# Patient Record
Sex: Male | Born: 1935 | Race: White | Hispanic: No | Marital: Married | Smoking: Former smoker
Health system: Southern US, Community
[De-identification: ages and names within clinical notes are randomized; demographics above are authoritative.]

## PROBLEM LIST (undated history)

## (undated) DIAGNOSIS — R296 Repeated falls: Secondary | ICD-10-CM

## (undated) DIAGNOSIS — Z972 Presence of dental prosthetic device (complete) (partial): Secondary | ICD-10-CM

## (undated) DIAGNOSIS — I10 Essential (primary) hypertension: Secondary | ICD-10-CM

## (undated) DIAGNOSIS — W19XXXA Unspecified fall, initial encounter: Secondary | ICD-10-CM

## (undated) DIAGNOSIS — N200 Calculus of kidney: Secondary | ICD-10-CM

## (undated) DIAGNOSIS — I5032 Chronic diastolic (congestive) heart failure: Secondary | ICD-10-CM

## (undated) DIAGNOSIS — J449 Chronic obstructive pulmonary disease, unspecified: Secondary | ICD-10-CM

## (undated) DIAGNOSIS — G5793 Unspecified mononeuropathy of bilateral lower limbs: Secondary | ICD-10-CM

## (undated) DIAGNOSIS — Z87442 Personal history of urinary calculi: Secondary | ICD-10-CM

## (undated) DIAGNOSIS — E785 Hyperlipidemia, unspecified: Secondary | ICD-10-CM

## (undated) DIAGNOSIS — E119 Type 2 diabetes mellitus without complications: Secondary | ICD-10-CM

## (undated) DIAGNOSIS — C801 Malignant (primary) neoplasm, unspecified: Secondary | ICD-10-CM

## (undated) DIAGNOSIS — M1990 Unspecified osteoarthritis, unspecified site: Secondary | ICD-10-CM

## (undated) DIAGNOSIS — I482 Chronic atrial fibrillation, unspecified: Secondary | ICD-10-CM

## (undated) DIAGNOSIS — I6350 Cerebral infarction due to unspecified occlusion or stenosis of unspecified cerebral artery: Secondary | ICD-10-CM

## (undated) DIAGNOSIS — I2510 Atherosclerotic heart disease of native coronary artery without angina pectoris: Secondary | ICD-10-CM

## (undated) DIAGNOSIS — R2689 Other abnormalities of gait and mobility: Secondary | ICD-10-CM

## (undated) DIAGNOSIS — R29898 Other symptoms and signs involving the musculoskeletal system: Secondary | ICD-10-CM

## (undated) DIAGNOSIS — K219 Gastro-esophageal reflux disease without esophagitis: Secondary | ICD-10-CM

## (undated) HISTORY — DX: Unspecified mononeuropathy of bilateral lower limbs: G57.93

## (undated) HISTORY — DX: Atherosclerotic heart disease of native coronary artery without angina pectoris: I25.10

## (undated) HISTORY — DX: Chronic obstructive pulmonary disease, unspecified: J44.9

## (undated) HISTORY — DX: Chronic atrial fibrillation, unspecified: I48.20

## (undated) HISTORY — DX: Malignant (primary) neoplasm, unspecified: C80.1

## (undated) HISTORY — DX: Calculus of kidney: N20.0

## (undated) HISTORY — DX: Hyperlipidemia, unspecified: E78.5

## (undated) HISTORY — PX: OTHER SURGICAL HISTORY: SHX169

## (undated) HISTORY — PX: BLADDER SURGERY: SHX569

## (undated) HISTORY — DX: Cerebral infarction due to unspecified occlusion or stenosis of unspecified cerebral artery: I63.50

## (undated) HISTORY — DX: Type 2 diabetes mellitus without complications: E11.9

## (undated) HISTORY — DX: Chronic diastolic (congestive) heart failure: I50.32

## (undated) HISTORY — DX: Personal history of urinary calculi: Z87.442

## (undated) HISTORY — DX: Unspecified osteoarthritis, unspecified site: M19.90

## (undated) HISTORY — DX: Essential (primary) hypertension: I10

## (undated) HISTORY — DX: Gastro-esophageal reflux disease without esophagitis: K21.9

---

## 1957-04-12 HISTORY — PX: TONSILLECTOMY AND ADENOIDECTOMY: SHX28

## 1995-04-13 HISTORY — PX: CORONARY ANGIOPLASTY: SHX604

## 1995-04-13 HISTORY — PX: CARDIAC CATHETERIZATION: SHX172

## 1995-04-13 HISTORY — PX: CAROTID STENT INSERTION: SHX5766

## 1999-10-09 ENCOUNTER — Ambulatory Visit (HOSPITAL_COMMUNITY): Admission: RE | Admit: 1999-10-09 | Discharge: 1999-10-09 | Payer: Self-pay

## 2004-06-15 ENCOUNTER — Ambulatory Visit: Payer: Self-pay | Admitting: Cardiology

## 2004-07-09 ENCOUNTER — Ambulatory Visit: Payer: Self-pay | Admitting: Internal Medicine

## 2004-10-09 ENCOUNTER — Ambulatory Visit: Payer: Self-pay | Admitting: Cardiology

## 2005-01-13 ENCOUNTER — Ambulatory Visit: Payer: Self-pay | Admitting: Internal Medicine

## 2005-02-17 ENCOUNTER — Ambulatory Visit: Payer: Self-pay | Admitting: Internal Medicine

## 2005-03-09 ENCOUNTER — Ambulatory Visit: Payer: Self-pay | Admitting: Internal Medicine

## 2005-03-12 ENCOUNTER — Ambulatory Visit: Payer: Self-pay | Admitting: Internal Medicine

## 2005-04-12 ENCOUNTER — Ambulatory Visit: Payer: Self-pay | Admitting: Internal Medicine

## 2005-05-20 ENCOUNTER — Ambulatory Visit: Payer: Self-pay | Admitting: Internal Medicine

## 2005-05-25 ENCOUNTER — Ambulatory Visit: Payer: Self-pay | Admitting: Internal Medicine

## 2005-06-10 ENCOUNTER — Ambulatory Visit: Payer: Self-pay | Admitting: Internal Medicine

## 2005-06-10 ENCOUNTER — Ambulatory Visit: Payer: Self-pay | Admitting: Cardiology

## 2005-06-18 ENCOUNTER — Ambulatory Visit: Payer: Self-pay

## 2005-07-11 ENCOUNTER — Ambulatory Visit: Payer: Self-pay | Admitting: Internal Medicine

## 2005-08-10 ENCOUNTER — Ambulatory Visit: Payer: Self-pay | Admitting: Internal Medicine

## 2005-09-23 ENCOUNTER — Ambulatory Visit: Payer: Self-pay | Admitting: Internal Medicine

## 2005-10-08 ENCOUNTER — Emergency Department: Payer: Self-pay | Admitting: Emergency Medicine

## 2005-10-10 ENCOUNTER — Ambulatory Visit: Payer: Self-pay | Admitting: Internal Medicine

## 2005-11-10 ENCOUNTER — Ambulatory Visit: Payer: Self-pay | Admitting: Internal Medicine

## 2006-01-21 ENCOUNTER — Ambulatory Visit: Payer: Self-pay | Admitting: Internal Medicine

## 2006-02-01 ENCOUNTER — Ambulatory Visit: Payer: Self-pay | Admitting: Specialist

## 2006-02-10 ENCOUNTER — Ambulatory Visit: Payer: Self-pay | Admitting: Internal Medicine

## 2006-03-12 ENCOUNTER — Ambulatory Visit: Payer: Self-pay | Admitting: Internal Medicine

## 2006-04-01 ENCOUNTER — Ambulatory Visit: Payer: Self-pay | Admitting: Cardiology

## 2006-04-12 ENCOUNTER — Ambulatory Visit: Payer: Self-pay | Admitting: Internal Medicine

## 2006-04-13 ENCOUNTER — Encounter: Payer: Self-pay | Admitting: Cardiology

## 2006-04-13 ENCOUNTER — Ambulatory Visit: Payer: Self-pay

## 2006-05-13 ENCOUNTER — Ambulatory Visit: Payer: Self-pay | Admitting: Internal Medicine

## 2006-05-31 ENCOUNTER — Ambulatory Visit: Payer: Self-pay | Admitting: Internal Medicine

## 2006-06-17 ENCOUNTER — Ambulatory Visit: Payer: Self-pay | Admitting: Internal Medicine

## 2006-07-12 ENCOUNTER — Ambulatory Visit: Payer: Self-pay | Admitting: Internal Medicine

## 2006-08-11 ENCOUNTER — Ambulatory Visit: Payer: Self-pay | Admitting: Internal Medicine

## 2006-09-11 ENCOUNTER — Ambulatory Visit: Payer: Self-pay | Admitting: Internal Medicine

## 2006-09-29 ENCOUNTER — Ambulatory Visit: Payer: Self-pay | Admitting: Cardiology

## 2006-10-11 ENCOUNTER — Ambulatory Visit: Payer: Self-pay | Admitting: Internal Medicine

## 2006-11-11 ENCOUNTER — Ambulatory Visit: Payer: Self-pay | Admitting: Internal Medicine

## 2006-12-12 ENCOUNTER — Ambulatory Visit: Payer: Self-pay | Admitting: Internal Medicine

## 2007-01-24 ENCOUNTER — Ambulatory Visit: Payer: Self-pay | Admitting: Family

## 2007-01-24 ENCOUNTER — Encounter: Payer: Self-pay | Admitting: Internal Medicine

## 2007-02-02 ENCOUNTER — Ambulatory Visit: Payer: Self-pay | Admitting: Internal Medicine

## 2007-02-11 ENCOUNTER — Encounter: Payer: Self-pay | Admitting: Internal Medicine

## 2007-02-11 ENCOUNTER — Ambulatory Visit: Payer: Self-pay | Admitting: Internal Medicine

## 2007-03-13 ENCOUNTER — Ambulatory Visit: Payer: Self-pay | Admitting: Internal Medicine

## 2007-03-13 ENCOUNTER — Encounter: Payer: Self-pay | Admitting: Internal Medicine

## 2007-03-16 ENCOUNTER — Ambulatory Visit: Payer: Self-pay | Admitting: Internal Medicine

## 2007-04-13 ENCOUNTER — Ambulatory Visit: Payer: Self-pay | Admitting: Internal Medicine

## 2007-04-21 ENCOUNTER — Ambulatory Visit: Payer: Self-pay | Admitting: Cardiology

## 2007-05-08 ENCOUNTER — Ambulatory Visit: Payer: Self-pay | Admitting: Family

## 2007-08-08 ENCOUNTER — Ambulatory Visit: Payer: Self-pay | Admitting: Family

## 2007-08-23 ENCOUNTER — Ambulatory Visit: Payer: Self-pay | Admitting: Internal Medicine

## 2007-11-29 ENCOUNTER — Ambulatory Visit: Payer: Self-pay | Admitting: Pain Medicine

## 2007-12-01 ENCOUNTER — Ambulatory Visit: Payer: Self-pay | Admitting: Family

## 2007-12-01 ENCOUNTER — Ambulatory Visit: Payer: Self-pay | Admitting: Pain Medicine

## 2007-12-20 ENCOUNTER — Ambulatory Visit: Payer: Self-pay | Admitting: Pain Medicine

## 2008-01-08 ENCOUNTER — Ambulatory Visit: Payer: Self-pay | Admitting: Pain Medicine

## 2008-01-24 ENCOUNTER — Ambulatory Visit: Payer: Self-pay | Admitting: Pain Medicine

## 2008-02-14 ENCOUNTER — Encounter: Payer: Self-pay | Admitting: Neurosurgery

## 2008-02-28 ENCOUNTER — Ambulatory Visit: Payer: Self-pay | Admitting: Pain Medicine

## 2008-02-29 ENCOUNTER — Ambulatory Visit: Payer: Self-pay | Admitting: Cardiology

## 2008-03-20 ENCOUNTER — Encounter: Payer: Self-pay | Admitting: Neurosurgery

## 2008-04-10 ENCOUNTER — Ambulatory Visit: Payer: Self-pay | Admitting: Family

## 2008-04-12 ENCOUNTER — Encounter: Payer: Self-pay | Admitting: Neurosurgery

## 2008-04-17 ENCOUNTER — Ambulatory Visit: Payer: Self-pay | Admitting: Pain Medicine

## 2008-06-20 ENCOUNTER — Ambulatory Visit: Payer: Self-pay | Admitting: Family

## 2008-07-16 ENCOUNTER — Ambulatory Visit: Payer: Self-pay | Admitting: Physician Assistant

## 2008-09-25 ENCOUNTER — Ambulatory Visit: Payer: Self-pay | Admitting: Family

## 2008-10-15 ENCOUNTER — Ambulatory Visit: Payer: Self-pay | Admitting: Physician Assistant

## 2008-12-03 ENCOUNTER — Ambulatory Visit: Payer: Self-pay | Admitting: Specialist

## 2008-12-06 ENCOUNTER — Encounter (INDEPENDENT_AMBULATORY_CARE_PROVIDER_SITE_OTHER): Payer: Self-pay | Admitting: *Deleted

## 2009-01-02 ENCOUNTER — Ambulatory Visit: Payer: Self-pay | Admitting: Family

## 2009-01-07 ENCOUNTER — Ambulatory Visit: Payer: Self-pay | Admitting: Physician Assistant

## 2009-02-26 DIAGNOSIS — E119 Type 2 diabetes mellitus without complications: Secondary | ICD-10-CM

## 2009-02-26 DIAGNOSIS — I639 Cerebral infarction, unspecified: Secondary | ICD-10-CM

## 2009-02-26 DIAGNOSIS — I080 Rheumatic disorders of both mitral and aortic valves: Secondary | ICD-10-CM

## 2009-02-26 DIAGNOSIS — I25118 Atherosclerotic heart disease of native coronary artery with other forms of angina pectoris: Secondary | ICD-10-CM

## 2009-02-26 DIAGNOSIS — I219 Acute myocardial infarction, unspecified: Secondary | ICD-10-CM | POA: Insufficient documentation

## 2009-02-26 DIAGNOSIS — R609 Edema, unspecified: Secondary | ICD-10-CM | POA: Insufficient documentation

## 2009-02-26 DIAGNOSIS — I482 Chronic atrial fibrillation, unspecified: Secondary | ICD-10-CM

## 2009-02-26 DIAGNOSIS — E785 Hyperlipidemia, unspecified: Secondary | ICD-10-CM | POA: Insufficient documentation

## 2009-02-26 DIAGNOSIS — I10 Essential (primary) hypertension: Secondary | ICD-10-CM

## 2009-02-27 ENCOUNTER — Ambulatory Visit: Payer: Self-pay | Admitting: Cardiology

## 2009-04-09 ENCOUNTER — Ambulatory Visit: Payer: Self-pay | Admitting: Family

## 2009-04-10 ENCOUNTER — Ambulatory Visit: Payer: Self-pay | Admitting: Physician Assistant

## 2009-07-30 ENCOUNTER — Ambulatory Visit: Payer: Self-pay | Admitting: Family

## 2009-10-15 ENCOUNTER — Ambulatory Visit: Payer: Self-pay | Admitting: Family

## 2010-01-23 ENCOUNTER — Ambulatory Visit: Payer: Self-pay | Admitting: Family

## 2010-02-19 ENCOUNTER — Ambulatory Visit: Payer: Self-pay | Admitting: Family

## 2010-02-24 ENCOUNTER — Encounter: Payer: Self-pay | Admitting: Cardiology

## 2010-02-24 ENCOUNTER — Ambulatory Visit: Payer: Self-pay | Admitting: Cardiology

## 2010-05-12 NOTE — Assessment & Plan Note (Signed)
Summary: F1Y/DM   Visit Type:  2 week follow up  CC:  sob.  History of Present Illness: [name] is a pleasant gentleman, who has a history of coronary artery disease, status post myocardial infarction treated with TPA at [facility] in January 29, 1996.  His most recent Myoview in March 2007 showed an ejection fraction of 50% with an old scar at the apex and mild peri-infarct ischemia.  Most recent echocardiogram in January 2008 showed an ejection fraction at the lower limits of normal with moderate LVH, mild aortic root dilatation, and mild mitral regurgitation.  There was biatrial enlargement. Previous carotid Dopplers in January of 2008 showed 0-39% stenosis bilaterally.  I last saw him in November of 2010. Since then he has dyspnea with more extreme activities but not with routine activities. It is relieved with rest. It is not associated with chest pain. There is no orthopnea or PND but does occasional mild pedal edema. He has not had palpitations, syncope or bleeding.  Current Medications (verified): 1)  Aspirin 81 Mg Tbec (Aspirin) .... Take One Tablet By Mouth Daily 2)  Lumigan 0.03 % Soln (Bimatoprost) .... As Directed 3)  Isosorbide Dinitrate Cr 40 Mg Cr-Tabs (Isosorbide Dinitrate) .... Take One Capsule By Mouth Three Times A Day 4)  Spiriva Handihaler 18 Mcg Caps (Tiotropium Bromide Monohydrate) .... As Directed 5)  Klor-Con 10 10 Meq Cr-Tabs (Potassium Chloride) .... Alternating 2 One Day Then 3 The Next Day 6)  Advair Diskus 100-50 Mcg/dose Aepb (Fluticasone-Salmeterol) .... As Directed 7)  Proair Hfa 108 (90 Base) Mcg/act Aers (Albuterol Sulfate) .... As Directed 8)  Glimepiride 2 Mg Tabs (Glimepiride) .Marland Kitchen.. 1 Tab By Mouth Two Times A Day 9)  Lipitor 40 Mg Tabs (Atorvastatin Calcium) .... Take One Tablet By Mouth Daily. 10)  Lisinopril 40 Mg Tabs (Lisinopril) .... Take One Tablet By Mouth Daily 11)  Metformin Hcl 1000 Mg Tabs (Metformin Hcl) .Marland Kitchen.. 1 Tab By Mouth Two Times A  Day 12)  Celexa 10 Mg Tabs (Citalopram Hydrobromide) .Marland Kitchen.. 1 Tab By Mouth Once Daily 13)  Gabapentin 100 Mg Caps (Gabapentin) .Marland Kitchen.. 1 Tab By Mouth Three Times A Day 14)  Metoprolol Succinate 50 Mg Xr24h-Tab (Metoprolol Succinate) .... 2 Tabs Morning and 1 Tab By Mouth At Bedtime 15)  Furosemide 40 Mg Tabs (Furosemide) .... Take One Tablet By Mouth Daily. 16)  Januvia 100 Mg Tabs (Sitagliptin Phosphate) .Marland Kitchen.. 1 Tab By Mouth Once Daily 17)  Warfarin Sodium 2 Mg Tabs (Warfarin Sodium) .... As Directed 18)  Nasonex 50 Mcg/act Susp (Mometasone Furoate) .... As Directed 19)  Hydrocodone-Acetaminophen 5-500 Mg Tabs (Hydrocodone-Acetaminophen) .... As Needed  Past History:  Past Medical History: CVA (ICD-434.91) HYPERLIPIDEMIA (ICD-272.4) HYPERTENSION (ICD-401.9) ATRIAL FIBRILLATION (ICD-427.31) MI (ICD-410.90) CAD (ICD-414.00) MITRAL REGURGITATION (ICD-396.3) COPD DM (ICD-250.00) COUMADIN THERAPY (ICD-V58.61) obstructive sleep apnea  Social History: Reviewed history from 02/27/2009 and no changes required. Married  Tobacco Use - Former.  Alcohol Use - no  Review of Systems       no fevers or chills, productive cough, hemoptysis, dysphasia, odynophagia, melena, hematochezia, dysuria, hematuria, rash, seizure activity, orthopnea, PND, pedal edema, claudication. Remaining systems are negative.   Vital Signs:  Patient profile:   77 year old male Height:      66 inches Weight:      186 pounds BMI:     30.13 Pulse rate:   71 / minute Resp:     14 per minute BP sitting:   130 / 85  (  left arm)  Vitals Entered By: Burnett Kanaris (February 24, 2010 1:32 PM)  Physical Exam  General:  Well-developed well-nourished in no acute distress.  Skin is warm and dry.  HEENT is normal.  Neck is supple. No thyromegaly.  Chest is clear to auscultation with normal expansion.  Cardiovascular exam is regular rate and rhythm.  Abdominal exam nontender or distended. No masses  palpated. Extremities show no edema. neuro grossly intact    EKG  Procedure date:  02/24/2010  Findings:      Atrial fibrillation, prior anterior infarct, nonspecific ST changes.  Impression & Recommendations:  Problem # 1:  CVA (ICD-434.91) Patient will need lifelong Coumadin given history of atrial fibrillation and CVA. His updated medication list for this problem includes:    Aspirin 81 Mg Tbec (Aspirin) .Marland Kitchen... Take one tablet by mouth daily    Warfarin Sodium 2 Mg Tabs (Warfarin sodium) .Marland Kitchen... As directed  Problem # 2:  HYPERLIPIDEMIA (P102836.4) Continue statin. Lipid and liver monitored by primary care. His updated medication list for this problem includes:    Lipitor 40 Mg Tabs (Atorvastatin calcium) .Marland Kitchen... Take one tablet by mouth daily.  Problem # 3:  HYPERTENSION (ICD-401.9) Blood pressure controlled on present medications. Will continue. Potassium and renal function monitored by primary care. His updated medication list for this problem includes:    Aspirin 81 Mg Tbec (Aspirin) .Marland Kitchen... Take one tablet by mouth daily    Lisinopril 40 Mg Tabs (Lisinopril) .Marland Kitchen... Take one tablet by mouth daily    Metoprolol Succinate 50 Mg Xr24h-tab (Metoprolol succinate) .Marland Kitchen... 2 tabs morning and 1 tab by mouth at bedtime    Furosemide 40 Mg Tabs (Furosemide) .Marland Kitchen... Take one tablet by mouth daily.  Problem # 4:  ATRIAL FIBRILLATION (ICD-427.31) Continue beta blocker and Coumadin. His updated medication list for this problem includes:    Aspirin 81 Mg Tbec (Aspirin) .Marland Kitchen... Take one tablet by mouth daily    Metoprolol Succinate 50 Mg Xr24h-tab (Metoprolol succinate) .Marland Kitchen... 2 tabs morning and 1 tab by mouth at bedtime    Warfarin Sodium 2 Mg Tabs (Warfarin sodium) .Marland Kitchen... As directed  Problem # 5:  CAD (ICD-414.00) Continue aspirin, beta blocker and statin. He would only agree to cardiac catheterization under life-threatening circumstances. Therefore we'll not pursue functional studies. His  updated medication list for this problem includes:    Aspirin 81 Mg Tbec (Aspirin) .Marland Kitchen... Take one tablet by mouth daily    Isosorbide Dinitrate Cr 40 Mg Cr-tabs (Isosorbide dinitrate) .Marland Kitchen... Take one capsule by mouth three times a day    Lisinopril 40 Mg Tabs (Lisinopril) .Marland Kitchen... Take one tablet by mouth daily    Metoprolol Succinate 50 Mg Xr24h-tab (Metoprolol succinate) .Marland Kitchen... 2 tabs morning and 1 tab by mouth at bedtime    Warfarin Sodium 2 Mg Tabs (Warfarin sodium) .Marland Kitchen... As directed  Problem # 6:  DM (ICD-250.00)  His updated medication list for this problem includes:    Aspirin 81 Mg Tbec (Aspirin) .Marland Kitchen... Take one tablet by mouth daily    Glimepiride 2 Mg Tabs (Glimepiride) .Marland Kitchen... 1 tab by mouth two times a day    Lisinopril 40 Mg Tabs (Lisinopril) .Marland Kitchen... Take one tablet by mouth daily    Metformin Hcl 1000 Mg Tabs (Metformin hcl) .Marland Kitchen... 1 tab by mouth two times a day    Januvia 100 Mg Tabs (Sitagliptin phosphate) .Marland Kitchen... 1 tab by mouth once daily  Problem # 7:  COUMADIN THERAPY (ICD-V58.61) Monitored by primary care.  Patient  Instructions: 1)  Your physician wants you to follow-up in:ONE YEAR   You will receive a reminder letter in the mail two months in advance. If you don't receive a letter, please call our office to schedule the follow-up appointment.

## 2010-05-21 ENCOUNTER — Ambulatory Visit: Payer: Self-pay | Admitting: Family

## 2010-08-25 NOTE — Assessment & Plan Note (Signed)
[facility]                            CARDIOLOGY OFFICE NOTE   NAME:[name], [name]                     MRN:          [id number]  DATE:02/29/2008                            DOB:          23-Nov-1931    Mr. [name] is a pleasant gentleman, who has a history of coronary  artery disease, status post myocardial infarction treated with TPA at  [facility] in January 29, 1996.  His most recent Myoview in March  2007 showed an ejection fraction of 50% with an old scar at the apex and  mild peri-infarct ischemia.  Most recent echocardiogram in January 2008  showed an ejection fraction at the lower limits of normal with moderate  LVH, mild aortic root dilatation, and mild mitral regurgitation.  There  was biatrial enlargement.  Since I last saw him, he has dyspnea when he  does not take his COPD medications.  He has dyspnea with more extreme  activities, but not with routine activities.  There is no orthopnea,  PND, but there is chronic pedal edema which is unchanged.  There is no  chest pain, palpitations, or syncope.   CURRENT MEDICATIONS:  1. Lasix 60 mg p.o. daily.  2. Aspirin 81 mg p.o. daily.  3. Lisinopril 40 mg p.o. daily.  4. Coumadin as directed.  5. Metformin 1000 mg p.o. b.i.d.  6. Isordil 40 mg p.o. t.i.d.  7. Advair Diskus.  8. Albuterol.  9. Toprol 50 mg p.o. b.i.d.  10.Lipitor 40 mg p.o. daily.  11.Spiriva.  12.Fexofenadine.  13.Fish oil.  14.He also takes eye drops.  15.Amaryl 2 mg p.o. b.i.d.  16.Potassium 3 tablets alternate with 2 tablets every other day.  17.Glimepiride 2 mg p.o. b.i.d.  18.Hydrocodone.  19.Neurontin.   PHYSICAL EXAMINATION:  VITAL SIGNS:  Blood pressure of 147/74.  His  pulse is 79.  HEENT:  Normal.  NECK:  Supple.  CHEST:  Clear.  CARDIOVASCULAR:  Irregular.  ABDOMEN:  No tenderness.  EXTREMITIES:  Trace edema.   Electrocardiogram shows atrial fibrillation at a rate 66.  There is a  prior  anterior infarct.  There is lateral T-wave inversion.   DIAGNOSES:  1. Coronary artery disease, status post coronary artery bypass graft -      [name] has had no chest pain.  His last Myoview was at low      risk.  We will continue with medical therapy including his aspirin,      ACE inhibitor, beta-blockers, and statin.  Note, he has never been      interested in pursuing no other cardiac catheterization unless it      is an emergency such as an acute infarct.  We will plan to be      conservative if possible.  2. Permanent atrial fibrillation - continue on his Toprol for rate      control as well as Coumadin with a goal INR of 2-3.  This is being      monitored by Dr. Nicki Reaper.  3. Hypertension - his blood pressures are adequately controlled.  4. Hyperlipidemia - he will continue  on statin and Dr. Nicki Reaper is      following his laboratories.  5. Diabetes mellitus - management per his primary care physician.  6. History of cerebrovascular accident.  7. Coumadin therapy.   We will see him back in 12 months.     Denice Bors Stanford Breed, MD, [facility]  Electronically Signed    BSC/MedQ  DD: 02/29/2008  DT: 03/01/2008  Job #: [id number]   cc:   Einar Pheasant

## 2010-08-25 NOTE — Assessment & Plan Note (Signed)
[facility] OFFICE NOTE   NAME:[name], Shawn WILMOTT                     MRN:          [id number]  DATE:09/29/2006                            DOB:          1931-05-17    [name] is a pleasant gentleman with a history of coronary disease.  His most recent nuclear study was performed in March 2007 that showed an  ejection fraction of 50%.  He had an apical scar with mild peri-infarct  ischemia.  We have treated medically, as he does not want to have  cardiac catheterization unless he is having a heart attack or having  chest pain.  His most recent echocardiogram was performed in January.  At that time, he was found to have an ejection fraction of 50-55%.  There was moderate LVH.  There was mild aortic root dilatation.  There  was mild mitral regurgitation and tricuspid regurgitation.  There was  marked left atrial enlargement and mild right atrial enlargement.  Since  I last saw him, he has mild dyspnea with more extreme activities, but  not with routine activities around the house.  There is no orthopnea or  PND, but he has noticed worsening pedal edema over the past 8 to 9  months.  Note, this appears to correlate with the initiation of Actos.  There is no chest pain, palpitations, or syncope.   MEDICATIONS AT PRESENT:  1. Lasix 60 mg p.o. daily.  2. Amaryl 2 mg p.o. daily.  3. Aspirin 81 mg p.o. daily.  4. Lisinopril 40 mg p.o. daily.  5. Zyrtec 10 mg p.o. daily.  6. Coumadin as directed.  7. Metformin 1000 mg p.o. b.i.d.  8. Isosorbide 40 mg p.o. t.i.d.  9. Advair Diskus.  10.Albuterol.  11.Toprol 50 mg p.o. b.i.d.  12.Lipitor 40 mg p.o. daily.  13.Spiriva 18 mcg p.o. daily.  14.Fexofenadine 60 mg p.o. daily.  15.Actos 15 mg p.o. daily.  16.Fish oil.  17.Potassium 10 mEq tablet 1 p.o. b.i.d.  18.Eye drops.   PHYSICAL EXAM:  Blood pressure 162/88, pulse 61.  HEENT:  Normal.  NECK:  Supple and there  are no bruits noted.  CHEST:  Clear.  CARDIOVASCULAR:  Irregular rhythm.  I cannot appreciate murmurs, rubs,  or gallops.  ABDOMEN:  Benign.  EXTREMITIES:  1+ edema to the mid tibia bilaterally.   ELECTROCARDIOGRAM:  Atrial fibrillation at a rate of 61.  Prior anterior  infarct cannot be excluded.  There are nonspecific lateral ST changes.   DIAGNOSES:  1. Permanent atrial fibrillation - his heart rate is controlled, and      we will continue with his Toprol at his present dose.  He will also      continue on Coumadin with a goal INR of 2 to 3, which is being      monitored in [place].  2. Coronary artery disease - the patient's last nuclear study was a      little over a year ago.  It was felt to be low risk at that time.      It should also  be noted the patient states that he will not have a      cardiac catheterization unless he is having a myocardial infarction      or unless he is developing chest pain.  He also states that      regardless of how high-risk a nuclear study would be.  We will,      therefore, continue his medical therapy.  He will continue with his      aspirin, Statin, beta blocker, and ACE inhibitor.  3. Pedal edema - he does demonstrate pedal edema at present.  I will      check a BMET today to follow his potassium and renal function, as      well as a BNP.  This appears to correlate with the initiation of      Actos, which can cause volume overload.  He discontinued this      yesterday on his own, and I have asked him to follow up with Dr.      Nicki Reaper concerning possible other medications for his diabetes that      would not cause volume excess.  4. Hypertension - his blood pressure is elevated today, but he states      it is typically well-controlled in the 120 to 130 over 60 to 70      range.  He will continue to tract this and his medications can be      adjusted as indicated.  5. Hyperlipidemia - he will continue on his Lipitor and Dr. Nicki Reaper is       following his lipids.  Our goal LDL should be less than 70 given      his history of coronary artery disease.  6. Diabetes mellitus - per Dr. Nicki Reaper.  7. Moderate to severe mitral regurgitation by outside echocardiogram      in the past - his most recent echocardiogram showed mild mitral      regurgitation.  8. Coumadin therapy - goal INR 2 to 3.  this is being followed in      [place].  9. History of cerebrovascular accident.   We will see him back in 6 months.  Note, his carotid Dopplers in January  showed 0 to 39% stenosis.     Denice Bors Stanford Breed, MD, [facility]  Electronically Signed    BSC/MedQ  DD: 09/29/2006  DT: 09/29/2006  Job #: [id number]   cc:   Einar Pheasant

## 2010-08-25 NOTE — Assessment & Plan Note (Signed)
[facility] OFFICE NOTE   NAME:Si, ZIYANG ORIO                     MRN:          [id number]  DATE:04/21/2007                            DOB:          12-26-1931    Mr. Stansbery is a very pleasant gentleman who has a history of coronary  disease.  He is status post myocardial infarction treated with tPA at  [facility] in October of 1997.  His most recent Myoview is on June 18, 2005.  At that time, his ejection fraction was 50%.  There was an old  scar at the apex with mild peri infarct ischemia.  His most recent  echocardiogram was on April 13, 2006.  His left ventricular function  was at the lower limits of normal with moderate left ventricular  hypertrophy.  There was mild aortic root dilatation and mild mitral  regurgitation.  There was biatrial enlargement.  Since he was last seen  he is doing well.  He denies any increased dyspnea on exertion,  orthopnea, or PND.  His pedal edema has improved.  There is no chest  pain, palpitations, or syncope, and there is no bleeding.   MEDICATIONS:  1. Lasix 60 mg p.o. daily.  2. Aspirin 81 mg p.o. daily.  3. Lisinopril 40 mg p.o. daily.  4. Zyrtec 10 mg p.o. daily.  5. Coumadin as directed.  6. Metformin 1000 mg p.o. b.i.d.  7. Isosorbide 40 mg p.o. t.i.d.  8. Advair Diskus.  9. Albuterol.  10.Toprol 50 mg p.o. b.i.d.  11.Lipitor 40 mg p.o. daily.  12.Spiriva 18 mcg p.o. daily.  13.Fexofenadine.  14.Actos has been discontinued.  15.Fish oil.  16.Potassium 10 mEq tablets, 1 in the morning and 2 in the evening.  17.He is also using eye drops.  18.Amaryl 2 mg p.o. b.i.d.   PHYSICAL EXAMINATION:  Shows a blood pressure 140/88 and his pulse is  83.  He weighs 206 pounds.  His HEENT is normal.  His neck is supple.  His chest is clear.  His cardiovascular exam reveals an irregular rhythm.  His abdominal exam shows some distension, but there is no  tenderness.  EXTREMITIES:  Show trace to 1+ edema.   Electrocardiogram shows atrial fibrillation at a rate of 83.  There is a  prior anterior infarct, and there are nonspecific ST changes.  It is  unchanged from previous.   DIAGNOSES:  1. Permanent atrial fibrillation:  He will continue on his Toprol for      rate control as well as Coumadin with a goal INR of 2 to 3.  His      Coumadin is being monitored in [place].  2. Coronary artery disease:  He will continue on his aspirin, statin,      angiotensin-converting enzyme inhibitor, and beta blocker.  His      most recent Myoview is low risk, and he is not interested in      pursuing cardiac catheterization unless he is having an acute      infarct or ongoing chest pain.  He has been very clear  about that      in the past.  3. Hypertension:  His blood pressure is mildly elevated today.  We      discussed the possibility of adding calcium blocker, but he is not      interested in pursuing this.  He states he ate significant amounts      of salt over the holidays, and he would prefer to adjust his diet.      He will track this at home and we will add medications as      indicated.  4. Hyperlipidemia:  He will continue on the statin, and Dr. Nicki Reaper is      following his lipids and liver.  5. Diabetes mellitus:  Per his primary care physician.  6. Coumadin therapy:  Followed in [place] with a goal INR of 2 to      3.  7. History of CVA.   We will see him back in 9 months.     Denice Bors Stanford Breed, MD, [facility]  Electronically Signed    BSC/MedQ  DD: 04/21/2007  DT: 04/21/2007  Job #: [id number]   cc:   Einar Pheasant

## 2010-08-28 NOTE — Assessment & Plan Note (Signed)
[facility] HEALTHCARE                            CARDIOLOGY OFFICE NOTE   NAME:Clawson, ARISON BEECROFT                     MRN:          [id number]  DATE:04/01/2006                            DOB:          1931-09-30    Mr. Flaig is a pleasant gentleman with a history of coronary disease.  Please refer to my previous notes for details.  His most recent nuclear  study in March 2007, showed an ejection fraction of 50%.  There was a  previous scar at the apex with mild peri-infarct ischemia.  We have  treated medically as he has not wanted to have a heart catheterization.  Note, that he also has had previous echocardiograms with his most recent  being performed in February.  He was found to have low normal LV  function.  There was moderate biatrial enlargement.  There was moderate  to severe mitral regurgitation and moderate to severe tricuspid  regurgitation per interpretation in [place].  Since I last saw him,  he does have mild dyspnea with more extreme exertion but not with  routine activities.  There is no orthopnea, PND, pedal edema,  palpitations, presyncope, syncope, or chest pain.   MEDICATIONS:  1. Spiriva.  2. Fexofenadine.  3. Actos.  4. Fish oil.  5. Lasix 40 mg p.o. every day.  6. Amaryl 2 mg p.o. every day.  7. Aspirin 81 mg p.o. every day.  8. Lisinopril 40 mg p.o. every day.  9. Zyrtec.  10.Coumadin as directed.  11.Metformin 1,000 mg p.o. b.i.d.  12.Isosorbide 40 mg p.o. t.i.d.  13.Potassium 10 mEq p.o. b.i.d.  14.Eye drops.  15.Advair Diskus.  16.Albuterol.  17.Toprol 50 mg p.o. b.i.d.  18.Lipitor 40 mg p.o. every day.   PHYSICAL EXAMINATION:  VITAL SIGNS:  Show a blood pressure of 120/80 and  his pulse is 67.  He weighs 204 pounds.  NECK:  Supple.  CHEST:  Clear.  CARDIOVASCULAR:  Reveals an irregular rhythm.  I cannot appreciate  murmurs.  EXTREMITIES:  Show trace edema.   Electrocardiogram shows atrial fibrillation with  controlled ventricular  response.  The rate is 67.  There is a prior septal infarct and a prior  inferior infarct cannot be excluded.  There are nonspecific ST changes.   DIAGNOSES:  1. Permanent atrial fibrillation.  2. Coronary disease.  3. Hypertension.  4. Hyperlipidemia.  5. Diabetes mellitus.  6. Moderate to severe mitral regurgitation by outside echocardiogram.  7. Coumadin therapy.  8. History of cerebrovascular accident.   PLAN:  Mr. Ramires appears to be doing reasonably well from a  symptomatic standpoint.  His recent nuclear study in February was low  risk and he is not interested in having a heart catheterization as  described in my previous notes.  We will continue with medical therapy.  I will plan to repeat his echocardiogram to reassess his mitral  regurgitation as this may need repair in the future.  We will repeat his  carotid Dopplers to follow up on his cerebrovascular disease.  He will  continue with his present medications and his lipids, liver, and  renal  function as well as Coumadin are being followed in [place].  I will  see him back in [place] in approximately nine months.     Denice Bors Stanford Breed, MD, [facility]  Electronically Signed    BSC/MedQ  DD: 04/01/2006  DT: 04/01/2006  Job #: [id number]   cc:   Einar Pheasant

## 2011-02-24 ENCOUNTER — Ambulatory Visit: Payer: Self-pay | Admitting: Family

## 2011-03-01 ENCOUNTER — Encounter: Payer: Self-pay | Admitting: *Deleted

## 2011-03-01 ENCOUNTER — Encounter: Payer: Self-pay | Admitting: Cardiology

## 2011-03-02 ENCOUNTER — Ambulatory Visit (INDEPENDENT_AMBULATORY_CARE_PROVIDER_SITE_OTHER): Payer: Medicare Other | Admitting: Cardiology

## 2011-03-02 ENCOUNTER — Encounter: Payer: Self-pay | Admitting: Cardiology

## 2011-03-02 DIAGNOSIS — I6350 Cerebral infarction due to unspecified occlusion or stenosis of unspecified cerebral artery: Secondary | ICD-10-CM

## 2011-03-02 DIAGNOSIS — E785 Hyperlipidemia, unspecified: Secondary | ICD-10-CM

## 2011-03-02 DIAGNOSIS — I4891 Unspecified atrial fibrillation: Secondary | ICD-10-CM

## 2011-03-02 DIAGNOSIS — I2510 Atherosclerotic heart disease of native coronary artery without angina pectoris: Secondary | ICD-10-CM

## 2011-03-02 DIAGNOSIS — I10 Essential (primary) hypertension: Secondary | ICD-10-CM

## 2011-03-02 NOTE — Assessment & Plan Note (Signed)
Continue statin. Lipids and liver monitored by primary care. 

## 2011-03-02 NOTE — Patient Instructions (Signed)
Your physician wants you to follow-up in: 1 year with Dr. Crenshaw. You will receive a reminder letter in the mail two months in advance. If you don't receive a letter, please call our office to schedule the follow-up appointment.  Your physician recommends that you continue on your current medications as directed. Please refer to the Current Medication list given to you today.  

## 2011-03-02 NOTE — Assessment & Plan Note (Signed)
Given history of CVA he will need lifelong Coumadin.

## 2011-03-02 NOTE — Assessment & Plan Note (Signed)
Continue present medications. Patient is clear that he would never consider cardiac catheterization unless there was a life-threatening event. Will therefore did not pursue further functional studies.

## 2011-03-02 NOTE — Assessment & Plan Note (Signed)
Blood pressure mildly elevated but he states normal at home. Continue present medications.

## 2011-03-02 NOTE — Assessment & Plan Note (Signed)
Continue medications for rate control. Continue Coumadin.

## 2011-03-02 NOTE — Progress Notes (Signed)
HPI:Mr. [name] is a pleasant gentleman, who has a history of coronary artery disease, status post myocardial infarction treated with TPA at [facility] in January 29, 1996.  His most recent Myoview in March 2007 showed an ejection fraction of 50% with an old scar at the apex and mild peri-infarct ischemia.  Most recent echocardiogram in January 2008 showed an ejection fraction at the lower limits of normal with moderate LVH, mild aortic root dilatation, and mild mitral regurgitation.  There was biatrial enlargement. Previous carotid Dopplers in January of 2008 showed 0-39% stenosis bilaterally.  I last saw him in November of 2011. Since then he has dyspnea with more extreme activities but not with routine activities. It is relieved with rest. It is not associated with chest pain. There is no orthopnea or PND but does occasional mild pedal edema. He has not had palpitations, syncope or bleeding.  Current Outpatient Prescriptions  Medication Sig Dispense Refill  . acetaminophen (TYLENOL) 650 MG CR tablet Take 650 mg by mouth every 8 (eight) hours as needed.        Marland Kitchen albuterol (PROVENTIL HFA;VENTOLIN HFA) 108 (90 BASE) MCG/ACT inhaler Inhale 2 puffs into the lungs every 6 (six) hours as needed.        Marland Kitchen aspirin 81 MG tablet Take 81 mg by mouth daily.        Marland Kitchen atorvastatin (LIPITOR) 40 MG tablet Take 40 mg by mouth daily.        . bimatoprost (LUMIGAN) 0.03 % ophthalmic solution 1 drop at bedtime.        . citalopram (CELEXA) 10 MG tablet Take 10 mg by mouth daily.        . fluticasone-salmeterol (ADVAIR HFA) 115-21 MCG/ACT inhaler Inhale 2 puffs into the lungs 2 (two) times daily.        . furosemide (LASIX) 20 MG tablet Take 20 mg by mouth daily.        Marland Kitchen glimepiride (AMARYL) 2 MG tablet Take 2 mg by mouth daily before breakfast.        . isosorbide dinitrate (ISORDIL) 40 MG tablet Take 40 mg by mouth 3 (three) times daily.        Marland Kitchen lisinopril (PRINIVIL,ZESTRIL) 40 MG tablet Take 40 mg by mouth  daily.        . metFORMIN (GLUCOPHAGE) 1000 MG tablet Take 1,000 mg by mouth 2 (two) times daily with a meal.        . metoprolol (TOPROL-XL) 50 MG 24 hr tablet Take 50 mg by mouth. 2 am and 1 pm       . mometasone (NASONEX) 50 MCG/ACT nasal spray Place 2 sprays into the nose daily.        . nitroGLYCERIN (NITROSTAT) 0.4 MG SL tablet Place 0.4 mg under the tongue every 5 (five) minutes as needed.        . potassium chloride (KLOR-CON) 10 MEQ CR tablet Take 10 mEq by mouth daily. 2 tablets one day 3 tablets the next day      . sitaGLIPtin (JANUVIA) 100 MG tablet Take 100 mg by mouth daily.        Marland Kitchen tiotropium (SPIRIVA) 18 MCG inhalation capsule Place 18 mcg into inhaler and inhale daily.        Marland Kitchen warfarin (COUMADIN) 2 MG tablet Take 2 mg by mouth as directed.           Past Medical History  Diagnosis Date  . MI   . MITRAL REGURGITATION   .  HYPERTENSION   . HYPERLIPIDEMIA   . CVA   . CAD   . Atrial fibrillation   . Edema   . DM     No past surgical history on file.  History   Social History  . Marital Status: Married    Spouse Name: N/A    Number of Children: N/A  . Years of Education: N/A   Occupational History  . Not on file.   Social History Main Topics  . Smoking status: Former Research scientist (life sciences)  . Smokeless tobacco: Not on file  . Alcohol Use: No  . Drug Use: No  . Sexually Active: Not on file   Other Topics Concern  . Not on file   Social History Narrative  . No narrative on file    ROS: no fevers or chills, productive cough, hemoptysis, dysphasia, odynophagia, melena, hematochezia, dysuria, hematuria, rash, seizure activity, orthopnea, PND, pedal edema, claudication. Remaining systems are negative.  Physical Exam: Well-developed well-nourished in no acute distress.  Skin is warm and dry.  HEENT is normal.  Neck is supple. No thyromegaly.  Chest is clear to auscultation with normal expansion.  Cardiovascular exam is irregular Abdominal exam nontender or  distended. No masses palpated. Extremities show trace edema. neuro grossly intact  ECG atrial fibrillation at a rate of 84. Lateral infarct. Septal infarct. Nonspecific ST changes.

## 2011-09-01 ENCOUNTER — Encounter: Payer: Self-pay | Admitting: Internal Medicine

## 2011-09-11 ENCOUNTER — Encounter: Payer: Self-pay | Admitting: Internal Medicine

## 2012-01-24 ENCOUNTER — Inpatient Hospital Stay: Payer: Self-pay | Admitting: Internal Medicine

## 2012-01-24 LAB — COMPREHENSIVE METABOLIC PANEL
Albumin: 3.2 g/dL — ABNORMAL LOW (ref 3.4–5.0)
Alkaline Phosphatase: 79 U/L (ref 50–136)
BUN: 14 mg/dL (ref 7–18)
Calcium, Total: 8.9 mg/dL (ref 8.5–10.1)
Chloride: 105 mmol/L (ref 98–107)
EGFR (Non-African Amer.): >60
Glucose: 211 mg/dL — ABNORMAL HIGH (ref 65–99)
SGOT(AST): 6 U/L — ABNORMAL LOW (ref 15–37)
SGPT (ALT): 19 U/L (ref 12–78)
Total Protein: 7.8 g/dL (ref 6.4–8.2)

## 2012-01-24 LAB — URINALYSIS, COMPLETE
Bilirubin,UR: NEGATIVE
Glucose,UR: 150 mg/dL (ref 0–75)
Ketone: NEGATIVE
Nitrite: NEGATIVE
RBC,UR: 11 /HPF (ref 0–5)
Squamous Epithelial: 2
WBC UR: 177 /HPF (ref 0–5)

## 2012-01-24 LAB — CBC
HCT: 43.3 % (ref 40.0–52.0)
HGB: 14.7 g/dL (ref 13.0–18.0)
MCH: 28.7 pg (ref 26.0–34.0)
MCHC: 33.9 g/dL (ref 32.0–36.0)
MCV: 85 fL (ref 80–100)
RDW: 16.1 % — ABNORMAL HIGH (ref 11.5–14.5)

## 2012-01-24 LAB — CK TOTAL AND CKMB (NOT AT ARMC): CK-MB: 0.8 ng/mL (ref 0.5–3.6)

## 2012-01-25 LAB — CBC WITH DIFFERENTIAL/PLATELET
Basophil #: 0.1 10*3/uL (ref 0.0–0.1)
Eosinophil %: 0.4 %
HCT: 43 % (ref 40.0–52.0)
Lymphocyte #: 1.4 10*3/uL (ref 1.0–3.6)
MCHC: 34.2 g/dL (ref 32.0–36.0)
MCV: 84 fL (ref 80–100)
Monocyte %: 9.4 %
Neutrophil #: 15.3 10*3/uL — ABNORMAL HIGH (ref 1.4–6.5)
Platelet: 189 10*3/uL (ref 150–440)
RBC: 5.1 10*6/uL (ref 4.40–5.90)
WBC: 18.6 10*3/uL — ABNORMAL HIGH (ref 3.8–10.6)

## 2012-01-26 LAB — URINE CULTURE

## 2012-01-26 LAB — PROTIME-INR
INR: 1.9
Prothrombin Time: 22.3 secs — ABNORMAL HIGH (ref 11.5–14.7)

## 2012-01-26 LAB — WBC: WBC: 10 10*3/uL (ref 3.8–10.6)

## 2012-01-30 LAB — CULTURE, BLOOD (SINGLE)

## 2012-01-31 ENCOUNTER — Ambulatory Visit: Payer: Self-pay | Admitting: Internal Medicine

## 2012-11-07 ENCOUNTER — Ambulatory Visit: Payer: Self-pay | Admitting: Family

## 2013-04-24 ENCOUNTER — Inpatient Hospital Stay: Payer: Self-pay | Admitting: Internal Medicine

## 2013-04-24 DIAGNOSIS — I059 Rheumatic mitral valve disease, unspecified: Secondary | ICD-10-CM

## 2013-04-24 LAB — PROTIME-INR
INR: 2
Prothrombin Time: 21.9 secs — ABNORMAL HIGH (ref 11.5–14.7)

## 2013-04-24 LAB — COMPREHENSIVE METABOLIC PANEL
Albumin: 3.2 g/dL — ABNORMAL LOW (ref 3.4–5.0)
Alkaline Phosphatase: 81 U/L
Anion Gap: 7 (ref 7–16)
BUN: 20 mg/dL — ABNORMAL HIGH (ref 7–18)
Bilirubin,Total: 0.8 mg/dL (ref 0.2–1.0)
CHLORIDE: 99 mmol/L (ref 98–107)
CREATININE: 1.04 mg/dL (ref 0.60–1.30)
Calcium, Total: 9 mg/dL (ref 8.5–10.1)
Co2: 26 mmol/L (ref 21–32)
EGFR (African American): >60
GLUCOSE: 189 mg/dL — AB (ref 65–99)
OSMOLALITY: 272 (ref 275–301)
POTASSIUM: 3.8 mmol/L (ref 3.5–5.1)
SGOT(AST): 17 U/L (ref 15–37)
SGPT (ALT): 21 U/L (ref 12–78)
Sodium: 132 mmol/L — ABNORMAL LOW (ref 136–145)
Total Protein: 7.8 g/dL (ref 6.4–8.2)

## 2013-04-24 LAB — CBC
HCT: 47.8 % (ref 40.0–52.0)
HGB: 15.9 g/dL (ref 13.0–18.0)
MCH: 27.8 pg (ref 26.0–34.0)
MCHC: 33.2 g/dL (ref 32.0–36.0)
MCV: 84 fL (ref 80–100)
Platelet: 257 10*3/uL (ref 150–440)
RBC: 5.71 10*6/uL (ref 4.40–5.90)
RDW: 16.4 % — AB (ref 11.5–14.5)
WBC: 17.1 10*3/uL — ABNORMAL HIGH (ref 3.8–10.6)

## 2013-04-24 LAB — TROPONIN I: Troponin-I: <0.02 ng/mL

## 2013-04-24 LAB — CK TOTAL AND CKMB (NOT AT ARMC)
CK, Total: 49 U/L (ref 35–232)
CK, Total: 41 U/L (ref 35–232)
CK-MB: 1.8 ng/mL (ref 0.5–3.6)
CK-MB: 1.4 ng/mL (ref 0.5–3.6)

## 2013-04-24 LAB — PRO B NATRIURETIC PEPTIDE: B-Type Natriuretic Peptide: 4129 pg/mL — ABNORMAL HIGH (ref 0–450)

## 2013-04-24 LAB — RAPID INFLUENZA A&B ANTIGENS (ARMC ONLY)

## 2013-04-25 LAB — PROTIME-INR
INR: 2
Prothrombin Time: 22.6 secs — ABNORMAL HIGH (ref 11.5–14.7)

## 2013-04-25 LAB — COMPREHENSIVE METABOLIC PANEL
ALK PHOS: 79 U/L
Albumin: 3.1 g/dL — ABNORMAL LOW (ref 3.4–5.0)
Anion Gap: 11 (ref 7–16)
BUN: 31 mg/dL — ABNORMAL HIGH (ref 7–18)
Bilirubin,Total: 0.6 mg/dL (ref 0.2–1.0)
CREATININE: 1.19 mg/dL (ref 0.60–1.30)
Calcium, Total: 9.2 mg/dL (ref 8.5–10.1)
Chloride: 100 mmol/L (ref 98–107)
Co2: 24 mmol/L (ref 21–32)
EGFR (Non-African Amer.): 58 — ABNORMAL LOW
Glucose: 237 mg/dL — ABNORMAL HIGH (ref 65–99)
Osmolality: 284 (ref 275–301)
Potassium: 4 mmol/L (ref 3.5–5.1)
SGOT(AST): 10 U/L — ABNORMAL LOW (ref 15–37)
SGPT (ALT): 18 U/L (ref 12–78)
Sodium: 135 mmol/L — ABNORMAL LOW (ref 136–145)
Total Protein: 7.6 g/dL (ref 6.4–8.2)

## 2013-04-25 LAB — CBC WITH DIFFERENTIAL/PLATELET
BASOS ABS: 0.1 10*3/uL (ref 0.0–0.1)
Basophil %: 0.5 %
Eosinophil #: 0 10*3/uL (ref 0.0–0.7)
Eosinophil %: 0 %
HCT: 46 % (ref 40.0–52.0)
HGB: 15.5 g/dL (ref 13.0–18.0)
LYMPHS ABS: 1.2 10*3/uL (ref 1.0–3.6)
LYMPHS PCT: 9.9 %
MCH: 28.2 pg (ref 26.0–34.0)
MCHC: 33.7 g/dL (ref 32.0–36.0)
MCV: 84 fL (ref 80–100)
MONO ABS: 1.2 x10 3/mm — AB (ref 0.2–1.0)
Monocyte %: 9.5 %
Neutrophil #: 9.8 10*3/uL — ABNORMAL HIGH (ref 1.4–6.5)
Neutrophil %: 80.1 %
PLATELETS: 252 10*3/uL (ref 150–440)
RBC: 5.51 10*6/uL (ref 4.40–5.90)
RDW: 16.6 % — ABNORMAL HIGH (ref 11.5–14.5)
WBC: 12.2 10*3/uL — ABNORMAL HIGH (ref 3.8–10.6)

## 2013-04-25 LAB — CK TOTAL AND CKMB (NOT AT ARMC)
CK, Total: 42 U/L (ref 35–232)
CK, Total: 53 U/L (ref 35–232)
CK-MB: 1.3 ng/mL (ref 0.5–3.6)
CK-MB: 1.5 ng/mL (ref 0.5–3.6)

## 2013-04-25 LAB — TROPONIN I: Troponin-I: <0.02 ng/mL

## 2013-04-25 LAB — DIGOXIN LEVEL: Digoxin: 0.46 ng/mL

## 2013-04-25 LAB — PSA: PSA: 4.1 ng/mL — ABNORMAL HIGH (ref 0.0–4.0)

## 2013-04-26 LAB — PROTIME-INR
INR: 2.3
Prothrombin Time: 24.8 secs — ABNORMAL HIGH (ref 11.5–14.7)

## 2013-04-29 LAB — CULTURE, BLOOD (SINGLE)

## 2013-05-03 ENCOUNTER — Emergency Department: Payer: Self-pay | Admitting: Emergency Medicine

## 2013-05-03 LAB — COMPREHENSIVE METABOLIC PANEL
ALK PHOS: 74 U/L
ALT: 23 U/L (ref 12–78)
ANION GAP: 5 — AB (ref 7–16)
Albumin: 3 g/dL — ABNORMAL LOW (ref 3.4–5.0)
BILIRUBIN TOTAL: 0.6 mg/dL (ref 0.2–1.0)
BUN: 18 mg/dL (ref 7–18)
CHLORIDE: 101 mmol/L (ref 98–107)
Calcium, Total: 9.9 mg/dL (ref 8.5–10.1)
Co2: 27 mmol/L (ref 21–32)
Creatinine: 1.16 mg/dL (ref 0.60–1.30)
EGFR (African American): >60
GFR CALC NON AF AMER: 60 — AB
GLUCOSE: 230 mg/dL — AB (ref 65–99)
OSMOLALITY: 276 (ref 275–301)
Potassium: 4.1 mmol/L (ref 3.5–5.1)
SGOT(AST): 22 U/L (ref 15–37)
SODIUM: 133 mmol/L — AB (ref 136–145)
Total Protein: 7.1 g/dL (ref 6.4–8.2)

## 2013-05-03 LAB — URINALYSIS, COMPLETE
BILIRUBIN, UR: NEGATIVE
Glucose,UR: >=500 mg/dL (ref 0–75)
Ketone: NEGATIVE
Nitrite: NEGATIVE
PH: 5 (ref 4.5–8.0)
PROTEIN: NEGATIVE
Specific Gravity: 1.013 (ref 1.003–1.030)
Squamous Epithelial: 2

## 2013-05-03 LAB — CBC
HCT: 47 % (ref 40.0–52.0)
HGB: 15.6 g/dL (ref 13.0–18.0)
MCH: 28.5 pg (ref 26.0–34.0)
MCHC: 33.2 g/dL (ref 32.0–36.0)
MCV: 86 fL (ref 80–100)
Platelet: 278 10*3/uL (ref 150–440)
RBC: 5.48 10*6/uL (ref 4.40–5.90)
RDW: 16.2 % — AB (ref 11.5–14.5)
WBC: 22.2 10*3/uL — AB (ref 3.8–10.6)

## 2013-05-03 LAB — LIPASE, BLOOD: Lipase: 323 U/L (ref 73–393)

## 2013-05-07 ENCOUNTER — Inpatient Hospital Stay: Payer: Self-pay | Admitting: Internal Medicine

## 2013-05-07 LAB — PROTIME-INR
INR: 2.9
Prothrombin Time: 29.6 secs — ABNORMAL HIGH (ref 11.5–14.7)

## 2013-05-07 LAB — CK TOTAL AND CKMB (NOT AT ARMC)
CK, TOTAL: 27 U/L — AB (ref 35–232)
CK-MB: <0.5 ng/mL — ABNORMAL LOW (ref 0.5–3.6)

## 2013-05-07 LAB — CBC WITH DIFFERENTIAL/PLATELET
BASOS ABS: 0.1 10*3/uL (ref 0.0–0.1)
Basophil %: 0.5 %
Eosinophil #: 0.1 10*3/uL (ref 0.0–0.7)
Eosinophil %: 0.4 %
HCT: 40.7 % (ref 40.0–52.0)
HGB: 13.4 g/dL (ref 13.0–18.0)
LYMPHS ABS: 2.1 10*3/uL (ref 1.0–3.6)
LYMPHS PCT: 9.7 %
MCH: 28.2 pg (ref 26.0–34.0)
MCHC: 33 g/dL (ref 32.0–36.0)
MCV: 86 fL (ref 80–100)
MONOS PCT: 13.8 %
Monocyte #: 2.9 x10 3/mm — ABNORMAL HIGH (ref 0.2–1.0)
Neutrophil #: 16.1 10*3/uL — ABNORMAL HIGH (ref 1.4–6.5)
Neutrophil %: 75.6 %
Platelet: 222 10*3/uL (ref 150–440)
RBC: 4.76 10*6/uL (ref 4.40–5.90)
RDW: 16.4 % — ABNORMAL HIGH (ref 11.5–14.5)
WBC: 21.5 10*3/uL — AB (ref 3.8–10.6)

## 2013-05-07 LAB — COMPREHENSIVE METABOLIC PANEL
ALBUMIN: 2.3 g/dL — AB (ref 3.4–5.0)
ALK PHOS: 62 U/L
ANION GAP: 8 (ref 7–16)
AST: 10 U/L — AB (ref 15–37)
BUN: 23 mg/dL — AB (ref 7–18)
Bilirubin,Total: 0.7 mg/dL (ref 0.2–1.0)
CHLORIDE: 100 mmol/L (ref 98–107)
Calcium, Total: 9.1 mg/dL (ref 8.5–10.1)
Co2: 26 mmol/L (ref 21–32)
Creatinine: 1.59 mg/dL — ABNORMAL HIGH (ref 0.60–1.30)
EGFR (African American): 47 — ABNORMAL LOW
EGFR (Non-African Amer.): 41 — ABNORMAL LOW
Glucose: 146 mg/dL — ABNORMAL HIGH (ref 65–99)
Osmolality: 275 (ref 275–301)
POTASSIUM: 4.1 mmol/L (ref 3.5–5.1)
SGPT (ALT): 13 U/L (ref 12–78)
SODIUM: 134 mmol/L — AB (ref 136–145)
TOTAL PROTEIN: 6.7 g/dL (ref 6.4–8.2)

## 2013-05-07 LAB — TROPONIN I: Troponin-I: <0.02 ng/mL

## 2013-05-07 LAB — URINALYSIS, COMPLETE
BILIRUBIN, UR: NEGATIVE
Glucose,UR: NEGATIVE mg/dL (ref 0–75)
Ketone: NEGATIVE
NITRITE: NEGATIVE
Ph: 5 (ref 4.5–8.0)
RBC,UR: 75 /HPF (ref 0–5)
Specific Gravity: 1.02 (ref 1.003–1.030)
Squamous Epithelial: 3
WBC UR: 475 /HPF (ref 0–5)

## 2013-05-08 LAB — BASIC METABOLIC PANEL
ANION GAP: 7 (ref 7–16)
BUN: 20 mg/dL — ABNORMAL HIGH (ref 7–18)
Calcium, Total: 8.4 mg/dL — ABNORMAL LOW (ref 8.5–10.1)
Chloride: 103 mmol/L (ref 98–107)
Co2: 24 mmol/L (ref 21–32)
Creatinine: 1.38 mg/dL — ABNORMAL HIGH (ref 0.60–1.30)
GFR CALC AF AMER: 56 — AB
GFR CALC NON AF AMER: 48 — AB
Glucose: 148 mg/dL — ABNORMAL HIGH (ref 65–99)
Osmolality: 274 (ref 275–301)
Potassium: 3.9 mmol/L (ref 3.5–5.1)
Sodium: 134 mmol/L — ABNORMAL LOW (ref 136–145)

## 2013-05-08 LAB — URINE CULTURE

## 2013-05-08 LAB — CBC WITH DIFFERENTIAL/PLATELET
BASOS PCT: 0.5 %
Basophil #: 0.1 10*3/uL (ref 0.0–0.1)
Eosinophil #: 0.1 10*3/uL (ref 0.0–0.7)
Eosinophil %: 0.7 %
HCT: 37.1 % — AB (ref 40.0–52.0)
HGB: 12.4 g/dL — ABNORMAL LOW (ref 13.0–18.0)
LYMPHS ABS: 1.2 10*3/uL (ref 1.0–3.6)
LYMPHS PCT: 6.4 %
MCH: 28.5 pg (ref 26.0–34.0)
MCHC: 33.3 g/dL (ref 32.0–36.0)
MCV: 86 fL (ref 80–100)
MONOS PCT: 11.3 %
Monocyte #: 2.1 x10 3/mm — ABNORMAL HIGH (ref 0.2–1.0)
Neutrophil #: 14.7 10*3/uL — ABNORMAL HIGH (ref 1.4–6.5)
Neutrophil %: 81.1 %
Platelet: 182 10*3/uL (ref 150–440)
RBC: 4.34 10*6/uL — AB (ref 4.40–5.90)
RDW: 16 % — ABNORMAL HIGH (ref 11.5–14.5)
WBC: 18.1 10*3/uL — ABNORMAL HIGH (ref 3.8–10.6)

## 2013-05-08 LAB — PROTIME-INR
INR: 2
PROTHROMBIN TIME: 22.1 s — AB (ref 11.5–14.7)

## 2013-05-10 LAB — URINE CULTURE

## 2013-05-12 LAB — CULTURE, BLOOD (SINGLE)

## 2013-05-13 HISTORY — PX: KIDNEY SURGERY: SHX687

## 2013-05-14 ENCOUNTER — Emergency Department: Payer: Self-pay | Admitting: Emergency Medicine

## 2013-05-14 LAB — URINALYSIS, COMPLETE
Bilirubin,UR: NEGATIVE
Glucose,UR: NEGATIVE mg/dL (ref 0–75)
KETONE: NEGATIVE
Nitrite: POSITIVE
Ph: 5 (ref 4.5–8.0)
Protein: NEGATIVE
RBC,UR: 17 /HPF (ref 0–5)
SPECIFIC GRAVITY: 1.01 (ref 1.003–1.030)

## 2013-05-14 LAB — CBC WITH DIFFERENTIAL/PLATELET
BASOS PCT: 0.6 %
Basophil #: 0.1 10*3/uL (ref 0.0–0.1)
EOS PCT: 1.4 %
Eosinophil #: 0.2 10*3/uL (ref 0.0–0.7)
HCT: 39.3 % — AB (ref 40.0–52.0)
HGB: 12.9 g/dL — ABNORMAL LOW (ref 13.0–18.0)
Lymphocyte #: 1 10*3/uL (ref 1.0–3.6)
Lymphocyte %: 7.9 %
MCH: 27.7 pg (ref 26.0–34.0)
MCHC: 32.7 g/dL (ref 32.0–36.0)
MCV: 85 fL (ref 80–100)
MONOS PCT: 10.5 %
Monocyte #: 1.3 x10 3/mm — ABNORMAL HIGH (ref 0.2–1.0)
Neutrophil #: 9.8 10*3/uL — ABNORMAL HIGH (ref 1.4–6.5)
Neutrophil %: 79.6 %
PLATELETS: 295 10*3/uL (ref 150–440)
RBC: 4.65 10*6/uL (ref 4.40–5.90)
RDW: 16.4 % — ABNORMAL HIGH (ref 11.5–14.5)
WBC: 12.3 10*3/uL — ABNORMAL HIGH (ref 3.8–10.6)

## 2013-05-14 LAB — COMPREHENSIVE METABOLIC PANEL
ALT: 19 U/L (ref 12–78)
ANION GAP: 4 — AB (ref 7–16)
AST: 16 U/L (ref 15–37)
Albumin: 1.8 g/dL — ABNORMAL LOW (ref 3.4–5.0)
Alkaline Phosphatase: 103 U/L
BUN: 20 mg/dL — ABNORMAL HIGH (ref 7–18)
Bilirubin,Total: 0.5 mg/dL (ref 0.2–1.0)
CHLORIDE: 103 mmol/L (ref 98–107)
CO2: 27 mmol/L (ref 21–32)
Calcium, Total: 9 mg/dL (ref 8.5–10.1)
Creatinine: 1.34 mg/dL — ABNORMAL HIGH (ref 0.60–1.30)
EGFR (African American): 58 — ABNORMAL LOW
EGFR (Non-African Amer.): 50 — ABNORMAL LOW
GLUCOSE: 196 mg/dL — AB (ref 65–99)
OSMOLALITY: 276 (ref 275–301)
POTASSIUM: 3.5 mmol/L (ref 3.5–5.1)
Sodium: 134 mmol/L — ABNORMAL LOW (ref 136–145)
TOTAL PROTEIN: 6.9 g/dL (ref 6.4–8.2)

## 2013-05-14 LAB — PROTIME-INR
INR: 1.4
PROTHROMBIN TIME: 17.3 s — AB (ref 11.5–14.7)

## 2013-05-17 LAB — URINE CULTURE

## 2013-06-07 ENCOUNTER — Ambulatory Visit: Payer: Medicare Other | Admitting: Internal Medicine

## 2013-06-08 ENCOUNTER — Other Ambulatory Visit (INDEPENDENT_AMBULATORY_CARE_PROVIDER_SITE_OTHER): Payer: Medicare Other

## 2013-06-08 DIAGNOSIS — Z79899 Other long term (current) drug therapy: Secondary | ICD-10-CM

## 2013-06-08 DIAGNOSIS — I4891 Unspecified atrial fibrillation: Secondary | ICD-10-CM

## 2013-06-08 LAB — PROTIME-INR
INR: 4.4 ratio — ABNORMAL HIGH (ref 0.8–1.0)
Prothrombin Time: 44.8 s — ABNORMAL HIGH (ref 10.2–12.4)

## 2013-06-11 ENCOUNTER — Ambulatory Visit (INDEPENDENT_AMBULATORY_CARE_PROVIDER_SITE_OTHER): Payer: Medicare Other | Admitting: Family Medicine

## 2013-06-11 ENCOUNTER — Encounter: Payer: Self-pay | Admitting: Internal Medicine

## 2013-06-11 ENCOUNTER — Ambulatory Visit (INDEPENDENT_AMBULATORY_CARE_PROVIDER_SITE_OTHER): Payer: Medicare Other | Admitting: Internal Medicine

## 2013-06-11 VITALS — BP 138/82 | HR 86 | Temp 98.0°F | Ht 66.0 in | Wt 180.0 lb

## 2013-06-11 DIAGNOSIS — I6350 Cerebral infarction due to unspecified occlusion or stenosis of unspecified cerebral artery: Secondary | ICD-10-CM

## 2013-06-11 DIAGNOSIS — R300 Dysuria: Secondary | ICD-10-CM

## 2013-06-11 DIAGNOSIS — Z5181 Encounter for therapeutic drug level monitoring: Secondary | ICD-10-CM

## 2013-06-11 DIAGNOSIS — N390 Urinary tract infection, site not specified: Secondary | ICD-10-CM

## 2013-06-11 DIAGNOSIS — I4891 Unspecified atrial fibrillation: Secondary | ICD-10-CM

## 2013-06-11 LAB — POCT URINALYSIS DIPSTICK
BILIRUBIN UA: NEGATIVE
GLUCOSE UA: NEGATIVE
Ketones, UA: NEGATIVE
Nitrite, UA: NEGATIVE
SPEC GRAV UA: 1.015
Urobilinogen, UA: 0.2
pH, UA: 6

## 2013-06-11 LAB — POCT INR: INR: 3.1

## 2013-06-11 MED ORDER — CIPROFLOXACIN HCL 500 MG PO TABS: 500.0000 mg | ORAL_TABLET | Freq: Two times a day (BID) | ORAL | Status: DC

## 2013-06-11 NOTE — Assessment & Plan Note (Signed)
Will have him establish with the coumadin clinic today.

## 2013-06-11 NOTE — Progress Notes (Signed)
HPI  Pt presents to the clinic today to establish for anti-coag visits. He is coumadin for a stroke and afib. He did have his INR checked this past Friday. It was 4.4. He can not remember his exact goal. He denies any confusion, headache or abnormal bleeding. He has been on about 4 different antibiotics secondary to recurrent UTI's. He still c/o of dysuria for the past week. He denies back pain, nausea, or fever. He would like his urine checked to day to see if the infection is cleared up.  He  Past Medical History  Diagnosis Date  . MI   . MITRAL REGURGITATION   . HYPERTENSION   . HYPERLIPIDEMIA   . CVA   . CAD   . Atrial fibrillation   . Edema   . DM   . Stroke 1999  . History of kidney stones   . Neuropathy of both feet   . CHF (congestive heart failure)     Current Outpatient Prescriptions  Medication Sig Dispense Refill  . acetaminophen (TYLENOL) 650 MG CR tablet Take 650 mg by mouth every 8 (eight) hours as needed.        Marland Kitchen albuterol (PROVENTIL HFA;VENTOLIN HFA) 108 (90 BASE) MCG/ACT inhaler Inhale 2 puffs into the lungs every 6 (six) hours as needed.        Marland Kitchen aspirin 81 MG tablet Take 81 mg by mouth daily.        Marland Kitchen atorvastatin (LIPITOR) 40 MG tablet Take 40 mg by mouth daily.        . bimatoprost (LUMIGAN) 0.03 % ophthalmic solution 1 drop at bedtime.        . citalopram (CELEXA) 10 MG tablet Take 10 mg by mouth daily.        . furosemide (LASIX) 20 MG tablet Take 20 mg by mouth daily.        Marland Kitchen glimepiride (AMARYL) 4 MG tablet Take 4 mg by mouth daily with breakfast.      . isosorbide dinitrate (ISORDIL) 40 MG tablet Take 40 mg by mouth 3 (three) times daily.        Marland Kitchen lisinopril (PRINIVIL,ZESTRIL) 40 MG tablet Take 40 mg by mouth daily.        . metFORMIN (GLUCOPHAGE) 1000 MG tablet Take 1,000 mg by mouth 2 (two) times daily with a meal.        . metoprolol (TOPROL-XL) 50 MG 24 hr tablet Take 50 mg by mouth. 2 am and 1 pm       . mometasone (NASONEX) 50 MCG/ACT nasal spray  Place 2 sprays into the nose daily.        . nitroGLYCERIN (NITROSTAT) 0.4 MG SL tablet Place 0.4 mg under the tongue every 5 (five) minutes as needed.        . potassium chloride (KLOR-CON) 10 MEQ CR tablet Take 10 mEq by mouth daily. 2 tablets one day 3 tablets the next day      . tiotropium (SPIRIVA) 18 MCG inhalation capsule Place 18 mcg into inhaler and inhale daily.        Marland Kitchen warfarin (COUMADIN) 2 MG tablet Take 4 mg by mouth as directed. 2 tablets       No current facility-administered medications for this visit.    Allergies  Allergen Reactions  . Morphine And Related Other (See Comments)    Hallucinations   . Niacin And Related Dermatitis    Family History  Problem Relation Age of Onset  . Heart disease  Maternal Grandmother   . Diabetes Maternal Grandmother     History   Social History  . Marital Status: Married    Spouse Name: N/A    Number of Children: N/A  . Years of Education: N/A   Occupational History  . Not on file.   Social History Main Topics  . Smoking status: Former Research scientist (life sciences)  . Smokeless tobacco: Never Used  . Alcohol Use: No  . Drug Use: No  . Sexual Activity: Not on file   Other Topics Concern  . Not on file   Social History Narrative  . No narrative on file    ROS:  Constitutional: Denies fever, malaise, fatigue, headache or abrupt weight changes.  Respiratory: Denies difficulty breathing, shortness of breath, cough or sputum production.   Cardiovascular: Denies chest pain, chest tightness, palpitations or swelling in the hands or feet.  GU: Pt reports dysuria. Denies frequency, urgency, blood in urine, odor or discharge. Neurological: Denies dizziness, difficulty with memory, difficulty with speech or problems with balance and coordination.   No other specific complaints in a complete review of systems (except as listed in HPI above).  PE:  BP 138/82  Pulse 86  Temp(Src) 98 F (36.7 C) (Oral)  Ht 5\' 6"  (1.676 m)  Wt 180 lb (81.647  kg)  BMI 29.07 kg/m2  SpO2 97% Wt Readings from Last 3 Encounters:  06/11/13 180 lb (81.647 kg)  03/02/11 194 lb (87.998 kg)  02/24/10 186 lb (84.369 kg)    General: Appears his stated age, chronically ill appearing in NAD. Cardiovascjscript:void(0)ular: Normal rate and rhythm. S1,S2 noted.  No murmur, rubs or gallops noted. No JVD or BLE edema. No carotid bruits noted. Pulmonary/Chest: Normal effort and positive vesicular breath sounds. No respiratory distress. No wheezes, rales or ronchi noted.  Abdomen: Soft and nontender. Normal bowel sounds, no bruits noted. No distention or masses noted. Liver, spleen and kidneys non palpable. No CVA tenderness   Assessment and Plan:  Dysuria, secondary to UTI:  Will obtain urinalysis and urine culture. Mod leuks and mod blood eRx for Cipro BID x 10 days May need to go back to see urology   RTC in 2 weeks to "establish care" and recheck coumadin

## 2013-06-11 NOTE — Patient Instructions (Addendum)

## 2013-06-11 NOTE — Assessment & Plan Note (Signed)
Will have him establish with the coumadin clinic today

## 2013-06-11 NOTE — Addendum Note (Signed)
Addended by: Lurlean Nanny on: 06/11/2013 04:53 PM   Modules accepted: Orders

## 2013-06-14 ENCOUNTER — Other Ambulatory Visit: Payer: Self-pay | Admitting: Internal Medicine

## 2013-06-14 LAB — URINE CULTURE

## 2013-06-14 MED ORDER — LEVOFLOXACIN 500 MG PO TABS: 500.0000 mg | ORAL_TABLET | Freq: Every day | ORAL | Status: DC

## 2013-06-21 ENCOUNTER — Ambulatory Visit: Payer: Medicare Other | Admitting: Internal Medicine

## 2013-06-22 ENCOUNTER — Ambulatory Visit (INDEPENDENT_AMBULATORY_CARE_PROVIDER_SITE_OTHER): Payer: Medicare Other | Admitting: Family Medicine

## 2013-06-22 ENCOUNTER — Encounter: Payer: Self-pay | Admitting: Internal Medicine

## 2013-06-22 ENCOUNTER — Ambulatory Visit (INDEPENDENT_AMBULATORY_CARE_PROVIDER_SITE_OTHER): Payer: Medicare Other | Admitting: Internal Medicine

## 2013-06-22 VITALS — BP 118/76 | HR 66 | Temp 97.8°F | Wt 184.5 lb

## 2013-06-22 DIAGNOSIS — I219 Acute myocardial infarction, unspecified: Secondary | ICD-10-CM

## 2013-06-22 DIAGNOSIS — I509 Heart failure, unspecified: Secondary | ICD-10-CM

## 2013-06-22 DIAGNOSIS — I6350 Cerebral infarction due to unspecified occlusion or stenosis of unspecified cerebral artery: Secondary | ICD-10-CM

## 2013-06-22 DIAGNOSIS — R350 Frequency of micturition: Secondary | ICD-10-CM

## 2013-06-22 DIAGNOSIS — I4891 Unspecified atrial fibrillation: Secondary | ICD-10-CM

## 2013-06-22 DIAGNOSIS — Z23 Encounter for immunization: Secondary | ICD-10-CM

## 2013-06-22 DIAGNOSIS — E119 Type 2 diabetes mellitus without complications: Secondary | ICD-10-CM

## 2013-06-22 DIAGNOSIS — R609 Edema, unspecified: Secondary | ICD-10-CM

## 2013-06-22 DIAGNOSIS — Z5181 Encounter for therapeutic drug level monitoring: Secondary | ICD-10-CM

## 2013-06-22 DIAGNOSIS — I10 Essential (primary) hypertension: Secondary | ICD-10-CM

## 2013-06-22 DIAGNOSIS — I2510 Atherosclerotic heart disease of native coronary artery without angina pectoris: Secondary | ICD-10-CM

## 2013-06-22 DIAGNOSIS — E785 Hyperlipidemia, unspecified: Secondary | ICD-10-CM

## 2013-06-22 LAB — CBC
HCT: 38.1 % — ABNORMAL LOW (ref 39.0–52.0)
HEMOGLOBIN: 12.5 g/dL — AB (ref 13.0–17.0)
MCHC: 32.9 g/dL (ref 30.0–36.0)
MCV: 84.6 fl (ref 78.0–100.0)
PLATELETS: 334 10*3/uL (ref 150.0–400.0)
RBC: 4.5 Mil/uL (ref 4.22–5.81)
RDW: 17.6 % — ABNORMAL HIGH (ref 11.5–14.6)
WBC: 12.2 10*3/uL — ABNORMAL HIGH (ref 4.5–10.5)

## 2013-06-22 LAB — POCT URINALYSIS DIPSTICK
BILIRUBIN UA: NEGATIVE
GLUCOSE UA: NEGATIVE
Ketones, UA: NEGATIVE
NITRITE UA: NEGATIVE
Spec Grav, UA: 1.02
Urobilinogen, UA: 0.2
pH, UA: 6

## 2013-06-22 LAB — COMPREHENSIVE METABOLIC PANEL
ALT: 13 U/L (ref 0–53)
AST: 12 U/L (ref 0–37)
Albumin: 3.1 g/dL — ABNORMAL LOW (ref 3.5–5.2)
Alkaline Phosphatase: 63 U/L (ref 39–117)
BUN: 15 mg/dL (ref 6–23)
CALCIUM: 9.2 mg/dL (ref 8.4–10.5)
CHLORIDE: 104 meq/L (ref 96–112)
CO2: 27 mEq/L (ref 19–32)
Creatinine, Ser: 1.1 mg/dL (ref 0.4–1.5)
GFR: 65.77 mL/min (ref >60.00)
GLUCOSE: 150 mg/dL — AB (ref 70–99)
Potassium: 4.3 mEq/L (ref 3.5–5.1)
Sodium: 137 mEq/L (ref 135–145)
Total Bilirubin: 0.7 mg/dL (ref 0.3–1.2)
Total Protein: 7.2 g/dL (ref 6.0–8.3)

## 2013-06-22 LAB — HEMOGLOBIN A1C: Hgb A1c MFr Bld: 7.9 % — ABNORMAL HIGH (ref 4.6–6.5)

## 2013-06-22 LAB — LIPID PANEL
CHOLESTEROL: 109 mg/dL (ref 0–200)
HDL: 29.8 mg/dL — ABNORMAL LOW (ref >39.00)
LDL Cholesterol: 41 mg/dL (ref 0–99)
TRIGLYCERIDES: 192 mg/dL — AB (ref 0.0–149.0)
Total CHOL/HDL Ratio: 4
VLDL: 38.4 mg/dL (ref 0.0–40.0)

## 2013-06-22 LAB — POCT INR: INR: 3.4

## 2013-06-22 NOTE — Assessment & Plan Note (Signed)
On lipitor Will recheck lipid panel today

## 2013-06-22 NOTE — Assessment & Plan Note (Signed)
On metoprolol  Will refer to cardiology

## 2013-06-22 NOTE — Addendum Note (Signed)
Addended by: Lurlean Nanny on: 06/22/2013 02:47 PM   Modules accepted: Orders

## 2013-06-22 NOTE — Patient Instructions (Addendum)
Type 2 Diabetes Mellitus, Adult Type 2 diabetes mellitus, often simply referred to as type 2 diabetes, is a long-lasting (chronic) disease. In type 2 diabetes, the pancreas does not make enough insulin (a hormone), the cells are less responsive to the insulin that is made (insulin resistance), or both. Normally, insulin moves sugars from food into the tissue cells. The tissue cells use the sugars for energy. The lack of insulin or the lack of normal response to insulin causes excess sugars to build up in the blood instead of going into the tissue cells. As a result, high blood sugar (hyperglycemia) develops. The effect of high sugar (glucose) levels can cause many complications. Type 2 diabetes was also previously called adult-onset diabetes but it can occur at any age.  RISK FACTORS  A person is predisposed to developing type 2 diabetes if someone in the family has the disease and also has one or more of the following primary risk factors:  Overweight.  An inactive lifestyle.  A history of consistently eating high-calorie foods. Maintaining a normal weight and regular physical activity can reduce the chance of developing type 2 diabetes. SYMPTOMS  A person with type 2 diabetes may not show symptoms initially. The symptoms of type 2 diabetes appear slowly. The symptoms include:  Increased thirst (polydipsia).  Increased urination (polyuria).  Increased urination during the night (nocturia).  Weight loss. This weight loss may be rapid.  Frequent, recurring infections.  Tiredness (fatigue).  Weakness.  Vision changes, such as blurred vision.  Fruity smell to your breath.  Abdominal pain.  Nausea or vomiting.  Cuts or bruises which are slow to heal.  Tingling or numbness in the hands or feet. DIAGNOSIS Type 2 diabetes is frequently not diagnosed until complications of diabetes are present. Type 2 diabetes is diagnosed when symptoms or complications are present and when blood  glucose levels are increased. Your blood glucose level may be checked by one or more of the following blood tests:  A fasting blood glucose test. You will not be allowed to eat for at least 8 hours before a blood sample is taken.  A random blood glucose test. Your blood glucose is checked at any time of the day regardless of when you ate.  A hemoglobin A1c blood glucose test. A hemoglobin A1c test provides information about blood glucose control over the previous 3 months.  An oral glucose tolerance test (OGTT). Your blood glucose is measured after you have not eaten (fasted) for 2 hours and then after you drink a glucose-containing beverage. TREATMENT   You may need to take insulin or diabetes medicine daily to keep blood glucose levels in the desired range.  You will need to match insulin dosing with exercise and healthy food choices. The treatment goal is to maintain the before meal blood sugar (preprandial glucose) level at 70 130 mg/dL. HOME CARE INSTRUCTIONS   Have your hemoglobin A1c level checked twice a year.  Perform daily blood glucose monitoring as directed by your caregiver.  Monitor urine ketones when you are ill and as directed by your caregiver.  Take your diabetes medicine or insulin as directed by your caregiver to maintain your blood glucose levels in the desired range.  Never run out of diabetes medicine or insulin. It is needed every day.  Adjust insulin based on your intake of carbohydrates. Carbohydrates can raise blood glucose levels but need to be included in your diet. Carbohydrates provide vitamins, minerals, and fiber which are an essential part of   a healthy diet. Carbohydrates are found in fruits, vegetables, whole grains, dairy products, legumes, and foods containing added sugars.    Eat healthy foods. Alternate 3 meals with 3 snacks.  Lose weight if overweight.  Carry a medical alert card or wear your medical alert jewelry.  Carry a 15 gram  carbohydrate snack with you at all times to treat low blood glucose (hypoglycemia). Some examples of 15 gram carbohydrate snacks include:  Glucose tablets, 3 or 4   Glucose gel, 15 gram tube  Raisins, 2 tablespoons (24 grams)  Jelly beans, 6  Animal crackers, 8  Regular pop, 4 ounces (120 mL)  Gummy treats, 9  Recognize hypoglycemia. Hypoglycemia occurs with blood glucose levels of 70 mg/dL and below. The risk for hypoglycemia increases when fasting or skipping meals, during or after intense exercise, and during sleep. Hypoglycemia symptoms can include:  Tremors or shakes.  Decreased ability to concentrate.  Sweating.  Increased heart rate.  Headache.  Dry mouth.  Hunger.  Irritability.  Anxiety.  Restless sleep.  Altered speech or coordination.  Confusion.  Treat hypoglycemia promptly. If you are alert and able to safely swallow, follow the 15:15 rule:  Take 15 20 grams of rapid-acting glucose or carbohydrate. Rapid-acting options include glucose gel, glucose tablets, or 4 ounces (120 mL) of fruit juice, regular soda, or low fat milk.  Check your blood glucose level 15 minutes after taking the glucose.  Take 15 20 grams more of glucose if the repeat blood glucose level is still 70 mg/dL or below.  Eat a meal or snack within 1 hour once blood glucose levels return to normal.    Be alert to polyuria and polydipsia which are early signs of hyperglycemia. An early awareness of hyperglycemia allows for prompt treatment. Treat hyperglycemia as directed by your caregiver.  Engage in at least 150 minutes of moderate-intensity physical activity a week, spread over at least 3 days of the week or as directed by your caregiver. In addition, you should engage in resistance exercise at least 2 times a week or as directed by your caregiver.  Adjust your medicine and food intake as needed if you start a new exercise or sport.  Follow your sick day plan at any time you  are unable to eat or drink as usual.  Avoid tobacco use.  Limit alcohol intake to no more than 1 drink per day for nonpregnant women and 2 drinks per day for men. You should drink alcohol only when you are also eating food. Talk with your caregiver whether alcohol is safe for you. Tell your caregiver if you drink alcohol several times a week.  Follow up with your caregiver regularly.  Schedule an eye exam soon after the diagnosis of type 2 diabetes and then annually.  Perform daily skin and foot care. Examine your skin and feet daily for cuts, bruises, redness, nail problems, bleeding, blisters, or sores. A foot exam by a caregiver should be done annually.  Brush your teeth and gums at least twice a day and floss at least once a day. Follow up with your dentist regularly.  Share your diabetes management plan with your workplace or school.  Stay up-to-date with immunizations.  Learn to manage stress.  Obtain ongoing diabetes education and support as needed.  Participate in, or seek rehabilitation as needed to maintain or improve independence and quality of life. Request a physical or occupational therapy referral if you are having foot or hand numbness or difficulties with grooming,   dressing, eating, or physical activity. SEEK MEDICAL CARE IF:   You are unable to eat food or drink fluids for more than 6 hours.  You have nausea and vomiting for more than 6 hours.  Your blood glucose level is over 240 mg/dL.  There is a change in mental status.  You develop an additional serious illness.  You have diarrhea for more than 6 hours.  You have been sick or have had a fever for a couple of days and are not getting better.  You have pain during any physical activity.  SEEK IMMEDIATE MEDICAL CARE IF:  You have difficulty breathing.  You have moderate to large ketone levels. MAKE SURE YOU:  Understand these instructions.  Will watch your condition.  Will get help right away if  you are not doing well or get worse. Document Released: 03/29/2005 Document Revised: 12/22/2011 Document Reviewed: 10/26/2011 [employer] Patient Information 2014 [employer].  

## 2013-06-22 NOTE — Assessment & Plan Note (Signed)
Sugars have been ranging from 60-300 He stopped his Januvia secondary to hypoglycemic events  Will check A1C today

## 2013-06-22 NOTE — Assessment & Plan Note (Signed)
On ASA/coumadin Will check INR at coumadin clinic  Will refer to cardiology

## 2013-06-22 NOTE — Progress Notes (Signed)
Pre visit review using our clinic review tool, if applicable. No additional management support is needed unless otherwise documented below in the visit note. 

## 2013-06-22 NOTE — Assessment & Plan Note (Signed)
Well controlled on current therapy Will check CBC and CMET today

## 2013-06-22 NOTE — Assessment & Plan Note (Signed)
Secondary to CHF No echo to review in the system He is on lasix Will check kidney function today  Will refer to cardiology

## 2013-06-22 NOTE — Assessment & Plan Note (Signed)
With left sided hemiparesis On asa, statin, coumadin INR being followed Will recheck lipid profile today

## 2013-06-22 NOTE — Assessment & Plan Note (Signed)
On ASA and Statin Will check lipid profile today  Will refer to cardiology

## 2013-06-22 NOTE — Progress Notes (Signed)
HPI Pt presents to the clinic today to establish care. He is transferring care from Dr. Amedeo Gory medical office. He does have a complicated medical history. He does report that he does not follow with a cardiologist. He would like to establish with Dr. Philmore Pali but will need a referral. He has no concerns today.   Flu: 12/11/2013 Pneumovax: 2010 Tetanus: 05/14/2013 Zostovax: unsure if has had chicken pox Colon Screening: never Eye Doctor: yearly Dentist: no- no teeth  Past Medical History  Diagnosis Date  . MI   . MITRAL REGURGITATION   . HYPERTENSION   . HYPERLIPIDEMIA   . CVA   . CAD   . Atrial fibrillation   . Edema   . DM   . Stroke 1999  . History of kidney stones   . Neuropathy of both feet   . CHF (congestive heart failure)     Current Outpatient Prescriptions  Medication Sig Dispense Refill  . acetaminophen (TYLENOL) 650 MG CR tablet Take 650 mg by mouth every 8 (eight) hours as needed.        Marland Kitchen albuterol (PROVENTIL HFA;VENTOLIN HFA) 108 (90 BASE) MCG/ACT inhaler Inhale 2 puffs into the lungs every 6 (six) hours as needed.        Marland Kitchen aspirin 81 MG tablet Take 81 mg by mouth daily.        Marland Kitchen atorvastatin (LIPITOR) 40 MG tablet Take 40 mg by mouth daily.        . bimatoprost (LUMIGAN) 0.03 % ophthalmic solution 1 drop at bedtime.        . budesonide-formoterol (SYMBICORT) 160-4.5 MCG/ACT inhaler Inhale 2 puffs into the lungs 2 (two) times daily.      . citalopram (CELEXA) 10 MG tablet Take 10 mg by mouth daily.        . furosemide (LASIX) 20 MG tablet Take 20 mg by mouth daily.        Marland Kitchen glimepiride (AMARYL) 4 MG tablet Take 4 mg by mouth daily with breakfast.      . isosorbide dinitrate (ISORDIL) 40 MG tablet Take 40 mg by mouth 3 (three) times daily.        Marland Kitchen lisinopril (PRINIVIL,ZESTRIL) 40 MG tablet Take 40 mg by mouth daily.        . metFORMIN (GLUCOPHAGE) 1000 MG tablet Take 1,000 mg by mouth 2 (two) times daily with a meal.        . metoprolol (TOPROL-XL) 50 MG 24 hr tablet  Take 50 mg by mouth. 2 am and 1 pm       . mometasone (NASONEX) 50 MCG/ACT nasal spray Place 2 sprays into the nose daily.        . nitroGLYCERIN (NITROSTAT) 0.4 MG SL tablet Place 0.4 mg under the tongue every 5 (five) minutes as needed.        . potassium chloride (KLOR-CON) 10 MEQ CR tablet Take 10 mEq by mouth daily. 2 tablets one day 3 tablets the next day      . tiotropium (SPIRIVA) 18 MCG inhalation capsule Place 18 mcg into inhaler and inhale daily.        Marland Kitchen warfarin (COUMADIN) 2 MG tablet Take 4 mg by mouth as directed. 2 tablets       No current facility-administered medications for this visit.    Allergies  Allergen Reactions  . Morphine And Related Other (See Comments)    Hallucinations   . Niacin And Related Dermatitis    Family History  Problem Relation  Age of Onset  . Heart disease Maternal Grandmother   . Diabetes Maternal Grandmother   . Cancer Neg Hx   . Stroke Neg Hx     History   Social History  . Marital Status: Married    Spouse Name: N/A    Number of Children: N/A  . Years of Education: N/A   Occupational History  . Not on file.   Social History Main Topics  . Smoking status: Former Research scientist (life sciences)  . Smokeless tobacco: Never Used  . Alcohol Use: No  . Drug Use: No  . Sexual Activity: Not Currently   Other Topics Concern  . Not on file   Social History Narrative  . No narrative on file    ROS:  Constitutional: Pt reports fatigue. Denies fever, malaise,  headache or abrupt weight changes.  Respiratory: Denies difficulty breathing, shortness of breath, cough or sputum production.   Cardiovascular: Denies chest pain, chest tightness, palpitations or swelling in the hands or feet.  Musculoskeletal: Pt reports left sided weakness and difficulty with gait.  Neurological: Denies dizziness, difficulty with memory, difficulty with speech or problems with balance and coordination.   No other specific complaints in a complete review of systems (except as  listed in HPI above).  PE:  BP 118/76  Pulse 66  Temp(Src) 97.8 F (36.6 C) (Oral)  Wt 184 lb 8 oz (83.689 kg)  SpO2 98% Wt Readings from Last 3 Encounters:  06/22/13 184 lb 8 oz (83.689 kg)  06/11/13 180 lb (81.647 kg)  03/02/11 194 lb (87.998 kg)    General: Appears his stated age, obese, chronically ill appearing in NAD. Cardiovascular: Normal rate and rhythm. S1,S2 noted.  No murmur, rubs or gallops noted. No JVD.  1 + pitting edemea BLE. No carotid bruits noted. Pulmonary/Chest: Normal effort and positive vesicular breath sounds. No respiratory distress. No wheezes, rales or ronchi noted.  Musculoskeletal: Ataxic gait, using cane for assistance   Assessment and Plan:  Health Maintenance:  Prevnar given today Will hold off on zostovax, as he is not sure if he has had chicken pox  Urine frequency:  Just finished course of Levaquin- he is asymptomatic now Urinalysis: mod leuks, large blood Will send for urine culture Hold abx for now Will discuss with his urologist

## 2013-06-24 LAB — URINE CULTURE: Colony Count: 30000

## 2013-06-25 ENCOUNTER — Telehealth: Payer: Self-pay | Admitting: Internal Medicine

## 2013-06-25 NOTE — Telephone Encounter (Signed)
Relevant patient education mailed to patient.  

## 2013-06-28 ENCOUNTER — Telehealth: Payer: Self-pay

## 2013-06-28 NOTE — Telephone Encounter (Signed)
Pt called in stating that he is having problems sleeping and wanted to know if you could call something in for him--please advise

## 2013-06-29 NOTE — Telephone Encounter (Signed)
Pt left v/m checking on status of med for insomnia.pt request cb.

## 2013-06-29 NOTE — Telephone Encounter (Signed)
Spoke to pt's wife [name] per HIPAA and she is aware--she states pt is taking a nap and will let him know when he awakes

## 2013-06-29 NOTE — Telephone Encounter (Signed)
If this a new problem, he will need an OV to discuss

## 2013-07-02 ENCOUNTER — Ambulatory Visit: Payer: Self-pay | Admitting: Urology

## 2013-07-20 ENCOUNTER — Ambulatory Visit: Payer: Self-pay | Admitting: Urology

## 2013-07-23 ENCOUNTER — Ambulatory Visit: Payer: Medicare Other

## 2013-07-23 ENCOUNTER — Encounter: Payer: Self-pay | Admitting: Cardiovascular Disease

## 2013-07-23 ENCOUNTER — Ambulatory Visit (INDEPENDENT_AMBULATORY_CARE_PROVIDER_SITE_OTHER): Payer: Medicare Other | Admitting: Family Medicine

## 2013-07-23 ENCOUNTER — Ambulatory Visit (INDEPENDENT_AMBULATORY_CARE_PROVIDER_SITE_OTHER): Payer: Medicare Other | Admitting: Cardiovascular Disease

## 2013-07-23 VITALS — BP 132/82 | HR 85 | Ht 66.0 in | Wt 184.5 lb

## 2013-07-23 DIAGNOSIS — I10 Essential (primary) hypertension: Secondary | ICD-10-CM

## 2013-07-23 DIAGNOSIS — R609 Edema, unspecified: Secondary | ICD-10-CM

## 2013-07-23 DIAGNOSIS — I2510 Atherosclerotic heart disease of native coronary artery without angina pectoris: Secondary | ICD-10-CM

## 2013-07-23 DIAGNOSIS — Z5181 Encounter for therapeutic drug level monitoring: Secondary | ICD-10-CM

## 2013-07-23 DIAGNOSIS — E119 Type 2 diabetes mellitus without complications: Secondary | ICD-10-CM

## 2013-07-23 DIAGNOSIS — E785 Hyperlipidemia, unspecified: Secondary | ICD-10-CM

## 2013-07-23 DIAGNOSIS — I4891 Unspecified atrial fibrillation: Secondary | ICD-10-CM

## 2013-07-23 LAB — POCT INR: INR: 2.9

## 2013-07-23 NOTE — Assessment & Plan Note (Signed)
Cholesterol is at goal on the current lipid regimen. No changes to the medications were made.  

## 2013-07-23 NOTE — Assessment & Plan Note (Signed)
Heart rate well controlled. Continue metoprolol and warfarin

## 2013-07-23 NOTE — Assessment & Plan Note (Signed)
He does not want additional Lasix despite 1+ pitting edema. Recommended he take extra Lasix for any worsening edema or weight gain. Some of his leg edema could be secondary to venous insufficiency.

## 2013-07-23 NOTE — Progress Notes (Signed)
Patient ID: [name], male    DOB: February 21, 1932, 80 y.o.   MRN: [id number]  HPI Comments: [name] is a pleasant gentleman, who has a history of diabetes type 2, coronary artery disease, status post myocardial infarction treated with TPA at [facility] in January 29, 1996.   He has chronic atrial fibrillation since 2006, on warfarin. Walks with a cane   Myoview in March 2007 showed an ejection fraction of 50% with an old scar at the apex and mild peri-infarct ischemia.   echocardiogram in January 2008 showed an ejection fraction at the lower limits of normal with moderate LVH, mild aortic root dilatation, and mild mitral regurgitation.  There was biatrial enlargement.  Recent echocardiogram January 2015 showing ejection fraction 60%, moderately dilated left atrium, mild MR and TR, moderate pulmonary hypertension with right ventricular systolic pressure 53   Previous carotid Dopplers in January of 2008 showed 0-39% stenosis bilaterally. Carotid ultrasound may 2009 showing mild bilateral plaque, no significant stenoses  dyspnea with more extreme activities but not with routine activities. It is relieved with rest. It is not associated with chest pain.  He has not had palpitations, syncope or bleeding. He reports having chronic lower extremity edema. He takes Lasix 20 mg daily. Several recent admissions to the hospital dating back to December 2014 with urine output function, urinary tract infections, status post stent placement by Dr. Elenor Quinones. He reports that he is scheduled to have the stent removed at some point. He states that he urinates all day long Since January he reports having lost significant weight down for more than 200 to 185. Suspect this is from diuresis after ureter stent  Total cholesterol 109 EKG shows atrial fibrillation with rate 85 beats per minute, nonspecific ST and T wave abnormality in the anterolateral leads, inferior leads      Outpatient Encounter  Prescriptions as of 07/23/2013  Medication Sig  . acetaminophen (TYLENOL) 650 MG CR tablet Take 650 mg by mouth every 8 (eight) hours as needed.    Marland Kitchen albuterol (PROVENTIL HFA;VENTOLIN HFA) 108 (90 BASE) MCG/ACT inhaler Inhale 2 puffs into the lungs every 6 (six) hours as needed.    Marland Kitchen aspirin 81 MG tablet Take 81 mg by mouth daily.    Marland Kitchen atorvastatin (LIPITOR) 40 MG tablet Take 40 mg by mouth daily.    . bimatoprost (LUMIGAN) 0.03 % ophthalmic solution 1 drop at bedtime.    . budesonide-formoterol (SYMBICORT) 160-4.5 MCG/ACT inhaler Inhale 2 puffs into the lungs 2 (two) times daily.  . citalopram (CELEXA) 10 MG tablet Take 10 mg by mouth daily.    . furosemide (LASIX) 20 MG tablet Take 20 mg by mouth daily.    Marland Kitchen glimepiride (AMARYL) 4 MG tablet Take 4 mg by mouth daily with breakfast.  . isosorbide dinitrate (ISORDIL) 40 MG tablet Take 40 mg by mouth 3 (three) times daily.    Marland Kitchen lisinopril (PRINIVIL,ZESTRIL) 40 MG tablet Take 40 mg by mouth daily.    . metFORMIN (GLUCOPHAGE) 1000 MG tablet Take 1,000 mg by mouth 2 (two) times daily with a meal.    . metoprolol (TOPROL-XL) 50 MG 24 hr tablet Take 50 mg by mouth 2 (two) times daily.   . mometasone (NASONEX) 50 MCG/ACT nasal spray Place 2 sprays into the nose daily.    . nitroGLYCERIN (NITROSTAT) 0.4 MG SL tablet Place 0.4 mg under the tongue every 5 (five) minutes as needed.    . potassium chloride (KLOR-CON) 10 MEQ CR  tablet 2 tablets one day 3 tablets the next day  . tiotropium (SPIRIVA) 18 MCG inhalation capsule Place 18 mcg into inhaler and inhale daily.    Marland Kitchen warfarin (COUMADIN) 2 MG tablet Take 4 mg by mouth as directed. 2 tablets    Review of Systems  Constitutional: Negative.   HENT: Negative.   Eyes: Negative.   Respiratory: Positive for shortness of breath.   Cardiovascular: Positive for leg swelling.  Gastrointestinal: Negative.   Endocrine: Negative.   Musculoskeletal: Positive for gait problem.  Skin: Negative.    Allergic/Immunologic: Negative.   Neurological: Negative.   Hematological: Negative.   Psychiatric/Behavioral: Negative.   All other systems reviewed and are negative.   BP 132/82  Pulse 85  Ht 5\' 6"  (1.676 m)  Wt 184 lb 8 oz (83.689 kg)  BMI 29.79 kg/m2  Physical Exam  Nursing note and vitals reviewed. Constitutional: He is oriented to person, place, and time. He appears well-developed and well-nourished.  HENT:  Head: Normocephalic.  Nose: Nose normal.  Mouth/Throat: Oropharynx is clear and moist.  Eyes: Conjunctivae are normal. Pupils are equal, round, and reactive to light.  Neck: Normal range of motion. Neck supple. No JVD present.  Cardiovascular: Normal rate, S1 normal, S2 normal, normal heart sounds and intact distal pulses.  An irregularly irregular rhythm present. Exam reveals no gallop and no friction rub.   No murmur heard. 1+ pitting edema to the mid shins bilaterally  Pulmonary/Chest: Effort normal and breath sounds normal. No respiratory distress. He has no wheezes. He has no rales. He exhibits no tenderness.  Abdominal: Soft. Bowel sounds are normal. He exhibits no distension. There is no tenderness.  Musculoskeletal: Normal range of motion. He exhibits no edema and no tenderness.  Lymphadenopathy:    He has no cervical adenopathy.  Neurological: He is alert and oriented to person, place, and time. Coordination normal.  Skin: Skin is warm and dry. No rash noted. No erythema.  Psychiatric: He has a normal mood and affect. His behavior is normal. Judgment and thought content normal.      Assessment and Plan

## 2013-07-23 NOTE — Assessment & Plan Note (Signed)
Prior stent to the proximal RCA in 1997 at [facility]. Previous Myoview showing fixed defect. Currently with no symptoms of angina. No further testing at this time.

## 2013-07-23 NOTE — Assessment & Plan Note (Signed)
Suggested he closely followup with primary care for diabetes management. Hemoglobin A1c 7.9 Discussed the risk of complications from diabetes and worsening CAD

## 2013-07-23 NOTE — Assessment & Plan Note (Signed)
Blood pressure is well controlled on today's visit. No changes made to the medications. 

## 2013-07-23 NOTE — Patient Instructions (Signed)
You are doing well. No medication changes were made.  Please call us if you have new issues that need to be addressed before your next appt.  Your physician wants you to follow-up in: 6 months.  You will receive a reminder letter in the mail two months in advance. If you don't receive a letter, please call our office to schedule the follow-up appointment.   

## 2013-07-25 ENCOUNTER — Telehealth: Payer: Self-pay

## 2013-07-25 NOTE — Telephone Encounter (Signed)
Pt called and states his urologist states he has a kidney stone and wants to know if Dr. Rockey Situ thinks he should have it removed. States it is not bothering him. Please call.

## 2013-07-26 NOTE — Telephone Encounter (Signed)
Difficult to determine what to do Would defer decision to urology He would need to come off warfarin if surgery or lithotripsy needed If no symptoms and if urology does not think he is in immenant danger, Perhaps it could be monitored for now?

## 2013-07-27 NOTE — Telephone Encounter (Signed)
Left message for pt to call back  °

## 2013-07-30 NOTE — Telephone Encounter (Signed)
Spoke w/ pt.  He reports that Drs. Crenshaw and Masoud have both instructed him not to be put under anesthesia for any reason.  Advised him of Dr. Donivan Scull recommendation. He will let urology know that he has decided not to have lithotripsy at this time, as he does not have any symptoms. Pt to call w/ further questions or concerns.

## 2013-07-30 NOTE — Telephone Encounter (Signed)
Pt returned your call.  

## 2013-08-07 ENCOUNTER — Ambulatory Visit (INDEPENDENT_AMBULATORY_CARE_PROVIDER_SITE_OTHER): Payer: Medicare Other | Admitting: Internal Medicine

## 2013-08-07 ENCOUNTER — Encounter: Payer: Self-pay | Admitting: Internal Medicine

## 2013-08-07 VITALS — BP 124/78 | HR 74 | Temp 97.9°F | Wt 185.5 lb

## 2013-08-07 DIAGNOSIS — R350 Frequency of micturition: Secondary | ICD-10-CM

## 2013-08-07 DIAGNOSIS — R600 Localized edema: Secondary | ICD-10-CM

## 2013-08-07 DIAGNOSIS — R609 Edema, unspecified: Secondary | ICD-10-CM

## 2013-08-07 DIAGNOSIS — G4700 Insomnia, unspecified: Secondary | ICD-10-CM

## 2013-08-07 MED ORDER — ALPRAZOLAM 0.25 MG PO TABS: 0.2500 mg | ORAL_TABLET | Freq: Every evening | ORAL | Status: DC | PRN

## 2013-08-07 NOTE — Assessment & Plan Note (Signed)
He feels like he still needs the xanax today even though it is a low dose Medication refilled today

## 2013-08-07 NOTE — Progress Notes (Signed)
Subjective:    Patient ID: [name], male    DOB: September 25, 1931, 80 y.o.   MRN: [id number]  HPI  Pt presents to the clinic today with c/o insomnia and medication refill. He reports he has had insomnia for many years. He was prescribed xanax 0.25 mg for sleep and feels it is very effective. He only take a half a tab at a time. He would like a refill today.  Additionally, he is concerned about urinary frequency. He reports he voided 20 times yesterday. He did have a stent placed 05/2012. He is on lasix 20 mg daily for CHF and chronic BLE edema. He has followed up with his cardiologist who recommends he not stop the lasix. He also has a followup appt with urology 08/20/13.  Review of Systems      Past Medical History  Diagnosis Date  . MI   . MITRAL REGURGITATION   . HYPERTENSION   . HYPERLIPIDEMIA   . CVA   . CAD   . Atrial fibrillation   . Edema   . DM   . Stroke 1999  . History of kidney stones   . Neuropathy of both feet   . CHF (congestive heart failure)   . Kidney stone     Current Outpatient Prescriptions  Medication Sig Dispense Refill  . acetaminophen (TYLENOL) 650 MG CR tablet Take 650 mg by mouth every 8 (eight) hours as needed.        Marland Kitchen albuterol (2.5 MG/3ML) 0.083% NEBU 3 mL, albuterol (5 MG/ML) 0.5% NEBU 0.5 mL Inhale 1 mg into the lungs.      Marland Kitchen albuterol (PROVENTIL HFA;VENTOLIN HFA) 108 (90 BASE) MCG/ACT inhaler Inhale 2 puffs into the lungs every 6 (six) hours as needed.        . ALPRAZolam (XANAX) 0.25 MG tablet Take 0.25 mg by mouth at bedtime as needed for anxiety.      Marland Kitchen aspirin 81 MG tablet Take 81 mg by mouth daily.        Marland Kitchen atorvastatin (LIPITOR) 40 MG tablet Take 40 mg by mouth daily.        . bimatoprost (LUMIGAN) 0.03 % ophthalmic solution 1 drop at bedtime.        . budesonide-formoterol (SYMBICORT) 160-4.5 MCG/ACT inhaler Inhale 2 puffs into the lungs 2 (two) times daily.      . citalopram (CELEXA) 10 MG tablet Take 10 mg by mouth daily.          . furosemide (LASIX) 20 MG tablet Take 20 mg by mouth daily.        Marland Kitchen glimepiride (AMARYL) 4 MG tablet Take 4 mg by mouth daily with breakfast.      . isosorbide dinitrate (ISORDIL) 40 MG tablet Take 40 mg by mouth 3 (three) times daily.        Marland Kitchen lisinopril (PRINIVIL,ZESTRIL) 40 MG tablet Take 40 mg by mouth daily.        . metFORMIN (GLUCOPHAGE) 1000 MG tablet Take 1,000 mg by mouth 2 (two) times daily with a meal.        . metoprolol (TOPROL-XL) 50 MG 24 hr tablet Take 50 mg by mouth 2 (two) times daily.       . mometasone (NASONEX) 50 MCG/ACT nasal spray Place 2 sprays into the nose daily.        . nitroGLYCERIN (NITROSTAT) 0.4 MG SL tablet Place 0.4 mg under the tongue every 5 (five) minutes as needed.        Marland Kitchen  potassium chloride (KLOR-CON) 10 MEQ CR tablet 2 tablets one day 3 tablets the next day      . tiotropium (SPIRIVA) 18 MCG inhalation capsule Place 18 mcg into inhaler and inhale daily.        Marland Kitchen warfarin (COUMADIN) 2 MG tablet Take 4 mg by mouth as directed. 2 tablets       No current facility-administered medications for this visit.    Allergies  Allergen Reactions  . Morphine And Related Other (See Comments)    Hallucinations   . Niacin And Related Dermatitis    Family History  Problem Relation Age of Onset  . Heart disease Maternal Grandmother   . Diabetes Maternal Grandmother   . Cancer Neg Hx   . Stroke Neg Hx   . Heart disease Mother     History   Social History  . Marital Status: Married    Spouse Name: N/A    Number of Children: N/A  . Years of Education: N/A   Occupational History  . Not on file.   Social History Main Topics  . Smoking status: Former Smoker -- 2.00 packs/day for 40 years    Types: Cigarettes  . Smokeless tobacco: Former Systems developer    Quit date: 05/24/1990  . Alcohol Use: No  . Drug Use: No  . Sexual Activity: Not Currently   Other Topics Concern  . Not on file   Social History Narrative  . No narrative on file      Constitutional: Pt reports fatigue. Denies fever, malaise, headache or abrupt weight changes.  Respiratory: Denies difficulty breathing, shortness of breath, cough or sputum production.   Cardiovascular: Denies chest pain, chest tightness, palpitations or swelling in the hands or feet.    No other specific complaints in a complete review of systems (except as listed in HPI above).  Objective:   Physical Exam   BP 124/78  Pulse 74  Temp(Src) 97.9 F (36.6 C) (Oral)  Wt 185 lb 8 oz (84.142 kg)  SpO2 98% Wt Readings from Last 3 Encounters:  08/07/13 185 lb 8 oz (84.142 kg)  07/23/13 184 lb 8 oz (83.689 kg)  06/22/13 184 lb 8 oz (83.689 kg)    General: Appears his stated age, well developed, well nourished in NAD. Cardiovascular: Normal rate and rhythm. S1,S2 noted. Murmur noted.  No rubs or gallops noted. No JVD. 2+ BLE pitting edema noted.  No carotid bruits noted. Pulmonary/Chest: Normal effort and fine crackles noted in the bases. No respiratory distress. No wheezes, or ronchi noted.    BMET    Component Value Date/Time   NA 137 06/22/2013 1416   K 4.3 06/22/2013 1416   CL 104 06/22/2013 1416   CO2 27 06/22/2013 1416   GLUCOSE 150* 06/22/2013 1416   BUN 15 06/22/2013 1416   CREATININE 1.1 06/22/2013 1416   CALCIUM 9.2 06/22/2013 1416    Lipid Panel     Component Value Date/Time   CHOL 109 06/22/2013 1416   TRIG 192.0* 06/22/2013 1416   HDL 29.80* 06/22/2013 1416   CHOLHDL 4 06/22/2013 1416   VLDL 38.4 06/22/2013 1416   LDLCALC 41 06/22/2013 1416    CBC    Component Value Date/Time   WBC 12.2* 06/22/2013 1416   RBC 4.50 06/22/2013 1416   HGB 12.5* 06/22/2013 1416   HCT 38.1* 06/22/2013 1416   PLT 334.0 06/22/2013 1416   MCV 84.6 06/22/2013 1416   MCHC 32.9 06/22/2013 1416   RDW 17.6* 06/22/2013 1416  Hgb A1C Lab Results  Component Value Date   HGBA1C 7.9* 06/22/2013        Assessment & Plan:   Urinary frequency and peripheral edema:  Advised him not to  stop his lasix He does have a follow up appt 08/20/13 with urology to discuss possibly having stent removed  RTC as needed

## 2013-08-07 NOTE — Progress Notes (Signed)
Pre visit review using our clinic review tool, if applicable. No additional management support is needed unless otherwise documented below in the visit note. 

## 2013-08-07 NOTE — Patient Instructions (Addendum)

## 2013-08-14 ENCOUNTER — Ambulatory Visit: Payer: Self-pay | Admitting: Urology

## 2013-08-14 ENCOUNTER — Ambulatory Visit: Payer: Self-pay | Admitting: Family

## 2013-08-20 ENCOUNTER — Ambulatory Visit (INDEPENDENT_AMBULATORY_CARE_PROVIDER_SITE_OTHER): Payer: Medicare Other | Admitting: Family Medicine

## 2013-08-20 DIAGNOSIS — Z5181 Encounter for therapeutic drug level monitoring: Secondary | ICD-10-CM

## 2013-08-20 LAB — POCT INR: INR: 2.4

## 2013-08-25 ENCOUNTER — Emergency Department: Payer: Self-pay | Admitting: Emergency Medicine

## 2013-08-25 LAB — URINALYSIS, COMPLETE
Bilirubin,UR: NEGATIVE
Glucose,UR: 150 mg/dL (ref 0–75)
KETONE: NEGATIVE
Nitrite: POSITIVE
Ph: 5 (ref 4.5–8.0)
Protein: NEGATIVE
Specific Gravity: 1.01 (ref 1.003–1.030)
Squamous Epithelial: <1
WBC UR: 428 /HPF (ref 0–5)

## 2013-08-25 LAB — BASIC METABOLIC PANEL
Anion Gap: 7 (ref 7–16)
BUN: 20 mg/dL — ABNORMAL HIGH (ref 7–18)
CO2: 24 mmol/L (ref 21–32)
CREATININE: 1.51 mg/dL — AB (ref 0.60–1.30)
Calcium, Total: 8.7 mg/dL (ref 8.5–10.1)
Chloride: 103 mmol/L (ref 98–107)
EGFR (Non-African Amer.): 43 — ABNORMAL LOW
GFR CALC AF AMER: 50 — AB
Glucose: 183 mg/dL — ABNORMAL HIGH (ref 65–99)
Osmolality: 276 (ref 275–301)
Potassium: 3.9 mmol/L (ref 3.5–5.1)
SODIUM: 134 mmol/L — AB (ref 136–145)

## 2013-08-25 LAB — CBC WITH DIFFERENTIAL/PLATELET
COMMENT - H1-COM2: NORMAL
HCT: 37.9 % — ABNORMAL LOW (ref 40.0–52.0)
HGB: 12.7 g/dL — ABNORMAL LOW (ref 13.0–18.0)
Lymphocytes: 10 %
MCH: 28 pg (ref 26.0–34.0)
MCHC: 33.6 g/dL (ref 32.0–36.0)
MCV: 83 fL (ref 80–100)
Monocytes: 10 %
PLATELETS: 293 10*3/uL (ref 150–440)
RBC: 4.55 10*6/uL (ref 4.40–5.90)
RDW: 16.2 % — ABNORMAL HIGH (ref 11.5–14.5)
Segmented Neutrophils: 80 %
WBC: 16.9 10*3/uL — ABNORMAL HIGH (ref 3.8–10.6)

## 2013-08-25 LAB — PROTIME-INR
INR: 2.6
PROTHROMBIN TIME: 27.2 s — AB (ref 11.5–14.7)

## 2013-08-25 LAB — TROPONIN I: Troponin-I: <0.02 ng/mL

## 2013-08-26 LAB — URINE CULTURE

## 2013-09-04 ENCOUNTER — Other Ambulatory Visit: Payer: Self-pay | Admitting: Family

## 2013-09-04 ENCOUNTER — Ambulatory Visit: Payer: Self-pay | Admitting: Family

## 2013-09-04 LAB — HEMOGLOBIN: HGB: 10.9 g/dL — AB (ref 13.0–18.0)

## 2013-09-04 LAB — HEMATOCRIT: HCT: 33 % — AB (ref 40.0–52.0)

## 2013-09-04 LAB — PROTIME-INR
INR: 2.6
INR: 2.6 — AB (ref 0.9–1.1)
PROTHROMBIN TIME: 27 s — AB (ref 11.5–14.7)

## 2013-09-04 LAB — PRO B NATRIURETIC PEPTIDE: B-Type Natriuretic Peptide: 3132 pg/mL — ABNORMAL HIGH (ref 0–450)

## 2013-09-05 ENCOUNTER — Encounter: Payer: Self-pay | Admitting: Cardiovascular Disease

## 2013-09-06 ENCOUNTER — Ambulatory Visit: Payer: Self-pay | Admitting: Urology

## 2013-09-10 ENCOUNTER — Ambulatory Visit (INDEPENDENT_AMBULATORY_CARE_PROVIDER_SITE_OTHER): Payer: Medicare Other | Admitting: Internal Medicine

## 2013-09-10 ENCOUNTER — Encounter: Payer: Self-pay | Admitting: Internal Medicine

## 2013-09-10 VITALS — BP 114/64 | HR 80 | Temp 97.9°F | Wt 193.0 lb

## 2013-09-10 DIAGNOSIS — S301XXA Contusion of abdominal wall, initial encounter: Secondary | ICD-10-CM

## 2013-09-10 NOTE — Progress Notes (Signed)
Subjective:    Patient ID: [name], male    DOB: January 12, 1932, 80 y.o.   MRN: [id number]  HPI  Pt presents to the clinic today to f/u hematoma on left flanks s/p fall that occurred 08/25/13. The fall occurred at home in his bathroom. He did hit his head and lose conscousness for a few minutes. He was evaluated at [facility]. Imagine revealed no acute fracture. He has his INR checked which was 2.6. He wants to have it checked today because he reports that he is in a lot of pain. He has not noticed any blood in his urine. The hematome has appeared to go down in size per his wife.  Additionally, he reports he his supposed to undergo a stent removal as well as kidney stone removal next week in the urology office. He wants to make sure that this left flank hematoma will not interfere with his ability to have surgery.  Review of Systems      Past Medical History  Diagnosis Date  . MI   . MITRAL REGURGITATION   . HYPERTENSION   . HYPERLIPIDEMIA   . CVA   . CAD   . Atrial fibrillation   . Edema   . DM   . Stroke 1999  . History of kidney stones   . Neuropathy of both feet   . CHF (congestive heart failure)   . Kidney stone     Current Outpatient Prescriptions  Medication Sig Dispense Refill  . acetaminophen (TYLENOL) 650 MG CR tablet Take 650 mg by mouth every 8 (eight) hours as needed.        Marland Kitchen albuterol (2.5 MG/3ML) 0.083% NEBU 3 mL, albuterol (5 MG/ML) 0.5% NEBU 0.5 mL Inhale 1 mg into the lungs.      Marland Kitchen albuterol (PROVENTIL HFA;VENTOLIN HFA) 108 (90 BASE) MCG/ACT inhaler Inhale 2 puffs into the lungs every 6 (six) hours as needed.        . ALPRAZolam (XANAX) 0.25 MG tablet Take 1 tablet (0.25 mg total) by mouth at bedtime as needed for anxiety.  30 tablet  0  . aspirin 81 MG tablet Take 81 mg by mouth daily.        Marland Kitchen atorvastatin (LIPITOR) 40 MG tablet Take 40 mg by mouth daily.        . bimatoprost (LUMIGAN) 0.03 % ophthalmic solution 1 drop at bedtime.        .  budesonide-formoterol (SYMBICORT) 160-4.5 MCG/ACT inhaler Inhale 2 puffs into the lungs 2 (two) times daily.      . citalopram (CELEXA) 10 MG tablet Take 10 mg by mouth daily.        . furosemide (LASIX) 20 MG tablet Take 20 mg by mouth daily.        Marland Kitchen glimepiride (AMARYL) 4 MG tablet Take 4 mg by mouth daily with breakfast.      . isosorbide dinitrate (ISORDIL) 40 MG tablet Take 40 mg by mouth 3 (three) times daily.        Marland Kitchen lisinopril (PRINIVIL,ZESTRIL) 40 MG tablet Take 40 mg by mouth daily.        . metFORMIN (GLUCOPHAGE) 1000 MG tablet Take 1,000 mg by mouth 2 (two) times daily with a meal.        . metoprolol (TOPROL-XL) 50 MG 24 hr tablet Take 50 mg by mouth 2 (two) times daily.       . mometasone (NASONEX) 50 MCG/ACT nasal spray Place 2 sprays into the nose  daily.        . nitroGLYCERIN (NITROSTAT) 0.4 MG SL tablet Place 0.4 mg under the tongue every 5 (five) minutes as needed.        . potassium chloride (KLOR-CON) 10 MEQ CR tablet 2 tablets one day 3 tablets the next day      . tiotropium (SPIRIVA) 18 MCG inhalation capsule Place 18 mcg into inhaler and inhale daily.        Marland Kitchen warfarin (COUMADIN) 2 MG tablet Take 4 mg by mouth as directed. 2 tablets       No current facility-administered medications for this visit.    Allergies  Allergen Reactions  . Morphine And Related Other (See Comments)    Hallucinations   . Niacin And Related Dermatitis    Family History  Problem Relation Age of Onset  . Heart disease Maternal Grandmother   . Diabetes Maternal Grandmother   . Cancer Neg Hx   . Stroke Neg Hx   . Heart disease Mother     History   Social History  . Marital Status: Married    Spouse Name: N/A    Number of Children: N/A  . Years of Education: N/A   Occupational History  . Not on file.   Social History Main Topics  . Smoking status: Former Smoker -- 2.00 packs/day for 40 years    Types: Cigarettes  . Smokeless tobacco: Former Systems developer    Quit date: 05/24/1990    . Alcohol Use: No  . Drug Use: No  . Sexual Activity: Not Currently   Other Topics Concern  . Not on file   Social History Narrative  . No narrative on file     Constitutional: Denies fever, malaise, fatigue, headache or abrupt weight changes.  Gastrointestinal: Denies abdominal pain, bloating, constipation, diarrhea or blood in the stool.  GU: Denies urgency, frequency, pain with urination, burning sensation, blood in urine, odor or discharge. Skin: Pt reports left flank hematoma. Denies redness, rashes, lesions or ulcercations.    No other specific complaints in a complete review of systems (except as listed in HPI above).  Objective:   Physical Exam  BP 114/64  Pulse 80  Temp(Src) 97.9 F (36.6 C) (Oral)  Wt 193 lb (87.544 kg)  SpO2 98% Wt Readings from Last 3 Encounters:  09/10/13 193 lb (87.544 kg)  08/07/13 185 lb 8 oz (84.142 kg)  07/23/13 184 lb 8 oz (83.689 kg)    General: Appears his stated age, obese but well developed, well nourished in NAD. Skin: Warm, dry and intact. Very large, hard hematoma noted of left flank, tender to touch but not infected. Cardiovascular: Normal rate with irregular rhythm.  No murmur, rubs or gallops noted. No JVD or BLE edema. No carotid bruits noted. Pulmonary/Chest: Normal effort and positive vesicular breath sounds. No respiratory distress. No wheezes, rales or ronchi noted.   BMET    Component Value Date/Time   NA 137 06/22/2013 1416   K 4.3 06/22/2013 1416   CL 104 06/22/2013 1416   CO2 27 06/22/2013 1416   GLUCOSE 150* 06/22/2013 1416   BUN 15 06/22/2013 1416   CREATININE 1.1 06/22/2013 1416   CALCIUM 9.2 06/22/2013 1416    Lipid Panel     Component Value Date/Time   CHOL 109 06/22/2013 1416   TRIG 192.0* 06/22/2013 1416   HDL 29.80* 06/22/2013 1416   CHOLHDL 4 06/22/2013 1416   VLDL 38.4 06/22/2013 1416   LDLCALC 41 06/22/2013 1416  CBC    Component Value Date/Time   WBC 12.2* 06/22/2013 1416   RBC 4.50 06/22/2013  1416   HGB 12.5* 06/22/2013 1416   HCT 38.1* 06/22/2013 1416   PLT 334.0 06/22/2013 1416   MCV 84.6 06/22/2013 1416   MCHC 32.9 06/22/2013 1416   RDW 17.6* 06/22/2013 1416    Hgb A1C Lab Results  Component Value Date   HGBA1C 7.9* 06/22/2013         Assessment & Plan:   Hematoma of left flank:  Hospital notes, imaging, labs reviewed. Time to review a pprox 15 minutes Advised him that this may resolve on its own, may take 2-3 months due to size If no improvement in 3-4 weeks, will refer to gen surg/plastics to discuss surgical removal Continue tylenol for pain  RTC as needed or if symptoms persist or worsen

## 2013-09-10 NOTE — Progress Notes (Signed)
Pre visit review using our clinic review tool, if applicable. No additional management support is needed unless otherwise documented below in the visit note. 

## 2013-09-10 NOTE — Patient Instructions (Addendum)
Hematoma A hematoma is a collection of blood under the skin, in an organ, in a body space, in a joint space, or in other tissue. The blood can clot to form a lump that you can see and feel. The lump is often firm and may sometimes become sore and tender. Most hematomas get better in a few days to weeks. However, some hematomas may be serious and require medical care. Hematomas can range in size from very small to very large. CAUSES  A hematoma can be caused by a blunt or penetrating injury. It can also be caused by spontaneous leakage from a blood vessel under the skin. Spontaneous leakage from a blood vessel is more likely to occur in older people, especially those taking blood thinners. Sometimes, a hematoma can develop after certain medical procedures. SIGNS AND SYMPTOMS   A firm lump on the body.  Possible pain and tenderness in the area.  Bruising.Blue, dark blue, purple-red, or yellowish skin may appear at the site of the hematoma if the hematoma is close to the surface of the skin. For hematomas in deeper tissues or body spaces, the signs and symptoms may be subtle. For example, an intra-abdominal hematoma may cause abdominal pain, weakness, fainting, and shortness of breath. An intracranial hematoma may cause a headache or symptoms such as weakness, trouble speaking, or a change in consciousness. DIAGNOSIS  A hematoma can usually be diagnosed based on your medical history and a physical exam. Imaging tests may be needed if your health care provider suspects a hematoma in deeper tissues or body spaces, such as the abdomen, head, or chest. These tests may include ultrasonography or a CT scan.  TREATMENT  Hematomas usually go away on their own over time. Rarely does the blood need to be drained out of the body. Large hematomas or those that may affect vital organs will sometimes need surgical drainage or monitoring. HOME CARE INSTRUCTIONS   Apply ice to the injured area:   Put ice in a  plastic bag.   Place a towel between your skin and the bag.   Leave the ice on for 20 minutes, 2 3 times a day for the first 1 to 2 days.   After the first 2 days, switch to using warm compresses on the hematoma.   Elevate the injured area to help decrease pain and swelling. Wrapping the area with an elastic bandage may also be helpful. Compression helps to reduce swelling and promotes shrinking of the hematoma. Make sure the bandage is not wrapped too tight.   If your hematoma is on a lower extremity and is painful, crutches may be helpful for a couple days.   Only take over-the-counter or prescription medicines as directed by your health care provider. SEEK IMMEDIATE MEDICAL CARE IF:   You have increasing pain, or your pain is not controlled with medicine.   You have a fever.   You have worsening swelling or discoloration.   Your skin over the hematoma breaks or starts bleeding.   Your hematoma is in your chest or abdomen and you have weakness, shortness of breath, or a change in consciousness.  Your hematoma is on your scalp (caused by a fall or injury) and you have a worsening headache or a change in alertness or consciousness. MAKE SURE YOU:   Understand these instructions.  Will watch your condition.  Will get help right away if you are not doing well or get worse. Document Released: 11/11/2003 Document Revised: 11/29/2012 Document Reviewed:   09/06/2012 Exit[employer] Patient Information 2014 [employer].  

## 2013-09-14 ENCOUNTER — Ambulatory Visit: Payer: Self-pay | Admitting: Family

## 2013-09-14 DIAGNOSIS — G4733 Obstructive sleep apnea (adult) (pediatric): Secondary | ICD-10-CM | POA: Insufficient documentation

## 2013-09-14 DIAGNOSIS — J449 Chronic obstructive pulmonary disease, unspecified: Secondary | ICD-10-CM | POA: Insufficient documentation

## 2013-09-17 ENCOUNTER — Ambulatory Visit (INDEPENDENT_AMBULATORY_CARE_PROVIDER_SITE_OTHER): Payer: Medicare Other | Admitting: Family Medicine

## 2013-09-17 DIAGNOSIS — Z5181 Encounter for therapeutic drug level monitoring: Secondary | ICD-10-CM

## 2013-09-17 LAB — POCT INR: INR: 3.1

## 2013-09-18 ENCOUNTER — Telehealth: Payer: Self-pay | Admitting: *Deleted

## 2013-09-18 NOTE — Telephone Encounter (Signed)
Faxed signed order for BNP to [facility]

## 2013-09-24 ENCOUNTER — Telehealth: Payer: Self-pay

## 2013-09-24 ENCOUNTER — Encounter: Payer: Self-pay | Admitting: Cardiovascular Disease

## 2013-09-24 ENCOUNTER — Ambulatory Visit (INDEPENDENT_AMBULATORY_CARE_PROVIDER_SITE_OTHER): Payer: Medicare Other | Admitting: Cardiovascular Disease

## 2013-09-24 VITALS — BP 120/80 | HR 71 | Ht 65.0 in | Wt 186.0 lb

## 2013-09-24 DIAGNOSIS — E785 Hyperlipidemia, unspecified: Secondary | ICD-10-CM

## 2013-09-24 DIAGNOSIS — T148XXA Other injury of unspecified body region, initial encounter: Secondary | ICD-10-CM

## 2013-09-24 DIAGNOSIS — Z9181 History of falling: Secondary | ICD-10-CM

## 2013-09-24 DIAGNOSIS — R296 Repeated falls: Secondary | ICD-10-CM | POA: Insufficient documentation

## 2013-09-24 DIAGNOSIS — I10 Essential (primary) hypertension: Secondary | ICD-10-CM

## 2013-09-24 DIAGNOSIS — I4891 Unspecified atrial fibrillation: Secondary | ICD-10-CM

## 2013-09-24 DIAGNOSIS — I2510 Atherosclerotic heart disease of native coronary artery without angina pectoris: Secondary | ICD-10-CM

## 2013-09-24 NOTE — Assessment & Plan Note (Signed)
Blood pressure is well controlled on today's visit. No changes made to the medications. 

## 2013-09-24 NOTE — Assessment & Plan Note (Signed)
Currently with no symptoms of angina. No further workup at this time. Continue current medication regimen. 

## 2013-09-24 NOTE — Assessment & Plan Note (Signed)
Cholesterol is at goal on the current lipid regimen. No changes to the medications were made.  

## 2013-09-24 NOTE — Patient Instructions (Signed)
You are doing well. No medication changes were made.  Please call if you have more falls  Please call us if you have new issues that need to be addressed before your next appt.  Your physician wants you to follow-up in: 3 months.

## 2013-09-24 NOTE — Telephone Encounter (Signed)
l mom for pt to schedule 3 month f/u

## 2013-09-24 NOTE — Assessment & Plan Note (Signed)
Recent fall with subsequent hematoma. Resolving left flank hematoma on today's visit, still quite large. Still with mild tenderness.

## 2013-09-24 NOTE — Assessment & Plan Note (Signed)
Heart rate is relatively well-controlled. We spent most of today talking about his recent falls over the past several months and his anticoagulation. After long discussion of risk and benefit, he will stay on warfarin for now. He will call our office if he has additional falls.

## 2013-09-24 NOTE — Progress Notes (Signed)
Patient ID: [name], male    DOB: Jun 19, 1931, 80 y.o.   MRN: [id number]  HPI Comments: [name] is a pleasant gentleman, who has a history of diabetes type 2, coronary artery disease, status post myocardial infarction treated with TPA at [facility] in January 29, 1996.   He has chronic atrial fibrillation since 2006, on warfarin. Walks with a cane. (1999 had a cva)  In followup today, he reports having several recent falls. He fell in January after taking a nap, he fell into his dresser drawer. Significant trauma to his left arm He fell possibly in April the details are unavailable. Did not hurt himself [place] again recently while in the bathroom. Lost his footing, fell backwards, broke the toilet lid, large left flank hematoma  Reports that he was seen in heart failure clinic, since then has been doing better. He's been taking Lasix 20 mg daily, continues to drink significant fluids. Abdomen is less swollen, leg edema is better though still has mild edema. Reports he was very short of breath recently, last week was started on prednisone with improvement of his symptoms. Seen by Dr. Christy Gentles in March 2007 showed an ejection fraction of 50% with an old scar at the apex and mild peri-infarct ischemia.   echocardiogram in January 2008 showed an ejection fraction at the lower limits of normal with moderate LVH, mild aortic root dilatation, and mild mitral regurgitation.  There was biatrial enlargement.   echocardiogram January 2015 showing ejection fraction 60%, moderately dilated left atrium, mild MR and TR, moderate pulmonary hypertension with right ventricular systolic pressure 53   Previous carotid Dopplers in January of 2008 showed 0-39% stenosis bilaterally. Carotid ultrasound may 2009 showing mild bilateral plaque, no significant stenoses  Several admissions to the hospital dating back to December 2014 with urine output function, urinary tract infections, status post  stent placement by Dr. Elenor Quinones.  Total cholesterol 109 EKG shows atrial fibrillation with rate 71 beats per minute, nonspecific ST and T wave abnormality in the anterolateral leads, inferior leads      Outpatient Encounter Prescriptions as of 09/24/2013  Medication Sig  . acetaminophen (TYLENOL) 650 MG CR tablet Take 650 mg by mouth every 8 (eight) hours as needed.    Marland Kitchen albuterol (2.5 MG/3ML) 0.083% NEBU 3 mL, albuterol (5 MG/ML) 0.5% NEBU 0.5 mL Inhale 1 mg into the lungs.  Marland Kitchen albuterol (PROVENTIL HFA;VENTOLIN HFA) 108 (90 BASE) MCG/ACT inhaler Inhale 2 puffs into the lungs every 6 (six) hours as needed.    . ALPRAZolam (XANAX) 0.25 MG tablet Take 1 tablet (0.25 mg total) by mouth at bedtime as needed for anxiety.  Marland Kitchen aspirin 81 MG tablet Take 81 mg by mouth daily.    Marland Kitchen atorvastatin (LIPITOR) 40 MG tablet Take 40 mg by mouth daily.    . bimatoprost (LUMIGAN) 0.03 % ophthalmic solution Place 1 drop into both eyes at bedtime.   . budesonide-formoterol (SYMBICORT) 160-4.5 MCG/ACT inhaler Inhale 2 puffs into the lungs 2 (two) times daily.  . citalopram (CELEXA) 10 MG tablet Take 10 mg by mouth daily.    . furosemide (LASIX) 20 MG tablet Take 20 mg by mouth daily.    Marland Kitchen glimepiride (AMARYL) 4 MG tablet Take 4 mg in the am with 8 mg in the pm.  . isosorbide dinitrate (ISORDIL) 40 MG tablet Take 40 mg by mouth 3 (three) times daily.    Marland Kitchen lisinopril (PRINIVIL,ZESTRIL) 40 MG tablet Take 40 mg by mouth  daily.    . metFORMIN (GLUCOPHAGE) 1000 MG tablet Take 1,000 mg by mouth 2 (two) times daily with a meal.    . metoprolol (TOPROL-XL) 50 MG 24 hr tablet Take 50 mg by mouth 2 (two) times daily.   . mometasone (NASONEX) 50 MCG/ACT nasal spray Place 2 sprays into the nose daily.    . nitroGLYCERIN (NITROSTAT) 0.4 MG SL tablet Place 0.4 mg under the tongue every 5 (five) minutes as needed.    . potassium chloride (KLOR-CON) 10 MEQ CR tablet 2 tablets one day 3 tablets the next day  . tiotropium (SPIRIVA) 18  MCG inhalation capsule Place 18 mcg into inhaler and inhale daily.    Marland Kitchen warfarin (COUMADIN) 2 MG tablet Take 4 mg by mouth as directed. 2 tablets    Review of Systems  Constitutional: Negative.   HENT: Negative.   Eyes: Negative.   Cardiovascular: Positive for leg swelling.  Gastrointestinal: Negative.   Endocrine: Negative.   Musculoskeletal: Positive for gait problem.  Skin: Negative.   Allergic/Immunologic: Negative.   Neurological: Negative.   Hematological: Negative.   Psychiatric/Behavioral: Negative.   All other systems reviewed and are negative.   BP 120/80  Pulse 71  Ht 5\' 5"  (1.651 m)  Wt 186 lb (84.369 kg)  BMI 30.95 kg/m2  Physical Exam  Nursing note and vitals reviewed. Constitutional: He is oriented to person, place, and time. He appears well-developed and well-nourished.  HENT:  Head: Normocephalic.  Nose: Nose normal.  Mouth/Throat: Oropharynx is clear and moist.  Eyes: Conjunctivae are normal. Pupils are equal, round, and reactive to light.  Neck: Normal range of motion. Neck supple. No JVD present.  Cardiovascular: Normal rate, S1 normal, S2 normal, normal heart sounds and intact distal pulses.  An irregularly irregular rhythm present. Exam reveals no gallop and no friction rub.   No murmur heard. 1+ pitting edema to the mid shins bilaterally  Pulmonary/Chest: Effort normal and breath sounds normal. No respiratory distress. He has no wheezes. He has no rales. He exhibits no tenderness.  Abdominal: Soft. Bowel sounds are normal. He exhibits no distension. There is no tenderness.  Musculoskeletal: Normal range of motion. He exhibits no edema and no tenderness.  Large left back/flank resolving hematoma, no discoloration noted, only swelling  Lymphadenopathy:    He has no cervical adenopathy.  Neurological: He is alert and oriented to person, place, and time. Coordination normal.  Skin: Skin is warm and dry. No rash noted. No erythema.  Psychiatric: He has  a normal mood and affect. His behavior is normal. Judgment and thought content normal.      Assessment and Plan

## 2013-09-24 NOTE — Assessment & Plan Note (Signed)
3 falls this year, 2 with major trauma. Suggested if he has additional falls, we consider decreasing his warfarin dosing to avoid risk.

## 2013-10-15 ENCOUNTER — Ambulatory Visit: Payer: Medicare Other

## 2013-10-16 ENCOUNTER — Ambulatory Visit: Payer: Self-pay | Admitting: Family

## 2013-10-19 ENCOUNTER — Ambulatory Visit: Payer: Self-pay | Admitting: Urology

## 2013-10-29 ENCOUNTER — Ambulatory Visit (INDEPENDENT_AMBULATORY_CARE_PROVIDER_SITE_OTHER): Payer: Medicare Other | Admitting: Family Medicine

## 2013-10-29 DIAGNOSIS — Z5181 Encounter for therapeutic drug level monitoring: Secondary | ICD-10-CM

## 2013-10-29 LAB — POCT INR: INR: 2.2

## 2013-10-30 ENCOUNTER — Other Ambulatory Visit: Payer: Self-pay

## 2013-10-30 DIAGNOSIS — G4700 Insomnia, unspecified: Secondary | ICD-10-CM

## 2013-10-30 MED ORDER — ALPRAZOLAM 0.25 MG PO TABS: 0.2500 mg | ORAL_TABLET | Freq: Every evening | ORAL | Status: DC | PRN

## 2013-10-30 NOTE — Telephone Encounter (Signed)
Rx called in to pharmacy. 

## 2013-10-30 NOTE — Telephone Encounter (Signed)
Office policy: we do not give refills on controlled substances. Ok to phone in if he needs refill now

## 2013-10-30 NOTE — Telephone Encounter (Signed)
Pt left v/m requesting 90 day supply with refills for alprazolam [facility]. Pt request cb when refilled.Please advise.

## 2013-11-16 ENCOUNTER — Emergency Department: Payer: Self-pay | Admitting: Emergency Medicine

## 2013-11-16 LAB — BASIC METABOLIC PANEL
Anion Gap: 10 (ref 7–16)
BUN: 26 mg/dL — ABNORMAL HIGH (ref 7–18)
CO2: 26 mmol/L (ref 21–32)
Calcium, Total: 9 mg/dL (ref 8.5–10.1)
Chloride: 102 mmol/L (ref 98–107)
Creatinine: 1.33 mg/dL — ABNORMAL HIGH (ref 0.60–1.30)
EGFR (African American): 59 — ABNORMAL LOW
EGFR (Non-African Amer.): 50 — ABNORMAL LOW
Glucose: 244 mg/dL — ABNORMAL HIGH (ref 65–99)
Osmolality: 289 (ref 275–301)
Potassium: 3.8 mmol/L (ref 3.5–5.1)
Sodium: 138 mmol/L (ref 136–145)

## 2013-11-16 LAB — CBC
HCT: 41.9 % (ref 40.0–52.0)
HGB: 13.3 g/dL (ref 13.0–18.0)
MCH: 27 pg (ref 26.0–34.0)
MCHC: 31.8 g/dL — ABNORMAL LOW (ref 32.0–36.0)
MCV: 85 fL (ref 80–100)
Platelet: 236 10*3/uL (ref 150–440)
RBC: 4.93 10*6/uL (ref 4.40–5.90)
RDW: 17 % — ABNORMAL HIGH (ref 11.5–14.5)
WBC: 14.1 10*3/uL — ABNORMAL HIGH (ref 3.8–10.6)

## 2013-11-16 LAB — PROTIME-INR
INR: 2
Prothrombin Time: 22 secs — ABNORMAL HIGH (ref 11.5–14.7)

## 2013-11-22 ENCOUNTER — Other Ambulatory Visit: Payer: Self-pay

## 2013-11-22 ENCOUNTER — Encounter: Payer: Self-pay | Admitting: Internal Medicine

## 2013-11-22 ENCOUNTER — Ambulatory Visit (INDEPENDENT_AMBULATORY_CARE_PROVIDER_SITE_OTHER): Payer: Medicare Other | Admitting: Internal Medicine

## 2013-11-22 VITALS — BP 136/72 | HR 73 | Temp 97.7°F | Wt 193.0 lb

## 2013-11-22 DIAGNOSIS — L03116 Cellulitis of left lower limb: Secondary | ICD-10-CM

## 2013-11-22 DIAGNOSIS — L02619 Cutaneous abscess of unspecified foot: Secondary | ICD-10-CM

## 2013-11-22 DIAGNOSIS — S51009A Unspecified open wound of unspecified elbow, initial encounter: Secondary | ICD-10-CM

## 2013-11-22 DIAGNOSIS — Y92009 Unspecified place in unspecified non-institutional (private) residence as the place of occurrence of the external cause: Secondary | ICD-10-CM

## 2013-11-22 DIAGNOSIS — W19XXXD Unspecified fall, subsequent encounter: Secondary | ICD-10-CM

## 2013-11-22 DIAGNOSIS — W19XXXA Unspecified fall, initial encounter: Secondary | ICD-10-CM

## 2013-11-22 DIAGNOSIS — L03119 Cellulitis of unspecified part of limb: Secondary | ICD-10-CM

## 2013-11-22 DIAGNOSIS — S51012D Laceration without foreign body of left elbow, subsequent encounter: Secondary | ICD-10-CM

## 2013-11-22 DIAGNOSIS — Z5189 Encounter for other specified aftercare: Secondary | ICD-10-CM

## 2013-11-22 MED ORDER — CEFTRIAXONE SODIUM 1 G IJ SOLR
1.0000 g | Freq: Once | INTRAMUSCULAR | Status: AC
  Administered 2013-11-22: 1 g via INTRAMUSCULAR

## 2013-11-22 MED ORDER — AMOXICILLIN-POT CLAVULANATE 875-125 MG PO TABS: 1.0000 | ORAL_TABLET | Freq: Two times a day (BID) | ORAL | Status: DC

## 2013-11-22 NOTE — Progress Notes (Signed)
Pre visit review using our clinic review tool, if applicable. No additional management support is needed unless otherwise documented below in the visit note. 

## 2013-11-22 NOTE — Patient Instructions (Addendum)

## 2013-11-22 NOTE — Progress Notes (Signed)
Subjective:    Patient ID: [name]., male    DOB: Jul 09, 1931, 80 y.o.   MRN: [id number]  HPI  Pt presents to the clinic today for ER followup. He went to [facility] 11/16/13  s/p fall. He did sustain a skin tear to the left elbow and a sprain of his left ankle. He did finish the prophylactic antibiotics given to him at the ER. Since that time, he reports the toes on his left foot are darker and more swollen. He has also noted a area on his foot that is very red, warm and tender to touch. He denies fever.   Review of Systems      Past Medical History  Diagnosis Date  . MI   . MITRAL REGURGITATION   . HYPERTENSION   . HYPERLIPIDEMIA   . CVA   . CAD   . Atrial fibrillation   . Edema   . DM   . Stroke 1999  . History of kidney stones   . Neuropathy of both feet   . CHF (congestive heart failure)   . Kidney stone     Current Outpatient Prescriptions  Medication Sig Dispense Refill  . acetaminophen (TYLENOL) 650 MG CR tablet Take 650 mg by mouth every 8 (eight) hours as needed.        Marland Kitchen albuterol (2.5 MG/3ML) 0.083% NEBU 3 mL, albuterol (5 MG/ML) 0.5% NEBU 0.5 mL Inhale 1 mg into the lungs.      Marland Kitchen albuterol (PROVENTIL HFA;VENTOLIN HFA) 108 (90 BASE) MCG/ACT inhaler Inhale 2 puffs into the lungs every 6 (six) hours as needed.        . ALPRAZolam (XANAX) 0.25 MG tablet Take 1 tablet (0.25 mg total) by mouth at bedtime as needed for anxiety.  30 tablet  0  . aspirin 81 MG tablet Take 81 mg by mouth daily.        Marland Kitchen atorvastatin (LIPITOR) 40 MG tablet Take 40 mg by mouth daily.        . bimatoprost (LUMIGAN) 0.03 % ophthalmic solution Place 1 drop into both eyes at bedtime.       . budesonide-formoterol (SYMBICORT) 160-4.5 MCG/ACT inhaler Inhale 2 puffs into the lungs 2 (two) times daily.      . citalopram (CELEXA) 10 MG tablet Take 10 mg by mouth daily.        . furosemide (LASIX) 20 MG tablet Take 20 mg by mouth daily.        Marland Kitchen glimepiride (AMARYL) 4 MG tablet Take 4 mg in the  am with 8 mg in the pm.      . isosorbide dinitrate (ISORDIL) 40 MG tablet Take 40 mg by mouth 3 (three) times daily.        Marland Kitchen lisinopril (PRINIVIL,ZESTRIL) 40 MG tablet Take 40 mg by mouth daily.        . metFORMIN (GLUCOPHAGE) 1000 MG tablet Take 1,000 mg by mouth 2 (two) times daily with a meal.        . metoprolol (TOPROL-XL) 50 MG 24 hr tablet Take 50 mg by mouth 2 (two) times daily.       . mometasone (NASONEX) 50 MCG/ACT nasal spray Place 2 sprays into the nose daily.        . nitroGLYCERIN (NITROSTAT) 0.4 MG SL tablet Place 0.4 mg under the tongue every 5 (five) minutes as needed.        . potassium chloride (KLOR-CON) 10 MEQ CR tablet 2 tablets one  day 3 tablets the next day      . predniSONE (DELTASONE) 10 MG tablet Take 10 mg by mouth daily with breakfast.      . tiotropium (SPIRIVA) 18 MCG inhalation capsule Place 18 mcg into inhaler and inhale daily.        Marland Kitchen warfarin (COUMADIN) 2 MG tablet Take 4 mg by mouth as directed. 2 tablets       No current facility-administered medications for this visit.    Allergies  Allergen Reactions  . Morphine And Related Other (See Comments)    Hallucinations   . Niacin And Related Dermatitis    Family History  Problem Relation Age of Onset  . Heart disease Maternal Grandmother   . Diabetes Maternal Grandmother   . Cancer Neg Hx   . Stroke Neg Hx   . Heart disease Mother     History   Social History  . Marital Status: Married    Spouse Name: N/A    Number of Children: N/A  . Years of Education: N/A   Occupational History  . Not on file.   Social History Main Topics  . Smoking status: Former Smoker -- 2.00 packs/day for 40 years    Types: Cigarettes  . Smokeless tobacco: Former Systems developer    Quit date: 05/24/1990  . Alcohol Use: No  . Drug Use: No  . Sexual Activity: Not Currently   Other Topics Concern  . Not on file   Social History Narrative  . No narrative on file     Constitutional: Denies fever, malaise, fatigue,  headache or abrupt weight changes.  Musculoskeletal: Pt reports swelling of left foot. Denies decrease in range of motion, difficulty with gait, muscle pain .  Skin: Pt reports swelling and bruising of toes of left foot. Denies redness, rashes, lesions or ulcercations.    No other specific complaints in a complete review of systems (except as listed in HPI above).  Objective:   Physical Exam  BP 136/72  Pulse 73  Temp(Src) 97.7 F (36.5 C) (Oral)  Wt 193 lb (87.544 kg)  SpO2 96% Wt Readings from Last 3 Encounters:  11/22/13 193 lb (87.544 kg)  09/24/13 186 lb (84.369 kg)  09/10/13 193 lb (87.544 kg)    General: Appears his stated age, obese but well developed, well nourished in NAD. Skin: Bruising noted of toes on left foot. Large area of cellulitis covering dorsal part of left foot. Warm and tender to touch.    BMET    Component Value Date/Time   NA 137 06/22/2013 1416   K 4.3 06/22/2013 1416   CL 104 06/22/2013 1416   CO2 27 06/22/2013 1416   GLUCOSE 150* 06/22/2013 1416   BUN 15 06/22/2013 1416   CREATININE 1.1 06/22/2013 1416   CALCIUM 9.2 06/22/2013 1416    Lipid Panel     Component Value Date/Time   CHOL 109 06/22/2013 1416   TRIG 192.0* 06/22/2013 1416   HDL 29.80* 06/22/2013 1416   CHOLHDL 4 06/22/2013 1416   VLDL 38.4 06/22/2013 1416   LDLCALC 41 06/22/2013 1416    CBC    Component Value Date/Time   WBC 12.2* 06/22/2013 1416   RBC 4.50 06/22/2013 1416   HGB 12.5* 06/22/2013 1416   HCT 38.1* 06/22/2013 1416   PLT 334.0 06/22/2013 1416   MCV 84.6 06/22/2013 1416   MCHC 32.9 06/22/2013 1416   RDW 17.6* 06/22/2013 1416    Hgb A1C Lab Results  Component Value Date  HGBA1C 7.9* 06/22/2013         Assessment & Plan:  Fall at home:  Skin tear to left elbow-dressing CDI Finished keflex  Bruising to left foot-likely d/t coumadin Continue ice and elevation Should resolve with time  Cellulitis of left foot:  1 gm rocephin IM today Augmentin BID x 10  days Watch for fever, increased redness, swelling or pain  RTC as needed

## 2013-11-22 NOTE — Addendum Note (Signed)
Addended by: Lurlean Nanny on: 11/22/2013 03:06 PM   Modules accepted: Orders

## 2013-11-23 ENCOUNTER — Encounter: Payer: Self-pay | Admitting: Internal Medicine

## 2013-11-23 ENCOUNTER — Ambulatory Visit (INDEPENDENT_AMBULATORY_CARE_PROVIDER_SITE_OTHER): Payer: Medicare Other | Admitting: Internal Medicine

## 2013-11-23 VITALS — BP 128/80 | HR 69 | Temp 98.4°F | Wt 190.0 lb

## 2013-11-23 DIAGNOSIS — L03119 Cellulitis of unspecified part of limb: Secondary | ICD-10-CM

## 2013-11-23 DIAGNOSIS — L02619 Cutaneous abscess of unspecified foot: Secondary | ICD-10-CM

## 2013-11-23 DIAGNOSIS — L03116 Cellulitis of left lower limb: Secondary | ICD-10-CM

## 2013-11-23 MED ORDER — CEFTRIAXONE SODIUM 1 G IJ SOLR
1.0000 g | Freq: Once | INTRAMUSCULAR | Status: AC
  Administered 2013-11-23: 1 g via INTRAMUSCULAR

## 2013-11-23 MED ORDER — LEVOFLOXACIN 500 MG PO TABS: 500.0000 mg | ORAL_TABLET | Freq: Every day | ORAL | Status: DC

## 2013-11-23 NOTE — Assessment & Plan Note (Signed)
In diabetic Exam complicated by obvious venous congestion (despite keeping his feet up) Does seem to be improved Will continue the augmentin Rocephin again Add levaquin for broader coverage

## 2013-11-23 NOTE — Patient Instructions (Signed)
Please set up a protime on Monday or Tuesday with Blair--due to treatment with the levofloxacin. Please decrease your coumadin to 4mg  alternating with 2mg  every other day while you are on the levofloxacin.

## 2013-11-23 NOTE — Progress Notes (Signed)
Pre visit review using our clinic review tool, if applicable. No additional management support is needed unless otherwise documented below in the visit note. 

## 2013-11-23 NOTE — Addendum Note (Signed)
Addended by: Despina Hidden on: 11/23/2013 11:54 AM   Modules accepted: Orders

## 2013-11-23 NOTE — Progress Notes (Signed)
Subjective:    Patient ID: [name]., male    DOB: 07/23/1931, 80 y.o.   MRN: [id number]  HPI Here with wife  Seen first in ER 1 week ago-- x-ray was okay of foot  Fell and skinned left arm but doesn't remember an injury to the foot He may have bent up his forefoot Arm is improving but foot worse  Has had ongoing swelling despite compression hose Redness is worsening on the foot in general--but the circled area (from yesterday) is better Now having more pain though when walking  No fever  Current Outpatient Prescriptions on File Prior to Visit  Medication Sig Dispense Refill  . acetaminophen (TYLENOL) 650 MG CR tablet Take 650 mg by mouth every 8 (eight) hours as needed.        Marland Kitchen albuterol (2.5 MG/3ML) 0.083% NEBU 3 mL, albuterol (5 MG/ML) 0.5% NEBU 0.5 mL Inhale 1 mg into the lungs.      Marland Kitchen albuterol (PROVENTIL HFA;VENTOLIN HFA) 108 (90 BASE) MCG/ACT inhaler Inhale 2 puffs into the lungs every 6 (six) hours as needed.        . ALPRAZolam (XANAX) 0.25 MG tablet Take 1 tablet (0.25 mg total) by mouth at bedtime as needed for anxiety.  30 tablet  0  . amoxicillin-clavulanate (AUGMENTIN) 875-125 MG per tablet Take 1 tablet by mouth 2 (two) times daily.  20 tablet  0  . aspirin 81 MG tablet Take 81 mg by mouth daily.        Marland Kitchen atorvastatin (LIPITOR) 40 MG tablet Take 40 mg by mouth daily.        . bimatoprost (LUMIGAN) 0.03 % ophthalmic solution Place 1 drop into both eyes at bedtime.       . budesonide-formoterol (SYMBICORT) 160-4.5 MCG/ACT inhaler Inhale 2 puffs into the lungs 2 (two) times daily.      . citalopram (CELEXA) 10 MG tablet Take 10 mg by mouth daily.        . furosemide (LASIX) 20 MG tablet Take 20 mg by mouth daily.        Marland Kitchen glimepiride (AMARYL) 4 MG tablet Take 4 mg in the am with 8 mg in the pm.      . isosorbide dinitrate (ISORDIL) 40 MG tablet Take 40 mg by mouth 3 (three) times daily.        Marland Kitchen lisinopril (PRINIVIL,ZESTRIL) 40 MG tablet Take 40 mg by mouth  daily.        . metFORMIN (GLUCOPHAGE) 1000 MG tablet Take 1,000 mg by mouth 2 (two) times daily with a meal.        . metoprolol (TOPROL-XL) 50 MG 24 hr tablet Take 50 mg by mouth 2 (two) times daily.       . mometasone (NASONEX) 50 MCG/ACT nasal spray Place 2 sprays into the nose daily.        . nitroGLYCERIN (NITROSTAT) 0.4 MG SL tablet Place 0.4 mg under the tongue every 5 (five) minutes as needed.        . potassium chloride (KLOR-CON) 10 MEQ CR tablet 2 tablets one day 3 tablets the next day      . predniSONE (DELTASONE) 10 MG tablet Take 10 mg by mouth daily with breakfast.      . tiotropium (SPIRIVA) 18 MCG inhalation capsule Place 18 mcg into inhaler and inhale daily.        Marland Kitchen warfarin (COUMADIN) 2 MG tablet Take 4 mg by mouth as directed. 2 tablets  No current facility-administered medications on file prior to visit.    Allergies  Allergen Reactions  . Morphine And Related Other (See Comments)    Hallucinations   . Niacin And Related Dermatitis    Past Medical History  Diagnosis Date  . MI   . MITRAL REGURGITATION   . HYPERTENSION   . HYPERLIPIDEMIA   . CVA   . CAD   . Atrial fibrillation   . Edema   . DM   . Stroke 1999  . History of kidney stones   . Neuropathy of both feet   . CHF (congestive heart failure)   . Kidney stone     Past Surgical History  Procedure Laterality Date  . Tonsillectomy and adenoidectomy  1959  . Carotid stent insertion  1997  . Cardiac catheterization  1997    [facility]  . Coronary angioplasty  1997    s/p stent placement x 2   . Kidney surgery  05/2013    s/p stent placement   . Bladder surgery      stent placement     Family History  Problem Relation Age of Onset  . Heart disease Maternal Grandmother   . Diabetes Maternal Grandmother   . Cancer Neg Hx   . Stroke Neg Hx   . Heart disease Mother     History   Social History  . Marital Status: Married    Spouse Name: N/A    Number of Children: N/A  . Years of  Education: N/A   Occupational History  . Not on file.   Social History Main Topics  . Smoking status: Former Smoker -- 2.00 packs/day for 40 years    Types: Cigarettes  . Smokeless tobacco: Former Systems developer    Quit date: 05/24/1990  . Alcohol Use: No  . Drug Use: No  . Sexual Activity: Not Currently   Other Topics Concern  . Not on file   Social History Narrative  . No narrative on file   Review of Systems No fever No sweats or chills Appetite is okay     Objective:   Physical Exam  Constitutional: He appears well-developed and well-nourished. No distress.  Musculoskeletal:  2+ pitting edema in left foot Purplish discoloration in toes looks like venous congestion. Mild diffuse redness of entire foot and slightly up calf also looks venous There is improvement in the red area of the circle from yesterday--with lightening of the redness           Assessment & Plan:

## 2013-11-26 ENCOUNTER — Encounter: Payer: Self-pay | Admitting: Internal Medicine

## 2013-11-26 ENCOUNTER — Ambulatory Visit (INDEPENDENT_AMBULATORY_CARE_PROVIDER_SITE_OTHER): Payer: Medicare Other | Admitting: Family Medicine

## 2013-11-26 ENCOUNTER — Ambulatory Visit: Payer: Medicare Other

## 2013-11-26 ENCOUNTER — Ambulatory Visit (INDEPENDENT_AMBULATORY_CARE_PROVIDER_SITE_OTHER): Payer: Medicare Other | Admitting: Internal Medicine

## 2013-11-26 VITALS — BP 132/68 | HR 87 | Temp 98.0°F | Wt 195.0 lb

## 2013-11-26 DIAGNOSIS — Z5181 Encounter for therapeutic drug level monitoring: Secondary | ICD-10-CM

## 2013-11-26 DIAGNOSIS — M79609 Pain in unspecified limb: Secondary | ICD-10-CM

## 2013-11-26 DIAGNOSIS — L03119 Cellulitis of unspecified part of limb: Secondary | ICD-10-CM

## 2013-11-26 DIAGNOSIS — L02619 Cutaneous abscess of unspecified foot: Secondary | ICD-10-CM

## 2013-11-26 DIAGNOSIS — L03116 Cellulitis of left lower limb: Secondary | ICD-10-CM

## 2013-11-26 DIAGNOSIS — M79672 Pain in left foot: Secondary | ICD-10-CM

## 2013-11-26 LAB — POCT INR: INR: 2.1

## 2013-11-26 MED ORDER — HYDROCODONE-ACETAMINOPHEN 5-325 MG PO TABS: 1.0000 | ORAL_TABLET | Freq: Four times a day (QID) | ORAL | Status: DC | PRN

## 2013-11-26 MED ORDER — CEFTRIAXONE SODIUM 1 G IJ SOLR
1.0000 g | Freq: Once | INTRAMUSCULAR | Status: AC
  Administered 2013-11-26: 1 g via INTRAMUSCULAR

## 2013-11-26 NOTE — Progress Notes (Signed)
Pre visit review using our clinic review tool, if applicable. No additional management support is needed unless otherwise documented below in the visit note. 

## 2013-11-26 NOTE — Patient Instructions (Addendum)

## 2013-11-26 NOTE — Addendum Note (Signed)
Addended by: Lurlean Nanny on: 11/26/2013 11:33 AM   Modules accepted: Orders

## 2013-11-26 NOTE — Progress Notes (Signed)
Subjective:    Patient ID: Shawn Grace., male    DOB: 24-Aug-1931, 80 y.o.   MRN: [id number]  HPI  Pt presents to the clinic today for recheck of left foot. He was diagnosed with cellultis of the left foot 11/22/13. He was given 1 gm Rocephin IM and started on Augmentin. (prior to this he had been on keflex for 5 days). He returned to the clinic 11/23/13 and felt like the foot was worse. He was given another 1 gm Rocephin IM and started on Levaquin in addition to the Augmentin. Since that time, he reports that he has been experiencing pain in his foot. Sometimes the pain is sharp and shooting. Other times it is a feeling of tightness. He has noticed some improvement in the redness. He denies fever, chills or body aches.  Review of Systems      Past Medical History  Diagnosis Date  . MI   . MITRAL REGURGITATION   . HYPERTENSION   . HYPERLIPIDEMIA   . CVA   . CAD   . Atrial fibrillation   . Edema   . DM   . Stroke 1999  . History of kidney stones   . Neuropathy of both feet   . CHF (congestive heart failure)   . Kidney stone     Current Outpatient Prescriptions  Medication Sig Dispense Refill  . acetaminophen (TYLENOL) 650 MG CR tablet Take 650 mg by mouth every 8 (eight) hours as needed.        Marland Kitchen albuterol (2.5 MG/3ML) 0.083% NEBU 3 mL, albuterol (5 MG/ML) 0.5% NEBU 0.5 mL Inhale 1 mg into the lungs.      Marland Kitchen albuterol (PROVENTIL HFA;VENTOLIN HFA) 108 (90 BASE) MCG/ACT inhaler Inhale 2 puffs into the lungs every 6 (six) hours as needed.        . ALPRAZolam (XANAX) 0.25 MG tablet Take 1 tablet (0.25 mg total) by mouth at bedtime as needed for anxiety.  30 tablet  0  . amoxicillin-clavulanate (AUGMENTIN) 875-125 MG per tablet Take 1 tablet by mouth 2 (two) times daily.  20 tablet  0  . aspirin 81 MG tablet Take 81 mg by mouth daily.        Marland Kitchen atorvastatin (LIPITOR) 40 MG tablet Take 40 mg by mouth daily.        . bimatoprost (LUMIGAN) 0.03 % ophthalmic solution Place 1 drop  into both eyes at bedtime.       . budesonide-formoterol (SYMBICORT) 160-4.5 MCG/ACT inhaler Inhale 2 puffs into the lungs 2 (two) times daily.      . citalopram (CELEXA) 10 MG tablet Take 10 mg by mouth daily.        . furosemide (LASIX) 20 MG tablet Take 20 mg by mouth daily.        Marland Kitchen glimepiride (AMARYL) 4 MG tablet Take 4 mg in the am with 8 mg in the pm.      . isosorbide dinitrate (ISORDIL) 40 MG tablet Take 40 mg by mouth 3 (three) times daily.        Marland Kitchen levofloxacin (LEVAQUIN) 500 MG tablet Take 1 tablet (500 mg total) by mouth daily.  10 tablet  0  . lisinopril (PRINIVIL,ZESTRIL) 40 MG tablet Take 40 mg by mouth daily.        . metFORMIN (GLUCOPHAGE) 1000 MG tablet Take 1,000 mg by mouth 2 (two) times daily with a meal.        . metoprolol (TOPROL-XL) 50 MG  24 hr tablet Take 50 mg by mouth 2 (two) times daily.       . mometasone (NASONEX) 50 MCG/ACT nasal spray Place 2 sprays into the nose daily.        . nitroGLYCERIN (NITROSTAT) 0.4 MG SL tablet Place 0.4 mg under the tongue every 5 (five) minutes as needed.        . potassium chloride (KLOR-CON) 10 MEQ CR tablet 2 tablets one day 3 tablets the next day      . predniSONE (DELTASONE) 10 MG tablet Take 10 mg by mouth daily with breakfast.      . tiotropium (SPIRIVA) 18 MCG inhalation capsule Place 18 mcg into inhaler and inhale daily.        Marland Kitchen warfarin (COUMADIN) 2 MG tablet Take 4 mg by mouth as directed. 2 tablets       No current facility-administered medications for this visit.    Allergies  Allergen Reactions  . Morphine And Related Other (See Comments)    Hallucinations   . Niacin And Related Dermatitis    Family History  Problem Relation Age of Onset  . Heart disease Maternal Grandmother   . Diabetes Maternal Grandmother   . Cancer Neg Hx   . Stroke Neg Hx   . Heart disease Mother     History   Social History  . Marital Status: Married    Spouse Name: N/A    Number of Children: N/A  . Years of Education: N/A     Occupational History  . Not on file.   Social History Main Topics  . Smoking status: Former Smoker -- 2.00 packs/day for 40 years    Types: Cigarettes  . Smokeless tobacco: Former Systems developer    Quit date: 05/24/1990  . Alcohol Use: No  . Drug Use: No  . Sexual Activity: Not Currently   Other Topics Concern  . Not on file   Social History Narrative  . No narrative on file     Constitutional: Denies fever, malaise, fatigue, headache or abrupt weight changes.  Musculoskeletal: Pt reports pain in left foot. Denies muscle pain or joint swelling.  Skin: Pt reports redness of left foot. Denies rashes, lesions or ulcercations.    No other specific complaints in a complete review of systems (except as listed in HPI above).  Objective:   Physical Exam   BP 132/68  Pulse 87  Temp(Src) 98 F (36.7 C) (Oral)  Wt 195 lb (88.451 kg)  SpO2 96% Wt Readings from Last 3 Encounters:  11/26/13 195 lb (88.451 kg)  11/23/13 190 lb (86.183 kg)  11/22/13 193 lb (87.544 kg)    General: Appears his stated age, well developed, well nourished in NAD. Skin: Area of deep redness with cellulitis on dorsal surface of foot has decreased. Still erythematous and tender to touch. Musculoskeletal:  2+ pitting edema noted of LLE. Pain with palpation of the dorsal surface of the foot. Decreased dorsiflexion. Normal plantar flexion. Using rollator for assistance with gait.   BMET    Component Value Date/Time   NA 137 06/22/2013 1416   K 4.3 06/22/2013 1416   CL 104 06/22/2013 1416   CO2 27 06/22/2013 1416   GLUCOSE 150* 06/22/2013 1416   BUN 15 06/22/2013 1416   CREATININE 1.1 06/22/2013 1416   CALCIUM 9.2 06/22/2013 1416    Lipid Panel     Component Value Date/Time   CHOL 109 06/22/2013 1416   TRIG 192.0* 06/22/2013 1416   HDL 29.80*  06/22/2013 1416   CHOLHDL 4 06/22/2013 1416   VLDL 38.4 06/22/2013 1416   LDLCALC 41 06/22/2013 1416    CBC    Component Value Date/Time   WBC 12.2* 06/22/2013 1416    RBC 4.50 06/22/2013 1416   HGB 12.5* 06/22/2013 1416   HCT 38.1* 06/22/2013 1416   PLT 334.0 06/22/2013 1416   MCV 84.6 06/22/2013 1416   MCHC 32.9 06/22/2013 1416   RDW 17.6* 06/22/2013 1416    Hgb A1C Lab Results  Component Value Date   HGBA1C 7.9* 06/22/2013        Assessment & Plan:   Cellulitis of left foot with pain:  Slight improvement noted Continue Augmentin and Levaquin Rocephin 1 gm IM today Keep leg elevated RX for Norco given for pain relief  RTC on Thursday to recheck foot before the weekend

## 2013-11-29 ENCOUNTER — Encounter: Payer: Self-pay | Admitting: Internal Medicine

## 2013-11-29 ENCOUNTER — Ambulatory Visit (INDEPENDENT_AMBULATORY_CARE_PROVIDER_SITE_OTHER): Payer: Medicare Other | Admitting: Internal Medicine

## 2013-11-29 VITALS — BP 128/76 | HR 68 | Temp 97.7°F | Wt 195.0 lb

## 2013-11-29 DIAGNOSIS — L02619 Cutaneous abscess of unspecified foot: Secondary | ICD-10-CM

## 2013-11-29 DIAGNOSIS — L03119 Cellulitis of unspecified part of limb: Secondary | ICD-10-CM

## 2013-11-29 DIAGNOSIS — L03116 Cellulitis of left lower limb: Secondary | ICD-10-CM

## 2013-11-29 MED ORDER — AMOXICILLIN-POT CLAVULANATE 875-125 MG PO TABS: 1.0000 | ORAL_TABLET | Freq: Two times a day (BID) | ORAL | Status: DC

## 2013-11-29 MED ORDER — LEVOFLOXACIN 500 MG PO TABS: 500.0000 mg | ORAL_TABLET | Freq: Every day | ORAL | Status: DC

## 2013-11-29 NOTE — Progress Notes (Signed)
Pre visit review using our clinic review tool, if applicable. No additional management support is needed unless otherwise documented below in the visit note. 

## 2013-11-29 NOTE — Patient Instructions (Addendum)

## 2013-11-29 NOTE — Progress Notes (Signed)
Subjective:    Patient ID: [name]., male    DOB: 1931-10-03, 80 y.o.   MRN: [id number]  HPI  Pt presents to the clinic today to recheck cellulitis of left foot.  11/22/13- Given 1 gm Rocephin and started on Augmentin (had been on Keflex for 5 days prior to that) 11/23/13: Swelling and pain worse. Given another 1 gm of Rocephin and started on Levaquin in addition to Augmentin. 11/26/13: Some improvement in swelling but worsening pain. Repeated 1 gm Rocephin, advised to continue Augmentin and Levaquin. Norco for pain relief.  Today, he reports the pain, swelling and redness have improved.  Review of Systems      Past Medical History  Diagnosis Date  . MI   . MITRAL REGURGITATION   . HYPERTENSION   . HYPERLIPIDEMIA   . CVA   . CAD   . Atrial fibrillation   . Edema   . DM   . Stroke 1999  . History of kidney stones   . Neuropathy of both feet   . CHF (congestive heart failure)   . Kidney stone     Current Outpatient Prescriptions  Medication Sig Dispense Refill  . acetaminophen (TYLENOL) 650 MG CR tablet Take 650 mg by mouth every 8 (eight) hours as needed.        Marland Kitchen albuterol (2.5 MG/3ML) 0.083% NEBU 3 mL, albuterol (5 MG/ML) 0.5% NEBU 0.5 mL Inhale 1 mg into the lungs.      Marland Kitchen albuterol (PROVENTIL HFA;VENTOLIN HFA) 108 (90 BASE) MCG/ACT inhaler Inhale 2 puffs into the lungs every 6 (six) hours as needed.        . ALPRAZolam (XANAX) 0.25 MG tablet Take 1 tablet (0.25 mg total) by mouth at bedtime as needed for anxiety.  30 tablet  0  . amoxicillin-clavulanate (AUGMENTIN) 875-125 MG per tablet Take 1 tablet by mouth 2 (two) times daily.  20 tablet  0  . aspirin 81 MG tablet Take 81 mg by mouth daily.        Marland Kitchen atorvastatin (LIPITOR) 40 MG tablet Take 40 mg by mouth daily.        . bimatoprost (LUMIGAN) 0.03 % ophthalmic solution Place 1 drop into both eyes at bedtime.       . budesonide-formoterol (SYMBICORT) 160-4.5 MCG/ACT inhaler Inhale 2 puffs into the lungs 2  (two) times daily.      . citalopram (CELEXA) 10 MG tablet Take 10 mg by mouth daily.        . furosemide (LASIX) 20 MG tablet Take 20 mg by mouth daily.        Marland Kitchen glimepiride (AMARYL) 4 MG tablet Take 4 mg in the am with 8 mg in the pm.      . HYDROcodone-acetaminophen (NORCO/VICODIN) 5-325 MG per tablet Take 1 tablet by mouth every 6 (six) hours as needed for moderate pain.  60 tablet  0  . isosorbide dinitrate (ISORDIL) 40 MG tablet Take 40 mg by mouth 3 (three) times daily.        Marland Kitchen levofloxacin (LEVAQUIN) 500 MG tablet Take 1 tablet (500 mg total) by mouth daily.  10 tablet  0  . lisinopril (PRINIVIL,ZESTRIL) 40 MG tablet Take 40 mg by mouth daily.        . metFORMIN (GLUCOPHAGE) 1000 MG tablet Take 1,000 mg by mouth 2 (two) times daily with a meal.        . metoprolol (TOPROL-XL) 50 MG 24 hr tablet Take 50 mg  by mouth 2 (two) times daily.       . mometasone (NASONEX) 50 MCG/ACT nasal spray Place 2 sprays into the nose daily.        . nitroGLYCERIN (NITROSTAT) 0.4 MG SL tablet Place 0.4 mg under the tongue every 5 (five) minutes as needed.        . potassium chloride (KLOR-CON) 10 MEQ CR tablet 2 tablets one day 3 tablets the next day      . predniSONE (DELTASONE) 10 MG tablet Take 10 mg by mouth daily with breakfast.      . tiotropium (SPIRIVA) 18 MCG inhalation capsule Place 18 mcg into inhaler and inhale daily.        Marland Kitchen warfarin (COUMADIN) 2 MG tablet Take 4 mg by mouth as directed. 2 tablets       No current facility-administered medications for this visit.    Allergies  Allergen Reactions  . Morphine And Related Other (See Comments)    Hallucinations   . Niacin And Related Dermatitis    Family History  Problem Relation Age of Onset  . Heart disease Maternal Grandmother   . Diabetes Maternal Grandmother   . Cancer Neg Hx   . Stroke Neg Hx   . Heart disease Mother     History   Social History  . Marital Status: Married    Spouse Name: N/A    Number of Children: N/A    . Years of Education: N/A   Occupational History  . Not on file.   Social History Main Topics  . Smoking status: Former Smoker -- 2.00 packs/day for 40 years    Types: Cigarettes  . Smokeless tobacco: Former Systems developer    Quit date: 05/24/1990  . Alcohol Use: No  . Drug Use: No  . Sexual Activity: Not Currently   Other Topics Concern  . Not on file   Social History Narrative  . No narrative on file     Constitutional: Denies fever, malaise, fatigue, headache or abrupt weight changes.  Musculoskeletal: Denies decrease in range of motion muscle pain.  Skin: Pt reports redness of left foot. Denies rashes, lesions or ulcercations.    No other specific complaints in a complete review of systems (except as listed in HPI above).  Objective:   Physical Exam  BP 128/76  Pulse 68  Temp(Src) 97.7 F (36.5 C) (Oral)  Wt 195 lb (88.451 kg)  SpO2 98% Wt Readings from Last 3 Encounters:  11/29/13 195 lb (88.451 kg)  11/26/13 195 lb (88.451 kg)  11/23/13 190 lb (86.183 kg)    General: Appears his stated age, obese well developed, well nourished in NAD. Skin: Improving cellulitis noted on dorsal surface of left foot. Musculoskeletal: Normal flexion and extension of the left ankle. 2+ pitting edema noted.  Using walker forassistance with gait.    BMET    Component Value Date/Time   NA 137 06/22/2013 1416   K 4.3 06/22/2013 1416   CL 104 06/22/2013 1416   CO2 27 06/22/2013 1416   GLUCOSE 150* 06/22/2013 1416   BUN 15 06/22/2013 1416   CREATININE 1.1 06/22/2013 1416   CALCIUM 9.2 06/22/2013 1416    Lipid Panel     Component Value Date/Time   CHOL 109 06/22/2013 1416   TRIG 192.0* 06/22/2013 1416   HDL 29.80* 06/22/2013 1416   CHOLHDL 4 06/22/2013 1416   VLDL 38.4 06/22/2013 1416   LDLCALC 41 06/22/2013 1416    CBC    Component Value  Date/Time   WBC 12.2* 06/22/2013 1416   RBC 4.50 06/22/2013 1416   HGB 12.5* 06/22/2013 1416   HCT 38.1* 06/22/2013 1416   PLT 334.0 06/22/2013 1416    MCV 84.6 06/22/2013 1416   MCHC 32.9 06/22/2013 1416   RDW 17.6* 06/22/2013 1416    Hgb A1C Lab Results  Component Value Date   HGBA1C 7.9* 06/22/2013         Assessment & Plan:   Cellulitis of left foot, improved:  He reports that some how he ran out of his abx early (he should of had enough until Monday) Because there is still a moderate amount of cellulitis, will repeat Augmentin and Levaquin x 10 more days Continue to watch for increased redness, pain or warmth  RTC as needed

## 2013-12-11 ENCOUNTER — Encounter: Payer: Self-pay | Admitting: Internal Medicine

## 2013-12-11 ENCOUNTER — Ambulatory Visit (INDEPENDENT_AMBULATORY_CARE_PROVIDER_SITE_OTHER)
Admission: RE | Admit: 2013-12-11 | Discharge: 2013-12-11 | Disposition: A | Discharge status: Home / Self Care | Payer: Medicare Other | Source: Ambulatory Visit | Attending: Internal Medicine | Admitting: Internal Medicine

## 2013-12-11 ENCOUNTER — Ambulatory Visit (INDEPENDENT_AMBULATORY_CARE_PROVIDER_SITE_OTHER): Payer: Medicare Other | Admitting: Internal Medicine

## 2013-12-11 VITALS — BP 136/64 | HR 83 | Temp 97.9°F | Wt 199.8 lb

## 2013-12-11 DIAGNOSIS — L03116 Cellulitis of left lower limb: Secondary | ICD-10-CM

## 2013-12-11 DIAGNOSIS — R609 Edema, unspecified: Secondary | ICD-10-CM

## 2013-12-11 DIAGNOSIS — L03119 Cellulitis of unspecified part of limb: Secondary | ICD-10-CM

## 2013-12-11 DIAGNOSIS — L02619 Cutaneous abscess of unspecified foot: Secondary | ICD-10-CM

## 2013-12-11 DIAGNOSIS — N400 Enlarged prostate without lower urinary tract symptoms: Secondary | ICD-10-CM

## 2013-12-11 DIAGNOSIS — I509 Heart failure, unspecified: Secondary | ICD-10-CM

## 2013-12-11 MED ORDER — TAMSULOSIN HCL 0.4 MG PO CAPS: 0.4000 mg | ORAL_CAPSULE | Freq: Every day | ORAL | Status: DC

## 2013-12-11 MED ORDER — FUROSEMIDE 20 MG PO TABS: 20.0000 mg | ORAL_TABLET | Freq: Every day | ORAL | Status: DC

## 2013-12-11 NOTE — Progress Notes (Signed)
Pre visit review using our clinic review tool, if applicable. No additional management support is needed unless otherwise documented below in the visit note. 

## 2013-12-11 NOTE — Progress Notes (Signed)
Subjective:    Patient ID: [name]., male    DOB: January 12, 1932, 80 y.o.   MRN: [id number]  HPI  Pt presents to the clinic today with c/o redness and swelling of his right foot. This started 3 days ago. He noticed this after he bumped his foot on something. He has noted fluid drainage from the wound. He has also noticed discoloration of the right foot and ankle. He did speak with his cardiologist yesterday who advised him to take 1.5 of his fluid pills. He has not noticed a difference in the swelling in his right foot. He denies pain in that foot.  Additionally, he continues to be concerned about the left foot. He has 2 days left of his antibiotics. There has been no improvement in the redness of that foot.  He also has concerns today about voiding issues. He has difficulty getting his stream started. He also gets up multiple times per night to urinate.  Review of Systems      Past Medical History  Diagnosis Date  . MI   . MITRAL REGURGITATION   . HYPERTENSION   . HYPERLIPIDEMIA   . CVA   . CAD   . Atrial fibrillation   . Edema   . DM   . Stroke 1999  . History of kidney stones   . Neuropathy of both feet   . CHF (congestive heart failure)   . Kidney stone     Current Outpatient Prescriptions  Medication Sig Dispense Refill  . acetaminophen (TYLENOL) 650 MG CR tablet Take 650 mg by mouth every 8 (eight) hours as needed.        Marland Kitchen albuterol (2.5 MG/3ML) 0.083% NEBU 3 mL, albuterol (5 MG/ML) 0.5% NEBU 0.5 mL Inhale 1 mg into the lungs.      Marland Kitchen albuterol (PROVENTIL HFA;VENTOLIN HFA) 108 (90 BASE) MCG/ACT inhaler Inhale 2 puffs into the lungs every 6 (six) hours as needed.        . ALPRAZolam (XANAX) 0.25 MG tablet Take 1 tablet (0.25 mg total) by mouth at bedtime as needed for anxiety.  30 tablet  0  . amoxicillin-clavulanate (AUGMENTIN) 875-125 MG per tablet Take 1 tablet by mouth 2 (two) times daily.  20 tablet  0  . aspirin 81 MG tablet Take 81 mg by mouth daily.         Marland Kitchen atorvastatin (LIPITOR) 40 MG tablet Take 40 mg by mouth daily.        . bimatoprost (LUMIGAN) 0.03 % ophthalmic solution Place 1 drop into both eyes at bedtime.       . budesonide-formoterol (SYMBICORT) 160-4.5 MCG/ACT inhaler Inhale 2 puffs into the lungs 2 (two) times daily.      . citalopram (CELEXA) 10 MG tablet Take 10 mg by mouth daily.        . furosemide (LASIX) 20 MG tablet Take 20 mg by mouth daily.        Marland Kitchen glimepiride (AMARYL) 4 MG tablet Take 4 mg in the am with 8 mg in the pm.      . HYDROcodone-acetaminophen (NORCO/VICODIN) 5-325 MG per tablet Take 1 tablet by mouth every 6 (six) hours as needed for moderate pain.  60 tablet  0  . isosorbide dinitrate (ISORDIL) 40 MG tablet Take 40 mg by mouth 3 (three) times daily.        Marland Kitchen levofloxacin (LEVAQUIN) 500 MG tablet Take 1 tablet (500 mg total) by mouth daily.  10 tablet  0  . lisinopril (PRINIVIL,ZESTRIL) 40 MG tablet Take 40 mg by mouth daily.        . metFORMIN (GLUCOPHAGE) 1000 MG tablet Take 1,000 mg by mouth 2 (two) times daily with a meal.        . metoprolol (TOPROL-XL) 50 MG 24 hr tablet Take 50 mg by mouth 2 (two) times daily.       . mometasone (NASONEX) 50 MCG/ACT nasal spray Place 2 sprays into the nose daily.        . nitroGLYCERIN (NITROSTAT) 0.4 MG SL tablet Place 0.4 mg under the tongue every 5 (five) minutes as needed.        . potassium chloride (KLOR-CON) 10 MEQ CR tablet 2 tablets one day 3 tablets the next day      . predniSONE (DELTASONE) 10 MG tablet Take 10 mg by mouth daily with breakfast.      . tiotropium (SPIRIVA) 18 MCG inhalation capsule Place 18 mcg into inhaler and inhale daily.        Marland Kitchen warfarin (COUMADIN) 2 MG tablet Take 4 mg by mouth as directed. 2 tablets       No current facility-administered medications for this visit.    Allergies  Allergen Reactions  . Morphine And Related Other (See Comments)    Hallucinations   . Niacin And Related Dermatitis    Family History  Problem  Relation Age of Onset  . Heart disease Maternal Grandmother   . Diabetes Maternal Grandmother   . Cancer Neg Hx   . Stroke Neg Hx   . Heart disease Mother     History   Social History  . Marital Status: Married    Spouse Name: N/A    Number of Children: N/A  . Years of Education: N/A   Occupational History  . Not on file.   Social History Main Topics  . Smoking status: Former Smoker -- 2.00 packs/day for 40 years    Types: Cigarettes  . Smokeless tobacco: Former Systems developer    Quit date: 05/24/1990  . Alcohol Use: No  . Drug Use: No  . Sexual Activity: Not Currently   Other Topics Concern  . Not on file   Social History Narrative  . No narrative on file     Constitutional: Pt reports fatigue. Denies fever, malaise, headache or abrupt weight changes.  Respiratory: Pt reports shortness of breath. Denies difficulty breathing, cough or sputum production.   Cardiovascular: Denies chest pain, chest tightness, palpitations or swelling in the hands or feet.  Musculoskeletal: Pt reports swelling of right foot. Denies decrease in range of motion, muscle pain or joint pain.  Skin: Pt reports redness of left foot. Denies rashes, lesions or ulcercations.    No other specific complaints in a complete review of systems (except as listed in HPI above).  Objective:   Physical Exam  BP 136/64  Pulse 83  Temp(Src) 97.9 F (36.6 C) (Oral)  Wt 199 lb 12 oz (90.606 kg)  SpO2 97% Wt Readings from Last 3 Encounters:  12/11/13 199 lb 12 oz (90.606 kg)  11/29/13 195 lb (88.451 kg)  11/26/13 195 lb (88.451 kg)    General: Appears his stated age, chronically ill appearing in NAD. Skin: Left foot remains red and warm to touch. Swelling has improved. Cardiovascular: Normal rate and rhythm. S1,S2 noted.  No murmur, rubs or gallops noted. 3+ pitting edema BLE Pulmonary/Chest: Normal effort and fine crackles noted in the bases. No respiratory distress. No wheezes,  or ronchi noted.    Musculoskeletal: Using rolling walker for assistance with gait.    BMET    Component Value Date/Time   NA 137 06/22/2013 1416   K 4.3 06/22/2013 1416   CL 104 06/22/2013 1416   CO2 27 06/22/2013 1416   GLUCOSE 150* 06/22/2013 1416   BUN 15 06/22/2013 1416   CREATININE 1.1 06/22/2013 1416   CALCIUM 9.2 06/22/2013 1416    Lipid Panel     Component Value Date/Time   CHOL 109 06/22/2013 1416   TRIG 192.0* 06/22/2013 1416   HDL 29.80* 06/22/2013 1416   CHOLHDL 4 06/22/2013 1416   VLDL 38.4 06/22/2013 1416   LDLCALC 41 06/22/2013 1416    CBC    Component Value Date/Time   WBC 12.2* 06/22/2013 1416   RBC 4.50 06/22/2013 1416   HGB 12.5* 06/22/2013 1416   HCT 38.1* 06/22/2013 1416   PLT 334.0 06/22/2013 1416   MCV 84.6 06/22/2013 1416   MCHC 32.9 06/22/2013 1416   RDW 17.6* 06/22/2013 1416    Hgb A1C Lab Results  Component Value Date   HGBA1C 7.9* 06/22/2013         Assessment & Plan:   Increased edema with crackles r/t CHF:  Increase lasix to 20 mg BID Will recheck CMET at his visit on 12/20/13  BPH:   Start Flomax 0.4 mg daily  Left foot cellulitis:  Check plain films of left foot to r/o osteomyelitis

## 2013-12-11 NOTE — Patient Instructions (Signed)

## 2013-12-12 ENCOUNTER — Other Ambulatory Visit: Payer: Self-pay | Admitting: Internal Medicine

## 2013-12-12 DIAGNOSIS — L03116 Cellulitis of left lower limb: Secondary | ICD-10-CM

## 2013-12-12 NOTE — Progress Notes (Signed)
Patient is aware as instructed

## 2013-12-13 ENCOUNTER — Ambulatory Visit: Payer: Medicare Other

## 2013-12-13 ENCOUNTER — Telehealth: Payer: Self-pay

## 2013-12-13 NOTE — Telephone Encounter (Signed)
Pt has appt with ID on 12/18/13 and made f/u appt with Regina B for 12/18/13 after ID appt

## 2013-12-13 NOTE — Telephone Encounter (Signed)
[name] left v/m; pt has appt with [facility] in [place] 12/18/13. Pt is out of antibiotic;nurse at infectious [facility] said for [name] to contact Shawn Silversmith NP for an antibiotic to be sent to [facility]. Pt fell on 12/12/13 and hand is swollen; Shawn Morera wants to know if pt has to be seen or what to do about hand. Shawn Davidow request cb.

## 2013-12-13 NOTE — Telephone Encounter (Signed)
Mrs Geisen left v/m requesting status of antibiotic sent to pharmacy. Mrs Viernes request cb.

## 2013-12-13 NOTE — Telephone Encounter (Signed)
He needs to be seen for hand injury and swelling

## 2013-12-14 ENCOUNTER — Encounter: Payer: Self-pay | Admitting: Family Medicine

## 2013-12-14 ENCOUNTER — Ambulatory Visit (INDEPENDENT_AMBULATORY_CARE_PROVIDER_SITE_OTHER): Payer: Medicare Other | Admitting: Family Medicine

## 2013-12-14 ENCOUNTER — Encounter: Payer: Self-pay | Admitting: *Deleted

## 2013-12-14 VITALS — BP 120/70 | HR 78 | Temp 97.6°F | Ht 66.0 in | Wt 199.2 lb

## 2013-12-14 DIAGNOSIS — I4891 Unspecified atrial fibrillation: Secondary | ICD-10-CM

## 2013-12-14 DIAGNOSIS — S41109A Unspecified open wound of unspecified upper arm, initial encounter: Secondary | ICD-10-CM

## 2013-12-14 DIAGNOSIS — S41119A Laceration without foreign body of unspecified upper arm, initial encounter: Secondary | ICD-10-CM

## 2013-12-14 DIAGNOSIS — L03119 Cellulitis of unspecified part of limb: Secondary | ICD-10-CM

## 2013-12-14 DIAGNOSIS — L02619 Cutaneous abscess of unspecified foot: Secondary | ICD-10-CM

## 2013-12-14 DIAGNOSIS — R609 Edema, unspecified: Secondary | ICD-10-CM | POA: Insufficient documentation

## 2013-12-14 DIAGNOSIS — L03116 Cellulitis of left lower limb: Secondary | ICD-10-CM

## 2013-12-14 LAB — BASIC METABOLIC PANEL
BUN: 21 mg/dL (ref 6–23)
CALCIUM: 9.1 mg/dL (ref 8.4–10.5)
CO2: 28 meq/L (ref 19–32)
Chloride: 104 mEq/L (ref 96–112)
Creatinine, Ser: 1.2 mg/dL (ref 0.4–1.5)
GFR: 61.92 mL/min (ref >60.00)
Glucose, Bld: 201 mg/dL — ABNORMAL HIGH (ref 70–99)
Potassium: 4.2 mEq/L (ref 3.5–5.1)
SODIUM: 138 meq/L (ref 135–145)

## 2013-12-14 LAB — PROTIME-INR
INR: 1.7 ratio — ABNORMAL HIGH (ref 0.8–1.0)
PROTHROMBIN TIME: 18.6 s — AB (ref 9.6–13.1)

## 2013-12-14 MED ORDER — LEVOFLOXACIN 500 MG PO TABS: 500.0000 mg | ORAL_TABLET | Freq: Every day | ORAL | Status: DC

## 2013-12-14 NOTE — Patient Instructions (Addendum)
Cancel appt for 9/8 and keep appt for 9/10. We will extend the antibitoics course until the appt with ID.  Stop at lab on way out for INR and BMET.  Increase legs above heart as able. Continue lasix 40 mg daily for now. Call us if swelling is not continuing to improve in legs as well as you right hand. Also elevate right hand above heart on pillows.

## 2013-12-14 NOTE — Progress Notes (Signed)
Pre visit review using our clinic review tool, if applicable. No additional management support is needed unless otherwise documented below in the visit note. 

## 2013-12-14 NOTE — Progress Notes (Signed)
   Subjective:    Patient ID: Shawn Grace., male    DOB: 11-Jun-1931, 80 y.o.   MRN: [id number]  HPI 80 year old male pt of [place] with history of CVA and CAD, afib presents following a fall 3 days ago. He was leaning over, lost balanace. He does have a history of frequent falls. He has inner ear issues and this caused his fall. Fell on right hand, elbow and knees. No head injury. No LOC. Feels well otherwise.Marland Kitchen No proceeding chest pain and SOB.  He has no pain in wrist or hand. Wife banaged right arms ( from previous fall)   He noted swelling in right hand when he awoke yesterday   Recent new medication flomax .  He is currently under treatment for Has appt on 9/8 with ID.  X-ray was negative.  S/P augmentin and 2 course levaquin has completed now.  Last INR 2.8  Has peripheral edema. Using lasix 40 mg daily ( was increased on 9/1) He has been urinating more. Wt Readings from Last 3 Encounters:  12/14/13 199 lb 4 oz (90.379 kg)  12/11/13 199 lb 12 oz (90.606 kg)  11/29/13 195 lb (88.451 kg)      Review of Systems  Constitutional: Negative for fever and fatigue.  HENT: Negative for ear pain.   Eyes: Negative for pain.  Respiratory: Negative for shortness of breath.   Cardiovascular: Negative for chest pain.       Objective:   Physical Exam  Constitutional: Vital signs are normal. He appears well-developed and well-nourished.  HENT:  Head: Normocephalic.  Right Ear: Hearing normal.  Left Ear: Hearing normal.  Nose: Nose normal.  Mouth/Throat: Oropharynx is clear and moist and mucous membranes are normal.  Neck: Trachea normal. Carotid bruit is not present. No mass and no thyromegaly present.  Cardiovascular: Normal rate, regular rhythm and normal pulses.  Exam reveals no gallop, no distant heart sounds and no friction rub.   No murmur heard. No peripheral edema  Pulmonary/Chest: Effort normal and breath sounds normal. No respiratory distress.  Skin: Skin  is warm and dry. Abrasion and ecchymosis noted. No rash noted.  Psychiatric: He has a normal mood and affect. His speech is normal and behavior is normal. Thought content normal.          Assessment & Plan:

## 2013-12-18 ENCOUNTER — Encounter: Payer: Self-pay | Admitting: Internal Medicine

## 2013-12-18 ENCOUNTER — Ambulatory Visit: Payer: Medicare Other | Admitting: Internal Medicine

## 2013-12-18 ENCOUNTER — Ambulatory Visit (INDEPENDENT_AMBULATORY_CARE_PROVIDER_SITE_OTHER): Payer: Medicare Other | Admitting: Internal Medicine

## 2013-12-18 VITALS — BP 151/77 | HR 84 | Temp 97.6°F | Ht 66.0 in | Wt 201.0 lb

## 2013-12-18 DIAGNOSIS — L03116 Cellulitis of left lower limb: Secondary | ICD-10-CM

## 2013-12-18 DIAGNOSIS — L03119 Cellulitis of unspecified part of limb: Secondary | ICD-10-CM

## 2013-12-18 DIAGNOSIS — L02619 Cutaneous abscess of unspecified foot: Secondary | ICD-10-CM

## 2013-12-18 DIAGNOSIS — Z23 Encounter for immunization: Secondary | ICD-10-CM

## 2013-12-18 MED ORDER — CEPHALEXIN 500 MG PO CAPS: 500.0000 mg | ORAL_CAPSULE | Freq: Four times a day (QID) | ORAL | Status: DC

## 2013-12-18 NOTE — Assessment & Plan Note (Signed)
At this point, his foot looks fine with no signs of any infection.  With previous concern 4 days ago, I will have him continue with keflex for 2 more days and stop.  I have told him not to take the levaquin as it is not needed.   For any future episodes of cellulitis, should use keflex for 5 days.    RTC prn

## 2013-12-18 NOTE — Progress Notes (Signed)
   Subjective:    Patient ID: [name]., male    DOB: Jun 09, 1931, 80 y.o.   MRN: [id number]  HPI Here for evaluation of cellulitis.  Initially had a fall and cellulitis developed on left foot, was warm, indurated.  Given Keflex for apparent prophylaxis and then seen by PCP who started augmentin for 10 days after a shot of Rocephin.  Added levaquin (?broader coverage).  Received another shot of Rocephin 3 days later.  On 8/20 started again on Augmentin and levaquin for 10 days with return of cellulitis.  Still with apparent cellulitis 9/1 and xray done negative for osteomyelitis.  Seen again 9/4 and concern for cellulitis and started back on levaquin monotherapy.  No fever no chills.  Has bilateral foot swelling.  Hurt his right wrist.     Review of Systems  Constitutional: Negative for fever and fatigue.  Cardiovascular: Positive for leg swelling.  Gastrointestinal: Negative for nausea and diarrhea.  Skin: Negative for rash.  Neurological: Negative for dizziness and light-headedness.       Objective:   Physical Exam  Constitutional: He appears well-developed and well-nourished. No distress.  HENT:  Mouth/Throat: No oropharyngeal exudate.  Eyes: No scleral icterus.  Cardiovascular: Normal rate and normal heart sounds.   Musculoskeletal: He exhibits edema.  Skin: No rash noted.  Left foot with some redness over entire foot, no warmth, some darkening of toes, cool toes, no tenderness          Assessment & Plan:

## 2013-12-20 ENCOUNTER — Ambulatory Visit: Payer: Medicare Other | Admitting: Internal Medicine

## 2013-12-24 ENCOUNTER — Ambulatory Visit (INDEPENDENT_AMBULATORY_CARE_PROVIDER_SITE_OTHER): Payer: Medicare Other | Admitting: Family Medicine

## 2013-12-24 DIAGNOSIS — Z5181 Encounter for therapeutic drug level monitoring: Secondary | ICD-10-CM

## 2013-12-24 LAB — POCT INR: INR: 2.5

## 2013-12-31 ENCOUNTER — Ambulatory Visit: Payer: Medicare Other | Admitting: Internal Medicine

## 2014-01-02 ENCOUNTER — Encounter: Payer: Self-pay | Admitting: Internal Medicine

## 2014-01-04 ENCOUNTER — Other Ambulatory Visit: Payer: Self-pay

## 2014-01-04 ENCOUNTER — Telehealth: Payer: Self-pay | Admitting: Internal Medicine

## 2014-01-04 DIAGNOSIS — N400 Enlarged prostate without lower urinary tract symptoms: Secondary | ICD-10-CM

## 2014-01-04 MED ORDER — TAMSULOSIN HCL 0.4 MG PO CAPS: 0.4000 mg | ORAL_CAPSULE | Freq: Every day | ORAL | Status: DC

## 2014-01-04 NOTE — Telephone Encounter (Signed)
Last filled 12/11/13--please advise--pt is requesting #90 day supply sent to mail order--please advise

## 2014-01-04 NOTE — Telephone Encounter (Signed)
Pts wife came in requesting a 90 days supply of "Tamsulosin 0.4 mg capsules. Take 1 a day by mouth. 3 refills."   Pt now is using [facility] instead of [facility] bc of insurance requirements.

## 2014-01-04 NOTE — Telephone Encounter (Signed)
Refill request sent to [facility] in a refill note--please refer

## 2014-01-09 ENCOUNTER — Ambulatory Visit (INDEPENDENT_AMBULATORY_CARE_PROVIDER_SITE_OTHER): Payer: Medicare Other | Admitting: Family Medicine

## 2014-01-09 ENCOUNTER — Telehealth: Payer: Self-pay

## 2014-01-09 ENCOUNTER — Ambulatory Visit: Payer: Medicare Other | Admitting: Family Medicine

## 2014-01-09 ENCOUNTER — Encounter: Payer: Self-pay | Admitting: Family Medicine

## 2014-01-09 VITALS — BP 130/88 | HR 79 | Temp 97.4°F | Ht 66.0 in | Wt 203.2 lb

## 2014-01-09 DIAGNOSIS — S41109A Unspecified open wound of unspecified upper arm, initial encounter: Secondary | ICD-10-CM

## 2014-01-09 DIAGNOSIS — N390 Urinary tract infection, site not specified: Secondary | ICD-10-CM | POA: Insufficient documentation

## 2014-01-09 DIAGNOSIS — S41111A Laceration without foreign body of right upper arm, initial encounter: Secondary | ICD-10-CM

## 2014-01-09 DIAGNOSIS — R300 Dysuria: Secondary | ICD-10-CM

## 2014-01-09 DIAGNOSIS — R609 Edema, unspecified: Secondary | ICD-10-CM

## 2014-01-09 DIAGNOSIS — S41119A Laceration without foreign body of unspecified upper arm, initial encounter: Secondary | ICD-10-CM | POA: Insufficient documentation

## 2014-01-09 DIAGNOSIS — R600 Localized edema: Secondary | ICD-10-CM

## 2014-01-09 LAB — POCT URINALYSIS DIPSTICK
Bilirubin, UA: NEGATIVE
Glucose, UA: NEGATIVE
KETONES UA: NEGATIVE
Nitrite, UA: NEGATIVE
PH UA: 5.5
SPEC GRAV UA: 1.02
UROBILINOGEN UA: 0.2

## 2014-01-09 MED ORDER — CEPHALEXIN 250 MG PO CAPS: 250.0000 mg | ORAL_CAPSULE | Freq: Two times a day (BID) | ORAL | Status: DC

## 2014-01-09 NOTE — Progress Notes (Signed)
Subjective:    Patient ID: [name]., male    DOB: 29-May-1931, 80 y.o.   MRN: [id number]  HPI Here for ? uti and also a skin tear   He has hx of CHF - sees Dr. Rockey Situ  More swelling lately  Wt is up 2 lb   Has cellulitis in L leg - treated by Dr Linus Salmons - cellulitis is calm but swelling remains   Urinary symptoms: Burns to urinate  Also odor to urine  Frequency and urgency  Results for orders placed in visit on 01/09/14  POCT URINALYSIS DIPSTICK      Result Value Ref Range   Color, UA Yellow     Clarity, UA cloudy     Glucose, UA neg.     Bilirubin, UA neg.     Ketones, UA neg.     Spec Grav, UA 1.020     Blood, UA Moderate     pH, UA 5.5     Protein, UA Trace     Urobilinogen, UA 0.2     Nitrite, UA neg.     Leukocytes, UA large (3+)     he does get utis fairly frequently  Hx of kidney stones and stents   Has a skin tear- when wife grabbed his arm to keep him from falling  Lost balance going up stairs So they put 2 more safety rales up   Has taken levaquin and cephalexin in the past    Patient Active Problem List   Diagnosis Date Noted  . Skin tear of upper arm without complication 24/12/7351  . UTI (lower urinary tract infection) 01/09/2014  . Pedal edema 01/09/2014  . Peripheral edema 12/14/2013  . Cellulitis of left foot 11/23/2013  . Falls frequently 09/24/2013  . Insomnia 08/07/2013  . DM 02/26/2009  . HYPERLIPIDEMIA 02/26/2009  . MITRAL REGURGITATION 02/26/2009  . HYPERTENSION 02/26/2009  . MI 02/26/2009  . CAD 02/26/2009  . ATRIAL FIBRILLATION 02/26/2009  . CVA 02/26/2009   Past Medical History  Diagnosis Date  . MI   . MITRAL REGURGITATION   . HYPERTENSION   . HYPERLIPIDEMIA   . CVA   . CAD   . Atrial fibrillation   . Edema   . DM   . Stroke 1999  . History of kidney stones   . Neuropathy of both feet   . CHF (congestive heart failure)   . Kidney stone    Past Surgical History  Procedure Laterality Date  . Tonsillectomy  and adenoidectomy  1959  . Carotid stent insertion  1997  . Cardiac catheterization  1997    [facility]  . Coronary angioplasty  1997    s/p stent placement x 2   . Kidney surgery  05/2013    s/p stent placement   . Bladder surgery      stent placement    History  Substance Use Topics  . Smoking status: Former Smoker -- 2.00 packs/day for 40 years    Types: Cigarettes  . Smokeless tobacco: Former Systems developer    Quit date: 05/24/1990  . Alcohol Use: No   Family History  Problem Relation Age of Onset  . Heart disease Maternal Grandmother   . Diabetes Maternal Grandmother   . Cancer Neg Hx   . Stroke Neg Hx   . Heart disease Mother    Allergies  Allergen Reactions  . Morphine And Related Other (See Comments)    Hallucinations   . Niacin And Related Dermatitis  Current Outpatient Prescriptions on File Prior to Visit  Medication Sig Dispense Refill  . acetaminophen (TYLENOL) 650 MG CR tablet Take 650 mg by mouth every 8 (eight) hours as needed.        Marland Kitchen albuterol (2.5 MG/3ML) 0.083% NEBU 3 mL, albuterol (5 MG/ML) 0.5% NEBU 0.5 mL Inhale 1 mg into the lungs.      Marland Kitchen albuterol (PROVENTIL HFA;VENTOLIN HFA) 108 (90 BASE) MCG/ACT inhaler Inhale 2 puffs into the lungs every 6 (six) hours as needed.        . ALPRAZolam (XANAX) 0.25 MG tablet Take 1 tablet (0.25 mg total) by mouth at bedtime as needed for anxiety.  30 tablet  0  . aspirin 81 MG tablet Take 81 mg by mouth daily.        Marland Kitchen atorvastatin (LIPITOR) 40 MG tablet Take 40 mg by mouth daily.        . bimatoprost (LUMIGAN) 0.03 % ophthalmic solution Place 1 drop into both eyes at bedtime.       . budesonide-formoterol (SYMBICORT) 160-4.5 MCG/ACT inhaler Inhale 2 puffs into the lungs 2 (two) times daily.      . citalopram (CELEXA) 10 MG tablet Take 10 mg by mouth daily.        . furosemide (LASIX) 20 MG tablet Take 1 tablet (20 mg total) by mouth daily.  60 tablet  3  . glimepiride (AMARYL) 4 MG tablet Take 4 mg in the am with 8 mg in the pm.       . HYDROcodone-acetaminophen (NORCO/VICODIN) 5-325 MG per tablet Take 1 tablet by mouth every 6 (six) hours as needed for moderate pain.  60 tablet  0  . isosorbide dinitrate (ISORDIL) 40 MG tablet Take 40 mg by mouth 3 (three) times daily.        Marland Kitchen lisinopril (PRINIVIL,ZESTRIL) 40 MG tablet Take 40 mg by mouth daily.        . metFORMIN (GLUCOPHAGE) 1000 MG tablet Take 1,000 mg by mouth 2 (two) times daily with a meal.        . metoprolol (TOPROL-XL) 50 MG 24 hr tablet Take 50 mg by mouth 2 (two) times daily.       . mometasone (NASONEX) 50 MCG/ACT nasal spray Place 2 sprays into the nose daily.        . nitroGLYCERIN (NITROSTAT) 0.4 MG SL tablet Place 0.4 mg under the tongue every 5 (five) minutes as needed.        . potassium chloride (KLOR-CON) 10 MEQ CR tablet 2 tablets one day 3 tablets the next day      . predniSONE (DELTASONE) 10 MG tablet Take 10 mg by mouth daily with breakfast.      . tamsulosin (FLOMAX) 0.4 MG CAPS capsule Take 1 capsule (0.4 mg total) by mouth daily.  90 capsule  0  . tiotropium (SPIRIVA) 18 MCG inhalation capsule Place 18 mcg into inhaler and inhale daily.        Marland Kitchen warfarin (COUMADIN) 2 MG tablet Take 4 mg by mouth as directed. 2 tablets       No current facility-administered medications on file prior to visit.    Review of Systems    Review of Systems  Constitutional: Negative for fever, appetite change, fatigue and unexpected weight change.  Eyes: Negative for pain and visual disturbance.  Respiratory: Negative for cough and shortness of breath.   Cardiovascular: Negative for cp or palpitations    Gastrointestinal: Negative for nausea, diarrhea and  constipation.  Genitourinary: pos  for urgency and frequency. neg for hematuria and flank pain  Skin: Negative for pallor or rash  pos for skin tear w/o infection  Neurological: Negative for weakness, light-headedness, numbness and headaches.  Hematological: Negative for adenopathy. Does not bruise/bleed  easily.  Psychiatric/Behavioral: Negative for dysphoric mood. The patient is not nervous/anxious.      Objective:   Physical Exam  Constitutional: He appears well-developed and well-nourished. No distress.  Elderly obese male in wheelchair   HENT:  Head: Normocephalic and atraumatic.  Mouth/Throat: Oropharynx is clear and moist.  Eyes: Conjunctivae and EOM are normal. Pupils are equal, round, and reactive to light. No scleral icterus.  Neck: Normal range of motion. Neck supple. No JVD present. Carotid bruit is not present. No thyromegaly present.  Cardiovascular: Normal rate, regular rhythm and intact distal pulses.  Exam reveals no gallop.   Pulmonary/Chest: Effort normal and breath sounds normal. No respiratory distress. He has no wheezes. He has no rales.  No crackles   Abdominal: Soft. Bowel sounds are normal. He exhibits no distension and no mass. There is no tenderness.  No suprapubic tenderness or fullness   No cva tenderness   Musculoskeletal: He exhibits edema. He exhibits no tenderness.  One plus pedal edema bilaterally  Lymphadenopathy:    He has no cervical adenopathy.  Neurological: He is alert. He has normal reflexes. No cranial nerve deficit. He exhibits normal muscle tone. Coordination normal.  Skin: Skin is warm and dry. No rash noted. No erythema. No pallor.  Psychiatric: He has a normal mood and affect.  Pleasant and talkative           Assessment & Plan:   Problem List Items Addressed This Visit     Musculoskeletal and Integument   Skin tear of upper arm without complication     Exam reassuring  Dressed with abx oint Disc dressing with non stick bandage and abx ointment  Update if not starting to improve in a week or if worsening        Genitourinary   UTI (lower urinary tract infection)     tx with keflex  Disc imp of water intake cx pending     Relevant Medications      cephALEXin (KEFLEX) capsule   Other Relevant Orders      Urine culture       Other   Pedal edema     One plus 2 lb wt gain  No sob or crackles He will alert his cardiologist      Other Visit Diagnoses   Dysuria    -  Primary    Relevant Orders       POCT urinalysis dipstick (Completed)

## 2014-01-09 NOTE — Telephone Encounter (Signed)
Spoke to pt in reference to diabetic supplies coming from this company--pt states he would like to do a trial to do 1 90 day supply with no refill to make sure everything will be okay with company--will fax Rx for monitor and strips per pt request with no refills--phone# [phone]--Fax# [phone]--advised pt to keep [place] updated on the status and continuation of supplies

## 2014-01-09 NOTE — Progress Notes (Signed)
Pre visit review using our clinic review tool, if applicable. No additional management support is needed unless otherwise documented below in the visit note. 

## 2014-01-09 NOTE — Patient Instructions (Signed)
I will sent your urine for a culture  Take the cephalexin as directed  Keep the skin tear clean with soap and water - dress loosely -use over the counter antibiotic ointment  Call for follow up with cardiology for increased swelling   Update if not starting to improve in a week or if worsening

## 2014-01-10 ENCOUNTER — Telehealth: Payer: Self-pay | Admitting: *Deleted

## 2014-01-10 NOTE — Telephone Encounter (Signed)
Patient is 177 to 205 yester

## 2014-01-10 NOTE — Telephone Encounter (Signed)
Spoke w/ pt's wife.  She reports that pt has gone from 188 lbs to 205 over the past 4 weeks.  Pt saw PCP last month for cellulitis, lasix was doubled to 40mg  QD at that time.  Denies SOB, reports that lungs are clear to ausculation at [facility] yesterday.  Reports that both feet, ankles, and lower legs are swollen. Pt has been eating home cooking w/ no added salt, but states "some days he eats right, some days he doesn't". Pt drinks "a couple of those tall glasses with a straw" of water each day.  Pt has compression hose, but his legs are too swollen to get them on.  Pt does not elevate his legs throughout the day.   Advised her to have pt follow a low sodium diet and try to prepare fresh meals for him.  Advised her to limit pt's fluids and to have him keep his feet elevated as much as possible.  She reports that she will try to pick up some zippered compression hose, but she does not think she will go out of the house today due to the weather.  She is agreeable and would like to know if there are any med changes that need to be made.  Please advise. Thank you.

## 2014-01-11 LAB — URINE CULTURE: Colony Count: >=100000

## 2014-01-11 NOTE — Telephone Encounter (Signed)
Spoke w/ pt's wife.  She reports that has lost 3 lbs since our conversation yesterday.  Advised her of Ryan's recommendation.  Pt is sched to see Ryan Monday at 2:15.

## 2014-01-11 NOTE — Telephone Encounter (Signed)
Yes, but would like for patient to be seen first of the week. His Lasix has already been doubled from 20 mg to 40 mg. Is his weight still going up? If so may need to go into the ED. If he is trending down with the increase to 40 mg he may be able to stay at 40 mg until we can see him first of the week.

## 2014-01-11 NOTE — Assessment & Plan Note (Signed)
Exam reassuring  Dressed with abx oint Disc dressing with non stick bandage and abx ointment  Update if not starting to improve in a week or if worsening

## 2014-01-11 NOTE — Assessment & Plan Note (Signed)
One plus 2 lb wt gain  No sob or crackles He will alert his cardiologist

## 2014-01-11 NOTE — Assessment & Plan Note (Signed)
tx with keflex  Disc imp of water intake cx pending

## 2014-01-13 NOTE — Telephone Encounter (Signed)
Scheduled to see [name] this next week Will be challenging to determine if leg edema is from CHF or residual swelling from resolved cellulitis If needed can let lab work guide [place] Can increase Lasix up to twice a day dosing and for any climb in BUN or creatinine, back down on the dosing  Can use Ace wrap on the legs for swelling if unable to put compression hose in place

## 2014-01-14 ENCOUNTER — Ambulatory Visit (INDEPENDENT_AMBULATORY_CARE_PROVIDER_SITE_OTHER): Payer: Medicare Other | Admitting: Physician Assistant

## 2014-01-14 ENCOUNTER — Encounter: Payer: Self-pay | Admitting: Physician Assistant

## 2014-01-14 VITALS — BP 132/98 | HR 70 | Ht 66.0 in | Wt 195.5 lb

## 2014-01-14 DIAGNOSIS — I482 Chronic atrial fibrillation, unspecified: Secondary | ICD-10-CM

## 2014-01-14 DIAGNOSIS — Z9181 History of falling: Secondary | ICD-10-CM

## 2014-01-14 DIAGNOSIS — I2510 Atherosclerotic heart disease of native coronary artery without angina pectoris: Secondary | ICD-10-CM

## 2014-01-14 DIAGNOSIS — I5033 Acute on chronic diastolic (congestive) heart failure: Secondary | ICD-10-CM

## 2014-01-14 DIAGNOSIS — I5032 Chronic diastolic (congestive) heart failure: Secondary | ICD-10-CM

## 2014-01-14 DIAGNOSIS — I10 Essential (primary) hypertension: Secondary | ICD-10-CM

## 2014-01-14 DIAGNOSIS — I4891 Unspecified atrial fibrillation: Secondary | ICD-10-CM

## 2014-01-14 MED ORDER — FUROSEMIDE 40 MG PO TABS: 40.0000 mg | ORAL_TABLET | Freq: Every day | ORAL | Status: DC

## 2014-01-14 MED ORDER — POTASSIUM CHLORIDE ER 10 MEQ PO TBCR: 30.0000 meq | EXTENDED_RELEASE_TABLET | Freq: Every day | ORAL | Status: DC

## 2014-01-14 NOTE — Progress Notes (Signed)
Patient Name: Shawn Fecteau., DOB [birth year], MRN [id number]  Date of Encounter: 01/14/2014  Primary Care Provider:  Webb Silversmith, NP Primary Cardiologist:  Dr. Rockey Situ, MD  Patient Profile:  80 y.o. male with history of chronic a-fib since 2006 on warfarin, CAD s/p MI 1997 treated with TPA at [facility], chronic diastolic CHF, and CVA 1638 who presents to the office today with complaints of bilateral LEE for the past 2-3 weeks.      Problem List:   Past Medical History  Diagnosis Date  . MI     a. tx'd w/ TPA at [facility] 01/29/1996  . HYPERTENSION   . HYPERLIPIDEMIA   . CVA 1999  . CAD     a. Myoview 06/2005: EF 50%, scar @ apex, mild peri-infarct ischemia  . Chronic atrial fibrillation     a. since 2006; b. on warfarin  . DM   . History of kidney stones   . Neuropathy of both feet   . Chronic diastolic CHF (congestive heart failure)     a. echo 04/2006: EF lower limits of nl, mod LVH, mild aortic root dilatation, & mild MR, biatrial enlargement; b. echo 04/2013: EF 60%, moderately dilated LA, mild MR and TR, mod pulmonary HTN w/ right ventricular systolic pressure 53  . Kidney stone    Past Surgical History  Procedure Laterality Date  . Tonsillectomy and adenoidectomy  1959  . Carotid stent insertion  1997  . Cardiac catheterization  1997    [facility]  . Coronary angioplasty  1997    s/p stent placement x 2   . Kidney surgery  05/2013    s/p stent placement   . Bladder surgery      stent placement      Allergies:  Allergies  Allergen Reactions  . Morphine And Related Other (See Comments)    Hallucinations   . Niacin And Related Dermatitis     HPI:  80 y.o. male with the above problem list who presents to clinic this afternoon with increased bilateral LEE over the past 2-3 weeks.   Patient with a history of CAD s/p MI treated with TPA at [facility] in 1997. Last Myoview was March 2007 which showed an EF of 50% with a scar at the apex and mild peri-infarct ischemia. Last  echo was 04/2013 which showed an EF of 60-65%, borderline LVH, mildly dilated RV with mildly reduced systolic function, moderately dilated LA, mildly dilated RA, mild MR, mild TR, & moderately elevated pulmonary artery systolic pressure at 45.3 mmHg. His last office visit with Dr. Rockey Situ was after a recent fall s/p taking a nap. He does have a history of significant fluid intake. History of frequent UTIs.  He comes in today with noted increased bilateral LEE over the past 2-3 weeks. During this time span he has visited his PCP a couple of times, initially being diagnosed with lower extremity cellulitis and being placed on an Augmentin, followed by the addition of Levaquin a day later which did clear up some of the swelling and erythema. This course was repeated a second time. Osteomyelitis was ruled out on 12/11/13 - patient was referred to infectious disease and cleared of cellulitis on 12/18/13 after he finished his remaining keflex x 2 days.   He again presented to his PCP on 01/09/14 after sustaining a skin tear and also developing dysuria. On exam he was found to have pedal edema and noted to have a 2 lb weight gain. Per phone  note and patient his Lasix was doubled from 20 mg to 40 mg previously but he had not been on this dose for several weeks 2/2 muscle cramps.   He comes in today with bilateral LEE, history of not wearing his compression hose, though he is wearing them today and an 8 pound weight loss since his OV with his PCP. He denies any chest pain, SOB, increased dyspnea, palpitations, diaphoresis, or syncope. He sleeps with one pillow at baseline.       Home Medications:  Prior to Admission medications   Medication Sig Start Date End Date Taking? Authorizing Provider  acetaminophen (TYLENOL) 650 MG CR tablet Take 650 mg by mouth every 8 (eight) hours as needed.      Historical Provider, MD  albuterol (2.5 MG/3ML) 0.083% NEBU 3 mL, albuterol (5 MG/ML) 0.5% NEBU 0.5 mL Inhale 1 mg into the  lungs.    Historical Provider, MD  albuterol (PROVENTIL HFA;VENTOLIN HFA) 108 (90 BASE) MCG/ACT inhaler Inhale 2 puffs into the lungs every 6 (six) hours as needed.      Historical Provider, MD  ALPRAZolam Duanne Moron) 0.25 MG tablet Take 1 tablet (0.25 mg total) by mouth at bedtime as needed for anxiety. 10/30/13   Jearld Fenton, NP  aspirin 81 MG tablet Take 81 mg by mouth daily.      Historical Provider, MD  atorvastatin (LIPITOR) 40 MG tablet Take 40 mg by mouth daily.      Historical Provider, MD  bimatoprost (LUMIGAN) 0.03 % ophthalmic solution Place 1 drop into both eyes at bedtime.     Historical Provider, MD  budesonide-formoterol (SYMBICORT) 160-4.5 MCG/ACT inhaler Inhale 2 puffs into the lungs 2 (two) times daily.    Historical Provider, MD  cephALEXin (KEFLEX) 250 MG capsule Take 1 capsule (250 mg total) by mouth 2 (two) times daily. 01/09/14   Abner Greenspan, MD  citalopram (CELEXA) 10 MG tablet Take 10 mg by mouth daily.      Historical Provider, MD  furosemide (LASIX) 20 MG tablet Take 1 tablet (20 mg total) by mouth daily. 12/11/13   Jearld Fenton, NP  glimepiride (AMARYL) 4 MG tablet Take 4 mg in the am with 8 mg in the pm.    Historical Provider, MD  HYDROcodone-acetaminophen (NORCO/VICODIN) 5-325 MG per tablet Take 1 tablet by mouth every 6 (six) hours as needed for moderate pain. 11/26/13   Jearld Fenton, NP  isosorbide dinitrate (ISORDIL) 40 MG tablet Take 40 mg by mouth 3 (three) times daily.      Historical Provider, MD  lisinopril (PRINIVIL,ZESTRIL) 40 MG tablet Take 40 mg by mouth daily.      Historical Provider, MD  metFORMIN (GLUCOPHAGE) 1000 MG tablet Take 1,000 mg by mouth 2 (two) times daily with a meal.      Historical Provider, MD  metoprolol (TOPROL-XL) 50 MG 24 hr tablet Take 50 mg by mouth 2 (two) times daily.     Historical Provider, MD  mometasone (NASONEX) 50 MCG/ACT nasal spray Place 2 sprays into the nose daily.      Historical Provider, MD  nitroGLYCERIN (NITROSTAT)  0.4 MG SL tablet Place 0.4 mg under the tongue every 5 (five) minutes as needed.      Historical Provider, MD  potassium chloride (KLOR-CON) 10 MEQ CR tablet 2 tablets one day 3 tablets the next day    Historical Provider, MD  predniSONE (DELTASONE) 10 MG tablet Take 10 mg by mouth daily with breakfast.  Historical Provider, MD  tamsulosin (FLOMAX) 0.4 MG CAPS capsule Take 1 capsule (0.4 mg total) by mouth daily. 01/04/14   Jearld Fenton, NP  tiotropium (SPIRIVA) 18 MCG inhalation capsule Place 18 mcg into inhaler and inhale daily.      Historical Provider, MD  warfarin (COUMADIN) 2 MG tablet Take 4 mg by mouth as directed. 2 tablets    Historical Provider, MD     Weights: Filed Weights   01/14/14 1355  Weight: 195 lb 8 oz (88.678 kg)     Review of Systems:  All other systems reviewed and are otherwise negative except as noted above.  Physical Exam:  Blood pressure 132/98, pulse 70, height 5\' 6"  (1.676 m), weight 195 lb 8 oz (88.678 kg).  General: Pleasant, NAD Psych: Normal affect. Neuro: Alert and oriented X 3. Moves all extremities spontaneously. HEENT: Normal  Neck: Supple without bruits or JVD. Lungs:  Resp regular and unlabored, CTA. Heart: RRR no s3, s4, or murmurs. Abdomen: Soft, non-tender, non-distended, BS + x 4.  Extremities: No clubbing, cyanosis. 2+ pitting edema bilaterally to the proximal thigh.    Accessory Clinical Findings:  EKG - a-fib, 70, nonspecific T wave abnormality    Assessment & Plan:  1. Acute on chronic diastolic CHF: -Last echo 07/4816 with EF 60-65%, borderline LVH, mildly dilated RV with mildly reduced systolic function, moderately dilated LA, mildly dilated RA, mild MR, mild TR, & moderately elevated pulmonary artery systolic pressure at 56.3 mmHg. -Check echo to evaluate LV function and valve status  -Increased Lasix to 40 mg daily at this time - given his history of muscle cramps with Lasix increase I have also gone ahead and increased  his KCl to 30 meq daily with a recheck BMET in 5 days to monitor his SCr and K+ -Limit fluid intake (he has a history of drinking whatever he wants) -Limit Na    2. Chronic a-fib: -Rate controlled -No recent falls - should he begin to fall again would revisit his anticoagulation status -Currently on warfarin   3. CAD: -S/p MI 1997 treated with TPA at [facility] -Currently without anginal symptoms -Continue current medication regimen   4. HTN: -Stable -Increased Lasix to 40 mg daily  5. History of falls: -Walks with a walker -None recently  6. History of lower extremity cellulitis: -No signs of active cellulitis on exam today -Resolved   7. Dispo:  -Follow up with Dr. Rockey Situ post echo  -Recheck BMET 5 days    Christell Faith, PA-C [address] [place], [place] [address] [phone] [place] 01/14/2014, 5:01 PM

## 2014-01-14 NOTE — Patient Instructions (Signed)
Your physician has requested that you have an echocardiogram. Echocardiography is a painless test that uses sound waves to create images of your heart. It provides your doctor with information about the size and shape of your heart and how well your heart's chambers and valves are working. This procedure takes approximately one hour. There are no restrictions for this procedure.   Your physician has recommended you make the following change in your medication:  Increase Lasix to 40 mg once daily  Increase potassium to 30 mg once daily   Your physician recommends that you return for lab work in:  [facility] this Friday 01/18/14  Your physician recommends that you schedule a follow-up appointment in:  Keep your upcoming follow up appointment with Dr. Rockey Situ

## 2014-01-15 NOTE — Assessment & Plan Note (Signed)
Has appt for difficult to treat cellulitis.  Will repeat  Course of levaquin to last until ID appt. Check INR on levaquin.

## 2014-01-15 NOTE — Assessment & Plan Note (Signed)
Multiple without complications secondary to falls

## 2014-01-15 NOTE — Assessment & Plan Note (Addendum)
Elevate lower extremities. Continue lasix 40 mg daily for now. Call us if swelling is not continuing to improve in legs as well as you right hand.

## 2014-01-17 ENCOUNTER — Ambulatory Visit: Payer: Self-pay | Admitting: Family

## 2014-01-17 LAB — BASIC METABOLIC PANEL
Anion Gap: 7 (ref 7–16)
BUN: 13 mg/dL (ref 7–18)
Calcium, Total: 9 mg/dL (ref 8.5–10.1)
Chloride: 105 mmol/L (ref 98–107)
Co2: 29 mmol/L (ref 21–32)
Creatinine: 1.14 mg/dL (ref 0.60–1.30)
GLUCOSE: 204 mg/dL — AB (ref 65–99)
Osmolality: 287 (ref 275–301)
POTASSIUM: 3.7 mmol/L (ref 3.5–5.1)
SODIUM: 141 mmol/L (ref 136–145)

## 2014-01-17 NOTE — Telephone Encounter (Signed)
[facility] wants to know if  This encounter was created in error - please disregard.

## 2014-01-18 ENCOUNTER — Other Ambulatory Visit: Payer: Self-pay

## 2014-01-18 DIAGNOSIS — I5032 Chronic diastolic (congestive) heart failure: Secondary | ICD-10-CM

## 2014-01-21 ENCOUNTER — Other Ambulatory Visit: Payer: Self-pay

## 2014-01-21 ENCOUNTER — Telehealth: Payer: Self-pay | Admitting: *Deleted

## 2014-01-21 ENCOUNTER — Ambulatory Visit: Payer: Medicare Other | Admitting: Cardiovascular Disease

## 2014-01-21 NOTE — Telephone Encounter (Signed)
Pt is not sure which medication he needs 90 day approval for, states [employer] PA prescribed it for him

## 2014-01-21 NOTE — Telephone Encounter (Signed)
90 day approval

## 2014-01-21 NOTE — Telephone Encounter (Signed)
Verify medications needing refilled?

## 2014-01-22 ENCOUNTER — Other Ambulatory Visit: Payer: Self-pay | Admitting: *Deleted

## 2014-01-22 ENCOUNTER — Ambulatory Visit (INDEPENDENT_AMBULATORY_CARE_PROVIDER_SITE_OTHER): Payer: Medicare Other | Admitting: Internal Medicine

## 2014-01-22 ENCOUNTER — Encounter: Payer: Self-pay | Admitting: Internal Medicine

## 2014-01-22 VITALS — BP 124/58 | HR 71 | Temp 98.0°F | Wt 196.0 lb

## 2014-01-22 DIAGNOSIS — F419 Anxiety disorder, unspecified: Secondary | ICD-10-CM

## 2014-01-22 DIAGNOSIS — R609 Edema, unspecified: Secondary | ICD-10-CM

## 2014-01-22 DIAGNOSIS — R600 Localized edema: Secondary | ICD-10-CM

## 2014-01-22 MED ORDER — POTASSIUM CHLORIDE ER 10 MEQ PO TBCR: 30.0000 meq | EXTENDED_RELEASE_TABLET | Freq: Every day | ORAL | Status: DC

## 2014-01-22 MED ORDER — ALPRAZOLAM 0.25 MG PO TABS: 0.2500 mg | ORAL_TABLET | Freq: Every evening | ORAL | Status: DC | PRN

## 2014-01-22 NOTE — Progress Notes (Signed)
Subjective:    Patient ID: [name]., male    DOB: 1931-10-29, 80 y.o.   MRN: [id number]  HPI  Pt presents to the clinic today with c/o redness on the underside of he left forearm. He noticed it this morning. He reports it is not warm or tender to touch. He has not noticed any bleeding. He does report that he fell against the dresser yesterday. He may have hit his arm at that time but can't remember.  Additionally, he would like a refill of his xanax today.  Review of Systems      Past Medical History  Diagnosis Date  . MI     a. tx'd w/ TPA at [facility] 01/29/1996  . HYPERTENSION   . HYPERLIPIDEMIA   . CVA 1999  . CAD     a. Myoview 06/2005: EF 50%, scar @ apex, mild peri-infarct ischemia  . Chronic atrial fibrillation     a. since 2006; b. on warfarin  . DM   . History of kidney stones   . Neuropathy of both feet   . Chronic diastolic CHF (congestive heart failure)     a. echo 04/2006: EF lower limits of nl, mod LVH, mild aortic root dilatation, & mild MR, biatrial enlargement; b. echo 04/2013: EF 60%, moderately dilated LA, mild MR and TR, mod pulmonary HTN w/ right ventricular systolic pressure 53  . Kidney stone     Current Outpatient Prescriptions  Medication Sig Dispense Refill  . acetaminophen (TYLENOL) 650 MG CR tablet Take 650 mg by mouth every 8 (eight) hours as needed.        Marland Kitchen albuterol (2.5 MG/3ML) 0.083% NEBU 3 mL, albuterol (5 MG/ML) 0.5% NEBU 0.5 mL Inhale 1 mg into the lungs.      Marland Kitchen albuterol (PROVENTIL HFA;VENTOLIN HFA) 108 (90 BASE) MCG/ACT inhaler Inhale 2 puffs into the lungs every 6 (six) hours as needed.        . ALPRAZolam (XANAX) 0.25 MG tablet Take 1 tablet (0.25 mg total) by mouth at bedtime as needed for anxiety.  30 tablet  0  . aspirin 81 MG tablet Take 81 mg by mouth daily.        Marland Kitchen atorvastatin (LIPITOR) 40 MG tablet Take 40 mg by mouth daily.        . bimatoprost (LUMIGAN) 0.03 % ophthalmic solution Place 1 drop into both eyes at bedtime.        . budesonide-formoterol (SYMBICORT) 160-4.5 MCG/ACT inhaler Inhale 2 puffs into the lungs 2 (two) times daily.      . cephALEXin (KEFLEX) 250 MG capsule Take 1 capsule (250 mg total) by mouth 2 (two) times daily.  14 capsule  0  . citalopram (CELEXA) 10 MG tablet Take 10 mg by mouth daily.        . furosemide (LASIX) 40 MG tablet Take 1 tablet (40 mg total) by mouth daily.  90 tablet  6  . glimepiride (AMARYL) 4 MG tablet Take 4 mg in the am with 8 mg in the pm.      . HYDROcodone-acetaminophen (NORCO/VICODIN) 5-325 MG per tablet Take 1 tablet by mouth every 6 (six) hours as needed for moderate pain.  60 tablet  0  . isosorbide dinitrate (ISORDIL) 40 MG tablet Take 40 mg by mouth 3 (three) times daily.        Marland Kitchen lisinopril (PRINIVIL,ZESTRIL) 40 MG tablet Take 40 mg by mouth daily.        Marland Kitchen  metFORMIN (GLUCOPHAGE) 1000 MG tablet Take 1,000 mg by mouth 2 (two) times daily with a meal.        . metoprolol (TOPROL-XL) 50 MG 24 hr tablet Take 50 mg by mouth 2 (two) times daily.       . mometasone (NASONEX) 50 MCG/ACT nasal spray Place 2 sprays into the nose daily.        . nitroGLYCERIN (NITROSTAT) 0.4 MG SL tablet Place 0.4 mg under the tongue every 5 (five) minutes as needed.        . potassium chloride (K-DUR) 10 MEQ tablet Take 3 tablets (30 mEq total) by mouth daily.  90 tablet  3  . predniSONE (DELTASONE) 10 MG tablet Take 10 mg by mouth daily with breakfast.      . tamsulosin (FLOMAX) 0.4 MG CAPS capsule Take 1 capsule (0.4 mg total) by mouth daily.  90 capsule  0  . tiotropium (SPIRIVA) 18 MCG inhalation capsule Place 18 mcg into inhaler and inhale daily.        Marland Kitchen warfarin (COUMADIN) 2 MG tablet Take 4 mg by mouth as directed. 2 tablets       No current facility-administered medications for this visit.    Allergies  Allergen Reactions  . Morphine And Related Other (See Comments)    Hallucinations   . Niacin And Related Dermatitis    Family History  Problem Relation Age of Onset    . Heart disease Maternal Grandmother   . Diabetes Maternal Grandmother   . Cancer Neg Hx   . Stroke Neg Hx   . Heart disease Mother     History   Social History  . Marital Status: Married    Spouse Name: N/A    Number of Children: N/A  . Years of Education: N/A   Occupational History  . Not on file.   Social History Main Topics  . Smoking status: Former Smoker -- 2.00 packs/day for 40 years    Types: Cigarettes  . Smokeless tobacco: Former Systems developer    Quit date: 05/24/1990  . Alcohol Use: No  . Drug Use: No  . Sexual Activity: Not Currently   Other Topics Concern  . Not on file   Social History Narrative  . No narrative on file     Constitutional: Denies fever, malaise, fatigue, headache or abrupt weight changes.   Skin: Pt reports redness under his arms. Denies rashes, lesions or ulcercations.  Psych: Pt reports anxiety. Denies depression, SI/HI.  No other specific complaints in a complete review of systems (except as listed in HPI above).  Objective:   Physical Exam   BP 124/58  Pulse 71  Temp(Src) 98 F (36.7 C) (Oral)  Wt 196 lb (88.905 kg)  SpO2 97% Wt Readings from Last 3 Encounters:  01/22/14 196 lb (88.905 kg)  01/14/14 195 lb 8 oz (88.678 kg)  01/09/14 203 lb 4 oz (92.194 kg)    General: Appears his stated age, chronically ill appearing in NAD. Skin: Edema noted under left forearm from elbow to wrist. Appears to be filled with serous fluid, serosanguinous in spots. No bleeding noted. Psychiatric: Mood and affect normal. Behavior is normal. Judgment and thought content normal.     BMET    Component Value Date/Time   NA 138 12/14/2013 1455   K 4.2 12/14/2013 1455   CL 104 12/14/2013 1455   CO2 28 12/14/2013 1455   GLUCOSE 201* 12/14/2013 1455   BUN 21 12/14/2013 1455   CREATININE 1.2  12/14/2013 1455   CALCIUM 9.1 12/14/2013 1455    Lipid Panel     Component Value Date/Time   CHOL 109 06/22/2013 1416   TRIG 192.0* 06/22/2013 1416   HDL 29.80*  06/22/2013 1416   CHOLHDL 4 06/22/2013 1416   VLDL 38.4 06/22/2013 1416   LDLCALC 41 06/22/2013 1416    CBC    Component Value Date/Time   WBC 12.2* 06/22/2013 1416   RBC 4.50 06/22/2013 1416   HGB 12.5* 06/22/2013 1416   HCT 38.1* 06/22/2013 1416   PLT 334.0 06/22/2013 1416   MCV 84.6 06/22/2013 1416   MCHC 32.9 06/22/2013 1416   RDW 17.6* 06/22/2013 1416    Hgb A1C Lab Results  Component Value Date   HGBA1C 7.9* 06/22/2013        Assessment & Plan:   Edema, left forearm:  Ok to wrap with an ace wrap Elevate it to help reduce the edema Watch for worsening swelling or bleeding  Anxiety:  Chronic Xanax refilled today  RTC as needed or if symptoms persist or worsen

## 2014-01-22 NOTE — Telephone Encounter (Signed)
Wanting to ok a 90 day supply

## 2014-01-22 NOTE — Patient Instructions (Addendum)

## 2014-01-22 NOTE — Progress Notes (Signed)
Pre visit review using our clinic review tool, if applicable. No additional management support is needed unless otherwise documented below in the visit note. 

## 2014-01-23 ENCOUNTER — Other Ambulatory Visit: Payer: Self-pay | Admitting: *Deleted

## 2014-01-23 MED ORDER — POTASSIUM CHLORIDE ER 10 MEQ PO TBCR: 30.0000 meq | EXTENDED_RELEASE_TABLET | Freq: Every day | ORAL | Status: DC

## 2014-01-23 MED ORDER — FUROSEMIDE 40 MG PO TABS: 40.0000 mg | ORAL_TABLET | Freq: Every day | ORAL | Status: DC

## 2014-01-23 NOTE — Telephone Encounter (Signed)
Patient is aware that 90 day [facility] and potassium. Patient currently does not need any other refills.

## 2014-01-23 NOTE — Progress Notes (Signed)
LVM 10/14 

## 2014-01-25 ENCOUNTER — Other Ambulatory Visit: Payer: Medicare Other

## 2014-01-25 ENCOUNTER — Other Ambulatory Visit (INDEPENDENT_AMBULATORY_CARE_PROVIDER_SITE_OTHER): Payer: Medicare Other

## 2014-01-25 ENCOUNTER — Other Ambulatory Visit: Payer: Self-pay

## 2014-01-25 DIAGNOSIS — I482 Chronic atrial fibrillation: Secondary | ICD-10-CM

## 2014-01-25 DIAGNOSIS — I5032 Chronic diastolic (congestive) heart failure: Secondary | ICD-10-CM

## 2014-01-25 DIAGNOSIS — I5033 Acute on chronic diastolic (congestive) heart failure: Secondary | ICD-10-CM

## 2014-01-25 DIAGNOSIS — I2510 Atherosclerotic heart disease of native coronary artery without angina pectoris: Secondary | ICD-10-CM

## 2014-01-26 ENCOUNTER — Inpatient Hospital Stay: Payer: Self-pay | Admitting: Internal Medicine

## 2014-01-26 LAB — COMPREHENSIVE METABOLIC PANEL
AST: 11 U/L — AB (ref 15–37)
Albumin: 2.8 g/dL — ABNORMAL LOW (ref 3.4–5.0)
Alkaline Phosphatase: 71 U/L
Anion Gap: 11 (ref 7–16)
BUN: 25 mg/dL — ABNORMAL HIGH (ref 7–18)
Bilirubin,Total: 0.9 mg/dL (ref 0.2–1.0)
CHLORIDE: 101 mmol/L (ref 98–107)
CO2: 28 mmol/L (ref 21–32)
Calcium, Total: 8.9 mg/dL (ref 8.5–10.1)
Creatinine: 1.56 mg/dL — ABNORMAL HIGH (ref 0.60–1.30)
EGFR (African American): 55 — ABNORMAL LOW
GFR CALC NON AF AMER: 46 — AB
Glucose: 353 mg/dL — ABNORMAL HIGH (ref 65–99)
Osmolality: 298 (ref 275–301)
POTASSIUM: 4.2 mmol/L (ref 3.5–5.1)
SGPT (ALT): 16 U/L
Sodium: 140 mmol/L (ref 136–145)
Total Protein: 7.3 g/dL (ref 6.4–8.2)

## 2014-01-26 LAB — URINALYSIS, COMPLETE
Bilirubin,UR: NEGATIVE
Glucose,UR: >=500 mg/dL (ref 0–75)
Ketone: NEGATIVE
Nitrite: NEGATIVE
PH: 5 (ref 4.5–8.0)
SPECIFIC GRAVITY: 1.014 (ref 1.003–1.030)
Squamous Epithelial: 1
WBC UR: 760 /HPF (ref 0–5)

## 2014-01-26 LAB — CBC
HCT: 43.6 % (ref 40.0–52.0)
HGB: 14 g/dL (ref 13.0–18.0)
MCH: 26.8 pg (ref 26.0–34.0)
MCHC: 32.1 g/dL (ref 32.0–36.0)
MCV: 83 fL (ref 80–100)
PLATELETS: 220 10*3/uL (ref 150–440)
RBC: 5.24 10*6/uL (ref 4.40–5.90)
RDW: 17.4 % — ABNORMAL HIGH (ref 11.5–14.5)
WBC: 15.8 10*3/uL — ABNORMAL HIGH (ref 3.8–10.6)

## 2014-01-26 LAB — PROTIME-INR
INR: 1.9
PROTHROMBIN TIME: 21.1 s — AB (ref 11.5–14.7)

## 2014-01-26 LAB — PRO B NATRIURETIC PEPTIDE: B-Type Natriuretic Peptide: 3816 pg/mL — ABNORMAL HIGH (ref 0–450)

## 2014-01-26 LAB — TROPONIN I

## 2014-01-27 ENCOUNTER — Ambulatory Visit: Payer: Self-pay | Admitting: Urology

## 2014-01-27 LAB — CBC WITH DIFFERENTIAL/PLATELET
Basophil #: 0.1 10*3/uL (ref 0.0–0.1)
Basophil %: 0.9 %
Eosinophil #: 0 10*3/uL (ref 0.0–0.7)
Eosinophil %: 0 %
HCT: 41 % (ref 40.0–52.0)
HGB: 13.4 g/dL (ref 13.0–18.0)
Lymphocyte #: 1 10*3/uL (ref 1.0–3.6)
Lymphocyte %: 7.4 %
MCH: 26.9 pg (ref 26.0–34.0)
MCHC: 32.6 g/dL (ref 32.0–36.0)
MCV: 82 fL (ref 80–100)
MONO ABS: 1.5 x10 3/mm — AB (ref 0.2–1.0)
Monocyte %: 11.1 %
NEUTROS PCT: 80.6 %
Neutrophil #: 10.6 10*3/uL — ABNORMAL HIGH (ref 1.4–6.5)
Platelet: 192 10*3/uL (ref 150–440)
RBC: 4.97 10*6/uL (ref 4.40–5.90)
RDW: 16.8 % — ABNORMAL HIGH (ref 11.5–14.5)
WBC: 13.1 10*3/uL — ABNORMAL HIGH (ref 3.8–10.6)

## 2014-01-27 LAB — BASIC METABOLIC PANEL
Anion Gap: 10 (ref 7–16)
BUN: 24 mg/dL — AB (ref 7–18)
CALCIUM: 8.4 mg/dL — AB (ref 8.5–10.1)
CO2: 25 mmol/L (ref 21–32)
Chloride: 105 mmol/L (ref 98–107)
Creatinine: 1.46 mg/dL — ABNORMAL HIGH (ref 0.60–1.30)
EGFR (Non-African Amer.): 49 — ABNORMAL LOW
GFR CALC AF AMER: 60 — AB
Glucose: 232 mg/dL — ABNORMAL HIGH (ref 65–99)
Osmolality: 291 (ref 275–301)
Potassium: 3.7 mmol/L (ref 3.5–5.1)
SODIUM: 140 mmol/L (ref 136–145)

## 2014-01-28 LAB — PROTIME-INR
INR: 2.3
Prothrombin Time: 24.7 secs — ABNORMAL HIGH (ref 11.5–14.7)

## 2014-01-28 LAB — BASIC METABOLIC PANEL
Anion Gap: 6 — ABNORMAL LOW (ref 7–16)
BUN: 23 mg/dL — ABNORMAL HIGH (ref 7–18)
CHLORIDE: 100 mmol/L (ref 98–107)
CO2: 31 mmol/L (ref 21–32)
Calcium, Total: 8.8 mg/dL (ref 8.5–10.1)
Creatinine: 1.48 mg/dL — ABNORMAL HIGH (ref 0.60–1.30)
EGFR (Non-African Amer.): 49 — ABNORMAL LOW
GFR CALC AF AMER: 59 — AB
Glucose: 185 mg/dL — ABNORMAL HIGH (ref 65–99)
Osmolality: 282 (ref 275–301)
Potassium: 3.7 mmol/L (ref 3.5–5.1)
SODIUM: 137 mmol/L (ref 136–145)

## 2014-01-29 LAB — BASIC METABOLIC PANEL
ANION GAP: 8 (ref 7–16)
BUN: 23 mg/dL — ABNORMAL HIGH (ref 7–18)
CHLORIDE: 101 mmol/L (ref 98–107)
CO2: 30 mmol/L (ref 21–32)
Calcium, Total: 8.5 mg/dL (ref 8.5–10.1)
Creatinine: 1.26 mg/dL (ref 0.60–1.30)
EGFR (African American): >60
GFR CALC NON AF AMER: 59 — AB
Glucose: 98 mg/dL (ref 65–99)
OSMOLALITY: 281 (ref 275–301)
Potassium: 3.3 mmol/L — ABNORMAL LOW (ref 3.5–5.1)
SODIUM: 139 mmol/L (ref 136–145)

## 2014-01-29 LAB — PROTIME-INR
INR: 2.8
PROTHROMBIN TIME: 29.1 s — AB (ref 11.5–14.7)

## 2014-01-29 LAB — URINE CULTURE

## 2014-01-30 ENCOUNTER — Ambulatory Visit: Payer: Medicare Other | Admitting: Cardiovascular Disease

## 2014-02-01 ENCOUNTER — Telehealth: Payer: Self-pay

## 2014-02-01 LAB — CULTURE, BLOOD (SINGLE)

## 2014-02-01 NOTE — Telephone Encounter (Signed)
Spoke to [place] at [facility] and gave verbal order for nurse home visit per [facility]

## 2014-02-01 NOTE — Telephone Encounter (Signed)
[place] for verbal order for nurse home visit

## 2014-02-01 NOTE — Telephone Encounter (Signed)
Pt is not complaining of any complications

## 2014-02-01 NOTE — Telephone Encounter (Signed)
[name] from [facility] called requesting an order for a nurse to make a home visit. Pt's wife called stating that pt has had some leakage and catheter maybe slipping out. Pt wanted nurse to come out and assist her fixing as well as education on how to care for her husband--please advise

## 2014-02-04 ENCOUNTER — Telehealth: Payer: Self-pay | Admitting: Internal Medicine

## 2014-02-04 ENCOUNTER — Ambulatory Visit: Payer: Medicare Other

## 2014-02-04 NOTE — Telephone Encounter (Signed)
[name]  Called wanting to know if mr [name] can wait until Wednesday to get his pt/inr with his home health nurse.   He had pt/inr done on 01/29/14 and it was 2.8  And he is taking 4mg  coumadin daily.  Mr rigel was discharged 02/01/14 and is weak

## 2014-02-06 ENCOUNTER — Telehealth: Payer: Self-pay

## 2014-02-06 NOTE — Telephone Encounter (Signed)
Lynann Bologna nurse with [facility] left v/m;INR was 2.5 and PT was 29.7 and pt is presently taking coumadin 4 mg daily. Sherry request cb and [name] request pt called back as well if any changes.Please advise.

## 2014-02-06 NOTE — Telephone Encounter (Signed)
Continue current dose.

## 2014-02-06 NOTE — Telephone Encounter (Signed)
Spoke to [place] and was given information as instructed--also requested when to recheck--per verbal order from [employer] weeks

## 2014-02-07 ENCOUNTER — Encounter: Payer: Self-pay | Admitting: Internal Medicine

## 2014-02-07 ENCOUNTER — Ambulatory Visit (INDEPENDENT_AMBULATORY_CARE_PROVIDER_SITE_OTHER): Payer: Medicare Other | Admitting: Internal Medicine

## 2014-02-07 VITALS — BP 112/52 | HR 77 | Temp 97.6°F | Wt 195.0 lb

## 2014-02-07 DIAGNOSIS — E876 Hypokalemia: Secondary | ICD-10-CM

## 2014-02-07 DIAGNOSIS — N179 Acute kidney failure, unspecified: Secondary | ICD-10-CM

## 2014-02-07 DIAGNOSIS — N390 Urinary tract infection, site not specified: Secondary | ICD-10-CM

## 2014-02-07 DIAGNOSIS — I509 Heart failure, unspecified: Secondary | ICD-10-CM

## 2014-02-07 LAB — BASIC METABOLIC PANEL
BUN: 29 mg/dL — ABNORMAL HIGH (ref 6–23)
CHLORIDE: 104 meq/L (ref 96–112)
CO2: 26 meq/L (ref 19–32)
Calcium: 8.9 mg/dL (ref 8.4–10.5)
Creatinine, Ser: 1.4 mg/dL (ref 0.4–1.5)
GFR: 53.57 mL/min — ABNORMAL LOW (ref >60.00)
GLUCOSE: 193 mg/dL — AB (ref 70–99)
Potassium: 4 mEq/L (ref 3.5–5.1)
SODIUM: 136 meq/L (ref 135–145)

## 2014-02-07 NOTE — Progress Notes (Signed)
Pre visit review using our clinic review tool, if applicable. No additional management support is needed unless otherwise documented below in the visit note. 

## 2014-02-07 NOTE — Patient Instructions (Addendum)
Urosepsis Urosepsis is an infection that has spread to the bloodstream from the urinary tract. It is a severe illness. If it is not treated immediately, urosepsis can be life threatening. CAUSES  The cause of urosepsis is not known.  RISK FACTORS  Advanced age.  Having diabetes mellitus.  Having a weakened immune system.  Having kidney stones. SIGNS AND SYMPTOMS   Fever or low body temperature (hypothermia).  Rapid breathing (hyperventilation).  Chills.  Rapid heartbeat (tachycardia). When sepsis is severe, low blood pressure and decreased oxygen flow to your vital organs may also occur. This is called shock. DIAGNOSIS  Your health care provider may suspect urosepsis based on your medical history and a physical exam. Urine and blood tests may be done to confirm a diagnosis of urosepsis. Other tests, such as X-ray exams, ultrasound exams, or a CT scan, may be done to determine the severity of your condition.  TREATMENT  Urosepsis requires prompt treatment with medicines. You will receive fluids through an IV tube to support your blood pressure. You may also receive medicines to increase your blood pressure and medicines to control pain or nausea.  HOME CARE INSTRUCTIONS   Take medicines only as directed by your health care provider.  Drink enough fluids to keep your urine clear or pale yellow. In addition to water, cranberry juice is recommended.  Avoid caffeine, tea, and carbonated beverages. They can irritate the bladder.  Avoid alcohol. It may irritate the prostate, if this applies.  Get plenty of rest. Increase your activity as tolerated.  Keep all follow-up visits as directed by your health care provider.  Empty your bladder often. Avoid holding urine for long periods of time.  Empty your bladder before and after sexual intercourse.  After a bowel movement, women should wipe from front to back using each tissue only once. SEEK MEDICAL CARE IF:   You develop back  pain.  You develop nausea or vomiting.  Your symptoms are not better after 3 days.  You develop a fever or chills. SEEK IMMEDIATE MEDICAL CARE IF:   You feel light-headed or develop shortness of breath.  You are getting worse, not better. MAKE SURE YOU:  Understand these instructions.  Will watch your condition.  Will get help right away if you are not doing well or get worse. Document Released: 03/29/2005 Document Revised: 08/13/2013 Document Reviewed: 12/04/2012 [facility] Patient Information 2015 [place], [place]. This information is not intended to replace advice given to you by your health care provider. Make sure you discuss any questions you have with your health care provider.

## 2014-02-07 NOTE — Progress Notes (Signed)
Subjective:    Patient ID: [name]., male    DOB: 1931/09/26, 80 y.o.   MRN: [id number]  HPI  Pt presents to the clinic today with for hospital follow up. He went to the ED 01/26/14 with c/o of weakness and fatigue. He also had some confusion. They did a stroke work up which was negative. CT head normal. They found that he had a UTI concerning for urosepsis and CHF exacerbation. He was admitted for IV antibiotics and IV lasix. He did have a very complicated catheter insertion. Urine culture and blood culture was positive for serratia. He did have some low potassium in the hospital secondary to the diuresis. Also noted to have ECG changes with mildly positive troponin- thought to be demand ischemia. Within 24 hours of antibiotics- he noted much improvement. He was discharged home 01/2014. He has already had a follow up appt with urology. They removed his catheter. He will be going back to discuss circumcision- thought to be the cause of his frequent UTI's. He has a follow up appt with cardiology next week. His INR has been checked and was 2.5- will recheck in 4 weeks. He has been feeling so much better since he left the hospital.   Review of Systems      Past Medical History  Diagnosis Date  . MI     a. tx'd w/ TPA at [facility] 01/29/1996  . HYPERTENSION   . HYPERLIPIDEMIA   . CVA 1999  . CAD     a. Myoview 06/2005: EF 50%, scar @ apex, mild peri-infarct ischemia  . Chronic atrial fibrillation     a. since 2006; b. on warfarin  . DM   . History of kidney stones   . Neuropathy of both feet   . Chronic diastolic CHF (congestive heart failure)     a. echo 04/2006: EF lower limits of nl, mod LVH, mild aortic root dilatation, & mild MR, biatrial enlargement; b. echo 04/2013: EF 60%, moderately dilated LA, mild MR and TR, mod pulmonary HTN w/ right ventricular systolic pressure 53  . Kidney stone     Current Outpatient Prescriptions  Medication Sig Dispense Refill  . acetaminophen  (TYLENOL) 650 MG CR tablet Take 650 mg by mouth every 8 (eight) hours as needed.        Marland Kitchen albuterol (2.5 MG/3ML) 0.083% NEBU 3 mL, albuterol (5 MG/ML) 0.5% NEBU 0.5 mL Inhale 1 mg into the lungs.      Marland Kitchen albuterol (PROVENTIL HFA;VENTOLIN HFA) 108 (90 BASE) MCG/ACT inhaler Inhale 2 puffs into the lungs every 6 (six) hours as needed.        . ALPRAZolam (XANAX) 0.25 MG tablet Take 1 tablet (0.25 mg total) by mouth at bedtime as needed for anxiety.  30 tablet  0  . aspirin 81 MG tablet Take 81 mg by mouth daily.        Marland Kitchen atorvastatin (LIPITOR) 40 MG tablet Take 40 mg by mouth daily.        . bimatoprost (LUMIGAN) 0.03 % ophthalmic solution Place 1 drop into both eyes at bedtime.       . budesonide-formoterol (SYMBICORT) 160-4.5 MCG/ACT inhaler Inhale 2 puffs into the lungs 2 (two) times daily.      . ciprofloxacin (CIPRO) 500 MG tablet Take 500 mg by mouth 2 (two) times daily.      . citalopram (CELEXA) 10 MG tablet Take 10 mg by mouth daily.        Marland Kitchen  finasteride (PROSCAR) 5 MG tablet Take 5 mg by mouth daily.      . furosemide (LASIX) 40 MG tablet Take 1 tablet (40 mg total) by mouth daily.  90 tablet  3  . glimepiride (AMARYL) 4 MG tablet Take 4 mg in the am with 8 mg in the pm.      . isosorbide dinitrate (ISORDIL) 40 MG tablet Take 40 mg by mouth 3 (three) times daily.        Marland Kitchen lisinopril (PRINIVIL,ZESTRIL) 40 MG tablet Take 40 mg by mouth daily.        . metFORMIN (GLUCOPHAGE) 1000 MG tablet Take 1,000 mg by mouth 2 (two) times daily with a meal.        . metoprolol (TOPROL-XL) 50 MG 24 hr tablet Take 50 mg by mouth 2 (two) times daily.       . nitroGLYCERIN (NITROSTAT) 0.4 MG SL tablet Place 0.4 mg under the tongue every 5 (five) minutes as needed.        . potassium chloride (K-DUR) 10 MEQ tablet Take 3 tablets (30 mEq total) by mouth daily.  270 tablet  3  . predniSONE (DELTASONE) 10 MG tablet Take 10 mg by mouth daily with breakfast.      . tamsulosin (FLOMAX) 0.4 MG CAPS capsule Take 1  capsule (0.4 mg total) by mouth daily.  90 capsule  0  . tiotropium (SPIRIVA) 18 MCG inhalation capsule Place 18 mcg into inhaler and inhale daily.        Marland Kitchen warfarin (COUMADIN) 2 MG tablet Take 4 mg by mouth as directed. 2 tablets       No current facility-administered medications for this visit.    Allergies  Allergen Reactions  . Morphine And Related Other (See Comments)    Hallucinations   . Niacin And Related Dermatitis    Family History  Problem Relation Age of Onset  . Heart disease Maternal Grandmother   . Diabetes Maternal Grandmother   . Cancer Neg Hx   . Stroke Neg Hx   . Heart disease Mother     History   Social History  . Marital Status: Married    Spouse Name: N/A    Number of Children: N/A  . Years of Education: N/A   Occupational History  . Not on file.   Social History Main Topics  . Smoking status: Former Smoker -- 2.00 packs/day for 40 years    Types: Cigarettes  . Smokeless tobacco: Former Systems developer    Quit date: 05/24/1990  . Alcohol Use: No  . Drug Use: No  . Sexual Activity: Not Currently   Other Topics Concern  . Not on file   Social History Narrative  . No narrative on file     Constitutional: Denies fever, malaise, fatigue, headache or abrupt weight changes.  Respiratory: Denies difficulty breathing, shortness of breath, cough or sputum production.   Cardiovascular: Pt reports swelling in the feet, Denies chest pain, chest tightness, palpitations or swelling in the hands.  GU: Pt reports urethral irritation. Denies urgency, frequency, pain with urination, burning sensation, blood in urine, odor or discharge. Neurological: Denies dizziness, difficulty with memory, difficulty with speech or problems with balance and coordination.   No other specific complaints in a complete review of systems (except as listed in HPI above).  Objective:   Physical Exam   BP 112/52  Pulse 77  Temp(Src) 97.6 F (36.4 C) (Oral)  Wt 195 lb (88.451 kg)   SpO2 98%  Wt Readings from Last 3 Encounters:  02/07/14 195 lb (88.451 kg)  01/22/14 196 lb (88.905 kg)  01/14/14 195 lb 8 oz (88.678 kg)    General: Appears his stated age, obese but well developed, well nourished in NAD. Cardiovascular: Normal rate with irregular rhythm. S1,S2 noted.  No murmur, rubs or gallops noted. No JVD. 1+ edema noted. No carotid bruits noted. Pulmonary/Chest: Normal effort and positive vesicular breath sounds. No respiratory distress. No wheezes, rales or ronchi noted.  Neurological: Alert and oriented.   BMET    Component Value Date/Time   NA 138 12/14/2013 1455   K 4.2 12/14/2013 1455   CL 104 12/14/2013 1455   CO2 28 12/14/2013 1455   GLUCOSE 201* 12/14/2013 1455   BUN 21 12/14/2013 1455   CREATININE 1.2 12/14/2013 1455   CALCIUM 9.1 12/14/2013 1455    Lipid Panel     Component Value Date/Time   CHOL 109 06/22/2013 1416   TRIG 192.0* 06/22/2013 1416   HDL 29.80* 06/22/2013 1416   CHOLHDL 4 06/22/2013 1416   VLDL 38.4 06/22/2013 1416   LDLCALC 41 06/22/2013 1416    CBC    Component Value Date/Time   WBC 12.2* 06/22/2013 1416   RBC 4.50 06/22/2013 1416   HGB 12.5* 06/22/2013 1416   HCT 38.1* 06/22/2013 1416   PLT 334.0 06/22/2013 1416   MCV 84.6 06/22/2013 1416   MCHC 32.9 06/22/2013 1416   RDW 17.6* 06/22/2013 1416    Hgb A1C Lab Results  Component Value Date   HGBA1C 7.9* 06/22/2013        Assessment & Plan:   Hospital follow up for urosepsis and CHF exacerbation:  Hospital notes, labs, images reviewed with patient.Tiem to review approx 15 mintues He will finish his course of Cipro tomorrow. We will plan to have him come to the lab in 1 week to repeat urinalysis.  He understands to come sooner if urinary symptoms reemerge. INR has been checked while on Cipro and normal. Will repeat in 4 weeks Will recheck BMET today due to hypokalemia and AKI secondary to urosepsis- will adjust lasix and potassium if needed He follows up with cardiology next week- keep  this appt He follows up with urology re: circumcision- no appt yet but he is waiting on a call from them.

## 2014-02-08 ENCOUNTER — Encounter: Payer: Self-pay | Admitting: Internal Medicine

## 2014-02-11 ENCOUNTER — Ambulatory Visit (INDEPENDENT_AMBULATORY_CARE_PROVIDER_SITE_OTHER): Payer: Medicare Other | Admitting: Cardiovascular Disease

## 2014-02-11 ENCOUNTER — Encounter: Payer: Self-pay | Admitting: Cardiovascular Disease

## 2014-02-11 VITALS — BP 100/58 | HR 74 | Ht 66.0 in | Wt 196.8 lb

## 2014-02-11 DIAGNOSIS — R600 Localized edema: Secondary | ICD-10-CM

## 2014-02-11 DIAGNOSIS — I482 Chronic atrial fibrillation, unspecified: Secondary | ICD-10-CM

## 2014-02-11 DIAGNOSIS — I639 Cerebral infarction, unspecified: Secondary | ICD-10-CM

## 2014-02-11 DIAGNOSIS — R296 Repeated falls: Secondary | ICD-10-CM

## 2014-02-11 DIAGNOSIS — Z01810 Encounter for preprocedural cardiovascular examination: Secondary | ICD-10-CM

## 2014-02-11 DIAGNOSIS — R0602 Shortness of breath: Secondary | ICD-10-CM

## 2014-02-11 DIAGNOSIS — E785 Hyperlipidemia, unspecified: Secondary | ICD-10-CM

## 2014-02-11 DIAGNOSIS — E119 Type 2 diabetes mellitus without complications: Secondary | ICD-10-CM

## 2014-02-11 DIAGNOSIS — R609 Edema, unspecified: Secondary | ICD-10-CM

## 2014-02-11 DIAGNOSIS — I10 Essential (primary) hypertension: Secondary | ICD-10-CM

## 2014-02-11 NOTE — Assessment & Plan Note (Signed)
He reports that he is to have circumcision surgery with urology. He will likely require Lovenox bridging off his Coumadin. Suggested he call us when the surgery has been scheduled. He would be acceptable risk for the surgery

## 2014-02-11 NOTE — Assessment & Plan Note (Signed)
We have encouraged continued exercise, careful diet management in an effort to lose weight. 

## 2014-02-11 NOTE — Assessment & Plan Note (Signed)
Currently with no symptoms of angina. No further workup at this time. Continue current medication regimen. 

## 2014-02-11 NOTE — Patient Instructions (Signed)
You are doing well. No medication changes were made.  Please let us know when you are going to do the circumcision You will need a lovenox bridge  Please add some lasix in the afternoon every other day, Goal weight low 190s, high 180s  Please call us if you have new issues that need to be addressed before your next appt.  Your physician wants you to follow-up in: 1 month.   Your next appointment will be scheduled in our new office located at :  [facility]  [address], [place]  [place], [place] [address]

## 2014-02-11 NOTE — Assessment & Plan Note (Signed)
Encouraged him to stay on his Lipitor daily. Goal LDL less than 70

## 2014-02-11 NOTE — Assessment & Plan Note (Signed)
We have recommended Lovenox bridge when he comes off warfarin for his procedure given his prior history of stroke

## 2014-02-11 NOTE — Assessment & Plan Note (Signed)
Blood pressure is well controlled on today's visit. No changes made to the medications. 

## 2014-02-11 NOTE — Assessment & Plan Note (Signed)
He denies any recent falls on today's visit.

## 2014-02-11 NOTE — Progress Notes (Signed)
Patient ID: Shawn Grace., male    DOB: November 22, 1931, 80 y.o.   MRN: [id number]  HPI Comments: [name] is a pleasant 80 year old gentleman, who has a history of diabetes type 2, coronary artery disease, status post myocardial infarction treated with TPA at [facility] in January 29, 1996.   chronic atrial fibrillation since 2006, on warfarin. Walks with a cane. 4098 had a cva, uncertain if he was on anticoagulation at the time History off falls.  Seen by Dr. Raul Del, pulmonary. Also seen by CHF clinic. Several admissions to the hospital dating back to December 2014 with urine output function, urinary tract infections, status post stent placement by Dr. Elenor Quinones.  In follow-up today, he reports having a recent hospital admission October 17 with discharge October 20 with urosepsis, bladder outflow obstruction. cultures grew [facility] was called to place a Foley catheter. Initial creatinine 1.56, BUN 25.  Echocardiogram 01/25/2014 showing moderate pulmonary hypertension, normal ejection fraction Notes indicate he had antibiotics, diuresis through his hospital course with improvement of his renal function down to creatinine 1.26. Creatinine was checked 2 days ago and was up to 1.4 with BUN 29 He had a Foley catheter for 10 days which was removed He was seen by Dr. Hollice Espy of urology, scheduled for circumcision  In follow-up today, weight is up 189 pounds now up to 195 pounds since he left the hospital. He has worsening leg edema  He fell in January 2015after taking a nap, he fell into his dresser drawer. Significant trauma to his left arm He fell possibly in April 2015the details are unavailable. Did not hurt himself [place] again recently while in the bathroom. Lost his footing, fell backwards, broke the toilet lid, large left flank hematoma  Myoview in March 2007 showed an ejection fraction of 50% with an old scar at the apex and mild peri-infarct ischemia.    echocardiogram in January 2008 showed an ejection fraction at the lower limits of normal with moderate LVH, mild aortic root dilatation, and mild mitral regurgitation.  There was biatrial enlargement.   echocardiogram January 2015 showing ejection fraction 60%, moderately dilated left atrium, mild MR and TR, moderate pulmonary hypertension with right ventricular systolic pressure 53   Previous carotid Dopplers in January of 2008 showed 0-39% stenosis bilaterally. Carotid ultrasound may 2009 showing mild bilateral plaque, no significant stenoses  Total cholesterol 109 EKG shows atrial fibrillation with rate 74 beats per minute, nonspecific ST and T wave abnormality in the anterolateral leads, inferior leads      Outpatient Encounter Prescriptions as of 02/11/2014  Medication Sig  . acetaminophen (TYLENOL) 650 MG CR tablet Take 650 mg by mouth every 8 (eight) hours as needed.    Marland Kitchen albuterol (2.5 MG/3ML) 0.083% NEBU 3 mL, albuterol (5 MG/ML) 0.5% NEBU 0.5 mL Inhale 1 mg into the lungs.  Marland Kitchen albuterol (PROVENTIL HFA;VENTOLIN HFA) 108 (90 BASE) MCG/ACT inhaler Inhale 2 puffs into the lungs every 6 (six) hours as needed.    . ALPRAZolam (XANAX) 0.25 MG tablet Take 1 tablet (0.25 mg total) by mouth at bedtime as needed for anxiety.  Marland Kitchen aspirin 81 MG tablet Take 81 mg by mouth daily.    Marland Kitchen atorvastatin (LIPITOR) 40 MG tablet Take 40 mg by mouth daily.    . bimatoprost (LUMIGAN) 0.03 % ophthalmic solution Place 1 drop into both eyes at bedtime.   . budesonide-formoterol (SYMBICORT) 160-4.5 MCG/ACT inhaler Inhale 2 puffs into the lungs 2 (two) times daily.  Marland Kitchen  ciprofloxacin (CIPRO) 500 MG tablet Take 500 mg by mouth 2 (two) times daily.  . finasteride (PROSCAR) 5 MG tablet Take 5 mg by mouth daily.  . furosemide (LASIX) 40 MG tablet Take 1 tablet (40 mg total) by mouth daily.  Marland Kitchen glimepiride (AMARYL) 4 MG tablet Take 4 mg in the am with 8 mg in the pm.  . isosorbide dinitrate (ISORDIL) 40 MG tablet Take  40 mg by mouth 3 (three) times daily.    Marland Kitchen lisinopril (PRINIVIL,ZESTRIL) 40 MG tablet Take 40 mg by mouth daily.    . metFORMIN (GLUCOPHAGE) 1000 MG tablet Take 1,000 mg by mouth 2 (two) times daily with a meal.    . metoprolol (TOPROL-XL) 50 MG 24 hr tablet Take 50 mg by mouth 2 (two) times daily.   . nitroGLYCERIN (NITROSTAT) 0.4 MG SL tablet Place 0.4 mg under the tongue every 5 (five) minutes as needed.    . potassium chloride (K-DUR) 10 MEQ tablet Take 3 tablets (30 mEq total) by mouth daily.  . predniSONE (DELTASONE) 10 MG tablet Take 10 mg by mouth daily with breakfast.  . tamsulosin (FLOMAX) 0.4 MG CAPS capsule Take 1 capsule (0.4 mg total) by mouth daily.  Marland Kitchen tiotropium (SPIRIVA) 18 MCG inhalation capsule Place 18 mcg into inhaler and inhale daily.    Marland Kitchen warfarin (COUMADIN) 2 MG tablet Take 4 mg by mouth as directed. 2 tablets   Review of Systems  Constitutional: Positive for unexpected weight change.  HENT: Negative.   Eyes: Negative.   Cardiovascular: Positive for leg swelling.  Gastrointestinal: Negative.   Endocrine: Negative.   Musculoskeletal: Positive for gait problem.  Skin: Negative.   Allergic/Immunologic: Negative.   Neurological: Negative.   Hematological: Negative.   Psychiatric/Behavioral: Negative.   All other systems reviewed and are negative.   BP 100/58 mmHg  Pulse 74  Ht 5\' 6"  (1.676 m)  Wt 196 lb 12 oz (89.245 kg)  BMI 31.77 kg/m2  Physical Exam  Constitutional: He is oriented to person, place, and time. He appears well-developed and well-nourished.  HENT:  Head: Normocephalic.  Nose: Nose normal.  Mouth/Throat: Oropharynx is clear and moist.  Eyes: Conjunctivae are normal. Pupils are equal, round, and reactive to light.  Neck: Normal range of motion. Neck supple. No JVD present.  Cardiovascular: Normal rate, regular rhythm, S1 normal, S2 normal, normal heart sounds and intact distal pulses.  Exam reveals no gallop and no friction rub.   No murmur  heard. Pulmonary/Chest: Effort normal and breath sounds normal. No respiratory distress. He has no wheezes. He has no rales. He exhibits no tenderness.  Abdominal: Soft. Bowel sounds are normal. He exhibits no distension. There is no tenderness.  Musculoskeletal: Normal range of motion. He exhibits no edema or tenderness.  Lymphadenopathy:    He has no cervical adenopathy.  Neurological: He is alert and oriented to person, place, and time. Coordination normal.  Skin: Skin is warm and dry. No rash noted. No erythema.  Psychiatric: He has a normal mood and affect. His behavior is normal. Judgment and thought content normal.      Assessment and Plan   Nursing note and vitals reviewed.

## 2014-02-11 NOTE — Assessment & Plan Note (Signed)
Significant lower extremity edema likely a combination of diastolic heart failure with venous insufficiency, unable to exclude some component of lymphedema. Notes indicate low albumin of 2.8 in the hospital which may be contributing. We have recommended that he take Lasix 40 mg in the afternoon every other day in addition to his 40 mg in the morning. Most recent echocardiogram suggests continued fluid overload with moderately elevated right heart pressures.we will need to monitor his renal function closely.

## 2014-02-11 NOTE — Assessment & Plan Note (Signed)
Heart rate relatively well controlled. Tolerating anticoagulation.

## 2014-02-11 NOTE — Addendum Note (Signed)
Addended by: Minna Merritts on: 02/11/2014 01:35 PM   Modules accepted: Level of Service

## 2014-02-14 ENCOUNTER — Other Ambulatory Visit (INDEPENDENT_AMBULATORY_CARE_PROVIDER_SITE_OTHER): Payer: Medicare Other

## 2014-02-14 ENCOUNTER — Other Ambulatory Visit: Payer: Self-pay | Admitting: Internal Medicine

## 2014-02-14 DIAGNOSIS — N390 Urinary tract infection, site not specified: Secondary | ICD-10-CM

## 2014-02-14 LAB — URINALYSIS, ROUTINE W REFLEX MICROSCOPIC
Bilirubin Urine: NEGATIVE
KETONES UR: NEGATIVE
Nitrite: NEGATIVE
SPECIFIC GRAVITY, URINE: 1.025 (ref 1.000–1.030)
URINE GLUCOSE: NEGATIVE
Urobilinogen, UA: 0.2 (ref 0.0–1.0)
pH: 5.5 (ref 5.0–8.0)

## 2014-02-15 ENCOUNTER — Encounter: Payer: Self-pay | Admitting: Family Medicine

## 2014-02-15 ENCOUNTER — Ambulatory Visit (INDEPENDENT_AMBULATORY_CARE_PROVIDER_SITE_OTHER): Payer: Medicare Other | Admitting: Family Medicine

## 2014-02-15 ENCOUNTER — Telehealth: Payer: Self-pay

## 2014-02-15 VITALS — BP 118/76 | HR 78 | Temp 97.6°F | Wt 200.2 lb

## 2014-02-15 DIAGNOSIS — R300 Dysuria: Secondary | ICD-10-CM

## 2014-02-15 DIAGNOSIS — Z87440 Personal history of urinary (tract) infections: Secondary | ICD-10-CM

## 2014-02-15 DIAGNOSIS — E119 Type 2 diabetes mellitus without complications: Secondary | ICD-10-CM

## 2014-02-15 LAB — HEMOGLOBIN A1C: Hgb A1c MFr Bld: 9.6 % — ABNORMAL HIGH (ref 4.6–6.5)

## 2014-02-15 MED ORDER — CIPROFLOXACIN HCL 500 MG PO TABS: 500.0000 mg | ORAL_TABLET | Freq: Two times a day (BID) | ORAL | Status: DC

## 2014-02-15 MED ORDER — FUROSEMIDE 40 MG PO TABS: ORAL_TABLET | ORAL | Status: DC

## 2014-02-15 NOTE — Patient Instructions (Addendum)
Go to the lab on the way out.  We'll contact you with your lab report. Stop glimepiride.  Continue metformin.  Make sure to eat a snack before bed.  Continue to alternate 40mg  and 20mg  of lasix, unless your weight is up.  If so, then temporarily take 40mg  each day.  If you have more burning or fevers, then start the cipro.  If you start the cipro, then cut you coumadin dose in half and notify [facility].

## 2014-02-15 NOTE — Telephone Encounter (Signed)
[name] with [facility] left v/m; pt had missed visit today with [facility].

## 2014-02-15 NOTE — Progress Notes (Signed)
Pre visit review using our clinic review tool, if applicable. No additional management support is needed unless otherwise documented below in the visit note.  Wife had trouble waking him this AM.  He wasn't speaking normally.  Sugar was 35.  After D50 per EMS his sugar was >200 and his mentation returned to normal.  He has a snack last night before bed.  Due for A1c. Compliant with meds.   He has been checking his sugar in the interval 100-200.  He feels "good" now.    He is done with his cipro for uti, no other med changes recently.  Minimal burning with urination, much improved from prev.  No fevers, no chills.    Alternating 40 and 20mg  of lasix and weight had been stable at home ~196 until today.   Meds, vitals, and allergies reviewed.   ROS: See HPI.  Otherwise, noncontributory.  nad ncat Mmm Neck supple no LA ctab IRR, not tachy abd soft 1+ BLE edema Mentation and speech wnl

## 2014-02-17 DIAGNOSIS — Z87440 Personal history of urinary (tract) infections: Secondary | ICD-10-CM | POA: Insufficient documentation

## 2014-02-17 LAB — URINE CULTURE: Colony Count: >=100000

## 2014-02-17 NOTE — Assessment & Plan Note (Signed)
See notes on labs. Will defer to PCP but will need change of meds to lower risk of hypoglycemia.  Stop glimepiride in the meantime. Continue metformin.  Januvia dosed for GFR vs low dose of basal insulin may be useful.  Will ask for PCP input.   Continue snacks qhs, he'll go eat lunch after the OV.   All d/w pt and wife.  He and she agree with plan.  >25 minutes spent in face to face time with patient, >50% spent in counselling or coordination of care.

## 2014-02-17 NOTE — Assessment & Plan Note (Signed)
His ucx likely contaminated but he is clinically improved.  I didn't start cipro now, but if he has more sx then he'll start it and cut his coumadin.  He agrees.

## 2014-02-18 ENCOUNTER — Other Ambulatory Visit: Payer: Self-pay | Admitting: Internal Medicine

## 2014-02-18 MED ORDER — SITAGLIPTIN PHOSPHATE 50 MG PO TABS: 50.0000 mg | ORAL_TABLET | Freq: Every day | ORAL | Status: DC

## 2014-02-19 ENCOUNTER — Telehealth: Payer: Self-pay | Admitting: Internal Medicine

## 2014-02-19 ENCOUNTER — Telehealth: Payer: Self-pay

## 2014-02-19 NOTE — Telephone Encounter (Signed)
Mearl Latin I has spoke to pt and is resolved

## 2014-02-19 NOTE — Telephone Encounter (Signed)
[name] PT with [facility] left v/m requesting cb to pt at [phone] to clarify if Cipro rx should be filled or not. Pt is not sure if should get Cipro rx filled. Spoke with pt and he has AVS telling pt if has more UTI symptoms to start Cipro and cut Coumadin in half and call Webb Silversmith NP. Pt said still has slight burning sensation when urinates and odor is stronger smelling than last week but pt is not sure if symptoms are bad enough to start antibiotic. Pt does not think he has fever. Pt request cb today to advise if should take antibiotic.

## 2014-02-19 NOTE — Telephone Encounter (Signed)
Pt called and requested call back [phone].

## 2014-02-19 NOTE — Telephone Encounter (Signed)
I would hold off antibiotic and continue coumadin at current dose

## 2014-02-19 NOTE — Telephone Encounter (Signed)
Pt notified as instructed and pt voiced understanding. Pt said if he had further questions he would call [facility].

## 2014-02-22 DIAGNOSIS — I5032 Chronic diastolic (congestive) heart failure: Secondary | ICD-10-CM

## 2014-02-25 NOTE — Telephone Encounter (Signed)
Mariel Kansky nurse with [facility] said pt continuing with burning upon urination and same odor; Lattie Haw wants to know should start any med and does pt need f/u appt before 05/2014.Lisa request cb.

## 2014-02-25 NOTE — Telephone Encounter (Signed)
He needs to follow up with urology

## 2014-02-25 NOTE — Telephone Encounter (Signed)
Spoke to [place] and she is aware as instructed--she states she will advise pt

## 2014-02-27 LAB — PROTIME-INR: INR: 3.4 — AB (ref 0.9–1.1)

## 2014-03-01 ENCOUNTER — Telehealth: Payer: Self-pay | Admitting: *Deleted

## 2014-03-01 NOTE — Telephone Encounter (Signed)
I called back.  INR 3.4, taking coumadin 4mg  a day.  Change to 2mg  on Saturdays, 4mg  other days of the weeks.  Recheck INR in 4 weeks.  If PCP prefers recheck sooner, then please notify [facility].  Thanks.

## 2014-03-01 NOTE — Telephone Encounter (Signed)
Ren was unable to take triage call. Lisa from [facility] called and states that pts INR was 3.4. Pt is currently taking 4mg  of coumadin. Lattie Haw is requesting a call back regarding dosing instruction. pls advise

## 2014-03-06 ENCOUNTER — Telehealth: Payer: Self-pay

## 2014-03-06 NOTE — Telephone Encounter (Signed)
Called to confirm dose of coumadin but Dr Damita Dunnings took care of it in [facility] abscence--pt also stated urologist advised pt to use the Cipro Rx he had leftover from last OV in relation to UTI--pt's wife just wanted to let us know he is currently taking per Dr's request

## 2014-03-13 ENCOUNTER — Ambulatory Visit (INDEPENDENT_AMBULATORY_CARE_PROVIDER_SITE_OTHER): Payer: Medicare Other | Admitting: Cardiovascular Disease

## 2014-03-13 ENCOUNTER — Encounter: Payer: Self-pay | Admitting: Cardiovascular Disease

## 2014-03-13 VITALS — BP 120/72 | HR 76 | Ht 66.0 in | Wt 197.0 lb

## 2014-03-13 DIAGNOSIS — Z7189 Other specified counseling: Secondary | ICD-10-CM

## 2014-03-13 DIAGNOSIS — R0602 Shortness of breath: Secondary | ICD-10-CM

## 2014-03-13 DIAGNOSIS — I482 Chronic atrial fibrillation, unspecified: Secondary | ICD-10-CM

## 2014-03-13 DIAGNOSIS — E785 Hyperlipidemia, unspecified: Secondary | ICD-10-CM

## 2014-03-13 DIAGNOSIS — E119 Type 2 diabetes mellitus without complications: Secondary | ICD-10-CM

## 2014-03-13 DIAGNOSIS — I639 Cerebral infarction, unspecified: Secondary | ICD-10-CM

## 2014-03-13 DIAGNOSIS — I10 Essential (primary) hypertension: Secondary | ICD-10-CM

## 2014-03-13 MED ORDER — ENOXAPARIN SODIUM 100 MG/ML ~~LOC~~ SOLN: 100.0000 mg | Freq: Two times a day (BID) | SUBCUTANEOUS | Status: DC

## 2014-03-13 NOTE — Assessment & Plan Note (Signed)
Encouraged him to stay on his Lipitor. Goal LDL less than 70

## 2014-03-13 NOTE — Progress Notes (Signed)
Patient ID: Shawn Grace., male    DOB: November 28, 1931, 80 y.o.   MRN: [id number]  HPI Comments: [name] is a pleasant 80 year old gentleman, who has a history of diabetes type 2, coronary artery disease, status post myocardial infarction treated with TPA at [facility] in January 29, 1996.   chronic atrial fibrillation since 2006, on warfarin. Walks with a cane. 5102 had a cva, uncertain if he was on anticoagulation at the time History off falls.  Seen by Dr. Raul Del, pulmonary. Also seen by CHF clinic. Several admissions to the hospital dating back to December 2014 with urine output function, urinary tract infections, status post stent placement by Dr. Elenor Quinones.  He presents today for follow-up of his atrial fibrillation and for Lovenox bridge discussion  He reports that on 04/16/2013 he is scheduled to have a circumcision done by Dr. Hollice Espy of [facility] He is requesting guidance on a Lovenox bridge He reports that he feels well, denies having any shortness of breath or chest pain with exertion. He continues to have pitting edema Wife has been managing his diuretics, weight is stable between 194 and 196 pounds at home Hemoglobin A1c elevated at 9.6  EKG today shows atrial fibrillation with ventricular rate 76 bpm, poor R-wave progression through the anterior precordial leads  Other past medical history  recent hospital admission October 17 with discharge October 20 with urosepsis, bladder outflow obstruction.cultures grew [facility] was called to place a Foley catheter. Initial creatinine 1.56, BUN 25.  Echocardiogram 01/25/2014 showing moderate pulmonary hypertension, normal ejection fraction Notes indicate he had antibiotics, diuresis through his hospital course with improvement of his renal function down to creatinine 1.26. Creatinine was checked 2 days ago and was up to 1.4 with BUN 29 He had a Foley catheter for 10 days which was  removed  He fell in January 2015after taking a nap, he fell into his dresser drawer. Significant trauma to his left arm He fell possibly in April 2015the details are unavailable. Did not hurt himself [place] again recently while in the bathroom. Lost his footing, fell backwards, broke the toilet lid, large left flank hematoma  Myoview in March 2007 showed an ejection fraction of 50% with an old scar at the apex and mild peri-infarct ischemia.   echocardiogram in January 2008 showed an ejection fraction at the lower limits of normal with moderate LVH, mild aortic root dilatation, and mild mitral regurgitation.  There was biatrial enlargement.   echocardiogram January 2015 showing ejection fraction 60%, moderately dilated left atrium, mild MR and TR, moderate pulmonary hypertension with right ventricular systolic pressure 53   Previous carotid Dopplers in January of 2008 showed 0-39% stenosis bilaterally. Carotid ultrasound may 2009 showing mild bilateral plaque, no significant stenoses  Total cholesterol 109  Outpatient Encounter Prescriptions as of 03/13/2014  Medication Sig  . acetaminophen (TYLENOL) 650 MG CR tablet Take 650 mg by mouth every 8 (eight) hours as needed.    Marland Kitchen albuterol (2.5 MG/3ML) 0.083% NEBU 3 mL, albuterol (5 MG/ML) 0.5% NEBU 0.5 mL Inhale 1 mg into the lungs.  Marland Kitchen albuterol (PROVENTIL HFA;VENTOLIN HFA) 108 (90 BASE) MCG/ACT inhaler Inhale 2 puffs into the lungs every 6 (six) hours as needed.    . ALPRAZolam (XANAX) 0.25 MG tablet Take 1 tablet (0.25 mg total) by mouth at bedtime as needed for anxiety.  Marland Kitchen aspirin 81 MG tablet Take 81 mg by mouth daily.    Marland Kitchen atorvastatin (LIPITOR) 40 MG tablet Take  40 mg by mouth daily.    . bimatoprost (LUMIGAN) 0.03 % ophthalmic solution Place 1 drop into both eyes at bedtime.   . budesonide-formoterol (SYMBICORT) 160-4.5 MCG/ACT inhaler Inhale 2 puffs into the lungs 2 (two) times daily.  . ciprofloxacin (CIPRO) 500 MG tablet Take 1 tablet  (500 mg total) by mouth 2 (two) times daily.  . finasteride (PROSCAR) 5 MG tablet Take 5 mg by mouth daily.  . furosemide (LASIX) 40 MG tablet Alternate 40mg  and 20mg  per day.  If your weight is up, then temporarily resume 40mg  each day.  . isosorbide dinitrate (ISORDIL) 40 MG tablet Take 40 mg by mouth 3 (three) times daily.    Marland Kitchen lisinopril (PRINIVIL,ZESTRIL) 40 MG tablet Take 40 mg by mouth daily.    . metFORMIN (GLUCOPHAGE) 1000 MG tablet Take 1,000 mg by mouth 2 (two) times daily with a meal.    . metoprolol (TOPROL-XL) 50 MG 24 hr tablet Take 50 mg by mouth 2 (two) times daily.   . nitroGLYCERIN (NITROSTAT) 0.4 MG SL tablet Place 0.4 mg under the tongue every 5 (five) minutes as needed.    . nystatin-triamcinolone (MYCOLOG II) cream Apply 1 application topically 2 (two) times daily.   . potassium chloride (K-DUR) 10 MEQ tablet Take 3 tablets (30 mEq total) by mouth daily.  . predniSONE (DELTASONE) 10 MG tablet Take 10 mg by mouth daily with breakfast.  . sitaGLIPtin (JANUVIA) 50 MG tablet Take 1 tablet (50 mg total) by mouth daily.  Marland Kitchen sulfamethoxazole-trimethoprim (BACTRIM DS,SEPTRA DS) 800-160 MG per tablet Take 1 tablet by mouth every 12 (twelve) hours.   . tamsulosin (FLOMAX) 0.4 MG CAPS capsule Take 1 capsule (0.4 mg total) by mouth daily.  Marland Kitchen tiotropium (SPIRIVA) 18 MCG inhalation capsule Place 18 mcg into inhaler and inhale daily.    Marland Kitchen warfarin (COUMADIN) 2 MG tablet Take 4 mg by mouth as directed. 2 tablets   Social history  reports that he has quit smoking. His smoking use included Cigarettes. He has a 80 pack-year smoking history. He quit smokeless tobacco use about 23 years ago. He reports that he does not drink alcohol or use illicit drugs.   Review of Systems  Eyes: Negative.   Respiratory: Negative.   Cardiovascular: Positive for leg swelling.  Gastrointestinal: Negative.   Musculoskeletal: Positive for gait problem.  Neurological: Negative.   Hematological: Negative.    All other systems reviewed and are negative.   BP 120/72 mmHg  Pulse 76  Ht 5\' 6"  (1.676 m)  Wt 197 lb (89.359 kg)  BMI 31.81 kg/m2  Physical Exam  Constitutional: He is oriented to person, place, and time. He appears well-developed and well-nourished.  HENT:  Head: Normocephalic.  Nose: Nose normal.  Mouth/Throat: Oropharynx is clear and moist.  Eyes: Conjunctivae are normal. Pupils are equal, round, and reactive to light.  Neck: Normal range of motion. Neck supple. No JVD present.  Cardiovascular: Normal rate, regular rhythm, S1 normal, S2 normal, normal heart sounds and intact distal pulses.  Exam reveals no gallop and no friction rub.   No murmur heard. Pulmonary/Chest: Effort normal and breath sounds normal. No respiratory distress. He has no wheezes. He has no rales. He exhibits no tenderness.  Abdominal: Soft. Bowel sounds are normal. He exhibits no distension. There is no tenderness.  Musculoskeletal: Normal range of motion. He exhibits no edema or tenderness.  Lymphadenopathy:    He has no cervical adenopathy.  Neurological: He is alert and oriented to person,  place, and time. Coordination normal.  Skin: Skin is warm and dry. No rash noted. No erythema.  Psychiatric: He has a normal mood and affect. His behavior is normal. Judgment and thought content normal.      Assessment and Plan   Nursing note and vitals reviewed.

## 2014-03-13 NOTE — Assessment & Plan Note (Signed)
Blood pressure is well controlled on today's visit. No changes made to the medications. 

## 2014-03-13 NOTE — Assessment & Plan Note (Signed)
Prior stroke. No recent CVA. Given his prior stroke history, we will bridge him with Lovenox for circumcision 04/16/2014

## 2014-03-13 NOTE — Assessment & Plan Note (Addendum)
In preparation for his circumcision on 04/16/2014 , Please stop your coumadin on 04/11/14 Take Lovenox 90 mg twice daily on 1/316 & 04/15/14 Restart coumadin on 04/18/14 Take Lovenox 90 mg twice daily on 04/18/14, 1/8 & 1/9  Please have your coumadin checked w/ Dr. Damita Dunnings the week of 04/22/14

## 2014-03-13 NOTE — Patient Instructions (Addendum)
You are doing well.  Please stop your coumadin on 12/31 Take Lovenox 90 mg twice daily on 1/3 & 1/4 Restart coumadin on 1/7 Take Lovenox 90 mg twice daily on 1/7, 1/8 & 1/9  Please have your coumadin checked w/ Dr. Damita Dunnings the week of 04/22/13  Please call us if you have new issues that need to be addressed before your next appt.  Your physician wants you to follow-up in: 6 months.  You will receive a reminder letter in the mail two months in advance. If you don't receive a letter, please call our office to schedule the follow-up appointment.

## 2014-03-13 NOTE — Assessment & Plan Note (Signed)
Rate well controlled. Tolerating warfarin. No medication changes.

## 2014-03-13 NOTE — Assessment & Plan Note (Signed)
We have encouraged continued exercise, careful diet management in an effort to lose weight. Significant dietary indiscretion recently

## 2014-03-15 ENCOUNTER — Telehealth: Payer: Self-pay

## 2014-03-15 NOTE — Telephone Encounter (Signed)
[name] from Dr Cherrie Gauze office said pt is scheduled 04/16/2014 to have circumcision in office and pt cannot be on any blood thinning med for at least 5 days prior to procedure due to risk of bleeding. [name] request cb. Pt was seen on 03/13/14 and advised when to stop coumadin but was advised to take Lovenox. Please advise.

## 2014-03-15 NOTE — Telephone Encounter (Signed)
[name] from Dr Cherrie Gauze office called back and advised [name] to disregard message earlier; does not need to stop Lovenox.

## 2014-03-19 ENCOUNTER — Other Ambulatory Visit: Payer: Self-pay | Admitting: Internal Medicine

## 2014-03-26 NOTE — Telephone Encounter (Signed)
Pt states the [facility] is no longer taking INR--pt was Tx to front office to schedule appt for 04/01/14 [facility] Coumadin clinic

## 2014-03-26 NOTE — Telephone Encounter (Signed)
He can come to the lab if home health is no longer doing INR. Will [facility] be doing coumadin?

## 2014-03-26 NOTE — Telephone Encounter (Signed)
Mrs Knoth left v/m wanting to know if pt could come to office on 03/27/14 for PT/INR; Mrs Paino request cb. Is [facility] still doing PT/INR?

## 2014-04-01 ENCOUNTER — Ambulatory Visit (INDEPENDENT_AMBULATORY_CARE_PROVIDER_SITE_OTHER): Payer: Medicare Other | Admitting: Family Medicine

## 2014-04-01 DIAGNOSIS — Z5181 Encounter for therapeutic drug level monitoring: Secondary | ICD-10-CM

## 2014-04-01 LAB — POCT INR: INR: 2.5

## 2014-04-02 ENCOUNTER — Telehealth: Payer: Self-pay | Admitting: Cardiovascular Disease

## 2014-04-02 NOTE — Telephone Encounter (Signed)
Left message for [name] to call back.

## 2014-04-02 NOTE — Telephone Encounter (Signed)
[facility] calling have question about a clearance we sent to them.

## 2014-04-11 ENCOUNTER — Ambulatory Visit (INDEPENDENT_AMBULATORY_CARE_PROVIDER_SITE_OTHER): Payer: Medicare Other

## 2014-04-11 DIAGNOSIS — I482 Chronic atrial fibrillation, unspecified: Secondary | ICD-10-CM

## 2014-04-11 NOTE — Progress Notes (Signed)
1.) Reason for visit: Lovenox injection training  2.) Name of MD requesting visit: Dr. Rockey Situ  3.) H&P: Pt is scheduled for circumcision on1/5/16 w/ Dr. Hollice Espy @ [facility]   4.) ROS related to problem: Pt was advised at last ov to    "Please stop your coumadin on 12/31  Take Lovenox 90 mg twice daily on 1/3 & 1/4  Restart coumadin on 1/7  Take Lovenox 90 mg twice daily on 1/7, 1/8 & 1/9"   Pt provided w/ another copy of instructions and calendar was made for ease of reading.   Lovenox injection was explained in detail and pt and his wife practiced w/ empty needle, as pt's 1st dose is due on Sunday.   Pt and wife demonstrate understanding of correct technique and both verbalize understanding.   Advised them that if they feel uncomfortable giving Sunday's injections, they can come in on Monday for me to either give injection or observe their technique.   They are appreciative and will call back w/ any questions or concerns.

## 2014-04-12 HISTORY — PX: CIRCUMCISION: SUR203

## 2014-04-15 ENCOUNTER — Telehealth: Payer: Self-pay | Admitting: *Deleted

## 2014-04-15 ENCOUNTER — Telehealth: Payer: Self-pay

## 2014-04-15 DIAGNOSIS — F419 Anxiety disorder, unspecified: Secondary | ICD-10-CM

## 2014-04-15 MED ORDER — ALPRAZOLAM 0.25 MG PO TABS: 0.2500 mg | ORAL_TABLET | Freq: Every evening | ORAL | Status: DC | PRN

## 2014-04-15 NOTE — Telephone Encounter (Signed)
Per wife lovenox needle is stuck in stomach patient is lying down.  Please call wife with instructions.

## 2014-04-15 NOTE — Telephone Encounter (Signed)
Spoke w/ pt's wife.  She reports that pt laid on couch so that she could give him lovenox shot, but when she administered it, the spring on the syringe shot off and parts of the syringe went all over the couch.  She states that she is fairly certain that the needle is lodged in pt's stomach.  She reports that pt is in the bathroom, as he is constipated.  Advised her to take pt to urgent care or ED to ensure that needle is indeed imbedded in pt and have it removed.  She states that she would prefer to call 911, as EMS can take a look at him.  Advised her to collect the parts of the syringe so that she can take back to the pharmacy.  She verbalizes understanding and will call back if we can be of further assistance.

## 2014-04-15 NOTE — Telephone Encounter (Signed)
Spoke w/ pt's wife.  She reports that pt has been lying on the couch for most of the day.  She initially thought he didn't feel good due to constipation, but on checking, pt has a fever.  She reports that pt is scheduled for circumcision tomorrow and the surgeon will not proceed if pt has an infection.  She is awaiting a call back from PCP. She is concerned, as pt has received 3 injections of Lovenox (2 yesterday and 1 this am) and she is worried about transitioning him back to coumadin if needed.  Advised her to let us know what PCP and surgeon decide and I will make Dr. Rockey Situ aware in the event that surgery is postponed.  She is appreciative and will call back w/ their decision.

## 2014-04-15 NOTE — Telephone Encounter (Signed)
Spoke w/ pt's wife.  She reports that pt inspected syringe and that needle is inside, NOT in his stomach.  She reports that she spring around the syringe flew off, but the needle did not.  Advised pt that some of the needles have a safety mechanism that withdraws the needle. Advised her to contact her pharmacy, let them know this syringe was defective and see about switching to another brand.  She verbalizes understanding and will call back w/ any questions or concerns.

## 2014-04-15 NOTE — Telephone Encounter (Signed)
Mrs.Mineo left v/m after 4:30 pm that pt has surgery scheduled 04/16/14 and today pt has started with fever; pt stopped coumadin on 04/11/14 by instruction from heart doctor. Pt s wife wants to know if urine specimen should be checked. Threasa Beards will contact Webb Silversmith NP.

## 2014-04-15 NOTE — Telephone Encounter (Signed)
Pt left v/m requesting  # [facility]; [facility] that alprazolam request was submitted last week. pt request cb.

## 2014-04-15 NOTE — Telephone Encounter (Signed)
Ok to phone in [facility]anax to [facility]

## 2014-04-16 ENCOUNTER — Telehealth: Payer: Self-pay

## 2014-04-16 NOTE — Telephone Encounter (Signed)
Pt wife called states pt had his surgery and he is doing great.

## 2014-04-17 NOTE — Telephone Encounter (Signed)
Per verbal Leandro Reasoner stated that they should call the Dr in which they had surgery scheduled as they would be able to help better with what to do being that surgery is schedule with them--spoke with wife per HIPAA and she expressed understanding with instructions--I did tell her to call me if she had anymore questions

## 2014-04-17 NOTE — Telephone Encounter (Signed)
Rx faxed to pharmacy  

## 2014-04-19 ENCOUNTER — Ambulatory Visit (INDEPENDENT_AMBULATORY_CARE_PROVIDER_SITE_OTHER): Payer: Medicare Other | Admitting: Family Medicine

## 2014-04-19 DIAGNOSIS — Z5181 Encounter for therapeutic drug level monitoring: Secondary | ICD-10-CM

## 2014-04-19 LAB — POCT INR: INR: 1.3

## 2014-04-19 NOTE — Telephone Encounter (Signed)
Pt wife calling stating that from the surgery that he last night he had some stomach bleeding and they took him off some meds. She just wanted to talk to someone and see what we can do.  Re started coumadin and that happened.    Spoke to nurse and she suggested that patient call [facility] for they do pt coumadin.  Pt understood and would call them.

## 2014-04-20 ENCOUNTER — Inpatient Hospital Stay: Payer: Self-pay | Admitting: Internal Medicine

## 2014-04-20 LAB — URINALYSIS, COMPLETE
BACTERIA: NONE SEEN
Bilirubin,UR: NEGATIVE
Nitrite: NEGATIVE
Ph: 5 (ref 4.5–8.0)
Protein: 30
RBC,UR: 221 /HPF (ref 0–5)
Specific Gravity: 1.021 (ref 1.003–1.030)
Squamous Epithelial: NONE SEEN

## 2014-04-20 LAB — TROPONIN I
TROPONIN-I: 0.02 ng/mL
Troponin-I: <0.02 ng/mL
Troponin-I: <0.02 ng/mL

## 2014-04-20 LAB — CBC WITH DIFFERENTIAL/PLATELET
Basophil #: 0.1 10*3/uL (ref 0.0–0.1)
Basophil %: 0.4 %
EOS ABS: 0 10*3/uL (ref 0.0–0.7)
Eosinophil %: 0.1 %
HCT: 46.2 % (ref 40.0–52.0)
HGB: 14.8 g/dL (ref 13.0–18.0)
LYMPHS ABS: 1 10*3/uL (ref 1.0–3.6)
LYMPHS PCT: 5 %
MCH: 27.1 pg (ref 26.0–34.0)
MCHC: 32 g/dL (ref 32.0–36.0)
MCV: 85 fL (ref 80–100)
MONOS PCT: 6.7 %
Monocyte #: 1.3 x10 3/mm — ABNORMAL HIGH (ref 0.2–1.0)
NEUTROS ABS: 16.7 10*3/uL — AB (ref 1.4–6.5)
Neutrophil %: 87.8 %
PLATELETS: 261 10*3/uL (ref 150–440)
RBC: 5.45 10*6/uL (ref 4.40–5.90)
RDW: 17.1 % — AB (ref 11.5–14.5)
WBC: 19 10*3/uL — AB (ref 3.8–10.6)

## 2014-04-20 LAB — CK TOTAL AND CKMB (NOT AT ARMC)
CK, Total: 23 U/L — ABNORMAL LOW (ref 39–308)
CK, Total: 16 U/L — ABNORMAL LOW (ref 39–308)
CK, Total: 14 U/L — ABNORMAL LOW (ref 39–308)
CK-MB: 0.6 ng/mL (ref 0.5–3.6)
CK-MB: <0.5 ng/mL — ABNORMAL LOW (ref 0.5–3.6)

## 2014-04-20 LAB — MAGNESIUM: MAGNESIUM: 2.1 mg/dL

## 2014-04-20 LAB — BASIC METABOLIC PANEL
ANION GAP: 8 (ref 7–16)
BUN: 29 mg/dL — AB (ref 7–18)
CREATININE: 1.65 mg/dL — AB (ref 0.60–1.30)
Calcium, Total: 9.1 mg/dL (ref 8.5–10.1)
Chloride: 97 mmol/L — ABNORMAL LOW (ref 98–107)
Co2: 28 mmol/L (ref 21–32)
EGFR (African American): 52 — ABNORMAL LOW
GFR CALC NON AF AMER: 43 — AB
GLUCOSE: 419 mg/dL — AB (ref 65–99)
OSMOLALITY: 290 (ref 275–301)
Potassium: 4.8 mmol/L (ref 3.5–5.1)
Sodium: 133 mmol/L — ABNORMAL LOW (ref 136–145)

## 2014-04-20 LAB — PHOSPHORUS: Phosphorus: 3 mg/dL (ref 2.5–4.9)

## 2014-04-20 LAB — LIPASE, BLOOD: LIPASE: 421 U/L — AB (ref 73–393)

## 2014-04-20 LAB — PROTIME-INR
INR: 1.3
Prothrombin Time: 16.4 secs — ABNORMAL HIGH (ref 11.5–14.7)

## 2014-04-21 DIAGNOSIS — I480 Paroxysmal atrial fibrillation: Secondary | ICD-10-CM

## 2014-04-21 DIAGNOSIS — I10 Essential (primary) hypertension: Secondary | ICD-10-CM

## 2014-04-21 DIAGNOSIS — I5033 Acute on chronic diastolic (congestive) heart failure: Secondary | ICD-10-CM

## 2014-04-21 LAB — BASIC METABOLIC PANEL
Anion Gap: 9 (ref 7–16)
BUN: 25 mg/dL — ABNORMAL HIGH (ref 7–18)
CO2: 23 mmol/L (ref 21–32)
CREATININE: 1.46 mg/dL — AB (ref 0.60–1.30)
Calcium, Total: 8 mg/dL — ABNORMAL LOW (ref 8.5–10.1)
Chloride: 105 mmol/L (ref 98–107)
EGFR (Non-African Amer.): 49 — ABNORMAL LOW
GFR CALC AF AMER: 60 — AB
Glucose: 250 mg/dL — ABNORMAL HIGH (ref 65–99)
OSMOLALITY: 287 (ref 275–301)
Potassium: 3.7 mmol/L (ref 3.5–5.1)
Sodium: 137 mmol/L (ref 136–145)

## 2014-04-21 LAB — CBC WITH DIFFERENTIAL/PLATELET
BASOS ABS: 0.1 10*3/uL (ref 0.0–0.1)
Basophil %: 0.5 %
EOS ABS: 0 10*3/uL (ref 0.0–0.7)
Eosinophil %: 0 %
HCT: 43.3 % (ref 40.0–52.0)
HGB: 13.9 g/dL (ref 13.0–18.0)
LYMPHS ABS: 0.6 10*3/uL — AB (ref 1.0–3.6)
LYMPHS PCT: 5.4 %
MCH: 27.1 pg (ref 26.0–34.0)
MCHC: 32.1 g/dL (ref 32.0–36.0)
MCV: 85 fL (ref 80–100)
Monocyte #: 1 x10 3/mm (ref 0.2–1.0)
Monocyte %: 9.5 %
NEUTROS ABS: 9.2 10*3/uL — AB (ref 1.4–6.5)
NEUTROS PCT: 84.6 %
Platelet: 213 10*3/uL (ref 150–440)
RBC: 5.12 10*6/uL (ref 4.40–5.90)
RDW: 17.3 % — ABNORMAL HIGH (ref 11.5–14.5)
WBC: 10.9 10*3/uL — ABNORMAL HIGH (ref 3.8–10.6)

## 2014-04-21 LAB — HEPATIC FUNCTION PANEL A (ARMC)
Albumin: 1.8 g/dL — ABNORMAL LOW (ref 3.4–5.0)
Alkaline Phosphatase: 69 U/L
BILIRUBIN DIRECT: 0.1 mg/dL (ref 0.0–0.2)
Bilirubin,Total: 0.4 mg/dL (ref 0.2–1.0)
SGOT(AST): 18 U/L (ref 15–37)
SGPT (ALT): 10 U/L — ABNORMAL LOW
TOTAL PROTEIN: 6.2 g/dL — AB (ref 6.4–8.2)

## 2014-04-21 LAB — PROTIME-INR
INR: 1.6
Prothrombin Time: 18.4 secs — ABNORMAL HIGH (ref 11.5–14.7)

## 2014-04-22 ENCOUNTER — Ambulatory Visit: Payer: Self-pay

## 2014-04-22 LAB — PROTEIN, URINE, RANDOM: Protein, Random Urine: 69 mg/dL — ABNORMAL HIGH (ref 0–12)

## 2014-04-22 LAB — CREATININE, SERUM
Creatinine: 1.71 mg/dL — ABNORMAL HIGH (ref 0.60–1.30)
GFR CALC AF AMER: 50 — AB
GFR CALC NON AF AMER: 41 — AB

## 2014-04-22 LAB — CREATININE, URINE, RANDOM: CREATININE, URINE RANDOM: 62.4 mg/dL (ref 30.0–125.0)

## 2014-04-23 LAB — BASIC METABOLIC PANEL
Anion Gap: 6 — ABNORMAL LOW (ref 7–16)
BUN: 23 mg/dL — ABNORMAL HIGH (ref 7–18)
CALCIUM: 7.9 mg/dL — AB (ref 8.5–10.1)
CREATININE: 1.87 mg/dL — AB (ref 0.60–1.30)
Chloride: 99 mmol/L (ref 98–107)
Co2: 27 mmol/L (ref 21–32)
EGFR (African American): 45 — ABNORMAL LOW
EGFR (Non-African Amer.): 37 — ABNORMAL LOW
GLUCOSE: 236 mg/dL — AB (ref 65–99)
Osmolality: 276 (ref 275–301)
Potassium: 3.6 mmol/L (ref 3.5–5.1)
Sodium: 132 mmol/L — ABNORMAL LOW (ref 136–145)

## 2014-04-23 LAB — HEMOGLOBIN A1C: Hemoglobin A1C: 12.1 % — ABNORMAL HIGH (ref 4.2–6.3)

## 2014-04-23 LAB — PROTIME-INR
INR: 1.4
Prothrombin Time: 17.3 secs — ABNORMAL HIGH (ref 11.5–14.7)

## 2014-04-23 NOTE — Telephone Encounter (Signed)
Synetta Shadow pharmacist with [facility] said [place] stipulation that NP can only order 30 day refill for controlled substance. Synetta Shadow request cb [phone] with ref # [id number] to receive change in quantity from # 90 to # 30. Or MD can send xanax rx refill for # 90.Please advise.

## 2014-04-23 NOTE — Telephone Encounter (Signed)
Change back to monthly

## 2014-04-24 LAB — BASIC METABOLIC PANEL
Anion Gap: 7 (ref 7–16)
BUN: 27 mg/dL — ABNORMAL HIGH (ref 7–18)
CALCIUM: 7.6 mg/dL — AB (ref 8.5–10.1)
CHLORIDE: 100 mmol/L (ref 98–107)
Co2: 24 mmol/L (ref 21–32)
Creatinine: 1.94 mg/dL — ABNORMAL HIGH (ref 0.60–1.30)
EGFR (Non-African Amer.): 36 — ABNORMAL LOW
GFR CALC AF AMER: 43 — AB
Glucose: 236 mg/dL — ABNORMAL HIGH (ref 65–99)
OSMOLALITY: 275 (ref 275–301)
Potassium: 3.7 mmol/L (ref 3.5–5.1)
Sodium: 131 mmol/L — ABNORMAL LOW (ref 136–145)

## 2014-04-24 LAB — CBC WITH DIFFERENTIAL/PLATELET
Basophil #: 0 10*3/uL (ref 0.0–0.1)
Basophil %: 0.3 %
Eosinophil #: 0.1 10*3/uL (ref 0.0–0.7)
Eosinophil %: 0.9 %
HCT: 35.6 % — ABNORMAL LOW (ref 40.0–52.0)
HGB: 11.5 g/dL — ABNORMAL LOW (ref 13.0–18.0)
LYMPHS PCT: 9.8 %
Lymphocyte #: 1.1 10*3/uL (ref 1.0–3.6)
MCH: 27.4 pg (ref 26.0–34.0)
MCHC: 32.3 g/dL (ref 32.0–36.0)
MCV: 85 fL (ref 80–100)
MONOS PCT: 8.6 %
Monocyte #: 1 x10 3/mm (ref 0.2–1.0)
NEUTROS ABS: 9 10*3/uL — AB (ref 1.4–6.5)
Neutrophil %: 80.4 %
Platelet: 178 10*3/uL (ref 150–440)
RBC: 4.19 10*6/uL — ABNORMAL LOW (ref 4.40–5.90)
RDW: 17.3 % — ABNORMAL HIGH (ref 11.5–14.5)
WBC: 11.1 10*3/uL — ABNORMAL HIGH (ref 3.8–10.6)

## 2014-04-24 LAB — PROTIME-INR
INR: 1.6
Prothrombin Time: 18.6 s — ABNORMAL HIGH

## 2014-04-24 LAB — URINE CULTURE

## 2014-04-24 NOTE — Telephone Encounter (Signed)
[facility]ed [facility] and changed to #30 with 2 refills

## 2014-04-25 ENCOUNTER — Encounter: Payer: Self-pay | Admitting: Internal Medicine

## 2014-04-25 ENCOUNTER — Encounter: Payer: Self-pay | Admitting: Physician Assistant

## 2014-04-25 LAB — CBC WITH DIFFERENTIAL/PLATELET
Basophil #: 0 10*3/uL (ref 0.0–0.1)
Basophil %: 0.1 %
EOS PCT: 0.8 %
Eosinophil #: 0.1 10*3/uL (ref 0.0–0.7)
HCT: 33.2 % — ABNORMAL LOW (ref 40.0–52.0)
HGB: 10.9 g/dL — ABNORMAL LOW (ref 13.0–18.0)
LYMPHS ABS: 1 10*3/uL (ref 1.0–3.6)
Lymphocyte %: 11.2 %
MCH: 27.7 pg (ref 26.0–34.0)
MCHC: 32.8 g/dL (ref 32.0–36.0)
MCV: 84 fL (ref 80–100)
MONO ABS: 0.8 x10 3/mm (ref 0.2–1.0)
Monocyte %: 8.7 %
Neutrophil #: 7.2 10*3/uL — ABNORMAL HIGH (ref 1.4–6.5)
Neutrophil %: 79.2 %
PLATELETS: 205 10*3/uL (ref 150–440)
RBC: 3.93 10*6/uL — ABNORMAL LOW (ref 4.40–5.90)
RDW: 17 % — ABNORMAL HIGH (ref 11.5–14.5)
WBC: 9 10*3/uL (ref 3.8–10.6)

## 2014-04-25 LAB — BASIC METABOLIC PANEL
Anion Gap: 6 — ABNORMAL LOW (ref 7–16)
BUN: 22 mg/dL — ABNORMAL HIGH (ref 7–18)
CALCIUM: 7.9 mg/dL — AB (ref 8.5–10.1)
CO2: 26 mmol/L (ref 21–32)
Chloride: 101 mmol/L (ref 98–107)
Creatinine: 1.69 mg/dL — ABNORMAL HIGH (ref 0.60–1.30)
GFR CALC AF AMER: 51 — AB
GFR CALC NON AF AMER: 42 — AB
Glucose: 371 mg/dL — ABNORMAL HIGH (ref 65–99)
OSMOLALITY: 285 (ref 275–301)
Potassium: 3.7 mmol/L (ref 3.5–5.1)
SODIUM: 133 mmol/L — AB (ref 136–145)

## 2014-04-25 LAB — PROTIME-INR
INR: 1.8
Prothrombin Time: 20.7 secs — ABNORMAL HIGH (ref 11.5–14.7)

## 2014-04-25 LAB — CULTURE, BLOOD (SINGLE)

## 2014-04-25 LAB — WOUND CULTURE

## 2014-04-26 LAB — BASIC METABOLIC PANEL
Anion Gap: 7 (ref 7–16)
BUN: 23 mg/dL — ABNORMAL HIGH (ref 7–18)
CHLORIDE: 101 mmol/L (ref 98–107)
CO2: 28 mmol/L (ref 21–32)
Calcium, Total: 8.1 mg/dL — ABNORMAL LOW (ref 8.5–10.1)
Creatinine: 1.55 mg/dL — ABNORMAL HIGH (ref 0.60–1.30)
EGFR (African American): 56 — ABNORMAL LOW
EGFR (Non-African Amer.): 46 — ABNORMAL LOW
Glucose: 251 mg/dL — ABNORMAL HIGH (ref 65–99)
Osmolality: 284 (ref 275–301)
Potassium: 3.5 mmol/L (ref 3.5–5.1)
SODIUM: 136 mmol/L (ref 136–145)

## 2014-04-26 LAB — PROTIME-INR
INR: 2.2
PROTHROMBIN TIME: 23.8 s — AB (ref 11.5–14.7)

## 2014-04-28 LAB — CULTURE, BLOOD (SINGLE)

## 2014-04-29 ENCOUNTER — Telehealth: Payer: Self-pay | Admitting: Internal Medicine

## 2014-04-29 ENCOUNTER — Other Ambulatory Visit: Payer: Self-pay | Admitting: Internal Medicine

## 2014-04-29 DIAGNOSIS — B49 Unspecified mycosis: Secondary | ICD-10-CM

## 2014-04-29 DIAGNOSIS — A419 Sepsis, unspecified organism: Secondary | ICD-10-CM

## 2014-04-29 LAB — PROTIME-INR
INR: 2.2
Prothrombin Time: 23.8 secs — ABNORMAL HIGH (ref 11.5–14.7)

## 2014-04-29 NOTE — Telephone Encounter (Signed)
Estill Bamberg from [facility] called and requested a referral to Infectious Disease,  Dr. Ola Spurr at [facility] per discharge instructions from [facility] 04/26/14.  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  She states pt needs to be scheduled quickly in 1 week with Dr. Ola Spurr, so maybe mark as urgent? So Rosaria Ferries / Ebony Hail will address more quickly.  Thank you.  Best number to call Estill Bamberg to schedule is [phone] / lt

## 2014-04-29 NOTE — Telephone Encounter (Signed)
Referral placed.

## 2014-04-30 ENCOUNTER — Telehealth: Payer: Self-pay

## 2014-04-30 NOTE — Telephone Encounter (Signed)
Done

## 2014-04-30 NOTE — Telephone Encounter (Signed)
Pt's wife called and stated that pt has been at [facility] and has not had Metformin or Januvia x 5 days because medication was not on current list. Per Nurse taking care of him they need a call from PCP with current medications as directed--Shirley is worried as his sugars have been as high as 500+--phone number to  [facility] [phone] and ask to speak to nurse taking care of pt--

## 2014-04-30 NOTE — Telephone Encounter (Signed)
Attempted to contact pt regarding discharge from [facility] on 04/26/14. Left detailed message asking pt to call back w/ any questions or concerns about meds or discharge instructions. Advised him of appt w/ Christell Faith, PA on 05/07/14 @ 1:45. Asked him to call back if unable to keep this appt.

## 2014-05-02 ENCOUNTER — Inpatient Hospital Stay: Payer: Self-pay | Admitting: Internal Medicine

## 2014-05-02 LAB — COMPREHENSIVE METABOLIC PANEL
ALBUMIN: 2.4 g/dL — AB (ref 3.4–5.0)
Alkaline Phosphatase: 111 U/L
Anion Gap: 9 (ref 7–16)
BUN: 26 mg/dL — ABNORMAL HIGH (ref 7–18)
Bilirubin,Total: 0.3 mg/dL (ref 0.2–1.0)
CREATININE: 1.76 mg/dL — AB (ref 0.60–1.30)
Calcium, Total: 8.5 mg/dL (ref 8.5–10.1)
Chloride: 97 mmol/L — ABNORMAL LOW (ref 98–107)
Co2: 27 mmol/L (ref 21–32)
EGFR (African American): 48 — ABNORMAL LOW
EGFR (Non-African Amer.): 40 — ABNORMAL LOW
Glucose: 447 mg/dL — ABNORMAL HIGH (ref 65–99)
OSMOLALITY: 290 (ref 275–301)
POTASSIUM: 4.3 mmol/L (ref 3.5–5.1)
SGOT(AST): 6 U/L — ABNORMAL LOW (ref 15–37)
SGPT (ALT): 22 U/L
SODIUM: 133 mmol/L — AB (ref 136–145)
TOTAL PROTEIN: 7 g/dL (ref 6.4–8.2)

## 2014-05-02 LAB — URINALYSIS, COMPLETE
BILIRUBIN, UR: NEGATIVE
Bacteria: NONE SEEN
Glucose,UR: >=500 mg/dL (ref 0–75)
KETONE: NEGATIVE
Nitrite: NEGATIVE
PH: 7 (ref 4.5–8.0)
Protein: NEGATIVE
SQUAMOUS EPITHELIAL: NONE SEEN
Specific Gravity: 1.01 (ref 1.003–1.030)
WBC UR: 60 /HPF (ref 0–5)

## 2014-05-02 LAB — CBC
HCT: 36.9 % — ABNORMAL LOW (ref 40.0–52.0)
HGB: 12.1 g/dL — AB (ref 13.0–18.0)
MCH: 27.5 pg (ref 26.0–34.0)
MCHC: 32.7 g/dL (ref 32.0–36.0)
MCV: 84 fL (ref 80–100)
Platelet: 453 10*3/uL — ABNORMAL HIGH (ref 150–440)
RBC: 4.4 10*6/uL (ref 4.40–5.90)
RDW: 17.4 % — ABNORMAL HIGH (ref 11.5–14.5)
WBC: 12.4 10*3/uL — ABNORMAL HIGH (ref 3.8–10.6)

## 2014-05-02 LAB — CK TOTAL AND CKMB (NOT AT ARMC)
CK, Total: 17 U/L — ABNORMAL LOW (ref 39–308)
CK-MB: 0.6 ng/mL (ref 0.5–3.6)

## 2014-05-02 LAB — TROPONIN I: Troponin-I: <0.02 ng/mL

## 2014-05-02 LAB — APTT: Activated PTT: 31.9 secs (ref 23.6–35.9)

## 2014-05-02 LAB — PROTIME-INR
INR: 2.7
Prothrombin Time: 28.3 secs — ABNORMAL HIGH (ref 11.5–14.7)

## 2014-05-03 DIAGNOSIS — I517 Cardiomegaly: Secondary | ICD-10-CM

## 2014-05-03 LAB — CBC WITH DIFFERENTIAL/PLATELET
BASOS ABS: 0 10*3/uL (ref 0.0–0.1)
Basophil %: 0.4 %
EOS PCT: 1.3 %
Eosinophil #: 0.2 10*3/uL (ref 0.0–0.7)
HCT: 34.9 % — ABNORMAL LOW (ref 40.0–52.0)
HGB: 11.3 g/dL — ABNORMAL LOW (ref 13.0–18.0)
LYMPHS PCT: 17.6 %
Lymphocyte #: 2.2 10*3/uL (ref 1.0–3.6)
MCH: 27.2 pg (ref 26.0–34.0)
MCHC: 32.3 g/dL (ref 32.0–36.0)
MCV: 84 fL (ref 80–100)
Monocyte #: 1.2 x10 3/mm — ABNORMAL HIGH (ref 0.2–1.0)
Monocyte %: 9.6 %
Neutrophil #: 8.7 10*3/uL — ABNORMAL HIGH (ref 1.4–6.5)
Neutrophil %: 71.1 %
PLATELETS: 401 10*3/uL (ref 150–440)
RBC: 4.14 10*6/uL — AB (ref 4.40–5.90)
RDW: 17.4 % — AB (ref 11.5–14.5)
WBC: 12.3 10*3/uL — AB (ref 3.8–10.6)

## 2014-05-03 LAB — BASIC METABOLIC PANEL
Anion Gap: 6 — ABNORMAL LOW (ref 7–16)
BUN: 23 mg/dL — AB (ref 7–18)
Calcium, Total: 8.6 mg/dL (ref 8.5–10.1)
Chloride: 102 mmol/L (ref 98–107)
Co2: 29 mmol/L (ref 21–32)
Creatinine: 1.31 mg/dL — ABNORMAL HIGH (ref 0.60–1.30)
EGFR (African American): >60
EGFR (Non-African Amer.): 56 — ABNORMAL LOW
Glucose: 211 mg/dL — ABNORMAL HIGH (ref 65–99)
Osmolality: 284 (ref 275–301)
POTASSIUM: 3.8 mmol/L (ref 3.5–5.1)
Sodium: 137 mmol/L (ref 136–145)

## 2014-05-04 LAB — CBC WITH DIFFERENTIAL/PLATELET
BASOS ABS: 0.1 10*3/uL (ref 0.0–0.1)
BASOS PCT: 0.4 %
EOS ABS: 0.1 10*3/uL (ref 0.0–0.7)
Eosinophil %: 0.7 %
HCT: 36.3 % — ABNORMAL LOW (ref 40.0–52.0)
HGB: 11.6 g/dL — ABNORMAL LOW (ref 13.0–18.0)
LYMPHS PCT: 16.4 %
Lymphocyte #: 2.3 10*3/uL (ref 1.0–3.6)
MCH: 26.9 pg (ref 26.0–34.0)
MCHC: 32 g/dL (ref 32.0–36.0)
MCV: 84 fL (ref 80–100)
Monocyte #: 1.1 x10 3/mm — ABNORMAL HIGH (ref 0.2–1.0)
Monocyte %: 7.7 %
NEUTROS PCT: 74.8 %
Neutrophil #: 10.5 10*3/uL — ABNORMAL HIGH (ref 1.4–6.5)
PLATELETS: 375 10*3/uL (ref 150–440)
RBC: 4.33 10*6/uL — ABNORMAL LOW (ref 4.40–5.90)
RDW: 17.5 % — ABNORMAL HIGH (ref 11.5–14.5)
WBC: 14 10*3/uL — ABNORMAL HIGH (ref 3.8–10.6)

## 2014-05-04 LAB — PROTIME-INR
INR: 2.7
PROTHROMBIN TIME: 28.2 s — AB (ref 11.5–14.7)

## 2014-05-05 LAB — CBC WITH DIFFERENTIAL/PLATELET
BASOS ABS: 0.1 10*3/uL (ref 0.0–0.1)
Basophil %: 0.5 %
Eosinophil #: 0.1 10*3/uL (ref 0.0–0.7)
Eosinophil %: 1 %
HCT: 35.8 % — ABNORMAL LOW (ref 40.0–52.0)
HGB: 11.5 g/dL — ABNORMAL LOW (ref 13.0–18.0)
LYMPHS ABS: 2.5 10*3/uL (ref 1.0–3.6)
Lymphocyte %: 16.9 %
MCH: 27.3 pg (ref 26.0–34.0)
MCHC: 32.3 g/dL (ref 32.0–36.0)
MCV: 85 fL (ref 80–100)
MONO ABS: 1.1 x10 3/mm — AB (ref 0.2–1.0)
Monocyte %: 7.6 %
Neutrophil #: 10.9 10*3/uL — ABNORMAL HIGH (ref 1.4–6.5)
Neutrophil %: 74 %
PLATELETS: 353 10*3/uL (ref 150–440)
RBC: 4.23 10*6/uL — ABNORMAL LOW (ref 4.40–5.90)
RDW: 17.3 % — AB (ref 11.5–14.5)
WBC: 14.7 10*3/uL — ABNORMAL HIGH (ref 3.8–10.6)

## 2014-05-05 LAB — URINE CULTURE

## 2014-05-05 LAB — PROTIME-INR
INR: 2.9
Prothrombin Time: 29.5 secs — ABNORMAL HIGH (ref 11.5–14.7)

## 2014-05-06 ENCOUNTER — Ambulatory Visit: Admit: 2014-05-06 | Disposition: A | Discharge status: Home / Self Care | Payer: Self-pay | Admitting: Family

## 2014-05-06 LAB — PROTIME-INR
INR: 3
Prothrombin Time: 30.5 secs — ABNORMAL HIGH (ref 11.5–14.7)

## 2014-05-06 LAB — CBC WITH DIFFERENTIAL/PLATELET
Basophil #: 0 10*3/uL (ref 0.0–0.1)
Basophil %: 0.3 %
Eosinophil #: 0.2 10*3/uL (ref 0.0–0.7)
Eosinophil %: 1.1 %
HCT: 36.1 % — ABNORMAL LOW (ref 40.0–52.0)
HGB: 11.6 g/dL — AB (ref 13.0–18.0)
LYMPHS PCT: 16.9 %
Lymphocyte #: 2.3 10*3/uL (ref 1.0–3.6)
MCH: 27.4 pg (ref 26.0–34.0)
MCHC: 32.3 g/dL (ref 32.0–36.0)
MCV: 85 fL (ref 80–100)
Monocyte #: 1.1 x10 3/mm — ABNORMAL HIGH (ref 0.2–1.0)
Monocyte %: 7.9 %
NEUTROS ABS: 9.8 10*3/uL — AB (ref 1.4–6.5)
Neutrophil %: 73.8 %
Platelet: 315 10*3/uL (ref 150–440)
RBC: 4.25 10*6/uL — ABNORMAL LOW (ref 4.40–5.90)
RDW: 17.7 % — ABNORMAL HIGH (ref 11.5–14.5)
WBC: 13.3 10*3/uL — ABNORMAL HIGH (ref 3.8–10.6)

## 2014-05-06 LAB — BASIC METABOLIC PANEL
ANION GAP: 6 — AB (ref 7–16)
BUN: 25 mg/dL — AB (ref 7–18)
CHLORIDE: 101 mmol/L (ref 98–107)
CREATININE: 1.23 mg/dL (ref 0.60–1.30)
Calcium, Total: 8.5 mg/dL (ref 8.5–10.1)
Co2: 28 mmol/L (ref 21–32)
EGFR (African American): >60
EGFR (Non-African Amer.): >60
GLUCOSE: 179 mg/dL — AB (ref 65–99)
Osmolality: 279 (ref 275–301)
Potassium: 4.2 mmol/L (ref 3.5–5.1)
Sodium: 135 mmol/L — ABNORMAL LOW (ref 136–145)

## 2014-05-07 ENCOUNTER — Encounter: Payer: Medicare Other | Admitting: Physician Assistant

## 2014-05-08 ENCOUNTER — Telehealth: Payer: Self-pay | Admitting: Internal Medicine

## 2014-05-08 NOTE — Telephone Encounter (Signed)
Pt has f/u appt for 05/14/14--pt's wife shirley says he has been doing good today for the exception of his glucose readings--will request records from [facility]

## 2014-05-08 NOTE — Telephone Encounter (Signed)
PLEASE NOTE: All timestamps contained within this report are represented as [place] Standard Time. CONFIDENTIALTY NOTICE: This fax transmission is intended only for the addressee. It contains information that is legally privileged, confidential or otherwise protected from use or disclosure. If you are not the intended recipient, you are strictly prohibited from reviewing, disclosing, copying using or disseminating any of this information or taking any action in reliance on or regarding this information. If you have received this fax in error, please notify us immediately by telephone so that we can arrange for its return to [place]. Phone: [phone], Toll-Free: [phone], Fax: [phone] Page: 1 of 2 Call Id: [id number] [place] Patient Name: [name] Gender: Male DOB: 1932/02/17 Age: 81 Y 43 M 23 D Return Phone Number: [phone] (Primary), [phone] (Secondary) Address: [place] City/State/Zip: [place] [address] Client [place] - Client Client Site [place] [place] Type Call Call Type Triage / [place] Name [name] Relationship To Patient Spouse Appointment Disposition EMR Appointment Not Necessary Return Phone Number [phone] (Primary) Chief Complaint Blood Sugar High Initial Comment caller states husband's BS is 282 fasting PreDisposition Call Doctor Info pasted into Epic Yes Nurse Assessment Nurse: Mallie Mussel, RN, Alveta Heimlich Date/Time Eilene Ghazi Time): 05/08/2014 8:52:37 AM Confirm and document reason for call. If symptomatic, describe symptoms. ---Caller states that her husband is a type II diabetic. He was just discharged from the hospital on Monday. He has been borderline diabetic for years. They took him off of his Metformin and he takes Januvia of the mornings, 15 units of Levemir  insulin. His fasting blood sugar this morning was 282 about 2 hours ago. Current blood sugar reading is 237. Denies vomiting this morning. He has visual hallucinations, sees ants crawling on the floor. Home health has not been out to see him since he was discharged. Has the patient traveled out of the country within the last 30 days? ---No Does the patient require triage? ---Yes Related visit to physician within the last 2 weeks? ---Yes Does the PT have any chronic conditions? (i.e. diabetes, asthma, etc.) ---Yes List chronic conditions. ---Diabetes, CVA in 99, HTN, CHF Guidelines Guideline Title Affirmed Question Affirmed Notes Nurse Date/Time (Eastern Time) Diabetes - High Blood Sugar Blood glucose 60-240 mg/dl (3.5 -13 mmol/ l) (all triage questions negative) Mallie Mussel, RN, Alveta Heimlich 05/08/2014 9:00:00 AM Disp. Time Eilene Ghazi Time) Disposition Final User 05/08/2014 8:37:13 AM Send To Clinical Follow Up Laroy Apple, [place] PLEASE NOTE: All timestamps contained within this report are represented as [place] Standard Time. CONFIDENTIALTY NOTICE: This fax transmission is intended only for the addressee. It contains information that is legally privileged, confidential or otherwise protected from use or disclosure. If you are not the intended recipient, you are strictly prohibited from reviewing, disclosing, copying using or disseminating any of this information or taking any action in reliance on or regarding this information. If you have received this fax in error, please notify us immediately by telephone so that we can arrange for its return to [place]. Phone: [phone], Toll-Free: [phone], Fax: [phone] Page: 2 of 2 Call Id: [id number] 05/08/2014 9:06:25 AM Home Care Yes Mallie Mussel, RN, Ola Spurr Understands: Yes Disagree/Comply: Comply Care Advice Given Per Guideline HOME CARE: You should be able to treat this at home. * Your blood sugar continues to get above 240 mg/dl (13 mmol/l). *  Your blood sugar continues  to be higher than the glucose goals your doctor set for you.It has been longer than 6 months since you had an Hemoglobin A1C test. * Glucose over 300 mg/dL (16.5 mmol/l) two or more times in a row * You become worse. After Care Instructions Given Call Event Type User Date / Time Description

## 2014-05-08 NOTE — Telephone Encounter (Signed)
He needs to make a hospital follow up with me- 30 minute appt

## 2014-05-08 NOTE — Telephone Encounter (Signed)
Patient Name: [name]  DOB: September 30, 1931    Initial Comment caller states husband's BS is 282 fasting   Nurse Assessment  Nurse: Mallie Mussel, RN, Alveta Heimlich Date/Time Eilene Ghazi Time): 05/08/2014 8:52:37 AM  Confirm and document reason for call. If symptomatic, describe symptoms. ---Caller states that her husband is a type II diabetic. He was just discharged from the hospital on Monday. He has been borderline diabetic for years. They took him off of his Metformin and he takes Januvia of the mornings, 15 units of Levemir insulin. His fasting blood sugar this morning was 282 about 2 hours ago. Current blood sugar reading is 237. Denies vomiting this morning. He has visual hallucinations, sees ants crawling on the floor. Home health has not been out to see him since he was discharged.  Has the patient traveled out of the country within the last 30 days? ---No  Does the patient require triage? ---Yes  Related visit to physician within the last 2 weeks? ---Yes  Does the PT have any chronic conditions? (i.e. diabetes, asthma, etc.) ---Yes  List chronic conditions. ---Diabetes, CVA in 80, HTN, CHF     Guidelines    Guideline Title Affirmed Question Affirmed Notes  Diabetes - High Blood Sugar Blood glucose 60-240 mg/dl (3.5 -13 mmol/l) (all triage questions negative)    Final Disposition User   [place], RN, Alveta Heimlich

## 2014-05-10 ENCOUNTER — Telehealth: Payer: Self-pay | Admitting: *Deleted

## 2014-05-10 NOTE — Telephone Encounter (Signed)
I need to review his hospital records first in order to make see what he should be taking.

## 2014-05-10 NOTE — Telephone Encounter (Signed)
[name] said [facility] nurse said pt should not be taking Januvia; Home health nurse stopped Januvia. [facility] nurse said pt should be taking less of isosorbide and diltiazem. [name] said would discuss with Webb Silversmith NP at 05/14/14 appt.

## 2014-05-10 NOTE — Telephone Encounter (Signed)
[facility] nurse Freda Munro) says [name] was discharged from [facility] and 1) was not told whether or not to continue Januvia which he has in the home. 2) Discharge Summary shows:  Diltiazem 120 mg daily and Doriconazole 200 mg every 12 hours x 10 days, both of which he does not have in the home. 3) There is a question as to what dose of the medications he should be taking:  Furosemide 20 or 40 mg  Lisinopril 10 or 40 mg  Metoprolol 12.5 or 50 mg

## 2014-05-13 ENCOUNTER — Telehealth: Payer: Self-pay | Admitting: *Deleted

## 2014-05-13 ENCOUNTER — Encounter: Payer: Self-pay | Admitting: Internal Medicine

## 2014-05-13 ENCOUNTER — Other Ambulatory Visit: Payer: Medicare Other

## 2014-05-13 NOTE — Telephone Encounter (Signed)
PLEASE NOTE: All timestamps contained within this report are represented as [place] Standard Time. CONFIDENTIALTY NOTICE: This fax transmission is intended only for the addressee. It contains information that is legally privileged, confidential or otherwise protected from use or disclosure. If you are not the intended recipient, you are strictly prohibited from reviewing, disclosing, copying using or disseminating any of this information or taking any action in reliance on or regarding this information. If you have received this fax in error, please notify us immediately by telephone so that we can arrange for its return to [place]. Phone: [phone], Toll-Free: [phone], Fax: [phone] Page: 1 of 1 Call Id: [id number] [place] Patient Name: [name] Gender: Male DOB: February 28, 1932 Age: 26 Y 44 M 26 D Return Phone Number: Address: City/State/Zip: [place] Client [place] Client Site [place] Physician Webb Silversmith Contact Type Call Call Type Page Only Caller Name Fanetta Relationship To Patient Provider Is this call to report lab results? No Return Phone Number Unavailable Initial Comment Caller states she is calling from [place], pt has elevated blood sugar. Ernest Haber [phone] Nurse Assessment Guidelines Guideline Title Affirmed Question Affirmed Notes Nurse Date/Time Eilene Ghazi Time) Disp. Time Eilene Ghazi Time) Disposition Final User 05/11/2014 6:08:06 AM Paged On Call back to [facility] Gae Gallop 05/11/2014 6:26:33 AM Page Completed Yes Zannie Kehr After Care Instructions Given Call Event Type User Date / Time Description Paging DoctorName DoctorPhone DateTime Result/Outcome Notes Ria Bush [phone] 05/11/2014 6:08:06 AM Paged On Call Back to Call Center Please call [place] @  [phone] Ria Bush 05/11/2014 6:26:10 AM Spoke with On Call - General

## 2014-05-14 ENCOUNTER — Ambulatory Visit (INDEPENDENT_AMBULATORY_CARE_PROVIDER_SITE_OTHER): Payer: Medicare Other | Admitting: Internal Medicine

## 2014-05-14 ENCOUNTER — Encounter: Payer: Self-pay | Admitting: Internal Medicine

## 2014-05-14 VITALS — BP 96/58 | HR 80 | Temp 98.0°F | Wt 181.0 lb

## 2014-05-14 DIAGNOSIS — E785 Hyperlipidemia, unspecified: Secondary | ICD-10-CM

## 2014-05-14 DIAGNOSIS — N400 Enlarged prostate without lower urinary tract symptoms: Secondary | ICD-10-CM

## 2014-05-14 DIAGNOSIS — F419 Anxiety disorder, unspecified: Secondary | ICD-10-CM

## 2014-05-14 DIAGNOSIS — N183 Chronic kidney disease, stage 3 unspecified: Secondary | ICD-10-CM | POA: Insufficient documentation

## 2014-05-14 DIAGNOSIS — B49 Unspecified mycosis: Secondary | ICD-10-CM

## 2014-05-14 DIAGNOSIS — I639 Cerebral infarction, unspecified: Secondary | ICD-10-CM

## 2014-05-14 DIAGNOSIS — I10 Essential (primary) hypertension: Secondary | ICD-10-CM

## 2014-05-14 DIAGNOSIS — I482 Chronic atrial fibrillation, unspecified: Secondary | ICD-10-CM

## 2014-05-14 DIAGNOSIS — E1165 Type 2 diabetes mellitus with hyperglycemia: Secondary | ICD-10-CM

## 2014-05-14 DIAGNOSIS — Z5181 Encounter for therapeutic drug level monitoring: Secondary | ICD-10-CM

## 2014-05-14 DIAGNOSIS — J449 Chronic obstructive pulmonary disease, unspecified: Secondary | ICD-10-CM

## 2014-05-14 DIAGNOSIS — E1122 Type 2 diabetes mellitus with diabetic chronic kidney disease: Secondary | ICD-10-CM | POA: Insufficient documentation

## 2014-05-14 DIAGNOSIS — I2510 Atherosclerotic heart disease of native coronary artery without angina pectoris: Secondary | ICD-10-CM

## 2014-05-14 DIAGNOSIS — I5032 Chronic diastolic (congestive) heart failure: Secondary | ICD-10-CM

## 2014-05-14 LAB — POCT INR: INR: 1.5

## 2014-05-14 LAB — CULTURE, BLOOD (SINGLE)

## 2014-05-14 MED ORDER — VORICONAZOLE 200 MG PO TABS: 200.0000 mg | ORAL_TABLET | Freq: Two times a day (BID) | ORAL | Status: DC

## 2014-05-14 MED ORDER — LISINOPRIL 20 MG PO TABS: 20.0000 mg | ORAL_TABLET | Freq: Every day | ORAL | Status: DC

## 2014-05-14 MED ORDER — DILTIAZEM HCL 120 MG PO TABS: 120.0000 mg | ORAL_TABLET | Freq: Four times a day (QID) | ORAL | Status: DC

## 2014-05-14 MED ORDER — SITAGLIPTIN PHOSPHATE 100 MG PO TABS: 100.0000 mg | ORAL_TABLET | Freq: Every day | ORAL | Status: DC

## 2014-05-14 NOTE — Assessment & Plan Note (Signed)
LDL at goal on Lipitor Will check CMET and Lipid profile at next visit

## 2014-05-14 NOTE — Progress Notes (Signed)
Pre visit review using our clinic review tool, if applicable. No additional management support is needed unless otherwise documented below in the visit note. 

## 2014-05-14 NOTE — Assessment & Plan Note (Signed)
Controlled on current regimen of Celexa and Xanax Will continue for now

## 2014-05-14 NOTE — Progress Notes (Signed)
Subjective:    Patient ID: [name]., male    DOB: 1931-12-05, 81 y.o.   MRN: [id number]  HPI  Pt presents to the clinic today for [facility] followup. He went to the ER 05/02/14 for acute confusion, vision changes and right sided weakness. MRI showed multiple embolic strokes, mostly in the left parietal and left occipital area. He was consulted by Neuro. It was felt that his stroke was caused by his chronic afib despite being therapeutic on Coumadin. Echo showed an EF of 60 % with no vegetation on the valves. Carotid ultrasound did snot show any significant stenosis. The neurologist recommend that the pt switch to Eliquis, but would need to discuss this with his cardiologist, Dr. Rockey Situ. Since he has been home, he was been working with PT/OT. He reports the strength and agility is coming back slowly to his right side.  HTN: BP was stable in the hospital. He was discharged home on Lisinopril 40 mg lasix 40 mg with potassium supplement. His BP today is 96/58.  CHF: Compensated on Lasix and Metoprolol. Echo showed EF of 60 %.  DM2: Prior to admission, he was taken off Metformin due to renal insufficiency. He continued to take Januvia daily. His last A1C was 12.1 on 04/23/14. He was started on Levemir 15 units daily in the hospital. He has continued to be hyperglycemic, sugars ranging 230-420.Marland Kitchen His wife reports that there was some confusion as to whether he was to continue the Januvia at home or not. Eye exam yearly. Flu 12/2013. Pneumonia Vaccine 06/2013. Foot exam- he is unsure.  Afib: Rate controlled on Metoprolol 50 mg daily. Cardizem 120 mg  was started during admission, although he did not have a prescription for this when he went home. He continued on Coumadin and was to discuss alternative therapy with cardiology at followup in 1 week. His cardiology follow up is scheduled for Thursday.Marland Kitchen  HLD with CAD: Therapeutic on Coumadin and ASA. LDL 41 on Lipitor. No angina on Isosorbide.  COPD: On low  dose prednisone and Symbicort daily. Has albuterol prn. I wonder if his DM 2 would improve by cutting the Prednisone to 5 mg daily. He will discuss this with his pulmonologist, Dr. Raul Del.  BPH: Voiding ok on Flomax and Proscar. Had recurrent UTI's and Phimosis. Recently underwent circumcision. He was having dysuria in the hospital although urine culture was negative. He was sent home on a 10 day course of Cipro. He will take his last dose tomorrow.  Anxiety: Stable on Celexa and Xanax. He reports he only uses the Xanax a few times per week.  Of note, he has lost 19 pounds in the last 3 months.  Review of Systems      Past Medical History  Diagnosis Date  . MI     a. tx'd w/ TPA at [facility] 01/29/1996  . HYPERTENSION   . HYPERLIPIDEMIA   . CVA 1999  . CAD     a. Myoview 06/2005: EF 50%, scar @ apex, mild peri-infarct ischemia  . Chronic atrial fibrillation     a. since 2006; b. on warfarin  . DM   . History of kidney stones   . Neuropathy of both feet   . Chronic diastolic CHF (congestive heart failure)     a. echo 04/2006: EF lower limits of nl, mod LVH, mild aortic root dilatation, & mild MR, biatrial enlargement; b. echo 04/2013: EF 60%, mod dilated LA, mild MR & TR, mod pulm HTN w/  RV systolic pressure 53, c. echo 04/21/14: EF 55-60%, unable to exclude WMA, severely dilated LA 6.6 cm, nl RVSP, mildly dilated aortic root  . Kidney stone     a. s/p left ureteral stenting 04/24/14    Current Outpatient Prescriptions  Medication Sig Dispense Refill  . acetaminophen (TYLENOL) 650 MG CR tablet Take 650 mg by mouth every 8 (eight) hours as needed.      Marland Kitchen albuterol (2.5 MG/3ML) 0.083% NEBU 3 mL, albuterol (5 MG/ML) 0.5% NEBU 0.5 mL Inhale 1 mg into the lungs.    Marland Kitchen albuterol (PROVENTIL HFA;VENTOLIN HFA) 108 (90 BASE) MCG/ACT inhaler Inhale 2 puffs into the lungs every 6 (six) hours as needed.      . ALPRAZolam (XANAX) 0.25 MG tablet Take 1 tablet (0.25 mg total) by mouth at bedtime as needed  for anxiety. 90 tablet 0  . aspirin 81 MG tablet Take 81 mg by mouth daily.      Marland Kitchen atorvastatin (LIPITOR) 40 MG tablet Take 40 mg by mouth daily.      . bimatoprost (LUMIGAN) 0.03 % ophthalmic solution Place 1 drop into both eyes at bedtime.     . budesonide-formoterol (SYMBICORT) 160-4.5 MCG/ACT inhaler Inhale 2 puffs into the lungs 2 (two) times daily.    . ciprofloxacin (CIPRO) 500 MG tablet Take 1 tablet (500 mg total) by mouth 2 (two) times daily. 14 tablet 0  . enoxaparin (LOVENOX) 100 MG/ML injection Inject 1 mL (100 mg total) into the skin every 12 (twelve) hours. 10 Syringe 0  . finasteride (PROSCAR) 5 MG tablet Take 5 mg by mouth daily.    . furosemide (LASIX) 40 MG tablet Alternate 40mg  and 20mg  per day.  If your weight is up, then temporarily resume 40mg  each day. 90 tablet 3  . isosorbide dinitrate (ISORDIL) 40 MG tablet Take 40 mg by mouth 3 (three) times daily.      Marland Kitchen lisinopril (PRINIVIL,ZESTRIL) 40 MG tablet Take 40 mg by mouth daily.      . metFORMIN (GLUCOPHAGE) 1000 MG tablet Take 1,000 mg by mouth 2 (two) times daily with a meal.      . metoprolol (TOPROL-XL) 50 MG 24 hr tablet Take 50 mg by mouth 2 (two) times daily.     . nitroGLYCERIN (NITROSTAT) 0.4 MG SL tablet Place 0.4 mg under the tongue every 5 (five) minutes as needed.      . nystatin-triamcinolone (MYCOLOG II) cream Apply 1 application topically 2 (two) times daily.     . potassium chloride (K-DUR) 10 MEQ tablet Take 3 tablets (30 mEq total) by mouth daily. 270 tablet 3  . predniSONE (DELTASONE) 10 MG tablet Take 10 mg by mouth daily with breakfast.    . sitaGLIPtin (JANUVIA) 50 MG tablet Take 1 tablet (50 mg total) by mouth daily. 30 tablet 2  . sulfamethoxazole-trimethoprim (BACTRIM DS,SEPTRA DS) 800-160 MG per tablet Take 1 tablet by mouth every 12 (twelve) hours.     . tamsulosin (FLOMAX) 0.4 MG CAPS capsule TAKE 1 CAPSULE DAILY 90 capsule 0  . tiotropium (SPIRIVA) 18 MCG inhalation capsule Place 18 mcg into  inhaler and inhale daily.      Marland Kitchen warfarin (COUMADIN) 2 MG tablet Take 4 mg by mouth as directed. 2 tablets     No current facility-administered medications for this visit.    Allergies  Allergen Reactions  . Morphine And Related Other (See Comments)    Hallucinations   . Niacin And Related Dermatitis  Family History  Problem Relation Age of Onset  . Heart disease Maternal Grandmother   . Diabetes Maternal Grandmother   . Cancer Neg Hx   . Stroke Neg Hx   . Heart disease Mother     History   Social History  . Marital Status: Married    Spouse Name: N/A    Number of Children: N/A  . Years of Education: N/A   Occupational History  . Not on file.   Social History Main Topics  . Smoking status: Former Smoker -- 2.00 packs/day for 40 years    Types: Cigarettes  . Smokeless tobacco: Former Systems developer    Quit date: 05/24/1990  . Alcohol Use: No  . Drug Use: No  . Sexual Activity: Not Currently   Other Topics Concern  . Not on file   Social History Narrative     Constitutional: Pt reports fatigue. Denies fever, malaise, headache or abrupt weight changes.  HEENT: Denies eye pain, eye redness, ear pain, ringing in the ears, wax buildup, runny nose, nasal congestion, bloody nose, or sore throat. Respiratory: Denies difficulty breathing, shortness of breath, cough or sputum production.   Cardiovascular: Denies chest pain, chest tightness, palpitations or swelling in the hands or feet.  Gastrointestinal: Denies abdominal pain, bloating, constipation, diarrhea or blood in the stool.  GU: Pt reports frequency. Denies urgency,  pain with urination, burning sensation, blood in urine, odor or discharge. Musculoskeletal: Pt reports mild right side weakness. Denies decrease in range of motion, muscle pain or joint pain and swelling.  Skin: Denies redness, rashes, lesions or ulcercations.  Neurological: Pt reports mild difficulty with balance and coordination. Denies dizziness,  difficulty with memory, difficulty with speech.  Psych: Pt reports anxiety. Denies depression, SI/HI.   No other specific complaints in a complete review of systems (except as listed in HPI above).  Objective:   Physical Exam   BP 96/58 mmHg  Pulse 80  Temp(Src) 98 F (36.7 C) (Oral)  Wt 181 lb (82.101 kg)  SpO2 97% Wt Readings from Last 3 Encounters:  05/14/14 181 lb (82.101 kg)  03/13/14 197 lb (89.359 kg)  02/15/14 200 lb 4 oz (90.833 kg)    General: Appears his stated age, chronically ill appearing, in NAD. Skin: Warm, dry and intact. Bruising noted to left hand (he reports a curtain rod fell on his hand). HEENT: Head: normal shape and size; Eyes: sclera white, no icterus, conjunctiva pink, PERRLA and EOMs intact; Neck:  Neck supple, trachea midline. No masses, lumps or thyromegaly present.  Cardiovascular: Normal rate with irregular rhythm.  No murmur, rubs or gallops noted. No JVD. 1 + edema noted bilaterally. No carotid bruits noted. Pulmonary/Chest: Normal effort and positive vesicular breath sounds. No respiratory distress. No wheezes, rales or ronchi noted.  Abdomen: Distended but soft and nontender. Normal bowel sounds, no bruits noted. No masses noted. Liver, spleen and kidneys non palpable. Musculoskeletal: Difficult to assess as he is in the wheelchair today. Hand grip 4/5 right, 5/5 left. Neurological: Alert and oriented. Cranial nerves II-XII grossly intact. Psychiatric: Mood and affect normal. Behavior is normal. Judgment and thought content normal.     BMET    Component Value Date/Time   NA 136 02/07/2014 1510   K 4.0 02/07/2014 1510   CL 104 02/07/2014 1510   CO2 26 02/07/2014 1510   GLUCOSE 193* 02/07/2014 1510   BUN 29* 02/07/2014 1510   CREATININE 1.4 02/07/2014 1510   CALCIUM 8.9 02/07/2014 1510  Lipid Panel     Component Value Date/Time   CHOL 109 06/22/2013 1416   TRIG 192.0* 06/22/2013 1416   HDL 29.80* 06/22/2013 1416   CHOLHDL 4  06/22/2013 1416   VLDL 38.4 06/22/2013 1416   LDLCALC 41 06/22/2013 1416    CBC    Component Value Date/Time   WBC 12.2* 06/22/2013 1416   RBC 4.50 06/22/2013 1416   HGB 12.5* 06/22/2013 1416   HCT 38.1* 06/22/2013 1416   PLT 334.0 06/22/2013 1416   MCV 84.6 06/22/2013 1416   MCHC 32.9 06/22/2013 1416   RDW 17.6* 06/22/2013 1416    Hgb A1C Lab Results  Component Value Date   HGBA1C 9.6* 02/15/2014        Assessment & Plan:   Hospital follow up for CVA, left side:  Hospital notes, labs, imaging reviewed with pt Medications thoroughly reviewed and MAR updated Will continue ASA, statin, and coumadin at this time Continue to work with PT/OT He will follow up with cardiology later this week  RTC in 3 months or sooner if needed

## 2014-05-14 NOTE — Assessment & Plan Note (Signed)
LDL controlled on Lipitor No angina on Isosorbide- will continue for now Has Nitro if needed but has not used

## 2014-05-14 NOTE — Assessment & Plan Note (Signed)
Chronic, but rate controlled Per hospital note, he is to discuss changing from Coumadin to Eliquis- will leave this up to cardiology INR today Continue Metoprolol RX provided for Diltiazem 120 mg daily, per hospital d/c records He will follow up with cardiology this thursday

## 2014-05-14 NOTE — Patient Instructions (Signed)
Rehabilitation After a Stroke A stroke can cause many types of problems. The treatment of stroke involves three stages: prevention, treatment immediately following a stroke, and rehabilitation after a stroke. HOW IS MY REHABILITATION PLAN DEVELOPED? A detailed exam by your health care provider helps outline what problems were caused by the stroke. Your health care provider may consult specialists. The specialists may include doctors, occupational and physical therapists, and speech therapists. It is then possible to make a plan that best fits your needs.  Your evaluation might include the following:  Evaluation of your ability to do daily activities that require using muscles, coordination, vision, reasoning, memory, and problem solving. Interviews with you and your health care provider will help determine what you could do and could not do before the stroke.  Evaluation of your ability to do personal self-care tasks, such as dressing, grooming, and eating.  Tests to see if there are sensory and motor changes due to the stroke, especially in the hands and legs. WHAT ARE THE TYPES OF REHABILITATION? Your health care provider may have you start rehabilitation right away depending on the type and severity of your stroke. Rehabilitation after stroke is focused on getting function back and preventing another stroke. Rehabilitation might include:   Physical therapy. This can include help with walking, sitting, lying down, and balance. It may also be designed to help prevent shortening of the muscles (contractures) and swelling (edema).  Occupational therapy. This therapy helps you to relearn skills needed for leading a normal life. These could include eating, using the restroom, dressing, and taking care of yourself. It helps to make you more independent.  Vision therapy. This can help you to retrain, strengthen, and improve your vision after a stroke.  Speech therapy. This can help to improve your  speech and communication skills. It also teaches you and your family members to cope with problems of being unable to communicate.  Cognitive therapy. This therapy can help with problems caused by lack of memory, attention, or concentration.  Psychological or psychiatric therapy. This can help you cope with problems of frustration and emotional problems that may develop after a stroke.  Document Released: 04/18/2007 Document Revised: 08/13/2013 Document Reviewed: 08/31/2012 [facility] Patient Information 2015 [place], [place]. This information is not intended to replace advice given to you by your health care provider. Make sure you discuss any questions you have with your health care provider.

## 2014-05-14 NOTE — Assessment & Plan Note (Signed)
Voids okay on Flomax and Proscar, will continue current therapy for now

## 2014-05-14 NOTE — Assessment & Plan Note (Signed)
Echo reviewed Compensated Doing well on Metoprolol and Lasix, as well as potassium supplement Creatinine and Potassium levels being followed His 19 lb weight loss seems largely related to loss of excess fluid

## 2014-05-14 NOTE — Assessment & Plan Note (Signed)
BP low today Will cut Lisinopril in half to 20 mg daily-New RX provided His wife will monitor BP and call me if less than 90/50 or greater than 140/90

## 2014-05-14 NOTE — Telephone Encounter (Signed)
Pt is here for hospital f/u and to go over meds

## 2014-05-14 NOTE — Assessment & Plan Note (Signed)
Advised him to discuss with Dr. Raul Del about cutting prednisone down to 5 mg daily for improved blood sugar control Continue Symbicort Albuterol prn

## 2014-05-14 NOTE — Assessment & Plan Note (Signed)
Uncontrolled Last A1C 12.2, will repeat in 3 months Continue Januvia Increase Levemir to 18 units daily, continue to check fasting blood sugars. If remain  >200 after 1 week, please call me Foot exam today

## 2014-05-15 ENCOUNTER — Telehealth: Payer: Self-pay

## 2014-05-15 NOTE — Telephone Encounter (Signed)
Pt said [facility] today that [facility] do not fill medication for quantity for # 1. Pt does not know name of med. I looked on med list and do not see quantity of # 1 for any med. Pt's wife requested me to send to [facility] and see if she can figure out what [facility] is talking [facility]. Pt request cb.

## 2014-05-16 ENCOUNTER — Ambulatory Visit (INDEPENDENT_AMBULATORY_CARE_PROVIDER_SITE_OTHER): Payer: Medicare Other | Admitting: Cardiovascular Disease

## 2014-05-16 ENCOUNTER — Encounter: Payer: Self-pay | Admitting: Cardiovascular Disease

## 2014-05-16 VITALS — BP 90/60 | HR 78 | Ht 66.0 in | Wt 185.0 lb

## 2014-05-16 DIAGNOSIS — I639 Cerebral infarction, unspecified: Secondary | ICD-10-CM

## 2014-05-16 DIAGNOSIS — E785 Hyperlipidemia, unspecified: Secondary | ICD-10-CM

## 2014-05-16 DIAGNOSIS — I4891 Unspecified atrial fibrillation: Secondary | ICD-10-CM

## 2014-05-16 DIAGNOSIS — J449 Chronic obstructive pulmonary disease, unspecified: Secondary | ICD-10-CM

## 2014-05-16 DIAGNOSIS — I5032 Chronic diastolic (congestive) heart failure: Secondary | ICD-10-CM

## 2014-05-16 DIAGNOSIS — I2510 Atherosclerotic heart disease of native coronary artery without angina pectoris: Secondary | ICD-10-CM

## 2014-05-16 DIAGNOSIS — I10 Essential (primary) hypertension: Secondary | ICD-10-CM

## 2014-05-16 NOTE — Assessment & Plan Note (Signed)
Blood pressure is low, 80 systolic on my check. He is asymptomatic. We will hold his isosorbide. We have suggested he not take diltiazem if this comes in the mail. He should not be on calcium channel blockers given his lower extremity edema. He was not on this previously. Suggested he monitor his blood pressure at home and call the office if he continues to run low.

## 2014-05-16 NOTE — Assessment & Plan Note (Signed)
Stable COPD symptoms. Encouraged him to stay on his inhalers

## 2014-05-16 NOTE — Patient Instructions (Addendum)
You are doing well.  Please hold the warfarin and aspirin Start eliquis 5 mg twice a day  Please do not take diltiazem if it comes in the mail  Please stop the isosorbide Monitor your blood pressure  Please call us if you have new issues that need to be addressed before your next appt.  Your physician wants you to follow-up in: 1 month.

## 2014-05-16 NOTE — Telephone Encounter (Signed)
[facility] and they state they did not have anything in their system as a flag--they did say that a Rx for diltiazem may be a problem as it is prescribed taking 4 times daily--is he supposed to be taking medication as I saw it was d/c by you--please advise

## 2014-05-16 NOTE — Telephone Encounter (Signed)
Diltiazem was d/c'd by cards

## 2014-05-16 NOTE — Assessment & Plan Note (Signed)
Weight is down likely from recent anorexia, in the setting of stroke. Appears relatively euvolemic, infect blood pressure is low concerning for mild dehydration. Will cut back on his blood pressure pills

## 2014-05-16 NOTE — Assessment & Plan Note (Addendum)
Recent embolic stroke. Likely from subtherapeutic INR, also with recent surgery in early January, off his warfarin for circumcision

## 2014-05-16 NOTE — Assessment & Plan Note (Signed)
Currently with no symptoms of angina. No further workup at this time. Continue current medication regimen. 

## 2014-05-16 NOTE — Addendum Note (Signed)
Addended by: Lurlean Nanny on: 05/16/2014 02:03 PM   Modules accepted: Orders, Medications

## 2014-05-16 NOTE — Progress Notes (Signed)
Patient ID: [name]., male    DOB: 1931/10/19, 81 y.o.   MRN: [id number]  HPI Comments: [name] is a pleasant 81 year old gentleman, who has a history of diabetes type 2, coronary artery disease, status post myocardial infarction treated with TPA at [facility] in January 29, 1996.   chronic atrial fibrillation since 2006, on warfarin. Walks with a cane. 6770 had a cva, uncertain if he was on anticoagulation at the time History off falls.  Seen by Dr. Raul Del, pulmonary. Also seen by CHF clinic. Several admissions to the hospital dating back to December 2014 with urine output function, urinary tract infections, status post stent placement by Dr. Elenor Quinones.  He presents today for follow-up after recent hospital admission 05/02/2014 with acute stroke. He was off warfarin for circumcision in early January 2016. He did have Lovenox bridge MRI showed multiple embolic strokes, left parietal, occipital areas. He was seen by neurology. He reports having continued vision problems. INR was 2 and he was continued on his warfarin. He is doing home physical therapy. At the time of discharge he was started on diltiazem. He does have chronic lower extremity edema. He has not received the diltiazem yet and is waiting for mail-order.  Recent carotid ultrasound showing bilateral mild plaque dated 05/03/2014 Echocardiogram 05/03/2014 showing normal LV systolic function, normal right ventricular systolic pressure MRI brain showing multiple punctate foci acute infarction left parietal, left occipital, left frontal lobes likely embolic, chronic right occipital infarct  EKG on today's visit shows age fibrillation with ventricular rate 75 bpm, old anterior MI, inferior MI INR 2 days ago was 1.6. INR today 1.8  Hemoglobin A1c elevated at 9.6  Other past medical history hospital admission January 26 2014 with discharge October 20 with urosepsis, bladder outflow obstruction.cultures grew  [facility] was called to place a Foley catheter. Initial creatinine 1.56, BUN 25.  Echocardiogram 01/25/2014 showing moderate pulmonary hypertension, normal ejection fraction Notes indicate he had antibiotics, diuresis through his hospital course with improvement of his renal function down to creatinine 1.26. Creatinine was checked 2 days ago and was up to 1.4 with BUN 29 He had a Foley catheter for 10 days which was removed  He fell in January 2015 after taking a nap, he fell into his dresser drawer. Significant trauma to his left arm He fell possibly in April 2015the details are unavailable. Did not hurt himself [place] again recently while in the bathroom. Lost his footing, fell backwards, broke the toilet lid, large left flank hematoma  Myoview in March 2007 showed an ejection fraction of 50% with an old scar at the apex and mild peri-infarct ischemia.   echocardiogram in January 2008 showed an ejection fraction at the lower limits of normal with moderate LVH, mild aortic root dilatation, and mild mitral regurgitation.  There was biatrial enlargement.   echocardiogram January 2015 showing ejection fraction 60%, moderately dilated left atrium, mild MR and TR, moderate pulmonary hypertension with right ventricular systolic pressure 53   Previous carotid Dopplers in January of 2008 showed 0-39% stenosis bilaterally. Carotid ultrasound may 2009 showing mild bilateral plaque, no significant stenoses  Total cholesterol 109  Allergies  Allergen Reactions  . Morphine And Related Other (See Comments)    Hallucinations   . Niacin And Related Dermatitis    Outpatient Encounter Prescriptions as of 05/16/2014  Medication Sig  . acetaminophen (TYLENOL) 650 MG CR tablet Take 650 mg by mouth every 8 (eight) hours as needed.    Marland Kitchen  albuterol (2.5 MG/3ML) 0.083% NEBU 3 mL, albuterol (5 MG/ML) 0.5% NEBU 0.5 mL Inhale 1 mg into the lungs.  Marland Kitchen albuterol (PROVENTIL HFA;VENTOLIN HFA) 108 (90 BASE)  MCG/ACT inhaler Inhale 2 puffs into the lungs every 6 (six) hours as needed.    . ALPRAZolam (XANAX) 0.25 MG tablet Take 1 tablet (0.25 mg total) by mouth at bedtime as needed for anxiety.  Marland Kitchen atorvastatin (LIPITOR) 40 MG tablet Take 40 mg by mouth daily.    . bimatoprost (LUMIGAN) 0.03 % ophthalmic solution Place 1 drop into both eyes at bedtime.   . budesonide-formoterol (SYMBICORT) 160-4.5 MCG/ACT inhaler Inhale 2 puffs into the lungs 2 (two) times daily.  . citalopram (CELEXA) 10 MG tablet Take 10 mg by mouth daily.   . finasteride (PROSCAR) 5 MG tablet Take 5 mg by mouth daily.  . furosemide (LASIX) 40 MG tablet Alternate 40mg  and 20mg  per day.  If your weight is up, then temporarily resume 40mg  each day.  Marland Kitchen LEVEMIR 100 UNIT/ML injection Inject 18 Units into the skin at bedtime.   Marland Kitchen lisinopril (PRINIVIL,ZESTRIL) 10 MG tablet Take 10 mg by mouth daily.  . metoprolol (TOPROL-XL) 50 MG 24 hr tablet Take 50 mg by mouth 2 (two) times daily.   . nitroGLYCERIN (NITROSTAT) 0.4 MG SL tablet Place 0.4 mg under the tongue every 5 (five) minutes as needed.    . nystatin-triamcinolone (MYCOLOG II) cream Apply 1 application topically 2 (two) times daily.   . potassium chloride (K-DUR) 10 MEQ tablet Take 3 tablets (30 mEq total) by mouth daily.  . predniSONE (DELTASONE) 10 MG tablet Take 5 mg by mouth daily with breakfast.   . sitaGLIPtin (JANUVIA) 100 MG tablet Take 1 tablet (100 mg total) by mouth daily.  . tamsulosin (FLOMAX) 0.4 MG CAPS capsule TAKE 1 CAPSULE DAILY  . tiotropium (SPIRIVA) 18 MCG inhalation capsule Place 18 mcg into inhaler and inhale daily.    Marland Kitchen voriconazole (VFEND) 200 MG tablet Take 1 tablet (200 mg total) by mouth 2 (two) times daily.  . [DISCONTINUED] aspirin 81 MG tablet Take 81 mg by mouth daily.    . [DISCONTINUED] diltiazem (CARDIZEM) 120 MG tablet Take 1 tablet (120 mg total) by mouth 4 (four) times daily.  . [DISCONTINUED] isosorbide dinitrate (ISORDIL) 40 MG tablet Take 40  mg by mouth 3 (three) times daily.    . [DISCONTINUED] warfarin (COUMADIN) 2 MG tablet Take 4 mg by mouth as directed. 2 tablets  . apixaban (ELIQUIS) 5 MG TABS tablet Take 1 tablet (5 mg total) by mouth 2 (two) times daily.  . [DISCONTINUED] lisinopril (PRINIVIL,ZESTRIL) 20 MG tablet Take 1 tablet (20 mg total) by mouth daily. (Patient not taking: Reported on 05/16/2014)    Past Medical History  Diagnosis Date  . MI     a. tx'd w/ TPA at [facility] 01/29/1996  . HYPERTENSION   . HYPERLIPIDEMIA   . CAD     a. Myoview 06/2005: EF 50%, scar @ apex, mild peri-infarct ischemia  . Chronic atrial fibrillation     a. since 2006; b. on warfarin  . DM   . History of kidney stones   . Neuropathy of both feet   . Chronic diastolic CHF (congestive heart failure)     a. echo 04/2006: EF lower limits of nl, mod LVH, mild aortic root dilatation, & mild MR, biatrial enlargement; b. echo 04/2013: EF 60%, mod dilated LA, mild MR & TR, mod pulm HTN w/ RV systolic pressure 53, c.  echo 04/21/14: EF 55-60%, unable to exclude WMA, severely dilated LA 6.6 cm, nl RVSP, mildly dilated aortic root  . Kidney stone     a. s/p left ureteral stenting 04/24/14  . CVA 1610,9604    Past Surgical History  Procedure Laterality Date  . Tonsillectomy and adenoidectomy  1959  . Carotid stent insertion  1997  . Cardiac catheterization  1997    [facility]  . Coronary angioplasty  1997    s/p stent placement x 2   . Kidney surgery  05/2013    s/p stent placement   . Bladder surgery      stent placement   . Circumcision  2016    Social History  reports that he has quit smoking. His smoking use included Cigarettes. He has a 80 pack-year smoking history. He quit smokeless tobacco use about 23 years ago. He reports that he does not drink alcohol or use illicit drugs.  Family History family history includes Diabetes in his maternal grandmother; Heart disease in his maternal grandmother and mother. There is no history of Cancer or  Stroke.  Review of Systems  Constitutional: Negative.   Eyes: Negative.   Respiratory: Positive for shortness of breath.   Cardiovascular: Positive for leg swelling.  Gastrointestinal: Negative.   Musculoskeletal: Positive for gait problem.  Neurological: Negative.   Hematological: Negative.   All other systems reviewed and are negative.   BP 90/60 mmHg  Pulse 78  Ht 5\' 6"  (1.676 m)  Wt 185 lb (83.915 kg)  BMI 29.87 kg/m2  Physical Exam  Constitutional: He is oriented to person, place, and time. He appears well-developed and well-nourished.  HENT:  Head: Normocephalic.  Nose: Nose normal.  Mouth/Throat: Oropharynx is clear and moist.  Eyes: Conjunctivae are normal. Pupils are equal, round, and reactive to light.  Neck: Normal range of motion. Neck supple. No JVD present.  Cardiovascular: Normal rate, regular rhythm, S1 normal, S2 normal, normal heart sounds and intact distal pulses.  Exam reveals no gallop and no friction rub.   No murmur heard. Pulmonary/Chest: Effort normal and breath sounds normal. No respiratory distress. He has no wheezes. He has no rales. He exhibits no tenderness.  Abdominal: Soft. Bowel sounds are normal. He exhibits no distension. There is no tenderness.  Musculoskeletal: Normal range of motion. He exhibits no edema or tenderness.  Lymphadenopathy:    He has no cervical adenopathy.  Neurological: He is alert and oriented to person, place, and time. Coordination normal.  Skin: Skin is warm and dry. No rash noted. No erythema.  Psychiatric: He has a normal mood and affect. His behavior is normal. Judgment and thought content normal.      Assessment and Plan   Nursing note and vitals reviewed.

## 2014-05-16 NOTE — Assessment & Plan Note (Signed)
INR subtherapeutic for the past several days. We will stop warfarin, stop aspirin, start Eliquis 5 mg twice a day. Recent stroke while on warfarin, likely from being subtherapeutic

## 2014-05-16 NOTE — Assessment & Plan Note (Signed)
Cholesterol is at goal on the current lipid regimen. No changes to the medications were made.  

## 2014-05-17 ENCOUNTER — Telehealth: Payer: Self-pay

## 2014-05-17 ENCOUNTER — Telehealth: Payer: Self-pay | Admitting: *Deleted

## 2014-05-17 NOTE — Telephone Encounter (Signed)
Spoke w/ pt's wife.   Advised her that Dr. Rockey Situ is here giving me her directions as we speak.  He advises pt to hold isosorbide is SBP < 120, if > 120, give 1/2 no more than TID.  She verbalizes understanding and will call back w/ any further questions or concerns.

## 2014-05-17 NOTE — Telephone Encounter (Signed)
Dr. Rockey Situ d/c'd diltiazem

## 2014-05-17 NOTE — Telephone Encounter (Signed)
Spoke to pt's wife and she is aware--she states she called Dr Donivan Scull office this morning for some advice

## 2014-05-17 NOTE — Telephone Encounter (Signed)
Pt  wife calling stating that pt stated that if his BP goes over 120 he is to take of half Isosorbide and that she wants to know what "high" mean.  She was not in the room when doctor told patient that   Please call wife back.   This morning is was 124/77 and now pt is saying that he says he could take just half, which is 20 mg.

## 2014-05-17 NOTE — Telephone Encounter (Signed)
[facility] requesting verification of diltiazem 120 mg instructions which were 1 tab qid; (max allowable is 1 tab tid). Also request verification of quantity of med.  [facility] cb at [phone] with ref # [id number]. It appears that Dr Rockey Situ d/c diltiazem at 05/16/14 visit. Please advise.

## 2014-05-20 ENCOUNTER — Ambulatory Visit (INDEPENDENT_AMBULATORY_CARE_PROVIDER_SITE_OTHER)
Admission: RE | Admit: 2014-05-20 | Discharge: 2014-05-20 | Disposition: A | Discharge status: Home / Self Care | Payer: Medicare Other | Source: Ambulatory Visit | Attending: Family Medicine | Admitting: Family Medicine

## 2014-05-20 ENCOUNTER — Encounter: Payer: Self-pay | Admitting: Family Medicine

## 2014-05-20 ENCOUNTER — Telehealth: Payer: Self-pay | Admitting: Internal Medicine

## 2014-05-20 ENCOUNTER — Ambulatory Visit (INDEPENDENT_AMBULATORY_CARE_PROVIDER_SITE_OTHER): Payer: Medicare Other | Admitting: Family Medicine

## 2014-05-20 VITALS — BP 104/60 | HR 84 | Temp 97.6°F | Wt 184.5 lb

## 2014-05-20 DIAGNOSIS — R059 Cough, unspecified: Secondary | ICD-10-CM

## 2014-05-20 DIAGNOSIS — R05 Cough: Secondary | ICD-10-CM

## 2014-05-20 LAB — CBC WITH DIFFERENTIAL/PLATELET
Basophils Absolute: 0 10*3/uL (ref 0.0–0.1)
Basophils Relative: 0.3 % (ref 0.0–3.0)
EOS ABS: 0.1 10*3/uL (ref 0.0–0.7)
EOS PCT: 1 % (ref 0.0–5.0)
HEMATOCRIT: 38.9 % — AB (ref 39.0–52.0)
Hemoglobin: 12.9 g/dL — ABNORMAL LOW (ref 13.0–17.0)
LYMPHS PCT: 19.1 % (ref 12.0–46.0)
Lymphs Abs: 2.2 10*3/uL (ref 0.7–4.0)
MCHC: 33.2 g/dL (ref 30.0–36.0)
MCV: 83.5 fl (ref 78.0–100.0)
MONOS PCT: 11.1 % (ref 3.0–12.0)
Monocytes Absolute: 1.3 10*3/uL — ABNORMAL HIGH (ref 0.1–1.0)
NEUTROS ABS: 8 10*3/uL — AB (ref 1.4–7.7)
Neutrophils Relative %: 68.5 % (ref 43.0–77.0)
PLATELETS: 274 10*3/uL (ref 150.0–400.0)
RBC: 4.65 Mil/uL (ref 4.22–5.81)
RDW: 18 % — ABNORMAL HIGH (ref 11.5–15.5)
WBC: 11.7 10*3/uL — AB (ref 4.0–10.5)

## 2014-05-20 MED ORDER — PREDNISONE 10 MG PO TABS: ORAL_TABLET | ORAL | Status: DC

## 2014-05-20 MED ORDER — DOXYCYCLINE HYCLATE 100 MG PO TABS: 100.0000 mg | ORAL_TABLET | Freq: Two times a day (BID) | ORAL | Status: DC

## 2014-05-20 NOTE — Telephone Encounter (Signed)
[facility]--pt is no longer taking this medication

## 2014-05-20 NOTE — Progress Notes (Signed)
Pre visit review using our clinic review tool, if applicable. No additional management support is needed unless otherwise documented below in the visit note.  To recap, recently with CVA and currently on antifungal treatment.  H/o DM2 known, with elevated A1c and d/w pt.  Recent cards eval done, changed from couamdin to eliquis.  Seen by cards and PCP last week.    Sx started about 3 days ago.  Cough, some sputum, stuffy, sx worse at night.  Some wheeze.  Prev with yellow sputum, now white or clear.  Still on vfend, not on abx o/w currently.  Off coumadin, on eliquis.  Still using his inhalers at baseline, some help with the cough.  Was cut down to 5mg  of prednisone recently.  Sugar has been usually ~150 recently, much improved recently.  Fever up to 101 yesterday.  Had sweats today.    Prev with dysuria, has urine culture per urology, that uro will f/u on. No dysuria now.    Meds, vitals, and allergies reviewed.   ROS: See HPI.  Otherwise, noncontributory.  Nad, speaking in complete sentences. In wheelchair OP wnl, no oral lesions, MMM, TM wnl, nasal exam slightly irritated and stuffy Neck supple, no LA IRR, not tachy Scant exp wheeze but no inc in wob.  No focal dec in BS abd soft, not ttp Ext with 1+ edema in compression stockings.

## 2014-05-20 NOTE — Telephone Encounter (Signed)
Pt has appt with Dr Damita Dunnings today at 2:15 pm.

## 2014-05-20 NOTE — Telephone Encounter (Signed)
Patient Name: [name] DOB: December 01, 1931 Initial Comment Caller states, husband used [facility], dx with a cold and a UTI yesterday, he coughed all night long, Abd is very sore due to coughing. She wants an Rx to help with the cough. Nurse Assessment Nurse: Vallery Sa, RN, Cathy Date/Time (Eastern Time): 05/20/2014 10:58:38 AM Confirm and document reason for call. If symptomatic, describe symptoms. ---[name] states that her husband was diagnosed with a UTI and a cold yesterday. He is to start antibiotics today. She is calling today because he coughed all last night. No severe breathing difficulty or blueness around his lips or face. No fever today. Has the patient traveled out of the country within the last 30 days? ---No Does the patient require triage? ---Yes Related visit to physician within the last 2 weeks? ---Yes Does the PT have any chronic conditions? (i.e. diabetes, asthma, etc.) ---Yes List chronic conditions. ---To start antibiotics for UTI today, CHF, Stroke, COPD Guidelines Guideline Title Affirmed Question Affirmed Notes Cough - Acute Productive Wheezing is present Final Disposition User See Physician within 4 Hours (or PCP triage) Vallery Sa, RN, [place] Comments Appointment scheduled for 2:15pm today with Dr. Damita Dunnings for new wheezing/severe cough.

## 2014-05-20 NOTE — Patient Instructions (Signed)
Go to the lab on the way out.  We'll contact you with your lab report. Increase the prednisone for the next week to 10mg  a day and then back town to 5mg  a day thereafter.  Start doxycycline today.  We may have to add extra antibiotics on that.  Take care.  Update [place] tomorrow if needed.

## 2014-05-20 NOTE — Telephone Encounter (Signed)
[facility] requesting cb about pt taking diltiazem using ref # [id number].

## 2014-05-21 ENCOUNTER — Encounter: Payer: Self-pay | Admitting: Family Medicine

## 2014-05-21 ENCOUNTER — Ambulatory Visit: Payer: Medicare Other | Admitting: Internal Medicine

## 2014-05-21 ENCOUNTER — Telehealth: Payer: Self-pay

## 2014-05-21 DIAGNOSIS — R059 Cough, unspecified: Secondary | ICD-10-CM | POA: Insufficient documentation

## 2014-05-21 DIAGNOSIS — R05 Cough: Secondary | ICD-10-CM | POA: Insufficient documentation

## 2014-05-21 NOTE — Telephone Encounter (Signed)
[name] pts wife left v/m requesting cb when lab results and CXR report from 05/20/14 is available.

## 2014-05-21 NOTE — Assessment & Plan Note (Signed)
Would inc his pred back to 10mg  a day, with understanding that his glucose will likely increase again.  We didn't change his insulin order yet, we'll have to see how long he needs the higher dose of pred and what his sugars are running.   Given his pulmonary baseline, with the fever recently and the sputum, would start doxy.  D/w pt.  See notes on cxr and labs.   Routed to PCP as FYI. Will get update on patient tomorrow. >25 minutes spent in face to face time with patient, >50% spent in counselling or coordination of care.

## 2014-05-21 NOTE — Addendum Note (Signed)
Addended by: Tonia Ghent on: 05/21/2014 12:12 PM   Modules accepted: Level of Service

## 2014-05-22 ENCOUNTER — Telehealth: Payer: Self-pay

## 2014-05-22 NOTE — Telephone Encounter (Signed)
Pt wife called, states Dr. Damita Dunnings saw pt yesterday, and pt has fluid in his chest, states he is coughing, but not coughing up anything. States he is making "gurgling sounds"

## 2014-05-22 NOTE — Telephone Encounter (Signed)
Spoke w/ pt's wife.  She reports that pt saw PCP yesterday for cough, dx w/ "fluid in his chest", given doxy & prednisone.  Reports pt did not sleep well last night, has to sit propped up in order to breathe. Pt's wt yesterday 178 lbs, today 180.4. Pt currently takes lasix 20mg  alternating days w/ 40 mg. Pt took 40 mg yesterday, did not want to pee all day, so he is hesitant to take it all today. She reports that pt is following a low Na+ diet, but he has been drinking lots of water this week.  Advised her to have pt limit his fluids and have him take 40 mg lasix today.  Advised her that if pt's mouth becomes dry, he can chew on ice chips.  She states he has some sugar free cough drops he can use. Asked her to call back if sx do not improve. She verbalizes understanding and will call back w/ any questions or concerns.

## 2014-05-23 ENCOUNTER — Telehealth: Payer: Self-pay | Admitting: Internal Medicine

## 2014-05-23 MED ORDER — LEVEMIR 100 UNIT/ML ~~LOC~~ SOLN: 20.0000 [IU] | Freq: Every day | SUBCUTANEOUS | Status: DC

## 2014-05-23 NOTE — Telephone Encounter (Signed)
Spoke to pt's wife and she is aware as instructed and expressed understanding

## 2014-05-23 NOTE — Telephone Encounter (Signed)
Please refer to msg below and send back to me

## 2014-05-23 NOTE — Telephone Encounter (Signed)
Patient's wife called.  She said Threasa Beards told her to call if patient's sugar was over 200.  Patient's sugar this morning was 213.  It had been running in the low 100s.  Patient's wife wants to know if they need to adjust his insulin.

## 2014-05-23 NOTE — Telephone Encounter (Signed)
Increase levemir to 20units a day.  Add 2 units per day until AM sugar is <140, ie 20-->22-->24-->26 etc if needed.  Once sugar is <140, then continue the most recent dose.  Thanks.

## 2014-05-23 NOTE — Addendum Note (Signed)
Addended by: Tonia Ghent on: 05/23/2014 02:06 PM   Modules accepted: Orders

## 2014-05-29 ENCOUNTER — Ambulatory Visit: Payer: Medicare Other

## 2014-05-29 ENCOUNTER — Encounter: Payer: Self-pay | Admitting: Internal Medicine

## 2014-05-29 ENCOUNTER — Ambulatory Visit (INDEPENDENT_AMBULATORY_CARE_PROVIDER_SITE_OTHER): Payer: Medicare Other | Admitting: Internal Medicine

## 2014-05-29 VITALS — BP 120/60 | HR 85 | Temp 97.7°F | Wt 179.0 lb

## 2014-05-29 DIAGNOSIS — R8299 Other abnormal findings in urine: Secondary | ICD-10-CM

## 2014-05-29 DIAGNOSIS — E119 Type 2 diabetes mellitus without complications: Secondary | ICD-10-CM

## 2014-05-29 DIAGNOSIS — R05 Cough: Secondary | ICD-10-CM

## 2014-05-29 DIAGNOSIS — R059 Cough, unspecified: Secondary | ICD-10-CM

## 2014-05-29 DIAGNOSIS — R82998 Other abnormal findings in urine: Secondary | ICD-10-CM

## 2014-05-29 LAB — POCT URINALYSIS DIPSTICK
Bilirubin, UA: NEGATIVE
GLUCOSE UA: 3000
Ketones, UA: NEGATIVE
Nitrite, UA: NEGATIVE
PROTEIN UA: POSITIVE
Spec Grav, UA: 1.02
UROBILINOGEN UA: NEGATIVE
pH, UA: 6

## 2014-05-29 MED ORDER — LEVOFLOXACIN 500 MG PO TABS: 500.0000 mg | ORAL_TABLET | Freq: Every day | ORAL | Status: DC

## 2014-05-29 MED ORDER — HYDROCODONE-HOMATROPINE 5-1.5 MG/5ML PO SYRP: 5.0000 mL | ORAL_SOLUTION | Freq: Three times a day (TID) | ORAL | Status: DC | PRN

## 2014-05-29 MED ORDER — LEVEMIR 100 UNIT/ML ~~LOC~~ SOLN: 20.0000 [IU] | Freq: Every day | SUBCUTANEOUS | Status: DC

## 2014-05-29 NOTE — Patient Instructions (Signed)
Cough, Adult  A cough is a reflex that helps clear your throat and airways. It can help heal the body or may be a reaction to an irritated airway. A cough may only last 2 or 3 weeks (acute) or may last more than 8 weeks (chronic).  CAUSES Acute cough:  Viral or bacterial infections. Chronic cough:  Infections.  Allergies.  Asthma.  Post-nasal drip.  Smoking.  Heartburn or acid reflux.  Some medicines.  Chronic lung problems (COPD).  Cancer. SYMPTOMS   Cough.  Fever.  Chest pain.  Increased breathing rate.  High-pitched whistling sound when breathing (wheezing).  Colored mucus that you cough up (sputum). TREATMENT   A bacterial cough may be treated with antibiotic medicine.  A viral cough must run its course and will not respond to antibiotics.  Your caregiver may recommend other treatments if you have a chronic cough. HOME CARE INSTRUCTIONS   Only take over-the-counter or prescription medicines for pain, discomfort, or fever as directed by your caregiver. Use cough suppressants only as directed by your caregiver.  Use a cold steam vaporizer or humidifier in your bedroom or home to help loosen secretions.  Sleep in a semi-upright position if your cough is worse at night.  Rest as needed.  Stop smoking if you smoke. SEEK IMMEDIATE MEDICAL CARE IF:   You have pus in your sputum.  Your cough starts to worsen.  You cannot control your cough with suppressants and are losing sleep.  You begin coughing up blood.  You have difficulty breathing.  You develop pain which is getting worse or is uncontrolled with medicine.  You have a fever. MAKE SURE YOU:   Understand these instructions.  Will watch your condition.  Will get help right away if you are not doing well or get worse. Document Released: 09/25/2010 Document Revised: 06/21/2011 Document Reviewed: 09/25/2010 [facility] Patient Information 2015 [place], [place]. This information is not intended  to replace advice given to you by your health care provider. Make sure you discuss any questions you have with your health care provider. Cough, Adult  A cough is a reflex that helps clear your throat and airways. It can help heal the body or may be a reaction to an irritated airway. A cough may only last 2 or 3 weeks (acute) or may last more than 8 weeks (chronic).  CAUSES Acute cough:  Viral or bacterial infections. Chronic cough:  Infections.  Allergies.  Asthma.  Post-nasal drip.  Smoking.  Heartburn or acid reflux.  Some medicines.  Chronic lung problems (COPD).  Cancer. SYMPTOMS   Cough.  Fever.  Chest pain.  Increased breathing rate.  High-pitched whistling sound when breathing (wheezing).  Colored mucus that you cough up (sputum). TREATMENT   A bacterial cough may be treated with antibiotic medicine.  A viral cough must run its course and will not respond to antibiotics.  Your caregiver may recommend other treatments if you have a chronic cough. HOME CARE INSTRUCTIONS   Only take over-the-counter or prescription medicines for pain, discomfort, or fever as directed by your caregiver. Use cough suppressants only as directed by your caregiver.  Use a cold steam vaporizer or humidifier in your bedroom or home to help loosen secretions.  Sleep in a semi-upright position if your cough is worse at night.  Rest as needed.  Stop smoking if you smoke. SEEK IMMEDIATE MEDICAL CARE IF:   You have pus in your sputum.  Your cough starts to worsen.  You cannot control  your cough with suppressants and are losing sleep.  You begin coughing up blood.  You have difficulty breathing.  You develop pain which is getting worse or is uncontrolled with medicine.  You have a fever. MAKE SURE YOU:   Understand these instructions.  Will watch your condition.  Will get help right away if you are not doing well or get worse. Document Released: 09/25/2010  Document Revised: 06/21/2011 Document Reviewed: 09/25/2010 [facility] Patient Information 2015 [place], [place]. This information is not intended to replace advice given to you by your health care provider. Make sure you discuss any questions you have with your health care provider.

## 2014-05-29 NOTE — Progress Notes (Signed)
Pre visit review using our clinic review tool, if applicable. No additional management support is needed unless otherwise documented below in the visit note. 

## 2014-05-29 NOTE — Progress Notes (Signed)
Subjective:    Patient ID: [name]., male    DOB: [birth year], 81 y.o.   MRN: [id number]  HPI  Pt presents to the clinic today with c/o dark urine. His wife noticed this twice in one night one day last week. She has not noticed it that dark in the last week. He denies burning when he urinates. He denies fever, chills or body aches. He has been drinking plenty of fluids.  Pt also c/o productive cough x 2 weeks. He is coughing up yellow mucous. He saw Dr. Damita Dunnings 05/20/14 for the same. CXR showed a possible pneumonitis of RUL. His prednisone was increased to 10 mg daily. He was also treated with a 10 day course of Doxycycline. He reports that he has not noticed any improvement. He has been using Robiutssin DM without any relief.   Review of Systems      Past Medical History  Diagnosis Date  . MI     a. tx'd w/ TPA at [facility] 01/29/1996  . HYPERTENSION   . HYPERLIPIDEMIA   . CAD     a. Myoview 06/2005: EF 50%, scar @ apex, mild peri-infarct ischemia  . Chronic atrial fibrillation     a. since 2006; b. on warfarin  . DM   . History of kidney stones   . Neuropathy of both feet   . Chronic diastolic CHF (congestive heart failure)     a. echo 04/2006: EF lower limits of nl, mod LVH, mild aortic root dilatation, & mild MR, biatrial enlargement; b. echo 04/2013: EF 60%, mod dilated LA, mild MR & TR, mod pulm HTN w/ RV systolic pressure 53, c. echo 04/21/14: EF 55-60%, unable to exclude WMA, severely dilated LA 6.6 cm, nl RVSP, mildly dilated aortic root  . Kidney stone     a. s/p left ureteral stenting 04/24/14  . CVA [id number]    Current Outpatient Prescriptions  Medication Sig Dispense Refill  . acetaminophen (TYLENOL) 650 MG CR tablet Take 650 mg by mouth every 8 (eight) hours as needed.      Marland Kitchen albuterol (2.5 MG/3ML) 0.083% NEBU 3 mL, albuterol (5 MG/ML) 0.5% NEBU 0.5 mL Inhale 1 mg into the lungs.    Marland Kitchen albuterol (PROVENTIL HFA;VENTOLIN HFA) 108 (90 BASE) MCG/ACT inhaler Inhale 2  puffs into the lungs every 6 (six) hours as needed.      . ALPRAZolam (XANAX) 0.25 MG tablet Take 1 tablet (0.25 mg total) by mouth at bedtime as needed for anxiety. 90 tablet 0  . apixaban (ELIQUIS) 5 MG TABS tablet Take 1 tablet (5 mg total) by mouth 2 (two) times daily. 60 tablet   . atorvastatin (LIPITOR) 40 MG tablet Take 40 mg by mouth daily.      . bimatoprost (LUMIGAN) 0.03 % ophthalmic solution Place 1 drop into both eyes at bedtime.     . budesonide-formoterol (SYMBICORT) 160-4.5 MCG/ACT inhaler Inhale 2 puffs into the lungs 2 (two) times daily.    . citalopram (CELEXA) 10 MG tablet Take 10 mg by mouth daily.     Marland Kitchen doxycycline (VIBRA-TABS) 100 MG tablet Take 1 tablet (100 mg total) by mouth 2 (two) times daily. 20 tablet 0  . finasteride (PROSCAR) 5 MG tablet Take 5 mg by mouth daily.    . furosemide (LASIX) 40 MG tablet Alternate 40mg  and 20mg  per day.  If your weight is up, then temporarily resume 40mg  each day. 90 tablet 3  . isosorbide dinitrate (ISORDIL) 40 MG  tablet Take 40 mg by mouth 3 (three) times daily. Take 20 mg if his BP is over 120/. For anything less than 120/, take nothing.    Marland Kitchen LEVEMIR 100 UNIT/ML injection Inject 0.2 mLs (20 Units total) into the skin at bedtime. Add 2 units per day until AM sugar is <140    . lisinopril (PRINIVIL,ZESTRIL) 10 MG tablet Take 10 mg by mouth daily.    . metoprolol (TOPROL-XL) 50 MG 24 hr tablet Take 50 mg by mouth 2 (two) times daily.     . nitroGLYCERIN (NITROSTAT) 0.4 MG SL tablet Place 0.4 mg under the tongue every 5 (five) minutes as needed.      . nystatin-triamcinolone (MYCOLOG II) cream Apply 1 application topically 2 (two) times daily.     . potassium chloride (K-DUR) 10 MEQ tablet Take 3 tablets (30 mEq total) by mouth daily. 270 tablet 3  . predniSONE (DELTASONE) 10 MG tablet 10mg  a day for 1 week, then back down to 5mg  a day.    . sitaGLIPtin (JANUVIA) 100 MG tablet Take 1 tablet (100 mg total) by mouth daily. 90 tablet 1  .  tamsulosin (FLOMAX) 0.4 MG CAPS capsule TAKE 1 CAPSULE DAILY 90 capsule 0  . tiotropium (SPIRIVA) 18 MCG inhalation capsule Place 18 mcg into inhaler and inhale daily.      Marland Kitchen voriconazole (VFEND) 200 MG tablet Take 1 tablet (200 mg total) by mouth 2 (two) times daily. 20 tablet 0   No current facility-administered medications for this visit.    Allergies  Allergen Reactions  . Morphine And Related Other (See Comments)    Hallucinations   . Niacin And Related Dermatitis    Family History  Problem Relation Age of Onset  . Heart disease Maternal Grandmother   . Diabetes Maternal Grandmother   . Cancer Neg Hx   . Stroke Neg Hx   . Heart disease Mother     History   Social History  . Marital Status: Married    Spouse Name: N/A  . Number of Children: N/A  . Years of Education: N/A   Occupational History  . Not on file.   Social History Main Topics  . Smoking status: Former Smoker -- 2.00 packs/day for 40 years    Types: Cigarettes  . Smokeless tobacco: Former Systems developer    Quit date: 05/24/1990  . Alcohol Use: No  . Drug Use: No  . Sexual Activity: Not Currently   Other Topics Concern  . Not on file   Social History Narrative     Constitutional: Pt reports fatigue. Denies fever, malaise, headache or abrupt weight changes.  HEENT: Denies eye pain, eye redness, ear pain, ringing in the ears, wax buildup, runny nose, nasal congestion, bloody nose, or sore throat. Respiratory: Pt reports cough and shortness of breath. Denies difficulty breathing.   Cardiovascular: Denies chest pain, chest tightness, palpitations or swelling in the hands or feet.  Gastrointestinal: Denies abdominal pain, bloating, constipation, diarrhea or blood in the stool.  GU: Pt reports dark urine. Denies urgency, frequency, pain with urination, burning sensation, blood in urine, odor or discharge. Neurological: Denies dizziness, difficulty with memory, difficulty with speech.   No other specific  complaints in a complete review of systems (except as listed in HPI above).  Objective:   Physical Exam   BP 120/60 mmHg  Pulse 85  Temp(Src) 97.7 F (36.5 C) (Oral)  Wt 179 lb (81.194 kg) Wt Readings from Last 3 Encounters:  05/29/14 179  lb (81.194 kg)  05/20/14 184 lb 8 oz (83.689 kg)  05/16/14 185 lb (83.915 kg)    General: Appears his stated age, chronically ill appearing in NAD. Skin: Warm, dry and intact. No rashes, lesions or ulcerations noted. HEENT: Head: normal shape and size; Eyes: sclera white, no icterus, conjunctiva pink, PERRLA and EOMs intact; Ears: Tm's gray and intact, normal light reflex; Nose: mucosa pink and moist, septum midline; Throat/Mouth: Teeth present, mucosa pink and moist, no exudate, lesions or ulcerations noted.  Neck: No adenopathy noted.  Cardiovascular: Irregular with normal rhythm. S1,S2 noted.  No murmur, rubs or gallops noted.  Pulmonary/Chest: Normal effort and positive vesicular breath sounds. Crackles noted in the RLL. No respiratory distress. No wheezes or ronchi noted.  Abdomen: Soft and nontender. Normal bowel sounds, no bruits noted. No distention or masses noted. Liver, spleen and kidneys non palpable. No CVA tenderness. Neurological: Alert and oriented.    BMET    Component Value Date/Time   NA 136 02/07/2014 1510   K 4.0 02/07/2014 1510   CL 104 02/07/2014 1510   CO2 26 02/07/2014 1510   GLUCOSE 193* 02/07/2014 1510   BUN 29* 02/07/2014 1510   CREATININE 1.4 02/07/2014 1510   CALCIUM 8.9 02/07/2014 1510    Lipid Panel     Component Value Date/Time   CHOL 109 06/22/2013 1416   TRIG 192.0* 06/22/2013 1416   HDL 29.80* 06/22/2013 1416   CHOLHDL 4 06/22/2013 1416   VLDL 38.4 06/22/2013 1416   LDLCALC 41 06/22/2013 1416    CBC    Component Value Date/Time   WBC 11.7* 05/20/2014 1501   RBC 4.65 05/20/2014 1501   HGB 12.9* 05/20/2014 1501   HCT 38.9* 05/20/2014 1501   PLT 274.0 05/20/2014 1501   MCV 83.5 05/20/2014  1501   MCHC 33.2 05/20/2014 1501   RDW 18.0* 05/20/2014 1501   LYMPHSABS 2.2 05/20/2014 1501   MONOABS 1.3* 05/20/2014 1501   EOSABS 0.1 05/20/2014 1501   BASOSABS 0.0 05/20/2014 1501    Hgb A1C Lab Results  Component Value Date   HGBA1C 9.6* 02/15/2014        Assessment & Plan:   Cough:  Chest xray reviewed No improvement with increased prednisone and doxycyline ? CHF exacerbation Increase Lasix to 40 mg daily x 1 week eRx for Levaquin 500 mg daily x 10 days in case this is an unresolved pneumonia RX for Hycodan cough syrup  If symptoms persist, consider repeat chest xray  Dark Urine:  Urinalysis: trace leuks, large blood He has kidney stones, likely the source of dark urine/blood in urine If infectious, Levaquin should cover Continue to push fluids  Additionally, he needs refill of his Levemir. His fasting sugar was 233 this morning. He is up to 24 units of Levemir at night.  DM2:  Continue increasing Lantus by 2 units a week until fasting sugar below 140. Levemir refilled today  RTC as needed or if symptoms persist or worsen

## 2014-05-29 NOTE — Addendum Note (Signed)
Addended by: Lurlean Nanny on: 05/29/2014 04:56 PM   Modules accepted: Orders

## 2014-05-30 DIAGNOSIS — E119 Type 2 diabetes mellitus without complications: Secondary | ICD-10-CM

## 2014-05-30 DIAGNOSIS — I10 Essential (primary) hypertension: Secondary | ICD-10-CM

## 2014-05-30 DIAGNOSIS — I69351 Hemiplegia and hemiparesis following cerebral infarction affecting right dominant side: Secondary | ICD-10-CM

## 2014-05-30 DIAGNOSIS — I4891 Unspecified atrial fibrillation: Secondary | ICD-10-CM

## 2014-05-30 DIAGNOSIS — N400 Enlarged prostate without lower urinary tract symptoms: Secondary | ICD-10-CM

## 2014-05-30 LAB — URINE CULTURE
Colony Count: NO GROWTH
Organism ID, Bacteria: NO GROWTH

## 2014-06-03 ENCOUNTER — Telehealth: Payer: Self-pay | Admitting: Internal Medicine

## 2014-06-03 ENCOUNTER — Inpatient Hospital Stay: Payer: Self-pay | Admitting: Internal Medicine

## 2014-06-03 ENCOUNTER — Telehealth: Payer: Self-pay

## 2014-06-03 NOTE — Telephone Encounter (Signed)
Have pt call urologist

## 2014-06-03 NOTE — Telephone Encounter (Signed)
Pt's wife called, pt's urine bloody and pt doesn't feel well.  Spoke to PCP, pt advised to call Urologist Dr. Hollice Espy at [facility].  [name] will call back if needs assistance getting an appointment.

## 2014-06-03 NOTE — Telephone Encounter (Signed)
Spoke w/ pt's wife.  She reports that pt's urine is dark red, he is having chills and does not feel well.  Advised her to call pt's PCP, as he was recently in the hospital for pneumonia, UTI & sepsis, and his sx need to be addressed immediately.  She verbalizes understanding and asks that Dr. Rockey Situ still recommend if he can stop his Eliquis for a short while.

## 2014-06-03 NOTE — Telephone Encounter (Signed)
Would probably not stop eliquis until there is confirmation there is blood in the urine. We can sent a lab if PMD unable to do this. Some abx change urine different colors. Also could check CBC  He has had numerous strokes in the past seen on head scan  Would be concerned if he comes off anticoagulation, high risk for another stroke

## 2014-06-03 NOTE — Telephone Encounter (Signed)
Pt wife called, states pt went to PCP, and was dx with pneumonia and UTI, states pt urine has gotten "really red", she thinks it is from his blood thinner, and she would like to discuss this. Please call.

## 2014-06-04 NOTE — Telephone Encounter (Signed)
Was documented on lab and imaging results.

## 2014-06-04 NOTE — Telephone Encounter (Signed)
Left detailed message on pt's vm w/ Dr. Donivan Scull recommendation.  Asked pt to call back w/ any questions or concerns.

## 2014-06-11 ENCOUNTER — Encounter
Admit: 2014-06-11 | Disposition: A | Discharge status: Home / Self Care | Payer: Self-pay | Attending: Internal Medicine | Admitting: Internal Medicine

## 2014-06-11 ENCOUNTER — Other Ambulatory Visit: Payer: Self-pay

## 2014-06-11 MED ORDER — CITALOPRAM HYDROBROMIDE 10 MG PO TABS: 10.0000 mg | ORAL_TABLET | Freq: Every day | ORAL | Status: DC

## 2014-06-11 NOTE — Telephone Encounter (Signed)
Pt [facility]. Shawn Silversmith NP has never filled citalpram before; pt said [name] used to prescribe citalopram but he does not see her anymore.Please advise.

## 2014-06-12 ENCOUNTER — Telehealth: Payer: Self-pay

## 2014-06-12 DIAGNOSIS — E119 Type 2 diabetes mellitus without complications: Secondary | ICD-10-CM

## 2014-06-12 NOTE — Telephone Encounter (Signed)
[name] left v/m; pt will soon need refill of insulin [facility] will be faxing a request for refill and will need clarification of quantity of insulin to be given. [name] request cb.

## 2014-06-13 ENCOUNTER — Telehealth: Payer: Self-pay | Admitting: *Deleted

## 2014-06-13 MED ORDER — LEVEMIR 100 UNIT/ML ~~LOC~~ SOLN: 28.0000 [IU] | Freq: Every day | SUBCUTANEOUS | Status: DC

## 2014-06-13 NOTE — Telephone Encounter (Signed)
Request for surgical clearance:  1. What type of surgery is being performed? left uregula stent exchange   2. When is this surgery scheduled? April 4th  3. Are there any medications that need to be held prior to surgery and how long? No   4. Name of physician performing surgery? Dr Hollice Espy  5. What is your office phone and fax number? [facility] [phone]  Pt is coming tomorrow for a apt with [place], wanted to know if we could do this then.

## 2014-06-13 NOTE — Telephone Encounter (Signed)
Spoke to pt and he states his BG readings have been under 140 recently while taking 28 units and he needs refill--Rx refilled with new units--

## 2014-06-14 ENCOUNTER — Ambulatory Visit (INDEPENDENT_AMBULATORY_CARE_PROVIDER_SITE_OTHER): Payer: Medicare Other | Admitting: Cardiovascular Disease

## 2014-06-14 ENCOUNTER — Encounter: Payer: Self-pay | Admitting: Cardiovascular Disease

## 2014-06-14 VITALS — BP 100/62 | HR 78 | Ht 66.0 in | Wt 184.5 lb

## 2014-06-14 DIAGNOSIS — I2510 Atherosclerotic heart disease of native coronary artery without angina pectoris: Secondary | ICD-10-CM

## 2014-06-14 DIAGNOSIS — I10 Essential (primary) hypertension: Secondary | ICD-10-CM

## 2014-06-14 DIAGNOSIS — E1165 Type 2 diabetes mellitus with hyperglycemia: Secondary | ICD-10-CM

## 2014-06-14 DIAGNOSIS — R296 Repeated falls: Secondary | ICD-10-CM

## 2014-06-14 DIAGNOSIS — I639 Cerebral infarction, unspecified: Secondary | ICD-10-CM

## 2014-06-14 DIAGNOSIS — I5032 Chronic diastolic (congestive) heart failure: Secondary | ICD-10-CM

## 2014-06-14 DIAGNOSIS — I4891 Unspecified atrial fibrillation: Secondary | ICD-10-CM

## 2014-06-14 MED ORDER — APIXABAN 5 MG PO TABS: 5.0000 mg | ORAL_TABLET | Freq: Two times a day (BID) | ORAL | Status: DC

## 2014-06-14 NOTE — Patient Instructions (Signed)
You are doing well. No medication changes were made.  Please confirm the eliquis is twice a day  Please call us if you have new issues that need to be addressed before your next appt.  Your physician wants you to follow-up in: 6 months.  You will receive a reminder letter in the mail two months in advance. If you don't receive a letter, please call our office to schedule the follow-up appointment.

## 2014-06-14 NOTE — Progress Notes (Signed)
Patient ID: [name]., male    DOB: 05-26-1931, 81 y.o.   MRN: [id number]  HPI Comments: [name] is a pleasant 81 year old gentleman, who has a history of diabetes type 2, coronary artery disease, status post myocardial infarction treated with TPA at [facility] in January 29, 1996.   chronic atrial fibrillation since 2006, on warfarin. Walks with a cane. [id number] had a cva, uncertain if he was on anticoagulation at the time History off falls.  Seen by Dr. Raul Del, pulmonary. Also seen by CHF clinic. Several admissions to the hospital dating back to December 2014 with urine output function, urinary tract infections, status post stent placement by Dr. Elenor Quinones. He presents today for follow-up of his hypotension, atrial fibrillation.  On his last clinic visit, systolic pressure was 80. We have suggested he put hold parameters on his isosorbide, take only for systolic pressure more than 120. In follow-up today he reports blood pressure typically ranges 110 up to 120s, he takes isosorbide for blood pressures in the 130-140 range, 20 mg dose. In general he feels well. No bleeding on the eliquis. He has stopped warfarin as this was frequent a subtherapeutic and he has a history of strokes documented on MRI. Legs continued to feel weak. He reports having a recent fall, fell backwards when walking with his walker Also was in the hospital 06/03/2014 with discharge on the 26th with urinary tract infection requiring antibiotics. He is scheduled to have ureter stent removed with urology.  EKG on today's visit shows atrial fibrillation with ventricular rate 78 bpm, old inferior MI, nonspecific ST abnormality  Other past medical history  hospital admission 05/02/2014 with acute stroke. He was off warfarin for circumcision in early January 2016. He did have Lovenox bridge MRI showed multiple embolic strokes, left parietal, occipital areas. He was seen by neurology. He reports having continued  vision problems. INR was 2 and he was continued on his warfarin. He is doing home physical therapy. At the time of discharge he was started on diltiazem. He does have chronic lower extremity edema. He has not received the diltiazem yet and is waiting for mail-order.  Recent carotid ultrasound showing bilateral mild plaque dated 05/03/2014 Echocardiogram 05/03/2014 showing normal LV systolic function, normal right ventricular systolic pressure MRI brain showing multiple punctate foci acute infarction left parietal, left occipital, left frontal lobes likely embolic, chronic right occipital infarct  EKG on today's visit shows age fibrillation with ventricular rate 75 bpm, old anterior MI, inferior MI INR 2 days ago was 1.6. INR today 1.8  Hemoglobin A1c elevated at 9.6  Other past medical history hospital admission January 26 2014 with discharge October 20 with urosepsis, bladder outflow obstruction.cultures grew [facility] was called to place a Foley catheter. Initial creatinine 1.56, BUN 25.  Echocardiogram 01/25/2014 showing moderate pulmonary hypertension, normal ejection fraction Notes indicate he had antibiotics, diuresis through his hospital course with improvement of his renal function down to creatinine 1.26. Creatinine was checked 2 days ago and was up to 1.4 with BUN 29 He had a Foley catheter for 10 days which was removed  He fell in January 2015 after taking a nap, he fell into his dresser drawer. Significant trauma to his left arm He fell possibly in April 2015the details are unavailable. Did not hurt himself [place] again recently while in the bathroom. Lost his footing, fell backwards, broke the toilet lid, large left flank hematoma  Myoview in March 2007 showed an ejection fraction of 50%  with an old scar at the apex and mild peri-infarct ischemia.   echocardiogram in January 2008 showed an ejection fraction at the lower limits of normal with moderate LVH, mild aortic root  dilatation, and mild mitral regurgitation.  There was biatrial enlargement.   echocardiogram January 2015 showing ejection fraction 60%, moderately dilated left atrium, mild MR and TR, moderate pulmonary hypertension with right ventricular systolic pressure 53   Previous carotid Dopplers in January of 2008 showed 0-39% stenosis bilaterally. Carotid ultrasound may 2009 showing mild bilateral plaque, no significant stenoses  Total cholesterol 109  Allergies  Allergen Reactions  . Morphine And Related Other (See Comments)    Hallucinations   . Niacin And Related Dermatitis    Outpatient Encounter Prescriptions as of 06/14/2014  Medication Sig  . acetaminophen (TYLENOL) 650 MG CR tablet Take 650 mg by mouth every 8 (eight) hours as needed.    Marland Kitchen albuterol (2.5 MG/3ML) 0.083% NEBU 3 mL, albuterol (5 MG/ML) 0.5% NEBU 0.5 mL Inhale 1 mg into the lungs.  Marland Kitchen albuterol (PROVENTIL HFA;VENTOLIN HFA) 108 (90 BASE) MCG/ACT inhaler Inhale 2 puffs into the lungs every 6 (six) hours as needed.    . ALPRAZolam (XANAX) 0.25 MG tablet Take 1 tablet (0.25 mg total) by mouth at bedtime as needed for anxiety.  Marland Kitchen apixaban (ELIQUIS) 5 MG TABS tablet Take 1 tablet (5 mg total) by mouth 2 (two) times daily.  Marland Kitchen atorvastatin (LIPITOR) 40 MG tablet Take 40 mg by mouth daily.    . bimatoprost (LUMIGAN) 0.03 % ophthalmic solution Place 1 drop into both eyes at bedtime.   . budesonide-formoterol (SYMBICORT) 160-4.5 MCG/ACT inhaler Inhale 2 puffs into the lungs 2 (two) times daily.  . citalopram (CELEXA) 10 MG tablet Take 1 tablet (10 mg total) by mouth daily.  . finasteride (PROSCAR) 5 MG tablet Take 5 mg by mouth daily.  . furosemide (LASIX) 40 MG tablet Alternate 40mg  and 20mg  per day.  If your weight is up, then temporarily resume 40mg  each day.  . isosorbide dinitrate (ISORDIL) 40 MG tablet Take 40 mg by mouth 3 (three) times daily. Take 20 mg if his BP is over 120/. For anything less than 120/, take nothing.  Marland Kitchen  LEVEMIR 100 UNIT/ML injection Inject 0.28 mLs (28 Units total) into the skin at bedtime. Add 2 units per day until AM sugar is <140 Dx E11.65  . lisinopril (PRINIVIL,ZESTRIL) 10 MG tablet Take 10 mg by mouth daily.  . metoprolol (TOPROL-XL) 50 MG 24 hr tablet Take 50 mg by mouth 2 (two) times daily.   . nitroGLYCERIN (NITROSTAT) 0.4 MG SL tablet Place 0.4 mg under the tongue every 5 (five) minutes as needed.    . nystatin-triamcinolone (MYCOLOG II) cream Apply 1 application topically 2 (two) times daily.   . potassium chloride (K-DUR) 10 MEQ tablet Take 3 tablets (30 mEq total) by mouth daily.  . predniSONE (DELTASONE) 10 MG tablet 10mg  a day for 1 week, then back down to 5mg  a day.  . sitaGLIPtin (JANUVIA) 100 MG tablet Take 1 tablet (100 mg total) by mouth daily.  . tamsulosin (FLOMAX) 0.4 MG CAPS capsule TAKE 1 CAPSULE DAILY  . tiotropium (SPIRIVA) 18 MCG inhalation capsule Place 18 mcg into inhaler and inhale daily.    Marland Kitchen voriconazole (VFEND) 200 MG tablet Take 1 tablet (200 mg total) by mouth 2 (two) times daily.  . [DISCONTINUED] apixaban (ELIQUIS) 5 MG TABS tablet Take 1 tablet (5 mg total) by mouth 2 (two) times  daily.  . [DISCONTINUED] HYDROcodone-homatropine (HYCODAN) 5-1.5 MG/5ML syrup Take 5 mLs by mouth every 8 (eight) hours as needed for cough.  . [DISCONTINUED] doxycycline (VIBRA-TABS) 100 MG tablet Take 1 tablet (100 mg total) by mouth 2 (two) times daily. (Patient not taking: Reported on 06/14/2014)  . [DISCONTINUED] levofloxacin (LEVAQUIN) 500 MG tablet Take 1 tablet (500 mg total) by mouth daily. (Patient not taking: Reported on 06/14/2014)    Past Medical History  Diagnosis Date  . MI     a. tx'd w/ TPA at [facility] 01/29/1996  . HYPERTENSION   . HYPERLIPIDEMIA   . CAD     a. Myoview 06/2005: EF 50%, scar @ apex, mild peri-infarct ischemia  . Chronic atrial fibrillation     a. since 2006; b. on warfarin  . DM   . History of kidney stones   . Neuropathy of both feet   . Chronic  diastolic CHF (congestive heart failure)     a. echo 04/2006: EF lower limits of nl, mod LVH, mild aortic root dilatation, & mild MR, biatrial enlargement; b. echo 04/2013: EF 60%, mod dilated LA, mild MR & TR, mod pulm HTN w/ RV systolic pressure 53, c. echo 04/21/14: EF 55-60%, unable to exclude WMA, severely dilated LA 6.6 cm, nl RVSP, mildly dilated aortic root  . Kidney stone     a. s/p left ureteral stenting 04/24/14  . CVA 4098,1191    Past Surgical History  Procedure Laterality Date  . Tonsillectomy and adenoidectomy  1959  . Carotid stent insertion  1997  . Cardiac catheterization  1997    [facility]  . Coronary angioplasty  1997    s/p stent placement x 2   . Kidney surgery  05/2013    s/p stent placement   . Bladder surgery      stent placement   . Circumcision  2016    Social History  reports that he has quit smoking. His smoking use included Cigarettes. He has a 80 pack-year smoking history. He quit smokeless tobacco use about 24 years ago. He reports that he does not drink alcohol or use illicit drugs.  Family History family history includes Diabetes in his maternal grandmother; Heart disease in his maternal grandmother and mother. There is no history of Cancer or Stroke.  Review of Systems  Constitutional: Negative.   Respiratory: Negative.   Cardiovascular: Positive for leg swelling.  Gastrointestinal: Negative.   Musculoskeletal: Positive for gait problem.  Neurological: Negative.   Hematological: Negative.   All other systems reviewed and are negative.   BP 100/62 mmHg  Pulse 78  Ht 5\' 6"  (1.676 m)  Wt 184 lb 8 oz (83.689 kg)  BMI 29.79 kg/m2  Physical Exam  Constitutional: He is oriented to person, place, and time. He appears well-developed and well-nourished.  HENT:  Head: Normocephalic.  Nose: Nose normal.  Mouth/Throat: Oropharynx is clear and moist.  Eyes: Conjunctivae are normal. Pupils are equal, round, and reactive to light.  Neck: Normal range of  motion. Neck supple. No JVD present.  Cardiovascular: Normal rate, regular rhythm, S1 normal, S2 normal, normal heart sounds and intact distal pulses.  Exam reveals no gallop and no friction rub.   No murmur heard. Pulmonary/Chest: Effort normal and breath sounds normal. No respiratory distress. He has no wheezes. He has no rales. He exhibits no tenderness.  Abdominal: Soft. Bowel sounds are normal. He exhibits no distension. There is no tenderness.  Musculoskeletal: Normal range of motion. He exhibits no  edema or tenderness.  Lymphadenopathy:    He has no cervical adenopathy.  Neurological: He is alert and oriented to person, place, and time. Coordination normal.  Skin: Skin is warm and dry. No rash noted. No erythema.  Psychiatric: He has a normal mood and affect. His behavior is normal. Judgment and thought content normal.      Assessment and Plan   Nursing note and vitals reviewed.

## 2014-06-14 NOTE — Assessment & Plan Note (Signed)
Currently with no symptoms of angina. No further workup at this time. Continue current medication regimen. 

## 2014-06-14 NOTE — Assessment & Plan Note (Addendum)
Chronic atrial fibrillation. He is tolerating Eliquis 5 mg twice a day. Heart rate relatively well-controlled. No changes to his medications

## 2014-06-14 NOTE — Assessment & Plan Note (Signed)
Encouraged him to minimize his use of isosorbide, only for systolic pressures more than 140. Suggested he call for low blood pressures. If needed, lisinopril could be cut in half

## 2014-06-14 NOTE — Assessment & Plan Note (Signed)
Recent fall while using his walker. Recommended he be very careful as he is on anticoagulation

## 2014-06-14 NOTE — Assessment & Plan Note (Signed)
We have encouraged continued exercise, careful diet management in an effort to lose weight. Most recent hemoglobin A1c 9.6.. Needs close follow-up with primary care

## 2014-06-14 NOTE — Telephone Encounter (Signed)
Faxed clearance.

## 2014-06-14 NOTE — Assessment & Plan Note (Signed)
Recent embolic stroke. Likely from subtherapeutic INR, also with recent surgery in early January, off his warfarin for circumcision Now on eliquis for stroke prevention, atrial fibrillation

## 2014-06-14 NOTE — Assessment & Plan Note (Signed)
Appears relatively euvolemic, blood pressure improved on less isosorbide. Leg edema likely from venous insufficiency. Weight stable. No changes to his medications

## 2014-06-17 NOTE — Telephone Encounter (Signed)
We discussed this on his visit, he is acceptable risk

## 2014-06-19 ENCOUNTER — Encounter: Payer: Self-pay | Admitting: Internal Medicine

## 2014-06-19 ENCOUNTER — Telehealth: Payer: Self-pay | Admitting: *Deleted

## 2014-06-19 ENCOUNTER — Ambulatory Visit (INDEPENDENT_AMBULATORY_CARE_PROVIDER_SITE_OTHER): Payer: Medicare Other | Admitting: Internal Medicine

## 2014-06-19 VITALS — BP 114/58 | HR 80 | Temp 97.9°F | Wt 183.0 lb

## 2014-06-19 DIAGNOSIS — I10 Essential (primary) hypertension: Secondary | ICD-10-CM

## 2014-06-19 DIAGNOSIS — R296 Repeated falls: Secondary | ICD-10-CM

## 2014-06-19 DIAGNOSIS — R531 Weakness: Secondary | ICD-10-CM

## 2014-06-19 DIAGNOSIS — B3749 Other urogenital candidiasis: Secondary | ICD-10-CM

## 2014-06-19 DIAGNOSIS — E1165 Type 2 diabetes mellitus with hyperglycemia: Secondary | ICD-10-CM

## 2014-06-19 MED ORDER — GABAPENTIN 100 MG PO CAPS: 100.0000 mg | ORAL_CAPSULE | Freq: Three times a day (TID) | ORAL | Status: DC

## 2014-06-19 MED ORDER — "INSULIN SYRINGE-NEEDLE U-100 28G X 1/2"" 0.3 ML MISC": 1.0000 | Freq: Every day | Status: DC

## 2014-06-19 NOTE — Assessment & Plan Note (Signed)
On the low end Imdur has been stopped Will send message to Dr. Rockey Situ about reducing Lisinopril Labs reviewed

## 2014-06-19 NOTE — Assessment & Plan Note (Signed)
A1C very elevated Advised his wife to continue increasing Levemir every 3 days until fasting sugars remain around 140 Continue Januvia for now Advised him to be more strict with diabetic diet Some evidence of neuropathy, start Neurontin 100 mg in the am- sedation caution given

## 2014-06-19 NOTE — Progress Notes (Signed)
Pre visit review using our clinic review tool, if applicable. No additional management support is needed unless otherwise documented below in the visit note. 

## 2014-06-19 NOTE — Patient Instructions (Signed)
Urosepsis Urosepsis is an infection that has spread to the bloodstream from the urinary tract. It is a severe illness. If it is not treated immediately, urosepsis can be life threatening. CAUSES  The cause of urosepsis is not known.  RISK FACTORS  Advanced age.  Having diabetes mellitus.  Having a weakened immune system.  Having kidney stones. SIGNS AND SYMPTOMS   Fever or low body temperature (hypothermia).  Rapid breathing (hyperventilation).  Chills.  Rapid heartbeat (tachycardia). When sepsis is severe, low blood pressure and decreased oxygen flow to your vital organs may also occur. This is called shock. DIAGNOSIS  Your health care provider may suspect urosepsis based on your medical history and a physical exam. Urine and blood tests may be done to confirm a diagnosis of urosepsis. Other tests, such as X-ray exams, ultrasound exams, or a CT scan, may be done to determine the severity of your condition.  TREATMENT  Urosepsis requires prompt treatment with medicines. You will receive fluids through an IV tube to support your blood pressure. You may also receive medicines to increase your blood pressure and medicines to control pain or nausea.  HOME CARE INSTRUCTIONS   Take medicines only as directed by your health care provider.  Drink enough fluids to keep your urine clear or pale yellow. In addition to water, cranberry juice is recommended.  Avoid caffeine, tea, and carbonated beverages. They can irritate the bladder.  Avoid alcohol. It may irritate the prostate, if this applies.  Get plenty of rest. Increase your activity as tolerated.  Keep all follow-up visits as directed by your health care provider.  Empty your bladder often. Avoid holding urine for long periods of time.  Empty your bladder before and after sexual intercourse.  After a bowel movement, women should wipe from front to back using each tissue only once. SEEK MEDICAL CARE IF:   You develop back  pain.  You develop nausea or vomiting.  Your symptoms are not better after 3 days.  You develop a fever or chills. SEEK IMMEDIATE MEDICAL CARE IF:   You feel light-headed or develop shortness of breath.  You are getting worse, not better. MAKE SURE YOU:  Understand these instructions.  Will watch your condition.  Will get help right away if you are not doing well or get worse. Document Released: 03/29/2005 Document Revised: 08/13/2013 Document Reviewed: 12/04/2012 [facility] Patient Information 2015 [place], [place]. This information is not intended to replace advice given to you by your health care provider. Make sure you discuss any questions you have with your health care provider.

## 2014-06-19 NOTE — Telephone Encounter (Signed)
[facility], calling stating that pt has had some falls and this past Sunday he had one  She did an orthopedic's on patient.  Took BP  108/62 sitting  90/50 standing up This was done yesterday.  No changes in medication have been made right now.  Needs to know if she needs to do anything else on patient.  Okay to leave message on voice mail.

## 2014-06-19 NOTE — Progress Notes (Signed)
Subjective:    Patient ID: [name]., male    DOB: [birth year], 81 y.o.   MRN: [id number]  HPI  Pt presents to the clinic today for hospital follow up. He was admitted to [facility] for urosepsis 06/03/2014. He had been being treated as an outpatient but symptoms had not improved on Levaquin. He was started on broad spectrum antibiotics and antifungals. Urine culture grew out Candida Glabrata. He has a CT abd/pelvis showed patent stent, with large nonobstructive stone in the left kidney. He was discharged on antifungal therapy and advised to follow up with Urology for management of his UTI as well as surgical evaluation to remove the kidney stones to prevent further UTI's. Since he has been home, he has fallen twice. Both times, he was trying to reach for his walker. He has bruising to his right elbow, and left arm. They have already followed up with urology. He is to continue the antifungal medication x 2 weeks. He is scheduled 07/15/14 to replace stent and will make a decision about removing stones at a later date. Contributing factors the predispose him to UTI are BPH, DM2, and being uncircumcised. He did undergo circumcision at the end of last year, hoping this would help. He voids fine on Flomax and Proscar. His last A1C is was 12.1 but sugars have been lower since starting insulin. He is on Januvia as well. He does reports fasting sugars as high as 200. He is on Levemir 28 units at bedtime. He is non compliant with diabetic diet. He does reports a sever burning sensation in his feet. The pain seems worse in the morning. He is not sure if this has contributed to his falls or not. He has not taken anything for the pain.  HTN: His BP today is 114/58. His cardiologist stopped his Imdur due to low blood pressure. He has been feeling weak and very dizzy when he stands up. His home health nurse came yesterday and took orthostatic blood pressure readings. He did have > 10 point drop. Per [name], he was  advised that if he kept falling, he should call his cardiologist and let him know but he has not done this.  Review of Systems      Past Medical History  Diagnosis Date  . MI     a. tx'd w/ TPA at [facility] 01/29/1996  . HYPERTENSION   . HYPERLIPIDEMIA   . CAD     a. Myoview 06/2005: EF 50%, scar @ apex, mild peri-infarct ischemia  . Chronic atrial fibrillation     a. since 2006; b. on warfarin  . DM   . History of kidney stones   . Neuropathy of both feet   . Chronic diastolic CHF (congestive heart failure)     a. echo 04/2006: EF lower limits of nl, mod LVH, mild aortic root dilatation, & mild MR, biatrial enlargement; b. echo 04/2013: EF 60%, mod dilated LA, mild MR & TR, mod pulm HTN w/ RV systolic pressure 53, c. echo 04/21/14: EF 55-60%, unable to exclude WMA, severely dilated LA 6.6 cm, nl RVSP, mildly dilated aortic root  . Kidney stone     a. s/p left ureteral stenting 04/24/14  . CVA 0349,1791    Current Outpatient Prescriptions  Medication Sig Dispense Refill  . acetaminophen (TYLENOL) 650 MG CR tablet Take 650 mg by mouth every 8 (eight) hours as needed.      Marland Kitchen albuterol (2.5 MG/3ML) 0.083% NEBU 3 mL, albuterol (5  MG/ML) 0.5% NEBU 0.5 mL Inhale 1 mg into the lungs.    Marland Kitchen albuterol (PROVENTIL HFA;VENTOLIN HFA) 108 (90 BASE) MCG/ACT inhaler Inhale 2 puffs into the lungs every 6 (six) hours as needed.      . ALPRAZolam (XANAX) 0.25 MG tablet Take 1 tablet (0.25 mg total) by mouth at bedtime as needed for anxiety. 90 tablet 0  . apixaban (ELIQUIS) 5 MG TABS tablet Take 1 tablet (5 mg total) by mouth 2 (two) times daily. 180 tablet 3  . atorvastatin (LIPITOR) 40 MG tablet Take 40 mg by mouth daily.      . bimatoprost (LUMIGAN) 0.03 % ophthalmic solution Place 1 drop into both eyes at bedtime.     . budesonide-formoterol (SYMBICORT) 160-4.5 MCG/ACT inhaler Inhale 2 puffs into the lungs 2 (two) times daily.    . citalopram (CELEXA) 10 MG tablet Take 1 tablet (10 mg total) by mouth  daily. 90 tablet 1  . finasteride (PROSCAR) 5 MG tablet Take 5 mg by mouth daily.    . furosemide (LASIX) 40 MG tablet Alternate 40mg  and 20mg  per day.  If your weight is up, then temporarily resume 40mg  each day. 90 tablet 3  . isosorbide dinitrate (ISORDIL) 40 MG tablet Take 40 mg by mouth 3 (three) times daily. Take 20 mg if his BP is over 120/. For anything less than 120/, take nothing.    Marland Kitchen LEVEMIR 100 UNIT/ML injection Inject 0.28 mLs (28 Units total) into the skin at bedtime. Add 2 units per day until AM sugar is <140 Dx E11.65 30 mL 1  . lisinopril (PRINIVIL,ZESTRIL) 10 MG tablet Take 10 mg by mouth daily.    . metoprolol (TOPROL-XL) 50 MG 24 hr tablet Take 50 mg by mouth 2 (two) times daily.     . nitroGLYCERIN (NITROSTAT) 0.4 MG SL tablet Place 0.4 mg under the tongue every 5 (five) minutes as needed.      . nystatin-triamcinolone (MYCOLOG II) cream Apply 1 application topically 2 (two) times daily.     . potassium chloride (K-DUR) 10 MEQ tablet Take 3 tablets (30 mEq total) by mouth daily. 270 tablet 3  . predniSONE (DELTASONE) 10 MG tablet 10mg  a day for 1 week, then back down to 5mg  a day.    . sitaGLIPtin (JANUVIA) 100 MG tablet Take 1 tablet (100 mg total) by mouth daily. 90 tablet 1  . tamsulosin (FLOMAX) 0.4 MG CAPS capsule TAKE 1 CAPSULE DAILY 90 capsule 0  . tiotropium (SPIRIVA) 18 MCG inhalation capsule Place 18 mcg into inhaler and inhale daily.      Marland Kitchen voriconazole (VFEND) 200 MG tablet Take 1 tablet (200 mg total) by mouth 2 (two) times daily. 20 tablet 0   No current facility-administered medications for this visit.    Allergies  Allergen Reactions  . Morphine And Related Other (See Comments)    Hallucinations   . Niacin And Related Dermatitis    Family History  Problem Relation Age of Onset  . Heart disease Maternal Grandmother   . Diabetes Maternal Grandmother   . Cancer Neg Hx   . Stroke Neg Hx   . Heart disease Mother     History   Social History  .  Marital Status: Married    Spouse Name: N/A  . Number of Children: N/A  . Years of Education: N/A   Occupational History  . Not on file.   Social History Main Topics  . Smoking status: Former Smoker -- 2.00 packs/day  for 40 years    Types: Cigarettes  . Smokeless tobacco: Former Systems developer    Quit date: 05/24/1990  . Alcohol Use: No  . Drug Use: No  . Sexual Activity: Not Currently   Other Topics Concern  . Not on file   Social History Narrative     Constitutional: Pt reports fatigue. Denies fever, malaise, headache or abrupt weight changes.  Respiratory: Denies difficulty breathing, shortness of breath, cough or sputum production.   Cardiovascular: Denies chest pain, chest tightness, palpitations or swelling in the hands or feet.  Gastrointestinal: Denies abdominal pain, bloating, constipation, diarrhea or blood in the stool.  GU: Denies urgency, frequency, pain with urination, burning sensation, blood in urine, odor or discharge. Musculoskeletal: Pt reports generalized weakness. Denies decrease in range of motion, muscle pain or joint pain and swelling.  Skin: Pt reports bruising and skin tears to bilateral arms. Denies redness, rashes, lesions or ulcercations.  Neurological: Pt reports lightheadedness and problems with balance and coordination. Denies difficulty with memory, difficulty with speech.   No other specific complaints in a complete review of systems (except as listed in HPI above).  Objective:   Physical Exam   BP 114/58 mmHg  Pulse 80  Temp(Src) 97.9 F (36.6 C) (Oral)  Wt 183 lb (83.008 kg)  SpO2 98% Wt Readings from Last 3 Encounters:  06/19/14 183 lb (83.008 kg)  06/14/14 184 lb 8 oz (83.689 kg)  05/29/14 179 lb (81.194 kg)    General: Appears his stated age, chronically ill appearing in NAD. Skin: Warm, dry and intact. Bruising to bilateral arms. Skin tears covered with tefla. Cardiovascular: Irregular with normal rhythm. S1,S2 noted.  No murmur, rubs  or gallops noted. Trace BLE edema.  Pulmonary/Chest: Normal effort and positive vesicular breath sounds. No respiratory distress. No wheezes, rales or ronchi noted.  Abdomen: Soft, nontender, active bowel sounds. Musculoskeletal: Difficult to assess in the wheelchair. He has been discharged from PT. Neurological: Alert and oriented. Sensation decreased by 10 gm monofilament   BMET    Component Value Date/Time   NA 136 02/07/2014 1510   K 4.0 02/07/2014 1510   CL 104 02/07/2014 1510   CO2 26 02/07/2014 1510   GLUCOSE 193* 02/07/2014 1510   BUN 29* 02/07/2014 1510   CREATININE 1.4 02/07/2014 1510   CALCIUM 8.9 02/07/2014 1510    Lipid Panel     Component Value Date/Time   CHOL 109 06/22/2013 1416   TRIG 192.0* 06/22/2013 1416   HDL 29.80* 06/22/2013 1416   CHOLHDL 4 06/22/2013 1416   VLDL 38.4 06/22/2013 1416   LDLCALC 41 06/22/2013 1416    CBC    Component Value Date/Time   WBC 11.7* 05/20/2014 1501   RBC 4.65 05/20/2014 1501   HGB 12.9* 05/20/2014 1501   HCT 38.9* 05/20/2014 1501   PLT 274.0 05/20/2014 1501   MCV 83.5 05/20/2014 1501   MCHC 33.2 05/20/2014 1501   RDW 18.0* 05/20/2014 1501   LYMPHSABS 2.2 05/20/2014 1501   MONOABS 1.3* 05/20/2014 1501   EOSABS 0.1 05/20/2014 1501   BASOSABS 0.0 05/20/2014 1501    Hgb A1C Lab Results  Component Value Date   HGBA1C 9.6* 02/15/2014        Assessment & Plan:   Hospital follow up for urosepsis:  Hospital notes, labs, and consultation reports reviewed He will drink plenty of fluids He will continue antifungal as directed He will have stent replacement 07/15/14 He was instructed by urology at any signs of UTI,  dark or cloudy urine, urine odor or dysuria, to call their office immediately

## 2014-06-19 NOTE — Assessment & Plan Note (Signed)
I am surprised that he has been released from PT Wife is not interested in PT evaluation at this time

## 2014-06-19 NOTE — Telephone Encounter (Signed)
Would decrease the lisinopril down to 5 mg daily Continue to monitor blood pressure

## 2014-06-20 ENCOUNTER — Other Ambulatory Visit: Payer: Self-pay

## 2014-06-20 MED ORDER — "INSULIN SYRINGE-NEEDLE U-100 28G X 1/2"" 0.3 ML MISC": 1.0000 | Freq: Every day | Status: DC

## 2014-06-20 NOTE — Telephone Encounter (Signed)
[facility] and [facility] did not get insulin needle rx. Advised pt would resend now.

## 2014-06-20 NOTE — Telephone Encounter (Signed)
Left message on Amy's vm w/ Dr. Donivan Scull recommendation.  Asked her to call back w/ any questions or concerns.

## 2014-06-24 ENCOUNTER — Telehealth: Payer: Self-pay

## 2014-06-24 NOTE — Telephone Encounter (Signed)
Spoke w/ pt.  Advised him of Dr. Donivan Scull recommendation.  He verbalizes understanding and is agreeable, though he reports that he took his 1/2 lisinopril already this am.  He reports that he is having his ureter stent removed and was advised to "stop aspirin & blood thinners on 3/28", which will be 7 days before his procedure.  Advised him that I will make Dr. Rockey Situ aware and to have performing office send over clearance request for his review.

## 2014-06-24 NOTE — Telephone Encounter (Signed)
-----   Message from Minna Merritts, MD sent at 06/22/2014  9:24 PM EST ----- Would hold the lisinopril Would also decrease the lasix to 20 mg daily We can call him on Monday thx  Esmond Plants  ----- Message -----    From: Jearld Fenton, NP    Sent: 06/19/2014   8:27 PM      To: Minna Merritts, MD  Dr. Rockey Situ, Mr Seder has had 2 more falls since his last hospitalization. Home health verified that he is orthostatic. BP today was 114/58. Do you think it would be reasonable to scale back the Lisinopril or would your prefer not to? Thank you for your time!

## 2014-06-24 NOTE — Progress Notes (Signed)
                   Call pt with Dr. Rockey Situ response:   Would hold the Lisinopril Decrease the Lasix to 20 mg daily  They should also be giving pt a call

## 2014-06-26 ENCOUNTER — Telehealth: Payer: Self-pay | Admitting: *Deleted

## 2014-06-26 ENCOUNTER — Telehealth: Payer: Self-pay

## 2014-06-26 NOTE — Telephone Encounter (Signed)
Pt needing prior auth. For Eliquis 5 mg . Forms have been filled out and faxed to [facility] for approval.

## 2014-06-26 NOTE — Telephone Encounter (Signed)
Mrs Glosser said pt is up to 36 Units of insulin; the last 4 days pts FBS has been over 200; 216,220 and today 250. Pt's wife said he is watching his diet more closely but last night he did eat a big bag of popcorn. Pt is feeling better even though sugars are not coming down. Webb Silversmith NP said to continue doing what she is doing and pt needs to be more careful with his diabetic diet. Mrs Conlee voiced understanding and will cb next wk with update, sooner if needed.

## 2014-06-26 NOTE — Telephone Encounter (Signed)
Pt needs a prior Auth for his refill on eliquis.  [facility]

## 2014-06-26 NOTE — Telephone Encounter (Signed)
Pt is aware that Prior authorization is already in process.

## 2014-06-26 NOTE — Telephone Encounter (Signed)
See previous phone note.  

## 2014-07-02 ENCOUNTER — Ambulatory Visit: Payer: Self-pay | Admitting: Urology

## 2014-07-04 ENCOUNTER — Ambulatory Visit (INDEPENDENT_AMBULATORY_CARE_PROVIDER_SITE_OTHER): Payer: Medicare Other | Admitting: Endocrinology

## 2014-07-04 ENCOUNTER — Ambulatory Visit (INDEPENDENT_AMBULATORY_CARE_PROVIDER_SITE_OTHER): Payer: Medicare Other | Admitting: Internal Medicine

## 2014-07-04 ENCOUNTER — Encounter: Payer: Self-pay | Admitting: Endocrinology

## 2014-07-04 ENCOUNTER — Encounter: Payer: Self-pay | Admitting: Internal Medicine

## 2014-07-04 VITALS — BP 110/68 | HR 87 | Temp 97.8°F | Wt 181.0 lb

## 2014-07-04 VITALS — BP 106/68 | HR 78 | Resp 14 | Ht 66.0 in | Wt 181.0 lb

## 2014-07-04 DIAGNOSIS — E785 Hyperlipidemia, unspecified: Secondary | ICD-10-CM

## 2014-07-04 DIAGNOSIS — I10 Essential (primary) hypertension: Secondary | ICD-10-CM

## 2014-07-04 DIAGNOSIS — E1165 Type 2 diabetes mellitus with hyperglycemia: Secondary | ICD-10-CM

## 2014-07-04 LAB — HEMOGLOBIN A1C: Hgb A1c MFr Bld: 10.8 % — ABNORMAL HIGH (ref 4.6–6.5)

## 2014-07-04 LAB — POCT CBG (FASTING - GLUCOSE)-MANUAL ENTRY: GLUCOSE FASTING, POC: 405 mg/dL — AB (ref 70–99)

## 2014-07-04 MED ORDER — LEVEMIR 100 UNIT/ML ~~LOC~~ SOLN: 25.0000 [IU] | Freq: Every day | SUBCUTANEOUS | Status: DC

## 2014-07-04 MED ORDER — INSULIN LISPRO 100 UNIT/ML (KWIKPEN): 8.0000 [IU] | PEN_INJECTOR | Freq: Three times a day (TID) | SUBCUTANEOUS | Status: DC

## 2014-07-04 MED ORDER — INSULIN PEN NEEDLE 32G X 4 MM MISC: Status: DC

## 2014-07-04 MED ORDER — GABAPENTIN 100 MG PO CAPS: 100.0000 mg | ORAL_CAPSULE | Freq: Three times a day (TID) | ORAL | Status: DC

## 2014-07-04 NOTE — Progress Notes (Signed)
Reason for visit-  [name]. is a 81 y.o.-year-old male, referred by his PCP,  Jearld Fenton, NP for management of Type 2 diabetes, uncontrolled, with complications ( stage 3 CKD, neuropathy). Associated hx of macrovascular disease (CAD,CHF and stroke). Here with wife, who administers insulin.  HPI- Patient has been diagnosed with diabetes in 23-25 years~age 71s. Recalls being initially on lifestyle modifications.  Tried  Metformin, Glimeperide, Januvia. he has not been insulin since 2016.    *Off metformin since jan 2016>> got started on levemir after recent hospitalization *Now back on januvia after being off of it >> Back on 100 mg daily for several months ( 100 dose last month)  * On prednsione since 8-9 months , now on 5 mg daily for 8 weeks and plan to continue till next appt>> fu in 2 months ( Dr Raul Del, pulm)   * multiple yeast and UTI recently, recd temporary ureteral stent Feb 2016 after episode of urinary blockage, now going to get a permanent one April 4th. Was at pre-op and sugar was very high last week. Set up with follow up appt with PCP today and referred here on an urgent basis for control of sugars prior to surgery   Pt is currently on a regimen of: - januvia 100 mg daily - Levemir 46  units qhs ( increasing by 2 units based on morn sugars)    Last hemoglobin A1c was: Lab Results  Component Value Date   HGBA1C 10.8* 07/04/2014   HGBA1C 9.6* 02/15/2014   HGBA1C 7.9* 06/22/2013     Pt checks his sugars 1 a day . Uses ? glucometer. By log they are:  PREMEAL Breakfast Lunch Dinner Bedtime Overall  Glucose range: 147-324      Mean/median:        POST-MEAL PC Breakfast PC Lunch PC Dinner  Glucose range:     Mean/median:       Hypoglycemia-  No lows. Lowest sugar was n/a; he has hypoglycemia awareness at 70. About 3 months ago got down to 34 and needed EMS assistance  Dietary habits- eats three times daily.  Normally with BF - eggs Lunch -  soup/sandwich,/tuna/breast chicken/salad. Variable eating times. Sweet tea once weekly Supper- hold off on bread, sweet potatotes Exercise- recent stroke this Winter 2015, now with walker>>vison still blurred, home nurse weekly visits, OT, PT done Weight - lost 15 lbs in past 2-3 months Wt Readings from Last 3 Encounters:  07/04/14 181 lb (82.101 kg)  07/04/14 181 lb (82.101 kg)  06/19/14 183 lb (83.008 kg)    Diabetes Complications-  Nephropathy- Yes Stage 3  CKD, last BUN/creatinine-   Lab Results  Component Value Date   BUN 29* 02/07/2014   CREATININE 1.4 02/07/2014   Lab Results  Component Value Date   GFR 53.57* 02/07/2014   No components found for: MICRLABCREAT    Retinopathy- No, Last DEE was in , due for repeat exam, but other medical issues Neuropathy- has numbness and tingling in )his feet. Known neuropathy. On neurontin  Associated history - CAD s/p stents, CHF, afib . Prior stroke and recent stroke. No hypothyroidism. his last TSH was No results found for: TSH  Hyperlipidemia-  his last set of lipids were- Currently on Lipitor 40. Tolerating well.   Lab Results  Component Value Date   CHOL 109 06/22/2013   HDL 29.80* 06/22/2013   LDLCALC 41 06/22/2013   TRIG 192.0* 06/22/2013   CHOLHDL 4 06/22/2013  Blood Pressure/HTN- Patient's blood pressure is well controlled today on current regimen.  Pt has FH of DM in MGM.  I have reviewed the patient's past medical history, family and social history, surgical history, medications and allergies.  Past Medical History  Diagnosis Date  . MI     a. tx'd w/ TPA at [facility] 01/29/1996  . HYPERTENSION   . HYPERLIPIDEMIA   . CAD     a. Myoview 06/2005: EF 50%, scar @ apex, mild peri-infarct ischemia  . Chronic atrial fibrillation     a. since 2006; b. on warfarin  . DM   . History of kidney stones   . Neuropathy of both feet   . Chronic diastolic CHF (congestive heart failure)     a. echo 04/2006: EF lower limits of nl,  mod LVH, mild aortic root dilatation, & mild MR, biatrial enlargement; b. echo 04/2013: EF 60%, mod dilated LA, mild MR & TR, mod pulm HTN w/ RV systolic pressure 53, c. echo 04/21/14: EF 55-60%, unable to exclude WMA, severely dilated LA 6.6 cm, nl RVSP, mildly dilated aortic root  . Kidney stone     a. s/p left ureteral stenting 04/24/14  . CVA [id number]   Past Surgical History  Procedure Laterality Date  . Tonsillectomy and adenoidectomy  1959  . Carotid stent insertion  1997  . Cardiac catheterization  1997    [facility]  . Coronary angioplasty  1997    s/p stent placement x 2   . Kidney surgery  05/2013    s/p stent placement   . Bladder surgery      stent placement   . Circumcision  2016   Family History  Problem Relation Age of Onset  . Heart disease Maternal Grandmother   . Diabetes Maternal Grandmother   . Cancer Neg Hx   . Stroke Neg Hx   . Heart disease Mother    History   Social History  . Marital Status: Married    Spouse Name: N/A  . Number of Children: N/A  . Years of Education: N/A   Occupational History  . Not on file.   Social History Main Topics  . Smoking status: Former Smoker -- 2.00 packs/day for 40 years    Types: Cigarettes  . Smokeless tobacco: Former Systems developer    Quit date: 05/24/1990  . Alcohol Use: No  . Drug Use: No  . Sexual Activity: Not Currently   Other Topics Concern  . Not on file   Social History Narrative   Current Outpatient Prescriptions on File Prior to Visit  Medication Sig Dispense Refill  . acetaminophen (TYLENOL) 650 MG CR tablet Take 650 mg by mouth every 8 (eight) hours as needed.      Marland Kitchen albuterol (2.5 MG/3ML) 0.083% NEBU 3 mL, albuterol (5 MG/ML) 0.5% NEBU 0.5 mL Inhale 1 mg into the lungs.    Marland Kitchen albuterol (PROVENTIL HFA;VENTOLIN HFA) 108 (90 BASE) MCG/ACT inhaler Inhale 2 puffs into the lungs every 6 (six) hours as needed.      . ALPRAZolam (XANAX) 0.25 MG tablet Take 1 tablet (0.25 mg total) by mouth at bedtime as needed for  anxiety. 90 tablet 0  . apixaban (ELIQUIS) 5 MG TABS tablet Take 1 tablet (5 mg total) by mouth 2 (two) times daily. 180 tablet 3  . atorvastatin (LIPITOR) 40 MG tablet Take 40 mg by mouth daily.      . bimatoprost (LUMIGAN) 0.03 % ophthalmic solution Place 1 drop into both eyes  at bedtime.     . budesonide-formoterol (SYMBICORT) 160-4.5 MCG/ACT inhaler Inhale 2 puffs into the lungs 2 (two) times daily.    . citalopram (CELEXA) 10 MG tablet Take 1 tablet (10 mg total) by mouth daily. 90 tablet 1  . finasteride (PROSCAR) 5 MG tablet Take 5 mg by mouth daily.    . furosemide (LASIX) 40 MG tablet Alternate 40mg  and 20mg  per day.  If your weight is up, then temporarily resume 40mg  each day. 90 tablet 3  . gabapentin (NEURONTIN) 100 MG capsule Take 1 capsule (100 mg total) by mouth 3 (three) times daily. 270 capsule 1  . metoprolol (TOPROL-XL) 50 MG 24 hr tablet Take 50 mg by mouth 2 (two) times daily.     . nitroGLYCERIN (NITROSTAT) 0.4 MG SL tablet Place 0.4 mg under the tongue every 5 (five) minutes as needed.      . nystatin-triamcinolone (MYCOLOG II) cream Apply 1 application topically 2 (two) times daily.     . potassium chloride (K-DUR) 10 MEQ tablet Take 3 tablets (30 mEq total) by mouth daily. 270 tablet 3  . predniSONE (DELTASONE) 10 MG tablet 10mg  a day for 1 week, then back down to 5mg  a day. (Patient taking differently: Take 5 mg by mouth daily with breakfast. 10mg  a day for 1 week, then back down to 5mg  a day.)    . sitaGLIPtin (JANUVIA) 100 MG tablet Take 1 tablet (100 mg total) by mouth daily. 90 tablet 1  . tamsulosin (FLOMAX) 0.4 MG CAPS capsule TAKE 1 CAPSULE DAILY 90 capsule 0  . tiotropium (SPIRIVA) 18 MCG inhalation capsule Place 18 mcg into inhaler and inhale daily.       No current facility-administered medications on file prior to visit.   Allergies  Allergen Reactions  . Morphine And Related Other (See Comments)    Hallucinations   . Niacin And Related Dermatitis      Review of Systems: [x]  complains of  [  ] denies General:   [  ] Recent weight change [  ] Fatigue  [  ] Loss of appetite Eyes: [ x ]  Vision Difficulty [  ]  Eye pain ENT: [ x ]  Hearing difficulty [  ]  Difficulty Swallowing CVS: [  ] Chest pain [  ]  Palpitations/Irregular Heart beat [  ]  Shortness of breath lying flat [ x ] Swelling of legs Resp: [  ] Frequent Cough [ x ] Shortness of Breath  [  ]  Wheezing GI: [  ] Heartburn  [  ] Nausea or Vomiting  [  ] Diarrhea [  ] Constipation  [  ] Abdominal Pain GU: [  ]  Polyuria  [  x]  nocturia Bones/joints:  [  x]  Muscle aches  [x  ] Joint Pain  [  ] Bone pain Skin/Hair/Nails: [  ]  Rash  [  ] New stretch marks [  ]  Itching [  ] Hair loss [  ]  Excessive hair growth Reproduction: [  ] Low sexual desire , [  ]  Women: Menstrual cycle problems [  ]  Women: Breast Discharge [  ] Men: Difficulty with erections [  ]  Men: Enlarged Breasts CNS: [  ] Frequent Headaches [  x] Blurry vision [  ] Tremors [  ] Seizures [  ] Loss of consciousness [  ] Localized weakness Endocrine: [  ]  Excess thirst [  ]  Feeling excessively hot [  ]  Feeling excessively cold Heme: [  x]  Easy bruising [  ]  Enlarged glands or lumps in neck Allergy: [  ]  Food allergies [  ] Environmental allergies  PE: BP 106/68 mmHg  Pulse 78  Resp 14  Ht 5\' 6"  (1.676 m)  Wt 181 lb (82.101 kg)  BMI 29.23 kg/m2  SpO2 96% Wt Readings from Last 3 Encounters:  07/04/14 181 lb (82.101 kg)  07/04/14 181 lb (82.101 kg)  06/19/14 183 lb (83.008 kg)   GENERAL: No acute distress, well developed HEENT:  Eye exam shows normal external appearance. Oral exam shows normal mucosa .  NECK:   Neck exam shows no lymphadenopathy. No Carotids bruits. Thyroid is not enlarged and no nodules felt. no acanthosis nigricans LUNGS:         Chest is symmetrical. Lungs are clear to auscultation.Marland Kitchen   HEART:         Heart sounds:  S1 and S2 are normal. No murmurs or clicks heard. ABDOMEN:  No  Distention present. Liver and spleen are not palpable. No other mass or tenderness present.  EXTREMITIES:     There is some edema. 2+ DP pulses  NEUROLOGICAL:     Grossly intact.            SKIN:       No rash  ASSESSMENT AND PLAN: Problem List Items Addressed This Visit      Cardiovascular and Mediastinum   Essential hypertension    Bp at target on current regimen        Endocrine   Type 2 diabetes mellitus with hyperglycemia - Primary    Discussed with the patient about hyperglycemia and short term risks of uncontrolled DM. Goal A1c would be closer to 8% given his age and comorbidities.   Discussed that given his age and comorbidities, and upcoming surgery will aim to decrease his sugars with insulin therapy, however current therapy with basal insulin is not enough. Would recommend either basal/bolus versus premixed insulin. Discussed pros and cons of both.   Discussed that it would be advisable to regulate his meal times and aim for consistency and low carb foods.  Discussed hypoglycemia recognition and treatment. Discussed insulin pen versus vial use.  He has elected to try basal/bolus regimen.  Decrease levemir to 25 units daily at qhs Start Humalog 8 units qac.  Continue Januvia for now, but will probably consider weaning it off in the next few visits Notify me the sugars in 4 days for further adjustments. Also notify if any lows.       Relevant Medications   insulin lispro (HUMALOG KWIKPEN) 100 UNIT/ML KiwkPen   LEVEMIR 100 UNIT/ML injection     Other   Hyperlipidemia    Managed by cardiology. Last LDL in 2015 was at goal on current therapy         - Return to clinic in 2 weeks with sugar log/meter.  Lucah Petta [facility] 07/05/2014 4:31 PM

## 2014-07-04 NOTE — Progress Notes (Signed)
Subjective:    Patient ID: Shawn Grace., male    DOB: 01-25-1932, 81 y.o.   MRN: [id number]  HPI  Pt presents to the clinic today for surgical clearance. He is planning on having his stent removed April 4th. He went to his urology preop appt and his CBG was 399. His last A1C in 04/2014 was 12.2 %. He has been struggling with his sugars for the last 6 months and has had recurrent infections. He was taken off Metformin secondary to renal insufficiency. He is also on Levemir, 46 units QHS. His sugars range from 130-405. He reports he is not eating a lot of carbs or processed foods. He drinks water and diet soda. He will have a tea occasionally. He does eat popcorn every night before bed. He does not eat candies or sweets. He does have neuropathy which is well controlled with Neurontin. He is on Prednisone for COPD. He is getting yearly eye exams. His Flu, Pneumovax and Prevnar are all UTD.  Review of Systems      Past Medical History  Diagnosis Date  . MI     a. tx'd w/ TPA at [facility] 01/29/1996  . HYPERTENSION   . HYPERLIPIDEMIA   . CAD     a. Myoview 06/2005: EF 50%, scar @ apex, mild peri-infarct ischemia  . Chronic atrial fibrillation     a. since 2006; b. on warfarin  . DM   . History of kidney stones   . Neuropathy of both feet   . Chronic diastolic CHF (congestive heart failure)     a. echo 04/2006: EF lower limits of nl, mod LVH, mild aortic root dilatation, & mild MR, biatrial enlargement; b. echo 04/2013: EF 60%, mod dilated LA, mild MR & TR, mod pulm HTN w/ RV systolic pressure 53, c. echo 04/21/14: EF 55-60%, unable to exclude WMA, severely dilated LA 6.6 cm, nl RVSP, mildly dilated aortic root  . Kidney stone     a. s/p left ureteral stenting 04/24/14  . CVA [id number]    Current Outpatient Prescriptions  Medication Sig Dispense Refill  . acetaminophen (TYLENOL) 650 MG CR tablet Take 650 mg by mouth every 8 (eight) hours as needed.      Marland Kitchen albuterol (2.5 MG/3ML) 0.083% NEBU  3 mL, albuterol (5 MG/ML) 0.5% NEBU 0.5 mL Inhale 1 mg into the lungs.    Marland Kitchen albuterol (PROVENTIL HFA;VENTOLIN HFA) 108 (90 BASE) MCG/ACT inhaler Inhale 2 puffs into the lungs every 6 (six) hours as needed.      . ALPRAZolam (XANAX) 0.25 MG tablet Take 1 tablet (0.25 mg total) by mouth at bedtime as needed for anxiety. 90 tablet 0  . apixaban (ELIQUIS) 5 MG TABS tablet Take 1 tablet (5 mg total) by mouth 2 (two) times daily. 180 tablet 3  . atorvastatin (LIPITOR) 40 MG tablet Take 40 mg by mouth daily.      . bimatoprost (LUMIGAN) 0.03 % ophthalmic solution Place 1 drop into both eyes at bedtime.     . budesonide-formoterol (SYMBICORT) 160-4.5 MCG/ACT inhaler Inhale 2 puffs into the lungs 2 (two) times daily.    . citalopram (CELEXA) 10 MG tablet Take 1 tablet (10 mg total) by mouth daily. 90 tablet 1  . finasteride (PROSCAR) 5 MG tablet Take 5 mg by mouth daily.    . furosemide (LASIX) 40 MG tablet Alternate 40mg  and 20mg  per day.  If your weight is up, then temporarily resume 40mg  each day. 90 tablet  3  . gabapentin (NEURONTIN) 100 MG capsule Take 1 capsule (100 mg total) by mouth 3 (three) times daily. 90 capsule 3  . Insulin Syringe-Needle U-100 (B-D INS SYR MICROFINE .3CC/28G) 28G X 1/2" 0.3 ML MISC 1 Syringe by Does not apply route daily. 100 each 6  . isosorbide dinitrate (ISORDIL) 40 MG tablet Take 40 mg by mouth 3 (three) times daily. Take 20 mg if his BP is over 140/. For anything less than 140/, take nothing.    Marland Kitchen LEVEMIR 100 UNIT/ML injection Inject 0.28 mLs (28 Units total) into the skin at bedtime. Add 2 units per day until AM sugar is <140 Dx E11.65 30 mL 1  . lisinopril (PRINIVIL,ZESTRIL) 10 MG tablet Take 5 mg by mouth daily.    . metoprolol (TOPROL-XL) 50 MG 24 hr tablet Take 50 mg by mouth 2 (two) times daily.     . nitroGLYCERIN (NITROSTAT) 0.4 MG SL tablet Place 0.4 mg under the tongue every 5 (five) minutes as needed.      . nystatin-triamcinolone (MYCOLOG II) cream Apply 1  application topically 2 (two) times daily.     . potassium chloride (K-DUR) 10 MEQ tablet Take 3 tablets (30 mEq total) by mouth daily. 270 tablet 3  . predniSONE (DELTASONE) 10 MG tablet 10mg  a day for 1 week, then back down to 5mg  a day. (Patient taking differently: Take 5 mg by mouth daily with breakfast. 10mg  a day for 1 week, then back down to 5mg  a day.)    . sitaGLIPtin (JANUVIA) 100 MG tablet Take 1 tablet (100 mg total) by mouth daily. 90 tablet 1  . tamsulosin (FLOMAX) 0.4 MG CAPS capsule TAKE 1 CAPSULE DAILY 90 capsule 0  . tiotropium (SPIRIVA) 18 MCG inhalation capsule Place 18 mcg into inhaler and inhale daily.      Marland Kitchen voriconazole (VFEND) 200 MG tablet Take 1 tablet (200 mg total) by mouth 2 (two) times daily. 20 tablet 0   No current facility-administered medications for this visit.    Allergies  Allergen Reactions  . Morphine And Related Other (See Comments)    Hallucinations   . Niacin And Related Dermatitis    Family History  Problem Relation Age of Onset  . Heart disease Maternal Grandmother   . Diabetes Maternal Grandmother   . Cancer Neg Hx   . Stroke Neg Hx   . Heart disease Mother     History   Social History  . Marital Status: Married    Spouse Name: N/A  . Number of Children: N/A  . Years of Education: N/A   Occupational History  . Not on file.   Social History Main Topics  . Smoking status: Former Smoker -- 2.00 packs/day for 40 years    Types: Cigarettes  . Smokeless tobacco: Former Systems developer    Quit date: 05/24/1990  . Alcohol Use: No  . Drug Use: No  . Sexual Activity: Not Currently   Other Topics Concern  . Not on file   Social History Narrative     Constitutional: Denies fever, malaise, fatigue, headache or abrupt weight changes.  HEENT: Pt reports blurred vision. Denies eye pain, eye redness, ear pain, ringing in the ears, wax buildup, runny nose, nasal congestion, bloody nose, or sore throat. Respiratory: Denies difficulty breathing,  shortness of breath, cough or sputum production.   Cardiovascular: Denies chest pain, chest tightness, palpitations or swelling in the hands or feet.  GU: Pt reports frequency. Denies urgency, pain with urination,  burning sensation, blood in urine, odor or discharge. Skin: Denies redness, rashes, lesions or ulcercations.  Neurological: Pt reports numbness in feet. Denies dizziness, difficulty with memory, difficulty with speech or problems with balance and coordination.   No other specific complaints in a complete review of systems (except as listed in HPI above).  Objective:   Physical Exam  BP 110/68 mmHg  Pulse 87  Temp(Src) 97.8 F (36.6 C) (Oral)  Wt 181 lb (82.101 kg)  SpO2 97% Wt Readings from Last 3 Encounters:  07/04/14 181 lb (82.101 kg)  06/19/14 183 lb (83.008 kg)  06/14/14 184 lb 8 oz (83.689 kg)    General: Appears his stated age, chronically ill appearing, in NAD. Skin: Warm, dry and intact. Bruising and multiple skin tears noted. HEENT: Head: normal shape and size; Eyes: sclera white, no icterus, conjunctiva pink, PERRLA and EOMs intact;  Cardiovascular: Normal rate and rhythm. S1,S2 noted.  No murmur, rubs or gallops noted. Trace BLE edema. No carotid bruits noted. Pulmonary/Chest: Normal effort and positive vesicular breath sounds. No respiratory distress. No wheezes, rales or ronchi noted.  Neurological: Alert and oriented. Sensation intact to BLE.  BMET    Component Value Date/Time   NA 136 02/07/2014 1510   K 4.0 02/07/2014 1510   CL 104 02/07/2014 1510   CO2 26 02/07/2014 1510   GLUCOSE 193* 02/07/2014 1510   BUN 29* 02/07/2014 1510   CREATININE 1.4 02/07/2014 1510   CALCIUM 8.9 02/07/2014 1510    Lipid Panel     Component Value Date/Time   CHOL 109 06/22/2013 1416   TRIG 192.0* 06/22/2013 1416   HDL 29.80* 06/22/2013 1416   CHOLHDL 4 06/22/2013 1416   VLDL 38.4 06/22/2013 1416   LDLCALC 41 06/22/2013 1416    CBC    Component Value  Date/Time   WBC 11.7* 05/20/2014 1501   RBC 4.65 05/20/2014 1501   HGB 12.9* 05/20/2014 1501   HCT 38.9* 05/20/2014 1501   PLT 274.0 05/20/2014 1501   MCV 83.5 05/20/2014 1501   MCHC 33.2 05/20/2014 1501   RDW 18.0* 05/20/2014 1501   LYMPHSABS 2.2 05/20/2014 1501   MONOABS 1.3* 05/20/2014 1501   EOSABS 0.1 05/20/2014 1501   BASOSABS 0.0 05/20/2014 1501    Hgb A1C Lab Results  Component Value Date   HGBA1C 9.6* 02/15/2014         Assessment & Plan:  Surgical Clearance:  Unfortunately given his elevated A1C, I can not clear him for surgery. Will repeat his A1C today I am going to have him seen endocrinology ASAP Continue Januvia and Levemir for now Advised him to try cutting out the popcorn Will request your preop notes from you urologist for review  RTC as needed

## 2014-07-04 NOTE — Progress Notes (Signed)
Pre visit review using our clinic review tool, if applicable. No additional management support is needed unless otherwise documented below in the visit note. 

## 2014-07-04 NOTE — Patient Instructions (Signed)
Check sugars 4 x daily ( before each meal and at bedtime).  Record them in a log book and bring that/meter to next appointment.   Check sugars if low as well. Continue januvia for now. Will try to take it off at future visits.   Eat at consistent times. Try to reduce carbs and add veggies.   Decrease levemir to 25 units daily at bedtime.  Start Humalog kwikpen at 8 units three times daily with your meals ( BF, lunch and supper).  Take insulin right before a meal. Make sure that meals are apart by 4 hours. If you skip a meal, then dont take that particular shot of Humalog.   Call in this Monday with your sugar readings for further adjustments.   Please come back for a follow-up appointment in 2 weeks

## 2014-07-04 NOTE — Patient Instructions (Signed)

## 2014-07-05 NOTE — Assessment & Plan Note (Signed)
Managed by cardiology. Last LDL in 2015 was at goal on current therapy

## 2014-07-05 NOTE — Assessment & Plan Note (Signed)
Bp at target on current regimen

## 2014-07-05 NOTE — Assessment & Plan Note (Addendum)
Discussed with the patient about hyperglycemia and short term risks of uncontrolled DM. Goal A1c would be closer to 8% given his age and comorbidities.   Discussed that given his age and comorbidities, and upcoming surgery will aim to decrease his sugars with insulin therapy, however current therapy with basal insulin is not enough. Would recommend either basal/bolus versus premixed insulin. Discussed pros and cons of both.   Discussed that it would be advisable to regulate his meal times and aim for consistency and low carb foods.  Discussed hypoglycemia recognition and treatment. Discussed insulin pen versus vial use.  He has elected to try basal/bolus regimen.  Decrease levemir to 25 units daily at qhs Start Humalog 8 units qac.  Continue Januvia for now, but will probably consider weaning it off in the next few visits Notify me the sugars in 4 days for further adjustments. Also notify if any lows.

## 2014-07-08 ENCOUNTER — Telehealth: Payer: Self-pay | Admitting: Endocrinology

## 2014-07-08 NOTE — Telephone Encounter (Signed)
Patient called with CBG readings all taken fast by 4 hours and 15 Minutes prior to meal or bedtime. 07/04/14 CBG = 265 before breakfast, CBG= 256 15 min prior to lunch, 230 prior to dinner. 359 at bedtime.  07/05/14  CBG= 233, Fasting ,  256 15 min before lunch, 230 15 min prior to dinner , bedtime 359. 07/06/14 CBG = 221 fasting, 13 min prior to  Lunch 382, 15 min prior to dinner 345. Bedtime 330. Fasting today 300 prior to meal. Patient stated he was advised to call in results.

## 2014-07-09 NOTE — Telephone Encounter (Signed)
Pt calling stating that he received a letter from ins company saying that they have tried to contact us but they are not able to get a hold of [place]. Please advise.

## 2014-07-09 NOTE — Telephone Encounter (Signed)
Called patient to notify him of changes to medication per Dr. Boyd Kerbs request. Patient to increase levemir to 28units. Increase humalog to 9 units with breakfast and lunch. Increase humalog to 10 units with dinner. Call on Friday at noon to report sugar readings. Patient verbalized understanding.

## 2014-07-10 ENCOUNTER — Telehealth: Payer: Self-pay | Admitting: *Deleted

## 2014-07-10 NOTE — Telephone Encounter (Signed)
Pt has been approved until 07/10/2015 for Eliquis 5 mg tablet.

## 2014-07-10 NOTE — Telephone Encounter (Signed)
error 

## 2014-07-10 NOTE — Telephone Encounter (Signed)
Spoke with [facility] and pt has been denied twice for medication. I will contact Benefits Coverage help desk to see about appeal. [phone]

## 2014-07-10 NOTE — Telephone Encounter (Signed)
Pt is aware that he has been denied coverage for Eliquis twice. He is aware that we will try to get it appealed and see if we can get him approved for Eliquis.

## 2014-07-10 NOTE — Telephone Encounter (Signed)
Pt wife calling asking how come no one has contacted them about the medication.  She states he only has but two pills left. He may need more samples.

## 2014-07-10 NOTE — Telephone Encounter (Signed)
Noted, thanks!

## 2014-07-12 ENCOUNTER — Telehealth: Payer: Self-pay | Admitting: *Deleted

## 2014-07-12 ENCOUNTER — Encounter
Admit: 2014-07-12 | Disposition: A | Discharge status: Home / Self Care | Payer: Self-pay | Attending: Internal Medicine | Admitting: Internal Medicine

## 2014-07-12 ENCOUNTER — Telehealth: Payer: Self-pay | Admitting: Internal Medicine

## 2014-07-12 NOTE — Telephone Encounter (Signed)
[facility] called and needs clearance for surgery that is scheduled on mon April 4. Please call ashton at [phone].

## 2014-07-12 NOTE — Telephone Encounter (Signed)
Most of his sugars are still high. Recommend cutting back on starchy foods. The prednisone is also probably keeping his sugars up.   Change meal time insulin from 12/19/08 to 12 units with Bf/Lunch/supper ( all three meal times same dose of 12) Continue levemir at 28 units for now >>in another 2 days if sugars are not below 200, then increase levemir to 30 units daily.  Follow up next week as planned.

## 2014-07-12 NOTE — Telephone Encounter (Signed)
He has uncontrolled diabetes by A1c and I can't clear him.

## 2014-07-12 NOTE — Telephone Encounter (Signed)
Pt notified and verbalized understanding.

## 2014-07-12 NOTE — Telephone Encounter (Signed)
Phone call came from Hardy @ [facility] regarding medical clearance for surgery on 07/15/14. Returned call, left VM, notifying Dr. Howell Rucks would not complete medical clearance on patient, would need to come from PCP.

## 2014-07-12 NOTE — Telephone Encounter (Signed)
Glucose readings  3.28.16  Am 300  Lunch 342  Dinner 439  Bedtime 390  3.29.16 Am 191  Lunch 231  Dinner 356  Bedtime 217  3.30.16 Am 161             Lunch  200             Dinner 319         Bedtime 226  3.31.16 Am 152             Lunch 279  Dinner 355  Bedtime 237 4.1.16 Am 198

## 2014-07-12 NOTE — Telephone Encounter (Signed)
[name] with [facility] advised.

## 2014-07-15 ENCOUNTER — Ambulatory Visit
Admit: 2014-07-15 | Disposition: A | Discharge status: Home / Self Care | Payer: Self-pay | Attending: Urology | Admitting: Urology

## 2014-07-16 ENCOUNTER — Ambulatory Visit: Payer: Medicare Other | Admitting: Endocrinology

## 2014-07-17 DIAGNOSIS — N179 Acute kidney failure, unspecified: Secondary | ICD-10-CM | POA: Diagnosis not present

## 2014-07-17 DIAGNOSIS — I69351 Hemiplegia and hemiparesis following cerebral infarction affecting right dominant side: Secondary | ICD-10-CM | POA: Diagnosis not present

## 2014-07-17 DIAGNOSIS — R911 Solitary pulmonary nodule: Secondary | ICD-10-CM | POA: Diagnosis not present

## 2014-07-17 DIAGNOSIS — J441 Chronic obstructive pulmonary disease with (acute) exacerbation: Secondary | ICD-10-CM | POA: Diagnosis not present

## 2014-07-18 ENCOUNTER — Ambulatory Visit (INDEPENDENT_AMBULATORY_CARE_PROVIDER_SITE_OTHER): Payer: Medicare Other | Admitting: Endocrinology

## 2014-07-18 ENCOUNTER — Other Ambulatory Visit: Payer: Self-pay | Admitting: Internal Medicine

## 2014-07-18 ENCOUNTER — Other Ambulatory Visit: Payer: Self-pay | Admitting: *Deleted

## 2014-07-18 ENCOUNTER — Encounter: Payer: Self-pay | Admitting: Endocrinology

## 2014-07-18 VITALS — BP 104/62 | HR 67 | Resp 14 | Ht 66.0 in | Wt 187.0 lb

## 2014-07-18 DIAGNOSIS — E1165 Type 2 diabetes mellitus with hyperglycemia: Secondary | ICD-10-CM

## 2014-07-18 DIAGNOSIS — I10 Essential (primary) hypertension: Secondary | ICD-10-CM | POA: Diagnosis not present

## 2014-07-18 MED ORDER — INSULIN LISPRO 100 UNIT/ML (KWIKPEN): PEN_INJECTOR | SUBCUTANEOUS | Status: DC

## 2014-07-18 NOTE — Patient Instructions (Signed)
Check sugars 4 x daily ( before each meal and at bedtime).  Record them in a log book and bring that/meter to next appointment.   Change levemir to 26 units daily at bedtime. Change Humalog to 11 units BF and 11 units with lunch and continue 9 units with supper.  Donot give Humalog at bedtime.  Donot give Humalog after you have eaten.   Notify me about the sugars stay high or start to develop lows.   Fax me sugars in 1 week for further adjustments.   Please come back for a follow-up appointment in 1 month.

## 2014-07-18 NOTE — Assessment & Plan Note (Signed)
Bp at target on current regimen

## 2014-07-18 NOTE — Progress Notes (Signed)
Pre visit review using our clinic review tool, if applicable. No additional management support is needed unless otherwise documented below in the visit note. 

## 2014-07-18 NOTE — Progress Notes (Signed)
Reason for visit-  [name]. is a 81 y.o.-year-old male, for management of Type 2 diabetes, uncontrolled, with complications ( stage 3 CKD, neuropathy). Associated hx of macrovascular disease (CAD,CHF and stroke). Here with wife, who administers insulin. Last visit 2 weeks ago.  HPI- Patient has been diagnosed with diabetes in 23-25 years~age 69s. Recalls being initially on lifestyle modifications.  Tried  Metformin, Glimeperide, Januvia. he has not been insulin since 2016.    *Off metformin since jan 2016>> got started on levemir after recent hospitalization *Now back on januvia after being off of it >> Back on 100 mg daily for several months ( 100 dose last month)  * On prednsione since 8-9 months , now on 5 mg daily for 8 weeks and plan to continue till next appt>> fu in 2 months ( Dr Raul Del, pulm)   * multiple yeast and UTI recently, recd temporary ureteral stent Feb 2016 after episode of urinary blockage, reports that that stent was infected and had a permanent one put in April 4th. Healing well.    Pt is currently on a regimen of: - januvia 100 mg daily - Levemir 28  units qhs  -Humalog 9 units with each meal( didn't go up as instructed last week)    Last hemoglobin A1c was: Lab Results  Component Value Date   HGBA1C 10.8* 07/04/2014   HGBA1C 9.6* 02/15/2014   HGBA1C 7.9* 06/22/2013     Pt checks his sugars 1 a day . Uses ? glucometer. By log they are:  PREMEAL Breakfast Lunch Dinner Bedtime Overall  Glucose range: 156-265 200-300s 200-300s 144-300s   Mean/median:        POST-MEAL PC Breakfast PC Lunch PC Dinner  Glucose range:     Mean/median:       Hypoglycemia-  No lows. Lowest sugar was n/a; he has hypoglycemia awareness at 70. About 3 months ago got down to 34 and needed EMS assistance  Dietary habits- eats three times daily.  Normally with BF - eggs Lunch - soup/sandwich,/tuna/breast chicken/salad. Variable eating times. Sweet tea once  weekly Supper- hold off on bread, sweet potatotes Exercise- recent stroke this Winter 2015, now with walker>>vison still blurred, home nurse weekly visits, OT, PT done Weight - lost 15 lbs in past 2-3 months>>now gaining it back Wt Readings from Last 3 Encounters:  07/18/14 187 lb (84.823 kg)  07/04/14 181 lb (82.101 kg)  07/04/14 181 lb (82.101 kg)    Diabetes Complications-  Nephropathy- Yes Stage 3  CKD, last BUN/creatinine-   Lab Results  Component Value Date   BUN 29* 02/07/2014   CREATININE 1.4 02/07/2014   Lab Results  Component Value Date   GFR 53.57* 02/07/2014   No components found for: MICRLABCREAT    Retinopathy- No, Last DEE was in , due for repeat exam, but other medical issues recently Neuropathy- has numbness and tingling in )his feet. Known neuropathy. On neurontin  Associated history - CAD s/p stents, CHF, afib . Prior stroke and recent stroke. No hypothyroidism. his last TSH was No results found for: TSH  Hyperlipidemia-  his last set of lipids were- Currently on Lipitor 40. Tolerating well.   Lab Results  Component Value Date   CHOL 109 06/22/2013   HDL 29.80* 06/22/2013   LDLCALC 41 06/22/2013   TRIG 192.0* 06/22/2013   CHOLHDL 4 06/22/2013    Blood Pressure/HTN- Patient's blood pressure is well controlled today on current regimen.    I have reviewed  the patient's past medical history, medications and allergies.   Current Outpatient Prescriptions on File Prior to Visit  Medication Sig Dispense Refill  . acetaminophen (TYLENOL) 650 MG CR tablet Take 650 mg by mouth every 8 (eight) hours as needed.      Marland Kitchen albuterol (2.5 MG/3ML) 0.083% NEBU 3 mL, albuterol (5 MG/ML) 0.5% NEBU 0.5 mL Inhale 1 mg into the lungs.    Marland Kitchen albuterol (PROVENTIL HFA;VENTOLIN HFA) 108 (90 BASE) MCG/ACT inhaler Inhale 2 puffs into the lungs every 6 (six) hours as needed.      . ALPRAZolam (XANAX) 0.25 MG tablet Take 1 tablet (0.25 mg total) by mouth at bedtime as needed for  anxiety. 90 tablet 0  . apixaban (ELIQUIS) 5 MG TABS tablet Take 1 tablet (5 mg total) by mouth 2 (two) times daily. 180 tablet 3  . atorvastatin (LIPITOR) 40 MG tablet Take 40 mg by mouth daily.      . bimatoprost (LUMIGAN) 0.03 % ophthalmic solution Place 1 drop into both eyes at bedtime.     . budesonide-formoterol (SYMBICORT) 160-4.5 MCG/ACT inhaler Inhale 2 puffs into the lungs 2 (two) times daily.    . citalopram (CELEXA) 10 MG tablet Take 1 tablet (10 mg total) by mouth daily. 90 tablet 1  . finasteride (PROSCAR) 5 MG tablet Take 5 mg by mouth daily.    . furosemide (LASIX) 40 MG tablet Alternate 40mg  and 20mg  per day.  If your weight is up, then temporarily resume 40mg  each day. 90 tablet 3  . gabapentin (NEURONTIN) 100 MG capsule Take 1 capsule (100 mg total) by mouth 3 (three) times daily. 270 capsule 1  . insulin lispro (HUMALOG KWIKPEN) 100 UNIT/ML KiwkPen Inject 0.08 mLs (8 Units total) into the skin 3 (three) times daily. With meals (Patient taking differently: Inject 9 Units into the skin 3 (three) times daily. With meals) 15 mL 3  . Insulin Pen Needle 32G X 4 MM MISC Use three times daily for insulin admin 100 each 3  . LEVEMIR 100 UNIT/ML injection Inject 0.25 mLs (25 Units total) into the skin at bedtime. Dx E11.65 (Patient taking differently: Inject 28 Units into the skin at bedtime. Dx E11.65) 30 mL 1  . metoprolol (TOPROL-XL) 50 MG 24 hr tablet Take 50 mg by mouth 2 (two) times daily.     . nitroGLYCERIN (NITROSTAT) 0.4 MG SL tablet Place 0.4 mg under the tongue every 5 (five) minutes as needed.      . nystatin-triamcinolone (MYCOLOG II) cream Apply 1 application topically 2 (two) times daily.     . potassium chloride (K-DUR) 10 MEQ tablet Take 3 tablets (30 mEq total) by mouth daily. 270 tablet 3  . predniSONE (DELTASONE) 10 MG tablet 10mg  a day for 1 week, then back down to 5mg  a day. (Patient taking differently: Take 5 mg by mouth daily with breakfast. )    . sitaGLIPtin  (JANUVIA) 100 MG tablet Take 1 tablet (100 mg total) by mouth daily. 90 tablet 1  . tamsulosin (FLOMAX) 0.4 MG CAPS capsule TAKE 1 CAPSULE DAILY 90 capsule 0  . tiotropium (SPIRIVA) 18 MCG inhalation capsule Place 18 mcg into inhaler and inhale daily.       No current facility-administered medications on file prior to visit.   Allergies  Allergen Reactions  . Morphine And Related Other (See Comments)    Hallucinations   . Niacin And Related Dermatitis     Review of Systems- [ x ]  Complains  of    [  ]  denies [ x ] Recent weight change [  ]  Fatigue [  ] polydipsia [  ] polyuria  [  ]  vision difficulty [  ] chest pain [  ] shortness of breath  [  ] cough [  ] nausea/vomiting [  ] diarrhea [  ] constipation [  ] abdominal pain [  ]  tingling/numbness in extremities [  ]  concern with feet ( wounds/sores)   PE: BP 104/62 mmHg  Pulse 67  Resp 14  Ht 5\' 6"  (1.676 m)  Wt 187 lb (84.823 kg)  BMI 30.20 kg/m2  SpO2 97% Wt Readings from Last 3 Encounters:  07/18/14 187 lb (84.823 kg)  07/04/14 181 lb (82.101 kg)  07/04/14 181 lb (82.101 kg)   Exam: deferred  ASSESSMENT AND PLAN: Problem List Items Addressed This Visit      Cardiovascular and Mediastinum   Essential hypertension    Bp at target on current regimen          Endocrine   Type 2 diabetes mellitus with hyperglycemia - Primary    Goal A1c would be closer to 8% given his age and comorbidities.   His sugars are improving after recent insulin adjustments ( made in between clinic appts) and after infected stent removal. Day time numbers are still elevated, likely from chronic prednisone use.  Change levemir to 26 units daily at bedtime. Change humalog to 11 BF/11 Lunch/9 supper Send me sugars in 1 week for further adjustments.   Continue Januvia for now, but will probably consider weaning it off in the next few visits            - Return to clinic in 4 weeks with sugar log/meter.  Nickisha Hum  [facility] 07/18/2014 1:34 PM

## 2014-07-18 NOTE — Assessment & Plan Note (Signed)
Goal A1c would be closer to 8% given his age and comorbidities.   His sugars are improving after recent insulin adjustments ( made in between clinic appts) and after infected stent removal. Day time numbers are still elevated, likely from chronic prednisone use.  Change levemir to 26 units daily at bedtime. Change humalog to 11 BF/11 Lunch/9 supper Send me sugars in 1 week for further adjustments.   Continue Januvia for now, but will probably consider weaning it off in the next few visits

## 2014-07-19 ENCOUNTER — Other Ambulatory Visit: Payer: Self-pay

## 2014-07-19 MED ORDER — "INSULIN SYRINGE-NEEDLE U-100 30G X 1/2"" 1 ML MISC": 1.0000 | Freq: Three times a day (TID) | Status: DC

## 2014-07-24 ENCOUNTER — Telehealth: Payer: Self-pay

## 2014-07-24 NOTE — Telephone Encounter (Signed)
We have received from multiple diabetic supply companies--want to confirm which one pt is using---[employer]

## 2014-07-24 NOTE — Telephone Encounter (Signed)
Pt states he is not using any of these companies--he is using [place] [employer]

## 2014-07-29 ENCOUNTER — Telehealth: Payer: Self-pay | Admitting: Internal Medicine

## 2014-07-29 ENCOUNTER — Telehealth: Payer: Self-pay

## 2014-07-29 NOTE — Telephone Encounter (Signed)
Pt returned your call. Please return call. Thanks.

## 2014-07-29 NOTE — Telephone Encounter (Signed)
PLEASE NOTE: All timestamps contained within this report are represented as [place] Standard Time. CONFIDENTIALTY NOTICE: This fax transmission is intended only for the addressee. It contains information that is legally privileged, confidential or otherwise protected from use or disclosure. If you are not the intended recipient, you are strictly prohibited from reviewing, disclosing, copying using or disseminating any of this information or taking any action in reliance on or regarding this information. If you have received this fax in error, please notify us immediately by telephone so that we can arrange for its return to [place]. Phone: [phone], Toll-Free: [phone], Fax: [phone] Page: 1 of 2 Call Id: [id number] [place] Patient Name: [name] Gender: Male DOB: 09/06/1931 Age: 81 Y 44 M 12 D Return Phone Number: [phone] (Primary) Address: [address] City/State/Zip: [place] [address] Client [facility] - Client Client Site [place] - Night Physician Webb Silversmith Contact Type Call Call Type Triage / Clinical Relationship To Patient Self Return Phone Number [phone] (Primary) Chief Complaint Prescription Refill or Medication Request (non symptomatic) Initial Comment Caller States needs a refill on his medication Nurse Assessment Nurse: Markus Daft, RN, Sherre Poot Date/Time (Eastern Time): 07/27/2014 11:11:47 AM Please select the assessment type ---Refill Additional Documentation ---Caller states needs a refill on his medication Januvia 100 mg one daily. He took his last dose today. He takes insulin along with this. Does the patient have enough medication to last until the office opens? ---Unable to obtain loaner dose from Pharmacy Does the client directives allow for assistance with medications after hours? ---Yes Was  the medication filled within the last 6 months? ---Yes What is the name of the medication, dose and instructions as listed on the bottle? ---[place] Name of the physician as listed on the bottle. ---Dr. Garnette Gunner Additional Documentation ---RN advised that caller contact the pharmacy and ask for emergency supply til the office reopens. Caller verb. understanding. Guidelines Guideline Title Affirmed Question Affirmed Notes Nurse Date/Time (Eastern Time) Disp. Time Eilene Ghazi Time) Disposition Final User 07/27/2014 11:15:24 AM Send To RN Personal Markus Daft, RN, Windy 07/27/2014 11:42:19 AM Clinical Call Yes Markus Daft, RN, Sherre Poot After Care Instructions Given Call Event Type User Date / Time Description PLEASE NOTE: All timestamps contained within this report are represented as [place] Standard Time. CONFIDENTIALTY NOTICE: This fax transmission is intended only for the addressee. It contains information that is legally privileged, confidential or otherwise protected from use or disclosure. If you are not the intended recipient, you are strictly prohibited from reviewing, disclosing, copying using or disseminating any of this information or taking any action in reliance on or regarding this information. If you have received this fax in error, please notify us immediately by telephone so that we can arrange for its return to [place]. Phone: [phone], Toll-Free: [phone], Fax: [phone] Page: 2 of 2 Call Id: [id number]

## 2014-07-29 NOTE — Telephone Encounter (Signed)
PLEASE NOTE: All timestamps contained within this report are represented as [place] Standard Time. CONFIDENTIALTY NOTICE: This fax transmission is intended only for the addressee. It contains information that is legally privileged, confidential or otherwise protected from use or disclosure. If you are not the intended recipient, you are strictly prohibited from reviewing, disclosing, copying using or disseminating any of this information or taking any action in reliance on or regarding this information. If you have received this fax in error, please notify us immediately by telephone so that we can arrange for its return to [place]. Phone: [phone], Toll-Free: [phone], Fax: [phone] Page: 1 of 1 Call Id: [id number] [place] Patient Name: [name] Gender: Male DOB: Nov 29, 1931 Age: 81 Y 47 M 11 D Return Phone Number: [phone] (Primary) Address: [address] City/State/Zip: [place] [address] Client [facility] - Client Client Site [place] - Night Physician Webb Silversmith Contact Type Call Call Type Triage / Clinical Relationship To Patient Self Return Phone Number [phone] (Primary) Chief Complaint Prescription Refill or Medication Request (non symptomatic) Initial Comment Caller states he is out of his Rx and needs a refill.[name] Gamble 13ml Nurse Assessment Nurse: Einar Gip, RN, Neoma Laming Date/Time Eilene Ghazi Time): 07/26/2014 7:16:29 PM Confirm and document reason for call. If symptomatic, describe symptoms. ---Caller states he needs prescription for Juniva. States he had gotten some samples but has never had a prescription filled at a pharmacy. Advised to contact the office on Monday for a prescription. Advised if he needs the medication prior to Monday he will have to go somewhere to be seen . Caller wants to know if he  can substitute Meformin- states he took them before but they took him off of the medication because of his kidneys. Advised I can't recommend that he take the Metformin and advised him to contact the office on Monday. Caller verbalized understanding. Has the patient traveled out of the country within the last 30 days? ---Not Applicable Does the patient require triage? ---No Guidelines Guideline Title Affirmed Question Affirmed Notes Nurse Date/Time (Eastern Time) Disp. Time Eilene Ghazi Time) Disposition Final User 07/26/2014 7:21:00 PM Clinical Call Yes Einar Gip, RN, Neoma Laming After Care Instructions Given Call Event Type User Date / Time Description

## 2014-07-29 NOTE — Telephone Encounter (Signed)
Pt left v/m returning call and request cb. 

## 2014-07-29 NOTE — Telephone Encounter (Signed)
Left message on voicemail.

## 2014-07-29 NOTE — Telephone Encounter (Addendum)
Pt left v/m requesting cb about Januvia. Pt is out of med and request 30 day supply to [facility] and [facility].

## 2014-07-29 NOTE — Telephone Encounter (Signed)
Mel- can you take care of this?

## 2014-07-29 NOTE — Telephone Encounter (Signed)
[facility] for [facility] with 1 refill--pt should have medication or he needs to [facility]

## 2014-07-30 MED ORDER — SITAGLIPTIN PHOSPHATE 100 MG PO TABS: 100.0000 mg | ORAL_TABLET | Freq: Every day | ORAL | Status: DC

## 2014-07-30 NOTE — Discharge Summary (Signed)
PATIENT NAME:  [name], [name] MR#:  [id number] DATE OF BIRTH:  [birth year]  DATE OF ADMISSION:  01/24/2012 DATE OF DISCHARGE:  01/26/2012  ADMITTING PHYSICIAN: Fritzi Mandes, MD  DISCHARGING PHYSICIAN: Gladstone Lighter, MD  PRIMARY CARE PHYSICIAN: Cletis Athens, MD  CONSULTANTS: None.  DISCHARGE DIAGNOSES:  1. Systemic inflammatory response syndrome.  2. Acute cystitis.  3. Morganella Morgagni urinary tract infection.  4. Obstructive sleep apnea.  5. Chronic obstructive pulmonary disease. 6. Hypertension.  7. Diabetes mellitus.  8. Chronic atrial fibrillation, on Coumadin.  DISCHARGE MEDICATIONS:  1. Isosorbide dinitrate 40 mg p.o. three times daily.  2. Lisinopril 40 mg p.o. daily.  3. Celexa 10 mg p.o. daily.  4. Januvia 100 mg p.o. daily.  5. Albuterol inhaler 2 puffs every six hours p.r.n.  6. Tylenol Arthritis 650 mg every eight hours p.r.n. for pain.  7. Aspirin 81 mg p.o. daily.  8. Atorvastatin 40 mg p.o. at bedtime.  9. Bimatoprost 0.03% ophthalmic solution one drop to both eyes in the evening.  10. Lasix 20 mg once a day.  11. Fish oil capsule 1 capsule three times daily. 12. Coumadin 4 mg p.o. daily.  13. Advair 115/21 mcg inhaler 2 puffs twice a day.  14. Glimepiride 4 mg twice a day.  15. KCl 10 mEq 2 tablets once and 3 tablets the other day.  16. Metformin 1000 mg p.o. twice a day. 17. Nasonex nasal spray two sprays once a day.  18. Nitroglycerin sublingual tablet as needed for chest pain.  19. Toprol-XL 50 mg p.o. twice a day.  20. Spiriva capsule daily. 21. Levaquin 500 mg p.o. daily for seven days.   DISCHARGE DIET: Low sodium, carbohydrate -controlled diet.Marland Kitchen   DISCHARGE ACTIVITY: As tolerated.   FOLLOWUP INSTRUCTIONS: PCP followup in 1 to 2 weeks. INR check in the next 4 to 5 days as the Levaquin might interfere with the levels.  LABS AND IMAGING STUDIES: WBC at the time of discharge is 10.0, hemoglobin is 14.7, hematocrit 43.0, and platelet count  189. WBC on admission was 21.9.   Sodium 138, potassium 3.6, chloride 105, bicarbonate 23, BUN 14, creatinine 1.04, glucose 211, and calcium 8.9.   ALT 19, AST 6, albumin 3.2, alkaline phosphatase 79, and total bilirubin 1.1. Troponins were negative. INR at the time of discharge is 1.9.   Blood cultures remained negative.   Urinalysis with 3+ leukocyte esterase, 177 WBCs and 3+ bacteria.   Urine culture is growing greater than 100,000 colonies of Morganella morganii, which is sensitive to Rocephin, Levaquin and also Bactrim.   CT of the head done on admission is showing no acute intracranial process.  Chest x-ray is showing bilateral diffuse interstitial thickening, interstitial pneumonitis versus edema.   BRIEF HOSPITAL COURSE: Mr. Sallade is a 81 year old Caucasian male with past medical history significant for hypertension, diabetes, chronic obstructive pulmonary disease, and obstructive sleep apnea on CPAP at nighttime who was admitted to the hospital secondary to fever, chills, and also dysuria. He was found to be febrile, tachycardic and had elevated white count.  1. Systemic inflammatory response syndrome secondary to acute cystitis for Morganella urinary tract infection based on cultures. He was started on Rocephin. He has been fever free while in the hospital and his white count improved to 10,000, within normal limits. His cultures were growing Morganella morganii urinary tract infection, which is sensitive to Bactrim and also Levaquin, so he is being discharged on Levaquin for outpatient treatment.  2. Chronic obstructive pulmonary disease  and obstructive sleep apnea, appeared stable without any wheezing. He uses CPAP at home. His home medications of Advair and albuterol were continued without any changes and he is also on Spiriva.  3. Diabetes mellitus. He is on Januvia, glimepiride and metformin which are being continued at this time.  4. Hypertension. He is on Toprol, lisinopril,  Isordil, and also low dose Lasix.  5. Chronic atrial fibrillation, on Coumadin. INR is 1.9. He can follow up as an outpatient. He is advised to get INR check in the next 4 to 5 days while on Levaquin as it might interfere with INR. His course has been otherwise uneventful in the hospital.   DISCHARGE CONDITION: Stable.   DISCHARGE DISPOSITION: Home.   TIME SPENT ON DISCHARGE: 45 minutes. ____________________________ Gladstone Lighter, MD rk:slb D: 01/26/2012 13:45:00 ET T: 01/27/2012 11:24:25 ET JOB#: [id number]  cc: Gladstone Lighter, MD, <Dictator> Cletis Athens, MD Gladstone Lighter MD ELECTRONICALLY SIGNED 02/01/2012 14:05

## 2014-07-30 NOTE — Telephone Encounter (Signed)
90 day sent mail order and 30 day sent local--spoke to and he is aware---there was Rx sent to mail order 05/2014 #90 with 1 refill-pt states he never received mail order--advised pt to let me know if there are anymore problems as i have refilled to both mail order and local pharmacies

## 2014-07-30 NOTE — Addendum Note (Signed)
Addended by: Lurlean Nanny on: 07/30/2014 09:06 AM   Modules accepted: Orders

## 2014-07-30 NOTE — H&P (Signed)
PATIENT NAME:  [name], [name] MR#:  [id number] DATE OF BIRTH:  [birth year]  DATE OF ADMISSION:  01/24/2012  PRIMARY CARE PHYSICIAN: Dr. Lavera Guise   CHIEF COMPLAINT: Fever, chills and foul-smelling urine.   HISTORY OF PRESENT ILLNESS: Mr. Victory is a very pleasant 81 year old Caucasian gentleman with past medical history of hypertension, type 2 diabetes, chronic obstructive pulmonary disease and obstructive sleep apnea, comes to the Emergency Room accompanied by his family members with the above-mentioned chief complaint. Patient says past two to three days he has been having fever and chills at home associated with foul-smelling urine. He took some Tylenol, broke with the fever yesterday. His wife recorded a temperature of 104, got them worried, came to the Emergency Room, had fever of 100.2 and earlier was 100.6 by EMS. He was tachycardic, heart rate in the 100s, blood pressure was 180/95 on arrival. He is currently in atrial fibrillation with RVR with heart rate in the 80s. Patient was found to have urinary tract infection/acute cystitis on evaluation. His chest x-ray is unremarkable. CT head is negative. He has a white count of 21,000. He is being admitted for systemic inflammatory response syndrome secondary to acute cystitis/urinary tract infection.   PAST MEDICAL HISTORY:  1. History of chronic obstructive pulmonary disease, chronic, stable.  2. History of CVA with left-sided weakness in 1999.  3. Chronic atrial fibrillation, on Coumadin.  4. Hyperlipidemia.  5. Hypertension.  6. Type 2 diabetes.  7. Coronary artery disease with two stents in the past.   MEDICATIONS:  1. Tylenol arthritis 1 caplet every eight hours as needed.  2. Toprol-XL 50 mg b.i.d.  3. Spiriva 1 capsule inhalation daily.  4. Nitroglycerin 0.4 mg sublingual as needed.  5. Nasonex 50 mcg/inhalations 2 sprays once a day.  6. Metformin 1000 mg 1 tablet b.i.d.  7. Lisinopril 40 mg daily.  8. Lipitor 40 mg p.o. daily at  bedtime.  9. K-Dur 10 mEq 2 tablets once a day and 3 tablets next day.  10. Januvia 100 mg daily.  11. Isosorbide dinitrate 40 mg 1 tablet 3 times a day.  12. Glimepiride 2 mg 2 tablets twice a day.  13. Furosemide 20 mg daily.  14. Fish oil 1000 mg 1 capsule 3 times a day.  15. Coumadin 2 mg 2 tablets daily.  16. Citalopram 10 mg daily.  17. Bimatoprost 0.03% one drop to affected eyes in the evening.  18. Atorvastatin 40 mg daily at bedtime.  19. Aspirin 81 mg daily.  20. Albuterol 2 puffs every six hours.  21. Advair HFA 115/21, 2 puffs b.i.d.   ALLERGIES: Contrast dye, morphine and niacin.   FAMILY HISTORY: Positive for hypertension.   REVIEW OF SYSTEMS: CONSTITUTIONAL: Positive for fever, fatigue, and weakness. EYES: No blurred or double vision. ENT: No tinnitus, ear pain, hearing loss. RESPIRATORY: No cough, wheeze, hemoptysis. Chronic obstructive pulmonary disease, stable. CARDIOVASCULAR: No chest pain, orthopnea, edema or palpitations. Positive for atrial fibrillation. GASTROINTESTINAL: No nausea, vomiting, diarrhea, abdominal pain or gastroesophageal reflux disease. GENITOURINARY: Positive for dysuria, frequency and urinary discoloration. ENDOCRINE: No polyuria, nocturia, thyroid problems. HEMATOLOGY: No anemia or easy bruising. SKIN: No acne, rash. MUSCULOSKELETAL: Positive for arthritis. NEUROLOGIC: No cerebrovascular accident, transient ischemic attack. Patient has some chronic weakness left lower extremity from old stroke. PSYCH: No anxiety or depression. All other systems reviewed and negative.   SOCIAL HISTORY: Patient lives at home with his wife, nonsmoker, nonalcoholic.   LABORATORY, DIAGNOSTIC AND RADIOLOGICAL DATA: EKG shows chronic atrial  fibrillation. Heart rate in the 100s. Urinalysis positive for urinary tract infection. CT of the head is negative. White count 21,000. Comprehensive metabolic panel within normal limits except glucose of 211, bilirubin of 1.1, albumin of  3.2. Cardiac enzymes, first set negative. PT-INR pending. Chest x-ray per Emergency Room physician unremarkable.   ASSESSMENT: 81 year old Mr. Plog with history of hypertension, type 2 diabetes, chronic atrial fibrillation on Coumadin comes in with:  1. Systemic inflammatory response syndrome due to acute cystitis. He presented with fever of 100.6 for last couple of days, tachycardic with atrial fibrillation, rate 100-110, leukocytosis of 21,000. Patient received a dose of IV Rocephin in the Emergency Room. Will admit on regular floor with off unit telemetry. Follow blood cultures, urine cultures. Continue IV Rocephin. Change to oral antibiotics once sensitivities obtained. Follow up CBC in the morning.  2. Acute cystitis. Patient presented with follow foul-smelling urine, dysuria, fever, leukocytosis. Treatment as above.  3. Acute on chronic atrial fibrillation, in the setting of infection, heart rate in the 100s. Continue beta blockers. Patient on Coumadin, will give his home dose tonight. His INR was 2.3 about a week ago. Will check another PT-INR given patient being on antibiotics.  4. Type 2 diabetes. Continue glimepiride, metformin, Januvia and add sliding scale insulin.  5. Hypertension. Continue home medications, which is Imdur, beta blockers and lisinopril.  6. Chronic obstructive pulmonary disease, stable.  7. Obstructive sleep apnea on CPAP.  8. Further work-up according to patient's clinical course. Hospital admission plan was discussed with patient and his family members.   TIME SPENT: 50 minutes.   CODE STATUS: Patient is a FULL CODE.   ____________________________ Hart Rochester. Posey Pronto, MD sap:cms D: 01/24/2012 23:43:41 ET T: 01/25/2012 06:32:47 ET JOB#: [id number]  cc: Frenchie Pribyl A. Posey Pronto, MD, <Dictator> Cletis Athens, MD Ilda Basset MD ELECTRONICALLY SIGNED 02/02/2012 11:25

## 2014-07-30 NOTE — Addendum Note (Signed)
Addended by: Lurlean Nanny on: 07/30/2014 11:22 AM   Modules accepted: Orders, Medications

## 2014-07-31 ENCOUNTER — Telehealth: Payer: Self-pay | Admitting: *Deleted

## 2014-07-31 NOTE — Telephone Encounter (Signed)
Spoke with pt, advised him of MDs message and dosage changes.  Pt verbalized understanding by reading back instructions.

## 2014-07-31 NOTE — Telephone Encounter (Signed)
Please let him know that I have reviewed his recent sugars log-   BF sugars mostly at target- 156-196  All through the day they are higher-  Lunch ( 178-305)  Supper( 218-317)  Qhs( 184-366)   Please ask him to continue levemir at 26 units qhs.  Change Humalog from 11BF/11lunch/9 supper to 14 BF/14 lunch and 10 at supper.   Send me sugars in 1-2 weeks for further adjustments. Sooner if lows.  Record info in telephone note please.  thanks

## 2014-07-31 NOTE — Telephone Encounter (Signed)
Error duplicate encounter

## 2014-07-31 NOTE — Telephone Encounter (Signed)
-----   Message from Haydee Monica, MD sent at 07/31/2014 11:03 AM EDT ----- Regarding: call patient with insulin adjustments Please let him know that I have reviewed his recent sugars log-  BF sugars mostly at target- 156-196 All through the day they are higher- Lunch ( 178-305) Supper( 218-317) Qhs( 184-366)  Please ask him to continue levemir at 26 units qhs. Change Humalog from 11BF/11lunch/9 supper to 14 BF/14 lunch and 10 at supper.  Send me sugars in 1-2 weeks for further adjustments. Sooner if lows.  Record info in telephone note please. thanks

## 2014-07-31 NOTE — Telephone Encounter (Signed)
The insulin changes and my prior message to you is not showing up in this telephone note. Please could you insert it here, so that we can keep track of changes. thanks

## 2014-08-03 NOTE — Discharge Summary (Signed)
PATIENT NAME:  [name], [name] MR#:  [id number] DATE OF BIRTH:  [birth year]  DATE OF ADMISSION:  04/24/2013 DATE OF DISCHARGE:  04/26/2013  ADMITTING DIAGNOSIS: Congestive heart failure.   DISCHARGE DIAGNOSES: 1.  Acute diastolic congestive heart failure.  2.  Acute pulmonary edema.  3.  Lower extremity swelling due to congestive heart failure.  4.  Atrial fibrillation, rapid ventricular response.  5.  Chronic obstructive pulmonary disease exacerbation.  6.  Acute bronchitis and malignant hypertension.  7.  History of stroke with left-sided weakness in the past.  8.  Atrial fibrillation on Coumadin therapy with INR of 2.3 on 04/26/2013  8.  Hyperlipidemia.  9.  Hypertension.  10.  Coronary artery disease.  11.  Diabetes mellitus type 2.   DISCHARGE CONDITION: Stable.   DISCHARGE MEDICATIONS: The patient is to continue citalopram 10 mg p.o. daily, Januvia 100 mg p.o. daily, aspirin 81 mg p.o. daily, atorvastatin 40 mg p.o. at bedtime, Nasonex two sprays once daily, Tylenol 750 mg every eight hours as needed, albuterol 2 puffs every six hours as needed,  Lumigan 0.03% ophthalmic solution one drop each eye at bedtime, Symbicort 160/4.5, 2 puffs twice daily, glimepiride 4 mg p.o. once daily, isosorbide dinitrate 40 mg 3 times daily, metformin 1 gram twice daily with meals, metoprolol succinate 100 mg p.o. in the morning and 50 mg p.o. at bedtime, nitroglycerin 0.4 mg sublingually every five minutes as needed, potassium chloride 20 mEq daily alternating with 30 mEq every second day, Spiriva 18 mcg 1 inhalation once daily, warfarin 4 mg p.o. daily, fluocinonide topical cream 0.05% to 3 times daily, prednisone 30 mg p.o. once on the 04/27/2013 then taper x 10 mg until stopped, lisinopril 40 mg p.o. twice daily, this is a new dose, albuterol ipratropium nebulizer 2.5/0.5 mg and  3 mL inhalation solution, 1 inhalation 6 times daily as needed, furosemide 20 mg p.o. twice daily, azithromycin 250 mg p.o.  once daily for three more days. Prescriptions were sent to [facility].   HOME OXYGEN: None.   DIET: 2 grams salt, low-fat, low-cholesterol, carbohydrate -controlled diet, regular consistency.   ACTIVITY LIMITATIONS: As tolerated.    FOLLOWUP APPOINTMENT: With Dr. Lavera Guise in two days after discharge. The patient was also advised to have his pro-time, potassium level as well as kidney function checked in the next 3 to 4 days to insure stability.    CONSULTANTS: Care management, social work.   RADIOLOGIC STUDIES: Chest x-ray, portable single view, 04/24/2013 revealed cardiac enlargement, increased interstitial lung markings which may be due to chronic lung disease. Superimposed mild fluid overload was not excluded.   Doppler of lower extremities 04/24/2013, showed no evidence of lower extremity deep vein thrombosis, bilateral small Baker's cysts were noted.   Echocardiogram, 04/24/2013, revealed left ventricular ejection fraction by visual estimation of 60% to 65%, normal global left ventricular systolic function. borderline left ventricular hypertrophy, mildly dilated right ventricle and mildly reduced systolic function, moderately dilated left atrial mildly dilated right atrial, mild mitral valve regurgitation, mild tricuspid regurgitation, moderately elevated pulmonary arterial systolic pressure.   HOSPITAL COURSE:  The patient is 81 year old Caucasian male with history of chronic obstructive pulmonary, who presented to the hospital with complaints of shortness of breath. Please refer to Dr. Keenan Bachelor  note on 04/24/2013. He was also complaining of phlegm which was whitish-grayish in color. He felt feverish and his temperature was in low 100s. He had a few courses of antibiotic therapy with no significant improvement. On arrival to the  Emergency Room, he was noted to have significant lower extremity swelling and crackles in his lungs. He was admitted to the hospital for further evaluation and was  started on Lasix IV. His blood pressure was noted to be elevated and he is medications were advanced. Nitroglycerin topically was added. The patient's admission labs revealed sodium level of 132, BUN of 20, glucose 189 . Beta-type natriuretic peptide was 4129. The patient's liver enzymes revealed albumin level of 3.2; otherwise, liver enzymes were normal. Cardiac enzymes were unremarkable x 4. The patient's white blood cell count was elevated to 17.1, hemoglobin was 15.9, platelet count was 257. Coagulation panel revealed pro-time of 21.9 and INR was 2.0. Blood cultures taken on the day of admission, 04/24/2013 did not show any growth. Influenza testing was negative. Prostate specific antigen was 4.1, procalcitonin level was 0.07. The patient's venous pH 7.41, pCO2 was 37. The patient was admitted to the hospital for further evaluation. As mentioned above, his blood pressure medications were advanced to improve his blood pressure and diuretics were initiated IV. With this, his condition improved. He was also initiated on Rocephin and Zithromax for acute bronchitis. By 04/26/2013, he diuresed some and he him felt much more comfortable. His lower extremity swelling subsided. He was advised to continue diuretics twice daily. As well as potassium supplements; however, since his ACE inhibitor was advanced to a twice daily dose, it was felt that the patient's potassium level as well as creatinine level should be checked very closely. The patient was also to continue antibiotics as well as steroid taper and inhalation therapy at home; however, it was felt that acute bronchitis very likely played less a role in his shortness of breath. The patient was advised to continue follow-up with his primary care physician for further recommendations. Echocardiogram was performed and it revealed normal ejection fraction and elevated right-sided pressures. In regards to atrial fibrillation with RVR, the patient was continued on  metoprolol. Digoxin was added for heart rate control while he was in the hospital; however, digoxin was discontinued upon discharge. It is recommended to initiate the patient on digoxin if his heart rate is not improving on current therapy. In regards to malignant hypertension, as mentioned above, the patient is to continue metoprolol as well as advanced doses of lisinopril. Blood pressure is well controlled. By the day of discharge, the patient's blood pressure is 118/67. Oxygen saturation was 97% on room air at rest and 90% to 92% on room air on exertion. His heart rate remained stable in the 60s to 80s and he was afebrile. In regards to of his atrial fibrillation, Coumadin therapy, the patient's INR was checked while he was in the hospital, INR was 2.3 on 04/26/2013. It is recommended to follow the patient's pro-time as outpatient to ensure its stability on current therapy. For history of hyperlipidemia, hypertension, diabetes mellitus, coronary artery disease, the patient is to continue his outpatient management. No changes were made here. The patient is being discharged in stable condition with the above-mentioned indications and follow-up.   TIME SPENT:  40  minutes.    ____________________________ Theodoro Grist, MD rv:cc D: 04/26/2013 16:17:03 ET T: 04/26/2013 21:32:44 ET JOB#: [id number]  cc: Theodoro Grist, MD, <Dictator> Cletis Athens, MD Jamarquis Crull MD ELECTRONICALLY SIGNED 05/04/2013 14:04

## 2014-08-03 NOTE — Consult Note (Signed)
PATIENT NAME:  [name], [name] MR#:  [id number] DATE OF BIRTH:  [birth year]  DATE OF CONSULTATION:  05/07/2013  REFERRING PHYSICIAN: Dr. Benjie Karvonen.  CONSULTING PHYSICIAN:  Scott C. Stoioff, MD  REASON FOR CONSULTATION: Left ureteral calculus and fever.   CHIEF COMPLAINT: Abdominal pain.   HISTORY OF PRESENT ILLNESS: This 81 year old male presented to the Emergency Department on 05/03/2013 complaining of lower abdominal pain, nausea and vomiting. A noncontrast CT of the abdomen and pelvis was performed, which showed a 4 mm left mid ureteral calculus with moderate hydronephrosis and hydroureter. He also was noted to have a nonobstructing left renal calculus. He was discharged with oral analgesics and tamsulosin. Over the last several days, he has had intermittent pain and yesterday he had a temperature to 101 with chills and went back to the Emergency Department. He states he was diagnosed with a kidney stone in 1972 and has had no problems until this month. He has mild to moderate lower urinary tract symptoms. He had no fever on arrival to the Emergency Department and has been afebrile since his admission. Admission urinalysis was remarkable for pyuria.   PAST MEDICAL HISTORY:  1.  COPD.  2.  History of CVA.  3.  Chronic atrial fibrillation.  4.  Hyperlipidemia.  5.  Hypertension.  6.  Diabetes.  7.  Coronary artery disease.  8.  Diastolic heart failure with preserved ejection fraction.   MEDICATIONS ON ADMISSION: As per the medication reconciliation.   ALLERGIES: INTRAVENOUS CONTRAST, MORPHINE AND NIACIN.   SOCIAL HISTORY: Prior tobacco use, none since 38.   PAST SURGICAL HISTORY: Coronary artery stent placement.   PHYSICAL EXAMINATION:  VITAL SIGNS: Temperature 98, BP 135/82, pulse 72.  GENERAL: The patient is in no acute distress.  ABDOMEN: Protuberant, soft and nontender.  BACK: No CVA tenderness.   DATA: Urinalysis: Yellow, cloudy, 3+ blood, 3+ leukocyte. Micro 475 WBCs and 75  RBCs. CT scan performed 05/03/2013 remarkable for an approximately 4 mm stone in the lower portion of the proximal ureter. Large nonobstructing left renal calculus is present.   IMPRESSION:  1.  A left ureteral calculus with moderate hydronephrosis/hydroureter.  2.  Fever, pyuria.   RECOMMENDATION: It was explained to the patient and his family that attempts to remove the stone are contraindicated due to ongoing infection and that a drainage procedure is needed. In septic patient's a percutaneous nephrostomy is the ideal way; however, he looks good clinically and is presently afebrile. He is also on Coumadin and could not have nephrostomy tube until his INR normalizes. We will proceed with placement of a left ureteral stent. The procedure was explained to the patient and his wife. Potential risks were discussed including bleeding, infection and sepsis. They were informed he will need a procedure for treatment of the stone in the near future if he does not pass the stone. The potential for inability to place a stent secondary to an impacted stone was discussed, which would then require percutaneous nephrostomy. They indicated all questions were answered to their satisfaction and desired to proceed.   ____________________________ Ronda Fairly Bernardo Heater, MD scs:aw D: 05/07/2013 13:31:03 ET T: 05/07/2013 14:06:40 ET JOB#: [id number]  cc: Nicki Reaper C. Bernardo Heater, MD, <Dictator> Abbie Sons MD ELECTRONICALLY SIGNED 05/09/2013 14:35

## 2014-08-03 NOTE — H&P (Signed)
PATIENT NAME:  [name], [name] MR#:  [id number] DATE OF BIRTH:  02-23-1932  DATE OF ADMISSION:  05/07/2013  PRIMARY CARE PHYSICIAN: Dr. Lavera Guise.   CHIEF COMPLAINT: Left groin pain and fever.   HISTORY OF PRESENT ILLNESS: A 81 year old male who presents with blood in the urine, fever and left groin pain. The patient was seen here on the 22nd of January and diagnosed with a small 4 mm left kidney stone; discharged with pain medications and Flomax. He presents today with the above complaint. The patient, at home, had a temperature of 101 plus positive chills. He has not passed the stone to his knowledge. He had a CT scan done on the 22nd, which did show a  4 mm urethral stone with mild hydronephrosis. Urology was called by Dr. Dahlia Client from the ER. Apparently, Dr. Bernardo Heater had recommended following interventional radiology for nephrostomy tube.   INR is 2.9. The patient is on Coumadin for atrial fibrillation.   REVIEW OF SYSTEMS:   CONSTITUTIONAL:  Positive fever, fatigue and weakness.  EYES: No blurred or double vision.  ENT: No ear pain, hearing loss, snoring, epistaxis or dentures.  RESPIRATORY:  No cough, wheezing, hemoptysis. Positive COPD, not on oxygen.  CARDIOVASCULAR:  No chest pain, palpitations, dyspnea, orthopnea or edema.  Positive atrial fibrillation. No dyspnea on exertion.    GASTROINTESTINAL: Positive nausea. No vomiting, diarrhea. Positive left groin pain. No melena or ulcers.  GENITOURINARY:  Positive hematuria. Positive renal kidney stone.  ENDOCRINE:  No polyuria or polydipsia.  HEMATOLOGIC AND LYMPHATICS:  Positive anemia, easy bruising.  SKIN:  No rash or lesions.  MUSCULOSKELETAL: No limited activity. No pain in the neck or shoulders.  NEUROLOGIC:  No history of CVA or TIA.  PSYCHIATRIC:  No history of anxiety or depression.   PAST MEDICAL HISTORY:  1.  Chronic obstructive pulmonary disease, not oxygen dependent.  2.  History of CVA with left-sided weakness.  3.   Chronic atrial fibrillation on Coumadin therapy.  4.  Hyperlipidemia.  5.  Hypertension.  6.  Diabetes.  7.  CAD with 2 stents.  8.  Diastolic heart failure with a preserved EF.   MEDICATIONS:  1.  Citalopram 10 mg daily.  2.  Januvia 100 mg daily.  3.  Aspirin 81 mg daily.  4.  Atorvastatin 40 mg at bedtime.  5.  Nasonex 2 sprays daily.  6.  Tylenol 650 q.8 hours p.r.n. pain.  7.  Albuterol 2 puffs q.6 hours.  8.  Lumigan eye drops, 1 drop at bedtime.  9.  Symbicort 2 puffs b.i.d.  10.  Glimepiride 2 mg 2 tablets daily.  11.  Imdur 40 mg t.i.d.  12.  Metformin 5 mg b.i.d.  13.  Metoprolol 50 mg 2 tablets in the morning and 1 tablet in the evening.  14.  Nitroglycerin sublingual p.r.n. chest pain.  15.  KCl 20 mEq every other day, alternating with 30 mEq. 16.  Spiriva 18 mcg daily.  17.  Coumadin 4 mg daily.  18.  Lisinopril 40 mg b.i.d.  19.  Albuterol/ipratropium q.6 hours p.r.n.  20.  Lasix 20 mg b.i.d.  21.  The patient was discharged with Flomax 0.4 mg daily.   ALLERGIES: CONTRAST, MORPHINE AND NIACIN.   FAMILY HISTORY:  Positive for hypertension.   SOCIAL HISTORY:  The patient quit smoking in 1992. He was a former smoker. No alcohol or IV drug use.   PAST SURGICAL HISTORY: Cardiac stents.   PHYSICAL EXAMINATION:  VITAL SIGNS:  Temperature 98.2. Pulse is 70. Respirations 20, blood pressure 136/63, 94% on room air.  GENERAL: The patient is alert, oriented, not in acute distress.  HEENT: Head is atraumatic. Pupils are round and reactive. Sclerae anicteric. Mucous membranes are dry. Oropharynx is clear.  NECK: Supple without JVD, carotid bruit or enlarged thyroid.  CARDIOVASCULAR: Irregularly irregular without murmur, gallops or rubs. PMI is hard to palpate.  LUNGS: Clear to auscultation without crackles, rales, rhonchi or wheezing. Normal percussion. Normal chest expansion.  ABDOMEN: Obese. Bowel sounds are positive. Nontender. Hard to appreciate organomegaly due to  body habitus.  EXTREMITIES: No clubbing, cyanosis or edema.  NEUROLOGIC:  Cranial nerves II through XII are intact. There are no focal deficits. Heel-to-shin is intact. Cerebellar exam is normal.  SKIN:  Without rash or lesions.  BACK: There is no CVA or vertebral tenderness.   LABORATORY DATA: Urinalysis shows 3+ LCE, 475 white blood cells, 75 red blood cells. CK is 27. CPK-MB less than 0.5. White blood cells 21.5, hemoglobin 13.4, hematocrit 41. Platelets are 222. Sodium 134, potassium 4.1, chloride 100, bicarb 26, BUN 23, creatinine 1.59,  glucose 146. ALT 13, AST 10, total protein 6.7, albumin 2.3, alk phos 62, bilirubin 0.7, calcium 9.1. Troponin less than 0.02. INR is 2.9. CT scan performed on 01/22/, showed a mild to moderate left hydronephrosis with 0.4 cm mid left ureteral stone and a 2 cm nonobstructing stone in the lower pole.   Chest x-ray shows no acute cardiopulmonary disease. EKG: Atrial fibrillation.   ASSESSMENT AND PLAN: A 81 year old male who was seen here on the 22nd with a 4 mm urethral stone who presents again with fevers and likely infected kidney stone.  1.  Kidney stone which looks to be infected. Dr. Dahlia Client from the ER has spoken with Dr. Bernardo Heater, who recommended interventional radiology to place nephrostomy tube. I have spoken  with the interventional radiologist. They feel that the patient should probably have a cystoscopy first and they will speak with Dr. Bernardo Heater and contact me with further recommendations for the plan. For now, I have started and the patient on IV Rocephin. Urine cultures have been ordered.  We will continue IV fluids. His EF was preserved on the last echo just about a month ago. Pain medications, p.r.n.  2.  Atrial fibrillation. INR is 2.9. We will hold the Coumadin for now, provide some vitamin K. The patient may need FFP. I am waiting for further instructions by interventional radiology for management.   His rate is controlled. We will continue his  outpatient medications.   3.  Diabetes. We will hold p.o. medications, provide sliding scale insulin for now.  4. Chronic obstructive pulmonary disease. This seems to be stable. We will continue his inhalers. 5. Diastolic heart failure. The patient is euvolemic. We will continue his outpatient medications.   The patient is FULL CODE STATUS.  TIME SPENT:  Approximately 60 minutes.   ____________________________ Donell Beers. Benjie Karvonen, MD spm:dmm D: 05/07/2013 09:23:00 ET T: 05/07/2013 11:32:25 ET JOB#: [id number]  cc: Natalya Domzalski P. Benjie Karvonen, MD, <Dictator> Cletis Athens, MD Donell Beers Kyan Giannone MD ELECTRONICALLY SIGNED 05/07/2013 14:06

## 2014-08-03 NOTE — Consult Note (Signed)
Brief Consult Note: Diagnosis: left ureteral calculus/UTI.   Patient was seen by consultant.   Consult note dictated.   Recommend to proceed with surgery or procedure.   Orders entered.   Discussed with Attending MD.   Comments: For left ureteral stent placement.  Electronic Signatures: Abbie Sons (MD)  (Signed 26-Jan-15 14:10)  Authored: Brief Consult Note   Last Updated: 26-Jan-15 14:10 by Abbie Sons (MD)

## 2014-08-03 NOTE — H&P (Signed)
PATIENT NAME:  [name], NARDOZZI MR#:  [id number] DATE OF BIRTH:  May 01, 1931  DATE OF ADMISSION:  01/26/2014  PRIMARY CARE PHYSICIAN:     REFERRING PHYSICIAN:   Jimmye Norman, MD  CHIEF COMPLAINT: Dysuria.   HISTORY OF PRESENT ILLNESS: Mr. Astorga is an 81 year old male history of hypertension and diabetes mellitus with recurrent urinary tract infections has been experiencing severe generalized weakness for the last few months. About 2 weeks back the patient was experiencing dysuria concerning this, went to the primary care physician and was given Keflex for a urinary tract infection. The patient completed the course of the antibiotics on 01/16/2014; however, the patient continues to have dysuria. Last night he started to experience fever or chills concerning this, came to the Emergency Department. Work-up in the Emergency Department, the patient is found to have urinary tract infection with a WBC in the urine of 530, 3+ leukocyte esterase. The patient is also found to have elevated white cell count of 15,000. The patient also states that he has been experiencing severe shortness of breath with exertion with increased swelling in the lower extremities. Denied having any chest pain.  The patient received one dose of ciprofloxacin in the Emergency Department. The patient was admitted in January 2015 with kidney stone. At that time urine cultures were mixed flora.   PAST MEDICAL HISTORY:  1. COPD. Has been on chronic steroids since May 2015.  Initially started by the pulmonologist.  2. History of CVA with left-sided weakness. 3. Chronic atrial fibrillation on Coumadin therapy.  4. Hyperlipidemia.  5. Hypertension.  6. Diabetes mellitus.  7. Coronary artery disease, status post 2 stent placement.  8. Diastolic congestive heart failure with a preserved ejection fraction.   ALLERGIES: CONTRAST, MORPHINE AND NIACIN.    HOME MEDICATIONS:  1. Tylenol 325 mg 2 tablets every 8 hours as needed.  2. Tamsulosin  0.4 mg daily.  3. Symbicort 2 puffs 2 times a day.  4. Spiriva once a day.  5. Prednisone 10 mg daily.  6. Nitroglycerin as needed sublingually.  7. Mometasone nasal.  8. Metoprolol succinate 81 tablet 2 times a day.  9. Metformin 1000 mg 2 times a day.  10. He was on eye drops.  11. Lisinopril 40 mg once a day.  12. Lasix 40 mg once a day.  13. Klor-Con 30 mEq daily.  14. Isordil 40 mg 3 times a day.  15. Glimepiride 4 mg once a day.  16. Coumadin 4 mg daily.  17. Celexa 10 mg daily.  18. Aspirin 81 mg daily.  19. Norco 5/325 milligrams q. 6 hours as needed.   SOCIAL HISTORY: Quit smoking in 1992. Denies drinking. alcohol or using illicit drugs.  Married, lives with his wife.     FAMILY HISTORY: Positive for hypertension.   REVIEW OF SYSTEMS:  CONSTITUTIONAL: Experiencing generalized weakness.  EYES: No change in vision.  EARS, NOSE AND THROAT: No change in hearing.  RESPIRATORY: Has been experiencing severe shortness of breath.   CARDIOVASCULAR: No chest pain, palpations. Has lower extremity swelling.   GASTROINTESTINAL: No nausea, vomiting, abdominal pain.  GENITOURINARY: Dysuria.  HEMATOLOGIC: No easy bruising or bleeding.  SKIN: No rashes or lesions.  MUSCULOSKELETAL: No joint pains and aches.  NEUROLOGIC: No weakness or numbness in any part of the body.   PHYSICAL EXAMINATION:  GENERAL: This is a well-built, well-nourished, obese male lying down in the bed, not in distress.  VITAL SIGNS: Temperature 99.7, pulse 95, blood pressure 169/87, respiratory rate  of 28, oxygen saturations are 93% on room air.  HEENT: Head normocephalic and atraumatic.  Eyes are round. There is no scleral icterus  Conjunctivae normal. Pupils equal and react to light. Mucous membranes moist. No pharyngeal erythema.  NECK: Supple. No lymphadenopathy. No JVD. No carotid bruit.  CHEST: No focal tenderness.  LUNGS: Decreased breath sounds in the lower lobes. Bibasilar crackles are heard.  HEART:  S1, S2, regular, tachycardia.  ABDOMEN: Bowel sounds present. Soft, nontender, nondistended. No hepatosplenomegaly.  EXTREMITIES: There is 2 to 3+ pitting edema extending up to the thighs.  SKIN: No rash or lesions.  MUSCULOSKELETAL: Good range of motion in all the extremities. NEUROLOGIC: The patient is alert, oriented to place, person, and time. Cranial nerves II through XII intact. Motor 5/5 in upper and lower extremities.   LABORATORY DATA: 3+ leukocyte esterase, WBC of 760. CT head without contrast: No acute intracranial abnormality. CMP: BUN 25, creatinine of 1.56. CBC: WBC of 15.8, hemoglobin 14, platelet count of 220,000. BNP 3800.   ASSESSMENT AND PLAN:  1. Sepsis secondary to urinary tract infection. Obtain blood and urine cultures. Start the patient on aztreonam, follow up with the cultures, de-escalate the antibiotics once we have the culture data is available.  2. Recurrent urinary tract infections. Will check postvoid residuals if the patient is having any  urinary distention, which is contributing to recurrent urinary tract infections.  3. Acute renal insufficiency. This is a combination of a urinary tract infection as well as congestive heart failure.  4. Congestive heart failure. Patient had weight gaining, lower extremity swelling, shortness of breath with exertion, bibasilar air crackles, elevated BNP. Keep the patient on Lasix IV and follow up.  5. Severe debility. Keep the patient on physical therapy.  6. Diabetes mellitus. Blood sugars are 353. Will continue to follow up. Hold metformin as the patient's creatinine is 1.54.  7. Hypertension, currently moderately controlled, could be from the stress. We will continue to follow up. Continue with home medications.  8. Chronic obstructive pulmonary disease. Has been chronically on steroids since June 2015. Will continue with that. Consider tapering down.  9. Atrial fibrillation rate is controlled on chronic anticoagulation.  10.  The patient is already on therapeutic Coumadin, which should provide deep vein thrombosis prophylaxis.   TIME SPENT: 55 minutes.    ____________________________ Monica Becton, MD pv:JT D: 01/27/2014 05:04:47 ET T: 01/27/2014 07:30:23 ET JOB#: [id number]  cc: Monica Becton, MD, <Dictator> Cecille Po, MD  Monica Becton MD ELECTRONICALLY SIGNED 02/09/2014 23:19

## 2014-08-03 NOTE — Discharge Summary (Signed)
PATIENT NAME:  Shawn, [name] MR#:  [id number] DATE OF BIRTH:  Aug 15, 1931  DATE OF ADMISSION:  01/26/2014 DATE OF DISCHARGE:  01/29/2014  PRIMARY CARE PHYSICIAN:  Webb Silversmith, NP.    FINAL DIAGNOSES:  1. Sepsis with Serratia, urinary source.  2. Acute respiratory failure which resolved.  3. Acute diastolic congestive heart failure.  4. Atrial fibrillation.  5. Hypertension.  6. Chronic obstructive pulmonary disease.  7. Benign prostatic hypertrophy.  8. Hypokalemia.  9. Diabetes.  10. Urinary retention and phimosis.   MEDICATIONS ON DISCHARGE: Include Tylenol 325 mg 2 tablets every 8 hours as needed for pain, aspirin 81 mg daily, lisinopril 40 mg daily, metoprolol extended release 50 mg twice a day, Spiriva 1 inhalation daily, Isordil 40 mg 3 times a day, Symbicort 160/4.5 two puffs twice a day, Celexa 10 mg daily, albuterol CFC 90 mcg per inhalation 2 puffs every 6 hours as needed for shortness of breath, Klor-Con 10 mEq 3 tablets daily, Lasix 40 mg daily, prednisone 10 mg daily with breakfast, Lumigan 0.3% ophthalmic solution 1 drop both eyes once a day at bedtime, glimepiride 4 mg 1 tablet once a day in the morning and 2 tablets orally at bedtime, acetaminophen/hydrocodone 325/5 one tablet every 6 hours as needed for moderate pain, metformin 1000 mg twice a day with meals, mometasone nasal spray 50 mcg per inhalation 2 sprays each nostril daily, Flomax 0.4 mg daily, warfarin 1 mg 4 tabs daily, finasteride 5 mg 1 tablet daily, Cipro 500 mg 1 tablet every 12 hours for 11 more days.   DISCHARGE INSTRUCTIONS:  Home health, physical therapy, help with strength. Foley to leg bag.   HOME OXYGEN: No.   DIET: Low-sodium, carbohydrate -controlled diet, regular consistency.   ACTIVITY: As tolerated.   FOLLOWUP: Next week Dr. Hollice Espy, 1-2 weeks with medical doctor.   HOSPITAL COURSE: The patient was admitted October 17 in the late evening and discharged 01/29/2014, was admitted with  clinical sepsis, acute renal insufficiency, acute congestive heart failure.   LABORATORY AND RADIOLOGICAL DATA DURING THE HOSPITAL COURSE: Included EKG that showed atrial fibrillation, premature ventricular complexes, septal infarct, lateral ischemia. Blood cultures gram-negative rods. Urine culture, Serratia marcescens.  INR upon admission was 1.9. Urinalysis 3 + leukocyte esterase. Troponin negative. Glucose 353, BUN 25, creatinine 1.56, sodium 140, potassium 4.2, chloride 101, CO2 of 28, calcium 8.9. Liver function tests normal range. White blood cell count 15.8, H and H 14.0 and 43.3, platelet count of 220,000. CT scan of the head negative. INR upon discharge 2.8. Creatinine 1.26 with a GFR of 59.   HOSPITAL COURSE PER PROBLEM LIST:  1.  For the patient's sepsis with Serratia, likely this is a urinary source, no other source found. Blood culture was positive, still waiting for the results on that, likely this is going to be Serratia also. The patient felt so well after initial treatment with IV Zosyn that he wanted to go home. This is sensitive to Cipro orally. I will switch over for a total of 14 day treatment, another 11 days of Cipro. I told him that his INR may go up with the Cipro treatment, so he will have to follow up with his primary care physician for an INR check every week initially. The patient will go home with a Foley catheter secondary to urinary retention.  2.  Acute respiratory failure, initially did not require oxygen, then he was hypoxic and required oxygen because his pulse oximetry was 88%. This had resolved.  He is on room air now.  3.  Acute diastolic congestive heart failure. He was started on IV Lasix. The patient diuresed well, lungs were clear upon discharge. He was kept on his usual medications and switched back to oral Lasix upon discharge. I think this sepsis probably threw him into the heart failure and goes back on normal medications as an outpatient.  4.  Atrial  fibrillation, rate controlled. INR therapeutic on Coumadin.  5.  Hypertension, blood pressure stable.  6.  Chronic obstructive pulmonary disease. Respiratory status had improved during the hospital course. I do not think this was a COPD exacerbation, I think this was all heart failure.  7.  BPH.  I added finasteride the patient's Flomax.  8.  Hypokalemia secondary to diuresis. Go back on normal potassium supplementation as outpatient.  9.  Diabetes. Sugars have been very variable here during the hospital course, go back on his usual medications.  10.  Urinary retention requiring Foley catheter placement by Dr. Ulyses Amor.  The patient  will have the Foley in for 1 week, finasteride was added. The patient is already on Flomax. The patient's phimosis and uncircumcised anatomy may make the patient more predisposed to urinary tract infections. Can speak with Dr. Hollice Espy about potentials.    TIME SPENT ON DISCHARGE: 35 minutes.     ____________________________ Tana Conch. Leslye Peer, MD rjw:bu D: 01/29/2014 13:55:16 ET T: 01/29/2014 15:21:04 ET JOB#: [id number]  cc: Tana Conch. Leslye Peer, MD, <Dictator> Webb Silversmith, NP Sherlynn Stalls, MD   Marisue Brooklyn MD ELECTRONICALLY SIGNED 01/31/2014 13:25

## 2014-08-03 NOTE — Consult Note (Signed)
Chief Complaint:  Subjective/Chief Complaint Pain resolved status post stent placement.  Afebrile.   VITAL SIGNS/ANCILLARY NOTES: **Vital Signs.:   27-Jan-15 05:03  Vital Signs Type Routine  Temperature Temperature (F) 98  Celsius 36.6  Temperature Source oral  Pulse Pulse 83  Respirations Respirations 16  Systolic BP Systolic BP 941  Diastolic BP (mmHg) Diastolic BP (mmHg) 70  Mean BP 85  Pulse Ox % Pulse Ox % 96  Pulse Ox Activity Level  At rest  Oxygen Delivery 2L   Brief Assessment:  GEN well developed, no acute distress   Gastrointestinal details normal Soft  Nontender   Assessment/Plan:  Assessment/Plan:  Assessment Pain resolved status post stent placement.  Urine culture pending.   Plan KUB ordered.  Follow-up 1-2 weeks after discharge.   Electronic Signatures: Abbie Sons (MD)  (Signed 27-Jan-15 09:33)  Authored: Chief Complaint, VITAL SIGNS/ANCILLARY NOTES, Brief Assessment, Assessment/Plan   Last Updated: 27-Jan-15 09:33 by Abbie Sons (MD)

## 2014-08-03 NOTE — Op Note (Signed)
PATIENT NAME:  [name], [name] MR#:  [id number] DATE OF BIRTH:  [birth year]  DATE OF PROCEDURE:  05/07/2013  PREOPERATIVE DIAGNOSES:  1. Left proximal ureteral calculus.  2. Febrile urinary tract infection.   POSTOPERATIVE DIAGNOSES:  1. Left proximal ureteral calculus.  2. Febrile urinary tract infection.  3. Phimosis.   PROCEDURE: Cystoscopy with placement of left ureteral stent.   SURGEON: John Giovanni, M.D.   ASSISTANT: None.   ANESTHETIC: MAC.   INDICATIONS: This 81 year old male presented to the Emergency Department on 05/03/2013 with lower abdominal pain. Stone protocol CT was remarkable for a 4 mm left proximal ureteral calculus with moderate hydronephrosis and hydroureter. He also has a nonobstructing lower pole stone. Over the past 2 to 3 days, he has had continued intermittent pain and last night developed fever and chills to 101 degrees. He was admitted to the hospitalist service. He has been afebrile since his hospital admission. He is on chronic anticoagulation. Placement of a left ureteral stent has been elected after discussing options.   DESCRIPTION OF PROCEDURE: He was taken to the operating room and placed supine on the table. Sedation was obtained by anesthesia. He was placed in the low lithotomy position, and his external genitalia were prepped and draped in the usual fashion. There was significant phimosis with inability to retract the foreskin. A timeout per hospital protocol was performed with all in agreement. The tight phimosis would not allow introduction of a 21-French cystoscope. It was elected not to perform a dorsal slit based on his anticoagulation. The phimotic opening was dilated with Walther sounds to 24-French. The 21-French cystoscope was then inserted. The urethral meatus was visualized directly, and the cystoscope was passed. The prostate with mild lateral lobe enlargement and a prominent median lobe. The urethra was normal in caliber without stricture.  There was mild erythema and sediment in the bladder. The left ureteral orifice was visualized, and no efflux was seen. There was clear efflux from the right ureteral orifice. A 0.035 guidewire was placed through the cystoscope and into the left ureteral orifice and passed up easily in the left renal pelvis. A 5-French open-ended ureteral catheter was then placed over the wire into the vicinity of the UPJ. Retrograde pyelogram was performed. There was no significant left hydronephrosis. No extravasation of contrast was seen. The guidewire was replaced, and the open-ended ureteral catheter was removed. A 6-French/24 cm ureteral stent was placed. Due to the small and elongated renal pelvis, it was difficult to get a curl within the renal pelvis. The stent was a crooked within an upper pole calyx. The bladder was emptied, and the cystoscope was removed. The patient was taken to the PACU in stable condition. There were no complications. EBL minimal.    ____________________________ Ronda Fairly. Bernardo Heater, MD scs:gb D: 05/07/2013 15:25:47 ET T: 05/08/2013 01:34:58 ET JOB#: [id number]  cc: Nicki Reaper C. Bernardo Heater, MD, <Dictator> Abbie Sons MD ELECTRONICALLY SIGNED 05/09/2013 14:35

## 2014-08-03 NOTE — H&P (Signed)
PATIENT NAME:  [name], [name] MR#:  [id number] DATE OF BIRTH:  April 05, 1932  DATE OF ADMISSION:  04/24/2013  PRIMARY CARE PHYSICIAN: Dr. Lavera Guise.   HISTORY OF PRESENT ILLNESS:  The patient is a 81 year old Caucasian male with  past medical history significant for history of COPD who is not on oxygen at home who presents to the hospital with complaints of a 2 or 3 week history of just not feeling well.  According to the patient, he was doing well up until approximately 2 or 3 weeks ago when he started having sickness with shortness of breath, as well as increasing cough and phlegm production. The patient's phlegm was described as whitish-grayish color. It seems to be more in quantity as well as thicker.  He has difficulty to cough  it up. He also feels feverish with temperature of the low 100s. He had at least two courses of antibiotic therapy with no significant improvement. He also received Levaquin as well as prednisone taper but he was getting worse so he decided to come to Emergency Room for further evaluation. In the Emergency Room his chest x-ray was concerning for possible fluid overload. He was also noted to have lower extremity swelling. He was in AFib, RVR and hospitalist services were contacted for admission because of concerns of congestive heart failure apart from COPD exacerbation.   PAST MEDICAL HISTORY: Significant for history of COPD, not oxygen dependent, history of stroke with left-sided weakness, seemed to be improved since 1999, history of chronic AFib on Coumadin therapy, hyperlipidemia, hypertension, diabetes mellitus type 2, coronary artery disease with two stents in the past, also questionable cardiomyopathy. The patient describes a part of his heart and not working well.   MEDICATIONS: According to the medical records, the patient is on acetaminophen extended release 650 mg p.o. every 8 hours as needed, albuterol 2 puffs every 6 hours as needed, aspirin 81 mg p.o. daily,  atorvastatin 40 mg p.o. at bedtime, citalopram 10 mg p.o. daily, fluocinonide topical cream 0.05% apply to affected area 3 times daily, furosemide 10 mg p.o. daily, glimepiride 2 mg 2 tablets which will be 4 mg once daily, isosorbide dinitrate 40 mg p.o. 3 times daily, Januvia 100 mg p.o. daily, levofloxacin 500 mg p.o. daily, lisinopril 40 mg p.o. daily, Lumigan 0.03% ophthalmic solution once at bedtime, metoprolol succinate 100 mg in the morning and 50 mg in the evening, Nasonex 2 sprays once daily, nitroglycerin 0.4 mg sublingually every 5 minutes as needed. Potassium chloride 10 mg 2 tablets daily, which would be 20 mg p.o. alternating with 30 mg p.o. every second day. Prednisone tapering dose started on 04/20/2013, Spiriva 1 capsule inhalation once daily, Symbicort 160/4.5 two puffs twice daily and warfarin 4 mg p.o. in the evening. The patient is not on oxygen.   ALLERGIES: CONTRAST DYE, MORPHINE AS WELL AS NIACIN.   FAMILY HISTORY: Positive for hypertension.   SOCIAL HISTORY: The patient used to smoke for at least 40 years, quit in 1992. No alcohol abuse. Lives with his wife.   REVIEW OF SYSTEMS: Positive for feeling feverish and chilly over the past few weeks, fatigue and weak. Having some blurring of vision, which he attributes to glaucoma also postnasal drip with some cough, as well as wheezes as well as shortness of breath, which seemed to be worsening over a period of time, arrhythmias in his chest, intermittent constipation. Also intermittent dysuria, which he describes for the past one year. He was seen by Dr. Bernardo Heater because of  elevated PSA; however, his PSA is somewhat improved, and he had PSA checked approximately a year ago. Otherwise, denies any high fevers, pains, weight loss or gain. Denies any double vision, cataracts.  EARS, NOSE, THROAT: Denies any tinnitus, allergies, epistaxis, sinus pain, dentures, difficulty swallowing.  RESPIRATORY:  Denies any hemoptysis, asthma or  COPD. CARDIOVASCULAR: Denies chest pains, orthopnea, edema, arrhythmias, palpitations or syncopal.  GASTROINTESTINAL:  Denies nausea, vomiting, diarrhea. Admits to having one episode of vomiting a week ago. No hematemesis, rectal bleeding, change in bowel habits.  GENITOURINARY: Denies any hematuria, frequency, incontinence.  ENDOCRINE: Denies any polydipsia or nocturia, thyroid problems, or cold intolerance or thirst HEMATOLOGIC:  Denies any anemia, easy bruising, bleeding swollen glands. SKIN: Denies any acne, rashes, change in moles.  MUSCULOSKELETAL: Denies arthritis, cramps, swelling or gout. NEUROLOGIC:  No numbness, epilepsy or tremor.  PSYCHIATRIC: Denies anxiety, insomnia or depression.   PHYSICAL EXAMINATION: VITAL SIGNS: On arrival to the hospital, the patient's temperature was 97.8, pulse was 99. Respiratory rate was 26 and blood pressure 199/92. Saturation was 93% on oxygen therapy.  GENERAL: This is a well-developed, well-nourished Caucasian male in moderate to severe respiratory distress, sitting on the stretcher, is leaning forward and breathing heavily. HEENT:  His pupils are equal and reactive to light. Extraocular movements intact. No icterus or conjunctivitis. Has normal hearing. No pharyngeal erythema. Mucosa is moist.  NECK: No masses. Supple, nontender. Thyroid is not enlarged. No adenopathy. No JVD or carotid bruits bilaterally. Full range of motion.  LUNGS: Crackles inferiorly.  A few rales as well as rhonchi were heard. Markedly diminished breath sounds bilaterally posteriorly and a few wheezes were heard. The patient does have labored inspirations as well as increased effort to breathe. He is in moderate respiratory distress.  CARDIOVASCULAR: S1, S2 appreciated. Rythm is   irregularly irregular. PMI not lateralized. Chest is nontender to palpation.  EXTREMITIES: 1+ pedal pulses. 1 to 2+ lower extremity edema, bilaterally more on the left side. No calf tenderness or  cyanosis was noted.  ABDOMEN: Soft, nontender. Bowel sounds are present.  No hepatosplenomegaly or masses were noted.  RECTAL: Deferred.  MUSCLE STRENGTH: Able to move all extremities. No cyanosis, degenerative joint disease or kyphosis. Gait not tested. SKIN: Did not reveal any rashes, lesions, erythema, nodularity or induration. It was warm and dry to palpation.  LYMPHATIC: No adenopathy in the cervical region.  NEUROLOGICAL: Cranial nerves grossly intact. Sensory is intact. No dysarthria or aphasia. The patient has some mild weakness in his left upper extremity.  The patient is alert and oriented to time, person and place, cooperative. Memory is good. No significant confusion, agitation or depression was noted.   LABORATORY DATA: BMP showed glucose of 189, BUN 20. Sodium 132, otherwise BMP was unremarkable. Albumin level was 3.2, otherwise liver enzymes were normal. Cardiac enzymes, first set negative. White blood cell count is elevated to 17.1, hemoglobin was 15.9, platelet count was 257. Coagulation panel: Pro time 21.9, INR was 2.0. PH of venous blood was 7.41, pCO2 was 37. The patient's EKG showed AFib at a rate of 99 beats per minute. Normal axis, septal infarct, age indeterminate, with QS in the septal anterior leads, possible lateral infarct according to EKG criteria and nonspecific ST-T changes were noted. The patient is chest x-ray, portable single view, 04/24/2013, revealed cardiac enlargement, increased interstitial lung markings which may be due to chronic lung disease, superimposed mild fluid overload was not excluded. Doppler ultrasound of lower extremities is still pending at the  time of dictation.   ASSESSMENT AND PLAN: 1.  Congestive heart failure, acute, questionable systolic.  Admit the patient to medical floor. Start him on Lasix IV as well as nitroglycerin topically. We will continue on ACE inhibitor. We may need to advanced doses.  2.  Atrial fibrillation with rapid ventricular  response. Will continue metoprolol. We will add digoxin 1 dose now. We will continue Coumadin therapy and follow anticoagulation therapy daily. 3.  Chronic obstructive pulmonary disease exacerbation. We will continue Rocephin and Zithromax, steroid taper, inhalers as well as nebulizer. Get sputum cultures if possible.  4.  Lower extremity swelling. We will get Doppler ultrasound, as well as echocardiogram to rule out systolic congestive heart failure.  5.  History of tobacco abuse. The patient has not been smoking since '92 now and this was discussed with him.  6.  Hypertension as above. We will add nitroglycerin topically. We will continue ACE inhibitor as well as metoprolol and we will follow the patient's blood pressure readings whenever he is more stabilized.    TIME SPENT: 50 minutes.    ____________________________ Theodoro Grist, MD rv:dp D: 04/24/2013 13:58:21 ET T: 04/24/2013 14:39:39 ET JOB#: [id number]  cc: Theodoro Grist, MD, <Dictator> Cletis Athens, MD [place] MD ELECTRONICALLY SIGNED 04/24/2013 18:53

## 2014-08-03 NOTE — Consult Note (Signed)
CONSULTING SERVICE: MEDICINE FOR CONSULTATION: NEED FOR FOLEY OF PRESENT ILLNESS: [name] is a pleasant 81 YO gentleman with a hx of recurrent UTIs. He has had 5 in this past year. While not all of these lead to hospital admission, he does have worsening of his baseline LUTS as well as malodorous urine. He has a hx of BPH that is well managed with flomax. He has minimal nocturia but has significant urgency and frequency. Additionally, he has not been able to retract his foreskin in many years. He has a number of other medical issues. Currently, he presents with elevated SCr, WBC, fevers, and UA suggesstive of UTI and an overall clinical picture concerning for progression to pyelonephritis and/or sepsis.   PAST MEDICAL HISTORY: COPD. Has been on chronic steroids since May 2015.  Initially started by the pulmonologist. History of CVA with left-sided weakness.Chronic atrial fibrillation on Coumadin therapy. Hyperlipidemia. Hypertension. Diabetes mellitus. Coronary artery disease, status post 2 stent placement. Diastolic congestive heart failure with a preserved ejection fraction.  CONTRAST, MORPHINE AND NIACIN.   MEDICATIONS: Tylenol 325 mg 2 tablets every 8 hours as needed. Tamsulosin 0.4 mg daily. Symbicort 2 puffs 2 times a day. Spiriva once a day. Prednisone 10 mg daily. Nitroglycerin as needed sublingually. Mometasone nasal. Metoprolol succinate 81 tablet 2 times a day. Metformin 1000 mg 2 times a day. He was on eye drops. Lisinopril 40 mg once a day. Lasix 40 mg once a day. Klor-Con 30 mEq daily. Isordil 40 mg 3 times a day. Glimepiride 4 mg once a day. Coumadin 4 mg daily. Celexa 10 mg daily. Aspirin 81 mg daily. Norco 5/325 milligrams q. 6 hours as needed.  HISTORY: Quit smoking in 1992. Denies drinking. alcohol or using illicit drugs. lives with his wife.    HISTORY: Positive for hypertension. No known GU malignancies. OF SYSTEMS: HPI. Baseline LUTS well managed with flomax.  EXAM: AFVSS, on IV  ABxEOMI, PERRLmood & affect, accompanied by son-in-lawrespirationsND, NT, no guarding/reboundsevere phimosis/excoriations on foresekin demonstrates retention to 344m. Given overall picture of UTI with concern for pyelo and sepsis, foley catehter placed.  prepped. Dorsal penile nerve block adminstered. A hemastat was used to stretch the forskin open to accomodate placement of a foley catheter. A 20Fr coude catheter was placed meeting no resistance after prepping of the glans. Approximately 3578mof clear urine was returned.  and Plan:  1. Phimosis - patient will need outpatient follow up with urology - Dr. AsHollice Espyf [facility] to discuss dorsal slit vs circumcision. This may imrpove issue with recurrent UTIs as well. Foley catheter should be in place until acute issues resolved and may be removed at discretion of primary team prior to discharge when a formal trial of void should be undertaken. BPH/LUTS - seems overall stable with flomax. Will need further outpatient followup and optimization. Patient may address this with Dr. BrErlene Quans well. UTI - please follow up urine culture and treat accordingly.  you for allowing me to participate in Mr. WeZarrellaare.  KuSanda LingerMDSurgery  Electronic Signatures: KuLum BabeMD)  (Signed on 18-Oct-15 21:19)  Authored  Last Updated: 18-Oct-15 21:19 by KuLum BabeMD)

## 2014-08-03 NOTE — Discharge Summary (Signed)
PATIENT NAME:  [name], [name] MR#:  [id number] DATE OF BIRTH:  1931-09-03  DATE OF ADMISSION:  05/07/2013 DATE OF DISCHARGE:  05/08/2013  ADMISSION DIAGNOSIS: Left ureteral obstructive stone.   DISCHARGE DIAGNOSES: 1.  Left ureter obstructive stone, status post stent placement.  2.  Urinary tract infection.  3.  History of atrial fibrillation, on Coumadin.   PERTINENT LABORATORIES: INR is 2.0. Sodium 134, potassium 3.9, chloride 103, bicarb 24, BUN 20, creatinine 1.38, glucose is 148. White blood cells 18, hemoglobin 12.5, hematocrit 37.1, platelets are 182. Blood cultures negative to date. Urine culture mixed bacterial organisms.    HOSPITAL COURSE: This is a very pleasant 81 year old male who presented to the ER with left groin pain and was treated for a kidney stone about a week prior to his admission, found to have an obstructive stone. For further details, please her refer to the H and P.  1.  Left ureteral stone. Dr. Bernardo Heater was consulted. The patient underwent a stent placement in the left ureter on 05/07/2013. The patient is doing well, is not complaining of any pain. The patient will follow up with Dr. Bernardo Heater in 1 week.  2.  Urinary tract infection. His urine culture is mixed contaminated UTI secondary to his kidney stone. He was on Rocephin here. He was afebrile and his white blood cell count has improved so we will go ahead and discharge him with a p.o. equivalent.  3.  Atrial fibrillation. The patient may resume his Coumadin. His INR is 2.0 at discharge. Rate was controlled.  4.  Diabetes. The patient will continue outpatient medications.  5.  History of COPD. He seems to be stable at this point.  5.  History of diastolic heart failure which is stable.   MEDICATIONS:  1.  Tylenol 325 two tablets q.8 hours p.r.n.  2.  Albuterol 2 puffs q.6 hours p.r.n.  3.  Aspirin 81 mg daily.  4.  Lipitor 40 mg at bedtime.  5.  Lumigan 1 drop at bedtime.  6.  Celexa 10 mg daily.  7.  Lasix  20 mg daily.  8.  Amaryl 4 mg daily.  9.  Isordil 40 mg 3 tablets daily.  10.  Lisinopril 40 mg daily.  11.  Metformin 1000 mg b.i.d.  12.  Metoprolol 50 mg b.i.d.  13.  Nasonex 2 sprays daily.  14.  Nitrostat sublingual p.r.n. chest pain. He. 15.  K-Chlor 20 mEq every other day alternating with 30 mEq every other day.  16.  Januvia 100 mg daily.  17.  Spiriva 18 mcg daily.  18.  Coumadin 4 mg daily.  19.  Slow-Mag 0.4 mg daily.  20.  Percocet 5/325 q.6 hours p.r.n. pain.  21.  Phenazopyridine 200 mg t.i.d. p.r.n. burning with urination.  22.  Keflex 500 mg t.i.d. x 10 days.   DISCHARGE DIET: Low sodium, ADA diet.   DISCHARGE ACTIVITY: As tolerated.   DISCHARGE FOLLOWUP:  In 1 week with Dr. Bernardo Heater, in 2 weeks with Dr. Lavera Guise.   TIME SPENT: 35 minutes. The patient was medically stable for discharge.   ____________________________ Merari Pion P. Benjie Karvonen, MD spm:cs D: 05/08/2013 14:44:54 ET T: 05/08/2013 15:23:45 ET JOB#: [id number]  cc: Eloina Ergle P. Benjie Karvonen, MD, <Dictator> Scott C. Bernardo Heater, MD Cletis Athens, MD Nakari Bracknell P Samay Delcarlo MD ELECTRONICALLY SIGNED 05/08/2013 21:26

## 2014-08-05 ENCOUNTER — Emergency Department
Admit: 2014-08-05 | Disposition: A | Discharge status: Home / Self Care | Payer: Self-pay | Admitting: Emergency Medicine

## 2014-08-05 LAB — BASIC METABOLIC PANEL
ANION GAP: 8 (ref 7–16)
BUN: 19 mg/dL
CREATININE: 1.2 mg/dL
Calcium, Total: 8.6 mg/dL — ABNORMAL LOW
Chloride: 106 mmol/L
Co2: 23 mmol/L
EGFR (Non-African Amer.): 57 — ABNORMAL LOW
GLUCOSE: 235 mg/dL — AB
Potassium: 3.6 mmol/L
Sodium: 137 mmol/L

## 2014-08-05 LAB — URINALYSIS, COMPLETE
Bilirubin,UR: NEGATIVE
Glucose,UR: 50 mg/dL (ref 0–75)
KETONE: NEGATIVE
Nitrite: NEGATIVE
PH: 5 (ref 4.5–8.0)
Protein: 100
SPECIFIC GRAVITY: 1.01 (ref 1.003–1.030)
Squamous Epithelial: NONE SEEN

## 2014-08-05 LAB — CBC WITH DIFFERENTIAL/PLATELET
BASOS ABS: 0.1 10*3/uL (ref 0.0–0.1)
Basophil %: 0.6 %
Eosinophil #: 0.1 10*3/uL (ref 0.0–0.7)
Eosinophil %: 0.7 %
HCT: 40.1 % (ref 40.0–52.0)
HGB: 13.3 g/dL (ref 13.0–18.0)
Lymphocyte #: 2.3 10*3/uL (ref 1.0–3.6)
Lymphocyte %: 13.1 %
MCH: 27.3 pg (ref 26.0–34.0)
MCHC: 33.1 g/dL (ref 32.0–36.0)
MCV: 83 fL (ref 80–100)
MONOS PCT: 11.4 %
Monocyte #: 2 x10 3/mm — ABNORMAL HIGH (ref 0.2–1.0)
Neutrophil #: 12.8 10*3/uL — ABNORMAL HIGH (ref 1.4–6.5)
Neutrophil %: 74.2 %
PLATELETS: 246 10*3/uL (ref 150–440)
RBC: 4.86 10*6/uL (ref 4.40–5.90)
RDW: 16.3 % — ABNORMAL HIGH (ref 11.5–14.5)
WBC: 17.2 10*3/uL — AB (ref 3.8–10.6)

## 2014-08-05 LAB — TROPONIN I: Troponin-I: <0.03 ng/mL

## 2014-08-07 LAB — URINE CULTURE

## 2014-08-09 ENCOUNTER — Telehealth: Payer: Self-pay | Admitting: *Deleted

## 2014-08-09 NOTE — Telephone Encounter (Signed)
There are no forms in my box, Mel, do you have this?

## 2014-08-09 NOTE — Telephone Encounter (Signed)
Pt does not use that company---i have called pt in past and he states he uses [place] [employer]

## 2014-08-09 NOTE — Telephone Encounter (Signed)
Form for diabetic testing in your IN box for completion.

## 2014-08-11 NOTE — Consult Note (Signed)
Urology Consultation Report: for Consultation: Sepsis - presumed GU tract source MD: Fulton Reek, MDMD: Darcella Cheshire, MD Local Urologist: Hollice Espy, MD 81 y.o. MWM who presented to the [facility] ER yesterday with F/C with CP/SOB. In the ER, the pt had a marked leukocystosis (19k) with a left shift. Cr was elevated at 1.65 (baseline 1.26 on 01/29/2014).  UA with marked pyuria (825 wbc) with neg microhematuria (221 rbc); nitrites neg; budding yeast, no bacteria.  Blood Cultures were drwan x2 (neg to date).  KUB: 1.7cm radio-opaque density in the region of the left kidney, 3.0 cm density in the region of the gallbladder, new 1.3cm RUQ opacity, large amount of stool throughout the colon.  VQ Scan neg, troponins neg. endorses a several day h/o dysuria with urge urinary incontinence.  Pt has a long h/o BPH/LUTS/Urinary Retention with Recurrent UTI's on Flomax/Proscar, requiring a foley x 10 days last November.  Pt also with a h/o recurrent urolithiasis - denies flank/abd pain/colic or gross hematuria.  Pt is 5 days out from a circumcision performed by Dr. Hollice Espy for ?phimosis.  Pt denies increasing incisional pain or purulence. as per the detailed H&P dated 04/20/2014 by Dr. Fulton Reek. Tmax 38.8C (axillary, yesterday at 18:38), now 37.8C, P 116 (irreg, irreg), RR 32 (unlabored), BP 108/67, O2 Sat 97% (3L [place]) WD, Obese, WM in NADWarm/Dry, without lesions about the Head/Neck; no Cervical, Supraclavicular, Inguinal AdenopathyNC/AT, EOMI, Anictericno masses, no bruitsCTA; nL Resp EffortIrreg/irreg rate/rhythm without M/G/RSoft/Obese, NT/ND, hypoactive BS, no palpable masses/organomegaly; positive urinary urgency with deep suprapubic palpationCircumcision incision C/D/I - NT; nL testes/epididymides - NT, no massesmild anal stenosis; 1+ Prostate - NT, no nodules1-2+ pitting edema to the knees, b/L (L>R)non-focalA&O x4, appropriate, fair insight  WBC 19k, PT INR 1.3, Cr 1.65 (baseline: 1.26 on 01/29/2014);  UA (per HPI) WBC 10.9k, PT INR 1.6, Cr 1.46 Scan: 352m  Sepsis - likely due to cystitison IV Vanc and Zosyn BPH/Voiding Inefficiencylikely cause of the UTI, however, is responding nicely to IV Abx's with resolution of fever and leukocytosis; pt would like to avoid a foley catheter, if possible 5 days s/p Circumcisionwell without sx's/signs of infection Urolithiasisclinical colic ARI - likely due to Sepsis +/- BPH/elevated PVRon IV Abx's without bladder decompression  Continue IV Vanc/ZosynContinue Flomax/ProscarMonitor Bladder Scan PVR's (please allow pt to void prior to bladder scan)require foley if with persistent elevated PVR's and improvement does not continue or condition worsensAgree with Abd UKoreato evaluate for hydronephrosis and/or cholecystitis AHollice Espywill assume Urologic management as of 5am tomorrow morning.   Electronic Signatures: KDarcella Cheshire(MD) (Signed on 10-Jan-16 10:16)  Authored   Last Updated: 10-Jan-16 10:22 by KDarcella Cheshire(MD)

## 2014-08-11 NOTE — H&P (Signed)
PATIENT NAME:  [name], [name] MR#:  [id number] DATE OF BIRTH:  Nov 07, 1931  DATE OF ADMISSION:  06/03/2014  REFERRING EMERGENCY ROOM PHYSICIAN: Dr. Lenise Arena.   PRIMARY CARE PHYSICIAN: Webb Silversmith, FNP.    CHIEF COMPLAINT: Chills, weakness, and dark-colored urine.   HISTORY OF PRESENT ILLNESS: This very pleasant 81 year old man who was recently discharged from [facility] on January 25 after hospitalization for acute stroke presents today with about 5 days of fatigue, chills, and darkening urine. He reports that he actually went to his primary care physician on February 17 and was diagnosed with pneumonia, a UTI, and some hematuria, and started on Levaquin. His symptoms have not improved since that time. He has continued to feel very tired and unable to move around as normally. He has been eating well. He has not had any nausea or vomiting or abdominal pain. He has had cold chills, and just felt very tired. On presentation to the Emergency Room he was found to have a urinary tract infection with 6000 white blood cells per high-powered field. He does have a history of a chronic left kidney stone, recurrent UTIs, and history of candidemia which was attributed to this chronic stone. He has a ureteral stent in place since 04/24/2014. Urology has already been consulted to see the patient.   PAST MEDICAL HISTORY:  1. Recent CVA January 2006.  2. COPD.   3. Atrial fibrillation on Coumadin.  4. Hyperlipidemia.  5. Hypertension.  6. Diabetes mellitus type 2.  7. Coronary artery disease status post stenting.  8. Benign prosthetic hypertrophy.  9. Sleep apnea.  10. History of urosepsis in January 2016.   PAST SURGICAL HISTORY:  1.  Status post ureteral stent placement.  2.  Status post cardiac stent placement.   SOCIAL HISTORY: The patient lives with his wife. He uses a walker after the recent stroke due to loss of balance. He has home health physical and occupational therapy. He is a former  smoker, quit in 1992, has about a 40 pack-year history. Denies any recent alcohol use.   FAMILY MEDICAL HISTORY: Positive for diabetes, coronary artery disease. and lung cancer.    ALLERGIES: IV CONTRAST, MORPHINE, AND NIACIN.   HOME MEDICATIONS:  1. Tylenol 500 mg 1 tablet every 6 hours as needed for pain.  2. Tamsulosin 0.4 mg 1 capsule once a day.  3. Symbicort 160 mcg-4.5 mcg per inhalation 2 puffs inhaled twice a day.  4. Spiriva 18 mcg 1 capsule inhaled daily.  5. Prednisone 10 mg once a day at bedtime.  6. Potassium chloride 10 mEq 1 tablet 3 times a day.  7. Nystatin triamcinolone apply topically to affected area twice a day.  8. Nitroglycerin 0.4 mg 1 tablet sublingual every 5 minutes as needed for chest pain. 9. Metoprolol succinate 500 mg extended release 1 tablet twice a day.  10. Lumigan 0.3% ophthalmic solution 1 drop to each affected eye once a day.  11. Lisinopril 10 mg 1 tablet once a day in the morning.  12. Levofloxacin 500 mg 1 tablet once a day.  13. Levemir FlexTouch 100 units per mL, 24 units once a day at bedtime.  14. Januvia 100 mg 1 tablet once a day in the morning.  15. Isosorbide dinitrate 40 mg 0.5 tablets 3 times a day.  16. Homatropine-hydrocodone 1.5 mg-5 mg in 5 mL, 5 mL orally every 8 hours as needed for cough.     17. Furosemide 40 mg 0.5-1 tablet orally once a  day depending on fluid.  18. Finasteride 5 mg 1 tablet once a day.  19. Eliquis 5 mg 1 tablet 2 times a day.  20. Citalopram 10 mg 1 tablet once a day.  21. Atorvastatin 40 mg 1 tablet once a day.   22. Alprazolam 0.25 mg 1 tablet orally once a day at bedtime.  23. Albuterol CFC free 90 mcg per inhalation, 2 puffs inhaled every 6 hours as needed for shortness of breath.   24. Albuterol 2.5 mg in 3 mL, 3 mL inhaled every 6 hours as needed for shortness of breath.      REVIEW OF SYSTEMS:     CONSTITUTIONAL: Positive for chills. No measured fevers. Positive for fatigue and weakness. No pain. No  change in weight.  HEENT: No change in vision or hearing. No pain in the eyes or ears. No sinus congestion, sore throat, or difficulty swallowing.  RESPIRATORY: No coughing, wheezing, hemoptysis, or painful respirations.  CARDIOVASCULAR: No chest pain. Positive for edema. No arrhythmia or orthopnea. No syncope.  GASTROINTESTINAL: Positive for nausea and decreased appetite. No vomiting or diarrhea. No abdominal pain.  GENITOURINARY: Positive for darkening urine, possible hematuria. Positive for frequency. No dysuria.  ENDOCRINE: No polyuria, polydipsia, or hot or cold intolerance.  HEMATOLOGIC: No easy bruising or bleeding.  SKIN: No new rashes or lesions.  MUSCULOSKELETAL: No new pain in the neck, back, knees, shoulders, knees, or hips. No gout.  NEUROLOGIC: No new focal numbness or weakness. He does have a history of CVA. No seizure. No dementia.  PSYCHIATRIC: No schizophrenia or bipolar disorder.     PHYSICAL EXAMINATION:  VITAL SIGNS: Temperature 99.4, pulse 82, respirations 22, blood pressure 94/61, oxygenation 96% on room air.  GENERAL: The patient appears weak and pale.  HEENT: Pupils equal, round, and reactive to light. Conjunctivae are clear. Oral mucous membranes are dry. Posterior oropharynx is clear of exudate, edema, or erythema.  NECK: Supple, trachea midline.   RESPIRATORY: Lungs clear to auscultation bilaterally with good air movement. No wheezes, rhonchi, or rales.  CARDIOVASCULAR: Regular rate and rhythm. No murmurs, rubs, or gallops. He does have 1 + pitting edema bilaterally. Peripheral pulses are 2 +.  ABDOMEN: Soft, nontender, nondistended. Bowel sounds are normal. No guarding, rebound, hepatosplenomegaly, or mass.  MUSCULOSKELETAL: No joint effusions. Range of motion is normal.  SKIN: No new rashes or lesions. No open wounds.  NEUROLOGIC: Cranial nerves II through XII grossly intact. Strength and sensation intact. Nonfocal.  PSYCHIATRIC: The patient alert and oriented x  4 with good insight into his clinical condition. BACK: No CVA tenderness.   RESULTS: Sodium 135, potassium 4.4, chloride 104, bicarbonate 24, BUN 26, creatinine 1.5, glucose 313. LFTs normal with the exception of decreased albumin at 2.4. White blood cells 17.1, hemoglobin 13.7, platelets 276,00, MCV is 83. INR is 1.4. Urinalysis frankly is positive for UTI with white blood cells of 6037 with 1602 red blood cells.   IMAGING: CT scan of the abdomen and pelvis for stone shows left-sided ureteral stent in good position. Continued presence of large nonobstructive calculus in lower pole collecting system of the left kidney. There is a nonobstructive 5 mm calculus seen in the left renal pelvis. No hydronephrosis or renal obstruction on the right. Mild wall thickening and surrounding inflammation involving the urinary bladder concerning for cystitis, also of note a 5 mm nodule in the left lower lobe of the lungs.   Chest x-ray shows no acute cardiopulmonary abnormalities.   ASSESSMENT AND PLAN:  1.  Urinary tract infection with yeast:  This patient has a history of recurrent urinary tract infections and chronic large left stone which has been nidus for infection in the past. It seems that he had been unstable and unable to have definitive treatment with removal of the stone. Appreciate that urology has already seen this patient.  At this point no acute intervention is planned. The patient has a followup appointment in the urology office in 1 week to discuss definitive management. We will continue with Rocephin as has been started in the Emergency Room. Urine with yeast, he has history of fungemia. Will start fluconazole 400 mg daily and consult ID for further recomendations. Blood and urine cultures are pending.  2.  Pulmonary nodule seen on CT scan: The patient will need a followup in 3 months with CT scan to evaluate this pulmonary nodule. He does have a history of smoking in the past and is at risk for  bronchogenic carcinoma.  3.  Acute renal failure: Creatinine has gone from 1.23 to 1.5 over the past month. This is likely due to p.o. intake. There are no signs of hydronephrosis on CT of the abdomen. We will replace fluids.  4.  Diabetes mellitus with elevated blood sugars: Will continue standing Levemir with sliding scale. Check a hemoglobin A1c.   5.  Chronic obstructive pulmonary disease: No acute COPD exacerbation at this time. Continue with home nebulizers.  6.  Atrial fibrillation: Heart rate is well controlled at this time, no changes to home regimen. 7.  Hypertension: The patient is slightly hypotensive today. Will hold his antihypertensive medications and replace fluids.  8.  Congestive heart failure: The patient not having a florid heart failure exacerbation at this time. Note that his last 2-D echocardiogram was January 22 and showed preserved ejection fraction with mild left ventricular hypertrophy, possible mild diastolic dysfunction. He does not have systolic dysfunction.  9.  Obstructive sleep apnea: Continue CPAP while in hospital.  10.  Coronary artery disease. Continue with statin, nitrates, and beta blockers as blood pressure will tolerate.  11.  Benign prostatic hypertrophy: Agree with urology recommendation to continue his BPH medications and check a post void residual.  12.  Prophylaxis: The patient is on Eliquis, will continue this for DVT prophylaxis. Protonix for gastrointestinal prophylaxis.   TIME SPENT ON ADMISSION: 45 minutes.    ____________________________ Earleen Newport. Volanda Napoleon, MD cpw:bu D: 06/03/2014 20:50:37 ET T: 06/03/2014 21:19:16 ET JOB#: [id number]  cc: Barnetta Chapel P. Volanda Napoleon, MD, <Dictator> Aldean Jewett MD ELECTRONICALLY SIGNED 06/04/2014 8:35

## 2014-08-11 NOTE — Consult Note (Signed)
Chief Complaint:  Subjective/Chief Complaint Cr continues to rise to 1.9, CT abd/ pelvis ordered showing left moderate hydronephrosis with 12 mm distal ureteral stone.  Previous renal ultrasound was negative for hydronpephrosis.  No flank pain.  Unclear if stone recently dropped into ureter or has been present since admission but no hydronephrosis due to septics picture/ hypovolemic state.  Stone also not visible on previous KUB.  Fungemia/ funguria with candidia, no antifungal therapy.   VITAL SIGNS/ANCILLARY NOTES: **Vital Signs.:   13-Jan-16 05:27  Vital Signs Type Routine  Temperature Temperature (F) 97.4  Celsius 36.3  Temperature Source oral  Pulse Pulse 63  Respirations Respirations 20  Systolic BP Systolic BP 920  Diastolic BP (mmHg) Diastolic BP (mmHg) 70  Mean BP 83  Pulse Ox % Pulse Ox % 99  Pulse Ox Activity Level  At rest  Oxygen Delivery 1L  *Intake and Output.:   Daily 13-Jan-16 07:00  Grand Totals Intake:  460 Output:  1050    Net:  -34 24 Hr.:  -590  Oral Intake      In:  360  IV (Secondary)      In:  100  Urine ml     Out:  1050  Length of Stay Totals Intake:  5255 Output:  1875    Net:  1007   Brief Assessment:  GEN critically ill appearing   Respiratory normal resp effort  no use of accessory muscles   Gastrointestinal Normal   Gastrointestinal details normal Soft  Nontender  Nondistended  No masses palpable  No rebound tenderness   Additional Physical Exam Circimcision wound c/d/i, sutures in place, healing well, no concern for infection   Lab Results: Routine Chem:  13-Jan-16 04:16   Glucose, Serum  236  BUN  27  Creatinine (comp)  1.94  Sodium, Serum  131  Potassium, Serum 3.7  Chloride, Serum 100  CO2, Serum 24  Calcium (Total), Serum  7.6  Anion Gap 7  Osmolality (calc) 275  eGFR (African American)  43  eGFR (Non-African American)  36 (eGFR values <48m/min/1.73 m2 may be an indication of chronic kidney disease (CKD). Calculated  eGFR, using the MRDR Study equation, is useful in  patients with stable renal function. The eGFR calculation will not be reliable in acutely ill patients when serum creatinine is changing rapidly. It is not useful in patients on dialysis. The eGFR calculation may not be applicable to patients at the low and high extremes of body sizes, pregnant women, and vegetarians.)  Routine Coag:  13-Jan-16 04:16   Prothrombin  18.6  INR 1.6 (INR reference interval applies to patients on anticoagulant therapy. A single INR therapeutic range for coumarins is not optimal for all indications; however, the suggested range for most indications is 2.0 - 3.0. Exceptions to the INR Reference Range may include: Prosthetic heart valves, acute myocardial infarction, prevention of myocardial infarction, and combinations of aspirin and anticoagulant. The need for a higher or lower target INR must be assessed individually. Reference: The Pharmacology and Management of the Vitamin K  antagonists: the seventh ACCP Conference on Antithrombotic and Thrombolytic Therapy. CHQRFX.5883Sept:126 (3suppl): [id number] A HCT value >55% may artifactually increase the PT.  In one study,  the increase was an average of 25%. Reference:  "Effect on Routine and Special Coagulation Testing Values of Citrate Anticoagulant Adjustment in Patients with High HCT Values." American Journal of Clinical Pathology 2006;126:400-405.)  Routine Hem:  13-Jan-16 04:16   WBC (CBC)  11.1  RBC (  CBC)  4.19  Hemoglobin (CBC)  11.5  Hematocrit (CBC)  35.6  Platelet Count (CBC) 178  MCV 85  MCH 27.4  MCHC 32.3  RDW  17.3  Neutrophil % 80.4  Lymphocyte % 9.8  Monocyte % 8.6  Eosinophil % 0.9  Basophil % 0.3  Neutrophil #  9.0  Lymphocyte # 1.1  Monocyte # 1.0  Eosinophil # 0.1  Basophil # 0.0 (Result(s) reported on 24 Apr 2014 at 04:57AM.)   Radiology Results: CT:    12-Jan-16 20:25, CT Abdomen and Pelvis Without Contrast  CT Abdomen  and Pelvis Without Contrast   REASON FOR EXAM:    (1) lower abdominal pain, distension,  fungemia; (2)   same;    NOTE: Nursing to G  COMMENTS:       PROCEDURE: CT  - CT ABDOMEN AND PELVIS W0  - Apr 23 2014  8:25PM     CLINICAL DATA:  Lower abdominal pain and distention.    EXAM:  CT ABDOMEN AND PELVIS WITHOUT CONTRAST    TECHNIQUE:  Multidetector CT imaging of the abdomen and pelvis was performed  following the standard protocol without IV contrast.  COMPARISON:  CT scan of Aug 25, 2013.    FINDINGS:  Degenerative disc disease is noted at L4-5. Mild subsegmental  atelectasis is noted posteriorly in the left lower lobe. 6 mm nodule  is noted posteriorly in the left lower lobe, with adjacent 8 mm  nodule.    Large gallstones are noted. No focal abnormality is noted in the  liver or pancreas on these unenhanced images. Wedge-shaped low  density is noted superiorly in the spleen concerning for infarction.  Adrenal glands appear normal. Bilateral nephrolithiasis is again  noted. Moderate left hydroureteronephrosis is noted with perinephric  stranding secondary to 12 x 7 mm calculus in distal left ureter. The  appendix appears normal. There is no evidence of bowel obstruction.  No abnormal fluid collection is noted. Urinary bladder appears  normal. Mild prostatic hypertrophy is noted. No significant  adenopathy is noted. Atherosclerotic calcifications of abdominal  aorta are noted without aneurysm formation. Mild anasarca is noted.     IMPRESSION:  Cholelithiasis.    Wedge-shaped low density is noted superiorly in the spleen  concerning for infarction of indeterminate age.    Bilateral nephrolithiasis.    Moderate left hydroureteronephrosis is noted secondary to 12 x 7 mm  calculus in distal left ureter.  Two pulmonary nodules are noted posteriorly in the left lower lobe,  the largest measuring approximately 8 mm. These are not  significantly changed compared to prior exam.  If the patient is at  high risk for bronchogenic carcinoma, follow-up chest CT at  3-95month is recommended. If the patient is at low risk for  bronchogenic carcinoma, follow-up chest CT at 6-12 months is  recommended. This recommendation follows the consensus statement:  Guidelines for Management of Small Pulmonary Nodules Detected on CT  Scans: A Statement from the FWarren Parkas published in  Radiology 2005; 237:395-400.      Electronically Signed    By: JSabino DickM.D.    On: 04/23/2014 20:49     Verified By: JMarveen Reeks M.D.,   Assessment/Plan:  Assessment/Plan:  Assessment 81yo M admitted with fungemia/ funguria sepsis s/p office circumcision, borderline PVRs (200s) and rising Cr to 1.9 from 1.65 on admission s/p CT scan demostrating 12 mm left distal ureteral stone, hydronephrosis.  This stone was not apparent on previous KUB  and no hydronephrosis on ultrasound.  Recommend ureteral stent placement for decompression of the kidney in the setting of infection/ obstruction.  Patient has previously had a stent and understands the risks of the procedure.  Circ wound continues to look well.   Plan -npo -add on for left ureteral stent placement -continue IV antifungal therapy  -may leave Foley post op to maximize urinary drainage, optimize renal function in the setting of borderine PVRs   Electronic Signatures: Sherlynn Stalls (MD)  (Signed 13-Jan-16 10:58)  Authored: Chief Complaint, VITAL SIGNS/ANCILLARY NOTES, Brief Assessment, Lab Results, Radiology Results, Assessment/Plan   Last Updated: 13-Jan-16 10:58 by Sherlynn Stalls (MD)

## 2014-08-11 NOTE — Discharge Summary (Signed)
PATIENT NAME:  [name], [name] MR#:  [id number] DATE OF BIRTH:  01-16-1932  DATE OF ADMISSION:  05/02/2014 DATE OF DISCHARGE:  05/06/2014  DISCHARGE DIAGNOSES: 1. Acute cerebral vascular accident, embolic cerebral vascular accident.  2. Atrial fibrillation.  3. Hypertension.  4. Type 2 diabetes mellitus. 5. Hyperlipidemia.  6. Benign prostatic hypertrophy.   DISCHARGE MEDICATIONS:  1. Albuterol 90 mcg 2 puffs every 6 hours for trouble breathing.  2. Lumigan 0.03% one drop both eyes once a day. 3. Tamsulosin 0.4 mg once a day.  4. Tylenol 650 mg q. 8 hours,  5. Symbicort 160/4.5 two puffs b.i.d.  6. Potassium chloride 10 mEq 3 tablets once a day.  7. Fish oil 1 capsule t.i.d.  8. Percocet 5/325 one capsule every 4 hours as needed for moderate pain, 4 to 6/10. 9. Lisinopril 10 mg p.o. daily.  10. Levemir 50 units at bedtime.  11. Voriconazole 250 mg q. 12 hours for 10 days. The patient was taking before he came.  12. Metoprolol 12.5 mg b.i.d. 13. Cardizem CD 120 mg daily.  14. Furosemide 40 mg p.o. daily.  15. Celexa 10 mg p.o. daily.  16. Xanax 0.25 mg at bedtime.  17. Prednisone 10 mg p.o. daily.  18. Aspirin 81 mg daily.  19. Cipro 500 mg q. 12 hours for 10 days.  20. Coumadin 4 mg p.o. daily.  21. Atorvastatin 40 mg p.o. daily.   The patient was discharged home with physical therapy and outpatient therapy and a nurse.   DIET: Low-sodium, low-fat, ADA diet.   FOLLOWUP INSTRUCTIONS: The patient will follow up with Dr. Rockey Situ in about a week regarding Eliquis for atrial fibrillation and CVA. The patient will possibly need to be started on Eliquis for a history of atrial fibrillation and CVA.   CONSULTATIONS: Neurology consult with Dr. Irish Elders.   DISCHARGE VITAL SIGNS: Temperature 97.5, heart rate 59, blood pressure 132/60. With saturations 96% on room air.   PHYSICAL EXAMINATION AT THE TIME OF DISCHARGE:  GENERAL: The patient was awake, alert, and oriented.   CARDIOVASCULAR SYSTEM: S1, S2 regular.  LUNGS: Clear to auscultation.  NEUROLOGICAL: The patient is oriented to time, place, person. Cranial nerves II through XII intact. The patient's power  is 5/5 in upper and lower extremities.   HOSPITAL COURSE: 1. Acute cerebral vascular accident. An 81 year old male patient admitted because of  transient confusion and visual disturbance and right-sided weakness. The patient did not have any other changes. The patient did not have any other urological changes, except for confusion and vision changes. The patient's MRI showed multiple embolic strokes, more on the left parietal and occipital areas, seen by neurology, Dr. Irish Elders, and the patient was found to have embolic stroke in the setting of atrial fibrillation. No hemodynamically significant stenosis on carotid ultrasound. Dr. Irish Elders recommended possibly starting Eliquis as an outpatient. The patient is continued on Coumadin. He was taking Coumadin for atrial fibrillation. At this time, we are continuing Coumadin. The INR is about 2 here. The patient's neurological exam stayed within normal limits. He did not have any further loss of vision. He did not have further confusion. The patient was seen by physical therapy, and they recommended home physical therapy. Family wanted him to go to home. The patient discharged home with home physical therapy, occupational therapy and also nursing.  2. Diabetes mellitus type 2. The patient was getting metformin and Januvia, but metformin has been stopped recently by his primary doctor due to renal insufficiency.  He was started on Levemir here, and the patient never had Levemir before, so we arranged for nurse gave a Levemir prescription. He was getting 18 units of Levemir. Because he is a new to Levemir, we restarted on 15 and that can be adjusted according to blood glucose response to get fasting glucose around 100.  3. The patient's other diagnoses include chronic atrial  fibrillation, rate is controlled. The patient is on Cardizem and metoprolol. The patient is following up with Dr. Esmond [name] in a week.  4. Hyperlipidemia and history of strokes. He is on aspirin, statins, and Coumadin.  5. Cystitis. Urine culture did not show any growth, but he was symptomatic, so we gave Cipro for 10 days.  6. Laboratory data: Carotid ultrasound showed some mild amount of plaque, and no significant ICA stenosis. The patient's MRI of the brain showed multiple foci of acute infarct in the left parietal greater than the left occipital and the left frontal lobe, likely embolic strokes.  7. Echocardiogram showed EF of 60% to 65%. No source of cerebral vascular accident, and no vegetations are identified.  6. The patient has a history of chronic obstructive pulmonary disease. He is getting a small dose of prednisone on a daily basis, and he is on Symbicort. The patient's oxygen saturation 96% on room air.  7. The patient required hospital semi-electric bed and a shower chair and also a wheelchair, so we gave prescriptions for that.  8. The patient has benign prostatic hypertrophy. He is continued on Flomax.   CONDITION AT DISCHARGE: The patient's condition was stable, and went home in stable condition. Discussed the plan with the patient's wife.   TIME SPENT: More than 30 minutes. [name] and also Dr. Rollene Fare DT urinary tract infection and affect   ____________________________ Epifanio Lesches, MD sk:mw D: 05/07/2014 07:57:52 ET T: 05/07/2014 11:38:38 ET JOB#: [id number]  cc: Epifanio Lesches, MD, <Dictator> Minna Merritts, MD Webb Silversmith, FNP  Epifanio Lesches MD ELECTRONICALLY SIGNED 05/14/2014 13:46

## 2014-08-11 NOTE — Consult Note (Signed)
Chief Complaint:  Subjective/Chief Complaint Improving on IV abx, remains in ICU.  Voiding well, PVRs minimal overnight.  No evidence of hydronephrosis on renal ultrasound 04/21/14.   VITAL SIGNS/ANCILLARY NOTES: **Vital Signs.:   11-Jan-16 08:44  Temperature Temperature (F) 98.3  Celsius 36.8  Temperature Source oral    09:13  Pulse Pulse 99  Respirations Respirations 28  Systolic BP Systolic BP 034  Diastolic BP (mmHg) Diastolic BP (mmHg) 73  Mean BP 89  Pulse Ox % Pulse Ox % 97  Pulse Ox Activity Level  At rest  Oxygen Delivery 3L; Nasal Cannula   Brief Assessment:  GEN critically ill appearing   Respiratory normal resp effort  no use of accessory muscles   Gastrointestinal Normal   Gastrointestinal details normal Soft  Nontender  Nondistended  No masses palpable  No rebound tenderness   Additional Physical Exam Circimcision wound c/d/i, sutures in place, healing well, no concern for infection   Lab Results:  Routine Micro:  09-Jan-16 14:46   Micro Text Report WOUND AER/ANAEROBIC CULT   COMMENT                   HOLDING FOR POSSIBLE PATHOGEN   GRAM STAIN                FEW WHITE BLOOD CELLS   GRAM STAIN                MANY GRAM VARIABLE RODS   GRAM STAIN                MODERATE YEAST   GRAM STAIN          FEW GRAM POSITIVE COCCI IN PAIRS   ANTIBIOTIC                       Micro Text Report URINE CULTURE   COMMENT                   COLONIES TOO SMALL TO READ   ANTIBIOTIC                       Specimen Source CIRCUMSISION SITE  Specimen Source IN/OUT CATH  Culture Comment HOLDING FOR POSSIBLE PATHOGEN  Culture Comment COLONIES TOO SMALL TO READ  Result(s) reported on 21 Apr 2014 at 10:45AM.  Gram Stain 1 FEW WHITE BLOOD CELLS  Gram Stain 2 MANY GRAM VARIABLE RODS  Gram Stain 3 MODERATE YEAST  Gram Stain 4 FEW GRAM POSITIVE COCCI IN PAIRS  Result(s) reported on 21 Apr 2014 at 12:34PM.  Routine Chem:  11-Jan-16 04:36   Creatinine (comp)  1.71  eGFR (African  American)  50  eGFR (Non-African American)  41 (eGFR values <11m/min/1.73 m2 may be an indication of chronic kidney disease (CKD). Calculated eGFR, using the MRDR Study equation, is useful in  patients with stable renal function. The eGFR calculation will not be reliable in acutely ill patients when serum creatinine is changing rapidly. It is not useful in patients on dialysis. The eGFR calculation may not be applicable to patients at the low and high extremes of body sizes, pregnant women, and vegetarians.)  Routine UA:  09-Jan-16 14:46   Color (UA) Yellow  Clarity (UA) Turbid  Glucose (UA) >=500  Bilirubin (UA) Negative  Ketones (UA) Trace  Specific Gravity (UA) 1.021  Blood (UA) 2+  pH (UA) 5.0  Protein (UA) 30 mg/dL  Nitrite (UA) Negative  Leukocyte Esterase (UA)  3+ (Result(s) reported on 20 Apr 2014 at 03:19PM.)  RBC (UA) 221 /HPF  WBC (UA) 825 /HPF  Bacteria (UA) NONE SEEN  Epithelial Cells (UA) NONE SEEN  WBC Clump (UA) PRESENT  Budding Yeast (UA) PRESENT (Result(s) reported on 20 Apr 2014 at 03:19PM.)   Assessment/Plan:  Assessment/Plan:  Assessment Assessment:  1. Sepsis - likely due to cystitis -improving on IV Vanc and Zosyn -blood culture negative, urine culture punctate colonies, pending  2. BPH/Voiding Inefficiency -the likely cause of the UTI -PVR less than 200 overnight, ok to defer Foley if PVRs low -may need catheter if Cr continues to trend upwards  3. 6 days s/p Circumcision -healing well without sx's/signs of infection, unlikely etiology of infection  4. Urolithiasis -no clinical colic -no hydronephrosis/ obstructing stones on RUS 04/21/14  5. ARI - likely due to Sepsis +/- BPH/elevated PVR -improving on IV Abx's without bladder decompression   Plan Recommendations:  1. Continue IV Vanc/Zosyn 2. Continue Flomax/Proscar 3. Monitor Bladder Scan PVR's (please allow pt to void prior to bladder scan) -may require foley if with persistent  elevated PVR's and improvement does not continue or condition worsens or cr continues to rise   Electronic Signatures: Sherlynn Stalls (MD)  (Signed 11-Jan-16 09:53)  Authored: Chief Complaint, VITAL SIGNS/ANCILLARY NOTES, Brief Assessment, Lab Results, Assessment/Plan   Last Updated: 11-Jan-16 09:53 by Sherlynn Stalls (MD)

## 2014-08-11 NOTE — Discharge Summary (Signed)
Dates of Admission and Diagnosis:  Date of Admission 20-Apr-2014   Date of Discharge 26-Apr-2014   Admitting Diagnosis sepsis   Final Diagnosis Sepsis due to pyelonephritis, wound on penus from circumcision and Fungemia- negative blood culture on 04/23/13. Left ureteral stone- stent placed 04/24/13- follow with urology in office Acute renal failure- post obstruction and ATN- improving- follow. A fib with rapid ventricular response - on rate control+ Coumadin.    Chief Complaint/History of Present Illness an 81 year old male with a significant history of chronic atrial fibrillation on Coumadin, COPD, sleep apnea, diabetes and obesity. Underwent circumcision approximately 5 days ago by [facility]. Apparently, the patient was having fevers prior to his circumcision, but proceeded with procedure as scheduled. Presents now with fever and chills. Also complaining of chest pain and shortness of breath. It is noted that the patient was off Coumadin for his procedure and his INR is currently subtherapeutic. He is complaining of chest pain and shortness of breath. Peripheral edema noted. In the emergency room, the patient was noted to be tachycardic with low-grade fever. Also had a marked leukocytosis, consistent with possible sepsis. He is now admitted for further evaluation.   Allergies:  Contrast - Iodinated Radiocontrast Dye: Resp. Distress  Morphine: Alt Ment Status  Niacin: Itching  Routine Micro:  09-Jan-16 13:47   Micro Text Report BLOOD CULTURE   ORGANISM 1                CANDIDA GLABRATA   COMMENT                   IN AEROBIC AND ANAEROBIC BOTTLES   COMMENT                   SENDING TO [facility] FOR FLUCONAZOLE AND   COMMENT                   VORICONIZOLE SENSITIVITIES PER DR. FITZGERALD   GRAM STAIN                YEAST   ANTIBIOTIC                       Micro Text Report BLOOD CULTURE   ORGANISM 1                CANDIDA GLABRATA   COMMENT                   IN AEROBIC BOTTLE  ONLY   COMMENT                   REFER TO OTHER SET FOR SENSITIVITIES   GRAM STAIN                YEAST   ANTIBIOTIC                        Culture Comment IN AEROBIC AND ANAEROBIC BOTTLES  Culture Comment IN AEROBIC BOTTLE ONLY  Specimen Source right arm  Specimen Source left arm  Organism Name CANDIDA GLABRATA  Organism Name CANDIDA GLABRATA  Organism 1 CANDIDA GLABRATA  Organism 1 CANDIDA GLABRATA  Culture Comment . SENDING TO [facility] FOR FLUCONAZOLE AND  Culture Comment . REFER TO OTHER SET FOR SENSITIVITIES  Gram Stain 1 YEAST  Gram Stain 1 YEAST  Culture Comment    . VORICONIZOLE SENSITIVITIES PER DR. FITZGERALD    14:46   Micro Text Report WOUND AER/ANAEROBIC CULT  ORGANISM 1                MODERATE GROWTH ENTEROCOCCUS FAECALIS   ORGANISM 2                MODERATE GROWTH CANDIDA ALBICANS   ORGANISM 3                HEAVY GROWTH CANDIDA GLABRATA   COMMENT                   REFER TOBLOOD CULTURE FOR ORG#3 SENSITIVITIES   COMMENT                   NO ANAEROBES ISOLATED IN 4 DAYS   GRAM STAIN                FEW WHITE BLOOD CELLS   GRAM STAIN                MANY GRAM VARIABLE RODS   GRAM STAIN                MODERATE YEAST   GRAM STAIN                FEW GRAM POSITIVE COCCI IN PAIRS   ANTIBIOTIC                    ORG#1     AMPICILLIN                    S         LINEZOLID                     S  Micro Text Report URINE CULTURE   ORGANISM 1                >100,000 CFU/ML Candida glabrata   COMMENT                   -   COMMENT                   -   ANTIBIOTIC                    ORG#1       Culture Comment REFER TO BLOOD CULTURE FOR ORG#3 SENSITIVITIES  Culture Comment -  Specimen Source [place]  Specimen Source IN/OUT CATH  Organism Name CANDIDA GLABRATA  Organism Name [place] Name ENTEROCOCCUS FAECALIS  Organism Name Candida glabrata  Organism Quantity HEAVY GROWTH  Organism Quantity MODERATE GROWTH  Organism Quantity MODERATE GROWTH   Organism Quantity >100,000 CFU/ML  Ampicillin Sensitivity S  Linezolid Sensitivity S  Organism 1 MODERATE GROWTH ENTEROCOCCUS FAECALIS  Organism 1 >100,000 CFU/ML Candida glabrata  Organism 2 MODERATE GROWTH CANDIDA ALBICANS  Organism 3 HEAVY GROWTH CANDIDA GLABRATA  Culture Comment . NO ANAEROBES ISOLATED IN 4 DAYS  Culture Comment . - (Result(s) reported on 24 Apr 2014 at 10:04AM.)  Gram Stain 1 FEW WHITE BLOOD CELLS  Gram Stain 2 MANY GRAM VARIABLE RODS  Gram Stain 3 MODERATE YEAST  Gram Stain 4 FEW GRAM POSITIVE COCCI IN PAIRS  Result(s) reported on 25 Apr 2014 at 10:16AM.  12-Jan-16 10:44   Micro Text Report BLOOD CULTURE   COMMENT                   NO GROWTH IN 48 HOURS   ANTIBIOTIC  Culture Comment NO GROWTH IN 48 HOURS  Result(s) reported on 25 Apr 2014 at 10:00AM.  Specimen Source right hand    12:09   Micro Text Report BLOOD CULTURE   COMMENT                   NO GROWTH IN 48 HOURS   ANTIBIOTIC                       Culture Comment NO GROWTH IN 48 HOURS  Result(s) reported on 25 Apr 2014 at 12:00PM.  Routine Chem:  09-Jan-16 12:57   Creatinine (comp)  1.65  10-Jan-16 04:32   Creatinine (comp)  1.46  11-Jan-16 04:36   Creatinine (comp)  1.71  12-Jan-16 04:16   Creatinine (comp)  1.87  13-Jan-16 04:16   Creatinine (comp)  1.94  14-Jan-16 04:33   Creatinine (comp)  1.69  15-Jan-16 04:51   Creatinine (comp)  1.55   Pertinent Past History:  Pertinent Past History 1. Status post recent circumcision.  2. Previous stroke.  3. Chronic atrial fibrillation.  4. Hyperlipidemia.  5. Obesity.  6. Benign hypertension.  7. Type 2 diabetes.  8. ASCVD status post PTCA with stent placement x 2.  9. BPH.   Hospital Course:  Hospital Course 1. Sepsis, due to yeast in blood and urine, now on micafungin IV, awaiting for yeast  ID , sensitivities,    As per Dr. Ola Spurr- wil d/c on 2 weeks from neg Bl cx ( 04/23/13) - with voriconazol 200 mg  BID. 2.acute on chronci renal failure, likley due to ATN, also likely post obstruction, noted to have L ureteral 12 mm stone, DR Erlene Quan did stent placement to L ureter 04/24/13 , continue flomax, finasteride, holding ACEI, some improvement in renal func. 3. acute pyelonephritis, urine cx yeast, due  to fungemia? on voriconazol on d/c. 4. chest pain, resolved, no cardiac injury, was tachycardic , but likley due to fevers, continue metoprolol. fevers subsided. HR better controled today.  5. dyspnea, no PE on VQ, now on 1  liter of O2  per [place], wean to RA as tolerated, continue lasix as needed for acute on chronic diastolic CHF, d/w cardiology.  6 a.fib RVR, due to fever, infection, continue metoprolol, cardizem, watching BP closely, on coumadin- pharmacy to dose. 7 ureteral sone with L hydoureteronephrosis, DR Erlene Quan ( urology) is involved, stent placed 1.13.16. much improved.   follow as out pt.   Condition on Discharge Stable   DISCHARGE INSTRUCTIONS HOME MEDS:  Medication Reconciliation: Patient's Home Medications at Discharge:     Medication Instructions  celexa 10 mg oral tablet  1 tab(s) orally once a day   albuterol cfc free 90 mcg/inh inhalation aerosol  2 puff(s) inhaled every 6 hours, As Needed - for Shortness of Breath   prednisone 10 mg oral tablet  1 tab(s) orally once a day with breakfast.   lumigan 0.03% ophthalmic solution  1 drop(s) into both eyes once a day (at bedtime)   tamsulosin 0.4 mg oral capsule  1 cap(s) orally once a day   acetaminophen 650 mg oral tablet, extended release  1 tab(s) orally every 8 hours, As Needed - for Pain   alprazolam 0.25 mg oral tablet  1 tab(s) orally once a day (at bedtime) as needed for anxiety.   atorvastatin 40 mg oral tablet  1 tab(s) orally once a day   budesonide-formoterol 160 mcg-4.5 mcg/inh inhalation aerosol  2 puff(s) inhaled 2  times a day   finasteride 5 mg oral tablet  1 tab(s) orally once a day   isosorbide dinitrate 40 mg oral  tablet  1 tab(s) orally 3 times a day   nitroglycerin 0.4 mg sublingual tablet  1 tab(s) sublingual every 5 minutes as needed for chest pain.   potassium chloride 10 meq oral tablet, extended release  3 tabs (68mq) orally once a day.   fish oil - oral capsule  1 cap(s) orally 3 times a day   acetaminophen-hydrocodone 325 mg-5 mg oral tablet  1 tab(s) orally every 4 hours, As Needed, moderate pain (4-6/10) , As needed, moderate pain (4-6/10)   lisinopril 10 mg oral tablet  1 tab(s) orally once a day   warfarin 1 mg oral tablet  4 tab(s) orally    insulin detemir 100 units/ml subcutaneous solution  15 unit(s) subcutaneous once a day (at bedtime)   voriconazole 200 mg oral tablet  1 tab(s) orally every 12 hours x 10 days   metoprolol  12.5 milligram(s) orally every 12 hours   diltiazem  120 milligram(s) orally once a day   furosemide 40 mg oral tablet  1 tab(s) orally once a day   simethicone 80 mg oral tablet, chewable  1 tab(s) orally 4 times a day (before meals and at bedtime)    STOP TAKING THE FOLLOWING MEDICATION(S):    aspirin 81 mg oral tablet: 1 tab(s) orally once a day glimepiride 4 mg oral tablet: 1 tab(s) orally once a day (in the morning) and 2 tabs ('8mg'$ ) orally once a day (at bedtime) metformin 1000 mg oral tablet: 1 tab(s) orally 2 times a day (with meals) nystatin-triamcinolone topical 100000 units/g-0.1% topical cream: Apply topically to affected area 2 times a day sitagliptin 50 mg oral tablet: 1 tab(s) orally once a day tiotropium 18 mcg inhalation capsule: 1 cap(s) via handihaler once a day lovenox 100 mg/ml injectable solution: 1 milliliter(s) injectable every 12 hours x 3 days on 04/18/14, 04/19/14, 04/20/14.  Physician's Instructions:  Diet Low Sodium  Carbohydrate Controlled (ADA) Diet   Activity Limitations As tolerated   Return to Work Not Applicable   Time frame for Follow Up Appointment 1-2 weeks  urology   Time frame for Follow Up Appointment 1-2 weeks   Infectious  disease.   Time frame for Follow Up Appointment 1-2 weeks  cardiology   TIME SPENT:  Total Time: Greater than 30 minutes   Electronic Signatures: VVaughan Basta(MD)  (Signed 15-Jan-16 14:46)  Authored: ADMISSION DATE AND DIAGNOSIS, CHIEF COMPLAINT/HPI, Allergies, PERTINENT LABS, PERTINENT PAST HISTORY, HOSPITAL COURSE, DISCHARGE INSTRUCTIONS HOME MEDS, PATIENT INSTRUCTIONS, TIME SPENT   Last Updated: 15-Jan-16 14:46 by VVaughan Basta(MD)

## 2014-08-11 NOTE — Op Note (Signed)
PATIENT NAME:  [name], [name] MR#:  [id number] DATE OF BIRTH:  1932/02/01  DATE OF PROCEDURE:  04/24/2014  PREOPERATIVE DIAGNOSIS: Left obstructing distal ureteral stone.   POSTOPERATIVE DIAGNOSIS: Left obstructing distal ureteral stone.  PROCEDURES PERFORMED: Cystoscopy, left ureteral stent placement, Foley catheter placement.   ATTENDING SURGEON: Sherlynn Stalls, MD.   ANESTHESIA: General anesthesia.   ESTIMATED BLOOD LOSS: Minimal.   DRAINS: A 6 x 26 French double-J ureteral stent on the left, a  16 French coude Foley catheter.   SPECIMENS: None.   COMPLICATIONS: None.   INDICATIONS FOR PROCEDURE: This is an 81 year old male with a history of phimosis and nephrolithiasis, recurrent urinary tract infections, who was admitted with sepsis, found to have fungemia and funguria. His creatinine continued to worsen up to 1.9 during the admission and although renal ultrasound and KUB showed no evidence of obstructing stone, on admission, a CT scan did show a 12 mm left distal ureteral stone with moderate left hydronephrosis. Given his active infection and worsening renal function, he was counseled to undergo urgent ureteral stent placement. Risks and benefits of the procedure explained in detail. The patient agreed to proceed as planned.   DESCRIPTION OF PROCEDURE: The patient was correctly identified in the preoperative holding area and informed consent was confirmed. He was brought to the operating suite and placed on the table in supine position. At this time, universal timeout protocol was performed. All team members were identified. Venodyne boots were placed, and he was administered no additional antibiotics, as he is currently actively on antibiotics on the floor. At this point in time, he was placed under general anesthesia with mask LMA airway, repositioned on the bed in the dorsal lithotomy position and prepped and draped in standard surgical fashion. A rigid 63 French cystoscope was  advanced per urethra into the bladder. Of note, he did have a significant amount of debris, white and somewhat purulent, consistent with candiduria, which was evacuated. Attention was then turned to the left ureteral orifice, which was then cannulated using a 5 [place] and ureteral catheters just within the UO, and I was able to pass a sensor wire up to the level of the kidney without much difficulty. Then, I advanced a 6 x 26 French double-J ureteral stent over the wire up to the level of the renal pelvis. The wire was partially withdrawn and a coil was noted within the renal pelvis. The wire was fully withdrawn and a coil was noted within the bladder. Upon ureteral stent placement, a copious amount of purulent urine did efflux out of the left UO. The bladder was drained. A 16 French coude Foley catheter was placed. His urine was slightly hematuric and very purulent at this point, I did place 2 additional interrupted 3-0 chromic sutures at the site of the circumcision site, where the past sutures had fallen out. The patient was then repositioned in the supine position, reversed from anesthesia, and taken to the PACU in stable condition. There were no complications.    ____________________________ Sherlynn Stalls, MD [id number] D: 04/25/2014 09:08:33 ET T: 04/25/2014 12:27:14 ET JOB#: [id number]  cc: Sherlynn Stalls, MD, <Dictator> Sherlynn Stalls MD ELECTRONICALLY SIGNED 05/15/2014 14:23

## 2014-08-11 NOTE — Consult Note (Signed)
Admit Diagnosis:   UTI: Onset Date: 03-Jun-2014, Status: Active, Description: UTI   General Aspect 81 yo M with multiple medical problems including recent CVA, COPD, Afib, DM2, s/p recent left ureteral stent placement 04/24/14 for obstructing 12 mm ureteral stone, history of candidemia, recurrent UTIs, nephrolithiasis, phimosis s/p recent circumcion who presents to the ED to day with malaise x 2 days, dysuria, and cloudy urine.  In the ED, his WBC are elevated to 17, UA floridly positve, Cr 1.50 (Cr baseline 1.26 in 01/2014).   He did call the office last week complaining of dysuria and urine culture was negative for bacteria.    He does note that his urine is darker during the day and clears in the AM.  Over the past 2 days, it has become cloudy and "mud" colored.  No clots.  He does feel that he is able to empty his bladder.    CT scan shows thickened bladder with surrouding inflammation, stent in good position, large left lower pole stone burden and large ureteral stone on left along side of the stent.     Admission/Visit Status,  Inpatient; Admitting Hospitalist: CATHERINE WALSHPreferred Unit: Any Med/Surg Unit   Diagnosis: uti, N39.0 Urinary tract infection-Condition:Fair  I certify that: I expect the patient to need inpatient services for at least 2 midnights.  Date of Admission: 03-Jun-2014, 03-Jun-2014, Active, Standard   CBC Profile, STAT  Draw Date:03-Jun-2014, 03-Jun-2014, 1 or more Final Results Received, Standard   Comprehensive Metabolic Panel, STAT  Draw Date:03-Jun-2014, 03-Jun-2014, 1 or more Final Results Received, Standard   Prothrombin Time, STAT  Draw Date:03-Jun-2014, 03-Jun-2014, 1 or more Final Results Received, Standard   Basic Metabolic Panel (w/Total Calcium), Routine  Draw Date:04-Jun-2014, 04-Jun-2014, Pending, Standard   CBC Profile, Routine  Draw Date:04-Jun-2014, 04-Jun-2014, Pending, Standard   Urinalysis, STAT-Clean Catch  Date to  Collect:03-Jun-2014, 03-Jun-2014, 1 or more Final Results Received, Standard   Urine Culture, STAT-Clean Catch, 03-Jun-2014, Specimen Received by Performing Department, Standard   Sodium Chloride 0.9%, 1000 ml at 125 ml/hr, 03-Jun-2014, Active, Standard   cefTRIAXone injection,  ( Rocephin injection )  2 gram in Dextrose 5% 100 ml, IV Piggyback, STAT, Infuse over 30 minute(s)  Indication: Infection, 03-Jun-2014, Completed, Standard   Acetaminophen * tablet, ( Tylenol (325 mg) tablet)  650 mg Oral q4h PRN for mild pain (1-3/10) or temp. greater than 100.4  - Indication: Pain/Fever, 03-Jun-2014, Active, Standard   Ondansetron injection, ( Zofran injection )  4 mg, IV push, q4h PRN for Nausea/Vomiting  Indication: Nausea/ Vomiting, 03-Jun-2014, Active, Standard   Finasteride tablet, ( Proscar)  5 mg Oral daily  - Indication: Benign Prostatic Hyperplasia, 03-Jun-2014, Active, Standard   Insulin Detemir injection, ( Levemir )  24 unit(s), Subcutaneous, q24h  Special Instructions: Dustin Folks Code: Black], 03-Jun-2014, Active, Standard   Tamsulosin capsule, ( Flomax)  0.4 mg Oral at bedtime  - Indication: Symptoms of Benign Prostatic Hyperplasma, 03-Jun-2014, Active, Standard   Budesonide-Formoterol 160/4.5 mcg HFAA Inhaler, ( Symbicort 160/4.5 inhaler )  2 puff(s) Inhalation bid  -Indication:asthma  Instructions:  Dustin Folks Code: SendToRx]  Inhaler must be reprimed if it has been dropped or not used in 7 days or more.  Shake well for 5 seconds before each use., 03-Jun-2014, Active, Standard   Tiotropium Handihaler 5'S, ( SPiriVA Handihaler )  1 capsule(s) Inhalation daily  Capsules are for use in inhaler - not for oral use., 03-Jun-2014, Active, Standard   atorvaSTATin tablet, 40 mg Oral daily  - Indication: Hypercholesterolemia,  03-Jun-2014, Active, Standard   ALPRAZolam tablet, 0.25 mg Oral at bedtime PRN for sleep  - Indication: Anxiety/ Depression/ Panic, 03-Jun-2014, Active,  Standard   Isosorbide Dinitrate tablet, ( Isordil)  20 mg Oral tid  - Indication: Angina, 03-Jun-2014, Active, Standard   Insulin SS -Novolog injection, Subcutaneous, FSBS before meals and at bedtime  give no insulin if FSBS 0 - 150     2 unit(s) if FSBS 151 - 200     4 unit(s) if FSBS 201 - 250     6 unit(s) if FSBS 251 - 300     8 unit(s) if FSBS 301 - 350     10 unit(s) if FSBS 351 - 400  Call MD if Blood Glucose greater than 400  [Waste Code: Black], 03-Jun-2014, Active, Standard   Chest PA and Lateral, Stat-STAT-Cough x several days, fever, 03-Jun-2014, 1 or more Final Results Received, Standard   CT Abdomen Pelvis WO for Stone, STAT-dysuria, hematuria, history of kidney stones, 03-Jun-2014, 1 or more Final Results Received, Standard   Lactic Acid, Venous, Cardiopulmonary, STAT   Reason: chills, uti, 03-Jun-2014, Corrected Results, Standard   Albuterol SVN, Initiate Respiratory Therapy Protocol  2.5 mg SVN q6h PRN for shortness of breath  CP will dilute in standard solution (NS 18m), 03-Jun-2014, Active, Standard   Pastoral Care Referral/Consult, ROUTINE - requires response within 24 hours  Reason for-Patient requests [facility] visits while hospitalized., 03-Jun-2014, Active, Standard   Respiratory Therapy Consult, ROUTINE - requires response within 24 hours  Reason for consult. Aerosol Therapy, 03-Jun-2014, In Progress, Standard   NPO, 03-Jun-2014, Active, Standard   I&O, Measure and Record Q 8hr, 03-Jun-2014, Active, Standard   Pneumatic/Compression Hose (SCD), 03-Jun-2014, Active, Standard   TED Hose Knee High, 03-Jun-2014, Active, Standard   Vital Signs, Q 8hr, 03-Jun-2014, Active, Standard   Activity, Activity as tolerated, encourage ea, 03-Jun-2014, Active, Standard   Initiate Hypoglycemia Protocol for Non-Pregnant Adults with Diabetes, for CBG less than 71mdl, 03-Jun-2014, Active, Standard  Home Medications: Medication Instructions Status  levofloxacin 500  mg oral tablet 1 tab(s) orally once a day Active  albuterol CFC free 90 mcg/inh inhalation aerosol 2 puff(s) inhaled every 6 hours, As Needed - for Shortness of Breath Active  Lumigan 0.03% ophthalmic solution 1 drop(s) to each affected eye once a day (at bedtime) Active  nitroglycerin 0.4 mg sublingual tablet 1 tab(s) sublingual every 5 minutes as needed for chest pain. Active  atorvastatin 40 mg oral tablet 1 tab(s) orally once a day (at bedtime) Active  Symbicort 160 mcg-4.5 mcg/inh inhalation aerosol 2 puff(s) inhaled 2 times a day Active  citalopram 10 mg oral tablet 1 tab(s) orally once a day (in the morning) Active  finasteride 5 mg oral tablet 1 tab(s) orally once a day (in the morning) Active  furosemide 40 mg oral tablet 0.5-1 tab(s) orally once a day (in the morning) depending on fluid.  Active  Levemir FlexTouch 100 units/mL subcutaneous solution 24 unit(s) subcutaneous once a day (at bedtime) Active  isosorbide dinitrate 40 mg oral tablet 0.5 tab(s) orally 3 times a day Active  lisinopril 10 mg oral tablet 1 tab(s) orally once a day (in the morning) Active  metoprolol succinate 50 mg oral tablet, extended release 1 tab(s) orally 2 times a day Active  Eliquis 5 mg oral tablet 1 tab(s) orally 2 times a day Active  potassium chloride 10 mEq oral tablet, extended release 1 tab(s) orally 3 times a day Active  predniSONE 10 mg oral  tablet 0.5 tab(s) orally once a day (at bedtime) Active  tamsulosin 0.4 mg oral capsule 1 cap(s) orally once a day (in the morning) Active  nystatin-triamcinolone topical 100000 units/g-0.1% topical cream Apply topically to affected area 2 times a day Active  Januvia 100 mg oral tablet 1 tab(s) orally once a day (in the morning) Active  Spiriva 18 mcg inhalation capsule 1 cap(s) inhaled once a day (in the morning) Active  albuterol 2.5 mg/3 mL (0.083%) inhalation solution 3 milliliter(s) inhaled every 6 hours, As Needed - for Shortness of Breath Active   ALPRAZolam 0.25 mg oral tablet 1 tab(s) orally once a day (at bedtime), As Needed - for Anxiety, Nervousness Active  homatropine-HYDROcodone 1.5 mg-5 mg/5 mL oral syrup 5 milliliter(s) orally every 8 hours, As Needed for cough.  Active  Tylenol 500 mg oral tablet 1 tab(s) orally every 6 hours, As Needed - for Pain Active    Contrast - Iodinated Radiocontrast Dye: Resp. Distress  Morphine: Alt Ment Status  Niacin: Itching  Case History and Physical Exam:  Chief Complaint malaise, fatigue, low grade temps   Past Medical Health Hypertension, Diabetes Mellitus, Stroke, COPD, Other   HEENT PERLA   Neck/Nodes Supple   Chest/Lungs Clear   Breasts WNL   Cardiovascular No Murmurs or Gallops   Abdomen Benign  Rebound tenderness  No CVA tenderness bilaterally   Genitalia WNL  Circumcision well healed   Rectal Not examined   Musculoskeletal Full range of motion   Neurological Grossly WNL   Skin Warm  WNL   Hepatic:  22-Feb-16 12:17   Bilirubin, Total 0.5  Alkaline Phosphatase 92  SGPT (ALT) 18  SGOT (AST)  14  Total Protein, Serum 6.9  Albumin, Serum  2.4  Routine Chem:  22-Feb-16 12:17   Glucose, Serum  313  BUN  26  Creatinine (comp)  1.50  Sodium, Serum  135  Potassium, Serum 4.4  Chloride, Serum 104  CO2, Serum 24  Calcium (Total), Serum 8.8  Osmolality (calc) 287  eGFR (African American)  58  eGFR (Non-African American)  48 (eGFR values <5m/min/1.73 m2 may be an indication of chronic kidney disease (CKD). Calculated eGFR, using the MRDR Study equation, is useful in  patients with stable renal function. The eGFR calculation will not be reliable in acutely ill patients when serum creatinine is changing rapidly. It is not useful in patients on dialysis. The eGFR calculation may not be applicable to patients at the low and high extremes of body sizes, pregnant women, and vegetarians.)  Anion Gap 7    16:45   Result Comment - READ-BACK PROCESS PERFORMED.  -  Dr. WJimmye Norman02/22/16 1657  Result(s) reported on 03 Jun 2014 at 05:02PM.  Routine UA:  22-Feb-16 12:17   Color (UA) Yellow  Clarity (UA) Turbid  Glucose (UA) >=500  Bilirubin (UA) Negative  Ketones (UA) Negative  Specific Gravity (UA) 1.012  Blood (UA) 3+  pH (UA) 5.0  Protein (UA) 100 mg/dL  Nitrite (UA) Negative  Leukocyte Esterase (UA) 3+ (Result(s) reported on 03 Jun 2014 at 01:25PM.)  RBC (UA) 1602 /HPF  WBC (UA) 6037 /HPF  Bacteria (UA) NONE SEEN  Epithelial Cells (UA) NONE SEEN  WBC Clump (UA) PRESENT  Budding Yeast (UA) PRESENT (Result(s) reported on 03 Jun 2014 at 01:25PM.)  Routine Coag:  22-Feb-16 12:17   Prothrombin  17.6 (11.4-15.0 NOTE: New Reference Range  05/10/14)  INR 1.4 (INR reference interval applies to patients on anticoagulant therapy. A single  INR therapeutic range for coumarins is not optimal for all indications; however, the suggested range for most indications is 2.0 - 3.0. Exceptions to the INR Reference Range may include: Prosthetic heart valves, acute myocardial infarction, prevention of myocardial infarction, and combinations of aspirin and anticoagulant. The need for a higher or lower target INR must be assessed individually. Reference: The Pharmacology and Management of the Vitamin K  antagonists: the seventh ACCP Conference on Antithrombotic and Thrombolytic Therapy. JJOAC.1660 Sept:126 (3suppl): [id number]. A HCT value >55% may artifactually increase the PT.  In one study,  the increase was an average of 25%. Reference:  "Effect on Routine and Special Coagulation Testing Values of Citrate Anticoagulant Adjustment in Patients with High HCT Values." American Journal of Clinical Pathology 2006;126:400-405.)  Routine Hem:  22-Feb-16 12:17   WBC (CBC)  17.1  RBC (CBC) 4.96  Hemoglobin (CBC) 13.7  Hematocrit (CBC) 41.3  Platelet Count (CBC) 276 (Result(s) reported on 03 Jun 2014 at 12:32PM.)  MCV 83  MCH 27.6  MCHC 33.1  RDW  17.4   Segmented Neutrophils 75  Lymphocytes 14  Monocytes 11  Diff Comment 1 ANISOCYTOSIS  Diff Comment 2 PLTS VARIED IN SIZE  Diff Comment 3 TOXIC VACUOLIZATION  Result(s) reported on 03 Jun 2014 at 12:32PM.   XRay:    22-Feb-16 13:59, Chest PA and Lateral  Chest PA and Lateral   REASON FOR EXAM:    Cough x several days, fever  COMMENTS:       PROCEDURE: DXR - DXR CHEST PA (OR AP) AND LATERAL  - Jun 03 2014  1:59PM     CLINICAL DATA:  Cough for several days.    EXAM:  CHEST  2 VIEW    COMPARISON:  May 02, 2014.    FINDINGS:  The heart size and mediastinal contours are within normal limits.  Both lungs are clear. No pneumothorax or pleural effusion is noted.  The visualized skeletal structures are unremarkable.     IMPRESSION:  No acute cardiopulmonary abnormality seen.      Electronically Signed    By: Marijo Conception, M.D.    On: 06/03/2014 14:32         Verified By: Marveen Reeks, M.D.,  CT:    22-Feb-16 15:58, CT Abdomen Pelvis WO for Stone  CT Abdomen Pelvis WO for Stone   REASON FOR EXAM:    dysuria, hematuria, history of kidney stones  COMMENTS:       PROCEDURE: CT  - CT ABDOMEN /PELVIS WO (STONE)  - Jun 03 2014  3:58PM     CLINICAL DATA:  Dysuria, hematuria.    EXAM:  CT ABDOMEN AND PELVIS WITHOUT CONTRAST    TECHNIQUE:  Multidetector CT imaging of the abdomen and pelvis was performed  following the standard protocol without IV contrast.    COMPARISON:  CT scan of April 23, 2014.  FINDINGS:  Mild degenerative disc disease is noted at L4-5. 8.5 mm nodule is  noted posteriorly in the left lower lobe.    Multiple large gallstones are noted. No focal abnormality is noted  in the liver, spleen or pancreas on these unenhanced images. Adrenal  glands appear normal. Nonobstructive 5 mm calculus is noted in the  right renal pelvis. Small nonobstructive calculi are noted in lower  pole collecting system of right kidney.  Atherosclerotic  calcifications of abdominal aorta are noted without aneurysm  formation.    There has been interval placement of left-sided ureteral stent in  grossly good position. Continued presence of large calculus is noted  in lower pole collecting system of left kidney. Stable inflammatory  changes in the left para renal space are noted.    The appendix appears normal. There is no evidence of bowel  obstruction. Mild prostatic enlargement is noted. Prostate gland  appears normal. No abnormal fluid collection is noted. Mild wall  thickening inflammatory changes are seen involving the urinary  bladder concerning for possible cystitis. No significant adenopathy  is noted.     IMPRESSION:  Interval placement of left-sided ureteral stent in grossly good  position. Continued presence of large nonobstructive calculus in  lower pole collecting system of left kidney.    Nonobstructive 49m calculus seen in left renal pelvis. No  hydronephrosis or renal obstruction is noted on the right.    Cholelithiasis is noted.    Mild wall thickening and surrounding inflammation is seen involving  the urinary bladder concerning for possible cystitis.    8.5 mm nodule is noted posteriorly in the left lower lobe. If the  patient is at high risk for bronchogenic carcinoma, follow-up chest  CT at 3-629monthis recommended. If the patient is at low risk for  bronchogenic carcinoma, follow-up chestCT at 6-12 months is  recommended. This recommendation follows the consensus statement:  Guidelines for Management of Small Pulmonary Nodules Detected on CT  Scans: A Statement from the [employer] published in  Radiology 2005; 237:395-400.  Electronically Signed    By: JaMarijo ConceptionM.D.    On: 06/03/2014 16:22         Verified By: JAMarveen ReeksM.D.,    Impression [age]o M with many comorbidies s/p left ureteral stent placement for obstructing stone admitted with dysuria, +UA, and  leukocystosis.  Stent in good position per CT scan.  He has had fungemia/ funguria in the past and yeast present on UA.    1. Recommend addition of antifungal mediation  2. No acute intervention for stones in setting of active infection.  Patient has follow up with me next week in the office to discuss definatively management of his stone.  Unfortunately, he is a very poor surgical candidate and high risk of sepsis.    3. History of BPH and urinary retention- please monitor UOP and check post void residual.  He has required a Foley catheter in the past and his Cr is slightly elevated, continue to monitor  Please call with any questions or concerns, I look forward to seeing [name] in the office next week   Electronic Signatures: BrSherlynn StallsMD)  (Signed [id number])  Authored: Health Issues, General Aspect/Present Illness, Orders, Home Medications, Allergies, History and Physical Exam, Labs, Radiology, Impression/Plan   Last Updated: 22-Feb-16 19:35 by BrSherlynn StallsMD)

## 2014-08-11 NOTE — Consult Note (Signed)
Brief Consult Note: Diagnosis: urinary sepsis, nephrolithiasis.   Patient was seen by consultant.   Consult note dictated.   Recommend further assessment or treatment.   Orders entered.   Comments: Cont ceftriaxone Changed fluconazole to micafugin given prior candida glabrata fungemia.  Electronic Signatures: Angelena Form (MD)  (Signed 23-Feb-16 21:29)  Authored: Brief Consult Note   Last Updated: 23-Feb-16 21:29 by Angelena Form (MD)

## 2014-08-11 NOTE — Consult Note (Signed)
PATIENT NAME:  [name], [name] MR#:  [id number] DATE OF BIRTH:  19-Jun-1931  DATE OF CONSULTATION:  04/24/2014  REFERRING PHYSICIAN:   CONSULTING PHYSICIAN:  Cheral Marker. Ola Spurr, MD  REFERRING PHYSICIAN: Theodoro Grist, MD   REASON FOR CONSULT: Fungemia.   HISTORY OF PRESENT ILLNESS: This is a very pleasant 81 year old gentleman with history of atrial fibrillation, COPD, sleep apnea, diabetes, and obesity. He has had issues with chronic urinary tract infections. He underwent circumcision approximately 5 days prior to admission. This was apparently uncomplicated, however, he has developed fevers. Apparently, he had been also having fevers prior to this surgery. The patient was admitted January 9th, when he presented with fevers, chills, chest pain and shortness of breath. He was also found to be tachycardic and had a leukocytosis. Since admission, the patient has been started on broad-spectrum antibiotics. Blood cultures have turned positive for yeast. He has been started on micafungin.   Currently, he feels improved. He has a Foley catheter in. He has not had fevers or chills.   PAST MEDICAL HISTORY:  1. COPD. 2. Atrial fibrillation.  3. Sleep apnea. 4. Diabetes.  5. Obesity. 6. Recurrent urinary tract infections. 7. Prior CVA.  8. Hyperlipidemia. 9. Coronary artery disease. 10. BPH.   PAST SURGICAL HISTORY: Recent circumcision, coronary artery disease with stent placement x2.   SOCIAL HISTORY: The patient does not smoke or drink.   FAMILY HISTORY: Noncontributory.   ALLERGIES: HE IS ALLERGIC TO CONTRAST DYE, NIACIN AND MORPHINE.   REVIEW OF SYSTEMS: 11 systems were reviewed and negative except as per HPI.   ANTIBIOTICS SINCE ADMISSION: Include initially vancomycin and Zosyn. He is now on micafungin. He received a dose of fluconazole.   PHYSICAL EXAMINATION:  VITAL SIGNS: Temperature is 97.4, pulse 77, blood pressure 111/69, respirations 20, saturation 97% on 1 liter.  GENERAL:  He is morbidly obese, sitting in a chair, in no acute distress. He is on oxygen.  HEENT: Pupils are reactive.  OROPHARYNX: Clear with no thrush.  NECK: Supple.  HEART: Regular.  LUNGS: Clear.  ABDOMEN: Obese, soft, nontender. He has a Foley catheter in place.  NEUROLOGIC: He is alert and oriented x3.  SKIN: He has multiple bruising, but no obvious rash or skin lesions.   LABORATORY DATA: White blood count on admission was 19.0, it came down to 10.9 on the 10th, hemoglobin 13.9, platelets 213,000. Renal function, currently has a creatinine of 1.87. LFTs are normal except albumin low at 1.8.   Blood cultures from January 9th, 2/2 are growing yeast yet to be identified. Urine culture January 9th is growing yeast greater than 100,000. Wound culture January 9th is also growing enterococcus  and yeast. The enterococcus is sensitive to ampicillin.   Imaging: CT of the abdomen and pelvis with contrast done on the 12th shows cholelithiasis, wedge-shaped low density in the spleen concerning for infarction, bilateral nephrolithiasis. There is moderate left hydroureteronephrosis with a calculus in the distal left ureter. There are 2 pulmonary nodules measuring approximately 8 mm, no change from prior exam. Chest x-ray done on the 12th shows cardiomegaly with mild to moderate interstitial pulmonary edema. Ultrasound of the lower extremity was negative for DVTs bilaterally.   IMPRESSION: An 81 year old gentleman with recurrent urinary tract infection status post recent circumcision, admitted with sepsis, leukocytosis, fevers. He was found to have fungemia, as well as funguria. Clinically, he is improving. He is on micafungin.   RECOMMENDATIONS:  1. Would continue micafungin until further identification of the organism. Hopefully,  this will be a sensitive species and it can be treated with oral fluconazole. He does not have any central lines or prosthetic devices in place. He has had an echocardiogram which,  while a challenging study, did not show any evidence of large vegetations.  2. The duration of treatment should be approximately 2 weeks. Hopefully, this will be with oral fluconazole at 400 mg orally daily.  3. Thank you for the consult. I will be glad to follow with you.    ____________________________ Cheral Marker. Ola Spurr, MD dpf:kl D: 04/23/2014 22:09:46 ET T: 04/23/2014 23:15:18 ET JOB#: [id number]  cc: Cheral Marker. Ola Spurr, MD, <Dictator> Marcela Alatorre Ola Spurr MD ELECTRONICALLY SIGNED 04/24/2014 15:39

## 2014-08-11 NOTE — Consult Note (Signed)
PATIENT NAME:  [name], [name] MR#:  [id number] DATE OF BIRTH:  1931/05/30  DATE OF CONSULTATION:  06/04/2014  REQUESTING PHYSICIAN:  Epifanio Lesches, MD  CONSULTING PHYSICIAN:  Cheral Marker. Ola Spurr, MD  INFECTIOUS DISEASE CONSULTATION    REASON FOR CONSULTATION: Sepsis and urinary tract infection.   HISTORY OF PRESENT ILLNESS: This is a pleasant 81 year old gentleman who was admitted the beginning of January with a urinary tract infection and candidemia. He was discharged on voriconazole and clinically improved. In the meantime, he also suffered a stroke on January 22 and was admitted. He was discharged home again and was doing relatively well until he developed some symptoms of dark urine and dysuria. He was seen in an outpatient clinic and he reports having a urine sample done and starting on levofloxacin; this was about 5 days prior to admission. However, he continued to have dark urine and some pelvic pain. In the ER, he had an elevated white count of 17,000 and a positive urinalysis. The patient was started on broad spectrum antibiotics. We are consulted for further evaluation. Of note, the patient had stenting done January 13 for obstructing ureteral stone on the left.   Since admission, the patient reports feeling somewhat better.   PAST MEDICAL HISTORY:  1.  Recent CVA.  2.  COPD.  3.  Atrial fibrillation on Coumadin.  4.  Hyperlipidemia.  5.  Hypertension.  6.  Diabetes.  7.  Coronary artery disease.  8.  BPH.   9.  Sleep apnea.  10.  Urethral obstruction from nephrolithiasis status post stenting.   PAST SURGICAL HISTORY:  1.  Left urethral stent January 2016.  2.  Cardiac stent placement.   SOCIAL HISTORY: Lives with his wife, uses a walker. Former smoker. No alcohol use.   FAMILY HISTORY: Noncontributory.   ALLERGIES: IV CONTRAST, MORPHINE, AND NIACIN.   ANTIBIOTICS SINCE ADMISSION: Include ceftriaxone, fluconazole.    REVIEW OF SYSTEMS: Eleven systems reviewed  and negative except as per HPI.   PHYSICAL EXAMINATION:  VITAL SIGNS: Temperature 97.8, pulse 85, blood pressure 108/65, respirations 20, saturation 96% on 2 L.  GENERAL: He is chronically ill-appearing, somewhat disheveled.  EYES: Pupils reactive. Sclerae anicteric.  OROPHARYNX: Clear.  NECK: Supple.  HEART: Regular.  LUNGS: Clear.  ABDOMEN: Soft, nontender, nondistended. He has some mild pain in his left lower quadrant.  EXTREMITIES: Edema 1+ bilaterally.  NEUROLOGIC: He is alert and oriented x 3. Grossly nonfocal neurologic exam.   DATA: CT of the abdomen and pelvis, February 22, shows placement of a left urethral stent but continued presence of large nonobstructive calculus in the lower pole collecting system of the left kidney. There is a nonobstructive 5 mm calculus in the left renal pelvis. There is cholelithiasis. There is mild wall thickening and surrounding inflammation in the urinary bladder. There is an 8.5 mm nodule in the left lower lobe of the lung. White blood count February 22 of 17,000, February 23 of 11.3; hemoglobin 11.7; platelets 218,000. Blood cultures 2/3 no growth to date. Urine culture colonies too small to read. Urinalysis had 6000 white cells, white blood cell clumps and budding yeast were seen. Renal function shows a creatinine of 1.46, BUN of 24. LFTs are normal except albumin low at 2.4. Prior culture results from January 2016 revealed blood cultures positive for Candida glabrata. Urine culture from January 09 is also positive for Candida glabrata. Followup urine culture January 21 grew Klebsiella.   IMPRESSION: An 81 year old gentleman with a recent history  of placement of a left ureteral stent for an obstructing renal stone who also had in January of fungemia from a urinary source with Candida glabrata. He finished treatment with antibiotics and antifungals; however, still has a stent and stone in place. Clinically, he is readmitted with urosepsis and an elevated  white count.   RECOMMENDATIONS:  1.  Continue ceftriaxone.  2.  Change his fluconazole for micafungin, which would provide coverage for Candida glabrata.  3.  Await blood and urine cultures and can tailor antibiotics to that organism isolated.  4.  Will likely need a prolonged course of antibiotics to ensure he remains infection-free in order to have stent removal and management of his stones.   Thank you for the consult. I will be glad to follow with you.    ____________________________ Cheral Marker. Ola Spurr, MD dpf:bm D: 06/04/2014 21:27:27 ET T: 06/05/2014 01:04:30 ET JOB#: [id number]  cc: Cheral Marker. Ola Spurr, MD, <Dictator> Kelcee Bjorn Ola Spurr MD ELECTRONICALLY SIGNED 06/13/2014 19:53

## 2014-08-11 NOTE — Discharge Summary (Signed)
PATIENT NAME:  [name], [name] MR#:  [id number] DATE OF BIRTH:  January 18, 1932  DATE OF ADMISSION:  06/03/2014 DATE OF DISCHARGE:  06/07/2014  DISCHARGE DIAGNOSES:  1. Urinary tract infection with Candida glabrata. 2. History of cerebrovascular accident.  3. History of chronic obstructive pulmonary disease.  4. Diabetes mellitus type 2. 5. Sleep apnea.  6. Depression. 7. Left ureteral stone with stent placement, needs ureteral stone extractions soon to prevent recurrent urinary tract infections..  8. Benign prostatic hypertrophy.  9. Acute renal failure secondary to dehydration and p.o. intake. This acute renal failure improved. 10. Pulmonary nodule. Needs repeat CAT scan of the chest with Dr. Raul Del in 3 months.   DISCHARGE MEDICATIONS:  1. Albuterol 90 mcg 2 puffs every 6 hours. 2. Lumigan 0.03% 1 drop in affected eye once a day at bedtime.  3. Nitroglycerin 0.4 mg sublingual every 5 minutes as needed for chest pain.  4. Xanax 0.25 mg 1 tablet once a day at bedtime as needed for anxiety.  5. Atorvastatin 40 mg  p.o. daily.  6. Symbicort 160/4.5 two puffs b.i.d.  7. Celexa 10 mg p.o. daily.  8. Finasteride 5 mg p.o. daily.  9. Furosemide 40 mg 1/2-1 tablet p.o. daily.  10. Levemir 24 units at bedtime.  11. Isosorbide dinitrate 1/2 tablet p.o. b.i.d.  12. Lisinopril 10 mg p.o. daily.  13. Metoprolol succinate 50 mg p.o. b.i.d.  14. Eliquis 5 mg p.o. b.i.d. 15. Potassium chloride 10 mEq p.o. t.i.d.  16. Prednisone 10 mg 1/2 tablet daily.  17. Tamsulosin 0.4 mg p.o. daily.  18. Januvia 100 mg p.o. daily.  19. Spiriva 18 mcg inhalation daily.  20. Albuterol nebulizer 3 mL every 6 hours as needed.  21. Homatropine with hydrocodone 1.5/5 mg oral syrup every 8 hours as needed for cough.  22. Tylenol 500 mg p.o. q. 8 hours p.r.n. for pain.  23. Voriconazole 200 mg q. 12 hours for 14 days   Voriconazole is a new medication. The patient advised to stop Levaquin.   Discharged home with  home physical therapy.   DIET: Low-sodium, low-fat, ADA diet.   CONSULTATIONS:  ID consult with Dr. Ola Spurr; also, consultation with Dr. Hollice Espy.   HOSPITAL COURSE:  1. The patient is an 81 year old male patient with history of chronic obstructive pulmonary disease, cerebral vascular accident, diabetes, atrial fibrillation, status post ureteral stent placement on January 13 because of a 12 mm ureteral stone and candidemia recurrent urinary tract infections, comes in because of dysuria, cloudy urine, chills, and malaise. The patient's WBC elevated at 70 on admission and urinalysis was positive for urinary tract infection. The patient admitted for urinary tract infections due to yeast and started on, initially, Diflucan. The patient also was given intravenous fluids because of dehydration. The patient was seen by Dr. Ola Spurr, and the patient had yeast and previously he had infection with Candida glabrata, so he changed the antibiotics from fluconazole to micafungin. He was started on micafungin IV by Dr. Ola Spurr. The patient admitted to hospitalist service for urinary tract infection and also generalized weakness. The patient was seen by Dr. Ola Spurr and Dr. Erlene Quan, as well. The patient had a left ureteral stent  placement on January 13 because of the ureteral stone, but the stone was never removed because of multiple comorbidities. The patient saw Dr. Erlene Quan in the office for the symptoms, and the patient was given Levaquin for urinary tract infection.  Because of continued symptoms, he came to the Emergency Room, found  to have ( urinary tract infection. with yeast/bacteria. The patient's urine culture showed yeast, but no other bacteria and his blood cultures were negative, so Dr. Ola Spurr recommended that he had Adline Potter before, so we should treat for that, as well, so he was started on voriconazole. He suggested that this time, he needs extended period of course, so we gave him  for 2 weeks' worth of prescriptions about the prescriptions. The patient was advised to see Dr. Erlene Quan in about a week regarding further treatment for his stone and removing the stone as a definitive treatment to prevent recurrent urinary tract infections. The patient also was given ceftriaxone to cover bacteria in this hospitalization, but that was stopped and not given any oral antibacterial because of his urine cultures showing only yeast, but no bacteria.  2. Acute renal failure on chronic renal failure. The patient's BUN was 26, creatinine 1.50 on admission. The patient was given IV fluids and his creatinine is around baseline was fluctuating between 1.5-1.4. Anyway, because of his symptoms of tiredness, fatigue, decreased p.o. intake, we gave him fluids. The patient felt much better with IV medication, micafungin, Rocephin, and fluids. At the time of discharge, he was completely asymptomatic. I spoke with the wife in detail that he needs to have a stone removed with Dr. Erlene Quan if possible. The patient was advised to continue voriconazole.  3. Diabetes mellitus type 2. Sugars have been well controlled, so he is continued on his Levemir and sliding scale insulin, as well. He is on Levemir 24 units at bedtime.  4. History of chronic obstructive pulmonary disease, sleep apnea, and lung nodule on the CAT scan of the chest, so the patient is already following with Dr. Raul Del. He has a CPAP at night. He uses inhalers and a small dose of daily prednisone at 5 mg daily, and he also has Spiriva. We advised the patient to continue on that and see Dr. Raul Del in 3 months regarding the CT chest follow-up.  5. History of cerebral vascular accident and chronic atrial fibrillation. The patient does not have any neurological weakness at this time and he is on Eliquis 5 mg p.o. b.i.d. and also metoprolol 50 mg p.o. b.i.d. His rate is very well controlled and he is  anticoagulated with Eliquis 5 mg b.i.d., so we advised  him to continue them.  6. Benign prostatic hypertrophy. He is on finasteride 5 mg daily. 7. Deconditioning. Seen by physical therapy. They recommended home physical therapy.   LABORATORY DATA: Electrolytes on admission: Sodium 137, potassium 4.2, chloride 104, bicarbonate 24, BUN 20, creatinine 1.5, glucose 313. Urine culture showed Candida glabrata from February 22. The colonies are more than 100,000 for Candida glabrata. White count 17.1, hemoglobin 13.7, hematocrit 41.3, platelets 276 on admission. The patient's urinalysis showed yellow turbid urine with 3+ bacteria and a 1600 RBCs, and WBCs 6000. Blood cultures have been negative. The patient's white count decreased to 11.7 on February 23. The patient has been afebrile. The patient also had a CAT scan of the abdomen and pelvis, which showed no hydronephrosis. There is a nonobstructing 5 mm calculus in the left renal pelvis. The patient's left ureteral stent is in good position. No other problems. The patient was found to have about an 8.5 mm nodule in the left lower lobe of the lung, so advised to follow up with a chest CAT scan to evaluate for follow-up of the lung nodule. Chest x-ray showed no active cardiopulmonary abnormality.   DISCHARGE VITAL  SIGNS: Temperature 96.3, heart rate 68, blood pressure 125/80, saturations 94% on room air.     PHYSICAL EXAMINATION AT THE TIME OF DISCHARGE:  CARDIOVASCULAR: S1, S2 irregularly irregular.  LUNGS: Clear to auscultation.  ABDOMEN: Soft, nontender, nondistended. Bowel sounds present.   TIME SPENT ON DISCHARGING THE PATIENT: More than 30 minutes.   Discussed the whole plan with the patient's wife.   ____________________________ Epifanio Lesches, MD sk:mw D: 06/08/2014 08:35:00 ET T: 06/08/2014 15:07:32 ET JOB#: [id number]  cc: Sherlynn Stalls, MD Cheral Marker. Ola Spurr, MD Epifanio Lesches, MD, <Dictator>     Epifanio Lesches MD ELECTRONICALLY SIGNED 06/17/2014 15:22

## 2014-08-11 NOTE — Op Note (Signed)
PATIENT NAME:  [name], [name] MR#:  [id number] DATE OF BIRTH:  [birth year]  DATE OF PROCEDURE:  07/15/2014  PREOPERATIVE DIAGNOSIS: Left ureteral stone, left kidney stones.   POSTOPERATIVE DIAGNOSIS: Left ureteral stone, left kidney stones.   PROCEDURE PERFORMED: Cystoscopy, left ureteral stent exchange.   ATTENDING SURGEON: Sherlynn Stalls, MD   ANESTHESIA: General anesthesia.   ESTIMATED BLOOD LOSS: Minimal.   DRAINS: A 6 x 26-French double-J ureteral stent on the left (Bard Optima stent).   SPECIMENS: None.   COMPLICATIONS: None.   INDICATION: This is an 81 year old male with many medical comorbidities and recent episodes of sepsis and stroke. He underwent emergent left ureteral stent placement for an obstructing stone back in January 2015 but has been too unwell to tolerate any procedures up until this time. He continues to grow yeast intermittently in his urine. Given his current medical status and overall health, I have counseled him to undergo a stent exchange today in lieu of treatment of his stones given my concern for sepsis and further decompensation. He is agreeable with this plan. Risks and benefits of the procedure were explained in detail. The patient agreed to proceed as planned.   PROCEDURE: The patient was correctly identified in the preoperative holding area and informed consent was confirmed. He was brought to the operating suite and placed on the table in the supine position. At this time, universal timeout protocol was performed. All team members were identified, Venodyne boots were placed, and he was administered IV ampicillin, gentamicin, and fluconazole in the perioperative period. He was then placed under general anesthesia, repositioned lower on the bed in the dorsal lithotomy position, and prepped and draped in the standard surgical fashion. A rigid 22-French cystoscope was advanced per urethra into the bladder. Inspection of the bladder revealed very  cloudy-appearing urine. The cloudy urine was drained. This had the consistency of milk with small debris. The left ureteral distal coil of the stent was identified, which was covered in a white fibrinous-type material consistent with, presumably, yeast biofilm. The distal coil of the stent was grasped using stent graspers and brought out to the level of the urethral meatus. The stent was then cannulated using a 0.038 Sensor wire up to the level of the kidney. The old stent was removed, leaving only the wire in place. The wire was then backloaded over the rigid cystoscope and a 6 x 26-French double-J ureteral stent, Bard Optima, was advanced over the wire up to the level of the renal pelvis. The wire was then partially withdrawn. A coil was noted within the renal pelvis. The wire was then fully withdrawn and a coil was noted within the bladder. Of note, the stent was mildly redundant and would consider a shorter stent in future cases. The bladder was then drained again. The patient was then repositioned in the supine position, reversed from anesthesia, and taken to the PACU in stable condition. There were no complications in this case.   ____________________________ Sherlynn Stalls, MD ajb:ST D: 07/15/2014 17:10:51 ET T: 07/15/2014 21:47:49 ET JOB#: [id number]  cc: Sherlynn Stalls, MD, <Dictator> Sherlynn Stalls MD ELECTRONICALLY SIGNED 07/23/2014 18:14

## 2014-08-11 NOTE — Consult Note (Signed)
PATIENT NAME:  [name], [name] MR#:  [id number] DATE OF BIRTH:  Oct 10, 1931  DATE OF CONSULTATION:  04/21/2014  REFERRING PHYSICIAN:   CONSULTING PHYSICIAN:  Denice Bors. Jaques Mineer, MD  HISTORY OF PRESENT ILLNESS: The patient is an 81 year old male with past medical history of permanent atrial fibrillation, coronary artery disease, COPD, diabetes mellitus admitted with sepsis for evaluation of atrial fibrillation. The patient's last echocardiogram was performed in October 2015. At that time he had normal LV function. There was moderate left atrial enlargement, mild right ventricular enlargement, and mild right atrial enlargement. There was mild to moderate tricuspid regurgitation with moderate pulmonary hypertension. The patient does have a history of coronary artery disease. He is status post myocardial infarction treated with tPA at [facility] in 1997. He had a nuclear study in 2007 that showed an ejection fraction of 50% without scar and mild peri-infarct ischemia. He has been adamant in the past that he would never be agreeable to repeat catheterization.   The patient has also had a previous stroke. The patient has had difficulties with recurrent urinary tract infections. Apparently he developed fever, approximately 5 to 7 days ago, which preceded a circumcision. He was admitted yesterday with generalized maladies, fevers, and dyspnea. He denies any chest pain. He has been noted to have atrial fibrillation with an elevated gradient. Cardiology was asked to evaluate.   PAST MEDICAL HISTORY: Significant for hypertension and hyperlipidemia. He also has diabetes mellitus. He has a history of coronary artery disease as outlined in the HPI. He has had a prior stroke. He also has a history of nephrolithiasis and neuropathy.   PAST SURGICAL HISTORY: He has a history of tonsillectomy, carotid stent insertion, kidney surgery with prior stent placement, and bladder surgery.   FAMILY HISTORY: Significant for  heart disease in his mother.   SOCIAL HISTORY: He is a former smoker. He does not consume alcohol.   REVIEW OF SYSTEMS: He denies any headaches. He has had fevers. There is no productive cough or hemoptysis. There is no dysphagia, odynophagia, melena, or hematochezia. He does occasionally have problems urinating. He does not have orthopnea or PND, but he has chronic pedal edema. He also has chronic dyspnea on exertion. The remaining systems are negative.   PHYSICAL EXAM:  VITAL SIGNS: Temperature 98.9 with a T-max 101. His pulse is 103. His blood pressure at present is 149/103. He is 97% on 3 liters.  GENERAL: He is well developed and chronically ill appearing. He is in no acute distress.  SKIN: Warm and dry.  PSYCHIATRIC: He does not appear to be depressed. There is no peripheral clubbing.  BACK: Normal.  HEENT: Normal with normal eyelids.  NECK: Supple with a normal upstroke bilaterally and I cannot appreciate bruits. There is no thyromegaly noted.  CHEST: Clear to auscultation.  CARDIOVASCULAR: Reveals an irregular rhythm and a tachycardic rate. There were no murmurs noted.  ABDOMEN: Mildly distended but no hepatosplenomegaly. No masses appreciated. There is no abdominal bruit. He has 2+ femoral pulses bilaterally.  EXTREMITIES: Show 2+ edema but no cords are palpated. His distal pulses are diminished.  NEUROLOGIC: Grossly intact.   His laboratory studies show a sodium of 137 with potassium 3.7. His BUN is 25 with creatinine of 1.46. His troponins have been normal. White blood cell count initially was 19, but has improved to 10.9 today. Hemoglobin is 13.9 with hematocrit of 43.3. His platelet count is 213,000. His INR is 1.6.   Echocardiogram shows normal LV function. The  left atrium is severely dilated and the aortic root is mildly dilated. There is mild mitral regurgitation and tricuspid regurgitation.   DIAGNOSES:  1.  Permanent atrial fibrillation: The patient has elevated heart rate  at present. This is most likely being driven by his urosepsis. We would recommend continuing metoprolol at 25 mg p.o. q.6h. Would convert back to Toprol after his infection improves. His rate being elevated at present, we will add low-dose Cardizem 30 mg p.o. every 6 hours. We will follow his heart rate and an adjust his regimen as indicated. He is on Coumadin at home. His INR is subtherapeutic and he has a history of stroke. I agree with treating with Lovenox until his INR is therapeutic. Echocardiogram shows preserved LV function.  2.  Urosepsis: I agree with continuing antibiotics and this is being managed by primary care.  3.  Acute-on-chronic diastolic congestive heart failure: The patient is volume overloaded on examination. I would recommend discontinuing his IV fluids and following his blood pressure closely. As he improves from his urosepsis, I would recommend increasing his diuretic as he is significantly volume overloaded.  4.  Coronary artery disease: Given that he requires Coumadin long term, I would discontinue his aspirin. Continue statin at discharge.  5.  Renal insufficiency: Follow renal function closely, particularly with diuresis.   We will follow while the patient is in the hospital.     ____________________________ Denice Bors. Stanford Breed, MD bsc:ah D: 04/21/2014 14:51:28 ET T: 04/21/2014 15:01:55 ET JOB#: [id number]  cc: Denice Bors. Stanford Breed, MD, <Dictator> Lelon Perla MD ELECTRONICALLY SIGNED 04/21/2014 16:35

## 2014-08-11 NOTE — H&P (Signed)
PATIENT NAME:  [name], DENNIN MR#:  [id number] DATE OF BIRTH:  1931-07-15  DATE OF ADMISSION:  05/03/2014  REFERRING DOCTOR: Wells Guiles L. Lord, MD     PRIMARY CARE PRACTITIONER:  [facility].   ADMITTING DOCTOR: Juluis Mire, MD   CHIEF COMPLAINT:  1.  Transient confusion with visual disturbances.   2.  Right-sided weakness.    HISTORY OF PRESENT ILLNESS: Mr. Holzman is an 81 year old Caucasian male recently discharged from [facility] following treatment for urosepsis and discharged to a rehab facility to undergo rehab presents with the complaints of transient confusion associated with visual disturbances that happened around suppertime today. The patient states that he was doing perfectly well and undergoing rehab at skilled facility following his recent discharge from the hospital, and while having his supper he noticed confusion with associated visual disturbances. Hence was brought to the Emergency Room for further evaluation. The patient also noticed some weakness of the right upper and lower extremity at the same time and which gradually improved as the time went by.   In the Emergency Room, the patient was evaluated by the ED physician and was found to have elevated blood sugars of 447 but no ketoacidosis and the patient's mental confusion resolved completely. He still has some visual disturbances but denies any focal weakness or numbness at this time. Denies any chest pain, shortness of breath. No nausea, no vomiting. No diarrhea. No fever. No cough. The patient was recently admitted and treated for urosepsis and is still using antibiotics. Workup in the Emergency Room also revealed urosepsis and for which he was started on IV ceftriaxone after the urine cultures were sent. The patient is comfortably lying in the bed at this time and the patient's wife and his son not at his bedside at this time.    PAST MEDICAL HISTORY:  1.  Chronic obstructive pulmonary disease.  2.   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  3.  Atrial fibrillation, on Coumadin.  4.  Hyperlipidemia.  5.  Hypertension.  6.  Diabetes mellitus type 2.  7.  Coronary artery disease, status post stent.  8.  Benign prostatic hypertrophy.  9.  Sleep apnea.  10.  Recent urosepsis with admission to [facility], status post discharge on 04/30/2014.   PAST SURGICAL HISTORY:  1.  Status post ureteral stent done during recent admission.  2.  Status post cardiac stent placement.    ALLERGIES:  1.  IV DRUG CONTRAST.   2.  IRON.  3.  NIACIN.  4.  MORPHINE.   SOCIAL HISTORY: He is married, lives with his wife and currently in rehab following his recent [facility] discharge. Denies any alcohol, substance abuse. No history is smoking.   FAMILY HISTORY: Positive for diabetes, coronary artery, and lung cancer.    HOME MEDICATIONS:  1.  Acetaminophen/hydrocodone 325/5 mg tablet, 1 tablet orally every 4 hours as needed for pain.  2.  Albuterol 90 mcg inhalation every 6 hours as needed.  3.  Alprazolam 0.25 mg 1 tablet at bedtime as needed for anxiety.  4.  Atorvastatin 40 mg tablet, 1 tablet orally once a day.  5.  Budesonide/formoterol 160/4.5 mcg, 2 puffs 2 times a day.  6.  Celexa 10 mg tablet, 1 tablet orally once a day.  7.  Diltiazem 120 mg tablet orally once a day.  8.  Finasteride 5 mg tablet, 1 tablet orally once a day.  9.  Fish oil  capsules, 1 capsule orally 3 times a day.  10.  Furosemide 40 mg tablet, 1 tablet orally once a day.  11.  Insulin Detemir 15 units subcutaneously once at bedtime.  12.  Isosorbide dinitrate 40 mg tablet, 1 tablet orally 3 times a day.  13.  Lisinopril 10 mg tablet, 1 tablet orally once a day.  14.  Lumigan eyedrops 0.03%, one drop in both eyes once a day at bedtime.  15.  Metoprolol 12.5 mg tablet, 1 tablet orally every 12 hours.  16.  Nitroglycerin sublingual tablet, 1 tablet as needed for chest pain.  17.  Potassium chloride  10 mEq oral tablet, 3 tablets orally once a day.  18.  Prednisone 10 mg tablet orally, 1 tablet orally once a day.  19.  Simethicone 80 mg tablet orally chewable, 1 tablet 4 times a day before meals and at bedtime.  20.  Tamsulosin 0.4 mg oral capsule, 1 capsule orally once a day.  21 Warfarin 1 mg oral tablet, 4 tablets orally at bedtime.   REVIEW OF SYSTEMS:  CONSTITUTIONAL: Negative for fever, chills. He does have some generalized weakness but undergoing rehab at skilled nursing facility following his recent discharge from the [facility].  EYES: Positive for blurred vision in both eyes which started around suppertime. No pain. No redness. No discharge.  EARS, NOSE, AND THROAT: Negative for tinnitus, ear pain, hearing loss. No epistaxis. No nasal discharge. No difficulty swallowing.  RESPIRATORY: Negative for cough, wheezing, dyspnea, hemoptysis, painful respiration.  CARDIOVASCULAR: Negative for chest pain, palpitations, dizziness, syncopal episodes, orthopnea, dyspnea on exertion, or pedal edema.  GASTROINTESTINAL: Negative for nausea, vomiting, diarrhea, abdominal pain, hematemesis, melena, rectal bleeding, or GERD  symptoms.  GENITOURINARY: Positive for some frequency of urination. No hematuria. No dysuria.  ENDOCRINE: Negative for polyuria, nocturia, heat or cold intolerance.  HEMATOLOGIC AND LYMPHATIC: Negative for anemia, easy bruising, bleeding.  INTEGUMENTARY: Negative for acne, skin rash, or lesions.  MUSCULOSKELETAL: Negative for arthritis, gout. No neck or shoulder pain.  NEUROLOGICAL: Positive for visual disturbances as noted in the history of present illness. Positive for transient confusion as noted in the history of present illness and positive for weakness of the right upper and lower extremity, which is transient and resolved currently. History of CVA with left hemiparesis in the past with near complete recovery.  PSYCHIATRIC: Negative for anxiety, insomnia. Positive for depression  and takes medication and under control.   PHYSICAL EXAMINATION:  VITAL SIGNS: Temperature 98.2 degrees Fahrenheit, pulse rate 78 per minute,  respirations 20 per minute,  blood pressure 122/75, O2 saturations 96% on room air.  GENERAL: Well developed, well nourished. Alert and oriented. In no  acute distress. Comfortably resting in the bed.   HEAD: Atraumatic, normocephalic.  EYES: Pupils equal, react to light and accommodation. No conjunctival pallor. No icterus. Extraocular movements intact.  NOSE: No drainage. No lesions.  EARS: No drainage. No external lesions.  ORAL CAVITY: No mucosal lesions. No exudates.  NECK: Supple. No JVD. No thyromegaly. No carotid bruit. Range of motion of neck within normal limits.  RESPIRATORY: Good respiratory effort. Not using accessory muscles of respiration. Bilateral vesicular breath sounds present. No rales or rhonchi.  CARDIOVASCULAR: S1, S2 irregular. Peripheral pulses equal at carotid, femoral, and pedal pulses, no peripheral edema.  GASTROINTESTINAL: Abdomen soft, obese, nontender. No hepatosplenomegaly. No masses. No rigidity. No guarding. Bowel sounds present and equal in all 4 quadrants.  GENITOURINARY: Deferred.  MUSCULOSKELETAL: No joint tenderness or effusion. Range of motion  adequate. Strength and tone equal bilaterally.  SKIN: Inspection within normal limits. No obvious wounds.  LYMPHATICS: No cervical lymphadenopathy.  VASCULAR: Good dorsalis pedis and posterior tibial pulses.  NEUROLOGICAL: Alert, awake, and oriented x 3. Cranial nerves II through XII grossly intact. No facial asymmetry. No sensory deficit. Motor strength 5/5 in both upper and lower extremities. DTRs 2+ bilateral and symmetrical. Plantars downgoing.  PSYCHIATRIC: Alert, awake, and oriented x 3. Judgment, insight adequate. Memory and mood within normal limits.   ANCILLARY DATA:  LABS:  Serum glucose 447, BUN 26, creatinine 1.76, sodium 133, potassium 4.3, chloride 97,  bicarbonate 27, osmolality 290, total calcium 8.5, total protein 7.0, albumin 2.4, bilirubin 0.3, alkaline phosphatase 111, AST 6, ALT 22, total CK 17, troponin less than 0.02, WBC 12.4, hemoglobin 12.1, hematocrit 36.9, platelet 453, prothrombin time 28.3, INR 2.7. Urinalysis: Clear, leukocyte esterase 2+, WBCs 60 per high-powered field, nitrite negative.  IMAGING STUDIES:  X-ray chest: No acute cardiopulmonary disease.   CT of the head, noncontrast study: No acute finding, atrophy and chronic microvascular ischemic changes.   EKG: Atrial fibrillation with ventricular rate of 69 beats per minute, no acute ST, T changes.    ASSESSMENT AND PLAN: An 81 year old Caucasian male with a past medical history of chronic atrial fibrillation, on chronic anticoagulation, history of cerebrovascular accident with left hemiparesis, hypertension, coronary artery disease, status post stent, diabetes mellitus type 2, on insulin, chronic obstructive pulmonary disease, sleep apnea, chronic kidney disease, stage II, hyperlipidemia, benign prostatic hypertrophy, recent [facility] admission with urosepsis, status post discharge to rehab on 04/30/2014 presents with transient confusion with associated visual disturbances and right-sided weakness concerning for transient ischemic attack versus cerebrovascular accident.   1.  Transient episode of confusion with associated visual disturbances and right-sided weakness concerning for transient ischemic attack versus cerebrovascular accident. Right-sided weakness and confusion resolved, visual disturbances still present. CT of the head negative for any acute intracranial pathology. Plan: Admit to telemetry. Neuro watch. Continue aspirin, statin, and Coumadin. We will obtain neuro consult. Order MRI, carotid Doppler and echocardiogram.  2.  Diabetes mellitus, on insulin, elevated blood sugar. No acidosis. Plan: The patient received short-acting insulin. Blood sugar  improving. Continue insulin. Monitor blood sugars closely.  3.  Urinary tract infection. Recent [facility] admission, status post discharge on 04/30/2014. Urinalysis now with 60+ WBC. Plan: Urine culture, sensitivity, start Rocephin.  4.  History of cerebrovascular accident with left hemiparesis in the past, now with right-sided weakness and visual disturbances. Plan: As mentioned above.    5.  Chronic atrial fibrillation with controlled ventricular rate. The patient on chronic anticoagulation, INR therapeutic, continue same.  6.  History of coronary artery disease, status post stents, stable on medical management. No acute problems, continue home medications.  7.  Hypertension, controlled on home medication. Continue same.  8.  Chronic obstructive pulmonary disease, stable on home medications. Continue same.  9.  History of benign prostatic hypertrophy, stable on home medications. Continue same.  10.  Sleep apnea, on continuous positive airway pressure. Continue same.  11.  Deep vein thrombosis prophylaxis. The patient on Coumadin. Continue same.  12.  Gastrointestinal prophylaxis, proton pump inhibitor.   CODE STATUS: Full code.   TIME SPENT: 55 minutes.    ____________________________ Juluis Mire, MD enr:AT D: 05/03/2014 01:30:57 ET T: 05/03/2014 02:21:56 ET JOB#: [id number]  cc:     Ulice Brilliant Manville Rico MD ELECTRONICALLY SIGNED 05/04/2014 20:54

## 2014-08-11 NOTE — H&P (Signed)
PATIENT NAME:  [name], [name] MR#:  [id number] DATE OF BIRTH:  03/15/1932  DATE OF ADMISSION:  04/20/2014  REFERRING PHYSICIAN: [email or web address].   FAMILY PHYSICIAN: [facility].   REASON FOR ADMISSION: Presumed urosepsis.   HISTORY OF PRESENT ILLNESS: The patient is an 81 year old male with a significant history of chronic atrial fibrillation on Coumadin, COPD, sleep apnea, diabetes and obesity. Underwent circumcision approximately 5 days ago by [facility]. Apparently, the patient was having fevers prior to his circumcision, but proceeded with procedure as scheduled. Presents now with fever and chills. Also complaining of chest pain and shortness of breath. It is noted that the patient was off Coumadin for his procedure and his INR is currently subtherapeutic. He is complaining of chest pain and shortness of breath. Peripheral edema noted. In the emergency room, the patient was noted to be tachycardic with low-grade fever. Also had a marked leukocytosis, consistent with possible sepsis. He is now admitted for further evaluation.   PAST MEDICAL HISTORY: 1. Status post recent circumcision.  2. Previous stroke.  3. Chronic atrial fibrillation.  4. Hyperlipidemia.  5. Obesity.  6. Benign hypertension.  7. Type 2 diabetes.  8. ASCVD status post PTCA with stent placement x 2.  9. BPH.   MEDICATIONS: 1. Coumadin 4 mg p.o. daily.  2. Flomax 0.4 mg p.o. daily.  3. Symbicort 160/4.5 two puffs b.i.d.  4. Spiriva 1 capsule inhaled daily.  5. Prednisone 10 mg p.o. daily.  6. Nasonex 2 puffs in each nostril daily.  7. Toprol-XL 50 mg p.o. b.i.d.  8. Metformin 1000 mg p.o. b.i.d.  9. Lumigan eyedrops as directed.  10. Lisinopril 40 mg p.o. daily.  11. Lasix 40 mg p.o. daily.  12. Klor-Con 30 mEq p.o. daily.  13. Isordil 40 mg p.o. 3 times a day.  14. Amaryl 4 mg p.o. every a.m. and 8 mg p.o. every p.m.  15. Proscar 5 mg p.o. daily.  16. Celexa 10 mg p.o. daily.  17. Aspirin  81 mg p.o. daily.   ALLERGIES: IV CONTRAST DYE, NIACIN, MORPHINE.   SOCIAL HISTORY: Negative for alcohol or tobacco abuse.   FAMILY HISTORY: Positive for diabetes, coronary artery disease and lung cancer.   REVIEW OF SYSTEMS:  CONSTITUTIONAL: The patient has had fever, but no change in weight.   EYES: No blurred or double vision. No glaucoma.   ENT: No tinnitus or hearing loss. No nasal discharge or bleeding. No difficulty swallowing.   RESPIRATORY: No cough or wheezing. Denies hemoptysis. No painful respiration.   CARDIOVASCULAR: The patient has had chest pain and orthopnea. No syncope. Has chronic palpitations.   GASTROINTESTINAL: Has had nausea but no vomiting or diarrhea. No abdominal pain or change in bowel habits.   GENITOURINARY: No dysuria or hematuria. No incontinence.   ENDOCRINE: No polyuria or polydipsia. No heat or cold intolerance.   HEMATOLOGIC: The patient denies anemia, easy bruising or bleeding.   LYMPHATIC: No swollen glands.   MUSCULOSKELETAL: The patient denies pain in his neck, back, shoulders, knees or hips. No gout.   NEUROLOGIC: No numbness or migraines. Denies seizures.   PSYCHIATRIC: The patient denies anxiety, insomnia or depression.   PHYSICAL EXAMINATION: GENERAL: The patient is acutely ill-appearing, in moderate distress.   VITAL SIGNS: Currently remarkable for a blood pressure of 150/84, heart rate 115, respiratory rate of 30, temperature 98.9, saturation 97% on 2 liters.   HEENT: Normocephalic, atraumatic. Pupils equally round and reactive to light and accommodation. Extraocular movements are  intact. Sclerae are anicteric. Conjunctivae are clear. Oropharynx is clear.   NECK: Supple without JVD. No adenopathy or thyromegaly is noted.   LUNGS: Reveal basilar rales bilaterally with no wheezes or rhonchi. No dullness. Respiratory effort is increased.   CARDIAC: Rapid rate with an irregularly irregular rhythm. No significant rubs or gallops.  PMI is nondisplaced. Chest wall is nontender.   ABDOMEN: Distended, but nontender. Normoactive bowel sounds. No organomegaly or masses were appreciated.   EXTREMITIES: Revealed 2+ edema with stasis changes. Pulses were 2+ bilaterally.   SKIN: Warm and dry without rash or lesions.   NEUROLOGIC: Cranial nerves 2 through 12 grossly intact. Deep tendon reflexes were symmetric. Motor and sensory examination is nonfocal.   PSYCHIATRIC: Revealed a patient who was alert and oriented to person, place and time. He was cooperative and used good judgment.   LABORATORY DATA: EKG revealed rapid atrial fibrillation with LVH. Chest x-ray revealed cardiomegaly with chronic interstitial lung disease. Abdominal films revealed nephrolithiasis and cholelithiasis with constipation. His protime was 16.4 with an INR of 1.3. His white count was 19.0 with a hemoglobin of 14.8. Glucose was 419 with a BUN of 29, creatinine 1.65 with a GFR 43. Sodium 133. Troponin was less than 0.02. Lipase was 421. Urinalysis revealed 825 WBCs per high-power field with 3+ leukocyte esterase.   ASSESSMENT: 1. Presumed sepsis.  2. Urinary tract infection.  3. Rapid atrial fibrillation.  4. Chest pain and shortness of breath, worrisome for pulmonary embolism.  5. Atherosclerotic cardiovascular disease status post percutaneous transluminal coronary angioplasty with stent placement.  6. Type 2 diabetes.  7. Renal insufficiency.  8. Hyponatremia.   PLAN: The patient will be admitted as a full code to the intensive care unit. He will be started on intravenous normal saline. He will be sent for a V/Q scan to rule out pulmonary embolus. He will be started on Lovenox as well at therapeutic levels. Blood and urine cultures have been sent. We will begin IV antibiotics. Will consult urology and cardiology. We will follow serial cardiac enzymes and obtain an echocardiogram. Continue maximum pulmonary toilet for now, including DuoNeb small volume  nebulizers. Follow up routine labs in the morning. NPO except ice chips and medications for now. Further treatment and evaluation will depend upon the patient's progress.   Total Time Spent ON this patient was 50 minutes.     ____________________________ Leonie Douglas Doy Hutching, MD jds:TT D: 04/20/2014 16:43:31 ET T: 04/20/2014 17:08:56 ET JOB#: [id number]  cc: Leonie Douglas. Doy Hutching, MD, <Dictator> Olmito and Olmito MD ELECTRONICALLY SIGNED 04/20/2014 20:46

## 2014-08-21 ENCOUNTER — Encounter: Payer: Self-pay | Admitting: Endocrinology

## 2014-08-21 ENCOUNTER — Ambulatory Visit (INDEPENDENT_AMBULATORY_CARE_PROVIDER_SITE_OTHER): Payer: Medicare Other | Admitting: Endocrinology

## 2014-08-21 VITALS — BP 110/62 | HR 70 | Resp 12 | Ht 66.0 in | Wt 193.8 lb

## 2014-08-21 DIAGNOSIS — I10 Essential (primary) hypertension: Secondary | ICD-10-CM | POA: Diagnosis not present

## 2014-08-21 DIAGNOSIS — E1165 Type 2 diabetes mellitus with hyperglycemia: Secondary | ICD-10-CM | POA: Diagnosis not present

## 2014-08-21 NOTE — Assessment & Plan Note (Signed)
Bp at target on current regimen   

## 2014-08-21 NOTE — Progress Notes (Signed)
Pre visit review using our clinic review tool, if applicable. No additional management support is needed unless otherwise documented below in the visit note. 

## 2014-08-21 NOTE — Patient Instructions (Addendum)
Change Humalog to 18BF/ 16 Lunch and 14 units with supper.  Continue current levemir at 26 units daily.   If start to sees to see low sugars below 100, then please let me know. We are trying to get sugars around 180 for you.  Please come back for a follow-up appointment in 1 month. Send me sugars in 2 weeks or sooner if starts to get lows

## 2014-08-21 NOTE — Assessment & Plan Note (Signed)
Goal A1c would be closer to 8% given his age and comorbidities.   His sugars are improving after recent insulin adjustments ( made in between clinic appts) and after infected stent removal. Day time numbers are still elevated, likely from chronic prednisone use, which was recently increased Continue levemir to 26 units daily at bedtime. Change humalog to 18 BF/16 Lunch/14 supper Send me sugars in 2 week for further adjustments or sooner if start to get lows.   Continue Januvia for now, but will probably consider weaning it off in the next few visits. Reviewed insulin pen administration with him and dietary modifications

## 2014-08-21 NOTE — Progress Notes (Signed)
Reason for visit-  [name]. is a 81 y.o.-year-old male, for management of Type 2 diabetes, uncontrolled, with complications ( stage 3 CKD, neuropathy). Associated hx of macrovascular disease (CAD,CHF and stroke). Here with wife, who administers insulin. Last visit April 2016.  HPI- Patient has been diagnosed with diabetes in 23-25 years~age 60s. Recalls being initially on lifestyle modifications.  Tried  Metformin, Glimeperide, Januvia. he has not been insulin since 2016.    *Off metformin since jan 2016>> got started on levemir after recent hospitalization *Now back on januvia after being off of it >> Back on 100 mg daily for several months   * On prednsione since 8-9 months , now on 10 mg daily ( increased from 5 mg ) and plan to continue till next appt>> fu in few weeks ( Dr Raul Del, pulm)  * multiple yeast and UTI recently, recd temporary ureteral stent Feb 2016 after episode of urinary blockage, reports that that stent was infected and had a permanent one put in April 4th. Healing well.  Recent ER visit for UTI, now finished another course of abx Plan is for him to be operated on in end June for non obstructing kidney stone   Pt is currently on a regimen of: - januvia 100 mg daily - Levemir 26  units qhs -Humalog 14/14/10     Last hemoglobin A1c was: Lab Results  Component Value Date   HGBA1C 10.8* 07/04/2014   HGBA1C 9.6* 02/15/2014   HGBA1C 7.9* 06/22/2013     Pt checks his sugars 4 a day . Uses ? glucometer. By log they are:  PREMEAL Breakfast Lunch Dinner Bedtime Overall  Glucose range: 160-210 198-245 215-278 166-319   Mean/median:        POST-MEAL PC Breakfast PC Lunch PC Dinner  Glucose range:     Mean/median:       Hypoglycemia-  No lows. Lowest sugar was n/a; he has hypoglycemia awareness at 70. In 2016, got down to 34 and needed EMS assistance  Dietary habits- eats three times daily.  Normally with BF - eggs Lunch -  soup/sandwich,/tuna/breast chicken/salad. Variable eating times. Sweet tea once weekly Supper- hold off on bread, sweet potatotes Exercise- recent stroke this Winter 2015, now with walker>>vison still blurred, home nurse weekly visits, OT, PT done Weight - lost 15 lbs in past 2-3 months>>now gaining it back Wt Readings from Last 3 Encounters:  08/21/14 193 lb 12.8 oz (87.907 kg)  07/18/14 187 lb (84.823 kg)  07/04/14 181 lb (82.101 kg)    Diabetes Complications-  Nephropathy- Yes Stage 3  CKD, last BUN/creatinine-   Lab Results  Component Value Date   BUN 19 08/05/2014   CREATININE 1.20 08/05/2014   Lab Results  Component Value Date   GFR 53.57* 02/07/2014   No results found for: MICRALBCREAT      Retinopathy- No, Last DEE was in 2016 , no DR, got new glasses Neuropathy- has numbness and tingling in )his feet. Known neuropathy. On neurontin  Associated history - CAD s/p stents, CHF, afib . Prior stroke and recent stroke. No hypothyroidism. his last TSH was No results found for: TSH  Hyperlipidemia-  his last set of lipids were- Currently on Lipitor 40. Tolerating well.   Lab Results  Component Value Date   CHOL 109 06/22/2013   HDL 29.80* 06/22/2013   LDLCALC 41 06/22/2013   TRIG 192.0* 06/22/2013   CHOLHDL 4 06/22/2013    Blood Pressure/HTN- Patient's blood pressure is well  controlled today on current regimen.    I have reviewed the patient's past medical history, medications and allergies.   Current Outpatient Prescriptions on File Prior to Visit  Medication Sig Dispense Refill  . acetaminophen (TYLENOL) 650 MG CR tablet Take 650 mg by mouth every 8 (eight) hours as needed.      Marland Kitchen albuterol (2.5 MG/3ML) 0.083% NEBU 3 mL, albuterol (5 MG/ML) 0.5% NEBU 0.5 mL Inhale 1 mg into the lungs.    Marland Kitchen albuterol (PROVENTIL HFA;VENTOLIN HFA) 108 (90 BASE) MCG/ACT inhaler Inhale 2 puffs into the lungs every 6 (six) hours as needed.      . ALPRAZolam (XANAX) 0.25 MG tablet Take 1  tablet (0.25 mg total) by mouth at bedtime as needed for anxiety. 90 tablet 0  . apixaban (ELIQUIS) 5 MG TABS tablet Take 1 tablet (5 mg total) by mouth 2 (two) times daily. 180 tablet 3  . atorvastatin (LIPITOR) 40 MG tablet Take 40 mg by mouth daily.      . bimatoprost (LUMIGAN) 0.03 % ophthalmic solution Place 1 drop into both eyes at bedtime.     . budesonide-formoterol (SYMBICORT) 160-4.5 MCG/ACT inhaler Inhale 2 puffs into the lungs 2 (two) times daily.    . citalopram (CELEXA) 10 MG tablet Take 1 tablet (10 mg total) by mouth daily. 90 tablet 1  . finasteride (PROSCAR) 5 MG tablet Take 5 mg by mouth daily.    . fluconazole (DIFLUCAN) 200 MG tablet Take 1 tablet by mouth daily.    . furosemide (LASIX) 40 MG tablet Alternate '40mg'$  and '20mg'$  per day.  If your weight is up, then temporarily resume '40mg'$  each day. 90 tablet 3  . gabapentin (NEURONTIN) 100 MG capsule Take 1 capsule (100 mg total) by mouth 3 (three) times daily. 270 capsule 1  . insulin lispro (HUMALOG KWIKPEN) 100 UNIT/ML KiwkPen 11 units with breakfast, 11 units with with lunch and 9 units with supper 11 pen 1  . Insulin Pen Needle 32G X 4 MM MISC Use three times daily for insulin admin 100 each 3  . Insulin Syringe-Needle U-100 30G X 1/2" 1 ML MISC 1 each by Does not apply route 3 (three) times daily. 200 each 5  . LEVEMIR 100 UNIT/ML injection Inject 0.25 mLs (25 Units total) into the skin at bedtime. Dx E11.65 (Patient taking differently: Inject 28 Units into the skin at bedtime. Dx E11.65) 30 mL 1  . metoprolol (TOPROL-XL) 50 MG 24 hr tablet Take 50 mg by mouth 2 (two) times daily.     . nitroGLYCERIN (NITROSTAT) 0.4 MG SL tablet Place 0.4 mg under the tongue every 5 (five) minutes as needed.      . nystatin-triamcinolone (MYCOLOG II) cream Apply 1 application topically 2 (two) times daily.     . potassium chloride (K-DUR) 10 MEQ tablet Take 3 tablets (30 mEq total) by mouth daily. 270 tablet 3  . predniSONE (DELTASONE) 10 MG  tablet '10mg'$  a day for 1 week, then back down to '5mg'$  a day. (Patient taking differently: Take 5 mg by mouth daily with breakfast. )    . sitaGLIPtin (JANUVIA) 100 MG tablet Take 1 tablet (100 mg total) by mouth daily. 90 tablet 0  . tamsulosin (FLOMAX) 0.4 MG CAPS capsule TAKE 1 CAPSULE DAILY 90 capsule 1  . tiotropium (SPIRIVA) 18 MCG inhalation capsule Place 18 mcg into inhaler and inhale daily.       No current facility-administered medications on file prior to visit.  Allergies  Allergen Reactions  . Morphine And Related Other (See Comments)    Hallucinations   . Niacin And Related Dermatitis     Review of Systems- [ x ]  Complains of    [  ]  denies [  ] Recent weight change [  ]  Fatigue [  ] polydipsia [  ] polyuria  [  ]  vision difficulty [  ] chest pain [  ] shortness of breath  [  ] cough [  ] nausea/vomiting [  ] diarrhea [  ] constipation [  ] abdominal pain [  ]  tingling/numbness in extremities [  ]  concern with feet ( wounds/sores)   PE: BP 110/62 mmHg  Pulse 70  Resp 12  Ht '5\' 6"'$  (1.676 m)  Wt 193 lb 12.8 oz (87.907 kg)  BMI 31.30 kg/m2  SpO2 96% Wt Readings from Last 3 Encounters:  08/21/14 193 lb 12.8 oz (87.907 kg)  07/18/14 187 lb (84.823 kg)  07/04/14 181 lb (82.101 kg)   Exam: deferred  ASSESSMENT AND PLAN: Problem List Items Addressed This Visit      Cardiovascular and Mediastinum   Essential hypertension    Bp at target on current regimen            Endocrine   Type 2 diabetes mellitus with hyperglycemia - Primary    Goal A1c would be closer to 8% given his age and comorbidities.   His sugars are improving after recent insulin adjustments ( made in between clinic appts) and after infected stent removal. Day time numbers are still elevated, likely from chronic prednisone use, which was recently increased Continue levemir to 26 units daily at bedtime. Change humalog to 18 BF/16 Lunch/14 supper Send me sugars in 2 week for further  adjustments or sooner if start to get lows.   Continue Januvia for now, but will probably consider weaning it off in the next few visits. Reviewed insulin pen administration with him and dietary modifications              - Return to clinic in 4 weeks with sugar log/meter.  [name] 08/21/2014 10:32 AM

## 2014-09-03 ENCOUNTER — Encounter: Payer: Self-pay | Admitting: Internal Medicine

## 2014-09-03 ENCOUNTER — Telehealth: Payer: Self-pay

## 2014-09-03 ENCOUNTER — Ambulatory Visit (INDEPENDENT_AMBULATORY_CARE_PROVIDER_SITE_OTHER): Payer: Medicare Other | Admitting: Internal Medicine

## 2014-09-03 VITALS — BP 118/80 | HR 65 | Temp 97.5°F | Wt 196.0 lb

## 2014-09-03 DIAGNOSIS — R0789 Other chest pain: Secondary | ICD-10-CM

## 2014-09-03 NOTE — Telephone Encounter (Signed)
Abelino Derrick nurse case mgr with [facility] for heart failure case mgt pilot program left v/m requesting most recent BP and heart rate, A1C lab. Left v/m 09/03/14  BP 118/80 P 65 and need to call [facility] Dr Hillary [place]  [phone] for A1C results.

## 2014-09-03 NOTE — Patient Instructions (Signed)

## 2014-09-03 NOTE — Progress Notes (Signed)
Pre visit review using our clinic review tool, if applicable. No additional management support is needed unless otherwise documented below in the visit note. 

## 2014-09-03 NOTE — Progress Notes (Signed)
Subjective:    Patient ID: Shawn Grace., male    DOB: [birth year], 81 y.o.   MRN: [id number]  HPI  Pt presents to the clinic today with c/o left chest wall pain. This started 3 days ago after he leaned over to pick something off the floor. He was sitting in the chair and he heard something "pop". He immediately felt pain in his right left chest. He describes the pain as sharp and tender. It hurts worse when he takes a deep breath. It is worse with certain movements. He does not feel like it is his heart. He denies cough or worsening shortness of breath. He has taken Tylenol with some relief.  Review of Systems      Past Medical History  Diagnosis Date  . MI     a. tx'd w/ TPA at [facility] 01/29/1996  . HYPERTENSION   . HYPERLIPIDEMIA   . CAD     a. Myoview 06/2005: EF 50%, scar @ apex, mild peri-infarct ischemia  . Chronic atrial fibrillation     a. since 2006; b. on warfarin  . DM   . History of kidney stones   . Neuropathy of both feet   . Chronic diastolic CHF (congestive heart failure)     a. echo 04/2006: EF lower limits of nl, mod LVH, mild aortic root dilatation, & mild MR, biatrial enlargement; b. echo 04/2013: EF 60%, mod dilated LA, mild MR & TR, mod pulm HTN w/ RV systolic pressure 53, c. echo 04/21/14: EF 55-60%, unable to exclude WMA, severely dilated LA 6.6 cm, nl RVSP, mildly dilated aortic root  . Kidney stone     a. s/p left ureteral stenting 04/24/14  . Arrhythmia   . COPD (chronic obstructive pulmonary disease)   . CVA [id number]  . GERD (gastroesophageal reflux disease)   . Cancer     skin  . Arthritis     Current Outpatient Prescriptions  Medication Sig Dispense Refill  . acetaminophen (TYLENOL) 650 MG CR tablet Take 650 mg by mouth every 8 (eight) hours as needed.      Marland Kitchen albuterol (2.5 MG/3ML) 0.083% NEBU 3 mL, albuterol (5 MG/ML) 0.5% NEBU 0.5 mL Inhale 1 mg into the lungs.    Marland Kitchen albuterol (PROVENTIL HFA;VENTOLIN HFA) 108 (90 BASE) MCG/ACT inhaler Inhale 2  puffs into the lungs every 6 (six) hours as needed.      . ALPRAZolam (XANAX) 0.25 MG tablet Take 1 tablet (0.25 mg total) by mouth at bedtime as needed for anxiety. 90 tablet 0  . apixaban (ELIQUIS) 5 MG TABS tablet Take 1 tablet (5 mg total) by mouth 2 (two) times daily. 180 tablet 3  . atorvastatin (LIPITOR) 40 MG tablet Take 40 mg by mouth daily.      . bimatoprost (LUMIGAN) 0.03 % ophthalmic solution Place 1 drop into both eyes at bedtime.     . budesonide-formoterol (SYMBICORT) 160-4.5 MCG/ACT inhaler Inhale 2 puffs into the lungs 2 (two) times daily.    . citalopram (CELEXA) 10 MG tablet Take 1 tablet (10 mg total) by mouth daily. 90 tablet 1  . finasteride (PROSCAR) 5 MG tablet Take 5 mg by mouth daily.    . fluconazole (DIFLUCAN) 200 MG tablet Take 1 tablet by mouth daily.    . furosemide (LASIX) 40 MG tablet Alternate '40mg'$  and '20mg'$  per day.  If your weight is up, then temporarily resume '40mg'$  each day. 90 tablet 3  . gabapentin (NEURONTIN) 100 MG capsule  Take 1 capsule (100 mg total) by mouth 3 (three) times daily. 270 capsule 1  . insulin lispro (HUMALOG KWIKPEN) 100 UNIT/ML KiwkPen 11 units with breakfast, 11 units with with lunch and 9 units with supper (Patient taking differently: 18 units with breakfast, 16 units with with lunch and 14 units with supper) 11 pen 1  . Insulin Pen Needle 32G X 4 MM MISC Use three times daily for insulin admin 100 each 3  . Insulin Syringe-Needle U-100 30G X 1/2" 1 ML MISC 1 each by Does not apply route 3 (three) times daily. 200 each 5  . LEVEMIR 100 UNIT/ML injection Inject 0.25 mLs (25 Units total) into the skin at bedtime. Dx E11.65 (Patient taking differently: Inject 26 Units into the skin at bedtime. Dx E11.65) 30 mL 1  . metoprolol (TOPROL-XL) 50 MG 24 hr tablet Take 50 mg by mouth 2 (two) times daily.     . nitroGLYCERIN (NITROSTAT) 0.4 MG SL tablet Place 0.4 mg under the tongue every 5 (five) minutes as needed.      . nystatin-triamcinolone  (MYCOLOG II) cream Apply 1 application topically 2 (two) times daily.     . potassium chloride (K-DUR) 10 MEQ tablet Take 3 tablets (30 mEq total) by mouth daily. 270 tablet 3  . predniSONE (DELTASONE) 10 MG tablet '10mg'$  a day for 1 week, then back down to '5mg'$  a day. (Patient taking differently: Take 5 mg by mouth daily with breakfast. )    . sitaGLIPtin (JANUVIA) 100 MG tablet Take 1 tablet (100 mg total) by mouth daily. 90 tablet 0  . tamsulosin (FLOMAX) 0.4 MG CAPS capsule TAKE 1 CAPSULE DAILY 90 capsule 1  . tiotropium (SPIRIVA) 18 MCG inhalation capsule Place 18 mcg into inhaler and inhale daily.       No current facility-administered medications for this visit.    Allergies  Allergen Reactions  . Contrast Media [Iodinated Diagnostic Agents] Shortness Of Breath  . Morphine And Related Other (See Comments)    Hallucinations   . Niacin And Related Dermatitis    Family History  Problem Relation Age of Onset  . Heart disease Maternal Grandmother   . Diabetes Maternal Grandmother   . Cancer Neg Hx   . Stroke Neg Hx   . Heart disease Mother     History   Social History  . Marital Status: Married    Spouse Name: N/A  . Number of Children: N/A  . Years of Education: N/A   Occupational History  . Not on file.   Social History Main Topics  . Smoking status: Former Smoker -- 2.00 packs/day for 40 years    Types: Cigarettes    Quit date: 05/24/1990  . Smokeless tobacco: Former Systems developer    Quit date: 05/24/1990  . Alcohol Use: No  . Drug Use: No  . Sexual Activity: Not Currently   Other Topics Concern  . Not on file   Social History Narrative     Constitutional: Denies fever, malaise, fatigue, headache or abrupt weight changes.  Respiratory: Denies difficulty breathing, shortness of breath, cough or sputum production.   Cardiovascular: Denies chest pain, chest tightness, palpitations or swelling in the hands or feet.  Musculoskeletal: Pt reports left chest wall pain.  Denies decrease in range of motion, difficulty with gait, or joint pain and swelling.    No other specific complaints in a complete review of systems (except as listed in HPI above).  Objective:   Physical Exam  BP  118/80 mmHg  Pulse 65  Temp(Src) 97.5 F (36.4 C) (Oral)  Wt 196 lb (88.905 kg)  SpO2 98% Wt Readings from Last 3 Encounters:  09/03/14 196 lb (88.905 kg)  08/21/14 193 lb 12.8 oz (87.907 kg)  07/18/14 187 lb (84.823 kg)    General: Appears his stated age, obese in NAD. Skin: Warm, dry and intact. No bruising or swelling noted of the left chest wall. Cardiovascular: Normal rate with irregular rhythm. S1,S2 noted.  No murmur, rubs or gallops noted.  Pulmonary/Chest: Normal effort and diminished breath sounds. No respiratory distress. No wheezes, rales or ronchi noted.  Abdomen: Distended but soft and nontender. Normal bowel sounds, no bruits noted.  Musculoskeletal: Pain reproduced with palpation of the chest wall just inferior to the armpit. Ribs appear to be intact. Neurological: Alert and oriented.   BMET    Component Value Date/Time   NA 137 08/05/2014 1839   NA 136 02/07/2014 1510   K 3.6 08/05/2014 1839   K 4.0 02/07/2014 1510   CL 106 08/05/2014 1839   CL 104 02/07/2014 1510   CO2 23 08/05/2014 1839   CO2 26 02/07/2014 1510   GLUCOSE 235* 08/05/2014 1839   GLUCOSE 193* 02/07/2014 1510   BUN 19 08/05/2014 1839   BUN 29* 02/07/2014 1510   CREATININE 1.20 08/05/2014 1839   CREATININE 1.4 02/07/2014 1510   CALCIUM 8.6* 08/05/2014 1839   CALCIUM 8.9 02/07/2014 1510   GFRNONAA 57* 08/05/2014 1839   GFRAA >60 08/05/2014 1839    Lipid Panel     Component Value Date/Time   CHOL 109 06/22/2013 1416   TRIG 192.0* 06/22/2013 1416   HDL 29.80* 06/22/2013 1416   CHOLHDL 4 06/22/2013 1416   VLDL 38.4 06/22/2013 1416   LDLCALC 41 06/22/2013 1416    CBC    Component Value Date/Time   WBC 17.2* 08/05/2014 1839   WBC 11.7* 05/20/2014 1501   RBC 4.86  08/05/2014 1839   RBC 4.65 05/20/2014 1501   HGB 13.3 08/05/2014 1839   HGB 12.9* 05/20/2014 1501   HCT 40.1 08/05/2014 1839   HCT 38.9* 05/20/2014 1501   PLT 246 08/05/2014 1839   PLT 274.0 05/20/2014 1501   MCV 83 08/05/2014 1839   MCV 83.5 05/20/2014 1501   MCH 27.3 08/05/2014 1839   MCHC 33.1 08/05/2014 1839   MCHC 33.2 05/20/2014 1501   RDW 16.3* 08/05/2014 1839   RDW 18.0* 05/20/2014 1501   LYMPHSABS 2.3 08/05/2014 1839   LYMPHSABS 2.2 05/20/2014 1501   MONOABS 2.0* 08/05/2014 1839   MONOABS 1.3* 05/20/2014 1501   EOSABS 0.1 08/05/2014 1839   EOSABS 0.1 05/20/2014 1501   BASOSABS 0.1 08/05/2014 1839   BASOSABS 0.0 05/20/2014 1501    Hgb A1C Lab Results  Component Value Date   HGBA1C 10.8* 07/04/2014         Assessment & Plan:   Chest wall pain:  Seems to be muscular in origin Advised him to avoid any lifting over the next week Tylenol as needed for pain He will start alternating heat and ice Reassurance provided that this should resolve within a few weeks  He understands to follow up with me if symptoms persist or worsen

## 2014-09-04 ENCOUNTER — Other Ambulatory Visit: Payer: Self-pay | Admitting: Internal Medicine

## 2014-09-13 ENCOUNTER — Emergency Department: Payer: Medicare Other

## 2014-09-13 ENCOUNTER — Emergency Department
Admission: EM | Admit: 2014-09-13 | Discharge: 2014-09-13 | Disposition: A | Discharge status: Home / Self Care | Payer: Medicare Other | Attending: Emergency Medicine | Admitting: Emergency Medicine

## 2014-09-13 ENCOUNTER — Encounter: Payer: Self-pay | Admitting: *Deleted

## 2014-09-13 DIAGNOSIS — I10 Essential (primary) hypertension: Secondary | ICD-10-CM | POA: Diagnosis not present

## 2014-09-13 DIAGNOSIS — Z7951 Long term (current) use of inhaled steroids: Secondary | ICD-10-CM | POA: Diagnosis not present

## 2014-09-13 DIAGNOSIS — Z79899 Other long term (current) drug therapy: Secondary | ICD-10-CM | POA: Insufficient documentation

## 2014-09-13 DIAGNOSIS — Y9289 Other specified places as the place of occurrence of the external cause: Secondary | ICD-10-CM | POA: Insufficient documentation

## 2014-09-13 DIAGNOSIS — Z7902 Long term (current) use of antithrombotics/antiplatelets: Secondary | ICD-10-CM | POA: Insufficient documentation

## 2014-09-13 DIAGNOSIS — S4992XA Unspecified injury of left shoulder and upper arm, initial encounter: Secondary | ICD-10-CM | POA: Diagnosis present

## 2014-09-13 DIAGNOSIS — W1830XA Fall on same level, unspecified, initial encounter: Secondary | ICD-10-CM | POA: Diagnosis not present

## 2014-09-13 DIAGNOSIS — Z794 Long term (current) use of insulin: Secondary | ICD-10-CM | POA: Insufficient documentation

## 2014-09-13 DIAGNOSIS — S40022A Contusion of left upper arm, initial encounter: Secondary | ICD-10-CM | POA: Diagnosis not present

## 2014-09-13 DIAGNOSIS — E119 Type 2 diabetes mellitus without complications: Secondary | ICD-10-CM | POA: Insufficient documentation

## 2014-09-13 DIAGNOSIS — Y9389 Activity, other specified: Secondary | ICD-10-CM | POA: Insufficient documentation

## 2014-09-13 DIAGNOSIS — Z87891 Personal history of nicotine dependence: Secondary | ICD-10-CM | POA: Insufficient documentation

## 2014-09-13 DIAGNOSIS — Y998 Other external cause status: Secondary | ICD-10-CM | POA: Insufficient documentation

## 2014-09-13 MED ORDER — HYDROCODONE-ACETAMINOPHEN 5-325 MG PO TABS: 1.0000 | ORAL_TABLET | ORAL | Status: DC | PRN

## 2014-09-13 MED ORDER — HYDROCODONE-ACETAMINOPHEN 5-325 MG PO TABS
1.0000 | ORAL_TABLET | Freq: Once | ORAL | Status: AC
  Administered 2014-09-13: 1 via ORAL

## 2014-09-13 MED ORDER — HYDROCODONE-ACETAMINOPHEN 5-325 MG PO TABS
ORAL_TABLET | ORAL | Status: AC
  Filled 2014-09-13: qty 1

## 2014-09-13 NOTE — Discharge Instructions (Signed)
° °  FOLLOW UP WITH YOUR REGULAR DOCTOR OR DR. HOOTEN IF ANY CONTINUED PROBLEMS  ICE TO ARM AS NEEDED  USE [facility] FOR SUPPORT [facility] FOR PAIN AS NEEDED   THIS MAY CAUSE DROWSINESS SO BE AWARE OF FALL RISK

## 2014-09-13 NOTE — ED Provider Notes (Signed)
[facility] Emergency Department Provider Note  ____________________________________________  Time seen: Approximately 12:30 PM  I have reviewed the triage vital signs and the nursing notes.   HISTORY  Chief Complaint Fall   HPI [name]. is a 81 y.o. male patient complains of left upper arm pain. He states he was getting off the couch this morning when he lost his balance and fell. He denies any head injury or loss of consciousness. He has had pain in his left shoulder and arm since that time. He has not taken any medication for this and currently rates his pain 8 out of 10. Range of motion increases his pain while letting his arm stay still decreases it.   Past Medical History  Diagnosis Date  . MI     a. tx'd w/ TPA at [facility] 01/29/1996  . HYPERTENSION   . HYPERLIPIDEMIA   . CAD     a. Myoview 06/2005: EF 50%, scar @ apex, mild peri-infarct ischemia  . Chronic atrial fibrillation     a. since 2006; b. on warfarin  . DM   . History of kidney stones   . Neuropathy of both feet   . Chronic diastolic CHF (congestive heart failure)     a. echo 04/2006: EF lower limits of nl, mod LVH, mild aortic root dilatation, & mild MR, biatrial enlargement; b. echo 04/2013: EF 60%, mod dilated LA, mild MR & TR, mod pulm HTN w/ RV systolic pressure 53, c. echo 04/21/14: EF 55-60%, unable to exclude WMA, severely dilated LA 6.6 cm, nl RVSP, mildly dilated aortic root  . Kidney stone     a. s/p left ureteral stenting 04/24/14  . Arrhythmia   . COPD (chronic obstructive pulmonary disease)   . CVA [id number]  . GERD (gastroesophageal reflux disease)   . Cancer     skin  . Arthritis     Patient Active Problem List   Diagnosis Date Noted  . Cough 05/21/2014  . Type 2 diabetes mellitus with hyperglycemia 05/14/2014  . COLD (chronic obstructive lung disease) 05/14/2014  . BPH (benign prostatic hyperplasia) 05/14/2014  . Anxiety 05/14/2014  . Chronic diastolic CHF  (congestive heart failure)   . Falls frequently 09/24/2013  . Insomnia 08/07/2013  . Hyperlipidemia 02/26/2009  . MITRAL REGURGITATION 02/26/2009  . Essential hypertension 02/26/2009  . MI 02/26/2009  . CAD (coronary artery disease) 02/26/2009  . ATRIAL FIBRILLATION 02/26/2009  . CVA (cerebral vascular accident) 02/26/2009    Past Surgical History  Procedure Laterality Date  . Tonsillectomy and adenoidectomy  1959  . Carotid stent insertion  1997  . Cardiac catheterization  1997    [facility]  . Coronary angioplasty  1997    s/p stent placement x 2   . Kidney surgery  05/2013    s/p stent placement   . Bladder surgery      stent placement   . Circumcision  2016    Current Outpatient Rx  Name  Route  Sig  Dispense  Refill  . acetaminophen (TYLENOL) 650 MG CR tablet   Oral   Take 650 mg by mouth every 8 (eight) hours as needed.           Marland Kitchen albuterol (2.5 MG/3ML) 0.083% NEBU 3 mL, albuterol (5 MG/ML) 0.5% NEBU 0.5 mL   Inhalation   Inhale 1 mg into the lungs.         Marland Kitchen albuterol (PROVENTIL HFA;VENTOLIN HFA) 108 (90 BASE) MCG/ACT inhaler  Inhalation   Inhale 2 puffs into the lungs every 6 (six) hours as needed.           . ALPRAZolam (XANAX) 0.25 MG tablet   Oral   Take 1 tablet (0.25 mg total) by mouth at bedtime as needed for anxiety.   90 tablet   0   . apixaban (ELIQUIS) 5 MG TABS tablet   Oral   Take 1 tablet (5 mg total) by mouth 2 (two) times daily.   180 tablet   3   . atorvastatin (LIPITOR) 40 MG tablet   Oral   Take 40 mg by mouth daily.           . bimatoprost (LUMIGAN) 0.03 % ophthalmic solution   Both Eyes   Place 1 drop into both eyes at bedtime.          . budesonide-formoterol (SYMBICORT) 160-4.5 MCG/ACT inhaler   Inhalation   Inhale 2 puffs into the lungs 2 (two) times daily.         . citalopram (CELEXA) 10 MG tablet   Oral   Take 1 tablet (10 mg total) by mouth daily.   90 tablet   1   . finasteride (PROSCAR) 5 MG tablet    Oral   Take 5 mg by mouth daily.         . fluconazole (DIFLUCAN) 200 MG tablet   Oral   Take 1 tablet by mouth daily.         . furosemide (LASIX) 40 MG tablet      Alternate '40mg'$  and '20mg'$  per day.  If your weight is up, then temporarily resume '40mg'$  each day.   90 tablet   3     *Please dispense 90 day supply*   . gabapentin (NEURONTIN) 100 MG capsule   Oral   Take 1 capsule (100 mg total) by mouth 3 (three) times daily.   270 capsule   1     Discontinue #90 30 day supply   . HYDROcodone-acetaminophen (NORCO/VICODIN) 5-325 MG per tablet   Oral   Take 1 tablet by mouth every 4 (four) hours as needed for moderate pain.   20 tablet   0   . insulin lispro (HUMALOG KWIKPEN) 100 UNIT/ML KiwkPen      11 units with breakfast, 11 units with with lunch and 9 units with supper Patient taking differently: 18 units with breakfast, 16 units with with lunch and 14 units with supper   11 pen   1   . Insulin Pen Needle 32G X 4 MM MISC      Use three times daily for insulin admin   100 each   3   . Insulin Syringe-Needle U-100 30G X 1/2" 1 ML MISC   Does not apply   1 each by Does not apply route 3 (three) times daily.   200 each   5   . LEVEMIR 100 UNIT/ML injection      INJECT 28 UNITS TOTAL UNDER THE SKIN AT BEDTIME. ADD 2 UNITS PER DAY UNTIL MORNING SUGAR IS LESS THAN 140 Patient taking differently: INJECT 26 UNITS TOTAL UNDER THE SKIN AT BEDTIME. ADD 2 UNITS PER DAY UNTIL MORNING SUGAR IS LESS THAN 140   3 vial   5     Dispense as written.   . metoprolol (TOPROL-XL) 50 MG 24 hr tablet   Oral   Take 50 mg by mouth 2 (two) times daily.          Marland Kitchen  nitroGLYCERIN (NITROSTAT) 0.4 MG SL tablet   Sublingual   Place 0.4 mg under the tongue every 5 (five) minutes as needed.           . nystatin-triamcinolone (MYCOLOG II) cream   Topical   Apply 1 application topically 2 (two) times daily.          . potassium chloride (K-DUR) 10 MEQ tablet   Oral   Take 3  tablets (30 mEq total) by mouth daily.   270 tablet   3     *Please dispense 90 day supply*   . predniSONE (DELTASONE) 10 MG tablet      '10mg'$  a day for 1 week, then back down to '5mg'$  a day. Patient taking differently: Take 5 mg by mouth daily with breakfast.          . sitaGLIPtin (JANUVIA) 100 MG tablet   Oral   Take 1 tablet (100 mg total) by mouth daily.   90 tablet   0   . tamsulosin (FLOMAX) 0.4 MG CAPS capsule      TAKE 1 CAPSULE DAILY   90 capsule   1   . tiotropium (SPIRIVA) 18 MCG inhalation capsule   Inhalation   Place 18 mcg into inhaler and inhale daily.             Allergies Contrast media; Morphine and related; and Niacin and related  Family History  Problem Relation Age of Onset  . Heart disease Maternal Grandmother   . Diabetes Maternal Grandmother   . Cancer Neg Hx   . Stroke Neg Hx   . Heart disease Mother     Social History History  Substance Use Topics  . Smoking status: Former Smoker -- 2.00 packs/day for 40 years    Types: Cigarettes    Quit date: 05/24/1990  . Smokeless tobacco: Former Systems developer    Quit date: 05/24/1990  . Alcohol Use: No    Review of Systems Constitutional: No fever/chills Eyes: No visual changes. Cardiovascular: Denies chest pain. Respiratory: Denies shortness of breath. Gastrointestinal: No abdominal pain.  No nausea, no vomiting.  Genitourinary: Negative for dysuria. Musculoskeletal: Negative for back pain. Skin: Negative for rash. Neurological: Negative for headaches, focal weakness or numbness.  10-point ROS otherwise negative.  ____________________________________________   PHYSICAL EXAM:  VITAL SIGNS: ED Triage Vitals  Enc Vitals Group     BP 09/13/14 1152 152/68 mmHg     Pulse Rate 09/13/14 1152 69     Resp 09/13/14 1152 20     Temp 09/13/14 1152 97.9 F (36.6 C)     Temp Source 09/13/14 1152 Oral     SpO2 09/13/14 1152 95 %     Weight 09/13/14 1152 192 lb (87.091 kg)     Height 09/13/14  1152 '5\' 6"'$  (1.676 m)     Head Cir --      Peak Flow --      Pain Score 09/13/14 1153 8     Pain Loc --      Pain Edu? --      Excl. in [place]? --     Constitutional: Alert and oriented. Well appearing and in no acute distress. Eyes: Conjunctivae are normal. PERRL. EOMI. Head: Atraumatic. Nose: No congestion/rhinnorhea. Neck: No stridor. Cardiovascular: Normal rate, regular rhythm. Grossly normal heart sounds.  Good peripheral circulation. Respiratory: Normal respiratory effort.  No retractions. Lungs CTAB. Gastrointestinal: Soft and nontender. No distention. No abdominal bruits. No CVA tenderness. Musculoskeletal: No lower extremity tenderness nor  edema.  No joint effusions. Left midhumerus moderate tenderness on palpation. There is no gross deformity. Minimal edema and no ecchymosis was noted. Range of motion was restricted secondary to pain. Neurologic:  Normal speech and language. No gross focal neurologic deficits are appreciated. Speech is normal. No gait instability. Skin:  Skin is warm, dry and intact. No rash noted. Psychiatric: Mood and affect are normal. Speech and behavior are normal.  ____________________________________________   LABS (all labs ordered are listed, but only abnormal results are displayed)  Labs Reviewed - No data to display ____________________________________________  EKG  Deferred ____________________________________________  RADIOLOGY  No fracture or dislocation per radiologist ____________________________________________   PROCEDURES  Procedure(s) performed: None  Critical Care performed: No  ____________________________________________   INITIAL IMPRESSION / ASSESSMENT AND PLAN / ED COURSE  Pertinent labs & imaging results that were available during my care of the patient were reviewed by me and considered in my medical decision making (see chart for details).  Patient was placed in a sling and given pain medication. He is to  follow-up with his doctor or Dr. Marry Guan if needed. ____________________________________________   FINAL CLINICAL IMPRESSION(S) / ED DIAGNOSES  Final diagnoses:  Contusion, arm, upper, left, initial encounter      Shawn Hai, PA-C 09/13/14 [place], MD 09/14/14 1544

## 2014-09-18 ENCOUNTER — Ambulatory Visit: Payer: Medicare Other | Attending: Family | Admitting: Family

## 2014-09-18 ENCOUNTER — Encounter: Payer: Self-pay | Admitting: Family

## 2014-09-18 VITALS — BP 131/68 | HR 65 | Resp 20 | Ht 66.0 in | Wt 200.0 lb

## 2014-09-18 DIAGNOSIS — Z79899 Other long term (current) drug therapy: Secondary | ICD-10-CM | POA: Diagnosis not present

## 2014-09-18 DIAGNOSIS — E119 Type 2 diabetes mellitus without complications: Secondary | ICD-10-CM | POA: Diagnosis not present

## 2014-09-18 DIAGNOSIS — I509 Heart failure, unspecified: Secondary | ICD-10-CM | POA: Insufficient documentation

## 2014-09-18 DIAGNOSIS — I4891 Unspecified atrial fibrillation: Secondary | ICD-10-CM | POA: Insufficient documentation

## 2014-09-18 DIAGNOSIS — I10 Essential (primary) hypertension: Secondary | ICD-10-CM

## 2014-09-18 DIAGNOSIS — R296 Repeated falls: Secondary | ICD-10-CM

## 2014-09-18 DIAGNOSIS — Z794 Long term (current) use of insulin: Secondary | ICD-10-CM | POA: Diagnosis not present

## 2014-09-18 DIAGNOSIS — M25522 Pain in left elbow: Secondary | ICD-10-CM | POA: Diagnosis not present

## 2014-09-18 DIAGNOSIS — I5032 Chronic diastolic (congestive) heart failure: Secondary | ICD-10-CM

## 2014-09-18 NOTE — Patient Instructions (Signed)
Continue weighing daily and take an additional dose of fluid pill for an overnight weight gain of >2 pounds or a weekly weight gain of >5 pounds.

## 2014-09-18 NOTE — Progress Notes (Addendum)
Subjective:    Patient ID: [name]., male    DOB: 11-Feb-1932, 81 y.o.   MRN: [id number]  Congestive Heart Failure Presents for follow-up visit. The disease course has been stable. Associated symptoms include edema, fatigue and shortness of breath. Pertinent negatives include no abdominal pain, chest pain or palpitations. The symptoms have been stable. Past treatments include beta blockers and salt and fluid restriction. The treatment provided mild relief. His past medical history is significant for arrhythmia, DM and HTN.  Fall The accident occurred 5 to 7 days ago. The fall occurred while standing. He fell from a height of 1 to 2 ft. He landed on carpet. The volume of blood lost was minimal. The point of impact was the left shoulder and left elbow. The pain is present in the left upper arm and left elbow. The pain is at a severity of 4/10. The pain is mild. The symptoms are aggravated by movement. Pertinent negatives include no abdominal pain, headaches or hematuria. He has tried immobilization for the symptoms. The treatment provided mild relief.   Allergies  Allergen Reactions  . Contrast Media [Iodinated Diagnostic Agents] Shortness Of Breath  . Morphine And Related Other (See Comments)    Hallucinations   . Niacin And Related Dermatitis     Prior to Admission medications   Medication Sig Start Date End Date Taking? Authorizing Provider  acetaminophen (TYLENOL) 650 MG CR tablet Take 650 mg by mouth every 8 (eight) hours as needed.     Yes Historical Provider, MD  albuterol (2.5 MG/3ML) 0.083% NEBU 3 mL, albuterol (5 MG/ML) 0.5% NEBU 0.5 mL Inhale 1 mg into the lungs.   Yes Historical Provider, MD  albuterol (PROVENTIL HFA;VENTOLIN HFA) 108 (90 BASE) MCG/ACT inhaler Inhale 2 puffs into the lungs every 6 (six) hours as needed.     Yes Historical Provider, MD  ALPRAZolam (XANAX) 0.25 MG tablet Take 1 tablet (0.25 mg total) by mouth at bedtime as needed for anxiety. 04/15/14  Yes  Jearld Fenton, NP  apixaban (ELIQUIS) 5 MG TABS tablet Take 1 tablet (5 mg total) by mouth 2 (two) times daily. 06/14/14  Yes Minna Merritts, MD  atorvastatin (LIPITOR) 40 MG tablet Take 40 mg by mouth daily.     Yes Historical Provider, MD  bimatoprost (LUMIGAN) 0.03 % ophthalmic solution Place 1 drop into both eyes at bedtime.    Yes Historical Provider, MD  budesonide-formoterol (SYMBICORT) 160-4.5 MCG/ACT inhaler Inhale 2 puffs into the lungs 2 (two) times daily.   Yes Historical Provider, MD  citalopram (CELEXA) 10 MG tablet Take 1 tablet (10 mg total) by mouth daily. 06/11/14  Yes Jearld Fenton, NP  finasteride (PROSCAR) 5 MG tablet Take 5 mg by mouth daily.   Yes Historical Provider, MD  fluconazole (DIFLUCAN) 200 MG tablet Take 1 tablet by mouth daily. 07/08/14  Yes Historical Provider, MD  furosemide (LASIX) 40 MG tablet Alternate '40mg'$  and '20mg'$  per day.  If your weight is up, then temporarily resume '40mg'$  each day. 02/15/14  Yes Tonia Ghent, MD  gabapentin (NEURONTIN) 100 MG capsule Take 1 capsule (100 mg total) by mouth 3 (three) times daily. 07/04/14  Yes Jearld Fenton, NP  HYDROcodone-acetaminophen (NORCO/VICODIN) 5-325 MG per tablet Take 1 tablet by mouth every 4 (four) hours as needed for moderate pain. 09/13/14  Yes Johnn Hai, PA-C  insulin lispro (HUMALOG KWIKPEN) 100 UNIT/ML KiwkPen 11 units with breakfast, 11 units with with lunch and  9 units with supper Patient taking differently: 18 units with breakfast, 16 units with with lunch and 14 units with supper 07/18/14  Yes Radhika P Phadke, MD  Insulin Pen Needle 32G X 4 MM MISC Use three times daily for insulin admin 07/04/14  Yes Radhika P Phadke, MD  Insulin Syringe-Needle U-100 30G X 1/2" 1 ML MISC 1 each by Does not apply route 3 (three) times daily. 07/19/14  Yes Jearld Fenton, NP  LEVEMIR 100 UNIT/ML injection INJECT 28 UNITS TOTAL UNDER THE SKIN AT BEDTIME. ADD 2 UNITS PER DAY UNTIL MORNING SUGAR IS LESS THAN 140 Patient  taking differently: INJECT 26 UNITS TOTAL UNDER THE SKIN AT BEDTIME. ADD 2 UNITS PER DAY UNTIL MORNING SUGAR IS LESS THAN 140 09/05/14  Yes Jearld Fenton, NP  metoprolol (TOPROL-XL) 50 MG 24 hr tablet Take 50 mg by mouth 2 (two) times daily.    Yes Historical Provider, MD  nitroGLYCERIN (NITROSTAT) 0.4 MG SL tablet Place 0.4 mg under the tongue every 5 (five) minutes as needed.     Yes Historical Provider, MD  nystatin-triamcinolone (MYCOLOG II) cream Apply 1 application topically 2 (two) times daily.  03/12/14  Yes Historical Provider, MD  potassium chloride (K-DUR) 10 MEQ tablet Take 3 tablets (30 mEq total) by mouth daily. 01/23/14  Yes Ryan M Dunn, PA-C  predniSONE (DELTASONE) 10 MG tablet '10mg'$  a day for 1 week, then back down to '5mg'$  a day. Patient taking differently: Take 5 mg by mouth daily with breakfast.  05/20/14  Yes Tonia Ghent, MD  sitaGLIPtin (JANUVIA) 100 MG tablet Take 1 tablet (100 mg total) by mouth daily. 07/30/14  Yes Jearld Fenton, NP  tamsulosin (FLOMAX) 0.4 MG CAPS capsule TAKE 1 CAPSULE DAILY 07/19/14  Yes Jearld Fenton, NP  tiotropium (SPIRIVA) 18 MCG inhalation capsule Place 18 mcg into inhaler and inhale daily.     Yes Historical Provider, MD       Review of Systems  Constitutional: Positive for fatigue. Negative for appetite change.  HENT: Positive for rhinorrhea. Negative for sore throat and trouble swallowing.   Eyes: Negative.   Respiratory: Positive for cough ("sometimes"), shortness of breath and wheezing.   Cardiovascular: Positive for leg swelling. Negative for chest pain and palpitations.  Gastrointestinal: Positive for abdominal distention. Negative for abdominal pain.  Endocrine: Negative.   Genitourinary: Positive for dysuria ("at times"). Negative for frequency and hematuria.  Musculoskeletal: Positive for myalgias (left upper arm). Negative for back pain.  Skin: Negative.   Allergic/Immunologic: Negative.   Neurological: Positive for  light-headedness. Negative for dizziness, weakness and headaches.  Hematological: Negative for adenopathy. Bruises/bleeds easily.  Psychiatric/Behavioral: Negative for sleep disturbance (wearing CPAP at night). The patient is not nervous/anxious.        Objective:   Physical Exam  Constitutional: He is oriented to person, place, and time. He appears well-developed and well-nourished.  HENT:  Head: Normocephalic and atraumatic.  Eyes: Conjunctivae are normal. Pupils are equal, round, and reactive to light.  Neck: Normal range of motion. Neck supple.  Cardiovascular: An irregularly irregular rhythm present.  No murmur heard. Pulmonary/Chest: Effort normal and breath sounds normal. He has no wheezes. He has no rales.  Abdominal: He exhibits distension. There is no tenderness.  Musculoskeletal: He exhibits edema (3+ pitting edema bilateral lower legs). He exhibits no tenderness.  Neurological: He is alert and oriented to person, place, and time.  Skin: Skin is warm and dry. Bruising (left upper arm) noted.  Psychiatric: He has a normal mood and affect. His behavior is normal.  Nursing note and vitals reviewed.         Assessment & Plan:  1: Chronic heart failure with preserved ejection fraction- Patient presents with shortness of breath and fatigue upon exertion. He also says that he tends to get short of breath if he bends over to put his shoes on. He also has swelling in his lower legs but he and his wife both say that the swelling is better than it has been. He normally wears TED hose daily but didn't put them on today because he was coming to the office. He does admit that he doesn't elevate his legs much at home and he was encouraged to do so. Does not add salt to his food and tries to follow a low sodium diet. Weighs daily and he was reminded that he could take an additional fluid pill if his weight goes up more than 2 pounds overnight or more than 5 pounds in a week.  2: Falls:  Patient says that he fell not quite a week ago. Left upper arm is sore and range of motion is limited due to pain. Has seen his PCP since the fall and he was instructed to follow back up with his PCP or with his orthopedist if pain and range of motion doesn't improve. 3: HTN- Blood pressure looks good today. 4: Atrial fibrillation- Currently rate controlled at this time. Taking eliquis instead of warfarin.   Return in 3 months or sooner for any questions/problems before then.

## 2014-09-19 ENCOUNTER — Telehealth: Payer: Self-pay | Admitting: *Deleted

## 2014-09-19 NOTE — Telephone Encounter (Signed)
Pt notified and verbalized understanding.

## 2014-09-19 NOTE — Telephone Encounter (Signed)
-----   Message from Haydee Monica, MD sent at 09/19/2014  1:54 PM EDT ----- Regarding: sugar log reviewed Reviewed his sugars- overall morning sugars are too good - would like to see them around 140- lets decrease levemir to 25 units daily  Sugars through the day are best at lunch time(127-180), but slightly higher at supper ( 156-286). Bedtime readings are variable, but generally around the target.   Change Humalog from 18 BF/16Lunch/14 supper to 18BF/18 lunch and 14 supper.   thanks

## 2014-09-20 ENCOUNTER — Ambulatory Visit (INDEPENDENT_AMBULATORY_CARE_PROVIDER_SITE_OTHER): Payer: Medicare Other | Admitting: Urology

## 2014-09-20 ENCOUNTER — Encounter: Payer: Self-pay | Admitting: Urology

## 2014-09-20 VITALS — BP 104/60 | HR 53 | Ht 66.0 in | Wt 198.5 lb

## 2014-09-20 DIAGNOSIS — N390 Urinary tract infection, site not specified: Secondary | ICD-10-CM | POA: Diagnosis not present

## 2014-09-20 DIAGNOSIS — N200 Calculus of kidney: Secondary | ICD-10-CM

## 2014-09-20 DIAGNOSIS — N400 Enlarged prostate without lower urinary tract symptoms: Secondary | ICD-10-CM

## 2014-09-20 LAB — URINALYSIS, COMPLETE
Bilirubin, UA: NEGATIVE
Glucose, UA: NEGATIVE
Ketones, UA: NEGATIVE
Nitrite, UA: NEGATIVE
Specific Gravity, UA: 1.02 (ref 1.005–1.030)
Urobilinogen, Ur: 0.2 mg/dL (ref 0.2–1.0)
pH, UA: 5.5 (ref 5.0–7.5)

## 2014-09-20 LAB — MICROSCOPIC EXAMINATION: WBC, UA: >30 /hpf — AB (ref 0–?)

## 2014-09-23 ENCOUNTER — Telehealth: Payer: Self-pay | Admitting: Urology

## 2014-09-23 ENCOUNTER — Telehealth: Payer: Self-pay

## 2014-09-23 NOTE — Telephone Encounter (Signed)
Please call lab and have sensitives added for this urine culture from 6/10 for both the yeast (may have already be requested) as well as bacteria.    Hollice Espy, MD

## 2014-09-23 NOTE — Telephone Encounter (Signed)
Needs surgical clearance for 6/29. Advise stopping Eliquis 5 days prior to surgery. Please call. Will fax over order also

## 2014-09-23 NOTE — Telephone Encounter (Signed)
Spoke with Lelan Pons in the lab and these sensitives have been added on and the yeast is still growing

## 2014-09-24 NOTE — Progress Notes (Signed)
09/20/2014 8:45 AM   [name]. [birth year] [id number]  Referring provider: Jearld Fenton, NP [address] [place], [place] [address]  Chief Complaint  Patient presents with  . Nephrolithiasis    HPI: 81 yo M with severe medical comorbidities including CHF, h/o CVA, COPD, DM who with recurrent bacterial and candidial sepsis since 01/2014, urinary retention, phimosis s/p office circ 04/2014, and most recently emergent left ureteral stent placement on 04/24/14 in the setting of sepsis for obstucting ureteral stone. Most recently, his ureteral stent was exchanged on 07/15/14 but the stone was not treated based on his poor functional status at the time and concern for recurrent sepsis which he would not tolerate well.   He has been admitted and discharged several times since that admission for various issues including recent stroke earlier this year. He initially did have some neurological deficits but these have resolved for the most part.  Most recent cross sectional imaging reviewed shows CT abd/ pelvis with large left non obstructing stone burden, left ureteral stone with stent on good position, enlarged prostate.   He tolerated anesthesia during his most recent stent exchange and has done well postop. He's had no further infections. He reports that overall, he is in the best health that he has been in the past year.  No recent fevers or chills. He does occasionally have burning when he voids.   His blood sugar has also dramatically improved over the past month.   He returns to the office today to discuss definitive management of his ureteral stone and hopefully ultimately remove his stent. He does still have a significant left upper tract stone burden.   PMH: Past Medical History  Diagnosis Date  . MI     a. tx'd w/ TPA at [facility] 01/29/1996  . HYPERTENSION   . HYPERLIPIDEMIA   . CAD     a. Myoview 06/2005: EF 50%, scar @ apex, mild peri-infarct ischemia  . Chronic  atrial fibrillation     a. since 2006; b. on warfarin  . DM   . History of kidney stones   . Neuropathy of both feet   . Chronic diastolic CHF (congestive heart failure)     a. echo 04/2006: EF lower limits of nl, mod LVH, mild aortic root dilatation, & mild MR, biatrial enlargement; b. echo 04/2013: EF 60%, mod dilated LA, mild MR & TR, mod pulm HTN w/ RV systolic pressure 53, c. echo 04/21/14: EF 55-60%, unable to exclude WMA, severely dilated LA 6.6 cm, nl RVSP, mildly dilated aortic root  . Kidney stone     a. s/p left ureteral stenting 04/24/14  . Arrhythmia   . COPD (chronic obstructive pulmonary disease)   . CVA [id number]  . GERD (gastroesophageal reflux disease)   . Cancer     skin  . Arthritis     Surgical History: Past Surgical History  Procedure Laterality Date  . Tonsillectomy and adenoidectomy  1959  . Carotid stent insertion  1997  . Cardiac catheterization  1997    [facility]  . Coronary angioplasty  1997    s/p stent placement x 2   . Kidney surgery  05/2013    s/p stent placement   . Bladder surgery      stent placement   . Circumcision  2016    Home Medications:    Medication List       This list is accurate as of: 09/20/14 11:59 PM.  Always use  your most recent med list.               acetaminophen 650 MG CR tablet  Commonly known as:  TYLENOL  Take 650 mg by mouth every 8 (eight) hours as needed.     albuterol (2.5 MG/3ML) 0.083% NEBU 3 mL, albuterol (5 MG/ML) 0.5% NEBU 0.5 mL  Inhale 1 mg into the lungs.     albuterol 108 (90 BASE) MCG/ACT inhaler  Commonly known as:  PROVENTIL HFA;VENTOLIN HFA  Inhale 2 puffs into the lungs every 6 (six) hours as needed.     ALPRAZolam 0.25 MG tablet  Commonly known as:  XANAX  Take 1 tablet (0.25 mg total) by mouth at bedtime as needed for anxiety.     apixaban 5 MG Tabs tablet  Commonly known as:  ELIQUIS  Take 1 tablet (5 mg total) by mouth 2 (two) times daily.     atorvastatin 40 MG tablet  Commonly known  as:  LIPITOR  Take 40 mg by mouth daily.     bimatoprost 0.03 % ophthalmic solution  Commonly known as:  LUMIGAN  Place 1 drop into both eyes at bedtime.     budesonide-formoterol 160-4.5 MCG/ACT inhaler  Commonly known as:  SYMBICORT  Inhale 2 puffs into the lungs 2 (two) times daily.     citalopram 10 MG tablet  Commonly known as:  CELEXA  Take 1 tablet (10 mg total) by mouth daily.     finasteride 5 MG tablet  Commonly known as:  PROSCAR  Take 5 mg by mouth daily.     fluconazole 200 MG tablet  Commonly known as:  DIFLUCAN  Take 1 tablet by mouth daily.     furosemide 40 MG tablet  Commonly known as:  LASIX  Alternate '40mg'$  and '20mg'$  per day.  If your weight is up, then temporarily resume '40mg'$  each day.     gabapentin 100 MG capsule  Commonly known as:  NEURONTIN  Take 1 capsule (100 mg total) by mouth 3 (three) times daily.     HYDROcodone-acetaminophen 5-325 MG per tablet  Commonly known as:  NORCO/VICODIN  Take 1 tablet by mouth every 4 (four) hours as needed for moderate pain.     insulin lispro 100 UNIT/ML KiwkPen  Commonly known as:  HUMALOG KWIKPEN  11 units with breakfast, 11 units with with lunch and 9 units with supper     Insulin Pen Needle 32G X 4 MM Misc  Use three times daily for insulin admin     Insulin Syringe-Needle U-100 30G X 1/2" 1 ML Misc  1 each by Does not apply route 3 (three) times daily.     LEVEMIR 100 UNIT/ML injection  Generic drug:  insulin detemir  INJECT 28 UNITS TOTAL UNDER THE SKIN AT BEDTIME. ADD 2 UNITS PER DAY UNTIL MORNING SUGAR IS LESS THAN 140     metoprolol succinate 50 MG 24 hr tablet  Commonly known as:  TOPROL-XL  Take 50 mg by mouth 2 (two) times daily.     nitroGLYCERIN 0.4 MG SL tablet  Commonly known as:  NITROSTAT  Place 0.4 mg under the tongue every 5 (five) minutes as needed.     nystatin-triamcinolone cream  Commonly known as:  MYCOLOG II  Apply 1 application topically 2 (two) times daily.     potassium  chloride 10 MEQ tablet  Commonly known as:  K-DUR  Take 3 tablets (30 mEq total) by mouth daily.  predniSONE 10 MG tablet  Commonly known as:  DELTASONE  '10mg'$  a day for 1 week, then back down to '5mg'$  a day.     sitaGLIPtin 100 MG tablet  Commonly known as:  JANUVIA  Take 1 tablet (100 mg total) by mouth daily.     tamsulosin 0.4 MG Caps capsule  Commonly known as:  FLOMAX  TAKE 1 CAPSULE DAILY     tiotropium 18 MCG inhalation capsule  Commonly known as:  SPIRIVA  Place 18 mcg into inhaler and inhale daily.        Allergies:  Allergies  Allergen Reactions  . Contrast Media [Iodinated Diagnostic Agents] Shortness Of Breath  . Morphine And Related Other (See Comments)    Hallucinations   . Niacin And Related Dermatitis    Family History: Family History  Problem Relation Age of Onset  . Heart disease Maternal Grandmother   . Diabetes Maternal Grandmother   . Cancer Neg Hx   . Stroke Neg Hx   . Heart disease Mother     Social History:  reports that he quit smoking about 24 years ago. His smoking use included Cigarettes. He has a 80 pack-year smoking history. He quit smokeless tobacco use about 24 years ago. He reports that he does not drink alcohol or use illicit drugs.  Physical Exam: BP 104/60 mmHg  Pulse 53  Ht '5\' 6"'$  (1.676 m)  Wt 198 lb 8 oz (90.039 kg)  BMI 32.05 kg/m2  Constitutional:  Alert and oriented, No acute distress.  Obese, presents to office today with his wife. HEENT: [place] AT, moist mucus membranes.  Trachea midline, no masses. Cardiovascular: No clubbing, cyanosis, or edema.  RRR. Respiratory: Normal respiratory effort, no increased work of breathing.  CTAB. GI: Abdomen is soft, nontender, nondistended, no abdominal masses GU: No CVA tenderness.  Skin: No rashes, bruises or suspicious lesions. Neurologic: Grossly intact, no focal deficits, moving all 4 extremities. Psychiatric: Normal mood and affect.  Laboratory Data: Lab Results  Component  Value Date   WBC 17.2* 08/05/2014   HGB 13.3 08/05/2014   HCT 40.1 08/05/2014   MCV 83 08/05/2014   PLT 246 08/05/2014    Lab Results  Component Value Date   CREATININE 1.20 08/05/2014    Lab Results  Component Value Date   PSA 4.1* 04/24/2013    No results found for: TESTOSTERONE  Lab Results  Component Value Date   HGBA1C 10.8* 07/04/2014    Urinalysis Results for orders placed or performed in visit on 09/20/14  CULTURE, URINE COMPREHENSIVE  Result Value Ref Range   Urine Culture, Comprehensive Final report (A)    Result 1 Yeast isolated. (A)    Result 2 Comment (A)    Result 3 Lactobacillus species   Microscopic Examination  Result Value Ref Range   WBC, UA >30 (A) 0 -  5 /hpf   RBC, UA 3-10 (A) 0 -  2 /hpf   Epithelial Cells (non renal) 0-10 0 - 10 /hpf   Bacteria, UA Few None seen/Few   Yeast, UA Present (A) None seen  Organism Identification, Yeast  Result Value Ref Range   Organism Identification, Yeast Final report    RESULT 1 Candida glabrata   Susceptibility, Aer + Anaerob  Result Value Ref Range   Suscept Result 1 Comment   Urinalysis, Complete  Result Value Ref Range   Specific Gravity, UA 1.020 1.005 - 1.030   pH, UA 5.5 5.0 - 7.5   Color, UA  Yellow Yellow   Appearance Ur Cloudy (A) Clear   Leukocytes, UA 2+ (A) Negative   Protein, UA 2+ (A) Negative/Trace   Glucose, UA Negative Negative   Ketones, UA Negative Negative   RBC, UA 2+ (A) Negative   Bilirubin, UA Negative Negative   Urobilinogen, Ur 0.2 0.2 - 1.0 mg/dL   Nitrite, UA Negative Negative   Microscopic Examination See below:   Specimen status report  Result Value Ref Range   specimen status report Comment     Pertinent Imaging: PROCEDURE: CT - CT ABDOMEN /PELVIS WO (STONE) - Jun 03 2014 3:58PM IMPRESSION: Interval placement of left-sided ureteral stent in grossly good position. Continued presence of large nonobstructive calculus in lower pole collecting system of  left kidney. Nonobstructive 5 mm calculus seen in left renal pelvis. No hydronephrosis or renal obstruction is noted on the right. Cholelithiasis is noted. Mild wall thickening and surrounding inflammation is seen involving the urinary bladder concerning for possible cystitis. 8.5 mm nodule is noted posteriorly in the left lower lobe.  Assessment & Plan:  81 year-old male with multiple comorbidities, poor health status post recent stroke and multiple episodes of urinary sepsis status post LEFT ureteral stent placement on 04/24/2014 for an obstructing ureteral stone and stent exchange on 07/15/14. Definitive management for this stone has been delayed due to recent stroke and other severe medical comorbidities.  1. Nephrolithiasis We discussed today proceeding with ureteroscopy to treat his left ureteral stone. Given his medical status, I do not feel that he would tolerate treatment of his upper tract stones with prolonged stone manipulation and risk of sepsis. Ideally, I'd like to clears ureter of stone and rid him of his stent in order to reduce the risk of recurrent urinary tract infections.  Risks and benefits of ureteroscopy were reviewed including but not limited to infection, bleeding, pain, ureteral injury which could require open surgery versus prolonged indwelling if ureteralperforation occurs, persistent stone disease, requirement for staged procedure, possible stent, and global anesthesia risks. Patient expressed understanding and desires to proceed with ureteroscopy.  He is previously cleared for anesthesia although somewhat high risk.  We again today discuss the increased risk of sepsis given his overall medical health and colonization with bacteria. - Urinalysis, Complete - CULTURE, URINE COMPREHENSIVE  2. BPH (benign prostatic hyperplasia) Continue Flomax/ finasteride.  3. Recurrent UTI Chrronically colonized, routinely grows yeast. We will go ahead and send off a UA/urine culture today  for identity of the yeast strain as well as sensitivities. He is clinically well today therefore would not treat this infection that may reserve it if he spikes another fever, or develops any other urinary or generalized symptoms. Patient and his wife were made aware of this plan and will call the office if he starts to feel unwell. - We'll send UA/urine culture but not treat unless become symptomatic -Plan for double coverage IV abx + antifungal in OR to reduce risk of sepsis with stone manipulation   Hollice Espy, MD  [facility] [address], [place], [place] [address] [phone]

## 2014-09-24 NOTE — Telephone Encounter (Signed)
Received cardiac clearance request for pt to proceed w/ left ureteroscopy w/ laser lithotripsy left stent exchange, requesting to hold Eliquis 2 days prior to surgery on 09/23/14 w/ Dr. Hollice Espy.  Per Christell Faith, PA, pt is cleared, "Please note stopping anticoagulation increases stroke risk.  This should be shared w/ the pt.  Restart ASAP, w/in 24 hrs postprocedure if possible per treating MD.  Pt's CHADSVASCis a least 7, giving him an annual estimated stroke risk of 9.6%. (HTN, age x 2, DM, multiple strokes counts as 2, MI)".  Spoke w/ pt and made him aware of Ryan's recommendation.  Clearance faxed to [facility] @ [phone].

## 2014-09-25 ENCOUNTER — Ambulatory Visit: Payer: Medicare Other | Admitting: Endocrinology

## 2014-09-25 ENCOUNTER — Ambulatory Visit (INDEPENDENT_AMBULATORY_CARE_PROVIDER_SITE_OTHER): Payer: Medicare Other | Admitting: Endocrinology

## 2014-09-25 ENCOUNTER — Encounter: Payer: Self-pay | Admitting: Endocrinology

## 2014-09-25 VITALS — BP 116/70 | HR 75 | Resp 14 | Ht 66.0 in | Wt 197.5 lb

## 2014-09-25 DIAGNOSIS — E785 Hyperlipidemia, unspecified: Secondary | ICD-10-CM | POA: Diagnosis not present

## 2014-09-25 DIAGNOSIS — I10 Essential (primary) hypertension: Secondary | ICD-10-CM

## 2014-09-25 DIAGNOSIS — E1165 Type 2 diabetes mellitus with hyperglycemia: Secondary | ICD-10-CM | POA: Diagnosis not present

## 2014-09-25 NOTE — Progress Notes (Signed)
Pre visit review using our clinic review tool, if applicable. No additional management support is needed unless otherwise documented below in the visit note. 

## 2014-09-25 NOTE — Assessment & Plan Note (Signed)
Managed by cardiology. Last LDL in 2015 was at goal on current therapy  Update with next set of labs this month

## 2014-09-25 NOTE — Assessment & Plan Note (Signed)
Bp at target on current regimen Update urine MA with next set of labs

## 2014-09-25 NOTE — Patient Instructions (Addendum)
Check sugars 4 x daily ( before each meal and at bedtime).  Record them in a log book and bring that/meter to next appointment.   Continue levemir at 25 units daily.  Change Humalog 18 Breakfast/20 units lunch/ 14 units supper  Labs fasting next week after 10/04/14  Please come back for a follow-up appointment in 3 weeks- Dr Dwyane Dee. Take half dose of levemir night prior to Urology procedure.

## 2014-09-25 NOTE — Progress Notes (Signed)
Reason for visit-  Shawn [name]. is a 81 y.o.-year-old male, for management of Type 2 diabetes, uncontrolled, with complications ( stage 3 CKD, neuropathy). Associated hx of macrovascular disease (CAD,CHF and stroke). Here with wife, who administers insulin. Last visit May 2016.  HPI- Patient has been diagnosed with diabetes in 23-25 years~age 89s. Recalls being initially on lifestyle modifications.  Tried  Metformin, Glimeperide, Januvia. he has not been insulin since 2016.    *Off metformin since jan 2016>> got started on levemir after recent hospitalization *Now back on januvia after being off of it >> Back on 100 mg daily for several months   * On prednsione since 8-9 months , now on  5 mg alterating with 10 mg daily >> fu in few weeks after Urology procedure. He is supposed to be on 5 mg daily but that caused increased symptoms and he self increased dose ( sees Shawn [name], pulm)  * multiple yeast and UTI recently, recd temporary ureteral stent Feb 2016 after episode of urinary blockage, reports that that stent was infected and had a permanent one put in April 4th. Healing well.  Recent ER visit for UTI, now finished another course of abx Plan is for him to be operated on in end June for non obstructing kidney stone>>scheduled for June 29th   Pt is currently on a regimen of: - januvia 100 mg daily - Levemir 25  units qhs -Humalog 18/18/14     Last hemoglobin A1c was: Lab Results  Component Value Date   HGBA1C 10.8* 07/04/2014   HGBA1C 9.6* 02/15/2014   HGBA1C 7.9* 06/22/2013     Pt checks his sugars 4 a day . Uses ? glucometer. By log they are:  PREMEAL Breakfast Lunch Dinner Bedtime Overall  Glucose range: 88-194 117-204 175-240 119-260   Mean/median:        POST-MEAL PC Breakfast PC Lunch PC Dinner  Glucose range:     Mean/median:       Hypoglycemia-  No lows. Lowest sugar was n/a; he has hypoglycemia awareness at 70. In 2016, got down to 34 and needed EMS  assistance  Dietary habits- eats three times daily.  Normally with BF - eggs Lunch - soup/sandwich,/tuna/breast chicken/salad. Variable eating times. Sweet tea once weekly Supper- hold off on bread, sweet potatotes Exercise- recent stroke this Winter 2015, now with walker>>vison still blurred, home nurse weekly visits, OT, PT done Weight - lost 15 lbs in past 2-3 months>>now generally stable  Wt Readings from Last 3 Encounters:  09/25/14 197 lb 8 oz (89.585 kg)  09/20/14 198 lb 8 oz (90.039 kg)  09/18/14 200 lb (90.719 kg)    Diabetes Complications-  Nephropathy- Yes Stage 3  CKD, last BUN/creatinine-   Lab Results  Component Value Date   BUN 19 08/05/2014   CREATININE 1.20 08/05/2014   Lab Results  Component Value Date   GFR 53.57* 02/07/2014   No results found for: MICRALBCREAT      Retinopathy- No, Last [name] was in 2016 , no Shawn, got new glasses Neuropathy- has numbness and tingling in )his feet. Known neuropathy. On neurontin  Associated history - CAD s/p stents, CHF, afib . Prior stroke and recent stroke. No hypothyroidism. his last TSH was No results found for: TSH  Hyperlipidemia-  his last set of lipids were- Currently on Lipitor 40. Tolerating well.   Lab Results  Component Value Date   CHOL 109 06/22/2013   HDL 29.80* 06/22/2013   LDLCALC 41  06/22/2013   TRIG 192.0* 06/22/2013   CHOLHDL 4 06/22/2013    Blood Pressure/HTN- Patient's blood pressure is well controlled today on current regimen.    I have reviewed the patient's past medical history, medications and allergies.   Current Outpatient Prescriptions on File Prior to Visit  Medication Sig Dispense Refill  . acetaminophen (TYLENOL) 650 MG CR tablet Take 650 mg by mouth every 8 (eight) hours as needed.      Marland Kitchen albuterol (2.5 MG/3ML) 0.083% NEBU 3 mL, albuterol (5 MG/ML) 0.5% NEBU 0.5 mL Inhale 1 mg into the lungs.    Marland Kitchen albuterol (PROVENTIL HFA;VENTOLIN HFA) 108 (90 BASE) MCG/ACT inhaler Inhale 2 puffs  into the lungs every 6 (six) hours as needed.      . ALPRAZolam (XANAX) 0.25 MG tablet Take 1 tablet (0.25 mg total) by mouth at bedtime as needed for anxiety. 90 tablet 0  . apixaban (ELIQUIS) 5 MG TABS tablet Take 1 tablet (5 mg total) by mouth 2 (two) times daily. 180 tablet 3  . atorvastatin (LIPITOR) 40 MG tablet Take 40 mg by mouth daily.      . bimatoprost (LUMIGAN) 0.03 % ophthalmic solution Place 1 drop into both eyes at bedtime.     . budesonide-formoterol (SYMBICORT) 160-4.5 MCG/ACT inhaler Inhale 2 puffs into the lungs 2 (two) times daily.    . citalopram (CELEXA) 10 MG tablet Take 1 tablet (10 mg total) by mouth daily. 90 tablet 1  . finasteride (PROSCAR) 5 MG tablet Take 5 mg by mouth daily.    . furosemide (LASIX) 40 MG tablet Alternate '40mg'$  and '20mg'$  per day.  If your weight is up, then temporarily resume '40mg'$  each day. 90 tablet 3  . gabapentin (NEURONTIN) 100 MG capsule Take 1 capsule (100 mg total) by mouth 3 (three) times daily. 270 capsule 1  . HYDROcodone-acetaminophen (NORCO/VICODIN) 5-325 MG per tablet Take 1 tablet by mouth every 4 (four) hours as needed for moderate pain. 20 tablet 0  . insulin lispro (HUMALOG KWIKPEN) 100 UNIT/ML KiwkPen 11 units with breakfast, 11 units with with lunch and 9 units with supper (Patient taking differently: 18 units with breakfast, 18 units with with lunch and 14 units with supper) 11 pen 1  . Insulin Pen Needle 32G X 4 MM MISC Use three times daily for insulin admin 100 each 3  . Insulin Syringe-Needle U-100 30G X 1/2" 1 ML MISC 1 each by Does not apply route 3 (three) times daily. 200 each 5  . LEVEMIR 100 UNIT/ML injection INJECT 28 UNITS TOTAL UNDER THE SKIN AT BEDTIME. ADD 2 UNITS PER DAY UNTIL MORNING SUGAR IS LESS THAN 140 (Patient taking differently: INJECT 25 UNITS TOTAL UNDER THE SKIN AT BEDTIME. ADD 2 UNITS PER DAY UNTIL MORNING SUGAR IS LESS THAN 140) 3 vial 5  . metoprolol (TOPROL-XL) 50 MG 24 hr tablet Take 50 mg by mouth 2 (two)  times daily.     . nitroGLYCERIN (NITROSTAT) 0.4 MG SL tablet Place 0.4 mg under the tongue every 5 (five) minutes as needed.      . nystatin-triamcinolone (MYCOLOG II) cream Apply 1 application topically 2 (two) times daily.     . potassium chloride (K-DUR) 10 MEQ tablet Take 3 tablets (30 mEq total) by mouth daily. 270 tablet 3  . predniSONE (DELTASONE) 10 MG tablet '10mg'$  a day for 1 week, then back down to '5mg'$  a day. (Patient taking differently: 5 mg. 5 mg alt with 10 mg daily)    .  sitaGLIPtin (JANUVIA) 100 MG tablet Take 1 tablet (100 mg total) by mouth daily. 90 tablet 0  . tamsulosin (FLOMAX) 0.4 MG CAPS capsule TAKE 1 CAPSULE DAILY 90 capsule 1  . tiotropium (SPIRIVA) 18 MCG inhalation capsule Place 18 mcg into inhaler and inhale daily.       No current facility-administered medications on file prior to visit.   Allergies  Allergen Reactions  . Contrast Media [Iodinated Diagnostic Agents] Shortness Of Breath  . Morphine And Related Other (See Comments)    Hallucinations   . Niacin And Related Dermatitis     Review of Systems- [ x ]  Complains of    [  ]  denies [  ] Recent weight change [  ]  Fatigue [  ] polydipsia [  ] polyuria  [  ]  vision difficulty [  ] chest pain [  ] shortness of breath  [  ] cough [  ] nausea/vomiting [  ] diarrhea [  ] constipation [  ] abdominal pain [  ]  tingling/numbness in extremities [  ]  concern with feet ( wounds/sores)   PE: BP 116/70 mmHg  Pulse 75  Resp 14  Ht '5\' 6"'$  (1.676 m)  Wt 197 lb 8 oz (89.585 kg)  BMI 31.89 kg/m2  SpO2 95% Wt Readings from Last 3 Encounters:  09/25/14 197 lb 8 oz (89.585 kg)  09/20/14 198 lb 8 oz (90.039 kg)  09/18/14 200 lb (90.719 kg)   Exam: deferred  ASSESSMENT AND PLAN: Problem List Items Addressed This Visit      Cardiovascular and Mediastinum   Essential hypertension    Bp at target on current regimen Update urine MA with next set of labs            Relevant Orders   Hemoglobin A1c    Comprehensive metabolic panel   Lipid panel   Microalbumin / creatinine urine ratio     Endocrine   Type 2 diabetes mellitus with hyperglycemia - Primary    Goal A1c would be closer to 8% given his age and comorbidities.   His sugars are improving after recent insulin adjustments ( made in between clinic appts) and after infected stent removal. Insulin requirements will be likely be different after Urology procedure and with decreasing steroid doses.  Continue levemir at 25 units daily at bedtime. Change humalog to 18 BF/20 Lunch/14 supper Call us if start to notice uncontrolled sugars.   Continue Januvia for now, but will probably consider weaning it off in the next few visits. Labs after 10/04/14 for A1c             Relevant Orders   Hemoglobin A1c   Comprehensive metabolic panel   Lipid panel   Microalbumin / creatinine urine ratio     Other   Hyperlipidemia    Managed by cardiology. Last LDL in 2015 was at goal on current therapy  Update with next set of labs this month      Relevant Orders   Hemoglobin A1c   Comprehensive metabolic panel   Lipid panel   Microalbumin / creatinine urine ratio      - Return to clinic in 3 weeks with sugar log/meter. Explained that [place] am [place] out of [place], and discussed follow up care. Elected to follow up with Shawn [name]. Needs a sooner follow up due to anticipated Urological procedure.  Ina Scrivens [facility] 09/25/2014 2:13 PM

## 2014-09-25 NOTE — Assessment & Plan Note (Signed)
Goal A1c would be closer to 8% given his age and comorbidities.   His sugars are improving after recent insulin adjustments ( made in between clinic appts) and after infected stent removal. Insulin requirements will be likely be different after Urology procedure and with decreasing steroid doses.  Continue levemir at 25 units daily at bedtime. Change humalog to 18 BF/20 Lunch/14 supper Call us if start to notice uncontrolled sugars.   Continue Januvia for now, but will probably consider weaning it off in the next few visits. Labs after 10/04/14 for A1c

## 2014-09-27 LAB — CULTURE, URINE COMPREHENSIVE

## 2014-09-27 LAB — ANTIFUNGAL SUSCEP 4 DRUGS
Amphotericin B MIC: 1
Ketoconazole MIC: 0.25
Voriconazole MIC: 0.5

## 2014-09-27 LAB — ANTIFUNGAL SUSCEP, FLUCONAZOLE

## 2014-09-27 LAB — ORGANISM IDENTIFICATION, YEAST

## 2014-09-30 ENCOUNTER — Other Ambulatory Visit: Payer: Self-pay | Admitting: Internal Medicine

## 2014-09-30 ENCOUNTER — Telehealth: Payer: Self-pay | Admitting: Internal Medicine

## 2014-09-30 DIAGNOSIS — E1165 Type 2 diabetes mellitus with hyperglycemia: Secondary | ICD-10-CM

## 2014-09-30 NOTE — Telephone Encounter (Signed)
Referral placed.

## 2014-09-30 NOTE — Telephone Encounter (Signed)
Pt wife called stating [name] needs new referral to see an endocrinologist since Dr. Howell Rucks is leaving. He is currently schedule to see Dr. Dwyane Dee in July but pt does not want to go to [facility].   They request to see Dr. Lavone Orn.   [phone]

## 2014-10-01 ENCOUNTER — Encounter
Admission: RE | Admit: 2014-10-01 | Discharge: 2014-10-01 | Disposition: A | Discharge status: Home / Self Care | Payer: Medicare Other | Source: Ambulatory Visit | Attending: Urology | Admitting: Urology

## 2014-10-01 DIAGNOSIS — I10 Essential (primary) hypertension: Secondary | ICD-10-CM | POA: Diagnosis not present

## 2014-10-01 DIAGNOSIS — I2510 Atherosclerotic heart disease of native coronary artery without angina pectoris: Secondary | ICD-10-CM | POA: Insufficient documentation

## 2014-10-01 DIAGNOSIS — J449 Chronic obstructive pulmonary disease, unspecified: Secondary | ICD-10-CM | POA: Diagnosis not present

## 2014-10-01 DIAGNOSIS — E785 Hyperlipidemia, unspecified: Secondary | ICD-10-CM | POA: Diagnosis not present

## 2014-10-01 DIAGNOSIS — I509 Heart failure, unspecified: Secondary | ICD-10-CM | POA: Insufficient documentation

## 2014-10-01 DIAGNOSIS — I499 Cardiac arrhythmia, unspecified: Secondary | ICD-10-CM | POA: Diagnosis not present

## 2014-10-01 DIAGNOSIS — E119 Type 2 diabetes mellitus without complications: Secondary | ICD-10-CM | POA: Insufficient documentation

## 2014-10-01 DIAGNOSIS — Z01812 Encounter for preprocedural laboratory examination: Secondary | ICD-10-CM | POA: Insufficient documentation

## 2014-10-01 DIAGNOSIS — R296 Repeated falls: Secondary | ICD-10-CM | POA: Insufficient documentation

## 2014-10-01 DIAGNOSIS — N200 Calculus of kidney: Secondary | ICD-10-CM | POA: Diagnosis present

## 2014-10-01 DIAGNOSIS — I4891 Unspecified atrial fibrillation: Secondary | ICD-10-CM | POA: Diagnosis not present

## 2014-10-01 DIAGNOSIS — I6930 Unspecified sequelae of cerebral infarction: Secondary | ICD-10-CM | POA: Diagnosis not present

## 2014-10-01 HISTORY — DX: Repeated falls: R29.6

## 2014-10-01 HISTORY — DX: Other abnormalities of gait and mobility: R26.89

## 2014-10-01 HISTORY — DX: Unspecified fall, initial encounter: W19.XXXA

## 2014-10-01 LAB — BASIC METABOLIC PANEL
ANION GAP: 8 (ref 5–15)
BUN: 28 mg/dL — AB (ref 6–20)
CALCIUM: 9 mg/dL (ref 8.9–10.3)
CO2: 29 mmol/L (ref 22–32)
CREATININE: 1.39 mg/dL — AB (ref 0.61–1.24)
Chloride: 106 mmol/L (ref 101–111)
GFR calc Af Amer: 54 mL/min — ABNORMAL LOW (ref >60)
GFR calc non Af Amer: 46 mL/min — ABNORMAL LOW (ref >60)
Glucose, Bld: 82 mg/dL (ref 65–99)
Potassium: 4 mmol/L (ref 3.5–5.1)
Sodium: 143 mmol/L (ref 135–145)

## 2014-10-01 LAB — CBC
HCT: 42.3 % (ref 40.0–52.0)
Hemoglobin: 13.5 g/dL (ref 13.0–18.0)
MCH: 26.6 pg (ref 26.0–34.0)
MCHC: 31.9 g/dL — ABNORMAL LOW (ref 32.0–36.0)
MCV: 83.3 fL (ref 80.0–100.0)
Platelets: 260 10*3/uL (ref 150–440)
RBC: 5.07 MIL/uL (ref 4.40–5.90)
RDW: 16.4 % — ABNORMAL HIGH (ref 11.5–14.5)
WBC: 19 10*3/uL — ABNORMAL HIGH (ref 3.8–10.6)

## 2014-10-01 NOTE — OR Nursing (Signed)
Need orders and H&P office notified.

## 2014-10-01 NOTE — Patient Instructions (Addendum)
  Your procedure is scheduled on: 10/09/14 Wed Report to Day Surgery. To find out your arrival time please call [phone] between 1PM - 3PM on 10/08/14 Tues.  Remember: Instructions that are not followed completely may result in serious medical risk, up to and including death, or upon the discretion of your surgeon and anesthesiologist your surgery may need to be rescheduled.    _x___ 1. Do not eat food or drink liquids after midnight. No gum chewing or hard candies.     ____ 2. No Alcohol for 24 hours before or after surgery.   ____ 3. Bring all medications with you on the day of surgery if instructed.    __x__ 4. Notify your doctor if there is any change in your medical condition     (cold, fever, infections).     Do not wear jewelry, make-up, hairpins, clips or nail polish.  Do not wear lotions, powders, or perfumes. You may wear deodorant.  Do not shave 48 hours prior to surgery. Men may shave face and neck.  Do not bring valuables to the hospital.    [facility] is not responsible for any belongings or valuables.               Contacts, dentures or bridgework may not be worn into surgery.  Leave your suitcase in the car. After surgery it may be brought to your room.  For patients admitted to the hospital, discharge time is determined by your                treatment team.   Patients discharged the day of surgery will not be allowed to drive home.   Please read over the following fact sheets that you were given:      ____ Take these medicines the morning of surgery with A SIP OF WATER:    1. albuterol (2.5 MG/3ML) 0.083% NEBU 3 mL, albuterol (5 MG/ML) 0.5% NEBU 0.5 mL  2. budesonide-formoterol (SYMBICORT) 160-4.5 MCG/ACT inhaler  3. citalopram (CELEXA) 10 MG tablet  4.metoprolol (TOPROL-XL) 50 MG 24 hr tablet  5.predniSONE (DELTASONE) 10 MG tablet  6.  ____ Fleet Enema (as directed)   __x__ Use CHG Soap as directed  __x__ Use inhalers on the day of surgery tiotropium  (SPIRIVA) 18 MCG inhalation capsule  ____ Stop metformin 2 days prior to surgery    __x__ Take 1/2 of usual insulin dose the night before surgery and none on the morning of surgery.   __x__ Stop Coumadin/Plavix/aspirin on 2 days before surgery (apixaban (ELIQUIS) 5 MG TABS tablet)  ____ Stop Anti-inflammatories on    __x__ Stop supplements until after surgery.  Stop fish oil until after surgery  _x___ Bring C-Pap to the hospital.

## 2014-10-01 NOTE — Telephone Encounter (Signed)
10/01/14- spoke to [facility] and Dr. Joycie Peek new patient appointment are not until the end of Aug 2016. Called pt and spoke to him about the situation and pt cannot wait that long. He is going to keep the appt with Dr. Dwyane Dee in [place]. Referral cancelled

## 2014-10-02 ENCOUNTER — Telehealth: Payer: Self-pay | Admitting: Internal Medicine

## 2014-10-02 ENCOUNTER — Telehealth: Payer: Self-pay | Admitting: Urology

## 2014-10-02 NOTE — Telephone Encounter (Signed)
Per Dr. Erlene Quan patient's wife was notified that we do have culture results and we will be treating with abx the day of surgery and post operatively. We are using the culture results on a basis for this treatment.

## 2014-10-03 ENCOUNTER — Telehealth: Payer: Self-pay

## 2014-10-03 NOTE — Telephone Encounter (Signed)
Spoke with pts wife.  Advised her the labs ordered by the Anesthesiologist is not the labs ordered by Dr Howell Rucks.  She verbalized understanding

## 2014-10-03 NOTE — Telephone Encounter (Signed)
The pt's wife called hoping to find out if the patient still needed to do this lab work due to Eek leaving.

## 2014-10-04 ENCOUNTER — Other Ambulatory Visit (INDEPENDENT_AMBULATORY_CARE_PROVIDER_SITE_OTHER): Payer: Medicare Other

## 2014-10-04 DIAGNOSIS — E785 Hyperlipidemia, unspecified: Secondary | ICD-10-CM

## 2014-10-04 DIAGNOSIS — E1165 Type 2 diabetes mellitus with hyperglycemia: Secondary | ICD-10-CM

## 2014-10-04 DIAGNOSIS — I10 Essential (primary) hypertension: Secondary | ICD-10-CM

## 2014-10-04 LAB — MICROALBUMIN / CREATININE URINE RATIO
Creatinine,U: 115.9 mg/dL
MICROALB/CREAT RATIO: 35.4 mg/g — AB (ref 0.0–30.0)
Microalb, Ur: 41 mg/dL — ABNORMAL HIGH (ref 0.0–1.9)

## 2014-10-04 LAB — COMPREHENSIVE METABOLIC PANEL
ALBUMIN: 3.6 g/dL (ref 3.5–5.2)
ALK PHOS: 76 U/L (ref 39–117)
ALT: 12 U/L (ref 0–53)
AST: 12 U/L (ref 0–37)
BILIRUBIN TOTAL: 0.6 mg/dL (ref 0.2–1.2)
BUN: 20 mg/dL (ref 6–23)
CO2: 32 mEq/L (ref 19–32)
Calcium: 9.2 mg/dL (ref 8.4–10.5)
Chloride: 105 mEq/L (ref 96–112)
Creatinine, Ser: 1.26 mg/dL (ref 0.40–1.50)
GFR: 58.41 mL/min — ABNORMAL LOW (ref >60.00)
GLUCOSE: 89 mg/dL (ref 70–99)
POTASSIUM: 3.9 meq/L (ref 3.5–5.1)
SODIUM: 140 meq/L (ref 135–145)
TOTAL PROTEIN: 6.8 g/dL (ref 6.0–8.3)

## 2014-10-04 LAB — LIPID PANEL
CHOLESTEROL: 107 mg/dL (ref 0–200)
HDL: 43.1 mg/dL (ref >39.00)
LDL CALC: 47 mg/dL (ref 0–99)
NonHDL: 63.9
Total CHOL/HDL Ratio: 2
Triglycerides: 83 mg/dL (ref 0.0–149.0)
VLDL: 16.6 mg/dL (ref 0.0–40.0)

## 2014-10-04 LAB — HEMOGLOBIN A1C: HEMOGLOBIN A1C: 8.3 % — AB (ref 4.6–6.5)

## 2014-10-05 NOTE — Telephone Encounter (Signed)
Please refer to my note from 09/25/14, I had entered labs to be done after 6/24. If some of them are duplicate with the ones ordered by anesthesia, then ok to do  Them once only. Main lab for me is the A1c. thanks

## 2014-10-05 NOTE — Telephone Encounter (Signed)
Labs reviewed- see result annotation

## 2014-10-08 LAB — AEROBIC ID BY MALDI

## 2014-10-08 LAB — AEROBIC BACTERIA, ID BY SEQ.

## 2014-10-08 LAB — ORGANISM ID BY MALDI

## 2014-10-08 LAB — ORGANISM ID BY SEQUENCING

## 2014-10-08 NOTE — H&P (Signed)
09/20/2014 8:45 AM   [name]. 1931-12-23 [id number]  Referring provider: Jearld Fenton, NP [address] [place], [place] [address]  Chief Complaint  Patient presents with  . Nephrolithiasis    HPI: 81 yo M with severe medical comorbidities including CHF, h/o CVA, COPD, DM who with recurrent bacterial and candidial sepsis since 01/2014, urinary retention, phimosis s/p office circ 04/2014, and most recently emergent left ureteral stent placement on 04/24/14 in the setting of sepsis for obstucting ureteral stone. Most recently, his ureteral stent was exchanged on 07/15/14 but the stone was not treated based on his poor functional status at the time and concern for recurrent sepsis which he would not tolerate well.   He has been admitted and discharged several times since that admission for various issues including recent stroke earlier this year. He initially did have some neurological deficits but these have resolved for the most part.  Most recent cross sectional imaging reviewed shows CT abd/ pelvis with large left non obstructing stone burden, left ureteral stone with stent on good position, enlarged prostate.   He tolerated anesthesia during his most recent stent exchange and has done well postop. He's had no further infections. He reports that overall, he is in the best health that he has been in the past year. No recent fevers or chills. He does occasionally have burning when he voids. His blood sugar has also dramatically improved over the past month.   He returns to the office today to discuss definitive management of his ureteral stone and hopefully ultimately remove his stent. He does still have a significant left upper tract stone burden.   PMH: Past Medical History  Diagnosis Date  . MI     a. tx'd w/ TPA at [facility] 01/29/1996  . HYPERTENSION   . HYPERLIPIDEMIA   . CAD     a. Myoview 06/2005: EF 50%, scar @ apex, mild  peri-infarct ischemia  . Chronic atrial fibrillation     a. since 2006; b. on warfarin  . DM   . History of kidney stones   . Neuropathy of both feet   . Chronic diastolic CHF (congestive heart failure)     a. echo 04/2006: EF lower limits of nl, mod LVH, mild aortic root dilatation, & mild MR, biatrial enlargement; b. echo 04/2013: EF 60%, mod dilated LA, mild MR & TR, mod pulm HTN w/ RV systolic pressure 53, c. echo 04/21/14: EF 55-60%, unable to exclude WMA, severely dilated LA 6.6 cm, nl RVSP, mildly dilated aortic root  . Kidney stone     a. s/p left ureteral stenting 04/24/14  . Arrhythmia   . COPD (chronic obstructive pulmonary disease)   . CVA [id number]  . GERD (gastroesophageal reflux disease)   . Cancer     skin  . Arthritis     Surgical History: Past Surgical History  Procedure Laterality Date  . Tonsillectomy and adenoidectomy  1959  . Carotid stent insertion  1997  . Cardiac catheterization  1997    [facility]  . Coronary angioplasty  1997    s/p stent placement x 2   . Kidney surgery  05/2013    s/p stent placement   . Bladder surgery      stent placement   . Circumcision  2016    Home Medications:    Medication List       This list is accurate as of: 09/20/14 11:59 PM. Always use your most recent med  list.              acetaminophen 650 MG CR tablet  Commonly known as: TYLENOL  Take 650 mg by mouth every 8 (eight) hours as needed.     albuterol (2.5 MG/3ML) 0.083% NEBU 3 mL, albuterol (5 MG/ML) 0.5% NEBU 0.5 mL  Inhale 1 mg into the lungs.     albuterol 108 (90 BASE) MCG/ACT inhaler  Commonly known as: PROVENTIL HFA;VENTOLIN HFA  Inhale 2 puffs into the lungs every 6 (six) hours as needed.     ALPRAZolam 0.25 MG tablet  Commonly known as: XANAX  Take 1 tablet (0.25 mg total) by mouth at bedtime as needed for anxiety.      apixaban 5 MG Tabs tablet  Commonly known as: ELIQUIS  Take 1 tablet (5 mg total) by mouth 2 (two) times daily.     atorvastatin 40 MG tablet  Commonly known as: LIPITOR  Take 40 mg by mouth daily.     bimatoprost 0.03 % ophthalmic solution  Commonly known as: LUMIGAN  Place 1 drop into both eyes at bedtime.     budesonide-formoterol 160-4.5 MCG/ACT inhaler  Commonly known as: SYMBICORT  Inhale 2 puffs into the lungs 2 (two) times daily.     citalopram 10 MG tablet  Commonly known as: CELEXA  Take 1 tablet (10 mg total) by mouth daily.     finasteride 5 MG tablet  Commonly known as: PROSCAR  Take 5 mg by mouth daily.     fluconazole 200 MG tablet  Commonly known as: DIFLUCAN  Take 1 tablet by mouth daily.     furosemide 40 MG tablet  Commonly known as: LASIX  Alternate '40mg'$  and '20mg'$  per day. If your weight is up, then temporarily resume '40mg'$  each day.     gabapentin 100 MG capsule  Commonly known as: NEURONTIN  Take 1 capsule (100 mg total) by mouth 3 (three) times daily.     HYDROcodone-acetaminophen 5-325 MG per tablet  Commonly known as: NORCO/VICODIN  Take 1 tablet by mouth every 4 (four) hours as needed for moderate pain.     insulin lispro 100 UNIT/ML KiwkPen  Commonly known as: HUMALOG KWIKPEN  11 units with breakfast, 11 units with with lunch and 9 units with supper     Insulin Pen Needle 32G X 4 MM Misc  Use three times daily for insulin admin     Insulin Syringe-Needle U-100 30G X 1/2" 1 ML Misc  1 each by Does not apply route 3 (three) times daily.     LEVEMIR 100 UNIT/ML injection  Generic drug: insulin detemir  INJECT 28 UNITS TOTAL UNDER THE SKIN AT BEDTIME. ADD 2 UNITS PER DAY UNTIL MORNING SUGAR IS LESS THAN 140     metoprolol succinate 50 MG 24 hr tablet  Commonly known as: TOPROL-XL  Take 50 mg by mouth 2 (two) times daily.     nitroGLYCERIN 0.4 MG SL  tablet  Commonly known as: NITROSTAT  Place 0.4 mg under the tongue every 5 (five) minutes as needed.     nystatin-triamcinolone cream  Commonly known as: MYCOLOG II  Apply 1 application topically 2 (two) times daily.     potassium chloride 10 MEQ tablet  Commonly known as: K-DUR  Take 3 tablets (30 mEq total) by mouth daily.     predniSONE 10 MG tablet  Commonly known as: DELTASONE  '10mg'$  a day for 1 week, then back down to '5mg'$  a day.  sitaGLIPtin 100 MG tablet  Commonly known as: JANUVIA  Take 1 tablet (100 mg total) by mouth daily.     tamsulosin 0.4 MG Caps capsule  Commonly known as: FLOMAX  TAKE 1 CAPSULE DAILY     tiotropium 18 MCG inhalation capsule  Commonly known as: SPIRIVA  Place 18 mcg into inhaler and inhale daily.        Allergies:  Allergies  Allergen Reactions  . Contrast Media [Iodinated Diagnostic Agents] Shortness Of Breath  . Morphine And Related Other (See Comments)    Hallucinations   . Niacin And Related Dermatitis    Family History: Family History  Problem Relation Age of Onset  . Heart disease Maternal Grandmother   . Diabetes Maternal Grandmother   . Cancer Neg Hx   . Stroke Neg Hx   . Heart disease Mother     Social History:  reports that he quit smoking about 24 years ago. His smoking use included Cigarettes. He has a 80 pack-year smoking history. He quit smokeless tobacco use about 24 years ago. He reports that he does not drink alcohol or use illicit drugs.  Physical Exam: BP 104/60 mmHg  Pulse 53  Ht '5\' 6"'$  (1.676 m)  Wt 198 lb 8 oz (90.039 kg)  BMI 32.05 kg/m2  Constitutional: Alert and oriented, No acute distress. Obese, presents to office today with his wife. HEENT: [place] AT, moist mucus membranes. Trachea midline, no masses. Cardiovascular: No clubbing, cyanosis, or edema. RRR. Respiratory: Normal respiratory effort, no increased work of  breathing. CTAB. GI: Abdomen is soft, nontender, nondistended, no abdominal masses GU: No CVA tenderness.  Skin: No rashes, bruises or suspicious lesions. Neurologic: Grossly intact, no focal deficits, moving all 4 extremities. Psychiatric: Normal mood and affect.  Laboratory Data:  Recent Labs    Lab Results  Component Value Date   WBC 17.2* 08/05/2014   HGB 13.3 08/05/2014   HCT 40.1 08/05/2014   MCV 83 08/05/2014   PLT 246 08/05/2014       Recent Labs    Lab Results  Component Value Date   CREATININE 1.20 08/05/2014       Recent Labs    Lab Results  Component Value Date   PSA 4.1* 04/24/2013       Recent Labs    No results found for: TESTOSTERONE     Recent Labs    Lab Results  Component Value Date   HGBA1C 10.8* 07/04/2014      Urinalysis Results for orders placed or performed in visit on 09/20/14  CULTURE, URINE COMPREHENSIVE  Result Value Ref Range   Urine Culture, Comprehensive Final report (A)    Result 1 Yeast isolated. (A)    Result 2 Comment (A)    Result 3 Lactobacillus species   Microscopic Examination  Result Value Ref Range   WBC, UA >30 (A) 0 - 5 /hpf   RBC, UA 3-10 (A) 0 - 2 /hpf   Epithelial Cells (non renal) 0-10 0 - 10 /hpf   Bacteria, UA Few None seen/Few   Yeast, UA Present (A) None seen  Organism Identification, Yeast  Result Value Ref Range   Organism Identification, Yeast Final report    RESULT 1 Candida glabrata   Susceptibility, Aer + Anaerob  Result Value Ref Range   Suscept Result 1 Comment   Urinalysis, Complete  Result Value Ref Range   Specific Gravity, UA 1.020 1.005 - 1.030   pH, UA 5.5 5.0 - 7.5  Color, UA Yellow Yellow   Appearance Ur Cloudy (A) Clear   Leukocytes, UA 2+ (A) Negative   Protein, UA 2+ (A) Negative/Trace   Glucose, UA Negative Negative    Ketones, UA Negative Negative   RBC, UA 2+ (A) Negative   Bilirubin, UA Negative Negative   Urobilinogen, Ur 0.2 0.2 - 1.0 mg/dL   Nitrite, UA Negative Negative   Microscopic Examination See below:   Specimen status report  Result Value Ref Range   specimen status report Comment     Pertinent Imaging: PROCEDURE: CT - CT ABDOMEN /PELVIS WO (STONE) - Jun 03 2014 3:58PM IMPRESSION: Interval placement of left-sided ureteral stent in grossly good position. Continued presence of large nonobstructive calculus in lower pole collecting system of left kidney. Nonobstructive 5 mm calculus seen in left renal pelvis. No hydronephrosis or renal obstruction is noted on the right. Cholelithiasis is noted. Mild wall thickening and surrounding inflammation is seen involving the urinary bladder concerning for possible cystitis. 8.5 mm nodule is noted posteriorly in the left lower lobe.  Assessment & Plan: 81 year-old male with multiple comorbidities, poor health status post recent stroke and multiple episodes of urinary sepsis status post LEFT ureteral stent placement on 04/24/2014 for an obstructing ureteral stone and stent exchange on 07/15/14. Definitive management for this stone has been delayed due to recent stroke and other severe medical comorbidities.  1. Nephrolithiasis We discussed today proceeding with ureteroscopy to treat his left ureteral stone. Given his medical status, I do not feel that he would tolerate treatment of his upper tract stones with prolonged stone manipulation and risk of sepsis. Ideally, I'd like to clears ureter of stone and rid him of his stent in order to reduce the risk of recurrent urinary tract infections. Risks and benefits of ureteroscopy were reviewed including but not limited to infection, bleeding, pain, ureteral injury which could require open surgery versus prolonged indwelling if ureteralperforation occurs, persistent stone  disease, requirement for staged procedure, possible stent, and global anesthesia risks. Patient expressed understanding and desires to proceed with ureteroscopy. He is previously cleared for anesthesia although somewhat high risk. We again today discuss the increased risk of sepsis given his overall medical health and colonization with bacteria. - Urinalysis, Complete - CULTURE, URINE COMPREHENSIVE  2. BPH (benign prostatic hyperplasia) Continue Flomax/ finasteride.  3. Recurrent UTI Chrronically colonized, routinely grows yeast. We will go ahead and send off a UA/urine culture today for identity of the yeast strain as well as sensitivities. He is clinically well today therefore would not treat this infection that may reserve it if he spikes another fever, or develops any other urinary or generalized symptoms. Patient and his wife were made aware of this plan and will call the office if he starts to feel unwell. - We'll send UA/urine culture but not treat unless become symptomatic -Plan for double coverage IV abx + antifungal in OR to reduce risk of sepsis with stone manipulation   Hollice Espy, MD  [place] [address] [place],  [address] [phone]

## 2014-10-09 ENCOUNTER — Inpatient Hospital Stay
Admission: EM | Admit: 2014-10-09 | Discharge: 2014-10-11 | DRG: 668 | Disposition: A | Discharge status: Home / Self Care | Payer: Medicare Other | Attending: Internal Medicine | Admitting: Internal Medicine

## 2014-10-09 ENCOUNTER — Ambulatory Visit: Payer: Medicare Other | Admitting: *Deleted

## 2014-10-09 ENCOUNTER — Emergency Department: Payer: Medicare Other

## 2014-10-09 ENCOUNTER — Encounter
Admission: RE | Disposition: A | Discharge status: Home / Self Care | Payer: Self-pay | Source: Ambulatory Visit | Attending: Urology

## 2014-10-09 ENCOUNTER — Encounter: Payer: Self-pay | Admitting: *Deleted

## 2014-10-09 ENCOUNTER — Ambulatory Visit
Admission: RE | Admit: 2014-10-09 | Discharge: 2014-10-09 | Disposition: A | Discharge status: Home / Self Care | Payer: Medicare Other | Source: Ambulatory Visit | Attending: Urology | Admitting: Urology

## 2014-10-09 DIAGNOSIS — E785 Hyperlipidemia, unspecified: Secondary | ICD-10-CM | POA: Diagnosis present

## 2014-10-09 DIAGNOSIS — K219 Gastro-esophageal reflux disease without esophagitis: Secondary | ICD-10-CM | POA: Diagnosis present

## 2014-10-09 DIAGNOSIS — R0600 Dyspnea, unspecified: Secondary | ICD-10-CM

## 2014-10-09 DIAGNOSIS — A419 Sepsis, unspecified organism: Secondary | ICD-10-CM | POA: Diagnosis present

## 2014-10-09 DIAGNOSIS — J8410 Pulmonary fibrosis, unspecified: Secondary | ICD-10-CM | POA: Diagnosis present

## 2014-10-09 DIAGNOSIS — I482 Chronic atrial fibrillation: Secondary | ICD-10-CM | POA: Diagnosis present

## 2014-10-09 DIAGNOSIS — R0902 Hypoxemia: Secondary | ICD-10-CM | POA: Diagnosis present

## 2014-10-09 DIAGNOSIS — J849 Interstitial pulmonary disease, unspecified: Secondary | ICD-10-CM | POA: Diagnosis present

## 2014-10-09 DIAGNOSIS — I4891 Unspecified atrial fibrillation: Secondary | ICD-10-CM

## 2014-10-09 DIAGNOSIS — Z9981 Dependence on supplemental oxygen: Secondary | ICD-10-CM

## 2014-10-09 DIAGNOSIS — Z87440 Personal history of urinary (tract) infections: Secondary | ICD-10-CM

## 2014-10-09 DIAGNOSIS — Z8673 Personal history of transient ischemic attack (TIA), and cerebral infarction without residual deficits: Secondary | ICD-10-CM

## 2014-10-09 DIAGNOSIS — I509 Heart failure, unspecified: Secondary | ICD-10-CM

## 2014-10-09 DIAGNOSIS — Z885 Allergy status to narcotic agent status: Secondary | ICD-10-CM

## 2014-10-09 DIAGNOSIS — N201 Calculus of ureter: Secondary | ICD-10-CM | POA: Diagnosis not present

## 2014-10-09 DIAGNOSIS — I2510 Atherosclerotic heart disease of native coronary artery without angina pectoris: Secondary | ICD-10-CM | POA: Diagnosis present

## 2014-10-09 DIAGNOSIS — I10 Essential (primary) hypertension: Secondary | ICD-10-CM | POA: Diagnosis present

## 2014-10-09 DIAGNOSIS — I5042 Chronic combined systolic (congestive) and diastolic (congestive) heart failure: Secondary | ICD-10-CM | POA: Diagnosis present

## 2014-10-09 DIAGNOSIS — G629 Polyneuropathy, unspecified: Secondary | ICD-10-CM | POA: Diagnosis present

## 2014-10-09 DIAGNOSIS — T8359XA Infection and inflammatory reaction due to prosthetic device, implant and graft in urinary system, initial encounter: Principal | ICD-10-CM | POA: Diagnosis present

## 2014-10-09 DIAGNOSIS — I272 Other secondary pulmonary hypertension: Secondary | ICD-10-CM | POA: Diagnosis present

## 2014-10-09 DIAGNOSIS — I5032 Chronic diastolic (congestive) heart failure: Secondary | ICD-10-CM

## 2014-10-09 DIAGNOSIS — N202 Calculus of kidney with calculus of ureter: Secondary | ICD-10-CM | POA: Diagnosis present

## 2014-10-09 DIAGNOSIS — J449 Chronic obstructive pulmonary disease, unspecified: Secondary | ICD-10-CM | POA: Diagnosis present

## 2014-10-09 DIAGNOSIS — Z87891 Personal history of nicotine dependence: Secondary | ICD-10-CM

## 2014-10-09 HISTORY — PX: CYSTOSCOPY WITH STENT PLACEMENT: SHX5790

## 2014-10-09 HISTORY — PX: CYSTOSCOPY W/ URETERAL STENT REMOVAL: SHX1430

## 2014-10-09 HISTORY — PX: URETEROSCOPY WITH HOLMIUM LASER LITHOTRIPSY: SHX6645

## 2014-10-09 LAB — SUSCEPTIBILITY, GRAM POS RODS

## 2014-10-09 LAB — CBC WITH DIFFERENTIAL/PLATELET
BASOS PCT: 0 %
Basophils Absolute: 0.1 10*3/uL (ref 0–0.1)
EOS PCT: 0 %
Eosinophils Absolute: 0 10*3/uL (ref 0–0.7)
HEMATOCRIT: 45.2 % (ref 40.0–52.0)
Hemoglobin: 14.1 g/dL (ref 13.0–18.0)
LYMPHS ABS: 1.7 10*3/uL (ref 1.0–3.6)
Lymphocytes Relative: 8 %
MCH: 26.1 pg (ref 26.0–34.0)
MCHC: 31.2 g/dL — AB (ref 32.0–36.0)
MCV: 83.5 fL (ref 80.0–100.0)
MONO ABS: 2 10*3/uL — AB (ref 0.2–1.0)
MONOS PCT: 10 %
Neutro Abs: 17.1 10*3/uL — ABNORMAL HIGH (ref 1.4–6.5)
Neutrophils Relative %: 82 %
Platelets: 244 10*3/uL (ref 150–440)
RBC: 5.41 MIL/uL (ref 4.40–5.90)
RDW: 16.4 % — ABNORMAL HIGH (ref 11.5–14.5)
WBC: 21 10*3/uL — ABNORMAL HIGH (ref 3.8–10.6)

## 2014-10-09 LAB — PROTIME-INR
INR: 1.31
Prothrombin Time: 16.5 seconds — ABNORMAL HIGH (ref 11.4–15.0)

## 2014-10-09 LAB — CBC
HEMATOCRIT: 43.4 % (ref 40.0–52.0)
HEMOGLOBIN: 13.6 g/dL (ref 13.0–18.0)
MCH: 25.9 pg — ABNORMAL LOW (ref 26.0–34.0)
MCHC: 31.3 g/dL — ABNORMAL LOW (ref 32.0–36.0)
MCV: 82.7 fL (ref 80.0–100.0)
Platelets: 237 10*3/uL (ref 150–440)
RBC: 5.24 MIL/uL (ref 4.40–5.90)
RDW: 16.5 % — ABNORMAL HIGH (ref 11.5–14.5)
WBC: 14.1 10*3/uL — AB (ref 3.8–10.6)

## 2014-10-09 LAB — COMPREHENSIVE METABOLIC PANEL
ALT: 13 U/L — ABNORMAL LOW (ref 17–63)
AST: 15 U/L (ref 15–41)
Albumin: 3.4 g/dL — ABNORMAL LOW (ref 3.5–5.0)
Alkaline Phosphatase: 83 U/L (ref 38–126)
Anion gap: 8 (ref 5–15)
BUN: 22 mg/dL — ABNORMAL HIGH (ref 6–20)
CALCIUM: 8.9 mg/dL (ref 8.9–10.3)
CO2: 30 mmol/L (ref 22–32)
CREATININE: 1.33 mg/dL — AB (ref 0.61–1.24)
Chloride: 106 mmol/L (ref 101–111)
GFR, EST AFRICAN AMERICAN: 57 mL/min — AB (ref >60)
GFR, EST NON AFRICAN AMERICAN: 49 mL/min — AB (ref >60)
GLUCOSE: 122 mg/dL — AB (ref 65–99)
Potassium: 4.3 mmol/L (ref 3.5–5.1)
SODIUM: 144 mmol/L (ref 135–145)
Total Bilirubin: 0.7 mg/dL (ref 0.3–1.2)
Total Protein: 7 g/dL (ref 6.5–8.1)

## 2014-10-09 LAB — TROPONIN I: TROPONIN I: 0.03 ng/mL (ref <0.031)

## 2014-10-09 LAB — SUSCEPTIBILITY, AER + ANAEROB

## 2014-10-09 LAB — SPECIMEN STATUS REPORT

## 2014-10-09 LAB — GLUCOSE, CAPILLARY
Glucose-Capillary: 124 mg/dL — ABNORMAL HIGH (ref 65–99)
Glucose-Capillary: 201 mg/dL — ABNORMAL HIGH (ref 65–99)

## 2014-10-09 SURGERY — URETEROSCOPY, WITH LITHOTRIPSY USING HOLMIUM LASER
Anesthesia: General | Laterality: Left

## 2014-10-09 MED ORDER — FENTANYL CITRATE (PF) 100 MCG/2ML IJ SOLN
INTRAMUSCULAR | Status: DC | PRN
  Administered 2014-10-09 (×2): 50 ug via INTRAVENOUS

## 2014-10-09 MED ORDER — ALBUTEROL SULFATE (2.5 MG/3ML) 0.083% IN NEBU
2.5000 mg | INHALATION_SOLUTION | Freq: Four times a day (QID) | RESPIRATORY_TRACT | Status: DC | PRN
Start: 2014-10-09 — End: 2014-10-09
  Administered 2014-10-09: 2.5 mg via RESPIRATORY_TRACT

## 2014-10-09 MED ORDER — LIDOCAINE HCL (CARDIAC) 20 MG/ML IV SOLN
INTRAVENOUS | Status: DC | PRN
  Administered 2014-10-09: 30 mg via INTRAVENOUS

## 2014-10-09 MED ORDER — VORICONAZOLE 200 MG IV SOLR
4.0000 mg/kg | Freq: Once | INTRAVENOUS | Status: DC
  Filled 2014-10-09: qty 360

## 2014-10-09 MED ORDER — FENTANYL CITRATE (PF) 100 MCG/2ML IJ SOLN: 25.0000 ug | INTRAMUSCULAR | Status: DC | PRN

## 2014-10-09 MED ORDER — FAMOTIDINE 20 MG PO TABS
ORAL_TABLET | ORAL | Status: AC
  Administered 2014-10-09: 20 mg via ORAL
  Filled 2014-10-09: qty 1

## 2014-10-09 MED ORDER — PROPOFOL 10 MG/ML IV BOLUS
INTRAVENOUS | Status: DC | PRN
  Administered 2014-10-09: 150 mg via INTRAVENOUS

## 2014-10-09 MED ORDER — ALBUTEROL SULFATE (2.5 MG/3ML) 0.083% IN NEBU
INHALATION_SOLUTION | RESPIRATORY_TRACT | Status: AC
  Administered 2014-10-09: 2.5 mg via RESPIRATORY_TRACT
  Filled 2014-10-09: qty 3

## 2014-10-09 MED ORDER — FLUCONAZOLE 100 MG PO TABS: 100.0000 mg | ORAL_TABLET | Freq: Every day | ORAL | Status: DC

## 2014-10-09 MED ORDER — FAMOTIDINE 20 MG PO TABS
20.0000 mg | ORAL_TABLET | Freq: Once | ORAL | Status: AC
  Administered 2014-10-09: 20 mg via ORAL

## 2014-10-09 MED ORDER — SODIUM CHLORIDE 0.9 % IV SOLN
1.0000 g | Freq: Once | INTRAVENOUS | Status: DC
  Filled 2014-10-09: qty 1000

## 2014-10-09 MED ORDER — HYDROCODONE-ACETAMINOPHEN 5-325 MG PO TABS: 1.0000 | ORAL_TABLET | Freq: Four times a day (QID) | ORAL | Status: DC | PRN

## 2014-10-09 MED ORDER — DILTIAZEM HCL 25 MG/5ML IV SOLN
15.0000 mg | Freq: Once | INTRAVENOUS | Status: AC
  Administered 2014-10-10: 15 mg via INTRAVENOUS

## 2014-10-09 MED ORDER — ONDANSETRON HCL 4 MG/2ML IJ SOLN: 4.0000 mg | Freq: Once | INTRAMUSCULAR | Status: DC | PRN

## 2014-10-09 MED ORDER — GENTAMICIN IN SALINE 1.6-0.9 MG/ML-% IV SOLN
80.0000 mg | Freq: Once | INTRAVENOUS | Status: AC
  Administered 2014-10-09: 80 mg via INTRAVENOUS
  Filled 2014-10-09: qty 50

## 2014-10-09 MED ORDER — EPHEDRINE SULFATE 50 MG/ML IJ SOLN
INTRAMUSCULAR | Status: DC | PRN
  Administered 2014-10-09: 10 mg via INTRAVENOUS

## 2014-10-09 MED ORDER — SODIUM CHLORIDE 0.9 % IV SOLN
INTRAVENOUS | Status: DC
  Administered 2014-10-09 (×2): via INTRAVENOUS

## 2014-10-09 MED ORDER — ALBUTEROL SULFATE HFA 108 (90 BASE) MCG/ACT IN AERS
INHALATION_SPRAY | RESPIRATORY_TRACT | Status: DC | PRN
  Administered 2014-10-09 (×2): 2 via RESPIRATORY_TRACT

## 2014-10-09 MED ORDER — MIDAZOLAM HCL 2 MG/2ML IJ SOLN
INTRAMUSCULAR | Status: DC | PRN
  Administered 2014-10-09 (×2): 1 mg via INTRAVENOUS

## 2014-10-09 SURGICAL SUPPLY — 36 items
ADAPTER SCOPE UROLOK II (MISCELLANEOUS) ×2 IMPLANT
ADPR INSRT BALL FIT URLK2 (MISCELLANEOUS) ×1
BAG DRAIN CYSTO-URO LG1000N (MISCELLANEOUS) ×2 IMPLANT
BASKET ZERO TIP 1.9FR (BASKET) ×3 IMPLANT
BSKT STON RTRVL ZERO TP 1.9FR (BASKET) ×2
CATH URETL 5X70 OPEN END (CATHETERS) ×2 IMPLANT
CNTNR SPEC 2.5X3XGRAD LEK (MISCELLANEOUS) ×1
CONRAY 43 FOR UROLOGY 50M (MISCELLANEOUS) ×2 IMPLANT
CONT SPEC 4OZ STER OR WHT (MISCELLANEOUS) ×1
CONT SPEC 4OZ STRL OR WHT (MISCELLANEOUS) ×1
CONTAINER SPEC 2.5X3XGRAD LEK (MISCELLANEOUS) ×1 IMPLANT
GLOVE BIO SURGEON STRL SZ 6.5 (GLOVE) ×2 IMPLANT
GLOVE BIO SURGEON STRL SZ7 (GLOVE) ×4 IMPLANT
GOWN STRL REUS W/ TWL LRG LVL3 (GOWN DISPOSABLE) ×2 IMPLANT
GOWN STRL REUS W/TWL LRG LVL3 (GOWN DISPOSABLE) ×4
INTRODUCER DILATOR DOUBLE (INTRODUCER) ×2 IMPLANT
JELLY LUB 2OZ STRL (MISCELLANEOUS) ×1
JELLY LUBE 2OZ STRL (MISCELLANEOUS) ×1 IMPLANT
KIT RM TURNOVER CYSTO AR (KITS) ×2 IMPLANT
LASER HOLMIUM SU 200UM (MISCELLANEOUS) ×1 IMPLANT
LASER HOLMIUM SU 940UM (MISCELLANEOUS) ×1 IMPLANT
PACK CYSTO AR (MISCELLANEOUS) ×2 IMPLANT
PREP PVP WINGED SPONGE (MISCELLANEOUS) ×2 IMPLANT
PUMP SINGLE ACTION SAP (PUMP) ×2 IMPLANT
SENSORWIRE 0.038 NOT ANGLED (WIRE) ×4
SET AMPLATZ RENAL DILATOR (MISCELLANEOUS) ×1 IMPLANT
SET CYSTO W/LG BORE CLAMP LF (SET/KITS/TRAYS/PACK) ×2 IMPLANT
SHEATH URETERAL 12FRX35CM (MISCELLANEOUS) ×2 IMPLANT
SOL .9 NS 3000ML IRR  AL (IV SOLUTION) ×1
SOL .9 NS 3000ML IRR AL (IV SOLUTION) ×1
SOL .9 NS 3000ML IRR UROMATIC (IV SOLUTION) ×1 IMPLANT
STENT URET 6FRX24 CONTOUR (STENTS) ×1 IMPLANT
STENT URET 6FRX26 CONTOUR (STENTS) ×2 IMPLANT
SYRINGE IRR TOOMEY STRL 70CC (SYRINGE) ×2 IMPLANT
WATER STERILE IRR 1000ML POUR (IV SOLUTION) ×2 IMPLANT
WIRE SENSOR 0.038 NOT ANGLED (WIRE) ×2 IMPLANT

## 2014-10-09 NOTE — Brief Op Note (Signed)
10/09/2014  8:45 AM  PATIENT:  [name].  81 y.o. male  PRE-OPERATIVE DIAGNOSIS:  Left ureteral stone  POST-OPERATIVE DIAGNOSIS:  Left ureteral stone  PROCEDURE:  Procedure(s): URETEROSCOPY WITH HOLMIUM LASER LITHOTRIPSY (Left) CYSTOSCOPY WITH STENT REMOVAL (Left) CYSTOSCOPY WITH STENT PLACEMENT (Left)  SURGEON:  Surgeon(s) and Role:    * Hollice Espy, MD - Primary  ASSISTANTS: none   ANESTHESIA:   general  EBL:  Total I/O In: 400 [I.V.:400] Out: -   Drains: 6x26 Fr JJ ureteral stent on left (string in place)  Specimen: stone fragment  COUNTS CORRECT: YES  PLAN OF CARE: Discharge to home after PACU  PATIENT DISPOSITION:  PACU - hemodynamically stable.

## 2014-10-09 NOTE — ED Notes (Signed)
Pt in with co shob since today, had urethral stents place today for kidney stones.

## 2014-10-09 NOTE — Op Note (Signed)
Date of procedure: 10/09/2014  Preoperative diagnosis:  1. Left ureteral stone 2. Recurrent urinary tract infections  Postoperative diagnosis:  1. Same as above   Procedure: 1. Cystoscopy 2. Left ureteroscopy, laser lithotripsy 3. Basket extraction of stone fragments 4. Left ureteral stent exchange  Surgeon: Hollice Espy, MD  Anesthesia: General  Complications: None  Intraoperative findings: Ureteral stone identified at the level of the iliacs, obliterated and completely removed. Significant yeast biofilm on indwelling ureteral stent.  EBL: Minimal  Specimens: Stone fragments  Drains: 6 x 26 French double-J ureteral stent on left (string left in place)  Indication: [name]. is a 81 y.o. patient with nephrolithiasis, left obstructing ureteral stone status post stent, and recurrent urinary tract infections and Candida colonization of the bladder.  After reviewing the management options for treatment, he elected to proceed with the above surgical procedure(s). We have discussed the potential benefits and risks of the procedure, side effects of the proposed treatment, the likelihood of the patient achieving the goals of the procedure, and any potential problems that might occur during the procedure or recuperation. Informed consent has been obtained.  Description of procedure:  The patient was taken to the operating room and general anesthesia was induced.  The patient was placed in the dorsal lithotomy position, prepped and draped in the usual sterile fashion, and preoperative antibiotics were administered (ampicillin, gentamicin, and voriconazole based on yeast sensitivity data). A preoperative time-out was performed.   A 22 French rigid cystoscope was advanced per urethra into the bladder. The previously indwelling ureteral stent was identified and there was a significant amount of white debris. The bladder was drained. The distal end of the stent was grasped and the  stent was removed. Of note, there was a significant amount of biofilm on the surface of the stent. A sensor wire was then placed up to level of the kidney without difficulty and snapped in place as a safety wire. The stone shadow could be seen within the left mid ureter on the level of the iliacs. There is also shadowing in the lower pole of the left kidney consistent with his fairly significant left nonobstructing stone burden. A long rigid ureteroscope was then advanced alongside the wire through the UO without difficulty up to level of the stone. A 365  laser fiber was then brought in and using the settings of 0.8 J and 10 Hz, the stone was fragmented into approximately 6 or 7 smaller pieces. These pieces were then extracted individually using a 1.9 [place] nitinol basket.  Once the ureter was cleared, a second Super Stiff wire was placed up to level of the kidney under direct visualization.  A flexible ureteroscope was then placed up to level of the kidney under fluoroscopic guidance without difficulty. The ureteroscope was then slowly withdrawn down the length the ureter which was carefully inspected on the way to ensure that there was no residual stone fragments. There were no ureteral injuries or significant ureteral edema noted.  The bladder was then drained and a few of the small stone fragments were passed off as stone specimen. The safety wire was then backloaded over a rigid cystoscope at which time a 6 x 26 French double-J ureteral stent was placed up to level of the kidney. The wire was partially withdrawn until coil was noted within the renal pelvis. The wire was then fully withdrawn and a coil was noted within the bladder. The string was left on the stent and affixed to the  patient's glans using Mastisol and a Tegaderm. The patient was then cleaned and dried. He was repositioned in the supine position, reversed from anesthesia, and taken to the PACU in stable condition.  Plan: Patient remained  hemodynamically stable throughout the case. He'll be discharged from the PACU as long as he remains stable. He was instructed to remove his own stent on Monday. He will follow-up in our office in 4 weeks with a renal ultrasound prior.  Hollice Espy, M.D.

## 2014-10-09 NOTE — Progress Notes (Signed)
Gentamycin started on call to OR Ampicillin and Votivonsxole sent to OR with patient

## 2014-10-09 NOTE — ED Provider Notes (Signed)
[facility] Emergency Department Provider Note  ____________________________________________  Time seen: Approximately 11:05 PM  I have reviewed the triage vital signs and the nursing notes.   HISTORY  Chief Complaint Shortness of Breath    HPI [name]. is a 81 y.o. male who comes in with shortness of breath. The patient reports that he had surgery this morning to remove a stent that went from his kidney to his bladder, last the stone and then have another stent placed. The patient reports that after surgery he felt normal and went home at approximately lunchtime. He reports though that a few hours later he became very short of breath and felt as though he couldn't breathe. The patient didn't nebulizer treatment and reports that it didn't help. He also took some of his normal Symbicort and another dose of albuterol which again did not help. The patient's wife reports that she noticed he E and as though he was getting very agitated so they called the ambulance to have him come into the hospital for further evaluation. The patient denies any chest pain but reports that he still feels very short of breath. The patient does have a history of COPD and CHF as well as A. fib and MIs in the past. The breathing treatments has not helped his shortness of breath the patient has not had any coughs or fevers. Thinks seems to make the symptoms better and the patient does not wear oxygen at home.   Past Medical History  Diagnosis Date  . MI     a. tx'd w/ TPA at [facility] 01/29/1996  . HYPERTENSION   . HYPERLIPIDEMIA   . CAD     a. Myoview 06/2005: EF 50%, scar @ apex, mild peri-infarct ischemia  . Chronic atrial fibrillation     a. since 2006; b. on warfarin  . DM   . History of kidney stones   . Neuropathy of both feet   . Chronic diastolic CHF (congestive heart failure)     a. echo 04/2006: EF lower limits of nl, mod LVH, mild aortic root dilatation, & mild MR,  biatrial enlargement; b. echo 04/2013: EF 60%, mod dilated LA, mild MR & TR, mod pulm HTN w/ RV systolic pressure 53, c. echo 04/21/14: EF 55-60%, unable to exclude WMA, severely dilated LA 6.6 cm, nl RVSP, mildly dilated aortic root  . Kidney stone     a. s/p left ureteral stenting 04/24/14  . Arrhythmia   . COPD (chronic obstructive pulmonary disease)   . GERD (gastroesophageal reflux disease)   . Cancer     skin  . Arthritis   . CHF (congestive heart failure)   . CVA 3664,4034    x2  . Falls   . Poor balance   . Pulmonary fibrosis     Patient Active Problem List   Diagnosis Date Noted  . Cough 05/21/2014  . Type 2 diabetes mellitus with hyperglycemia 05/14/2014  . COLD (chronic obstructive lung disease) 05/14/2014  . BPH (benign prostatic hyperplasia) 05/14/2014  . Anxiety 05/14/2014  . Chronic diastolic CHF (congestive heart failure)   . Falls frequently 09/24/2013  . Insomnia 08/07/2013  . Hyperlipidemia 02/26/2009  . MITRAL REGURGITATION 02/26/2009  . Essential hypertension 02/26/2009  . MI 02/26/2009  . CAD (coronary artery disease) 02/26/2009  . ATRIAL FIBRILLATION 02/26/2009  . CVA (cerebral vascular accident) 02/26/2009    Past Surgical History  Procedure Laterality Date  . Tonsillectomy and adenoidectomy  1959  .  Carotid stent insertion  1997  . Cardiac catheterization  1997    [facility]  . Coronary angioplasty  1997    s/p stent placement x 2   . Kidney surgery  05/2013    s/p stent placement   . Bladder surgery      stent placement   . Circumcision  2016  . Stents ureters Bilateral   . Ureteroscopy with holmium laser lithotripsy Left 10/09/2014    Procedure: URETEROSCOPY WITH HOLMIUM LASER LITHOTRIPSY;  Surgeon: Hollice Espy, MD;  Location: [facility] ORS;  Service: Urology;  Laterality: Left;  . Cystoscopy w/ ureteral stent removal Left 10/09/2014    Procedure: CYSTOSCOPY WITH STENT REMOVAL;  Surgeon: Hollice Espy, MD;  Location: [facility] ORS;  Service: Urology;   Laterality: Left;  . Cystoscopy with stent placement Left 10/09/2014    Procedure: CYSTOSCOPY WITH STENT PLACEMENT;  Surgeon: Hollice Espy, MD;  Location: [facility] ORS;  Service: Urology;  Laterality: Left;    Current Outpatient Rx  Name  Route  Sig  Dispense  Refill  . acetaminophen (TYLENOL) 650 MG CR tablet   Oral   Take 650 mg by mouth every 8 (eight) hours as needed.           Marland Kitchen albuterol (2.5 MG/3ML) 0.083% NEBU 3 mL, albuterol (5 MG/ML) 0.5% NEBU 0.5 mL   Inhalation   Inhale 1 mg into the lungs.         Marland Kitchen albuterol (PROVENTIL HFA;VENTOLIN HFA) 108 (90 BASE) MCG/ACT inhaler   Inhalation   Inhale 2 puffs into the lungs every 6 (six) hours as needed.           . ALPRAZolam (XANAX) 0.25 MG tablet   Oral   Take 1 tablet (0.25 mg total) by mouth at bedtime as needed for anxiety.   90 tablet   0   . apixaban (ELIQUIS) 5 MG TABS tablet   Oral   Take 1 tablet (5 mg total) by mouth 2 (two) times daily.   180 tablet   3   . atorvastatin (LIPITOR) 40 MG tablet   Oral   Take 40 mg by mouth daily.           . bimatoprost (LUMIGAN) 0.03 % ophthalmic solution   Both Eyes   Place 1 drop into both eyes at bedtime.          . budesonide-formoterol (SYMBICORT) 160-4.5 MCG/ACT inhaler   Inhalation   Inhale 2 puffs into the lungs 2 (two) times daily.         . citalopram (CELEXA) 10 MG tablet   Oral   Take 1 tablet (10 mg total) by mouth daily.   90 tablet   1   . finasteride (PROSCAR) 5 MG tablet   Oral   Take 5 mg by mouth daily.         . fluconazole (DIFLUCAN) 100 MG tablet   Oral   Take 1 tablet (100 mg total) by mouth daily.   5 tablet   0     Start medication Thursday 6/30   . furosemide (LASIX) 40 MG tablet      Alternate '40mg'$  and '20mg'$  per day.  If your weight is up, then temporarily resume '40mg'$  each day.   90 tablet   3     *Please dispense 90 day supply*   . gabapentin (NEURONTIN) 100 MG capsule   Oral   Take 1 capsule (100 mg total) by  mouth 3 (three) times daily.  270 capsule   1     Discontinue #90 30 day supply   . HYDROcodone-acetaminophen (NORCO/VICODIN) 5-325 MG per tablet   Oral   Take 1 tablet by mouth every 4 (four) hours as needed for moderate pain.   20 tablet   0   . HYDROcodone-acetaminophen (NORCO/VICODIN) 5-325 MG per tablet   Oral   Take 1-2 tablets by mouth every 6 (six) hours as needed for moderate pain.   10 tablet   0   . insulin lispro (HUMALOG KWIKPEN) 100 UNIT/ML KiwkPen      11 units with breakfast, 11 units with with lunch and 9 units with supper Patient taking differently: 18 units with breakfast, 20 units with with lunch and 14 units with supper   11 pen   1   . Insulin Pen Needle 32G X 4 MM MISC      Use three times daily for insulin admin   100 each   3   . Insulin Syringe-Needle U-100 30G X 1/2" 1 ML MISC   Does not apply   1 each by Does not apply route 3 (three) times daily.   200 each   5   . LEVEMIR 100 UNIT/ML injection      INJECT 28 UNITS TOTAL UNDER THE SKIN AT BEDTIME. ADD 2 UNITS PER DAY UNTIL MORNING SUGAR IS LESS THAN 140 Patient taking differently: INJECT 25 UNITS TOTAL UNDER THE SKIN AT BEDTIME. ADD 2 UNITS PER DAY UNTIL MORNING SUGAR IS LESS THAN 140   3 vial   5     Dispense as written.   . metoprolol (TOPROL-XL) 50 MG 24 hr tablet   Oral   Take 50 mg by mouth 2 (two) times daily.          . nitroGLYCERIN (NITROSTAT) 0.4 MG SL tablet   Sublingual   Place 0.4 mg under the tongue every 5 (five) minutes as needed.           . nystatin-triamcinolone (MYCOLOG II) cream   Topical   Apply 1 application topically 2 (two) times daily.          . Omega-3 Fatty Acids (FISH OIL) 1000 MG CAPS   Oral   Take 1 capsule by mouth.         . potassium chloride (K-DUR) 10 MEQ tablet   Oral   Take 3 tablets (30 mEq total) by mouth daily.   270 tablet   3     *Please dispense 90 day supply*   . predniSONE (DELTASONE) 10 MG tablet      '10mg'$  a  day for 1 week, then back down to '5mg'$  a day. Patient taking differently: 5 mg. 5 mg alt with 10 mg daily         . sitaGLIPtin (JANUVIA) 100 MG tablet   Oral   Take 1 tablet (100 mg total) by mouth daily.   90 tablet   0   . tamsulosin (FLOMAX) 0.4 MG CAPS capsule      TAKE 1 CAPSULE DAILY   90 capsule   1   . tiotropium (SPIRIVA) 18 MCG inhalation capsule   Inhalation   Place 18 mcg into inhaler and inhale daily.             Allergies Contrast media; Morphine and related; and Niacin and related  Family History  Problem Relation Age of Onset  . Heart disease Maternal Grandmother   . Diabetes Maternal Grandmother   .  Cancer Neg Hx   . Stroke Neg Hx   . Heart disease Mother     Social History History  Substance Use Topics  . Smoking status: Former Smoker -- 2.00 packs/day for 40 years    Types: Cigarettes    Quit date: 05/24/1990  . Smokeless tobacco: Former Systems developer    Quit date: 05/24/1990  . Alcohol Use: No    Review of Systems Constitutional: No fever/chills Eyes: Blurred vision ENT: No sore throat. Cardiovascular: Denies chest pain. Respiratory: shortness of breath. Gastrointestinal: No abdominal pain.  No nausea, no vomiting.   Genitourinary: Negative for dysuria. Musculoskeletal: Negative for back pain. Skin: Negative for rash. Neurological: Negative for headaches, focal weakness or numbness. Hematological/Lymphatic:Bilateral lower extremity swelling  10-point ROS otherwise negative.  ____________________________________________   PHYSICAL EXAM:  VITAL SIGNS: ED Triage Vitals  Enc Vitals Group     BP 10/09/14 2219 149/94 mmHg     Pulse Rate 10/09/14 2219 116     Resp 10/09/14 2321 30     Temp 10/09/14 2219 98.2 F (36.8 C)     Temp Source 10/09/14 2219 Oral     SpO2 10/09/14 2217 97 %     Weight --      Height --      Head Cir --      Peak Flow --      Pain Score 10/09/14 2321 0     Pain Loc --      Pain Edu? --      Excl. in [place]?  --     Constitutional: Alert and oriented. Well appearing and in moderate distress. Eyes: Conjunctivae are normal. PERRL. EOMI. Head: Atraumatic. Nose: No congestion/rhinnorhea. Mouth/Throat: Mucous membranes are moist.  Oropharynx non-erythematous. Cardiovascular: Normal rate, regular rhythm. Grossly normal heart sounds.  Good peripheral circulation. Respiratory: Increased respiratory effort and tachypnea, patient has some prolonged expirations and diminished breath sounds throughout with no significant wheezes or crackles. Gastrointestinal: Soft and nontender. No distention. Positive bowel sounds Genitourinary: Deferred Musculoskeletal: Bilateral lower extremity edema Neurologic:  Normal speech and language. No gross focal neurologic deficits are appreciated.  Skin:  Skin is warm, dry and intact. No rash noted. Psychiatric: Mood and affect are normal.  ____________________________________________   LABS (all labs ordered are listed, but only abnormal results are displayed)  Labs Reviewed  COMPREHENSIVE METABOLIC PANEL - Abnormal; Notable for the following:    Glucose, Bld 122 (*)    BUN 22 (*)    Creatinine, Ser 1.33 (*)    Albumin 3.4 (*)    ALT 13 (*)    GFR calc non Af Amer 49 (*)    GFR calc Af Amer 57 (*)    All other components within normal limits  CBC WITH DIFFERENTIAL/PLATELET - Abnormal; Notable for the following:    WBC 21.0 (*)    MCHC 31.2 (*)    RDW 16.4 (*)    Neutro Abs 17.1 (*)    Monocytes Absolute 2.0 (*)    All other components within normal limits  PROTIME-INR - Abnormal; Notable for the following:    Prothrombin Time 16.5 (*)    All other components within normal limits  BRAIN NATRIURETIC PEPTIDE - Abnormal; Notable for the following:    B Natriuretic Peptide 586.0 (*)    All other components within normal limits  BLOOD GAS, VENOUS - Abnormal; Notable for the following:    pCO2, Ven 42 (*)    All other components within normal limits  CULTURE,  BLOOD (ROUTINE X 2)  CULTURE, BLOOD (ROUTINE X 2)  TROPONIN I  URINALYSIS COMPLETEWITH MICROSCOPIC ([facility] ONLY)   ____________________________________________  EKG  ED ECG REPORT I, Loney Hering, the attending physician, personally viewed and interpreted this ECG.   Date: 10/09/2014  EKG Time: 2210  Rate: 122  Rhythm: atrial fibrillation with rapid ventricular response  Axis: normal  Intervals:none  ST&T Change: St segment elevation 72m leads v1,2,3  ____________________________________________  RADIOLOGY  CXR: Mild CHF  ____________________________________________   PROCEDURES  Procedure(s) performed: None  Critical Care performed: Yes, see critical care note(s)  CRITICAL CARE Performed by: WCharlesetta IvoryP   Total critical care time: 411m  Critical care time was exclusive of separately billable procedures and treating other patients.  Critical care was necessary to treat or prevent imminent or life-threatening deterioration.  Critical care was time spent personally by me on the following activities: development of treatment plan with patient and/or surrogate as well as nursing, discussions with consultants, evaluation of patient's response to treatment, examination of patient, obtaining history from patient or surrogate, ordering and performing treatments and interventions, ordering and review of laboratory studies, ordering and review of radiographic studies, pulse oximetry and re-evaluation of patient's condition.  ____________________________________________   INITIAL IMPRESSION / ASSESSMENT AND PLAN / ED COURSE  Pertinent labs & imaging results that were available during my care of the patient were reviewed by me and considered in my medical decision making (see chart for details).  This is an [age]ear old male who comes in with shortness of breath tonight after having surgery. The patient does have some significant increased work of breathing. I  will start the patient on BiPAP as I await the results of his blood work and reassess the patient once he's been placed on BiPAP. The patient is in A. fib with RVR. I will see if his symptoms improve with the BiPAP but give him medication should he needed it better rate control.  ----------------------------------------- 1:10 AM on 10/10/2014 -----------------------------------------  The patient's work of breathing improved significantly on BiPAP. The patient does have some mild CHF on x-ray but his troponins are unremarkable. He also does have a significantly elevated white blood cell count. The patient's heart rate did not improve on the BiPAP so I did give him 15 mg of diltiazem which did improve his heart rate as well. He will also receive Lasix 40 mg IV 1 to help reduce some of the fluid. I discussed this with the hospitalist service and they will accept the patient to be admitted to the hospital. I will also give the patient a dose of ceftriaxone with a concern for sepsis and infection after his procedure today. ____________________________________________   FINAL CLINICAL IMPRESSION(S) / ED DIAGNOSES  Final diagnoses:  Atrial fibrillation with RVR  Dyspnea  Acute on chronic congestive heart failure, unspecified congestive heart failure type      AlLoney HeringMD 10/10/14 [phone]

## 2014-10-09 NOTE — OR Nursing (Signed)
FSBS post op is 57

## 2014-10-09 NOTE — ED Notes (Signed)
Pt here with SOB. Pt states that it started at 3pm.  Pt is not on home O@. Pt has a hx of COPD

## 2014-10-09 NOTE — Anesthesia Postprocedure Evaluation (Signed)
  Anesthesia Post-op Note  Patient: [name].  Procedure(s) Performed: Procedure(s): URETEROSCOPY WITH HOLMIUM LASER LITHOTRIPSY (Left) CYSTOSCOPY WITH STENT REMOVAL (Left) CYSTOSCOPY WITH STENT PLACEMENT (Left)  Anesthesia type:General  Patient location: PACU  Post pain: Pain level controlled  Post assessment: Post-op Vital signs reviewed, Patient's Cardiovascular Status Stable, Respiratory Function Stable, Patent Airway and No signs of Nausea or vomiting  Post vital signs: Reviewed and stable  Last Vitals:  Filed Vitals:   10/09/14 0900  BP: 149/76  Pulse: 64  Temp: 36.2 C  Resp: 17    Level of consciousness: awake, alert  and patient cooperative  Complications: No apparent anesthesia complications

## 2014-10-09 NOTE — Transfer of Care (Signed)
Immediate Anesthesia Transfer of Care Note  Patient: [name].  Procedure(s) Performed: Procedure(s): URETEROSCOPY WITH HOLMIUM LASER LITHOTRIPSY (Left) CYSTOSCOPY WITH STENT REMOVAL (Left) CYSTOSCOPY WITH STENT PLACEMENT (Left)  Patient Location: PACU  Anesthesia Type:General  Level of Consciousness: awake, oriented and patient cooperative  Airway & Oxygen Therapy: Patient Spontanous Breathing and Patient connected to face mask oxygen  Post-op Assessment: Report given to RN and Post -op Vital signs reviewed and stable  Post vital signs: Reviewed and stable  Last Vitals:  Filed Vitals:   10/09/14 0900  BP: 149/76  Pulse: 64  Temp: 36.2 C  Resp: 17    Complications: No apparent anesthesia complications

## 2014-10-09 NOTE — Discharge Instructions (Signed)
You have a ureteral stent in place.  This is a tube that extends from your kidney to your bladder.  This may cause urinary bleeding, burning with urination, and urinary frequency.  Please call our office or present to the ED if you develop fevers >101 or pain which is not able to be controlled with oral pain medications.  You may be given either Flomax and/ or ditropan to help with bladder spasms and stent pain in addition to pain medications.    Your stent in on a string.  I would like you to try to keep it in until Monday.  To remove, undo tape and pull string until entire stent is out.    You will follow up in the office in 4 weeks with an ultrasound of your kidneys just before.    [facility] [address], [place], [place] [address] [phone]                                                                                         AMBULATORY SURGERY  DISCHARGE INSTRUCTIONS   1) The drugs that you were given will stay in your system until tomorrow so for the next 24 hours you should not:  A) Drive an automobile B) Make any legal decisions C) Drink any alcoholic beverage   2) You may resume regular meals tomorrow.  Today it is better to start with liquids and gradually work up to solid foods.  You may eat anything you prefer, but it is better to start with liquids, then soup and crackers, and gradually work up to solid foods.   3) Please notify your doctor immediately if you have any unusual bleeding, trouble breathing, redness and pain at the surgery site, drainage, fever, or pain not relieved by medication. 4)   5) Your post-operative visit with Dr.    George Ina                                 is: Date:                        Time:    Please call to schedule your post-operative visit.  6) Additional Instructions:

## 2014-10-09 NOTE — Anesthesia Procedure Notes (Signed)
Procedure Name: LMA Insertion Date/Time: 10/09/2014 7:42 AM Performed by: Courtney Paris Pre-anesthesia Checklist: Patient identified, Suction available, Patient being monitored and Emergency Drugs available Patient Re-evaluated:Patient Re-evaluated prior to inductionOxygen Delivery Method: Circle system utilized Preoxygenation: Pre-oxygenation with 100% oxygen Intubation Type: Combination inhalational/ intravenous induction Ventilation: Mask ventilation without difficulty LMA: LMA inserted LMA Size: 5.0 Grade View: Grade II Number of attempts: 1 Tube secured with: Tape

## 2014-10-09 NOTE — Interval H&P Note (Signed)
History and Physical Interval Note:  10/09/2014 7:27 AM  [name].  has presented today for surgery, with the diagnosis of KIDNEY STONE  The various methods of treatment have been discussed with the patient and family. After consideration of risks, benefits and other options for treatment, the patient has consented to  Procedure(s): URETEROSCOPY WITH HOLMIUM LASER LITHOTRIPSY (Left) CYSTOSCOPY WITH STENT REMOVAL (Left) CYSTOSCOPY WITH STENT PLACEMENT (Left) as a surgical intervention .  The patient's history has been reviewed, patient examined, no change in status, stable for surgery.  I have reviewed the patient's chart and labs.  Questions were answered to the patient's satisfaction.    CBC repeated, currently on prednisone.     Hollice Espy

## 2014-10-09 NOTE — Anesthesia Preprocedure Evaluation (Signed)
Anesthesia Evaluation  Patient identified by MRN, date of birth, ID band Patient awake    Reviewed: Allergy & Precautions, NPO status , Patient's Chart, lab work & pertinent test results  Airway Mallampati: II  TM Distance: >3 FB Neck ROM: Limited    Dental  (+) Upper Dentures, Lower Dentures   Pulmonary sleep apnea and Continuous Positive Airway Pressure Ventilation , COPD COPD inhaler, former smoker,  breath sounds clear to auscultation        Cardiovascular Exercise Tolerance: Poor hypertension, + CAD, + Past MI, + Peripheral Vascular Disease and +CHF + dysrhythmias Atrial Fibrillation Rhythm:Irregular Rate:Normal  Stents, on elequis   Neuro/Psych Peripheral neuropathy. CVA    GI/Hepatic GERD-  Medicated and Controlled,  Endo/Other  diabetes, Type 2  Renal/GU      Musculoskeletal   Abdominal (+) + obese,  Abdomen: soft.    Peds  Hematology   Anesthesia Other Findings   Reproductive/Obstetrics                             Anesthesia Physical Anesthesia Plan  ASA: IV  Anesthesia Plan: General   Post-op Pain Management:    Induction: Intravenous  Airway Management Planned: LMA  Additional Equipment:   Intra-op Plan:   Post-operative Plan: Extubation in OR  Informed Consent: I have reviewed the patients History and Physical, chart, labs and discussed the procedure including the risks, benefits and alternatives for the proposed anesthesia with the patient or authorized representative who has indicated his/her understanding and acceptance.     Plan Discussed with: CRNA  Anesthesia Plan Comments:         Anesthesia Quick Evaluation

## 2014-10-10 ENCOUNTER — Encounter: Payer: Self-pay | Admitting: *Deleted

## 2014-10-10 DIAGNOSIS — T8359XA Infection and inflammatory reaction due to prosthetic device, implant and graft in urinary system, initial encounter: Secondary | ICD-10-CM | POA: Diagnosis present

## 2014-10-10 DIAGNOSIS — E785 Hyperlipidemia, unspecified: Secondary | ICD-10-CM | POA: Diagnosis present

## 2014-10-10 DIAGNOSIS — I272 Other secondary pulmonary hypertension: Secondary | ICD-10-CM | POA: Diagnosis present

## 2014-10-10 DIAGNOSIS — I10 Essential (primary) hypertension: Secondary | ICD-10-CM | POA: Diagnosis present

## 2014-10-10 DIAGNOSIS — J8410 Pulmonary fibrosis, unspecified: Secondary | ICD-10-CM | POA: Diagnosis present

## 2014-10-10 DIAGNOSIS — J449 Chronic obstructive pulmonary disease, unspecified: Secondary | ICD-10-CM | POA: Diagnosis present

## 2014-10-10 DIAGNOSIS — Z87891 Personal history of nicotine dependence: Secondary | ICD-10-CM | POA: Diagnosis not present

## 2014-10-10 DIAGNOSIS — I482 Chronic atrial fibrillation: Secondary | ICD-10-CM | POA: Diagnosis present

## 2014-10-10 DIAGNOSIS — A419 Sepsis, unspecified organism: Secondary | ICD-10-CM | POA: Diagnosis present

## 2014-10-10 DIAGNOSIS — N202 Calculus of kidney with calculus of ureter: Secondary | ICD-10-CM | POA: Diagnosis present

## 2014-10-10 DIAGNOSIS — Z87440 Personal history of urinary (tract) infections: Secondary | ICD-10-CM | POA: Diagnosis not present

## 2014-10-10 DIAGNOSIS — R0600 Dyspnea, unspecified: Secondary | ICD-10-CM | POA: Diagnosis present

## 2014-10-10 DIAGNOSIS — R0902 Hypoxemia: Secondary | ICD-10-CM | POA: Diagnosis present

## 2014-10-10 DIAGNOSIS — Z885 Allergy status to narcotic agent status: Secondary | ICD-10-CM | POA: Diagnosis not present

## 2014-10-10 DIAGNOSIS — Z8673 Personal history of transient ischemic attack (TIA), and cerebral infarction without residual deficits: Secondary | ICD-10-CM | POA: Diagnosis not present

## 2014-10-10 DIAGNOSIS — I4891 Unspecified atrial fibrillation: Secondary | ICD-10-CM

## 2014-10-10 DIAGNOSIS — I2510 Atherosclerotic heart disease of native coronary artery without angina pectoris: Secondary | ICD-10-CM | POA: Diagnosis present

## 2014-10-10 DIAGNOSIS — K219 Gastro-esophageal reflux disease without esophagitis: Secondary | ICD-10-CM | POA: Diagnosis present

## 2014-10-10 DIAGNOSIS — Z9981 Dependence on supplemental oxygen: Secondary | ICD-10-CM | POA: Diagnosis not present

## 2014-10-10 DIAGNOSIS — J849 Interstitial pulmonary disease, unspecified: Secondary | ICD-10-CM | POA: Diagnosis present

## 2014-10-10 DIAGNOSIS — I5042 Chronic combined systolic (congestive) and diastolic (congestive) heart failure: Secondary | ICD-10-CM | POA: Diagnosis present

## 2014-10-10 DIAGNOSIS — G629 Polyneuropathy, unspecified: Secondary | ICD-10-CM | POA: Diagnosis present

## 2014-10-10 LAB — BLOOD GAS, VENOUS
Acid-Base Excess: 2.4 mmol/L (ref 0.0–3.0)
Bicarbonate: 27.2 meq/L (ref 21.0–28.0)
FIO2: 0.3 %
Patient temperature: 37
RATE: 8 {breaths}/min
pCO2, Ven: 42 mmHg — ABNORMAL LOW (ref 44.0–60.0)
pH, Ven: 7.42 (ref 7.320–7.430)

## 2014-10-10 LAB — URINALYSIS COMPLETE WITH MICROSCOPIC (ARMC ONLY)
Bilirubin Urine: NEGATIVE
Glucose, UA: NEGATIVE mg/dL
Ketones, ur: NEGATIVE mg/dL
Nitrite: NEGATIVE
Protein, ur: 30 mg/dL — AB
Specific Gravity, Urine: 1.005 (ref 1.005–1.030)
Squamous Epithelial / LPF: NONE SEEN
pH: 5 (ref 5.0–8.0)

## 2014-10-10 LAB — GLUCOSE, CAPILLARY
Glucose-Capillary: 164 mg/dL — ABNORMAL HIGH (ref 65–99)
Glucose-Capillary: 206 mg/dL — ABNORMAL HIGH (ref 65–99)
Glucose-Capillary: 270 mg/dL — ABNORMAL HIGH (ref 65–99)
Glucose-Capillary: 262 mg/dL — ABNORMAL HIGH (ref 65–99)

## 2014-10-10 LAB — TSH: TSH: 6.01 u[IU]/mL — ABNORMAL HIGH (ref 0.350–4.500)

## 2014-10-10 LAB — BRAIN NATRIURETIC PEPTIDE: B Natriuretic Peptide: 586 pg/mL — ABNORMAL HIGH (ref 0.0–100.0)

## 2014-10-10 LAB — HEMOGLOBIN A1C: Hgb A1c MFr Bld: 8.3 % — ABNORMAL HIGH (ref 4.0–6.0)

## 2014-10-10 MED ORDER — VORICONAZOLE 200 MG PO TABS
200.0000 mg | ORAL_TABLET | Freq: Two times a day (BID) | ORAL | Status: DC
  Administered 2014-10-10 – 2014-10-11 (×3): 200 mg via ORAL
  Filled 2014-10-10 (×5): qty 1

## 2014-10-10 MED ORDER — FUROSEMIDE 10 MG/ML IJ SOLN
40.0000 mg | Freq: Once | INTRAMUSCULAR | Status: AC
  Administered 2014-10-10: 40 mg via INTRAVENOUS

## 2014-10-10 MED ORDER — DOCUSATE SODIUM 100 MG PO CAPS
100.0000 mg | ORAL_CAPSULE | Freq: Two times a day (BID) | ORAL | Status: DC
  Administered 2014-10-10 – 2014-10-11 (×2): 100 mg via ORAL
  Filled 2014-10-10 (×3): qty 1

## 2014-10-10 MED ORDER — POTASSIUM CHLORIDE ER 10 MEQ PO TBCR
30.0000 meq | EXTENDED_RELEASE_TABLET | Freq: Every day | ORAL | Status: DC
  Administered 2014-10-10 – 2014-10-11 (×2): 30 meq via ORAL
  Filled 2014-10-10 (×4): qty 3

## 2014-10-10 MED ORDER — APIXABAN 2.5 MG PO TABS
2.5000 mg | ORAL_TABLET | Freq: Two times a day (BID) | ORAL | Status: DC
  Administered 2014-10-10 – 2014-10-11 (×2): 2.5 mg via ORAL
  Filled 2014-10-10 (×2): qty 1

## 2014-10-10 MED ORDER — CEFTRIAXONE SODIUM IN DEXTROSE 20 MG/ML IV SOLN: 1.0000 g | INTRAVENOUS | Status: DC

## 2014-10-10 MED ORDER — CEFTRIAXONE SODIUM IN DEXTROSE 20 MG/ML IV SOLN
1.0000 g | Freq: Once | INTRAVENOUS | Status: AC
  Administered 2014-10-10: 1 g via INTRAVENOUS

## 2014-10-10 MED ORDER — ALPRAZOLAM 0.25 MG PO TABS: 0.2500 mg | ORAL_TABLET | Freq: Every evening | ORAL | Status: DC | PRN

## 2014-10-10 MED ORDER — FLUCONAZOLE 100 MG PO TABS
100.0000 mg | ORAL_TABLET | Freq: Every day | ORAL | Status: DC
  Administered 2014-10-10: 100 mg via ORAL
  Filled 2014-10-10: qty 1

## 2014-10-10 MED ORDER — ALBUTEROL SULFATE (2.5 MG/3ML) 0.083% IN NEBU: 3.0000 mL | INHALATION_SOLUTION | RESPIRATORY_TRACT | Status: DC | PRN

## 2014-10-10 MED ORDER — NYSTATIN-TRIAMCINOLONE 100000-0.1 UNIT/GM-% EX CREA
1.0000 "application " | TOPICAL_CREAM | Freq: Two times a day (BID) | CUTANEOUS | Status: DC
  Administered 2014-10-11: 1 via TOPICAL
  Filled 2014-10-10: qty 15

## 2014-10-10 MED ORDER — APIXABAN 5 MG PO TABS
5.0000 mg | ORAL_TABLET | Freq: Two times a day (BID) | ORAL | Status: DC
  Administered 2014-10-10: 5 mg via ORAL
  Filled 2014-10-10: qty 1

## 2014-10-10 MED ORDER — PREDNISONE 10 MG PO TABS
5.0000 mg | ORAL_TABLET | Freq: Every day | ORAL | Status: DC
  Administered 2014-10-10 – 2014-10-11 (×2): 5 mg via ORAL
  Filled 2014-10-10 (×2): qty 1

## 2014-10-10 MED ORDER — ACETAMINOPHEN 325 MG PO TABS: 650.0000 mg | ORAL_TABLET | Freq: Three times a day (TID) | ORAL | Status: DC | PRN

## 2014-10-10 MED ORDER — SODIUM CHLORIDE 0.9 % IV SOLN
INTRAVENOUS | Status: DC
  Administered 2014-10-10: 03:00:00 via INTRAVENOUS

## 2014-10-10 MED ORDER — FUROSEMIDE 10 MG/ML IJ SOLN
INTRAMUSCULAR | Status: AC
  Filled 2014-10-10: qty 4

## 2014-10-10 MED ORDER — FINASTERIDE 5 MG PO TABS
5.0000 mg | ORAL_TABLET | Freq: Every day | ORAL | Status: DC
  Administered 2014-10-10 – 2014-10-11 (×2): 5 mg via ORAL
  Filled 2014-10-10 (×2): qty 1

## 2014-10-10 MED ORDER — CITALOPRAM HYDROBROMIDE 20 MG PO TABS
10.0000 mg | ORAL_TABLET | Freq: Every day | ORAL | Status: DC
  Administered 2014-10-10 – 2014-10-11 (×2): 10 mg via ORAL
  Filled 2014-10-10 (×2): qty 1

## 2014-10-10 MED ORDER — NITROGLYCERIN 0.4 MG SL SUBL: 0.4000 mg | SUBLINGUAL_TABLET | SUBLINGUAL | Status: DC | PRN

## 2014-10-10 MED ORDER — FUROSEMIDE 40 MG PO TABS
40.0000 mg | ORAL_TABLET | Freq: Every day | ORAL | Status: DC
  Administered 2014-10-10 – 2014-10-11 (×2): 40 mg via ORAL
  Filled 2014-10-10 (×2): qty 1

## 2014-10-10 MED ORDER — ONDANSETRON HCL 4 MG PO TABS: 4.0000 mg | ORAL_TABLET | Freq: Four times a day (QID) | ORAL | Status: DC | PRN

## 2014-10-10 MED ORDER — DILTIAZEM HCL 25 MG/5ML IV SOLN
INTRAVENOUS | Status: AC
  Filled 2014-10-10: qty 5

## 2014-10-10 MED ORDER — HYDROCODONE-ACETAMINOPHEN 5-325 MG PO TABS: 1.0000 | ORAL_TABLET | ORAL | Status: DC | PRN

## 2014-10-10 MED ORDER — SODIUM CHLORIDE 0.9 % IJ SOLN
3.0000 mL | Freq: Two times a day (BID) | INTRAMUSCULAR | Status: DC
  Administered 2014-10-10 (×3): 3 mL via INTRAVENOUS

## 2014-10-10 MED ORDER — TIOTROPIUM BROMIDE MONOHYDRATE 18 MCG IN CAPS
18.0000 ug | ORAL_CAPSULE | Freq: Every day | RESPIRATORY_TRACT | Status: DC
  Administered 2014-10-10 – 2014-10-11 (×2): 18 ug via RESPIRATORY_TRACT
  Filled 2014-10-10: qty 5

## 2014-10-10 MED ORDER — ATORVASTATIN CALCIUM 20 MG PO TABS
40.0000 mg | ORAL_TABLET | Freq: Every day | ORAL | Status: DC
  Administered 2014-10-10 – 2014-10-11 (×2): 40 mg via ORAL
  Filled 2014-10-10 (×2): qty 2

## 2014-10-10 MED ORDER — INSULIN ASPART 100 UNIT/ML ~~LOC~~ SOLN
0.0000 [IU] | Freq: Three times a day (TID) | SUBCUTANEOUS | Status: DC
  Administered 2014-10-10: 3 [IU] via SUBCUTANEOUS
  Administered 2014-10-10: 5 [IU] via SUBCUTANEOUS
  Administered 2014-10-10: 2 [IU] via SUBCUTANEOUS
  Administered 2014-10-11: 3 [IU] via SUBCUTANEOUS
  Filled 2014-10-10 (×2): qty 3
  Filled 2014-10-10: qty 2
  Filled 2014-10-10: qty 5

## 2014-10-10 MED ORDER — TAMSULOSIN HCL 0.4 MG PO CAPS
0.4000 mg | ORAL_CAPSULE | Freq: Every day | ORAL | Status: DC
  Administered 2014-10-10 – 2014-10-11 (×2): 0.4 mg via ORAL
  Filled 2014-10-10 (×2): qty 1

## 2014-10-10 MED ORDER — CEFTRIAXONE SODIUM IN DEXTROSE 20 MG/ML IV SOLN
INTRAVENOUS | Status: AC
  Administered 2014-10-10: 1 g via INTRAVENOUS
  Filled 2014-10-10: qty 50

## 2014-10-10 MED ORDER — LATANOPROST 0.005 % OP SOLN
1.0000 [drp] | Freq: Every day | OPHTHALMIC | Status: DC
  Administered 2014-10-10: 1 [drp] via OPHTHALMIC
  Filled 2014-10-10: qty 2.5

## 2014-10-10 MED ORDER — METOPROLOL SUCCINATE ER 50 MG PO TB24
50.0000 mg | ORAL_TABLET | Freq: Two times a day (BID) | ORAL | Status: DC
  Administered 2014-10-10 – 2014-10-11 (×3): 50 mg via ORAL
  Filled 2014-10-10 (×3): qty 1

## 2014-10-10 MED ORDER — ACETAMINOPHEN ER 650 MG PO TBCR: 650.0000 mg | EXTENDED_RELEASE_TABLET | Freq: Three times a day (TID) | ORAL | Status: DC | PRN

## 2014-10-10 MED ORDER — ONDANSETRON HCL 4 MG/2ML IJ SOLN: 4.0000 mg | Freq: Four times a day (QID) | INTRAMUSCULAR | Status: DC | PRN

## 2014-10-10 MED ORDER — INSULIN DETEMIR 100 UNIT/ML ~~LOC~~ SOLN
10.0000 [IU] | Freq: Every day | SUBCUTANEOUS | Status: DC
  Administered 2014-10-10: 10 [IU] via SUBCUTANEOUS
  Filled 2014-10-10 (×2): qty 0.1

## 2014-10-10 MED ORDER — BUDESONIDE-FORMOTEROL FUMARATE 160-4.5 MCG/ACT IN AERO
2.0000 | INHALATION_SPRAY | Freq: Two times a day (BID) | RESPIRATORY_TRACT | Status: DC
  Administered 2014-10-10 – 2014-10-11 (×3): 2 via RESPIRATORY_TRACT
  Filled 2014-10-10: qty 6

## 2014-10-10 MED ORDER — OMEGA-3-ACID ETHYL ESTERS 1 G PO CAPS
1.0000 g | ORAL_CAPSULE | Freq: Every day | ORAL | Status: DC
  Administered 2014-10-10 – 2014-10-11 (×2): 1 g via ORAL
  Filled 2014-10-10 (×2): qty 1

## 2014-10-10 MED ORDER — LINAGLIPTIN 5 MG PO TABS
5.0000 mg | ORAL_TABLET | Freq: Every day | ORAL | Status: DC
  Administered 2014-10-10 – 2014-10-11 (×2): 5 mg via ORAL
  Filled 2014-10-10 (×2): qty 1

## 2014-10-10 MED ORDER — GABAPENTIN 100 MG PO CAPS
100.0000 mg | ORAL_CAPSULE | Freq: Three times a day (TID) | ORAL | Status: DC
  Administered 2014-10-10 – 2014-10-11 (×4): 100 mg via ORAL
  Filled 2014-10-10 (×4): qty 1

## 2014-10-10 NOTE — ED Notes (Signed)
Blood cultures x 2 drawn.  #1 site, left forearm.  #2 site, right forearm

## 2014-10-10 NOTE — Consult Note (Signed)
Urology Consult  I have been asked to see the patient by Dr. Fritzi Mandes , for evaluation and management of kidney.  Chief Complaint: shortness of breath  History of Present Illness: [name]. is a 81 y.o. year old admitted overnight for increasing shortness of breath yesterday evening following left ureteroscopy, laser lithotripsy, left ureteral stent placement (on string).  Surgery was uncomplicated and left ureteral stone was removed.  He did received IV ampicillin, gentamycin, and voriconazole given his history of candidemia.  Preop urine cultures did grow yeast with sensitivity data performed.  He reports that he was discharged from the PACU feeling quite well, voiding without difficulty but by late evening, developed increased shortness of breath.    He was also found to he tachycardia (chronic A-fib with RVR), leukocytosis ( preop WBC 19 -->14 likely due to steroid use), and tachypnea.    He was on bipap overnight but appearing to be breathing much better this AM on [place] O2.    He denies any fevers, chills, dysuria or voiding issues.  His stent is still in place.    Past Medical History  Diagnosis Date  . MI     a. tx'd w/ TPA at [facility] 01/29/1996  . HYPERTENSION   . HYPERLIPIDEMIA   . CAD     a. Myoview 06/2005: EF 50%, scar @ apex, mild peri-infarct ischemia  . Chronic atrial fibrillation     a. since 2006; b. on warfarin  . DM   . History of kidney stones   . Neuropathy of both feet   . Chronic diastolic CHF (congestive heart failure)     a. echo 04/2006: EF lower limits of nl, mod LVH, mild aortic root dilatation, & mild MR, biatrial enlargement; b. echo 04/2013: EF 60%, mod dilated LA, mild MR & TR, mod pulm HTN w/ RV systolic pressure 53, c. echo 04/21/14: EF 55-60%, unable to exclude WMA, severely dilated LA 6.6 cm, nl RVSP, mildly dilated aortic root  . Kidney stone     a. s/p left ureteral stenting 04/24/14  . Arrhythmia   . COPD (chronic obstructive pulmonary  disease)   . GERD (gastroesophageal reflux disease)   . Cancer     skin  . Arthritis   . CHF (congestive heart failure)   . 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 Falls   . Poor balance   . Pulmonary fibrosis     Past Surgical History  Procedure Laterality Date  . Tonsillectomy and adenoidectomy  1959  . Carotid stent insertion  1997  . Cardiac catheterization  1997    [facility]  . Coronary angioplasty  1997    s/p stent placement x 2   . Kidney surgery  05/2013    s/p stent placement   . Bladder surgery      stent placement   . Circumcision  2016  . Stents ureters Bilateral   . Ureteroscopy with holmium laser lithotripsy Left 10/09/2014    Procedure: URETEROSCOPY WITH HOLMIUM LASER LITHOTRIPSY;  Surgeon: Hollice Espy, MD;  Location: [facility] ORS;  Service: Urology;  Laterality: Left;  . Cystoscopy w/ ureteral stent removal Left 10/09/2014    Procedure: CYSTOSCOPY WITH STENT REMOVAL;  Surgeon: Hollice Espy, MD;  Location: [facility] ORS;  Service: Urology;  Laterality: Left;  . Cystoscopy with stent placement Left 10/09/2014    Procedure: CYSTOSCOPY WITH STENT PLACEMENT;  Surgeon: Hollice Espy, MD;  Location: [facility] ORS;  Service: Urology;  Laterality:  Left;    Home Medications:    Medication List    ASK your doctor about these medications        acetaminophen 650 MG CR tablet  Commonly known as:  TYLENOL  Take 650 mg by mouth every 8 (eight) hours as needed.     albuterol (2.5 MG/3ML) 0.083% NEBU 3 mL, albuterol (5 MG/ML) 0.5% NEBU 0.5 mL  Inhale 1 mg into the lungs.     albuterol 108 (90 BASE) MCG/ACT inhaler  Commonly known as:  PROVENTIL HFA;VENTOLIN HFA  Inhale 2 puffs into the lungs every 6 (six) hours as needed.     ALPRAZolam 0.25 MG tablet  Commonly known as:  XANAX  Take 1 tablet (0.25 mg total) by mouth at bedtime as needed for anxiety.     apixaban 5 MG Tabs tablet  Commonly known as:  ELIQUIS  Take 1 tablet (5 mg total) by mouth 2 (two) times daily.     atorvastatin 40 MG  tablet  Commonly known as:  LIPITOR  Take 40 mg by mouth daily.     bimatoprost 0.03 % ophthalmic solution  Commonly known as:  LUMIGAN  Place 1 drop into both eyes at bedtime.     budesonide-formoterol 160-4.5 MCG/ACT inhaler  Commonly known as:  SYMBICORT  Inhale 2 puffs into the lungs 2 (two) times daily.     citalopram 10 MG tablet  Commonly known as:  CELEXA  Take 1 tablet (10 mg total) by mouth daily.     finasteride 5 MG tablet  Commonly known as:  PROSCAR  Take 5 mg by mouth daily.     Fish Oil 1000 MG Caps  Take 1 capsule by mouth.     fluconazole 100 MG tablet  Commonly known as:  DIFLUCAN  Take 1 tablet (100 mg total) by mouth daily.     furosemide 40 MG tablet  Commonly known as:  LASIX  Alternate '40mg'$  and '20mg'$  per day.  If your weight is up, then temporarily resume '40mg'$  each day.     gabapentin 100 MG capsule  Commonly known as:  NEURONTIN  Take 1 capsule (100 mg total) by mouth 3 (three) times daily.     HYDROcodone-acetaminophen 5-325 MG per tablet  Commonly known as:  NORCO/VICODIN  Take 1 tablet by mouth every 4 (four) hours as needed for moderate pain.     HYDROcodone-acetaminophen 5-325 MG per tablet  Commonly known as:  NORCO/VICODIN  Take 1-2 tablets by mouth every 6 (six) hours as needed for moderate pain.     insulin lispro 100 UNIT/ML KiwkPen  Commonly known as:  HUMALOG KWIKPEN  11 units with breakfast, 11 units with with lunch and 9 units with supper     Insulin Pen Needle 32G X 4 MM Misc  Use three times daily for insulin admin     Insulin Syringe-Needle U-100 30G X 1/2" 1 ML Misc  1 each by Does not apply route 3 (three) times daily.     LEVEMIR 100 UNIT/ML injection  Generic drug:  insulin detemir  INJECT 28 UNITS TOTAL UNDER THE SKIN AT BEDTIME. ADD 2 UNITS PER DAY UNTIL MORNING SUGAR IS LESS THAN 140     metoprolol succinate 50 MG 24 hr tablet  Commonly known as:  TOPROL-XL  Take 50 mg by mouth 2 (two) times daily.      nitroGLYCERIN 0.4 MG SL tablet  Commonly known as:  NITROSTAT  Place 0.4 mg under the tongue  every 5 (five) minutes as needed.     nystatin-triamcinolone cream  Commonly known as:  MYCOLOG II  Apply 1 application topically 2 (two) times daily.     potassium chloride 10 MEQ tablet  Commonly known as:  K-DUR  Take 3 tablets (30 mEq total) by mouth daily.     predniSONE 10 MG tablet  Commonly known as:  DELTASONE  '10mg'$  a day for 1 week, then back down to '5mg'$  a day.     sitaGLIPtin 100 MG tablet  Commonly known as:  JANUVIA  Take 1 tablet (100 mg total) by mouth daily.     tamsulosin 0.4 MG Caps capsule  Commonly known as:  FLOMAX  TAKE 1 CAPSULE DAILY     tiotropium 18 MCG inhalation capsule  Commonly known as:  SPIRIVA  Place 18 mcg into inhaler and inhale daily.        Allergies:  Allergies  Allergen Reactions  . Contrast Media [Iodinated Diagnostic Agents] Shortness Of Breath  . Morphine And Related Other (See Comments)    Hallucinations   . Niacin And Related Dermatitis    Family History  Problem Relation Age of Onset  . Heart disease Maternal Grandmother   . Diabetes Maternal Grandmother   . Cancer Neg Hx   . Stroke Neg Hx   . Heart disease Mother     Social History:  reports that he quit smoking about 24 years ago. His smoking use included Cigarettes. He has a 80 pack-year smoking history. He quit smokeless tobacco use about 24 years ago. He reports that he does not drink alcohol or use illicit drugs.  ROS: A complete review of systems was performed.  All systems are negative except for pertinent findings as noted.  Physical Exam:  Vital signs in last 24 hours: Temp:  [97.9 F (36.6 C)-98.2 F (36.8 C)] 97.9 F (36.6 C) (06/30 1102) Pulse Rate:  [70-116] 86 (06/30 1102) Resp:  [19-30] 20 (06/30 1102) BP: (128-163)/(68-94) 150/71 mmHg (06/30 1102) SpO2:  [4 %-100 %] 98 % (06/30 1102) FiO2 (%):  [30 %] 30 % (06/30 0031) Weight:  [197 lb 3.2 oz  (89.449 kg)] 197 lb 3.2 oz (89.449 kg) (06/30 0308) Constitutional:  Alert and oriented, No acute distress.  Wearing 02, chronically ill appearing.  Wife at bedside. HEENT: [place] AT, moist mucus membranes.  Trachea midline, no masses Cardiovascular: 2+ bilateral pitting LE edema Respiratory: Normal respiratory effort.  No increased work of breathing.  GI: Abdomen is soft, nontender, nondistended, no abdominal masses. GU: No CVA tenderness.  Buried phallus with stent string tapped to glans.  Mild scrotal edema noted.   Skin: No rashes, bruises or suspicious lesions Lymph: No cervical or inguinal adenopathy Neurologic: Grossly intact, no focal deficits, moving all 4 extremities Psychiatric: Normal mood and affect   Laboratory Data:   Recent Labs  10/09/14 0609 10/09/14 2249  WBC 14.1* 21.0*  HGB 13.6 14.1  HCT 43.4 45.2    Recent Labs  10/09/14 2249  NA 144  K 4.3  CL 106  CO2 30  GLUCOSE 122*  BUN 22*  CREATININE 1.33*  CALCIUM 8.9    Recent Labs  10/09/14 2249  INR 1.31   No results for input(s): LABURIN in the last 72 hours. Results for orders placed or performed in visit on 09/20/14  Microscopic Examination     Status: Abnormal   Collection Time: 09/20/14  2:40 PM  Result Value Ref Range Status   WBC, UA >  30 (A) 0 -  5 /hpf Final   RBC, UA 3-10 (A) 0 -  2 /hpf Final   Epithelial Cells (non renal) 0-10 0 - 10 /hpf Final   Bacteria, UA Few None seen/Few Final   Yeast, UA Present (A) None seen Final  CULTURE, URINE COMPREHENSIVE     Status: Abnormal   Collection Time: 09/20/14  2:41 PM  Result Value Ref Range Status   Urine Culture, Comprehensive Final report (A)  Final   Result 1 Yeast isolated. (A)  Final    Comment: 50,000-100,000 colony forming units per mL Request for further identification must be made within 1 week.    Result 2 Comment (A)  Final    Comment: Corynebacterium species 25,000-50,000 colony forming units per mL Susceptibility not normally  performed on this organism.    Result 3 Lactobacillus species  Final    Comment: 50,000-100,000 colony forming units per mL Susceptibility not normally performed on this organism.   Organism Identification, Yeast     Status: None   Collection Time: 09/20/14  2:41 PM  Result Value Ref Range Status   Organism Identification, Yeast Final report  Final   RESULT 1 Candida glabrata  Final  Susceptibility, Aer + Anaerob     Status: None   Collection Time: 09/20/14  2:41 PM  Result Value Ref Range Status   Suscept, Aer + Anaerob CANCELED      Comment: We have received your request for additional testing, however we are unable to add the test you requested.  The following test(s) were not performed:  Result canceled by the ancillary    Suscept Result 1 Comment  Final    Comment: Specimen has been received and testing has been initiated.  Antifungal Suscep 4 Drugs     Status: None   Collection Time: 09/20/14  2:41 PM  Result Value Ref Range Status   Fungus Islt Candida glabrata  Final   Amphotericin B MIC 1.0 ug/mL  Final   Caspofungin MIC Comment  Final    Comment: 0.06 ug/mL Susceptible   Ketoconazole MIC 0.25 ug/mL  Final   Voriconazole MIC 0.5 ug/mL Susceptible  Final    Comment: CLSI does not have established guidelines for interpretation of these organism-drug combinations. For research use only.   Antifungal Suscep, Fluconazole     Status: None   Collection Time: 09/20/14  2:41 PM  Result Value Ref Range Status   Fluconazole Islt MIC 16.0 ug/mL  Final    Comment: Susceptible Dose Dependent  Organism ID by MALDI     Status: None   Collection Time: 09/20/14  2:41 PM  Result Value Ref Range Status   Organism ID by [place] Comment  Final    Comment: Specimen has been received. Testing has been initiated.  Aerobic ID by MALDI     Status: None   Collection Time: 09/20/14  2:41 PM  Result Value Ref Range Status   Aerobic ID by MALDI Comment  Final    Comment: Unable to identify  by [place]. See Organism ID by Sequencing.  Organism ID by Sequencing     Status: None   Collection Time: 09/20/14  2:41 PM  Result Value Ref Range Status   Organism ID by Sequencing Comment  Final    Comment: Specimen has been received. Sequencing has been initiated.  Aerobic Bacteria, ID by Seq.     Status: None   Collection Time: 09/20/14  2:41 PM  Result Value Ref Range  Status   Aerobic Bacteria, ID by Seq. Final Identification  Final    Comment: Corynebacterium aurimucosum     Radiologic Imaging: Dg Chest Port 1 View  10/09/2014   CLINICAL DATA:  Dyspnea, onset today.  EXAM: PORTABLE CHEST - 1 VIEW  COMPARISON:  06/03/2014  FINDINGS: There is moderate cardiomegaly, unchanged. There is mild interstitial fluid and vascular fullness, likely mild congestive heart failure. There is no alveolar edema. There is no large effusion.  IMPRESSION: Mild CHF   Electronically Signed   By: Andreas Newport M.D.   On: 10/09/2014 22:40    Impression/Assessment:  81 yo M POD 1 s/p left URS, LL, stent placement readmitted for SOB, CHF exacerbation.  He does have features consistent with sepsis including leukocytosis, tachypnea, and leukocytosis without fevers, however, I beleive that this underlying medical issues are the most likely cause of his current symptoms (CHF exacerbation, pulmonary fibrosis, Afib, and steroid use).  He reports that he is feeling much better this AM.    Plan:  -recommend urine culture -please ensure that left ureteral stent remains in place, patient advised to be careful not to accidentally pull stent -if sepsis truly suspected, recommend addition of voriconazole to current regimen given his history of fungemia and preoperative urine culture data -agree with supportive care for his severe underlying medical issues  10/10/2014, 12:49 PM  Hollice Espy,  MD  Thank you for involving me in this patient's care.  Please page with any further questions or concerns.

## 2014-10-10 NOTE — Progress Notes (Signed)
While in room giving insulin, patient and son Dominica Severin) addressed that the alarm on the chair was bothersome. Dominica Severin was remaining in the room and could help him with ambulation. Spoke with [place] RN, to confirm if it was ok to dc the chair alarm while gary was in the room. Chair alarm dc'ed and gary instructed to let nurse know if he leaves the room.

## 2014-10-10 NOTE — Progress Notes (Signed)
[place] at [place] NAME: [name]    MR#:  [id number]  DATE OF BIRTH:  [birth year]  SUBJECTIVE:    REVIEW OF SYSTEMS:   ROS Tolerating Diet: Tolerating PT:   DRUG ALLERGIES:   Allergies  Allergen Reactions  . Contrast Media [Iodinated Diagnostic Agents] Shortness Of Breath  . Morphine And Related Other (See Comments)    Hallucinations   . Niacin And Related Dermatitis    VITALS:  Blood pressure 150/71, pulse 86, temperature 97.9 F (36.6 C), temperature source Oral, resp. rate 20, height '5\' 6"'$  (1.676 m), weight 89.449 kg (197 lb 3.2 oz), SpO2 98 %.  PHYSICAL EXAMINATION:   Physical Exam  GENERAL:  81 y.o.-year-old patient lying in the bed with no acute distress.  EYES: Pupils equal, round, reactive to light and accommodation. No scleral icterus. Extraocular muscles intact.  HEENT: Head atraumatic, normocephalic. Oropharynx and nasopharynx clear.  NECK:  Supple, no jugular venous distention. No thyroid enlargement, no tenderness.  LUNGS: Normal breath sounds bilaterally, no wheezing, rales, rhonchi. No use of accessory muscles of respiration.  CARDIOVASCULAR: S1, S2 normal. No murmurs, rubs, or gallops.  ABDOMEN: Soft, nontender, nondistended. Bowel sounds present. No organomegaly or mass.  EXTREMITIES: No cyanosis, clubbing or edema b/l.    NEUROLOGIC: Cranial nerves II through XII are intact. No focal Motor or sensory deficits b/l.   PSYCHIATRIC: The patient is alert and oriented x 3.  SKIN: No obvious rash, lesion, or ulcer.    LABORATORY PANEL:   CBC  Recent Labs Lab 10/09/14 2249  WBC 21.0*  HGB 14.1  HCT 45.2  PLT 244    Chemistries   Recent Labs Lab 10/09/14 2249  NA 144  K 4.3  CL 106  CO2 30  GLUCOSE 122*  BUN 22*  CREATININE 1.33*  CALCIUM 8.9  AST 15  ALT 13*  ALKPHOS 83  BILITOT 0.7    Cardiac Enzymes  Recent Labs Lab 10/09/14 2249  TROPONINI 0.03    RADIOLOGY:   Dg Chest Port 1 View  10/09/2014   CLINICAL DATA:  Dyspnea, onset today.  EXAM: PORTABLE CHEST - 1 VIEW  COMPARISON:  06/03/2014  FINDINGS: There is moderate cardiomegaly, unchanged. There is mild interstitial fluid and vascular fullness, likely mild congestive heart failure. There is no alveolar edema. There is no large effusion.  IMPRESSION: Mild CHF   Electronically Signed   By: Andreas Newport M.D.   On: 10/09/2014 22:40     ASSESSMENT AND PLAN:    81 year old Caucasian male admitted for sepsis presents to the hospital via EMS following lithotripsy and ureteral stent placement due to chills and dyspnea  1. Sepsis:  -patient meets criteria via tachycardia, tachypnea, and leukocytosis. He is hemodynamically stable. Likely source is urine vs recent procedure - empirically treat with ceftriaxone. Blood cultures have been obtained. We will follow for growth and sensitivities and adjust antibiotics as needed. -pt seen by Dr Erlene Quan  2. Atrial fibrillation with RVR: - We will continue to monitor the patient on telemetry. Continue Eliquis.  3. Pulmonary fibrosis: Superimposed COPD as well. The patient feels much better now that he has been on BiPAP. We will continue noninvasive positive pressure ventilation overnight as the patient usually sleeps with CPAP at home. Lungs are clear. Continue prednisone. Continue inhalers per home regimen.  4. CHF: Systolic; last EF 24-23%. The patient has lower extremity edema but no rales on physical exam. Continue Lasix and metoprolol  per home regimen  5. Kidney stones status post lithotripsy and ureteral stent placement: -seen by Dr Erlene Quan  6. Diabetes mellitus type 2:  - reduced the patient's basal dose of insulin and place him on a sliding scale while hospitalized. He may continue his DPP 4 inhibitor.  7. DVT prophylaxis: As above  The patient is a full code.   Management plans discussed with the patient, family and they are in agreement.  CODE  STATUS: Full  DVT Prophylaxis: eliquis  TOTAL TIME TAKING CARE OF THIS PATIENT: 35 minutes.  >50% time spent on counselling and coordination of care  POSSIBLE D/C IN 1-2DAYS, DEPENDING ON CLINICAL CONDITION.   Joana Nolton M.D on 10/10/2014 at 12:34 PM  Between 7am to 6pm - Pager - [phone]  After 6pm go to [email or web address] - password [email or web address]  [facility]  Office  [phone]  CC: Primary care physician; Webb Silversmith, NP

## 2014-10-10 NOTE — Care Management (Signed)
Presents from home with shortness of breath.  Patient had urethral stents placed as an outpatient 6/29 morning.  Uses CPAP at home.  No chronic 02.  Admitted with sepsis and a fib with RVR.

## 2014-10-10 NOTE — ED Notes (Signed)
Incontinent large amount of urine.  Linen changed, skin care given.

## 2014-10-10 NOTE — Progress Notes (Deleted)
Discharge instructions explained to pt /verablized an understanding/iv and tele removed/ rx given to pt/ pt refusing home health or SNF/ pt called cousin to come pick up/ will transport off unit via wheelchair when his rides arrives.

## 2014-10-10 NOTE — H&P (Addendum)
[name]. is an 81 y.o. male.   Chief Complaint: Shortness of breath HPI: The patient presents to the hospital via EMS following lithotripsy and ureteral stent placement due to chills and dyspnea. He denies fever, chest pain, nausea, vomiting, or diarrhea. Past medical history is significant for COPD and interstitial lung disease as well as Atrial fibrilation. Upon arrival in the emergency department the patient's heart rate was greater then 110. He was also uncomfortably dyspneic. Laboratory evaluation revealed a white blood cell count of 21,000 which prompted the emergency department staff to call for admission.  Past Medical History  Diagnosis Date  . MI     a. tx'd w/ TPA at [facility] 01/29/1996  . HYPERTENSION   . HYPERLIPIDEMIA   . CAD     a. Myoview 06/2005: EF 50%, scar @ apex, mild peri-infarct ischemia  . Chronic atrial fibrillation     a. since 2006; b. on warfarin  . DM   . History of kidney stones   . Neuropathy of both feet   . Chronic diastolic CHF (congestive heart failure)     a. echo 04/2006: EF lower limits of nl, mod LVH, mild aortic root dilatation, & mild MR, biatrial enlargement; b. echo 04/2013: EF 60%, mod dilated LA, mild MR & TR, mod pulm HTN w/ RV systolic pressure 53, c. echo 04/21/14: EF 55-60%, unable to exclude WMA, severely dilated LA 6.6 cm, nl RVSP, mildly dilated aortic root  . Kidney stone     a. s/p left ureteral stenting 04/24/14  . Arrhythmia   . COPD (chronic obstructive pulmonary disease)   . GERD (gastroesophageal reflux disease)   . Cancer     skin  . Arthritis   . CHF (congestive heart failure)   . CVA [id number]    x2  . Falls   . Poor balance   . Pulmonary fibrosis     Past Surgical History  Procedure Laterality Date  . Tonsillectomy and adenoidectomy  1959  . Carotid stent insertion  1997  . Cardiac catheterization  1997    [facility]  . Coronary angioplasty  1997    s/p stent placement x 2   . Kidney surgery  05/2013    s/p stent  placement   . Bladder surgery      stent placement   . Circumcision  2016  . Stents ureters Bilateral   . Ureteroscopy with holmium laser lithotripsy Left 10/09/2014    Procedure: URETEROSCOPY WITH HOLMIUM LASER LITHOTRIPSY;  Surgeon: Hollice Espy, MD;  Location: [facility] ORS;  Service: Urology;  Laterality: Left;  . Cystoscopy w/ ureteral stent removal Left 10/09/2014    Procedure: CYSTOSCOPY WITH STENT REMOVAL;  Surgeon: Hollice Espy, MD;  Location: [facility] ORS;  Service: Urology;  Laterality: Left;  . Cystoscopy with stent placement Left 10/09/2014    Procedure: CYSTOSCOPY WITH STENT PLACEMENT;  Surgeon: Hollice Espy, MD;  Location: [facility] ORS;  Service: Urology;  Laterality: Left;    Family History  Problem Relation Age of Onset  . Heart disease Maternal Grandmother   . Diabetes Maternal Grandmother   . Cancer Neg Hx   . Stroke Neg Hx   . Heart disease Mother    Social History:  reports that he quit smoking about 24 years ago. His smoking use included Cigarettes. He has a 80 pack-year smoking history. He quit smokeless tobacco use about 24 years ago. He reports that he does not drink alcohol or use illicit drugs.  Allergies:  Allergies  Allergen Reactions  . Contrast Media [Iodinated Diagnostic Agents] Shortness Of Breath  . Morphine And Related Other (See Comments)    Hallucinations   . Niacin And Related Dermatitis    Prior to Admission medications   Medication Sig Start Date End Date Taking? Authorizing Provider  acetaminophen (TYLENOL) 650 MG CR tablet Take 650 mg by mouth every 8 (eight) hours as needed.      Historical Provider, MD  albuterol (2.5 MG/3ML) 0.083% NEBU 3 mL, albuterol (5 MG/ML) 0.5% NEBU 0.5 mL Inhale 1 mg into the lungs.    Historical Provider, MD  albuterol (PROVENTIL HFA;VENTOLIN HFA) 108 (90 BASE) MCG/ACT inhaler Inhale 2 puffs into the lungs every 6 (six) hours as needed.      Historical Provider, MD  ALPRAZolam Duanne Moron) 0.25 MG tablet Take 1 tablet (0.25  mg total) by mouth at bedtime as needed for anxiety. 04/15/14   Jearld Fenton, NP  apixaban (ELIQUIS) 5 MG TABS tablet Take 1 tablet (5 mg total) by mouth 2 (two) times daily. 06/14/14   Minna Merritts, MD  atorvastatin (LIPITOR) 40 MG tablet Take 40 mg by mouth daily.      Historical Provider, MD  bimatoprost (LUMIGAN) 0.03 % ophthalmic solution Place 1 drop into both eyes at bedtime.     Historical Provider, MD  budesonide-formoterol (SYMBICORT) 160-4.5 MCG/ACT inhaler Inhale 2 puffs into the lungs 2 (two) times daily.    Historical Provider, MD  citalopram (CELEXA) 10 MG tablet Take 1 tablet (10 mg total) by mouth daily. 06/11/14   Jearld Fenton, NP  finasteride (PROSCAR) 5 MG tablet Take 5 mg by mouth daily.    Historical Provider, MD  fluconazole (DIFLUCAN) 100 MG tablet Take 1 tablet (100 mg total) by mouth daily. 10/09/14   Hollice Espy, MD  furosemide (LASIX) 40 MG tablet Alternate 42m and 251mper day.  If your weight is up, then temporarily resume 4043mach day. 02/15/14   GraTonia GhentD  gabapentin (NEURONTIN) 100 MG capsule Take 1 capsule (100 mg total) by mouth 3 (three) times daily. 07/04/14   RegJearld FentonP  HYDROcodone-acetaminophen (NORCO/VICODIN) 5-325 MG per tablet Take 1 tablet by mouth every 4 (four) hours as needed for moderate pain. 09/13/14   RhoJohnn HaiA-C  HYDROcodone-acetaminophen (NORCO/VICODIN) 5-325 MG per tablet Take 1-2 tablets by mouth every 6 (six) hours as needed for moderate pain. 10/09/14   AshHollice EspyD  insulin lispro (HUMALOG KWIKPEN) 100 UNIT/ML KiwkPen 11 units with breakfast, 11 units with with lunch and 9 units with supper Patient taking differently: 18 units with breakfast, 20 units with with lunch and 14 units with supper 07/18/14   RadHaydee MonicaD  Insulin Pen Needle 32G X 4 MM MISC Use three times daily for insulin admin 07/04/14   Radhika P Phadke, MD  Insulin Syringe-Needle U-100 30G X 1/2" 1 ML MISC 1 each by Does not apply route 3  (three) times daily. 07/19/14   RegJearld FentonP  LEVEMIR 100 UNIT/ML injection INJECT 28 UNITS TOTAL UNDER THE SKIN AT BEDTIME. ADD 2 UNITS PER DAY UNTIL MORNING SUGAR IS LESS THAN 140 Patient taking differently: INJECT 25 UNITS TOTAL UNDER THE SKIN AT BEDTIME. ADD 2 UNITS PER DAY UNTIL MORNING SUGAR IS LESS THAN 140 09/05/14   RegJearld FentonP  metoprolol (TOPROL-XL) 50 MG 24 hr tablet Take 50 mg by mouth 2 (two) times daily.     Historical Provider,  MD  nitroGLYCERIN (NITROSTAT) 0.4 MG SL tablet Place 0.4 mg under the tongue every 5 (five) minutes as needed.      Historical Provider, MD  nystatin-triamcinolone (MYCOLOG II) cream Apply 1 application topically 2 (two) times daily.  03/12/14   Historical Provider, MD  Omega-3 Fatty Acids (FISH OIL) 1000 MG CAPS Take 1 capsule by mouth.    Historical Provider, MD  potassium chloride (K-DUR) 10 MEQ tablet Take 3 tablets (30 mEq total) by mouth daily. 01/23/14   Areta Haber Dunn, PA-C  predniSONE (DELTASONE) 10 MG tablet 47m a day for 1 week, then back down to 52ma day. Patient taking differently: 5 mg. 5 mg alt with 10 mg daily 05/20/14   GrTonia GhentMD  sitaGLIPtin (JANUVIA) 100 MG tablet Take 1 tablet (100 mg total) by mouth daily. 07/30/14   ReJearld FentonNP  tamsulosin (FLOMAX) 0.4 MG CAPS capsule TAKE 1 CAPSULE DAILY 07/19/14   ReJearld FentonNP  tiotropium (SPIRIVA) 18 MCG inhalation capsule Place 18 mcg into inhaler and inhale daily.      Historical Provider, MD     Results for orders placed or performed during the hospital encounter of 10/09/14 (from the past 48 hour(s))  Brain natriuretic peptide     Status: Abnormal   Collection Time: 10/09/14  8:49 PM  Result Value Ref Range   B Natriuretic Peptide 586.0 (H) 0.0 - 100.0 pg/mL  Troponin I     Status: None   Collection Time: 10/09/14 10:49 PM  Result Value Ref Range   Troponin I 0.03 <0.031 ng/mL    Comment:        NO INDICATION OF MYOCARDIAL INJURY.   Comprehensive metabolic  panel     Status: Abnormal   Collection Time: 10/09/14 10:49 PM  Result Value Ref Range   Sodium 144 135 - 145 mmol/L   Potassium 4.3 3.5 - 5.1 mmol/L   Chloride 106 101 - 111 mmol/L   CO2 30 22 - 32 mmol/L   Glucose, Bld 122 (H) 65 - 99 mg/dL   BUN 22 (H) 6 - 20 mg/dL   Creatinine, Ser 1.33 (H) 0.61 - 1.24 mg/dL   Calcium 8.9 8.9 - 10.3 mg/dL   Total Protein 7.0 6.5 - 8.1 g/dL   Albumin 3.4 (L) 3.5 - 5.0 g/dL   AST 15 15 - 41 U/L   ALT 13 (L) 17 - 63 U/L   Alkaline Phosphatase 83 38 - 126 U/L   Total Bilirubin 0.7 0.3 - 1.2 mg/dL   GFR calc non Af Amer 49 (L) >60 mL/min   GFR calc Af Amer 57 (L) >60 mL/min    Comment: (NOTE) The eGFR has been calculated using the CKD EPI equation. This calculation has not been validated in all clinical situations. eGFR's persistently <60 mL/min signify possible Chronic Kidney Disease.    Anion gap 8 5 - 15  CBC with Differential     Status: Abnormal   Collection Time: 10/09/14 10:49 PM  Result Value Ref Range   WBC 21.0 (H) 3.8 - 10.6 K/uL   RBC 5.41 4.40 - 5.90 MIL/uL   Hemoglobin 14.1 13.0 - 18.0 g/dL   HCT 45.2 40.0 - 52.0 %   MCV 83.5 80.0 - 100.0 fL   MCH 26.1 26.0 - 34.0 pg   MCHC 31.2 (L) 32.0 - 36.0 g/dL   RDW 16.4 (H) 11.5 - 14.5 %   Platelets 244 150 - 440 K/uL  Neutrophils Relative % 82 %   Neutro Abs 17.1 (H) 1.4 - 6.5 K/uL   Lymphocytes Relative 8 %   Lymphs Abs 1.7 1.0 - 3.6 K/uL   Monocytes Relative 10 %   Monocytes Absolute 2.0 (H) 0.2 - 1.0 K/uL   Eosinophils Relative 0 %   Eosinophils Absolute 0.0 0 - 0.7 K/uL   Basophils Relative 0 %   Basophils Absolute 0.1 0 - 0.1 K/uL  Protime-INR     Status: Abnormal   Collection Time: 10/09/14 10:49 PM  Result Value Ref Range   Prothrombin Time 16.5 (H) 11.4 - 15.0 seconds   INR 1.31   Blood gas, venous     Status: Abnormal   Collection Time: 10/10/14 12:38 AM  Result Value Ref Range   FIO2 0.30 %   Delivery systems BIPAP  15/5    Rate 8 resp/min   pH, Ven 7.42  7.320 - 7.430   pCO2, Ven 42 (L) 44.0 - 60.0 mmHg   Bicarbonate 27.2 21.0 - 28.0 mEq/L   Acid-Base Excess 2.4 0.0 - 3.0 mmol/L   Patient temperature 37.0    Collection site VENOUS    Sample type VENOUS    Dg Chest Port 1 View  10/09/2014   CLINICAL DATA:  Dyspnea, onset today.  EXAM: PORTABLE CHEST - 1 VIEW  COMPARISON:  06/03/2014  FINDINGS: There is moderate cardiomegaly, unchanged. There is mild interstitial fluid and vascular fullness, likely mild congestive heart failure. There is no alveolar edema. There is no large effusion.  IMPRESSION: Mild CHF   Electronically Signed   By: Andreas Newport M.D.   On: 10/09/2014 22:40    Review of Systems  Constitutional: Positive for chills. Negative for fever.  HENT: Negative for sore throat and tinnitus.   Eyes: Negative for blurred vision and redness.  Respiratory: Positive for shortness of breath. Negative for cough.   Cardiovascular: Negative for chest pain, palpitations, orthopnea and PND.  Gastrointestinal: Negative for nausea, vomiting, abdominal pain and diarrhea.  Genitourinary: Negative for dysuria, urgency and frequency.  Musculoskeletal: Negative for myalgias and joint pain.  Skin: Negative for rash.       No lesions  Neurological: Negative for speech change, focal weakness and weakness.  Endo/Heme/Allergies: Does not bruise/bleed easily.       No temperature intolerance  Psychiatric/Behavioral: Negative for depression and suicidal ideas.    Blood pressure 128/75, pulse 83, temperature 98.2 F (36.8 C), temperature source Oral, resp. rate 27, SpO2 99 %. Physical Exam  Nursing note and vitals reviewed. Constitutional: He is oriented to person, place, and time. He appears well-developed and well-nourished. No distress.  HENT:  Head: Normocephalic and atraumatic.  Mouth/Throat: Oropharynx is clear and moist.  Eyes: Conjunctivae and EOM are normal. Pupils are equal, round, and reactive to light. No scleral icterus.  Neck:  Normal range of motion. Neck supple. No JVD present. No tracheal deviation present. No thyromegaly present.  Cardiovascular: Normal rate, normal heart sounds and intact distal pulses.  An irregularly irregular rhythm present. Exam reveals no gallop and no friction rub.   No murmur heard. Respiratory: Breath sounds normal. Tachypnea noted.  On BiPAP  GI: Soft. Bowel sounds are normal. He exhibits no distension. There is no tenderness.  Genitourinary:  Deferred  Musculoskeletal: Normal range of motion. He exhibits edema.  Lymphadenopathy:    He has no cervical adenopathy.  Neurological: He is alert and oriented to person, place, and time. No cranial nerve deficit.  Skin: Skin  is warm and dry. No rash noted. He is not diaphoretic. No erythema.  Psychiatric: He has a normal mood and affect. His behavior is normal. Judgment and thought content normal.     Assessment/Plan This is an 81 year old Caucasian male admitted for sepsis likely secondary to urinary tract infection. 1. Sepsis: The patient meets criteria via tachycardia, tachypnea, and leukocytosis. He is hemodynamically stable. Likely source is urine although we have been unable to obtain a sample at this time. We will empirically treat with ceftriaxone. Blood cultures have been obtained. We will follow for growth and sensitivities and adjust antibiotics as needed. 2. Atrial fibrillation with RVR: Now rate controlled following IV fluid boluses. We will continue to monitor the patient on telemetry. Continue Eliquis. 3. Pulmonary fibrosis: Superimposed COPD as well. The patient feels much better now that he has been on BiPAP. We will continue noninvasive positive pressure ventilation overnight as the patient usually sleeps with CPAP at home. Lungs are clear. Continue prednisone. Continue inhalers per home regimen. 4. CHF: Systolic; last EF 15-97%. The patient has lower extremity edema but no rales on physical exam. Continue Lasix and metoprolol  per home regimen 5. Kidney stones status post lithotripsy and ureteral stent placement: Consult urology for further recommendations 6. Diabetes mellitus type 2: I reduced the patient's basal dose of insulin and place him on a sliding scale while hospitalized. He may continue his DPP 4 inhibitor. 7. DVT prophylaxis: As above 8. GI prophylaxis: None The patient is a full code. Time spent on admission orders and patient care approximately 35 minutes.  Harrie Foreman 10/10/2014, 2:32 AM

## 2014-10-11 LAB — GLUCOSE, CAPILLARY
Glucose-Capillary: 210 mg/dL — ABNORMAL HIGH (ref 65–99)
Glucose-Capillary: 307 mg/dL — ABNORMAL HIGH (ref 65–99)

## 2014-10-11 MED ORDER — APIXABAN 5 MG PO TABS: 5.0000 mg | ORAL_TABLET | Freq: Two times a day (BID) | ORAL | Status: DC

## 2014-10-11 MED ORDER — VORICONAZOLE 200 MG PO TABS: 200.0000 mg | ORAL_TABLET | Freq: Two times a day (BID) | ORAL | Status: DC

## 2014-10-11 MED ORDER — APIXABAN 2.5 MG PO TABS: 2.5000 mg | ORAL_TABLET | Freq: Two times a day (BID) | ORAL | Status: DC

## 2014-10-11 NOTE — Progress Notes (Addendum)
Patient on room air sats atrest  96%, ambulated in the hallway without oxygen  air sats at 80-82%, MD present, 2 L oxygen put back on exertion and pt recovered within 2 minutes to 94%, will continue to assess until discharge, MD plans to set up with home 02

## 2014-10-11 NOTE — Care Management (Signed)
Important Message  Patient Details  Name: [name]. MRN: [id number] Date of Birth: 1931/10/21   Medicare Important Message Given:  Yes-second notification given    [name] 10/11/2014, 10:59 AM

## 2014-10-11 NOTE — Progress Notes (Signed)
Pt discharged, iv and telemetry removed, reviewed instructions and home meds, rx given and printed updated med list, 02 tank delivered for patient to room, pt and family verbalized understanding, no questions verbalized, escorted by self to visitors entrance

## 2014-10-11 NOTE — Progress Notes (Signed)
Inpatient Diabetes Program Recommendations  AACE/ADA: New Consensus Statement on Inpatient Glycemic Control (2013)  Target Ranges:  Prepandial:   less than 140 mg/dL      Peak postprandial:   less than 180 mg/dL (1-2 hours)      Critically ill patients:  140 - 180 mg/dL   Results for LADARREN, STEINER (MRN [id number]) as of 10/11/2014 09:04  Ref. Range 10/10/2014 07:21 10/10/2014 11:00 10/10/2014 16:21 10/10/2014 19:46 10/11/2014 07:44  Glucose-Capillary Latest Ref Range: 65-99 mg/dL 164 (H) 206 (H) 270 (H) 262 (H) 210 (H)   Reason for assessment; elevated CBG  Diabetes history: Type 2 Outpatient Diabetes medications: Levemir 28 units q day, Januvia '100mg'$ /day, Humalog 18units qam, 20 units q lunch, 14 units q supper Current orders for Inpatient glycemic control: Levemir 10 units qday, Tradjenta '5mg'$ , Novolog correction 0-9 units tid with meals  Please consider  1.  increasing Levemir to 20 units qhs(fasting blood sugar '201mg'$ /dl)                                2.  increase Novolog correction scale to 0-15 units tid (post prandial blood sugars elevated despite 0-9 unit correction)                                3. add Novolog correction scale at hs (0-5 units)  Gentry Fitz, RN, BA, MHA, CDE Diabetes Coordinator Inpatient Diabetes Program  [phone] (Team Pager) [phone] ([place]) 10/11/2014 9:09 AM

## 2014-10-11 NOTE — Discharge Summary (Addendum)
[facility] [place] NAME: [name]    MR#:  [id number]  DATE OF BIRTH:  Jan 20, 1932  DATE OF ADMISSION:  10/09/2014 ADMITTING PHYSICIAN: Harrie Foreman, MD  DATE OF DISCHARGE: 10/11/2014  PRIMARY CARE PHYSICIAN: Webb Silversmith, NP    ADMISSION DIAGNOSIS:  Dyspnea [R06.00] Atrial fibrillation with RVR [I48.91] Acute on chronic congestive heart failure, unspecified congestive heart failure type [I50.9]  DISCHARGE DIAGNOSIS:  Sepsis syndrome suspected due to recent left urethral stent removal with lithotrypsy-resolved Leucocytosis Hypoxic due to chronic systolic CHF and underlying Pulmonary Fibrosis-now on home oxygen SECONDARY DIAGNOSIS:   Past Medical History  Diagnosis Date  . MI     a. tx'd w/ TPA at [facility] 01/29/1996  . HYPERTENSION   . HYPERLIPIDEMIA   . CAD     a. Myoview 06/2005: EF 50%, scar @ apex, mild peri-infarct ischemia  . Chronic atrial fibrillation     a. since 2006; b. on warfarin  . DM   . History of kidney stones   . Neuropathy of both feet   . Chronic diastolic CHF (congestive heart failure)     a. echo 04/2006: EF lower limits of nl, mod LVH, mild aortic root dilatation, & mild MR, biatrial enlargement; b. echo 04/2013: EF 60%, mod dilated LA, mild MR & TR, mod pulm HTN w/ RV systolic pressure 53, c. echo 04/21/14: EF 55-60%, unable to exclude WMA, severely dilated LA 6.6 cm, nl RVSP, mildly dilated aortic root  . Kidney stone     a. s/p left ureteral stenting 04/24/14  . Arrhythmia   . COPD (chronic obstructive pulmonary disease)   . GERD (gastroesophageal reflux disease)   . Cancer     skin  . Arthritis   . CHF (congestive heart failure)   . CVA [id number],[id number]    x2  . Falls   . Poor balance   . Pulmonary fibrosis     HOSPITAL COURSE:   81 year old Caucasian male admitted for sepsis presents to the hospital via EMS following lithotripsy and ureteral stent placement due to chills and dyspnea  1.  Sepsis: -patient meets criteria via tachycardia, tachypnea, and leukocytosis. He is hemodynamically stable. Likely source is urine vs recent procedure --pt seen by Dr Erlene Quan. Follow rec for po voriconazole for 7 days -no indications for any po abxs -afebrile  2. Atrial fibrillation with RVR: - stable Continue Eliquis 2.5 mg bid for 7 days and then 5 mg bid(dose reduced since pt on azoles)  3. Pulmonary fibrosis: Superimposed COPD as well. The patient feels much better now that he has been on BiPAP. We will continue noninvasive positive pressure ventilation overnight as the patient usually sleeps with CPAP at home. Lungs are clear. Continue prednisone. Continue inhalers per home regimen. -sats 96% on RA however dropped to the 80's on exertion and recovered to 92% on 2liters Will set up home oxygen  4. CHF: Systolic; last EF 41-93%. The patient has lower extremity edema but no rales on physical exam. Continue Lasix and metoprolol per home regimen  5. Kidney stones status post lithotripsy and ureteral stent placement: -seen by Dr Erlene Quan  6. Diabetes mellitus type 2:  - reduced the patient's basal dose of insulin and place him on a sliding scale while hospitalized. He may continue his DPP 4 inhibitor.  7. DVT prophylaxis: As above  The patient is a full code.   Overall appears stable at baseline. Spoke with wife and ok to  go home DISCHARGE CONDITIONS:   fair  CONSULTS OBTAINED:  Treatment Team:  Hollice Espy, MD  DRUG ALLERGIES:   Allergies  Allergen Reactions  . Contrast Media [Iodinated Diagnostic Agents] Shortness Of Breath  . Morphine And Related Other (See Comments)    Hallucinations   . Niacin And Related Dermatitis    DISCHARGE MEDICATIONS:   Current Discharge Medication List    CONTINUE these medications which have NOT CHANGED   Details  acetaminophen (TYLENOL) 650 MG CR tablet Take 650 mg by mouth every 8 (eight) hours as needed.      albuterol (2.5  MG/3ML) 0.083% NEBU 3 mL, albuterol (5 MG/ML) 0.5% NEBU 0.5 mL Inhale 1 mg into the lungs.    albuterol (PROVENTIL HFA;VENTOLIN HFA) 108 (90 BASE) MCG/ACT inhaler Inhale 2 puffs into the lungs every 6 (six) hours as needed.      ALPRAZolam (XANAX) 0.25 MG tablet Take 1 tablet (0.25 mg total) by mouth at bedtime as needed for anxiety. Qty: 90 tablet, Refills: 0   Associated Diagnoses: Anxiety    apixaban (ELIQUIS) 5 MG TABS tablet Take 1 tablet (5 mg total) by mouth 2 (two) times daily. Qty: 180 tablet, Refills: 3    atorvastatin (LIPITOR) 40 MG tablet Take 40 mg by mouth daily.      bimatoprost (LUMIGAN) 0.03 % ophthalmic solution Place 1 drop into both eyes at bedtime.     budesonide-formoterol (SYMBICORT) 160-4.5 MCG/ACT inhaler Inhale 2 puffs into the lungs 2 (two) times daily.    citalopram (CELEXA) 10 MG tablet Take 1 tablet (10 mg total) by mouth daily. Qty: 90 tablet, Refills: 1    finasteride (PROSCAR) 5 MG tablet Take 5 mg by mouth daily.    fluconazole (DIFLUCAN) 100 MG tablet Take 1 tablet (100 mg total) by mouth daily. Qty: 5 tablet, Refills: 0    furosemide (LASIX) 40 MG tablet Alternate '40mg'$  and '20mg'$  per day.  If your weight is up, then temporarily resume '40mg'$  each day. Qty: 90 tablet, Refills: 3    gabapentin (NEURONTIN) 100 MG capsule Take 1 capsule (100 mg total) by mouth 3 (three) times daily. Qty: 270 capsule, Refills: 1   Associated Diagnoses: Type 2 diabetes mellitus with hyperglycemia    !! HYDROcodone-acetaminophen (NORCO/VICODIN) 5-325 MG per tablet Take 1 tablet by mouth every 4 (four) hours as needed for moderate pain. Qty: 20 tablet, Refills: 0    !! HYDROcodone-acetaminophen (NORCO/VICODIN) 5-325 MG per tablet Take 1-2 tablets by mouth every 6 (six) hours as needed for moderate pain. Qty: 10 tablet, Refills: 0    insulin lispro (HUMALOG KWIKPEN) 100 UNIT/ML KiwkPen 11 units with breakfast, 11 units with with lunch and 9 units with supper Qty: 11 pen,  Refills: 1    Insulin Pen Needle 32G X 4 MM MISC Use three times daily for insulin admin Qty: 100 each, Refills: 3    Insulin Syringe-Needle U-100 30G X 1/2" 1 ML MISC 1 each by Does not apply route 3 (three) times daily. Qty: 200 each, Refills: 5    LEVEMIR 100 UNIT/ML injection INJECT 28 UNITS TOTAL UNDER THE SKIN AT BEDTIME. ADD 2 UNITS PER DAY UNTIL MORNING SUGAR IS LESS THAN 140 Qty: 3 vial, Refills: 5    metoprolol (TOPROL-XL) 50 MG 24 hr tablet Take 50 mg by mouth 2 (two) times daily.     nitroGLYCERIN (NITROSTAT) 0.4 MG SL tablet Place 0.4 mg under the tongue every 5 (five) minutes as needed.  nystatin-triamcinolone (MYCOLOG II) cream Apply 1 application topically 2 (two) times daily.     Omega-3 Fatty Acids (FISH OIL) 1000 MG CAPS Take 1 capsule by mouth.    potassium chloride (K-DUR) 10 MEQ tablet Take 3 tablets (30 mEq total) by mouth daily. Qty: 270 tablet, Refills: 3    predniSONE (DELTASONE) 10 MG tablet '10mg'$  a day for 1 week, then back down to '5mg'$  a day.    sitaGLIPtin (JANUVIA) 100 MG tablet Take 1 tablet (100 mg total) by mouth daily. Qty: 90 tablet, Refills: 0    tamsulosin (FLOMAX) 0.4 MG CAPS capsule TAKE 1 CAPSULE DAILY Qty: 90 capsule, Refills: 1    tiotropium (SPIRIVA) 18 MCG inhalation capsule Place 18 mcg into inhaler and inhale daily.       !! - Potential duplicate medications found. Please discuss with provider.     If you experience worsening of your admission symptoms, develop shortness of breath, life threatening emergency, suicidal or homicidal thoughts you must seek medical attention immediately by calling 911 or calling your MD immediately  if symptoms less severe.  You Must read complete instructions/literature along with all the possible adverse reactions/side effects for all the Medicines you take and that have been prescribed to you. Take any new Medicines after you have completely understood and accept all the possible adverse  reactions/side effects.   Please note  You were cared for by a hospitalist during your hospital stay. If you have any questions about your discharge medications or the care you received while you were in the hospital after you are discharged, you can call the unit and asked to speak with the hospitalist on call if the hospitalist that took care of you is not available. Once you are discharged, your primary care physician will handle any further medical issues. Please note that NO REFILLS for any discharge medications will be authorized once you are discharged, as it is imperative that you return to your primary care physician (or establish a relationship with a primary care physician if you do not have one) for your aftercare needs so that they can reassess your need for medications and monitor your lab values. Today   SUBJECTIVE   Feels better today. Some mild sob earlier  VITAL SIGNS:  Blood pressure 141/67, pulse 71, temperature 98.4 F (36.9 C), temperature source Oral, resp. rate 20, height '5\' 6"'$  (1.676 m), weight 90.266 kg (199 lb), SpO2 96 %.  I/O:   Intake/Output Summary (Last 24 hours) at 10/11/14 1102 Last data filed at 10/11/14 0907  Gross per 24 hour  Intake    480 ml  Output    900 ml  Net   -420 ml    PHYSICAL EXAMINATION:  GENERAL:  81 y.o.-year-old patient lying in the bed with no acute distress.  EYES: Pupils equal, round, reactive to light and accommodation. No scleral icterus. Extraocular muscles intact.  HEENT: Head atraumatic, normocephalic. Oropharynx and nasopharynx clear.  NECK:  Supple, no jugular venous distention. No thyroid enlargement, no tenderness.  LUNGS: Normal breath sounds bilaterally, no wheezing, rales,rhonchi or crepitation. No use of accessory muscles of respiration.  CARDIOVASCULAR: S1, S2 normal. No murmurs, rubs, or gallops.  ABDOMEN: Soft, non-tender, non-distended. Bowel sounds present. No organomegaly or mass.  EXTREMITIES: No pedal edema,  cyanosis, or clubbing.  NEUROLOGIC: Cranial nerves II through XII are intact. Muscle strength 5/5 in all extremities. Sensation intact. Gait not checked.  PSYCHIATRIC: The patient is alert and oriented x 3.  SKIN:  No obvious rash, lesion, or ulcer.   DATA REVIEW:   CBC   Recent Labs Lab 10/09/14 2249  WBC 21.0*  HGB 14.1  HCT 45.2  PLT 244    Chemistries   Recent Labs Lab 10/09/14 2249  NA 144  K 4.3  CL 106  CO2 30  GLUCOSE 122*  BUN 22*  CREATININE 1.33*  CALCIUM 8.9  AST 15  ALT 13*  ALKPHOS 83  BILITOT 0.7    Microbiology Results   No results found for this or any previous visit (from the past 240 hour(s)).  RADIOLOGY:  Dg Chest Port 1 View  10/09/2014   CLINICAL DATA:  Dyspnea, onset today.  EXAM: PORTABLE CHEST - 1 VIEW  COMPARISON:  06/03/2014  FINDINGS: There is moderate cardiomegaly, unchanged. There is mild interstitial fluid and vascular fullness, likely mild congestive heart failure. There is no alveolar edema. There is no large effusion.  IMPRESSION: Mild CHF   Electronically Signed   By: Andreas Newport M.D.   On: 10/09/2014 22:40     Management plans discussed with the patient, family and they are in agreement.  CODE STATUS:     Code Status Orders        Start     Ordered   10/10/14 0259  Full code   Continuous     10/10/14 0258      TOTAL TIME TAKING CARE OF THIS PATIENT: 40 minutes.    Kalli Greenfield M.D on 10/11/2014 at 11:02 AM  Between 7am to 6pm - Pager - [phone] After 6pm go to [email or web address] - password [email or web address] [facility]  [facility]  Office  [phone]  CC: Primary care physician; Webb Silversmith, NP

## 2014-10-11 NOTE — Care Management (Signed)
Patient for discharge home today and has qualified for home 02.  Patient has used [facility] in the past and this is the agency preference.  Was recently closed to home health nursing.  Requests same nurse- Amy Pope.  Referral for nursing and home 02 called to [facility]

## 2014-10-15 ENCOUNTER — Telehealth: Payer: Self-pay

## 2014-10-15 LAB — STONE ANALYSIS
CA OXALATE, MONOHYDR.: 95 %
Ca phos cry stone ql IR: 5 %
Stone Weight KSTONE: 60 mg

## 2014-10-15 LAB — CULTURE, BLOOD (ROUTINE X 2)
CULTURE: NO GROWTH
Culture: NO GROWTH

## 2014-10-15 NOTE — Telephone Encounter (Signed)
PLEASE NOTE: All timestamps contained within this report are represented as [place] Standard Time. CONFIDENTIALTY NOTICE: This fax transmission is intended only for the addressee. It contains information that is legally privileged, confidential or otherwise protected from use or disclosure. If you are not the intended recipient, you are strictly prohibited from reviewing, disclosing, copying using or disseminating any of this information or taking any action in reliance on or regarding this information. If you have received this fax in error, please notify us immediately by telephone so that we can arrange for its return to [place]. Phone: [phone], Toll-Free: [phone], Fax: [phone] Page: 1 of 1 Call Id: [id number] [place] Patient Name: [name] Gender: Male DOB: 1931/04/19 Age: 81 Y 10 M Return Phone Number: [phone] (Primary) Address: [address] City/State/Zip: [place] [address] Client [facility] Night - Client Client Site [place] Physician Aron, [place] Type Call Caller Name [place] Phone Number [phone] Relationship To Patient Care Giver Is this call to report lab results? No Call Type General Information Initial Comment Caller Mandy with [facility] states that she a Contra indication on his chart. She has to let a doctor know within in 24 hours. The PT is going to be done with meds in 3 days, has already been on five days. CB# [phone]. Names of the medications - Voriconazole (Antibiotic) and Tamsulosin (Flomax) General Information Type Message Only Nurse Assessment Guidelines Guideline Title Affirmed Question Affirmed Notes Nurse Date/Time (Eastern Time) Disp. Time Eilene Ghazi Time) Disposition Final User 10/14/2014 6:13:46 PM General Information Provided Yes  Dorene Sorrow After Care Instructions Given Call Event Type User Date / Time Description

## 2014-10-15 NOTE — Telephone Encounter (Signed)
Aware-he was discharged on both of these medications - please watch for any side effects (the abx will not be long term)    It looks like the discharging physician was aware  Will also copy to PCP and Dr Deborra Medina

## 2014-10-17 ENCOUNTER — Encounter: Payer: Self-pay | Admitting: Internal Medicine

## 2014-10-17 ENCOUNTER — Ambulatory Visit (INDEPENDENT_AMBULATORY_CARE_PROVIDER_SITE_OTHER): Payer: Medicare Other | Admitting: Internal Medicine

## 2014-10-17 ENCOUNTER — Ambulatory Visit: Payer: Medicare Other | Admitting: Endocrinology

## 2014-10-17 VITALS — BP 128/78 | HR 63 | Temp 98.1°F | Wt 196.0 lb

## 2014-10-17 DIAGNOSIS — E1165 Type 2 diabetes mellitus with hyperglycemia: Secondary | ICD-10-CM

## 2014-10-17 DIAGNOSIS — A419 Sepsis, unspecified organism: Secondary | ICD-10-CM

## 2014-10-17 DIAGNOSIS — I509 Heart failure, unspecified: Secondary | ICD-10-CM

## 2014-10-17 NOTE — Progress Notes (Signed)
Subjective:    Patient ID: Shawn Grace., male    DOB: 1932/03/02, 81 y.o.   MRN: [id number]  HPI  Pt presents to the clinic today for hospital follow up. He was taken to [facility] with chills and dyspnea following his lithotripsy procedure on 6/29. He was found to have urosepsis and CHF exacerbation. He was started on Voriconazole. They also put him on Prednisone and sent him how with 24 hour oxygen due to decreased saturations. He was discharged on 7/1. He does have home health nurse coming in a few days per week. He will be starting outpatient PT soon. He reports he has 1 pill left of the Voriconzaole. He has been wearing the oxygen during the day and his CPAP at night. Overall he is urinating much better than before. He is taking his Lasix as prescribed. His wife is keeping a record of his urinary output, he puts out at least 6-12 ounces of urine with each void. His weights have ranged from 193-198 lbs. He does not feel like he needs the oxygen 24/7. He has a followup appt with Dr. Erlene Quan next week.  Additionally, he wants to discuss his DM2. His endocrinologist is leaving the practice next month. He really does not want to have to go to [place]. He would like to know if I can start managing his diabetes again. His last A1C 1 week ago was 8.3%. He takes Januvia, Humulog 18, 20, 14 units at breakfast, lunch, dinner. He takes 25 units of Levemir at night. His blood sugars are ranging 109-330. He tires to eat what he is supposed to but he loves sweets. His last eye exam was within the last 6 months. Flu 12/2013. Prevnar 06/2013. Pneumovax 04/2008.  Review of Systems      Past Medical History  Diagnosis Date  . MI     a. tx'd w/ TPA at [facility] 01/29/1996  . HYPERTENSION   . HYPERLIPIDEMIA   . CAD     a. Myoview 06/2005: EF 50%, scar @ apex, mild peri-infarct ischemia  . Chronic atrial fibrillation     a. since 2006; b. on warfarin  . DM   . History of kidney stones   . Neuropathy of both  feet   . Chronic diastolic CHF (congestive heart failure)     a. echo 04/2006: EF lower limits of nl, mod LVH, mild aortic root dilatation, & mild MR, biatrial enlargement; b. echo 04/2013: EF 60%, mod dilated LA, mild MR & TR, mod pulm HTN w/ RV systolic pressure 53, c. echo 04/21/14: EF 55-60%, unable to exclude WMA, severely dilated LA 6.6 cm, nl RVSP, mildly dilated aortic root  . Kidney stone     a. s/p left ureteral stenting 04/24/14  . Arrhythmia   . COPD (chronic obstructive pulmonary disease)   . GERD (gastroesophageal reflux disease)   . Cancer     skin  . Arthritis   . CHF (congestive heart failure)   . CVA 3664,4034    x2  . Falls   . Poor balance   . Pulmonary fibrosis     Current Outpatient Prescriptions  Medication Sig Dispense Refill  . acetaminophen (TYLENOL) 650 MG CR tablet Take 650 mg by mouth every 8 (eight) hours as needed.      Marland Kitchen albuterol (2.5 MG/3ML) 0.083% NEBU 3 mL, albuterol (5 MG/ML) 0.5% NEBU 0.5 mL Inhale 1 mg into the lungs.    Marland Kitchen albuterol (PROVENTIL HFA;VENTOLIN HFA) 108 (90 BASE)  MCG/ACT inhaler Inhale 2 puffs into the lungs every 6 (six) hours as needed.      . ALPRAZolam (XANAX) 0.25 MG tablet Take 1 tablet (0.25 mg total) by mouth at bedtime as needed for anxiety. 90 tablet 0  . apixaban (ELIQUIS) 2.5 MG TABS tablet Take 1 tablet (2.5 mg total) by mouth 2 (two) times daily. Till 10/17/14 and then resume 5 mg bid from 10/18/14 60 tablet 0  . [START ON 10/18/2014] apixaban (ELIQUIS) 5 MG TABS tablet Take 1 tablet (5 mg total) by mouth 2 (two) times daily. Take 2.5 mg twice a day till 10/17/14 and from 10/18/14 start 5 mg bid (your original dose) 180 tablet 3  . atorvastatin (LIPITOR) 40 MG tablet Take 40 mg by mouth daily.      . bimatoprost (LUMIGAN) 0.03 % ophthalmic solution Place 1 drop into both eyes at bedtime.     . budesonide-formoterol (SYMBICORT) 160-4.5 MCG/ACT inhaler Inhale 2 puffs into the lungs 2 (two) times daily.    . citalopram (CELEXA) 10 MG  tablet Take 1 tablet (10 mg total) by mouth daily. 90 tablet 1  . finasteride (PROSCAR) 5 MG tablet Take 5 mg by mouth daily.    . furosemide (LASIX) 40 MG tablet Alternate '40mg'$  and '20mg'$  per day.  If your weight is up, then temporarily resume '40mg'$  each day. 90 tablet 3  . gabapentin (NEURONTIN) 100 MG capsule Take 1 capsule (100 mg total) by mouth 3 (three) times daily. 270 capsule 1  . HYDROcodone-acetaminophen (NORCO/VICODIN) 5-325 MG per tablet Take 1 tablet by mouth every 4 (four) hours as needed for moderate pain. 20 tablet 0  . insulin lispro (HUMALOG KWIKPEN) 100 UNIT/ML KiwkPen 11 units with breakfast, 11 units with with lunch and 9 units with supper (Patient taking differently: 18 units with breakfast, 20 units with with lunch and 14 units with supper) 11 pen 1  . Insulin Pen Needle 32G X 4 MM MISC Use three times daily for insulin admin 100 each 3  . Insulin Syringe-Needle U-100 30G X 1/2" 1 ML MISC 1 each by Does not apply route 3 (three) times daily. 200 each 5  . LEVEMIR 100 UNIT/ML injection INJECT 28 UNITS TOTAL UNDER THE SKIN AT BEDTIME. ADD 2 UNITS PER DAY UNTIL MORNING SUGAR IS LESS THAN 140 (Patient taking differently: INJECT 25 UNITS TOTAL UNDER THE SKIN AT BEDTIME. ADD 2 UNITS PER DAY UNTIL MORNING SUGAR IS LESS THAN 140) 3 vial 5  . metoprolol (TOPROL-XL) 50 MG 24 hr tablet Take 50 mg by mouth 2 (two) times daily.     . nitroGLYCERIN (NITROSTAT) 0.4 MG SL tablet Place 0.4 mg under the tongue every 5 (five) minutes as needed.      . nystatin-triamcinolone (MYCOLOG II) cream Apply 1 application topically 2 (two) times daily.     . Omega-3 Fatty Acids (FISH OIL) 1000 MG CAPS Take 1 capsule by mouth.    . potassium chloride (K-DUR) 10 MEQ tablet Take 3 tablets (30 mEq total) by mouth daily. 270 tablet 3  . predniSONE (DELTASONE) 10 MG tablet '10mg'$  a day for 1 week, then back down to '5mg'$  a day. (Patient taking differently: 5 mg. 5 mg alt with 10 mg daily)    . sitaGLIPtin (JANUVIA) 100  MG tablet Take 1 tablet (100 mg total) by mouth daily. 90 tablet 0  . tamsulosin (FLOMAX) 0.4 MG CAPS capsule TAKE 1 CAPSULE DAILY 90 capsule 1  . tiotropium (SPIRIVA) 18 MCG  inhalation capsule Place 18 mcg into inhaler and inhale daily.      Marland Kitchen voriconazole (VFEND) 200 MG tablet Take 1 tablet (200 mg total) by mouth every 12 (twelve) hours. 14 tablet 0   No current facility-administered medications for this visit.    Allergies  Allergen Reactions  . Contrast Media [Iodinated Diagnostic Agents] Shortness Of Breath  . Morphine And Related Other (See Comments)    Hallucinations   . Niacin And Related Dermatitis    Family History  Problem Relation Age of Onset  . Heart disease Maternal Grandmother   . Diabetes Maternal Grandmother   . Cancer Neg Hx   . Stroke Neg Hx   . Heart disease Mother     History   Social History  . Marital Status: Married    Spouse Name: N/A  . Number of Children: N/A  . Years of Education: N/A   Occupational History  . Not on file.   Social History Main Topics  . Smoking status: Former Smoker -- 2.00 packs/day for 40 years    Types: Cigarettes    Quit date: 05/24/1990  . Smokeless tobacco: Former Systems developer    Quit date: 05/24/1990  . Alcohol Use: No  . Drug Use: No  . Sexual Activity: Not Currently   Other Topics Concern  . Not on file   Social History Narrative     Constitutional: Pt reports fatigue. Denies fever, malaise, headache or abrupt weight changes.  Respiratory: Pt reports shortness of breath. Denies difficulty breathing, cough or sputum production.   Cardiovascular: Denies chest pain, chest tightness, palpitations or swelling in the hands or feet.  GU: Denies urgency, frequency, pain with urination, burning sensation, blood in urine, odor or discharge. Musculoskeletal: Pt reports generalized weakness. Denies decrease in range of motion, difficulty with gait, muscle pain or joint pain and swelling.  Skin: Denies redness, rashes,  lesions or ulcercations.  Neurological: Pt has problems with balance. Denies dizziness, difficulty with memory, difficulty with speech.   No other specific complaints in a complete review of systems (except as listed in HPI above).  Objective:   Physical Exam  BP 128/78 mmHg  Pulse 63  Temp(Src) 98.1 F (36.7 C) (Oral)  Wt 196 lb (88.905 kg)  SpO2 98% Wt Readings from Last 3 Encounters:  10/17/14 196 lb (88.905 kg)  10/11/14 199 lb (90.266 kg)  10/09/14 197 lb (89.359 kg)    General: Appears his stated age, chronically ill appearing in NAD. Cardiovascular: Normal rate and rhythm. S1,S2 noted.  No murmur, rubs or gallops noted. No JVD. Trace BLE pitting edema.  Pulmonary/Chest: Slightly increased effort and positive vesicular breath sounds. No respiratory distress. No wheezes, rales or ronchi noted.  Abdomen: distended but soft and nontender. Normal bowel sounds, no bruits noted. No distention or masses noted. Liver, spleen and kidneys non palpable. Musculoskeletal: Using walker for assistance with gait. Neurological: Alert and oriented.   BMET    Component Value Date/Time   NA 144 10/09/2014 2249   NA 137 08/05/2014 1839   K 4.3 10/09/2014 2249   K 3.6 08/05/2014 1839   CL 106 10/09/2014 2249   CL 106 08/05/2014 1839   CO2 30 10/09/2014 2249   CO2 23 08/05/2014 1839   GLUCOSE 122* 10/09/2014 2249   GLUCOSE 235* 08/05/2014 1839   BUN 22* 10/09/2014 2249   BUN 19 08/05/2014 1839   CREATININE 1.33* 10/09/2014 2249   CREATININE 1.20 08/05/2014 1839   CALCIUM 8.9 10/09/2014 2249  CALCIUM 8.6* 08/05/2014 1839   GFRNONAA 49* 10/09/2014 2249   GFRNONAA 57* 08/05/2014 1839   GFRAA 57* 10/09/2014 2249   GFRAA >60 08/05/2014 1839    Lipid Panel     Component Value Date/Time   CHOL 107 10/04/2014 0940   TRIG 83.0 10/04/2014 0940   HDL 43.10 10/04/2014 0940   CHOLHDL 2 10/04/2014 0940   VLDL 16.6 10/04/2014 0940   LDLCALC 47 10/04/2014 0940    CBC    Component  Value Date/Time   WBC 21.0* 10/09/2014 2249   WBC 17.2* 08/05/2014 1839   RBC 5.41 10/09/2014 2249   RBC 4.86 08/05/2014 1839   HGB 14.1 10/09/2014 2249   HGB 13.3 08/05/2014 1839   HCT 45.2 10/09/2014 2249   HCT 40.1 08/05/2014 1839   PLT 244 10/09/2014 2249   PLT 246 08/05/2014 1839   MCV 83.5 10/09/2014 2249   MCV 83 08/05/2014 1839   MCH 26.1 10/09/2014 2249   MCH 27.3 08/05/2014 1839   MCHC 31.2* 10/09/2014 2249   MCHC 33.1 08/05/2014 1839   RDW 16.4* 10/09/2014 2249   RDW 16.3* 08/05/2014 1839   LYMPHSABS 1.7 10/09/2014 2249   LYMPHSABS 2.3 08/05/2014 1839   MONOABS 2.0* 10/09/2014 2249   MONOABS 2.0* 08/05/2014 1839   EOSABS 0.0 10/09/2014 2249   EOSABS 0.1 08/05/2014 1839   BASOSABS 0.1 10/09/2014 2249   BASOSABS 0.1 08/05/2014 1839    Hgb A1C Lab Results  Component Value Date   HGBA1C 8.3* 10/09/2014         Assessment & Plan:   Hospital follow up for urosepsis and CHF exacerbation:  He will finish his course of Voriconazole He will keep his follow up appt with Dr. Erlene Quan He will continue his Lasix, Prednisone and inhalers Advised him to only wear the O2 if he is going away from home or if sats < 90% He will continue wearing his CPAP at night He will start working with PT on strengthening his muscles  DM 2:  I will start managing this He will continue his current regimen at this time I will see him back in 3 months

## 2014-10-17 NOTE — Progress Notes (Signed)
Pre visit review using our clinic review tool, if applicable. No additional management support is needed unless otherwise documented below in the visit note. 

## 2014-10-18 NOTE — Patient Instructions (Signed)

## 2014-10-19 ENCOUNTER — Other Ambulatory Visit: Payer: Self-pay | Admitting: Family

## 2014-10-22 NOTE — Discharge Instructions (Signed)
Take Eliquis 2.5 mg twice a day until 10/17/2014. Increase Eliquis dose to 5 mg twice a day starting the morning of 10/18/2014. Pt to use oxygen with CPAP at night

## 2014-10-25 ENCOUNTER — Other Ambulatory Visit: Payer: Self-pay | Admitting: Family

## 2014-10-28 ENCOUNTER — Telehealth: Payer: Self-pay | Admitting: *Deleted

## 2014-10-28 ENCOUNTER — Telehealth: Payer: Self-pay

## 2014-10-28 NOTE — Telephone Encounter (Signed)
Spoke to [name]--verbal order given as instructed

## 2014-10-28 NOTE — Telephone Encounter (Signed)
Pt c/o swelling: STAT is pt has developed SOB within 24 hours  1. How long have you been experiencing swelling? Over night gained about 4 lbs (last night)  2. Where is the swelling located? Feet   3.  Are you currently taking a "fluid pill"? Yes doubled it, wife gave him 40 mg of Lasix  (he just took it)   4.  Are you currently SOB? No   5.  Have you traveled recently? No   **Home health nurse is coming to see the patient but was advise to call us as well.

## 2014-10-28 NOTE — Telephone Encounter (Signed)
Are verbal orders sufficient? If so, ok for home health for PT to eval and treat. If not, I will put in order.

## 2014-10-28 NOTE — Telephone Encounter (Signed)
S/w pt who states he is feeling great, no SOB. States he took extra lasix pill this morning, weighed again and has lost 2 or the 4lbs he reported he had gained. Informed patient to continue with low sodium diet, monitor fluid intake, weigh every day and call if he gains 3+ lbs overnight or 5 in a week. Pt verbalized understanding with no further questions.

## 2014-10-28 NOTE — Telephone Encounter (Addendum)
Amy nurse with [facility] left v/m; pt fell several weeks ago; pt was xrayed but is having limited motion of shoulder. Amy request order for home health PT to eval and treat. Amy request cb.

## 2014-11-04 ENCOUNTER — Other Ambulatory Visit: Payer: Self-pay

## 2014-11-04 MED ORDER — SITAGLIPTIN PHOSPHATE 100 MG PO TABS: 100.0000 mg | ORAL_TABLET | Freq: Every day | ORAL | Status: DC

## 2014-11-04 NOTE — Telephone Encounter (Signed)
Pt is out of [place] and request # 14 to [facility] [address]. And #90 x [facility]. Advised pt refills done as requested and per protocol. Pt will cb to schedule 01/2015 f/u appt.

## 2014-11-05 ENCOUNTER — Other Ambulatory Visit: Payer: Self-pay | Admitting: Internal Medicine

## 2014-11-05 ENCOUNTER — Other Ambulatory Visit: Payer: Self-pay

## 2014-11-05 DIAGNOSIS — F419 Anxiety disorder, unspecified: Secondary | ICD-10-CM

## 2014-11-05 MED ORDER — ALPRAZOLAM 0.25 MG PO TABS: 0.2500 mg | ORAL_TABLET | Freq: Every evening | ORAL | Status: DC | PRN

## 2014-11-06 ENCOUNTER — Ambulatory Visit
Admission: RE | Admit: 2014-11-06 | Discharge: 2014-11-06 | Disposition: A | Discharge status: Home / Self Care | Payer: Medicare Other | Source: Ambulatory Visit | Attending: Urology | Admitting: Urology

## 2014-11-06 ENCOUNTER — Other Ambulatory Visit: Payer: Self-pay

## 2014-11-06 DIAGNOSIS — N201 Calculus of ureter: Secondary | ICD-10-CM

## 2014-11-06 DIAGNOSIS — N200 Calculus of kidney: Secondary | ICD-10-CM | POA: Insufficient documentation

## 2014-11-06 MED ORDER — SITAGLIPTIN PHOSPHATE 100 MG PO TABS: 100.0000 mg | ORAL_TABLET | Freq: Every day | ORAL | Status: DC

## 2014-11-06 NOTE — Telephone Encounter (Signed)
[facility] got [facility] from [facility] needs supervisory physician; resent rx [facility] by Dr Deborra Medina pt advised done.

## 2014-11-08 ENCOUNTER — Ambulatory Visit (INDEPENDENT_AMBULATORY_CARE_PROVIDER_SITE_OTHER): Payer: Medicare Other | Admitting: Urology

## 2014-11-08 ENCOUNTER — Encounter: Payer: Self-pay | Admitting: Urology

## 2014-11-08 VITALS — BP 143/85 | HR 64 | Temp 97.6°F | Resp 16 | Ht 65.0 in | Wt 197.8 lb

## 2014-11-08 DIAGNOSIS — N200 Calculus of kidney: Secondary | ICD-10-CM | POA: Diagnosis not present

## 2014-11-08 DIAGNOSIS — N390 Urinary tract infection, site not specified: Secondary | ICD-10-CM

## 2014-11-08 DIAGNOSIS — N400 Enlarged prostate without lower urinary tract symptoms: Secondary | ICD-10-CM

## 2014-11-10 ENCOUNTER — Encounter: Payer: Self-pay | Admitting: Urology

## 2014-11-10 NOTE — Progress Notes (Signed)
11/08/14  [name]. 1932/01/02 [id number]  Referring provider: Jearld Fenton, NP [address] [place], [place] [address]  Chief Complaint  Patient presents with  . Follow-up    Cystoscopy,left ureterscopy with laser lithotripsy.  Basket extraction of stone fragments and left ureteral sten exchaange    HPI: 81 yo M with severe medical comorbidities including CHF, h/o CVA, COPD, DM who with recurrent bacterial and candidial sepsis since 01/2014, urinary retention, phimosis s/p office circ 04/2014, and emergent left ureteral stent placement on 04/24/14 in the setting of sepsis for obstucting ureteral stone. He was ultimately was taken to the OR on 10/09/14 for L URS, LL, stent placement for treatment of his obstructing ureteral stone only. He tolerated the procedure well but was readmitted later that evening with fluid overload and concern for possible sepsis which was ultimately ruled out. He was discharged home a few days later on oxygen.  He did remove his own stent without complication.  Today, he looks and feels quite well. He denies any flank pain or urinary symptoms. Since having the stent removed, he notes that his urine has now cleared up and is no longer cloudy. No recent fevers or chills. He is only using oxygen as needed.  Follow-up renal ultrasound shows no hydronephrosis bilaterally. He continues to have a large left lower pole stone burden.     PMH: Past Medical History  Diagnosis Date  . MI     a. tx'd w/ TPA at [facility] 01/29/1996  . HYPERTENSION   . HYPERLIPIDEMIA   . CAD     a. Myoview 06/2005: EF 50%, scar @ apex, mild peri-infarct ischemia  . Chronic atrial fibrillation     a. since 2006; b. on warfarin  . DM   . History of kidney stones   . Neuropathy of both feet   . Chronic diastolic CHF (congestive heart failure)     a. echo 04/2006: EF lower limits of nl, mod LVH, mild aortic root dilatation, & mild MR, biatrial enlargement; b. echo  04/2013: EF 60%, mod dilated LA, mild MR & TR, mod pulm HTN w/ RV systolic pressure 53, c. echo 04/21/14: EF 55-60%, unable to exclude WMA, severely dilated LA 6.6 cm, nl RVSP, mildly dilated aortic root  . Kidney stone     a. s/p left ureteral stenting 04/24/14  . Arrhythmia   . COPD (chronic obstructive pulmonary disease)   . GERD (gastroesophageal reflux disease)   . Cancer     skin  . Arthritis   . CHF (congestive heart failure)   . CVA 5784,6962    x2  . Falls   . Poor balance   . Pulmonary fibrosis     Surgical History: Past Surgical History  Procedure Laterality Date  . Tonsillectomy and adenoidectomy  1959  . Carotid stent insertion  1997  . Cardiac catheterization  1997    [facility]  . Coronary angioplasty  1997    s/p stent placement x 2   . Kidney surgery  05/2013    s/p stent placement   . Bladder surgery      stent placement   . Circumcision  2016  . Stents ureters Bilateral   . Ureteroscopy with holmium laser lithotripsy Left 10/09/2014    Procedure: URETEROSCOPY WITH HOLMIUM LASER LITHOTRIPSY;  Surgeon: Hollice Espy, MD;  Location: [facility] ORS;  Service: Urology;  Laterality: Left;  . Cystoscopy w/ ureteral stent removal Left 10/09/2014  Procedure: CYSTOSCOPY WITH STENT REMOVAL;  Surgeon: Hollice Espy, MD;  Location: [facility] ORS;  Service: Urology;  Laterality: Left;  . Cystoscopy with stent placement Left 10/09/2014    Procedure: CYSTOSCOPY WITH STENT PLACEMENT;  Surgeon: Hollice Espy, MD;  Location: [facility] ORS;  Service: Urology;  Laterality: Left;    Home Medications:    Medication List       This list is accurate as of: 11/08/14 11:59 PM.  Always use your most recent med list.               acetaminophen 650 MG CR tablet  Commonly known as:  TYLENOL  Take 650 mg by mouth every 8 (eight) hours as needed.     albuterol (2.5 MG/3ML) 0.083% NEBU 3 mL, albuterol (5 MG/ML) 0.5% NEBU 0.5 mL  Inhale 1 mg into the lungs.     PROAIR HFA 108 (90 BASE) MCG/ACT  inhaler  Generic drug:  albuterol  INHALE 1 TO 2 PUFFS EVERY 4 HOURS AS NEEDED     ALPRAZolam 0.25 MG tablet  Commonly known as:  XANAX  Take 1 tablet (0.25 mg total) by mouth at bedtime as needed for anxiety.     apixaban 2.5 MG Tabs tablet  Commonly known as:  ELIQUIS  Take 1 tablet (2.5 mg total) by mouth 2 (two) times daily. Till 10/17/14 and then resume 5 mg bid from 10/18/14     apixaban 5 MG Tabs tablet  Commonly known as:  ELIQUIS  Take 1 tablet (5 mg total) by mouth 2 (two) times daily. Take 2.5 mg twice a day till 10/17/14 and from 10/18/14 start 5 mg bid (your original dose)     atorvastatin 40 MG tablet  Commonly known as:  LIPITOR  TAKE 1 TABLET DAILY     bimatoprost 0.03 % ophthalmic solution  Commonly known as:  LUMIGAN  Place 1 drop into both eyes at bedtime.     budesonide-formoterol 160-4.5 MCG/ACT inhaler  Commonly known as:  SYMBICORT  Inhale 2 puffs into the lungs 2 (two) times daily.     citalopram 10 MG tablet  Commonly known as:  CELEXA  Take 1 tablet (10 mg total) by mouth daily.     finasteride 5 MG tablet  Commonly known as:  PROSCAR  Take 5 mg by mouth daily.     Fish Oil 1000 MG Caps  Take 1 capsule by mouth.     furosemide 40 MG tablet  Commonly known as:  LASIX  Alternate '40mg'$  and '20mg'$  per day.  If your weight is up, then temporarily resume '40mg'$  each day.     gabapentin 100 MG capsule  Commonly known as:  NEURONTIN  Take 1 capsule (100 mg total) by mouth 3 (three) times daily.     HYDROcodone-acetaminophen 5-325 MG per tablet  Commonly known as:  NORCO/VICODIN  Take 1 tablet by mouth every 4 (four) hours as needed for moderate pain.     insulin lispro 100 UNIT/ML KiwkPen  Commonly known as:  HUMALOG KWIKPEN  11 units with breakfast, 11 units with with lunch and 9 units with supper     Insulin Pen Needle 32G X 4 MM Misc  Use three times daily for insulin admin     Insulin Syringe-Needle U-100 30G X 1/2" 1 ML Misc  1 each by Does not  apply route 3 (three) times daily.     LEVEMIR 100 UNIT/ML injection  Generic drug:  insulin detemir  INJECT 28 UNITS  TOTAL UNDER THE SKIN AT BEDTIME. ADD 2 UNITS PER DAY UNTIL MORNING SUGAR IS LESS THAN 140     metoprolol succinate 50 MG 24 hr tablet  Commonly known as:  TOPROL-XL  Take 50 mg by mouth 2 (two) times daily.     nitroGLYCERIN 0.4 MG SL tablet  Commonly known as:  NITROSTAT  Place 0.4 mg under the tongue every 5 (five) minutes as needed.     nystatin-triamcinolone cream  Commonly known as:  MYCOLOG II  Apply 1 application topically 2 (two) times daily.     potassium chloride 10 MEQ tablet  Commonly known as:  K-DUR  Take 3 tablets (30 mEq total) by mouth daily.     predniSONE 10 MG tablet  Commonly known as:  DELTASONE  '10mg'$  a day for 1 week, then back down to '5mg'$  a day.     sitaGLIPtin 100 MG tablet  Commonly known as:  JANUVIA  Take 1 tablet (100 mg total) by mouth daily.     tamsulosin 0.4 MG Caps capsule  Commonly known as:  FLOMAX  TAKE 1 CAPSULE DAILY     tiotropium 18 MCG inhalation capsule  Commonly known as:  SPIRIVA  Place 18 mcg into inhaler and inhale daily.        Allergies:  Allergies  Allergen Reactions  . Contrast Media [Iodinated Diagnostic Agents] Shortness Of Breath  . Morphine And Related Other (See Comments)    Hallucinations   . Niacin And Related Dermatitis    Family History: Family History  Problem Relation Age of Onset  . Heart disease Maternal Grandmother   . Diabetes Maternal Grandmother   . Cancer Neg Hx   . Stroke Neg Hx   . Heart disease Mother     Social History:  reports that he quit smoking about 24 years ago. His smoking use included Cigarettes. He has a 80 pack-year smoking history. He quit smokeless tobacco use about 24 years ago. He reports that he does not drink alcohol or use illicit drugs.  Physical Exam: BP 143/85 mmHg  Pulse 64  Temp(Src) 97.6 F (36.4 C) (Oral)  Resp 16  Ht '5\' 5"'$  (1.651 m)   Wt 197 lb 12.8 oz (89.721 kg)  BMI 32.92 kg/m2  Constitutional:  Alert and oriented, No acute distress.  Obese, presents to office today with his wife. HEENT: [place] AT, moist mucus membranes.  Trachea midline, no masses. Cardiovascular: No clubbing, cyanosis.  + bilateral edema, improved. Respiratory: Normal respiratory effort, no increased work of breathing.  No o2 today. GI: Abdomen is soft, nontender, nondistended, no abdominal masses GU: No CVA tenderness.  Skin: No rashes, bruises or suspicious lesions. Neurologic: Grossly intact, no focal deficits, moving all 4 extremities. Psychiatric: Normal mood and affect.  Laboratory Data: Lab Results  Component Value Date   WBC 21.0* 10/09/2014   HGB 14.1 10/09/2014   HCT 45.2 10/09/2014   MCV 83.5 10/09/2014   PLT 244 10/09/2014    Lab Results  Component Value Date   CREATININE 1.33* 10/09/2014    Lab Results  Component Value Date   PSA 4.1* 04/24/2013    No results found for: TESTOSTERONE  Lab Results  Component Value Date   HGBA1C 8.3* 10/09/2014     Pertinent Imaging:    Study Result     CLINICAL DATA: Assess for residual hydronephrosis. History of left ureteroscopy.  EXAM: RENAL / URINARY TRACT ULTRASOUND COMPLETE  COMPARISON: CT of the abdomen and pelvis on 06/03/2014  FINDINGS: Right Kidney:  Length: 10.8 cm. Echogenicity is normal. Mild cortical thickening noted. No hydronephrosis or focal mass.  Left Kidney:  Length: 11.3 cm. Lower pole intrarenal calculus is 0.6 cm. No hydronephrosis. No focal mass. Mild renal parenchymal thinning.  Bladder:  Appears normal for degree of bladder distention. Bilateral ureteral jets are noted.  IMPRESSION: 1. No hydronephrosis. 2. Mild renal parenchymal thinning bilaterally. 3. Intrarenal calculus in the lower pole left kidney.   Electronically Signed  By: Nolon Nations M.D.  On: 11/06/2014 13:57     Assessment & Plan:  81 year-old  male with multiple comorbidities, poor health status post recent stroke and multiple episodes of urinary sepsis status post LEFT ureteral stent placement on 04/24/2014 for an obstructing ureteral stone, stent exchange on 07/15/14, and most recently L URS, LL on 10/09/14  for definitive management of his ureteral stone.  He continues to have a large left lower pole stone burden, however, given his multiple comorbidities, I do not feel that he is a good candidate for treatment at this time given concern for sepsis and prolonged anesthesia. As such, we'll continue to monitor.     1. Nephrolithiasis Persistent left lower pole stone burden. Patient in poor health therefore we discussed deferring further treatment of this unless continues to have recurrent infection/sepsis. He is otherwise asymptomatic. Patient and his wife continue to be agreeable with this plan.  2. BPH (benign prostatic hyperplasia) Continue Flomax/ finasteride.  3. Recurrent UTI As above  Return in about 6 months (around 05/11/2015) for f/u RUS results.  Hollice Espy, MD  [facility] [address], [place], [place] [address] [phone]

## 2014-11-13 ENCOUNTER — Ambulatory Visit: Payer: Medicare Other | Admitting: Endocrinology

## 2014-11-18 MED ORDER — SITAGLIPTIN PHOSPHATE 100 MG PO TABS: 100.0000 mg | ORAL_TABLET | Freq: Every day | ORAL | Status: DC

## 2014-11-18 NOTE — Addendum Note (Signed)
Addended by: Helene Shoe on: 11/18/2014 02:18 PM   Modules accepted: Orders

## 2014-11-18 NOTE — Telephone Encounter (Signed)
Pt has not received [facility] yet; pt is out of Januvia today; advised pt rx sent electronically on 11/06/14 [facility] and pt will contact pharmacy and if the pharmacy does not have rx will have [facility] contact [place]. If pt wants small quantity sent to local pharmacy pt will cb after speaking with [facility].

## 2014-11-18 NOTE — Telephone Encounter (Addendum)
Pt left v/m that [facility] is going to send [facility]ia to pt but since pt is out of med he request # [phone] sent to [place] with Mrs Cuellar to let her know med was sent electronically to [facility] [address] pts wife voiced understanding.

## 2014-11-26 ENCOUNTER — Ambulatory Visit: Payer: Medicare Other | Admitting: Family

## 2014-12-04 ENCOUNTER — Telehealth: Payer: Self-pay

## 2014-12-04 NOTE — Telephone Encounter (Signed)
Amy nurse with [facility] left v/m requesting verbal order for home health OT to assist pt with range of motion in shoulder; pt has already had physical therapy but Amy thinks OT might help more.Please advise.

## 2014-12-04 NOTE — Telephone Encounter (Signed)
Spoke with Amy, verbal OK order given as instructed

## 2014-12-04 NOTE — Telephone Encounter (Signed)
[place] for verbal order for OT

## 2014-12-12 DIAGNOSIS — I5032 Chronic diastolic (congestive) heart failure: Secondary | ICD-10-CM | POA: Insufficient documentation

## 2014-12-18 ENCOUNTER — Ambulatory Visit (INDEPENDENT_AMBULATORY_CARE_PROVIDER_SITE_OTHER): Payer: Medicare Other | Admitting: Cardiovascular Disease

## 2014-12-18 ENCOUNTER — Ambulatory Visit: Payer: Medicare Other | Admitting: Cardiovascular Disease

## 2014-12-18 ENCOUNTER — Encounter: Payer: Self-pay | Admitting: Cardiovascular Disease

## 2014-12-18 VITALS — BP 110/82 | HR 62 | Ht 66.0 in | Wt 203.2 lb

## 2014-12-18 DIAGNOSIS — I4891 Unspecified atrial fibrillation: Secondary | ICD-10-CM

## 2014-12-18 DIAGNOSIS — E1165 Type 2 diabetes mellitus with hyperglycemia: Secondary | ICD-10-CM

## 2014-12-18 DIAGNOSIS — I2510 Atherosclerotic heart disease of native coronary artery without angina pectoris: Secondary | ICD-10-CM | POA: Diagnosis not present

## 2014-12-18 DIAGNOSIS — I080 Rheumatic disorders of both mitral and aortic valves: Secondary | ICD-10-CM | POA: Diagnosis not present

## 2014-12-18 DIAGNOSIS — I10 Essential (primary) hypertension: Secondary | ICD-10-CM

## 2014-12-18 DIAGNOSIS — E785 Hyperlipidemia, unspecified: Secondary | ICD-10-CM

## 2014-12-18 DIAGNOSIS — I5033 Acute on chronic diastolic (congestive) heart failure: Secondary | ICD-10-CM

## 2014-12-18 MED ORDER — TORSEMIDE 20 MG PO TABS: 40.0000 mg | ORAL_TABLET | Freq: Two times a day (BID) | ORAL | Status: DC

## 2014-12-18 NOTE — Patient Instructions (Addendum)
Weight is elevated 20 pounds  Hold the lasix/furosemide Start the torsemide 20 mg twice a day (morning and after lunch)  Increase up to torsemide 40 mg twice a day if weight does not start to decrease   Call the office  Next week with a weight  Please call us if you have new issues that need to be addressed before your next appt.  Your physician wants you to follow-up in: 3 weeks

## 2014-12-18 NOTE — Progress Notes (Signed)
Patient ID: [name]., male    DOB: [birth year], 81 y.o.   MRN: [id number]  HPI Comments: [name] is a pleasant 81 year old gentleman, who has a history of diabetes type 2, coronary artery disease, status post myocardial infarction treated with TPA at [facility] in January 29, 1996.   chronic atrial fibrillation since 2006, on warfarin. Walks with a cane. 2119 had a cva, uncertain if he was on anticoagulation at the time History off falls.  Seen by Dr. Raul [name], pulmonary. Also seen by CHF clinic. Several admissions to the hospital dating back to December 2014 with urine output function, urinary tract infections, status post stent placement by Dr. Elenor [name]. He presents today for evaluation of recent 20 pound weight gain. Poorly controlled diabetes  He reports that he is been recently taking Lasix 40 mg daily. Despite this his weight is now up 20 pounds from his prior clinic visit March 2016. He does drink significant fluids, mouth is always dry. Legs are tight, abdomen swollen, some shortness of breath particularly with exertion. He feels the Lasix is not working well for him.  EKG on today's visit shows atrial fibrillation with ventricular rate 62 bpm, unable to exclude old anterior MI, anterolateral ST abnormality  Other past medical history history of strokes documented on MRI. History of fall, fell backwards when walking with his walker  hospital 06/03/2014 with discharge on the 26th with urinary tract infection requiring antibiotics.   hospital admission 05/02/2014 with acute stroke. He was off warfarin for circumcision in early January 2016. He did have Lovenox bridge MRI showed multiple embolic strokes, left parietal, occipital areas. He was seen by neurology. He reports having continued vision problems. INR was 2 and he was continued on his warfarin. He is doing home physical therapy. At the time of discharge he was started on diltiazem. He does have chronic lower  extremity edema. He has not received the diltiazem yet and is waiting for mail-order.  Recent carotid ultrasound showing bilateral mild plaque dated 05/03/2014 Echocardiogram 05/03/2014 showing normal LV systolic function, normal right ventricular systolic pressure MRI brain showing multiple punctate foci acute infarction left parietal, left occipital, left frontal lobes likely embolic, chronic right occipital infarct  EKG on today's visit shows age fibrillation with ventricular rate 75 bpm, old anterior MI, inferior MI INR 2 days ago was 1.6. INR today 1.8  Hemoglobin A1c elevated at 9.6  hospital admission January 26 2014 with discharge October 20 with urosepsis, bladder outflow obstruction.cultures grew [facility] was called to place a Foley catheter. Initial creatinine 1.56, BUN 25.  Echocardiogram 01/25/2014 showing moderate pulmonary hypertension, normal ejection fraction Notes indicate he had antibiotics, diuresis through his hospital course with improvement of his renal function down to creatinine 1.26. Creatinine was checked 2 days ago and was up to 1.4 with BUN 29 He had a Foley catheter for 10 days which was removed  He fell in January 2015 after taking a nap, he fell into his dresser drawer. Significant trauma to his left arm He fell possibly in April 2015the details are unavailable. Did not hurt himself [place] again recently while in the bathroom. Lost his footing, fell backwards, broke the toilet lid, large left flank hematoma  Myoview in March 2007 showed an ejection fraction of 50% with an old scar at the apex and mild peri-infarct ischemia.   echocardiogram in January 2008 showed an ejection fraction at the lower limits of normal with moderate LVH, mild aortic root dilatation, and  mild mitral regurgitation.  There was biatrial enlargement.   echocardiogram January 2015 showing ejection fraction 60%, moderately dilated left atrium, mild MR and TR, moderate pulmonary  hypertension with right ventricular systolic pressure 53   Previous carotid Dopplers in January of 2008 showed 0-39% stenosis bilaterally. Carotid ultrasound may 2009 showing mild bilateral plaque, no significant stenoses  Allergies  Allergen Reactions  . Contrast Media [Iodinated Diagnostic Agents] Shortness Of Breath  . Morphine And Related Other (See Comments)    Hallucinations   . Niacin And Related Dermatitis    Outpatient Encounter Prescriptions as of 12/18/2014  Medication Sig  . acetaminophen (TYLENOL) 650 MG CR tablet Take 650 mg by mouth every 8 (eight) hours as needed.    Marland Kitchen albuterol (2.5 MG/3ML) 0.083% NEBU 3 mL, albuterol (5 MG/ML) 0.5% NEBU 0.5 mL Inhale 1 mg into the lungs.  . ALPRAZolam (XANAX) 0.25 MG tablet Take 1 tablet (0.25 mg total) by mouth at bedtime as needed for anxiety.  Marland Kitchen apixaban (ELIQUIS) 5 MG TABS tablet Take 5 mg by mouth 2 (two) times daily.  Marland Kitchen atorvastatin (LIPITOR) 40 MG tablet TAKE 1 TABLET DAILY  . bimatoprost (LUMIGAN) 0.03 % ophthalmic solution Place 1 drop into both eyes at bedtime.   . budesonide-formoterol (SYMBICORT) 160-4.5 MCG/ACT inhaler Inhale 2 puffs into the lungs 2 (two) times daily.  . citalopram (CELEXA) 10 MG tablet Take 1 tablet (10 mg total) by mouth daily.  . finasteride (PROSCAR) 5 MG tablet Take 5 mg by mouth daily.  Marland Kitchen gabapentin (NEURONTIN) 100 MG capsule Take 1 capsule (100 mg total) by mouth 3 (three) times daily.  . insulin lispro (HUMALOG KWIKPEN) 100 UNIT/ML KiwkPen 11 units with breakfast, 11 units with with lunch and 9 units with supper  . Insulin Pen Needle 32G X 4 MM MISC Use three times daily for insulin admin  . Insulin Syringe-Needle U-100 30G X 1/2" 1 ML MISC 1 each by Does not apply route 3 (three) times daily.  Marland Kitchen LEVEMIR 100 UNIT/ML injection INJECT 28 UNITS TOTAL UNDER THE SKIN AT BEDTIME. ADD 2 UNITS PER DAY UNTIL MORNING SUGAR IS LESS THAN 140  . metoprolol (TOPROL-XL) 50 MG 24 hr tablet Take 50 mg by mouth 2  (two) times daily.   . nitroGLYCERIN (NITROSTAT) 0.4 MG SL tablet Place 0.4 mg under the tongue every 5 (five) minutes as needed.    . nystatin-triamcinolone (MYCOLOG II) cream Apply 1 application topically 2 (two) times daily.   . Omega-3 Fatty Acids (FISH OIL) 1000 MG CAPS Take 1 capsule by mouth 3 (three) times daily.   . potassium chloride (K-DUR) 10 MEQ tablet Take 3 tablets (30 mEq total) by mouth daily.  . predniSONE (DELTASONE) 10 MG tablet '10mg'$  a day for 1 week, then back down to '5mg'$  a day. (Patient taking differently: 5 mg. 5 mg alt with 10 mg daily)  . PROAIR HFA 108 (90 BASE) MCG/ACT inhaler INHALE 1 TO 2 PUFFS EVERY 4 HOURS AS NEEDED  . sitaGLIPtin (JANUVIA) 100 MG tablet Take 1 tablet (100 mg total) by mouth daily.  . tamsulosin (FLOMAX) 0.4 MG CAPS capsule TAKE 1 CAPSULE DAILY  . tiotropium (SPIRIVA) 18 MCG inhalation capsule Place 18 mcg into inhaler and inhale daily.    . [DISCONTINUED] furosemide (LASIX) 40 MG tablet Alternate '40mg'$  and '20mg'$  per day.  If your weight is up, then temporarily resume '40mg'$  each day.  . torsemide (DEMADEX) 20 MG tablet Take 2 tablets (40 mg total) by mouth  2 (two) times daily.  . [DISCONTINUED] apixaban (ELIQUIS) 2.5 MG TABS tablet Take 1 tablet (2.5 mg total) by mouth 2 (two) times daily. Till 10/17/14 and then resume 5 mg bid from 10/18/14 (Patient not taking: Reported on 12/18/2014)  . [DISCONTINUED] apixaban (ELIQUIS) 5 MG TABS tablet Take 1 tablet (5 mg total) by mouth 2 (two) times daily. Take 2.5 mg twice a day till 10/17/14 and from 10/18/14 start 5 mg bid (your original dose) (Patient not taking: Reported on 12/18/2014)  . [DISCONTINUED] HYDROcodone-acetaminophen (NORCO/VICODIN) 5-325 MG per tablet Take 1 tablet by mouth every 4 (four) hours as needed for moderate pain. (Patient not taking: Reported on 12/18/2014)   No facility-administered encounter medications on file as of 12/18/2014.    Past Medical History  Diagnosis Date  . MI     a. tx'd w/ TPA at  [facility] 01/29/1996  . HYPERTENSION   . HYPERLIPIDEMIA   . CAD     a. Myoview 06/2005: EF 50%, scar @ apex, mild peri-infarct ischemia  . Chronic atrial fibrillation     a. since 2006; b. on warfarin  . DM   . History of kidney stones   . Neuropathy of both feet   . Chronic diastolic CHF (congestive heart failure)     a. echo 04/2006: EF lower limits of nl, mod LVH, mild aortic root dilatation, & mild MR, biatrial enlargement; b. echo 04/2013: EF 60%, mod dilated LA, mild MR & TR, mod pulm HTN w/ RV systolic pressure 53, c. echo 04/21/14: EF 55-60%, unable to exclude WMA, severely dilated LA 6.6 cm, nl RVSP, mildly dilated aortic root  . Kidney stone     a. s/p left ureteral stenting 04/24/14  . Arrhythmia   . COPD (chronic obstructive pulmonary disease)   . GERD (gastroesophageal reflux disease)   . Cancer     skin  . Arthritis   . CHF (congestive heart failure)   . CVA 4287,6811    x2  . Falls   . Poor balance   . Pulmonary fibrosis     Past Surgical History  Procedure Laterality Date  . Tonsillectomy and adenoidectomy  1959  . Carotid stent insertion  1997  . Cardiac catheterization  1997    [facility]  . Coronary angioplasty  1997    s/p stent placement x 2   . Kidney surgery  05/2013    s/p stent placement   . Bladder surgery      stent placement   . Circumcision  2016  . Stents ureters Bilateral   . Ureteroscopy with holmium laser lithotripsy Left 10/09/2014    Procedure: URETEROSCOPY WITH HOLMIUM LASER LITHOTRIPSY;  Surgeon: Hollice Espy, MD;  Location: [facility] ORS;  Service: Urology;  Laterality: Left;  . Cystoscopy w/ ureteral stent removal Left 10/09/2014    Procedure: CYSTOSCOPY WITH STENT REMOVAL;  Surgeon: Hollice Espy, MD;  Location: [facility] ORS;  Service: Urology;  Laterality: Left;  . Cystoscopy with stent placement Left 10/09/2014    Procedure: CYSTOSCOPY WITH STENT PLACEMENT;  Surgeon: Hollice Espy, MD;  Location: [facility] ORS;  Service: Urology;  Laterality: Left;     Social History  reports that he quit smoking about 24 years ago. His smoking use included Cigarettes. He has a 80 pack-year smoking history. He quit smokeless tobacco use about 24 years ago. He reports that he does not drink alcohol or use illicit drugs.  Family History family history includes Diabetes in his maternal grandmother; Heart disease in his maternal  grandmother and mother. There is no history of Cancer or Stroke.  Review of Systems  Constitutional: Positive for unexpected weight change.  Respiratory: Negative.   Cardiovascular: Positive for leg swelling.  Gastrointestinal: Negative.   Musculoskeletal: Positive for gait problem.  Neurological: Negative.   Hematological: Negative.   All other systems reviewed and are negative.   BP 110/82 mmHg  Pulse 62  Ht '5\' 6"'$  (1.676 m)  Wt 203 lb 4 oz (92.194 kg)  BMI 32.82 kg/m2  Physical Exam  Constitutional: He is oriented to person, place, and time. He appears well-developed and well-nourished.  HENT:  Head: Normocephalic.  Nose: Nose normal.  Mouth/Throat: Oropharynx is clear and moist.  Eyes: Conjunctivae are normal. Pupils are equal, round, and reactive to light.  Neck: Normal range of motion. Neck supple. No JVD present.  Cardiovascular: Normal rate, regular rhythm, S1 normal, S2 normal, normal heart sounds and intact distal pulses.  Exam reveals no gallop and no friction rub.   No murmur heard. 2+ pitting edema to below the knees, trace pitting in the thighs, abdominal swelling  Pulmonary/Chest: Effort normal and breath sounds normal. No respiratory distress. He has no wheezes. He has no rales. He exhibits no tenderness.  Abdominal: Soft. Bowel sounds are normal. He exhibits no distension. There is no tenderness.  Musculoskeletal: Normal range of motion. He exhibits no edema or tenderness.  Lymphadenopathy:    He has no cervical adenopathy.  Neurological: He is alert and oriented to person, place, and time.  Coordination normal.  Skin: Skin is warm and dry. No rash noted. No erythema.  Psychiatric: He has a normal mood and affect. His behavior is normal. Judgment and thought content normal.      Assessment and Plan   Nursing note and vitals reviewed.

## 2014-12-18 NOTE — Assessment & Plan Note (Signed)
Blood pressure is well controlled on today's visit. No changes made to the medications. 

## 2014-12-18 NOTE — Assessment & Plan Note (Signed)
On anticoagulation, heart rate relatively well-controlled. Likely one of the causes of his diastolic CHF

## 2014-12-18 NOTE — Assessment & Plan Note (Signed)
Recommended that he continue his Lipitor. Goal LDL less than 70

## 2014-12-18 NOTE — Assessment & Plan Note (Signed)
We have encouraged continued exercise, careful diet management in an effort to lose weight. 

## 2014-12-18 NOTE — Assessment & Plan Note (Signed)
Tremendous weight gain over the past several months, roughly 20 pounds Significant pitting edema, abdominal bloating. Recommended he stop the Lasix, start torsemide 20 mg twice a day If he does not have them proven of his weight over the next week, recommended he increase torsemide up to 40 mg twice a day He will call with his weight next week Continue on current dose of potassium

## 2014-12-19 ENCOUNTER — Ambulatory Visit: Payer: Medicare Other | Admitting: Family

## 2014-12-23 ENCOUNTER — Telehealth: Payer: Self-pay

## 2014-12-23 ENCOUNTER — Other Ambulatory Visit: Payer: Self-pay | Admitting: Internal Medicine

## 2014-12-23 NOTE — Telephone Encounter (Signed)
Received alert report via fax from [facility] that pt has had wt loss of 4.4lbs in 1 day.   "Change in meds working well.  Pt denies any s/s of HF, denies any s/s of dehydration."

## 2014-12-24 ENCOUNTER — Telehealth: Payer: Self-pay | Admitting: *Deleted

## 2014-12-24 NOTE — Telephone Encounter (Signed)
Would consider trial of 2 up to 3 days of torsemide 40 mg twice a day Take 2 pills in the morning, 2 pills after lunch Then see how weight goes If there is a significant weight drop, less than 190, would come in for BMP at the end of the week to check potassium

## 2014-12-24 NOTE — Telephone Encounter (Signed)
Pt c/o swelling: STAT is pt has developed SOB within 24 hours  1. How long have you been experiencing swelling?   2. Where is the swelling located?   3.  Are you currently taking a "fluid pill"?   4.  Are you currently SOB?   5.  Have you traveled recently?  Wanted patient to call daily weight:  12/17/14 201.2 lbs, 12/18/14 200.4 lbs, 12/19/14 197.4 lbs, 12/20/14 200. Lbs, 12/21/14 192. Lbs, 12/22/14 192.6 lbs, 12/23/14 193.6 and 12/24/14 194.4  Taking new medication but not feeling well. Urination is slowing down.  Please call.

## 2014-12-24 NOTE — Telephone Encounter (Signed)
Spoke w/ pt's wife.  She reports that pt's wt was dropping, but has started to regain.  Pt has been eating a low sodium diet, but has been drinking more fluids, as his mouth has been dry. Advised pt to suck on ice chips to keep his mouth moist, but not add fluid. He verbalizes understanding and would like to try this for a few days. Asked him to call back if his swelling worsens or continues.

## 2014-12-24 NOTE — Telephone Encounter (Signed)
Pt request status of gabapentin refill; advised pt refilled electronically on 07/23/14. Pt voiced understanding.

## 2014-12-25 NOTE — Telephone Encounter (Signed)
Spoke w/ pt.  Advised him of Dr. Donivan Scull recommendation. Reports wt today of 196.4, which is up 2 lbs from yesterday.  He is agreeable to torsemide increase and will call if he wt drops so he can come in for BMET.

## 2014-12-27 ENCOUNTER — Other Ambulatory Visit: Payer: Self-pay | Admitting: Internal Medicine

## 2014-12-27 ENCOUNTER — Other Ambulatory Visit: Payer: Self-pay | Admitting: Physician Assistant

## 2014-12-30 ENCOUNTER — Other Ambulatory Visit: Payer: Self-pay | Admitting: Endocrinology

## 2014-12-30 ENCOUNTER — Other Ambulatory Visit: Payer: Self-pay | Admitting: Internal Medicine

## 2014-12-30 MED ORDER — INSULIN LISPRO 100 UNIT/ML (KWIKPEN): PEN_INJECTOR | SUBCUTANEOUS | Status: DC

## 2014-12-30 NOTE — Telephone Encounter (Signed)
[facility] taking 18 U at breakfast, 20 units at lunch and 14 units at supper. Webb Silversmith NP agreed to manage diabetes at 10/17/14 visit. Pt will cb and schedule f/u appt as advised at 10/17/14 appt.

## 2014-12-31 ENCOUNTER — Other Ambulatory Visit: Payer: Self-pay

## 2014-12-31 ENCOUNTER — Other Ambulatory Visit: Payer: Self-pay | Admitting: *Deleted

## 2014-12-31 MED ORDER — INSULIN LISPRO 100 UNIT/ML (KWIKPEN): PEN_INJECTOR | SUBCUTANEOUS | Status: DC

## 2014-12-31 MED ORDER — TORSEMIDE 20 MG PO TABS: 40.0000 mg | ORAL_TABLET | Freq: Two times a day (BID) | ORAL | Status: DC

## 2014-12-31 NOTE — Telephone Encounter (Signed)
Pt said mail order pharmacy advised pt to get 30 day supply of humalog since pt is out of med today. Pt request sent to [facility] [address]; advised pt done.

## 2015-01-02 ENCOUNTER — Other Ambulatory Visit: Payer: Self-pay

## 2015-01-02 NOTE — Telephone Encounter (Signed)
You have not filled metoprolol--- please advise

## 2015-01-03 MED ORDER — METOPROLOL SUCCINATE ER 50 MG PO TB24: 50.0000 mg | ORAL_TABLET | Freq: Two times a day (BID) | ORAL | Status: DC

## 2015-01-03 MED ORDER — INSULIN PEN NEEDLE 32G X 4 MM MISC: Status: DC

## 2015-01-10 ENCOUNTER — Other Ambulatory Visit: Payer: Self-pay

## 2015-01-10 MED ORDER — INSULIN PEN NEEDLE 32G X 4 MM MISC: Status: DC

## 2015-01-10 MED ORDER — METOPROLOL SUCCINATE ER 50 MG PO TB24: 50.0000 mg | ORAL_TABLET | Freq: Two times a day (BID) | ORAL | Status: DC

## 2015-01-10 NOTE — Telephone Encounter (Signed)
[facility] left v/m requesting refill insulin pen needles, metoprolol and gabapentin; advised pt gabapentin was [facility] 12/23/14 and pt will ck with [facility];  Refills for insulin pen needles and metoprolol done per request and protocol. Spoke with Apolonio Schneiders at [facility] cancelled remaining refills for metoprolol and pen needles.

## 2015-01-16 ENCOUNTER — Ambulatory Visit (INDEPENDENT_AMBULATORY_CARE_PROVIDER_SITE_OTHER): Payer: Medicare Other | Admitting: Physician Assistant

## 2015-01-16 ENCOUNTER — Encounter: Payer: Self-pay | Admitting: Physician Assistant

## 2015-01-16 VITALS — BP 110/76 | HR 88 | Ht 66.0 in | Wt 191.5 lb

## 2015-01-16 DIAGNOSIS — I482 Chronic atrial fibrillation, unspecified: Secondary | ICD-10-CM

## 2015-01-16 DIAGNOSIS — I5032 Chronic diastolic (congestive) heart failure: Secondary | ICD-10-CM

## 2015-01-16 DIAGNOSIS — E785 Hyperlipidemia, unspecified: Secondary | ICD-10-CM | POA: Diagnosis not present

## 2015-01-16 DIAGNOSIS — N182 Chronic kidney disease, stage 2 (mild): Secondary | ICD-10-CM

## 2015-01-16 DIAGNOSIS — I10 Essential (primary) hypertension: Secondary | ICD-10-CM | POA: Diagnosis not present

## 2015-01-16 DIAGNOSIS — I63419 Cerebral infarction due to embolism of unspecified middle cerebral artery: Secondary | ICD-10-CM

## 2015-01-16 DIAGNOSIS — I2510 Atherosclerotic heart disease of native coronary artery without angina pectoris: Secondary | ICD-10-CM

## 2015-01-16 DIAGNOSIS — R296 Repeated falls: Secondary | ICD-10-CM

## 2015-01-16 DIAGNOSIS — Z794 Long term (current) use of insulin: Secondary | ICD-10-CM

## 2015-01-16 DIAGNOSIS — E1165 Type 2 diabetes mellitus with hyperglycemia: Secondary | ICD-10-CM

## 2015-01-16 NOTE — Patient Instructions (Addendum)
Medication Instructions:  Please continue your current medications   Labwork: BMET  Testing/Procedures: None  Follow-Up: 1 month

## 2015-01-16 NOTE — Progress Notes (Signed)
Cardiology Office Note:  Date of Encounter: 01/17/2015  ID: Shawn Grace., DOB 07/31/1931, MRN [id number]  PCP:  Webb Silversmith, NP Primary Cardiologist:  Dr. Rockey Situ, MD  Chief Complaint  Patient presents with  . other    3 week follow up. Meds reviewed by the patient verbally. Pt. c/o LE edeam.     HPI:  81 y.o. male with history of CAD s/p MI in 1997 treated with TPA at [facility], chronic Afib since 2006 on Eliquis, chronic diastolic CHF, previous stroke in 1999, HTN, HLD, multiple kidney stones s/p previous ureteral stenting 04/2014, and DM2 with diabetic neuropathy who presents to clinic today for routine follow up of his chronic diastolic CHF.   He has known CAD as above. He last underwent stress testing in March 2007 by [facility] which showed an EF of 50% with a scar at the apex and mild peri-infarct ischemia. He had a hospital admission in October 2015 for sepsis 2/2 UTI and bladder outflow obstruction. Echo showed normal LV EF and moderate pulmonary HTN. He diuresed well with SCr running in the 1.2 to 1.5 range. In January he suffered a fall after taking a nap suffering significant trauma to his left arm. Echo in January 2015 showed EF 60%, moderately dilated LA, mild MR, and TR, PASP 53. There were concerns for other falls as well as he as subsequently fallen in his bathroom. Hospitalized 04/2014 for acute stroke. He was off his warfarin for circumcision with a Lovenox bridge. MRI showed multiple embolic strokes in the left parietal and occipital areas. Carotid ultrasound showed mild bilateral plaque. Echo on 05/03/2014 showed normal LVEF, normal PASP. He was hospitalized again in February 2016 for recurrent UTI, treated with antibiotics.    At his follow up with Dr. Rockey Situ on 9/7 he reported taking his Lasix 40 mg daily, though his weight was up 20 pounds from his prior office visit in March 2016. He continued to drink significant fluids 2/2 xerostomia. His legs were tight and he noted  some SOB with exertion. His Lasix was changed to torsemide 20 mg bid. In telephone follow up this was subsequently increased to 40 mg bid given weight increase.   Today, he reports feeling better. He is still taking torsemide 40 mg bid, and has been on this dose now since 9/13. His weight hovers around 191 to 193. He continues to drink copious amounts of water along with 3 cans of diet soda every day. He denies any chest pain, SOB, orthopnea, PND, or early satiety. He reports the LE edema is actually improved from previous.         Past Medical History  Diagnosis Date  . HYPERTENSION   . HYPERLIPIDEMIA   . CAD     a. MI 01/29/1996 tx'd w/ TPA @ [facility]; b. Myoview 06/2005: EF 50%, scar @ apex, mild peri-infarct ischemia  . Chronic atrial fibrillation ([place])     a. since 2006; b. on warfarin  . DM   . History of kidney stones   . Neuropathy of both feet ([place])   . Chronic diastolic CHF (congestive heart failure) ([place])     a. echo 04/2006: EF lower limits of nl, mod LVH, mild aortic root dilatation, & mild MR, biatrial enlargement; b. echo 04/2013: EF 60%, mod dilated LA, mild MR & TR, mod pulm HTN w/ RV systolic pressure 53, c. echo 04/21/14: EF 55-60%, unable to exclude WMA, severely dilated LA 6.6 cm, nl RVSP, mildly dilated aortic root  .  Kidney stone     a. s/p left ureteral stenting 04/24/14  . COPD (chronic obstructive pulmonary disease) ([place])   . GERD (gastroesophageal reflux disease)   . Cancer ([place])     skin  . Arthritis   . CVA 5277,8242    x2  . Falls   . Poor balance   :  Past Surgical History  Procedure Laterality Date  . Tonsillectomy and adenoidectomy  1959  . Carotid stent insertion  1997  . Cardiac catheterization  1997    [facility]  . Coronary angioplasty  1997    s/p stent placement x 2   . Kidney surgery  05/2013    s/p stent placement   . Bladder surgery      stent placement   . Circumcision  2016  . Stents ureters Bilateral   . Ureteroscopy with holmium laser  lithotripsy Left 10/09/2014    Procedure: URETEROSCOPY WITH HOLMIUM LASER LITHOTRIPSY;  Surgeon: Hollice Espy, MD;  Location: [facility] ORS;  Service: Urology;  Laterality: Left;  . Cystoscopy w/ ureteral stent removal Left 10/09/2014    Procedure: CYSTOSCOPY WITH STENT REMOVAL;  Surgeon: Hollice Espy, MD;  Location: [facility] ORS;  Service: Urology;  Laterality: Left;  . Cystoscopy with stent placement Left 10/09/2014    Procedure: CYSTOSCOPY WITH STENT PLACEMENT;  Surgeon: Hollice Espy, MD;  Location: [facility] ORS;  Service: Urology;  Laterality: Left;  :  Social History:  The patient  reports that he quit smoking about 24 years ago. His smoking use included Cigarettes. He has a 80 pack-year smoking history. He quit smokeless tobacco use about 24 years ago. He reports that he does not drink alcohol or use illicit drugs.   Family History  Problem Relation Age of Onset  . Heart disease Maternal Grandmother   . Diabetes Maternal Grandmother   . Cancer Neg Hx   . Stroke Neg Hx   . Heart disease Mother      Allergies:  Allergies  Allergen Reactions  . Contrast Media [Iodinated Diagnostic Agents] Shortness Of Breath  . Morphine And Related Other (See Comments)    Hallucinations   . Niacin And Related Dermatitis     Home Medications:  Current Outpatient Prescriptions  Medication Sig Dispense Refill  . acetaminophen (TYLENOL) 650 MG CR tablet Take 650 mg by mouth every 8 (eight) hours as needed.      Marland Kitchen albuterol (2.5 MG/3ML) 0.083% NEBU 3 mL, albuterol (5 MG/ML) 0.5% NEBU 0.5 mL Inhale 1 mg into the lungs.    . ALPRAZolam (XANAX) 0.25 MG tablet Take 1 tablet (0.25 mg total) by mouth at bedtime as needed for anxiety. 90 tablet 0  . apixaban (ELIQUIS) 5 MG TABS tablet Take 5 mg by mouth 2 (two) times daily.    Marland Kitchen atorvastatin (LIPITOR) 40 MG tablet TAKE 1 TABLET DAILY 90 tablet 3  . bimatoprost (LUMIGAN) 0.03 % ophthalmic solution Place 1 drop into both eyes at bedtime.     .  budesonide-formoterol (SYMBICORT) 160-4.5 MCG/ACT inhaler Inhale 2 puffs into the lungs 2 (two) times daily.    . citalopram (CELEXA) 10 MG tablet TAKE 1 TABLET DAILY 90 tablet 1  . finasteride (PROSCAR) 5 MG tablet Take 5 mg by mouth daily.    Marland Kitchen gabapentin (NEURONTIN) 100 MG capsule TAKE 1 CAPSULE THREE TIMES A DAY 270 capsule 0  . insulin lispro (HUMALOG KWIKPEN) 100 UNIT/ML KiwkPen 18 units with breakfast, 20 units with with lunch and 14 units with supper 5 pen  0  . Insulin Pen Needle 32G X 4 MM MISC Use three times daily for insulin admin Dx E11.65 270 each 1  . Insulin Syringe-Needle U-100 30G X 1/2" 1 ML MISC 1 each by Does not apply route 3 (three) times daily. 200 each 5  . KLOR-CON M10 10 MEQ tablet TAKE 3 TABLETS DAILY 270 tablet 3  . LEVEMIR 100 UNIT/ML injection INJECT 28 UNITS TOTAL UNDER THE SKIN AT BEDTIME. ADD 2 UNITS PER DAY UNTIL MORNING SUGAR IS LESS THAN 140 3 vial 5  . metoprolol succinate (TOPROL-XL) 50 MG 24 hr tablet Take 1 tablet (50 mg total) by mouth 2 (two) times daily. 180 tablet 1  . nitroGLYCERIN (NITROSTAT) 0.4 MG SL tablet Place 0.4 mg under the tongue every 5 (five) minutes as needed.      . nystatin-triamcinolone (MYCOLOG II) cream Apply 1 application topically 2 (two) times daily.     . Omega-3 Fatty Acids (FISH OIL) 1000 MG CAPS Take 1 capsule by mouth 3 (three) times daily.     . predniSONE (DELTASONE) 10 MG tablet '10mg'$  a day for 1 week, then back down to '5mg'$  a day. (Patient taking differently: 5 mg. 5 mg alt with 10 mg daily)    . PROAIR HFA 108 (90 BASE) MCG/ACT inhaler INHALE 1 TO 2 PUFFS EVERY 4 HOURS AS NEEDED 25.5 g 4  . sitaGLIPtin (JANUVIA) 100 MG tablet Take 1 tablet (100 mg total) by mouth daily. 14 tablet 0  . tamsulosin (FLOMAX) 0.4 MG CAPS capsule TAKE 1 CAPSULE DAILY 90 capsule 0  . tiotropium (SPIRIVA) 18 MCG inhalation capsule Place 18 mcg into inhaler and inhale daily.      Marland Kitchen torsemide (DEMADEX) 20 MG tablet Take 2 tablets (40 mg total) by  mouth 2 (two) times daily. 360 tablet 3   No current facility-administered medications for this visit.     Review of Systems:  Review of Systems  Constitutional: Positive for weight loss. Negative for fever, chills, malaise/fatigue and diaphoresis.  HENT: Negative for congestion.   Eyes: Negative for discharge and redness.  Respiratory: Negative for cough, hemoptysis, sputum production, shortness of breath and wheezing.   Cardiovascular: Positive for leg swelling. Negative for chest pain, palpitations, orthopnea, claudication and PND.  Gastrointestinal: Negative for nausea and vomiting.  Musculoskeletal: Negative for falls.  Skin: Negative for rash.  Neurological: Negative for dizziness, tingling, tremors, sensory change, speech change, focal weakness, seizures, loss of consciousness and weakness.  Endo/Heme/Allergies: Does not bruise/bleed easily.  Psychiatric/Behavioral: The patient is not nervous/anxious.      Physical Exam:  Blood pressure 110/76, pulse 88, height '5\' 6"'$  (1.676 m), weight 191 lb 8 oz (86.864 kg). BMI: Body mass index is 30.92 kg/(m^2). General: Pleasant, NAD. Psych: Normal affect. Responds to questions with normal affect.  Neuro: Alert and oriented X 3. Moves all extremities spontaneously. HEENT: Normocephalic, atraumatic. EOM intact. Sclera anicteric.  Neck: Trachea midline. Supple without bruits or JVD. Lungs:  Respirations regular and unlabored. CTA bilaterally without wheezing, crackles, or rhonchi.  Heart: RRR, normal s3, s4. No murmurs, rubs, or gallops.  Abdomen: Obese, soft, non-tender, non-distended, BS + x 4.  Extremities: No clubbing or cyanosis. 1+ pitting edema bilaterally to the knees.    Accessory Clinical Findings:  EKG: not performed  Recent Labs: 10/09/2014: ALT 13*; B Natriuretic Peptide 586.0*; Hemoglobin 14.1; Platelets 244; TSH 6.010* 01/16/2015: BUN 35*; Creatinine, Ser 1.59*; Potassium 3.6; Sodium 141  10/04/2014: Cholesterol 107;  HDL 43.10; LDL Cholesterol  47; Total CHOL/HDL Ratio 2; Triglycerides 83.0; VLDL 16.6  Estimated Creatinine Clearance: 37.6 mL/min (by C-G formula based on Cr of 1.59).  Weights: Wt Readings from Last 3 Encounters:  01/16/15 191 lb 8 oz (86.864 kg)  12/18/14 203 lb 4 oz (92.194 kg)  11/08/14 197 lb 12.8 oz (89.721 kg)    Other studies Reviewed: Additional studies/ records that were reviewed today include: prior notes.  Assessment & Plan:  1. Chronic diastolic CHF: -Lower extremity edema continues to improve -He continues to drink copious amounts of water along with 3 cans of diet soda daily. He does agree today to cut his soda intake back to 1 can daily. He also agrees to cut back his water intake and chew gum and ice chips -Continue torsemide 40 mg bid until bmet is back, (further direction at that time once renal function is known) -He must make lifestyle adjustments  -Continue Toprol XL 50 mg bid -Continue KCl current dose as his torsemide dosing will be decreasing (see lab note)  2. Chronic Afib: -Rate controlled -Toprol XL 50 mg bid -On Eliquis 5 mg bid -CHADSVASc at least 8 (CHF, HTN, age x 2, DM, stroke, vascular disease) giving him an estimated annual stroke risk of 6.7%  3. CAD: -No symptoms of angina -On warfarin in place of aspirin  -No ischemic evaluation planned at this time  3. HTN: -Controlled -Continue Toprol and torsemide as above  4. HLD: -Lipitor 40 mg daily   5. Previous stroke: -In the setting of holding warfarin for circumcision -Now on Eliquis for Afib   6. IDDM: -Per PCP    Dispo: -Follow up 1 month  Current medicines are reviewed at length with the patient today.  The patient did not have any concerns regarding medicines.   Christell Faith, PA-C [facility] [place] [place], [place] [id number] [phone] [facility] 01/17/2015, 12:53 PM

## 2015-01-17 ENCOUNTER — Encounter: Payer: Self-pay | Admitting: Physician Assistant

## 2015-01-17 LAB — BASIC METABOLIC PANEL
BUN/Creatinine Ratio: 22 (ref 10–22)
BUN: 35 mg/dL — ABNORMAL HIGH (ref 8–27)
CO2: 24 mmol/L (ref 18–29)
CREATININE: 1.59 mg/dL — AB (ref 0.76–1.27)
Calcium: 9.2 mg/dL (ref 8.6–10.2)
Chloride: 95 mmol/L — ABNORMAL LOW (ref 97–108)
GFR calc Af Amer: 46 mL/min/{1.73_m2} — ABNORMAL LOW (ref >59)
GFR, EST NON AFRICAN AMERICAN: 40 mL/min/{1.73_m2} — AB (ref >59)
Glucose: 389 mg/dL — ABNORMAL HIGH (ref 65–99)
Potassium: 3.6 mmol/L (ref 3.5–5.2)
SODIUM: 141 mmol/L (ref 134–144)

## 2015-01-20 ENCOUNTER — Encounter: Payer: Self-pay | Admitting: Internal Medicine

## 2015-01-20 ENCOUNTER — Ambulatory Visit (INDEPENDENT_AMBULATORY_CARE_PROVIDER_SITE_OTHER): Payer: Medicare Other | Admitting: Internal Medicine

## 2015-01-20 VITALS — BP 124/76 | HR 54 | Temp 98.2°F | Wt 194.0 lb

## 2015-01-20 DIAGNOSIS — I10 Essential (primary) hypertension: Secondary | ICD-10-CM

## 2015-01-20 DIAGNOSIS — N400 Enlarged prostate without lower urinary tract symptoms: Secondary | ICD-10-CM

## 2015-01-20 DIAGNOSIS — F419 Anxiety disorder, unspecified: Secondary | ICD-10-CM

## 2015-01-20 DIAGNOSIS — E785 Hyperlipidemia, unspecified: Secondary | ICD-10-CM

## 2015-01-20 DIAGNOSIS — E1165 Type 2 diabetes mellitus with hyperglycemia: Secondary | ICD-10-CM

## 2015-01-20 DIAGNOSIS — G4700 Insomnia, unspecified: Secondary | ICD-10-CM

## 2015-01-20 DIAGNOSIS — I482 Chronic atrial fibrillation, unspecified: Secondary | ICD-10-CM

## 2015-01-20 DIAGNOSIS — Z794 Long term (current) use of insulin: Secondary | ICD-10-CM | POA: Diagnosis not present

## 2015-01-20 DIAGNOSIS — Z23 Encounter for immunization: Secondary | ICD-10-CM | POA: Diagnosis not present

## 2015-01-20 DIAGNOSIS — I2510 Atherosclerotic heart disease of native coronary artery without angina pectoris: Secondary | ICD-10-CM

## 2015-01-20 DIAGNOSIS — I5032 Chronic diastolic (congestive) heart failure: Secondary | ICD-10-CM

## 2015-01-20 DIAGNOSIS — J449 Chronic obstructive pulmonary disease, unspecified: Secondary | ICD-10-CM

## 2015-01-20 LAB — HEMOGLOBIN A1C: Hgb A1c MFr Bld: 9.6 % — ABNORMAL HIGH (ref 4.6–6.5)

## 2015-01-20 NOTE — Assessment & Plan Note (Signed)
LDL at goal on Lipitor No angina Continue Toprol

## 2015-01-20 NOTE — Assessment & Plan Note (Signed)
Regular today Continue Toprol and Eliquis

## 2015-01-20 NOTE — Assessment & Plan Note (Signed)
Controlled on Celexa and Xanax

## 2015-01-20 NOTE — Assessment & Plan Note (Signed)
Continue inhalers Will discuss with pt about stopping Prednisone

## 2015-01-20 NOTE — Assessment & Plan Note (Signed)
Well controlled off meds Will continue to monitor

## 2015-01-20 NOTE — Assessment & Plan Note (Signed)
Will repeat A1C today Microalbumin not done today Encouraged him to consume a low carb diet Foot exam today Encouraged him to see an eye doctor annually Continue current medications unless directed otherwise.

## 2015-01-20 NOTE — Assessment & Plan Note (Signed)
LDL at goal on Lipitor Encouraged him to consume a low fat diet 

## 2015-01-20 NOTE — Progress Notes (Signed)
Pre visit review using our clinic review tool, if applicable. No additional management support is needed unless otherwise documented below in the visit note. 

## 2015-01-20 NOTE — Progress Notes (Signed)
Subjective:    Patient ID: Shawn Grace., male    DOB: [birth year], 81 y.o.   MRN: [id number]  HPI  Pt presents to the clinic today to follow up chronic conditions.  CHF: He has been breathing okay. He did see cardiology last week- note reviewed. He is currently taking Torsemide, Potassium, and Toprol. His kidney function has gotten slightly worse so they are not able to go up on his Torsemide at this time. He has noted improvement in his lower extremity edema. Echo from 04/2014 reviewed, EF 54%. He follows with Dr. Rockey Situ.  Anxiety: Well controlled on Celexa. He takes Xanax only when needed.  Afib: He is not symptomatic. He is rate controlled on Toprol. Eliquis for anticoagulation. ECG from 12/2014. He follows with Dr. Rockey Situ.  BPH: Voiding fine on Flomax and Proscar. No recent UTI's. He does follow with Dr. Erlene Quan.  CAD s/p MI: He denies angina. LDL 47 on Lipitor and Fish Oil. He has Nitorstat to take if needed but reports he has not used it. He does follow with Dr. Rockey Situ.  COPD: He denies shortness of breath. He is on Prednisone daily. He takes Symbicort and Spiriva daily. He does have an Albuterol inhaler if he needs it. He does not see a pulmonologist.  HTN: BP well controlled. He in not on any specific medications for this.  HLD: LDL 47 on Lipitor and Fish Oil. He tries to consume a low fat diet.  Insomnia: He reports he has been sleeping well lately.  DM 2: His last A1C was 8.3%. His sugars range 140-294. He takes Humalog before meals. He takes Levemir at nights. He also takes Neurontin and Januvia as prescribed. His last eye exam was 06/2014. Flu shot he will get today. Pneumovax 2010. Prevnar 2015.  Review of Systems      Past Medical History  Diagnosis Date  . HYPERTENSION   . HYPERLIPIDEMIA   . CAD     a. MI 01/29/1996 tx'd w/ TPA @ [place]; b. Myoview 06/2005: EF 50%, scar @ apex, mild peri-infarct ischemia  . Chronic atrial fibrillation ([place])     a. since 2006;  b. on warfarin  . DM   . History of kidney stones   . Neuropathy of both feet ([place])   . Chronic diastolic CHF (congestive heart failure) ([place])     a. echo 04/2006: EF lower limits of nl, mod LVH, mild aortic root dilatation, & mild MR, biatrial enlargement; b. echo 04/2013: EF 60%, mod dilated LA, mild MR & TR, mod pulm HTN w/ RV systolic pressure 53, c. echo 04/21/14: EF 55-60%, unable to exclude WMA, severely dilated LA 6.6 cm, nl RVSP, mildly dilated aortic root  . Kidney stone     a. s/p left ureteral stenting 04/24/14  . COPD (chronic obstructive pulmonary disease) ([place])   . GERD (gastroesophageal reflux disease)   . Cancer ([place])     skin  . Arthritis   . CVA 3149,7026    x2  . Falls   . Poor balance     Current Outpatient Prescriptions  Medication Sig Dispense Refill  . acetaminophen (TYLENOL) 650 MG CR tablet Take 650 mg by mouth every 8 (eight) hours as needed.      Marland Kitchen albuterol (2.5 MG/3ML) 0.083% NEBU 3 mL, albuterol (5 MG/ML) 0.5% NEBU 0.5 mL Inhale 1 mg into the lungs.    . ALPRAZolam (XANAX) 0.25 MG tablet Take 1 tablet (0.25 mg total) by mouth at bedtime  as needed for anxiety. 90 tablet 0  . apixaban (ELIQUIS) 5 MG TABS tablet Take 5 mg by mouth 2 (two) times daily.    Marland Kitchen atorvastatin (LIPITOR) 40 MG tablet TAKE 1 TABLET DAILY 90 tablet 3  . bimatoprost (LUMIGAN) 0.03 % ophthalmic solution Place 1 drop into both eyes at bedtime.     . budesonide-formoterol (SYMBICORT) 160-4.5 MCG/ACT inhaler Inhale 2 puffs into the lungs 2 (two) times daily.    . citalopram (CELEXA) 10 MG tablet TAKE 1 TABLET DAILY 90 tablet 1  . finasteride (PROSCAR) 5 MG tablet Take 5 mg by mouth daily.    Marland Kitchen gabapentin (NEURONTIN) 100 MG capsule TAKE 1 CAPSULE THREE TIMES A DAY 270 capsule 0  . insulin lispro (HUMALOG KWIKPEN) 100 UNIT/ML KiwkPen 18 units with breakfast, 20 units with with lunch and 14 units with supper 5 pen 0  . Insulin Pen Needle 32G X 4 MM MISC Use three times daily for insulin admin Dx  E11.65 270 each 1  . Insulin Syringe-Needle U-100 30G X 1/2" 1 ML MISC 1 each by Does not apply route 3 (three) times daily. 200 each 5  . KLOR-CON M10 10 MEQ tablet TAKE 3 TABLETS DAILY 270 tablet 3  . LEVEMIR 100 UNIT/ML injection INJECT 28 UNITS TOTAL UNDER THE SKIN AT BEDTIME. ADD 2 UNITS PER DAY UNTIL MORNING SUGAR IS LESS THAN 140 3 vial 5  . metoprolol succinate (TOPROL-XL) 50 MG 24 hr tablet Take 1 tablet (50 mg total) by mouth 2 (two) times daily. 180 tablet 1  . nitroGLYCERIN (NITROSTAT) 0.4 MG SL tablet Place 0.4 mg under the tongue every 5 (five) minutes as needed.      . nystatin-triamcinolone (MYCOLOG II) cream Apply 1 application topically 2 (two) times daily.     . Omega-3 Fatty Acids (FISH OIL) 1000 MG CAPS Take 1 capsule by mouth 3 (three) times daily.     . predniSONE (DELTASONE) 10 MG tablet '10mg'$  a day for 1 week, then back down to '5mg'$  a day. (Patient taking differently: 5 mg. 5 mg alt with 10 mg daily)    . PROAIR HFA 108 (90 BASE) MCG/ACT inhaler INHALE 1 TO 2 PUFFS EVERY 4 HOURS AS NEEDED 25.5 g 4  . sitaGLIPtin (JANUVIA) 100 MG tablet Take 1 tablet (100 mg total) by mouth daily. 14 tablet 0  . tamsulosin (FLOMAX) 0.4 MG CAPS capsule TAKE 1 CAPSULE DAILY 90 capsule 0  . tiotropium (SPIRIVA) 18 MCG inhalation capsule Place 18 mcg into inhaler and inhale daily.      Marland Kitchen torsemide (DEMADEX) 20 MG tablet Take 2 tablets (40 mg total) by mouth 2 (two) times daily. 360 tablet 3   No current facility-administered medications for this visit.    Allergies  Allergen Reactions  . Contrast Media [Iodinated Diagnostic Agents] Shortness Of Breath  . Morphine And Related Other (See Comments)    Hallucinations   . Niacin And Related Dermatitis    Family History  Problem Relation Age of Onset  . Heart disease Maternal Grandmother   . Diabetes Maternal Grandmother   . Cancer Neg Hx   . Stroke Neg Hx   . Heart disease Mother     Social History   Social History  . Marital  Status: Married    Spouse Name: N/A  . Number of Children: N/A  . Years of Education: N/A   Occupational History  . Not on file.   Social History Main Topics  . Smoking  status: Former Smoker -- 2.00 packs/day for 40 years    Types: Cigarettes    Quit date: 05/24/1990  . Smokeless tobacco: Former Systems developer    Quit date: 05/24/1990  . Alcohol Use: No  . Drug Use: No  . Sexual Activity: Not Currently   Other Topics Concern  . Not on file   Social History Narrative     Constitutional: Denies fever, malaise, fatigue, headache or abrupt weight changes.  Respiratory: Denies difficulty breathing, shortness of breath, cough or sputum production.   Cardiovascular: Pt reports swelling in his legs. Denies chest pain, chest tightness, palpitations or swelling in the hands Gastrointestinal: Denies abdominal pain, bloating, constipation, diarrhea or blood in the stool.  GU: Denies urgency, frequency, pain with urination, burning sensation, blood in urine, odor or discharge. Skin: Denies redness, rashes, lesions or ulcercations.  Neurological: Denies dizziness, difficulty with memory, difficulty with speech or problems with balance and coordination.  Psych: Denies anxiety, depression, SI/HI.  No other specific complaints in a complete review of systems (except as listed in HPI above).  Objective:   Physical Exam   BP 124/76 mmHg  Pulse 54  Temp(Src) 98.2 F (36.8 C) (Oral)  Wt 194 lb (87.998 kg)  SpO2 98% Wt Readings from Last 3 Encounters:  01/20/15 194 lb (87.998 kg)  01/16/15 191 lb 8 oz (86.864 kg)  12/18/14 203 lb 4 oz (92.194 kg)    General: Appears his stated age, obese, chronically ill appearing, in NAD. Cardiovascular: Normal rate and rhythm. S1,S2 noted.  No murmur, rubs or gallops noted. 1+ pitting BLE edema.  Pulmonary/Chest: Normal effort and positive vesicular breath sounds. No respiratory distress. No wheezes, rales or ronchi noted.  Abdomen: Distended but soft and  nontender. Normal bowel sounds. No distention or masses noted. Liver, spleen and kidneys non palpable. Neurological: Alert and oriented.  Psychiatric: Mood and affect normal. Behavior is normal. Judgment and thought content normal.    BMET    Component Value Date/Time   NA 141 01/16/2015 1406   NA 144 10/09/2014 2249   NA 137 08/05/2014 1839   K 3.6 01/16/2015 1406   K 3.6 08/05/2014 1839   CL 95* 01/16/2015 1406   CL 106 08/05/2014 1839   CO2 24 01/16/2015 1406   CO2 23 08/05/2014 1839   GLUCOSE 389* 01/16/2015 1406   GLUCOSE 122* 10/09/2014 2249   GLUCOSE 235* 08/05/2014 1839   BUN 35* 01/16/2015 1406   BUN 22* 10/09/2014 2249   BUN 19 08/05/2014 1839   CREATININE 1.59* 01/16/2015 1406   CREATININE 1.20 08/05/2014 1839   CALCIUM 9.2 01/16/2015 1406   CALCIUM 8.6* 08/05/2014 1839   GFRNONAA 40* 01/16/2015 1406   GFRNONAA 57* 08/05/2014 1839   GFRNONAA >60 05/06/2014 0406   GFRAA 46* 01/16/2015 1406   GFRAA >60 08/05/2014 1839   GFRAA >60 05/06/2014 0406    Lipid Panel     Component Value Date/Time   CHOL 107 10/04/2014 0940   TRIG 83.0 10/04/2014 0940   HDL 43.10 10/04/2014 0940   CHOLHDL 2 10/04/2014 0940   VLDL 16.6 10/04/2014 0940   LDLCALC 47 10/04/2014 0940    CBC    Component Value Date/Time   WBC 21.0* 10/09/2014 2249   WBC 17.2* 08/05/2014 1839   RBC 5.41 10/09/2014 2249   RBC 4.86 08/05/2014 1839   HGB 14.1 10/09/2014 2249   HGB 13.3 08/05/2014 1839   HCT 45.2 10/09/2014 2249   HCT 40.1 08/05/2014 1839   PLT  244 10/09/2014 2249   PLT 246 08/05/2014 1839   MCV 83.5 10/09/2014 2249   MCV 83 08/05/2014 1839   MCH 26.1 10/09/2014 2249   MCH 27.3 08/05/2014 1839   MCHC 31.2* 10/09/2014 2249   MCHC 33.1 08/05/2014 1839   RDW 16.4* 10/09/2014 2249   RDW 16.3* 08/05/2014 1839   LYMPHSABS 1.7 10/09/2014 2249   LYMPHSABS 2.3 08/05/2014 1839   MONOABS 2.0* 10/09/2014 2249   MONOABS 2.0* 08/05/2014 1839   EOSABS 0.0 10/09/2014 2249   EOSABS 0.1  08/05/2014 1839   BASOSABS 0.1 10/09/2014 2249   BASOSABS 0.1 08/05/2014 1839    Hgb A1C Lab Results  Component Value Date   HGBA1C 8.3* 10/09/2014        Assessment & Plan:

## 2015-01-20 NOTE — Assessment & Plan Note (Signed)
Doing well on dual therapy Continue Flomax and Proscar

## 2015-01-20 NOTE — Assessment & Plan Note (Signed)
Mild lower extremity edema but no SOB Continue Torsemide, Toprol

## 2015-01-20 NOTE — Assessment & Plan Note (Signed)
Currently not an issue Will continue to monitor

## 2015-01-20 NOTE — Patient Instructions (Signed)
Diabetes and Exercise Exercising regularly is important. It is not just about losing weight. It has many health benefits, such as:  Improving your overall fitness, flexibility, and endurance.  Increasing your bone density.  Helping with weight control.  Decreasing your body fat.  Increasing your muscle strength.  Reducing stress and tension.  Improving your overall health. People with diabetes who exercise gain additional benefits because exercise:  Reduces appetite.  Improves the body's use of blood sugar (glucose).  Helps lower or control blood glucose.  Decreases blood pressure.  Helps control blood lipids (such as cholesterol and triglycerides).  Improves the body's use of the hormone insulin by:  Increasing the body's insulin sensitivity.  Reducing the body's insulin needs.  Decreases the risk for heart disease because exercising:  Lowers cholesterol and triglycerides levels.  Increases the levels of good cholesterol (such as high-density lipoproteins [HDL]) in the body.  Lowers blood glucose levels. YOUR ACTIVITY PLAN  Choose an activity that you enjoy, and set realistic goals. To exercise safely, you should begin practicing any new physical activity slowly, and gradually increase the intensity of the exercise over time. Your health care provider or diabetes educator can help create an activity plan that works for you. General recommendations include:  Encouraging children to engage in at least 60 minutes of physical activity each day.  Stretching and performing strength training exercises, such as yoga or weight lifting, at least 2 times per week.  Performing a total of at least 150 minutes of moderate-intensity exercise each week, such as brisk walking or water aerobics.  Exercising at least 3 days per week, making sure you allow no more than 2 consecutive days to pass without exercising.  Avoiding long periods of inactivity (90 minutes or more). When you  have to spend an extended period of time sitting down, take frequent breaks to walk or stretch. RECOMMENDATIONS FOR EXERCISING WITH TYPE 1 OR TYPE 2 DIABETES   Check your blood glucose before exercising. If blood glucose levels are greater than 240 mg/dL, check for urine ketones. Do not exercise if ketones are present.  Avoid injecting insulin into areas of the body that are going to be exercised. For example, avoid injecting insulin into:  The arms when playing tennis.  The legs when jogging.  Keep a record of:  Food intake before and after you exercise.  Expected peak times of insulin action.  Blood glucose levels before and after you exercise.  The type and amount of exercise you have done.  Review your records with your health care provider. Your health care provider will help you to develop guidelines for adjusting food intake and insulin amounts before and after exercising.  If you take insulin or oral hypoglycemic agents, watch for signs and symptoms of hypoglycemia. They include:  Dizziness.  Shaking.  Sweating.  Chills.  Confusion.  Drink plenty of water while you exercise to prevent dehydration or heat stroke. Body water is lost during exercise and must be replaced.  Talk to your health care provider before starting an exercise program to make sure it is safe for you. Remember, almost any type of activity is better than none.   This information is not intended to replace advice given to you by your health care provider. Make sure you discuss any questions you have with your health care provider.   Document Released: 06/19/2003 Document Revised: 08/13/2014 Document Reviewed: 09/05/2012 Elsevier Interactive Patient Education 2016 Elsevier Inc.  

## 2015-01-21 NOTE — Addendum Note (Signed)
Addended by: Lindalou Hose Y on: 01/21/2015 02:00 PM   Modules accepted: Medications

## 2015-01-22 NOTE — Addendum Note (Signed)
Addended by: Lurlean Nanny on: 01/22/2015 08:42 AM   Modules accepted: Orders

## 2015-01-30 ENCOUNTER — Telehealth: Payer: Self-pay | Admitting: *Deleted

## 2015-01-30 NOTE — Telephone Encounter (Signed)
Pt wife calling stating we just changed pt fluid pills on 01/16/15  And now pt is on 40 mg of fluid pills Pt weight has gone from from 01/16/15 192 to today 197  His weight keeps going up, she noticed it when we changed it from 80 mg to 40 mg Please advise, they would like to know what to do

## 2015-01-30 NOTE — Telephone Encounter (Signed)
S/w pt wife who reports the following weights: 10/17  194 10/18   193 10/19   196 10/20    197 Wife realizes these are not huge weight increases but is concerned because they are trending up.  Started torsemide 9/7, lasix discontinued. States he has been taking torsemide '20mg'$  as directed.  Denies dietary changes. Restricting fluids. Denies SOB. Reports increase swelling bilateral legs and feet. Wife asks if pt can increase torsemide for a few days Reviewed low sodium foods, elevate feet Forward to [employer]     Notes Recorded by Rise Mu, PA-C on 01/17/2015 at 7:27 AM Please call the patient. Renal function is trending up, slightly. Would continue the torsemide 40 mg bid over the weekend, then go back to usual dose of 20 mg bid Work on fluid restriction like we talked about and chewing gum

## 2015-01-31 ENCOUNTER — Other Ambulatory Visit: Payer: Self-pay

## 2015-01-31 DIAGNOSIS — I509 Heart failure, unspecified: Secondary | ICD-10-CM

## 2015-01-31 NOTE — Telephone Encounter (Signed)
S/w pt wife of recommendations who repeated instructions back to me. Wife is agreeable w/plan.  Lab orders placed and scheduled. Wife states he did not gain or lose any weight since yesterday.

## 2015-01-31 NOTE — Telephone Encounter (Signed)
Please have patient increase torsemide to 40 mg bid for the next 5 days. Recheck bmet mid next week. He ultimately may need 40 mg bid intermittently.

## 2015-02-07 ENCOUNTER — Other Ambulatory Visit (INDEPENDENT_AMBULATORY_CARE_PROVIDER_SITE_OTHER): Payer: Medicare Other | Admitting: *Deleted

## 2015-02-07 DIAGNOSIS — I509 Heart failure, unspecified: Secondary | ICD-10-CM | POA: Diagnosis not present

## 2015-02-08 LAB — BASIC METABOLIC PANEL
BUN / CREAT RATIO: 18 (ref 10–22)
BUN: 25 mg/dL (ref 8–27)
CO2: 25 mmol/L (ref 18–29)
CREATININE: 1.39 mg/dL — AB (ref 0.76–1.27)
Calcium: 8.7 mg/dL (ref 8.6–10.2)
Chloride: 98 mmol/L (ref 97–106)
GFR calc Af Amer: 55 mL/min/{1.73_m2} — ABNORMAL LOW (ref >59)
GFR, EST NON AFRICAN AMERICAN: 47 mL/min/{1.73_m2} — AB (ref >59)
Glucose: 187 mg/dL — ABNORMAL HIGH (ref 65–99)
Potassium: 3.6 mmol/L (ref 3.5–5.2)
SODIUM: 141 mmol/L (ref 136–144)

## 2015-02-10 ENCOUNTER — Other Ambulatory Visit: Payer: Self-pay

## 2015-02-10 ENCOUNTER — Other Ambulatory Visit: Payer: Self-pay | Admitting: Family

## 2015-02-10 DIAGNOSIS — I4891 Unspecified atrial fibrillation: Secondary | ICD-10-CM

## 2015-02-13 ENCOUNTER — Ambulatory Visit: Payer: Medicare Other | Admitting: Family

## 2015-02-14 ENCOUNTER — Other Ambulatory Visit: Payer: Medicare Other

## 2015-02-20 LAB — HM DIABETES EYE EXAM

## 2015-02-21 ENCOUNTER — Other Ambulatory Visit (INDEPENDENT_AMBULATORY_CARE_PROVIDER_SITE_OTHER): Payer: Medicare Other

## 2015-02-21 DIAGNOSIS — I4891 Unspecified atrial fibrillation: Secondary | ICD-10-CM | POA: Diagnosis not present

## 2015-02-22 LAB — BASIC METABOLIC PANEL
BUN/Creatinine Ratio: 17 (ref 10–22)
BUN: 26 mg/dL (ref 8–27)
CALCIUM: 9.3 mg/dL (ref 8.6–10.2)
CO2: 25 mmol/L (ref 18–29)
CREATININE: 1.55 mg/dL — AB (ref 0.76–1.27)
Chloride: 99 mmol/L (ref 97–106)
GFR calc non Af Amer: 41 mL/min/{1.73_m2} — ABNORMAL LOW (ref >59)
GFR, EST AFRICAN AMERICAN: 48 mL/min/{1.73_m2} — AB (ref >59)
Glucose: 269 mg/dL — ABNORMAL HIGH (ref 65–99)
Potassium: 4.1 mmol/L (ref 3.5–5.2)
Sodium: 143 mmol/L (ref 136–144)

## 2015-02-28 ENCOUNTER — Encounter: Payer: Self-pay | Admitting: Internal Medicine

## 2015-03-10 ENCOUNTER — Ambulatory Visit (INDEPENDENT_AMBULATORY_CARE_PROVIDER_SITE_OTHER): Payer: Medicare Other | Admitting: Cardiovascular Disease

## 2015-03-10 ENCOUNTER — Ambulatory Visit: Payer: Medicare Other | Admitting: Pulmonary Disease

## 2015-03-10 ENCOUNTER — Encounter: Payer: Self-pay | Admitting: Pulmonary Disease

## 2015-03-10 ENCOUNTER — Encounter: Payer: Self-pay | Admitting: Cardiovascular Disease

## 2015-03-10 ENCOUNTER — Ambulatory Visit (INDEPENDENT_AMBULATORY_CARE_PROVIDER_SITE_OTHER): Payer: Medicare Other | Admitting: Pulmonary Disease

## 2015-03-10 VITALS — BP 124/82 | HR 58 | Ht 66.0 in | Wt 196.8 lb

## 2015-03-10 VITALS — BP 120/64 | HR 57 | Ht 66.0 in | Wt 196.6 lb

## 2015-03-10 DIAGNOSIS — I10 Essential (primary) hypertension: Secondary | ICD-10-CM | POA: Diagnosis not present

## 2015-03-10 DIAGNOSIS — G4733 Obstructive sleep apnea (adult) (pediatric): Secondary | ICD-10-CM

## 2015-03-10 DIAGNOSIS — Z794 Long term (current) use of insulin: Secondary | ICD-10-CM

## 2015-03-10 DIAGNOSIS — I5032 Chronic diastolic (congestive) heart failure: Secondary | ICD-10-CM | POA: Diagnosis not present

## 2015-03-10 DIAGNOSIS — I63419 Cerebral infarction due to embolism of unspecified middle cerebral artery: Secondary | ICD-10-CM

## 2015-03-10 DIAGNOSIS — I4891 Unspecified atrial fibrillation: Secondary | ICD-10-CM

## 2015-03-10 DIAGNOSIS — I2510 Atherosclerotic heart disease of native coronary artery without angina pectoris: Secondary | ICD-10-CM | POA: Diagnosis not present

## 2015-03-10 DIAGNOSIS — J449 Chronic obstructive pulmonary disease, unspecified: Secondary | ICD-10-CM | POA: Diagnosis not present

## 2015-03-10 DIAGNOSIS — E1165 Type 2 diabetes mellitus with hyperglycemia: Secondary | ICD-10-CM

## 2015-03-10 DIAGNOSIS — R0600 Dyspnea, unspecified: Secondary | ICD-10-CM | POA: Diagnosis not present

## 2015-03-10 DIAGNOSIS — R911 Solitary pulmonary nodule: Secondary | ICD-10-CM | POA: Diagnosis not present

## 2015-03-10 DIAGNOSIS — E785 Hyperlipidemia, unspecified: Secondary | ICD-10-CM

## 2015-03-10 MED ORDER — PREDNISONE 5 MG PO TABS: 5.0000 mg | ORAL_TABLET | Freq: Every day | ORAL | Status: DC

## 2015-03-10 NOTE — Progress Notes (Signed)
Patient ID: Shawn Grace., male    DOB: May 05, 1931, 81 y.o.   MRN: [id number]  HPI Comments: [name] is a pleasant 81 year old gentleman, who has a history of diabetes type 2, coronary artery disease, status post myocardial infarction treated with TPA at [facility] in January 29, 1996.   chronic atrial fibrillation since 2006, on warfarin. Walks with a cane. 0017 had a cva, uncertain if he was on anticoagulation at the time History off falls.  Seen by Dr. Raul Del, pulmonary. Also seen by CHF clinic. Several admissions to the hospital dating back to December 2014 with urine output function, urinary tract infections, status post stent placement by Dr. Elenor Quinones. He presents today for evaluation of his acute on chronic diastolic CHF Poorly controlled diabetes  On his last clinic visit we started torsemide 40 mg twice a day in September 2016 Creatinine and BUN elevated in October, decreased down to 20 mg twice a day Wife and patient reported improved breathing, less abdominal swelling, improved leg edema on today's visit still with some leg edema though Overall trending in the right direction. Weight at home 191 up to 193 pounds Overall they feel like he is on the right track. Seen by pulmonary, they would like to decrease prednisone dosing Weight is down 79 pounds today from his prior clinic visit September 2016  Prior EKGs with atrial fibrillation, old anterior MI, ST abnormality  Other past medical history history of strokes documented on MRI. History of fall, fell backwards when walking with his walker  hospital 06/03/2014 with discharge on the 26th with urinary tract infection requiring antibiotics.   hospital admission 05/02/2014 with acute stroke. He was off warfarin for circumcision in early January 2016. He did have Lovenox bridge MRI showed multiple embolic strokes, left parietal, occipital areas. He was seen by neurology. He reports having continued vision problems.  INR was 2 and he was continued on his warfarin. He is doing home physical therapy. At the time of discharge he was started on diltiazem. He does have chronic lower extremity edema. He has not received the diltiazem yet and is waiting for mail-order.  Recent carotid ultrasound showing bilateral mild plaque dated 05/03/2014 Echocardiogram 05/03/2014 showing normal LV systolic function, normal right ventricular systolic pressure MRI brain showing multiple punctate foci acute infarction left parietal, left occipital, left frontal lobes likely embolic, chronic right occipital infarct  EKG on today's visit shows age fibrillation with ventricular rate 75 bpm, old anterior MI, inferior MI INR 2 days ago was 1.6. INR today 1.8  Hemoglobin A1c elevated at 9.6  hospital admission January 26 2014 with discharge October 20 with urosepsis, bladder outflow obstruction.cultures grew [facility] was called to place a Foley catheter. Initial creatinine 1.56, BUN 25.  Echocardiogram 01/25/2014 showing moderate pulmonary hypertension, normal ejection fraction Notes indicate he had antibiotics, diuresis through his hospital course with improvement of his renal function down to creatinine 1.26. Creatinine was checked 2 days ago and was up to 1.4 with BUN 29 He had a Foley catheter for 10 days which was removed  He fell in January 2015 after taking a nap, he fell into his dresser drawer. Significant trauma to his left arm He fell possibly in April 2015the details are unavailable. Did not hurt himself [place] again recently while in the bathroom. Lost his footing, fell backwards, broke the toilet lid, large left flank hematoma  Myoview in March 2007 showed an ejection fraction of 50% with an old scar at the  apex and mild peri-infarct ischemia.   echocardiogram in January 2008 showed an ejection fraction at the lower limits of normal with moderate LVH, mild aortic root dilatation, and mild mitral regurgitation.   There was biatrial enlargement.   echocardiogram January 2015 showing ejection fraction 60%, moderately dilated left atrium, mild MR and TR, moderate pulmonary hypertension with right ventricular systolic pressure 53   Previous carotid Dopplers in January of 2008 showed 0-39% stenosis bilaterally. Carotid ultrasound may 2009 showing mild bilateral plaque, no significant stenoses  Allergies  Allergen Reactions  . Contrast Media [Iodinated Diagnostic Agents] Shortness Of Breath  . Morphine And Related Other (See Comments)    Hallucinations   . Niacin And Related Dermatitis    Outpatient Encounter Prescriptions as of 03/10/2015  Medication Sig  . acetaminophen (TYLENOL) 650 MG CR tablet Take 650 mg by mouth every 8 (eight) hours as needed.    Marland Kitchen albuterol (2.5 MG/3ML) 0.083% NEBU 3 mL, albuterol (5 MG/ML) 0.5% NEBU 0.5 mL Inhale 1 mg into the lungs.  . ALPRAZolam (XANAX) 0.25 MG tablet Take 1 tablet (0.25 mg total) by mouth at bedtime as needed for anxiety.  Marland Kitchen apixaban (ELIQUIS) 5 MG TABS tablet Take 5 mg by mouth 2 (two) times daily.  Marland Kitchen atorvastatin (LIPITOR) 40 MG tablet TAKE 1 TABLET DAILY  . bimatoprost (LUMIGAN) 0.03 % ophthalmic solution Place 1 drop into both eyes at bedtime.   . budesonide-formoterol (SYMBICORT) 160-4.5 MCG/ACT inhaler Inhale 2 puffs into the lungs 2 (two) times daily.  . citalopram (CELEXA) 10 MG tablet TAKE 1 TABLET DAILY  . finasteride (PROSCAR) 5 MG tablet Take 5 mg by mouth daily.  Marland Kitchen gabapentin (NEURONTIN) 100 MG capsule TAKE 1 CAPSULE THREE TIMES A DAY  . insulin lispro (HUMALOG KWIKPEN) 100 UNIT/ML KiwkPen 18 units with breakfast, 20 units with with lunch and 14 units with supper (Patient taking differently: 18 units with breakfast, 20 units with with lunch and 16 units with supper)  . Insulin Pen Needle 32G X 4 MM MISC Use three times daily for insulin admin Dx E11.65  . Insulin Syringe-Needle U-100 30G X 1/2" 1 ML MISC 1 each by Does not apply route 3  (three) times daily.  Marland Kitchen KLOR-CON M10 10 MEQ tablet TAKE 3 TABLETS DAILY  . LEVEMIR 100 UNIT/ML injection INJECT 28 UNITS TOTAL UNDER THE SKIN AT BEDTIME. ADD 2 UNITS PER DAY UNTIL MORNING SUGAR IS LESS THAN 140  . metoprolol succinate (TOPROL-XL) 50 MG 24 hr tablet Take 1 tablet (50 mg total) by mouth 2 (two) times daily.  . nitroGLYCERIN (NITROSTAT) 0.4 MG SL tablet Place 0.4 mg under the tongue every 5 (five) minutes as needed.    . nystatin-triamcinolone (MYCOLOG II) cream Apply 1 application topically 2 (two) times daily.   . Omega-3 Fatty Acids (FISH OIL) 1000 MG CAPS Take 1 capsule by mouth 3 (three) times daily.   . predniSONE (DELTASONE) 5 MG tablet Take 1 tablet (5 mg total) by mouth daily with breakfast.  . PROAIR HFA 108 (90 BASE) MCG/ACT inhaler INHALE 1 TO 2 PUFFS EVERY 4 HOURS AS NEEDED  . sitaGLIPtin (JANUVIA) 100 MG tablet Take 1 tablet (100 mg total) by mouth daily.  Marland Kitchen SPIRIVA HANDIHALER 18 MCG inhalation capsule INHALE THE CONTENTS OF 1 CAPSULE DAILY  . tamsulosin (FLOMAX) 0.4 MG CAPS capsule TAKE 1 CAPSULE DAILY  . torsemide (DEMADEX) 20 MG tablet Take 20 mg by mouth daily.  . [DISCONTINUED] torsemide (DEMADEX) 20 MG tablet Take 2  tablets (40 mg total) by mouth 2 (two) times daily. (Patient taking differently: Take 20 mg by mouth 2 (two) times daily. )   No facility-administered encounter medications on file as of 03/10/2015.    Past Medical History  Diagnosis Date  . HYPERTENSION   . HYPERLIPIDEMIA   . CAD     a. MI 01/29/1996 tx'd w/ TPA @ [facility]; b. Myoview 06/2005: EF 50%, scar @ apex, mild peri-infarct ischemia  . Chronic atrial fibrillation ([place])     a. since 2006; b. on warfarin  . DM   . History of kidney stones   . Neuropathy of both feet ([place])   . Chronic diastolic CHF (congestive heart failure) ([place])     a. echo 04/2006: EF lower limits of nl, mod LVH, mild aortic root dilatation, & mild MR, biatrial enlargement; b. echo 04/2013: EF 60%, mod dilated LA, mild MR  & TR, mod pulm HTN w/ RV systolic pressure 53, c. echo 04/21/14: EF 55-60%, unable to exclude WMA, severely dilated LA 6.6 cm, nl RVSP, mildly dilated aortic root  . Kidney stone     a. s/p left ureteral stenting 04/24/14  . COPD (chronic obstructive pulmonary disease) ([place])   . GERD (gastroesophageal reflux disease)   . Cancer ([place])     skin  . Arthritis   . CVA 2426,8341    x2  . Falls   . Poor balance     Past Surgical History  Procedure Laterality Date  . Tonsillectomy and adenoidectomy  1959  . Carotid stent insertion  1997  . Cardiac catheterization  1997    [facility]  . Coronary angioplasty  1997    s/p stent placement x 2   . Kidney surgery  05/2013    s/p stent placement   . Bladder surgery      stent placement   . Circumcision  2016  . Stents ureters Bilateral   . Ureteroscopy with holmium laser lithotripsy Left 10/09/2014    Procedure: URETEROSCOPY WITH HOLMIUM LASER LITHOTRIPSY;  Surgeon: Hollice Espy, MD;  Location: [facility] ORS;  Service: Urology;  Laterality: Left;  . Cystoscopy w/ ureteral stent removal Left 10/09/2014    Procedure: CYSTOSCOPY WITH STENT REMOVAL;  Surgeon: Hollice Espy, MD;  Location: [facility] ORS;  Service: Urology;  Laterality: Left;  . Cystoscopy with stent placement Left 10/09/2014    Procedure: CYSTOSCOPY WITH STENT PLACEMENT;  Surgeon: Hollice Espy, MD;  Location: [facility] ORS;  Service: Urology;  Laterality: Left;    Social History  reports that he quit smoking about 24 years ago. His smoking use included Cigarettes. He has a 80 pack-year smoking history. He quit smokeless tobacco use about 24 years ago. He reports that he does not drink alcohol or use illicit drugs.  Family History family history includes Diabetes in his maternal grandmother; Heart disease in his maternal grandmother and mother. There is no history of Cancer or Stroke.  Review of Systems  Constitutional: Negative.   Respiratory: Negative.   Cardiovascular: Positive for leg  swelling.  Gastrointestinal: Negative.   Musculoskeletal: Positive for gait problem.  Neurological: Negative.   Hematological: Negative.   All other systems reviewed and are negative.   BP 124/82 mmHg  Pulse 58  Ht '5\' 6"'$  (1.676 m)  Wt 196 lb 12.8 oz (89.268 kg)  BMI 31.78 kg/m2  Physical Exam  Constitutional: He is oriented to person, place, and time. He appears well-developed and well-nourished.  HENT:  Head: Normocephalic.  Nose: Nose normal.  Mouth/Throat: Oropharynx is clear and moist.  Eyes: Conjunctivae are normal. Pupils are equal, round, and reactive to light.  Neck: Normal range of motion. Neck supple. No JVD present.  Cardiovascular: Normal rate, regular rhythm, S1 normal, S2 normal, normal heart sounds and intact distal pulses.  Exam reveals no gallop and no friction rub.   No murmur heard. trace pitting edema to below the knees  Pulmonary/Chest: Effort normal and breath sounds normal. No respiratory distress. He has no wheezes. He has no rales. He exhibits no tenderness.  Abdominal: Soft. Bowel sounds are normal. He exhibits no distension. There is no tenderness.  Musculoskeletal: Normal range of motion. He exhibits no edema or tenderness.  Lymphadenopathy:    He has no cervical adenopathy.  Neurological: He is alert and oriented to person, place, and time. Coordination normal.  Skin: Skin is warm and dry. No rash noted. No erythema.  Psychiatric: He has a normal mood and affect. His behavior is normal. Judgment and thought content normal.      Assessment and Plan   Nursing note and vitals reviewed.

## 2015-03-10 NOTE — Assessment & Plan Note (Signed)
Currently with no symptoms of angina. No further workup at this time. Continue current medication regimen. 

## 2015-03-10 NOTE — Assessment & Plan Note (Addendum)
Improved fluid status compared to prior visit 2 months ago Weight down 7 pounds. Instructions given below; Please increase the torsemide up to 40 mg in the AM, 20 mg in the PM For weight less than 190, Go to torsemide 20 mg twice a day For weight less than 187, go to torsemide 20 mg once a day Goal weight is 190 to 192

## 2015-03-10 NOTE — Assessment & Plan Note (Signed)
Currently tolerating anticoagulation with no symptoms

## 2015-03-10 NOTE — Patient Instructions (Signed)
You are doing well.  Please increase the torsemide up to 40 mg in the AM, 20 mg in the PM For weight less than 190, Go to torsemide 20 mg twice a day For weight less than 187, go to torsemide 20 mg once a day  Goal weight is 190 to 192  Take miralex one scoop with tablespoon of citracel with water and ice cubes  For constipation  Please call us if you have new issues that need to be addressed before your next appt.  Your physician wants you to follow-up in: 4 months.  You will receive a reminder letter in the mail two months in advance. If you don't receive a letter, please call our office to schedule the follow-up appointment.

## 2015-03-10 NOTE — Assessment & Plan Note (Signed)
Encouraged him to stay on his Lipitor. Goal LDL less than 70 

## 2015-03-10 NOTE — Assessment & Plan Note (Signed)
Blood pressure is well controlled on today's visit. No changes made to the medications. 

## 2015-03-10 NOTE — Assessment & Plan Note (Signed)
Continues to have poorly controlled diabetes. Recommended strict diet control, low carbohydrates

## 2015-03-10 NOTE — Patient Instructions (Signed)
CT scan of chest to re-evaluate lung nodule Decrease prednisone as follows:  5 mg every M,W,F  2.5 mg (1/2 tab) every T,T,S,S Follow up in 6 wks - at that time we will consider decreasing prednisone further

## 2015-03-10 NOTE — Assessment & Plan Note (Signed)
Heart rate relatively well-controlled on current medication regimen. Persistent atrial fibrillation

## 2015-03-11 NOTE — Progress Notes (Signed)
PULMONARY CONSULT NOTE  Requesting MD/Service: self referred.   Date of initial consultation: 03/10/15 Reason for consultation: COPD  HPI:  89 M diagnosed with COPD approx 2004. Previously followed by Dr Raul Del. Wishes to establish care here. At his baseline, he is dyspneic with minimal exertion limited to ambulating no more than 50 meters and less than one flight of stairs. He has little day to day variation. He produces modest amount of white mucus on most days. He has never had hemoptysis. He has been maintained on Symbicort and Spiriva with PRN albuterol which he uses 1-2 times per day on average. He is also on prednisone 5 mg daily which he believes has been very beneficial. His records from Dr Gust Brooms office also indicate a diagnosis of pulmonary fibrosis but his CXR does not seem to demonstrate this and a recent CT abd/pelvis (06/03/14) reveals no fibrosis in the visualized portion of the lung bases. That CT does demonstrate a nodule in the LLL which has not been re-imaged. He also has OSA diagnosed several years ago for which he is on CPAP. He tolerates this well. Lastly, he has chronic AF and is followed by cardiology for this.  Past Medical History  Diagnosis Date  . HYPERTENSION   . HYPERLIPIDEMIA   . CAD     a. MI 01/29/1996 tx'd w/ TPA @ [place]; b. Myoview 06/2005: EF 50%, scar @ apex, mild peri-infarct ischemia  . Chronic atrial fibrillation ([place])     a. since 2006; b. on warfarin  . DM   . History of kidney stones   . Neuropathy of both feet ([place])   . Chronic diastolic CHF (congestive heart failure) ([place])     a. echo 04/2006: EF lower limits of nl, mod LVH, mild aortic root dilatation, & mild MR, biatrial enlargement; b. echo 04/2013: EF 60%, mod dilated LA, mild MR & TR, mod pulm HTN w/ RV systolic pressure 53, c. echo 04/21/14: EF 55-60%, unable to exclude WMA, severely dilated LA 6.6 cm, nl RVSP, mildly dilated aortic root  . Kidney stone     a. s/p left ureteral stenting 04/24/14   . COPD (chronic obstructive pulmonary disease) ([place])   . GERD (gastroesophageal reflux disease)   . Cancer ([place])     skin  . Arthritis   . CVA 0258,5277    x2  . Falls   . Poor balance     MEDICATIONS: reviewed  Social History   Social History  . Marital Status: Married    Spouse Name: N/A  . Number of Children: N/A  . Years of Education: N/A   Occupational History  . Not on file.   Social History Main Topics  . Smoking status: Former Smoker -- 2.00 packs/day for 40 years    Types: Cigarettes    Quit date: 05/24/1990  . Smokeless tobacco: Former Systems developer    Quit date: 05/24/1990  . Alcohol Use: No  . Drug Use: No  . Sexual Activity: Not Currently   Other Topics Concern  . Not on file   Social History Narrative    Family History  Problem Relation Age of Onset  . Heart disease Maternal Grandmother   . Diabetes Maternal Grandmother   . Cancer Neg Hx   . Stroke Neg Hx   . Heart disease Mother     ROS - As above. Otherwise unremarkable  Filed Vitals:   03/10/15 1052  BP: 120/64  Pulse: 57  Height: '5\' 6"'$  (1.676 m)  Weight:  196 lb 9.6 oz (89.177 kg)  SpO2: 96%    EXAM:  Gen: WDWN in NAD HEENT: All WNL Neck: NO LAN, no JVD noted Lungs: Diminished BS, no wheezes or other adventitious sounds Cardiovascular: IRIR, rate controlled, no M noted Abdomen: Obese, soft, NT +BS Ext: symmetric pitting pretibial and ankle edema Neuro: CNs intact, motor/sens grossly intact Skin: No lesions noted   DATA:  BMP Latest Ref Rng 02/21/2015 02/07/2015 01/16/2015  Glucose 65 - 99 mg/dL 269(H) 187(H) 389(H)  BUN 8 - 27 mg/dL 26 25 35(H)  Creatinine 0.76 - 1.27 mg/dL 1.55(H) 1.39(H) 1.59(H)  BUN/Creat Ratio 10 - '22 17 18 22  '$ Sodium 136 - 144 mmol/L 143 141 141  Potassium 3.5 - 5.2 mmol/L 4.1 3.6 3.6  Chloride 97 - 106 mmol/L 99 98 95(L)  CO2 18 - 29 mmol/L '25 25 24  '$ Calcium 8.6 - 10.2 mg/dL 9.3 8.7 9.2    CBC Latest Ref Rng 10/09/2014 10/09/2014 10/01/2014  WBC 3.8 -  10.6 K/uL 21.0(H) 14.1(H) 19.0(H)  Hemoglobin 13.0 - 18.0 g/dL 14.1 13.6 13.5  Hematocrit 40.0 - 52.0 % 45.2 43.4 42.3  Platelets 150 - 440 K/uL 244 237 260   CT abd/pelvis 06/03/14: 8.5 mm LLL nodule   From Dr Gust Brooms notes:: LUNG VOLUMES: TLC was 56% of predicted RV was 50% of predicted DLCO was 78% of predicted DLCO/VA was 151% of predicted  It does not appear that spirometry was performed  IMPRESSION:   Chronic obstructive pulmonary disease, unspecified COPD type ([place]) Obesity Restrictive physiology - doubt PF. Likely due to obesity Severe dyspnea - likely multifactorial including COPD, CAF, obesity, deconditioning Lung nodule on CTAP 06/03/14 OSA - well controlled on CPAP DM2 - hyperglycemia on most recent BMET  I am concerned about chronic steroids which are likely worsening DM control and obesity. However, the pt is convinced that it has been beneficial and we will continue it for now  PLAN:  Cont Symbicort and Spiriva as controller regimen Cont prednisone @ 5 mg daily for now Cont PRN albuterol CT chest to re-eval pulmonary nodule in LLL ROV in 6 weeks   Merton Border, MD PCCM service Mobile [phone] Pager [phone]

## 2015-04-01 ENCOUNTER — Other Ambulatory Visit: Payer: Self-pay | Admitting: Internal Medicine

## 2015-04-18 ENCOUNTER — Ambulatory Visit
Admission: RE | Admit: 2015-04-18 | Discharge: 2015-04-18 | Disposition: A | Discharge status: Home / Self Care | Payer: Medicare Other | Source: Ambulatory Visit | Attending: Pulmonary Disease | Admitting: Pulmonary Disease

## 2015-04-18 DIAGNOSIS — I2510 Atherosclerotic heart disease of native coronary artery without angina pectoris: Secondary | ICD-10-CM | POA: Insufficient documentation

## 2015-04-18 DIAGNOSIS — R911 Solitary pulmonary nodule: Secondary | ICD-10-CM | POA: Diagnosis present

## 2015-04-18 DIAGNOSIS — R918 Other nonspecific abnormal finding of lung field: Secondary | ICD-10-CM | POA: Insufficient documentation

## 2015-04-21 ENCOUNTER — Encounter: Payer: Self-pay | Admitting: *Deleted

## 2015-04-21 ENCOUNTER — Ambulatory Visit: Payer: Medicare Other | Admitting: Pulmonary Disease

## 2015-04-23 ENCOUNTER — Encounter: Payer: Self-pay | Admitting: Pulmonary Disease

## 2015-04-23 ENCOUNTER — Ambulatory Visit (INDEPENDENT_AMBULATORY_CARE_PROVIDER_SITE_OTHER): Payer: Medicare Other | Admitting: Pulmonary Disease

## 2015-04-23 VITALS — BP 132/76 | HR 66 | Ht 66.0 in | Wt 203.0 lb

## 2015-04-23 DIAGNOSIS — E669 Obesity, unspecified: Secondary | ICD-10-CM

## 2015-04-23 DIAGNOSIS — R911 Solitary pulmonary nodule: Secondary | ICD-10-CM | POA: Diagnosis not present

## 2015-04-23 DIAGNOSIS — R0600 Dyspnea, unspecified: Secondary | ICD-10-CM

## 2015-04-23 DIAGNOSIS — J8410 Pulmonary fibrosis, unspecified: Secondary | ICD-10-CM

## 2015-04-23 NOTE — Patient Instructions (Addendum)
You do have mild fibrosis or scarring in your lungs. This does not warrant any further evaluation You have a small lung nodule in your left lung that has been stable over one year's time and does not require further evaluation We talked about weight loss strategies You may stop Symbicort. Continue Spiriva for now Follow up in 4 months   Goal weight 195 lbs  We will discuss possibly stopping Spiriva when you follow up

## 2015-04-24 NOTE — Progress Notes (Addendum)
PULMONARY OFFICE FOLLOW UP NOTE  Date of initial consultation: 03/10/15 Reason for consultation: COPD Initial HPI:  78 M diagnosed with COPD approx 2004. Previously followed by Dr Raul Del. Wishes to establish care here. At his baseline, he is dyspneic with minimal exertion limited to ambulating no more than 50 meters and less than one flight of stairs. He has little day to day variation. He produces modest amount of white mucus on most days. He has never had hemoptysis. He has been maintained on Symbicort and Spiriva with PRN albuterol which he uses 1-2 times per day on average. He is also on prednisone 5 mg daily which he believes has been very beneficial. His records from Dr Gust Brooms office also indicate a diagnosis of pulmonary fibrosis but his CXR does not seem to demonstrate this and a recent CT abd/pelvis (06/03/14) reveals no fibrosis in the visualized portion of the lung bases. That CT does demonstrate a nodule in the LLL which has not been re-imaged. He also has OSA diagnosed several years ago for which he is on CPAP. He tolerates this well. Lastly, he has chronic AF and is followed by cardiology for this.  Initial IMP/PLAN: COPD Obesity Restrictive physiology - doubt PF. Likely due to obesity Severe dyspnea - likely multifactorial including COPD, CAF, obesity, deconditioning Lung nodule on CTAP 06/03/14 OSA - well controlled on CPAP DM2 - hyperglycemia on most recent BMET PLAN:  Cont Symbicort and Spiriva as controller regimen Cont prednisone @ 5 mg daily for now Cont PRN albuterol CT chest to re-eval pulmonary nodule in LLL ROV in 6 weeks  SUBJ: Continues to have dyspnea on modest exertion, NSC. No new complaints. Her to review follow up CT chest  OBJ:   Filed Vitals:   04/23/15 1151  BP: 132/76  Pulse: 66  Height: '5\' 6"'$  (1.676 m)  Weight: 203 lb (92.08 kg)  SpO2: 96%    EXAM:  Gen: WDWN in NAD HEENT: All WNL Lungs: Diminished BS, no wheezes Cardiovascular: IRIR, no  M Abdomen: Obese, soft, NT +BS Ext: mild pitting pretibial edema  DATA: CT CHEST 04/18/15: Interstitial prominence with upper lobe predominance. 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 Although this isn't long enough to conclude benignancy, there is no benefit to following it further as he would not be a candidate for resection OSA - well controlled on CPAP DM2   PLAN:  Cont Symbicort and Spiriva as controller regimen Cont prednisone @ 5 mg daily  Cont PRN albuterol We discussed wt loss strategies ROV 3 months  Merton Border, MD PCCM service Mobile [phone] Pager [phone]

## 2015-04-30 ENCOUNTER — Ambulatory Visit (INDEPENDENT_AMBULATORY_CARE_PROVIDER_SITE_OTHER): Payer: Medicare Other | Admitting: Internal Medicine

## 2015-04-30 ENCOUNTER — Encounter: Payer: Self-pay | Admitting: Internal Medicine

## 2015-04-30 VITALS — BP 116/60 | HR 76 | Temp 98.2°F | Wt 197.0 lb

## 2015-04-30 DIAGNOSIS — E1141 Type 2 diabetes mellitus with diabetic mononeuropathy: Secondary | ICD-10-CM | POA: Diagnosis not present

## 2015-04-30 DIAGNOSIS — Z794 Long term (current) use of insulin: Secondary | ICD-10-CM

## 2015-04-30 LAB — HEMOGLOBIN A1C: Hgb A1c MFr Bld: 9.5 % — ABNORMAL HIGH (ref 4.6–6.5)

## 2015-04-30 NOTE — Progress Notes (Signed)
Subjective:    Patient ID: [name]., male    DOB: 08-Jan-1932, 82 y.o.   MRN: [id number]  HPI  Pt presents to the clinic today for follow up DM 2. He reports his fasting sugars range 180-220. He is eating rice cakes and popcorn before bed. He has stopped eating ice cream before bed. He is taking Levemir 42 units daily. He is taking Humalog 18, 20, 16 units respectively. He is on Januvia as well. His last A1C was 9.6% (01/2015). He denies having any lows. He denies headaches. He does have polydipsia, polyuria, numbness and tingling in his feet. His last eye exam 03/2015. His flu and pneumonia vaccines are UTD.  He does request refills of his Xanax and Humalog today as well.  Review of Systems      Past Medical History  Diagnosis Date  . HYPERTENSION   . HYPERLIPIDEMIA   . CAD     a. MI 01/29/1996 tx'd w/ TPA @ [place]; b. Myoview 06/2005: EF 50%, scar @ apex, mild peri-infarct ischemia  . Chronic atrial fibrillation ([place])     a. since 2006; b. on warfarin  . DM   . History of kidney stones   . Neuropathy of both feet ([place])   . Chronic diastolic CHF (congestive heart failure) ([place])     a. echo 04/2006: EF lower limits of nl, mod LVH, mild aortic root dilatation, & mild MR, biatrial enlargement; b. echo 04/2013: EF 60%, mod dilated LA, mild MR & TR, mod pulm HTN w/ RV systolic pressure 53, c. echo 04/21/14: EF 55-60%, unable to exclude WMA, severely dilated LA 6.6 cm, nl RVSP, mildly dilated aortic root  . Kidney stone     a. s/p left ureteral stenting 04/24/14  . COPD (chronic obstructive pulmonary disease) ([place])   . GERD (gastroesophageal reflux disease)   . Cancer ([place])     skin  . Arthritis   . CVA [id number]    x2  . Falls   . Poor balance     Current Outpatient Prescriptions  Medication Sig Dispense Refill  . acetaminophen (TYLENOL) 650 MG CR tablet Take 650 mg by mouth every 8 (eight) hours as needed.      Marland Kitchen albuterol (2.5 MG/3ML) 0.083% NEBU 3 mL, albuterol (5  MG/ML) 0.5% NEBU 0.5 mL Inhale 1 mg into the lungs.    . ALPRAZolam (XANAX) 0.25 MG tablet Take 1 tablet (0.25 mg total) by mouth at bedtime as needed for anxiety. 90 tablet 0  . apixaban (ELIQUIS) 5 MG TABS tablet Take 5 mg by mouth 2 (two) times daily.    Marland Kitchen atorvastatin (LIPITOR) 40 MG tablet TAKE 1 TABLET DAILY 90 tablet 3  . bimatoprost (LUMIGAN) 0.03 % ophthalmic solution Place 1 drop into both eyes at bedtime.     . citalopram (CELEXA) 10 MG tablet TAKE 1 TABLET DAILY 90 tablet 1  . finasteride (PROSCAR) 5 MG tablet Take 5 mg by mouth daily.    Marland Kitchen gabapentin (NEURONTIN) 100 MG capsule TAKE 1 CAPSULE THREE TIMES A DAY 270 capsule 1  . insulin lispro (HUMALOG KWIKPEN) 100 UNIT/ML KiwkPen Inject into the skin. 18 units with breakfast, 20 units with with lunch and 16 units with supper    . Insulin Pen Needle 32G X 4 MM MISC Use three times daily for insulin admin Dx E11.65 270 each 1  . Insulin Syringe-Needle U-100 30G X 1/2" 1 ML MISC 1 each by Does not apply route 3 (  three) times daily. 200 each 5  . KLOR-CON M10 10 MEQ tablet TAKE 3 TABLETS DAILY 270 tablet 3  . LEVEMIR 100 UNIT/ML injection INJECT 28 UNITS TOTAL UNDER THE SKIN AT BEDTIME. ADD 2 UNITS PER DAY UNTIL MORNING SUGAR IS LESS THAN 140 3 vial 5  . metoprolol succinate (TOPROL-XL) 50 MG 24 hr tablet Take 1 tablet (50 mg total) by mouth 2 (two) times daily. 180 tablet 1  . nitroGLYCERIN (NITROSTAT) 0.4 MG SL tablet Place 0.4 mg under the tongue every 5 (five) minutes as needed.      . nystatin-triamcinolone (MYCOLOG II) cream Apply 1 application topically 2 (two) times daily.     . Omega-3 Fatty Acids (FISH OIL) 1000 MG CAPS Take 1 capsule by mouth 3 (three) times daily.     . predniSONE (DELTASONE) 5 MG tablet Take 1 tablet (5 mg total) by mouth daily with breakfast. 90 tablet 5  . PROAIR HFA 108 (90 BASE) MCG/ACT inhaler INHALE 1 TO 2 PUFFS EVERY 4 HOURS AS NEEDED 25.5 g 4  . sitaGLIPtin (JANUVIA) 100 MG tablet Take 1 tablet (100 mg  total) by mouth daily. 14 tablet 0  . SPIRIVA HANDIHALER 18 MCG inhalation capsule INHALE THE CONTENTS OF 1 CAPSULE DAILY 90 capsule 3  . tamsulosin (FLOMAX) 0.4 MG CAPS capsule TAKE 1 CAPSULE DAILY 90 capsule 1  . torsemide (DEMADEX) 20 MG tablet Take 40-80 mg by mouth daily.      No current facility-administered medications for this visit.    Allergies  Allergen Reactions  . Contrast Media [Iodinated Diagnostic Agents] Shortness Of Breath  . Morphine And Related Other (See Comments)    Hallucinations   . Niacin And Related Dermatitis    Family History  Problem Relation Age of Onset  . Heart disease Maternal Grandmother   . Diabetes Maternal Grandmother   . Cancer Neg Hx   . Stroke Neg Hx   . Heart disease Mother     Social History   Social History  . Marital Status: Married    Spouse Name: N/A  . Number of Children: N/A  . Years of Education: N/A   Occupational History  . Not on file.   Social History Main Topics  . Smoking status: Former Smoker -- 2.00 packs/day for 40 years    Types: Cigarettes    Quit date: 05/24/1990  . Smokeless tobacco: Former Systems developer    Quit date: 05/24/1990  . Alcohol Use: No  . Drug Use: No  . Sexual Activity: Not Currently   Other Topics Concern  . Not on file   Social History Narrative     Constitutional: Denies fever, malaise, fatigue, headache or abrupt weight changes.  Respiratory: Denies difficulty breathing, shortness of breath, cough or sputum production.   Cardiovascular: Denies chest pain, chest tightness, palpitations or swelling in the hands or feet.  Gastrointestinal: Pt reports increased thirst. Denies abdominal pain, bloating, constipation, diarrhea or blood in the stool.  GU: Pt reports urinary frequency. Denies urgency, pain with urination, burning sensation, blood in urine, odor or discharge. Skin: Pt reports skin tears- healing well. Denies redness, rashes, lesions or ulcercations.  Neurological: Pt reports  numbness and tingling in his hands and feet. Denies dizziness, difficulty with memory, difficulty with speech.  Psych: Pt has history of anxiety. Denies depression, SI/HI.  No other specific complaints in a complete review of systems (except as listed in HPI above).  Objective:   Physical Exam   BP 116/60 mmHg  Pulse 76  Temp(Src) 98.2 F (36.8 C) (Oral)  Wt 197 lb (89.359 kg)  SpO2 95% Wt Readings from Last 3 Encounters:  04/30/15 197 lb (89.359 kg)  04/23/15 203 lb (92.08 kg)  03/10/15 196 lb 12.8 oz (89.268 kg)   General: Appears his stated age, obese, chronically ill appearing, in NAD. Cardiovascular: Normal rate and rhythm. S1,S2 noted.  No murmur, rubs or gallops noted. 1+ pitting BLE edema.   Pulmonary/Chest: Normal effort and positive vesicular breath sounds. No respiratory distress. No wheezes, rales or ronchi noted.  Abdomen: Distended but soft and nontender. Normal bowel sounds. No distention or masses noted. Liver, spleen and kidneys non palpable. Neurological: Alert and oriented.  Psychiatric: Mood and affect normal. Behavior is normal. Judgment and thought content normal.    BMET    Component Value Date/Time   NA 143 02/21/2015 1108   NA 144 10/09/2014 2249   NA 137 08/05/2014 1839   K 4.1 02/21/2015 1108   K 3.6 08/05/2014 1839   CL 99 02/21/2015 1108   CL 106 08/05/2014 1839   CO2 25 02/21/2015 1108   CO2 23 08/05/2014 1839   GLUCOSE 269* 02/21/2015 1108   GLUCOSE 122* 10/09/2014 2249   GLUCOSE 235* 08/05/2014 1839   BUN 26 02/21/2015 1108   BUN 22* 10/09/2014 2249   BUN 19 08/05/2014 1839   CREATININE 1.55* 02/21/2015 1108   CREATININE 1.20 08/05/2014 1839   CALCIUM 9.3 02/21/2015 1108   CALCIUM 8.6* 08/05/2014 1839   GFRNONAA 41* 02/21/2015 1108   GFRNONAA 57* 08/05/2014 1839   GFRNONAA >60 05/06/2014 0406   GFRAA 48* 02/21/2015 1108   GFRAA >60 08/05/2014 1839   GFRAA >60 05/06/2014 0406    Lipid Panel     Component Value Date/Time    CHOL 107 10/04/2014 0940   TRIG 83.0 10/04/2014 0940   HDL 43.10 10/04/2014 0940   CHOLHDL 2 10/04/2014 0940   VLDL 16.6 10/04/2014 0940   LDLCALC 47 10/04/2014 0940    CBC    Component Value Date/Time   WBC 21.0* 10/09/2014 2249   WBC 17.2* 08/05/2014 1839   RBC 5.41 10/09/2014 2249   RBC 4.86 08/05/2014 1839   HGB 14.1 10/09/2014 2249   HGB 13.3 08/05/2014 1839   HCT 45.2 10/09/2014 2249   HCT 40.1 08/05/2014 1839   PLT 244 10/09/2014 2249   PLT 246 08/05/2014 1839   MCV 83.5 10/09/2014 2249   MCV 83 08/05/2014 1839   MCH 26.1 10/09/2014 2249   MCH 27.3 08/05/2014 1839   MCHC 31.2* 10/09/2014 2249   MCHC 33.1 08/05/2014 1839   RDW 16.4* 10/09/2014 2249   RDW 16.3* 08/05/2014 1839   LYMPHSABS 1.7 10/09/2014 2249   LYMPHSABS 2.3 08/05/2014 1839   MONOABS 2.0* 10/09/2014 2249   MONOABS 2.0* 08/05/2014 1839   EOSABS 0.0 10/09/2014 2249   EOSABS 0.1 08/05/2014 1839   BASOSABS 0.1 10/09/2014 2249   BASOSABS 0.1 08/05/2014 1839    Hgb A1C Lab Results  Component Value Date   HGBA1C 9.6* 01/20/2015        Assessment & Plan:   DM 2 with peripheral neuropathy:  Discussed eating high protein low carb snacks Handout given on carb counting Will check A1C today Continue Lantus, Humalog and Januvia If A1C > 8, consider adding Glipizide Flu and pneumonia vaccines UTD Eye exam UTD  RTC in 3 months, sooner if needed

## 2015-04-30 NOTE — Progress Notes (Signed)
Pre visit review using our clinic review tool, if applicable. No additional management support is needed unless otherwise documented below in the visit note. 

## 2015-04-30 NOTE — Patient Instructions (Signed)

## 2015-05-01 ENCOUNTER — Other Ambulatory Visit: Payer: Self-pay | Admitting: *Deleted

## 2015-05-01 MED ORDER — GLIPIZIDE 10 MG PO TABS: 10.0000 mg | ORAL_TABLET | Freq: Two times a day (BID) | ORAL | Status: DC

## 2015-05-02 ENCOUNTER — Other Ambulatory Visit: Payer: Self-pay

## 2015-05-02 MED ORDER — INSULIN LISPRO 100 UNIT/ML (KWIKPEN): PEN_INJECTOR | SUBCUTANEOUS | Status: DC

## 2015-05-05 ENCOUNTER — Telehealth: Payer: Self-pay | Admitting: *Deleted

## 2015-05-05 MED ORDER — PREDNISONE 5 MG PO TABS: 5.0000 mg | ORAL_TABLET | Freq: Every day | ORAL | Status: DC

## 2015-05-05 NOTE — Telephone Encounter (Signed)
LM to inform pt medication has been sent. Nothing further needed.

## 2015-05-05 NOTE — Telephone Encounter (Signed)
°*  STAT* If patient is at the pharmacy, call can be transferred to refill team.   1. Which medications need to be refilled? (please list name of each medication and dose if known) Prednisone 5 mg   2. Which pharmacy/location (including street and city if local pharmacy) is medication to be sent to[facility]  3. Do they need a 30 day or 90 day supply? Would like 90 day please.

## 2015-05-15 ENCOUNTER — Other Ambulatory Visit: Payer: Self-pay | Admitting: Internal Medicine

## 2015-05-20 ENCOUNTER — Ambulatory Visit: Payer: Medicare Other | Admitting: Family

## 2015-06-12 ENCOUNTER — Encounter: Payer: Self-pay | Admitting: Family

## 2015-06-12 ENCOUNTER — Ambulatory Visit: Payer: Medicare Other | Attending: Family | Admitting: Family

## 2015-06-12 VITALS — BP 126/52 | HR 61 | Resp 18 | Ht 66.0 in | Wt 199.0 lb

## 2015-06-12 DIAGNOSIS — Z8673 Personal history of transient ischemic attack (TIA), and cerebral infarction without residual deficits: Secondary | ICD-10-CM | POA: Insufficient documentation

## 2015-06-12 DIAGNOSIS — I10 Essential (primary) hypertension: Secondary | ICD-10-CM | POA: Insufficient documentation

## 2015-06-12 DIAGNOSIS — M1990 Unspecified osteoarthritis, unspecified site: Secondary | ICD-10-CM | POA: Insufficient documentation

## 2015-06-12 DIAGNOSIS — E119 Type 2 diabetes mellitus without complications: Secondary | ICD-10-CM | POA: Diagnosis not present

## 2015-06-12 DIAGNOSIS — E785 Hyperlipidemia, unspecified: Secondary | ICD-10-CM | POA: Insufficient documentation

## 2015-06-12 DIAGNOSIS — Z7901 Long term (current) use of anticoagulants: Secondary | ICD-10-CM | POA: Insufficient documentation

## 2015-06-12 DIAGNOSIS — K219 Gastro-esophageal reflux disease without esophagitis: Secondary | ICD-10-CM | POA: Insufficient documentation

## 2015-06-12 DIAGNOSIS — Z87891 Personal history of nicotine dependence: Secondary | ICD-10-CM | POA: Insufficient documentation

## 2015-06-12 DIAGNOSIS — J449 Chronic obstructive pulmonary disease, unspecified: Secondary | ICD-10-CM | POA: Insufficient documentation

## 2015-06-12 DIAGNOSIS — Z885 Allergy status to narcotic agent status: Secondary | ICD-10-CM | POA: Insufficient documentation

## 2015-06-12 DIAGNOSIS — I4891 Unspecified atrial fibrillation: Secondary | ICD-10-CM | POA: Insufficient documentation

## 2015-06-12 DIAGNOSIS — Z794 Long term (current) use of insulin: Secondary | ICD-10-CM | POA: Insufficient documentation

## 2015-06-12 DIAGNOSIS — Z87442 Personal history of urinary calculi: Secondary | ICD-10-CM | POA: Insufficient documentation

## 2015-06-12 DIAGNOSIS — Z888 Allergy status to other drugs, medicaments and biological substances status: Secondary | ICD-10-CM | POA: Insufficient documentation

## 2015-06-12 DIAGNOSIS — Z9889 Other specified postprocedural states: Secondary | ICD-10-CM | POA: Diagnosis not present

## 2015-06-12 DIAGNOSIS — I482 Chronic atrial fibrillation, unspecified: Secondary | ICD-10-CM

## 2015-06-12 DIAGNOSIS — I5032 Chronic diastolic (congestive) heart failure: Secondary | ICD-10-CM | POA: Diagnosis not present

## 2015-06-12 DIAGNOSIS — Z79899 Other long term (current) drug therapy: Secondary | ICD-10-CM | POA: Diagnosis not present

## 2015-06-12 DIAGNOSIS — I2510 Atherosclerotic heart disease of native coronary artery without angina pectoris: Secondary | ICD-10-CM | POA: Insufficient documentation

## 2015-06-12 NOTE — Progress Notes (Signed)
Subjective:    Patient ID: [name]., male    DOB: March 24, 1932, 82 y.o.   MRN: [id number]  Congestive Heart Failure Presents for follow-up visit. The disease course has been stable. Associated symptoms include edema, fatigue and shortness of breath. Pertinent negatives include no abdominal pain, chest pain, orthopnea or palpitations. The symptoms have been stable. Past treatments include beta blockers and salt and fluid restriction. The treatment provided moderate relief. Compliance with prior treatments has been good. His past medical history is significant for arrhythmia, CAD, chronic lung disease, DM and HTN. He has one 2nd degree relative and one 1st degree relative with heart disease. Compliance with total regimen is 76-100%.  Atrial Fibrillation Presents for follow-up visit. Symptoms include bradycardia, dizziness and shortness of breath. Symptoms are negative for chest pain and palpitations. The symptoms have been stable. Past treatments include anticoagulant and beta blockers. Compliance with prior treatments has been good. Past medical history includes atrial fibrillation, CAD, CHF and HTN. There are no medication compliance problems.    Past Medical History  Diagnosis Date  . HYPERTENSION   . HYPERLIPIDEMIA   . CAD     a. MI 01/29/1996 tx'd w/ TPA @ [place]; b. Myoview 06/2005: EF 50%, scar @ apex, mild peri-infarct ischemia  . Chronic atrial fibrillation ([place])     a. since 2006; b. on warfarin  . DM   . History of kidney stones   . Neuropathy of both feet ([place])   . Chronic diastolic CHF (congestive heart failure) ([place])     a. echo 04/2006: EF lower limits of nl, mod LVH, mild aortic root dilatation, & mild MR, biatrial enlargement; b. echo 04/2013: EF 60%, mod dilated LA, mild MR & TR, mod pulm HTN w/ RV systolic pressure 53, c. echo 04/21/14: EF 55-60%, unable to exclude WMA, severely dilated LA 6.6 cm, nl RVSP, mildly dilated aortic root  . Kidney stone     a. s/p left  ureteral stenting 04/24/14  . COPD (chronic obstructive pulmonary disease) ([place])   . GERD (gastroesophageal reflux disease)   . Cancer ([place])     skin  . Arthritis   . CVA [id number]    x2  . Falls   . Poor balance     Past Surgical History  Procedure Laterality Date  . Tonsillectomy and adenoidectomy  1959  . Carotid stent insertion  1997  . Cardiac catheterization  1997    [facility]  . Coronary angioplasty  1997    s/p stent placement x 2   . Kidney surgery  05/2013    s/p stent placement   . Bladder surgery      stent placement   . Circumcision  2016  . Stents ureters Bilateral   . Ureteroscopy with holmium laser lithotripsy Left 10/09/2014    Procedure: URETEROSCOPY WITH HOLMIUM LASER LITHOTRIPSY;  Surgeon: Hollice Espy, MD;  Location: [facility];  Service: Urology;  Laterality: Left;  . Cystoscopy w/ ureteral stent removal Left 10/09/2014    Procedure: CYSTOSCOPY WITH STENT REMOVAL;  Surgeon: Hollice Espy, MD;  Location: [facility];  Service: Urology;  Laterality: Left;  . Cystoscopy with stent placement Left 10/09/2014    Procedure: CYSTOSCOPY WITH STENT PLACEMENT;  Surgeon: Hollice Espy, MD;  Location: [facility];  Service: Urology;  Laterality: Left;    Family History  Problem Relation Age of Onset  . Heart disease Maternal Grandmother   . Diabetes Maternal Grandmother   . Cancer Neg Hx   .  Stroke Neg Hx   . Heart disease Mother     Social History  Substance Use Topics  . Smoking status: Former Smoker -- 2.00 packs/day for 40 years    Types: Cigarettes    Quit date: 05/24/1990  . Smokeless tobacco: Former Systems developer    Quit date: 05/24/1990  . Alcohol Use: No    Allergies  Allergen Reactions  . Contrast Media [Iodinated Diagnostic Agents] Shortness Of Breath  . Morphine And Related Other (See Comments)    Hallucinations   . Niacin And Related Dermatitis    Prior to Admission medications   Medication Sig Start Date End Date Taking? Authorizing Provider   acetaminophen (TYLENOL) 650 MG CR tablet Take 650 mg by mouth every 8 (eight) hours as needed.     Yes Historical Provider, MD  albuterol (2.5 MG/3ML) 0.083% NEBU 3 mL, albuterol (5 MG/ML) 0.5% NEBU 0.5 mL Inhale 1 mg into the lungs.   Yes Historical Provider, MD  ALPRAZolam (XANAX) 0.25 MG tablet Take 1 tablet (0.25 mg total) by mouth at bedtime as needed for anxiety. 11/05/14  Yes Jearld Fenton, NP  apixaban (ELIQUIS) 5 MG TABS tablet Take 5 mg by mouth 2 (two) times daily.   Yes Historical Provider, MD  atorvastatin (LIPITOR) 40 MG tablet TAKE 1 TABLET DAILY 10/25/14  Yes Alisa Graff, FNP  bimatoprost (LUMIGAN) 0.03 % ophthalmic solution Place 1 drop into both eyes at bedtime.    Yes Historical Provider, MD  citalopram (CELEXA) 10 MG tablet TAKE 1 TABLET DAILY 12/27/14  Yes Jearld Fenton, NP  finasteride (PROSCAR) 5 MG tablet Take 5 mg by mouth daily.   Yes Historical Provider, MD  gabapentin (NEURONTIN) 100 MG capsule TAKE 1 CAPSULE THREE TIMES A DAY 04/01/15  Yes Jearld Fenton, NP  glimepiride (AMARYL) 4 MG tablet Take 8 mg by mouth 2 (two) times daily.   Yes Historical Provider, MD  insulin lispro (HUMALOG KWIKPEN) 100 UNIT/ML KiwkPen 18 units with breakfast, 20 units with with lunch and 16 units with dinner 05/02/15  Yes Jearld Fenton, NP  Insulin Pen Needle 32G X 4 MM MISC Use three times daily for insulin admin Dx E11.65 01/10/15  Yes Jearld Fenton, NP  Insulin Syringe-Needle U-100 30G X 1/2" 1 ML MISC 1 each by Does not apply route 3 (three) times daily. 07/19/14  Yes Jearld Fenton, NP  JANUVIA 100 MG tablet TAKE 1 TABLET DAILY 05/15/15  Yes Jearld Fenton, NP  KLOR-CON M10 10 MEQ tablet TAKE 3 TABLETS DAILY 12/27/14  Yes Minna Merritts, MD  LEVEMIR 100 UNIT/ML injection INJECT 28 UNITS TOTAL UNDER THE SKIN AT BEDTIME. ADD 2 UNITS PER DAY UNTIL MORNING SUGAR IS LESS THAN 140 09/05/14  Yes Jearld Fenton, NP  metoprolol succinate (TOPROL-XL) 50 MG 24 hr tablet Take 1 tablet (50 mg total)  by mouth 2 (two) times daily. 01/10/15  Yes Jearld Fenton, NP  nitroGLYCERIN (NITROSTAT) 0.4 MG SL tablet Place 0.4 mg under the tongue every 5 (five) minutes as needed.     Yes Historical Provider, MD  nystatin-triamcinolone (MYCOLOG II) cream Apply 1 application topically 2 (two) times daily.  03/12/14  Yes Historical Provider, MD  Omega-3 Fatty Acids (FISH OIL) 1000 MG CAPS Take 1 capsule by mouth 3 (three) times daily.    Yes Historical Provider, MD  predniSONE (DELTASONE) 5 MG tablet Take 1 tablet (5 mg total) by mouth daily with breakfast. 05/05/15  Yes  Wilhelmina Mcardle, MD  PROAIR HFA 108 [phone] BASE) MCG/ACT inhaler INHALE 1 TO 2 PUFFS EVERY 4 HOURS AS NEEDED 10/21/14  Yes Alisa Graff, FNP  SPIRIVA HANDIHALER 18 MCG inhalation capsule INHALE THE CONTENTS OF 1 CAPSULE DAILY 02/11/15  Yes Alisa Graff, FNP  tamsulosin (FLOMAX) 0.4 MG CAPS capsule TAKE 1 CAPSULE DAILY 04/01/15  Yes Jearld Fenton, NP  torsemide (DEMADEX) 20 MG tablet Take 40-80 mg by mouth daily.    Yes Historical Provider, MD     Review of Systems  Constitutional: Positive for fatigue. Negative for appetite change.  HENT: Positive for hearing loss and rhinorrhea. Negative for congestion and sore throat.   Eyes: Negative.   Respiratory: Positive for shortness of breath. Negative for cough, chest tightness and wheezing.   Cardiovascular: Positive for leg swelling (goes down overnight). Negative for chest pain and palpitations.  Gastrointestinal: Positive for abdominal distention. Negative for abdominal pain.  Endocrine: Negative.   Genitourinary: Negative.   Musculoskeletal: Positive for neck pain. Negative for back pain.  Skin: Negative.   Allergic/Immunologic: Negative.   Neurological: Positive for dizziness and light-headedness (with changing position too quickly). Negative for headaches.  Hematological: Negative for adenopathy. Bruises/bleeds easily.  Psychiatric/Behavioral: Negative for sleep disturbance (wearing CPAP  nightly) and dysphoric mood. The patient is not nervous/anxious.        Objective:   Physical Exam  Constitutional: He is oriented to person, place, and time. He appears well-developed and well-nourished.  HENT:  Head: Normocephalic and atraumatic.  Eyes: Conjunctivae are normal. Pupils are equal, round, and reactive to light.  Neck: Normal range of motion. Neck supple.  Cardiovascular: An irregular rhythm present. Bradycardia present.   Pulmonary/Chest: Effort normal. He has no wheezes. He has no rales.  Abdominal: Soft. He exhibits distension. There is no tenderness.  Musculoskeletal: He exhibits edema (1+ bilateral pedal edema). He exhibits no tenderness.  Neurological: He is alert and oriented to person, place, and time.  Skin: Skin is warm and dry.  Psychiatric: He has a normal mood and affect. His behavior is normal. Thought content normal.  Nursing note and vitals reviewed.   BP 126/52 mmHg  Pulse 61  Resp 18  Ht '5\' 6"'$  (1.676 m)  Wt 199 lb (90.266 kg)  BMI 32.13 kg/m2  SpO2 98%       Assessment & Plan:  1: Chronic heart failure with preserved ejection fraction- Patient presents with fatigue and shortness of breath upon exertion. When he does experience symptoms, he will stop what he's doing to rest until his symptoms improve. He continues to have edema in his lower legs but says that they do go down overnight. He takes 60-'80mg'$  of torsemide daily depending on symptoms and weight. He continues to weigh himself daily and says that his weight has been stable. By our scale, his weight is unchanged. Reminded him to call for an overnight weight gain of >2 pounds or a weekly weight gain of >5 pounds. He is not adding any salt to his food and continues to follow a low sodium diet. He has received his flu vaccine for his season. [facility] went in and reviewed medications with the patient.  2: COPD- Has recently seen his pulmonologist and continues to use his inhalers. Also wears CPAP  nightly. 3: Atrial fibrillation- Currently rate controlled at this time. Takes eliquis and metoprolol.  4: Diabetes- He says that his fasting glucose ranges from 90-120's but that he also has experienced some low's.  He says that he stopped the glipizide on his own and resumed amaryl. He started off taking '12mg'$  BID until last week when he reduced it to '8mg'$  BID. Discussed that he shouldn't be changing his medications without a discussion with his PCP and that '8mg'$  daily is usually the max dose for this medication. PharmD reviewed this extensively with him and his wife as well. He says that he'll speak with his PCP regarding his medication related to his diabetes.   Return here in 3 months or sooner for any questions/problems before then.

## 2015-06-12 NOTE — Patient Instructions (Signed)
Continue weighing daily and call for an overnight weight gain of > 2 pounds or a weekly weight gain of >5 pounds. 

## 2015-06-13 DIAGNOSIS — E119 Type 2 diabetes mellitus without complications: Secondary | ICD-10-CM

## 2015-06-16 ENCOUNTER — Encounter: Payer: Self-pay | Admitting: Internal Medicine

## 2015-06-16 ENCOUNTER — Ambulatory Visit (INDEPENDENT_AMBULATORY_CARE_PROVIDER_SITE_OTHER): Payer: Medicare Other | Admitting: Internal Medicine

## 2015-06-16 VITALS — BP 120/68 | HR 66 | Temp 98.0°F | Wt 198.0 lb

## 2015-06-16 DIAGNOSIS — B9789 Other viral agents as the cause of diseases classified elsewhere: Principal | ICD-10-CM

## 2015-06-16 DIAGNOSIS — Z794 Long term (current) use of insulin: Secondary | ICD-10-CM

## 2015-06-16 DIAGNOSIS — J449 Chronic obstructive pulmonary disease, unspecified: Secondary | ICD-10-CM | POA: Diagnosis not present

## 2015-06-16 DIAGNOSIS — R062 Wheezing: Secondary | ICD-10-CM | POA: Diagnosis not present

## 2015-06-16 DIAGNOSIS — J069 Acute upper respiratory infection, unspecified: Secondary | ICD-10-CM | POA: Diagnosis not present

## 2015-06-16 DIAGNOSIS — E1142 Type 2 diabetes mellitus with diabetic polyneuropathy: Secondary | ICD-10-CM | POA: Diagnosis not present

## 2015-06-16 NOTE — Patient Instructions (Signed)
Viral Conjunctivitis Viral conjunctivitis is an inflammation of the clear membrane that covers the white part of your eye and the inner surface of your eyelid (conjunctiva). The inflammation is caused by a viral infection. The blood vessels in the conjunctiva become inflamed, causing the eye to become red or pink, and often itchy. Viral conjunctivitis can easily be passed from one person to another (contagious). CAUSES  Viral conjunctivitis is caused by a virus. A virus is a type of contagious germ. It can be spread by touching objects that have been contaminated with the virus, such as doorknobs or towels.  SYMPTOMS  Symptoms of viral conjunctivitis may include:   Eye redness.  Tearing or watery eyes.  Itchy eyes.  Burning feeling in the eyes.  Clear drainage from the eye.  Swollen eyelids.  A gritty feeling in the eye.  Light sensitivity. DIAGNOSIS  Viral conjunctivitis may be diagnosed with a medical history and physical exam. If you have discharge from your eye, the discharge may be tested to rule out other causes of conjunctivitis.  TREATMENT  Viral conjunctivitis does not respond to medicines that kill bacteria (antibiotics). Treatment for viral conjunctivitis is directed at stopping a bacterial infection from developing in addition to the viral infection. Treatment also aims to relieve your symptoms, such as itching. This may be done with antihistamine drops or other eye medicines. HOME CARE INSTRUCTIONS  Take medicines only as directed by your health care provider.  Avoid touching or rubbing your eyes.  Apply a warm, clean washcloth to your eye for 10-20 minutes, 3-4 times per day.  If you wear contact lenses, do not wear them until the inflammation is gone and your health care provider says it is safe to wear them again. Ask your health care provider how to sterilize or replace your contact lenses before using them again. Wear glasses until you can resume wearing  contacts.  Avoid wearing eye makeup until the inflammation is gone. Throw away any old eye cosmetics that may be contaminated.  Change or wash your pillowcase every day.  Do not share towels or washcloths. This may spread the infection.  Wash your hands often with soap and water. Use paper towels to dry your hands.  Gently wipe away any drainage from your eye with a warm, wet washcloth or a cotton ball.  Be very careful to avoid touching the edge of the eyelid with the eye drop bottle or ointment tube when applying medicines to the affected eye. This will stop you from spreading the infection to the other eye or to other people. SEEK MEDICAL CARE IF:   Your symptoms do not improve with treatment.  You have increased pain.  Your vision becomes blurry.  You have a fever.  You have facial pain, redness, or swelling.  You have new symptoms.  Your symptoms get worse.   This information is not intended to replace advice given to you by your health care provider. Make sure you discuss any questions you have with your health care provider.   Document Released: 06/19/2002 Document Revised: 09/20/2005 Document Reviewed: 01/08/2014 Elsevier Interactive Patient Education 2016 [employer]vier Inc.  

## 2015-06-16 NOTE — Progress Notes (Signed)
Pre visit review using our clinic review tool, if applicable. No additional management support is needed unless otherwise documented below in the visit note. 

## 2015-06-16 NOTE — Progress Notes (Signed)
HPI  Pt presents to the clinic today with c/o runny nose, sore throat and cough. This started 2 days ago. He report his throat is itchy/scratchy. He denies difficulty swallowing. He is blowing clear mucous out of his nose. The cough is productive of white/yellow mucous. He denies shortness of breath. He denies ear pain, fever, chills or body aches. He has taken Tylenol and Vit C with some relief. He has a history of COPD and DM2, but takes his DM medications, Spiriva and Prednisone as prescribed. He has had sick contacts.  He also wants to discuss his sugar levels. His A1C was 9.5% 04/2015. He has been taking Levemir, Humalog and Januvia. Glipizide was added to his regimen but he reports when he started taking this, his sugars went up, fasting 128-190. He stopped taking the Glipizide and went back to Amaryl 4 mg BID. Since that time, his fasting sugars have ranged 90-159. He denies hypoglycemia. He would like to know if he can stay on the Amaryl and off the Glipizide.  Review of Systems      Past Medical History  Diagnosis Date  . HYPERTENSION   . HYPERLIPIDEMIA   . CAD     a. MI 01/29/1996 tx'd w/ TPA @ [place]; b. Myoview 06/2005: EF 50%, scar @ apex, mild peri-infarct ischemia  . Chronic atrial fibrillation ([place])     a. since 2006; b. on warfarin  . DM   . History of kidney stones   . Neuropathy of both feet ([place])   . Chronic diastolic CHF (congestive heart failure) ([place])     a. echo 04/2006: EF lower limits of nl, mod LVH, mild aortic root dilatation, & mild MR, biatrial enlargement; b. echo 04/2013: EF 60%, mod dilated LA, mild MR & TR, mod pulm HTN w/ RV systolic pressure 53, c. echo 04/21/14: EF 55-60%, unable to exclude WMA, severely dilated LA 6.6 cm, nl RVSP, mildly dilated aortic root  . Kidney stone     a. s/p left ureteral stenting 04/24/14  . COPD (chronic obstructive pulmonary disease) ([place])   . GERD (gastroesophageal reflux disease)   . Cancer ([place])     skin  . Arthritis   . CVA  [id number]    x2  . Falls   . Poor balance     Family History  Problem Relation Age of Onset  . Heart disease Maternal Grandmother   . Diabetes Maternal Grandmother   . Cancer Neg Hx   . Stroke Neg Hx   . Heart disease Mother     Social History   Social History  . Marital Status: Married    Spouse Name: N/A  . Number of Children: N/A  . Years of Education: N/A   Occupational History  . Not on file.   Social History Main Topics  . Smoking status: Former Smoker -- 2.00 packs/day for 40 years    Types: Cigarettes    Quit date: 05/24/1990  . Smokeless tobacco: Former Systems developer    Quit date: 05/24/1990  . Alcohol Use: No  . Drug Use: No  . Sexual Activity: Not Currently   Other Topics Concern  . Not on file   Social History Narrative    Allergies  Allergen Reactions  . Contrast Media [Iodinated Diagnostic Agents] Shortness Of Breath  . Morphine And Related Other (See Comments)    Hallucinations   . Niacin And Related Dermatitis     Constitutional: Denies headache, fatigue, fever or  abrupt weight changes.  HEENT:  Positive runny nose, sore throat. Denies eye redness, eye pain, pressure behind the eyes, facial pain, nasal congestion, ear pain, ringing in the ears, wax buildup, or bloody nose. Respiratory: Positive cough. Denies difficulty breathing or shortness of breath.  Cardiovascular: Denies chest pain, chest tightness, palpitations or swelling in the hands or feet.   No other specific complaints in a complete review of systems (except as listed in HPI above).  Objective:   BP 120/68 mmHg  Pulse 66  Temp(Src) 98 F (36.7 C) (Oral)  Wt 198 lb (89.812 kg)  SpO2 99%  Wt Readings from Last 3 Encounters:  06/16/15 198 lb (89.812 kg)  06/12/15 199 lb (90.266 kg)  04/30/15 197 lb (89.359 kg)     General: Appears his stated age, obese in NAD. HEENT: Head: normal shape and size; Eyes: sclera injected, no icterus, conjunctiva erythematous; Ears: bilateral  cerumen impaction;  Throat/Mouth: Teeth present, mucosa pink and moist, no exudate noted, no lesions or ulcerations noted.  Neck: No cervical lymphadenopathy.  Cardiovascular: Normal rate and rhythm. S1,S2 noted.  No murmur, rubs or gallops noted.  Pulmonary/Chest: Normal effort and positive vesicular breath sounds. No respiratory distress. No wheezes, rales or ronchi noted.      Assessment & Plan:   Viral Upper Respiratory Infection with cough:  Get some rest and drink plenty of water Do salt water gargles for the sore throat Flonase and Mucinex  Wheezing secondary to COPD:  Continue Prednisone and Spiriva Use your Nebulizer every 8 hours for the next 24-48 hours Let me know if symptom are worsening, we can send in abx  DM 2, uncontrolled:  Continue Levemir, Humalog, Januvia and Amaryl Continue to work on diet Will check A1C in 6 weeks  RTC as needed or if symptoms persist.

## 2015-06-17 ENCOUNTER — Other Ambulatory Visit: Payer: Self-pay | Admitting: Internal Medicine

## 2015-06-17 ENCOUNTER — Telehealth: Payer: Self-pay

## 2015-06-17 MED ORDER — AMOXICILLIN 500 MG PO CAPS: 500.0000 mg | ORAL_CAPSULE | Freq: Three times a day (TID) | ORAL | Status: DC

## 2015-06-17 NOTE — Telephone Encounter (Signed)
Amoxil sent to [facility]

## 2015-06-17 NOTE — Telephone Encounter (Signed)
[name] is aware as instructed

## 2015-06-17 NOTE — Telephone Encounter (Signed)
Pt left v/m; pt was seen 06/16/15 and today pt is worse, having chills and fever. Pt request abx to [facility] [address] Mrs Vantine request cb.

## 2015-06-23 ENCOUNTER — Telehealth: Payer: Self-pay

## 2015-06-23 ENCOUNTER — Other Ambulatory Visit: Payer: Self-pay | Admitting: Internal Medicine

## 2015-06-23 NOTE — Telephone Encounter (Signed)
Pt has been taking mucinex DM to get mucus up;when pt takes mucinex nebulizer and abx together,when pt goes to sleep, pt is constantly moving; he is using his hands like he is sewing or working on a machine and not resting well. Ms Blando wants to know if can take mucinex,abx and nebulizer together. Pt's wife wants to know if can take citalopram with mucinex DM together also. Using warm compresses on eyes and eye sight is worse since having virus in eyes and should pt be using glaucoma med while has this virus. pts eyes are not as red as when seen but still red; No fever now.Mrs. Godeaux request cb.

## 2015-06-23 NOTE — Telephone Encounter (Signed)
Pt's wife is aware as instructed---expressed understanding if Sx worsen or notice of eye selling, pain or discharge and vision changes to call

## 2015-06-23 NOTE — Telephone Encounter (Signed)
Yes he can take them together. It is likely that the albuterol is making him shaky. Make sure he is taking plain mucinex and not Mucinex with any type of phenylephrine in it. Continue warm compresses for eyes, let me know if worse

## 2015-06-25 ENCOUNTER — Other Ambulatory Visit: Payer: Self-pay | Admitting: Cardiovascular Disease

## 2015-06-25 NOTE — Telephone Encounter (Signed)
Shirley left v/m as FYI that pts eyes look good and pt is feeling a lot better.

## 2015-06-25 NOTE — Telephone Encounter (Signed)
Good, I'm glad. Thanks for the update

## 2015-07-08 ENCOUNTER — Encounter: Payer: Self-pay | Admitting: Cardiovascular Disease

## 2015-07-08 ENCOUNTER — Ambulatory Visit (INDEPENDENT_AMBULATORY_CARE_PROVIDER_SITE_OTHER): Payer: Medicare Other | Admitting: Cardiovascular Disease

## 2015-07-08 VITALS — BP 140/76 | HR 65 | Ht 66.0 in | Wt 199.0 lb

## 2015-07-08 DIAGNOSIS — I482 Chronic atrial fibrillation, unspecified: Secondary | ICD-10-CM

## 2015-07-08 DIAGNOSIS — E785 Hyperlipidemia, unspecified: Secondary | ICD-10-CM

## 2015-07-08 DIAGNOSIS — E119 Type 2 diabetes mellitus without complications: Secondary | ICD-10-CM

## 2015-07-08 DIAGNOSIS — I10 Essential (primary) hypertension: Secondary | ICD-10-CM

## 2015-07-08 DIAGNOSIS — I4891 Unspecified atrial fibrillation: Secondary | ICD-10-CM | POA: Diagnosis not present

## 2015-07-08 DIAGNOSIS — I5032 Chronic diastolic (congestive) heart failure: Secondary | ICD-10-CM

## 2015-07-08 DIAGNOSIS — R296 Repeated falls: Secondary | ICD-10-CM

## 2015-07-08 DIAGNOSIS — Z794 Long term (current) use of insulin: Secondary | ICD-10-CM

## 2015-07-08 MED ORDER — METOLAZONE 5 MG PO TABS: 5.0000 mg | ORAL_TABLET | Freq: Every day | ORAL | Status: DC | PRN

## 2015-07-08 NOTE — Assessment & Plan Note (Addendum)
Weight is slowly trending upwards High fluid intake, including soda Suggested he try to decrease his fluid intake, Also gave him metolazone to take either one half or whole pill 30 minutes prior to a.m. metolazone for weight of 200 pounds or more Recommended torsemide 40 mg twice a day for leg edema He will need basic metabolic panel next months with primary care, we did offer lab draw today but he deferred

## 2015-07-08 NOTE — Assessment & Plan Note (Signed)
Unsteady gait, recommended a regular exercise program such as water aerobics or recumbent bike

## 2015-07-08 NOTE — Progress Notes (Signed)
Patient ID: Shawn Grace., male    DOB: September 12, 1931, 82 y.o.   MRN: [id number]  HPI Comments: [name] is a pleasant 82 year old gentleman, who has a history of diabetes type 2, coronary artery disease, status post myocardial infarction treated with TPA at [facility] in January 29, 1996.   chronic atrial fibrillation since 2006, on warfarin. Walks with a cane. 5027 had a cva, uncertain if [name] was on anticoagulation at the time History off falls.  Seen by Dr. Raul Del, pulmonary. Also seen by CHF clinic. Several admissions to the hospital dating back to December 2014 with urine output function, urinary tract infections, status post stent placement by Dr. Elenor Quinones. [name] presents today for evaluation of his acute on chronic diastolic CHF Poorly controlled diabetes  In follow-up today, weight has trended between low 190 up to high 190 pounds Weight today is 3 pounds higher than his last clinic visit [name] does have compression hose on, does have leg edema bilaterally [name] took torsemide 40 mg twice a day yesterday  Hemoglobin A1c reviewed with him, 9.5.  [name] is trying to eat better, reports sugars at home 120 up to 140   EKG on today's visit showing atrial fibrillation with rate 65 bpm, old anterior MI, consider inferior MI  Other past medical history history of strokes documented on MRI. History of fall, fell backwards when walking with his walker  hospital 06/03/2014 with discharge on the 26th with urinary tract infection requiring antibiotics.   hospital admission 05/02/2014 with acute stroke. [name] was off warfarin for circumcision in early January 2016. [name] did have Lovenox bridge MRI showed multiple embolic strokes, left parietal, occipital areas. [name] was seen by neurology. [name] reports having continued vision problems. INR was 2 and [name] was continued on his warfarin. [name] is doing home physical therapy. At the time of discharge [name] was started on diltiazem. [name] does have chronic lower extremity  edema. [name] has not received the diltiazem yet and is waiting for mail-order.  Recent carotid ultrasound showing bilateral mild plaque dated 05/03/2014 Echocardiogram 05/03/2014 showing normal LV systolic function, normal right ventricular systolic pressure MRI brain showing multiple punctate foci acute infarction left parietal, left occipital, left frontal lobes likely embolic, chronic right occipital infarct  EKG on today's visit shows age fibrillation with ventricular rate 75 bpm, old anterior MI, inferior MI INR 2 days ago was 1.6. INR today 1.8  Hemoglobin A1c elevated at 9.6  hospital admission January 26 2014 with discharge October 20 with urosepsis, bladder outflow obstruction.cultures grew [facility] was called to place a Foley catheter. Initial creatinine 1.56, BUN 25.  Echocardiogram 01/25/2014 showing moderate pulmonary hypertension, normal ejection fraction Notes indicate [name] had antibiotics, diuresis through his hospital course with improvement of his renal function down to creatinine 1.26. Creatinine was checked 2 days ago and was up to 1.4 with BUN 29 [name] had a Foley catheter for 10 days which was removed  [name] fell in January 2015 after taking a nap, [name] fell into his dresser drawer. Significant trauma to his left arm [name] fell possibly in April 2015the details are unavailable. Did not hurt himself [place] again recently while in the bathroom. Lost his footing, fell backwards, broke the toilet lid, large left flank hematoma  Myoview in March 2007 showed an ejection fraction of 50% with an old scar at the apex and mild peri-infarct ischemia.   echocardiogram in January 2008 showed an ejection fraction at the lower limits of normal with moderate  LVH, mild aortic root dilatation, and mild mitral regurgitation.  There was biatrial enlargement.   echocardiogram January 2015 showing ejection fraction 60%, moderately dilated left atrium, mild MR and TR, moderate pulmonary hypertension  with right ventricular systolic pressure 53   Previous carotid Dopplers in January of 2008 showed 0-39% stenosis bilaterally. Carotid ultrasound may 2009 showing mild bilateral plaque, no significant stenoses  Allergies  Allergen Reactions  . Contrast Media [Iodinated Diagnostic Agents] Shortness Of Breath  . Morphine And Related Other (See Comments)    Hallucinations   . Niacin And Related Dermatitis    Outpatient Encounter Prescriptions as of 07/08/2015  Medication Sig  . acetaminophen (TYLENOL) 650 MG CR tablet Take 650 mg by mouth every 8 (eight) hours as needed.    Marland Kitchen albuterol (2.5 MG/3ML) 0.083% NEBU 3 mL, albuterol (5 MG/ML) 0.5% NEBU 0.5 mL Inhale 1 mg into the lungs.  . ALPHAGAN P 0.15 % ophthalmic solution 2 drops 2 (two) times daily.   Marland Kitchen ALPRAZolam (XANAX) 0.25 MG tablet Take 1 tablet (0.25 mg total) by mouth at bedtime as needed for anxiety.  Marland Kitchen amoxicillin (AMOXIL) 500 MG capsule Take 1 capsule (500 mg total) by mouth 3 (three) times daily.  Marland Kitchen atorvastatin (LIPITOR) 40 MG tablet TAKE 1 TABLET DAILY  . BD PEN NEEDLE NANO U/F 32G X 4 MM MISC USE THREE TIMES A DAY FOR INSULIN ADMINISTRATION  . citalopram (CELEXA) 10 MG tablet TAKE 1 TABLET DAILY  . ELIQUIS 5 MG TABS tablet TAKE 1 TABLET TWICE A DAY  . finasteride (PROSCAR) 5 MG tablet Take 5 mg by mouth daily.  Marland Kitchen gabapentin (NEURONTIN) 100 MG capsule TAKE 1 CAPSULE THREE TIMES A DAY  . glimepiride (AMARYL) 4 MG tablet Take 4 mg by mouth 2 (two) times daily.   . insulin lispro (HUMALOG KWIKPEN) 100 UNIT/ML KiwkPen 18 units with breakfast, 20 units with with lunch and 16 units with dinner  . Insulin Syringe-Needle U-100 30G X 1/2" 1 ML MISC 1 each by Does not apply route 3 (three) times daily.  Marland Kitchen JANUVIA 100 MG tablet TAKE 1 TABLET DAILY  . KLOR-CON M10 10 MEQ tablet TAKE 3 TABLETS DAILY  . latanoprost (XALATAN) 0.005 % ophthalmic solution 1 drop at bedtime.   Marland Kitchen LEVEMIR 100 UNIT/ML injection INJECT 28 UNITS TOTAL UNDER THE SKIN  AT BEDTIME. ADD 2 UNITS PER DAY UNTIL MORNING SUGAR IS LESS THAN 140  . metoprolol succinate (TOPROL-XL) 50 MG 24 hr tablet Take 1 tablet (50 mg total) by mouth 2 (two) times daily.  . nitroGLYCERIN (NITROSTAT) 0.4 MG SL tablet Place 0.4 mg under the tongue every 5 (five) minutes as needed.    . nystatin-triamcinolone (MYCOLOG II) cream Apply 1 application topically 2 (two) times daily.   . Omega-3 Fatty Acids (FISH OIL) 1000 MG CAPS Take 1 capsule by mouth 3 (three) times daily.   . predniSONE (DELTASONE) 5 MG tablet Take 1 tablet (5 mg total) by mouth daily with breakfast.  . PROAIR HFA 108 (90 BASE) MCG/ACT inhaler INHALE 1 TO 2 PUFFS EVERY 4 HOURS AS NEEDED  . SPIRIVA HANDIHALER 18 MCG inhalation capsule INHALE THE CONTENTS OF 1 CAPSULE DAILY  . tamsulosin (FLOMAX) 0.4 MG CAPS capsule TAKE 1 CAPSULE DAILY  . torsemide (DEMADEX) 20 MG tablet Take 40-80 mg by mouth daily.   . metolazone (ZAROXOLYN) 5 MG tablet Take 1 tablet (5 mg total) by mouth daily as needed.  . [DISCONTINUED] bimatoprost (LUMIGAN) 0.03 % ophthalmic solution Place  1 drop into both eyes at bedtime. Reported on 07/08/2015   No facility-administered encounter medications on file as of 07/08/2015.    Past Medical History  Diagnosis Date  . HYPERTENSION   . HYPERLIPIDEMIA   . CAD     a. MI 01/29/1996 tx'd w/ TPA @ [place]; b. Myoview 06/2005: EF 50%, scar @ apex, mild peri-infarct ischemia  . Chronic atrial fibrillation ([place])     a. since 2006; b. on warfarin  . DM   . History of kidney stones   . Neuropathy of both feet ([place])   . Chronic diastolic CHF (congestive heart failure) ([place])     a. echo 04/2006: EF lower limits of nl, mod LVH, mild aortic root dilatation, & mild MR, biatrial enlargement; b. echo 04/2013: EF 60%, mod dilated LA, mild MR & TR, mod pulm HTN w/ RV systolic pressure 53, c. echo 04/21/14: EF 55-60%, unable to exclude WMA, severely dilated LA 6.6 cm, nl RVSP, mildly dilated aortic root  . Kidney stone     a.  s/p left ureteral stenting 04/24/14  . COPD (chronic obstructive pulmonary disease) ([place])   . GERD (gastroesophageal reflux disease)   . Cancer ([place])     skin  . Arthritis   . CVA 3329,5188    x2  . Falls   . Poor balance     Past Surgical History  Procedure Laterality Date  . Tonsillectomy and adenoidectomy  1959  . Carotid stent insertion  1997  . Cardiac catheterization  1997    [facility]  . Coronary angioplasty  1997    s/p stent placement x 2   . Kidney surgery  05/2013    s/p stent placement   . Bladder surgery      stent placement   . Circumcision  2016  . Stents ureters Bilateral   . Ureteroscopy with holmium laser lithotripsy Left 10/09/2014    Procedure: URETEROSCOPY WITH HOLMIUM LASER LITHOTRIPSY;  Surgeon: Hollice Espy, MD;  Location: [facility] ORS;  Service: Urology;  Laterality: Left;  . Cystoscopy w/ ureteral stent removal Left 10/09/2014    Procedure: CYSTOSCOPY WITH STENT REMOVAL;  Surgeon: Hollice Espy, MD;  Location: [facility] ORS;  Service: Urology;  Laterality: Left;  . Cystoscopy with stent placement Left 10/09/2014    Procedure: CYSTOSCOPY WITH STENT PLACEMENT;  Surgeon: Hollice Espy, MD;  Location: [facility] OR[facility];  Service: Urology;  Laterality: Left;    Social History  reports that [name] quit smoking about 25 years ago. His smoking use included Cigarettes. [name] has a 80 pack-year smoking history. [name] quit smokeless tobacco use about 25 years ago. [name] reports that [name] does not drink alcohol or use illicit drugs.  Family History family history includes Diabetes in his maternal grandmother; Heart disease in his maternal grandmother and mother. There is no history of Cancer or Stroke.  Review of Systems  Constitutional: Negative.   Respiratory: Negative.   Cardiovascular: Positive for leg swelling.  Gastrointestinal: Negative.   Musculoskeletal: Positive for gait problem.  Neurological: Negative.   Hematological: Negative.   All other systems reviewed and are  negative.   BP 140/76 mmHg  Pulse 65  Ht '5\' 6"'$  (1.676 m)  Wt 199 lb (90.266 kg)  BMI 32.13 kg/m2  Physical Exam  Constitutional: [name] is oriented to person, place, and time. [name] appears well-developed and well-nourished.  HENT:  Head: Normocephalic.  Nose: Nose normal.  Mouth/Throat: Oropharynx is clear and moist.  Eyes: Conjunctivae are normal. Pupils are equal, round,  and reactive to light.  Neck: Normal range of motion. Neck supple. No JVD present.  Cardiovascular: Normal rate, regular rhythm, S1 normal, S2 normal, normal heart sounds and intact distal pulses.  Exam reveals no gallop and no friction rub.   No murmur heard. 1+ pitting edema to below the knees  Pulmonary/Chest: Effort normal and breath sounds normal. No respiratory distress. [name] has no wheezes. [name] has no rales. [name] exhibits no tenderness.  Abdominal: Soft. Bowel sounds are normal. [name] exhibits no distension. There is no tenderness.  Musculoskeletal: Normal range of motion. [name] exhibits no edema or tenderness.  Lymphadenopathy:    [name] has no cervical adenopathy.  Neurological: [name] is alert and oriented to person, place, and time. Coordination normal.  Skin: Skin is warm and dry. No rash noted. No erythema.  Psychiatric: [name] has a normal mood and affect. His behavior is normal. Judgment and thought content normal.      Assessment and Plan   Nursing note and vitals reviewed.

## 2015-07-08 NOTE — Assessment & Plan Note (Signed)
Encouraged him to continue on his Lipitor Cholesterol is at goal on the current lipid regimen.

## 2015-07-08 NOTE — Assessment & Plan Note (Signed)
Long discussion concerning his diet Suggested he decrease his soda intake, watch his carbohydrates Try for slow weight loss with increased activity   Total encounter time more than 25 minutes  Greater than 50% was spent in counseling and coordination of care with the patient

## 2015-07-08 NOTE — Patient Instructions (Addendum)
You are doing well.  For weight of 200 pounds or more, Take 1/2 up to a whole metolazone pill (2.5 up to 5 mg), followed by the normal torsemide pills 30 min later This is for emergency only   Please call us if you have new issues that need to be addressed before your next appt.  Your physician wants you to follow-up in: 6 months.  You will receive a reminder letter in the mail two months in advance. If you don't receive a letter, please call our office to schedule the follow-up appointment.

## 2015-07-08 NOTE — Assessment & Plan Note (Signed)
Tolerating anticoagulation Heart rate well controlled on metoprolol No medication changes made

## 2015-07-08 NOTE — Assessment & Plan Note (Signed)
Blood pressure is well controlled on today's visit. No changes made to the medications. 

## 2015-07-10 ENCOUNTER — Other Ambulatory Visit: Payer: Self-pay | Admitting: Internal Medicine

## 2015-07-15 ENCOUNTER — Encounter: Payer: Self-pay | Admitting: Internal Medicine

## 2015-07-15 ENCOUNTER — Ambulatory Visit (INDEPENDENT_AMBULATORY_CARE_PROVIDER_SITE_OTHER): Payer: Medicare Other | Admitting: Internal Medicine

## 2015-07-15 VITALS — BP 118/74 | HR 52 | Temp 98.1°F | Wt 195.5 lb

## 2015-07-15 DIAGNOSIS — J411 Mucopurulent chronic bronchitis: Secondary | ICD-10-CM | POA: Diagnosis not present

## 2015-07-15 DIAGNOSIS — E119 Type 2 diabetes mellitus without complications: Secondary | ICD-10-CM | POA: Diagnosis not present

## 2015-07-15 DIAGNOSIS — I5032 Chronic diastolic (congestive) heart failure: Secondary | ICD-10-CM

## 2015-07-15 DIAGNOSIS — R05 Cough: Secondary | ICD-10-CM | POA: Diagnosis not present

## 2015-07-15 DIAGNOSIS — Z794 Long term (current) use of insulin: Secondary | ICD-10-CM

## 2015-07-15 DIAGNOSIS — R059 Cough, unspecified: Secondary | ICD-10-CM

## 2015-07-15 LAB — BASIC METABOLIC PANEL
BUN: 29 mg/dL — ABNORMAL HIGH (ref 6–23)
CHLORIDE: 99 meq/L (ref 96–112)
CO2: 31 meq/L (ref 19–32)
CREATININE: 1.57 mg/dL — AB (ref 0.40–1.50)
Calcium: 9.5 mg/dL (ref 8.4–10.5)
GFR: 45.23 mL/min — ABNORMAL LOW (ref >60.00)
Glucose, Bld: 162 mg/dL — ABNORMAL HIGH (ref 70–99)
POTASSIUM: 3.4 meq/L — AB (ref 3.5–5.1)
SODIUM: 139 meq/L (ref 135–145)

## 2015-07-15 LAB — HEMOGLOBIN A1C: HEMOGLOBIN A1C: 7.4 % — AB (ref 4.6–6.5)

## 2015-07-15 NOTE — Patient Instructions (Signed)

## 2015-07-15 NOTE — Progress Notes (Signed)
Subjective:    Patient ID: [name]., male    DOB: [birth year], 82 y.o.   MRN: [id number]  HPI  Pt presents to the clinic today with c/o cough. This started about 7 weeks. He was prescribed Amoxil 03/07/17and cough improved. Since finishing the abx course, he reports cough has worsened. The cough is non productive. He is short of breath with exertion. He denies fever, sore throat, rhinorrhea or congestion. He has tried Mucinex, Flonase, and Spiriva with some relief. He has a h/o COPD.  In addition, he is concerned about his recent fasting glucose levels in the low 100's. He feels like this is too low. He has felt a little dizzy. He is taking Amaryl, Glucotrol, Januvia, Levemir and Lispro. His last A1C was 9.5%.  He also reports that he was recently started on a new medication by his cardiologist and he needs his potassium level.  Review of Systems  Past Medical History  Diagnosis Date  . HYPERTENSION   . HYPERLIPIDEMIA   . CAD     a. MI 01/29/1996 tx'd w/ TPA @ [facility]; b. Myoview 06/2005: EF 50%, scar @ apex, mild peri-infarct ischemia  . Chronic atrial fibrillation ([place])     a. since 2006; b. on warfarin  . DM   . History of kidney stones   . Neuropathy of both feet ([place])   . Chronic diastolic CHF (congestive heart failure) ([place])     a. echo 04/2006: EF lower limits of nl, mod LVH, mild aortic root dilatation, & mild MR, biatrial enlargement; b. echo 04/2013: EF 60%, mod dilated LA, mild MR & TR, mod pulm HTN w/ RV systolic pressure 53, c. echo 04/21/14: EF 55-60%, unable to exclude WMA, severely dilated LA 6.6 cm, nl RVSP, mildly dilated aortic root  . Kidney stone     a. s/p left ureteral stenting 04/24/14  . COPD (chronic obstructive pulmonary disease) ([place])   . GERD (gastroesophageal reflux disease)   . Cancer ([place])     skin  . Arthritis   . CVA [id number],[id number]    x2  . Falls   . Poor balance     Current Outpatient Prescriptions  Medication Sig Dispense Refill  .  acetaminophen (TYLENOL) 650 MG CR tablet Take 650 mg by mouth every 8 (eight) hours as needed.      Marland Kitchen albuterol (2.5 MG/3ML) 0.083% NEBU 3 mL, albuterol (5 MG/ML) 0.5% NEBU 0.5 mL Inhale 1 mg into the lungs.    . ALPHAGAN P 0.15 % ophthalmic solution 2 drops 2 (two) times daily.     Marland Kitchen ALPRAZolam (XANAX) 0.25 MG tablet Take 1 tablet (0.25 mg total) by mouth at bedtime as needed for anxiety. 90 tablet 0  . amoxicillin (AMOXIL) 500 MG capsule Take 1 capsule (500 mg total) by mouth 3 (three) times daily. 30 capsule 0  . atorvastatin (LIPITOR) 40 MG tablet TAKE 1 TABLET DAILY 90 tablet 3  . BD PEN NEEDLE NANO U/F 32G X 4 MM MISC USE THREE TIMES A DAY FOR INSULIN ADMINISTRATION 270 each 1  . citalopram (CELEXA) 10 MG tablet TAKE 1 TABLET DAILY 90 tablet 0  . ELIQUIS 5 MG TABS tablet TAKE 1 TABLET TWICE A DAY 180 tablet 3  . finasteride (PROSCAR) 5 MG tablet Take 5 mg by mouth daily.    Marland Kitchen gabapentin (NEURONTIN) 100 MG capsule TAKE 1 CAPSULE THREE TIMES A DAY 270 capsule 1  . glimepiride (AMARYL) 4 MG tablet Take 4 mg  by mouth 2 (two) times daily.     Marland Kitchen glipiZIDE (GLUCOTROL) 10 MG tablet TAKE 1 TABLET TWICE A DAY BEFORE MEALS 180 tablet 1  . insulin lispro (HUMALOG KWIKPEN) 100 UNIT/ML KiwkPen 18 units with breakfast, 20 units with with lunch and 16 units with dinner 60 mL 1  . Insulin Syringe-Needle U-100 30G X 1/2" 1 ML MISC 1 each by Does not apply route 3 (three) times daily. 200 each 5  . JANUVIA 100 MG tablet TAKE 1 TABLET DAILY 90 tablet 0  . KLOR-CON M10 10 MEQ tablet TAKE 3 TABLETS DAILY 270 tablet 3  . latanoprost (XALATAN) 0.005 % ophthalmic solution 1 drop at bedtime.     Marland Kitchen LEVEMIR 100 UNIT/ML injection INJECT 28 UNITS TOTAL UNDER THE SKIN AT BEDTIME. ADD 2 UNITS PER DAY UNTIL MORNING SUGAR IS LESS THAN 140 3 vial 5  . metolazone (ZAROXOLYN) 5 MG tablet Take 1 tablet (5 mg total) by mouth daily as needed. 90 tablet 1  . metoprolol succinate (TOPROL-XL) 50 MG 24 hr tablet Take 1 tablet (50 mg  total) by mouth 2 (two) times daily. 180 tablet 1  . nitroGLYCERIN (NITROSTAT) 0.4 MG SL tablet Place 0.4 mg under the tongue every 5 (five) minutes as needed.      . nystatin-triamcinolone (MYCOLOG II) cream Apply 1 application topically 2 (two) times daily.     . Omega-3 Fatty Acids (FISH OIL) 1000 MG CAPS Take 1 capsule by mouth 3 (three) times daily.     . predniSONE (DELTASONE) 5 MG tablet Take 1 tablet (5 mg total) by mouth daily with breakfast. 90 tablet 3  . PROAIR HFA 108 (90 BASE) MCG/ACT inhaler INHALE 1 TO 2 PUFFS EVERY 4 HOURS AS NEEDED 25.5 g 4  . SPIRIVA HANDIHALER 18 MCG inhalation capsule INHALE THE CONTENTS OF 1 CAPSULE DAILY 90 capsule 3  . tamsulosin (FLOMAX) 0.4 MG CAPS capsule TAKE 1 CAPSULE DAILY 90 capsule 1  . torsemide (DEMADEX) 20 MG tablet Take 40-80 mg by mouth daily.      No current facility-administered medications for this visit.    Allergies  Allergen Reactions  . Contrast Media [Iodinated Diagnostic Agents] Shortness Of Breath  . Morphine And Related Other (See Comments)    Hallucinations   . Niacin And Related Dermatitis    Family History  Problem Relation Age of Onset  . Heart disease Maternal Grandmother   . Diabetes Maternal Grandmother   . Cancer Neg Hx   . Stroke Neg Hx   . Heart disease Mother     Social History   Social History  . Marital Status: Married    Spouse Name: N/A  . Number of Children: N/A  . Years of Education: N/A   Occupational History  . Not on file.   Social History Main Topics  . Smoking status: Former Smoker -- 2.00 packs/day for 40 years    Types: Cigarettes    Quit date: 05/24/1990  . Smokeless tobacco: Former Systems developer    Quit date: 05/24/1990  . Alcohol Use: No  . Drug Use: No  . Sexual Activity: Not Currently   Other Topics Concern  . Not on file   Social History Narrative     Constitutional: Denies fever, malaise, fatigue, headache or abrupt weight changes.  HEENT: Denies eye pain, eye redness,  ear pain, ringing in the ears, wax buildup, runny nose, nasal congestion, bloody nose, or sore throat. Respiratory: Productive cough with green/white sputum. Denies difficulty breathing,  shortness of breath.    Cardiovascular: Denies chest pain, chest tightness, palpitations or swelling in the hands or feet.   No other specific complaints in a complete review of systems (except as listed in HPI above).     Objective:   Physical Exam BP 118/74 mmHg  Pulse 52  Temp(Src) 98.1 F (36.7 C) (Oral)  Wt 195 lb 8 oz (88.678 kg)  SpO2 97% Wt Readings from Last 3 Encounters:  07/15/15 195 lb 8 oz (88.678 kg)  07/08/15 199 lb (90.266 kg)  06/16/15 198 lb (89.812 kg)    General: Appears his stated age, chronically ill appearing, in NAD. HEENT: Head: normal shape and size; Nose: mucosa pink and moist, septum midline; Throat/Mouth: Teeth present, mucosa pink and moist, no exudate, lesions or ulcerations noted.  Neck:  Neck supple, trachea midline. No masses or lumps  Cardiovascular: Normal rate and rhythm. S1,S2 noted.  No murmur, rubs or gallops noted. Pulmonary/Chest: Breath sounds decreased throughout. No respiratory distress. No wheezes, rales or ronchi noted.   BMET    Component Value Date/Time   NA 143 02/21/2015 1108   NA 144 10/09/2014 2249   NA 137 08/05/2014 1839   K 4.1 02/21/2015 1108   K 3.6 08/05/2014 1839   CL 99 02/21/2015 1108   CL 106 08/05/2014 1839   CO2 25 02/21/2015 1108   CO2 23 08/05/2014 1839   GLUCOSE 269* 02/21/2015 1108   GLUCOSE 122* 10/09/2014 2249   GLUCOSE 235* 08/05/2014 1839   BUN 26 02/21/2015 1108   BUN 22* 10/09/2014 2249   BUN 19 08/05/2014 1839   CREATININE 1.55* 02/21/2015 1108   CREATININE 1.20 08/05/2014 1839   CALCIUM 9.3 02/21/2015 1108   CALCIUM 8.6* 08/05/2014 1839   GFRNONAA 41* 02/21/2015 1108   GFRNONAA 57* 08/05/2014 1839   GFRNONAA >60 05/06/2014 0406   GFRAA 48* 02/21/2015 1108   GFRAA >60 08/05/2014 1839   GFRAA >60  05/06/2014 0406    Lipid Panel     Component Value Date/Time   CHOL 107 10/04/2014 0940   TRIG 83.0 10/04/2014 0940   HDL 43.10 10/04/2014 0940   CHOLHDL 2 10/04/2014 0940   VLDL 16.6 10/04/2014 0940   LDLCALC 47 10/04/2014 0940    CBC    Component Value Date/Time   WBC 21.0* 10/09/2014 2249   WBC 17.2* 08/05/2014 1839   RBC 5.41 10/09/2014 2249   RBC 4.86 08/05/2014 1839   HGB 14.1 10/09/2014 2249   HGB 13.3 08/05/2014 1839   HCT 45.2 10/09/2014 2249   HCT 40.1 08/05/2014 1839   PLT 244 10/09/2014 2249   PLT 246 08/05/2014 1839   MCV 83.5 10/09/2014 2249   MCV 83 08/05/2014 1839   MCH 26.1 10/09/2014 2249   MCH 27.3 08/05/2014 1839   MCHC 31.2* 10/09/2014 2249   MCHC 33.1 08/05/2014 1839   RDW 16.4* 10/09/2014 2249   RDW 16.3* 08/05/2014 1839   LYMPHSABS 1.7 10/09/2014 2249   LYMPHSABS 2.3 08/05/2014 1839   MONOABS 2.0* 10/09/2014 2249   MONOABS 2.0* 08/05/2014 1839   EOSABS 0.0 10/09/2014 2249   EOSABS 0.1 08/05/2014 1839   BASOSABS 0.1 10/09/2014 2249   BASOSABS 0.1 08/05/2014 1839    Hgb A1C Lab Results  Component Value Date   HGBA1C 9.5* 04/30/2015        Assessment & Plan:   Cough:  I think this is just his chronic COPD Continue Spiriva and Prednisone Continue Mucinex as needed  DM 2, with  neuropathy:  Discussed goal for A1C Check A1C today If A1C > 8, consider cutting back the Amaryl  BMP as requested by cardiology for monitoring of Metolazone (CHF)

## 2015-07-15 NOTE — Progress Notes (Signed)
Pre visit review using our clinic review tool, if applicable. No additional management support is needed unless otherwise documented below in the visit note. 

## 2015-07-16 NOTE — Addendum Note (Signed)
Addended by: Lurlean Nanny on: 07/16/2015 01:26 PM   Modules accepted: Medications

## 2015-07-17 NOTE — Addendum Note (Signed)
Addended by: Lurlean Nanny on: 07/17/2015 03:18 PM   Modules accepted: Orders, Medications

## 2015-07-19 ENCOUNTER — Other Ambulatory Visit: Payer: Self-pay | Admitting: Internal Medicine

## 2015-08-05 ENCOUNTER — Encounter: Payer: Self-pay | Admitting: Internal Medicine

## 2015-08-05 ENCOUNTER — Ambulatory Visit (INDEPENDENT_AMBULATORY_CARE_PROVIDER_SITE_OTHER): Payer: Medicare Other | Admitting: Internal Medicine

## 2015-08-05 VITALS — BP 106/68 | HR 60 | Temp 98.1°F | Wt 195.0 lb

## 2015-08-05 DIAGNOSIS — E785 Hyperlipidemia, unspecified: Secondary | ICD-10-CM | POA: Diagnosis not present

## 2015-08-05 DIAGNOSIS — I2510 Atherosclerotic heart disease of native coronary artery without angina pectoris: Secondary | ICD-10-CM

## 2015-08-05 DIAGNOSIS — I482 Chronic atrial fibrillation, unspecified: Secondary | ICD-10-CM

## 2015-08-05 DIAGNOSIS — E119 Type 2 diabetes mellitus without complications: Secondary | ICD-10-CM

## 2015-08-05 DIAGNOSIS — N400 Enlarged prostate without lower urinary tract symptoms: Secondary | ICD-10-CM

## 2015-08-05 DIAGNOSIS — R531 Weakness: Secondary | ICD-10-CM | POA: Diagnosis not present

## 2015-08-05 DIAGNOSIS — I5032 Chronic diastolic (congestive) heart failure: Secondary | ICD-10-CM

## 2015-08-05 DIAGNOSIS — J411 Mucopurulent chronic bronchitis: Secondary | ICD-10-CM

## 2015-08-05 DIAGNOSIS — G4700 Insomnia, unspecified: Secondary | ICD-10-CM

## 2015-08-05 DIAGNOSIS — F419 Anxiety disorder, unspecified: Secondary | ICD-10-CM

## 2015-08-05 DIAGNOSIS — Z794 Long term (current) use of insulin: Secondary | ICD-10-CM

## 2015-08-05 DIAGNOSIS — I10 Essential (primary) hypertension: Secondary | ICD-10-CM | POA: Diagnosis not present

## 2015-08-05 LAB — COMPREHENSIVE METABOLIC PANEL
ALBUMIN: 3.7 g/dL (ref 3.5–5.2)
ALK PHOS: 74 U/L (ref 39–117)
ALT: 13 U/L (ref 0–53)
AST: 11 U/L (ref 0–37)
BUN: 25 mg/dL — AB (ref 6–23)
CALCIUM: 10.1 mg/dL (ref 8.4–10.5)
CO2: 33 mEq/L — ABNORMAL HIGH (ref 19–32)
CREATININE: 1.38 mg/dL (ref 0.40–1.50)
Chloride: 100 mEq/L (ref 96–112)
GFR: 52.48 mL/min — ABNORMAL LOW (ref >60.00)
Glucose, Bld: 131 mg/dL — ABNORMAL HIGH (ref 70–99)
POTASSIUM: 3.6 meq/L (ref 3.5–5.1)
SODIUM: 139 meq/L (ref 135–145)
TOTAL PROTEIN: 7.4 g/dL (ref 6.0–8.3)
Total Bilirubin: 0.6 mg/dL (ref 0.2–1.2)

## 2015-08-05 LAB — CBC
HEMATOCRIT: 43.8 % (ref 39.0–52.0)
Hemoglobin: 14.5 g/dL (ref 13.0–17.0)
MCHC: 33 g/dL (ref 30.0–36.0)
MCV: 86.1 fl (ref 78.0–100.0)
PLATELETS: 240 10*3/uL (ref 150.0–400.0)
RBC: 5.08 Mil/uL (ref 4.22–5.81)
RDW: 16.8 % — ABNORMAL HIGH (ref 11.5–15.5)
WBC: 17.3 10*3/uL — AB (ref 4.0–10.5)

## 2015-08-05 LAB — LIPID PANEL
CHOLESTEROL: 120 mg/dL (ref 0–200)
HDL: 32.1 mg/dL — ABNORMAL LOW (ref >39.00)
NonHDL: 88.27
Total CHOL/HDL Ratio: 4
Triglycerides: 208 mg/dL — ABNORMAL HIGH (ref 0.0–149.0)
VLDL: 41.6 mg/dL — ABNORMAL HIGH (ref 0.0–40.0)

## 2015-08-05 LAB — LDL CHOLESTEROL, DIRECT: Direct LDL: 61 mg/dL

## 2015-08-05 NOTE — Patient Instructions (Addendum)

## 2015-08-05 NOTE — Assessment & Plan Note (Signed)
Refill xanax Continue taking 1/2 pill qhs prn

## 2015-08-05 NOTE — Assessment & Plan Note (Addendum)
Last A1c 07/2012 7.4% Continue Humalog, levemir, neurotin, glipizide, amaryl and [place] F/u with opthalmology Check CMP, CBC today Discussed ideal fasting BGL is between 80 and 100, and possible strange feelings when BGL is lower than what his body is used to  F/u in 3 months

## 2015-08-05 NOTE — Assessment & Plan Note (Addendum)
Controlled Not on any medicines specific for HTN Check CMP

## 2015-08-05 NOTE — Assessment & Plan Note (Addendum)
Check lipids, CBC Controlled on lipitor and fish oil.  Takes nitrostat prn, but hasn't used it in a long time Followed by cardiology

## 2015-08-05 NOTE — Assessment & Plan Note (Signed)
Controlled Followed by Cardiology

## 2015-08-05 NOTE — Assessment & Plan Note (Signed)
Voids well on flomax and proscar F/u with urology prn

## 2015-08-05 NOTE — Progress Notes (Signed)
Subjective:    Patient ID: [name]., male    DOB: Oct 30, 1931, 82 y.o.   MRN: [id number]  HPI  Pt presents to the clinic today to follow up chronic conditions.  CHF: He has been breathing okay. He did see cardiology last month- note reviewed. He is currently taking Torsemide, Metolazone, Potassium, and Toprol. His kidney function has gotten slightly worse so they are not able to go up on his Torsemide at this time. He has noted improvement in his lower extremity edema. Echo from 04/2014 reviewed, EF 54%. He follows with Dr. Rockey Situ.  Anxiety: Well controlled on Celexa. He takes Xanax 1/2 pill qhs to help him sleep  Afib: He is not symptomatic. He is rate controlled on Toprol. Eliquis for anticoagulation. ECG from 07/2015. He follows with Dr. Rockey Situ.  BPH: Voiding fine on Flomax and Proscar. No recent UTI's. He last saw Dr. Erlene Quan 10/2014.   CAD s/p MI: He denies angina. LDL 47 (09/2014) on Lipitor and Fish Oil. He has Nitrostat to take if needed but reports he has not used it in a long time. He does follow with Dr. Rockey Situ.  COPD: He denies shortness of breath. He is on Prednisone daily. He takes Symbicort and Spiriva daily. He does have an Albuterol inhaler if he needs it (several weeks since last used it with a URI). He does not see a pulmonologist.  HTN: BP well controlled. He in not on any specific medications for this.  HLD: LDL 47 on Lipitor and Fish Oil. He tries to consume a low fat diet.  Insomnia: He reports he has been sleeping well lately, with the use of Xanax.  DM 2: His last A1C was 7.4% (07/2015). His sugars range 110-160. He takes Humalog before meals. He takes Levemir at nights. He also takes Neurontin and Januvia, Glipizide and Amaryl as prescribed. His next eye exam is scheduled for 08/2015. Pneumovax 2010. Prevnar 2015. Pt reports that when his BGL is below 100, he experiences vision change and feels "funny."  Reports recent bout of weakness. Denies  dizziness, fever, low BGL or low BP.  Pt has a h/o inner ear issues. Pt has received home PT to help with strength, balance, and coordination. Pt said this helped and would like a new referral for PT.   Review of Systems      Past Medical History  Diagnosis Date  . HYPERTENSION   . HYPERLIPIDEMIA   . CAD     a. MI 01/29/1996 tx'd w/ TPA @ [facility]; b. Myoview 06/2005: EF 50%, scar @ apex, mild peri-infarct ischemia  . Chronic atrial fibrillation ([place])     a. since 2006; b. on warfarin  . DM   . History of kidney stones   . Neuropathy of both feet ([place])   . Chronic diastolic CHF (congestive heart failure) ([place])     a. echo 04/2006: EF lower limits of nl, mod LVH, mild aortic root dilatation, & mild MR, biatrial enlargement; b. echo 04/2013: EF 60%, mod dilated LA, mild MR & TR, mod pulm HTN w/ RV systolic pressure 53, c. echo 04/21/14: EF 55-60%, unable to exclude WMA, severely dilated LA 6.6 cm, nl RVSP, mildly dilated aortic root  . Kidney stone     a. s/p left ureteral stenting 04/24/14  . COPD (chronic obstructive pulmonary disease) ([place])   . GERD (gastroesophageal reflux disease)   . Cancer ([place])     skin  . Arthritis   . CVA [phone]  x2  . Falls   . Poor balance     Current Outpatient Prescriptions  Medication Sig Dispense Refill  . acetaminophen (TYLENOL) 650 MG CR tablet Take 650 mg by mouth every 8 (eight) hours as needed.      Marland Kitchen albuterol (2.5 MG/3ML) 0.083% NEBU 3 mL, albuterol (5 MG/ML) 0.5% NEBU 0.5 mL Inhale 1 mg into the lungs.    . ALPHAGAN P 0.15 % ophthalmic solution 2 drops 2 (two) times daily.     Marland Kitchen ALPRAZolam (XANAX) 0.25 MG tablet Take 1 tablet (0.25 mg total) by mouth at bedtime as needed for anxiety. 90 tablet 0  . atorvastatin (LIPITOR) 40 MG tablet TAKE 1 TABLET DAILY 90 tablet 3  . BD PEN NEEDLE NANO U/F 32G X 4 MM MISC USE THREE TIMES A DAY FOR INSULIN ADMINISTRATION 270 each 1  . citalopram (CELEXA) 10 MG tablet TAKE 1 TABLET DAILY 90 tablet 0  .  ELIQUIS 5 MG TABS tablet TAKE 1 TABLET TWICE A DAY 180 tablet 3  . finasteride (PROSCAR) 5 MG tablet Take 5 mg by mouth daily.    Marland Kitchen gabapentin (NEURONTIN) 100 MG capsule TAKE 1 CAPSULE THREE TIMES A DAY 270 capsule 1  . glimepiride (AMARYL) 4 MG tablet Take 4 mg by mouth 2 (two) times daily.     Marland Kitchen glipiZIDE (GLUCOTROL) 10 MG tablet TAKE 1 TABLET TWICE A DAY BEFORE MEALS 180 tablet 1  . insulin lispro (HUMALOG KWIKPEN) 100 UNIT/ML KiwkPen 18 units with breakfast, 20 units with with lunch and 16 units with dinner 60 mL 1  . Insulin Syringe-Needle U-100 30G X 1/2" 1 ML MISC 1 each by Does not apply route 3 (three) times daily. 200 each 5  . JANUVIA 100 MG tablet TAKE 1 TABLET DAILY 90 tablet 0  . LEVEMIR 100 UNIT/ML injection INJECT 28 UNITS TOTAL UNDER THE SKIN AT BEDTIME. ADD 2 UNITS PER DAY UNTIL MORNING SUGAR IS LESS THAN 140 3 vial 5  . metolazone (ZAROXOLYN) 5 MG tablet Take 1 tablet (5 mg total) by mouth daily as needed. 90 tablet 1  . metoprolol succinate (TOPROL-XL) 50 MG 24 hr tablet TAKE 1 TABLET TWICE A DAY 180 tablet 1  . nitroGLYCERIN (NITROSTAT) 0.4 MG SL tablet Place 0.4 mg under the tongue every 5 (five) minutes as needed.      . nystatin-triamcinolone (MYCOLOG II) cream Apply 1 application topically 2 (two) times daily.     . Omega-3 Fatty Acids (FISH OIL) 1000 MG CAPS Take 1 capsule by mouth 3 (three) times daily.     . potassium chloride (KLOR-CON 10) 10 MEQ tablet Take 40 mEq by mouth daily.    . predniSONE (DELTASONE) 5 MG tablet Take 1 tablet (5 mg total) by mouth daily with breakfast. 90 tablet 3  . PROAIR HFA 108 (90 BASE) MCG/ACT inhaler INHALE 1 TO 2 PUFFS EVERY 4 HOURS AS NEEDED 25.5 g 4  . SPIRIVA HANDIHALER 18 MCG inhalation capsule INHALE THE CONTENTS OF 1 CAPSULE DAILY 90 capsule 3  . tamsulosin (FLOMAX) 0.4 MG CAPS capsule TAKE 1 CAPSULE DAILY 90 capsule 1  . torsemide (DEMADEX) 20 MG tablet Take 40-80 mg by mouth daily.      No current facility-administered  medications for this visit.    Allergies  Allergen Reactions  . Contrast Media [Iodinated Diagnostic Agents] Shortness Of Breath  . Morphine And Related Other (See Comments)    Hallucinations   . Niacin And Related Dermatitis  Family History  Problem Relation Age of Onset  . Heart disease Maternal Grandmother   . Diabetes Maternal Grandmother   . Cancer Neg Hx   . Stroke Neg Hx   . Heart disease Mother     Social History   Social History  . Marital Status: Married    Spouse Name: N/A  . Number of Children: N/A  . Years of Education: N/A   Occupational History  . Not on file.   Social History Main Topics  . Smoking status: Former Smoker -- 2.00 packs/day for 40 years    Types: Cigarettes    Quit date: 05/24/1990  . Smokeless tobacco: Former Systems developer    Quit date: 05/24/1990  . Alcohol Use: No  . Drug Use: No  . Sexual Activity: Not Currently   Other Topics Concern  . Not on file   Social History Narrative     Constitutional: Denies fever, malaise, fatigue, headache or abrupt weight changes.  Respiratory: Denies difficulty breathing, shortness of breath, cough or sputum production.   Cardiovascular: Pt reports swelling in his legs no worse than usual. Controlled with TED hose. Denies chest pain, chest tightness, palpitations or swelling in the hands Gastrointestinal: Denies abdominal pain, bloating, constipation, diarrhea or blood in the stool.  GU: Denies urgency, frequency, pain with urination, burning sensation, blood in urine, odor or discharge. Skin: Denies redness, rashes, lesions or ulcercations.  Neurological: Denies dizziness, difficulty with memory, difficulty with speech or problems with balance and coordination. Musculoskeletal: Reports recent weakness not associate with dizziness, BGL or BP.   Psych: Denies anxiety, depression, SI/HI.  No other specific complaints in a complete review of systems (except as listed in HPI above).  Objective:    Physical Exam   BP 106/68 mmHg  Pulse 60  Temp(Src) 98.1 F (36.7 C) (Oral)  Wt 195 lb (88.451 kg)  SpO2 98%  Wt Readings from Last 3 Encounters:  08/05/15 195 lb (88.451 kg)  07/15/15 195 lb 8 oz (88.678 kg)  07/08/15 199 lb (90.266 kg)    General: Appears his stated age, obese, chronically ill appearing, in NAD. Cardiovascular: Irregular rate and rhythm. S1,S2 noted.  No murmur, rubs or gallops noted. 1+ pitting BLE edema. No carotid bruits heard. Pulmonary/Chest: Normal effort and positive vesicular breath sounds. No respiratory distress. No wheezes, rales or ronchi noted.  Abdomen: Distended but soft and nontender. Normal bowel sounds. No distention or masses noted. Liver, spleen and kidneys non palpable. Neurological: Alert and oriented.  Psychiatric: Mood and affect normal. Behavior is normal. Judgment and thought content normal.    BMET    Component Value Date/Time   NA 139 07/15/2015 1041   NA 143 02/21/2015 1108   NA 137 08/05/2014 1839   K 3.4* 07/15/2015 1041   K 3.6 08/05/2014 1839   CL 99 07/15/2015 1041   CL 106 08/05/2014 1839   CO2 31 07/15/2015 1041   CO2 23 08/05/2014 1839   GLUCOSE 162* 07/15/2015 1041   GLUCOSE 269* 02/21/2015 1108   GLUCOSE 235* 08/05/2014 1839   BUN 29* 07/15/2015 1041   BUN 26 02/21/2015 1108   BUN 19 08/05/2014 1839   CREATININE 1.57* 07/15/2015 1041   CREATININE 1.20 08/05/2014 1839   CALCIUM 9.5 07/15/2015 1041   CALCIUM 8.6* 08/05/2014 1839   GFRNONAA 41* 02/21/2015 1108   GFRNONAA 57* 08/05/2014 1839   GFRNONAA >60 05/06/2014 0406   GFRAA 48* 02/21/2015 1108   GFRAA >60 08/05/2014 1839   GFRAA >60  05/06/2014 0406    Lipid Panel     Component Value Date/Time   CHOL 107 10/04/2014 0940   TRIG 83.0 10/04/2014 0940   HDL 43.10 10/04/2014 0940   CHOLHDL 2 10/04/2014 0940   VLDL 16.6 10/04/2014 0940   LDLCALC 47 10/04/2014 0940    CBC    Component Value Date/Time   WBC 21.0* 10/09/2014 2249   WBC 17.2*  08/05/2014 1839   RBC 5.41 10/09/2014 2249   RBC 4.86 08/05/2014 1839   HGB 14.1 10/09/2014 2249   HGB 13.3 08/05/2014 1839   HCT 45.2 10/09/2014 2249   HCT 40.1 08/05/2014 1839   PLT 244 10/09/2014 2249   PLT 246 08/05/2014 1839   MCV 83.5 10/09/2014 2249   MCV 83 08/05/2014 1839   MCH 26.1 10/09/2014 2249   MCH 27.3 08/05/2014 1839   MCHC 31.2* 10/09/2014 2249   MCHC 33.1 08/05/2014 1839   RDW 16.4* 10/09/2014 2249   RDW 16.3* 08/05/2014 1839   LYMPHSABS 1.7 10/09/2014 2249   LYMPHSABS 2.3 08/05/2014 1839   MONOABS 2.0* 10/09/2014 2249   MONOABS 2.0* 08/05/2014 1839   EOSABS 0.0 10/09/2014 2249   EOSABS 0.1 08/05/2014 1839   BASOSABS 0.1 10/09/2014 2249   BASOSABS 0.1 08/05/2014 1839    Hgb A1C Lab Results  Component Value Date   HGBA1C 7.4* 07/15/2015        Assessment & Plan:    Weakness:  Referral to PT for strengthening  RTC in 3 months for follow up chronic conditions

## 2015-08-05 NOTE — Assessment & Plan Note (Addendum)
Controlled on torsemide, metolazonepotassium and toprol. BLE 1+ pitting edema controlled with TED hose Discussed leg elevation at home Followed by cardiology

## 2015-08-05 NOTE — Assessment & Plan Note (Signed)
Continue prednisone, symbicort and spiriva daily Uses nebulizer prn (last use when he had  URI)

## 2015-08-05 NOTE — Assessment & Plan Note (Addendum)
Controlled. Asymptomatic with irregular rhythm Taking eliquis and toprol Followed by cardiology

## 2015-08-05 NOTE — Assessment & Plan Note (Addendum)
Controlled Continue celexa and xanax prn

## 2015-08-05 NOTE — Assessment & Plan Note (Signed)
Continue lipitor and fish oil Check lipids

## 2015-08-05 NOTE — Progress Notes (Signed)
Pre visit review using our clinic review tool, if applicable. No additional management support is needed unless otherwise documented below in the visit note. 

## 2015-08-08 MED ORDER — EZETIMIBE 10 MG PO TABS: 10.0000 mg | ORAL_TABLET | Freq: Every day | ORAL | Status: DC

## 2015-08-08 NOTE — Addendum Note (Signed)
Addended by: Lurlean Nanny on: 08/08/2015 10:31 AM   Modules accepted: Orders

## 2015-08-11 ENCOUNTER — Other Ambulatory Visit: Payer: Self-pay | Admitting: Internal Medicine

## 2015-09-02 ENCOUNTER — Other Ambulatory Visit: Payer: Self-pay | Admitting: Internal Medicine

## 2015-09-05 ENCOUNTER — Other Ambulatory Visit: Payer: Self-pay | Admitting: Internal Medicine

## 2015-09-09 ENCOUNTER — Encounter: Payer: Self-pay | Admitting: Pulmonary Disease

## 2015-09-09 ENCOUNTER — Ambulatory Visit (INDEPENDENT_AMBULATORY_CARE_PROVIDER_SITE_OTHER): Payer: Medicare Other | Admitting: Pulmonary Disease

## 2015-09-09 VITALS — BP 132/76 | HR 70 | Ht 66.0 in | Wt 197.0 lb

## 2015-09-09 DIAGNOSIS — R0600 Dyspnea, unspecified: Secondary | ICD-10-CM

## 2015-09-09 DIAGNOSIS — E669 Obesity, unspecified: Secondary | ICD-10-CM

## 2015-09-09 DIAGNOSIS — J449 Chronic obstructive pulmonary disease, unspecified: Secondary | ICD-10-CM | POA: Diagnosis not present

## 2015-09-09 DIAGNOSIS — Z7952 Long term (current) use of systemic steroids: Secondary | ICD-10-CM | POA: Diagnosis not present

## 2015-09-09 NOTE — Progress Notes (Signed)
PULMONARY OFFICE FOLLOW UP NOTE  Date of initial consultation: 03/10/15 Reason for consultation: COPD Initial HPI: 66 M diagnosed with COPD approx 2004. Previously followed by Dr Raul Del. Wishes to establish care here. At his baseline, he is dyspneic with minimal exertion limited to ambulating no more than 50 meters and less than one flight of stairs. He has little day to day variation. He produces modest amount of white mucus on most days. He has never had hemoptysis. He has been maintained on Symbicort and Spiriva with PRN albuterol which he uses 1-2 times per day on average. He is also on prednisone 5 mg daily which he believes has been very beneficial. His records from Dr Gust Brooms office also indicate a diagnosis of pulmonary fibrosis but his CXR does not seem to demonstrate this and a recent CT abd/pelvis (06/03/14) reveals no fibrosis in the visualized portion of the lung bases. That CT does demonstrate a nodule in the LLL which has not been re-imaged. He also has OSA diagnosed several years ago for which he is on CPAP. He tolerates this well. Lastly, he has chronic AF and is followed by cardiology for this. Initial IMP/PLAN: COPD, Obesity, Restrictive physiology - due to obesity, Severe dyspnea - multifactorial including COPD, CAF, obesity, deconditioning, Lung nodule on CTAP 06/03/14, OSA - well controlled on CPAP, DM2 - hyperglycemia on most recent BMET. PLAN: Cont Symbicort and Spiriva as controller regimen, Cont prednisone @ 5 mg daily for now, Cont PRN albuterol, CT chest to re-eval pulmonary nodule in LLL,   CT chest 04/18/15: Interstitial lung disease characterized by symmetric upper lobe predominant extensive fine nodularity, interlobular septal thickening and ground-glass attenuation. Findings have mildly to moderately progressed since 02/01/2006 chest CT. Stable dominant 8 mm left lower lobe pulmonary nodule, for which 10 month stability has been demonstrated. Stable mild cardiomegaly. Left  main and 3 vessel coronary atherosclerosis  ROV 04/24/15: No change overall. Trial off Symbicort as it has not been clearly beneficial  ROV 09/09/15: Has lost 6 pounds with improvmeent in dyspnea and glycemic control. No worsening off Symbicort. Changed prednisone to 5 mg qod  SUBJ: Has lost 6 pounds and is walking regularly with improvement in dyspnea and in glycemic control. Has stopped Symbicort without any worsening of respiratory symptoms. Rarely using albuterol MDI. Denies CP, fever, purulent sputum, hemoptysis, LE edema and calf tenderness   OBJ:  Filed Vitals:   09/09/15 1115  BP: 132/76  Pulse: 70  Height: '5\' 6"'$  (1.676 m)  Weight: 197 lb (89.359 kg)  SpO2: 92%    EXAM:  Gen: WDWN in NAD HEENT: All WNL Lungs: Diminished BS, no wheezes Cardiovascular: IRIR, no M Abdomen: Obese, soft, NT +BS Ext: mild pitting pretibial edema  DATA: CT CHEST 04/18/15: Interstitial prominence with upper lobe predominance. Stable 8 mm LLL nodule   IMPRESSION:   Chronic dyspnea - improving with lifestyle changes as noted above Borderline hypoxemia  Former smoker Mild interstitial lung disease, NOS Restrictive pulmonary physiology due to ILD and obesity LLL Lung nodule - stable over approx one year. No further F/U planned OSA - well controlled on CPAP  PLAN:  Cont Spiriva as controller regimen Cont PRN albuterol Change prednisoneto 5 mg qod Continue excellent efforts @ weight loss and reconditioning ROV 6 weks to assess how he is doing with reduced dose of prednisone. Consider discontinuation @ that time  Merton Border, MD PCCM service Mobile [phone] Pager [phone]

## 2015-09-18 ENCOUNTER — Telehealth: Payer: Self-pay | Admitting: Urology

## 2015-09-18 ENCOUNTER — Ambulatory Visit: Payer: Medicare Other | Attending: Family | Admitting: Family

## 2015-09-18 ENCOUNTER — Encounter: Payer: Self-pay | Admitting: Family

## 2015-09-18 VITALS — BP 127/55 | HR 59 | Resp 18 | Ht 66.0 in | Wt 198.0 lb

## 2015-09-18 DIAGNOSIS — R002 Palpitations: Secondary | ICD-10-CM | POA: Insufficient documentation

## 2015-09-18 DIAGNOSIS — K5900 Constipation, unspecified: Secondary | ICD-10-CM | POA: Diagnosis not present

## 2015-09-18 DIAGNOSIS — Z8673 Personal history of transient ischemic attack (TIA), and cerebral infarction without residual deficits: Secondary | ICD-10-CM | POA: Insufficient documentation

## 2015-09-18 DIAGNOSIS — I482 Chronic atrial fibrillation, unspecified: Secondary | ICD-10-CM

## 2015-09-18 DIAGNOSIS — Z809 Family history of malignant neoplasm, unspecified: Secondary | ICD-10-CM | POA: Diagnosis not present

## 2015-09-18 DIAGNOSIS — Z8249 Family history of ischemic heart disease and other diseases of the circulatory system: Secondary | ICD-10-CM | POA: Insufficient documentation

## 2015-09-18 DIAGNOSIS — M7989 Other specified soft tissue disorders: Secondary | ICD-10-CM | POA: Insufficient documentation

## 2015-09-18 DIAGNOSIS — Z87442 Personal history of urinary calculi: Secondary | ICD-10-CM | POA: Diagnosis not present

## 2015-09-18 DIAGNOSIS — Z7901 Long term (current) use of anticoagulants: Secondary | ICD-10-CM | POA: Insufficient documentation

## 2015-09-18 DIAGNOSIS — I10 Essential (primary) hypertension: Secondary | ICD-10-CM

## 2015-09-18 DIAGNOSIS — Z885 Allergy status to narcotic agent status: Secondary | ICD-10-CM | POA: Insufficient documentation

## 2015-09-18 DIAGNOSIS — N200 Calculus of kidney: Secondary | ICD-10-CM

## 2015-09-18 DIAGNOSIS — I272 Other secondary pulmonary hypertension: Secondary | ICD-10-CM | POA: Diagnosis not present

## 2015-09-18 DIAGNOSIS — Z833 Family history of diabetes mellitus: Secondary | ICD-10-CM | POA: Insufficient documentation

## 2015-09-18 DIAGNOSIS — Z87891 Personal history of nicotine dependence: Secondary | ICD-10-CM | POA: Insufficient documentation

## 2015-09-18 DIAGNOSIS — Z794 Long term (current) use of insulin: Secondary | ICD-10-CM | POA: Insufficient documentation

## 2015-09-18 DIAGNOSIS — Z888 Allergy status to other drugs, medicaments and biological substances status: Secondary | ICD-10-CM | POA: Insufficient documentation

## 2015-09-18 DIAGNOSIS — J449 Chronic obstructive pulmonary disease, unspecified: Secondary | ICD-10-CM | POA: Insufficient documentation

## 2015-09-18 DIAGNOSIS — Z955 Presence of coronary angioplasty implant and graft: Secondary | ICD-10-CM | POA: Diagnosis not present

## 2015-09-18 DIAGNOSIS — M1990 Unspecified osteoarthritis, unspecified site: Secondary | ICD-10-CM | POA: Diagnosis not present

## 2015-09-18 DIAGNOSIS — E785 Hyperlipidemia, unspecified: Secondary | ICD-10-CM | POA: Insufficient documentation

## 2015-09-18 DIAGNOSIS — E119 Type 2 diabetes mellitus without complications: Secondary | ICD-10-CM

## 2015-09-18 DIAGNOSIS — I252 Old myocardial infarction: Secondary | ICD-10-CM | POA: Diagnosis not present

## 2015-09-18 DIAGNOSIS — I2510 Atherosclerotic heart disease of native coronary artery without angina pectoris: Secondary | ICD-10-CM | POA: Insufficient documentation

## 2015-09-18 DIAGNOSIS — I5032 Chronic diastolic (congestive) heart failure: Secondary | ICD-10-CM | POA: Insufficient documentation

## 2015-09-18 DIAGNOSIS — Z823 Family history of stroke: Secondary | ICD-10-CM | POA: Insufficient documentation

## 2015-09-18 DIAGNOSIS — K219 Gastro-esophageal reflux disease without esophagitis: Secondary | ICD-10-CM | POA: Insufficient documentation

## 2015-09-18 DIAGNOSIS — R0602 Shortness of breath: Secondary | ICD-10-CM | POA: Insufficient documentation

## 2015-09-18 DIAGNOSIS — E1140 Type 2 diabetes mellitus with diabetic neuropathy, unspecified: Secondary | ICD-10-CM | POA: Insufficient documentation

## 2015-09-18 DIAGNOSIS — Z91041 Radiographic dye allergy status: Secondary | ICD-10-CM | POA: Diagnosis not present

## 2015-09-18 DIAGNOSIS — I110 Hypertensive heart disease with heart failure: Secondary | ICD-10-CM | POA: Diagnosis not present

## 2015-09-18 NOTE — Progress Notes (Signed)
Subjective:    Patient ID: [name]., male    DOB: 1932-03-15, 82 y.o.   MRN: [id number]  Congestive Heart Failure Presents for follow-up visit. The disease course has been stable. Associated symptoms include edema, fatigue, palpitations and shortness of breath. Pertinent negatives include no abdominal pain, chest pain, chest pressure or orthopnea. The symptoms have been stable. Past treatments include salt and fluid restriction and beta blockers. The treatment provided moderate relief. Compliance with prior treatments has been good. His past medical history is significant for arrhythmia, CAD, chronic lung disease, CVA, DM and HTN. He has one 1st degree relative and one 2nd degree relative with heart disease.  Hypertension This is a chronic problem. The current episode started more than 1 year ago. The problem is unchanged. The problem is controlled. Associated symptoms include palpitations, peripheral edema and shortness of breath. Pertinent negatives include no chest pain, headaches or neck pain. Agents associated with hypertension include steroids. Risk factors for coronary artery disease include diabetes mellitus, dyslipidemia, family history, male gender, obesity and smoking/tobacco exposure. Past treatments include beta blockers, diuretics and lifestyle changes. The current treatment provides significant improvement. Compliance problems include exercise.  Hypertensive end-organ damage includes CAD/MI, CVA and heart failure.    Past Medical History  Diagnosis Date  . HYPERTENSION   . HYPERLIPIDEMIA   . CAD     a. MI 01/29/1996 tx'd w/ TPA @ [facility]; b. Myoview 06/2005: EF 50%, scar @ apex, mild peri-infarct ischemia  . Chronic atrial fibrillation ([place])     a. since 2006; b. on warfarin  . DM   . History of kidney stones   . Neuropathy of both feet ([place])   . Chronic diastolic CHF (congestive heart failure) ([place])     a. echo 04/2006: EF lower limits of nl, mod LVH, mild aortic root  dilatation, & mild MR, biatrial enlargement; b. echo 04/2013: EF 60%, mod dilated LA, mild MR & TR, mod pulm HTN w/ RV systolic pressure 53, c. echo 04/21/14: EF 55-60%, unable to exclude WMA, severely dilated LA 6.6 cm, nl RVSP, mildly dilated aortic root  . Kidney stone     a. s/p left ureteral stenting 04/24/14  . COPD (chronic obstructive pulmonary disease) ([place])   . GERD (gastroesophageal reflux disease)   . Cancer ([place])     skin  . Arthritis   . CVA [id number]    x2  . Falls   . Poor balance     Past Surgical History  Procedure Laterality Date  . Tonsillectomy and adenoidectomy  1959  . Carotid stent insertion  1997  . Cardiac catheterization  1997    [facility]  . Coronary angioplasty  1997    s/p stent placement x 2   . Kidney surgery  05/2013    s/p stent placement   . Bladder surgery      stent placement   . Circumcision  2016  . Stents ureters Bilateral   . Ureteroscopy with holmium laser lithotripsy Left 10/09/2014    Procedure: URETEROSCOPY WITH HOLMIUM LASER LITHOTRIPSY;  Surgeon: Hollice Espy, MD;  Location: [facility] ORS;  Service: Urology;  Laterality: Left;  . Cystoscopy w/ ureteral stent removal Left 10/09/2014    Procedure: CYSTOSCOPY WITH STENT REMOVAL;  Surgeon: Hollice Espy, MD;  Location: [facility] ORS;  Service: Urology;  Laterality: Left;  . Cystoscopy with stent placement Left 10/09/2014    Procedure: CYSTOSCOPY WITH STENT PLACEMENT;  Surgeon: Hollice Espy, MD;  Location: [facility] OR[facility];  Service: Urology;  Laterality: Left;    Family History  Problem Relation Age of Onset  . Heart disease Maternal Grandmother   . Diabetes Maternal Grandmother   . Cancer Neg Hx   . Stroke Neg Hx   . Heart disease Mother     Social History  Substance Use Topics  . Smoking status: Former Smoker -- 2.00 packs/day for 40 years    Types: Cigarettes    Quit date: 05/24/1990  . Smokeless tobacco: Former Systems developer    Quit date: 05/24/1990  . Alcohol Use: No    Allergies  Allergen  Reactions  . Contrast Media [Iodinated Diagnostic Agents] Shortness Of Breath  . Morphine And Related Other (See Comments)    Hallucinations   . Niacin And Related Dermatitis    Prior to Admission medications   Medication Sig Start Date End Date Taking? Authorizing Provider  acetaminophen (TYLENOL) 650 MG CR tablet Take 650 mg by mouth every 8 (eight) hours as needed.     Yes Historical Provider, MD  albuterol (2.5 MG/3ML) 0.083% NEBU 3 mL, albuterol (5 MG/ML) 0.5% NEBU 0.5 mL Inhale 1 mg into the lungs.   Yes Historical Provider, MD  ALPRAZolam (XANAX) 0.25 MG tablet Take 1 tablet (0.25 mg total) by mouth at bedtime as needed for anxiety. 11/05/14  Yes Jearld Fenton, NP  atorvastatin (LIPITOR) 40 MG tablet TAKE 1 TABLET DAILY 10/25/14  Yes Alisa Graff, FNP  BD PEN NEEDLE NANO U/F 32G X 4 MM MISC USE THREE TIMES A DAY FOR INSULIN ADMINISTRATION 06/23/15  Yes Jearld Fenton, NP  bimatoprost (LUMIGAN) 0.01 % SOLN Place 1 drop into both eyes at bedtime.   Yes Historical Provider, MD  budesonide-formoterol (SYMBICORT) 160-4.5 MCG/ACT inhaler Inhale 2 puffs into the lungs 2 (two) times daily.   Yes Historical Provider, MD  citalopram (CELEXA) 10 MG tablet TAKE 1 TABLET DAILY 06/23/15  Yes Jearld Fenton, NP  ELIQUIS 5 MG TABS tablet TAKE 1 TABLET TWICE A DAY 06/25/15  Yes Minna Merritts, MD  ezetimibe (ZETIA) 10 MG tablet Take 1 tablet (10 mg total) by mouth daily. 08/08/15  Yes Jearld Fenton, NP  finasteride (PROSCAR) 5 MG tablet Take 5 mg by mouth daily.   Yes Historical Provider, MD  fluticasone (FLONASE) 50 MCG/ACT nasal spray Place into both nostrils daily.   Yes Historical Provider, MD  gabapentin (NEURONTIN) 100 MG capsule TAKE 1 CAPSULE THREE TIMES A DAY 09/05/15  Yes Jearld Fenton, NP  glipiZIDE (GLUCOTROL) 10 MG tablet TAKE 1 TABLET TWICE A DAY BEFORE MEALS 07/10/15  Yes Jearld Fenton, NP  insulin lispro (HUMALOG KWIKPEN) 100 UNIT/ML KiwkPen 18 units with breakfast, 20 units with with  lunch and 16 units with dinner 05/02/15  Yes Jearld Fenton, NP  Insulin Syringe-Needle U-100 30G X 1/2" 1 ML MISC 1 each by Does not apply route 3 (three) times daily. 07/19/14  Yes Jearld Fenton, NP  JANUVIA 100 MG tablet TAKE 1 TABLET DAILY 08/12/15  Yes Jearld Fenton, NP  LEVEMIR 100 UNIT/ML injection INJECT 28 UNITS UNDER THE SKIN AT BEDTIME. ADD 2 UNITS PER DAY UNTIL MORNING SUGAR IS LESS THAN 140 09/02/15  Yes Jearld Fenton, NP  metoprolol succinate (TOPROL-XL) 50 MG 24 hr tablet TAKE 1 TABLET TWICE A DAY 07/21/15  Yes Abner Greenspan, MD  nitroGLYCERIN (NITROSTAT) 0.4 MG SL tablet Place 0.4 mg under the tongue every 5 (five) minutes as needed.  Yes Historical Provider, MD  nystatin-triamcinolone (MYCOLOG II) cream Apply 1 application topically 2 (two) times daily.  03/12/14  Yes Historical Provider, MD  Omega-3 Fatty Acids (FISH OIL) 1000 MG CAPS Take 1 capsule by mouth 3 (three) times daily.    Yes Historical Provider, MD  potassium chloride (KLOR-CON 10) 10 MEQ tablet Take 40 mEq by mouth daily.   Yes Historical Provider, MD  predniSONE (DELTASONE) 5 MG tablet Take 1 tablet (5 mg total) by mouth daily with breakfast. Patient taking differently: Take 5 mg by mouth every other day.  05/05/15  Yes Wilhelmina Mcardle, MD  PROAIR HFA 108 [phone] BASE) MCG/ACT inhaler INHALE 1 TO 2 PUFFS EVERY 4 HOURS AS NEEDED 10/21/14  Yes Alisa Graff, FNP  SPIRIVA HANDIHALER 18 MCG inhalation capsule INHALE THE CONTENTS OF 1 CAPSULE DAILY 02/11/15  Yes Alisa Graff, FNP  tamsulosin (FLOMAX) 0.4 MG CAPS capsule TAKE 1 CAPSULE DAILY 04/01/15  Yes Jearld Fenton, NP  torsemide (DEMADEX) 20 MG tablet Take 60 mg by mouth daily.    Yes Historical Provider, MD     Review of Systems  Constitutional: Positive for fatigue. Negative for appetite change.  HENT: Positive for congestion. Negative for rhinorrhea and sore throat.   Eyes: Negative.   Respiratory: Positive for shortness of breath. Negative for cough and chest  tightness.   Cardiovascular: Positive for palpitations and leg swelling. Negative for chest pain.  Gastrointestinal: Positive for constipation and abdominal distention. Negative for abdominal pain.  Endocrine: Negative.   Genitourinary: Negative.   Musculoskeletal: Negative for back pain and neck pain.  Skin: Negative.   Allergic/Immunologic: Negative.   Neurological: Positive for light-headedness. Negative for dizziness and headaches.  Hematological: Negative for adenopathy. Bruises/bleeds easily.  Psychiatric/Behavioral: Negative for sleep disturbance (sleeping on 1 pillow. wearing CPAP nightly) and dysphoric mood. The patient is not nervous/anxious.        Objective:   Physical Exam  Constitutional: He is oriented to person, place, and time. He appears well-developed and well-nourished.  HENT:  Head: Normocephalic and atraumatic.  Eyes: Conjunctivae are normal. Pupils are equal, round, and reactive to light.  Neck: Normal range of motion. Neck supple.  Cardiovascular: An irregular rhythm present. Bradycardia present.   Pulmonary/Chest: Effort normal. He has no wheezes. He has no rales.  Abdominal: Soft. He exhibits no distension. There is no tenderness.  Musculoskeletal: He exhibits edema (1+ pitting edema in bilateral lower legs). He exhibits no tenderness.  Neurological: He is alert and oriented to person, place, and time.  Skin: Skin is warm and dry.  Psychiatric: He has a normal mood and affect. His behavior is normal. Thought content normal.  Nursing note and vitals reviewed.   BP 127/55 mmHg  Pulse 59  Resp 18  Ht '5\' 6"'$  (1.676 m)  Wt 198 lb (89.812 kg)  BMI 31.97 kg/m2  SpO2 96%       Assessment & Plan:  1: Chronic heart failure with preserved ejection fraction- Patient presents with fatigue and shortness of breath upon exertion. He says that he was a little short of breath upon walking into the office today but once he sat down for a few minutes, his breathing  improved quickly. Feels like he's sleeping well on 1 pillow with his CPAP. He continues to have some swelling in both of his lower legs and he wears his compression socks daily as well as trying to elevate them some during the day. He continues to weigh himself  and reports a stable weight with fluctuation between 196-198 pounds. By our scale, his weight is essentially unchanged. Reminded to call for an overnight weight gain of >2 pounds or a weekly weight gain of >5 pounds. He is not adding any salt to his food and tries to make low sodium choices.  2: HTN- Blood pressure looks good today. Continue medications at this time. 3: Chronic atrial fibrillation- He is currently rate controlled with metoprolol along with eliquis. Follows closely with cardiology in this regard. 4: Diabetes- He says that his glucose yesterday was 145 and his most recent A1C was 7.4% and previously it was 11%.  5: Constipation- He says that he's become quite constipated. He's been taking a stool softener but without results. Encouraged him to try miralax to see if that helps. If not, he's to follow up with his PCP regarding this.  Medication list that patient brought was reviewed.  Return here in 3 months or sooner for any questions/problems before then.

## 2015-09-18 NOTE — Telephone Encounter (Signed)
Patient's wife called and said that they never made his 6 month follow up appt in January because they were busy so now she wants to make it. I see were you wanted him to follow up with a RUS but I don't see an order for this. Can you place an order if you still want him to have this and I will schedule his appt with you.  Thanks   [employer]

## 2015-09-18 NOTE — Telephone Encounter (Signed)
Order placed.  Probably still a good idea for RUS.  Hollice Espy, MD

## 2015-09-18 NOTE — Patient Instructions (Signed)
Continue weighing daily and call for an overnight weight gain of > 2 pounds or a weekly weight gain of >5 pounds. 

## 2015-09-21 ENCOUNTER — Other Ambulatory Visit: Payer: Self-pay | Admitting: Internal Medicine

## 2015-09-24 ENCOUNTER — Telehealth: Payer: Self-pay

## 2015-09-24 NOTE — Telephone Encounter (Signed)
Prior Authorization sent for Eliquis with [facility].  Awaiting for approval.

## 2015-09-25 ENCOUNTER — Telehealth: Payer: Self-pay

## 2015-09-25 NOTE — Telephone Encounter (Signed)
Prior authorization for Eliquis has been approved from 08/25/2015 to 09/23/2016.

## 2015-10-16 ENCOUNTER — Ambulatory Visit
Admission: RE | Admit: 2015-10-16 | Discharge: 2015-10-16 | Disposition: A | Discharge status: Home / Self Care | Payer: Medicare Other | Source: Ambulatory Visit | Attending: Urology | Admitting: Urology

## 2015-10-16 DIAGNOSIS — N200 Calculus of kidney: Secondary | ICD-10-CM | POA: Diagnosis not present

## 2015-10-17 ENCOUNTER — Encounter: Payer: Self-pay | Admitting: Cardiovascular Disease

## 2015-10-17 ENCOUNTER — Telehealth: Payer: Self-pay | Admitting: Pulmonary Disease

## 2015-10-17 ENCOUNTER — Ambulatory Visit (INDEPENDENT_AMBULATORY_CARE_PROVIDER_SITE_OTHER): Payer: Medicare Other | Admitting: Pulmonary Disease

## 2015-10-17 ENCOUNTER — Encounter: Payer: Self-pay | Admitting: Pulmonary Disease

## 2015-10-17 VITALS — BP 126/76 | HR 73 | Ht 66.0 in | Wt 196.2 lb

## 2015-10-17 DIAGNOSIS — R0609 Other forms of dyspnea: Secondary | ICD-10-CM

## 2015-10-17 DIAGNOSIS — J449 Chronic obstructive pulmonary disease, unspecified: Secondary | ICD-10-CM

## 2015-10-17 DIAGNOSIS — E669 Obesity, unspecified: Secondary | ICD-10-CM | POA: Diagnosis not present

## 2015-10-17 MED ORDER — BUDESONIDE-FORMOTEROL FUMARATE 160-4.5 MCG/ACT IN AERO: 2.0000 | INHALATION_SPRAY | Freq: Two times a day (BID) | RESPIRATORY_TRACT | Status: DC

## 2015-10-17 NOTE — Telephone Encounter (Signed)
Pt calling asking if we can send in a 90 day for Symbicort  Please advise.  [facility] is what patient is using

## 2015-10-17 NOTE — Patient Instructions (Signed)
Continue prednisone @ 5 mg every other day Resume Symbicort inhaler - 2 puffs twice a day Follow up in 6-8 weeks @ which time we will decide whether we can taper the prednisone to off

## 2015-10-20 ENCOUNTER — Other Ambulatory Visit: Payer: Self-pay | Admitting: Internal Medicine

## 2015-10-20 ENCOUNTER — Other Ambulatory Visit: Payer: Self-pay | Admitting: *Deleted

## 2015-10-20 MED ORDER — BUDESONIDE-FORMOTEROL FUMARATE 160-4.5 MCG/ACT IN AERO: 2.0000 | INHALATION_SPRAY | Freq: Two times a day (BID) | RESPIRATORY_TRACT | Status: DC

## 2015-10-21 ENCOUNTER — Other Ambulatory Visit: Payer: Self-pay | Admitting: Internal Medicine

## 2015-10-21 ENCOUNTER — Other Ambulatory Visit: Payer: Self-pay | Admitting: Family

## 2015-10-21 NOTE — Telephone Encounter (Signed)
Reminder letter mailed for pt to schedule [facility] wellness exam

## 2015-10-22 ENCOUNTER — Encounter: Payer: Self-pay | Admitting: Urology

## 2015-10-22 ENCOUNTER — Ambulatory Visit (INDEPENDENT_AMBULATORY_CARE_PROVIDER_SITE_OTHER): Payer: Medicare Other | Admitting: Urology

## 2015-10-22 VITALS — BP 114/72 | HR 71 | Ht 66.0 in | Wt 195.0 lb

## 2015-10-22 DIAGNOSIS — N183 Chronic kidney disease, stage 3 unspecified: Secondary | ICD-10-CM

## 2015-10-22 DIAGNOSIS — N200 Calculus of kidney: Secondary | ICD-10-CM

## 2015-10-22 DIAGNOSIS — N400 Enlarged prostate without lower urinary tract symptoms: Secondary | ICD-10-CM | POA: Diagnosis not present

## 2015-10-22 NOTE — Progress Notes (Signed)
PULMONARY OFFICE FOLLOW UP NOTE  Date of initial consultation: 03/10/15 Reason for consultation: COPD Initial HPI: 87 M diagnosed with COPD approx 2004. Previously followed by Dr Raul Del. Wishes to establish care here. At his baseline, he is dyspneic with minimal exertion limited to ambulating no more than 50 meters and less than one flight of stairs. He has little day to day variation. He produces modest amount of white mucus on most days. He has never had hemoptysis. He has been maintained on Symbicort and Spiriva with PRN albuterol which he uses 1-2 times per day on average. He is also on prednisone 5 mg daily which he believes has been very beneficial. His records from Dr Gust Brooms office also indicate a diagnosis of pulmonary fibrosis but his CXR does not seem to demonstrate this and a recent CT abd/pelvis (06/03/14) reveals no fibrosis in the visualized portion of the lung bases. That CT does demonstrate a nodule in the LLL which has not been re-imaged. He also has OSA diagnosed several years ago for which he is on CPAP. He tolerates this well. Lastly, he has chronic AF and is followed by cardiology for this. Initial IMP/PLAN: COPD, Obesity, Restrictive physiology - due to obesity, Severe dyspnea - multifactorial including COPD, CAF, obesity, deconditioning, Lung nodule on CTAP 06/03/14, OSA - well controlled on CPAP, DM2 - hyperglycemia on most recent BMET. PLAN: Cont Symbicort and Spiriva as controller regimen, Cont prednisone @ 5 mg daily for now, Cont PRN albuterol, CT chest to re-eval pulmonary nodule in LLL,   CT chest 04/18/15: Interstitial lung disease characterized by symmetric upper lobe predominant extensive fine nodularity, interlobular septal thickening and ground-glass attenuation. Findings have mildly to moderately progressed since 02/01/2006 chest CT. Stable dominant 8 mm left lower lobe pulmonary nodule, for which 10 month stability has been demonstrated. Stable mild cardiomegaly. Left  main and 3 vessel coronary atherosclerosis  ROV 04/24/15: No change overall. Trial off Symbicort as it has not been clearly beneficial  ROV 09/09/15: Has lost 6 pounds with improvement in dyspnea and glycemic control. No worsening off Symbicort. Changed prednisone to 5 mg qod  ROV 10/17/15: Increased dyspnea and intermittent wheezing. Resume Symbicort. Continue prednisone @ 5 mg qod.   SUBJ: Pt voices no new complaints but his wife notes increased DOE and "wheezing" over past couple of weeks. Denies CP, fever, purulent sputum, hemoptysis, LE edema and calf tenderness.    OBJ:  Filed Vitals:   10/17/15 1133  BP: 126/76  Pulse: 73  Height: '5\' 6"'$  (1.676 m)  Weight: 196 lb 3.2 oz (88.996 kg)  SpO2: 95%    EXAM:  Gen: WDWN in NAD HEENT: All WNL Lungs: Diminished BS, no wheezes Cardiovascular: IRIR, no M Abdomen: Obese, soft, NT +BS Ext: mild pitting pretibial edema  DATA:    IMPRESSION:   Chronic dyspnea - improving with lifestyle changes as noted above Borderline hypoxemia  Former smoker Mild interstitial lung disease, NOS Restrictive pulmonary physiology due to ILD and obesity LLL Lung nodule - stable over approx one year. No further F/U planned OSA - well controlled on CPAP  PLAN:  Resume Symbicort 2 actuations BID Cont Spiriva daily Cont PRN albuterol Cont prednisone 5 mg qod Continue efforts @ weight loss and reconditioning ROV 6-8 weeks   Merton Border, MD PCCM service Mobile [phone] Pager [phone]

## 2015-10-22 NOTE — Progress Notes (Signed)
10/22/2015  [name]. July 09, 1931 [id number]  Referring provider: Jearld Fenton, NP [address] [place], [place] [address]  Chief Complaint  Patient presents with  . Follow-up    55monthwith u/s results    HPI: 82yo M with severe medical comorbidities including CHF, h/o CVA, COPD, DM who wit history of recurrent bacterial and candidial sepsis since 2015/16, urinary retention, phimosis s/p office circ 04/2014, and emergent left ureteral stent placement on 04/24/14 in the setting of sepsis for obstucting ureteral stone. He was ultimately was taken to the OR on 10/09/14 for L URS, LL, stent placement for treatment of his obstructing ureteral stone only.  He has persistent large upper tract stone burden which was not addressed due to overall poor heath at the time.    Today, he looks and feels quite well.  He has not had a urinary tract infection or pyelonephritis in over a year. He has not been admitted to the hospital over the pas year after multiple serial admissions in the previous year.  He is only using oxygen as needed.    He denies any flank pain or urinary symptoms.  He feels that he is doing well emptying his bladder.  He remains on flomax.  No recent fevers or chills.   Follow-up renal ultrasound shows no hydronephrosis bilaterally. He continues to have a large left lower pole stone burden which has likely increased in volume.      PMH: Past Medical History  Diagnosis Date  . HYPERTENSION   . HYPERLIPIDEMIA   . CAD     a. MI 01/29/1996 [facility]/ [facility] b. Myoview 06/2005: EF 50%, scar @ apex, mild peri-infarct ischemia  . Chronic atrial fibrillation ([facility]     a. since 2006; b. on warfarin  . DM   . History of kidney stones   . Neuropathy of both feet ([facility]   . Chronic diastolic CHF (congestive heart failure) ([facility]     a. echo 04/2006: EF lower limits of nl, mod LVH, mild aortic root dilatation, & mild MR, biatrial enlargement; b. echo 04/2013: EF  60%, mod dilated LA, mild MR & TR, mod pulm HTN w/ RV systolic pressure 53, c. echo 04/21/14: EF 55-60%, unable to exclude WMA, severely dilated LA 6.6 cm, nl RVSP, mildly dilated aortic root  . Kidney stone     a. s/p left ureteral stenting 04/24/14  . COPD (chronic obstructive pulmonary disease) ([facility]   . GERD (gastroesophageal reflux disease)   . Cancer ([facility]     skin  . Arthritis   . CVA 11245,8099   x2  . Falls   . Poor balance     Surgical History: Past Surgical History  Procedure Laterality Date  . Tonsillectomy and adenoidectomy  1959  . Carotid stent insertion  1997  . Cardiac catheterization  1997    [facility]  . Coronary angioplasty  1997    s/p stent placement x 2   . Kidney surgery  05/2013    s/p stent placement   . Bladder surgery      stent placement   . Circumcision  2016  . Stents ureters Bilateral   . Ureteroscopy with holmium laser lithotripsy Left 10/09/2014    Procedure: URETEROSCOPY WITH HOLMIUM LASER LITHOTRIPSY;  Surgeon: AHollice Espy MD;  Location: [facility] ORS;  Service: Urology;  Laterality: Left;  . Cystoscopy w/ ureteral stent removal Left 10/09/2014    Procedure: CYSTOSCOPY  WITH STENT REMOVAL;  Surgeon: Hollice Espy, MD;  Location: [facility] OR[facility];  Service: Urology;  Laterality: Left;  . Cystoscopy with stent placement Left 10/09/2014    Procedure: CYSTOSCOPY WITH STENT PLACEMENT;  Surgeon: Hollice Espy, MD;  Location: [facility] OR[facility];  Service: Urology;  Laterality: Left;    Home Medications:    Medication List       This list is accurate as of: 10/22/15 11:52 AM.  Always use your most recent med list.               acetaminophen 650 MG CR tablet  Commonly known as:  TYLENOL  Take 650 mg by mouth every 8 (eight) hours as needed.     albuterol (2.5 MG/3ML) 0.083% NEBU 3 mL, albuterol (5 MG/ML) 0.5% NEBU 0.5 mL  Inhale 1 mg into the lungs.     PROAIR HFA 108 (90 Base) MCG/ACT inhaler  Generic drug:  albuterol  INHALE 1 TO 2 PUFFS EVERY 4 HOURS AS  NEEDED     ALPRAZolam 0.25 MG tablet  Commonly known as:  XANAX  Take 1 tablet (0.25 mg total) by mouth at bedtime as needed for anxiety.     atorvastatin 40 MG tablet  Commonly known as:  LIPITOR  TAKE 1 TABLET DAILY     BD PEN NEEDLE NANO U/F 32G X 4 MM Misc  Generic drug:  Insulin Pen Needle  USE THREE TIMES A DAY FOR INSULIN ADMINISTRATION     bimatoprost 0.01 % Soln  Commonly known as:  LUMIGAN  Place 1 drop into both eyes at bedtime.     budesonide-formoterol 160-4.5 MCG/ACT inhaler  Commonly known as:  SYMBICORT  Inhale 2 puffs into the lungs 2 (two) times daily.     citalopram 10 MG tablet  Commonly known as:  CELEXA  TAKE 1 TABLET DAILY     ELIQUIS 5 MG Tabs tablet  Generic drug:  apixaban  TAKE 1 TABLET TWICE A DAY     ezetimibe 10 MG tablet  Commonly known as:  ZETIA  Take 1 tablet by mouth daily     finasteride 5 MG tablet  Commonly known as:  PROSCAR  Take 5 mg by mouth daily.     Fish Oil 1000 MG Caps  Take 1 capsule by mouth 3 (three) times daily.     fluticasone 50 MCG/ACT nasal spray  Commonly known as:  FLONASE  Place into both nostrils daily.     gabapentin 100 MG capsule  Commonly known as:  NEURONTIN  TAKE 1 CAPSULE THREE TIMES A DAY     glipiZIDE 10 MG tablet  Commonly known as:  GLUCOTROL  TAKE 1 TABLET TWICE A DAY BEFORE MEALS     insulin lispro 100 UNIT/ML KiwkPen  Commonly known as:  HUMALOG KWIKPEN  18 units with breakfast, 20 units with with lunch and 16 units with dinner     Insulin Syringe-Needle U-100 30G X 1/2" 1 ML Misc  1 each by Does not apply route 3 (three) times daily.     JANUVIA 100 MG tablet  Generic drug:  sitaGLIPtin  TAKE 1 TABLET DAILY     KLOR-CON 10 10 MEQ tablet  Generic drug:  potassium chloride  Take 40 mEq by mouth daily.     KLOR-CON M10 10 MEQ tablet  Generic drug:  potassium chloride     LEVEMIR 100 UNIT/ML injection  Generic drug:  insulin detemir  INJECT 28 UNITS UNDER THE SKIN AT BEDTIME.  ADD 2 UNITS PER DAY UNTIL MORNING SUGAR IS LESS THAN 140     metoprolol succinate 50 MG 24 hr tablet  Commonly known as:  TOPROL-XL  TAKE 1 TABLET TWICE A DAY     nitroGLYCERIN 0.4 MG SL tablet  Commonly known as:  NITROSTAT  Place 0.4 mg under the tongue every 5 (five) minutes as needed.     nystatin-triamcinolone cream  Commonly known as:  MYCOLOG II  Apply 1 application topically 2 (two) times daily.     predniSONE 5 MG tablet  Commonly known as:  DELTASONE  Take 1 tablet (5 mg total) by mouth daily with breakfast.     SPIRIVA HANDIHALER 18 MCG inhalation capsule  Generic drug:  tiotropium  INHALE THE CONTENTS OF 1 CAPSULE DAILY     tamsulosin 0.4 MG Caps capsule  Commonly known as:  FLOMAX  TAKE 1 CAPSULE DAILY     torsemide 20 MG tablet  Commonly known as:  DEMADEX  Take 60 mg by mouth daily.        Allergies:  Allergies  Allergen Reactions  . Contrast Media [Iodinated Diagnostic Agents] Shortness Of Breath  . Morphine And Related Other (See Comments)    Hallucinations   . Niacin And Related Dermatitis    Family History: Family History  Problem Relation Age of Onset  . Heart disease Maternal Grandmother   . Diabetes Maternal Grandmother   . Cancer Neg Hx   . Stroke Neg Hx   . Heart disease Mother     Social History:  reports that he quit smoking about 25 years ago. His smoking use included Cigarettes. He has a 80 pack-year smoking history. He quit smokeless tobacco use about 25 years ago. He reports that he does not drink alcohol or use illicit drugs.  Physical Exam: BP 114/72 mmHg  Pulse 71  Ht '5\' 6"'$  (1.676 m)  Wt 195 lb (88.451 kg)  BMI 31.49 kg/m2  Constitutional:  Alert and oriented, No acute distress.  Obese, presents to office today with his wife. HEENT: [place] AT, moist mucus membranes.  Trachea midline, no masses. Cardiovascular: No clubbing, cyanosis. Scant LE edema wearing compression socks.   Respiratory: Normal respiratory effort, no  increased work of breathing.  No o2 today. GI: Abdomen is soft, nontender, nondistended, no abdominal masses GU: No CVA tenderness.  Skin: No rashes, bruises or suspicious lesions. Neurologic: Grossly intact, no focal deficits, moving all 4 extremities. Psychiatric: Normal mood and affect.  Laboratory Data: Lab Results  Component Value Date   WBC 17.3* 08/05/2015   HGB 14.5 08/05/2015   HCT 43.8 08/05/2015   MCV 86.1 08/05/2015   PLT 240.0 08/05/2015    Lab Results  Component Value Date   CREATININE 1.38 08/05/2015    Lab Results  Component Value Date   PSA 4.1* 04/24/2013    Lab Results  Component Value Date   HGBA1C 7.4* 07/15/2015     Pertinent Imaging:    Study Result     CLINICAL DATA: Assess for residual hydronephrosis. History of left ureteroscopy.  EXAM: RENAL / URINARY TRACT ULTRASOUND COMPLETE  COMPARISON: CT of the abdomen and pelvis on 06/03/2014  FINDINGS: Right Kidney:  Length: 10.8 cm. Echogenicity is normal. Mild cortical thickening noted. No hydronephrosis or focal mass.  Left Kidney:  Length: 11.3 cm. Lower pole intrarenal calculus is 0.6 cm. No hydronephrosis. No focal mass. Mild renal parenchymal thinning.  Bladder:  Appears normal for degree of bladder distention. Bilateral ureteral  jets are noted.  IMPRESSION: 1. No hydronephrosis. 2. Mild renal parenchymal thinning bilaterally. 3. Intrarenal calculus in the lower pole left kidney.   Electronically Signed  By: Nolon Nations M.D.  On: 11/06/2014 13:57    RUS personally reviewed today  Assessment & Plan:  1. Nephrolithiasis Persistent left lower pole stone burden and smaller right nonobstructing renal calculi.   Asymptomatic with no infections x 1 year.  Patient in poor health therefore we discussed deferring further treatment of this unless develops recurrent infection/sepsis.  Patient and his wife continue to be agreeable with this plan.  RUS today  shows slightly increased stone burden without obstruction.  F/u in 1 year with KUB or sooner as needed  2. BPH (benign prostatic hyperplasia) Continue Flomax/ finasteride.  3. CKD Most recent Cr improved/ stable.  Return in about 1 year (around 10/21/2016) for KUB.  Hollice Espy, MD  [facility] [address], [place], [place] [address] [phone]

## 2015-11-05 ENCOUNTER — Ambulatory Visit (INDEPENDENT_AMBULATORY_CARE_PROVIDER_SITE_OTHER): Payer: Medicare Other | Admitting: Internal Medicine

## 2015-11-05 ENCOUNTER — Encounter: Payer: Self-pay | Admitting: Internal Medicine

## 2015-11-05 VITALS — BP 120/74 | HR 61 | Temp 98.1°F | Ht 66.0 in | Wt 194.0 lb

## 2015-11-05 DIAGNOSIS — Z794 Long term (current) use of insulin: Secondary | ICD-10-CM

## 2015-11-05 DIAGNOSIS — E119 Type 2 diabetes mellitus without complications: Secondary | ICD-10-CM

## 2015-11-05 DIAGNOSIS — Z0000 Encounter for general adult medical examination without abnormal findings: Secondary | ICD-10-CM | POA: Diagnosis not present

## 2015-11-05 LAB — HEMOGLOBIN A1C: HEMOGLOBIN A1C: 6.9 % — AB (ref 4.6–6.5)

## 2015-11-05 NOTE — Progress Notes (Signed)
HPI:  Pt presents to the clinic today for his Medicare Wellness Exam. He is also due for a follow up of DM 2.  DM 2: His last A1C was 7.2%. His fasting blood sugars range 113-192. He is taking Glipizide, Januvia, Humalog and Levemir. His last eye exam was 09/2015. His flu shot was 01/2015. Pneumovax and Prevnar are UTD.  Past Medical History:  Diagnosis Date  . Arthritis   . CAD    a. MI 01/29/1996 tx'd w/ TPA @ [facility]; b. Myoview 06/2005: EF 50%, scar @ apex, mild peri-infarct ischemia  . Cancer ([place])    skin  . Chronic atrial fibrillation ([place])    a. since 2006; b. on warfarin  . Chronic diastolic CHF (congestive heart failure) ([place])    a. echo 04/2006: EF lower limits of nl, mod LVH, mild aortic root dilatation, & mild MR, biatrial enlargement; b. echo 04/2013: EF 60%, mod dilated LA, mild MR & TR, mod pulm HTN w/ RV systolic pressure 53, c. echo 04/21/14: EF 55-60%, unable to exclude WMA, severely dilated LA 6.6 cm, nl RVSP, mildly dilated aortic root  . COPD (chronic obstructive pulmonary disease) ([place])   . CVA 0102,7253   x2  . DM   . Falls   . GERD (gastroesophageal reflux disease)   . History of kidney stones   . HYPERLIPIDEMIA   . HYPERTENSION   . Kidney stone    a. s/p left ureteral stenting 04/24/14  . Neuropathy of both feet ([place])   . Poor balance     Current Outpatient Prescriptions  Medication Sig Dispense Refill  . acetaminophen (TYLENOL) 650 MG CR tablet Take 650 mg by mouth every 8 (eight) hours as needed.      Marland Kitchen albuterol (2.5 MG/3ML) 0.083% NEBU 3 mL, albuterol (5 MG/ML) 0.5% NEBU 0.5 mL Inhale 1 mg into the lungs.    . ALPRAZolam (XANAX) 0.25 MG tablet Take 1 tablet (0.25 mg total) by mouth at bedtime as needed for anxiety. 90 tablet 0  . atorvastatin (LIPITOR) 40 MG tablet TAKE 1 TABLET DAILY 90 tablet 3  . BD PEN NEEDLE NANO U/F 32G X 4 MM MISC USE THREE TIMES A DAY FOR INSULIN ADMINISTRATION 270 each 1  . bimatoprost (LUMIGAN) 0.01 % SOLN Place 1 drop into both  eyes at bedtime.    . budesonide-formoterol (SYMBICORT) 160-4.5 MCG/ACT inhaler Inhale 2 puffs into the lungs 2 (two) times daily. 3 Inhaler 3  . citalopram (CELEXA) 10 MG tablet TAKE 1 TABLET DAILY 90 tablet 1  . ELIQUIS 5 MG TABS tablet TAKE 1 TABLET TWICE A DAY 180 tablet 3  . ezetimibe (ZETIA) 10 MG tablet Take 1 tablet by mouth daily 90 tablet 0  . finasteride (PROSCAR) 5 MG tablet Take 5 mg by mouth daily.    . fluticasone (FLONASE) 50 MCG/ACT nasal spray Place into both nostrils daily.    Marland Kitchen gabapentin (NEURONTIN) 100 MG capsule TAKE 1 CAPSULE THREE TIMES A DAY 270 capsule 0  . glipiZIDE (GLUCOTROL) 10 MG tablet TAKE 1 TABLET TWICE A DAY BEFORE MEALS 180 tablet 1  . insulin lispro (HUMALOG KWIKPEN) 100 UNIT/ML KiwkPen 18 units with breakfast, 20 units with with lunch and 16 units with dinner 60 mL 1  . Insulin Syringe-Needle U-100 30G X 1/2" 1 ML MISC 1 each by Does not apply route 3 (three) times daily. 200 each 5  . JANUVIA 100 MG tablet TAKE 1 TABLET DAILY 90 tablet 1  . KLOR-CON M10  10 MEQ tablet     . LEVEMIR 100 UNIT/ML injection INJECT 28 UNITS UNDER THE SKIN AT BEDTIME. ADD 2 UNITS PER DAY UNTIL MORNING SUGAR IS LESS THAN 140 30 mL 4  . metoprolol succinate (TOPROL-XL) 50 MG 24 hr tablet TAKE 1 TABLET TWICE A DAY 180 tablet 1  . nitroGLYCERIN (NITROSTAT) 0.4 MG SL tablet Place 0.4 mg under the tongue every 5 (five) minutes as needed.      . nystatin-triamcinolone (MYCOLOG II) cream Apply 1 application topically 2 (two) times daily.     . Omega-3 Fatty Acids (FISH OIL) 1000 MG CAPS Take 1 capsule by mouth 3 (three) times daily.     . potassium chloride (KLOR-CON 10) 10 MEQ tablet Take 40 mEq by mouth daily.    . predniSONE (DELTASONE) 5 MG tablet Take 1 tablet (5 mg total) by mouth daily with breakfast. (Patient taking differently: Take 5 mg by mouth every other day. ) 90 tablet 3  . PROAIR HFA 108 (90 BASE) MCG/ACT inhaler INHALE 1 TO 2 PUFFS EVERY 4 HOURS AS NEEDED 25.5 g 4  .  SPIRIVA HANDIHALER 18 MCG inhalation capsule INHALE THE CONTENTS OF 1 CAPSULE DAILY 90 capsule 3  . tamsulosin (FLOMAX) 0.4 MG CAPS capsule TAKE 1 CAPSULE DAILY 90 capsule 1  . torsemide (DEMADEX) 20 MG tablet Take 60 mg by mouth daily.      No current facility-administered medications for this visit.     Allergies  Allergen Reactions  . Contrast Media [Iodinated Diagnostic Agents] Shortness Of Breath  . Morphine And Related Other (See Comments)    Hallucinations   . Niacin And Related Dermatitis    Family History  Problem Relation Age of Onset  . Heart disease Maternal Grandmother   . Diabetes Maternal Grandmother   . Cancer Neg Hx   . Stroke Neg Hx   . Heart disease Mother     Social History   Social History  . Marital status: Married    Spouse name: N/A  . Number of children: N/A  . Years of education: N/A   Occupational History  . Not on file.   Social History Main Topics  . Smoking status: Former Smoker    Packs/day: 2.00    Years: 40.00    Types: Cigarettes    Quit date: 05/24/1990  . Smokeless tobacco: Former Systems developer    Quit date: 05/24/1990  . Alcohol use No  . Drug use: No  . Sexual activity: Not Currently   Other Topics Concern  . Not on file   Social History Narrative  . No narrative on file    Hospitiliaztions: None  Health Maintenance:    Flu: 01/2015  Tetanus: 20/2015  Pneumovax: 2010  Prevnar: 06/2013  Zostavax: he reports he received this during a prior  hospitalization  PSA: checked by Dr. Erlene Quan, 04/2013  Colon Screening: never  Eye Doctor: 09/2015  Dental Exam: no, dentures   Providers:   PCP: Webb Silversmith, NP-C  Dermatologist: Dr. Deliah Boston  Cardiologist: Dr. Rockey Situ  Urology: Dr. Erlene Quan  Podiatry: Dr. Caryl Comes  Pulmonologist: Dr. Alva Garnet  Heart Failure Clinic: Dary[facility] Price  Ophthamology: Dr. Matilde Sprang   I have personally reviewed and have noted:  1. The patient's medical and social history 2. Their use of alcohol, tobacco or illicit  drugs 3. Their current medications and supplements 4. The patient's functional ability including ADL's, fall risks, home  safety risks and hearing or visual impairment. 5. Diet and physical activities 6.  Evidence for depression or mood disorder  Subjective:   Review of Systems:   Constitutional: Pt reports fatigue. Denies fever, malaise, headache or abrupt weight changes.  HEENT: Denies eye pain, eye redness, ear pain, ringing in the ears, wax buildup, runny nose, nasal congestion, bloody nose, or sore throat. Respiratory: Pt reports exertional shortness of breath. Denies difficulty breathing, cough or sputum production.   Cardiovascular: Denies chest pain, chest tightness, palpitations or swelling in the hands or feet.  Gastrointestinal: Pt reports constipation. Denies abdominal pain, bloating, diarrhea or blood in the stool.  GU: Denies urgency, frequency, pain with urination, burning sensation, blood in urine, odor or discharge. Musculoskeletal: Denies decrease in range of motion, difficulty with gait, muscle pain or joint pain and swelling.  Skin: Denies redness, rashes, lesions or ulcercations.  Neurological: Denies dizziness, difficulty with memory, difficulty with speech or problems with balance and coordination.  Psych: Denies anxiety, depression, SI/HI.  No other specific complaints in a complete review of systems (except as listed in HPI above).  Objective:  PE:   BP 120/74 (BP Location: Right Arm, Patient Position: Sitting, Cuff Size: Normal)   Pulse 61   Temp 98.1 F (36.7 C) (Oral)   Ht '5\' 6"'$  (1.676 m)   Wt 194 lb (88 kg)   BMI 31.31 kg/m   Wt Readings from Last 3 Encounters:  10/22/15 195 lb (88.5 kg)  10/17/15 196 lb 3.2 oz (89 kg)  09/18/15 198 lb (89.8 kg)    General: Appears his stated age,obese in NAD. Skin: Warm, dry and intact.  Cardiovascular: Normal rate and rhythm. S1,S2 noted.  No murmur, rubs or gallops noted. No JVD or BLE edema. No carotid  bruits noted. Pulmonary/Chest: Normal effort and positive vesicular breath sounds. No respiratory distress. No wheezes, rales or ronchi noted.  Abdomen: Soft and nontender. Normal bowel sounds. No distention or masses noted.  Neurological: Alert and oriented.  Psychiatric: Mood and affect normal.   BMET    Component Value Date/Time   NA 139 08/05/2015 1135   NA 143 02/21/2015 1108   NA 137 08/05/2014 1839   K 3.6 08/05/2015 1135   K 3.6 08/05/2014 1839   CL 100 08/05/2015 1135   CL 106 08/05/2014 1839   CO2 33 (H) 08/05/2015 1135   CO2 23 08/05/2014 1839   GLUCOSE 131 (H) 08/05/2015 1135   GLUCOSE 235 (H) 08/05/2014 1839   BUN 25 (H) 08/05/2015 1135   BUN 26 02/21/2015 1108   BUN 19 08/05/2014 1839   CREATININE 1.38 08/05/2015 1135   CREATININE 1.20 08/05/2014 1839   CALCIUM 10.1 08/05/2015 1135   CALCIUM 8.6 (L) 08/05/2014 1839   GFRNONAA 41 (L) 02/21/2015 1108   GFRNONAA 57 (L) 08/05/2014 1839   GFRAA 48 (L) 02/21/2015 1108   GFRAA >60 08/05/2014 1839    Lipid Panel     Component Value Date/Time   CHOL 120 08/05/2015 1135   TRIG 208.0 (H) 08/05/2015 1135   HDL 32.10 (L) 08/05/2015 1135   CHOLHDL 4 08/05/2015 1135   VLDL 41.6 (H) 08/05/2015 1135   LDLCALC 47 10/04/2014 0940    CBC    Component Value Date/Time   WBC 17.3 (H) 08/05/2015 1135   RBC 5.08 08/05/2015 1135   HGB 14.5 08/05/2015 1135   HGB 13.3 08/05/2014 1839   HCT 43.8 08/05/2015 1135   HCT 40.1 08/05/2014 1839   PLT 240.0 08/05/2015 1135   PLT 246 08/05/2014 1839   MCV 86.1 08/05/2015 1135  MCV 83 08/05/2014 1839   MCH 26.1 10/09/2014 2249   MCHC 33.0 08/05/2015 1135   RDW 16.8 (H) 08/05/2015 1135   RDW 16.3 (H) 08/05/2014 1839   LYMPHSABS 1.7 10/09/2014 2249   LYMPHSABS 2.3 08/05/2014 1839   MONOABS 2.0 (H) 10/09/2014 2249   MONOABS 2.0 (H) 08/05/2014 1839   EOSABS 0.0 10/09/2014 2249   EOSABS 0.1 08/05/2014 1839   BASOSABS 0.1 10/09/2014 2249   BASOSABS 0.1 08/05/2014 1839    Hgb  A1C Lab Results  Component Value Date   HGBA1C 7.4 (H) 07/15/2015      Assessment and Plan:   Medicare Annual Wellness Visit:  Diet: low fat, low carb Physical activity: some walking Depression/mood screen: Negative Hearing: Intact to whispered voice Visual acuity: Grossly normal, performs annual eye exam  ADLs: Capable Fall risk: A couple falls in past year, bruising only, no injuries Home safety: Good Cognitive evaluation: Intact to orientation, naming, recall and repetition EOL planning: No adv directives, full code/ I agree  Preventative Medicine:  Encouraged him to get a flu shot in the fall. Tetanus, Pneumovax and Prevnar UTD. He says he has had a shingles vaccine but can not remember when and where. He wants his PSA done by urology. He has never had a colonoscopy and does not want colon cancer screening. Advised him to see an eye doctor at least annually. No need to see a dentist at this time.  Next appointment: 6 month follow up chronic conditions   Hazim Treadway, NP

## 2015-11-05 NOTE — Patient Instructions (Signed)

## 2015-11-05 NOTE — Assessment & Plan Note (Signed)
Encouraged him to consume a low fat, low carb diet Continue current medications No microalbumin needed due to ACEI/ARB therapy A1C and foot exam today Immunizations UTD Encouraged yearly eye exams

## 2015-11-05 NOTE — Progress Notes (Signed)
Pre visit review using our clinic review tool, if applicable. No additional management support is needed unless otherwise documented below in the visit note.    Flu--01/2015.... TD-2015... Pneumo--2010.... Prevnar--2015... Zostavax--never... Vision--09/2015 Dr Matilde Sprang.Marland KitchenMarland KitchenMarland Kitchen

## 2015-11-10 ENCOUNTER — Telehealth: Payer: Self-pay

## 2015-11-10 NOTE — Telephone Encounter (Signed)
Ok,noted

## 2015-11-10 NOTE — Telephone Encounter (Signed)
Pt does not want lab or other results sent by my chart. Pt request cb instead. 11/05/15 A1C results given to pt per [employer]. Pt voiced understanding.FYI to Avie Echevaria NP.

## 2015-11-14 ENCOUNTER — Other Ambulatory Visit: Payer: Self-pay | Admitting: Internal Medicine

## 2015-11-21 ENCOUNTER — Other Ambulatory Visit: Payer: Self-pay | Admitting: Internal Medicine

## 2015-12-02 ENCOUNTER — Encounter: Payer: Self-pay | Admitting: Internal Medicine

## 2015-12-02 ENCOUNTER — Ambulatory Visit (INDEPENDENT_AMBULATORY_CARE_PROVIDER_SITE_OTHER): Payer: Medicare Other | Admitting: Internal Medicine

## 2015-12-02 VITALS — BP 124/68 | HR 94 | Temp 97.9°F | Wt 195.8 lb

## 2015-12-02 DIAGNOSIS — M25561 Pain in right knee: Secondary | ICD-10-CM

## 2015-12-02 DIAGNOSIS — W19XXXA Unspecified fall, initial encounter: Secondary | ICD-10-CM

## 2015-12-02 DIAGNOSIS — M25562 Pain in left knee: Secondary | ICD-10-CM

## 2015-12-02 DIAGNOSIS — S300XXA Contusion of lower back and pelvis, initial encounter: Secondary | ICD-10-CM | POA: Diagnosis not present

## 2015-12-02 DIAGNOSIS — Y92099 Unspecified place in other non-institutional residence as the place of occurrence of the external cause: Secondary | ICD-10-CM

## 2015-12-02 DIAGNOSIS — Y92009 Unspecified place in unspecified non-institutional (private) residence as the place of occurrence of the external cause: Principal | ICD-10-CM

## 2015-12-02 NOTE — Patient Instructions (Signed)
Generic Knee Exercises EXERCISES RANGE OF MOTION (ROM) AND STRETCHING EXERCISES These exercises may help you when beginning to rehabilitate your injury. Your symptoms may resolve with or without further involvement from your physician, physical therapist, or athletic trainer. While completing these exercises, remember:   Restoring tissue flexibility helps normal motion to return to the joints. This allows healthier, less painful movement and activity.  An effective stretch should be held for at least 30 seconds.  A stretch should never be painful. You should only feel a gentle lengthening or release in the stretched tissue. STRETCH - Knee Extension, Prone  Lie on your stomach on a firm surface, such as a bed or countertop. Place your right / left knee and leg just beyond the edge of the surface. You may wish to place a towel under the far end of your right / left thigh for comfort.  Relax your leg muscles and allow gravity to straighten your knee. Your clinician may advise you to add an ankle weight if more resistance is helpful for you.  You should feel a stretch in the back of your right / left knee. Hold this position for __________ seconds. Repeat __________ times. Complete this stretch __________ times per day. * Your physician, physical therapist, or athletic trainer may ask you to add ankle weight to enhance your stretch.  RANGE OF MOTION - Knee Flexion, Active  Lie on your back with both knees straight. (If this causes back discomfort, bend your opposite knee, placing your foot flat on the floor.)  Slowly slide your heel back toward your buttocks until you feel a gentle stretch in the front of your knee or thigh.  Hold for __________ seconds. Slowly slide your heel back to the starting position. Repeat __________ times. Complete this exercise __________ times per day.  STRETCH - Quadriceps, Prone   Lie on your stomach on a firm surface, such as a bed or padded floor.  Bend your  right / left knee and grasp your ankle. If you are unable to reach your ankle or pant leg, use a belt around your foot to lengthen your reach.  Gently pull your heel toward your buttocks. Your knee should not slide out to the side. You should feel a stretch in the front of your thigh and/or knee.  Hold this position for __________ seconds. Repeat __________ times. Complete this stretch __________ times per day.  STRETCH - Hamstrings, Supine   Lie on your back. Loop a belt or towel over the ball of your right / left foot.  Straighten your right / left knee and slowly pull on the belt to raise your leg. Do not allow the right / left knee to bend. Keep your opposite leg flat on the floor.  Raise the leg until you feel a gentle stretch behind your right / left knee or thigh. Hold this position for __________ seconds. Repeat __________ times. Complete this stretch __________ times per day.  STRENGTHENING EXERCISES These exercises may help you when beginning to rehabilitate your injury. They may resolve your symptoms with or without further involvement from your physician, physical therapist, or athletic trainer. While completing these exercises, remember:   Muscles can gain both the endurance and the strength needed for everyday activities through controlled exercises.  Complete these exercises as instructed by your physician, physical therapist, or athletic trainer. Progress the resistance and repetitions only as guided.  You may experience muscle soreness or fatigue, but the pain or discomfort you are trying to   eliminate should never worsen during these exercises. If this pain does worsen, stop and make certain you are following the directions exactly. If the pain is still present after adjustments, discontinue the exercise until you can discuss the trouble with your clinician. STRENGTH - Quadriceps, Isometrics  Lie on your back with your right / left leg extended and your opposite knee  bent.  Gradually tense the muscles in the front of your right / left thigh. You should see either your knee cap slide up toward your hip or increased dimpling just above the knee. This motion will push the back of the knee down toward the floor/mat/bed on which you are lying.  Hold the muscle as tight as you can without increasing your pain for __________ seconds.  Relax the muscles slowly and completely in between each repetition. Repeat __________ times. Complete this exercise __________ times per day.  STRENGTH - Quadriceps, Short Arcs   Lie on your back. Place a __________ inch towel roll under your knee so that the knee slightly bends.  Raise only your lower leg by tightening the muscles in the front of your thigh. Do not allow your thigh to rise.  Hold this position for __________ seconds. Repeat __________ times. Complete this exercise __________ times per day.  OPTIONAL ANKLE WEIGHTS: Begin with ____________________, but DO NOT exceed ____________________. Increase in 1 pound/0.5 kilogram increments.  STRENGTH - Quadriceps, Straight Leg Raises  Quality counts! Watch for signs that the quadriceps muscle is working to insure you are strengthening the correct muscles and not "cheating" by substituting with healthier muscles.  Lay on your back with your right / left leg extended and your opposite knee bent.  Tense the muscles in the front of your right / left thigh. You should see either your knee cap slide up or increased dimpling just above the knee. Your thigh may even quiver.  Tighten these muscles even more and raise your leg 4 to 6 inches off the floor. Hold for __________ seconds.  Keeping these muscles tense, lower your leg.  Relax the muscles slowly and completely in between each repetition. Repeat __________ times. Complete this exercise __________ times per day.  STRENGTH - Hamstring, Curls  Lay on your stomach with your legs extended. (If you lay on a bed, your feet  may hang over the edge.)  Tighten the muscles in the back of your thigh to bend your right / left knee up to 90 degrees. Keep your hips flat on the bed/floor.  Hold this position for __________ seconds.  Slowly lower your leg back to the starting position. Repeat __________ times. Complete this exercise __________ times per day.  OPTIONAL ANKLE WEIGHTS: Begin with ____________________, but DO NOT exceed ____________________. Increase in 1 pound/0.5 kilogram increments.  STRENGTH - Quadriceps, Squats  Stand in a door frame so that your feet and knees are in line with the frame.  Use your hands for balance, not support, on the frame.  Slowly lower your weight, bending at the hips and knees. Keep your lower legs upright so that they are parallel with the door frame. Squat only within the range that does not increase your knee pain. Never let your hips drop below your knees.  Slowly return upright, pushing with your legs, not pulling with your hands. Repeat __________ times. Complete this exercise __________ times per day.  STRENGTH - Quadriceps, Wall Slides  Follow guidelines for form closely. Increased knee pain often results from poorly placed feet or knees.    Lean against a smooth wall or door and walk your feet out 18-24 inches. Place your feet hip-width apart.  Slowly slide down the wall or door until your knees bend __________ degrees.* Keep your knees over your heels, not your toes, and in line with your hips, not falling to either side.  Hold for __________ seconds. Stand up to rest for __________ seconds in between each repetition. Repeat __________ times. Complete this exercise __________ times per day. * Your physician, physical therapist, or athletic trainer will alter this angle based on your symptoms and progress.   This information is not intended to replace advice given to you by your health care provider. Make sure you discuss any questions you have with your health care  provider.   Document Released: 02/10/2005 Document Revised: 04/19/2014 Document Reviewed: 07/11/2008 Elsevier Interactive Patient Education 2016 [employer]  

## 2015-12-02 NOTE — Progress Notes (Signed)
Subjective:    Patient ID: [name]., male    DOB: [birth year], 82 y.o.   MRN: [id number]  HPI  Pt presents to the clinic today with c/o a fall that occurred this morning. He reports he was getting up from the toilet, and his left leg just gave out from under him. He fell backwards, but did not hit his head. He has a knot on the left side of his back and c/o pain in his left knee. He reports both his knees bother him, worse over the last few weeks. He knows he has some arthritis in his knees, they pop and crack and he has pain when walking up and down stairs. He reports his left knee gives out on him intermittently. He walks with a cane every day. He has not taken anything OTC for his knee pain.   Review of Systems  Past Medical History:  Diagnosis Date  . Arthritis   . CAD    a. MI 01/29/1996 tx'd w/ TPA @ [facility]; b. Myoview 06/2005: EF 50%, scar @ apex, mild peri-infarct ischemia  . Cancer ([place])    skin  . Chronic atrial fibrillation ([place])    a. since 2006; b. on warfarin  . Chronic diastolic CHF (congestive heart failure) ([place])    a. echo 04/2006: EF lower limits of nl, mod LVH, mild aortic root dilatation, & mild MR, biatrial enlargement; b. echo 04/2013: EF 60%, mod dilated LA, mild MR & TR, mod pulm HTN w/ RV systolic pressure 53, c. echo 04/21/14: EF 55-60%, unable to exclude WMA, severely dilated LA 6.6 cm, nl RVSP, mildly dilated aortic root  . COPD (chronic obstructive pulmonary disease) ([place])   . CVA [id number]   x2  . DM   . Falls   . GERD (gastroesophageal reflux disease)   . History of kidney stones   . HYPERLIPIDEMIA   . HYPERTENSION   . Kidney stone    a. s/p left ureteral stenting 04/24/14  . Neuropathy of both feet ([place])   . Poor balance     Current Outpatient Prescriptions  Medication Sig Dispense Refill  . acetaminophen (TYLENOL) 650 MG CR tablet Take 650 mg by mouth every 8 (eight) hours as needed.      Marland Kitchen albuterol (2.5 MG/3ML) 0.083% NEBU 3 mL,  albuterol (5 MG/ML) 0.5% NEBU 0.5 mL Inhale 1 mg into the lungs.    . ALPRAZolam (XANAX) 0.25 MG tablet Take 1 tablet (0.25 mg total) by mouth at bedtime as needed for anxiety. 90 tablet 0  . atorvastatin (LIPITOR) 40 MG tablet TAKE 1 TABLET DAILY 90 tablet 3  . BD PEN NEEDLE NANO U/F 32G X 4 MM MISC USE THREE TIMES A DAY FOR INSULIN ADMINISTRATION 270 each 1  . bimatoprost (LUMIGAN) 0.01 % SOLN Place 1 drop into both eyes at bedtime.    . budesonide-formoterol (SYMBICORT) 160-4.5 MCG/ACT inhaler Inhale 2 puffs into the lungs 2 (two) times daily. 3 Inhaler 3  . citalopram (CELEXA) 10 MG tablet TAKE 1 TABLET DAILY 90 tablet 1  . ELIQUIS 5 MG TABS tablet TAKE 1 TABLET TWICE A DAY 180 tablet 3  . ezetimibe (ZETIA) 10 MG tablet Take 1 tablet by mouth daily 90 tablet 0  . finasteride (PROSCAR) 5 MG tablet Take 5 mg by mouth daily.    . fluticasone (FLONASE) 50 MCG/ACT nasal spray Place into both nostrils daily.    Marland Kitchen gabapentin (NEURONTIN) 100 MG capsule TAKE 1 CAPSULE THREE  TIMES A DAY 270 capsule 0  . glipiZIDE (GLUCOTROL) 10 MG tablet TAKE 1 TABLET TWICE A DAY BEFORE MEALS 180 tablet 1  . HUMALOG KWIKPEN 100 UNIT/ML KiwkPen INJECT 18 UNITS WITH BREAKFAST, 20 UNITS WITH LUNCH, AND 16 UNITS WITH DINNER 60 mL 0  . Insulin Syringe-Needle U-100 30G X 1/2" 1 ML MISC 1 each by Does not apply route 3 (three) times daily. 200 each 5  . JANUVIA 100 MG tablet TAKE 1 TABLET DAILY 90 tablet 0  . LEVEMIR 100 UNIT/ML injection INJECT 28 UNITS UNDER THE SKIN AT BEDTIME. ADD 2 UNITS PER DAY UNTIL MORNING SUGAR IS LESS THAN 140 30 mL 4  . metoprolol succinate (TOPROL-XL) 50 MG 24 hr tablet TAKE 1 TABLET TWICE A DAY 180 tablet 1  . nitroGLYCERIN (NITROSTAT) 0.4 MG SL tablet Place 0.4 mg under the tongue every 5 (five) minutes as needed.      . nystatin-triamcinolone (MYCOLOG II) cream Apply 1 application topically 2 (two) times daily.     . Omega-3 Fatty Acids (FISH OIL) 1000 MG CAPS Take 1 capsule by mouth 3 (three)  times daily.     . potassium chloride (KLOR-CON 10) 10 MEQ tablet Take 40 mEq by mouth daily.    Marland Kitchen PROAIR HFA 108 (90 BASE) MCG/ACT inhaler INHALE 1 TO 2 PUFFS EVERY 4 HOURS AS NEEDED 25.5 g 4  . SPIRIVA HANDIHALER 18 MCG inhalation capsule INHALE THE CONTENTS OF 1 CAPSULE DAILY 90 capsule 3  . tamsulosin (FLOMAX) 0.4 MG CAPS capsule TAKE 1 CAPSULE DAILY 90 capsule 1  . torsemide (DEMADEX) 20 MG tablet Take 60 mg by mouth daily.      No current facility-administered medications for this visit.     Allergies  Allergen Reactions  . Contrast Media [Iodinated Diagnostic Agents] Shortness Of Breath  . Morphine And Related Other (See Comments)    Hallucinations   . Niacin And Related Dermatitis    Family History  Problem Relation Age of Onset  . Heart disease Mother   . Heart disease Maternal Grandmother   . Diabetes Maternal Grandmother   . Cancer Neg Hx   . Stroke Neg Hx     Social History   Social History  . Marital status: Married    Spouse name: N/A  . Number of children: N/A  . Years of education: N/A   Occupational History  . Not on file.   Social History Main Topics  . Smoking status: Former Smoker    Packs/day: 2.00    Years: 40.00    Types: Cigarettes    Quit date: 05/24/1990  . Smokeless tobacco: Former Systems developer    Quit date: 05/24/1990  . Alcohol use No  . Drug use: No  . Sexual activity: Not Currently   Other Topics Concern  . Not on file   Social History Narrative  . No narrative on file     Constitutional: Denies fever, malaise, fatigue, headache or abrupt weight changes.  Respiratory: Denies difficulty breathing, shortness of breath, cough or sputum production.   Cardiovascular: Denies chest pain, chest tightness, palpitations or swelling in the hands or feet.  Musculoskeletal: Pt reports knee pain. Denies decrease in range of motion, muscle pain or joint swelling.  Skin: Pt reports knot on back. Denies redness, rashes, lesions or ulcercations.    Neurological: Pt reports problems with balance. Denies dizziness, difficulty with memory, difficulty with speech.    No other specific complaints in a complete review of systems (except  as listed in HPI above).     Objective:   Physical Exam   BP 124/68   Pulse 94   Temp 97.9 F (36.6 C) (Oral)   Wt 195 lb 12 oz (88.8 kg)   SpO2 96%   BMI 31.59 kg/m  Wt Readings from Last 3 Encounters:  12/02/15 195 lb 12 oz (88.8 kg)  11/05/15 194 lb (88 kg)  10/22/15 195 lb (88.5 kg)    General: Appears his stated age, chronically ill appearing in NAD. Skin: 5 cm x 4 cm hematoma of left midback.  Pulmonary/Chest: Normal effort and positive vesicular breath sounds. No respiratory distress. No wheezes, rales or ronchi noted.  Musculoskeletal: Normal flexion and extension of bilateral knees. Right knee joint enlarged. No pain with palpation of either knee. Positive Lachman on the left. He has difficulty arising from a sitting to a standing position. Gait slow but steady with the use of the cane. Neurological: Alert and oriented.   BMET    Component Value Date/Time   NA 139 08/05/2015 1135   NA 143 02/21/2015 1108   NA 137 08/05/2014 1839   K 3.6 08/05/2015 1135   K 3.6 08/05/2014 1839   CL 100 08/05/2015 1135   CL 106 08/05/2014 1839   CO2 33 (H) 08/05/2015 1135   CO2 23 08/05/2014 1839   GLUCOSE 131 (H) 08/05/2015 1135   GLUCOSE 235 (H) 08/05/2014 1839   BUN 25 (H) 08/05/2015 1135   BUN 26 02/21/2015 1108   BUN 19 08/05/2014 1839   CREATININE 1.38 08/05/2015 1135   CREATININE 1.20 08/05/2014 1839   CALCIUM 10.1 08/05/2015 1135   CALCIUM 8.6 (L) 08/05/2014 1839   GFRNONAA 41 (L) 02/21/2015 1108   GFRNONAA 57 (L) 08/05/2014 1839   GFRAA 48 (L) 02/21/2015 1108   GFRAA >60 08/05/2014 1839    Lipid Panel     Component Value Date/Time   CHOL 120 08/05/2015 1135   TRIG 208.0 (H) 08/05/2015 1135   HDL 32.10 (L) 08/05/2015 1135   CHOLHDL 4 08/05/2015 1135   VLDL 41.6 (H)  08/05/2015 1135   LDLCALC 47 10/04/2014 0940    CBC    Component Value Date/Time   WBC 17.3 (H) 08/05/2015 1135   RBC 5.08 08/05/2015 1135   HGB 14.5 08/05/2015 1135   HGB 13.3 08/05/2014 1839   HCT 43.8 08/05/2015 1135   HCT 40.1 08/05/2014 1839   PLT 240.0 08/05/2015 1135   PLT 246 08/05/2014 1839   MCV 86.1 08/05/2015 1135   MCV 83 08/05/2014 1839   MCH 26.1 10/09/2014 2249   MCHC 33.0 08/05/2015 1135   RDW 16.8 (H) 08/05/2015 1135   RDW 16.3 (H) 08/05/2014 1839   LYMPHSABS 1.7 10/09/2014 2249   LYMPHSABS 2.3 08/05/2014 1839   MONOABS 2.0 (H) 10/09/2014 2249   MONOABS 2.0 (H) 08/05/2014 1839   EOSABS 0.0 10/09/2014 2249   EOSABS 0.1 08/05/2014 1839   BASOSABS 0.1 10/09/2014 2249   BASOSABS 0.1 08/05/2014 1839    Hgb A1C Lab Results  Component Value Date   HGBA1C 6.9 (H) 11/05/2015           Assessment & Plan:  Hematoma of left mid back secondary to fall at home:  Will monitor the hematoma, should resolve with time Discussed the s/s of infected hematomas  Bilateral knee pain, L>R:  Offered xray of bilateral knees He is not interested in referral to ortho at this time Tylenol as needed for pain Knee exercises given  Offered referral to PT for strengthening, he will think about this and let us know  RTC in 5 months to follow up chronic conditions Raesean Bartoletti, NP

## 2015-12-03 MED ORDER — INSULIN DETEMIR 100 UNIT/ML FLEXPEN: 28.0000 [IU] | PEN_INJECTOR | Freq: Every day | SUBCUTANEOUS | 1 refills | Status: DC

## 2015-12-03 MED ORDER — SITAGLIPTIN PHOSPHATE 100 MG PO TABS: 100.0000 mg | ORAL_TABLET | Freq: Every day | ORAL | 1 refills | Status: DC

## 2015-12-03 NOTE — Addendum Note (Signed)
Addended by: Lurlean Nanny on: 12/03/2015 10:11 AM   Modules accepted: Orders

## 2015-12-04 ENCOUNTER — Other Ambulatory Visit: Payer: Self-pay | Admitting: Internal Medicine

## 2015-12-04 ENCOUNTER — Ambulatory Visit: Payer: Medicare Other | Admitting: Pulmonary Disease

## 2015-12-17 ENCOUNTER — Other Ambulatory Visit: Payer: Self-pay | Admitting: Cardiovascular Disease

## 2015-12-19 ENCOUNTER — Encounter: Payer: Self-pay | Admitting: Family

## 2015-12-19 ENCOUNTER — Ambulatory Visit: Payer: Medicare Other | Attending: Family | Admitting: Family

## 2015-12-19 ENCOUNTER — Ambulatory Visit (INDEPENDENT_AMBULATORY_CARE_PROVIDER_SITE_OTHER): Payer: Medicare Other | Admitting: Pulmonary Disease

## 2015-12-19 ENCOUNTER — Encounter: Payer: Self-pay | Admitting: Pulmonary Disease

## 2015-12-19 VITALS — BP 116/65 | HR 67 | Resp 18 | Ht 66.0 in | Wt 194.0 lb

## 2015-12-19 VITALS — BP 126/74 | HR 75 | Ht 66.0 in | Wt 194.6 lb

## 2015-12-19 DIAGNOSIS — K219 Gastro-esophageal reflux disease without esophagitis: Secondary | ICD-10-CM | POA: Diagnosis not present

## 2015-12-19 DIAGNOSIS — J449 Chronic obstructive pulmonary disease, unspecified: Secondary | ICD-10-CM

## 2015-12-19 DIAGNOSIS — Z8673 Personal history of transient ischemic attack (TIA), and cerebral infarction without residual deficits: Secondary | ICD-10-CM | POA: Insufficient documentation

## 2015-12-19 DIAGNOSIS — I10 Essential (primary) hypertension: Secondary | ICD-10-CM

## 2015-12-19 DIAGNOSIS — E119 Type 2 diabetes mellitus without complications: Secondary | ICD-10-CM | POA: Insufficient documentation

## 2015-12-19 DIAGNOSIS — Z85828 Personal history of other malignant neoplasm of skin: Secondary | ICD-10-CM | POA: Diagnosis not present

## 2015-12-19 DIAGNOSIS — Z833 Family history of diabetes mellitus: Secondary | ICD-10-CM | POA: Insufficient documentation

## 2015-12-19 DIAGNOSIS — Z823 Family history of stroke: Secondary | ICD-10-CM | POA: Diagnosis not present

## 2015-12-19 DIAGNOSIS — I110 Hypertensive heart disease with heart failure: Secondary | ICD-10-CM | POA: Diagnosis not present

## 2015-12-19 DIAGNOSIS — Z7902 Long term (current) use of antithrombotics/antiplatelets: Secondary | ICD-10-CM | POA: Diagnosis not present

## 2015-12-19 DIAGNOSIS — Z8249 Family history of ischemic heart disease and other diseases of the circulatory system: Secondary | ICD-10-CM | POA: Insufficient documentation

## 2015-12-19 DIAGNOSIS — E785 Hyperlipidemia, unspecified: Secondary | ICD-10-CM | POA: Insufficient documentation

## 2015-12-19 DIAGNOSIS — R0600 Dyspnea, unspecified: Secondary | ICD-10-CM

## 2015-12-19 DIAGNOSIS — Z87442 Personal history of urinary calculi: Secondary | ICD-10-CM | POA: Insufficient documentation

## 2015-12-19 DIAGNOSIS — Z91041 Radiographic dye allergy status: Secondary | ICD-10-CM | POA: Diagnosis not present

## 2015-12-19 DIAGNOSIS — I482 Chronic atrial fibrillation, unspecified: Secondary | ICD-10-CM

## 2015-12-19 DIAGNOSIS — Z885 Allergy status to narcotic agent status: Secondary | ICD-10-CM | POA: Diagnosis not present

## 2015-12-19 DIAGNOSIS — Z87891 Personal history of nicotine dependence: Secondary | ICD-10-CM | POA: Insufficient documentation

## 2015-12-19 DIAGNOSIS — R600 Localized edema: Secondary | ICD-10-CM | POA: Diagnosis not present

## 2015-12-19 DIAGNOSIS — I2510 Atherosclerotic heart disease of native coronary artery without angina pectoris: Secondary | ICD-10-CM | POA: Diagnosis not present

## 2015-12-19 DIAGNOSIS — I5032 Chronic diastolic (congestive) heart failure: Secondary | ICD-10-CM | POA: Insufficient documentation

## 2015-12-19 DIAGNOSIS — E669 Obesity, unspecified: Secondary | ICD-10-CM | POA: Diagnosis not present

## 2015-12-19 DIAGNOSIS — Z794 Long term (current) use of insulin: Secondary | ICD-10-CM | POA: Insufficient documentation

## 2015-12-19 DIAGNOSIS — Z955 Presence of coronary angioplasty implant and graft: Secondary | ICD-10-CM | POA: Diagnosis not present

## 2015-12-19 NOTE — Patient Instructions (Signed)
Continue weighing daily and call for an overnight weight gain of > 2 pounds or a weekly weight gain of >5 pounds. 

## 2015-12-19 NOTE — Progress Notes (Signed)
Subjective:    Patient ID: [name]., male    DOB: 04-28-1931, 82 y.o.   MRN: [id number]  Congestive Heart Failure  Presents for follow-up visit. The disease course has been stable. Associated symptoms include edema, fatigue and shortness of breath. Pertinent negatives include no abdominal pain, chest pain, orthopnea or palpitations. The symptoms have been stable. Past treatments include beta blockers and salt and fluid restriction. The treatment provided moderate relief. Compliance with prior treatments has been good. His past medical history is significant for arrhythmia, CAD, chronic lung disease, CVA, DM and HTN. He has one 1st degree relative with heart disease.  Hypertension  This is a chronic problem. The problem is unchanged. The problem is controlled. Associated symptoms include peripheral edema and shortness of breath. Pertinent negatives include no chest pain, neck pain or palpitations. There are no associated agents to hypertension. Risk factors for coronary artery disease include diabetes mellitus, dyslipidemia, family history, male gender and sedentary lifestyle. Past treatments include beta blockers, diuretics and lifestyle changes. The current treatment provides significant improvement. Compliance problems include exercise.  Hypertensive end-organ damage includes CAD/MI, CVA and heart failure.   Past Medical History:  Diagnosis Date  . Arthritis   . CAD    a. MI 01/29/1996 tx'd w/ TPA @ [place]; b. Myoview 06/2005: EF 50%, scar @ apex, mild peri-infarct ischemia  . Cancer ([place])    skin  . Chronic atrial fibrillation ([place])    a. since 2006; b. on warfarin  . Chronic diastolic CHF (congestive heart failure) ([place])    a. echo 04/2006: EF lower limits of nl, mod LVH, mild aortic root dilatation, & mild MR, biatrial enlargement; b. echo 04/2013: EF 60%, mod dilated LA, mild MR & TR, mod pulm HTN w/ RV systolic pressure 53, c. echo 04/21/14: EF 55-60%, unable to exclude WMA, severely  dilated LA 6.6 cm, nl RVSP, mildly dilated aortic root  . COPD (chronic obstructive pulmonary disease) ([place])   . CVA 3086,5784   x2  . DM   . Falls   . GERD (gastroesophageal reflux disease)   . History of kidney stones   . HYPERLIPIDEMIA   . HYPERTENSION   . Kidney stone    a. s/p left ureteral stenting 04/24/14  . Neuropathy of both feet ([place])   . Poor balance     Past Surgical History:  Procedure Laterality Date  . BLADDER SURGERY     stent placement   . CARDIAC CATHETERIZATION  1997   [facility]  . CAROTID STENT INSERTION  1997  . CIRCUMCISION  2016  . CORONARY ANGIOPLASTY  1997   s/p stent placement x 2   . CYSTOSCOPY W/ URETERAL STENT REMOVAL Left 10/09/2014   Procedure: CYSTOSCOPY WITH STENT REMOVAL;  Surgeon: Hollice Espy, MD;  Location: [facility] OR[facility];  Service: Urology;  Laterality: Left;  . CYSTOSCOPY WITH STENT PLACEMENT Left 10/09/2014   Procedure: CYSTOSCOPY WITH STENT PLACEMENT;  Surgeon: Hollice Espy, MD;  Location: [facility];  Service: Urology;  Laterality: Left;  . KIDNEY SURGERY  05/2013   s/p stent placement   . stents ureters Bilateral   . TONSILLECTOMY AND ADENOIDECTOMY  1959  . URETEROSCOPY WITH HOLMIUM LASER LITHOTRIPSY Left 10/09/2014   Procedure: URETEROSCOPY WITH HOLMIUM LASER LITHOTRIPSY;  Surgeon: Hollice Espy, MD;  Location: [facility];  Service: Urology;  Laterality: Left;    Family History  Problem Relation Age of Onset  . Heart disease Mother   . Heart disease Maternal Grandmother   .  Diabetes Maternal Grandmother   . Cancer Neg Hx   . Stroke Neg Hx     Social History  Substance Use Topics  . Smoking status: Former Smoker    Packs/day: 2.00    Years: 40.00    Types: Cigarettes    Quit date: 05/24/1990  . Smokeless tobacco: Former Systems developer    Quit date: 05/24/1990  . Alcohol use No    Allergies  Allergen Reactions  . Contrast Media [Iodinated Diagnostic Agents] Shortness Of Breath  . Morphine And Related Other (See Comments)     Hallucinations   . Niacin And Related Dermatitis    Prior to Admission medications   Medication Sig Start Date End Date Taking? Authorizing Provider  acetaminophen (TYLENOL) 650 MG CR tablet Take 650 mg by mouth every 8 (eight) hours as needed.     Yes Historical Provider, MD  albuterol (2.5 MG/3ML) 0.083% NEBU 3 mL, albuterol (5 MG/ML) 0.5% NEBU 0.5 mL Inhale 1 mg into the lungs.   Yes Historical Provider, MD  ALPRAZolam (XANAX) 0.25 MG tablet Take 1 tablet (0.25 mg total) by mouth at bedtime as needed for anxiety. 11/05/14  Yes Jearld Fenton, NP  atorvastatin (LIPITOR) 40 MG tablet TAKE 1 TABLET DAILY 10/21/15  Yes Alisa Graff, FNP  BD PEN NEEDLE NANO U/F 32G X 4 MM MISC USE THREE TIMES A DAY FOR INSULIN ADMINISTRATION 06/23/15  Yes Jearld Fenton, NP  bimatoprost (LUMIGAN) 0.01 % SOLN Place 1 drop into both eyes at bedtime.   Yes Historical Provider, MD  budesonide-formoterol (SYMBICORT) 160-4.5 MCG/ACT inhaler Inhale 2 puffs into the lungs 2 (two) times daily. 10/20/15  Yes Wilhelmina Mcardle, MD  citalopram (CELEXA) 10 MG tablet TAKE 1 TABLET DAILY 09/24/15  Yes Jearld Fenton, NP  ELIQUIS 5 MG TABS tablet TAKE 1 TABLET TWICE A DAY 06/25/15  Yes Minna Merritts, MD  ezetimibe (ZETIA) 10 MG tablet Take 1 tablet by mouth daily 10/21/15  Yes Jearld Fenton, NP  finasteride (PROSCAR) 5 MG tablet Take 5 mg by mouth daily.   Yes Historical Provider, MD  fluticasone (FLONASE) 50 MCG/ACT nasal spray Place into both nostrils daily.   Yes Historical Provider, MD  gabapentin (NEURONTIN) 100 MG capsule TAKE 1 CAPSULE THREE TIMES A DAY 12/05/15  Yes Jearld Fenton, NP  glipiZIDE (GLUCOTROL) 10 MG tablet TAKE 1 TABLET TWICE A DAY BEFORE MEALS 07/10/15  Yes Jearld Fenton, NP  HUMALOG KWIKPEN 100 UNIT/ML KiwkPen INJECT 18 UNITS WITH BREAKFAST, 20 UNITS WITH LUNCH, AND 16 UNITS WITH DINNER 11/14/15  Yes Jearld Fenton, NP  Insulin Detemir (LEVEMIR FLEXPEN) 100 UNIT/ML Pen Inject 28 Units into the skin daily at 10  pm. Patient taking differently: Inject 42 Units into the skin daily at 10 pm.  12/03/15  Yes Jearld Fenton, NP  Insulin Syringe-Needle U-100 30G X 1/2" 1 ML MISC 1 each by Does not apply route 3 (three) times daily. 07/19/14  Yes Jearld Fenton, NP  metolazone (ZAROXOLYN) 5 MG tablet TAKE 1 TABLET DAILY AS NEEDED 12/17/15  Yes Minna Merritts, MD  metoprolol succinate (TOPROL-XL) 50 MG 24 hr tablet TAKE 1 TABLET TWICE A DAY 07/21/15  Yes Abner Greenspan, MD  nitroGLYCERIN (NITROSTAT) 0.4 MG SL tablet Place 0.4 mg under the tongue every 5 (five) minutes as needed.     Yes Historical Provider, MD  nystatin-triamcinolone (MYCOLOG II) cream Apply 1 application topically 2 (two) times daily.  03/12/14  Yes Historical Provider, MD  Omega-3 Fatty Acids (FISH OIL) 1000 MG CAPS Take 1 capsule by mouth 3 (three) times daily.    Yes Historical Provider, MD  potassium chloride (KLOR-CON 10) 10 MEQ tablet Take 10 mEq by mouth 3 (three) times daily.    Yes Historical Provider, MD  PROAIR HFA 108 (90 BASE) MCG/ACT inhaler INHALE 1 TO 2 PUFFS EVERY 4 HOURS AS NEEDED 10/21/14  Yes Alisa Graff, FNP  sitaGLIPtin (JANUVIA) 100 MG tablet Take 1 tablet (100 mg total) by mouth daily. 12/03/15  Yes Jearld Fenton, NP  SPIRIVA HANDIHALER 18 MCG inhalation capsule INHALE THE CONTENTS OF 1 CAPSULE DAILY 02/11/15  Yes Alisa Graff, FNP  tamsulosin (FLOMAX) 0.4 MG CAPS capsule TAKE 1 CAPSULE DAILY 10/20/15  Yes Jearld Fenton, NP  torsemide (DEMADEX) 20 MG tablet Take 60 mg by mouth daily.    Yes Historical Provider, MD      Review of Systems  Constitutional: Positive for fatigue. Negative for appetite change.  HENT: Positive for congestion. Negative for postnasal drip and sore throat.   Eyes: Negative.   Respiratory: Positive for cough ("little bit") and shortness of breath. Negative for chest tightness and wheezing.   Cardiovascular: Positive for leg swelling. Negative for chest pain and palpitations.  Gastrointestinal:  Positive for abdominal distention. Negative for abdominal pain.  Endocrine: Negative.   Genitourinary: Negative.   Musculoskeletal: Negative for back pain and neck pain.  Skin: Negative.   Allergic/Immunologic: Negative.   Neurological: Positive for light-headedness. Negative for dizziness.  Hematological: Negative for adenopathy. Does not bruise/bleed easily.  Psychiatric/Behavioral: Negative for dysphoric mood. The patient is not nervous/anxious.        Objective:   Physical Exam  Constitutional: He is oriented to person, place, and time. He appears well-developed and well-nourished.  HENT:  Head: Normocephalic and atraumatic.  Eyes: Conjunctivae are normal. Pupils are equal, round, and reactive to light.  Neck: Normal range of motion. Neck supple.  Cardiovascular: Normal rate.  An irregular rhythm present.  Pulmonary/Chest: Effort normal. He has no wheezes. He has no rales.  Abdominal: Soft. He exhibits distension. There is no tenderness.  Musculoskeletal: He exhibits edema (2+ pitting edema in bilateral lower legs). He exhibits no tenderness.  Neurological: He is alert and oriented to person, place, and time.  Skin: Skin is warm and dry.  Resolving hematoma present on left mid back.   Psychiatric: He has a normal mood and affect. His behavior is normal. Thought content normal.  Nursing note and vitals reviewed.   BP 116/65   Pulse 67   Resp 18   Ht '5\' 6"'$  (1.676 m)   Wt 194 lb (88 kg)   SpO2 97%   BMI 31.31 kg/m        Assessment & Plan:  1: Chronic heart failure with preserved ejection fraction- Patient presents with fatigue and shortness of breath with little exertion (Class III) which improves quickly upon rest. He denies symptoms at rest and has been walking more than he was. Has chronic edema in his bilateral lower legs and does wear his TED socks daily. Admits that he's not the best about elevating them and he was encouraged to elevate them during the day as much  as possible. He continues to weigh himself daily and says that his weight has been stable. By our scale, he has lost 3.4 pounds since he was last here on 09/18/15. Reminded him to call for an overnight weight gain  of >2 pounds or a weekly weight gain of >5 pounds. He is not adding any salt to his food and tries to eat low sodium foods.  2: HTN- Blood pressure looks good today. Continue medications at this time. 3: Atrial fibrillation- Currently rate controlled on toprol XL along with eliquis. Sees his cardiologist Rockey Situ) on 01/05/16. 4: Diabetes- He says that his glucose ranges from 150-160's. Is taking glucotrol, levemir, humalog and januvia.   Patient did not bring his medications nor a list. Each medication was verbally reviewed with the patient and he was encouraged to bring the bottles to every visit to confirm accuracy of list.  Recently had a fall in his bathroom and has a resolving hematoma on his left mid back. Has been seen by his PCP regarding this.   Return in 3 months or sooner for any questions/problems before then.

## 2015-12-21 NOTE — Progress Notes (Signed)
PULMONARY OFFICE FOLLOW UP NOTE  Date of initial consultation: 03/10/15 Reason for consultation: COPD Initial HPI: 32 M diagnosed with COPD approx 2004. Previously followed by Dr Raul Del. Wishes to establish care here. At his baseline, he is dyspneic with minimal exertion limited to ambulating no more than 50 meters and less than one flight of stairs. He has little day to day variation. He produces modest amount of white mucus on most days. He has never had hemoptysis. He has been maintained on Symbicort and Spiriva with PRN albuterol which he uses 1-2 times per day on average. He is also on prednisone 5 mg daily which he believes has been very beneficial. His records from Dr Gust Brooms office also indicate a diagnosis of pulmonary fibrosis but his CXR does not seem to demonstrate this and a recent CT abd/pelvis (06/03/14) reveals no fibrosis in the visualized portion of the lung bases. That CT does demonstrate a nodule in the LLL which has not been re-imaged. He also has OSA diagnosed several years ago for which he is on CPAP. He tolerates this well. Lastly, he has chronic AF and is followed by cardiology for this. Initial IMP/PLAN: COPD, Obesity, Restrictive physiology - due to obesity, Severe dyspnea - multifactorial including COPD, CAF, obesity, deconditioning, Lung nodule on CTAP 06/03/14, OSA - well controlled on CPAP, DM2 - hyperglycemia on most recent BMET. PLAN: Cont Symbicort and Spiriva as controller regimen, Cont prednisone @ 5 mg daily for now, Cont PRN albuterol, CT chest to re-eval pulmonary nodule in LLL,   CT chest 04/18/15: Interstitial lung disease characterized by symmetric upper lobe predominant extensive fine nodularity, interlobular septal thickening and ground-glass attenuation. Findings have mildly to moderately progressed since 02/01/2006 chest CT. Stable dominant 8 mm left lower lobe pulmonary nodule, for which 10 month stability has been demonstrated. Stable mild cardiomegaly. Left  main and 3 vessel coronary atherosclerosis  ROV 04/24/15: No change overall. Trial off Symbicort as it has not been clearly beneficial  ROV 09/09/15: Has lost 6 pounds with improvement in dyspnea and glycemic control. No worsening off Symbicort. Changed prednisone to 5 mg qod  ROV 10/17/15: Increased dyspnea and intermittent wheezing. Resume Symbicort. Continue prednisone @ 5 mg qod.   ROV 12/19/15: off prednisone altogether. No change in symptoms. Has lost a couple more pounds.   SUBJ: No new complaints. Remains on Spiriva and Symbicort. Completely off prednisone. Denies CP, fever, purulent sputum, hemoptysis, LE edema and calf tenderness.    OBJ:  Vitals:   12/19/15 1154  BP: 126/74  Pulse: 75  SpO2: 93%  Weight: 194 lb 9.6 oz (88.3 kg)  Height: '5\' 6"'$  (1.676 m)    EXAM:  Gen: WDWN in NAD HEENT: All WNL Lungs: Diminished BS, no wheezes Cardiovascular: IRIR, no M Abdomen: Obese, soft, NT +BS Ext: 1-2+ pitting pretibial edema  DATA:    IMPRESSION:   Chronic dyspnea - improving with lifestyle changes as noted above Former smoker Mild interstitial lung disease, NOS Restrictive pulmonary physiology due to ILD and obesity LLL Lung nodule - stable over approx one year. No further F/U planned OSA - well controlled on CPAP  PLAN:  Cont Symbicort and Spiriva  Cont PRN albuterol Continue efforts @ weight loss and reconditioning ROV 4-6 months   Merton Border, MD PCCM service Mobile [phone] Pager [phone]

## 2015-12-22 ENCOUNTER — Other Ambulatory Visit: Payer: Self-pay | Admitting: Cardiovascular Disease

## 2016-01-02 ENCOUNTER — Other Ambulatory Visit: Payer: Self-pay | Admitting: Family

## 2016-01-05 ENCOUNTER — Encounter: Payer: Self-pay | Admitting: Cardiovascular Disease

## 2016-01-05 ENCOUNTER — Ambulatory Visit (INDEPENDENT_AMBULATORY_CARE_PROVIDER_SITE_OTHER): Payer: Medicare Other | Admitting: Cardiovascular Disease

## 2016-01-05 VITALS — BP 112/78 | HR 70 | Ht 66.0 in | Wt 191.8 lb

## 2016-01-05 DIAGNOSIS — I5032 Chronic diastolic (congestive) heart failure: Secondary | ICD-10-CM

## 2016-01-05 DIAGNOSIS — I482 Chronic atrial fibrillation, unspecified: Secondary | ICD-10-CM

## 2016-01-05 DIAGNOSIS — E1159 Type 2 diabetes mellitus with other circulatory complications: Secondary | ICD-10-CM

## 2016-01-05 DIAGNOSIS — Z794 Long term (current) use of insulin: Secondary | ICD-10-CM

## 2016-01-05 DIAGNOSIS — I10 Essential (primary) hypertension: Secondary | ICD-10-CM

## 2016-01-05 DIAGNOSIS — J449 Chronic obstructive pulmonary disease, unspecified: Secondary | ICD-10-CM

## 2016-01-05 DIAGNOSIS — E785 Hyperlipidemia, unspecified: Secondary | ICD-10-CM | POA: Diagnosis not present

## 2016-01-05 DIAGNOSIS — I2510 Atherosclerotic heart disease of native coronary artery without angina pectoris: Secondary | ICD-10-CM | POA: Diagnosis not present

## 2016-01-05 NOTE — Patient Instructions (Signed)

## 2016-01-05 NOTE — Progress Notes (Signed)
Cardiology Office Note  Date:  01/05/2016   ID:  [name]., DOB 1932/03/13, MRN [id number]  PCP:  Webb Silversmith, NP   Chief Complaint  Patient presents with  . other    6 month f/u. Meds reviewd verbally with pt.    HPI:  [name] is a pleasant 82 year old gentleman, who has a history of diabetes type 2, coronary artery disease, status post myocardial infarction treated with TPA at [facility] in January 29, 1996.   chronic atrial fibrillation since 2006, on warfarin. Walks with a cane. 8119 had a cva, uncertain if he was on anticoagulation at the time History off falls.  Seen by Dr. Raul Del, pulmonary. Also seen by CHF clinic. Several admissions to the hospital dating back to December 2014 with urine output function, urinary tract infections, status post stent placement by Dr. Elenor Quinones. He presents today for evaluation of his acute on chronic diastolic CHF Poorly controlled diabetes  In follow-up today, weight has decreased down to low 190 range Off prednisone 6 weeks Weight down to 191 No change to breathing without prednisone Wearing compression Takes torsemide 40 mg twice a day He reports that he is been following a strict diet  Lab work reviewed  Creatinine 08/05/2015: 1.38  Dramatic improvement in his hemoglobin A1c down to 6.9, previously 9.5 LDL 61, total chol  120  EKG on today's visit showing atrial fibrillation with rate 70 bpm, old anterior MI, consider inferior MI  Other past medical history history of strokes documented on MRI. History of fall, fell backwards when walking with his walker  hospital 06/03/2014 with discharge on the 26th with urinary tract infection requiring antibiotics.   hospital admission 05/02/2014 with acute stroke. He was off warfarin for circumcision in early January 2016. He did have Lovenox bridge MRI showed multiple embolic strokes, left parietal, occipital areas. He was seen by neurology. He reports having continued  vision problems. INR was 2 and he was continued on his warfarin. He is doing home physical therapy. At the time of discharge he was started on diltiazem. He does have chronic lower extremity edema. He has not received the diltiazem yet and is waiting for mail-order.  Recent carotid ultrasound showing bilateral mild plaque dated 05/03/2014 Echocardiogram 05/03/2014 showing normal LV systolic function, normal right ventricular systolic pressure MRI brain showing multiple punctate foci acute infarction left parietal, left occipital, left frontal lobes likely embolic, chronic right occipital infarct  EKG on today's visit shows age fibrillation with ventricular rate 75 bpm, old anterior MI, inferior MI INR 2 days ago was 1.6. INR today 1.8  Hemoglobin A1c elevated at 9.6  hospital admission January 26 2014 with discharge October 20 with urosepsis, bladder outflow obstruction.cultures grew [facility] was called to place a Foley catheter. Initial creatinine 1.56, BUN 25.  Echocardiogram 01/25/2014 showing moderate pulmonary hypertension, normal ejection fraction Notes indicate he had antibiotics, diuresis through his hospital course with improvement of his renal function down to creatinine 1.26. Creatinine was checked 2 days ago and was up to 1.4 with BUN 29 He had a Foley catheter for 10 days which was removed  He fell in January 2015 after taking a nap, he fell into his dresser drawer. Significant trauma to his left arm He fell possibly in April 2015the details are unavailable. Did not hurt himself [place] again recently while in the bathroom. Lost his footing, fell backwards, broke the toilet lid, large left flank hematoma  Myoview in March 2007 showed an ejection  fraction of 50% with an old scar at the apex and mild peri-infarct ischemia.   echocardiogram in January 2008 showed an ejection fraction at the lower limits of normal with moderate LVH, mild aortic root dilatation, and mild  mitral regurgitation.  There was biatrial enlargement.   echocardiogram January 2015 showing ejection fraction 60%, moderately dilated left atrium, mild MR and TR, moderate pulmonary hypertension with right ventricular systolic pressure 53   Previous carotid Dopplers in January of 2008 showed 0-39% stenosis bilaterally. Carotid ultrasound may 2009 showing mild bilateral plaque, no significant stenoses  PMH:   has a past medical history of Arthritis; CAD; Cancer ([place]); Chronic atrial fibrillation ([place]); Chronic diastolic CHF (congestive heart failure) (HCC); COPD (chronic obstructive pulmonary disease) ([place]); CVA (1610,9604); DM; Falls; GERD (gastroesophageal reflux disease); History of kidney stones; HYPERLIPIDEMIA; HYPERTENSION; Kidney stone; Neuropathy of both feet ([place]); and Poor balance.  PSH:    Past Surgical History:  Procedure Laterality Date  . BLADDER SURGERY     stent placement   . CARDIAC CATHETERIZATION  1997   [facility]  . CAROTID STENT INSERTION  1997  . CIRCUMCISION  2016  . CORONARY ANGIOPLASTY  1997   s/p stent placement x 2   . CYSTOSCOPY W/ URETERAL STENT REMOVAL Left 10/09/2014   Procedure: CYSTOSCOPY WITH STENT REMOVAL;  Surgeon: Hollice Espy, MD;  Location: [facility] ORS;  Service: Urology;  Laterality: Left;  . CYSTOSCOPY WITH STENT PLACEMENT Left 10/09/2014   Procedure: CYSTOSCOPY WITH STENT PLACEMENT;  Surgeon: Hollice Espy, MD;  Location: [facility] ORS;  Service: Urology;  Laterality: Left;  . KIDNEY SURGERY  05/2013   s/p stent placement   . stents ureters Bilateral   . TONSILLECTOMY AND ADENOIDECTOMY  1959  . URETEROSCOPY WITH HOLMIUM LASER LITHOTRIPSY Left 10/09/2014   Procedure: URETEROSCOPY WITH HOLMIUM LASER LITHOTRIPSY;  Surgeon: Hollice Espy, MD;  Location: [facility] ORS;  Service: Urology;  Laterality: Left;    Current Outpatient Prescriptions  Medication Sig Dispense Refill  . acetaminophen (TYLENOL) 650 MG CR tablet Take 650 mg by mouth every 8 (eight) hours as  needed.      Marland Kitchen albuterol (2.5 MG/3ML) 0.083% NEBU 3 mL, albuterol (5 MG/ML) 0.5% NEBU 0.5 mL Inhale 1 mg into the lungs.    . ALPRAZolam (XANAX) 0.25 MG tablet Take 1 tablet (0.25 mg total) by mouth at bedtime as needed for anxiety. 90 tablet 0  . atorvastatin (LIPITOR) 40 MG tablet TAKE 1 TABLET DAILY 90 tablet 3  . BD PEN NEEDLE NANO U/F 32G X 4 MM MISC USE THREE TIMES A DAY FOR INSULIN ADMINISTRATION 270 each 1  . bimatoprost (LUMIGAN) 0.01 % SOLN Place 1 drop into both eyes at bedtime.    . budesonide-formoterol (SYMBICORT) 160-4.5 MCG/ACT inhaler Inhale 2 puffs into the lungs 2 (two) times daily. 3 Inhaler 3  . citalopram (CELEXA) 10 MG tablet TAKE 1 TABLET DAILY 90 tablet 1  . ELIQUIS 5 MG TABS tablet TAKE 1 TABLET TWICE A DAY 180 tablet 3  . ezetimibe (ZETIA) 10 MG tablet Take 1 tablet by mouth daily 90 tablet 0  . finasteride (PROSCAR) 5 MG tablet Take 5 mg by mouth daily.    . fluticasone (FLONASE) 50 MCG/ACT nasal spray Place into both nostrils daily.    Marland Kitchen gabapentin (NEURONTIN) 100 MG capsule TAKE 1 CAPSULE THREE TIMES A DAY 270 capsule 1  . glipiZIDE (GLUCOTROL) 10 MG tablet TAKE 1 TABLET TWICE A DAY BEFORE MEALS 180 tablet 1  . HUMALOG [facility]  100 UNIT/ML KiwkPen INJECT 18 UNITS WITH BREAKFAST, 20 UNITS WITH LUNCH, AND 16 UNITS WITH DINNER 60 mL 0  . Insulin Detemir (LEVEMIR FLEXPEN) 100 UNIT/ML Pen Inject 28 Units into the skin daily at 10 pm. (Patient taking differently: Inject 42 Units into the skin daily at 10 pm. ) 10 pen 1  . Insulin Syringe-Needle U-100 30G X 1/2" 1 ML MISC 1 each by Does not apply route 3 (three) times daily. 200 each 5  . metolazone (ZAROXOLYN) 5 MG tablet TAKE 1 TABLET DAILY AS NEEDED 90 tablet 2  . metoprolol succinate (TOPROL-XL) 50 MG 24 hr tablet TAKE 1 TABLET TWICE A DAY 180 tablet 1  . nitroGLYCERIN (NITROSTAT) 0.4 MG SL tablet Place 0.4 mg under the tongue every 5 (five) minutes as needed.      . nystatin-triamcinolone (MYCOLOG II) cream Apply 1  application topically 2 (two) times daily.     . Omega-3 Fatty Acids (FISH OIL) 1000 MG CAPS Take 1 capsule by mouth 3 (three) times daily.     . potassium chloride (K-DUR) 10 MEQ tablet TAKE 3 TABLETS DAILY 270 tablet 3  . PROAIR HFA 108 (90 Base) MCG/ACT inhaler USE 1 TO 2 INHALATIONS EVERY 4 HOURS AS NEEDED 25.5 g 4  . sitaGLIPtin (JANUVIA) 100 MG tablet Take 1 tablet (100 mg total) by mouth daily. 90 tablet 1  . SPIRIVA HANDIHALER 18 MCG inhalation capsule INHALE THE CONTENTS OF 1 CAPSULE DAILY 90 capsule 3  . tamsulosin (FLOMAX) 0.4 MG CAPS capsule TAKE 1 CAPSULE DAILY 90 capsule 1  . torsemide (DEMADEX) 20 MG tablet Take 60 mg by mouth daily.      No current facility-administered medications for this visit.      Allergies:   Contrast media [iodinated diagnostic agents]; Morphine and related; and Niacin and related   Social History:  The patient  reports that he quit smoking about 25 years ago. His smoking use included Cigarettes. He has a 80.00 pack-year smoking history. He quit smokeless tobacco use about 25 years ago. He reports that he does not drink alcohol or use drugs.   Family History:   family history includes Diabetes in his maternal grandmother; Heart disease in his maternal grandmother and mother.    Review of Systems: Review of Systems  Constitutional: Negative.   Respiratory: Positive for shortness of breath.   Cardiovascular: Negative.   Gastrointestinal: Negative.   Musculoskeletal: Negative.        Gait instability  Neurological: Negative.   Psychiatric/Behavioral: Negative.   All other systems reviewed and are negative.    PHYSICAL EXAM: VS:  BP 112/78 (BP Location: Left Arm, Patient Position: Sitting, Cuff Size: Normal)   Pulse 70   Ht '5\' 6"'$  (1.676 m)   Wt 191 lb 12 oz (87 kg)   BMI 30.95 kg/m  , BMI Body mass index is 30.95 kg/m. GEN: Well nourished, well developed, in no acute distress  HEENT: normal  Neck: no JVD, carotid bruits, or  masses CardiaIrregularly irregular, no murmurs, rubs, or gallops,no edema  RespirMildly decreased breath sounds throughout, normal work of breathing  GI: soft, nontender, nondistended, + BS MS: no deformity or atrophy  Skin: warm and dry, no rash Neuro:  Strength and sensation are intact Psych: euthymic mood, full affect    Recent Labs: 08/05/2015: ALT 13; BUN 25; Creatinine, Ser 1.38; Hemoglobin 14.5; Platelets 240.0; Potassium 3.6; Sodium 139    Lipid Panel Lab Results  Component Value Date   CHOL  120 08/05/2015   HDL 32.10 (L) 08/05/2015   LDLCALC 47 10/04/2014   TRIG 208.0 (H) 08/05/2015      Wt Readings from Last 3 Encounters:  01/05/16 191 lb 12 oz (87 kg)  12/19/15 194 lb 9.6 oz (88.3 kg)  12/19/15 194 lb (88 kg)       ASSESSMENT AND PLAN:  Chronic atrial fibrillation (HCC) - Plan: EKG 12-Lead Heart rate relatively well controlled, tolerating anticoagulation No changes to his medications  Hyperlipidemia Cholesterol is at goal on the current lipid regimen. No changes to the medications were made.  Essential hypertension Blood pressure is well controlled on today's visit. No changes made to the medications.  Coronary artery disease involving native coronary artery of native heart without angina pectoris Currently with no symptoms of angina. No further workup at this time. Continue current medication regimen.  Chronic diastolic CHF (congestive heart failure) (HCC) Weight down to his baseline, doing well Continue torsemide twice a day  Controlled type 2 diabetes mellitus with other circulatory complication, with long-term current use of insulin (HCC) Dramatic improvement in A1c, weight loss Complemented him on his dramatic improvement  Chronic obstructive pulmonary disease, unspecified COPD type ([place]) Doing well off prednisone, on Symbicort Wife feels he is at his baseline   Total encounter time more than 25 minutes  Greater than 50% was spent in  counseling and coordination of care with the patient   Disposition:   F/U  6 months   Orders Placed This Encounter  Procedures  . EKG 12-Lead     Signed, Esmond Plants, M.D., Ph.D. 01/05/2016  [place], [place]

## 2016-01-06 ENCOUNTER — Other Ambulatory Visit: Payer: Self-pay | Admitting: Internal Medicine

## 2016-01-12 ENCOUNTER — Other Ambulatory Visit: Payer: Self-pay | Admitting: Internal Medicine

## 2016-01-12 ENCOUNTER — Telehealth: Payer: Self-pay | Admitting: Internal Medicine

## 2016-01-12 DIAGNOSIS — E876 Hypokalemia: Secondary | ICD-10-CM

## 2016-01-12 NOTE — Telephone Encounter (Signed)
Ordered

## 2016-01-12 NOTE — Telephone Encounter (Signed)
Spouse called pt went to see dr Gwenyth Ober cardology.  He wanted pt to get potassium check and wanted him to have pcp order.  Need order please

## 2016-01-16 ENCOUNTER — Other Ambulatory Visit: Payer: Medicare Other

## 2016-01-16 ENCOUNTER — Ambulatory Visit: Payer: Medicare Other

## 2016-01-17 ENCOUNTER — Other Ambulatory Visit: Payer: Self-pay | Admitting: Family Medicine

## 2016-01-19 ENCOUNTER — Other Ambulatory Visit (INDEPENDENT_AMBULATORY_CARE_PROVIDER_SITE_OTHER): Payer: Medicare Other

## 2016-01-19 ENCOUNTER — Ambulatory Visit (INDEPENDENT_AMBULATORY_CARE_PROVIDER_SITE_OTHER): Payer: Medicare Other

## 2016-01-19 ENCOUNTER — Other Ambulatory Visit: Payer: Self-pay | Admitting: Internal Medicine

## 2016-01-19 DIAGNOSIS — Z23 Encounter for immunization: Secondary | ICD-10-CM

## 2016-01-19 DIAGNOSIS — E876 Hypokalemia: Secondary | ICD-10-CM

## 2016-01-19 LAB — POTASSIUM: POTASSIUM: 3.2 meq/L — AB (ref 3.5–5.1)

## 2016-01-20 ENCOUNTER — Other Ambulatory Visit: Payer: Medicare Other

## 2016-01-22 ENCOUNTER — Telehealth: Payer: Self-pay | Admitting: Cardiovascular Disease

## 2016-01-22 MED ORDER — POTASSIUM CHLORIDE ER 20 MEQ PO TBCR: 40.0000 meq | EXTENDED_RELEASE_TABLET | Freq: Every day | ORAL | 6 refills | Status: DC

## 2016-01-22 NOTE — Telephone Encounter (Signed)
Spoke w/ pt.  He reports that he takes his potassium pills as directed: 3-10 meq tabs each day. Pt denies diarrhea recently. Advised him to take 60 meq for the next 2-3 days, then we are increasing his dosage to 40 meq daily. Sent in new rx for 20 meq tabs, so he will take 2 tabs daily. He is appreciative and will call back w/ any further questions or concerns.

## 2016-01-22 NOTE — Telephone Encounter (Signed)
Pt wife called, states pt had potassium checked yesterday at PCP. PCP told pt potassium is low. Please call and advise what pt should do, he is not feeling well. States "doesnt feel like going" this morning.

## 2016-01-22 NOTE — Telephone Encounter (Signed)
Notes Recorded by Jearld Fenton, NP on 01/19/2016 at 6:06 PM EDT Call pt:  Your potassium is low. He needs to call Dr. Rockey Situ and let him know.    Ref Range & Units 3d ago 58moago 620mogo 1140moo   Potassium 3.5 - 5.1 mEq/L 3.2

## 2016-01-26 ENCOUNTER — Other Ambulatory Visit: Payer: Self-pay | Admitting: Cardiovascular Disease

## 2016-01-26 MED ORDER — POTASSIUM CHLORIDE ER 20 MEQ PO TBCR: 40.0000 meq | EXTENDED_RELEASE_TABLET | Freq: Every day | ORAL | 3 refills | Status: DC

## 2016-01-26 NOTE — Addendum Note (Signed)
Addended by: Anselm Pancoast on: 01/26/2016 12:58 PM   Modules accepted: Orders

## 2016-01-26 NOTE — Telephone Encounter (Signed)
*  STAT* If patient is at the pharmacy, call can be transferred to refill team.   1. Which medications need to be refilled? (please list name of each medication and dose if known) potassium chloride 20 MEQ TBCR  2. Which pharmacy/location (including street and city if local pharmacy) is medication to be sent to?[facility]  3. Do they need a 30 day or 90 day supply? 90 day

## 2016-01-30 ENCOUNTER — Telehealth: Payer: Self-pay | Admitting: Cardiovascular Disease

## 2016-01-30 ENCOUNTER — Other Ambulatory Visit: Payer: Self-pay

## 2016-01-30 MED ORDER — POTASSIUM CHLORIDE ER 20 MEQ PO TBCR: 40.0000 meq | EXTENDED_RELEASE_TABLET | Freq: Every day | ORAL | 3 refills | Status: DC

## 2016-01-30 NOTE — Telephone Encounter (Signed)
Potassium Chloride ER 20 MEG take 40 meq increase to 90 day not 45 day   *STAT* If patient is at the pharmacy, call can be transferred to refill team.   1. Which medications need to be refilled? (please list name of each medication and dose if known)   Potassium Chloride ER 20 MEG take 40 meq  2. Which pharmacy/location (including street and city if local pharmacy) is medication to be sent to?   [facility]   3. Do they need a 30 day or 90 day supply? increase to 90 day not 45 day

## 2016-01-30 NOTE — Telephone Encounter (Signed)
Please see previous phone note.  New rx w/ adequate quantity sent in today.

## 2016-01-30 NOTE — Telephone Encounter (Signed)
Please review with patient for refill; it is not clear to me what he is taking.

## 2016-02-08 ENCOUNTER — Other Ambulatory Visit: Payer: Self-pay | Admitting: Family

## 2016-02-11 ENCOUNTER — Other Ambulatory Visit: Payer: Self-pay | Admitting: Family

## 2016-02-11 MED ORDER — TIOTROPIUM BROMIDE MONOHYDRATE 18 MCG IN CAPS: 1.0000 | ORAL_CAPSULE | Freq: Every day | RESPIRATORY_TRACT | 3 refills | Status: DC

## 2016-02-26 ENCOUNTER — Ambulatory Visit (INDEPENDENT_AMBULATORY_CARE_PROVIDER_SITE_OTHER): Payer: Medicare Other | Admitting: Internal Medicine

## 2016-02-26 ENCOUNTER — Encounter: Payer: Self-pay | Admitting: Internal Medicine

## 2016-02-26 VITALS — BP 120/70 | HR 73 | Temp 98.2°F | Wt 194.0 lb

## 2016-02-26 DIAGNOSIS — J441 Chronic obstructive pulmonary disease with (acute) exacerbation: Secondary | ICD-10-CM | POA: Diagnosis not present

## 2016-02-26 MED ORDER — AZITHROMYCIN 250 MG PO TABS: ORAL_TABLET | ORAL | 0 refills | Status: DC

## 2016-02-26 MED ORDER — HYDROCODONE-HOMATROPINE 5-1.5 MG/5ML PO SYRP: 5.0000 mL | ORAL_SOLUTION | Freq: Three times a day (TID) | ORAL | 0 refills | Status: DC | PRN

## 2016-02-26 NOTE — Progress Notes (Signed)
HPI  Pt presents to the clinic today with c/o runny nose, cough and chest congestion. This started 1 week ago. He is blowing clear mucous out of his nose. The cough is productive of yellow mucous. He has been wheezing and more short of breath than usual. He denies fever, chills or body aches. He has a history of COPD. He takes Spiriva and Symbicort as prescribed. He has had sick contacts.  Review of Systems        Past Medical History:  Diagnosis Date  . Arthritis   . CAD    a. MI 01/29/1996 tx'd w/ TPA @ [facility]; b. Myoview 06/2005: EF 50%, scar @ apex, mild peri-infarct ischemia  . Cancer ([place])    skin  . Chronic atrial fibrillation ([place])    a. since 2006; b. on warfarin  . Chronic diastolic CHF (congestive heart failure) ([place])    a. echo 04/2006: EF lower limits of nl, mod LVH, mild aortic root dilatation, & mild MR, biatrial enlargement; b. echo 04/2013: EF 60%, mod dilated LA, mild MR & TR, mod pulm HTN w/ RV systolic pressure 53, c. echo 04/21/14: EF 55-60%, unable to exclude WMA, severely dilated LA 6.6 cm, nl RVSP, mildly dilated aortic root  . COPD (chronic obstructive pulmonary disease) ([place])   . CVA 7616,0737   x2  . DM   . Falls   . GERD (gastroesophageal reflux disease)   . History of kidney stones   . HYPERLIPIDEMIA   . HYPERTENSION   . Kidney stone    a. s/p left ureteral stenting 04/24/14  . Neuropathy of both feet   . Poor balance     Family History  Problem Relation Age of Onset  . Heart disease Mother   . Heart disease Maternal Grandmother   . Diabetes Maternal Grandmother   . Cancer Neg Hx   . Stroke Neg Hx     Social History   Social History  . Marital status: Married    Spouse name: N/A  . Number of children: N/A  . Years of education: N/A   Occupational History  . Not on file.   Social History Main Topics  . Smoking status: Former Smoker    Packs/day: 2.00    Years: 40.00    Types: Cigarettes    Quit date: 05/24/1990  . Smokeless tobacco:  Former Systems developer    Quit date: 05/24/1990  . Alcohol use No  . Drug use: No  . Sexual activity: Not Currently   Other Topics Concern  . Not on file   Social History Narrative  . No narrative on file    Allergies  Allergen Reactions  . Contrast Media [Iodinated Diagnostic Agents] Shortness Of Breath  . Morphine And Related Other (See Comments)    Hallucinations   . Niacin And Related Dermatitis     Constitutional: Positive fatigue. Denies headache, fever or abrupt weight changes.  HEENT:  Positive runny nose. Denies eye redness, eye pain, pressure behind the eyes, facial pain, nasal congestion, ear pain, ringing in the ears, wax buildup, or sore throat. Respiratory: Positive cough and shortness of breath. Denies difficulty breathing.  Cardiovascular: Denies chest pain, chest tightness, palpitations or swelling in the hands or feet.   No other specific complaints in a complete review of systems (except as listed in HPI above).  Objective:   BP 120/70   Pulse 73   Temp 98.2 F (36.8 C) (Oral)   Wt 194 lb (88 kg)  SpO2 96%   BMI 31.31 kg/m   Wt Readings from Last 3 Encounters:  02/26/16 194 lb (88 kg)  01/05/16 191 lb 12 oz (87 kg)  12/19/15 194 lb 9.6 oz (88.3 kg)     General: Appears his stated age, chronically ill appearing, in NAD. HEENT: Head: normal shape and size, no sinus tenderness noted; Eyes: sclera white, no icterus, conjunctiva pink; Ears: Tm's gray and intact, normal light reflex; Nose: mucosa pink and moist, septum midline; Throat/Mouth: + PND. Teeth present, mucosa pink and moist, no exudate noted, no lesions or ulcerations noted.  Neck: No cervical lymphadenopathy.  Pulmonary/Chest: Normal effort. Intermittent inspiratory wheeze with coarse crackles in the RLL. No respiratory distress.       Assessment & Plan:   COPD exacerbation:  Get some rest and drink plenty of water eRx for Azithromax x 5 days eRx for Hycodan cough syrup  RTC as needed or if  symptoms persist.   Webb Silversmith, NP

## 2016-02-26 NOTE — Patient Instructions (Signed)
Chronic Obstructive Pulmonary Disease Exacerbation  Chronic obstructive pulmonary disease (COPD) is a common lung problem. In COPD, the flow of air from the lungs is limited. COPD exacerbations are times that breathing gets worse and you need extra treatment. Without treatment they can be life threatening. If they happen often, your lungs can become more damaged. If your COPD gets worse, your doctor may treat you with:  ? Medicines.  ? Oxygen.  ? Different ways to clear your airway, such as using a mask.    Follow these instructions at home:  ? Do not smoke.  ? Avoid tobacco smoke and other things that bother your lungs.  ? If given, take your antibiotic medicine as told. Finish the medicine even if you start to feel better.  ? Only take medicines as told by your doctor.  ? Drink enough fluids to keep your pee (urine) clear or pale yellow (unless your doctor has told you not to).  ? Use a cool mist machine (vaporizer).  ? If you use oxygen or a machine that turns liquid medicine into a mist (nebulizer), continue to use them as told.  ? Keep up with shots (vaccinations) as told by your doctor.  ? Exercise regularly.  ? Eat healthy foods.  ? Keep all doctor visits as told.  Get help right away if:  ? You are very short of breath and it gets worse.  ? You have trouble talking.  ? You have bad chest pain.  ? You have blood in your spit (sputum).  ? You have a fever.  ? You keep throwing up (vomiting).  ? You feel weak, or you pass out (faint).  ? You feel confused.  ? You keep getting worse.  This information is not intended to replace advice given to you by your health care provider. Make sure you discuss any questions you have with your health care provider.  Document Released: 03/18/2011 Document Revised: 09/04/2015 Document Reviewed: 12/01/2012  Elsevier Interactive Patient Education ? 2017 [employer]

## 2016-03-05 ENCOUNTER — Other Ambulatory Visit: Payer: Self-pay | Admitting: Internal Medicine

## 2016-03-19 ENCOUNTER — Encounter: Payer: Self-pay | Admitting: Family

## 2016-03-19 ENCOUNTER — Ambulatory Visit: Payer: Medicare Other | Attending: Family | Admitting: Family

## 2016-03-19 VITALS — BP 135/57 | HR 57 | Resp 18 | Ht 66.0 in | Wt 189.0 lb

## 2016-03-19 DIAGNOSIS — I5032 Chronic diastolic (congestive) heart failure: Secondary | ICD-10-CM | POA: Insufficient documentation

## 2016-03-19 DIAGNOSIS — I10 Essential (primary) hypertension: Secondary | ICD-10-CM

## 2016-03-19 DIAGNOSIS — K219 Gastro-esophageal reflux disease without esophagitis: Secondary | ICD-10-CM | POA: Diagnosis not present

## 2016-03-19 DIAGNOSIS — R296 Repeated falls: Secondary | ICD-10-CM | POA: Insufficient documentation

## 2016-03-19 DIAGNOSIS — Z87891 Personal history of nicotine dependence: Secondary | ICD-10-CM | POA: Insufficient documentation

## 2016-03-19 DIAGNOSIS — Z8673 Personal history of transient ischemic attack (TIA), and cerebral infarction without residual deficits: Secondary | ICD-10-CM | POA: Insufficient documentation

## 2016-03-19 DIAGNOSIS — I2510 Atherosclerotic heart disease of native coronary artery without angina pectoris: Secondary | ICD-10-CM | POA: Diagnosis not present

## 2016-03-19 DIAGNOSIS — E1159 Type 2 diabetes mellitus with other circulatory complications: Secondary | ICD-10-CM

## 2016-03-19 DIAGNOSIS — Z794 Long term (current) use of insulin: Secondary | ICD-10-CM | POA: Diagnosis not present

## 2016-03-19 DIAGNOSIS — J449 Chronic obstructive pulmonary disease, unspecified: Secondary | ICD-10-CM | POA: Diagnosis not present

## 2016-03-19 DIAGNOSIS — Z79899 Other long term (current) drug therapy: Secondary | ICD-10-CM | POA: Insufficient documentation

## 2016-03-19 DIAGNOSIS — E119 Type 2 diabetes mellitus without complications: Secondary | ICD-10-CM | POA: Diagnosis not present

## 2016-03-19 DIAGNOSIS — E785 Hyperlipidemia, unspecified: Secondary | ICD-10-CM | POA: Insufficient documentation

## 2016-03-19 DIAGNOSIS — I252 Old myocardial infarction: Secondary | ICD-10-CM | POA: Diagnosis not present

## 2016-03-19 DIAGNOSIS — M1990 Unspecified osteoarthritis, unspecified site: Secondary | ICD-10-CM | POA: Diagnosis not present

## 2016-03-19 DIAGNOSIS — I110 Hypertensive heart disease with heart failure: Secondary | ICD-10-CM | POA: Diagnosis present

## 2016-03-19 NOTE — Progress Notes (Signed)
Patient ID: Shawn Grace., male    DOB: [birth year], 82 y.o.   MRN: [id number]  HPI  Mr [name] is a 82 y/o male with a history of frequent falls, kidney stone with ureteral stenting, HTN, hyperlipidemia, GERD, DM, CVA, COPD, atrial fibrillation, CAD/MI, arthritis, remote tobacco use and chronic heart failure.  Last echo was done 05/03/14 and showed an EF of 60-65%. This is a slight improvement from echo done on 01/25/14 which showed an EF of 55-60% along with mild TR. PA pressure elevated of 53 mm Hg consistent with moderate pulmonary HTN.   Was last admitted on 10/09/14 with sepsis, atrial fibrillation and pulmonary fibrosis. Was seen by urology and treated with antibiotics as he has just had lithotripsy and ureteral stent placed that same day. Was discharged after 3 days. Was last in the ED on 09/13/14 due to contusion of his left upper arm after a mechanical fall.   Presents today for a follow-up visit with fatigue and shortness of breath with exertion. He also has a chronic amount of edema in bilateral lower legs although he continues to wear his support socks. Does have some pain in his tailbone as he had a recent fall a couple of weeks ago.   Past Medical History:  Diagnosis Date  . Arthritis   . CAD    a. MI 01/29/1996 tx'd w/ TPA @ [facility]; b. Myoview 06/2005: EF 50%, scar @ apex, mild peri-infarct ischemia  . Cancer ([place])    skin  . Chronic atrial fibrillation ([place])    a. since 2006; b. on warfarin  . Chronic diastolic CHF (congestive heart failure) ([place])    a. echo 04/2006: EF lower limits of nl, mod LVH, mild aortic root dilatation, & mild MR, biatrial enlargement; b. echo 04/2013: EF 60%, mod dilated LA, mild MR & TR, mod pulm HTN w/ RV systolic pressure 53, c. echo 04/21/14: EF 55-60%, unable to exclude WMA, severely dilated LA 6.6 cm, nl RVSP, mildly dilated aortic root  . COPD (chronic obstructive pulmonary disease) ([place])   . CVA [phone]   x2  . DM   . Falls   . GERD  (gastroesophageal reflux disease)   . History of kidney stones   . HYPERLIPIDEMIA   . HYPERTENSION   . Kidney stone    a. s/p left ureteral stenting 04/24/14  . Neuropathy of both feet   . Poor balance     Past Surgical History:  Procedure Laterality Date  . BLADDER SURGERY     stent placement   . CARDIAC CATHETERIZATION  1997   [facility]  . CAROTID STENT INSERTION  1997  . CIRCUMCISION  2016  . CORONARY ANGIOPLASTY  1997   s/p stent placement x 2   . CYSTOSCOPY W/ URETERAL STENT REMOVAL Left 10/09/2014   Procedure: CYSTOSCOPY WITH STENT REMOVAL;  Surgeon: Hollice Espy, MD;  Location: [facility] OR[facility];  Service: Urology;  Laterality: Left;  . CYSTOSCOPY WITH STENT PLACEMENT Left 10/09/2014   Procedure: CYSTOSCOPY WITH STENT PLACEMENT;  Surgeon: Hollice Espy, MD;  Location: [facility] OR[facility];  Service: Urology;  Laterality: Left;  . KIDNEY SURGERY  05/2013   s/p stent placement   . stents ureters Bilateral   . TONSILLECTOMY AND ADENOIDECTOMY  1959  . URETEROSCOPY WITH HOLMIUM LASER LITHOTRIPSY Left 10/09/2014   Procedure: URETEROSCOPY WITH HOLMIUM LASER LITHOTRIPSY;  Surgeon: Hollice Espy, MD;  Location: [facility] OR[facility];  Service: Urology;  Laterality: Left;    Family History  Problem Relation Age  of Onset  . Heart disease Mother   . Heart disease Maternal Grandmother   . Diabetes Maternal Grandmother   . Cancer Neg Hx   . Stroke Neg Hx     Social History  Substance Use Topics  . Smoking status: Former Smoker    Packs/day: 2.00    Years: 40.00    Types: Cigarettes    Quit date: 05/24/1990  . Smokeless tobacco: Former Systems developer    Quit date: 05/24/1990  . Alcohol use No    Allergies  Allergen Reactions  . Contrast Media [Iodinated Diagnostic Agents] Shortness Of Breath  . Morphine And Related Other (See Comments)    Hallucinations   . Niacin And Related Dermatitis    Prior to Admission medications   Medication Sig Start Date End Date Taking? Authorizing Provider  acetaminophen (TYLENOL)  650 MG CR tablet Take 650 mg by mouth every 8 (eight) hours as needed.     Yes Historical Provider, MD  albuterol (2.5 MG/3ML) 0.083% NEBU 3 mL, albuterol (5 MG/ML) 0.5% NEBU 0.5 mL Inhale 1 mg into the lungs.   Yes Historical Provider, MD  ALPRAZolam (XANAX) 0.25 MG tablet Take 1 tablet (0.25 mg total) by mouth at bedtime as needed for anxiety. 11/05/14  Yes Jearld Fenton, NP  atorvastatin (LIPITOR) 40 MG tablet TAKE 1 TABLET DAILY 10/21/15  Yes Alisa Graff, FNP  BD PEN NEEDLE NANO U/F 32G X 4 MM MISC USE THREE TIMES A DAY FOR INSULIN ADMINISTRATION 06/23/15  Yes Jearld Fenton, NP  bimatoprost (LUMIGAN) 0.01 % SOLN Place 1 drop into both eyes at bedtime.   Yes Historical Provider, MD  budesonide-formoterol (SYMBICORT) 160-4.5 MCG/ACT inhaler Inhale 2 puffs into the lungs 2 (two) times daily. 10/20/15  Yes Wilhelmina Mcardle, MD  citalopram (CELEXA) 10 MG tablet TAKE 1 TABLET DAILY 09/24/15  Yes Jearld Fenton, NP  ELIQUIS 5 MG TABS tablet TAKE 1 TABLET TWICE A DAY 06/25/15  Yes Minna Merritts, MD  ezetimibe (ZETIA) 10 MG tablet TAKE 1 TABLET DAILY 01/19/16  Yes Jearld Fenton, NP  finasteride (PROSCAR) 5 MG tablet TAKE 1 TABLET DAILY 03/10/16  Yes Jearld Fenton, NP  fluticasone (FLONASE) 50 MCG/ACT nasal spray Place into both nostrils daily.   Yes Historical Provider, MD  gabapentin (NEURONTIN) 100 MG capsule TAKE 1 CAPSULE THREE TIMES A DAY 12/05/15  Yes Jearld Fenton, NP  glipiZIDE (GLUCOTROL) 10 MG tablet TAKE 1 TABLET TWICE A DAY BEFORE MEALS 01/07/16  Yes Jearld Fenton, NP  HUMALOG KWIKPEN 100 UNIT/ML KiwkPen INJECT 18 UNITS WITH BREAKFAST, 20 UNITS WITH LUNCH, AND 16 UNITS WITH DINNER 03/10/16  Yes Jearld Fenton, NP  HYDROcodone-homatropine [facility]) 5-1.5 MG/5ML syrup Take 5 mLs by mouth every 8 (eight) hours as needed for cough. 02/26/16  Yes Jearld Fenton, NP  Insulin Detemir (LEVEMIR FLEXPEN) 100 UNIT/ML Pen Inject 42 Units into the skin daily at 10 pm.   Yes Historical Provider, MD   Insulin Syringe-Needle U-100 30G X 1/2" 1 ML MISC 1 each by Does not apply route 3 (three) times daily. 07/19/14  Yes Jearld Fenton, NP  metolazone (ZAROXOLYN) 5 MG tablet TAKE 1 TABLET DAILY AS NEEDED 12/17/15  Yes Minna Merritts, MD  metoprolol succinate (TOPROL-XL) 50 MG 24 hr tablet TAKE 1 TABLET TWICE A DAY 01/19/16  Yes Abner Greenspan, MD  nitroGLYCERIN (NITROSTAT) 0.4 MG SL tablet Place 0.4 mg under the tongue every 5 (five) minutes  as needed.     Yes Historical Provider, MD  nystatin-triamcinolone (MYCOLOG II) cream Apply 1 application topically 2 (two) times daily.  03/12/14  Yes Historical Provider, MD  Omega-3 Fatty Acids (FISH OIL) 1000 MG CAPS Take 1 capsule by mouth 3 (three) times daily.    Yes Historical Provider, MD  Potassium Chloride ER 20 MEQ TBCR Take 40 mEq by mouth daily. 01/30/16  Yes Minna Merritts, MD  PROAIR HFA 108 [id number] Base) MCG/ACT inhaler USE 1 TO 2 INHALATIONS EVERY 4 HOURS AS NEEDED 01/02/16  Yes Alisa Graff, FNP  sitaGLIPtin (JANUVIA) 100 MG tablet Take 1 tablet (100 mg total) by mouth daily. 12/03/15  Yes Jearld Fenton, NP  tamsulosin (FLOMAX) 0.4 MG CAPS capsule TAKE 1 CAPSULE DAILY 10/20/15  Yes Jearld Fenton, NP  tiotropium (SPIRIVA HANDIHALER) 18 MCG inhalation capsule Place 1 capsule (18 mcg total) into inhaler and inhale daily. 02/11/16  Yes Alisa Graff, FNP  torsemide (DEMADEX) 20 MG tablet Take 60 mg by mouth daily.    Yes Historical Provider, MD    Review of Systems  Constitutional: Positive for fatigue. Negative for appetite change.  HENT: Negative for congestion, postnasal drip and sore throat.   Eyes: Negative.   Respiratory: Positive for shortness of breath. Negative for chest tightness.   Cardiovascular: Positive for leg swelling. Negative for chest pain and palpitations.  Gastrointestinal: Negative for abdominal distention and abdominal pain.  Endocrine: Negative.   Genitourinary: Negative.   Musculoskeletal: Positive for back pain  (tailbone). Negative for arthralgias.  Skin: Negative.   Allergic/Immunologic: Negative.   Neurological: Positive for light-headedness (off balance). Negative for dizziness.  Hematological: Negative for adenopathy.  Psychiatric/Behavioral: Positive for sleep disturbance (not sleeping well; using CPAP nightly). Negative for dysphoric mood. The patient is not nervous/anxious.    Vitals:   03/19/16 1058  BP: (!) 135/57  Pulse: (!) 57  Resp: 18  SpO2: 97%  Weight: 189 lb (85.7 kg)  Height: '5\' 6"'$  (1.676 m)   Wt Readings from Last 3 Encounters:  03/19/16 189 lb (85.7 kg)  02/26/16 194 lb (88 kg)  01/05/16 191 lb 12 oz (87 kg)   Lab Results  Component Value Date   CREATININE 1.38 08/05/2015   CREATININE 1.57 (H) 07/15/2015   CREATININE 1.55 (H) 02/21/2015   Physical Exam  Constitutional: He is oriented to person, place, and time. He appears well-developed and well-nourished.  HENT:  Head: Normocephalic and atraumatic.  Eyes: Conjunctivae are normal. Pupils are equal, round, and reactive to light.  Neck: Normal range of motion. Neck supple. No JVD present.  Cardiovascular: An irregular rhythm present. Bradycardia present.   Pulmonary/Chest: Effort normal. He has no wheezes. He has no rales.  Abdominal: Soft. He exhibits no distension. There is no tenderness.  Musculoskeletal: He exhibits edema (2+ pitting edema in bilateral lower legs). He exhibits no tenderness.  Neurological: He is alert and oriented to person, place, and time.  Skin: Skin is warm and dry.  Psychiatric: He has a normal mood and affect. His behavior is normal. Thought content normal.  Vitals reviewed.  Assessment and Plan:  1: Chronic heart failure with preserved ejection fraction- - NYHA class III - mildly fluid overloaded today - continues to weigh daily, gradual weight loss. Down 5 pounds from last visit here September 2017. Reminded to call for an overnight weight gain of >2 pounds or a weekly weight gain  of >5 pounds - not adding salt to his food -  has not taken diuretic yet today but will do so after he returns home. Wearing support socks and tries to elevate them at home - did receive the flu vaccine for this season already  2: HTN- - BP looks good today - last saw PCP 02/26/16  3: DM- - glucose this morning was 166 as he says that he ate ice cream last night - reports morning glucose levels usually running around 120  4: Falls- - reports some mild tailbone pain when he walks/sits for long periods - fell backwards 03/01/16 as he had his hands fall and lost his balance - supportive treatment at this time  Return here in 6 months or sooner for any questions/problems before then.

## 2016-03-19 NOTE — Patient Instructions (Signed)
Continue weighing daily and call for an overnight weight gain of > 2 pounds or a weekly weight gain of >5 pounds. 

## 2016-03-22 ENCOUNTER — Other Ambulatory Visit: Payer: Self-pay | Admitting: Internal Medicine

## 2016-03-25 ENCOUNTER — Other Ambulatory Visit: Payer: Self-pay

## 2016-03-25 DIAGNOSIS — F419 Anxiety disorder, unspecified: Secondary | ICD-10-CM

## 2016-03-25 MED ORDER — ALPRAZOLAM 0.25 MG PO TABS: 0.2500 mg | ORAL_TABLET | Freq: Every evening | ORAL | 0 refills | Status: DC | PRN

## 2016-03-25 NOTE — Telephone Encounter (Signed)
Pt left v/m requesting [facility] Last refilled # 90 on 11/05/14. Last seen 08/05/15 for f/u. Please advise.

## 2016-03-25 NOTE — Telephone Encounter (Signed)
RX printed and signed and given to MYD

## 2016-03-25 NOTE — Telephone Encounter (Signed)
Rx faxed to mail order 

## 2016-04-18 ENCOUNTER — Other Ambulatory Visit: Payer: Self-pay | Admitting: Internal Medicine

## 2016-05-10 ENCOUNTER — Other Ambulatory Visit: Payer: Self-pay | Admitting: Internal Medicine

## 2016-05-13 ENCOUNTER — Telehealth: Payer: Self-pay

## 2016-05-13 ENCOUNTER — Ambulatory Visit (INDEPENDENT_AMBULATORY_CARE_PROVIDER_SITE_OTHER): Payer: Medicare Other | Admitting: Family Medicine

## 2016-05-13 ENCOUNTER — Telehealth: Payer: Self-pay | Admitting: Radiology

## 2016-05-13 ENCOUNTER — Encounter: Payer: Self-pay | Admitting: Family Medicine

## 2016-05-13 VITALS — BP 104/70 | HR 70 | Temp 97.7°F | Wt 185.0 lb

## 2016-05-13 DIAGNOSIS — I5032 Chronic diastolic (congestive) heart failure: Secondary | ICD-10-CM

## 2016-05-13 DIAGNOSIS — I482 Chronic atrial fibrillation, unspecified: Secondary | ICD-10-CM

## 2016-05-13 DIAGNOSIS — R42 Dizziness and giddiness: Secondary | ICD-10-CM | POA: Diagnosis not present

## 2016-05-13 DIAGNOSIS — E1159 Type 2 diabetes mellitus with other circulatory complications: Secondary | ICD-10-CM

## 2016-05-13 DIAGNOSIS — D72829 Elevated white blood cell count, unspecified: Secondary | ICD-10-CM | POA: Insufficient documentation

## 2016-05-13 DIAGNOSIS — Z794 Long term (current) use of insulin: Secondary | ICD-10-CM | POA: Diagnosis not present

## 2016-05-13 LAB — HEMOGLOBIN A1C: Hgb A1c MFr Bld: 6.5 % (ref 4.6–6.5)

## 2016-05-13 LAB — CBC WITH DIFFERENTIAL/PLATELET
BASOS PCT: 0.5 % (ref 0.0–3.0)
Basophils Absolute: 0.1 10*3/uL (ref 0.0–0.1)
EOS PCT: 1.3 % (ref 0.0–5.0)
Eosinophils Absolute: 0.2 10*3/uL (ref 0.0–0.7)
HCT: 45.4 % (ref 39.0–52.0)
Hemoglobin: 15.4 g/dL (ref 13.0–17.0)
LYMPHS ABS: 3.8 10*3/uL (ref 0.7–4.0)
Lymphocytes Relative: 21.3 % (ref 12.0–46.0)
MCHC: 33.9 g/dL (ref 30.0–36.0)
MCV: 82.1 fl (ref 78.0–100.0)
MONOS PCT: 13.9 % — AB (ref 3.0–12.0)
Monocytes Absolute: 2.5 10*3/uL — ABNORMAL HIGH (ref 0.1–1.0)
NEUTROS PCT: 63 % (ref 43.0–77.0)
Neutro Abs: 11.4 10*3/uL — ABNORMAL HIGH (ref 1.4–7.7)
PLATELETS: 251 10*3/uL (ref 150.0–400.0)
RBC: 5.54 Mil/uL (ref 4.22–5.81)
RDW: 16.2 % — AB (ref 11.5–15.5)
WBC: 18 10*3/uL (ref 4.0–10.5)

## 2016-05-13 LAB — RENAL FUNCTION PANEL
Albumin: 3.9 g/dL (ref 3.5–5.2)
BUN: 58 mg/dL — ABNORMAL HIGH (ref 6–23)
CALCIUM: 9.9 mg/dL (ref 8.4–10.5)
CO2: 36 mEq/L — ABNORMAL HIGH (ref 19–32)
Chloride: 91 mEq/L — ABNORMAL LOW (ref 96–112)
Creatinine, Ser: 1.85 mg/dL — ABNORMAL HIGH (ref 0.40–1.50)
GFR: 37.35 mL/min — ABNORMAL LOW (ref >60.00)
GLUCOSE: 219 mg/dL — AB (ref 70–99)
Phosphorus: 3.6 mg/dL (ref 2.3–4.6)
Potassium: 3.1 mEq/L — ABNORMAL LOW (ref 3.5–5.1)
Sodium: 137 mEq/L (ref 135–145)

## 2016-05-13 LAB — TSH: TSH: 5.79 u[IU]/mL — ABNORMAL HIGH (ref 0.35–4.50)

## 2016-05-13 NOTE — Telephone Encounter (Signed)
noted 

## 2016-05-13 NOTE — Progress Notes (Signed)
Pre visit review using our clinic review tool, if applicable. No additional management support is needed unless otherwise documented below in the visit note. 

## 2016-05-13 NOTE — Assessment & Plan Note (Signed)
Marked dizziness after hist first metolazone '5mg'$  tablet dose he took on Sunday. Marked effect on urine output and weight drop. rec take 1/2 tablet if needed from here on out. Pt agrees with plan. Has #89 tablets left of '5mg'$  at home.

## 2016-05-13 NOTE — Assessment & Plan Note (Signed)
See below. Continue torsemide '60mg'$  daily. If needed, start with 1/2 tablet metolazone (has 89 pills at home).

## 2016-05-13 NOTE — Telephone Encounter (Signed)
See lab result note.  Looks like WBC has been staying elevated - will rec f/u with PCP for further evaluation and labs - periph smear etc.  Will cc Rollene Fare.

## 2016-05-13 NOTE — Telephone Encounter (Signed)
Mrs Marhefka was seeing Allie Bossier NP today and asked if someone would go out to parking lot and triage pt. On 05/10/16 pt took metolazone (was the first time pt had taken med given by Dr Rockey Situ) pt lost 10 lbs in 2 days. Pt is taking other diuretic as instructed. For 2 days pt has been staggering every time he gets up to walk; has slight h/a on and off and vision (can't focus good) has been worse for last 2 days. No CP or SOB.pt said he usually weighs 188-190 and today weighed 182.8 lbs.last night BP was 117/72 P 95.  Now BP 120/78 LA, temp 97.8 P 68 and pulse ox varied from 87-92 %. Pt said he has O2 at home that he uses occasionally. Avie Echevaria NP said could be dehydration and pt needs to be seen. Pt will see Dr Darnell Level 05/13/16 at 12:45. Mrs Broadus said she needed to go home to take med and would bring pt back; advised her to come in and we would help pt into w/c to be seen. Mrs Baines voiced understanding.

## 2016-05-13 NOTE — Patient Instructions (Addendum)
I'm glad you're feeling better. Symptoms likely came from metolazone '5mg'$ . If you need to take another for weight gain/swelling, only take 1/2 tablet.  Labs today.  Good to see you today, call us with questions.

## 2016-05-13 NOTE — Progress Notes (Signed)
BP 104/70 (BP Location: Left Arm, Patient Position: Sitting, Cuff Size: Normal)   Pulse 70   Temp 97.7 F (36.5 C) (Oral)   Wt 185 lb (83.9 kg)   SpO2 92%   BMI 29.86 kg/m    CC: headache, dizziness Subjective:    Patient ID: [name]., male    DOB: [birth year], 83 y.o.   MRN: [id number]  HPI: Yassine Brunsman. is a 83 y.o. male presenting on 05/13/2016 for Dizziness (Slight headache. Positional issue when getting up from sitting position. Cardiologist placed him on metolazone)   Known chronic diastolic CHF followed by cardiology, recently started on metolazone for chronic pedal edema (last seen 03/19/2016). Walks with walker. Chronically on torsemide '60mg'$  daily. First metolazone '5mg'$  dose was Sunday because weight increased to 192lbs along with swelling of arms and legs. Dropped 8 lbs overnight, next morning he had significant dizziness, staggering, and mild headache. Day after dropped another 3 lbs to 181lb. He did find that metolazone did increase voiding and was effective in fluid management. Never dyspnea or chest pain.   Brings weight log which was reviewed.  Uses CPAP at night.  Relevant past medical, surgical, family and social history reviewed and updated as indicated. Interim medical history since our last visit reviewed. Allergies and medications reviewed and updated. Current Outpatient Prescriptions on File Prior to Visit  Medication Sig  . acetaminophen (TYLENOL) 650 MG CR tablet Take 650 mg by mouth every 8 (eight) hours as needed.    Marland Kitchen albuterol (2.5 MG/3ML) 0.083% NEBU 3 mL, albuterol (5 MG/ML) 0.5% NEBU 0.5 mL Inhale 1 mg into the lungs.  . ALPRAZolam (XANAX) 0.25 MG tablet Take 1 tablet (0.25 mg total) by mouth at bedtime as needed for anxiety.  Marland Kitchen atorvastatin (LIPITOR) 40 MG tablet TAKE 1 TABLET DAILY  . BD PEN NEEDLE NANO U/F 32G X 4 MM MISC USE THREE TIMES A DAY FOR INSULIN ADMINISTRATION  . bimatoprost (LUMIGAN) 0.01 % SOLN Place 1 drop into both eyes  at bedtime.  . budesonide-formoterol (SYMBICORT) 160-4.5 MCG/ACT inhaler Inhale 2 puffs into the lungs 2 (two) times daily.  . citalopram (CELEXA) 10 MG tablet TAKE 1 TABLET DAILY  . ELIQUIS 5 MG TABS tablet TAKE 1 TABLET TWICE A DAY  . ezetimibe (ZETIA) 10 MG tablet TAKE 1 TABLET DAILY  . finasteride (PROSCAR) 5 MG tablet TAKE 1 TABLET DAILY  . fluticasone (FLONASE) 50 MCG/ACT nasal spray Place into both nostrils daily.  Marland Kitchen gabapentin (NEURONTIN) 100 MG capsule TAKE 1 CAPSULE THREE TIMES A DAY  . glipiZIDE (GLUCOTROL) 10 MG tablet TAKE 1 TABLET TWICE A DAY BEFORE MEALS  . HUMALOG KWIKPEN 100 UNIT/ML KiwkPen INJECT 18 UNITS WITH BREAKFAST, 20 UNITS WITH LUNCH, AND 16 UNITS WITH DINNER  . Insulin Detemir (LEVEMIR FLEXPEN) 100 UNIT/ML Pen Inject 42 Units into the skin daily at 10 pm.  . Insulin Syringe-Needle U-100 30G X 1/2" 1 ML MISC 1 each by Does not apply route 3 (three) times daily.  . metolazone (ZAROXOLYN) 5 MG tablet TAKE 1 TABLET DAILY AS NEEDED  . metoprolol succinate (TOPROL-XL) 50 MG 24 hr tablet TAKE 1 TABLET TWICE A DAY  . nitroGLYCERIN (NITROSTAT) 0.4 MG SL tablet Place 0.4 mg under the tongue every 5 (five) minutes as needed.    . nystatin-triamcinolone (MYCOLOG II) cream Apply 1 application topically 2 (two) times daily.   . Omega-3 Fatty Acids (FISH OIL) 1000 MG CAPS Take 1 capsule by mouth 3 (three)  times daily.   . Potassium Chloride ER 20 MEQ TBCR Take 40 mEq by mouth daily.  Marland Kitchen PROAIR HFA 108 (90 Base) MCG/ACT inhaler USE 1 TO 2 INHALATIONS EVERY 4 HOURS AS NEEDED  . sitaGLIPtin (JANUVIA) 100 MG tablet Take 1 tablet (100 mg total) by mouth daily.  . tamsulosin (FLOMAX) 0.4 MG CAPS capsule TAKE 1 CAPSULE DAILY  . tiotropium (SPIRIVA HANDIHALER) 18 MCG inhalation capsule Place 1 capsule (18 mcg total) into inhaler and inhale daily.  Marland Kitchen torsemide (DEMADEX) 20 MG tablet Take 60 mg by mouth daily.    No current facility-administered medications on file prior to visit.      Review of Systems Per HPI unless specifically indicated in ROS section     Objective:    BP 104/70 (BP Location: Left Arm, Patient Position: Sitting, Cuff Size: Normal)   Pulse 70   Temp 97.7 F (36.5 C) (Oral)   Wt 185 lb (83.9 kg)   SpO2 92%   BMI 29.86 kg/m   Wt Readings from Last 3 Encounters:  05/13/16 185 lb (83.9 kg)  03/19/16 189 lb (85.7 kg)  02/26/16 194 lb (88 kg)    Physical Exam  Constitutional: He appears well-developed and well-nourished. No distress.  HENT:  Mouth/Throat: Oropharynx is clear and moist. No oropharyngeal exudate.  Cardiovascular: Normal rate, normal heart sounds and intact distal pulses.  An irregularly irregular rhythm present.  No murmur heard. Pulmonary/Chest: Effort normal and breath sounds normal. No respiratory distress. He has no wheezes. He has no rales.  Musculoskeletal: He exhibits edema (tr).  Knee high compression socks on  Skin: Skin is warm and dry. No rash noted.  Nursing note and vitals reviewed.   Orthostatics negative today.   Results for orders placed or performed in visit on 01/19/16  Potassium  Result Value Ref Range   Potassium 3.2 (L) 3.5 - 5.1 mEq/L   Lab Results  Component Value Date   CREATININE 1.38 08/05/2015       Assessment & Plan:   Problem List Items Addressed This Visit    ATRIAL FIBRILLATION    Irregular. Continue eliquis.       Chronic diastolic CHF (congestive heart failure) ([place])    See below. Continue torsemide '60mg'$  daily. If needed, start with 1/2 tablet metolazone (has 89 pills at home).       Diabetes type 2, controlled ([place]) (Chronic)    Update A1c, will forward to PCP.       Relevant Orders   Hemoglobin A1c   Dizziness - Primary    Marked dizziness after hist first metolazone '5mg'$  tablet dose he took on Sunday. Marked effect on urine output and weight drop. rec take 1/2 tablet if needed from here on out. Pt agrees with plan. Has #89 tablets left of '5mg'$  at home.       Relevant  Orders   CBC with Differential/Platelet   Renal function panel   TSH       Follow up plan: Return if symptoms worsen or fail to improve.  Ria Bush, MD

## 2016-05-13 NOTE — Telephone Encounter (Signed)
[facility] lab call critical results, WBC - 18.0. Results given to Dr Danise Mina

## 2016-05-13 NOTE — Assessment & Plan Note (Signed)
Update A1c, will forward to PCP.

## 2016-05-13 NOTE — Telephone Encounter (Signed)
Seen today. 

## 2016-05-13 NOTE — Assessment & Plan Note (Signed)
Irregular. Continue eliquis.

## 2016-05-14 ENCOUNTER — Telehealth: Payer: Self-pay

## 2016-05-14 NOTE — Telephone Encounter (Signed)
Pt request review of lab results from yesterday;  Reviewed results with pt as instructed by Dr Darnell Level. Pt concerned about elevated WBC; advised could be infection, stress and lots of other factors; . Pt already has appt to f/u with PCP next week and pt will discuss with Avie Echevaria NP then.

## 2016-05-20 ENCOUNTER — Encounter: Payer: Self-pay | Admitting: Internal Medicine

## 2016-05-20 ENCOUNTER — Ambulatory Visit (INDEPENDENT_AMBULATORY_CARE_PROVIDER_SITE_OTHER): Payer: Medicare Other | Admitting: Internal Medicine

## 2016-05-20 VITALS — BP 108/62 | HR 64 | Temp 97.7°F | Wt 192.0 lb

## 2016-05-20 DIAGNOSIS — D72829 Elevated white blood cell count, unspecified: Secondary | ICD-10-CM | POA: Diagnosis not present

## 2016-05-20 LAB — CBC WITH DIFFERENTIAL/PLATELET
BASOS ABS: 0.1 10*3/uL (ref 0.0–0.1)
Basophils Relative: 0.6 % (ref 0.0–3.0)
EOS ABS: 0.2 10*3/uL (ref 0.0–0.7)
Eosinophils Relative: 1.7 % (ref 0.0–5.0)
HEMATOCRIT: 43 % (ref 39.0–52.0)
HEMOGLOBIN: 14.4 g/dL (ref 13.0–17.0)
LYMPHS PCT: 22.4 % (ref 12.0–46.0)
Lymphs Abs: 3.3 10*3/uL (ref 0.7–4.0)
MCHC: 33.5 g/dL (ref 30.0–36.0)
MCV: 83.2 fl (ref 78.0–100.0)
Monocytes Absolute: 1.7 10*3/uL — ABNORMAL HIGH (ref 0.1–1.0)
Monocytes Relative: 11.3 % (ref 3.0–12.0)
Neutro Abs: 9.4 10*3/uL — ABNORMAL HIGH (ref 1.4–7.7)
Neutrophils Relative %: 64 % (ref 43.0–77.0)
Platelets: 218 10*3/uL (ref 150.0–400.0)
RBC: 5.17 Mil/uL (ref 4.22–5.81)
RDW: 16.3 % — ABNORMAL HIGH (ref 11.5–15.5)
WBC: 14.7 10*3/uL — AB (ref 4.0–10.5)

## 2016-05-20 LAB — COMPREHENSIVE METABOLIC PANEL
ALT: 16 U/L (ref 0–53)
AST: 14 U/L (ref 0–37)
Albumin: 3.6 g/dL (ref 3.5–5.2)
Alkaline Phosphatase: 69 U/L (ref 39–117)
BILIRUBIN TOTAL: 0.5 mg/dL (ref 0.2–1.2)
BUN: 34 mg/dL — ABNORMAL HIGH (ref 6–23)
CO2: 33 mEq/L — ABNORMAL HIGH (ref 19–32)
CREATININE: 1.52 mg/dL — AB (ref 0.40–1.50)
Calcium: 9.2 mg/dL (ref 8.4–10.5)
Chloride: 104 mEq/L (ref 96–112)
GFR: 46.85 mL/min — ABNORMAL LOW (ref >60.00)
GLUCOSE: 89 mg/dL (ref 70–99)
Potassium: 3.5 mEq/L (ref 3.5–5.1)
SODIUM: 141 meq/L (ref 135–145)
Total Protein: 6.9 g/dL (ref 6.0–8.3)

## 2016-05-20 LAB — HIGH SENSITIVITY CRP: CRP, High Sensitivity: 4.33 mg/L (ref 0.000–5.000)

## 2016-05-20 LAB — SEDIMENTATION RATE: Sed Rate: 30 mm/hr — ABNORMAL HIGH (ref 0–20)

## 2016-05-20 NOTE — Patient Instructions (Signed)
Blood Smear Test Why am I having this test? This test examines your blood under a microscope to provide information about drugs and diseases that affect red blood cells (RBCs), white blood cells (WBCs), and platelets. It can also be used to diagnose hereditary or infectious diseases. When stains are applied to the blood sample, this test can identify leukemia, infection, parasitic diseases, and several other blood disorders. What kind of sample is taken? A blood sample is required for this test. It is usually collected by sticking a finger with a small needle. For infants, the blood sample is usually collected by sticking the child's heel with a small needle. How do I prepare for this test? There is no preparation required for this test. What are the reference ranges? Reference ranges are considered healthy ranges established after testing a large group of healthy people. Reference ranges may vary among different people, labs, and hospitals. It is your responsibility to obtain your test results. Ask the lab or department performing the test when and how you will get your results. What do the results mean? Normal blood smear test results include:  Normal quantity of RBCs, WBCs, and platelets.  Normal size, shape, and color of RBCs.  Normal WBC differential count. Talk with your health care provider to discuss your results, treatment options, and if necessary, the need for more tests. Talk with your health care provider if you have any questions about your results. Talk with your health care provider to discuss your results, treatment options, and if necessary, the need for more tests. Talk with your health care provider if you have any questions about your results. This information is not intended to replace advice given to you by your health care provider. Make sure you discuss any questions you have with your health care provider. Document Released: 04/20/2004 Document Revised: 12/01/2015  Document Reviewed: 08/14/2013 Elsevier Interactive Patient Education  2017 [employer].

## 2016-05-20 NOTE — Progress Notes (Signed)
 Subjective:    Patient ID: Shawn T Boger Jr., male    DOB: 11/02/1931, 83 y.o.   MRN: [id number]  HPI  Pt presents to the clinic today to discuss his chronically elevated white count. He was seen by Dr. Gutierrez last week for dizziness. Labs were drawn, showed a low potassium, increased creatinine, and elevated white count. His white count has been ranging 14-21, consistently over the last year. Most of the visits with me have been sick visits, so I didn't think anything of his white counts being high. He denies fatigue, loss of weight . He does have abnormal bruising and bleeding due to anticoagulation.  Review of Systems      Past Medical History:  Diagnosis Date  . Arthritis   . CAD    a. MI 01/29/1996 tx'd w/ TPA @ [facility]; b. Myoview 06/2005: EF 50%, scar @ apex, mild peri-infarct ischemia  . Cancer (HCC)    skin  . Chronic atrial fibrillation (HCC)    a. since 2006; b. on warfarin  . Chronic diastolic CHF (congestive heart failure) (HCC)    a. echo 04/2006: EF lower limits of nl, mod LVH, mild aortic root dilatation, & mild MR, biatrial enlargement; b. echo 04/2013: EF 60%, mod dilated LA, mild MR & TR, mod pulm HTN w/ RV systolic pressure 53, c. echo 04/21/14: EF 55-60%, unable to exclude WMA, severely dilated LA 6.6 cm, nl RVSP, mildly dilated aortic root  . COPD (chronic obstructive pulmonary disease) (HCC)   . CVA 1999,2016   x2  . DM   . Falls   . GERD (gastroesophageal reflux disease)   . History of kidney stones   . HYPERLIPIDEMIA   . HYPERTENSION   . Kidney stone    a. s/p left ureteral stenting 04/24/14  . Neuropathy of both feet   . Poor balance     Current Outpatient Prescriptions  Medication Sig Dispense Refill  . acetaminophen (TYLENOL) 650 MG CR tablet Take 650 mg by mouth every 8 (eight) hours as needed.      . albuterol (2.5 MG/3ML) 0.083% NEBU 3 mL, albuterol (5 MG/ML) 0.5% NEBU 0.5 mL Inhale 1 mg into the lungs.    . ALPRAZolam (XANAX) 0.25 MG tablet  Take 1 tablet (0.25 mg total) by mouth at bedtime as needed for anxiety. 90 tablet 0  . atorvastatin (LIPITOR) 40 MG tablet TAKE 1 TABLET DAILY 90 tablet 3  . BD PEN NEEDLE NANO U/F 32G X 4 MM MISC USE THREE TIMES A DAY FOR INSULIN ADMINISTRATION 270 each 1  . bimatoprost (LUMIGAN) 0.01 % SOLN Place 1 drop into both eyes at bedtime.    . budesonide-formoterol (SYMBICORT) 160-4.5 MCG/ACT inhaler Inhale 2 puffs into the lungs 2 (two) times daily. 3 Inhaler 3  . citalopram (CELEXA) 10 MG tablet TAKE 1 TABLET DAILY 90 tablet 1  . ELIQUIS 5 MG TABS tablet TAKE 1 TABLET TWICE A DAY 180 tablet 3  . ezetimibe (ZETIA) 10 MG tablet TAKE 1 TABLET DAILY 90 tablet 0  . finasteride (PROSCAR) 5 MG tablet TAKE 1 TABLET DAILY 90 tablet 4  . fluticasone (FLONASE) 50 MCG/ACT nasal spray Place into both nostrils daily.    . gabapentin (NEURONTIN) 100 MG capsule TAKE 1 CAPSULE THREE TIMES A DAY 270 capsule 1  . glipiZIDE (GLUCOTROL) 10 MG tablet TAKE 1 TABLET TWICE A DAY BEFORE MEALS 180 tablet 1  . HUMALOG KWIKPEN 100 UNIT/ML KiwkPen INJECT 18 UNITS WITH BREAKFAST, 20 UNITS   WITH LUNCH, AND 16 UNITS WITH DINNER 60 mL 0  . Insulin Detemir (LEVEMIR FLEXPEN) 100 UNIT/ML Pen Inject 42 Units into the skin daily at 10 pm.    . Insulin Syringe-Needle U-100 30G X 1/2" 1 ML MISC 1 each by Does not apply route 3 (three) times daily. 200 each 5  . metolazone (ZAROXOLYN) 5 MG tablet TAKE 1 TABLET DAILY AS NEEDED 90 tablet 2  . metoprolol succinate (TOPROL-XL) 50 MG 24 hr tablet TAKE 1 TABLET TWICE A DAY 180 tablet 2  . nitroGLYCERIN (NITROSTAT) 0.4 MG SL tablet Place 0.4 mg under the tongue every 5 (five) minutes as needed.      . nystatin-triamcinolone (MYCOLOG II) cream Apply 1 application topically 2 (two) times daily.     . Omega-3 Fatty Acids (FISH OIL) 1000 MG CAPS Take 1 capsule by mouth 3 (three) times daily.     . Potassium Chloride ER 20 MEQ TBCR Take 40 mEq by mouth daily. 180 tablet 3  . PROAIR HFA 108 (90 Base)  MCG/ACT inhaler USE 1 TO 2 INHALATIONS EVERY 4 HOURS AS NEEDED 25.5 g 4  . sitaGLIPtin (JANUVIA) 100 MG tablet Take 1 tablet (100 mg total) by mouth daily. 90 tablet 1  . tamsulosin (FLOMAX) 0.4 MG CAPS capsule TAKE 1 CAPSULE DAILY 90 capsule 1  . tiotropium (SPIRIVA HANDIHALER) 18 MCG inhalation capsule Place 1 capsule (18 mcg total) into inhaler and inhale daily. 90 capsule 3  . torsemide (DEMADEX) 20 MG tablet Take 60 mg by mouth daily.      No current facility-administered medications for this visit.     Allergies  Allergen Reactions  . Contrast Media [Iodinated Diagnostic Agents] Shortness Of Breath  . Morphine And Related Other (See Comments)    Hallucinations   . Niacin And Related Dermatitis    Family History  Problem Relation Age of Onset  . Heart disease Mother   . Heart disease Maternal Grandmother   . Diabetes Maternal Grandmother   . Cancer Neg Hx   . Stroke Neg Hx     Social History   Social History  . Marital status: Married    Spouse name: N/A  . Number of children: N/A  . Years of education: N/A   Occupational History  . Not on file.   Social History Main Topics  . Smoking status: Former Smoker    Packs/day: 2.00    Years: 40.00    Types: Cigarettes    Quit date: 05/24/1990  . Smokeless tobacco: Former User    Quit date: 05/24/1990  . Alcohol use No  . Drug use: No  . Sexual activity: Not Currently   Other Topics Concern  . Not on file   Social History Narrative  . No narrative on file     Constitutional: Pt reports fatigue (chronic). Denies fever, malaise, headache or abrupt weight changes.  Respiratory: Pt reports dyspnea on exertion (not new). Denies difficulty breathing, shortness of breath, cough or sputum production.   Cardiovascular: Denies chest pain, chest tightness, palpitations or swelling in the hands or feet.  Gastrointestinal: Pt reports intermittent constipation. Denies abdominal pain, bloating,  diarrhea or blood in the  stool.   No other specific complaints in a complete review of systems (except as listed in HPI above).  Objective:   Physical Exam   BP 108/62   Pulse 64   Temp 97.7 F (36.5 C) (Oral)   Wt 192 lb (87.1 kg)   SpO2 96%     BMI 30.99 kg/m  Wt Readings from Last 3 Encounters:  05/20/16 192 lb (87.1 kg)  05/13/16 185 lb (83.9 kg)  03/19/16 189 lb (85.7 kg)    General: Appears his stated age, obese in NAD. Cardiovascular: Normal rate with irregular rhythm.  Pulmonary/Chest: Normal effort and positive vesicular breath sounds. No respiratory distress. No wheezes, rales or ronchi noted.  Neurological: Alert and oriented.    BMET    Component Value Date/Time   NA 137 05/13/2016 1446   NA 143 02/21/2015 1108   NA 137 08/05/2014 1839   K 3.1 (L) 05/13/2016 1446   K 3.6 08/05/2014 1839   CL 91 (L) 05/13/2016 1446   CL 106 08/05/2014 1839   CO2 36 (H) 05/13/2016 1446   CO2 23 08/05/2014 1839   GLUCOSE 219 (H) 05/13/2016 1446   GLUCOSE 235 (H) 08/05/2014 1839   BUN 58 (H) 05/13/2016 1446   BUN 26 02/21/2015 1108   BUN 19 08/05/2014 1839   CREATININE 1.85 (H) 05/13/2016 1446   CREATININE 1.20 08/05/2014 1839   CALCIUM 9.9 05/13/2016 1446   CALCIUM 8.6 (L) 08/05/2014 1839   GFRNONAA 41 (L) 02/21/2015 1108   GFRNONAA 57 (L) 08/05/2014 1839   GFRAA 48 (L) 02/21/2015 1108   GFRAA >60 08/05/2014 1839    Lipid Panel     Component Value Date/Time   CHOL 120 08/05/2015 1135   TRIG 208.0 (H) 08/05/2015 1135   HDL 32.10 (L) 08/05/2015 1135   CHOLHDL 4 08/05/2015 1135   VLDL 41.6 (H) 08/05/2015 1135   LDLCALC 47 10/04/2014 0940    CBC    Component Value Date/Time   WBC 18.0 Repeated and verified X2. (HH) 05/13/2016 1446   RBC 5.54 05/13/2016 1446   HGB 15.4 05/13/2016 1446   HGB 13.3 08/05/2014 1839   HCT 45.4 05/13/2016 1446   HCT 40.1 08/05/2014 1839   PLT 251.0 05/13/2016 1446   PLT 246 08/05/2014 1839   MCV 82.1 05/13/2016 1446   MCV 83 08/05/2014 1839   MCH  26.1 10/09/2014 2249   MCHC 33.9 05/13/2016 1446   RDW 16.2 (H) 05/13/2016 1446   RDW 16.3 (H) 08/05/2014 1839   LYMPHSABS 3.8 05/13/2016 1446   LYMPHSABS 2.3 08/05/2014 1839   MONOABS 2.5 (H) 05/13/2016 1446   MONOABS 2.0 (H) 08/05/2014 1839   EOSABS 0.2 05/13/2016 1446   EOSABS 0.1 08/05/2014 1839   BASOSABS 0.1 05/13/2016 1446   BASOSABS 0.1 08/05/2014 1839    Hgb A1C Lab Results  Component Value Date   HGBA1C 6.5 05/13/2016           Assessment & Plan:   Leukocytosis:  Will repeat CBC, CMET, ESR, CRP and peripheral smear today May need referral to hematology for further evaluation Continue current meds at this time  Will follow up after labs. RTC in 2 months for follow up of chronic conditions.  Webb Silversmith, NP

## 2016-05-21 ENCOUNTER — Other Ambulatory Visit: Payer: Self-pay | Admitting: Internal Medicine

## 2016-05-21 LAB — PATHOLOGIST SMEAR REVIEW

## 2016-05-24 ENCOUNTER — Telehealth: Payer: Self-pay | Admitting: Internal Medicine

## 2016-05-24 NOTE — Telephone Encounter (Signed)
Pt is requesting a cb to discuss lab results from 2/8 visit.

## 2016-05-25 ENCOUNTER — Encounter: Payer: Self-pay | Admitting: Internal Medicine

## 2016-05-27 ENCOUNTER — Ambulatory Visit (INDEPENDENT_AMBULATORY_CARE_PROVIDER_SITE_OTHER): Payer: Medicare Other | Admitting: Internal Medicine

## 2016-05-27 ENCOUNTER — Encounter: Payer: Self-pay | Admitting: Internal Medicine

## 2016-05-27 ENCOUNTER — Ambulatory Visit: Payer: Medicare Other | Admitting: Pulmonary Disease

## 2016-05-27 ENCOUNTER — Telehealth: Payer: Self-pay | Admitting: Cardiovascular Disease

## 2016-05-27 VITALS — BP 134/80 | HR 78 | Wt 192.0 lb

## 2016-05-27 DIAGNOSIS — J449 Chronic obstructive pulmonary disease, unspecified: Secondary | ICD-10-CM

## 2016-05-27 NOTE — Telephone Encounter (Signed)
Pt's wt record placed in Dr. Donivan Scull box for review. Pt did not record BP or HR, only wt. Would he be willing to see Christell Faith, PA?

## 2016-05-27 NOTE — Telephone Encounter (Signed)
This was offered .  They wanted to think it over and let us know when they are done with Dr. Sinda Du today.

## 2016-05-27 NOTE — Patient Instructions (Signed)
Continue inhalers as prescribed Follow up with Dr Alva Garnet in 6 months

## 2016-05-27 NOTE — Telephone Encounter (Signed)
Patient wants a sooner appt as he has had some weight gain.  Log placed in nurse box.  Patient will make a sooner appt at checkout after seeing kasa this morning.

## 2016-05-27 NOTE — Progress Notes (Signed)
PULMONARY OFFICE FOLLOW UP NOTE  Date of initial consultation: 03/10/15 Reason for consultation: COPD Initial HPI: 17 M diagnosed with COPD approx 2004. Previously followed by Dr Raul Del. Wishes to establish care here. At his baseline, he is dyspneic with minimal exertion limited to ambulating no more than 50 meters and less than one flight of stairs. He has little day to day variation. He produces modest amount of white mucus on most days. He has never had hemoptysis. He has been maintained on Symbicort and Spiriva with PRN albuterol which he uses 1-2 times per day on average. He is also on prednisone 5 mg daily which he believes has been very beneficial. His records from Dr Gust Brooms office also indicate a diagnosis of pulmonary fibrosis but his CXR does not seem to demonstrate this and a recent CT abd/pelvis (06/03/14) reveals no fibrosis in the visualized portion of the lung bases. That CT does demonstrate a nodule in the LLL which has not been re-imaged. He also has OSA diagnosed several years ago for which he is on CPAP. He tolerates this well. Lastly, he has chronic AF and is followed by cardiology for this. Initial IMP/PLAN: COPD, Obesity, Restrictive physiology - due to obesity, Severe dyspnea - multifactorial including COPD, CAF, obesity, deconditioning, Lung nodule on CTAP 06/03/14, OSA - well controlled on CPAP, DM2 - hyperglycemia on most recent BMET. PLAN: Cont Symbicort and Spiriva as controller regimen, Cont prednisone @ 5 mg daily for now, Cont PRN albuterol, CT chest to re-eval pulmonary nodule in LLL,   CT chest 04/18/15: Interstitial lung disease characterized by symmetric upper lobe predominant extensive fine nodularity, interlobular septal thickening and ground-glass attenuation. Findings have mildly to moderately progressed since 02/01/2006 chest CT. Stable dominant 8 mm left lower lobe pulmonary nodule, for which 10 month stability has been demonstrated. Stable mild cardiomegaly. Left  main and 3 vessel coronary atherosclerosis  ROV 04/24/15: No change overall. Trial off Symbicort as it has not been clearly beneficial  ROV 09/09/15: Has lost 6 pounds with improvement in dyspnea and glycemic control. No worsening off Symbicort. Changed prednisone to 5 mg qod  ROV 10/17/15: Increased dyspnea and intermittent wheezing. Resume Symbicort. Continue prednisone @ 5 mg qod.   ROV 12/19/15: off prednisone altogether. No change in symptoms. Has lost a couple more pounds.   SUBJ: No new complaints. Remains on Spiriva and Symbicort. Completely off prednisone. Denies CP, fever, purulent sputum, hemoptysis, LE edema and calf tenderness.  No signs of infection at this time   Review of Systems:  Gen:  Denies  fever, sweats, chills weigh loss   HEENT: Denies blurred vision, double vision, ear pain, eye pain, hearing loss, nose bleeds, sore throat  Cardiac:  No dizziness, chest pain or heaviness, chest tightness,edema  Resp:   Denies cough or sputum porduction, shortness of breath,wheezing, hemoptysis,   Other:  All other systems negative  OBJ:  Vitals:   05/27/16 1113  BP: 134/80  Pulse: 78  SpO2: 92%  Weight: 192 lb (87.1 kg)    EXAM:  Gen: WDWN in NAD HEENT: All WNL Lungs: Diminished BS, no wheezes Cardiovascular: IRIR, no M Abdomen: Obese, soft, NT +BS Ext: 1-2+ pitting pretibial edema      IMPRESSION:   Chronic dyspnea - improving with lifestyle changes as noted above Former smoker Mild interstitial lung disease, NOS Restrictive pulmonary physiology due to ILD and obesity LLL Lung nodule - stable over approx one year. No further F/U planned OSA - well controlled on CPAP  PLAN:  Cont Symbicort and Spiriva  Cont PRN albuterol Continue efforts @ weight loss and reconditioning ROV 6 months    I have personally obtained a history, examined the patient, evaluated Pertinent laboratory and RadioGraphic/imaging results, and  formulated the assessment and  plan   The Patient requires high complexity decision making for assessment and support, frequent evaluation and titration of therapies.  Patient satisfied with Plan of action and management. All questions answered  Corrin Parker, M.D.  [facility]  Medical Director [place] Director [facility] Cardio-Pulmonary Department

## 2016-06-02 ENCOUNTER — Ambulatory Visit (INDEPENDENT_AMBULATORY_CARE_PROVIDER_SITE_OTHER): Payer: Medicare Other | Admitting: Internal Medicine

## 2016-06-02 ENCOUNTER — Encounter: Payer: Self-pay | Admitting: Internal Medicine

## 2016-06-02 VITALS — BP 110/70 | HR 63 | Temp 97.8°F | Wt 186.0 lb

## 2016-06-02 DIAGNOSIS — M7652 Patellar tendinitis, left knee: Secondary | ICD-10-CM | POA: Diagnosis not present

## 2016-06-02 NOTE — Patient Instructions (Signed)
Patellar Tendinitis Patellar tendinitis, also called jumper's knee, is inflammation of the patellar tendon. Tendons are cord-like tissues that connect muscles to bones. The patellar tendon connects the bottom of the kneecap (patella) to the top of the shin bone (tibia). Patellar tendinitis causes pain in the front of the knee. The condition happens in the following stages:  Stage 1. In this stage, there is pain only after activity.  Stage 2: In this stage, you have pain during and after activity.  Stage 3: In this stage, you have pain during and after activity that limits your ability to do the activity.  Stage 4: In this stage, the tendon tears and severely limits your activity. What are the causes? This condition is caused by repeated (repetitive) stress on the tendon. This stress may cause the tendon to stretch, swell, thicken, or tear. What increases the risk? The following factors make you more likely to develop this condition:  Participating in sports that involve running, kicking and jumping, especially on hard surfaces. These include:  Basketball.  Volleyball.  Soccer.  Track and field.  Having tight thigh muscles.  Having received steroid injections in the tendon.  Having had knee surgery.  Being 41-29 years old.  Having rheumatoid arthritis or diabetes.  Training too hard. What are the signs or symptoms? The main symptom of this condition is pain in the front of the knee. The pain usually starts slowly then it gradually gets worse. It may become painful to straighten your leg. How is this diagnosed? This condition may be diagnosed based on:  Your symptoms.  A medical history.  A physical exam. During the physical exam, your health care provider may check for tenderness in your patella, tightness in your thigh muscles, and pain when you straighten your knee.  Imaging tests, including:  X-rays. These will show the position and condition of your  patella.  MRI. This will show any tears in your tendon.  Ultrasound. This will show any swelling in your tendon and the thickness of your tendon. How is this treated? Treatment for this condition depends on the stage of the condition. It may involve:  Avoiding activities that cause pain.  Icing your knee.  Taking an NSAID to reduce pain and swelling.  Doing stretching and strengthening exercises (physical therapy) when pain and swelling improve.  Having sound wave stimulation to promote healing.  Wearing a knee brace. This may be needed if your condition does not improve with treatment.  Using crutches or a walker. This may be needed if your condition does not improve with treatment.  Surgery. This may be done if you have stage 4 tendinitis. Follow these instructions at home: If You Have a Knee Brace:  Wear it as told by your health care provider. Remove it only as told by your health care provider.  Loosen the brace if your toes become numb and tingle, or if they turn cold and blue.  Do not let your brace get wet if it is not waterproof.  Keep the brace clean.  Ask your health care provider when it is safe for you to drive. Managing pain, stiffness, and swelling  Take over-the-counter and prescription medicines only as told by your health care provider.  If directed, apply ice to the injured area.  Put ice in a plastic bag.  Place a towel between your skin and the bag.  Leave the ice on for 20 minutes, 2-3 times a day.  Move your toes often to avoid stiffness  and to lessen swelling.  Raise (elevate) your knee above the level of your heart while you are sitting or lying down. Activity  Return to your normal activities as told by your health care provider. Ask your health care provider what activities are safe for you.  Do exercises as told by your health care provider. General instructions  Do not use the injured limb to support your body weight until your  health care provider says that you can. Use your crutches or walker as told by your health care provider.  Keep all follow-up visits as told by your health care provider. This is important. How is this prevented?  Warm up and stretch before being active.  Cool down and stretch after being active.  Give your body time to rest between periods of activity.  Make sure to use equipment that fits you.  Be safe and responsible while being active to avoid falls.  Do at least 150 minutes of moderate-intensity exercise each week, such as brisk walking or water aerobics.  Maintain physical fitness, including:  Strength.  Flexibility.  Cardiovascular fitness.  Endurance. Contact a health care provider if:  Your symptoms have not improve in 6 weeks.  Your symptoms get worse. This information is not intended to replace advice given to you by your health care provider. Make sure you discuss any questions you have with your health care provider. Document Released: 03/29/2005 Document Revised: 12/02/2015 Document Reviewed: 12/31/2014 Elsevier Interactive Patient Education  2017 [employer].

## 2016-06-02 NOTE — Progress Notes (Signed)
Subjective:    Patient ID: [name]., male    DOB: 11/19/1931, 83 y.o.   MRN: [id number]  HPI  Pt presents to the clinic today with c/o left knee pain. This started 3 days ago. He describes the pain as achy and tight. The pain radiates down his leg. The pain is worse when he tries to lift his leg up off the floor. He has no pain with weight bearing. He has not noticed any swelling. He denies any specific injury to the area but has had frequent falls in the past. He has not had xrays of his knees in > 10 years. He has tried heat and epsom salt water with minimal relief.   Review of Systems      Past Medical History:  Diagnosis Date  . Arthritis   . CAD    a. MI 01/29/1996 tx'd w/ TPA @ [facility]; b. Myoview 06/2005: EF 50%, scar @ apex, mild peri-infarct ischemia  . Cancer ([place])    skin  . Chronic atrial fibrillation ([place])    a. since 2006; b. on warfarin  . Chronic diastolic CHF (congestive heart failure) ([place])    a. echo 04/2006: EF lower limits of nl, mod LVH, mild aortic root dilatation, & mild MR, biatrial enlargement; b. echo 04/2013: EF 60%, mod dilated LA, mild MR & TR, mod pulm HTN w/ RV systolic pressure 53, c. echo 04/21/14: EF 55-60%, unable to exclude WMA, severely dilated LA 6.6 cm, nl RVSP, mildly dilated aortic root  . COPD (chronic obstructive pulmonary disease) ([place])   . CVA [id number],3419   x2  . DM   . Falls   . GERD (gastroesophageal reflux disease)   . History of kidney stones   . HYPERLIPIDEMIA   . HYPERTENSION   . Kidney stone    a. s/p left ureteral stenting 04/24/14  . Neuropathy of both feet   . Poor balance     Current Outpatient Prescriptions  Medication Sig Dispense Refill  . acetaminophen (TYLENOL) 650 MG CR tablet Take 650 mg by mouth every 8 (eight) hours as needed.      Marland Kitchen albuterol (2.5 MG/3ML) 0.083% NEBU 3 mL, albuterol (5 MG/ML) 0.5% NEBU 0.5 mL Inhale 1 mg into the lungs.    . ALPRAZolam (XANAX) 0.25 MG tablet Take 1 tablet (0.25 mg total)  by mouth at bedtime as needed for anxiety. 90 tablet 0  . atorvastatin (LIPITOR) 40 MG tablet TAKE 1 TABLET DAILY 90 tablet 3  . BD PEN NEEDLE NANO U/F 32G X 4 MM MISC USE THREE TIMES A DAY FOR INSULIN ADMINISTRATION 270 each 1  . bimatoprost (LUMIGAN) 0.01 % SOLN Place 1 drop into both eyes at bedtime.    . budesonide-formoterol (SYMBICORT) 160-4.5 MCG/ACT inhaler Inhale 2 puffs into the lungs 2 (two) times daily. 3 Inhaler 3  . citalopram (CELEXA) 10 MG tablet TAKE 1 TABLET DAILY 90 tablet 1  . ELIQUIS 5 MG TABS tablet TAKE 1 TABLET TWICE A DAY 180 tablet 3  . ezetimibe (ZETIA) 10 MG tablet TAKE 1 TABLET DAILY 90 tablet 0  . finasteride (PROSCAR) 5 MG tablet TAKE 1 TABLET DAILY 90 tablet 4  . fluticasone (FLONASE) 50 MCG/ACT nasal spray Place into both nostrils daily.    Marland Kitchen gabapentin (NEURONTIN) 100 MG capsule TAKE 1 CAPSULE THREE TIMES A DAY 270 capsule 1  . glipiZIDE (GLUCOTROL) 10 MG tablet TAKE 1 TABLET TWICE A DAY BEFORE MEALS 180 tablet 1  .  HUMALOG KWIKPEN 100 UNIT/ML KiwkPen INJECT 18 UNITS WITH BREAKFAST, 20 UNITS WITH LUNCH, AND 16 UNITS WITH DINNER 60 mL 0  . Insulin Detemir (LEVEMIR FLEXPEN) 100 UNIT/ML Pen Inject 42 Units into the skin daily at 10 pm.    . Insulin Syringe-Needle U-100 30G X 1/2" 1 ML MISC 1 each by Does not apply route 3 (three) times daily. 200 each 5  . metolazone (ZAROXOLYN) 5 MG tablet TAKE 1 TABLET DAILY AS NEEDED 90 tablet 2  . metoprolol succinate (TOPROL-XL) 50 MG 24 hr tablet TAKE 1 TABLET TWICE A DAY 180 tablet 2  . nitroGLYCERIN (NITROSTAT) 0.4 MG SL tablet Place 0.4 mg under the tongue every 5 (five) minutes as needed.      . nystatin-triamcinolone (MYCOLOG II) cream Apply 1 application topically 2 (two) times daily.     . Omega-3 Fatty Acids (FISH OIL) 1000 MG CAPS Take 1 capsule by mouth 3 (three) times daily.     . Potassium Chloride ER 20 MEQ TBCR Take 40 mEq by mouth daily. 180 tablet 3  . PROAIR HFA 108 (90 Base) MCG/ACT inhaler USE 1 TO 2  INHALATIONS EVERY 4 HOURS AS NEEDED 25.5 g 4  . sitaGLIPtin (JANUVIA) 100 MG tablet Take 1 tablet (100 mg total) by mouth daily. 90 tablet 1  . tamsulosin (FLOMAX) 0.4 MG CAPS capsule TAKE 1 CAPSULE DAILY 90 capsule 1  . tiotropium (SPIRIVA HANDIHALER) 18 MCG inhalation capsule Place 1 capsule (18 mcg total) into inhaler and inhale daily. 90 capsule 3  . torsemide (DEMADEX) 20 MG tablet Take 60 mg by mouth daily.      No current facility-administered medications for this visit.     Allergies  Allergen Reactions  . Contrast Media [Iodinated Diagnostic Agents] Shortness Of Breath  . Morphine And Related Other (See Comments)    Hallucinations   . Niacin And Related Dermatitis    Family History  Problem Relation Age of Onset  . Heart disease Mother   . Heart disease Maternal Grandmother   . Diabetes Maternal Grandmother   . Cancer Neg Hx   . Stroke Neg Hx     Social History   Social History  . Marital status: Married    Spouse name: N/A  . Number of children: N/A  . Years of education: N/A   Occupational History  . Not on file.   Social History Main Topics  . Smoking status: Former Smoker    Packs/day: 2.00    Years: 40.00    Types: Cigarettes    Quit date: 05/24/1990  . Smokeless tobacco: Former Systems developer    Quit date: 05/24/1990  . Alcohol use No  . Drug use: No  . Sexual activity: Not Currently   Other Topics Concern  . Not on file   Social History Narrative  . No narrative on file     Constitutional: Denies fever, malaise, fatigue, headache or abrupt weight changes.  Musculoskeletal: Pt reports left knee pain. Denies decrease in range of motion, difficulty with gait, muscle pain or joint swelling.  Skin: Denies redness, rashes, lesions or ulcercations.    No other specific complaints in a complete review of systems (except as listed in HPI above).  Objective:   Physical Exam  BP 110/70   Pulse 63   Temp 97.8 F (36.6 C) (Oral)   Wt 186 lb (84.4 kg)    SpO2 94%   BMI 30.02 kg/m  Wt Readings from Last 3 Encounters:  06/02/16 186  lb (84.4 kg)  05/27/16 192 lb (87.1 kg)  05/20/16 192 lb (87.1 kg)    General: Appears his stated age, chronically ill appearing in NAD. Skin: Warm, dry and intact. No redness noted of the left knee but mild warmth. Musculoskeletal: Decreased extension of the left knee. Normal flexion. No crepitus noted. No joint swelling noted. Pain with palpation over the patellar tibial tendon.  Gait slow but steady.    BMET    Component Value Date/Time   NA 141 05/20/2016 1041   NA 143 02/21/2015 1108   NA 137 08/05/2014 1839   K 3.5 05/20/2016 1041   K 3.6 08/05/2014 1839   CL 104 05/20/2016 1041   CL 106 08/05/2014 1839   CO2 33 (H) 05/20/2016 1041   CO2 23 08/05/2014 1839   GLUCOSE 89 05/20/2016 1041   GLUCOSE 235 (H) 08/05/2014 1839   BUN 34 (H) 05/20/2016 1041   BUN 26 02/21/2015 1108   BUN 19 08/05/2014 1839   CREATININE 1.52 (H) 05/20/2016 1041   CREATININE 1.20 08/05/2014 1839   CALCIUM 9.2 05/20/2016 1041   CALCIUM 8.6 (L) 08/05/2014 1839   GFRNONAA 41 (L) 02/21/2015 1108   GFRNONAA 57 (L) 08/05/2014 1839   GFRAA 48 (L) 02/21/2015 1108   GFRAA >60 08/05/2014 1839    Lipid Panel     Component Value Date/Time   CHOL 120 08/05/2015 1135   TRIG 208.0 (H) 08/05/2015 1135   HDL 32.10 (L) 08/05/2015 1135   CHOLHDL 4 08/05/2015 1135   VLDL 41.6 (H) 08/05/2015 1135   LDLCALC 47 10/04/2014 0940    CBC    Component Value Date/Time   WBC 14.7 (H) 05/20/2016 1041   RBC 5.17 05/20/2016 1041   HGB 14.4 05/20/2016 1041   HGB 13.3 08/05/2014 1839   HCT 43.0 05/20/2016 1041   HCT 40.1 08/05/2014 1839   PLT 218.0 05/20/2016 1041   PLT 246 08/05/2014 1839   MCV 83.2 05/20/2016 1041   MCV 83 08/05/2014 1839   MCH 26.1 10/09/2014 2249   MCHC 33.5 05/20/2016 1041   RDW 16.3 (H) 05/20/2016 1041   RDW 16.3 (H) 08/05/2014 1839   LYMPHSABS 3.3 05/20/2016 1041   LYMPHSABS 2.3 08/05/2014 1839   MONOABS  1.7 (H) 05/20/2016 1041   MONOABS 2.0 (H) 08/05/2014 1839   EOSABS 0.2 05/20/2016 1041   EOSABS 0.1 08/05/2014 1839   BASOSABS 0.1 05/20/2016 1041   BASOSABS 0.1 08/05/2014 1839    Hgb A1C Lab Results  Component Value Date   HGBA1C 6.5 05/13/2016            Assessment & Plan:   Patellar Tendonitis:  Avoid heat, use ice instead eRx for Pred Burst x 5 days (he has the pills at home to do this) Knee exercises given  RTC as needed or if symptoms persist or worsen BAITY, REGINA, NP

## 2016-06-14 ENCOUNTER — Other Ambulatory Visit: Payer: Self-pay

## 2016-06-14 DIAGNOSIS — F419 Anxiety disorder, unspecified: Secondary | ICD-10-CM

## 2016-06-14 MED ORDER — ALPRAZOLAM 0.25 MG PO TABS: 0.2500 mg | ORAL_TABLET | Freq: Every evening | ORAL | 0 refills | Status: DC | PRN

## 2016-06-14 NOTE — Telephone Encounter (Signed)
Last filled 03/25/16 #90---paper Rx placed in your box for signature please return to me

## 2016-06-14 NOTE — Telephone Encounter (Signed)
Rx faxed

## 2016-06-14 NOTE — Telephone Encounter (Signed)
Signed, placed in your inbox

## 2016-06-20 ENCOUNTER — Other Ambulatory Visit: Payer: Self-pay | Admitting: Cardiovascular Disease

## 2016-06-22 ENCOUNTER — Encounter: Payer: Self-pay | Admitting: Internal Medicine

## 2016-06-23 ENCOUNTER — Encounter: Payer: Self-pay | Admitting: Internal Medicine

## 2016-06-23 ENCOUNTER — Other Ambulatory Visit: Payer: Self-pay | Admitting: Internal Medicine

## 2016-06-23 DIAGNOSIS — M25562 Pain in left knee: Secondary | ICD-10-CM

## 2016-06-25 ENCOUNTER — Ambulatory Visit (INDEPENDENT_AMBULATORY_CARE_PROVIDER_SITE_OTHER)
Admission: RE | Admit: 2016-06-25 | Discharge: 2016-06-25 | Disposition: A | Discharge status: Home / Self Care | Payer: Medicare Other | Source: Ambulatory Visit | Attending: Internal Medicine | Admitting: Internal Medicine

## 2016-06-25 DIAGNOSIS — M25562 Pain in left knee: Secondary | ICD-10-CM | POA: Diagnosis not present

## 2016-06-30 ENCOUNTER — Ambulatory Visit: Payer: Medicare Other | Admitting: Family Medicine

## 2016-06-30 ENCOUNTER — Encounter: Payer: Self-pay | Admitting: Internal Medicine

## 2016-07-04 NOTE — Progress Notes (Signed)
Cardiology Office Note  Date:  07/05/2016   ID:  Shawn Grace., DOB 04/17/1931, MRN [id number]  PCP:  Webb Silversmith, NP   Chief Complaint  Patient presents with  . other    6 month follow up. Meds reviewed by the pt. verbally. Pt. c/o twinges in chest at times.     HPI:  [name] is a pleasant 83 year old gentleman, who has a history of diabetes type 2,poorly controlled coronary artery disease, status post myocardial infarction treated with TPA at [facility] in January 29, 1996.  chronic atrial fibrillation since 2006, on warfarin.  2542 had a cva, uncertain if he was on anticoagulation at the time History off falls.  Seen by Dr. Raul Del, pulmonary. Also seen by CHF clinic. Several admissions to the hospital dating back to December 2014 with urine output function, urinary tract infections, status post stent placement by Dr. Elenor Quinones. He presents today for evaluation of his acute on chronic diastolic CHF  In follow-up he reports that his weight has been lower over the past month or 2 He takes metolazone 1/2 pill for weight 190 or higher Otherwise takes Torsemide 40 BID Daily Most recent weight 182 pounds MildLeg swelling left >Right Denies any significant shortness of breath He reports that he is been following a strict diet  Hemoglobin A1c Previously elevated at 9.6, Recently down to 6.5 Potassium was 3.1, creatinine 1.85, BUN 58 ( after metolazone)  EKG on today's visit showing atrial fibrillation with rate 70 bpm, old anterior MI,  PVCs noted  Other past medical history history of strokes documented on MRI. History of fall, fell backwards when walking with his walker hospital 06/03/2014 with discharge on the 26th with urinary tract infection requiring antibiotics.  hospital admission 05/02/2014 with acute stroke. He was off warfarin for circumcision in early January 2016. He did have Lovenox bridge MRI showed multiple embolic strokes, left parietal,  occipital areas. He was seen by neurology. He reports having continued vision problems. INR was 2 and he was continued on his warfarin. He is doing home physical therapy. At the time of discharge he was started on diltiazem. He does have chronic lower extremity edema. He has not received the diltiazem yet and is waiting for mail-order.  carotid ultrasound showing bilateral mild plaque dated 05/03/2014 Echocardiogram 05/03/2014 showing normal LV systolic function, normal right ventricular systolic pressure MRI brain showing multiple punctate foci acute infarction left parietal, left occipital, left frontal lobes likely embolic, chronic right occipital infarct  EKG on today's visit shows age fibrillation with ventricular rate 75 bpm, old anterior MI, inferior MI INR 2 days ago was 1.6. INR today 1.8  hospital admission January 26 2014 with discharge October 20 with urosepsis, bladder outflow obstruction.cultures grew [facility] was called to place a Foley catheter. Initial creatinine 1.56, BUN 25.  Echocardiogram 01/25/2014 showing moderate pulmonary hypertension, normal ejection fraction Notes indicate he had antibiotics, diuresis through his hospital course with improvement of his renal function down to creatinine 1.26. Creatinine was checked 2 days ago and was up to 1.4 with BUN 29 He had a Foley catheter for 10 days which was removed  He fell in January 2015 after taking a nap, he fell into his dresser drawer. Significant trauma to his left arm He fell possibly in April 2015the details are unavailable. Did not hurt himself [place] again recently while in the bathroom. Lost his footing, fell backwards, broke the toilet lid, large left flank hematoma  Myoview in March 2007  showed an ejection fraction of 50% with an old scar at the apex and mild peri-infarct ischemia.  echocardiogram in January 2008 showed an ejection fraction at the lower limits of normal with moderate LVH, mild aortic  root dilatation, and mild mitral regurgitation. There was biatrial enlargement.  echocardiogram January 2015 showing ejection fraction 60%, moderately dilated left atrium, mild MR and TR, moderate pulmonary hypertension with right ventricular systolic pressure 53  Previous carotid Dopplers in January of 2008 showed 0-39% stenosis bilaterally. Carotid ultrasound may 2009 showing mild bilateral plaque, no significant stenoses  PMH:   has a past medical history of Arthritis; CAD; Cancer ([place]); Chronic atrial fibrillation ([place]); Chronic diastolic CHF (congestive heart failure) ([facility]); COPD (chronic obstructive pulmonary disease) ([place]); CVA (1540,0867); DM; Falls; GERD (gastroesophageal reflux disease); History of kidney stones; HYPERLIPIDEMIA; HYPERTENSION; Kidney stone; Neuropathy of both feet; and Poor balance.  PSH:    Past Surgical History:  Procedure Laterality Date  . BLADDER SURGERY     stent placement   . CARDIAC CATHETERIZATION  1997   [facility]  . CAROTID STENT INSERTION  1997  . CIRCUMCISION  2016  . CORONARY ANGIOPLASTY  1997   s/p stent placement x 2   . CYSTOSCOPY W/ URETERAL STENT REMOVAL Left 10/09/2014   Procedure: CYSTOSCOPY WITH STENT REMOVAL;  Surgeon: Hollice Espy, MD;  Location: [facility] ORS;  Service: Urology;  Laterality: Left;  . CYSTOSCOPY WITH STENT PLACEMENT Left 10/09/2014   Procedure: CYSTOSCOPY WITH STENT PLACEMENT;  Surgeon: Hollice Espy, MD;  Location: [facility] ORS;  Service: Urology;  Laterality: Left;  . KIDNEY SURGERY  05/2013   s/p stent placement   . stents ureters Bilateral   . TONSILLECTOMY AND ADENOIDECTOMY  1959  . URETEROSCOPY WITH HOLMIUM LASER LITHOTRIPSY Left 10/09/2014   Procedure: URETEROSCOPY WITH HOLMIUM LASER LITHOTRIPSY;  Surgeon: Hollice Espy, MD;  Location: [facility] ORS;  Service: Urology;  Laterality: Left;    Current Outpatient Prescriptions  Medication Sig Dispense Refill  . acetaminophen (TYLENOL) 650 MG CR tablet Take 650 mg by mouth every  8 (eight) hours as needed.      Marland Kitchen albuterol (2.5 MG/3ML) 0.083% NEBU 3 mL, albuterol (5 MG/ML) 0.5% NEBU 0.5 mL Inhale 1 mg into the lungs.    . ALPRAZolam (XANAX) 0.25 MG tablet Take 1 tablet (0.25 mg total) by mouth at bedtime as needed for anxiety. 90 tablet 0  . atorvastatin (LIPITOR) 40 MG tablet TAKE 1 TABLET DAILY 90 tablet 3  . BD PEN NEEDLE NANO U/F 32G X 4 MM MISC USE THREE TIMES A DAY FOR INSULIN ADMINISTRATION 270 each 1  . budesonide-formoterol (SYMBICORT) 160-4.5 MCG/ACT inhaler Inhale 2 puffs into the lungs 2 (two) times daily. 3 Inhaler 3  . citalopram (CELEXA) 10 MG tablet TAKE 1 TABLET DAILY 90 tablet 1  . dorzolamide (TRUSOPT) 2 % ophthalmic solution Place 1 drop into both eyes 3 (three) times daily.     Marland Kitchen ELIQUIS 5 MG TABS tablet TAKE 1 TABLET TWICE A DAY 180 tablet 0  . ezetimibe (ZETIA) 10 MG tablet TAKE 1 TABLET DAILY 90 tablet 0  . finasteride (PROSCAR) 5 MG tablet TAKE 1 TABLET DAILY 90 tablet 4  . fluticasone (FLONASE) 50 MCG/ACT nasal spray Place into both nostrils daily.    Marland Kitchen gabapentin (NEURONTIN) 100 MG capsule TAKE 1 CAPSULE THREE TIMES A DAY 270 capsule 1  . HUMALOG KWIKPEN 100 UNIT/ML KiwkPen INJECT 18 UNITS WITH BREAKFAST, 20 UNITS WITH LUNCH, AND 16 UNITS WITH DINNER 60 mL  0  . Insulin Detemir (LEVEMIR FLEXPEN) 100 UNIT/ML Pen Inject 42 Units into the skin daily at 10 pm.    . Insulin Syringe-Needle U-100 30G X 1/2" 1 ML MISC 1 each by Does not apply route 3 (three) times daily. 200 each 5  . latanoprost (XALATAN) 0.005 % ophthalmic solution Place 1 drop into both eyes at bedtime.     . metolazone (ZAROXOLYN) 5 MG tablet TAKE 1 TABLET DAILY AS NEEDED 90 tablet 2  . metoprolol succinate (TOPROL-XL) 50 MG 24 hr tablet TAKE 1 TABLET TWICE A DAY 180 tablet 2  . nitroGLYCERIN (NITROSTAT) 0.4 MG SL tablet Place 0.4 mg under the tongue every 5 (five) minutes as needed.      . nystatin-triamcinolone (MYCOLOG II) cream Apply 1 application topically 2 (two) times daily.      . Omega-3 Fatty Acids (FISH OIL) 1000 MG CAPS Take 1 capsule by mouth 3 (three) times daily.     . Potassium Chloride ER 20 MEQ TBCR Take 40 mEq by mouth daily. 180 tablet 3  . PROAIR HFA 108 (90 Base) MCG/ACT inhaler USE 1 TO 2 INHALATIONS EVERY 4 HOURS AS NEEDED 25.5 g 4  . sitaGLIPtin (JANUVIA) 100 MG tablet Take 1 tablet (100 mg total) by mouth daily. 90 tablet 1  . tamsulosin (FLOMAX) 0.4 MG CAPS capsule TAKE 1 CAPSULE DAILY 90 capsule 1  . tiotropium (SPIRIVA HANDIHALER) 18 MCG inhalation capsule Place 1 capsule (18 mcg total) into inhaler and inhale daily. 90 capsule 3  . torsemide (DEMADEX) 20 MG tablet Take 60 mg by mouth daily.     Marland Kitchen glipiZIDE (GLUCOTROL) 10 MG tablet TAKE 1 TABLET TWICE A DAY BEFORE MEALS 180 tablet 0   No current facility-administered medications for this visit.      Allergies:   Contrast media [iodinated diagnostic agents]; Morphine and related; and Niacin and related   Social History:  The patient  reports that he quit smoking about 26 years ago. His smoking use included Cigarettes. He has a 80.00 pack-year smoking history. He quit smokeless tobacco use about 26 years ago. He reports that he does not drink alcohol or use drugs.   Family History:   family history includes Diabetes in his maternal grandmother; Heart disease in his maternal grandmother and mother.    Review of Systems: Review of Systems  Constitutional: Negative.   Respiratory: Negative.   Cardiovascular: Negative.   Gastrointestinal: Negative.   Musculoskeletal: Negative.   Neurological: Negative.   Psychiatric/Behavioral: Negative.   All other systems reviewed and are negative.    PHYSICAL EXAM: VS:  BP (!) 108/58 (BP Location: Left Arm, Patient Position: Sitting, Cuff Size: Normal)   Pulse 69   Ht '5\' 6"'$  (1.676 m)   Wt 187 lb 8 oz (85 kg)   BMI 30.26 kg/m  , BMI Body mass index is 30.26 kg/m. GEN: Well nourished, well developed, in no acute distress  HEENT: normal  Neck: no  JVD, carotid bruits, or masses Cardiac: RRR; no murmurs, rubs, or gallops,Trace lower extremity edema  Respiratory:  clear to auscultation bilaterally, normal work of breathing GI: soft, nontender, nondistended, + BS MS: no deformity or atrophy  Skin: warm and dry, no rash Neuro:  Strength and sensation are intact Psych: euthymic mood, full affect    Recent Labs: 05/13/2016: TSH 5.79 05/20/2016: ALT 16; BUN 34; Creatinine, Ser 1.52; Hemoglobin 14.4; Platelets 218.0; Potassium 3.5; Sodium 141    Lipid Panel Lab Results  Component Value  Date   CHOL 120 08/05/2015   HDL 32.10 (L) 08/05/2015   LDLCALC 47 10/04/2014   TRIG 208.0 (H) 08/05/2015      Wt Readings from Last 3 Encounters:  07/05/16 187 lb 8 oz (85 kg)  06/02/16 186 lb (84.4 kg)  05/27/16 192 lb (87.1 kg)       ASSESSMENT AND PLAN:  Mixed hyperlipidemia - Plan: EKG 12-Lead Cholesterol is at goal on the current lipid regimen. No changes to the medications were made.  Essential hypertension - Plan: EKG 12-Lead Blood pressure running low on today's visit. No changes made to the medications. We did warn him against dehydration as this would drop his blood pressure  Coronary artery disease involving native coronary artery of native heart without angina pectoris - Plan: EKG 12-Lead  Chronic atrial fibrillation ([place]) - Plan: EKG 12-Lead Rate relatively well-controlled on hisCurrent medication regiment Tolerating anticoagulation, eliquis   Cerebrovascular accident (CVA) due to embolism of middle cerebral artery, unspecified blood vessel laterality ([place]) - Plan: EKG 12-Lead On anticoagulation, no recent events  Chronic diastolic CHF (congestive heart failure) ([place]) - Plan: EKG 12-Lead Weight down to 182 pounds on his Home scale We have recommended that he decrease to torsemide dose down to 40 milligrams daily for weight of 182 pounds or less Would only take half dose metolazone for weight 190 pounds or  higher  Controlled type 2 diabetes mellitus with other circulatory complication, with long-term current use of insulin ([place]) - Plan: EKG 12-Lead Hemoglobin A1c dramatically improved  Centrilobular emphysema ([place]) - Plan: EKG 12-Lead With aggressive diuresis, shortness of breath has improved Currently not on oxygen.   Total encounter time more than 25 minutes  Greater than 50% was spent in counseling and coordination of care with the patient   Disposition:   F/U  6 months   Orders Placed This Encounter  Procedures  . EKG 12-Lead     Signed, Esmond Plants, M.D., Ph.D. 07/05/2016  [place], [place]

## 2016-07-05 ENCOUNTER — Encounter: Payer: Self-pay | Admitting: Cardiovascular Disease

## 2016-07-05 ENCOUNTER — Other Ambulatory Visit: Payer: Self-pay | Admitting: Internal Medicine

## 2016-07-05 ENCOUNTER — Ambulatory Visit (INDEPENDENT_AMBULATORY_CARE_PROVIDER_SITE_OTHER): Payer: Medicare Other | Admitting: Cardiovascular Disease

## 2016-07-05 VITALS — BP 108/58 | HR 69 | Ht 66.0 in | Wt 187.5 lb

## 2016-07-05 DIAGNOSIS — I5032 Chronic diastolic (congestive) heart failure: Secondary | ICD-10-CM

## 2016-07-05 DIAGNOSIS — E1159 Type 2 diabetes mellitus with other circulatory complications: Secondary | ICD-10-CM | POA: Diagnosis not present

## 2016-07-05 DIAGNOSIS — Z794 Long term (current) use of insulin: Secondary | ICD-10-CM | POA: Diagnosis not present

## 2016-07-05 DIAGNOSIS — J432 Centrilobular emphysema: Secondary | ICD-10-CM | POA: Diagnosis not present

## 2016-07-05 DIAGNOSIS — I482 Chronic atrial fibrillation, unspecified: Secondary | ICD-10-CM

## 2016-07-05 DIAGNOSIS — I2510 Atherosclerotic heart disease of native coronary artery without angina pectoris: Secondary | ICD-10-CM

## 2016-07-05 DIAGNOSIS — I63419 Cerebral infarction due to embolism of unspecified middle cerebral artery: Secondary | ICD-10-CM | POA: Diagnosis not present

## 2016-07-05 DIAGNOSIS — E782 Mixed hyperlipidemia: Secondary | ICD-10-CM | POA: Diagnosis not present

## 2016-07-05 DIAGNOSIS — I10 Essential (primary) hypertension: Secondary | ICD-10-CM

## 2016-07-05 NOTE — Patient Instructions (Addendum)
Medication Instructions:   No medication changes made  Hold the afternoon torsemide for weight 182 or less  Labwork:  No new labs needed  Testing/Procedures:  No further testing at this time   Follow-Up: It was a pleasure seeing you in the office today. Please call us if you have new issues that need to be addressed before your next appt.  [phone]  Your physician wants you to follow-up in: 6 months.  You will receive a reminder letter in the mail two months in advance. If you don't receive a letter, please call our office to schedule the follow-up appointment.  If you need a refill on your cardiac medications before your next appointment, please call your pharmacy.

## 2016-07-07 ENCOUNTER — Ambulatory Visit (INDEPENDENT_AMBULATORY_CARE_PROVIDER_SITE_OTHER): Payer: Medicare Other | Admitting: Family Medicine

## 2016-07-07 ENCOUNTER — Encounter: Payer: Self-pay | Admitting: Family Medicine

## 2016-07-07 VITALS — BP 100/70 | HR 67 | Temp 97.6°F | Ht 66.0 in | Wt 188.5 lb

## 2016-07-07 DIAGNOSIS — M25562 Pain in left knee: Secondary | ICD-10-CM | POA: Diagnosis not present

## 2016-07-07 DIAGNOSIS — M1712 Unilateral primary osteoarthritis, left knee: Secondary | ICD-10-CM

## 2016-07-07 MED ORDER — METHYLPREDNISOLONE ACETATE 40 MG/ML IJ SUSP
80.0000 mg | Freq: Once | INTRAMUSCULAR | Status: AC
  Administered 2016-07-07: 80 mg via INTRA_ARTICULAR

## 2016-07-07 MED ORDER — DICLOFENAC SODIUM 1 % TD GEL: 4.0000 g | Freq: Four times a day (QID) | TRANSDERMAL | 11 refills | Status: DC

## 2016-07-07 NOTE — Progress Notes (Signed)
Pre visit review using our clinic review tool, if applicable. No additional management support is needed unless otherwise documented below in the visit note. 

## 2016-07-07 NOTE — Progress Notes (Signed)
Dr. Frederico Hamman T. Daneil Beem, MD, [facility] [place], [address] Phone: [phone] Fax: [phone]  07/07/2016  Patient: Shawn Sternberg., MRN: [id number], DOB: [birth year], 83 y.o.  Primary Physician:  Webb Silversmith, NP   Chief Complaint  Patient presents with  . Knee Pain    Bilateral but Left Knee is worse   Subjective:   Shawn Heideman. is a 83 y.o. very pleasant male patient who presents with the following:  4-5 weeks ago, pain mostly in the left knee. Came and saw Rollene Fare for it and pred got a little bit better. Left is new and much worse. He is limping quite a bit with it. His mobility is decreased after he had a stroke. He is having more pain on the medial aspect of his knee, but also is having some anteriorly. There is been no distinct trauma.  On Eliquis.   Peripheral neuropathy.   Past Medical History, Surgical History, Social History, Family History, Problem List, Medications, and Allergies have been reviewed and updated if relevant.  Patient Active Problem List   Diagnosis Date Noted  . Diabetes type 2, controlled ([place]) 06/13/2015  . BPH (benign prostatic hyperplasia) 05/14/2014  . Anxiety 05/14/2014  . Chronic obstructive pulmonary disease ([place]) 05/14/2014  . Chronic diastolic CHF (congestive heart failure) ([place])   . Falls frequently 09/24/2013  . Insomnia 08/07/2013  . Hyperlipidemia 02/26/2009  . MITRAL REGURGITATION 02/26/2009  . Essential hypertension 02/26/2009  . MI 02/26/2009  . CAD (coronary artery disease) 02/26/2009  . ATRIAL FIBRILLATION 02/26/2009  . CVA (cerebral vascular accident) ([place]) 02/26/2009    Past Medical History:  Diagnosis Date  . Arthritis   . CAD    a. MI 01/29/1996 tx'd w/ TPA @ [facility]; b. Myoview 06/2005: EF 50%, scar @ apex, mild peri-infarct ischemia  . Cancer ([place])    skin  . Chronic atrial fibrillation ([place])    a. since 2006; b. on warfarin  . Chronic  diastolic CHF (congestive heart failure) ([place])    a. echo 04/2006: EF lower limits of nl, mod LVH, mild aortic root dilatation, & mild MR, biatrial enlargement; b. echo 04/2013: EF 60%, mod dilated LA, mild MR & TR, mod pulm HTN w/ RV systolic pressure 53, c. echo 04/21/14: EF 55-60%, unable to exclude WMA, severely dilated LA 6.6 cm, nl RVSP, mildly dilated aortic root  . COPD (chronic obstructive pulmonary disease) ([place])   . CVA [id number]   x2  . DM   . Falls   . GERD (gastroesophageal reflux disease)   . History of kidney stones   . HYPERLIPIDEMIA   . HYPERTENSION   . Kidney stone    a. s/p left ureteral stenting 04/24/14  . Neuropathy of both feet   . Poor balance     Past Surgical History:  Procedure Laterality Date  . BLADDER SURGERY     stent placement   . CARDIAC CATHETERIZATION  1997   [facility]  . CAROTID STENT INSERTION  1997  . CIRCUMCISION  2016  . CORONARY ANGIOPLASTY  1997   s/p stent placement x 2   . CYSTOSCOPY W/ URETERAL STENT REMOVAL Left 10/09/2014   Procedure: CYSTOSCOPY WITH STENT REMOVAL;  Surgeon: Hollice Espy, MD;  Location: [facility] ORS;  Service: Urology;  Laterality: Left;  . CYSTOSCOPY WITH STENT PLACEMENT Left 10/09/2014   Procedure: CYSTOSCOPY WITH STENT PLACEMENT;  Surgeon: Hollice Espy, MD;  Location: [facility] ORS;  Service: Urology;  Laterality: Left;  . KIDNEY SURGERY  05/2013   s/p stent placement   . stents ureters Bilateral   . TONSILLECTOMY AND ADENOIDECTOMY  1959  . URETEROSCOPY WITH HOLMIUM LASER LITHOTRIPSY Left 10/09/2014   Procedure: URETEROSCOPY WITH HOLMIUM LASER LITHOTRIPSY;  Surgeon: Hollice Espy, MD;  Location: [facility] ORS;  Service: Urology;  Laterality: Left;    Social History   Social History  . Marital status: Married    Spouse name: N/A  . Number of children: N/A  . Years of education: N/A   Occupational History  . Not on file.   Social History Main Topics  . Smoking status: Former Smoker    Packs/day: 2.00    Years: 40.00     Types: Cigarettes    Quit date: 05/24/1990  . Smokeless tobacco: Former Systems developer    Quit date: 05/24/1990  . Alcohol use No  . Drug use: No  . Sexual activity: Not Currently   Other Topics Concern  . Not on file   Social History Narrative  . No narrative on file    Family History  Problem Relation Age of Onset  . Heart disease Mother   . Heart disease Maternal Grandmother   . Diabetes Maternal Grandmother   . Cancer Neg Hx   . Stroke Neg Hx     Allergies  Allergen Reactions  . Contrast Media [Iodinated Diagnostic Agents] Shortness Of Breath  . Morphine And Related Other (See Comments)    Hallucinations   . Niacin And Related Dermatitis    Medication list reviewed and updated in full in [place].  GEN: No fevers, chills. Nontoxic. Primarily MSK c/o today. MSK: Detailed in the HPI GI: tolerating PO intake without difficulty Neuro: No numbness, parasthesias, or tingling associated. Otherwise the pertinent positives of the ROS are noted above.   Objective:   BP 100/70   Pulse 67   Temp 97.6 F (36.4 C) (Oral)   Ht '5\' 6"'$  (1.676 m)   Wt 188 lb 8 oz (85.5 kg)   BMI 30.42 kg/m    GEN: WDWN, NAD, Non-toxic, Alert & Oriented x 3 HEENT: Atraumatic, Normocephalic.  Ears and Nose: No external deformity. EXTR: No clubbing/cyanosis/tr edema NEURO: Normal gait.  PSYCH: Normally interactive. Conversant. Not depressed or anxious appearing.  Calm demeanor.    Left knee: Full extension. Flexion to 120. Mild effusion. Mild tenderness to loading of the medial and lateral patellar facet. Medial joint line is notably tender. Lateral joint line is less tender. Stable MCL and LCL. Anterior cruciate ligament and PCL are stable. Pain with McMurray's. Pain with flexion pinch.  Radiology: Dg Knee Ap/lat W/sunrise Left  Result Date: 06/25/2016 CLINICAL DATA:  Knee pain.  No injury. EXAM: LEFT KNEE 3 VIEWS COMPARISON:  12/11/2013 . FINDINGS: No acute bony or joint abnormality  identified. No evidence of fracture or dislocation. Tricompartment degenerative change. Peripheral vascular calcification. IMPRESSION: 1. No acute abnormality.  Tricompartment degenerative change. 2. Peripheral vascular disease . Electronically Signed   By: Marcello Moores  Register   On: 06/25/2016 10:52    Assessment and Plan:   Acute pain of left knee - Plan: methylPREDNISolone acetate (DEPO-MEDROL) injection 80 mg  Primary osteoarthritis of left knee  More likely acute degenerative meniscal tear on top of degenerative joint disease.  Knee Injection, L Patient verbally consented to procedure. Risks (including potential rare risk of infection), benefits, and alternatives explained. Sterilely prepped with Chloraprep. Ethyl cholride used for anesthesia. 8 cc Lidocaine 1%  mixed with 2 mL Depo-Medrol 40 mg injected using the anteromedial approach without difficulty. No complications with procedure and tolerated well. Patient had decreased pain post-injection.   Follow-up: if needed  Meds ordered this encounter  Medications  . diclofenac sodium (VOLTAREN) 1 % GEL    Sig: Apply 4 g topically 4 (four) times daily.    Dispense:  5 Tube    Refill:  11  . methylPREDNISolone acetate (DEPO-MEDROL) injection 80 mg   Signed,  Costella Schwarz T. Custer Pimenta, MD   Allergies as of 07/07/2016      Reactions   Contrast Media [iodinated Diagnostic Agents] Shortness Of Breath   Morphine And Related Other (See Comments)   Hallucinations   Niacin And Related Dermatitis      Medication List       Accurate as of 07/07/16  2:53 PM. Always use your most recent med list.          acetaminophen 650 MG CR tablet Commonly known as:  TYLENOL Take 650 mg by mouth every 8 (eight) hours as needed.   albuterol (2.5 MG/3ML) 0.083% NEBU 3 mL, albuterol (5 MG/ML) 0.5% NEBU 0.5 mL Inhale 1 mg into the lungs.   PROAIR HFA 108 (90 Base) MCG/ACT inhaler Generic drug:  albuterol USE 1 TO 2 INHALATIONS EVERY 4 HOURS AS NEEDED    ALPRAZolam 0.25 MG tablet Commonly known as:  XANAX Take 1 tablet (0.25 mg total) by mouth at bedtime as needed for anxiety.   atorvastatin 40 MG tablet Commonly known as:  LIPITOR TAKE 1 TABLET DAILY   BD PEN NEEDLE NANO U/F 32G X 4 MM Misc Generic drug:  Insulin Pen Needle USE THREE TIMES A DAY FOR INSULIN ADMINISTRATION   budesonide-formoterol 160-4.5 MCG/ACT inhaler Commonly known as:  SYMBICORT Inhale 2 puffs into the lungs 2 (two) times daily.   citalopram 10 MG tablet Commonly known as:  CELEXA TAKE 1 TABLET DAILY   diclofenac sodium 1 % Gel Commonly known as:  VOLTAREN Apply 4 g topically 4 (four) times daily.   dorzolamide 2 % ophthalmic solution Commonly known as:  TRUSOPT Place 1 drop into both eyes 3 (three) times daily.   ELIQUIS 5 MG Tabs tablet Generic drug:  apixaban TAKE 1 TABLET TWICE A DAY   ezetimibe 10 MG tablet Commonly known as:  ZETIA TAKE 1 TABLET DAILY   finasteride 5 MG tablet Commonly known as:  PROSCAR TAKE 1 TABLET DAILY   Fish Oil 1000 MG Caps Take 1 capsule by mouth 3 (three) times daily.   fluticasone 50 MCG/ACT nasal spray Commonly known as:  FLONASE Place into both nostrils daily.   gabapentin 100 MG capsule Commonly known as:  NEURONTIN TAKE 1 CAPSULE THREE TIMES A DAY   glipiZIDE 10 MG tablet Commonly known as:  GLUCOTROL TAKE 1 TABLET TWICE A DAY BEFORE MEALS   HUMALOG KWIKPEN 100 UNIT/ML KiwkPen Generic drug:  insulin lispro INJECT 18 UNITS WITH BREAKFAST, 20 UNITS WITH LUNCH, AND 16 UNITS WITH DINNER   Insulin Syringe-Needle U-100 30G X 1/2" 1 ML Misc 1 each by Does not apply route 3 (three) times daily.   latanoprost 0.005 % ophthalmic solution Commonly known as:  XALATAN Place 1 drop into both eyes at bedtime.   LEVEMIR FLEXPEN 100 UNIT/ML Pen Generic drug:  Insulin Detemir Inject 42 Units into the skin daily at 10 pm.   metolazone 5 MG tablet Commonly known as:  ZAROXOLYN TAKE 1 TABLET DAILY AS  NEEDED  metoprolol succinate 50 MG 24 hr tablet Commonly known as:  TOPROL-XL TAKE 1 TABLET TWICE A DAY   nitroGLYCERIN 0.4 MG SL tablet Commonly known as:  NITROSTAT Place 0.4 mg under the tongue every 5 (five) minutes as needed.   nystatin-triamcinolone cream Commonly known as:  MYCOLOG II Apply 1 application topically 2 (two) times daily.   Potassium Chloride ER 20 MEQ Tbcr Take 40 mEq by mouth daily.   sitaGLIPtin 100 MG tablet Commonly known as:  JANUVIA Take 1 tablet (100 mg total) by mouth daily.   tamsulosin 0.4 MG Caps capsule Commonly known as:  FLOMAX TAKE 1 CAPSULE DAILY   tiotropium 18 MCG inhalation capsule Commonly known as:  SPIRIVA HANDIHALER Place 1 capsule (18 mcg total) into inhaler and inhale daily.   torsemide 20 MG tablet Commonly known as:  DEMADEX Take 60 mg by mouth daily.

## 2016-07-09 ENCOUNTER — Telehealth: Payer: Self-pay | Admitting: Cardiovascular Disease

## 2016-07-09 NOTE — Telephone Encounter (Signed)
Spoke w/ pt.  He misunderstood instructions given to him at last ov to hold afternoon torsemide for wt > 182. He has not been taking afternoon dose at all and his wt has increased to 190. He will resume BID dosing of torsemide. Advised him to limit his fluids and only drink when he is thirsty.  He reports that his meds make him thirsty all the time - he will try to use ice chips instead of water to avoid the volume. Asked him to call back if his wt does not start to come back down.

## 2016-07-09 NOTE — Telephone Encounter (Signed)
Nurse with [facility] states pt has gained 8 pounds since Monday when pt was in office, she states Dr. Rockey Situ adjusted some of pt medications. She request we call patient.

## 2016-07-12 ENCOUNTER — Telehealth: Payer: Self-pay | Admitting: *Deleted

## 2016-07-12 ENCOUNTER — Encounter: Payer: Self-pay | Admitting: Family Medicine

## 2016-07-12 NOTE — Telephone Encounter (Signed)
done

## 2016-07-12 NOTE — Telephone Encounter (Signed)
Received fax from [facility] requesting PA for Diclofenac Gel 1%.  Form placed in Dr. Lillie Fragmin in box to complete.

## 2016-07-12 NOTE — Telephone Encounter (Signed)
Completed PA form faxed to [facility] at [phone].  Awaiting decision.

## 2016-07-13 NOTE — Telephone Encounter (Signed)
PA for Diclofenac Gel 1% was denied.

## 2016-07-15 MED ORDER — DICLOFENAC SODIUM 1.5 % TD SOLN: 40.0000 [drp] | Freq: Four times a day (QID) | TRANSDERMAL | 5 refills | Status: DC

## 2016-07-15 NOTE — Telephone Encounter (Signed)
yes

## 2016-07-15 NOTE — Telephone Encounter (Signed)
Looks like insurance will cover Diclofenaxc 1.5% solution.  Ok to send to pharmacy?  Patient is aware of change.

## 2016-07-15 NOTE — Addendum Note (Signed)
Addended by: Carter Kitten on: 07/15/2016 04:43 PM   Modules accepted: Orders

## 2016-07-15 NOTE — Addendum Note (Signed)
Addended by: Carter Kitten on: 07/15/2016 12:41 PM   Modules accepted: Orders

## 2016-07-17 ENCOUNTER — Other Ambulatory Visit: Payer: Self-pay | Admitting: Internal Medicine

## 2016-07-26 ENCOUNTER — Encounter: Payer: Self-pay | Admitting: Internal Medicine

## 2016-07-26 ENCOUNTER — Ambulatory Visit (INDEPENDENT_AMBULATORY_CARE_PROVIDER_SITE_OTHER): Payer: Medicare Other | Admitting: Internal Medicine

## 2016-07-26 DIAGNOSIS — I482 Chronic atrial fibrillation, unspecified: Secondary | ICD-10-CM

## 2016-07-26 DIAGNOSIS — I63419 Cerebral infarction due to embolism of unspecified middle cerebral artery: Secondary | ICD-10-CM | POA: Diagnosis not present

## 2016-07-26 DIAGNOSIS — E1159 Type 2 diabetes mellitus with other circulatory complications: Secondary | ICD-10-CM | POA: Diagnosis not present

## 2016-07-26 DIAGNOSIS — N401 Enlarged prostate with lower urinary tract symptoms: Secondary | ICD-10-CM | POA: Diagnosis not present

## 2016-07-26 DIAGNOSIS — E782 Mixed hyperlipidemia: Secondary | ICD-10-CM

## 2016-07-26 DIAGNOSIS — I5032 Chronic diastolic (congestive) heart failure: Secondary | ICD-10-CM

## 2016-07-26 DIAGNOSIS — R351 Nocturia: Secondary | ICD-10-CM

## 2016-07-26 DIAGNOSIS — F5104 Psychophysiologic insomnia: Secondary | ICD-10-CM | POA: Diagnosis not present

## 2016-07-26 DIAGNOSIS — I10 Essential (primary) hypertension: Secondary | ICD-10-CM

## 2016-07-26 DIAGNOSIS — J432 Centrilobular emphysema: Secondary | ICD-10-CM | POA: Diagnosis not present

## 2016-07-26 DIAGNOSIS — I2510 Atherosclerotic heart disease of native coronary artery without angina pectoris: Secondary | ICD-10-CM

## 2016-07-26 DIAGNOSIS — F419 Anxiety disorder, unspecified: Secondary | ICD-10-CM

## 2016-07-26 DIAGNOSIS — Z794 Long term (current) use of insulin: Secondary | ICD-10-CM | POA: Diagnosis not present

## 2016-07-26 NOTE — Progress Notes (Signed)
Subjective:    Patient ID: [name]., male    DOB: 11/23/1931, 83 y.o.   MRN: [id number]  HPI  Pt presents to the clinic today to follow up chronic conditions.  Afib: He reports he can feel palpitations intermittently but has not noticed his heart racing. He is taking Metoprolol and Eliquis as prescribed. He follows with Dr. Rockey Situ- note from 06/2016 reviewed. ECG from 06/2016 reviewed.  HLD with CAD s/p MI: His last LDL was 61, 07/2015. He is taking Lipitor, Zetia and Fish Oil as prescribed. He denies chest pain but has Nitro to take if needed. He has tried to consume a low fat diet. He follows with Dr. Rockey Situ- note from 06/2016 reviewed.  Chronic Diastolic CHF: He does c/o lower extremity edema and shortness of breath with exertion but denies chest pain. He is taking Metoprolol, Torsemide, Metolazone and Potassium Chloride as prescribed. He monitors his weight daily, and reports his weight is down today. Echo from 04/2014 reviewed. He follows with Dr. Gaspar Cola from 06/2016 reviewed.  History of CVA: No residual effect. He is on Metoprolol and Eliquis.  HTN: His BP is 106/74. He is not taking any medications specifically for HTN, but is on Metoprolol, Torsemide and Metolazone.  COPD: He no longer smokes. He does c/o shortness of breath with exertion. He does feel like his symptoms are controlled on Symbicort and Spiriva. He reports he uses his Albuterol inhaler every morning.  DM 2: His last A1C was 6.5%, 05/2016. He is taking Levemir, Humalog, Glipizide and Januvia as prescribed. He has not been checking his sugar as regularly as he previously had. He checks his feet daily. His neuropathy is controlled with Neurontin. He has had an eye exam in the last year. His flu and pneumonia vaccines are UTD.  BPH: He voids fine on Finasteride and Flomax.  Anxiety and Insomnia: Triggered by his chronic health issues and the fact that he can not do things he wants to do. He takes Celexa daily  and Xanax every night before bed. He reports this helps him to fall asleep easier.  He also reports a skin tear to his left forearm. He reports he tripped, and his left arm hit the door know. His wife cleaned the skin tear, and covered it with Neosporin, gauze and tape. They have not noticed in redness, warmth or pus draining from the area.  Review of Systems      Past Medical History:  Diagnosis Date  . Arthritis   . CAD    a. MI 01/29/1996 tx'd w/ TPA @ [facility]; b. Myoview 06/2005: EF 50%, scar @ apex, mild peri-infarct ischemia  . Cancer ([place])    skin  . Chronic atrial fibrillation ([place])    a. since 2006; b. on warfarin  . Chronic diastolic CHF (congestive heart failure) ([place])    a. echo 04/2006: EF lower limits of nl, mod LVH, mild aortic root dilatation, & mild MR, biatrial enlargement; b. echo 04/2013: EF 60%, mod dilated LA, mild MR & TR, mod pulm HTN w/ RV systolic pressure 53, c. echo 04/21/14: EF 55-60%, unable to exclude WMA, severely dilated LA 6.6 cm, nl RVSP, mildly dilated aortic root  . COPD (chronic obstructive pulmonary disease) ([place])   . CVA 5732,2025   x2  . DM   . Falls   . GERD (gastroesophageal reflux disease)   . History of kidney stones   . HYPERLIPIDEMIA   . HYPERTENSION   . Kidney stone  a. s/p left ureteral stenting 04/24/14  . Neuropathy of both feet   . Poor balance     Current Outpatient Prescriptions  Medication Sig Dispense Refill  . acetaminophen (TYLENOL) 650 MG CR tablet Take 650 mg by mouth every 8 (eight) hours as needed.      Marland Kitchen albuterol (2.5 MG/3ML) 0.083% NEBU 3 mL, albuterol (5 MG/ML) 0.5% NEBU 0.5 mL Inhale 1 mg into the lungs.    . ALPRAZolam (XANAX) 0.25 MG tablet Take 1 tablet (0.25 mg total) by mouth at bedtime as needed for anxiety. 90 tablet 0  . atorvastatin (LIPITOR) 40 MG tablet TAKE 1 TABLET DAILY 90 tablet 3  . BD PEN NEEDLE NANO U/F 32G X 4 MM MISC USE THREE TIMES A DAY FOR INSULIN ADMINISTRATION 270 each 1  .  budesonide-formoterol (SYMBICORT) 160-4.5 MCG/ACT inhaler Inhale 2 puffs into the lungs 2 (two) times daily. 3 Inhaler 3  . citalopram (CELEXA) 10 MG tablet TAKE 1 TABLET DAILY 90 tablet 1  . Diclofenac Sodium 1.5 % SOLN Place 2 mLs onto the skin 4 (four) times daily. 150 mL 5  . dorzolamide (TRUSOPT) 2 % ophthalmic solution Place 1 drop into both eyes 3 (three) times daily.     Marland Kitchen ELIQUIS 5 MG TABS tablet TAKE 1 TABLET TWICE A DAY 180 tablet 0  . ezetimibe (ZETIA) 10 MG tablet TAKE 1 TABLET DAILY 90 tablet 1  . finasteride (PROSCAR) 5 MG tablet TAKE 1 TABLET DAILY 90 tablet 4  . fluticasone (FLONASE) 50 MCG/ACT nasal spray Place into both nostrils daily.    Marland Kitchen gabapentin (NEURONTIN) 100 MG capsule TAKE 1 CAPSULE THREE TIMES A DAY 270 capsule 1  . glipiZIDE (GLUCOTROL) 10 MG tablet TAKE 1 TABLET TWICE A DAY BEFORE MEALS 180 tablet 0  . HUMALOG KWIKPEN 100 UNIT/ML KiwkPen INJECT 18 UNITS WITH BREAKFAST, 20 UNITS WITH LUNCH, AND 16 UNITS WITH DINNER 60 mL 0  . Insulin Detemir (LEVEMIR FLEXPEN) 100 UNIT/ML Pen Inject 42 Units into the skin daily at 10 pm.    . Insulin Syringe-Needle U-100 30G X 1/2" 1 ML MISC 1 each by Does not apply route 3 (three) times daily. 200 each 5  . latanoprost (XALATAN) 0.005 % ophthalmic solution Place 1 drop into both eyes at bedtime.     . metolazone (ZAROXOLYN) 5 MG tablet TAKE 1 TABLET DAILY AS NEEDED 90 tablet 2  . metoprolol succinate (TOPROL-XL) 50 MG 24 hr tablet TAKE 1 TABLET TWICE A DAY 180 tablet 2  . nitroGLYCERIN (NITROSTAT) 0.4 MG SL tablet Place 0.4 mg under the tongue every 5 (five) minutes as needed.      . nystatin-triamcinolone (MYCOLOG II) cream Apply 1 application topically 2 (two) times daily.     . Omega-3 Fatty Acids (FISH OIL) 1000 MG CAPS Take 1 capsule by mouth 3 (three) times daily.     . Potassium Chloride ER 20 MEQ TBCR Take 40 mEq by mouth daily. 180 tablet 3  . PROAIR HFA 108 (90 Base) MCG/ACT inhaler USE 1 TO 2 INHALATIONS EVERY 4 HOURS AS  NEEDED 25.5 g 4  . sitaGLIPtin (JANUVIA) 100 MG tablet Take 1 tablet (100 mg total) by mouth daily. 90 tablet 1  . tamsulosin (FLOMAX) 0.4 MG CAPS capsule TAKE 1 CAPSULE DAILY 90 capsule 1  . tiotropium (SPIRIVA HANDIHALER) 18 MCG inhalation capsule Place 1 capsule (18 mcg total) into inhaler and inhale daily. 90 capsule 3  . torsemide (DEMADEX) 20 MG tablet Take 60  mg by mouth daily.      No current facility-administered medications for this visit.     Allergies  Allergen Reactions  . Contrast Media [Iodinated Diagnostic Agents] Shortness Of Breath  . Morphine And Related Other (See Comments)    Hallucinations   . Niacin And Related Dermatitis    Family History  Problem Relation Age of Onset  . Heart disease Mother   . Heart disease Maternal Grandmother   . Diabetes Maternal Grandmother   . Cancer Neg Hx   . Stroke Neg Hx     Social History   Social History  . Marital status: Married    Spouse name: N/A  . Number of children: N/A  . Years of education: N/A   Occupational History  . Not on file.   Social History Main Topics  . Smoking status: Former Smoker    Packs/day: 2.00    Years: 40.00    Types: Cigarettes    Quit date: 05/24/1990  . Smokeless tobacco: Former Systems developer    Quit date: 05/24/1990  . Alcohol use No  . Drug use: No  . Sexual activity: Not Currently   Other Topics Concern  . Not on file   Social History Narrative  . No narrative on file     Constitutional: Pt reports fatigue. Denies fever, malaise, headache or abrupt weight changes.  HEENT: Denies eye pain, eye redness, ear pain, ringing in the ears, wax buildup, runny nose, nasal congestion, bloody nose, or sore throat. Respiratory: Pt reports shortness of breath with exertion. Denies difficulty breathing, cough or sputum production.   Cardiovascular: Pt reports swelling in his legs. Denies chest pain, chest tightness, palpitations or swelling in the hands.  Gastrointestinal: Pt reports  constipation. Denies abdominal pain, bloating, diarrhea or blood in the stool.  GU: Pt reports nocturia. Denies urgency, frequency, pain with urination, burning sensation, blood in urine, odor or discharge. Musculoskeletal: Pt reports right shoulder pain. Denies decrease in range of motion, difficulty with gait, muscle pain or joint swelling.  Skin: Pt reports skin tear to left forearm Denies redness, rashes, lesions or ulcercations.  Neurological: Pt reports difficulty with balance. Denies dizziness, difficulty with memory, difficulty with speech or problems with coordination.  Psych: Pt has history of anxiety. Denies depression, SI/HI.  No other specific complaints in a complete review of systems (except as listed in HPI above).  Objective:   Physical Exam   BP 106/74   Pulse 69   Temp 97.5 F (36.4 C) (Oral)   Wt 184 lb 8 oz (83.7 kg)   SpO2 98%   BMI 29.78 kg/m  Wt Readings from Last 3 Encounters:  07/26/16 184 lb 8 oz (83.7 kg)  07/07/16 188 lb 8 oz (85.5 kg)  07/05/16 187 lb 8 oz (85 kg)    General: Appears his stated age, chronically ill appearing, in NAD. Skin: Warm, dry and intact. 3 cm crescent shaped skin tear noted of left forearm.  HEENT: Head: normal shape and size; Eyes: sclera injected, no icterus, conjunctiva pink; Ears: Tm's gray and intact, normal light reflex;  Throat/Mouth: Teeth present, mucosa pink and moist, no exudate, lesions or ulcerations noted.  Cardiovascular: Normal rate with irregular rhythm. Trace BLE edema. No carotid bruits noted. Pulmonary/Chest: Normal effort and positive vesicular breath sounds. Decreased inspiratory phase. No respiratory distress. No wheezes, rales or ronchi noted.  Abdomen: Soft and nontender. Normal bowel sounds. No distention or masses noted.  Musculoskeletal: Gait slow but steady. Using cane for  assistance with gait. Neurological: Alert and oriented.  Psychiatric: Mood and affect normal. Behavior is normal. Judgment and  thought content normal.    BMET    Component Value Date/Time   NA 141 05/20/2016 1041   NA 143 02/21/2015 1108   NA 137 08/05/2014 1839   K 3.5 05/20/2016 1041   K 3.6 08/05/2014 1839   CL 104 05/20/2016 1041   CL 106 08/05/2014 1839   CO2 33 (H) 05/20/2016 1041   CO2 23 08/05/2014 1839   GLUCOSE 89 05/20/2016 1041   GLUCOSE 235 (H) 08/05/2014 1839   BUN 34 (H) 05/20/2016 1041   BUN 26 02/21/2015 1108   BUN 19 08/05/2014 1839   CREATININE 1.52 (H) 05/20/2016 1041   CREATININE 1.20 08/05/2014 1839   CALCIUM 9.2 05/20/2016 1041   CALCIUM 8.6 (L) 08/05/2014 1839   GFRNONAA 41 (L) 02/21/2015 1108   GFRNONAA 57 (L) 08/05/2014 1839   GFRAA 48 (L) 02/21/2015 1108   GFRAA >60 08/05/2014 1839    Lipid Panel     Component Value Date/Time   CHOL 120 08/05/2015 1135   TRIG 208.0 (H) 08/05/2015 1135   HDL 32.10 (L) 08/05/2015 1135   CHOLHDL 4 08/05/2015 1135   VLDL 41.6 (H) 08/05/2015 1135   LDLCALC 47 10/04/2014 0940    CBC    Component Value Date/Time   WBC 14.7 (H) 05/20/2016 1041   RBC 5.17 05/20/2016 1041   HGB 14.4 05/20/2016 1041   HGB 13.3 08/05/2014 1839   HCT 43.0 05/20/2016 1041   HCT 40.1 08/05/2014 1839   PLT 218.0 05/20/2016 1041   PLT 246 08/05/2014 1839   MCV 83.2 05/20/2016 1041   MCV 83 08/05/2014 1839   MCH 26.1 10/09/2014 2249   MCHC 33.5 05/20/2016 1041   RDW 16.3 (H) 05/20/2016 1041   RDW 16.3 (H) 08/05/2014 1839   LYMPHSABS 3.3 05/20/2016 1041   LYMPHSABS 2.3 08/05/2014 1839   MONOABS 1.7 (H) 05/20/2016 1041   MONOABS 2.0 (H) 08/05/2014 1839   EOSABS 0.2 05/20/2016 1041   EOSABS 0.1 08/05/2014 1839   BASOSABS 0.1 05/20/2016 1041   BASOSABS 0.1 08/05/2014 1839    Hgb A1C Lab Results  Component Value Date   HGBA1C 6.5 05/13/2016            Assessment & Plan:

## 2016-07-27 NOTE — Assessment & Plan Note (Signed)
Secondary to anxiety Continue Celexa and Xanax

## 2016-07-27 NOTE — Assessment & Plan Note (Signed)
Stable Continue Symbicort, Spiriva and Albuterol

## 2016-07-27 NOTE — Assessment & Plan Note (Signed)
A1C and Lipid profile today Encouraged him to consume a low fat, low cholesterol diet Microalbumin not needed secondary to ACEI therapy Continue Glipizide, Januvia, Levemir and Humalog- will adjust if needed based on labs Encouraged him to continue yearly eye exams Foot exam today

## 2016-07-27 NOTE — Assessment & Plan Note (Signed)
CMET and Lipid Profile today Encouraged him to consume a low fat diet Continue Lipitor, Zetia and Fish Oil for now

## 2016-07-27 NOTE — Assessment & Plan Note (Signed)
Chronic Continue Metoprolol and Eliquis He will continue to follow with Dr. Rockey Situ CBC today

## 2016-07-27 NOTE — Assessment & Plan Note (Signed)
No residual effect Continue Metoprolol, Lipitor and Eliquis CBC and Lipid profile today

## 2016-07-27 NOTE — Assessment & Plan Note (Signed)
Compensated Continue to weight yourself daily Continue Torsemide, Metolazone, Metoprolol, Monopril and Potassium Supplement CMET today He will continue to follow with Dr. Rockey Situ

## 2016-07-27 NOTE — Patient Instructions (Signed)
Fat and Cholesterol Restricted Diet Getting too much fat and cholesterol in your diet may cause health problems. Following this diet helps keep your fat and cholesterol at normal levels. This can keep you from getting sick. What types of fat should I choose?  Choose monosaturated and polyunsaturated fats. These are found in foods such as olive oil, canola oil, flaxseeds, walnuts, almonds, and seeds.  Eat more omega-3 fats. Good choices include salmon, mackerel, sardines, tuna, flaxseed oil, and ground flaxseeds.  Limit saturated fats. These are in animal products such as meats, butter, and cream. They can also be in plant products such as palm oil, palm kernel oil, and coconut oil.  Avoid foods with partially hydrogenated oils in them. These contain trans fats. Examples of foods that have trans fats are stick margarine, some tub margarines, cookies, crackers, and other baked goods. What general guidelines do I need to follow?  Check food labels. Look for the words "trans fat" and "saturated fat."  When preparing a meal:  Fill half of your plate with vegetables and green salads.  Fill one fourth of your plate with whole grains. Look for the word "whole" as the first word in the ingredient list.  Fill one fourth of your plate with lean protein foods.  Eat more foods that have fiber, like apples, carrots, beans, peas, and barley.  Eat more home-cooked foods. Eat less at restaurants and buffets.  Limit or avoid alcohol.  Limit foods high in starch and sugar.  Limit fried foods.  Cook foods without frying them. Baking, boiling, grilling, and broiling are all great options.  Lose weight if you are overweight. Losing even a small amount of weight can help your overall health. It can also help prevent diseases such as diabetes and heart disease. What foods can I eat? Grains  Whole grains, such as whole wheat or whole grain breads, crackers, cereals, and pasta. Unsweetened oatmeal,  bulgur, barley, quinoa, or brown rice. Corn or whole wheat flour tortillas. Vegetables  Fresh or frozen vegetables (raw, steamed, roasted, or grilled). Green salads. Fruits  All fresh, canned (in natural juice), or frozen fruits. Meat and Other Protein Products  Ground beef (85% or leaner), grass-fed beef, or beef trimmed of fat. Skinless chicken or turkey. Ground chicken or turkey. Pork trimmed of fat. All fish and seafood. Eggs. Dried beans, peas, or lentils. Unsalted nuts or seeds. Unsalted canned or dry beans. Dairy  Low-fat dairy products, such as skim or 1% milk, 2% or reduced-fat cheeses, low-fat ricotta or cottage cheese, or plain low-fat yogurt. Fats and Oils  Tub margarines without trans fats. Light or reduced-fat mayonnaise and salad dressings. Avocado. Olive, canola, sesame, or safflower oils. Natural peanut or almond butter (choose ones without added sugar and oil). The items listed above may not be a complete list of recommended foods or beverages. Contact your dietitian for more options.  What foods are not recommended? Grains  White bread. White pasta. White rice. Cornbread. Bagels, pastries, and croissants. Crackers that contain trans fat. Vegetables  White potatoes. Corn. Creamed or fried vegetables. Vegetables in a cheese sauce. Fruits  Dried fruits. Canned fruit in light or heavy syrup. Fruit juice. Meat and Other Protein Products  Fatty cuts of meat. Ribs, chicken wings, bacon, sausage, bologna, salami, chitterlings, fatback, hot dogs, bratwurst, and packaged luncheon meats. Liver and organ meats. Dairy  Whole or 2% milk, cream, half-and-half, and cream cheese. Whole milk cheeses. Whole-fat or sweetened yogurt. Full-fat cheeses. Nondairy creamers and whipped   toppings. Processed cheese, cheese spreads, or cheese curds. Sweets and Desserts  Corn syrup, sugars, honey, and molasses. Candy. Jam and jelly. Syrup. Sweetened cereals. Cookies, pies, cakes, donuts, muffins, and ice  cream. Fats and Oils  Butter, stick margarine, lard, shortening, ghee, or bacon fat. Coconut, palm kernel, or palm oils. Beverages  Alcohol. Sweetened drinks (such as sodas, lemonade, and fruit drinks or punches). The items listed above may not be a complete list of foods and beverages to avoid. Contact your dietitian for more information.  This information is not intended to replace advice given to you by your health care provider. Make sure you discuss any questions you have with your health care provider. Document Released: 09/28/2011 Document Revised: 12/04/2015 Document Reviewed: 06/28/2013 Elsevier Interactive Patient Education  2017 Elsevier Inc.  

## 2016-07-27 NOTE — Assessment & Plan Note (Signed)
Continue Flomax and Finaseteride

## 2016-07-27 NOTE — Assessment & Plan Note (Signed)
No angina CMET and Lipid profile today Continue Lipitor, Metoprolol and Eliquis He will continue to follow with Dr. Rockey Situ He has Nitro if needed

## 2016-07-27 NOTE — Assessment & Plan Note (Signed)
Chronic but stable on Celexa and Xanax

## 2016-07-27 NOTE — Assessment & Plan Note (Signed)
On the low end Will monitor

## 2016-07-28 ENCOUNTER — Other Ambulatory Visit: Payer: Self-pay | Admitting: Internal Medicine

## 2016-08-09 ENCOUNTER — Other Ambulatory Visit: Payer: Self-pay | Admitting: Internal Medicine

## 2016-08-09 DIAGNOSIS — E7800 Pure hypercholesterolemia, unspecified: Secondary | ICD-10-CM

## 2016-08-10 LAB — HM DIABETES EYE EXAM

## 2016-08-11 ENCOUNTER — Telehealth: Payer: Self-pay | Admitting: Cardiovascular Disease

## 2016-08-11 ENCOUNTER — Other Ambulatory Visit: Payer: Self-pay | Admitting: Internal Medicine

## 2016-08-11 NOTE — Telephone Encounter (Signed)
Left voicemail message with readings that she requested with instructions to call back if any questions.

## 2016-08-11 NOTE — Telephone Encounter (Signed)
Nurse with [facility] asking if she can get Pt last BP reading and HR   Please call back

## 2016-08-12 ENCOUNTER — Encounter: Payer: Self-pay | Admitting: Internal Medicine

## 2016-08-17 ENCOUNTER — Other Ambulatory Visit: Payer: Self-pay

## 2016-08-17 ENCOUNTER — Other Ambulatory Visit (INDEPENDENT_AMBULATORY_CARE_PROVIDER_SITE_OTHER): Payer: Medicare Other

## 2016-08-17 DIAGNOSIS — E7800 Pure hypercholesterolemia, unspecified: Secondary | ICD-10-CM | POA: Diagnosis not present

## 2016-08-17 DIAGNOSIS — R7989 Other specified abnormal findings of blood chemistry: Secondary | ICD-10-CM

## 2016-08-17 LAB — COMPREHENSIVE METABOLIC PANEL
ALBUMIN: 3.4 g/dL — AB (ref 3.5–5.2)
ALT: 8 U/L (ref 0–53)
AST: 9 U/L (ref 0–37)
Alkaline Phosphatase: 53 U/L (ref 39–117)
BILIRUBIN TOTAL: 0.6 mg/dL (ref 0.2–1.2)
BUN: 38 mg/dL — AB (ref 6–23)
CALCIUM: 9.3 mg/dL (ref 8.4–10.5)
CO2: 35 mEq/L — ABNORMAL HIGH (ref 19–32)
CREATININE: 1.62 mg/dL — AB (ref 0.40–1.50)
Chloride: 98 mEq/L (ref 96–112)
GFR: 43.5 mL/min — ABNORMAL LOW (ref >60.00)
Glucose, Bld: 188 mg/dL — ABNORMAL HIGH (ref 70–99)
POTASSIUM: 3.1 meq/L — AB (ref 3.5–5.1)
SODIUM: 139 meq/L (ref 135–145)
TOTAL PROTEIN: 6.7 g/dL (ref 6.0–8.3)

## 2016-08-17 LAB — CBC
HEMATOCRIT: 43.2 % (ref 39.0–52.0)
Hemoglobin: 14.7 g/dL (ref 13.0–17.0)
MCHC: 34 g/dL (ref 30.0–36.0)
MCV: 83.2 fl (ref 78.0–100.0)
PLATELETS: 242 10*3/uL (ref 150.0–400.0)
RBC: 5.2 Mil/uL (ref 4.22–5.81)
RDW: 17 % — ABNORMAL HIGH (ref 11.5–15.5)
WBC: 15.5 10*3/uL — AB (ref 4.0–10.5)

## 2016-08-17 LAB — TSH: TSH: 5.87 u[IU]/mL — ABNORMAL HIGH (ref 0.35–4.50)

## 2016-08-17 LAB — HEMOGLOBIN A1C: Hgb A1c MFr Bld: 7.5 % — ABNORMAL HIGH (ref 4.6–6.5)

## 2016-08-17 LAB — T4, FREE: Free T4: 0.74 ng/dL (ref 0.60–1.60)

## 2016-08-17 MED ORDER — INSULIN DETEMIR 100 UNIT/ML FLEXPEN: 42.0000 [IU] | PEN_INJECTOR | Freq: Every day | SUBCUTANEOUS | 1 refills | Status: DC

## 2016-08-17 NOTE — Telephone Encounter (Signed)
Pt received most recent refill for levemir flexpen; instructions 28 units at 10 pm; pt has been taking 42 units at 10 pm; I spoke with Avie Echevaria NP and she wants pt to be taking 42 units at 10 pm. [facility] and he cancelled the refill left on rx for levemir 28 units at 10 pm instruction. Venora Maples said pt was on auto refill and to get the new rx on auto refill would need to give rx to pharmacist. Damaris Schooner with [facility] Updated med list and apologized to pt and will chg instructions and 90 day qty. Also spoke with Leroy Sea to get this rx on auto refill as the previous levemir was on auto refill.Brad said since refill was cancelled he could not add auto refill; I explained again that the new rx was what I want to add the auto refill to. I asked to speak with supervisor; spoke with Tammy and she added the auto refill to new rx.  Pt voiced understanding and if pt has problems getting auto refill on time pt will call [place] back.pt appreciative. [facility] took 34 mins and 43 seconds.

## 2016-08-19 MED ORDER — LEVOTHYROXINE SODIUM 25 MCG PO TABS: 25.0000 ug | ORAL_TABLET | Freq: Every day | ORAL | 0 refills | Status: DC

## 2016-08-19 MED ORDER — POTASSIUM CHLORIDE ER 20 MEQ PO TBCR: 60.0000 meq | EXTENDED_RELEASE_TABLET | Freq: Every day | ORAL | 0 refills | Status: DC

## 2016-08-19 NOTE — Addendum Note (Signed)
Addended by: Lurlean Nanny on: 08/19/2016 05:16 PM   Modules accepted: Orders

## 2016-08-26 ENCOUNTER — Other Ambulatory Visit: Payer: Self-pay | Admitting: Pulmonary Disease

## 2016-08-30 ENCOUNTER — Ambulatory Visit: Payer: Medicare Other | Admitting: Family Medicine

## 2016-08-31 ENCOUNTER — Other Ambulatory Visit: Payer: Self-pay | Admitting: Pulmonary Disease

## 2016-09-01 NOTE — Telephone Encounter (Signed)
Called patient to make him aware office visit needed for re-evalulation if he wants to restart Prednisone 5 mg. Patient says he will wait until next follow up. Patient made aware that if he needs [place] before the please call back to schedule appointment.

## 2016-09-01 NOTE — Telephone Encounter (Signed)
Follow up with Dr Alva Garnet for re-evaluation of steroids

## 2016-09-01 NOTE — Telephone Encounter (Signed)
Please advise on refill. Per 05/27/16 ov patient was completely off Prednisone.

## 2016-09-03 ENCOUNTER — Telehealth: Payer: Self-pay

## 2016-09-03 NOTE — Telephone Encounter (Signed)
Nurse with [facility]; pt is in [facility] CHF program and pt advised nurse that last A1C was 7.3. Wants confirmation of last A1C including date. On 08/17/16 A1C was 7.5. Nurse with [facility] notified via confidential  v/m.

## 2016-09-12 ENCOUNTER — Other Ambulatory Visit: Payer: Self-pay | Admitting: Cardiovascular Disease

## 2016-09-13 ENCOUNTER — Ambulatory Visit (INDEPENDENT_AMBULATORY_CARE_PROVIDER_SITE_OTHER): Payer: Medicare Other | Admitting: Family Medicine

## 2016-09-13 ENCOUNTER — Encounter: Payer: Self-pay | Admitting: Family Medicine

## 2016-09-13 VITALS — BP 126/67 | HR 62 | Temp 97.5°F | Ht 66.0 in | Wt 184.8 lb

## 2016-09-13 DIAGNOSIS — Z794 Long term (current) use of insulin: Secondary | ICD-10-CM | POA: Diagnosis not present

## 2016-09-13 DIAGNOSIS — I5032 Chronic diastolic (congestive) heart failure: Secondary | ICD-10-CM | POA: Diagnosis not present

## 2016-09-13 DIAGNOSIS — M1712 Unilateral primary osteoarthritis, left knee: Secondary | ICD-10-CM | POA: Diagnosis not present

## 2016-09-13 DIAGNOSIS — M25562 Pain in left knee: Secondary | ICD-10-CM | POA: Diagnosis not present

## 2016-09-13 DIAGNOSIS — E1159 Type 2 diabetes mellitus with other circulatory complications: Secondary | ICD-10-CM

## 2016-09-13 DIAGNOSIS — I63419 Cerebral infarction due to embolism of unspecified middle cerebral artery: Secondary | ICD-10-CM | POA: Diagnosis not present

## 2016-09-13 MED ORDER — PREDNISONE 2.5 MG PO TABS: ORAL_TABLET | ORAL | 0 refills | Status: DC

## 2016-09-13 NOTE — Progress Notes (Signed)
Dr. Frederico Hamman T. Rosaleen Mazer, MD, [facility] [place], [address] Phone: [phone] Fax: [phone]  09/13/2016  Patient: Shawn Litt., MRN: [id number], DOB: 1931-06-05, 83 y.o.  Primary Physician:  Jearld Fenton, NP   Chief Complaint  Patient presents with  . Knee Pain    Left   Subjective:   Shawn Cherian. is a 83 y.o. very pleasant male patient who presents with the following:  F/u L knee pain: last seen 07/07/2016. X-rays at that time showed tricompartmental arthritis, and additionally the patient had some relatively acute medial joint line pain that was concern for possible generative meniscal tear.  3-4 weeks doing well, then had pain in the posterior of knee. Took prednisone and now is down to 10 mg a day. He has been doing this over a month, feels quite a bit better now.  Now when getting up, the L knee will kill him.   Past Medical History, Surgical History, Social History, Family History, Problem List, Medications, and Allergies have been reviewed and updated if relevant.  Patient Active Problem List   Diagnosis Date Noted  . Diabetes type 2, controlled ([place]) 06/13/2015  . BPH (benign prostatic hyperplasia) 05/14/2014  . Anxiety 05/14/2014  . Chronic obstructive pulmonary disease ([place]) 05/14/2014  . Chronic diastolic CHF (congestive heart failure) ([place])   . Falls frequently 09/24/2013  . Insomnia 08/07/2013  . Hyperlipidemia 02/26/2009  . MITRAL REGURGITATION 02/26/2009  . Essential hypertension 02/26/2009  . MI 02/26/2009  . CAD (coronary artery disease) 02/26/2009  . ATRIAL FIBRILLATION 02/26/2009  . CVA (cerebral vascular accident) ([place]) 02/26/2009    Past Medical History:  Diagnosis Date  . Arthritis   . CAD    a. MI 01/29/1996 tx'd w/ TPA @ [place]; b. Myoview 06/2005: EF 50%, scar @ apex, mild peri-infarct ischemia  . Cancer ([place])    skin  . Chronic atrial fibrillation ([place])    a.  since 2006; b. on warfarin  . Chronic diastolic CHF (congestive heart failure) ([place])    a. echo 04/2006: EF lower limits of nl, mod LVH, mild aortic root dilatation, & mild MR, biatrial enlargement; b. echo 04/2013: EF 60%, mod dilated LA, mild MR & TR, mod pulm HTN w/ RV systolic pressure 53, c. echo 04/21/14: EF 55-60%, unable to exclude WMA, severely dilated LA 6.6 cm, nl RVSP, mildly dilated aortic root  . COPD (chronic obstructive pulmonary disease) ([place])   . CVA 8101,7510   x2  . DM   . Falls   . GERD (gastroesophageal reflux disease)   . History of kidney stones   . HYPERLIPIDEMIA   . HYPERTENSION   . Kidney stone    a. s/p left ureteral stenting 04/24/14  . Neuropathy of both feet   . Poor balance     Past Surgical History:  Procedure Laterality Date  . BLADDER SURGERY     stent placement   . CARDIAC CATHETERIZATION  1997   [facility]  . CAROTID STENT INSERTION  1997  . CIRCUMCISION  2016  . CORONARY ANGIOPLASTY  1997   s/p stent placement x 2   . CYSTOSCOPY W/ URETERAL STENT REMOVAL Left 10/09/2014   Procedure: CYSTOSCOPY WITH STENT REMOVAL;  Surgeon: Hollice Espy, MD;  Location: [facility] ORS;  Service: Urology;  Laterality: Left;  . CYSTOSCOPY WITH STENT PLACEMENT Left 10/09/2014   Procedure: CYSTOSCOPY WITH STENT PLACEMENT;  Surgeon: Hollice Espy, MD;  Location:  [facility] ORS;  Service: Urology;  Laterality: Left;  . KIDNEY SURGERY  05/2013   s/p stent placement   . stents ureters Bilateral   . TONSILLECTOMY AND ADENOIDECTOMY  1959  . URETEROSCOPY WITH HOLMIUM LASER LITHOTRIPSY Left 10/09/2014   Procedure: URETEROSCOPY WITH HOLMIUM LASER LITHOTRIPSY;  Surgeon: Hollice Espy, MD;  Location: [facility] ORS;  Service: Urology;  Laterality: Left;    Social History   Social History  . Marital status: Married    Spouse name: N/A  . Number of children: N/A  . Years of education: N/A   Occupational History  . Not on file.   Social History Main Topics  . Smoking status: Former Smoker     Packs/day: 2.00    Years: 40.00    Types: Cigarettes    Quit date: 05/24/1990  . Smokeless tobacco: Former Systems developer    Quit date: 05/24/1990  . Alcohol use No  . Drug use: No  . Sexual activity: Not Currently   Other Topics Concern  . Not on file   Social History Narrative  . No narrative on file    Family History  Problem Relation Age of Onset  . Heart disease Mother   . Heart disease Maternal Grandmother   . Diabetes Maternal Grandmother   . Cancer Neg Hx   . Stroke Neg Hx     Allergies  Allergen Reactions  . Contrast Media [Iodinated Diagnostic Agents] Shortness Of Breath  . Morphine And Related Other (See Comments)    Hallucinations   . Niacin And Related Dermatitis    Medication list reviewed and updated in full in [facility].  GEN: No fevers, chills. Nontoxic. Primarily MSK c/o today. MSK: Detailed in the HPI GI: tolerating PO intake without difficulty Neuro: No numbness, parasthesias, or tingling associated. Otherwise the pertinent positives of the ROS are noted above.   Objective:   BP 126/67   Pulse 62   Temp 97.5 F (36.4 C) (Oral)   Ht 5\' 6"  (1.676 m)   Wt 184 lb 12 oz (83.8 kg)   BMI 29.82 kg/m    GEN: WDWN, NAD, Non-toxic, A & O x 3 HEENT: Atraumatic, Normocephalic. Neck supple. No masses, No LAD. Ears and Nose: No external deformity. CV: RRR, No M/G/R. No JVD. No thrill. No extra heart sounds. PULM: CTA B, no wheezes, crackles, rhonchi. No retractions. No resp. distress. No accessory muscle use. EXTR: No c/c/e NEURO Normal gait.  PSYCH: Normally interactive. Conversant. Not depressed or anxious appearing.  Calm demeanor.    Mild limp. Lacks 2 of extension. Flexion to 115. Pain at the medial and lateral patellar facet is mild. Mild medial joint line tenderness. Stable MCL, LCL, anterior cruciate ligament, and PCL. Flexion pinch causes pain. McMurray's is negative.  Radiology: Dg Knee Ap/lat W/sunrise Left  Result Date:  06/25/2016 CLINICAL DATA:  Knee pain.  No injury. EXAM: LEFT KNEE 3 VIEWS COMPARISON:  12/11/2013 . FINDINGS: No acute bony or joint abnormality identified. No evidence of fracture or dislocation. Tricompartment degenerative change. Peripheral vascular calcification. IMPRESSION: 1. No acute abnormality.  Tricompartment degenerative change. 2. Peripheral vascular disease . Electronically Signed   By: Marcello Moores  Register   On: 06/25/2016 10:52   Assessment and Plan:   Primary osteoarthritis of left knee  Acute pain of left knee  Cerebrovascular accident (CVA) due to embolism of middle cerebral artery, unspecified blood vessel laterality (H[facility])  Chronic diastolic CHF (congestive heart failure) ([place])  Controlled type 2 diabetes mellitus with  other circulatory complication, with long-term current use of insulin ([place])  Level of Medical Decision-Making in this case is Moderate.   Known degenerative disease of the left knee. He is doing fairly well now after starting himself on prednisone that he had at home. This was secondary to COPD.  Wean the patient off of steroids over one month. Reviewed potential complications of long-term steroid use.  For now, we will hold off on any other intervention. He would be a good candidate for hyaluronic acid injections.  Follow-up: if needed  Meds ordered this encounter  Medications  . predniSONE (DELTASONE) 2.5 MG tablet    Sig: 2 tabs po for 14 days, then 1 tab po for 14 days    Dispense:  42 tablet    Refill:  0    Signed,  Yanique Mulvihill T. Kennadee Walthour, MD   Allergies as of 09/13/2016      Reactions   Contrast Media [iodinated Diagnostic Agents] Shortness Of Breath   Morphine And Related Other (See Comments)   Hallucinations   Niacin And Related Dermatitis      Medication List       Accurate as of 09/13/16 12:48 PM. Always use your most recent med list.          acetaminophen 650 MG CR tablet Commonly known as:  TYLENOL Take 650 mg by mouth every 8  (eight) hours as needed.   albuterol (2.5 MG/3ML) 0.083% NEBU 3 mL, albuterol (5 MG/ML) 0.5% NEBU 0.5 mL Inhale 1 mg into the lungs.   PROAIR HFA 108 (90 Base) MCG/ACT inhaler Generic drug:  albuterol USE 1 TO 2 INHALATIONS EVERY 4 HOURS AS NEEDED   ALPRAZolam 0.25 MG tablet Commonly known as:  XANAX Take 1 tablet (0.25 mg total) by mouth at bedtime as needed for anxiety.   atorvastatin 40 MG tablet Commonly known as:  LIPITOR TAKE 1 TABLET DAILY   BD PEN NEEDLE NANO U/F 32G X 4 MM Misc Generic drug:  Insulin Pen Needle USE THREE TIMES A DAY FOR INSULIN ADMINISTRATION   budesonide-formoterol 160-4.5 MCG/ACT inhaler Commonly known as:  SYMBICORT Inhale 2 puffs into the lungs 2 (two) times daily.   SYMBICORT 160-4.5 MCG/ACT inhaler Generic drug:  budesonide-formoterol USE 2 INHALATIONS TWICE A DAY   citalopram 10 MG tablet Commonly known as:  CELEXA TAKE 1 TABLET DAILY   Diclofenac Sodium 1.5 % Soln Place 2 mLs onto the skin 4 (four) times daily.   dorzolamide 2 % ophthalmic solution Commonly known as:  TRUSOPT Place 1 drop into both eyes 3 (three) times daily.   ELIQUIS 5 MG Tabs tablet Generic drug:  apixaban TAKE 1 TABLET TWICE A DAY   ezetimibe 10 MG tablet Commonly known as:  ZETIA TAKE 1 TABLET DAILY   finasteride 5 MG tablet Commonly known as:  PROSCAR TAKE 1 TABLET DAILY   Fish Oil 1000 MG Caps Take 1 capsule by mouth 3 (three) times daily.   fluticasone 50 MCG/ACT nasal spray Commonly known as:  FLONASE Place into both nostrils daily.   gabapentin 100 MG capsule Commonly known as:  NEURONTIN TAKE 1 CAPSULE THREE TIMES A DAY   glipiZIDE 10 MG tablet Commonly known as:  GLUCOTROL TAKE 1 TABLET TWICE A DAY BEFORE MEALS   HUMALOG KWIKPEN 100 UNIT/ML KiwkPen Generic drug:  insulin lispro INJECT 18 UNITS WITH BREAKFAST, 20 UNITS WITH LUNCH, AND 16 UNITS WITH DINNER   Insulin Detemir 100 UNIT/ML Pen Commonly known as:  LEVEMIR FLEXPEN Inject  42  Units into the skin daily at 10 pm.   Insulin Syringe-Needle U-100 30G X 1/2" 1 ML Misc 1 each by Does not apply route 3 (three) times daily.   JANUVIA 100 MG tablet Generic drug:  sitaGLIPtin TAKE 1 TABLET DAILY   latanoprost 0.005 % ophthalmic solution Commonly known as:  XALATAN Place 1 drop into both eyes at bedtime.   levothyroxine 25 MCG tablet Commonly known as:  SYNTHROID, LEVOTHROID Take 1 tablet (25 mcg total) by mouth daily before breakfast.   metolazone 5 MG tablet Commonly known as:  ZAROXOLYN TAKE 1 TABLET DAILY AS NEEDED   metoprolol succinate 50 MG 24 hr tablet Commonly known as:  TOPROL-XL TAKE 1 TABLET TWICE A DAY   nitroGLYCERIN 0.4 MG SL tablet Commonly known as:  NITROSTAT Place 0.4 mg under the tongue every 5 (five) minutes as needed.   nystatin-triamcinolone cream Commonly known as:  MYCOLOG II Apply 1 application topically 2 (two) times daily.   Potassium Chloride ER 20 MEQ Tbcr Take 60 mEq by mouth daily.   predniSONE 2.5 MG tablet Commonly known as:  DELTASONE 2 tabs po for 14 days, then 1 tab po for 14 days   tamsulosin 0.4 MG Caps capsule Commonly known as:  FLOMAX TAKE 1 CAPSULE DAILY   tiotropium 18 MCG inhalation capsule Commonly known as:  SPIRIVA HANDIHALER Place 1 capsule (18 mcg total) into inhaler and inhale daily.   torsemide 20 MG tablet Commonly known as:  DEMADEX Take 60 mg by mouth daily.

## 2016-09-16 ENCOUNTER — Ambulatory Visit: Payer: Medicare Other | Admitting: Family

## 2016-09-18 ENCOUNTER — Other Ambulatory Visit: Payer: Self-pay | Admitting: Cardiovascular Disease

## 2016-09-18 ENCOUNTER — Other Ambulatory Visit: Payer: Self-pay | Admitting: Internal Medicine

## 2016-10-03 ENCOUNTER — Other Ambulatory Visit: Payer: Self-pay | Admitting: Internal Medicine

## 2016-10-04 ENCOUNTER — Other Ambulatory Visit (INDEPENDENT_AMBULATORY_CARE_PROVIDER_SITE_OTHER): Payer: Medicare Other

## 2016-10-04 DIAGNOSIS — R946 Abnormal results of thyroid function studies: Secondary | ICD-10-CM | POA: Diagnosis not present

## 2016-10-04 DIAGNOSIS — R7989 Other specified abnormal findings of blood chemistry: Secondary | ICD-10-CM

## 2016-10-04 LAB — TSH: TSH: 6.9 u[IU]/mL — ABNORMAL HIGH (ref 0.35–4.50)

## 2016-10-05 NOTE — Addendum Note (Signed)
Addended by: Lurlean Nanny on: 10/05/2016 01:54 PM   Modules accepted: Orders

## 2016-10-06 ENCOUNTER — Encounter: Payer: Self-pay | Admitting: Internal Medicine

## 2016-10-07 ENCOUNTER — Other Ambulatory Visit: Payer: Self-pay | Admitting: Internal Medicine

## 2016-10-07 MED ORDER — PREDNISONE 10 MG PO TABS: ORAL_TABLET | ORAL | 0 refills | Status: DC

## 2016-10-10 ENCOUNTER — Inpatient Hospital Stay
Admission: EM | Admit: 2016-10-10 | Discharge: 2016-10-11 | DRG: 683 | Disposition: A | Discharge status: Home Health | Payer: Medicare Other | Attending: Internal Medicine | Admitting: Internal Medicine

## 2016-10-10 ENCOUNTER — Emergency Department: Payer: Medicare Other

## 2016-10-10 ENCOUNTER — Encounter: Payer: Self-pay | Admitting: Emergency Medicine

## 2016-10-10 DIAGNOSIS — F419 Anxiety disorder, unspecified: Secondary | ICD-10-CM | POA: Diagnosis present

## 2016-10-10 DIAGNOSIS — I252 Old myocardial infarction: Secondary | ICD-10-CM

## 2016-10-10 DIAGNOSIS — G629 Polyneuropathy, unspecified: Secondary | ICD-10-CM | POA: Diagnosis present

## 2016-10-10 DIAGNOSIS — N179 Acute kidney failure, unspecified: Principal | ICD-10-CM | POA: Diagnosis present

## 2016-10-10 DIAGNOSIS — J449 Chronic obstructive pulmonary disease, unspecified: Secondary | ICD-10-CM | POA: Diagnosis present

## 2016-10-10 DIAGNOSIS — N183 Chronic kidney disease, stage 3 (moderate): Secondary | ICD-10-CM | POA: Diagnosis present

## 2016-10-10 DIAGNOSIS — S51011A Laceration without foreign body of right elbow, initial encounter: Secondary | ICD-10-CM | POA: Diagnosis present

## 2016-10-10 DIAGNOSIS — Z91041 Radiographic dye allergy status: Secondary | ICD-10-CM

## 2016-10-10 DIAGNOSIS — Y92009 Unspecified place in unspecified non-institutional (private) residence as the place of occurrence of the external cause: Secondary | ICD-10-CM

## 2016-10-10 DIAGNOSIS — R531 Weakness: Secondary | ICD-10-CM | POA: Diagnosis present

## 2016-10-10 DIAGNOSIS — Z79899 Other long term (current) drug therapy: Secondary | ICD-10-CM

## 2016-10-10 DIAGNOSIS — I2510 Atherosclerotic heart disease of native coronary artery without angina pectoris: Secondary | ICD-10-CM | POA: Diagnosis present

## 2016-10-10 DIAGNOSIS — Z87442 Personal history of urinary calculi: Secondary | ICD-10-CM

## 2016-10-10 DIAGNOSIS — W01190A Fall on same level from slipping, tripping and stumbling with subsequent striking against furniture, initial encounter: Secondary | ICD-10-CM | POA: Diagnosis present

## 2016-10-10 DIAGNOSIS — I5032 Chronic diastolic (congestive) heart failure: Secondary | ICD-10-CM | POA: Diagnosis present

## 2016-10-10 DIAGNOSIS — I130 Hypertensive heart and chronic kidney disease with heart failure and stage 1 through stage 4 chronic kidney disease, or unspecified chronic kidney disease: Secondary | ICD-10-CM | POA: Diagnosis present

## 2016-10-10 DIAGNOSIS — R9431 Abnormal electrocardiogram [ECG] [EKG]: Secondary | ICD-10-CM

## 2016-10-10 DIAGNOSIS — E785 Hyperlipidemia, unspecified: Secondary | ICD-10-CM | POA: Diagnosis present

## 2016-10-10 DIAGNOSIS — R7989 Other specified abnormal findings of blood chemistry: Secondary | ICD-10-CM

## 2016-10-10 DIAGNOSIS — Z7901 Long term (current) use of anticoagulants: Secondary | ICD-10-CM

## 2016-10-10 DIAGNOSIS — K219 Gastro-esophageal reflux disease without esophagitis: Secondary | ICD-10-CM | POA: Diagnosis present

## 2016-10-10 DIAGNOSIS — R296 Repeated falls: Secondary | ICD-10-CM | POA: Diagnosis present

## 2016-10-10 DIAGNOSIS — Z8249 Family history of ischemic heart disease and other diseases of the circulatory system: Secondary | ICD-10-CM

## 2016-10-10 DIAGNOSIS — Z955 Presence of coronary angioplasty implant and graft: Secondary | ICD-10-CM

## 2016-10-10 DIAGNOSIS — E876 Hypokalemia: Secondary | ICD-10-CM | POA: Diagnosis present

## 2016-10-10 DIAGNOSIS — E1140 Type 2 diabetes mellitus with diabetic neuropathy, unspecified: Secondary | ICD-10-CM | POA: Diagnosis present

## 2016-10-10 DIAGNOSIS — Z8673 Personal history of transient ischemic attack (TIA), and cerebral infarction without residual deficits: Secondary | ICD-10-CM | POA: Diagnosis not present

## 2016-10-10 DIAGNOSIS — Z87891 Personal history of nicotine dependence: Secondary | ICD-10-CM

## 2016-10-10 DIAGNOSIS — S2220XA Unspecified fracture of sternum, initial encounter for closed fracture: Secondary | ICD-10-CM | POA: Diagnosis present

## 2016-10-10 DIAGNOSIS — I482 Chronic atrial fibrillation: Secondary | ICD-10-CM | POA: Diagnosis present

## 2016-10-10 DIAGNOSIS — R778 Other specified abnormalities of plasma proteins: Secondary | ICD-10-CM

## 2016-10-10 DIAGNOSIS — Z85828 Personal history of other malignant neoplasm of skin: Secondary | ICD-10-CM

## 2016-10-10 DIAGNOSIS — Z7951 Long term (current) use of inhaled steroids: Secondary | ICD-10-CM | POA: Diagnosis not present

## 2016-10-10 DIAGNOSIS — E1122 Type 2 diabetes mellitus with diabetic chronic kidney disease: Secondary | ICD-10-CM | POA: Diagnosis present

## 2016-10-10 DIAGNOSIS — R918 Other nonspecific abnormal finding of lung field: Secondary | ICD-10-CM

## 2016-10-10 DIAGNOSIS — Z794 Long term (current) use of insulin: Secondary | ICD-10-CM | POA: Diagnosis not present

## 2016-10-10 DIAGNOSIS — Z885 Allergy status to narcotic agent status: Secondary | ICD-10-CM

## 2016-10-10 LAB — CBC
HCT: 45 % (ref 40.0–52.0)
Hemoglobin: 15.5 g/dL (ref 13.0–18.0)
MCH: 28.6 pg (ref 26.0–34.0)
MCHC: 34.4 g/dL (ref 32.0–36.0)
MCV: 83.1 fL (ref 80.0–100.0)
Platelets: 272 10*3/uL (ref 150–440)
RBC: 5.41 MIL/uL (ref 4.40–5.90)
RDW: 16.7 % — AB (ref 11.5–14.5)
WBC: 18.1 10*3/uL — ABNORMAL HIGH (ref 3.8–10.6)

## 2016-10-10 LAB — BASIC METABOLIC PANEL
Anion gap: 12 (ref 5–15)
BUN: 48 mg/dL — AB (ref 6–20)
CALCIUM: 9.1 mg/dL (ref 8.9–10.3)
CO2: 32 mmol/L (ref 22–32)
CREATININE: 1.87 mg/dL — AB (ref 0.61–1.24)
Chloride: 93 mmol/L — ABNORMAL LOW (ref 101–111)
GFR calc Af Amer: 37 mL/min — ABNORMAL LOW (ref >60)
GFR, EST NON AFRICAN AMERICAN: 32 mL/min — AB (ref >60)
GLUCOSE: 147 mg/dL — AB (ref 65–99)
Potassium: 2.5 mmol/L — CL (ref 3.5–5.1)
Sodium: 137 mmol/L (ref 135–145)

## 2016-10-10 LAB — GLUCOSE, CAPILLARY
GLUCOSE-CAPILLARY: 269 mg/dL — AB (ref 65–99)
GLUCOSE-CAPILLARY: 234 mg/dL — AB (ref 65–99)

## 2016-10-10 LAB — TROPONIN I
TROPONIN I: 0.03 ng/mL — AB (ref <0.03)
Troponin I: 0.03 ng/mL (ref <0.03)

## 2016-10-10 LAB — MAGNESIUM: MAGNESIUM: 2.2 mg/dL (ref 1.7–2.4)

## 2016-10-10 MED ORDER — FINASTERIDE 5 MG PO TABS
5.0000 mg | ORAL_TABLET | Freq: Every day | ORAL | Status: DC
  Administered 2016-10-11: 5 mg via ORAL
  Filled 2016-10-10: qty 1

## 2016-10-10 MED ORDER — LATANOPROST 0.005 % OP SOLN
1.0000 [drp] | Freq: Every day | OPHTHALMIC | Status: DC
  Administered 2016-10-10: 1 [drp] via OPHTHALMIC
  Filled 2016-10-10 (×2): qty 2.5

## 2016-10-10 MED ORDER — POTASSIUM CHLORIDE CRYS ER 20 MEQ PO TBCR
40.0000 meq | EXTENDED_RELEASE_TABLET | Freq: Once | ORAL | Status: AC
  Administered 2016-10-10: 40 meq via ORAL
  Filled 2016-10-10: qty 2

## 2016-10-10 MED ORDER — METOPROLOL SUCCINATE ER 50 MG PO TB24
50.0000 mg | ORAL_TABLET | Freq: Two times a day (BID) | ORAL | Status: DC
  Administered 2016-10-10 – 2016-10-11 (×2): 50 mg via ORAL
  Filled 2016-10-10 (×2): qty 1

## 2016-10-10 MED ORDER — ACETAMINOPHEN 650 MG RE SUPP: 650.0000 mg | Freq: Four times a day (QID) | RECTAL | Status: DC | PRN

## 2016-10-10 MED ORDER — TIOTROPIUM BROMIDE MONOHYDRATE 18 MCG IN CAPS
1.0000 | ORAL_CAPSULE | Freq: Every day | RESPIRATORY_TRACT | Status: DC
  Administered 2016-10-10 – 2016-10-11 (×2): 18 ug via RESPIRATORY_TRACT
  Filled 2016-10-10: qty 5

## 2016-10-10 MED ORDER — INSULIN DETEMIR 100 UNIT/ML ~~LOC~~ SOLN
42.0000 [IU] | Freq: Every day | SUBCUTANEOUS | Status: DC
Start: 2016-10-10 — End: 2016-10-11
  Administered 2016-10-10: 42 [IU] via SUBCUTANEOUS
  Filled 2016-10-10 (×2): qty 0.42

## 2016-10-10 MED ORDER — GABAPENTIN 100 MG PO CAPS
100.0000 mg | ORAL_CAPSULE | Freq: Three times a day (TID) | ORAL | Status: DC
  Administered 2016-10-10 – 2016-10-11 (×3): 100 mg via ORAL
  Filled 2016-10-10 (×3): qty 1

## 2016-10-10 MED ORDER — POTASSIUM CHLORIDE IN NACL 40-0.9 MEQ/L-% IV SOLN
INTRAVENOUS | Status: DC
  Administered 2016-10-10: 75 mL/h via INTRAVENOUS
  Filled 2016-10-10 (×3): qty 1000

## 2016-10-10 MED ORDER — TAMSULOSIN HCL 0.4 MG PO CAPS
0.4000 mg | ORAL_CAPSULE | Freq: Every day | ORAL | Status: DC
  Administered 2016-10-11: 0.4 mg via ORAL
  Filled 2016-10-10: qty 1

## 2016-10-10 MED ORDER — DORZOLAMIDE HCL 2 % OP SOLN
1.0000 [drp] | Freq: Three times a day (TID) | OPHTHALMIC | Status: DC
  Administered 2016-10-11: 1 [drp] via OPHTHALMIC
  Filled 2016-10-10 (×2): qty 10

## 2016-10-10 MED ORDER — INSULIN ASPART 100 UNIT/ML ~~LOC~~ SOLN
0.0000 [IU] | Freq: Three times a day (TID) | SUBCUTANEOUS | Status: DC
  Administered 2016-10-10: 5 [IU] via SUBCUTANEOUS
  Administered 2016-10-11: 2 [IU] via SUBCUTANEOUS
  Filled 2016-10-10 (×2): qty 1

## 2016-10-10 MED ORDER — EZETIMIBE 10 MG PO TABS
10.0000 mg | ORAL_TABLET | Freq: Every day | ORAL | Status: DC
  Administered 2016-10-11: 10 mg via ORAL
  Filled 2016-10-10: qty 1

## 2016-10-10 MED ORDER — ALBUTEROL SULFATE (2.5 MG/3ML) 0.083% IN NEBU
2.5000 mg | INHALATION_SOLUTION | RESPIRATORY_TRACT | Status: DC | PRN
Start: 2016-10-10 — End: 2016-10-11

## 2016-10-10 MED ORDER — APIXABAN 5 MG PO TABS
5.0000 mg | ORAL_TABLET | Freq: Two times a day (BID) | ORAL | Status: DC
  Administered 2016-10-10 – 2016-10-11 (×2): 5 mg via ORAL
  Filled 2016-10-10 (×2): qty 1

## 2016-10-10 MED ORDER — POTASSIUM CHLORIDE 10 MEQ/100ML IV SOLN
10.0000 meq | Freq: Once | INTRAVENOUS | Status: AC
Start: 2016-10-10 — End: 2016-10-10
  Administered 2016-10-10: 10 meq via INTRAVENOUS
  Filled 2016-10-10: qty 100

## 2016-10-10 MED ORDER — ATORVASTATIN CALCIUM 20 MG PO TABS
40.0000 mg | ORAL_TABLET | Freq: Every day | ORAL | Status: DC
  Administered 2016-10-11: 40 mg via ORAL
  Filled 2016-10-10: qty 2

## 2016-10-10 MED ORDER — SODIUM CHLORIDE 0.9 % IV SOLN
Freq: Once | INTRAVENOUS | Status: AC
  Administered 2016-10-10: 13:00:00 via INTRAVENOUS

## 2016-10-10 MED ORDER — SENNOSIDES-DOCUSATE SODIUM 8.6-50 MG PO TABS: 1.0000 | ORAL_TABLET | Freq: Every evening | ORAL | Status: DC | PRN

## 2016-10-10 MED ORDER — ALPRAZOLAM 0.25 MG PO TABS: 0.2500 mg | ORAL_TABLET | Freq: Every evening | ORAL | Status: DC | PRN

## 2016-10-10 MED ORDER — INSULIN ASPART 100 UNIT/ML ~~LOC~~ SOLN
0.0000 [IU] | Freq: Every day | SUBCUTANEOUS | Status: DC
  Administered 2016-10-10: 2 [IU] via SUBCUTANEOUS
  Filled 2016-10-10: qty 1

## 2016-10-10 MED ORDER — DICLOFENAC SODIUM 1.5 % TD SOLN
40.0000 [drp] | Freq: Four times a day (QID) | TRANSDERMAL | Status: DC
Start: 2016-10-10 — End: 2016-10-10

## 2016-10-10 MED ORDER — HYDROCODONE-ACETAMINOPHEN 5-325 MG PO TABS
1.0000 | ORAL_TABLET | Freq: Four times a day (QID) | ORAL | Status: DC | PRN
  Administered 2016-10-10 – 2016-10-11 (×3): 1 via ORAL
  Filled 2016-10-10 (×3): qty 1

## 2016-10-10 MED ORDER — ACETAMINOPHEN 325 MG PO TABS: 650.0000 mg | ORAL_TABLET | Freq: Four times a day (QID) | ORAL | Status: DC | PRN

## 2016-10-10 MED ORDER — HYDROCODONE-ACETAMINOPHEN 5-325 MG PO TABS
1.0000 | ORAL_TABLET | Freq: Once | ORAL | Status: AC
  Administered 2016-10-10: 1 via ORAL
  Filled 2016-10-10: qty 1

## 2016-10-10 MED ORDER — NITROGLYCERIN 0.4 MG SL SUBL: 0.4000 mg | SUBLINGUAL_TABLET | SUBLINGUAL | Status: DC | PRN

## 2016-10-10 MED ORDER — BISACODYL 5 MG PO TBEC: 5.0000 mg | DELAYED_RELEASE_TABLET | Freq: Every day | ORAL | Status: DC | PRN

## 2016-10-10 MED ORDER — MOMETASONE FURO-FORMOTEROL FUM 200-5 MCG/ACT IN AERO
2.0000 | INHALATION_SPRAY | Freq: Two times a day (BID) | RESPIRATORY_TRACT | Status: DC
  Administered 2016-10-10 – 2016-10-11 (×2): 2 via RESPIRATORY_TRACT
  Filled 2016-10-10: qty 8.8

## 2016-10-10 MED ORDER — LEVOTHYROXINE SODIUM 50 MCG PO TABS
50.0000 ug | ORAL_TABLET | Freq: Every day | ORAL | Status: DC
  Administered 2016-10-11: 50 ug via ORAL
  Filled 2016-10-10: qty 1

## 2016-10-10 MED ORDER — POTASSIUM CHLORIDE CRYS ER 20 MEQ PO TBCR: 60.0000 meq | EXTENDED_RELEASE_TABLET | Freq: Every day | ORAL | Status: DC

## 2016-10-10 NOTE — ED Notes (Signed)
This Rn to bedside at this time. Explained and apologized for delay. Pt states understanding. Pt's son remains at bedside at this time. Will continue to monitor for further patient needs at this time.

## 2016-10-10 NOTE — ED Notes (Signed)
Pt's wound to R elbow dressed with vaseline gauze and telfa dressing and wrapped. No bleeding noted at this time.

## 2016-10-10 NOTE — ED Triage Notes (Addendum)
Pt arrived via POV from home, states he got up to go to the bathroom. States he has been having problems with his knees and isn't sure if his knees gave out or not. Pt states he fell into the nightstand chest first and hit his forehead near his eyebrows. Pt states it knocked the breath out of him and was hard to talk shortly after the fall. Pt c/o difficulty taking a deep breath without having pain. Denies any LOC. Pt has bruising on chest and eye brows. Pt is on Elliquis for afib.

## 2016-10-10 NOTE — ED Notes (Signed)
Pt taken to  CT at this time. Explained would dress his wound and medicate upon his return. Pt states understanding at this time.

## 2016-10-10 NOTE — ED Notes (Signed)
Date and time results received: 10/10/16 8:26 AM   Test: Trop, K+ Critical Value: Trop 0.03, K+ 2.5  Name of Provider Notified: Dr. Jacqualine Code  Orders Received? Or Actions Taken?: Critical Result Acknowledged.

## 2016-10-10 NOTE — ED Notes (Signed)
NAD noted at this time. Pt resting in bed with family at bedside. No change in patient condition. Will continue to monitor for further patient needs.

## 2016-10-10 NOTE — H&P (Addendum)
[facility] NAME: [name]    MR#:  [id number]  DATE OF BIRTH:  [birth year]  DATE OF ADMISSION:  10/10/2016  PRIMARY CARE PHYSICIAN: Jearld Fenton, NP   REQUESTING/REFERRING PHYSICIAN: Delman Kitten, MD  CHIEF COMPLAINT:   Chief Complaint  Patient presents with  . Fall  . Chest Pain   Fall, weakness and chest pain today. HISTORY OF PRESENT ILLNESS:  [name]  is a 83 y.o. male with a known history of CAD, arthritis, chronic A. fib, chronic diastolic CHF, COPD, diabetes, CVA, hypertension and hyperlipidemia. The patient is sent to ED due to worsening generalized weakness and fall today. The patient reports that he's got up this morning and felt like his legs give way. He fell and strike his head and the chest on the dresser. He complained of chest pain for follow-up. He denies any syncope or loss of consciousness. He was found potassium 2.5. CT is negative for obvious large acute trauma, though a nondisplaced sternal fracture is noted.   PAST MEDICAL HISTORY:   Past Medical History:  Diagnosis Date  . Arthritis   . CAD    a. MI 01/29/1996 tx'd w/ TPA @ [facility]; b. Myoview 06/2005: EF 50%, scar @ apex, mild peri-infarct ischemia  . Cancer ([place])    skin  . Chronic atrial fibrillation ([place])    a. since 2006; b. on warfarin  . Chronic diastolic CHF (congestive heart failure) ([place])    a. echo 04/2006: EF lower limits of nl, mod LVH, mild aortic root dilatation, & mild MR, biatrial enlargement; b. echo 04/2013: EF 60%, mod dilated LA, mild MR & TR, mod pulm HTN w/ RV systolic pressure 53, c. echo 04/21/14: EF 55-60%, unable to exclude WMA, severely dilated LA 6.6 cm, nl RVSP, mildly dilated aortic root  . COPD (chronic obstructive pulmonary disease) ([place])   . CVA [id number]   x2  . DM   . Falls   . GERD (gastroesophageal reflux disease)   . History of kidney stones   . HYPERLIPIDEMIA   . HYPERTENSION   . Kidney stone    a.  s/p left ureteral stenting 04/24/14  . Neuropathy of both feet   . Poor balance     PAST SURGICAL HISTORY:   Past Surgical History:  Procedure Laterality Date  . BLADDER SURGERY     stent placement   . CARDIAC CATHETERIZATION  1997   [facility]  . CAROTID STENT INSERTION  1997  . CIRCUMCISION  2016  . CORONARY ANGIOPLASTY  1997   s/p stent placement x 2   . CYSTOSCOPY W/ URETERAL STENT REMOVAL Left 10/09/2014   Procedure: CYSTOSCOPY WITH STENT REMOVAL;  Surgeon: Hollice Espy, MD;  Location: [facility] ORS;  Service: Urology;  Laterality: Left;  . CYSTOSCOPY WITH STENT PLACEMENT Left 10/09/2014   Procedure: CYSTOSCOPY WITH STENT PLACEMENT;  Surgeon: Hollice Espy, MD;  Location: [facility] OR[facility];  Service: Urology;  Laterality: Left;  . KIDNEY SURGERY  05/2013   s/p stent placement   . stents ureters Bilateral   . TONSILLECTOMY AND ADENOIDECTOMY  1959  . URETEROSCOPY WITH HOLMIUM LASER LITHOTRIPSY Left 10/09/2014   Procedure: URETEROSCOPY WITH HOLMIUM LASER LITHOTRIPSY;  Surgeon: Hollice Espy, MD;  Location: [facility] ORS;  Service: Urology;  Laterality: Left;    SOCIAL HISTORY:   Social History  Substance Use Topics  . Smoking status: Former Smoker    Packs/day: 2.00    Years: 40.00  Types: Cigarettes    Quit date: 05/24/1990  . Smokeless tobacco: Former Systems developer    Quit date: 05/24/1990  . Alcohol use No    FAMILY HISTORY:   Family History  Problem Relation Age of Onset  . Heart disease Mother   . Heart disease Maternal Grandmother   . Diabetes Maternal Grandmother   . Cancer Neg Hx   . Stroke Neg Hx     DRUG ALLERGIES:   Allergies  Allergen Reactions  . Contrast Media [Iodinated Diagnostic Agents] Shortness Of Breath  . Morphine And Related Other (See Comments)    Hallucinations   . Niacin And Related Dermatitis    REVIEW OF SYSTEMS:   Review of Systems  Constitutional: Positive for malaise/fatigue. Negative for chills and fever.  HENT: Negative for sore throat.   Eyes:  Negative for blurred vision and double vision.  Respiratory: Negative for cough, hemoptysis, shortness of breath, wheezing and stridor.   Cardiovascular: Positive for leg swelling. Negative for chest pain and palpitations.  Gastrointestinal: Positive for constipation. Negative for abdominal pain, blood in stool, diarrhea, melena, nausea and vomiting.  Genitourinary: Negative for dysuria and hematuria.  Musculoskeletal: Negative for back pain.  Skin: Negative for itching and rash.  Neurological: Positive for weakness. Negative for dizziness, focal weakness, loss of consciousness and headaches.  Psychiatric/Behavioral: Negative for depression. The patient is not nervous/anxious.     MEDICATIONS AT HOME:   Prior to Admission medications   Medication Sig Start Date End Date Taking? Authorizing Provider  ALPRAZolam (XANAX) 0.25 MG tablet Take 1 tablet (0.25 mg total) by mouth at bedtime as needed for anxiety. 06/14/16  Yes Jearld Fenton, NP  atorvastatin (LIPITOR) 40 MG tablet TAKE 1 TABLET DAILY 10/21/15  Yes Darylene Price A, FNP  budesonide-formoterol (SYMBICORT) 160-4.5 MCG/ACT inhaler Inhale 2 puffs into the lungs 2 (two) times daily. 10/20/15  Yes Wilhelmina Mcardle, MD  citalopram (CELEXA) 10 MG tablet TAKE 1 TABLET DAILY 09/20/16  Yes Baity, Coralie Keens, NP  dorzolamide (TRUSOPT) 2 % ophthalmic solution Place 1 drop into both eyes 3 (three) times daily.  03/31/16  Yes [provider]  ELIQUIS 5 MG TABS tablet TAKE 1 TABLET TWICE A DAY 09/20/16  Yes Gollan, Kathlene November, MD  ezetimibe (ZETIA) 10 MG tablet TAKE 1 TABLET DAILY 07/19/16  Yes Jearld Fenton, NP  finasteride (PROSCAR) 5 MG tablet TAKE 1 TABLET DAILY 03/10/16  Yes Jearld Fenton, NP  gabapentin (NEURONTIN) 100 MG capsule TAKE 1 CAPSULE THREE TIMES A DAY 05/10/16  Yes Baity, Coralie Keens, NP  glipiZIDE (GLUCOTROL) 10 MG tablet TAKE 1 TABLET TWICE A DAY BEFORE MEALS 10/04/16  Yes Baity, Coralie Keens, NP  HUMALOG KWIKPEN 100 UNIT/ML KiwkPen  INJECT 18 UNITS WITH BREAKFAST, 20 UNITS WITH LUNCH, AND 16 UNITS WITH DINNER 05/21/16  Yes Jearld Fenton, NP  Insulin Detemir (LEVEMIR FLEXPEN) 100 UNIT/ML Pen Inject 42 Units into the skin daily at 10 pm. 08/17/16  Yes Baity, Coralie Keens, NP  JANUVIA 100 MG tablet TAKE 1 TABLET DAILY 07/28/16  Yes Jearld Fenton, NP  latanoprost (XALATAN) 0.005 % ophthalmic solution Place 1 drop into both eyes at bedtime.  04/18/16  Yes [provider]  levothyroxine (SYNTHROID, LEVOTHROID) 25 MCG tablet Take 1 tablet (25 mcg total) by mouth daily before breakfast. Patient taking differently: Take 50 mcg by mouth daily before breakfast.  08/19/16  Yes Baity, Coralie Keens, NP  metoprolol succinate (TOPROL-XL) 50 MG 24 hr tablet TAKE  1 TABLET TWICE A DAY 01/19/16  Yes Tower, Wynelle Fanny, MD  Omega-3 Fatty Acids (FISH OIL) 1000 MG CAPS Take 1 capsule by mouth 3 (three) times daily.    Yes [provider]  Potassium Chloride ER 20 MEQ TBCR Take 60 mEq by mouth daily. 08/19/16  Yes Jearld Fenton, NP  tamsulosin (FLOMAX) 0.4 MG CAPS capsule TAKE 1 CAPSULE DAILY 05/10/16  Yes Baity, Coralie Keens, NP  tiotropium (SPIRIVA HANDIHALER) 18 MCG inhalation capsule Place 1 capsule (18 mcg total) into inhaler and inhale daily. 02/11/16  Yes Hackney, Tina A, FNP  torsemide (DEMADEX) 20 MG tablet Take 80 mg by mouth daily.    Yes [provider]  acetaminophen (TYLENOL) 650 MG CR tablet Take 650 mg by mouth every 8 (eight) hours as needed.      [provider]  albuterol (2.5 MG/3ML) 0.083% NEBU 3 mL, albuterol (5 MG/ML) 0.5% NEBU 0.5 mL Inhale 1 mg into the lungs.    [provider]  BD PEN NEEDLE NANO U/F 32G X 4 MM MISC USE THREE TIMES A DAY FOR INSULIN ADMINISTRATION 06/23/15   Jearld Fenton, NP  Diclofenac Sodium 1.5 % SOLN Place 2 mLs onto the skin 4 (four) times daily. 07/15/16   Copland, Frederico Hamman, MD  Insulin Syringe-Needle U-100 30G X 1/2" 1 ML MISC 1 each by Does not apply route 3 (three) times daily.  07/19/14   Jearld Fenton, NP  metolazone (ZAROXOLYN) 5 MG tablet TAKE 1 TABLET DAILY AS NEEDED 09/13/16   Minna Merritts, MD  nitroGLYCERIN (NITROSTAT) 0.4 MG SL tablet Place 0.4 mg under the tongue every 5 (five) minutes as needed.      [provider]  nystatin-triamcinolone (MYCOLOG II) cream Apply 1 application topically 2 (two) times daily.  03/12/14   [provider]  predniSONE (DELTASONE) 10 MG tablet Take 3 tabs on days 1-3, take 2 tabs on days 4-6, take 1 tab on days 7-9 10/07/16   Jearld Fenton, NP  PROAIR HFA 108 [phone] Base) MCG/ACT inhaler USE 1 TO 2 INHALATIONS EVERY 4 HOURS AS NEEDED 01/02/16   Darylene Price A, FNP      VITAL SIGNS:  Blood pressure 119/66, pulse 61, temperature 97.7 F (36.5 C), temperature source Oral, resp. rate (!) 25, height 5\' 6"  (1.676 m), weight 182 lb (82.6 kg), SpO2 92 %.  PHYSICAL EXAMINATION:  Physical Exam  GENERAL:  83 y.o.-year-old patient lying in the bed with no acute distress.  EYES: Pupils equal, round, reactive to light and accommodation. No scleral icterus. Extraocular muscles intact.  HEENT: Head atraumatic, normocephalic. Oropharynx and nasopharynx clear.  NECK:  Supple, no jugular venous distention. No thyroid enlargement, no tenderness.  LUNGS: Diminished breath sounds bilaterally, no wheezing, rales,rhonchi or crepitation. No use of accessory muscles of respiration.  CARDIOVASCULAR: S1, S2 normal. No murmurs, rubs, or gallops.  ABDOMEN: Soft, nontender, nondistended. Bowel sounds present. No organomegaly or mass.  EXTREMITIES: No pedal edema, cyanosis, or clubbing.  NEUROLOGIC: Cranial nerves II through XII are intact. Muscle strength 5/5 in all extremities. Sensation intact. Gait not checked.  PSYCHIATRIC: The patient is alert and oriented x 3.  SKIN: No obvious rash, lesion, or ulcer.   LABORATORY PANEL:   CBC  Recent Labs Lab 10/10/16 0745  WBC 18.1*  HGB 15.5  HCT 45.0  PLT 272    ------------------------------------------------------------------------------------------------------------------  Chemistries   Recent Labs Lab 10/10/16 0745  NA 137  K 2.5*  CL  93*  CO2 32  GLUCOSE 147*  BUN 48*  CREATININE 1.87*  CALCIUM 9.1  MG 2.2   ------------------------------------------------------------------------------------------------------------------  Cardiac Enzymes  Recent Labs Lab 10/10/16 1140  TROPONINI 0.03*   ------------------------------------------------------------------------------------------------------------------  RADIOLOGY:  Dg Chest 2 View  Result Date: 10/10/2016 CLINICAL DATA:  Fall this morning. Chest pain and shortness of breath. Initial encounter. EXAM: CHEST  2 VIEW COMPARISON:  10/09/2014 FINDINGS: The heart size and mediastinal contours are within normal limits. Both lungs are clear. No evidence of pneumothorax or hemothorax. The visualized skeletal structures are unremarkable. IMPRESSION: No active cardiopulmonary disease. Electronically Signed   By: Earle Gell M.D.   On: 10/10/2016 08:48   Ct Head Wo Contrast  Result Date: 10/10/2016 CLINICAL DATA:  Fall this morning. Head injury. On anticoagulation. EXAM: CT HEAD WITHOUT CONTRAST TECHNIQUE: Contiguous axial images were obtained from the base of the skull through the vertex without intravenous contrast. COMPARISON:  05/02/2014 FINDINGS: Brain: No evidence of acute infarction, hemorrhage, hydrocephalus, extra-axial collection, or mass lesion/mass effect. Mild to moderate diffuse cerebral and cerebellar atrophy and chronic small vessel disease shows no significant change. Bold lacunar infarct again seen involving the left lentiform nucleus. Vascular:  No hyperdense vessel or other acute findings. Skull: No evidence of fracture or other significant bone abnormality. Sinuses/Orbits:  No acute findings. Other: None. IMPRESSION: No acute intracranial abnormality. Stable cerebral and  cerebellar atrophy, chronic small vessel disease, and old left basal ganglia lacunar infarct. Electronically Signed   By: Earle Gell M.D.   On: 10/10/2016 10:13   Ct Chest Wo Contrast  Result Date: 10/10/2016 CLINICAL DATA:  [place] and injured chest this morning. EXAM: CT CHEST WITHOUT CONTRAST TECHNIQUE: Multidetector CT imaging of the chest was performed following the standard protocol without IV contrast. COMPARISON:  Chest CT 04/18/2015 FINDINGS: Cardiovascular: The heart is normal in size for age and stable. No pericardial effusion. Stable dense atherosclerotic calcifications involving the thoracic aorta but no focal aneurysm. Dense three-vessel coronary artery calcifications are again noted. Mediastinum/Nodes: Stable small scattered mediastinal and hilar lymph nodes. No mass or overt lymphadenopathy. The esophagus is grossly normal. There appears to be some debris in the trachea above the carina. Lungs/Pleura: Stable changes of interstitial lung disease along with underlying emphysema. There is a rounded slightly spiculated 2.1 cm lesion in the left lower lobe. This measured 8 mm on the prior study and is worrisome for neoplasm. Other small scattered pulmonary nodules appears stable and may be related to the patient's interstitial lung disease. No infiltrates, effusions or edema. Upper Abdomen: Her cholelithiasis with a stone filled contracted gallbladder. Stable scattered pancreatic calcifications likely postinflammatory. The adrenal glands appear normal. Chest wall/ Musculoskeletal: No chest wall mass, supraclavicular or axillary lymphadenopathy. Thyroid gland is grossly normal. The sagittal reformatted images demonstrate a small fracture involving the anterior cortex of the sternum. The posterior cortex is intact. The thoracic vertebral bodies are normally aligned. No obvious acute fracture. Mild stable wedging of the mid thoracic vertebral bodies with exaggerated kyphosis. IMPRESSION: 1. Small  nondisplaced fracture involving the anterior cortex of the mid sternum. 2. No definite acute rib or thoracic vertebral body fractures. 3. **An incidental finding of potential clinical significance has been found. 2.1 cm left lower lobe lung lesion is worrisome for neoplasm. Recommend PET-CT for further evaluation.** 4. Interstitial lung disease and numerous small pulmonary nodules most of which appear relatively stable. 5. Stable scattered mediastinal and hilar lymph nodes without overt adenopathy or mass. 6. Stable advanced atherosclerotic  calcifications involving the aorta pain coronary arteries. 7. Cholelithiasis Aortic Atherosclerosis (ICD10-I70.0) and Emphysema (ICD10-J43.9). Electronically Signed   By: Marijo Sanes M.D.   On: 10/10/2016 10:21      IMPRESSION AND PLAN:   Severe hypokalemia. The patient will be admitted to medical floor. Telemetry monitor, give both IV and by mouth potassium, follow-up BMP. Magnesium level is normal.  Sternal fracture. Pain control.  Acute renal failure on CKD stage 3. Hold diuretics and give gentle rehydration, follow-up BMP.   Left lower lobe mass.  According to the patient and his sons, the lumbars is not new, he is following up Dr. Shawna Orleans. Follow-up with Dr. Shawna Orleans as outpatient.  Diabetes. Continue Levemir and start sliding scale. COPD. Stable. NEB when necessary. History of diastolic CHF. Stable. Hold diuretics for acute renal failure for now. Chronic A. fib. Heart rate is controlled, continue Eliquis. Generalized weakness. PT evaluation.  All the records are reviewed and case discussed with ED provider. Management plans discussed with the patient, his 2 sons and they are in agreement.  CODE STATUS: Full code  TOTAL TIME TAKING CARE OF THIS PATIENT: 56 minutes.    Demetrios Loll M.D on 10/10/2016 at 1:56 PM  Between 7am to 6pm - Pager - [phone]  After 6pm go to [email or web address] - Proofreader  [facility]   Office  [phone]  CC: Primary care physician; Jearld Fenton, NP   Note: This dictation was prepared with Dragon dictation along with smaller phrase technology. Any transcriptional errors that result from this process are unintentional.

## 2016-10-10 NOTE — ED Notes (Signed)
VORB received for fluids to be given with the K+ due to patient complaints of burning.

## 2016-10-10 NOTE — ED Notes (Addendum)
Potassium started at a rate of 66.75mL/hr to run 50mEq over 1.5 hrs per Dr. Jacqualine Code to reduce burning.

## 2016-10-10 NOTE — ED Provider Notes (Signed)
[facility] Emergency Department Provider Note  ____________________________________________   First MD Initiated Contact with Patient 10/10/16 [phone]     (approximate)  I have reviewed the triage vital signs and the nursing notes.   HISTORY  Chief Complaint Fall and Chest Pain   HPI [name]. is a 83 y.o. male who fell this morning after getting up from bed  Patient reports he got up this morning he felt as a skid legs kind of gave way. He fell 4 striking his head on the dresser and also his chest. Reports he has pain across the front of his face with a small abrasion also pain over the center of his chest when he takes a deep breath.  Denies heavy chest pressure. Reports pain is worse when he moves or tries to deep breath. He has a small abrasion of the right arm. He takes a blood thinner.  No nausea vomiting. No weakness numbness or tingling   Past Medical History:  Diagnosis Date  . Arthritis   . CAD    a. MI 01/29/1996 tx'd w/ TPA @ [place]; b. Myoview 06/2005: EF 50%, scar @ apex, mild peri-infarct ischemia  . Cancer ([place])    skin  . Chronic atrial fibrillation ([place])    a. since 2006; b. on warfarin  . Chronic diastolic CHF (congestive heart failure) ([place])    a. echo 04/2006: EF lower limits of nl, mod LVH, mild aortic root dilatation, & mild MR, biatrial enlargement; b. echo 04/2013: EF 60%, mod dilated LA, mild MR & TR, mod pulm HTN w/ RV systolic pressure 53, c. echo 04/21/14: EF 55-60%, unable to exclude WMA, severely dilated LA 6.6 cm, nl RVSP, mildly dilated aortic root  . COPD (chronic obstructive pulmonary disease) ([place])   . CVA [id number]   x2  . DM   . Falls   . GERD (gastroesophageal reflux disease)   . History of kidney stones   . HYPERLIPIDEMIA   . HYPERTENSION   . Kidney stone    a. s/p left ureteral stenting 04/24/14  . Neuropathy of both feet   . Poor balance     Patient Active Problem List   Diagnosis Date Noted    . Diabetes type 2, controlled ([place]) 06/13/2015  . BPH (benign prostatic hyperplasia) 05/14/2014  . Anxiety 05/14/2014  . Chronic obstructive pulmonary disease ([place]) 05/14/2014  . Chronic diastolic CHF (congestive heart failure) ([place])   . Falls frequently 09/24/2013  . Insomnia 08/07/2013  . Hyperlipidemia 02/26/2009  . MITRAL REGURGITATION 02/26/2009  . Essential hypertension 02/26/2009  . MI 02/26/2009  . CAD (coronary artery disease) 02/26/2009  . ATRIAL FIBRILLATION 02/26/2009  . CVA (cerebral vascular accident) ([place]) 02/26/2009    Past Surgical History:  Procedure Laterality Date  . BLADDER SURGERY     stent placement   . CARDIAC CATHETERIZATION  1997   [facility]  . CAROTID STENT INSERTION  1997  . CIRCUMCISION  2016  . CORONARY ANGIOPLASTY  1997   s/p stent placement x 2   . CYSTOSCOPY W/ URETERAL STENT REMOVAL Left 10/09/2014   Procedure: CYSTOSCOPY WITH STENT REMOVAL;  Surgeon: Hollice Espy, MD;  Location: [facility] ORS;  Service: Urology;  Laterality: Left;  . CYSTOSCOPY WITH STENT PLACEMENT Left 10/09/2014   Procedure: CYSTOSCOPY WITH STENT PLACEMENT;  Surgeon: Hollice Espy, MD;  Location: [facility] ORS;  Service: Urology;  Laterality: Left;  . KIDNEY SURGERY  05/2013   s/p stent placement   . stents  ureters Bilateral   . TONSILLECTOMY AND ADENOIDECTOMY  1959  . URETEROSCOPY WITH HOLMIUM LASER LITHOTRIPSY Left 10/09/2014   Procedure: URETEROSCOPY WITH HOLMIUM LASER LITHOTRIPSY;  Surgeon: Hollice Espy, MD;  Location: [facility] ORS;  Service: Urology;  Laterality: Left;    Prior to Admission medications   Medication Sig Start Date End Date Taking? Authorizing Provider  ALPRAZolam (XANAX) 0.25 MG tablet Take 1 tablet (0.25 mg total) by mouth at bedtime as needed for anxiety. 06/14/16  Yes Jearld Fenton, NP  atorvastatin (LIPITOR) 40 MG tablet TAKE 1 TABLET DAILY 10/21/15  Yes Darylene Price A, FNP  budesonide-formoterol (SYMBICORT) 160-4.5 MCG/ACT inhaler Inhale 2 puffs into the lungs 2  (two) times daily. 10/20/15  Yes Wilhelmina Mcardle, MD  citalopram (CELEXA) 10 MG tablet TAKE 1 TABLET DAILY 09/20/16  Yes Baity, Coralie Keens, NP  dorzolamide (TRUSOPT) 2 % ophthalmic solution Place 1 drop into both eyes 3 (three) times daily.  03/31/16  Yes [provider]  ELIQUIS 5 MG TABS tablet TAKE 1 TABLET TWICE A DAY 09/20/16  Yes Gollan, Kathlene November, MD  ezetimibe (ZETIA) 10 MG tablet TAKE 1 TABLET DAILY 07/19/16  Yes Jearld Fenton, NP  finasteride (PROSCAR) 5 MG tablet TAKE 1 TABLET DAILY 03/10/16  Yes Jearld Fenton, NP  gabapentin (NEURONTIN) 100 MG capsule TAKE 1 CAPSULE THREE TIMES A DAY 05/10/16  Yes Baity, Coralie Keens, NP  glipiZIDE (GLUCOTROL) 10 MG tablet TAKE 1 TABLET TWICE A DAY BEFORE MEALS 10/04/16  Yes Baity, Coralie Keens, NP  HUMALOG KWIKPEN 100 UNIT/ML KiwkPen INJECT 18 UNITS WITH BREAKFAST, 20 UNITS WITH LUNCH, AND 16 UNITS WITH DINNER 05/21/16  Yes Jearld Fenton, NP  Insulin Detemir (LEVEMIR FLEXPEN) 100 UNIT/ML Pen Inject 42 Units into the skin daily at 10 pm. 08/17/16  Yes Baity, Coralie Keens, NP  JANUVIA 100 MG tablet TAKE 1 TABLET DAILY 07/28/16  Yes Jearld Fenton, NP  latanoprost (XALATAN) 0.005 % ophthalmic solution Place 1 drop into both eyes at bedtime.  04/18/16  Yes [provider]  levothyroxine (SYNTHROID, LEVOTHROID) 25 MCG tablet Take 1 tablet (25 mcg total) by mouth daily before breakfast. Patient taking differently: Take 50 mcg by mouth daily before breakfast.  08/19/16  Yes Baity, Coralie Keens, NP  metoprolol succinate (TOPROL-XL) 50 MG 24 hr tablet TAKE 1 TABLET TWICE A DAY 01/19/16  Yes Tower, Wynelle Fanny, MD  Omega-3 Fatty Acids (FISH OIL) 1000 MG CAPS Take 1 capsule by mouth 3 (three) times daily.    Yes [provider]  Potassium Chloride ER 20 MEQ TBCR Take 60 mEq by mouth daily. 08/19/16  Yes Jearld Fenton, NP  tamsulosin (FLOMAX) 0.4 MG CAPS capsule TAKE 1 CAPSULE DAILY 05/10/16  Yes Baity, Coralie Keens, NP  tiotropium (SPIRIVA HANDIHALER) 18 MCG inhalation  capsule Place 1 capsule (18 mcg total) into inhaler and inhale daily. 02/11/16  Yes Hackney, Tina A, FNP  torsemide (DEMADEX) 20 MG tablet Take 80 mg by mouth daily.    Yes [provider]  acetaminophen (TYLENOL) 650 MG CR tablet Take 650 mg by mouth every 8 (eight) hours as needed.      [provider]  albuterol (2.5 MG/3ML) 0.083% NEBU 3 mL, albuterol (5 MG/ML) 0.5% NEBU 0.5 mL Inhale 1 mg into the lungs.    [provider]  BD PEN NEEDLE NANO U/F 32G X 4 MM MISC USE THREE TIMES A DAY FOR INSULIN ADMINISTRATION 06/23/15   Jearld Fenton, NP  Diclofenac Sodium 1.5 % SOLN Place 2 mLs onto the skin 4 (four) times daily. 07/15/16   Copland, Frederico Hamman, MD  Insulin Syringe-Needle U-100 30G X 1/2" 1 ML MISC 1 each by Does not apply route 3 (three) times daily. 07/19/14   Jearld Fenton, NP  metolazone (ZAROXOLYN) 5 MG tablet TAKE 1 TABLET DAILY AS NEEDED 09/13/16   Minna Merritts, MD  nitroGLYCERIN (NITROSTAT) 0.4 MG SL tablet Place 0.4 mg under the tongue every 5 (five) minutes as needed.      [provider]  nystatin-triamcinolone (MYCOLOG II) cream Apply 1 application topically 2 (two) times daily.  03/12/14   [provider]  predniSONE (DELTASONE) 10 MG tablet Take 3 tabs on days 1-3, take 2 tabs on days 4-6, take 1 tab on days 7-9 10/07/16   Jearld Fenton, NP  PROAIR HFA 108 [phone] Base) MCG/ACT inhaler USE 1 TO 2 INHALATIONS EVERY 4 HOURS AS NEEDED 01/02/16   Alisa Graff, FNP    Allergies Contrast media [iodinated diagnostic agents]; Morphine and related; and Niacin and related  Family History  Problem Relation Age of Onset  . Heart disease Mother   . Heart disease Maternal Grandmother   . Diabetes Maternal Grandmother   . Cancer Neg Hx   . Stroke Neg Hx     Social History Social History  Substance Use Topics  . Smoking status: Former Smoker    Packs/day: 2.00    Years: 40.00    Types: Cigarettes    Quit date: 05/24/1990  . Smokeless  tobacco: Former Systems developer    Quit date: 05/24/1990  . Alcohol use No    Review of Systems Constitutional: No fever/chills Eyes: No visual changes. ENT: No sore throat. Cardiovascular: See history of present illness Respiratory: Denies shortness of breath. Gastrointestinal: No abdominal pain.  No nausea, no vomiting.  No diarrhea.  No constipation. Genitourinary: Negative for dysuria. Musculoskeletal: Negative for back pain. Skin: Negative for rash. Neurological: Negative for headaches, focal weakness or numbness. Reports his arms and legs feel somewhat weak overall the last day. Denies any focal weakness    ____________________________________________   PHYSICAL EXAM:  VITAL SIGNS: ED Triage Vitals  Enc Vitals Group     BP 10/10/16 0736 (!) 109/45     Pulse Rate 10/10/16 0736 61     Resp 10/10/16 0736 20     Temp 10/10/16 0736 97.7 F (36.5 C)     Temp Source 10/10/16 0736 Oral     SpO2 10/10/16 0736 96 %     Weight 10/10/16 0736 182 lb (82.6 kg)     Height 10/10/16 0736 5\' 6"  (1.676 m)     Head Circumference --      Peak Flow --      Pain Score 10/10/16 0749 5     Pain Loc --      Pain Edu? --      Excl. in [place]? --     Constitutional: Alert and oriented. Well appearing and in no acute distress. Eyes: Conjunctivae are normal. Head: Atraumatic. Nose: No congestion/rhinnorhea. Mouth/Throat: Mucous membranes are moist.Small abrasion across the forehead Neck: No stridor.   Cardiovascular: Normal rate, regular rhythm. Grossly normal heart sounds.  Good peripheral circulation. Respiratory: Normal respiratory effort.  No retractions. Lungs CTAB. Mild tenderness to palpation across the anterior sternum. Gastrointestinal: Soft and nontender. No distention. Musculoskeletal:   RIGHT Right upper extremity demonstrates normal strength, good use of all muscles. No edema bruising or  contusions of the right shoulder/upper arm, right elbow, right forearm / hand except for a small skin  tear over the right elbow. Full range of motion of the right right upper extremity without pain.Strong radial pulse. Intact median/ulnar/radial neuro-muscular exam.  LEFT Left upper extremity demonstrates normal strength, good use of all muscles. No edema bruising or contusions of the left shoulder/upper arm, left elbow, left forearm / hand. Full range of motion of the left  upper extremity without pain. No evidence of trauma. Strong radial pulse. Intact median/ulnar/radial neuro-muscular exam.   No lower extremity tenderness nor edema. Neurologic:  Normal speech and language. No gross focal neurologic deficits are appreciated.  Skin:  Skin is warm, dry and intact. No rash noted. Psychiatric: Mood and affect are normal. Speech and behavior are normal.  ____________________________________________   LABS (all labs ordered are listed, but only abnormal results are displayed)  Labs Reviewed  BASIC METABOLIC PANEL - Abnormal; Notable for the following:       Result Value   Potassium 2.5 (*)    Chloride 93 (*)    Glucose, Bld 147 (*)    BUN 48 (*)    Creatinine, Ser 1.87 (*)    GFR calc non Af Amer 32 (*)    GFR calc Af Amer 37 (*)    All other components within normal limits  CBC - Abnormal; Notable for the following:    WBC 18.1 (*)    RDW 16.7 (*)    All other components within normal limits  TROPONIN I - Abnormal; Notable for the following:    Troponin I 0.03 (*)    All other components within normal limits  MAGNESIUM  TROPONIN I   ____________________________________________  EKG  Reviewed and interpreted by me at 7:40 AM Ventricular rate 60 QRS 90 QTc 520 520 Atrial fibrillation, LVH, repolarization abnormality likely. No obvious ischemic change, favor repolarization abnormality seen in 1 and aVL.  Prolonged QT is new  ____________________________________________  RADIOLOGY  Dg Chest 2 View  Result Date: 10/10/2016 CLINICAL DATA:  Fall this morning. Chest pain  and shortness of breath. Initial encounter. EXAM: CHEST  2 VIEW COMPARISON:  10/09/2014 FINDINGS: The heart size and mediastinal contours are within normal limits. Both lungs are clear. No evidence of pneumothorax or hemothorax. The visualized skeletal structures are unremarkable. IMPRESSION: No active cardiopulmonary disease. Electronically Signed   By: Earle Gell M.D.   On: 10/10/2016 08:48   Ct Head Wo Contrast  Result Date: 10/10/2016 CLINICAL DATA:  Fall this morning. Head injury. On anticoagulation. EXAM: CT HEAD WITHOUT CONTRAST TECHNIQUE: Contiguous axial images were obtained from the base of the skull through the vertex without intravenous contrast. COMPARISON:  05/02/2014 FINDINGS: Brain: No evidence of acute infarction, hemorrhage, hydrocephalus, extra-axial collection, or mass lesion/mass effect. Mild to moderate diffuse cerebral and cerebellar atrophy and chronic small vessel disease shows no significant change. Bold lacunar infarct again seen involving the left lentiform nucleus. Vascular:  No hyperdense vessel or other acute findings. Skull: No evidence of fracture or other significant bone abnormality. Sinuses/Orbits:  No acute findings. Other: None. IMPRESSION: No acute intracranial abnormality. Stable cerebral and cerebellar atrophy, chronic small vessel disease, and old left basal ganglia lacunar infarct. Electronically Signed   By: Earle Gell M.D.   On: 10/10/2016 10:13   Ct Chest Wo Contrast  Result Date: 10/10/2016 CLINICAL DATA:  [place] and injured chest this morning. EXAM: CT CHEST WITHOUT CONTRAST TECHNIQUE: Multidetector CT imaging of the chest was  performed following the standard protocol without IV contrast. COMPARISON:  Chest CT 04/18/2015 FINDINGS: Cardiovascular: The heart is normal in size for age and stable. No pericardial effusion. Stable dense atherosclerotic calcifications involving the thoracic aorta but no focal aneurysm. Dense three-vessel coronary artery calcifications  are again noted. Mediastinum/Nodes: Stable small scattered mediastinal and hilar lymph nodes. No mass or overt lymphadenopathy. The esophagus is grossly normal. There appears to be some debris in the trachea above the carina. Lungs/Pleura: Stable changes of interstitial lung disease along with underlying emphysema. There is a rounded slightly spiculated 2.1 cm lesion in the left lower lobe. This measured 8 mm on the prior study and is worrisome for neoplasm. Other small scattered pulmonary nodules appears stable and may be related to the patient's interstitial lung disease. No infiltrates, effusions or edema. Upper Abdomen: Her cholelithiasis with a stone filled contracted gallbladder. Stable scattered pancreatic calcifications likely postinflammatory. The adrenal glands appear normal. Chest wall/ Musculoskeletal: No chest wall mass, supraclavicular or axillary lymphadenopathy. Thyroid gland is grossly normal. The sagittal reformatted images demonstrate a small fracture involving the anterior cortex of the sternum. The posterior cortex is intact. The thoracic vertebral bodies are normally aligned. No obvious acute fracture. Mild stable wedging of the mid thoracic vertebral bodies with exaggerated kyphosis. IMPRESSION: 1. Small nondisplaced fracture involving the anterior cortex of the mid sternum. 2. No definite acute rib or thoracic vertebral body fractures. 3. **An incidental finding of potential clinical significance has been found. 2.1 cm left lower lobe lung lesion is worrisome for neoplasm. Recommend PET-CT for further evaluation.** 4. Interstitial lung disease and numerous small pulmonary nodules most of which appear relatively stable. 5. Stable scattered mediastinal and hilar lymph nodes without overt adenopathy or mass. 6. Stable advanced atherosclerotic calcifications involving the aorta pain coronary arteries. 7. Cholelithiasis Aortic Atherosclerosis (ICD10-I70.0) and Emphysema (ICD10-J43.9).  Electronically Signed   By: Marijo Sanes M.D.   On: 10/10/2016 10:21    ____________________________________________   PROCEDURES  Procedure(s) performed: None  Procedures  Critical Care performed: No  ____________________________________________   INITIAL IMPRESSION / ASSESSMENT AND PLAN / ED COURSE  Pertinent labs & imaging results that were available during my care of the patient were reviewed by me and considered in my medical decision making (see chart for details).  Patient presents for evaluation of a fall.  EKG demonstrates prolongation of CT. No obvious acute ischemic change, though T wave abnormalities seem more prominent now. His first troponin is minimally elevated. Of note though his potassium is 2.5 and he reports fatigue and generalized weakness. I will replete this given his associated prolonged QT interval and fall today we will admit him to the hospital for repletion and ongoing monitoring of cardiac symptomatology. CT is negative for obvious large acute trauma, though a nondisplaced sternal fracture is noted. In addition the patient has a lung mass which is concerning, but will be need to be worked up further.  ----------------------------------------- 12:15 PM on 10/10/2016 -----------------------------------------  Patient agreeable with plan for admission. Pain is much better, now it 2-3 out of 10 after pain medication.      ____________________________________________   FINAL CLINICAL IMPRESSION(S) / ED DIAGNOSES  Final diagnoses:  Hypokalemia  Closed fracture of sternum, unspecified portion of sternum, initial encounter  Elevated troponin  Lung mass  Prolonged Q-T interval on ECG  Skin tear of right elbow without complication, initial encounter      NEW MEDICATIONS STARTED DURING THIS VISIT:  New Prescriptions   No medications  on file     Note:  This document was prepared using Dragon voice recognition software and may include  unintentional dictation errors.     Delman Kitten, MD 10/10/16 1215

## 2016-10-11 ENCOUNTER — Encounter: Payer: Self-pay | Admitting: Internal Medicine

## 2016-10-11 LAB — CBC
HCT: 43.6 % (ref 40.0–52.0)
Hemoglobin: 15.1 g/dL (ref 13.0–18.0)
MCH: 28.7 pg (ref 26.0–34.0)
MCHC: 34.6 g/dL (ref 32.0–36.0)
MCV: 82.9 fL (ref 80.0–100.0)
Platelets: 243 10*3/uL (ref 150–440)
RBC: 5.26 MIL/uL (ref 4.40–5.90)
RDW: 17 % — ABNORMAL HIGH (ref 11.5–14.5)
WBC: 16.2 10*3/uL — ABNORMAL HIGH (ref 3.8–10.6)

## 2016-10-11 LAB — BASIC METABOLIC PANEL
Anion gap: 8 (ref 5–15)
BUN: 42 mg/dL — ABNORMAL HIGH (ref 6–20)
CHLORIDE: 101 mmol/L (ref 101–111)
CO2: 31 mmol/L (ref 22–32)
CREATININE: 1.37 mg/dL — AB (ref 0.61–1.24)
Calcium: 8.6 mg/dL — ABNORMAL LOW (ref 8.9–10.3)
GFR calc non Af Amer: 46 mL/min — ABNORMAL LOW (ref >60)
GFR, EST AFRICAN AMERICAN: 54 mL/min — AB (ref >60)
Glucose, Bld: 136 mg/dL — ABNORMAL HIGH (ref 65–99)
Potassium: 3.1 mmol/L — ABNORMAL LOW (ref 3.5–5.1)
Sodium: 140 mmol/L (ref 135–145)

## 2016-10-11 LAB — GLUCOSE, CAPILLARY
GLUCOSE-CAPILLARY: 183 mg/dL — AB (ref 65–99)
Glucose-Capillary: 117 mg/dL — ABNORMAL HIGH (ref 65–99)

## 2016-10-11 MED ORDER — POTASSIUM CHLORIDE CRYS ER 20 MEQ PO TBCR
40.0000 meq | EXTENDED_RELEASE_TABLET | ORAL | Status: AC
  Administered 2016-10-11 (×2): 40 meq via ORAL
  Filled 2016-10-11 (×2): qty 2

## 2016-10-11 MED ORDER — HYDROCODONE-ACETAMINOPHEN 5-325 MG PO TABS: 1.0000 | ORAL_TABLET | Freq: Four times a day (QID) | ORAL | 0 refills | Status: DC | PRN

## 2016-10-11 MED ORDER — POTASSIUM CHLORIDE ER 20 MEQ PO TBCR: 40.0000 meq | EXTENDED_RELEASE_TABLET | Freq: Two times a day (BID) | ORAL | 0 refills | Status: DC

## 2016-10-11 NOTE — Care Management (Addendum)
Admitted to this facility with the diagnosis of hypokalemia, Lives with wife, [name] [phone]). Last seen Baity NP at [facility] 4-5 weeks ago. Prescriptions are filled per [facility] or [facility]. [facility] about a year ago. [place] x 4 days for rehabilitation, then had to return to hospital, [facility] oxygen per [facility] since 2016. 2 liters per nasal cannula as needed. CPAP at night. Grab bars in the home. Takes care of all basic activities of daily living himself, doesn't drive. Family helps with errands. Fell prior to admission. Good appetite. Physical therapy evaluation completed. Recommending home with home health and physical therapy. Wants [facility]. Floydene Flock, [facility] representative updated. Discharge to home today per Dr.n Sudini Shelbie Ammons RN MSN CCM Care Management [phone]

## 2016-10-11 NOTE — Progress Notes (Signed)
Inpatient Diabetes Program Recommendations  AACE/ADA: New Consensus Statement on Inpatient Glycemic Control (2015)  Target Ranges:  Prepandial:   less than 140 mg/dL      Peak postprandial:   less than 180 mg/dL (1-2 hours)      Critically ill patients:  140 - 180 mg/dL   Lab Results  Component Value Date   GLUCAP 117 (H) 10/11/2016   HGBA1C 7.5 (H) 08/17/2016    Review of Glycemic Control  Results for HERMINIO, KNISKERN (MRN [id number]) as of 10/11/2016 11:32  Ref. Range 10/11/2014 07:44 10/11/2014 12:26 10/10/2016 16:32 10/10/2016 20:39 10/11/2016 07:25  Glucose-Capillary Latest Ref Range: 65 - 99 mg/dL 210 (H) 307 (H) 269 (H) 234 (H) 117 (H)    Diabetes history: Type 2 Outpatient Diabetes medications: Januvia 100mg /day, Glucotrol 10mg  pre-meals, Humalog 18 units with breakfast, 20 units with lunch, 16 units with supper, Levemir 42 units qhs  Current orders for Inpatient glycemic control: Levemir 42 units qhs, Novolog 0-9 units tid, Novolog 0-5 units qhs  Inpatient Diabetes Program Recommendations:  Please consider adding Novolog 8 units tid with meals.  Continue Novolog correction as ordered.   Gentry Fitz, RN, BA, MHA, CDE Diabetes Coordinator Inpatient Diabetes Program  [phone] (Team Pager) [phone] ([place]) 10/11/2016 11:37 AM

## 2016-10-11 NOTE — Evaluation (Signed)
Physical Therapy Evaluation Patient Details Name: [name]. MRN: [id number] DOB: 30-Jun-1931 Today's Date: 10/11/2016   History of Present Illness  83 y/o male admitted on 10/10/16 s/p a fall. On 10/10/16, pt was ambulating to the bathroom when he fell and hit his head and chest on his dresser. CT scan negative for any acute head trauma, however x-rays were positive for a non-displaced sternal fracture and L lung mass (possible neoplasm). Pt also found to be very hypokalemic (2.5) at admission. PMH includes CAD, COPD, arthritis (L knee), chronic a-fib, diastolic CHF, DM, neuropathy, CVA (2, impacting L side), HTN, and HLD.    Clinical Impression  Pt is a pleasant 83 year old admitted for a sternal fracture and hypokalemia s/p a fall. Pt performs transfers and ambulation with min guard and RW. Did not assess bed mobility this session - pt at EOB upon arrival to room. Ambulated 88ft with RW. Distance limited secondary to pt's chest pain (secondary to sternal fracture). With +1 hand held assistance, pt was very unsteady on his feet ambulating (106ft attempted). With RW, pt very steady and safe to ambulate with min guard. Pt educated to always ambulate with an AD at home and in hospital (preferrably RW) to minimize fall risk. Unable to formally assess strength with MMT due to chest pain with resistance (due to sternal fracture). However, pt functionally appears strong. This session mainly limited due to pain. Pt demonstrates deficits in pain, functional mobility and activity tolerance. Would benefit from skilled PT to address above deficits and promote optimal return to PLOF. Pt is motivated to participate in therapy. PT recommends dc home with home health PT. Pt would also benefit from an OT consult to assess ADL function. Furthermore, PT would recommend an aid service for pt's home after dc from hospital to assist with ADL's (bathing, dressing, etc.) due to pt's decreased functional use of his BUE's s/p  sternal fracture.     Follow Up Recommendations Home health PT    Equipment Recommendations  None recommended by PT    Recommendations for Other Services OT consult (RN notified)     Precautions / Restrictions Precautions Precautions: Fall Precaution Comments: Pt states he has fallen 8-10x within his home. His falls occur due to staggering and LOB. Pt states he could not walk a straight line.  Restrictions Weight Bearing Restrictions: No Other Position/Activity Restrictions: Avoid push/pull with BUE's or shoulder movements passed shoulder height.       Mobility  Bed Mobility               General bed mobility comments: Did not assess - pt at EOB upon arrival to room  Transfers Overall transfer level: Needs assistance Equipment used: Rolling walker (2 wheeled) Transfers: Sit to/from Stand Sit to Stand: Min guard         General transfer comment: Sit to/from stand with min guard. Verbal cueing for hand placement and to utilize LE for push-off more than using UE's due to chest pain. On 2nd sit to stand, pt able to execute improved technique, which caused less pain. No dizziness noted once standing and pt steady on his feet standing statically.  Ambulation/Gait Ambulation/Gait assistance: Min guard Ambulation Distance (Feet): 12 Feet Assistive device: Rolling walker (2 wheeled) Gait Pattern/deviations: Step-to pattern;Trunk flexed;Wide base of support     General Gait Details: Pt stated he ambulates with hand held assist from nurse aid from EOB to bathroom (3ft). PT attempted this for 49ft, but decided to  ambulate with RW due to pt's unsteadiness and staggering gait. Pt able to use RW with BUE's for balance assist without increasing his chest pain. Pt demonstrated greatly improved balance and steadiness with RW. Pt educated to ambulate with a RW or rollator at all times (both in hospital and in his home).  Stairs            Wheelchair Mobility    Modified  Rankin (Stroke Patients Only)       Balance Overall balance assessment: Needs assistance;History of Falls Sitting-balance support: Feet unsupported;No upper extremity supported   Sitting balance - Comments: Good seated balance. Able to perform seated ther-ex with no LOB and no UE support.   Standing balance support: Bilateral upper extremity supported   Standing balance comment: Fair. Required BUE assist and RW to remain steady and safe on his feet when performing dynamic activities (walking). Pt able to stand statically with BUE support on RW with no sway or LOB noted.                             Pertinent Vitals/Pain Pain Assessment: 0-10 Pain Score: 10-Worst pain ever (10/10 pain when moving) Pain Location: chest Pain Descriptors / Indicators: Discomfort;Grimacing;Guarding;Moaning;Penetrating Pain Intervention(s): Limited activity within patient's tolerance;Monitored during session;Premedicated before session;Repositioned    Home Living Family/patient expects to be discharged to:: Private residence Living Arrangements: Spouse/significant other Available Help at Discharge: Family (wife and son available PRN (only wife lives with him)) Type of Home: House Home Access: Stairs to enter Entrance Stairs-Rails: Can reach both Entrance Stairs-Number of Steps: 2 Home Layout: One level Home Equipment: Environmental consultant - 2 wheels;Cane - quad;Cane - single point;Bedside commode (rollator) Additional Comments: Pt's wife has Parkinson's disease. Pt states they tend to alternate between who takes care of who depending on their current functional status.     Prior Function Level of Independence: Independent with assistive device(s)         Comments: Pt did not ambulate with an AD within his home (despite 8-10 falls). Ambulated with rollator or SPC for longer distances outside his home. Pt states he has not driven in over 73yrs due to his CVA. Pt states his mobility is most limited due to  L knee pain secondary to arthritis, which is being treated with steroids currently.      Hand Dominance        Extremity/Trunk Assessment   Upper Extremity Assessment Upper Extremity Assessment: Generalized weakness (At least 3/5 BUE for elbow flex/ext, 4/5 for B grip)    Lower Extremity Assessment Lower Extremity Assessment: Generalized weakness (At least 4/5 BLE for knee flex/ext and PF/DF)       Communication   Communication: No difficulties  Cognition Arousal/Alertness: Awake/alert Behavior During Therapy: WFL for tasks assessed/performed Overall Cognitive Status: Within Functional Limits for tasks assessed                                        General Comments      Exercises Other Exercises Other Exercises: Seated ther-ex x10 BLE included: LAQ's and ankle pumps. Pt unable to perform seated marches secondary to chest pain (due to sternal fracture). Performed with supervision and verbal cueing to continue breathing (pt tended to hold his breath).    Assessment/Plan    PT Assessment Patient needs continued PT services  PT Problem List  Decreased strength;Decreased activity tolerance;Decreased balance;Decreased mobility;Pain       PT Treatment Interventions Gait training;Stair training;Therapeutic activities;Therapeutic exercise;Balance training;Patient/family education    PT Goals (Current goals can be found in the Care Plan section)  Acute Rehab PT Goals Patient Stated Goal: to go home PT Goal Formulation: With patient Time For Goal Achievement: 10/25/16 Potential to Achieve Goals: Good    Frequency 7X/week   Barriers to discharge        Co-evaluation               AM-PAC PT "6 Clicks" Daily Activity  Outcome Measure Difficulty turning over in bed (including adjusting bedclothes, sheets and blankets)?: A Lot Difficulty moving from lying on back to sitting on the side of the bed? : A Lot Difficulty sitting down on and standing up  from a chair with arms (e.g., wheelchair, bedside commode, etc,.)?: A Little Help needed moving to and from a bed to chair (including a wheelchair)?: A Little Help needed walking in hospital room?: A Little Help needed climbing 3-5 steps with a railing? : A Lot 6 Click Score: 15    End of Session Equipment Utilized During Treatment: Gait belt Activity Tolerance: Patient limited by pain Patient left: in chair;with chair alarm set;with call bell/phone within reach;with family/visitor present Nurse Communication: Mobility status;Other (comment) (Need of OT consult) PT Visit Diagnosis: Unsteadiness on feet (R26.81);Repeated falls (R29.6);Muscle weakness (generalized) (M62.81);Pain Pain - part of body:  (Chest (due to sternal fracture) and L knee)    Time: 6301-6010 PT Time Calculation (min) (ACUTE ONLY): 27 min   Charges:         PT G Codes:        Donaciano Eva, PT, SPT  Ranvir Renovato 10/11/2016, 11:26 AM

## 2016-10-11 NOTE — Discharge Instructions (Addendum)
Resume diet and activity as tolerated.  You will need repeat potassium level checked by your doctor in 1 week   Information on my medicine - ELIQUIS (apixaban)  This medication education was reviewed with me or my healthcare representative as part of my discharge preparation.  The pharmacist that spoke with me during my hospital stay was:  Ramond Dial, [facility]  Why was Eliquis prescribed for you? Eliquis was prescribed for you to reduce the risk of a blood clot forming that can cause a stroke if you have a medical condition called atrial fibrillation (a type of irregular heartbeat).  What do You need to know about Eliquis ? Take your Eliquis TWICE DAILY - one tablet in the morning and one tablet in the evening with or without food. If you have difficulty swallowing the tablet whole please discuss with your pharmacist how to take the medication safely.  Take Eliquis exactly as prescribed by your doctor and DO NOT stop taking Eliquis without talking to the doctor who prescribed the medication.  Stopping may increase your risk of developing a stroke.  Refill your prescription before you run out.  After discharge, you should have regular check-up appointments with your healthcare provider that is prescribing your Eliquis.  In the future your dose may need to be changed if your kidney function or weight changes by a significant amount or as you get older.  What do you do if you miss a dose? If you miss a dose, take it as soon as you remember on the same day and resume taking twice daily.  Do not take more than one dose of ELIQUIS at the same time to make up a missed dose.  Important Safety Information A possible side effect of Eliquis is bleeding. You should call your healthcare provider right away if you experience any of the following: ? Bleeding from an injury or your nose that does not stop. ? Unusual colored urine (red or dark brown) or unusual colored stools (red or  black). ? Unusual bruising for unknown reasons. ? A serious fall or if you hit your head (even if there is no bleeding).  Some medicines may interact with Eliquis and might increase your risk of bleeding or clotting while on Eliquis. To help avoid this, consult your healthcare provider or pharmacist prior to using any new prescription or non-prescription medications, including herbals, vitamins, non-steroidal anti-inflammatory drugs (NSAIDs) and supplements.  This website has more information on Eliquis (apixaban): http://www.eliquis.com/eliquis/home

## 2016-10-12 ENCOUNTER — Telehealth: Payer: Self-pay | Admitting: *Deleted

## 2016-10-12 LAB — HEMOGLOBIN A1C
Hgb A1c MFr Bld: 7.5 % — ABNORMAL HIGH (ref 4.8–5.6)
MEAN PLASMA GLUCOSE: 169 mg/dL

## 2016-10-12 NOTE — Telephone Encounter (Signed)
Attempted to contact pt; unable to leave vm to complete TCM

## 2016-10-12 NOTE — Discharge Summary (Signed)
[facility] NAME: [name]    MR#:  [id number]  DATE OF BIRTH:  1932/01/05  DATE OF ADMISSION:  10/10/2016 ADMITTING PHYSICIAN: Demetrios Loll, MD  DATE OF DISCHARGE: 10/11/2016  3:23 PM  PRIMARY CARE PHYSICIAN: Jearld Fenton, NP   ADMISSION DIAGNOSIS:  Hypokalemia [E87.6] Lung mass [R91.8] Prolonged Q-T interval on ECG [R94.31] Elevated troponin [R74.8] Skin tear of right elbow without complication, initial encounter [S51.011A] Closed fracture of sternum, unspecified portion of sternum, initial encounter [S22.20XA]  DISCHARGE DIAGNOSIS:  Active Problems:   Hypokalemia   SECONDARY DIAGNOSIS:   Past Medical History:  Diagnosis Date  . Arthritis   . CAD    a. MI 01/29/1996 tx'd w/ TPA @ [facility]; b. Myoview 06/2005: EF 50%, scar @ apex, mild peri-infarct ischemia  . Cancer ([place])    skin  . Chronic atrial fibrillation ([place])    a. since 2006; b. on warfarin  . Chronic diastolic CHF (congestive heart failure) ([place])    a. echo 04/2006: EF lower limits of nl, mod LVH, mild aortic root dilatation, & mild MR, biatrial enlargement; b. echo 04/2013: EF 60%, mod dilated LA, mild MR & TR, mod pulm HTN w/ RV systolic pressure 53, c. echo 04/21/14: EF 55-60%, unable to exclude WMA, severely dilated LA 6.6 cm, nl RVSP, mildly dilated aortic root  . COPD (chronic obstructive pulmonary disease) ([place])   . CVA 2409,7353   x2  . DM   . Falls   . GERD (gastroesophageal reflux disease)   . History of kidney stones   . HYPERLIPIDEMIA   . HYPERTENSION   . Kidney stone    a. s/p left ureteral stenting 04/24/14  . Neuropathy of both feet   . Poor balance      ADMITTING HISTORY  HPI [name]. is a 82 y.o. male who fell this morning after getting up from bed  Patient reports he got up this morning he felt as a skid legs kind of gave way. He fell 4 striking his head on the dresser and also his chest. Reports he has pain across the front  of his face with a small abrasion also pain over the center of his chest when he takes a deep breath.  Denies heavy chest pressure. Reports pain is worse when he moves or tries to deep breath. He has a small abrasion of the right arm. He takes a blood thinner.  No nausea vomiting. No weakness numbness or tingling  HOSPITAL COURSE:   * Hypokalemia with weakness. Patient has chronic hypokalemia and is on potassium supplement 60 mEq daily. Due to his gait abnormalities and weakness from hypokalemia patient had a fall hitting his chest. Potassium was replaced. Improved. For the potassium supplements given on day of discharge. Increase potassium from 60 mEq daily to 40 mEq twice a day. Patient will follow-up with primary care physician within a week for repeat potassium levels.  * Sternal fracture, nondisplaced No complications. Monitor overnight on telemetry. Pain control with pain medications.  Patient's other comorbidities remained stable. Continue all home medications.  Discharged home in stable condition with home health physical therapy.  CONSULTS OBTAINED:    DRUG ALLERGIES:   Allergies  Allergen Reactions  . Contrast Media [Iodinated Diagnostic Agents] Shortness Of Breath  . Morphine And Related Other (See Comments)    Hallucinations   . Niacin And Related Dermatitis    DISCHARGE MEDICATIONS:   Discharge Medication List  as of 10/11/2016  2:48 PM    START taking these medications   Details  HYDROcodone-acetaminophen (NORCO/VICODIN) 5-325 MG tablet Take 1 tablet by mouth every 6 (six) hours as needed for severe pain., Starting Mon 10/11/2016, Print      CONTINUE these medications which have CHANGED   Details  Potassium Chloride ER 20 MEQ TBCR Take 40 mEq by mouth 2 (two) times daily., Starting Mon 10/11/2016, Normal      CONTINUE these medications which have NOT CHANGED   Details  ALPRAZolam (XANAX) 0.25 MG tablet Take 1 tablet (0.25 mg total) by mouth at bedtime as  needed for anxiety., Starting Mon 06/14/2016, No Print    atorvastatin (LIPITOR) 40 MG tablet TAKE 1 TABLET DAILY, Normal    budesonide-formoterol (SYMBICORT) 160-4.5 MCG/ACT inhaler Inhale 2 puffs into the lungs 2 (two) times daily., Starting Mon 10/20/2015, Normal    citalopram (CELEXA) 10 MG tablet TAKE 1 TABLET DAILY, Normal    dorzolamide (TRUSOPT) 2 % ophthalmic solution Place 1 drop into both eyes 3 (three) times daily. , Starting Wed 03/31/2016, Historical Med    ELIQUIS 5 MG TABS tablet TAKE 1 TABLET TWICE A DAY, Normal    ezetimibe (ZETIA) 10 MG tablet TAKE 1 TABLET DAILY, Normal    finasteride (PROSCAR) 5 MG tablet TAKE 1 TABLET DAILY, Normal    gabapentin (NEURONTIN) 100 MG capsule TAKE 1 CAPSULE THREE TIMES A DAY, Normal    glipiZIDE (GLUCOTROL) 10 MG tablet TAKE 1 TABLET TWICE A DAY BEFORE MEALS, Normal    HUMALOG KWIKPEN 100 UNIT/ML KiwkPen INJECT 18 UNITS WITH BREAKFAST, 20 UNITS WITH LUNCH, AND 16 UNITS WITH DINNER, Normal    Insulin Detemir (LEVEMIR FLEXPEN) 100 UNIT/ML Pen Inject 42 Units into the skin daily at 10 pm., Starting Tue 08/17/2016, Phone In    JANUVIA 100 MG tablet TAKE 1 TABLET DAILY, Normal    latanoprost (XALATAN) 0.005 % ophthalmic solution Place 1 drop into both eyes at bedtime. , Starting Sun 04/18/2016, Historical Med    levothyroxine (SYNTHROID, LEVOTHROID) 25 MCG tablet Take 1 tablet (25 mcg total) by mouth daily before breakfast., Starting Thu 08/19/2016, Normal    metoprolol succinate (TOPROL-XL) 50 MG 24 hr tablet TAKE 1 TABLET TWICE A DAY, Normal    Omega-3 Fatty Acids (FISH OIL) 1000 MG CAPS Take 1 capsule by mouth 3 (three) times daily. , Historical Med    tamsulosin (FLOMAX) 0.4 MG CAPS capsule TAKE 1 CAPSULE DAILY, Normal    tiotropium (SPIRIVA HANDIHALER) 18 MCG inhalation capsule Place 1 capsule (18 mcg total) into inhaler and inhale daily., Starting Wed 02/11/2016, Normal    torsemide (DEMADEX) 20 MG tablet Take 80 mg by mouth daily. ,  Historical Med    acetaminophen (TYLENOL) 650 MG CR tablet Take 650 mg by mouth every 8 (eight) hours as needed.  , Historical Med    albuterol (2.5 MG/3ML) 0.083% NEBU 3 mL, albuterol (5 MG/ML) 0.5% NEBU 0.5 mL Inhale 1 mg into the lungs., Historical Med    BD PEN NEEDLE NANO U/F 32G X 4 MM MISC USE THREE TIMES A DAY FOR INSULIN ADMINISTRATION, Normal    Diclofenac Sodium 1.5 % SOLN Place 2 mLs onto the skin 4 (four) times daily., Starting Thu 07/15/2016, Normal    Insulin Syringe-Needle U-100 30G X 1/2" 1 ML MISC 1 each by Does not apply route 3 (three) times daily., Starting Fri 07/19/2014, Normal    metolazone (ZAROXOLYN) 5 MG tablet TAKE 1 TABLET DAILY AS  NEEDED, Normal    nitroGLYCERIN (NITROSTAT) 0.4 MG SL tablet Place 0.4 mg under the tongue every 5 (five) minutes as needed.  , Historical Med    nystatin-triamcinolone (MYCOLOG II) cream Apply 1 application topically 2 (two) times daily. , Starting Tue 03/12/2014, Historical Med    predniSONE (DELTASONE) 10 MG tablet Take 3 tabs on days 1-3, take 2 tabs on days 4-6, take 1 tab on days 7-9, Normal    PROAIR HFA 108 (90 Base) MCG/ACT inhaler USE 1 TO 2 INHALATIONS EVERY 4 HOURS AS NEEDED, Normal        Today   VITAL SIGNS:  Blood pressure (!) 114/59, pulse 68, temperature 97.5 F (36.4 C), temperature source Oral, resp. rate 18, height 5\' 6"  (1.676 m), weight 83 kg (183 lb), SpO2 95 %.  I/O:   Intake/Output Summary (Last 24 hours) at 10/12/16 1345 Last data filed at 10/11/16 1400  Gross per 24 hour  Intake              240 ml  Output                0 ml  Net              240 ml    PHYSICAL EXAMINATION:  Physical Exam  GENERAL:  83 y.o.-year-old patient lying in the bed with no acute distress.  LUNGS: Normal breath sounds bilaterally, no wheezing, rales,rhonchi or crepitation. No use of accessory muscles of respiration.  CARDIOVASCULAR: S1, S2 normal. No murmurs, rubs, or gallops.  ABDOMEN: Soft, non-tender,  non-distended. Bowel sounds present. No organomegaly or mass.  NEUROLOGIC: Moves all 4 extremities. PSYCHIATRIC: The patient is alert and oriented x 3.  SKIN: No obvious rash, lesion, or ulcer.   DATA REVIEW:   CBC  Recent Labs Lab 10/11/16 0458  WBC 16.2*  HGB 15.1  HCT 43.6  PLT 243    Chemistries   Recent Labs Lab 10/10/16 0745 10/11/16 0458  NA 137 140  K 2.5* 3.1*  CL 93* 101  CO2 32 31  GLUCOSE 147* 136*  BUN 48* 42*  CREATININE 1.87* 1.37*  CALCIUM 9.1 8.6*  MG 2.2  --     Cardiac Enzymes  Recent Labs Lab 10/10/16 1140  TROPONINI 0.03*    Microbiology Results  Results for orders placed or performed during the hospital encounter of 10/09/14  Blood culture (routine x 2)     Status: None   Collection Time: 10/10/14  1:15 AM  Result Value Ref Range Status   Specimen Description BLOOD LEFT FATTY CASTS  Final   Special Requests BOTTLES DRAWN AEROBIC AND ANAEROBIC 25 CC  Final   Culture NO GROWTH 5 DAYS  Final   Report Status 10/15/2014 FINAL  Final  Blood culture (routine x 2)     Status: None   Collection Time: 10/10/14  1:15 AM  Result Value Ref Range Status   Specimen Description BLOOD RIGHT FATTY CASTS  Final   Special Requests BOTTLES DRAWN AEROBIC AND ANAEROBIC 25 CC  Final   Culture NO GROWTH 5 DAYS  Final   Report Status 10/15/2014 FINAL  Final    RADIOLOGY:  No results found.  Follow up with PCP in 1 week.  Management plans discussed with the patient, family and they are in agreement.  CODE STATUS:  Code Status History    Date Active Date Inactive Code Status Order ID Comments User Context   10/10/2016  3:20 PM 10/11/2016  6:29  PM Full Code [id number]  Demetrios Loll, MD Inpatient   10/10/2014  2:58 AM 10/11/2014  5:04 PM Full Code [id number]  Harrie Foreman, MD Inpatient      TOTAL TIME TAKING CARE OF THIS PATIENT ON DAY OF DISCHARGE: more than 30 minutes.   Hillary Bow R M.D on 10/12/2016 at 1:45 PM  Between 7am to 6pm - Pager -  [phone]  After 6pm go to [email or web address] - password [email or web address] [email or web address]  Office  [phone]  CC: Primary care physician; Jearld Fenton, NP  Note: This dictation was prepared with Dragon dictation along with smaller phrase technology. Any transcriptional errors that result from this process are unintentional.

## 2016-10-13 ENCOUNTER — Telehealth: Payer: Self-pay | Admitting: Cardiology

## 2016-10-13 NOTE — Telephone Encounter (Signed)
The RN at home called due to wt. Gain and SOB.  Pt recently discharged after fall and hitting  Chest on dresser so + chest wall pain and makes it difficult to talk.   He was not on diuretic in the hospital or CPAP now on both and K+ - he has started voiding and pt and wife per wife think he is doing fine.  Instructed to cal back if swelling of LE does not improve.  They agree, will ask office to see earlier.

## 2016-10-14 NOTE — Telephone Encounter (Signed)
Attempted to contact pt; unable to leave vm to complete TCM

## 2016-10-15 ENCOUNTER — Other Ambulatory Visit: Payer: Self-pay | Admitting: Family

## 2016-10-15 ENCOUNTER — Other Ambulatory Visit: Payer: Self-pay | Admitting: Family Medicine

## 2016-10-18 ENCOUNTER — Ambulatory Visit (INDEPENDENT_AMBULATORY_CARE_PROVIDER_SITE_OTHER): Payer: Medicare Other | Admitting: Internal Medicine

## 2016-10-18 ENCOUNTER — Encounter: Payer: Self-pay | Admitting: Internal Medicine

## 2016-10-18 VITALS — BP 114/76 | HR 70 | Temp 97.7°F | Wt 188.5 lb

## 2016-10-18 DIAGNOSIS — T502X5A Adverse effect of carbonic-anhydrase inhibitors, benzothiadiazides and other diuretics, initial encounter: Secondary | ICD-10-CM

## 2016-10-18 DIAGNOSIS — E876 Hypokalemia: Secondary | ICD-10-CM

## 2016-10-18 DIAGNOSIS — R531 Weakness: Secondary | ICD-10-CM | POA: Diagnosis not present

## 2016-10-18 DIAGNOSIS — S2220XD Unspecified fracture of sternum, subsequent encounter for fracture with routine healing: Secondary | ICD-10-CM | POA: Diagnosis not present

## 2016-10-18 DIAGNOSIS — R296 Repeated falls: Secondary | ICD-10-CM | POA: Diagnosis not present

## 2016-10-18 DIAGNOSIS — Z742 Need for assistance at home and no other household member able to render care: Secondary | ICD-10-CM | POA: Diagnosis not present

## 2016-10-18 LAB — BASIC METABOLIC PANEL
BUN: 48 mg/dL — ABNORMAL HIGH (ref 6–23)
CHLORIDE: 95 meq/L — AB (ref 96–112)
CO2: 35 mEq/L — ABNORMAL HIGH (ref 19–32)
Calcium: 10.4 mg/dL (ref 8.4–10.5)
Creatinine, Ser: 1.71 mg/dL — ABNORMAL HIGH (ref 0.40–1.50)
GFR: 40.85 mL/min — ABNORMAL LOW (ref >60.00)
Glucose, Bld: 158 mg/dL — ABNORMAL HIGH (ref 70–99)
POTASSIUM: 3.8 meq/L (ref 3.5–5.1)
SODIUM: 137 meq/L (ref 135–145)

## 2016-10-18 MED ORDER — HYDROCODONE-ACETAMINOPHEN 5-325 MG PO TABS: 1.0000 | ORAL_TABLET | Freq: Four times a day (QID) | ORAL | 0 refills | Status: DC | PRN

## 2016-10-18 NOTE — Progress Notes (Signed)
Subjective:    Patient ID: [name]., male    DOB: December 28, 1931, 83 y.o.   MRN: [id number]  HPI  Pt presents to the clinic today for [facility] Followup. He was admitted 7/1-7/2 after a fall in which he hit his head and his chest. He sustained a skin tear to the left arm and a closed sternal fracture. He was found to be hypokalemic due to massive amounts of diuretics. His potassium was replaced. None of his chronic meds were changed. He was sent up with home PT/OT. He was advised to follow up with PCP. Since discharge, he reports he is feeling weak and is still in a lot of pain. He is needing more help with his personal care and is requesting a referral for home health nursing to help. He is also requesting a refill of Norco.  Review of Systems       Past Medical History:  Diagnosis Date  . Arthritis   . CAD    a. MI 01/29/1996 tx'd w/ TPA @ [facility]; b. Myoview 06/2005: EF 50%, scar @ apex, mild peri-infarct ischemia  . Cancer ([place])    skin  . Chronic atrial fibrillation ([place])    a. since 2006; b. on warfarin  . Chronic diastolic CHF (congestive heart failure) ([place])    a. echo 04/2006: EF lower limits of nl, mod LVH, mild aortic root dilatation, & mild MR, biatrial enlargement; b. echo 04/2013: EF 60%, mod dilated LA, mild MR & TR, mod pulm HTN w/ RV systolic pressure 53, c. echo 04/21/14: EF 55-60%, unable to exclude WMA, severely dilated LA 6.6 cm, nl RVSP, mildly dilated aortic root  . COPD (chronic obstructive pulmonary disease) ([place])   . CVA 6789,3810   x2  . DM   . Falls   . GERD (gastroesophageal reflux disease)   . History of kidney stones   . HYPERLIPIDEMIA   . HYPERTENSION   . Kidney stone    a. s/p left ureteral stenting 04/24/14  . Neuropathy of both feet   . Poor balance     Current Outpatient Prescriptions  Medication Sig Dispense Refill  . acetaminophen (TYLENOL) 650 MG CR tablet Take 650 mg by mouth every 8 (eight) hours as needed.      Marland Kitchen albuterol (2.5  MG/3ML) 0.083% NEBU 3 mL, albuterol (5 MG/ML) 0.5% NEBU 0.5 mL Inhale 1 mg into the lungs.    . ALPRAZolam (XANAX) 0.25 MG tablet Take 1 tablet (0.25 mg total) by mouth at bedtime as needed for anxiety. 90 tablet 0  . atorvastatin (LIPITOR) 40 MG tablet TAKE 1 TABLET DAILY 90 tablet 3  . BD PEN NEEDLE NANO U/F 32G X 4 MM MISC USE THREE TIMES A DAY FOR INSULIN ADMINISTRATION 270 each 1  . budesonide-formoterol (SYMBICORT) 160-4.5 MCG/ACT inhaler Inhale 2 puffs into the lungs 2 (two) times daily. 3 Inhaler 3  . citalopram (CELEXA) 10 MG tablet TAKE 1 TABLET DAILY 90 tablet 0  . Diclofenac Sodium 1.5 % SOLN Place 2 mLs onto the skin 4 (four) times daily. 150 mL 5  . dorzolamide (TRUSOPT) 2 % ophthalmic solution Place 1 drop into both eyes 3 (three) times daily.     Marland Kitchen ELIQUIS 5 MG TABS tablet TAKE 1 TABLET TWICE A DAY 180 tablet 3  . ezetimibe (ZETIA) 10 MG tablet TAKE 1 TABLET DAILY 90 tablet 1  . finasteride (PROSCAR) 5 MG tablet TAKE 1 TABLET DAILY 90 tablet 4  . gabapentin (  NEURONTIN) 100 MG capsule TAKE 1 CAPSULE THREE TIMES A DAY 270 capsule 1  . glipiZIDE (GLUCOTROL) 10 MG tablet TAKE 1 TABLET TWICE A DAY BEFORE MEALS 180 tablet 0  . HUMALOG KWIKPEN 100 UNIT/ML KiwkPen INJECT 18 UNITS WITH BREAKFAST, 20 UNITS WITH LUNCH, AND 16 UNITS WITH DINNER 60 mL 0  . HYDROcodone-acetaminophen (NORCO/VICODIN) 5-325 MG tablet Take 1 tablet by mouth every 6 (six) hours as needed for severe pain. 30 tablet 0  . Insulin Detemir (LEVEMIR FLEXPEN) 100 UNIT/ML Pen Inject 42 Units into the skin daily at 10 pm. 15 pen 1  . Insulin Syringe-Needle U-100 30G X 1/2" 1 ML MISC 1 each by Does not apply route 3 (three) times daily. 200 each 5  . JANUVIA 100 MG tablet TAKE 1 TABLET DAILY 90 tablet 1  . latanoprost (XALATAN) 0.005 % ophthalmic solution Place 1 drop into both eyes at bedtime.     Marland Kitchen levothyroxine (SYNTHROID, LEVOTHROID) 25 MCG tablet Take 1 tablet (25 mcg total) by mouth daily before breakfast. (Patient  taking differently: Take 50 mcg by mouth daily before breakfast. ) 90 tablet 0  . metolazone (ZAROXOLYN) 5 MG tablet TAKE 1 TABLET DAILY AS NEEDED 90 tablet 2  . metoprolol succinate (TOPROL-XL) 50 MG 24 hr tablet TAKE 1 TABLET TWICE A DAY 180 tablet 0  . nitroGLYCERIN (NITROSTAT) 0.4 MG SL tablet Place 0.4 mg under the tongue every 5 (five) minutes as needed.      . nystatin-triamcinolone (MYCOLOG II) cream Apply 1 application topically 2 (two) times daily.     . Omega-3 Fatty Acids (FISH OIL) 1000 MG CAPS Take 1 capsule by mouth 3 (three) times daily.     . Potassium Chloride ER 20 MEQ TBCR Take 40 mEq by mouth 2 (two) times daily. 120 tablet 0  . predniSONE (DELTASONE) 10 MG tablet Take 3 tabs on days 1-3, take 2 tabs on days 4-6, take 1 tab on days 7-9 18 tablet 0  . PROAIR HFA 108 (90 Base) MCG/ACT inhaler USE 1 TO 2 INHALATIONS EVERY 4 HOURS AS NEEDED 25.5 g 4  . tamsulosin (FLOMAX) 0.4 MG CAPS capsule TAKE 1 CAPSULE DAILY 90 capsule 1  . tiotropium (SPIRIVA HANDIHALER) 18 MCG inhalation capsule Place 1 capsule (18 mcg total) into inhaler and inhale daily. 90 capsule 3  . torsemide (DEMADEX) 20 MG tablet Take 80 mg by mouth daily.      No current facility-administered medications for this visit.     Allergies  Allergen Reactions  . Contrast Media [Iodinated Diagnostic Agents] Shortness Of Breath  . Morphine And Related Other (See Comments)    Hallucinations   . Niacin And Related Dermatitis    Family History  Problem Relation Age of Onset  . Heart disease Mother   . Heart disease Maternal Grandmother   . Diabetes Maternal Grandmother   . Cancer Neg Hx   . Stroke Neg Hx     Social History   Social History  . Marital status: Married    Spouse name: N/A  . Number of children: N/A  . Years of education: N/A   Occupational History  . Not on file.   Social History Main Topics  . Smoking status: Former Smoker    Packs/day: 2.00    Years: 40.00    Types: Cigarettes     Quit date: 05/24/1990  . Smokeless tobacco: Former Systems developer    Quit date: 05/24/1990  . Alcohol use No  . Drug use:  No  . Sexual activity: Not Currently   Other Topics Concern  . Not on file   Social History Narrative  . No narrative on file     Constitutional: Denies fever, malaise, fatigue, headache or abrupt weight changes.  Respiratory: Denies difficulty breathing, shortness of breath, cough or sputum production.   Musculoskeletal: Pt reports sternal pain and generalized weakness. Denies decrease in range of motion,  muscle pain or joint swelling.  Skin: Pt reports abrasion to left hand.   Neurological: Pt reports difficulty with balance. Denies dizziness, difficulty with memory, difficulty with speech or problems with coordination.    No other specific complaints in a complete review of systems (except as listed in HPI above).  Objective:   Physical Exam   BP 114/76   Pulse 70   Temp 97.7 F (36.5 C) (Oral)   Wt 188 lb 8 oz (85.5 kg)   SpO2 97%   BMI 30.42 kg/m  Wt Readings from Last 3 Encounters:  10/18/16 188 lb 8 oz (85.5 kg)  10/10/16 183 lb (83 kg)  09/13/16 184 lb 12 oz (83.8 kg)    General: Appears his stated age, chronically ill appearing, in NAD. Skin: Bruising noted over sternum. Cardiovascular: Normal rate but irregular rhythm.  Pulmonary/Chest: Normal effort and positive vesicular breath sounds. No respiratory distress. No wheezes, rales or ronchi noted.  Musculoskeletal: Gait slow, unsteady even with use of rolling walker.  Neurological: Alert and oriented.     BMET    Component Value Date/Time   NA 140 10/11/2016 0458   NA 143 02/21/2015 1108   NA 137 08/05/2014 1839   K 3.1 (L) 10/11/2016 0458   K 3.6 08/05/2014 1839   CL 101 10/11/2016 0458   CL 106 08/05/2014 1839   CO2 31 10/11/2016 0458   CO2 23 08/05/2014 1839   GLUCOSE 136 (H) 10/11/2016 0458   GLUCOSE 235 (H) 08/05/2014 1839   BUN 42 (H) 10/11/2016 0458   BUN 26 02/21/2015 1108    BUN 19 08/05/2014 1839   CREATININE 1.37 (H) 10/11/2016 0458   CREATININE 1.20 08/05/2014 1839   CALCIUM 8.6 (L) 10/11/2016 0458   CALCIUM 8.6 (L) 08/05/2014 1839   GFRNONAA 46 (L) 10/11/2016 0458   GFRNONAA 57 (L) 08/05/2014 1839   GFRAA 54 (L) 10/11/2016 0458   GFRAA >60 08/05/2014 1839    Lipid Panel     Component Value Date/Time   CHOL 120 08/05/2015 1135   TRIG 208.0 (H) 08/05/2015 1135   HDL 32.10 (L) 08/05/2015 1135   CHOLHDL 4 08/05/2015 1135   VLDL 41.6 (H) 08/05/2015 1135   LDLCALC 47 10/04/2014 0940    CBC    Component Value Date/Time   WBC 16.2 (H) 10/11/2016 0458   RBC 5.26 10/11/2016 0458   HGB 15.1 10/11/2016 0458   HGB 13.3 08/05/2014 1839   HCT 43.6 10/11/2016 0458   HCT 40.1 08/05/2014 1839   PLT 243 10/11/2016 0458   PLT 246 08/05/2014 1839   MCV 82.9 10/11/2016 0458   MCV 83 08/05/2014 1839   MCH 28.7 10/11/2016 0458   MCHC 34.6 10/11/2016 0458   RDW 17.0 (H) 10/11/2016 0458   RDW 16.3 (H) 08/05/2014 1839   LYMPHSABS 3.3 05/20/2016 1041   LYMPHSABS 2.3 08/05/2014 1839   MONOABS 1.7 (H) 05/20/2016 1041   MONOABS 2.0 (H) 08/05/2014 1839   EOSABS 0.2 05/20/2016 1041   EOSABS 0.1 08/05/2014 1839   BASOSABS 0.1 05/20/2016 1041   BASOSABS 0.1  08/05/2014 1839    Hgb A1C Lab Results  Component Value Date   HGBA1C 7.5 (H) 10/10/2016           Assessment & Plan:   [facility] Follow Up for Fall at Home, Sternal Fracture:  Hospital notes, labs and imaging reviewed with pt. BMET today Will adjust potassium if needed Norco refilled today Referral placed to home health nursing services  Return precautions discussed. Webb Silversmith, NP

## 2016-10-18 NOTE — Patient Instructions (Signed)

## 2016-10-19 ENCOUNTER — Telehealth: Payer: Self-pay | Admitting: Internal Medicine

## 2016-10-19 ENCOUNTER — Telehealth: Payer: Self-pay

## 2016-10-19 NOTE — Telephone Encounter (Signed)
Pt is going to check with his son, who is his transportation, and call us back to schedule.

## 2016-10-19 NOTE — Telephone Encounter (Signed)
Jeani Hawking from [facility] called requesting order for home health nurse. Can reach her at [phone] or [phone].

## 2016-10-19 NOTE — Telephone Encounter (Signed)
I placed this order yesterday. Does she need a verbal order as well?

## 2016-10-19 NOTE — Telephone Encounter (Signed)
-----   Message from Minna Merritts, MD sent at 10/17/2016  1:57 PM EDT -----   ----- Message ----- From: Isaiah Serge, NP Sent: 10/13/2016  12:32 PM To: Minna Merritts, MD  Mr. Catalfamo was hospitalized for fall and injury to chest wall and just d/c'd on the 2nd of July, now with edema but improving.  He may need follow up before Sept.

## 2016-10-19 NOTE — Telephone Encounter (Signed)
[name] stated she did not see order and verbal order given

## 2016-10-21 ENCOUNTER — Other Ambulatory Visit: Payer: Self-pay

## 2016-10-21 ENCOUNTER — Other Ambulatory Visit: Payer: Self-pay | Admitting: Internal Medicine

## 2016-10-21 DIAGNOSIS — F419 Anxiety disorder, unspecified: Secondary | ICD-10-CM

## 2016-10-21 MED ORDER — ALPRAZOLAM 0.25 MG PO TABS: 0.2500 mg | ORAL_TABLET | Freq: Every evening | ORAL | 0 refills | Status: DC | PRN

## 2016-10-21 NOTE — Telephone Encounter (Signed)
Last filled 06/14/16 #90--- please advise--- faxed form placed in your box for signature to fax back to mail order

## 2016-10-21 NOTE — Telephone Encounter (Signed)
Form signed and placed in MYD box

## 2016-10-22 ENCOUNTER — Ambulatory Visit: Payer: Medicare Other | Admitting: Urology

## 2016-10-22 ENCOUNTER — Other Ambulatory Visit: Payer: Self-pay

## 2016-10-22 NOTE — Telephone Encounter (Signed)
Rx faxed back to mail order

## 2016-10-22 NOTE — Telephone Encounter (Signed)
Xanax Rx faxed to mail order for 90 day supply

## 2016-10-26 ENCOUNTER — Telehealth: Payer: Self-pay

## 2016-10-26 MED ORDER — LEVOTHYROXINE SODIUM 50 MCG PO TABS: 50.0000 ug | ORAL_TABLET | Freq: Every day | ORAL | 0 refills | Status: DC

## 2016-10-26 NOTE — Telephone Encounter (Signed)
Pt is running out of med since taking levothyroxine 25 mcg taking 2 tabs daily; per lab result note 10/04/16 levothyroxine 50 mcg # 90 x [facility]. Pt has appt on 11/02/16 for TSH labs. Pt notified refill done.pt will keep lab appt on 11/02/16.

## 2016-11-02 ENCOUNTER — Other Ambulatory Visit (INDEPENDENT_AMBULATORY_CARE_PROVIDER_SITE_OTHER): Payer: Medicare Other

## 2016-11-02 ENCOUNTER — Telehealth: Payer: Self-pay

## 2016-11-02 DIAGNOSIS — R7989 Other specified abnormal findings of blood chemistry: Secondary | ICD-10-CM

## 2016-11-02 DIAGNOSIS — R946 Abnormal results of thyroid function studies: Secondary | ICD-10-CM

## 2016-11-02 LAB — TSH: TSH: 3.85 u[IU]/mL (ref 0.35–4.50)

## 2016-11-02 NOTE — Telephone Encounter (Signed)
[name] PT with [facility] requesting verbal orders for additional 4 more visits for [facility] PT so pt will reach goal.Please advise.

## 2016-11-02 NOTE — Telephone Encounter (Signed)
Ok to continue home health PT

## 2016-11-03 NOTE — Telephone Encounter (Signed)
VO given.

## 2016-11-08 ENCOUNTER — Encounter: Payer: Self-pay | Admitting: Internal Medicine

## 2016-11-09 ENCOUNTER — Encounter: Payer: Self-pay | Admitting: Internal Medicine

## 2016-11-09 MED ORDER — PREDNISONE 10 MG PO TABS: 10.0000 mg | ORAL_TABLET | Freq: Every day | ORAL | 1 refills | Status: DC

## 2016-11-09 NOTE — Addendum Note (Signed)
Addended by: Lurlean Nanny on: 11/09/2016 12:01 PM   Modules accepted: Orders

## 2016-11-10 ENCOUNTER — Telehealth: Payer: Self-pay | Admitting: Urology

## 2016-11-10 NOTE — Telephone Encounter (Signed)
Sherlynn Stalls from [facility] called office stating Pt missed visit, therapist Sherlynn Stalls) requesting 2 more visits next week to complete plan of care. Please advise. Thanks. [phone].

## 2016-11-10 NOTE — Telephone Encounter (Signed)
Spoke with Sherlynn Stalls and made aware pt has not been seen in over a year. Sherlynn Stalls voiced understanding stating she would call pt PCP.

## 2016-11-10 NOTE — Telephone Encounter (Signed)
I have not seen this patient in over a year.  They have called the wrong office.  Hollice Espy, MD

## 2016-11-11 ENCOUNTER — Telehealth: Payer: Self-pay

## 2016-11-11 NOTE — Telephone Encounter (Signed)
Esther OT with [facility] left v.m requesting verbal order for [facility] OT 2 x a week for 1 week. Pt did not have visit last week due to knee pain.Please advise.

## 2016-11-12 NOTE — Telephone Encounter (Signed)
[name] notified as instructed and Sherlynn Stalls voiced understanding.

## 2016-11-12 NOTE — Telephone Encounter (Signed)
Ok to continue OT

## 2016-11-22 ENCOUNTER — Encounter: Payer: Self-pay | Admitting: Internal Medicine

## 2016-11-22 ENCOUNTER — Ambulatory Visit (INDEPENDENT_AMBULATORY_CARE_PROVIDER_SITE_OTHER): Payer: Medicare Other | Admitting: Internal Medicine

## 2016-11-22 VITALS — BP 112/64 | HR 58 | Temp 97.9°F | Ht 66.0 in | Wt 183.5 lb

## 2016-11-22 DIAGNOSIS — F5104 Psychophysiologic insomnia: Secondary | ICD-10-CM | POA: Diagnosis not present

## 2016-11-22 DIAGNOSIS — N401 Enlarged prostate with lower urinary tract symptoms: Secondary | ICD-10-CM | POA: Diagnosis not present

## 2016-11-22 DIAGNOSIS — I2510 Atherosclerotic heart disease of native coronary artery without angina pectoris: Secondary | ICD-10-CM

## 2016-11-22 DIAGNOSIS — Z0000 Encounter for general adult medical examination without abnormal findings: Secondary | ICD-10-CM | POA: Diagnosis not present

## 2016-11-22 DIAGNOSIS — I482 Chronic atrial fibrillation, unspecified: Secondary | ICD-10-CM

## 2016-11-22 DIAGNOSIS — N3943 Post-void dribbling: Secondary | ICD-10-CM

## 2016-11-22 DIAGNOSIS — I10 Essential (primary) hypertension: Secondary | ICD-10-CM | POA: Diagnosis not present

## 2016-11-22 DIAGNOSIS — I5032 Chronic diastolic (congestive) heart failure: Secondary | ICD-10-CM

## 2016-11-22 DIAGNOSIS — E1159 Type 2 diabetes mellitus with other circulatory complications: Secondary | ICD-10-CM

## 2016-11-22 DIAGNOSIS — Z8673 Personal history of transient ischemic attack (TIA), and cerebral infarction without residual deficits: Secondary | ICD-10-CM | POA: Diagnosis not present

## 2016-11-22 DIAGNOSIS — M179 Osteoarthritis of knee, unspecified: Secondary | ICD-10-CM | POA: Insufficient documentation

## 2016-11-22 DIAGNOSIS — E039 Hypothyroidism, unspecified: Secondary | ICD-10-CM | POA: Diagnosis not present

## 2016-11-22 DIAGNOSIS — J432 Centrilobular emphysema: Secondary | ICD-10-CM | POA: Diagnosis not present

## 2016-11-22 DIAGNOSIS — F419 Anxiety disorder, unspecified: Secondary | ICD-10-CM

## 2016-11-22 DIAGNOSIS — M1710 Unilateral primary osteoarthritis, unspecified knee: Secondary | ICD-10-CM | POA: Insufficient documentation

## 2016-11-22 DIAGNOSIS — E7800 Pure hypercholesterolemia, unspecified: Secondary | ICD-10-CM

## 2016-11-22 DIAGNOSIS — M170 Bilateral primary osteoarthritis of knee: Secondary | ICD-10-CM

## 2016-11-22 DIAGNOSIS — I63419 Cerebral infarction due to embolism of unspecified middle cerebral artery: Secondary | ICD-10-CM

## 2016-11-22 DIAGNOSIS — Z794 Long term (current) use of insulin: Secondary | ICD-10-CM

## 2016-11-22 LAB — LIPID PANEL
CHOLESTEROL: 104 mg/dL (ref 0–200)
HDL: 43.8 mg/dL (ref >39.00)
LDL Cholesterol: 29 mg/dL (ref 0–99)
NonHDL: 59.98
Total CHOL/HDL Ratio: 2
Triglycerides: 154 mg/dL — ABNORMAL HIGH (ref 0.0–149.0)
VLDL: 30.8 mg/dL (ref 0.0–40.0)

## 2016-11-22 LAB — COMPREHENSIVE METABOLIC PANEL
ALBUMIN: 3.7 g/dL (ref 3.5–5.2)
ALK PHOS: 63 U/L (ref 39–117)
ALT: 14 U/L (ref 0–53)
AST: 10 U/L (ref 0–37)
BILIRUBIN TOTAL: 0.7 mg/dL (ref 0.2–1.2)
BUN: 34 mg/dL — ABNORMAL HIGH (ref 6–23)
CO2: 34 mEq/L — ABNORMAL HIGH (ref 19–32)
CREATININE: 1.54 mg/dL — AB (ref 0.40–1.50)
Calcium: 9.3 mg/dL (ref 8.4–10.5)
Chloride: 95 mEq/L — ABNORMAL LOW (ref 96–112)
GFR: 46.09 mL/min — ABNORMAL LOW (ref >60.00)
GLUCOSE: 212 mg/dL — AB (ref 70–99)
POTASSIUM: 3.5 meq/L (ref 3.5–5.1)
SODIUM: 138 meq/L (ref 135–145)
TOTAL PROTEIN: 6.4 g/dL (ref 6.0–8.3)

## 2016-11-22 NOTE — Progress Notes (Signed)
HPI:  Pt presents to the clinic today for his Medicare Wellness Exam. He is also due to follow up chronic conditions.  Arthritis: Mainly in his knees. He falls frequently and has poor balance/difficulty with gait. He just finished working with PT. He is on daily Prednisone at this time. He has declined ortho referral in the past.  HLD with CAD s/p MI: His last LDL was 61,07/2015. He denies chest pain, but has Nitro to take if needed. He is taking Lipitor, Zetia and Fish Oil as prescribed. He consumes a low fat diet. He follows with Dr. Rockey Situ, note from 3/2018reviewed.  Afib: He is taking Metoprolol and Eliquis as prescribed. He has intermittent palpitations, but denies heart racing. He follows with Dr. Rockey Situ, note from 06/2016 reviewed. ECG from 06/2016 reviewed.  CHF, Diastolic: He has SOB with exertion and intermittent leg swelling. He weighs himself daily. He is taking Metoprolol, Torsemide, Metolazone, and KCL as prescribed. Echo from 04/2014 reviewed. He follows with Dr. Rockey Situ, note from 06/2016 reviewed.  COPD: He c/o SOB with exertion. He is taking Symbicort and Spiriva as prescribed. He uses his Albuterol inhaler every morning.  History of Stroke: No residual effect. He is taking Metoprolol and Eliquis as prescribed.  DM 2: His last A1C was 7.5, 10/2016. His sugars are always < 180. He is taking Levemir, Humalog, Glipizide and Januvia. He sees podiatry. His neuropathy is well controlled on Neurontin. His last eye exam was 08/2016. His flu and pneumonia vaccines are UTD.  HTN: His BP today is 112/64. He is on Metoprolol, Torsemide and Metolazone.  BPH: He does have dribbling and nocturia. He is taking Finasteride and Flomax as prescribed.  Insomnia/Anxiety: Chronic but stable on Celexa and Xanax. He denies SI/HI.  Hypothyroidism: He was recently started on Synthroid. He denies any concerns at this time.   Past Medical History:  Diagnosis Date  . Arthritis   . CAD    a. MI 01/29/1996  tx'd w/ TPA @ [facility]; b. Myoview 06/2005: EF 50%, scar @ apex, mild peri-infarct ischemia  . Cancer ([place])    skin  . Chronic atrial fibrillation ([place])    a. since 2006; b. on warfarin  . Chronic diastolic CHF (congestive heart failure) ([place])    a. echo 04/2006: EF lower limits of nl, mod LVH, mild aortic root dilatation, & mild MR, biatrial enlargement; b. echo 04/2013: EF 60%, mod dilated LA, mild MR & TR, mod pulm HTN w/ RV systolic pressure 53, c. echo 04/21/14: EF 55-60%, unable to exclude WMA, severely dilated LA 6.6 cm, nl RVSP, mildly dilated aortic root  . COPD (chronic obstructive pulmonary disease) ([place])   . CVA 9562,1308   x2  . DM   . Falls   . GERD (gastroesophageal reflux disease)   . History of kidney stones   . HYPERLIPIDEMIA   . HYPERTENSION   . Kidney stone    a. s/p left ureteral stenting 04/24/14  . Neuropathy of both feet   . Poor balance     Current Outpatient Prescriptions  Medication Sig Dispense Refill  . acetaminophen (TYLENOL) 650 MG CR tablet Take 650 mg by mouth every 8 (eight) hours as needed.      Marland Kitchen albuterol (2.5 MG/3ML) 0.083% NEBU 3 mL, albuterol (5 MG/ML) 0.5% NEBU 0.5 mL Inhale 1 mg into the lungs.    . ALPRAZolam (XANAX) 0.25 MG tablet Take 1 tablet (0.25 mg total) by mouth at bedtime as needed for anxiety. 90 tablet 0  .  atorvastatin (LIPITOR) 40 MG tablet TAKE 1 TABLET DAILY 90 tablet 3  . BD PEN NEEDLE NANO U/F 32G X 4 MM MISC USE THREE TIMES A DAY FOR INSULIN ADMINISTRATION 270 each 1  . budesonide-formoterol (SYMBICORT) 160-4.5 MCG/ACT inhaler Inhale 2 puffs into the lungs 2 (two) times daily. 3 Inhaler 3  . citalopram (CELEXA) 10 MG tablet TAKE 1 TABLET DAILY 90 tablet 0  . Diclofenac Sodium 1.5 % SOLN Place 2 mLs onto the skin 4 (four) times daily. 150 mL 5  . dorzolamide (TRUSOPT) 2 % ophthalmic solution Place 1 drop into both eyes 3 (three) times daily.     Marland Kitchen ELIQUIS 5 MG TABS tablet TAKE 1 TABLET TWICE A DAY 180 tablet 3  . ezetimibe (ZETIA)  10 MG tablet TAKE 1 TABLET DAILY 90 tablet 1  . finasteride (PROSCAR) 5 MG tablet TAKE 1 TABLET DAILY 90 tablet 4  . gabapentin (NEURONTIN) 100 MG capsule TAKE 1 CAPSULE THREE TIMES A DAY 270 capsule 1  . glipiZIDE (GLUCOTROL) 10 MG tablet TAKE 1 TABLET TWICE A DAY BEFORE MEALS 180 tablet 0  . HUMALOG KWIKPEN 100 UNIT/ML KiwkPen INJECT 18 UNITS WITH BREAKFAST, 20 UNITS WITH LUNCH, AND 16 UNITS WITH DINNER 60 mL 0  . HYDROcodone-acetaminophen (NORCO/VICODIN) 5-325 MG tablet Take 1 tablet by mouth every 6 (six) hours as needed for severe pain. 30 tablet 0  . Insulin Detemir (LEVEMIR FLEXPEN) 100 UNIT/ML Pen Inject 42 Units into the skin daily at 10 pm. 15 pen 1  . Insulin Syringe-Needle U-100 30G X 1/2" 1 ML MISC 1 each by Does not apply route 3 (three) times daily. 200 each 5  . JANUVIA 100 MG tablet TAKE 1 TABLET DAILY 90 tablet 1  . latanoprost (XALATAN) 0.005 % ophthalmic solution Place 1 drop into both eyes at bedtime.     Marland Kitchen levothyroxine (SYNTHROID, LEVOTHROID) 50 MCG tablet Take 1 tablet (50 mcg total) by mouth daily before breakfast. 90 tablet 0  . metolazone (ZAROXOLYN) 5 MG tablet TAKE 1 TABLET DAILY AS NEEDED 90 tablet 2  . metoprolol succinate (TOPROL-XL) 50 MG 24 hr tablet TAKE 1 TABLET TWICE A DAY 180 tablet 0  . nitroGLYCERIN (NITROSTAT) 0.4 MG SL tablet Place 0.4 mg under the tongue every 5 (five) minutes as needed.      . nystatin-triamcinolone (MYCOLOG II) cream Apply 1 application topically 2 (two) times daily.     . Omega-3 Fatty Acids (FISH OIL) 1000 MG CAPS Take 1 capsule by mouth 3 (three) times daily.     . Potassium Chloride ER 20 MEQ TBCR Take 40 mEq by mouth 2 (two) times daily. 120 tablet 0  . predniSONE (DELTASONE) 10 MG tablet Take 1 tablet (10 mg total) by mouth daily with breakfast. 90 tablet 1  . PROAIR HFA 108 (90 Base) MCG/ACT inhaler USE 1 TO 2 INHALATIONS EVERY 4 HOURS AS NEEDED 25.5 g 4  . tamsulosin (FLOMAX) 0.4 MG CAPS capsule TAKE 1 CAPSULE DAILY 90 capsule 1   . tiotropium (SPIRIVA HANDIHALER) 18 MCG inhalation capsule Place 1 capsule (18 mcg total) into inhaler and inhale daily. 90 capsule 3  . torsemide (DEMADEX) 20 MG tablet Take 80 mg by mouth daily.      No current facility-administered medications for this visit.     Allergies  Allergen Reactions  . Contrast Media [Iodinated Diagnostic Agents] Shortness Of Breath  . Morphine And Related Other (See Comments)    Hallucinations   . Niacin And Related  Dermatitis    Family History  Problem Relation Age of Onset  . Heart disease Mother   . Heart disease Maternal Grandmother   . Diabetes Maternal Grandmother   . Cancer Neg Hx   . Stroke Neg Hx     Social History   Social History  . Marital status: Married    Spouse name: N/A  . Number of children: N/A  . Years of education: N/A   Occupational History  . Not on file.   Social History Main Topics  . Smoking status: Former Smoker    Packs/day: 2.00    Years: 40.00    Types: Cigarettes    Quit date: 05/24/1990  . Smokeless tobacco: Former Systems developer    Quit date: 05/24/1990  . Alcohol use No  . Drug use: No  . Sexual activity: Not Currently   Other Topics Concern  . Not on file   Social History Narrative  . No narrative on file    Hospitiliaztions: 10/2016, fall, sternal fracture  Health Maintenance:    Flu: 01/2016  Tetanus: 05/2013  Pneumovax: 04/2008  Prevnar: 06/2013  Zostavax: never  PSA: yearly with urology  Colon Screening: never  Eye Doctor: 08/2016  Dental Exam:   Providers:   PCP: Webb Silversmith, NP-C  Urologist: Dr. Erlene Quan  Cardiologist: Dr. Rockey Situ  Podiatry: Dr. Cleda Mccreedy  Pulmonologist: Dr. Mortimer Fries   I have personally reviewed and have noted:  1. The patient's medical and social history 2. Their use of alcohol, tobacco or illicit drugs 3. Their current medications and supplements 4. The patient's functional ability including ADL's, fall risks, home safety risks and hearing or visual  impairment. 5. Diet and physical activities 6. Evidence for depression or mood disorder  Subjective:   Review of Systems:   Constitutional: Pt reports fatigue. Denies fever, malaise, headache or abrupt weight changes.  HEENT: Pt reports decreased hearing. Denies eye pain, eye redness, ear pain, ringing in the ears, wax buildup, runny nose, nasal congestion, bloody nose, or sore throat. Respiratory: Pt reports shortness of breath. Denies difficulty breathing, cough or sputum production.   Cardiovascular: Pt reports swelling in his feet. Denies chest pain, chest tightness, palpitations or swelling in the hands.  Gastrointestinal: Denies abdominal pain, bloating, constipation, diarrhea or blood in the stool.  GU: Pt reports dribbling and nocturia. Denies urgency, frequency, pain with urination, burning sensation, blood in urine, odor or discharge. Musculoskeletal: Pt reports bilateral knee pain and difficulty with gait. Denies decrease in range of motion,  muscle pain or joint swelling.  Skin: Pt reports easy bruising. Denies redness, rashes, lesions or ulcercations.  Neurological: Pt reports difficulty with balance. Denies dizziness, difficulty with memory, difficulty with speech or problems with coordination.  Psych: Pt reports anxiety. Denies depression, SI/HI.  No other specific complaints in a complete review of systems (except as listed in HPI above).  Objective:  PE:   BP 112/64   Pulse (!) 58   Temp 97.9 F (36.6 C) (Oral)   Ht 5\' 6"  (1.676 m)   Wt 183 lb 8 oz (83.2 kg)   SpO2 98%   BMI 29.62 kg/m   Wt Readings from Last 3 Encounters:  10/18/16 188 lb 8 oz (85.5 kg)  10/10/16 183 lb (83 kg)  09/13/16 184 lb 12 oz (83.8 kg)    General: Appears his stated age, chronically ill appearing, in NAD. Skin: Thin. HEENT:  Ears: Tm's gray and intact, normal light reflex; Throat/Mouth: Teeth present, mucosa pink and moist, no  exudate, lesions or ulcerations noted.  Neck: Neck  supple, trachea midline. No masses, lumps present.  Cardiovascular: Normal rate and rhythm. S1,S2 noted.  No murmur, rubs or gallops noted. 1+ pitting BLE edema. No carotid bruits noted. Pulmonary/Chest: Normal effort and positive vesicular breath sounds. No respiratory distress. No wheezes, rales or ronchi noted.  Abdomen: Soft and nontender. Normal bowel sounds.  Musculoskeletal: Strength 5/5 BUE/BLE. Gait slow but steady with use of the cane. Neurological: Alert and oriented.  Psychiatric: Mood and affect normal. Behavior is normal. Judgment and thought content normal.     BMET    Component Value Date/Time   NA 137 10/18/2016 1417   NA 143 02/21/2015 1108   NA 137 08/05/2014 1839   K 3.8 10/18/2016 1417   K 3.6 08/05/2014 1839   CL 95 (L) 10/18/2016 1417   CL 106 08/05/2014 1839   CO2 35 (H) 10/18/2016 1417   CO2 23 08/05/2014 1839   GLUCOSE 158 (H) 10/18/2016 1417   GLUCOSE 235 (H) 08/05/2014 1839   BUN 48 (H) 10/18/2016 1417   BUN 26 02/21/2015 1108   BUN 19 08/05/2014 1839   CREATININE 1.71 (H) 10/18/2016 1417   CREATININE 1.20 08/05/2014 1839   CALCIUM 10.4 10/18/2016 1417   CALCIUM 8.6 (L) 08/05/2014 1839   GFRNONAA 46 (L) 10/11/2016 0458   GFRNONAA 57 (L) 08/05/2014 1839   GFRAA 54 (L) 10/11/2016 0458   GFRAA >60 08/05/2014 1839    Lipid Panel     Component Value Date/Time   CHOL 120 08/05/2015 1135   TRIG 208.0 (H) 08/05/2015 1135   HDL 32.10 (L) 08/05/2015 1135   CHOLHDL 4 08/05/2015 1135   VLDL 41.6 (H) 08/05/2015 1135   LDLCALC 47 10/04/2014 0940    CBC    Component Value Date/Time   WBC 16.2 (H) 10/11/2016 0458   RBC 5.26 10/11/2016 0458   HGB 15.1 10/11/2016 0458   HGB 13.3 08/05/2014 1839   HCT 43.6 10/11/2016 0458   HCT 40.1 08/05/2014 1839   PLT 243 10/11/2016 0458   PLT 246 08/05/2014 1839   MCV 82.9 10/11/2016 0458   MCV 83 08/05/2014 1839   MCH 28.7 10/11/2016 0458   MCHC 34.6 10/11/2016 0458   RDW 17.0 (H) 10/11/2016 0458   RDW  16.3 (H) 08/05/2014 1839   LYMPHSABS 3.3 05/20/2016 1041   LYMPHSABS 2.3 08/05/2014 1839   MONOABS 1.7 (H) 05/20/2016 1041   MONOABS 2.0 (H) 08/05/2014 1839   EOSABS 0.2 05/20/2016 1041   EOSABS 0.1 08/05/2014 1839   BASOSABS 0.1 05/20/2016 1041   BASOSABS 0.1 08/05/2014 1839    Hgb A1C Lab Results  Component Value Date   HGBA1C 7.5 (H) 10/10/2016      Assessment and Plan:   Medicare Annual Wellness Visit:  Diet: He does eat meat. He consumes fruits and veggies daily. He tries to avoid fried foods. He drinks mostly water and diet soda. Physical activity: None Depression/mood screen: Negative Hearing: Intact to whispered voice but decreased in large crowds Visual acuity: Grossly normal, performs annual eye exam  ADLs: Needs assist with drying after getting out of tub. Fall risk: High Home safety: Good Cognitive evaluation: Intact to orientation, naming, recall and repetition EOL planning: Adv directives, full code/ I agree  Preventative Medicine: Encouraged him to get a flu shot in the fall. Tetanus, pneumovax and prevnar UTD. He declines shingles vaccine. PSA checked by urology. He declines colon cancer screening. Encouraged him to consume a balanced diet,  try to stay active. Advised him to see an eye doctor and dentist annually. Will check CMET and Lipid profile today.   Next appointment: 6 months, follow up chronic conditions   Lawana Hartzell, NP

## 2016-11-22 NOTE — Assessment & Plan Note (Signed)
CMET and Lipid profile today Encouraged him to consume a low fat diet  Continue Lipitor for now. 

## 2016-11-22 NOTE — Assessment & Plan Note (Signed)
Recent TSH normal Continue current dose of Synthroid Will monitor

## 2016-11-22 NOTE — Assessment & Plan Note (Signed)
Controlled on Metoprolol, Torsemide, and Metolazone CMET today

## 2016-11-22 NOTE — Assessment & Plan Note (Signed)
On daily Prednisone He declines ortho referral

## 2016-11-22 NOTE — Assessment & Plan Note (Signed)
Continue Metoprolol and Eliquis

## 2016-11-22 NOTE — Assessment & Plan Note (Signed)
Some LE edema Continue daily weights, Torsemide, Metolazone and Metoprolol CMET todat Potassium sent to mail order today

## 2016-11-22 NOTE — Assessment & Plan Note (Signed)
Continue Symbicort and Spiriva He will continue to follow with Dr. Mortimer Fries

## 2016-11-22 NOTE — Assessment & Plan Note (Signed)
Continue Finasteride and Flomax He will continue to follow with urology

## 2016-11-22 NOTE — Assessment & Plan Note (Signed)
Chronic but stable on Celexa and Xanax Will monitor

## 2016-11-22 NOTE — Patient Instructions (Signed)

## 2016-11-22 NOTE — Assessment & Plan Note (Signed)
Controlled with Xanax before bed

## 2016-11-22 NOTE — Assessment & Plan Note (Signed)
Regular today Controlled on Metoprolol and Eliquis He will continue to follow with cardiology

## 2016-11-22 NOTE — Assessment & Plan Note (Signed)
No angina CMET and Lipid profile today Continue Lipitor, Metoprolol and Eliquis

## 2016-11-22 NOTE — Assessment & Plan Note (Signed)
A1C done 10/2016 Encouraged him to consume a low carb, low fat diet Foot exam today Encouraged yearly eye exams Continue Levemir, Humalog, Glipizide, Januvia and Neurontin Immunizations are UTD

## 2016-12-07 ENCOUNTER — Encounter: Payer: Self-pay | Admitting: Pulmonary Disease

## 2016-12-07 ENCOUNTER — Ambulatory Visit (INDEPENDENT_AMBULATORY_CARE_PROVIDER_SITE_OTHER): Payer: Medicare Other | Admitting: Pulmonary Disease

## 2016-12-07 VITALS — BP 108/80 | HR 64 | Resp 16 | Ht 66.0 in | Wt 188.0 lb

## 2016-12-07 DIAGNOSIS — J9610 Chronic respiratory failure, unspecified whether with hypoxia or hypercapnia: Secondary | ICD-10-CM | POA: Diagnosis not present

## 2016-12-07 NOTE — Patient Instructions (Signed)
Continue Symbicort and Spiriva Continue albuterol as needed No changes made in your medical therapies today Follow-up as needed or as deemed necessary by Dr. Edilia Bo

## 2016-12-09 NOTE — Progress Notes (Signed)
PULMONARY OFFICE FOLLOW UP NOTE  Date of initial consultation: 03/10/15 Reason for consultation: COPD Initial HPI: 11 M diagnosed with COPD approx 2004. Previously followed by Dr Raul Del. Wishes to establish care here. At his baseline, he is dyspneic with minimal exertion limited to ambulating no more than 50 meters and less than one flight of stairs. He has little day to day variation. He produces modest amount of white mucus on most days. He has never had hemoptysis. He has been maintained on Symbicort and Spiriva with PRN albuterol which he uses 1-2 times per day on average. He is also on prednisone 5 mg daily which he believes has been very beneficial. His records from Dr Gust Brooms office also indicate a diagnosis of pulmonary fibrosis but his CXR does not seem to demonstrate this and a recent CT abd/pelvis (06/03/14) reveals no fibrosis in the visualized portion of the lung bases. That CT does demonstrate a nodule in the LLL which has not been re-imaged. He also has OSA diagnosed several years ago for which he is on CPAP. He tolerates this well. Lastly, he has chronic AF and is followed by cardiology for this. Initial IMP/PLAN: COPD, Obesity, Restrictive physiology - due to obesity, Severe dyspnea - multifactorial including COPD, CAF, obesity, deconditioning, Lung nodule on CTAP 06/03/14, OSA - well controlled on CPAP, DM2 - hyperglycemia on most recent BMET. PLAN: Cont Symbicort and Spiriva as controller regimen, Cont prednisone @ 5 mg daily for now, Cont PRN albuterol, CT chest to re-eval pulmonary nodule in LLL,   CT chest 04/18/15: Interstitial lung disease characterized by symmetric upper lobe predominant extensive fine nodularity, interlobular septal thickening and ground-glass attenuation. Findings have mildly to moderately progressed since 02/01/2006 chest CT. Stable dominant 8 mm left lower lobe pulmonary nodule, for which 10 month stability has been demonstrated. Stable mild cardiomegaly. Left  main and 3 vessel coronary atherosclerosis   SUBJ: No new complaints. Remains on Spiriva and Symbicort. Completely off prednisone. Denies CP, fever, purulent sputum, hemoptysis, LE edema and calf tenderness.   OBJ:  Vitals:   12/07/16 1143 12/07/16 1147  BP:  108/80  Pulse:  64  Resp: 16   SpO2:  92%  Weight: 85.3 kg (188 lb)   Height: 5\' 6"  (1.676 m)     EXAM:  Gen: NAD HEENT: WNL Lungs: Diminished BS, no wheezes Cardiovascular: IRIR, no M Abdomen: Obese, soft, NT +BS Ext: 1+ pitting pretibial edema      IMPRESSION:   Chronic dyspnea - Stable to improving Former smoker Mild interstitial lung disease, NOS Restrictive pulmonary physiology due to ILD and obesity LLL Lung nodule - stable over approx one year. No further F/U planned OSA - well controlled on CPAP  PLAN:  Continue Symbicort and Spiriva Continue albuterol as needed No changes made in medical therapies today Follow-up as needed or as deemed necessary by Dr. Warnell Bureau, MD PCCM service Mobile [phone] Pager [phone] 12/13/2016 9:57 PM   ADD: After the encounter, I realized that the patient had undergone a CT scan of the chest in July of this year. The previously noted left lower lobe nodule has increased in size. We will contact patient and discuss further management.  Merton Border, MD PCCM service Mobile [phone] Pager [phone] 12/14/2016 10:51 AM

## 2016-12-15 ENCOUNTER — Other Ambulatory Visit: Payer: Self-pay | Admitting: Internal Medicine

## 2016-12-15 DIAGNOSIS — R7989 Other specified abnormal findings of blood chemistry: Secondary | ICD-10-CM

## 2016-12-16 ENCOUNTER — Ambulatory Visit: Payer: Medicare Other | Admitting: Pulmonary Disease

## 2016-12-17 ENCOUNTER — Other Ambulatory Visit: Payer: Self-pay | Admitting: Cardiovascular Disease

## 2016-12-17 NOTE — Telephone Encounter (Signed)
Please review for refill. Thanks!  

## 2016-12-19 ENCOUNTER — Other Ambulatory Visit: Payer: Self-pay | Admitting: Internal Medicine

## 2016-12-20 ENCOUNTER — Ambulatory Visit: Payer: Medicare Other | Admitting: Cardiovascular Disease

## 2016-12-24 MED ORDER — LEVOTHYROXINE SODIUM 50 MCG PO TABS: 50.0000 ug | ORAL_TABLET | Freq: Every day | ORAL | 1 refills | Status: DC

## 2017-01-01 ENCOUNTER — Other Ambulatory Visit: Payer: Self-pay | Admitting: Internal Medicine

## 2017-01-03 ENCOUNTER — Ambulatory Visit: Payer: Medicare Other | Admitting: Cardiovascular Disease

## 2017-01-07 NOTE — Progress Notes (Signed)
Cardiology Office Note  Date:  01/10/2017   ID:  [name]., DOB 05-02-1931, MRN [id number]  PCP:  Jearld Fenton, NP   Chief Complaint  Patient presents with  . other    6 month follow up. Patient c/o SOB and swelling in ankles. Meds reviewed verbally with patient.     HPI:  [name] is a 83 year old gentleman with history of  diabetes type 2,poorly controlled coronary artery disease, status post myocardial infarction treated with TPA at [facility] in January 29, 1996.  chronic atrial fibrillation since 2006, on eliquis 7035 had a cva, uncertain if he was on anticoagulation at the time History off falls.  Seen by Dr. Raul Del, pulmonary.  seen by CHF clinic. Several admissions to the hospital dating back to December 2014 with urine output function, urinary tract infections, status post stent placement by Dr. Elenor Quinones. He presents today for evaluation of his acute on chronic diastolic CHF  In follow-up today he reports he is having chest pain Recently [place], "cracked sternum" Chest x-rays performed in the emergency room showing Nondisplaced fx of his sternum Hospital records reviewed with the patient in detail Slowly getting better, still painful to cough  Stress at home, wife with severe Parkinson's, scheduled to have deep brain stimulator placed  Weight stable 178 to 188, takes metolazone 40 twice a day takes metolazone half pill  for weight 188 pounds Watches his weight daily  Labs reviewed  Total chol 104, LDL 29, CR 1.54 HBA1C 7.5  Denies any significant shortness of breath  Hemoglobin A1c Recently down to 6.5 previously well above 9  EKG on today's visit showing atrial fibrillation with rate 72 bpm, old anterior MI , ST and T wave abnormality in leads 1 and aVL  Other past medical history history of strokes documented on MRI. History of fall, fell backwards when walking with his walker hospital 06/03/2014 with discharge on the 26th with urinary  tract infection requiring antibiotics.  hospital admission 05/02/2014 with acute stroke. He was off warfarin for circumcision in early January 2016. He did have Lovenox bridge MRI showed multiple embolic strokes, left parietal, occipital areas. He was seen by neurology. He reports having continued vision problems. INR was 2 and he was continued on his warfarin. He is doing home physical therapy. At the time of discharge he was started on diltiazem. He does have chronic lower extremity edema. He has not received the diltiazem yet and is waiting for mail-order.  carotid ultrasound showing bilateral mild plaque dated 05/03/2014 Echocardiogram 05/03/2014 showing normal LV systolic function, normal right ventricular systolic pressure MRI brain showing multiple punctate foci acute infarction left parietal, left occipital, left frontal lobes likely embolic, chronic right occipital infarct  EKG on today's visit shows age fibrillation with ventricular rate 75 bpm, old anterior MI, inferior MI INR 2 days ago was 1.6. INR today 1.8  hospital admission January 26 2014 with discharge October 20 with urosepsis, bladder outflow obstruction.cultures grew [facility] was called to place a Foley catheter. Initial creatinine 1.56, BUN 25.  Echocardiogram 01/25/2014 showing moderate pulmonary hypertension, normal ejection fraction Notes indicate he had antibiotics, diuresis through his hospital course with improvement of his renal function down to creatinine 1.26. Creatinine was checked 2 days ago and was up to 1.4 with BUN 29 He had a Foley catheter for 10 days which was removed  He fell in January 2015 after taking a nap, he fell into his dresser drawer. Significant trauma to his  left arm He fell possibly in April 2015the details are unavailable. Did not hurt himself [place] again recently while in the bathroom. Lost his footing, fell backwards, broke the toilet lid, large left flank  hematoma  Myoview in March 2007 showed an ejection fraction of 50% with an old scar at the apex and mild peri-infarct ischemia.  echocardiogram in January 2008 showed an ejection fraction at the lower limits of normal with moderate LVH, mild aortic root dilatation, and mild mitral regurgitation. There was biatrial enlargement.  echocardiogram January 2015 showing ejection fraction 60%, moderately dilated left atrium, mild MR and TR, moderate pulmonary hypertension with right ventricular systolic pressure 53  Previous carotid Dopplers in January of 2008 showed 0-39% stenosis bilaterally. Carotid ultrasound may 2009 showing mild bilateral plaque, no significant stenoses  PMH:   has a past medical history of Arthritis; CAD; Cancer ([place]); Chronic atrial fibrillation ([place]); Chronic diastolic CHF (congestive heart failure) (H[facility]); COPD (chronic obstructive pulmonary disease) ([place]); CVA (8527,7824); DM; Falls; GERD (gastroesophageal reflux disease); History of kidney stones; HYPERLIPIDEMIA; HYPERTENSION; Kidney stone; Neuropathy of both feet; and Poor balance.  PSH:    Past Surgical History:  Procedure Laterality Date  . BLADDER SURGERY     stent placement   . CARDIAC CATHETERIZATION  1997   [facility]  . CAROTID STENT INSERTION  1997  . CIRCUMCISION  2016  . CORONARY ANGIOPLASTY  1997   s/p stent placement x 2   . CYSTOSCOPY W/ URETERAL STENT REMOVAL Left 10/09/2014   Procedure: CYSTOSCOPY WITH STENT REMOVAL;  Surgeon: Hollice Espy, MD;  Location: [facility] ORS;  Service: Urology;  Laterality: Left;  . CYSTOSCOPY WITH STENT PLACEMENT Left 10/09/2014   Procedure: CYSTOSCOPY WITH STENT PLACEMENT;  Surgeon: Hollice Espy, MD;  Location: [facility] ORS;  Service: Urology;  Laterality: Left;  . KIDNEY SURGERY  05/2013   s/p stent placement   . stents ureters Bilateral   . TONSILLECTOMY AND ADENOIDECTOMY  1959  . URETEROSCOPY WITH HOLMIUM LASER LITHOTRIPSY Left 10/09/2014   Procedure: URETEROSCOPY WITH  HOLMIUM LASER LITHOTRIPSY;  Surgeon: Hollice Espy, MD;  Location: [facility] ORS;  Service: Urology;  Laterality: Left;    Current Outpatient Prescriptions  Medication Sig Dispense Refill  . acetaminophen (TYLENOL) 650 MG CR tablet Take 650 mg by mouth every 8 (eight) hours as needed.      Marland Kitchen albuterol (2.5 MG/3ML) 0.083% NEBU 3 mL, albuterol (5 MG/ML) 0.5% NEBU 0.5 mL Inhale 1 mg into the lungs.    . ALPRAZolam (XANAX) 0.25 MG tablet Take 1 tablet (0.25 mg total) by mouth at bedtime as needed for anxiety. 90 tablet 0  . atorvastatin (LIPITOR) 40 MG tablet TAKE 1 TABLET DAILY 90 tablet 3  . BD PEN NEEDLE NANO U/F 32G X 4 MM MISC USE THREE TIMES A DAY FOR INSULIN ADMINISTRATION 270 each 1  . budesonide-formoterol (SYMBICORT) 160-4.5 MCG/ACT inhaler Inhale 2 puffs into the lungs 2 (two) times daily. 3 Inhaler 3  . citalopram (CELEXA) 10 MG tablet TAKE 1 TABLET DAILY 90 tablet 1  . Diclofenac Sodium 1.5 % SOLN Place 2 mLs onto the skin 4 (four) times daily. 150 mL 5  . dorzolamide (TRUSOPT) 2 % ophthalmic solution Place 1 drop into both eyes 3 (three) times daily.     Marland Kitchen ELIQUIS 5 MG TABS tablet TAKE 1 TABLET TWICE A DAY 180 tablet 3  . ezetimibe (ZETIA) 10 MG tablet TAKE 1 TABLET DAILY 90 tablet 1  . finasteride (PROSCAR) 5 MG tablet TAKE 1  TABLET DAILY 90 tablet 4  . gabapentin (NEURONTIN) 100 MG capsule TAKE 1 CAPSULE THREE TIMES A DAY 270 capsule 1  . glipiZIDE (GLUCOTROL) 10 MG tablet TAKE 1 TABLET TWICE A DAY BEFORE MEALS 180 tablet 1  . HUMALOG KWIKPEN 100 UNIT/ML KiwkPen INJECT 18 UNITS WITH BREAKFAST, 20 UNITS WITH LUNCH, AND 16 UNITS WITH DINNER 60 mL 0  . HYDROcodone-acetaminophen (NORCO/VICODIN) 5-325 MG tablet Take 1 tablet by mouth every 6 (six) hours as needed for severe pain. 30 tablet 0  . Insulin Detemir (LEVEMIR FLEXPEN) 100 UNIT/ML Pen Inject 42 Units into the skin daily at 10 pm. 15 pen 1  . Insulin Syringe-Needle U-100 30G X 1/2" 1 ML MISC 1 each by Does not apply route 3 (three)  times daily. 200 each 5  . JANUVIA 100 MG tablet TAKE 1 TABLET DAILY 90 tablet 1  . latanoprost (XALATAN) 0.005 % ophthalmic solution Place 1 drop into both eyes at bedtime.     Marland Kitchen levothyroxine (SYNTHROID, LEVOTHROID) 50 MCG tablet Take 1 tablet (50 mcg total) by mouth daily before breakfast. 90 tablet 1  . metolazone (ZAROXOLYN) 5 MG tablet TAKE 1 TABLET DAILY AS NEEDED 90 tablet 2  . metoprolol succinate (TOPROL-XL) 50 MG 24 hr tablet TAKE 1 TABLET TWICE A DAY 180 tablet 0  . nitroGLYCERIN (NITROSTAT) 0.4 MG SL tablet Place 0.4 mg under the tongue every 5 (five) minutes as needed.      . nystatin-triamcinolone (MYCOLOG II) cream Apply 1 application topically 2 (two) times daily.     . Omega-3 Fatty Acids (FISH OIL) 1000 MG CAPS Take 1 capsule by mouth 3 (three) times daily.     . Potassium Chloride ER 20 MEQ TBCR Take 40 mEq by mouth 2 (two) times daily. 120 tablet 0  . predniSONE (DELTASONE) 10 MG tablet Take 1 tablet (10 mg total) by mouth daily with breakfast. 90 tablet 1  . PROAIR HFA 108 (90 Base) MCG/ACT inhaler USE 1 TO 2 INHALATIONS EVERY 4 HOURS AS NEEDED 25.5 g 4  . tamsulosin (FLOMAX) 0.4 MG CAPS capsule TAKE 1 CAPSULE DAILY 90 capsule 1  . tiotropium (SPIRIVA HANDIHALER) 18 MCG inhalation capsule Place 1 capsule (18 mcg total) into inhaler and inhale daily. 90 capsule 3  . torsemide (DEMADEX) 20 MG tablet TAKE 2 TABLETS TWICE A DAY 360 tablet 3   No current facility-administered medications for this visit.      Allergies:   Contrast media [iodinated diagnostic agents]; Morphine and related; and Niacin and related   Social History:  The patient  reports that he quit smoking about 26 years ago. His smoking use included Cigarettes. He has a 80.00 pack-year smoking history. He quit smokeless tobacco use about 26 years ago. He reports that he does not drink alcohol or use drugs.   Family History:   family history includes Diabetes in his maternal grandmother; Heart disease in his  maternal grandmother and mother.    Review of Systems: Review of Systems  Constitutional: Negative.   Respiratory: Positive for shortness of breath.   Cardiovascular: Negative.   Gastrointestinal: Negative.   Musculoskeletal: Negative.        Unsteady gait  Neurological: Negative.   Psychiatric/Behavioral: Negative.   All other systems reviewed and are negative.    PHYSICAL EXAM: VS:  BP 103/62 (BP Location: Left Arm, Patient Position: Sitting, Cuff Size: Normal)   Pulse 72   Ht 5\' 6"  (1.676 m)   Wt 187 lb 12 oz (  85.2 kg)   BMI 30.30 kg/m  , BMI Body mass index is 30.3 kg/m. GEN: Well nourished, well developed, in no acute distress  HEENT: normal  Neck: no JVD, carotid bruits, or masses Cardiac: RRR; no murmurs, rubs, or gallops,Trace lower extremity edema  Respiratory:  clear to auscultation bilaterally, normal work of breathing GI: soft, nontender, nondistended, + BS MS: no deformity or atrophy  Skin: warm and dry, no rash Neuro:  Strength and sensation are intact Psych: euthymic mood, full affect    Recent Labs: 10/10/2016: Magnesium 2.2 10/11/2016: Hemoglobin 15.1; Platelets 243 11/02/2016: TSH 3.85 11/22/2016: ALT 14; BUN 34; Creatinine, Ser 1.54; Potassium 3.5; Sodium 138    Lipid Panel Lab Results  Component Value Date   CHOL 104 11/22/2016   HDL 43.80 11/22/2016   LDLCALC 29 11/22/2016   TRIG 154.0 (H) 11/22/2016      Wt Readings from Last 3 Encounters:  01/10/17 187 lb 12 oz (85.2 kg)  12/07/16 188 lb (85.3 kg)  11/22/16 183 lb 8 oz (83.2 kg)       ASSESSMENT AND PLAN:  Mixed hyperlipidemia - Plan: EKG 12-Lead Cholesterol is at goal on the current lipid regimen. No changes to the medications were made.  Essential hypertension - Plan: EKG 12-Lead Blood pressure is well controlled on today's visit. No changes made to the medications.  Coronary artery disease involving native coronary artery of native heart without angina pectoris - Plan: EKG  12-Lead Denies any symptoms concerning for angina, no further testing needed  Chronic atrial fibrillation ([place]) - Plan: EKG 12-Lead Rate relatively well-controlled  Tolerating anticoagulation, eliquis  No medication changes made  Cerebrovascular accident (CVA) due to embolism of middle cerebral artery, unspecified blood vessel laterality ([place]) - Plan: EKG 12-Lead On anticoagulation, no recent events  Chronic diastolic CHF (congestive heart failure) ([place]) - Plan: EKG 12-Lead Long discussion concerning his CHF symptoms Seems to be stable with mildly fluctuating weights  torsemide 40 milligrams twice a day with half dose metolazone for weight 188 pounds. This seems to work for him   Controlled type 2 diabetes mellitus with other circulatory complication, with long-term current use of insulin ([place]) - Plan: EKG 12-Lead Hemoglobin A1c stable, improved over the past several years   Centrilobular emphysema ([place]) - Plan: EKG 12-Lead With aggressive diuresis, shortness of breath has improved Currently not on oxygen.   Total encounter time more than 25 minutes  Greater than 50% was spent in counseling and coordination of care with the patient   Disposition:   F/U  12 months   No orders of the defined types were placed in this encounter.    Signed, Esmond Plants, M.D., Ph.D. 01/10/2017  [place], [place]

## 2017-01-08 ENCOUNTER — Other Ambulatory Visit: Payer: Self-pay | Admitting: Internal Medicine

## 2017-01-10 ENCOUNTER — Encounter: Payer: Self-pay | Admitting: Cardiovascular Disease

## 2017-01-10 ENCOUNTER — Ambulatory Visit (INDEPENDENT_AMBULATORY_CARE_PROVIDER_SITE_OTHER): Payer: Medicare Other | Admitting: Cardiovascular Disease

## 2017-01-10 VITALS — BP 103/62 | HR 72 | Ht 66.0 in | Wt 187.8 lb

## 2017-01-10 DIAGNOSIS — I25118 Atherosclerotic heart disease of native coronary artery with other forms of angina pectoris: Secondary | ICD-10-CM

## 2017-01-10 DIAGNOSIS — J432 Centrilobular emphysema: Secondary | ICD-10-CM | POA: Diagnosis not present

## 2017-01-10 DIAGNOSIS — I5032 Chronic diastolic (congestive) heart failure: Secondary | ICD-10-CM | POA: Diagnosis not present

## 2017-01-10 DIAGNOSIS — I482 Chronic atrial fibrillation, unspecified: Secondary | ICD-10-CM

## 2017-01-10 DIAGNOSIS — I10 Essential (primary) hypertension: Secondary | ICD-10-CM | POA: Diagnosis not present

## 2017-01-10 DIAGNOSIS — E782 Mixed hyperlipidemia: Secondary | ICD-10-CM

## 2017-01-10 NOTE — Patient Instructions (Addendum)
Medication Instructions:   No medication changes made  Stop the fish oil  Labwork:  No new labs needed  Testing/Procedures:  No further testing at this time   Follow-Up: It was a pleasure seeing you in the office today. Please call us if you have new issues that need to be addressed before your next appt.  [phone]  Your physician wants you to follow-up in: 12 months.  You will receive a reminder letter in the mail two months in advance. If you don't receive a letter, please call our office to schedule the follow-up appointment.  If you need a refill on your cardiac medications before your next appointment, please call your pharmacy.

## 2017-01-14 ENCOUNTER — Other Ambulatory Visit: Payer: Self-pay | Admitting: Internal Medicine

## 2017-01-18 ENCOUNTER — Other Ambulatory Visit: Payer: Self-pay | Admitting: *Deleted

## 2017-01-18 MED ORDER — EZETIMIBE 10 MG PO TABS: 10.0000 mg | ORAL_TABLET | Freq: Every day | ORAL | 1 refills | Status: DC

## 2017-01-18 NOTE — Telephone Encounter (Signed)
Received refill request from pharmacy for Ezetimibe. Refill sent to [facility] per the request.

## 2017-01-21 ENCOUNTER — Ambulatory Visit (INDEPENDENT_AMBULATORY_CARE_PROVIDER_SITE_OTHER): Payer: Medicare Other | Admitting: Pulmonary Disease

## 2017-01-21 ENCOUNTER — Encounter: Payer: Self-pay | Admitting: Pulmonary Disease

## 2017-01-21 VITALS — BP 114/64 | HR 81 | Ht 66.0 in | Wt 190.0 lb

## 2017-01-21 DIAGNOSIS — R0609 Other forms of dyspnea: Secondary | ICD-10-CM | POA: Diagnosis not present

## 2017-01-21 DIAGNOSIS — R911 Solitary pulmonary nodule: Secondary | ICD-10-CM

## 2017-01-21 DIAGNOSIS — J849 Interstitial pulmonary disease, unspecified: Secondary | ICD-10-CM

## 2017-01-21 DIAGNOSIS — J449 Chronic obstructive pulmonary disease, unspecified: Secondary | ICD-10-CM

## 2017-01-21 DIAGNOSIS — Z87891 Personal history of nicotine dependence: Secondary | ICD-10-CM

## 2017-01-21 NOTE — Progress Notes (Signed)
PULMONARY OFFICE FOLLOW UP NOTE  Date of initial consultation: 03/10/15  Reason for consultation: COPD Initial HPI: 33 M diagnosed with COPD since approx 2004. Previously followed by Dr Raul Del. At baseline, he is dyspneic with minimal exertion limited to ambulating no more than 50 meters and less than one flight of stairs. He has little day to day variation. He produces modest amount of white mucus on most days. He has been maintained on Symbicort and Spiriva with PRN albuterol which he uses 1-2 times per day on average. He is also on prednisone 5 mg daily which he believes has been very beneficial. His records from Dr Gust Brooms office also indicate a diagnosis of pulmonary fibrosis but his CXR does not seem to demonstrate this and a recent CT abd/pelvis (06/03/14) reveals no fibrosis in the visualized portion of the lung bases. That CT does demonstrate a nodule in the LLL which has not been re-imaged. He also has OSA diagnosed several years ago for which he is on CPAP. He tolerates this well. Lastly, he has chronic AF and is followed by cardiology for this. Initial IMP/PLAN: COPD, Obesity, Restrictive physiology - due to obesity, Severe dyspnea - multifactorial including COPD, CAF, obesity, deconditioning, Lung nodule on CTAP 06/03/14, OSA - well controlled on CPAP, DM2 - hyperglycemia on most recent BMET. PLAN: Cont Symbicort and Spiriva as controller regimen, Cont prednisone @ 5 mg daily for now, Cont PRN albuterol, CT chest to re-eval pulmonary nodule in LLL   DATA: CT chest 04/18/15: Interstitial lung disease characterized by symmetric upper lobe predominant extensive fine nodularity, interlobular septal thickening and ground-glass attenuation. Findings have mildly to moderately progressed since 02/01/2006 chest CT. Stable dominant 8 mm left lower lobe pulmonary nodule, for which 10 month stability has been demonstrated. Stable mild cardiomegaly. Left main and 3 vessel coronary atherosclerosis  CT  chest 10/10/16: Stable changes of interstitial lung disease along with underlying emphysema. There is a rounded slightly spiculated 2.1 cm lesion in the left lower lobe. This measured 8 mm on the prior study and is worrisome for neoplasm. Other small scattered pulmonary nodules appears stable and may be related to the interstitial lung disease  SUBJ: Return today to discuss finding of left lower lobe nodule. No new pulmonary or respiratory complaints. Remains on Spiriva and Symbicort. He is back on prednisone, 10 mg per day, for left knee arthritis. Denies CP, fever, purulent sputum, hemoptysis, LE edema and calf tenderness.   OBJ:  Vitals:   01/21/17 1048 01/21/17 1050  BP:  114/64  Pulse:  81  SpO2:  96%  Weight: 86.2 kg (190 lb)   Height: 5\' 6"  (1.676 m)   Room air  EXAM:  Gen: NAD HEENT: WNL, no cervical lymphadenopathy Lungs: Diminished BS, left basilar crackles, no wheezes Cardiovascular: IRIR, rate controlled, no M Abdomen: Obese, soft, NT +BS Ext: 2+ pitting pretibial edema  BMP Latest Ref Rng & Units 11/22/2016 10/18/2016 10/11/2016  Glucose 70 - 99 mg/dL 212(H) 158(H) 136(H)  BUN 6 - 23 mg/dL 34(H) 48(H) 42(H)  Creatinine 0.40 - 1.50 mg/dL 1.54(H) 1.71(H) 1.37(H)  BUN/Creat Ratio 10 - 22 - - -  Sodium 135 - 145 mEq/L 138 137 140  Potassium 3.5 - 5.1 mEq/L 3.5 3.8 3.1(L)  Chloride 96 - 112 mEq/L 95(L) 95(L) 101  CO2 19 - 32 mEq/L 34(H) 35(H) 31  Calcium 8.4 - 10.5 mg/dL 9.3 10.4 8.6(L)   CBC Latest Ref Rng & Units 10/11/2016 10/10/2016 08/17/2016  WBC 3.8 - 10.6  K/uL 16.2(H) 18.1(H) 15.5(H)  Hemoglobin 13.0 - 18.0 g/dL 15.1 15.5 14.7  Hematocrit 40.0 - 52.0 % 43.6 45.0 43.2  Platelets 150 - 440 K/uL 243 272 242.0    No new CXR  CT chest as noted above   IMPRESSION:   Former smoker COPD - no PFTs available but suspect at least moderate to severe Mild interstitial lung disease, NOS Restrictive pulmonary physiology due to ILD and obesity Severe baseline DOE OSA - well  controlled on CPAP LLL Lung nodule - this has been present for a long time, dating at least back to 04/2013 on CTAP. However, it has demonstrated significant growth on the most recent CT scan performed in July of this year. This is worrisome for malignancy.  DISCUSSION: We discussed in detail the finding of increasing size of left lower lobe lung nodule and my concern that this represents malignancy. We discussed potential diagnostic and therapeutic options. His wife is presently suffering significant medical problems and he wishes to postpone diagnostic evaluation until she recovers from her current issues.   PLAN:  Continue Symbicort and Spiriva Continue albuterol as needed PET scan is planned - He is to contact us regarding timing of this study  If positive (as anticipated), we will arrange for a CT-guided biopsy and referral to oncology.  He is not likely a candidate for resection due to comorbidities Follow-up in 3 months   Merton Border, MD PCCM service Mobile [phone] Pager [phone] 01/21/2017 11:10 AM

## 2017-01-21 NOTE — Patient Instructions (Signed)
Continue Symbicort, Spiriva and albuterol as needed  We discussed the planned for a PET scan to evaluate the left lung nodule - you are to call regarding the timing of this study area and I would like to get it done at least by mid November.  After the PET scan has been performed, I will contact you with results and the next step of evaluation and management  Follow-up office appointment in 3 months

## 2017-01-24 ENCOUNTER — Other Ambulatory Visit: Payer: Self-pay | Admitting: Internal Medicine

## 2017-01-24 ENCOUNTER — Encounter: Payer: Self-pay | Admitting: Internal Medicine

## 2017-01-24 MED ORDER — SITAGLIPTIN PHOSPHATE 100 MG PO TABS: 100.0000 mg | ORAL_TABLET | Freq: Every day | ORAL | 1 refills | Status: DC

## 2017-01-25 ENCOUNTER — Encounter: Payer: Self-pay | Admitting: Internal Medicine

## 2017-02-01 ENCOUNTER — Telehealth: Payer: Self-pay

## 2017-02-01 DIAGNOSIS — F419 Anxiety disorder, unspecified: Secondary | ICD-10-CM

## 2017-02-01 MED ORDER — ALPRAZOLAM 0.25 MG PO TABS: 0.2500 mg | ORAL_TABLET | Freq: Every evening | ORAL | 0 refills | Status: DC | PRN

## 2017-02-01 NOTE — Telephone Encounter (Signed)
Ok to phone in Xanax 

## 2017-02-01 NOTE — Addendum Note (Signed)
Addended by: Lurlean Nanny on: 02/01/2017 04:02 PM   Modules accepted: Orders

## 2017-02-01 NOTE — Telephone Encounter (Signed)
Rx faxed to mail order

## 2017-02-01 NOTE — Telephone Encounter (Signed)
Requesting: Alprazolam ([facility]) Contract: Yes UDS: 9.04.2015 Low-risk next screen 3.04.2016 Last OV: 8.13.2018 Next OV: Not scheduled Last Refill: 7.12.2018 #[id number]    Please advise

## 2017-02-02 ENCOUNTER — Other Ambulatory Visit: Payer: Self-pay | Admitting: Internal Medicine

## 2017-02-07 ENCOUNTER — Other Ambulatory Visit: Payer: Self-pay | Admitting: Family

## 2017-02-07 ENCOUNTER — Ambulatory Visit: Payer: Self-pay | Admitting: *Deleted

## 2017-02-07 NOTE — Telephone Encounter (Signed)
Patient reports several low glucose readings-  10/28-   7:30am no reading- felt bad  10:30pm 56 10/26 3:00 am 60 woke up patient was given orange juice and peanut butter/ jelly 70/100 checking after eating  Patient has not adjusted dosing until last night- tried night levemir from 42 to 21 and that did not help. Today level is 120 - has had a small chocolate. Next appointment is 02/15/2017 .   Patient is eating regular- may not be sticking to diet as well as should be.( Wife has been ill lately) Medications are reviewed and correct. Patient does have transportation tomorrow if needs to be seen at office.  [phone]  Wife Asiel Chrostowski  Patient and wife's main concern is what to do about the low readings. They have not been keeping records of reading- they did question how often they should be recording the glucose levels- it does seem that the low readings are occurring during the night.   Reason for Disposition . Caller has NON-URGENT medication question about med that PCP prescribed and triager unable to answer question  Protocols used: DIABETES - LOW BLOOD SUGAR-A-AH

## 2017-02-14 ENCOUNTER — Encounter: Payer: Self-pay | Admitting: Internal Medicine

## 2017-02-14 ENCOUNTER — Encounter: Payer: Self-pay | Admitting: Pulmonary Disease

## 2017-02-14 ENCOUNTER — Other Ambulatory Visit: Payer: Self-pay | Admitting: Pulmonary Disease

## 2017-02-14 ENCOUNTER — Ambulatory Visit: Payer: Medicare Other | Admitting: Internal Medicine

## 2017-02-14 MED ORDER — TIOTROPIUM BROMIDE MONOHYDRATE 18 MCG IN CAPS: 1.0000 | ORAL_CAPSULE | Freq: Every day | RESPIRATORY_TRACT | 3 refills | Status: DC

## 2017-02-14 MED ORDER — INSULIN PEN NEEDLE 32G X 4 MM MISC: 1 refills | Status: DC

## 2017-02-14 NOTE — Telephone Encounter (Signed)
Per patient request Spiriva has been refilled thru [facility].

## 2017-02-15 ENCOUNTER — Encounter: Payer: Self-pay | Admitting: Internal Medicine

## 2017-02-15 ENCOUNTER — Ambulatory Visit (INDEPENDENT_AMBULATORY_CARE_PROVIDER_SITE_OTHER): Payer: Medicare Other | Admitting: Internal Medicine

## 2017-02-15 VITALS — BP 108/68 | HR 61 | Temp 98.1°F | Wt 187.5 lb

## 2017-02-15 DIAGNOSIS — Z23 Encounter for immunization: Secondary | ICD-10-CM

## 2017-02-15 DIAGNOSIS — Z794 Long term (current) use of insulin: Secondary | ICD-10-CM

## 2017-02-15 DIAGNOSIS — E039 Hypothyroidism, unspecified: Secondary | ICD-10-CM | POA: Diagnosis not present

## 2017-02-15 DIAGNOSIS — E11649 Type 2 diabetes mellitus with hypoglycemia without coma: Secondary | ICD-10-CM | POA: Diagnosis not present

## 2017-02-15 LAB — HEMOGLOBIN A1C: HEMOGLOBIN A1C: 6.7 % — AB (ref 4.6–6.5)

## 2017-02-15 LAB — T4, FREE: FREE T4: 1.01 ng/dL (ref 0.60–1.60)

## 2017-02-15 LAB — TSH: TSH: 3.92 u[IU]/mL (ref 0.35–4.50)

## 2017-02-15 NOTE — Patient Instructions (Signed)
Hypoglycemia Hypoglycemia is when the sugar (glucose) level in the blood is too low. Symptoms of low blood sugar may include:  Feeling: ? Hungry. ? Worried or nervous (anxious). ? Sweaty and clammy. ? Confused. ? Dizzy. ? Sleepy. ? Sick to your stomach (nauseous).  Having: ? A fast heartbeat. ? A headache. ? A change in your vision. ? Jerky movements that you cannot control (seizure). ? Nightmares. ? Tingling or no feeling (numbness) around the mouth, lips, or tongue.  Having trouble with: ? Talking. ? Paying attention (concentrating). ? Moving (coordination). ? Sleeping.  Shaking.  Passing out (fainting).  Getting upset easily (irritability).  Low blood sugar can happen to people who have diabetes and people who do not have diabetes. Low blood sugar can happen quickly, and it can be an emergency. Treating Low Blood Sugar Low blood sugar is often treated by eating or drinking something sugary right away. If you can think clearly and swallow safely, follow the 15:15 rule:  Take 15 grams of a fast-acting carb (carbohydrate). Some fast-acting carbs are: ? 1 tube of glucose gel. ? 3 sugar tablets (glucose pills). ? 6-8 pieces of hard candy. ? 4 oz (120 mL) of fruit juice. ? 4 oz (120 mL) of regular (not diet) soda.  Check your blood sugar 15 minutes after you take the carb.  If your blood sugar is still at or below 70 mg/dL (3.9 mmol/L), take 15 grams of a carb again.  If your blood sugar does not go above 70 mg/dL (3.9 mmol/L) after 3 tries, get help right away.  After your blood sugar goes back to normal, eat a meal or a snack within 1 hour.  Treating Very Low Blood Sugar If your blood sugar is at or below 54 mg/dL (3 mmol/L), you have very low blood sugar (severe hypoglycemia). This is an emergency. Do not wait to see if the symptoms will go away. Get medical help right away. Call your local emergency services (911 in the [place] Do not drive yourself to the  hospital. If you have very low blood sugar and you cannot eat or drink, you may need a glucagon shot (injection). A family member or friend should learn how to check your blood sugar and how to give you a glucagon shot. Ask your doctor if you need to have a glucagon shot kit at home. Follow these instructions at home: General instructions  Avoid any diets that cause you to not eat enough food. Talk with your doctor before you start any new diet.  Take over-the-counter and prescription medicines only as told by your doctor.  Limit alcohol to no more than 1 drink per day for nonpregnant women and 2 drinks per day for men. One drink equals 12 oz of beer, 5 oz of wine, or 1 oz of hard liquor.  Keep all follow-up visits as told by your doctor. This is important. If You Have Diabetes:   Make sure you know the symptoms of low blood sugar.  Always keep a source of sugar with you, such as: ? Sugar. ? Sugar tablets. ? Glucose gel. ? Fruit juice. ? Regular soda (not diet soda). ? Milk. ? Hard candy. ? Honey.  Take your medicines as told.  Follow your exercise and meal plan. ? Eat on time. Do not skip meals. ? Follow your sick day plan when you cannot eat or drink normally. Make this plan ahead of time with your doctor.  Check your blood sugar as often  as told by your doctor. Always check before and after exercise.  Share your diabetes care plan with: ? Your work or school. ? People you live with.  Check your pee (urine) for ketones: ? When you are sick. ? As told by your doctor.  Carry a card or wear jewelry that says you have diabetes. If You Have Low Blood Sugar From Other Causes:   Check your blood sugar as often as told by your doctor.  Follow instructions from your doctor about what you cannot eat or drink. Contact a doctor if:  You have trouble keeping your blood sugar in your target range.  You have low blood sugar often. Get help right away if:  You still have  symptoms after you eat or drink something sugary.  Your blood sugar is at or below 54 mg/dL (3 mmol/L).  You have jerky movements that you cannot control.  You pass out. These symptoms may be an emergency. Do not wait to see if the symptoms will go away. Get medical help right away. Call your local emergency services (911 in the [place] Do not drive yourself to the hospital. This information is not intended to replace advice given to you by your health care provider. Make sure you discuss any questions you have with your health care provider. Document Released: 06/23/2009 Document Revised: 09/04/2015 Document Reviewed: 05/02/2015 Elsevier Interactive Patient Education  [employer].

## 2017-02-16 ENCOUNTER — Encounter: Payer: Self-pay | Admitting: Internal Medicine

## 2017-02-16 MED ORDER — GLIPIZIDE 5 MG PO TABS: 5.0000 mg | ORAL_TABLET | Freq: Two times a day (BID) | ORAL | 0 refills | Status: DC

## 2017-02-16 NOTE — Progress Notes (Signed)
Subjective:    Patient ID: [name]., male    DOB: 1932/03/29, 83 y.o.   MRN: [id number]  HPI  Pt presents to the clinic today with c/o hypoglycemia. His last A1C was 7.5%, 10/2016. He is taking Levemir, Humalog, Januvia and Glipizide. He reports his sugars have gotten down into the 60's at times. He has not passed out, but has felt jittery and a little confused. He has been cutting back on his Lantus, down to 21 units before bed. He denies recent changes in his diet and hasn't noticed much of a weight loss. He knows his medications need adjusting but he is not sure how to do this.  Review of Systems      Past Medical History:  Diagnosis Date  . Arthritis   . CAD    a. MI 01/29/1996 tx'd w/ TPA @ [facility]; b. Myoview 06/2005: EF 50%, scar @ apex, mild peri-infarct ischemia  . Cancer ([place])    skin  . Chronic atrial fibrillation ([place])    a. since 2006; b. on warfarin  . Chronic diastolic CHF (congestive heart failure) ([place])    a. echo 04/2006: EF lower limits of nl, mod LVH, mild aortic root dilatation, & mild MR, biatrial enlargement; b. echo 04/2013: EF 60%, mod dilated LA, mild MR & TR, mod pulm HTN w/ RV systolic pressure 53, c. echo 04/21/14: EF 55-60%, unable to exclude WMA, severely dilated LA 6.6 cm, nl RVSP, mildly dilated aortic root  . COPD (chronic obstructive pulmonary disease) ([place])   . CVA 9562,1308   x2  . DM   . Falls   . GERD (gastroesophageal reflux disease)   . History of kidney stones   . HYPERLIPIDEMIA   . HYPERTENSION   . Kidney stone    a. s/p left ureteral stenting 04/24/14  . Neuropathy of both feet   . Poor balance     Current Outpatient Medications  Medication Sig Dispense Refill  . acetaminophen (TYLENOL) 650 MG CR tablet Take 650 mg by mouth every 8 (eight) hours as needed.      Marland Kitchen albuterol (2.5 MG/3ML) 0.083% NEBU 3 mL, albuterol (5 MG/ML) 0.5% NEBU 0.5 mL Inhale 1 mg into the lungs.    . ALPRAZolam (XANAX) 0.25 MG tablet Take 1 tablet (0.25 mg  total) by mouth at bedtime as needed for anxiety. 90 tablet 0  . atorvastatin (LIPITOR) 40 MG tablet TAKE 1 TABLET DAILY 90 tablet 3  . budesonide-formoterol (SYMBICORT) 160-4.5 MCG/ACT inhaler Inhale 2 puffs into the lungs 2 (two) times daily. 3 Inhaler 3  . citalopram (CELEXA) 10 MG tablet TAKE 1 TABLET DAILY 90 tablet 1  . Diclofenac Sodium 1.5 % SOLN Place 2 mLs onto the skin 4 (four) times daily. 150 mL 5  . dorzolamide (TRUSOPT) 2 % ophthalmic solution Place 1 drop into both eyes 3 (three) times daily.     Marland Kitchen ELIQUIS 5 MG TABS tablet TAKE 1 TABLET TWICE A DAY 180 tablet 3  . ezetimibe (ZETIA) 10 MG tablet Take 1 tablet (10 mg total) by mouth daily. 90 tablet 1  . finasteride (PROSCAR) 5 MG tablet TAKE 1 TABLET DAILY 90 tablet 4  . gabapentin (NEURONTIN) 100 MG capsule TAKE 1 CAPSULE THREE TIMES A DAY 270 capsule 1  . HUMALOG KWIKPEN 100 UNIT/ML KiwkPen INJECT 18 UNITS WITH BREAKFAST, 20 UNITS WITH LUNCH, AND 16 UNITS WITH DINNER 60 mL 0  . HYDROcodone-acetaminophen (NORCO/VICODIN) 5-325 MG tablet Take 1 tablet by  mouth every 6 (six) hours as needed for severe pain. 30 tablet 0  . Insulin Detemir (LEVEMIR FLEXPEN) 100 UNIT/ML Pen Inject 42 Units into the skin daily at 10 pm. 15 pen 1  . Insulin Pen Needle (BD PEN NEEDLE NANO U/F) 32G X 4 MM MISC USE THREE TIMES A DAY FOR INSULIN ADMINISTRATION 270 each 1  . Insulin Syringe-Needle U-100 30G X 1/2" 1 ML MISC 1 each by Does not apply route 3 (three) times daily. 200 each 5  . latanoprost (XALATAN) 0.005 % ophthalmic solution Place 1 drop into both eyes at bedtime.     Marland Kitchen levothyroxine (SYNTHROID, LEVOTHROID) 50 MCG tablet Take 1 tablet (50 mcg total) by mouth daily before breakfast. 90 tablet 1  . metolazone (ZAROXOLYN) 5 MG tablet TAKE 1 TABLET DAILY AS NEEDED 90 tablet 2  . metoprolol succinate (TOPROL-XL) 50 MG 24 hr tablet TAKE 1 TABLET TWICE A DAY 180 tablet 1  . nitroGLYCERIN (NITROSTAT) 0.4 MG SL tablet Place 0.4 mg under the tongue every 5  (five) minutes as needed.      . nystatin-triamcinolone (MYCOLOG II) cream Apply 1 application topically 2 (two) times daily.     . Potassium Chloride ER 20 MEQ TBCR Take 40 mEq by mouth 2 (two) times daily. 120 tablet 0  . potassium chloride SA (K-DUR,KLOR-CON) 20 MEQ tablet TAKE 3 TABLETS DAILY 270 tablet 0  . predniSONE (DELTASONE) 10 MG tablet Take 1 tablet (10 mg total) by mouth daily with breakfast. 90 tablet 1  . PROAIR HFA 108 (90 Base) MCG/ACT inhaler USE 1 TO 2 INHALATIONS EVERY 4 HOURS AS NEEDED 25.5 g 4  . sitaGLIPtin (JANUVIA) 100 MG tablet Take 1 tablet (100 mg total) by mouth daily. 90 tablet 1  . tamsulosin (FLOMAX) 0.4 MG CAPS capsule TAKE 1 CAPSULE DAILY 90 capsule 1  . tiotropium (SPIRIVA HANDIHALER) 18 MCG inhalation capsule Place 1 capsule (18 mcg total) daily into inhaler and inhale. 90 capsule 3  . torsemide (DEMADEX) 20 MG tablet TAKE 2 TABLETS TWICE A DAY 360 tablet 3  . glipiZIDE (GLUCOTROL) 5 MG tablet Take 1 tablet (5 mg total) 2 (two) times daily before a meal by mouth. 180 tablet 0   No current facility-administered medications for this visit.     Allergies  Allergen Reactions  . Contrast Media [Iodinated Diagnostic Agents] Shortness Of Breath  . Morphine And Related Other (See Comments)    Hallucinations   . Niacin And Related Dermatitis    Family History  Problem Relation Age of Onset  . Heart disease Mother   . Heart disease Maternal Grandmother   . Diabetes Maternal Grandmother   . Cancer Neg Hx   . Stroke Neg Hx     Social History   Socioeconomic History  . Marital status: Married    Spouse name: Not on file  . Number of children: Not on file  . Years of education: Not on file  . Highest education level: Not on file  Social Needs  . Financial resource strain: Not on file  . Food insecurity - worry: Not on file  . Food insecurity - inability: Not on file  . Transportation needs - medical: Not on file  . Transportation needs -  non-medical: Not on file  Occupational History  . Not on file  Tobacco Use  . Smoking status: Former Smoker    Packs/day: 2.00    Years: 40.00    Pack years: 80.00    Types:  Cigarettes    Last attempt to quit: 05/24/1990    Years since quitting: 26.7  . Smokeless tobacco: Former Systems developer    Quit date: 05/24/1990  Substance and Sexual Activity  . Alcohol use: No  . Drug use: No  . Sexual activity: Not Currently  Other Topics Concern  . Not on file  Social History Narrative  . Not on file     Constitutional: Denies fever, malaise, fatigue, headache or abrupt weight changes.  Musculoskeletal: Pt reports intermittent weakness. Denies decrease in range of motion, difficulty with gait, muscle pain or joint pain and swelling.  Skin: Denies redness, rashes, lesions or ulcercations.  Neurological: Pt reports intermittent jitteriness. Denies dizziness, difficulty with memory, difficulty with speech or problems with balance and coordination.    No other specific complaints in a complete review of systems (except as listed in HPI above).  Objective:   Physical Exam   BP 108/68   Pulse 61   Temp 98.1 F (36.7 C) (Oral)   Wt 187 lb 8 oz (85 kg)   SpO2 95%   BMI 30.26 kg/m  Wt Readings from Last 3 Encounters:  02/15/17 187 lb 8 oz (85 kg)  01/21/17 190 lb (86.2 kg)  01/10/17 187 lb 12 oz (85.2 kg)    General: Appears his stated age, chronically ill appearing, in NAD. Skin: Warm, dry and intact. No ulcerations noted. Cardiovascular: Normal rate with irregular rhytym. Murmur noted. No BLE edema.  Pulmonary/Chest: Normal effort. No respiratory distress.  Neurological: Alert and oriented.   BMET    Component Value Date/Time   NA 138 11/22/2016 1019   NA 143 02/21/2015 1108   NA 137 08/05/2014 1839   K 3.5 11/22/2016 1019   K 3.6 08/05/2014 1839   CL 95 (L) 11/22/2016 1019   CL 106 08/05/2014 1839   CO2 34 (H) 11/22/2016 1019   CO2 23 08/05/2014 1839   GLUCOSE 212 (H)  11/22/2016 1019   GLUCOSE 235 (H) 08/05/2014 1839   BUN 34 (H) 11/22/2016 1019   BUN 26 02/21/2015 1108   BUN 19 08/05/2014 1839   CREATININE 1.54 (H) 11/22/2016 1019   CREATININE 1.20 08/05/2014 1839   CALCIUM 9.3 11/22/2016 1019   CALCIUM 8.6 (L) 08/05/2014 1839   GFRNONAA 46 (L) 10/11/2016 0458   GFRNONAA 57 (L) 08/05/2014 1839   GFRAA 54 (L) 10/11/2016 0458   GFRAA >60 08/05/2014 1839    Lipid Panel     Component Value Date/Time   CHOL 104 11/22/2016 1019   TRIG 154.0 (H) 11/22/2016 1019   HDL 43.80 11/22/2016 1019   CHOLHDL 2 11/22/2016 1019   VLDL 30.8 11/22/2016 1019   LDLCALC 29 11/22/2016 1019    CBC    Component Value Date/Time   WBC 16.2 (H) 10/11/2016 0458   RBC 5.26 10/11/2016 0458   HGB 15.1 10/11/2016 0458   HGB 13.3 08/05/2014 1839   HCT 43.6 10/11/2016 0458   HCT 40.1 08/05/2014 1839   PLT 243 10/11/2016 0458   PLT 246 08/05/2014 1839   MCV 82.9 10/11/2016 0458   MCV 83 08/05/2014 1839   MCH 28.7 10/11/2016 0458   MCHC 34.6 10/11/2016 0458   RDW 17.0 (H) 10/11/2016 0458   RDW 16.3 (H) 08/05/2014 1839   LYMPHSABS 3.3 05/20/2016 1041   LYMPHSABS 2.3 08/05/2014 1839   MONOABS 1.7 (H) 05/20/2016 1041   MONOABS 2.0 (H) 08/05/2014 1839   EOSABS 0.2 05/20/2016 1041   EOSABS 0.1 08/05/2014 1839  BASOSABS 0.1 05/20/2016 1041   BASOSABS 0.1 08/05/2014 1839    Hgb A1C Lab Results  Component Value Date   HGBA1C 6.7 (H) 02/15/2017           Assessment & Plan:   DM 2 with Hypoglycemia:  Will repeat A1C today If < 7, will likely need to scale back on Glipizide to 5 mg BID Continue Januvia, Humalog and Lantus at this time Discussed what to do in the case of severe hypoglycemia Discussed eating every few hours to prevent hypoglycemia  Hypothyroidism:  Will check TSH and Free T4 today, to make sure this is not a cause of his hypoglycemia  Will follow up after labs, return precautions discussed Webb Silversmith, NP

## 2017-02-20 ENCOUNTER — Other Ambulatory Visit: Payer: Self-pay | Admitting: Family

## 2017-02-22 ENCOUNTER — Telehealth: Payer: Self-pay | Admitting: Pulmonary Disease

## 2017-02-22 DIAGNOSIS — R911 Solitary pulmonary nodule: Secondary | ICD-10-CM

## 2017-02-22 NOTE — Telephone Encounter (Signed)
Please schedule pt's PET. Thanks.

## 2017-02-22 NOTE — Telephone Encounter (Signed)
Called and spoke with pt's son, Dominica Severin. PET Scan scheduled for Wed. Nov 21 at 8:30 am at [facility]. Pt to arrive at 8:00, NPO after midnight. No gum, sugar or candy. Hold Diabetic medication until after scan.   Son voiced understanding of appointment date, time and location and instructions. Rhonda J Cobb

## 2017-02-22 NOTE — Telephone Encounter (Signed)
Pt son calling stating he would like to schedule the PET scan on patient He states provider mention when they were ready to do this to just give [place] a call   Please advise

## 2017-03-01 ENCOUNTER — Encounter: Payer: Self-pay | Admitting: Pulmonary Disease

## 2017-03-01 ENCOUNTER — Encounter: Payer: Self-pay | Admitting: Internal Medicine

## 2017-03-01 ENCOUNTER — Other Ambulatory Visit: Payer: Self-pay | Admitting: Pulmonary Disease

## 2017-03-01 MED ORDER — ALBUTEROL SULFATE HFA 108 (90 BASE) MCG/ACT IN AERS: INHALATION_SPRAY | RESPIRATORY_TRACT | 2 refills | Status: DC

## 2017-03-01 NOTE — Telephone Encounter (Signed)
Patient requested refill via [email or web address] done

## 2017-03-02 ENCOUNTER — Ambulatory Visit
Admission: RE | Admit: 2017-03-02 | Discharge: 2017-03-02 | Disposition: A | Discharge status: Home / Self Care | Payer: Medicare Other | Source: Ambulatory Visit | Attending: Pulmonary Disease | Admitting: Pulmonary Disease

## 2017-03-02 DIAGNOSIS — R911 Solitary pulmonary nodule: Secondary | ICD-10-CM | POA: Insufficient documentation

## 2017-03-02 DIAGNOSIS — N400 Enlarged prostate without lower urinary tract symptoms: Secondary | ICD-10-CM | POA: Insufficient documentation

## 2017-03-02 DIAGNOSIS — I2510 Atherosclerotic heart disease of native coronary artery without angina pectoris: Secondary | ICD-10-CM | POA: Diagnosis not present

## 2017-03-02 DIAGNOSIS — S2232XA Fracture of one rib, left side, initial encounter for closed fracture: Secondary | ICD-10-CM | POA: Insufficient documentation

## 2017-03-02 DIAGNOSIS — I700 Atherosclerosis of aorta: Secondary | ICD-10-CM | POA: Insufficient documentation

## 2017-03-02 DIAGNOSIS — M65851 Other synovitis and tenosynovitis, right thigh: Secondary | ICD-10-CM | POA: Diagnosis not present

## 2017-03-02 DIAGNOSIS — I517 Cardiomegaly: Secondary | ICD-10-CM | POA: Insufficient documentation

## 2017-03-02 DIAGNOSIS — S2241XA Multiple fractures of ribs, right side, initial encounter for closed fracture: Secondary | ICD-10-CM | POA: Diagnosis not present

## 2017-03-02 DIAGNOSIS — N200 Calculus of kidney: Secondary | ICD-10-CM | POA: Insufficient documentation

## 2017-03-02 DIAGNOSIS — K8020 Calculus of gallbladder without cholecystitis without obstruction: Secondary | ICD-10-CM | POA: Insufficient documentation

## 2017-03-02 LAB — GLUCOSE, CAPILLARY
GLUCOSE-CAPILLARY: 56 mg/dL — AB (ref 65–99)
GLUCOSE-CAPILLARY: 58 mg/dL — AB (ref 65–99)

## 2017-03-02 MED ORDER — FLUDEOXYGLUCOSE F - 18 (FDG) INJECTION
13.0190 | Freq: Once | INTRAVENOUS | Status: AC | PRN
  Administered 2017-03-02: 13.019 via INTRAVENOUS

## 2017-03-03 ENCOUNTER — Other Ambulatory Visit: Payer: Self-pay | Admitting: Internal Medicine

## 2017-03-09 ENCOUNTER — Encounter: Payer: Self-pay | Admitting: Internal Medicine

## 2017-03-24 ENCOUNTER — Other Ambulatory Visit: Payer: Self-pay | Admitting: Internal Medicine

## 2017-03-25 ENCOUNTER — Ambulatory Visit (INDEPENDENT_AMBULATORY_CARE_PROVIDER_SITE_OTHER): Payer: Medicare Other | Admitting: Pulmonary Disease

## 2017-03-25 ENCOUNTER — Encounter: Payer: Self-pay | Admitting: Pulmonary Disease

## 2017-03-25 VITALS — BP 118/82 | HR 72 | Ht 66.0 in | Wt 189.0 lb

## 2017-03-25 DIAGNOSIS — R911 Solitary pulmonary nodule: Secondary | ICD-10-CM

## 2017-03-25 DIAGNOSIS — Z87891 Personal history of nicotine dependence: Secondary | ICD-10-CM

## 2017-03-25 DIAGNOSIS — J449 Chronic obstructive pulmonary disease, unspecified: Secondary | ICD-10-CM

## 2017-03-25 NOTE — Patient Instructions (Signed)
Continue your current medications (Symbicort and Spiriva) as previously Continue albuterol as needed We will arrange for a CT-guided biopsy of the lung nodule to be performed in February after your cataract surgery Follow-up in early to mid March

## 2017-03-28 NOTE — Progress Notes (Signed)
PULMONARY OFFICE FOLLOW UP NOTE  Date of initial consultation: 03/10/15  Reason for consultation: COPD Initial HPI: [name] diagnosed with COPD since approx 2004. Previously followed by Dr [name]. At baseline, he is dyspneic with minimal exertion limited to ambulating no more than 50 meters and less than one flight of stairs. He has little day to day variation. He produces modest amount of white mucus on most days. He has been maintained on Symbicort and Spiriva with PRN albuterol which he uses 1-2 times per day on average. He is also on prednisone 5 mg daily which he believes has been very beneficial. His records from Dr [name] office also indicate a diagnosis of pulmonary fibrosis but his CXR does not seem to demonstrate this and a recent CT abd/pelvis (06/03/14) reveals no fibrosis in the visualized portion of the lung bases. That CT does demonstrate a nodule in the LLL which has not been re-imaged. He also has OSA diagnosed several years ago for which he is on CPAP. He tolerates this well. Lastly, he has chronic AF and is followed by cardiology for this. Initial IMP/PLAN: COPD, Obesity, Restrictive physiology - due to obesity, Severe dyspnea - multifactorial including COPD, CAF, obesity, deconditioning, Lung nodule on CTAP 06/03/14, OSA - well controlled on CPAP, DM2 - hyperglycemia on most recent BMET. PLAN: Cont Symbicort and Spiriva as controller regimen, Cont prednisone @ 5 mg daily for now, Cont PRN albuterol, CT chest to re-eval pulmonary nodule in LLL   DATA: CT chest 04/18/15: Interstitial lung disease characterized by symmetric upper lobe predominant extensive fine nodularity, interlobular septal thickening and ground-glass attenuation. Findings have mildly to moderately progressed since 02/01/2006 chest CT. Stable dominant 8 mm left lower lobe pulmonary nodule, for which 10 month stability has been demonstrated. Stable mild cardiomegaly. Left main and 3 vessel coronary atherosclerosis  CT  chest 10/10/16: Stable changes of interstitial lung disease along with underlying emphysema. There is a rounded slightly spiculated 2.1 cm lesion in the left lower lobe. This measured 8 mm on the prior study and is worrisome for neoplasm. Other small scattered pulmonary nodules appears stable and may be related to the interstitial lung disease PET 03/02/17: 2.5 cm left lower lobe pulmonary nodule with maximum SUV 9.0 compatible with malignancy. No adenopathy or distant metastatic spread identified. Assuming non-small cell lung cancer the appearance is compatible with T1b N0 M0 disease (stage IA).  SUBJ: Return today to discuss finding of recently performed PET scan.  He has no new pulmonary or respiratory complaints. Remains on Spiriva and Symbicort.  Remains on prednisone, 10 mg per day, for left knee arthritis. Denies CP, fever, purulent sputum, hemoptysis, LE edema and calf tenderness.   OBJ:  Vitals:   03/25/17 1149  BP: 118/82  Pulse: 72  SpO2: 93%  Weight: 85.7 kg (189 lb)  Height: 5\' 6"  (1.676 [name])  Room air  EXAM:  Gen: NAD HEENT: WNL, sclerae white Neck: No JVD noted, no palpable LAN Lungs: Diminished BS, no wheezes Cardiovascular: IRIR, rate controlled, no [name] Abdomen: Obese, soft, NT +BS Ext: 1+ symmetric pitting pretibial edema  BMP Latest Ref Rng & Units 11/22/2016 10/18/2016 10/11/2016  Glucose 70 - 99 mg/dL 212(H) 158(H) 136(H)  BUN 6 - 23 mg/dL 34(H) 48(H) 42(H)  Creatinine 0.40 - 1.50 mg/dL 1.54(H) 1.71(H) 1.37(H)  BUN/Creat Ratio 10 - 22 - - -  Sodium 135 - 145 mEq/L 138 137 140  Potassium 3.5 - 5.1 mEq/L 3.5 3.8 3.1(L)  Chloride 96 - 112  mEq/L 95(L) 95(L) 101  CO2 19 - 32 mEq/L 34(H) 35(H) 31  Calcium 8.4 - 10.5 mg/dL 9.3 10.4 8.6(L)   CBC Latest Ref Rng & Units 10/11/2016 10/10/2016 08/17/2016  WBC 3.8 - 10.6 K/uL 16.2(H) 18.1(H) 15.5(H)  Hemoglobin 13.0 - 18.0 g/dL 15.1 15.5 14.7  Hematocrit 40.0 - 52.0 % 43.6 45.0 43.2  Platelets 150 - 440 K/uL 243 272 242.0    No  new CXR  PET noted above   IMPRESSION:   Former smoker COPD - no PFTs available but medically severe Mild interstitial lung disease, NOS Restrictive pulmonary physiology due to ILD and obesity Severe baseline DOE OSA - well controlled on CPAP LLL Lung nodule -imaging studies highly suggestive of malignancy  DISCUSSION: We discussed in detail the findings on PET scan and my concern that this represents malignancy.  We discussed potential therapies that might be available if proven malignant.  Do not think he would be a candidate for surgical resection or even chemotherapy given his rather frail state and advanced age.  However, might benefit significantly from radiation therapy.  After the above discussion, he agrees to proceed with transthoracic needle biopsy  PLAN:  Continue Symbicort and Spiriva Continue albuterol as needed Continue prednisone at current dose Transthoracic needle biopsy requested of interventional radiology  He wishes to wait until he undergoes cataract surgeries in early February  If malignancy is proven, I will refer to oncology Follow-up in 3 months   Merton Border, MD PCCM service Mobile [phone] Pager [phone] 03/28/2017 3:09 PM

## 2017-03-31 ENCOUNTER — Ambulatory Visit: Payer: Self-pay | Admitting: General Practice

## 2017-03-31 ENCOUNTER — Telehealth: Payer: Self-pay | Admitting: *Deleted

## 2017-03-31 NOTE — Telephone Encounter (Signed)
Pt informed of CT biopsy appt and informed to be NPO after midnight. Nothing further needed.

## 2017-04-08 ENCOUNTER — Other Ambulatory Visit: Payer: Self-pay | Admitting: Radiology

## 2017-04-11 ENCOUNTER — Ambulatory Visit
Admission: RE | Admit: 2017-04-11 | Discharge: 2017-04-11 | Disposition: A | Discharge status: Home / Self Care | Payer: Medicare Other | Source: Ambulatory Visit | Attending: Pulmonary Disease | Admitting: Pulmonary Disease

## 2017-04-11 ENCOUNTER — Ambulatory Visit
Admission: RE | Admit: 2017-04-11 | Discharge: 2017-04-11 | Disposition: A | Discharge status: Home / Self Care | Payer: Medicare Other | Source: Ambulatory Visit | Attending: Interventional Radiology | Admitting: Interventional Radiology

## 2017-04-11 DIAGNOSIS — J449 Chronic obstructive pulmonary disease, unspecified: Secondary | ICD-10-CM | POA: Insufficient documentation

## 2017-04-11 DIAGNOSIS — C3432 Malignant neoplasm of lower lobe, left bronchus or lung: Secondary | ICD-10-CM | POA: Insufficient documentation

## 2017-04-11 DIAGNOSIS — R911 Solitary pulmonary nodule: Secondary | ICD-10-CM | POA: Diagnosis present

## 2017-04-11 DIAGNOSIS — Z9889 Other specified postprocedural states: Secondary | ICD-10-CM

## 2017-04-11 LAB — CBC
HCT: 44.2 % (ref 40.0–52.0)
Hemoglobin: 15.1 g/dL (ref 13.0–18.0)
MCH: 29.2 pg (ref 26.0–34.0)
MCHC: 34.1 g/dL (ref 32.0–36.0)
MCV: 85.5 fL (ref 80.0–100.0)
PLATELETS: 198 10*3/uL (ref 150–440)
RBC: 5.17 MIL/uL (ref 4.40–5.90)
RDW: 16.1 % — ABNORMAL HIGH (ref 11.5–14.5)
WBC: 16.5 10*3/uL — AB (ref 3.8–10.6)

## 2017-04-11 LAB — GLUCOSE, CAPILLARY: GLUCOSE-CAPILLARY: 161 mg/dL — AB (ref 65–99)

## 2017-04-11 LAB — APTT: aPTT: 29 seconds (ref 24–36)

## 2017-04-11 LAB — PROTIME-INR
INR: 1.11
PROTHROMBIN TIME: 14.2 s (ref 11.4–15.2)

## 2017-04-11 MED ORDER — HYDROCODONE-ACETAMINOPHEN 5-325 MG PO TABS
1.0000 | ORAL_TABLET | ORAL | Status: DC | PRN
  Filled 2017-04-11: qty 2

## 2017-04-11 MED ORDER — FENTANYL CITRATE (PF) 100 MCG/2ML IJ SOLN
INTRAMUSCULAR | Status: AC | PRN
  Administered 2017-04-11: 50 ug via INTRAVENOUS

## 2017-04-11 MED ORDER — MIDAZOLAM HCL 5 MG/5ML IJ SOLN
INTRAMUSCULAR | Status: AC | PRN
  Administered 2017-04-11: 1 mg via INTRAVENOUS
  Administered 2017-04-11: 0.5 mg via INTRAVENOUS

## 2017-04-11 MED ORDER — LIDOCAINE HCL (PF) 1 % IJ SOLN
INTRAMUSCULAR | Status: AC | PRN
  Administered 2017-04-11: 10 mL

## 2017-04-11 MED ORDER — FENTANYL CITRATE (PF) 100 MCG/2ML IJ SOLN
INTRAMUSCULAR | Status: AC
  Filled 2017-04-11: qty 4

## 2017-04-11 MED ORDER — MIDAZOLAM HCL 5 MG/5ML IJ SOLN
INTRAMUSCULAR | Status: AC
  Filled 2017-04-11: qty 5

## 2017-04-11 MED ORDER — SODIUM CHLORIDE 0.9 % IV SOLN
INTRAVENOUS | Status: DC
  Administered 2017-04-11: 11:00:00 via INTRAVENOUS

## 2017-04-11 NOTE — Procedures (Signed)
  Procedure: CT core LLL nodule 18g x2 EBL:   minimal Complications:  none immediate  See full dictation in [facility].  Dillard Cannon MD Main # [phone] Pager  [phone]

## 2017-04-11 NOTE — Progress Notes (Signed)
FSBS 161

## 2017-04-11 NOTE — Sedation Documentation (Signed)
Hypotensive with sedation-soundly asleep.  200 ml fluid bolus given.

## 2017-04-14 ENCOUNTER — Other Ambulatory Visit: Payer: Self-pay | Admitting: Internal Medicine

## 2017-04-14 ENCOUNTER — Other Ambulatory Visit: Payer: Self-pay | Admitting: *Deleted

## 2017-04-14 DIAGNOSIS — C3490 Malignant neoplasm of unspecified part of unspecified bronchus or lung: Secondary | ICD-10-CM

## 2017-04-14 LAB — SURGICAL PATHOLOGY

## 2017-04-18 ENCOUNTER — Encounter: Payer: Self-pay | Admitting: Internal Medicine

## 2017-04-18 ENCOUNTER — Ambulatory Visit (INDEPENDENT_AMBULATORY_CARE_PROVIDER_SITE_OTHER): Payer: Medicare Other | Admitting: Internal Medicine

## 2017-04-18 VITALS — BP 134/66 | HR 63 | Temp 97.6°F | Wt 190.2 lb

## 2017-04-18 DIAGNOSIS — B029 Zoster without complications: Secondary | ICD-10-CM

## 2017-04-18 MED ORDER — VALACYCLOVIR HCL 1 G PO TABS: 1000.0000 mg | ORAL_TABLET | Freq: Three times a day (TID) | ORAL | 0 refills | Status: DC

## 2017-04-18 MED ORDER — HYDROCODONE-ACETAMINOPHEN 5-325 MG PO TABS: 1.0000 | ORAL_TABLET | Freq: Three times a day (TID) | ORAL | 0 refills | Status: DC | PRN

## 2017-04-18 NOTE — Progress Notes (Signed)
Subjective:    Patient ID: [name]., male    DOB: 20-Oct-1931, 84 y.o.   MRN: [id number]  HPI  Pt presents to the clinic today with c/o a rash. He reports this started 3 days ago. He has lesions on his back, right groin and right leg. He describes the lesion as itching and burning. He has never had a rash like this before and was concerned that it was jock's itch. He has not taken anything OTC for his rash. No one in his household has a similar rash. He denies changes in soaps, lotions or detergents.   Review of Systems  Past Medical History:  Diagnosis Date  . Arthritis   . CAD    a. MI 01/29/1996 tx'd w/ TPA @ [place]; b. Myoview 06/2005: EF 50%, scar @ apex, mild peri-infarct ischemia  . Cancer ([place])    skin  . Chronic atrial fibrillation ([place])    a. since 2006; b. on warfarin  . Chronic diastolic CHF (congestive heart failure) ([place])    a. echo 04/2006: EF lower limits of nl, mod LVH, mild aortic root dilatation, & mild MR, biatrial enlargement; b. echo 04/2013: EF 60%, mod dilated LA, mild MR & TR, mod pulm HTN w/ RV systolic pressure 53, c. echo 04/21/14: EF 55-60%, unable to exclude WMA, severely dilated LA 6.6 cm, nl RVSP, mildly dilated aortic root  . COPD (chronic obstructive pulmonary disease) ([facility])    oxygen prn at home  . CVA [id number]   x2  . DM   . Falls   . GERD (gastroesophageal reflux disease)   . History of kidney stones   . HYPERLIPIDEMIA   . HYPERTENSION   . Kidney stone    a. s/p left ureteral stenting 04/24/14  . Neuropathy of both feet   . Poor balance     Current Outpatient Medications  Medication Sig Dispense Refill  . acetaminophen (TYLENOL) 650 MG CR tablet Take 650 mg by mouth every 8 (eight) hours as needed.      Marland Kitchen albuterol (2.5 MG/3ML) 0.083% NEBU 3 mL, albuterol (5 MG/ML) 0.5% NEBU 0.5 mL Inhale 1 mg into the lungs.    Marland Kitchen albuterol (PROAIR HFA) 108 (90 Base) MCG/ACT inhaler USE 1 TO 2 INHALATIONS EVERY 4 HOURS AS NEEDED 3 Inhaler 2  .  ALPRAZolam (XANAX) 0.25 MG tablet Take 1 tablet (0.25 mg total) by mouth at bedtime as needed for anxiety. 90 tablet 0  . atorvastatin (LIPITOR) 40 MG tablet TAKE 1 TABLET DAILY 90 tablet 3  . budesonide-formoterol (SYMBICORT) 160-4.5 MCG/ACT inhaler Inhale 2 puffs into the lungs 2 (two) times daily. 3 Inhaler 3  . citalopram (CELEXA) 10 MG tablet TAKE 1 TABLET DAILY 90 tablet 1  . Diclofenac Sodium 1.5 % SOLN Place 2 mLs onto the skin 4 (four) times daily. 150 mL 5  . dorzolamide (TRUSOPT) 2 % ophthalmic solution Place 1 drop into both eyes 3 (three) times daily.     Marland Kitchen ELIQUIS 5 MG TABS tablet TAKE 1 TABLET TWICE A DAY 180 tablet 3  . ezetimibe (ZETIA) 10 MG tablet Take 1 tablet (10 mg total) by mouth daily. 90 tablet 1  . finasteride (PROSCAR) 5 MG tablet TAKE 1 TABLET DAILY 90 tablet 4  . gabapentin (NEURONTIN) 100 MG capsule TAKE 1 CAPSULE THREE TIMES A DAY 270 capsule 1  . glipiZIDE (GLUCOTROL) 5 MG tablet Take 1 tablet (5 mg total) 2 (two) times daily before a meal by mouth.  180 tablet 0  . HUMALOG KWIKPEN 100 UNIT/ML KiwkPen INJECT 18 UNITS WITH BREAKFAST, 20 UNITS WITH LUNCH, AND 16 UNITS WITH DINNER 60 mL 0  . Insulin Pen Needle (BD PEN NEEDLE NANO U/F) 32G X 4 MM MISC USE THREE TIMES A DAY FOR INSULIN ADMINISTRATION 270 each 1  . Insulin Syringe-Needle U-100 30G X 1/2" 1 ML MISC 1 each by Does not apply route 3 (three) times daily. 200 each 5  . latanoprost (XALATAN) 0.005 % ophthalmic solution Place 1 drop into both eyes at bedtime.     Marland Kitchen LEVEMIR FLEXTOUCH 100 UNIT/ML Pen INJECT 42 UNITS UNDER THE SKIN DAILY AT 10 P.M. 45 mL 1  . levothyroxine (SYNTHROID, LEVOTHROID) 50 MCG tablet Take 1 tablet (50 mcg total) by mouth daily before breakfast. 90 tablet 1  . metolazone (ZAROXOLYN) 5 MG tablet TAKE 1 TABLET DAILY AS NEEDED 90 tablet 2  . metoprolol succinate (TOPROL-XL) 50 MG 24 hr tablet TAKE 1 TABLET TWICE A DAY 180 tablet 1  . nitroGLYCERIN (NITROSTAT) 0.4 MG SL tablet Place 0.4 mg  under the tongue every 5 (five) minutes as needed.      . nystatin-triamcinolone (MYCOLOG II) cream Apply 1 application topically 2 (two) times daily.     . Potassium Chloride ER 20 MEQ TBCR Take 40 mEq by mouth 2 (two) times daily. 120 tablet 0  . predniSONE (DELTASONE) 10 MG tablet Take 1 tablet (10 mg total) by mouth daily with breakfast. 90 tablet 1  . sitaGLIPtin (JANUVIA) 100 MG tablet Take 1 tablet (100 mg total) by mouth daily. 90 tablet 1  . tamsulosin (FLOMAX) 0.4 MG CAPS capsule TAKE 1 CAPSULE DAILY 90 capsule 1  . tiotropium (SPIRIVA HANDIHALER) 18 MCG inhalation capsule Place 1 capsule (18 mcg total) daily into inhaler and inhale. 90 capsule 3  . torsemide (DEMADEX) 20 MG tablet TAKE 2 TABLETS TWICE A DAY 360 tablet 3  . HYDROcodone-acetaminophen (NORCO/VICODIN) 5-325 MG tablet Take 1 tablet by mouth every 8 (eight) hours as needed for moderate pain. 15 tablet 0  . valACYclovir (VALTREX) 1000 MG tablet Take 1 tablet (1,000 mg total) by mouth 3 (three) times daily. 21 tablet 0   No current facility-administered medications for this visit.     Allergies  Allergen Reactions  . Contrast Media [Iodinated Diagnostic Agents] Shortness Of Breath  . Morphine And Related Other (See Comments)    Hallucinations   . Niacin And Related Dermatitis    Family History  Problem Relation Age of Onset  . Heart disease Mother   . Heart disease Maternal Grandmother   . Diabetes Maternal Grandmother   . Cancer Neg Hx   . Stroke Neg Hx     Social History   Socioeconomic History  . Marital status: Married    Spouse name: Not on file  . Number of children: Not on file  . Years of education: Not on file  . Highest education level: Not on file  Social Needs  . Financial resource strain: Not on file  . Food insecurity - worry: Not on file  . Food insecurity - inability: Not on file  . Transportation needs - medical: Not on file  . Transportation needs - non-medical: Not on file    Occupational History  . Not on file  Tobacco Use  . Smoking status: Former Smoker    Packs/day: 2.00    Years: 40.00    Pack years: 80.00    Types: Cigarettes  Last attempt to quit: 05/24/1990    Years since quitting: 26.9  . Smokeless tobacco: Former Systems developer    Quit date: 05/24/1990  Substance and Sexual Activity  . Alcohol use: No  . Drug use: No  . Sexual activity: Not Currently  Other Topics Concern  . Not on file  Social History Narrative  . Not on file     Constitutional: Denies fever, malaise, fatigue, headache or abrupt weight changes.  Skin: Pt reports rash.    No other specific complaints in a complete review of systems (except as listed in HPI above).     Objective:   Physical Exam   BP 134/66   Pulse 63   Temp 97.6 F (36.4 C) (Oral)   Wt 190 lb 4 oz (86.3 kg)   SpO2 97%   BMI 30.71 kg/m  Wt Readings from Last 3 Encounters:  04/18/17 190 lb 4 oz (86.3 kg)  04/11/17 185 lb (83.9 kg)  03/25/17 189 lb (85.7 kg)    General: Appears his stated age, obese in NAD. Skin: Grouped vesicular lesions on erythematous base noted on midline, back, right groin and right leg following a dermatomal pattern.    BMET    Component Value Date/Time   NA 138 11/22/2016 1019   NA 143 02/21/2015 1108   NA 137 08/05/2014 1839   K 3.5 11/22/2016 1019   K 3.6 08/05/2014 1839   CL 95 (L) 11/22/2016 1019   CL 106 08/05/2014 1839   CO2 34 (H) 11/22/2016 1019   CO2 23 08/05/2014 1839   GLUCOSE 212 (H) 11/22/2016 1019   GLUCOSE 235 (H) 08/05/2014 1839   BUN 34 (H) 11/22/2016 1019   BUN 26 02/21/2015 1108   BUN 19 08/05/2014 1839   CREATININE 1.54 (H) 11/22/2016 1019   CREATININE 1.20 08/05/2014 1839   CALCIUM 9.3 11/22/2016 1019   CALCIUM 8.6 (L) 08/05/2014 1839   GFRNONAA 46 (L) 10/11/2016 0458   GFRNONAA 57 (L) 08/05/2014 1839   GFRAA 54 (L) 10/11/2016 0458   GFRAA >60 08/05/2014 1839    Lipid Panel     Component Value Date/Time   CHOL 104 11/22/2016 1019    TRIG 154.0 (H) 11/22/2016 1019   HDL 43.80 11/22/2016 1019   CHOLHDL 2 11/22/2016 1019   VLDL 30.8 11/22/2016 1019   LDLCALC 29 11/22/2016 1019    CBC    Component Value Date/Time   WBC 16.5 (H) 04/11/2017 1023   RBC 5.17 04/11/2017 1023   HGB 15.1 04/11/2017 1023   HGB 13.3 08/05/2014 1839   HCT 44.2 04/11/2017 1023   HCT 40.1 08/05/2014 1839   PLT 198 04/11/2017 1023   PLT 246 08/05/2014 1839   MCV 85.5 04/11/2017 1023   MCV 83 08/05/2014 1839   MCH 29.2 04/11/2017 1023   MCHC 34.1 04/11/2017 1023   RDW 16.1 (H) 04/11/2017 1023   RDW 16.3 (H) 08/05/2014 1839   LYMPHSABS 3.3 05/20/2016 1041   LYMPHSABS 2.3 08/05/2014 1839   MONOABS 1.7 (H) 05/20/2016 1041   MONOABS 2.0 (H) 08/05/2014 1839   EOSABS 0.2 05/20/2016 1041   EOSABS 0.1 08/05/2014 1839   BASOSABS 0.1 05/20/2016 1041   BASOSABS 0.1 08/05/2014 1839    Hgb A1C Lab Results  Component Value Date   HGBA1C 6.7 (H) 02/15/2017           Assessment & Plan:   Shingles:  eRx for Valtrex 1 gm TID x 7 days eRx for Norco 5-325 mg TID  prn #15 provided He is already taking Gabapentin for neuropathic pain Discussed avoiding contact with infants  <34 years old, people undergoing treatment for cancer, or people you know that have had organ transplants  RTC as needed or if symptoms persist or worsen Talor Cheema, NP

## 2017-04-18 NOTE — Patient Instructions (Signed)

## 2017-04-20 ENCOUNTER — Other Ambulatory Visit: Payer: Self-pay | Admitting: Internal Medicine

## 2017-04-20 ENCOUNTER — Telehealth: Payer: Self-pay | Admitting: Pulmonary Disease

## 2017-04-20 NOTE — Telephone Encounter (Signed)
Patient calling wanting to speak with Nonie Hoyer he was suppose to have eye surgery and visit the cancer doctor on 17th Now has shingles and had to cancel all appts He received his recall letter for Dr Alva Garnet but does not know when he will be better and able to schedule  Please call

## 2017-04-20 NOTE — Telephone Encounter (Signed)
FYI.  Pt has rescheduled his cancer appt to 05/05/17 and I informed him to call us back to schedule his f/u with you once his shingles has gone. Informed pt to call back if he has any further questions. Pt just wanted to make [place] aware.

## 2017-04-21 ENCOUNTER — Other Ambulatory Visit: Payer: Self-pay

## 2017-04-21 DIAGNOSIS — F419 Anxiety disorder, unspecified: Secondary | ICD-10-CM

## 2017-04-21 NOTE — Telephone Encounter (Signed)
Last filled 02/01/2017... Please advise

## 2017-04-22 NOTE — Telephone Encounter (Signed)
Not due until 05/04/17

## 2017-04-25 ENCOUNTER — Institutional Professional Consult (permissible substitution): Payer: Medicare Other | Admitting: Radiation Oncology

## 2017-04-25 ENCOUNTER — Encounter: Payer: Self-pay | Admitting: Internal Medicine

## 2017-04-28 ENCOUNTER — Institutional Professional Consult (permissible substitution): Payer: Medicare Other | Admitting: Radiation Oncology

## 2017-04-29 ENCOUNTER — Other Ambulatory Visit: Payer: Self-pay | Admitting: Internal Medicine

## 2017-04-29 NOTE — Telephone Encounter (Signed)
Lease advise if okay to refill Prednisone

## 2017-05-02 MED ORDER — ALPRAZOLAM 0.25 MG PO TABS: 0.2500 mg | ORAL_TABLET | Freq: Every evening | ORAL | 0 refills | Status: DC | PRN

## 2017-05-05 ENCOUNTER — Other Ambulatory Visit: Payer: Self-pay

## 2017-05-05 ENCOUNTER — Ambulatory Visit
Admission: RE | Admit: 2017-05-05 | Discharge: 2017-05-05 | Disposition: A | Discharge status: Home / Self Care | Payer: Medicare Other | Source: Ambulatory Visit | Attending: Radiation Oncology | Admitting: Radiation Oncology

## 2017-05-05 ENCOUNTER — Encounter: Payer: Self-pay | Admitting: Radiation Oncology

## 2017-05-05 VITALS — BP 105/68 | HR 66 | Temp 97.8°F | Resp 20 | Wt 185.7 lb

## 2017-05-05 DIAGNOSIS — I2510 Atherosclerotic heart disease of native coronary artery without angina pectoris: Secondary | ICD-10-CM | POA: Diagnosis not present

## 2017-05-05 DIAGNOSIS — Z9181 History of falling: Secondary | ICD-10-CM | POA: Insufficient documentation

## 2017-05-05 DIAGNOSIS — I5032 Chronic diastolic (congestive) heart failure: Secondary | ICD-10-CM | POA: Insufficient documentation

## 2017-05-05 DIAGNOSIS — Z794 Long term (current) use of insulin: Secondary | ICD-10-CM | POA: Insufficient documentation

## 2017-05-05 DIAGNOSIS — E119 Type 2 diabetes mellitus without complications: Secondary | ICD-10-CM | POA: Diagnosis not present

## 2017-05-05 DIAGNOSIS — I110 Hypertensive heart disease with heart failure: Secondary | ICD-10-CM | POA: Insufficient documentation

## 2017-05-05 DIAGNOSIS — R2681 Unsteadiness on feet: Secondary | ICD-10-CM | POA: Insufficient documentation

## 2017-05-05 DIAGNOSIS — Z87891 Personal history of nicotine dependence: Secondary | ICD-10-CM | POA: Insufficient documentation

## 2017-05-05 DIAGNOSIS — Z510 Encounter for antineoplastic radiation therapy: Secondary | ICD-10-CM | POA: Diagnosis not present

## 2017-05-05 DIAGNOSIS — Z8673 Personal history of transient ischemic attack (TIA), and cerebral infarction without residual deficits: Secondary | ICD-10-CM | POA: Insufficient documentation

## 2017-05-05 DIAGNOSIS — M129 Arthropathy, unspecified: Secondary | ICD-10-CM | POA: Insufficient documentation

## 2017-05-05 DIAGNOSIS — I482 Chronic atrial fibrillation: Secondary | ICD-10-CM | POA: Insufficient documentation

## 2017-05-05 DIAGNOSIS — R55 Syncope and collapse: Secondary | ICD-10-CM | POA: Insufficient documentation

## 2017-05-05 DIAGNOSIS — Z79899 Other long term (current) drug therapy: Secondary | ICD-10-CM | POA: Insufficient documentation

## 2017-05-05 DIAGNOSIS — K219 Gastro-esophageal reflux disease without esophagitis: Secondary | ICD-10-CM | POA: Diagnosis not present

## 2017-05-05 DIAGNOSIS — G629 Polyneuropathy, unspecified: Secondary | ICD-10-CM | POA: Insufficient documentation

## 2017-05-05 DIAGNOSIS — E785 Hyperlipidemia, unspecified: Secondary | ICD-10-CM | POA: Insufficient documentation

## 2017-05-05 DIAGNOSIS — J449 Chronic obstructive pulmonary disease, unspecified: Secondary | ICD-10-CM | POA: Diagnosis not present

## 2017-05-05 DIAGNOSIS — Z7901 Long term (current) use of anticoagulants: Secondary | ICD-10-CM | POA: Diagnosis not present

## 2017-05-05 DIAGNOSIS — C3432 Malignant neoplasm of lower lobe, left bronchus or lung: Secondary | ICD-10-CM | POA: Insufficient documentation

## 2017-05-05 DIAGNOSIS — Z87442 Personal history of urinary calculi: Secondary | ICD-10-CM | POA: Insufficient documentation

## 2017-05-05 NOTE — Consult Note (Signed)
NEW PATIENT EVALUATION  Name: [name].  MRN: [id number]  Date:   05/05/2017     DOB: 1931-05-22   This 84 y.o. 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  REFERRING PHYSICIAN: Jearld Fenton, NP  CHIEF COMPLAINT:  Chief Complaint  Patient presents with  . Lung Cancer    Pt is here for initial consultation of lung cancer.      DIAGNOSIS: The encounter diagnosis was Cancer of lower lobe of left lung ([place]).   PREVIOUS INVESTIGATIONS:  PET CT and CT scans reviewed Clinical notes reviewed Pathology report reviewed  HPI: Patient is a 84 year old male with syncope and COPD diagnosed in 2004. He has a mild productive clear cough is currently on multiple medications to improve his pulmonary function. Recent follow-up with CT scan demonstrated a nodule in the left lower lobe. He had a PET CT scan showing hypermetabolic activity in the left lower lobe measuring 2.5 cm with a maximum SUV of 9.0 compatible with malignancy. No adenopathy or distant metastatic disease was identified. He underwent CT-guided biopsy which was positive for invasive adenocarcinoma. Based on his multiple medical comorbidities and poor pulmonary function he is now referred to radiation oncology for consideration of SB RT. He is doing fairly well. He states he does has not noticed any decline in his pulmonary functions over the past several months. His P when take is good. He's having no hemoptysis at this time.   PLANNED TREATMENT REGIMEN: SB RT  PAST MEDICAL HISTORY:  has a past medical history of Arthritis, CAD, Cancer ([place]), Chronic atrial fibrillation ([place]), Chronic diastolic CHF (congestive heart failure) ([place]), COPD (chronic obstructive pulmonary disease) ([place]), CVA ([id number],9518), DM, Falls, GERD (gastroesophageal reflux disease), History of kidney stones, HYPERLIPIDEMIA, HYPERTENSION, Kidney stone, Neuropathy of both feet, and Poor balance.     PAST SURGICAL HISTORY:  Past Surgical History:  Procedure Laterality Date  . BLADDER SURGERY     stent placement   . CARDIAC CATHETERIZATION  1997   [facility]  . CAROTID STENT INSERTION  1997  . CIRCUMCISION  2016  . CORONARY ANGIOPLASTY  1997   s/p stent placement x 2   . CYSTOSCOPY W/ URETERAL STENT REMOVAL Left 10/09/2014   Procedure: CYSTOSCOPY WITH STENT REMOVAL;  Surgeon: Hollice Espy, MD;  Location: [facility] ORS;  Service: Urology;  Laterality: Left;  . CYSTOSCOPY WITH STENT PLACEMENT Left 10/09/2014   Procedure: CYSTOSCOPY WITH STENT PLACEMENT;  Surgeon: Hollice Espy, MD;  Location: [facility] ORS;  Service: Urology;  Laterality: Left;  . KIDNEY SURGERY  05/2013   s/p stent placement   . stents ureters Bilateral   . TONSILLECTOMY AND ADENOIDECTOMY  1959  . URETEROSCOPY WITH HOLMIUM LASER LITHOTRIPSY Left 10/09/2014   Procedure: URETEROSCOPY WITH HOLMIUM LASER LITHOTRIPSY;  Surgeon: Hollice Espy, MD;  Location: [facility] ORS;  Service: Urology;  Laterality: Left;    FAMILY HISTORY: family history includes Diabetes in his maternal grandmother; Heart disease in his maternal grandmother and mother.  SOCIAL HISTORY:  reports that he quit smoking about 26 years ago. His smoking use included cigarettes. He has a 80.00 pack-year smoking history. He quit smokeless tobacco use about 26 years ago. He reports that he does not drink alcohol or use drugs.  ALLERGIES: Contrast media [iodinated diagnostic agents]; Morphine and related; and Niacin and related  MEDICATIONS:  Current Outpatient Medications  Medication Sig Dispense Refill  . acetaminophen (TYLENOL) 650 MG CR tablet  Take 650 mg by mouth every 8 (eight) hours as needed.      Marland Kitchen albuterol (2.5 MG/3ML) 0.083% NEBU 3 mL, albuterol (5 MG/ML) 0.5% NEBU 0.5 mL Inhale 1 mg into the lungs.    Marland Kitchen albuterol (PROAIR HFA) 108 (90 Base) MCG/ACT inhaler USE 1 TO 2 INHALATIONS EVERY 4 HOURS AS NEEDED 3 Inhaler 2  . ALPRAZolam (XANAX) 0.25 MG tablet Take 1  tablet (0.25 mg total) by mouth at bedtime as needed for anxiety. 90 tablet 0  . atorvastatin (LIPITOR) 40 MG tablet TAKE 1 TABLET DAILY 90 tablet 3  . budesonide-formoterol (SYMBICORT) 160-4.5 MCG/ACT inhaler Inhale 2 puffs into the lungs 2 (two) times daily. 3 Inhaler 3  . citalopram (CELEXA) 10 MG tablet TAKE 1 TABLET DAILY 90 tablet 1  . Diclofenac Sodium 1.5 % SOLN Place 2 mLs onto the skin 4 (four) times daily. 150 mL 5  . dorzolamide (TRUSOPT) 2 % ophthalmic solution Place 1 drop into both eyes 3 (three) times daily.     Marland Kitchen ELIQUIS 5 MG TABS tablet TAKE 1 TABLET TWICE A DAY 180 tablet 3  . ezetimibe (ZETIA) 10 MG tablet Take 1 tablet (10 mg total) by mouth daily. 90 tablet 1  . finasteride (PROSCAR) 5 MG tablet TAKE 1 TABLET DAILY 90 tablet 4  . gabapentin (NEURONTIN) 100 MG capsule TAKE 1 CAPSULE THREE TIMES A DAY 270 capsule 1  . glipiZIDE (GLUCOTROL) 5 MG tablet TAKE 1 TABLET TWICE A DAY BEFORE MEALS 180 tablet 0  . HUMALOG KWIKPEN 100 UNIT/ML KiwkPen INJECT 18 UNITS WITH BREAKFAST, 20 UNITS WITH LUNCH, AND 16 UNITS WITH DINNER 60 mL 0  . HYDROcodone-acetaminophen (NORCO/VICODIN) 5-325 MG tablet Take 1 tablet by mouth every 8 (eight) hours as needed for moderate pain. 15 tablet 0  . Insulin Pen Needle (BD PEN NEEDLE NANO U/F) 32G X 4 MM MISC USE THREE TIMES A DAY FOR INSULIN ADMINISTRATION 270 each 1  . Insulin Syringe-Needle U-100 30G X 1/2" 1 ML MISC 1 each by Does not apply route 3 (three) times daily. 200 each 5  . latanoprost (XALATAN) 0.005 % ophthalmic solution Place 1 drop into both eyes at bedtime.     Marland Kitchen LEVEMIR FLEXTOUCH 100 UNIT/ML Pen INJECT 42 UNITS UNDER THE SKIN DAILY AT 10 P.M. 45 mL 1  . levothyroxine (SYNTHROID, LEVOTHROID) 50 MCG tablet Take 1 tablet (50 mcg total) by mouth daily before breakfast. 90 tablet 1  . metolazone (ZAROXOLYN) 5 MG tablet TAKE 1 TABLET DAILY AS NEEDED 90 tablet 2  . metoprolol succinate (TOPROL-XL) 50 MG 24 hr tablet TAKE 1 TABLET TWICE A DAY  180 tablet 1  . nitroGLYCERIN (NITROSTAT) 0.4 MG SL tablet Place 0.4 mg under the tongue every 5 (five) minutes as needed.      . nystatin-triamcinolone (MYCOLOG II) cream Apply 1 application topically 2 (two) times daily.     . Potassium Chloride ER 20 MEQ TBCR Take 40 mEq by mouth 2 (two) times daily. 120 tablet 0  . predniSONE (DELTASONE) 10 MG tablet TAKE 1 TABLET DAILY WITH BREAKFAST 90 tablet 1  . sitaGLIPtin (JANUVIA) 100 MG tablet Take 1 tablet (100 mg total) by mouth daily. 90 tablet 1  . tamsulosin (FLOMAX) 0.4 MG CAPS capsule TAKE 1 CAPSULE DAILY 90 capsule 1  . tiotropium (SPIRIVA HANDIHALER) 18 MCG inhalation capsule Place 1 capsule (18 mcg total) daily into inhaler and inhale. 90 capsule 3  . torsemide (DEMADEX) 20 MG tablet TAKE 2 TABLETS  TWICE A DAY 360 tablet 3   No current facility-administered medications for this encounter.     ECOG PERFORMANCE STATUS:  0 - Asymptomatic  REVIEW OF SYSTEMS: Except for the significant dyspnea on exertion and poor pulmonary function  Patient denies any weight loss, fatigue, weakness, fever, chills or night sweats. Patient denies any loss of vision, blurred vision. Patient denies any ringing  of the ears or hearing loss. No irregular heartbeat. Patient denies heart murmur or history of fainting. Patient denies any chest pain or pain radiating to her upper extremities. Patient denies any shortness of breath, difficulty breathing at night, cough or hemoptysis. Patient denies any swelling in the lower legs. Patient denies any nausea vomiting, vomiting of blood, or coffee ground material in the vomitus. Patient denies any stomach pain. Patient states has had normal bowel movements no significant constipation or diarrhea. Patient denies any dysuria, hematuria or significant nocturia. Patient denies any problems walking, swelling in the joints or loss of balance. Patient denies any skin changes, loss of hair or loss of weight. Patient denies any excessive  worrying or anxiety or significant depression. Patient denies any problems with insomnia. Patient denies excessive thirst, polyuria, polydipsia. Patient denies any swollen glands, patient denies easy bruising or easy bleeding. Patient denies any recent infections, allergies or URI. Patient "s visual fields have not changed significantly in recent time.   PHYSICAL EXAM: BP 105/68   Pulse 66   Temp 97.8 F (36.6 C)   Resp 20   Wt 185 lb 11.8 oz (84.2 kg)   BMI 29.98 kg/m  Well-developed slightly obese male in NAD. Well-developed well-nourished patient in NAD. HEENT reveals PERLA, EOMI, discs not visualized.  Oral cavity is clear. No oral mucosal lesions are identified. Neck is clear without evidence of cervical or supraclavicular adenopathy. Lungs are clear to A&P. Cardiac examination is essentially unremarkable with regular rate and rhythm without murmur rub or thrill. Abdomen is benign with no organomegaly or masses noted. Motor sensory and DTR levels are equal and symmetric in the upper and lower extremities. Cranial nerves II through XII are grossly intact. Proprioception is intact. No peripheral adenopathy or edema is identified. No motor or sensory levels are noted. Crude visual fields are within normal range.  LABORATORY DATA: Pathology reports reviewed    RADIOLOGY RESULTS: CT scan PET CT scan reviewed   IMPRESSION: Stage I invasive adenocarcinoma the left lower lobe in 84 year old male with significant medical comorbidities as well as significant COPD.  PLAN: At this time I believe patient be an excellent candidate for SB RT. I would plan on delivering 6000 cGy in 5 fractions. I would use PET CT fusion as well as4D study during our treatment planning. Risks and benefits of treatment including possible increased cough possible slight chance of skin reaction dysphasia and chest wall pain all were discussed in detail with the patient and his son. I have personally ordered CT simulation with  respiratory motion degradation for early next week. Patient and son both seem to comprehend my treatment plan well.  I would like to take this opportunity to thank you for allowing me to participate in the care of your patient.Noreene Filbert, MD

## 2017-05-11 ENCOUNTER — Ambulatory Visit
Admission: RE | Admit: 2017-05-11 | Discharge: 2017-05-11 | Disposition: A | Discharge status: Home / Self Care | Payer: Medicare Other | Source: Ambulatory Visit | Attending: Radiation Oncology | Admitting: Radiation Oncology

## 2017-05-11 DIAGNOSIS — C3432 Malignant neoplasm of lower lobe, left bronchus or lung: Secondary | ICD-10-CM | POA: Diagnosis not present

## 2017-05-12 ENCOUNTER — Ambulatory Visit (INDEPENDENT_AMBULATORY_CARE_PROVIDER_SITE_OTHER): Payer: Medicare Other | Admitting: Family Medicine

## 2017-05-12 ENCOUNTER — Encounter: Payer: Self-pay | Admitting: Family Medicine

## 2017-05-12 ENCOUNTER — Telehealth: Payer: Self-pay | Admitting: Family Medicine

## 2017-05-12 VITALS — BP 118/60 | HR 71 | Temp 97.8°F | Wt 185.5 lb

## 2017-05-12 DIAGNOSIS — Z794 Long term (current) use of insulin: Secondary | ICD-10-CM

## 2017-05-12 DIAGNOSIS — I5032 Chronic diastolic (congestive) heart failure: Secondary | ICD-10-CM | POA: Diagnosis not present

## 2017-05-12 DIAGNOSIS — R600 Localized edema: Secondary | ICD-10-CM | POA: Diagnosis not present

## 2017-05-12 DIAGNOSIS — I63419 Cerebral infarction due to embolism of unspecified middle cerebral artery: Secondary | ICD-10-CM

## 2017-05-12 DIAGNOSIS — L97921 Non-pressure chronic ulcer of unspecified part of left lower leg limited to breakdown of skin: Secondary | ICD-10-CM

## 2017-05-12 DIAGNOSIS — R0989 Other specified symptoms and signs involving the circulatory and respiratory systems: Secondary | ICD-10-CM

## 2017-05-12 DIAGNOSIS — E1159 Type 2 diabetes mellitus with other circulatory complications: Secondary | ICD-10-CM | POA: Diagnosis not present

## 2017-05-12 DIAGNOSIS — L03116 Cellulitis of left lower limb: Secondary | ICD-10-CM

## 2017-05-12 MED ORDER — CEPHALEXIN 500 MG PO CAPS: 500.0000 mg | ORAL_CAPSULE | Freq: Two times a day (BID) | ORAL | 0 refills | Status: DC

## 2017-05-12 NOTE — Telephone Encounter (Signed)
Dr. Darnell Level, I called [facility] in [place] and they cannot see pt until 05/25/17. Pt wants done next week before he starts radiation treatment. He does not want to go to [place].   [facility] can do it next week but order needs to be changed. If you want bilateral leg [place] then there needs not to be dx for R leg ulcer.   If you want just R leg [place] use IMG 2220   If you want bilateral leg [place] use IMG 2217

## 2017-05-12 NOTE — Progress Notes (Signed)
BP 118/60 (BP Location: Right Arm, Patient Position: Sitting, Cuff Size: Large)   Pulse 71   Temp 97.8 F (36.6 C) (Oral)   Wt 185 lb 8 oz (84.1 kg)   SpO2 98%   BMI 29.94 kg/m    CC: BLE swelling L>R Subjective:    Patient ID: Shawn Grace., male    DOB: 02-Apr-1932, 84 y.o.   MRN: [id number]  HPI: Shawn Gales. is a 84 y.o. male presenting on 05/12/2017 for Leg Swelling (bilateral LE swelling, worse in lower left. also redness and drainage off and on for last two years. pt concern due to diabetes.)   Here with daughter. Sees podiatrist regularly - known diabetic neuropathy  Several year h/o L>R swelling and weeping,  Wife has been using medicated pad to help treat weeping/swelling with anterior blistering. Increasing redness to anterior shin over last few weeks. No pain (neuropathy), no fevers/chills, nausea.  He does regularly wear compression stockings.  He takes prednisone 10mg  daily for L knee pain.   Known diastolic CHF managed on torsemide 40mg  bid. Regularly sees Dr Rockey Situ. Has metolazone PRN.  5 lb weight loss over the past year.   H/o CVA with residual L hemiparesis. L leg swells more than left Planned starting radiation treatment for L lung cancer. Diabetic - overall well controlled on current regimen  Lab Results  Component Value Date   HGBA1C 6.7 (H) 02/15/2017    Relevant past medical, surgical, family and social history reviewed and updated as indicated. Interim medical history since our last visit reviewed. Allergies and medications reviewed and updated. Outpatient Medications Prior to Visit  Medication Sig Dispense Refill  . acetaminophen (TYLENOL) 650 MG CR tablet Take 650 mg by mouth every 8 (eight) hours as needed.      Marland Kitchen albuterol (2.5 MG/3ML) 0.083% NEBU 3 mL, albuterol (5 MG/ML) 0.5% NEBU 0.5 mL Inhale 1 mg into the lungs.    Marland Kitchen albuterol (PROAIR HFA) 108 (90 Base) MCG/ACT inhaler USE 1 TO 2 INHALATIONS EVERY 4 HOURS AS NEEDED 3 Inhaler 2    . ALPRAZolam (XANAX) 0.25 MG tablet Take 1 tablet (0.25 mg total) by mouth at bedtime as needed for anxiety. 90 tablet 0  . atorvastatin (LIPITOR) 40 MG tablet TAKE 1 TABLET DAILY 90 tablet 3  . budesonide-formoterol (SYMBICORT) 160-4.5 MCG/ACT inhaler Inhale 2 puffs into the lungs 2 (two) times daily. 3 Inhaler 3  . citalopram (CELEXA) 10 MG tablet TAKE 1 TABLET DAILY 90 tablet 1  . Diclofenac Sodium 1.5 % SOLN Place 2 mLs onto the skin 4 (four) times daily. 150 mL 5  . dorzolamide (TRUSOPT) 2 % ophthalmic solution Place 1 drop into both eyes 3 (three) times daily.     Marland Kitchen ELIQUIS 5 MG TABS tablet TAKE 1 TABLET TWICE A DAY 180 tablet 3  . ezetimibe (ZETIA) 10 MG tablet Take 1 tablet (10 mg total) by mouth daily. 90 tablet 1  . finasteride (PROSCAR) 5 MG tablet TAKE 1 TABLET DAILY 90 tablet 4  . gabapentin (NEURONTIN) 100 MG capsule TAKE 1 CAPSULE THREE TIMES A DAY 270 capsule 1  . glipiZIDE (GLUCOTROL) 5 MG tablet TAKE 1 TABLET TWICE A DAY BEFORE MEALS 180 tablet 0  . HUMALOG KWIKPEN 100 UNIT/ML KiwkPen INJECT 18 UNITS WITH BREAKFAST, 20 UNITS WITH LUNCH, AND 16 UNITS WITH DINNER 60 mL 0  . HYDROcodone-acetaminophen (NORCO/VICODIN) 5-325 MG tablet Take 1 tablet by mouth every 8 (eight) hours as needed for moderate  pain. 15 tablet 0  . Insulin Pen Needle (BD PEN NEEDLE NANO U/F) 32G X 4 MM MISC USE THREE TIMES A DAY FOR INSULIN ADMINISTRATION 270 each 1  . Insulin Syringe-Needle U-100 30G X 1/2" 1 ML MISC 1 each by Does not apply route 3 (three) times daily. 200 each 5  . latanoprost (XALATAN) 0.005 % ophthalmic solution Place 1 drop into both eyes at bedtime.     Marland Kitchen LEVEMIR FLEXTOUCH 100 UNIT/ML Pen INJECT 42 UNITS UNDER THE SKIN DAILY AT 10 P.M. 45 mL 1  . levothyroxine (SYNTHROID, LEVOTHROID) 50 MCG tablet Take 1 tablet (50 mcg total) by mouth daily before breakfast. 90 tablet 1  . metolazone (ZAROXOLYN) 5 MG tablet TAKE 1 TABLET DAILY AS NEEDED 90 tablet 2  . metoprolol succinate (TOPROL-XL) 50  MG 24 hr tablet TAKE 1 TABLET TWICE A DAY 180 tablet 1  . nitroGLYCERIN (NITROSTAT) 0.4 MG SL tablet Place 0.4 mg under the tongue every 5 (five) minutes as needed.      . nystatin-triamcinolone (MYCOLOG II) cream Apply 1 application topically 2 (two) times daily.     . Potassium Chloride ER 20 MEQ TBCR Take 40 mEq by mouth 2 (two) times daily. 120 tablet 0  . predniSONE (DELTASONE) 10 MG tablet TAKE 1 TABLET DAILY WITH BREAKFAST 90 tablet 1  . sitaGLIPtin (JANUVIA) 100 MG tablet Take 1 tablet (100 mg total) by mouth daily. 90 tablet 1  . tamsulosin (FLOMAX) 0.4 MG CAPS capsule TAKE 1 CAPSULE DAILY 90 capsule 1  . tiotropium (SPIRIVA HANDIHALER) 18 MCG inhalation capsule Place 1 capsule (18 mcg total) daily into inhaler and inhale. 90 capsule 3  . torsemide (DEMADEX) 20 MG tablet TAKE 2 TABLETS TWICE A DAY 360 tablet 3   No facility-administered medications prior to visit.      Per HPI unless specifically indicated in ROS section below Review of Systems     Objective:    BP 118/60 (BP Location: Right Arm, Patient Position: Sitting, Cuff Size: Large)   Pulse 71   Temp 97.8 F (36.6 C) (Oral)   Wt 185 lb 8 oz (84.1 kg)   SpO2 98%   BMI 29.94 kg/m   Wt Readings from Last 3 Encounters:  05/12/17 185 lb 8 oz (84.1 kg)  05/05/17 185 lb 11.8 oz (84.2 kg)  04/18/17 190 lb 4 oz (86.3 kg)    Physical Exam  Constitutional: He appears well-developed and well-nourished. No distress.  Walks with rolling walker  Musculoskeletal: He exhibits edema (2+ L, 1+ R pitting edema to mid calf).  Pulses not palpable  Skin: Skin is warm and dry. There is erythema (LLE anterior shin around ulcer).  Anterior shin LLE with shallow <1cm ulcer with surrounding erythema  Nursing note and vitals reviewed.    Lab Results  Component Value Date   CREATININE 1.54 (H) 11/22/2016   BUN 34 (H) 11/22/2016   NA 138 11/22/2016   K 3.5 11/22/2016   CL 95 (L) 11/22/2016   CO2 34 (H) 11/22/2016      Assessment  & Plan:   Problem List Items Addressed This Visit    Chronic diastolic CHF (congestive heart failure) (HCC)   CVA (cerebral vascular accident) ([place])   Diabetes type 2, controlled ([place]) (Chronic)   Leg ulcer, left, limited to breakdown of skin ([place]) - Primary    New, anticipate venous insufficiency ulcer, but with worsening LLE pedal edema and surrounding erythema concern for developing cellulitis - will cover  with 7d keflex course, renally dosed BID. If no better, would consider unna boot however would want to ensure good arterial circulation so I have ordered ABIs today.       Relevant Orders   VAS [place] LE ART SEG MULTI (Segm&LE Reynauds)   Pedal edema    Longstanding. Continue torsemide, has metolazone PRN weight gain.        Other Visit Diagnoses    Left leg cellulitis           Follow up plan: No Follow-up on file.  Ria Bush, MD

## 2017-05-12 NOTE — Telephone Encounter (Signed)
Ordered [id number] thanks

## 2017-05-12 NOTE — Telephone Encounter (Signed)
Spoke to [facility], they can do ABI LE with IMG 2194. Please advise

## 2017-05-12 NOTE — Patient Instructions (Addendum)
We will check artery circulation study - see our referral coordinator on your way out.  You have leg ulcer with ongoing leg swelling Treat with antibiotic. If no better with this, we may use unna boot - but first check circulation.  Elevate legs Continue water pill and compression stockings.

## 2017-05-12 NOTE — Assessment & Plan Note (Signed)
New, anticipate venous insufficiency ulcer, but with worsening LLE pedal edema and surrounding erythema concern for developing cellulitis - will cover with 7d keflex course, renally dosed BID. If no better, would consider unna boot however would want to ensure good arterial circulation so I have ordered ABIs today.

## 2017-05-12 NOTE — Assessment & Plan Note (Signed)
Longstanding. Continue torsemide, has metolazone PRN weight gain.

## 2017-05-12 NOTE — Telephone Encounter (Signed)
I want ankle brachial index (ABI) arterial evaluation, not just arterial ultrasound. Do you know if [facility] will do ABI with that order? I wanted bilateral ABIs. Can use code diminished pulses as well.

## 2017-05-18 ENCOUNTER — Ambulatory Visit
Admission: RE | Admit: 2017-05-18 | Discharge: 2017-05-18 | Disposition: A | Discharge status: Home / Self Care | Payer: Medicare Other | Source: Ambulatory Visit | Attending: Family Medicine | Admitting: Family Medicine

## 2017-05-18 DIAGNOSIS — R600 Localized edema: Secondary | ICD-10-CM | POA: Insufficient documentation

## 2017-05-18 DIAGNOSIS — L97921 Non-pressure chronic ulcer of unspecified part of left lower leg limited to breakdown of skin: Secondary | ICD-10-CM

## 2017-05-18 DIAGNOSIS — L97221 Non-pressure chronic ulcer of left calf limited to breakdown of skin: Secondary | ICD-10-CM | POA: Insufficient documentation

## 2017-05-18 DIAGNOSIS — Z794 Long term (current) use of insulin: Secondary | ICD-10-CM | POA: Diagnosis not present

## 2017-05-18 DIAGNOSIS — E1151 Type 2 diabetes mellitus with diabetic peripheral angiopathy without gangrene: Secondary | ICD-10-CM | POA: Diagnosis not present

## 2017-05-18 DIAGNOSIS — R0989 Other specified symptoms and signs involving the circulatory and respiratory systems: Secondary | ICD-10-CM | POA: Diagnosis not present

## 2017-05-18 DIAGNOSIS — E11622 Type 2 diabetes mellitus with other skin ulcer: Secondary | ICD-10-CM | POA: Diagnosis not present

## 2017-05-18 DIAGNOSIS — I5032 Chronic diastolic (congestive) heart failure: Secondary | ICD-10-CM | POA: Diagnosis not present

## 2017-05-18 DIAGNOSIS — E1159 Type 2 diabetes mellitus with other circulatory complications: Secondary | ICD-10-CM

## 2017-05-20 DIAGNOSIS — C3432 Malignant neoplasm of lower lobe, left bronchus or lung: Secondary | ICD-10-CM | POA: Diagnosis not present

## 2017-05-23 ENCOUNTER — Ambulatory Visit: Payer: Medicare Other

## 2017-05-24 ENCOUNTER — Ambulatory Visit
Admission: RE | Admit: 2017-05-24 | Discharge: 2017-05-24 | Disposition: A | Discharge status: Home / Self Care | Payer: Medicare Other | Source: Ambulatory Visit | Attending: Radiation Oncology | Admitting: Radiation Oncology

## 2017-05-24 DIAGNOSIS — C3432 Malignant neoplasm of lower lobe, left bronchus or lung: Secondary | ICD-10-CM | POA: Diagnosis not present

## 2017-05-25 ENCOUNTER — Ambulatory Visit: Payer: Medicare Other

## 2017-05-26 ENCOUNTER — Ambulatory Visit
Admission: RE | Admit: 2017-05-26 | Discharge: 2017-05-26 | Disposition: A | Discharge status: Home / Self Care | Payer: Medicare Other | Source: Ambulatory Visit | Attending: Radiation Oncology | Admitting: Radiation Oncology

## 2017-05-26 DIAGNOSIS — C3432 Malignant neoplasm of lower lobe, left bronchus or lung: Secondary | ICD-10-CM | POA: Diagnosis not present

## 2017-05-30 ENCOUNTER — Ambulatory Visit: Payer: Medicare Other

## 2017-05-31 ENCOUNTER — Ambulatory Visit
Admission: RE | Admit: 2017-05-31 | Discharge: 2017-05-31 | Disposition: A | Discharge status: Home / Self Care | Payer: Medicare Other | Source: Ambulatory Visit | Attending: Radiation Oncology | Admitting: Radiation Oncology

## 2017-05-31 DIAGNOSIS — C3432 Malignant neoplasm of lower lobe, left bronchus or lung: Secondary | ICD-10-CM | POA: Diagnosis not present

## 2017-06-01 ENCOUNTER — Ambulatory Visit: Payer: Medicare Other

## 2017-06-06 ENCOUNTER — Ambulatory Visit: Payer: Medicare Other

## 2017-06-07 ENCOUNTER — Ambulatory Visit
Admission: RE | Admit: 2017-06-07 | Discharge: 2017-06-07 | Disposition: A | Discharge status: Home / Self Care | Payer: Medicare Other | Source: Ambulatory Visit | Attending: Radiation Oncology | Admitting: Radiation Oncology

## 2017-06-07 DIAGNOSIS — C3432 Malignant neoplasm of lower lobe, left bronchus or lung: Secondary | ICD-10-CM | POA: Diagnosis not present

## 2017-06-08 ENCOUNTER — Ambulatory Visit: Payer: Medicare Other

## 2017-06-09 ENCOUNTER — Ambulatory Visit
Admission: RE | Admit: 2017-06-09 | Discharge: 2017-06-09 | Disposition: A | Discharge status: Home / Self Care | Payer: Medicare Other | Source: Ambulatory Visit | Attending: Radiation Oncology | Admitting: Radiation Oncology

## 2017-06-09 DIAGNOSIS — C3432 Malignant neoplasm of lower lobe, left bronchus or lung: Secondary | ICD-10-CM | POA: Diagnosis not present

## 2017-06-14 ENCOUNTER — Other Ambulatory Visit: Payer: Self-pay | Admitting: Cardiovascular Disease

## 2017-06-18 ENCOUNTER — Other Ambulatory Visit: Payer: Self-pay | Admitting: Internal Medicine

## 2017-06-20 ENCOUNTER — Telehealth: Payer: Self-pay | Admitting: Cardiovascular Disease

## 2017-06-20 NOTE — Telephone Encounter (Addendum)
Called patient. States he takes metolazone 5 mg about once a week. About once a week he may gain 5-7 pounds overnight and then take the metolazone and go back down to normal weight. Normal dry weight is around 180-182lb. 3/9 186 lb Took Metolazone. 3/10 179 lb  3/11 178.6 lb BP 99/70, Feels some dizziness. BP usually runs 90-100/46-66. Dizziness has subsided.  Patient takes Torsemide 40 mg in the morning and 40 mg at lunch time. Takes Potassium 40 mEq in the morning and 40 in the evening. Takes Metoprolol 50 mg BID. Shortness of breath and swelling are present but at patient's baseline. From office visit on 01/2017 with Dr Rockey Situ: "Chronic diastolic CHF (congestive heart failure) ([place]) - Plan: EKG 12-Lead Long discussion concerning his CHF symptoms Seems to be stable with mildly fluctuating weights  torsemide 40 milligrams twice a day with half dose metolazone for weight 188 pounds. This seems to work for him"  According to the patient, this is the first time he's had the dizziness. Patient will continue to monitor and call us back if symptom continues.

## 2017-06-20 NOTE — Telephone Encounter (Signed)
[place] case manager calling  Patient has been on Metolazone 5 MG Patient has been dropping over 5 pounds in weight overnight while taking this medication - it has become consistent  This morning patient was down 7 1/2 pounds his BP was 99/70 and having dizziness  Tim suggests the patient could cut back to about a half (2.5 MG) of the medication  Please call to discuss    Any orders or changes can be discussed directly with patient

## 2017-06-22 ENCOUNTER — Other Ambulatory Visit: Payer: Self-pay | Admitting: Internal Medicine

## 2017-06-27 ENCOUNTER — Other Ambulatory Visit: Payer: Self-pay | Admitting: Internal Medicine

## 2017-06-29 ENCOUNTER — Telehealth: Payer: Self-pay | Admitting: Internal Medicine

## 2017-06-29 NOTE — Telephone Encounter (Signed)
Done, given back to Robin 

## 2017-06-29 NOTE — Telephone Encounter (Signed)
fmla in [facility] in box  For review and signature

## 2017-06-30 NOTE — Telephone Encounter (Signed)
Paperwork faxed Copy for pt Copy for scan

## 2017-06-30 NOTE — Telephone Encounter (Signed)
Left message asking pt son to call office Please let him know paperwork has been faxed and a copy here for him

## 2017-07-01 ENCOUNTER — Other Ambulatory Visit: Payer: Self-pay | Admitting: Internal Medicine

## 2017-07-07 ENCOUNTER — Other Ambulatory Visit: Payer: Self-pay | Admitting: *Deleted

## 2017-07-07 ENCOUNTER — Encounter: Payer: Self-pay | Admitting: Pulmonary Disease

## 2017-07-07 ENCOUNTER — Ambulatory Visit
Admission: RE | Admit: 2017-07-07 | Discharge: 2017-07-07 | Disposition: A | Discharge status: Home / Self Care | Payer: Medicare Other | Source: Ambulatory Visit | Attending: Radiation Oncology | Admitting: Radiation Oncology

## 2017-07-07 ENCOUNTER — Other Ambulatory Visit: Payer: Self-pay

## 2017-07-07 ENCOUNTER — Ambulatory Visit (INDEPENDENT_AMBULATORY_CARE_PROVIDER_SITE_OTHER): Payer: Medicare Other | Admitting: Pulmonary Disease

## 2017-07-07 VITALS — BP 111/74 | Temp 97.7°F | Resp 16 | Ht 66.0 in | Wt 181.0 lb

## 2017-07-07 VITALS — BP 110/66 | HR 77 | Ht 66.0 in | Wt 182.0 lb

## 2017-07-07 DIAGNOSIS — Z87891 Personal history of nicotine dependence: Secondary | ICD-10-CM | POA: Diagnosis not present

## 2017-07-07 DIAGNOSIS — J449 Chronic obstructive pulmonary disease, unspecified: Secondary | ICD-10-CM

## 2017-07-07 DIAGNOSIS — G4733 Obstructive sleep apnea (adult) (pediatric): Secondary | ICD-10-CM

## 2017-07-07 DIAGNOSIS — C3432 Malignant neoplasm of lower lobe, left bronchus or lung: Secondary | ICD-10-CM | POA: Insufficient documentation

## 2017-07-07 DIAGNOSIS — Z923 Personal history of irradiation: Secondary | ICD-10-CM | POA: Insufficient documentation

## 2017-07-07 DIAGNOSIS — J849 Interstitial pulmonary disease, unspecified: Secondary | ICD-10-CM

## 2017-07-07 NOTE — Progress Notes (Signed)
Radiation Oncology Follow up Note  Name: [name].   Date:   07/07/2017 MRN:  [id number] DOB: 1932-02-07    This 84 y.o. male presents to the clinic today for ne-month follow-up status post SB RT to his left lower lobe for stage I adenocarcinoma.  REFERRING PROVIDER: Jearld Fenton, NP  HPI: patient is a 84 year old male now one month out having completed SB RT to his left lower lobe for stage I adenocarcinoma seen today in routine follow-up he is doing well still. He specifically denies cough hemoptysis chest tightness. He was site slightly fatigue after treatment although is had no residual side effects. No imaging has been performed at this time.  COMPLICATIONS OF TREATMENT: none  FOLLOW UP COMPLIANCE: keeps appointments   PHYSICAL EXAM:  BP 111/74 (BP Location: Left Wrist, Patient Position: Sitting, Cuff Size: Small)   Temp 97.7 F (36.5 C) (Tympanic)   Resp 16   Ht 5\' 6"  (1.676 m)   Wt 181 lb (82.1 kg)   BMI 29.21 kg/m  Well-developed slightly obese male wheelchair-bound in NAD. Well-developed well-nourished patient in NAD. HEENT reveals PERLA, EOMI, discs not visualized.  Oral cavity is clear. No oral mucosal lesions are identified. Neck is clear without evidence of cervical or supraclavicular adenopathy. Lungs are clear to A&P. Cardiac examination is essentially unremarkable with regular rate and rhythm without murmur rub or thrill. Abdomen is benign with no organomegaly or masses noted. Motor sensory and DTR levels are equal and symmetric in the upper and lower extremities. Cranial nerves II through XII are grossly intact. Proprioception is intact. No peripheral adenopathy or edema is identified. No motor or sensory levels are noted. Crude visual fields are within normal range.  RADIOLOGY RESULTS: cT scan will be ordered in 2 months  PLAN: present time patient is doing well no residual side effects from SB RT. I'm please was overall progress I've asked to see him back  in approximately 3 months and will have a CT scan with contrast of his chest performed prior to that visit. Patient knows to call with any concerns.  I would like to take this opportunity to thank you for allowing me to participate in the care of your patient.Noreene Filbert, MD

## 2017-07-07 NOTE — Patient Instructions (Signed)
Continue Symbicort, Spiriva as currently prescribed Continue CPAP with sleep Follow-up in 6 months or sooner as needed

## 2017-07-09 NOTE — Progress Notes (Signed)
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 Findings have mildly to moderately progressed since 02/01/2006 chest CT. Stable dominant 8 mm left lower lobe pulmonary nodule, for which 10 month stability has been demonstrated. Stable mild cardiomegaly. Left main and 3 vessel coronary atherosclerosis  CT chest 10/10/16: Stable changes of interstitial lung disease along with underlying emphysema. There is a rounded slightly spiculated 2.1 cm lesion in the left lower lobe. This measured 8 mm on the prior study and is worrisome for neoplasm. Other small scattered pulmonary nodules appears stable and may be related to the interstitial lung disease PET 03/02/17: 2.5 cm left lower lobe pulmonary nodule with maximum SUV 9.0 compatible with malignancy. No adenopathy or distant metastatic spread identified. Assuming non-small cell lung cancer the appearance is compatible with T1b N0 M0 disease (stage IA). TTNA LLL 04/11/17: adenocarcinoma  INTERVAL: Lung Ca diagnosed. XRT course completed. Suffered shingles  SUBJ: Routine re-eval. No new complaints. Has completed XRT. Tolerated it well. Remains on Spiriva and Symbicort.  Remains on prednisone, 10 mg per day, for left knee arthritis. Using CPAP with O2 bleed in. Rarely uses O2 during day. Rarely uses albuterol rescue inhaler. Denies CP, fever, purulent sputum, hemoptysis, LE edema and calf tenderness.   OBJ:  Vitals:   07/07/17 1021 07/07/17 1032   BP:  110/66  Pulse:  77  SpO2:  95%  Weight: 182 lb (82.6 kg)   Height: 5\' 6"  (1.676 m)   Room air  EXAM:  Gen: NAD HEENT: no acute findings Lungs: Diminished BS, no wheezes or other adventitious sounds Cardiovascular: IRIR, rate controlled, no M Abdomen: Obese, soft, NT +BS Ext: NSC 1+ symmetric pitting pretibial edema  BMP Latest Ref Rng & Units 11/22/2016 10/18/2016 10/11/2016  Glucose 70 - 99 mg/dL 212(H) 158(H) 136(H)  BUN 6 - 23 mg/dL 34(H) 48(H) 42(H)  Creatinine 0.40 - 1.50 mg/dL 1.54(H) 1.71(H) 1.37(H)  BUN/Creat Ratio 10 - 22 - - -  Sodium 135 - 145 mEq/L 138 137 140  Potassium 3.5 - 5.1 mEq/L 3.5 3.8 3.1(L)  Chloride 96 - 112 mEq/L 95(L) 95(L) 101  CO2 19 - 32 mEq/L 34(H) 35(H) 31  Calcium 8.4 - 10.5 mg/dL 9.3 10.4 8.6(L)   CBC Latest Ref Rng & Units 04/11/2017 10/11/2016 10/10/2016  WBC 3.8 - 10.6 K/uL 16.5(H) 16.2(H) 18.1(H)  Hemoglobin 13.0 - 18.0 g/dL 15.1 15.1 15.5  Hematocrit 40.0 - 52.0 % 44.2 43.6 45.0  Platelets 150 - 440 K/uL 198 243 272    No recent CXR   IMPRESSION:   Former smoker COPD - no PFTs available but clinically severe Mild interstitial lung disease, NOS Restrictive pulmonary physiology due to ILD and obesity Severe baseline DOE OSA with nocturnal hypoxemia - well controlled on CPAP + O2 LLL adenocarcinoma, S/P XRT   PLAN:  Continue Symbicort and Spiriva Continue albuterol as needed Prednisone dosing (for arthritis) per other MDs Cont CPAP + O2 Follow-up in 6 months   Merton Border, MD PCCM service Mobile [phone] Pager [phone] 07/09/2017 11:01 AM

## 2017-07-12 ENCOUNTER — Other Ambulatory Visit: Payer: Self-pay | Admitting: Internal Medicine

## 2017-07-17 ENCOUNTER — Other Ambulatory Visit: Payer: Self-pay | Admitting: Internal Medicine

## 2017-07-21 ENCOUNTER — Other Ambulatory Visit: Payer: Self-pay | Admitting: Internal Medicine

## 2017-07-22 ENCOUNTER — Emergency Department
Admission: EM | Admit: 2017-07-22 | Discharge: 2017-07-22 | Disposition: A | Discharge status: Home / Self Care | Payer: Medicare Other | Attending: Emergency Medicine | Admitting: Emergency Medicine

## 2017-07-22 ENCOUNTER — Other Ambulatory Visit: Payer: Self-pay

## 2017-07-22 ENCOUNTER — Encounter: Payer: Self-pay | Admitting: Emergency Medicine

## 2017-07-22 DIAGNOSIS — I110 Hypertensive heart disease with heart failure: Secondary | ICD-10-CM | POA: Diagnosis not present

## 2017-07-22 DIAGNOSIS — Y999 Unspecified external cause status: Secondary | ICD-10-CM | POA: Insufficient documentation

## 2017-07-22 DIAGNOSIS — S51811A Laceration without foreign body of right forearm, initial encounter: Secondary | ICD-10-CM | POA: Insufficient documentation

## 2017-07-22 DIAGNOSIS — Z85828 Personal history of other malignant neoplasm of skin: Secondary | ICD-10-CM | POA: Insufficient documentation

## 2017-07-22 DIAGNOSIS — Y92000 Kitchen of unspecified non-institutional (private) residence as  the place of occurrence of the external cause: Secondary | ICD-10-CM | POA: Diagnosis not present

## 2017-07-22 DIAGNOSIS — Y939 Activity, unspecified: Secondary | ICD-10-CM | POA: Insufficient documentation

## 2017-07-22 DIAGNOSIS — S41111A Laceration without foreign body of right upper arm, initial encounter: Secondary | ICD-10-CM | POA: Diagnosis not present

## 2017-07-22 DIAGNOSIS — Z955 Presence of coronary angioplasty implant and graft: Secondary | ICD-10-CM | POA: Diagnosis not present

## 2017-07-22 DIAGNOSIS — Z87891 Personal history of nicotine dependence: Secondary | ICD-10-CM | POA: Diagnosis not present

## 2017-07-22 DIAGNOSIS — Z794 Long term (current) use of insulin: Secondary | ICD-10-CM | POA: Insufficient documentation

## 2017-07-22 DIAGNOSIS — W19XXXA Unspecified fall, initial encounter: Secondary | ICD-10-CM

## 2017-07-22 DIAGNOSIS — Z7901 Long term (current) use of anticoagulants: Secondary | ICD-10-CM | POA: Diagnosis not present

## 2017-07-22 DIAGNOSIS — I2510 Atherosclerotic heart disease of native coronary artery without angina pectoris: Secondary | ICD-10-CM | POA: Insufficient documentation

## 2017-07-22 DIAGNOSIS — I5032 Chronic diastolic (congestive) heart failure: Secondary | ICD-10-CM | POA: Diagnosis not present

## 2017-07-22 DIAGNOSIS — Z79899 Other long term (current) drug therapy: Secondary | ICD-10-CM | POA: Diagnosis not present

## 2017-07-22 DIAGNOSIS — S59911A Unspecified injury of right forearm, initial encounter: Secondary | ICD-10-CM | POA: Diagnosis present

## 2017-07-22 DIAGNOSIS — W1789XA Other fall from one level to another, initial encounter: Secondary | ICD-10-CM | POA: Insufficient documentation

## 2017-07-22 DIAGNOSIS — Y92009 Unspecified place in unspecified non-institutional (private) residence as the place of occurrence of the external cause: Secondary | ICD-10-CM

## 2017-07-22 DIAGNOSIS — T148XXA Other injury of unspecified body region, initial encounter: Secondary | ICD-10-CM

## 2017-07-22 DIAGNOSIS — F419 Anxiety disorder, unspecified: Secondary | ICD-10-CM

## 2017-07-22 MED ORDER — BACITRACIN ZINC 500 UNIT/GM EX OINT
TOPICAL_OINTMENT | Freq: Once | CUTANEOUS | Status: AC
  Administered 2017-07-22: 1 via TOPICAL
  Filled 2017-07-22: qty 0.9

## 2017-07-22 MED ORDER — ALPRAZOLAM 0.25 MG PO TABS: 0.2500 mg | ORAL_TABLET | Freq: Every evening | ORAL | 0 refills | Status: DC | PRN

## 2017-07-22 NOTE — ED Triage Notes (Signed)
Pt states his left knee gave out while washing the dishes, fell over wife's walker, tearing multiple places on his skin to left arm, sites wrapped by EMS.  Pt denies pain other than the skin tears and minor pain to left hip. Pt sitting in wheelchair and states no pain at this time to left hip.

## 2017-07-22 NOTE — ED Provider Notes (Signed)
[facility] Emergency Department Provider Note ____________________________________________  Time seen: 1138  I have reviewed the triage vital signs and the nursing notes.  HISTORY  Chief Complaint  Fall and Abrasion (skin tears)  HPI [name]. is a 84 y.o. male presents to the ED accompanied by his his son, for evaluation of injury sustained following a mechanical fall at home.  Patient reports being at home washing dishes with his walker, when he apparently tripped and fell.  He landed causing multiple skin tears to his left upper arm and his right chest.  He denies any head injury, loss of consciousness, or weakness.  He believes his knee gave out which is the reason for the fall.  He denies any knee or hip pain at the time of the evaluation.  No other complaints are reported.  Past Medical History:  Diagnosis Date  . Arthritis   . CAD    a. MI 01/29/1996 tx'd w/ TPA @ [facility]; b. Myoview 06/2005: EF 50%, scar @ apex, mild peri-infarct ischemia  . Cancer ([place])    skin  . Chronic atrial fibrillation ([place])    a. since 2006; b. on warfarin  . Chronic diastolic CHF (congestive heart failure) ([place])    a. echo 04/2006: EF lower limits of nl, mod LVH, mild aortic root dilatation, & mild MR, biatrial enlargement; b. echo 04/2013: EF 60%, mod dilated LA, mild MR & TR, mod pulm HTN w/ RV systolic pressure 53, c. echo 04/21/14: EF 55-60%, unable to exclude WMA, severely dilated LA 6.6 cm, nl RVSP, mildly dilated aortic root  . COPD (chronic obstructive pulmonary disease) (HCC)    oxygen prn at home  . CVA 7026,3785   x2  . DM   . Falls   . GERD (gastroesophageal reflux disease)   . History of kidney stones   . HYPERLIPIDEMIA   . HYPERTENSION   . Kidney stone    a. s/p left ureteral stenting 04/24/14  . Neuropathy of both feet   . Poor balance     Patient Active Problem List   Diagnosis Date Noted  . Leg ulcer, left, limited to breakdown of skin ([place])  05/12/2017  . Acquired hypothyroidism 11/22/2016  . Osteoarthritis, knee 11/22/2016  . Diabetes type 2, controlled ([place]) 06/13/2015  . BPH (benign prostatic hyperplasia) 05/14/2014  . Anxiety 05/14/2014  . Chronic obstructive pulmonary disease ([place]) 05/14/2014  . Chronic diastolic CHF (congestive heart failure) ([place])   . Pedal edema 01/09/2014  . Insomnia 08/07/2013  . Hyperlipidemia 02/26/2009  . MITRAL REGURGITATION 02/26/2009  . Essential hypertension 02/26/2009  . MI 02/26/2009  . Coronary artery disease of native artery of native heart with stable angina pectoris ([place]) 02/26/2009  . Chronic atrial fibrillation ([place]) 02/26/2009  . CVA (cerebral vascular accident) ([place]) 02/26/2009    Past Surgical History:  Procedure Laterality Date  . BLADDER SURGERY     stent placement   . CARDIAC CATHETERIZATION  1997   [facility]  . CAROTID STENT INSERTION  1997  . CIRCUMCISION  2016  . CORONARY ANGIOPLASTY  1997   s/p stent placement x 2   . CYSTOSCOPY W/ URETERAL STENT REMOVAL Left 10/09/2014   Procedure: CYSTOSCOPY WITH STENT REMOVAL;  Surgeon: Hollice Espy, MD;  Location: [facility] ORS;  Service: Urology;  Laterality: Left;  . CYSTOSCOPY WITH STENT PLACEMENT Left 10/09/2014   Procedure: CYSTOSCOPY WITH STENT PLACEMENT;  Surgeon: Hollice Espy, MD;  Location: [facility] ORS;  Service: Urology;  Laterality: Left;  .  KIDNEY SURGERY  05/2013   s/p stent placement   . stents ureters Bilateral   . TONSILLECTOMY AND ADENOIDECTOMY  1959  . URETEROSCOPY WITH HOLMIUM LASER LITHOTRIPSY Left 10/09/2014   Procedure: URETEROSCOPY WITH HOLMIUM LASER LITHOTRIPSY;  Surgeon: Hollice Espy, MD;  Location: [facility] ORS;  Service: Urology;  Laterality: Left;    Prior to Admission medications   Medication Sig Start Date End Date Taking? Authorizing Provider  acetaminophen (TYLENOL) 650 MG CR tablet Take 650 mg by mouth every 8 (eight) hours as needed.      [provider]  albuterol (2.5 MG/3ML) 0.083% NEBU 3  mL, albuterol (5 MG/ML) 0.5% NEBU 0.5 mL Inhale 1 mg into the lungs.    [provider]  albuterol (PROAIR HFA) 108 (90 Base) MCG/ACT inhaler USE 1 TO 2 INHALATIONS EVERY 4 HOURS AS NEEDED 03/01/17   Wilhelmina Mcardle, MD  ALPRAZolam Duanne Moron) 0.25 MG tablet Take 1 tablet (0.25 mg total) by mouth at bedtime as needed for anxiety. 05/02/17   Jearld Fenton, NP  atorvastatin (LIPITOR) 40 MG tablet TAKE 1 TABLET DAILY 10/15/16   Darylene Price A, FNP  budesonide-formoterol [facility]) 160-4.5 MCG/ACT inhaler Inhale 2 puffs into the lungs 2 (two) times daily. 10/20/15   Wilhelmina Mcardle, MD  citalopram (CELEXA) 10 MG tablet TAKE 1 TABLET DAILY 06/20/17   Jearld Fenton, NP  Diclofenac Sodium 1.5 % SOLN Place 2 mLs onto the skin 4 (four) times daily. 07/15/16   Copland, Frederico Hamman, MD  dorzolamide (TRUSOPT) 2 % ophthalmic solution Place 1 drop into both eyes 3 (three) times daily.  03/31/16   [provider]  ELIQUIS 5 MG TABS tablet TAKE 1 TABLET TWICE A DAY 09/20/16   Minna Merritts, MD  ezetimibe (ZETIA) 10 MG tablet TAKE 1 TABLET DAILY 07/18/17   Jearld Fenton, NP  finasteride (PROSCAR) 5 MG tablet TAKE 1 TABLET DAILY 06/29/17   Jearld Fenton, NP  gabapentin (NEURONTIN) 100 MG capsule TAKE 1 CAPSULE THREE TIMES A DAY 04/15/17   Jearld Fenton, NP  glipiZIDE (GLUCOTROL) 5 MG tablet TAKE 1 TABLET TWICE A DAY BEFORE MEALS 05/02/17   Baity, Coralie Keens, NP  HUMALOG KWIKPEN 100 UNIT/ML KiwkPen INJECT 18 UNITS WITH BREAKFAST, 20 UNITS WITH LUNCH, AND 16 UNITS WITH DINNER 07/22/17   Jearld Fenton, NP  HYDROcodone-acetaminophen (NORCO/VICODIN) 5-325 MG tablet Take 1 tablet by mouth every 8 (eight) hours as needed for moderate pain. 04/18/17   Jearld Fenton, NP  Insulin Pen Needle (BD PEN NEEDLE NANO U/F) 32G X 4 MM MISC USE THREE TIMES A DAY FOR INSULIN ADMINISTRATION 02/14/17   Jearld Fenton, NP  Insulin Syringe-Needle U-100 30G X 1/2" 1 ML MISC 1 each by Does not apply route 3 (three) times daily.  07/19/14   Jearld Fenton, NP  latanoprost (XALATAN) 0.005 % ophthalmic solution Place 1 drop into both eyes at bedtime.  04/18/16   [provider]  LEVEMIR FLEXTOUCH 100 UNIT/ML Pen INJECT 42 UNITS UNDER THE SKIN DAILY AT 10 P.M. 03/07/17   Jearld Fenton, NP  levothyroxine (SYNTHROID, LEVOTHROID) 50 MCG tablet Take 1 tablet (50 mcg total) by mouth daily before breakfast. 12/24/16   Lucille Passy, MD  levothyroxine (SYNTHROID, LEVOTHROID) 50 MCG tablet TAKE 1 TABLET DAILY BEFORE BREAKFAST 07/01/17   Jearld Fenton, NP  metolazone (ZAROXOLYN) 5 MG tablet TAKE 1 TABLET DAILY AS NEEDED 06/14/17   Minna Merritts, MD  metoprolol  succinate (TOPROL-XL) 50 MG 24 hr tablet TAKE 1 TABLET TWICE A DAY 07/12/17   Jearld Fenton, NP  nitroGLYCERIN (NITROSTAT) 0.4 MG SL tablet Place 0.4 mg under the tongue every 5 (five) minutes as needed.      [provider]  nystatin-triamcinolone (MYCOLOG II) cream Apply 1 application topically 2 (two) times daily.  03/12/14   [provider]  Potassium Chloride ER 20 MEQ TBCR Take 40 mEq by mouth 2 (two) times daily. 10/11/16   Hillary Bow, MD  potassium chloride SA (K-DUR,KLOR-CON) 20 MEQ tablet TAKE 3 TABLETS DAILY 06/27/17   Jearld Fenton, NP  predniSONE (DELTASONE) 10 MG tablet TAKE 1 TABLET DAILY WITH BREAKFAST 05/02/17   Jearld Fenton, NP  sitaGLIPtin (JANUVIA) 100 MG tablet Take 1 tablet (100 mg total) by mouth daily. 01/24/17   Jearld Fenton, NP  tamsulosin (FLOMAX) 0.4 MG CAPS capsule TAKE 1 CAPSULE DAILY 04/21/17   Jearld Fenton, NP  tiotropium (SPIRIVA HANDIHALER) 18 MCG inhalation capsule Place 1 capsule (18 mcg total) daily into inhaler and inhale. 02/14/17   Wilhelmina Mcardle, MD  torsemide (DEMADEX) 20 MG tablet TAKE 2 TABLETS TWICE A DAY 12/17/16   Minna Merritts, MD    Allergies Contrast media [iodinated diagnostic agents]; Morphine and related; and Niacin and related  Family History  Problem Relation Age of Onset  . Heart  disease Mother   . Heart disease Maternal Grandmother   . Diabetes Maternal Grandmother   . Cancer Neg Hx   . Stroke Neg Hx     Social History Social History   Tobacco Use  . Smoking status: Former Smoker    Packs/day: 2.00    Years: 40.00    Pack years: 80.00    Types: Cigarettes    Last attempt to quit: 05/24/1990    Years since quitting: 27.1  . Smokeless tobacco: Former Systems developer    Quit date: 05/24/1990  Substance Use Topics  . Alcohol use: No  . Drug use: No    Review of Systems  Constitutional: Negative for fever. Eyes: Negative for visual changes. ENT: Negative for sore throat. Cardiovascular: Negative for chest pain. Respiratory: Negative for shortness of breath. Gastrointestinal: Negative for abdominal pain, vomiting and diarrhea. Musculoskeletal: Negative for back pain. Skin: Negative for rash.  Skin tears as above Neurological: Negative for headaches, focal weakness or numbness. ____________________________________________  PHYSICAL EXAM:  VITAL SIGNS: ED Triage Vitals  Enc Vitals Group     BP 07/22/17 1057 96/70     Pulse Rate 07/22/17 1057 80     Resp 07/22/17 1057 18     Temp 07/22/17 1057 98.5 F (36.9 C)     Temp Source 07/22/17 1057 Oral     SpO2 07/22/17 1057 97 %     Weight 07/22/17 1054 177 lb (80.3 kg)     Height 07/22/17 1054 5\' 6"  (1.676 m)     Head Circumference --      Peak Flow --      Pain Score 07/22/17 1054 6     Pain Loc --      Pain Edu? --      Excl. in [place]? --     Constitutional: Alert and oriented. Well appearing and in no distress. Head: Normocephalic and atraumatic. Eyes: Conjunctivae are normal. Normal extraocular movements Neck: Supple. No thyromegaly. Cardiovascular: Normal rate, regular rhythm. Normal distal pulses. Respiratory: Normal respiratory effort. No wheezes/rales/rhonchi. Musculoskeletal: Nontender with normal range of motion in  all extremities.  Neurologic: Normal speech and language. No gross focal  neurologic deficits are appreciated. Skin:  Skin is warm, dry and intact. No rash noted. RUE with multiple skin tears to the lateral arm.  ____________________________________________  PROCEDURES  .Marland KitchenLaceration Repair Date/Time: 07/22/2017 1:51 PM Performed by: Melvenia Needles, PA-C Authorized by: Melvenia Needles, PA-C   Consent:    Consent obtained:  Verbal   Consent given by:  Patient   Risks discussed:  Poor cosmetic result Anesthesia (see MAR for exact dosages):    Anesthesia method:  None Laceration details:    Location:  Shoulder/arm   Shoulder/arm location:  R lower arm   Wound length (cm): 8 cm & 6 cm & 4 cm. Repair type:    Repair type:  Simple Treatment:    Area cleansed with:  Saline Skin repair:    Repair method:  Tissue adhesive Approximation:    Approximation:  Close Post-procedure details:    Dressing:  Non-adherent dressing   Patient tolerance of procedure:  Tolerated well, no immediate complications  ___________________________________________  INITIAL IMPRESSION / ASSESSMENT AND PLAN / ED COURSE  Presents to the ED for evaluation of multiple skin tears to the right upper extremity.  Patient's wounds are cleansed and well approximated using wound adhesive.  He is discharged with wound care instructions and will follow with primary provider or return to the ED as needed. ____________________________________________  FINAL CLINICAL IMPRESSION(S) / ED DIAGNOSES  Final diagnoses:  Fall in home, initial encounter  Multiple skin tears      Carmie End, Dannielle Karvonen, PA-C 07/22/17 1354    Harvest Dark, MD 07/22/17 1458

## 2017-07-22 NOTE — Telephone Encounter (Signed)
Last filled for 05/04/17... Note to pharmacy to be filled on or after 07/31/2017 to allow shipping and delivery to pt... Please advise

## 2017-07-22 NOTE — ED Notes (Addendum)
First Nurse Note: Pt here via EMS with complaints that left knee gave out while standing in kitchen, pt fell up against wifes walker, multiple skin tears noted, wraps intact placed by EMS. Positive hx for MI, CVA, afib.  Pt appears in no distress at this time, family with pt.

## 2017-07-22 NOTE — ED Notes (Signed)
All wounds dressed with nonadhesive dressing.

## 2017-07-22 NOTE — Discharge Instructions (Signed)
Keep the wounds clean, dry, and covered. Follow-up with your provider as needed for wound checks.

## 2017-07-23 ENCOUNTER — Other Ambulatory Visit: Payer: Self-pay | Admitting: Internal Medicine

## 2017-07-31 ENCOUNTER — Other Ambulatory Visit: Payer: Self-pay | Admitting: Internal Medicine

## 2017-08-02 ENCOUNTER — Encounter: Payer: Self-pay | Admitting: Internal Medicine

## 2017-08-02 ENCOUNTER — Ambulatory Visit (INDEPENDENT_AMBULATORY_CARE_PROVIDER_SITE_OTHER): Payer: Medicare Other | Admitting: Internal Medicine

## 2017-08-02 VITALS — BP 90/50 | HR 80 | Temp 97.6°F | Wt 183.0 lb

## 2017-08-02 DIAGNOSIS — R269 Unspecified abnormalities of gait and mobility: Secondary | ICD-10-CM

## 2017-08-02 DIAGNOSIS — Y92009 Unspecified place in unspecified non-institutional (private) residence as the place of occurrence of the external cause: Secondary | ICD-10-CM

## 2017-08-02 DIAGNOSIS — T148XXA Other injury of unspecified body region, initial encounter: Secondary | ICD-10-CM

## 2017-08-02 DIAGNOSIS — W19XXXD Unspecified fall, subsequent encounter: Secondary | ICD-10-CM

## 2017-08-02 DIAGNOSIS — R2689 Other abnormalities of gait and mobility: Secondary | ICD-10-CM | POA: Diagnosis not present

## 2017-08-07 ENCOUNTER — Encounter: Payer: Self-pay | Admitting: Internal Medicine

## 2017-08-07 NOTE — Progress Notes (Signed)
Subjective:    Patient ID: [name]., male    DOB: Feb 27, 1932, 84 y.o.   MRN: [id number]  HPI  Pt presents to the clinic today for ER follow up. He went to the ER 07/22/2017 s/p fall. He had an abrasion to his right chest and multiple skin tears to the left arm. They were able to cleanse his wounds, and dermabond his skin tears. He reports the skin tears seem to be healing well. He denies recurrent pain, bleeding, redness or swelling. He has had issues in the past with multiple falls. He reports he has only had 2 falls in the last year. He reports his balance is not great, and he has trouble with position changes. He has not had PT in a while and would like a referral for home PT.  Review of Systems      Past Medical History:  Diagnosis Date  . Arthritis   . CAD    a. MI 01/29/1996 tx'd w/ TPA @ [facility]; b. Myoview 06/2005: EF 50%, scar @ apex, mild peri-infarct ischemia  . Cancer ([place])    skin  . Chronic atrial fibrillation ([place])    a. since 2006; b. on warfarin  . Chronic diastolic CHF (congestive heart failure) ([place])    a. echo 04/2006: EF lower limits of nl, mod LVH, mild aortic root dilatation, & mild MR, biatrial enlargement; b. echo 04/2013: EF 60%, mod dilated LA, mild MR & TR, mod pulm HTN w/ RV systolic pressure 53, c. echo 04/21/14: EF 55-60%, unable to exclude WMA, severely dilated LA 6.6 cm, nl RVSP, mildly dilated aortic root  . COPD (chronic obstructive pulmonary disease) (HCC)    oxygen prn at home  . CVA 4944,9675   x2  . DM   . Falls   . GERD (gastroesophageal reflux disease)   . History of kidney stones   . HYPERLIPIDEMIA   . HYPERTENSION   . Kidney stone    a. s/p left ureteral stenting 04/24/14  . Neuropathy of both feet   . Poor balance     Current Outpatient Medications  Medication Sig Dispense Refill  . acetaminophen (TYLENOL) 650 MG CR tablet Take 650 mg by mouth every 8 (eight) hours as needed.      Marland Kitchen albuterol (2.5 MG/3ML) 0.083% NEBU 3 mL,  albuterol (5 MG/ML) 0.5% NEBU 0.5 mL Inhale 1 mg into the lungs.    Marland Kitchen albuterol (PROAIR HFA) 108 (90 Base) MCG/ACT inhaler USE 1 TO 2 INHALATIONS EVERY 4 HOURS AS NEEDED 3 Inhaler 2  . ALPRAZolam (XANAX) 0.25 MG tablet Take 1 tablet (0.25 mg total) by mouth at bedtime as needed for anxiety. 90 tablet 0  . atorvastatin (LIPITOR) 40 MG tablet TAKE 1 TABLET DAILY 90 tablet 3  . budesonide-formoterol (SYMBICORT) 160-4.5 MCG/ACT inhaler Inhale 2 puffs into the lungs 2 (two) times daily. 3 Inhaler 3  . citalopram (CELEXA) 10 MG tablet TAKE 1 TABLET DAILY 90 tablet 1  . Diclofenac Sodium 1.5 % SOLN Place 2 mLs onto the skin 4 (four) times daily. 150 mL 5  . dorzolamide (TRUSOPT) 2 % ophthalmic solution Place 1 drop into both eyes 3 (three) times daily.     Marland Kitchen ELIQUIS 5 MG TABS tablet TAKE 1 TABLET TWICE A DAY 180 tablet 3  . ezetimibe (ZETIA) 10 MG tablet TAKE 1 TABLET DAILY 90 tablet 0  . finasteride (PROSCAR) 5 MG tablet TAKE 1 TABLET DAILY 90 tablet 4  . gabapentin (NEURONTIN)  100 MG capsule TAKE 1 CAPSULE THREE TIMES A DAY 270 capsule 1  . glipiZIDE (GLUCOTROL) 5 MG tablet TAKE 1 TABLET TWICE A DAY BEFORE MEALS 180 tablet 0  . HUMALOG KWIKPEN 100 UNIT/ML KiwkPen INJECT 18 UNITS WITH BREAKFAST, 20 UNITS WITH LUNCH, AND 16 UNITS WITH DINNER 60 mL 0  . HYDROcodone-acetaminophen (NORCO/VICODIN) 5-325 MG tablet Take 1 tablet by mouth every 8 (eight) hours as needed for moderate pain. 15 tablet 0  . Insulin Pen Needle (BD PEN NEEDLE NANO U/F) 32G X 4 MM MISC USE THREE TIMES A DAY FOR INSULIN ADMINISTRATION 270 each 1  . Insulin Syringe-Needle U-100 30G X 1/2" 1 ML MISC 1 each by Does not apply route 3 (three) times daily. 200 each 5  . JANUVIA 100 MG tablet TAKE 1 TABLET DAILY 90 tablet 1  . latanoprost (XALATAN) 0.005 % ophthalmic solution Place 1 drop into both eyes at bedtime.     Marland Kitchen LEVEMIR FLEXTOUCH 100 UNIT/ML Pen INJECT 42 UNITS UNDER THE SKIN DAILY AT 10 P.M. 45 mL 1  . levothyroxine (SYNTHROID,  LEVOTHROID) 50 MCG tablet Take 1 tablet (50 mcg total) by mouth daily before breakfast. 90 tablet 1  . levothyroxine (SYNTHROID, LEVOTHROID) 50 MCG tablet TAKE 1 TABLET DAILY BEFORE BREAKFAST 90 tablet 0  . metolazone (ZAROXOLYN) 5 MG tablet TAKE 1 TABLET DAILY AS NEEDED 90 tablet 2  . metoprolol succinate (TOPROL-XL) 50 MG 24 hr tablet TAKE 1 TABLET TWICE A DAY 180 tablet 0  . nitroGLYCERIN (NITROSTAT) 0.4 MG SL tablet Place 0.4 mg under the tongue every 5 (five) minutes as needed.      . nystatin-triamcinolone (MYCOLOG II) cream Apply 1 application topically 2 (two) times daily.     . Potassium Chloride ER 20 MEQ TBCR Take 40 mEq by mouth 2 (two) times daily. 120 tablet 0  . potassium chloride SA (K-DUR,KLOR-CON) 20 MEQ tablet TAKE 3 TABLETS DAILY 270 tablet 0  . predniSONE (DELTASONE) 10 MG tablet TAKE 1 TABLET DAILY WITH BREAKFAST 90 tablet 1  . tamsulosin (FLOMAX) 0.4 MG CAPS capsule TAKE 1 CAPSULE DAILY 90 capsule 1  . tiotropium (SPIRIVA HANDIHALER) 18 MCG inhalation capsule Place 1 capsule (18 mcg total) daily into inhaler and inhale. 90 capsule 3  . torsemide (DEMADEX) 20 MG tablet TAKE 2 TABLETS TWICE A DAY 360 tablet 3   No current facility-administered medications for this visit.     Allergies  Allergen Reactions  . Contrast Media [Iodinated Diagnostic Agents] Shortness Of Breath  . Amlodipine Other (See Comments)  . Morphine And Related Other (See Comments)    Hallucinations   . Niacin And Related Dermatitis  . Other     Other reaction(s): SHORTNESS OF BREATH    Family History  Problem Relation Age of Onset  . Heart disease Mother   . Heart disease Maternal Grandmother   . Diabetes Maternal Grandmother   . Cancer Neg Hx   . Stroke Neg Hx     Social History   Socioeconomic History  . Marital status: Married    Spouse name: Not on file  . Number of children: Not on file  . Years of education: Not on file  . Highest education level: Not on file  Occupational  History  . Not on file  Social Needs  . Financial resource strain: Not on file  . Food insecurity:    Worry: Not on file    Inability: Not on file  . Transportation needs:  Medical: Not on file    Non-medical: Not on file  Tobacco Use  . Smoking status: Former Smoker    Packs/day: 2.00    Years: 40.00    Pack years: 80.00    Types: Cigarettes    Last attempt to quit: 05/24/1990    Years since quitting: 27.2  . Smokeless tobacco: Former Systems developer    Quit date: 05/24/1990  Substance and Sexual Activity  . Alcohol use: No  . Drug use: No  . Sexual activity: Not Currently  Lifestyle  . Physical activity:    Days per week: Not on file    Minutes per session: Not on file  . Stress: Not on file  Relationships  . Social connections:    Talks on phone: Not on file    Gets together: Not on file    Attends religious service: Not on file    Active member of club or organization: Not on file    Attends meetings of clubs or organizations: Not on file    Relationship status: Not on file  . Intimate partner violence:    Fear of current or ex partner: Not on file    Emotionally abused: Not on file    Physically abused: Not on file    Forced sexual activity: Not on file  Other Topics Concern  . Not on file  Social History Narrative  . Not on file     Constitutional: Denies fever, malaise, fatigue, headache or abrupt weight changes.  Musculoskeletal: Pt reports difficulty with gait. Denies decrease in range of motion, muscle pain.  Skin: Pt reports abrasion to right chest and skin tears to left arm.  Neurological: Pt reports difficulty with balance. Denies dizziness, difficulty with memory, difficulty with speech or problems with coordination.    No other specific complaints in a complete review of systems (except as listed in HPI above).  Objective:   Physical Exam   BP (!) 90/50 (BP Location: Right Arm, Patient Position: Sitting, Cuff Size: Normal)   Pulse 80   Temp 97.6 F  (36.4 C) (Oral)   Wt 183 lb (83 kg)   SpO2 97%   BMI 29.54 kg/m  Wt Readings from Last 3 Encounters:  08/02/17 183 lb (83 kg)  07/22/17 177 lb (80.3 kg)  07/07/17 181 lb (82.1 kg)    General: Appears his stated age, chronically ill appearing, in NAD. Skin: Multiple skin teafs to left arm, dermabond intact. Abrasion to right upper chest scabbed, no redness noted. Musculoskeletal: He has obvious trouble going from a sitting to a standing position. Gait slow, slightly unsteady even with use of walker. Neurological: Alert and oriented.    BMET    Component Value Date/Time   NA 138 11/22/2016 1019   NA 143 02/21/2015 1108   NA 137 08/05/2014 1839   K 3.5 11/22/2016 1019   K 3.6 08/05/2014 1839   CL 95 (L) 11/22/2016 1019   CL 106 08/05/2014 1839   CO2 34 (H) 11/22/2016 1019   CO2 23 08/05/2014 1839   GLUCOSE 212 (H) 11/22/2016 1019   GLUCOSE 235 (H) 08/05/2014 1839   BUN 34 (H) 11/22/2016 1019   BUN 26 02/21/2015 1108   BUN 19 08/05/2014 1839   CREATININE 1.54 (H) 11/22/2016 1019   CREATININE 1.20 08/05/2014 1839   CALCIUM 9.3 11/22/2016 1019   CALCIUM 8.6 (L) 08/05/2014 1839   GFRNONAA 46 (L) 10/11/2016 0458   GFRNONAA 57 (L) 08/05/2014 1839   GFRAA 54 (L)  10/11/2016 0458   GFRAA >60 08/05/2014 1839    Lipid Panel     Component Value Date/Time   CHOL 104 11/22/2016 1019   TRIG 154.0 (H) 11/22/2016 1019   HDL 43.80 11/22/2016 1019   CHOLHDL 2 11/22/2016 1019   VLDL 30.8 11/22/2016 1019   LDLCALC 29 11/22/2016 1019    CBC    Component Value Date/Time   WBC 16.5 (H) 04/11/2017 1023   RBC 5.17 04/11/2017 1023   HGB 15.1 04/11/2017 1023   HGB 13.3 08/05/2014 1839   HCT 44.2 04/11/2017 1023   HCT 40.1 08/05/2014 1839   PLT 198 04/11/2017 1023   PLT 246 08/05/2014 1839   MCV 85.5 04/11/2017 1023   MCV 83 08/05/2014 1839   MCH 29.2 04/11/2017 1023   MCHC 34.1 04/11/2017 1023   RDW 16.1 (H) 04/11/2017 1023   RDW 16.3 (H) 08/05/2014 1839   LYMPHSABS 3.3  05/20/2016 1041   LYMPHSABS 2.3 08/05/2014 1839   MONOABS 1.7 (H) 05/20/2016 1041   MONOABS 2.0 (H) 08/05/2014 1839   EOSABS 0.2 05/20/2016 1041   EOSABS 0.1 08/05/2014 1839   BASOSABS 0.1 05/20/2016 1041   BASOSABS 0.1 08/05/2014 1839    Hgb A1C Lab Results  Component Value Date   HGBA1C 6.7 (H) 02/15/2017           Assessment & Plan:   ER Follow Up s/p Fall, Skin Tear:  ER notes, labs reviewed No indication of continued bleeding or infection No medication changes at this time Will request home PT for balance issues  RTC in 1 month to follow up chronic conditions. Webb Silversmith, NP

## 2017-08-07 NOTE — Patient Instructions (Signed)
Skin Tear Care A skin tear is a wound in which the top layer of skin peels off. To repair the skin, your doctor may use:  Tape.  Skin adhesive strips.  A bandage (dressing) may also be placed over the tape or skin adhesive strips. How to care for your skin tear  Clean the wound as told by your doctor. You may be told to keep the wound dry for the first few days. If you are told to clean the wound: ? Wash the wound with mild soap and water or a salt-water (saline) solution. ? Rinse the wound with water to remove all soap. ? Do not rub the wound dry. Let the wound air dry.  Change any dressings as told by your doctor. This includes changing the dressing if it gets wet, gets dirty, or starts to smell bad.  Do not scratch or pick at the wound.  Protect the injured area until it has healed.  Check your wound every day for signs of infection. Check for: ? More redness, swelling, or pain. ? More fluid or blood. ? Warmth. ? Pus or a bad smell. Medicines   Take over-the-counter and prescription medicines only as told by your doctor.  If you were prescribed an antibiotic medicine, take or apply it as told by your doctor. Do not stop using the antibiotic even if your condition gets better. General instructions  Keep the bandage dry as told by your doctor.  Do not take baths, swim, or do anything that puts your wound underwater until your doctor says it is okay.  Keep all follow-up visits as told by your doctor. This is important. Contact a doctor if:  You have more redness, swelling, or pain around your wound.  You have more fluid or blood coming from your wound.  Your wound feels warm to the touch.  You have pus or a bad smell coming from your wound. Get help right away if:  You have a red streak that goes away from the skin tear.  You have a fever and chills and your symptoms suddenly get worse. This information is not intended to replace advice given to you by your health  care provider. Make sure you discuss any questions you have with your health care provider. Document Released: 01/06/2008 Document Revised: 11/23/2015 Document Reviewed: 02/17/2015 Elsevier Interactive Patient Education  [employer].

## 2017-08-09 LAB — HM DIABETES EYE EXAM

## 2017-08-16 ENCOUNTER — Other Ambulatory Visit: Payer: Self-pay | Admitting: Internal Medicine

## 2017-08-31 ENCOUNTER — Other Ambulatory Visit: Payer: Self-pay | Admitting: *Deleted

## 2017-08-31 ENCOUNTER — Other Ambulatory Visit: Payer: Self-pay | Admitting: Internal Medicine

## 2017-08-31 DIAGNOSIS — Z7982 Long term (current) use of aspirin: Secondary | ICD-10-CM | POA: Diagnosis not present

## 2017-08-31 DIAGNOSIS — Z7901 Long term (current) use of anticoagulants: Secondary | ICD-10-CM | POA: Diagnosis not present

## 2017-08-31 DIAGNOSIS — Z7952 Long term (current) use of systemic steroids: Secondary | ICD-10-CM | POA: Diagnosis not present

## 2017-08-31 DIAGNOSIS — W19XXXD Unspecified fall, subsequent encounter: Secondary | ICD-10-CM | POA: Diagnosis not present

## 2017-08-31 DIAGNOSIS — R260 Ataxic gait: Secondary | ICD-10-CM | POA: Diagnosis not present

## 2017-08-31 DIAGNOSIS — E119 Type 2 diabetes mellitus without complications: Secondary | ICD-10-CM | POA: Diagnosis not present

## 2017-08-31 DIAGNOSIS — Z7951 Long term (current) use of inhaled steroids: Secondary | ICD-10-CM | POA: Diagnosis not present

## 2017-08-31 DIAGNOSIS — Z9181 History of falling: Secondary | ICD-10-CM | POA: Diagnosis not present

## 2017-08-31 DIAGNOSIS — M6281 Muscle weakness (generalized): Secondary | ICD-10-CM | POA: Diagnosis not present

## 2017-08-31 DIAGNOSIS — Z9981 Dependence on supplemental oxygen: Secondary | ICD-10-CM | POA: Diagnosis not present

## 2017-08-31 DIAGNOSIS — R0602 Shortness of breath: Secondary | ICD-10-CM | POA: Diagnosis not present

## 2017-08-31 DIAGNOSIS — Z794 Long term (current) use of insulin: Secondary | ICD-10-CM | POA: Diagnosis not present

## 2017-09-01 ENCOUNTER — Encounter: Payer: Self-pay | Admitting: Internal Medicine

## 2017-09-01 ENCOUNTER — Ambulatory Visit (INDEPENDENT_AMBULATORY_CARE_PROVIDER_SITE_OTHER): Payer: Medicare Other | Admitting: Internal Medicine

## 2017-09-01 VITALS — BP 102/66 | HR 74 | Temp 98.0°F | Wt 180.0 lb

## 2017-09-01 DIAGNOSIS — R05 Cough: Secondary | ICD-10-CM

## 2017-09-01 DIAGNOSIS — R0982 Postnasal drip: Secondary | ICD-10-CM | POA: Diagnosis not present

## 2017-09-01 DIAGNOSIS — R059 Cough, unspecified: Secondary | ICD-10-CM

## 2017-09-01 MED ORDER — HYDROCOD POLST-CPM POLST ER 10-8 MG/5ML PO SUER: 5.0000 mL | Freq: Every evening | ORAL | 0 refills | Status: DC | PRN

## 2017-09-01 MED ORDER — MONTELUKAST SODIUM 10 MG PO TABS: 10.0000 mg | ORAL_TABLET | Freq: Every day | ORAL | 3 refills | Status: DC

## 2017-09-01 NOTE — Patient Instructions (Signed)

## 2017-09-01 NOTE — Progress Notes (Signed)
HPI  Pt presents to the clinic today with c/o cough. He reports this started 2-3 days ago. The cough is productive of white mucous. He denies runny nose, nasal congestion, ear pain or sore throat. He is mildly short of breath but this is his baseline. He denies fever, chills or body aches. He has a history of COPD, CHF and Lung CA. He has not tried any OTC medications, only his RX inhalers. He has not had sick contacts.  Review of Systems      Past Medical History:  Diagnosis Date  . Arthritis   . CAD    a. MI 01/29/1996 tx'd w/ TPA @ [facility]; b. Myoview 06/2005: EF 50%, scar @ apex, mild peri-infarct ischemia  . Cancer ([place])    skin  . Chronic atrial fibrillation ([place])    a. since 2006; b. on warfarin  . Chronic diastolic CHF (congestive heart failure) ([place])    a. echo 04/2006: EF lower limits of nl, mod LVH, mild aortic root dilatation, & mild MR, biatrial enlargement; b. echo 04/2013: EF 60%, mod dilated LA, mild MR & TR, mod pulm HTN w/ RV systolic pressure 53, c. echo 04/21/14: EF 55-60%, unable to exclude WMA, severely dilated LA 6.6 cm, nl RVSP, mildly dilated aortic root  . COPD (chronic obstructive pulmonary disease) (HCC)    oxygen prn at home  . CVA 4098,1191   x2  . DM   . Falls   . GERD (gastroesophageal reflux disease)   . History of kidney stones   . HYPERLIPIDEMIA   . HYPERTENSION   . Kidney stone    a. s/p left ureteral stenting 04/24/14  . Neuropathy of both feet   . Poor balance     Family History  Problem Relation Age of Onset  . Heart disease Mother   . Heart disease Maternal Grandmother   . Diabetes Maternal Grandmother   . Cancer Neg Hx   . Stroke Neg Hx     Social History   Socioeconomic History  . Marital status: Married    Spouse name: Not on file  . Number of children: Not on file  . Years of education: Not on file  . Highest education level: Not on file  Occupational History  . Not on file  Social Needs  . Financial resource strain: Not on  file  . Food insecurity:    Worry: Not on file    Inability: Not on file  . Transportation needs:    Medical: Not on file    Non-medical: Not on file  Tobacco Use  . Smoking status: Former Smoker    Packs/day: 2.00    Years: 40.00    Pack years: 80.00    Types: Cigarettes    Last attempt to quit: 05/24/1990    Years since quitting: 27.2  . Smokeless tobacco: Former Systems developer    Quit date: 05/24/1990  Substance and Sexual Activity  . Alcohol use: No  . Drug use: No  . Sexual activity: Not Currently  Lifestyle  . Physical activity:    Days per week: Not on file    Minutes per session: Not on file  . Stress: Not on file  Relationships  . Social connections:    Talks on phone: Not on file    Gets together: Not on file    Attends religious service: Not on file    Active member of club or organization: Not on file    Attends meetings of clubs or  organizations: Not on file    Relationship status: Not on file  . Intimate partner violence:    Fear of current or ex partner: Not on file    Emotionally abused: Not on file    Physically abused: Not on file    Forced sexual activity: Not on file  Other Topics Concern  . Not on file  Social History Narrative  . Not on file    Allergies  Allergen Reactions  . Contrast Media [Iodinated Diagnostic Agents] Shortness Of Breath  . Amlodipine Other (See Comments)  . Morphine And Related Other (See Comments)    Hallucinations   . Niacin And Related Dermatitis  . Other     Other reaction(s): SHORTNESS OF BREATH     Constitutional: Denies headache, fatigue, fever or abrupt weight changes.  HEENT:   Denies eye redness, eye pain, pressure behind the eyes, facial pain, nasal congestion, ear pain, ringing in the ears, wax buildup, runny nose or sore throat. Respiratory: Positive cough and shortness of breath. Denies difficulty breathing.  Cardiovascular: Denies chest pain, chest tightness, palpitations or swelling in the hands or feet.    No other specific complaints in a complete review of systems (except as listed in HPI above).  Objective:   BP 102/66   Pulse 74   Temp 98 F (36.7 C) (Oral)   Wt 180 lb (81.6 kg)   SpO2 96%   BMI 29.05 kg/m  Wt Readings from Last 3 Encounters:  09/01/17 180 lb (81.6 kg)  08/02/17 183 lb (83 kg)  07/22/17 177 lb (80.3 kg)     General: Appears his stated age, chronically ill appearing, in NAD. HEENT: Head: normal shape and size; Throat/Mouth: + PND. Teeth present, mucosa erythematous and moist, no exudate noted, no lesions or ulcerations noted.  Neck: No cervical lymphadenopathy.  Cardiovascular: Normal rate with irregular rhythm. S1,S2 noted.  No murmur, rubs or gallops noted. 1+ non pitting BLE (not wearing TED's today) Pulmonary/Chest: Normal effort and positive vesicular breath sounds. No respiratory distress. No wheezes, rales or ronchi noted.       Assessment & Plan:   Cough secondary to PND:  Get some rest and drink plenty of water eRx for Singulair 10 mg daily eRx for Tussionex cough syrup  RTC as needed or if symptoms persist.   Webb Silversmith, NP

## 2017-09-14 ENCOUNTER — Telehealth: Payer: Self-pay | Admitting: Internal Medicine

## 2017-09-14 NOTE — Telephone Encounter (Signed)
Copied from [place] [id number]. Topic: Quick Communication - See Telephone Encounter >> Sep 14, 2017  1:28 PM Bea Graff, NT wrote: CRM for notification. See Telephone encounter for: 09/14/17. Elta Guadeloupe with [facility] calling and states patient reports he had a fall Friday night but that the is ok. CB#: [phone]

## 2017-09-14 NOTE — Telephone Encounter (Signed)
noted 

## 2017-09-18 ENCOUNTER — Other Ambulatory Visit: Payer: Self-pay | Admitting: Cardiovascular Disease

## 2017-09-19 ENCOUNTER — Ambulatory Visit
Admission: RE | Admit: 2017-09-19 | Discharge: 2017-09-19 | Disposition: A | Discharge status: Home / Self Care | Payer: Medicare Other | Source: Ambulatory Visit | Attending: Radiation Oncology | Admitting: Radiation Oncology

## 2017-09-19 DIAGNOSIS — I700 Atherosclerosis of aorta: Secondary | ICD-10-CM | POA: Diagnosis not present

## 2017-09-19 DIAGNOSIS — J849 Interstitial pulmonary disease, unspecified: Secondary | ICD-10-CM | POA: Insufficient documentation

## 2017-09-19 DIAGNOSIS — C3432 Malignant neoplasm of lower lobe, left bronchus or lung: Secondary | ICD-10-CM | POA: Insufficient documentation

## 2017-09-19 DIAGNOSIS — I2510 Atherosclerotic heart disease of native coronary artery without angina pectoris: Secondary | ICD-10-CM | POA: Insufficient documentation

## 2017-09-19 DIAGNOSIS — J439 Emphysema, unspecified: Secondary | ICD-10-CM | POA: Diagnosis not present

## 2017-09-19 NOTE — Telephone Encounter (Signed)
Please review for refill, Thanks !  

## 2017-09-20 ENCOUNTER — Other Ambulatory Visit: Payer: Self-pay | Admitting: Internal Medicine

## 2017-09-20 DIAGNOSIS — I5032 Chronic diastolic (congestive) heart failure: Secondary | ICD-10-CM

## 2017-09-20 NOTE — Telephone Encounter (Signed)
I do not see where you have ever filled this, also there is a different sig on med in chart... Please advise

## 2017-09-20 NOTE — Telephone Encounter (Signed)
Can you call and clarify how he has been taking? 40 meq BID or 60 meq daily.

## 2017-09-22 ENCOUNTER — Other Ambulatory Visit: Payer: Self-pay | Admitting: Internal Medicine

## 2017-09-23 NOTE — Telephone Encounter (Signed)
Pt states he has been taking 40mg  BID... Per [facility] VO pt will come in next week for lab only appt to check potassium before refilling medication--- appt scheduled and lab future ordered

## 2017-09-26 ENCOUNTER — Telehealth: Payer: Self-pay

## 2017-09-26 NOTE — Telephone Encounter (Signed)
[facility] for skilled nursing.

## 2017-09-26 NOTE — Telephone Encounter (Signed)
Copied from [place] [id number]. Topic: Quick Communication - See Telephone Encounter >> Sep 26, 2017  3:00 PM Hewitt Shorts wrote: Suezanne Jacquet from [facility] is needing verbal orders for skilled nursing evaluation for fluid in lower legs    Best number for [place] [phone]

## 2017-09-27 NOTE — Telephone Encounter (Signed)
Left message on voicemail.

## 2017-09-28 ENCOUNTER — Other Ambulatory Visit: Payer: Self-pay | Admitting: *Deleted

## 2017-09-28 ENCOUNTER — Other Ambulatory Visit: Payer: Self-pay

## 2017-09-28 ENCOUNTER — Ambulatory Visit
Admission: RE | Admit: 2017-09-28 | Discharge: 2017-09-28 | Disposition: A | Discharge status: Home / Self Care | Payer: Medicare Other | Source: Ambulatory Visit | Attending: Radiation Oncology | Admitting: Radiation Oncology

## 2017-09-28 ENCOUNTER — Telehealth: Payer: Self-pay | Admitting: Internal Medicine

## 2017-09-28 ENCOUNTER — Encounter: Payer: Self-pay | Admitting: Radiation Oncology

## 2017-09-28 ENCOUNTER — Telehealth: Payer: Self-pay

## 2017-09-28 ENCOUNTER — Other Ambulatory Visit: Payer: Self-pay | Admitting: Internal Medicine

## 2017-09-28 ENCOUNTER — Other Ambulatory Visit (INDEPENDENT_AMBULATORY_CARE_PROVIDER_SITE_OTHER): Payer: Medicare Other

## 2017-09-28 VITALS — BP 96/63 | HR 64 | Temp 97.2°F | Resp 20 | Wt 180.4 lb

## 2017-09-28 DIAGNOSIS — Z923 Personal history of irradiation: Secondary | ICD-10-CM | POA: Diagnosis not present

## 2017-09-28 DIAGNOSIS — C3432 Malignant neoplasm of lower lobe, left bronchus or lung: Secondary | ICD-10-CM | POA: Insufficient documentation

## 2017-09-28 DIAGNOSIS — I5032 Chronic diastolic (congestive) heart failure: Secondary | ICD-10-CM | POA: Diagnosis not present

## 2017-09-28 LAB — BASIC METABOLIC PANEL
BUN: 66 mg/dL — ABNORMAL HIGH (ref 6–23)
CALCIUM: 9.6 mg/dL (ref 8.4–10.5)
CO2: 39 meq/L — AB (ref 19–32)
CREATININE: 2.1 mg/dL — AB (ref 0.40–1.50)
Chloride: 89 mEq/L — ABNORMAL LOW (ref 96–112)
GFR: 32.16 mL/min — AB (ref >60.00)
GLUCOSE: 105 mg/dL — AB (ref 70–99)
Potassium: 2.7 mEq/L — CL (ref 3.5–5.1)
Sodium: 138 mEq/L (ref 135–145)

## 2017-09-28 NOTE — Telephone Encounter (Addendum)
Ben with [facility] notified as instructed and voiced understanding. Suezanne Jacquet is going to call [facility] office now with approval for skilled nursing eval and try to get nurse out to see pt ASAP. I also called Mrs Fowles (DPR signed) to let her know [facility] should be calling to come out and see pt; Mrs Brideau voiced understanding and will let her husband know. Mrs Bayona will cb if needed. FYI to Avie Echevaria NP.

## 2017-09-28 NOTE — Addendum Note (Signed)
Encounter addended by: Noreene Filbert, MD on: 09/28/2017 11:25 AM  Actions taken: LOS modified

## 2017-09-28 NOTE — Telephone Encounter (Signed)
[facility] lab called with critical results. Potassium 2.6 Results given to Webb Silversmith, NP at 3:45pm -Lendon Collar, RTR

## 2017-09-28 NOTE — Telephone Encounter (Signed)
noted 

## 2017-09-28 NOTE — Telephone Encounter (Signed)
Wife following up on referral for nursing.  She states he really needs to have his leg checked and looked at asap. No appts with [facility] to look at the leg, but wife was told the nurse could do it with home health. Hopes they can come soon.

## 2017-09-28 NOTE — Telephone Encounter (Signed)
See result note.  

## 2017-09-28 NOTE — Telephone Encounter (Signed)
Copied from [place] [id number]. Topic: General - Other >> Sep 28, 2017 12:36 PM Yvette Rack wrote: Reason for CRM: Christina with [facility] states pt injured lower left shin and there are signs of cellulitis. Cb# [phone]

## 2017-09-28 NOTE — Progress Notes (Signed)
Radiation Oncology Follow up Note  Name: [name].   Date:   09/28/2017 MRN:  [id number] DOB: 1931/09/04    This 84 y.o. male presents to the clinic today for four-month follow-up status post SB RT to his left lower lobe for stage I adenocarcinoma.  REFERRING PROVIDER: Jearld Fenton, NP  HPI: patient is a 84 year old male now out 4 months having completed SB RT to his left lower lobe for stage I adenocarcinoma seen today in routine follow-up he is doing well has a slight nonproductive cough. Specifically denies hemoptysis. No dysphagia..he recently had a CT scan showing decreased size of left lower lobe pulmonary nodule no evidence of metastatic disease.  COMPLICATIONS OF TREATMENT: none  FOLLOW UP COMPLIANCE: keeps appointments   PHYSICAL EXAM:  BP 96/63   Pulse 64   Temp (!) 97.2 F (36.2 C)   Resp 20   Wt 180 lb 7.1 oz (81.8 kg)   BMI 29.12 kg/m  Well-developed well-nourished patient in NAD. HEENT reveals PERLA, EOMI, discs not visualized.  Oral cavity is clear. No oral mucosal lesions are identified. Neck is clear without evidence of cervical or supraclavicular adenopathy. Lungs are clear to A&P. Cardiac examination is essentially unremarkable with regular rate and rhythm without murmur rub or thrill. Abdomen is benign with no organomegaly or masses noted. Motor sensory and DTR levels are equal and symmetric in the upper and lower extremities. Cranial nerves II through XII are grossly intact. Proprioception is intact. No peripheral adenopathy or edema is identified. No motor or sensory levels are noted. Crude visual fields are within normal range.  RADIOLOGY RESULTS: CT scan is reviewed and compatible with the above-stated findings.  PLAN: present time patient is done well. I'm please was overall progress. I'm please with his CT scan results. I've asked to see him out 6 months for follow-up with a repeat CT scan prior to that visit. If that is continues to be normal will  go ahead with once your follow-up surveillance. Patient knows to call with any concerns.  I would like to take this opportunity to thank you for allowing me to participate in the care of your patient.Noreene Filbert, MD

## 2017-09-28 NOTE — Telephone Encounter (Signed)
Pt has appt for Fri--6/21 at 2pm

## 2017-09-28 NOTE — Telephone Encounter (Signed)
He needs to schedule an appt

## 2017-09-30 ENCOUNTER — Other Ambulatory Visit: Payer: Self-pay | Admitting: Internal Medicine

## 2017-09-30 ENCOUNTER — Encounter: Payer: Self-pay | Admitting: Internal Medicine

## 2017-09-30 ENCOUNTER — Ambulatory Visit (INDEPENDENT_AMBULATORY_CARE_PROVIDER_SITE_OTHER): Payer: Medicare Other | Admitting: Internal Medicine

## 2017-09-30 VITALS — BP 106/74 | HR 64 | Temp 97.7°F | Wt 181.0 lb

## 2017-09-30 DIAGNOSIS — L03116 Cellulitis of left lower limb: Secondary | ICD-10-CM

## 2017-09-30 DIAGNOSIS — R7989 Other specified abnormal findings of blood chemistry: Secondary | ICD-10-CM

## 2017-09-30 MED ORDER — AMOXICILLIN-POT CLAVULANATE 875-125 MG PO TABS: 1.0000 | ORAL_TABLET | Freq: Two times a day (BID) | ORAL | 0 refills | Status: DC

## 2017-09-30 NOTE — Patient Instructions (Signed)

## 2017-09-30 NOTE — Progress Notes (Signed)
Subjective:    Patient ID: [name]., male    DOB: 11-07-1931, 84 y.o.   MRN: [id number]  HPI  Pt presents to the clinic today with c/o skin tear to left shin. He noticed this 1 week ago. He has noticed surrounding redness and drainage from the area. The area is warm and tender to the area. He denies fever, chills or body aches. He has washed the area with soap and warm water. He has tried Neosporin with minimal relief. He has a history of DM 2.  Review of Systems      Past Medical History:  Diagnosis Date  . Arthritis   . CAD    a. MI 01/29/1996 tx'd w/ TPA @ [facility]; b. Myoview 06/2005: EF 50%, scar @ apex, mild peri-infarct ischemia  . Cancer ([place])    skin  . Chronic atrial fibrillation ([place])    a. since 2006; b. on warfarin  . Chronic diastolic CHF (congestive heart failure) ([place])    a. echo 04/2006: EF lower limits of nl, mod LVH, mild aortic root dilatation, & mild MR, biatrial enlargement; b. echo 04/2013: EF 60%, mod dilated LA, mild MR & TR, mod pulm HTN w/ RV systolic pressure 53, c. echo 04/21/14: EF 55-60%, unable to exclude WMA, severely dilated LA 6.6 cm, nl RVSP, mildly dilated aortic root  . COPD (chronic obstructive pulmonary disease) (HCC)    oxygen prn at home  . CVA [id number]   x2  . DM   . Falls   . GERD (gastroesophageal reflux disease)   . History of kidney stones   . HYPERLIPIDEMIA   . HYPERTENSION   . Kidney stone    a. s/p left ureteral stenting 04/24/14  . Neuropathy of both feet   . Poor balance     Current Outpatient Medications  Medication Sig Dispense Refill  . acetaminophen (TYLENOL) 650 MG CR tablet Take 650 mg by mouth every 8 (eight) hours as needed.      Marland Kitchen albuterol (2.5 MG/3ML) 0.083% NEBU 3 mL, albuterol (5 MG/ML) 0.5% NEBU 0.5 mL Inhale 1 mg into the lungs.    Marland Kitchen albuterol (PROAIR HFA) 108 (90 Base) MCG/ACT inhaler USE 1 TO 2 INHALATIONS EVERY 4 HOURS AS NEEDED 3 Inhaler 2  . ALPRAZolam (XANAX) 0.25 MG tablet Take 1 tablet (0.25 mg  total) by mouth at bedtime as needed for anxiety. 90 tablet 0  . atorvastatin (LIPITOR) 40 MG tablet TAKE 1 TABLET DAILY 90 tablet 3  . budesonide-formoterol (SYMBICORT) 160-4.5 MCG/ACT inhaler Inhale 2 puffs into the lungs 2 (two) times daily. 3 Inhaler 3  . chlorpheniramine-HYDROcodone (TUSSIONEX PENNKINETIC ER) 10-8 MG/5ML SUER Take 5 mLs by mouth at bedtime as needed for cough. 140 mL 0  . citalopram (CELEXA) 10 MG tablet TAKE 1 TABLET DAILY 90 tablet 1  . Diclofenac Sodium 1.5 % SOLN Place 2 mLs onto the skin 4 (four) times daily. 150 mL 5  . dorzolamide (TRUSOPT) 2 % ophthalmic solution Place 1 drop into both eyes 3 (three) times daily.     Marland Kitchen ELIQUIS 5 MG TABS tablet TAKE 1 TABLET TWICE A DAY 180 tablet 3  . ezetimibe (ZETIA) 10 MG tablet TAKE 1 TABLET DAILY 90 tablet 0  . finasteride (PROSCAR) 5 MG tablet TAKE 1 TABLET DAILY 90 tablet 4  . gabapentin (NEURONTIN) 100 MG capsule TAKE 1 CAPSULE THREE TIMES A DAY 270 capsule 1  . glipiZIDE (GLUCOTROL) 5 MG tablet TAKE 1 TABLET TWICE  A DAY BEFORE MEALS 180 tablet 0  . HUMALOG KWIKPEN 100 UNIT/ML KiwkPen INJECT 18 UNITS WITH BREAKFAST, 20 UNITS WITH LUNCH, AND 16 UNITS WITH DINNER 60 mL 0  . HYDROcodone-acetaminophen (NORCO/VICODIN) 5-325 MG tablet Take 1 tablet by mouth every 8 (eight) hours as needed for moderate pain. 15 tablet 0  . Insulin Pen Needle (BD PEN NEEDLE NANO U/F) 32G X 4 MM MISC USE THREE TIMES A DAY FOR INSULIN ADMINISTRATION. 270 each 1  . Insulin Syringe-Needle U-100 30G X 1/2" 1 ML MISC 1 each by Does not apply route 3 (three) times daily. 200 each 5  . JANUVIA 100 MG tablet TAKE 1 TABLET DAILY 90 tablet 1  . latanoprost (XALATAN) 0.005 % ophthalmic solution Place 1 drop into both eyes at bedtime.     Marland Kitchen LEVEMIR FLEXTOUCH 100 UNIT/ML Pen INJECT 42 UNITS UNDER THE SKIN DAILY AT 10 P.M. 45 mL 1  . levothyroxine (SYNTHROID, LEVOTHROID) 50 MCG tablet TAKE 1 TABLET DAILY BEFORE BREAKFAST 90 tablet 0  . metolazone (ZAROXOLYN) 5 MG  tablet TAKE 1 TABLET DAILY AS NEEDED 90 tablet 2  . metoprolol succinate (TOPROL-XL) 50 MG 24 hr tablet TAKE 1 TABLET TWICE A DAY 180 tablet 0  . montelukast (SINGULAIR) 10 MG tablet Take 1 tablet (10 mg total) by mouth at bedtime. 30 tablet 3  . nitroGLYCERIN (NITROSTAT) 0.4 MG SL tablet Place 0.4 mg under the tongue every 5 (five) minutes as needed.      . nystatin-triamcinolone (MYCOLOG II) cream Apply 1 application topically 2 (two) times daily.     . Potassium Chloride ER 20 MEQ TBCR Take 40 mEq by mouth 2 (two) times daily. 120 tablet 0  . predniSONE (DELTASONE) 10 MG tablet TAKE 1 TABLET DAILY WITH BREAKFAST 90 tablet 1  . SYMBICORT 160-4.5 MCG/ACT inhaler USE 2 INHALATIONS TWICE A DAY 30.6 g 3  . tamsulosin (FLOMAX) 0.4 MG CAPS capsule TAKE 1 CAPSULE DAILY 90 capsule 1  . tiotropium (SPIRIVA HANDIHALER) 18 MCG inhalation capsule Place 1 capsule (18 mcg total) daily into inhaler and inhale. 90 capsule 3  . torsemide (DEMADEX) 20 MG tablet TAKE 2 TABLETS TWICE A DAY 360 tablet 3   No current facility-administered medications for this visit.     Allergies  Allergen Reactions  . Contrast Media [Iodinated Diagnostic Agents] Shortness Of Breath  . Amlodipine Other (See Comments)  . Morphine And Related Other (See Comments)    Hallucinations   . Niacin And Related Dermatitis  . Other     Other reaction(s): SHORTNESS OF BREATH    Family History  Problem Relation Age of Onset  . Heart disease Mother   . Heart disease Maternal Grandmother   . Diabetes Maternal Grandmother   . Cancer Neg Hx   . Stroke Neg Hx     Social History   Socioeconomic History  . Marital status: Married    Spouse name: Not on file  . Number of children: Not on file  . Years of education: Not on file  . Highest education level: Not on file  Occupational History  . Not on file  Social Needs  . Financial resource strain: Not on file  . Food insecurity:    Worry: Not on file    Inability: Not on file   . Transportation needs:    Medical: Not on file    Non-medical: Not on file  Tobacco Use  . Smoking status: Former Smoker    Packs/day: 2.00  Years: 40.00    Pack years: 80.00    Types: Cigarettes    Last attempt to quit: 05/24/1990    Years since quitting: 27.3  . Smokeless tobacco: Former Systems developer    Quit date: 05/24/1990  Substance and Sexual Activity  . Alcohol use: No  . Drug use: No  . Sexual activity: Not Currently  Lifestyle  . Physical activity:    Days per week: Not on file    Minutes per session: Not on file  . Stress: Not on file  Relationships  . Social connections:    Talks on phone: Not on file    Gets together: Not on file    Attends religious service: Not on file    Active member of club or organization: Not on file    Attends meetings of clubs or organizations: Not on file    Relationship status: Not on file  . Intimate partner violence:    Fear of current or ex partner: Not on file    Emotionally abused: Not on file    Physically abused: Not on file    Forced sexual activity: Not on file  Other Topics Concern  . Not on file  Social History Narrative  . Not on file     Constitutional: Denies fever, malaise, fatigue, headache or abrupt weight changes.  Skin: Pt reports redness, warmth and drainage of left leg. Denies rashes, lesions or ulcercations.    No other specific complaints in a complete review of systems (except as listed in HPI above).  Objective:   Physical Exam  BP 106/74   Pulse 64   Temp 97.7 F (36.5 C) (Oral)   Wt 181 lb (82.1 kg)   SpO2 97%   BMI 29.21 kg/m  Wt Readings from Last 3 Encounters:  09/30/17 181 lb (82.1 kg)  09/28/17 180 lb 7.1 oz (81.8 kg)  09/01/17 180 lb (81.6 kg)    General: Appears his stated age, obese in NAD. Skin: Open skin tear to left anterior shin. Surrounding redness, swelling and warmth. Area tender to palpation.  BMET    Component Value Date/Time   NA 138 09/28/2017 1039   NA 143  02/21/2015 1108   NA 137 08/05/2014 1839   K 2.7 (LL) 09/28/2017 1039   K 3.6 08/05/2014 1839   CL 89 (L) 09/28/2017 1039   CL 106 08/05/2014 1839   CO2 39 (H) 09/28/2017 1039   CO2 23 08/05/2014 1839   GLUCOSE 105 (H) 09/28/2017 1039   GLUCOSE 235 (H) 08/05/2014 1839   BUN 66 (H) 09/28/2017 1039   BUN 26 02/21/2015 1108   BUN 19 08/05/2014 1839   CREATININE 2.10 (H) 09/28/2017 1039   CREATININE 1.20 08/05/2014 1839   CALCIUM 9.6 09/28/2017 1039   CALCIUM 8.6 (L) 08/05/2014 1839   GFRNONAA 46 (L) 10/11/2016 0458   GFRNONAA 57 (L) 08/05/2014 1839   GFRAA 54 (L) 10/11/2016 0458   GFRAA >60 08/05/2014 1839    Lipid Panel     Component Value Date/Time   CHOL 104 11/22/2016 1019   TRIG 154.0 (H) 11/22/2016 1019   HDL 43.80 11/22/2016 1019   CHOLHDL 2 11/22/2016 1019   VLDL 30.8 11/22/2016 1019   LDLCALC 29 11/22/2016 1019    CBC    Component Value Date/Time   WBC 16.5 (H) 04/11/2017 1023   RBC 5.17 04/11/2017 1023   HGB 15.1 04/11/2017 1023   HGB 13.3 08/05/2014 1839   HCT 44.2 04/11/2017 1023  HCT 40.1 08/05/2014 1839   PLT 198 04/11/2017 1023   PLT 246 08/05/2014 1839   MCV 85.5 04/11/2017 1023   MCV 83 08/05/2014 1839   MCH 29.2 04/11/2017 1023   MCHC 34.1 04/11/2017 1023   RDW 16.1 (H) 04/11/2017 1023   RDW 16.3 (H) 08/05/2014 1839   LYMPHSABS 3.3 05/20/2016 1041   LYMPHSABS 2.3 08/05/2014 1839   MONOABS 1.7 (H) 05/20/2016 1041   MONOABS 2.0 (H) 08/05/2014 1839   EOSABS 0.2 05/20/2016 1041   EOSABS 0.1 08/05/2014 1839   BASOSABS 0.1 05/20/2016 1041   BASOSABS 0.1 08/05/2014 1839    Hgb A1C Lab Results  Component Value Date   HGBA1C 6.7 (H) 02/15/2017            Assessment & Plan:   Cellulitis LLE:  Encouraged elevation eRx for Augmentin BID x 10 days Warm compresses as needed for comfort  Return precautions discussed Webb Silversmith, NP

## 2017-10-03 ENCOUNTER — Telehealth: Payer: Self-pay | Admitting: Internal Medicine

## 2017-10-03 NOTE — Telephone Encounter (Signed)
VO given as instructed

## 2017-10-03 NOTE — Telephone Encounter (Signed)
Neosporin over open area to LLE, cover with bandaid, change daily.

## 2017-10-03 NOTE — Telephone Encounter (Signed)
[place] for nurse visit 2 x week for 2 weeks

## 2017-10-03 NOTE — Telephone Encounter (Signed)
Spoke to [place] and gave instructions but she states the pt should be able to do that... She reports pt just had nurse eval last Wed and wanted to know if you would be agreeable to visit 2x a week for 2 weeks to track the progress of pt while on Ax... Please advise

## 2017-10-03 NOTE — Telephone Encounter (Signed)
Copied from [place] [id number]. Topic: Quick Communication - See Telephone Encounter >> Oct 03, 2017  9:59 AM Bea Graff, NT wrote: CRM for notification. See Telephone encounter for: 10/03/17. Christina with [facility] calling to to see if there were wound care orders for this pt after his visit last week? CB#: [phone]

## 2017-10-04 ENCOUNTER — Encounter: Payer: Self-pay | Admitting: *Deleted

## 2017-10-04 ENCOUNTER — Other Ambulatory Visit: Payer: Self-pay

## 2017-10-07 NOTE — Discharge Instructions (Signed)

## 2017-10-10 ENCOUNTER — Other Ambulatory Visit: Payer: Self-pay | Admitting: Internal Medicine

## 2017-10-10 ENCOUNTER — Other Ambulatory Visit: Payer: Self-pay | Admitting: Family

## 2017-10-11 ENCOUNTER — Ambulatory Visit: Payer: Medicare Other | Admitting: Anesthesiology

## 2017-10-11 ENCOUNTER — Encounter
Admission: RE | Disposition: A | Discharge status: Home / Self Care | Payer: Self-pay | Source: Ambulatory Visit | Attending: Ophthalmology

## 2017-10-11 ENCOUNTER — Ambulatory Visit
Admission: RE | Admit: 2017-10-11 | Discharge: 2017-10-11 | Disposition: A | Discharge status: Home / Self Care | Payer: Medicare Other | Source: Ambulatory Visit | Attending: Ophthalmology | Admitting: Ophthalmology

## 2017-10-11 DIAGNOSIS — Z85828 Personal history of other malignant neoplasm of skin: Secondary | ICD-10-CM | POA: Insufficient documentation

## 2017-10-11 DIAGNOSIS — Z87891 Personal history of nicotine dependence: Secondary | ICD-10-CM | POA: Insufficient documentation

## 2017-10-11 DIAGNOSIS — I252 Old myocardial infarction: Secondary | ICD-10-CM | POA: Diagnosis not present

## 2017-10-11 DIAGNOSIS — Z8673 Personal history of transient ischemic attack (TIA), and cerebral infarction without residual deficits: Secondary | ICD-10-CM | POA: Insufficient documentation

## 2017-10-11 DIAGNOSIS — Z955 Presence of coronary angioplasty implant and graft: Secondary | ICD-10-CM | POA: Insufficient documentation

## 2017-10-11 DIAGNOSIS — E1136 Type 2 diabetes mellitus with diabetic cataract: Secondary | ICD-10-CM | POA: Diagnosis not present

## 2017-10-11 DIAGNOSIS — I2510 Atherosclerotic heart disease of native coronary artery without angina pectoris: Secondary | ICD-10-CM | POA: Diagnosis not present

## 2017-10-11 DIAGNOSIS — Z85118 Personal history of other malignant neoplasm of bronchus and lung: Secondary | ICD-10-CM | POA: Insufficient documentation

## 2017-10-11 DIAGNOSIS — J449 Chronic obstructive pulmonary disease, unspecified: Secondary | ICD-10-CM | POA: Diagnosis not present

## 2017-10-11 DIAGNOSIS — M1990 Unspecified osteoarthritis, unspecified site: Secondary | ICD-10-CM | POA: Insufficient documentation

## 2017-10-11 DIAGNOSIS — H2512 Age-related nuclear cataract, left eye: Secondary | ICD-10-CM | POA: Diagnosis present

## 2017-10-11 DIAGNOSIS — F329 Major depressive disorder, single episode, unspecified: Secondary | ICD-10-CM | POA: Diagnosis not present

## 2017-10-11 DIAGNOSIS — E7800 Pure hypercholesterolemia, unspecified: Secondary | ICD-10-CM | POA: Diagnosis not present

## 2017-10-11 DIAGNOSIS — I110 Hypertensive heart disease with heart failure: Secondary | ICD-10-CM | POA: Insufficient documentation

## 2017-10-11 DIAGNOSIS — I509 Heart failure, unspecified: Secondary | ICD-10-CM | POA: Insufficient documentation

## 2017-10-11 DIAGNOSIS — I4891 Unspecified atrial fibrillation: Secondary | ICD-10-CM | POA: Insufficient documentation

## 2017-10-11 DIAGNOSIS — F419 Anxiety disorder, unspecified: Secondary | ICD-10-CM | POA: Insufficient documentation

## 2017-10-11 DIAGNOSIS — E1140 Type 2 diabetes mellitus with diabetic neuropathy, unspecified: Secondary | ICD-10-CM | POA: Diagnosis not present

## 2017-10-11 DIAGNOSIS — G4730 Sleep apnea, unspecified: Secondary | ICD-10-CM | POA: Diagnosis not present

## 2017-10-11 HISTORY — DX: Presence of dental prosthetic device (complete) (partial): Z97.2

## 2017-10-11 HISTORY — PX: CATARACT EXTRACTION W/PHACO: SHX586

## 2017-10-11 HISTORY — DX: Other symptoms and signs involving the musculoskeletal system: R29.898

## 2017-10-11 LAB — GLUCOSE, CAPILLARY
Glucose-Capillary: 96 mg/dL (ref 70–99)
Glucose-Capillary: 118 mg/dL — ABNORMAL HIGH (ref 70–99)

## 2017-10-11 SURGERY — PHACOEMULSIFICATION, CATARACT, WITH IOL INSERTION
Anesthesia: Monitor Anesthesia Care | Site: Eye | Laterality: Left | Wound class: Clean

## 2017-10-11 MED ORDER — BRIMONIDINE TARTRATE-TIMOLOL 0.2-0.5 % OP SOLN
OPHTHALMIC | Status: DC | PRN
  Administered 2017-10-11: 1 [drp] via OPHTHALMIC

## 2017-10-11 MED ORDER — NA HYALUR & NA CHOND-NA HYALUR 0.4-0.35 ML IO KIT
PACK | INTRAOCULAR | Status: DC | PRN
  Administered 2017-10-11: 1 mL via INTRAOCULAR

## 2017-10-11 MED ORDER — MOXIFLOXACIN HCL 0.5 % OP SOLN
1.0000 [drp] | OPHTHALMIC | Status: DC | PRN
  Administered 2017-10-11 (×3): 1 [drp] via OPHTHALMIC

## 2017-10-11 MED ORDER — MIDAZOLAM HCL 2 MG/2ML IJ SOLN
INTRAMUSCULAR | Status: DC | PRN
  Administered 2017-10-11: 1 mg via INTRAVENOUS

## 2017-10-11 MED ORDER — LIDOCAINE HCL (PF) 2 % IJ SOLN
INTRAOCULAR | Status: DC | PRN
  Administered 2017-10-11: 1 mL

## 2017-10-11 MED ORDER — SODIUM HYALURONATE 23 MG/ML IO SOLN: INTRAOCULAR | Status: DC | PRN

## 2017-10-11 MED ORDER — EPINEPHRINE PF 1 MG/ML IJ SOLN
INTRAOCULAR | Status: DC | PRN
  Administered 2017-10-11: 115 mL via OPHTHALMIC

## 2017-10-11 MED ORDER — ARMC OPHTHALMIC DILATING DROPS
1.0000 "application " | OPHTHALMIC | Status: DC | PRN
  Administered 2017-10-11 (×3): 1 via OPHTHALMIC

## 2017-10-11 MED ORDER — FENTANYL CITRATE (PF) 100 MCG/2ML IJ SOLN
INTRAMUSCULAR | Status: DC | PRN
  Administered 2017-10-11: 50 ug via INTRAVENOUS

## 2017-10-11 MED ORDER — CEFUROXIME OPHTHALMIC INJECTION 1 MG/0.1 ML
INJECTION | OPHTHALMIC | Status: DC | PRN
  Administered 2017-10-11: 0.1 mL via INTRACAMERAL

## 2017-10-11 MED ORDER — LACTATED RINGERS IV SOLN: INTRAVENOUS | Status: DC

## 2017-10-11 SURGICAL SUPPLY — 21 items
CANNULA ANT/CHMB 27G (MISCELLANEOUS) ×1 IMPLANT
CANNULA ANT/CHMB 27GA (MISCELLANEOUS) ×3 IMPLANT
GLOVE SURG LX 7.5 STRW (GLOVE) ×2
GLOVE SURG LX STRL 7.5 STRW (GLOVE) ×1 IMPLANT
GLOVE SURG TRIUMPH 8.0 PF LTX (GLOVE) ×3 IMPLANT
GOWN STRL REUS W/ TWL LRG LVL3 (GOWN DISPOSABLE) ×2 IMPLANT
GOWN STRL REUS W/TWL LRG LVL3 (GOWN DISPOSABLE) ×6
LENS IOL TECNIS ITEC 21.0 (Intraocular Lens) ×2 IMPLANT
MARKER SKIN DUAL TIP RULER LAB (MISCELLANEOUS) ×3 IMPLANT
NDL FILTER BLUNT 18X1 1/2 (NEEDLE) ×1 IMPLANT
NEEDLE FILTER BLUNT 18X 1/2SAF (NEEDLE) ×2
NEEDLE FILTER BLUNT 18X1 1/2 (NEEDLE) ×1 IMPLANT
PACK CATARACT BRASINGTON (MISCELLANEOUS) ×3 IMPLANT
PACK EYE AFTER SURG (MISCELLANEOUS) ×3 IMPLANT
PACK OPTHALMIC (MISCELLANEOUS) ×3 IMPLANT
RING MALYGIN 7.0 (MISCELLANEOUS) IMPLANT
SYR 3ML LL SCALE MARK (SYRINGE) ×3 IMPLANT
SYR 5ML LL (SYRINGE) ×3 IMPLANT
SYR TB 1ML LUER SLIP (SYRINGE) ×3 IMPLANT
WATER STERILE IRR 500ML POUR (IV SOLUTION) ×3 IMPLANT
WIPE NON LINTING 3.25X3.25 (MISCELLANEOUS) ×3 IMPLANT

## 2017-10-11 NOTE — Op Note (Signed)
OPERATIVE NOTE  [name] [id number] 10/11/2017   PREOPERATIVE DIAGNOSIS:  Nuclear sclerotic cataract left eye. H25.12   POSTOPERATIVE DIAGNOSIS:    Nuclear sclerotic cataract left eye.     PROCEDURE:  Phacoemusification with posterior chamber intraocular lens placement of the left eye   LENS:   Implant Name Type Inv. Item Serial No. Manufacturer Lot No. LRB No. Used  LENS IOL DIOP 21.0 - [id number] Intraocular Lens LENS IOL DIOP 21.0 [id number]  Left 1        ULTRASOUND TIME: 27  % of 2 minutes 58 seconds, CDE 47.9  SURGEON:  Wyonia Hough, MD   ANESTHESIA:  Topical with tetracaine drops and 2% Xylocaine jelly, augmented with 1% preservative-free intracameral lidocaine.    COMPLICATIONS:  None.   DESCRIPTION OF PROCEDURE:  The patient was identified in the holding room and transported to the operating room and placed in the supine position under the operating microscope.  The left eye was identified as the operative eye and it was prepped and draped in the usual sterile ophthalmic fashion.   A 1 millimeter clear-corneal paracentesis was made at the 1:30 position.  0.5 ml of preservative-free 1% lidocaine was injected into the anterior chamber.  The anterior chamber was filled with Viscoat viscoelastic.  A 2.4 millimeter keratome was used to make a near-clear corneal incision at the 10:30 position.  .  A curvilinear capsulorrhexis was made with a cystotome and capsulorrhexis forceps.  Balanced salt solution was used to hydrodissect and hydrodelineate the nucleus.   Phacoemulsification was then used in stop and chop fashion to remove the lens nucleus and epinucleus.  The remaining cortex was then removed using the irrigation and aspiration handpiece. Provisc was then placed into the capsular bag to distend it for lens placement.  A lens was then injected into the capsular bag.  The remaining viscoelastic was aspirated.   Wounds were hydrated with balanced salt  solution.  The anterior chamber was inflated to a physiologic pressure with balanced salt solution.  No wound leaks were noted. Cefuroxime 0.1 ml of a 10mg /ml solution was injected into the anterior chamber for a dose of 1 mg of intracameral antibiotic at the completion of the case.   Timolol and Brimonidine drops were applied to the eye.  The patient was taken to the recovery room in stable condition without complications of anesthesia or surgery.  [name] 10/11/2017, 8:06 AM

## 2017-10-11 NOTE — Anesthesia Procedure Notes (Signed)
Procedure Name: MAC Date/Time: 10/11/2017 7:48 AM Performed by: Lind Guest, CRNA Pre-anesthesia Checklist: Patient identified, Emergency Drugs available, Suction available, Patient being monitored and Timeout performed Patient Re-evaluated:Patient Re-evaluated prior to induction Oxygen Delivery Method: Nasal cannula

## 2017-10-11 NOTE — Anesthesia Preprocedure Evaluation (Signed)
Anesthesia Evaluation  Patient identified by MRN, date of birth, ID band Patient awake    Reviewed: Allergy & Precautions, H&P , NPO status , Patient's Chart, lab work & pertinent test results  Airway Mallampati: II  TM Distance: >3 FB Neck ROM: full    Dental   Pulmonary COPD, former smoker,    Pulmonary exam normal        Cardiovascular hypertension, + CAD, + Past MI and +CHF  Normal cardiovascular exam     Neuro/Psych Anxiety CVA    GI/Hepatic Neg liver ROS, Medicated,  Endo/Other  diabetesHypothyroidism   Renal/GU      Musculoskeletal   Abdominal   Peds  Hematology negative hematology ROS (+)   Anesthesia Other Findings   Reproductive/Obstetrics negative OB ROS                             Anesthesia Physical Anesthesia Plan  ASA: III  Anesthesia Plan: MAC   Post-op Pain Management:    Induction:   PONV Risk Score and Plan:   Airway Management Planned:   Additional Equipment:   Intra-op Plan:   Post-operative Plan:   Informed Consent: I have reviewed the patients History and Physical, chart, labs and discussed the procedure including the risks, benefits and alternatives for the proposed anesthesia with the patient or authorized representative who has indicated his/her understanding and acceptance.     Plan Discussed with:   Anesthesia Plan Comments:         Anesthesia Quick Evaluation

## 2017-10-11 NOTE — H&P (Signed)
The History and Physical notes are on paper, have been signed, and are to be scanned. The patient remains stable and unchanged from the H&P.   Previous H&P reviewed, patient examined, and there are no changes.  Ashish Rossetti 10/11/2017 7:34 AM

## 2017-10-11 NOTE — Anesthesia Postprocedure Evaluation (Signed)
Anesthesia Post Note  Patient: [name].  Procedure(s) Performed: CATARACT EXTRACTION PHACO AND INTRAOCULAR LENS PLACEMENT (IOC) COMPLICATED LEFT DIABETIC (Left Eye)  Patient location during evaluation: PACU Anesthesia Type: MAC Level of consciousness: awake and alert Pain management: pain level controlled Vital Signs Assessment: post-procedure vital signs reviewed and stable Respiratory status: spontaneous breathing Cardiovascular status: blood pressure returned to baseline Postop Assessment: no headache Anesthetic complications: no    Jaci Standard, III,  Nissim Fleischer D

## 2017-10-11 NOTE — Transfer of Care (Signed)
Immediate Anesthesia Transfer of Care Note  Patient: [name].  Procedure(s) Performed: CATARACT EXTRACTION PHACO AND INTRAOCULAR LENS PLACEMENT (IOC) COMPLICATED LEFT DIABETIC (Left Eye)  Patient Location: PACU  Anesthesia Type: MAC  Level of Consciousness: awake, alert  and patient cooperative  Airway and Oxygen Therapy: Patient Spontanous Breathing and Patient connected to supplemental oxygen  Post-op Assessment: Post-op Vital signs reviewed, Patient's Cardiovascular Status Stable, Respiratory Function Stable, Patent Airway and No signs of Nausea or vomiting  Post-op Vital Signs: Reviewed and stable  Complications: No apparent anesthesia complications

## 2017-10-12 ENCOUNTER — Encounter: Payer: Self-pay | Admitting: Ophthalmology

## 2017-10-14 ENCOUNTER — Encounter: Payer: Self-pay | Admitting: Internal Medicine

## 2017-10-16 ENCOUNTER — Other Ambulatory Visit: Payer: Self-pay | Admitting: Internal Medicine

## 2017-10-17 MED ORDER — ATORVASTATIN CALCIUM 40 MG PO TABS: 40.0000 mg | ORAL_TABLET | Freq: Every day | ORAL | 0 refills | Status: DC

## 2017-10-17 MED ORDER — EZETIMIBE 10 MG PO TABS: 10.0000 mg | ORAL_TABLET | Freq: Every day | ORAL | 0 refills | Status: DC

## 2017-10-20 ENCOUNTER — Other Ambulatory Visit: Payer: Self-pay | Admitting: Internal Medicine

## 2017-10-21 ENCOUNTER — Encounter: Payer: Self-pay | Admitting: Internal Medicine

## 2017-10-21 ENCOUNTER — Other Ambulatory Visit: Payer: Self-pay | Admitting: Internal Medicine

## 2017-10-21 NOTE — Telephone Encounter (Signed)
Potassium Chloride refill Last OV:05/12/17 Last refill:10/11/16 by another provider EHO:[id number] Pharmacy: [facility], [place] [phone] (Phone) [phone] (Fax)

## 2017-10-21 NOTE — Telephone Encounter (Signed)
Copied from [place] [id number]. Topic: Quick Communication - See Telephone Encounter >> Oct 21, 2017  4:07 PM Mylinda Latina, NT wrote: CRM for notification. See Telephone encounter for: 10/21/17. Patient states he needs a refill of his Potassium Chloride ER [place], [place] - [address] [phone] (Phone) [phone] (Fax)

## 2017-10-24 ENCOUNTER — Other Ambulatory Visit: Payer: Self-pay | Admitting: Internal Medicine

## 2017-10-24 MED ORDER — POTASSIUM CHLORIDE CRYS ER 20 MEQ PO TBCR: 60.0000 meq | EXTENDED_RELEASE_TABLET | Freq: Two times a day (BID) | ORAL | 0 refills | Status: DC

## 2017-10-26 ENCOUNTER — Telehealth: Payer: Self-pay | Admitting: Cardiovascular Disease

## 2017-10-26 NOTE — Telephone Encounter (Signed)
Tim with [employer] calling to review some program changes that have taken place with their heart failure program. He states that they are now required to send [place] fax notifications regarding patients weight and any changes or symptoms he may be having. If patient should be experiencing any symptoms that do not resolve then they will reach out to speak with [place] about any further recommendations. Let him know that we are here if needed and to call with any concerns.

## 2017-10-29 ENCOUNTER — Other Ambulatory Visit: Payer: Self-pay | Admitting: Internal Medicine

## 2017-11-07 ENCOUNTER — Encounter: Payer: Self-pay | Admitting: Internal Medicine

## 2017-11-09 ENCOUNTER — Other Ambulatory Visit: Payer: Self-pay | Admitting: Pulmonary Disease

## 2017-11-09 ENCOUNTER — Other Ambulatory Visit: Payer: Self-pay

## 2017-11-09 DIAGNOSIS — F419 Anxiety disorder, unspecified: Secondary | ICD-10-CM

## 2017-11-09 MED ORDER — ALPRAZOLAM 0.25 MG PO TABS: 0.2500 mg | ORAL_TABLET | Freq: Every evening | ORAL | 0 refills | Status: DC | PRN

## 2017-11-09 NOTE — Telephone Encounter (Signed)
Name of Medication: Xanax 0.25mg  [facility] Last Fill or Written Date and Quantity: last filled 07/31/17 #90 Last Office Visit and Type: 09/2017--acute Next Office Visit and Type:none

## 2017-11-10 ENCOUNTER — Telehealth: Payer: Self-pay | Admitting: Pulmonary Disease

## 2017-11-10 NOTE — Telephone Encounter (Signed)
Returned call to [employer]. They have a paid claim no further issues.

## 2017-11-10 NOTE — Telephone Encounter (Signed)
Pharmacy needs a call back needs clarification on script on Albuterol  Please call back   Please call [phone]  Ref# [id number]

## 2017-11-14 ENCOUNTER — Telehealth: Payer: Self-pay | Admitting: Cardiovascular Disease

## 2017-11-14 NOTE — Telephone Encounter (Signed)
Tim with [facility] nurse case manager calling patient has gained 5.6 pounds in three days  He's states this is routine for patient Patient is taking his Metolazone   Just calling to [facility] the providers

## 2017-11-15 NOTE — Telephone Encounter (Signed)
Call placed to the patient. He stated that  he gained 5 pounds in the past week and weighed 188 pounds. He took a metolazone yesterday and lost 5 pounds, brining him down to his base weight. He stated that this is his normal process.   He has been advised to call the office if he has any further needs.

## 2017-11-16 NOTE — Telephone Encounter (Signed)
Nurse calling to report a lab States he will be faxing information over - just wanted to make office aware

## 2017-11-16 NOTE — Telephone Encounter (Signed)
Received a report from [place] with most recent nurse note: "Registers 6.4lb wt loss over 2 days after taking metolazone by protocol for wt spike. Reports no dizziness or cramping, or other untoward symptoms. Instructed to call in with onset of same. This is his usual response to metolazone. No aciton requested; reportable weight loss FYI only."  Called patient and he states he is at his base weight. Denies shortness or breath, swelling or chest pain at this time.

## 2017-11-17 ENCOUNTER — Ambulatory Visit: Payer: Self-pay

## 2017-11-17 NOTE — Telephone Encounter (Signed)
He needs to be seen tomorrow

## 2017-11-17 NOTE — Telephone Encounter (Signed)
Patient called in with c/o "leg swelling." He says "my left leg is swollen, but not more than usual to me, and it is weeping. My home health nurse came out and said my leg was cold to touch and told me to call to make an appointment, because I may have a blood clot." I asked about the leg being cold, he says "I have neuropathy and I can't tell any difference, but since she said call, I did." I asked about pain, fever, any other symptoms, he says "no pain that I can tell, no fever and no shortness of breath, chest pain or anything." According to protocol, see PCP within 3 days, appointment scheduled for Tuesday, 11/22/17 at 1045 with Webb Silversmith, FNP, care advice given, patient verbalized understanding.  Reason for Disposition . [1] MILD swelling of both ankles (i.e., pedal edema) AND [2] new onset or worsening  Answer Assessment - Initial Assessment Questions 1. ONSET: "When did the swelling start?" (e.g., minutes, hours, days)     It's always swollen 2. LOCATION: "What part of the leg is swollen?"  "Are both legs swollen or just one leg?"     Left lower leg 3. SEVERITY: "How bad is the swelling?" (e.g., localized; mild, moderate, severe)  - Localized - small area of swelling localized to one leg  - MILD pedal edema - swelling limited to foot and ankle, pitting edema < 1/4 inch (6 mm) deep, rest and elevation eliminate most or all swelling  - MODERATE edema - swelling of lower leg to knee, pitting edema > 1/4 inch (6 mm) deep, rest and elevation only partially reduce swelling  - SEVERE edema - swelling extends above knee, facial or hand swelling present      Moderate 4. REDNESS: "Does the swelling look red or infected?"     It's always red 5. PAIN: "Is the swelling painful to touch?" If so, ask: "How painful is it?"   (Scale 1-10; mild, moderate or severe)     No 6. FEVER: "Do you have a fever?" If so, ask: "What is it, how was it measured, and when did it start?"      No 7. CAUSE: "What do you  think is causing the leg swelling?"     I don't know 8. MEDICAL HISTORY: "Do you have a history of heart failure, kidney disease, liver failure, or cancer?"     Cancer, but it was shrunk 9. RECURRENT SYMPTOM: "Have you had leg swelling before?" If so, ask: "When was the last time?" "What happened that time?"     Yes 10. OTHER SYMPTOMS: "Do you have any other symptoms?" (e.g., chest pain, difficulty breathing)      Weeping of the leg 11. PREGNANCY: "Is there any chance you are pregnant?" "When was your last menstrual period?"       N/A  Protocols used: LEG SWELLING AND EDEMA-A-AH

## 2017-11-17 NOTE — Telephone Encounter (Signed)
Noted  

## 2017-11-17 NOTE — Telephone Encounter (Signed)
[facility] scheduled pt 30' appt on 11/22/17 at 10:45, is that OK or should pt be seen sooner? Also [facility] skyped me and advised pt has AWV on 11/23/17 and wanted to know if that appt should be changed. Please advise.

## 2017-11-17 NOTE — Telephone Encounter (Addendum)
I spoke with pt; pt scheduled 30'appt with Dr Diona Browner 11/18/17 at 11:45 per Avie Echevaria NP. Gave ED precautions if pt condition changes or worsens or CP or SOB and pt voiced understanding.

## 2017-11-18 ENCOUNTER — Encounter: Payer: Self-pay | Admitting: Cardiovascular Disease

## 2017-11-18 ENCOUNTER — Encounter: Payer: Self-pay | Admitting: Family Medicine

## 2017-11-18 ENCOUNTER — Ambulatory Visit (INDEPENDENT_AMBULATORY_CARE_PROVIDER_SITE_OTHER): Payer: Medicare Other | Admitting: Family Medicine

## 2017-11-18 ENCOUNTER — Encounter: Payer: Self-pay | Admitting: *Deleted

## 2017-11-18 DIAGNOSIS — I5032 Chronic diastolic (congestive) heart failure: Secondary | ICD-10-CM | POA: Diagnosis not present

## 2017-11-18 DIAGNOSIS — M7989 Other specified soft tissue disorders: Secondary | ICD-10-CM

## 2017-11-18 DIAGNOSIS — R21 Rash and other nonspecific skin eruption: Secondary | ICD-10-CM | POA: Diagnosis not present

## 2017-11-18 MED ORDER — DOXYCYCLINE HYCLATE 100 MG PO TABS: 100.0000 mg | ORAL_TABLET | Freq: Two times a day (BID) | ORAL | 0 refills | Status: DC

## 2017-11-18 MED ORDER — CLOTRIMAZOLE 1 % EX CREA: 1.0000 "application " | TOPICAL_CREAM | Freq: Two times a day (BID) | CUTANEOUS | 0 refills | Status: DC

## 2017-11-18 NOTE — Progress Notes (Signed)
Fax received dated 11/17/17 from [facility].  Nurse notes: "Weight loss of 7.8 lbs over the last 3 days. Denies being more tired than usual or dizziness."  In reviewing the patient's chart, he was seen by Dr. Diona Browner today.

## 2017-11-18 NOTE — Assessment & Plan Note (Signed)
Some increase in weight in last 4 days... Seems to have uniform increase in swelling in lower ext. May need additional diuretic.. IF wt goes above 186.. Take zarolxylyn x 1 dose.

## 2017-11-18 NOTE — Assessment & Plan Note (Signed)
May be cellultitis.. But very well demarcated and not warm.. Did not improve much with augmentin in 09/2017.  given increased risk for infection with DM.Marland Kitchen Will cover with antibiotics... Will change to cover MRSA with doxy x 10 days.  Rash appears  A lot like fungal rash.. Will also treat with topical antifungal.

## 2017-11-18 NOTE — Patient Instructions (Addendum)
Elevate leg above you heart as able.  Apply topical antifungal cream. Complete antibiotics. If weight increases above 186.. Take a dose of metalazone. If leg pain increases or new fever.. Call.

## 2017-11-18 NOTE — Assessment & Plan Note (Signed)
Appears to be uniform increase In swelling, slight more in left likely due to rash.  No significant new left calf pain. Doubt DVT. NO clear indication  For  Doppler to r/out DVT.

## 2017-11-18 NOTE — Progress Notes (Signed)
   Subjective:    Patient ID: [name]., male    DOB: 1931-12-09, 84 y.o.   MRN: [id number]  HPI   84 year old male presents with his son, pt of [name]'s, history of afib, CHF, COPD, CVA, DM for swelling in left lower leg x 2-3 weeks.   Now in last 6 weeks.[name]  PCP 6/21 Dx with cellulitis.  Symptoms improved.. Less redness and swelling but never resolved.  redness and swelling retuned in last 2 weeks.    Has gained  4 lbs in last day.. Taking the torsemdie 2 tab twice daily. Can use zarolxylyn prn if > 186-188. Wt Readings from Last 3 Encounters:  11/18/17 185 lb 8 oz (84.1 kg)  10/11/17 184 lb (83.5 kg)  09/30/17 181 lb (82.1 kg)     No fever. No pain, area is cold all the time. No flu like symptoms.    ABI 05/2017:R ABI 1.6 - likely falsely high due to arteriosclerosis L ABI 0.75 - low   Blood pressure 100/62, pulse 83, temperature 98.2 F (36.8 C), temperature source Oral, height 5\' 6"  (1.676 m), weight 185 lb 8 oz (84.1 kg).  Review of Systems  Constitutional: Negative for fatigue and fever.  HENT: Negative for ear pain.   Eyes: Negative for pain.  Respiratory: Negative for cough and shortness of breath.   Cardiovascular: Negative for chest pain, palpitations and leg swelling.  Gastrointestinal: Negative for abdominal pain.  Genitourinary: Negative for dysuria.  Musculoskeletal: Negative for arthralgias.  Neurological: Negative for syncope, light-headedness and headaches.  Psychiatric/Behavioral: Negative for dysphoric mood.       Objective:   Physical Exam  Constitutional: Vital signs are normal. He appears well-developed and well-nourished.  HENT:  Head: Normocephalic.  Right Ear: Hearing normal.  Left Ear: Hearing normal.  Nose: Nose normal.  Mouth/Throat: Oropharynx is clear and moist and mucous membranes are normal.  Neck: Trachea normal. Carotid bruit is not present. No thyroid mass and no thyromegaly present.  Cardiovascular: Normal rate  and normal pulses. An irregularly irregular rhythm present. Exam reveals no gallop, no distant heart sounds and no friction rub.  No murmur heard. No peripheral edema  Pulmonary/Chest: Effort normal and breath sounds normal. No respiratory distress.  Skin: Skin is warm, dry and intact. No rash noted.     Very well demarcated round rash on left anterior calf.. Blanching,  No streaking, area weeping clear fluid.   NO increased warmth. Approximaltely 14 x 12 cm   Mild calf pain on left, not new per pt   Bilateral edema 2 plus pitting.. Pr to son left leg always more swollen than right since CVA.  1 plus DP pulse bilaterally  Psychiatric: He has a normal mood and affect. His speech is normal and behavior is normal. Thought content normal.          Assessment & Plan:

## 2017-11-21 ENCOUNTER — Ambulatory Visit: Payer: Self-pay | Admitting: *Deleted

## 2017-11-21 NOTE — Telephone Encounter (Signed)
Dr Diona Browner is out of office today. Will FYI to Dr Diona Browner and Avie Echevaria NP.

## 2017-11-21 NOTE — Telephone Encounter (Signed)
Patient calling with questions regarding doxycycline which he started 3 days ago for possible cellulitis of lower extremity. Reporting his urine output is at least 50% down from prior to starting doxy. He also reports mild SOB while in bed. Denies wheezing/fever. Reports the left lower leg continues to weep fluid, it does not feel warm to touch.Stated the reddened area on the back of the leg has increased in size.  Hurts only when he bumps the area. Takes Torsemide 20 MG tab as prescribed. Using Lotrimin 1% cream to the affected area twice daily.  Patient is concerned the doxycycline is interfering with the torsemide. Routing to PCP for review.Appointment scheduled with Dr. Diona Browner in the morning He is seeing Garnette Gunner, NP for a check up on Wednesday.  Please advise.  Reason for Disposition . Caller has URGENT medication question about med that PCP prescribed and triager unable to answer question  Answer Assessment - Initial Assessment Questions 1. SYMPTOMS: "Do you have any symptoms?"     Left lower leg weeping with fluid. 2. SEVERITY: If symptoms are present, ask "Are they mild, moderate or severe?"     moderate  Protocols used: MEDICATION QUESTION CALL-A-AH

## 2017-11-22 ENCOUNTER — Encounter: Payer: Self-pay | Admitting: *Deleted

## 2017-11-22 ENCOUNTER — Encounter: Payer: Self-pay | Admitting: Family Medicine

## 2017-11-22 ENCOUNTER — Ambulatory Visit (INDEPENDENT_AMBULATORY_CARE_PROVIDER_SITE_OTHER): Payer: Medicare Other | Admitting: Family Medicine

## 2017-11-22 ENCOUNTER — Other Ambulatory Visit: Payer: Self-pay

## 2017-11-22 ENCOUNTER — Ambulatory Visit: Payer: Medicare Other | Admitting: Family Medicine

## 2017-11-22 ENCOUNTER — Ambulatory Visit: Payer: Medicare Other | Admitting: Internal Medicine

## 2017-11-22 VITALS — BP 100/70 | HR 79 | Temp 97.5°F | Ht 66.0 in | Wt 184.2 lb

## 2017-11-22 DIAGNOSIS — M7989 Other specified soft tissue disorders: Secondary | ICD-10-CM | POA: Diagnosis not present

## 2017-11-22 DIAGNOSIS — L03116 Cellulitis of left lower limb: Secondary | ICD-10-CM | POA: Diagnosis not present

## 2017-11-22 DIAGNOSIS — I5032 Chronic diastolic (congestive) heart failure: Secondary | ICD-10-CM

## 2017-11-22 MED ORDER — AMOXICILLIN-POT CLAVULANATE 875-125 MG PO TABS: 1.0000 | ORAL_TABLET | Freq: Two times a day (BID) | ORAL | 0 refills | Status: DC

## 2017-11-22 NOTE — Assessment & Plan Note (Signed)
Fluid overload improved with zaroxlyn dose and continue torsemide.

## 2017-11-22 NOTE — Progress Notes (Signed)
   Subjective:    Patient ID: Shawn Grace., male    DOB: [birth year], 84 y.o.   MRN: [id number]  HPI   84 year old male presents for follow up cellulitis/ possible fungal infection in left lower leg. Took 4 days of doxycycline but stopped given he felt it decreased his urine output and contributed to swelling. He noted no improvement with  Leg rash.  Hx of CHF, COPD, afib:  Reports SOB in last few days.  Taking torsemide 2 tabs twice daily  Weight had gone up to 187... At home went down 181 after he took a Physicist, medical.  He is no longer short of breath with the weight loss. No urine outout is back to normal.   Wt Readings from Last 3 Encounters:  11/22/17 184 lb 4 oz (83.6 kg)  11/18/17 185 lb 8 oz (84.1 kg)  10/11/17 184 lb (83.5 kg)    Has follow up with PCp  tommorow but will move back.  Social History /Family History/Past Medical History reviewed in detail and updated in EMR if needed.   Review of Systems  Constitutional: Negative for fatigue and fever.  HENT: Negative for ear pain.   Eyes: Negative for pain.  Respiratory: Negative for cough and shortness of breath.   Cardiovascular: Negative for chest pain, palpitations and leg swelling.  Gastrointestinal: Negative for abdominal pain.  Genitourinary: Negative for dysuria.  Musculoskeletal: Negative for arthralgias.  Neurological: Negative for syncope, light-headedness and headaches.  Psychiatric/Behavioral: Negative for dysphoric mood.       Objective:   Physical Exam  Constitutional: Vital signs are normal. He appears well-developed and well-nourished.  HENT:  Head: Normocephalic.  Right Ear: Hearing normal.  Left Ear: Hearing normal.  Nose: Nose normal.  Mouth/Throat: Oropharynx is clear and moist and mucous membranes are normal.  Neck: Trachea normal. Carotid bruit is not present. No thyroid mass and no thyromegaly present.  Cardiovascular: Normal rate and normal pulses. An irregularly irregular rhythm  present. Exam reveals no gallop, no distant heart sounds and no friction rub.  No murmur heard. No peripheral edema  Pulmonary/Chest: Effort normal and breath sounds normal. No respiratory distress.  Skin: Skin is warm, dry and intact. No rash noted.     Very well demarcated round rash on left anterior calf.. Blanching,  No streaking, area weeping clear fluid.   NO increased warmth. Approximaltely 14 x 12 cm still.. No change   Mild calf pain on left, not new per pt   Bilateral edema 1 plus pitting..   1 plus DP pulse bilaterally, but both feet cold  Psychiatric: He has a normal mood and affect. His speech is normal and behavior is normal. Thought content normal.          Assessment & Plan:

## 2017-11-22 NOTE — Telephone Encounter (Signed)
Pt seen today

## 2017-11-22 NOTE — Assessment & Plan Note (Signed)
No better OR worse with doxy x 4 days and antifungal cream. Per pt augmentin more efective in past and no SE ( he feels doxy worsened his swelling). Will change to augmentin x 10 days.. Follow up with PCP and call sooner if spreading or fever.

## 2017-11-22 NOTE — Patient Instructions (Addendum)
Stop doxycyline and change to Augmentin.  Push back wellness visit to next  Week .  Call if new fever, rash spreading or shortness of breath.

## 2017-11-23 ENCOUNTER — Ambulatory Visit: Payer: Medicare Other | Admitting: Internal Medicine

## 2017-11-24 ENCOUNTER — Telehealth: Payer: Self-pay | Admitting: Cardiovascular Disease

## 2017-11-24 NOTE — Telephone Encounter (Signed)
[employer] calling and states patient has had 5.2 weight loss Initiated fluid plan if he has weight increase  Contact patient with any concerns

## 2017-11-24 NOTE — Telephone Encounter (Signed)
Spoke with patient and he denies any concerns at this time. Advised to please call back if he should have any further questions.

## 2017-11-24 NOTE — Discharge Instructions (Signed)

## 2017-11-30 ENCOUNTER — Ambulatory Visit: Payer: Medicare Other | Admitting: Anesthesiology

## 2017-11-30 ENCOUNTER — Encounter
Admission: RE | Disposition: A | Discharge status: Home / Self Care | Payer: Self-pay | Source: Ambulatory Visit | Attending: Ophthalmology

## 2017-11-30 ENCOUNTER — Telehealth: Payer: Self-pay | Admitting: Cardiovascular Disease

## 2017-11-30 ENCOUNTER — Ambulatory Visit
Admission: RE | Admit: 2017-11-30 | Discharge: 2017-11-30 | Disposition: A | Discharge status: Home / Self Care | Payer: Medicare Other | Source: Ambulatory Visit | Attending: Ophthalmology | Admitting: Ophthalmology

## 2017-11-30 DIAGNOSIS — F329 Major depressive disorder, single episode, unspecified: Secondary | ICD-10-CM | POA: Diagnosis not present

## 2017-11-30 DIAGNOSIS — Z85828 Personal history of other malignant neoplasm of skin: Secondary | ICD-10-CM | POA: Insufficient documentation

## 2017-11-30 DIAGNOSIS — Z923 Personal history of irradiation: Secondary | ICD-10-CM | POA: Diagnosis not present

## 2017-11-30 DIAGNOSIS — Z8673 Personal history of transient ischemic attack (TIA), and cerebral infarction without residual deficits: Secondary | ICD-10-CM | POA: Diagnosis not present

## 2017-11-30 DIAGNOSIS — K219 Gastro-esophageal reflux disease without esophagitis: Secondary | ICD-10-CM | POA: Diagnosis not present

## 2017-11-30 DIAGNOSIS — I252 Old myocardial infarction: Secondary | ICD-10-CM | POA: Insufficient documentation

## 2017-11-30 DIAGNOSIS — Z87891 Personal history of nicotine dependence: Secondary | ICD-10-CM | POA: Insufficient documentation

## 2017-11-30 DIAGNOSIS — I4891 Unspecified atrial fibrillation: Secondary | ICD-10-CM | POA: Insufficient documentation

## 2017-11-30 DIAGNOSIS — E1140 Type 2 diabetes mellitus with diabetic neuropathy, unspecified: Secondary | ICD-10-CM | POA: Diagnosis not present

## 2017-11-30 DIAGNOSIS — Z794 Long term (current) use of insulin: Secondary | ICD-10-CM | POA: Insufficient documentation

## 2017-11-30 DIAGNOSIS — G4730 Sleep apnea, unspecified: Secondary | ICD-10-CM | POA: Insufficient documentation

## 2017-11-30 DIAGNOSIS — Z91041 Radiographic dye allergy status: Secondary | ICD-10-CM | POA: Insufficient documentation

## 2017-11-30 DIAGNOSIS — M1990 Unspecified osteoarthritis, unspecified site: Secondary | ICD-10-CM | POA: Insufficient documentation

## 2017-11-30 DIAGNOSIS — E7800 Pure hypercholesterolemia, unspecified: Secondary | ICD-10-CM | POA: Insufficient documentation

## 2017-11-30 DIAGNOSIS — Z955 Presence of coronary angioplasty implant and graft: Secondary | ICD-10-CM | POA: Insufficient documentation

## 2017-11-30 DIAGNOSIS — I509 Heart failure, unspecified: Secondary | ICD-10-CM | POA: Insufficient documentation

## 2017-11-30 DIAGNOSIS — Z85118 Personal history of other malignant neoplasm of bronchus and lung: Secondary | ICD-10-CM | POA: Diagnosis not present

## 2017-11-30 DIAGNOSIS — H5703 Miosis: Secondary | ICD-10-CM | POA: Insufficient documentation

## 2017-11-30 DIAGNOSIS — I110 Hypertensive heart disease with heart failure: Secondary | ICD-10-CM | POA: Diagnosis not present

## 2017-11-30 DIAGNOSIS — I2510 Atherosclerotic heart disease of native coronary artery without angina pectoris: Secondary | ICD-10-CM | POA: Diagnosis not present

## 2017-11-30 DIAGNOSIS — J449 Chronic obstructive pulmonary disease, unspecified: Secondary | ICD-10-CM | POA: Diagnosis not present

## 2017-11-30 DIAGNOSIS — Z79899 Other long term (current) drug therapy: Secondary | ICD-10-CM | POA: Insufficient documentation

## 2017-11-30 DIAGNOSIS — N400 Enlarged prostate without lower urinary tract symptoms: Secondary | ICD-10-CM | POA: Insufficient documentation

## 2017-11-30 DIAGNOSIS — Z7901 Long term (current) use of anticoagulants: Secondary | ICD-10-CM | POA: Diagnosis not present

## 2017-11-30 DIAGNOSIS — H2511 Age-related nuclear cataract, right eye: Secondary | ICD-10-CM | POA: Diagnosis present

## 2017-11-30 HISTORY — PX: CATARACT EXTRACTION W/PHACO: SHX586

## 2017-11-30 LAB — GLUCOSE, CAPILLARY
Glucose-Capillary: 111 mg/dL — ABNORMAL HIGH (ref 70–99)
Glucose-Capillary: 215 mg/dL — ABNORMAL HIGH (ref 70–99)

## 2017-11-30 SURGERY — PHACOEMULSIFICATION, CATARACT, WITH IOL INSERTION
Anesthesia: Monitor Anesthesia Care | Site: Eye | Laterality: Right | Wound class: Clean

## 2017-11-30 MED ORDER — MOXIFLOXACIN HCL 0.5 % OP SOLN
1.0000 [drp] | OPHTHALMIC | Status: DC | PRN
  Administered 2017-11-30 (×3): 1 [drp] via OPHTHALMIC

## 2017-11-30 MED ORDER — ACETAMINOPHEN 160 MG/5ML PO SOLN: 325.0000 mg | Freq: Once | ORAL | Status: DC

## 2017-11-30 MED ORDER — ACETAMINOPHEN 325 MG PO TABS: 325.0000 mg | ORAL_TABLET | Freq: Once | ORAL | Status: DC

## 2017-11-30 MED ORDER — LIDOCAINE HCL (PF) 2 % IJ SOLN
INTRAOCULAR | Status: DC | PRN
  Administered 2017-11-30: 1 mL

## 2017-11-30 MED ORDER — ARMC OPHTHALMIC DILATING DROPS
1.0000 | OPHTHALMIC | Status: DC | PRN
Start: 2017-11-30 — End: 2017-11-30
  Administered 2017-11-30 (×3): 1 via OPHTHALMIC

## 2017-11-30 MED ORDER — FENTANYL CITRATE (PF) 100 MCG/2ML IJ SOLN
INTRAMUSCULAR | Status: DC | PRN
  Administered 2017-11-30: 50 ug via INTRAVENOUS

## 2017-11-30 MED ORDER — NA HYALUR & NA CHOND-NA HYALUR 0.4-0.35 ML IO KIT
PACK | INTRAOCULAR | Status: DC | PRN
  Administered 2017-11-30: 1 mL via INTRAOCULAR

## 2017-11-30 MED ORDER — BRIMONIDINE TARTRATE-TIMOLOL 0.2-0.5 % OP SOLN
OPHTHALMIC | Status: DC | PRN
  Administered 2017-11-30: 1 [drp] via OPHTHALMIC

## 2017-11-30 MED ORDER — EPINEPHRINE PF 1 MG/ML IJ SOLN
INTRAOCULAR | Status: DC | PRN
  Administered 2017-11-30: 104 mL via OPHTHALMIC

## 2017-11-30 MED ORDER — MIDAZOLAM HCL 2 MG/2ML IJ SOLN
INTRAMUSCULAR | Status: DC | PRN
  Administered 2017-11-30: 1 mg via INTRAVENOUS

## 2017-11-30 MED ORDER — LACTATED RINGERS IV SOLN: INTRAVENOUS | Status: DC

## 2017-11-30 MED ORDER — CEFUROXIME OPHTHALMIC INJECTION 1 MG/0.1 ML
INJECTION | OPHTHALMIC | Status: DC | PRN
  Administered 2017-11-30: 0.1 mL via OPHTHALMIC

## 2017-11-30 SURGICAL SUPPLY — 26 items
CANNULA ANT/CHMB 27GA (MISCELLANEOUS) ×3 IMPLANT
CARTRIDGE ABBOTT (MISCELLANEOUS) IMPLANT
GLOVE SURG LX 7.5 STRW (GLOVE) ×2
GLOVE SURG LX STRL 7.5 STRW (GLOVE) ×1 IMPLANT
GLOVE SURG TRIUMPH 8.0 PF LTX (GLOVE) ×3 IMPLANT
GOWN STRL REUS W/ TWL LRG LVL3 (GOWN DISPOSABLE) ×2 IMPLANT
GOWN STRL REUS W/TWL LRG LVL3 (GOWN DISPOSABLE) ×6
LENS IOL TECNIS ITEC 21.0 (Intraocular Lens) ×2 IMPLANT
MARKER SKIN DUAL TIP RULER LAB (MISCELLANEOUS) ×3 IMPLANT
NDL FILTER BLUNT 18X1 1/2 (NEEDLE) ×1 IMPLANT
NDL RETROBULBAR .5 NSTRL (NEEDLE) IMPLANT
NEEDLE FILTER BLUNT 18X 1/2SAF (NEEDLE) ×2
NEEDLE FILTER BLUNT 18X1 1/2 (NEEDLE) ×1 IMPLANT
PACK CATARACT BRASINGTON (MISCELLANEOUS) ×3 IMPLANT
PACK EYE AFTER SURG (MISCELLANEOUS) ×3 IMPLANT
PACK OPTHALMIC (MISCELLANEOUS) ×3 IMPLANT
RING MALYGIN 7.0 (MISCELLANEOUS) ×2 IMPLANT
SUT ETHILON 10-0 CS-B-6CS-B-6 (SUTURE)
SUT VICRYL  9 0 (SUTURE)
SUT VICRYL 9 0 (SUTURE) IMPLANT
SUTURE EHLN 10-0 CS-B-6CS-B-6 (SUTURE) IMPLANT
SYR 3ML LL SCALE MARK (SYRINGE) ×3 IMPLANT
SYR 5ML LL (SYRINGE) ×3 IMPLANT
SYR TB 1ML LUER SLIP (SYRINGE) ×3 IMPLANT
WATER STERILE IRR 500ML POUR (IV SOLUTION) ×3 IMPLANT
WIPE NON LINTING 3.25X3.25 (MISCELLANEOUS) ×3 IMPLANT

## 2017-11-30 NOTE — Telephone Encounter (Signed)
[facility] Nurse calling back. States they must call whenever patient has a greater than 5 pound weight gain or loss.  This patient had 5 pound loss in 3 days. He says patient has a regular pattern of gaining weight and needing to take metolazone as ordered.  Patient has no complaints at this time and is stable.  Nothing further needed at this time.

## 2017-11-30 NOTE — Anesthesia Procedure Notes (Signed)
Procedure Name: MAC Date/Time: 11/30/2017 7:39 AM Performed by: Lind Guest, CRNA Pre-anesthesia Checklist: Patient identified, Emergency Drugs available, Suction available, Patient being monitored and Timeout performed Patient Re-evaluated:Patient Re-evaluated prior to induction Oxygen Delivery Method: Nasal cannula

## 2017-11-30 NOTE — Transfer of Care (Signed)
Immediate Anesthesia Transfer of Care Note  Patient: [name].  Procedure(s) Performed: CATARACT EXTRACTION PHACO AND INTRAOCULAR LENS PLACEMENT (IOC) COMPLICATED  RIGHT DIABETIC (Right Eye)  Patient Location: PACU  Anesthesia Type: MAC  Level of Consciousness: awake, alert  and patient cooperative  Airway and Oxygen Therapy: Patient Spontanous Breathing and Patient connected to supplemental oxygen  Post-op Assessment: Post-op Vital signs reviewed, Patient's Cardiovascular Status Stable, Respiratory Function Stable, Patent Airway and No signs of Nausea or vomiting  Post-op Vital Signs: Reviewed and stable  Complications: No apparent anesthesia complications

## 2017-11-30 NOTE — Telephone Encounter (Signed)
Nurse states pt has lost 5 pounds in 3 days. States pt is having no symptoms.

## 2017-11-30 NOTE — H&P (Signed)
The History and Physical notes are on paper, have been signed, and are to be scanned. The patient remains stable and unchanged from the H&P.   Previous H&P reviewed, patient examined, and there are no changes.  Abel Ra 11/30/2017 7:30 AM

## 2017-11-30 NOTE — Op Note (Signed)
OPERATIVE NOTE  [name] [id number] 11/30/2017   PREOPERATIVE DIAGNOSIS:    Nuclear Sclerotic Cataract Right eye with miotic pupil.        H25.11  POSTOPERATIVE DIAGNOSIS: Nuclear Sclerotic Cataract Right eye with miotic pupil.          PROCEDURE:  Phacoemusification with posterior chamber intraocular lens placement of the right eye which required pupil stretching with the Malyugin pupil expansion device.  LENS:   Implant Name Type Inv. Item Serial No. Manufacturer Lot No. LRB No. Used  LENS IOL DIOP 21.0 - [id number] Intraocular Lens LENS IOL DIOP 21.0 [id number]  Right 1       ULTRASOUND TIME: 28 % of 2 minutes 46 seconds, CDE 46.6  SURGEON:  Wyonia Hough, MD   ANESTHESIA:  Topical with tetracaine drops and 2% Xylocaine jelly, augmented with 1% preservative-free intracameral lidocaine.   COMPLICATIONS:  Posterior capsule tear without vitreous loss   DESCRIPTION OF PROCEDURE:  The patient was identified in the holding room and transported to the operating room and placed in the supine position under the operating microscope. Theright eye was identified as the operative eye and it was prepped and draped in the usual sterile ophthalmic fashion.   A 1 millimeter clear-corneal paracentesis was made at the 12:00 position.  0.5 ml of preservative-free 1% lidocaine was injected into the anterior chamber. The anterior chamber was filled with Viscoat viscoelastic.  A 2.4 millimeter keratome was used to make a near-clear corneal incision at the 9:00 position. A Malyugin pupil expander was then placed through the main incision and into the anterior chamber of the eye.  The edge of the iris was secured on the lip of the pupil expander and it was released, thereby expanding the pupil to approximately 7 millimeters for completion of the cataract surgery.  Additional Viscoat was placed in the anterior chamber.  A cystotome and capsulorrhexis forceps were used to make a  curvilinear capsulorrhexis.   Balanced salt solution was used to hydrodissect and hydrodelineate the lens nucleus.   Phacoemulsification was used in stop and chop fashion to remove the lens, nucleus and epinucleus.  The remaining cortex was aspirated using the irrigation aspiration handpiece.  Additional Provisc was placed into the eye to distend the capsular bag for lens placement.  A lens was then injected into the capsular bag.  A small central tear was noted in the posterior capsule prior to lens placement.  This enlarged upon placement of the lens.  Reverse optic capture was performed so that the haptics remained in the capsular bag while the optic was placed in the sulcus, just anterior to the capsulorhexis. The pupil expanding ring was removed using a Kuglen hook and insertion device. The remaining viscoelastic was aspirated from the capsular bag and the anterior chamber.  The anterior chamber was filled with balanced salt solution to inflate to a physiologic pressure.  Wounds were hydrated with balanced salt solution.  The anterior chamber was inflated to a physiologic pressure with balanced salt solution.  No wound leaks were noted.Cefuroxime 0.1 ml of a 10mg /ml solution was injected into the anterior chamber for a dose of 1 mg of intracameral antibiotic at the completion of the case. Timolol and Brimonidine drops were applied to the eye.  The patient was taken to the recovery room in stable condition without complications of anesthesia or surgery.  [name] 11/30/2017, 7:57 AM

## 2017-11-30 NOTE — Anesthesia Postprocedure Evaluation (Signed)
Anesthesia Post Note  Patient: [name].  Procedure(s) Performed: CATARACT EXTRACTION PHACO AND INTRAOCULAR LENS PLACEMENT (IOC) COMPLICATED  RIGHT DIABETIC (Right Eye)  Patient location during evaluation: PACU Anesthesia Type: MAC Level of consciousness: awake and alert and oriented Pain management: satisfactory to patient Vital Signs Assessment: post-procedure vital signs reviewed and stable Respiratory status: spontaneous breathing, nonlabored ventilation and respiratory function stable Cardiovascular status: blood pressure returned to baseline and stable Postop Assessment: Adequate PO intake and No signs of nausea or vomiting Anesthetic complications: no    [name]

## 2017-11-30 NOTE — Anesthesia Preprocedure Evaluation (Signed)
Anesthesia Evaluation  Patient identified by MRN, date of birth, ID band Patient awake    Reviewed: Allergy & Precautions, H&P , NPO status , Patient's Chart, lab work & pertinent test results  Airway Mallampati: II  TM Distance: >3 FB Neck ROM: full    Dental  (+) Lower Dentures, Upper Dentures   Pulmonary COPD, former smoker,    Pulmonary exam normal        Cardiovascular hypertension, + CAD, + Past MI and +CHF  Normal cardiovascular exam+ dysrhythmias Atrial Fibrillation  Rhythm:Irregular Rate:Abnormal     Neuro/Psych Anxiety CVA    GI/Hepatic Neg liver ROS,   Endo/Other  diabetesHypothyroidism   Renal/GU      Musculoskeletal   Abdominal   Peds  Hematology negative hematology ROS (+)   Anesthesia Other Findings   Reproductive/Obstetrics negative OB ROS                             Anesthesia Physical  Anesthesia Plan  ASA: III  Anesthesia Plan: MAC   Post-op Pain Management:    Induction:   PONV Risk Score and Plan: 1 and Treatment may vary due to age or medical condition and Midazolam  Airway Management Planned:   Additional Equipment:   Intra-op Plan:   Post-operative Plan:   Informed Consent: I have reviewed the patients History and Physical, chart, labs and discussed the procedure including the risks, benefits and alternatives for the proposed anesthesia with the patient or authorized representative who has indicated his/her understanding and acceptance.     Plan Discussed with:   Anesthesia Plan Comments:         Anesthesia Quick Evaluation

## 2017-11-30 NOTE — Telephone Encounter (Signed)
No answer. Left message to call back.   

## 2017-12-01 ENCOUNTER — Encounter: Payer: Self-pay | Admitting: Ophthalmology

## 2017-12-06 ENCOUNTER — Encounter: Payer: Medicare Other | Attending: Physician Assistant | Admitting: Physician Assistant

## 2017-12-06 DIAGNOSIS — Z923 Personal history of irradiation: Secondary | ICD-10-CM | POA: Insufficient documentation

## 2017-12-06 DIAGNOSIS — I4891 Unspecified atrial fibrillation: Secondary | ICD-10-CM | POA: Diagnosis not present

## 2017-12-06 DIAGNOSIS — J449 Chronic obstructive pulmonary disease, unspecified: Secondary | ICD-10-CM | POA: Insufficient documentation

## 2017-12-06 DIAGNOSIS — Z9221 Personal history of antineoplastic chemotherapy: Secondary | ICD-10-CM | POA: Diagnosis not present

## 2017-12-06 DIAGNOSIS — Z794 Long term (current) use of insulin: Secondary | ICD-10-CM | POA: Diagnosis not present

## 2017-12-06 DIAGNOSIS — I5042 Chronic combined systolic (congestive) and diastolic (congestive) heart failure: Secondary | ICD-10-CM | POA: Diagnosis not present

## 2017-12-06 DIAGNOSIS — I252 Old myocardial infarction: Secondary | ICD-10-CM | POA: Diagnosis not present

## 2017-12-06 DIAGNOSIS — I110 Hypertensive heart disease with heart failure: Secondary | ICD-10-CM | POA: Insufficient documentation

## 2017-12-06 DIAGNOSIS — G4730 Sleep apnea, unspecified: Secondary | ICD-10-CM | POA: Insufficient documentation

## 2017-12-06 DIAGNOSIS — L97822 Non-pressure chronic ulcer of other part of left lower leg with fat layer exposed: Secondary | ICD-10-CM | POA: Insufficient documentation

## 2017-12-06 DIAGNOSIS — E11622 Type 2 diabetes mellitus with other skin ulcer: Secondary | ICD-10-CM | POA: Insufficient documentation

## 2017-12-06 DIAGNOSIS — I890 Lymphedema, not elsewhere classified: Secondary | ICD-10-CM | POA: Diagnosis not present

## 2017-12-06 DIAGNOSIS — E1140 Type 2 diabetes mellitus with diabetic neuropathy, unspecified: Secondary | ICD-10-CM | POA: Insufficient documentation

## 2017-12-06 DIAGNOSIS — Z7901 Long term (current) use of anticoagulants: Secondary | ICD-10-CM | POA: Insufficient documentation

## 2017-12-12 ENCOUNTER — Other Ambulatory Visit: Payer: Self-pay | Admitting: Cardiovascular Disease

## 2017-12-13 ENCOUNTER — Ambulatory Visit: Payer: Medicare Other | Admitting: Physician Assistant

## 2017-12-13 ENCOUNTER — Telehealth: Payer: Self-pay | Admitting: Cardiovascular Disease

## 2017-12-13 NOTE — Telephone Encounter (Signed)
Called and s/w patient. He denies shortness of breath, chest pain or dizziness. Says he is back at his normal weight of 181lb.

## 2017-12-13 NOTE — Telephone Encounter (Signed)
Nurse Shawn calling from [facility] to give [place] an update on patient   Patient has had 5.4 weight loss in one day He took extra metolazone  He's not experiencing any hypovolemia symptoms    If we could please call patient back about losing the weight

## 2017-12-13 NOTE — Telephone Encounter (Signed)
Per Fax   1) Are you calling to confirm a diagnosis or obtain personal health information (Y/N)? No   2) If so, what information is requested?  None requested .  Received Urgent HF report . Placed in nurse box.   Please route to Medical Records or your medical records site representative

## 2017-12-15 ENCOUNTER — Telehealth: Payer: Self-pay | Admitting: Cardiovascular Disease

## 2017-12-15 NOTE — Telephone Encounter (Signed)
Spoke with patient and he states that he has been feeling fine and no symptoms at this time. He reports weight is down and he has been doing good. He states that they monitor his weights daily and he takes medications when needed for weight increases. He verbalized understanding of our conversation, agreement with plan, and had no further questions or concerns at this time.

## 2017-12-15 NOTE — Telephone Encounter (Signed)
1) Are you calling to confirm a diagnosis or obtain personal health information (Y/N)? NO   2) If so, what information is requested? No giving information     [employer] faxing weight chart. Patient was given fluid pill and has since lost 7 lbs.  Will place in nurse box when received.

## 2017-12-16 ENCOUNTER — Encounter: Payer: Medicare Other | Attending: Physician Assistant | Admitting: Physician Assistant

## 2017-12-16 DIAGNOSIS — Z923 Personal history of irradiation: Secondary | ICD-10-CM | POA: Insufficient documentation

## 2017-12-16 DIAGNOSIS — M1990 Unspecified osteoarthritis, unspecified site: Secondary | ICD-10-CM | POA: Diagnosis not present

## 2017-12-16 DIAGNOSIS — J449 Chronic obstructive pulmonary disease, unspecified: Secondary | ICD-10-CM | POA: Insufficient documentation

## 2017-12-16 DIAGNOSIS — Z9989 Dependence on other enabling machines and devices: Secondary | ICD-10-CM | POA: Insufficient documentation

## 2017-12-16 DIAGNOSIS — L97822 Non-pressure chronic ulcer of other part of left lower leg with fat layer exposed: Secondary | ICD-10-CM | POA: Diagnosis not present

## 2017-12-16 DIAGNOSIS — I5042 Chronic combined systolic (congestive) and diastolic (congestive) heart failure: Secondary | ICD-10-CM | POA: Diagnosis not present

## 2017-12-16 DIAGNOSIS — I890 Lymphedema, not elsewhere classified: Secondary | ICD-10-CM | POA: Insufficient documentation

## 2017-12-16 DIAGNOSIS — G4730 Sleep apnea, unspecified: Secondary | ICD-10-CM | POA: Diagnosis not present

## 2017-12-16 DIAGNOSIS — I4891 Unspecified atrial fibrillation: Secondary | ICD-10-CM | POA: Diagnosis not present

## 2017-12-16 DIAGNOSIS — Z87891 Personal history of nicotine dependence: Secondary | ICD-10-CM | POA: Insufficient documentation

## 2017-12-16 DIAGNOSIS — Z8673 Personal history of transient ischemic attack (TIA), and cerebral infarction without residual deficits: Secondary | ICD-10-CM | POA: Diagnosis not present

## 2017-12-16 DIAGNOSIS — Z7901 Long term (current) use of anticoagulants: Secondary | ICD-10-CM | POA: Diagnosis not present

## 2017-12-16 DIAGNOSIS — I110 Hypertensive heart disease with heart failure: Secondary | ICD-10-CM | POA: Diagnosis not present

## 2017-12-16 DIAGNOSIS — E1140 Type 2 diabetes mellitus with diabetic neuropathy, unspecified: Secondary | ICD-10-CM | POA: Diagnosis not present

## 2017-12-16 DIAGNOSIS — I252 Old myocardial infarction: Secondary | ICD-10-CM | POA: Insufficient documentation

## 2017-12-16 DIAGNOSIS — E11622 Type 2 diabetes mellitus with other skin ulcer: Secondary | ICD-10-CM | POA: Insufficient documentation

## 2017-12-16 DIAGNOSIS — Z8249 Family history of ischemic heart disease and other diseases of the circulatory system: Secondary | ICD-10-CM | POA: Diagnosis not present

## 2017-12-17 ENCOUNTER — Other Ambulatory Visit: Payer: Self-pay | Admitting: Internal Medicine

## 2017-12-19 ENCOUNTER — Encounter: Payer: Self-pay | Admitting: Internal Medicine

## 2017-12-19 ENCOUNTER — Ambulatory Visit (INDEPENDENT_AMBULATORY_CARE_PROVIDER_SITE_OTHER): Payer: Medicare Other | Admitting: Internal Medicine

## 2017-12-19 VITALS — BP 106/74 | HR 61 | Temp 97.8°F | Wt 184.0 lb

## 2017-12-19 DIAGNOSIS — L03114 Cellulitis of left upper limb: Secondary | ICD-10-CM

## 2017-12-19 MED ORDER — CEPHALEXIN 250 MG PO CAPS: 250.0000 mg | ORAL_CAPSULE | Freq: Three times a day (TID) | ORAL | 0 refills | Status: DC

## 2017-12-19 NOTE — Progress Notes (Signed)
OLOF, MARCIL ([id number]) Visit Report for 12/16/2017 Arrival Information Details Patient Name: [name], [name]. Date of Service: 12/16/2017 12:45 PM Medical Record Number: [id number] Patient Account Number: [id number] Date of Birth/Sex: 10-29-1931 (84 y.o. M) Treating RN: Montey Hora Primary Care Honey Zakarian: Webb Silversmith Other Clinician: Referring Cherrelle Plante: Webb Silversmith Treating Georgia Delsignore/Extender: Melburn Hake, HOYT Weeks in Treatment: 1 Visit Information History Since Last Visit Added or deleted any medications: No Patient Arrived: Walker Any new allergies or adverse reactions: No Arrival Time: 12:46 Had a fall or experienced change in Yes Accompanied By: daughter activities of daily living that may affect Transfer Assistance: None risk of falls: Patient Identification Verified: Yes Signs or symptoms of abuse/neglect since last visito No Secondary Verification Process Yes Hospitalized since last visit: No Completed: Implantable device outside of the clinic excluding No Patient Has Alerts: Yes cellular tissue based products placed in the center Patient Alerts: Patient on Blood since last visit: Thinner Has Dressing in Place as Prescribed: Yes Eliquis Pain Present Now: Yes DMII ABI [place] BILATERAL Electronic Signature(s) Signed: 12/16/2017 5:00:18 PM By: Lorine Bears RCP, RRT, CHT Entered By: Becky Sax, Amado Nash on 12/16/2017 12:47:17 Varone, Gavin Pound ([id number]) -------------------------------------------------------------------------------- Clinic Level of Care Assessment Details Patient Name: Shawn Bark T. Date of Service: 12/16/2017 12:45 PM Medical Record Number: [id number] Patient Account Number: [id number] Date of Birth/Sex: 09/07/1931 (84 y.o. M) Treating RN: Montey Hora Primary Care Rutger Salton: Webb Silversmith Other Clinician: Referring Jelesa Mangini: Webb Silversmith Treating Meredith Mells/Extender: Melburn Hake, HOYT Weeks in Treatment: 1 Clinic  Level of Care Assessment Items TOOL 4 Quantity Score []  - Use when only an EandM is performed on FOLLOW-UP visit 0 ASSESSMENTS - Nursing Assessment / Reassessment X - Reassessment of Co-morbidities (includes updates in patient status) 1 10 X- 1 5 Reassessment of Adherence to Treatment Plan ASSESSMENTS - Wound and Skin Assessment / Reassessment []  - Simple Wound Assessment / Reassessment - one wound 0 X- 2 5 Complex Wound Assessment / Reassessment - multiple wounds []  - 0 Dermatologic / Skin Assessment (not related to wound area) ASSESSMENTS - Focused Assessment X - Circumferential Edema Measurements - multi extremities 1 5 []  - 0 Nutritional Assessment / Counseling / Intervention X- 1 5 Lower Extremity Assessment (monofilament, tuning fork, pulses) []  - 0 Peripheral Arterial Disease Assessment (using hand held doppler) ASSESSMENTS - Ostomy and/or Continence Assessment and Care []  - Incontinence Assessment and Management 0 []  - 0 Ostomy Care Assessment and Management (repouching, etc.) PROCESS - Coordination of Care X - Simple Patient / Family Education for ongoing care 1 15 []  - 0 Complex (extensive) Patient / Family Education for ongoing care []  - 0 Staff obtains Programmer, systems, Records, Test Results / Process Orders []  - 0 Staff telephones HHA, Nursing Homes / Clarify orders / etc []  - 0 Routine Transfer to another Facility (non-emergent condition) []  - 0 Routine Hospital Admission (non-emergent condition) []  - 0 New Admissions / Biomedical engineer / Ordering NPWT, Apligraf, etc. []  - 0 Emergency Hospital Admission (emergent condition) X- 1 10 Simple Discharge Coordination Shawn, [name] T. ([id number]) []  - 0 Complex (extensive) Discharge Coordination PROCESS - Special Needs []  - Pediatric / Minor Patient Management 0 []  - 0 Isolation Patient Management []  - 0 Hearing / Language / Visual special needs []  - 0 Assessment of Community assistance (transportation,  D/C planning, etc.) []  - 0 Additional assistance / Altered mentation []  - 0 Support Surface(s) Assessment (bed, cushion, seat, etc.) INTERVENTIONS - Wound Cleansing / Measurement []  - Simple Wound  Cleansing - one wound 0 X- 2 5 Complex Wound Cleansing - multiple wounds X- 1 5 Wound Imaging (photographs - any number of wounds) []  - 0 Wound Tracing (instead of photographs) []  - 0 Simple Wound Measurement - one wound X- 2 5 Complex Wound Measurement - multiple wounds INTERVENTIONS - Wound Dressings X - Small Wound Dressing one or multiple wounds 2 10 []  - 0 Medium Wound Dressing one or multiple wounds []  - 0 Large Wound Dressing one or multiple wounds []  - 0 Application of Medications - topical []  - 0 Application of Medications - injection INTERVENTIONS - Miscellaneous []  - External ear exam 0 []  - 0 Specimen Collection (cultures, biopsies, blood, body fluids, etc.) []  - 0 Specimen(s) / Culture(s) sent or taken to Lab for analysis []  - 0 Patient Transfer (multiple staff / Civil Service fast streamer / Similar devices) []  - 0 Simple Staple / Suture removal (25 or less) []  - 0 Complex Staple / Suture removal (26 or more) []  - 0 Hypo / Hyperglycemic Management (close monitor of Blood Glucose) []  - 0 Ankle / Brachial Index (ABI) - do not check if billed separately X- 1 5 Vital Signs Shawn, Alexes T. ([id number]) Has the patient been seen at the hospital within the last three years: Yes Total Score: 110 Level Of Care: New/Established - Level 3 Electronic Signature(s) Signed: 12/16/2017 5:28:07 PM By: Montey Hora Entered By: Montey Hora on 12/16/2017 13:32:28 [name] ([id number]) -------------------------------------------------------------------------------- Encounter Discharge Information Details Patient Name: Shawn Bark T. Date of Service: 12/16/2017 12:45 PM Medical Record Number: [id number] Patient Account Number: [id number] Date of Birth/Sex: April 07, 1932 (84 y.o.  M) Treating RN: Cornell Barman Primary Care Jaidee Stipe: Webb Silversmith Other Clinician: Referring Irina Okelly: Webb Silversmith Treating Baillie Mohammad/Extender: Melburn Hake, HOYT Weeks in Treatment: 1 Encounter Discharge Information Items Discharge Condition: Stable Ambulatory Status: Walker Discharge Destination: Home Transportation: Private Auto Accompanied By: daughter Schedule Follow-up Appointment: Yes Clinical Summary of Care: Electronic Signature(s) Signed: 12/16/2017 3:13:35 PM By: Gretta Cool, BSN, RN, CWS, Kim RN, BSN Entered By: Gretta Cool, BSN, RN, CWS, Kim on 12/16/2017 13:45:13 [name] ([id number]) -------------------------------------------------------------------------------- Lower Extremity Assessment Details Patient Name: OLAF, MESA T. Date of Service: 12/16/2017 12:45 PM Medical Record Number: [id number] Patient Account Number: [id number] Date of Birth/Sex: 10/02/1931 (84 y.o. M) Treating RN: Secundino Ginger Primary Care Keyvin Rison: Webb Silversmith Other Clinician: Referring Adelae Yodice: Webb Silversmith Treating Saia Derossett/Extender: Melburn Hake, HOYT Weeks in Treatment: 1 Edema Assessment Assessed: [Left: No] [Right: No] Edema: [Left: N] [Right: o] Calf Left: Right: Point of Measurement: 36 cm From Medial Instep 32 cm cm Ankle Left: Right: Point of Measurement: 12 cm From Medial Instep 22 cm cm Vascular Assessment Claudication: Claudication Assessment [Left:None] Pulses: Dorsalis Pedis Palpable: [Left:Yes] Posterior Tibial Extremity colors, hair growth, and conditions: Extremity Color: [Left:Red] Hair Growth on Extremity: [Left:No] Temperature of Extremity: [Left:Cool] Capillary Refill: [Left:< 3 seconds] Toe Nail Assessment Left: Right: Thick: Yes Discolored: Yes Deformed: Yes Improper Length and Hygiene: No Electronic Signature(s) Signed: 12/16/2017 4:32:43 PM By: Secundino Ginger Entered By: Secundino Ginger on 12/16/2017 13:05:30 Wile, Gavin Pound  ([id number]) -------------------------------------------------------------------------------- Multi Wound Chart Details Patient Name: Shawn Bark T. Date of Service: 12/16/2017 12:45 PM Medical Record Number: [id number] Patient Account Number: [id number] Date of Birth/Sex: 1932-03-15 (84 y.o. M) Treating RN: Montey Hora Primary Care Christa Fasig: Webb Silversmith Other Clinician: Referring Khair Chasteen: Webb Silversmith Treating Sarabella Caprio/Extender: STONE III, HOYT Weeks in Treatment: 1 Vital Signs Height(in): 66 Pulse(bpm): 57 Weight(lbs): 182 Blood Pressure(mmHg): 105/62 Body Mass Index(BMI): 29 Temperature(F):  97.7 Respiratory Rate 20 (breaths/min): Photos: [N/A:N/A] Wound Location: Left Lower Leg - Lateral, Left Lower Leg - Lateral, Distal N/A Proximal Wounding Event: Gradually Appeared Gradually Appeared N/A Primary Etiology: Diabetic Wound/Ulcer of the Diabetic Wound/Ulcer of the N/A Lower Extremity Lower Extremity Comorbid History: Chronic Obstructive Chronic Obstructive N/A Pulmonary Disease (COPD), Pulmonary Disease (COPD), Sleep Apnea, Arrhythmia, Sleep Apnea, Arrhythmia, Congestive Heart Failure, Congestive Heart Failure, Hypertension, Myocardial Hypertension, Myocardial Infarction, Type II Diabetes, Infarction, Type II Diabetes, Osteoarthritis, Neuropathy, Osteoarthritis, Neuropathy, Received Radiation Received Radiation Date Acquired: 09/05/2017 09/05/2017 N/A Weeks of Treatment: 1 1 N/A Wound Status: Open Open N/A Clustered Wound: Yes No N/A Clustered Quantity: 2 N/A N/A Measurements L x W x D 2.7x2.1x0.1 0.8x0.5x0.1 N/A (cm) Area (cm) : 4.453 0.314 N/A Volume (cm) : 0.445 0.031 N/A % Reduction in Area: 36.40% 77.80% N/A % Reduction in Volume: 36.40% 78.00% N/A Classification: Grade 1 Grade 1 N/A Exudate Amount: Medium Large N/A Exudate Type: Serous Serous N/A Exudate Color: amber amber N/A Wound Margin: Flat and Intact Flat and Intact N/A Lish, Zayvon T.  ([id number]) Granulation Amount: Medium (34-66%) Small (1-33%) N/A Granulation Quality: Pink Red N/A Necrotic Amount: Medium (34-66%) Small (1-33%) N/A Exposed Structures: Fat Layer (Subcutaneous Fascia: No N/A Tissue) Exposed: Yes Fat Layer (Subcutaneous Fascia: No Tissue) Exposed: No Tendon: No Tendon: No Muscle: No Muscle: No Joint: No Joint: No Bone: No Bone: No Epithelialization: None None N/A Periwound Skin Texture: Excoriation: No No Abnormalities Noted N/A Induration: No Callus: No Crepitus: No Rash: No Scarring: No Periwound Skin Moisture: Maceration: No Maceration: No N/A Dry/Scaly: No Dry/Scaly: No Periwound Skin Color: Hemosiderin Staining: Yes Erythema: Yes N/A Atrophie Blanche: No Cyanosis: No Ecchymosis: No Erythema: No Mottled: No Pallor: No Rubor: No Temperature: No Abnormality No Abnormality N/A Tenderness on Palpation: Yes Yes N/A Wound Preparation: Ulcer Cleansing: Ulcer Cleansing: N/A Rinsed/Irrigated with Saline Rinsed/Irrigated with Saline Topical Anesthetic Applied: Topical Anesthetic Applied: Other: lidocaine 4% Other: lidocaine 4% Assessment Notes: some circumferential redness N/A N/A noted around Lt. lower extremity. Treatment Notes Electronic Signature(s) Signed: 12/16/2017 5:28:07 PM By: Montey Hora Entered By: Montey Hora on 12/16/2017 13:27:28 [name] ([id number]) -------------------------------------------------------------------------------- [place] Details Patient Name: Shawn Bark T. Date of Service: 12/16/2017 12:45 PM Medical Record Number: [id number] Patient Account Number: [id number] Date of Birth/Sex: [birth year] (84 y.o. M) Treating RN: Montey Hora Primary Care Shavanna Furnari: Webb Silversmith Other Clinician: Referring Briasia Flinders: Webb Silversmith Treating Yanilen Adamik/Extender: Melburn Hake, HOYT Weeks in Treatment: 1 Active Inactive ` Abuse / Safety / Falls / Self Care Management Nursing  Diagnoses: Potential for falls Goals: Patient will not experience any injury related to falls Date Initiated: 12/06/2017 Target Resolution Date: 03/18/2018 Goal Status: Active Interventions: Assess Activities of Daily Living upon admission and as needed Assess fall risk on admission and as needed Assess: immobility, friction, shearing, incontinence upon admission and as needed Assess impairment of mobility on admission and as needed per policy Assess personal safety and home safety (as indicated) on admission and as needed Assess self care needs on admission and as needed Notes: ` Nutrition Nursing Diagnoses: Imbalanced nutrition Impaired glucose control: actual or potential Potential for alteratiion in Nutrition/Potential for imbalanced nutrition Goals: Patient/caregiver agrees to and verbalizes understanding of need to use nutritional supplements and/or vitamins as prescribed Date Initiated: 12/06/2017 Target Resolution Date: 03/18/2018 Goal Status: Active Patient/caregiver will maintain therapeutic glucose control Date Initiated: 12/06/2017 Target Resolution Date: 03/18/2018 Goal Status: Active Interventions: Assess patient nutrition upon admission and as needed per policy  Provide education on elevated blood sugars and impact on wound healing Provide education on nutrition ROBER, SKEELS ([id number]) Notes: ` Orientation to the Wound Care Program Nursing Diagnoses: Knowledge deficit related to the wound healing center program Goals: Patient/caregiver will verbalize understanding of the [place] Date Initiated: 12/06/2017 Target Resolution Date: 12/17/2017 Goal Status: Active Interventions: Provide education on orientation to the wound center Notes: ` Wound/Skin Impairment Nursing Diagnoses: Impaired tissue integrity Knowledge deficit related to ulceration/compromised skin integrity Goals: Ulcer/skin breakdown will have a volume reduction of 80%  by week 12 Date Initiated: 12/06/2017 Target Resolution Date: 03/11/2018 Goal Status: Active Interventions: Assess patient/caregiver ability to perform ulcer/skin care regimen upon admission and as needed Assess ulceration(s) every visit Notes: Electronic Signature(s) Signed: 12/16/2017 5:28:07 PM By: Montey Hora Entered By: Montey Hora on 12/16/2017 13:27:16 Riolo, Gavin Pound ([id number]) -------------------------------------------------------------------------------- Pain Assessment Details Patient Name: Shawn Bark T. Date of Service: 12/16/2017 12:45 PM Medical Record Number: [id number] Patient Account Number: [id number] Date of Birth/Sex: 08/08/1931 (84 y.o. M) Treating RN: Montey Hora Primary Care Omari Mcmanaway: Webb Silversmith Other Clinician: Referring Louna Rothgeb: Webb Silversmith Treating Keith Felten/Extender: Melburn Hake, HOYT Weeks in Treatment: 1 Active Problems Location of Pain Severity and Description of Pain Patient Has Paino Yes Site Locations Rate the pain. Current Pain Level: 0 Worst Pain Level: 6 Least Pain Level: 1 Pain Management and Medication Current Pain Management: Electronic Signature(s) Signed: 12/16/2017 5:00:18 PM By: Paulla Fore, RRT, CHT Signed: 12/16/2017 5:28:07 PM By: Montey Hora Entered By: Lorine Bears on 12/16/2017 12:47:42 [name] ([id number]) -------------------------------------------------------------------------------- Patient/Caregiver Education Details Patient Name: Shawn Bark T. Date of Service: 12/16/2017 12:45 PM Medical Record Number: [id number] Patient Account Number: [id number] Date of Birth/Gender: [birth year] (84 y.o. M) Treating RN: Cornell Barman Primary Care Physician: Webb Silversmith Other Clinician: Referring Physician: Webb Silversmith Treating Physician/Extender: Sharalyn Ink in Treatment: 1 Education Assessment Education Provided To: Patient Education Topics  Provided Wound/Skin Impairment: Handouts: Caring for Your Ulcer Methods: Demonstration, Explain/Verbal Responses: State content correctly Electronic Signature(s) Signed: 12/16/2017 3:13:35 PM By: Gretta Cool, BSN, RN, CWS, Kim RN, BSN Entered By: Gretta Cool, BSN, RN, CWS, Kim on 12/16/2017 13:45:24 [name] ([id number]) -------------------------------------------------------------------------------- Wound Assessment Details Patient Name: Shawn Bark T. Date of Service: 12/16/2017 12:45 PM Medical Record Number: [id number] Patient Account Number: [id number] Date of Birth/Sex: 1931-12-21 (84 y.o. M) Treating RN: Secundino Ginger Primary Care Kemarion Abbey: Webb Silversmith Other Clinician: Referring Yaritsa Savarino: Webb Silversmith Treating Vetra Shinall/Extender: STONE III, HOYT Weeks in Treatment: 1 Wound Status Wound Number: 1 Primary Diabetic Wound/Ulcer of the Lower Extremity Etiology: Wound Location: Left Lower Leg - Lateral, Proximal Wound Open Wounding Event: Gradually Appeared Status: Date Acquired: 09/05/2017 Comorbid Chronic Obstructive Pulmonary Disease (COPD), Weeks Of Treatment: 1 History: Sleep Apnea, Arrhythmia, Congestive Heart Clustered Wound: Yes Failure, Hypertension, Myocardial Infarction, Type II Diabetes, Osteoarthritis, Neuropathy, Received Radiation Photos Photo Uploaded By: Secundino Ginger on 12/16/2017 13:12:42 Wound Measurements Length: (cm) 2.7 Width: (cm) 2.1 Depth: (cm) 0.1 Clustered Quantity: 2 Area: (cm) 4.453 Volume: (cm) 0.445 % Reduction in Area: 36.4% % Reduction in Volume: 36.4% Epithelialization: None Tunneling: No Undermining: No Wound Description Classification: Grade 1 Foul Od Wound Margin: Flat and Intact Slough/ Exudate Amount: Medium Exudate Type: Serous Exudate Color: amber or After Cleansing: No Fibrino Yes Wound Bed Granulation Amount: Medium (34-66%) Exposed Structure Granulation Quality: Pink Fascia Exposed: No Necrotic Amount: Medium  (34-66%) Fat Layer (Subcutaneous Tissue) Exposed: Yes Necrotic Quality: Adherent Slough Tendon Exposed: No Muscle Exposed: No  Joint Exposed: No Hocevar, Yakir T. ([id number]) Bone Exposed: No Periwound Skin Texture Texture Color No Abnormalities Noted: No No Abnormalities Noted: No Callus: No Atrophie Blanche: No Crepitus: No Cyanosis: No Excoriation: No Ecchymosis: No Induration: No Erythema: No Rash: No Hemosiderin Staining: Yes Scarring: No Mottled: No Pallor: No Moisture Rubor: No No Abnormalities Noted: No Dry / Scaly: No Temperature / Pain Maceration: No Temperature: No Abnormality Tenderness on Palpation: Yes Wound Preparation Ulcer Cleansing: Rinsed/Irrigated with Saline Topical Anesthetic Applied: Other: lidocaine 4%, Assessment Notes some circumferential redness noted around Lt. lower extremity. Treatment Notes Wound #1 (Left, Proximal, Lateral Lower Leg) Notes silvercell, ABD, conform and tubigrip Electronic Signature(s) Signed: 12/16/2017 4:32:43 PM By: Secundino Ginger Entered By: Secundino Ginger on 12/16/2017 13:02:16 Broden, Gavin Pound ([id number]) -------------------------------------------------------------------------------- Wound Assessment Details Patient Name: Shawn Bark T. Date of Service: 12/16/2017 12:45 PM Medical Record Number: [id number] Patient Account Number: [id number] Date of Birth/Sex: [birth year] (84 y.o. M) Treating RN: Secundino Ginger Primary Care Khaliel Morey: Webb Silversmith Other Clinician: Referring Jahn Franchini: Webb Silversmith Treating Britton Perkinson/Extender: STONE III, HOYT Weeks in Treatment: 1 Wound Status Wound Number: 2 Primary Diabetic Wound/Ulcer of the Lower Extremity Etiology: Wound Location: Left Lower Leg - Lateral, Distal Wound Open Wounding Event: Gradually Appeared Status: Date Acquired: 09/05/2017 Comorbid Chronic Obstructive Pulmonary Disease (COPD), Weeks Of Treatment: 1 History: Sleep Apnea, Arrhythmia, Congestive Heart Clustered  Wound: No Failure, Hypertension, Myocardial Infarction, Type II Diabetes, Osteoarthritis, Neuropathy, Received Radiation Photos Photo Uploaded By: Secundino Ginger on 12/16/2017 13:12:43 Wound Measurements Length: (cm) 0.8 % Reducti Width: (cm) 0.5 % Reducti Depth: (cm) 0.1 Epithelia Area: (cm) 0.314 Tunnelin Volume: (cm) 0.031 Undermin on in Area: 77.8% on in Volume: 78% lization: None g: No ing: No Wound Description Classification: Grade 1 Foul Odor Wound Margin: Flat and Intact Slough/Fi Exudate Amount: Large Exudate Type: Serous Exudate Color: amber After Cleansing: No brino Yes Wound Bed Granulation Amount: Small (1-33%) Exposed Structure Granulation Quality: Red Fascia Exposed: No Necrotic Amount: Small (1-33%) Fat Layer (Subcutaneous Tissue) Exposed: No Necrotic Quality: Adherent Slough Tendon Exposed: No Muscle Exposed: No Joint Exposed: No Bone Exposed: No Salvucci, Chales T. ([id number]) Periwound Skin Texture Texture Color No Abnormalities Noted: Yes No Abnormalities Noted: No Erythema: Yes Moisture No Abnormalities Noted: No Temperature / Pain Dry / Scaly: No Temperature: No Abnormality Maceration: No Tenderness on Palpation: Yes Wound Preparation Ulcer Cleansing: Rinsed/Irrigated with Saline Topical Anesthetic Applied: Other: lidocaine 4%, Treatment Notes Wound #2 (Left, Distal, Lateral Lower Leg) Notes silvercell, ABD, conform and tubigrip Electronic Signature(s) Signed: 12/16/2017 4:32:43 PM By: Secundino Ginger Entered By: Secundino Ginger on 12/16/2017 13:03:59 Borchers, Gavin Pound ([id number]) -------------------------------------------------------------------------------- Vitals Details Patient Name: Shawn Bark T. Date of Service: 12/16/2017 12:45 PM Medical Record Number: [id number] Patient Account Number: [id number] Date of Birth/Sex: 1931-09-05 (84 y.o. M) Treating RN: Montey Hora Primary Care Armani Brar: Webb Silversmith Other Clinician: Referring  Anneth Brunell: Webb Silversmith Treating Quanah Majka/Extender: Melburn Hake, HOYT Weeks in Treatment: 1 Vital Signs Time Taken: 12:37 Temperature (F): 97.7 Height (in): 66 Pulse (bpm): 57 Weight (lbs): 182 Respiratory Rate (breaths/min): 20 Body Mass Index (BMI): 29.4 Blood Pressure (mmHg): 105/62 Reference Range: 80 - 120 mg / dl Electronic Signature(s) Signed: 12/16/2017 5:00:18 PM By: Lorine Bears RCP, RRT, CHT Entered By: Lorine Bears on 12/16/2017 12:50:26

## 2017-12-19 NOTE — Progress Notes (Signed)
Shawn, [name] ([id number]) Visit Report for 12/16/2017 Chief Complaint Document Details Patient Name: [name], [name]. Date of Service: 12/16/2017 12:45 PM Medical Record Number: [id number] Patient Account Number: [id number] Date of Birth/Sex: [birth year] (84 y.o. M) Treating RN: Montey Hora Primary Care Provider: Webb Silversmith Other Clinician: Referring Provider: Webb Silversmith Treating Provider/Extender: Melburn Hake, Robson Trickey Weeks in Treatment: 1 Information Obtained from: Patient Chief Complaint Left lateral LE ulcer Electronic Signature(s) Signed: 12/18/2017 9:18:35 AM By: Worthy Keeler PA-C Entered By: Worthy Keeler on 12/16/2017 12:55:15 Stoffel, Gavin Pound ([id number]) -------------------------------------------------------------------------------- HPI Details Patient Name: [name] T. Date of Service: 12/16/2017 12:45 PM Medical Record Number: [id number] Patient Account Number: [id number] Date of Birth/Sex: 1931/06/03 (84 y.o. M) Treating RN: Montey Hora Primary Care Provider: Webb Silversmith Other Clinician: Referring Provider: Webb Silversmith Treating Provider/Extender: Melburn Hake, Isack Lavalley Weeks in Treatment: 1 History of Present Illness Associated Signs and Symptoms: Patient has a history of lymphedema, type II diabetes mellitus, long-term use of Eliquis, congestive heart failure, and COPD. HPI Description: 12/06/17 on evaluation today patient is here for evaluation of an ulcer on the left lateral lower extremity which has been present for roughly 2-3 months. Currently he has been using silver alginate and it was considered putting the patient in an [employer] wrap. However he did have arterial studies which actually showed to be fairly normal on the right however on the left he did have an ABI of 0.75. With that being said this ABI was consistent with at least mild-to-moderate arterial disease. The patient actually has some question on the test as to whether or not this result  was actually even accurate his arterial disease could be more significant. For that reason I think he may need to see a vascular specialist to see about possibly undergoing an angiogram to see if arterial flow could be improved. With that being said it does appear that the patient rather desperately needs compression therapy he has significant edema of the bilateral lower extremities noted during evaluation today. No fevers, chills, nausea, or vomiting noted at this time. 12/16/17 on evaluation today patient actually appears to be doing better in regard to his lower extremity ulcers of the left lower extremity. Both wounds are significantly smaller and improved compared to the initial evaluation last week. Overall I'm very pleased with the progress at this time. The patient likewise is pleased with how things appear. He states that he has thought about it and really does not want to have an angiogram that would prefer not to go see the vascular specialist which we had recommended last week. He states when he had this previous on top of the fact that he had issues with getting the groin area to stop bleeding he states that the die also seemed to cause issues in fact he states through him into having another heart attack. Obviously he has good reasons to want to try to avoid this which I completely understand. Electronic Signature(s) Signed: 12/18/2017 9:18:35 AM By: Worthy Keeler PA-C Entered By: Worthy Keeler on 12/16/2017 13:38:04 [name] ([id number]) -------------------------------------------------------------------------------- Physical Exam Details Patient Name: [name] T. Date of Service: 12/16/2017 12:45 PM Medical Record Number: [id number] Patient Account Number: [id number] Date of Birth/Sex: May 18, 1931 (84 y.o. M) Treating RN: Montey Hora Primary Care Provider: Webb Silversmith Other Clinician: Referring Provider: Webb Silversmith Treating Provider/Extender: STONE III,  Lotoya Casella Weeks in Treatment: 1 Constitutional Well-nourished and well-hydrated in no acute distress. Respiratory normal breathing without difficulty. clear to  auscultation bilaterally. Cardiovascular regular rate and rhythm with normal S1, S2. 2+ pitting edema of the bilateral lower extremities. Psychiatric this patient is able to make decisions and demonstrates good insight into disease process. Alert and Oriented x 3. pleasant and cooperative. Notes Patient's wounds currently did not require any sharp debridement I did mechanically debride away slough utilizing saline and gauze which he tolerated without complication. He still does have swelling noted of the left lower extremity though fortunately it's not quite as bad I can feel a faint pulse despite the fact he still does have swelling he's been trying to keep his legs elevated which is excellent. That's exactly what he needs to do at this point in my pinion. Electronic Signature(s) Signed: 12/18/2017 9:18:35 AM By: Worthy Keeler PA-C Entered By: Worthy Keeler on 12/16/2017 13:38:45 Pudlo, Gavin Pound ([id number]) -------------------------------------------------------------------------------- Physician Orders Details Patient Name: [name] T. Date of Service: 12/16/2017 12:45 PM Medical Record Number: [id number] Patient Account Number: [id number] Date of Birth/Sex: January 23, 1932 (84 y.o. M) Treating RN: Montey Hora Primary Care Provider: Webb Silversmith Other Clinician: Referring Provider: Webb Silversmith Treating Provider/Extender: Melburn Hake, Erby Sanderson Weeks in Treatment: 1 Verbal / Phone Orders: No Diagnosis Coding ICD-10 Coding Code Description I89.0 Lymphedema, not elsewhere classified L97.822 Non-pressure chronic ulcer of other part of left lower leg with fat layer exposed E11.622 Type 2 diabetes mellitus with other skin ulcer Z79.01 Long term (current) use of anticoagulants I50.42 Chronic combined systolic (congestive) and  diastolic (congestive) heart failure J44.9 Chronic obstructive pulmonary disease, unspecified Wound Cleansing Wound #1 Left,Proximal,Lateral Lower Leg o Clean wound with Normal Saline. o Cleanse wound with mild soap and water o May Shower, gently pat wound dry prior to applying new dressing. Wound #2 Left,Distal,Lateral Lower Leg o Clean wound with Normal Saline. o Cleanse wound with mild soap and water o May Shower, gently pat wound dry prior to applying new dressing. Anesthetic (add to Medication List) Wound #1 Left,Proximal,Lateral Lower Leg o Topical Lidocaine 4% cream applied to wound bed prior to debridement (In Clinic Only). Wound #2 Left,Distal,Lateral Lower Leg o Topical Lidocaine 4% cream applied to wound bed prior to debridement (In Clinic Only). Primary Wound Dressing Wound #1 Left,Proximal,Lateral Lower Leg o Silver Alginate Wound #2 Left,Distal,Lateral Lower Leg o Silver Alginate Secondary Dressing Wound #1 Left,Proximal,Lateral Lower Leg o ABD pad o Conform/Kerlix Wound #2 Left,Distal,Lateral Lower Leg o ABD pad o Conform/Kerlix Kohlmeyer, Clearence T. ([id number]) Dressing Change Frequency Wound #1 Left,Proximal,Lateral Lower Leg o Change dressing every other day. o Other: - as needed Wound #2 Left,Distal,Lateral Lower Leg o Change dressing every other day. o Other: - as needed Follow-up Appointments Wound #1 Left,Proximal,Lateral Lower Leg o Return Appointment in 2 weeks. Wound #2 Left,Distal,Lateral Lower Leg o Return Appointment in 2 weeks. Edema Control Wound #1 Left,Proximal,Lateral Lower Leg o Elevate legs to the level of the heart and pump ankles as often as possible o Other: - tubigrip Wound #2 Left,Distal,Lateral Lower Leg o Elevate legs to the level of the heart and pump ankles as often as possible o Other: - tubigrip Additional Orders / Instructions Wound #1 Left,Proximal,Lateral Lower Leg o  Increase protein intake. Wound #2 Left,Distal,Lateral Lower Leg o Increase protein intake. Home Health Wound #1 Left,Proximal,Lateral Lower Leg o [place] Visits o Home Health Nurse may visit PRN to address patientos wound care needs. o FACE TO FACE ENCOUNTER: MEDICARE and MEDICAID PATIENTS: I certify that this patient is under my care and that I had a  face-to-face encounter that meets the physician face-to-face encounter requirements with this patient on this date. The encounter with the patient was in whole or in part for the following MEDICAL CONDITION: (primary reason for [place]) MEDICAL NECESSITY: I certify, that based on my findings, NURSING services are a medically necessary home health service. HOME BOUND STATUS: I certify that my clinical findings support that this patient is homebound (i.e., Due to illness or injury, pt requires aid of supportive devices such as crutches, cane, wheelchairs, walkers, the use of special transportation or the assistance of another person to leave their place of residence. There is a normal inability to leave the home and doing so requires considerable and taxing effort. Other absences are for medical reasons / religious services and are infrequent or of short duration when for other reasons). o If current dressing causes regression in wound condition, may D/C ordered dressing product/s and apply Normal Saline Moist Dressing daily until next [place] / Other MD appointment. [place] of regression in wound condition at [phone]. o Please direct any NON-WOUND related issues/requests for orders to patient's Primary Care Physician Wound #2 Left,Distal,Lateral Lower Leg o [place] Visits o Home Health Nurse may visit PRN to address patientos wound care needs. LODEN, LAURENT ([id number]) o FACE TO FACE ENCOUNTER: MEDICARE and MEDICAID PATIENTS: I certify that this  patient is under my care and that I had a face-to-face encounter that meets the physician face-to-face encounter requirements with this patient on this date. The encounter with the patient was in whole or in part for the following MEDICAL CONDITION: (primary reason for [place]) MEDICAL NECESSITY: I certify, that based on my findings, NURSING services are a medically necessary home health service. HOME BOUND STATUS: I certify that my clinical findings support that this patient is homebound (i.e., Due to illness or injury, pt requires aid of supportive devices such as crutches, cane, wheelchairs, walkers, the use of special transportation or the assistance of another person to leave their place of residence. There is a normal inability to leave the home and doing so requires considerable and taxing effort. Other absences are for medical reasons / religious services and are infrequent or of short duration when for other reasons). o If current dressing causes regression in wound condition, may D/C ordered dressing product/s and apply Normal Saline Moist Dressing daily until next [place] / Other MD appointment. [place] of regression in wound condition at [phone]. o Please direct any NON-WOUND related issues/requests for orders to patient's Primary Care Physician Electronic Signature(s) Signed: 12/16/2017 5:28:07 PM By: Montey Hora Signed: 12/18/2017 9:18:35 AM By: Worthy Keeler PA-C Entered By: Montey Hora on 12/16/2017 13:32:51 Kishi, Gavin Pound ([id number]) -------------------------------------------------------------------------------- Problem List Details Patient Name: LENNARD, CAPEK T. Date of Service: 12/16/2017 12:45 PM Medical Record Number: [id number] Patient Account Number: [id number] Date of Birth/Sex: 06-09-1931 (84 y.o. M) Treating RN: Montey Hora Primary Care Provider: Webb Silversmith Other Clinician: Referring Provider: Webb Silversmith Treating Provider/Extender: Melburn Hake, Bless Lisenby Weeks in Treatment: 1 Active Problems ICD-10 Evaluated Encounter Code Description Active Date Today Diagnosis I89.0 Lymphedema, not elsewhere classified 12/06/2017 No Yes L97.822 Non-pressure chronic ulcer of other part of left lower leg with 12/06/2017 No Yes fat layer exposed E11.622 Type 2 diabetes mellitus with other skin ulcer 12/06/2017 No Yes Z79.01 Long term (current) use of anticoagulants 12/06/2017 No Yes I50.42 Chronic combined systolic (congestive) and diastolic 6/83/4196 No Yes (congestive) heart failure J44.9  Chronic obstructive pulmonary disease, unspecified 12/06/2017 No Yes Inactive Problems Resolved Problems Electronic Signature(s) Signed: 12/18/2017 9:18:35 AM By: Worthy Keeler PA-C Entered By: Worthy Keeler on 12/16/2017 12:55:02 [place], Gavin Pound ([id number]) -------------------------------------------------------------------------------- Progress Note Details Patient Name: [name] T. Date of Service: 12/16/2017 12:45 PM Medical Record Number: [id number] Patient Account Number: [id number] Date of Birth/Sex: 1932-02-24 (84 y.o. M) Treating RN: Montey Hora Primary Care Provider: Webb Silversmith Other Clinician: Referring Provider: Webb Silversmith Treating Provider/Extender: Melburn Hake, Myda Detwiler Weeks in Treatment: 1 Subjective Chief Complaint Information obtained from Patient Left lateral LE ulcer History of Present Illness (HPI) The following HPI elements were documented for the patient's wound: Associated Signs and Symptoms: Patient has a history of lymphedema, type II diabetes mellitus, long-term use of Eliquis, congestive heart failure, and COPD. 12/06/17 on evaluation today patient is here for evaluation of an ulcer on the left lateral lower extremity which has been present for roughly 2-3 months. Currently he has been using silver alginate and it was considered putting the patient in an [employer] wrap.  However he did have arterial studies which actually showed to be fairly normal on the right however on the left he did have an ABI of 0.75. With that being said this ABI was consistent with at least mild-to-moderate arterial disease. The patient actually has some question on the test as to whether or not this result was actually even accurate his arterial disease could be more significant. For that reason I think he may need to see a vascular specialist to see about possibly undergoing an angiogram to see if arterial flow could be improved. With that being said it does appear that the patient rather desperately needs compression therapy he has significant edema of the bilateral lower extremities noted during evaluation today. No fevers, chills, nausea, or vomiting noted at this time. 12/16/17 on evaluation today patient actually appears to be doing better in regard to his lower extremity ulcers of the left lower extremity. Both wounds are significantly smaller and improved compared to the initial evaluation last week. Overall I'm very pleased with the progress at this time. The patient likewise is pleased with how things appear. He states that he has thought about it and really does not want to have an angiogram that would prefer not to go see the vascular specialist which we had recommended last week. He states when he had this previous on top of the fact that he had issues with getting the groin area to stop bleeding he states that the die also seemed to cause issues in fact he states through him into having another heart attack. Obviously he has good reasons to want to try to avoid this which I completely understand. Patient History Information obtained from Patient. Family History Diabetes - Maternal Grandparents, Heart Disease - Paternal Grandparents, Hypertension - Paternal Grandparents, Lung Disease - Mother, No family history of Cancer, Hereditary Spherocytosis, Kidney Disease, Seizures,  Stroke, Thyroid Problems, Tuberculosis. Social History Former smoker - quit in 1993, Marital Status - Married, Alcohol Use - Never, Drug Use - No History, Caffeine Use - Daily. Medical And Surgical History Notes Neurologic CVA in 1999 Review of Systems (ROS) Constitutional Symptoms (General Health) Denies complaints or symptoms of Fever, Chills. KEIYON, PLACK ([id number]) Respiratory The patient has no complaints or symptoms. Cardiovascular Complains or has symptoms of LE edema. Psychiatric The patient has no complaints or symptoms. Objective Constitutional Well-nourished and well-hydrated in no acute distress. Vitals Time Taken: 12:37 PM, Height:  66 in, Weight: 182 lbs, BMI: 29.4, Temperature: 97.7 F, Pulse: 57 bpm, Respiratory Rate: 20 breaths/min, Blood Pressure: 105/62 mmHg. Respiratory normal breathing without difficulty. clear to auscultation bilaterally. Cardiovascular regular rate and rhythm with normal S1, S2. 2+ pitting edema of the bilateral lower extremities. Psychiatric this patient is able to make decisions and demonstrates good insight into disease process. Alert and Oriented x 3. pleasant and cooperative. General Notes: Patient's wounds currently did not require any sharp debridement I did mechanically debride away slough utilizing saline and gauze which he tolerated without complication. He still does have swelling noted of the left lower extremity though fortunately it's not quite as bad I can feel a faint pulse despite the fact he still does have swelling he's been trying to keep his legs elevated which is excellent. That's exactly what he needs to do at this point in my pinion. Integumentary (Hair, Skin) Wound #1 status is Open. Original cause of wound was Gradually Appeared. The wound is located on the Left,Proximal,Lateral Lower Leg. The wound measures 2.7cm length x 2.1cm width x 0.1cm depth; 4.453cm^2 area and 0.445cm^3 volume. There is Fat Layer  (Subcutaneous Tissue) Exposed exposed. There is no tunneling or undermining noted. There is a medium amount of serous drainage noted. The wound margin is flat and intact. There is medium (34-66%) pink granulation within the wound bed. There is a medium (34-66%) amount of necrotic tissue within the wound bed including Adherent Slough. The periwound skin appearance exhibited: Hemosiderin Staining. The periwound skin appearance did not exhibit: Callus, Crepitus, Excoriation, Induration, Rash, Scarring, Dry/Scaly, Maceration, Atrophie Blanche, Cyanosis, Ecchymosis, Mottled, Pallor, Rubor, Erythema. Periwound temperature was noted as No Abnormality. The periwound has tenderness on palpation. General Notes: some circumferential redness noted around Lt. lower extremity. Wound #2 status is Open. Original cause of wound was Gradually Appeared. The wound is located on the Left,Distal,Lateral Lower Leg. The wound measures 0.8cm length x 0.5cm width x 0.1cm depth; 0.314cm^2 area and 0.031cm^3 volume. There is no tunneling or undermining noted. There is a large amount of serous drainage noted. The wound margin is flat and intact. There is small (1-33%) red granulation within the wound bed. There is a small (1-33%) amount of necrotic tissue within the wound bed including Adherent Slough. The periwound skin appearance had no abnormalities noted for texture. The periwound skin appearance exhibited: Erythema. The periwound skin appearance did not exhibit: Dry/Scaly, Maceration. The surrounding wound skin color is noted with erythema. Periwound temperature was noted as No Abnormality. The periwound Muzio, [place] ([id number]) has tenderness on palpation. Assessment Active Problems ICD-10 Lymphedema, not elsewhere classified Non-pressure chronic ulcer of other part of left lower leg with fat layer exposed Type 2 diabetes mellitus with other skin ulcer Long term (current) use of anticoagulants Chronic  combined systolic (congestive) and diastolic (congestive) heart failure Chronic obstructive pulmonary disease, unspecified Plan Wound Cleansing: Wound #1 Left,Proximal,Lateral Lower Leg: Clean wound with Normal Saline. Cleanse wound with mild soap and water May Shower, gently pat wound dry prior to applying new dressing. Wound #2 Left,Distal,Lateral Lower Leg: Clean wound with Normal Saline. Cleanse wound with mild soap and water May Shower, gently pat wound dry prior to applying new dressing. Anesthetic (add to Medication List): Wound #1 Left,Proximal,Lateral Lower Leg: Topical Lidocaine 4% cream applied to wound bed prior to debridement (In Clinic Only). Wound #2 Left,Distal,Lateral Lower Leg: Topical Lidocaine 4% cream applied to wound bed prior to debridement (In Clinic Only). Primary Wound Dressing: Wound #1 Left,Proximal,Lateral Lower  Leg: Silver Alginate Wound #2 Left,Distal,Lateral Lower Leg: Silver Alginate Secondary Dressing: Wound #1 Left,Proximal,Lateral Lower Leg: ABD pad Conform/Kerlix Wound #2 Left,Distal,Lateral Lower Leg: ABD pad Conform/Kerlix Dressing Change Frequency: Wound #1 Left,Proximal,Lateral Lower Leg: Change dressing every other day. Other: - as needed Wound #2 Left,Distal,Lateral Lower Leg: Change dressing every other day. Other: - as needed Follow-up Appointments: LELYND, POER ([id number]) Wound #1 Left,Proximal,Lateral Lower Leg: Return Appointment in 2 weeks. Wound #2 Left,Distal,Lateral Lower Leg: Return Appointment in 2 weeks. Edema Control: Wound #1 Left,Proximal,Lateral Lower Leg: Elevate legs to the level of the heart and pump ankles as often as possible Other: - tubigrip Wound #2 Left,Distal,Lateral Lower Leg: Elevate legs to the level of the heart and pump ankles as often as possible Other: - tubigrip Additional Orders / Instructions: Wound #1 Left,Proximal,Lateral Lower Leg: Increase protein intake. Wound #2  Left,Distal,Lateral Lower Leg: Increase protein intake. Home Health: Wound #1 Left,Proximal,Lateral Lower Leg: [place] Nurse may visit PRN to address patient s wound care needs. FACE TO FACE ENCOUNTER: MEDICARE and MEDICAID PATIENTS: I certify that this patient is under my care and that I had a face-to-face encounter that meets the physician face-to-face encounter requirements with this patient on this date. The encounter with the patient was in whole or in part for the following MEDICAL CONDITION: (primary reason for [place]) MEDICAL NECESSITY: I certify, that based on my findings, NURSING services are a medically necessary home health service. HOME BOUND STATUS: I certify that my clinical findings support that this patient is homebound (i.e., Due to illness or injury, pt requires aid of supportive devices such as crutches, cane, wheelchairs, walkers, the use of special transportation or the assistance of another person to leave their place of residence. There is a normal inability to leave the home and doing so requires considerable and taxing effort. Other absences are for medical reasons / religious services and are infrequent or of short duration when for other reasons). If current dressing causes regression in wound condition, may D/C ordered dressing product/s and apply Normal Saline Moist Dressing daily until next [place] / Other MD appointment. [place] of regression in wound condition at [phone]. Please direct any NON-WOUND related issues/requests for orders to patient's Primary Care Physician Wound #2 Left,Distal,Lateral Lower Leg: [place] Nurse may visit PRN to address patient s wound care needs. FACE TO FACE ENCOUNTER: MEDICARE and MEDICAID PATIENTS: I certify that this patient is under my care and that I had a face-to-face encounter that meets the physician face-to-face  encounter requirements with this patient on this date. The encounter with the patient was in whole or in part for the following MEDICAL CONDITION: (primary reason for [place]) MEDICAL NECESSITY: I certify, that based on my findings, NURSING services are a medically necessary home health service. HOME BOUND STATUS: I certify that my clinical findings support that this patient is homebound (i.e., Due to illness or injury, pt requires aid of supportive devices such as crutches, cane, wheelchairs, walkers, the use of special transportation or the assistance of another person to leave their place of residence. There is a normal inability to leave the home and doing so requires considerable and taxing effort. Other absences are for medical reasons / religious services and are infrequent or of short duration when for other reasons). If current dressing causes regression in wound condition, may D/C ordered dressing product/s and apply Normal Saline Moist Dressing daily  until next [place] / Other MD appointment. [place] of regression in wound condition at [phone]. Please direct any NON-WOUND related issues/requests for orders to patient's Primary Care Physician My suggestion is going to be that we continue with the above wound care orders for the next week. The patient is in agreement with the plan. We will subsequently see were things stand at follow-up in roughly 1 1/2 weeks. He is in agreement with that plan. I hope that he will continue to show good improvement as he has up to this point of the period of time between now and then if he has any concerns or problems he or his daughter will get in touch with [place] for additional recommendations and suggestions at that point. MILON, DETHLOFF T. ([id number]) Please see above for specific wound care orders. We will see patient for re-evaluation in 1 week(s) here in the clinic. If anything worsens or changes patient  will contact our office for additional recommendations. Electronic Signature(s) Signed: 12/18/2017 9:18:35 AM By: Worthy Keeler PA-C Entered By: Worthy Keeler on 12/16/2017 13:39:53 Briguglio, Gavin Pound ([id number]) -------------------------------------------------------------------------------- ROS/PFSH Details Patient Name: [name] T. Date of Service: 12/16/2017 12:45 PM Medical Record Number: [id number] Patient Account Number: [id number] Date of Birth/Sex: 08-28-1931 (84 y.o. M) Treating RN: Montey Hora Primary Care Provider: Webb Silversmith Other Clinician: Referring Provider: Webb Silversmith Treating Provider/Extender: Melburn Hake, Jaydrian Corpening Weeks in Treatment: 1 Information Obtained From Patient Wound History Do you currently have one or more open woundso Yes How many open wounds do you currently haveo 1 Approximately how long have you had your woundso 2 months How have you been treating your wound(s) until nowo silver alginate Has your wound(s) ever healed and then re-openedo No Have you had any lab work done in the past montho Yes Who ordered the lab work doneo PCP Have you tested positive for an antibiotic resistant organism (MRSA, VRE)o No Have you tested positive for osteomyelitis (bone infection)o No Have you had any tests for circulation on your legso No Have you had other problems associated with your woundso Infection, Swelling Constitutional Symptoms (General Health) Complaints and Symptoms: Negative for: Fever; Chills Cardiovascular Complaints and Symptoms: Positive for: LE edema Medical History: Positive for: Arrhythmia - a fib; Congestive Heart Failure; Hypertension; Myocardial Infarction - 1997 Negative for: Angina; Coronary Artery Disease; Deep Vein Thrombosis; Hypotension; Peripheral Arterial Disease; Peripheral Venous Disease; Phlebitis; Vasculitis Eyes Medical History: Negative for: Cataracts; Glaucoma; Optic Neuritis Ear/Nose/Mouth/Throat Medical  History: Negative for: Chronic sinus problems/congestion; Middle ear problems Hematologic/Lymphatic Medical History: Negative for: Anemia; Hemophilia; Human Immunodeficiency Virus; Lymphedema; Sickle Cell Disease Respiratory Complaints and Symptoms: No Complaints or Symptoms FORD, PEDDIE T. ([id number]) Medical History: Positive for: Chronic Obstructive Pulmonary Disease (COPD); Sleep Apnea - CPAP Negative for: Aspiration; Asthma; Pneumothorax; Tuberculosis Gastrointestinal Medical History: Negative for: Cirrhosis ; Colitis; Crohnos; Hepatitis A; Hepatitis B; Hepatitis C Endocrine Medical History: Positive for: Type II Diabetes Negative for: Type I Diabetes Treated with: Insulin, Oral agents Blood sugar tested every day: Yes Tested : QD Genitourinary Medical History: Negative for: End Stage Renal Disease Immunological Medical History: Negative for: Lupus Erythematosus; Raynaudos; Scleroderma Integumentary (Skin) Medical History: Negative for: History of Burn; History of pressure wounds Musculoskeletal Medical History: Positive for: Osteoarthritis Negative for: Gout; Rheumatoid Arthritis; Osteomyelitis Neurologic Medical History: Positive for: Neuropathy Negative for: Dementia; Quadriplegia; Paraplegia; Seizure Disorder Past Medical History Notes: CVA in [place] History: Positive for: Received Radiation - 5 treatments in chest  Negative for: Received Chemotherapy Psychiatric Complaints and Symptoms: No Complaints or Symptoms Medical History: ANDREY, MCCASKILL ([id number]) Negative for: Anorexia/bulimia; Confinement Anxiety Immunizations Pneumococcal Vaccine: Received Pneumococcal Vaccination: Yes Implantable Devices Family and Social History Cancer: No; Diabetes: Yes - Maternal Grandparents; Heart Disease: Yes - Paternal Grandparents; Hereditary Spherocytosis: No; Hypertension: Yes - Paternal Grandparents; Kidney Disease: No; Lung Disease: Yes -  Mother; Seizures: No; Stroke: No; Thyroid Problems: No; Tuberculosis: No; Former smoker - quit in 1993; Marital Status - Married; Alcohol Use: Never; Drug Use: No History; Caffeine Use: Daily; Financial Concerns: No; Food, Clothing or Shelter Needs: No; Support System Lacking: No; Transportation Concerns: No; Advanced Directives: No; Patient does not want information on Advanced Directives Physician Affirmation I have reviewed and agree with the above information. Electronic Signature(s) Signed: 12/16/2017 5:28:07 PM By: Montey Hora Signed: 12/18/2017 9:18:35 AM By: Worthy Keeler PA-C Entered By: Worthy Keeler on 12/16/2017 13:38:24 [name] ([id number]) -------------------------------------------------------------------------------- SuperBill Details Patient Name: [name] T. Date of Service: 12/16/2017 Medical Record Number: [id number] Patient Account Number: [id number] Date of Birth/Sex: 1932-03-19 (84 y.o. M) Treating RN: Montey Hora Primary Care Provider: Webb Silversmith Other Clinician: Referring Provider: Webb Silversmith Treating Provider/Extender: Melburn Hake, Belia Febo Weeks in Treatment: 1 Diagnosis Coding ICD-10 Codes Code Description I89.0 Lymphedema, not elsewhere classified L97.822 Non-pressure chronic ulcer of other part of left lower leg with fat layer exposed E11.622 Type 2 diabetes mellitus with other skin ulcer Z79.01 Long term (current) use of anticoagulants I50.42 Chronic combined systolic (congestive) and diastolic (congestive) heart failure J44.9 Chronic obstructive pulmonary disease, unspecified Facility Procedures CPT4 Code: 08657846 Description: 99213 - WOUND CARE VISIT-LEV 3 EST PT Modifier: Quantity: 1 Physician Procedures CPT4 Code Description: [id number] - WC PHYS LEVEL 3 - EST PT ICD-10 Diagnosis Description I89.0 Lymphedema, not elsewhere classified E11.622 Type 2 diabetes mellitus with other skin ulcer L97.822 Non-pressure chronic ulcer  of other part of left lower  leg wit Z79.01 Long term (current) use of anticoagulants Modifier: h fat layer expos Quantity: 1 ed Electronic Signature(s) Signed: 12/18/2017 9:18:35 AM By: Worthy Keeler PA-C Entered By: Worthy Keeler on 12/16/2017 13:40:10

## 2017-12-19 NOTE — Patient Instructions (Signed)

## 2017-12-19 NOTE — Progress Notes (Signed)
Subjective:    Patient ID: [name]., male    DOB: Aug 09, 1931, 84 y.o.   MRN: [id number]  HPI  Pt presents to the clinic today with c/o a tender spot on his left arm. He noticed this 1-2 days ago. He is not sure what happened to the area. He does not recall any trauma. He reports it is red and tender to touch. He is having difficulty moving his arm. He denies fevers. He has not tried anything OTC for this.  Review of Systems      Past Medical History:  Diagnosis Date  . Arthritis   . CAD    a. MI 01/29/1996 tx'd w/ TPA @ [place]; b. Myoview 06/2005: EF 50%, scar @ apex, mild peri-infarct ischemia  . Cancer ([place])    skin  . Chronic atrial fibrillation ([place])    a. since 2006; b. on warfarin  . Chronic diastolic CHF (congestive heart failure) ([place])    a. echo 04/2006: EF lower limits of nl, mod LVH, mild aortic root dilatation, & mild MR, biatrial enlargement; b. echo 04/2013: EF 60%, mod dilated LA, mild MR & TR, mod pulm HTN w/ RV systolic pressure 53, c. echo 04/21/14: EF 55-60%, unable to exclude WMA, severely dilated LA 6.6 cm, nl RVSP, mildly dilated aortic root  . COPD (chronic obstructive pulmonary disease) ([facility])    oxygen prn at home  . CVA [id number]   x2  . DM   . Falls   . GERD (gastroesophageal reflux disease)   . History of kidney stones   . HYPERLIPIDEMIA   . HYPERTENSION   . Kidney stone    a. s/p left ureteral stenting 04/24/14  . Left arm weakness    limited movement. S/P fall injury  . Neuropathy of both feet   . Poor balance   . Wears dentures    full upper and lower    Current Outpatient Medications  Medication Sig Dispense Refill  . acetaminophen (TYLENOL) 650 MG CR tablet Take 650 mg by mouth every 8 (eight) hours as needed.      Marland Kitchen albuterol (2.5 MG/3ML) 0.083% NEBU 3 mL, albuterol (5 MG/ML) 0.5% NEBU 0.5 mL Inhale 1 mg into the lungs.    Marland Kitchen albuterol (PROAIR HFA) 108 (90 Base) MCG/ACT inhaler USE 1 TO 2 INHALATIONS EVERY 4 HOURS AS NEEDED 25.5 g 3   . ALPRAZolam (XANAX) 0.25 MG tablet Take 1 tablet (0.25 mg total) by mouth at bedtime as needed for anxiety. 90 tablet 0  . amoxicillin-clavulanate (AUGMENTIN) 875-125 MG tablet Take 1 tablet by mouth 2 (two) times daily. 20 tablet 0  . atorvastatin (LIPITOR) 40 MG tablet Take 1 tablet (40 mg total) by mouth daily. 90 tablet 0  . citalopram (CELEXA) 10 MG tablet TAKE 1 TABLET DAILY 90 tablet 0  . clotrimazole (LOTRIMIN) 1 % cream Apply 1 application topically 2 (two) times daily. 30 g 0  . Diclofenac Sodium 1.5 % SOLN Place 2 mLs onto the skin 4 (four) times daily. 150 mL 5  . dorzolamide (TRUSOPT) 2 % ophthalmic solution Place 1 drop into both eyes 3 (three) times daily.     Marland Kitchen ELIQUIS 5 MG TABS tablet TAKE 1 TABLET TWICE A DAY 180 tablet 3  . ezetimibe (ZETIA) 10 MG tablet Take 1 tablet (10 mg total) by mouth daily. 90 tablet 0  . finasteride (PROSCAR) 5 MG tablet TAKE 1 TABLET DAILY 90 tablet 4  . gabapentin (NEURONTIN) 100 MG  capsule TAKE 1 CAPSULE THREE TIMES A DAY 270 capsule 1  . glipiZIDE (GLUCOTROL) 5 MG tablet TAKE 1 TABLET TWICE A DAY BEFORE MEALS 180 tablet 0  . HUMALOG KWIKPEN 100 UNIT/ML KiwkPen INJECT 18 UNITS WITH BREAKFAST, 20 UNITS WITH LUNCH, AND 16 UNITS WITH DINNER 60 mL 0  . Insulin Pen Needle (BD PEN NEEDLE NANO U/F) 32G X 4 MM MISC USE THREE TIMES A DAY FOR INSULIN ADMINISTRATION. 270 each 1  . Insulin Syringe-Needle U-100 30G X 1/2" 1 ML MISC 1 each by Does not apply route 3 (three) times daily. 200 each 5  . JANUVIA 100 MG tablet TAKE 1 TABLET DAILY 90 tablet 1  . latanoprost (XALATAN) 0.005 % ophthalmic solution Place 1 drop into both eyes at bedtime.     Marland Kitchen LEVEMIR FLEXTOUCH 100 UNIT/ML Pen INJECT 42 UNITS UNDER THE SKIN DAILY AT 10 P.M. 45 mL 1  . levothyroxine (SYNTHROID, LEVOTHROID) 50 MCG tablet TAKE 1 TABLET DAILY BEFORE BREAKFAST 90 tablet 0  . metolazone (ZAROXOLYN) 5 MG tablet TAKE 1 TABLET DAILY AS NEEDED 90 tablet 2  . metoprolol succinate (TOPROL-XL) 50 MG  24 hr tablet TAKE 1 TABLET TWICE A DAY 180 tablet 0  . nitroGLYCERIN (NITROSTAT) 0.4 MG SL tablet Place 0.4 mg under the tongue every 5 (five) minutes as needed.      . nystatin-triamcinolone (MYCOLOG II) cream Apply 1 application topically 2 (two) times daily.     . potassium chloride SA (K-DUR,KLOR-CON) 20 MEQ tablet Take 3 tablets (60 mEq total) by mouth 2 (two) times daily. 540 tablet 0  . predniSONE (DELTASONE) 10 MG tablet TAKE 1 TABLET DAILY WITH BREAKFAST 90 tablet 1  . SYMBICORT 160-4.5 MCG/ACT inhaler USE 2 INHALATIONS TWICE A DAY 30.6 g 3  . tamsulosin (FLOMAX) 0.4 MG CAPS capsule TAKE 1 CAPSULE DAILY 90 capsule 1  . tiotropium (SPIRIVA HANDIHALER) 18 MCG inhalation capsule Place 1 capsule (18 mcg total) daily into inhaler and inhale. 90 capsule 3  . torsemide (DEMADEX) 20 MG tablet TAKE 2 TABLETS TWICE A DAY 360 tablet 4   No current facility-administered medications for this visit.     Allergies  Allergen Reactions  . Contrast Media [Iodinated Diagnostic Agents] Shortness Of Breath  . Morphine And Related Other (See Comments)    Hallucinations   . Niacin And Related Dermatitis  . Other     Other reaction(s): SHORTNESS OF BREATH  . Amlodipine Rash    Family History  Problem Relation Age of Onset  . Heart disease Mother   . Heart disease Maternal Grandmother   . Diabetes Maternal Grandmother   . Cancer Neg Hx   . Stroke Neg Hx     Social History   Socioeconomic History  . Marital status: Married    Spouse name: Not on file  . Number of children: Not on file  . Years of education: Not on file  . Highest education level: Not on file  Occupational History  . Not on file  Social Needs  . Financial resource strain: Not on file  . Food insecurity:    Worry: Not on file    Inability: Not on file  . Transportation needs:    Medical: Not on file    Non-medical: Not on file  Tobacco Use  . Smoking status: Former Smoker    Packs/day: 2.00    Years: 40.00     Pack years: 80.00    Types: Cigarettes    Last attempt  to quit: 05/24/1990    Years since quitting: 27.5  . Smokeless tobacco: Former Systems developer    Quit date: 05/24/1990  Substance and Sexual Activity  . Alcohol use: No  . Drug use: No  . Sexual activity: Not Currently  Lifestyle  . Physical activity:    Days per week: Not on file    Minutes per session: Not on file  . Stress: Not on file  Relationships  . Social connections:    Talks on phone: Not on file    Gets together: Not on file    Attends religious service: Not on file    Active member of club or organization: Not on file    Attends meetings of clubs or organizations: Not on file    Relationship status: Not on file  . Intimate partner violence:    Fear of current or ex partner: Not on file    Emotionally abused: Not on file    Physically abused: Not on file    Forced sexual activity: Not on file  Other Topics Concern  . Not on file  Social History Narrative  . Not on file     Constitutional: Denies fever, malaise, fatigue, headache or abrupt weight changes.  Musculoskeletal: Pt reports left arm pain. Denies decrease in range of motion, difficulty with gait, muscle pain.  Skin: Pt reports redness of left arm. Denies rashes, lesions or ulcercations.    No other specific complaints in a complete review of systems (except as listed in HPI above).  Objective:   Physical Exam  BP 106/74   Pulse 61   Temp 97.8 F (36.6 C) (Oral)   Wt 184 lb (83.5 kg)   SpO2 98%   BMI 29.70 kg/m  Wt Readings from Last 3 Encounters:  12/19/17 184 lb (83.5 kg)  11/30/17 181 lb (82.1 kg)  11/22/17 184 lb 4 oz (83.6 kg)    General: Appears his stated age, chronically ill appearing, in NAD. Skin: 3 cm x 3 cm area of cellulitis noted of proximal medial forearm. Musculoskeletal: Normal flexion, extension and rotation of the left elbow. No joint swelling noted.    BMET    Component Value Date/Time   NA 138 09/28/2017 1039   NA 143  02/21/2015 1108   NA 137 08/05/2014 1839   K 2.7 (LL) 09/28/2017 1039   K 3.6 08/05/2014 1839   CL 89 (L) 09/28/2017 1039   CL 106 08/05/2014 1839   CO2 39 (H) 09/28/2017 1039   CO2 23 08/05/2014 1839   GLUCOSE 105 (H) 09/28/2017 1039   GLUCOSE 235 (H) 08/05/2014 1839   BUN 66 (H) 09/28/2017 1039   BUN 26 02/21/2015 1108   BUN 19 08/05/2014 1839   CREATININE 2.10 (H) 09/28/2017 1039   CREATININE 1.20 08/05/2014 1839   CALCIUM 9.6 09/28/2017 1039   CALCIUM 8.6 (L) 08/05/2014 1839   GFRNONAA 46 (L) 10/11/2016 0458   GFRNONAA 57 (L) 08/05/2014 1839   GFRAA 54 (L) 10/11/2016 0458   GFRAA >60 08/05/2014 1839    Lipid Panel     Component Value Date/Time   CHOL 104 11/22/2016 1019   TRIG 154.0 (H) 11/22/2016 1019   HDL 43.80 11/22/2016 1019   CHOLHDL 2 11/22/2016 1019   VLDL 30.8 11/22/2016 1019   LDLCALC 29 11/22/2016 1019    CBC    Component Value Date/Time   WBC 16.5 (H) 04/11/2017 1023   RBC 5.17 04/11/2017 1023   HGB 15.1 04/11/2017 1023  HGB 13.3 08/05/2014 1839   HCT 44.2 04/11/2017 1023   HCT 40.1 08/05/2014 1839   PLT 198 04/11/2017 1023   PLT 246 08/05/2014 1839   MCV 85.5 04/11/2017 1023   MCV 83 08/05/2014 1839   MCH 29.2 04/11/2017 1023   MCHC 34.1 04/11/2017 1023   RDW 16.1 (H) 04/11/2017 1023   RDW 16.3 (H) 08/05/2014 1839   LYMPHSABS 3.3 05/20/2016 1041   LYMPHSABS 2.3 08/05/2014 1839   MONOABS 1.7 (H) 05/20/2016 1041   MONOABS 2.0 (H) 08/05/2014 1839   EOSABS 0.2 05/20/2016 1041   EOSABS 0.1 08/05/2014 1839   BASOSABS 0.1 05/20/2016 1041   BASOSABS 0.1 08/05/2014 1839    Hgb A1C Lab Results  Component Value Date   HGBA1C 6.7 (H) 02/15/2017            Assessment & Plan:   Cellulitis of Left Forearm:  Encouraged elevation eRx for Keflex 250 mg TID x 10 days Warm compresses may be helpful  Return precautions discussed Webb Silversmith, NP

## 2017-12-20 ENCOUNTER — Ambulatory Visit: Payer: Medicare Other | Admitting: Physician Assistant

## 2017-12-21 ENCOUNTER — Encounter: Payer: Self-pay | Admitting: Internal Medicine

## 2017-12-21 ENCOUNTER — Ambulatory Visit (INDEPENDENT_AMBULATORY_CARE_PROVIDER_SITE_OTHER): Payer: Medicare Other | Admitting: Internal Medicine

## 2017-12-21 ENCOUNTER — Other Ambulatory Visit: Payer: Self-pay

## 2017-12-21 ENCOUNTER — Encounter: Payer: Self-pay | Admitting: Emergency Medicine

## 2017-12-21 ENCOUNTER — Emergency Department: Payer: Medicare Other

## 2017-12-21 ENCOUNTER — Inpatient Hospital Stay
Admission: EM | Admit: 2017-12-21 | Discharge: 2017-12-26 | DRG: 603 | Disposition: A | Discharge status: Home / Self Care | Payer: Medicare Other | Attending: Family Medicine | Admitting: Family Medicine

## 2017-12-21 VITALS — BP 106/62 | HR 82 | Temp 97.9°F | Wt 184.0 lb

## 2017-12-21 DIAGNOSIS — I2510 Atherosclerotic heart disease of native coronary artery without angina pectoris: Secondary | ICD-10-CM | POA: Diagnosis present

## 2017-12-21 DIAGNOSIS — E1142 Type 2 diabetes mellitus with diabetic polyneuropathy: Secondary | ICD-10-CM | POA: Diagnosis present

## 2017-12-21 DIAGNOSIS — Z87442 Personal history of urinary calculi: Secondary | ICD-10-CM

## 2017-12-21 DIAGNOSIS — K219 Gastro-esophageal reflux disease without esophagitis: Secondary | ICD-10-CM | POA: Diagnosis present

## 2017-12-21 DIAGNOSIS — Z9842 Cataract extraction status, left eye: Secondary | ICD-10-CM

## 2017-12-21 DIAGNOSIS — Z972 Presence of dental prosthetic device (complete) (partial): Secondary | ICD-10-CM

## 2017-12-21 DIAGNOSIS — C3432 Malignant neoplasm of lower lobe, left bronchus or lung: Secondary | ICD-10-CM | POA: Diagnosis present

## 2017-12-21 DIAGNOSIS — Z8249 Family history of ischemic heart disease and other diseases of the circulatory system: Secondary | ICD-10-CM

## 2017-12-21 DIAGNOSIS — I252 Old myocardial infarction: Secondary | ICD-10-CM

## 2017-12-21 DIAGNOSIS — Z955 Presence of coronary angioplasty implant and graft: Secondary | ICD-10-CM

## 2017-12-21 DIAGNOSIS — Z85828 Personal history of other malignant neoplasm of skin: Secondary | ICD-10-CM

## 2017-12-21 DIAGNOSIS — R52 Pain, unspecified: Secondary | ICD-10-CM

## 2017-12-21 DIAGNOSIS — Z923 Personal history of irradiation: Secondary | ICD-10-CM

## 2017-12-21 DIAGNOSIS — Z885 Allergy status to narcotic agent status: Secondary | ICD-10-CM

## 2017-12-21 DIAGNOSIS — R296 Repeated falls: Secondary | ICD-10-CM | POA: Diagnosis present

## 2017-12-21 DIAGNOSIS — E1165 Type 2 diabetes mellitus with hyperglycemia: Secondary | ICD-10-CM | POA: Diagnosis present

## 2017-12-21 DIAGNOSIS — Z79890 Hormone replacement therapy (postmenopausal): Secondary | ICD-10-CM

## 2017-12-21 DIAGNOSIS — E785 Hyperlipidemia, unspecified: Secondary | ICD-10-CM | POA: Diagnosis present

## 2017-12-21 DIAGNOSIS — I5032 Chronic diastolic (congestive) heart failure: Secondary | ICD-10-CM | POA: Diagnosis present

## 2017-12-21 DIAGNOSIS — I69393 Ataxia following cerebral infarction: Secondary | ICD-10-CM

## 2017-12-21 DIAGNOSIS — E876 Hypokalemia: Secondary | ICD-10-CM | POA: Diagnosis present

## 2017-12-21 DIAGNOSIS — Z7951 Long term (current) use of inhaled steroids: Secondary | ICD-10-CM

## 2017-12-21 DIAGNOSIS — I130 Hypertensive heart and chronic kidney disease with heart failure and stage 1 through stage 4 chronic kidney disease, or unspecified chronic kidney disease: Secondary | ICD-10-CM | POA: Diagnosis present

## 2017-12-21 DIAGNOSIS — L03114 Cellulitis of left upper limb: Secondary | ICD-10-CM | POA: Diagnosis not present

## 2017-12-21 DIAGNOSIS — N183 Chronic kidney disease, stage 3 (moderate): Secondary | ICD-10-CM | POA: Diagnosis present

## 2017-12-21 DIAGNOSIS — I482 Chronic atrial fibrillation, unspecified: Secondary | ICD-10-CM

## 2017-12-21 DIAGNOSIS — Z833 Family history of diabetes mellitus: Secondary | ICD-10-CM

## 2017-12-21 DIAGNOSIS — Z961 Presence of intraocular lens: Secondary | ICD-10-CM | POA: Diagnosis present

## 2017-12-21 DIAGNOSIS — Z91041 Radiographic dye allergy status: Secondary | ICD-10-CM

## 2017-12-21 DIAGNOSIS — Z794 Long term (current) use of insulin: Secondary | ICD-10-CM

## 2017-12-21 DIAGNOSIS — Z888 Allergy status to other drugs, medicaments and biological substances status: Secondary | ICD-10-CM

## 2017-12-21 DIAGNOSIS — E890 Postprocedural hypothyroidism: Secondary | ICD-10-CM | POA: Diagnosis present

## 2017-12-21 DIAGNOSIS — Z87891 Personal history of nicotine dependence: Secondary | ICD-10-CM

## 2017-12-21 DIAGNOSIS — F419 Anxiety disorder, unspecified: Secondary | ICD-10-CM | POA: Diagnosis present

## 2017-12-21 DIAGNOSIS — E11649 Type 2 diabetes mellitus with hypoglycemia without coma: Secondary | ICD-10-CM | POA: Diagnosis not present

## 2017-12-21 DIAGNOSIS — Z9841 Cataract extraction status, right eye: Secondary | ICD-10-CM

## 2017-12-21 DIAGNOSIS — Z7901 Long term (current) use of anticoagulants: Secondary | ICD-10-CM

## 2017-12-21 DIAGNOSIS — Z79899 Other long term (current) drug therapy: Secondary | ICD-10-CM

## 2017-12-21 DIAGNOSIS — N400 Enlarged prostate without lower urinary tract symptoms: Secondary | ICD-10-CM | POA: Diagnosis present

## 2017-12-21 DIAGNOSIS — I69354 Hemiplegia and hemiparesis following cerebral infarction affecting left non-dominant side: Secondary | ICD-10-CM

## 2017-12-21 DIAGNOSIS — Z9981 Dependence on supplemental oxygen: Secondary | ICD-10-CM

## 2017-12-21 DIAGNOSIS — Z7952 Long term (current) use of systemic steroids: Secondary | ICD-10-CM

## 2017-12-21 DIAGNOSIS — J449 Chronic obstructive pulmonary disease, unspecified: Secondary | ICD-10-CM | POA: Diagnosis present

## 2017-12-21 DIAGNOSIS — E119 Type 2 diabetes mellitus without complications: Secondary | ICD-10-CM

## 2017-12-21 LAB — CBC WITH DIFFERENTIAL/PLATELET
BASOS PCT: 0 %
Basophils Absolute: 0.1 10*3/uL (ref 0–0.1)
EOS ABS: 0 10*3/uL (ref 0–0.7)
EOS PCT: 0 %
HCT: 39.6 % — ABNORMAL LOW (ref 40.0–52.0)
HEMOGLOBIN: 13.9 g/dL (ref 13.0–18.0)
Lymphocytes Relative: 9 %
Lymphs Abs: 1.6 10*3/uL (ref 1.0–3.6)
MCH: 30.4 pg (ref 26.0–34.0)
MCHC: 35.1 g/dL (ref 32.0–36.0)
MCV: 86.8 fL (ref 80.0–100.0)
Monocytes Absolute: 1.6 10*3/uL — ABNORMAL HIGH (ref 0.2–1.0)
Monocytes Relative: 8 %
NEUTROS PCT: 83 %
Neutro Abs: 15.4 10*3/uL — ABNORMAL HIGH (ref 1.4–6.5)
PLATELETS: 218 10*3/uL (ref 150–440)
RBC: 4.56 MIL/uL (ref 4.40–5.90)
RDW: 17.3 % — ABNORMAL HIGH (ref 11.5–14.5)
WBC: 18.7 10*3/uL — AB (ref 3.8–10.6)

## 2017-12-21 LAB — COMPREHENSIVE METABOLIC PANEL
ALT: 13 U/L (ref 0–44)
AST: 13 U/L — ABNORMAL LOW (ref 15–41)
Albumin: 3.2 g/dL — ABNORMAL LOW (ref 3.5–5.0)
Alkaline Phosphatase: 58 U/L (ref 38–126)
Anion gap: 6 (ref 5–15)
BILIRUBIN TOTAL: 0.6 mg/dL (ref 0.3–1.2)
BUN: 35 mg/dL — ABNORMAL HIGH (ref 8–23)
CALCIUM: 8.9 mg/dL (ref 8.9–10.3)
CO2: 25 mmol/L (ref 22–32)
CREATININE: 1.58 mg/dL — AB (ref 0.61–1.24)
Chloride: 106 mmol/L (ref 98–111)
GFR, EST AFRICAN AMERICAN: 45 mL/min — AB (ref >60)
GFR, EST NON AFRICAN AMERICAN: 38 mL/min — AB (ref >60)
Glucose, Bld: 258 mg/dL — ABNORMAL HIGH (ref 70–99)
Potassium: 4.5 mmol/L (ref 3.5–5.1)
Sodium: 137 mmol/L (ref 135–145)
TOTAL PROTEIN: 6.7 g/dL (ref 6.5–8.1)

## 2017-12-21 LAB — LACTIC ACID, PLASMA: LACTIC ACID, VENOUS: 1.8 mmol/L (ref 0.5–1.9)

## 2017-12-21 MED ORDER — INSULIN ASPART PROT & ASPART (70-30 MIX) 100 UNIT/ML ~~LOC~~ SUSP
16.0000 [IU] | SUBCUTANEOUS | Status: AC
  Administered 2017-12-21: 16 [IU] via SUBCUTANEOUS
  Filled 2017-12-21: qty 10

## 2017-12-21 MED ORDER — VANCOMYCIN HCL IN DEXTROSE 1-5 GM/200ML-% IV SOLN
1000.0000 mg | Freq: Once | INTRAVENOUS | Status: AC
  Administered 2017-12-21: 1000 mg via INTRAVENOUS
  Filled 2017-12-21: qty 200

## 2017-12-21 NOTE — Patient Instructions (Signed)

## 2017-12-21 NOTE — Telephone Encounter (Signed)
Copied from [place] [id number]. Topic: General - Other >> Dec 21, 2017 11:17 AM Yvette Rack wrote: Reason for CRM: pt calling stating that [facility] had told him to call back if his elbow isn't any bette he states that it swollen from elbow down to his hand and that he cant close it he has gotten worst  he is on an antibiotic please give him a call back at [phone]

## 2017-12-21 NOTE — ED Triage Notes (Signed)
Pt to ED from PCP with LFT arm cellulitis, pt was seen on Monday and started on Keflex and seen today with no improvement. VSS, slight redness and + swelling noted.

## 2017-12-21 NOTE — ED Provider Notes (Signed)
[facility] Emergency Department Provider Note  ____________________________________________  Time seen: Approximately 10:34 PM  I have reviewed the triage vital signs and the nursing notes.   HISTORY  Chief Complaint Cellulitis    HPI [name]. is a 84 y.o. male with a history of CAD, atrial fibrillation, COPD, diabetes who sent to the ED for cellulitis of his left upper extremity by primary care.  Symptoms started 3 days ago, seen by primary care 2 days ago started on Keflex but unfortunately continuing to worsen with swelling pain and redness.  Denies fever but does have chills and generalized weakness.  Blood sugars been running higher than usual.  Worse with movement no alleviating factors, nonradiating.  No open wounds or recent trauma.  Denies chest pain or shortness of breath.  On Eliquis and compliant.      Past Medical History:  Diagnosis Date  . Arthritis   . CAD    a. MI 01/29/1996 tx'd w/ TPA @ [place]; b. Myoview 06/2005: EF 50%, scar @ apex, mild peri-infarct ischemia  . Cancer ([place])    skin  . Chronic atrial fibrillation ([place])    a. since 2006; b. on warfarin  . Chronic diastolic CHF (congestive heart failure) ([place])    a. echo 04/2006: EF lower limits of nl, mod LVH, mild aortic root dilatation, & mild MR, biatrial enlargement; b. echo 04/2013: EF 60%, mod dilated LA, mild MR & TR, mod pulm HTN w/ RV systolic pressure 53, c. echo 04/21/14: EF 55-60%, unable to exclude WMA, severely dilated LA 6.6 cm, nl RVSP, mildly dilated aortic root  . COPD (chronic obstructive pulmonary disease) (H[facility])    oxygen prn at home  . CVA 2841,3244   x2  . DM   . Falls   . GERD (gastroesophageal reflux disease)   . History of kidney stones   . HYPERLIPIDEMIA   . HYPERTENSION   . Kidney stone    a. s/p left ureteral stenting 04/24/14  . Left arm weakness    limited movement. S/P fall injury  . Neuropathy of both feet   . Poor balance   . Wears  dentures    full upper and lower     Patient Active Problem List   Diagnosis Date Noted  . Cellulitis of left lower extremity 11/22/2017  . Left leg swelling 11/18/2017  . Rash and nonspecific skin eruption 11/18/2017  . Acquired hypothyroidism 11/22/2016  . Osteoarthritis, knee 11/22/2016  . Diabetes type 2, controlled ([place]) 06/13/2015  . BPH (benign prostatic hyperplasia) 05/14/2014  . Anxiety 05/14/2014  . Chronic obstructive pulmonary disease ([place]) 05/14/2014  . Chronic diastolic CHF (congestive heart failure) ([place])   . Insomnia 08/07/2013  . Hyperlipidemia 02/26/2009  . MITRAL REGURGITATION 02/26/2009  . Essential hypertension 02/26/2009  . MI 02/26/2009  . Coronary artery disease of native artery of native heart with stable angina pectoris ([place]) 02/26/2009  . Chronic atrial fibrillation ([place]) 02/26/2009  . CVA (cerebral vascular accident) ([place]) 02/26/2009     Past Surgical History:  Procedure Laterality Date  . BLADDER SURGERY     stent placement   . CARDIAC CATHETERIZATION  1997   [facility]  . CAROTID STENT INSERTION  1997  . CATARACT EXTRACTION W/PHACO Left 10/11/2017   Procedure: CATARACT EXTRACTION PHACO AND INTRAOCULAR LENS PLACEMENT ([place]) COMPLICATED LEFT DIABETIC;  Surgeon: Leandrew Koyanagi, MD;  Location: [place];  Service: Ophthalmology;  Laterality: Left;  MALYUGIN Diabetic - insulin  . CATARACT EXTRACTION  W/PHACO Right 11/30/2017   Procedure: CATARACT EXTRACTION PHACO AND INTRAOCULAR LENS PLACEMENT ([place]) COMPLICATED  RIGHT DIABETIC;  Surgeon: Leandrew Koyanagi, MD;  Location: [place];  Service: Ophthalmology;  Laterality: Right;  Diabetic - insulin  . CIRCUMCISION  2016  . CORONARY ANGIOPLASTY  1997   s/p stent placement x 2   . CYSTOSCOPY W/ URETERAL STENT REMOVAL Left 10/09/2014   Procedure: CYSTOSCOPY WITH STENT REMOVAL;  Surgeon: Hollice Espy, MD;  Location: [facility];  Service: Urology;  Laterality: Left;  . CYSTOSCOPY WITH  STENT PLACEMENT Left 10/09/2014   Procedure: CYSTOSCOPY WITH STENT PLACEMENT;  Surgeon: Hollice Espy, MD;  Location: [facility];  Service: Urology;  Laterality: Left;  . KIDNEY SURGERY  05/2013   s/p stent placement   . stents ureters Bilateral   . TONSILLECTOMY AND ADENOIDECTOMY  1959  . URETEROSCOPY WITH HOLMIUM LASER LITHOTRIPSY Left 10/09/2014   Procedure: URETEROSCOPY WITH HOLMIUM LASER LITHOTRIPSY;  Surgeon: Hollice Espy, MD;  Location: [facility];  Service: Urology;  Laterality: Left;     Prior to Admission medications   Medication Sig Start Date End Date Taking? Authorizing Provider  acetaminophen (TYLENOL) 650 MG CR tablet Take 650 mg by mouth every 8 (eight) hours as needed.      [provider]  albuterol (2.5 MG/3ML) 0.083% NEBU 3 mL, albuterol (5 MG/ML) 0.5% NEBU 0.5 mL Inhale 1 mg into the lungs.    [provider]  albuterol (PROAIR HFA) 108 (90 Base) MCG/ACT inhaler USE 1 TO 2 INHALATIONS EVERY 4 HOURS AS NEEDED 11/09/17   Wilhelmina Mcardle, MD  ALPRAZolam Duanne Moron) 0.25 MG tablet Take 1 tablet (0.25 mg total) by mouth at bedtime as needed for anxiety. 11/09/17   Jearld Fenton, NP  atorvastatin (LIPITOR) 40 MG tablet Take 1 tablet (40 mg total) by mouth daily. 10/17/17   Jearld Fenton, NP  cephALEXin (KEFLEX) 250 MG capsule Take 1 capsule (250 mg total) by mouth 3 (three) times daily. 12/19/17   Jearld Fenton, NP  citalopram (CELEXA) 10 MG tablet TAKE 1 TABLET DAILY 12/19/17   Jearld Fenton, NP  clotrimazole (LOTRIMIN) 1 % cream Apply 1 application topically 2 (two) times daily. 11/18/17   Bedsole, Amy E, MD  Diclofenac Sodium 1.5 % SOLN Place 2 mLs onto the skin 4 (four) times daily. 07/15/16   Copland, Frederico Hamman, MD  dorzolamide (TRUSOPT) 2 % ophthalmic solution Place 1 drop into both eyes 3 (three) times daily.  03/31/16   [provider]  ELIQUIS 5 MG TABS tablet TAKE 1 TABLET TWICE A DAY 09/19/17   Minna Merritts, MD  ezetimibe (ZETIA) 10 MG tablet Take 1  tablet (10 mg total) by mouth daily. 10/17/17   Jearld Fenton, NP  finasteride (PROSCAR) 5 MG tablet TAKE 1 TABLET DAILY 06/29/17   Jearld Fenton, NP  gabapentin (NEURONTIN) 100 MG capsule TAKE 1 CAPSULE THREE TIMES A DAY 09/22/17   Jearld Fenton, NP  glipiZIDE (GLUCOTROL) 5 MG tablet TAKE 1 TABLET TWICE A DAY BEFORE MEALS 10/31/17   Baity, Coralie Keens, NP  HUMALOG KWIKPEN 100 UNIT/ML KiwkPen INJECT 18 UNITS WITH BREAKFAST, 20 UNITS WITH LUNCH, AND 16 UNITS WITH DINNER 10/21/17   Baity, Coralie Keens, NP  Insulin Pen Needle (BD PEN NEEDLE NANO U/F) 32G X 4 MM MISC USE THREE TIMES A DAY FOR INSULIN ADMINISTRATION. 08/16/17   Jearld Fenton, NP  Insulin Syringe-Needle U-100 30G X 1/2" 1 ML MISC 1 each  by Does not apply route 3 (three) times daily. 07/19/14   Jearld Fenton, NP  JANUVIA 100 MG tablet TAKE 1 TABLET DAILY 07/25/17   Jearld Fenton, NP  latanoprost (XALATAN) 0.005 % ophthalmic solution Place 1 drop into both eyes at bedtime.  04/18/16   [provider]  LEVEMIR FLEXTOUCH 100 UNIT/ML Pen INJECT 42 UNITS UNDER THE SKIN DAILY AT 10 P.M. 08/16/17   Jearld Fenton, NP  levothyroxine (SYNTHROID, LEVOTHROID) 50 MCG tablet TAKE 1 TABLET DAILY BEFORE BREAKFAST 09/29/17   Jearld Fenton, NP  metolazone (ZAROXOLYN) 5 MG tablet TAKE 1 TABLET DAILY AS NEEDED 06/14/17   Minna Merritts, MD  metoprolol succinate (TOPROL-XL) 50 MG 24 hr tablet TAKE 1 TABLET TWICE A DAY 10/10/17   Baity, Coralie Keens, NP  nitroGLYCERIN (NITROSTAT) 0.4 MG SL tablet Place 0.4 mg under the tongue every 5 (five) minutes as needed.      [provider]  nystatin-triamcinolone (MYCOLOG II) cream Apply 1 application topically 2 (two) times daily.  03/12/14   [provider]  potassium chloride SA (K-DUR,KLOR-CON) 20 MEQ tablet Take 3 tablets (60 mEq total) by mouth 2 (two) times daily. 10/24/17   Jearld Fenton, NP  predniSONE (DELTASONE) 10 MG tablet TAKE 1 TABLET DAILY WITH BREAKFAST 10/26/17   Jearld Fenton, NP   SYMBICORT 160-4.5 MCG/ACT inhaler USE 2 INHALATIONS TWICE A DAY 08/31/17   Wilhelmina Mcardle, MD  tamsulosin (FLOMAX) 0.4 MG CAPS capsule TAKE 1 CAPSULE DAILY 10/21/17   Jearld Fenton, NP  tiotropium (SPIRIVA HANDIHALER) 18 MCG inhalation capsule Place 1 capsule (18 mcg total) daily into inhaler and inhale. 02/14/17   Wilhelmina Mcardle, MD  torsemide (DEMADEX) 20 MG tablet TAKE 2 TABLETS TWICE A DAY 12/13/17   Minna Merritts, MD     Allergies Contrast media [iodinated diagnostic agents]; Morphine and related; Niacin and related; Other; and Amlodipine   Family History  Problem Relation Age of Onset  . Heart disease Mother   . Heart disease Maternal Grandmother   . Diabetes Maternal Grandmother   . Cancer Neg Hx   . Stroke Neg Hx     Social History Social History   Tobacco Use  . Smoking status: Former Smoker    Packs/day: 2.00    Years: 40.00    Pack years: 80.00    Types: Cigarettes    Last attempt to quit: 05/24/1990    Years since quitting: 27.5  . Smokeless tobacco: Former Systems developer    Quit date: 05/24/1990  Substance Use Topics  . Alcohol use: No  . Drug use: No    Review of Systems  Constitutional:   No fever positive chills chills.  ENT:   No sore throat. No rhinorrhea. Cardiovascular:   No chest pain or syncope. Respiratory:   No dyspnea or cough. Gastrointestinal:   Negative for abdominal pain, vomiting and diarrhea.  Musculoskeletal:   Left upper extremity pain and swelling as above All other systems reviewed and are negative except as documented above in ROS and HPI.  ____________________________________________   PHYSICAL EXAM:  VITAL SIGNS: ED Triage Vitals  Enc Vitals Group     BP 12/21/17 1714 134/72     Pulse Rate 12/21/17 1714 81     Resp 12/21/17 1714 16     Temp 12/21/17 1714 97.9 F (36.6 C)     Temp Source 12/21/17 1714 Oral     SpO2 12/21/17 1714 96 %  Weight 12/21/17 1715 184 lb (83.5 kg)     Height 12/21/17 1715 5\' 6"  (1.676 m)      Head Circumference --      Peak Flow --      Pain Score 12/21/17 1715 0     Pain Loc --      Pain Edu? --      Excl. in [place]? --     Vital signs reviewed, nursing assessments reviewed.   Constitutional:   Alert and oriented. Non-toxic appearance. Eyes:   Conjunctivae are normal. EOMI. PERRL. ENT      Head:   Normocephalic and atraumatic.      Nose:   No congestion/rhinnorhea.       Mouth/Throat:   MMM, no pharyngeal erythema. No peritonsillar mass.       Neck:   No meningismus. Full ROM. Hematological/Lymphatic/Immunilogical:   No cervical lymphadenopathy. Cardiovascular:   Irregularly irregular rhythm. Symmetric bilateral radial and DP pulses.  No murmurs. Cap refill less than 2 seconds. Respiratory:   Normal respiratory effort without tachypnea/retractions. Breath sounds are clear and equal bilaterally. No wheezes/rales/rhonchi. Gastrointestinal:   Soft and nontender. Non distended. There is no CVA tenderness.  No rebound, rigidity, or guarding. Musculoskeletal:   Normal range of motion in all extremities. No joint effusions.  Diffuse swelling and erythema of the left upper extremity from wrist to mid upper arm.  Tender.  Intact range of motion in the joints, no bony point tenderness.  No crepitus or fluctuance.  No wounds. Left lower extremity with a chronic wound on the lower leg, managed by wound center.  No inflammatory changes in the leg.  Improving over the past week according the patient.  Other extremity is unremarkable.. Neurologic:   Normal speech and language.  Motor grossly intact. No acute focal neurologic deficits are appreciated.  Skin:    Skin is warm, dry with left upper extremity inflammatory skin changes as noted above. .  No petechiae, purpura, or bullae.  ____________________________________________    LABS (pertinent positives/negatives) (all labs ordered are listed, but only abnormal results are displayed) Labs Reviewed  COMPREHENSIVE METABOLIC PANEL -  Abnormal; Notable for the following components:      Result Value   Glucose, Bld 258 (*)    BUN 35 (*)    Creatinine, Ser 1.58 (*)    Albumin 3.2 (*)    AST 13 (*)    GFR calc non Af Amer 38 (*)    GFR calc Af Amer 45 (*)    All other components within normal limits  CBC WITH DIFFERENTIAL/PLATELET - Abnormal; Notable for the following components:   WBC 18.7 (*)    HCT 39.6 (*)    RDW 17.3 (*)    Neutro Abs 15.4 (*)    Monocytes Absolute 1.6 (*)    All other components within normal limits  LACTIC ACID, PLASMA  URINALYSIS, COMPLETE (UACMP) WITH MICROSCOPIC   ____________________________________________   EKG    ____________________________________________    RADIOLOGY  US Venous Img Upper Uni Left  Result Date: 12/21/2017 CLINICAL DATA:  Left upper extremity swelling and pain for 4 days EXAM: LEFT UPPER EXTREMITY VENOUS DUPLEX ULTRASOUND TECHNIQUE: Doppler venous assessment of the left upper extremity deep venous system was performed, including characterization of spectral flow, compressibility, and phasicity. COMPARISON:  None. FINDINGS: Left jugular vein is compressible. Color Doppler imaging demonstrates patency. Doppler analysis demonstrates a phasic venous waveform. Color Doppler imaging of the left subclavian vein demonstrates patency. Doppler analysis  demonstrates a phasic venous waveform. There is complete compressibility of the left axillary, brachial, radial, and ulnar veins. Doppler analysis demonstrates phasicity. No evidence of superficial vein thrombosis. IMPRESSION: No evidence of left upper extremity DVT. Electronically Signed   By: Marybelle Killings M.D.   On: 12/21/2017 21:28    ____________________________________________   PROCEDURES Procedures  ____________________________________________    CLINICAL IMPRESSION / ASSESSMENT AND PLAN / ED COURSE  Pertinent labs & imaging results that were available during my care of the patient were reviewed by me and  considered in my medical decision making (see chart for details).    Presents with pain swelling of the left arm, consistent with cellulitis.  Additionally ultrasound is negative for DVT but does show cobblestoning appearance of the soft tissues consistent with cellulitis on my viewing of the images.  He has a leukocytosis of 18,000.  Coupled with his advanced age and diabetes, he is at high risk of having a prolonged/complicated course with this illness and warrants hospitalization.  Ordered IV vancomycin.  Not septic.  Normal vital signs.  Patient and family agree with the current plan.      ____________________________________________   FINAL CLINICAL IMPRESSION(S) / ED DIAGNOSES    Final diagnoses:  Cellulitis of left upper extremity  Type 2 diabetes mellitus without complication, with long-term current use of insulin ([place])  Chronic atrial fibrillation [facility])     ED Discharge Orders    None      Portions of this note were generated with dragon dictation software. Dictation errors may occur despite best attempts at proofreading.    Carrie Mew, MD 12/21/17 2238

## 2017-12-21 NOTE — ED Notes (Signed)
Pt's daughter to STAT desk; indicates pt's MZTA 682; st insulin is due now but does not have his own with him; informed daughter that the provider would need to evaluate him and that he would be going to next available exam room; daughter voices understanding

## 2017-12-21 NOTE — Progress Notes (Signed)
Subjective:    Patient ID: [name]., male    DOB: [birth year], 84 y.o.   MRN: [id number]  HPI  Pt presents to the clinic today with c/o worsening left arm pain, swelling and redness. He was seen 2 days ago for the same. He was started on Keflex and after 2 days, the pain, swelling and redness continues to get worse. He has started running a low grade fevers. He has not tried anything OTC.  Review of Systems      Past Medical History:  Diagnosis Date  . Arthritis   . CAD    a. MI 01/29/1996 tx'd w/ TPA @ [facility]; b. Myoview 06/2005: EF 50%, scar @ apex, mild peri-infarct ischemia  . Cancer ([place])    skin  . Chronic atrial fibrillation ([place])    a. since 2006; b. on warfarin  . Chronic diastolic CHF (congestive heart failure) ([place])    a. echo 04/2006: EF lower limits of nl, mod LVH, mild aortic root dilatation, & mild MR, biatrial enlargement; b. echo 04/2013: EF 60%, mod dilated LA, mild MR & TR, mod pulm HTN w/ RV systolic pressure 53, c. echo 04/21/14: EF 55-60%, unable to exclude WMA, severely dilated LA 6.6 cm, nl RVSP, mildly dilated aortic root  . COPD (chronic obstructive pulmonary disease) ([facility])    oxygen prn at home  . CVA [id number]   x2  . DM   . Falls   . GERD (gastroesophageal reflux disease)   . History of kidney stones   . HYPERLIPIDEMIA   . HYPERTENSION   . Kidney stone    a. s/p left ureteral stenting 04/24/14  . Left arm weakness    limited movement. S/P fall injury  . Neuropathy of both feet   . Poor balance   . Wears dentures    full upper and lower    Current Outpatient Medications  Medication Sig Dispense Refill  . acetaminophen (TYLENOL) 650 MG CR tablet Take 650 mg by mouth every 8 (eight) hours as needed.      Marland Kitchen albuterol (2.5 MG/3ML) 0.083% NEBU 3 mL, albuterol (5 MG/ML) 0.5% NEBU 0.5 mL Inhale 1 mg into the lungs.    Marland Kitchen albuterol (PROAIR HFA) 108 (90 Base) MCG/ACT inhaler USE 1 TO 2 INHALATIONS EVERY 4 HOURS AS NEEDED 25.5 g 3  . ALPRAZolam  (XANAX) 0.25 MG tablet Take 1 tablet (0.25 mg total) by mouth at bedtime as needed for anxiety. 90 tablet 0  . atorvastatin (LIPITOR) 40 MG tablet Take 1 tablet (40 mg total) by mouth daily. 90 tablet 0  . cephALEXin (KEFLEX) 250 MG capsule Take 1 capsule (250 mg total) by mouth 3 (three) times daily. 30 capsule 0  . citalopram (CELEXA) 10 MG tablet TAKE 1 TABLET DAILY 90 tablet 0  . clotrimazole (LOTRIMIN) 1 % cream Apply 1 application topically 2 (two) times daily. 30 g 0  . Diclofenac Sodium 1.5 % SOLN Place 2 mLs onto the skin 4 (four) times daily. 150 mL 5  . dorzolamide (TRUSOPT) 2 % ophthalmic solution Place 1 drop into both eyes 3 (three) times daily.     Marland Kitchen ELIQUIS 5 MG TABS tablet TAKE 1 TABLET TWICE A DAY 180 tablet 3  . ezetimibe (ZETIA) 10 MG tablet Take 1 tablet (10 mg total) by mouth daily. 90 tablet 0  . finasteride (PROSCAR) 5 MG tablet TAKE 1 TABLET DAILY 90 tablet 4  . gabapentin (NEURONTIN) 100 MG capsule TAKE 1 CAPSULE  THREE TIMES A DAY 270 capsule 1  . glipiZIDE (GLUCOTROL) 5 MG tablet TAKE 1 TABLET TWICE A DAY BEFORE MEALS 180 tablet 0  . HUMALOG KWIKPEN 100 UNIT/ML KiwkPen INJECT 18 UNITS WITH BREAKFAST, 20 UNITS WITH LUNCH, AND 16 UNITS WITH DINNER 60 mL 0  . Insulin Pen Needle (BD PEN NEEDLE NANO U/F) 32G X 4 MM MISC USE THREE TIMES A DAY FOR INSULIN ADMINISTRATION. 270 each 1  . Insulin Syringe-Needle U-100 30G X 1/2" 1 ML MISC 1 each by Does not apply route 3 (three) times daily. 200 each 5  . JANUVIA 100 MG tablet TAKE 1 TABLET DAILY 90 tablet 1  . latanoprost (XALATAN) 0.005 % ophthalmic solution Place 1 drop into both eyes at bedtime.     Marland Kitchen LEVEMIR FLEXTOUCH 100 UNIT/ML Pen INJECT 42 UNITS UNDER THE SKIN DAILY AT 10 P.M. 45 mL 1  . levothyroxine (SYNTHROID, LEVOTHROID) 50 MCG tablet TAKE 1 TABLET DAILY BEFORE BREAKFAST 90 tablet 0  . metolazone (ZAROXOLYN) 5 MG tablet TAKE 1 TABLET DAILY AS NEEDED 90 tablet 2  . metoprolol succinate (TOPROL-XL) 50 MG 24 hr tablet  TAKE 1 TABLET TWICE A DAY 180 tablet 0  . nitroGLYCERIN (NITROSTAT) 0.4 MG SL tablet Place 0.4 mg under the tongue every 5 (five) minutes as needed.      . nystatin-triamcinolone (MYCOLOG II) cream Apply 1 application topically 2 (two) times daily.     . potassium chloride SA (K-DUR,KLOR-CON) 20 MEQ tablet Take 3 tablets (60 mEq total) by mouth 2 (two) times daily. 540 tablet 0  . predniSONE (DELTASONE) 10 MG tablet TAKE 1 TABLET DAILY WITH BREAKFAST 90 tablet 1  . SYMBICORT 160-4.5 MCG/ACT inhaler USE 2 INHALATIONS TWICE A DAY 30.6 g 3  . tamsulosin (FLOMAX) 0.4 MG CAPS capsule TAKE 1 CAPSULE DAILY 90 capsule 1  . tiotropium (SPIRIVA HANDIHALER) 18 MCG inhalation capsule Place 1 capsule (18 mcg total) daily into inhaler and inhale. 90 capsule 3  . torsemide (DEMADEX) 20 MG tablet TAKE 2 TABLETS TWICE A DAY 360 tablet 4   No current facility-administered medications for this visit.     Allergies  Allergen Reactions  . Contrast Media [Iodinated Diagnostic Agents] Shortness Of Breath  . Morphine And Related Other (See Comments)    Hallucinations   . Niacin And Related Dermatitis  . Other     Other reaction(s): SHORTNESS OF BREATH  . Amlodipine Rash    Family History  Problem Relation Age of Onset  . Heart disease Mother   . Heart disease Maternal Grandmother   . Diabetes Maternal Grandmother   . Cancer Neg Hx   . Stroke Neg Hx     Social History   Socioeconomic History  . Marital status: Married    Spouse name: Not on file  . Number of children: Not on file  . Years of education: Not on file  . Highest education level: Not on file  Occupational History  . Not on file  Social Needs  . Financial resource strain: Not on file  . Food insecurity:    Worry: Not on file    Inability: Not on file  . Transportation needs:    Medical: Not on file    Non-medical: Not on file  Tobacco Use  . Smoking status: Former Smoker    Packs/day: 2.00    Years: 40.00    Pack years:  80.00    Types: Cigarettes    Last attempt to quit: 05/24/1990  Years since quitting: 27.5  . Smokeless tobacco: Former Systems developer    Quit date: 05/24/1990  Substance and Sexual Activity  . Alcohol use: No  . Drug use: No  . Sexual activity: Not Currently  Lifestyle  . Physical activity:    Days per week: Not on file    Minutes per session: Not on file  . Stress: Not on file  Relationships  . Social connections:    Talks on phone: Not on file    Gets together: Not on file    Attends religious service: Not on file    Active member of club or organization: Not on file    Attends meetings of clubs or organizations: Not on file    Relationship status: Not on file  . Intimate partner violence:    Fear of current or ex partner: Not on file    Emotionally abused: Not on file    Physically abused: Not on file    Forced sexual activity: Not on file  Other Topics Concern  . Not on file  Social History Narrative  . Not on file     Constitutional: Denies fever, malaise, fatigue, headache or abrupt weight changes.  Musculoskeletal: Pt reports left arm pain and swelling. Denies decrease in range of motion, difficulty with gait, muscle pain.  Skin: Pt reports redness and warmth of left arm. Denies rashes, lesions or ulcercations.   No other specific complaints in a complete review of systems (except as listed in HPI above).  Objective:   Physical Exam   BP 106/62   Pulse 82   Temp 97.9 F (36.6 C) (Oral)   Wt 184 lb (83.5 kg)   SpO2 95%   BMI 29.70 kg/m  Wt Readings from Last 3 Encounters:  12/21/17 184 lb (83.5 kg)  12/21/17 184 lb (83.5 kg)  12/19/17 184 lb (83.5 kg)    General: Appears his stated age, chronically ill appearing, in NAD. Skin: Warm, dry and intact. Redness and warmth starting a left elbow extending down forearm. Musculoskeletal: Pain with flexion, extension and rotation of the left elbow. Swelling noted from elbow down to fingers. Neurological: Alert and  oriented.   BMET    Component Value Date/Time   NA 137 12/21/2017 1717   NA 143 02/21/2015 1108   NA 137 08/05/2014 1839   K 4.5 12/21/2017 1717   K 3.6 08/05/2014 1839   CL 106 12/21/2017 1717   CL 106 08/05/2014 1839   CO2 25 12/21/2017 1717   CO2 23 08/05/2014 1839   GLUCOSE 258 (H) 12/21/2017 1717   GLUCOSE 235 (H) 08/05/2014 1839   BUN 35 (H) 12/21/2017 1717   BUN 26 02/21/2015 1108   BUN 19 08/05/2014 1839   CREATININE 1.58 (H) 12/21/2017 1717   CREATININE 1.20 08/05/2014 1839   CALCIUM 8.9 12/21/2017 1717   CALCIUM 8.6 (L) 08/05/2014 1839   GFRNONAA 38 (L) 12/21/2017 1717   GFRNONAA 57 (L) 08/05/2014 1839   GFRAA 45 (L) 12/21/2017 1717   GFRAA >60 08/05/2014 1839    Lipid Panel     Component Value Date/Time   CHOL 104 11/22/2016 1019   TRIG 154.0 (H) 11/22/2016 1019   HDL 43.80 11/22/2016 1019   CHOLHDL 2 11/22/2016 1019   VLDL 30.8 11/22/2016 1019   LDLCALC 29 11/22/2016 1019    CBC    Component Value Date/Time   WBC 18.7 (H) 12/21/2017 1717   RBC 4.56 12/21/2017 1717   HGB 13.9 12/21/2017 1717  HGB 13.3 08/05/2014 1839   HCT 39.6 (L) 12/21/2017 1717   HCT 40.1 08/05/2014 1839   PLT 218 12/21/2017 1717   PLT 246 08/05/2014 1839   MCV 86.8 12/21/2017 1717   MCV 83 08/05/2014 1839   MCH 30.4 12/21/2017 1717   MCHC 35.1 12/21/2017 1717   RDW 17.3 (H) 12/21/2017 1717   RDW 16.3 (H) 08/05/2014 1839   LYMPHSABS 1.6 12/21/2017 1717   LYMPHSABS 2.3 08/05/2014 1839   MONOABS 1.6 (H) 12/21/2017 1717   MONOABS 2.0 (H) 08/05/2014 1839   EOSABS 0.0 12/21/2017 1717   EOSABS 0.1 08/05/2014 1839   BASOSABS 0.1 12/21/2017 1717   BASOSABS 0.1 08/05/2014 1839    Hgb A1C Lab Results  Component Value Date   HGBA1C 6.7 (H) 02/15/2017           Assessment & Plan:   Cellulitis of Left Forearm:  Worsening Discussed giving 1 gm Rocephin IM, continuing Keflex and adding Levaquin, however given his comorbidities, I think he may need IV abx [facility] triage  called and notified, pt will be transported to ED by daughter in private vehicle  Return precautions discussed Webb Silversmith, NP

## 2017-12-22 ENCOUNTER — Other Ambulatory Visit: Payer: Self-pay

## 2017-12-22 DIAGNOSIS — I252 Old myocardial infarction: Secondary | ICD-10-CM | POA: Diagnosis not present

## 2017-12-22 DIAGNOSIS — I2510 Atherosclerotic heart disease of native coronary artery without angina pectoris: Secondary | ICD-10-CM | POA: Diagnosis present

## 2017-12-22 DIAGNOSIS — E876 Hypokalemia: Secondary | ICD-10-CM | POA: Diagnosis present

## 2017-12-22 DIAGNOSIS — E1142 Type 2 diabetes mellitus with diabetic polyneuropathy: Secondary | ICD-10-CM | POA: Diagnosis present

## 2017-12-22 DIAGNOSIS — I130 Hypertensive heart and chronic kidney disease with heart failure and stage 1 through stage 4 chronic kidney disease, or unspecified chronic kidney disease: Secondary | ICD-10-CM | POA: Diagnosis present

## 2017-12-22 DIAGNOSIS — N400 Enlarged prostate without lower urinary tract symptoms: Secondary | ICD-10-CM | POA: Diagnosis present

## 2017-12-22 DIAGNOSIS — L03114 Cellulitis of left upper limb: Secondary | ICD-10-CM | POA: Diagnosis present

## 2017-12-22 DIAGNOSIS — I482 Chronic atrial fibrillation: Secondary | ICD-10-CM | POA: Diagnosis present

## 2017-12-22 DIAGNOSIS — I509 Heart failure, unspecified: Secondary | ICD-10-CM | POA: Diagnosis not present

## 2017-12-22 DIAGNOSIS — M25612 Stiffness of left shoulder, not elsewhere classified: Secondary | ICD-10-CM | POA: Diagnosis not present

## 2017-12-22 DIAGNOSIS — E11649 Type 2 diabetes mellitus with hypoglycemia without coma: Secondary | ICD-10-CM | POA: Diagnosis not present

## 2017-12-22 DIAGNOSIS — R296 Repeated falls: Secondary | ICD-10-CM | POA: Diagnosis present

## 2017-12-22 DIAGNOSIS — Z85828 Personal history of other malignant neoplasm of skin: Secondary | ICD-10-CM | POA: Diagnosis not present

## 2017-12-22 DIAGNOSIS — Z9981 Dependence on supplemental oxygen: Secondary | ICD-10-CM | POA: Diagnosis not present

## 2017-12-22 DIAGNOSIS — J449 Chronic obstructive pulmonary disease, unspecified: Secondary | ICD-10-CM | POA: Diagnosis present

## 2017-12-22 DIAGNOSIS — E1165 Type 2 diabetes mellitus with hyperglycemia: Secondary | ICD-10-CM | POA: Diagnosis present

## 2017-12-22 DIAGNOSIS — F419 Anxiety disorder, unspecified: Secondary | ICD-10-CM | POA: Diagnosis present

## 2017-12-22 DIAGNOSIS — K219 Gastro-esophageal reflux disease without esophagitis: Secondary | ICD-10-CM | POA: Diagnosis present

## 2017-12-22 DIAGNOSIS — N183 Chronic kidney disease, stage 3 (moderate): Secondary | ICD-10-CM | POA: Diagnosis present

## 2017-12-22 DIAGNOSIS — I69393 Ataxia following cerebral infarction: Secondary | ICD-10-CM | POA: Diagnosis not present

## 2017-12-22 DIAGNOSIS — C3432 Malignant neoplasm of lower lobe, left bronchus or lung: Secondary | ICD-10-CM | POA: Diagnosis present

## 2017-12-22 DIAGNOSIS — E785 Hyperlipidemia, unspecified: Secondary | ICD-10-CM | POA: Diagnosis present

## 2017-12-22 DIAGNOSIS — I69354 Hemiplegia and hemiparesis following cerebral infarction affecting left non-dominant side: Secondary | ICD-10-CM | POA: Diagnosis not present

## 2017-12-22 DIAGNOSIS — I5032 Chronic diastolic (congestive) heart failure: Secondary | ICD-10-CM | POA: Diagnosis present

## 2017-12-22 DIAGNOSIS — E890 Postprocedural hypothyroidism: Secondary | ICD-10-CM | POA: Diagnosis present

## 2017-12-22 DIAGNOSIS — Z87442 Personal history of urinary calculi: Secondary | ICD-10-CM | POA: Diagnosis not present

## 2017-12-22 LAB — GLUCOSE, CAPILLARY
GLUCOSE-CAPILLARY: 173 mg/dL — AB (ref 70–99)
GLUCOSE-CAPILLARY: 341 mg/dL — AB (ref 70–99)
GLUCOSE-CAPILLARY: 226 mg/dL — AB (ref 70–99)
GLUCOSE-CAPILLARY: 177 mg/dL — AB (ref 70–99)

## 2017-12-22 LAB — BASIC METABOLIC PANEL
Anion gap: 6 (ref 5–15)
BUN: 32 mg/dL — AB (ref 8–23)
CALCIUM: 9 mg/dL (ref 8.9–10.3)
CO2: 24 mmol/L (ref 22–32)
CREATININE: 1.28 mg/dL — AB (ref 0.61–1.24)
Chloride: 109 mmol/L (ref 98–111)
GFR, EST AFRICAN AMERICAN: 58 mL/min — AB (ref >60)
GFR, EST NON AFRICAN AMERICAN: 50 mL/min — AB (ref >60)
Glucose, Bld: 148 mg/dL — ABNORMAL HIGH (ref 70–99)
Potassium: 4.1 mmol/L (ref 3.5–5.1)
SODIUM: 139 mmol/L (ref 135–145)

## 2017-12-22 LAB — CBC
HCT: 40.7 % (ref 40.0–52.0)
Hemoglobin: 13.8 g/dL (ref 13.0–18.0)
MCH: 29.9 pg (ref 26.0–34.0)
MCHC: 34 g/dL (ref 32.0–36.0)
MCV: 87.9 fL (ref 80.0–100.0)
PLATELETS: 216 10*3/uL (ref 150–440)
RBC: 4.63 MIL/uL (ref 4.40–5.90)
RDW: 17.8 % — AB (ref 11.5–14.5)
WBC: 15.3 10*3/uL — ABNORMAL HIGH (ref 3.8–10.6)

## 2017-12-22 MED ORDER — ACETAMINOPHEN 650 MG RE SUPP: 650.0000 mg | Freq: Four times a day (QID) | RECTAL | Status: DC | PRN

## 2017-12-22 MED ORDER — DORZOLAMIDE HCL 2 % OP SOLN
1.0000 [drp] | Freq: Three times a day (TID) | OPHTHALMIC | Status: DC
  Administered 2017-12-22 – 2017-12-26 (×13): 1 [drp] via OPHTHALMIC
  Filled 2017-12-22: qty 10

## 2017-12-22 MED ORDER — FLUTICASONE FUROATE-VILANTEROL 200-25 MCG/INH IN AEPB
1.0000 | INHALATION_SPRAY | Freq: Every day | RESPIRATORY_TRACT | Status: DC
  Administered 2017-12-22 – 2017-12-26 (×5): 1 via RESPIRATORY_TRACT
  Filled 2017-12-22: qty 28

## 2017-12-22 MED ORDER — TIOTROPIUM BROMIDE MONOHYDRATE 18 MCG IN CAPS
1.0000 | ORAL_CAPSULE | Freq: Every day | RESPIRATORY_TRACT | Status: DC
  Administered 2017-12-22 – 2017-12-26 (×5): 18 ug via RESPIRATORY_TRACT
  Filled 2017-12-22 (×2): qty 5

## 2017-12-22 MED ORDER — SODIUM CHLORIDE 0.9 % IV SOLN
3.0000 g | Freq: Three times a day (TID) | INTRAVENOUS | Status: DC
  Administered 2017-12-22: 3 g via INTRAVENOUS
  Filled 2017-12-22 (×3): qty 3

## 2017-12-22 MED ORDER — TRAZODONE HCL 50 MG PO TABS
25.0000 mg | ORAL_TABLET | Freq: Every evening | ORAL | Status: DC | PRN
  Administered 2017-12-25: 25 mg via ORAL
  Filled 2017-12-22: qty 1

## 2017-12-22 MED ORDER — BISACODYL 5 MG PO TBEC
5.0000 mg | DELAYED_RELEASE_TABLET | Freq: Every day | ORAL | Status: DC | PRN
  Administered 2017-12-26: 5 mg via ORAL
  Filled 2017-12-22: qty 1

## 2017-12-22 MED ORDER — TAMSULOSIN HCL 0.4 MG PO CAPS
0.4000 mg | ORAL_CAPSULE | Freq: Every day | ORAL | Status: DC
  Administered 2017-12-22 – 2017-12-26 (×5): 0.4 mg via ORAL
  Filled 2017-12-22 (×5): qty 1

## 2017-12-22 MED ORDER — NITROGLYCERIN 0.4 MG SL SUBL: 0.4000 mg | SUBLINGUAL_TABLET | SUBLINGUAL | Status: DC | PRN

## 2017-12-22 MED ORDER — SODIUM CHLORIDE 0.9 % IV SOLN
Freq: Once | INTRAVENOUS | Status: AC
  Administered 2017-12-22: 06:00:00 via INTRAVENOUS

## 2017-12-22 MED ORDER — ONDANSETRON HCL 4 MG PO TABS: 4.0000 mg | ORAL_TABLET | Freq: Four times a day (QID) | ORAL | Status: DC | PRN

## 2017-12-22 MED ORDER — ONDANSETRON HCL 4 MG/2ML IJ SOLN: 4.0000 mg | Freq: Four times a day (QID) | INTRAMUSCULAR | Status: DC | PRN

## 2017-12-22 MED ORDER — CITALOPRAM HYDROBROMIDE 20 MG PO TABS
10.0000 mg | ORAL_TABLET | Freq: Every day | ORAL | Status: DC
  Administered 2017-12-22 – 2017-12-26 (×5): 10 mg via ORAL
  Filled 2017-12-22 (×5): qty 1

## 2017-12-22 MED ORDER — FINASTERIDE 5 MG PO TABS
5.0000 mg | ORAL_TABLET | Freq: Every day | ORAL | Status: DC
  Administered 2017-12-22 – 2017-12-26 (×5): 5 mg via ORAL
  Filled 2017-12-22 (×5): qty 1

## 2017-12-22 MED ORDER — INSULIN DETEMIR 100 UNIT/ML FLEXPEN: 42.0000 [IU] | PEN_INJECTOR | Freq: Every day | SUBCUTANEOUS | Status: DC

## 2017-12-22 MED ORDER — ALBUTEROL SULFATE (2.5 MG/3ML) 0.083% IN NEBU: 2.5000 mg | INHALATION_SOLUTION | RESPIRATORY_TRACT | Status: DC | PRN

## 2017-12-22 MED ORDER — LATANOPROST 0.005 % OP SOLN
1.0000 [drp] | Freq: Every day | OPHTHALMIC | Status: DC
  Administered 2017-12-22 – 2017-12-25 (×4): 1 [drp] via OPHTHALMIC
  Filled 2017-12-22: qty 2.5

## 2017-12-22 MED ORDER — DOCUSATE SODIUM 100 MG PO CAPS
100.0000 mg | ORAL_CAPSULE | Freq: Two times a day (BID) | ORAL | Status: DC
  Administered 2017-12-22 – 2017-12-26 (×9): 100 mg via ORAL
  Filled 2017-12-22 (×9): qty 1

## 2017-12-22 MED ORDER — METOPROLOL SUCCINATE ER 50 MG PO TB24
50.0000 mg | ORAL_TABLET | Freq: Two times a day (BID) | ORAL | Status: DC
  Administered 2017-12-22 – 2017-12-26 (×9): 50 mg via ORAL
  Filled 2017-12-22 (×9): qty 1

## 2017-12-22 MED ORDER — APIXABAN 5 MG PO TABS
5.0000 mg | ORAL_TABLET | Freq: Two times a day (BID) | ORAL | Status: DC
  Administered 2017-12-22 – 2017-12-26 (×9): 5 mg via ORAL
  Filled 2017-12-22 (×9): qty 1

## 2017-12-22 MED ORDER — LEVOTHYROXINE SODIUM 50 MCG PO TABS
50.0000 ug | ORAL_TABLET | Freq: Every day | ORAL | Status: DC
  Administered 2017-12-22 – 2017-12-26 (×5): 50 ug via ORAL
  Filled 2017-12-22 (×5): qty 1

## 2017-12-22 MED ORDER — ATORVASTATIN CALCIUM 20 MG PO TABS
40.0000 mg | ORAL_TABLET | Freq: Every day | ORAL | Status: DC
  Administered 2017-12-22 – 2017-12-25 (×4): 40 mg via ORAL
  Filled 2017-12-22 (×4): qty 2

## 2017-12-22 MED ORDER — VANCOMYCIN HCL IN DEXTROSE 1-5 GM/200ML-% IV SOLN: 1000.0000 mg | INTRAVENOUS | Status: DC

## 2017-12-22 MED ORDER — VANCOMYCIN HCL 10 G IV SOLR
1250.0000 mg | INTRAVENOUS | Status: DC
  Administered 2017-12-22 – 2017-12-26 (×5): 1250 mg via INTRAVENOUS
  Filled 2017-12-22 (×5): qty 1250

## 2017-12-22 MED ORDER — TORSEMIDE 20 MG PO TABS
40.0000 mg | ORAL_TABLET | Freq: Two times a day (BID) | ORAL | Status: DC
  Administered 2017-12-22 – 2017-12-23 (×2): 40 mg via ORAL
  Filled 2017-12-22 (×3): qty 2

## 2017-12-22 MED ORDER — PREDNISONE 10 MG PO TABS
10.0000 mg | ORAL_TABLET | Freq: Every day | ORAL | Status: DC
  Administered 2017-12-22 – 2017-12-26 (×5): 10 mg via ORAL
  Filled 2017-12-22 (×5): qty 1

## 2017-12-22 MED ORDER — POTASSIUM CHLORIDE CRYS ER 20 MEQ PO TBCR: 60.0000 meq | EXTENDED_RELEASE_TABLET | Freq: Two times a day (BID) | ORAL | Status: DC

## 2017-12-22 MED ORDER — INSULIN DETEMIR 100 UNIT/ML ~~LOC~~ SOLN
42.0000 [IU] | Freq: Every day | SUBCUTANEOUS | Status: DC
  Administered 2017-12-22 – 2017-12-24 (×3): 42 [IU] via SUBCUTANEOUS
  Filled 2017-12-22 (×4): qty 0.42

## 2017-12-22 MED ORDER — ALPRAZOLAM 0.25 MG PO TABS
0.2500 mg | ORAL_TABLET | Freq: Every evening | ORAL | Status: DC | PRN
  Administered 2017-12-25: 0.25 mg via ORAL
  Filled 2017-12-22: qty 1

## 2017-12-22 MED ORDER — ACETAMINOPHEN 325 MG PO TABS
650.0000 mg | ORAL_TABLET | Freq: Four times a day (QID) | ORAL | Status: DC | PRN
  Administered 2017-12-24 – 2017-12-25 (×2): 650 mg via ORAL
  Filled 2017-12-22 (×2): qty 2

## 2017-12-22 MED ORDER — METOLAZONE 5 MG PO TABS
5.0000 mg | ORAL_TABLET | Freq: Every day | ORAL | Status: DC | PRN
  Administered 2017-12-22: 5 mg via ORAL
  Filled 2017-12-22 (×2): qty 1

## 2017-12-22 MED ORDER — INSULIN ASPART 100 UNIT/ML ~~LOC~~ SOLN
0.0000 [IU] | Freq: Three times a day (TID) | SUBCUTANEOUS | Status: DC
  Administered 2017-12-22: 11 [IU] via SUBCUTANEOUS
  Administered 2017-12-22: 5 [IU] via SUBCUTANEOUS
  Administered 2017-12-22: 3 [IU] via SUBCUTANEOUS
  Administered 2017-12-23: 11 [IU] via SUBCUTANEOUS
  Administered 2017-12-23 – 2017-12-24 (×2): 5 [IU] via SUBCUTANEOUS
  Administered 2017-12-24 – 2017-12-25 (×3): 8 [IU] via SUBCUTANEOUS
  Administered 2017-12-26 (×2): 3 [IU] via SUBCUTANEOUS
  Filled 2017-12-22 (×11): qty 1

## 2017-12-22 MED ORDER — GABAPENTIN 100 MG PO CAPS
100.0000 mg | ORAL_CAPSULE | Freq: Three times a day (TID) | ORAL | Status: DC
  Administered 2017-12-22 – 2017-12-25 (×10): 100 mg via ORAL
  Filled 2017-12-22 (×10): qty 1

## 2017-12-22 MED ORDER — INSULIN ASPART 100 UNIT/ML ~~LOC~~ SOLN
0.0000 [IU] | Freq: Every day | SUBCUTANEOUS | Status: DC
  Administered 2017-12-23: 2 [IU] via SUBCUTANEOUS
  Administered 2017-12-24 – 2017-12-25 (×2): 4 [IU] via SUBCUTANEOUS
  Filled 2017-12-22 (×3): qty 1

## 2017-12-22 MED ORDER — SODIUM CHLORIDE 0.9 % IV SOLN
3.0000 g | Freq: Four times a day (QID) | INTRAVENOUS | Status: DC
  Administered 2017-12-22 – 2017-12-26 (×16): 3 g via INTRAVENOUS
  Filled 2017-12-22 (×20): qty 3

## 2017-12-22 MED ORDER — EZETIMIBE 10 MG PO TABS
10.0000 mg | ORAL_TABLET | Freq: Every day | ORAL | Status: DC
  Administered 2017-12-22 – 2017-12-26 (×5): 10 mg via ORAL
  Filled 2017-12-22 (×5): qty 1

## 2017-12-22 MED ORDER — CLOTRIMAZOLE 1 % EX CREA
1.0000 "application " | TOPICAL_CREAM | Freq: Two times a day (BID) | CUTANEOUS | Status: DC
  Administered 2017-12-22 – 2017-12-26 (×5): 1 via TOPICAL
  Filled 2017-12-22: qty 15

## 2017-12-22 MED ORDER — ALBUTEROL SULFATE HFA 108 (90 BASE) MCG/ACT IN AERS: 1.0000 | INHALATION_SPRAY | RESPIRATORY_TRACT | Status: DC | PRN

## 2017-12-22 NOTE — Progress Notes (Signed)
Advanced Care Plan.  Purpose of Encounter: CODE STATUS. Parties in Attendance: The patient, his daughter and daughter family members, me. Patient's Decisional Capacity: Yes. Medical Story: [name]. is a 84 y.o. male with a history of CAD, atrial fibrillation, COPD, diabetes who sent to the ED for cellulitis of his left upper extremity by primary care.    He is admitted for left leg cellulitis and wound infection.  I discussed with the patient about his current condition, prognosis and CODE STATUS.  He stated that he want to be resuscitated and intubated to get him back if necessary.  But he does not want to be put on machine for long time. Plan:  Code Status: Full code. Time spent discussing advance care planning: 17 minutes.

## 2017-12-22 NOTE — Progress Notes (Addendum)
Pharmacy Antibiotic Note  [name]. is a 84 y.o. male admitted on 12/21/2017 with wound infectionPharmacy has been consulted for vanc/unasyn dosing. Patient received vanc 1g IV x 1 2200.  Plan: Vancomycin dose adjusted due to improved creatinine clearance - increase dose to 1.25 gm IV Q24H, predicted trough 15 mcg/ml. Pharmacy will continue to follow and adjust as needed to maintain trough 15 to 20 mcg/ml. Stack dosing was not given overnight - RN charged that patient already received dose in ED but it was not an appropriate stack. Begin dosing this morning.   Unasyn also modified to 3 gm IV Q6H  Height: 5\' 6"  (167.6 cm) Weight: 188 lb 0.8 oz (85.3 kg) IBW/kg (Calculated) : 63.8  Temp (24hrs), Avg:97.9 F (36.6 C), Min:97.9 F (36.6 C), Max:98 F (36.7 C)  Recent Labs  Lab 12/21/17 1717 12/22/17 0623  WBC 18.7* 15.3*  CREATININE 1.58* 1.28*  LATICACIDVEN 1.8  --     Estimated Creatinine Clearance: 44 mL/min (A) (by C-G formula based on SCr of 1.28 mg/dL (H)).    Allergies  Allergen Reactions  . Contrast Media [Iodinated Diagnostic Agents] Shortness Of Breath  . Morphine And Related Other (See Comments)    Hallucinations   . Niacin And Related Dermatitis  . Other     Other reaction(s): SHORTNESS OF BREATH  . Amlodipine Rash    Thank you for allowing pharmacy to be a part of this patient's care.  Ioanna Colquhoun A. Jordan Hawks, PharmD, BCPS Clinical Pharmacist 12/22/2017

## 2017-12-22 NOTE — H&P (Signed)
[name] at [facility] NAME: [name]    MR#:  [id number]  DATE OF BIRTH:  1931-05-02  DATE OF ADMISSION:  12/21/2017  PRIMARY CARE PHYSICIAN: Jearld Fenton, NP   REQUESTING/REFERRING PHYSICIAN:   CHIEF COMPLAINT:   Chief Complaint  Patient presents with  . Cellulitis    HISTORY OF PRESENT ILLNESS: [name]  is a 84 y.o. male with a known history of diabetes type 2, CAD, chronic atrial fibrillation, on anticoagulation, diastolic CHF, COPD, remote stroke with left hemiparesis, peripheral neuropathy and other comorbidities. Patient presents to emergency room for moderate to severe left upper extremity swelling, redness and tenderness going on for the past week.  He was started on Keflex by his primary care physician, 2 days ago, without any improvement.  Patient did not check his temperature but he admits to subjective fever and chills.  No open wounds of recent trauma to the upper extremities, although patient admits to frequent falls. Patient had a similar episode, at his left lower extremity. Blood test done emergency room show creatinine at 1.58, glucose level at 258, lactic acid level at 1.8.  WBCs 18.7. Left upper extremity venous ultrasound is negative for DVT. Patient is admitted for further evaluation and treatment.  PAST MEDICAL HISTORY:   Past Medical History:  Diagnosis Date  . Arthritis   . CAD    a. MI 01/29/1996 tx'd w/ TPA @ [place]; b. Myoview 06/2005: EF 50%, scar @ apex, mild peri-infarct ischemia  . Cancer ([place])    skin  . Chronic atrial fibrillation ([place])    a. since 2006; b. on warfarin  . Chronic diastolic CHF (congestive heart failure) ([place])    a. echo 04/2006: EF lower limits of nl, mod LVH, mild aortic root dilatation, & mild MR, biatrial enlargement; b. echo 04/2013: EF 60%, mod dilated LA, mild MR & TR, mod pulm HTN w/ RV systolic pressure 53, c. echo 04/21/14: EF 55-60%, unable to exclude WMA,  severely dilated LA 6.6 cm, nl RVSP, mildly dilated aortic root  . COPD (chronic obstructive pulmonary disease) ([facility])    oxygen prn at home  . CVA [id number]   x2  . DM   . Falls   . GERD (gastroesophageal reflux disease)   . History of kidney stones   . HYPERLIPIDEMIA   . HYPERTENSION   . Kidney stone    a. s/p left ureteral stenting 04/24/14  . Left arm weakness    limited movement. S/P fall injury  . Neuropathy of both feet   . Poor balance   . Wears dentures    full upper and lower    PAST SURGICAL HISTORY:  Past Surgical History:  Procedure Laterality Date  . BLADDER SURGERY     stent placement   . CARDIAC CATHETERIZATION  1997   [facility]  . CAROTID STENT INSERTION  1997  . CATARACT EXTRACTION W/PHACO Left 10/11/2017   Procedure: CATARACT EXTRACTION PHACO AND INTRAOCULAR LENS PLACEMENT ([place]) COMPLICATED LEFT DIABETIC;  Surgeon: Leandrew Koyanagi, MD;  Location: [facility];  Service: Ophthalmology;  Laterality: Left;  MALYUGIN Diabetic - insulin  . CATARACT EXTRACTION W/PHACO Right 11/30/2017   Procedure: CATARACT EXTRACTION PHACO AND INTRAOCULAR LENS PLACEMENT ([place]) COMPLICATED  RIGHT DIABETIC;  Surgeon: Leandrew Koyanagi, MD;  Location: [facility];  Service: Ophthalmology;  Laterality: Right;  Diabetic - insulin  . CIRCUMCISION  2016  . CORONARY ANGIOPLASTY  1997   s/p stent placement x  2   . CYSTOSCOPY W/ URETERAL STENT REMOVAL Left 10/09/2014   Procedure: CYSTOSCOPY WITH STENT REMOVAL;  Surgeon: Hollice Espy, MD;  Location: [facility];  Service: Urology;  Laterality: Left;  . CYSTOSCOPY WITH STENT PLACEMENT Left 10/09/2014   Procedure: CYSTOSCOPY WITH STENT PLACEMENT;  Surgeon: Hollice Espy, MD;  Location: [facility];  Service: Urology;  Laterality: Left;  . KIDNEY SURGERY  05/2013   s/p stent placement   . stents ureters Bilateral   . TONSILLECTOMY AND ADENOIDECTOMY  1959  . URETEROSCOPY WITH HOLMIUM LASER LITHOTRIPSY Left 10/09/2014   Procedure:  URETEROSCOPY WITH HOLMIUM LASER LITHOTRIPSY;  Surgeon: Hollice Espy, MD;  Location: [facility];  Service: Urology;  Laterality: Left;    SOCIAL HISTORY:  Social History   Tobacco Use  . Smoking status: Former Smoker    Packs/day: 2.00    Years: 40.00    Pack years: 80.00    Types: Cigarettes    Last attempt to quit: 05/24/1990    Years since quitting: 27.6  . Smokeless tobacco: Former Systems developer    Quit date: 05/24/1990  Substance Use Topics  . Alcohol use: No    FAMILY HISTORY:  Family History  Problem Relation Age of Onset  . Heart disease Mother   . Heart disease Maternal Grandmother   . Diabetes Maternal Grandmother   . Cancer Neg Hx   . Stroke Neg Hx     DRUG ALLERGIES:  Allergies  Allergen Reactions  . Contrast Media [Iodinated Diagnostic Agents] Shortness Of Breath  . Morphine And Related Other (See Comments)    Hallucinations   . Niacin And Related Dermatitis  . Other     Other reaction(s): SHORTNESS OF BREATH  . Amlodipine Rash    REVIEW OF SYSTEMS:   CONSTITUTIONAL: Positive for subjective fever and chills, fatigue and generalized weakness.  EYES: No changes in vision.  EARS, NOSE, AND THROAT: No tinnitus or ear pain.  RESPIRATORY: No cough, shortness of breath, wheezing or hemoptysis.  CARDIOVASCULAR: No chest pain, orthopnea, edema.  GASTROINTESTINAL: No nausea, vomiting, diarrhea or abdominal pain.  GENITOURINARY: No dysuria, hematuria.  ENDOCRINE: No polyuria, nocturia. HEMATOLOGY: No bleeding. SKIN: No rash or lesion. MUSCULOSKELETAL: Positive for left upper extremity swelling, redness and tenderness. NEUROLOGIC: Positive for left-sided hemiparesis, status post remote stroke.  PSYCHIATRY: No anxiety or depression.   MEDICATIONS AT HOME:  Prior to Admission medications   Medication Sig Start Date End Date Taking? Authorizing Provider  acetaminophen (TYLENOL) 650 MG CR tablet Take 650 mg by mouth every 8 (eight) hours as needed for pain.    Yes  [provider]  albuterol (PROAIR HFA) 108 (90 Base) MCG/ACT inhaler USE 1 TO 2 INHALATIONS EVERY 4 HOURS AS NEEDED 11/09/17  Yes Wilhelmina Mcardle, MD  ALPRAZolam Duanne Moron) 0.25 MG tablet Take 1 tablet (0.25 mg total) by mouth at bedtime as needed for anxiety. 11/09/17  Yes Jearld Fenton, NP  atorvastatin (LIPITOR) 40 MG tablet Take 1 tablet (40 mg total) by mouth daily. Patient taking differently: Take 40 mg by mouth daily at 6 PM.  10/17/17  Yes Baity, Coralie Keens, NP  cephALEXin (KEFLEX) 250 MG capsule Take 1 capsule (250 mg total) by mouth 3 (three) times daily. 12/19/17  Yes Jearld Fenton, NP  citalopram (CELEXA) 10 MG tablet TAKE 1 TABLET DAILY 12/19/17  Yes Baity, Coralie Keens, NP  clotrimazole (LOTRIMIN) 1 % cream Apply 1 application topically 2 (two) times daily. 11/18/17  Yes Jinny Sanders, MD  Diclofenac Sodium 1.5 % SOLN Place 2 mLs onto the skin 4 (four) times daily. 07/15/16  Yes Copland, Frederico Hamman, MD  dorzolamide (TRUSOPT) 2 % ophthalmic solution Place 1 drop into both eyes 3 (three) times daily.  03/31/16  Yes [provider]  ELIQUIS 5 MG TABS tablet TAKE 1 TABLET TWICE A DAY 09/19/17  Yes Gollan, Kathlene November, MD  ezetimibe (ZETIA) 10 MG tablet Take 1 tablet (10 mg total) by mouth daily. 10/17/17  Yes Jearld Fenton, NP  finasteride (PROSCAR) 5 MG tablet TAKE 1 TABLET DAILY 06/29/17  Yes Jearld Fenton, NP  gabapentin (NEURONTIN) 100 MG capsule TAKE 1 CAPSULE THREE TIMES A DAY 09/22/17  Yes Baity, Coralie Keens, NP  glipiZIDE (GLUCOTROL) 5 MG tablet TAKE 1 TABLET TWICE A DAY BEFORE MEALS Patient taking differently: Take 5 mg by mouth 2 (two) times daily before a meal.  10/31/17  Yes Baity, Coralie Keens, NP  HUMALOG KWIKPEN 100 UNIT/ML KiwkPen INJECT 18 UNITS WITH BREAKFAST, 20 UNITS WITH LUNCH, AND 16 UNITS WITH DINNER Patient taking differently: Inject 16-20 Units into the skin 3 (three) times daily. 18 units at breakfast, 20 units at lunch, and 16 units at dinner 10/21/17  Yes Baity, Coralie Keens,  NP  JANUVIA 100 MG tablet TAKE 1 TABLET DAILY 07/25/17  Yes Jearld Fenton, NP  latanoprost (XALATAN) 0.005 % ophthalmic solution Place 1 drop into both eyes at bedtime.  04/18/16  Yes [provider]  LEVEMIR FLEXTOUCH 100 UNIT/ML Pen INJECT 42 UNITS UNDER THE SKIN DAILY AT 10 P.M. Patient taking differently: Inject 42 Units into the skin at bedtime.  08/16/17  Yes Baity, Coralie Keens, NP  levothyroxine (SYNTHROID, LEVOTHROID) 50 MCG tablet TAKE 1 TABLET DAILY BEFORE BREAKFAST 09/29/17  Yes Baity, Coralie Keens, NP  metolazone (ZAROXOLYN) 5 MG tablet TAKE 1 TABLET DAILY AS NEEDED Patient taking differently: Take 5 mg by mouth daily as needed (swelling).  06/14/17  Yes Minna Merritts, MD  metoprolol succinate (TOPROL-XL) 50 MG 24 hr tablet TAKE 1 TABLET TWICE A DAY 10/10/17  Yes Baity, Coralie Keens, NP  nitroGLYCERIN (NITROSTAT) 0.4 MG SL tablet Place 0.4 mg under the tongue every 5 (five) minutes as needed.     Yes [provider]  nystatin-triamcinolone (MYCOLOG II) cream Apply 1 application topically 2 (two) times daily.  03/12/14  Yes [provider]  potassium chloride SA (K-DUR,KLOR-CON) 20 MEQ tablet Take 3 tablets (60 mEq total) by mouth 2 (two) times daily. 10/24/17  Yes Baity, Coralie Keens, NP  predniSONE (DELTASONE) 10 MG tablet TAKE 1 TABLET DAILY WITH BREAKFAST 10/26/17  Yes Baity, Coralie Keens, NP  SYMBICORT 160-4.5 MCG/ACT inhaler USE 2 INHALATIONS TWICE A DAY Patient taking differently: Inhale 2 puffs into the lungs 2 (two) times daily.  08/31/17  Yes Wilhelmina Mcardle, MD  tamsulosin (FLOMAX) 0.4 MG CAPS capsule TAKE 1 CAPSULE DAILY 10/21/17  Yes Baity, Coralie Keens, NP  tiotropium (SPIRIVA HANDIHALER) 18 MCG inhalation capsule Place 1 capsule (18 mcg total) daily into inhaler and inhale. 02/14/17  Yes Wilhelmina Mcardle, MD  torsemide (DEMADEX) 20 MG tablet TAKE 2 TABLETS TWICE A DAY 12/13/17  Yes Gollan, Kathlene November, MD  Insulin Pen Needle (BD PEN NEEDLE NANO U/F) 32G X 4 MM MISC USE THREE TIMES  A DAY FOR INSULIN ADMINISTRATION. 08/16/17   Jearld Fenton, NP  Insulin Syringe-Needle U-100 30G X 1/2" 1 ML MISC 1 each by Does not apply route 3 (three) times daily.  07/19/14   Jearld Fenton, NP      PHYSICAL EXAMINATION:   VITAL SIGNS: Blood pressure 123/78, pulse 69, temperature 97.9 F (36.6 C), temperature source Oral, resp. rate 16, height 5\' 6"  (1.676 m), weight 83.5 kg, SpO2 98 %.  GENERAL:  84 y.o.-year-old patient lying in the bed with moderate distress, secondary to left upper extremity discomfort.  EYES: Pupils equal, round, reactive to light and accommodation. No scleral icterus. Extraocular muscles intact.  HEENT: Head atraumatic, normocephalic. Oropharynx and nasopharynx clear.  NECK:  Supple, no jugular venous distention. No thyroid enlargement, no tenderness.  LUNGS: Reduced sounds bilaterally, no wheezing, rales,rhonchi or crepitation. No use of accessory muscles of respiration.  CARDIOVASCULAR: S1, S2 normal. No S3/S4.  ABDOMEN: Soft, nontender, nondistended. Bowel sounds present. No organomegaly or mass.  EXTREMITIES: There is diffuse swelling and erythema of the left upper extremity, from the wrist all the way to mid upper arm.  There is severe tenderness and increased warmth with palpation.  Range of motion at left elbow joint is severely reduced due to swelling and tenderness.  No joint effusion appreciated.  No crepitus or fluctuance.  No wounds, no discharge, no bleeding. Left lower extremities noted with a chronic, healing wound, on the shin. NEUROLOGIC: Left-sided weakness is noted, 3 out of 5 muscle strength throughout, and both left upper and lower extremity. PSYCHIATRIC: The patient is alert and oriented x 3.  SKIN: See the description above, under musculoskeletal exam.   LABORATORY PANEL:   CBC Recent Labs  Lab 12/21/17 1717  WBC 18.7*  HGB 13.9  HCT 39.6*  PLT 218  MCV 86.8  MCH 30.4  MCHC 35.1  RDW 17.3*  LYMPHSABS 1.6  MONOABS 1.6*  EOSABS 0.0   BASOSABS 0.1   ------------------------------------------------------------------------------------------------------------------  Chemistries  Recent Labs  Lab 12/21/17 1717  NA 137  K 4.5  CL 106  CO2 25  GLUCOSE 258*  BUN 35*  CREATININE 1.58*  CALCIUM 8.9  AST 13*  ALT 13  ALKPHOS 58  BILITOT 0.6   ------------------------------------------------------------------------------------------------------------------ estimated creatinine clearance is 35.3 mL/min (A) (by C-G formula based on SCr of 1.58 mg/dL (H)). ------------------------------------------------------------------------------------------------------------------ No results for input(s): TSH, T4TOTAL, T3FREE, THYROIDAB in the last 72 hours.  Invalid input(s): FREET3   Coagulation profile No results for input(s): INR, PROTIME in the last 168 hours. ------------------------------------------------------------------------------------------------------------------- No results for input(s): DDIMER in the last 72 hours. -------------------------------------------------------------------------------------------------------------------  Cardiac Enzymes No results for input(s): CKMB, TROPONINI, MYOGLOBIN in the last 168 hours.  Invalid input(s): CK ------------------------------------------------------------------------------------------------------------------ Invalid input(s): POCBNP  ---------------------------------------------------------------------------------------------------------------  Urinalysis    Component Value Date/Time   COLORURINE YELLOW (A) 10/10/2014 0352   APPEARANCEUR HAZY (A) 10/10/2014 0352   APPEARANCEUR Cloudy (A) 09/20/2014 1440   LABSPEC 1.005 10/10/2014 0352   LABSPEC 1.010 08/05/2014 2030   PHURINE 5.0 10/10/2014 0352   GLUCOSEU NEGATIVE 10/10/2014 0352   GLUCOSEU 50 mg/dL 08/05/2014 2030   GLUCOSEU NEGATIVE 02/14/2014 1257   HGBUR 3+ (A) 10/10/2014 0352   BILIRUBINUR  NEGATIVE 10/10/2014 0352   BILIRUBINUR Negative 09/20/2014 1440   BILIRUBINUR Negative 08/05/2014 2030   KETONESUR NEGATIVE 10/10/2014 0352   PROTEINUR 30 (A) 10/10/2014 0352   UROBILINOGEN negative 05/29/2014 1654   UROBILINOGEN 0.2 02/14/2014 1257   NITRITE NEGATIVE 10/10/2014 0352   LEUKOCYTESUR 3+ (A) 10/10/2014 0352   LEUKOCYTESUR 2+ (A) 09/20/2014 1440   LEUKOCYTESUR 3+ 08/05/2014 2030     RADIOLOGY: US Venous Img Upper Uni Left  Result Date: 12/21/2017 CLINICAL  DATA:  Left upper extremity swelling and pain for 4 days EXAM: LEFT UPPER EXTREMITY VENOUS DUPLEX ULTRASOUND TECHNIQUE: Doppler venous assessment of the left upper extremity deep venous system was performed, including characterization of spectral flow, compressibility, and phasicity. COMPARISON:  None. FINDINGS: Left jugular vein is compressible. Color Doppler imaging demonstrates patency. Doppler analysis demonstrates a phasic venous waveform. Color Doppler imaging of the left subclavian vein demonstrates patency. Doppler analysis demonstrates a phasic venous waveform. There is complete compressibility of the left axillary, brachial, radial, and ulnar veins. Doppler analysis demonstrates phasicity. No evidence of superficial vein thrombosis. IMPRESSION: No evidence of left upper extremity DVT. Electronically Signed   By: Marybelle Killings M.D.   On: 12/21/2017 21:28    EKG: Orders placed or performed in visit on 01/10/17  . EKG 12-Lead    IMPRESSION AND PLAN:  1.  Acute left upper extremity cellulitis.  Patient failed treatment with Keflex as outpatient.  We will start IV antibiotics with Unasyn and vancomycin.  We will continue to monitor clinically closely. 2.  Chronic atrial fibrillation, currently rate controlled.  Will continue maintenance therapy with beta-blockers.  Continue anticoagulation with Eliquis, dose adjusted to 2.5 mg twice a day, based on patient's age and kidney function. 3.  CKD 3, stable, creatinine is at  baseline.  Continue to monitor kidney function closely and avoid nephrotoxic medications. 4.  Diabetes type 2 with hyperglycemia.  Will start low-carb diet and monitor blood sugars before meals and at bedtime.  Use insulin treatment during the hospital stay. 5.  Left hemiparesis, status post remote stroke. 6.  Unstable gait and generalized weakness.  Will have PT and OT evaluate and treat the patient.  All the records are reviewed and case discussed with ED provider. Management plans discussed with the patient, family and they are in agreement.  CODE STATUS: Full Code Status History    Date Active Date Inactive Code Status Order ID Comments User Context   10/10/2016 1520 10/11/2016 1829 Full Code [id number]  Demetrios Loll, MD Inpatient   10/10/2014 0258 10/11/2014 1704 Full Code [id number]  Harrie Foreman, MD Inpatient       TOTAL TIME TAKING CARE OF THIS PATIENT: 50 minutes.    [name] M.D on 12/22/2017 at 12:14 AM  Between 7am to 6pm - Pager - [phone]  After 6pm go to [email or web address] - password [email or web address] [facility] [place] [facility]  [phone]  CC: Primary care physician; Jearld Fenton, NP

## 2017-12-22 NOTE — Progress Notes (Signed)
Pharmacy Antibiotic Note  [name]. is a 84 y.o. male admitted on 12/21/2017 with wound infectionPharmacy has been consulted for vanc/unasyn dosing. Patient received vanc 1g IV x 1 2200.  Plan: Will continue vanc 1g IV q24h w/ 6 hour stack Will draw vanc trough 09/15 @ 0300 prior to 4th dose. Will start Unasyn 3g IV q8h  Ke 0.0336 T1/2 24 hours Goal trough 15 - 20 mcg/mL  Height: 5\' 6"  (167.6 cm) Weight: 184 lb (83.5 kg) IBW/kg (Calculated) : 63.8  Temp (24hrs), Avg:97.9 F (36.6 C), Min:97.9 F (36.6 C), Max:97.9 F (36.6 C)  Recent Labs  Lab 12/21/17 1717  WBC 18.7*  CREATININE 1.58*  LATICACIDVEN 1.8    Estimated Creatinine Clearance: 35.3 mL/min (A) (by C-G formula based on SCr of 1.58 mg/dL (H)).    Allergies  Allergen Reactions  . Contrast Media [Iodinated Diagnostic Agents] Shortness Of Breath  . Morphine And Related Other (See Comments)    Hallucinations   . Niacin And Related Dermatitis  . Other     Other reaction(s): SHORTNESS OF BREATH  . Amlodipine Rash    Thank you for allowing pharmacy to be a part of this patient's care.  Tobie Lords, PharmD, BCPS Clinical Pharmacist 12/22/2017

## 2017-12-22 NOTE — Progress Notes (Signed)
Pharmacist Prescriber Communication  Patient takes apixaban 5 mg po BID for atrial fibrillation PTA which was appropriately reduced to 2.5 mg po BID on admission due to age >74 and SCr > 1.5 mg/dl. Today SCr is less than 1.5 mg/dL so we will resume PTA dosing of apixaban 5 mg po BID.   Dose reductions for DOACs for renal function refer to chronic kidney disease - DOACs should be avoided in acute kidney injury.  Kattia Selley A. [place], [place].D., BCPS Clinical Pharmacist 12/22/2017 08:08

## 2017-12-22 NOTE — Progress Notes (Signed)
Late entering , pt was admitted around after 1 am from ED with left leg and left arm cellulitis. Pt was initially treated as out pt for left leg cellulitis  And pt stated noted his left arm swelling showing signs of  Infection per pt son and PCP sent him to ED. Pt is a/ox3, VSS. Stated left arm and leg hurt's with movement. Noted SOB with exertion. Pt son at bedside. Pt and son oriented to room and call light. Bed to lowest position and call light at reach.

## 2017-12-22 NOTE — Progress Notes (Signed)
The patient complains of leg swelling and left arm swelling. Vital signs and lab reviewed. Discontinued IV fluid.  And resumed home torsemide.  Continue current treatment.  Discussed with the patient, her daughter and RN.  Time spent 26 minutes.

## 2017-12-23 LAB — GLUCOSE, CAPILLARY
GLUCOSE-CAPILLARY: 86 mg/dL (ref 70–99)
GLUCOSE-CAPILLARY: 212 mg/dL — AB (ref 70–99)
Glucose-Capillary: 218 mg/dL — ABNORMAL HIGH (ref 70–99)
Glucose-Capillary: 310 mg/dL — ABNORMAL HIGH (ref 70–99)

## 2017-12-23 LAB — BASIC METABOLIC PANEL
ANION GAP: 14 (ref 5–15)
BUN: 38 mg/dL — ABNORMAL HIGH (ref 8–23)
CALCIUM: 9 mg/dL (ref 8.9–10.3)
CO2: 26 mmol/L (ref 22–32)
Chloride: 95 mmol/L — ABNORMAL LOW (ref 98–111)
Creatinine, Ser: 1.67 mg/dL — ABNORMAL HIGH (ref 0.61–1.24)
GFR, EST AFRICAN AMERICAN: 42 mL/min — AB (ref >60)
GFR, EST NON AFRICAN AMERICAN: 36 mL/min — AB (ref >60)
GLUCOSE: 215 mg/dL — AB (ref 70–99)
Potassium: 3 mmol/L — ABNORMAL LOW (ref 3.5–5.1)
SODIUM: 135 mmol/L (ref 135–145)

## 2017-12-23 LAB — MAGNESIUM: MAGNESIUM: 2.2 mg/dL (ref 1.7–2.4)

## 2017-12-23 MED ORDER — POTASSIUM CHLORIDE CRYS ER 20 MEQ PO TBCR
40.0000 meq | EXTENDED_RELEASE_TABLET | ORAL | Status: AC
  Administered 2017-12-23 (×2): 40 meq via ORAL
  Filled 2017-12-23 (×2): qty 2

## 2017-12-23 MED ORDER — SODIUM CHLORIDE 0.9 % IV SOLN
INTRAVENOUS | Status: DC | PRN
  Administered 2017-12-23 (×2): 20 mL via INTRAVENOUS

## 2017-12-23 MED ORDER — TORSEMIDE 20 MG PO TABS
40.0000 mg | ORAL_TABLET | Freq: Every day | ORAL | Status: DC
Start: 2017-12-24 — End: 2017-12-25
  Administered 2017-12-24: 40 mg via ORAL
  Filled 2017-12-23 (×2): qty 2

## 2017-12-23 NOTE — Progress Notes (Signed)
Pharmacy Antibiotic Note  [name]. is a 84 y.o. male admitted on 12/21/2017 with wound infectionPharmacy has been consulted for vanc/unasyn dosing. Patient received vanc 1g IV x 1 2200.  Plan: Vancomycin 1.25 gm IV Q24H and Unasyn 3 gm IV Q6H day 3 - empirically increased doses yesterday due to improved creatinine clearance. Yesterday hospitalist also resumed torsemide. Continue antibiotics as ordered and will ask to check serum creatinine for monitoring.   Height: 5\' 6"  (167.6 cm) Weight: 180 lb 6.4 oz (81.8 kg) IBW/kg (Calculated) : 63.8  Temp (24hrs), Avg:98.1 F (36.7 C), Min:97.7 F (36.5 C), Max:98.4 F (36.9 C)  Recent Labs  Lab 12/21/17 1717 12/22/17 0623  WBC 18.7* 15.3*  CREATININE 1.58* 1.28*  LATICACIDVEN 1.8  --     Estimated Creatinine Clearance: 43.1 mL/min (A) (by C-G formula based on SCr of 1.28 mg/dL (H)).    Allergies  Allergen Reactions  . Contrast Media [Iodinated Diagnostic Agents] Shortness Of Breath  . Morphine And Related Other (See Comments)    Hallucinations   . Niacin And Related Dermatitis  . Other     Other reaction(s): SHORTNESS OF BREATH  . Amlodipine Rash    Thank you for allowing pharmacy to be a part of this patient's care.  [name], PharmD, BCPS Clinical Pharmacist 12/23/2017

## 2017-12-23 NOTE — Evaluation (Signed)
Occupational Therapy Evaluation Patient Details Name: [name]. MRN: [id number] DOB: 1931/12/17 Today's Date: 12/23/2017    History of Present Illness 84yo male pt presented to ER secondary to L UE swelling, redness and edema; admitted with L UE cellulitis.   Clinical Impression   Pt seen for OT evaluation this date. Prior to hospital admission, pt was modified independent with mobility using 4WW and generally independent with ADL, with occasional assist from spouse for LB dressing. Pt lives in a 1 story home with his spouse and a walk in shower with shower chair. Currently pt demonstrates impairments in LUE strength, ROM, and pain impairing his LUE functional use requiring min assist for UB ADL LB ADL.  >8 minutes spent aside from evaluation on Pt/family instruction in modified techniques for dressing and bathing to maximize safety, independence, and joint/skin protection for LUE. Pt instructed in LUE positioning and therex/ROM to maximize recall and carryover of learned techniques. Pt would benefit from skilled OT following discharge to address noted impairments and functional limitations (see below for any additional details) in order to maximize safety and independence while minimizing falls risk and caregiver burden.  Upon hospital discharge, recommend pt discharge home with OP OT.    Follow Up Recommendations  Outpatient OT    Equipment Recommendations  Other (comment)(reacher, sock aide)    Recommendations for Other Services       Precautions / Restrictions Precautions Precautions: Fall Restrictions Weight Bearing Restrictions: No      Mobility                   Balance                           ADL either performed or assessed with clinical judgement   ADL Overall ADL's : Needs assistance/impaired Eating/Feeding: Sitting;Independent   Grooming: Sitting;Independent   Upper Body Bathing: Sitting;Minimal assistance;With caregiver independent  assisting   Lower Body Bathing: Sit to/from stand;Minimal assistance;With caregiver independent assisting Lower Body Bathing Details (indicate cue type and reason): pt/family educated in use of shower seat for seated shower to minimize falls risk Upper Body Dressing : Sitting;Supervision/safety Upper Body Dressing Details (indicate cue type and reason): pt/family instructed in techniques to improve UB dressing and optimizing skin/joint protection of LUE Lower Body Dressing: Sit to/from stand;Minimal assistance;With caregiver independent assisting Lower Body Dressing Details (indicate cue type and reason): pt/family instructed in AE for LB dressing to improve independence                     Vision Patient Visual Report: No change from baseline       Perception     Praxis      Pertinent Vitals/Pain Pain Assessment: Faces Faces Pain Scale: Hurts a little bit Pain Location: LUE with shoulder ROM Pain Descriptors / Indicators: Aching;Guarding;Grimacing Pain Intervention(s): Limited activity within patient's tolerance;Monitored during session;Repositioned     Hand Dominance Right   Extremity/Trunk Assessment Upper Extremity Assessment Upper Extremity Assessment: LUE deficits/detail(RUE WFL) LUE Deficits / Details: chronic ROM limitations to L shoulder with generalized edema throughout L UE, RA in fingers, grip ~4-/5   Lower Extremity Assessment Lower Extremity Assessment: Defer to PT evaluation;Overall WFL for tasks assessed       Communication Communication Communication: No difficulties   Cognition Arousal/Alertness: Awake/alert Behavior During Therapy: WFL for tasks assessed/performed Overall Cognitive Status: Within Functional Limits for tasks assessed  General Comments       Exercises Other Exercises Other Exercises: Pt/family instructed in LUE positioning and therex/ROM to maximize recall and carryover of  learned information   Shoulder Instructions      Home Living Family/patient expects to be discharged to:: Private residence Living Arrangements: Spouse/significant other(Neice lives with patient/wife to provide assist as needed) Available Help at Discharge: Family;Available 24 hours/day Type of Home: House Home Access: Stairs to enter [employer] of Steps: 2 Entrance Stairs-Rails: Right;Left;Can reach both Home Layout: One level     Bathroom Shower/Tub: Occupational psychologist: Standard     Home Equipment: Environmental consultant - 4 wheels;Shower seat   Additional Comments: Niece provides assistance with household chores, meals and medications/medical needs      Prior Functioning/Environment Level of Independence: Independent with assistive device(s)        Comments: Mod indep with 4WRW for transfers and household, limited community distances; does endorse 3-4 falls within previous six months.        OT Problem List: Impaired UE functional use;Pain;Decreased range of motion;Decreased strength;Decreased knowledge of use of DME or AE      OT Treatment/Interventions:      OT Goals(Current goals can be found in the care plan section) Acute Rehab OT Goals Patient Stated Goal: to return home OT Goal Formulation: All assessment and education complete, DC therapy  OT Frequency:     Barriers to D/C:            Co-evaluation              AM-PAC PT "6 Clicks" Daily Activity     Outcome Measure Help from another person eating meals?: None Help from another person taking care of personal grooming?: None Help from another person toileting, which includes using toliet, bedpan, or urinal?: None Help from another person bathing (including washing, rinsing, drying)?: A Little Help from another person to put on and taking off regular upper body clothing?: A Little Help from another person to put on and taking off regular lower body clothing?: A Little 6 Click Score:  21   End of Session    Activity Tolerance: Patient tolerated treatment well Patient left: in bed;with call bell/phone within reach;with bed alarm set;with family/visitor present  OT Visit Diagnosis: Pain Pain - Right/Left: Left Pain - part of body: Arm                Time: 1341-1357 OT Time Calculation (min): 16 min Charges:  OT General Charges $OT Visit: 1 Visit OT Evaluation $OT Eval Low Complexity: 1 Low OT Treatments $Self Care/Home Management : 8-22 mins  Jeni Salles, MPH, MS, OTR/L ascom [phone] 12/23/17, 2:12 PM

## 2017-12-23 NOTE — Progress Notes (Signed)
[place] at [place] NAME: Laverne Klugh    MR#:  [id number]  DATE OF BIRTH:  1931/08/31  SUBJECTIVE:  CHIEF COMPLAINT:   Chief Complaint  Patient presents with  . Cellulitis   The patient feels better. REVIEW OF SYSTEMS:  Review of Systems  Constitutional: Positive for malaise/fatigue. Negative for chills and fever.  HENT: Negative for sore throat.   Eyes: Negative for blurred vision and double vision.  Respiratory: Negative for cough, hemoptysis, shortness of breath, wheezing and stridor.   Cardiovascular: Negative for chest pain, palpitations, orthopnea and leg swelling.  Gastrointestinal: Negative for abdominal pain, blood in stool, diarrhea, melena, nausea and vomiting.  Genitourinary: Negative for dysuria, flank pain and hematuria.  Musculoskeletal: Negative for back pain and joint pain.       Left arm swelling.  Neurological: Negative for dizziness, sensory change, focal weakness, seizures, loss of consciousness, weakness and headaches.  Endo/Heme/Allergies: Negative for polydipsia.  Psychiatric/Behavioral: Negative for depression. The patient is not nervous/anxious.     DRUG ALLERGIES:   Allergies  Allergen Reactions  . Contrast Media [Iodinated Diagnostic Agents] Shortness Of Breath  . Morphine And Related Other (See Comments)    Hallucinations   . Niacin And Related Dermatitis  . Other     Other reaction(s): SHORTNESS OF BREATH  . Amlodipine Rash   VITALS:  Blood pressure (!) 113/52, pulse 67, temperature 98.3 F (36.8 C), temperature source Oral, resp. rate 19, height 5\' 6"  (1.676 m), weight 81.8 kg, SpO2 97 %. PHYSICAL EXAMINATION:  Physical Exam  Constitutional: He is oriented to person, place, and time.  HENT:  Head: Normocephalic.  Mouth/Throat: Oropharynx is clear and moist.  Eyes: Pupils are equal, round, and reactive to light. Conjunctivae and EOM are normal. No scleral icterus.  Neck: Normal range of  motion. Neck supple. No JVD present. No tracheal deviation present.  Cardiovascular: Normal rate, regular rhythm and normal heart sounds. Exam reveals no gallop.  No murmur heard. Pulmonary/Chest: Effort normal and breath sounds normal. No respiratory distress. He has no wheezes. He has no rales.  Abdominal: Soft. Bowel sounds are normal. He exhibits no distension. There is no tenderness. There is no rebound.  Musculoskeletal: Normal range of motion. He exhibits edema. He exhibits no tenderness.  Better left arm swelling Left leg on dressing.  Neurological: He is alert and oriented to person, place, and time. No cranial nerve deficit.  Skin: No rash noted. No erythema.  Psychiatric: He has a normal mood and affect.   LABORATORY PANEL:  Male CBC Recent Labs  Lab 12/22/17 0623  WBC 15.3*  HGB 13.8  HCT 40.7  PLT 216   ------------------------------------------------------------------------------------------------------------------ Chemistries  Recent Labs  Lab 12/21/17 1717  12/23/17 1206  NA 137   < > 135  K 4.5   < > 3.0*  CL 106   < > 95*  CO2 25   < > 26  GLUCOSE 258*   < > 215*  BUN 35*   < > 38*  CREATININE 1.58*   < > 1.67*  CALCIUM 8.9   < > 9.0  MG  --   --  2.2  AST 13*  --   --   ALT 13  --   --   ALKPHOS 58  --   --   BILITOT 0.6  --   --    < > = values in this interval not displayed.   RADIOLOGY:  No results found. ASSESSMENT AND PLAN:   1.  Acute left upper extremity cellulitis with leukocytosis.   Patient failed treatment with Keflex as outpatient.   Continue IV antibiotics with Unasyn and vancomycin.    2.  Chronic atrial fibrillation, currently rate controlled.   Continue maintenance therapy with beta-blockers.  Continue anticoagulation with Eliquis, dose adjusted to 2.5 mg twice a day, based on patient's age and kidney function.  3.   Acute on CKD 3,  Decrease torsemide to once daily, Continue to monitor kidney function closely and avoid  nephrotoxic medications.  4.  Diabetes type 2 with hyperglycemia.  Will start low-carb diet and monitor blood sugars before meals and at bedtime.  Use insulin treatment during the hospital stay.  5.  Left hemiparesis, status post remote stroke.  6.  Unstable gait and generalized weakness.   PT and OT evaluate and treat the patient.  Hypokalemia.  Give potassium supplement and follow-up level.  All the records are reviewed and case discussed with Care Management/Social Worker. Management plans discussed with the patient, his son and they are in agreement.  CODE STATUS: Full Code  TOTAL TIME TAKING CARE OF THIS PATIENT: 35 minutes.   More than 50% of the time was spent in counseling/coordination of care: YES  POSSIBLE D/C IN 2 DAYS, DEPENDING ON CLINICAL CONDITION.   Demetrios Loll M.D on 12/23/2017 at 2:05 PM  Between 7am to 6pm - Pager - [phone]  After 6pm go to [email or web address] - [facility]

## 2017-12-23 NOTE — Evaluation (Signed)
Physical Therapy Evaluation Patient Details Name: [name]. MRN: [id number] DOB: 09-05-1931 Today's Date: 12/23/2017   History of Present Illness  presented to ER secondary to L UE swelling, redness and edema; admitted with L UE cellulitis.  Clinical Impression  Upon evaluation, patient alert and oriented; follows all commands and demonstrates good insight/safety awareness.  Chronic ROM limitations to L shoulder noted with generalized edema throughout L UE; otherwise, strength and ROM grossly WFL for basic transfers and mobility (mild L hemiparesis appreciated, baseline).  Demonstrates ability to complete bed mobility with mod indep; sit/stand, basic transfers and gait (200') with RW, cga/close sup.  Fair cadence/gait speed (10' walk time, 7-8 seconds), appropriate for household and limited community mobilization.  Mild sway with head turns, but able to self-correct without difficulty.   Patient/son reporting gait and mobility appear comparable to baseline mobility status; no acute PT needs identified at this time.  Do encourage continued mobility with nursing staff to maintain functional strength/endurance throughout remaining hospitalization.     Follow Up Recommendations      Equipment Recommendations  (has necessary equipment)    Recommendations for Other Services       Precautions / Restrictions Precautions Precautions: Fall Restrictions Weight Bearing Restrictions: No      Mobility  Bed Mobility Overal bed mobility: Modified Independent                Transfers Overall transfer level: Needs assistance Equipment used: Rolling walker (2 wheeled) Transfers: Sit to/from Stand Sit to Stand: Min guard;Supervision         General transfer comment: requires use of UE support to complete movement transition  Ambulation/Gait Ambulation/Gait assistance: Supervision;Min guard Gait Distance (Feet): 200 Feet Assistive device: Rolling walker (2 wheeled)   Gait  velocity: 10' walk time, 7-8 seconds   General Gait Details: reciprocal stepping with fair step height/length; bilat LEs with mildly excessive ER.  Slow, but steady, with good safety awareness/insight.  Mild sway with head turns, but able to self correct without difficulty.  Patient/son reporting gait appears comparable to baseline mobility status.  Stairs            Wheelchair Mobility    Modified Rankin (Stroke Patients Only)       Balance Overall balance assessment: Needs assistance Sitting-balance support: No upper extremity supported;Feet supported Sitting balance-Leahy Scale: Good     Standing balance support: Bilateral upper extremity supported Standing balance-Leahy Scale: Fair                               Pertinent Vitals/Pain Pain Assessment: Faces Faces Pain Scale: Hurts little more Pain Location: back Pain Descriptors / Indicators: Aching;Guarding;Grimacing Pain Intervention(s): Limited activity within patient's tolerance;Monitored during session;Repositioned    Home Living Family/patient expects to be discharged to:: Private residence Living Arrangements: Spouse/significant other(Neice lives with patient/wife to provide assist as needed) Available Help at Discharge: Family;Available 24 hours/day Type of Home: House Home Access: Stairs to enter Entrance Stairs-Rails: Right;Left;Can reach both Entrance Stairs-Number of Steps: 2 Home Layout: One level Home Equipment: Walker - 4 wheels Additional Comments: Niece provides assistance with household chores, meals and medications/medical needs    Prior Function Level of Independence: Independent with assistive device(s)         Comments: Mod indep with 4WRW for transfers and household, limited community distances; does endorse 3-4 falls within previous six months.     Hand Dominance  Extremity/Trunk Assessment   Upper Extremity Assessment Upper Extremity Assessment: Overall WFL  for tasks assessed(chronic ROM limitations to L shoulder wtih generalized edema throughout L UE)    Lower Extremity Assessment Lower Extremity Assessment: Overall WFL for tasks assessed(R LE at least 4+/5, L LE 4-/5 (baseline for patient))       Communication   Communication: No difficulties  Cognition Arousal/Alertness: Awake/alert Behavior During Therapy: WFL for tasks assessed/performed Overall Cognitive Status: Within Functional Limits for tasks assessed                                        General Comments      Exercises Other Exercises Other Exercises: Sit/stand and static standing balance for urinal use, cga/close sup; does require UE support for functional reach outside immediate BOS. P Atient with good insight/awareness and use of compensatory strategies as needed. Other Exercises: Educated in L UE positioning and therex/ROM (grasp/release, pronation/supination) for edema management; patient/son voiced understanding and awareness.   Assessment/Plan    PT Assessment [facility] does not need any further PT services  PT Problem List         PT Treatment Interventions DME instruction;Gait training;Functional mobility training;Therapeutic exercise;Therapeutic activities;Patient/family education    PT Goals (Current goals can be found in the Care Plan section)  Acute Rehab PT Goals Patient Stated Goal: to return home PT Goal Formulation: With patient/family Time For Goal Achievement: 01/06/18 Potential to Achieve Goals: Good    Frequency     Barriers to discharge        Co-evaluation               AM-PAC PT "6 Clicks" Daily Activity  Outcome Measure Difficulty turning over in bed (including adjusting bedclothes, sheets and blankets)?: None Difficulty moving from lying on back to sitting on the side of the bed? : None Difficulty sitting down on and standing up from a chair with arms (e.g., wheelchair, bedside commode, etc,.)?: A Little Help  needed moving to and from a bed to chair (including a wheelchair)?: A Little Help needed walking in hospital room?: A Little Help needed climbing 3-5 steps with a railing? : A Little 6 Click Score: 20    End of Session Equipment Utilized During Treatment: Gait belt Activity Tolerance: Patient tolerated treatment well Patient left: in chair;with call bell/phone within reach;with chair alarm set;with family/visitor present Nurse Communication: Mobility status PT Visit Diagnosis: Muscle weakness (generalized) (M62.81);Difficulty in walking, not elsewhere classified (R26.2)    Time: 2094-7096 PT Time Calculation (min) (ACUTE ONLY): 25 min   Charges:   PT Evaluation $PT Eval Low Complexity: 1 Low PT Treatments $Therapeutic Activity: 8-22 mins       Miklos Bidinger H. Owens Shark, PT, DPT, NCS 12/23/17, 10:59 AM [phone]

## 2017-12-23 NOTE — Progress Notes (Signed)
Late entering. Admission profile is done on paper due to long time downtime computer updating. See paper on the chart

## 2017-12-23 NOTE — Progress Notes (Signed)
OT Cancellation Note  Patient Details Name: [name]. MRN: [id number] DOB: 24-Sep-1931   Cancelled Treatment:    Reason Eval/Treat Not Completed: Other (comment). Order received, chart reviewed. On 1st attempt, pt with nurse. On 2nd attempt, family outside room stating pt was using the bathroom. Will re-attempt at later date/time as pt is available and medically appropriate.   Jeni Salles, MPH, MS, OTR/L ascom [phone] 12/23/17, 1:19 PM

## 2017-12-24 LAB — CBC
HCT: 40.2 % (ref 40.0–52.0)
Hemoglobin: 14 g/dL (ref 13.0–18.0)
MCH: 29.7 pg (ref 26.0–34.0)
MCHC: 34.9 g/dL (ref 32.0–36.0)
MCV: 84.9 fL (ref 80.0–100.0)
Platelets: 223 10*3/uL (ref 150–440)
RBC: 4.73 MIL/uL (ref 4.40–5.90)
RDW: 16.8 % — AB (ref 11.5–14.5)
WBC: 12.2 10*3/uL — ABNORMAL HIGH (ref 3.8–10.6)

## 2017-12-24 LAB — GLUCOSE, CAPILLARY
GLUCOSE-CAPILLARY: 76 mg/dL (ref 70–99)
GLUCOSE-CAPILLARY: 281 mg/dL — AB (ref 70–99)
GLUCOSE-CAPILLARY: 342 mg/dL — AB (ref 70–99)
Glucose-Capillary: 204 mg/dL — ABNORMAL HIGH (ref 70–99)

## 2017-12-24 LAB — BASIC METABOLIC PANEL
Anion gap: 8 (ref 5–15)
BUN: 44 mg/dL — AB (ref 8–23)
CALCIUM: 8.3 mg/dL — AB (ref 8.9–10.3)
CO2: 29 mmol/L (ref 22–32)
CREATININE: 1.59 mg/dL — AB (ref 0.61–1.24)
Chloride: 98 mmol/L (ref 98–111)
GFR calc Af Amer: 44 mL/min — ABNORMAL LOW (ref >60)
GFR calc non Af Amer: 38 mL/min — ABNORMAL LOW (ref >60)
Glucose, Bld: 148 mg/dL — ABNORMAL HIGH (ref 70–99)
Potassium: 2.2 mmol/L — CL (ref 3.5–5.1)
SODIUM: 135 mmol/L (ref 135–145)

## 2017-12-24 LAB — MAGNESIUM: Magnesium: 2.1 mg/dL (ref 1.7–2.4)

## 2017-12-24 LAB — POTASSIUM: Potassium: 2.6 mmol/L — CL (ref 3.5–5.1)

## 2017-12-24 MED ORDER — POTASSIUM CHLORIDE 20 MEQ PO PACK
40.0000 meq | PACK | Freq: Three times a day (TID) | ORAL | Status: AC
  Administered 2017-12-24 (×3): 40 meq via ORAL
  Filled 2017-12-24 (×3): qty 2

## 2017-12-24 MED ORDER — SENNOSIDES-DOCUSATE SODIUM 8.6-50 MG PO TABS
2.0000 | ORAL_TABLET | Freq: Two times a day (BID) | ORAL | Status: AC
  Administered 2017-12-24 – 2017-12-25 (×3): 2 via ORAL
  Filled 2017-12-24 (×3): qty 2

## 2017-12-24 MED ORDER — POTASSIUM CHLORIDE CRYS ER 20 MEQ PO TBCR
60.0000 meq | EXTENDED_RELEASE_TABLET | Freq: Two times a day (BID) | ORAL | Status: AC
  Administered 2017-12-24: 60 meq via ORAL
  Filled 2017-12-24: qty 3

## 2017-12-24 MED ORDER — POTASSIUM CHLORIDE 10 MEQ/100ML IV SOLN
10.0000 meq | INTRAVENOUS | Status: AC
  Administered 2017-12-24 (×4): 10 meq via INTRAVENOUS
  Filled 2017-12-24 (×4): qty 100

## 2017-12-24 MED ORDER — POTASSIUM CHLORIDE CRYS ER 20 MEQ PO TBCR: 40.0000 meq | EXTENDED_RELEASE_TABLET | Freq: Once | ORAL | Status: DC

## 2017-12-24 MED ORDER — POTASSIUM CHLORIDE CRYS ER 20 MEQ PO TBCR
40.0000 meq | EXTENDED_RELEASE_TABLET | Freq: Two times a day (BID) | ORAL | Status: DC
  Administered 2017-12-24: 40 meq via ORAL
  Filled 2017-12-24: qty 2

## 2017-12-24 NOTE — Progress Notes (Signed)
[place] at [place] NAME: [name]    MR#:  [id number]  DATE OF BIRTH:  17-Nov-1931  SUBJECTIVE:  CHIEF COMPLAINT:   Chief Complaint  Patient presents with  . Cellulitis  Complains of constipation, continued left arm swelling/redness, no events overnight  REVIEW OF SYSTEMS:  CONSTITUTIONAL: No fever, fatigue or weakness.  EYES: No blurred or double vision.  EARS, NOSE, AND THROAT: No tinnitus or ear pain.  RESPIRATORY: No cough, shortness of breath, wheezing or hemoptysis.  CARDIOVASCULAR: No chest pain, orthopnea, edema.  GASTROINTESTINAL: No nausea, vomiting, diarrhea or abdominal pain.  GENITOURINARY: No dysuria, hematuria.  ENDOCRINE: No polyuria, nocturia,  HEMATOLOGY: No anemia, easy bruising or bleeding SKIN: No rash or lesion. MUSCULOSKELETAL: No joint pain or arthritis.   NEUROLOGIC: No tingling, numbness, weakness.  PSYCHIATRY: No anxiety or depression.   ROS  DRUG ALLERGIES:   Allergies  Allergen Reactions  . Contrast Media [Iodinated Diagnostic Agents] Shortness Of Breath  . Morphine And Related Other (See Comments)    Hallucinations   . Niacin And Related Dermatitis  . Other     Other reaction(s): SHORTNESS OF BREATH  . Amlodipine Rash    VITALS:  Blood pressure 126/74, pulse 85, temperature 97.8 F (36.6 C), temperature source Oral, resp. rate 18, height 5\' 6"  (1.676 m), weight 81.8 kg, SpO2 98 %.  PHYSICAL EXAMINATION:  GENERAL:  84 y.o.-year-old patient lying in the bed with no acute distress.  EYES: Pupils equal, round, reactive to light and accommodation. No scleral icterus. Extraocular muscles intact.  HEENT: Head atraumatic, normocephalic. Oropharynx and nasopharynx clear.  NECK:  Supple, no jugular venous distention. No thyroid enlargement, no tenderness.  LUNGS: Normal breath sounds bilaterally, no wheezing, rales,rhonchi or crepitation. No use of accessory muscles of respiration.   CARDIOVASCULAR: S1, S2 normal. No murmurs, rubs, or gallops.  ABDOMEN: Soft, nontender, nondistended. Bowel sounds present. No organomegaly or mass.  EXTREMITIES: No pedal edema, cyanosis, or clubbing.  NEUROLOGIC: Cranial nerves II through XII are intact. Muscle strength 5/5 in all extremities. Sensation intact. Gait not checked.  PSYCHIATRIC: The patient is alert and oriented x 3.  SKIN: No obvious rash, lesion, or ulcer.   Physical Exam LABORATORY PANEL:   CBC Recent Labs  Lab 12/24/17 0330  WBC 12.2*  HGB 14.0  HCT 40.2  PLT 223   ------------------------------------------------------------------------------------------------------------------  Chemistries  Recent Labs  Lab 12/21/17 1717  12/24/17 0330  NA 137   < > 135  K 4.5   < > 2.2*  CL 106   < > 98  CO2 25   < > 29  GLUCOSE 258*   < > 148*  BUN 35*   < > 44*  CREATININE 1.58*   < > 1.59*  CALCIUM 8.9   < > 8.3*  MG  --    < > 2.1  AST 13*  --   --   ALT 13  --   --   ALKPHOS 58  --   --   BILITOT 0.6  --   --    < > = values in this interval not displayed.   ------------------------------------------------------------------------------------------------------------------  Cardiac Enzymes No results for input(s): TROPONINI in the last 168 hours. ------------------------------------------------------------------------------------------------------------------  RADIOLOGY:  No results found.  ASSESSMENT AND PLAN:  *Acute left upper extremity cellulitis with leukocytosis Stable Patient failed outpatient management w/ Keflex  Continue empiric IV Unasyn and vancomycin, follow-up on cultures  *Acute severe hypokalemia Replete  with IV/p.o. potassium, magnesium level was normal, repeat potassium level at 11am, BMP in the morning  *Chronic atrial fibrillation Stable Continue BB, Eliquis -dose adjusted to 2.5 mg twice a daybased on patient's age and kidney function.  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 Management plans discussed with the patient, family and they are in agreement.  CODE STATUS: 40  TOTAL TIME TAKING CARE OF THIS PATIENT: 45 minutes.     POSSIBLE D/C IN 1-2 DAYS, DEPENDING ON CLINICAL CONDITION.   Avel Peace [name] M.D on 12/24/2017   Between 7am to 6pm - Pager - [phone]  After 6pm go to [email or web address] - password [email or web address]  Office  [phone]  CC: Primary care physician; Jearld Fenton, NP  Note: This dictation was prepared with Dragon dictation along with smaller phrase technology. Any transcriptional errors that result from this process are unintentional.

## 2017-12-24 NOTE — Plan of Care (Signed)
Dr. Text to inform of critical potassium (2.6)

## 2017-12-24 NOTE — Progress Notes (Signed)
Family Meeting Note  Advance Directive:yes  Today a meeting took place with the Patient.  Patient is able to participate   The following clinical team members were present during this meeting:MD  The following were discussed:Patient's diagnosis: CVA with hemiparesis/ataxia, morbid obesity, cellulitis, leg wound, A. fib, diabetes, Patient's progosis: Unable to determine and Goals for treatment: Full Code  Additional follow-up to be provided: prn  Time spent during discussion:20 minutes  Gorden Harms, MD

## 2017-12-25 ENCOUNTER — Inpatient Hospital Stay: Payer: Medicare Other

## 2017-12-25 LAB — CBC
HCT: 39.3 % — ABNORMAL LOW (ref 40.0–52.0)
Hemoglobin: 13.6 g/dL (ref 13.0–18.0)
MCH: 29.7 pg (ref 26.0–34.0)
MCHC: 34.6 g/dL (ref 32.0–36.0)
MCV: 86.1 fL (ref 80.0–100.0)
PLATELETS: 217 10*3/uL (ref 150–440)
RBC: 4.57 MIL/uL (ref 4.40–5.90)
RDW: 17.4 % — ABNORMAL HIGH (ref 11.5–14.5)
WBC: 14.7 10*3/uL — AB (ref 3.8–10.6)

## 2017-12-25 LAB — GLUCOSE, CAPILLARY
GLUCOSE-CAPILLARY: 288 mg/dL — AB (ref 70–99)
Glucose-Capillary: 61 mg/dL — ABNORMAL LOW (ref 70–99)
Glucose-Capillary: 89 mg/dL (ref 70–99)
Glucose-Capillary: 258 mg/dL — ABNORMAL HIGH (ref 70–99)
Glucose-Capillary: 342 mg/dL — ABNORMAL HIGH (ref 70–99)

## 2017-12-25 LAB — BASIC METABOLIC PANEL
Anion gap: 8 (ref 5–15)
BUN: 40 mg/dL — ABNORMAL HIGH (ref 8–23)
CO2: 29 mmol/L (ref 22–32)
Calcium: 8.5 mg/dL — ABNORMAL LOW (ref 8.9–10.3)
Chloride: 104 mmol/L (ref 98–111)
Creatinine, Ser: 1.59 mg/dL — ABNORMAL HIGH (ref 0.61–1.24)
GFR calc Af Amer: 44 mL/min — ABNORMAL LOW (ref >60)
GFR, EST NON AFRICAN AMERICAN: 38 mL/min — AB (ref >60)
GLUCOSE: 99 mg/dL (ref 70–99)
POTASSIUM: 2.6 mmol/L — AB (ref 3.5–5.1)
Sodium: 141 mmol/L (ref 135–145)

## 2017-12-25 LAB — VANCOMYCIN, TROUGH: Vancomycin Tr: 17 ug/mL (ref 15–20)

## 2017-12-25 LAB — POTASSIUM: Potassium: 3.3 mmol/L — ABNORMAL LOW (ref 3.5–5.1)

## 2017-12-25 LAB — MAGNESIUM: Magnesium: 2.2 mg/dL (ref 1.7–2.4)

## 2017-12-25 MED ORDER — ACETAMINOPHEN 500 MG PO TABS
500.0000 mg | ORAL_TABLET | Freq: Three times a day (TID) | ORAL | Status: DC
  Administered 2017-12-25 – 2017-12-26 (×4): 500 mg via ORAL
  Filled 2017-12-25 (×4): qty 1

## 2017-12-25 MED ORDER — TRAMADOL HCL 50 MG PO TABS
50.0000 mg | ORAL_TABLET | Freq: Four times a day (QID) | ORAL | Status: DC | PRN
  Administered 2017-12-25: 50 mg via ORAL
  Filled 2017-12-25: qty 1

## 2017-12-25 MED ORDER — INSULIN DETEMIR 100 UNIT/ML ~~LOC~~ SOLN
32.0000 [IU] | Freq: Every day | SUBCUTANEOUS | Status: DC
  Administered 2017-12-25: 32 [IU] via SUBCUTANEOUS
  Filled 2017-12-25 (×3): qty 0.32

## 2017-12-25 MED ORDER — MELOXICAM 7.5 MG PO TABS
15.0000 mg | ORAL_TABLET | Freq: Every day | ORAL | Status: DC
  Administered 2017-12-25 – 2017-12-26 (×2): 15 mg via ORAL
  Filled 2017-12-25 (×2): qty 2

## 2017-12-25 MED ORDER — POTASSIUM CHLORIDE CRYS ER 20 MEQ PO TBCR
40.0000 meq | EXTENDED_RELEASE_TABLET | Freq: Three times a day (TID) | ORAL | Status: DC
  Administered 2017-12-25 (×2): 40 meq via ORAL
  Filled 2017-12-25 (×2): qty 2

## 2017-12-25 MED ORDER — POTASSIUM CHLORIDE CRYS ER 20 MEQ PO TBCR: 40.0000 meq | EXTENDED_RELEASE_TABLET | Freq: Two times a day (BID) | ORAL | Status: DC

## 2017-12-25 MED ORDER — SPIRONOLACTONE 25 MG PO TABS
25.0000 mg | ORAL_TABLET | Freq: Every day | ORAL | Status: DC
  Administered 2017-12-25 – 2017-12-26 (×2): 25 mg via ORAL
  Filled 2017-12-25 (×2): qty 1

## 2017-12-25 MED ORDER — POTASSIUM CHLORIDE CRYS ER 20 MEQ PO TBCR
40.0000 meq | EXTENDED_RELEASE_TABLET | Freq: Three times a day (TID) | ORAL | Status: DC
  Administered 2017-12-25: 40 meq via ORAL
  Filled 2017-12-25: qty 2

## 2017-12-25 MED ORDER — GABAPENTIN 100 MG PO CAPS
200.0000 mg | ORAL_CAPSULE | Freq: Three times a day (TID) | ORAL | Status: DC
  Administered 2017-12-25 – 2017-12-26 (×4): 200 mg via ORAL
  Filled 2017-12-25 (×4): qty 2

## 2017-12-25 NOTE — Progress Notes (Signed)
Pharmacy Antibiotic Note  [name]. is a 84 y.o. male admitted on 12/21/2017 with wound infectionPharmacy has been consulted for vanc/unasyn dosing. Patient received vanc 1g IV x 1 2200.  Plan: Vancomycin level resulted @ 17. Will continue regimen. . Will continue Unasyn 3g IV q8h  Ke 0.0336 T1/2 24 hours Goal trough 15 - 20 mcg/mL  Height: 5\' 6"  (167.6 cm) Weight: 180 lb 4.8 oz (81.8 kg) IBW/kg (Calculated) : 63.8  Temp (24hrs), Avg:98 F (36.7 C), Min:97.4 F (36.3 C), Max:98.6 F (37 C)  Recent Labs  Lab 12/21/17 1717 12/22/17 0623 12/23/17 1206 12/24/17 0330 12/25/17 0418 12/25/17 0917  WBC 18.7* 15.3*  --  12.2* 14.7*  --   CREATININE 1.58* 1.28* 1.67* 1.59* 1.59*  --   LATICACIDVEN 1.8  --   --   --   --   --   VANCOTROUGH  --   --   --   --   --  17    Estimated Creatinine Clearance: 34.7 mL/min (A) (by C-G formula based on SCr of 1.59 mg/dL (H)).    Allergies  Allergen Reactions  . Contrast Media [Iodinated Diagnostic Agents] Shortness Of Breath  . Morphine And Related Other (See Comments)    Hallucinations   . Niacin And Related Dermatitis  . Other     Other reaction(s): SHORTNESS OF BREATH  . Amlodipine Rash    Thank you for allowing pharmacy to be a part of this patient's care.  Larene Beach, PharmD  Clinical Pharmacist 12/25/2017

## 2017-12-25 NOTE — Progress Notes (Signed)
Patient K+ 2.6.macular degeneration notified. Orders pending.

## 2017-12-25 NOTE — Progress Notes (Signed)
[facility] [place] NAME: [name]    MR#:  [id number]  DATE OF BIRTH:  1931/08/06  SUBJECTIVE:  CHIEF COMPLAINT:   Chief Complaint  Patient presents with  . Cellulitis  Blood sugar again low this morning-61 per patient, will reduce long-acting insulin, potassium remains critically low, discontinue torsemide, start spironolactone  REVIEW OF SYSTEMS:  CONSTITUTIONAL: No fever, fatigue or weakness.  EYES: No blurred or double vision.  EARS, NOSE, AND THROAT: No tinnitus or ear pain.  RESPIRATORY: No cough, shortness of breath, wheezing or hemoptysis.  CARDIOVASCULAR: No chest pain, orthopnea, edema.  GASTROINTESTINAL: No nausea, vomiting, diarrhea or abdominal pain.  GENITOURINARY: No dysuria, hematuria.  ENDOCRINE: No polyuria, nocturia,  HEMATOLOGY: No anemia, easy bruising or bleeding SKIN: No rash or lesion. MUSCULOSKELETAL: No joint pain or arthritis.   NEUROLOGIC: No tingling, numbness, weakness.  PSYCHIATRY: No anxiety or depression.   ROS  DRUG ALLERGIES:   Allergies  Allergen Reactions  . Contrast Media [Iodinated Diagnostic Agents] Shortness Of Breath  . Morphine And Related Other (See Comments)    Hallucinations   . Niacin And Related Dermatitis  . Other     Other reaction(s): SHORTNESS OF BREATH  . Amlodipine Rash    VITALS:  Blood pressure 108/65, pulse 84, temperature (!) 97.4 F (36.3 C), temperature source Oral, resp. rate 18, height 5\' 6"  (1.676 m), weight 81.8 kg, SpO2 96 %.  PHYSICAL EXAMINATION:  GENERAL:  84 y.o.-year-old patient lying in the bed with no acute distress.  EYES: Pupils equal, round, reactive to light and accommodation. No scleral icterus. Extraocular muscles intact.  HEENT: Head atraumatic, normocephalic. Oropharynx and nasopharynx clear.  NECK:  Supple, no jugular venous distention. No thyroid enlargement, no tenderness.  LUNGS: Normal breath sounds bilaterally, no wheezing,  rales,rhonchi or crepitation. No use of accessory muscles of respiration.  CARDIOVASCULAR: S1, S2 normal. No murmurs, rubs, or gallops.  ABDOMEN: Soft, nontender, nondistended. Bowel sounds present. No organomegaly or mass.  EXTREMITIES: No pedal edema, cyanosis, or clubbing.  NEUROLOGIC: Cranial nerves II through XII are intact. Muscle strength 5/5 in all extremities. Sensation intact. Gait not checked.  PSYCHIATRIC: The patient is alert and oriented x 3.  SKIN: No obvious rash, lesion, or ulcer.   Physical Exam LABORATORY PANEL:   CBC Recent Labs  Lab 12/25/17 0418  WBC 14.7*  HGB 13.6  HCT 39.3*  PLT 217   ------------------------------------------------------------------------------------------------------------------  Chemistries  Recent Labs  Lab 12/21/17 1717  12/25/17 0418  NA 137   < > 141  K 4.5   < > 2.6*  CL 106   < > 104  CO2 25   < > 29  GLUCOSE 258*   < > 99  BUN 35*   < > 40*  CREATININE 1.58*   < > 1.59*  CALCIUM 8.9   < > 8.5*  MG  --    < > 2.2  AST 13*  --   --   ALT 13  --   --   ALKPHOS 58  --   --   BILITOT 0.6  --   --    < > = values in this interval not displayed.   ------------------------------------------------------------------------------------------------------------------  Cardiac Enzymes No results for input(s): TROPONINI in the last 168 hours. ------------------------------------------------------------------------------------------------------------------  RADIOLOGY:  No results found.  ASSESSMENT AND PLAN:  *Acute left upper extremity cellulitiswith leukocytosis Resolving Patient failed outpatient management w/ Keflex  Continue empiric IV Unasyn/vancomycin,  follow-up on cultures  *Acute severe hypokalemia Replete with p.o. potassium, start spironolactone, magnesium level was normal, repeat potassium level at noon, BMP in the morning  *Chronic atrial fibrillation Stable Continue BB, Eliquis -dose adjusted to 2.5 mg  twice a daybased on patient's age and kidney function.  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 Management plans discussed with the patient, family and they are in agreement.  CODE STATUS: full  TOTAL TIME TAKING CARE OF THIS PATIENT: 40 minutes.     POSSIBLE D/C IN 2-3 DAYS, DEPENDING ON CLINICAL CONDITION.   Avel Peace Salary M.D on 12/25/2017   Between 7am to 6pm - Pager - [phone]  After 6pm go to [email or web address] - password [email or web address] [facility]  Office  [phone]  CC: Primary care physician; Jearld Fenton, NP  Note: This dictation was prepared with Dragon dictation along with smaller phrase technology. Any transcriptional errors that result from this process are unintentional.

## 2017-12-26 ENCOUNTER — Ambulatory Visit: Payer: Medicare Other | Admitting: Physician Assistant

## 2017-12-26 DIAGNOSIS — Z885 Allergy status to narcotic agent status: Secondary | ICD-10-CM

## 2017-12-26 DIAGNOSIS — Z888 Allergy status to other drugs, medicaments and biological substances status: Secondary | ICD-10-CM

## 2017-12-26 DIAGNOSIS — Z91041 Radiographic dye allergy status: Secondary | ICD-10-CM

## 2017-12-26 DIAGNOSIS — Z7952 Long term (current) use of systemic steroids: Secondary | ICD-10-CM

## 2017-12-26 DIAGNOSIS — Z87891 Personal history of nicotine dependence: Secondary | ICD-10-CM

## 2017-12-26 DIAGNOSIS — L84 Corns and callosities: Secondary | ICD-10-CM

## 2017-12-26 DIAGNOSIS — J449 Chronic obstructive pulmonary disease, unspecified: Secondary | ICD-10-CM

## 2017-12-26 DIAGNOSIS — I509 Heart failure, unspecified: Secondary | ICD-10-CM

## 2017-12-26 DIAGNOSIS — M25612 Stiffness of left shoulder, not elsewhere classified: Secondary | ICD-10-CM

## 2017-12-26 DIAGNOSIS — Z9981 Dependence on supplemental oxygen: Secondary | ICD-10-CM

## 2017-12-26 DIAGNOSIS — L03114 Cellulitis of left upper limb: Secondary | ICD-10-CM

## 2017-12-26 DIAGNOSIS — Z923 Personal history of irradiation: Secondary | ICD-10-CM

## 2017-12-26 DIAGNOSIS — C3432 Malignant neoplasm of lower lobe, left bronchus or lung: Secondary | ICD-10-CM

## 2017-12-26 LAB — BASIC METABOLIC PANEL
ANION GAP: 8 (ref 5–15)
BUN: 39 mg/dL — AB (ref 8–23)
CALCIUM: 8.4 mg/dL — AB (ref 8.9–10.3)
CO2: 27 mmol/L (ref 22–32)
Chloride: 103 mmol/L (ref 98–111)
Creatinine, Ser: 1.6 mg/dL — ABNORMAL HIGH (ref 0.61–1.24)
GFR calc Af Amer: 44 mL/min — ABNORMAL LOW (ref >60)
GFR calc non Af Amer: 38 mL/min — ABNORMAL LOW (ref >60)
GLUCOSE: 197 mg/dL — AB (ref 70–99)
Potassium: 3.3 mmol/L — ABNORMAL LOW (ref 3.5–5.1)
Sodium: 138 mmol/L (ref 135–145)

## 2017-12-26 LAB — GLUCOSE, CAPILLARY
GLUCOSE-CAPILLARY: 162 mg/dL — AB (ref 70–99)
Glucose-Capillary: 164 mg/dL — ABNORMAL HIGH (ref 70–99)

## 2017-12-26 MED ORDER — CEFDINIR 300 MG PO CAPS: 300.0000 mg | ORAL_CAPSULE | Freq: Two times a day (BID) | ORAL | Status: DC

## 2017-12-26 MED ORDER — CEFDINIR 300 MG PO CAPS
300.0000 mg | ORAL_CAPSULE | Freq: Two times a day (BID) | ORAL | Status: DC
  Administered 2017-12-26: 300 mg via ORAL
  Filled 2017-12-26 (×2): qty 1

## 2017-12-26 MED ORDER — SPIRONOLACTONE 25 MG PO TABS: 25.0000 mg | ORAL_TABLET | Freq: Every day | ORAL | 0 refills | Status: DC

## 2017-12-26 MED ORDER — CEFDINIR 300 MG PO CAPS: 300.0000 mg | ORAL_CAPSULE | Freq: Two times a day (BID) | ORAL | 0 refills | Status: DC

## 2017-12-26 MED ORDER — APIXABAN 2.5 MG PO TABS: 2.5000 mg | ORAL_TABLET | Freq: Two times a day (BID) | ORAL | Status: DC

## 2017-12-26 MED ORDER — APIXABAN 2.5 MG PO TABS: 2.5000 mg | ORAL_TABLET | Freq: Two times a day (BID) | ORAL | 0 refills | Status: DC

## 2017-12-26 MED ORDER — POTASSIUM CHLORIDE CRYS ER 20 MEQ PO TBCR
40.0000 meq | EXTENDED_RELEASE_TABLET | Freq: Every day | ORAL | Status: DC
  Administered 2017-12-26: 40 meq via ORAL
  Filled 2017-12-26: qty 2

## 2017-12-26 NOTE — Progress Notes (Signed)
Id Full consult not to follow. Pt admitted with swelling of left arm  COPD/Left lower lobe adenoca s/p radiation  Resolving cellulitis with ? Lymphedema  Left axillary nodes palpable  Need compression sleeve for the arm Cefdinir 300mg  PO BID for 3 days

## 2017-12-26 NOTE — Consult Note (Signed)
Date of Admission:  12/21/2017                 Reason for Consult:  Left arm cellulitis   Referring Provider: Salary    HPI: [name]. is a 84 y.o. 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  Patient states he gets cellulitis versus frequently and a week ago he noted swelling of his left arm and with his PCP who prescribed him Keflex he took it for a few days but no response and then he was asked to go to the emergency department and he got admitted to hospital on 12/22/2017.  He did not have any fever but complained of chills. In the ED his temperature was 97.9, blood pressure 130/78 and heart rate of 79.  His left Aveline he had a Doppler of the left arm to rule out DVT.  Had a white count of 18%.  He was started on vancomycin and Unasyn.  I am asked to see him for further antibiotic recommendation. States he is feeling much better.  The swelling is much improved he states he leans on his left elbow while watching television and he has a callus in that area.  His skin is very friable due to chronic steroid .  He states few months ago he fell and tore the skin on the left arm to a great extent and he came to the ED and it was Steri-Stripped . He lives with his wife at home.  Past medical history CAD Chronic A. fib Congestive heart failure CVA Diabetes mellitus COPD Left lung adenocarcinoma Hypertension Hyperlipidemia Poor mobility of the left shoulder  Past surgical history Cardiac catheterization angioplasty Stent placement Cataract removal Cystoscopy with stent placement Tonsillectomy and adenoidectomy Lithotripsy    Social History   Tobacco Use  . Smoking status: Former Smoker    Packs/day: 2.00    Years: 40.00    Pack years: 80.00    Types: Cigarettes    Last attempt to quit: 05/24/1990    Years since  quitting: 27.6  . Smokeless tobacco: Former Systems developer    Quit date: 05/24/1990  Substance Use Topics  . Alcohol use: No  . Drug use: No    Family History  Problem Relation Age of Onset  . Heart disease Mother   . Heart disease Maternal Grandmother   . Diabetes Maternal Grandmother   . Cancer Neg Hx   . Stroke Neg Hx      . acetaminophen  500 mg Oral TID  . apixaban  5 mg Oral BID  . atorvastatin  40 mg Oral q1800  . citalopram  10 mg Oral Daily  . clotrimazole  1 application Topical BID  . docusate sodium  100 mg Oral BID  . dorzolamide  1 drop Both Eyes TID  . ezetimibe  10 mg Oral Daily  . finasteride  5 mg Oral Daily  . fluticasone furoate-vilanterol  1 puff Inhalation Daily  . gabapentin  200 mg Oral TID  . insulin aspart  0-15 Units Subcutaneous TID WC  . insulin aspart  0-5 Units Subcutaneous QHS  . insulin detemir  32 Units Subcutaneous QHS  . latanoprost  1 drop Both Eyes QHS  . levothyroxine  50 mcg Oral QAC breakfast  . meloxicam  15 mg Oral Daily  . metoprolol  succinate  50 mg Oral BID  . potassium chloride  40 mEq Oral Daily  . predniSONE  10 mg Oral Q breakfast  . spironolactone  25 mg Oral Daily  . tamsulosin  0.4 mg Oral Daily  . tiotropium  1 capsule Inhalation Daily      Abtx:  Anti-infectives (From admission, onward)   Start     Dose/Rate Route Frequency Ordered Stop   12/22/17 1400  Ampicillin-Sulbactam (UNASYN) 3 g in sodium chloride 0.9 % 100 mL IVPB     3 g 200 mL/hr over 30 Minutes Intravenous Every 6 hours 12/22/17 0814     12/22/17 0900  vancomycin (VANCOCIN) 1,250 mg in sodium chloride 0.9 % 250 mL IVPB     1,250 mg 166.7 mL/hr over 90 Minutes Intravenous Every 24 hours 12/22/17 0800     12/22/17 0400  vancomycin (VANCOCIN) IVPB 1000 mg/200 mL premix  Status:  Discontinued     1,000 mg 200 mL/hr over 60 Minutes Intravenous Every 24 hours 12/22/17 0020 12/22/17 0800   12/22/17 0030  Ampicillin-Sulbactam (UNASYN) 3 g in sodium chloride 0.9 %  100 mL IVPB  Status:  Discontinued     3 g 200 mL/hr over 30 Minutes Intravenous Every 8 hours 12/22/17 0020 12/22/17 0814   12/21/17 2130  vancomycin (VANCOCIN) IVPB 1000 mg/200 mL premix     1,000 mg 200 mL/hr over 60 Minutes Intravenous  Once 12/21/17 2125 12/22/17 0019       Review of Systems: Constitutional: Subjective fevers, fatigue and weakness No weight loss Eyes: No blurring of vision HEENT: No sore throat or runny congestion Cardiovascular: No chest pain or palpitations or dizziness Respiratory: Shortness of breath or wheezing occasional cough GI: No nausea or vomiting has constipation Genitourinary: No dysuria or hematuria: Skin: Easy bruising and tearing  Musculoskeletal: Chronic difficulty in abducting the left shoulder, swelling of the upper extremity which is new Psychiatry: No anxiety or depression  Allergies  Allergen Reactions  . Contrast Media [Iodinated Diagnostic Agents] Shortness Of Breath  . Morphine And Related Other (See Comments)    Hallucinations   . Niacin And Related Dermatitis  . Other     Other reaction(s): SHORTNESS OF BREATH  . Amlodipine Rash    OBJECTIVE: Blood pressure 128/86, pulse 74, temperature (!) 97.5 F (36.4 C), temperature source Oral, resp. rate 18, height 5\' 6"  (1.676 m), weight 87.6 kg, SpO2 97 %.  Physical Exam Constitutional: Awake and alert and in no distress Eyes: Pupils equal and reacting to light ENT: No oral thrush CVS: S1-S2: No murmur RS: Reduced air actually, crepts  left base GI; soft, no tenderness or mass GU: Not examined MSK: Bilateral extremity have scarring, skin very friable, bony edema of the left from mid upper arm to mid lower, no erythema.     Ecchymosis of the upper arm.  Difficulty abducting the  left shoulder Lower extremities- very thin skin SKIN: Very friable thin with multiple scars Neurologic: Grossly nonfocal Psychiatric: Mood stable Lymphatic: Questional left axillary lymph nodes  felt.  Possible lipoma  Lab Results CBC    Component Value Date/Time   WBC 14.7 (H) 12/25/2017 0418   RBC 4.57 12/25/2017 0418   HGB 13.6 12/25/2017 0418   HGB 13.3 08/05/2014 1839   HCT 39.3 (L) 12/25/2017 0418   HCT 40.1 08/05/2014 1839   PLT 217 12/25/2017 0418   PLT 246 08/05/2014 1839   MCV 86.1 12/25/2017 0418   MCV 83 08/05/2014 1839  MCH 29.7 12/25/2017 0418   MCHC 34.6 12/25/2017 0418   RDW 17.4 (H) 12/25/2017 0418   RDW 16.3 (H) 08/05/2014 1839   LYMPHSABS 1.6 12/21/2017 1717   LYMPHSABS 2.3 08/05/2014 1839   MONOABS 1.6 (H) 12/21/2017 1717   MONOABS 2.0 (H) 08/05/2014 1839   EOSABS 0.0 12/21/2017 1717   EOSABS 0.1 08/05/2014 1839   BASOSABS 0.1 12/21/2017 1717   BASOSABS 0.1 08/05/2014 1839    CMP Latest Ref Rng & Units 12/26/2017 12/25/2017 12/25/2017  Glucose 70 - 99 mg/dL 197(H) - 99  BUN 8 - 23 mg/dL 39(H) - 40(H)  Creatinine 0.61 - 1.24 mg/dL 1.60(H) - 1.59(H)  Sodium 135 - 145 mmol/L 138 - 141  Potassium 3.5 - 5.1 mmol/L 3.3(L) 3.3(L) 2.6(LL)  Chloride 98 - 111 mmol/L 103 - 104  CO2 22 - 32 mmol/L 27 - 29  Calcium 8.9 - 10.3 mg/dL 8.4(L) - 8.5(L)  Total Protein 6.5 - 8.1 g/dL - - -  Total Bilirubin 0.3 - 1.2 mg/dL - - -  Alkaline Phos 38 - 126 U/L - - -  AST 15 - 41 U/L - - -  ALT 0 - 44 U/L - - -      Microbiology: No results found for this or any previous visit (from the past 240 hour(s)).  Radiographs and labs were personally reviewed by me.   Assessment and Plan [name]. is a 84 y.o. 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  Patient states he gets cellulitis versus frequently and a week ago he noted swelling of his left arm and with his PCP who prescribed him Keflex he took it for a few days but no response and then he was asked to go to the emergency department and he got  admitted to hospital on 12/22/2017.  He did not have any fever but complained of chills.  Left upper extremity cellulitis.  He has significant brawny edema which he states is  better than before.  Because of friable skin he is at risk for infection  and also he has a callus on his left elbow which may be a source as well. The concern is the edema is out of proportion to the infection. Need to r/o lymphedema- I palpated some lymph nodes ( VS fatty tissue) in the left axilla - he will need to follow up on this with his PCP. Will need 3 more days of cefdinir PO and will need a compression sleeve  Left lung adenocarcinoma s/p radiation  COPD- chronic steroid and home oxygen''  CKD   Discussed the management with patient and hospitalist  Tsosie Billing, MD  12/26/2017, 1:40 PM  Note:  This document was prepared using Dragon voice recognition software and may include unintentional dictation errors.

## 2017-12-26 NOTE — Progress Notes (Signed)
Pt ready for discharge home today per MD. Reviewed discharge instructions and prescriptions with pt and his son; all questions answered and pt verbalized understanding. PIV removed, VSS. Pt assisted to car via RN with all belongings.  [name]

## 2017-12-26 NOTE — Progress Notes (Signed)
[facility] [place] NAME: [name]    MR#:  [id number]  DATE OF BIRTH:  May 30, 1931  SUBJECTIVE:  CHIEF COMPLAINT:   Chief Complaint  Patient presents with  . Cellulitis  Patient without complaint, knee feeling much better, x-ray report noted for degenerative change only, infectious disease to see  REVIEW OF SYSTEMS:  CONSTITUTIONAL: No fever, fatigue or weakness.  EYES: No blurred or double vision.  EARS, NOSE, AND THROAT: No tinnitus or ear pain.  RESPIRATORY: No cough, shortness of breath, wheezing or hemoptysis.  CARDIOVASCULAR: No chest pain, orthopnea, edema.  GASTROINTESTINAL: No nausea, vomiting, diarrhea or abdominal pain.  GENITOURINARY: No dysuria, hematuria.  ENDOCRINE: No polyuria, nocturia,  HEMATOLOGY: No anemia, easy bruising or bleeding SKIN: No rash or lesion. MUSCULOSKELETAL: No joint pain or arthritis.   NEUROLOGIC: No tingling, numbness, weakness.  PSYCHIATRY: No anxiety or depression.   ROS  DRUG ALLERGIES:   Allergies  Allergen Reactions  . Contrast Media [Iodinated Diagnostic Agents] Shortness Of Breath  . Morphine And Related Other (See Comments)    Hallucinations   . Niacin And Related Dermatitis  . Other     Other reaction(s): SHORTNESS OF BREATH  . Amlodipine Rash    VITALS:  Blood pressure 128/86, pulse 74, temperature (!) 97.5 F (36.4 C), temperature source Oral, resp. rate 18, height 5\' 6"  (1.676 m), weight 87.6 kg, SpO2 97 %.  PHYSICAL EXAMINATION:  GENERAL:  84 y.o.-year-old patient lying in the bed with no acute distress.  EYES: Pupils equal, round, reactive to light and accommodation. No scleral icterus. Extraocular muscles intact.  HEENT: Head atraumatic, normocephalic. Oropharynx and nasopharynx clear.  NECK:  Supple, no jugular venous distention. No thyroid enlargement, no tenderness.  LUNGS: Normal breath sounds bilaterally, no wheezing, rales,rhonchi or crepitation. No use of  accessory muscles of respiration.  CARDIOVASCULAR: S1, S2 normal. No murmurs, rubs, or gallops.  ABDOMEN: Soft, nontender, nondistended. Bowel sounds present. No organomegaly or mass.  EXTREMITIES: No pedal edema, cyanosis, or clubbing.  NEUROLOGIC: Cranial nerves II through XII are intact. Muscle strength 5/5 in all extremities. Sensation intact. Gait not checked.  PSYCHIATRIC: The patient is alert and oriented x 3.  SKIN: No obvious rash, lesion, or ulcer.   Physical Exam LABORATORY PANEL:   CBC Recent Labs  Lab 12/25/17 0418  WBC 14.7*  HGB 13.6  HCT 39.3*  PLT 217   ------------------------------------------------------------------------------------------------------------------  Chemistries  Recent Labs  Lab 12/21/17 1717  12/25/17 0418  12/26/17 0355  NA 137   < > 141  --  138  K 4.5   < > 2.6*   < > 3.3*  CL 106   < > 104  --  103  CO2 25   < > 29  --  27  GLUCOSE 258*   < > 99  --  197*  BUN 35*   < > 40*  --  39*  CREATININE 1.58*   < > 1.59*  --  1.60*  CALCIUM 8.9   < > 8.5*  --  8.4*  MG  --    < > 2.2  --   --   AST 13*  --   --   --   --   ALT 13  --   --   --   --   ALKPHOS 58  --   --   --   --   BILITOT 0.6  --   --   --   --    < > =  values in this interval not displayed.   ------------------------------------------------------------------------------------------------------------------  Cardiac Enzymes No results for input(s): TROPONINI in the last 168 hours. ------------------------------------------------------------------------------------------------------------------  RADIOLOGY:  Dg Knee 1-2 Views Left  Result Date: 12/25/2017 CLINICAL DATA:  Left knee pain, no known injury, initial encounter EXAM: LEFT KNEE - 2 VIEW COMPARISON:  None. FINDINGS: Mild medial joint space narrowing is noted. Small joint effusion is seen. Diffuse vascular calcifications are noted. IMPRESSION: Mild degenerative change with joint effusion. Electronically Signed    By: Inez Catalina M.D.   On: 12/25/2017 15:57    ASSESSMENT AND PLAN:  *Acute left upper extremity cellulitiswith leukocytosis Resolving Patient failedoutpatient managementw/Keflex  Continueempiric IVUnasyn/vancomycin, follow-up on cultures, infectious disease to see  *Acute severe hypokalemia Improved Continue p.o. potassium, Spironolactone, magnesium level was normal, BMP in the morning  *Chronic atrial fibrillation Stable ContinueBB,Eliquis -dose adjusted to 2.5 mg twice a daybased on patient's age and kidney function.  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 Management plans discussed with the patient, family and they are in agreement.  CODE STATUS: full  TOTAL TIME TAKING CARE OF THIS PATIENT: 40 minutes.     POSSIBLE D/C IN 1-2 DAYS, DEPENDING ON CLINICAL CONDITION.   Avel Peace Salary M.D on 12/26/2017   Between 7am to 6pm - Pager - [phone]  After 6pm go to [email or web address] - password [email or web address] [facility]  Office  [phone]  CC: Primary care physician; Jearld Fenton, NP  Note: This dictation was prepared with Dragon dictation along with smaller phrase technology. Any transcriptional errors that result from this process are unintentional.

## 2017-12-26 NOTE — Progress Notes (Addendum)
Inpatient Diabetes Program Recommendations  AACE/ADA: New Consensus Statement on Inpatient Glycemic Control (2019)  Target Ranges:  Prepandial:   less than 140 mg/dL      Peak postprandial:   less than 180 mg/dL (1-2 hours)      Critically ill patients:  140 - 180 mg/dL   Results for [name], [name]" (MRN [id number]) as of 12/26/2017 09:31  Ref. Range 12/25/2017 08:51 12/25/2017 11:37 12/25/2017 16:58 12/25/2017 20:27 12/26/2017 08:14  Glucose-Capillary Latest Ref Range: 70 - 99 mg/dL 89 258 (H) 288 (H) 342 (H) 162 (H)   Review of Glycemic Control  Diabetes history: DM2 Outpatient Diabetes medications: Glipizide 5 BID, Januvia 100 QD, Levemir 42 QHS, Humalog 18 units with breakfast/20 units with lunch/16 units with supper Current orders for Inpatient glycemic control: Levemir 32 units QHS, Novolog 0-15 units TID with meals, Novolog 0-5 units QHS; Prednisone 10 mg QAM  Inpatient Diabetes Program Recommendations:  Insulin - Basal: Noted Lantus decreased from 42 to 32 units on 12/25/17 and fasting glucose 162 mg/dl today. Insulin - Meal Coverage: Post prandial glucose is consistently elevated. Please consider ordering Novolog 5 units TID with meals for meal coverage if patient eats at least 50% of meals.  Thanks, Barnie Alderman, RN, MSN, CDE Diabetes Coordinator Inpatient Diabetes Program [phone] (Team Pager from 8am to 5pm)

## 2017-12-26 NOTE — Care Management Important Message (Signed)
Copy of signed IM left with patient in room.  

## 2017-12-27 ENCOUNTER — Telehealth: Payer: Self-pay | Admitting: *Deleted

## 2017-12-27 NOTE — Discharge Summary (Signed)
[facility] NAME: [name]    MR#:  [id number]  DATE OF BIRTH:  04/07/1932  DATE OF ADMISSION:  12/21/2017 ADMITTING PHYSICIAN: Shawn Jo, MD  DATE OF DISCHARGE: 12/26/2017  5:30 PM  PRIMARY CARE PHYSICIAN: Shawn Fenton, NP    ADMISSION DIAGNOSIS:  Chronic atrial fibrillation ([place]) [I48.2] Cellulitis of left upper extremity [L03.114] Type 2 diabetes mellitus without complication, with long-term current use of insulin (HCC) [E11.9, Z79.4]  DISCHARGE DIAGNOSIS:  Active Problems:   Cellulitis of arm, left   SECONDARY DIAGNOSIS:   Past Medical History:  Diagnosis Date  . Arthritis   . CAD    a. MI 01/29/1996 tx'd w/ TPA @ [facility]; b. Myoview 06/2005: EF 50%, scar @ apex, mild peri-infarct ischemia  . Cancer ([place])    skin  . Chronic atrial fibrillation ([place])    a. since 2006; b. on warfarin  . Chronic diastolic CHF (congestive heart failure) ([place])    a. echo 04/2006: EF lower limits of nl, mod LVH, mild aortic root dilatation, & mild MR, biatrial enlargement; b. echo 04/2013: EF 60%, mod dilated LA, mild MR & TR, mod pulm HTN w/ RV systolic pressure 53, c. echo 04/21/14: EF 55-60%, unable to exclude WMA, severely dilated LA 6.6 cm, nl RVSP, mildly dilated aortic root  . COPD (chronic obstructive pulmonary disease) (HCC)    oxygen prn at home  . CVA [id number]   x2  . DM   . Falls   . GERD (gastroesophageal reflux disease)   . History of kidney stones   . HYPERLIPIDEMIA   . HYPERTENSION   . Kidney stone    a. s/p left ureteral stenting 04/24/14  . Left arm weakness    limited movement. S/P fall injury  . Neuropathy of both feet   . Poor balance   . Wears dentures    full upper and lower    HOSPITAL COURSE:  *Acute left upper extremity cellulitiswith leukocytosis Resolving Patient failedoutpatient managementw/Keflex  Treated with empiric IVUnasyn/vancomycin, infectious disease saw pt -Omnicef bid  for 3 more days - ok for discharge  *Acute severe hypokalemia Releted w/ p.o. potassium, Spironolactone,magnesium level was normal  *Chronic atrial fibrillation Stable ContinuedBB,Eliquis -dose adjusted to 2.5 mg twice a daybased on patient's age and kidney function.  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 Contrast Media [Iodinated Diagnostic Agents] Shortness Of Breath  . Morphine And Related Other (See Comments)    Hallucinations   . Niacin And Related Dermatitis  . Other     Other reaction(s): SHORTNESS OF BREATH  . Amlodipine Rash    DISCHARGE MEDICATIONS:   Allergies as of 12/26/2017      Reactions   Contrast Media [iodinated Diagnostic Agents] Shortness Of Breath   Morphine And Related Other (See Comments)   Hallucinations   Niacin And Related Dermatitis   Other    Other reaction(s): SHORTNESS OF BREATH   Amlodipine Rash      Medication List    STOP taking these medications   torsemide 20 MG tablet Commonly known as:  DEMADEX     TAKE these medications   acetaminophen 650 MG CR tablet Commonly known as:  TYLENOL Take 650 mg by mouth every 8 (eight) hours as needed for pain.   albuterol 108 (90 Base) MCG/ACT inhaler Commonly known as:  PROVENTIL HFA;VENTOLIN HFA USE 1 TO 2 INHALATIONS EVERY 4 HOURS AS NEEDED   ALPRAZolam 0.25 MG tablet Commonly known as:  XANAX Take 1 tablet (0.25 mg total) by mouth at bedtime as needed for anxiety.   apixaban 2.5 MG Tabs tablet Commonly known as:  ELIQUIS Take 1 tablet (2.5 mg total) by mouth 2 (two) times daily. What changed:    medication strength  how much to take   atorvastatin 40 MG  tablet Commonly known as:  LIPITOR Take 1 tablet (40 mg total) by mouth daily. What changed:  when to take this   cefdinir 300 MG capsule Commonly known as:  OMNICEF Take 1 capsule (300 mg total) by mouth 2 (two) times daily.   cephALEXin 250 MG capsule Commonly known as:  KEFLEX Take 1 capsule (250 mg total) by mouth 3 (three) times daily.   citalopram 10 MG tablet Commonly known as:  CELEXA TAKE 1 TABLET DAILY   clotrimazole 1 % cream Commonly known as:  LOTRIMIN Apply 1 application topically 2 (two) times daily.   Diclofenac Sodium 1.5 % Soln Place 2 mLs onto the skin 4 (four) times daily.   dorzolamide 2 % ophthalmic solution Commonly known as:  TRUSOPT Place 1 drop into both eyes 3 (three) times daily.   ezetimibe 10 MG tablet Commonly known as:  ZETIA Take 1 tablet (10 mg total) by mouth daily.   finasteride 5 MG tablet Commonly known as:  PROSCAR TAKE 1 TABLET DAILY   gabapentin 100 MG capsule Commonly known as:  NEURONTIN TAKE 1 CAPSULE THREE TIMES A DAY   glipiZIDE 5 MG tablet Commonly known as:  GLUCOTROL TAKE 1 TABLET TWICE A DAY BEFORE MEALS What changed:  See the new instructions.   HUMALOG KWIKPEN 100 UNIT/ML KiwkPen Generic drug:  insulin lispro INJECT 18 UNITS WITH BREAKFAST, 20 UNITS WITH LUNCH, AND 16 UNITS WITH DINNER What changed:  See the new instructions.   Insulin Pen Needle 32G X 4 MM Misc USE THREE TIMES A DAY FOR INSULIN ADMINISTRATION.   Insulin Syringe-Needle U-100 30G X 1/2" 1 ML Misc 1 each by Does not apply route 3 (three) times daily.   JANUVIA 100 MG tablet Generic drug:  sitaGLIPtin TAKE 1 TABLET DAILY   latanoprost 0.005 % ophthalmic solution Commonly known as:  XALATAN Place 1 drop into both eyes at bedtime.   LEVEMIR FLEXTOUCH 100 UNIT/ML Pen Generic drug:  Insulin Detemir INJECT 42 UNITS UNDER THE SKIN DAILY AT 10 P.M. What changed:  See the new instructions.   levothyroxine 50 MCG tablet Commonly known as:   SYNTHROID, LEVOTHROID TAKE 1 TABLET DAILY BEFORE BREAKFAST   metolazone 5 MG tablet Commonly known as:  ZAROXOLYN TAKE 1 TABLET DAILY AS NEEDED What changed:  reasons to take this   metoprolol succinate 50 MG 24 hr tablet Commonly known as:  TOPROL-XL TAKE 1 TABLET TWICE A DAY   nitroGLYCERIN 0.4 MG SL tablet Commonly known as:  NITROSTAT Place 0.4 mg under the tongue every 5 (five) minutes as needed.   nystatin-triamcinolone cream Commonly known as:  MYCOLOG II Apply 1 application topically 2 (two) times daily.   potassium chloride SA 20 MEQ tablet Commonly known as:  K-DUR,KLOR-CON Take 3 tablets (60 mEq total) by mouth 2 (two) times daily.   predniSONE 10 MG tablet Commonly known  as:  DELTASONE TAKE 1 TABLET DAILY WITH BREAKFAST   spironolactone 25 MG tablet Commonly known as:  ALDACTONE Take 1 tablet (25 mg total) by mouth daily.   SYMBICORT 160-4.5 MCG/ACT inhaler Generic drug:  budesonide-formoterol USE 2 INHALATIONS TWICE A DAY What changed:  See the new instructions.   tamsulosin 0.4 MG Caps capsule Commonly known as:  FLOMAX TAKE 1 CAPSULE DAILY   tiotropium 18 MCG inhalation capsule Commonly known as:  SPIRIVA Place 1 capsule (18 mcg total) daily into inhaler and inhale.        DISCHARGE INSTRUCTIONS:  If you experience worsening of your admission symptoms, develop shortness of breath, life threatening emergency, suicidal or homicidal thoughts you must seek medical attention immediately by calling 911 or calling your MD immediately  if symptoms less severe.  You Must read complete instructions/literature along with all the possible adverse reactions/side effects for all the Medicines you take and that have been prescribed to you. Take any new Medicines after you have completely understood and accept all the possible adverse reactions/side effects.   Please note  You were cared for by a hospitalist during your hospital stay. If you have any questions  about your discharge medications or the care you received while you were in the hospital after you are discharged, you can call the unit and asked to speak with the hospitalist on call if the hospitalist that took care of you is not available. Once you are discharged, your primary care physician will handle any further medical issues. Please note that NO REFILLS for any discharge medications will be authorized once you are discharged, as it is imperative that you return to your primary care physician (or establish a relationship with a primary care physician if you do not have one) for your aftercare needs so that they can reassess your need for medications and monitor your lab values.    Today   CHIEF COMPLAINT:   Chief Complaint  Patient presents with  . Cellulitis    HISTORY OF PRESENT ILLNESS:   84 y.o. male with a known history of diabetes type 2, CAD, chronic atrial fibrillation, on anticoagulation, diastolic CHF, COPD, remote stroke with left hemiparesis, peripheral neuropathy and other comorbidities. Patient presents to emergency room for moderate to severe left upper extremity swelling, redness and tenderness going on for the past week.  He was started on Keflex by his primary care physician, 2 days ago, without any improvement.  Patient did not check his temperature but he admits to subjective fever and chills.  No open wounds of recent trauma to the upper extremities, although patient admits to frequent falls. Patient had a similar episode, at his left lower extremity. Blood test done emergency room show creatinine at 1.58, glucose level at 258, lactic acid level at 1.8.  WBCs 18.7. Left upper extremity venous ultrasound is negative for DVT. Patient is admitted for further evaluation and treatment.  VITAL SIGNS:  Blood pressure 118/66, pulse 70, temperature (!) 97.5 F (36.4 C), temperature source Oral, resp. rate 18, height 5\' 6"  (1.676 m), weight 87.6 kg, SpO2 97 %.  I/O:     Intake/Output Summary (Last 24 hours) at 12/27/2017 0907 Last data filed at 12/26/2017 1300 Gross per 24 hour  Intake 240 ml  Output 200 ml  Net 40 ml    PHYSICAL EXAMINATION:  GENERAL:  84 y.o.-year-old patient lying in the bed with no acute distress.  EYES: Pupils equal, round, reactive to light and accommodation. No scleral  icterus. Extraocular muscles intact.  HEENT: Head atraumatic, normocephalic. Oropharynx and nasopharynx clear.  NECK:  Supple, no jugular venous distention. No thyroid enlargement, no tenderness.  LUNGS: Normal breath sounds bilaterally, no wheezing, rales,rhonchi or crepitation. No use of accessory muscles of respiration.  CARDIOVASCULAR: S1, S2 normal. No murmurs, rubs, or gallops.  ABDOMEN: Soft, non-tender, non-distended. Bowel sounds present. No organomegaly or mass.  EXTREMITIES: No pedal edema, cyanosis, or clubbing.  NEUROLOGIC: Cranial nerves II through XII are intact. Muscle strength 5/5 in all extremities. Sensation intact. Gait not checked.  PSYCHIATRIC: The patient is alert and oriented x 3.  SKIN: No obvious rash, lesion, or ulcer.   DATA REVIEW:   CBC Recent Labs  Lab 12/25/17 0418  WBC 14.7*  HGB 13.6  HCT 39.3*  PLT 217    Chemistries  Recent Labs  Lab 12/21/17 1717  12/25/17 0418  12/26/17 0355  NA 137   < > 141  --  138  K 4.5   < > 2.6*   < > 3.3*  CL 106   < > 104  --  103  CO2 25   < > 29  --  27  GLUCOSE 258*   < > 99  --  197*  BUN 35*   < > 40*  --  39*  CREATININE 1.58*   < > 1.59*  --  1.60*  CALCIUM 8.9   < > 8.5*  --  8.4*  MG  --    < > 2.2  --   --   AST 13*  --   --   --   --   ALT 13  --   --   --   --   ALKPHOS 58  --   --   --   --   BILITOT 0.6  --   --   --   --    < > = values in this interval not displayed.    Cardiac Enzymes No results for input(s): TROPONINI in the last 168 hours.  Microbiology Results  Results for orders placed or performed during the hospital encounter of 10/09/14  Blood  culture (routine x 2)     Status: None   Collection Time: 10/10/14  1:15 AM  Result Value Ref Range Status   Specimen Description BLOOD LEFT FATTY CASTS  Final   Special Requests BOTTLES DRAWN AEROBIC AND ANAEROBIC 25 CC  Final   Culture NO GROWTH 5 DAYS  Final   Report Status 10/15/2014 FINAL  Final  Blood culture (routine x 2)     Status: None   Collection Time: 10/10/14  1:15 AM  Result Value Ref Range Status   Specimen Description BLOOD RIGHT FATTY CASTS  Final   Special Requests BOTTLES DRAWN AEROBIC AND ANAEROBIC 25 CC  Final   Culture NO GROWTH 5 DAYS  Final   Report Status 10/15/2014 FINAL  Final    RADIOLOGY:  Dg Knee 1-2 Views Left  Result Date: 12/25/2017 CLINICAL DATA:  Left knee pain, no known injury, initial encounter EXAM: LEFT KNEE - 2 VIEW COMPARISON:  None. FINDINGS: Mild medial joint space narrowing is noted. Small joint effusion is seen. Diffuse vascular calcifications are noted. IMPRESSION: Mild degenerative change with joint effusion. Electronically Signed   By: Inez Catalina M.D.   On: 12/25/2017 15:57    EKG:   Orders placed or performed in visit on 01/10/17  . EKG 12-Lead      Management plans  discussed with the patient, family and they are in agreement.  CODE STATUS:  Code Status History    Date Active Date Inactive Code Status Order ID Comments User Context   12/22/2017 0613 12/26/2017 2049 Full Code [id number]  Shawn Jo, MD Inpatient   10/10/2016 1520 10/11/2016 1829 Full Code [id number]  Demetrios Loll, MD Inpatient   10/10/2014 0258 10/11/2014 1704 Full Code [id number]  Harrie Foreman, MD Inpatient      TOTAL TIME TAKING CARE OF THIS PATIENT: 40 minutes.    Avel Peace Trejan Buda M.D on 12/27/2017 at 9:07 AM  Between 7am to 6pm - Pager - [phone]  After 6pm go to [email or web address] - password [email or web address] [facility]  Office  [phone]  CC: Primary care physician; Shawn Fenton, NP   Note: This dictation was prepared with  Dragon dictation along with smaller phrase technology. Any transcriptional errors that result from this process are unintentional.

## 2017-12-27 NOTE — Telephone Encounter (Signed)
Lm requesting return call to complete TCM and confirm hosp f/u appt  

## 2017-12-28 ENCOUNTER — Telehealth: Payer: Self-pay | Admitting: Cardiovascular Disease

## 2017-12-28 ENCOUNTER — Ambulatory Visit: Payer: Self-pay

## 2017-12-28 ENCOUNTER — Other Ambulatory Visit: Payer: Self-pay | Admitting: Internal Medicine

## 2017-12-28 NOTE — Telephone Encounter (Signed)
Lm requesting return call to complete TCM and confirm hosp f/u appt  

## 2017-12-28 NOTE — Telephone Encounter (Signed)
No answer. Left message to call back with Tim at [facility]. Left message to call back.  Called patient. He had already been in contact with his PCP and received advice.  Patient last took spirolactone yesterday, 12/17/17. Today, he decided to restart his torsemide 40 mg in the morning and at lunch because he was not urinating well on the spironolactone. Since taking the torsemide, he's been urinating much better. Weight today was 188.6 lb (Normal between 180-182).  Patient is seeing PCP tomorrow. They advised for patient to keep appointment tomorrow and go to ED if symptoms worsens. Patient verbalized understanding to stick with this plan of care.  Will route to Dr Rockey Situ to make him aware.

## 2017-12-28 NOTE — Telephone Encounter (Signed)
[name] pts niece calling, pts wife on phone also (DPR signed) pt was discharged from [facility] on 12/26/17; since 12/26/17 pt has gained 4 lbs, has 3+ edema , SOB sitting worse when talking or breathing; home health nurse noted diminished lung sounds; pt not urinating a lot, pt is weak and light headed. Pt was advised by [facility] nurse to go to ED if could not be seen in office today. Pt restarted furosemide 40 mg 12/28/17 at 9:45 on is own. Pt still refusing to go to ED. Avie Echevaria NP out of office this morning. Dr Silvio Pate said pt could take another Furosemide 40 mg 3 hrs after taking the one this morning and if has improvement keep 15' appt on 12/29/17 at 2 PM but if no improvement pt has to go to ED. [name] said pt does not want to go back to [facility] ED. Advised could go to [facility] ED. Advised if pt condition changes or worsens should go to ED. [name] voiced understanding and will cb this afternoon after taking furosemide to give update on pt condition. FYI to Avie Echevaria NP.

## 2017-12-28 NOTE — Telephone Encounter (Signed)
Incoming call from Alla Feeling RN, visiting nurse from [facility].  Who states patient  Was discharged from the hospital on Monday has gained 4lbs since.  Has 3 plus edema lower extremities, SOB.  Patient states that he was put on a medication in hospital.  Felt like he was not voiding like he should be. Patient restarted himself on  Furosemide.  Per protocol recommended that patient go to ED.  Lung sounds are clear yet diminished states visiting nurse.  No distress noted. Denies fever. Provided care advice,  Patient  Does not want to go to ED.  Informed patient that I will route conversation to office. Patient awaits call from office.    Reason for Disposition . [1] MODERATE leg swelling (e.g., swelling extends up to knees) AND [2] new onset or worsening  Answer Assessment - Initial Assessment Questions 1. RESPIRATORY STATUS: "Describe your breathing?" (e.g., wheezing, shortness of breath, unable to speak, severe coughing)      Light  headed 2. ONSET: "When did this breathing problem begin?"      Last  Night.   3. PATTERN "Does the difficult breathing come and go, or has it been constant since it started?"    constant 4. SEVERITY: "How bad is your breathing?" (e.g., mild, moderate, severe)    - MILD: No SOB at rest, mild SOB with walking, speaks normally in sentences, can lay down, no retractions, pulse < 100.    - MODERATE: SOB at rest, SOB with minimal exertion and prefers to sit, cannot lie down flat, speaks in phrases, mild retractions, audible wheezing, pulse 100-120.    - SEVERE: Very SOB at rest, speaks in single words, struggling to breathe, sitting hunched forward, retractions, pulse > 120      moderate 5. RECURRENT SYMPTOM: "Have you had difficulty breathing before?" If so, ask: "When was the last time?" and "What happened that time?"      First time 6. CARDIAC HISTORY: "Do you have any history of heart disease?" (e.g., heart attack, angina, bypass surgery, angioplasty)     Heart attack   7. LUNG HISTORY: "Do you have any history of lung disease?"  (e.g., pulmonary embolus, asthma, emphysema)    COPD 8. CAUSE: "What do you think is causing the breathing problem?"      edemous lower extremm 9. OTHER SYMPTOMS: "Do you have any other symptoms? (e.g., dizziness, runny nose, cough, chest pain, fever)     Light headed no chest pain 10. PREGNANCY: "Is there any chance you are pregnant?" "When was your last menstrual period?"       na 11. TRAVEL: "Have you traveled out of the country in the last month?" (e.g., travel history, exposures)        Na  Answer Assessment - Initial Assessment Questions 1. ONSET: "When did the swelling start?" (e.g., minutes, hours, days)     Last night 2. LOCATION: "What part of the leg is swollen?"  "Are both legs swollen or just one leg?"      both 3. SEVERITY: "How bad is the swelling?" (e.g., localized; mild, moderate, severe)  - Localized - small area of swelling localized to one leg  - MILD pedal edema - swelling limited to foot and ankle, pitting edema < 1/4 inch (6 mm) deep, rest and elevation eliminate most or all swelling  - MODERATE edema - swelling of lower leg to knee, pitting edema > 1/4 inch (6 mm) deep, rest and elevation only partially reduce swelling  -  SEVERE edema - swelling extends above knee, facial or hand swelling present      moderate 4. REDNESS: "Does the swelling look red or infected?"     no 5. PAIN: "Is the swelling painful to touch?" If so, ask: "How painful is it?"   (Scale 1-10; mild, moderate or severe)     *No Answer* 6. FEVER: "Do you have a fever?" If so, ask: "What is it, how was it measured, and when did it start?"      denies 7. CAUSE: "What do you think is causing the leg swelling?"      New medication 8. MEDICAL HISTORY: "Do you have a history of heart failure, kidney disease, liver failure, or cancer?"     hhx of hear attack and stroke 9. RECURRENT SYMPTOM: "Have you had leg swelling  before?" If so, ask: "When was the last time?" "What happened that time?"     Not since being in hospital 10. OTHER SYMPTOMS: "Do you have any other symptoms?" (e.g., chest pain, difficulty breathing)        denies 11. PREGNANCY: "Is there any chance you are pregnant?" "When was your last menstrual period?"        Na  Protocols used: LEG SWELLING AND EDEMA-A-AH, BREATHING DIFFICULTY-A-AH

## 2017-12-28 NOTE — Telephone Encounter (Signed)
Spoke to pt and he reports he has been urinating more and notice that some swelling has gone down... Denies cardiac Sx or increase in SOB... Pt scheduled for a 30 min appt on Fri per Baity... Pt is aware if Sx worsen or develop cardiac Sx that he must go to ED

## 2017-12-28 NOTE — Telephone Encounter (Signed)
[name] calling from [place] Patient was in the hospital recently and during that visit he was told to stop torsemide and start spironolactone 25 MG [name] is speculating that they may have found a renal problem as they also cut his Eliquis in half His weight has gone up 10 pounds from baseline and patient is also complaining of SOB Patient will need to know some clarification on how to proceed, if we could review the hospital discharge summary  [name] will be faxing over information as well [name] can be reached at [phone] ext [phone] with any further questions

## 2017-12-28 NOTE — Telephone Encounter (Signed)
He has to have a 30 minute appt. Very complicated pt, recent hospitilaztion.

## 2017-12-29 ENCOUNTER — Ambulatory Visit: Payer: Medicare Other | Admitting: Internal Medicine

## 2017-12-29 ENCOUNTER — Telehealth: Payer: Self-pay

## 2017-12-29 NOTE — Telephone Encounter (Signed)
Flagged on EMMI report for having questions about discharge papers, unfilled prescriptions, and not knowing who to call about changes in condition.  First attempt to reach patient made 12/29/17 at 2:40pm, however niece informed me patient was taking a nap and not available. Will attempt at later time.

## 2017-12-29 NOTE — Telephone Encounter (Signed)
Patient returning call Would like to discuss Eliquis medication, hospital had dose cut in half and he would like to discuss with nurse if this is still what he should be doing  Please call to discuss

## 2017-12-29 NOTE — Telephone Encounter (Signed)
Spoke with patients wife per release form and made her aware of how dosages are changed based on several factors. She verbalized understanding to have him take medications as prescribed and to let us know if she should have any further questions. Tim with [facility] also called directly after hanging up. He was calling about the same thing. Reviewed what we discussed and advised that they were aware. Both patients wife and Tim with [facility] and they verbalized understanding with no further questions.

## 2017-12-30 ENCOUNTER — Encounter: Payer: Self-pay | Admitting: Internal Medicine

## 2017-12-30 ENCOUNTER — Ambulatory Visit (INDEPENDENT_AMBULATORY_CARE_PROVIDER_SITE_OTHER): Payer: Medicare Other | Admitting: Internal Medicine

## 2017-12-30 VITALS — BP 122/62 | HR 63 | Temp 97.8°F | Ht 66.0 in | Wt 184.8 lb

## 2017-12-30 DIAGNOSIS — Z23 Encounter for immunization: Secondary | ICD-10-CM | POA: Diagnosis not present

## 2017-12-30 DIAGNOSIS — L03114 Cellulitis of left upper limb: Secondary | ICD-10-CM

## 2017-12-30 DIAGNOSIS — I509 Heart failure, unspecified: Secondary | ICD-10-CM

## 2017-12-30 DIAGNOSIS — M25562 Pain in left knee: Secondary | ICD-10-CM | POA: Diagnosis not present

## 2017-12-30 MED ORDER — TRAMADOL HCL 50 MG PO TABS: 50.0000 mg | ORAL_TABLET | Freq: Three times a day (TID) | ORAL | 0 refills | Status: DC | PRN

## 2017-12-31 ENCOUNTER — Encounter: Payer: Self-pay | Admitting: Internal Medicine

## 2017-12-31 NOTE — Patient Instructions (Signed)

## 2017-12-31 NOTE — Progress Notes (Signed)
Subjective:    Patient ID: [name]., male    DOB: 08/15/1931, 84 y.o.   MRN: [id number]  HPI  Pt presents to the clinic today for hospital follow up. I saw him 9/9 for cellulitis of left upper extremity. He was started on Keflex. He was seen 9/11 for the same, with worsening cellulitis and was sent to the ER for further evaluation and treatment. They ruled him out for a left upper extremity DVT. He was treated with IV Unasyn and Vancomycin, then transitioned to [facility] prior to discharge. He reports they stopped his Torsemide and put him on Spironolactone. He was also c/o left knee pain in the hospital. Xray was consistent with arthritis. Since discharge, he reports he has gained 8 lbs, but has had improvement in the redness, swelling and pain of his left upper extremity. He stopped the Spironolactone and restarted his Torsemide. His weight is back down to baseline. He wants to know if he should continue the Keflex. He would like to know what else can be done for his knee pain.  Review of Systems      Past Medical History:  Diagnosis Date  . Arthritis   . CAD    a. MI 01/29/1996 tx'd w/ TPA @ [place]; b. Myoview 06/2005: EF 50%, scar @ apex, mild peri-infarct ischemia  . Cancer ([place])    skin  . Chronic atrial fibrillation ([place])    a. since 2006; b. on warfarin  . Chronic diastolic CHF (congestive heart failure) ([place])    a. echo 04/2006: EF lower limits of nl, mod LVH, mild aortic root dilatation, & mild MR, biatrial enlargement; b. echo 04/2013: EF 60%, mod dilated LA, mild MR & TR, mod pulm HTN w/ RV systolic pressure 53, c. echo 04/21/14: EF 55-60%, unable to exclude WMA, severely dilated LA 6.6 cm, nl RVSP, mildly dilated aortic root  . COPD (chronic obstructive pulmonary disease) ([facility])    oxygen prn at home  . CVA [id number]   x2  . DM   . Falls   . GERD (gastroesophageal reflux disease)   . History of kidney stones   . HYPERLIPIDEMIA   . HYPERTENSION   . Kidney stone    a.  s/p left ureteral stenting 04/24/14  . Left arm weakness    limited movement. S/P fall injury  . Neuropathy of both feet   . Poor balance   . Wears dentures    full upper and lower    Current Outpatient Medications  Medication Sig Dispense Refill  . acetaminophen (TYLENOL) 650 MG CR tablet Take 650 mg by mouth every 8 (eight) hours as needed for pain.     Marland Kitchen albuterol (PROAIR HFA) 108 (90 Base) MCG/ACT inhaler USE 1 TO 2 INHALATIONS EVERY 4 HOURS AS NEEDED 25.5 g 3  . ALPRAZolam (XANAX) 0.25 MG tablet Take 1 tablet (0.25 mg total) by mouth at bedtime as needed for anxiety. 90 tablet 0  . apixaban (ELIQUIS) 2.5 MG TABS tablet Take 1 tablet (2.5 mg total) by mouth 2 (two) times daily. 60 tablet 0  . atorvastatin (LIPITOR) 40 MG tablet Take 1 tablet (40 mg total) by mouth daily. (Patient taking differently: Take 40 mg by mouth daily at 6 PM. ) 90 tablet 0  . cefdinir (OMNICEF) 300 MG capsule Take 1 capsule (300 mg total) by mouth 2 (two) times daily. 6 capsule 0  . cephALEXin (KEFLEX) 250 MG capsule Take 1 capsule (250 mg total) by mouth  3 (three) times daily. 30 capsule 0  . citalopram (CELEXA) 10 MG tablet TAKE 1 TABLET DAILY 90 tablet 0  . clotrimazole (LOTRIMIN) 1 % cream Apply 1 application topically 2 (two) times daily. 30 g 0  . Diclofenac Sodium 1.5 % SOLN Place 2 mLs onto the skin 4 (four) times daily. 150 mL 5  . dorzolamide (TRUSOPT) 2 % ophthalmic solution Place 1 drop into both eyes 3 (three) times daily.     Marland Kitchen ezetimibe (ZETIA) 10 MG tablet Take 1 tablet (10 mg total) by mouth daily. 90 tablet 0  . finasteride (PROSCAR) 5 MG tablet TAKE 1 TABLET DAILY 90 tablet 4  . gabapentin (NEURONTIN) 100 MG capsule TAKE 1 CAPSULE THREE TIMES A DAY 270 capsule 1  . glipiZIDE (GLUCOTROL) 5 MG tablet TAKE 1 TABLET TWICE A DAY BEFORE MEALS (Patient taking differently: Take 5 mg by mouth 2 (two) times daily before a meal. ) 180 tablet 0  . HUMALOG KWIKPEN 100 UNIT/ML KiwkPen INJECT 18 UNITS WITH  BREAKFAST, 20 UNITS WITH LUNCH, AND 16 UNITS WITH DINNER (Patient taking differently: Inject 16-20 Units into the skin 3 (three) times daily. 18 units at breakfast, 20 units at lunch, and 16 units at dinner) 60 mL 0  . Insulin Pen Needle (BD PEN NEEDLE NANO U/F) 32G X 4 MM MISC USE THREE TIMES A DAY FOR INSULIN ADMINISTRATION. 270 each 1  . Insulin Syringe-Needle U-100 30G X 1/2" 1 ML MISC 1 each by Does not apply route 3 (three) times daily. 200 each 5  . JANUVIA 100 MG tablet TAKE 1 TABLET DAILY 90 tablet 1  . latanoprost (XALATAN) 0.005 % ophthalmic solution Place 1 drop into both eyes at bedtime.     Marland Kitchen LEVEMIR FLEXTOUCH 100 UNIT/ML Pen INJECT 42 UNITS UNDER THE SKIN DAILY AT 10 P.M. (Patient taking differently: Inject 42 Units into the skin at bedtime. ) 45 mL 1  . levothyroxine (SYNTHROID, LEVOTHROID) 50 MCG tablet TAKE 1 TABLET DAILY BEFORE BREAKFAST 90 tablet 0  . metolazone (ZAROXOLYN) 5 MG tablet TAKE 1 TABLET DAILY AS NEEDED (Patient taking differently: Take 5 mg by mouth daily as needed (swelling). ) 90 tablet 2  . metoprolol succinate (TOPROL-XL) 50 MG 24 hr tablet TAKE 1 TABLET TWICE A DAY 180 tablet 0  . nitroGLYCERIN (NITROSTAT) 0.4 MG SL tablet Place 0.4 mg under the tongue every 5 (five) minutes as needed.      . nystatin-triamcinolone (MYCOLOG II) cream Apply 1 application topically 2 (two) times daily.     . potassium chloride SA (K-DUR,KLOR-CON) 20 MEQ tablet Take 3 tablets (60 mEq total) by mouth 2 (two) times daily. 540 tablet 0  . predniSONE (DELTASONE) 10 MG tablet TAKE 1 TABLET DAILY WITH BREAKFAST 90 tablet 1  . spironolactone (ALDACTONE) 25 MG tablet Take 1 tablet (25 mg total) by mouth daily. 30 tablet 0  . SYMBICORT 160-4.5 MCG/ACT inhaler USE 2 INHALATIONS TWICE A DAY (Patient taking differently: Inhale 2 puffs into the lungs 2 (two) times daily. ) 30.6 g 3  . tamsulosin (FLOMAX) 0.4 MG CAPS capsule TAKE 1 CAPSULE DAILY 90 capsule 1  . tiotropium (SPIRIVA HANDIHALER) 18  MCG inhalation capsule Place 1 capsule (18 mcg total) daily into inhaler and inhale. 90 capsule 3  . traMADol (ULTRAM) 50 MG tablet Take 1 tablet (50 mg total) by mouth every 8 (eight) hours as needed. 15 tablet 0   No current facility-administered medications for this visit.  Allergies  Allergen Reactions  . Contrast Media [Iodinated Diagnostic Agents] Shortness Of Breath  . Morphine And Related Other (See Comments)    Hallucinations   . Niacin And Related Dermatitis  . Other     Other reaction(s): SHORTNESS OF BREATH  . Amlodipine Rash    Family History  Problem Relation Age of Onset  . Heart disease Mother   . Heart disease Maternal Grandmother   . Diabetes Maternal Grandmother   . Cancer Neg Hx   . Stroke Neg Hx     Social History   Socioeconomic History  . Marital status: Married    Spouse name: Not on file  . Number of children: Not on file  . Years of education: Not on file  . Highest education level: Not on file  Occupational History  . Not on file  Social Needs  . Financial resource strain: Not on file  . Food insecurity:    Worry: Not on file    Inability: Not on file  . Transportation needs:    Medical: Not on file    Non-medical: Not on file  Tobacco Use  . Smoking status: Former Smoker    Packs/day: 2.00    Years: 40.00    Pack years: 80.00    Types: Cigarettes    Last attempt to quit: 05/24/1990    Years since quitting: 27.6  . Smokeless tobacco: Former Systems developer    Quit date: 05/24/1990  Substance and Sexual Activity  . Alcohol use: No  . Drug use: No  . Sexual activity: Not Currently  Lifestyle  . Physical activity:    Days per week: Not on file    Minutes per session: Not on file  . Stress: Not on file  Relationships  . Social connections:    Talks on phone: Not on file    Gets together: Not on file    Attends religious service: Not on file    Active member of club or organization: Not on file    Attends meetings of clubs or  organizations: Not on file    Relationship status: Not on file  . Intimate partner violence:    Fear of current or ex partner: Not on file    Emotionally abused: Not on file    Physically abused: Not on file    Forced sexual activity: Not on file  Other Topics Concern  . Not on file  Social History Narrative  . Not on file     Constitutional: Denies fever, malaise, fatigue, headache or abrupt weight changes.  Respiratory: Denies difficulty breathing, shortness of breath, cough or sputum production.   Cardiovascular: Denies chest pain, chest tightness, palpitations or swelling in the hands or feet.  Musculoskeletal: Pt reports left knee pain. Denies decrease in range of motion, difficulty with gait, muscle painswelling.  Skin: Denies redness, rashes, lesions or ulcercations.    No other specific complaints in a complete review of systems (except as listed in HPI above).  Objective:   Physical Exam   BP 122/62 (BP Location: Right Arm, Patient Position: Sitting, Cuff Size: Normal)   Pulse 63   Temp 97.8 F (36.6 C) (Oral)   Ht 5\' 6"  (1.676 m)   Wt 184 lb 12 oz (83.8 kg)   SpO2 98%   BMI 29.82 kg/m  Wt Readings from Last 3 Encounters:  12/30/17 184 lb 12 oz (83.8 kg)  12/26/17 193 lb 2 oz (87.6 kg)  12/21/17 184 lb (83.5 kg)  General: Appears his stated age, chronically ill appearing,  in NAD. Skin: No redness, warmth or swelling noted of left upper extremity. Cardiovascular: Normal rate and rhythm. S1,S2 noted.  No murmur, rubs or gallops noted. Trace BLE edema noted. Pulmonary/Chest: Normal effort and positive vesicular breath sounds. No respiratory distress. No wheezes, rales or ronchi noted.  Musculoskeletal: Normal flexion, extension of the left knee. Pain with palpation of the lateral joint line. No swelling noted. Gait slow but steady with use of rolling walker.   BMET    Component Value Date/Time   NA 138 12/26/2017 0355   NA 143 02/21/2015 1108   NA 137  08/05/2014 1839   K 3.3 (L) 12/26/2017 0355   K 3.6 08/05/2014 1839   CL 103 12/26/2017 0355   CL 106 08/05/2014 1839   CO2 27 12/26/2017 0355   CO2 23 08/05/2014 1839   GLUCOSE 197 (H) 12/26/2017 0355   GLUCOSE 235 (H) 08/05/2014 1839   BUN 39 (H) 12/26/2017 0355   BUN 26 02/21/2015 1108   BUN 19 08/05/2014 1839   CREATININE 1.60 (H) 12/26/2017 0355   CREATININE 1.20 08/05/2014 1839   CALCIUM 8.4 (L) 12/26/2017 0355   CALCIUM 8.6 (L) 08/05/2014 1839   GFRNONAA 38 (L) 12/26/2017 0355   GFRNONAA 57 (L) 08/05/2014 1839   GFRAA 44 (L) 12/26/2017 0355   GFRAA >60 08/05/2014 1839    Lipid Panel     Component Value Date/Time   CHOL 104 11/22/2016 1019   TRIG 154.0 (H) 11/22/2016 1019   HDL 43.80 11/22/2016 1019   CHOLHDL 2 11/22/2016 1019   VLDL 30.8 11/22/2016 1019   LDLCALC 29 11/22/2016 1019    CBC    Component Value Date/Time   WBC 14.7 (H) 12/25/2017 0418   RBC 4.57 12/25/2017 0418   HGB 13.6 12/25/2017 0418   HGB 13.3 08/05/2014 1839   HCT 39.3 (L) 12/25/2017 0418   HCT 40.1 08/05/2014 1839   PLT 217 12/25/2017 0418   PLT 246 08/05/2014 1839   MCV 86.1 12/25/2017 0418   MCV 83 08/05/2014 1839   MCH 29.7 12/25/2017 0418   MCHC 34.6 12/25/2017 0418   RDW 17.4 (H) 12/25/2017 0418   RDW 16.3 (H) 08/05/2014 1839   LYMPHSABS 1.6 12/21/2017 1717   LYMPHSABS 2.3 08/05/2014 1839   MONOABS 1.6 (H) 12/21/2017 1717   MONOABS 2.0 (H) 08/05/2014 1839   EOSABS 0.0 12/21/2017 1717   EOSABS 0.1 08/05/2014 1839   BASOSABS 0.1 12/21/2017 1717   BASOSABS 0.1 08/05/2014 1839    Hgb A1C Lab Results  Component Value Date   HGBA1C 6.7 (H) 02/15/2017           Assessment & Plan:   Hospital Follow Up Cellulitis of Left Upper Extremity, Left Knee Pain, CHF:  Hospital notes, labs and imaging reviewed He will continue Torsemide, Metolazone and d/c Spironolactone He has finished Omnicef, no need to continue Keflex eRx for Tramadol for left knee pain Advised him to  make a follow up appt with Dr. Lorelei Pont for further evaluation  Return precautions discussed Webb Silversmith, NP

## 2018-01-02 ENCOUNTER — Encounter: Payer: Medicare Other | Admitting: Physician Assistant

## 2018-01-02 DIAGNOSIS — Z8249 Family history of ischemic heart disease and other diseases of the circulatory system: Secondary | ICD-10-CM | POA: Diagnosis not present

## 2018-01-02 DIAGNOSIS — E11622 Type 2 diabetes mellitus with other skin ulcer: Secondary | ICD-10-CM | POA: Diagnosis not present

## 2018-01-02 DIAGNOSIS — E1140 Type 2 diabetes mellitus with diabetic neuropathy, unspecified: Secondary | ICD-10-CM | POA: Diagnosis not present

## 2018-01-02 DIAGNOSIS — Z7901 Long term (current) use of anticoagulants: Secondary | ICD-10-CM | POA: Diagnosis not present

## 2018-01-02 DIAGNOSIS — Z8673 Personal history of transient ischemic attack (TIA), and cerebral infarction without residual deficits: Secondary | ICD-10-CM | POA: Diagnosis not present

## 2018-01-02 DIAGNOSIS — I252 Old myocardial infarction: Secondary | ICD-10-CM | POA: Diagnosis not present

## 2018-01-02 DIAGNOSIS — M1990 Unspecified osteoarthritis, unspecified site: Secondary | ICD-10-CM | POA: Diagnosis not present

## 2018-01-02 DIAGNOSIS — Z9989 Dependence on other enabling machines and devices: Secondary | ICD-10-CM | POA: Diagnosis not present

## 2018-01-02 DIAGNOSIS — J449 Chronic obstructive pulmonary disease, unspecified: Secondary | ICD-10-CM | POA: Diagnosis not present

## 2018-01-02 DIAGNOSIS — I5042 Chronic combined systolic (congestive) and diastolic (congestive) heart failure: Secondary | ICD-10-CM | POA: Diagnosis not present

## 2018-01-02 DIAGNOSIS — Z923 Personal history of irradiation: Secondary | ICD-10-CM | POA: Diagnosis not present

## 2018-01-02 DIAGNOSIS — I890 Lymphedema, not elsewhere classified: Secondary | ICD-10-CM | POA: Diagnosis not present

## 2018-01-02 DIAGNOSIS — G4730 Sleep apnea, unspecified: Secondary | ICD-10-CM | POA: Diagnosis not present

## 2018-01-02 DIAGNOSIS — L97822 Non-pressure chronic ulcer of other part of left lower leg with fat layer exposed: Secondary | ICD-10-CM | POA: Diagnosis not present

## 2018-01-02 DIAGNOSIS — Z87891 Personal history of nicotine dependence: Secondary | ICD-10-CM | POA: Diagnosis not present

## 2018-01-02 DIAGNOSIS — I110 Hypertensive heart disease with heart failure: Secondary | ICD-10-CM | POA: Diagnosis not present

## 2018-01-02 DIAGNOSIS — I4891 Unspecified atrial fibrillation: Secondary | ICD-10-CM | POA: Diagnosis not present

## 2018-01-03 ENCOUNTER — Telehealth: Payer: Self-pay | Admitting: Internal Medicine

## 2018-01-03 NOTE — Telephone Encounter (Signed)
Ok to continue PT x 1 month

## 2018-01-03 NOTE — Telephone Encounter (Signed)
Copied from [place] [id number]. Topic: General - Other >> Jan 03, 2018 12:58 PM Valla Leaver wrote: Reason for CRM:  Laurey Arrow, PT with [facility] calling find out if [name] will give verbal for continued physical therapy at home for 1 month?

## 2018-01-04 NOTE — Telephone Encounter (Signed)
VO given to [facility] as instructed

## 2018-01-04 NOTE — Progress Notes (Signed)
ASHFORD, CLOUSE ([id number]) Visit Report for 01/02/2018 Chief Complaint Document Details Patient Name: [name], [name]. Date of Service: 01/02/2018 11:15 AM Medical Record Number: [id number] Patient Account Number: [id number] Date of Birth/Sex: 1931/10/16 (84 y.o. M) Treating RN: Roger Shelter Primary Care Provider: Webb Silversmith Other Clinician: Referring Provider: Webb Silversmith Treating Provider/Extender: Melburn Hake, Andreal Vultaggio Weeks in Treatment: 3 Information Obtained from: Patient Chief Complaint Left lateral LE ulcer Electronic Signature(s) Signed: 01/03/2018 9:18:02 AM By: Worthy Keeler PA-C Entered By: Worthy Keeler on 01/02/2018 11:04:16 Poirier, Gavin Pound ([id number]) -------------------------------------------------------------------------------- HPI Details Patient Name: [name] T. Date of Service: 01/02/2018 11:15 AM Medical Record Number: [id number] Patient Account Number: [id number] Date of Birth/Sex: May 27, 1931 (84 y.o. M) Treating RN: Roger Shelter Primary Care Provider: Webb Silversmith Other Clinician: Referring Provider: Webb Silversmith Treating Provider/Extender: Melburn Hake, Tiawanna Luchsinger Weeks in Treatment: 3 History of Present Illness Associated Signs and Symptoms: Patient has a history of lymphedema, type II diabetes mellitus, long-term use of Eliquis, congestive heart failure, and COPD. HPI Description: 12/06/17 on evaluation today patient is here for evaluation of an ulcer on the left lateral lower extremity which has been present for roughly 2-3 months. Currently he has been using silver alginate and it was considered putting the patient in an [employer] wrap. However he did have arterial studies which actually showed to be fairly normal on the right however on the left he did have an ABI of 0.75. With that being said this ABI was consistent with at least mild-to-moderate arterial disease. The patient actually has some question on the test as to whether or not this  result was actually even accurate his arterial disease could be more significant. For that reason I think he may need to see a vascular specialist to see about possibly undergoing an angiogram to see if arterial flow could be improved. With that being said it does appear that the patient rather desperately needs compression therapy he has significant edema of the bilateral lower extremities noted during evaluation today. No fevers, chills, nausea, or vomiting noted at this time. 12/16/17 on evaluation today patient actually appears to be doing better in regard to his lower extremity ulcers of the left lower extremity. Both wounds are significantly smaller and improved compared to the initial evaluation last week. Overall I'm very pleased with the progress at this time. The patient likewise is pleased with how things appear. He states that he has thought about it and really does not want to have an angiogram that would prefer not to go see the vascular specialist which we had recommended last week. He states when he had this previous on top of the fact that he had issues with getting the groin area to stop bleeding he states that the die also seemed to cause issues in fact he states through him into having another heart attack. Obviously he has good reasons to want to try to avoid this which I completely understand. 01/02/18 on evaluation today patient actually appears to be showing signs of good improvement which is excellent news. He has been tolerating the dressing changes without complication. Unfortunately he was in the hospital due to an infection of his left forearm he was on IV antibiotics for six days. The good news is he seems to be doing much better in that regard his left lower extremity also is doing better at this point. There does not appear to be any evidence of infection at this time which is great news. Overall I'm  very pleased with the progress he seems to be making. Electronic  Signature(s) Signed: 01/03/2018 9:18:02 AM By: Worthy Keeler PA-C Entered By: Worthy Keeler on 01/02/2018 16:52:00 [place]beck, Gavin Pound ([id number]) -------------------------------------------------------------------------------- Physical Exam Details Patient Name: [name] T. Date of Service: 01/02/2018 11:15 AM Medical Record Number: [id number] Patient Account Number: [id number] Date of Birth/Sex: October 19, 1931 (84 y.o. M) Treating RN: Roger Shelter Primary Care Provider: Webb Silversmith Other Clinician: Referring Provider: Webb Silversmith Treating Provider/Extender: STONE III, Garrin Kirwan Weeks in Treatment: 3 Constitutional Well-nourished and well-hydrated in no acute distress. Respiratory normal breathing without difficulty. clear to auscultation bilaterally. Cardiovascular regular rate and rhythm with normal S1, S2. Psychiatric this patient is able to make decisions and demonstrates good insight into disease process. Alert and Oriented x 3. pleasant and cooperative. Notes Currently the patient seems to be showing signs of excellent improvement in my opinion as far as the left lower extremity ulcers are concerned. In fact he just seems to have one small area still remaining open at this time that actually is doing significantly better. I think is very close to complete closure. Electronic Signature(s) Signed: 01/03/2018 9:18:02 AM By: Worthy Keeler PA-C Entered By: Worthy Keeler on 01/02/2018 16:52:47 Rubel, Gavin Pound ([id number]) -------------------------------------------------------------------------------- Physician Orders Details Patient Name: [name] T. Date of Service: 01/02/2018 11:15 AM Medical Record Number: [id number] Patient Account Number: [id number] Date of Birth/Sex: 1931/05/19 (84 y.o. M) Treating RN: Roger Shelter Primary Care Provider: Webb Silversmith Other Clinician: Referring Provider: Webb Silversmith Treating Provider/Extender: Melburn Hake, Tychelle Purkey Weeks  in Treatment: 3 Verbal / Phone Orders: No Diagnosis Coding ICD-10 Coding Code Description I89.0 Lymphedema, not elsewhere classified L97.822 Non-pressure chronic ulcer of other part of left lower leg with fat layer exposed E11.622 Type 2 diabetes mellitus with other skin ulcer Z79.01 Long term (current) use of anticoagulants I50.42 Chronic combined systolic (congestive) and diastolic (congestive) heart failure J44.9 Chronic obstructive pulmonary disease, unspecified Wound Cleansing Wound #2 Left,Distal,Lateral Lower Leg o Clean wound with Normal Saline. o Cleanse wound with mild soap and water o May Shower, gently pat wound dry prior to applying new dressing. Anesthetic (add to Medication List) Wound #2 Left,Distal,Lateral Lower Leg o Topical Lidocaine 4% cream applied to wound bed prior to debridement (In Clinic Only). Primary Wound Dressing Wound #2 Left,Distal,Lateral Lower Leg o Silver Alginate Secondary Dressing Wound #2 Left,Distal,Lateral Lower Leg o ABD pad o Conform/Kerlix - cover with tubigrip Dressing Change Frequency Wound #2 Left,Distal,Lateral Lower Leg o Change Dressing Monday, Wednesday, Friday o Other: - as needed Follow-up Appointments Wound #2 Left,Distal,Lateral Lower Leg o Return Appointment in 2 weeks. Edema Control Wound #2 Left,Distal,Lateral Lower Leg o Elevate legs to the level of the heart and pump ankles as often as possible Mcnee, Royston T. ([id number]) o Other: - tubigrip Additional Orders / Instructions Wound #2 Left,Distal,Lateral Lower Leg o Increase protein intake. Home Health Wound #2 Left,Distal,Lateral Lower Leg o [place] Nurse may visit PRN to address patientos wound care needs. o FACE TO FACE ENCOUNTER: MEDICARE and MEDICAID PATIENTS: I certify that this patient is under my care and that I had a face-to-face encounter that meets the physician face-to-face encounter  requirements with this patient on this date. The encounter with the patient was in whole or in part for the following MEDICAL CONDITION: (primary reason for [place]) MEDICAL NECESSITY: I certify, that based on my findings, NURSING services are a medically necessary home health service. HOME BOUND STATUS:  I certify that my clinical findings support that this patient is homebound (i.e., Due to illness or injury, pt requires aid of supportive devices such as crutches, cane, wheelchairs, walkers, the use of special transportation or the assistance of another person to leave their place of residence. There is a normal inability to leave the home and doing so requires considerable and taxing effort. Other absences are for medical reasons / religious services and are infrequent or of short duration when for other reasons). o If current dressing causes regression in wound condition, may D/C ordered dressing product/s and apply Normal Saline Moist Dressing daily until next [place] / Other MD appointment. [place] of regression in wound condition at [phone]. o Please direct any NON-WOUND related issues/requests for orders to patient's Primary Care Physician Electronic Signature(s) Signed: 01/03/2018 9:18:02 AM By: Worthy Keeler PA-C Signed: 01/03/2018 10:33:34 AM By: Roger Shelter Entered By: Roger Shelter on 01/02/2018 11:47:00 Connolly, Gavin Pound ([id number]) -------------------------------------------------------------------------------- Problem List Details Patient Name: ABDIEL, BLACKERBY T. Date of Service: 01/02/2018 11:15 AM Medical Record Number: [id number] Patient Account Number: [id number] Date of Birth/Sex: [birth year] (84 y.o. M) Treating RN: Roger Shelter Primary Care Provider: Webb Silversmith Other Clinician: Referring Provider: Webb Silversmith Treating Provider/Extender: Melburn Hake, Shya Kovatch Weeks in Treatment: 3 Active  Problems ICD-10 Evaluated Encounter Code Description Active Date Today Diagnosis I89.0 Lymphedema, not elsewhere classified 12/06/2017 No Yes L97.822 Non-pressure chronic ulcer of other part of left lower leg with 12/06/2017 No Yes fat layer exposed E11.622 Type 2 diabetes mellitus with other skin ulcer 12/06/2017 No Yes Z79.01 Long term (current) use of anticoagulants 12/06/2017 No Yes I50.42 Chronic combined systolic (congestive) and diastolic 8/65/7846 No Yes (congestive) heart failure J44.9 Chronic obstructive pulmonary disease, unspecified 12/06/2017 No Yes Inactive Problems Resolved Problems Electronic Signature(s) Signed: 01/03/2018 9:18:02 AM By: Worthy Keeler PA-C Entered By: Worthy Keeler on 01/02/2018 11:04:10 Gidley, Gavin Pound ([id number]) -------------------------------------------------------------------------------- Progress Note Details Patient Name: [name] T. Date of Service: 01/02/2018 11:15 AM Medical Record Number: [id number] Patient Account Number: [id number] Date of Birth/Sex: [birth year] (84 y.o. M) Treating RN: Roger Shelter Primary Care Provider: Webb Silversmith Other Clinician: Referring Provider: Webb Silversmith Treating Provider/Extender: Melburn Hake, Yahia Bottger Weeks in Treatment: 3 Subjective Chief Complaint Information obtained from Patient Left lateral LE ulcer History of Present Illness (HPI) The following HPI elements were documented for the patient's wound: Associated Signs and Symptoms: Patient has a history of lymphedema, type II diabetes mellitus, long-term use of Eliquis, congestive heart failure, and COPD. 12/06/17 on evaluation today patient is here for evaluation of an ulcer on the left lateral lower extremity which has been present for roughly 2-3 months. Currently he has been using silver alginate and it was considered putting the patient in an [employer] wrap. However he did have arterial studies which actually showed to be fairly normal on  the right however on the left he did have an ABI of 0.75. With that being said this ABI was consistent with at least mild-to-moderate arterial disease. The patient actually has some question on the test as to whether or not this result was actually even accurate his arterial disease could be more significant. For that reason I think he may need to see a vascular specialist to see about possibly undergoing an angiogram to see if arterial flow could be improved. With that being said it does appear that the patient rather desperately needs compression therapy he has significant edema of the bilateral lower extremities  noted during evaluation today. No fevers, chills, nausea, or vomiting noted at this time. 12/16/17 on evaluation today patient actually appears to be doing better in regard to his lower extremity ulcers of the left lower extremity. Both wounds are significantly smaller and improved compared to the initial evaluation last week. Overall I'm very pleased with the progress at this time. The patient likewise is pleased with how things appear. He states that he has thought about it and really does not want to have an angiogram that would prefer not to go see the vascular specialist which we had recommended last week. He states when he had this previous on top of the fact that he had issues with getting the groin area to stop bleeding he states that the die also seemed to cause issues in fact he states through him into having another heart attack. Obviously he has good reasons to want to try to avoid this which I completely understand. 01/02/18 on evaluation today patient actually appears to be showing signs of good improvement which is excellent news. He has been tolerating the dressing changes without complication. Unfortunately he was in the hospital due to an infection of his left forearm he was on IV antibiotics for six days. The good news is he seems to be doing much better in that regard his  left lower extremity also is doing better at this point. There does not appear to be any evidence of infection at this time which is great news. Overall I'm very pleased with the progress he seems to be making. Patient History Information obtained from Patient. Family History Diabetes - Maternal Grandparents, Heart Disease - Paternal Grandparents, Hypertension - Paternal Grandparents, Lung Disease - Mother, No family history of Cancer, Hereditary Spherocytosis, Kidney Disease, Seizures, Stroke, Thyroid Problems, Tuberculosis. Social History Former smoker - quit in 1993, Marital Status - Married, Alcohol Use - Never, Drug Use - No History, Caffeine Use - Daily. Medical And Surgical History Notes BHAVESH, VAZQUEZ ([id number]) Neurologic CVA in 1999 Review of Systems (ROS) Constitutional Symptoms (General Health) Denies complaints or symptoms of Fever, Chills. Respiratory The patient has no complaints or symptoms. Cardiovascular The patient has no complaints or symptoms. Psychiatric The patient has no complaints or symptoms. Objective Constitutional Well-nourished and well-hydrated in no acute distress. Vitals Time Taken: 11:15 AM, Height: 66 in, Weight: 182 lbs, BMI: 29.4, Temperature: 97.8 F, Pulse: 55 bpm, Respiratory Rate: 18 breaths/min, Blood Pressure: 115/58 mmHg. Respiratory normal breathing without difficulty. clear to auscultation bilaterally. Cardiovascular regular rate and rhythm with normal S1, S2. Psychiatric this patient is able to make decisions and demonstrates good insight into disease process. Alert and Oriented x 3. pleasant and cooperative. General Notes: Currently the patient seems to be showing signs of excellent improvement in my opinion as far as the left lower extremity ulcers are concerned. In fact he just seems to have one small area still remaining open at this time that actually is doing significantly better. I think is very close to complete  closure. Integumentary (Hair, Skin) Wound #1 status is Healed - Epithelialized. Original cause of wound was Gradually Appeared. The wound is located on the Left,Proximal,Lateral Lower Leg. The wound measures 0cm length x 0cm width x 0cm depth; 0cm^2 area and 0cm^3 volume. There is Fat Layer (Subcutaneous Tissue) Exposed exposed. There is no tunneling or undermining noted. There is a none present amount of drainage noted. The wound margin is flat and intact. There is no granulation within the wound bed. There  is no necrotic tissue within the wound bed. The periwound skin appearance exhibited: Hemosiderin Staining. The periwound skin appearance did not exhibit: Callus, Crepitus, Excoriation, Induration, Rash, Scarring, Dry/Scaly, Maceration, Atrophie Blanche, Cyanosis, Ecchymosis, Mottled, Pallor, Rubor, Erythema. Periwound temperature was noted as No Abnormality. Wound #2 status is Open. Original cause of wound was Gradually Appeared. The wound is located on the Left,Distal,Lateral Lower Leg. The wound measures 0.1cm length x 0.1cm width x 0.1cm depth; 0.008cm^2 area and 0.001cm^3 volume. There is no tunneling or undermining noted. There is a none present amount of drainage noted. The wound margin is flat and intact. There is no granulation within the wound bed. There is no necrotic tissue within the wound bed. The periwound skin appearance had no abnormalities noted for texture. The periwound skin appearance exhibited: Erythema. The periwound skin Popper, Muneer T. ([id number]) appearance did not exhibit: Dry/Scaly, Maceration. The surrounding wound skin color is noted with erythema. Periwound temperature was noted as No Abnormality. Assessment Active Problems ICD-10 Lymphedema, not elsewhere classified Non-pressure chronic ulcer of other part of left lower leg with fat layer exposed Type 2 diabetes mellitus with other skin ulcer Long term (current) use of anticoagulants Chronic combined  systolic (congestive) and diastolic (congestive) heart failure Chronic obstructive pulmonary disease, unspecified Plan Wound Cleansing: Wound #2 Left,Distal,Lateral Lower Leg: Clean wound with Normal Saline. Cleanse wound with mild soap and water May Shower, gently pat wound dry prior to applying new dressing. Anesthetic (add to Medication List): Wound #2 Left,Distal,Lateral Lower Leg: Topical Lidocaine 4% cream applied to wound bed prior to debridement (In Clinic Only). Primary Wound Dressing: Wound #2 Left,Distal,Lateral Lower Leg: Silver Alginate Secondary Dressing: Wound #2 Left,Distal,Lateral Lower Leg: ABD pad Conform/Kerlix - cover with tubigrip Dressing Change Frequency: Wound #2 Left,Distal,Lateral Lower Leg: Change Dressing Monday, Wednesday, Friday Other: - as needed Follow-up Appointments: Wound #2 Left,Distal,Lateral Lower Leg: Return Appointment in 2 weeks. Edema Control: Wound #2 Left,Distal,Lateral Lower Leg: Elevate legs to the level of the heart and pump ankles as often as possible Other: - tubigrip Additional Orders / Instructions: Wound #2 Left,Distal,Lateral Lower Leg: Increase protein intake. Home Health: Wound #2 Left,Distal,Lateral Lower Leg: [place] Nurse may visit PRN to address patient s wound care needs. KEIRON, IODICE ([id number]) FACE TO FACE ENCOUNTER: MEDICARE and MEDICAID PATIENTS: I certify that this patient is under my care and that I had a face-to-face encounter that meets the physician face-to-face encounter requirements with this patient on this date. The encounter with the patient was in whole or in part for the following MEDICAL CONDITION: (primary reason for [place]) MEDICAL NECESSITY: I certify, that based on my findings, NURSING services are a medically necessary home health service. HOME BOUND STATUS: I certify that my clinical findings support that this patient is homebound (i.e., Due  to illness or injury, pt requires aid of supportive devices such as crutches, cane, wheelchairs, walkers, the use of special transportation or the assistance of another person to leave their place of residence. There is a normal inability to leave the home and doing so requires considerable and taxing effort. Other absences are for medical reasons / religious services and are infrequent or of short duration when for other reasons). If current dressing causes regression in wound condition, may D/C ordered dressing product/s and apply Normal Saline Moist Dressing daily until next [place] / Other MD appointment. [place] of regression in wound condition at [phone]. Please direct any NON-WOUND related  issues/requests for orders to patient's Primary Care Physician I'm gonna suggest at this point that we continue with the above wound care measures for the next week. The patient is in agreement with the plan. Again no sharp debridement was required today which is excellent news we will see were things stand at follow-up. Please see above for specific wound care orders. We will see patient for re-evaluation in 2 week(s) here in the clinic. If anything worsens or changes patient will contact our office for additional recommendations. Electronic Signature(s) Signed: 01/03/2018 9:18:02 AM By: Worthy Keeler PA-C Entered By: Worthy Keeler on 01/02/2018 16:53:08 Linhart, Gavin Pound ([id number]) -------------------------------------------------------------------------------- ROS/PFSH Details Patient Name: [name] T. Date of Service: 01/02/2018 11:15 AM Medical Record Number: [id number] Patient Account Number: [id number] Date of Birth/Sex: [birth year] (84 y.o. M) Treating RN: Roger Shelter Primary Care Provider: Webb Silversmith Other Clinician: Referring Provider: Webb Silversmith Treating Provider/Extender: Melburn Hake, Garritt Molyneux Weeks in Treatment: 3 Information  Obtained From Patient Wound History Do you currently have one or more open woundso Yes How many open wounds do you currently haveo 1 Approximately how long have you had your woundso 2 months How have you been treating your wound(s) until nowo silver alginate Has your wound(s) ever healed and then re-openedo No Have you had any lab work done in the past montho Yes Who ordered the lab work doneo PCP Have you tested positive for an antibiotic resistant organism (MRSA, VRE)o No Have you tested positive for osteomyelitis (bone infection)o No Have you had any tests for circulation on your legso No Have you had other problems associated with your woundso Infection, Swelling Constitutional Symptoms (General Health) Complaints and Symptoms: Negative for: Fever; Chills Eyes Medical History: Negative for: Cataracts; Glaucoma; Optic Neuritis Ear/Nose/Mouth/Throat Medical History: Negative for: Chronic sinus problems/congestion; Middle ear problems Hematologic/Lymphatic Medical History: Negative for: Anemia; Hemophilia; Human Immunodeficiency Virus; Lymphedema; Sickle Cell Disease Respiratory Complaints and Symptoms: No Complaints or Symptoms Medical History: Positive for: Chronic Obstructive Pulmonary Disease (COPD); Sleep Apnea - CPAP Negative for: Aspiration; Asthma; Pneumothorax; Tuberculosis Cardiovascular Complaints and Symptoms: No Complaints or Symptoms Mednick, Lemont T. ([id number]) Medical History: Positive for: Arrhythmia - a fib; Congestive Heart Failure; Hypertension; Myocardial Infarction - 1997 Negative for: Angina; Coronary Artery Disease; Deep Vein Thrombosis; Hypotension; Peripheral Arterial Disease; Peripheral Venous Disease; Phlebitis; Vasculitis Gastrointestinal Medical History: Negative for: Cirrhosis ; Colitis; Crohnos; Hepatitis A; Hepatitis B; Hepatitis C Endocrine Medical History: Positive for: Type II Diabetes Negative for: Type I Diabetes Treated with:  Insulin, Oral agents Blood sugar tested every day: Yes Tested : QD Genitourinary Medical History: Negative for: End Stage Renal Disease Immunological Medical History: Negative for: Lupus Erythematosus; Raynaudos; Scleroderma Integumentary (Skin) Medical History: Negative for: History of Burn; History of pressure wounds Musculoskeletal Medical History: Positive for: Osteoarthritis Negative for: Gout; Rheumatoid Arthritis; Osteomyelitis Neurologic Medical History: Positive for: Neuropathy Negative for: Dementia; Quadriplegia; Paraplegia; Seizure Disorder Past Medical History Notes: CVA in [place] History: Positive for: Received Radiation - 5 treatments in chest Negative for: Received Chemotherapy Psychiatric Complaints and Symptoms: No Complaints or Symptoms Medical History: KEONTRE, DEFINO ([id number]) Negative for: Anorexia/bulimia; Confinement Anxiety Immunizations Pneumococcal Vaccine: Received Pneumococcal Vaccination: Yes Implantable Devices Family and Social History Cancer: No; Diabetes: Yes - Maternal Grandparents; Heart Disease: Yes - Paternal Grandparents; Hereditary Spherocytosis: No; Hypertension: Yes - Paternal Grandparents; Kidney Disease: No; Lung Disease: Yes - Mother; Seizures: No; Stroke: No; Thyroid Problems: No; Tuberculosis: No; Former smoker - quit in 1993; Marital Status -  Married; Alcohol Use: Never; Drug Use: No History; Caffeine Use: Daily; Financial Concerns: No; Food, Clothing or Shelter Needs: No; Support System Lacking: No; Transportation Concerns: No; Advanced Directives: No; Patient does not want information on Advanced Directives Physician Affirmation I have reviewed and agree with the above information. Electronic Signature(s) Signed: 01/03/2018 9:18:02 AM By: Worthy Keeler PA-C Signed: 01/03/2018 10:33:34 AM By: Roger Shelter Entered By: Worthy Keeler on 01/02/2018 16:52:35 Naeve, Gavin Pound  ([id number]) -------------------------------------------------------------------------------- SuperBill Details Patient Name: [name] T. Date of Service: 01/02/2018 Medical Record Number: [id number] Patient Account Number: [id number] Date of Birth/Sex: Jan 26, 1932 (84 y.o. M) Treating RN: Roger Shelter Primary Care Provider: Webb Silversmith Other Clinician: Referring Provider: Webb Silversmith Treating Provider/Extender: Melburn Hake, Shavonna Corella Weeks in Treatment: 3 Diagnosis Coding ICD-10 Codes Code Description I89.0 Lymphedema, not elsewhere classified L97.822 Non-pressure chronic ulcer of other part of left lower leg with fat layer exposed E11.622 Type 2 diabetes mellitus with other skin ulcer Z79.01 Long term (current) use of anticoagulants I50.42 Chronic combined systolic (congestive) and diastolic (congestive) heart failure J44.9 Chronic obstructive pulmonary disease, unspecified Facility Procedures CPT4 Code: [id number] Description: 40347 - WOUND CARE VISIT-LEV 2 EST PT Modifier: Quantity: 1 Physician Procedures CPT4 Code Description: [id number] - WC PHYS LEVEL 3 - EST PT ICD-10 Diagnosis Description I89.0 Lymphedema, not elsewhere classified L97.822 Non-pressure chronic ulcer of other part of left lower leg wit E11.622 Type 2 diabetes mellitus with other  skin ulcer Z79.01 Long term (current) use of anticoagulants Modifier: h fat layer expose Quantity: 1 d Electronic Signature(s) Signed: 01/03/2018 9:18:02 AM By: Worthy Keeler PA-C Entered By: Worthy Keeler on 01/02/2018 16:53:25

## 2018-01-05 ENCOUNTER — Ambulatory Visit (INDEPENDENT_AMBULATORY_CARE_PROVIDER_SITE_OTHER): Payer: Medicare Other | Admitting: Family Medicine

## 2018-01-05 ENCOUNTER — Encounter: Payer: Self-pay | Admitting: Family Medicine

## 2018-01-05 VITALS — BP 120/78 | HR 65 | Temp 97.6°F | Ht 66.0 in | Wt 188.5 lb

## 2018-01-05 DIAGNOSIS — M7989 Other specified soft tissue disorders: Secondary | ICD-10-CM

## 2018-01-05 DIAGNOSIS — E1121 Type 2 diabetes mellitus with diabetic nephropathy: Secondary | ICD-10-CM | POA: Diagnosis not present

## 2018-01-05 DIAGNOSIS — L03114 Cellulitis of left upper limb: Secondary | ICD-10-CM

## 2018-01-05 DIAGNOSIS — M1712 Unilateral primary osteoarthritis, left knee: Secondary | ICD-10-CM | POA: Diagnosis not present

## 2018-01-05 MED ORDER — DICLOFENAC SODIUM 1 % TD GEL: 4.0000 g | Freq: Four times a day (QID) | TRANSDERMAL | 5 refills | Status: DC

## 2018-01-05 NOTE — Progress Notes (Signed)
Dr. Frederico Hamman T. Draxton Luu, MD, [facility] [place], [address] Phone: [phone] Fax: [phone]  01/05/2018  Patient: Shawn Traywick., MRN: [id number], DOB: Jun 27, 1931, 84 y.o.  Primary Physician:  Jearld Fenton, NP   Chief Complaint  Patient presents with  . Knee Pain    Left   Subjective:   Shawn Stineman. is a 84 y.o. very pleasant male patient who presents with the following:  84 yo on Eliquis with L knee OA:  Complicated medical patient who recently was in the hospital for 6 days of IV antibiotics for significant upper extremity cellulitis.  He also has an open wound on his left lower extremity and is being followed currently by wound care.  He had an Haematologist very recently in this region.  He also has some pain in his left knee, this is better today compared to yesterday.    He does not have an effusion, is not red or warm.  Past Medical History, Surgical History, Social History, Family History, Problem List, Medications, and Allergies have been reviewed and updated if relevant.  Patient Active Problem List   Diagnosis Date Noted  . Cellulitis of arm, left 12/22/2017  . Cellulitis of left lower extremity 11/22/2017  . Left leg swelling 11/18/2017  . Rash and nonspecific skin eruption 11/18/2017  . Acquired hypothyroidism 11/22/2016  . Osteoarthritis, knee 11/22/2016  . Diabetes type 2, controlled ([place]) 06/13/2015  . BPH (benign prostatic hyperplasia) 05/14/2014  . Anxiety 05/14/2014  . Chronic obstructive pulmonary disease ([place]) 05/14/2014  . Chronic diastolic CHF (congestive heart failure) ([place])   . Insomnia 08/07/2013  . Hyperlipidemia 02/26/2009  . MITRAL REGURGITATION 02/26/2009  . Essential hypertension 02/26/2009  . MI 02/26/2009  . Coronary artery disease of native artery of native heart with stable angina pectoris ([place]) 02/26/2009  . Chronic atrial fibrillation ([place]) 02/26/2009  .  CVA (cerebral vascular accident) ([place]) 02/26/2009    Past Medical History:  Diagnosis Date  . Arthritis   . CAD    a. MI 01/29/1996 tx'd w/ TPA @ [facility]; b. Myoview 06/2005: EF 50%, scar @ apex, mild peri-infarct ischemia  . Cancer ([place])    skin  . Chronic atrial fibrillation ([place])    a. since 2006; b. on warfarin  . Chronic diastolic CHF (congestive heart failure) ([place])    a. echo 04/2006: EF lower limits of nl, mod LVH, mild aortic root dilatation, & mild MR, biatrial enlargement; b. echo 04/2013: EF 60%, mod dilated LA, mild MR & TR, mod pulm HTN w/ RV systolic pressure 53, c. echo 04/21/14: EF 55-60%, unable to exclude WMA, severely dilated LA 6.6 cm, nl RVSP, mildly dilated aortic root  . COPD (chronic obstructive pulmonary disease) (HCC)    oxygen prn at home  . CVA [id number],[id number]   x2  . DM   . Falls   . GERD (gastroesophageal reflux disease)   . History of kidney stones   . HYPERLIPIDEMIA   . HYPERTENSION   . Kidney stone    a. s/p left ureteral stenting 04/24/14  . Left arm weakness    limited movement. S/P fall injury  . Neuropathy of both feet   . Poor balance   . Wears dentures    full upper and lower    Past Surgical History:  Procedure Laterality Date  . BLADDER SURGERY     stent placement   . CARDIAC CATHETERIZATION  1997   [facility]  . CAROTID STENT INSERTION  1997  . CATARACT EXTRACTION W/PHACO Left 10/11/2017   Procedure: CATARACT EXTRACTION PHACO AND INTRAOCULAR LENS PLACEMENT ([place]) COMPLICATED LEFT DIABETIC;  Surgeon: Leandrew Koyanagi, MD;  Location: [facility];  Service: Ophthalmology;  Laterality: Left;  MALYUGIN Diabetic - insulin  . CATARACT EXTRACTION W/PHACO Right 11/30/2017   Procedure: CATARACT EXTRACTION PHACO AND INTRAOCULAR LENS PLACEMENT ([place]) COMPLICATED  RIGHT DIABETIC;  Surgeon: Leandrew Koyanagi, MD;  Location: [facility];  Service: Ophthalmology;  Laterality: Right;  Diabetic - insulin  . CIRCUMCISION  2016  . CORONARY  ANGIOPLASTY  1997   s/p stent placement x 2   . CYSTOSCOPY W/ URETERAL STENT REMOVAL Left 10/09/2014   Procedure: CYSTOSCOPY WITH STENT REMOVAL;  Surgeon: Hollice Espy, MD;  Location: [facility];  Service: Urology;  Laterality: Left;  . CYSTOSCOPY WITH STENT PLACEMENT Left 10/09/2014   Procedure: CYSTOSCOPY WITH STENT PLACEMENT;  Surgeon: Hollice Espy, MD;  Location: [facility];  Service: Urology;  Laterality: Left;  . KIDNEY SURGERY  05/2013   s/p stent placement   . stents ureters Bilateral   . TONSILLECTOMY AND ADENOIDECTOMY  1959  . URETEROSCOPY WITH HOLMIUM LASER LITHOTRIPSY Left 10/09/2014   Procedure: URETEROSCOPY WITH HOLMIUM LASER LITHOTRIPSY;  Surgeon: Hollice Espy, MD;  Location: [facility];  Service: Urology;  Laterality: Left;    Social History   Socioeconomic History  . Marital status: Married    Spouse name: Not on file  . Number of children: Not on file  . Years of education: Not on file  . Highest education level: Not on file  Occupational History  . Not on file  Social Needs  . Financial resource strain: Not on file  . Food insecurity:    Worry: Not on file    Inability: Not on file  . Transportation needs:    Medical: Not on file    Non-medical: Not on file  Tobacco Use  . Smoking status: Former Smoker    Packs/day: 2.00    Years: 40.00    Pack years: 80.00    Types: Cigarettes    Last attempt to quit: 05/24/1990    Years since quitting: 27.6  . Smokeless tobacco: Former Systems developer    Quit date: 05/24/1990  Substance and Sexual Activity  . Alcohol use: No  . Drug use: No  . Sexual activity: Not Currently  Lifestyle  . Physical activity:    Days per week: Not on file    Minutes per session: Not on file  . Stress: Not on file  Relationships  . Social connections:    Talks on phone: Not on file    Gets together: Not on file    Attends religious service: Not on file    Active member of club or organization: Not on file    Attends meetings of clubs or  organizations: Not on file    Relationship status: Not on file  . Intimate partner violence:    Fear of current or ex partner: Not on file    Emotionally abused: Not on file    Physically abused: Not on file    Forced sexual activity: Not on file  Other Topics Concern  . Not on file  Social History Narrative  . Not on file    Family History  Problem Relation Age of Onset  . Heart disease Mother   . Heart disease Maternal Grandmother   . Diabetes Maternal Grandmother   . Cancer  Neg Hx   . Stroke Neg Hx     Allergies  Allergen Reactions  . Contrast Media [Iodinated Diagnostic Agents] Shortness Of Breath  . Morphine And Related Other (See Comments)    Hallucinations   . Niacin And Related Dermatitis  . Other     Other reaction(s): SHORTNESS OF BREATH  . Amlodipine Rash    Medication list reviewed and updated in full in [facility].  GEN: No fevers, chills. Nontoxic. Primarily MSK c/o today. MSK: Detailed in the HPI GI: tolerating PO intake without difficulty Neuro: No numbness, parasthesias, or tingling associated. Otherwise the pertinent positives of the ROS are noted above.   Objective:   BP 120/78   Pulse 65   Temp 97.6 F (36.4 C) (Oral)   Ht 5\' 6"  (1.676 m)   Wt 188 lb 8 oz (85.5 kg)   BMI 30.42 kg/m    GEN: WDWN, NAD, Non-toxic, Alert & Oriented x 3 HEENT: Atraumatic, Normocephalic.  Ears and Nose: No external deformity. EXTR: No clubbing/cyanosis/tr - 1 + LE Edema NEURO: Normal gait. antalgia PSYCH: Normally interactive. Conversant. Not depressed or anxious appearing.  Calm demeanor.    Left knee, no effusion.  No warmth.  Patient has medial joint line tenderness greater than the lateral joint line tenderness.  ACL and PCL appear intact.  MCL and LCL are intact.  Does have pain with flexion pinch or McMurray's.  He also has pain with bounce home testing.  Bony anatomy is nontender.  Radiology: Dg Knee 1-2 Views Left  Result Date:  12/25/2017 CLINICAL DATA:  Left knee pain, no known injury, initial encounter EXAM: LEFT KNEE - 2 VIEW COMPARISON:  None. FINDINGS: Mild medial joint space narrowing is noted. Small joint effusion is seen. Diffuse vascular calcifications are noted. IMPRESSION: Mild degenerative change with joint effusion. Electronically Signed   By: Inez Catalina M.D.   On: 12/25/2017 15:57   US Venous Img Upper Uni Left  Result Date: 12/21/2017 CLINICAL DATA:  Left upper extremity swelling and pain for 4 days EXAM: LEFT UPPER EXTREMITY VENOUS DUPLEX ULTRASOUND TECHNIQUE: Doppler venous assessment of the left upper extremity deep venous system was performed, including characterization of spectral flow, compressibility, and phasicity. COMPARISON:  None. FINDINGS: Left jugular vein is compressible. Color Doppler imaging demonstrates patency. Doppler analysis demonstrates a phasic venous waveform. Color Doppler imaging of the left subclavian vein demonstrates patency. Doppler analysis demonstrates a phasic venous waveform. There is complete compressibility of the left axillary, brachial, radial, and ulnar veins. Doppler analysis demonstrates phasicity. No evidence of superficial vein thrombosis. IMPRESSION: No evidence of left upper extremity DVT. Electronically Signed   By: Marybelle Killings M.D.   On: 12/21/2017 21:28    Assessment and Plan:   Primary osteoarthritis of left knee  Cellulitis of arm, left  Controlled type 2 diabetes mellitus with diabetic nephropathy, without long-term current use of insulin (HCC)  Left leg swelling  Complicated medical patient, recent hospitalization for cellulitis requiring 6 days of IV antibiotics, as well as recent surgery for cataracts, and ongoing wound care management.  Continue conservative care alone, I am going to give him some Voltaren gel that he can try to see if this helps with his symptoms.  If he becomes healthier, and his knee pain is worsened, then we could do  additional intervention such as corticosteroid or Visco supplementation injection, but at this time, potential risks seem to outweigh the potential benefit.  Follow-up: prn  Meds ordered this encounter  Medications  . diclofenac sodium (VOLTAREN) 1 % GEL    Sig: Apply 4 g topically 4 (four) times daily.    Dispense:  5 Tube    Refill:  5   Signed,  Michaeline Eckersley T. Esiah Bazinet, MD   Allergies as of 01/05/2018      Reactions   Contrast Media [iodinated Diagnostic Agents] Shortness Of Breath   Morphine And Related Other (See Comments)   Hallucinations   Niacin And Related Dermatitis   Other    Other reaction(s): SHORTNESS OF BREATH   Amlodipine Rash      Medication List        Accurate as of 01/05/18  1:59 PM. Always use your most recent med list.          acetaminophen 650 MG CR tablet Commonly known as:  TYLENOL Take 650 mg by mouth every 8 (eight) hours as needed for pain.   albuterol 108 (90 Base) MCG/ACT inhaler Commonly known as:  PROVENTIL HFA;VENTOLIN HFA USE 1 TO 2 INHALATIONS EVERY 4 HOURS AS NEEDED   ALPRAZolam 0.25 MG tablet Commonly known as:  XANAX Take 1 tablet (0.25 mg total) by mouth at bedtime as needed for anxiety.   apixaban 2.5 MG Tabs tablet Commonly known as:  ELIQUIS Take 1 tablet (2.5 mg total) by mouth 2 (two) times daily.   atorvastatin 40 MG tablet Commonly known as:  LIPITOR Take 1 tablet (40 mg total) by mouth daily.   citalopram 10 MG tablet Commonly known as:  CELEXA TAKE 1 TABLET DAILY   clotrimazole 1 % cream Commonly known as:  LOTRIMIN Apply 1 application topically 2 (two) times daily.   diclofenac sodium 1 % Gel Commonly known as:  VOLTAREN Apply 4 g topically 4 (four) times daily.   dorzolamide 2 % ophthalmic solution Commonly known as:  TRUSOPT Place 1 drop into both eyes 3 (three) times daily.   ezetimibe 10 MG tablet Commonly known as:  ZETIA Take 1 tablet (10 mg total) by mouth daily.   finasteride 5 MG  tablet Commonly known as:  PROSCAR TAKE 1 TABLET DAILY   gabapentin 100 MG capsule Commonly known as:  NEURONTIN TAKE 1 CAPSULE THREE TIMES A DAY   glipiZIDE 5 MG tablet Commonly known as:  GLUCOTROL TAKE 1 TABLET TWICE A DAY BEFORE MEALS   HUMALOG KWIKPEN 100 UNIT/ML KiwkPen Generic drug:  insulin lispro INJECT 18 UNITS WITH BREAKFAST, 20 UNITS WITH LUNCH, AND 16 UNITS WITH DINNER   Insulin Pen Needle 32G X 4 MM Misc USE THREE TIMES A DAY FOR INSULIN ADMINISTRATION.   Insulin Syringe-Needle U-100 30G X 1/2" 1 ML Misc 1 each by Does not apply route 3 (three) times daily.   JANUVIA 100 MG tablet Generic drug:  sitaGLIPtin TAKE 1 TABLET DAILY   latanoprost 0.005 % ophthalmic solution Commonly known as:  XALATAN Place 1 drop into both eyes at bedtime.   LEVEMIR FLEXTOUCH 100 UNIT/ML Pen Generic drug:  Insulin Detemir INJECT 42 UNITS UNDER THE SKIN DAILY AT 10 P.M.   levothyroxine 50 MCG tablet Commonly known as:  SYNTHROID, LEVOTHROID TAKE 1 TABLET DAILY BEFORE BREAKFAST   metolazone 5 MG tablet Commonly known as:  ZAROXOLYN TAKE 1 TABLET DAILY AS NEEDED   metoprolol succinate 50 MG 24 hr tablet Commonly known as:  TOPROL-XL TAKE 1 TABLET TWICE A DAY   nitroGLYCERIN 0.4 MG SL tablet Commonly known as:  NITROSTAT Place 0.4 mg under the tongue every 5 (five) minutes as needed.  nystatin-triamcinolone cream Commonly known as:  MYCOLOG II Apply 1 application topically 2 (two) times daily.   potassium chloride SA 20 MEQ tablet Commonly known as:  K-DUR,KLOR-CON Take 3 tablets (60 mEq total) by mouth 2 (two) times daily.   predniSONE 10 MG tablet Commonly known as:  DELTASONE TAKE 1 TABLET DAILY WITH BREAKFAST   SYMBICORT 160-4.5 MCG/ACT inhaler Generic drug:  budesonide-formoterol USE 2 INHALATIONS TWICE A DAY   tamsulosin 0.4 MG Caps capsule Commonly known as:  FLOMAX TAKE 1 CAPSULE DAILY   tiotropium 18 MCG inhalation capsule Commonly known as:   SPIRIVA Place 1 capsule (18 mcg total) daily into inhaler and inhale.   torsemide 20 MG tablet Commonly known as:  DEMADEX Take 40 mg by mouth 2 (two) times daily.   traMADol 50 MG tablet Commonly known as:  ULTRAM Take 1 tablet (50 mg total) by mouth every 8 (eight) hours as needed.

## 2018-01-06 NOTE — Progress Notes (Signed)
NILAN, IDDINGS ([id number]) Visit Report for 01/02/2018 Arrival Information Details Patient Name: [name], [name]. Date of Service: 01/02/2018 11:15 AM Medical Record Number: [id number] Patient Account Number: [id number] Date of Birth/Sex: [birth year] (84 y.o. M) Treating RN: Roger Shelter Primary Care Saharsh Sterling: Webb Silversmith Other Clinician: Referring Hira Trent: Webb Silversmith Treating Garl Speigner/Extender: Melburn Hake, HOYT Weeks in Treatment: 3 Visit Information History Since Last Visit Added or deleted any medications: Yes Patient Arrived: Gilford Rile Had a fall or experienced change in No Arrival Time: 11:13 activities of daily living that may affect Accompanied By: daughter risk of falls: Transfer Assistance: None Signs or symptoms of abuse/neglect since last visito No Patient Identification Verified: Yes Hospitalized since last visit: Yes Secondary Verification Process Yes Implantable device outside of the clinic excluding No Completed: cellular tissue based products placed in the center Patient Has Alerts: Yes since last visit: Patient Alerts: Patient on Blood Pain Present Now: No Thinner Eliquis DMII ABI Nicholson BILATERAL Electronic Signature(s) Signed: 01/02/2018 11:43:05 AM By: Lorine Bears RCP, RRT, CHT Entered By: Becky Sax, Amado Nash on 01/02/2018 11:15:43 [place], [name] ([id number]) -------------------------------------------------------------------------------- Clinic Level of Care Assessment Details Patient Name: Shawn Bark [name]. Date of Service: 01/02/2018 11:15 AM Medical Record Number: [id number] Patient Account Number: [id number] Date of Birth/Sex: [birth year] (84 y.o. M) Treating RN: Roger Shelter Primary Care Brynne Doane: Webb Silversmith Other Clinician: Referring Letrell Attwood: Webb Silversmith Treating Wolfgang Finigan/Extender: Melburn Hake, HOYT Weeks in Treatment: 3 Clinic Level of Care Assessment Items TOOL 4 Quantity Score X - Use when only an  EandM is performed on FOLLOW-UP visit 1 0 ASSESSMENTS - Nursing Assessment / Reassessment []  - Reassessment of Co-morbidities (includes updates in patient status) 0 X- 1 5 Reassessment of Adherence to Treatment Plan ASSESSMENTS - Wound and Skin Assessment / Reassessment X - Simple Wound Assessment / Reassessment - one wound 1 5 []  - 0 Complex Wound Assessment / Reassessment - multiple wounds []  - 0 Dermatologic / Skin Assessment (not related to wound area) ASSESSMENTS - Focused Assessment []  - Circumferential Edema Measurements - multi extremities 0 []  - 0 Nutritional Assessment / Counseling / Intervention []  - 0 Lower Extremity Assessment (monofilament, tuning fork, pulses) []  - 0 Peripheral Arterial Disease Assessment (using hand held doppler) ASSESSMENTS - Ostomy and/or Continence Assessment and Care []  - Incontinence Assessment and Management 0 []  - 0 Ostomy Care Assessment and Management (repouching, etc.) PROCESS - Coordination of Care X - Simple Patient / Family Education for ongoing care 1 15 []  - 0 Complex (extensive) Patient / Family Education for ongoing care []  - 0 Staff obtains Programmer, systems, Records, Test Results / Process Orders []  - 0 Staff telephones HHA, Nursing Homes / Clarify orders / etc []  - 0 Routine Transfer to another Facility (non-emergent condition) []  - 0 Routine Hospital Admission (non-emergent condition) []  - 0 New Admissions / Biomedical engineer / Ordering NPWT, Apligraf, etc. []  - 0 Emergency Hospital Admission (emergent condition) X- 1 10 Simple Discharge Coordination [name], [name]. ([id number]) []  - 0 Complex (extensive) Discharge Coordination PROCESS - Special Needs []  - Pediatric / Minor Patient Management 0 []  - 0 Isolation Patient Management []  - 0 Hearing / Language / Visual special needs []  - 0 Assessment of Community assistance (transportation, D/C planning, etc.) []  - 0 Additional assistance / Altered mentation []  -  0 Support Surface(s) Assessment (bed, cushion, seat, etc.) INTERVENTIONS - Wound Cleansing / Measurement X - Simple Wound Cleansing - one wound 1 5 []  - 0 Complex Wound Cleansing - multiple  wounds X- 1 5 Wound Imaging (photographs - any number of wounds) []  - 0 Wound Tracing (instead of photographs) X- 1 5 Simple Wound Measurement - one wound []  - 0 Complex Wound Measurement - multiple wounds INTERVENTIONS - Wound Dressings X - Small Wound Dressing one or multiple wounds 1 10 []  - 0 Medium Wound Dressing one or multiple wounds []  - 0 Large Wound Dressing one or multiple wounds []  - 0 Application of Medications - topical []  - 0 Application of Medications - injection INTERVENTIONS - Miscellaneous []  - External ear exam 0 []  - 0 Specimen Collection (cultures, biopsies, blood, body fluids, etc.) []  - 0 Specimen(s) / Culture(s) sent or taken to Lab for analysis []  - 0 Patient Transfer (multiple staff / Civil Service fast streamer / Similar devices) []  - 0 Simple Staple / Suture removal (25 or less) []  - 0 Complex Staple / Suture removal (26 or more) []  - 0 Hypo / Hyperglycemic Management (close monitor of Blood Glucose) []  - 0 Ankle / Brachial Index (ABI) - do not check if billed separately X- 1 5 Vital Signs [name], [name]. ([id number]) Has the patient been seen at the hospital within the last three years: Yes Total Score: 65 Level Of Care: New/Established - Level 2 Electronic Signature(s) Signed: 01/03/2018 10:33:34 AM By: Roger Shelter Entered By: Roger Shelter on 01/02/2018 11:47:31 [name], [name] ([id number]) -------------------------------------------------------------------------------- Lower Extremity Assessment Details Patient Name: Shawn Bark [name]. Date of Service: 01/02/2018 11:15 AM Medical Record Number: [id number] Patient Account Number: [id number] Date of Birth/Sex: Mar 29, 1932 (84 y.o. M) Treating RN: Secundino Ginger Primary Care Remer Couse: Webb Silversmith Other  Clinician: Referring Gayland Nicol: Webb Silversmith Treating Charvez Voorhies/Extender: Melburn Hake, HOYT Weeks in Treatment: 3 Edema Assessment Assessed: [Left: No] [Right: No] [Left: Edema] [Right: :] Calf Left: Right: Point of Measurement: 36 cm From Medial Instep 35 cm cm Ankle Left: Right: Point of Measurement: 12 cm From Medial Instep 22 cm cm Vascular Assessment Claudication: Claudication Assessment [Left:None] Pulses: Dorsalis Pedis Palpable: [Left:Yes] Posterior Tibial Extremity colors, hair growth, and conditions: Extremity Color: [Left:Normal] Hair Growth on Extremity: [Left:No] Temperature of Extremity: [Left:Cool] Capillary Refill: [Left:< 3 seconds] Toe Nail Assessment Left: Right: Thick: Yes Discolored: Yes Deformed: Yes Improper Length and Hygiene: No Electronic Signature(s) Signed: 01/05/2018 8:43:30 AM By: Secundino Ginger Entered By: Secundino Ginger on 01/02/2018 11:28:10 [name], [name] ([id number]) -------------------------------------------------------------------------------- Multi Wound Chart Details Patient Name: Shawn Bark [name]. Date of Service: 01/02/2018 11:15 AM Medical Record Number: [id number] Patient Account Number: [id number] Date of Birth/Sex: [birth year] (84 y.o. M) Treating RN: Roger Shelter Primary Care Rhydian Baldi: Webb Silversmith Other Clinician: Referring [place] Kuang: Webb Silversmith Treating Corrado Hymon/Extender: STONE III, HOYT Weeks in Treatment: 3 Vital Signs Height(in): 18 Pulse(bpm): 58 Weight(lbs): 182 Blood Pressure(mmHg): 115/58 Body Mass Index(BMI): 29 Temperature(F): 97.8 Respiratory Rate 18 (breaths/min): Photos: [N/A:N/A] Wound Location: Left Lower Leg - Lateral, Left Lower Leg - Lateral, Distal N/A Proximal Wounding Event: Gradually Appeared Gradually Appeared N/A Primary Etiology: Diabetic Wound/Ulcer of the Diabetic Wound/Ulcer of the N/A Lower Extremity Lower Extremity Comorbid History: Chronic Obstructive Chronic Obstructive  N/A Pulmonary Disease (COPD), Pulmonary Disease (COPD), Sleep Apnea, Arrhythmia, Sleep Apnea, Arrhythmia, Congestive Heart Failure, Congestive Heart Failure, Hypertension, Myocardial Hypertension, Myocardial Infarction, Type II Diabetes, Infarction, Type II Diabetes, Osteoarthritis, Neuropathy, Osteoarthritis, Neuropathy, Received Radiation Received Radiation Date Acquired: 09/05/2017 09/05/2017 N/A Weeks of Treatment: 3 3 N/A Wound Status: Open Open N/A Clustered Wound: Yes No N/A Clustered Quantity: 2 N/A N/A Measurements L x W x D 0.1x0.1x0.1 0.1x0.1x0.1 N/A (  cm) Area (cm) : 0.008 0.008 N/A Volume (cm) : 0.001 0.001 N/A % Reduction in Area: 99.90% 99.40% N/A % Reduction in Volume: 99.90% 99.30% N/A Classification: Grade 1 Grade 1 N/A Exudate Amount: None Present None Present N/A Wound Margin: Flat and Intact Flat and Intact N/A Granulation Amount: None Present (0%) None Present (0%) N/A Necrotic Amount: None Present (0%) None Present (0%) N/A Acero, Daaron [name]. ([id number]) Exposed Structures: Fat Layer (Subcutaneous Fascia: No N/A Tissue) Exposed: Yes Fat Layer (Subcutaneous Fascia: No Tissue) Exposed: No Tendon: No Tendon: No Muscle: No Muscle: No Joint: No Joint: No Bone: No Bone: No Epithelialization: None None N/A Periwound Skin Texture: Excoriation: No No Abnormalities Noted N/A Induration: No Callus: No Crepitus: No Rash: No Scarring: No Periwound Skin Moisture: Maceration: No Maceration: No N/A Dry/Scaly: No Dry/Scaly: No Periwound Skin Color: Hemosiderin Staining: Yes Erythema: Yes N/A Atrophie Blanche: No Cyanosis: No Ecchymosis: No Erythema: No Mottled: No Pallor: No Rubor: No Temperature: No Abnormality No Abnormality N/A Tenderness on Palpation: No No N/A Wound Preparation: Ulcer Cleansing: Ulcer Cleansing: N/A Rinsed/Irrigated with Saline Rinsed/Irrigated with Saline Topical Anesthetic Applied: Topical Anesthetic Applied: Other:  lidocaine 4% Other: lidocaine 4% Treatment Notes Electronic Signature(s) Signed: 01/03/2018 10:33:34 AM By: Roger Shelter Entered By: Roger Shelter on 01/02/2018 11:42:43 [name], [name] ([id number]) -------------------------------------------------------------------------------- Multi-Disciplinary Care Plan Details Patient Name: Shawn Bark [name]. Date of Service: 01/02/2018 11:15 AM Medical Record Number: [id number] Patient Account Number: [id number] Date of Birth/Sex: [birth year] (84 y.o. M) Treating RN: Roger Shelter Primary Care Colena Ketterman: Webb Silversmith Other Clinician: Referring Naina Sleeper: Webb Silversmith Treating Ethin Drummond/Extender: Melburn Hake, HOYT Weeks in Treatment: 3 Active Inactive ` Abuse / Safety / Falls / Self Care Management Nursing Diagnoses: Potential for falls Goals: Patient will not experience any injury related to falls Date Initiated: 12/06/2017 Target Resolution Date: 03/18/2018 Goal Status: Active Interventions: Assess Activities of Daily Living upon admission and as needed Assess fall risk on admission and as needed Assess: immobility, friction, shearing, incontinence upon admission and as needed Assess impairment of mobility on admission and as needed per policy Assess personal safety and home safety (as indicated) on admission and as needed Assess self care needs on admission and as needed Notes: ` Nutrition Nursing Diagnoses: Imbalanced nutrition Impaired glucose control: actual or potential Potential for alteratiion in Nutrition/Potential for imbalanced nutrition Goals: Patient/caregiver agrees to and verbalizes understanding of need to use nutritional supplements and/or vitamins as prescribed Date Initiated: 12/06/2017 Target Resolution Date: 03/18/2018 Goal Status: Active Patient/caregiver will maintain therapeutic glucose control Date Initiated: 12/06/2017 Target Resolution Date: 03/18/2018 Goal Status: Active Interventions: Assess patient  nutrition upon admission and as needed per policy Provide education on elevated blood sugars and impact on wound healing Provide education on nutrition [name], [name] ([id number]) Notes: ` Orientation to the Wound Care Program Nursing Diagnoses: Knowledge deficit related to the wound healing center program Goals: Patient/caregiver will verbalize understanding of the [place] Date Initiated: 12/06/2017 Target Resolution Date: 12/17/2017 Goal Status: Active Interventions: Provide education on orientation to the wound center Notes: ` Wound/Skin Impairment Nursing Diagnoses: Impaired tissue integrity Knowledge deficit related to ulceration/compromised skin integrity Goals: Ulcer/skin breakdown will have a volume reduction of 80% by week 12 Date Initiated: 12/06/2017 Target Resolution Date: 03/11/2018 Goal Status: Active Interventions: Assess patient/caregiver ability to perform ulcer/skin care regimen upon admission and as needed Assess ulceration(s) every visit Notes: Electronic Signature(s) Signed: 01/03/2018 10:33:34 AM By: Roger Shelter Entered By: Roger Shelter on 01/02/2018 11:42:37 [name], [name]. (  [id number]) -------------------------------------------------------------------------------- Pain Assessment Details Patient Name: [name], FREUND. Date of Service: 01/02/2018 11:15 AM Medical Record Number: [id number] Patient Account Number: [id number] Date of Birth/Sex: 26-Aug-1931 (85 y.o. M) Treating RN: Roger Shelter Primary Care Moria Brophy: Webb Silversmith Other Clinician: Referring Nasean Zapf: Webb Silversmith Treating Clytee Heinrich/Extender: Melburn Hake, HOYT Weeks in Treatment: 3 Active Problems Location of Pain Severity and Description of Pain Patient Has Paino No Site Locations Pain Management and Medication Current Pain Management: Electronic Signature(s) Signed: 01/02/2018 11:43:05 AM By: Lorine Bears RCP, RRT, CHT Signed:  01/03/2018 10:33:34 AM By: Roger Shelter Entered By: Lorine Bears on 01/02/2018 11:15:09 [name] ([id number]) -------------------------------------------------------------------------------- Wound Assessment Details Patient Name: Shawn Bark [name]. Date of Service: 01/02/2018 11:15 AM Medical Record Number: [id number] Patient Account Number: [id number] Date of Birth/Sex: [birth year] (84 y.o. M) Treating RN: Roger Shelter Primary Care Elliannah Wayment: Webb Silversmith Other Clinician: Referring Florina Glas: Webb Silversmith Treating Mariadel Mruk/Extender: STONE III, HOYT Weeks in Treatment: 3 Wound Status Wound Number: 1 Primary Diabetic Wound/Ulcer of the Lower Extremity Etiology: Wound Location: Left, Proximal, Lateral Lower Leg Wound Healed - Epithelialized Wounding Event: Gradually Appeared Status: Date Acquired: 09/05/2017 Comorbid Chronic Obstructive Pulmonary Disease (COPD), Weeks Of Treatment: 3 History: Sleep Apnea, Arrhythmia, Congestive Heart Clustered Wound: Yes Failure, Hypertension, Myocardial Infarction, Type II Diabetes, Osteoarthritis, Neuropathy, Received Radiation Photos Photo Uploaded By: Secundino Ginger on 01/02/2018 11:32:23 Wound Measurements Length: (cm) 0 % Re Width: (cm) 0 % Re Depth: (cm) 0 Epit Clustered Quantity: 2 Tunn Area: (cm) 0 Und Volume: (cm) 0 duction in Area: 100% duction in Volume: 100% helialization: None eling: No ermining: No Wound Description Classification: Grade 1 Foul Wound Margin: Flat and Intact Slou Exudate Amount: None Present Odor After Cleansing: No gh/Fibrino No Wound Bed Granulation Amount: None Present (0%) Exposed Structure Necrotic Amount: None Present (0%) Fascia Exposed: No Fat Layer (Subcutaneous Tissue) Exposed: Yes Tendon Exposed: No Muscle Exposed: No Joint Exposed: No Bone Exposed: No [name], [name]. ([id number]) Periwound Skin Texture Texture Color No Abnormalities Noted: No No  Abnormalities Noted: No Callus: No Atrophie Blanche: No Crepitus: No Cyanosis: No Excoriation: No Ecchymosis: No Induration: No Erythema: No Rash: No Hemosiderin Staining: Yes Scarring: No Mottled: No Pallor: No Moisture Rubor: No No Abnormalities Noted: No Dry / Scaly: No Temperature / Pain Maceration: No Temperature: No Abnormality Wound Preparation Ulcer Cleansing: Rinsed/Irrigated with Saline Topical Anesthetic Applied: Other: lidocaine 4%, Electronic Signature(s) Signed: 01/03/2018 10:33:34 AM By: Roger Shelter Entered By: Roger Shelter on 01/02/2018 11:44:13 [name], [name] ([id number]) -------------------------------------------------------------------------------- Wound Assessment Details Patient Name: Shawn Bark [name]. Date of Service: 01/02/2018 11:15 AM Medical Record Number: [id number] Patient Account Number: [id number] Date of Birth/Sex: Jun 23, 1931 (84 y.o. M) Treating RN: Secundino Ginger Primary Care Runette Scifres: Webb Silversmith Other Clinician: Referring Landis Dowdy: Webb Silversmith Treating Mizani Dilday/Extender: STONE III, HOYT Weeks in Treatment: 3 Wound Status Wound Number: 2 Primary Diabetic Wound/Ulcer of the Lower Extremity Etiology: Wound Location: Left Lower Leg - Lateral, Distal Wound Open Wounding Event: Gradually Appeared Status: Date Acquired: 09/05/2017 Comorbid Chronic Obstructive Pulmonary Disease (COPD), Weeks Of Treatment: 3 History: Sleep Apnea, Arrhythmia, Congestive Heart Clustered Wound: No Failure, Hypertension, Myocardial Infarction, Type II Diabetes, Osteoarthritis, Neuropathy, Received Radiation Photos Photo Uploaded By: Secundino Ginger on 01/02/2018 11:32:24 Wound Measurements Length: (cm) 0.1 % Reducti Width: (cm) 0.1 % Reducti Depth: (cm) 0.1 Epithelia Area: (cm) 0.008 Tunnelin Volume: (cm) 0.001 Undermin on in Area: 99.4% on in Volume: 99.3% lization: None g: No ing: No Wound Description Classification: Grade 1 Foul  Odor Wound Margin: Flat and Intact Slough/Fi Exudate Amount: None Present After Cleansing: No brino No Wound Bed Granulation Amount: None Present (0%) Exposed Structure Necrotic Amount: None Present (0%) Fascia Exposed: No Fat Layer (Subcutaneous Tissue) Exposed: No Tendon Exposed: No Muscle Exposed: No Joint Exposed: No Bone Exposed: No Periwound Skin Texture [name], [name]. ([id number]) Texture Color No Abnormalities Noted: Yes No Abnormalities Noted: No Erythema: Yes Moisture No Abnormalities Noted: No Temperature / Pain Dry / Scaly: No Temperature: No Abnormality Maceration: No Wound Preparation Ulcer Cleansing: Rinsed/Irrigated with Saline Topical Anesthetic Applied: Other: lidocaine 4%, Electronic Signature(s) Signed: 01/05/2018 8:43:30 AM By: Secundino Ginger Entered By: Secundino Ginger on 01/02/2018 11:25:54 [name] ([id number]) -------------------------------------------------------------------------------- Vitals Details Patient Name: Shawn Bark [name]. Date of Service: 01/02/2018 11:15 AM Medical Record Number: [id number] Patient Account Number: [id number] Date of Birth/Sex: [birth year] (84 y.o. M) Treating RN: Roger Shelter Primary Care Jazir Newey: Webb Silversmith Other Clinician: Referring Morine Kohlman: Webb Silversmith Treating Samina Weekes/Extender: STONE III, HOYT Weeks in Treatment: 3 Vital Signs Time Taken: 11:15 Temperature (F): 97.8 Height (in): 66 Pulse (bpm): 55 Weight (lbs): 182 Respiratory Rate (breaths/min): 18 Body Mass Index (BMI): 29.4 Blood Pressure (mmHg): 115/58 Reference Range: 80 - 120 mg / dl Electronic Signature(s) Signed: 01/02/2018 11:43:05 AM By: Lorine Bears RCP, RRT, CHT Entered By: Lorine Bears on 01/02/2018 11:18:09

## 2018-01-08 ENCOUNTER — Other Ambulatory Visit: Payer: Self-pay | Admitting: Internal Medicine

## 2018-01-09 ENCOUNTER — Telehealth: Payer: Self-pay | Admitting: *Deleted

## 2018-01-09 ENCOUNTER — Telehealth: Payer: Self-pay | Admitting: Internal Medicine

## 2018-01-09 NOTE — Telephone Encounter (Signed)
done

## 2018-01-09 NOTE — Telephone Encounter (Addendum)
Completed PA form faxed to [place] [phone].

## 2018-01-09 NOTE — Telephone Encounter (Signed)
Copied from [place] [phone]. Topic: Quick Communication - See Telephone Encounter >> Jan 09, 2018 11:09 AM Percell Belt A wrote: CRM for notification. See Telephone encounter for: 01/09/18. Estill Bamberg with [id number] Need verbals for or the ok to draw labs  Fyi pt may have some gout and possible cellulitis   She is need a call back to advise

## 2018-01-09 NOTE — Telephone Encounter (Signed)
If there is concern for gout or cellulitis, he needs to been seen in the office. Ok to draw labs.

## 2018-01-09 NOTE — Telephone Encounter (Signed)
Received Prior Authorization form for Diclofenac Gel.  Form placed in Dr. Lillie Fragmin in box to complete and sign.

## 2018-01-10 NOTE — Telephone Encounter (Signed)
PA approved effective 12/10/2017 through 01/09/2019.

## 2018-01-12 ENCOUNTER — Encounter: Payer: Self-pay | Admitting: Pulmonary Disease

## 2018-01-12 ENCOUNTER — Encounter: Payer: Self-pay | Admitting: Internal Medicine

## 2018-01-12 ENCOUNTER — Ambulatory Visit (INDEPENDENT_AMBULATORY_CARE_PROVIDER_SITE_OTHER): Payer: Medicare Other | Admitting: Pulmonary Disease

## 2018-01-12 VITALS — BP 104/62 | HR 77 | Resp 16 | Ht 66.0 in | Wt 188.0 lb

## 2018-01-12 DIAGNOSIS — G4733 Obstructive sleep apnea (adult) (pediatric): Secondary | ICD-10-CM

## 2018-01-12 DIAGNOSIS — J849 Interstitial pulmonary disease, unspecified: Secondary | ICD-10-CM | POA: Diagnosis not present

## 2018-01-12 DIAGNOSIS — R942 Abnormal results of pulmonary function studies: Secondary | ICD-10-CM | POA: Diagnosis not present

## 2018-01-12 DIAGNOSIS — G4734 Idiopathic sleep related nonobstructive alveolar hypoventilation: Secondary | ICD-10-CM

## 2018-01-12 DIAGNOSIS — J449 Chronic obstructive pulmonary disease, unspecified: Secondary | ICD-10-CM

## 2018-01-12 DIAGNOSIS — G4700 Insomnia, unspecified: Secondary | ICD-10-CM

## 2018-01-12 DIAGNOSIS — C3492 Malignant neoplasm of unspecified part of left bronchus or lung: Secondary | ICD-10-CM

## 2018-01-12 MED ORDER — TRAZODONE HCL 50 MG PO TABS: 50.0000 mg | ORAL_TABLET | Freq: Every evening | ORAL | 5 refills | Status: DC | PRN

## 2018-01-12 NOTE — Progress Notes (Signed)
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 Findings have mildly to moderately progressed since 02/01/2006 chest CT. Stable dominant 8 mm left lower lobe pulmonary nodule, for which 10 month stability has been demonstrated. Stable mild cardiomegaly. Left main and 3 vessel coronary atherosclerosis  CT chest 10/10/16: Stable changes of interstitial lung disease along with underlying emphysema. There is a rounded slightly spiculated 2.1 cm lesion in the left lower lobe. This measured 8 mm on the prior study and is worrisome for neoplasm. Other small scattered pulmonary nodules appears stable and may be related to the interstitial lung disease PET 03/02/17: 2.5 cm left lower lobe pulmonary nodule with maximum SUV 9.0 compatible with malignancy. No adenopathy or distant metastatic spread identified. Assuming non-small cell lung cancer the appearance is compatible with T1b N0 M0 disease (stage IA). TTNA LLL 04/11/17: adenocarcinoma  INTERVAL: Last seen 07/07/2017.  No major pulmonary events in the interim.  Hospitalized 9/11-9/16/2019 for left upper extremity cellulitis  SUBJ: This is a scheduled follow-up.  He has no new pulmonary complaints.  He remains on Symbicort and Spiriva.  His insurance changed recently and the Spiriva will be changed to Incruse once he runs out of his current supply.  He rarely uses albuterol.  He remains on prednisone 10 mg/day for left knee arthritis.  He is wearing  CPAP with oxygen bleed in with sleep.  He complains of insomnia.  He denies CP, fever, purulent sputum, hemoptysis, LE edema and calf tenderness.  OBJ:  Vitals:   01/12/18 0953 01/12/18 0959  BP:  104/62  Pulse:  77  Resp: 16   SpO2:  97%  Weight: 188 lb (85.3 kg)   Height: 5\' 6"  (1.676 m)   RA  EXAM:  Gen: No distress HEENT: NCAT, sclerae white Lungs: Moderately diminished BS, no wheezes or other adventitious sounds Cardiovascular: IR IR, rate controlled, no M Abdomen: Centripetal obesity, soft, NT, NABS Ext: 2+ symmetric pitting pretibial edema Neuro: No focal deficits  BMP Latest Ref Rng & Units 12/26/2017 12/25/2017 12/25/2017  Glucose 70 - 99 mg/dL 197(H) - 99  BUN 8 - 23 mg/dL 39(H) - 40(H)  Creatinine 0.61 - 1.24 mg/dL 1.60(H) - 1.59(H)  BUN/Creat Ratio 10 - 22 - - -  Sodium 135 - 145 mmol/L 138 - 141  Potassium 3.5 - 5.1 mmol/L 3.3(L) 3.3(L) 2.6(LL)  Chloride 98 - 111 mmol/L 103 - 104  CO2 22 - 32 mmol/L 27 - 29  Calcium 8.9 - 10.3 mg/dL 8.4(L) - 8.5(L)   CBC Latest Ref Rng & Units 12/25/2017 12/24/2017 12/22/2017  WBC 3.8 - 10.6 K/uL 14.7(H) 12.2(H) 15.3(H)  Hemoglobin 13.0 - 18.0 g/dL 13.6 14.0 13.8  Hematocrit 40.0 - 52.0 % 39.3(L) 40.2 40.7  Platelets 150 - 440 K/uL 217 223 216    CXR: No new film   IMPRESSION:   Former smoker COPD - no PFTs available but clinically severe Mild interstitial lung disease, NOS Restrictive pulmonary physiology due to ILD and obesity Severe baseline DOE OSA with nocturnal hypoxemia - well controlled on CPAP + O2 LLL  adenocarcinoma, S/P XRT Insomnia   PLAN:  Continue Symbicort and Spiriva (or Incruse depending on his insurance coverage) Continue albuterol as needed for SOB, wheezing, chest tightness, cough Continue CPAP and oxygen with sleep Trazodone 50 mg as needed for insomnia  Follow-up in 6 months.  Call sooner if needed   Merton Border, MD PCCM service Mobile [phone] Pager [phone] 01/12/2018 2:49 PM

## 2018-01-12 NOTE — Patient Instructions (Addendum)
Continue Symbicort and Spiriva (or Incruse) Continue albuterol inhaler as needed Continue CPAP and oxygen with sleep Trazodone 50 mg as needed 1 hour before bedtime for insomnia  Follow-up in 6 months.  Call sooner if needed

## 2018-01-15 ENCOUNTER — Other Ambulatory Visit: Payer: Self-pay | Admitting: Internal Medicine

## 2018-01-16 ENCOUNTER — Ambulatory Visit: Payer: Medicare Other | Admitting: Physician Assistant

## 2018-01-16 ENCOUNTER — Encounter: Payer: Self-pay | Admitting: Internal Medicine

## 2018-01-16 MED ORDER — TRAMADOL HCL 50 MG PO TABS: 50.0000 mg | ORAL_TABLET | Freq: Two times a day (BID) | ORAL | 0 refills | Status: DC

## 2018-01-16 NOTE — Addendum Note (Signed)
Addended by: Lurlean Nanny on: 01/16/2018 08:11 AM   Modules accepted: Orders

## 2018-01-16 NOTE — Telephone Encounter (Signed)
Tramadol last filled 12/30/17 #15, pt is requesting more than #15, see email in this encounter

## 2018-01-18 ENCOUNTER — Telehealth: Payer: Self-pay | Admitting: *Deleted

## 2018-01-18 NOTE — Telephone Encounter (Signed)
Could be cellulitis vs gout, but unable to r/o a cellulitis without seeing him. Needs OV for evaluation. Is the redness painful, warm to touch? Is he running fevers.

## 2018-01-18 NOTE — Telephone Encounter (Signed)
Copied from [place] [id number]. Topic: Quick Communication - See Telephone Encounter >> Jan 18, 2018 12:35 PM Antonieta Iba C wrote: CRM for notification. See Telephone encounter for: 01/18/18.   Estill Bamberg RN w/ [facility]:   She completed a CBC and uric acid on pt, she would like to verify if PCP has received results and to be advised further on if provider would like for her to do anything further?   CB: [phone]

## 2018-01-18 NOTE — Telephone Encounter (Signed)
Pt has appt for 01/19/18

## 2018-01-18 NOTE — Telephone Encounter (Signed)
Reviewed. He should make appt if he is concerned.

## 2018-01-19 ENCOUNTER — Ambulatory Visit (INDEPENDENT_AMBULATORY_CARE_PROVIDER_SITE_OTHER): Payer: Medicare Other | Admitting: Internal Medicine

## 2018-01-19 ENCOUNTER — Encounter: Payer: Self-pay | Admitting: Internal Medicine

## 2018-01-19 VITALS — BP 106/66 | HR 66 | Temp 97.7°F | Wt 179.0 lb

## 2018-01-19 DIAGNOSIS — R2232 Localized swelling, mass and lump, left upper limb: Secondary | ICD-10-CM | POA: Diagnosis not present

## 2018-01-19 DIAGNOSIS — M109 Gout, unspecified: Secondary | ICD-10-CM

## 2018-01-19 MED ORDER — COLCHICINE 0.6 MG PO TABS: 0.6000 mg | ORAL_TABLET | Freq: Two times a day (BID) | ORAL | 0 refills | Status: DC | PRN

## 2018-01-19 MED ORDER — METHYLPREDNISOLONE ACETATE 80 MG/ML IJ SUSP
80.0000 mg | Freq: Once | INTRAMUSCULAR | Status: AC
  Administered 2018-01-19: 80 mg via INTRAMUSCULAR

## 2018-01-19 NOTE — Progress Notes (Signed)
Subjective:    Patient ID: Shawn Grace., male    DOB: 30-Jul-1931, 84 y.o.   MRN: [id number]  HPI  Pt presents to the clinic today with c/o swelling and redness of his left lower arm. He noticed this about 1 week ago. The are is not tender. He reports he had a wart removed from the elbow, and this popped up as a result of that. He denies fever, chills or body aches. He has not tried anything OTC for this. He also c/o pain and swelling of knuckles of bilaterarl hands. The home health nurse drew labs. His WBC and uric acid level were elevated. He is not on anything for gout at this time. He was recently hospitalized for cellulitis of left forearm, which has resolved.  Review of Systems      Past Medical History:  Diagnosis Date  . Arthritis   . CAD    a. MI 01/29/1996 tx'd w/ TPA @ [facility]; b. Myoview 06/2005: EF 50%, scar @ apex, mild peri-infarct ischemia  . Cancer ([place])    skin  . Chronic atrial fibrillation    a. since 2006; b. on warfarin  . Chronic diastolic CHF (congestive heart failure) ([place])    a. echo 04/2006: EF lower limits of nl, mod LVH, mild aortic root dilatation, & mild MR, biatrial enlargement; b. echo 04/2013: EF 60%, mod dilated LA, mild MR & TR, mod pulm HTN w/ RV systolic pressure 53, c. echo 04/21/14: EF 55-60%, unable to exclude WMA, severely dilated LA 6.6 cm, nl RVSP, mildly dilated aortic root  . COPD (chronic obstructive pulmonary disease) ([facility])    oxygen prn at home  . CVA [id number]   x2  . DM   . Falls   . GERD (gastroesophageal reflux disease)   . History of kidney stones   . HYPERLIPIDEMIA   . HYPERTENSION   . Kidney stone    a. s/p left ureteral stenting 04/24/14  . Left arm weakness    limited movement. S/P fall injury  . Neuropathy of both feet   . Poor balance   . Wears dentures    full upper and lower    Current Outpatient Medications  Medication Sig Dispense Refill  . acetaminophen (TYLENOL) 650 MG CR tablet Take 650 mg by mouth every 8  (eight) hours as needed for pain.     Marland Kitchen albuterol (PROAIR HFA) 108 (90 Base) MCG/ACT inhaler USE 1 TO 2 INHALATIONS EVERY 4 HOURS AS NEEDED 25.5 g 3  . ALPRAZolam (XANAX) 0.25 MG tablet Take 1 tablet (0.25 mg total) by mouth at bedtime as needed for anxiety. 90 tablet 0  . apixaban (ELIQUIS) 2.5 MG TABS tablet Take 1 tablet (2.5 mg total) by mouth 2 (two) times daily. 60 tablet 0  . atorvastatin (LIPITOR) 40 MG tablet TAKE 1 TABLET DAILY 90 tablet 0  . citalopram (CELEXA) 10 MG tablet TAKE 1 TABLET DAILY 90 tablet 0  . clotrimazole (LOTRIMIN) 1 % cream Apply 1 application topically 2 (two) times daily. 30 g 0  . diclofenac sodium (VOLTAREN) 1 % GEL Apply 4 g topically 4 (four) times daily. 5 Tube 5  . dorzolamide (TRUSOPT) 2 % ophthalmic solution Place 1 drop into both eyes 3 (three) times daily.     Marland Kitchen ezetimibe (ZETIA) 10 MG tablet TAKE 1 TABLET DAILY 90 tablet 0  . finasteride (PROSCAR) 5 MG tablet TAKE 1 TABLET DAILY 90 tablet 4  . gabapentin (NEURONTIN) 100 MG capsule  TAKE 1 CAPSULE THREE TIMES A DAY 270 capsule 1  . glipiZIDE (GLUCOTROL) 5 MG tablet TAKE 1 TABLET TWICE A DAY BEFORE MEALS (Patient taking differently: Take 5 mg by mouth 2 (two) times daily before a meal. ) 180 tablet 0  . HUMALOG KWIKPEN 100 UNIT/ML KiwkPen INJECT 18 UNITS WITH BREAKFAST, 20 UNITS WITH LUNCH, AND 16 UNITS WITH DINNER (Patient taking differently: Inject 16-20 Units into the skin 3 (three) times daily. 18 units at breakfast, 20 units at lunch, and 16 units at dinner) 60 mL 0  . Insulin Pen Needle (BD PEN NEEDLE NANO U/F) 32G X 4 MM MISC USE THREE TIMES A DAY FOR INSULIN ADMINISTRATION. 270 each 1  . Insulin Syringe-Needle U-100 30G X 1/2" 1 ML MISC 1 each by Does not apply route 3 (three) times daily. 200 each 5  . JANUVIA 100 MG tablet TAKE 1 TABLET DAILY 90 tablet 1  . latanoprost (XALATAN) 0.005 % ophthalmic solution Place 1 drop into both eyes at bedtime.     Marland Kitchen LEVEMIR FLEXTOUCH 100 UNIT/ML Pen INJECT 42 UNITS  UNDER THE SKIN DAILY AT 10 P.M. (Patient taking differently: Inject 42 Units into the skin at bedtime. ) 45 mL 1  . levothyroxine (SYNTHROID, LEVOTHROID) 50 MCG tablet TAKE 1 TABLET DAILY BEFORE BREAKFAST 90 tablet 0  . metolazone (ZAROXOLYN) 5 MG tablet TAKE 1 TABLET DAILY AS NEEDED (Patient taking differently: Take 5 mg by mouth daily as needed (swelling). ) 90 tablet 2  . metoprolol succinate (TOPROL-XL) 50 MG 24 hr tablet TAKE 1 TABLET TWICE A DAY 180 tablet 0  . nitroGLYCERIN (NITROSTAT) 0.4 MG SL tablet Place 0.4 mg under the tongue every 5 (five) minutes as needed.      . nystatin-triamcinolone (MYCOLOG II) cream Apply 1 application topically 2 (two) times daily.     . potassium chloride SA (K-DUR,KLOR-CON) 20 MEQ tablet Take 3 tablets (60 mEq total) by mouth 2 (two) times daily. 540 tablet 0  . predniSONE (DELTASONE) 10 MG tablet TAKE 1 TABLET DAILY WITH BREAKFAST 90 tablet 1  . SYMBICORT 160-4.5 MCG/ACT inhaler USE 2 INHALATIONS TWICE A DAY (Patient taking differently: Inhale 2 puffs into the lungs 2 (two) times daily. ) 30.6 g 3  . tamsulosin (FLOMAX) 0.4 MG CAPS capsule TAKE 1 CAPSULE DAILY 90 capsule 1  . tiotropium (SPIRIVA HANDIHALER) 18 MCG inhalation capsule Place 1 capsule (18 mcg total) daily into inhaler and inhale. 90 capsule 3  . torsemide (DEMADEX) 20 MG tablet Take 40 mg by mouth 2 (two) times daily.    . traMADol (ULTRAM) 50 MG tablet Take 1 tablet (50 mg total) by mouth 2 (two) times daily. 60 tablet 0  . traZODone (DESYREL) 50 MG tablet Take 1 tablet (50 mg total) by mouth at bedtime as needed for sleep. 30 tablet 5   No current facility-administered medications for this visit.     Allergies  Allergen Reactions  . Contrast Media [Iodinated Diagnostic Agents] Shortness Of Breath  . Morphine And Related Other (See Comments)    Hallucinations   . Niacin And Related Dermatitis  . Other     Other reaction(s): SHORTNESS OF BREATH  . Amlodipine Rash    Family History    Problem Relation Age of Onset  . Heart disease Mother   . Heart disease Maternal Grandmother   . Diabetes Maternal Grandmother   . Cancer Neg Hx   . Stroke Neg Hx     Social History  Socioeconomic History  . Marital status: Married    Spouse name: Not on file  . Number of children: Not on file  . Years of education: Not on file  . Highest education level: Not on file  Occupational History  . Not on file  Social Needs  . Financial resource strain: Not on file  . Food insecurity:    Worry: Not on file    Inability: Not on file  . Transportation needs:    Medical: Not on file    Non-medical: Not on file  Tobacco Use  . Smoking status: Former Smoker    Packs/day: 2.00    Years: 40.00    Pack years: 80.00    Types: Cigarettes    Last attempt to quit: 05/24/1990    Years since quitting: 27.6  . Smokeless tobacco: Former Systems developer    Quit date: 05/24/1990  Substance and Sexual Activity  . Alcohol use: No  . Drug use: No  . Sexual activity: Not Currently  Lifestyle  . Physical activity:    Days per week: Not on file    Minutes per session: Not on file  . Stress: Not on file  Relationships  . Social connections:    Talks on phone: Not on file    Gets together: Not on file    Attends religious service: Not on file    Active member of club or organization: Not on file    Attends meetings of clubs or organizations: Not on file    Relationship status: Not on file  . Intimate partner violence:    Fear of current or ex partner: Not on file    Emotionally abused: Not on file    Physically abused: Not on file    Forced sexual activity: Not on file  Other Topics Concern  . Not on file  Social History Narrative  . Not on file     Constitutional: Denies fever, malaise, fatigue, headache or abrupt weight changes.  Musculoskeletal: Pt reports mass of left arm, pain and swelling of knuckles bilateral hands. Denies decrease in range of motion, difficulty with gait, muscle pain.     No other specific complaints in a complete review of systems (except as listed in HPI above).  Objective:   Physical Exam  BP 106/66   Pulse 66   Temp 97.7 F (36.5 C) (Oral)   Wt 179 lb (81.2 kg)   SpO2 96%   BMI 28.89 kg/m  Wt Readings from Last 3 Encounters:  01/19/18 179 lb (81.2 kg)  01/12/18 188 lb (85.3 kg)  01/05/18 188 lb 8 oz (85.5 kg)    General: Appears his stated age, chronically ill appearing, in NAD. Skin: Redness noted of knuckles, bilateral hands. 5 cm x 3 cm mass noted of left forearm, distal to the elbow. Musculoskeletal: Swelling noted of bilateral knuckles. Red, warm and tender to touch.   BMET    Component Value Date/Time   NA 138 12/26/2017 0355   NA 143 02/21/2015 1108   NA 137 08/05/2014 1839   K 3.3 (L) 12/26/2017 0355   K 3.6 08/05/2014 1839   CL 103 12/26/2017 0355   CL 106 08/05/2014 1839   CO2 27 12/26/2017 0355   CO2 23 08/05/2014 1839   GLUCOSE 197 (H) 12/26/2017 0355   GLUCOSE 235 (H) 08/05/2014 1839   BUN 39 (H) 12/26/2017 0355   BUN 26 02/21/2015 1108   BUN 19 08/05/2014 1839   CREATININE 1.60 (H) 12/26/2017 0355  CREATININE 1.20 08/05/2014 1839   CALCIUM 8.4 (L) 12/26/2017 0355   CALCIUM 8.6 (L) 08/05/2014 1839   GFRNONAA 38 (L) 12/26/2017 0355   GFRNONAA 57 (L) 08/05/2014 1839   GFRAA 44 (L) 12/26/2017 0355   GFRAA >60 08/05/2014 1839    Lipid Panel     Component Value Date/Time   CHOL 104 11/22/2016 1019   TRIG 154.0 (H) 11/22/2016 1019   HDL 43.80 11/22/2016 1019   CHOLHDL 2 11/22/2016 1019   VLDL 30.8 11/22/2016 1019   LDLCALC 29 11/22/2016 1019    CBC    Component Value Date/Time   WBC 14.7 (H) 12/25/2017 0418   RBC 4.57 12/25/2017 0418   HGB 13.6 12/25/2017 0418   HGB 13.3 08/05/2014 1839   HCT 39.3 (L) 12/25/2017 0418   HCT 40.1 08/05/2014 1839   PLT 217 12/25/2017 0418   PLT 246 08/05/2014 1839   MCV 86.1 12/25/2017 0418   MCV 83 08/05/2014 1839   MCH 29.7 12/25/2017 0418   MCHC 34.6  12/25/2017 0418   RDW 17.4 (H) 12/25/2017 0418   RDW 16.3 (H) 08/05/2014 1839   LYMPHSABS 1.6 12/21/2017 1717   LYMPHSABS 2.3 08/05/2014 1839   MONOABS 1.6 (H) 12/21/2017 1717   MONOABS 2.0 (H) 08/05/2014 1839   EOSABS 0.0 12/21/2017 1717   EOSABS 0.1 08/05/2014 1839   BASOSABS 0.1 12/21/2017 1717   BASOSABS 0.1 08/05/2014 1839    Hgb A1C Lab Results  Component Value Date   HGBA1C 6.7 (H) 02/15/2017            Assessment & Plan:   Mass of Left Arm:  Will obtain ultrasound for further evaluation No indication for abx at this time  Gout of Hand, Bilateral:  80 mg Depo IM eRx for Colchicine BID prn for gout attack Discussed short term use, possible interaction with Atorvastatin but shouldn't be an issue with short term use  Will follow up after ultrasound, return precautions discussed Webb Silversmith, NP

## 2018-01-19 NOTE — Patient Instructions (Signed)

## 2018-01-20 ENCOUNTER — Other Ambulatory Visit: Payer: Self-pay | Admitting: Internal Medicine

## 2018-01-21 ENCOUNTER — Other Ambulatory Visit: Payer: Self-pay | Admitting: Internal Medicine

## 2018-01-22 ENCOUNTER — Encounter: Payer: Self-pay | Admitting: Internal Medicine

## 2018-01-23 ENCOUNTER — Other Ambulatory Visit: Payer: Self-pay | Admitting: Internal Medicine

## 2018-01-24 ENCOUNTER — Ambulatory Visit
Admission: RE | Admit: 2018-01-24 | Discharge: 2018-01-24 | Disposition: A | Discharge status: Home / Self Care | Payer: Medicare Other | Source: Ambulatory Visit | Attending: Internal Medicine | Admitting: Internal Medicine

## 2018-01-24 DIAGNOSIS — R2232 Localized swelling, mass and lump, left upper limb: Secondary | ICD-10-CM | POA: Diagnosis present

## 2018-01-25 ENCOUNTER — Other Ambulatory Visit: Payer: Self-pay | Admitting: Pulmonary Disease

## 2018-01-25 ENCOUNTER — Other Ambulatory Visit: Payer: Self-pay

## 2018-01-25 DIAGNOSIS — F419 Anxiety disorder, unspecified: Secondary | ICD-10-CM

## 2018-01-25 MED ORDER — TRAZODONE HCL 50 MG PO TABS: 50.0000 mg | ORAL_TABLET | Freq: Every evening | ORAL | 1 refills | Status: DC | PRN

## 2018-01-25 MED ORDER — ALPRAZOLAM 0.25 MG PO TABS: 0.2500 mg | ORAL_TABLET | Freq: Every evening | ORAL | 0 refills | Status: DC | PRN

## 2018-01-25 NOTE — Telephone Encounter (Signed)
Last filled 11/09/2017 to mail order, note to pharmacy [facility] on or after 02/03/2018 to allow enough time for delivery

## 2018-01-25 NOTE — Telephone Encounter (Signed)
Paper refill request request 90 refill.

## 2018-01-27 ENCOUNTER — Encounter: Payer: Self-pay | Admitting: Internal Medicine

## 2018-01-29 ENCOUNTER — Other Ambulatory Visit: Payer: Self-pay | Admitting: Internal Medicine

## 2018-01-30 ENCOUNTER — Encounter: Payer: Medicare Other | Attending: Physician Assistant | Admitting: Physician Assistant

## 2018-01-30 ENCOUNTER — Other Ambulatory Visit: Payer: Self-pay | Admitting: Internal Medicine

## 2018-01-30 DIAGNOSIS — I5042 Chronic combined systolic (congestive) and diastolic (congestive) heart failure: Secondary | ICD-10-CM | POA: Insufficient documentation

## 2018-01-30 DIAGNOSIS — I4891 Unspecified atrial fibrillation: Secondary | ICD-10-CM | POA: Insufficient documentation

## 2018-01-30 DIAGNOSIS — Z833 Family history of diabetes mellitus: Secondary | ICD-10-CM | POA: Diagnosis not present

## 2018-01-30 DIAGNOSIS — Z8249 Family history of ischemic heart disease and other diseases of the circulatory system: Secondary | ICD-10-CM | POA: Insufficient documentation

## 2018-01-30 DIAGNOSIS — I252 Old myocardial infarction: Secondary | ICD-10-CM | POA: Diagnosis not present

## 2018-01-30 DIAGNOSIS — I110 Hypertensive heart disease with heart failure: Secondary | ICD-10-CM | POA: Diagnosis not present

## 2018-01-30 DIAGNOSIS — L97822 Non-pressure chronic ulcer of other part of left lower leg with fat layer exposed: Secondary | ICD-10-CM | POA: Insufficient documentation

## 2018-01-30 DIAGNOSIS — E11622 Type 2 diabetes mellitus with other skin ulcer: Secondary | ICD-10-CM | POA: Insufficient documentation

## 2018-01-30 DIAGNOSIS — Z7901 Long term (current) use of anticoagulants: Secondary | ICD-10-CM | POA: Diagnosis not present

## 2018-01-30 DIAGNOSIS — Z87891 Personal history of nicotine dependence: Secondary | ICD-10-CM | POA: Insufficient documentation

## 2018-01-30 DIAGNOSIS — M1990 Unspecified osteoarthritis, unspecified site: Secondary | ICD-10-CM | POA: Diagnosis not present

## 2018-01-30 DIAGNOSIS — R2232 Localized swelling, mass and lump, left upper limb: Secondary | ICD-10-CM

## 2018-01-30 DIAGNOSIS — J449 Chronic obstructive pulmonary disease, unspecified: Secondary | ICD-10-CM | POA: Diagnosis not present

## 2018-01-30 DIAGNOSIS — E1140 Type 2 diabetes mellitus with diabetic neuropathy, unspecified: Secondary | ICD-10-CM | POA: Insufficient documentation

## 2018-01-30 DIAGNOSIS — G4730 Sleep apnea, unspecified: Secondary | ICD-10-CM | POA: Diagnosis not present

## 2018-01-30 DIAGNOSIS — I499 Cardiac arrhythmia, unspecified: Secondary | ICD-10-CM | POA: Diagnosis not present

## 2018-01-30 DIAGNOSIS — I890 Lymphedema, not elsewhere classified: Secondary | ICD-10-CM | POA: Insufficient documentation

## 2018-02-01 ENCOUNTER — Other Ambulatory Visit: Payer: Self-pay | Admitting: Internal Medicine

## 2018-02-01 DIAGNOSIS — M109 Gout, unspecified: Secondary | ICD-10-CM

## 2018-02-03 NOTE — Telephone Encounter (Signed)
Will refill, but reiterate only as needed for joint pain and swelling concerning for gout attack

## 2018-02-04 NOTE — Progress Notes (Signed)
[name] ([id number]) Visit Report for 01/30/2018 Arrival Information Details Patient Name: [name], [name]. Date of Service: 01/30/2018 10:15 AM Medical Record Number: [id number] Patient Account Number: [id number] Date of Birth/Sex: 1931/08/15 (84 y.o. M) Treating RN: Cornell Barman Primary Care Anaysia Germer: Webb Silversmith Other Clinician: Referring Zakhia Seres: Webb Silversmith Treating Zerina Hallinan/Extender: Melburn Hake, HOYT Weeks in Treatment: 7 Visit Information History Since Last Visit Added or deleted any medications: Yes Patient Arrived: Walker Any new allergies or adverse reactions: No Arrival Time: 10:21 Had a fall or experienced change in No Accompanied By: daughter activities of daily living that may affect Transfer Assistance: None risk of falls: Patient Identification Verified: Yes Signs or symptoms of abuse/neglect since last visito No Secondary Verification Process Yes Hospitalized since last visit: No Completed: Implantable device outside of the clinic excluding No Patient Has Alerts: Yes cellular tissue based products placed in the center Patient Alerts: Patient on Blood since last visit: Thinner Has Dressing in Place as Prescribed: Yes Eliquis Pain Present Now: No DMII ABI [place] BILATERAL Electronic Signature(s) Signed: 01/30/2018 4:57:35 PM By: Lorine Bears RCP, RRT, CHT Entered By: Lorine Bears on 01/30/2018 10:27:00 [name] ([id number]) -------------------------------------------------------------------------------- Clinic Level of Care Assessment Details Patient Name: Shawn Bark T. Date of Service: 01/30/2018 10:15 AM Medical Record Number: [id number] Patient Account Number: [id number] Date of Birth/Sex: Jan 20, 1932 (84 y.o. M) Treating RN: Cornell Barman Primary Care Roldan Laforest: Webb Silversmith Other Clinician: Referring Ratasha Fabre: Webb Silversmith Treating Leveon Pelzer/Extender: Melburn Hake, HOYT Weeks in Treatment: 7 Clinic  Level of Care Assessment Items TOOL 4 Quantity Score []  - Use when only an EandM is performed on FOLLOW-UP visit 0 ASSESSMENTS - Nursing Assessment / Reassessment []  - Reassessment of Co-morbidities (includes updates in patient status) 0 X- 1 5 Reassessment of Adherence to Treatment Plan ASSESSMENTS - Wound and Skin Assessment / Reassessment []  - Simple Wound Assessment / Reassessment - one wound 0 X- 3 5 Complex Wound Assessment / Reassessment - multiple wounds []  - 0 Dermatologic / Skin Assessment (not related to wound area) ASSESSMENTS - Focused Assessment []  - Circumferential Edema Measurements - multi extremities 0 []  - 0 Nutritional Assessment / Counseling / Intervention []  - 0 Lower Extremity Assessment (monofilament, tuning fork, pulses) []  - 0 Peripheral Arterial Disease Assessment (using hand held doppler) ASSESSMENTS - Ostomy and/or Continence Assessment and Care []  - Incontinence Assessment and Management 0 []  - 0 Ostomy Care Assessment and Management (repouching, etc.) PROCESS - Coordination of Care X - Simple Patient / Family Education for ongoing care 1 15 []  - 0 Complex (extensive) Patient / Family Education for ongoing care X- 1 10 Staff obtains Programmer, systems, Records, Test Results / Process Orders []  - 0 Staff telephones HHA, Nursing Homes / Clarify orders / etc []  - 0 Routine Transfer to another Facility (non-emergent condition) []  - 0 Routine Hospital Admission (non-emergent condition) []  - 0 New Admissions / Biomedical engineer / Ordering NPWT, Apligraf, etc. []  - 0 Emergency Hospital Admission (emergent condition) X- 1 10 Simple Discharge Coordination JOHNEDWARD, BRODRICK T. ([id number]) []  - 0 Complex (extensive) Discharge Coordination PROCESS - Special Needs []  - Pediatric / Minor Patient Management 0 []  - 0 Isolation Patient Management []  - 0 Hearing / Language / Visual special needs []  - 0 Assessment of Community assistance (transportation, D/C  planning, etc.) []  - 0 Additional assistance / Altered mentation []  - 0 Support Surface(s) Assessment (bed, cushion, seat, etc.) INTERVENTIONS - Wound Cleansing / Measurement []  - Simple Wound Cleansing -  one wound 0 X- 3 5 Complex Wound Cleansing - multiple wounds X- 1 5 Wound Imaging (photographs - any number of wounds) []  - 0 Wound Tracing (instead of photographs) []  - 0 Simple Wound Measurement - one wound X- 3 5 Complex Wound Measurement - multiple wounds INTERVENTIONS - Wound Dressings []  - Small Wound Dressing one or multiple wounds 0 []  - 0 Medium Wound Dressing one or multiple wounds X- 1 20 Large Wound Dressing one or multiple wounds []  - 0 Application of Medications - topical []  - 0 Application of Medications - injection INTERVENTIONS - Miscellaneous []  - External ear exam 0 []  - 0 Specimen Collection (cultures, biopsies, blood, body fluids, etc.) []  - 0 Specimen(s) / Culture(s) sent or taken to Lab for analysis []  - 0 Patient Transfer (multiple staff / Civil Service fast streamer / Similar devices) []  - 0 Simple Staple / Suture removal (25 or less) []  - 0 Complex Staple / Suture removal (26 or more) []  - 0 Hypo / Hyperglycemic Management (close monitor of Blood Glucose) []  - 0 Ankle / Brachial Index (ABI) - do not check if billed separately X- 1 5 Vital Signs Borghi, Pax T. ([id number]) Has the patient been seen at the hospital within the last three years: Yes Total Score: 115 Level Of Care: New/Established - Level 3 Electronic Signature(s) Signed: 01/30/2018 6:05:50 PM By: Gretta Cool, BSN, RN, CWS, Kim RN, BSN Entered By: Gretta Cool, BSN, RN, CWS, Kim on 01/30/2018 11:01:51 [name] ([id number]) -------------------------------------------------------------------------------- Encounter Discharge Information Details Patient Name: Shawn Bark T. Date of Service: 01/30/2018 10:15 AM Medical Record Number: [id number] Patient Account Number: [id number] Date of  Birth/Sex: 07/19/1931 (84 y.o. M) Treating RN: Cornell Barman Primary Care [name]: Webb Silversmith Other Clinician: Referring Glady Ouderkirk: Webb Silversmith Treating Kayliee Atienza/Extender: Melburn Hake, HOYT Weeks in Treatment: 7 Encounter Discharge Information Items Discharge Condition: Stable Ambulatory Status: Walker Discharge Destination: Home Transportation: Private Auto Accompanied By: self Schedule Follow-up Appointment: Yes Clinical Summary of Care: Electronic Signature(s) Signed: 01/30/2018 6:05:50 PM By: Gretta Cool, BSN, RN, CWS, Kim RN, BSN Entered By: Gretta Cool, BSN, RN, CWS, Kim on 01/30/2018 11:06:09 [name] ([id number]) -------------------------------------------------------------------------------- Lower Extremity Assessment Details Patient Name: FARRON, WATROUS T. Date of Service: 01/30/2018 10:15 AM Medical Record Number: [id number] Patient Account Number: [id number] Date of Birth/Sex: 31-Jan-1932 (84 y.o. M) Treating RN: Cornell Barman Primary Care Jasha Hodzic: Webb Silversmith Other Clinician: Referring Prabhjot Piscitello: Webb Silversmith Treating Markel Kurtenbach/Extender: Melburn Hake, HOYT Weeks in Treatment: 7 Edema Assessment Assessed: [Left: Yes] [Right: No] Edema: [Left: Ye] [Right: s] Calf Left: Right: Point of Measurement: 30 cm From Medial Instep 36 cm cm Ankle Left: Right: Point of Measurement: 10 cm From Medial Instep 23.5 cm cm Vascular Assessment Pulses: Dorsalis Pedis Palpable: [Left:Yes] Posterior Tibial Extremity colors, hair growth, and conditions: Extremity Color: [Left:Hyperpigmented] Hair Growth on Extremity: [Left:No] Temperature of Extremity: [Left:Warm] Capillary Refill: [Left:< 3 seconds] Toe Nail Assessment Left: Right: Thick: Yes Discolored: Yes Deformed: No Improper Length and Hygiene: No Notes Dry Skin Electronic Signature(s) Signed: 01/30/2018 10:47:04 AM By: Gretta Cool, BSN, RN, CWS, Kim RN, BSN Entered By: Gretta Cool, BSN, RN, CWS, Kim on 01/30/2018 10:47:04 Appleby,  Gavin Pound ([id number]) -------------------------------------------------------------------------------- Multi Wound Chart Details Patient Name: Shawn Bark T. Date of Service: 01/30/2018 10:15 AM Medical Record Number: [id number] Patient Account Number: [id number] Date of Birth/Sex: [birth year] (84 y.o. M) Treating RN: Cornell Barman Primary Care Chrisanne Loose: Webb Silversmith Other Clinician: Referring Jeannett Dekoning: Webb Silversmith Treating Yailyn Strack/Extender: STONE III, HOYT Weeks in Treatment: 7 Vital Signs Height(in): 66  Pulse(bpm): 53 Weight(lbs): 182 Blood Pressure(mmHg): 107/50 Body Mass Index(BMI): 29 Temperature(F): 98.0 Respiratory Rate 16 (breaths/min): Photos: [2:No Photos] [3:No Photos] [4:No Photos] Wound Location: [2:Left, Distal, Lateral Lower Leg Left Lower Leg - Lateral] [4:Left Lower Leg - Lateral, Proximal] Wounding Event: [2:Gradually Appeared] [3:Not Known] [4:Blister] Primary Etiology: [2:Diabetic Wound/Ulcer of the Lower Extremity] [3:Venous Leg Ulcer] [4:Venous Leg Ulcer] Comorbid History: [2:N/A] [3:Chronic Obstructive Pulmonary Disease (COPD), Sleep Apnea, Arrhythmia, Congestive Heart Failure, Hypertension, Myocardial Infarction, Type II Diabetes, Osteoarthritis, Neuropathy, Received Radiation] [4:Chronic Obstructive  Pulmonary Disease (COPD), Sleep Apnea, Arrhythmia, Congestive Heart Failure, Hypertension, Myocardial Infarction, Type II Diabetes, Osteoarthritis, Neuropathy, Received Radiation] Date Acquired: [2:09/05/2017] [3:01/16/2018] [4:01/16/2018] Weeks of Treatment: [2:7] [3:0] [4:0] Wound Status: [2:Healed - Epithelialized] [3:Open] [4:Open] Measurements L x W x D [2:0x0x0] [3:3.5x1.3x0.1] [4:0.9x0.6x0.1] (cm) Area (cm) : [2:0] [3:3.574] [4:0.424] Volume (cm) : [2:0] [3:0.357] [4:0.042] % Reduction in Area: [2:100.00%] [3:0.00%] [4:0.00%] % Reduction in Volume: [2:100.00%] [3:0.00%] [4:0.00%] Classification: [2:Grade 1] [3:Full Thickness Without Exposed Support  Structures] [4:Full Thickness Without Exposed Support Structures] Exudate Amount: [2:N/A] [3:Medium] [4:Small] Exudate Type: [2:N/A] [3:Serosanguineous] [4:Serous] Exudate Color: [2:N/A] [3:red, brown] [4:amber] Granulation Amount: [2:N/A] [3:None Present (0%)] [4:None Present (0%)] Necrotic Amount: [2:N/A] [3:Small (1-33%)] [4:Small (1-33%)] Periwound Skin Texture: [2:No Abnormalities Noted] [3:Excoriation: No Induration: No Callus: No Crepitus: No Rash: No Scarring: No] [4:Excoriation: No Induration: No Callus: No Crepitus: No Rash: No Scarring: No] Periwound Skin Moisture: [2:No Abnormalities Noted] Maceration: No Maceration: No Dry/Scaly: No Dry/Scaly: No Periwound Skin Color: No Abnormalities Noted Atrophie Blanche: No Atrophie Blanche: No Cyanosis: No Cyanosis: No Ecchymosis: No Ecchymosis: No Erythema: No Erythema: No Hemosiderin Staining: No Hemosiderin Staining: No Mottled: No Mottled: No Pallor: No Pallor: No Rubor: No Rubor: No Tenderness on Palpation: No No No Wound Preparation: N/A Ulcer Cleansing: Ulcer Cleansing: Rinsed/Irrigated with Saline Rinsed/Irrigated with Saline Topical Anesthetic Applied: Topical Anesthetic Applied: Other: lidocaine 4% Other: lidocaine 4% Wound Number: 5 N/A N/A Photos: No Photos N/A N/A Wound Location: Left Lower Leg - Medial, N/A N/A Anterior Wounding Event: Bite N/A N/A Primary Etiology: Venous Leg Ulcer N/A N/A Comorbid History: Chronic Obstructive N/A N/A Pulmonary Disease (COPD), Sleep Apnea, Arrhythmia, Congestive Heart Failure, Hypertension, Myocardial Infarction, Type II Diabetes, Osteoarthritis, Neuropathy, Received Radiation Date Acquired: 01/16/2018 N/A N/A Weeks of Treatment: 0 N/A N/A Wound Status: Open N/A N/A Measurements L x W x D 0.7x0.9x0.1 N/A N/A (cm) Area (cm) : 0.495 N/A N/A Volume (cm) : 0.049 N/A N/A % Reduction in Area: N/A N/A N/A % Reduction in Volume: N/A N/A N/A Classification: Full  Thickness Without N/A N/A Exposed Support Structures Exudate Amount: Small N/A N/A Exudate Type: Serous N/A N/A Exudate Color: amber N/A N/A Granulation Amount: None Present (0%) N/A N/A Necrotic Amount: Small (1-33%) N/A N/A Exposed Structures: Fascia: No N/A N/A Fat Layer (Subcutaneous Tissue) Exposed: No Tendon: No Muscle: No Joint: No Bone: No Periwound Skin Texture: Excoriation: No N/A N/A Induration: No Callus: No Crepitus: No Bayard, Ruddy T. ([id number]) Rash: No Scarring: No Periwound Skin Moisture: Maceration: No N/A N/A Dry/Scaly: No Periwound Skin Color: Atrophie Blanche: No N/A N/A Cyanosis: No Ecchymosis: No Erythema: No Hemosiderin Staining: No Mottled: No Pallor: No Rubor: No Tenderness on Palpation: No N/A N/A Wound Preparation: Ulcer Cleansing: N/A N/A Rinsed/Irrigated with Saline Topical Anesthetic Applied: Other: lidocaine 4% Treatment Notes Electronic Signature(s) Signed: 01/30/2018 6:05:50 PM By: Gretta Cool, BSN, RN, CWS, Kim RN, BSN Entered By: Gretta Cool, BSN, RN, CWS, Kim on 01/30/2018 10:51:56 [name] ([id number]) --------------------------------------------------------------------------------  Multi-Disciplinary Care Plan Details Patient Name: ORYAN, WINTERTON. Date of Service: 01/30/2018 10:15 AM Medical Record Number: [id number] Patient Account Number: [id number] Date of Birth/Sex: [birth year] (84 y.o. M) Treating RN: Cornell Barman Primary Care Jaleeya Mcnelly: Webb Silversmith Other Clinician: Referring Cianna Kasparian: Webb Silversmith Treating Chrystina Naff/Extender: Melburn Hake, HOYT Weeks in Treatment: 7 Active Inactive ` Abuse / Safety / Falls / Self Care Management Nursing Diagnoses: Potential for falls Goals: Patient will not experience any injury related to falls Date Initiated: 12/06/2017 Target Resolution Date: 03/18/2018 Goal Status: Active Interventions: Assess Activities of Daily Living upon admission and as needed Assess fall risk on  admission and as needed Assess: immobility, friction, shearing, incontinence upon admission and as needed Assess impairment of mobility on admission and as needed per policy Assess personal safety and home safety (as indicated) on admission and as needed Assess self care needs on admission and as needed Notes: ` Nutrition Nursing Diagnoses: Imbalanced nutrition Impaired glucose control: actual or potential Potential for alteratiion in Nutrition/Potential for imbalanced nutrition Goals: Patient/caregiver agrees to and verbalizes understanding of need to use nutritional supplements and/or vitamins as prescribed Date Initiated: 12/06/2017 Target Resolution Date: 03/18/2018 Goal Status: Active Patient/caregiver will maintain therapeutic glucose control Date Initiated: 12/06/2017 Target Resolution Date: 03/18/2018 Goal Status: Active Interventions: Assess patient nutrition upon admission and as needed per policy Provide education on elevated blood sugars and impact on wound healing Provide education on nutrition ALEXIS, MIZUNO ([id number]) Notes: ` Orientation to the Wound Care Program Nursing Diagnoses: Knowledge deficit related to the wound healing center program Goals: Patient/caregiver will verbalize understanding of the [facility] Date Initiated: 12/06/2017 Target Resolution Date: 12/17/2017 Goal Status: Active Interventions: Provide education on orientation to the wound center Notes: ` Wound/Skin Impairment Nursing Diagnoses: Impaired tissue integrity Knowledge deficit related to ulceration/compromised skin integrity Goals: Ulcer/skin breakdown will have a volume reduction of 80% by week 12 Date Initiated: 12/06/2017 Target Resolution Date: 03/11/2018 Goal Status: Active Interventions: Assess patient/caregiver ability to perform ulcer/skin care regimen upon admission and as needed Assess ulceration(s) every visit Notes: Electronic  Signature(s) Signed: 01/30/2018 10:43:03 AM By: Gretta Cool, BSN, RN, CWS, Kim RN, BSN Entered By: Gretta Cool, BSN, RN, CWS, Kim on 01/30/2018 10:43:02 Jessie, Gavin Pound ([id number]) -------------------------------------------------------------------------------- Pain Assessment Details Patient Name: Shawn Bark T. Date of Service: 01/30/2018 10:15 AM Medical Record Number: [id number] Patient Account Number: [id number] Date of Birth/Sex: [birth year] (84 y.o. M) Treating RN: Cornell Barman Primary Care Abdelrahman Nair: Webb Silversmith Other Clinician: Referring Cameryn Schum: Webb Silversmith Treating Charizma Gardiner/Extender: Melburn Hake, HOYT Weeks in Treatment: 7 Active Problems Location of Pain Severity and Description of Pain Patient Has Paino No Site Locations Pain Management and Medication Current Pain Management: Electronic Signature(s) Signed: 01/30/2018 4:57:35 PM By: Lorine Bears RCP, RRT, CHT Signed: 01/30/2018 6:05:50 PM By: Gretta Cool, BSN, RN, CWS, Kim RN, BSN Entered By: Lorine Bears on 01/30/2018 10:24:55 [name] ([id number]) -------------------------------------------------------------------------------- Patient/Caregiver Education Details Patient Name: Shawn Bark T. Date of Service: 01/30/2018 10:15 AM Medical Record Number: [id number] Patient Account Number: [id number] Date of Birth/Gender: Jul 12, 1931 (84 y.o. M) Treating RN: Cornell Barman Primary Care Physician: Webb Silversmith Other Clinician: Referring Physician: Webb Silversmith Treating Physician/Extender: Sharalyn Ink in Treatment: 7 Education Assessment Education Provided To: Patient Education Topics Provided Venous: Handouts: Controlling Swelling with Compression Stockings Methods: Demonstration, Explain/Verbal Responses: State content correctly Wound/Skin Impairment: Handouts: Caring for Your Ulcer Methods: Demonstration, Explain/Verbal Responses: State content correctly Electronic  Signature(s) Signed: 01/30/2018 6:05:50 PM By: Gretta Cool, BSN, RN,  CWS, Kim RN, BSN Entered By: Gretta Cool, BSN, RN, CWS, Kim on 01/30/2018 11:02:15 [name] ([id number]) -------------------------------------------------------------------------------- Wound Assessment Details Patient Name: Shawn Bark T. Date of Service: 01/30/2018 10:15 AM Medical Record Number: [id number] Patient Account Number: [id number] Date of Birth/Sex: 06-23-1931 (84 y.o. M) Treating RN: Secundino Ginger Primary Care Dillard Pascal: Webb Silversmith Other Clinician: Referring Tayari Yankee: Webb Silversmith Treating Sadye Kiernan/Extender: STONE III, HOYT Weeks in Treatment: 7 Wound Status Wound Number: 2 Primary Diabetic Wound/Ulcer of the Lower Etiology: Extremity Wound Location: Left, Distal, Lateral Lower Leg Wound Status: Healed - Epithelialized Wounding Event: Gradually Appeared Date Acquired: 09/05/2017 Weeks Of Treatment: 7 Clustered Wound: No Photos Photo Uploaded By: Secundino Ginger on 01/30/2018 10:56:56 Wound Measurements Length: (cm) 0 % Re Width: (cm) 0 % Re Depth: (cm) 0 Area: (cm) 0 Volume: (cm) 0 duction in Area: 100% duction in Volume: 100% Wound Description Classification: Grade 1 Periwound Skin Texture Texture Color No Abnormalities Noted: No No Abnormalities Noted: No Moisture No Abnormalities Noted: No Electronic Signature(s) Signed: 01/30/2018 4:40:10 PM By: Secundino Ginger Entered By: Secundino Ginger on 01/30/2018 10:35:49 Sisler, Gavin Pound ([id number]) -------------------------------------------------------------------------------- Wound Assessment Details Patient Name: Shawn Bark T. Date of Service: 01/30/2018 10:15 AM Medical Record Number: [id number] Patient Account Number: [id number] Date of Birth/Sex: Jul 28, 1931 (84 y.o. M) Treating RN: Secundino Ginger Primary Care Cheris Tweten: Webb Silversmith Other Clinician: Referring Mckenna Boruff: Webb Silversmith Treating Darius Fillingim/Extender: STONE III, HOYT Weeks in Treatment:  7 Wound Status Wound Number: 3 Primary Venous Leg Ulcer Etiology: Wound Location: Left Lower Leg - Lateral Wound Open Wounding Event: Not Known Status: Date Acquired: 01/16/2018 Comorbid Chronic Obstructive Pulmonary Disease (COPD), Weeks Of Treatment: 0 History: Sleep Apnea, Arrhythmia, Congestive Heart Clustered Wound: No Failure, Hypertension, Myocardial Infarction, Type II Diabetes, Osteoarthritis, Neuropathy, Received Radiation Photos Photo Uploaded By: Secundino Ginger on 01/30/2018 10:53:48 Wound Measurements Length: (cm) 3.5 Width: (cm) 1.3 Depth: (cm) 0.1 Area: (cm) 3.574 Volume: (cm) 0.357 % Reduction in Area: 0% % Reduction in Volume: 0% Tunneling: No Undermining: No Wound Description Full Thickness Without Exposed Support Foul Classification: Structures Slou Exudate Medium Amount: Exudate Type: Serosanguineous Exudate Color: red, brown Odor After Cleansing: No gh/Fibrino Yes Wound Bed Granulation Amount: None Present (0%) Exposed Structure Necrotic Amount: Small (1-33%) Fascia Exposed: No Necrotic Quality: Adherent Slough Fat Layer (Subcutaneous Tissue) Exposed: No Tendon Exposed: No Muscle Exposed: No Joint Exposed: No Aro, Bingham T. ([id number]) Bone Exposed: No Periwound Skin Texture Texture Color No Abnormalities Noted: Yes No Abnormalities Noted: No Atrophie Blanche: No Moisture Cyanosis: No No Abnormalities Noted: No Ecchymosis: No Dry / Scaly: No Erythema: No Maceration: No Hemosiderin Staining: No Mottled: No Pallor: No Rubor: No Wound Preparation Ulcer Cleansing: Rinsed/Irrigated with Saline Topical Anesthetic Applied: Other: lidocaine 4%, Treatment Notes Wound #3 (Left, Lateral Lower Leg) 1. Cleansed with: Clean wound with Normal Saline 2. Anesthetic Topical Lidocaine 4% cream to wound bed prior to debridement Notes Silver Cell, ABD, Conform secured with Tubigrip. Electronic Signature(s) Signed: 01/30/2018 4:40:10 PM  By: Secundino Ginger Entered By: Secundino Ginger on 01/30/2018 10:41:36 Siddique, Gavin Pound ([id number]) -------------------------------------------------------------------------------- Wound Assessment Details Patient Name: Shawn Bark T. Date of Service: 01/30/2018 10:15 AM Medical Record Number: [id number] Patient Account Number: [id number] Date of Birth/Sex: 08-Aug-1931 (84 y.o. M) Treating RN: Secundino Ginger Primary Care Elfrida Pixley: Webb Silversmith Other Clinician: Referring Pete Schnitzer: Webb Silversmith Treating Anabell Swint/Extender: STONE III, HOYT Weeks in Treatment: 7 Wound Status Wound Number: 4 Primary Venous Leg Ulcer Etiology: Wound Location: Left Lower Leg - Lateral, Proximal Wound Open Wounding  Event: Blister Status: Date Acquired: 01/16/2018 Comorbid Chronic Obstructive Pulmonary Disease (COPD), Weeks Of Treatment: 0 History: Sleep Apnea, Arrhythmia, Congestive Heart Clustered Wound: No Failure, Hypertension, Myocardial Infarction, Type II Diabetes, Osteoarthritis, Neuropathy, Received Radiation Photos Photo Uploaded By: Secundino Ginger on 01/30/2018 10:53:49 Wound Measurements Length: (cm) 0.9 Width: (cm) 0.6 Depth: (cm) 0.1 Area: (cm) 0.424 Volume: (cm) 0.042 % Reduction in Area: 0% % Reduction in Volume: 0% Tunneling: No Undermining: No Wound Description Full Thickness Without Exposed Support Foul Classification: Structures Sloug Exudate Small Amount: Exudate Type: Serous Exudate Color: amber Odor After Cleansing: No h/Fibrino Yes Wound Bed Granulation Amount: None Present (0%) Exposed Structure Necrotic Amount: Small (1-33%) Fascia Exposed: No Fat Layer (Subcutaneous Tissue) Exposed: No Tendon Exposed: No Muscle Exposed: No Joint Exposed: No Farias, Pershing T. ([id number]) Bone Exposed: No Periwound Skin Texture Texture Color No Abnormalities Noted: No No Abnormalities Noted: No Callus: No Atrophie Blanche: No Crepitus: No Cyanosis: No Excoriation: No Ecchymosis:  No Induration: No Erythema: No Rash: No Hemosiderin Staining: No Scarring: No Mottled: No Pallor: No Moisture Rubor: No No Abnormalities Noted: No Dry / Scaly: No Maceration: No Wound Preparation Ulcer Cleansing: Rinsed/Irrigated with Saline Topical Anesthetic Applied: Other: lidocaine 4%, Treatment Notes Wound #4 (Left, Proximal, Lateral Lower Leg) 1. Cleansed with: Clean wound with Normal Saline 2. Anesthetic Topical Lidocaine 4% cream to wound bed prior to debridement Notes Silver Cell, ABD, Conform secured with Tubigrip. Electronic Signature(s) Signed: 01/30/2018 4:40:10 PM By: Secundino Ginger Entered By: Secundino Ginger on 01/30/2018 10:45:25 Priebe, Gavin Pound ([id number]) -------------------------------------------------------------------------------- Wound Assessment Details Patient Name: Shawn Bark T. Date of Service: 01/30/2018 10:15 AM Medical Record Number: [id number] Patient Account Number: [id number] Date of Birth/Sex: 1932/01/26 (84 y.o. M) Treating RN: Secundino Ginger Primary Care Laiana Fratus: Webb Silversmith Other Clinician: Referring Teon Hudnall: Webb Silversmith Treating Joannah Gitlin/Extender: STONE III, HOYT Weeks in Treatment: 7 Wound Status Wound Number: 5 Primary Venous Leg Ulcer Etiology: Wound Location: Left Lower Leg - Medial, Anterior Wound Open Wounding Event: Bite Status: Date Acquired: 01/16/2018 Comorbid Chronic Obstructive Pulmonary Disease (COPD), Weeks Of Treatment: 0 History: Sleep Apnea, Arrhythmia, Congestive Heart Clustered Wound: No Failure, Hypertension, Myocardial Infarction, Type II Diabetes, Osteoarthritis, Neuropathy, Received Radiation Photos Photo Uploaded By: Secundino Ginger on 01/30/2018 10:56:27 Wound Measurements Length: (cm) 0.7 % Red Width: (cm) 0.9 % Red Depth: (cm) 0.1 Tunne Area: (cm) 0.495 Unde Volume: (cm) 0.049 uction in Area: uction in Volume: ling: No rmining: No Wound Description Full Thickness Without Exposed Support  Foul Classification: Structures Sloug Exudate Small Amount: Exudate Type: Serous Exudate Color: amber Odor After Cleansing: No h/Fibrino Yes Wound Bed Granulation Amount: None Present (0%) Exposed Structure Necrotic Amount: Small (1-33%) Fascia Exposed: No Fat Layer (Subcutaneous Tissue) Exposed: No Tendon Exposed: No Muscle Exposed: No Joint Exposed: No Wilczak, Aydan T. ([id number]) Bone Exposed: No Periwound Skin Texture Texture Color No Abnormalities Noted: No No Abnormalities Noted: No Callus: No Atrophie Blanche: No Crepitus: No Cyanosis: No Excoriation: No Ecchymosis: No Induration: No Erythema: No Rash: No Hemosiderin Staining: No Scarring: No Mottled: No Pallor: No Moisture Rubor: No No Abnormalities Noted: No Dry / Scaly: No Maceration: No Wound Preparation Ulcer Cleansing: Rinsed/Irrigated with Saline Topical Anesthetic Applied: Other: lidocaine 4%, Treatment Notes Wound #5 (Left, Medial, Anterior Lower Leg) 1. Cleansed with: Clean wound with Normal Saline 2. Anesthetic Topical Lidocaine 4% cream to wound bed prior to debridement Notes Silver Cell, ABD, Conform secured with Tubigrip. Electronic Signature(s) Signed: 01/30/2018 4:40:10 PM By: Secundino Ginger Entered By: Secundino Ginger  on 01/30/2018 10:48:06 HAIDER, HORNADAY TMarland Kitchen ([id number]) -------------------------------------------------------------------------------- Vitals Details Patient Name: SEVYN, PAREDEZ T. Date of Service: 01/30/2018 10:15 AM Medical Record Number: [id number] Patient Account Number: [id number] Date of Birth/Sex: 18-Nov-1931 (84 y.o. M) Treating RN: Cornell Barman Primary Care Danyele Smejkal: Webb Silversmith Other Clinician: Referring Viridiana Spaid: Webb Silversmith Treating Leitha Hyppolite/Extender: Melburn Hake, HOYT Weeks in Treatment: 7 Vital Signs Time Taken: 10:25 Temperature (F): 98.0 Height (in): 66 Pulse (bpm): 53 Weight (lbs): 182 Respiratory Rate (breaths/min): 16 Body Mass Index (BMI):  29.4 Blood Pressure (mmHg): 107/50 Reference Range: 80 - 120 mg / dl Electronic Signature(s) Signed: 01/30/2018 4:57:35 PM By: Lorine Bears RCP, RRT, CHT Entered By: Becky Sax, Amado Nash on 01/30/2018 10:29:22

## 2018-02-04 NOTE — Progress Notes (Signed)
JAZEN, SPRAGGINS ([id number]) Visit Report for 01/30/2018 Chief Complaint Document Details Patient Name: [name], [name]. Date of Service: 01/30/2018 10:15 AM Medical Record Number: [id number] Patient Account Number: [id number] Date of Birth/Sex: 07/07/1931 (84 y.o. M) Treating RN: Cornell Barman Primary Care Provider: Webb Silversmith Other Clinician: Referring Provider: Webb Silversmith Treating Provider/Extender: Melburn Hake, Brannen Koppen Weeks in Treatment: 7 Information Obtained from: Patient Chief Complaint Left lateral LE ulcer Electronic Signature(s) Signed: 02/03/2018 8:35:57 AM By: Worthy Keeler PA-C Entered By: Worthy Keeler on 01/30/2018 10:23:46 Bebeau, Gavin Pound ([id number]) -------------------------------------------------------------------------------- HPI Details Patient Name: [name] T. Date of Service: 01/30/2018 10:15 AM Medical Record Number: [id number] Patient Account Number: [id number] Date of Birth/Sex: 06-28-1931 (84 y.o. M) Treating RN: Cornell Barman Primary Care Provider: Webb Silversmith Other Clinician: Referring Provider: Webb Silversmith Treating Provider/Extender: Melburn Hake, Jony Ladnier Weeks in Treatment: 7 History of Present Illness Associated Signs and Symptoms: Patient has a history of lymphedema, type II diabetes mellitus, long-term use of Eliquis, congestive heart failure, and COPD. HPI Description: 12/06/17 on evaluation today patient is here for evaluation of an ulcer on the left lateral lower extremity which has been present for roughly 2-3 months. Currently he has been using silver alginate and it was considered putting the patient in an [employer] wrap. However he did have arterial studies which actually showed to be fairly normal on the right however on the left he did have an ABI of 0.75. With that being said this ABI was consistent with at least mild-to-moderate arterial disease. The patient actually has some question on the test as to whether or not this result  was actually even accurate his arterial disease could be more significant. For that reason I think he may need to see a vascular specialist to see about possibly undergoing an angiogram to see if arterial flow could be improved. With that being said it does appear that the patient rather desperately needs compression therapy he has significant edema of the bilateral lower extremities noted during evaluation today. No fevers, chills, nausea, or vomiting noted at this time. 12/16/17 on evaluation today patient actually appears to be doing better in regard to his lower extremity ulcers of the left lower extremity. Both wounds are significantly smaller and improved compared to the initial evaluation last week. Overall I'm very pleased with the progress at this time. The patient likewise is pleased with how things appear. He states that he has thought about it and really does not want to have an angiogram that would prefer not to go see the vascular specialist which we had recommended last week. He states when he had this previous on top of the fact that he had issues with getting the groin area to stop bleeding he states that the die also seemed to cause issues in fact he states through him into having another heart attack. Obviously he has good reasons to want to try to avoid this which I completely understand. 01/02/18 on evaluation today patient actually appears to be showing signs of good improvement which is excellent news. He has been tolerating the dressing changes without complication. Unfortunately he was in the hospital due to an infection of his left forearm he was on IV antibiotics for six days. The good news is he seems to be doing much better in that regard his left lower extremity also is doing better at this point. There does not appear to be any evidence of infection at this time which is great news. Overall I'm  very pleased with the progress he seems to be making. 01/30/18 patient has not  been seen our office actually since 23 September. At that point following he apparently healed but then had several areas that we opened shortly following. He has new areas in three different spots on evaluation today. This is on the left lower extremity were the ones previously were. With that being said there does not appear to be any evidence of infection at this time which is good news. No fevers, chills, nausea, or vomiting noted at this time. Electronic Signature(s) Signed: 02/03/2018 8:35:57 AM By: Worthy Keeler PA-C Entered By: Worthy Keeler on 01/30/2018 11:09:55 Granderson, Gavin Pound ([id number]) -------------------------------------------------------------------------------- Physical Exam Details Patient Name: [name] T. Date of Service: 01/30/2018 10:15 AM Medical Record Number: [id number] Patient Account Number: [id number] Date of Birth/Sex: 1932/01/04 (84 y.o. M) Treating RN: Cornell Barman Primary Care Provider: Webb Silversmith Other Clinician: Referring Provider: Webb Silversmith Treating Provider/Extender: STONE III, Shardee Dieu Weeks in Treatment: 7 Constitutional Well-nourished and well-hydrated in no acute distress. Respiratory normal breathing without difficulty. clear to auscultation bilaterally. Cardiovascular regular rate and rhythm with normal S1, S2. 2+ pitting edema of the bilateral lower extremities. Psychiatric this patient is able to make decisions and demonstrates good insight into disease process. Alert and Oriented x 3. pleasant and cooperative. Notes Patient's wound beds currently did not have any significant slough buildup that cannot be cleaned away easily with saline and gauze. This was performed today without complication post debridement the wound bed appears to be doing better he continues to have a significant amount of lower extremity edema at this time. Electronic Signature(s) Signed: 02/03/2018 8:35:57 AM By: Worthy Keeler PA-C Entered By: Worthy Keeler on 01/30/2018 11:11:08 [name] ([id number]) -------------------------------------------------------------------------------- Physician Orders Details Patient Name: [name] T. Date of Service: 01/30/2018 10:15 AM Medical Record Number: [id number] Patient Account Number: [id number] Date of Birth/Sex: 1931/10/13 (84 y.o. 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 Wound #4 Left,Proximal,Lateral Lower Leg o Clean wound with Normal Saline. Wound #5 Left,Medial,Anterior Lower Leg o Clean wound with Normal Saline. Anesthetic (add to Medication List) Wound #3 Left,Lateral Lower Leg o Topical Lidocaine 4% cream applied to wound bed prior to debridement (In Clinic Only). Wound #4 Left,Proximal,Lateral Lower Leg o Topical Lidocaine 4% cream applied to wound bed prior to debridement (In Clinic Only). Wound #5 Left,Medial,Anterior Lower Leg o Topical Lidocaine 4% cream applied to wound bed prior to debridement (In Clinic Only). Primary Wound Dressing Wound #3 Left,Lateral Lower Leg o Silver Alginate Wound #4 Left,Proximal,Lateral Lower Leg o Silver Alginate Wound #5 Left,Medial,Anterior Lower Leg o Silver Alginate Secondary Dressing Wound #3 Left,Lateral Lower Leg o ABD and  Kerlix/Conform Abee, Ethyn T. ([id number]) Wound #4 Left,Proximal,Lateral Lower Leg o ABD and Kerlix/Conform Wound #5 Left,Medial,Anterior Lower Leg o ABD and Kerlix/Conform Dressing Change Frequency Wound #3 Left,Lateral Lower Leg o Change Dressing Monday, Wednesday, Friday - Beginning 10/21 Fridays  in clinic Wound #4 Left,Proximal,Lateral Lower Leg o Change Dressing Monday, Wednesday, Friday - Beginning 10/21 Fridays in clinic Wound #5 Left,Medial,Anterior Lower Leg o Change Dressing Monday, Wednesday, Friday - Beginning 10/21 Fridays in clinic Follow-up Appointments Wound #3 Left,Lateral Lower Leg o Return Appointment in 1 week. Wound #4 Left,Proximal,Lateral Lower Leg o Return Appointment in 1 week. Wound #5 Left,Medial,Anterior Lower Leg o Return Appointment in 1 week. Edema Control Wound #3 Left,Lateral Lower Leg o Patient to wear own compression stockings - Tubigrip in clinic Wound #4 Left,Proximal,Lateral Lower Leg o Patient to wear own compression stockings - Tubigrip in clinic Wound #5 Left,Medial,Anterior Lower Leg o Patient to wear own compression stockings - Tubigrip in clinic [place] #3 [place] Visits - [facility]- Patient will begin Friday Wound Care appointments on 10/21. o Home Health Nurse may visit PRN to address patientos wound care needs. o FACE TO FACE ENCOUNTER: MEDICARE and MEDICAID PATIENTS: I certify that this patient is under my care and that I had a face-to-face encounter that meets the physician face-to-face encounter requirements with this patient on this date. The encounter with the patient was in whole or in part for the following MEDICAL CONDITION: (primary reason for [place]) MEDICAL NECESSITY: I certify, that based on my findings, NURSING services are a medically necessary home health service. HOME BOUND STATUS: I certify that my clinical findings support that  this patient is homebound (i.e., Due to illness or injury, pt requires aid of supportive devices such as crutches, cane, wheelchairs, walkers, the use of special transportation or the assistance of another person to leave their place of residence. There is a normal inability to leave the home and doing so requires considerable and taxing effort. Other absences are for medical reasons / religious services and are infrequent or of short duration when for other reasons). o If current dressing causes regression in wound condition, may D/C ordered dressing product/s and apply Normal Saline Moist Dressing daily until next [place] / Other MD appointment. [place] of regression in wound condition at [phone]. o Please direct any NON-WOUND related issues/requests for orders to patient's Primary Care Physician DANIELA, HERNAN ([id number]) Wound #4 Left,Proximal,Lateral Lower Leg o [place] Visits - [facility]- Patient will begin Friday Wound Care appointments on 10/21. o Home Health Nurse may visit PRN to address patientos wound care needs. o FACE TO FACE ENCOUNTER: MEDICARE and MEDICAID PATIENTS: I certify that this patient is under my care and that I had a face-to-face encounter that meets the physician face-to-face encounter requirements with this patient on this date. The encounter with the patient was in whole or in part for the following MEDICAL CONDITION: (primary reason for [place]) MEDICAL NECESSITY: I certify, that based on my findings, NURSING services are a medically necessary home health service. HOME BOUND STATUS: I certify that my clinical findings support that this patient is homebound (i.e., Due to illness or injury, pt requires aid of supportive devices such as crutches, cane, wheelchairs, walkers, the use of special transportation or the assistance of another person to leave their place of residence. There is a normal  inability to leave the home and doing so requires considerable and taxing effort. Other absences are for medical reasons / religious services and are infrequent or of short duration when for other reasons). o If current dressing causes regression in wound condition, may D/C ordered dressing product/s and apply Normal Saline Moist Dressing  daily until next [place] / Other MD appointment. [place] of regression in wound condition at [phone]. o Please direct any NON-WOUND related issues/requests for orders to patient's Primary Care Physician Wound #5 Left,Medial,Anterior Lower Leg o [place] Visits - [facility]- Patient will begin Friday Wound Care appointments on 10/21. o Home Health Nurse may visit PRN to address patientos wound care needs. o FACE TO FACE ENCOUNTER: MEDICARE and MEDICAID PATIENTS: I certify that this patient is under my care and that I had a face-to-face encounter that meets the physician face-to-face encounter requirements with this patient on this date. The encounter with the patient was in whole or in part for the following MEDICAL CONDITION: (primary reason for [place]) MEDICAL NECESSITY: I certify, that based on my findings, NURSING services are a medically necessary home health service. HOME BOUND STATUS: I certify that my clinical findings support that this patient is homebound (i.e., Due to illness or injury, pt requires aid of supportive devices such as crutches, cane, wheelchairs, walkers, the use of special transportation or the assistance of another person to leave their place of residence. There is a normal inability to leave the home and doing so requires considerable and taxing effort. Other absences are for medical reasons / religious services and are infrequent or of short duration when for other reasons). o If current dressing causes regression in wound condition, may D/C ordered dressing  product/s and apply Normal Saline Moist Dressing daily until next [place] / Other MD appointment. [place] of regression in wound condition at [phone]. o Please direct any NON-WOUND related issues/requests for orders to patient's Primary Care Physician Electronic Signature(s) Signed: 01/30/2018 6:05:50 PM By: Gretta Cool, BSN, RN, CWS, Kim RN, BSN Signed: 02/03/2018 8:35:57 AM By: Worthy Keeler PA-C Entered By: Gretta Cool, BSN, RN, CWS, Kim on 01/30/2018 11:12:34 [name] ([id number]) -------------------------------------------------------------------------------- Problem List Details Patient Name: HENOK, HEACOCK T. Date of Service: 01/30/2018 10:15 AM Medical Record Number: [id number] Patient Account Number: [id number] Date of Birth/Sex: December 08, 1931 (84 y.o. M) Treating RN: Cornell Barman Primary Care Provider: Webb Silversmith Other Clinician: Referring Provider: Webb Silversmith Treating Provider/Extender: Melburn Hake, Syrita Dovel Weeks in Treatment: 7 Active Problems ICD-10 Evaluated Encounter Code Description Active Date Today Diagnosis I89.0 Lymphedema, not elsewhere classified 12/06/2017 No Yes L97.822 Non-pressure chronic ulcer of other part of left lower leg with 12/06/2017 No Yes fat layer exposed E11.622 Type 2 diabetes mellitus with other skin ulcer 12/06/2017 No Yes Z79.01 Long term (current) use of anticoagulants 12/06/2017 No Yes I50.42 Chronic combined systolic (congestive) and diastolic 2/63/7858 No Yes (congestive) heart failure J44.9 Chronic obstructive pulmonary disease, unspecified 12/06/2017 No Yes Inactive Problems Resolved Problems Electronic Signature(s) Signed: 02/03/2018 8:35:57 AM By: Worthy Keeler PA-C Entered By: Worthy Keeler on 01/30/2018 10:23:39 Signorelli, Gavin Pound ([id number]) -------------------------------------------------------------------------------- Progress Note Details Patient Name: [name] T. Date of Service:  01/30/2018 10:15 AM Medical Record Number: [id number] Patient Account Number: [id number] Date of Birth/Sex: Jan 23, 1932 (84 y.o. M) Treating RN: Cornell Barman Primary Care Provider: Webb Silversmith Other Clinician: Referring Provider: Webb Silversmith Treating Provider/Extender: Melburn Hake, Parke Jandreau Weeks in Treatment: 7 Subjective Chief Complaint Information obtained from Patient Left lateral LE ulcer History of Present Illness (HPI) The following HPI elements were documented for the patient's wound: Associated Signs and Symptoms: Patient has a history of lymphedema, type II diabetes mellitus, long-term use of Eliquis, congestive heart failure, and COPD. 12/06/17 on evaluation today patient is here for  evaluation of an ulcer on the left lateral lower extremity which has been present for roughly 2-3 months. Currently he has been using silver alginate and it was considered putting the patient in an [employer] wrap. However he did have arterial studies which actually showed to be fairly normal on the right however on the left he did have an ABI of 0.75. With that being said this ABI was consistent with at least mild-to-moderate arterial disease. The patient actually has some question on the test as to whether or not this result was actually even accurate his arterial disease could be more significant. For that reason I think he may need to see a vascular specialist to see about possibly undergoing an angiogram to see if arterial flow could be improved. With that being said it does appear that the patient rather desperately needs compression therapy he has significant edema of the bilateral lower extremities noted during evaluation today. No fevers, chills, nausea, or vomiting noted at this time. 12/16/17 on evaluation today patient actually appears to be doing better in regard to his lower extremity ulcers of the left lower extremity. Both wounds are significantly smaller and improved compared to the initial  evaluation last week. Overall I'm very pleased with the progress at this time. The patient likewise is pleased with how things appear. He states that he has thought about it and really does not want to have an angiogram that would prefer not to go see the vascular specialist which we had recommended last week. He states when he had this previous on top of the fact that he had issues with getting the groin area to stop bleeding he states that the die also seemed to cause issues in fact he states through him into having another heart attack. Obviously he has good reasons to want to try to avoid this which I completely understand. 01/02/18 on evaluation today patient actually appears to be showing signs of good improvement which is excellent news. He has been tolerating the dressing changes without complication. Unfortunately he was in the hospital due to an infection of his left forearm he was on IV antibiotics for six days. The good news is he seems to be doing much better in that regard his left lower extremity also is doing better at this point. There does not appear to be any evidence of infection at this time which is great news. Overall I'm very pleased with the progress he seems to be making. 01/30/18 patient has not been seen our office actually since 23 September. At that point following he apparently healed but then had several areas that we opened shortly following. He has new areas in three different spots on evaluation today. This is on the left lower extremity were the ones previously were. With that being said there does not appear to be any evidence of infection at this time which is good news. No fevers, chills, nausea, or vomiting noted at this time. Patient History Information obtained from Patient. Family History Diabetes - Maternal Grandparents, Heart Disease - Paternal Grandparents, Hypertension - Paternal Grandparents, Lung Disease - Mother, No family history of Cancer,  Hereditary Spherocytosis, Kidney Disease, Seizures, Stroke, Thyroid Problems, Tuberculosis. CLEVE, PAOLILLO ([id number]) Social History Former smoker - quit in 1993, Marital Status - Married, Alcohol Use - Never, Drug Use - No History, Caffeine Use - Daily. Medical And Surgical History Notes Neurologic CVA in 1999 Review of Systems (ROS) Constitutional Symptoms (General Health) Denies complaints or symptoms of Fever,  Chills, Marked Weight Change. Respiratory The patient has no complaints or symptoms. Cardiovascular Complains or has symptoms of LE edema. Psychiatric The patient has no complaints or symptoms. Objective Constitutional Well-nourished and well-hydrated in no acute distress. Vitals Time Taken: 10:25 AM, Height: 66 in, Weight: 182 lbs, BMI: 29.4, Temperature: 98.0 F, Pulse: 53 bpm, Respiratory Rate: 16 breaths/min, Blood Pressure: 107/50 mmHg. Respiratory normal breathing without difficulty. clear to auscultation bilaterally. Cardiovascular regular rate and rhythm with normal S1, S2. 2+ pitting edema of the bilateral lower extremities. Psychiatric this patient is able to make decisions and demonstrates good insight into disease process. Alert and Oriented x 3. pleasant and cooperative. General Notes: Patient's wound beds currently did not have any significant slough buildup that cannot be cleaned away easily with saline and gauze. This was performed today without complication post debridement the wound bed appears to be doing better he continues to have a significant amount of lower extremity edema at this time. Integumentary (Hair, Skin) Wound #2 status is Healed - Epithelialized. Original cause of wound was Gradually Appeared. The wound is located on the Left,Distal,Lateral Lower Leg. The wound measures 0cm length x 0cm width x 0cm depth; 0cm^2 area and 0cm^3 volume. Wound #3 status is Open. Original cause of wound was Not Known. The wound is located on the  Left,Lateral Lower Leg. The wound measures 3.5cm length x 1.3cm width x 0.1cm depth; 3.574cm^2 area and 0.357cm^3 volume. There is no tunneling or undermining noted. There is a medium amount of serosanguineous drainage noted. There is no granulation within the wound bed. There is a small (1-33%) amount of necrotic tissue within the wound bed including Adherent Slough. The periwound skin appearance had no abnormalities noted for texture. The periwound skin appearance did not exhibit: WESLIE, PRETLOW T. ([id number]) Dry/Scaly, Maceration, Atrophie Blanche, Cyanosis, Ecchymosis, Hemosiderin Staining, Mottled, Pallor, Rubor, Erythema. Wound #4 status is Open. Original cause of wound was Blister. The wound is located on the Left,Proximal,Lateral Lower Leg. The wound measures 0.9cm length x 0.6cm width x 0.1cm depth; 0.424cm^2 area and 0.042cm^3 volume. There is no tunneling or undermining noted. There is a small amount of serous drainage noted. There is no granulation within the wound bed. There is a small (1-33%) amount of necrotic tissue within the wound bed. The periwound skin appearance did not exhibit: Callus, Crepitus, Excoriation, Induration, Rash, Scarring, Dry/Scaly, Maceration, Atrophie Blanche, Cyanosis, Ecchymosis, Hemosiderin Staining, Mottled, Pallor, Rubor, Erythema. Wound #5 status is Open. Original cause of wound was Bite. The wound is located on the Left,Medial,Anterior Lower Leg. The wound measures 0.7cm length x 0.9cm width x 0.1cm depth; 0.495cm^2 area and 0.049cm^3 volume. There is no tunneling or undermining noted. There is a small amount of serous drainage noted. There is no granulation within the wound bed. There is a small (1-33%) amount of necrotic tissue within the wound bed. The periwound skin appearance did not exhibit: Callus, Crepitus, Excoriation, Induration, Rash, Scarring, Dry/Scaly, Maceration, Atrophie Blanche, Cyanosis, Ecchymosis, Hemosiderin Staining, Mottled,  Pallor, Rubor, Erythema. Assessment Active Problems ICD-10 Lymphedema, not elsewhere classified Non-pressure chronic ulcer of other part of left lower leg with fat layer exposed Type 2 diabetes mellitus with other skin ulcer Long term (current) use of anticoagulants Chronic combined systolic (congestive) and diastolic (congestive) heart failure Chronic obstructive pulmonary disease, unspecified Plan Wound Cleansing: Wound #3 Left,Lateral Lower Leg: Clean wound with Normal Saline. Wound #4 Left,Proximal,Lateral Lower Leg: Clean wound with Normal Saline. Wound #5 Left,Medial,Anterior Lower Leg: Clean wound with Normal Saline. Anesthetic (  add to Medication List): Wound #3 Left,Lateral Lower Leg: Topical Lidocaine 4% cream applied to wound bed prior to debridement (In Clinic Only). Wound #4 Left,Proximal,Lateral Lower Leg: Topical Lidocaine 4% cream applied to wound bed prior to debridement (In Clinic Only). Wound #5 Left,Medial,Anterior Lower Leg: Topical Lidocaine 4% cream applied to wound bed prior to debridement (In Clinic Only). Primary Wound Dressing: Wound #3 Left,Lateral Lower Leg: Silver Alginate Wound #4 Left,Proximal,Lateral Lower Leg: Silver Alginate Wound #5 Left,Medial,Anterior Lower Leg: TIMOTHEE, GALI ([id number]) Silver Alginate Secondary Dressing: Wound #3 Left,Lateral Lower Leg: ABD and Kerlix/Conform Wound #4 Left,Proximal,Lateral Lower Leg: ABD and Kerlix/Conform Wound #5 Left,Medial,Anterior Lower Leg: ABD and Kerlix/Conform Dressing Change Frequency: Wound #3 Left,Lateral Lower Leg: Change Dressing Monday, Wednesday, Friday - Monday in Clinic Wound #4 Left,Proximal,Lateral Lower Leg: Change Dressing Monday, Wednesday, Friday - Monday in Clinic Wound #5 Left,Medial,Anterior Lower Leg: Change Dressing Monday, Wednesday, Friday - Monday in Clinic Follow-up Appointments: Wound #3 Left,Lateral Lower Leg: Return Appointment in 1 week. Wound #4  Left,Proximal,Lateral Lower Leg: Return Appointment in 1 week. Wound #5 Left,Medial,Anterior Lower Leg: Return Appointment in 1 week. Edema Control: Wound #3 Left,Lateral Lower Leg: Patient to wear own compression stockings - Tubigrip in clinic Wound #4 Left,Proximal,Lateral Lower Leg: Patient to wear own compression stockings - Tubigrip in clinic Wound #5 Left,Medial,Anterior Lower Leg: Patient to wear own compression stockings - Tubigrip in clinic Home Health: Wound #3 Left,Lateral Lower Leg: [place] Nurse may visit PRN to address patient s wound care needs. FACE TO FACE ENCOUNTER: MEDICARE and MEDICAID PATIENTS: I certify that this patient is under my care and that I had a face-to-face encounter that meets the physician face-to-face encounter requirements with this patient on this date. The encounter with the patient was in whole or in part for the following MEDICAL CONDITION: (primary reason for [place]) MEDICAL NECESSITY: I certify, that based on my findings, NURSING services are a medically necessary home health service. HOME BOUND STATUS: I certify that my clinical findings support that this patient is homebound (i.e., Due to illness or injury, pt requires aid of supportive devices such as crutches, cane, wheelchairs, walkers, the use of special transportation or the assistance of another person to leave their place of residence. There is a normal inability to leave the home and doing so requires considerable and taxing effort. Other absences are for medical reasons / religious services and are infrequent or of short duration when for other reasons). If current dressing causes regression in wound condition, may D/C ordered dressing product/s and apply Normal Saline Moist Dressing daily until next [place] / Other MD appointment. [place] of regression in wound condition at [phone]. Please direct  any NON-WOUND related issues/requests for orders to patient's Primary Care Physician Wound #4 Left,Proximal,Lateral Lower Leg: [place] Nurse may visit PRN to address patient s wound care needs. FACE TO FACE ENCOUNTER: MEDICARE and MEDICAID PATIENTS: I certify that this patient is under my care and that I had a face-to-face encounter that meets the physician face-to-face encounter requirements with this patient on this date. The encounter with the patient was in whole or in part for the following MEDICAL CONDITION: (primary reason for [place]) MEDICAL NECESSITY: I certify, that based on my findings, NURSING services are a medically necessary home health service. HOME BOUND STATUS: I certify that my clinical findings support that this patient is homebound (i.e., Due  to illness or injury, pt requires aid of supportive devices such as crutches, cane, wheelchairs, walkers, the use of special transportation or the assistance of another person to leave their place of residence. There is a normal inability to leave the home and doing so requires considerable and taxing effort. Other absences are for medical reasons / religious services and are infrequent or of short duration when for other reasons). If current dressing causes regression in wound condition, may D/C ordered dressing product/s and apply Normal Saline Moist Dressing daily until next [place] / Other MD appointment. [place] of regression in [place], [place] ([id number]) wound condition at [phone]. Please direct any NON-WOUND related issues/requests for orders to patient's Primary Care Physician Wound #5 Left,Medial,Anterior Lower Leg: [place] Nurse may visit PRN to address patient s wound care needs. FACE TO FACE ENCOUNTER: MEDICARE and MEDICAID PATIENTS: I certify that this patient is under my care and that  I had a face-to-face encounter that meets the physician face-to-face encounter requirements with this patient on this date. The encounter with the patient was in whole or in part for the following MEDICAL CONDITION: (primary reason for [place]) MEDICAL NECESSITY: I certify, that based on my findings, NURSING services are a medically necessary home health service. HOME BOUND STATUS: I certify that my clinical findings support that this patient is homebound (i.e., Due to illness or injury, pt requires aid of supportive devices such as crutches, cane, wheelchairs, walkers, the use of special transportation or the assistance of another person to leave their place of residence. There is a normal inability to leave the home and doing so requires considerable and taxing effort. Other absences are for medical reasons / religious services and are infrequent or of short duration when for other reasons). If current dressing causes regression in wound condition, may D/C ordered dressing product/s and apply Normal Saline Moist Dressing daily until next [place] / Other MD appointment. [place] of regression in wound condition at [phone]. Please direct any NON-WOUND related issues/requests for orders to patient's Primary Care Physician At this point I'm gonna suggest that we go and continue with the dressing orders as before they were doing very well for him at that time. Patient is in agreement with that plan. If anything changes or worsens in the interim he will contact the office and let me know. Please see above for specific wound care orders. We will see patient for re-evaluation in 1 week(s) here in the clinic. If anything worsens or changes patient will contact our office for additional recommendations. Electronic Signature(s) Signed: 02/03/2018 8:35:57 AM By: Worthy Keeler PA-C Entered By: Worthy Keeler on 01/30/2018 11:11:24 [name]  ([id number]) -------------------------------------------------------------------------------- ROS/PFSH Details Patient Name: [name] T. Date of Service: 01/30/2018 10:15 AM Medical Record Number: [id number] Patient Account Number: [id number] Date of Birth/Sex: [birth year] (84 y.o. M) Treating RN: Cornell Barman Primary Care Provider: Webb Silversmith Other Clinician: Referring Provider: Webb Silversmith Treating Provider/Extender: Melburn Hake, Jahlil Ziller Weeks in Treatment: 7 Information Obtained From Patient Wound History Do you currently have one or more open woundso Yes How many open wounds do you currently haveo 1 Approximately how long have you had your woundso 2 months How have you been treating your wound(s) until nowo silver alginate Has your wound(s) ever healed and then re-openedo No Have you had any lab work done in the past montho Yes Who ordered the  lab work [place] PCP Have you tested positive for an antibiotic resistant organism (MRSA, VRE)o No Have you tested positive for osteomyelitis (bone infection)o No Have you had any tests for circulation on your legso No Have you had other problems associated with your woundso Infection, Swelling Constitutional Symptoms (General Health) Complaints and Symptoms: Negative for: Fever; Chills; Marked Weight Change Cardiovascular Complaints and Symptoms: Positive for: LE edema Medical History: Positive for: Arrhythmia - a fib; Congestive Heart Failure; Hypertension; Myocardial Infarction - 1997 Negative for: Angina; Coronary Artery Disease; Deep Vein Thrombosis; Hypotension; Peripheral Arterial Disease; Peripheral Venous Disease; Phlebitis; Vasculitis Eyes Medical History: Negative for: Cataracts; Glaucoma; Optic Neuritis Ear/Nose/Mouth/Throat Medical History: Negative for: Chronic sinus problems/congestion; Middle ear problems Hematologic/Lymphatic Medical History: Negative for: Anemia; Hemophilia; Human Immunodeficiency Virus;  Lymphedema; Sickle Cell Disease Respiratory Complaints and Symptoms: No Complaints or Symptoms PERL, KERNEY T. ([id number]) Medical History: Positive for: Chronic Obstructive Pulmonary Disease (COPD); Sleep Apnea - CPAP Negative for: Aspiration; Asthma; Pneumothorax; Tuberculosis Gastrointestinal Medical History: Negative for: Cirrhosis ; Colitis; Crohnos; Hepatitis A; Hepatitis B; Hepatitis C Endocrine Medical History: Positive for: Type II Diabetes Negative for: Type I Diabetes Treated with: Insulin, Oral agents Blood sugar tested every day: Yes Tested : QD Genitourinary Medical History: Negative for: End Stage Renal Disease Immunological Medical History: Negative for: Lupus Erythematosus; Raynaudos; Scleroderma Integumentary (Skin) Medical History: Negative for: History of Burn; History of pressure wounds Musculoskeletal Medical History: Positive for: Osteoarthritis Negative for: Gout; Rheumatoid Arthritis; Osteomyelitis Neurologic Medical History: Positive for: Neuropathy Negative for: Dementia; Quadriplegia; Paraplegia; Seizure Disorder Past Medical History Notes: CVA in [place] History: Positive for: Received Radiation - 5 treatments in chest Negative for: Received Chemotherapy Psychiatric Complaints and Symptoms: No Complaints or Symptoms Medical History: CHARLES, ANDRINGA ([id number]) Negative for: Anorexia/bulimia; Confinement Anxiety Immunizations Pneumococcal Vaccine: Received Pneumococcal Vaccination: Yes Implantable Devices Family and Social History Cancer: No; Diabetes: Yes - Maternal Grandparents; Heart Disease: Yes - Paternal Grandparents; Hereditary Spherocytosis: No; Hypertension: Yes - Paternal Grandparents; Kidney Disease: No; Lung Disease: Yes - Mother; Seizures: No; Stroke: No; Thyroid Problems: No; Tuberculosis: No; Former smoker - quit in 1993; Marital Status - Married; Alcohol Use: Never; Drug Use: No History; Caffeine  Use: Daily; Financial Concerns: No; Food, Clothing or Shelter Needs: No; Support System Lacking: No; Transportation Concerns: No; Advanced Directives: No; Patient does not want information on Advanced Directives Physician Affirmation I have reviewed and agree with the above information. Electronic Signature(s) Signed: 01/30/2018 6:05:50 PM By: Gretta Cool, BSN, RN, CWS, Kim RN, BSN Signed: 02/03/2018 8:35:57 AM By: Worthy Keeler PA-C Entered By: Worthy Keeler on 01/30/2018 11:10:11 Hunger, Gavin Pound ([id number]) -------------------------------------------------------------------------------- SuperBill Details Patient Name: [name] T. Date of Service: 01/30/2018 Medical Record Number: [id number] Patient Account Number: [id number] Date of Birth/Sex: Jul 09, 1931 (84 y.o. M) Treating RN: Cornell Barman Primary Care Provider: Webb Silversmith Other Clinician: Referring Provider: Webb Silversmith Treating Provider/Extender: Melburn Hake, Marajade Lei Weeks in Treatment: 7 Diagnosis Coding ICD-10 Codes Code Description I89.0 Lymphedema, not elsewhere classified L97.822 Non-pressure chronic ulcer of other part of left lower leg with fat layer exposed E11.622 Type 2 diabetes mellitus with other skin ulcer Z79.01 Long term (current) use of anticoagulants I50.42 Chronic combined systolic (congestive) and diastolic (congestive) heart failure J44.9 Chronic obstructive pulmonary disease, unspecified Facility Procedures CPT4 Code: 13244010 Description: 27253 - WOUND CARE VISIT-LEV 3 EST PT Modifier: Quantity: 1 Physician Procedures CPT4 Code Description: [id number] 74259 - WC PHYS LEVEL 3 - EST PT ICD-10 Diagnosis Description I89.0 Lymphedema,  not elsewhere classified L97.822 Non-pressure chronic ulcer of other part of left lower leg wit E11.622 Type 2 diabetes mellitus with other  skin ulcer Z79.01 Long term (current) use of anticoagulants Modifier: h fat layer expos Quantity: 1 ed Electronic  Signature(s) Signed: 02/03/2018 8:35:57 AM By: Worthy Keeler PA-C Entered By: Worthy Keeler on 01/30/2018 11:11:37

## 2018-02-07 ENCOUNTER — Telehealth: Payer: Self-pay

## 2018-02-07 ENCOUNTER — Telehealth: Payer: Self-pay | Admitting: Cardiovascular Disease

## 2018-02-07 DIAGNOSIS — E876 Hypokalemia: Secondary | ICD-10-CM

## 2018-02-07 DIAGNOSIS — Z79899 Other long term (current) drug therapy: Secondary | ICD-10-CM

## 2018-02-07 NOTE — Telephone Encounter (Signed)
Fax received from [facility] stating:  "Patient registers 7.4 # wt loss overnight. States weight peaked yesterday, and per MD protocol took usual dose of Metolazone with these results. This is the usual weight loss for his dose, and he denies any untoward symptoms; specifically no dizziness, no muscle cramping. Counseled to contact office with onset, take care upon rising and hesitate/ acclimate prior to walking."  Nurse Contact: Leta Jungling Phone- [phone] ext- [phone] Fax- [phone]  To Dr. Rockey Situ to review.

## 2018-02-07 NOTE — Telephone Encounter (Signed)
I spoke with Elta Guadeloupe PT asst with [facility]; Elta Guadeloupe said the pt's BP was monitored for 45' and did go back up at one pt to 116/70.pt was not having symptoms; no H/A dizziness or lightheaded. Pt did take metolazone on 02/06/18 and lost 6 lbs in one day. Elta Guadeloupe advised pt to drink fluids and be careful when up moving around. FYI to Avie Echevaria NP.

## 2018-02-07 NOTE — Telephone Encounter (Signed)
Noted, BP typically drops when he takes Metolazone. Continue to monitor BP. Let me know if it is persistently low.

## 2018-02-07 NOTE — Telephone Encounter (Signed)
Copied from [place] [id number]. Topic: General - Other >> Feb 07, 2018  1:03 PM Yvette Rack wrote: Reason for CRM: [place] Deck a Physical Therapist Assistant with [facility] states pt has been experiencing low blood pressure (79/58). Elta Guadeloupe states pt has been instructed to not walk around. Elta Guadeloupe also stated that he just wanted to notify pt doctor of the low blood pressure. Cb# [phone]

## 2018-02-07 NOTE — Telephone Encounter (Signed)
1) Are you calling to confirm a diagnosis or obtain personal health information (Y/N)? No   2) If so, what information is requested?   Fax to follow of CHF observations per [facility]   Patient lost 7.4 lbs overnight after taking metolazone yesterday   Patient denies symptoms no dizziness no leg cramps no sob

## 2018-02-07 NOTE — Telephone Encounter (Signed)
We need a basic metabolic panel Most recent potassium 3.3 6 weeks ago was less than 3 Can we confirm how much potassium he is taking when he takes torsemide 40 twice daily? How much potassium when he takes metolazone?

## 2018-02-08 NOTE — Telephone Encounter (Signed)
I called and spoke with the patient. I advised him to please go to the [place] at [facility] for a lab draw today. He is unsure if he can get a ride, but he may be able to get there tomorrow. I have advised a soon as he could do this we would appreciate it to re-evaluate where his potassium level is.  He is aware labs at [facility] are on a walk-in basis. Again urged that today is preferred, but if not, then tomorrow. The patient voices understanding and is agreeable.

## 2018-02-08 NOTE — Telephone Encounter (Signed)
Per the patient he reports taking 60 meq of potassium BID and no extra when taking metolazone.  Patient is agreeable to come in for labs, not sure if appointment is needed.

## 2018-02-09 ENCOUNTER — Encounter: Payer: Self-pay | Admitting: Internal Medicine

## 2018-02-09 ENCOUNTER — Other Ambulatory Visit
Admission: RE | Admit: 2018-02-09 | Discharge: 2018-02-09 | Disposition: A | Discharge status: Home / Self Care | Payer: Medicare Other | Source: Ambulatory Visit | Attending: Cardiovascular Disease | Admitting: Cardiovascular Disease

## 2018-02-09 DIAGNOSIS — Z79899 Other long term (current) drug therapy: Secondary | ICD-10-CM | POA: Insufficient documentation

## 2018-02-09 DIAGNOSIS — E876 Hypokalemia: Secondary | ICD-10-CM | POA: Diagnosis present

## 2018-02-09 LAB — BASIC METABOLIC PANEL
ANION GAP: 11 (ref 5–15)
BUN: 48 mg/dL — ABNORMAL HIGH (ref 8–23)
CALCIUM: 9.1 mg/dL (ref 8.9–10.3)
CHLORIDE: 96 mmol/L — AB (ref 98–111)
CO2: 34 mmol/L — ABNORMAL HIGH (ref 22–32)
CREATININE: 1.82 mg/dL — AB (ref 0.61–1.24)
GFR calc non Af Amer: 32 mL/min — ABNORMAL LOW (ref >60)
GFR, EST AFRICAN AMERICAN: 38 mL/min — AB (ref >60)
Glucose, Bld: 135 mg/dL — ABNORMAL HIGH (ref 70–99)
Potassium: 3.3 mmol/L — ABNORMAL LOW (ref 3.5–5.1)
SODIUM: 141 mmol/L (ref 135–145)

## 2018-02-09 MED ORDER — POTASSIUM CHLORIDE CRYS ER 20 MEQ PO TBCR: EXTENDED_RELEASE_TABLET | ORAL | Status: DC

## 2018-02-09 NOTE — Telephone Encounter (Signed)
Reviewed the patient's labs with Dr. Rockey Situ.  Orders received to: 1) increase potassium 20 meq- take 4 tablets (80 meq) by mouth twice daily, &  On days he take metolazone- take an extra 20 meq once daily  Renal function slightly elevated today- per Dr. Rockey Situ- ok to run 1-2 lbs above what his weight is today to preserve his kidneys.  I have spoken with the patient regarding the above recommendations. He voices understanding and is agreeable with all of the above. He is due to follow up with Dr. Rockey Situ- appt made for 02/21/18. Will check a BMP at that visit.

## 2018-02-09 NOTE — Addendum Note (Signed)
Addended by: Alvis Lemmings C on: 02/09/2018 04:18 PM   Modules accepted: Orders

## 2018-02-09 NOTE — Telephone Encounter (Addendum)
BMP done today- this has resulted.  K+- 3.3 BUN/ Creatinine- 48/1.82  To Dr. Rockey Situ to review.

## 2018-02-10 ENCOUNTER — Encounter: Payer: Self-pay | Admitting: Internal Medicine

## 2018-02-10 ENCOUNTER — Encounter: Payer: Medicare Other | Attending: Physician Assistant | Admitting: Physician Assistant

## 2018-02-10 ENCOUNTER — Telehealth: Payer: Self-pay | Admitting: Cardiovascular Disease

## 2018-02-10 DIAGNOSIS — E11622 Type 2 diabetes mellitus with other skin ulcer: Secondary | ICD-10-CM | POA: Diagnosis not present

## 2018-02-10 DIAGNOSIS — G4730 Sleep apnea, unspecified: Secondary | ICD-10-CM | POA: Diagnosis not present

## 2018-02-10 DIAGNOSIS — I110 Hypertensive heart disease with heart failure: Secondary | ICD-10-CM | POA: Insufficient documentation

## 2018-02-10 DIAGNOSIS — M1990 Unspecified osteoarthritis, unspecified site: Secondary | ICD-10-CM | POA: Insufficient documentation

## 2018-02-10 DIAGNOSIS — E1140 Type 2 diabetes mellitus with diabetic neuropathy, unspecified: Secondary | ICD-10-CM | POA: Insufficient documentation

## 2018-02-10 DIAGNOSIS — I5042 Chronic combined systolic (congestive) and diastolic (congestive) heart failure: Secondary | ICD-10-CM | POA: Insufficient documentation

## 2018-02-10 DIAGNOSIS — Z87891 Personal history of nicotine dependence: Secondary | ICD-10-CM | POA: Diagnosis not present

## 2018-02-10 DIAGNOSIS — L97822 Non-pressure chronic ulcer of other part of left lower leg with fat layer exposed: Secondary | ICD-10-CM | POA: Diagnosis not present

## 2018-02-10 DIAGNOSIS — Z8673 Personal history of transient ischemic attack (TIA), and cerebral infarction without residual deficits: Secondary | ICD-10-CM | POA: Diagnosis not present

## 2018-02-10 DIAGNOSIS — J449 Chronic obstructive pulmonary disease, unspecified: Secondary | ICD-10-CM | POA: Diagnosis not present

## 2018-02-10 DIAGNOSIS — Z79899 Other long term (current) drug therapy: Secondary | ICD-10-CM | POA: Insufficient documentation

## 2018-02-10 DIAGNOSIS — I252 Old myocardial infarction: Secondary | ICD-10-CM | POA: Insufficient documentation

## 2018-02-10 DIAGNOSIS — Z7901 Long term (current) use of anticoagulants: Secondary | ICD-10-CM | POA: Diagnosis not present

## 2018-02-10 DIAGNOSIS — I890 Lymphedema, not elsewhere classified: Secondary | ICD-10-CM | POA: Diagnosis not present

## 2018-02-10 DIAGNOSIS — I4891 Unspecified atrial fibrillation: Secondary | ICD-10-CM | POA: Insufficient documentation

## 2018-02-10 DIAGNOSIS — Z794 Long term (current) use of insulin: Secondary | ICD-10-CM | POA: Insufficient documentation

## 2018-02-10 MED ORDER — POTASSIUM CHLORIDE CRYS ER 20 MEQ PO TBCR: EXTENDED_RELEASE_TABLET | ORAL | 3 refills | Status: DC

## 2018-02-10 NOTE — Telephone Encounter (Signed)
Called patient. He said he needs a refill for his potassium. He tried to get it from his PCP but they advised for him to contact us. He would like a 90-day supply sent to [facility]. Rx sent. He was so appreciative.

## 2018-02-10 NOTE — Telephone Encounter (Signed)
Please call regarding Potassium chloride ER increase.

## 2018-02-14 NOTE — Progress Notes (Signed)
BORUCH, MANUELE ([id number]) Visit Report for 02/10/2018 Chief Complaint Document Details Patient Name: [name], [name]. Date of Service: 02/10/2018 9:30 AM Medical Record Number: [id number] Patient Account Number: [id number] Date of Birth/Sex: 11-Dec-1931 (84 y.o. M) Treating RN: Montey Hora Primary Care Provider: Webb Silversmith Other Clinician: Referring Provider: Webb Silversmith Treating Provider/Extender: Melburn Hake, HOYT Weeks in Treatment: 9 Information Obtained from: Patient Chief Complaint Left lateral LE ulcer Electronic Signature(s) Signed: 02/13/2018 9:27:58 AM By: Worthy Keeler PA-C Entered By: Worthy Keeler on 02/10/2018 09:32:03 Wragg, Gavin Pound ([id number]) -------------------------------------------------------------------------------- HPI Details Patient Name: [name] T. Date of Service: 02/10/2018 9:30 AM Medical Record Number: [id number] Patient Account Number: [id number] Date of Birth/Sex: 30-Oct-1931 (84 y.o. M) Treating RN: Montey Hora Primary Care Provider: Webb Silversmith Other Clinician: Referring Provider: Webb Silversmith Treating Provider/Extender: Melburn Hake, HOYT Weeks in Treatment: 9 History of Present Illness Associated Signs and Symptoms: Patient has a history of lymphedema, type II diabetes mellitus, long-term use of Eliquis, congestive heart failure, and COPD. HPI Description: 12/06/17 on evaluation today patient is here for evaluation of an ulcer on the left lateral lower extremity which has been present for roughly 2-3 months. Currently he has been using silver alginate and it was considered putting the patient in an [employer] wrap. However he did have arterial studies which actually showed to be fairly normal on the right however on the left he did have an ABI of 0.75. With that being said this ABI was consistent with at least mild-to-moderate arterial disease. The patient actually has some question on the test as to whether or not this  result was actually even accurate his arterial disease could be more significant. For that reason I think he may need to see a vascular specialist to see about possibly undergoing an angiogram to see if arterial flow could be improved. With that being said it does appear that the patient rather desperately needs compression therapy he has significant edema of the bilateral lower extremities noted during evaluation today. No fevers, chills, nausea, or vomiting noted at this time. 12/16/17 on evaluation today patient actually appears to be doing better in regard to his lower extremity ulcers of the left lower extremity. Both wounds are significantly smaller and improved compared to the initial evaluation last week. Overall I'm very pleased with the progress at this time. The patient likewise is pleased with how things appear. He states that he has thought about it and really does not want to have an angiogram that would prefer not to go see the vascular specialist which we had recommended last week. He states when he had this previous on top of the fact that he had issues with getting the groin area to stop bleeding he states that the die also seemed to cause issues in fact he states through him into having another heart attack. Obviously he has good reasons to want to try to avoid this which I completely understand. 01/02/18 on evaluation today patient actually appears to be showing signs of good improvement which is excellent news. He has been tolerating the dressing changes without complication. Unfortunately he was in the hospital due to an infection of his left forearm he was on IV antibiotics for six days. The good news is he seems to be doing much better in that regard his left lower extremity also is doing better at this point. There does not appear to be any evidence of infection at this time which is great news. Overall I'm  very pleased with the progress he seems to be making. 01/30/18 patient  has not been seen our office actually since 23 September. At that point following he apparently healed but then had several areas that we opened shortly following. He has new areas in three different spots on evaluation today. This is on the left lower extremity were the ones previously were. With that being said there does not appear to be any evidence of infection at this time which is good news. No fevers, chills, nausea, or vomiting noted at this time. 02/10/18 on evaluation today patient actually appears to be doing excellent in regard to his lower extremities. He just has one wound that appears to still be open at this time. There does not appear to be any evidence of infection currently. With that being said he has been tolerating the compression that we been utilizing without complication. No fevers, chills, nausea, or vomiting noted at this time. Electronic Signature(s) Signed: 02/13/2018 9:27:58 AM By: Worthy Keeler PA-C Entered By: Worthy Keeler on 02/10/2018 13:14:16 Malkowski, Gavin Pound ([id number]) -------------------------------------------------------------------------------- Physical Exam Details Patient Name: [name] T. Date of Service: 02/10/2018 9:30 AM Medical Record Number: [id number] Patient Account Number: [id number] Date of Birth/Sex: October 25, 1931 (84 y.o. M) Treating RN: Montey Hora Primary Care Provider: Webb Silversmith Other Clinician: Referring Provider: Webb Silversmith Treating Provider/Extender: STONE III, HOYT Weeks in Treatment: 9 Constitutional Well-nourished and well-hydrated in no acute distress. Respiratory normal breathing without difficulty. clear to auscultation bilaterally. Cardiovascular regular rate and rhythm with normal S1, S2. 1+ pitting edema of the bilateral lower extremities. Psychiatric this patient is able to make decisions and demonstrates good insight into disease process. Alert and Oriented x 3. pleasant and  cooperative. Notes Patient's wound bed currently shows evidence of good granulation. There does not appear to be any evidence of infection fortunately. With that being said overall I'm very pleased with how the patient's lower extremities appear today. His swelling seems to be fairly well controlled in my pinion. Electronic Signature(s) Signed: 02/13/2018 9:27:58 AM By: Worthy Keeler PA-C Entered By: Worthy Keeler on 02/10/2018 13:15:12 Hanselman, Gavin Pound ([id number]) -------------------------------------------------------------------------------- Physician Orders Details Patient Name: [name] T. Date of Service: 02/10/2018 9:30 AM Medical Record Number: [id number] Patient Account Number: [id number] Date of Birth/Sex: December 25, 1931 (84 y.o. M) Treating RN: Montey Hora Primary Care Provider: Webb Silversmith Other Clinician: Referring Provider: Webb Silversmith Treating Provider/Extender: Melburn Hake, HOYT Weeks in Treatment: 9 Verbal / Phone Orders: No Diagnosis Coding ICD-10 Coding Code Description I89.0 Lymphedema, not elsewhere classified L97.822 Non-pressure chronic ulcer of other part of left lower leg with fat layer exposed E11.622 Type 2 diabetes mellitus with other skin ulcer Z79.01 Long term (current) use of anticoagulants I50.42 Chronic combined systolic (congestive) and diastolic (congestive) heart failure J44.9 Chronic obstructive pulmonary disease, unspecified Wound Cleansing Wound #3 Left,Lateral Lower Leg o Clean wound with Normal Saline. Anesthetic (add to Medication List) Wound #3 Left,Lateral Lower Leg o Topical Lidocaine 4% cream applied to wound bed prior to debridement (In Clinic Only). Primary Wound Dressing Wound #3 Left,Lateral Lower Leg o Silver Alginate Secondary Dressing Wound #3 Left,Lateral Lower Leg o Gauze and Kerlix/Conform Dressing Change Frequency Wound #3 Left,Lateral Lower Leg o Change Dressing Monday, Wednesday, Friday - Patient  is going to return to clinic in 2 weeks so on weeks when patient is not seen in clinic, [facility] to visit patient 3 Monday, Wednesday and Friday; other weeks Monday and Wednesday Follow-up Appointments Wound #3 Left,Lateral  Lower Leg o Return Appointment in 2 weeks. - Patient is going to return to clinic in 2 weeks so on weeks when patient is not seen in clinic, [facility] to visit patient 3 Monday, Wednesday and Friday; other weeks Monday and Wednesday Edema Control Wound #3 Left,Lateral Lower Leg o Patient to wear own compression stockings - Tubigrip in clinic NORA, ROOKE ([id number]) [place] [facility] [place] Visits - [facility]- Patient is going to return to clinic in 2 weeks so on weeks when patient is not seen in clinic, [facility] to visit patient 3 Monday, Wednesday and Friday; other weeks Monday and Wednesday o Home Health Nurse may visit PRN to address patientos wound care needs. o FACE TO FACE ENCOUNTER: MEDICARE and MEDICAID PATIENTS: I certify that this patient is under my care and that I had a face-to-face encounter that meets the physician face-to-face encounter requirements with this patient on this date. The encounter with the patient was in whole or in part for the following MEDICAL CONDITION: (primary reason for [place]) MEDICAL NECESSITY: I certify, that based on my findings, NURSING services are a medically necessary home health service. HOME BOUND STATUS: I certify that my clinical findings support that this patient is homebound (i.e., Due to illness or injury, pt requires aid of supportive devices such as crutches, cane, wheelchairs, walkers, the use of special transportation or the assistance of another person to leave their place of residence. There is a normal inability to leave the home and doing so requires considerable and taxing effort. Other absences are for medical reasons / religious services and are infrequent  or of short duration when for other reasons). o If current dressing causes regression in wound condition, may D/C ordered dressing product/s and apply Normal Saline Moist Dressing daily until next [place] / Other MD appointment. [place] of regression in wound condition at [phone]. o Please direct any NON-WOUND related issues/requests for orders to patient's Primary Care Physician Electronic Signature(s) Signed: 02/10/2018 5:38:25 PM By: Montey Hora Signed: 02/13/2018 9:27:58 AM By: Worthy Keeler PA-C Entered By: Montey Hora on 02/10/2018 10:37:42 Hara, Gavin Pound ([id number]) -------------------------------------------------------------------------------- Problem List Details Patient Name: DASTAN, Shawn T. Date of Service: 02/10/2018 9:30 AM Medical Record Number: [id number] Patient Account Number: [id number] Date of Birth/Sex: November 10, 1931 (84 y.o. M) Treating RN: Montey Hora Primary Care Provider: Webb Silversmith Other Clinician: Referring Provider: Webb Silversmith Treating Provider/Extender: Melburn Hake, HOYT Weeks in Treatment: 9 Active Problems ICD-10 Evaluated Encounter Code Description Active Date Today Diagnosis I89.0 Lymphedema, not elsewhere classified 12/06/2017 No Yes L97.822 Non-pressure chronic ulcer of other part of left lower leg with 12/06/2017 No Yes fat layer exposed E11.622 Type 2 diabetes mellitus with other skin ulcer 12/06/2017 No Yes Z79.01 Long term (current) use of anticoagulants 12/06/2017 No Yes I50.42 Chronic combined systolic (congestive) and diastolic 9/93/5701 No Yes (congestive) heart failure J44.9 Chronic obstructive pulmonary disease, unspecified 12/06/2017 No Yes Inactive Problems Resolved Problems Electronic Signature(s) Signed: 02/13/2018 9:27:58 AM By: Worthy Keeler PA-C Entered By: Worthy Keeler on 02/10/2018 09:31:58 Califf, Gavin Pound  ([id number]) -------------------------------------------------------------------------------- Progress Note Details Patient Name: [name] T. Date of Service: 02/10/2018 9:30 AM Medical Record Number: [id number] Patient Account Number: [id number] Date of Birth/Sex: [birth year] (84 y.o. M) Treating RN: Montey Hora Primary Care Provider: Webb Silversmith Other Clinician: Referring Provider: Webb Silversmith Treating Provider/Extender: Melburn Hake, HOYT Weeks in Treatment: 9 Subjective Chief Complaint Information obtained from Patient Left  lateral LE ulcer History of Present Illness (HPI) The following HPI elements were documented for the patient's wound: Associated Signs and Symptoms: Patient has a history of lymphedema, type II diabetes mellitus, long-term use of Eliquis, congestive heart failure, and COPD. 12/06/17 on evaluation today patient is here for evaluation of an ulcer on the left lateral lower extremity which has been present for roughly 2-3 months. Currently he has been using silver alginate and it was considered putting the patient in an [employer] wrap. However he did have arterial studies which actually showed to be fairly normal on the right however on the left he did have an ABI of 0.75. With that being said this ABI was consistent with at least mild-to-moderate arterial disease. The patient actually has some question on the test as to whether or not this result was actually even accurate his arterial disease could be more significant. For that reason I think he may need to see a vascular specialist to see about possibly undergoing an angiogram to see if arterial flow could be improved. With that being said it does appear that the patient rather desperately needs compression therapy he has significant edema of the bilateral lower extremities noted during evaluation today. No fevers, chills, nausea, or vomiting noted at this time. 12/16/17 on evaluation today patient actually  appears to be doing better in regard to his lower extremity ulcers of the left lower extremity. Both wounds are significantly smaller and improved compared to the initial evaluation last week. Overall I'm very pleased with the progress at this time. The patient likewise is pleased with how things appear. He states that he has thought about it and really does not want to have an angiogram that would prefer not to go see the vascular specialist which we had recommended last week. He states when he had this previous on top of the fact that he had issues with getting the groin area to stop bleeding he states that the die also seemed to cause issues in fact he states through him into having another heart attack. Obviously he has good reasons to want to try to avoid this which I completely understand. 01/02/18 on evaluation today patient actually appears to be showing signs of good improvement which is excellent news. He has been tolerating the dressing changes without complication. Unfortunately he was in the hospital due to an infection of his left forearm he was on IV antibiotics for six days. The good news is he seems to be doing much better in that regard his left lower extremity also is doing better at this point. There does not appear to be any evidence of infection at this time which is great news. Overall I'm very pleased with the progress he seems to be making. 01/30/18 patient has not been seen our office actually since 23 September. At that point following he apparently healed but then had several areas that we opened shortly following. He has new areas in three different spots on evaluation today. This is on the left lower extremity were the ones previously were. With that being said there does not appear to be any evidence of infection at this time which is good news. No fevers, chills, nausea, or vomiting noted at this time. 02/10/18 on evaluation today patient actually appears to be doing  excellent in regard to his lower extremities. He just has one wound that appears to still be open at this time. There does not appear to be any evidence of infection currently.  With that being said he has been tolerating the compression that we been utilizing without complication. No fevers, chills, nausea, or vomiting noted at this time. Patient History Information obtained from Patient. ZALE, MARCOTTE ([id number]) Family History Diabetes - Maternal Grandparents, Heart Disease - Paternal Grandparents, Hypertension - Paternal Grandparents, Lung Disease - Mother, No family history of Cancer, Hereditary Spherocytosis, Kidney Disease, Seizures, Stroke, Thyroid Problems, Tuberculosis. Social History Former smoker - quit in 1993, Marital Status - Married, Alcohol Use - Never, Drug Use - No History, Caffeine Use - Daily. Medical And Surgical History Notes Neurologic CVA in 1999 Review of Systems (ROS) Constitutional Symptoms (General Health) Denies complaints or symptoms of Fever, Chills. Hematologic/Lymphatic The patient has no complaints or symptoms. Respiratory The patient has no complaints or symptoms. Psychiatric The patient has no complaints or symptoms. Objective Constitutional Well-nourished and well-hydrated in no acute distress. Vitals Time Taken: 9:43 AM, Height: 66 in, Weight: 182 lbs, BMI: 29.4, Temperature: 97.7 F, Pulse: 62 bpm, Respiratory Rate: 16 breaths/min, Blood Pressure: 116/51 mmHg. Respiratory normal breathing without difficulty. clear to auscultation bilaterally. Cardiovascular regular rate and rhythm with normal S1, S2. 1+ pitting edema of the bilateral lower extremities. Psychiatric this patient is able to make decisions and demonstrates good insight into disease process. Alert and Oriented x 3. pleasant and cooperative. General Notes: Patient's wound bed currently shows evidence of good granulation. There does not appear to be any evidence of infection  fortunately. With that being said overall I'm very pleased with how the patient's lower extremities appear today. His swelling seems to be fairly well controlled in my pinion. Integumentary (Hair, Skin) Wound #3 status is Open. Original cause of wound was Not Known. The wound is located on the Left,Lateral Lower Leg. The wound measures 1.8cm length x 0.8cm width x 0.1cm depth; 1.131cm^2 area and 0.113cm^3 volume. There is no tunneling or undermining noted. There is a small amount of serosanguineous drainage noted. The wound margin is flat and intact. ALGIS, LEHENBAUER T. ([id number]) There is no granulation within the wound bed. There is a small (1-33%) amount of necrotic tissue within the wound bed including Adherent Slough. The periwound skin appearance had no abnormalities noted for texture. The periwound skin appearance did not exhibit: Dry/Scaly, Maceration, Atrophie Blanche, Cyanosis, Ecchymosis, Hemosiderin Staining, Mottled, Pallor, Rubor, Erythema. Wound #4 status is Healed - Epithelialized. Original cause of wound was Blister. The wound is located on the Left,Proximal,Lateral Lower Leg. The wound measures 0cm length x 0cm width x 0cm depth; 0cm^2 area and 0cm^3 volume. There is no tunneling or undermining noted. There is a none present amount of drainage noted. There is no granulation within the wound bed. There is no necrotic tissue within the wound bed. The periwound skin appearance did not exhibit: Callus, Crepitus, Excoriation, Induration, Rash, Scarring, Dry/Scaly, Maceration, Atrophie Blanche, Cyanosis, Ecchymosis, Hemosiderin Staining, Mottled, Pallor, Rubor, Erythema. Wound #5 status is Healed - Epithelialized. Original cause of wound was Bite. The wound is located on the Left,Medial,Anterior Lower Leg. The wound measures 0cm length x 0cm width x 0cm depth; 0cm^2 area and 0cm^3 volume. There is no tunneling or undermining noted. There is a none present amount of drainage noted.  There is no granulation within the wound bed. There is no necrotic tissue within the wound bed. The periwound skin appearance did not exhibit: Callus, Crepitus, Excoriation, Induration, Rash, Scarring, Dry/Scaly, Maceration, Atrophie Blanche, Cyanosis, Ecchymosis, Hemosiderin Staining, Mottled, Pallor, Rubor, Erythema. Assessment Active Problems ICD-10 Lymphedema, not elsewhere classified Non-pressure chronic  ulcer of other part of left lower leg with fat layer exposed Type 2 diabetes mellitus with other skin ulcer Long term (current) use of anticoagulants Chronic combined systolic (congestive) and diastolic (congestive) heart failure Chronic obstructive pulmonary disease, unspecified Plan Wound Cleansing: Wound #3 Left,Lateral Lower Leg: Clean wound with Normal Saline. Anesthetic (add to Medication List): Wound #3 Left,Lateral Lower Leg: Topical Lidocaine 4% cream applied to wound bed prior to debridement (In Clinic Only). Primary Wound Dressing: Wound #3 Left,Lateral Lower Leg: Silver Alginate Secondary Dressing: Wound #3 Left,Lateral Lower Leg: Gauze and Kerlix/Conform Dressing Change Frequency: Wound #3 Left,Lateral Lower Leg: Change Dressing Monday, Wednesday, Friday - Patient is going to return to clinic in 2 weeks so on weeks when patient is not seen in clinic, [facility] to visit patient 3 Monday, Wednesday and Friday; other weeks Monday and Wednesday Follow-up Appointments: MAHKI, SPIKES T. ([id number]) Wound #3 Left,Lateral Lower Leg: Return Appointment in 2 weeks. - Patient is going to return to clinic in 2 weeks so on weeks when patient is not seen in clinic, [facility] to visit patient 3 Monday, Wednesday and Friday; other weeks Monday and Wednesday Edema Control: Wound #3 Left,Lateral Lower Leg: Patient to wear own compression stockings - Tubigrip in clinic Home Health: Wound #3 Left,Lateral Lower Leg: Continue Home Health Visits - [facility]- Patient is going to return to  clinic in 2 weeks so on weeks when patient is not seen in clinic, [facility] to visit patient 3 Monday, Wednesday and Friday; other weeks Monday and Wednesday Home Health Nurse may visit PRN to address patient s wound care needs. FACE TO FACE ENCOUNTER: MEDICARE and MEDICAID PATIENTS: I certify that this patient is under my care and that I had a face-to-face encounter that meets the physician face-to-face encounter requirements with this patient on this date. The encounter with the patient was in whole or in part for the following MEDICAL CONDITION: (primary reason for [place]) MEDICAL NECESSITY: I certify, that based on my findings, NURSING services are a medically necessary home health service. HOME BOUND STATUS: I certify that my clinical findings support that this patient is homebound (i.e., Due to illness or injury, pt requires aid of supportive devices such as crutches, cane, wheelchairs, walkers, the use of special transportation or the assistance of another person to leave their place of residence. There is a normal inability to leave the home and doing so requires considerable and taxing effort. Other absences are for medical reasons / religious services and are infrequent or of short duration when for other reasons). If current dressing causes regression in wound condition, may D/C ordered dressing product/s and apply Normal Saline Moist Dressing daily until next [place] / Other MD appointment. [place] of regression in wound condition at [phone]. Please direct any NON-WOUND related issues/requests for orders to patient's Primary Care Physician We will at this point continue with the above wound care measures for the next week. The patient is in agreement with plan. If anything changes or worsens in the interim he will contact the office and let us know otherwise will see were things stand at follow-up. Please see above for specific wound care  orders. We will see patient for re-evaluation in 2 week(s) here in the clinic. If anything worsens or changes patient will contact our office for additional recommendations. Electronic Signature(s) Signed: 02/13/2018 9:27:58 AM By: Worthy Keeler PA-C Entered By: Worthy Keeler on 02/10/2018 13:19:14 Mancuso, Gavin Pound ([id number]) -------------------------------------------------------------------------------- ROS/PFSH Details Patient  Name: [name], [name]. Date of Service: 02/10/2018 9:30 AM Medical Record Number: [id number] Patient Account Number: [id number] Date of Birth/Sex: 11/22/1931 (84 y.o. M) Treating RN: Montey Hora Primary Care Provider: Webb Silversmith Other Clinician: Referring Provider: Webb Silversmith Treating Provider/Extender: Melburn Hake, HOYT Weeks in Treatment: 9 Information Obtained From Patient Wound History Do you currently have one or more open woundso Yes How many open wounds do you currently haveo 1 Approximately how long have you had your woundso 2 months How have you been treating your wound(s) until nowo silver alginate Has your wound(s) ever healed and then re-openedo No Have you had any lab work done in the past montho Yes Who ordered the lab work doneo PCP Have you tested positive for an antibiotic resistant organism (MRSA, VRE)o No Have you tested positive for osteomyelitis (bone infection)o No Have you had any tests for circulation on your legso No Have you had other problems associated with your woundso Infection, Swelling Constitutional Symptoms (General Health) Complaints and Symptoms: Negative for: Fever; Chills Eyes Medical History: Negative for: Cataracts; Glaucoma; Optic Neuritis Ear/Nose/Mouth/Throat Medical History: Negative for: Chronic sinus problems/congestion; Middle ear problems Hematologic/Lymphatic Complaints and Symptoms: No Complaints or Symptoms Medical History: Negative for: Anemia; Hemophilia; Human Immunodeficiency Virus;  Lymphedema; Sickle Cell Disease Respiratory Complaints and Symptoms: No Complaints or Symptoms Medical History: Positive for: Chronic Obstructive Pulmonary Disease (COPD); Sleep Apnea - CPAP Negative for: Aspiration; Asthma; Pneumothorax; Tuberculosis Cardiovascular HURIEL, MATT T. ([id number]) Medical History: Positive for: Arrhythmia - a fib; Congestive Heart Failure; Hypertension; Myocardial Infarction - 1997 Negative for: Angina; Coronary Artery Disease; Deep Vein Thrombosis; Hypotension; Peripheral Arterial Disease; Peripheral Venous Disease; Phlebitis; Vasculitis Gastrointestinal Medical History: Negative for: Cirrhosis ; Colitis; Crohnos; Hepatitis A; Hepatitis B; Hepatitis C Endocrine Medical History: Positive for: Type II Diabetes Negative for: Type I Diabetes Treated with: Insulin, Oral agents Blood sugar tested every day: Yes Tested : QD Genitourinary Medical History: Negative for: End Stage Renal Disease Immunological Medical History: Negative for: Lupus Erythematosus; Raynaudos; Scleroderma Integumentary (Skin) Medical History: Negative for: History of Burn; History of pressure wounds Musculoskeletal Medical History: Positive for: Osteoarthritis Negative for: Gout; Rheumatoid Arthritis; Osteomyelitis Neurologic Medical History: Positive for: Neuropathy Negative for: Dementia; Quadriplegia; Paraplegia; Seizure Disorder Past Medical History Notes: CVA in [place] History: Positive for: Received Radiation - 5 treatments in chest Negative for: Received Chemotherapy Psychiatric Complaints and Symptoms: No Complaints or Symptoms Medical History: LENNEX, PIETILA ([id number]) Negative for: Anorexia/bulimia; Confinement Anxiety Immunizations Pneumococcal Vaccine: Received Pneumococcal Vaccination: Yes Implantable Devices Family and Social History Cancer: No; Diabetes: Yes - Maternal Grandparents; Heart Disease: Yes - Paternal  Grandparents; Hereditary Spherocytosis: No; Hypertension: Yes - Paternal Grandparents; Kidney Disease: No; Lung Disease: Yes - Mother; Seizures: No; Stroke: No; Thyroid Problems: No; Tuberculosis: No; Former smoker - quit in 1993; Marital Status - Married; Alcohol Use: Never; Drug Use: No History; Caffeine Use: Daily; Financial Concerns: No; Food, Clothing or Shelter Needs: No; Support System Lacking: No; Transportation Concerns: No; Advanced Directives: No; Patient does not want information on Advanced Directives Physician Affirmation I have reviewed and agree with the above information. Electronic Signature(s) Signed: 02/10/2018 5:38:25 PM By: Montey Hora Signed: 02/13/2018 9:27:58 AM By: Worthy Keeler PA-C Entered By: Worthy Keeler on 02/10/2018 13:14:31 Froberg, Gavin Pound ([id number]) -------------------------------------------------------------------------------- SuperBill Details Patient Name: [name] T. Date of Service: 02/10/2018 Medical Record Number: [id number] Patient Account Number: [id number] Date of Birth/Sex: 04/26/1931 (84 y.o. M) Treating RN: Montey Hora Primary Care Provider: Garnette Gunner,  REGINA Other Clinician: Referring Provider: Webb Silversmith Treating Provider/Extender: STONE III, HOYT Weeks in Treatment: 9 Diagnosis Coding ICD-10 Codes Code Description I89.0 Lymphedema, not elsewhere classified L97.822 Non-pressure chronic ulcer of other part of left lower leg with fat layer exposed E11.622 Type 2 diabetes mellitus with other skin ulcer Z79.01 Long term (current) use of anticoagulants I50.42 Chronic combined systolic (congestive) and diastolic (congestive) heart failure J44.9 Chronic obstructive pulmonary disease, unspecified Facility Procedures CPT4 Code: 05397673 Description: 99213 - WOUND CARE VISIT-LEV 3 EST PT Modifier: Quantity: 1 Physician Procedures CPT4 Code Description: 4193790 24097 - WC PHYS LEVEL 4 - EST PT ICD-10 Diagnosis Description  I89.0 Lymphedema, not elsewhere classified L97.822 Non-pressure chronic ulcer of other part of left lower leg wit E11.622 Type 2 diabetes mellitus with other  skin ulcer Z79.01 Long term (current) use of anticoagulants Modifier: h fat layer expos Quantity: 1 ed Electronic Signature(s) Signed: 02/13/2018 9:27:58 AM By: Worthy Keeler PA-C Entered By: Worthy Keeler on 02/10/2018 13:20:21

## 2018-02-15 ENCOUNTER — Other Ambulatory Visit: Payer: Self-pay | Admitting: Internal Medicine

## 2018-02-16 ENCOUNTER — Ambulatory Visit (INDEPENDENT_AMBULATORY_CARE_PROVIDER_SITE_OTHER): Payer: Medicare Other | Admitting: Internal Medicine

## 2018-02-16 ENCOUNTER — Encounter: Payer: Self-pay | Admitting: Internal Medicine

## 2018-02-16 VITALS — BP 108/70 | HR 76 | Temp 97.6°F | Ht 66.0 in | Wt 186.0 lb

## 2018-02-16 DIAGNOSIS — E7800 Pure hypercholesterolemia, unspecified: Secondary | ICD-10-CM | POA: Diagnosis not present

## 2018-02-16 DIAGNOSIS — Z0000 Encounter for general adult medical examination without abnormal findings: Secondary | ICD-10-CM

## 2018-02-16 DIAGNOSIS — E039 Hypothyroidism, unspecified: Secondary | ICD-10-CM | POA: Diagnosis not present

## 2018-02-16 DIAGNOSIS — E1141 Type 2 diabetes mellitus with diabetic mononeuropathy: Secondary | ICD-10-CM

## 2018-02-16 LAB — LIPID PANEL
CHOL/HDL RATIO: 3
Cholesterol: 122 mg/dL (ref 0–200)
HDL: 47.5 mg/dL (ref >39.00)
LDL CALC: 37 mg/dL (ref 0–99)
NONHDL: 74.35
TRIGLYCERIDES: 189 mg/dL — AB (ref 0.0–149.0)
VLDL: 37.8 mg/dL (ref 0.0–40.0)

## 2018-02-16 LAB — MICROALBUMIN / CREATININE URINE RATIO
Creatinine,U: 18.7 mg/dL
MICROALB/CREAT RATIO: 3.7 mg/g (ref 0.0–30.0)
Microalb, Ur: <0.7 mg/dL (ref 0.0–1.9)

## 2018-02-16 LAB — CBC
HCT: 46.6 % (ref 39.0–52.0)
Hemoglobin: 15.5 g/dL (ref 13.0–17.0)
MCHC: 33.2 g/dL (ref 30.0–36.0)
MCV: 88.1 fl (ref 78.0–100.0)
PLATELETS: 213 10*3/uL (ref 150.0–400.0)
RBC: 5.29 Mil/uL (ref 4.22–5.81)
RDW: 17.6 % — AB (ref 11.5–15.5)
WBC: 17.4 10*3/uL — ABNORMAL HIGH (ref 4.0–10.5)

## 2018-02-16 LAB — COMPREHENSIVE METABOLIC PANEL
ALT: 12 U/L (ref 0–53)
AST: 11 U/L (ref 0–37)
Albumin: 3.5 g/dL (ref 3.5–5.2)
Alkaline Phosphatase: 73 U/L (ref 39–117)
BILIRUBIN TOTAL: 0.8 mg/dL (ref 0.2–1.2)
BUN: 46 mg/dL — AB (ref 6–23)
CHLORIDE: 97 meq/L (ref 96–112)
CO2: 35 meq/L — AB (ref 19–32)
CREATININE: 1.78 mg/dL — AB (ref 0.40–1.50)
Calcium: 9.9 mg/dL (ref 8.4–10.5)
GFR: 38.88 mL/min — ABNORMAL LOW (ref >60.00)
GLUCOSE: 116 mg/dL — AB (ref 70–99)
Potassium: 3.6 mEq/L (ref 3.5–5.1)
SODIUM: 139 meq/L (ref 135–145)
Total Protein: 6.7 g/dL (ref 6.0–8.3)

## 2018-02-16 LAB — HEMOGLOBIN A1C: HEMOGLOBIN A1C: 6.6 % — AB (ref 4.6–6.5)

## 2018-02-16 LAB — T4, FREE: FREE T4: 0.94 ng/dL (ref 0.60–1.60)

## 2018-02-16 LAB — TSH: TSH: 5.31 u[IU]/mL — ABNORMAL HIGH (ref 0.35–4.50)

## 2018-02-16 MED ORDER — TRAZODONE HCL 50 MG PO TABS: 100.0000 mg | ORAL_TABLET | Freq: Every evening | ORAL | 0 refills | Status: DC | PRN

## 2018-02-16 NOTE — Progress Notes (Signed)
HPI:  Past Medical History:  Diagnosis Date  . Arthritis   . CAD    a. MI 01/29/1996 tx'd w/ TPA @ [facility]; b. Myoview 06/2005: EF 50%, scar @ apex, mild peri-infarct ischemia  . Cancer ([place])    skin  . Chronic atrial fibrillation    a. since 2006; b. on warfarin  . Chronic diastolic CHF (congestive heart failure) ([place])    a. echo 04/2006: EF lower limits of nl, mod LVH, mild aortic root dilatation, & mild MR, biatrial enlargement; b. echo 04/2013: EF 60%, mod dilated LA, mild MR & TR, mod pulm HTN w/ RV systolic pressure 53, c. echo 04/21/14: EF 55-60%, unable to exclude WMA, severely dilated LA 6.6 cm, nl RVSP, mildly dilated aortic root  . COPD (chronic obstructive pulmonary disease) ([facility])    oxygen prn at home  . CVA 3532,9924   x2  . DM   . Falls   . GERD (gastroesophageal reflux disease)   . History of kidney stones   . HYPERLIPIDEMIA   . HYPERTENSION   . Kidney stone    a. s/p left ureteral stenting 04/24/14  . Left arm weakness    limited movement. S/P fall injury  . Neuropathy of both feet   . Poor balance   . Wears dentures    full upper and lower    Current Outpatient Medications  Medication Sig Dispense Refill  . acetaminophen (TYLENOL) 650 MG CR tablet Take 650 mg by mouth every 8 (eight) hours as needed for pain.     Marland Kitchen albuterol (PROAIR HFA) 108 (90 Base) MCG/ACT inhaler USE 1 TO 2 INHALATIONS EVERY 4 HOURS AS NEEDED 25.5 g 3  . ALPRAZolam (XANAX) 0.25 MG tablet Take 1 tablet (0.25 mg total) by mouth at bedtime as needed for anxiety. 90 tablet 0  . apixaban (ELIQUIS) 2.5 MG TABS tablet Take 1 tablet (2.5 mg total) by mouth 2 (two) times daily. 60 tablet 0  . atorvastatin (LIPITOR) 40 MG tablet TAKE 1 TABLET DAILY 90 tablet 0  . citalopram (CELEXA) 10 MG tablet TAKE 1 TABLET DAILY 90 tablet 0  . clotrimazole (LOTRIMIN) 1 % cream Apply 1 application topically 2 (two) times daily. 30 g 0  . COLCRYS 0.6 MG tablet TAKE 1 TABLET(0.6 MG) BY MOUTH TWICE DAILY AS NEEDED 20  tablet 0  . diclofenac sodium (VOLTAREN) 1 % GEL Apply 4 g topically 4 (four) times daily. 5 Tube 5  . dorzolamide (TRUSOPT) 2 % ophthalmic solution Place 1 drop into both eyes 3 (three) times daily.     Marland Kitchen ezetimibe (ZETIA) 10 MG tablet TAKE 1 TABLET DAILY 90 tablet 0  . finasteride (PROSCAR) 5 MG tablet TAKE 1 TABLET DAILY 90 tablet 4  . gabapentin (NEURONTIN) 100 MG capsule TAKE 1 CAPSULE THREE TIMES A DAY 270 capsule 1  . glipiZIDE (GLUCOTROL) 5 MG tablet TAKE 1 TABLET TWICE A DAY BEFORE MEALS 180 tablet 0  . HUMALOG KWIKPEN 100 UNIT/ML KiwkPen INJECT 18 UNITS WITH BREAKFAST, 20 UNITS WITH LUNCH, AND 16 UNITS WITH DINNER 60 mL 0  . Insulin Pen Needle (BD PEN NEEDLE NANO U/F) 32G X 4 MM MISC USE THREE TIMES A DAY FOR INSULIN ADMINISTRATION 270 each 4  . Insulin Syringe-Needle U-100 30G X 1/2" 1 ML MISC 1 each by Does not apply route 3 (three) times daily. 200 each 5  . JANUVIA 100 MG tablet TAKE 1 TABLET DAILY 90 tablet 0  . latanoprost (XALATAN) 0.005 % ophthalmic  solution Place 1 drop into both eyes at bedtime.     Marland Kitchen LEVEMIR FLEXTOUCH 100 UNIT/ML Pen INJECT 42 UNITS UNDER THE SKIN DAILY AT 10 P.M. 45 mL 0  . levothyroxine (SYNTHROID, LEVOTHROID) 50 MCG tablet TAKE 1 TABLET DAILY BEFORE BREAKFAST 90 tablet 0  . metolazone (ZAROXOLYN) 5 MG tablet TAKE 1 TABLET DAILY AS NEEDED (Patient taking differently: Take 5 mg by mouth daily as needed (swelling). ) 90 tablet 2  . metoprolol succinate (TOPROL-XL) 50 MG 24 hr tablet TAKE 1 TABLET TWICE A DAY 180 tablet 0  . nitroGLYCERIN (NITROSTAT) 0.4 MG SL tablet Place 0.4 mg under the tongue every 5 (five) minutes as needed.      . nystatin-triamcinolone (MYCOLOG II) cream Apply 1 application topically 2 (two) times daily.     . potassium chloride SA (K-DUR,KLOR-CON) 20 MEQ tablet Take 4 tablets (80 meq) by mouth twice daily, take an extra 1 tablet (20 meq) on the days you take metolazone 732 tablet 3  . predniSONE (DELTASONE) 10 MG tablet TAKE 1 TABLET  DAILY WITH BREAKFAST 90 tablet 1  . SYMBICORT 160-4.5 MCG/ACT inhaler USE 2 INHALATIONS TWICE A DAY (Patient taking differently: Inhale 2 puffs into the lungs 2 (two) times daily. ) 30.6 g 3  . tamsulosin (FLOMAX) 0.4 MG CAPS capsule TAKE 1 CAPSULE DAILY 90 capsule 1  . tiotropium (SPIRIVA HANDIHALER) 18 MCG inhalation capsule Place 1 capsule (18 mcg total) daily into inhaler and inhale. 90 capsule 3  . torsemide (DEMADEX) 20 MG tablet Take 40 mg by mouth 2 (two) times daily.    . traMADol (ULTRAM) 50 MG tablet Take 1 tablet (50 mg total) by mouth 2 (two) times daily. 60 tablet 0  . traZODone (DESYREL) 50 MG tablet Take 1 tablet (50 mg total) by mouth at bedtime as needed for sleep. 90 tablet 1   No current facility-administered medications for this visit.     Allergies  Allergen Reactions  . Contrast Media [Iodinated Diagnostic Agents] Shortness Of Breath  . Morphine And Related Other (See Comments)    Hallucinations   . Niacin And Related Dermatitis  . Other     Other reaction(s): SHORTNESS OF BREATH  . Amlodipine Rash    Family History  Problem Relation Age of Onset  . Heart disease Mother   . Heart disease Maternal Grandmother   . Diabetes Maternal Grandmother   . Cancer Neg Hx   . Stroke Neg Hx     Social History   Socioeconomic History  . Marital status: Married    Spouse name: Not on file  . Number of children: Not on file  . Years of education: Not on file  . Highest education level: Not on file  Occupational History  . Not on file  Social Needs  . Financial resource strain: Not on file  . Food insecurity:    Worry: Not on file    Inability: Not on file  . Transportation needs:    Medical: Not on file    Non-medical: Not on file  Tobacco Use  . Smoking status: Former Smoker    Packs/day: 2.00    Years: 40.00    Pack years: 80.00    Types: Cigarettes    Last attempt to quit: 05/24/1990    Years since quitting: 27.7  . Smokeless tobacco: Former Systems developer     Quit date: 05/24/1990  Substance and Sexual Activity  . Alcohol use: No  . Drug use: No  .  Sexual activity: Not Currently  Lifestyle  . Physical activity:    Days per week: Not on file    Minutes per session: Not on file  . Stress: Not on file  Relationships  . Social connections:    Talks on phone: Not on file    Gets together: Not on file    Attends religious service: Not on file    Active member of club or organization: Not on file    Attends meetings of clubs or organizations: Not on file    Relationship status: Not on file  . Intimate partner violence:    Fear of current or ex partner: Not on file    Emotionally abused: Not on file    Physically abused: Not on file    Forced sexual activity: Not on file  Other Topics Concern  . Not on file  Social History Narrative  . Not on file    Hospitiliaztions:  Health Maintenance:    Flu:  Tetanus:  Pneumovax:  Prevnar:  Zostavax:  Mammogram:  Pap Smear:  PSA:  Bone Density:  Colon Screening:  Eye Doctor:  Dental Exam:   Providers:   PCP:  Dermatologist:  Cardiologist:  ENT:  Gastroenterologist:  Pulmonologist:   I have personally reviewed and have noted:  1. The patient's medical and social history 2. Their use of alcohol, tobacco or illicit drugs 3. Their current medications and supplements 4. The patient's functional ability including ADL's, fall risks, home safety risks and  hearing or visual impairment. 5. Diet and physical activities 6. Evidence for depression or mood disorder  Subjective:   Review of Systems:   Constitutional: Denies fever, malaise, fatigue, headache or abrupt weight changes.  HEENT: Denies eye pain, eye redness, ear pain, ringing in the ears, wax buildup, runny nose, nasal congestion, bloody nose, or sore throat. Respiratory: Denies difficulty breathing, shortness of breath, cough or sputum production.   Cardiovascular: Denies chest pain, chest tightness, palpitations or  swelling in the hands or feet.  Gastrointestinal: Denies abdominal pain, bloating, constipation, diarrhea or blood in the stool.  GU: Denies urgency, frequency, pain with urination, burning sensation, blood in urine, odor or discharge. Musculoskeletal: Denies decrease in range of motion, difficulty with gait, muscle pain or joint pain and swelling.  Skin: Denies redness, rashes, lesions or ulcercations.  Neurological: Denies dizziness, difficulty with memory, difficulty with speech or problems with balance and coordination.  Psych: Denies anxiety, depression, SI/HI.  No other specific complaints in a complete review of systems (except as listed in HPI above).  Objective:  PE:   Pulse 76   Temp 97.6 F (36.4 C) (Oral)   Ht 5\' 6"  (1.676 m)   Wt 186 lb (84.4 kg)   SpO2 97%   BMI 30.02 kg/m  Wt Readings from Last 3 Encounters:  02/16/18 186 lb (84.4 kg)  01/19/18 179 lb (81.2 kg)  01/12/18 188 lb (85.3 kg)    General: Appears their stated age, well developed, well nourished in NAD. Skin: Warm, dry and intact. No rashes, lesions or ulcerations noted. HEENT: Head: normal shape and size; Eyes: sclera white, no icterus, conjunctiva pink, PERRLA and EOMs intact; Ears: Tm's gray and intact, normal light reflex; Throat/Mouth: Teeth present, mucosa pink and moist, no exudate, lesions or ulcerations noted.  Neck: Neck supple, trachea midline. No masses, lumps or thyromegaly present.  Cardiovascular: Normal rate and rhythm. S1,S2 noted.  No murmur, rubs or gallops noted. No JVD or BLE edema. No carotid bruits  noted. Pulmonary/Chest: Normal effort and positive vesicular breath sounds. No respiratory distress. No wheezes, rales or ronchi noted.  Abdomen: Soft and nontender. Normal bowel sounds. No distention or masses noted. Liver, spleen and kidneys non palpable. Musculoskeletal: Normal range of motion. Strength 5/5 BUE/BLE. No signs of joint swelling.  Neurological: Alert and oriented. Cranial  nerves II-XII grossly intact. Coordination normal.  Psychiatric: Mood and affect normal. Behavior is normal. Judgment and thought content normal.   EKG:  BMET    Component Value Date/Time   NA 141 02/09/2018 1040   NA 143 02/21/2015 1108   NA 137 08/05/2014 1839   K 3.3 (L) 02/09/2018 1040   K 3.6 08/05/2014 1839   CL 96 (L) 02/09/2018 1040   CL 106 08/05/2014 1839   CO2 34 (H) 02/09/2018 1040   CO2 23 08/05/2014 1839   GLUCOSE 135 (H) 02/09/2018 1040   GLUCOSE 235 (H) 08/05/2014 1839   BUN 48 (H) 02/09/2018 1040   BUN 26 02/21/2015 1108   BUN 19 08/05/2014 1839   CREATININE 1.82 (H) 02/09/2018 1040   CREATININE 1.20 08/05/2014 1839   CALCIUM 9.1 02/09/2018 1040   CALCIUM 8.6 (L) 08/05/2014 1839   GFRNONAA 32 (L) 02/09/2018 1040   GFRNONAA 57 (L) 08/05/2014 1839   GFRAA 38 (L) 02/09/2018 1040   GFRAA >60 08/05/2014 1839    Lipid Panel     Component Value Date/Time   CHOL 104 11/22/2016 1019   TRIG 154.0 (H) 11/22/2016 1019   HDL 43.80 11/22/2016 1019   CHOLHDL 2 11/22/2016 1019   VLDL 30.8 11/22/2016 1019   LDLCALC 29 11/22/2016 1019    CBC    Component Value Date/Time   WBC 14.7 (H) 12/25/2017 0418   RBC 4.57 12/25/2017 0418   HGB 13.6 12/25/2017 0418   HGB 13.3 08/05/2014 1839   HCT 39.3 (L) 12/25/2017 0418   HCT 40.1 08/05/2014 1839   PLT 217 12/25/2017 0418   PLT 246 08/05/2014 1839   MCV 86.1 12/25/2017 0418   MCV 83 08/05/2014 1839   MCH 29.7 12/25/2017 0418   MCHC 34.6 12/25/2017 0418   RDW 17.4 (H) 12/25/2017 0418   RDW 16.3 (H) 08/05/2014 1839   LYMPHSABS 1.6 12/21/2017 1717   LYMPHSABS 2.3 08/05/2014 1839   MONOABS 1.6 (H) 12/21/2017 1717   MONOABS 2.0 (H) 08/05/2014 1839   EOSABS 0.0 12/21/2017 1717   EOSABS 0.1 08/05/2014 1839   BASOSABS 0.1 12/21/2017 1717   BASOSABS 0.1 08/05/2014 1839    Hgb A1C Lab Results  Component Value Date   HGBA1C 6.7 (H) 02/15/2017      Assessment and Plan:   Medicare Annual Wellness  Visit:  Diet: Heart healthy or DM if diabetic Physical activity: Sedentary Depression/mood screen: Negative Hearing: Intact to whispered voice Visual acuity: Grossly normal, performs annual eye exam  ADLs: Capable Fall risk: None Home safety: Good Cognitive evaluation: Intact to orientation, naming, recall and repetition EOL planning: Adv directives, full code/ I agree  Preventative Medicine:   Next appointment:   Webb Silversmith, NP

## 2018-02-16 NOTE — Patient Instructions (Signed)

## 2018-02-16 NOTE — Addendum Note (Signed)
Addended by: Ellamae Sia on: 02/16/2018 03:47 PM   Modules accepted: Orders

## 2018-02-17 ENCOUNTER — Ambulatory Visit: Payer: Medicare Other

## 2018-02-17 ENCOUNTER — Encounter: Payer: Self-pay | Admitting: Internal Medicine

## 2018-02-19 NOTE — Progress Notes (Signed)
Cardiology Office Note  Date:  02/21/2018   ID:  [name]., DOB 03-25-1932, MRN [id number]  PCP:  Jearld Fenton, NP   Chief Complaint  Patient presents with  . OTHER    12 month f/u no complaints today. Meds reviewed verbally with pt.    HPI:  Mr. Sartwell is a 84 year old gentleman with history of  diabetes type 2,poorly controlled coronary artery disease, status post myocardial infarction treated with TPA at [facility] in January 29, 1996.  chronic atrial fibrillation since 2006, on eliquis 2595 had a cva, uncertain if he was on anticoagulation at the time History off falls.  Seen by Dr. Raul Del, pulmonary.  seen by CHF clinic. Several admissions to the hospital dating back to December 2014 with urine output function, urinary tract infections, status post stent placement by Dr. Elenor Quinones. He presents today for evaluation of his acute on chronic diastolic CHF  Drinking a lot of fluids Torsemide 40 BID Weight at home: 186.6  takes metolazone half pill  for weight 188 pounds Takes potassium 4 of the 20 meq pills  Does not take extra for metolazone  Chronic lower extremity edema Left leg weeping, wrap in place One spot healing, Wound nurse does his bandages  Denies significant shortness of breath Continues to have periodic falls  Had a major fall September 2019 In the hospital; 12/2017 Cellulitis of arm, left Hospital records reviewed with the patient in detail " Superglue my arm back together"  Stress at home, wife with severe Parkinson's  Previous fall last year 2018  "cracked sternum" Chest x-rays performed in the emergency room showing Nondisplaced fx of his sternum  Lab work reviewed Creatinine 1.7 Hemoglobin A1c fluctuating 6-9  EKG personally reviewed by myself on todays visit Shows atrial fibrillation ventricular rate 78 bpm old anteroseptal MI, unable to exclude old inferior MI  Other past medical history history of strokes documented on  MRI. History of fall, fell backwards when walking with his walker hospital 06/03/2014 with discharge on the 26th with urinary tract infection requiring antibiotics.  hospital admission 05/02/2014 with acute stroke. He was off warfarin for circumcision in early January 2016. He did have Lovenox bridge MRI showed multiple embolic strokes, left parietal, occipital areas. He was seen by neurology. He reports having continued vision problems. INR was 2 and he was continued on his warfarin. He is doing home physical therapy. At the time of discharge he was started on diltiazem. He does have chronic lower extremity edema. He has not received the diltiazem yet and is waiting for mail-order.  carotid ultrasound showing bilateral mild plaque dated 05/03/2014 Echocardiogram 05/03/2014 showing normal LV systolic function, normal right ventricular systolic pressure MRI brain showing multiple punctate foci acute infarction left parietal, left occipital, left frontal lobes likely embolic, chronic right occipital infarct  hospital admission January 26 2014 with discharge October 20 with urosepsis, bladder outflow obstruction.cultures grew [facility] was called to place a Foley catheter. Initial creatinine 1.56, BUN 25.  Echocardiogram 01/25/2014 showing moderate pulmonary hypertension, normal ejection fraction Notes indicate he had antibiotics, diuresis through his hospital course with improvement of his renal function down to creatinine 1.26. Creatinine was checked 2 days ago and was up to 1.4 with BUN 29 He had a Foley catheter for 10 days which was removed  He fell in January 2015 after taking a nap, he fell into his dresser drawer. Significant trauma to his left arm He fell possibly in April 2015the details are unavailable.  Did not hurt himself [place] again recently while in the bathroom. Lost his footing, fell backwards, broke the toilet lid, large left flank hematoma  Myoview in March 2007  showed an ejection fraction of 50% with an old scar at the apex and mild peri-infarct ischemia.  echocardiogram in January 2008 showed an ejection fraction at the lower limits of normal with moderate LVH, mild aortic root dilatation, and mild mitral regurgitation. There was biatrial enlargement.  echocardiogram January 2015 showing ejection fraction 60%, moderately dilated left atrium, mild MR and TR, moderate pulmonary hypertension with right ventricular systolic pressure 53  Previous carotid Dopplers in January of 2008 showed 0-39% stenosis bilaterally. Carotid ultrasound may 2009 showing mild bilateral plaque, no significant stenoses  PMH:   has a past medical history of Arthritis, CAD, Cancer ([place]), Chronic atrial fibrillation, Chronic diastolic CHF (congestive heart failure) ([place]), COPD (chronic obstructive pulmonary disease) ([place]), CVA ([id number],[id number]), DM, Falls, GERD (gastroesophageal reflux disease), History of kidney stones, HYPERLIPIDEMIA, HYPERTENSION, Kidney stone, Left arm weakness, Neuropathy of both feet, Poor balance, and Wears dentures.  PSH:    Past Surgical History:  Procedure Laterality Date  . BLADDER SURGERY     stent placement   . CARDIAC CATHETERIZATION  1997   [facility]  . CAROTID STENT INSERTION  1997  . CATARACT EXTRACTION W/PHACO Left 10/11/2017   Procedure: CATARACT EXTRACTION PHACO AND INTRAOCULAR LENS PLACEMENT ([place]) COMPLICATED LEFT DIABETIC;  Surgeon: Leandrew Koyanagi, MD;  Location: [place];  Service: Ophthalmology;  Laterality: Left;  MALYUGIN Diabetic - insulin  . CATARACT EXTRACTION W/PHACO Right 11/30/2017   Procedure: CATARACT EXTRACTION PHACO AND INTRAOCULAR LENS PLACEMENT ([place]) COMPLICATED  RIGHT DIABETIC;  Surgeon: Leandrew Koyanagi, MD;  Location: [place];  Service: Ophthalmology;  Laterality: Right;  Diabetic - insulin  . CIRCUMCISION  2016  . CORONARY ANGIOPLASTY  1997   s/p stent placement x 2   . CYSTOSCOPY W/ URETERAL  STENT REMOVAL Left 10/09/2014   Procedure: CYSTOSCOPY WITH STENT REMOVAL;  Surgeon: Hollice Espy, MD;  Location: [facility];  Service: Urology;  Laterality: Left;  . CYSTOSCOPY WITH STENT PLACEMENT Left 10/09/2014   Procedure: CYSTOSCOPY WITH STENT PLACEMENT;  Surgeon: Hollice Espy, MD;  Location: [facility];  Service: Urology;  Laterality: Left;  . KIDNEY SURGERY  05/2013   s/p stent placement   . stents ureters Bilateral   . TONSILLECTOMY AND ADENOIDECTOMY  1959  . URETEROSCOPY WITH HOLMIUM LASER LITHOTRIPSY Left 10/09/2014   Procedure: URETEROSCOPY WITH HOLMIUM LASER LITHOTRIPSY;  Surgeon: Hollice Espy, MD;  Location: [facility];  Service: Urology;  Laterality: Left;    Current Outpatient Medications  Medication Sig Dispense Refill  . acetaminophen (TYLENOL) 650 MG CR tablet Take 650 mg by mouth every 8 (eight) hours as needed for pain.     Marland Kitchen albuterol (PROAIR HFA) 108 (90 Base) MCG/ACT inhaler USE 1 TO 2 INHALATIONS EVERY 4 HOURS AS NEEDED 25.5 g 3  . ALPRAZolam (XANAX) 0.25 MG tablet Take 1 tablet (0.25 mg total) by mouth at bedtime as needed for anxiety. 90 tablet 0  . apixaban (ELIQUIS) 2.5 MG TABS tablet Take 1 tablet (2.5 mg total) by mouth 2 (two) times daily. 60 tablet 0  . atorvastatin (LIPITOR) 40 MG tablet TAKE 1 TABLET DAILY 90 tablet 0  . citalopram (CELEXA) 10 MG tablet TAKE 1 TABLET DAILY 90 tablet 0  . clotrimazole (LOTRIMIN) 1 % cream Apply 1 application topically 2 (two) times daily. 30 g 0  . COLCRYS 0.6 MG  tablet TAKE 1 TABLET(0.6 MG) BY MOUTH TWICE DAILY AS NEEDED 20 tablet 0  . diclofenac sodium (VOLTAREN) 1 % GEL Apply 4 g topically 4 (four) times daily. 5 Tube 5  . dorzolamide (TRUSOPT) 2 % ophthalmic solution Place 1 drop into both eyes 3 (three) times daily.     Marland Kitchen ezetimibe (ZETIA) 10 MG tablet TAKE 1 TABLET DAILY 90 tablet 0  . finasteride (PROSCAR) 5 MG tablet TAKE 1 TABLET DAILY 90 tablet 4  . gabapentin (NEURONTIN) 100 MG capsule TAKE 1 CAPSULE THREE TIMES A DAY  270 capsule 1  . glipiZIDE (GLUCOTROL) 5 MG tablet TAKE 1 TABLET TWICE A DAY BEFORE MEALS 180 tablet 0  . HUMALOG KWIKPEN 100 UNIT/ML KiwkPen INJECT 18 UNITS WITH BREAKFAST, 20 UNITS WITH LUNCH, AND 16 UNITS WITH DINNER 60 mL 0  . Insulin Pen Needle (BD PEN NEEDLE NANO U/F) 32G X 4 MM MISC USE THREE TIMES A DAY FOR INSULIN ADMINISTRATION 270 each 4  . Insulin Syringe-Needle U-100 30G X 1/2" 1 ML MISC 1 each by Does not apply route 3 (three) times daily. 200 each 5  . JANUVIA 100 MG tablet TAKE 1 TABLET DAILY 90 tablet 0  . latanoprost (XALATAN) 0.005 % ophthalmic solution Place 1 drop into both eyes at bedtime.     Marland Kitchen LEVEMIR FLEXTOUCH 100 UNIT/ML Pen INJECT 42 UNITS UNDER THE SKIN DAILY AT 10 P.M. 45 mL 0  . levothyroxine (SYNTHROID, LEVOTHROID) 50 MCG tablet TAKE 1 TABLET DAILY BEFORE BREAKFAST 90 tablet 0  . metolazone (ZAROXOLYN) 5 MG tablet TAKE 1 TABLET DAILY AS NEEDED (Patient taking differently: Take 5 mg by mouth daily as needed (swelling). ) 90 tablet 2  . metoprolol succinate (TOPROL-XL) 50 MG 24 hr tablet TAKE 1 TABLET TWICE A DAY 180 tablet 0  . nitroGLYCERIN (NITROSTAT) 0.4 MG SL tablet Place 0.4 mg under the tongue every 5 (five) minutes as needed.      . nystatin-triamcinolone (MYCOLOG II) cream Apply 1 application topically 2 (two) times daily.     . potassium chloride SA (K-DUR,KLOR-CON) 20 MEQ tablet Take 4 tablets (80 meq) by mouth twice daily, take an extra 1 tablet (20 meq) on the days you take metolazone 732 tablet 3  . predniSONE (DELTASONE) 10 MG tablet TAKE 1 TABLET DAILY WITH BREAKFAST 90 tablet 1  . SYMBICORT 160-4.5 MCG/ACT inhaler USE 2 INHALATIONS TWICE A DAY (Patient taking differently: Inhale 2 puffs into the lungs 2 (two) times daily. ) 30.6 g 3  . tamsulosin (FLOMAX) 0.4 MG CAPS capsule TAKE 1 CAPSULE DAILY 90 capsule 1  . tiotropium (SPIRIVA HANDIHALER) 18 MCG inhalation capsule Place 1 capsule (18 mcg total) daily into inhaler and inhale. 90 capsule 3  .  torsemide (DEMADEX) 20 MG tablet Take 40 mg by mouth 2 (two) times daily.    . traMADol (ULTRAM) 50 MG tablet Take 1 tablet (50 mg total) by mouth 2 (two) times daily. 60 tablet 0  . traZODone (DESYREL) 50 MG tablet Take 2 tablets (100 mg total) by mouth at bedtime as needed for sleep. 180 tablet 0   No current facility-administered medications for this visit.      Allergies:   Contrast media [iodinated diagnostic agents]; Morphine and related; Niacin and related; Other; and Amlodipine   Social History:  The patient  reports that he quit smoking about 27 years ago. His smoking use included cigarettes. He has a 80.00 pack-year smoking history. He quit smokeless tobacco use about 27  years ago. He reports that he does not drink alcohol or use drugs.   Family History:   family history includes Diabetes in his maternal grandmother; Heart disease in his maternal grandmother and mother.    Review of Systems: Review of Systems  Constitutional: Negative.   Respiratory: Positive for shortness of breath.   Cardiovascular: Positive for leg swelling.  Gastrointestinal: Negative.   Musculoskeletal: Negative.        Unsteady gait  Neurological: Negative.   Psychiatric/Behavioral: Negative.   All other systems reviewed and are negative.   PHYSICAL EXAM: VS:  BP 118/64 (BP Location: Left Arm, Patient Position: Sitting, Cuff Size: Normal)   Pulse 78   Ht 5\' 6"  (1.676 m)   Wt 191 lb 8 oz (86.9 kg)   BMI 30.91 kg/m  , BMI Body mass index is 30.91 kg/m. Constitutional:  oriented to person, place, and time. No distress.  HENT:  Head: Grossly normal Eyes:  no discharge. No scleral icterus.  Neck: No JVD, no carotid bruits  Cardiovascular: Regular rate and rhythm, no murmurs appreciated 1-2+ pitting lower extremity edema bilaterally to below the knees, Ace wrap on the left lower extremity Pulmonary/Chest: Clear to auscultation bilaterally, no wheezes or rails Abdominal: Soft.  no distension.  no  tenderness.  Musculoskeletal: Normal range of motion Neurological:  normal muscle tone. Coordination normal. No atrophy Skin: Skin warm and dry Psychiatric: normal affect, pleasant  Recent Labs: 12/25/2017: Magnesium 2.2 02/16/2018: ALT 12; BUN 46; Creatinine, Ser 1.78; Hemoglobin 15.5; Platelets 213.0; Potassium 3.6; Sodium 139; TSH 5.31    Lipid Panel Lab Results  Component Value Date   CHOL 122 02/16/2018   HDL 47.50 02/16/2018   LDLCALC 37 02/16/2018   TRIG 189.0 (H) 02/16/2018      Wt Readings from Last 3 Encounters:  02/21/18 191 lb 8 oz (86.9 kg)  02/16/18 186 lb (84.4 kg)  01/19/18 179 lb (81.2 kg)       ASSESSMENT AND PLAN:  Mixed hyperlipidemia - Plan: EKG 12-Lead Cholesterol at goal, no medication changes made  Essential hypertension - Plan: EKG 12-Lead Blood pressure is well controlled on today's visit. No changes made to the medications. Stable  Coronary artery disease involving native coronary artery of native heart without angina pectoris - Plan: EKG 12-Lead Denies anginal symptoms, no further testing  Chronic atrial fibrillation ([place]) - Plan: EKG 12-Lead Tolerating atrial fibrillation, rate well controlled Likely contributing to fluid retention  Cerebrovascular accident (CVA) due to embolism of middle cerebral artery, unspecified blood vessel laterality ([place]) - Plan: EKG 12-Lead On anticoagulation, no recent events  Chronic diastolic CHF (congestive heart failure) ([place]) - Plan: EKG 12-Lead Long discussion concerning his CHF symptoms Significant lower extremity edema, likely multifactorial Recommend he stay on torsemide 40 twice daily potassium 4 of the 20 mg pills in the morning and 4 in the afternoon Recommend he take metolazone for weight greater than 186 pounds  Controlled type 2 diabetes mellitus with other circulatory complication, with long-term current use of insulin ([place]) - Plan: EKG 12-Lead Recommended low carbohydrate diet Unable to  perform exercise program for weight loss  Centrilobular emphysema ([place]) - Plan: EKG 12-Lead Has been stable, not on oxygen Recommended he continue aggressive diuresis weight goal 186 pounds or less   Total encounter time more than 25 minutes  Greater than 50% was spent in counseling and coordination of care with the patient   Disposition:   F/U  12 months   Orders Placed This Encounter  Procedures  . EKG 12-Lead     Signed, Esmond Plants, M.D., Ph.D. 02/21/2018  [place], [place]

## 2018-02-20 ENCOUNTER — Ambulatory Visit: Admission: RE | Admit: 2018-02-20 | Payer: Medicare Other | Source: Ambulatory Visit

## 2018-02-20 ENCOUNTER — Encounter: Payer: Self-pay | Admitting: Internal Medicine

## 2018-02-20 NOTE — Telephone Encounter (Signed)
Pt called in stating that he spoke with Rollene Fare about cancelling his MRI. He received a call this morning asking where he was for the MRI. Pt just wants to make sure he won't be charged. Please advise

## 2018-02-21 ENCOUNTER — Encounter: Payer: Self-pay | Admitting: Cardiovascular Disease

## 2018-02-21 ENCOUNTER — Ambulatory Visit (INDEPENDENT_AMBULATORY_CARE_PROVIDER_SITE_OTHER): Payer: Medicare Other | Admitting: Cardiovascular Disease

## 2018-02-21 VITALS — BP 118/64 | HR 78 | Ht 66.0 in | Wt 191.5 lb

## 2018-02-21 DIAGNOSIS — I482 Chronic atrial fibrillation, unspecified: Secondary | ICD-10-CM | POA: Diagnosis not present

## 2018-02-21 DIAGNOSIS — J432 Centrilobular emphysema: Secondary | ICD-10-CM

## 2018-02-21 DIAGNOSIS — I5032 Chronic diastolic (congestive) heart failure: Secondary | ICD-10-CM

## 2018-02-21 DIAGNOSIS — I63419 Cerebral infarction due to embolism of unspecified middle cerebral artery: Secondary | ICD-10-CM

## 2018-02-21 DIAGNOSIS — Z794 Long term (current) use of insulin: Secondary | ICD-10-CM

## 2018-02-21 DIAGNOSIS — I25118 Atherosclerotic heart disease of native coronary artery with other forms of angina pectoris: Secondary | ICD-10-CM

## 2018-02-21 DIAGNOSIS — E1159 Type 2 diabetes mellitus with other circulatory complications: Secondary | ICD-10-CM

## 2018-02-21 NOTE — Patient Instructions (Signed)

## 2018-02-24 ENCOUNTER — Encounter: Payer: Medicare Other | Admitting: Physician Assistant

## 2018-02-24 ENCOUNTER — Other Ambulatory Visit: Payer: Self-pay | Admitting: Internal Medicine

## 2018-02-24 DIAGNOSIS — E11622 Type 2 diabetes mellitus with other skin ulcer: Secondary | ICD-10-CM | POA: Diagnosis not present

## 2018-02-26 ENCOUNTER — Other Ambulatory Visit: Payer: Self-pay | Admitting: Internal Medicine

## 2018-02-27 NOTE — Progress Notes (Signed)
GIBRAN, VESELKA ([id number]) Visit Report for 02/24/2018 Arrival Information Details Patient Name: [name], [name]. Date of Service: 02/24/2018 9:30 AM Medical Record Number: [id number] Patient Account Number: [id number] Date of Birth/Sex: 1932-02-28 (84 y.o. M) Treating RN: Montey Hora Primary Care Hattie Pine: Webb Silversmith Other Clinician: Referring Jerl Munyan: Webb Silversmith Treating Wyllow Seigler/Extender: Melburn Hake, HOYT Weeks in Treatment: 11 Visit Information History Since Last Visit Added or deleted any medications: No Patient Arrived: Walker Any new allergies or adverse reactions: No Arrival Time: 09:24 Had a fall or experienced change in No Accompanied By: friend activities of daily living that may affect Transfer Assistance: None risk of falls: Patient Identification Verified: Yes Signs or symptoms of abuse/neglect since last visito No Secondary Verification Process Yes Hospitalized since last visit: No Completed: Implantable device outside of the clinic excluding No Patient Has Alerts: Yes cellular tissue based products placed in the center Patient Alerts: Patient on Blood since last visit: Thinner Has Dressing in Place as Prescribed: Yes Eliquis Pain Present Now: No DMII ABI [place] BILATERAL Electronic Signature(s) Signed: 02/24/2018 3:06:22 PM By: Lorine Bears RCP, RRT, CHT Entered By: Lorine Bears on 02/24/2018 09:24:49 [name] ([id number]) -------------------------------------------------------------------------------- Encounter Discharge Information Details Patient Name: Shawn Bark T. Date of Service: 02/24/2018 9:30 AM Medical Record Number: [id number] Patient Account Number: [id number] Date of Birth/Sex: 1931/10/24 (84 y.o. M) Treating RN: Montey Hora Primary Care Barby Colvard: Webb Silversmith Other Clinician: Referring Myrissa Chipley: Webb Silversmith Treating Jarelyn Bambach/Extender: Melburn Hake, HOYT Weeks in Treatment:  11 Encounter Discharge Information Items Post Procedure Vitals Discharge Condition: Stable Temperature (F): 97.5 Ambulatory Status: Walker Pulse (bpm): 70 Discharge Destination: Home Respiratory Rate (breaths/min): 18 Transportation: Private Auto Blood Pressure (mmHg): 108/63 Accompanied By: self Schedule Follow-up Appointment: Yes Clinical Summary of Care: Electronic Signature(s) Signed: 02/24/2018 10:27:32 AM By: Montey Hora Entered By: Montey Hora on 02/24/2018 10:27:32 [name] ([id number]) -------------------------------------------------------------------------------- Lower Extremity Assessment Details Patient Name: Shawn Bark T. Date of Service: 02/24/2018 9:30 AM Medical Record Number: [id number] Patient Account Number: [id number] Date of Birth/Sex: [birth year] (84 y.o. M) Treating RN: Secundino Ginger Primary Care Analiese Krupka: Webb Silversmith Other Clinician: Referring Yovanna Cogan: Webb Silversmith Treating Oniya Mandarino/Extender: Melburn Hake, HOYT Weeks in Treatment: 11 Edema Assessment Assessed: [Left: No] [Right: No] [Left: Edema] [Right: :] Calf Left: Right: Point of Measurement: 30 cm From Medial Instep 35 cm cm Ankle Left: Right: Point of Measurement: 10 cm From Medial Instep 23 cm cm Vascular Assessment Claudication: Claudication Assessment [Left:None] Pulses: Dorsalis Pedis Palpable: [Left:Yes] Posterior Tibial Extremity colors, hair growth, and conditions: Extremity Color: [Left:Red] Hair Growth on Extremity: [Left:No] Temperature of Extremity: [Left:Warm] Capillary Refill: [Left:< 3 seconds] Toe Nail Assessment Left: Right: Thick: Yes Discolored: Yes Deformed: Yes Improper Length and Hygiene: No Electronic Signature(s) Signed: 02/24/2018 4:46:57 PM By: Secundino Ginger Entered By: Secundino Ginger on 02/24/2018 09:41:07 [place], [name] ([id number]) -------------------------------------------------------------------------------- Multi Wound Chart  Details Patient Name: Shawn Bark T. Date of Service: 02/24/2018 9:30 AM Medical Record Number: [id number] Patient Account Number: [id number] Date of Birth/Sex: [birth year] (84 y.o. M) Treating RN: Montey Hora Primary Care Abijah Roussel: Webb Silversmith Other Clinician: Referring Tannen Vandezande: Webb Silversmith Treating Bernadine Melecio/Extender: STONE III, HOYT Weeks in Treatment: 11 Vital Signs Height(in): 66 Pulse(bpm): 70 Weight(lbs): 182 Blood Pressure(mmHg): 108/63 Body Mass Index(BMI): 29 Temperature(F): 97.5 Respiratory Rate 16 (breaths/min): Photos: [N/A:N/A] Wound Location: Left Lower Leg - Lateral N/A N/A Wounding Event: Not Known N/A N/A Primary Etiology: Venous Leg Ulcer N/A N/A Comorbid History: Chronic Obstructive N/A N/A Pulmonary Disease (COPD), Sleep  Apnea, Arrhythmia, Congestive Heart Failure, Hypertension, Myocardial Infarction, Type II Diabetes, Osteoarthritis, Neuropathy, Received Radiation Date Acquired: 01/16/2018 N/A N/A Weeks of Treatment: 3 N/A N/A Wound Status: Open N/A N/A Measurements L x W x D 1.3x0.8x0.1 N/A N/A (cm) Area (cm) : 0.817 N/A N/A Volume (cm) : 0.082 N/A N/A % Reduction in Area: 77.10% N/A N/A % Reduction in Volume: 77.00% N/A N/A Classification: Full Thickness Without N/A N/A Exposed Support Structures Exudate Amount: Small N/A N/A Exudate Type: Serous N/A N/A Exudate Color: amber N/A N/A Wound Margin: Flat and Intact N/A N/A Granulation Amount: None Present (0%) N/A N/A Necrotic Amount: Medium (34-66%) N/A N/A Exposed Structures: N/A N/A [name], Shawn T. ([id number]) Fascia: No Fat Layer (Subcutaneous Tissue) Exposed: No Tendon: No Muscle: No Joint: No Bone: No Periwound Skin Texture: Excoriation: No N/A N/A Induration: No Callus: No Crepitus: No Rash: No Scarring: No Periwound Skin Moisture: Maceration: No N/A N/A Dry/Scaly: No Periwound Skin Color: Atrophie Blanche: No N/A N/A Cyanosis: No Ecchymosis:  No Erythema: No Hemosiderin Staining: No Mottled: No Pallor: No Rubor: No Tenderness on Palpation: No N/A N/A Wound Preparation: Ulcer Cleansing: N/A N/A Rinsed/Irrigated with Saline Topical Anesthetic Applied: Other: lidocaine 4% Treatment Notes Electronic Signature(s) Signed: 02/24/2018 5:06:38 PM By: Montey Hora Entered By: Montey Hora on 02/24/2018 10:01:52 [name] ([id number]) -------------------------------------------------------------------------------- [place] Details Patient Name: Shawn Bark T. Date of Service: 02/24/2018 9:30 AM Medical Record Number: [id number] Patient Account Number: [id number] Date of Birth/Sex: 1931-08-23 (84 y.o. M) Treating RN: Montey Hora Primary Care Imanol Bihl: Webb Silversmith Other Clinician: Referring Tonye Tancredi: Webb Silversmith Treating Anely Spiewak/Extender: Melburn Hake, HOYT Weeks in Treatment: 11 Active Inactive ` Abuse / Safety / Falls / Self Care Management Nursing Diagnoses: Potential for falls Goals: Patient will not experience any injury related to falls Date Initiated: 12/06/2017 Target Resolution Date: 03/18/2018 Goal Status: Active Interventions: Assess Activities of Daily Living upon admission and as needed Assess fall risk on admission and as needed Assess: immobility, friction, shearing, incontinence upon admission and as needed Assess impairment of mobility on admission and as needed per policy Assess personal safety and home safety (as indicated) on admission and as needed Assess self care needs on admission and as needed Notes: ` Nutrition Nursing Diagnoses: Imbalanced nutrition Impaired glucose control: actual or potential Potential for alteratiion in Nutrition/Potential for imbalanced nutrition Goals: Patient/caregiver agrees to and verbalizes understanding of need to use nutritional supplements and/or vitamins as prescribed Date Initiated: 12/06/2017 Target Resolution Date:  03/18/2018 Goal Status: Active Patient/caregiver will maintain therapeutic glucose control Date Initiated: 12/06/2017 Target Resolution Date: 03/18/2018 Goal Status: Active Interventions: Assess patient nutrition upon admission and as needed per policy Provide education on elevated blood sugars and impact on wound healing Provide education on nutrition [name], [name] ([id number]) Notes: ` Orientation to the Wound Care Program Nursing Diagnoses: Knowledge deficit related to the wound healing center program Goals: Patient/caregiver will verbalize understanding of the [place] Date Initiated: 12/06/2017 Target Resolution Date: 12/17/2017 Goal Status: Active Interventions: Provide education on orientation to the wound center Notes: ` Wound/Skin Impairment Nursing Diagnoses: Impaired tissue integrity Knowledge deficit related to ulceration/compromised skin integrity Goals: Ulcer/skin breakdown will have a volume reduction of 80% by week 12 Date Initiated: 12/06/2017 Target Resolution Date: 03/11/2018 Goal Status: Active Interventions: Assess patient/caregiver ability to perform ulcer/skin care regimen upon admission and as needed Assess ulceration(s) every visit Notes: Electronic Signature(s) Signed: 02/24/2018 5:06:38 PM By: Montey Hora Entered By: Montey Hora on 02/24/2018 10:01:30  [name], [name] ([id number]) -------------------------------------------------------------------------------- Pain Assessment Details Patient Name: [name], STETZER. Date of Service: 02/24/2018 9:30 AM Medical Record Number: [id number] Patient Account Number: [id number] Date of Birth/Sex: 30-Dec-1931 (84 y.o. M) Treating RN: Montey Hora Primary Care Jaydalynn Olivero: Webb Silversmith Other Clinician: Referring Latanja Lehenbauer: Webb Silversmith Treating Elmina Hendel/Extender: Melburn Hake, HOYT Weeks in Treatment: 11 Active Problems Location of Pain Severity and Description of Pain Patient  Has Paino No Site Locations Pain Management and Medication Current Pain Management: Electronic Signature(s) Signed: 02/24/2018 3:06:22 PM By: Paulla Fore, RRT, CHT Signed: 02/24/2018 5:06:38 PM By: Montey Hora Entered By: Lorine Bears on 02/24/2018 09:24:54 [name] ([id number]) -------------------------------------------------------------------------------- Patient/Caregiver Education Details Patient Name: Shawn Bark T. Date of Service: 02/24/2018 9:30 AM Medical Record Number: [id number] Patient Account Number: [id number] Date of Birth/Gender: [birth year] (84 y.o. M) Treating RN: Montey Hora Primary Care Physician: Webb Silversmith Other Clinician: Referring Physician: Webb Silversmith Treating Physician/Extender: Sharalyn Ink in Treatment: 11 Education Assessment Education Provided To: Patient Education Topics Provided Wound/Skin Impairment: Handouts: Other: wound care as ordered Methods: Demonstration, Explain/Verbal Responses: State content correctly Electronic Signature(s) Signed: 02/24/2018 5:06:38 PM By: Montey Hora Entered By: Montey Hora on 02/24/2018 10:26:16 [name] ([id number]) -------------------------------------------------------------------------------- Wound Assessment Details Patient Name: Shawn Bark T. Date of Service: 02/24/2018 9:30 AM Medical Record Number: [id number] Patient Account Number: [id number] Date of Birth/Sex: 04/29/1931 (84 y.o. M) Treating RN: Secundino Ginger Primary Care Gaetan Spieker: Webb Silversmith Other Clinician: Referring Quintus Premo: Webb Silversmith Treating Preciliano Castell/Extender: STONE III, HOYT Weeks in Treatment: 11 Wound Status Wound Number: 3 Primary Venous Leg Ulcer Etiology: Wound Location: Left Lower Leg - Lateral Wound Open Wounding Event: Not Known Status: Date Acquired: 01/16/2018 Comorbid Chronic Obstructive Pulmonary Disease (COPD), Weeks Of Treatment:  3 History: Sleep Apnea, Arrhythmia, Congestive Heart Clustered Wound: No Failure, Hypertension, Myocardial Infarction, Type II Diabetes, Osteoarthritis, Neuropathy, Received Radiation Photos Photo Uploaded By: Secundino Ginger on 02/24/2018 09:50:28 Wound Measurements Length: (cm) 1.3 Width: (cm) 0.8 Depth: (cm) 0.1 Area: (cm) 0.817 Volume: (cm) 0.082 % Reduction in Area: 77.1% % Reduction in Volume: 77% Tunneling: No Undermining: No Wound Description Full Thickness Without Exposed Support Foul Odo Classification: Structures Slough/F Wound Margin: Flat and Intact Exudate Small Amount: Exudate Type: Serous Exudate Color: amber r After Cleansing: No ibrino Yes Wound Bed Granulation Amount: None Present (0%) Exposed Structure Necrotic Amount: Medium (34-66%) Fascia Exposed: No Necrotic Quality: Adherent Slough Fat Layer (Subcutaneous Tissue) Exposed: No Tendon Exposed: No Muscle Exposed: No [name], Shawn T. ([id number]) Joint Exposed: No Bone Exposed: No Periwound Skin Texture Texture Color No Abnormalities Noted: Yes No Abnormalities Noted: No Atrophie Blanche: No Moisture Cyanosis: No No Abnormalities Noted: No Ecchymosis: No Dry / Scaly: No Erythema: No Maceration: No Hemosiderin Staining: No Mottled: No Pallor: No Rubor: No Wound Preparation Ulcer Cleansing: Rinsed/Irrigated with Saline Topical Anesthetic Applied: Other: lidocaine 4%, Treatment Notes Wound #3 (Left, Lateral Lower Leg) Notes Silver Cell, gauze, Conform secured with Tubigrip. Electronic Signature(s) Signed: 02/24/2018 4:46:57 PM By: Secundino Ginger Entered By: Secundino Ginger on 02/24/2018 09:39:48 [name], [name] ([id number]) -------------------------------------------------------------------------------- [place] Details Patient Name: Shawn Bark T. Date of Service: 02/24/2018 9:30 AM Medical Record Number: [id number] Patient Account Number: [id number] Date of Birth/Sex: 07/10/1931 (84  y.o. M) Treating RN: Montey Hora Primary Care Hendry Speas: Webb Silversmith Other Clinician: Referring Samrat Hayward: Webb Silversmith Treating Kaylum Shrum/Extender: STONE III, HOYT Weeks in Treatment: 11 Vital Signs Time Taken: 09:24 Temperature (F): 97.5 Height (in): 66 Pulse (bpm): 70 Weight (lbs): 182  Respiratory Rate (breaths/min): 16 Body Mass Index (BMI): 29.4 Blood Pressure (mmHg): 108/63 Reference Range: 80 - 120 mg / dl Electronic Signature(s) Signed: 02/24/2018 3:06:22 PM By: Lorine Bears RCP, RRT, CHT Entered By: Becky Sax, Amado Nash on 02/24/2018 09:28:18

## 2018-03-01 NOTE — Progress Notes (Signed)
Shawn, [name] ([id number]) Visit Report for 02/24/2018 Chief Complaint Document Details Patient Name: [name], [name]. Date of Service: 02/24/2018 9:30 AM Medical Record Number: [id number] Patient Account Number: [id number] Date of Birth/Sex: 01-10-1932 (84 y.o. M) Treating RN: Montey Hora Primary Care Provider: Webb Silversmith Other Clinician: Referring Provider: Webb Silversmith Treating Provider/Extender: Melburn Hake, Caylea Foronda Weeks in Treatment: 11 Information Obtained from: Patient Chief Complaint Left lateral LE ulcer Electronic Signature(s) Signed: 02/25/2018 9:09:54 AM By: Worthy Keeler PA-C Entered By: Worthy Keeler on 02/24/2018 09:53:21 Teale, Gavin Pound ([id number]) -------------------------------------------------------------------------------- Debridement Details Patient Name: [name] T. Date of Service: 02/24/2018 9:30 AM Medical Record Number: [id number] Patient Account Number: [id number] Date of Birth/Sex: 01/17/1932 (84 y.o. M) Treating RN: Montey Hora Primary Care Provider: Webb Silversmith Other Clinician: Referring Provider: Webb Silversmith Treating Provider/Extender: Melburn Hake, Hawke Villalpando Weeks in Treatment: 11 Debridement Performed for Wound #3 Left,Lateral Lower Leg Assessment: Performed By: Physician STONE III, Andelyn Spade E., PA-C Debridement Type: Debridement Severity of Tissue Pre Fat layer exposed Debridement: Level of Consciousness (Pre- Awake and Alert procedure): Pre-procedure Verification/Time Yes - 10:01 Out Taken: Start Time: 10:01 Pain Control: Lidocaine 4% Topical Solution Total Area Debrided (L x W): 1.3 (cm) x 0.8 (cm) = 1.04 (cm) Tissue and other material Viable, Non-Viable, Slough, Subcutaneous, Slough debrided: Level: Skin/Subcutaneous Tissue Debridement Description: Excisional Instrument: Curette Bleeding: Minimum Hemostasis Achieved: Pressure End Time: 10:04 Procedural Pain: 0 Post Procedural Pain: 0 Response to Treatment:  Procedure was tolerated well Level of Consciousness Awake and Alert (Post-procedure): Post Debridement Measurements of Total Wound Length: (cm) 1.3 Width: (cm) 0.8 Depth: (cm) 0.2 Volume: (cm) 0.163 Character of Wound/Ulcer Post Debridement: Improved Severity of Tissue Post Debridement: Fat layer exposed Post Procedure Diagnosis Same as Pre-procedure Electronic Signature(s) Signed: 02/24/2018 5:06:38 PM By: Montey Hora Signed: 02/25/2018 9:09:54 AM By: Worthy Keeler PA-C Entered By: Montey Hora on 02/24/2018 10:03:03 [name] ([id number]) -------------------------------------------------------------------------------- HPI Details Patient Name: [name] T. Date of Service: 02/24/2018 9:30 AM Medical Record Number: [id number] Patient Account Number: [id number] Date of Birth/Sex: 14-Jul-1931 (84 y.o. M) Treating RN: Montey Hora Primary Care Provider: Webb Silversmith Other Clinician: Referring Provider: Webb Silversmith Treating Provider/Extender: Melburn Hake, Ranger Petrich Weeks in Treatment: 11 History of Present Illness Associated Signs and Symptoms: Patient has a history of lymphedema, type II diabetes mellitus, long-term use of Eliquis, congestive heart failure, and COPD. HPI Description: 12/06/17 on evaluation today patient is here for evaluation of an ulcer on the left lateral lower extremity which has been present for roughly 2-3 months. Currently he has been using silver alginate and it was considered putting the patient in an [employer] wrap. However he did have arterial studies which actually showed to be fairly normal on the right however on the left he did have an ABI of 0.75. With that being said this ABI was consistent with at least mild-to-moderate arterial disease. The patient actually has some question on the test as to whether or not this result was actually even accurate his arterial disease could be more significant. For that reason I think he may need to  see a vascular specialist to see about possibly undergoing an angiogram to see if arterial flow could be improved. With that being said it does appear that the patient rather desperately needs compression therapy he has significant edema of the bilateral lower extremities noted during evaluation today. No fevers, chills, nausea, or vomiting noted at this time. 12/16/17 on evaluation today patient actually appears to be  doing better in regard to his lower extremity ulcers of the left lower extremity. Both wounds are significantly smaller and improved compared to the initial evaluation last week. Overall I'm very pleased with the progress at this time. The patient likewise is pleased with how things appear. He states that he has thought about it and really does not want to have an angiogram that would prefer not to go see the vascular specialist which we had recommended last week. He states when he had this previous on top of the fact that he had issues with getting the groin area to stop bleeding he states that the die also seemed to cause issues in fact he states through him into having another heart attack. Obviously he has good reasons to want to try to avoid this which I completely understand. 01/02/18 on evaluation today patient actually appears to be showing signs of good improvement which is excellent news. He has been tolerating the dressing changes without complication. Unfortunately he was in the hospital due to an infection of his left forearm he was on IV antibiotics for six days. The good news is he seems to be doing much better in that regard his left lower extremity also is doing better at this point. There does not appear to be any evidence of infection at this time which is great news. Overall I'm very pleased with the progress he seems to be making. 01/30/18 patient has not been seen our office actually since 23 September. At that point following he apparently healed but then had  several areas that we opened shortly following. He has new areas in three different spots on evaluation today. This is on the left lower extremity were the ones previously were. With that being said there does not appear to be any evidence of infection at this time which is good news. No fevers, chills, nausea, or vomiting noted at this time. 02/10/18 on evaluation today patient actually appears to be doing excellent in regard to his lower extremities. He just has one wound that appears to still be open at this time. There does not appear to be any evidence of infection currently. With that being said he has been tolerating the compression that we been utilizing without complication. No fevers, chills, nausea, or vomiting noted at this time. 02/24/18 on evaluation today patient's lower extremity ulcer actually shows signs of good improvement at this time. Fortunately there is no evidence of infection. Overall he has some pain but nothing too significant. Electronic Signature(s) Signed: 02/25/2018 9:09:54 AM By: Worthy Keeler PA-C Entered By: Worthy Keeler on 02/24/2018 21:04:04 [name] ([id number]) -------------------------------------------------------------------------------- Physical Exam Details Patient Name: TOBEY, SCHMELZLE T. Date of Service: 02/24/2018 9:30 AM Medical Record Number: [id number] Patient Account Number: [id number] Date of Birth/Sex: 06/15/1931 (84 y.o. M) Treating RN: Montey Hora Primary Care Provider: Webb Silversmith Other Clinician: Referring Provider: Webb Silversmith Treating Provider/Extender: STONE III, Michell Kader Weeks in Treatment: 25 Constitutional Well-nourished and well-hydrated in no acute distress. Respiratory normal breathing without difficulty. Psychiatric this patient is able to make decisions and demonstrates good insight into disease process. Alert and Oriented x 3. pleasant and cooperative. Notes Patient's wound did require sharp debridement  today which he tolerated without complication post debridement the wound bed appears to be much better. Electronic Signature(s) Signed: 02/25/2018 9:09:54 AM By: Worthy Keeler PA-C Entered By: Worthy Keeler on 02/24/2018 21:10:39 [name] ([id number]) -------------------------------------------------------------------------------- Physician Orders Details Patient Name: [name] T. Date of  Service: 02/24/2018 9:30 AM Medical Record Number: [id number] Patient Account Number: [id number] Date of Birth/Sex: 14-May-1931 (84 y.o. 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 Anesthetic (add to Medication List) Wound #3 Left,Lateral Lower Leg o Topical Lidocaine 4% cream applied to wound bed prior to debridement (In Clinic Only). Primary Wound Dressing Wound #3 Left,Lateral Lower Leg o Silver Alginate Secondary Dressing Wound #3 Left,Lateral Lower Leg o Gauze and Kerlix/Conform Dressing Change Frequency Wound #3 Left,Lateral Lower Leg o Change Dressing Monday, Wednesday, Friday - Patient is going to return to clinic in 3 weeks so on weeks when patient is not seen in clinic, [facility] to visit patient 3 times on Monday, Wednesday and Friday; other weeks Monday and Wednesday Follow-up  Appointments Wound #3 Left,Lateral Lower Leg o Return Appointment in 2 weeks. - Patient is going to return to clinic in 3 weeks so on weeks when patient is not seen in clinic, [facility] to visit patient 3 times on Monday, Wednesday and Friday; other weeks Monday and Wednesday Edema Control Wound #3 Left,Lateral Lower Leg o Patient to wear own compression stockings - Tubigrip in clinic CYRUS, RAMSBURG ([id number]) [place] #3 [place] Visits - [facility]- Patient is going to return to clinic in 3 weeks so on weeks when patient is not seen in clinic, [facility] to visit patient 3 times on Monday, Wednesday and Friday; other weeks Monday and Wednesday o Home Health Nurse may visit PRN to address patientos wound care needs. o FACE TO FACE ENCOUNTER: MEDICARE and MEDICAID PATIENTS: I certify that this patient is under my care and that I had a face-to-face encounter that meets the physician face-to-face encounter requirements with this patient on this date. The encounter with the patient was in whole or in part for the following MEDICAL CONDITION: (primary reason for [place]) MEDICAL NECESSITY: I certify, that based on my findings, NURSING services are a medically necessary home health service. HOME BOUND STATUS: I certify that my clinical findings support that this patient is homebound (i.e., Due to illness or injury, pt requires aid of supportive devices such as crutches, cane, wheelchairs, walkers, the use of special transportation or the assistance of another person to leave their place of residence. There is a normal inability to leave the home and doing so requires considerable and taxing effort. Other absences are for medical reasons / religious services and are infrequent or of short duration when for other reasons). o If current dressing causes regression in wound condition, may D/C ordered dressing product/s and apply Normal Saline  Moist Dressing daily until next [place] / Other MD appointment. [place] of regression in wound condition at [phone]. o Please direct any NON-WOUND related issues/requests for orders to patient's Primary Care Physician Electronic Signature(s) Signed: 02/24/2018 5:06:38 PM By: Montey Hora Signed: 02/25/2018 9:09:54 AM By: Worthy Keeler PA-C Entered By: Montey Hora on 02/24/2018 10:04:17 Thornton, Herbie [place]  T. ([id number]) -------------------------------------------------------------------------------- Problem List Details Patient Name: MACLOVIO, HENSON T. Date of Service: 02/24/2018 9:30 AM Medical Record Number: [id number] Patient Account Number: [id number] Date of Birth/Sex: [birth year] (84 y.o. M) Treating RN: Montey Hora Primary Care Provider: Webb Silversmith Other Clinician: Referring Provider: Webb Silversmith Treating Provider/Extender: Melburn Hake, Michiel Sivley Weeks in Treatment: 11 Active Problems ICD-10 Evaluated Encounter Code Description Active Date Today Diagnosis I89.0 Lymphedema, not elsewhere classified 12/06/2017 No Yes L97.822 Non-pressure chronic ulcer of other part of left lower leg with 12/06/2017 No Yes fat layer exposed E11.622 Type 2 diabetes mellitus with other skin ulcer 12/06/2017 No Yes Z79.01 Long term (current) use of anticoagulants 12/06/2017 No Yes I50.42 Chronic combined systolic (congestive) and diastolic 1/82/9937 No Yes (congestive) heart failure J44.9 Chronic obstructive pulmonary disease, unspecified 12/06/2017 No Yes Inactive Problems Resolved Problems Electronic Signature(s) Signed: 02/25/2018 9:09:54 AM By: Worthy Keeler PA-C Entered By: Worthy Keeler on 02/24/2018 09:53:13 Kristiansen, Gavin Pound ([id number]) -------------------------------------------------------------------------------- Progress Note Details Patient Name: [name] T. Date of Service: 02/24/2018 9:30 AM Medical Record Number:  [id number] Patient Account Number: [id number] Date of Birth/Sex: [birth year] (84 y.o. M) Treating RN: Montey Hora Primary Care Provider: Webb Silversmith Other Clinician: Referring Provider: Webb Silversmith Treating Provider/Extender: Melburn Hake, Kedar Sedano Weeks in Treatment: 11 Subjective Chief Complaint Information obtained from Patient Left lateral LE ulcer History of Present Illness (HPI) The following HPI elements were documented for the patient's wound: Associated Signs and Symptoms: Patient has a history of lymphedema, type II diabetes mellitus, long-term use of Eliquis, congestive heart failure, and COPD. 12/06/17 on evaluation today patient is here for evaluation of an ulcer on the left lateral lower extremity which has been present for roughly 2-3 months. Currently he has been using silver alginate and it was considered putting the patient in an [employer] wrap. However he did have arterial studies which actually showed to be fairly normal on the right however on the left he did have an ABI of 0.75. With that being said this ABI was consistent with at least mild-to-moderate arterial disease. The patient actually has some question on the test as to whether or not this result was actually even accurate his arterial disease could be more significant. For that reason I think he may need to see a vascular specialist to see about possibly undergoing an angiogram to see if arterial flow could be improved. With that being said it does appear that the patient rather desperately needs compression therapy he has significant edema of the bilateral lower extremities noted during evaluation today. No fevers, chills, nausea, or vomiting noted at this time. 12/16/17 on evaluation today patient actually appears to be doing better in regard to his lower extremity ulcers of the left lower extremity. Both wounds are significantly smaller and improved compared to the initial evaluation last week. Overall I'm  very pleased with the progress at this time. The patient likewise is pleased with how things appear. He states that he has thought about it and really does not want to have an angiogram that would prefer not to go see the vascular specialist which we had recommended last week. He states when he had this previous on top of the fact that he had issues with getting the groin area to stop bleeding he states that the die also seemed to cause issues in fact he states through him into having another heart attack. Obviously he has good reasons to want to try to avoid this which I completely understand. 01/02/18 on  evaluation today patient actually appears to be showing signs of good improvement which is excellent news. He has been tolerating the dressing changes without complication. Unfortunately he was in the hospital due to an infection of his left forearm he was on IV antibiotics for six days. The good news is he seems to be doing much better in that regard his left lower extremity also is doing better at this point. There does not appear to be any evidence of infection at this time which is great news. Overall I'm very pleased with the progress he seems to be making. 01/30/18 patient has not been seen our office actually since 23 September. At that point following he apparently healed but then had several areas that we opened shortly following. He has new areas in three different spots on evaluation today. This is on the left lower extremity were the ones previously were. With that being said there does not appear to be any evidence of infection at this time which is good news. No fevers, chills, nausea, or vomiting noted at this time. 02/10/18 on evaluation today patient actually appears to be doing excellent in regard to his lower extremities. He just has one wound that appears to still be open at this time. There does not appear to be any evidence of infection currently. With that being said he has  been tolerating the compression that we been utilizing without complication. No fevers, chills, nausea, or vomiting noted at this time. 02/24/18 on evaluation today patient's lower extremity ulcer actually shows signs of good improvement at this time. Fortunately there is no evidence of infection. Overall he has some pain but nothing too significant. LEONTE, HORRIGAN ([id number]) Patient History Information obtained from Patient. Family History Diabetes - Maternal Grandparents, Heart Disease - Paternal Grandparents, Hypertension - Paternal Grandparents, Lung Disease - Mother, No family history of Cancer, Hereditary Spherocytosis, Kidney Disease, Seizures, Stroke, Thyroid Problems, Tuberculosis. Social History Former smoker - quit in 1993, Marital Status - Married, Alcohol Use - Never, Drug Use - No History, Caffeine Use - Daily. Medical And Surgical History Notes Neurologic CVA in 1999 Review of Systems (ROS) Constitutional Symptoms (General Health) Denies complaints or symptoms of Fever, Chills. Respiratory The patient has no complaints or symptoms. Cardiovascular Complains or has symptoms of LE edema. Psychiatric The patient has no complaints or symptoms. Objective Constitutional Well-nourished and well-hydrated in no acute distress. Vitals Time Taken: 9:24 AM, Height: 66 in, Weight: 182 lbs, BMI: 29.4, Temperature: 97.5 F, Pulse: 70 bpm, Respiratory Rate: 16 breaths/min, Blood Pressure: 108/63 mmHg. Respiratory normal breathing without difficulty. Psychiatric this patient is able to make decisions and demonstrates good insight into disease process. Alert and Oriented x 3. pleasant and cooperative. General Notes: Patient's wound did require sharp debridement today which he tolerated without complication post debridement the wound bed appears to be much better. Integumentary (Hair, Skin) Wound #3 status is Open. Original cause of wound was Not Known. The wound is located on  the Left,Lateral Lower Leg. The wound measures 1.3cm length x 0.8cm width x 0.1cm depth; 0.817cm^2 area and 0.082cm^3 volume. There is no tunneling or undermining noted. There is a small amount of serous drainage noted. The wound margin is flat and intact. There is no granulation within the wound bed. There is a medium (34-66%) amount of necrotic tissue within the wound bed including Adherent Slough. The periwound skin appearance had no abnormalities noted for texture. The periwound skin appearance Rosengren, Shemuel T. ([id number]) did not exhibit: Dry/Scaly,  Maceration, Atrophie Blanche, Cyanosis, Ecchymosis, Hemosiderin Staining, Mottled, Pallor, Rubor, Erythema. Assessment Active Problems ICD-10 Lymphedema, not elsewhere classified Non-pressure chronic ulcer of other part of left lower leg with fat layer exposed Type 2 diabetes mellitus with other skin ulcer Long term (current) use of anticoagulants Chronic combined systolic (congestive) and diastolic (congestive) heart failure Chronic obstructive pulmonary disease, unspecified Procedures Wound #3 Pre-procedure diagnosis of Wound #3 is a Venous Leg Ulcer located on the Left,Lateral Lower Leg .Severity of Tissue Pre Debridement is: Fat layer exposed. There was a Excisional Skin/Subcutaneous Tissue Debridement with a total area of 1.04 sq cm performed by STONE III, Shiori Adcox E., PA-C. With the following instrument(s): Curette to remove Viable and Non-Viable tissue/material. Material removed includes Subcutaneous Tissue and Slough and after achieving pain control using Lidocaine 4% Topical Solution. No specimens were taken. A time out was conducted at 10:01, prior to the start of the procedure. A Minimum amount of bleeding was controlled with Pressure. The procedure was tolerated well with a pain level of 0 throughout and a pain level of 0 following the procedure. Post Debridement Measurements: 1.3cm length x 0.8cm width x 0.2cm depth; 0.163cm^3  volume. Character of Wound/Ulcer Post Debridement is improved. Severity of Tissue Post Debridement is: Fat layer exposed. Post procedure Diagnosis Wound #3: Same as Pre-Procedure Plan Wound Cleansing: Wound #3 Left,Lateral Lower Leg: Clean wound with Normal Saline. Anesthetic (add to Medication List): Wound #3 Left,Lateral Lower Leg: Topical Lidocaine 4% cream applied to wound bed prior to debridement (In Clinic Only). Primary Wound Dressing: Wound #3 Left,Lateral Lower Leg: Silver Alginate Secondary Dressing: Wound #3 Left,Lateral Lower Leg: Gauze and Kerlix/Conform Dressing Change Frequency: Wound #3 Left,Lateral Lower Leg: AMARE, BAIL ([id number]) Change Dressing Monday, Wednesday, Friday - Patient is going to return to clinic in 3 weeks so on weeks when patient is not seen in clinic, [facility] to visit patient 3 times on Monday, Wednesday and Friday; other weeks Monday and Wednesday Follow-up Appointments: Wound #3 Left,Lateral Lower Leg: Return Appointment in 2 weeks. - Patient is going to return to clinic in 3 weeks so on weeks when patient is not seen in clinic, [facility] to visit patient 3 times on Monday, Wednesday and Friday; other weeks Monday and Wednesday Edema Control: Wound #3 Left,Lateral Lower Leg: Patient to wear own compression stockings - Tubigrip in clinic Home Health: Wound #3 Left,Lateral Lower Leg: Continue Home Health Visits - [facility]- Patient is going to return to clinic in 3 weeks so on weeks when patient is not seen in clinic, [facility] to visit patient 3 times on Monday, Wednesday and Friday; other weeks Monday and Wednesday Home Health Nurse may visit PRN to address patient s wound care needs. FACE TO FACE ENCOUNTER: MEDICARE and MEDICAID PATIENTS: I certify that this patient is under my care and that I had a face-to-face encounter that meets the physician face-to-face encounter requirements with this patient on this date. The encounter with the patient was  in whole or in part for the following MEDICAL CONDITION: (primary reason for [place]) MEDICAL NECESSITY: I certify, that based on my findings, NURSING services are a medically necessary home health service. HOME BOUND STATUS: I certify that my clinical findings support that this patient is homebound (i.e., Due to illness or injury, pt requires aid of supportive devices such as crutches, cane, wheelchairs, walkers, the use of special transportation or the assistance of another person to leave their place of residence. There is a normal inability to leave the home  and doing so requires considerable and taxing effort. Other absences are for medical reasons / religious services and are infrequent or of short duration when for other reasons). If current dressing causes regression in wound condition, may D/C ordered dressing product/s and apply Normal Saline Moist Dressing daily until next [place] / Other MD appointment. Sula of regression in wound condition at [phone]. Please direct any NON-WOUND related issues/requests for orders to patient's Primary Care Physician We will continue with the above wound care measures for the time being since the patient seems to be doing so well. If anything changes or worsens will let me know. Please see above for specific wound care orders. We will see patient for re-evaluation in 3 week(s) here in the clinic. If anything worsens or changes patient will contact our office for additional recommendations. Electronic Signature(s) Signed: 02/25/2018 9:09:54 AM By: Worthy Keeler PA-C Entered By: Worthy Keeler on 02/24/2018 21:11:21 [name] ([id number]) -------------------------------------------------------------------------------- ROS/PFSH Details Patient Name: [name] T. Date of Service: 02/24/2018 9:30 AM Medical Record Number: [id number] Patient Account Number: [id number] Date of Birth/Sex:  1931/08/24 (84 y.o. M) Treating RN: Montey Hora Primary Care Provider: Webb Silversmith Other Clinician: Referring Provider: Webb Silversmith Treating Provider/Extender: Melburn Hake, Kennley Schwandt Weeks in Treatment: 11 Information Obtained From Patient Wound History Do you currently have one or more open woundso Yes How many open wounds do you currently haveo 1 Approximately how long have you had your woundso 2 months How have you been treating your wound(s) until nowo silver alginate Has your wound(s) ever healed and then re-openedo No Have you had any lab work done in the past montho Yes Who ordered the lab work doneo PCP Have you tested positive for an antibiotic resistant organism (MRSA, VRE)o No Have you tested positive for osteomyelitis (bone infection)o No Have you had any tests for circulation on your legso No Have you had other problems associated with your woundso Infection, Swelling Constitutional Symptoms (General Health) Complaints and Symptoms: Negative for: Fever; Chills Cardiovascular Complaints and Symptoms: Positive for: LE edema Medical History: Positive for: Arrhythmia - a fib; Congestive Heart Failure; Hypertension; Myocardial Infarction - 1997 Negative for: Angina; Coronary Artery Disease; Deep Vein Thrombosis; Hypotension; Peripheral Arterial Disease; Peripheral Venous Disease; Phlebitis; Vasculitis Eyes Medical History: Negative for: Cataracts; Glaucoma; Optic Neuritis Ear/Nose/Mouth/Throat Medical History: Negative for: Chronic sinus problems/congestion; Middle ear problems Hematologic/Lymphatic Medical History: Negative for: Anemia; Hemophilia; Human Immunodeficiency Virus; Lymphedema; Sickle Cell Disease Respiratory Complaints and Symptoms: No Complaints or Symptoms RODD, HEFT T. ([id number]) Medical History: Positive for: Chronic Obstructive Pulmonary Disease (COPD); Sleep Apnea - CPAP Negative for: Aspiration; Asthma; Pneumothorax;  Tuberculosis Gastrointestinal Medical History: Negative for: Cirrhosis ; Colitis; Crohnos; Hepatitis A; Hepatitis B; Hepatitis C Endocrine Medical History: Positive for: Type II Diabetes Negative for: Type I Diabetes Treated with: Insulin, Oral agents Blood sugar tested every day: Yes Tested : QD Genitourinary Medical History: Negative for: End Stage Renal Disease Immunological Medical History: Negative for: Lupus Erythematosus; Raynaudos; Scleroderma Integumentary (Skin) Medical History: Negative for: History of Burn; History of pressure wounds Musculoskeletal Medical History: Positive for: Osteoarthritis Negative for: Gout; Rheumatoid Arthritis; Osteomyelitis Neurologic Medical History: Positive for: Neuropathy Negative for: Dementia; Quadriplegia; Paraplegia; Seizure Disorder Past Medical History Notes: CVA in [place] History: Positive for: Received Radiation - 5 treatments in chest Negative for: Received Chemotherapy Psychiatric Complaints and Symptoms: No Complaints or Symptoms Medical History: KIEON, LAWHORN ([id number]) Negative for: Anorexia/bulimia; Confinement Anxiety Immunizations  Pneumococcal Vaccine: Received Pneumococcal Vaccination: Yes Implantable Devices Family and Social History Cancer: No; Diabetes: Yes - Maternal Grandparents; Heart Disease: Yes - Paternal Grandparents; Hereditary Spherocytosis: No; Hypertension: Yes - Paternal Grandparents; Kidney Disease: No; Lung Disease: Yes - Mother; Seizures: No; Stroke: No; Thyroid Problems: No; Tuberculosis: No; Former smoker - quit in 1993; Marital Status - Married; Alcohol Use: Never; Drug Use: No History; Caffeine Use: Daily; Financial Concerns: No; Food, Clothing or Shelter Needs: No; Support System Lacking: No; Transportation Concerns: No; Advanced Directives: No; Patient does not want information on Advanced Directives Physician Affirmation I have reviewed and agree with the above  information. Electronic Signature(s) Signed: 02/25/2018 9:09:54 AM By: Worthy Keeler PA-C Signed: 02/27/2018 4:40:46 PM By: Montey Hora Entered By: Worthy Keeler on 02/24/2018 21:06:20 [name] ([id number]) -------------------------------------------------------------------------------- SuperBill Details Patient Name: [name] T. Date of Service: 02/24/2018 Medical Record Number: [id number] Patient Account Number: [id number] Date of Birth/Sex: Jul 04, 1931 (84 y.o. M) Treating RN: Montey Hora Primary Care Provider: Webb Silversmith Other Clinician: Referring Provider: Webb Silversmith Treating Provider/Extender: Melburn Hake, Azelyn Batie Weeks in Treatment: 11 Diagnosis Coding ICD-10 Codes Code Description I89.0 Lymphedema, not elsewhere classified L97.822 Non-pressure chronic ulcer of other part of left lower leg with fat layer exposed E11.622 Type 2 diabetes mellitus with other skin ulcer Z79.01 Long term (current) use of anticoagulants I50.42 Chronic combined systolic (congestive) and diastolic (congestive) heart failure J44.9 Chronic obstructive pulmonary disease, unspecified Facility Procedures CPT4 Code Description: [id number] 11042 - DEB SUBQ TISSUE 20 SQ CM/< ICD-10 Diagnosis Description L97.822 Non-pressure chronic ulcer of other part of left lower leg with Modifier: fat layer expos Quantity: 1 ed Physician Procedures CPT4 Code Description: [id number] - WC PHYS SUBQ TISS 20 SQ CM ICD-10 Diagnosis Description L97.822 Non-pressure chronic ulcer of other part of left lower leg with Modifier: fat layer expos Quantity: 1 ed Electronic Signature(s) Signed: 02/25/2018 9:09:54 AM By: Worthy Keeler PA-C Entered By: Worthy Keeler on 02/24/2018 21:12:22

## 2018-03-13 ENCOUNTER — Other Ambulatory Visit: Payer: Self-pay

## 2018-03-13 MED ORDER — METOLAZONE 5 MG PO TABS: 5.0000 mg | ORAL_TABLET | Freq: Every day | ORAL | 3 refills | Status: DC | PRN

## 2018-03-17 ENCOUNTER — Encounter: Payer: Medicare Other | Attending: Physician Assistant | Admitting: Physician Assistant

## 2018-03-17 DIAGNOSIS — J449 Chronic obstructive pulmonary disease, unspecified: Secondary | ICD-10-CM | POA: Insufficient documentation

## 2018-03-17 DIAGNOSIS — I252 Old myocardial infarction: Secondary | ICD-10-CM | POA: Insufficient documentation

## 2018-03-17 DIAGNOSIS — E11622 Type 2 diabetes mellitus with other skin ulcer: Secondary | ICD-10-CM | POA: Diagnosis not present

## 2018-03-17 DIAGNOSIS — E1140 Type 2 diabetes mellitus with diabetic neuropathy, unspecified: Secondary | ICD-10-CM | POA: Insufficient documentation

## 2018-03-17 DIAGNOSIS — I5042 Chronic combined systolic (congestive) and diastolic (congestive) heart failure: Secondary | ICD-10-CM | POA: Insufficient documentation

## 2018-03-17 DIAGNOSIS — I110 Hypertensive heart disease with heart failure: Secondary | ICD-10-CM | POA: Diagnosis not present

## 2018-03-17 DIAGNOSIS — L97822 Non-pressure chronic ulcer of other part of left lower leg with fat layer exposed: Secondary | ICD-10-CM | POA: Diagnosis present

## 2018-03-20 NOTE — Progress Notes (Signed)
[name], [name] ([id number]) Visit Report for 03/17/2018 Arrival Information Details Patient Name: [name], [name]. Date of Service: 03/17/2018 10:15 AM Medical Record Number: [id number] Patient Account Number: [id number] Date of Birth/Sex: 10/13/1931 (84 y.o. M) Treating RN: [name] Primary Care [name]: [name] Other Clinician: Referring [name]: [name] Treating [name]/Extender: [name], [name] Weeks in Treatment: 14 Visit Information History Since Last Visit Added or deleted any medications: No Patient Arrived: Walker Any new allergies or adverse reactions: No Arrival Time: 10:27 Had a fall or experienced change in No Accompanied By: son activities of daily living that may affect Transfer Assistance: None risk of falls: Patient Identification Verified: Yes Signs or symptoms of abuse/neglect since last visito No Secondary Verification Process Yes Hospitalized since last visit: No Completed: Implantable device outside of the clinic excluding No Patient Has Alerts: Yes cellular tissue based products placed in the center Patient Alerts: Patient on Blood since last visit: Thinner Has Dressing in Place as Prescribed: Yes Eliquis Pain Present Now: No DMII ABI [place] BILATERAL Electronic Signature(s) Signed: 03/17/2018 5:02:10 PM By: [name] Entered By: [name] on 03/17/2018 10:29:23 [name] ([id number]) -------------------------------------------------------------------------------- Clinic Level of Care Assessment Details Patient Name: Shawn Bark T. Date of Service: 03/17/2018 10:15 AM Medical Record Number: [id number] Patient Account Number: [id number] Date of Birth/Sex: 06/02/1931 (84 y.o. M) Treating RN: [name] Primary Care Charlei Ramsaran: [name] Other Clinician: Referring [name]: [name] Treating [name]/Extender: [name], [name] Weeks in Treatment: 14 Clinic Level of Care Assessment Items TOOL 4 Quantity Score []  -  Use when only an EandM is performed on FOLLOW-UP visit 0 ASSESSMENTS - Nursing Assessment / Reassessment X - Reassessment of Co-morbidities (includes updates in patient status) 1 10 X- 1 5 Reassessment of Adherence to Treatment Plan ASSESSMENTS - Wound and Skin Assessment / Reassessment X - Simple Wound Assessment / Reassessment - one wound 1 5 []  - 0 Complex Wound Assessment / Reassessment - multiple wounds []  - 0 Dermatologic / Skin Assessment (not related to wound area) ASSESSMENTS - Focused Assessment []  - Circumferential Edema Measurements - multi extremities 0 []  - 0 Nutritional Assessment / Counseling / Intervention X- 1 5 Lower Extremity Assessment (monofilament, tuning fork, pulses) []  - 0 Peripheral Arterial Disease Assessment (using hand held doppler) ASSESSMENTS - Ostomy and/or Continence Assessment and Care []  - Incontinence Assessment and Management 0 []  - 0 Ostomy Care Assessment and Management (repouching, etc.) PROCESS - Coordination of Care X - Simple Patient / Family Education for ongoing care 1 15 []  - 0 Complex (extensive) Patient / Family Education for ongoing care []  - 0 Staff obtains Programmer, systems, Records, Test Results / Process Orders []  - 0 Staff telephones HHA, Nursing Homes / Clarify orders / etc []  - 0 Routine Transfer to another Facility (non-emergent condition) []  - 0 Routine Hospital Admission (non-emergent condition) []  - 0 New Admissions / Biomedical engineer / Ordering NPWT, Apligraf, etc. []  - 0 Emergency Hospital Admission (emergent condition) X- 1 10 Simple Discharge Coordination MYRLE, DUES T. ([id number]) []  - 0 Complex (extensive) Discharge Coordination PROCESS - Special Needs []  - Pediatric / Minor Patient Management 0 []  - 0 Isolation Patient Management []  - 0 Hearing / Language / Visual special needs []  - 0 Assessment of Community assistance (transportation, D/C planning, etc.) []  - 0 Additional assistance / Altered  mentation []  - 0 Support Surface(s) Assessment (bed, cushion, seat, etc.) INTERVENTIONS - Wound Cleansing / Measurement X - Simple Wound Cleansing - one wound 1  5 []  - 0 Complex Wound Cleansing - multiple wounds X- 1 5 Wound Imaging (photographs - any number of wounds) []  - 0 Wound Tracing (instead of photographs) X- 1 5 Simple Wound Measurement - one wound []  - 0 Complex Wound Measurement - multiple wounds INTERVENTIONS - Wound Dressings []  - Small Wound Dressing one or multiple wounds 0 X- 1 15 Medium Wound Dressing one or multiple wounds []  - 0 Large Wound Dressing one or multiple wounds []  - 0 Application of Medications - topical []  - 0 Application of Medications - injection INTERVENTIONS - Miscellaneous []  - External ear exam 0 []  - 0 Specimen Collection (cultures, biopsies, blood, body fluids, etc.) []  - 0 Specimen(s) / Culture(s) sent or taken to Lab for analysis []  - 0 Patient Transfer (multiple staff / Civil Service fast streamer / Similar devices) []  - 0 Simple Staple / Suture removal (25 or less) []  - 0 Complex Staple / Suture removal (26 or more) []  - 0 Hypo / Hyperglycemic Management (close monitor of Blood Glucose) []  - 0 Ankle / Brachial Index (ABI) - do not check if billed separately X- 1 5 Vital Signs [name], Shawn T. ([id number]) Has the patient been seen at the hospital within the last three years: Yes Total Score: 85 Level Of Care: New/Established - Level 3 Electronic Signature(s) Signed: 03/17/2018 3:45:44 PM By: [name] Entered By: [name] on 03/17/2018 11:01:37 [name] ([id number]) -------------------------------------------------------------------------------- Encounter Discharge Information Details Patient Name: Shawn Bark T. Date of Service: 03/17/2018 10:15 AM Medical Record Number: [id number] Patient Account Number: [id number] Date of Birth/Sex: March 26, 1932 (84 y.o. M) Treating RN: [name] Primary Care Coralyn Roselli:  [name] Other Clinician: Referring Deliyah Muckle: [name] Treating Raymont Andreoni/Extender: [name], [name] Weeks in Treatment: 14 Encounter Discharge Information Items Discharge Condition: Stable Ambulatory Status: Walker Discharge Destination: Home Transportation: Private Auto Accompanied By: son Schedule Follow-up Appointment: Yes Clinical Summary of Care: Electronic Signature(s) Signed: 03/17/2018 3:45:44 PM By: [name] Entered By: [name] on 03/17/2018 12:14:57 [name] ([id number]) -------------------------------------------------------------------------------- Lower Extremity Assessment Details Patient Name: Shawn Bark T. Date of Service: 03/17/2018 10:15 AM Medical Record Number: [id number] Patient Account Number: [id number] Date of Birth/Sex: 07/24/1931 (84 y.o. M) Treating RN: [name] Primary Care Edgar Reisz: [name] Other Clinician: Referring Azad Calame: [name] Treating Deshannon Seide/Extender: [name], [name] Weeks in Treatment: 14 Edema Assessment Assessed: [Left: No] [Right: No] Edema: [Left: N] [Right: o] Calf Left: Right: Point of Measurement: 30 cm From Medial Instep 34 cm cm Ankle Left: Right: Point of Measurement: 10 cm From Medial Instep 23 cm cm Vascular Assessment Claudication: Claudication Assessment [Left:None] Pulses: Dorsalis Pedis Palpable: [Left:Yes] Posterior Tibial Extremity colors, hair growth, and conditions: Extremity Color: [Left:Red] Hair Growth on Extremity: [Left:No] Temperature of Extremity: [Left:Warm] Capillary Refill: [Left:< 3 seconds] Toe Nail Assessment Left: Right: Thick: Yes Discolored: No Deformed: No Improper Length and Hygiene: No Electronic Signature(s) Signed: 03/17/2018 5:02:10 PM By: [name] Entered By: [name] on 03/17/2018 10:37:57 [name], [name] ([id number]) -------------------------------------------------------------------------------- Multi Wound Chart  Details Patient Name: Shawn Bark T. Date of Service: 03/17/2018 10:15 AM Medical Record Number: [id number] Patient Account Number: [id number] Date of Birth/Sex: March 07, 1932 (84 y.o. M) Treating RN: [name] Primary Care Leonell Lobdell: [name] Other Clinician: Referring Jeyda Siebel: [name] Treating Darshay Deupree/Extender: [name], [name] Weeks in Treatment: 14 Vital Signs Height(in): 66 Pulse(bpm): 56 Weight(lbs): 182 Blood Pressure(mmHg): 101/62 Body Mass Index(BMI): 29 Temperature(F): 97.8 Respiratory Rate 16 (breaths/min): Photos: [3:No Photos] [N/A:N/A] Wound Location: [3:Left Lower  Leg - Lateral] [N/A:N/A] Wounding Event: [3:Not Known] [N/A:N/A] Primary Etiology: [3:Venous Leg Ulcer] [N/A:N/A] Comorbid History: [3:Chronic Obstructive Pulmonary Disease (COPD), Sleep Apnea, Arrhythmia, Congestive Heart Failure, Hypertension, Myocardial Infarction, Type II Diabetes, Osteoarthritis, Neuropathy, Received Radiation] [N/A:N/A] Date Acquired: [3:01/16/2018] [N/A:N/A] Weeks of Treatment: [3:6] [N/A:N/A] Wound Status: [3:Open] [N/A:N/A] Measurements L x W x D [3:0.8x0.4x0.1] [N/A:N/A] (cm) Area (cm) : [3:0.251] [N/A:N/A] Volume (cm) : [3:0.025] [N/A:N/A] % Reduction in Area: [3:93.00%] [N/A:N/A] % Reduction in Volume: [3:93.00%] [N/A:N/A] Classification: [3:Full Thickness Without Exposed Support Structures] [N/A:N/A] Exudate Amount: [3:Small] [N/A:N/A] Exudate Type: [3:Serous] [N/A:N/A] Exudate Color: [3:amber] [N/A:N/A] Wound Margin: [3:Flat and Intact] [N/A:N/A] Granulation Amount: [3:Large (67-100%)] [N/A:N/A] Granulation Quality: [3:Pink] [N/A:N/A] Necrotic Amount: [3:None Present (0%)] [N/A:N/A] Exposed Structures: [3:Fat Layer (Subcutaneous Tissue) Exposed: Yes Fascia: No Tendon: No Muscle: No Joint: No Bone: No] [N/A:N/A] Periwound Skin Texture: Excoriation: No N/A N/A Induration: No Callus: No Crepitus: No Rash: No Scarring: No Periwound Skin  Moisture: Maceration: No N/A N/A Dry/Scaly: No Periwound Skin Color: Erythema: Yes N/A N/A Atrophie Blanche: No Cyanosis: No Ecchymosis: No Hemosiderin Staining: No Mottled: No Pallor: No Rubor: No Erythema Location: Circumferential N/A N/A Tenderness on Palpation: No N/A N/A Wound Preparation: Ulcer Cleansing: N/A N/A Rinsed/Irrigated with Saline Topical Anesthetic Applied: Other: lidocaine 4% Treatment Notes Electronic Signature(s) Signed: 03/17/2018 3:45:44 PM By: [name] Entered By: [name] on 03/17/2018 10:44:39 [name] ([id number]) -------------------------------------------------------------------------------- [place] Details Patient Name: Shawn Bark T. Date of Service: 03/17/2018 10:15 AM Medical Record Number: [id number] Patient Account Number: [id number] Date of Birth/Sex: 24-Feb-1932 (84 y.o. M) Treating RN: [name] Primary Care Elana Jian: [name] Other Clinician: Referring Jaskirat Schwieger: [name] Treating Tonie Elsey/Extender: [name], [name] Weeks in Treatment: 14 Active Inactive Abuse / Safety / Falls / Self Care Management Nursing Diagnoses: Potential for falls Goals: Patient will not experience any injury related to falls Date Initiated: 12/06/2017 Target Resolution Date: 03/18/2018 Goal Status: Active Interventions: Assess Activities of Daily Living upon admission and as needed Assess fall risk on admission and as needed Assess: immobility, friction, shearing, incontinence upon admission and as needed Assess impairment of mobility on admission and as needed per policy Assess personal safety and home safety (as indicated) on admission and as needed Assess self care needs on admission and as needed Notes: Nutrition Nursing Diagnoses: Imbalanced nutrition Impaired glucose control: actual or potential Potential for alteratiion in Nutrition/Potential for imbalanced  nutrition Goals: Patient/caregiver agrees to and verbalizes understanding of need to use nutritional supplements and/or vitamins as prescribed Date Initiated: 12/06/2017 Target Resolution Date: 03/18/2018 Goal Status: Active Patient/caregiver will maintain therapeutic glucose control Date Initiated: 12/06/2017 Target Resolution Date: 03/18/2018 Goal Status: Active Interventions: Assess patient nutrition upon admission and as needed per policy Provide education on elevated blood sugars and impact on wound healing Provide education on nutrition [name], [name] ([id number]) Notes: Orientation to the Wound Care Program Nursing Diagnoses: Knowledge deficit related to the wound healing center program Goals: Patient/caregiver will verbalize understanding of the [facility] Date Initiated: 12/06/2017 Target Resolution Date: 12/17/2017 Goal Status: Active Interventions: Provide education on orientation to the wound center Notes: Wound/Skin Impairment Nursing Diagnoses: Impaired tissue integrity Knowledge deficit related to ulceration/compromised skin integrity Goals: Ulcer/skin breakdown will have a volume reduction of 80% by week 12 Date Initiated: 12/06/2017 Target Resolution Date: 03/11/2018 Goal Status: Active Interventions: Assess patient/caregiver ability to perform ulcer/skin care regimen upon admission and as needed Assess ulceration(s) every visit Notes: Electronic Signature(s) Signed: 03/17/2018 3:45:44 PM By:  [name], [name] Entered By: [name] on 03/17/2018 10:43:26 [name], [name] ([id number]) -------------------------------------------------------------------------------- Pain Assessment Details Patient Name: [name], Shawn T. Date of Service: 03/17/2018 10:15 AM Medical Record Number: [id number] Patient Account Number: [id number] Date of Birth/Sex: Aug 07, 1931 (84 y.o. M) Treating RN: [name] Primary Care Kristyana Notte: [name] Other  Clinician: Referring Nakota Elsen: [name] Treating Pravin Perezperez/Extender: [name], [name] Weeks in Treatment: 14 Active Problems Location of Pain Severity and Description of Pain Patient Has Paino No Site Locations Pain Management and Medication Current Pain Management: Goals for Pain Management pt denies any pain at this time. Electronic Signature(s) Signed: 03/17/2018 5:02:10 PM By: [name] Entered By: [name] on 03/17/2018 10:30:31 [name] ([id number]) -------------------------------------------------------------------------------- Patient/Caregiver Education Details Patient Name: Shawn Bark T. Date of Service: 03/17/2018 10:15 AM Medical Record Number: [id number] Patient Account Number: [id number] Date of Birth/Gender: [birth year] (84 y.o. M) Treating RN: [name] Primary Care Physician: [name] Other Clinician: Referring Physician: Webb [name] Treating Physician/Extender: Sharalyn Ink in Treatment: 14 Education Assessment Education Provided To: Patient and Caregiver Education Topics Provided Venous: Handouts: Other: leg elevation Methods: Explain/Verbal Responses: State content correctly Electronic Signature(s) Signed: 03/17/2018 3:45:44 PM By: [name] Entered By: [name] on 03/17/2018 10:45:50 [name], [name] ([id number]) -------------------------------------------------------------------------------- Wound Assessment Details Patient Name: Shawn Bark T. Date of Service: 03/17/2018 10:15 AM Medical Record Number: [id number] Patient Account Number: [id number] Date of Birth/Sex: 1931/11/07 (84 y.o. M) Treating RN: [name] Primary Care Natacia Chaisson: [name] Other Clinician: Referring Lamond Glantz: [name] Treating Jaxson Keener/Extender: [name], [name] Weeks in Treatment: 14 Wound Status Wound Number: 3 Primary Venous Leg Ulcer Etiology: Wound Location: Left Lower Leg - Lateral Wound Open Wounding  Event: Not Known Status: Date Acquired: 01/16/2018 Comorbid Chronic Obstructive Pulmonary Disease (COPD), Weeks Of Treatment: 6 History: Sleep Apnea, Arrhythmia, Congestive Heart Clustered Wound: No Failure, Hypertension, Myocardial Infarction, Type II Diabetes, Osteoarthritis, Neuropathy, Received Radiation Photos Photo Uploaded By: [name] on 03/17/2018 16:49:46 Wound Measurements Length: (cm) 0.8 % Reduct Width: (cm) 0.4 % Reduct Depth: (cm) 0.1 Tunnelin Area: (cm) 0.251 Undermi Volume: (cm) 0.025 ion in Area: 93% ion in Volume: 93% g: No ning: No Wound Description Full Thickness Without Exposed Support Foul Odo Classification: Structures Slough/F Wound Margin: Flat and Intact Exudate Small Amount: Exudate Type: Serous Exudate Color: amber r After Cleansing: No ibrino Yes Wound Bed Granulation Amount: Large (67-100%) Exposed Structure Granulation Quality: Pink Fascia Exposed: No Necrotic Amount: None Present (0%) Fat Layer (Subcutaneous Tissue) Exposed: Yes Tendon Exposed: No Muscle Exposed: No [name], Shawn T. ([id number]) Joint Exposed: No Bone Exposed: No Periwound Skin Texture Texture Color No Abnormalities Noted: Yes No Abnormalities Noted: No Atrophie Blanche: No Moisture Cyanosis: No No Abnormalities Noted: No Ecchymosis: No Dry / Scaly: No Erythema: Yes Maceration: No Erythema Location: Circumferential Hemosiderin Staining: No Mottled: No Pallor: No Rubor: No Wound Preparation Ulcer Cleansing: Rinsed/Irrigated with Saline Topical Anesthetic Applied: Other: lidocaine 4%, Treatment Notes Wound #3 (Left, Lateral Lower Leg) Notes Silver Cell, gauze, Conform secured with Tubigrip. Electronic Signature(s) Signed: 03/17/2018 3:45:44 PM By: [name] Entered By: [name] on 03/17/2018 10:43:18 [name], [name] ([id number]) -------------------------------------------------------------------------------- Vitals  Details Patient Name: Shawn Bark T. Date of Service: 03/17/2018 10:15 AM Medical Record Number: [id number] Patient Account Number: [id number] Date of Birth/Sex: [birth year] (84 y.o. M) Treating RN: [name] Primary Care Khadijah Mastrianni: [name] Other Clinician: Referring Savaya Hakes: [name] Treating Vinessa Macconnell/Extender: [name], [name] Weeks in Treatment: 14 Vital Signs Time Taken: 10:25  Temperature (F): 97.8 Height (in): 66 Pulse (bpm): 56 Weight (lbs): 182 Respiratory Rate (breaths/min): 16 Body Mass Index (BMI): 29.4 Blood Pressure (mmHg): 101/62 Reference Range: 80 - 120 mg / dl Electronic Signature(s) Signed: 03/17/2018 5:02:10 PM By: [name] Entered By: [name] on 03/17/2018 10:31:05

## 2018-03-21 NOTE — Progress Notes (Signed)
[name], [name] ([id number]) Visit Report for 03/17/2018 Chief Complaint Document Details Patient Name: [name], [name]. Date of Service: 03/17/2018 10:15 AM Medical Record Number: [id number] Patient Account Number: [id number] Date of Birth/Sex: [birth year] (84 y.o. M) Treating RN: [name] Primary Care Provider: Webb [name] Other Clinician: Referring Provider: Webb [name] Treating Provider/Extender: [name] Weeks in Treatment: 14 Information Obtained from: Patient Chief Complaint Left lateral LE ulcer Electronic Signature(s) Signed: 03/20/2018 12:50:09 AM By: Shawn Keeler PA-C Entered By: [name] on 03/17/2018 10:21:58 [name] ([id number]) -------------------------------------------------------------------------------- HPI Details Patient Name: Shawn Bark [name]. Date of Service: 03/17/2018 10:15 AM Medical Record Number: [id number] Patient Account Number: [id number] Date of Birth/Sex: Aug 24, 1931 (84 y.o. M) Treating RN: [name] Primary Care Provider: Webb [name] Other Clinician: Referring Provider: Webb [name] Treating Provider/Extender: [name] Weeks in Treatment: 14 History of Present Illness Associated Signs and Symptoms: Patient has a history of lymphedema, type II diabetes mellitus, long-term use of Eliquis, congestive heart failure, and COPD. HPI Description: 12/06/17 on evaluation today patient is here for evaluation of an ulcer on the left lateral lower extremity which has been present for roughly 2-3 months. Currently he has been using silver alginate and it was considered putting the patient in an [employer] wrap. However he did have arterial studies which actually showed to be fairly normal on the right however on the left he did have an ABI of 0.75. With that being said this ABI was consistent with at least mild-to-moderate arterial disease. The patient actually has some question on the test as to whether or not this  result was actually even accurate his arterial disease could be more significant. For that reason I think he may need to see a vascular specialist to see about possibly undergoing an angiogram to see if arterial flow could be improved. With that being said it does appear that the patient rather desperately needs compression therapy he has significant edema of the bilateral lower extremities noted during evaluation today. No fevers, chills, nausea, or vomiting noted at this time. 12/16/17 on evaluation today patient actually appears to be doing better in regard to his lower extremity ulcers of the left lower extremity. Both wounds are significantly smaller and improved compared to the initial evaluation last week. Overall I'm very pleased with the progress at this time. The patient likewise is pleased with how things appear. He states that he has thought about it and really does not want to have an angiogram that would prefer not to go see the vascular specialist which we had recommended last week. He states when he had this previous on top of the fact that he had issues with getting the groin area to stop bleeding he states that the die also seemed to cause issues in fact he states through him into having another heart attack. Obviously he has good reasons to want to try to avoid this which I completely understand. 01/02/18 on evaluation today patient actually appears to be showing signs of good improvement which is excellent news. He has been tolerating the dressing changes without complication. Unfortunately he was in the hospital due to an infection of his left forearm he was on IV antibiotics for six days. The good news is he seems to be doing much better in that regard his left lower extremity also is doing better at this point. There does not appear to be any evidence of infection at this time which is great news. Overall I'm  very pleased with the progress he seems to be making. 01/30/18 patient  has not been seen our office actually since 23 September. At that point following he apparently healed but then had several areas that we opened shortly following. He has new areas in three different spots on evaluation today. This is on the left lower extremity were the ones previously were. With that being said there does not appear to be any evidence of infection at this time which is good news. No fevers, chills, nausea, or vomiting noted at this time. 02/10/18 on evaluation today patient actually appears to be doing excellent in regard to his lower extremities. He just has one wound that appears to still be open at this time. There does not appear to be any evidence of infection currently. With that being said he has been tolerating the compression that we been utilizing without complication. No fevers, chills, nausea, or vomiting noted at this time. 02/24/18 on evaluation today patient's lower extremity ulcer actually shows signs of good improvement at this time. Fortunately there is no evidence of infection. Overall he has some pain but nothing too significant. 03/17/18 on evaluation today patient appears to be doing better in regard to the ulcer on his left lower extremity. This is roughly half the size it was when I saw him last at least. Maybe more. Nonetheless overall he seems to be doing excellent with the Current wound care measures. Electronic Signature(s) Signed: 03/20/2018 12:50:09 AM By: [name], Shawn TMarland Kitchen ([id number]) Entered By: [name] on 03/20/2018 00:47:46 [name], [name] ([id number]) -------------------------------------------------------------------------------- Physical Exam Details Patient Name: [name], [name]. Date of Service: 03/17/2018 10:15 AM Medical Record Number: [id number] Patient Account Number: [id number] Date of Birth/Sex: Mar 16, 1932 (84 y.o. M) Treating RN: [name] Primary Care Provider: Webb [name] Other  Clinician: Referring Provider: Webb [name] Treating Provider/Extender: [name] Weeks in Treatment: 8 Constitutional Well-nourished and well-hydrated in no acute distress. Respiratory normal breathing without difficulty. clear to auscultation bilaterally. Cardiovascular regular rate and rhythm with normal S1, S2. Psychiatric this patient is able to make decisions and demonstrates good insight into disease process. Alert and Oriented x 3. pleasant and cooperative. Notes Patient's wound bed currently shows evidence of good granulation. There does not appear to be any signs of infection which is good news overall he is tolerating the dressing changes. Electronic Signature(s) Signed: 03/20/2018 12:50:09 AM By: Shawn Keeler PA-C Entered By: [name] on 03/20/2018 00:48:22 [name] ([id number]) -------------------------------------------------------------------------------- Physician Orders Details Patient Name: Shawn Bark [name]. Date of Service: 03/17/2018 10:15 AM Medical Record Number: [id number] Patient Account Number: [id number] Date of Birth/Sex: 02-Apr-1932 (84 y.o. 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 Anesthetic (add to Medication List) Wound #3 Left,Lateral Lower  Leg o Topical Lidocaine 4% cream applied to wound bed prior to debridement (In Clinic Only). Primary Wound Dressing Wound #3 Left,Lateral Lower Leg o Silver Alginate Secondary Dressing  Wound #3 Left,Lateral Lower Leg o Gauze and Kerlix/Conform Dressing Change Frequency Wound #3 Left,Lateral Lower Leg o Change Dressing Monday, Wednesday, Friday - Patient is going to return to clinic in 3 weeks so on weeks when patient is not seen in clinic, [facility] to visit patient 3 times on Monday, Wednesday and Friday; other weeks Monday and Wednesday Follow-up Appointments Wound #3 Left,Lateral Lower Leg o Return Appointment in 2 weeks. - Patient is going to return to clinic in 2 weeks so on weeks when patient is not seen in clinic, [facility] to visit patient 3 times on Monday, Wednesday and Friday; other weeks Monday and Wednesday Edema Control Wound #3 Left,Lateral Lower Leg o Patient to wear own compression stockings - Tubigrip in clinic [name], [name] ([id number]) [employer] [facility] [place] [facility] - [facility]- Patient is going to return to clinic in 3 weeks so on weeks when patient is not seen in clinic, [facility] to visit patient 3 times on Monday, Wednesday and Friday; other weeks Monday and Wednesday o Home Health Nurse may visit PRN to address patientos wound care needs. o FACE TO FACE ENCOUNTER: MEDICARE and MEDICAID PATIENTS: I certify that this patient is under my care and that I had a face-to-face encounter that meets the physician face-to-face encounter requirements with this patient on this date. The encounter with the patient was in whole or in part for the following MEDICAL CONDITION: (primary reason for [place]) MEDICAL NECESSITY: I certify, that based on my findings, NURSING services are a medically necessary home health service. HOME BOUND STATUS: I certify that my clinical findings support that this patient is homebound  (i.e., Due to illness or injury, pt requires aid of supportive devices such as crutches, cane, wheelchairs, walkers, the use of special transportation or the assistance of another person to leave their place of residence. There is a normal inability to leave the home and doing so requires considerable and taxing effort. Other absences are for medical reasons / religious services and are infrequent or of short duration when for other reasons). o If current dressing causes regression in wound condition, may D/C ordered dressing product/s and apply Normal Saline Moist Dressing daily until next [place] / Other MD appointment. [place] of regression in wound condition at [phone]. o Please direct any NON-WOUND related issues/requests for orders to patient's Primary Care Physician Electronic Signature(s) Signed: 03/17/2018 3:45:44 PM By: [name] Signed: 03/20/2018 12:50:09 AM By: Shawn Keeler PA-C Entered By: [name] on 03/17/2018 10:45:19 [name], [name] ([id number]) -------------------------------------------------------------------------------- Problem List Details Patient Name: LUKAS, PELCHER [name]. Date of Service: 03/17/2018 10:15 AM Medical Record Number: [id number] Patient Account Number: [id number] Date of Birth/Sex: 1932-03-09 (84 y.o. M) Treating RN: [name] Primary Care Provider: Webb [name] Other Clinician: Referring Provider: Webb [name] Treating Provider/Extender: [name] Weeks in Treatment: 14 Active Problems ICD-10 Evaluated Encounter Code Description Active Date Today Diagnosis I89.0 Lymphedema, not elsewhere classified 12/06/2017 No Yes L97.822 Non-pressure chronic ulcer of other part of left lower leg with 12/06/2017 No Yes fat layer exposed E11.622 Type 2 diabetes mellitus with other skin ulcer 12/06/2017 No Yes Z79.01 Long term (current) use of anticoagulants 12/06/2017 No Yes I50.42 Chronic combined  systolic (congestive) and diastolic 10/22/4578 No Yes (congestive) heart failure J44.9 Chronic obstructive pulmonary disease, unspecified 12/06/2017 No Yes Inactive Problems Resolved Problems Electronic Signature(s) Signed: 03/20/2018 12:50:09 AM By: Shawn Keeler PA-C Entered By: [name]  on 03/17/2018 10:21:47 OLON, RUSS ([id number]) -------------------------------------------------------------------------------- Progress Note Details Patient Name: KEELAN, TRIPODI [name]. Date of Service: 03/17/2018 10:15 AM Medical Record Number: [id number] Patient Account Number: [id number] Date of Birth/Sex: February 28, 1932 (84 y.o. M) Treating RN: [name] Primary Care Provider: Webb [name] Other Clinician: Referring Provider: Webb [name] Treating Provider/Extender: [name] Weeks in Treatment: 14 Subjective Chief Complaint Information obtained from Patient Left lateral LE ulcer History of Present Illness (HPI) The following HPI elements were documented for the patient's wound: Associated Signs and Symptoms: Patient has a history of lymphedema, type II diabetes mellitus, long-term use of Eliquis, congestive heart failure, and COPD. 12/06/17 on evaluation today patient is here for evaluation of an ulcer on the left lateral lower extremity which has been present for roughly 2-3 months. Currently he has been using silver alginate and it was considered putting the patient in an [employer] wrap. However he did have arterial studies which actually showed to be fairly normal on the right however on the left he did have an ABI of 0.75. With that being said this ABI was consistent with at least mild-to-moderate arterial disease. The patient actually has some question on the test as to whether or not this result was actually even accurate his arterial disease could be more significant. For that reason I think he may need to see a vascular specialist to see about possibly undergoing  an angiogram to see if arterial flow could be improved. With that being said it does appear that the patient rather desperately needs compression therapy he has significant edema of the bilateral lower extremities noted during evaluation today. No fevers, chills, nausea, or vomiting noted at this time. 12/16/17 on evaluation today patient actually appears to be doing better in regard to his lower extremity ulcers of the left lower extremity. Both wounds are significantly smaller and improved compared to the initial evaluation last week. Overall I'm very pleased with the progress at this time. The patient likewise is pleased with how things appear. He states that he has thought about it and really does not want to have an angiogram that would prefer not to go see the vascular specialist which we had recommended last week. He states when he had this previous on top of the fact that he had issues with getting the groin area to stop bleeding he states that the die also seemed to cause issues in fact he states through him into having another heart attack. Obviously he has good reasons to want to try to avoid this which I completely understand. 01/02/18 on evaluation today patient actually appears to be showing signs of good improvement which is excellent news. He has been tolerating the dressing changes without complication. Unfortunately he was in the hospital due to an infection of his left forearm he was on IV antibiotics for six days. The good news is he seems to be doing much better in that regard his left lower extremity also is doing better at this point. There does not appear to be any evidence of infection at this time which is great news. Overall I'm very pleased with the progress he seems to be making. 01/30/18 patient has not been seen our office actually since 23 September. At that point following he apparently healed but then had several areas that we opened shortly following. He has new areas  in three different spots on evaluation today. This is on the left lower extremity were the ones previously were. With that being said  there does not appear to be any evidence of infection at this time which is good news. No fevers, chills, nausea, or vomiting noted at this time. 02/10/18 on evaluation today patient actually appears to be doing excellent in regard to his lower extremities. He just has one wound that appears to still be open at this time. There does not appear to be any evidence of infection currently. With that being said he has been tolerating the compression that we been utilizing without complication. No fevers, chills, nausea, or vomiting noted at this time. 02/24/18 on evaluation today patient's lower extremity ulcer actually shows signs of good improvement at this time. Fortunately there is no evidence of infection. Overall he has some pain but nothing too significant. [name], [name] ([id number]) 03/17/18 on evaluation today patient appears to be doing better in regard to the ulcer on his left lower extremity. This is roughly half the size it was when I saw him last at least. Maybe more. Nonetheless overall he seems to be doing excellent with the Current wound care measures. Patient History Information obtained from Patient. Family History Diabetes - Maternal Grandparents, Heart Disease - Paternal Grandparents, Hypertension - Paternal Grandparents, Lung Disease - Mother, No family history of Cancer, Hereditary Spherocytosis, Kidney Disease, Seizures, Stroke, Thyroid Problems, Tuberculosis. Social History Former smoker - quit in 1993, Marital Status - Married, Alcohol Use - Never, Drug Use - No History, Caffeine Use - Daily. Medical And Surgical History Notes Neurologic CVA in 1999 Review of Systems (ROS) Constitutional Symptoms (General Health) Denies complaints or symptoms of Fever, Chills. Respiratory The patient has no complaints or  symptoms. Cardiovascular Complains or has symptoms of LE edema. Psychiatric The patient has no complaints or symptoms. Objective Constitutional Well-nourished and well-hydrated in no acute distress. Vitals Time Taken: 10:25 AM, Height: 66 in, Weight: 182 lbs, BMI: 29.4, Temperature: 97.8 F, Pulse: 56 bpm, Respiratory Rate: 16 breaths/min, Blood Pressure: 101/62 mmHg. Respiratory normal breathing without difficulty. clear to auscultation bilaterally. Cardiovascular regular rate and rhythm with normal S1, S2. Psychiatric this patient is able to make decisions and demonstrates good insight into disease process. Alert and Oriented x 3. pleasant and cooperative. General Notes: Patient's wound bed currently shows evidence of good granulation. There does not appear to be any signs of [name], [name]. ([id number]) infection which is good news overall he is tolerating the dressing changes. Integumentary (Hair, Skin) Wound #3 status is Open. Original cause of wound was Not Known. The wound is located on the Left,Lateral Lower Leg. The wound measures 0.8cm length x 0.4cm width x 0.1cm depth; 0.251cm^2 area and 0.025cm^3 volume. There is Fat Layer (Subcutaneous Tissue) Exposed exposed. There is no tunneling or undermining noted. There is a small amount of serous drainage noted. The wound margin is flat and intact. There is large (67-100%) pink granulation within the wound bed. There is no necrotic tissue within the wound bed. The periwound skin appearance had no abnormalities noted for texture. The periwound skin appearance exhibited: Erythema. The periwound skin appearance did not exhibit: Dry/Scaly, Maceration, Atrophie Blanche, Cyanosis, Ecchymosis, Hemosiderin Staining, Mottled, Pallor, Rubor. The surrounding wound skin color is noted with erythema which is circumferential. Assessment Active Problems ICD-10 Lymphedema, not elsewhere classified Non-pressure chronic ulcer of other part of  left lower leg with fat layer exposed Type 2 diabetes mellitus with other skin ulcer Long term (current) use of anticoagulants Chronic combined systolic (congestive) and diastolic (congestive) heart failure Chronic obstructive pulmonary disease, unspecified Plan Wound Cleansing: Wound #  3 Left,Lateral Lower Leg: Clean wound with Normal Saline. Anesthetic (add to Medication List): Wound #3 Left,Lateral Lower Leg: Topical Lidocaine 4% cream applied to wound bed prior to debridement (In Clinic Only). Primary Wound Dressing: Wound #3 Left,Lateral Lower Leg: Silver Alginate Secondary Dressing: Wound #3 Left,Lateral Lower Leg: Gauze and Kerlix/Conform Dressing Change Frequency: Wound #3 Left,Lateral Lower Leg: Change Dressing Monday, Wednesday, Friday - Patient is going to return to clinic in 3 weeks so on weeks when patient is not seen in clinic, [facility] to visit patient 3 times on Monday, Wednesday and Friday; other weeks Monday and Wednesday Follow-up Appointments: Wound #3 Left,Lateral Lower Leg: Return Appointment in 2 weeks. - Patient is going to return to clinic in 2 weeks so on weeks when patient is not seen in clinic, [facility] to visit patient 3 times on Monday, Wednesday and Friday; other weeks Monday and Wednesday Edema Control: Wound #3 Left,Lateral Lower Leg: Patient to wear own compression stockings - Tubigrip in clinic Home Health: [name], [name] ([id number]) Wound #3 Left,Lateral Lower Leg: [place] [facility] - [facility]- Patient is going to return to clinic in 3 weeks so on weeks when patient is not seen in clinic, [facility] to visit patient 3 times on Monday, Wednesday and Friday; other weeks Monday and Wednesday Home Health Nurse may visit PRN to address patient s wound care needs. FACE TO FACE ENCOUNTER: MEDICARE and MEDICAID PATIENTS: I certify that this patient is under my care and that I had a face-to-face encounter that meets the physician face-to-face encounter  requirements with this patient on this date. The encounter with the patient was in whole or in part for the following MEDICAL CONDITION: (primary reason for [place]) MEDICAL NECESSITY: I certify, that based on my findings, NURSING services are a medically necessary home health service. HOME BOUND STATUS: I certify that my clinical findings support that this patient is homebound (i.e., Due to illness or injury, pt requires aid of supportive devices such as crutches, cane, wheelchairs, walkers, the use of special transportation or the assistance of another person to leave their place of residence. There is a normal inability to leave the home and doing so requires considerable and taxing effort. Other absences are for medical reasons / religious services and are infrequent or of short duration when for other reasons). If current dressing causes regression in wound condition, may D/C ordered dressing product/s and apply Normal Saline Moist Dressing daily until next [place] / Other MD appointment. [place] of regression in wound condition at [phone]. Please direct any NON-WOUND related issues/requests for orders to patient's Primary Care Physician I'm gonna recommend that we continue with the above wound care measures for the next week. The patient is in agreement with the plan. Subsequently we will see were things stand at follow-up. Please see above for specific wound care orders. We will see patient for re-evaluation in 1 week(s) here in the clinic. If anything worsens or changes patient will contact our office for additional recommendations. Electronic Signature(s) Signed: 03/20/2018 12:50:09 AM By: Shawn Keeler PA-C Entered By: [name] on 03/20/2018 00:49:02 [name] ([id number]) -------------------------------------------------------------------------------- ROS/PFSH Details Patient Name: Shawn Bark [name]. Date of Service:  03/17/2018 10:15 AM Medical Record Number: [id number] Patient Account Number: [id number] Date of Birth/Sex: 1931-06-13 (84 y.o. M) Treating RN: [name] Primary Care Provider: Webb [name] Other Clinician: Referring Provider: Webb [name] Treating Provider/Extender: [name] Weeks in Treatment: 14 Information Obtained From Patient  Wound History Do you currently have one or more open woundso Yes How many open wounds do you currently haveo 1 Approximately how long have you had your woundso 2 months How have you been treating your wound(s) until nowo silver alginate Has your wound(s) ever healed and then re-openedo No Have you had any lab work done in the past montho Yes Who ordered the lab work doneo PCP Have you tested positive for an antibiotic resistant organism (MRSA, VRE)o No Have you tested positive for osteomyelitis (bone infection)o No Have you had any tests for circulation on your legso No Have you had other problems associated with your woundso Infection, Swelling Constitutional Symptoms (General Health) Complaints and Symptoms: Negative for: Fever; Chills Cardiovascular Complaints and Symptoms: Positive for: LE edema Medical History: Positive for: Arrhythmia - a fib; Congestive Heart Failure; Hypertension; Myocardial Infarction - 1997 Negative for: Angina; Coronary Artery Disease; Deep Vein Thrombosis; Hypotension; Peripheral Arterial Disease; Peripheral Venous Disease; Phlebitis; Vasculitis Eyes Medical History: Negative for: Cataracts; Glaucoma; Optic Neuritis Ear/Nose/Mouth/Throat Medical History: Negative for: Chronic sinus problems/congestion; Middle ear problems Hematologic/Lymphatic Medical History: Negative for: Anemia; Hemophilia; Human Immunodeficiency Virus; Lymphedema; Sickle Cell Disease Respiratory Complaints and Symptoms: No Complaints or Symptoms [name], KERKHOFF [name]. ([id number]) Medical History: Positive for: Chronic Obstructive  Pulmonary Disease (COPD); Sleep Apnea - CPAP Negative for: Aspiration; Asthma; Pneumothorax; Tuberculosis Gastrointestinal Medical History: Negative for: Cirrhosis ; Colitis; Crohnos; Hepatitis A; Hepatitis B; Hepatitis C Endocrine Medical History: Positive for: Type II Diabetes Negative for: Type I Diabetes Treated with: Insulin, Oral agents Blood sugar tested every day: Yes Tested : QD Genitourinary Medical History: Negative for: End Stage Renal Disease Immunological Medical History: Negative for: Lupus Erythematosus; Raynaudos; Scleroderma Integumentary (Skin) Medical History: Negative for: History of Burn; History of pressure wounds Musculoskeletal Medical History: Positive for: Osteoarthritis Negative for: Gout; Rheumatoid Arthritis; Osteomyelitis Neurologic Medical History: Positive for: Neuropathy Negative for: Dementia; Quadriplegia; Paraplegia; Seizure Disorder Past Medical History Notes: CVA in [place] History: Positive for: Received Radiation - 5 treatments in chest Negative for: Received Chemotherapy Psychiatric Complaints and Symptoms: No Complaints or Symptoms Medical History: DYRON, KAWANO ([id number]) Negative for: Anorexia/bulimia; Confinement Anxiety Immunizations Pneumococcal Vaccine: Received Pneumococcal Vaccination: Yes Implantable Devices Family and Social History Cancer: No; Diabetes: Yes - Maternal Grandparents; Heart Disease: Yes - Paternal Grandparents; Hereditary Spherocytosis: No; Hypertension: Yes - Paternal Grandparents; Kidney Disease: No; Lung Disease: Yes - Mother; Seizures: No; Stroke: No; Thyroid Problems: No; Tuberculosis: No; Former smoker - quit in 1993; Marital Status - Married; Alcohol Use: Never; Drug Use: No History; Caffeine Use: Daily; Financial Concerns: No; Food, Clothing or Shelter Needs: No; Support System Lacking: No; Transportation Concerns: No; Advanced Directives: No; Patient does not want  information on Advanced Directives Physician Affirmation I have reviewed and agree with the above information. Electronic Signature(s) Signed: 03/20/2018 12:50:09 AM By: Shawn Keeler PA-C Signed: 03/20/2018 4:59:42 PM By: [name] Entered By: [name] on 03/20/2018 00:48:03 Daman, [name] ([id number]) -------------------------------------------------------------------------------- SuperBill Details Patient Name: Shawn Bark [name]. Date of Service: 03/17/2018 Medical Record Number: [id number] Patient Account Number: [id number] Date of Birth/Sex: 1932/01/13 (84 y.o. M) Treating RN: [name] Primary Care Provider: Webb [name] Other Clinician: Referring Provider: Webb [name] Treating Provider/Extender: [name] Weeks in Treatment: 14 Diagnosis Coding ICD-10 Codes Code Description I89.0 Lymphedema, not elsewhere classified L97.822 Non-pressure chronic ulcer of other part of left lower leg with fat layer exposed E11.622 Type 2 diabetes mellitus with other skin ulcer Z79.01 Long  term (current) use of anticoagulants I50.42 Chronic combined systolic (congestive) and diastolic (congestive) heart failure J44.9 Chronic obstructive pulmonary disease, unspecified Facility Procedures CPT4 Code: [id number] Description: 99213 - WOUND CARE VISIT-LEV 3 EST PT Modifier: Quantity: 1 Physician Procedures CPT4 Code Description: [id number] 99214 - WC PHYS LEVEL 4 - EST PT ICD-10 Diagnosis Description I89.0 Lymphedema, not elsewhere classified L97.822 Non-pressure chronic ulcer of other part of left lower leg wit E11.622 Type 2 diabetes mellitus with other  skin ulcer Z79.01 Long term (current) use of anticoagulants Modifier: h fat layer expos Quantity: 1 ed Electronic Signature(s) Signed: 03/20/2018 12:50:09 AM By: Shawn Keeler PA-C Entered By: [name] on 03/19/2018 10:07:49

## 2018-03-22 ENCOUNTER — Ambulatory Visit
Admission: RE | Admit: 2018-03-22 | Discharge: 2018-03-22 | Disposition: A | Discharge status: Home / Self Care | Payer: Medicare Other | Source: Ambulatory Visit | Attending: Radiation Oncology | Admitting: Radiation Oncology

## 2018-03-22 DIAGNOSIS — C3432 Malignant neoplasm of lower lobe, left bronchus or lung: Secondary | ICD-10-CM | POA: Diagnosis present

## 2018-03-23 ENCOUNTER — Encounter: Payer: Self-pay | Admitting: Internal Medicine

## 2018-03-23 MED ORDER — TRAMADOL HCL 50 MG PO TABS: 50.0000 mg | ORAL_TABLET | Freq: Two times a day (BID) | ORAL | 0 refills | Status: DC

## 2018-03-23 MED ORDER — TAMSULOSIN HCL 0.4 MG PO CAPS: 0.4000 mg | ORAL_CAPSULE | Freq: Every day | ORAL | 0 refills | Status: DC

## 2018-03-23 NOTE — Telephone Encounter (Signed)
Last filled 01/16/18 #60, but pt is requesting 90 day supply to be sent to mail order...Marland Kitchen please advise

## 2018-03-27 ENCOUNTER — Other Ambulatory Visit: Payer: Self-pay | Admitting: Cardiovascular Disease

## 2018-03-27 MED ORDER — APIXABAN 2.5 MG PO TABS: 2.5000 mg | ORAL_TABLET | Freq: Two times a day (BID) | ORAL | 1 refills | Status: DC

## 2018-03-27 NOTE — Telephone Encounter (Signed)
Please review for refill, Thanks !  

## 2018-03-27 NOTE — Telephone Encounter (Signed)
Pt's wt 86.9 kg, age 84, serum creatinine 1.78, CrCl 37.97. Eliquis 2.5 mg BID, #180 w/ 1 refill sent to [facility].

## 2018-03-27 NOTE — Telephone Encounter (Signed)
°*  STAT* If patient is at the pharmacy, call can be transferred to refill team.   1. Which medications need to be refilled? (please list name of each medication and dose if known) Eliquis 2.5 MG 1 tablet 2 times daily  2. Which pharmacy/location (including street and city if local pharmacy) is medication to be sent to? [facility]   3. Do they need a 30 day or 90 day supply? 90 day

## 2018-03-28 ENCOUNTER — Other Ambulatory Visit: Payer: Self-pay | Admitting: Internal Medicine

## 2018-03-29 ENCOUNTER — Other Ambulatory Visit: Payer: Self-pay

## 2018-03-29 ENCOUNTER — Encounter: Payer: Self-pay | Admitting: Radiation Oncology

## 2018-03-29 ENCOUNTER — Other Ambulatory Visit: Payer: Self-pay | Admitting: *Deleted

## 2018-03-29 ENCOUNTER — Ambulatory Visit
Admission: RE | Admit: 2018-03-29 | Discharge: 2018-03-29 | Disposition: A | Discharge status: Home / Self Care | Payer: Medicare Other | Source: Ambulatory Visit | Attending: Radiation Oncology | Admitting: Radiation Oncology

## 2018-03-29 VITALS — BP 104/68 | HR 83 | Temp 95.8°F | Resp 18 | Wt 185.3 lb

## 2018-03-29 DIAGNOSIS — Z923 Personal history of irradiation: Secondary | ICD-10-CM | POA: Diagnosis not present

## 2018-03-29 DIAGNOSIS — Z87891 Personal history of nicotine dependence: Secondary | ICD-10-CM | POA: Insufficient documentation

## 2018-03-29 DIAGNOSIS — C3432 Malignant neoplasm of lower lobe, left bronchus or lung: Secondary | ICD-10-CM

## 2018-03-29 DIAGNOSIS — Z85118 Personal history of other malignant neoplasm of bronchus and lung: Secondary | ICD-10-CM | POA: Insufficient documentation

## 2018-03-29 NOTE — Progress Notes (Signed)
Radiation Oncology Follow up Note  Name: [name].   Date:   03/29/2018 MRN:  [id number] DOB: 1931/10/08    This 84 y.o. male presents to the clinic today for ten-month follow-up status post SB RT to his left lower lobe stage I adenocarcinoma.  REFERRING PROVIDER: Jearld Fenton, NP  HPI: patient is in 84 year old male now seen out 10 months having completed SB RT to his left lower lobe for stage I adenocarcinoma. Seen today in routine follow-up he is doing fairly well. He specifically denies cough hemoptysis or chest tightness..recent CT scan showed continued further decreased in size of the left lower lobe pulmonary nodule no other evidence of disease.  COMPLICATIONS OF TREATMENT: none  FOLLOW UP COMPLIANCE: keeps appointments   PHYSICAL EXAM:  BP 104/68 (BP Location: Left Arm, Patient Position: Sitting)   Pulse 83   Temp (!) 95.8 F (35.4 C) (Tympanic)   Resp 18   Wt 185 lb 4.8 oz (84.1 kg)   BMI 29.91 kg/m  Well-developed well-nourished patient in NAD. HEENT reveals PERLA, EOMI, discs not visualized.  Oral cavity is clear. No oral mucosal lesions are identified. Neck is clear without evidence of cervical or supraclavicular adenopathy. Lungs are clear to A&P. Cardiac examination is essentially unremarkable with regular rate and rhythm without murmur rub or thrill. Abdomen is benign with no organomegaly or masses noted. Motor sensory and DTR levels are equal and symmetric in the upper and lower extremities. Cranial nerves II through XII are grossly intact. Proprioception is intact. No peripheral adenopathy or edema is identified. No motor or sensory levels are noted. Crude visual fields are within normal range.  RADIOLOGY RESULTS: CT scans reviewed and compatible with the above-stated findings  PLAN: the present time patient is doing well with no evidence of disease excellent response by CT criteria. I am please was overall progress. I've asked to see him back in 1 year for  follow-up CT scan of the chest with contrast prior to that visit. Patient knows to call sooner with any concerns.  I would like to take this opportunity to thank you for allowing me to participate in the care of your patient.Noreene Filbert, MD

## 2018-03-31 ENCOUNTER — Encounter: Payer: Medicare Other | Admitting: Physician Assistant

## 2018-03-31 DIAGNOSIS — E11622 Type 2 diabetes mellitus with other skin ulcer: Secondary | ICD-10-CM | POA: Diagnosis not present

## 2018-04-02 NOTE — Progress Notes (Addendum)
[name] ([id number]) Visit Report for 03/31/2018 Arrival Information Details Patient Name: [name], [name]. Date of Service: 03/31/2018 10:15 AM Medical Record Number: [id number] Patient Account Number: [id number] Date of Birth/Sex: 08-Oct-1931 (84 y.o. M) Treating RN: Montey Hora Primary Care Vian Fluegel: [name] Other Clinician: Referring Reola Buckles: [name] Treating Miabella Shannahan/Extender: Melburn Hake, HOYT Weeks in Treatment: 16 Visit Information History Since Last Visit Added or deleted any medications: Yes Patient Arrived: Walker Any new allergies or adverse reactions: No Arrival Time: 10:11 Had a fall or experienced change in No Accompanied By: self activities of daily living that may affect Transfer Assistance: None risk of falls: Patient Identification Verified: Yes Signs or symptoms of abuse/neglect since last visito No Secondary Verification Process Yes Hospitalized since last visit: No Completed: Implantable device outside of the clinic excluding No Patient Has Alerts: Yes cellular tissue based products placed in the center Patient Alerts: Patient on Blood since last visit: Thinner Has Dressing in Place as Prescribed: Yes Eliquis Pain Present Now: No DMII ABI [facility] BILATERAL Electronic Signature(s) Signed: 03/31/2018 11:54:57 AM By: Lorine Bears RCP, RRT, CHT Entered By: Becky Sax, Amado Nash on 03/31/2018 10:11:40 [name], [name] ([id number]) -------------------------------------------------------------------------------- Clinic Level of Care Assessment Details Patient Name: Shawn Bark T. Date of Service: 03/31/2018 10:15 AM Medical Record Number: [id number] Patient Account Number: [id number] Date of Birth/Sex: 01-24-1932 (84 y.o. M) Treating RN: Montey Hora Primary Care Beatriz Quintela: [name] Other Clinician: Referring Idrees Quam: [name] Treating Prentiss Hammett/Extender: Melburn Hake, HOYT Weeks in Treatment:  16 Clinic Level of Care Assessment Items TOOL 4 Quantity Score []  - Use when only an EandM is performed on FOLLOW-UP visit 0 ASSESSMENTS - Nursing Assessment / Reassessment X - Reassessment of Co-morbidities (includes updates in patient status) 1 10 X- 1 5 Reassessment of Adherence to Treatment Plan ASSESSMENTS - Wound and Skin Assessment / Reassessment X - Simple Wound Assessment / Reassessment - one wound 1 5 []  - 0 Complex Wound Assessment / Reassessment - multiple wounds []  - 0 Dermatologic / Skin Assessment (not related to wound area) ASSESSMENTS - Focused Assessment []  - Circumferential Edema Measurements - multi extremities 0 []  - 0 Nutritional Assessment / Counseling / Intervention X- 1 5 Lower Extremity Assessment (monofilament, tuning fork, pulses) []  - 0 Peripheral Arterial Disease Assessment (using hand held doppler) ASSESSMENTS - Ostomy and/or Continence Assessment and Care []  - Incontinence Assessment and Management 0 []  - 0 Ostomy Care Assessment and Management (repouching, etc.) PROCESS - Coordination of Care X - Simple Patient / Family Education for ongoing care 1 15 []  - 0 Complex (extensive) Patient / Family Education for ongoing care X- 1 10 Staff obtains Programmer, systems, Records, Test Results / Process Orders []  - 0 Staff telephones HHA, Nursing Homes / Clarify orders / etc []  - 0 Routine Transfer to another Facility (non-emergent condition) []  - 0 Routine Hospital Admission (non-emergent condition) []  - 0 New Admissions / Biomedical engineer / Ordering NPWT, Apligraf, etc. []  - 0 Emergency Hospital Admission (emergent condition) X- 1 10 Simple Discharge Coordination EUSTACIO, ELLEN T. ([id number]) []  - 0 Complex (extensive) Discharge Coordination PROCESS - Special Needs []  - Pediatric / Minor Patient Management 0 []  - 0 Isolation Patient Management []  - 0 Hearing / Language / Visual special needs []  - 0 Assessment of Community assistance  (transportation, D/C planning, etc.) []  - 0 Additional assistance / Altered mentation []  - 0 Support Surface(s) Assessment (bed, cushion, seat, etc.) INTERVENTIONS - Wound Cleansing / Measurement X - Simple Wound  Cleansing - one wound 1 5 []  - 0 Complex Wound Cleansing - multiple wounds X- 1 5 Wound Imaging (photographs - any number of wounds) []  - 0 Wound Tracing (instead of photographs) X- 1 5 Simple Wound Measurement - one wound []  - 0 Complex Wound Measurement - multiple wounds INTERVENTIONS - Wound Dressings []  - Small Wound Dressing one or multiple wounds 0 X- 1 15 Medium Wound Dressing one or multiple wounds []  - 0 Large Wound Dressing one or multiple wounds []  - 0 Application of Medications - topical []  - 0 Application of Medications - injection INTERVENTIONS - Miscellaneous []  - External ear exam 0 []  - 0 Specimen Collection (cultures, biopsies, blood, body fluids, etc.) []  - 0 Specimen(s) / Culture(s) sent or taken to Lab for analysis []  - 0 Patient Transfer (multiple staff / Civil Service fast streamer / Similar devices) []  - 0 Simple Staple / Suture removal (25 or less) []  - 0 Complex Staple / Suture removal (26 or more) []  - 0 Hypo / Hyperglycemic Management (close monitor of Blood Glucose) []  - 0 Ankle / Brachial Index (ABI) - do not check if billed separately X- 1 5 Vital Signs [name], Shawn T. ([id number]) Has the patient been seen at the hospital within the last three years: Yes Total Score: 95 Level Of Care: New/Established - Level 3 Electronic Signature(s) Signed: 03/31/2018 5:12:41 PM By: Montey Hora Entered By: Montey Hora on 03/31/2018 10:41:23 [name], [name] ([id number]) -------------------------------------------------------------------------------- Complex / Palliative Patient Assessment Details Patient Name: Shawn Bark T. Date of Service: 03/31/2018 10:15 AM Medical Record Number: [id number] Patient Account Number: [id number] Date of  Birth/Sex: 1931-05-13 (84 y.o. M) Treating RN: Montey Hora Primary Care Taunja Brickner: [name] Other Clinician: Referring Tysheka Fanguy: [name] Treating [name]/Extender: Melburn Hake, HOYT Weeks in Treatment: 16 Palliative Management Criteria Complex Wound Management Criteria Patient requires a surgical procedure in order to achieve wound healing: patient has refused further vascular testing However, the patient does not wish to undergo the recommended surgical procedure. The patient, the patient's family or care Marquitta Persichetti(s), or primary care physician has requested supportive care rather than advanced wound treatment. (Must be documented in physician progress notes.) Care Approach Wound Care Plan: Complex Wound Management Electronic Signature(s) Signed: 03/31/2018 10:47:34 AM By: Montey Hora Signed: 04/01/2018 12:35:32 AM By: Worthy Keeler PA-C Entered By: Montey Hora on 03/31/2018 10:47:34 [name], [name] ([id number]) -------------------------------------------------------------------------------- Encounter Discharge Information Details Patient Name: [name], Shawn T. Date of Service: 03/31/2018 10:15 AM Medical Record Number: [id number] Patient Account Number: [id number] Date of Birth/Sex: 02/18/1932 (84 y.o. M) Treating RN: Montey Hora Primary Care Calyn Rubi: [name] Other Clinician: Referring Linkon Siverson: [name] Treating Emmalise Huard/Extender: Melburn Hake, HOYT Weeks in Treatment: 16 Encounter Discharge Information Items Discharge Condition: Stable Ambulatory Status: Walker Discharge Destination: Home Transportation: Private Auto Accompanied By: self Schedule Follow-up Appointment: Yes Clinical Summary of Care: Electronic Signature(s) Signed: 03/31/2018 5:12:41 PM By: Montey Hora Entered By: Montey Hora on 03/31/2018 10:44:25 [name], [name] ([id number]) -------------------------------------------------------------------------------- Lower  Extremity Assessment Details Patient Name: Shawn Bark T. Date of Service: 03/31/2018 10:15 AM Medical Record Number: [id number] Patient Account Number: [id number] Date of Birth/Sex: 12/16/1931 (84 y.o. M) Treating RN: Secundino Ginger Primary Care Zackariah Vanderpol: [name] Other Clinician: Referring Randell Teare: [name] Treating Josejulian Tarango/Extender: STONE III, HOYT Weeks in Treatment: 16 Edema Assessment Assessed: [Left: No] [Right: No] [Left: Edema] [Right: :] Calf Left: Right: Point of Measurement: 30 cm From Medial Instep cm cm Ankle Left: Right: Point of Measurement: 10 cm From  Medial Instep cm cm Vascular Assessment Claudication: Claudication Assessment [Left:None] Pulses: Dorsalis Pedis Palpable: [Left:Yes] Posterior Tibial Extremity colors, hair growth, and conditions: Extremity Color: [Left:Red] Hair Growth on Extremity: [Left:No] Temperature of Extremity: [Left:Warm] Capillary Refill: [Left:< 3 seconds] Toe Nail Assessment Left: Right: Thick: Yes Discolored: Yes Deformed: Yes Improper Length and Hygiene: No Electronic Signature(s) Signed: 03/31/2018 4:48:15 PM By: Secundino Ginger Entered By: Secundino Ginger on 03/31/2018 10:25:53 [name], [name] ([id number]) -------------------------------------------------------------------------------- Multi Wound Chart Details Patient Name: Shawn Bark T. Date of Service: 03/31/2018 10:15 AM Medical Record Number: [id number] Patient Account Number: [id number] Date of Birth/Sex: 1932-02-27 (84 y.o. M) Treating RN: Montey Hora Primary Care Nahal Wanless: [name] Other Clinician: Referring Shailene Demonbreun: [name] Treating Shavonne Ambroise/Extender: STONE III, HOYT Weeks in Treatment: 16 Vital Signs Height(in): 66 Pulse(bpm): 65 Weight(lbs): 182 Blood Pressure(mmHg): 100/60 Body Mass Index(BMI): 29 Temperature(F): 97.7 Respiratory Rate 18 (breaths/min): Photos: [N/A:N/A] Wound Location: Left Lower Leg - Lateral N/A N/A Wounding  Event: Not Known N/A N/A Primary Etiology: Venous Leg Ulcer N/A N/A Comorbid History: Chronic Obstructive N/A N/A Pulmonary Disease (COPD), Sleep Apnea, Arrhythmia, Congestive Heart Failure, Hypertension, Myocardial Infarction, Type II Diabetes, Osteoarthritis, Neuropathy, Received Radiation Date Acquired: 01/16/2018 N/A N/A Weeks of Treatment: 8 N/A N/A Wound Status: Open N/A N/A Measurements L x W x D 0.1x0.1x0.1 N/A N/A (cm) Area (cm) : 0.008 N/A N/A Volume (cm) : 0.001 N/A N/A % Reduction in Area: 99.80% N/A N/A % Reduction in Volume: 99.70% N/A N/A Classification: Full Thickness Without N/A N/A Exposed Support Structures Exudate Amount: None Present N/A N/A Wound Margin: Flat and Intact N/A N/A Granulation Amount: None Present (0%) N/A N/A Necrotic Amount: Medium (34-66%) N/A N/A Necrotic Tissue: Eschar N/A N/A Exposed Structures: Fat Layer (Subcutaneous N/A N/A Tissue) Exposed: Yes Ashe, Savvas T. ([id number]) Fascia: No Tendon: No Muscle: No Joint: No Bone: No Periwound Skin Texture: Excoriation: No N/A N/A Induration: No Callus: No Crepitus: No Rash: No Scarring: No Periwound Skin Moisture: Maceration: No N/A N/A Dry/Scaly: No Periwound Skin Color: Erythema: Yes N/A N/A Atrophie Blanche: No Cyanosis: No Ecchymosis: No Hemosiderin Staining: No Mottled: No Pallor: No Rubor: No Erythema Location: Circumferential N/A N/A Tenderness on Palpation: No N/A N/A Wound Preparation: Ulcer Cleansing: N/A N/A Rinsed/Irrigated with Saline Topical Anesthetic Applied: Other: lidocaine 4% Treatment Notes Electronic Signature(s) Signed: 03/31/2018 5:12:41 PM By: Montey Hora Entered By: Montey Hora on 03/31/2018 10:36:02 [name] ([id number]) -------------------------------------------------------------------------------- [place] Details Patient Name: Shawn Bark T. Date of Service: 03/31/2018 10:15 AM Medical  Record Number: [id number] Patient Account Number: [id number] Date of Birth/Sex: [birth year] (84 y.o. M) Treating RN: Montey Hora Primary Care Nadie Fiumara: [name] Other Clinician: Referring Freda Jaquith: [name] Treating Cornell Bourbon/Extender: Worthy Keeler Weeks in Treatment: 16 Active Inactive Electronic Signature(s) Signed: 05/08/2018 6:01:23 PM By: Gretta Cool, BSN, RN, CWS, Kim RN, BSN Signed: 06/19/2018 10:45:26 AM By: Montey Hora Previous Signature: 03/31/2018 5:12:41 PM Version By: Montey Hora Entered By: Gretta Cool BSN, RN, CWS, Kim on 05/08/2018 18:01:22 [name], [name] ([id number]) -------------------------------------------------------------------------------- Pain Assessment Details Patient Name: [name], Shawn T. Date of Service: 03/31/2018 10:15 AM Medical Record Number: [id number] Patient Account Number: [id number] Date of Birth/Sex: 1931/07/26 (84 y.o. M) Treating RN: Montey Hora Primary Care Deshundra Waller: [name] Other Clinician: Referring Brienna Bass: [name] Treating Aunesty Tyson/Extender: Melburn Hake, HOYT Weeks in Treatment: 16 Active Problems Location of Pain Severity and Description of Pain Patient Has Paino No Site Locations Pain Management and Medication Current Pain Management: Electronic Signature(s) Signed: 03/31/2018 11:54:57 AM  By: Lorine Bears RCP, RRT, CHT Signed: 03/31/2018 5:12:41 PM By: Montey Hora Entered By: Lorine Bears on 03/31/2018 10:11:47 [name] ([id number]) -------------------------------------------------------------------------------- Patient/Caregiver Education Details Patient Name: Shawn Bark T. Date of Service: 03/31/2018 10:15 AM Medical Record Number: [id number] Patient Account Number: [id number] Date of Birth/Gender: 10-02-1931 (84 y.o. M) Treating RN: Montey Hora Primary Care Physician: [name] Other Clinician: Referring Physician: [name] Treating  Physician/Extender: Sharalyn Ink in Treatment: 16 Education Assessment Education Provided To: Patient Education Topics Provided Venous: Handouts: Other: leg elevation Methods: Explain/Verbal Responses: State content correctly Electronic Signature(s) Signed: 03/31/2018 5:12:41 PM By: Montey Hora Entered By: Montey Hora on 03/31/2018 10:41:40 [name], [name] ([id number]) -------------------------------------------------------------------------------- Wound Assessment Details Patient Name: Shawn Bark T. Date of Service: 03/31/2018 10:15 AM Medical Record Number: [id number] Patient Account Number: [id number] Date of Birth/Sex: 28-Jun-1931 (84 y.o. M) Treating RN: Secundino Ginger Primary Care Earlisha Sharples: [name] Other Clinician: Referring Anil Havard: [name] Treating Kanton Kamel/Extender: STONE III, HOYT Weeks in Treatment: 16 Wound Status Wound Number: 3 Primary Venous Leg Ulcer Etiology: Wound Location: Left Lower Leg - Lateral Wound Open Wounding Event: Not Known Status: Date Acquired: 01/16/2018 Comorbid Chronic Obstructive Pulmonary Disease (COPD), Weeks Of Treatment: 8 History: Sleep Apnea, Arrhythmia, Congestive Heart Clustered Wound: No Failure, Hypertension, Myocardial Infarction, Type II Diabetes, Osteoarthritis, Neuropathy, Received Radiation Photos Photo Uploaded By: Secundino Ginger on 03/31/2018 10:30:23 Wound Measurements Length: (cm) 0.1 % Reducti Width: (cm) 0.1 % Reducti Depth: (cm) 0.1 Tunneling Area: (cm) 0.008 Undermin Volume: (cm) 0.001 on in Area: 99.8% on in Volume: 99.7% : No ing: No Wound Description Full Thickness Without Exposed Support Foul Odor Classification: Structures Slough/Fi Wound Margin: Flat and Intact Exudate None Present Amount: After Cleansing: No brino No Wound Bed Granulation Amount: None Present (0%) Exposed Structure Necrotic Amount: Medium (34-66%) Fascia Exposed: No Necrotic Quality: Eschar Fat  Layer (Subcutaneous Tissue) Exposed: Yes Tendon Exposed: No Muscle Exposed: No Joint Exposed: No Bone Exposed: No [name], Shawn T. ([id number]) Periwound Skin Texture Texture Color No Abnormalities Noted: Yes No Abnormalities Noted: No Atrophie Blanche: No Moisture Cyanosis: No No Abnormalities Noted: No Ecchymosis: No Dry / Scaly: No Erythema: Yes Maceration: No Erythema Location: Circumferential Hemosiderin Staining: No Mottled: No Pallor: No Rubor: No Wound Preparation Ulcer Cleansing: Rinsed/Irrigated with Saline Topical Anesthetic Applied: Other: lidocaine 4%, Electronic Signature(s) Signed: 03/31/2018 4:48:15 PM By: Secundino Ginger Entered By: Secundino Ginger on 03/31/2018 10:25:16 [name], [name] ([id number]) -------------------------------------------------------------------------------- Vitals Details Patient Name: Shawn Bark T. Date of Service: 03/31/2018 10:15 AM Medical Record Number: [id number] Patient Account Number: [id number] Date of Birth/Sex: Mar 07, 1932 (84 y.o. M) Treating RN: Montey Hora Primary Care Kehinde Totzke: [name] Other Clinician: Referring Miri Jose: [name] Treating Hussien Greenblatt/Extender: STONE III, HOYT Weeks in Treatment: 16 Vital Signs Time Taken: 10:18 Temperature (F): 97.7 Height (in): 66 Pulse (bpm): 62 Weight (lbs): 182 Respiratory Rate (breaths/min): 18 Body Mass Index (BMI): 29.4 Blood Pressure (mmHg): 100/60 Reference Range: 80 - 120 mg / dl Electronic Signature(s) Signed: 03/31/2018 11:54:57 AM By: Lorine Bears RCP, RRT, CHT Entered By: Lorine Bears on 03/31/2018 10:20:19

## 2018-04-02 NOTE — Progress Notes (Signed)
[name] ([id number]) Visit Report for 03/31/2018 Chief Complaint Document Details Patient Name: [name], [name]. Date of Service: 03/31/2018 10:15 AM Medical Record Number: [id number] Patient Account Number: [id number] Date of Birth/Sex: 1931/11/30 (84 y.o. M) Treating RN: Montey Hora Primary Care Provider: Webb Silversmith Other Clinician: Referring Provider: Webb Silversmith Treating Provider/Extender: Melburn Hake, Dontavian Marchi Weeks in Treatment: 16 Information Obtained from: Patient Chief Complaint Left lateral LE ulcer Electronic Signature(s) Signed: 04/01/2018 12:35:32 AM By: Worthy Keeler PA-C Entered By: Worthy Keeler on 03/31/2018 10:30:36 [name], [name] ([id number]) -------------------------------------------------------------------------------- HPI Details Patient Name: Shawn Bark T. Date of Service: 03/31/2018 10:15 AM Medical Record Number: [id number] Patient Account Number: [id number] Date of Birth/Sex: [birth year] (84 y.o. M) Treating RN: Montey Hora Primary Care Provider: Webb Silversmith Other Clinician: Referring Provider: Webb Silversmith Treating Provider/Extender: Melburn Hake, Keymora Grillot Weeks in Treatment: 16 History of Present Illness Associated Signs and Symptoms: Patient has a history of lymphedema, type II diabetes mellitus, long-term use of Eliquis, congestive heart failure, and COPD. HPI Description: 12/06/17 on evaluation today patient is here for evaluation of an ulcer on the left lateral lower extremity which has been present for roughly 2-3 months. Currently he has been using silver alginate and it was considered putting the patient in an [employer] wrap. However he did have arterial studies which actually showed to be fairly normal on the right however on the left he did have an ABI of 0.75. With that being said this ABI was consistent with at least mild-to-moderate arterial disease. The patient actually has some question on the test as to whether or not  this result was actually even accurate his arterial disease could be more significant. For that reason I think he may need to see a vascular specialist to see about possibly undergoing an angiogram to see if arterial flow could be improved. With that being said it does appear that the patient rather desperately needs compression therapy he has significant edema of the bilateral lower extremities noted during evaluation today. No fevers, chills, nausea, or vomiting noted at this time. 12/16/17 on evaluation today patient actually appears to be doing better in regard to his lower extremity ulcers of the left lower extremity. Both wounds are significantly smaller and improved compared to the initial evaluation last week. Overall I'm very pleased with the progress at this time. The patient likewise is pleased with how things appear. He states that he has thought about it and really does not want to have an angiogram that would prefer not to go see the vascular specialist which we had recommended last week. He states when he had this previous on top of the fact that he had issues with getting the groin area to stop bleeding he states that the die also seemed to cause issues in fact he states through him into having another heart attack. Obviously he has good reasons to want to try to avoid this which I completely understand. 01/02/18 on evaluation today patient actually appears to be showing signs of good improvement which is excellent news. He has been tolerating the dressing changes without complication. Unfortunately he was in the hospital due to an infection of his left forearm he was on IV antibiotics for six days. The good news is he seems to be doing much better in that regard his left lower extremity also is doing better at this point. There does not appear to be any evidence of infection at this time which is great news. Overall I'm  very pleased with the progress he seems to be making. 01/30/18  patient has not been seen our office actually since 23 September. At that point following he apparently healed but then had several areas that we opened shortly following. He has new areas in three different spots on evaluation today. This is on the left lower extremity were the ones previously were. With that being said there does not appear to be any evidence of infection at this time which is good news. No fevers, chills, nausea, or vomiting noted at this time. 02/10/18 on evaluation today patient actually appears to be doing excellent in regard to his lower extremities. He just has one wound that appears to still be open at this time. There does not appear to be any evidence of infection currently. With that being said he has been tolerating the compression that we been utilizing without complication. No fevers, chills, nausea, or vomiting noted at this time. 02/24/18 on evaluation today patient's lower extremity ulcer actually shows signs of good improvement at this time. Fortunately there is no evidence of infection. Overall he has some pain but nothing too significant. 03/17/18 on evaluation today patient appears to be doing better in regard to the ulcer on his left lower extremity. This is roughly half the size it was when I saw him last at least. Maybe more. Nonetheless overall he seems to be doing excellent with the Current wound care measures. 03/31/18 on evaluation today patient actually appears to be doing very well in regard to his lower extremity ulcer. He does have two areas that are leaking on the anterior portion of his lower extremity but no true ulcerations. The ulcer on the lateral portion of his lower extremity actually is doing better although it still is a little bit open at this point. It has a good granulation surface. [name] ([id number]) Electronic Signature(s) Signed: 04/01/2018 12:35:32 AM By: Worthy Keeler PA-C Entered By: Worthy Keeler on 03/31/2018  10:43:30 [name] ([id number]) -------------------------------------------------------------------------------- Physical Exam Details Patient Name: [name], Shawn T. Date of Service: 03/31/2018 10:15 AM Medical Record Number: [id number] Patient Account Number: [id number] Date of Birth/Sex: December 23, 1931 (84 y.o. M) Treating RN: Montey Hora Primary Care Provider: Webb Silversmith Other Clinician: Referring Provider: Webb Silversmith Treating Provider/Extender: STONE III, Yuriy Cui Weeks in Treatment: 42 Constitutional Well-nourished and well-hydrated in no acute distress. Respiratory normal breathing without difficulty. clear to auscultation bilaterally. Cardiovascular regular rate and rhythm with normal S1, S2. Psychiatric this patient is able to make decisions and demonstrates good insight into disease process. Alert and Oriented x 3. pleasant and cooperative. Notes No sharp debridement was required today. Patient tolerated the cleansing of the area without any pain or complication. Again he has declined in the past more advanced intervention with vascular he does not want any further procedures. For that reason we've not been able to do any stronger compression at this point. Again this was patient request. Electronic Signature(s) Signed: 04/01/2018 12:35:32 AM By: Worthy Keeler PA-C Entered By: Worthy Keeler on 03/31/2018 10:44:14 Breiner, [name] ([id number]) -------------------------------------------------------------------------------- Physician Orders Details Patient Name: Shawn Bark T. Date of Service: 03/31/2018 10:15 AM Medical Record Number: [id number] Patient Account Number: [id number] Date of Birth/Sex: 07-03-1931 (84 y.o. M) Treating RN: Montey Hora Primary Care Provider: Webb Silversmith Other Clinician: Referring Provider: Webb Silversmith Treating Provider/Extender: Melburn Hake, Keiffer Piper Weeks in Treatment: 16 Verbal / Phone Orders: No Diagnosis Coding ICD-10  Coding Code Description I89.0 Lymphedema, not elsewhere classified D63.875  Non-pressure chronic ulcer of other part of left lower leg with fat layer exposed E11.622 Type 2 diabetes mellitus with other skin ulcer Z79.01 Long term (current) use of anticoagulants I50.42 Chronic combined systolic (congestive) and diastolic (congestive) heart failure J44.9 Chronic obstructive pulmonary disease, unspecified Wound Cleansing Wound #3 Left,Lateral Lower Leg o Clean wound with Normal Saline. Anesthetic (add to Medication List) Wound #3 Left,Lateral Lower Leg o Topical Lidocaine 4% cream applied to wound bed prior to debridement (In Clinic Only). Primary Wound Dressing Wound #3 Left,Lateral Lower Leg o Silver Alginate Secondary Dressing Wound #3 Left,Lateral Lower Leg o Gauze and Kerlix/Conform Dressing Change Frequency Wound #3 Left,Lateral Lower Leg o Change Dressing Monday, Wednesday, Friday - Patient is going to return to clinic in 2 weeks so on weeks when patient is not seen in clinic, [facility] to visit patient 3 times on Monday, Wednesday and Friday; other weeks Monday and Wednesday - Patient needs to be seen for wound care on Mondays and Wednesdays and Fridays. His wife is unable to change his dressing r/t her physical condition Follow-up Appointments Wound #3 Left,Lateral Lower Leg o Return Appointment in 2 weeks. - Patient is going to return to clinic in 2 weeks so on weeks when patient is not seen in clinic, [facility] to visit patient 3 times on Monday, Wednesday and Friday; other weeks Monday and Wednesday - Patient needs to be seen for wound care on Mondays and Wednesdays and Fridays. His wife is unable to change his dressing r/t her physical condition Edema Control [name], [name]. ([id number]) Wound #3 Left,Lateral Lower Leg o Patient to wear own compression stockings - Tubigrip in clinic [place] #3 Left,Lateral Lower Leg o [place] Visits -  [facility]- Patient is going to return to clinic in 2 weeks so on weeks when patient is not seen in clinic, [facility] to visit patient 3 times on Monday, Wednesday and Friday; other weeks Monday and Wednesday - Patient needs to be seen for wound care on Mondays and Wednesdays and Fridays. His wife is unable to change his dressing r/t her physical condition o Home Health Nurse may visit PRN to address patientos wound care needs. o FACE TO FACE ENCOUNTER: MEDICARE and MEDICAID PATIENTS: I certify that this patient is under my care and that I had a face-to-face encounter that meets the physician face-to-face encounter requirements with this patient on this date. The encounter with the patient was in whole or in part for the following MEDICAL CONDITION: (primary reason for [place]) MEDICAL NECESSITY: I certify, that based on my findings, NURSING services are a medically necessary home health service. HOME BOUND STATUS: I certify that my clinical findings support that this patient is homebound (i.e., Due to illness or injury, pt requires aid of supportive devices such as crutches, cane, wheelchairs, walkers, the use of special transportation or the assistance of another person to leave their place of residence. There is a normal inability to leave the home and doing so requires considerable and taxing effort. Other absences are for medical reasons / religious services and are infrequent or of short duration when for other reasons). o If current dressing causes regression in wound condition, may D/C ordered dressing product/s and apply Normal Saline Moist Dressing daily until next [place] / Other MD appointment. [place] of regression in wound condition at [phone]. o Please direct any NON-WOUND related issues/requests for orders to patient's Primary Care Physician Electronic Signature(s) Signed: 03/31/2018 5:12:41 PM By:  Montey Hora Signed: 04/01/2018  12:35:32 AM By: Worthy Keeler PA-C Entered By: Montey Hora on 03/31/2018 10:40:34 [name] ([id number]) -------------------------------------------------------------------------------- Problem List Details Patient Name: Shawn Bark T. Date of Service: 03/31/2018 10:15 AM Medical Record Number: [id number] Patient Account Number: [id number] Date of Birth/Sex: 27-Mar-1932 (84 y.o. M) Treating RN: Montey Hora Primary Care Provider: Webb Silversmith Other Clinician: Referring Provider: Webb Silversmith Treating Provider/Extender: Melburn Hake, Champion Corales Weeks in Treatment: 16 Active Problems ICD-10 Evaluated Encounter Code Description Active Date Today Diagnosis I89.0 Lymphedema, not elsewhere classified 12/06/2017 No Yes L97.822 Non-pressure chronic ulcer of other part of left lower leg with 12/06/2017 No Yes fat layer exposed E11.622 Type 2 diabetes mellitus with other skin ulcer 12/06/2017 No Yes Z79.01 Long term (current) use of anticoagulants 12/06/2017 No Yes I50.42 Chronic combined systolic (congestive) and diastolic 5/72/6203 No Yes (congestive) heart failure J44.9 Chronic obstructive pulmonary disease, unspecified 12/06/2017 No Yes Inactive Problems Resolved Problems Electronic Signature(s) Signed: 04/01/2018 12:35:32 AM By: Worthy Keeler PA-C Entered By: Worthy Keeler on 03/31/2018 10:30:31 [name] ([id number]) -------------------------------------------------------------------------------- Progress Note Details Patient Name: Shawn Bark T. Date of Service: 03/31/2018 10:15 AM Medical Record Number: [id number] Patient Account Number: [id number] Date of Birth/Sex: 10-03-1931 (84 y.o. M) Treating RN: Montey Hora Primary Care Provider: Webb Silversmith Other Clinician: Referring Provider: Webb Silversmith Treating Provider/Extender: Melburn Hake, Azavion Bouillon Weeks in Treatment: 16 Subjective Chief Complaint Information obtained from Patient Left lateral LE  ulcer History of Present Illness (HPI) The following HPI elements were documented for the patient's wound: Associated Signs and Symptoms: Patient has a history of lymphedema, type II diabetes mellitus, long-term use of Eliquis, congestive heart failure, and COPD. 12/06/17 on evaluation today patient is here for evaluation of an ulcer on the left lateral lower extremity which has been present for roughly 2-3 months. Currently he has been using silver alginate and it was considered putting the patient in an [employer] wrap. However he did have arterial studies which actually showed to be fairly normal on the right however on the left he did have an ABI of 0.75. With that being said this ABI was consistent with at least mild-to-moderate arterial disease. The patient actually has some question on the test as to whether or not this result was actually even accurate his arterial disease could be more significant. For that reason I think he may need to see a vascular specialist to see about possibly undergoing an angiogram to see if arterial flow could be improved. With that being said it does appear that the patient rather desperately needs compression therapy he has significant edema of the bilateral lower extremities noted during evaluation today. No fevers, chills, nausea, or vomiting noted at this time. 12/16/17 on evaluation today patient actually appears to be doing better in regard to his lower extremity ulcers of the left lower extremity. Both wounds are significantly smaller and improved compared to the initial evaluation last week. Overall I'm very pleased with the progress at this time. The patient likewise is pleased with how things appear. He states that he has thought about it and really does not want to have an angiogram that would prefer not to go see the vascular specialist which we had recommended last week. He states when he had this previous on top of the fact that he had issues with  getting the groin area to stop bleeding he states that the die also seemed to cause issues in fact he states through him into having  another heart attack. Obviously he has good reasons to want to try to avoid this which I completely understand. 01/02/18 on evaluation today patient actually appears to be showing signs of good improvement which is excellent news. He has been tolerating the dressing changes without complication. Unfortunately he was in the hospital due to an infection of his left forearm he was on IV antibiotics for six days. The good news is he seems to be doing much better in that regard his left lower extremity also is doing better at this point. There does not appear to be any evidence of infection at this time which is great news. Overall I'm very pleased with the progress he seems to be making. 01/30/18 patient has not been seen our office actually since 23 September. At that point following he apparently healed but then had several areas that we opened shortly following. He has new areas in three different spots on evaluation today. This is on the left lower extremity were the ones previously were. With that being said there does not appear to be any evidence of infection at this time which is good news. No fevers, chills, nausea, or vomiting noted at this time. 02/10/18 on evaluation today patient actually appears to be doing excellent in regard to his lower extremities. He just has one wound that appears to still be open at this time. There does not appear to be any evidence of infection currently. With that being said he has been tolerating the compression that we been utilizing without complication. No fevers, chills, nausea, or vomiting noted at this time. 02/24/18 on evaluation today patient's lower extremity ulcer actually shows signs of good improvement at this time. Fortunately there is no evidence of infection. Overall he has some pain but nothing too  significant. [name], [name] ([id number]) 03/17/18 on evaluation today patient appears to be doing better in regard to the ulcer on his left lower extremity. This is roughly half the size it was when I saw him last at least. Maybe more. Nonetheless overall he seems to be doing excellent with the Current wound care measures. 03/31/18 on evaluation today patient actually appears to be doing very well in regard to his lower extremity ulcer. He does have two areas that are leaking on the anterior portion of his lower extremity but no true ulcerations. The ulcer on the lateral portion of his lower extremity actually is doing better although it still is a little bit open at this point. It has a good granulation surface. Patient History Information obtained from Patient. Family History Diabetes - Maternal Grandparents, Heart Disease - Paternal Grandparents, Hypertension - Paternal Grandparents, Lung Disease - Mother, No family history of Cancer, Hereditary Spherocytosis, Kidney Disease, Seizures, Stroke, Thyroid Problems, Tuberculosis. Social History Former smoker - quit in 1993, Marital Status - Married, Alcohol Use - Never, Drug Use - No History, Caffeine Use - Daily. Medical And Surgical History Notes Neurologic CVA in 1999 Review of Systems (ROS) Constitutional Symptoms (General Health) Denies complaints or symptoms of Fever, Chills. Respiratory The patient has no complaints or symptoms. Cardiovascular Complains or has symptoms of LE edema. Psychiatric The patient has no complaints or symptoms. Objective Constitutional Well-nourished and well-hydrated in no acute distress. Vitals Time Taken: 10:18 AM, Height: 66 in, Weight: 182 lbs, BMI: 29.4, Temperature: 97.7 F, Pulse: 62 bpm, Respiratory Rate: 18 breaths/min, Blood Pressure: 100/60 mmHg. Respiratory normal breathing without difficulty. clear to auscultation bilaterally. Cardiovascular regular rate and rhythm with normal S1,  S2. Psychiatric  INIKO, ROBLES ([id number]) this patient is able to make decisions and demonstrates good insight into disease process. Alert and Oriented x 3. pleasant and cooperative. General Notes: No sharp debridement was required today. Patient tolerated the cleansing of the area without any pain or complication. Again he has declined in the past more advanced intervention with vascular he does not want any further procedures. For that reason we've not been able to do any stronger compression at this point. Again this was patient request. Integumentary (Hair, Skin) Wound #3 status is Open. Original cause of wound was Not Known. The wound is located on the Left,Lateral Lower Leg. The wound measures 0.1cm length x 0.1cm width x 0.1cm depth; 0.008cm^2 area and 0.001cm^3 volume. There is Fat Layer (Subcutaneous Tissue) Exposed exposed. There is no tunneling or undermining noted. There is a none present amount of drainage noted. The wound margin is flat and intact. There is no granulation within the wound bed. There is a medium (34- 66%) amount of necrotic tissue within the wound bed including Eschar. The periwound skin appearance had no abnormalities noted for texture. The periwound skin appearance exhibited: Erythema. The periwound skin appearance did not exhibit: Dry/Scaly, Maceration, Atrophie Blanche, Cyanosis, Ecchymosis, Hemosiderin Staining, Mottled, Pallor, Rubor. The surrounding wound skin color is noted with erythema which is circumferential. Assessment Active Problems ICD-10 Lymphedema, not elsewhere classified Non-pressure chronic ulcer of other part of left lower leg with fat layer exposed Type 2 diabetes mellitus with other skin ulcer Long term (current) use of anticoagulants Chronic combined systolic (congestive) and diastolic (congestive) heart failure Chronic obstructive pulmonary disease, unspecified Plan Wound Cleansing: Wound #3 Left,Lateral Lower Leg: Clean wound  with Normal Saline. Anesthetic (add to Medication List): Wound #3 Left,Lateral Lower Leg: Topical Lidocaine 4% cream applied to wound bed prior to debridement (In Clinic Only). Primary Wound Dressing: Wound #3 Left,Lateral Lower Leg: Silver Alginate Secondary Dressing: Wound #3 Left,Lateral Lower Leg: Gauze and Kerlix/Conform Dressing Change Frequency: Wound #3 Left,Lateral Lower Leg: Change Dressing Monday, Wednesday, Friday - Patient is going to return to clinic in 2 weeks so on weeks when patient is not seen in clinic, [facility] to visit patient 3 times on Monday, Wednesday and Friday; other weeks Monday and Wednesday - Patient needs to be seen for wound care on Mondays and Wednesdays and Fridays. His wife is unable to change his dressing r/t her physical condition [name], Shawn T. ([id number]) Follow-up Appointments: Wound #3 Left,Lateral Lower Leg: Return Appointment in 2 weeks. - Patient is going to return to clinic in 2 weeks so on weeks when patient is not seen in clinic, [facility] to visit patient 3 times on Monday, Wednesday and Friday; other weeks Monday and Wednesday - Patient needs to be seen for wound care on Mondays and Wednesdays and Fridays. His wife is unable to change his dressing r/t her physical condition Edema Control: Wound #3 Left,Lateral Lower Leg: Patient to wear own compression stockings - Tubigrip in clinic Home Health: Wound #3 Left,Lateral Lower Leg: Continue Home Health Visits - [facility]- Patient is going to return to clinic in 2 weeks so on weeks when patient is not seen in clinic, [facility] to visit patient 3 times on Monday, Wednesday and Friday; other weeks Monday and Wednesday - Patient needs to be seen for wound care on Mondays and Wednesdays and Fridays. His wife is unable to change his dressing r/t her physical condition Home Health Nurse may visit PRN to address patient s wound care needs. FACE TO FACE  ENCOUNTER: MEDICARE and MEDICAID PATIENTS: I certify  that this patient is under my care and that I had a face-to-face encounter that meets the physician face-to-face encounter requirements with this patient on this date. The encounter with the patient was in whole or in part for the following MEDICAL CONDITION: (primary reason for [place]) MEDICAL NECESSITY: I certify, that based on my findings, NURSING services are a medically necessary home health service. HOME BOUND STATUS: I certify that my clinical findings support that this patient is homebound (i.e., Due to illness or injury, pt requires aid of supportive devices such as crutches, cane, wheelchairs, walkers, the use of special transportation or the assistance of another person to leave their place of residence. There is a normal inability to leave the home and doing so requires considerable and taxing effort. Other absences are for medical reasons / religious services and are infrequent or of short duration when for other reasons). If current dressing causes regression in wound condition, may D/C ordered dressing product/s and apply Normal Saline Moist Dressing daily until next [place] / Other MD appointment. [place] of regression in wound condition at [phone]. Please direct any NON-WOUND related issues/requests for orders to patient's Primary Care Physician Patient's wound bed currently shows evidence of good granulation at this time. There does not appear to be any signs of infection which is good news. We're gonna check into a Velcro compression wrap for him which I think could be a benefit for him as well. The patient is in agreement that plan. Please see above for specific wound care orders. We will see patient for re-evaluation in 2 week(s) here in the clinic. If anything worsens or changes patient will contact our office for additional recommendations. Electronic Signature(s) Signed: 04/01/2018 12:35:32 AM By: Worthy Keeler PA-C Entered  By: Worthy Keeler on 03/31/2018 10:45:01 Ambrosio, [name] ([id number]) -------------------------------------------------------------------------------- ROS/PFSH Details Patient Name: Shawn Bark T. Date of Service: 03/31/2018 10:15 AM Medical Record Number: [id number] Patient Account Number: [id number] Date of Birth/Sex: [birth year] (84 y.o. M) Treating RN: Montey Hora Primary Care Provider: Webb Silversmith Other Clinician: Referring Provider: Webb Silversmith Treating Provider/Extender: Melburn Hake, Neasia Fleeman Weeks in Treatment: 16 Information Obtained From Patient Wound History Do you currently have one or more open woundso Yes How many open wounds do you currently haveo 1 Approximately how long have you had your woundso 2 months How have you been treating your wound(s) until nowo silver alginate Has your wound(s) ever healed and then re-openedo No Have you had any lab work done in the past montho Yes Who ordered the lab work doneo PCP Have you tested positive for an antibiotic resistant organism (MRSA, VRE)o No Have you tested positive for osteomyelitis (bone infection)o No Have you had any tests for circulation on your legso No Have you had other problems associated with your woundso Infection, Swelling Constitutional Symptoms (General Health) Complaints and Symptoms: Negative for: Fever; Chills Cardiovascular Complaints and Symptoms: Positive for: LE edema Medical History: Positive for: Arrhythmia - a fib; Congestive Heart Failure; Hypertension; Myocardial Infarction - 1997 Negative for: Angina; Coronary Artery Disease; Deep Vein Thrombosis; Hypotension; Peripheral Arterial Disease; Peripheral Venous Disease; Phlebitis; Vasculitis Eyes Medical History: Negative for: Cataracts; Glaucoma; Optic Neuritis Ear/Nose/Mouth/Throat Medical History: Negative for: Chronic sinus problems/congestion; Middle ear problems Hematologic/Lymphatic Medical History: Negative for: Anemia;  Hemophilia; Human Immunodeficiency Virus; Lymphedema; Sickle Cell Disease Respiratory Complaints and Symptoms: No Complaints or Symptoms [name], Shawn T. ([id number]) Medical History: Positive for:  Chronic Obstructive Pulmonary Disease (COPD); Sleep Apnea - CPAP Negative for: Aspiration; Asthma; Pneumothorax; Tuberculosis Gastrointestinal Medical History: Negative for: Cirrhosis ; Colitis; Crohnos; Hepatitis A; Hepatitis B; Hepatitis C Endocrine Medical History: Positive for: Type II Diabetes Negative for: Type I Diabetes Treated with: Insulin, Oral agents Blood sugar tested every day: Yes Tested : QD Genitourinary Medical History: Negative for: End Stage Renal Disease Immunological Medical History: Negative for: Lupus Erythematosus; Raynaudos; Scleroderma Integumentary (Skin) Medical History: Negative for: History of Burn; History of pressure wounds Musculoskeletal Medical History: Positive for: Osteoarthritis Negative for: Gout; Rheumatoid Arthritis; Osteomyelitis Neurologic Medical History: Positive for: Neuropathy Negative for: Dementia; Quadriplegia; Paraplegia; Seizure Disorder Past Medical History Notes: CVA in [place] History: Positive for: Received Radiation - 5 treatments in chest Negative for: Received Chemotherapy Psychiatric Complaints and Symptoms: No Complaints or Symptoms Medical History: [name], [name] ([id number]) Negative for: Anorexia/bulimia; Confinement Anxiety Immunizations Pneumococcal Vaccine: Received Pneumococcal Vaccination: Yes Implantable Devices Family and Social History Cancer: No; Diabetes: Yes - Maternal Grandparents; Heart Disease: Yes - Paternal Grandparents; Hereditary Spherocytosis: No; Hypertension: Yes - Paternal Grandparents; Kidney Disease: No; Lung Disease: Yes - Mother; Seizures: No; Stroke: No; Thyroid Problems: No; Tuberculosis: No; Former smoker - quit in 1993; Marital Status - Married; Alcohol  Use: Never; Drug Use: No History; Caffeine Use: Daily; Financial Concerns: No; Food, Clothing or Shelter Needs: No; Support System Lacking: No; Transportation Concerns: No; Advanced Directives: No; Patient does not want information on Advanced Directives Physician Affirmation I have reviewed and agree with the above information. Electronic Signature(s) Signed: 03/31/2018 5:12:41 PM By: Montey Hora Signed: 04/01/2018 12:35:32 AM By: Worthy Keeler PA-C Entered By: Worthy Keeler on 03/31/2018 10:43:46 Petersen, [name] ([id number]) -------------------------------------------------------------------------------- SuperBill Details Patient Name: Shawn Bark T. Date of Service: 03/31/2018 Medical Record Number: [id number] Patient Account Number: [id number] Date of Birth/Sex: Nov 21, 1931 (84 y.o. M) Treating RN: Montey Hora Primary Care Provider: Webb Silversmith Other Clinician: Referring Provider: Webb Silversmith Treating Provider/Extender: Melburn Hake, Tamora Huneke Weeks in Treatment: 16 Diagnosis Coding ICD-10 Codes Code Description I89.0 Lymphedema, not elsewhere classified L97.822 Non-pressure chronic ulcer of other part of left lower leg with fat layer exposed E11.622 Type 2 diabetes mellitus with other skin ulcer Z79.01 Long term (current) use of anticoagulants I50.42 Chronic combined systolic (congestive) and diastolic (congestive) heart failure J44.9 Chronic obstructive pulmonary disease, unspecified Facility Procedures CPT4 Code: [id number] Description: 99213 - WOUND CARE VISIT-LEV 3 EST PT Modifier: Quantity: 1 Physician Procedures CPT4 Code Description: 5885027 74128 - WC PHYS LEVEL 4 - EST PT ICD-10 Diagnosis Description I89.0 Lymphedema, not elsewhere classified L97.822 Non-pressure chronic ulcer of other part of left lower leg wit E11.622 Type 2 diabetes mellitus with other  skin ulcer Z79.01 Long term (current) use of anticoagulants Modifier: h fat layer expos Quantity: 1  ed Electronic Signature(s) Signed: 04/01/2018 12:35:32 AM By: Worthy Keeler PA-C Entered By: Worthy Keeler on 03/31/2018 10:45:12

## 2018-04-04 ENCOUNTER — Telehealth: Payer: Self-pay | Admitting: Cardiovascular Disease

## 2018-04-04 NOTE — Telephone Encounter (Signed)
°*  STAT* If patient is at the pharmacy, call can be transferred to refill team.   1. Which medications need to be refilled? (please list name of each medication and dose if known) 2.5 mg po BID   2. Which pharmacy/location (including street and city if local pharmacy) is [facility] to be sent to[facility]   3. Do they need a 30 day or 90 day supply? [place]

## 2018-04-04 NOTE — Telephone Encounter (Signed)
Called [facility] and got Eliquis prior authorization approved from now until 04/04/2019.  Case ID [id number].  Called patient and notified that it was approved. He was very Patent attorney.

## 2018-04-10 ENCOUNTER — Encounter: Payer: Self-pay | Admitting: Internal Medicine

## 2018-04-11 ENCOUNTER — Ambulatory Visit: Payer: Medicare Other | Admitting: Internal Medicine

## 2018-04-13 ENCOUNTER — Other Ambulatory Visit: Payer: Self-pay | Admitting: Internal Medicine

## 2018-04-13 ENCOUNTER — Ambulatory Visit (INDEPENDENT_AMBULATORY_CARE_PROVIDER_SITE_OTHER)
Admission: RE | Admit: 2018-04-13 | Discharge: 2018-04-13 | Disposition: A | Discharge status: Home / Self Care | Payer: Medicare Other | Source: Ambulatory Visit | Attending: Internal Medicine | Admitting: Internal Medicine

## 2018-04-13 ENCOUNTER — Encounter: Payer: Self-pay | Admitting: Internal Medicine

## 2018-04-13 ENCOUNTER — Ambulatory Visit (INDEPENDENT_AMBULATORY_CARE_PROVIDER_SITE_OTHER): Payer: Medicare Other | Admitting: Internal Medicine

## 2018-04-13 VITALS — BP 118/66 | HR 85 | Temp 97.9°F | Wt 187.0 lb

## 2018-04-13 DIAGNOSIS — R059 Cough, unspecified: Secondary | ICD-10-CM

## 2018-04-13 DIAGNOSIS — B9789 Other viral agents as the cause of diseases classified elsewhere: Secondary | ICD-10-CM

## 2018-04-13 DIAGNOSIS — R0602 Shortness of breath: Secondary | ICD-10-CM

## 2018-04-13 DIAGNOSIS — J069 Acute upper respiratory infection, unspecified: Secondary | ICD-10-CM | POA: Diagnosis not present

## 2018-04-13 DIAGNOSIS — R05 Cough: Secondary | ICD-10-CM | POA: Diagnosis not present

## 2018-04-13 MED ORDER — HYDROCODONE-HOMATROPINE 5-1.5 MG/5ML PO SYRP: 5.0000 mL | ORAL_SOLUTION | Freq: Three times a day (TID) | ORAL | 0 refills | Status: DC | PRN

## 2018-04-13 NOTE — Patient Instructions (Signed)

## 2018-04-13 NOTE — Progress Notes (Signed)
HPI  Pt presents to the clinic today with c/o nasal congestion, cough and shortness of breath. He reports this started 5 days ago. He is not blowing anything out of his nose. The cough is productive of grey/white mucous. He reports fever as high as 101 but denies chills or body aches. His weight is up 2 lbs today. He has tried cough medication, Tylenol and neb treatments with minimal relief. He has a history of COPD, CHF and DM2. Review of Systems      Past Medical History:  Diagnosis Date  . Arthritis   . CAD    a. MI 01/29/1996 tx'd w/ TPA @ [facility]; b. Myoview 06/2005: EF 50%, scar @ apex, mild peri-infarct ischemia  . Cancer ([place])    skin  . Chronic atrial fibrillation    a. since 2006; b. on warfarin  . Chronic diastolic CHF (congestive heart failure) ([place])    a. echo 04/2006: EF lower limits of nl, mod LVH, mild aortic root dilatation, & mild MR, biatrial enlargement; b. echo 04/2013: EF 60%, mod dilated LA, mild MR & TR, mod pulm HTN w/ RV systolic pressure 53, c. echo 04/21/14: EF 55-60%, unable to exclude WMA, severely dilated LA 6.6 cm, nl RVSP, mildly dilated aortic root  . COPD (chronic obstructive pulmonary disease) ([facility])    oxygen prn at home  . CVA 9675,9163   x2  . DM   . Falls   . GERD (gastroesophageal reflux disease)   . History of kidney stones   . HYPERLIPIDEMIA   . HYPERTENSION   . Kidney stone    a. s/p left ureteral stenting 04/24/14  . Left arm weakness    limited movement. S/P fall injury  . Neuropathy of both feet   . Poor balance   . Wears dentures    full upper and lower    Family History  Problem Relation Age of Onset  . Heart disease Mother   . Heart disease Maternal Grandmother   . Diabetes Maternal Grandmother   . Cancer Neg Hx   . Stroke Neg Hx     Social History   Socioeconomic History  . Marital status: Married    Spouse name: Not on file  . Number of children: Not on file  . Years of education: Not on file  . Highest education level:  Not on file  Occupational History  . Not on file  Social Needs  . Financial resource strain: Not on file  . Food insecurity:    Worry: Not on file    Inability: Not on file  . Transportation needs:    Medical: Not on file    Non-medical: Not on file  Tobacco Use  . Smoking status: Former Smoker    Packs/day: 2.00    Years: 40.00    Pack years: 80.00    Types: Cigarettes    Last attempt to quit: 05/24/1990    Years since quitting: 27.9  . Smokeless tobacco: Former Systems developer    Quit date: 05/24/1990  Substance and Sexual Activity  . Alcohol use: No  . Drug use: No  . Sexual activity: Not Currently  Lifestyle  . Physical activity:    Days per week: Not on file    Minutes per session: Not on file  . Stress: Not on file  Relationships  . Social connections:    Talks on phone: Not on file    Gets together: Not on file    Attends religious service: Not  on file    Active member of club or organization: Not on file    Attends meetings of clubs or organizations: Not on file    Relationship status: Not on file  . Intimate partner violence:    Fear of current or ex partner: Not on file    Emotionally abused: Not on file    Physically abused: Not on file    Forced sexual activity: Not on file  Other Topics Concern  . Not on file  Social History Narrative  . Not on file    Allergies  Allergen Reactions  . Contrast Media [Iodinated Diagnostic Agents] Shortness Of Breath  . Morphine And Related Other (See Comments)    Hallucinations   . Niacin And Related Dermatitis  . Other     Other reaction(s): SHORTNESS OF BREATH  . Amlodipine Rash     Constitutional: Positive fatigue and fever. Denies headache, abrupt weight changes.  HEENT:  Positive nasal congestion. Denies eye redness, eye pain, pressure behind the eyes, facial pain, ear pain, ringing in the ears, wax buildup, runny nose or sore throat. Respiratory: Positive cough and shortness of breat. Denies difficulty breathing.     No other specific complaints in a complete review of systems (except as listed in HPI above).  Objective:   BP 118/66   Pulse 85   Temp 97.9 F (36.6 C) (Oral)   Wt 187 lb (84.8 kg)   SpO2 98%   BMI 30.18 kg/m  Wt Readings from Last 3 Encounters:  04/13/18 187 lb (84.8 kg)  03/29/18 185 lb 4.8 oz (84.1 kg)  02/21/18 191 lb 8 oz (86.9 kg)     General: Appears his stated age, chronically ill appearing, in NAD. HEENT: Head: normal shape and size;  Nose: mucosa pink and moist, septum midline; Throat/Mouth: + PND. Teeth present, mucosa erythematous and moist, no exudate noted, no lesions or ulcerations noted.  Neck: No cervical lymphadenopathy.  Cardiovascular: Irregular with normal rate. 1+ pitting BLE. Pulmonary/Chest: Normal effort and positive vesicular breath sounds with fine crackles in bases. No respiratory distress. No wheezes, or ronchi noted.       Assessment & Plan:   Viral Upper Respiratory Infection with Cough:  Get some rest and drink plenty of water Chest xrat to r/o pna vs edema Mucinex 600 mg Q12H prn eRx for Hycodan cough syrup  RTC as needed or if symptoms persist.   Webb Silversmith, NP

## 2018-04-13 NOTE — Progress Notes (Signed)
yc

## 2018-04-14 ENCOUNTER — Telehealth: Payer: Self-pay

## 2018-04-14 NOTE — Telephone Encounter (Signed)
He is fine to take all 3. He has taken multiple times in the past.

## 2018-04-14 NOTE — Telephone Encounter (Signed)
Olivia Mackie nurse with [facility] is seeing pt and said there is a possible interaction between hycodan, xanax and potassium. Olivia Mackie request cb. [facility] [address] [place]. Pt was seen on 04/13/18.

## 2018-04-15 ENCOUNTER — Other Ambulatory Visit: Payer: Self-pay | Admitting: Internal Medicine

## 2018-04-15 NOTE — Telephone Encounter (Signed)
[name] and she is aware as instructed

## 2018-04-17 ENCOUNTER — Emergency Department: Payer: Medicare Other

## 2018-04-17 ENCOUNTER — Ambulatory Visit: Payer: Medicare Other | Admitting: Physician Assistant

## 2018-04-17 ENCOUNTER — Encounter: Payer: Self-pay | Admitting: Emergency Medicine

## 2018-04-17 ENCOUNTER — Inpatient Hospital Stay
Admission: EM | Admit: 2018-04-17 | Discharge: 2018-04-18 | DRG: 193 | Disposition: A | Discharge status: Home Health | Payer: Medicare Other | Attending: Internal Medicine | Admitting: Internal Medicine

## 2018-04-17 ENCOUNTER — Other Ambulatory Visit: Payer: Self-pay

## 2018-04-17 DIAGNOSIS — I252 Old myocardial infarction: Secondary | ICD-10-CM | POA: Diagnosis not present

## 2018-04-17 DIAGNOSIS — I480 Paroxysmal atrial fibrillation: Secondary | ICD-10-CM | POA: Diagnosis present

## 2018-04-17 DIAGNOSIS — Z9842 Cataract extraction status, left eye: Secondary | ICD-10-CM | POA: Diagnosis not present

## 2018-04-17 DIAGNOSIS — I2510 Atherosclerotic heart disease of native coronary artery without angina pectoris: Secondary | ICD-10-CM | POA: Diagnosis present

## 2018-04-17 DIAGNOSIS — N400 Enlarged prostate without lower urinary tract symptoms: Secondary | ICD-10-CM | POA: Diagnosis present

## 2018-04-17 DIAGNOSIS — Z91041 Radiographic dye allergy status: Secondary | ICD-10-CM

## 2018-04-17 DIAGNOSIS — I482 Chronic atrial fibrillation, unspecified: Secondary | ICD-10-CM | POA: Diagnosis present

## 2018-04-17 DIAGNOSIS — Z961 Presence of intraocular lens: Secondary | ICD-10-CM | POA: Diagnosis present

## 2018-04-17 DIAGNOSIS — Z7901 Long term (current) use of anticoagulants: Secondary | ICD-10-CM

## 2018-04-17 DIAGNOSIS — E039 Hypothyroidism, unspecified: Secondary | ICD-10-CM | POA: Diagnosis present

## 2018-04-17 DIAGNOSIS — Z833 Family history of diabetes mellitus: Secondary | ICD-10-CM

## 2018-04-17 DIAGNOSIS — I110 Hypertensive heart disease with heart failure: Secondary | ICD-10-CM | POA: Diagnosis present

## 2018-04-17 DIAGNOSIS — J440 Chronic obstructive pulmonary disease with acute lower respiratory infection: Secondary | ICD-10-CM | POA: Diagnosis present

## 2018-04-17 DIAGNOSIS — Z8249 Family history of ischemic heart disease and other diseases of the circulatory system: Secondary | ICD-10-CM

## 2018-04-17 DIAGNOSIS — Z85828 Personal history of other malignant neoplasm of skin: Secondary | ICD-10-CM | POA: Diagnosis not present

## 2018-04-17 DIAGNOSIS — K219 Gastro-esophageal reflux disease without esophagitis: Secondary | ICD-10-CM | POA: Diagnosis present

## 2018-04-17 DIAGNOSIS — Z885 Allergy status to narcotic agent status: Secondary | ICD-10-CM

## 2018-04-17 DIAGNOSIS — Z888 Allergy status to other drugs, medicaments and biological substances status: Secondary | ICD-10-CM

## 2018-04-17 DIAGNOSIS — Z9841 Cataract extraction status, right eye: Secondary | ICD-10-CM | POA: Diagnosis not present

## 2018-04-17 DIAGNOSIS — Z87442 Personal history of urinary calculi: Secondary | ICD-10-CM | POA: Diagnosis not present

## 2018-04-17 DIAGNOSIS — Z7952 Long term (current) use of systemic steroids: Secondary | ICD-10-CM

## 2018-04-17 DIAGNOSIS — J441 Chronic obstructive pulmonary disease with (acute) exacerbation: Secondary | ICD-10-CM | POA: Diagnosis present

## 2018-04-17 DIAGNOSIS — Z955 Presence of coronary angioplasty implant and graft: Secondary | ICD-10-CM

## 2018-04-17 DIAGNOSIS — E11649 Type 2 diabetes mellitus with hypoglycemia without coma: Secondary | ICD-10-CM | POA: Diagnosis present

## 2018-04-17 DIAGNOSIS — J101 Influenza due to other identified influenza virus with other respiratory manifestations: Secondary | ICD-10-CM

## 2018-04-17 DIAGNOSIS — E1140 Type 2 diabetes mellitus with diabetic neuropathy, unspecified: Secondary | ICD-10-CM | POA: Diagnosis present

## 2018-04-17 DIAGNOSIS — I5033 Acute on chronic diastolic (congestive) heart failure: Secondary | ICD-10-CM | POA: Diagnosis present

## 2018-04-17 DIAGNOSIS — I340 Nonrheumatic mitral (valve) insufficiency: Secondary | ICD-10-CM | POA: Diagnosis present

## 2018-04-17 DIAGNOSIS — M1990 Unspecified osteoarthritis, unspecified site: Secondary | ICD-10-CM | POA: Diagnosis present

## 2018-04-17 DIAGNOSIS — J9621 Acute and chronic respiratory failure with hypoxia: Secondary | ICD-10-CM | POA: Diagnosis present

## 2018-04-17 DIAGNOSIS — Z8673 Personal history of transient ischemic attack (TIA), and cerebral infarction without residual deficits: Secondary | ICD-10-CM | POA: Diagnosis not present

## 2018-04-17 DIAGNOSIS — Z794 Long term (current) use of insulin: Secondary | ICD-10-CM

## 2018-04-17 DIAGNOSIS — E785 Hyperlipidemia, unspecified: Secondary | ICD-10-CM | POA: Diagnosis present

## 2018-04-17 DIAGNOSIS — Z79890 Hormone replacement therapy (postmenopausal): Secondary | ICD-10-CM

## 2018-04-17 DIAGNOSIS — I272 Pulmonary hypertension, unspecified: Secondary | ICD-10-CM | POA: Diagnosis present

## 2018-04-17 DIAGNOSIS — Z7951 Long term (current) use of inhaled steroids: Secondary | ICD-10-CM

## 2018-04-17 DIAGNOSIS — J1000 Influenza due to other identified influenza virus with unspecified type of pneumonia: Secondary | ICD-10-CM | POA: Diagnosis present

## 2018-04-17 DIAGNOSIS — Z87891 Personal history of nicotine dependence: Secondary | ICD-10-CM

## 2018-04-17 LAB — HEMOGLOBIN A1C
Hgb A1c MFr Bld: 6.5 % — ABNORMAL HIGH (ref 4.8–5.6)
Mean Plasma Glucose: 139.85 mg/dL

## 2018-04-17 LAB — INFLUENZA PANEL BY PCR (TYPE A & B)
Influenza A By PCR: POSITIVE — AB
Influenza B By PCR: NEGATIVE

## 2018-04-17 LAB — CBC
HEMATOCRIT: 47 % (ref 39.0–52.0)
Hemoglobin: 14.6 g/dL (ref 13.0–17.0)
MCH: 29.1 pg (ref 26.0–34.0)
MCHC: 31.1 g/dL (ref 30.0–36.0)
MCV: 93.8 fL (ref 80.0–100.0)
Platelets: 204 10*3/uL (ref 150–400)
RBC: 5.01 MIL/uL (ref 4.22–5.81)
RDW: 17.5 % — AB (ref 11.5–15.5)
WBC: 14.1 10*3/uL — ABNORMAL HIGH (ref 4.0–10.5)
nRBC: 0 % (ref 0.0–0.2)

## 2018-04-17 LAB — BASIC METABOLIC PANEL
Anion gap: 8 (ref 5–15)
BUN: 31 mg/dL — AB (ref 8–23)
CO2: 27 mmol/L (ref 22–32)
CREATININE: 1.57 mg/dL — AB (ref 0.61–1.24)
Calcium: 8.9 mg/dL (ref 8.9–10.3)
Chloride: 107 mmol/L (ref 98–111)
GFR calc Af Amer: 46 mL/min — ABNORMAL LOW (ref >60)
GFR calc non Af Amer: 40 mL/min — ABNORMAL LOW (ref >60)
GLUCOSE: 49 mg/dL — AB (ref 70–99)
Potassium: 4.5 mmol/L (ref 3.5–5.1)
Sodium: 142 mmol/L (ref 135–145)

## 2018-04-17 LAB — BLOOD GAS, VENOUS
ACID-BASE DEFICIT: 1.1 mmol/L (ref 0.0–2.0)
Bicarbonate: 24.8 mmol/L (ref 20.0–28.0)
O2 Saturation: 94.5 %
PH VEN: 7.35 (ref 7.250–7.430)
Patient temperature: 37
pCO2, Ven: 45 mmHg (ref 44.0–60.0)
pO2, Ven: 77 mmHg — ABNORMAL HIGH (ref 32.0–45.0)

## 2018-04-17 LAB — GLUCOSE, CAPILLARY
Glucose-Capillary: 22 mg/dL — CL (ref 70–99)
Glucose-Capillary: 154 mg/dL — ABNORMAL HIGH (ref 70–99)
Glucose-Capillary: 97 mg/dL (ref 70–99)
Glucose-Capillary: 295 mg/dL — ABNORMAL HIGH (ref 70–99)

## 2018-04-17 MED ORDER — ACETAMINOPHEN 650 MG RE SUPP: 650.0000 mg | Freq: Four times a day (QID) | RECTAL | Status: DC | PRN

## 2018-04-17 MED ORDER — NITROGLYCERIN 0.4 MG SL SUBL
0.4000 mg | SUBLINGUAL_TABLET | SUBLINGUAL | Status: DC | PRN
Start: 2018-04-17 — End: 2018-04-18

## 2018-04-17 MED ORDER — SODIUM CHLORIDE 0.9% FLUSH
3.0000 mL | Freq: Two times a day (BID) | INTRAVENOUS | Status: DC
  Administered 2018-04-17 – 2018-04-18 (×3): 3 mL via INTRAVENOUS

## 2018-04-17 MED ORDER — LATANOPROST 0.005 % OP SOLN
1.0000 [drp] | Freq: Every day | OPHTHALMIC | Status: DC
  Administered 2018-04-17: 1 [drp] via OPHTHALMIC
  Filled 2018-04-17: qty 2.5

## 2018-04-17 MED ORDER — IPRATROPIUM-ALBUTEROL 0.5-2.5 (3) MG/3ML IN SOLN
3.0000 mL | Freq: Once | RESPIRATORY_TRACT | Status: AC
  Administered 2018-04-17: 3 mL via RESPIRATORY_TRACT
  Filled 2018-04-17: qty 3

## 2018-04-17 MED ORDER — GABAPENTIN 100 MG PO CAPS
100.0000 mg | ORAL_CAPSULE | Freq: Three times a day (TID) | ORAL | Status: DC
  Administered 2018-04-17 – 2018-04-18 (×2): 100 mg via ORAL
  Filled 2018-04-17 (×2): qty 1

## 2018-04-17 MED ORDER — INSULIN ASPART 100 UNIT/ML ~~LOC~~ SOLN
0.0000 [IU] | Freq: Three times a day (TID) | SUBCUTANEOUS | Status: DC
  Administered 2018-04-18: 9 [IU] via SUBCUTANEOUS
  Administered 2018-04-18: 2 [IU] via SUBCUTANEOUS
  Filled 2018-04-17 (×3): qty 1

## 2018-04-17 MED ORDER — FUROSEMIDE 10 MG/ML IJ SOLN
60.0000 mg | Freq: Once | INTRAMUSCULAR | Status: AC
  Administered 2018-04-17: 60 mg via INTRAVENOUS
  Filled 2018-04-17: qty 8

## 2018-04-17 MED ORDER — IPRATROPIUM-ALBUTEROL 0.5-2.5 (3) MG/3ML IN SOLN
3.0000 mL | Freq: Four times a day (QID) | RESPIRATORY_TRACT | Status: DC
  Administered 2018-04-17 – 2018-04-18 (×5): 3 mL via RESPIRATORY_TRACT
  Filled 2018-04-17 (×5): qty 3

## 2018-04-17 MED ORDER — ONDANSETRON HCL 4 MG/2ML IJ SOLN: 4.0000 mg | Freq: Four times a day (QID) | INTRAMUSCULAR | Status: DC | PRN

## 2018-04-17 MED ORDER — LEVOTHYROXINE SODIUM 50 MCG PO TABS
50.0000 ug | ORAL_TABLET | Freq: Every day | ORAL | Status: DC
  Administered 2018-04-18: 50 ug via ORAL
  Filled 2018-04-17: qty 1

## 2018-04-17 MED ORDER — MONTELUKAST SODIUM 10 MG PO TABS
10.0000 mg | ORAL_TABLET | Freq: Every day | ORAL | Status: DC
  Administered 2018-04-17: 10 mg via ORAL
  Filled 2018-04-17: qty 1

## 2018-04-17 MED ORDER — ALBUTEROL SULFATE (2.5 MG/3ML) 0.083% IN NEBU: 2.5000 mg | INHALATION_SOLUTION | RESPIRATORY_TRACT | Status: DC | PRN

## 2018-04-17 MED ORDER — METHYLPREDNISOLONE SODIUM SUCC 125 MG IJ SOLR
60.0000 mg | Freq: Two times a day (BID) | INTRAMUSCULAR | Status: DC
Start: 2018-04-17 — End: 2018-04-18
  Administered 2018-04-17 – 2018-04-18 (×2): 60 mg via INTRAVENOUS
  Filled 2018-04-17 (×2): qty 2

## 2018-04-17 MED ORDER — FINASTERIDE 5 MG PO TABS
5.0000 mg | ORAL_TABLET | Freq: Every day | ORAL | Status: DC
  Administered 2018-04-18: 5 mg via ORAL
  Filled 2018-04-17: qty 1

## 2018-04-17 MED ORDER — DEXTROSE 50 % IV SOLN
50.0000 mL | Freq: Once | INTRAVENOUS | Status: AC
  Administered 2018-04-17 (×2): 50 mL via INTRAVENOUS

## 2018-04-17 MED ORDER — TRAMADOL HCL 50 MG PO TABS: 50.0000 mg | ORAL_TABLET | Freq: Two times a day (BID) | ORAL | Status: DC | PRN

## 2018-04-17 MED ORDER — ENOXAPARIN SODIUM 40 MG/0.4ML ~~LOC~~ SOLN
40.0000 mg | SUBCUTANEOUS | Status: DC
  Administered 2018-04-17: 40 mg via SUBCUTANEOUS
  Filled 2018-04-17: qty 0.4

## 2018-04-17 MED ORDER — ATORVASTATIN CALCIUM 20 MG PO TABS
40.0000 mg | ORAL_TABLET | Freq: Every day | ORAL | Status: DC
  Administered 2018-04-18: 40 mg via ORAL
  Filled 2018-04-17: qty 2

## 2018-04-17 MED ORDER — POLYETHYLENE GLYCOL 3350 17 G PO PACK: 17.0000 g | PACK | Freq: Every day | ORAL | Status: DC | PRN

## 2018-04-17 MED ORDER — ACETAMINOPHEN 325 MG PO TABS: 650.0000 mg | ORAL_TABLET | Freq: Four times a day (QID) | ORAL | Status: DC | PRN

## 2018-04-17 MED ORDER — POTASSIUM CHLORIDE CRYS ER 20 MEQ PO TBCR
20.0000 meq | EXTENDED_RELEASE_TABLET | Freq: Two times a day (BID) | ORAL | Status: DC
  Administered 2018-04-18: 20 meq via ORAL
  Filled 2018-04-17: qty 1

## 2018-04-17 MED ORDER — ONDANSETRON HCL 4 MG PO TABS: 4.0000 mg | ORAL_TABLET | Freq: Four times a day (QID) | ORAL | Status: DC | PRN

## 2018-04-17 MED ORDER — OSELTAMIVIR PHOSPHATE 75 MG PO CAPS
75.0000 mg | ORAL_CAPSULE | ORAL | Status: AC
  Administered 2018-04-17: 75 mg via ORAL
  Filled 2018-04-17: qty 1

## 2018-04-17 MED ORDER — APIXABAN 2.5 MG PO TABS
2.5000 mg | ORAL_TABLET | Freq: Two times a day (BID) | ORAL | Status: DC
  Administered 2018-04-17 – 2018-04-18 (×2): 2.5 mg via ORAL
  Filled 2018-04-17 (×2): qty 1

## 2018-04-17 MED ORDER — METOPROLOL SUCCINATE ER 50 MG PO TB24
50.0000 mg | ORAL_TABLET | Freq: Two times a day (BID) | ORAL | Status: DC
  Administered 2018-04-17 – 2018-04-18 (×2): 50 mg via ORAL
  Filled 2018-04-17 (×2): qty 1

## 2018-04-17 MED ORDER — DEXTROSE 50 % IV SOLN
INTRAVENOUS | Status: AC
  Administered 2018-04-17: 50 mL via INTRAVENOUS
  Filled 2018-04-17: qty 50

## 2018-04-17 MED ORDER — ALPRAZOLAM 0.25 MG PO TABS: 0.2500 mg | ORAL_TABLET | Freq: Every evening | ORAL | Status: DC | PRN

## 2018-04-17 MED ORDER — TRAZODONE HCL 100 MG PO TABS: 100.0000 mg | ORAL_TABLET | Freq: Every evening | ORAL | Status: DC | PRN

## 2018-04-17 MED ORDER — PREDNISONE 20 MG PO TABS
60.0000 mg | ORAL_TABLET | Freq: Once | ORAL | Status: AC
  Administered 2018-04-17: 60 mg via ORAL
  Filled 2018-04-17: qty 3

## 2018-04-17 MED ORDER — TAMSULOSIN HCL 0.4 MG PO CAPS
0.4000 mg | ORAL_CAPSULE | Freq: Every day | ORAL | Status: DC
  Administered 2018-04-18: 0.4 mg via ORAL
  Filled 2018-04-17: qty 1

## 2018-04-17 MED ORDER — CITALOPRAM HYDROBROMIDE 20 MG PO TABS
10.0000 mg | ORAL_TABLET | Freq: Every day | ORAL | Status: DC
  Administered 2018-04-18: 10 mg via ORAL
  Filled 2018-04-17: qty 1

## 2018-04-17 MED ORDER — ALBUTEROL SULFATE (2.5 MG/3ML) 0.083% IN NEBU
5.0000 mg | INHALATION_SOLUTION | Freq: Once | RESPIRATORY_TRACT | Status: AC
  Administered 2018-04-17: 5 mg via RESPIRATORY_TRACT
  Filled 2018-04-17: qty 6

## 2018-04-17 MED ORDER — MOMETASONE FURO-FORMOTEROL FUM 200-5 MCG/ACT IN AERO
2.0000 | INHALATION_SPRAY | Freq: Two times a day (BID) | RESPIRATORY_TRACT | Status: DC
  Administered 2018-04-17 – 2018-04-18 (×2): 2 via RESPIRATORY_TRACT
  Filled 2018-04-17: qty 8.8

## 2018-04-17 MED ORDER — DORZOLAMIDE HCL 2 % OP SOLN
1.0000 [drp] | Freq: Two times a day (BID) | OPHTHALMIC | Status: DC
  Administered 2018-04-17 – 2018-04-18 (×2): 1 [drp] via OPHTHALMIC
  Filled 2018-04-17: qty 10

## 2018-04-17 NOTE — ED Notes (Signed)
Pt urinated on pads in bed. States he didn't feel like he could get to the urinal in time. Bed pads changed and peri care provided.

## 2018-04-17 NOTE — ED Triage Notes (Signed)
Pt here with c/o increasing shob over the weekend, states was put on antibiotics last week for cough/congestion with no relief, audibly  wheezing in triage, sats 93% on ra.

## 2018-04-17 NOTE — ED Notes (Signed)
Pt alert & oriented, talking with this nurse; cbg 154

## 2018-04-17 NOTE — ED Provider Notes (Signed)
[facility] Emergency Department Provider Note  ____________________________________________  Time seen: Approximately 12:18 PM  I have reviewed the triage vital signs and the nursing notes.   HISTORY  Chief Complaint Shortness of Breath    HPI Gaylen Venning. is a 85 y.o. male with a history of CAD, atrial fibrillation, COPD, CHF who comes the ED complaining of worsening shortness of breath over the past 3 or 4 days.  He saw his doctor 5 days ago with URI symptoms and congestion, was started on azithromycin and cough suppressant, but reports that he is continued to feel worse.  Shortness of breath is been pronounced over the past 2 days.  Denies chest pain fever or chills.      Past Medical History:  Diagnosis Date  . Arthritis   . CAD    a. MI 01/29/1996 tx'd w/ TPA @ [place]; b. Myoview 06/2005: EF 50%, scar @ apex, mild peri-infarct ischemia  . Cancer ([place])    skin  . Chronic atrial fibrillation    a. since 2006; b. on warfarin  . Chronic diastolic CHF (congestive heart failure) ([place])    a. echo 04/2006: EF lower limits of nl, mod LVH, mild aortic root dilatation, & mild MR, biatrial enlargement; b. echo 04/2013: EF 60%, mod dilated LA, mild MR & TR, mod pulm HTN w/ RV systolic pressure 53, c. echo 04/21/14: EF 55-60%, unable to exclude WMA, severely dilated LA 6.6 cm, nl RVSP, mildly dilated aortic root  . COPD (chronic obstructive pulmonary disease) (HCC)    oxygen prn at home  . CVA [id number]   x2  . DM   . Falls   . GERD (gastroesophageal reflux disease)   . History of kidney stones   . HYPERLIPIDEMIA   . HYPERTENSION   . Kidney stone    a. s/p left ureteral stenting 04/24/14  . Left arm weakness    limited movement. S/P fall injury  . Neuropathy of both feet   . Poor balance   . Wears dentures    full upper and lower     Patient Active Problem List   Diagnosis Date Noted  . Cellulitis of arm, left 12/22/2017  . Cellulitis of left  lower extremity 11/22/2017  . Left leg swelling 11/18/2017  . Rash and nonspecific skin eruption 11/18/2017  . Acquired hypothyroidism 11/22/2016  . Osteoarthritis, knee 11/22/2016  . Diabetes type 2, controlled ([place]) 06/13/2015  . BPH (benign prostatic hyperplasia) 05/14/2014  . Anxiety 05/14/2014  . Chronic obstructive pulmonary disease ([place]) 05/14/2014  . Chronic diastolic CHF (congestive heart failure) ([place])   . Insomnia 08/07/2013  . Hyperlipidemia 02/26/2009  . MITRAL REGURGITATION 02/26/2009  . Essential hypertension 02/26/2009  . MI 02/26/2009  . Coronary artery disease of native artery of native heart with stable angina pectoris ([place]) 02/26/2009  . Chronic atrial fibrillation 02/26/2009  . CVA (cerebral vascular accident) ([place]) 02/26/2009     Past Surgical History:  Procedure Laterality Date  . BLADDER SURGERY     stent placement   . CARDIAC CATHETERIZATION  1997   [facility]  . CAROTID STENT INSERTION  1997  . CATARACT EXTRACTION W/PHACO Left 10/11/2017   Procedure: CATARACT EXTRACTION PHACO AND INTRAOCULAR LENS PLACEMENT ([place]) COMPLICATED LEFT DIABETIC;  Surgeon: Leandrew Koyanagi, MD;  Location: [facility];  Service: Ophthalmology;  Laterality: Left;  MALYUGIN Diabetic - insulin  . CATARACT EXTRACTION W/PHACO Right 11/30/2017   Procedure: CATARACT EXTRACTION PHACO AND INTRAOCULAR LENS PLACEMENT ([facility]) COMPLICATED  RIGHT DIABETIC;  Surgeon: Leandrew Koyanagi, MD;  Location: [facility];  Service: Ophthalmology;  Laterality: Right;  Diabetic - insulin  . CIRCUMCISION  2016  . CORONARY ANGIOPLASTY  1997   s/p stent placement x 2   . CYSTOSCOPY W/ URETERAL STENT REMOVAL Left 10/09/2014   Procedure: CYSTOSCOPY WITH STENT REMOVAL;  Surgeon: Hollice Espy, MD;  Location: [facility];  Service: Urology;  Laterality: Left;  . CYSTOSCOPY WITH STENT PLACEMENT Left 10/09/2014   Procedure: CYSTOSCOPY WITH STENT PLACEMENT;  Surgeon: Hollice Espy, MD;  Location: [facility]  [facility];  Service: Urology;  Laterality: Left;  . KIDNEY SURGERY  05/2013   s/p stent placement   . stents ureters Bilateral   . TONSILLECTOMY AND ADENOIDECTOMY  1959  . URETEROSCOPY WITH HOLMIUM LASER LITHOTRIPSY Left 10/09/2014   Procedure: URETEROSCOPY WITH HOLMIUM LASER LITHOTRIPSY;  Surgeon: Hollice Espy, MD;  Location: [facility];  Service: Urology;  Laterality: Left;     Prior to Admission medications   Medication Sig Start Date End Date Taking? Authorizing Provider  acetaminophen (TYLENOL) 650 MG CR tablet Take 650 mg by mouth every 8 (eight) hours as needed for pain.     [provider]  albuterol (PROAIR HFA) 108 (90 Base) MCG/ACT inhaler USE 1 TO 2 INHALATIONS EVERY 4 HOURS AS NEEDED 11/09/17   Wilhelmina Mcardle, MD  ALPRAZolam Duanne Moron) 0.25 MG tablet Take 1 tablet (0.25 mg total) by mouth at bedtime as needed for anxiety. 01/25/18   Jearld Fenton, NP  apixaban (ELIQUIS) 2.5 MG TABS tablet Take 1 tablet (2.5 mg total) by mouth 2 (two) times daily. 03/27/18   Minna Merritts, MD  atorvastatin (LIPITOR) 40 MG tablet TAKE 1 TABLET DAILY 04/17/18   Jearld Fenton, NP  citalopram (CELEXA) 10 MG tablet TAKE 1 TABLET DAILY 03/28/18   Jearld Fenton, NP  clotrimazole (LOTRIMIN) 1 % cream Apply 1 application topically 2 (two) times daily. 11/18/17   Bedsole, Amy E, MD  COLCRYS 0.6 MG tablet TAKE 1 TABLET(0.6 MG) BY MOUTH TWICE DAILY AS NEEDED 02/03/18   Jearld Fenton, NP  diclofenac sodium (VOLTAREN) 1 % GEL Apply 4 g topically 4 (four) times daily. 01/05/18   Copland, Frederico Hamman, MD  dorzolamide (TRUSOPT) 2 % ophthalmic solution Place 1 drop into both eyes 3 (three) times daily.  03/31/16   [provider]  ezetimibe (ZETIA) 10 MG tablet TAKE 1 TABLET DAILY 04/17/18   Jearld Fenton, NP  finasteride (PROSCAR) 5 MG tablet TAKE 1 TABLET DAILY 06/29/17   Jearld Fenton, NP  gabapentin (NEURONTIN) 100 MG capsule TAKE 1 CAPSULE THREE TIMES A DAY 02/27/18   Jearld Fenton, NP   glipiZIDE (GLUCOTROL) 5 MG tablet TAKE 1 TABLET TWICE A DAY BEFORE MEALS 02/01/18   Baity, Coralie Keens, NP  HUMALOG KWIKPEN 100 UNIT/ML KiwkPen INJECT 18 UNITS WITH BREAKFAST, 20 UNITS WITH LUNCH, AND 16 UNITS WITH DINNER 01/20/18   Baity, Coralie Keens, NP  HYDROcodone-homatropine (HYCODAN) 5-1.5 MG/5ML syrup Take 5 mLs by mouth every 8 (eight) hours as needed for cough. 04/13/18   Jearld Fenton, NP  Insulin Pen Needle (BD PEN NEEDLE NANO U/F) 32G X 4 MM MISC USE THREE TIMES A DAY FOR INSULIN ADMINISTRATION 02/15/18   Jearld Fenton, NP  Insulin Syringe-Needle U-100 30G X 1/2" 1 ML MISC 1 each by Does not apply route 3 (three) times daily. 07/19/14   Jearld Fenton, NP  JANUVIA 100 MG tablet TAKE 1  TABLET DAILY 01/23/18   Jearld Fenton, NP  latanoprost (XALATAN) 0.005 % ophthalmic solution Place 1 drop into both eyes at bedtime.  04/18/16   [provider]  LEVEMIR FLEXTOUCH 100 UNIT/ML Pen INJECT 42 UNITS UNDER THE SKIN DAILY AT 10 P.M. 01/20/18   Jearld Fenton, NP  levothyroxine (SYNTHROID, LEVOTHROID) 50 MCG tablet TAKE 1 TABLET DAILY BEFORE BREAKFAST 03/28/18   Jearld Fenton, NP  metolazone (ZAROXOLYN) 5 MG tablet Take 1 tablet (5 mg total) by mouth daily as needed (swelling). 03/13/18   Minna Merritts, MD  metoprolol succinate (TOPROL-XL) 50 MG 24 hr tablet TAKE 1 TABLET TWICE A DAY 01/10/18   Jearld Fenton, NP  montelukast (SINGULAIR) 10 MG tablet TAKE 1 TABLET AT BEDTIME 02/24/18   Jearld Fenton, NP  nitroGLYCERIN (NITROSTAT) 0.4 MG SL tablet Place 0.4 mg under the tongue every 5 (five) minutes as needed.      [provider]  nystatin-triamcinolone (MYCOLOG II) cream Apply 1 application topically 2 (two) times daily.  03/12/14   [provider]  potassium chloride SA (K-DUR,KLOR-CON) 20 MEQ tablet Take 4 tablets (80 meq) by mouth twice daily, take an extra 1 tablet (20 meq) on the days you take metolazone 02/10/18   Minna Merritts, MD  predniSONE (DELTASONE) 10  MG tablet TAKE 1 TABLET DAILY WITH BREAKFAST 10/26/17   Jearld Fenton, NP  SYMBICORT 160-4.5 MCG/ACT inhaler USE 2 INHALATIONS TWICE A DAY Patient taking differently: Inhale 2 puffs into the lungs 2 (two) times daily.  08/31/17   Wilhelmina Mcardle, MD  tamsulosin (FLOMAX) 0.4 MG CAPS capsule Take 1 capsule (0.4 mg total) by mouth daily. 03/23/18   Jearld Fenton, NP  tiotropium (SPIRIVA HANDIHALER) 18 MCG inhalation capsule Place 1 capsule (18 mcg total) daily into inhaler and inhale. 02/14/17   Wilhelmina Mcardle, MD  torsemide (DEMADEX) 20 MG tablet Take 40 mg by mouth 2 (two) times daily.    [provider]  traMADol (ULTRAM) 50 MG tablet Take 1 tablet (50 mg total) by mouth 2 (two) times daily. 03/23/18   Jearld Fenton, NP  traZODone (DESYREL) 50 MG tablet Take 2 tablets (100 mg total) by mouth at bedtime as needed for sleep. 02/16/18   Jearld Fenton, NP     Allergies Contrast media [iodinated diagnostic agents]; Morphine and related; Niacin and related; Other; and Amlodipine   Family History  Problem Relation Age of Onset  . Heart disease Mother   . Heart disease Maternal Grandmother   . Diabetes Maternal Grandmother   . Cancer Neg Hx   . Stroke Neg Hx     Social History Social History   Tobacco Use  . Smoking status: Former Smoker    Packs/day: 2.00    Years: 40.00    Pack years: 80.00    Types: Cigarettes    Last attempt to quit: 05/24/1990    Years since quitting: 27.9  . Smokeless tobacco: Former Systems developer    Quit date: 05/24/1990  Substance Use Topics  . Alcohol use: No  . Drug use: No    Review of Systems  Constitutional:   No fever or chills.  ENT:   No sore throat. No rhinorrhea. Cardiovascular:   No chest pain or syncope. Respiratory:   Positive shortness of breath and nonproductive cough. Gastrointestinal:   Negative for abdominal pain, vomiting and diarrhea.  Musculoskeletal:   Negative for focal pain or swelling All other systems  reviewed and are  negative except as documented above in ROS and HPI.  ____________________________________________   PHYSICAL EXAM:  VITAL SIGNS: ED Triage Vitals  Enc Vitals Group     BP 04/17/18 1013 129/67     Pulse Rate 04/17/18 1013 (!) 101     Resp 04/17/18 1013 (!) 22     Temp 04/17/18 1039 (!) 97.3 F (36.3 C)     Temp Source 04/17/18 1039 Oral     SpO2 04/17/18 1013 93 %     Weight 04/17/18 1014 187 lb (84.8 kg)     Height 04/17/18 1014 5\' 6"  (1.676 m)     Head Circumference --      Peak Flow --      Pain Score 04/17/18 1014 0     Pain Loc --      Pain Edu? --      Excl. in [place]? --     Vital signs reviewed, nursing assessments reviewed.   Constitutional:   Alert and oriented.  Ill-appearing. Eyes:   Conjunctivae are normal. EOMI. PERRL. ENT      Head:   Normocephalic and atraumatic.      Nose:   No congestion/rhinnorhea.       Mouth/Throat:   Dry mucous membranes, no pharyngeal erythema. No peritonsillar mass.       Neck:   No meningismus. Full ROM. Hematological/Lymphatic/Immunilogical:   No cervical lymphadenopathy. Cardiovascular:   RRR. Symmetric bilateral radial and DP pulses.  No murmurs. Cap refill less than 2 seconds. Respiratory:   Increased work of breathing, accessory muscle use.  Tachypnea.  Symmetric air entry in all lung fields with prolonged expiratory phase and diffuse expiratory wheezing. Gastrointestinal:   Soft and nontender. Non distended. There is no CVA tenderness.  No rebound, rigidity, or guarding. Musculoskeletal:   Normal range of motion in all extremities. No joint effusions.  No lower extremity tenderness.  No edema. Neurologic:   Normal speech and language.  Motor grossly intact. No acute focal neurologic deficits are appreciated.  Skin:    Skin is warm, dry and intact. No rash noted.  No petechiae, purpura, or bullae.  ____________________________________________    LABS (pertinent positives/negatives) (all labs ordered are listed, but only  abnormal results are displayed) Labs Reviewed  BASIC METABOLIC PANEL - Abnormal; Notable for the following components:      Result Value   Glucose, Bld 49 (*)    BUN 31 (*)    Creatinine, Ser 1.57 (*)    GFR calc non Af Amer 40 (*)    GFR calc Af Amer 46 (*)    All other components within normal limits  CBC - Abnormal; Notable for the following components:   WBC 14.1 (*)    RDW 17.5 (*)    All other components within normal limits  INFLUENZA PANEL BY PCR (TYPE A & B) - Abnormal; Notable for the following components:   Influenza A By PCR POSITIVE (*)    All other components within normal limits   ____________________________________________   EKG  Interpreted by me Atrial fibrillation, rate of 86, normal axis and intervals.  Poor R wave progression.  Normal ST segments and T waves.  ____________________________________________    RADIOLOGY  Dg Chest 2 View  Result Date: 04/17/2018 CLINICAL DATA:  Shortness of breath, cough and wheezing. EXAM: CHEST - 2 VIEW COMPARISON:  04/13/2018 FINDINGS: Cardiomegaly and aortic atherosclerosis. Increased interstitial density likely represent interstitial edema. Minimal blunting of the posterior costophrenic angles.  No focal consolidation or lobar collapse. IMPRESSION: Probable congestive heart failure with interstitial edema, superimposed on chronic lung disease. Electronically Signed   By: Nelson Chimes M.D.   On: 04/17/2018 11:30    ____________________________________________   PROCEDURES Procedures  ____________________________________________    CLINICAL IMPRESSION / ASSESSMENT AND PLAN / ED COURSE  Pertinent labs & imaging results that were available during my care of the patient were reviewed by me and considered in my medical decision making (see chart for details).      Clinical Course as of Apr 17 1216  Mon Apr 17, 2018  1029 Patient presents with shortness of breath, worsening for the past 4 days.  Not relieved by  cough suppressant or outpatient antibiotics.  Exam is consistent with COPD exacerbation.  Will give prednisone and bronchodilators.  Check chest x-ray.  Temp was 97.3 orally.   [PS]  1205 Positive for influenza.  Still with increased work of breathing.  Chest x-ray consistent with pneumonitis on top of COPD causing acute respiratory failure with hypoxia.  Due to his comorbidities and age, recommend hospitalization and continued supportive care.  Symptoms have been present up to 4 days, but because he is at high risk I will start Tamiflu anyway.  Influenza A By PCR(!): POSITIVE [PS]    Clinical Course User Index [PS] Carrie Mew, MD     ____________________________________________   FINAL CLINICAL IMPRESSION(S) / ED DIAGNOSES    Final diagnoses:  Influenza A  COPD exacerbation ([place])  Acute on chronic respiratory failure with hypoxia ([place])  Influenza pneumonitis   ED Discharge Orders    None      Portions of this note were generated with dragon dictation software. Dictation errors may occur despite best attempts at proofreading.   Carrie Mew, MD 04/17/18 1221

## 2018-04-17 NOTE — H&P (Signed)
[facility] NAME: [name]    MR#:  [id number]  DATE OF BIRTH:  [birth year]  DATE OF ADMISSION:  04/17/2018  PRIMARY CARE PHYSICIAN: Jearld Fenton, NP   REQUESTING/REFERRING PHYSICIAN: Dr. Joni Fears  CHIEF COMPLAINT:   Chief Complaint  Patient presents with  . Shortness of Breath    HISTORY OF PRESENT ILLNESS:  [name]  is a 85 y.o. male with a known history of COPD, diastolic CHF, diabetes mellitus, hypertension presents to the hospital complaining of cough and congestion for a week.  He saw his primary care physician and was put on antibiotics and prednisone with no improvement.  Here patient has been found to have flu a positive along with COPD exacerbation and pulmonary edema on chest x-ray.  He complains of orthopnea, lower extremity swelling and shortness of breath.  Has been compliant with all his medications. Patient on my initial evaluation was 3.  Concerns regarding CO2 narcosis and hypoglycemia.  A VBG was checked which shows normal PCO2.  But an Accu-Chek showed blood sugar 22.  Stat dose of D50 given.  Mental status improved.  PAST MEDICAL HISTORY:   Past Medical History:  Diagnosis Date  . Arthritis   . CAD    a. MI 01/29/1996 tx'd w/ TPA @ [place]; b. Myoview 06/2005: EF 50%, scar @ apex, mild peri-infarct ischemia  . Cancer ([place])    skin  . Chronic atrial fibrillation    a. since 2006; b. on warfarin  . Chronic diastolic CHF (congestive heart failure) ([place])    a. echo 04/2006: EF lower limits of nl, mod LVH, mild aortic root dilatation, & mild MR, biatrial enlargement; b. echo 04/2013: EF 60%, mod dilated LA, mild MR & TR, mod pulm HTN w/ RV systolic pressure 53, c. echo 04/21/14: EF 55-60%, unable to exclude WMA, severely dilated LA 6.6 cm, nl RVSP, mildly dilated aortic root  . COPD (chronic obstructive pulmonary disease) (H[facility])    oxygen prn at home  . CVA 5638,7564   x2  . DM   . Falls   . GERD  (gastroesophageal reflux disease)   . History of kidney stones   . HYPERLIPIDEMIA   . HYPERTENSION   . Kidney stone    a. s/p left ureteral stenting 04/24/14  . Left arm weakness    limited movement. S/P fall injury  . Neuropathy of both feet   . Poor balance   . Wears dentures    full upper and lower    PAST SURGICAL HISTORY:   Past Surgical History:  Procedure Laterality Date  . BLADDER SURGERY     stent placement   . CARDIAC CATHETERIZATION  1997   [facility]  . CAROTID STENT INSERTION  1997  . CATARACT EXTRACTION W/PHACO Left 10/11/2017   Procedure: CATARACT EXTRACTION PHACO AND INTRAOCULAR LENS PLACEMENT ([place]) COMPLICATED LEFT DIABETIC;  Surgeon: Leandrew Koyanagi, MD;  Location: [facility];  Service: Ophthalmology;  Laterality: Left;  MALYUGIN Diabetic - insulin  . CATARACT EXTRACTION W/PHACO Right 11/30/2017   Procedure: CATARACT EXTRACTION PHACO AND INTRAOCULAR LENS PLACEMENT ([place]) COMPLICATED  RIGHT DIABETIC;  Surgeon: Leandrew Koyanagi, MD;  Location: [facility];  Service: Ophthalmology;  Laterality: Right;  Diabetic - insulin  . CIRCUMCISION  2016  . CORONARY ANGIOPLASTY  1997   s/p stent placement x 2   . CYSTOSCOPY W/ URETERAL STENT REMOVAL Left 10/09/2014   Procedure: CYSTOSCOPY WITH STENT REMOVAL;  Surgeon: Caryl Pina  Erlene Quan, MD;  Location: [facility];  Service: Urology;  Laterality: Left;  . CYSTOSCOPY WITH STENT PLACEMENT Left 10/09/2014   Procedure: CYSTOSCOPY WITH STENT PLACEMENT;  Surgeon: Hollice Espy, MD;  Location: [facility];  Service: Urology;  Laterality: Left;  . KIDNEY SURGERY  05/2013   s/p stent placement   . stents ureters Bilateral   . TONSILLECTOMY AND ADENOIDECTOMY  1959  . URETEROSCOPY WITH HOLMIUM LASER LITHOTRIPSY Left 10/09/2014   Procedure: URETEROSCOPY WITH HOLMIUM LASER LITHOTRIPSY;  Surgeon: Hollice Espy, MD;  Location: [facility];  Service: Urology;  Laterality: Left;    SOCIAL HISTORY:   Social History   Tobacco Use  .  Smoking status: Former Smoker    Packs/day: 2.00    Years: 40.00    Pack years: 80.00    Types: Cigarettes    Last attempt to quit: 05/24/1990    Years since quitting: 27.9  . Smokeless tobacco: Former Systems developer    Quit date: 05/24/1990  Substance Use Topics  . Alcohol use: No    FAMILY HISTORY:   Family History  Problem Relation Age of Onset  . Heart disease Mother   . Heart disease Maternal Grandmother   . Diabetes Maternal Grandmother   . Cancer Neg Hx   . Stroke Neg Hx     DRUG ALLERGIES:   Allergies  Allergen Reactions  . Contrast Media [Iodinated Diagnostic Agents] Shortness Of Breath  . Morphine And Related Other (See Comments)    Hallucinations   . Niacin And Related Dermatitis  . Other     Other reaction(s): SHORTNESS OF BREATH  . Amlodipine Rash    REVIEW OF SYSTEMS:   Review of Systems  Constitutional: Positive for chills and malaise/fatigue. Negative for fever and weight loss.  HENT: Negative for hearing loss and nosebleeds.   Eyes: Negative for blurred vision, double vision and pain.  Respiratory: Positive for cough, shortness of breath and wheezing. Negative for hemoptysis and sputum production.   Cardiovascular: Positive for orthopnea and leg swelling. Negative for chest pain and palpitations.  Gastrointestinal: Negative for abdominal pain, constipation, diarrhea, nausea and vomiting.  Genitourinary: Negative for dysuria and hematuria.  Musculoskeletal: Negative for back pain, falls and myalgias.  Skin: Negative for rash.  Neurological: Negative for dizziness, tremors, sensory change, speech change, focal weakness, seizures and headaches.  Endo/Heme/Allergies: Does not bruise/bleed easily.  Psychiatric/Behavioral: Negative for depression and memory loss. The patient is not nervous/anxious.     MEDICATIONS AT HOME:   Prior to Admission medications   Medication Sig Start Date End Date Taking? Authorizing Provider  acetaminophen (TYLENOL) 650 MG CR  tablet Take 650 mg by mouth every 8 (eight) hours as needed for pain.     [provider]  albuterol (PROAIR HFA) 108 (90 Base) MCG/ACT inhaler USE 1 TO 2 INHALATIONS EVERY 4 HOURS AS NEEDED 11/09/17   Wilhelmina Mcardle, MD  ALPRAZolam Duanne Moron) 0.25 MG tablet Take 1 tablet (0.25 mg total) by mouth at bedtime as needed for anxiety. 01/25/18   Jearld Fenton, NP  apixaban (ELIQUIS) 2.5 MG TABS tablet Take 1 tablet (2.5 mg total) by mouth 2 (two) times daily. 03/27/18   Minna Merritts, MD  atorvastatin (LIPITOR) 40 MG tablet TAKE 1 TABLET DAILY 04/17/18   Jearld Fenton, NP  citalopram (CELEXA) 10 MG tablet TAKE 1 TABLET DAILY 03/28/18   Jearld Fenton, NP  clotrimazole (LOTRIMIN) 1 % cream Apply 1 application topically 2 (two) times daily. 11/18/17  Bedsole, Amy E, MD  COLCRYS 0.6 MG tablet TAKE 1 TABLET(0.6 MG) BY MOUTH TWICE DAILY AS NEEDED 02/03/18   Jearld Fenton, NP  diclofenac sodium (VOLTAREN) 1 % GEL Apply 4 g topically 4 (four) times daily. 01/05/18   Copland, Frederico Hamman, MD  dorzolamide (TRUSOPT) 2 % ophthalmic solution Place 1 drop into both eyes 3 (three) times daily.  03/31/16   [provider]  ezetimibe (ZETIA) 10 MG tablet TAKE 1 TABLET DAILY 04/17/18   Jearld Fenton, NP  finasteride (PROSCAR) 5 MG tablet TAKE 1 TABLET DAILY 06/29/17   Jearld Fenton, NP  gabapentin (NEURONTIN) 100 MG capsule TAKE 1 CAPSULE THREE TIMES A DAY 02/27/18   Jearld Fenton, NP  glipiZIDE (GLUCOTROL) 5 MG tablet TAKE 1 TABLET TWICE A DAY BEFORE MEALS 02/01/18   Baity, Coralie Keens, NP  HUMALOG KWIKPEN 100 UNIT/ML KiwkPen INJECT 18 UNITS WITH BREAKFAST, 20 UNITS WITH LUNCH, AND 16 UNITS WITH DINNER 01/20/18   Baity, Coralie Keens, NP  HYDROcodone-homatropine (HYCODAN) 5-1.5 MG/5ML syrup Take 5 mLs by mouth every 8 (eight) hours as needed for cough. 04/13/18   Jearld Fenton, NP  Insulin Pen Needle (BD PEN NEEDLE NANO U/F) 32G X 4 MM MISC USE THREE TIMES A DAY FOR INSULIN ADMINISTRATION 02/15/18   Jearld Fenton, NP  Insulin Syringe-Needle U-100 30G X 1/2" 1 ML MISC 1 each by Does not apply route 3 (three) times daily. 07/19/14   Jearld Fenton, NP  JANUVIA 100 MG tablet TAKE 1 TABLET DAILY 01/23/18   Jearld Fenton, NP  latanoprost (XALATAN) 0.005 % ophthalmic solution Place 1 drop into both eyes at bedtime.  04/18/16   [provider]  LEVEMIR FLEXTOUCH 100 UNIT/ML Pen INJECT 42 UNITS UNDER THE SKIN DAILY AT 10 P.M. 01/20/18   Jearld Fenton, NP  levothyroxine (SYNTHROID, LEVOTHROID) 50 MCG tablet TAKE 1 TABLET DAILY BEFORE BREAKFAST 03/28/18   Jearld Fenton, NP  metolazone (ZAROXOLYN) 5 MG tablet Take 1 tablet (5 mg total) by mouth daily as needed (swelling). 03/13/18   Minna Merritts, MD  metoprolol succinate (TOPROL-XL) 50 MG 24 hr tablet TAKE 1 TABLET TWICE A DAY 01/10/18   Jearld Fenton, NP  montelukast (SINGULAIR) 10 MG tablet TAKE 1 TABLET AT BEDTIME 02/24/18   Jearld Fenton, NP  nitroGLYCERIN (NITROSTAT) 0.4 MG SL tablet Place 0.4 mg under the tongue every 5 (five) minutes as needed.      [provider]  nystatin-triamcinolone (MYCOLOG II) cream Apply 1 application topically 2 (two) times daily.  03/12/14   [provider]  potassium chloride SA (K-DUR,KLOR-CON) 20 MEQ tablet Take 4 tablets (80 meq) by mouth twice daily, take an extra 1 tablet (20 meq) on the days you take metolazone 02/10/18   Minna Merritts, MD  predniSONE (DELTASONE) 10 MG tablet TAKE 1 TABLET DAILY WITH BREAKFAST 10/26/17   Jearld Fenton, NP  SYMBICORT 160-4.5 MCG/ACT inhaler USE 2 INHALATIONS TWICE A DAY Patient taking differently: Inhale 2 puffs into the lungs 2 (two) times daily.  08/31/17   Wilhelmina Mcardle, MD  tamsulosin (FLOMAX) 0.4 MG CAPS capsule Take 1 capsule (0.4 mg total) by mouth daily. 03/23/18   Jearld Fenton, NP  tiotropium (SPIRIVA HANDIHALER) 18 MCG inhalation capsule Place 1 capsule (18 mcg total) daily into inhaler and inhale. 02/14/17   Wilhelmina Mcardle, MD   torsemide (DEMADEX) 20 MG tablet Take 40 mg by mouth 2 (  two) times daily.    [provider]  traMADol (ULTRAM) 50 MG tablet Take 1 tablet (50 mg total) by mouth 2 (two) times daily. 03/23/18   Jearld Fenton, NP  traZODone (DESYREL) 50 MG tablet Take 2 tablets (100 mg total) by mouth at bedtime as needed for sleep. 02/16/18   Jearld Fenton, NP     VITAL SIGNS:  Blood pressure 113/72, pulse 98, temperature (!) 97.3 F (36.3 C), temperature source Oral, resp. rate (!) 21, height 5\' 6"  (1.676 m), weight 84.8 kg, SpO2 90 %.  PHYSICAL EXAMINATION:  Physical Exam  GENERAL:  85 y.o.-year-old patient lying in the bed with conversational dyspnea EYES: Pupils equal, round, reactive to light and accommodation. No scleral icterus. Extraocular muscles intact.  HEENT: Head atraumatic, normocephalic. Oropharynx and nasopharynx clear. No oropharyngeal erythema, moist oral mucosa  NECK:  Supple, no jugular venous distention. No thyroid enlargement, no tenderness.  LUNGS: Increased work of breathing.  Bilateral wheezing.  Decreased air entry. CARDIOVASCULAR: S1, S2 normal. No murmurs, rubs, or gallops.  ABDOMEN: Soft, nontender, nondistended. Bowel sounds present. No organomegaly or mass.  EXTREMITIES: No pedal edema, cyanosis, or clubbing. + 2 pedal & radial pulses b/l.   NEUROLOGIC: Cranial nerves II through XII are intact. No focal Motor or sensory deficits appreciated b/l PSYCHIATRIC: The patient was initially drowsy but after D50 is awake and alert. SKIN: No obvious rash, lesion, or ulcer.   LABORATORY PANEL:   CBC Recent Labs  Lab 04/17/18 1014  WBC 14.1*  HGB 14.6  HCT 47.0  PLT 204   ------------------------------------------------------------------------------------------------------------------  Chemistries  Recent Labs  Lab 04/17/18 1014  NA 142  K 4.5  CL 107  CO2 27  GLUCOSE 49*  BUN 31*  CREATININE 1.57*  CALCIUM 8.9    ------------------------------------------------------------------------------------------------------------------  Cardiac Enzymes No results for input(s): TROPONINI in the last 168 hours. ------------------------------------------------------------------------------------------------------------------  RADIOLOGY:  Dg Chest 2 View  Result Date: 04/17/2018 CLINICAL DATA:  Shortness of breath, cough and wheezing. EXAM: CHEST - 2 VIEW COMPARISON:  04/13/2018 FINDINGS: Cardiomegaly and aortic atherosclerosis. Increased interstitial density likely represent interstitial edema. Minimal blunting of the posterior costophrenic angles. No focal consolidation or lobar collapse. IMPRESSION: Probable congestive heart failure with interstitial edema, superimposed on chronic lung disease. Electronically Signed   By: Nelson Chimes M.D.   On: 04/17/2018 11:30     IMPRESSION AND PLAN:   *Uncontrolled diabetes mellitus with hypoglycemia.  His doses have remained unchanged.  Likely due to missing his breakfast in the morning and coming to the emergency room.  Stat dose of D50 given blood sugars have improved.  Will hold his insulin and oral hypoglycemics at this time. Will have to restart medications soon as his blood sugars might run high due to IV steroids.  Continue Accu-Cheks.  *Acute hypoxic respiratory failure secondary to influenza, COPD exacerbation and CHF.  Wean oxygen as needed.  Nebulizers ordered.  *COPD exacerbation with influenza A -IV steroids - Scheduled Nebulizers - Inhalers -Wean O2 as tolerated - Consult pulmonary if no improvement  *Acute on chronic diastolic congestive heart failure.  Start IV Lasix.  Monitor input and output.  Replace potassium as needed.  Check BMP in the morning.  *Hypertension.  Continue medication  *Paroxysmal atrial fibrillation.  Continue home medications along with Eliquis.  All the records are reviewed and case discussed with ED provider. Management  plans discussed with the patient, family and they are in agreement.  CODE STATUS: Full code  TOTAL CRITICAL CARE TIME TAKING CARE OF THIS PATIENT: 80 minutes.   [name] Hodge Stachnik M.D on 04/17/2018 at 2:15 PM  Between 7am to 6pm - Pager - [phone]  After 6pm go to [email or web address] - password [email or web address] [facility]  Office  [phone]  CC: Primary care physician; Jearld Fenton, NP  Note: This dictation was prepared with Dragon dictation along with smaller phrase technology. Any transcriptional errors that result from this process are unintentional.

## 2018-04-17 NOTE — ED Notes (Signed)
Pt placed on 2L via [place] as o2 sat dipped into 80's and did not recover.

## 2018-04-18 ENCOUNTER — Other Ambulatory Visit: Payer: Self-pay | Admitting: *Deleted

## 2018-04-18 DIAGNOSIS — J449 Chronic obstructive pulmonary disease, unspecified: Secondary | ICD-10-CM

## 2018-04-18 LAB — BASIC METABOLIC PANEL
Anion gap: 10 (ref 5–15)
BUN: 33 mg/dL — ABNORMAL HIGH (ref 8–23)
CO2: 23 mmol/L (ref 22–32)
Calcium: 8.7 mg/dL — ABNORMAL LOW (ref 8.9–10.3)
Chloride: 107 mmol/L (ref 98–111)
Creatinine, Ser: 1.41 mg/dL — ABNORMAL HIGH (ref 0.61–1.24)
GFR calc Af Amer: 53 mL/min — ABNORMAL LOW (ref >60)
GFR calc non Af Amer: 45 mL/min — ABNORMAL LOW (ref >60)
Glucose, Bld: 250 mg/dL — ABNORMAL HIGH (ref 70–99)
Potassium: 4.2 mmol/L (ref 3.5–5.1)
SODIUM: 140 mmol/L (ref 135–145)

## 2018-04-18 LAB — CBC
HCT: 43.2 % (ref 39.0–52.0)
Hemoglobin: 13.7 g/dL (ref 13.0–17.0)
MCH: 28.7 pg (ref 26.0–34.0)
MCHC: 31.7 g/dL (ref 30.0–36.0)
MCV: 90.6 fL (ref 80.0–100.0)
Platelets: 201 10*3/uL (ref 150–400)
RBC: 4.77 MIL/uL (ref 4.22–5.81)
RDW: 17.1 % — ABNORMAL HIGH (ref 11.5–15.5)
WBC: 8 10*3/uL (ref 4.0–10.5)
nRBC: 0 % (ref 0.0–0.2)

## 2018-04-18 LAB — GLUCOSE, CAPILLARY
GLUCOSE-CAPILLARY: 352 mg/dL — AB (ref 70–99)
Glucose-Capillary: 185 mg/dL — ABNORMAL HIGH (ref 70–99)

## 2018-04-18 MED ORDER — INSULIN DETEMIR 100 UNIT/ML ~~LOC~~ SOLN
15.0000 [IU] | Freq: Every day | SUBCUTANEOUS | Status: DC
  Filled 2018-04-18: qty 0.15

## 2018-04-18 MED ORDER — METHYLPREDNISOLONE SODIUM SUCC 40 MG IJ SOLR: 40.0000 mg | Freq: Two times a day (BID) | INTRAMUSCULAR | Status: DC

## 2018-04-18 MED ORDER — OSELTAMIVIR PHOSPHATE 75 MG PO CAPS: 75.0000 mg | ORAL_CAPSULE | Freq: Two times a day (BID) | ORAL | 0 refills | Status: AC

## 2018-04-18 MED ORDER — PREDNISONE 50 MG PO TABS: 50.0000 mg | ORAL_TABLET | Freq: Every day | ORAL | Status: AC

## 2018-04-18 MED ORDER — PREDNISONE 10 MG PO TABS: 10.0000 mg | ORAL_TABLET | Freq: Every day | ORAL | 0 refills | Status: DC

## 2018-04-18 NOTE — Progress Notes (Signed)
Family Meeting Note  Advance Directive:yes  Today a meeting took place with the Patient.  The following clinical team members were present during this meeting:MD  The following were discussed:Patient's diagnosis:copd Influenza A , Patient's progosis: Unable to determine and Goals for treatment: Full Code  Additional follow-up to be provided: FULL CODE no change in AD  Time spent during discussion:16 minutes  Juvenal Umar, MD

## 2018-04-18 NOTE — Progress Notes (Signed)
Attempted to see patient regarding DM however patient not in room and has been discharged.  Thanks,  Adah Perl, RN, BC-ADM Inpatient Diabetes Coordinator Pager [phone]

## 2018-04-18 NOTE — Discharge Summary (Addendum)
[facility] NAME: [name]    MR#:  [id number]  DATE OF BIRTH:  24-Nov-1931  DATE OF ADMISSION:  04/17/2018 ADMITTING PHYSICIAN: Hillary Bow, MD  DATE OF DISCHARGE: April 18, 2018  PRIMARY CARE PHYSICIAN: Jearld Fenton, NP    ADMISSION DIAGNOSIS:  Influenza A [J10.1] COPD exacerbation ([place]) [J44.1] Acute on chronic respiratory failure with hypoxia ([place]) [J96.21]  DISCHARGE DIAGNOSIS:  Active Problems:   COPD exacerbation ([place])   SECONDARY DIAGNOSIS:   Past Medical History:  Diagnosis Date  . Arthritis   . CAD    a. MI 01/29/1996 tx'd w/ TPA @ [facility]; b. Myoview 06/2005: EF 50%, scar @ apex, mild peri-infarct ischemia  . Cancer ([place])    skin  . Chronic atrial fibrillation    a. since 2006; b. on warfarin  . Chronic diastolic CHF (congestive heart failure) ([place])    a. echo 04/2006: EF lower limits of nl, mod LVH, mild aortic root dilatation, & mild MR, biatrial enlargement; b. echo 04/2013: EF 60%, mod dilated LA, mild MR & TR, mod pulm HTN w/ RV systolic pressure 53, c. echo 04/21/14: EF 55-60%, unable to exclude WMA, severely dilated LA 6.6 cm, nl RVSP, mildly dilated aortic root  . COPD (chronic obstructive pulmonary disease) (HCC)    oxygen prn at home  . CVA [id number]   x2  . DM   . Falls   . GERD (gastroesophageal reflux disease)   . History of kidney stones   . HYPERLIPIDEMIA   . HYPERTENSION   . Kidney stone    a. s/p left ureteral stenting 04/24/14  . Left arm weakness    limited movement. S/P fall injury  . Neuropathy of both feet   . Poor balance   . Wears dentures    full upper and lower    HOSPITAL COURSE:   85 year old male with history of diastolic heart failure, COPD and diabetes who presented to the hospital with cough and congestion.  1.  Acute hypoxic respiratory failure in the setting of mild COPD exacerbation along with influenza  2.  Influenza a: Patient will need 5 days of  Tamiflu  3.  Mild COPD exacerbation: Patient will continue outpatient regimen of inhalers.  He will be discharged on prednisone for 4 days then resume his home dose of prednisone  4.  Chronic atrial fibrillation: Continue Eliquis and metoprolol  5.  Hypothyroidism: Continue Synthroid  6.  Diabetes: Patient was initially found to be hypoglycemic however his blood sugars have improved.  He will continue with ADA diet with outpatient regimen  7.  Chronic diastolic heart failure: At the time of discharge patient has no signs of CHF exacerbation.  He will follow-up with his cardiologist Dr. Rockey Situ.  8.  LE wound, followed by wound care center Silicone foam placed over areas of newly re-epithelialized skin.  Recommended patient to place his compression stockings back on the bilateral LEs when he is DC to home today.   Follow up with the wound care center at the time of DC.  DISCHARGE CONDITIONS AND DIET:   Stable for discharge on heart healthy diet  CONSULTS OBTAINED:    DRUG ALLERGIES:   Allergies  Allergen Reactions  . Contrast Media [Iodinated Diagnostic Agents] Shortness Of Breath  . Morphine And Related Other (See Comments)    Hallucinations   . Niacin And Related Dermatitis  . Other     Other reaction(s): SHORTNESS OF BREATH  .  Amlodipine Rash    DISCHARGE MEDICATIONS:   Allergies as of 04/18/2018      Reactions   Contrast Media [iodinated Diagnostic Agents] Shortness Of Breath   Morphine And Related Other (See Comments)   Hallucinations   Niacin And Related Dermatitis   Other    Other reaction(s): SHORTNESS OF BREATH   Amlodipine Rash      Medication List    STOP taking these medications   tiotropium 18 MCG inhalation capsule Commonly known as:  SPIRIVA HANDIHALER     TAKE these medications   acetaminophen 650 MG CR tablet Commonly known as:  TYLENOL Take 650 mg by mouth every 8 (eight) hours as needed for pain.   albuterol 108 (90 Base) MCG/ACT  inhaler Commonly known as:  PROAIR HFA USE 1 TO 2 INHALATIONS EVERY 4 HOURS AS NEEDED What changed:    how much to take  how to take this  when to take this  reasons to take this  additional instructions   ALPRAZolam 0.25 MG tablet Commonly known as:  XANAX Take 1 tablet (0.25 mg total) by mouth at bedtime as needed for anxiety.   apixaban 2.5 MG Tabs tablet Commonly known as:  ELIQUIS Take 1 tablet (2.5 mg total) by mouth 2 (two) times daily.   atorvastatin 40 MG tablet Commonly known as:  LIPITOR TAKE 1 TABLET DAILY   citalopram 10 MG tablet Commonly known as:  CELEXA TAKE 1 TABLET DAILY   clotrimazole 1 % cream Commonly known as:  LOTRIMIN Apply 1 application topically 2 (two) times daily.   COLCRYS 0.6 MG tablet Generic drug:  colchicine TAKE 1 TABLET(0.6 MG) BY MOUTH TWICE DAILY AS NEEDED What changed:  See the new instructions.   diclofenac sodium 1 % Gel Commonly known as:  VOLTAREN Apply 4 g topically 4 (four) times daily.   dorzolamide 2 % ophthalmic solution Commonly known as:  TRUSOPT Place 1 drop into both eyes 3 (three) times daily.   ezetimibe 10 MG tablet Commonly known as:  ZETIA TAKE 1 TABLET DAILY   finasteride 5 MG tablet Commonly known as:  PROSCAR TAKE 1 TABLET DAILY   gabapentin 100 MG capsule Commonly known as:  NEURONTIN TAKE 1 CAPSULE THREE TIMES A DAY   glipiZIDE 5 MG tablet Commonly known as:  GLUCOTROL TAKE 1 TABLET TWICE A DAY BEFORE MEALS What changed:  See the new instructions.   HUMALOG KWIKPEN 100 UNIT/ML KwikPen Generic drug:  insulin lispro INJECT 18 UNITS WITH BREAKFAST, 20 UNITS WITH LUNCH, AND 16 UNITS WITH DINNER   HYDROcodone-homatropine 5-1.5 MG/5ML syrup Commonly known as:  HYCODAN Take 5 mLs by mouth every 8 (eight) hours as needed for cough.   INCRUSE ELLIPTA 62.5 MCG/INH Aepb Generic drug:  umeclidinium bromide Inhale 1 puff into the lungs daily.   Insulin Pen Needle 32G X 4 MM Misc Commonly  known as:  BD PEN NEEDLE NANO U/F USE THREE TIMES A DAY FOR INSULIN ADMINISTRATION   Insulin Syringe-Needle U-100 30G X 1/2" 1 ML Misc 1 each by Does not apply route 3 (three) times daily.   JANUVIA 100 MG tablet Generic drug:  sitaGLIPtin TAKE 1 TABLET DAILY What changed:  how much to take   latanoprost 0.005 % ophthalmic solution Commonly known as:  XALATAN Place 1 drop into both eyes at bedtime.   LEVEMIR FLEXTOUCH 100 UNIT/ML Pen Generic drug:  Insulin Detemir INJECT 42 UNITS UNDER THE SKIN DAILY AT 10 P.M. What changed:  See the  new instructions.   levothyroxine 50 MCG tablet Commonly known as:  SYNTHROID, LEVOTHROID TAKE 1 TABLET DAILY BEFORE BREAKFAST   metolazone 5 MG tablet Commonly known as:  ZAROXOLYN Take 1 tablet (5 mg total) by mouth daily as needed (swelling).   metoprolol succinate 50 MG 24 hr tablet Commonly known as:  TOPROL-XL TAKE 1 TABLET TWICE A DAY   montelukast 10 MG tablet Commonly known as:  SINGULAIR TAKE 1 TABLET AT BEDTIME   nitroGLYCERIN 0.4 MG SL tablet Commonly known as:  NITROSTAT Place 0.4 mg under the tongue every 5 (five) minutes as needed.   nystatin-triamcinolone cream Commonly known as:  MYCOLOG II Apply 1 application topically 2 (two) times daily.   oseltamivir 75 MG capsule Commonly known as:  TAMIFLU Take 1 capsule (75 mg total) by mouth 2 (two) times daily for 4 days.   potassium chloride SA 20 MEQ tablet Commonly known as:  K-DUR,KLOR-CON Take 4 tablets (80 meq) by mouth twice daily, take an extra 1 tablet (20 meq) on the days you take metolazone   predniSONE 50 MG tablet Commonly known as:  DELTASONE Take 1 tablet (50 mg total) by mouth daily with breakfast for 4 days. Do not take while on higher dose of steroids then resume after completed new Rx What changed:    medication strength  how much to take  additional instructions   predniSONE 10 MG tablet Commonly known as:  DELTASONE Take 1 tablet (10 mg total)  by mouth daily with breakfast. What changed:  You were already taking a medication with the same name, and this prescription was added. Make sure you understand how and when to take each.   SYMBICORT 160-4.5 MCG/ACT inhaler Generic drug:  budesonide-formoterol USE 2 INHALATIONS TWICE A DAY What changed:  See the new instructions.   tamsulosin 0.4 MG Caps capsule Commonly known as:  FLOMAX Take 1 capsule (0.4 mg total) by mouth daily.   torsemide 20 MG tablet Commonly known as:  DEMADEX Take 40 mg by mouth 2 (two) times daily.   traMADol 50 MG tablet Commonly known as:  ULTRAM Take 1 tablet (50 mg total) by mouth 2 (two) times daily.   traZODone 50 MG tablet Commonly known as:  DESYREL Take 2 tablets (100 mg total) by mouth at bedtime as needed for sleep.            Durable Medical Equipment  (From admission, onward)         Start     Ordered   04/18/18 1004  DME Oxygen  Once    Question Answer Comment  Mode or (Route) Nasal cannula   Liters per Minute 2   Frequency Continuous (stationary and portable oxygen unit needed)   Oxygen conserving device Yes   Oxygen delivery system Gas      04/18/18 1005            Today   CHIEF COMPLAINT:  Patient feels 100% better than yesterday   VITAL SIGNS:  Blood pressure (!) 141/66, pulse 84, temperature (!) 97.5 F (36.4 C), temperature source Oral, resp. rate 20, height 5\' 6"  (1.676 m), weight 83.5 kg, SpO2 98 %.   REVIEW OF SYSTEMS:  Review of Systems  Constitutional: Negative.  Negative for chills, fever and malaise/fatigue.  HENT: Negative.  Negative for ear discharge, ear pain, hearing loss, nosebleeds and sore throat.   Eyes: Negative.  Negative for blurred vision and pain.  Respiratory: Negative.  Negative for cough, hemoptysis, shortness  of breath and wheezing.   Cardiovascular: Negative.  Negative for chest pain, palpitations and leg swelling.  Gastrointestinal: Negative.  Negative for abdominal pain,  blood in stool, diarrhea, nausea and vomiting.  Genitourinary: Negative.  Negative for dysuria.  Musculoskeletal: Negative.  Negative for back pain.  Skin:       Chronic lower extremity wound  Neurological: Negative for dizziness, tremors, speech change, focal weakness, seizures and headaches.  Endo/Heme/Allergies: Negative.  Does not bruise/bleed easily.  Psychiatric/Behavioral: Negative.  Negative for depression, hallucinations and suicidal ideas.     PHYSICAL EXAMINATION:  GENERAL:  85 y.o.-year-old patient lying in the bed with no acute distress.  NECK:  Supple, no jugular venous distention. No thyroid enlargement, no tenderness.  LUNGS: Normal breath sounds bilaterally, no wheezing, rales,rhonchi  No use of accessory muscles of respiration.  CARDIOVASCULAR: S1, S2 normal. No murmurs, rubs, or gallops.  ABDOMEN: Soft, non-tender, non-distended. Bowel sounds present. No organomegaly or mass.  EXTREMITIES: No pedal edema, cyanosis, or clubbing.  PSYCHIATRIC: The patient is alert and oriented x 3.  SKIN chronic lower extremity wound.   DATA REVIEW:   CBC Recent Labs  Lab 04/18/18 0304  WBC 8.0  HGB 13.7  HCT 43.2  PLT 201    Chemistries  Recent Labs  Lab 04/18/18 0304  NA 140  K 4.2  CL 107  CO2 23  GLUCOSE 250*  BUN 33*  CREATININE 1.41*  CALCIUM 8.7*    Cardiac Enzymes No results for input(s): TROPONINI in the last 168 hours.  Microbiology Results  @MICRORSLT48 @  RADIOLOGY:  Dg Chest 2 View  Result Date: 04/17/2018 CLINICAL DATA:  Shortness of breath, cough and wheezing. EXAM: CHEST - 2 VIEW COMPARISON:  04/13/2018 FINDINGS: Cardiomegaly and aortic atherosclerosis. Increased interstitial density likely represent interstitial edema. Minimal blunting of the posterior costophrenic angles. No focal consolidation or lobar collapse. IMPRESSION: Probable congestive heart failure with interstitial edema, superimposed on chronic lung disease. Electronically Signed    By: Nelson Chimes M.D.   On: 04/17/2018 11:30      Allergies as of 04/18/2018      Reactions   Contrast Media [iodinated Diagnostic Agents] Shortness Of Breath   Morphine And Related Other (See Comments)   Hallucinations   Niacin And Related Dermatitis   Other    Other reaction(s): SHORTNESS OF BREATH   Amlodipine Rash      Medication List    STOP taking these medications   tiotropium 18 MCG inhalation capsule Commonly known as:  SPIRIVA HANDIHALER     TAKE these medications   acetaminophen 650 MG CR tablet Commonly known as:  TYLENOL Take 650 mg by mouth every 8 (eight) hours as needed for pain.   albuterol 108 (90 Base) MCG/ACT inhaler Commonly known as:  PROAIR HFA USE 1 TO 2 INHALATIONS EVERY 4 HOURS AS NEEDED What changed:    how much to take  how to take this  when to take this  reasons to take this  additional instructions   ALPRAZolam 0.25 MG tablet Commonly known as:  XANAX Take 1 tablet (0.25 mg total) by mouth at bedtime as needed for anxiety.   apixaban 2.5 MG Tabs tablet Commonly known as:  ELIQUIS Take 1 tablet (2.5 mg total) by mouth 2 (two) times daily.   atorvastatin 40 MG tablet Commonly known as:  LIPITOR TAKE 1 TABLET DAILY   citalopram 10 MG tablet Commonly known as:  CELEXA TAKE 1 TABLET DAILY   clotrimazole 1 %  cream Commonly known as:  LOTRIMIN Apply 1 application topically 2 (two) times daily.   COLCRYS 0.6 MG tablet Generic drug:  colchicine TAKE 1 TABLET(0.6 MG) BY MOUTH TWICE DAILY AS NEEDED What changed:  See the new instructions.   diclofenac sodium 1 % Gel Commonly known as:  VOLTAREN Apply 4 g topically 4 (four) times daily.   dorzolamide 2 % ophthalmic solution Commonly known as:  TRUSOPT Place 1 drop into both eyes 3 (three) times daily.   ezetimibe 10 MG tablet Commonly known as:  ZETIA TAKE 1 TABLET DAILY   finasteride 5 MG tablet Commonly known as:  PROSCAR TAKE 1 TABLET DAILY   gabapentin 100 MG  capsule Commonly known as:  NEURONTIN TAKE 1 CAPSULE THREE TIMES A DAY   glipiZIDE 5 MG tablet Commonly known as:  GLUCOTROL TAKE 1 TABLET TWICE A DAY BEFORE MEALS What changed:  See the new instructions.   HUMALOG KWIKPEN 100 UNIT/ML KwikPen Generic drug:  insulin lispro INJECT 18 UNITS WITH BREAKFAST, 20 UNITS WITH LUNCH, AND 16 UNITS WITH DINNER   HYDROcodone-homatropine 5-1.5 MG/5ML syrup Commonly known as:  HYCODAN Take 5 mLs by mouth every 8 (eight) hours as needed for cough.   INCRUSE ELLIPTA 62.5 MCG/INH Aepb Generic drug:  umeclidinium bromide Inhale 1 puff into the lungs daily.   Insulin Pen Needle 32G X 4 MM Misc Commonly known as:  BD PEN NEEDLE NANO U/F USE THREE TIMES A DAY FOR INSULIN ADMINISTRATION   Insulin Syringe-Needle U-100 30G X 1/2" 1 ML Misc 1 each by Does not apply route 3 (three) times daily.   JANUVIA 100 MG tablet Generic drug:  sitaGLIPtin TAKE 1 TABLET DAILY What changed:  how much to take   latanoprost 0.005 % ophthalmic solution Commonly known as:  XALATAN Place 1 drop into both eyes at bedtime.   LEVEMIR FLEXTOUCH 100 UNIT/ML Pen Generic drug:  Insulin Detemir INJECT 42 UNITS UNDER THE SKIN DAILY AT 10 P.M. What changed:  See the new instructions.   levothyroxine 50 MCG tablet Commonly known as:  SYNTHROID, LEVOTHROID TAKE 1 TABLET DAILY BEFORE BREAKFAST   metolazone 5 MG tablet Commonly known as:  ZAROXOLYN Take 1 tablet (5 mg total) by mouth daily as needed (swelling).   metoprolol succinate 50 MG 24 hr tablet Commonly known as:  TOPROL-XL TAKE 1 TABLET TWICE A DAY   montelukast 10 MG tablet Commonly known as:  SINGULAIR TAKE 1 TABLET AT BEDTIME   nitroGLYCERIN 0.4 MG SL tablet Commonly known as:  NITROSTAT Place 0.4 mg under the tongue every 5 (five) minutes as needed.   nystatin-triamcinolone cream Commonly known as:  MYCOLOG II Apply 1 application topically 2 (two) times daily.   oseltamivir 75 MG  capsule Commonly known as:  TAMIFLU Take 1 capsule (75 mg total) by mouth 2 (two) times daily for 4 days.   potassium chloride SA 20 MEQ tablet Commonly known as:  K-DUR,KLOR-CON Take 4 tablets (80 meq) by mouth twice daily, take an extra 1 tablet (20 meq) on the days you take metolazone   predniSONE 50 MG tablet Commonly known as:  DELTASONE Take 1 tablet (50 mg total) by mouth daily with breakfast for 4 days. Do not take while on higher dose of steroids then resume after completed new Rx What changed:    medication strength  how much to take  additional instructions   predniSONE 10 MG tablet Commonly known as:  DELTASONE Take 1 tablet (10 mg total) by  mouth daily with breakfast. What changed:  You were already taking a medication with the same name, and this prescription was added. Make sure you understand how and when to take each.   SYMBICORT 160-4.5 MCG/ACT inhaler Generic drug:  budesonide-formoterol USE 2 INHALATIONS TWICE A DAY What changed:  See the new instructions.   tamsulosin 0.4 MG Caps capsule Commonly known as:  FLOMAX Take 1 capsule (0.4 mg total) by mouth daily.   torsemide 20 MG tablet Commonly known as:  DEMADEX Take 40 mg by mouth 2 (two) times daily.   traMADol 50 MG tablet Commonly known as:  ULTRAM Take 1 tablet (50 mg total) by mouth 2 (two) times daily.   traZODone 50 MG tablet Commonly known as:  DESYREL Take 2 tablets (100 mg total) by mouth at bedtime as needed for sleep.            Durable Medical Equipment  (From admission, onward)         Start     Ordered   04/18/18 1004  DME Oxygen  Once    Question Answer Comment  Mode or (Route) Nasal cannula   Liters per Minute 2   Frequency Continuous (stationary and portable oxygen unit needed)   Oxygen conserving device Yes   Oxygen delivery system Gas      04/18/18 1005             Management plans discussed with the patient and he is in agreement. Stable for discharge  home with [facility] PT, RN  Patient should follow up with PCP  CODE STATUS:     Code Status Orders  (From admission, onward)         Start     Ordered   04/17/18 1341  Full code  Continuous     04/17/18 1341        Code Status History    Date Active Date Inactive Code Status Order ID Comments User Context   12/22/2017 0613 12/26/2017 2049 Full Code [id number]  [name], MD Inpatient   10/10/2016 1520 10/11/2016 1829 Full Code [id number]  Demetrios Loll, MD Inpatient   10/10/2014 0258 10/11/2014 1704 Full Code [id number]  Harrie Foreman, MD Inpatient      TOTAL TIME TAKING CARE OF THIS PATIENT: 38 minutes.    Note: This dictation was prepared with Dragon dictation along with smaller phrase technology. Any transcriptional errors that result from this process are unintentional.  Dacy Enrico M.D on 04/18/2018 at 11:49 AM  Between 7am to 6pm - Pager - [phone] After 6pm go to [email or web address] - password [email or web address] [facility]  Office  [phone]  CC: Primary care physician; Jearld Fenton, NP

## 2018-04-18 NOTE — Progress Notes (Signed)
Discharge instructions reviewed with patient including followup visits and new medications.  Understanding was verbalized and all questions were answered.  IV removed without complication; patient tolerated well.  Patient discharged home via wheelchair in stable condition escorted by volunteer staff.

## 2018-04-18 NOTE — Consult Note (Signed)
[facility] Nurse wound consult note Reason for Consult: LE wound, followed by wound care center Wound type: partial thickness, appears venous  Pressure Injury POA: NA Measurement: no truly open areas noted today, scab noted medial calf Other areas all appear to be closed Wound bed: NA Drainage (amount, consistency, odor) scant drainage on ABD pad, none of the absorbant silver dressing have any drainage.  Periwound: intact, 2+ pitting edema in the dorsal foot Dressing procedure/placement/frequency: Silicone foam placed over areas of newly re-epithelialized skin.  Recommended patient to place his compression stockings back on the bilateral LEs when he is DC to home today.   Follow up with the wound care center at the time of DC.   Discussed POC with patient and bedside nurse.  Re consult if needed, will not follow at this time. Thanks  Minta Fair [employer], RN,CWOCN, CNS, [place] [phone])

## 2018-04-18 NOTE — Progress Notes (Signed)
Inpatient Diabetes Program Recommendations  AACE/ADA: New Consensus Statement on Inpatient Glycemic Control (2015)  Target Ranges:  Prepandial:   less than 140 mg/dL      Peak postprandial:   less than 180 mg/dL (1-2 hours)      Critically ill patients:  140 - 180 mg/dL   Lab Results  Component Value Date   GLUCAP 185 (H) 04/18/2018   HGBA1C 6.5 (H) 04/17/2018    Review of Glycemic Control Results for [name], [name]" (MRN [id number]) as of 04/18/2018 10:03  Ref. Range 04/17/2018 13:21 04/17/2018 13:43 04/17/2018 16:54 04/17/2018 21:15 04/18/2018 07:31  Glucose-Capillary Latest Ref Range: 70 - 99 mg/dL 22 (LL) 154 (H) 97 295 (H) 185 (H)   Diabetes history: DM 2 Outpatient Diabetes medications: Glucotrol 5 mg bid, Humalog 18 units with breakfast, 20 units with lunch/ and 16 units with dinner, Januvia 100 mg daily, Levemir 42 units daily Current orders for Inpatient glycemic control:  Novolog sensitive tid with meals, Levemir 15 units q HS Inpatient Diabetes Program Recommendations:   Agree with current orders.  Unsure what prompted hypoglycemic episode on 1/6- Will talk to patient/family today.   Thanks,  Adah Perl, RN, BC-ADM Inpatient Diabetes Coordinator Pager [phone] (8a-5p)

## 2018-04-18 NOTE — Care Management Note (Signed)
Case Management Note  Patient Details  Name: [name]. MRN: [id number] Date of Birth: [birth year]   Patient discharged home today.  Son transported at discharge.  Patient lives at home with wife.  Son provides transportation to all appointments.  Patient is currently open with [facility] home [facility].  RNCM was notified that patient had concerns that home health was not coming to see him as frequently as ordered.  Patient states that initially orders were for dressing changes MWF, however the nurse is only coming out on Tuesday and Thursday for dressing changes.  RNCM confirmed with patient that he wishes to remain with [facility] home [facility]. WOC has seen patient today and states there are no open areas at this time.  Silicone foam was placed over areas of newly re-epithelialized skin, and recommended patient follow up at wound care clinic.  Cassie with [facility] notified of discharge and resumption orders.   Subjective/Objective:                    Action/Plan:   Expected Discharge Date:  04/18/18               Expected Discharge Plan:  [place]  In-House Referral:     Discharge planning Services  CM Consult  Post Acute Care Choice:  Home Health, Resumption of Svcs/PTA Provider Choice offered to:     DME Arranged:    DME Agency:     HH Arranged:  RN, PT, Nurse's Aide [place] Agency:  [facility]  Status of Service:  Completed, signed off  If discussed at [place] of Stay Meetings, dates discussed:    Additional Comments:  Beverly Sessions, RN 04/18/2018, 3:01 PM

## 2018-04-19 ENCOUNTER — Other Ambulatory Visit: Payer: Self-pay | Admitting: Internal Medicine

## 2018-04-19 ENCOUNTER — Telehealth: Payer: Self-pay

## 2018-04-19 NOTE — Telephone Encounter (Signed)
Left message for patient to call back to complete TCM call. Patient already has an appointment with [facility] on 04/24/2018 to follow up.

## 2018-04-20 ENCOUNTER — Other Ambulatory Visit: Payer: Self-pay | Admitting: Internal Medicine

## 2018-04-20 ENCOUNTER — Telehealth: Payer: Self-pay | Admitting: Cardiovascular Disease

## 2018-04-20 NOTE — Telephone Encounter (Signed)
Patient is enrolled in [facility] program and they are sending a fax with weight loss information .  Patient has lost 6 lbs in 3 days and not exhibiting signs of dehydration but was recently admitted to hospital for Flu and sob   Scheduled for 1/13 with PCP for fu

## 2018-04-20 NOTE — Telephone Encounter (Signed)
Lm requesting return call to complete TCM and confirm hosp f/u appt  

## 2018-04-21 NOTE — Telephone Encounter (Signed)
Left voicemail message for [name] Case manager at [facility] regarding patients recent weight loss. Notes mention that patient lost 6 pounds in 3 days with recent diagnosis of Flu and [name] stay as well. He does have upcoming appointment with his PCP on 04/24/2018. Based on note there are no signs or symptoms of dehydration. Scale of weights are with high of 188 and lowest of 180 with fluctuations noted over last 30 days. Left message that based on his recent [name] stay and flu diagnosis and upcoming appointment with PCP that we could see how that appointment goes with instructions to call back if I can assist any further.

## 2018-04-21 NOTE — Telephone Encounter (Signed)
Spoke with case management and she states that his weight is now down 7.4 pounds. Denies dehydration and just does not have a appetite. She reviewed diet and importance of regular meals for healing. She states that he did report feeling some better today and that he is scheduled to see PCP on Monday. She verbalized that she would be calling him to monitor his weights and overall health status and would reach out to [place] again if there were any further concerns.

## 2018-04-22 ENCOUNTER — Other Ambulatory Visit: Payer: Self-pay | Admitting: Internal Medicine

## 2018-04-24 ENCOUNTER — Encounter: Payer: Self-pay | Admitting: Internal Medicine

## 2018-04-24 ENCOUNTER — Ambulatory Visit (INDEPENDENT_AMBULATORY_CARE_PROVIDER_SITE_OTHER): Payer: Medicare Other | Admitting: Internal Medicine

## 2018-04-24 VITALS — BP 120/70 | HR 84 | Temp 97.6°F | Wt 188.0 lb

## 2018-04-24 DIAGNOSIS — J441 Chronic obstructive pulmonary disease with (acute) exacerbation: Secondary | ICD-10-CM

## 2018-04-24 DIAGNOSIS — J101 Influenza due to other identified influenza virus with other respiratory manifestations: Secondary | ICD-10-CM

## 2018-04-24 DIAGNOSIS — J9601 Acute respiratory failure with hypoxia: Secondary | ICD-10-CM

## 2018-04-24 MED ORDER — HYDROCODONE-HOMATROPINE 5-1.5 MG/5ML PO SYRP: 5.0000 mL | ORAL_SOLUTION | Freq: Three times a day (TID) | ORAL | 0 refills | Status: DC | PRN

## 2018-04-24 NOTE — Patient Instructions (Signed)

## 2018-04-24 NOTE — Progress Notes (Signed)
Subjective:    Patient ID: [name]., male    DOB: 13-May-1931, 85 y.o.   MRN: [id number]  HPI  Patient presents to the clinic today for TCM hospital follow-up.  He went to the ER 1/6 with complaint of cough and congestion.  Chest x-ray was negative for infection.  He was diagnosed with acute hypoxic respiratory failure, COPD exacerbation and influenza A.  He was treated with Tamiflu and Prednisone.  He was discharged on 1/7 and advised to continue his home medications including inhalers, and to resume his daily dose of Prednisone once he was finished with the Prednisone prescribed at the hospital.  Since discharge, he reports he is feeling a little better. He still has a productive cough of white/grey mucous. He is not more short of breath than usual. He denies fever, chills or body aches. He would like a refill of Hycodan today, as this is really helping his cough.  Review of Systems      Past Medical History:  Diagnosis Date  . Arthritis   . CAD    a. MI 01/29/1996 tx'd w/ TPA @ [place]; b. Myoview 06/2005: EF 50%, scar @ apex, mild peri-infarct ischemia  . Cancer ([place])    skin  . Chronic atrial fibrillation    a. since 2006; b. on warfarin  . Chronic diastolic CHF (congestive heart failure) ([place])    a. echo 04/2006: EF lower limits of nl, mod LVH, mild aortic root dilatation, & mild MR, biatrial enlargement; b. echo 04/2013: EF 60%, mod dilated LA, mild MR & TR, mod pulm HTN w/ RV systolic pressure 53, c. echo 04/21/14: EF 55-60%, unable to exclude WMA, severely dilated LA 6.6 cm, nl RVSP, mildly dilated aortic root  . COPD (chronic obstructive pulmonary disease) (H[facility])    oxygen prn at home  . CVA [id number],[id number]   x2  . DM   . Falls   . GERD (gastroesophageal reflux disease)   . History of kidney stones   . HYPERLIPIDEMIA   . HYPERTENSION   . Kidney stone    a. s/p left ureteral stenting 04/24/14  . Left arm weakness    limited movement. S/P fall injury  . Neuropathy of both  feet   . Poor balance   . Wears dentures    full upper and lower    Current Outpatient Medications  Medication Sig Dispense Refill  . acetaminophen (TYLENOL) 650 MG CR tablet Take 650 mg by mouth every 8 (eight) hours as needed for pain.     Marland Kitchen albuterol (PROAIR HFA) 108 (90 Base) MCG/ACT inhaler USE 1 TO 2 INHALATIONS EVERY 4 HOURS AS NEEDED (Patient taking differently: Inhale 1-2 puffs into the lungs every 4 (four) hours as needed for wheezing or shortness of breath. ) 25.5 g 3  . ALPRAZolam (XANAX) 0.25 MG tablet Take 1 tablet (0.25 mg total) by mouth at bedtime as needed for anxiety. 90 tablet 0  . apixaban (ELIQUIS) 2.5 MG TABS tablet Take 1 tablet (2.5 mg total) by mouth 2 (two) times daily. 180 tablet 1  . atorvastatin (LIPITOR) 40 MG tablet TAKE 1 TABLET DAILY (Patient taking differently: Take 40 mg by mouth daily. ) 90 tablet 0  . citalopram (CELEXA) 10 MG tablet TAKE 1 TABLET DAILY (Patient taking differently: Take 10 mg by mouth daily. ) 90 tablet 0  . clotrimazole (LOTRIMIN) 1 % cream Apply 1 application topically 2 (two) times daily. 30 g 0  . COLCRYS 0.6 MG  tablet TAKE 1 TABLET(0.6 MG) BY MOUTH TWICE DAILY AS NEEDED (Patient taking differently: Take 0.6 mg by mouth 2 (two) times daily as needed (acute gout flare). ) 20 tablet 0  . diclofenac sodium (VOLTAREN) 1 % GEL Apply 4 g topically 4 (four) times daily. 5 Tube 5  . dorzolamide (TRUSOPT) 2 % ophthalmic solution Place 1 drop into both eyes 3 (three) times daily.     Marland Kitchen ezetimibe (ZETIA) 10 MG tablet TAKE 1 TABLET DAILY (Patient taking differently: Take 10 mg by mouth daily. ) 90 tablet 0  . finasteride (PROSCAR) 5 MG tablet TAKE 1 TABLET DAILY (Patient taking differently: Take 5 mg by mouth daily. ) 90 tablet 4  . gabapentin (NEURONTIN) 100 MG capsule TAKE 1 CAPSULE THREE TIMES A DAY (Patient taking differently: Take 100 mg by mouth 3 (three) times daily. ) 270 capsule 0  . glipiZIDE (GLUCOTROL) 5 MG tablet TAKE 1 TABLET TWICE A  DAY BEFORE MEALS (Patient taking differently: Take 5 mg by mouth 2 (two) times daily before a meal. ) 180 tablet 0  . HUMALOG KWIKPEN 100 UNIT/ML KwikPen INJECT 18 UNITS WITH BREAKFAST, 20 UNITS WITH LUNCH, AND 16 UNITS WITH DINNER 60 mL 3  . HYDROcodone-homatropine (HYCODAN) 5-1.5 MG/5ML syrup Take 5 mLs by mouth every 8 (eight) hours as needed for cough. 120 mL 0  . Insulin Pen Needle (BD PEN NEEDLE NANO U/F) 32G X 4 MM MISC USE THREE TIMES A DAY FOR INSULIN ADMINISTRATION 270 each 4  . Insulin Syringe-Needle U-100 30G X 1/2" 1 ML MISC 1 each by Does not apply route 3 (three) times daily. 200 each 5  . JANUVIA 100 MG tablet TAKE 1 TABLET DAILY (Patient taking differently: Take 100 mg by mouth daily. ) 90 tablet 0  . latanoprost (XALATAN) 0.005 % ophthalmic solution Place 1 drop into both eyes at bedtime.     Marland Kitchen LEVEMIR FLEXTOUCH 100 UNIT/ML Pen INJECT 42 UNITS UNDER THE SKIN DAILY AT 10 P.M. 45 mL 3  . levothyroxine (SYNTHROID, LEVOTHROID) 50 MCG tablet TAKE 1 TABLET DAILY BEFORE BREAKFAST (Patient taking differently: Take 50 mcg by mouth daily before breakfast. ) 90 tablet 0  . metolazone (ZAROXOLYN) 5 MG tablet Take 1 tablet (5 mg total) by mouth daily as needed (swelling). 90 tablet 3  . metoprolol succinate (TOPROL-XL) 50 MG 24 hr tablet TAKE 1 TABLET TWICE A DAY (Patient taking differently: Take 50 mg by mouth 2 (two) times daily. ) 180 tablet 0  . montelukast (SINGULAIR) 10 MG tablet TAKE 1 TABLET AT BEDTIME (Patient taking differently: Take 10 mg by mouth at bedtime. ) 90 tablet 4  . nitroGLYCERIN (NITROSTAT) 0.4 MG SL tablet Place 0.4 mg under the tongue every 5 (five) minutes as needed.      . nystatin-triamcinolone (MYCOLOG II) cream Apply 1 application topically 2 (two) times daily.     . potassium chloride SA (K-DUR,KLOR-CON) 20 MEQ tablet Take 4 tablets (80 meq) by mouth twice daily, take an extra 1 tablet (20 meq) on the days you take metolazone 732 tablet 3  . predniSONE (DELTASONE) 10  MG tablet Take 1 tablet (10 mg total) by mouth daily with breakfast. 20 tablet 0  . SYMBICORT 160-4.5 MCG/ACT inhaler USE 2 INHALATIONS TWICE A DAY (Patient taking differently: Inhale 2 puffs into the lungs 2 (two) times daily. ) 30.6 g 3  . tamsulosin (FLOMAX) 0.4 MG CAPS capsule Take 1 capsule (0.4 mg total) by mouth daily. 90 capsule 0  .  torsemide (DEMADEX) 20 MG tablet Take 40 mg by mouth 2 (two) times daily.    . traMADol (ULTRAM) 50 MG tablet Take 1 tablet (50 mg total) by mouth 2 (two) times daily. 180 tablet 0  . traZODone (DESYREL) 50 MG tablet Take 2 tablets (100 mg total) by mouth at bedtime as needed for sleep. 180 tablet 0  . umeclidinium bromide (INCRUSE ELLIPTA) 62.5 MCG/INH AEPB Inhale 1 puff into the lungs daily.     No current facility-administered medications for this visit.     Allergies  Allergen Reactions  . Contrast Media [Iodinated Diagnostic Agents] Shortness Of Breath  . Morphine And Related Other (See Comments)    Hallucinations   . Niacin And Related Dermatitis  . Other     Other reaction(s): SHORTNESS OF BREATH  . Amlodipine Rash    Family History  Problem Relation Age of Onset  . Heart disease Mother   . Heart disease Maternal Grandmother   . Diabetes Maternal Grandmother   . Cancer Neg Hx   . Stroke Neg Hx     Social History   Socioeconomic History  . Marital status: Married    Spouse name: Not on file  . Number of children: Not on file  . Years of education: Not on file  . Highest education level: Not on file  Occupational History  . Not on file  Social Needs  . Financial resource strain: Not very hard  . Food insecurity:    Worry: Never true    Inability: Never true  . Transportation needs:    Medical: No    Non-medical: No  Tobacco Use  . Smoking status: Former Smoker    Packs/day: 2.00    Years: 40.00    Pack years: 80.00    Types: Cigarettes    Last attempt to quit: 05/24/1990    Years since quitting: 27.9  . Smokeless  tobacco: Former Systems developer    Quit date: 05/24/1990  Substance and Sexual Activity  . Alcohol use: No  . Drug use: No  . Sexual activity: Not Currently  Lifestyle  . Physical activity:    Days per week: Patient refused    Minutes per session: Patient refused  . Stress: Not at all  Relationships  . Social connections:    Talks on phone: Patient refused    Gets together: Patient refused    Attends religious service: Patient refused    Active member of club or organization: Patient refused    Attends meetings of clubs or organizations: Patient refused    Relationship status: Patient refused  . Intimate partner violence:    Fear of current or ex partner: Patient refused    Emotionally abused: Patient refused    Physically abused: Patient refused    Forced sexual activity: Patient refused  Other Topics Concern  . Not on file  Social History Narrative  . Not on file     Constitutional: Pt reports fatigue.Denies fever, malaise, headache or abrupt weight changes.  HEENT: Denies eye pain, eye redness, ear pain, ringing in the ears, wax buildup, runny nose, nasal congestion, bloody nose, or sore throat. Respiratory: Pt reports cough and shortness of breath. Denies difficulty breathing.   Cardiovascular: Pt reports swelling in legs. Denies chest pain, chest tightness, palpitations or swelling in the hands.  Musculoskeletal: Pt reports difficulty with gait. Denies decrease in range of motion, muscle pain or joint pain and swelling.    No other specific complaints in a complete review  of systems (except as listed in HPI above).  Objective:   Physical Exam  BP 120/70   Pulse 84   Temp 97.6 F (36.4 C) (Oral)   Wt 188 lb (85.3 kg)   SpO2 96%   BMI 30.34 kg/m  Wt Readings from Last 3 Encounters:  04/24/18 188 lb (85.3 kg)  04/18/18 184 lb (83.5 kg)  04/13/18 187 lb (84.8 kg)    General: Appears his stated age, chronically ill appearing, in NAD. HEENT: Head: normal shape and size;  Throat/Mouth: Teeth present, mucosa pink and moist, no exudate, lesions or ulcerations noted.  Neck:  No adenopathy noted. Cardiovascular: Normal rate with irregular rhythm. Pulmonary/Chest: Normal effort and diminished breath sounds. No respiratory distress. No wheezes, rales or ronchi noted.  Musculoskeletal: Gait slow and steady with use of rolling walker. Neurological: Alert and oriented.    BMET    Component Value Date/Time   NA 140 04/18/2018 0304   NA 143 02/21/2015 1108   NA 137 08/05/2014 1839   K 4.2 04/18/2018 0304   K 3.6 08/05/2014 1839   CL 107 04/18/2018 0304   CL 106 08/05/2014 1839   CO2 23 04/18/2018 0304   CO2 23 08/05/2014 1839   GLUCOSE 250 (H) 04/18/2018 0304   GLUCOSE 235 (H) 08/05/2014 1839   BUN 33 (H) 04/18/2018 0304   BUN 26 02/21/2015 1108   BUN 19 08/05/2014 1839   CREATININE 1.41 (H) 04/18/2018 0304   CREATININE 1.20 08/05/2014 1839   CALCIUM 8.7 (L) 04/18/2018 0304   CALCIUM 8.6 (L) 08/05/2014 1839   GFRNONAA 45 (L) 04/18/2018 0304   GFRNONAA 57 (L) 08/05/2014 1839   GFRAA 53 (L) 04/18/2018 0304   GFRAA >60 08/05/2014 1839    Lipid Panel     Component Value Date/Time   CHOL 122 02/16/2018 1126   TRIG 189.0 (H) 02/16/2018 1126   HDL 47.50 02/16/2018 1126   CHOLHDL 3 02/16/2018 1126   VLDL 37.8 02/16/2018 1126   LDLCALC 37 02/16/2018 1126    CBC    Component Value Date/Time   WBC 8.0 04/18/2018 0304   RBC 4.77 04/18/2018 0304   HGB 13.7 04/18/2018 0304   HGB 13.3 08/05/2014 1839   HCT 43.2 04/18/2018 0304   HCT 40.1 08/05/2014 1839   PLT 201 04/18/2018 0304   PLT 246 08/05/2014 1839   MCV 90.6 04/18/2018 0304   MCV 83 08/05/2014 1839   MCH 28.7 04/18/2018 0304   MCHC 31.7 04/18/2018 0304   RDW 17.1 (H) 04/18/2018 0304   RDW 16.3 (H) 08/05/2014 1839   LYMPHSABS 1.6 12/21/2017 1717   LYMPHSABS 2.3 08/05/2014 1839   MONOABS 1.6 (H) 12/21/2017 1717   MONOABS 2.0 (H) 08/05/2014 1839   EOSABS 0.0 12/21/2017 1717   EOSABS 0.1  08/05/2014 1839   BASOSABS 0.1 12/21/2017 1717   BASOSABS 0.1 08/05/2014 1839    Hgb A1C Lab Results  Component Value Date   HGBA1C 6.5 (H) 04/17/2018            Assessment & Plan:   [facility] follow-up for Acute Hypoxic Respiratory Failure, COPD Exacerbation and Influenza A:  Hospital notes, labs and imaging reviewed No indication for additional steroids or antibiotics Hycodan refilled Continue current medications as is  Update me if symptoms seem to get worse before Friday  , NP

## 2018-04-25 ENCOUNTER — Telehealth: Payer: Self-pay | Admitting: Licensed Clinical Social Worker

## 2018-04-25 NOTE — Telephone Encounter (Signed)
CSW spoke with patient as a follow up to an EMMI call. Patient tells CSW that he is doing well and that he is not having feelings of sadness. He states that he is feeling better but had some issues with his blood sugar and now it is good. He confirmed he has followed up with his primary care physician and will contact them if he has any issues. He expressed appreciation for CSW assistance. Shela Leff MSW,LCSW [phone]

## 2018-04-29 ENCOUNTER — Other Ambulatory Visit: Payer: Self-pay | Admitting: Internal Medicine

## 2018-05-01 ENCOUNTER — Encounter: Payer: Self-pay | Admitting: Internal Medicine

## 2018-05-02 MED ORDER — AMOXICILLIN-POT CLAVULANATE 875-125 MG PO TABS: 1.0000 | ORAL_TABLET | Freq: Two times a day (BID) | ORAL | 0 refills | Status: DC

## 2018-05-05 ENCOUNTER — Encounter: Payer: Self-pay | Admitting: Internal Medicine

## 2018-05-05 MED ORDER — PREDNISONE 10 MG PO TABS: 10.0000 mg | ORAL_TABLET | Freq: Every day | ORAL | 1 refills | Status: DC

## 2018-05-06 ENCOUNTER — Encounter: Payer: Self-pay | Admitting: Internal Medicine

## 2018-05-06 DIAGNOSIS — F419 Anxiety disorder, unspecified: Secondary | ICD-10-CM

## 2018-05-08 MED ORDER — ALPRAZOLAM 0.25 MG PO TABS: 0.2500 mg | ORAL_TABLET | Freq: Every evening | ORAL | 0 refills | Status: DC | PRN

## 2018-05-16 ENCOUNTER — Telehealth: Payer: Self-pay | Admitting: Cardiovascular Disease

## 2018-05-16 NOTE — Telephone Encounter (Signed)
Lori from [facility] calling to report  Weight loss of 5.4 pounds in 24 hours  Patient took as needed metolazone 5 MG [employer] does not need a call back, please call patient directly with any instructions

## 2018-05-17 NOTE — Telephone Encounter (Signed)
Called and spoke with patient. Says on Monday his weight was up around 185lb so he took metolazone. Yesterday, 2/4 Weight  179.6lb. Today, 2/5 Weight 180lb. States his normal weight is 179-180lb. Patient denies shortness of breath or swelling or chest pain. States he is just "lovely." Nothing further needed at this time.

## 2018-05-18 ENCOUNTER — Encounter: Payer: Medicare Other | Attending: Family Medicine | Admitting: Family Medicine

## 2018-05-18 DIAGNOSIS — I4891 Unspecified atrial fibrillation: Secondary | ICD-10-CM | POA: Insufficient documentation

## 2018-05-18 DIAGNOSIS — I5042 Chronic combined systolic (congestive) and diastolic (congestive) heart failure: Secondary | ICD-10-CM | POA: Diagnosis not present

## 2018-05-18 DIAGNOSIS — L97528 Non-pressure chronic ulcer of other part of left foot with other specified severity: Secondary | ICD-10-CM | POA: Diagnosis not present

## 2018-05-18 DIAGNOSIS — E1140 Type 2 diabetes mellitus with diabetic neuropathy, unspecified: Secondary | ICD-10-CM | POA: Diagnosis not present

## 2018-05-18 DIAGNOSIS — Z7901 Long term (current) use of anticoagulants: Secondary | ICD-10-CM | POA: Insufficient documentation

## 2018-05-18 DIAGNOSIS — J449 Chronic obstructive pulmonary disease, unspecified: Secondary | ICD-10-CM | POA: Insufficient documentation

## 2018-05-18 DIAGNOSIS — L03116 Cellulitis of left lower limb: Secondary | ICD-10-CM | POA: Insufficient documentation

## 2018-05-18 DIAGNOSIS — E11622 Type 2 diabetes mellitus with other skin ulcer: Secondary | ICD-10-CM | POA: Diagnosis not present

## 2018-05-18 DIAGNOSIS — L97518 Non-pressure chronic ulcer of other part of right foot with other specified severity: Secondary | ICD-10-CM | POA: Diagnosis not present

## 2018-05-18 DIAGNOSIS — I252 Old myocardial infarction: Secondary | ICD-10-CM | POA: Diagnosis not present

## 2018-05-18 DIAGNOSIS — I110 Hypertensive heart disease with heart failure: Secondary | ICD-10-CM | POA: Diagnosis not present

## 2018-05-19 ENCOUNTER — Encounter: Payer: Self-pay | Admitting: Internal Medicine

## 2018-05-19 NOTE — Progress Notes (Signed)
[name], [name] ([id number]) Visit Report for 05/18/2018 Abuse/Suicide Risk Screen Details Patient Name: [name], [name]. Date of Service: 05/18/2018 1:00 PM Medical Record Number: [id number] Patient Account Number: [id number] Date of Birth/Sex: Jun 26, 1931 (85 y.o. M) Treating RN: Montey Hora Primary Care Adrianah Prophete: Webb Silversmith Other Clinician: Referring Grisela Mesch: Referral, Self Treating Sadeel Fiddler/Extender: Oneida Arenas in Treatment: 0 Abuse/Suicide Risk Screen Items Answer ABUSE/SUICIDE RISK SCREEN: Has anyone close to you tried to hurt or harm you recentlyo No Do you feel uncomfortable with anyone in your familyo No Has anyone forced you do things that you didnot want to doo No Do you have any thoughts of harming yourselfo No Patient displays signs or symptoms of abuse and/or neglect. No Electronic Signature(s) Signed: 05/18/2018 4:19:21 PM By: Montey Hora Entered By: Montey Hora on 05/18/2018 13:06:06 Marriott, Shawn Pound ([id number]) -------------------------------------------------------------------------------- Activities of Daily Living Details Patient Name: [name], [name] Date of Service: 05/18/2018 1:00 PM Medical Record Number: [id number] Patient Account Number: [id number] Date of Birth/Sex: Sep 09, 1931 (85 y.o. M) Treating RN: Montey Hora Primary Care Cierria Height: Webb Silversmith Other Clinician: Referring Chikita Dogan: Referral, Self Treating Sirron Francesconi/Extender: Oneida Arenas in Treatment: 0 Activities of Daily Living Items Answer Activities of Daily Living (Please select one for each item) Drive Automobile Not Able Take Medications Completely Able Use Telephone Completely Able Care for Appearance Completely Able Use Toilet Completely Able Bath / Shower Need Assistance Dress Self Completely Able Feed Self Completely Able Walk Need Assistance Get In / Out Bed Completely [place] for Self Need Assistance Electronic Signature(s) Signed: 05/18/2018 4:19:21 PM By: Montey Hora Entered By: Montey Hora on 05/18/2018 13:06:40 [name] ([id number]) -------------------------------------------------------------------------------- Education Assessment Details Patient Name: Shawn Bark T. Date of Service: 05/18/2018 1:00 PM Medical Record Number: [id number] Patient Account Number: [id number] Date of Birth/Sex: May 03, 1931 (85 y.o. M) Treating RN: Montey Hora Primary Care Muneeb Veras: Webb Silversmith Other Clinician: Referring Donnice Nielsen: Referral, Self Treating Maggi Hershkowitz/Extender: Oneida Arenas in Treatment: 0 Primary Learner Assessed: Patient Learning Preferences/Education Level/Primary Language Learning Preference: Explanation, Demonstration Highest Education Level: High School Preferred Language: English Cognitive Barrier Assessment/Beliefs Language Barrier: No Translator Needed: No Memory Deficit: No Emotional Barrier: No Cultural/Religious Beliefs Affecting Medical Care: No Physical Barrier Assessment Impaired Vision: No Impaired Hearing: No Decreased Hand dexterity: No Knowledge/Comprehension Assessment Knowledge Level: Medium Comprehension Level: Medium Ability to understand written Medium instructions: Ability to understand verbal Medium instructions: Motivation Assessment Anxiety Level: Calm Cooperation: Cooperative Education Importance: Acknowledges Need Interest in Health Problems: Asks Questions Perception: Coherent Willingness to Engage in Self- Medium Management Activities: Readiness to Engage in Self- Medium Management Activities: Electronic Signature(s) Signed: 05/18/2018 4:19:21 PM By: Montey Hora Entered By: Montey Hora on 05/18/2018 13:07:06 [name] ([id number]) -------------------------------------------------------------------------------- Fall Risk Assessment Details Patient  Name: Shawn Bark T. Date of Service: 05/18/2018 1:00 PM Medical Record Number: [id number] Patient Account Number: [id number] Date of Birth/Sex: [birth year] (85 y.o. M) Treating RN: Montey Hora Primary Care Eyanna Mcgonagle: Webb Silversmith Other Clinician: Referring Jaremy Nosal: Referral, Self Treating Maricus Tanzi/Extender: Oneida Arenas in Treatment: 0 Fall Risk Assessment Items Have you had 2 or more falls in the last 12 monthso 0 Yes Have you had any fall that resulted in injury in the last 12 monthso 0 No FALL RISK ASSESSMENT: History of falling - immediate or within 3 months 0 No Secondary diagnosis 0 No Ambulatory aid None/bed rest/wheelchair/nurse 0 No Crutches/cane/walker 15 Yes Furniture 0 No IV Access/Saline Lock  0 No Gait/Training Normal/bed rest/immobile 0 No Weak 10 Yes Impaired 20 Yes Mental Status Oriented to own ability 0 Yes Electronic Signature(s) Signed: 05/18/2018 4:19:21 PM By: Montey Hora Entered By: Montey Hora on 05/18/2018 13:08:26 [name] ([id number]) -------------------------------------------------------------------------------- Foot Assessment Details Patient Name: Shawn Bark T. Date of Service: 05/18/2018 1:00 PM Medical Record Number: [id number] Patient Account Number: [id number] Date of Birth/Sex: Apr 09, 1932 (85 y.o. M) Treating RN: Montey Hora Primary Care Markeshia Giebel: Webb Silversmith Other Clinician: Referring Arbadella Kimbler: Referral, Self Treating Katrina Daddona/Extender: Oneida Arenas in Treatment: 0 Foot Assessment Items Site Locations + = Sensation present, - = Sensation absent, C = Callus, U = Ulcer R = Redness, W = Warmth, M = Maceration, PU = Pre-ulcerative lesion F = Fissure, S = Swelling, D = Dryness Assessment Right: Left: Other Deformity: No No Prior Foot Ulcer: No No Prior Amputation: No No Charcot Joint: No No Ambulatory Status: Ambulatory With Help Assistance Device: Walker Gait: Steady Electronic  Signature(s) Signed: 05/18/2018 4:19:21 PM By: Montey Hora Entered By: Montey Hora on 05/18/2018 13:20:45 [name], Shawn Pound ([id number]) -------------------------------------------------------------------------------- Nutrition Risk Assessment Details Patient Name: Shawn Bark T. Date of Service: 05/18/2018 1:00 PM Medical Record Number: [id number] Patient Account Number: [id number] Date of Birth/Sex: 1931/10/02 (85 y.o. M) Treating RN: Montey Hora Primary Care Di Jasmer: Webb Silversmith Other Clinician: Referring Will Heinkel: Referral, Self Treating Maisyn Nouri/Extender: Oneida Arenas in Treatment: 0 Height (in): 66 Weight (lbs): 180 Body Mass Index (BMI): 29 Nutrition Risk Assessment Items NUTRITION RISK SCREEN: I have an illness or condition that made me change the kind and/or amount of 0 No food I eat I eat fewer than two meals per day 0 No I eat few fruits and vegetables, or milk products 0 No I have three or more drinks of beer, liquor or wine almost every day 0 No I have tooth or mouth problems that make it hard for me to eat 0 No I don't always have enough money to buy the food I need 0 No I eat alone most of the time 0 No I take three or more different prescribed or over-the-counter drugs a day 1 Yes Without wanting to, I have lost or gained 10 pounds in the last six months 0 No I am not always physically able to shop, cook and/or feed myself 0 No Nutrition Protocols Good Risk Protocol 0 No interventions needed Moderate Risk Protocol Electronic Signature(s) Signed: 05/18/2018 4:19:21 PM By: Montey Hora Entered By: Montey Hora on 05/18/2018 13:08:34

## 2018-05-25 ENCOUNTER — Other Ambulatory Visit: Payer: Self-pay | Admitting: Physician Assistant

## 2018-05-25 ENCOUNTER — Telehealth: Payer: Self-pay

## 2018-05-25 DIAGNOSIS — B999 Unspecified infectious disease: Secondary | ICD-10-CM

## 2018-05-25 NOTE — Telephone Encounter (Signed)
Pt and Horris Latino pts niece on phone; BS has been dropping today. FBS this AM 87; pt ate sausage and egg bun; BS recked was 71; pt felt bad. Now BS 79 and pt just ate one PB &J sandwich with OJ and is starting 2nd PB & J sandwich with OJ now. Pt is feeling little nervous and thirsty. Pt took Januvia 100 mg this morning at breakfast. Pt took Glipizide 5 mg before breakfast this morning and Humalog 18 U at breakfast.  R Baity NP said to stop Januvia for now; Hold Humalog at lunch today. Prior to any meal if BS is < 100 do not take Humalog or Glipizide.  Pt is to monitor BS taking FBS and BS before hs; pt to take BS prior to meals if not feeling well and niece said she will have pt ck BS prior to meals for a few days until BS is more stabilized. When monitoring BS and the BS is running < 100 to notify R BaityNP. Niece voiced understanding and wrote instructions down. FYI to Avie Echevaria NP.

## 2018-05-25 NOTE — Telephone Encounter (Signed)
Will check BS in 1 week, sooner if persistent or reoccurring hypoglycemia.

## 2018-05-26 ENCOUNTER — Ambulatory Visit
Admission: RE | Admit: 2018-05-26 | Discharge: 2018-05-26 | Disposition: A | Discharge status: Home / Self Care | Payer: Medicare Other | Source: Ambulatory Visit | Attending: Physician Assistant | Admitting: Physician Assistant

## 2018-05-26 ENCOUNTER — Encounter: Payer: Medicare Other | Admitting: Physician Assistant

## 2018-05-26 DIAGNOSIS — B999 Unspecified infectious disease: Secondary | ICD-10-CM | POA: Diagnosis not present

## 2018-05-26 DIAGNOSIS — E11622 Type 2 diabetes mellitus with other skin ulcer: Secondary | ICD-10-CM | POA: Diagnosis not present

## 2018-05-28 NOTE — Progress Notes (Signed)
JAIQUAN, TEMME ([id number]) Visit Report for 05/26/2018 Chief Complaint Document Details Patient Name: RAKIN, LEMELLE. Date of Service: 05/26/2018 10:45 AM Medical Record Number: [id number] Patient Account Number: [id number] Date of Birth/Sex: 30-Oct-1931 (85 y.o. M) Treating RN: Montey Hora Primary Care Provider: Webb Silversmith Other Clinician: Referring Provider: Webb Silversmith Treating Provider/Extender: Melburn Hake, HOYT Weeks in Treatment: 1 Information Obtained from: Patient Chief Complaint Left lateral LE ulcer Electronic Signature(s) Signed: 05/28/2018 7:15:48 AM By: Worthy Keeler PA-C Entered By: Worthy Keeler on 05/26/2018 11:10:52 Fritsche, Gavin Pound ([id number]) -------------------------------------------------------------------------------- Debridement Details Patient Name: [name] Date of Service: 05/26/2018 10:45 AM Medical Record Number: [id number] Patient Account Number: [id number] Date of Birth/Sex: 03-17-1932 (85 y.o. M) Treating RN: Montey Hora Primary Care Provider: Webb Silversmith Other Clinician: Referring Provider: Webb Silversmith Treating Provider/Extender: Melburn Hake, HOYT Weeks in Treatment: 1 Debridement Performed for Wound #8 Left,Medial Metatarsal head first Assessment: Performed By: Physician STONE III, HOYT E., PA-C Debridement Type: Debridement Severity of Tissue Pre Fat layer exposed Debridement: Level of Consciousness (Pre- Awake and Alert procedure): Pre-procedure Verification/Time Yes - 11:38 Out Taken: Start Time: 11:38 Pain Control: Lidocaine 4% Topical Solution Total Area Debrided (L x W): 1.4 (cm) x 1.2 (cm) = 1.68 (cm) Tissue and other material Non-Viable, Skin: Dermis , Skin: Epidermis debrided: Level: Skin/Epidermis Debridement Description: Selective/Open Wound Instrument: Curette Bleeding: None End Time: 11:40 Procedural Pain: 0 Post Procedural Pain: 0 Response to Treatment: Procedure was tolerated  well Level of Consciousness Awake and Alert (Post-procedure): Post Debridement Measurements of Total Wound Length: (cm) 1.4 Width: (cm) 1.2 Depth: (cm) 0.1 Volume: (cm) 0.132 Character of Wound/Ulcer Post Debridement: Improved Severity of Tissue Post Debridement: Fat layer exposed Post Procedure Diagnosis Same as Pre-procedure Electronic Signature(s) Signed: 05/26/2018 4:52:28 PM By: Montey Hora Signed: 05/28/2018 7:15:48 AM By: Worthy Keeler PA-C Entered By: Montey Hora on 05/26/2018 11:40:41 Mcmartin, Gavin Pound ([id number]) -------------------------------------------------------------------------------- HPI Details Patient Name: [name] Date of Service: 05/26/2018 10:45 AM Medical Record Number: [id number] Patient Account Number: [id number] Date of Birth/Sex: 1931-08-23 (85 y.o. M) Treating RN: Montey Hora Primary Care Provider: Webb Silversmith Other Clinician: Referring Provider: Webb Silversmith Treating Provider/Extender: Melburn Hake, HOYT Weeks in Treatment: 1 History of Present Illness Associated Signs and Symptoms: Patient has a history of lymphedema, type II diabetes mellitus, long-term use of Eliquis, congestive heart failure, and COPD. HPI Description: 12/06/17 on evaluation today patient is here for evaluation of an ulcer on the left lateral lower extremity which has been present for roughly 2-3 months. Currently he has been using silver alginate and it was considered putting the patient in an [employer] wrap. However he did have arterial studies which actually showed to be fairly normal on the right however on the left he did have an ABI of 0.75. With that being said this ABI was consistent with at least mild-to-moderate arterial disease. The patient actually has some question on the test as to whether or not this result was actually even accurate his arterial disease could be more significant. For that reason I think he may need to see a vascular specialist  to see about possibly undergoing an angiogram to see if arterial flow could be improved. With that being said it does appear that the patient rather desperately needs compression therapy he has significant edema of the bilateral lower extremities noted during evaluation today. No fevers, chills, nausea, or vomiting noted at this time. 12/16/17 on evaluation today patient actually appears to be doing  better in regard to his lower extremity ulcers of the left lower extremity. Both wounds are significantly smaller and improved compared to the initial evaluation last week. Overall I'm very pleased with the progress at this time. The patient likewise is pleased with how things appear. He states that he has thought about it and really does not want to have an angiogram that would prefer not to go see the vascular specialist which we had recommended last week. He states when he had this previous on top of the fact that he had issues with getting the groin area to stop bleeding he states that the die also seemed to cause issues in fact he states through him into having another heart attack. Obviously he has good reasons to want to try to avoid this which I completely understand. 01/02/18 on evaluation today patient actually appears to be showing signs of good improvement which is excellent news. He has been tolerating the dressing changes without complication. Unfortunately he was in the hospital due to an infection of his left forearm he was on IV antibiotics for six days. The good news is he seems to be doing much better in that regard his left lower extremity also is doing better at this point. There does not appear to be any evidence of infection at this time which is great news. Overall I'm very pleased with the progress he seems to be making. 01/30/18 patient has not been seen our office actually since 23 September. At that point following he apparently healed but then had several areas that we opened  shortly following. He has new areas in three different spots on evaluation today. This is on the left lower extremity were the ones previously were. With that being said there does not appear to be any evidence of infection at this time which is good news. No fevers, chills, nausea, or vomiting noted at this time. 02/10/18 on evaluation today patient actually appears to be doing excellent in regard to his lower extremities. He just has one wound that appears to still be open at this time. There does not appear to be any evidence of infection currently. With that being said he has been tolerating the compression that we been utilizing without complication. No fevers, chills, nausea, or vomiting noted at this time. 02/24/18 on evaluation today patient's lower extremity ulcer actually shows signs of good improvement at this time. Fortunately there is no evidence of infection. Overall he has some pain but nothing too significant. 03/17/18 on evaluation today patient appears to be doing better in regard to the ulcer on his left lower extremity. This is roughly half the size it was when I saw him last at least. Maybe more. Nonetheless overall he seems to be doing excellent with the Current wound care measures. 03/31/18 on evaluation today patient actually appears to be doing very well in regard to his lower extremity ulcer. He does have two areas that are leaking on the anterior portion of his lower extremity but no true ulcerations. The ulcer on the lateral portion of his lower extremity actually is doing better although it still is a little bit open at this point. It has a good granulation surface. LADEN, FIELDHOUSE ([id number]) Readmission 05/18/2018 85 year old male with history of lymphedema, type II diabetes mellitus, long-term use of Eliquis, congestive heart failure, and COPD. He was last seen at wound center December 2019. Presents today with bilateral wounds to his great toes and a wound to the  left fifth  metatarsal head area. Increased erythema and edema of the left lower leg. Unable to obtain ABIs. He has a small pin tip size area that is draining fluid. He did mention that there has been an increase in his weight up 185 pounds. He was instructed by his cardiologist to take his prescribed metolazone medication with elevated weight parameters >180 pounds. Inquired how often he has had to take metolazone for weight gain and he stated only once over the last couple of days. Encouraged him to follow-up with his cardiologist for heart failure management and weight fluctuation. VSS. Denies recent fever, chills, dizziness, or SOB. No acute distress during exam today. 05/26/18 patient presents today for reevaluation concerning ongoing issues with his right great toe as well as left great toe and medial first metatarsal region. He has been tolerating the dressing changes without complication. Fortunately there's no signs of infection systemically at this point. No fevers, chills, nausea, or vomiting noted at this time. Unfortunately he still has some erythema noted he did complete the Keflex for the initial seven days although I think he may need additional treatment to make sure this fully clears away. He still has not gotten the x-ray that we sent them for last week. He never obtain the arterial studies previous either he actually healed and then subsequently states that they never called him. Electronic Signature(s) Signed: 05/28/2018 7:15:48 AM By: Worthy Keeler PA-C Entered By: Worthy Keeler on 05/26/2018 11:49:30 Wyke, Gavin Pound ([id number]) -------------------------------------------------------------------------------- Physical Exam Details Patient Name: [name] Date of Service: 05/26/2018 10:45 AM Medical Record Number: [id number] Patient Account Number: [id number] Date of Birth/Sex: 1932/03/02 (85 y.o. M) Treating RN: Montey Hora Primary Care Provider: Webb Silversmith  Other Clinician: Referring Provider: Webb Silversmith Treating Provider/Extender: STONE III, HOYT Weeks in Treatment: 1 Constitutional Well-nourished and well-hydrated in no acute distress. Respiratory normal breathing without difficulty. clear to auscultation bilaterally. Cardiovascular regular rate and rhythm with normal S1, S2. Absent posterior tibial and dorsalis pedis pulses bilateral lower extremities. 3+ pitting edema of the bilateral lower extremities. Psychiatric this patient is able to make decisions and demonstrates good insight into disease process. Alert and Oriented x 3. pleasant and cooperative. Notes Patient's wound bed currently shows signs of erythema around the edge of the wound at this point. Fortunately there is no sign of worsening infection and in fact they tell me that his leg actually appears to be less red and inflamed compared to last week. With that being said there's really nothing for me to culture therefore since he seems to have made some progress with the Keflex I'm leaning towards going ahead and refilling the Keflex for him at this time. I did perform light debridement around the left first metatarsal region to clear away some of the dead skin that was overlying the surface of the wound no bleeding occurred with this debridement. Electronic Signature(s) Signed: 05/28/2018 7:15:48 AM By: Worthy Keeler PA-C Entered By: Worthy Keeler on 05/26/2018 11:50:42 Simms, Gavin Pound ([id number]) -------------------------------------------------------------------------------- Physician Orders Details Patient Name: [name] Date of Service: 05/26/2018 10:45 AM Medical Record Number: [id number] Patient Account Number: [id number] Date of Birth/Sex: [birth year] (85 y.o. M) Treating RN: Montey Hora Primary Care Provider: Webb Silversmith Other Clinician: Referring Provider: Webb Silversmith Treating Provider/Extender: Melburn Hake, HOYT Weeks in Treatment: 1 Verbal  / Phone Orders: No Diagnosis Coding ICD-10 Coding Code Description I89.0 Lymphedema, not elsewhere classified L97.822 Non-pressure chronic ulcer of other part of left lower leg with  fat layer exposed E11.622 Type 2 diabetes mellitus with other skin ulcer Z79.01 Long term (current) use of anticoagulants I50.42 Chronic combined systolic (congestive) and diastolic (congestive) heart failure J44.9 Chronic obstructive pulmonary disease, unspecified L03.116 Cellulitis of left lower limb L97.818 Non-pressure chronic ulcer of other part of right lower leg with other specified severity Wound Cleansing Wound #6 Right Toe Great o Cleanse wound with mild soap and water Wound #7 Left Toe Great o Cleanse wound with mild soap and water Wound #8 Left,Medial Metatarsal head first o Cleanse wound with mild soap and water Primary Wound Dressing Wound #6 Right Toe Great o Alginate - over betadine o Other: - betadine paint Wound #7 Left Toe Great o Alginate - over betadine o Other: - betadine paint Wound #8 Left,Medial Metatarsal head first o Alginate - over betadine o Other: - betadine paint Secondary Dressing Wound #6 Right Toe Great o Dry Gauze o Conform/Kerlix - secure with tape and netting Wound #7 Left Toe Great o Dry Gauze o Conform/Kerlix - secure with tape and netting EVRETT, HAKIM T. ([id number]) Wound #8 Left,Medial Metatarsal head first o Dry Gauze o Conform/Kerlix - secure with tape and netting Dressing Change Frequency Wound #6 Right Toe Great o Change dressing every other day. Wound #7 Left Toe Great o Change dressing every other day. Wound #8 Left,Medial Metatarsal head first o Change dressing every other day. Follow-up Appointments Wound #6 Right Toe Great o Return Appointment in 1 week. Wound #7 Left Toe Great o Return Appointment in 1 week. Wound #8 Left,Medial Metatarsal head first o Return Appointment in 1  week. Off-Loading Wound #7 Left Toe Great o Open toe surgical shoe to: - Both feet Wound #8 Left,Medial Metatarsal head first o Open toe surgical shoe to: - Both feet Home Health Wound #6 Right Toe [place] Nurse may visit PRN to address patientos wound care needs. o FACE TO FACE ENCOUNTER: MEDICARE and MEDICAID PATIENTS: I certify that this patient is under my care and that I had a face-to-face encounter that meets the physician face-to-face encounter requirements with this patient on this date. The encounter with the patient was in whole or in part for the following MEDICAL CONDITION: (primary reason for [place]) MEDICAL NECESSITY: I certify, that based on my findings, NURSING services are a medically necessary home health service. HOME BOUND STATUS: I certify that my clinical findings support that this patient is homebound (i.e., Due to illness or injury, pt requires aid of supportive devices such as crutches, cane, wheelchairs, walkers, the use of special transportation or the assistance of another person to leave their place of residence. There is a normal inability to leave the home and doing so requires considerable and taxing effort. Other absences are for medical reasons / religious services and are infrequent or of short duration when for other reasons). o If current dressing causes regression in wound condition, may D/C ordered dressing product/s and apply Normal Saline Moist Dressing daily until next [place] / Other MD appointment. [place] of regression in wound condition at [phone]. o Please direct any NON-WOUND related issues/requests for orders to patient's Primary Care Physician Wound #7 Left Toe [place] [facility] o Home Health Nurse may visit PRN to address patientos wound care needs. o FACE TO FACE ENCOUNTER: MEDICARE and  MEDICAID PATIENTS: I certify that this patient is under my care and that I had  a face-to-face encounter that meets the physician face-to-face encounter requirements with this FUQUAN, WILSON. ([id number]) patient on this date. The encounter with the patient was in whole or in part for the following MEDICAL CONDITION: (primary reason for [place]) MEDICAL NECESSITY: I certify, that based on my findings, NURSING services are a medically necessary home health service. HOME BOUND STATUS: I certify that my clinical findings support that this patient is homebound (i.e., Due to illness or injury, pt requires aid of supportive devices such as crutches, cane, wheelchairs, walkers, the use of special transportation or the assistance of another person to leave their place of residence. There is a normal inability to leave the home and doing so requires considerable and taxing effort. Other absences are for medical reasons / religious services and are infrequent or of short duration when for other reasons). o If current dressing causes regression in wound condition, may D/C ordered dressing product/s and apply Normal Saline Moist Dressing daily until next [place] / Other MD appointment. [place] of regression in wound condition at [phone]. o Please direct any NON-WOUND related issues/requests for orders to patient's Primary Care Physician Wound #8 Left,Medial Metatarsal head first o [place] [facility] o Home Health Nurse may visit PRN to address patientos wound care needs. o FACE TO FACE ENCOUNTER: MEDICARE and MEDICAID PATIENTS: I certify that this patient is under my care and that I had a face-to-face encounter that meets the physician face-to-face encounter requirements with this patient on this date. The encounter with the patient was in whole or in part for the following MEDICAL CONDITION: (primary reason for [place])  MEDICAL NECESSITY: I certify, that based on my findings, NURSING services are a medically necessary home health service. HOME BOUND STATUS: I certify that my clinical findings support that this patient is homebound (i.e., Due to illness or injury, pt requires aid of supportive devices such as crutches, cane, wheelchairs, walkers, the use of special transportation or the assistance of another person to leave their place of residence. There is a normal inability to leave the home and doing so requires considerable and taxing effort. Other absences are for medical reasons / religious services and are infrequent or of short duration when for other reasons). o If current dressing causes regression in wound condition, may D/C ordered dressing product/s and apply Normal Saline Moist Dressing daily until next [place] / Other MD appointment. [place] of regression in wound condition at [phone]. o Please direct any NON-WOUND related issues/requests for orders to patient's Primary Care Physician Medications-please add to medication list. Wound #6 Right Toe Great o P.O. Antibiotics - Finish antibiotic Wound #7 Left Toe Great o P.O. Antibiotics - Finish antibiotic Wound #8 Left,Medial Metatarsal head first o P.O. Antibiotics - Finish antibiotic Services and Therapies o Arterial Studies- Bilateral Patient Medications Allergies: niacin, morphine, Iodinated Contrast- Oral and IV Dye Notifications Medication Indication Start End Keflex 05/26/2018 DOSE 1 - oral 500 mg capsule - 1 capsule oral taken 4 times a day for 7 days Electronic Signature(s) Signed: 05/26/2018 4:52:28 PM By: Montey Hora Signed: 05/28/2018 7:15:48 AM By: Barnet Pall, [place] T. ([id number]) Previous Signature: 05/26/2018 11:52:11 AM Version By: Worthy Keeler PA-C Entered By: Montey Hora on 05/26/2018 12:54:52 Amacher, Gavin Pound  ([id number]) -------------------------------------------------------------------------------- Problem List Details Patient Name: KEETON, KASSEBAUM T. Date of Service: 05/26/2018 10:45 AM Medical Record Number: [id number] Patient Account Number: [id number] Date of  Birth/Sex: 1931/05/18 (85 y.o. M) Treating RN: Montey Hora Primary Care Provider: Webb Silversmith Other Clinician: Referring Provider: Webb Silversmith Treating Provider/Extender: Melburn Hake, HOYT Weeks in Treatment: 1 Active Problems ICD-10 Evaluated Encounter Code Description Active Date Today Diagnosis I89.0 Lymphedema, not elsewhere classified 05/18/2018 No Yes E11.622 Type 2 diabetes mellitus with other skin ulcer 05/18/2018 No Yes 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 05/26/2018 No Yes specified severity L97.518 Non-pressure chronic ulcer of other part of right foot with 05/26/2018 No Yes other specified severity L03.116 Cellulitis of left lower limb 05/18/2018 No Yes Z79.01 Long term (current) use of anticoagulants 05/18/2018 No Yes I50.42 Chronic combined systolic (congestive) and diastolic 06/15/91 No Yes (congestive) heart failure J44.9 Chronic obstructive pulmonary disease, unspecified 05/18/2018 No Yes Inactive Problems Resolved Problems ICD-10 Code Description Active Date Resolved Date BOW, BUNTYN ([id number]) L97.818 Non-pressure chronic ulcer of other part of right lower leg with other 05/18/2018 05/20/2018 specified severity Electronic Signature(s) Signed: 05/28/2018 7:15:48 AM By: Worthy Keeler PA-C Entered By: Worthy Keeler on 05/26/2018 13:46:13 Karn, Gavin Pound ([id number]) -------------------------------------------------------------------------------- Progress Note Details Patient Name: [name] Date of Service: 05/26/2018 10:45 AM Medical Record Number: [id number] Patient Account Number: [id number] Date of Birth/Sex: 03-29-1932 (85 y.o. M) Treating RN: Montey Hora Primary Care Provider: Webb Silversmith Other Clinician: Referring Provider: Webb Silversmith Treating Provider/Extender: Melburn Hake, HOYT Weeks in Treatment: 1 Subjective Chief Complaint Information obtained from Patient Left lateral LE ulcer History of Present Illness (HPI) The following HPI elements were documented for the patient's wound: Associated Signs and Symptoms: Patient has a history of lymphedema, type II diabetes mellitus, long-term use of Eliquis, congestive heart failure, and COPD. 12/06/17 on evaluation today patient is here for evaluation of an ulcer on the left lateral lower extremity which has been present for roughly 2-3 months. Currently he has been using silver alginate and it was considered putting the patient in an [employer] wrap. However he did have arterial studies which actually showed to be fairly normal on the right however on the left he did have an ABI of 0.75. With that being said this ABI was consistent with at least mild-to-moderate arterial disease. The patient actually has some question on the test as to whether or not this result was actually even accurate his arterial disease could be more significant. For that reason I think he may need to see a vascular specialist to see about possibly undergoing an angiogram to see if arterial flow could be improved. With that being said it does appear that the patient rather desperately needs compression therapy he has significant edema of the bilateral lower extremities noted during evaluation today. No fevers, chills, nausea, or vomiting noted at this time. 12/16/17 on evaluation today patient actually appears to be doing better in regard to his lower extremity ulcers of the left lower extremity. Both wounds are significantly smaller and improved compared to the initial evaluation last week. Overall I'm very pleased with the progress at this time. The patient likewise is pleased with how things appear. He states that  he has thought about it and really does not want to have an angiogram that would prefer not to go see the vascular specialist which we had recommended last week. He states when he had this previous on top of the fact that he had issues with getting the groin area to stop bleeding he states that the die also seemed to cause issues in fact he  states through him into having another heart attack. Obviously he has good reasons to want to try to avoid this which I completely understand. 01/02/18 on evaluation today patient actually appears to be showing signs of good improvement which is excellent news. He has been tolerating the dressing changes without complication. Unfortunately he was in the hospital due to an infection of his left forearm he was on IV antibiotics for six days. The good news is he seems to be doing much better in that regard his left lower extremity also is doing better at this point. There does not appear to be any evidence of infection at this time which is great news. Overall I'm very pleased with the progress he seems to be making. 01/30/18 patient has not been seen our office actually since 23 September. At that point following he apparently healed but then had several areas that we opened shortly following. He has new areas in three different spots on evaluation today. This is on the left lower extremity were the ones previously were. With that being said there does not appear to be any evidence of infection at this time which is good news. No fevers, chills, nausea, or vomiting noted at this time. 02/10/18 on evaluation today patient actually appears to be doing excellent in regard to his lower extremities. He just has one wound that appears to still be open at this time. There does not appear to be any evidence of infection currently. With that being said he has been tolerating the compression that we been utilizing without complication. No fevers, chills, nausea, or vomiting  noted at this time. 02/24/18 on evaluation today patient's lower extremity ulcer actually shows signs of good improvement at this time. Fortunately there is no evidence of infection. Overall he has some pain but nothing too significant. KEDAR, SEDANO ([id number]) 03/17/18 on evaluation today patient appears to be doing better in regard to the ulcer on his left lower extremity. This is roughly half the size it was when I saw him last at least. Maybe more. Nonetheless overall he seems to be doing excellent with the Current wound care measures. 03/31/18 on evaluation today patient actually appears to be doing very well in regard to his lower extremity ulcer. He does have two areas that are leaking on the anterior portion of his lower extremity but no true ulcerations. The ulcer on the lateral portion of his lower extremity actually is doing better although it still is a little bit open at this point. It has a good granulation surface. Readmission 05/18/2018 85 year old male with history of lymphedema, type II diabetes mellitus, long-term use of Eliquis, congestive heart failure, and COPD. He was last seen at wound center December 2019. Presents today with bilateral wounds to his great toes and a wound to the left fifth metatarsal head area. Increased erythema and edema of the left lower leg. Unable to obtain ABIs. He has a small pin tip size area that is draining fluid. He did mention that there has been an increase in his weight up 185 pounds. He was instructed by his cardiologist to take his prescribed metolazone medication with elevated weight parameters >180 pounds. Inquired how often he has had to take metolazone for weight gain and he stated only once over the last couple of days. Encouraged him to follow-up with his cardiologist for heart failure management and weight fluctuation. VSS. Denies recent fever, chills, dizziness, or SOB. No acute distress during exam today. 05/26/18 patient  presents today  for reevaluation concerning ongoing issues with his right great toe as well as left great toe and medial first metatarsal region. He has been tolerating the dressing changes without complication. Fortunately there's no signs of infection systemically at this point. No fevers, chills, nausea, or vomiting noted at this time. Unfortunately he still has some erythema noted he did complete the Keflex for the initial seven days although I think he may need additional treatment to make sure this fully clears away. He still has not gotten the x-ray that we sent them for last week. He never obtain the arterial studies previous either he actually healed and then subsequently states that they never called him. Patient History Information obtained from Patient. Family History Diabetes - Maternal Grandparents, Heart Disease - Paternal Grandparents, Hypertension - Paternal Grandparents, Lung Disease - Mother, No family history of Cancer, Hereditary Spherocytosis, Kidney Disease, Seizures, Stroke, Thyroid Problems, Tuberculosis. Social History Former smoker - quit in 1993, Marital Status - Married, Alcohol Use - Never, Drug Use - No History, Caffeine Use - Daily. Medical History Eyes Denies history of Cataracts, Glaucoma, Optic Neuritis Ear/Nose/Mouth/Throat Denies history of Chronic sinus problems/congestion, Middle ear problems Hematologic/Lymphatic Denies history of Anemia, Hemophilia, Human Immunodeficiency Virus, Lymphedema, Sickle Cell Disease Respiratory Patient has history of Chronic Obstructive Pulmonary Disease (COPD), Sleep Apnea - CPAP Denies history of Aspiration, Asthma, Pneumothorax, Tuberculosis Cardiovascular Patient has history of Arrhythmia - a fib, Congestive Heart Failure, Hypertension, Myocardial Infarction - 1997 Denies history of Angina, Coronary Artery Disease, Deep Vein Thrombosis, Hypotension, Peripheral Arterial Disease, Peripheral Venous Disease, Phlebitis,  Vasculitis Gastrointestinal Denies history of Cirrhosis , Colitis, Crohn s, Hepatitis A, Hepatitis B, Hepatitis C Endocrine Patient has history of Type II Diabetes Denies history of Type I Diabetes Genitourinary Denies history of End Stage Renal Disease Larmer, Takota T. ([id number]) Immunological Denies history of Lupus Erythematosus, Raynaud s, Scleroderma Integumentary (Skin) Denies history of History of Burn, History of pressure wounds Musculoskeletal Patient has history of Osteoarthritis Denies history of Gout, Rheumatoid Arthritis, Osteomyelitis Neurologic Patient has history of Neuropathy Denies history of Dementia, Quadriplegia, Paraplegia, Seizure Disorder Oncologic Patient has history of Received Radiation - 5 treatments in chest Denies history of Received Chemotherapy Psychiatric Denies history of Anorexia/bulimia, Confinement Anxiety Medical And Surgical History Notes Neurologic CVA in 1999 Review of Systems (ROS) Constitutional Symptoms (General Health) Denies complaints or symptoms of Fever, Chills. Respiratory The patient has no complaints or symptoms. Cardiovascular Complains or has symptoms of LE edema. Psychiatric The patient has no complaints or symptoms. Objective Constitutional Well-nourished and well-hydrated in no acute distress. Vitals Time Taken: 10:46 AM, Height: 66 in, Weight: 180 lbs, BMI: 29, Temperature: 97.9 F, Pulse: 68 bpm, Respiratory Rate: 16 breaths/min, Blood Pressure: 130/58 mmHg. Respiratory normal breathing without difficulty. clear to auscultation bilaterally. Cardiovascular regular rate and rhythm with normal S1, S2. Absent posterior tibial and dorsalis pedis pulses bilateral lower extremities. 3+ pitting edema of the bilateral lower extremities. Psychiatric this patient is able to make decisions and demonstrates good insight into disease process. Alert and Oriented x 3. pleasant and cooperative. General Notes: Patient's  wound bed currently shows signs of erythema around the edge of the wound at this point. YEHOSHUA, VITELLI ([id number]) Fortunately there is no sign of worsening infection and in fact they tell me that his leg actually appears to be less red and inflamed compared to last week. With that being said there's really nothing for me to culture therefore since he seems to have made some progress  with the Keflex I'm leaning towards going ahead and refilling the Keflex for him at this time. I did perform light debridement around the left first metatarsal region to clear away some of the dead skin that was overlying the surface of the wound no bleeding occurred with this debridement. Integumentary (Hair, Skin) Wound #6 status is Open. Original cause of wound was Gradually Appeared. The wound is located on the Right Toe Great. The wound measures 0.5cm length x 0.5cm width x 0.1cm depth; 0.196cm^2 area and 0.02cm^3 volume. The wound is limited to skin breakdown. There is no tunneling or undermining noted. There is a none present amount of drainage noted. The wound margin is flat and intact. There is no granulation within the wound bed. There is a large (67-100%) amount of necrotic tissue within the wound bed including Eschar. The periwound skin appearance exhibited: Dry/Scaly. The periwound skin appearance did not exhibit: Callus, Crepitus, Excoriation, Induration, Rash, Scarring, Maceration, Atrophie Blanche, Cyanosis, Ecchymosis, Hemosiderin Staining, Mottled, Pallor, Rubor, Erythema. Periwound temperature was noted as No Abnormality. The periwound has tenderness on palpation. Wound #7 status is Open. Original cause of wound was Gradually Appeared. The wound is located on the Left Toe Great. The wound measures 1cm length x 1cm width x 0.1cm depth; 0.785cm^2 area and 0.079cm^3 volume. The wound is limited to skin breakdown. There is no tunneling or undermining noted. There is a none present amount of drainage  noted. The wound margin is flat and intact. There is no granulation within the wound bed. There is a large (67-100%) amount of necrotic tissue within the wound bed including Eschar. The periwound skin appearance exhibited: Cyanosis, Erythema. The periwound skin appearance did not exhibit: Callus, Crepitus, Excoriation, Induration, Rash, Scarring, Dry/Scaly, Maceration, Atrophie Blanche, Ecchymosis, Hemosiderin Staining, Mottled, Pallor, Rubor. The surrounding wound skin color is noted with erythema. Periwound temperature was noted as No Abnormality. The periwound has tenderness on palpation. Wound #8 status is Open. Original cause of wound was Gradually Appeared. The wound is located on the Left,Medial Metatarsal head first. The wound measures 1.4cm length x 1.2cm width x 0.1cm depth; 1.319cm^2 area and 0.132cm^3 volume. The wound is limited to skin breakdown. There is no tunneling or undermining noted. There is a none present amount of drainage noted. The wound margin is flat and intact. There is no granulation within the wound bed. There is a large (67- 100%) amount of necrotic tissue within the wound bed including Eschar. The periwound skin appearance exhibited: Dry/Scaly, Cyanosis, Erythema. The periwound skin appearance did not exhibit: Callus, Crepitus, Excoriation, Induration, Rash, Scarring, Maceration, Atrophie Blanche, Ecchymosis, Hemosiderin Staining, Mottled, Pallor, Rubor. The surrounding wound skin color is noted with erythema. Periwound temperature was noted as No Abnormality. The periwound has tenderness on palpation. 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 There was a Selective/Open Wound Skin/Epidermis Debridement with a total area of 1.68 sq cm performed by STONE III, HOYT E., PA-C. With the following instrument(s):  Curette to remove Non-Viable tissue/material. Material removed includes Skin: Dermis and Skin: Epidermis and after achieving pain control using Lidocaine 4% Topical Solution. No specimens were taken. A time out was conducted at 11:38, prior to the start of the procedure. There was no bleeding. The procedure was tolerated well with a pain level of 0 throughout and a pain level of 0 following the procedure. Post Debridement Measurements: 1.4cm length x 1.2cm width x 0.1cm depth; 0.132cm^3 volume. Character of Wound/Ulcer Post Debridement is improved. Severity of Tissue Post Debridement is: Fat layer exposed. Post procedure Diagnosis Wound #8: Same as Pre-Procedure Plan Wound Cleansing: Wound #6 Right Toe Great: Cleanse wound with mild soap and water Wound #7 Left Toe Great: Cleanse wound with mild soap and water Wound #8 Left,Medial Metatarsal head first: Cleanse wound with mild soap and water Primary Wound Dressing: Wound #6 Right Toe Great: Alginate - over betadine Other: - betadine paint Wound #7 Left Toe Great: Alginate - over betadine Other: - betadine paint Wound #8 Left,Medial Metatarsal head first: Alginate - over betadine Other: - betadine paint Secondary Dressing: Wound #6 Right Toe Great: Dry Gauze Conform/Kerlix - secure with tape and netting Wound #7 Left Toe Great: Dry Gauze Conform/Kerlix - secure with tape and netting Wound #8  Left,Medial Metatarsal head first: Dry Gauze Conform/Kerlix - secure with tape and netting Dressing Change Frequency: Wound #6 Right Toe Great: Change dressing every other day. Wound #7 Left Toe Great: Change dressing every other day. Wound #8 Left,Medial Metatarsal head first: Change dressing every other day. Follow-up Appointments: Wound #6 Right Toe Great: Return Appointment in 1 week. Wound #7 Left Toe Great: Return Appointment in 1 week. SANDRA, TELLEFSEN ([id number]) Wound #8 Left,Medial Metatarsal head first: Return Appointment in 1 week. Off-Loading: Wound #7 Left Toe Great: Open toe surgical shoe to: - Both feet Wound #8 Left,Medial Metatarsal head first: Open toe surgical shoe to: - Both feet Home Health: Wound #6 Right Toe Great: [facility]eshawn Lee Nurse may visit PRN to address patient s wound care needs. FACE TO FACE ENCOUNTER: MEDICARE and MEDICAID PATIENTS: I certify that this patient is under my care and that I had a face-to-face encounter that meets the physician face-to-face encounter requirements with this patient on this date. The encounter with the patient was in whole or in part for the following MEDICAL CONDITION: (primary reason for [place]) MEDICAL NECESSITY: I certify, that based on my findings, NURSING services are a medically necessary home health service. HOME BOUND STATUS: I certify that my clinical findings support that this patient is homebound (i.e., Due to illness or injury, pt requires aid of supportive devices such as crutches, cane, wheelchairs, walkers, the use of special transportation or the assistance of another person to leave their place of residence. There is a normal inability to leave the home and doing so requires considerable and taxing effort. Other absences are for medical reasons / religious services and are infrequent or of short duration when for other reasons). If current dressing causes  regression in wound condition, may D/C ordered dressing product/s and apply Normal Saline Moist Dressing daily until next [place] / Other MD appointment. [place] of regression in wound condition at [phone]. Please direct any NON-WOUND related issues/requests for orders to patient's Primary Care Physician Wound #7 Left Toe Great: [place] Nurse may visit PRN to address patient s wound care needs. FACE TO  FACE ENCOUNTER: MEDICARE and MEDICAID PATIENTS: I certify that this patient is under my care and that I had a face-to-face encounter that meets the physician face-to-face encounter requirements with this patient on this date. The encounter with the patient was in whole or in part for the following MEDICAL CONDITION: (primary reason for [place]) MEDICAL NECESSITY: I certify, that based on my findings, NURSING services are a medically necessary home health service. HOME BOUND STATUS: I certify that my clinical findings support that this patient is homebound (i.e., Due to illness or injury, pt requires aid of supportive devices such as crutches, cane, wheelchairs, walkers, the use of special transportation or the assistance of another person to leave their place of residence. There is a normal inability to leave the home and doing so requires considerable and taxing effort. Other absences are for medical reasons / religious services and are infrequent or of short duration when for other reasons). If current dressing causes regression in wound condition, may D/C ordered dressing product/s and apply Normal Saline Moist Dressing daily until next [place] / Other MD appointment. [place] of regression in wound condition at [phone]. Please direct any NON-WOUND related issues/requests for orders to patient's Primary Care Physician Wound #8 Left,Medial Metatarsal head first: [place] Nurse may visit PRN to address patient s wound care needs. FACE TO FACE ENCOUNTER: MEDICARE and MEDICAID PATIENTS: I certify that this patient is under my care and that I had a face-to-face encounter that meets the physician face-to-face encounter requirements with this patient on this date. The encounter with the patient was in whole or in part for the following MEDICAL CONDITION: (primary reason for [place]) MEDICAL NECESSITY: I certify, that based on my findings, NURSING services are a medically necessary home health service. HOME BOUND STATUS: I certify that my clinical findings support that this patient is homebound (i.e., Due to illness or injury, pt requires aid of supportive devices such as crutches, cane, wheelchairs, walkers, the use of special transportation or the assistance of another person to leave their place of residence. There is a normal inability to leave the home and doing so requires considerable and taxing effort. Other absences are for medical reasons / religious services and are infrequent or of short duration when for other reasons). If current dressing causes regression in wound condition, may D/C ordered dressing product/s and apply Normal Saline Moist Dressing daily until next [place] / Other MD appointment. [place] of regression in wound condition at [phone]. Please direct any NON-WOUND related issues/requests for orders to patient's Primary Care Physician Medications-please add to medication list.: Wound #6 Right Toe Great: P.O. Antibiotics - Finish antibiotic Wound #7 Left Toe GreatSERVANDO, KYLLONEN T. ([id number]) P.O. Antibiotics - Finish antibiotic Wound #8 Left,Medial Metatarsal head first: P.O. Antibiotics - Finish antibiotic Services and Therapies ordered were: Arterial Studies- Bilateral The following medication(s) was prescribed: Keflex oral 500 mg capsule 1 1  capsule oral taken 4 times a day for 7 days starting 05/26/2018 My suggestion at this point is gonna be that we go ahead and initiate the above wound care measures I am gonna send in a prescription for Keflex today. Patient is in agreement with this plan. Subsequently we will see were things stand at follow-up in one weeks time. He's gonna go for the x-ray later today which is a good idea I do believe. Subsequently we're also going to see  about the referral to vascular for arterial studies I know previous when we ordered this to resume issue with staffing hopefully will be able to get a minute this point however so that we can confirm and make sure that we have the right things going for him I think he likely needs compression with that being said I'm not willing to go forward with that until I can confirm his arterial status. We will see were things stand next week. Please see above for specific wound care orders. We will see patient for re-evaluation in 1 week(s) here in the clinic. If anything worsens or changes patient will contact our office for additional recommendations. Electronic Signature(s) Signed: 05/28/2018 7:15:48 AM By: Worthy Keeler PA-C Entered By: Worthy Keeler on 05/26/2018 13:46:26 [place], [place] ([id number]) -------------------------------------------------------------------------------- ROS/PFSH Details Patient Name: [name] Date of Service: 05/26/2018 10:45 AM Medical Record Number: [id number] Patient Account Number: [id number] Date of Birth/Sex: Apr 17, 1931 (85 y.o. M) Treating RN: Montey Hora Primary Care Provider: Webb Silversmith Other Clinician: Referring Provider: Webb Silversmith Treating Provider/Extender: Melburn Hake, HOYT Weeks in Treatment: 1 Information Obtained From Patient Wound History Do you currently have one or more open woundso Yes How many open wounds do you currently haveo 3 Approximately how long have you had your woundso 1 week Has  your wound(s) ever healed and then re-openedo No Have you had any lab work done in the past montho No Have you tested positive for an antibiotic resistant organism (MRSA, VRE)o No Have you tested positive for osteomyelitis (bone infection)o No Have you had any tests for circulation on your legso No Have you had other problems associated with your woundso Swelling Constitutional Symptoms (General Health) Complaints and Symptoms: Negative for: Fever; Chills Cardiovascular Complaints and Symptoms: Positive for: LE edema Medical History: Positive for: Arrhythmia - a fib; Congestive Heart Failure; Hypertension; Myocardial Infarction - 1997 Negative for: Angina; Coronary Artery Disease; Deep Vein Thrombosis; Hypotension; Peripheral Arterial Disease; Peripheral Venous Disease; Phlebitis; Vasculitis Eyes Medical History: Negative for: Cataracts; Glaucoma; Optic Neuritis Ear/Nose/Mouth/Throat Medical History: Negative for: Chronic sinus problems/congestion; Middle ear problems Hematologic/Lymphatic Medical History: Negative for: Anemia; Hemophilia; Human Immunodeficiency Virus; Lymphedema; Sickle Cell Disease Respiratory Complaints and Symptoms: No Complaints or Symptoms Medical HistoryVON, QUINTANAR ([id number]) Positive for: Chronic Obstructive Pulmonary Disease (COPD); Sleep Apnea - CPAP Negative for: Aspiration; Asthma; Pneumothorax; Tuberculosis Gastrointestinal Medical History: Negative for: Cirrhosis ; Colitis; Crohnos; Hepatitis A; Hepatitis B; Hepatitis C Endocrine Medical History: Positive for: Type II Diabetes Negative for: Type I Diabetes Treated with: Insulin, Oral agents Blood sugar tested every day: Yes Tested : QD Genitourinary Medical History: Negative for: End Stage Renal Disease Immunological Medical History: Negative for: Lupus Erythematosus; Raynaudos; Scleroderma Integumentary (Skin) Medical History: Negative for: History of Burn; History of  pressure wounds Musculoskeletal Medical History: Positive for: Osteoarthritis Negative for: Gout; Rheumatoid Arthritis; Osteomyelitis Neurologic Medical History: Positive for: Neuropathy Negative for: Dementia; Quadriplegia; Paraplegia; Seizure Disorder Past Medical History Notes: CVA in [place] History: Positive for: Received Radiation - 5 treatments in chest Negative for: Received Chemotherapy Psychiatric Complaints and Symptoms: No Complaints or Symptoms Medical History: Negative for: Anorexia/bulimia; Confinement Anxiety ZANDER, INGHAM. ([id number]) Immunizations Pneumococcal Vaccine: Received Pneumococcal Vaccination: Yes Implantable Devices Family and Social History Cancer: No; Diabetes: Yes - Maternal Grandparents; Heart Disease: Yes - Paternal Grandparents; Hereditary Spherocytosis: No; Hypertension: Yes - Paternal Grandparents; Kidney Disease: No; Lung Disease: Yes - Mother; Seizures: No; Stroke: No; Thyroid Problems: No; Tuberculosis: No;  Former smoker - quit in 1993; Marital Status - Married; Alcohol Use: Never; Drug Use: No History; Caffeine Use: Daily; Financial Concerns: No; Food, Clothing or Shelter Needs: No; Support System Lacking: No; Transportation Concerns: No; Advanced Directives: No; Patient does not want information on Advanced Directives Physician Affirmation I have reviewed and agree with the above information. Electronic Signature(s) Signed: 05/26/2018 4:52:28 PM By: Montey Hora Signed: 05/28/2018 7:15:48 AM By: Worthy Keeler PA-C Entered By: Worthy Keeler on 05/26/2018 11:49:55 [place], Gavin Pound ([id number]) -------------------------------------------------------------------------------- SuperBill Details Patient Name: [name] Date of Service: 05/26/2018 Medical Record Number: [id number] Patient Account Number: [id number] Date of Birth/Sex: 1931/10/31 (85 y.o. M) Treating RN: Montey Hora Primary Care Provider:  Webb Silversmith Other Clinician: Referring Provider: Webb Silversmith Treating Provider/Extender: Melburn Hake, HOYT Weeks in Treatment: 1 Diagnosis Coding ICD-10 Codes Code Description I89.0 Lymphedema, not elsewhere classified E11.622 Type 2 diabetes mellitus with other skin ulcer 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 specified severity L97.518 Non-pressure chronic ulcer of other part of right foot with other specified severity L03.116 Cellulitis of left lower limb Z79.01 Long term (current) use of anticoagulants I50.42 Chronic combined systolic (congestive) and diastolic (congestive) heart failure J44.9 Chronic obstructive pulmonary disease, unspecified Facility Procedures CPT4 Code Description: [id number] - DEBRIDE WOUND 1ST 20 SQ CM OR < ICD-10 Diagnosis Description 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 Modifier: specified sever Quantity: 1 ity Physician Procedures CPT4 Code Description: 8381840 [id number] - WC PHYS LEVEL 4 - EST PT ICD-10 Diagnosis Description I89.0 Lymphedema, not elsewhere classified E11.622 Type 2 diabetes mellitus with other skin ulcer L97.528 Non-pressure chronic ulcer of other part of left foot  with other L97.518 Non-pressure chronic ulcer of other part of right foot with other Modifier: 25 specified severi specified sever Quantity: 1 ty ity CPT4 Code Description: [id number] - WC PHYS DEBR WO ANESTH 20 SQ CM ICD-10 Diagnosis Description 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

## 2018-05-29 NOTE — Progress Notes (Signed)
[name] ([id number]) Visit Report for 05/26/2018 Arrival Information Details Patient Name: [name], [name]. Date of Service: 05/26/2018 10:45 AM Medical Record Number: [id number] Patient Account Number: [id number] Date of Birth/Sex: [birth year] (85 y.o. M) Treating RN: Montey Hora Primary Care [name]: [name] Other Clinician: Referring [name]: [name] Treating [name]/Extender: [name], [name] in Treatment: 1 Visit Information History Since Last Visit Added or deleted any medications: No Patient Arrived: [name] Any new allergies or adverse reactions: No Arrival Time: 10:45 Had a fall or experienced change in No Accompanied By: son activities of daily living that may affect Transfer Assistance: None risk of falls: Patient Has Alerts: Yes Signs or symptoms of abuse/neglect since last visito No Patient Alerts: Patient on Blood Thinner Hospitalized since last visit: No Eliquis Implantable device outside of the clinic excluding No DMII cellular tissue based products placed in the center since last visit: Has Dressing in Place as Prescribed: Yes Pain Present Now: No Electronic Signature(s) Signed: 05/26/2018 4:08:56 PM By: Lorine Bears RCP, RRT, CHT Entered By: Becky Sax, Amado Nash on 05/26/2018 10:46:15 Sepulveda, [name] ([id number]) -------------------------------------------------------------------------------- Encounter Discharge Information Details Patient Name: Shawn Bark T. Date of Service: 05/26/2018 10:45 AM Medical Record Number: [id number] Patient Account Number: [id number] Date of Birth/Sex: [birth year] (85 y.o. M) Treating RN: Montey Hora Primary Care Avalynn Bowe: [name] Other Clinician: Referring Marshon Bangs: [name] Treating Ajah Vanhoose/Extender: [name], [name] in Treatment: 1 Encounter Discharge Information Items Post Procedure Vitals Discharge Condition: Stable Temperature (F):  97.9 Ambulatory Status: [name] Pulse (bpm): 68 Discharge Destination: Home Respiratory Rate (breaths/min): 16 Transportation: Private Auto Blood Pressure (mmHg): 130/58 Accompanied By: son Schedule Follow-up Appointment: Yes Clinical Summary of Care: Electronic Signature(s) Signed: 05/26/2018 4:52:28 PM By: Montey Hora Entered By: Montey Hora on 05/26/2018 11:43:45 [name], [name] ([id number]) -------------------------------------------------------------------------------- Lower Extremity Assessment Details Patient Name: Shawn Bark T. Date of Service: 05/26/2018 10:45 AM Medical Record Number: [id number] Patient Account Number: [id number] Date of Birth/Sex: 24-Mar-1932 (85 y.o. M) Treating RN: Army Melia Primary Care Elysa Womac: [name] Other Clinician: Referring Milliani Herrada: [name] Treating Mekhi Lascola/Extender: [name], [name] in Treatment: 1 Edema Assessment Assessed: [Left: No] [Right: No] Edema: [Left: Yes] [Right: No] Calf Left: Right: Point of Measurement: 30 cm From Medial Instep 38 cm cm Ankle Left: Right: Point of Measurement: 12 cm From Medial Instep 26 cm cm Vascular Assessment Pulses: Dorsalis Pedis Palpable: [Left:No] Doppler Audible: [Left:Yes] Posterior Tibial Extremity colors, hair growth, and conditions: Extremity Color: [Left:Red] Hair Growth on Extremity: [Left:No] Temperature of Extremity: [Left:Warm] Capillary Refill: [Left:< 3 seconds] Toe Nail Assessment Left: Right: Thick: Yes Discolored: Yes Deformed: Yes Improper Length and Hygiene: No Electronic Signature(s) Signed: 05/29/2018 8:46:05 AM By: Army Melia Entered By: Army Melia on 05/26/2018 11:12:13 [name], [name] ([id number]) -------------------------------------------------------------------------------- Multi Wound Chart Details Patient Name: Shawn Bark T. Date of Service: 05/26/2018 10:45 AM Medical Record Number: [id number] Patient Account Number:  [id number] Date of Birth/Sex: May 07, 1931 (85 y.o. M) Treating RN: Montey Hora Primary Care Kaylana Fenstermacher: [name] Other Clinician: Referring Ranya Fiddler: [name] Treating Athelene Hursey/Extender: STONE III, [name] in Treatment: 1 Vital Signs Height(in): 66 Pulse(bpm): 10 Weight(lbs): 180 Blood Pressure(mmHg): 130/58 Body Mass Index(BMI): 29 Temperature(F): 97.9 Respiratory Rate 16 (breaths/min): Photos: [6:No Photos] [7:No Photos] [8:No Photos] Wound Location: [6:Right Toe Great] [7:Left Toe Great] [8:Left Metatarsal head first - Medial] Wounding Event: [6:Gradually Appeared] [7:Gradually Appeared] [8:Gradually Appeared] 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] [7: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] Secondary Etiology: [6:Diabetic Wound/Ulcer of the  Lower Extremity] [7:Diabetic Wound/Ulcer of the Lower Extremity] [8:Diabetic Wound/Ulcer of the Lower Extremity] Comorbid History: [6:Chronic Obstructive Pulmonary Disease (COPD), Sleep Apnea, Arrhythmia, Congestive Heart Failure, Hypertension, Myocardial Infarction, Type II Diabetes, Osteoarthritis, Neuropathy, Received Radiation] [7:Chronic Obstructive Pulmonary  Disease (COPD), Sleep Apnea, Arrhythmia, Congestive Heart Failure, Hypertension, Myocardial Infarction, Type II Diabetes, Osteoarthritis, Neuropathy, Received Radiation] [8:Chronic Obstructive Pulmonary Disease (COPD), Sleep Apnea, Arrhythmia, Congestive  Heart Failure, Hypertension, Myocardial Infarction, Type II Diabetes, Osteoarthritis, Neuropathy, Received Radiation] Date Acquired: [6:05/03/2018] [7:05/03/2018] [8:05/03/2018] [name] of Treatment: [6:1] [7:1] [8:1] Wound Status: [6:Open] [7:Open] [8:Open] Pending Amputation on [6:Yes] [7:Yes] [8:Yes] Presentation: Measurements L x W x D [6:0.5x0.5x0.1] [7:1x1x0.1] [8:1.4x1.2x0.1] (cm) Area (cm) : [6:0.196] [7:0.785] [8:1.319] Volume (cm) : [6:0.02] [7:0.079] [8:0.132] % Reduction in  Area: [6:-24.80%] [7:51.00%] [8:0.00%] % Reduction in Volume: [6:-25.00%] [7:50.60%] [8:0.00%] Classification: [6:Unclassifiable] [7:Full Thickness Without Exposed Support Structures] [8:Full Thickness Without Exposed Support Structures] Exudate Amount: [6:None Present] [7:None Present] [8:None Present] Wound Margin: [6:Flat and Intact] [7:Flat and Intact] [8:Flat and Intact] Granulation Amount: [6:None Present (0%)] [7:None Present (0%)] [8:None Present (0%)] Necrotic Amount: [6:Large (67-100%)] [7:Large (67-100%)] [8:Large (67-100%)] Necrotic Tissue: [6:Eschar] [7:Eschar] [8:Eschar] Exposed Structures: [6:Fascia: No Fat Layer (Subcutaneous Tissue) Exposed: No Tendon: No] [7:Fascia: No Fat Layer (Subcutaneous Tissue) Exposed: No Tendon: No] [8:Fascia: No Fat Layer (Subcutaneous Tissue) Exposed: No Tendon: No] Muscle: No Muscle: No Muscle: No Joint: No Joint: No Joint: No Bone: No Bone: No Bone: No Limited to Skin Breakdown Limited to Skin Breakdown Limited to Skin Breakdown Epithelialization: None None None Periwound Skin Texture: Excoriation: No Excoriation: No Excoriation: No Induration: No Induration: No Induration: No Callus: No Callus: No Callus: No Crepitus: No Crepitus: No Crepitus: No Rash: No Rash: No Rash: No Scarring: No Scarring: No Scarring: No Periwound Skin Moisture: Dry/Scaly: Yes Maceration: No Dry/Scaly: Yes Maceration: No Dry/Scaly: No Maceration: No Periwound Skin Color: Atrophie Blanche: No Cyanosis: Yes Cyanosis: Yes Cyanosis: No Erythema: Yes Erythema: Yes Ecchymosis: No Atrophie Blanche: No Atrophie Blanche: No Erythema: No Ecchymosis: No Ecchymosis: No Hemosiderin Staining: No Hemosiderin Staining: No Hemosiderin Staining: No Mottled: No Mottled: No Mottled: No Pallor: No Pallor: No Pallor: No Rubor: No Rubor: No Rubor: No Temperature: No Abnormality No Abnormality No Abnormality Tenderness on Palpation: Yes Yes  Yes Wound Preparation: Ulcer Cleansing: Ulcer Cleansing: Ulcer Cleansing: Rinsed/Irrigated with Saline Rinsed/Irrigated with Saline Rinsed/Irrigated with Saline Topical Anesthetic Applied: Topical Anesthetic Applied: Topical Anesthetic Applied: Other: lidocaine 4% Other: lidocaine 4% Other: lidocaine 4% Treatment Notes Electronic Signature(s) Signed: 05/26/2018 4:52:28 PM By: Montey Hora Entered By: Montey Hora on 05/26/2018 11:31:20 [name] ([id number]) -------------------------------------------------------------------------------- [place] Details Patient Name: Shawn Bark T. Date of Service: 05/26/2018 10:45 AM Medical Record Number: [id number] Patient Account Number: [id number] Date of Birth/Sex: 1932-03-08 (85 y.o. M) Treating RN: Montey Hora Primary Care Tinaya Ceballos: [name] Other Clinician: Referring Malaak Stach: [name] Treating Aarushi Hemric/Extender: [name], [name] in Treatment: 1 Active Inactive Wound/Skin Impairment Nursing Diagnoses: Impaired tissue integrity Knowledge deficit related to ulceration/compromised skin integrity Goals: Ulcer/skin breakdown will have a volume reduction of 30% by week 4 Date Initiated: 05/18/2018 Target Resolution Date: 06/16/2018 Goal Status: Active Interventions: Assess patient/caregiver ability to obtain necessary supplies Assess patient/caregiver ability to perform ulcer/skin care regimen upon admission and as needed Assess ulceration(s) every visit Notes: Electronic Signature(s) Signed: 05/26/2018 4:52:28 PM By: Montey Hora Entered By: Montey Hora on 05/26/2018 11:31:13 Niedermeier, [name] ([id number]) -------------------------------------------------------------------------------- Pain Assessment Details Patient Name: Shawn Bark T. Date of Service: 05/26/2018 10:45  AM Medical Record Number: [id number] Patient Account Number: [id number] Date of Birth/Sex: 10-Sep-1931 (86 y.o.  M) Treating RN: Montey Hora Primary Care Gladies Sofranko: [name] Other Clinician: Referring Anaysha Andre: [name] Treating Coleton Woon/Extender: [name], [name] in Treatment: 1 Active Problems Location of Pain Severity and Description of Pain Patient Has Paino No Site Locations Pain Management and Medication Current Pain Management: Electronic Signature(s) Signed: 05/26/2018 4:08:56 PM By: Paulla Fore, RRT, CHT Signed: 05/26/2018 4:52:28 PM By: Montey Hora Entered By: Lorine Bears on 05/26/2018 10:46:25 [name] ([id number]) -------------------------------------------------------------------------------- Patient/Caregiver Education Details Patient Name: Shawn Bark T. Date of Service: 05/26/2018 10:45 AM Medical Record Number: [id number] Patient Account Number: [id number] Date of Birth/Gender: [birth year] (85 y.o. M) Treating RN: Montey Hora Primary Care Physician: [name] Other Clinician: Referring Physician: Webb [name] Treating Physician/Extender: Sharalyn Ink in Treatment: 1 Education Assessment Education Provided To: Patient and Caregiver Education Topics Provided Wound/Skin Impairment: Handouts: Other: wound care as ordered Methods: Demonstration, Explain/Verbal Responses: State content correctly Electronic Signature(s) Signed: 05/26/2018 4:52:28 PM By: Montey Hora Entered By: Montey Hora on 05/26/2018 11:44:02 Lamprecht, [name] ([id number]) -------------------------------------------------------------------------------- Wound Assessment Details Patient Name: Shawn Bark T. Date of Service: 05/26/2018 10:45 AM Medical Record Number: [id number] Patient Account Number: [id number] Date of Birth/Sex: May 23, 1931 (85 y.o. M) Treating RN: Army Melia Primary Care Rhema Boyett: [name] Other Clinician: Referring Kourtney Montesinos: [name] Treating Ken Bonn/Extender: STONE III, [name] in  Treatment: 1 Wound Status Wound Number: 6 Primary Arterial Insufficiency Ulcer Etiology: Wound Location: Right Toe Great Secondary Diabetic Wound/Ulcer of the Lower Extremity Wounding Event: Gradually Appeared Etiology: Date Acquired: 05/03/2018 Wound Open [name] Of Treatment: 1 Status: Clustered Wound: No Comorbid Chronic Obstructive Pulmonary Disease Pending Amputation On Presentation History: (COPD), Sleep Apnea, Arrhythmia, Congestive Heart Failure, Hypertension, Myocardial Infarction, Type II Diabetes, Osteoarthritis, Neuropathy, Received Radiation Photos Photo Uploaded By: Gretta Cool, BSN, RN, CWS, Kim on 05/26/2018 16:01:52 Wound Measurements Length: (cm) 0.5 Width: (cm) 0.5 Depth: (cm) 0.1 Area: (cm) 0.196 Volume: (cm) 0.02 % Reduction in Area: -24.8% % Reduction in Volume: -25% Epithelialization: None Tunneling: No Undermining: No Wound Description Classification: Unclassifiable Foul Odor Wound Margin: Flat and Intact Slough/Fi Exudate Amount: None Present After Cleansing: No brino No Wound Bed Granulation Amount: None Present (0%) Exposed Structure Necrotic Amount: Large (67-100%) Fascia Exposed: No Necrotic Quality: Eschar Fat Layer (Subcutaneous Tissue) Exposed: No Tendon Exposed: No Muscle Exposed: No Joint Exposed: No Bone Exposed: No [name], Shawn T. ([id number]) Limited to Skin Breakdown Periwound Skin Texture Texture Color No Abnormalities Noted: No No Abnormalities Noted: No Callus: No Atrophie Blanche: No Crepitus: No Cyanosis: No Excoriation: No Ecchymosis: No Induration: No Erythema: No Rash: No Hemosiderin Staining: No Scarring: No Mottled: No Pallor: No Moisture Rubor: No No Abnormalities Noted: No Dry / Scaly: Yes Temperature / Pain Maceration: No Temperature: No Abnormality Tenderness on Palpation: Yes Wound Preparation Ulcer Cleansing: Rinsed/Irrigated with Saline Topical Anesthetic Applied: Other: lidocaine  4%, Treatment Notes Wound #6 (Right Toe Great) Notes Paint with betadine, plain alginate, conform, secured with netting Electronic Signature(s) Signed: 05/29/2018 8:46:05 AM By: Army Melia Entered By: Army Melia on 05/26/2018 11:10:17 Natt, [name] ([id number]) -------------------------------------------------------------------------------- Wound Assessment Details Patient Name: Shawn Bark T. Date of Service: 05/26/2018 10:45 AM Medical Record Number: [id number] Patient Account Number: [id number] Date of Birth/Sex: 1932/01/14 (85 y.o. M) Treating RN: Army Melia Primary Care Cori Henningsen: [name] Other Clinician: Referring Paige Vanderwoude: [name] Treating Letitia Sabala/Extender: STONE III, [name] in Treatment: 1 Wound Status  Wound Number: 7 Primary Arterial Insufficiency Ulcer Etiology: Wound Location: Left Toe Great Secondary Diabetic Wound/Ulcer of the Lower Extremity Wounding Event: Gradually Appeared Etiology: Date Acquired: 05/03/2018 Wound Open [name] Of Treatment: 1 Status: Clustered Wound: No Comorbid Chronic Obstructive Pulmonary Disease Pending Amputation On Presentation History: (COPD), Sleep Apnea, Arrhythmia, Congestive Heart Failure, Hypertension, Myocardial Infarction, Type II Diabetes, Osteoarthritis, Neuropathy, Received Radiation Photos Photo Uploaded By: Gretta Cool, BSN, RN, CWS, Kim on 05/26/2018 16:05:44 Wound Measurements Length: (cm) 1 Width: (cm) 1 Depth: (cm) 0.1 Area: (cm) 0.785 Volume: (cm) 0.079 % Reduction in Area: 51% % Reduction in Volume: 50.6% Epithelialization: None Tunneling: No Undermining: No Wound Description Full Thickness Without Exposed Support Foul Odor Classification: Structures Slough/Fi Wound Margin: Flat and Intact Exudate None Present Amount: After Cleansing: No brino No Wound Bed Granulation Amount: None Present (0%) Exposed Structure Necrotic Amount: Large (67-100%) Fascia Exposed: No Necrotic  Quality: Eschar Fat Layer (Subcutaneous Tissue) Exposed: No Tendon Exposed: No Muscle Exposed: No [name], Shawn T. ([id number]) Joint Exposed: No Bone Exposed: No Limited to Skin Breakdown Periwound Skin Texture Texture Color No Abnormalities Noted: No No Abnormalities Noted: No Callus: No Atrophie Blanche: No Crepitus: No Cyanosis: Yes Excoriation: No Ecchymosis: No Induration: No Erythema: Yes Rash: No Hemosiderin Staining: No Scarring: No Mottled: No Pallor: No Moisture Rubor: No No Abnormalities Noted: No Dry / Scaly: No Temperature / Pain Maceration: No Temperature: No Abnormality Tenderness on Palpation: Yes Wound Preparation Ulcer Cleansing: Rinsed/Irrigated with Saline Topical Anesthetic Applied: Other: lidocaine 4%, Treatment Notes Wound #7 (Left Toe Great) Notes Paint with betadine, plain alginate, conform, secured with netting Electronic Signature(s) Signed: 05/29/2018 8:46:05 AM By: Army Melia Entered By: Army Melia on 05/26/2018 11:10:32 Moffet, [name] ([id number]) -------------------------------------------------------------------------------- Wound Assessment Details Patient Name: Shawn Bark T. Date of Service: 05/26/2018 10:45 AM Medical Record Number: [id number] Patient Account Number: [id number] Date of Birth/Sex: [birth year] (85 y.o. M) Treating RN: Army Melia Primary Care Abdulloh Ullom: [name] Other Clinician: Referring Amaani Guilbault: [name] Treating Azaleah Usman/Extender: STONE III, [name] in Treatment: 1 Wound Status Wound Number: 8 Primary Arterial Insufficiency Ulcer Etiology: Wound Location: Left Metatarsal head first - Medial Secondary Diabetic Wound/Ulcer of the Lower Extremity Wounding Event: Gradually Appeared Etiology: Date Acquired: 05/03/2018 Wound Open [name] Of Treatment: 1 Status: Clustered Wound: No Comorbid Chronic Obstructive Pulmonary Disease Pending Amputation On Presentation History: (COPD), Sleep  Apnea, Arrhythmia, Congestive Heart Failure, Hypertension, Myocardial Infarction, Type II Diabetes, Osteoarthritis, Neuropathy, Received Radiation Photos Photo Uploaded By: Gretta Cool, BSN, RN, CWS, Kim on 05/26/2018 16:01:52 Wound Measurements Length: (cm) 1.4 Width: (cm) 1.2 Depth: (cm) 0.1 Area: (cm) 1.319 Volume: (cm) 0.132 % Reduction in Area: 0% % Reduction in Volume: 0% Epithelialization: None Tunneling: No Undermining: No Wound Description Full Thickness Without Exposed Support Classification: Structures Wound Margin: Flat and Intact Exudate None Present Amount: Foul Odor After Cleansing: No Slough/Fibrino No Wound Bed Granulation Amount: None Present (0%) Exposed Structure Necrotic Amount: Large (67-100%) Fascia Exposed: No Necrotic Quality: Eschar Fat Layer (Subcutaneous Tissue) Exposed: No Tendon Exposed: No Muscle Exposed: No [name], Shawn T. ([id number]) Joint Exposed: No Bone Exposed: No Limited to Skin Breakdown Periwound Skin Texture Texture Color No Abnormalities Noted: No No Abnormalities Noted: No Callus: No Atrophie Blanche: No Crepitus: No Cyanosis: Yes Excoriation: No Ecchymosis: No Induration: No Erythema: Yes Rash: No Hemosiderin Staining: No Scarring: No Mottled: No Pallor: No Moisture Rubor: No No Abnormalities Noted: No Dry / Scaly: Yes Temperature / Pain Maceration: No Temperature: No Abnormality Tenderness on Palpation: Yes  Wound Preparation Ulcer Cleansing: Rinsed/Irrigated with Saline Topical Anesthetic Applied: Other: lidocaine 4%, Treatment Notes Wound #8 (Left, Medial Metatarsal head first) Notes Paint with betadine, plain alginate, conform, secured with netting Electronic Signature(s) Signed: 05/29/2018 8:46:05 AM By: Army Melia Entered By: Army Melia on 05/26/2018 11:10:50 Signorelli, [name] ([id number]) -------------------------------------------------------------------------------- Vitals  Details Patient Name: Shawn Bark T. Date of Service: 05/26/2018 10:45 AM Medical Record Number: [id number] Patient Account Number: [id number] Date of Birth/Sex: 1932-04-02 (85 y.o. M) Treating RN: Montey Hora Primary Care Nataliyah Packham: [name] Other Clinician: Referring Malakhi Markwood: [name] Treating Shanyia Stines/Extender: STONE III, [name] in Treatment: 1 Vital Signs Time Taken: 10:46 Temperature (F): 97.9 Height (in): 66 Pulse (bpm): 68 Weight (lbs): 180 Respiratory Rate (breaths/min): 16 Body Mass Index (BMI): 29 Blood Pressure (mmHg): 130/58 Reference Range: 80 - 120 mg / dl Airway Electronic Signature(s) Signed: 05/26/2018 4:08:56 PM By: Lorine Bears RCP, RRT, CHT Entered By: Becky Sax, Amado Nash on 05/26/2018 10:49:23

## 2018-05-31 ENCOUNTER — Ambulatory Visit (INDEPENDENT_AMBULATORY_CARE_PROVIDER_SITE_OTHER): Payer: Medicare Other

## 2018-05-31 ENCOUNTER — Other Ambulatory Visit (INDEPENDENT_AMBULATORY_CARE_PROVIDER_SITE_OTHER): Payer: Self-pay | Admitting: Physician Assistant

## 2018-05-31 DIAGNOSIS — M79605 Pain in left leg: Secondary | ICD-10-CM

## 2018-06-02 ENCOUNTER — Ambulatory Visit: Payer: Medicare Other | Admitting: Physician Assistant

## 2018-06-05 ENCOUNTER — Encounter: Payer: Self-pay | Admitting: Internal Medicine

## 2018-06-05 DIAGNOSIS — M109 Gout, unspecified: Secondary | ICD-10-CM

## 2018-06-07 ENCOUNTER — Telehealth: Payer: Self-pay | Admitting: Cardiovascular Disease

## 2018-06-07 ENCOUNTER — Encounter: Payer: Self-pay | Admitting: Internal Medicine

## 2018-06-07 NOTE — Telephone Encounter (Signed)
Patient in the bathroom at the time of all. Spoke with his niece, Horris Latino. States he is not short of breath or having any issues at this time. She will have him call us if needed but at this time he is fine.

## 2018-06-07 NOTE — Telephone Encounter (Signed)
[name] from [facility] calling  Took metolazone last night and lost 7 pounds overnight Not complaining of any dehydration [facility] will be following up tomorrow  If there is anything we would want to do differently for patient, please call patient directly

## 2018-06-08 ENCOUNTER — Other Ambulatory Visit: Payer: Self-pay | Admitting: Internal Medicine

## 2018-06-08 MED ORDER — COLCHICINE 0.6 MG PO TABS: 0.6000 mg | ORAL_TABLET | Freq: Two times a day (BID) | ORAL | 0 refills | Status: DC | PRN

## 2018-06-09 ENCOUNTER — Encounter: Payer: Medicare Other | Admitting: Physician Assistant

## 2018-06-09 ENCOUNTER — Other Ambulatory Visit: Payer: Self-pay

## 2018-06-09 ENCOUNTER — Inpatient Hospital Stay: Payer: Medicare Other

## 2018-06-09 ENCOUNTER — Encounter: Payer: Self-pay | Admitting: Emergency Medicine

## 2018-06-09 ENCOUNTER — Inpatient Hospital Stay
Admission: EM | Admit: 2018-06-09 | Discharge: 2018-06-16 | DRG: 253 | Disposition: A | Discharge status: Skilled Nursing Facility | Payer: Medicare Other | Attending: Internal Medicine | Admitting: Internal Medicine

## 2018-06-09 ENCOUNTER — Emergency Department: Payer: Medicare Other

## 2018-06-09 DIAGNOSIS — Z79899 Other long term (current) drug therapy: Secondary | ICD-10-CM | POA: Diagnosis not present

## 2018-06-09 DIAGNOSIS — Z7952 Long term (current) use of systemic steroids: Secondary | ICD-10-CM

## 2018-06-09 DIAGNOSIS — Z89412 Acquired absence of left great toe: Secondary | ICD-10-CM | POA: Diagnosis not present

## 2018-06-09 DIAGNOSIS — Z85828 Personal history of other malignant neoplasm of skin: Secondary | ICD-10-CM

## 2018-06-09 DIAGNOSIS — E1140 Type 2 diabetes mellitus with diabetic neuropathy, unspecified: Secondary | ICD-10-CM | POA: Diagnosis present

## 2018-06-09 DIAGNOSIS — Z87891 Personal history of nicotine dependence: Secondary | ICD-10-CM

## 2018-06-09 DIAGNOSIS — E785 Hyperlipidemia, unspecified: Secondary | ICD-10-CM | POA: Diagnosis present

## 2018-06-09 DIAGNOSIS — I2510 Atherosclerotic heart disease of native coronary artery without angina pectoris: Secondary | ICD-10-CM | POA: Diagnosis present

## 2018-06-09 DIAGNOSIS — F419 Anxiety disorder, unspecified: Secondary | ICD-10-CM | POA: Diagnosis present

## 2018-06-09 DIAGNOSIS — I7092 Chronic total occlusion of artery of the extremities: Secondary | ICD-10-CM | POA: Diagnosis not present

## 2018-06-09 DIAGNOSIS — I5032 Chronic diastolic (congestive) heart failure: Secondary | ICD-10-CM | POA: Diagnosis present

## 2018-06-09 DIAGNOSIS — L03116 Cellulitis of left lower limb: Secondary | ICD-10-CM

## 2018-06-09 DIAGNOSIS — K219 Gastro-esophageal reflux disease without esophagitis: Secondary | ICD-10-CM | POA: Diagnosis present

## 2018-06-09 DIAGNOSIS — Z7901 Long term (current) use of anticoagulants: Secondary | ICD-10-CM

## 2018-06-09 DIAGNOSIS — L02612 Cutaneous abscess of left foot: Secondary | ICD-10-CM | POA: Diagnosis present

## 2018-06-09 DIAGNOSIS — Z91041 Radiographic dye allergy status: Secondary | ICD-10-CM

## 2018-06-09 DIAGNOSIS — Z833 Family history of diabetes mellitus: Secondary | ICD-10-CM

## 2018-06-09 DIAGNOSIS — Z79890 Hormone replacement therapy (postmenopausal): Secondary | ICD-10-CM

## 2018-06-09 DIAGNOSIS — Z8249 Family history of ischemic heart disease and other diseases of the circulatory system: Secondary | ICD-10-CM

## 2018-06-09 DIAGNOSIS — Z955 Presence of coronary angioplasty implant and graft: Secondary | ICD-10-CM | POA: Diagnosis not present

## 2018-06-09 DIAGNOSIS — Z888 Allergy status to other drugs, medicaments and biological substances status: Secondary | ICD-10-CM

## 2018-06-09 DIAGNOSIS — Z794 Long term (current) use of insulin: Secondary | ICD-10-CM

## 2018-06-09 DIAGNOSIS — I110 Hypertensive heart disease with heart failure: Secondary | ICD-10-CM | POA: Diagnosis not present

## 2018-06-09 DIAGNOSIS — I890 Lymphedema, not elsewhere classified: Secondary | ICD-10-CM | POA: Diagnosis present

## 2018-06-09 DIAGNOSIS — N184 Chronic kidney disease, stage 4 (severe): Secondary | ICD-10-CM | POA: Diagnosis present

## 2018-06-09 DIAGNOSIS — J449 Chronic obstructive pulmonary disease, unspecified: Secondary | ICD-10-CM | POA: Diagnosis present

## 2018-06-09 DIAGNOSIS — I482 Chronic atrial fibrillation, unspecified: Secondary | ICD-10-CM | POA: Diagnosis present

## 2018-06-09 DIAGNOSIS — L03115 Cellulitis of right lower limb: Secondary | ICD-10-CM | POA: Diagnosis present

## 2018-06-09 DIAGNOSIS — E11628 Type 2 diabetes mellitus with other skin complications: Secondary | ICD-10-CM | POA: Diagnosis not present

## 2018-06-09 DIAGNOSIS — L97528 Non-pressure chronic ulcer of other part of left foot with other specified severity: Secondary | ICD-10-CM | POA: Diagnosis not present

## 2018-06-09 DIAGNOSIS — B965 Pseudomonas (aeruginosa) (mallei) (pseudomallei) as the cause of diseases classified elsewhere: Secondary | ICD-10-CM | POA: Diagnosis not present

## 2018-06-09 DIAGNOSIS — I509 Heart failure, unspecified: Secondary | ICD-10-CM | POA: Diagnosis not present

## 2018-06-09 DIAGNOSIS — L089 Local infection of the skin and subcutaneous tissue, unspecified: Secondary | ICD-10-CM

## 2018-06-09 DIAGNOSIS — E1151 Type 2 diabetes mellitus with diabetic peripheral angiopathy without gangrene: Principal | ICD-10-CM | POA: Diagnosis present

## 2018-06-09 DIAGNOSIS — E1122 Type 2 diabetes mellitus with diabetic chronic kidney disease: Secondary | ICD-10-CM | POA: Diagnosis present

## 2018-06-09 DIAGNOSIS — Z9981 Dependence on supplemental oxygen: Secondary | ICD-10-CM

## 2018-06-09 DIAGNOSIS — E11621 Type 2 diabetes mellitus with foot ulcer: Secondary | ICD-10-CM | POA: Diagnosis present

## 2018-06-09 DIAGNOSIS — I70245 Atherosclerosis of native arteries of left leg with ulceration of other part of foot: Secondary | ICD-10-CM | POA: Diagnosis not present

## 2018-06-09 DIAGNOSIS — I130 Hypertensive heart and chronic kidney disease with heart failure and stage 1 through stage 4 chronic kidney disease, or unspecified chronic kidney disease: Secondary | ICD-10-CM | POA: Diagnosis present

## 2018-06-09 DIAGNOSIS — Z7951 Long term (current) use of inhaled steroids: Secondary | ICD-10-CM

## 2018-06-09 DIAGNOSIS — L97529 Non-pressure chronic ulcer of other part of left foot with unspecified severity: Secondary | ICD-10-CM | POA: Diagnosis present

## 2018-06-09 DIAGNOSIS — Z79891 Long term (current) use of opiate analgesic: Secondary | ICD-10-CM

## 2018-06-09 DIAGNOSIS — Z8673 Personal history of transient ischemic attack (TIA), and cerebral infarction without residual deficits: Secondary | ICD-10-CM

## 2018-06-09 DIAGNOSIS — M1A9XX1 Chronic gout, unspecified, with tophus (tophi): Secondary | ICD-10-CM | POA: Diagnosis present

## 2018-06-09 DIAGNOSIS — I272 Pulmonary hypertension, unspecified: Secondary | ICD-10-CM | POA: Diagnosis present

## 2018-06-09 DIAGNOSIS — Z885 Allergy status to narcotic agent status: Secondary | ICD-10-CM | POA: Diagnosis not present

## 2018-06-09 DIAGNOSIS — I252 Old myocardial infarction: Secondary | ICD-10-CM

## 2018-06-09 DIAGNOSIS — B9620 Unspecified Escherichia coli [E. coli] as the cause of diseases classified elsewhere: Secondary | ICD-10-CM | POA: Diagnosis not present

## 2018-06-09 DIAGNOSIS — Z95820 Peripheral vascular angioplasty status with implants and grafts: Secondary | ICD-10-CM | POA: Diagnosis not present

## 2018-06-09 DIAGNOSIS — I96 Gangrene, not elsewhere classified: Secondary | ICD-10-CM | POA: Diagnosis not present

## 2018-06-09 LAB — CBC WITH DIFFERENTIAL/PLATELET
ABS IMMATURE GRANULOCYTES: 0.15 10*3/uL — AB (ref 0.00–0.07)
Basophils Absolute: 0.1 10*3/uL (ref 0.0–0.1)
Basophils Relative: 0 %
Eosinophils Absolute: 0 10*3/uL (ref 0.0–0.5)
Eosinophils Relative: 0 %
HCT: 42.9 % (ref 39.0–52.0)
Hemoglobin: 13.6 g/dL (ref 13.0–17.0)
Immature Granulocytes: 1 %
Lymphocytes Relative: 11 %
Lymphs Abs: 2.3 10*3/uL (ref 0.7–4.0)
MCH: 29 pg (ref 26.0–34.0)
MCHC: 31.7 g/dL (ref 30.0–36.0)
MCV: 91.5 fL (ref 80.0–100.0)
MONO ABS: 1.6 10*3/uL — AB (ref 0.1–1.0)
Monocytes Relative: 8 %
Neutro Abs: 16.3 10*3/uL — ABNORMAL HIGH (ref 1.7–7.7)
Neutrophils Relative %: 80 %
Platelets: 277 10*3/uL (ref 150–400)
RBC: 4.69 MIL/uL (ref 4.22–5.81)
RDW: 16.9 % — ABNORMAL HIGH (ref 11.5–15.5)
WBC: 20.4 10*3/uL — ABNORMAL HIGH (ref 4.0–10.5)
nRBC: 0 % (ref 0.0–0.2)

## 2018-06-09 LAB — COMPREHENSIVE METABOLIC PANEL
ALT: 14 U/L (ref 0–44)
AST: 14 U/L — ABNORMAL LOW (ref 15–41)
Albumin: 3.3 g/dL — ABNORMAL LOW (ref 3.5–5.0)
Alkaline Phosphatase: 67 U/L (ref 38–126)
Anion gap: 11 (ref 5–15)
BILIRUBIN TOTAL: 0.7 mg/dL (ref 0.3–1.2)
BUN: 50 mg/dL — ABNORMAL HIGH (ref 8–23)
CO2: 29 mmol/L (ref 22–32)
Calcium: 9.4 mg/dL (ref 8.9–10.3)
Chloride: 100 mmol/L (ref 98–111)
Creatinine, Ser: 1.62 mg/dL — ABNORMAL HIGH (ref 0.61–1.24)
GFR calc Af Amer: 45 mL/min — ABNORMAL LOW (ref >60)
GFR calc non Af Amer: 38 mL/min — ABNORMAL LOW (ref >60)
Glucose, Bld: 115 mg/dL — ABNORMAL HIGH (ref 70–99)
Potassium: 4.1 mmol/L (ref 3.5–5.1)
Sodium: 140 mmol/L (ref 135–145)
Total Protein: 7.2 g/dL (ref 6.5–8.1)

## 2018-06-09 LAB — LACTIC ACID, PLASMA
Lactic Acid, Venous: 2.1 mmol/L (ref 0.5–1.9)
Lactic Acid, Venous: 2.8 mmol/L (ref 0.5–1.9)

## 2018-06-09 LAB — PROTIME-INR
INR: 1.2 (ref 0.8–1.2)
Prothrombin Time: 15 seconds (ref 11.4–15.2)

## 2018-06-09 LAB — GLUCOSE, CAPILLARY
GLUCOSE-CAPILLARY: 128 mg/dL — AB (ref 70–99)
Glucose-Capillary: 212 mg/dL — ABNORMAL HIGH (ref 70–99)

## 2018-06-09 LAB — APTT: aPTT: 33 seconds (ref 24–36)

## 2018-06-09 MED ORDER — UMECLIDINIUM BROMIDE 62.5 MCG/INH IN AEPB
1.0000 | INHALATION_SPRAY | Freq: Every day | RESPIRATORY_TRACT | Status: DC
  Administered 2018-06-10 – 2018-06-16 (×7): 1 via RESPIRATORY_TRACT
  Filled 2018-06-09: qty 7

## 2018-06-09 MED ORDER — TRAMADOL HCL 50 MG PO TABS
50.0000 mg | ORAL_TABLET | Freq: Two times a day (BID) | ORAL | Status: DC
  Administered 2018-06-09 – 2018-06-16 (×12): 50 mg via ORAL
  Filled 2018-06-09 (×12): qty 1

## 2018-06-09 MED ORDER — EZETIMIBE 10 MG PO TABS
10.0000 mg | ORAL_TABLET | Freq: Every day | ORAL | Status: DC
  Administered 2018-06-10 – 2018-06-16 (×6): 10 mg via ORAL
  Filled 2018-06-09 (×6): qty 1

## 2018-06-09 MED ORDER — METOPROLOL SUCCINATE ER 50 MG PO TB24
50.0000 mg | ORAL_TABLET | Freq: Two times a day (BID) | ORAL | Status: DC
  Administered 2018-06-09 – 2018-06-16 (×12): 50 mg via ORAL
  Filled 2018-06-09 (×13): qty 1

## 2018-06-09 MED ORDER — TRAZODONE HCL 100 MG PO TABS
100.0000 mg | ORAL_TABLET | Freq: Every evening | ORAL | Status: DC | PRN
  Administered 2018-06-09 – 2018-06-15 (×3): 100 mg via ORAL
  Filled 2018-06-09 (×3): qty 1

## 2018-06-09 MED ORDER — ATORVASTATIN CALCIUM 20 MG PO TABS
40.0000 mg | ORAL_TABLET | Freq: Every day | ORAL | Status: DC
  Administered 2018-06-10 – 2018-06-16 (×6): 40 mg via ORAL
  Filled 2018-06-09 (×6): qty 2

## 2018-06-09 MED ORDER — PIPERACILLIN-TAZOBACTAM 3.375 G IVPB
3.3750 g | Freq: Three times a day (TID) | INTRAVENOUS | Status: AC
  Administered 2018-06-09 – 2018-06-14 (×16): 3.375 g via INTRAVENOUS
  Filled 2018-06-09 (×15): qty 50

## 2018-06-09 MED ORDER — LATANOPROST 0.005 % OP SOLN
1.0000 [drp] | Freq: Every day | OPHTHALMIC | Status: DC
  Administered 2018-06-09 – 2018-06-15 (×7): 1 [drp] via OPHTHALMIC
  Filled 2018-06-09: qty 2.5

## 2018-06-09 MED ORDER — ACETAMINOPHEN 325 MG PO TABS
650.0000 mg | ORAL_TABLET | Freq: Four times a day (QID) | ORAL | Status: DC | PRN
  Administered 2018-06-11: 650 mg via ORAL
  Filled 2018-06-09: qty 2

## 2018-06-09 MED ORDER — ONDANSETRON HCL 4 MG/2ML IJ SOLN: 4.0000 mg | Freq: Four times a day (QID) | INTRAMUSCULAR | Status: DC | PRN

## 2018-06-09 MED ORDER — CITALOPRAM HYDROBROMIDE 20 MG PO TABS
10.0000 mg | ORAL_TABLET | Freq: Every day | ORAL | Status: DC
  Administered 2018-06-10 – 2018-06-16 (×6): 10 mg via ORAL
  Filled 2018-06-09 (×6): qty 1

## 2018-06-09 MED ORDER — SODIUM CHLORIDE 0.9 % IV SOLN
250.0000 mL | INTRAVENOUS | Status: DC | PRN
  Administered 2018-06-09: 250 mL via INTRAVENOUS

## 2018-06-09 MED ORDER — ACETAMINOPHEN 650 MG RE SUPP: 650.0000 mg | Freq: Four times a day (QID) | RECTAL | Status: DC | PRN

## 2018-06-09 MED ORDER — INSULIN ASPART 100 UNIT/ML ~~LOC~~ SOLN: 16.0000 [IU] | Freq: Three times a day (TID) | SUBCUTANEOUS | Status: DC

## 2018-06-09 MED ORDER — GABAPENTIN 100 MG PO CAPS
100.0000 mg | ORAL_CAPSULE | Freq: Three times a day (TID) | ORAL | Status: DC
  Administered 2018-06-09 – 2018-06-16 (×18): 100 mg via ORAL
  Filled 2018-06-09 (×19): qty 1

## 2018-06-09 MED ORDER — INSULIN DETEMIR 100 UNIT/ML ~~LOC~~ SOLN
42.0000 [IU] | Freq: Every day | SUBCUTANEOUS | Status: DC
  Administered 2018-06-09 – 2018-06-10 (×2): 42 [IU] via SUBCUTANEOUS
  Filled 2018-06-09 (×3): qty 0.42

## 2018-06-09 MED ORDER — TORSEMIDE 20 MG PO TABS
40.0000 mg | ORAL_TABLET | Freq: Two times a day (BID) | ORAL | Status: DC
  Administered 2018-06-10 – 2018-06-16 (×8): 40 mg via ORAL
  Filled 2018-06-09 (×12): qty 2

## 2018-06-09 MED ORDER — BISACODYL 5 MG PO TBEC
5.0000 mg | DELAYED_RELEASE_TABLET | Freq: Every day | ORAL | Status: DC | PRN
  Administered 2018-06-11: 5 mg via ORAL
  Filled 2018-06-09: qty 1

## 2018-06-09 MED ORDER — NITROGLYCERIN 0.4 MG SL SUBL: 0.4000 mg | SUBLINGUAL_TABLET | SUBLINGUAL | Status: DC | PRN

## 2018-06-09 MED ORDER — ONDANSETRON HCL 4 MG PO TABS: 4.0000 mg | ORAL_TABLET | Freq: Four times a day (QID) | ORAL | Status: DC | PRN

## 2018-06-09 MED ORDER — INSULIN ASPART 100 UNIT/ML ~~LOC~~ SOLN
0.0000 [IU] | Freq: Three times a day (TID) | SUBCUTANEOUS | Status: DC
  Administered 2018-06-10 (×2): 2 [IU] via SUBCUTANEOUS
  Administered 2018-06-11: 3 [IU] via SUBCUTANEOUS
  Administered 2018-06-11: 5 [IU] via SUBCUTANEOUS
  Administered 2018-06-12: 7 [IU] via SUBCUTANEOUS
  Administered 2018-06-13 (×2): 2 [IU] via SUBCUTANEOUS
  Administered 2018-06-14: 3 [IU] via SUBCUTANEOUS
  Administered 2018-06-14 – 2018-06-15 (×2): 2 [IU] via SUBCUTANEOUS
  Administered 2018-06-15: 3 [IU] via SUBCUTANEOUS
  Administered 2018-06-16: 2 [IU] via SUBCUTANEOUS
  Filled 2018-06-09 (×13): qty 1

## 2018-06-09 MED ORDER — METOLAZONE 5 MG PO TABS
5.0000 mg | ORAL_TABLET | Freq: Every day | ORAL | Status: DC | PRN
  Filled 2018-06-09: qty 1

## 2018-06-09 MED ORDER — COLCHICINE 0.6 MG PO TABS
0.6000 mg | ORAL_TABLET | Freq: Two times a day (BID) | ORAL | Status: DC | PRN
  Administered 2018-06-11: 0.6 mg via ORAL
  Filled 2018-06-09: qty 1

## 2018-06-09 MED ORDER — VANCOMYCIN HCL IN DEXTROSE 1-5 GM/200ML-% IV SOLN
1000.0000 mg | Freq: Once | INTRAVENOUS | Status: AC
  Administered 2018-06-09: 1000 mg via INTRAVENOUS
  Filled 2018-06-09: qty 200

## 2018-06-09 MED ORDER — VANCOMYCIN HCL IN DEXTROSE 1-5 GM/200ML-% IV SOLN: 1000.0000 mg | Freq: Once | INTRAVENOUS | Status: DC

## 2018-06-09 MED ORDER — FINASTERIDE 5 MG PO TABS
5.0000 mg | ORAL_TABLET | Freq: Every day | ORAL | Status: DC
  Administered 2018-06-10 – 2018-06-16 (×6): 5 mg via ORAL
  Filled 2018-06-09 (×6): qty 1

## 2018-06-09 MED ORDER — MONTELUKAST SODIUM 10 MG PO TABS
10.0000 mg | ORAL_TABLET | Freq: Every day | ORAL | Status: DC
  Administered 2018-06-09 – 2018-06-15 (×7): 10 mg via ORAL
  Filled 2018-06-09 (×7): qty 1

## 2018-06-09 MED ORDER — DORZOLAMIDE HCL 2 % OP SOLN
1.0000 [drp] | Freq: Three times a day (TID) | OPHTHALMIC | Status: DC
  Administered 2018-06-09 – 2018-06-16 (×19): 1 [drp] via OPHTHALMIC
  Filled 2018-06-09: qty 10

## 2018-06-09 MED ORDER — PIPERACILLIN-TAZOBACTAM 3.375 G IVPB: 3.3750 g | Freq: Two times a day (BID) | INTRAVENOUS | Status: DC

## 2018-06-09 MED ORDER — TAMSULOSIN HCL 0.4 MG PO CAPS
0.4000 mg | ORAL_CAPSULE | Freq: Every day | ORAL | Status: DC
  Administered 2018-06-10 – 2018-06-16 (×7): 0.4 mg via ORAL
  Filled 2018-06-09 (×6): qty 1

## 2018-06-09 MED ORDER — PREDNISONE 10 MG PO TABS
10.0000 mg | ORAL_TABLET | Freq: Every day | ORAL | Status: DC
  Administered 2018-06-10 – 2018-06-16 (×7): 10 mg via ORAL
  Filled 2018-06-09 (×7): qty 1

## 2018-06-09 MED ORDER — VANCOMYCIN HCL 10 G IV SOLR
1750.0000 mg | INTRAVENOUS | Status: DC
  Filled 2018-06-09: qty 1750

## 2018-06-09 MED ORDER — SODIUM CHLORIDE 0.9% FLUSH
3.0000 mL | Freq: Two times a day (BID) | INTRAVENOUS | Status: DC
  Administered 2018-06-09 – 2018-06-13 (×4): 3 mL via INTRAVENOUS

## 2018-06-09 MED ORDER — INSULIN ASPART 100 UNIT/ML ~~LOC~~ SOLN
0.0000 [IU] | Freq: Every day | SUBCUTANEOUS | Status: DC
  Administered 2018-06-09: 2 [IU] via SUBCUTANEOUS
  Administered 2018-06-10: 5 [IU] via SUBCUTANEOUS
  Administered 2018-06-11 – 2018-06-13 (×2): 2 [IU] via SUBCUTANEOUS
  Administered 2018-06-14 – 2018-06-15 (×2): 3 [IU] via SUBCUTANEOUS
  Filled 2018-06-09 (×6): qty 1

## 2018-06-09 MED ORDER — MOMETASONE FURO-FORMOTEROL FUM 200-5 MCG/ACT IN AERO
2.0000 | INHALATION_SPRAY | Freq: Two times a day (BID) | RESPIRATORY_TRACT | Status: DC
  Administered 2018-06-10 – 2018-06-16 (×13): 2 via RESPIRATORY_TRACT
  Filled 2018-06-09: qty 8.8

## 2018-06-09 MED ORDER — SODIUM CHLORIDE 0.9% FLUSH
3.0000 mL | INTRAVENOUS | Status: DC | PRN
  Administered 2018-06-11: 3 mL via INTRAVENOUS
  Filled 2018-06-09: qty 3

## 2018-06-09 MED ORDER — ALPRAZOLAM 0.25 MG PO TABS: 0.2500 mg | ORAL_TABLET | Freq: Every evening | ORAL | Status: DC | PRN

## 2018-06-09 MED ORDER — LEVOTHYROXINE SODIUM 50 MCG PO TABS
50.0000 ug | ORAL_TABLET | Freq: Every day | ORAL | Status: DC
  Administered 2018-06-10 – 2018-06-16 (×5): 50 ug via ORAL
  Filled 2018-06-09 (×5): qty 1

## 2018-06-09 MED ORDER — ALBUTEROL SULFATE (2.5 MG/3ML) 0.083% IN NEBU: 2.5000 mg | INHALATION_SOLUTION | RESPIRATORY_TRACT | Status: DC | PRN

## 2018-06-09 MED ORDER — SENNOSIDES-DOCUSATE SODIUM 8.6-50 MG PO TABS: 1.0000 | ORAL_TABLET | Freq: Every evening | ORAL | Status: DC | PRN

## 2018-06-09 MED ORDER — SODIUM CHLORIDE 0.9% FLUSH: 3.0000 mL | Freq: Once | INTRAVENOUS | Status: DC

## 2018-06-09 NOTE — Consult Note (Signed)
Reason for Consult: Ulceration with necrotic changes left foot Referring Physician: Ramez Arrona. is an 85 y.o. male.  HPI: This is an 85 year old male with diabetes well-known to me outpatient who has had a sore on his left foot recently being managed by the wound care center.  Apparently was seen earlier today and they did not like the appearance and sent him over to the emergency department for admission and vascular studies as his recent outpatient noninvasive vascular studies showed significant decreased blood flow down to the left great toe and forefoot area.  Past Medical History:  Diagnosis Date  . Arthritis   . CAD    a. MI 01/29/1996 tx'd w/ TPA @ [place] [facility]; b. Myoview 06/2005: EF 50%, scar @ apex, mild peri-infarct ischemia  . Cancer ([place])    skin  . Chronic atrial fibrillation    a. since 2006; b. on warfarin  . Chronic diastolic CHF (congestive heart failure) ([place])    a. echo 04/2006: EF lower limits of nl, mod LVH, mild aortic root dilatation, & mild MR, biatrial enlargement; b. echo 04/2013: EF 60%, mod dilated LA, mild MR & TR, mod pulm HTN w/ RV systolic pressure 53, c. echo 04/21/14: EF 55-60%, unable to exclude WMA, severely dilated LA 6.6 cm, nl RVSP, mildly dilated aortic root  . COPD (chronic obstructive pulmonary disease) (H[facility])    oxygen prn at home  . CVA 1884,1660   x2  . DM   . Falls   . GERD (gastroesophageal reflux disease)   . History of kidney stones   . HYPERLIPIDEMIA   . HYPERTENSION   . Kidney stone    a. s/p left ureteral stenting 04/24/14  . Left arm weakness    limited movement. S/P fall injury  . Neuropathy of both feet   . Poor balance   . Wears dentures    full upper and lower    Past Surgical History:  Procedure Laterality Date  . BLADDER SURGERY     stent placement   . CARDIAC CATHETERIZATION  1997   [facility]  . CAROTID STENT INSERTION  1997  . CATARACT EXTRACTION W/PHACO Left 10/11/2017   Procedure: CATARACT EXTRACTION PHACO AND  INTRAOCULAR LENS PLACEMENT ([place]) COMPLICATED LEFT DIABETIC;  Surgeon: Leandrew Koyanagi, MD;  Location: [facility];  Service: Ophthalmology;  Laterality: Left;  MALYUGIN Diabetic - insulin  . CATARACT EXTRACTION W/PHACO Right 11/30/2017   Procedure: CATARACT EXTRACTION PHACO AND INTRAOCULAR LENS PLACEMENT ([place]) COMPLICATED  RIGHT DIABETIC;  Surgeon: Leandrew Koyanagi, MD;  Location: [facility];  Service: Ophthalmology;  Laterality: Right;  Diabetic - insulin  . CIRCUMCISION  2016  . CORONARY ANGIOPLASTY  1997   s/p stent placement x 2   . CYSTOSCOPY W/ URETERAL STENT REMOVAL Left 10/09/2014   Procedure: CYSTOSCOPY WITH STENT REMOVAL;  Surgeon: Hollice Espy, MD;  Location: [facility];  Service: Urology;  Laterality: Left;  . CYSTOSCOPY WITH STENT PLACEMENT Left 10/09/2014   Procedure: CYSTOSCOPY WITH STENT PLACEMENT;  Surgeon: Hollice Espy, MD;  Location: [facility];  Service: Urology;  Laterality: Left;  . KIDNEY SURGERY  05/2013   s/p stent placement   . stents ureters Bilateral   . TONSILLECTOMY AND ADENOIDECTOMY  1959  . URETEROSCOPY WITH HOLMIUM LASER LITHOTRIPSY Left 10/09/2014   Procedure: URETEROSCOPY WITH HOLMIUM LASER LITHOTRIPSY;  Surgeon: Hollice Espy, MD;  Location: [facility];  Service: Urology;  Laterality: Left;    Family History  Problem Relation Age of Onset  .  Heart disease Mother   . Heart disease Maternal Grandmother   . Diabetes Maternal Grandmother   . Cancer Neg Hx   . Stroke Neg Hx     Social History:  reports that he quit smoking about 28 years ago. His smoking use included cigarettes. He has a 80.00 pack-year smoking history. He quit smokeless tobacco use about 28 years ago. He reports that he does not drink alcohol or use drugs.  Allergies:  Allergies  Allergen Reactions  . Contrast Media [Iodinated Diagnostic Agents] Shortness Of Breath  . Morphine And Related Other (See Comments)    Hallucinations   . Niacin And Related Dermatitis   . Other     Other reaction(s): SHORTNESS OF BREATH  . Amlodipine Rash    Medications:  Scheduled: . atorvastatin  40 mg Oral Daily  . citalopram  10 mg Oral Daily  . dorzolamide  1 drop Both Eyes TID  . ezetimibe  10 mg Oral Daily  . finasteride  5 mg Oral Daily  . gabapentin  100 mg Oral TID  . insulin aspart  0-5 Units Subcutaneous QHS  . [START ON 06/10/2018] insulin aspart  0-9 Units Subcutaneous TID WC  . [START ON 06/10/2018] insulin aspart  16 Units Subcutaneous TID WC  . insulin detemir  42 Units Subcutaneous Q2200  . latanoprost  1 drop Both Eyes QHS  . [START ON 06/10/2018] levothyroxine  50 mcg Oral QAC breakfast  . metoprolol succinate  50 mg Oral BID  . mometasone-formoterol  2 puff Inhalation BID  . montelukast  10 mg Oral QHS  . [START ON 06/10/2018] predniSONE  10 mg Oral Q breakfast  . sodium chloride flush  3 mL Intravenous Once  . sodium chloride flush  3 mL Intravenous Q12H  . tamsulosin  0.4 mg Oral Daily  . torsemide  40 mg Oral BID  . traMADol  50 mg Oral BID  . [START ON 06/10/2018] umeclidinium bromide  1 puff Inhalation Daily    Results for orders placed or performed during the hospital encounter of 06/09/18 (from the past 48 hour(s))  Lactic acid, plasma     Status: Abnormal   Collection Time: 06/09/18  1:02 PM  Result Value Ref Range   Lactic Acid, Venous 2.1 (HH) 0.5 - 1.9 mmol/L    Comment: CRITICAL RESULT CALLED TO, READ BACK BY AND VERIFIED WITH COLLYN GILLESPIE 06/09/18 @ [place] Performed at [facility], [address] [place], [place] [address]   Comprehensive metabolic panel     Status: Abnormal   Collection Time: 06/09/18  1:02 PM  Result Value Ref Range   Sodium 140 135 - 145 mmol/L   Potassium 4.1 3.5 - 5.1 mmol/L   Chloride 100 98 - 111 mmol/L   CO2 29 22 - 32 mmol/L   Glucose, Bld 115 (H) 70 - 99 mg/dL   BUN 50 (H) 8 - 23 mg/dL   Creatinine, Ser 1.62 (H) 0.61 - 1.24 mg/dL   Calcium 9.4 8.9 - 10.3 mg/dL   Total  Protein 7.2 6.5 - 8.1 g/dL   Albumin 3.3 (L) 3.5 - 5.0 g/dL   AST 14 (L) 15 - 41 U/L   ALT 14 0 - 44 U/L   Alkaline Phosphatase 67 38 - 126 U/L   Total Bilirubin 0.7 0.3 - 1.2 mg/dL   GFR calc non Af Amer 38 (L) >60 mL/min   GFR calc Af Amer 45 (L) >60 mL/min   Anion gap  11 5 - 15    Comment: Performed at [facility], [address] [place], [place] [address]  CBC with Differential     Status: Abnormal   Collection Time: 06/09/18  1:02 PM  Result Value Ref Range   WBC 20.4 (H) 4.0 - 10.5 K/uL   RBC 4.69 4.22 - 5.81 MIL/uL   Hemoglobin 13.6 13.0 - 17.0 g/dL   HCT 42.9 39.0 - 52.0 %   MCV 91.5 80.0 - 100.0 fL   MCH 29.0 26.0 - 34.0 pg   MCHC 31.7 30.0 - 36.0 g/dL   RDW 16.9 (H) 11.5 - 15.5 %   Platelets 277 150 - 400 K/uL   nRBC 0.0 0.0 - 0.2 %   Neutrophils Relative % 80 %   Neutro Abs 16.3 (H) 1.7 - 7.7 K/uL   Lymphocytes Relative 11 %   Lymphs Abs 2.3 0.7 - 4.0 K/uL   Monocytes Relative 8 %   Monocytes Absolute 1.6 (H) 0.1 - 1.0 K/uL   Eosinophils Relative 0 %   Eosinophils Absolute 0.0 0.0 - 0.5 K/uL   Basophils Relative 0 %   Basophils Absolute 0.1 0.0 - 0.1 K/uL   Immature Granulocytes 1 %   Abs Immature Granulocytes 0.15 (H) 0.00 - 0.07 K/uL    Comment: Performed at [facility], [address] [place], [place] [address]  Protime-INR     Status: None   Collection Time: 06/09/18  1:05 PM  Result Value Ref Range   Prothrombin Time 15.0 11.4 - 15.2 seconds   INR 1.2 0.8 - 1.2    Comment: (NOTE) INR goal varies based on device and disease states. Performed at [facility], [address] [place], [place] [address]   APTT     Status: None   Collection Time: 06/09/18  1:05 PM  Result Value Ref Range   aPTT 33 24 - 36 seconds    Comment: Performed at [facility], [address] [place], [place] [address]  Glucose, capillary     Status: Abnormal   Collection Time: 06/09/18  4:09 PM  Result Value Ref Range    Glucose-Capillary 128 (H) 70 - 99 mg/dL  Lactic acid, plasma     Status: Abnormal   Collection Time: 06/09/18  5:29 PM  Result Value Ref Range   Lactic Acid, Venous 2.8 (HH) 0.5 - 1.9 mmol/L    Comment: CRITICAL RESULT CALLED TO, READ BACK BY AND VERIFIED WITH DEE MCCLAIN 06/09/18 @ 4008  [place] Performed at [facility], [address] [place], [place] [address]   Glucose, capillary     Status: Abnormal   Collection Time: 06/09/18  9:12 PM  Result Value Ref Range   Glucose-Capillary 212 (H) 70 - 99 mg/dL    Dg Foot Complete Left  Result Date: 06/09/2018 CLINICAL DATA:  Left great toe nonhealing wound. History of diabetes. EXAM: LEFT FOOT - COMPLETE 3+ VIEW COMPARISON:  05/26/2018. FINDINGS: Again demonstrated is soft tissue swelling and mild irregularity medial to the 1st MTP joint. No soft tissue gas, bone destruction or periosteal reaction. Diffuse dorsal soft tissue swelling is slightly less prominent. Moderate-sized inferior posterior calcaneal spurs are again demonstrated. IMPRESSION: No radiographic evidence of osteomyelitis. Electronically Signed   By: Claudie Revering M.D.   On: 06/09/2018 16:56    Review of Systems  Constitutional: Negative for chills and fever.  HENT: Negative for congestion and tinnitus.   Eyes: Negative for blurred vision and double vision.  Respiratory: Negative for cough and shortness of breath.   Cardiovascular: Negative for chest pain and palpitations.  Gastrointestinal: Negative for nausea and vomiting.  Genitourinary: Negative for dysuria and frequency.  Musculoskeletal: Negative for joint pain and myalgias.  Skin:       Patient relates a black sore on his left foot and the tips of both great toes.  Does not relate any drainage  Neurological:       Patient relates some paresthesias in the feet related to his diabetes  Endo/Heme/Allergies: Does not bruise/bleed easily.  Psychiatric/Behavioral: Negative for depression. The patient is not  nervous/anxious.    Blood pressure 129/70, pulse 85, temperature 97.8 F (36.6 C), temperature source Oral, resp. rate 18, height 5\' 6"  (1.676 m), weight 85.3 kg, SpO2 97 %. Physical Exam  Cardiovascular:  Pedal pulses could not clearly be palpated today.  Musculoskeletal:     Comments: Adequate range of motion of the pedal joints.  Muscle testing deferred.  Neurological:  Protective threshold with a monofilament wire intact to the digits on the right and the left hallux but absence on the remainder of the toes.  Proprioception impaired bilateral.  Skin:  The skin is thin dry and atrophic with absent hair growth.  Some bilateral edema and hyperpigmentation.  Intact fibrotic eschar over the medial aspect of the left first MTPJ as well as a small area at the distal tip of the hallux bilateral.  Post debridement there is a full-thickness ulceration with significant purulence and underlying necrotic tissue at the medial first metatarsal head measuring approximately 3 cm x 2 cm with irregular borders.  This does extend down to the level of the metatarsal phalangeal joint.  No clear evidence of exposed bone noted.  Some mild surrounding cellulitis.  No advancing cellulitis.    Assessment/Plan: Assessment: 1.  Full-thickness ulceration with underlying necrosis and abscess left forefoot. 2.  Diabetes with associated neuropathy. 3.  Peripheral vascular disease.  Plan: Excisional debridement of the devitalized tissue from the medial aspect of the first metatarsal head sharply using a 15 blade and tissue nippers.  Debrided to a depth of the metatarsal phalangeal joint capsule.  A culture was taken of the purulence for sensitivities.  A sterile saline wet-to-dry gauze bandage applied to the left foot.  Discussed with the patient that he will most likely need some type of debridement pending results from his MRI.  Discussed that there is a good likelihood that he may need a first ray resection.  With the  debridement today and local wound care hopefully we can wait until after he is able to be revascularized next week.  Continue to follow over the weekend.  [name] 06/09/2018, 9:24 PM

## 2018-06-09 NOTE — Consult Note (Addendum)
Pharmacy Antibiotic Note  [name]. is a 85 y.o. male admitted on 06/09/2018 with wound infection.  Pharmacy has been consulted for Vancomycin/Zosyn dosing.  Plan: Zosyn 3.375g q 8h (extended 4-hour infusion)  Vancomycin - total of 2g loading dose (1g x 2)  Vancomycin 1750 mg IV Q 48 hrs. Goal AUC 400-550. Expected AUC: 47 SCr used: 1.62  Weight: 188 lb 0.8 oz (85.3 kg)  Temp (24hrs), Avg:98.7 F (37.1 C), Min:98.7 F (37.1 C), Max:98.7 F (37.1 C)  Recent Labs  Lab 06/09/18 1302 06/09/18 1729  WBC 20.4*  --   CREATININE 1.62*  --   LATICACIDVEN 2.1* 2.8*    Estimated Creatinine Clearance: 34.8 mL/min (A) (by C-G formula based on SCr of 1.62 mg/dL (H)).    Allergies  Allergen Reactions  . Contrast Media [Iodinated Diagnostic Agents] Shortness Of Breath  . Morphine And Related Other (See Comments)    Hallucinations   . Niacin And Related Dermatitis  . Other     Other reaction(s): SHORTNESS OF BREATH  . Amlodipine Rash    Antimicrobials this admission: Vancomycin 2/28 >>  Zosyn 2/28 >>   Dose adjustments this admission: None  Microbiology results: 2/28 BCx: pending  Thank you for allowing pharmacy to be a part of this patient's care.  Lu Duffel, PharmD, BCPS Clinical Pharmacist 06/09/2018 6:40 PM

## 2018-06-09 NOTE — ED Triage Notes (Signed)
States went to the wound center and foot ulcer is not improving.  Had a vascular check last week and left great toe has only 15% circulation.  Patient's paperwork from [facility] states patient Is being referred to ED for evaluation of poorly healing foot ulcer.

## 2018-06-09 NOTE — Progress Notes (Signed)
Advanced Care Plan.  Purpose of Encounter: CODE STATUS. Parties in Attendance: The patient, his son and me. Patient's Decisional Capacity: Yes. Medical Story: [name]  is a 85 y.o. male with a known history of CAD, A. fib, hypertension, diabetes, chronic diastolic CHF, CVA, COPD with oxygen at night, hyperlipidemia, neuropathy and foot ulcer, etc.  the patient is being admitted for left foot diabetic ulcer infection, rule out osteomyelitis.  I discussed with the patient about his current condition, prognosis and CODE STATUS.  The patient does want to be resuscitated and intubated if he has cardiopulmonary arrest. Plan:  Code Status: Full code. Time spent discussing advance care planning: 17 minutes.

## 2018-06-09 NOTE — H&P (Addendum)
[name]    MR#:  [id number]  DATE OF BIRTH:  Apr 24, 1931  DATE OF ADMISSION:  06/09/2018  PRIMARY CARE PHYSICIAN: Jearld Fenton, NP   REQUESTING/REFERRING PHYSICIAN: Dr. Kerman Passey.  CHIEF COMPLAINT:  No chief complaint on file.  Worsening left foot ulcer infection. HISTORY OF PRESENT ILLNESS:  [name]  is a 85 y.o. male with a known history of CAD, A. fib, hypertension, diabetes, chronic diastolic CHF, CVA, COPD with oxygen at night, hyperlipidemia, neuropathy and foot ulcer, etc. The patient presents the ED with worsening left foot infection.  The patient has been following up with wound care for left foot ulcer, which has been worsening for the past few days. He noticed necrotic tissue with surrounding erythema and swelling.  He also said there is pus over the past 2 days.  He is sent to ED by wound care clinic today.  He denies any fever or chills but he has leukocytosis 20,000.  X-ray of left foot that did not show any osteomyelitis. PAST MEDICAL HISTORY:   Past Medical History:  Diagnosis Date  . Arthritis   . CAD    a. MI 01/29/1996 tx'd w/ TPA @ [facility]; b. Myoview 06/2005: EF 50%, scar @ apex, mild peri-infarct ischemia  . Cancer ([place])    skin  . Chronic atrial fibrillation    a. since 2006; b. on warfarin  . Chronic diastolic CHF (congestive heart failure) ([place])    a. echo 04/2006: EF lower limits of nl, mod LVH, mild aortic root dilatation, & mild MR, biatrial enlargement; b. echo 04/2013: EF 60%, mod dilated LA, mild MR & TR, mod pulm HTN w/ RV systolic pressure 53, c. echo 04/21/14: EF 55-60%, unable to exclude WMA, severely dilated LA 6.6 cm, nl RVSP, mildly dilated aortic root  . COPD (chronic obstructive pulmonary disease) (HCC)    oxygen prn at home  . CVA 6712,4580   x2  . DM   . Falls   . GERD (gastroesophageal reflux disease)   . History of kidney stones   . HYPERLIPIDEMIA   .  HYPERTENSION   . Kidney stone    a. s/p left ureteral stenting 04/24/14  . Left arm weakness    limited movement. S/P fall injury  . Neuropathy of both feet   . Poor balance   . Wears dentures    full upper and lower    PAST SURGICAL HISTORY:   Past Surgical History:  Procedure Laterality Date  . BLADDER SURGERY     stent placement   . CARDIAC CATHETERIZATION  1997   [facility]  . CAROTID STENT INSERTION  1997  . CATARACT EXTRACTION W/PHACO Left 10/11/2017   Procedure: CATARACT EXTRACTION PHACO AND INTRAOCULAR LENS PLACEMENT ([place]) COMPLICATED LEFT DIABETIC;  Surgeon: Leandrew Koyanagi, MD;  Location: [facility];  Service: Ophthalmology;  Laterality: Left;  MALYUGIN Diabetic - insulin  . CATARACT EXTRACTION W/PHACO Right 11/30/2017   Procedure: CATARACT EXTRACTION PHACO AND INTRAOCULAR LENS PLACEMENT ([place]) COMPLICATED  RIGHT DIABETIC;  Surgeon: Leandrew Koyanagi, MD;  Location: [facility] [place] [facility];  Service: Ophthalmology;  Laterality: Right;  Diabetic - insulin  . CIRCUMCISION  2016  . CORONARY ANGIOPLASTY  1997   s/p stent placement x 2   . CYSTOSCOPY W/ URETERAL STENT REMOVAL Left 10/09/2014   Procedure: CYSTOSCOPY WITH STENT REMOVAL;  Surgeon: Hollice Espy, MD;  Location: [facility];  Service: Urology;  Laterality: Left;  .  CYSTOSCOPY WITH STENT PLACEMENT Left 10/09/2014   Procedure: CYSTOSCOPY WITH STENT PLACEMENT;  Surgeon: Hollice Espy, MD;  Location: [facility];  Service: Urology;  Laterality: Left;  . KIDNEY SURGERY  05/2013   s/p stent placement   . stents ureters Bilateral   . TONSILLECTOMY AND ADENOIDECTOMY  1959  . URETEROSCOPY WITH HOLMIUM LASER LITHOTRIPSY Left 10/09/2014   Procedure: URETEROSCOPY WITH HOLMIUM LASER LITHOTRIPSY;  Surgeon: Hollice Espy, MD;  Location: [facility];  Service: Urology;  Laterality: Left;    SOCIAL HISTORY:   Social History   Tobacco Use  . Smoking status: Former Smoker    Packs/day: 2.00    Years: 40.00    Pack years: 80.00      Types: Cigarettes    Last attempt to quit: 05/24/1990    Years since quitting: 28.0  . Smokeless tobacco: Former Systems developer    Quit date: 05/24/1990  Substance Use Topics  . Alcohol use: No    FAMILY HISTORY:   Family History  Problem Relation Age of Onset  . Heart disease Mother   . Heart disease Maternal Grandmother   . Diabetes Maternal Grandmother   . Cancer Neg Hx   . Stroke Neg Hx     DRUG ALLERGIES:   Allergies  Allergen Reactions  . Contrast Media [Iodinated Diagnostic Agents] Shortness Of Breath  . Morphine And Related Other (See Comments)    Hallucinations   . Niacin And Related Dermatitis  . Other     Other reaction(s): SHORTNESS OF BREATH  . Amlodipine Rash    REVIEW OF SYSTEMS:   Review of Systems  Constitutional: Negative for chills, fever and malaise/fatigue.  HENT: Negative for sore throat.   Eyes: Negative for blurred vision and double vision.  Respiratory: Negative for cough, hemoptysis, shortness of breath, wheezing and stridor.   Cardiovascular: Negative for chest pain, palpitations, orthopnea and leg swelling.  Gastrointestinal: Negative for abdominal pain, blood in stool, diarrhea, melena, nausea and vomiting.  Genitourinary: Negative for dysuria, flank pain and hematuria.  Musculoskeletal: Negative for back pain and joint pain.       Left foot erythema, necrosis and discharge.  Skin: Negative for rash.  Neurological: Negative for dizziness, sensory change, focal weakness, seizures, loss of consciousness, weakness and headaches.  Endo/Heme/Allergies: Negative for polydipsia.  Psychiatric/Behavioral: Negative for depression. The patient is not nervous/anxious.     MEDICATIONS AT HOME:   Prior to Admission medications   Medication Sig Start Date End Date Taking? Authorizing Provider  acetaminophen (TYLENOL) 650 MG CR tablet Take 650 mg by mouth every 8 (eight) hours as needed for pain.     [provider]  albuterol (PROAIR HFA) 108  (90 Base) MCG/ACT inhaler USE 1 TO 2 INHALATIONS EVERY 4 HOURS AS NEEDED Patient taking differently: Inhale 1-2 puffs into the lungs every 4 (four) hours as needed for wheezing or shortness of breath.  11/09/17   Wilhelmina Mcardle, MD  ALPRAZolam Duanne Moron) 0.25 MG tablet Take 1 tablet (0.25 mg total) by mouth at bedtime as needed for anxiety. 05/08/18   Jearld Fenton, NP  amoxicillin-clavulanate (AUGMENTIN) 875-125 MG tablet Take 1 tablet by mouth 2 (two) times daily. 05/02/18   Jearld Fenton, NP  apixaban (ELIQUIS) 2.5 MG TABS tablet Take 1 tablet (2.5 mg total) by mouth 2 (two) times daily. 03/27/18   Minna Merritts, MD  atorvastatin (LIPITOR) 40 MG tablet TAKE 1 TABLET DAILY Patient taking differently: Take 40 mg by mouth daily.  04/17/18  Jearld Fenton, NP  citalopram (CELEXA) 10 MG tablet TAKE 1 TABLET DAILY Patient taking differently: Take 10 mg by mouth daily.  03/28/18   Jearld Fenton, NP  clotrimazole (LOTRIMIN) 1 % cream Apply 1 application topically 2 (two) times daily. 11/18/17   Bedsole, Amy E, MD  colchicine (COLCRYS) 0.6 MG tablet Take 1 tablet (0.6 mg total) by mouth 2 (two) times daily as needed (acute gout flare). 06/08/18   Jearld Fenton, NP  diclofenac sodium (VOLTAREN) 1 % GEL Apply 4 g topically 4 (four) times daily. 01/05/18   Copland, Frederico Hamman, MD  dorzolamide (TRUSOPT) 2 % ophthalmic solution Place 1 drop into both eyes 3 (three) times daily.  03/31/16   [provider]  ezetimibe (ZETIA) 10 MG tablet TAKE 1 TABLET DAILY Patient taking differently: Take 10 mg by mouth daily.  04/17/18   Jearld Fenton, NP  finasteride (PROSCAR) 5 MG tablet TAKE 1 TABLET DAILY Patient taking differently: Take 5 mg by mouth daily.  06/29/17   Jearld Fenton, NP  gabapentin (NEURONTIN) 100 MG capsule TAKE 1 CAPSULE THREE TIMES A DAY 06/09/18   Jearld Fenton, NP  glipiZIDE (GLUCOTROL) 5 MG tablet TAKE 1 TABLET TWICE A DAY BEFORE MEALS 05/01/18   Baity, Coralie Keens, NP  HUMALOG KWIKPEN  100 UNIT/ML KwikPen INJECT 18 UNITS WITH BREAKFAST, 20 UNITS WITH LUNCH, AND 16 UNITS WITH DINNER 04/19/18   Baity, Coralie Keens, NP  HYDROcodone-homatropine (HYCODAN) 5-1.5 MG/5ML syrup Take 5 mLs by mouth every 8 (eight) hours as needed for cough. 04/24/18   Jearld Fenton, NP  Insulin Pen Needle (BD PEN NEEDLE NANO U/F) 32G X 4 MM MISC USE THREE TIMES A DAY FOR INSULIN ADMINISTRATION 02/15/18   Jearld Fenton, NP  Insulin Syringe-Needle U-100 30G X 1/2" 1 ML MISC 1 each by Does not apply route 3 (three) times daily. 07/19/14   Jearld Fenton, NP  JANUVIA 100 MG tablet TAKE 1 TABLET DAILY 04/25/18   Jearld Fenton, NP  latanoprost (XALATAN) 0.005 % ophthalmic solution Place 1 drop into both eyes at bedtime.  04/18/16   [provider]  LEVEMIR FLEXTOUCH 100 UNIT/ML Pen INJECT 42 UNITS UNDER THE SKIN DAILY AT 10 P.M. 04/20/18   Jearld Fenton, NP  levothyroxine (SYNTHROID, LEVOTHROID) 50 MCG tablet TAKE 1 TABLET DAILY BEFORE BREAKFAST Patient taking differently: Take 50 mcg by mouth daily before breakfast.  03/28/18   Jearld Fenton, NP  metolazone (ZAROXOLYN) 5 MG tablet Take 1 tablet (5 mg total) by mouth daily as needed (swelling). 03/13/18   Minna Merritts, MD  metoprolol succinate (TOPROL-XL) 50 MG 24 hr tablet TAKE 1 TABLET TWICE A DAY 06/09/18   Jearld Fenton, NP  montelukast (SINGULAIR) 10 MG tablet TAKE 1 TABLET AT BEDTIME Patient taking differently: Take 10 mg by mouth at bedtime.  02/24/18   Jearld Fenton, NP  nitroGLYCERIN (NITROSTAT) 0.4 MG SL tablet Place 0.4 mg under the tongue every 5 (five) minutes as needed.      [provider]  nystatin-triamcinolone (MYCOLOG II) cream Apply 1 application topically 2 (two) times daily.  03/12/14   [provider]  potassium chloride SA (K-DUR,KLOR-CON) 20 MEQ tablet Take 4 tablets (80 meq) by mouth twice daily, take an extra 1 tablet (20 meq) on the days you take metolazone 02/10/18   Minna Merritts, MD  predniSONE  (DELTASONE) 10 MG tablet Take 1 tablet (10 mg total) by  mouth daily with breakfast. 05/05/18   Jearld Fenton, NP  SYMBICORT 160-4.5 MCG/ACT inhaler USE 2 INHALATIONS TWICE A DAY Patient taking differently: Inhale 2 puffs into the lungs 2 (two) times daily.  08/31/17   Wilhelmina Mcardle, MD  tamsulosin (FLOMAX) 0.4 MG CAPS capsule Take 1 capsule (0.4 mg total) by mouth daily. 03/23/18   Jearld Fenton, NP  torsemide (DEMADEX) 20 MG tablet Take 40 mg by mouth 2 (two) times daily.    [provider]  traMADol (ULTRAM) 50 MG tablet Take 1 tablet (50 mg total) by mouth 2 (two) times daily. 03/23/18   Jearld Fenton, NP  traZODone (DESYREL) 50 MG tablet Take 2 tablets (100 mg total) by mouth at bedtime as needed for sleep. 02/16/18   Jearld Fenton, NP  umeclidinium bromide (INCRUSE ELLIPTA) 62.5 MCG/INH AEPB Inhale 1 puff into the lungs daily.    [provider]      VITAL SIGNS:  Blood pressure 104/61, pulse 83, temperature 98.7 F (37.1 C), temperature source Oral, resp. rate 18, weight 85.3 kg, SpO2 92 %.  PHYSICAL EXAMINATION:  Physical Exam  GENERAL:  85 y.o.-year-old patient lying in the bed with no acute distress.  EYES: Pupils equal, round, reactive to light and accommodation. No scleral icterus. Extraocular muscles intact.  HEENT: Head atraumatic, normocephalic. Oropharynx and nasopharynx clear.  NECK:  Supple, no jugular venous distention. No thyroid enlargement, no tenderness.  LUNGS: Normal breath sounds bilaterally, no wheezing, rales,rhonchi or crepitation. No use of accessory muscles of respiration.  CARDIOVASCULAR: S1, S2 normal. No murmurs, rubs, or gallops.  ABDOMEN: Soft, nontender, nondistended. Bowel sounds present. No organomegaly or mass.  EXTREMITIES: No pedal edema, cyanosis, or clubbing.  Bilateral leg edema 1+.  Left leg erythema.  Necrotic tissue with surrounding erythema and swelling on right great toe and bottom of left foot. NEUROLOGIC: Cranial  nerves II through XII are intact. Muscle strength 5/5 in all extremities. Sensation intact. Gait not checked.  PSYCHIATRIC: The patient is alert and oriented x 3.  SKIN: No obvious rash, lesion, or ulcer.   LABORATORY PANEL:   CBC Recent Labs  Lab 06/09/18 1302  WBC 20.4*  HGB 13.6  HCT 42.9  PLT 277   ------------------------------------------------------------------------------------------------------------------  Chemistries  Recent Labs  Lab 06/09/18 1302  NA 140  K 4.1  CL 100  CO2 29  GLUCOSE 115*  BUN 50*  CREATININE 1.62*  CALCIUM 9.4  AST 14*  ALT 14  ALKPHOS 67  BILITOT 0.7   ------------------------------------------------------------------------------------------------------------------  Cardiac Enzymes No results for input(s): TROPONINI in the last 168 hours. ------------------------------------------------------------------------------------------------------------------  RADIOLOGY:  Dg Foot Complete Left  Result Date: 06/09/2018 CLINICAL DATA:  Left great toe nonhealing wound. History of diabetes. EXAM: LEFT FOOT - COMPLETE 3+ VIEW COMPARISON:  05/26/2018. FINDINGS: Again demonstrated is soft tissue swelling and mild irregularity medial to the 1st MTP joint. No soft tissue gas, bone destruction or periosteal reaction. Diffuse dorsal soft tissue swelling is slightly less prominent. Moderate-sized inferior posterior calcaneal spurs are again demonstrated. IMPRESSION: No radiographic evidence of osteomyelitis. Electronically Signed   By: Claudie Revering M.D.   On: 06/09/2018 16:56      IMPRESSION AND PLAN:  left foot infection Left foot diabetic ulcer infection with leukocytosis, rule out osteomyelitis. The patient will be admitted to medical floor. Continue antibiotics, follow-up with CBC, follow-up podiatrist consult. MRI of left foot and vascular consult per Dr. Cleda Mccreedy.  Diabetes.  Continue Levemir, insulin AC and  start sliding scale.  Diabetes  neuropathy.  Continue current regimen. CKD.  Stage IV.  Stable. History of A. fib.  Continue Lopressor.  Hold Eliquis for possible procedure per Dr. Cleda Mccreedy. All the records are reviewed and case discussed with ED provider. Management plans discussed with the patient, his son and they are in agreement.  CODE STATUS: Full code.  TOTAL TIME TAKING CARE OF THIS PATIENT: 35 minutes.    Demetrios Loll M.D on 06/09/2018 at 5:48 PM  Between 7am to 6pm - Pager - [phone]  After 6pm go to [email or web address] - Proofreader  [facility]  Office  [phone]  CC: Primary care physician; Jearld Fenton, NP   Note: This dictation was prepared with Dragon dictation along with smaller phrase technology. Any transcriptional errors that result from this process are unin

## 2018-06-09 NOTE — ED Notes (Signed)
ED TO INPATIENT HANDOFF REPORT  ED Nurse Name and Phone #: Karena Addison [phone]  U Name/Age/Gender [name]. 85 y.o. male Room/Bed: ED05HA/ED05HA  Code Status   Code Status: Prior  Home/SNF/Other Home Patient oriented to: self, place, time and situation Is this baseline? Yes   Triage Complete: Triage complete  Chief Complaint L lateral LE ulcer  Triage Note States went to the wound center and foot ulcer is not improving.  Had a vascular check last week and left great toe has only 15% circulation.  Patient's paperwork from [place] states patient Is being referred to ED for evaluation of poorly healing foot ulcer.   Allergies Allergies  Allergen Reactions  . Contrast Media [Iodinated Diagnostic Agents] Shortness Of Breath  . Morphine And Related Other (See Comments)    Hallucinations   . Niacin And Related Dermatitis  . Other     Other reaction(s): SHORTNESS OF BREATH  . Amlodipine Rash    Level of Care/Admitting Diagnosis ED Disposition    ED Disposition Condition [facility] Area: [place] [[id number]]  Level of Care: Med-Surg [16]  Diagnosis: Diabetic infection of left foot [facility]) [[id number]]  Admitting Physician: Demetrios Loll [[id number]]  Attending Physician: Demetrios Loll [[id number]]  Estimated length of stay: 3 - 4 days  Certification:: I certify this patient will need inpatient services for at least 2 midnights  PT Class (Do Not Modify): Inpatient [101]  PT Acc Code (Do Not Modify): Private [1]       B Medical/Surgery History Past Medical History:  Diagnosis Date  . Arthritis   . CAD    a. MI 01/29/1996 tx'd w/ TPA @ [facility]; b. Myoview 06/2005: EF 50%, scar @ apex, mild peri-infarct ischemia  . Cancer ([place])    skin  . Chronic atrial fibrillation    a. since 2006; b. on warfarin  . Chronic diastolic CHF (congestive heart failure) ([place])    a. echo 04/2006: EF lower limits of nl, mod LVH, mild aortic root dilatation, & mild MR,  biatrial enlargement; b. echo 04/2013: EF 60%, mod dilated LA, mild MR & TR, mod pulm HTN w/ RV systolic pressure 53, c. echo 04/21/14: EF 55-60%, unable to exclude WMA, severely dilated LA 6.6 cm, nl RVSP, mildly dilated aortic root  . COPD (chronic obstructive pulmonary disease) (HCC)    oxygen prn at home  . CVA 2376,2831   x2  . DM   . Falls   . GERD (gastroesophageal reflux disease)   . History of kidney stones   . HYPERLIPIDEMIA   . HYPERTENSION   . Kidney stone    a. s/p left ureteral stenting 04/24/14  . Left arm weakness    limited movement. S/P fall injury  . Neuropathy of both feet   . Poor balance   . Wears dentures    full upper and lower   Past Surgical History:  Procedure Laterality Date  . BLADDER SURGERY     stent placement   . CARDIAC CATHETERIZATION  1997   [facility]  . CAROTID STENT INSERTION  1997  . CATARACT EXTRACTION W/PHACO Left 10/11/2017   Procedure: CATARACT EXTRACTION PHACO AND INTRAOCULAR LENS PLACEMENT ([place]) COMPLICATED LEFT DIABETIC;  Surgeon: Leandrew Koyanagi, MD;  Location: [place];  Service: Ophthalmology;  Laterality: Left;  MALYUGIN Diabetic - insulin  . CATARACT EXTRACTION W/PHACO Right 11/30/2017   Procedure: CATARACT EXTRACTION PHACO AND INTRAOCULAR LENS PLACEMENT ([facility]) COMPLICATED  RIGHT DIABETIC;  Surgeon:  Leandrew Koyanagi, MD;  Location: [place];  Service: Ophthalmology;  Laterality: Right;  Diabetic - insulin  . CIRCUMCISION  2016  . CORONARY ANGIOPLASTY  1997   s/p stent placement x 2   . CYSTOSCOPY W/ URETERAL STENT REMOVAL Left 10/09/2014   Procedure: CYSTOSCOPY WITH STENT REMOVAL;  Surgeon: Hollice Espy, MD;  Location: [facility];  Service: Urology;  Laterality: Left;  . CYSTOSCOPY WITH STENT PLACEMENT Left 10/09/2014   Procedure: CYSTOSCOPY WITH STENT PLACEMENT;  Surgeon: Hollice Espy, MD;  Location: [facility];  Service: Urology;  Laterality: Left;  . KIDNEY SURGERY  05/2013   s/p stent placement   . stents  ureters Bilateral   . TONSILLECTOMY AND ADENOIDECTOMY  1959  . URETEROSCOPY WITH HOLMIUM LASER LITHOTRIPSY Left 10/09/2014   Procedure: URETEROSCOPY WITH HOLMIUM LASER LITHOTRIPSY;  Surgeon: Hollice Espy, MD;  Location: [facility];  Service: Urology;  Laterality: Left;     A IV Location/Drains/Wounds Patient Lines/Drains/Airways Status   Active Line/Drains/Airways    Name:   Placement date:   Placement time:   Site:   Days:   Peripheral IV 06/09/18 Right Forearm   06/09/18    1737    Forearm   less than 1   Peripheral IV 06/09/18 Right Antecubital   06/09/18    1730    Antecubital   less than 1   Wound / Incision (Open or Dehisced) 04/17/18 Venous stasis ulcer Leg Left;Lower weeping wound, described by patient as "venous ulcer with 3 open spots"   04/17/18    1800    Leg   53          Intake/Output Last 24 hours No intake or output data in the 24 hours ending 06/09/18 1810  Labs/Imaging Results for orders placed or performed during the hospital encounter of 06/09/18 (from the past 48 hour(s))  Lactic acid, plasma     Status: Abnormal   Collection Time: 06/09/18  1:02 PM  Result Value Ref Range   Lactic Acid, Venous 2.1 (HH) 0.5 - 1.9 mmol/L    Comment: CRITICAL RESULT CALLED TO, READ BACK BY AND VERIFIED WITH COLLYN GILLESPIE 06/09/18 @ [place] Performed at [facility], [address] [place], [place] [address]   Comprehensive metabolic panel     Status: Abnormal   Collection Time: 06/09/18  1:02 PM  Result Value Ref Range   Sodium 140 135 - 145 mmol/L   Potassium 4.1 3.5 - 5.1 mmol/L   Chloride 100 98 - 111 mmol/L   CO2 29 22 - 32 mmol/L   Glucose, Bld 115 (H) 70 - 99 mg/dL   BUN 50 (H) 8 - 23 mg/dL   Creatinine, Ser 1.62 (H) 0.61 - 1.24 mg/dL   Calcium 9.4 8.9 - 10.3 mg/dL   Total Protein 7.2 6.5 - 8.1 g/dL   Albumin 3.3 (L) 3.5 - 5.0 g/dL   AST 14 (L) 15 - 41 U/L   ALT 14 0 - 44 U/L   Alkaline Phosphatase 67 38 - 126 U/L   Total Bilirubin 0.7 0.3 - 1.2  mg/dL   GFR calc non Af Amer 38 (L) >60 mL/min   GFR calc Af Amer 45 (L) >60 mL/min   Anion gap 11 5 - 15    Comment: Performed at [facility], [address], [place], [place] [address]  CBC with Differential     Status: Abnormal   Collection Time: 06/09/18  1:02 PM  Result Value Ref Range  WBC 20.4 (H) 4.0 - 10.5 K/uL   RBC 4.69 4.22 - 5.81 MIL/uL   Hemoglobin 13.6 13.0 - 17.0 g/dL   HCT 42.9 39.0 - 52.0 %   MCV 91.5 80.0 - 100.0 fL   MCH 29.0 26.0 - 34.0 pg   MCHC 31.7 30.0 - 36.0 g/dL   RDW 16.9 (H) 11.5 - 15.5 %   Platelets 277 150 - 400 K/uL   nRBC 0.0 0.0 - 0.2 %   Neutrophils Relative % 80 %   Neutro Abs 16.3 (H) 1.7 - 7.7 K/uL   Lymphocytes Relative 11 %   Lymphs Abs 2.3 0.7 - 4.0 K/uL   Monocytes Relative 8 %   Monocytes Absolute 1.6 (H) 0.1 - 1.0 K/uL   Eosinophils Relative 0 %   Eosinophils Absolute 0.0 0.0 - 0.5 K/uL   Basophils Relative 0 %   Basophils Absolute 0.1 0.0 - 0.1 K/uL   Immature Granulocytes 1 %   Abs Immature Granulocytes 0.15 (H) 0.00 - 0.07 K/uL    Comment: Performed at [facility], [address] [place], [place] [address]  Protime-INR     Status: None   Collection Time: 06/09/18  1:05 PM  Result Value Ref Range   Prothrombin Time 15.0 11.4 - 15.2 seconds   INR 1.2 0.8 - 1.2    Comment: (NOTE) INR goal varies based on device and disease states. Performed at [facility], [address] [place], [place] [address]   APTT     Status: None   Collection Time: 06/09/18  1:05 PM  Result Value Ref Range   aPTT 33 24 - 36 seconds    Comment: Performed at [facility], [address] [place], [place] [address]  Glucose, capillary     Status: Abnormal   Collection Time: 06/09/18  4:09 PM  Result Value Ref Range   Glucose-Capillary 128 (H) 70 - 99 mg/dL  Lactic acid, plasma     Status: Abnormal   Collection Time: 06/09/18  5:29 PM  Result Value Ref Range   Lactic Acid, Venous 2.8 (HH) 0.5 - 1.9 mmol/L     Comment: CRITICAL RESULT CALLED TO, READ BACK BY AND VERIFIED WITH DEE Maisy [place] 06/09/18 @ 2992  [place] Performed at [facility], [address] [place], [place] [address]    Dg Foot Complete Left  Result Date: 06/09/2018 CLINICAL DATA:  Left great toe nonhealing wound. History of diabetes. EXAM: LEFT FOOT - COMPLETE 3+ VIEW COMPARISON:  05/26/2018. FINDINGS: Again demonstrated is soft tissue swelling and mild irregularity medial to the 1st MTP joint. No soft tissue gas, bone destruction or periosteal reaction. Diffuse dorsal soft tissue swelling is slightly less prominent. Moderate-sized inferior posterior calcaneal spurs are again demonstrated. IMPRESSION: No radiographic evidence of osteomyelitis. Electronically Signed   By: Claudie Revering M.D.   On: 06/09/2018 16:56    Pending Labs Unresulted Labs (From admission, onward)    Start     Ordered   06/09/18 1613  Blood culture (routine x 2)  BLOOD CULTURE X 2,   STAT     06/09/18 1612   06/09/18 1258  Urinalysis, Complete w Microscopic  ONCE - STAT,   STAT     06/09/18 1257   Signed and Held  Creatinine, serum  (heparin)  Once,   R    Comments:  Baseline for heparin therapy IF NOT ALREADY DRAWN.    Signed and Held   Signed and Held  Basic metabolic  panel  Tomorrow morning,   R     Signed and Held   Signed and Held  CBC  Tomorrow morning,   R     Signed and Held          Vitals/Pain Today's Vitals   06/09/18 1228 06/09/18 1256 06/09/18 1808  BP: 104/61  126/68  Pulse: 83  70  Resp: 18  18  Temp: 98.7 F (37.1 C)    TempSrc: Oral    SpO2: 92%  95%  Weight:  85.3 kg   PainSc:  4      Isolation Precautions No active isolations  Medications Medications  sodium chloride flush (NS) 0.9 % injection 3 mL (has no administration in time range)  vancomycin (VANCOCIN) IVPB 1000 mg/200 mL premix (1,000 mg Intravenous New Bag/Given 06/09/18 1738)    Mobility walks with device Low fall risk   Focused  Assessments    R Recommendations: See Admitting Provider Note  Report given to: [place]

## 2018-06-09 NOTE — ED Provider Notes (Signed)
[facility] Emergency Department Provider Note  Time seen: 3:56 PM  I have reviewed the triage vital signs and the nursing notes.   HISTORY  Chief Complaint Worsening wound   HPI [name]. is a 85 y.o. male with a past medical history of arthritis, CAD, A. fib on warfarin, CHF, COPD with oxygen at night, diabetes, hypertension, hyperlipidemia, neuropathy, presents to the emergency department for worsening wound to his lower extremities.  According to the patient for the past 3 to 4 months he has been treated at wound clinic for ulcers especially to his left foot.  Over the past several days patient states that ulcer has worsened has become more red in appearance and has been leaking pus over the past 2 days.  Patient went to wound care today for evaluation, they sent the patient to the emergency department given the patient's worsening clinical appearance.   Past Medical History:  Diagnosis Date  . Arthritis   . CAD    a. MI 01/29/1996 tx'd w/ TPA @ [place] [facility]; b. Myoview 06/2005: EF 50%, scar @ apex, mild peri-infarct ischemia  . Cancer ([place])    skin  . Chronic atrial fibrillation    a. since 2006; b. on warfarin  . Chronic diastolic CHF (congestive heart failure) ([place])    a. echo 04/2006: EF lower limits of nl, mod LVH, mild aortic root dilatation, & mild MR, biatrial enlargement; b. echo 04/2013: EF 60%, mod dilated LA, mild MR & TR, mod pulm HTN w/ RV systolic pressure 53, c. echo 04/21/14: EF 55-60%, unable to exclude WMA, severely dilated LA 6.6 cm, nl RVSP, mildly dilated aortic root  . COPD (chronic obstructive pulmonary disease) (HCC)    oxygen prn at home  . CVA [id number]   x2  . DM   . Falls   . GERD (gastroesophageal reflux disease)   . History of kidney stones   . HYPERLIPIDEMIA   . HYPERTENSION   . Kidney stone    a. s/p left ureteral stenting 04/24/14  . Left arm weakness    limited movement. S/P fall injury  . Neuropathy of both feet    . Poor balance   . Wears dentures    full upper and lower    Patient Active Problem List   Diagnosis Date Noted  . Rash and nonspecific skin eruption 11/18/2017  . Acquired hypothyroidism 11/22/2016  . Osteoarthritis, knee 11/22/2016  . Diabetes type 2, controlled ([place]) 06/13/2015  . BPH (benign prostatic hyperplasia) 05/14/2014  . Anxiety 05/14/2014  . Chronic obstructive pulmonary disease ([place]) 05/14/2014  . Chronic diastolic CHF (congestive heart failure) ([place])   . Insomnia 08/07/2013  . Hyperlipidemia 02/26/2009  . MITRAL REGURGITATION 02/26/2009  . Essential hypertension 02/26/2009  . MI 02/26/2009  . Coronary artery disease of native artery of native heart with stable angina pectoris ([place]) 02/26/2009  . Chronic atrial fibrillation 02/26/2009  . CVA (cerebral vascular accident) ([place]) 02/26/2009    Past Surgical History:  Procedure Laterality Date  . BLADDER SURGERY     stent placement   . CARDIAC CATHETERIZATION  1997   [facility]  . CAROTID STENT INSERTION  1997  . CATARACT EXTRACTION W/PHACO Left 10/11/2017   Procedure: CATARACT EXTRACTION PHACO AND INTRAOCULAR LENS PLACEMENT ([place]) COMPLICATED LEFT DIABETIC;  Surgeon: Leandrew Koyanagi, MD;  Location: [facility];  Service: Ophthalmology;  Laterality: Left;  MALYUGIN Diabetic - insulin  . CATARACT EXTRACTION W/PHACO Right 11/30/2017   Procedure: CATARACT EXTRACTION PHACO  AND INTRAOCULAR LENS PLACEMENT ([place]) COMPLICATED  RIGHT DIABETIC;  Surgeon: Leandrew Koyanagi, MD;  Location[place];  Service: Ophthalmology;  Laterality: Right;  Diabetic - insulin  . CIRCUMCISION  2016  . CORONARY ANGIOPLASTY  1997   s/p stent placement x 2   . CYSTOSCOPY W/ URETERAL STENT REMOVAL Left 10/09/2014   Procedure: CYSTOSCOPY WITH STENT REMOVAL;  Surgeon: Hollice Espy, MD;  Location: [facility];  Service: Urology;  Laterality: Left;  . CYSTOSCOPY WITH STENT PLACEMENT Left 10/09/2014   Procedure: CYSTOSCOPY WITH STENT  PLACEMENT;  Surgeon: Hollice Espy, MD;  Location: [facility];  Service: Urology;  Laterality: Left;  . KIDNEY SURGERY  05/2013   s/p stent placement   . stents ureters Bilateral   . TONSILLECTOMY AND ADENOIDECTOMY  1959  . URETEROSCOPY WITH HOLMIUM LASER LITHOTRIPSY Left 10/09/2014   Procedure: URETEROSCOPY WITH HOLMIUM LASER LITHOTRIPSY;  Surgeon: Hollice Espy, MD;  Location: [facility];  Service: Urology;  Laterality: Left;    Prior to Admission medications   Medication Sig Start Date End Date Taking? Authorizing Provider  acetaminophen (TYLENOL) 650 MG CR tablet Take 650 mg by mouth every 8 (eight) hours as needed for pain.     [provider]  albuterol (PROAIR HFA) 108 (90 Base) MCG/ACT inhaler USE 1 TO 2 INHALATIONS EVERY 4 HOURS AS NEEDED Patient taking differently: Inhale 1-2 puffs into the lungs every 4 (four) hours as needed for wheezing or shortness of breath.  11/09/17   Wilhelmina Mcardle, MD  ALPRAZolam Duanne Moron) 0.25 MG tablet Take 1 tablet (0.25 mg total) by mouth at bedtime as needed for anxiety. 05/08/18   Jearld Fenton, NP  amoxicillin-clavulanate (AUGMENTIN) 875-125 MG tablet Take 1 tablet by mouth 2 (two) times daily. 05/02/18   Jearld Fenton, NP  apixaban (ELIQUIS) 2.5 MG TABS tablet Take 1 tablet (2.5 mg total) by mouth 2 (two) times daily. 03/27/18   Minna Merritts, MD  atorvastatin (LIPITOR) 40 MG tablet TAKE 1 TABLET DAILY Patient taking differently: Take 40 mg by mouth daily.  04/17/18   Jearld Fenton, NP  citalopram (CELEXA) 10 MG tablet TAKE 1 TABLET DAILY Patient taking differently: Take 10 mg by mouth daily.  03/28/18   Jearld Fenton, NP  clotrimazole (LOTRIMIN) 1 % cream Apply 1 application topically 2 (two) times daily. 11/18/17   Bedsole, Amy E, MD  colchicine (COLCRYS) 0.6 MG tablet Take 1 tablet (0.6 mg total) by mouth 2 (two) times daily as needed (acute gout flare). 06/08/18   Jearld Fenton, NP  diclofenac sodium (VOLTAREN) 1 % GEL Apply 4 g  topically 4 (four) times daily. 01/05/18   Copland, Frederico Hamman, MD  dorzolamide (TRUSOPT) 2 % ophthalmic solution Place 1 drop into both eyes 3 (three) times daily.  03/31/16   [provider]  ezetimibe (ZETIA) 10 MG tablet TAKE 1 TABLET DAILY Patient taking differently: Take 10 mg by mouth daily.  04/17/18   Jearld Fenton, NP  finasteride (PROSCAR) 5 MG tablet TAKE 1 TABLET DAILY Patient taking differently: Take 5 mg by mouth daily.  06/29/17   Jearld Fenton, NP  gabapentin (NEURONTIN) 100 MG capsule TAKE 1 CAPSULE THREE TIMES A DAY 06/09/18   Jearld Fenton, NP  glipiZIDE (GLUCOTROL) 5 MG tablet TAKE 1 TABLET TWICE A DAY BEFORE MEALS 05/01/18   Baity, Coralie Keens, NP  HUMALOG KWIKPEN 100 UNIT/ML KwikPen INJECT 18 UNITS WITH BREAKFAST, 20 UNITS WITH LUNCH, AND 16 UNITS WITH  DINNER 04/19/18   Jearld Fenton, NP  HYDROcodone-homatropine (HYCODAN) 5-1.5 MG/5ML syrup Take 5 mLs by mouth every 8 (eight) hours as needed for cough. 04/24/18   Jearld Fenton, NP  Insulin Pen Needle (BD PEN NEEDLE NANO U/F) 32G X 4 MM MISC USE THREE TIMES A DAY FOR INSULIN ADMINISTRATION 02/15/18   Jearld Fenton, NP  Insulin Syringe-Needle U-100 30G X 1/2" 1 ML MISC 1 each by Does not apply route 3 (three) times daily. 07/19/14   Jearld Fenton, NP  JANUVIA 100 MG tablet TAKE 1 TABLET DAILY 04/25/18   Jearld Fenton, NP  latanoprost (XALATAN) 0.005 % ophthalmic solution Place 1 drop into both eyes at bedtime.  04/18/16   [provider]  LEVEMIR FLEXTOUCH 100 UNIT/ML Pen INJECT 42 UNITS UNDER THE SKIN DAILY AT 10 P.M. 04/20/18   Jearld Fenton, NP  levothyroxine (SYNTHROID, LEVOTHROID) 50 MCG tablet TAKE 1 TABLET DAILY BEFORE BREAKFAST Patient taking differently: Take 50 mcg by mouth daily before breakfast.  03/28/18   Jearld Fenton, NP  metolazone (ZAROXOLYN) 5 MG tablet Take 1 tablet (5 mg total) by mouth daily as needed (swelling). 03/13/18   Minna Merritts, MD  metoprolol succinate (TOPROL-XL) 50 MG 24 hr  tablet TAKE 1 TABLET TWICE A DAY 06/09/18   Jearld Fenton, NP  montelukast (SINGULAIR) 10 MG tablet TAKE 1 TABLET AT BEDTIME Patient taking differently: Take 10 mg by mouth at bedtime.  02/24/18   Jearld Fenton, NP  nitroGLYCERIN (NITROSTAT) 0.4 MG SL tablet Place 0.4 mg under the tongue every 5 (five) minutes as needed.      [provider]  nystatin-triamcinolone (MYCOLOG II) cream Apply 1 application topically 2 (two) times daily.  03/12/14   [provider]  potassium chloride SA (K-DUR,KLOR-CON) 20 MEQ tablet Take 4 tablets (80 meq) by mouth twice daily, take an extra 1 tablet (20 meq) on the days you take metolazone 02/10/18   Minna Merritts, MD  predniSONE (DELTASONE) 10 MG tablet Take 1 tablet (10 mg total) by mouth daily with breakfast. 05/05/18   Jearld Fenton, NP  SYMBICORT 160-4.5 MCG/ACT inhaler USE 2 INHALATIONS TWICE A DAY Patient taking differently: Inhale 2 puffs into the lungs 2 (two) times daily.  08/31/17   Wilhelmina Mcardle, MD  tamsulosin (FLOMAX) 0.4 MG CAPS capsule Take 1 capsule (0.4 mg total) by mouth daily. 03/23/18   Jearld Fenton, NP  torsemide (DEMADEX) 20 MG tablet Take 40 mg by mouth 2 (two) times daily.    [provider]  traMADol (ULTRAM) 50 MG tablet Take 1 tablet (50 mg total) by mouth 2 (two) times daily. 03/23/18   Jearld Fenton, NP  traZODone (DESYREL) 50 MG tablet Take 2 tablets (100 mg total) by mouth at bedtime as needed for sleep. 02/16/18   Jearld Fenton, NP  umeclidinium bromide (INCRUSE ELLIPTA) 62.5 MCG/INH AEPB Inhale 1 puff into the lungs daily.    [provider]    Allergies  Allergen Reactions  . Contrast Media [Iodinated Diagnostic Agents] Shortness Of Breath  . Morphine And Related Other (See Comments)    Hallucinations   . Niacin And Related Dermatitis  . Other     Other reaction(s): SHORTNESS OF BREATH  . Amlodipine Rash    Family History  Problem Relation Age of Onset  . Heart disease  Mother   . Heart disease Maternal Grandmother   . Diabetes Maternal Grandmother   .  Cancer Neg Hx   . Stroke Neg Hx     Social History Social History   Tobacco Use  . Smoking status: Former Smoker    Packs/day: 2.00    Years: 40.00    Pack years: 80.00    Types: Cigarettes    Last attempt to quit: 05/24/1990    Years since quitting: 28.0  . Smokeless tobacco: Former Systems developer    Quit date: 05/24/1990  Substance Use Topics  . Alcohol use: No  . Drug use: No    Review of Systems Constitutional: Negative for fever. Cardiovascular: Negative for chest pain. Respiratory: Negative for shortness of breath. Gastrointestinal: Negative for abdominal pain, vomiting Musculoskeletal: Worsening appearing ulcers to left foot Skin: Patient has worsening appearing ulcerations to the left foot with pus over the past 2 days Neurological: Negative for headache All other ROS negative  ____________________________________________   PHYSICAL EXAM:  VITAL SIGNS: ED Triage Vitals  Enc Vitals Group     BP 06/09/18 1228 104/61     Pulse Rate 06/09/18 1228 83     Resp 06/09/18 1228 18     Temp 06/09/18 1228 98.7 F (37.1 C)     Temp Source 06/09/18 1228 Oral     SpO2 06/09/18 1228 92 %     Weight 06/09/18 1256 188 lb 0.8 oz (85.3 kg)     Height --      Head Circumference --      Peak Flow --      Pain Score 06/09/18 1256 4     Pain Loc --      Pain Edu? --      Excl. in [place]? --    Constitutional: Alert and oriented. Well appearing and in no distress. Eyes: Normal exam ENT   Head: Normocephalic and atraumatic.   Mouth/Throat: Mucous membranes are moist. Cardiovascular: Normal rate, regular rhythm.  Respiratory: Normal respiratory effort without tachypnea nor retractions. Breath sounds are clear Gastrointestinal: Soft and nontender. No distention.   Musculoskeletal: Patient has necrotic tissue present to the left big toe, medial aspect of the left foot with exudate and mild  fluctuance consistent with draining abscess.  Erythema consistent with surrounding cellulitis.  New appearing ulcer to the posterior aspect of the left heel. Neurologic:  Normal speech and language. No gross focal neurologic deficits Skin:  Skin is warm, ulcerations to the left foot as described above. Psychiatric: Mood and affect are normal.   ____________________________________________   RADIOLOGY  X-ray negative for osteomyelitis or gas.  ____________________________________________   INITIAL IMPRESSION / ASSESSMENT AND PLAN / ED COURSE  Pertinent labs & imaging results that were available during my care of the patient were reviewed by me and considered in my medical decision making (see chart for details).  Patient presents to the emergency department for worsening ulcerations to his left foot sent by wound clinic. Patient states worsening appearance of left foot over the past several days now with pus drainage x2 days to the medial aspect of the left foot.  Upon arrival patient noted to have a white blood cell count of 20,000, lactate of 2.1.  Given the patient's leukocytosis with elevated lactic acid level along with worsening clinical appearance per patient we will admit for IV antibiotics.  Patient does have a history of CAD/PAD, could possibly be due to decreased arterial flow, warm foot at this time no sign of acute occlusion.   X-rays negative for osteomyelitis however given the patient's lactic acidosis along with leukocytosis patient  will be admitted to the hospitalist service.  I ordered IV vancomycin for the patient.   ____________________________________________   FINAL CLINICAL IMPRESSION(S) / ED DIAGNOSES  Cellulitis Nonhealing ulcer   Harvest Dark, MD 06/09/18 2249

## 2018-06-10 DIAGNOSIS — E11628 Type 2 diabetes mellitus with other skin complications: Secondary | ICD-10-CM

## 2018-06-10 LAB — GLUCOSE, CAPILLARY
GLUCOSE-CAPILLARY: 190 mg/dL — AB (ref 70–99)
GLUCOSE-CAPILLARY: 253 mg/dL — AB (ref 70–99)
Glucose-Capillary: 108 mg/dL — ABNORMAL HIGH (ref 70–99)
Glucose-Capillary: 59 mg/dL — ABNORMAL LOW (ref 70–99)
Glucose-Capillary: 197 mg/dL — ABNORMAL HIGH (ref 70–99)

## 2018-06-10 LAB — CBC
HCT: 41.2 % (ref 39.0–52.0)
Hemoglobin: 13.4 g/dL (ref 13.0–17.0)
MCH: 29.3 pg (ref 26.0–34.0)
MCHC: 32.5 g/dL (ref 30.0–36.0)
MCV: 90.2 fL (ref 80.0–100.0)
Platelets: 256 10*3/uL (ref 150–400)
RBC: 4.57 MIL/uL (ref 4.22–5.81)
RDW: 17 % — ABNORMAL HIGH (ref 11.5–15.5)
WBC: 20.5 10*3/uL — ABNORMAL HIGH (ref 4.0–10.5)
nRBC: 0 % (ref 0.0–0.2)

## 2018-06-10 LAB — BASIC METABOLIC PANEL
Anion gap: 10 (ref 5–15)
BUN: 41 mg/dL — ABNORMAL HIGH (ref 8–23)
CHLORIDE: 103 mmol/L (ref 98–111)
CO2: 27 mmol/L (ref 22–32)
Calcium: 9.2 mg/dL (ref 8.9–10.3)
Creatinine, Ser: 1.29 mg/dL — ABNORMAL HIGH (ref 0.61–1.24)
GFR calc Af Amer: 59 mL/min — ABNORMAL LOW (ref >60)
GFR calc non Af Amer: 51 mL/min — ABNORMAL LOW (ref >60)
Glucose, Bld: 48 mg/dL — ABNORMAL LOW (ref 70–99)
Potassium: 3 mmol/L — ABNORMAL LOW (ref 3.5–5.1)
Sodium: 140 mmol/L (ref 135–145)

## 2018-06-10 MED ORDER — POTASSIUM CHLORIDE CRYS ER 20 MEQ PO TBCR
40.0000 meq | EXTENDED_RELEASE_TABLET | Freq: Once | ORAL | Status: AC
  Administered 2018-06-10: 40 meq via ORAL
  Filled 2018-06-10: qty 2

## 2018-06-10 NOTE — Progress Notes (Signed)
Notified Dr. Posey Pronto of K 3.0. Order received for [place] Con 76meq orally once

## 2018-06-10 NOTE — Progress Notes (Signed)
Subjective/Chief Complaint: Patient seen.  Not really complaining of any pain.   Objective: Vital signs in last 24 hours: Temp:  [97.8 F (36.6 C)-98.2 F (36.8 C)] 98.2 F (36.8 C) (02/29 0751) Pulse Rate:  [70-87] 87 (02/29 0751) Resp:  [18-20] 18 (02/29 0751) BP: (115-129)/(64-76) 115/64 (02/29 0751) SpO2:  [90 %-97 %] 90 % (02/29 0751) Weight:  [85.3 kg] 85.3 kg (02/28 1948)    Intake/Output from previous day: 02/28 0701 - 02/29 0700 In: -  Out: 300 [Urine:300] Intake/Output this shift: Total I/O In: -  Out: 400 [Urine:400]  Continued full-thickness ulceration on the medial aspect of the left great toe joint with exposed capsule.  Fibrotic tissue with some undermining of the edges of the ulceration with minimal purulence at this point.  Possibility of some gouty tophi mixed into the fibrotic base is noted.  Still some mild cellulitis.  Lab Results:  Recent Labs    06/09/18 1302 06/10/18 0647  WBC 20.4* 20.5*  HGB 13.6 13.4  HCT 42.9 41.2  PLT 277 256   BMET Recent Labs    06/09/18 1302 06/10/18 0647  NA 140 140  K 4.1 3.0*  CL 100 103  CO2 29 27  GLUCOSE 115* 48*  BUN 50* 41*  CREATININE 1.62* 1.29*  CALCIUM 9.4 9.2   PT/INR Recent Labs    06/09/18 1305  LABPROT 15.0  INR 1.2   ABG No results for input(s): PHART, HCO3 in the last 72 hours.  Invalid input(s): PCO2, PO2  Studies/Results: Mr Foot Left Wo Contrast  Result Date: 06/10/2018 CLINICAL DATA:  85 year old male with diabetes well-known to me outpatient who has had a sore on his left foot recently being managed by the wound care center. Clinical exam suspicious for osteomyelitis. EXAM: MRI OF THE LEFT FOOT WITHOUT CONTRAST TECHNIQUE: Multiplanar, multisequence MR imaging of the left foot was performed. No intravenous contrast was administered. COMPARISON:  None. FINDINGS: Bones/Joint/Cartilage Soft tissue wound along the medial aspect of the first MTP joint. Small joint effusion of the  first MTP joint. Marrow edema at the base of the first proximal phalanx and head of the first metatarsal adjacent to the joint and soft tissue wound. No definite cortical destruction. 0.7 x 1.3 cm fluid collection along the plantar lateral aspect of the base of the great toe. Marrow edema and mild T1 hypointensity in the medial hallux sesamoid without cortical destruction. Marrow edema in the navicular, lateral cuneiform and to lesser extent medial and middle cuneiforms likely reflecting reactive marrow changes secondary to degenerative change and developing Charcot joint versus stress reaction. No other marrow signal abnormality. No periosteal reaction or bone destruction. No acute fracture or dislocation. Mild osteoarthritis of the first MTP joint. Ligaments Collateral ligaments are intact.  Lisfranc ligament is intact. Muscles and Tendons Flexor, peroneal and extensor compartment tendons are intact. Soft tissue 14 x 7 mm cystic mass along the dorsal aspect of the navicular-cuneiform joint most consistent with a ganglion cyst. No soft tissue mass. Soft tissue edema along the dorsal aspect of the foot with skin thickening concerning for cellulitis. IMPRESSION: 1. Soft tissue wound along the medial aspect of the first MTP joint. Small joint effusion of the first MTP joint with mild marrow edema at the base of the first proximal phalanx and head of the first metatarsal adjacent to the joint and soft tissue wound. No definite cortical destruction. Differential considerations include mild septic arthritis versus reactive changes secondary to adjacent inflammation. 0.7 x 1.3  cm fluid collection along the plantar lateral aspect of the base of the great toe concerning for a small abscess. 2. No other areas of osteomyelitis. Electronically Signed   By: Kathreen Devoid   On: 06/10/2018 08:35   Dg Foot Complete Left  Result Date: 06/09/2018 CLINICAL DATA:  Left great toe nonhealing wound. History of diabetes. EXAM: LEFT  FOOT - COMPLETE 3+ VIEW COMPARISON:  05/26/2018. FINDINGS: Again demonstrated is soft tissue swelling and mild irregularity medial to the 1st MTP joint. No soft tissue gas, bone destruction or periosteal reaction. Diffuse dorsal soft tissue swelling is slightly less prominent. Moderate-sized inferior posterior calcaneal spurs are again demonstrated. IMPRESSION: No radiographic evidence of osteomyelitis. Electronically Signed   By: Claudie Revering M.D.   On: 06/09/2018 16:56    Anti-infectives: Anti-infectives (From admission, onward)   Start     Dose/Rate Route Frequency Ordered Stop   06/11/18 2000  vancomycin (VANCOCIN) 1,750 mg in sodium chloride 0.9 % 500 mL IVPB     1,750 mg 250 mL/hr over 120 Minutes Intravenous Every 48 hours 06/09/18 1841     06/09/18 2200  piperacillin-tazobactam (ZOSYN) IVPB 3.375 g  Status:  Discontinued     3.375 g 12.5 mL/hr over 240 Minutes Intravenous Every 12 hours 06/09/18 1819 06/09/18 2016   06/09/18 2200  piperacillin-tazobactam (ZOSYN) IVPB 3.375 g     3.375 g 12.5 mL/hr over 240 Minutes Intravenous Every 8 hours 06/09/18 2016     06/09/18 1845  vancomycin (VANCOCIN) IVPB 1000 mg/200 mL premix     1,000 mg 200 mL/hr over 60 Minutes Intravenous  Once 06/09/18 1832     06/09/18 1615  vancomycin (VANCOCIN) IVPB 1000 mg/200 mL premix     1,000 mg 200 mL/hr over 60 Minutes Intravenous  Once 06/09/18 1612 06/09/18 1838      Assessment/Plan: s/p * No surgery found * Assessment: Full-thickness ulceration left foot with possibility of septic joint and early osteomyelitis.  Diabetes with peripheral vascular disease.  Plan: Wet-to-dry dressing reapplied to the left foot.  Plan at this point is for revascularization on Monday with Dr. Lucky Cowboy.  Due to MRI findings of possibility of septic joint discussed with the patient and his son that he will most likely need a first ray resection.  At this point would hopefully plan to do this on Tuesday afternoon.  For now await  vascular assessment.  LOS: 1 day    [name] 06/10/2018

## 2018-06-10 NOTE — Progress Notes (Signed)
Notified Dr. Posey Pronto that patient's blood sugar is 190. Patient due 16 units of novolog plus his sliding scale. Patient with hypoglycemia this morning. Order received to hold 16 unit of insulin for lunch and dinner and only give the sliding the scale.

## 2018-06-10 NOTE — Progress Notes (Signed)
[facility] NAME: [name]    MR#:  [id number]  DATE OF BIRTH:  [birth year]  SUBJECTIVE:   Patient denies any complaints. REVIEW OF SYSTEMS:   Review of Systems  Constitutional: Negative for chills, fever and weight loss.  HENT: Negative for ear discharge, ear pain and nosebleeds.   Eyes: Negative for blurred vision, pain and discharge.  Respiratory: Negative for sputum production, shortness of breath, wheezing and stridor.   Cardiovascular: Negative for chest pain, palpitations, orthopnea and PND.  Gastrointestinal: Negative for abdominal pain, diarrhea, nausea and vomiting.  Genitourinary: Negative for frequency and urgency.  Musculoskeletal: Negative for back pain and joint pain.  Neurological: Negative for sensory change, speech change, focal weakness and weakness.  Psychiatric/Behavioral: Negative for depression and hallucinations. The patient is not nervous/anxious.    Tolerating Diet:yes Tolerating PT: pending  DRUG ALLERGIES:   Allergies  Allergen Reactions  . Contrast Media [Iodinated Diagnostic Agents] Shortness Of Breath  . Morphine And Related Other (See Comments)    Hallucinations   . Niacin And Related Dermatitis  . Other     Other reaction(s): SHORTNESS OF BREATH  . Amlodipine Rash    VITALS:  Blood pressure 115/64, pulse 87, temperature 98.2 F (36.8 C), temperature source Oral, resp. rate 18, height 5\' 6"  (1.676 m), weight 85.3 kg, SpO2 90 %.  PHYSICAL EXAMINATION:   Physical Exam  GENERAL:  85 y.o.-year-old patient lying in the bed with no acute distress.  EYES: Pupils equal, round, reactive to light and accommodation. No scleral icterus. Extraocular muscles intact.  HEENT: Head atraumatic, normocephalic. Oropharynx and nasopharynx clear.  NECK:  Supple, no jugular venous distention. No thyroid enlargement, no tenderness.  LUNGS: Normal breath sounds bilaterally, no wheezing, rales,  rhonchi. No use of accessory muscles of respiration.  CARDIOVASCULAR: S1, S2 normal. No murmurs, rubs, or gallops.  ABDOMEN: Soft, nontender, nondistended. Bowel sounds present. No organomegaly or mass.  EXTREMITIES: No cyanosis, clubbing or edema b/l.   Left foot dressing + NEUROLOGIC: Cranial nerves II through XII are intact. No focal Motor or sensory deficits b/l.   PSYCHIATRIC:  patient is alert and oriented x 3.  SKIN: No obvious rash, lesion, or ulcer.   LABORATORY PANEL:  CBC Recent Labs  Lab 06/10/18 0647  WBC 20.5*  HGB 13.4  HCT 41.2  PLT 256    Chemistries  Recent Labs  Lab 06/09/18 1302 06/10/18 0647  NA 140 140  K 4.1 3.0*  CL 100 103  CO2 29 27  GLUCOSE 115* 48*  BUN 50* 41*  CREATININE 1.62* 1.29*  CALCIUM 9.4 9.2  AST 14*  --   ALT 14  --   ALKPHOS 67  --   BILITOT 0.7  --    Cardiac Enzymes No results for input(s): TROPONINI in the last 168 hours. RADIOLOGY:  Mr Foot Left Wo Contrast  Result Date: 06/10/2018 CLINICAL DATA:  85 year old male with diabetes well-known to me outpatient who has had a sore on his left foot recently being managed by the wound care center. Clinical exam suspicious for osteomyelitis. EXAM: MRI OF THE LEFT FOOT WITHOUT CONTRAST TECHNIQUE: Multiplanar, multisequence MR imaging of the left foot was performed. No intravenous contrast was administered. COMPARISON:  None. FINDINGS: Bones/Joint/Cartilage Soft tissue wound along the medial aspect of the first MTP joint. Small joint effusion of the first MTP joint. Marrow edema at the base of the first proximal phalanx and head of the  first metatarsal adjacent to the joint and soft tissue wound. No definite cortical destruction. 0.7 x 1.3 cm fluid collection along the plantar lateral aspect of the base of the great toe. Marrow edema and mild T1 hypointensity in the medial hallux sesamoid without cortical destruction. Marrow edema in the navicular, lateral cuneiform and to lesser extent medial  and middle cuneiforms likely reflecting reactive marrow changes secondary to degenerative change and developing Charcot joint versus stress reaction. No other marrow signal abnormality. No periosteal reaction or bone destruction. No acute fracture or dislocation. Mild osteoarthritis of the first MTP joint. Ligaments Collateral ligaments are intact.  Lisfranc ligament is intact. Muscles and Tendons Flexor, peroneal and extensor compartment tendons are intact. Soft tissue 14 x 7 mm cystic mass along the dorsal aspect of the navicular-cuneiform joint most consistent with a ganglion cyst. No soft tissue mass. Soft tissue edema along the dorsal aspect of the foot with skin thickening concerning for cellulitis. IMPRESSION: 1. Soft tissue wound along the medial aspect of the first MTP joint. Small joint effusion of the first MTP joint with mild marrow edema at the base of the first proximal phalanx and head of the first metatarsal adjacent to the joint and soft tissue wound. No definite cortical destruction. Differential considerations include mild septic arthritis versus reactive changes secondary to adjacent inflammation. 0.7 x 1.3 cm fluid collection along the plantar lateral aspect of the base of the great toe concerning for a small abscess. 2. No other areas of osteomyelitis. Electronically Signed   By: Kathreen Devoid   On: 06/10/2018 08:35   Dg Foot Complete Left  Result Date: 06/09/2018 CLINICAL DATA:  Left great toe nonhealing wound. History of diabetes. EXAM: LEFT FOOT - COMPLETE 3+ VIEW COMPARISON:  05/26/2018. FINDINGS: Again demonstrated is soft tissue swelling and mild irregularity medial to the 1st MTP joint. No soft tissue gas, bone destruction or periosteal reaction. Diffuse dorsal soft tissue swelling is slightly less prominent. Moderate-sized inferior posterior calcaneal spurs are again demonstrated. IMPRESSION: No radiographic evidence of osteomyelitis. Electronically Signed   By: Claudie Revering M.D.    On: 06/09/2018 16:56   ASSESSMENT AND PLAN:  [name]  is a 85 y.o. male with a known history of CAD, A. fib, hypertension, diabetes, chronic diastolic CHF, CVA, COPD with oxygen at night, hyperlipidemia, neuropathy and foot ulcer, etc. The patient presents the ED with worsening left foot infection.  The patient has been following up with wound care for left foot ulcer, which has been worsening for the past few days.  *Left foot diabetic ulcer infection with leukocytosis, ruled out osteomyelitis with negative MRI -Continue IV vancomycin and Zosyn. De-escalate antibiotics after wound culture results are obtained -MRI of left foot Soft tissue wound along the medial aspect of the first MTP joint. Small joint effusion of the first MTP joint with mild marrow edema at the base of the first proximal phalanx and head of the first metatarsal adjacent to the joint and soft tissue wound. No definite cortical destruction. -Status post excision and debridement at bedside by podiatry Dr.cline  *Peripheral vascular disease  -vascular consultappreciated. Patient will get left lower extremity angiogram on Monday after premedication on Sunday due to some allergy to contrast diet 25 years ago  *Diabetes.  Continue Levemir, insulin AC and start sliding scale.  *Diabetes neuropathy.  Continue current regimen.  *CKD.  Stage IV.  Stable.  *History of A. fib.  Continue Lopressor.  Hold Eliquis for possible procedure per Dr. Cleda Mccreedy.  Management plans discussed with the patient  CODE STATUS: Full code  DVT Prophylaxis: we started on eliquis after discussion with podiatry  TOTAL TIME TAKING CARE OF THIS PATIENT: 30 minutes.  >50% time spent on counselling and coordination of care  POSSIBLE D/C IN *2 to 3* DAYS, DEPENDING ON CLINICAL CONDITION.  Note: This dictation was prepared with Dragon dictation along with smaller phrase technology. Any transcriptional errors that result from this process are  unintentional.  Fritzi Mandes M.D on 06/10/2018 at 9:50 AM  Between 7am to 6pm - Pager - [phone]  After 6pm go to [email or web address] - password [email or web address] [facility]  Office  [phone]  CC: Primary care physician; Jearld Fenton, NPPatient ID: Shawn Grace., male   DOB: 12-03-1931, 85 y.o.   MRN: [id number]

## 2018-06-10 NOTE — Consult Note (Signed)
Reason for Consult: Diabetic LEFT forefoot infection and Moderate PVD Referring Physician: Dr. Janeece Riggers T Jerrett Baldinger. is an 85 y.o. male.  HPI: Patient with DM, chronic left foot wound, necrosis over left great toe, followed by wound care. Worsening appearance-evidence of abscess-debrided by Dr. Cleda Mccreedy at bedside. ABI performed 05/2017 revealed Left ABI- 0.75. Denies claudication as he does not ambulate much but noted left calf pain and soreness over the last month or so. Now requires revascularization.  Past Medical History:  Diagnosis Date  . Arthritis   . CAD    a. MI 01/29/1996 tx'd w/ TPA @ [facility]; b. Myoview 06/2005: EF 50%, scar @ apex, mild peri-infarct ischemia  . Cancer ([place])    skin  . Chronic atrial fibrillation    a. since 2006; b. on warfarin  . Chronic diastolic CHF (congestive heart failure) ([place])    a. echo 04/2006: EF lower limits of nl, mod LVH, mild aortic root dilatation, & mild MR, biatrial enlargement; b. echo 04/2013: EF 60%, mod dilated LA, mild MR & TR, mod pulm HTN w/ RV systolic pressure 53, c. echo 04/21/14: EF 55-60%, unable to exclude WMA, severely dilated LA 6.6 cm, nl RVSP, mildly dilated aortic root  . COPD (chronic obstructive pulmonary disease) ([facility])    oxygen prn at home  . CVA [id number]   x2  . DM   . Falls   . GERD (gastroesophageal reflux disease)   . History of kidney stones   . HYPERLIPIDEMIA   . HYPERTENSION   . Kidney stone    a. s/p left ureteral stenting 04/24/14  . Left arm weakness    limited movement. S/P fall injury  . Neuropathy of both feet   . Poor balance   . Wears dentures    full upper and lower    Past Surgical History:  Procedure Laterality Date  . BLADDER SURGERY     stent placement   . CARDIAC CATHETERIZATION  1997   [facility]  . CAROTID STENT INSERTION  1997  . CATARACT EXTRACTION W/PHACO Left 10/11/2017   Procedure: CATARACT EXTRACTION PHACO AND INTRAOCULAR LENS PLACEMENT ([employer]) COMPLICATED LEFT DIABETIC;  Surgeon:  Leandrew Koyanagi, MD;  Location: [facility];  Service: Ophthalmology;  Laterality: Left;  MALYUGIN Diabetic - insulin  . CATARACT EXTRACTION W/PHACO Right 11/30/2017   Procedure: CATARACT EXTRACTION PHACO AND INTRAOCULAR LENS PLACEMENT ([place]) COMPLICATED  RIGHT DIABETIC;  Surgeon: Leandrew Koyanagi, MD;  Location: [facility];  Service: Ophthalmology;  Laterality: Right;  Diabetic - insulin  . CIRCUMCISION  2016  . CORONARY ANGIOPLASTY  1997   s/p stent placement x 2   . CYSTOSCOPY W/ URETERAL STENT REMOVAL Left 10/09/2014   Procedure: CYSTOSCOPY WITH STENT REMOVAL;  Surgeon: Hollice Espy, MD;  Location: [facility];  Service: Urology;  Laterality: Left;  . CYSTOSCOPY WITH STENT PLACEMENT Left 10/09/2014   Procedure: CYSTOSCOPY WITH STENT PLACEMENT;  Surgeon: Hollice Espy, MD;  Location: [facility];  Service: Urology;  Laterality: Left;  . KIDNEY SURGERY  05/2013   s/p stent placement   . stents ureters Bilateral   . TONSILLECTOMY AND ADENOIDECTOMY  1959  . URETEROSCOPY WITH HOLMIUM LASER LITHOTRIPSY Left 10/09/2014   Procedure: URETEROSCOPY WITH HOLMIUM LASER LITHOTRIPSY;  Surgeon: Hollice Espy, MD;  Location: [facility];  Service: Urology;  Laterality: Left;    Family History  Problem Relation Age of Onset  . Heart disease Mother   . Heart disease Maternal Grandmother   . Diabetes Maternal Grandmother   .  Cancer Neg Hx   . Stroke Neg Hx     Social History:  reports that he quit smoking about 28 years ago. His smoking use included cigarettes. He has a 80.00 pack-year smoking history. He quit smokeless tobacco use about 28 years ago. He reports that he does not drink alcohol or use drugs.  Allergies:  Allergies  Allergen Reactions  . Contrast Media [Iodinated Diagnostic Agents] Shortness Of Breath  . Morphine And Related Other (See Comments)    Hallucinations   . Niacin And Related Dermatitis  . Other     Other reaction(s): SHORTNESS OF BREATH  . Amlodipine  Rash    Medications: I have reviewed the patient's current medications.  Results for orders placed or performed during the hospital encounter of 06/09/18 (from the past 48 hour(s))  Lactic acid, plasma     Status: Abnormal   Collection Time: 06/09/18  1:02 PM  Result Value Ref Range   Lactic Acid, Venous 2.1 (HH) 0.5 - 1.9 mmol/L    Comment: CRITICAL RESULT CALLED TO, READ BACK BY AND VERIFIED WITH COLLYN GILLESPIE 06/09/18 @ [place] Performed at [facility], [address] [place], [place] [address]   Comprehensive metabolic panel     Status: Abnormal   Collection Time: 06/09/18  1:02 PM  Result Value Ref Range   Sodium 140 135 - 145 mmol/L   Potassium 4.1 3.5 - 5.1 mmol/L   Chloride 100 98 - 111 mmol/L   CO2 29 22 - 32 mmol/L   Glucose, Bld 115 (H) 70 - 99 mg/dL   BUN 50 (H) 8 - 23 mg/dL   Creatinine, Ser 1.62 (H) 0.61 - 1.24 mg/dL   Calcium 9.4 8.9 - 10.3 mg/dL   Total Protein 7.2 6.5 - 8.1 g/dL   Albumin 3.3 (L) 3.5 - 5.0 g/dL   AST 14 (L) 15 - 41 U/L   ALT 14 0 - 44 U/L   Alkaline Phosphatase 67 38 - 126 U/L   Total Bilirubin 0.7 0.3 - 1.2 mg/dL   GFR calc non Af Amer 38 (L) >60 mL/min   GFR calc Af Amer 45 (L) >60 mL/min   Anion gap 11 5 - 15    Comment: Performed at [facility], [address] [place], [place] [address]  CBC with Differential     Status: Abnormal   Collection Time: 06/09/18  1:02 PM  Result Value Ref Range   WBC 20.4 (H) 4.0 - 10.5 K/uL   RBC 4.69 4.22 - 5.81 MIL/uL   Hemoglobin 13.6 13.0 - 17.0 g/dL   HCT 42.9 39.0 - 52.0 %   MCV 91.5 80.0 - 100.0 fL   MCH 29.0 26.0 - 34.0 pg   MCHC 31.7 30.0 - 36.0 g/dL   RDW 16.9 (H) 11.5 - 15.5 %   Platelets 277 150 - 400 K/uL   nRBC 0.0 0.0 - 0.2 %   Neutrophils Relative % 80 %   Neutro Abs 16.3 (H) 1.7 - 7.7 K/uL   Lymphocytes Relative 11 %   Lymphs Abs 2.3 0.7 - 4.0 K/uL   Monocytes Relative 8 %   Monocytes Absolute 1.6 (H) 0.1 - 1.0 K/uL   Eosinophils Relative 0 %    Eosinophils Absolute 0.0 0.0 - 0.5 K/uL   Basophils Relative 0 %   Basophils Absolute 0.1 0.0 - 0.1 K/uL   Immature Granulocytes 1 %   Abs Immature Granulocytes 0.15 (H) 0.00 - 0.07 K/uL  Comment: Performed at [facility], [address] [place], [place] [address]  Protime-INR     Status: None   Collection Time: 06/09/18  1:05 PM  Result Value Ref Range   Prothrombin Time 15.0 11.4 - 15.2 seconds   INR 1.2 0.8 - 1.2    Comment: (NOTE) INR goal varies based on device and disease states. Performed at [facility], [address] [place], [place] [address]   APTT     Status: None   Collection Time: 06/09/18  1:05 PM  Result Value Ref Range   aPTT 33 24 - 36 seconds    Comment: Performed at [facility], [address] [place], [place] [address]  Glucose, capillary     Status: Abnormal   Collection Time: 06/09/18  4:09 PM  Result Value Ref Range   Glucose-Capillary 128 (H) 70 - 99 mg/dL  Blood culture (routine x 2)     Status: None (Preliminary result)   Collection Time: 06/09/18  5:28 PM  Result Value Ref Range   Specimen Description BLOOD LFA    Special Requests      BOTTLES DRAWN AEROBIC AND ANAEROBIC Blood Culture results may not be optimal due to an excessive volume of blood received in culture bottles   Culture      NO GROWTH < 12 HOURS Performed at [facility], [address], [place], [place] [address]    Report Status PENDING   Blood culture (routine x 2)     Status: None (Preliminary result)   Collection Time: 06/09/18  5:28 PM  Result Value Ref Range   Specimen Description BLOOD LAC    Special Requests      BOTTLES DRAWN AEROBIC AND ANAEROBIC Blood Culture results may not be optimal due to an excessive volume of blood received in culture bottles   Culture      NO GROWTH < 12 HOURS Performed at [facility], [address] [place], [place] [address]    Report Status PENDING   Lactic acid, plasma     Status:  Abnormal   Collection Time: 06/09/18  5:29 PM  Result Value Ref Range   Lactic Acid, Venous 2.8 (HH) 0.5 - 1.9 mmol/L    Comment: CRITICAL RESULT CALLED TO, READ BACK BY AND VERIFIED WITH DEE MCCLAIN 06/09/18 @ [place] Performed at [facility], [address] [place],  [address]   Glucose, capillary     Status: Abnormal   Collection Time: 06/09/18  9:12 PM  Result Value Ref Range   Glucose-Capillary 212 (H) 70 - 99 mg/dL  Basic metabolic panel     Status: Abnormal   Collection Time: 06/10/18  6:47 AM  Result Value Ref Range   Sodium 140 135 - 145 mmol/L   Potassium 3.0 (L) 3.5 - 5.1 mmol/L   Chloride 103 98 - 111 mmol/L   CO2 27 22 - 32 mmol/L   Glucose, Bld 48 (L) 70 - 99 mg/dL   BUN 41 (H) 8 - 23 mg/dL   Creatinine, Ser 1.29 (H) 0.61 - 1.24 mg/dL   Calcium 9.2 8.9 - 10.3 mg/dL   GFR calc non Af Amer 51 (L) >60 mL/min   GFR calc Af Amer 59 (L) >60 mL/min   Anion gap 10 5 - 15    Comment: Performed at [facility], [address], [place],  [address]  CBC     Status: Abnormal   Collection Time: 06/10/18  6:47 AM  Result Value Ref Range   WBC 20.5 (H) 4.0 - 10.5 K/uL   RBC 4.57 4.22 - 5.81 MIL/uL   Hemoglobin 13.4 13.0 - 17.0 g/dL   HCT 41.2 39.0 - 52.0 %   MCV 90.2 80.0 - 100.0 fL   MCH 29.3 26.0 - 34.0 pg   MCHC 32.5 30.0 - 36.0 g/dL   RDW 17.0 (H) 11.5 - 15.5 %   Platelets 256 150 - 400 K/uL   nRBC 0.0 0.0 - 0.2 %    Comment: Performed at [facility], [address] [place], [place] [address]  Glucose, capillary     Status: Abnormal   Collection Time: 06/10/18  7:52 AM  Result Value Ref Range   Glucose-Capillary 59 (L) 70 - 99 mg/dL   Comment 1 Notify RN   Glucose, capillary     Status: Abnormal   Collection Time: 06/10/18  8:25 AM  Result Value Ref Range   Glucose-Capillary 108 (H) 70 - 99 mg/dL   Comment 1 Notify RN     Dg Foot Complete Left  Result Date: 06/09/2018 CLINICAL DATA:  Left great toe nonhealing  wound. History of diabetes. EXAM: LEFT FOOT - COMPLETE 3+ VIEW COMPARISON:  05/26/2018. FINDINGS: Again demonstrated is soft tissue swelling and mild irregularity medial to the 1st MTP joint. No soft tissue gas, bone destruction or periosteal reaction. Diffuse dorsal soft tissue swelling is slightly less prominent. Moderate-sized inferior posterior calcaneal spurs are again demonstrated. IMPRESSION: No radiographic evidence of osteomyelitis. Electronically Signed   By: Claudie Revering M.D.   On: 06/09/2018 16:56    Review of Systems  Constitutional: Negative for chills and fever.  Eyes: Negative.   Respiratory: Positive for shortness of breath. Negative for cough and hemoptysis.   Cardiovascular: Positive for orthopnea. Negative for chest pain, claudication and leg swelling.  Gastrointestinal: Negative for abdominal pain.  Musculoskeletal:       LEFT Calf pain  Skin: Negative.   Neurological: Negative for weakness.   Blood pressure 115/64, pulse 87, temperature 98.2 F (36.8 C), temperature source Oral, resp. rate 18, height 5\' 6"  (1.676 m), weight 85.3 kg, SpO2 90 %. Physical Exam  Nursing note and vitals reviewed. Constitutional: He is oriented to person, place, and time. He appears well-developed and well-nourished. No distress.  Neck: Normal range of motion. Neck supple. No JVD present.  Cardiovascular: Normal rate.  Pedal pulses not palpable Palpable femoral pulses bilaterally  Respiratory: Effort normal and breath sounds normal. No stridor. No respiratory distress. He has no wheezes.  GI: Soft. He exhibits no distension. There is no abdominal tenderness. There is no rebound.  Musculoskeletal:        General: No tenderness or edema.     Comments: LEFT foot- dressing in place- s/p Podiatry debridement. Erythema over lower leg, non tender  Neurological: He is alert and oriented to person, place, and time.  Skin: Skin is warm.    Assessment/Plan: Patient with Moderate PVD- ABI 0.75 a  year ago. DM with Left foot infection.  Will plan for potential angiogram with intervention of Left lower extremity on Monday per Dr. Lucky Cowboy.  He has an allergy to contrast dye- per son he had what sounds like an MI over 25 years ago during a cardiac cath which was attributed to the contrast administration.  I explained that he would need to be premedicated/hydrated the day prior and before the procedure.  Will discuss further tomorrow with patient and son.  Sala Tague A 06/10/2018, 8:37 AM

## 2018-06-11 LAB — CBC
HCT: 38.6 % — ABNORMAL LOW (ref 39.0–52.0)
Hemoglobin: 12.4 g/dL — ABNORMAL LOW (ref 13.0–17.0)
MCH: 29.1 pg (ref 26.0–34.0)
MCHC: 32.1 g/dL (ref 30.0–36.0)
MCV: 90.6 fL (ref 80.0–100.0)
PLATELETS: 218 10*3/uL (ref 150–400)
RBC: 4.26 MIL/uL (ref 4.22–5.81)
RDW: 17.2 % — ABNORMAL HIGH (ref 11.5–15.5)
WBC: 15.4 10*3/uL — ABNORMAL HIGH (ref 4.0–10.5)
nRBC: 0 % (ref 0.0–0.2)

## 2018-06-11 LAB — GLUCOSE, CAPILLARY
GLUCOSE-CAPILLARY: 49 mg/dL — AB (ref 70–99)
GLUCOSE-CAPILLARY: 222 mg/dL — AB (ref 70–99)
Glucose-Capillary: 267 mg/dL — ABNORMAL HIGH (ref 70–99)
Glucose-Capillary: 283 mg/dL — ABNORMAL HIGH (ref 70–99)
Glucose-Capillary: 248 mg/dL — ABNORMAL HIGH (ref 70–99)

## 2018-06-11 LAB — CREATININE, SERUM
Creatinine, Ser: 1.52 mg/dL — ABNORMAL HIGH (ref 0.61–1.24)
GFR calc Af Amer: 48 mL/min — ABNORMAL LOW (ref >60)
GFR calc non Af Amer: 41 mL/min — ABNORMAL LOW (ref >60)

## 2018-06-11 LAB — POTASSIUM: Potassium: 2.9 mmol/L — ABNORMAL LOW (ref 3.5–5.1)

## 2018-06-11 LAB — MAGNESIUM: Magnesium: 2.5 mg/dL — ABNORMAL HIGH (ref 1.7–2.4)

## 2018-06-11 MED ORDER — DIPHENHYDRAMINE HCL 25 MG PO CAPS
50.0000 mg | ORAL_CAPSULE | Freq: Once | ORAL | Status: AC
  Administered 2018-06-12: 50 mg via ORAL
  Filled 2018-06-11: qty 2

## 2018-06-11 MED ORDER — DIPHENHYDRAMINE HCL 25 MG PO CAPS: 50.0000 mg | ORAL_CAPSULE | Freq: Once | ORAL | Status: DC

## 2018-06-11 MED ORDER — POTASSIUM CHLORIDE CRYS ER 20 MEQ PO TBCR
40.0000 meq | EXTENDED_RELEASE_TABLET | Freq: Two times a day (BID) | ORAL | Status: DC
  Administered 2018-06-11: 40 meq via ORAL
  Filled 2018-06-11: qty 2

## 2018-06-11 MED ORDER — VANCOMYCIN HCL IN DEXTROSE 1-5 GM/200ML-% IV SOLN
1000.0000 mg | INTRAVENOUS | Status: DC
  Administered 2018-06-11 – 2018-06-13 (×2): 1000 mg via INTRAVENOUS
  Filled 2018-06-11 (×2): qty 200

## 2018-06-11 MED ORDER — POTASSIUM CHLORIDE CRYS ER 20 MEQ PO TBCR
40.0000 meq | EXTENDED_RELEASE_TABLET | ORAL | Status: AC
  Administered 2018-06-11 (×2): 40 meq via ORAL
  Filled 2018-06-11 (×2): qty 2

## 2018-06-11 MED ORDER — SODIUM CHLORIDE 0.9 % IV SOLN
INTRAVENOUS | Status: DC
  Administered 2018-06-13: 18:00:00 via INTRAVENOUS

## 2018-06-11 MED ORDER — PREDNISONE 50 MG PO TABS
50.0000 mg | ORAL_TABLET | Freq: Four times a day (QID) | ORAL | Status: AC
  Administered 2018-06-11 – 2018-06-12 (×3): 50 mg via ORAL
  Filled 2018-06-11 (×3): qty 1

## 2018-06-11 MED ORDER — SODIUM CHLORIDE 0.9 % IV SOLN
INTRAVENOUS | Status: DC
  Administered 2018-06-11 – 2018-06-12 (×2): via INTRAVENOUS

## 2018-06-11 MED ORDER — INSULIN DETEMIR 100 UNIT/ML ~~LOC~~ SOLN
25.0000 [IU] | Freq: Every day | SUBCUTANEOUS | Status: DC
  Administered 2018-06-11 – 2018-06-12 (×2): 25 [IU] via SUBCUTANEOUS
  Filled 2018-06-11 (×4): qty 0.25

## 2018-06-11 MED ORDER — POTASSIUM CHLORIDE CRYS ER 20 MEQ PO TBCR
80.0000 meq | EXTENDED_RELEASE_TABLET | Freq: Two times a day (BID) | ORAL | Status: DC
  Administered 2018-06-12 – 2018-06-16 (×8): 80 meq via ORAL
  Filled 2018-06-11 (×8): qty 4

## 2018-06-11 MED ORDER — INSULIN ASPART 100 UNIT/ML ~~LOC~~ SOLN
4.0000 [IU] | Freq: Three times a day (TID) | SUBCUTANEOUS | Status: DC
  Administered 2018-06-11 – 2018-06-16 (×12): 4 [IU] via SUBCUTANEOUS
  Filled 2018-06-11 (×13): qty 1

## 2018-06-11 NOTE — Progress Notes (Signed)
[facility] [place] NAME: [name]    MR#:  [id number]  DATE OF BIRTH:  March 27, 1932  SUBJECTIVE:   Patient denies any complaints. Son in the room REVIEW OF SYSTEMS:   Review of Systems  Constitutional: Negative for chills, fever and weight loss.  HENT: Negative for ear discharge, ear pain and nosebleeds.   Eyes: Negative for blurred vision, pain and discharge.  Respiratory: Negative for sputum production, shortness of breath, wheezing and stridor.   Cardiovascular: Negative for chest pain, palpitations, orthopnea and PND.  Gastrointestinal: Negative for abdominal pain, diarrhea, nausea and vomiting.  Genitourinary: Negative for frequency and urgency.  Musculoskeletal: Negative for back pain and joint pain.  Neurological: Negative for sensory change, speech change, focal weakness and weakness.  Psychiatric/Behavioral: Negative for depression and hallucinations. The patient is not nervous/anxious.    Tolerating Diet:yes Tolerating PT: pending  DRUG ALLERGIES:   Allergies  Allergen Reactions  . Contrast Media [Iodinated Diagnostic Agents] Shortness Of Breath  . Morphine And Related Other (See Comments)    Hallucinations   . Niacin And Related Dermatitis  . Other     Other reaction(s): SHORTNESS OF BREATH  . Amlodipine Rash    VITALS:  Blood pressure (!) 130/58, pulse 90, temperature 98.4 F (36.9 C), temperature source Oral, resp. rate 18, height 5\' 6"  (1.676 m), weight 85.3 kg, SpO2 97 %.  PHYSICAL EXAMINATION:   Physical Exam  GENERAL:  85 y.o.-year-old patient lying in the bed with no acute distress.  EYES: Pupils equal, round, reactive to light and accommodation. No scleral icterus. Extraocular muscles intact.  HEENT: Head atraumatic, normocephalic. Oropharynx and nasopharynx clear.  NECK:  Supple, no jugular venous distention. No thyroid enlargement, no tenderness.  LUNGS: Normal breath sounds bilaterally,  no wheezing, rales, rhonchi. No use of accessory muscles of respiration.  CARDIOVASCULAR: S1, S2 normal. No murmurs, rubs, or gallops.  ABDOMEN: Soft, nontender, nondistended. Bowel sounds present. No organomegaly or mass.  EXTREMITIES: No cyanosis, clubbing or edema b/l.   Left foot dressing + NEUROLOGIC: Cranial nerves II through XII are intact. No focal Motor or sensory deficits b/l.   PSYCHIATRIC:  patient is alert and oriented x 3.  SKIN: No obvious rash, lesion, or ulcer.   LABORATORY PANEL:  CBC Recent Labs  Lab 06/11/18 0443  WBC 15.4*  HGB 12.4*  HCT 38.6*  PLT 218    Chemistries  Recent Labs  Lab 06/09/18 1302 06/10/18 0647  NA 140 140  K 4.1 3.0*  CL 100 103  CO2 29 27  GLUCOSE 115* 48*  BUN 50* 41*  CREATININE 1.62* 1.29*  CALCIUM 9.4 9.2  AST 14*  --   ALT 14  --   ALKPHOS 67  --   BILITOT 0.7  --    Cardiac Enzymes No results for input(s): TROPONINI in the last 168 hours. RADIOLOGY:  Mr Foot Left Wo Contrast  Result Date: 06/10/2018 CLINICAL DATA:  85 year old male with diabetes well-known to me outpatient who has had a sore on his left foot recently being managed by the wound care center. Clinical exam suspicious for osteomyelitis. EXAM: MRI OF THE LEFT FOOT WITHOUT CONTRAST TECHNIQUE: Multiplanar, multisequence MR imaging of the left foot was performed. No intravenous contrast was administered. COMPARISON:  None. FINDINGS: Bones/Joint/Cartilage Soft tissue wound along the medial aspect of the first MTP joint. Small joint effusion of the first MTP joint. Marrow edema at the base of the first proximal  phalanx and head of the first metatarsal adjacent to the joint and soft tissue wound. No definite cortical destruction. 0.7 x 1.3 cm fluid collection along the plantar lateral aspect of the base of the great toe. Marrow edema and mild T1 hypointensity in the medial hallux sesamoid without cortical destruction. Marrow edema in the navicular, lateral cuneiform and  to lesser extent medial and middle cuneiforms likely reflecting reactive marrow changes secondary to degenerative change and developing Charcot joint versus stress reaction. No other marrow signal abnormality. No periosteal reaction or bone destruction. No acute fracture or dislocation. Mild osteoarthritis of the first MTP joint. Ligaments Collateral ligaments are intact.  Lisfranc ligament is intact. Muscles and Tendons Flexor, peroneal and extensor compartment tendons are intact. Soft tissue 14 x 7 mm cystic mass along the dorsal aspect of the navicular-cuneiform joint most consistent with a ganglion cyst. No soft tissue mass. Soft tissue edema along the dorsal aspect of the foot with skin thickening concerning for cellulitis. IMPRESSION: 1. Soft tissue wound along the medial aspect of the first MTP joint. Small joint effusion of the first MTP joint with mild marrow edema at the base of the first proximal phalanx and head of the first metatarsal adjacent to the joint and soft tissue wound. No definite cortical destruction. Differential considerations include mild septic arthritis versus reactive changes secondary to adjacent inflammation. 0.7 x 1.3 cm fluid collection along the plantar lateral aspect of the base of the great toe concerning for a small abscess. 2. No other areas of osteomyelitis. Electronically Signed   By: Kathreen Devoid   On: 06/10/2018 08:35   Dg Foot Complete Left  Result Date: 06/09/2018 CLINICAL DATA:  Left great toe nonhealing wound. History of diabetes. EXAM: LEFT FOOT - COMPLETE 3+ VIEW COMPARISON:  05/26/2018. FINDINGS: Again demonstrated is soft tissue swelling and mild irregularity medial to the 1st MTP joint. No soft tissue gas, bone destruction or periosteal reaction. Diffuse dorsal soft tissue swelling is slightly less prominent. Moderate-sized inferior posterior calcaneal spurs are again demonstrated. IMPRESSION: No radiographic evidence of osteomyelitis. Electronically Signed    By: Claudie Revering M.D.   On: 06/09/2018 16:56   ASSESSMENT AND PLAN:  [name]  is a 85 y.o. male with a known history of CAD, A. fib, hypertension, diabetes, chronic diastolic CHF, CVA, COPD with oxygen at night, hyperlipidemia, neuropathy and foot ulcer, etc. The patient presents the ED with worsening left foot infection.  The patient has been following up with wound care for left foot ulcer, which has been worsening for the past few days.  *Left foot diabetic ulcer infection with leukocytosis, ruled out osteomyelitis with negative MRI -Continue IV vancomycin and Zosyn. De-escalate antibiotics after wound culture results are obtained -MRI of left foot Soft tissue wound along the medial aspect of the first MTP joint. Small joint effusion of the first MTP joint with mild marrow edema at the base of the first proximal phalanx and head of the first metatarsal adjacent to the joint and soft tissue wound. No definite cortical destruction. -Status post excision and debridement at bedside by podiatry Dr.cline  *Peripheral vascular disease  -vascular consultappreciated. Patient will get left lower extremity angiogram on Monday after premedication on Sunday due to some allergy to contrast dye 25 years ago  *Diabetes.  Continue Levemir, insulin AC and start sliding scale. Sugars low in am--insulin changes made  *Diabetes neuropathy.  Continue current regimen.  *CKD.  Stage IV.  Stable.  *History of A. fib.  Continue Lopressor.  Hold Eliquis for possible procedure per Dr. Cleda Mccreedy.  Management plans discussed with the patient  CODE STATUS: Full code  DVT Prophylaxis: we started on eliquis after discussion with podiatry  TOTAL TIME TAKING CARE OF THIS PATIENT: 30 minutes.  >50% time spent on counselling and coordination of care  POSSIBLE D/C IN *2 to 3* DAYS, DEPENDING ON CLINICAL CONDITION.  Note: This dictation was prepared with Dragon dictation along with smaller phrase technology.  Any transcriptional errors that result from this process are unintentional.  Fritzi Mandes M.D on 06/11/2018 at 8:14 AM  Between 7am to 6pm - Pager - [phone]  After 6pm go to [email or web address] - password [email or web address] [facility]  Office  [phone]  CC: Primary care physician; Jearld Fenton, NPPatient ID: [name]., male   DOB: 09-Aug-1931, 85 y.o.   MRN: [id number]

## 2018-06-11 NOTE — Progress Notes (Signed)
Subjective/Chief Complaint: Doing OK. Denies Pain.   Objective: Vital signs in last 24 hours: Temp:  [97.7 F (36.5 C)-99 F (37.2 C)] 97.7 F (36.5 C) (02/29 2305) Pulse Rate:  [61-99] 61 (02/29 2305) Resp:  [18] 18 (02/29 2305) BP: (96-110)/(50-54) 110/50 (02/29 2305) SpO2:  [91 %-94 %] 91 % (02/29 2305)    Intake/Output from previous day: 02/29 0701 - 03/01 0700 In: 200 [IV Piggyback:200] Out: 1000 [Urine:1000] Intake/Output this shift: No intake/output data recorded.  General appearance: alert and no distress Resp: clear to auscultation bilaterally Cardio: regular rate and rhythm Extremities: LEFT lower extremity- improved erythema. Foot wrapped per Podiatry.  Lab Results:  Recent Labs    06/10/18 0647 06/11/18 0443  WBC 20.5* 15.4*  HGB 13.4 12.4*  HCT 41.2 38.6*  PLT 256 218   BMET Recent Labs    06/09/18 1302 06/10/18 0647  NA 140 140  K 4.1 3.0*  CL 100 103  CO2 29 27  GLUCOSE 115* 48*  BUN 50* 41*  CREATININE 1.62* 1.29*  CALCIUM 9.4 9.2   PT/INR Recent Labs    06/09/18 1305  LABPROT 15.0  INR 1.2   ABG No results for input(s): PHART, HCO3 in the last 72 hours.  Invalid input(s): PCO2, PO2  Studies/Results: Mr Foot Left Wo Contrast  Result Date: 06/10/2018 CLINICAL DATA:  85 year old male with diabetes well-known to me outpatient who has had a sore on his left foot recently being managed by the wound care center. Clinical exam suspicious for osteomyelitis. EXAM: MRI OF THE LEFT FOOT WITHOUT CONTRAST TECHNIQUE: Multiplanar, multisequence MR imaging of the left foot was performed. No intravenous contrast was administered. COMPARISON:  None. FINDINGS: Bones/Joint/Cartilage Soft tissue wound along the medial aspect of the first MTP joint. Small joint effusion of the first MTP joint. Marrow edema at the base of the first proximal phalanx and head of the first metatarsal adjacent to the joint and soft tissue wound. No definite cortical  destruction. 0.7 x 1.3 cm fluid collection along the plantar lateral aspect of the base of the great toe. Marrow edema and mild T1 hypointensity in the medial hallux sesamoid without cortical destruction. Marrow edema in the navicular, lateral cuneiform and to lesser extent medial and middle cuneiforms likely reflecting reactive marrow changes secondary to degenerative change and developing Charcot joint versus stress reaction. No other marrow signal abnormality. No periosteal reaction or bone destruction. No acute fracture or dislocation. Mild osteoarthritis of the first MTP joint. Ligaments Collateral ligaments are intact.  Lisfranc ligament is intact. Muscles and Tendons Flexor, peroneal and extensor compartment tendons are intact. Soft tissue 14 x 7 mm cystic mass along the dorsal aspect of the navicular-cuneiform joint most consistent with a ganglion cyst. No soft tissue mass. Soft tissue edema along the dorsal aspect of the foot with skin thickening concerning for cellulitis. IMPRESSION: 1. Soft tissue wound along the medial aspect of the first MTP joint. Small joint effusion of the first MTP joint with mild marrow edema at the base of the first proximal phalanx and head of the first metatarsal adjacent to the joint and soft tissue wound. No definite cortical destruction. Differential considerations include mild septic arthritis versus reactive changes secondary to adjacent inflammation. 0.7 x 1.3 cm fluid collection along the plantar lateral aspect of the base of the great toe concerning for a small abscess. 2. No other areas of osteomyelitis. Electronically Signed   By: Kathreen Devoid   On: 06/10/2018 08:35   Dg  Foot Complete Left  Result Date: 06/09/2018 CLINICAL DATA:  Left great toe nonhealing wound. History of diabetes. EXAM: LEFT FOOT - COMPLETE 3+ VIEW COMPARISON:  05/26/2018. FINDINGS: Again demonstrated is soft tissue swelling and mild irregularity medial to the 1st MTP joint. No soft tissue gas,  bone destruction or periosteal reaction. Diffuse dorsal soft tissue swelling is slightly less prominent. Moderate-sized inferior posterior calcaneal spurs are again demonstrated. IMPRESSION: No radiographic evidence of osteomyelitis. Electronically Signed   By: Claudie Revering M.D.   On: 06/09/2018 16:56    Anti-infectives: Anti-infectives (From admission, onward)   Start     Dose/Rate Route Frequency Ordered Stop   06/11/18 2000  vancomycin (VANCOCIN) 1,750 mg in sodium chloride 0.9 % 500 mL IVPB     1,750 mg 250 mL/hr over 120 Minutes Intravenous Every 48 hours 06/09/18 1841     06/09/18 2200  piperacillin-tazobactam (ZOSYN) IVPB 3.375 g  Status:  Discontinued     3.375 g 12.5 mL/hr over 240 Minutes Intravenous Every 12 hours 06/09/18 1819 06/09/18 2016   06/09/18 2200  piperacillin-tazobactam (ZOSYN) IVPB 3.375 g     3.375 g 12.5 mL/hr over 240 Minutes Intravenous Every 8 hours 06/09/18 2016     06/09/18 1845  vancomycin (VANCOCIN) IVPB 1000 mg/200 mL premix     1,000 mg 200 mL/hr over 60 Minutes Intravenous  Once 06/09/18 1832     06/09/18 1615  vancomycin (VANCOCIN) IVPB 1000 mg/200 mL premix     1,000 mg 200 mL/hr over 60 Minutes Intravenous  Once 06/09/18 1612 06/09/18 1838      Assessment/Plan: Patient with Moderate PVD- ABI 0.75 a year ago. DM with Left foot infection  Will Plan for angiogram tomorrow per Dr. Lucky Cowboy.  Will premedicate this evening for contrast allergy. Gentle hydration. Further discussion with patient regarding plan. Understanding expressed.  LOS: 2 days    Evaristo Bury 06/11/2018

## 2018-06-11 NOTE — Progress Notes (Signed)
Notified Dr. Posey Pronto that patient and family states he takes at total of 80 meq potassium daily. Order received from Dr. Posey Pronto to resume home potassium

## 2018-06-11 NOTE — Progress Notes (Signed)
Pharmacy Electrolyte Monitoring Consult:  Pharmacy consulted to assist in monitoring and replacing electrolytes in this 85 y.o. male admitted on 06/09/2018 with No chief complaint on file.   Labs:  Sodium (mmol/L)  Date Value  06/10/2018 140  02/21/2015 143  08/05/2014 137   Potassium (mmol/L)  Date Value  06/11/2018 2.9 (L)  08/05/2014 3.6   Magnesium (mg/dL)  Date Value  06/11/2018 2.5 (H)  04/20/2014 2.1   Phosphorus (mg/dL)  Date Value  05/13/2016 3.6  04/20/2014 3.0   Calcium (mg/dL)  Date Value  06/10/2018 9.2   Calcium, Total (mg/dL)  Date Value  08/05/2014 8.6 (L)   Albumin (g/dL)  Date Value  06/09/2018 3.3 (L)  05/02/2014 2.4 (L)   2/29 K 3.0, pt received KDur 40 mEq po x1  Assessment/Plan: Per consult, MD requesting oral replacement, Hx Afib Ordered add on Mag, SCr, and K K 2.9, Mag 2.5, SCr 1.52, CrCl 37 ml/min K lower today despite replacement yesterday Will order KCl 40 mEq PO x2 doses and recheck BMET in AM    [name] L 06/11/2018 9:05 AM

## 2018-06-11 NOTE — Consult Note (Signed)
Pharmacy Antibiotic Note  [name]. is a 85 y.o. male admitted on 06/09/2018 with wound infection.  Pharmacy has been consulted for Vancomycin/Zosyn dosing.  Plan: Zosyn 3.375g q 8h (extended 4-hour infusion)  Vancomycin - total of 2g loading dose (1g x 2)   3/1: Vancomycin 1000 mg IV Q 36 hrs. Goal AUC 400-550. Expected AUC: 496 SCr used: 1.52 Cmin 11.5   Height: 5\' 6"  (167.6 cm) Weight: 188 lb 0.8 oz (85.3 kg) IBW/kg (Calculated) : 63.8  Temp (24hrs), Avg:99.2 F (37.3 C), Min:97.7 F (36.5 C), Max:101.6 F (38.7 C)  Recent Labs  Lab 06/09/18 1302 06/09/18 1729 06/10/18 0647 06/11/18 0443  WBC 20.4*  --  20.5* 15.4*  CREATININE 1.62*  --  1.29* 1.52*  LATICACIDVEN 2.1* 2.8*  --   --     Estimated Creatinine Clearance: 37 mL/min (A) (by C-G formula based on SCr of 1.52 mg/dL (H)).    Allergies  Allergen Reactions  . Contrast Media [Iodinated Diagnostic Agents] Shortness Of Breath  . Morphine And Related Other (See Comments)    Hallucinations   . Niacin And Related Dermatitis  . Other     Other reaction(s): SHORTNESS OF BREATH  . Amlodipine Rash    Antimicrobials this admission: Vancomycin 2/28 >>  Zosyn 2/28 >>   Dose adjustments this admission: None  Microbiology results: 2/28 BCx: pending  Thank you for allowing pharmacy to be a part of this patient's care.  Rocky Morel, PharmD, BCPS Clinical Pharmacist 06/11/2018 4:06 PM

## 2018-06-11 NOTE — Progress Notes (Signed)
Notified Dr. Posey Pronto of MEWS score. Order received for normal saline at 75 cc/hr

## 2018-06-12 ENCOUNTER — Encounter: Payer: Self-pay | Admitting: Anesthesiology

## 2018-06-12 ENCOUNTER — Encounter
Admission: EM | Disposition: A | Discharge status: Skilled Nursing Facility | Payer: Self-pay | Source: Home / Self Care | Attending: Internal Medicine

## 2018-06-12 DIAGNOSIS — J449 Chronic obstructive pulmonary disease, unspecified: Secondary | ICD-10-CM

## 2018-06-12 DIAGNOSIS — L97528 Non-pressure chronic ulcer of other part of left foot with other specified severity: Secondary | ICD-10-CM

## 2018-06-12 DIAGNOSIS — Z7901 Long term (current) use of anticoagulants: Secondary | ICD-10-CM

## 2018-06-12 DIAGNOSIS — Z95820 Peripheral vascular angioplasty status with implants and grafts: Secondary | ICD-10-CM

## 2018-06-12 DIAGNOSIS — Z87891 Personal history of nicotine dependence: Secondary | ICD-10-CM

## 2018-06-12 DIAGNOSIS — I482 Chronic atrial fibrillation, unspecified: Secondary | ICD-10-CM

## 2018-06-12 DIAGNOSIS — I96 Gangrene, not elsewhere classified: Secondary | ICD-10-CM

## 2018-06-12 DIAGNOSIS — I509 Heart failure, unspecified: Secondary | ICD-10-CM

## 2018-06-12 DIAGNOSIS — I70245 Atherosclerosis of native arteries of left leg with ulceration of other part of foot: Secondary | ICD-10-CM

## 2018-06-12 DIAGNOSIS — Z888 Allergy status to other drugs, medicaments and biological substances status: Secondary | ICD-10-CM

## 2018-06-12 DIAGNOSIS — I110 Hypertensive heart disease with heart failure: Secondary | ICD-10-CM

## 2018-06-12 DIAGNOSIS — Z79899 Other long term (current) drug therapy: Secondary | ICD-10-CM

## 2018-06-12 DIAGNOSIS — L97529 Non-pressure chronic ulcer of other part of left foot with unspecified severity: Secondary | ICD-10-CM

## 2018-06-12 DIAGNOSIS — I7092 Chronic total occlusion of artery of the extremities: Secondary | ICD-10-CM

## 2018-06-12 DIAGNOSIS — Z885 Allergy status to narcotic agent status: Secondary | ICD-10-CM

## 2018-06-12 DIAGNOSIS — Z794 Long term (current) use of insulin: Secondary | ICD-10-CM

## 2018-06-12 DIAGNOSIS — Z91041 Radiographic dye allergy status: Secondary | ICD-10-CM

## 2018-06-12 HISTORY — PX: LOWER EXTREMITY ANGIOGRAPHY: CATH118251

## 2018-06-12 LAB — BASIC METABOLIC PANEL
ANION GAP: 12 (ref 5–15)
BUN: 34 mg/dL — ABNORMAL HIGH (ref 8–23)
CO2: 25 mmol/L (ref 22–32)
Calcium: 8.9 mg/dL (ref 8.9–10.3)
Chloride: 99 mmol/L (ref 98–111)
Creatinine, Ser: 1.42 mg/dL — ABNORMAL HIGH (ref 0.61–1.24)
GFR calc non Af Amer: 45 mL/min — ABNORMAL LOW (ref >60)
GFR, EST AFRICAN AMERICAN: 52 mL/min — AB (ref >60)
Glucose, Bld: 365 mg/dL — ABNORMAL HIGH (ref 70–99)
Potassium: 3.9 mmol/L (ref 3.5–5.1)
SODIUM: 136 mmol/L (ref 135–145)

## 2018-06-12 LAB — GLUCOSE, CAPILLARY
GLUCOSE-CAPILLARY: 447 mg/dL — AB (ref 70–99)
Glucose-Capillary: 322 mg/dL — ABNORMAL HIGH (ref 70–99)
Glucose-Capillary: 319 mg/dL — ABNORMAL HIGH (ref 70–99)
Glucose-Capillary: 318 mg/dL — ABNORMAL HIGH (ref 70–99)
Glucose-Capillary: 374 mg/dL — ABNORMAL HIGH (ref 70–99)

## 2018-06-12 SURGERY — LOWER EXTREMITY ANGIOGRAPHY
Anesthesia: Moderate Sedation | Laterality: Left

## 2018-06-12 MED ORDER — ASPIRIN EC 81 MG PO TBEC
81.0000 mg | DELAYED_RELEASE_TABLET | Freq: Every day | ORAL | Status: DC
  Administered 2018-06-12 – 2018-06-16 (×3): 81 mg via ORAL
  Filled 2018-06-12 (×3): qty 1

## 2018-06-12 MED ORDER — HEPARIN SODIUM (PORCINE) 5000 UNIT/ML IJ SOLN: 5000.0000 [IU] | Freq: Three times a day (TID) | INTRAMUSCULAR | Status: DC

## 2018-06-12 MED ORDER — HEPARIN SODIUM (PORCINE) 1000 UNIT/ML IJ SOLN
INTRAMUSCULAR | Status: AC
  Filled 2018-06-12: qty 1

## 2018-06-12 MED ORDER — APIXABAN 2.5 MG PO TABS: 2.5000 mg | ORAL_TABLET | Freq: Two times a day (BID) | ORAL | Status: DC

## 2018-06-12 MED ORDER — CLOPIDOGREL BISULFATE 75 MG PO TABS
150.0000 mg | ORAL_TABLET | Freq: Once | ORAL | Status: AC
  Administered 2018-06-12: 150 mg via ORAL

## 2018-06-12 MED ORDER — HEPARIN SODIUM (PORCINE) 1000 UNIT/ML IJ SOLN
INTRAMUSCULAR | Status: DC | PRN
  Administered 2018-06-12: 5000 [IU] via INTRAVENOUS

## 2018-06-12 MED ORDER — FENTANYL CITRATE (PF) 100 MCG/2ML IJ SOLN
INTRAMUSCULAR | Status: AC
  Filled 2018-06-12: qty 2

## 2018-06-12 MED ORDER — INSULIN ASPART 100 UNIT/ML ~~LOC~~ SOLN
25.0000 [IU] | Freq: Once | SUBCUTANEOUS | Status: AC
  Administered 2018-06-12: 25 [IU] via SUBCUTANEOUS
  Filled 2018-06-12: qty 1

## 2018-06-12 MED ORDER — ASPIRIN 81 MG PO CHEW
CHEWABLE_TABLET | ORAL | Status: AC
  Filled 2018-06-12: qty 1

## 2018-06-12 MED ORDER — MIDAZOLAM HCL 5 MG/5ML IJ SOLN
INTRAMUSCULAR | Status: AC
  Filled 2018-06-12: qty 5

## 2018-06-12 MED ORDER — IOHEXOL 300 MG/ML  SOLN
INTRAMUSCULAR | Status: DC | PRN
  Administered 2018-06-12: 65 mL via INTRAVENOUS

## 2018-06-12 MED ORDER — FENTANYL CITRATE (PF) 100 MCG/2ML IJ SOLN
INTRAMUSCULAR | Status: DC | PRN
  Administered 2018-06-12: 50 ug via INTRAVENOUS

## 2018-06-12 MED ORDER — ASPIRIN EC 81 MG PO TBEC
DELAYED_RELEASE_TABLET | ORAL | Status: AC
  Administered 2018-06-12: 16:00:00
  Filled 2018-06-12: qty 1

## 2018-06-12 MED ORDER — CLOPIDOGREL BISULFATE 75 MG PO TABS
ORAL_TABLET | ORAL | Status: AC
  Administered 2018-06-12: 150 mg
  Filled 2018-06-12: qty 2

## 2018-06-12 MED ORDER — MIDAZOLAM HCL 2 MG/2ML IJ SOLN
INTRAMUSCULAR | Status: DC | PRN
  Administered 2018-06-12: 2 mg via INTRAVENOUS

## 2018-06-12 SURGICAL SUPPLY — 22 items
BALLN LUTONIX 018 4X40X130 (BALLOONS) ×3
BALLN LUTONIX 018 5X150X130 (BALLOONS) ×3
BALLN LUTONIX 018 5X220X130 (BALLOONS) ×3
BALLN ULTRVRSE 3X220X150 (BALLOONS) ×3
BALLOON LUTONIX 018 4X40X130 (BALLOONS) IMPLANT
BALLOON LUTONIX 018 5X150X130 (BALLOONS) IMPLANT
BALLOON LUTONIX 018 5X220X130 (BALLOONS) IMPLANT
BALLOON ULTRVRSE 3X220X150 (BALLOONS) IMPLANT
CATH BEACON 5 .038 100 VERT TP (CATHETERS) ×2 IMPLANT
CATH CXI SUPP ST 4FR 135CM (CATHETERS) ×2 IMPLANT
CATH PIG 70CM (CATHETERS) ×2 IMPLANT
DEVICE PRESTO INFLATION (MISCELLANEOUS) ×2 IMPLANT
DEVICE STARCLOSE SE CLOSURE (Vascular Products) ×2 IMPLANT
GLIDEWIRE ADV .035X260CM (WIRE) ×3 IMPLANT
PACK ANGIOGRAPHY (CUSTOM PROCEDURE TRAY) ×3 IMPLANT
SHEATH ANL2 6FRX45 HC (SHEATH) ×3 IMPLANT
SHEATH BRITE TIP 5FRX11 (SHEATH) ×4 IMPLANT
STENT VIABAHN 6X150X120 (Permanent Stent) ×2 IMPLANT
SYR MEDRAD MARK 7 150ML (SYRINGE) ×2 IMPLANT
TUBING CONTRAST HIGH PRESS 72 (TUBING) ×3 IMPLANT
WIRE G V18X300CM (WIRE) ×2 IMPLANT
WIRE J 3MM .035X145CM (WIRE) ×2 IMPLANT

## 2018-06-12 NOTE — Consult Note (Signed)
NAME: [name].  DOB: 01-26-1932  MRN: [id number]  Date/Time: 06/12/2018 6:42 PM  REQUESTING PROVIDER: patel Subjective:  REASON FOR CONSULT: left foot infection ? [name]. is a 85 y.o. male with a history of  COPD/Afib/CHF/HTN has had a left foot wound for a few months and is followed at the wound clinic. He was referred to the ED from wound care clinic on 2/28 because the wound was getting black. Seen by podiatrist -planning for surgery- had Vascular intervention today and had balloon angioplasty and stent  I am seeing the patient for the foot infection  Pt has no fever or chills Lives with his wife Not sure how he got the wound on his left great toe  Past medical history CAD Chronic A. fib Congestive heart failure CVA Diabetes mellitus COPD Left lung adenocarcinoma Hypertension Hyperlipidemia Poor mobility of the left shoulder Recurrent left arm cellulitis/swelling  Past surgical history Cardiac catheterization angioplasty Stent placement Cataract removal Cystoscopy with stent placement Tonsillectomy and adenoidectomy Lithotripsy   Social History   Socioeconomic History  . Marital status: Married    Spouse name: Not on file  . Number of children: Not on file  . Years of education: Not on file  . Highest education level: Not on file  Occupational History  . Not on file  Social Needs  . Financial resource strain: Not very hard  . Food insecurity:    Worry: Never true    Inability: Never true  . Transportation needs:    Medical: No    Non-medical: No  Tobacco Use  . Smoking status: Former Smoker    Packs/day: 2.00    Years: 40.00    Pack years: 80.00    Types: Cigarettes    Last attempt to quit: 05/24/1990    Years since quitting: 28.0  . Smokeless tobacco: Former Systems developer    Quit date: 05/24/1990  Substance and Sexual Activity  . Alcohol use: No  . Drug use: No  . Sexual activity: Not Currently  Lifestyle  . Physical activity:   Days per week: Patient refused    Minutes per session: Patient refused  . Stress: Not at all  Relationships  . Social connections:    Talks on phone: Patient refused    Gets together: Patient refused    Attends religious service: Patient refused    Active member of club or organization: Patient refused    Attends meetings of clubs or organizations: Patient refused    Relationship status: Patient refused  . Intimate partner violence:    Fear of current or ex partner: Patient refused    Emotionally abused: Patient refused    Physically abused: Patient refused    Forced sexual activity: Patient refused  Other Topics Concern  . Not on file  Social History Narrative  . Not on file    Family History  Problem Relation Age of Onset  . Heart disease Mother   . Heart disease Maternal Grandmother   . Diabetes Maternal Grandmother   . Cancer Neg Hx   . Stroke Neg Hx    Allergies  Allergen Reactions  . Contrast Media [Iodinated Diagnostic Agents] Shortness Of Breath  . Morphine And Related Other (See Comments)    Hallucinations   . Niacin And Related Dermatitis  . Other     Other reaction(s): SHORTNESS OF BREATH  . Amlodipine Rash   ? Current Facility-Administered Medications  Medication Dose Route Frequency Provider Last Rate Last Dose  .  0.9 %  sodium chloride infusion  250 mL Intravenous PRN Algernon Huxley, MD 10 mL/hr at 06/09/18 2049 250 mL at 06/09/18 2049  . 0.9 %  sodium chloride infusion   Intravenous Continuous Dew, Erskine Squibb, MD      . 0.9 %  sodium chloride infusion   Intravenous Continuous Algernon Huxley, MD 75 mL/hr at 06/12/18 1102    . acetaminophen (TYLENOL) tablet 650 mg  650 mg Oral Q6H PRN Algernon Huxley, MD   650 mg at 06/11/18 1554   Or  . acetaminophen (TYLENOL) suppository 650 mg  650 mg Rectal Q6H PRN Algernon Huxley, MD      . albuterol (PROVENTIL) (2.5 MG/3ML) 0.083% nebulizer solution 2.5 mg  2.5 mg Nebulization Q2H PRN Algernon Huxley, MD      . ALPRAZolam Duanne Moron)  tablet 0.25 mg  0.25 mg Oral QHS PRN Algernon Huxley, MD      . Derrill Memo ON 06/13/2018] apixaban (ELIQUIS) tablet 2.5 mg  2.5 mg Oral BID Algernon Huxley, MD      . aspirin EC tablet 81 mg  81 mg Oral Daily Algernon Huxley, MD   81 mg at 06/12/18 1155  . atorvastatin (LIPITOR) tablet 40 mg  40 mg Oral Daily Algernon Huxley, MD   40 mg at 06/11/18 0911  . bisacodyl (DULCOLAX) EC tablet 5 mg  5 mg Oral Daily PRN Algernon Huxley, MD   5 mg at 06/11/18 1422  . citalopram (CELEXA) tablet 10 mg  10 mg Oral Daily Algernon Huxley, MD   10 mg at 06/12/18 1250  . colchicine tablet 0.6 mg  0.6 mg Oral BID PRN Algernon Huxley, MD   0.6 mg at 06/11/18 1422  . dorzolamide (TRUSOPT) 2 % ophthalmic solution 1 drop  1 drop Both Eyes TID Algernon Huxley, MD   1 drop at 06/12/18 1545  . ezetimibe (ZETIA) tablet 10 mg  10 mg Oral Daily Algernon Huxley, MD   10 mg at 06/12/18 1249  . finasteride (PROSCAR) tablet 5 mg  5 mg Oral Daily Algernon Huxley, MD   5 mg at 06/12/18 1250  . gabapentin (NEURONTIN) capsule 100 mg  100 mg Oral TID Algernon Huxley, MD   100 mg at 06/12/18 1545  . heparin 1000 UNIT/ML injection           . insulin aspart (novoLOG) injection 0-5 Units  0-5 Units Subcutaneous QHS Algernon Huxley, MD   2 Units at 06/11/18 2232  . insulin aspart (novoLOG) injection 0-9 Units  0-9 Units Subcutaneous TID WC Algernon Huxley, MD   7 Units at 06/12/18 1247  . insulin aspart (novoLOG) injection 4 Units  4 Units Subcutaneous TID WC Algernon Huxley, MD   4 Units at 06/12/18 1246  . insulin detemir (LEVEMIR) injection 25 Units  25 Units Subcutaneous Q2200 Algernon Huxley, MD   25 Units at 06/12/18 0908  . latanoprost (XALATAN) 0.005 % ophthalmic solution 1 drop  1 drop Both Eyes QHS Algernon Huxley, MD   1 drop at 06/11/18 2208  . levothyroxine (SYNTHROID, LEVOTHROID) tablet 50 mcg  50 mcg Oral QAC breakfast Algernon Huxley, MD   50 mcg at 06/11/18 0912  . metolazone (ZAROXOLYN) tablet 5 mg  5 mg Oral Daily PRN Algernon Huxley, MD      . metoprolol succinate (TOPROL-XL)  24 hr tablet 50 mg  50 mg Oral  BID Algernon Huxley, MD   50 mg at 06/11/18 2209  . mometasone-formoterol (DULERA) 200-5 MCG/ACT inhaler 2 puff  2 puff Inhalation BID Algernon Huxley, MD   2 puff at 06/12/18 0900  . montelukast (SINGULAIR) tablet 10 mg  10 mg Oral QHS Algernon Huxley, MD   10 mg at 06/11/18 2209  . nitroGLYCERIN (NITROSTAT) SL tablet 0.4 mg  0.4 mg Sublingual Q5 min PRN Algernon Huxley, MD      . ondansetron [facility]) tablet 4 mg  4 mg Oral Q6H PRN Algernon Huxley, MD       Or  . ondansetron (ZOFRAN) injection 4 mg  4 mg Intravenous Q6H PRN Algernon Huxley, MD      . piperacillin-tazobactam (ZOSYN) IVPB 3.375 g  3.375 g Intravenous Q8H Algernon Huxley, MD 12.5 mL/hr at 06/12/18 1442 3.375 g at 06/12/18 1442  . potassium chloride SA (K-DUR,KLOR-CON) CR tablet 80 mEq  80 mEq Oral BID Algernon Huxley, MD   80 mEq at 06/12/18 1251  . predniSONE (DELTASONE) tablet 10 mg  10 mg Oral Q breakfast Algernon Huxley, MD   10 mg at 06/12/18 0900  . senna-docusate (Senokot-S) tablet 1 tablet  1 tablet Oral QHS PRN Algernon Huxley, MD      . sodium chloride flush (NS) 0.9 % injection 3 mL  3 mL Intravenous Once Algernon Huxley, MD      . sodium chloride flush (NS) 0.9 % injection 3 mL  3 mL Intravenous Q12H Algernon Huxley, MD   3 mL at 06/10/18 2255  . sodium chloride flush (NS) 0.9 % injection 3 mL  3 mL Intravenous PRN Algernon Huxley, MD   3 mL at 06/11/18 1422  . tamsulosin (FLOMAX) capsule 0.4 mg  0.4 mg Oral Daily Algernon Huxley, MD   0.4 mg at 06/12/18 1250  . torsemide (DEMADEX) tablet 40 mg  40 mg Oral BID Algernon Huxley, MD   40 mg at 06/11/18 1243  . traMADol (ULTRAM) tablet 50 mg  50 mg Oral BID Algernon Huxley, MD   50 mg at 06/11/18 2210  . traZODone (DESYREL) tablet 100 mg  100 mg Oral QHS PRN Algernon Huxley, MD   100 mg at 06/09/18 2052  . umeclidinium bromide (INCRUSE ELLIPTA) 62.5 MCG/INH 1 puff  1 puff Inhalation Daily Algernon Huxley, MD   1 puff at 06/12/18 0900  . vancomycin (VANCOCIN) IVPB 1000 mg/200 mL premix  1,000 mg  Intravenous Q36H Algernon Huxley, MD 200 mL/hr at 06/11/18 1834 1,000 mg at 06/11/18 1834     Abtx:  Anti-infectives (From admission, onward)   Start     Dose/Rate Route Frequency Ordered Stop   06/11/18 2000  vancomycin (VANCOCIN) 1,750 mg in sodium chloride 0.9 % 500 mL IVPB  Status:  Discontinued     1,750 mg 250 mL/hr over 120 Minutes Intravenous Every 48 hours 06/09/18 1841 06/11/18 1608   06/11/18 1700  vancomycin (VANCOCIN) IVPB 1000 mg/200 mL premix     1,000 mg 200 mL/hr over 60 Minutes Intravenous Every 36 hours 06/11/18 1608     06/09/18 2200  piperacillin-tazobactam (ZOSYN) IVPB 3.375 g  Status:  Discontinued     3.375 g 12.5 mL/hr over 240 Minutes Intravenous Every 12 hours 06/09/18 1819 06/09/18 2016   06/09/18 2200  piperacillin-tazobactam (ZOSYN) IVPB 3.375 g     3.375 g 12.5 mL/hr over 240 Minutes Intravenous Every  8 hours 06/09/18 2016     06/09/18 1845  vancomycin (VANCOCIN) IVPB 1000 mg/200 mL premix  Status:  Discontinued     1,000 mg 200 mL/hr over 60 Minutes Intravenous  Once 06/09/18 1832 06/11/18 0818   06/09/18 1615  vancomycin (VANCOCIN) IVPB 1000 mg/200 mL premix     1,000 mg 200 mL/hr over 60 Minutes Intravenous  Once 06/09/18 1612 06/09/18 1838      REVIEW OF SYSTEMS:  Const: negative fever, negative chills, negative weight loss Eyes: negative diplopia or visual changes, negative eye pain ENT: negative coryza, negative sore throat Resp:  cough,, dyspnea Cards: negative for chest pain, palpitations, lower extremity edema GU: negative for frequency, dysuria and hematuria GI: Negative for abdominal pain, diarrhea, bleeding, constipation Skin: negative for rash and pruritus Heme:  easy bruising NO gum/nose bleeding MS:  myalgias, arthralgias, back pain and muscle weakness Neurolo:negative for headaches, dizziness, vertigo, memory problems  Psych: negative for feelings of anxiety, depression  Endocrine: negative for thyroid, diabetes Allergy/Immunology-  allergies as above: Objective:  VITALS:  BP 104/62 (BP Location: Left Arm)   Pulse 70   Temp (!) 97.3 F (36.3 C) (Axillary)   Resp 18   Ht 5\' 6"  (1.676 m)   Wt 85.3 kg   SpO2 99%   BMI 30.35 kg/m  PHYSICAL EXAM:  General: Alert, cooperative, some resp  distress, appears stated age.  Head: Normocephalic, without obvious abnormality, atraumatic. Eyes: Conjunctivae clear, anicteric sclerae. Pupils are equal Lower lid erythema ENT Nares normal. No drainage or sinus tenderness. Lips, mucosa, and tongue normal. No Thrush Neck: Supple, symmetrical, no adenopathy, thyroid: non tender no carotid bruit and no JVD. Back: No CVA tenderness. Lungs: b/l air entry rhonchi Heart:irrgeular Abdomen: Soft,  distended. Bowel sounds normal. No masses Extremities: left great toe- medial border has ulcer with some necrosis     B/l venous edema with erythema      Skin: as above Lymph: Cervical, supraclavicular normal. Neurologic: Grossly non-focal Pertinent Labs Lab Results CBC CBC Latest Ref Rng & Units 06/11/2018 06/10/2018 06/09/2018  WBC 4.0 - 10.5 K/uL 15.4(H) 20.5(H) 20.4(H)  Hemoglobin 13.0 - 17.0 g/dL 12.4(L) 13.4 13.6  Hematocrit 39.0 - 52.0 % 38.6(L) 41.2 42.9  Platelets 150 - 400 K/uL 218 256 277   CBC Latest Ref Rng & Units 06/11/2018 06/10/2018 06/09/2018  WBC 4.0 - 10.5 K/uL 15.4(H) 20.5(H) 20.4(H)  Hemoglobin 13.0 - 17.0 g/dL 12.4(L) 13.4 13.6  Hematocrit 39.0 - 52.0 % 38.6(L) 41.2 42.9  Platelets 150 - 400 K/uL 218 256 277      Component Value Date/Time   WBC 15.4 (H) 06/11/2018 0443   RBC 4.26 06/11/2018 0443   HGB 12.4 (L) 06/11/2018 0443   HGB 13.3 08/05/2014 1839   HCT 38.6 (L) 06/11/2018 0443   HCT 40.1 08/05/2014 1839   PLT 218 06/11/2018 0443   PLT 246 08/05/2014 1839   MCV 90.6 06/11/2018 0443   MCV 83 08/05/2014 1839   MCH 29.1 06/11/2018 0443   MCHC 32.1 06/11/2018 0443   RDW 17.2 (H) 06/11/2018 0443   RDW 16.3 (H) 08/05/2014 1839   LYMPHSABS 2.3  06/09/2018 1302   LYMPHSABS 2.3 08/05/2014 1839   MONOABS 1.6 (H) 06/09/2018 1302   MONOABS 2.0 (H) 08/05/2014 1839   EOSABS 0.0 06/09/2018 1302   EOSABS 0.1 08/05/2014 1839   BASOSABS 0.1 06/09/2018 1302   BASOSABS 0.1 08/05/2014 1839    CMP Latest Ref Rng & Units 06/12/2018 06/11/2018 06/10/2018  Glucose  70 - 99 mg/dL 365(H) - 48(L)  BUN 8 - 23 mg/dL 34(H) - 41(H)  Creatinine 0.61 - 1.24 mg/dL 1.42(H) 1.52(H) 1.29(H)  Sodium 135 - 145 mmol/L 136 - 140  Potassium 3.5 - 5.1 mmol/L 3.9 2.9(L) 3.0(L)  Chloride 98 - 111 mmol/L 99 - 103  CO2 22 - 32 mmol/L 25 - 27  Calcium 8.9 - 10.3 mg/dL 8.9 - 9.2  Total Protein 6.5 - 8.1 g/dL - - -  Total Bilirubin 0.3 - 1.2 mg/dL - - -  Alkaline Phos 38 - 126 U/L - - -  AST 15 - 41 U/L - - -  ALT 0 - 44 U/L - - -      Microbiology: Recent Results (from the past 240 hour(s))  Blood culture (routine x 2)     Status: None (Preliminary result)   Collection Time: 06/09/18  5:28 PM  Result Value Ref Range Status   Specimen Description BLOOD LFA  Final   Special Requests   Final    BOTTLES DRAWN AEROBIC AND ANAEROBIC Blood Culture results may not be optimal due to an excessive volume of blood received in culture bottles   Culture   Final    NO GROWTH 3 DAYS Performed at [facility], [address] [place], [place] [address]    Report Status PENDING  Incomplete  Blood culture (routine x 2)     Status: None (Preliminary result)   Collection Time: 06/09/18  5:28 PM  Result Value Ref Range Status   Specimen Description BLOOD LAC  Final   Special Requests   Final    BOTTLES DRAWN AEROBIC AND ANAEROBIC Blood Culture results may not be optimal due to an excessive volume of blood received in culture bottles   Culture   Final    NO GROWTH 3 DAYS Performed at [facility], [address] [place], [place] [address]    Report Status PENDING  Incomplete  Aerobic/Anaerobic Culture (surgical/deep wound)     Status: None (Preliminary  result)   Collection Time: 06/09/18 10:35 PM  Result Value Ref Range Status   Specimen Description   Final    WOUND LEFT TOE GREATER Performed at [facility], [address], [place], [place] [address]    Special Requests   Final    Normal Performed at [facility], [address] [place], [place] [address]    Gram Stain   Final    FEW WBC PRESENT, PREDOMINANTLY PMN NO ORGANISMS SEEN Performed at [facility], [address], [place], [place] [address]    Culture FEW ESCHERICHIA COLI FEW PSEUDOMONAS AERUGINOSA   Final   Report Status PENDING  Incomplete   Organism ID, Bacteria ESCHERICHIA COLI  Final   Organism ID, Bacteria PSEUDOMONAS AERUGINOSA  Final      Susceptibility   Escherichia coli - MIC*    AMPICILLIN >=32 RESISTANT Resistant     CEFAZOLIN <=4 SENSITIVE Sensitive     CEFEPIME <=1 SENSITIVE Sensitive     CEFTAZIDIME <=1 SENSITIVE Sensitive     CEFTRIAXONE <=1 SENSITIVE Sensitive     CIPROFLOXACIN <=0.25 SENSITIVE Sensitive     GENTAMICIN <=1 SENSITIVE Sensitive     IMIPENEM <=0.25 SENSITIVE Sensitive     TRIMETH/SULFA <=20 SENSITIVE Sensitive     AMPICILLIN/SULBACTAM >=32 RESISTANT Resistant     PIP/TAZO <=4 SENSITIVE Sensitive     Extended ESBL NEGATIVE Sensitive     * FEW ESCHERICHIA COLI   Pseudomonas  aeruginosa - MIC*    CEFTAZIDIME 2 SENSITIVE Sensitive     CIPROFLOXACIN <=0.25 SENSITIVE Sensitive     GENTAMICIN <=1 SENSITIVE Sensitive     IMIPENEM 1 SENSITIVE Sensitive     PIP/TAZO <=4 SENSITIVE Sensitive     CEFEPIME <=1 SENSITIVE Sensitive     * FEW PSEUDOMONAS AERUGINOSA    IMAGING RESULTS: I have personally reviewed the films ? Impression/Recommendation ? 85 y.o. male with a history of  COPD/Afib/CHF/HTN has had a left foot wound for a few months and is followed at the wound clinic. He was referred to the ED from wound care clinic on 2/28 because the wound was getting black. ? Left great toe infection secondary to  PAD with necrosis Underwent angioplasty of anterior tibial, left popliteal, and stent placement to SFA Superficial wound culture has e.coli and pseudomonas after debridement .MRI of the foot shows possible osteo of first toe and joint effusion On zosyn He will go for amputation tomorrow  B/l tibial erythema secondary to lymphedema rt > left  DM on insulin- management as per primary team  Afib on eliquis /lopressor   ? ___________________________________________________ Discussed with patient and his daughter

## 2018-06-12 NOTE — Progress Notes (Signed)
Verbal order for 25 units of novolog, aware of BS 447

## 2018-06-12 NOTE — Progress Notes (Signed)
[place] [facility] [employer] NAME: [name]    MR#:  [id number]  DATE OF BIRTH:  [birth year]  SUBJECTIVE:   Patient denies any complaints. Son in the room right tibial shin distal redness. No pain. Improving from skin marking. REVIEW OF SYSTEMS:   Review of Systems  Constitutional: Negative for chills, fever and weight loss.  HENT: Negative for ear discharge, ear pain and nosebleeds.   Eyes: Negative for blurred vision, pain and discharge.  Respiratory: Negative for sputum production, shortness of breath, wheezing and stridor.   Cardiovascular: Negative for chest pain, palpitations, orthopnea and PND.  Gastrointestinal: Negative for abdominal pain, diarrhea, nausea and vomiting.  Genitourinary: Negative for frequency and urgency.  Musculoskeletal: Negative for back pain and joint pain.  Neurological: Negative for sensory change, speech change, focal weakness and weakness.  Psychiatric/Behavioral: Negative for depression and hallucinations. The patient is not nervous/anxious.    Tolerating Diet:yes Tolerating PT: pending  DRUG ALLERGIES:   Allergies  Allergen Reactions  . Contrast Media [Iodinated Diagnostic Agents] Shortness Of Breath  . Morphine And Related Other (See Comments)    Hallucinations   . Niacin And Related Dermatitis  . Other     Other reaction(s): SHORTNESS OF BREATH  . Amlodipine Rash    VITALS:  Blood pressure 115/65, pulse 65, temperature 97.7 F (36.5 C), temperature source Oral, resp. rate 18, height 5\' 6"  (1.676 m), weight 85.3 kg, SpO2 95 %.  PHYSICAL EXAMINATION:   Physical Exam  GENERAL:  85 y.o.-year-old patient lying in the bed with no acute distress.  EYES: Pupils equal, round, reactive to light and accommodation. No scleral icterus. Extraocular muscles intact.  HEENT: Head atraumatic, normocephalic. Oropharynx and nasopharynx clear.  NECK:  Supple, no jugular venous distention. No thyroid  enlargement, no tenderness.  LUNGS: Normal breath sounds bilaterally, no wheezing, rales, rhonchi. No use of accessory muscles of respiration.  CARDIOVASCULAR: S1, S2 normal. No murmurs, rubs, or gallops.  ABDOMEN: Soft, nontender, nondistended. Bowel sounds present. No organomegaly or mass.  EXTREMITIES: No cyanosis, clubbing or edema b/l.   Left foot dressing + right distal tibial shin cellulitis improved from skin marking NEUROLOGIC: Cranial nerves II through XII are intact. No focal Motor or sensory deficits b/l.   PSYCHIATRIC:  patient is alert and oriented x 3.  SKIN: No obvious rash, lesion, or ulcer.   LABORATORY PANEL:  CBC Recent Labs  Lab 06/11/18 0443  WBC 15.4*  HGB 12.4*  HCT 38.6*  PLT 218    Chemistries  Recent Labs  Lab 06/09/18 1302  06/11/18 0443 06/12/18 0307  NA 140   < >  --  136  K 4.1   < > 2.9* 3.9  CL 100   < >  --  99  CO2 29   < >  --  25  GLUCOSE 115*   < >  --  365*  BUN 50*   < >  --  34*  CREATININE 1.62*   < > 1.52* 1.42*  CALCIUM 9.4   < >  --  8.9  MG  --   --  2.5*  --   AST 14*  --   --   --   ALT 14  --   --   --   ALKPHOS 67  --   --   --   BILITOT 0.7  --   --   --    < > = values  in this interval not displayed.   Cardiac Enzymes No results for input(s): TROPONINI in the last 168 hours. RADIOLOGY:  No results found. ASSESSMENT AND PLAN:  [name]  is a 85 y.o. male with a known history of CAD, A. fib, hypertension, diabetes, chronic diastolic CHF, CVA, COPD with oxygen at night, hyperlipidemia, neuropathy and foot ulcer, etc. The patient presents the ED with worsening left foot infection.  The patient has been following up with wound care for left foot ulcer, which has been worsening for the past few days.  *Left foot diabetic ulcer infection with leukocytosis, ruled out osteomyelitis with negative MRI -Continue IV vancomycin and Zosyn. De-escalate antibiotics  -wound culture shows few gram-negative rods --MRI of left  foot Soft tissue wound along the medial aspect of the first MTP joint. Small joint effusion of the first MTP joint with mild marrow edema at the base of the first proximal phalanx and head of the first metatarsal adjacent to the joint and soft tissue wound. No definite cortical destruction. -Status post excision and debridement at bedside by podiatry [place] -plans for first Ray resection by podiatry plan for Tuesday, March 3  *Right tibial shin cellulitis improving from skin marking area. Patient already on broad-spectrum antibiotics. He has significant dry skin in the foot likely the source for cellulitis  *Peripheral vascular disease  -vascular consultappreciated. Patient will get left lower extremity angiogram on Monday after premedication on Sunday due to some allergy to contrast dye 25 years ago  *Diabetes.  Continue Levemir, insulin AC and start sliding scale. Sugars low in am--insulin changes made -sugars elevated secondary to prednisone premedication given for contrast allergy  *Diabetes neuropathy.  Continue current regimen.  *CKD.  Stage IV.  Stable.  *History of A. fib.  Continue Lopressor.  Hold Eliquis for possible procedure per Dr. Cleda Mccreedy.  Management plans discussed with the patient  CODE STATUS: Full code  DVT Prophylaxis: will on eliquis after discussion with podiatry  TOTAL TIME TAKING CARE OF THIS PATIENT: 30 minutes.  >50% time spent on counselling and coordination of care  POSSIBLE D/C IN *2 to 3* DAYS, DEPENDING ON CLINICAL CONDITION.  Note: This dictation was prepared with Dragon dictation along with smaller phrase technology. Any transcriptional errors that result from this process are unintentional.  Fritzi Mandes M.D on 06/12/2018 at 8:25 AM  Between 7am to 6pm - Pager - [phone]  After 6pm go to [email or web address] - password [email or web address] [facility]  Office  [phone]  CC: Primary care physician; Jearld Fenton, NPPatient ID:  Shawn Grace., male   DOB: 1931/11/07, 85 y.o.   MRN: [id number]

## 2018-06-12 NOTE — H&P (Signed)
[facility] History & Physical Update  The patient was interviewed and re-examined.  The patient's previous History and Physical has been reviewed and is unchanged.  There is no change in the plan of care. We plan to proceed with the scheduled procedure.  Leotis Pain, MD  06/12/2018, 9:04 AM

## 2018-06-12 NOTE — Progress Notes (Signed)
Day of Surgery   Subjective/Chief Complaint: Patient seen.  Somewhat tired.  Just returned from his vascular procedure.   Objective: Vital signs in last 24 hours: Temp:  [97.5 F (36.4 C)-101.6 F (38.7 C)] 97.5 F (36.4 C) (03/02 0917) Pulse Rate:  [64-95] 80 (03/02 1145) Resp:  [18-24] 18 (03/02 1045) BP: (94-135)/(49-79) 104/64 (03/02 1145) SpO2:  [92 %-100 %] 95 % (03/02 1145) Weight:  [85.3 kg] 85.3 kg (03/02 0917) Last BM Date: 06/09/18  Intake/Output from previous day: 03/01 0701 - 03/02 0700 In: 1165.2 [P.O.:240; I.V.:676.3; IV Piggyback:248.9] Out: 1050 [Urine:1050] Intake/Output this shift: No intake/output data recorded.  Bandage is dry and intact.  Lab Results:  Recent Labs    06/10/18 0647 06/11/18 0443  WBC 20.5* 15.4*  HGB 13.4 12.4*  HCT 41.2 38.6*  PLT 256 218   BMET Recent Labs    06/10/18 0647 06/11/18 0443 06/12/18 0307  NA 140  --  136  K 3.0* 2.9* 3.9  CL 103  --  99  CO2 27  --  25  GLUCOSE 48*  --  365*  BUN 41*  --  34*  CREATININE 1.29* 1.52* 1.42*  CALCIUM 9.2  --  8.9   PT/INR Recent Labs    06/09/18 1305  LABPROT 15.0  INR 1.2   ABG No results for input(s): PHART, HCO3 in the last 72 hours.  Invalid input(s): PCO2, PO2  Studies/Results: No results found.  Anti-infectives: Anti-infectives (From admission, onward)   Start     Dose/Rate Route Frequency Ordered Stop   06/11/18 2000  vancomycin (VANCOCIN) 1,750 mg in sodium chloride 0.9 % 500 mL IVPB  Status:  Discontinued     1,750 mg 250 mL/hr over 120 Minutes Intravenous Every 48 hours 06/09/18 1841 06/11/18 1608   06/11/18 1700  vancomycin (VANCOCIN) IVPB 1000 mg/200 mL premix     1,000 mg 200 mL/hr over 60 Minutes Intravenous Every 36 hours 06/11/18 1608     06/09/18 2200  piperacillin-tazobactam (ZOSYN) IVPB 3.375 g  Status:  Discontinued     3.375 g 12.5 mL/hr over 240 Minutes Intravenous Every 12 hours 06/09/18 1819 06/09/18 2016   06/09/18 2200   piperacillin-tazobactam (ZOSYN) IVPB 3.375 g     3.375 g 12.5 mL/hr over 240 Minutes Intravenous Every 8 hours 06/09/18 2016     06/09/18 1845  vancomycin (VANCOCIN) IVPB 1000 mg/200 mL premix  Status:  Discontinued     1,000 mg 200 mL/hr over 60 Minutes Intravenous  Once 06/09/18 1832 06/11/18 0818   06/09/18 1615  vancomycin (VANCOCIN) IVPB 1000 mg/200 mL premix     1,000 mg 200 mL/hr over 60 Minutes Intravenous  Once 06/09/18 1612 06/09/18 1838      Assessment/Plan: s/p Procedure(s): Lower Extremity Angiography (Left) Full-thickness ulceration with necrosis and probable septic joint and/or osteomyelitis.   Plan: Discussed with the patient and his son that at this point his circulation should be the best it is going to get.  Discussed surgery for ray resection of his left great toe.  Discussed that no guarantees can be given as to the outcome or healing potential.  Agrees to proceed with surgery tomorrow afternoon.  Consent form for first ray amputation.  N.p.o. after midnight.  Plan for surgery tomorrow afternoon  LOS: 3 days    [name] 06/12/2018

## 2018-06-12 NOTE — Op Note (Signed)
[facility]  Percutaneous Study/Intervention Procedural Note   Date of Surgery: 06/12/2018  Surgeon(s):DEW,JASON    Assistants:none  Pre-operative Diagnosis: PAD with ulceration LLE  Post-operative diagnosis:  Same  Procedure(s) Performed:             1.  Ultrasound guidance for vascular access right femoral artery             2.  Catheter placement into left SFA from right femoral approach             3.  Aortogram and selective left lower extremity angiogram             4.  Percutaneous transluminal angioplasty of the left anterior tibial artery in the proximal and mid segments with a 3 mm diameter by 22 cm length angioplasty balloon and at the origin with a 4 mm diameter by 4 cm length Lutonix drug-coated angioplasty balloon             5.   Percutaneous transluminal angioplasty of the left popliteal artery in the left SFA with a 5 mm diameter by 22 cm length Lutonix drug-coated angioplasty balloon and a 5 mm diameter by 15 cm length Lutonix drug-coated angioplasty balloon  6.  Viabahn stent placement to the mid to distal SFA with a 6 mm diameter by 15 cm length stent for greater than 50% residual stenosis in this location             7.  StarClose closure device right femoral artery  EBL: 10 cc  Contrast: 65 cc  Fluoro Time: 10.9 minutes  Moderate Conscious Sedation Time: approximately 45 minutes using 3 mg of Versed and 75 mcg of Fentanyl              Indications:  Patient is a 85 y.o.male with ulceration and infection of the left foot.  He does have a dry ulceration on the tip of the right foot as well.  Due to the poor noninvasive studies at our institution, he has not had adequate noninvasive studies.  The patient is brought in for angiography for further evaluation and potential treatment.  Due to the limb threatening nature of the situation, angiogram was performed for attempted limb salvage. The patient is aware that if the procedure fails, amputation  would be expected.  The patient also understands that even with successful revascularization, amputation may still be required due to the severity of the situation.  Risks and benefits are discussed and informed consent is obtained.   Procedure:  The patient was identified and appropriate procedural time out was performed.  The patient was then placed supine on the table and prepped and draped in the usual sterile fashion. Moderate conscious sedation was administered during a face to face encounter with the patient throughout the procedure with my supervision of the RN administering medicines and monitoring the patient's vital signs, pulse oximetry, telemetry and mental status throughout from the start of the procedure until the patient was taken to the recovery room. Ultrasound was used to evaluate the right common femoral artery.  It was patent .  A digital ultrasound image was acquired.  A Seldinger needle was used to access the right common femoral artery under direct ultrasound guidance and a permanent image was performed.  A 0.035 J wire was advanced without resistance and a 5Fr sheath was placed.  Pigtail catheter was placed into the aorta and an AP aortogram was performed. This demonstrated normal renal arteries  and normal aorta and iliac segments without significant stenosis. I then crossed the aortic bifurcation and advanced to the left femoral head.  The pigtail catheter was advanced down into the proximal left SFA to help opacify distally to see the distal perfusion with his slow circulation time.  Selective left lower extremity angiogram was then performed. This demonstrated normal common femoral artery and profundofemoral artery.  In the proximal to mid SFA there was an occlusion over about an 8 to 10 cm segment.  This reconstituted in the distal SFA which was heavily diseased and had a high-grade stenosis of greater than 80% at Hunter's canal.  There was another stenosis just above the knee of about  80% in the tibial vessels, initially the peroneal artery was the only runoff distally.  The anterior tibial artery had about a 90% stenosis at its origin and several areas of moderate to high-grade stenosis in the proximal to mid segments but then was continuous and patent in the mid to distal anterior tibial artery into the foot.  The posterior tibial artery was chronically occluded. It was felt that it was in the patient's best interest to proceed with intervention after these images to avoid a second procedure and a larger amount of contrast and fluoroscopy based off of the findings from the initial angiogram. The patient was systemically heparinized and a 6 [place] Ansell sheath was then placed over the [employer] wire. I then used a Kumpe catheter and the advantage wire to navigate through the SFA and popliteal occlusions and stenosis.  Due to the calcific lesion, the Kumpe catheter would not advance well and so we exchanged for a CXI catheter.  We confirmed intraluminal flow in the below-knee popliteal artery.  I then advanced across the stenoses in the anterior tibial artery with the help of the CXI catheter and the advantage wire and confirmed intraluminal flow in the mid anterior tibial artery with selective imaging.  I then placed a 0.018 wire and parked this in the foot.  The diagnostic catheter was removed and we proceeded with treatment.  A 3 mm diameter by 22 cm length angioplasty balloon was inflated in the proximal and mid anterior tibial artery and taken up to 12 atm for 1 minute.  This balloon was used to predilate the highly calcific SFA and popliteal lesions.  A 5 mm diameter by 22 cm length Lutonix drug-coated angioplasty balloon was inflated to 12 atm for 1 minute from the proximal to mid SFA down to Hunter's canal.  A 5 mm diameter by 15 cm length Lutonix drug-coated angioplasty balloon was inflated to 8 atm for 1 minute from Hunter's canal down to the below-knee popliteal artery.   Following this, the mid to distal SFA still had greater than 50% residual stenosis and a 6 mm diameter by 15 cm length via bond stent was deployed from the mid SFA down to Hunter's canal and postdilated with a 5 mm balloon.  The remainder of the SFA and popliteal arteries had less than 30% residual stenosis.  The mid anterior tibial artery was markedly improved as well as most of the proximal anterior tibial artery but the origin still had a greater than 50% residual stenosis.  I used a 4 mm diameter by 4 cm length Lutonix drug-coated angioplasty balloon inflated to 8 atm for 1 minute and the origin of the anterior tibial artery.  Completion imaging following this showed only about a 20 to 25% residual stenosis in this location.  He  now had two-vessel runoff distally and we had markedly improved his perfusion. I elected to terminate the procedure. The sheath was removed and StarClose closure device was deployed in the right femoral artery with excellent hemostatic result. The patient was taken to the recovery room in stable condition having tolerated the procedure well.  Findings:               Aortogram:  Renal arteries appeared to be patent although not particularly well opacified in part due to motion in part due to overlying of the SMA over the left renal artery.  The aorta and iliac arteries were calcific but not stenotic.             Left lower Extremity:  Normal common femoral artery and profundofemoral artery.  In the proximal to mid SFA there was an occlusion over about an 8 to 10 cm segment.  This reconstituted in the distal SFA which was heavily diseased and had a high-grade stenosis of greater than 80% at Hunter's canal.  There was another stenosis just above the knee of about 80% in the tibial vessels, initially the peroneal artery was the only runoff distally.  The anterior tibial artery had about a 90% stenosis at its origin and several areas of moderate to high-grade stenosis in the proximal to  mid segments but then was continuous and patent in the mid to distal anterior tibial artery into the foot.  The posterior tibial artery was chronically occluded.   Disposition: Patient was taken to the recovery room in stable condition having tolerated the procedure well.  Complications: None  Leotis Pain 06/12/2018 10:35 AM   This note was created with Dragon Medical transcription system. Any errors in dictation are purely unintentional.

## 2018-06-12 NOTE — H&P (View-Only) (Signed)
Day of Surgery   Subjective/Chief Complaint: Patient seen.  Somewhat tired.  Just returned from his vascular procedure.   Objective: Vital signs in last 24 hours: Temp:  [97.5 F (36.4 C)-101.6 F (38.7 C)] 97.5 F (36.4 C) (03/02 0917) Pulse Rate:  [64-95] 80 (03/02 1145) Resp:  [18-24] 18 (03/02 1045) BP: (94-135)/(49-79) 104/64 (03/02 1145) SpO2:  [92 %-100 %] 95 % (03/02 1145) Weight:  [85.3 kg] 85.3 kg (03/02 0917) Last BM Date: 06/09/18  Intake/Output from previous day: 03/01 0701 - 03/02 0700 In: 1165.2 [P.O.:240; I.V.:676.3; IV Piggyback:248.9] Out: 1050 [Urine:1050] Intake/Output this shift: No intake/output data recorded.  Bandage is dry and intact.  Lab Results:  Recent Labs    06/10/18 0647 06/11/18 0443  WBC 20.5* 15.4*  HGB 13.4 12.4*  HCT 41.2 38.6*  PLT 256 218   BMET Recent Labs    06/10/18 0647 06/11/18 0443 06/12/18 0307  NA 140  --  136  K 3.0* 2.9* 3.9  CL 103  --  99  CO2 27  --  25  GLUCOSE 48*  --  365*  BUN 41*  --  34*  CREATININE 1.29* 1.52* 1.42*  CALCIUM 9.2  --  8.9   PT/INR Recent Labs    06/09/18 1305  LABPROT 15.0  INR 1.2   ABG No results for input(s): PHART, HCO3 in the last 72 hours.  Invalid input(s): PCO2, PO2  Studies/Results: No results found.  Anti-infectives: Anti-infectives (From admission, onward)   Start     Dose/Rate Route Frequency Ordered Stop   06/11/18 2000  vancomycin (VANCOCIN) 1,750 mg in sodium chloride 0.9 % 500 mL IVPB  Status:  Discontinued     1,750 mg 250 mL/hr over 120 Minutes Intravenous Every 48 hours 06/09/18 1841 06/11/18 1608   06/11/18 1700  vancomycin (VANCOCIN) IVPB 1000 mg/200 mL premix     1,000 mg 200 mL/hr over 60 Minutes Intravenous Every 36 hours 06/11/18 1608     06/09/18 2200  piperacillin-tazobactam (ZOSYN) IVPB 3.375 g  Status:  Discontinued     3.375 g 12.5 mL/hr over 240 Minutes Intravenous Every 12 hours 06/09/18 1819 06/09/18 2016   06/09/18 2200   piperacillin-tazobactam (ZOSYN) IVPB 3.375 g     3.375 g 12.5 mL/hr over 240 Minutes Intravenous Every 8 hours 06/09/18 2016     06/09/18 1845  vancomycin (VANCOCIN) IVPB 1000 mg/200 mL premix  Status:  Discontinued     1,000 mg 200 mL/hr over 60 Minutes Intravenous  Once 06/09/18 1832 06/11/18 0818   06/09/18 1615  vancomycin (VANCOCIN) IVPB 1000 mg/200 mL premix     1,000 mg 200 mL/hr over 60 Minutes Intravenous  Once 06/09/18 1612 06/09/18 1838      Assessment/Plan: s/p Procedure(s): Lower Extremity Angiography (Left) Full-thickness ulceration with necrosis and probable septic joint and/or osteomyelitis.   Plan: Discussed with the patient and his son that at this point his circulation should be the best it is going to get.  Discussed surgery for ray resection of his left great toe.  Discussed that no guarantees can be given as to the outcome or healing potential.  Agrees to proceed with surgery tomorrow afternoon.  Consent form for first ray amputation.  N.p.o. after midnight.  Plan for surgery tomorrow afternoon  LOS: 3 days    [name] 06/12/2018

## 2018-06-12 NOTE — Anesthesia Preprocedure Evaluation (Addendum)
Anesthesia Evaluation  Patient identified by MRN, date of birth, ID band Patient awake    Reviewed: Allergy & Precautions, H&P , NPO status , Patient's Chart, lab work & pertinent test results  History of Anesthesia Complications Negative for: history of anesthetic complications  Airway Mallampati: III  TM Distance: >3 FB Neck ROM: full    Dental  (+) Poor Dentition, Missing, Edentulous Lower, Edentulous Upper   Pulmonary neg shortness of breath, COPD,  oxygen dependent, former smoker,    Pulmonary exam normal        Cardiovascular hypertension, (-) angina+ CAD, + Past MI, + Cardiac Stents and +CHF  Normal cardiovascular exam+ dysrhythmias Atrial Fibrillation  Rhythm:Irregular Rate:Abnormal     Neuro/Psych Anxiety CVA negative psych ROS   GI/Hepatic Neg liver ROS, GERD  Medicated and Controlled,  Endo/Other  diabetes, Type 2Hypothyroidism   Renal/GU Renal diseasestone     Musculoskeletal  (+) Arthritis , Osteoarthritis,    Abdominal   Peds  Hematology negative hematology ROS (+)   Anesthesia Other Findings Past Medical History: No date: Arthritis No date: CAD     Comment:  a. MI 01/29/1996 tx'd w/ TPA @ [facility]; b. Myoview 06/2005:               EF 50%, scar @ apex, mild peri-infarct ischemia No date: Cancer ([place])     Comment:  skin No date: Chronic atrial fibrillation     Comment:  a. since 2006; b. on warfarin No date: Chronic diastolic CHF (congestive heart failure) ([place])     Comment:  a. echo 04/2006: EF lower limits of nl, mod LVH, mild               aortic root dilatation, & mild MR, biatrial enlargement;               b. echo 04/2013: EF 60%, mod dilated LA, mild MR & TR, mod              pulm HTN w/ RV systolic pressure 53, c. echo 04/21/14: EF               55-60%, unable to exclude WMA, severely dilated LA 6.6               cm, nl RVSP, mildly dilated aortic root No date: COPD (chronic obstructive  pulmonary disease) ([facility])     Comment:  oxygen prn at home [phone]: CVA     Comment:  x2 No date: DM No date: Falls No date: GERD (gastroesophageal reflux disease) No date: History of kidney stones No date: HYPERLIPIDEMIA No date: HYPERTENSION No date: Kidney stone     Comment:  a. s/p left ureteral stenting 04/24/14 No date: Left arm weakness     Comment:  limited movement. S/P fall injury No date: Neuropathy of both feet No date: Poor balance No date: Wears dentures     Comment:  full upper and lower  Reproductive/Obstetrics negative OB ROS                            Anesthesia Physical  Anesthesia Plan  ASA: IV  Anesthesia Plan: General   Post-op Pain Management:    Induction: Intravenous  PONV Risk Score and Plan: Dexamethasone, Ondansetron, Midazolam and Treatment may vary due to age or medical condition  Airway Management Planned: Natural Airway and Nasal Cannula  Additional Equipment:   Intra-op Plan:   Post-operative Plan:  Informed Consent: I have reviewed the patients History and Physical, chart, labs and discussed the procedure including the risks, benefits and alternatives for the proposed anesthesia with the patient or authorized representative who has indicated his/her understanding and acceptance.     Dental Advisory Given  Plan Discussed with: Anesthesiologist, CRNA and Surgeon  Anesthesia Plan Comments: (Patient consented for risks of anesthesia including but not limited to:  - adverse reactions to medications - risk of intubation if required - damage to teeth, lips or other oral mucosa - sore throat or hoarseness - Damage to heart, brain, lungs or loss of life  Patient voiced understanding.)       Anesthesia Quick Evaluation

## 2018-06-12 NOTE — Progress Notes (Signed)
Inpatient Diabetes Program Recommendations  AACE/ADA: New Consensus Statement on Inpatient Glycemic Control (2015)  Target Ranges:  Prepandial:   less than 140 mg/dL      Peak postprandial:   less than 180 mg/dL (1-2 hours)      Critically ill patients:  140 - 180 mg/dL   Lab Results  Component Value Date   GLUCAP 319 (H) 06/12/2018   HGBA1C 6.5 (H) 04/17/2018    Review of chart for Glycemic Control Results for DALTEN, AMBROSINO" (MRN [id number]) as of 06/12/2018 11:35  Ref. Range 06/11/2018 12:09 06/11/2018 16:49 06/11/2018 21:03 06/12/2018 08:11 06/12/2018 10:42  Glucose-Capillary Latest Ref Range: 70 - 99 mg/dL 267 (H) 283 (H) 248 (H) 322 (H) 319 (H)   Diabetes history: DM 2 Outpatient Diabetes medications:  Levemir 42 units daily, Humalog with meals- 18 units with breakfast/20 units with lunch/16 units with supper Current orders for Inpatient glycemic control:  Levemir 25 units q HS, Novolog 4 units tid with meals, Novolog sensitive tid with meals and HS  Inpatient Diabetes Program Recommendations:    Note that patient received Prednisone 50 mg x3 prior to procedure.  This will likely increase blood sugars.  May consider increasing Levemir to 32 units daily and increase Novolog meal coverage to 8 units tid with meals.   Thanks  Adah Perl, RN, BC-ADM Inpatient Diabetes Coordinator Pager [phone] (8a-5p)

## 2018-06-12 NOTE — Care Management Important Message (Signed)
Important Message  Patient Details  Name: [name]. MRN: [id number] Date of Birth: [birth year]   Medicare Important Message Given:  Yes    Juliann Pulse A Geneve Kimpel 06/12/2018, 11:46 AM

## 2018-06-12 NOTE — Progress Notes (Addendum)
Pharmacy Electrolyte Monitoring Consult:  Pharmacy consulted to assist in monitoring and replacing electrolytes in this 85 y.o. male admitted on 06/09/2018 with No chief complaint on file.   Labs:  Sodium (mmol/L)  Date Value  06/12/2018 136  02/21/2015 143  08/05/2014 137   Potassium (mmol/L)  Date Value  06/12/2018 3.9  08/05/2014 3.6   Magnesium (mg/dL)  Date Value  06/11/2018 2.5 (H)  04/20/2014 2.1   Phosphorus (mg/dL)  Date Value  05/13/2016 3.6  04/20/2014 3.0   Calcium (mg/dL)  Date Value  06/12/2018 8.9   Calcium, Total (mg/dL)  Date Value  08/05/2014 8.6 (L)   Albumin (g/dL)  Date Value  06/09/2018 3.3 (L)  05/02/2014 2.4 (L)    Assessment/Plan: Per consult, MD requesting oral replacement, Hx Afib  3/2 Electrolytes WNL.  KCL 80 mEq BID ordered by physician.  No replacement needed at this time.  Will recheck labs in AM and continue to replace as needed.   Pernell Dupre, PharmD, BCPS Clinical Pharmacist 06/12/2018 7:26 AM

## 2018-06-12 NOTE — Progress Notes (Signed)
Dr. Lucky Cowboy at bedside, speaking with pt. Son re: procedural results. MD wants 2 plavix po. Now and a baby Aspirin. To hold Eliquis for today.

## 2018-06-13 ENCOUNTER — Inpatient Hospital Stay: Payer: Medicare Other | Admitting: Anesthesiology

## 2018-06-13 ENCOUNTER — Encounter: Payer: Self-pay | Admitting: Vascular Surgery

## 2018-06-13 ENCOUNTER — Encounter
Admission: EM | Disposition: A | Discharge status: Skilled Nursing Facility | Payer: Self-pay | Source: Home / Self Care | Attending: Internal Medicine

## 2018-06-13 HISTORY — PX: AMPUTATION TOE: SHX6595

## 2018-06-13 LAB — BASIC METABOLIC PANEL
Anion gap: 7 (ref 5–15)
BUN: 41 mg/dL — AB (ref 8–23)
CO2: 26 mmol/L (ref 22–32)
Calcium: 8.8 mg/dL — ABNORMAL LOW (ref 8.9–10.3)
Chloride: 105 mmol/L (ref 98–111)
Creatinine, Ser: 1.62 mg/dL — ABNORMAL HIGH (ref 0.61–1.24)
GFR calc Af Amer: 45 mL/min — ABNORMAL LOW (ref >60)
GFR calc non Af Amer: 38 mL/min — ABNORMAL LOW (ref >60)
GLUCOSE: 277 mg/dL — AB (ref 70–99)
Potassium: 4.5 mmol/L (ref 3.5–5.1)
Sodium: 138 mmol/L (ref 135–145)

## 2018-06-13 LAB — GLUCOSE, CAPILLARY
GLUCOSE-CAPILLARY: 172 mg/dL — AB (ref 70–99)
Glucose-Capillary: 218 mg/dL — ABNORMAL HIGH (ref 70–99)
Glucose-Capillary: 199 mg/dL — ABNORMAL HIGH (ref 70–99)
Glucose-Capillary: 196 mg/dL — ABNORMAL HIGH (ref 70–99)
Glucose-Capillary: 172 mg/dL — ABNORMAL HIGH (ref 70–99)
Glucose-Capillary: 216 mg/dL — ABNORMAL HIGH (ref 70–99)

## 2018-06-13 LAB — SURGICAL PCR SCREEN
MRSA, PCR: NEGATIVE
Staphylococcus aureus: NEGATIVE

## 2018-06-13 SURGERY — AMPUTATION, TOE
Anesthesia: General | Site: Toe | Laterality: Left

## 2018-06-13 MED ORDER — PROPOFOL 10 MG/ML IV BOLUS
INTRAVENOUS | Status: DC | PRN
  Administered 2018-06-13: 100 mg via INTRAVENOUS
  Administered 2018-06-13: 30 mg via INTRAVENOUS

## 2018-06-13 MED ORDER — LACTATED RINGERS IV SOLN
INTRAVENOUS | Status: DC | PRN
  Administered 2018-06-13: 15:00:00 via INTRAVENOUS

## 2018-06-13 MED ORDER — BUPIVACAINE HCL 0.5 % IJ SOLN
INTRAMUSCULAR | Status: DC | PRN
  Administered 2018-06-13: 10 mL

## 2018-06-13 MED ORDER — INSULIN DETEMIR 100 UNIT/ML ~~LOC~~ SOLN
32.0000 [IU] | Freq: Every day | SUBCUTANEOUS | Status: DC
  Administered 2018-06-13 – 2018-06-15 (×3): 32 [IU] via SUBCUTANEOUS
  Filled 2018-06-13 (×4): qty 0.32

## 2018-06-13 MED ORDER — FENTANYL CITRATE (PF) 100 MCG/2ML IJ SOLN
INTRAMUSCULAR | Status: DC | PRN
  Administered 2018-06-13 (×2): 25 ug via INTRAVENOUS

## 2018-06-13 MED ORDER — PROPOFOL 10 MG/ML IV BOLUS
INTRAVENOUS | Status: AC
  Filled 2018-06-13: qty 20

## 2018-06-13 MED ORDER — ONDANSETRON HCL 4 MG/2ML IJ SOLN
INTRAMUSCULAR | Status: DC | PRN
  Administered 2018-06-13: 4 mg via INTRAVENOUS

## 2018-06-13 MED ORDER — VANCOMYCIN HCL 1000 MG IV SOLR
INTRAVENOUS | Status: DC | PRN
  Administered 2018-06-13: 1000 mg

## 2018-06-13 MED ORDER — FENTANYL CITRATE (PF) 100 MCG/2ML IJ SOLN
INTRAMUSCULAR | Status: AC
  Filled 2018-06-13: qty 2

## 2018-06-13 MED ORDER — FENTANYL CITRATE (PF) 100 MCG/2ML IJ SOLN: 25.0000 ug | INTRAMUSCULAR | Status: DC | PRN

## 2018-06-13 MED ORDER — MUPIROCIN 2 % EX OINT
1.0000 "application " | TOPICAL_OINTMENT | Freq: Two times a day (BID) | CUTANEOUS | Status: DC
  Administered 2018-06-13 – 2018-06-15 (×2): 1 via NASAL
  Filled 2018-06-13: qty 22

## 2018-06-13 MED ORDER — PHENYLEPHRINE HCL 10 MG/ML IJ SOLN
INTRAMUSCULAR | Status: DC | PRN
  Administered 2018-06-13: 100 ug via INTRAVENOUS

## 2018-06-13 MED ORDER — LIDOCAINE HCL (CARDIAC) PF 100 MG/5ML IV SOSY
PREFILLED_SYRINGE | INTRAVENOUS | Status: DC | PRN
  Administered 2018-06-13: 60 mg via INTRAVENOUS

## 2018-06-13 MED ORDER — HYDROCODONE-ACETAMINOPHEN 5-325 MG PO TABS
1.0000 | ORAL_TABLET | ORAL | Status: DC | PRN
  Administered 2018-06-13 – 2018-06-16 (×4): 1 via ORAL
  Filled 2018-06-13 (×5): qty 1

## 2018-06-13 MED ORDER — ONDANSETRON HCL 4 MG/2ML IJ SOLN
INTRAMUSCULAR | Status: AC
  Filled 2018-06-13: qty 2

## 2018-06-13 MED ORDER — APIXABAN 2.5 MG PO TABS
2.5000 mg | ORAL_TABLET | Freq: Two times a day (BID) | ORAL | Status: DC
  Administered 2018-06-14 – 2018-06-16 (×5): 2.5 mg via ORAL
  Filled 2018-06-13 (×5): qty 1

## 2018-06-13 MED ORDER — LIDOCAINE HCL (PF) 2 % IJ SOLN
INTRAMUSCULAR | Status: AC
  Filled 2018-06-13: qty 10

## 2018-06-13 MED ORDER — OSELTAMIVIR PHOSPHATE 30 MG PO CAPS: 30.0000 mg | ORAL_CAPSULE | Freq: Two times a day (BID) | ORAL | Status: DC

## 2018-06-13 MED ORDER — INSULIN ASPART 100 UNIT/ML ~~LOC~~ SOLN
9.0000 [IU] | Freq: Once | SUBCUTANEOUS | Status: AC
  Administered 2018-06-13: 9 [IU] via SUBCUTANEOUS

## 2018-06-13 SURGICAL SUPPLY — 58 items
BANDAGE ACE 4X5 VEL STRL LF (GAUZE/BANDAGES/DRESSINGS) ×5 IMPLANT
BLADE MED AGGRESSIVE (BLADE) ×3 IMPLANT
BLADE OSC/SAGITTAL MD 5.5X18 (BLADE) ×3 IMPLANT
BLADE SURG 15 STRL LF DISP TIS (BLADE) ×2 IMPLANT
BLADE SURG 15 STRL SS (BLADE) ×6
BLADE SURG MINI STRL (BLADE) ×3 IMPLANT
BNDG CONFORM 2 STRL LF (GAUZE/BANDAGES/DRESSINGS) ×3 IMPLANT
BNDG CONFORM 3 STRL LF (GAUZE/BANDAGES/DRESSINGS) ×2 IMPLANT
BNDG ESMARK 4X12 TAN STRL LF (GAUZE/BANDAGES/DRESSINGS) ×3 IMPLANT
BNDG GAUZE 4.5X4.1 6PLY STRL (MISCELLANEOUS) ×7 IMPLANT
CANISTER SUCT 1200ML W/VALVE (MISCELLANEOUS) ×3 IMPLANT
CLOSURE WOUND 1/4X4 (GAUZE/BANDAGES/DRESSINGS) ×1
CNTNR SPEC 2.5X3XGRAD LEK (MISCELLANEOUS) ×1
CONT SPEC 4OZ STER OR WHT (MISCELLANEOUS) ×2
CONT SPEC 4OZ STRL OR WHT (MISCELLANEOUS) ×1
CONTAINER SPEC 2.5X3XGRAD LEK (MISCELLANEOUS) IMPLANT
COVER WAND RF STERILE (DRAPES) ×3 IMPLANT
CUFF TOURN 18 STER (MISCELLANEOUS) ×3 IMPLANT
CUFF TOURN DUAL PL 12 NO SLV (MISCELLANEOUS) ×1 IMPLANT
DRAPE FLUOR MINI C-ARM 54X84 (DRAPES) ×3 IMPLANT
DRSG TELFA 4X3 1S NADH ST (GAUZE/BANDAGES/DRESSINGS) ×2 IMPLANT
DURAPREP 26ML APPLICATOR (WOUND CARE) ×3 IMPLANT
ELECT REM PT RETURN 9FT ADLT (ELECTROSURGICAL) ×3
ELECTRODE REM PT RTRN 9FT ADLT (ELECTROSURGICAL) ×1 IMPLANT
GAUZE PETRO XEROFOAM 1X8 (MISCELLANEOUS) ×3 IMPLANT
GAUZE SPONGE 4X4 12PLY STRL (GAUZE/BANDAGES/DRESSINGS) ×3 IMPLANT
GAUZE XEROFORM 1X8 LF (GAUZE/BANDAGES/DRESSINGS) ×2 IMPLANT
GLOVE BIO SURGEON STRL SZ7.5 (GLOVE) ×3 IMPLANT
GLOVE INDICATOR 8.0 STRL GRN (GLOVE) ×3 IMPLANT
GOWN STRL REUS W/ TWL LRG LVL3 (GOWN DISPOSABLE) ×2 IMPLANT
GOWN STRL REUS W/TWL LRG LVL3 (GOWN DISPOSABLE) ×6
HANDPIECE VERSAJET DEBRIDEMENT (MISCELLANEOUS) ×3 IMPLANT
KIT STIMULAN RAPID CURE 5CC (Orthopedic Implant) ×2 IMPLANT
KIT TURNOVER KIT A (KITS) ×3 IMPLANT
LABEL OR SOLS (LABEL) ×3 IMPLANT
NDL FILTER BLUNT 18X1 1/2 (NEEDLE) ×1 IMPLANT
NDL HYPO 25X1 1.5 SAFETY (NEEDLE) ×2 IMPLANT
NEEDLE FILTER BLUNT 18X 1/2SAF (NEEDLE) ×2
NEEDLE FILTER BLUNT 18X1 1/2 (NEEDLE) ×1 IMPLANT
NEEDLE HYPO 25X1 1.5 SAFETY (NEEDLE) ×6 IMPLANT
NS IRRIG 500ML POUR BTL (IV SOLUTION) ×3 IMPLANT
PACK EXTREMITY ARMC (MISCELLANEOUS) ×3 IMPLANT
PAD ABD DERMACEA PRESS 5X9 (GAUZE/BANDAGES/DRESSINGS) ×4 IMPLANT
SOL .9 NS 3000ML IRR  AL (IV SOLUTION) ×2
SOL .9 NS 3000ML IRR AL (IV SOLUTION) ×1
SOL .9 NS 3000ML IRR UROMATIC (IV SOLUTION) ×1 IMPLANT
SOL PREP PVP 2OZ (MISCELLANEOUS) ×3
SOLUTION PREP PVP 2OZ (MISCELLANEOUS) ×1 IMPLANT
SPONGE LAP 18X18 RF (DISPOSABLE) ×2 IMPLANT
STOCKINETTE STRL 6IN 960660 (GAUZE/BANDAGES/DRESSINGS) ×3 IMPLANT
STRIP CLOSURE SKIN 1/4X4 (GAUZE/BANDAGES/DRESSINGS) ×2 IMPLANT
SUT ETHILON 3-0 FS-10 30 BLK (SUTURE) ×6
SUT ETHILON 4-0 (SUTURE)
SUT ETHILON 4-0 FS2 18XMFL BLK (SUTURE)
SUTURE EHLN 3-0 FS-10 30 BLK (SUTURE) ×1 IMPLANT
SUTURE ETHLN 4-0 FS2 18XMF BLK (SUTURE) IMPLANT
SWAB DUAL CULTURE TRANS RED ST (MISCELLANEOUS) ×3 IMPLANT
SYR 10ML LL (SYRINGE) ×3 IMPLANT

## 2018-06-13 NOTE — Progress Notes (Signed)
[name], [name] ([id number]) Visit Report for 06/09/2018 Chief Complaint Document Details Patient Name: [name], [name]. Date of Service: 06/09/2018 10:30 AM Medical Record Number: [id number] Patient Account Number: [id number] Date of Birth/Sex: September 15, 1931 (85 y.o. M) Treating RN: Cornell Barman Primary Care Provider: Webb Silversmith Other Clinician: Referring Provider: Webb Silversmith Treating Provider/Extender: Melburn Hake, Myrakle Wingler Weeks in Treatment: 3 Information Obtained from: Patient Chief Complaint Left lateral LE ulcer Electronic Signature(s) Signed: 06/09/2018 11:27:08 AM By: Worthy Keeler PA-C Entered By: Worthy Keeler on 06/09/2018 10:37:37 Sendejo, [name] ([id number]) -------------------------------------------------------------------------------- HPI Details Patient Name: [name] T. Date of Service: 06/09/2018 10:30 AM Medical Record Number: [id number] Patient Account Number: [id number] Date of Birth/Sex: 28-Aug-1931 (85 y.o. M) Treating RN: Cornell Barman Primary Care Provider: Webb Silversmith Other Clinician: Referring Provider: Webb Silversmith Treating Provider/Extender: Melburn Hake, Terrell Ostrand Weeks in Treatment: 3 History of Present Illness Associated Signs and Symptoms: Patient has a history of lymphedema, type II diabetes mellitus, long-term use of Eliquis, congestive heart failure, and COPD. HPI Description: 12/06/17 on evaluation today patient is here for evaluation of an ulcer on the left lateral lower extremity which has been present for roughly 2-3 months. Currently he has been using silver alginate and it was considered putting the patient in an [employer] wrap. However he did have arterial studies which actually showed to be fairly normal on the right however on the left he did have an ABI of 0.75. With that being said this ABI was consistent with at least mild-to-moderate arterial disease. The patient actually has some question on the test as to whether or not this result was  actually even accurate his arterial disease could be more significant. For that reason I think he may need to see a vascular specialist to see about possibly undergoing an angiogram to see if arterial flow could be improved. With that being said it does appear that the patient rather desperately needs compression therapy he has significant edema of the bilateral lower extremities noted during evaluation today. No fevers, chills, nausea, or vomiting noted at this time. 12/16/17 on evaluation today patient actually appears to be doing better in regard to his lower extremity ulcers of the left lower extremity. Both wounds are significantly smaller and improved compared to the initial evaluation last week. Overall I'm very pleased with the progress at this time. The patient likewise is pleased with how things appear. He states that he has thought about it and really does not want to have an angiogram that would prefer not to go see the vascular specialist which we had recommended last week. He states when he had this previous on top of the fact that he had issues with getting the groin area to stop bleeding he states that the die also seemed to cause issues in fact he states through him into having another heart attack. Obviously he has good reasons to want to try to avoid this which I completely understand. 01/02/18 on evaluation today patient actually appears to be showing signs of good improvement which is excellent news. He has been tolerating the dressing changes without complication. Unfortunately he was in the hospital due to an infection of his left forearm he was on IV antibiotics for six days. The good news is he seems to be doing much better in that regard his left lower extremity also is doing better at this point. There does not appear to be any evidence of infection at this time which is great news. Overall I'm  very pleased with the progress he seems to be making. 01/30/18 patient has not  been seen our office actually since 23 September. At that point following he apparently healed but then had several areas that we opened shortly following. He has new areas in three different spots on evaluation today. This is on the left lower extremity were the ones previously were. With that being said there does not appear to be any evidence of infection at this time which is good news. No fevers, chills, nausea, or vomiting noted at this time. 02/10/18 on evaluation today patient actually appears to be doing excellent in regard to his lower extremities. He just has one wound that appears to still be open at this time. There does not appear to be any evidence of infection currently. With that being said he has been tolerating the compression that we been utilizing without complication. No fevers, chills, nausea, or vomiting noted at this time. 02/24/18 on evaluation today patient's lower extremity ulcer actually shows signs of good improvement at this time. Fortunately there is no evidence of infection. Overall he has some pain but nothing too significant. 03/17/18 on evaluation today patient appears to be doing better in regard to the ulcer on his left lower extremity. This is roughly half the size it was when I saw him last at least. Maybe more. Nonetheless overall he seems to be doing excellent with the Current wound care measures. 03/31/18 on evaluation today patient actually appears to be doing very well in regard to his lower extremity ulcer. He does have two areas that are leaking on the anterior portion of his lower extremity but no true ulcerations. The ulcer on the lateral portion of his lower extremity actually is doing better although it still is a little bit open at this point. It has a good granulation surface. [name], [name] ([id number]) Readmission 05/18/2018 85 year old male with history of lymphedema, type II diabetes mellitus, long-term use of Eliquis, congestive  heart failure, and COPD. He was last seen at wound center December 2019. Presents today with bilateral wounds to his great toes and a wound to the left fifth metatarsal head area. Increased erythema and edema of the left lower leg. Unable to obtain ABIs. He has a small pin tip size area that is draining fluid. He did mention that there has been an increase in his weight up 185 pounds. He was instructed by his cardiologist to take his prescribed metolazone medication with elevated weight parameters >180 pounds. Inquired how often he has had to take metolazone for weight gain and he stated only once over the last couple of days. Encouraged him to follow-up with his cardiologist for heart failure management and weight fluctuation. VSS. Denies recent fever, chills, dizziness, or SOB. No acute distress during exam today. 05/26/18 patient presents today for reevaluation concerning ongoing issues with his right great toe as well as left great toe and medial first metatarsal region. He has been tolerating the dressing changes without complication. Fortunately there's no signs of infection systemically at this point. No fevers, chills, nausea, or vomiting noted at this time. Unfortunately he still has some erythema noted he did complete the Keflex for the initial seven days although I think he may need additional treatment to make sure this fully clears away. He still has not gotten the x-ray that we sent them for last week. He never obtain the arterial studies previous either he actually healed and then subsequently states that they never called him. 06/09/18  on evaluation today patient actually appears to be doing more poorly compared to last time I saw him in regard to the left lower extremity. This appears to be infected which is not good news at all. It has definitely declined since last time I saw him. He did have his arterial studies which revealed that he has poor circulation with a TBI of 0.17 in the  left great toe and his ABI was 0.75. His right was a little bit better but still not the best. His left leg is what I'm most concerned about especially in light of the fact that he appears to have cellulitis today and this is definitely change compared to last time I saw him. I am concerned about the risk of sepsis and again it does appear that he likely has critical limb ischemia which is developing into gangrene. My suggestion is gonna be that he go to the ER for rapid evaluation upon leaving our office today. Electronic Signature(s) Signed: 06/09/2018 11:27:08 AM By: Worthy Keeler PA-C Entered By: Worthy Keeler on 06/09/2018 11:23:10 [name] ([id number]) -------------------------------------------------------------------------------- Physical Exam Details Patient Name: [name] T. Date of Service: 06/09/2018 10:30 AM Medical Record Number: [id number] Patient Account Number: [id number] Date of Birth/Sex: Dec 21, 1931 (85 y.o. M) Treating RN: Cornell Barman Primary Care Provider: Webb Silversmith Other Clinician: Referring Provider: Webb Silversmith Treating Provider/Extender: STONE III, Nazaiah Navarrete Weeks in Treatment: 3 Constitutional Well-nourished and well-hydrated in no acute distress. Respiratory normal breathing without difficulty. clear to auscultation bilaterally. Cardiovascular regular rate and rhythm with normal S1, S2. Absent posterior tibial and dorsalis pedis pulses left lower extremity. 2+ pitting edema of the bilateral lower extremities. Psychiatric this patient is able to make decisions and demonstrates good insight into disease process. Alert and Oriented x 3. pleasant and cooperative. Notes Upon inspection patient have significant erythema of the of the left great toe and first metatarsal region which is definitely new upon evaluation today. Socially I think that he needs to have this evaluated the ER ASAP. He does have poor pulses in regard to this left foot and  again his arterial studies revealed very poor blood flow with a TBI of 0.17 and an ABI of 0.75. I think she does have critical limb ischemia at this point and coupled with the infection he's at risk for sepsis and/or loss of limb. Electronic Signature(s) Signed: 06/09/2018 11:27:08 AM By: Worthy Keeler PA-C Entered By: Worthy Keeler on 06/09/2018 11:25:07 [name] ([id number]) -------------------------------------------------------------------------------- Physician Orders Details Patient Name: [name] T. Date of Service: 06/09/2018 10:30 AM Medical Record Number: [id number] Patient Account Number: [id number] Date of Birth/Sex: 31-Mar-1932 (85 y.o. M) Treating RN: Cornell Barman Primary Care Provider: Webb Silversmith Other Clinician: Referring Provider: Webb Silversmith Treating Provider/Extender: Melburn Hake, Analiya Porco Weeks in Treatment: 3 Verbal / Phone Orders: No Diagnosis Coding ICD-10 Coding Code Description I89.0 Lymphedema, not elsewhere classified E11.622 Type 2 diabetes mellitus with other skin ulcer 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 specified severity L97.518 Non-pressure chronic ulcer of other part of right foot with other specified severity L03.116 Cellulitis of left lower limb Z79.01 Long term (current) use of anticoagulants I50.42 Chronic combined systolic (congestive) and diastolic (congestive) heart failure J44.9 Chronic obstructive pulmonary disease, unspecified Wound Cleansing Wound #10 Left,Lateral Calcaneus o Cleanse wound with mild soap and water Wound #6 Right Toe Great o Cleanse wound with mild soap and water Wound #7 Left Toe Great o Cleanse wound with  mild soap and water Wound #8 Left,Medial Metatarsal head first o Cleanse wound with mild soap and water Wound #9 Left,Posterior Calcaneus o Cleanse wound with mild soap and water Primary Wound Dressing Wound #10 Left,Lateral Calcaneus o Alginate - over  betadine o Other: - betadine paint Wound #6 Right Toe Great o Alginate - over betadine o Other: - betadine paint Wound #7 Left Toe Great o Alginate - over betadine o Other: - betadine paint Wound #8 Left,Medial Metatarsal head first o Alginate - over betadine MORGEN, RITACCO T. ([id number]) o Other: - betadine paint Wound #9 Left,Posterior Calcaneus o Alginate - over betadine o Other: - betadine paint Secondary Dressing Wound #10 Left,Lateral Calcaneus o Dry Gauze o Conform/Kerlix - secure with tape and netting Wound #6 Right Toe Great o Dry Gauze o Conform/Kerlix - secure with tape and netting Wound #7 Left Toe Great o Dry Gauze o Conform/Kerlix - secure with tape and netting Wound #8 Left,Medial Metatarsal head first o Dry Gauze o Conform/Kerlix - secure with tape and netting Wound #9 Left,Posterior Calcaneus o Dry Gauze o Conform/Kerlix - secure with tape and netting Dressing Change Frequency Wound #10 Left,Lateral Calcaneus o Change dressing every other day. Wound #6 Right Toe Great o Change dressing every other day. Wound #7 Left Toe Great o Change dressing every other day. Wound #8 Left,Medial Metatarsal head first o Change dressing every other day. Wound #9 Left,Posterior Calcaneus o Change dressing every other day. Follow-up Appointments Wound #10 Left,Lateral Calcaneus o Return Appointment in 1 week. Wound #6 Right Toe Great o Return Appointment in 1 week. Wound #7 Left Toe Great o Return Appointment in 1 week. Wound #8 Left,Medial Metatarsal head first o Return Appointment in 1 week. CEDAR, DITULLIO ([id number]) Wound #9 Left,Posterior Calcaneus o Return Appointment in 1 week. Off-Loading Wound #10 Left,Lateral Calcaneus o Open toe surgical shoe to: - Both feet Wound #6 Right Toe Great o Open toe surgical shoe to: - Both feet Wound #7 Left Toe Great o Open toe surgical shoe to: -  Both feet Wound #8 Left,Medial Metatarsal head first o Open toe surgical shoe to: - Both feet Wound #9 Left,Posterior Calcaneus o Open toe surgical shoe to: - Both feet Home Health Wound #10 Left,Lateral Calcaneus o [place] Nurse may visit PRN to address patientos wound care needs. o FACE TO FACE ENCOUNTER: MEDICARE and MEDICAID PATIENTS: I certify that this patient is under my care and that I had a face-to-face encounter that meets the physician face-to-face encounter requirements with this patient on this date. The encounter with the patient was in whole or in part for the following MEDICAL CONDITION: (primary reason for [place]) MEDICAL NECESSITY: I certify, that based on my findings, NURSING services are a medically necessary home health service. HOME BOUND STATUS: I certify that my clinical findings support that this patient is homebound (i.e., Due to illness or injury, pt requires aid of supportive devices such as crutches, cane, wheelchairs, walkers, the use of special transportation or the assistance of another person to leave their place of residence. There is a normal inability to leave the home and doing so requires considerable and taxing effort. Other absences are for medical reasons / religious services and are infrequent or of short duration when for other reasons). o If current dressing causes regression in wound condition, may D/C ordered dressing product/s and apply Normal Saline Moist Dressing daily until next [place] / Other MD  appointment. [place] of regression in wound condition at [phone]. o Please direct any NON-WOUND related issues/requests for orders to patient's Primary Care Physician Wound #6 Right Toe [place] Nurse may visit PRN to address patientos wound care needs. o FACE TO FACE ENCOUNTER:  MEDICARE and MEDICAID PATIENTS: I certify that this patient is under my care and that I had a face-to-face encounter that meets the physician face-to-face encounter requirements with this patient on this date. The encounter with the patient was in whole or in part for the following MEDICAL CONDITION: (primary reason for [place]) MEDICAL NECESSITY: I certify, that based on my findings, NURSING services are a medically necessary home health service. HOME BOUND STATUS: I certify that my clinical findings support that this patient is homebound (i.e., Due to illness or injury, pt requires aid of supportive devices such as crutches, cane, wheelchairs, walkers, the use of special transportation or the assistance of another person to leave their place of residence. There is a normal inability to leave the home and doing so requires considerable and taxing effort. Other absences are for medical reasons / religious services and are infrequent or of short duration when for other reasons). o If current dressing causes regression in wound condition, may D/C ordered dressing product/s and apply Normal Saline Moist Dressing daily until next [place] / Other MD appointment. [place] of regression in wound condition at [phone]. o Please direct any NON-WOUND related issues/requests for orders to patient's Primary Care Physician NICODEMUS, DENK ([id number]) Wound #7 Left [place] Nurse may visit PRN to address patientos wound care needs. o FACE TO FACE ENCOUNTER: MEDICARE and MEDICAID PATIENTS: I certify that this patient is under my care and that I had a face-to-face encounter that meets the physician face-to-face encounter requirements with this patient on this date. The encounter with the patient was in whole or in part for the following MEDICAL CONDITION: (primary reason for [place]) MEDICAL  NECESSITY: I certify, that based on my findings, NURSING services are a medically necessary home health service. HOME BOUND STATUS: I certify that my clinical findings support that this patient is homebound (i.e., Due to illness or injury, pt requires aid of supportive devices such as crutches, cane, wheelchairs, walkers, the use of special transportation or the assistance of another person to leave their place of residence. There is a normal inability to leave the home and doing so requires considerable and taxing effort. Other absences are for medical reasons / religious services and are infrequent or of short duration when for other reasons). o If current dressing causes regression in wound condition, may D/C ordered dressing product/s and apply Normal Saline Moist Dressing daily until next [place] / Other MD appointment. [place] of regression in wound condition at [phone]. o Please direct any NON-WOUND related issues/requests for orders to patient's Primary Care Physician Wound #8 Left,Medial Metatarsal head first o [place] Visits - [facility] o Home Health Nurse may visit PRN to address patientos wound care needs. o FACE TO FACE ENCOUNTER: MEDICARE and MEDICAID PATIENTS: I certify that this patient is under my care and that I had a face-to-face encounter that meets the physician face-to-face encounter requirements with this patient on this date. The encounter with the patient was in whole or in part for the following MEDICAL CONDITION: (  primary reason for Home Healthcare) MEDICAL NECESSITY: I certify, that based on my findings, NURSING services are a medically necessary home health service. HOME BOUND STATUS: I certify that my clinical findings support that this patient is homebound (i.e., Due to illness or injury, pt requires aid of supportive devices such as crutches, cane, wheelchairs, walkers, the use of special transportation  or the assistance of another person to leave their place of residence. There is a normal inability to leave the home and doing so requires considerable and taxing effort. Other absences are for medical reasons / religious services and are infrequent or of short duration when for other reasons). o If current dressing causes regression in wound condition, may D/C ordered dressing product/s and apply Normal Saline Moist Dressing daily until next [place] / Other MD appointment. [place] of regression in wound condition at [phone]. o Please direct any NON-WOUND related issues/requests for orders to patient's Primary Care Physician Wound #9 [place] Nurse may visit PRN to address patientos wound care needs. o FACE TO FACE ENCOUNTER: MEDICARE and MEDICAID PATIENTS: I certify that this patient is under my care and that I had a face-to-face encounter that meets the physician face-to-face encounter requirements with this patient on this date. The encounter with the patient was in whole or in part for the following MEDICAL CONDITION: (primary reason for [place]) MEDICAL NECESSITY: I certify, that based on my findings, NURSING services are a medically necessary home health service. HOME BOUND STATUS: I certify that my clinical findings support that this patient is homebound (i.e., Due to illness or injury, pt requires aid of supportive devices such as crutches, cane, wheelchairs, walkers, the use of special transportation or the assistance of another person to leave their place of residence. There is a normal inability to leave the home and doing so requires considerable and taxing effort. Other absences are for medical reasons / religious services and are infrequent or of short duration when for other reasons). o If current dressing causes regression in wound condition, may D/C  ordered dressing product/s and apply Normal Saline Moist Dressing daily until next [facility] / Other MD appointment. [place] of regression in wound condition at [phone]. o Please direct any NON-WOUND related issues/requests for orders to patient's Primary Care Physician Notes Patient to go [facility] ED for evaluation and treatment of infected wounds. FAHED, MORTEN ([id number]) Electronic Signature(s) Signed: 06/09/2018 11:27:08 AM By: Worthy Keeler PA-C Signed: 06/12/2018 4:00:49 PM By: Gretta Cool, BSN, RN, CWS, Kim RN, BSN Entered By: Gretta Cool, BSN, RN, CWS, Kim on 06/09/2018 11:22:05 [name] ([id number]) -------------------------------------------------------------------------------- Problem List Details Patient Name: [name], [name]. Date of Service: 06/09/2018 10:30 AM Medical Record Number: [id number] Patient Account Number: [id number] Date of Birth/Sex: February 28, 1932 (85 y.o. M) Treating RN: Cornell Barman Primary Care Provider: Webb Silversmith Other Clinician: Referring Provider: Webb Silversmith Treating Provider/Extender: Melburn Hake, Dayanna Pryce Weeks in Treatment: 3 Active Problems ICD-10 Evaluated Encounter Code Description Active Date Today Diagnosis I89.0 Lymphedema, not elsewhere classified 05/18/2018 No Yes E11.622 Type 2 diabetes mellitus with other skin ulcer 05/18/2018 No Yes 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 05/26/2018 No Yes specified severity L97.518 Non-pressure chronic ulcer of other part of right foot with 05/26/2018 No Yes other specified severity L03.116 Cellulitis of left lower limb 05/18/2018 No Yes Z79.01 Long term (current) use of anticoagulants  05/18/2018 No Yes I50.42 Chronic combined systolic (congestive) and diastolic 5/0/3546 No Yes (congestive) heart failure J44.9 Chronic obstructive pulmonary disease, unspecified 05/18/2018 No Yes Inactive Problems Resolved Problems ICD-10 Code Description  Active Date Resolved Date [name], [name] ([id number]) L97.818 Non-pressure chronic ulcer of other part of right lower leg with other 05/18/2018 05/20/2018 specified severity Electronic Signature(s) Signed: 06/09/2018 11:27:08 AM By: Worthy Keeler PA-C Entered By: Worthy Keeler on 06/09/2018 10:37:33 [name], [name] ([id number]) -------------------------------------------------------------------------------- Progress Note Details Patient Name: [name] T. Date of Service: 06/09/2018 10:30 AM Medical Record Number: [id number] Patient Account Number: [id number] Date of Birth/Sex: [birth year] (85 y.o. M) Treating RN: Cornell Barman Primary Care Provider: Webb Silversmith Other Clinician: Referring Provider: Webb Silversmith Treating Provider/Extender: Melburn Hake, Etty Isaac Weeks in Treatment: 3 Subjective Chief Complaint Information obtained from Patient Left lateral LE ulcer History of Present Illness (HPI) The following HPI elements were documented for the patient's wound: Associated Signs and Symptoms: Patient has a history of lymphedema, type II diabetes mellitus, long-term use of Eliquis, congestive heart failure, and COPD. 12/06/17 on evaluation today patient is here for evaluation of an ulcer on the left lateral lower extremity which has been present for roughly 2-3 months. Currently he has been using silver alginate and it was considered putting the patient in an [employer] wrap. However he did have arterial studies which actually showed to be fairly normal on the right however on the left he did have an ABI of 0.75. With that being said this ABI was consistent with at least mild-to-moderate arterial disease. The patient actually has some question on the test as to whether or not this result was actually even accurate his arterial disease could be more significant. For that reason I think he may need to see a vascular specialist to see about possibly undergoing an angiogram to see if  arterial flow could be improved. With that being said it does appear that the patient rather desperately needs compression therapy he has significant edema of the bilateral lower extremities noted during evaluation today. No fevers, chills, nausea, or vomiting noted at this time. 12/16/17 on evaluation today patient actually appears to be doing better in regard to his lower extremity ulcers of the left lower extremity. Both wounds are significantly smaller and improved compared to the initial evaluation last week. Overall I'm very pleased with the progress at this time. The patient likewise is pleased with how things appear. He states that he has thought about it and really does not want to have an angiogram that would prefer not to go see the vascular specialist which we had recommended last week. He states when he had this previous on top of the fact that he had issues with getting the groin area to stop bleeding he states that the die also seemed to cause issues in fact he states through him into having another heart attack. Obviously he has good reasons to want to try to avoid this which I completely understand. 01/02/18 on evaluation today patient actually appears to be showing signs of good improvement which is excellent news. He has been tolerating the dressing changes without complication. Unfortunately he was in the hospital due to an infection of his left forearm he was on IV antibiotics for six days. The good news is he seems to be doing much better in that regard his left lower extremity also is doing better at this point. There does not appear to be any evidence of infection at this  time which is great news. Overall I'm very pleased with the progress he seems to be making. 01/30/18 patient has not been seen our office actually since 23 September. At that point following he apparently healed but then had several areas that we opened shortly following. He has new areas in three different  spots on evaluation today. This is on the left lower extremity were the ones previously were. With that being said there does not appear to be any evidence of infection at this time which is good news. No fevers, chills, nausea, or vomiting noted at this time. 02/10/18 on evaluation today patient actually appears to be doing excellent in regard to his lower extremities. He just has one wound that appears to still be open at this time. There does not appear to be any evidence of infection currently. With that being said he has been tolerating the compression that we been utilizing without complication. No fevers, chills, nausea, or vomiting noted at this time. 02/24/18 on evaluation today patient's lower extremity ulcer actually shows signs of good improvement at this time. Fortunately there is no evidence of infection. Overall he has some pain but nothing too significant. [name], [name] ([id number]) 03/17/18 on evaluation today patient appears to be doing better in regard to the ulcer on his left lower extremity. This is roughly half the size it was when I saw him last at least. Maybe more. Nonetheless overall he seems to be doing excellent with the Current wound care measures. 03/31/18 on evaluation today patient actually appears to be doing very well in regard to his lower extremity ulcer. He does have two areas that are leaking on the anterior portion of his lower extremity but no true ulcerations. The ulcer on the lateral portion of his lower extremity actually is doing better although it still is a little bit open at this point. It has a good granulation surface. Readmission 05/18/2018 85 year old male with history of lymphedema, type II diabetes mellitus, long-term use of Eliquis, congestive heart failure, and COPD. He was last seen at wound center December 2019. Presents today with bilateral wounds to his great toes and a wound to the left fifth metatarsal head area. Increased erythema and  edema of the left lower leg. Unable to obtain ABIs. He has a small pin tip size area that is draining fluid. He did mention that there has been an increase in his weight up 185 pounds. He was instructed by his cardiologist to take his prescribed metolazone medication with elevated weight parameters >180 pounds. Inquired how often he has had to take metolazone for weight gain and he stated only once over the last couple of days. Encouraged him to follow-up with his cardiologist for heart failure management and weight fluctuation. VSS. Denies recent fever, chills, dizziness, or SOB. No acute distress during exam today. 05/26/18 patient presents today for reevaluation concerning ongoing issues with his right great toe as well as left great toe and medial first metatarsal region. He has been tolerating the dressing changes without complication. Fortunately there's no signs of infection systemically at this point. No fevers, chills, nausea, or vomiting noted at this time. Unfortunately he still has some erythema noted he did complete the Keflex for the initial seven days although I think he may need additional treatment to make sure this fully clears away. He still has not gotten the x-ray that we sent them for last week. He never obtain the arterial studies previous either he actually healed and then subsequently  states that they never called him. 06/09/18 on evaluation today patient actually appears to be doing more poorly compared to last time I saw him in regard to the left lower extremity. This appears to be infected which is not good news at all. It has definitely declined since last time I saw him. He did have his arterial studies which revealed that he has poor circulation with a TBI of 0.17 in the left great toe and his ABI was 0.75. His right was a little bit better but still not the best. His left leg is what I'm most concerned about especially in light of the fact that he appears to have  cellulitis today and this is definitely change compared to last time I saw him. I am concerned about the risk of sepsis and again it does appear that he likely has critical limb ischemia which is developing into gangrene. My suggestion is gonna be that he go to the ER for rapid evaluation upon leaving our office today. Patient History Information obtained from Patient. Family History Diabetes - Maternal Grandparents, Heart Disease - Paternal Grandparents, Hypertension - Paternal Grandparents, Lung Disease - Mother, No family history of Cancer, Hereditary Spherocytosis, Kidney Disease, Seizures, Stroke, Thyroid Problems, Tuberculosis. Social History Former smoker - quit in 1993, Marital Status - Married, Alcohol Use - Never, Drug Use - No History, Caffeine Use - Daily. Medical History Eyes Denies history of Cataracts, Glaucoma, Optic Neuritis Ear/Nose/Mouth/Throat Denies history of Chronic sinus problems/congestion, Middle ear problems Hematologic/Lymphatic Denies history of Anemia, Hemophilia, Human Immunodeficiency Virus, Lymphedema, Sickle Cell Disease Respiratory Patient has history of Chronic Obstructive Pulmonary Disease (COPD), Sleep Apnea - CPAP Denies history of Aspiration, Asthma, Pneumothorax, Tuberculosis Cardiovascular Patient has history of Arrhythmia - a fib, Congestive Heart Failure, Hypertension, Myocardial Infarction - 1997 Denies history of Angina, Coronary Artery Disease, Deep Vein Thrombosis, Hypotension, Peripheral Arterial Disease, Bentley, Bonner T. ([id number]) Peripheral Venous Disease, Phlebitis, Vasculitis Gastrointestinal Denies history of Cirrhosis , Colitis, Crohn s, Hepatitis A, Hepatitis B, Hepatitis C Endocrine Patient has history of Type II Diabetes Denies history of Type I Diabetes Genitourinary Denies history of End Stage Renal Disease Immunological Denies history of Lupus Erythematosus, Raynaud s, Scleroderma Integumentary (Skin) Denies history  of History of Burn, History of pressure wounds Musculoskeletal Patient has history of Osteoarthritis Denies history of Gout, Rheumatoid Arthritis, Osteomyelitis Neurologic Patient has history of Neuropathy Denies history of Dementia, Quadriplegia, Paraplegia, Seizure Disorder Oncologic Patient has history of Received Radiation - 5 treatments in chest Denies history of Received Chemotherapy Psychiatric Denies history of Anorexia/bulimia, Confinement Anxiety Medical And Surgical History Notes Neurologic CVA in 1999 Review of Systems (ROS) Constitutional Symptoms (General Health) Denies complaints or symptoms of Fever, Chills. Respiratory The patient has no complaints or symptoms. Cardiovascular The patient has no complaints or symptoms. Psychiatric The patient has no complaints or symptoms. Objective Constitutional Well-nourished and well-hydrated in no acute distress. Vitals Time Taken: 10:31 AM, Height: 66 in, Weight: 180 lbs, BMI: 29, Temperature: 97.8 F, Pulse: 81 bpm, Respiratory Rate: 16 breaths/min, Blood Pressure: 120/72 mmHg. Respiratory normal breathing without difficulty. clear to auscultation bilaterally. Cardiovascular [name], [name] T. ([id number]) regular rate and rhythm with normal S1, S2. Absent posterior tibial and dorsalis pedis pulses left lower extremity. 2+ pitting edema of the bilateral lower extremities. Psychiatric this patient is able to make decisions and demonstrates good insight into disease process. Alert and Oriented x 3. pleasant and cooperative. General Notes: Upon inspection patient have significant erythema of the of the  left great toe and first metatarsal region which is definitely new upon evaluation today. Socially I think that he needs to have this evaluated the ER ASAP. He does have poor pulses in regard to this left foot and again his arterial studies revealed very poor blood flow with a TBI of 0.17 and an ABI of 0.75. I think she does  have critical limb ischemia at this point and coupled with the infection he's at risk for sepsis and/or loss of limb. Integumentary (Hair, Skin) Wound #10 status is Open. Original cause of wound was Pressure Injury. The wound is located on the Left,Lateral Calcaneus. The wound measures 1cm length x 2.4cm width x 0.1cm depth; 1.885cm^2 area and 0.188cm^3 volume. There is no tunneling or undermining noted. There is a medium amount of serous drainage noted. There is no granulation within the wound bed. There is a large (67-100%) amount of necrotic tissue within the wound bed including Adherent Slough. The periwound skin appearance did not exhibit: Callus, Crepitus, Excoriation, Induration, Rash, Scarring, Dry/Scaly, Maceration, Atrophie Blanche, Cyanosis, Ecchymosis, Hemosiderin Staining, Mottled, Pallor, Rubor, Erythema. The periwound has tenderness on palpation. Wound #6 status is Open. Original cause of wound was Gradually Appeared. The wound is located on the Right Toe Great. The wound measures 0.5cm length x 0.5cm width x 0.1cm depth; 0.196cm^2 area and 0.02cm^3 volume. The wound is limited to skin breakdown. There is no tunneling or undermining noted. There is a none present amount of drainage noted. The wound margin is flat and intact. There is no granulation within the wound bed. There is a large (67-100%) amount of necrotic tissue within the wound bed including Eschar. The periwound skin appearance exhibited: Dry/Scaly. The periwound skin appearance did not exhibit: Callus, Crepitus, Excoriation, Induration, Rash, Scarring, Maceration, Atrophie Blanche, Cyanosis, Ecchymosis, Hemosiderin Staining, Mottled, Pallor, Rubor, Erythema. Periwound temperature was noted as No Abnormality. The periwound has tenderness on palpation. Wound #7 status is Open. Original cause of wound was Gradually Appeared. The wound is located on the Left Toe Great. The wound measures 1cm length x 1cm width x 0.1cm  depth; 0.785cm^2 area and 0.079cm^3 volume. There is Fat Layer (Subcutaneous Tissue) Exposed exposed. There is no tunneling or undermining noted. There is a none present amount of drainage noted. The wound margin is flat and intact. There is no granulation within the wound bed. There is a large (67-100%) amount of necrotic tissue within the wound bed including Eschar. The periwound skin appearance exhibited: Cyanosis, Erythema. The periwound skin appearance did not exhibit: Callus, Crepitus, Excoriation, Induration, Rash, Scarring, Dry/Scaly, Maceration, Atrophie Blanche, Ecchymosis, Hemosiderin Staining, Mottled, Pallor, Rubor. The surrounding wound skin color is noted with erythema. Periwound temperature was noted as No Abnormality. The periwound has tenderness on palpation. General Notes: pt c/o intermittent pain to left great toe and foot. takes tylenol with no relief. enc to see primary PRN. Wound #8 status is Open. Original cause of wound was Gradually Appeared. The wound is located on the Left,Medial Metatarsal head first. The wound measures 2.5cm length x 3.2cm width x 0.1cm depth; 6.283cm^2 area and 0.628cm^3 volume. There is Fat Layer (Subcutaneous Tissue) Exposed exposed. There is no tunneling or undermining noted. There is a large amount of purulent drainage noted. The wound margin is flat and intact. There is no granulation within the wound bed. There is a large (67-100%) amount of necrotic tissue within the wound bed including Eschar. The periwound skin appearance exhibited: Dry/Scaly, Cyanosis. The periwound skin appearance did not exhibit: Callus,  Crepitus, Excoriation, Induration, Rash, Scarring, Maceration, Atrophie Blanche, Ecchymosis, Hemosiderin Staining, Mottled, Pallor, Rubor, Erythema. Periwound temperature was noted as No Abnormality. The periwound has tenderness on palpation. Wound #9 status is Open. Original cause of wound was Gradually Appeared. The wound is located on  the Left,Posterior Calcaneus. The wound measures 1.5cm length x 0.5cm width x 0.1cm depth; 0.589cm^2 area and 0.059cm^3 volume. There is Fat Layer (Subcutaneous Tissue) Exposed exposed. There is a small amount of serous drainage noted. There is no granulation within the wound bed. There is a large (67-100%) amount of necrotic tissue within the wound bed including Adherent Slough. The periwound skin appearance did not exhibit: Callus, Crepitus, Excoriation, Induration, Rash, Scarring, Dry/Scaly, Maceration, Atrophie Blanche, Cyanosis, Ecchymosis, Hemosiderin Staining, Mottled, Pallor, Rubor, Erythema. [name], [name] ([id number]) Assessment Active Problems ICD-10 Lymphedema, not elsewhere classified Type 2 diabetes mellitus with other skin ulcer Non-pressure chronic ulcer of other part of left foot with other specified severity Non-pressure chronic ulcer of other part of right foot with other specified severity Cellulitis of left lower limb Long term (current) use of anticoagulants Chronic combined systolic (congestive) and diastolic (congestive) heart failure Chronic obstructive pulmonary disease, unspecified Plan Wound Cleansing: Wound #10 Left,Lateral Calcaneus: Cleanse wound with mild soap and water Wound #6 Right Toe Great: Cleanse wound with mild soap and water Wound #7 Left Toe Great: Cleanse wound with mild soap and water Wound #8 Left,Medial Metatarsal head first: Cleanse wound with mild soap and water Wound #9 Left,Posterior Calcaneus: Cleanse wound with mild soap and water Primary Wound Dressing: Wound #10 Left,Lateral Calcaneus: Alginate - over betadine Other: - betadine paint Wound #6 Right Toe Great: Alginate - over betadine Other: - betadine paint Wound #7 Left Toe Great: Alginate - over betadine Other: - betadine paint Wound #8 Left,Medial Metatarsal head first: Alginate - over betadine Other: - betadine paint Wound #9 Left,Posterior Calcaneus: Alginate -  over betadine Other: - betadine paint Secondary Dressing: Wound #10 Left,Lateral Calcaneus: Dry Gauze Conform/[place] - secure with tape and netting Wound #6 Right Toe Great: Dry Gauze Conform/[place] - secure with tape and netting Wound #7 Left Toe GreatHANS, [name] ([id number]) Dry Gauze Conform/[place] - secure with tape and netting Wound #8 Left,Medial Metatarsal head first: Dry Gauze Conform/[place] - secure with tape and netting Wound #9 Left,Posterior Calcaneus: Dry Gauze Conform/[place] - secure with tape and netting Dressing Change Frequency: Wound #10 Left,Lateral Calcaneus: Change dressing every other day. Wound #6 Right Toe Great: Change dressing every other day. Wound #7 Left Toe Great: Change dressing every other day. Wound #8 Left,Medial Metatarsal head first: Change dressing every other day. Wound #9 Left,Posterior Calcaneus: Change dressing every other day. Follow-up Appointments: Wound #10 Left,Lateral Calcaneus: Return Appointment in 1 week. Wound #6 Right Toe Great: Return Appointment in 1 week. Wound #7 Left Toe Great: Return Appointment in 1 week. Wound #8 Left,Medial Metatarsal head first: Return Appointment in 1 week. Wound #9 Left,Posterior Calcaneus: Return Appointment in 1 week. Off-Loading: Wound #10 Left,Lateral Calcaneus: Open toe surgical shoe to: - Both feet Wound #6 Right Toe Great: Open toe surgical shoe to: - Both feet Wound #7 Left Toe Great: Open toe surgical shoe to: - Both feet Wound #8 Left,Medial Metatarsal head first: Open toe surgical shoe to: - Both feet Wound #9 Left,Posterior Calcaneus: Open toe surgical shoe to: - Both feet Home Health: Wound #10 Left,Lateral Calcaneus: [place] Nurse may visit PRN to address patient s wound care needs.  FACE TO FACE ENCOUNTER: MEDICARE and MEDICAID PATIENTS: I certify that this patient is under my care and that I had a face-to-face  encounter that meets the physician face-to-face encounter requirements with this patient on this date. The encounter with the patient was in whole or in part for the following MEDICAL CONDITION: (primary reason for [place]) MEDICAL NECESSITY: I certify, that based on my findings, NURSING services are a medically necessary home health service. HOME BOUND STATUS: I certify that my clinical findings support that this patient is homebound (i.e., Due to illness or injury, pt requires aid of supportive devices such as crutches, cane, wheelchairs, walkers, the use of special transportation or the assistance of another person to leave their place of residence. There is a normal inability to leave the home and doing so requires considerable and taxing effort. Other absences are for medical reasons / religious services and are infrequent or of short duration when for other reasons). If current dressing causes regression in wound condition, may D/C ordered dressing product/s and apply Normal Saline Moist Dressing daily until next [place] / Other MD appointment. [place] of regression in wound condition at [phone]. Please direct any NON-WOUND related issues/requests for orders to patient's Primary Care Physician Wound #6 Right Toe Great: [place] Visits - [facility] DELL, BRINER ([id number]) Home Health Nurse may visit PRN to address patient s wound care needs. FACE TO FACE ENCOUNTER: MEDICARE and MEDICAID PATIENTS: I certify that this patient is under my care and that I had a face-to-face encounter that meets the physician face-to-face encounter requirements with this patient on this date. The encounter with the patient was in whole or in part for the following MEDICAL CONDITION: (primary reason for [place]) MEDICAL NECESSITY: I certify, that based on my findings, NURSING services are a medically necessary home health service. HOME BOUND  STATUS: I certify that my clinical findings support that this patient is homebound (i.e., Due to illness or injury, pt requires aid of supportive devices such as crutches, cane, wheelchairs, walkers, the use of special transportation or the assistance of another person to leave their place of residence. There is a normal inability to leave the home and doing so requires considerable and taxing effort. Other absences are for medical reasons / religious services and are infrequent or of short duration when for other reasons). If current dressing causes regression in wound condition, may D/C ordered dressing product/s and apply Normal Saline Moist Dressing daily until next [place] / Other MD appointment. [place] of regression in wound condition at [phone]. Please direct any NON-WOUND related issues/requests for orders to patient's Primary Care Physician Wound #7 Left Toe Great: [place] Nurse may visit PRN to address patient s wound care needs. FACE TO FACE ENCOUNTER: MEDICARE and MEDICAID PATIENTS: I certify that this patient is under my care and that I had a face-to-face encounter that meets the physician face-to-face encounter requirements with this patient on this date. The encounter with the patient was in whole or in part for the following MEDICAL CONDITION: (primary reason for [place]) MEDICAL NECESSITY: I certify, that based on my findings, NURSING services are a medically necessary home health service. HOME BOUND STATUS: I certify that my clinical findings support that this patient is homebound (i.e., Due to illness or injury, pt requires aid of supportive devices such as crutches, cane, wheelchairs, walkers, the use of special transportation  or the assistance of another person to leave their place of residence. There is a normal inability to leave the home and doing so requires considerable and taxing  effort. Other absences are for medical reasons / religious services and are infrequent or of short duration when for other reasons). If current dressing causes regression in wound condition, may D/C ordered dressing product/s and apply Normal Saline Moist Dressing daily until next [place] / Other MD appointment. [place] of regression in wound condition at [phone]. Please direct any NON-WOUND related issues/requests for orders to patient's Primary Care Physician Wound #8 Left,Medial Metatarsal head first: [place] Nurse may visit PRN to address patient s wound care needs. FACE TO FACE ENCOUNTER: MEDICARE and MEDICAID PATIENTS: I certify that this patient is under my care and that I had a face-to-face encounter that meets the physician face-to-face encounter requirements with this patient on this date. The encounter with the patient was in whole or in part for the following MEDICAL CONDITION: (primary reason for [place]) MEDICAL NECESSITY: I certify, that based on my findings, NURSING services are a medically necessary home health service. HOME BOUND STATUS: I certify that my clinical findings support that this patient is homebound (i.e., Due to illness or injury, pt requires aid of supportive devices such as crutches, cane, wheelchairs, walkers, the use of special transportation or the assistance of another person to leave their place of residence. There is a normal inability to leave the home and doing so requires considerable and taxing effort. Other absences are for medical reasons / religious services and are infrequent or of short duration when for other reasons). If current dressing causes regression in wound condition, may D/C ordered dressing product/s and apply Normal Saline Moist Dressing daily until next [place] / Other MD appointment. [place] of regression in wound  condition at [phone]. Please direct any NON-WOUND related issues/requests for orders to patient's Primary Care Physician Wound #9 Left,Posterior Calcaneus: [place] Nurse may visit PRN to address patient s wound care needs. FACE TO FACE ENCOUNTER: MEDICARE and MEDICAID PATIENTS: I certify that this patient is under my care and that I had a face-to-face encounter that meets the physician face-to-face encounter requirements with this patient on this date. The encounter with the patient was in whole or in part for the following MEDICAL CONDITION: (primary reason for [place]) MEDICAL NECESSITY: I certify, that based on my findings, NURSING services are a medically necessary home health service. HOME BOUND STATUS: I certify that my clinical findings support that this patient is homebound (i.e., Due to illness or injury, pt requires aid of supportive devices such as crutches, cane, wheelchairs, walkers, the use of special transportation or the assistance of another person to leave their place of residence. There is a normal inability to leave the home and doing so requires considerable and taxing effort. Other absences are for medical reasons / religious services and are infrequent or of short duration when for other reasons). If current dressing causes regression in wound condition, may D/C ordered dressing product/s and apply Normal Saline Moist Dressing daily until next [place] / Other MD appointment. [place] of regression in [place], [place] ([id number]) wound condition at [phone]. Please direct any NON-WOUND related issues/requests for orders to patient's Primary Care Physician General Notes: Patient to go to [facility] ED for evaluation and treatment  of infected wounds. My suggestion at this point is gonna be that we have him go to the ER ASAP for further evaluation and treatment. The patient is in agreement  the plan. We will subsequently see were things stand once he is seen at the ER and has the evaluation as needed. I'm hopeful that they will be able to consult with vascular to see what can be done potentially for him. He may also need a surgical consult as well is infectious disease. Again that will be left up to the ER physicians although again I think that this is definitely a significant issue that needs to be addressed today not any other time later in the week. I explained this to the patient and he should be leaving here and going to the ER upon discharge from the clinic today. I do not feel he needs to go by the way of EMS. We will see were things stand at follow-up. Electronic Signature(s) Signed: 06/09/2018 11:27:08 AM By: Worthy Keeler PA-C Entered By: Worthy Keeler on 06/09/2018 11:26:06 [name] ([id number]) -------------------------------------------------------------------------------- ROS/PFSH Details Patient Name: [name] T. Date of Service: 06/09/2018 10:30 AM Medical Record Number: [id number] Patient Account Number: [id number] Date of Birth/Sex: 1932-01-17 (85 y.o. M) Treating RN: Cornell Barman Primary Care Provider: Webb Silversmith Other Clinician: Referring Provider: Webb Silversmith Treating Provider/Extender: Melburn Hake, Jule Whitsel Weeks in Treatment: 3 Information Obtained From Patient Wound History Do you currently have one or more open woundso Yes How many open wounds do you currently haveo 3 Approximately how long have you had your woundso 1 week Has your wound(s) ever healed and then re-openedo No Have you had any lab work done in the past montho No Have you tested positive for an antibiotic resistant organism (MRSA, VRE)o No Have you tested positive for osteomyelitis (bone infection)o No Have you had any tests for circulation on your legso No Have you had other problems associated with your woundso Swelling Constitutional Symptoms (General  Health) Complaints and Symptoms: Negative for: Fever; Chills Eyes Medical History: Negative for: Cataracts; Glaucoma; Optic Neuritis Ear/Nose/Mouth/Throat Medical History: Negative for: Chronic sinus problems/congestion; Middle ear problems Hematologic/Lymphatic Medical History: Negative for: Anemia; Hemophilia; Human Immunodeficiency Virus; Lymphedema; Sickle Cell Disease Respiratory Complaints and Symptoms: No Complaints or Symptoms Medical History: Positive for: Chronic Obstructive Pulmonary Disease (COPD); Sleep Apnea - CPAP Negative for: Aspiration; Asthma; Pneumothorax; Tuberculosis Cardiovascular Complaints and Symptoms: No Complaints or Symptoms Medical History: Positive for: Arrhythmia - a fib; Congestive Heart Failure; Hypertension; Myocardial Infarction - [place] ([id number]) Negative for: Angina; Coronary Artery Disease; Deep Vein Thrombosis; Hypotension; Peripheral Arterial Disease; Peripheral Venous Disease; Phlebitis; Vasculitis Gastrointestinal Medical History: Negative for: Cirrhosis ; Colitis; Crohnos; Hepatitis A; Hepatitis B; Hepatitis C Endocrine Medical History: Positive for: Type II Diabetes Negative for: Type I Diabetes Treated with: Insulin, Oral agents Blood sugar tested every day: Yes Tested : QD Genitourinary Medical History: Negative for: End Stage Renal Disease Immunological Medical History: Negative for: Lupus Erythematosus; Raynaudos; Scleroderma Integumentary (Skin) Medical History: Negative for: History of Burn; History of pressure wounds Musculoskeletal Medical History: Positive for: Osteoarthritis Negative for: Gout; Rheumatoid Arthritis; Osteomyelitis Neurologic Medical History: Positive for: Neuropathy Negative for: Dementia; Quadriplegia; Paraplegia; Seizure Disorder Past Medical History Notes: CVA in [place] History: Positive for: Received Radiation - 5 treatments in chest Negative for:  Received Chemotherapy Psychiatric Complaints and Symptoms: No Complaints or Symptoms Medical History: Negative for: Anorexia/bulimia; Confinement Anxiety [name], Shawn T. ([id number]) Immunizations Pneumococcal  Vaccine: Received Pneumococcal Vaccination: Yes Implantable Devices No devices added Family and Social History Cancer: No; Diabetes: Yes - Maternal Grandparents; Heart Disease: Yes - Paternal Grandparents; Hereditary Spherocytosis: No; Hypertension: Yes - Paternal Grandparents; Kidney Disease: No; Lung Disease: Yes - Mother; Seizures: No; Stroke: No; Thyroid Problems: No; Tuberculosis: No; Former smoker - quit in 1993; Marital Status - Married; Alcohol Use: Never; Drug Use: No History; Caffeine Use: Daily; Financial Concerns: No; Food, Clothing or Shelter Needs: No; Support System Lacking: No; Transportation Concerns: No; Advanced Directives: No; Patient does not want information on Advanced Directives Physician Affirmation I have reviewed and agree with the above information. Electronic Signature(s) Signed: 06/09/2018 11:27:08 AM By: Worthy Keeler PA-C Signed: 06/12/2018 4:00:49 PM By: Gretta Cool, BSN, RN, CWS, Kim RN, BSN Entered By: Worthy Keeler on 06/09/2018 11:23:41 [name], [name] ([id number]) -------------------------------------------------------------------------------- SuperBill Details Patient Name: [name] T. Date of Service: 06/09/2018 Medical Record Number: [id number] Patient Account Number: [id number] Date of Birth/Sex: [birth year] (85 y.o. M) Treating RN: Cornell Barman Primary Care Provider: Webb Silversmith Other Clinician: Referring Provider: Webb Silversmith Treating Provider/Extender: Melburn Hake, Leaman Abe Weeks in Treatment: 3 Diagnosis Coding ICD-10 Codes Code Description I89.0 Lymphedema, not elsewhere classified E11.622 Type 2 diabetes mellitus with other skin ulcer 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 specified  severity L97.518 Non-pressure chronic ulcer of other part of right foot with other specified severity L03.116 Cellulitis of left lower limb Z79.01 Long term (current) use of anticoagulants I50.42 Chronic combined systolic (congestive) and diastolic (congestive) heart failure J44.9 Chronic obstructive pulmonary disease, unspecified Facility Procedures CPT4 Code: [id number] Description: 99214 - WOUND CARE VISIT-LEV 4 EST PT Modifier: Quantity: 1 Physician Procedures CPT4 Code Description: [id number] - WC PHYS LEVEL 4 - EST PT ICD-10 Diagnosis Description I89.0 Lymphedema, not elsewhere classified E11.622 Type 2 diabetes mellitus with other skin ulcer L97.528 Non-pressure chronic ulcer of other part of left foot  with othe L97.518 Non-pressure chronic ulcer of other part of right foot with oth Modifier: r specified sever er specified seve Quantity: 1 ity rity Electronic Signature(s) Signed: 06/09/2018 11:27:08 AM By: Worthy Keeler PA-C Entered By: Worthy Keeler on 06/09/2018 11:26:42

## 2018-06-13 NOTE — Transfer of Care (Signed)
Immediate Anesthesia Transfer of Care Note  Patient: [name].  Procedure(s) Performed: 1st Ray Resection Left (Left Toe)  Patient Location: PACU  Anesthesia Type:General  Level of Consciousness: sedated and responds to stimulation  Airway & Oxygen Therapy: Patient Spontanous Breathing and Patient connected to face mask oxygen  Post-op Assessment: Report given to RN and Post -op Vital signs reviewed and stable  Post vital signs: Reviewed and stable  Last Vitals:  Vitals Value Taken Time  BP 118/60 06/13/2018  4:02 PM  Temp 36.2 C 06/13/2018  4:02 PM  Pulse 68 06/13/2018  4:04 PM  Resp 17 06/13/2018  4:04 PM  SpO2 100 % 06/13/2018  4:04 PM  Vitals shown include unvalidated device data.  Last Pain:  Vitals:   06/13/18 1602  TempSrc:   PainSc: Asleep      Patients Stated Pain Goal: 3 (83/66/29 4765)  Complications: No apparent anesthesia complications

## 2018-06-13 NOTE — Progress Notes (Signed)
WHALEN, TROMPETER ([id number]) Visit Report for 06/09/2018 Arrival Information Details Patient Name: [name], [name]. Date of Service: 06/09/2018 10:30 AM Medical Record Number: [id number] Patient Account Number: [id number] Date of Birth/Sex: 01-27-1932 (85 y.o. M) Treating RN: Secundino Ginger Primary Care Katessa Attridge: Webb Silversmith Other Clinician: Referring Jemuel Laursen: Webb Silversmith Treating Joshuwa Vecchio/Extender: Melburn Hake, HOYT Weeks in Treatment: 3 Visit Information History Since Last Visit Added or deleted any medications: No Patient Arrived: Walker Any new allergies or adverse reactions: No Arrival Time: 10:30 Had a fall or experienced change in No Accompanied By: son activities of daily living that may affect Transfer Assistance: None risk of falls: Patient Identification Verified: Yes Signs or symptoms of abuse/neglect since last visito No Secondary Verification Process Yes Hospitalized since last visit: No Completed: Implantable device outside of the clinic excluding No Patient Has Alerts: Yes cellular tissue based products placed in the center Patient Alerts: Patient on Blood since last visit: Thinner Has Dressing in Place as Prescribed: Yes Eliquis Pain Present Now: No DMII 05/18/2018 ABI RIGHT 1.29 LEFT - 0.74 Electronic Signature(s) Signed: 06/09/2018 12:49:50 PM By: Gretta Cool, BSN, RN, CWS, Kim RN, BSN Previous Signature: 06/09/2018 10:55:59 AM Version By: Gretta Cool, BSN, RN, CWS, Kim RN, BSN Entered By: Gretta Cool, BSN, RN, CWS, Kim on 06/09/2018 12:49:50 Staff, [name] ([id number]) -------------------------------------------------------------------------------- Clinic Level of Care Assessment Details Patient Name: [name], Shawn T. Date of Service: 06/09/2018 10:30 AM Medical Record Number: [id number] Patient Account Number: [id number] Date of Birth/Sex: [birth year] (85 y.o. M) Treating RN: Cornell Barman Primary Care Felma Pfefferle: Webb Silversmith Other Clinician: Referring Renita Brocks: Webb Silversmith Treating Faizah Kandler/Extender: Melburn Hake, HOYT Weeks in Treatment: 3 Clinic Level of Care Assessment Items TOOL 4 Quantity Score []  - Use when only an EandM is performed on FOLLOW-UP visit 0 ASSESSMENTS - Nursing Assessment / Reassessment []  - Reassessment of Co-morbidities (includes updates in patient status) 0 X- 1 5 Reassessment of Adherence to Treatment Plan ASSESSMENTS - Wound and Skin Assessment / Reassessment X - Simple Wound Assessment / Reassessment - one wound 1 5 []  - 0 Complex Wound Assessment / Reassessment - multiple wounds []  - 0 Dermatologic / Skin Assessment (not related to wound area) ASSESSMENTS - Focused Assessment []  - Circumferential Edema Measurements - multi extremities 0 []  - 0 Nutritional Assessment / Counseling / Intervention []  - 0 Lower Extremity Assessment (monofilament, tuning fork, pulses) []  - 0 Peripheral Arterial Disease Assessment (using hand held doppler) ASSESSMENTS - Ostomy and/or Continence Assessment and Care []  - Incontinence Assessment and Management 0 []  - 0 Ostomy Care Assessment and Management (repouching, etc.) PROCESS - Coordination of Care X - Simple Patient / Family Education for ongoing care 1 15 []  - 0 Complex (extensive) Patient / Family Education for ongoing care X- 1 10 Staff obtains Programmer, systems, Records, Test Results / Process Orders []  - 0 Staff telephones HHA, Nursing Homes / Clarify orders / etc []  - 0 Routine Transfer to another Facility (non-emergent condition) X- 1 10 Routine Hospital Admission (non-emergent condition) []  - 0 New Admissions / Biomedical engineer / Ordering NPWT, Apligraf, etc. []  - 0 Emergency Hospital Admission (emergent condition) X- 1 10 Simple Discharge Coordination LENNOX, DOLBERRY T. ([id number]) []  - 0 Complex (extensive) Discharge Coordination PROCESS - Special Needs []  - Pediatric / Minor Patient Management 0 []  - 0 Isolation Patient Management []  - 0 Hearing / Language /  Visual special needs []  - 0 Assessment of Community assistance (transportation, D/C planning, etc.) []  - 0 Additional assistance / Altered mentation []  -  0 Support Surface(s) Assessment (bed, cushion, seat, etc.) INTERVENTIONS - Wound Cleansing / Measurement []  - Simple Wound Cleansing - one wound 0 X- 4 5 Complex Wound Cleansing - multiple wounds X- 1 5 Wound Imaging (photographs - any number of wounds) []  - 0 Wound Tracing (instead of photographs) []  - 0 Simple Wound Measurement - one wound X- 4 5 Complex Wound Measurement - multiple wounds INTERVENTIONS - Wound Dressings []  - Small Wound Dressing one or multiple wounds 0 X- 2 15 Medium Wound Dressing one or multiple wounds []  - 0 Large Wound Dressing one or multiple wounds []  - 0 Application of Medications - topical []  - 0 Application of Medications - injection INTERVENTIONS - Miscellaneous []  - External ear exam 0 []  - 0 Specimen Collection (cultures, biopsies, blood, body fluids, etc.) []  - 0 Specimen(s) / Culture(s) sent or taken to Lab for analysis []  - 0 Patient Transfer (multiple staff / Civil Service fast streamer / Similar devices) []  - 0 Simple Staple / Suture removal (25 or less) []  - 0 Complex Staple / Suture removal (26 or more) []  - 0 Hypo / Hyperglycemic Management (close monitor of Blood Glucose) []  - 0 Ankle / Brachial Index (ABI) - do not check if billed separately X- 1 5 Vital Signs Gautier, Trevaughn T. ([id number]) Has the patient been seen at the hospital within the last three years: Yes Total Score: 135 Level Of Care: New/Established - Level 4 Electronic Signature(s) Signed: 06/12/2018 4:00:49 PM By: Gretta Cool, BSN, RN, CWS, Kim RN, BSN Entered By: Gretta Cool, BSN, RN, CWS, Kim on 06/09/2018 11:23:51 [name] ([id number]) -------------------------------------------------------------------------------- Encounter Discharge Information Details Patient Name: Shawn Bark T. Date of Service: 06/09/2018 10:30  AM Medical Record Number: [id number] Patient Account Number: [id number] Date of Birth/Sex: 07/28/1931 (85 y.o. M) Treating RN: Army Melia Primary Care Emmory Solivan: Webb Silversmith Other Clinician: Referring Eryc Bodey: Webb Silversmith Treating Sahana Boyland/Extender: Melburn Hake, HOYT Weeks in Treatment: 3 Encounter Discharge Information Items Discharge Condition: Stable Ambulatory Status: Walker Discharge Destination: Emergency Room Telephoned: Yes Orders Sent: Yes Transportation: Private Auto Accompanied By: son Schedule Follow-up Appointment: Yes Clinical Summary of Care: Electronic Signature(s) Signed: 06/09/2018 12:47:49 PM By: Army Melia Entered By: Army Melia on 06/09/2018 11:38:26 Spraker, [name] ([id number]) -------------------------------------------------------------------------------- Lower Extremity Assessment Details Patient Name: Shawn Bark T. Date of Service: 06/09/2018 10:30 AM Medical Record Number: [id number] Patient Account Number: [id number] Date of Birth/Sex: 21-Apr-1931 (85 y.o. M) Treating RN: Secundino Ginger Primary Care Joniel Graumann: Webb Silversmith Other Clinician: Referring Chaunda Vandergriff: Webb Silversmith Treating Areyana Leoni/Extender: Melburn Hake, HOYT Weeks in Treatment: 3 Edema Assessment Assessed: [Left: No] [Right: No] [Left: Edema] [Right: :] Calf Left: Right: Point of Measurement: 30 cm From Medial Instep 34 cm 34 cm Ankle Left: Right: Point of Measurement: 12 cm From Medial Instep 23 cm 24 cm Vascular Assessment Pulses: Dorsalis Pedis Palpable: [Left:Yes] [Right:Yes] Posterior Tibial Extremity colors, hair growth, and conditions: Extremity Color: [Left:Red] [Right:Normal] Hair Growth on Extremity: [Left:No] [Right:No] Temperature of Extremity: [Left:Warm] [Right:Warm] Capillary Refill: [Left:< 3 seconds] [Right:< 3 seconds] Toe Nail Assessment Left: Right: Thick: Yes Yes Discolored: Yes Yes Deformed: Yes Yes Improper Length and Hygiene: No No Electronic  Signature(s) Signed: 06/09/2018 2:18:07 PM By: Secundino Ginger Entered By: Secundino Ginger on 06/09/2018 11:03:12 Holdman, [name] ([id number]) -------------------------------------------------------------------------------- Multi Wound Chart Details Patient Name: Shawn Bark T. Date of Service: 06/09/2018 10:30 AM Medical Record Number: [id number] Patient Account Number: [id number] Date of Birth/Sex: 04-23-1931 (85 y.o. 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] [7:Arterial Insufficiency Ulcer] Secondary Etiology: [10:N/A] [6:Diabetic Wound/Ulcer of the Lower Extremity] [7:Diabetic Wound/Ulcer of the Lower Extremity] Comorbid History: [10:Chronic Obstructive Pulmonary Disease (COPD), Sleep Apnea, Arrhythmia, Congestive Heart Failure, Hypertension, Myocardial Infarction, Type II Diabetes, Osteoarthritis, Neuropathy, Received Radiation] [6:Chronic Obstructive Pulmonary  Disease (COPD), Sleep Apnea, Arrhythmia, Congestive Heart Failure, Hypertension, Myocardial Infarction, Type II Diabetes, Osteoarthritis, Neuropathy, Received Radiation] [7:Chronic Obstructive Pulmonary Disease (COPD), Sleep Apnea, Arrhythmia,  Congestive Heart Failure, Hypertension, Myocardial Infarction, Type II Diabetes, Osteoarthritis, Neuropathy, Received Radiation] Date Acquired: [10:05/29/2018] [6:05/03/2018]  [7:05/03/2018] Weeks of Treatment: [10:0] [6:3] [7:3] Wound Status: [10:Open] [6:Open] [7:Open] Pending Amputation on [10:No] [6:Yes] [7:Yes] Presentation: Measurements L x W x D [10:1x2.4x0.1] [6:0.5x0.5x0.1] [7:1x1x0.1] (cm) Area (cm) : [10:1.885] [6:0.196] [7:0.785] Volume (cm) : [10:0.188] [6:0.02] [7:0.079] % Reduction in Area: [10:0.00%] [6:-24.80%] [7:51.00%] % Reduction in Volume: [10:0.00%] [6:-25.00%] [7:50.60%] Classification: [10:Grade 2] [6:Unclassifiable] [7:Full Thickness Without Exposed Support Structures] Exudate Amount: [10:Medium] [6:None Present] [7:None Present] Exudate Type: [10:Serous] [6:N/A] [7:N/A] Exudate Color: [10:amber] [6:N/A] [7:N/A] Wound Margin: [10:N/A] [6:Flat and Intact] [7:Flat and Intact] Granulation Amount: [10:None Present (0%)] [6:None Present (0%)] [7:None Present (0%)] Necrotic Amount: [10:Large (67-100%)] [6:Large (67-100%)] [7:Large (67-100%)] Necrotic Tissue: [10:Adherent Slough] [6:Eschar] [7:Eschar] Epithelialization: [10:N/A] [6:None] [7:None] Periwound Skin Texture: [name], Shawn T. ([id number]) Excoriation: No Excoriation: No Excoriation: No Induration: No Induration: No Induration: No Callus: No Callus: No Callus: No Crepitus: No Crepitus: No Crepitus: No Rash: No Rash: No Rash: No Scarring: No Scarring: No Scarring: No Periwound Skin Moisture: Maceration: No Dry/Scaly: Yes Maceration: No Dry/Scaly: No Maceration: No Dry/Scaly: No Periwound Skin Color: Atrophie Blanche: No Atrophie Blanche: No Cyanosis: Yes Cyanosis: No Cyanosis: No Erythema: Yes Ecchymosis: No Ecchymosis: No Atrophie Blanche: No Erythema: No Erythema: No Ecchymosis: No Hemosiderin Staining: No Hemosiderin Staining: No Hemosiderin Staining: No Mottled: No Mottled: No Mottled: No Pallor: No Pallor: No Pallor: No Rubor: No Rubor: No Rubor: No Temperature: N/A No Abnormality No Abnormality Tenderness on Palpation: Yes Yes  Yes Wound Preparation: Ulcer Cleansing: Ulcer Cleansing: Ulcer Cleansing: Rinsed/Irrigated with Saline Rinsed/Irrigated with Saline Rinsed/Irrigated with Saline Topical Anesthetic Applied: Topical Anesthetic Applied: Topical Anesthetic Applied: Other: lidocaine 4% Other: lidocaine 4% Other: lidocaine 4% Assessment Notes: N/A N/A pt c/o intermittent pain to left great toe and foot. takes tylenol with no relief. enc to see primary PRN. 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 N/A Secondary Etiology: Diabetic Wound/Ulcer of the N/A N/A Lower Extremity Comorbid History: Chronic Obstructive Chronic Obstructive N/A Pulmonary Disease (COPD), Pulmonary Disease (COPD), Sleep Apnea, Arrhythmia, Sleep Apnea, Arrhythmia, Congestive Heart Failure, Congestive Heart Failure, Hypertension, Myocardial Hypertension, Myocardial Infarction, Type II Diabetes, Infarction, Type II Diabetes, Osteoarthritis, Neuropathy, Osteoarthritis, Neuropathy, Received Radiation Received Radiation Date Acquired: 05/03/2018 05/29/2018 N/A Weeks of Treatment: 3 0 N/A Wound Status: Open Open N/A Pending Amputation on Yes No N/A Presentation: Measurements L x W x D 2.5x3.2x0.1 1.5x0.5x0.1 N/A (cm) Area (  cm) : 6.283 0.589 N/A Volume (cm) : 0.628 0.059 N/A % Reduction in Area: -376.30% 0.00% N/A % Reduction in Volume: -375.80% 0.00% N/A Classification: Full Thickness Without Full Thickness Without N/A Exposed Support Structures Exposed Support Structures Exudate Amount: Large Small N/A MARISOL, GLAZER. ([id number]) Exudate Type: Purulent Serous N/A Exudate Color: yellow, brown, green amber N/A Wound Margin: Flat and Intact N/A N/A Granulation Amount: None Present (0%) None Present  (0%) N/A Necrotic Amount: Large (67-100%) Large (67-100%) N/A Necrotic Tissue: Eschar Adherent Slough N/A Exposed Structures: Fat Layer (Subcutaneous Fat Layer (Subcutaneous N/A Tissue) Exposed: Yes Tissue) Exposed: Yes Fascia: No Fascia: No Tendon: No Tendon: No Muscle: No Muscle: No Joint: No Joint: No Bone: No Bone: No Epithelialization: None N/A N/A Periwound Skin Texture: Excoriation: No Excoriation: No N/A Induration: No Induration: No Callus: No Callus: No Crepitus: No Crepitus: No Rash: No Rash: No Scarring: No Scarring: No Periwound Skin Moisture: Dry/Scaly: Yes Maceration: No N/A Maceration: No Dry/Scaly: No Periwound Skin Color: Cyanosis: Yes Atrophie Blanche: No N/A Atrophie Blanche: No Cyanosis: No Ecchymosis: No Ecchymosis: No Erythema: No Erythema: No Hemosiderin Staining: No Hemosiderin Staining: No Mottled: No Mottled: No Pallor: No Pallor: No Rubor: No Rubor: No Temperature: No Abnormality N/A N/A Tenderness on Palpation: Yes No N/A Wound Preparation: Ulcer Cleansing: Ulcer Cleansing: N/A Rinsed/Irrigated with Saline Rinsed/Irrigated with Saline Topical Anesthetic Applied: Topical Anesthetic Applied: Other: lidocaine 4% Other: lidocaine 4% Assessment Notes: N/A N/A N/A Treatment Notes Electronic Signature(s) Signed: 06/12/2018 4:00:49 PM By: Gretta Cool, BSN, RN, CWS, Kim RN, BSN Entered By: Gretta Cool, BSN, RN, CWS, Kim on 06/09/2018 11:16:50 [name] ([id number]) -------------------------------------------------------------------------------- [place] Details Patient Name: [name], Shawn T. Date of Service: 06/09/2018 10:30 AM Medical Record Number: [id number] Patient Account Number: [id number] Date of Birth/Sex: 1931/06/09 (85 y.o. M) Treating RN: Cornell Barman Primary Care Naveen Clardy: Webb Silversmith Other Clinician: Referring Myeesha Shane: Webb Silversmith Treating Irva Loser/Extender: Melburn Hake, HOYT Weeks in Treatment:  3 Active Inactive Necrotic Tissue Nursing Diagnoses: Impaired tissue integrity related to necrotic/devitalized tissue Knowledge deficit related to management of necrotic/devitalized tissue Goals: Necrotic/devitalized tissue will be minimized in the wound bed Date Initiated: 06/09/2018 Target Resolution Date: 06/16/2018 Goal Status: Active Interventions: Assess patient pain level pre-, during and post procedure and prior to discharge Treatment Activities: Apply topical anesthetic as ordered : 06/09/2018 Notes: Wound/Skin Impairment Nursing Diagnoses: Impaired tissue integrity Knowledge deficit related to ulceration/compromised skin integrity Goals: Ulcer/skin breakdown will have a volume reduction of 30% by week 4 Date Initiated: 05/18/2018 Target Resolution Date: 06/16/2018 Goal Status: Active Interventions: Assess patient/caregiver ability to obtain necessary supplies Assess patient/caregiver ability to perform ulcer/skin care regimen upon admission and as needed Assess ulceration(s) every visit Notes: Electronic Signature(s) Signed: 06/12/2018 4:00:49 PM By: Gretta Cool, BSN, RN, CWS, Kim RN, BSN Entered By: Gretta Cool, BSN, RN, CWS, Kim on 06/09/2018 11:16:40 [name] ([id number]) [name], Shawn T. ([id number]) -------------------------------------------------------------------------------- Pain Assessment Details Patient Name: Shawn Bark T. Date of Service: 06/09/2018 10:30 AM Medical Record Number: [id number] Patient Account Number: [id number] Date of Birth/Sex: November 08, 1931 (85 y.o. M) Treating RN: Secundino Ginger Primary Care Atari Novick: Webb Silversmith Other Clinician: Referring Adaja Wander: Webb Silversmith Treating Breyah Akhter/Extender: Melburn Hake, HOYT Weeks in Treatment: 3 Active Problems Location of Pain Severity and Description of Pain Patient Has Paino No Site Locations Pain Management and Medication Current Pain Management: Notes pt denies any pain at this time. Electronic  Signature(s) Signed: 06/09/2018 2:18:07 PM By: Secundino Ginger Entered By: Secundino Ginger on 06/09/2018 10:31:40 [name], [name] Kitchen ([id number]) -------------------------------------------------------------------------------- Patient/Caregiver Education Details Patient Name: VIHAAN, GLOSS T. Date of Service: 06/09/2018 10:30 AM Medical Record Number: [id number] Patient Account Number: [id number] Date of Birth/Gender: 1932/03/15 (85 y.o. M) Treating RN: Cornell Barman Primary Care Physician: Webb Silversmith Other Clinician: Referring Physician: Webb Silversmith Treating Physician/Extender: Sharalyn Ink in Treatment: 3 Education Assessment Education Provided To: Patient Education Topics Provided Wound/Skin Impairment: Handouts: Other: patient to go to emergency department Methods: Demonstration, Explain/Verbal Responses: State content correctly Electronic Signature(s) Signed: 06/12/2018 4:00:49 PM By: Gretta Cool, BSN, RN, CWS, Kim RN, BSN Entered By: Gretta Cool, BSN, RN, CWS, Kim on 06/09/2018 11:25:26 [name] ([id number]) -------------------------------------------------------------------------------- Wound Assessment Details Patient Name: Shawn Bark T. Date of Service: 06/09/2018 10:30 AM Medical Record Number: [id number] Patient Account Number: [id number] Date of Birth/Sex: 05-09-1931 (85 y.o. M) Treating RN: Secundino Ginger Primary Care Tiah Heckel: Webb Silversmith Other Clinician: Referring Parsa Rickett: Webb Silversmith Treating Graeson Nouri/Extender: STONE III, HOYT Weeks in Treatment: 3 Wound Status Wound Number: 10 Primary Diabetic Wound/Ulcer of the Lower Extremity Etiology: Wound Location: Left Calcaneus - Lateral Wound Open Wounding Event: Pressure Injury Status: Date Acquired: 05/29/2018 Comorbid Chronic Obstructive Pulmonary Disease (COPD), Weeks Of Treatment: 0 History: Sleep Apnea, Arrhythmia, Congestive Heart Clustered Wound: No Failure, Hypertension, Myocardial Infarction, Type II  Diabetes, Osteoarthritis, Neuropathy, Received Radiation Photos Photo Uploaded By: Secundino Ginger on 06/09/2018 11:21:08 Wound Measurements Length: (cm) 1 % Reduct Width: (cm) 2.4 % Reduct Depth: (cm) 0.1 Tunnelin Area: (cm) 1.885 Undermi Volume: (cm) 0.188 ion in Area: 0% ion in Volume: 0% g: No ning: No Wound Description Classification: Grade 2 Foul Odo Exudate Amount: Medium Slough/F Exudate Type: Serous Exudate Color: amber r After Cleansing: No ibrino Yes Wound Bed Granulation Amount: None Present (0%) Necrotic Amount: Large (67-100%) Necrotic Quality: Adherent Slough Periwound Skin Texture Texture Color No Abnormalities Noted: No No Abnormalities Noted: No Callus: No Atrophie Blanche: No [name], WITT. ([id number]) Crepitus: No Cyanosis: No Excoriation: No Ecchymosis: No Induration: No Erythema: No Rash: No Hemosiderin Staining: No Scarring: No Mottled: No Pallor: No Moisture Rubor: No No Abnormalities Noted: No Dry / Scaly: No Temperature / Pain Maceration: No Tenderness on Palpation: Yes Wound Preparation Ulcer Cleansing: Rinsed/Irrigated with Saline Topical Anesthetic Applied: Other: lidocaine 4%, Treatment Notes Wound #10 (Left, Lateral Calcaneus) Notes ABD pad , kerlix Electronic Signature(s) Signed: 06/09/2018 2:18:07 PM By: Secundino Ginger Entered By: Secundino Ginger on 06/09/2018 11:00:41 Limbaugh, [name] ([id number]) -------------------------------------------------------------------------------- Wound Assessment Details Patient Name: Shawn Bark T. Date of Service: 06/09/2018 10:30 AM Medical Record Number: [id number] Patient Account Number: [id number] Date of Birth/Sex: 05-28-1931 (85 y.o. M) Treating RN: Secundino Ginger Primary Care Shanise Balch: Webb Silversmith Other Clinician: Referring Stavroula Rohde: Webb Silversmith Treating Wing Gfeller/Extender: STONE III, HOYT Weeks in Treatment: 3 Wound Status Wound Number: 6 Primary Arterial Insufficiency  Ulcer Etiology: Wound Location: Right Toe Great Secondary Diabetic Wound/Ulcer of the Lower Extremity Wounding Event: Gradually Appeared Etiology: Date Acquired: 05/03/2018 Wound Open Weeks Of Treatment: 3 Status: Clustered Wound: No Comorbid Chronic Obstructive Pulmonary Disease Pending Amputation On Presentation History: (COPD), Sleep Apnea, Arrhythmia, Congestive Heart Failure, Hypertension, Myocardial Infarction, Type II Diabetes, Osteoarthritis, Neuropathy, Received Radiation Photos Photo Uploaded By: Secundino Ginger on 06/09/2018 11:19:13 Wound Measurements Length: (cm) 0.5 Width: (cm) 0.5 Depth: (cm) 0.1 Area: (cm) 0.196 Volume: (cm) 0.02 % Reduction in Area: -24.8% % Reduction in Volume: -25% Epithelialization: None Tunneling: No Undermining: No Wound Description Classification: Unclassifiable Foul Odor A Wound Margin: Flat and Intact Slough/Fibr Exudate Amount: None Present fter Cleansing: No  ino No Wound Bed Granulation Amount: None Present (0%) Exposed Structure Necrotic Amount: Large (67-100%) Fascia Exposed: No Necrotic Quality: Eschar Fat Layer (Subcutaneous Tissue) Exposed: No Tendon Exposed: No Muscle Exposed: No Joint Exposed: No Bone Exposed: No Arenz, Shawn T. ([id number]) Limited to Skin Breakdown Periwound Skin Texture Texture Color No Abnormalities Noted: No No Abnormalities Noted: No Callus: No Atrophie Blanche: No Crepitus: No Cyanosis: No Excoriation: No Ecchymosis: No Induration: No Erythema: No Rash: No Hemosiderin Staining: No Scarring: No Mottled: No Pallor: No Moisture Rubor: No No Abnormalities Noted: No Dry / Scaly: Yes Temperature / Pain Maceration: No Temperature: No Abnormality Tenderness on Palpation: Yes Wound Preparation Ulcer Cleansing: Rinsed/Irrigated with Saline Topical Anesthetic Applied: Other: lidocaine 4%, Treatment Notes Wound #6 (Right Toe Great) Notes coverlet Electronic  Signature(s) Signed: 06/09/2018 2:18:07 PM By: Secundino Ginger Entered By: Secundino Ginger on 06/09/2018 10:42:35 [name], [name] ([id number]) -------------------------------------------------------------------------------- Wound Assessment Details Patient Name: Shawn Bark T. Date of Service: 06/09/2018 10:30 AM Medical Record Number: [id number] Patient Account Number: [id number] Date of Birth/Sex: 1931-06-22 (85 y.o. M) Treating RN: Secundino Ginger Primary Care Arabelle Bollig: Webb Silversmith Other Clinician: Referring Gabryelle Whitmoyer: Webb Silversmith Treating Dennies Coate/Extender: STONE III, HOYT Weeks in Treatment: 3 Wound Status Wound Number: 7 Primary Arterial Insufficiency Ulcer Etiology: Wound Location: Left Toe Great Secondary Diabetic Wound/Ulcer of the Lower Extremity Wounding Event: Gradually Appeared Etiology: Date Acquired: 05/03/2018 Wound Open Weeks Of Treatment: 3 Status: Clustered Wound: No Comorbid Chronic Obstructive Pulmonary Disease Pending Amputation On Presentation History: (COPD), Sleep Apnea, Arrhythmia, Congestive Heart Failure, Hypertension, Myocardial Infarction, Type II Diabetes, Osteoarthritis, Neuropathy, Received Radiation Photos Photo Uploaded By: Secundino Ginger on 06/09/2018 11:19:14 Wound Measurements Length: (cm) 1 Width: (cm) 1 Depth: (cm) 0.1 Area: (cm) 0.785 Volume: (cm) 0.079 % Reduction in Area: 51% % Reduction in Volume: 50.6% Epithelialization: None Tunneling: No Undermining: No Wound Description Full Thickness Without Exposed Support Foul Odor Classification: Structures Slough/Fi Wound Margin: Flat and Intact Exudate None Present Amount: After Cleansing: No brino No Wound Bed Granulation Amount: None Present (0%) Exposed Structure Necrotic Amount: Large (67-100%) Fascia Exposed: No Necrotic Quality: Eschar Fat Layer (Subcutaneous Tissue) Exposed: Yes Tendon Exposed: No Muscle Exposed: No [name], Ebubechukwu T. ([id number]) Joint Exposed: No Bone  Exposed: No Periwound Skin Texture Texture Color No Abnormalities Noted: No No Abnormalities Noted: No Callus: No Atrophie Blanche: No Crepitus: No Cyanosis: Yes Excoriation: No Ecchymosis: No Induration: No Erythema: Yes Rash: No Hemosiderin Staining: No Scarring: No Mottled: No Pallor: No Moisture Rubor: No No Abnormalities Noted: No Dry / Scaly: No Temperature / Pain Maceration: No Temperature: No Abnormality Tenderness on Palpation: Yes Wound Preparation Ulcer Cleansing: Rinsed/Irrigated with Saline Topical Anesthetic Applied: Other: lidocaine 4%, Assessment Notes pt c/o intermittent pain to left great toe and foot. takes tylenol with no relief. enc to see primary PRN. Treatment Notes Wound #7 (Left Toe Great) Notes coverlet Electronic Signature(s) Signed: 06/09/2018 2:18:07 PM By: Secundino Ginger Entered By: Secundino Ginger on 06/09/2018 10:46:06 [name], [name] ([id number]) -------------------------------------------------------------------------------- Wound Assessment Details Patient Name: Shawn Bark T. Date of Service: 06/09/2018 10:30 AM Medical Record Number: [id number] Patient Account Number: [id number] Date of Birth/Sex: Sep 03, 1931 (85 y.o. M) Treating RN: Secundino Ginger Primary Care Slade Pierpoint: Webb Silversmith Other Clinician: Referring Virgia Kelner: Webb Silversmith Treating Miosha Behe/Extender: STONE III, HOYT Weeks in Treatment: 3 Wound Status Wound Number: 8 Primary Arterial Insufficiency Ulcer Etiology: Wound Location: Left Metatarsal head first - Medial Secondary Diabetic Wound/Ulcer of the Lower Extremity Wounding Event: Gradually Appeared Etiology: Date Acquired: 05/03/2018  Wound Open Weeks Of Treatment: 3 Status: Clustered Wound: No Comorbid Chronic Obstructive Pulmonary Disease Pending Amputation On Presentation History: (COPD), Sleep Apnea, Arrhythmia, Congestive Heart Failure, Hypertension, Myocardial Infarction, Type II Diabetes,  Osteoarthritis, Neuropathy, Received Radiation Photos Photo Uploaded By: Secundino Ginger on 06/09/2018 11:20:31 Wound Measurements Length: (cm) 2.5 Width: (cm) 3.2 Depth: (cm) 0.1 Area: (cm) 6.283 Volume: (cm) 0.628 % Reduction in Area: -376.3% % Reduction in Volume: -375.8% Epithelialization: None Tunneling: No Undermining: No Wound Description Full Thickness Without Exposed Support Classification: Structures Wound Margin: Flat and Intact Exudate Large Amount: Exudate Type: Purulent Exudate Color: yellow, brown, green Foul Odor After Cleansing: No Slough/Fibrino No Wound Bed Granulation Amount: None Present (0%) Exposed Structure Necrotic Amount: Large (67-100%) Fascia Exposed: No Necrotic Quality: Eschar Fat Layer (Subcutaneous Tissue) Exposed: Yes Corrie, Nicolaas T. ([id number]) Tendon Exposed: No Muscle Exposed: No Joint Exposed: No Bone Exposed: No Periwound Skin Texture Texture Color No Abnormalities Noted: No No Abnormalities Noted: No Callus: No Atrophie Blanche: No Crepitus: No Cyanosis: Yes Excoriation: No Ecchymosis: No Induration: No Erythema: No Rash: No Hemosiderin Staining: No Scarring: No Mottled: No Pallor: No Moisture Rubor: No No Abnormalities Noted: No Dry / Scaly: Yes Temperature / Pain Maceration: No Temperature: No Abnormality Tenderness on Palpation: Yes Wound Preparation Ulcer Cleansing: Rinsed/Irrigated with Saline Topical Anesthetic Applied: Other: lidocaine 4%, Treatment Notes Wound #8 (Left, Medial Metatarsal head first) Notes ABD pad , kerlix Electronic Signature(s) Signed: 06/09/2018 2:18:07 PM By: Secundino Ginger Entered By: Secundino Ginger on 06/09/2018 10:48:43 Mercadel, [name] ([id number]) -------------------------------------------------------------------------------- Wound Assessment Details Patient Name: Shawn Bark T. Date of Service: 06/09/2018 10:30 AM Medical Record Number: [id number] Patient Account Number:  [id number] Date of Birth/Sex: [birth year] (85 y.o. M) Treating RN: Secundino Ginger Primary Care [place] Gandolfo: Webb Silversmith Other Clinician: Referring Gillian Kluever: Webb Silversmith Treating Adie Vilar/Extender: STONE III, HOYT Weeks in Treatment: 3 Wound Status Wound Number: 9 Primary Arterial Insufficiency Ulcer Etiology: Wound Location: Left Calcaneus - Posterior Wound Open Wounding Event: Gradually Appeared Status: Date Acquired: 05/29/2018 Comorbid Chronic Obstructive Pulmonary Disease (COPD), Weeks Of Treatment: 0 History: Sleep Apnea, Arrhythmia, Congestive Heart Clustered Wound: No Failure, Hypertension, Myocardial Infarction, Type II Diabetes, Osteoarthritis, Neuropathy, Received Radiation Photos Photo Uploaded By: Secundino Ginger on 06/09/2018 11:20:31 Wound Measurements Length: (cm) 1.5 Width: (cm) 0.5 Depth: (cm) 0.1 Area: (cm) 0.589 Volume: (cm) 0.059 % Reduction in Area: 0% % Reduction in Volume: 0% Wound Description Full Thickness Without Exposed Support Foul Classification: Structures Sloug Exudate Small Amount: Exudate Type: Serous Exudate Color: amber Odor After Cleansing: No h/Fibrino Yes Wound Bed Granulation Amount: None Present (0%) Exposed Structure Necrotic Amount: Large (67-100%) Fascia Exposed: No Necrotic Quality: Adherent Slough Fat Layer (Subcutaneous Tissue) Exposed: Yes Tendon Exposed: No Muscle Exposed: No Joint Exposed: No Marcon, Harlan T. ([id number]) Bone Exposed: No Periwound Skin Texture Texture Color No Abnormalities Noted: No No Abnormalities Noted: No Callus: No Atrophie Blanche: No Crepitus: No Cyanosis: No Excoriation: No Ecchymosis: No Induration: No Erythema: No Rash: No Hemosiderin Staining: No Scarring: No Mottled: No Pallor: No Moisture Rubor: No No Abnormalities Noted: No Dry / Scaly: No Maceration: No Wound Preparation Ulcer Cleansing: Rinsed/Irrigated with Saline Topical Anesthetic Applied: Other: lidocaine  4%, Treatment Notes Wound #9 (Left, Posterior Calcaneus) Notes ABD pad , kerlix Electronic Signature(s) Signed: 06/09/2018 2:18:07 PM By: Secundino Ginger Entered By: Secundino Ginger on 06/09/2018 10:54:22 Cozine, [name] ([id number]) -------------------------------------------------------------------------------- Vitals Details Patient Name: Shawn Bark T. Date of Service: 06/09/2018 10:30 AM Medical Record Number: [id number] Patient Account Number:  [id number] Date of Birth/Sex: 1931/06/23 (85 y.o. M) Treating RN: Secundino Ginger Primary Care Bethel Gaglio: Webb Silversmith Other Clinician: Referring Krislyn Donnan: Webb Silversmith Treating Terry Abila/Extender: Melburn Hake, HOYT Weeks in Treatment: 3 Vital Signs Time Taken: 10:31 Temperature (F): 97.8 Height (in): 66 Pulse (bpm): 81 Weight (lbs): 180 Respiratory Rate (breaths/min): 16 Body Mass Index (BMI): 29 Blood Pressure (mmHg): 120/72 Reference Range: 80 - 120 mg / dl Electronic Signature(s) Signed: 06/09/2018 2:18:07 PM By: Secundino Ginger Entered BySecundino Ginger on 06/09/2018 10:32:31

## 2018-06-13 NOTE — Op Note (Signed)
Date of operation: 06/13/2018.  Surgeon: [name] D.P.M.  Preoperative diagnosis: Necrosis with septic joint and or osteomyelitis left foot.  Postoperative diagnosis: Same.  Procedure: Amputation left great toe with partial first ray resection.  Anesthesia: LMA with local.  Hemostasis: Pneumatic tourniquet left ankle 250 mmHg.  Estimated blood loss: 50 cc.  Pathology: Left first ray.  Cultures: Bone cultures left great toe.  Implants: Stimulan rapid cure antibiotic beads impregnated with vancomycin.  Complications: None apparent.  Operative indications: This is an 85 year old male with recent development of a full-thickness wound down to the level of the joint on his left great toe joint.  Was admitted to the hospital and underwent revascularization procedure to improve blood flow on the left foot.  MRI showed evidence for septic joint and possible early osteomyelitis and decision was made for amputation of the great toe with partial first ray resection.  Operative procedure: Patient was taken the operating room and placed on the table in the supine position.  Following satisfactory LMA anesthesia the left forefoot was anesthetized with 10 cc of 0.5% bupivacaine plain around the first metatarsal area.  Pneumatic tourniquet was applied to the level of the left ankle.  The foot was then prepped and draped in the usual sterile fashion.     Attention was directed to the medial aspect of the left foot where an incision was made medially along the first metatarsal and carried just dorsal over the open ulcerative area and then around the base of the great toe both dorsal and plantar.  Dissection was carried proximally with care taken to remove all the soft tissue off of the distal aspect of the first metatarsal and great toe joint.  Using a sagittal saw the first metatarsal was transected and the entire distal first ray removed in toto.  There was noted to be some mild necrosis around the  plantar aspect of the ulceration.  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  All the remaining wound edges were then debrided with a versa jet on a setting of 4 and then thoroughly irrigated with a versa jet on a setting of 2.  The toe amputation site and proximal aspect of the incision were closed using 3-0 nylon simple interrupted sutures.  The wound was then flushed with copious amounts of sterile saline using a bulb syringe.  The Stimulan rapid cure antibiotic beads implanted into the wound and then the remainder of the defect was able to be reapproximated using the 2 plantar flaps around the ulceration and incorporated into the dorsal portion of the incision.  Xeroform 4 x 4's ABDs and Kerlix applied to the left foot.  Tourniquet released.  A second Kerlix and Ace wrap were then applied to the left lower extremity for compression.  Patient tolerated the procedure and anesthesia well and was transported to the PACU with vital signs stable and in good condition.

## 2018-06-13 NOTE — Progress Notes (Signed)
PT Cancellation Note  Patient Details Name: [name]. MRN: [id number] DOB: Feb 03, 1932   Cancelled Treatment:    Reason Eval/Treat Not Completed: Other (comment).  PT consult received.  Chart reviewed.  Per notes pt going for first ray amputation this afternoon.  Per discussion with pt's nurse, will hold PT at this time until after surgery and WB'ing precautions are known (may also need new PT order s/p surgery).  Leitha Bleak, PT 06/13/18, 9:40 AM [phone]

## 2018-06-13 NOTE — Anesthesia Procedure Notes (Signed)
Procedure Name: LMA Insertion Date/Time: 06/13/2018 2:56 PM Performed by: Jonna Clark, CRNA Pre-anesthesia Checklist: Patient identified, Patient being monitored, Timeout performed, Emergency Drugs available and Suction available Patient Re-evaluated:Patient Re-evaluated prior to induction Oxygen Delivery Method: Circle system utilized Preoxygenation: Pre-oxygenation with 100% oxygen Induction Type: IV induction Ventilation: Mask ventilation without difficulty LMA: LMA inserted LMA Size: 4.0 Tube type: Oral Number of attempts: 1 Placement Confirmation: positive ETCO2 and breath sounds checked- equal and bilateral Tube secured with: Tape Dental Injury: Teeth and Oropharynx as per pre-operative assessment

## 2018-06-13 NOTE — Interval H&P Note (Signed)
History and Physical Interval Note:  06/13/2018 2:42 PM  [name].  has presented today for surgery, with the diagnosis of Osteomylitis  The various methods of treatment have been discussed with the patient and family. After consideration of risks, benefits and other options for treatment, the patient has consented to  Procedure(s): 1st Ray Resection Left (Left) as a surgical intervention .  The patient's history has been reviewed, patient examined, no change in status, stable for surgery.  I have reviewed the patient's chart and labs.  Questions were answered to the patient's satisfaction.     [name]

## 2018-06-13 NOTE — Anesthesia Post-op Follow-up Note (Signed)
Anesthesia QCDR form completed.        

## 2018-06-13 NOTE — Progress Notes (Signed)
Pharmacy Electrolyte Monitoring Consult:  Pharmacy consulted to assist in monitoring and replacing electrolytes in this 85 y.o. male admitted on 06/09/2018 with No chief complaint on file.   Labs:  Sodium (mmol/L)  Date Value  06/13/2018 138  02/21/2015 143  08/05/2014 137   Potassium (mmol/L)  Date Value  06/13/2018 4.5  08/05/2014 3.6   Magnesium (mg/dL)  Date Value  06/11/2018 2.5 (H)  04/20/2014 2.1   Phosphorus (mg/dL)  Date Value  05/13/2016 3.6  04/20/2014 3.0   Calcium (mg/dL)  Date Value  06/13/2018 8.8 (L)   Calcium, Total (mg/dL)  Date Value  08/05/2014 8.6 (L)   Albumin (g/dL)  Date Value  06/09/2018 3.3 (L)  05/02/2014 2.4 (L)    Assessment/Plan: Per consult, MD requesting oral replacement, Hx Afib  3/3 Electrolytes WNL.  KCL 80 mEq BID ordered by physician. K+ continues to increase. Will recommended adjusted dose to KCL 17mEq BID.   No replacement needed at this time.   Will recheck labs in AM and continue to replace as needed.   Pernell Dupre, PharmD, BCPS Clinical Pharmacist 06/13/2018 7:13 AM

## 2018-06-13 NOTE — Progress Notes (Signed)
Patient did not receive scheduled aspart or Levemir last night. Blood sugar 218 this am. Md notified. New orders for a one time order of 9 units aspart now. Instead of sliding scale and scheduled insulin due this am.

## 2018-06-13 NOTE — Progress Notes (Signed)
[facility] NAME: [name]    MR#:  [id number]  DATE OF BIRTH:  [birth year]  SUBJECTIVE:   Patient denies any complaints. Son in the room right tibial shin distal redness. No pain. Improving from skin marking. REVIEW OF SYSTEMS:   Review of Systems  Constitutional: Negative for chills, fever and weight loss.  HENT: Negative for ear discharge, ear pain and nosebleeds.   Eyes: Negative for blurred vision, pain and discharge.  Respiratory: Negative for sputum production, shortness of breath, wheezing and stridor.   Cardiovascular: Negative for chest pain, palpitations, orthopnea and PND.  Gastrointestinal: Negative for abdominal pain, diarrhea, nausea and vomiting.  Genitourinary: Negative for frequency and urgency.  Musculoskeletal: Negative for back pain and joint pain.  Neurological: Negative for sensory change, speech change, focal weakness and weakness.  Psychiatric/Behavioral: Negative for depression and hallucinations. The patient is not nervous/anxious.    Tolerating Diet:yes Tolerating PT: pending  DRUG ALLERGIES:   Allergies  Allergen Reactions  . Contrast Media [Iodinated Diagnostic Agents] Shortness Of Breath  . Morphine And Related Other (See Comments)    Hallucinations   . Niacin And Related Dermatitis  . Other     Other reaction(s): SHORTNESS OF BREATH  . Amlodipine Rash    VITALS:  Blood pressure 115/70, pulse 67, temperature (!) 97.3 F (36.3 C), temperature source Axillary, resp. rate 19, height 5\' 6"  (1.676 m), weight 85.3 kg, SpO2 99 %.  PHYSICAL EXAMINATION:   Physical Exam  GENERAL:  85 y.o.-year-old patient lying in the bed with no acute distress.  EYES: Pupils equal, round, reactive to light and accommodation. No scleral icterus. Extraocular muscles intact.  HEENT: Head atraumatic, normocephalic. Oropharynx and nasopharynx clear.  NECK:  Supple, no jugular venous distention. No  thyroid enlargement, no tenderness.  LUNGS: Normal breath sounds bilaterally, no wheezing, rales, rhonchi. No use of accessory muscles of respiration.  CARDIOVASCULAR: S1, S2 normal. No murmurs, rubs, or gallops.  ABDOMEN: Soft, nontender, nondistended. Bowel sounds present. No organomegaly or mass.  EXTREMITIES: No cyanosis, clubbing or edema b/l.   Left foot dressing + right distal tibial shin cellulitis improved from skin marking NEUROLOGIC: Cranial nerves II through XII are intact. No focal Motor or sensory deficits b/l.   PSYCHIATRIC:  patient is alert and oriented x 3.  SKIN: No obvious rash, lesion, or ulcer.   LABORATORY PANEL:  CBC Recent Labs  Lab 06/11/18 0443  WBC 15.4*  HGB 12.4*  HCT 38.6*  PLT 218    Chemistries  Recent Labs  Lab 06/09/18 1302  06/11/18 0443  06/13/18 0322  NA 140   < >  --    < > 138  K 4.1   < > 2.9*   < > 4.5  CL 100   < >  --    < > 105  CO2 29   < >  --    < > 26  GLUCOSE 115*   < >  --    < > 277*  BUN 50*   < >  --    < > 41*  CREATININE 1.62*   < > 1.52*   < > 1.62*  CALCIUM 9.4   < >  --    < > 8.8*  MG  --   --  2.5*  --   --   AST 14*  --   --   --   --   ALT  14  --   --   --   --   ALKPHOS 67  --   --   --   --   BILITOT 0.7  --   --   --   --    < > = values in this interval not displayed.   Cardiac Enzymes No results for input(s): TROPONINI in the last 168 hours. RADIOLOGY:  No results found. ASSESSMENT AND PLAN:  [name]  is a 85 y.o. male with a known history of CAD, A. fib, hypertension, diabetes, chronic diastolic CHF, CVA, COPD with oxygen at night, hyperlipidemia, neuropathy and foot ulcer, etc. The patient presents the ED with worsening left foot infection.  The patient has been following up with wound care for left foot ulcer, which has been worsening for the past few days.  *Left foot diabetic ulcer infection with leukocytosis, ruled out osteomyelitis with negative MRI -Continue IV vancomycin and Zosyn.  De-escalate antibiotics  -wound culture shows few gram-negative rods --MRI of left foot Soft tissue wound along the medial aspect of the first MTP joint. Small joint effusion of the first MTP joint with mild marrow edema at the base of the first proximal phalanx and head of the first metatarsal adjacent to the joint and soft tissue wound. No definite cortical destruction. -Status post excision and debridement at bedside by podiatry [place] -plans for first Ray resection by podiatry plan for today, March 3  *Right tibial shin cellulitis improving from skin marking area. Patient already on broad-spectrum antibiotics. He has significant dry skin in the foot likely the source for cellulitis  *Peripheral vascular disease  -vascular consultappreciated. -s/p left LE angiogram with angioplasty of left ant tibial artery, left popliteal artery and stent placement of left SFA  *Diabetes.  Continue Levemir, insulin AC and start sliding scale. Sugars low in am--insulin changes made  *Diabetes neuropathy.  Continue current regimen.  *CKD.  Stage IV.  Stable.  *History of A. fib.  Continue Lopressor.  Hold Eliquis  With pending procedure per Dr. Cleda Mccreedy.  Management plans discussed with the patient  CODE STATUS: Full code  DVT Prophylaxis: will resume eliquis after surgery today  TOTAL TIME TAKING CARE OF THIS PATIENT: 30 minutes.  >50% time spent on counselling and coordination of care  POSSIBLE D/C IN *2 to 3* DAYS, DEPENDING ON CLINICAL CONDITION.  Note: This dictation was prepared with Dragon dictation along with smaller phrase technology. Any transcriptional errors that result from this process are unintentional.  Fritzi Mandes M.D on 06/13/2018 at 7:33 AM  Between 7am to 6pm - Pager - [phone]  After 6pm go to [email or web address] - password [email or web address] [facility]  Office  [phone]  CC: Primary care physician; Jearld Fenton, NPPatient ID: Shawn Grace.,  male   DOB: 1932/03/02, 85 y.o.   MRN: [id number]

## 2018-06-14 ENCOUNTER — Encounter: Payer: Self-pay | Admitting: Podiatry

## 2018-06-14 DIAGNOSIS — B9620 Unspecified Escherichia coli [E. coli] as the cause of diseases classified elsewhere: Secondary | ICD-10-CM

## 2018-06-14 DIAGNOSIS — L089 Local infection of the skin and subcutaneous tissue, unspecified: Secondary | ICD-10-CM

## 2018-06-14 DIAGNOSIS — Z89412 Acquired absence of left great toe: Secondary | ICD-10-CM

## 2018-06-14 DIAGNOSIS — B965 Pseudomonas (aeruginosa) (mallei) (pseudomallei) as the cause of diseases classified elsewhere: Secondary | ICD-10-CM

## 2018-06-14 LAB — GLUCOSE, CAPILLARY
GLUCOSE-CAPILLARY: 272 mg/dL — AB (ref 70–99)
Glucose-Capillary: 98 mg/dL (ref 70–99)
Glucose-Capillary: 223 mg/dL — ABNORMAL HIGH (ref 70–99)
Glucose-Capillary: 198 mg/dL — ABNORMAL HIGH (ref 70–99)

## 2018-06-14 LAB — CULTURE, BLOOD (ROUTINE X 2)
Culture: NO GROWTH
Culture: NO GROWTH

## 2018-06-14 LAB — POTASSIUM: Potassium: 4.5 mmol/L (ref 3.5–5.1)

## 2018-06-14 MED ORDER — APIXABAN 2.5 MG PO TABS: 2.5000 mg | ORAL_TABLET | Freq: Two times a day (BID) | ORAL | Status: DC

## 2018-06-14 MED ORDER — CIPROFLOXACIN HCL 500 MG PO TABS
500.0000 mg | ORAL_TABLET | Freq: Two times a day (BID) | ORAL | Status: DC
  Administered 2018-06-15 – 2018-06-16 (×3): 500 mg via ORAL
  Filled 2018-06-14 (×4): qty 1

## 2018-06-14 MED ORDER — METRONIDAZOLE 500 MG PO TABS
500.0000 mg | ORAL_TABLET | Freq: Two times a day (BID) | ORAL | Status: DC
  Administered 2018-06-15 – 2018-06-16 (×3): 500 mg via ORAL
  Filled 2018-06-14 (×3): qty 1

## 2018-06-14 NOTE — Progress Notes (Addendum)
Pharmacy Electrolyte Monitoring Consult:  Pharmacy consulted to assist in monitoring and replacing electrolytes in this 85 y.o. male admitted on 06/09/2018 with No chief complaint on file.   Labs:  Sodium (mmol/L)  Date Value  06/13/2018 138  02/21/2015 143  08/05/2014 137   Potassium (mmol/L)  Date Value  06/14/2018 4.5  08/05/2014 3.6   Magnesium (mg/dL)  Date Value  06/11/2018 2.5 (H)  04/20/2014 2.1   Phosphorus (mg/dL)  Date Value  05/13/2016 3.6  04/20/2014 3.0   Calcium (mg/dL)  Date Value  06/13/2018 8.8 (L)   Calcium, Total (mg/dL)  Date Value  08/05/2014 8.6 (L)   Albumin (g/dL)  Date Value  06/09/2018 3.3 (L)  05/02/2014 2.4 (L)    Assessment/Plan: Per consult, MD requesting oral replacement, Hx Afib  3/4 Electrolytes WNL.  KCL 80 mEq BID (reported home dose) ordered by physician. Will continue to monitor K+ while on high dose KCL supplementation for a few days.  No replacement needed at this time.   Will recheck labs in AM and continue to replace as needed.   Pernell Dupre, PharmD, BCPS Clinical Pharmacist 06/14/2018 8:28 AM

## 2018-06-14 NOTE — Clinical Social Work Note (Signed)
Clinical Social Work Assessment  Patient Details  Name: [name]. MRN: [id number] Date of Birth: 05/31/1931  Date of referral:  06/14/18               Reason for consult:  Facility Placement                Permission sought to share information with:  Facility Sport and exercise psychologist, Family Supports Permission granted to share information::  Yes, Verbal Permission Granted  Name::     Rahmir, Beever [phone]  or Curley, Hogen [phone]  [phone] or [name] Daughter [phone]   Agency::  SNF admissions  Relationship::     Contact Information:     Housing/Transportation Living arrangements for the past 2 months:  Single Family Home Source of Information:  Patient, Adult Children Patient Interpreter Needed:  None Criminal Activity/Legal Involvement Pertinent to Current Situation/Hospitalization:  No - Comment as needed Significant Relationships:  Adult Children, Spouse Lives with:  Relatives, Spouse Do you feel safe going back to the place where you live?  No Need for family participation in patient care:  Yes (Comment)  Care giving concerns: Patient feels he needs rehab before going back home.   Social Worker assessment / plan:  Patient is an 85 year old male who is alert and oriented x3.  Patient states he has been at SNF in the past and had a bad experience, but he is willing to try a different one.  Patient was explained role of CSW and process for going to SNF for short term rehab.  CSW explained how insurance will pay for stay and what to expect.  Patient lives with his wife, who has Parkinson's, and he helps take care of her.  Patient has a niece that also lives with them and helps take care of both patient and his wife.  Patient originally said he wants to go home with home health, CSW explained to patient the pros and cons of going to home with home health verse going to SNF.  Patient agreed to go to SNF, patient is requesting [employer], CSW was given permission to begin bed search in [place].  Patient and his family did not have any other questions or concerns.  Employment status:  Retired Nurse, adult PT Recommendations:  [place] / Referral to community resources:  [place]  Patient/Family's Response to care:  Patient and family agreeable to going to SNF for short term rehab.  Patient/Family's Understanding of and Emotional Response to Diagnosis, Current Treatment, and Prognosis:  Patient is hopeful that he will not have to be at SNF for very long.  Emotional Assessment Appearance:  Appears stated age Attitude/Demeanor/Rapport:    Affect (typically observed):  Appropriate, Stable Orientation:  Oriented to Self, Oriented to Place, Oriented to  Time Alcohol / Substance use:  Not Applicable Psych involvement (Current and /or in the community):  No (Comment)  Discharge Needs  Concerns to be addressed:  Care Coordination, Lack of Support Readmission within the last 30 days:  No Current discharge risk:  Lack of support system Barriers to Discharge:  Continued Medical Work up, [employer]   Ross Ludwig, LCSW 06/14/2018, 7:25 PM

## 2018-06-14 NOTE — NC FL2 (Signed)
[place] LEVEL OF CARE SCREENING TOOL     IDENTIFICATION  Patient Name: [name]. Birthdate: [birth year] Sex: male Admission Date (Current Location): 06/09/2018  [place] and [place] Number:  Engineering geologist and Address:  [facility] [address], [place], [place] [address]      Provider Number: [id number]  Attending Physician Name and Address:  Fritzi Mandes, MD  Relative Name and Phone Number:  Sarvesh, Meddaugh [phone]  or Chey, Cho [phone]  [phone] or [name] Daughter [phone]     Current Level of Care: Hospital Recommended Level of Care: [place] Prior Approval Number:    Date Approved/Denied:   PASRR Number: [id number]  Discharge Plan: SNF    Current Diagnoses: Patient Active Problem List   Diagnosis Date Noted  . Diabetic infection of left foot ([place]) 06/09/2018  . Rash and nonspecific skin eruption 11/18/2017  . Acquired hypothyroidism 11/22/2016  . Osteoarthritis, knee 11/22/2016  . Diabetes type 2, controlled ([place]) 06/13/2015  . BPH (benign prostatic hyperplasia) 05/14/2014  . Anxiety 05/14/2014  . Chronic obstructive pulmonary disease ([place]) 05/14/2014  . Chronic diastolic CHF (congestive heart failure) ([place])   . Insomnia 08/07/2013  . Hyperlipidemia 02/26/2009  . MITRAL REGURGITATION 02/26/2009  . Essential hypertension 02/26/2009  . MI 02/26/2009  . Coronary artery disease of native artery of native heart with stable angina pectoris ([place]) 02/26/2009  . Chronic atrial fibrillation 02/26/2009  . CVA (cerebral vascular accident) ([place]) 02/26/2009    Orientation RESPIRATION BLADDER Height & Weight     Self, Time, Situation, Place  Normal Continent Weight: 188 lb 0.8 oz (85.3 kg) Height:  5\' 6"  (167.6 cm)  BEHAVIORAL SYMPTOMS/MOOD NEUROLOGICAL BOWEL NUTRITION STATUS      Continent Diet(Carb modified)  AMBULATORY STATUS COMMUNICATION OF NEEDS Skin    Limited Assist Verbally Surgical wounds, Normal                       Personal Care Assistance Level of Assistance  Feeding, Dressing, Bathing Bathing Assistance: Limited assistance Feeding assistance: Independent Dressing Assistance: Limited assistance     Functional Limitations Info  Sight, Hearing, Speech Sight Info: Adequate Hearing Info: Adequate Speech Info: Adequate    SPECIAL CARE FACTORS FREQUENCY  PT (By licensed PT), OT (By licensed OT)     PT Frequency: 5x week OT Frequency: 5x week            Contractures Contractures Info: Not present    Additional Factors Info  Code Status, Allergies, Psychotropic, Insulin Sliding Scale Code Status Info: Full Code Allergies Info: CONTRAST MEDIA IODINATED DIAGNOSTIC AGENTS, MORPHINE AND RELATED, NIACIN AND RELATED, OTHER, AMLODIPINE Psychotropic Info: citalopram (CELEXA) tablet 10 mg  Insulin Sliding Scale Info: insulin aspart (novoLOG) injection 0-5 Units        Current Medications (06/14/2018):  This is the current hospital active medication list Current Facility-Administered Medications  Medication Dose Route Frequency Provider Last Rate Last Dose  . acetaminophen (TYLENOL) tablet 650 mg  650 mg Oral Q6H PRN Sharlotte Alamo, DPM   650 mg at 06/11/18 1554   Or  . acetaminophen (TYLENOL) suppository 650 mg  650 mg Rectal Q6H PRN Sharlotte Alamo, DPM      . albuterol (PROVENTIL) (2.5 MG/3ML) 0.083% nebulizer solution 2.5 mg  2.5 mg Nebulization Q2H PRN Sharlotte Alamo, DPM      . ALPRAZolam Duanne Moron) tablet 0.25 mg  0.25 mg Oral QHS PRN Sharlotte Alamo, DPM      .  apixaban (ELIQUIS) tablet 2.5 mg  2.5 mg Oral BID Sharlotte Alamo, DPM   2.5 mg at 06/14/18 0906  . aspirin EC tablet 81 mg  81 mg Oral Daily Sharlotte Alamo, DPM   81 mg at 06/14/18 4580  . atorvastatin (LIPITOR) tablet 40 mg  40 mg Oral Daily Sharlotte Alamo, DPM   40 mg at 06/14/18 9983  . bisacodyl (DULCOLAX) EC tablet 5 mg  5 mg Oral Daily PRN Sharlotte Alamo, DPM   5 mg at 06/11/18  1422  . citalopram (CELEXA) tablet 10 mg  10 mg Oral Daily Sharlotte Alamo, DPM   10 mg at 06/14/18 3825  . colchicine tablet 0.6 mg  0.6 mg Oral BID PRN Sharlotte Alamo, DPM   0.6 mg at 06/11/18 1422  . dorzolamide (TRUSOPT) 2 % ophthalmic solution 1 drop  1 drop Both Eyes TID Sharlotte Alamo, DPM   1 drop at 06/14/18 1522  . ezetimibe (ZETIA) tablet 10 mg  10 mg Oral Daily Sharlotte Alamo, DPM   10 mg at 06/14/18 0539  . finasteride (PROSCAR) tablet 5 mg  5 mg Oral Daily Sharlotte Alamo, DPM   5 mg at 06/14/18 7673  . gabapentin (NEURONTIN) capsule 100 mg  100 mg Oral TID Sharlotte Alamo, DPM   100 mg at 06/14/18 1521  . HYDROcodone-acetaminophen (NORCO/VICODIN) 5-325 MG per tablet 1 tablet  1 tablet Oral Q4H PRN Sharlotte Alamo, DPM   1 tablet at 06/13/18 2335  . insulin aspart (novoLOG) injection 0-5 Units  0-5 Units Subcutaneous QHS Sharlotte Alamo, DPM   2 Units at 06/13/18 2131  . insulin aspart (novoLOG) injection 0-9 Units  0-9 Units Subcutaneous TID WC Sharlotte Alamo, DPM   3 Units at 06/14/18 1249  . insulin aspart (novoLOG) injection 4 Units  4 Units Subcutaneous TID WC Sharlotte Alamo, DPM   4 Units at 06/14/18 1249  . insulin detemir (LEVEMIR) injection 32 Units  32 Units Subcutaneous Q2200 Sharlotte Alamo, DPM   32 Units at 06/13/18 2131  . latanoprost (XALATAN) 0.005 % ophthalmic solution 1 drop  1 drop Both Eyes QHS Sharlotte Alamo, DPM   1 drop at 06/13/18 2133  . levothyroxine (SYNTHROID, LEVOTHROID) tablet 50 mcg  50 mcg Oral QAC breakfast Sharlotte Alamo, DPM   50 mcg at 06/14/18 0533  . metolazone (ZAROXOLYN) tablet 5 mg  5 mg Oral Daily PRN Sharlotte Alamo, DPM      . metoprolol succinate (TOPROL-XL) 24 hr tablet 50 mg  50 mg Oral BID Sharlotte Alamo, DPM   50 mg at 06/14/18 0910  . mometasone-formoterol (DULERA) 200-5 MCG/ACT inhaler 2 puff  2 puff Inhalation BID Sharlotte Alamo, DPM   2 puff at 06/14/18 0904  . montelukast (SINGULAIR) tablet 10 mg  10 mg Oral QHS Sharlotte Alamo, DPM   10 mg at 06/13/18 2128  . mupirocin ointment  (BACTROBAN) 2 % 1 application  1 application Nasal BID Sharlotte Alamo, DPM   1 application at 41/93/79 2137  . nitroGLYCERIN (NITROSTAT) SL tablet 0.4 mg  0.4 mg Sublingual Q5 min PRN Sharlotte Alamo, DPM      . ondansetron [facility]) tablet 4 mg  4 mg Oral Q6H PRN Sharlotte Alamo, DPM       Or  . ondansetron [facility]) injection 4 mg  4 mg Intravenous Q6H PRN Sharlotte Alamo, DPM      . piperacillin-tazobactam (ZOSYN) IVPB 3.375 g  3.375 g Intravenous Q8H Sharlotte Alamo, DPM 12.5 mL/hr at 06/14/18 1558    .  potassium chloride SA (K-DUR,KLOR-CON) CR tablet 80 mEq  80 mEq Oral BID Sharlotte Alamo, DPM   80 mEq at 06/14/18 3112  . predniSONE (DELTASONE) tablet 10 mg  10 mg Oral Q breakfast Sharlotte Alamo, DPM   10 mg at 06/14/18 1624  . senna-docusate (Senokot-S) tablet 1 tablet  1 tablet Oral QHS PRN Sharlotte Alamo, DPM      . sodium chloride flush (NS) 0.9 % injection 3 mL  3 mL Intravenous Once Sharlotte Alamo, DPM      . tamsulosin (FLOMAX) capsule 0.4 mg  0.4 mg Oral Daily Sharlotte Alamo, DPM   0.4 mg at 06/14/18 0906  . torsemide (DEMADEX) tablet 40 mg  40 mg Oral BID Sharlotte Alamo, DPM   40 mg at 06/14/18 1248  . traMADol (ULTRAM) tablet 50 mg  50 mg Oral BID Sharlotte Alamo, DPM   50 mg at 06/14/18 0909  . traZODone (DESYREL) tablet 100 mg  100 mg Oral QHS PRN Sharlotte Alamo, DPM   100 mg at 06/09/18 2052  . umeclidinium bromide (INCRUSE ELLIPTA) 62.5 MCG/INH 1 puff  1 puff Inhalation Daily Sharlotte Alamo, DPM   1 puff at 06/14/18 4695     Discharge Medications: Please see discharge summary for a list of discharge medications.  Relevant Imaging Results:  Relevant Lab Results:   Additional Information SSN [id number]  Ross Ludwig, LCSW

## 2018-06-14 NOTE — Evaluation (Signed)
Physical Therapy Evaluation Patient Details Name: [name]. MRN: [id number] DOB: [birth year] Today's Date: 06/14/2018   History of Present Illness  Pt is an 85 y.o. male presenting to hospital 06/09/18 with worsening wound L LE.  Pt admitted with L diabetic foot ulcer infection with leukocytosis and R tibial shin cellulitis.  Pt s/p L LE angio with stent placement L SFA.  Pt also s/p amputation L great toe with partial 1st ray resection 06/13/18.  PT continue at transfer order received post-op.  PMH includes CAD, a-fib, CHF, COPD with O2 at night, DM, htn, neuropathy.  Clinical Impression  Prior to hospital admission, pt was modified independent ambulating with rollator (h/o balance issues).  Pt lives with his wife and niece in 1 level home with 2 steps (B railings) to enter.  Currently pt is SBA to min assist with bed mobility; max assist to stand with RW from elevated bed (pt able to maintain L LE NWB'ing status with cueing); and (for ambulation trial) pt unable to maintain L LE NWB'ing status x1 trial with RW and max cueing and 1 assist so deferred further mobility for safety.  1-2/10 L foot pain at rest beginning of session; 0/10 L foot pain at rest end of session.  Pt reporting h/o stroke with L sided weakness (L UE and L LE weaker compared to R side) complicating mobility as well as generalized weakness from recent decreased activity during hospitalization.  Pt would benefit from skilled PT to address noted impairments and functional limitations (see below for any additional details).  Upon hospital discharge, recommend pt discharge to [place].    Follow Up Recommendations SNF    Equipment Recommendations  Rolling walker with 5" wheels;Wheelchair (measurements PT);Wheelchair cushion (measurements PT);3in1 (PT)    Recommendations for Other Services       Precautions / Restrictions Precautions Precautions: Fall Precaution Comments: L LE orthowedge shoe Restrictions Weight Bearing  Restrictions: Yes LLE Weight Bearing: Non weight bearing Other Position/Activity Restrictions: Per secure message to Dr. Cleda Mccreedy 06/14/18 MD reporting pt currently [place] L LE (monitor for possible upgrade in [place] in 1-2 days)      Mobility  Bed Mobility Overal bed mobility: Needs Assistance Bed Mobility: Supine to Sit;Sit to Supine     Supine to sit: Supervision;HOB elevated Sit to supine: Min assist;HOB elevated   General bed mobility comments: SBA for safety semi-supine to sit; min assist for L LE sit to semi-supine; 2 assist to boost pt up in bed  Transfers Overall transfer level: Needs assistance Equipment used: Rolling walker (2 wheeled) Transfers: Sit to/from Stand Sit to Stand: Max assist;From elevated surface         General transfer comment: pt unable to stand from regular height bed with 1 assist; able to stand from significantly elevated bed with max assist x1, vc's for UE/LE positioning and overall technique  Ambulation/Gait Ambulation/Gait assistance: (1 assist) Gait Distance (Feet): 0 Feet Assistive device: Rolling walker (2 wheeled)       General Gait Details: pt instructed in [place] status and use of RW/gait technique but pt unable to maintain L LE NWB'ing status x1 trial (and pt appearing too weak to safely attempt again) so deferred further attempts  Stairs            Wheelchair Mobility    Modified Rankin (Stroke Patients Only)       Balance Overall balance assessment: Needs assistance Sitting-balance support: No upper extremity supported;Feet unsupported Sitting balance-Leahy Scale: Good Sitting balance -  Comments: steady sitting reaching within BOS   Standing balance support: Bilateral upper extremity supported Standing balance-Leahy Scale: Poor Standing balance comment: pt requiring B UE support on RW for static standing balance (with L LE NWB'ing)                             Pertinent Vitals/Pain Pain Assessment:  0-10 Pain Score: 0-No pain Pain Location: L foot Pain Intervention(s): Limited activity within patient's tolerance;Monitored during session;Repositioned;Patient requesting pain meds-RN notified;RN gave pain meds during session;Other (comment)(L LE elevated)  Vitals (HR and O2 on room air) stable and WFL throughout treatment session.    Home Living Family/patient expects to be discharged to:: Private residence Living Arrangements: Spouse/significant other;Other relatives(Niece) Available Help at Discharge: Family Type of Home: House Home Access: Stairs to enter Entrance Stairs-Rails: Right;Left;Can reach both Entrance Stairs-Number of Steps: 2 Home Layout: One level Home Equipment: [place] - 2 wheels;Walker - 4 wheels;Grab bars - toilet;Bedside commode      Prior Function Level of Independence: Independent with assistive device(s)         Comments: Modified independent with ambulation using rollator.  H/o 1 fall (pt reports impaired balance for years).     Hand Dominance        Extremity/Trunk Assessment   Upper Extremity Assessment Upper Extremity Assessment: Generalized weakness;LUE deficits/detail LUE Deficits / Details: very minimal AROM L shoulder flexion (AAROM to grossly 90 degrees); fair L hand grip strength; at least 3/5 elbow flexion/extension    Lower Extremity Assessment Lower Extremity Assessment: RLE deficits/detail;LLE deficits/detail RLE Deficits / Details: at least 4-/5 hip flexion; at least 3/5 AROM knee flexion/extension; at least 3/5 DF/PF AROM LLE Deficits / Details: L ankle deferred; hip flexion 3-/5; knee flexion/extension 3-/5    Cervical / Trunk Assessment Cervical / Trunk Assessment: Normal  Communication   Communication: No difficulties  Cognition Arousal/Alertness: Awake/alert Behavior During Therapy: WFL for tasks assessed/performed Overall Cognitive Status: Within Functional Limits for tasks assessed                                         General Comments General comments (skin integrity, edema, etc.): Redness noted R shin and distal L shin (area L LE that wasn't covered in dressing); no drainage noted through L LE dressings beginning/end of session.  Nursing cleared pt for participation in physical therapy.  Pt agreeable to PT session.  Pt's daughter present beginning of session but left prior to end of session.    Exercises  Transfer training   Assessment/Plan    PT Assessment Patient needs continued PT services  PT Problem List Decreased strength;Decreased activity tolerance;Decreased balance;Decreased mobility;Decreased knowledge of use of DME;Decreased knowledge of precautions;Pain;Decreased skin integrity       PT Treatment Interventions DME instruction;Gait training;Stair training;Functional mobility training;Therapeutic activities;Therapeutic exercise;Balance training;Patient/family education    PT Goals (Current goals can be found in the Care Plan section)  Acute Rehab PT Goals Patient Stated Goal: to improve mobility PT Goal Formulation: With patient Time For Goal Achievement: 06/28/18 Potential to Achieve Goals: Good    Frequency 7X/week   Barriers to discharge Decreased caregiver support      Co-evaluation               AM-PAC PT "6 Clicks" Mobility  Outcome Measure Help needed turning from your back to your side  while in a flat bed without using bedrails?: A Little Help needed moving from lying on your back to sitting on the side of a flat bed without using bedrails?: A Little Help needed moving to and from a bed to a chair (including a wheelchair)?: Total Help needed standing up from a chair using your arms (e.g., wheelchair or bedside chair)?: Total Help needed to walk in hospital room?: Total Help needed climbing 3-5 steps with a railing? : Total 6 Click Score: 10    End of Session Equipment Utilized During Treatment: Gait belt;Other (comment)(R post op shoe; L  orthowedge shoe) Activity Tolerance: Patient tolerated treatment well Patient left: in bed;with call bell/phone within reach;with bed alarm set;with family/visitor present;with SCD's reapplied;Other (comment)(L LE elevated on 2 pillows) Nurse Communication: Mobility status;Precautions;Weight bearing status PT Visit Diagnosis: Other abnormalities of gait and mobility (R26.89);Muscle weakness (generalized) (M62.81);History of falling (Z91.81);Difficulty in walking, not elsewhere classified (R26.2);Pain Pain - Right/Left: Left Pain - part of body: Ankle and joints of foot    Time: [phone] (system wide drill so ended session and returned later))-1031 PT Time Calculation (min) (ACUTE ONLY): 25 min   Charges:   PT Evaluation $PT Eval Low Complexity: 1 Low PT Treatments $Therapeutic Activity: 8-22 mins       Leitha Bleak, PT 06/14/18, 11:15 AM [phone]

## 2018-06-14 NOTE — Progress Notes (Signed)
   Date of Admission:  06/09/2018   Today 06/14/18     ID: [name]. is a 85 y.o. male  Active Problems:   Diabetic infection of left foot ([place])    Subjective: Had 1st ray amputation yesterday Doing well   Medications:  . apixaban  2.5 mg Oral BID  . aspirin EC  81 mg Oral Daily  . atorvastatin  40 mg Oral Daily  . citalopram  10 mg Oral Daily  . dorzolamide  1 drop Both Eyes TID  . ezetimibe  10 mg Oral Daily  . finasteride  5 mg Oral Daily  . gabapentin  100 mg Oral TID  . insulin aspart  0-5 Units Subcutaneous QHS  . insulin aspart  0-9 Units Subcutaneous TID WC  . insulin aspart  4 Units Subcutaneous TID WC  . insulin detemir  32 Units Subcutaneous Q2200  . latanoprost  1 drop Both Eyes QHS  . levothyroxine  50 mcg Oral QAC breakfast  . metoprolol succinate  50 mg Oral BID  . mometasone-formoterol  2 puff Inhalation BID  . montelukast  10 mg Oral QHS  . mupirocin ointment  1 application Nasal BID  . potassium chloride  80 mEq Oral BID  . predniSONE  10 mg Oral Q breakfast  . sodium chloride flush  3 mL Intravenous Once  . tamsulosin  0.4 mg Oral Daily  . torsemide  40 mg Oral BID  . traMADol  50 mg Oral BID  . umeclidinium bromide  1 puff Inhalation Daily    Objective: Vital signs in last 24 hours: Temp:  [97.1 F (36.2 C)-98.7 F (37.1 C)] 97.6 F (36.4 C) (03/04 0815) Pulse Rate:  [57-89] 73 (03/04 1226) Resp:  [15-19] 18 (03/04 1226) BP: (107-124)/(60-74) 123/70 (03/04 1226) SpO2:  [94 %-100 %] 97 % (03/04 1226) Weight:  [85.3 kg] 85.3 kg (03/03 1425)  PHYSICAL EXAM:  General: Alert, cooperative, no distress, appears stated age.  Some bruising around the eyes possibly from tape Left foot- amputation site sutures are well coapted- no bruising, erythema or necrosis    Lab Results Recent Labs    06/12/18 0307 06/13/18 0322 06/14/18 0739  NA 136 138  --   K 3.9 4.5 4.5  CL 99 105  --   CO2 25 26  --   BUN 34* 41*  --   CREATININE  1.42* 1.62*  --    Liver Panel No results for input(s): PROT, ALBUMIN, AST, ALT, ALKPHOS, BILITOT, BILIDIR, IBILI in the last 72 hours. Sedimentation Rate No results for input(s): ESRSEDRATE in the last 72 hours. C-Reactive Protein No results for input(s): CRP in the last 72 hours.  Microbiology: Bone culture neg so far Pathology pending Studies/Results: No results found.   Assessment/Plan: Left great toe ischemic/necrotic wound s/p ray excision-  As per Dr.Cline this is a therapeutic amp. Surgical site looks good superficial culture was pseudomonas and Ecoli He can go on Cipro 500mg  PO Bid and flagyl 500mg  PO BID for 7 -10 days  Pt seen with Dr.Cline Discussed the management with the patient

## 2018-06-14 NOTE — Progress Notes (Signed)
1 Day Post-Op   Subjective/Chief Complaint: Patient seen.  Did have some pain in the foot overnight.  Doing better now.   Objective: Vital signs in last 24 hours: Temp:  [97.1 F (36.2 C)-98.7 F (37.1 C)] 97.6 F (36.4 C) (03/04 0815) Pulse Rate:  [57-89] 63 (03/04 0815) Resp:  [14-19] 18 (03/04 0815) BP: (107-127)/(60-77) 117/74 (03/04 0815) SpO2:  [94 %-100 %] 96 % (03/04 0815) Weight:  [85.3 kg] 85.3 kg (03/03 1425) Last BM Date: 06/12/18  Intake/Output from previous day: 03/03 0701 - 03/04 0700 In: 847.7 [P.O.:150; I.V.:697.7] Out: 1275 [Urine:1275] Intake/Output this shift: Total I/O In: 480 [P.O.:480] Out: -   Bandage is dry and intact.  Some moderate bleeding on the bandaging with minimal drainage and no signs of any purulence.  Erythema and edema significantly improved.  Incisions are well coapted.  Lab Results:  No results for input(s): WBC, HGB, HCT, PLT in the last 72 hours. BMET Recent Labs    06/12/18 0307 06/13/18 0322 06/14/18 0739  NA 136 138  --   K 3.9 4.5 4.5  CL 99 105  --   CO2 25 26  --   GLUCOSE 365* 277*  --   BUN 34* 41*  --   CREATININE 1.42* 1.62*  --   CALCIUM 8.9 8.8*  --    PT/INR No results for input(s): LABPROT, INR in the last 72 hours. ABG No results for input(s): PHART, HCO3 in the last 72 hours.  Invalid input(s): PCO2, PO2  Studies/Results: No results found.  Anti-infectives: Anti-infectives (From admission, onward)   Start     Dose/Rate Route Frequency Ordered Stop   06/13/18 1900  oseltamivir (TAMIFLU) capsule 30 mg  Status:  Discontinued     30 mg Oral 2 times daily 06/13/18 1845 06/13/18 1848   06/13/18 1534  vancomycin (VANCOCIN) powder  Status:  Discontinued       As needed 06/13/18 1534 06/13/18 1556   06/11/18 2000  vancomycin (VANCOCIN) 1,750 mg in sodium chloride 0.9 % 500 mL IVPB  Status:  Discontinued     1,750 mg 250 mL/hr over 120 Minutes Intravenous Every 48 hours 06/09/18 1841 06/11/18 1608   06/11/18 1700  vancomycin (VANCOCIN) IVPB 1000 mg/200 mL premix  Status:  Discontinued     1,000 mg 200 mL/hr over 60 Minutes Intravenous Every 36 hours 06/11/18 1608 06/13/18 0833   06/09/18 2200  piperacillin-tazobactam (ZOSYN) IVPB 3.375 g  Status:  Discontinued     3.375 g 12.5 mL/hr over 240 Minutes Intravenous Every 12 hours 06/09/18 1819 06/09/18 2016   06/09/18 2200  piperacillin-tazobactam (ZOSYN) IVPB 3.375 g     3.375 g 12.5 mL/hr over 240 Minutes Intravenous Every 8 hours 06/09/18 2016     06/09/18 1845  vancomycin (VANCOCIN) IVPB 1000 mg/200 mL premix  Status:  Discontinued     1,000 mg 200 mL/hr over 60 Minutes Intravenous  Once 06/09/18 1832 06/11/18 0818   06/09/18 1615  vancomycin (VANCOCIN) IVPB 1000 mg/200 mL premix     1,000 mg 200 mL/hr over 60 Minutes Intravenous  Once 06/09/18 1612 06/09/18 1838      Assessment/Plan: s/p Procedure(s): 1st Ray Resection Left (Left) Assessment: Stable status post ray resection left foot.   Plan: Sterile dressing reapplied to the left foot.  At this point I think the patient should be stable for discharge or transfer in the next day or so.  Begin physical therapy for touchdown weightbearing on the left heel only  tomorrow.  I think the patient would benefit from at least a couple of weeks of skilled care for management and therapy to keep him off of his foot.  He will need a dressing change at this point probably every other day then down to twice a week.  Recommend follow-up outpatient in 1 week for reevaluation.  LOS: 5 days    [name] 06/14/2018

## 2018-06-14 NOTE — Consult Note (Signed)
Pharmacy Antibiotic Note  [name]. is a 85 y.o. male admitted on 06/09/2018 with wound infection.  Pharmacy has been consulted for Zosyn dosing.  Plan: Continue Zosyn 3.375g q 8h (extended 4-hour infusion)   Height: 5\' 6"  (167.6 cm) Weight: 188 lb 0.8 oz (85.3 kg) IBW/kg (Calculated) : 63.8  Temp (24hrs), Avg:97.6 F (36.4 C), Min:97.1 F (36.2 C), Max:98.7 F (37.1 C)  Recent Labs  Lab 06/09/18 1302 06/09/18 1729 06/10/18 0647 06/11/18 0443 06/12/18 0307 06/13/18 0322  WBC 20.4*  --  20.5* 15.4*  --   --   CREATININE 1.62*  --  1.29* 1.52* 1.42* 1.62*  LATICACIDVEN 2.1* 2.8*  --   --   --   --     Estimated Creatinine Clearance: 34.8 mL/min (A) (by C-G formula based on SCr of 1.62 mg/dL (H)).    Allergies  Allergen Reactions  . Contrast Media [Iodinated Diagnostic Agents] Shortness Of Breath  . Morphine And Related Other (See Comments)    Hallucinations   . Niacin And Related Dermatitis  . Other     Other reaction(s): SHORTNESS OF BREATH  . Amlodipine Rash    Antimicrobials this admission: Vancomycin 2/28 >> 3/3 Zosyn 2/28 >>   Microbiology results: 2/28 BCx: NG 2/28 Toe WCx: E. Coli, Pseudomonas   Thank you for allowing pharmacy to be a part of this patient's care.  Pernell Dupre, PharmD, BCPS Clinical Pharmacist 06/14/2018 1:43 PM

## 2018-06-14 NOTE — Progress Notes (Signed)
[facility] NAME: [name]    MR#:  [id number]  DATE OF BIRTH:  March 18, 1932  SUBJECTIVE:   Patient denies any complaints.  right tibial shin distal redness improiving No pain.s/p surgery POD #1 REVIEW OF SYSTEMS:   Review of Systems  Constitutional: Negative for chills, fever and weight loss.  HENT: Negative for ear discharge, ear pain and nosebleeds.   Eyes: Negative for blurred vision, pain and discharge.  Respiratory: Negative for sputum production, shortness of breath, wheezing and stridor.   Cardiovascular: Negative for chest pain, palpitations, orthopnea and PND.  Gastrointestinal: Negative for abdominal pain, diarrhea, nausea and vomiting.  Genitourinary: Negative for frequency and urgency.  Musculoskeletal: Negative for back pain and joint pain.  Neurological: Negative for sensory change, speech change, focal weakness and weakness.  Psychiatric/Behavioral: Negative for depression and hallucinations. The patient is not nervous/anxious.    Tolerating Diet:yes Tolerating PT: Rehab  DRUG ALLERGIES:   Allergies  Allergen Reactions  . Contrast Media [Iodinated Diagnostic Agents] Shortness Of Breath  . Morphine And Related Other (See Comments)    Hallucinations   . Niacin And Related Dermatitis  . Other     Other reaction(s): SHORTNESS OF BREATH  . Amlodipine Rash    VITALS:  Blood pressure 117/74, pulse 63, temperature 97.6 F (36.4 C), temperature source Oral, resp. rate 18, height 5\' 6"  (1.676 m), weight 85.3 kg, SpO2 96 %.  PHYSICAL EXAMINATION:   Physical Exam  GENERAL:  85 y.o.-year-old patient lying in the bed with no acute distress.  EYES: Pupils equal, round, reactive to light and accommodation. No scleral icterus. Extraocular muscles intact.  HEENT: Head atraumatic, normocephalic. Oropharynx and nasopharynx clear.  NECK:  Supple, no jugular venous distention. No thyroid enlargement, no  tenderness.  LUNGS: Normal breath sounds bilaterally, no wheezing, rales, rhonchi. No use of accessory muscles of respiration.  CARDIOVASCULAR: S1, S2 normal. No murmurs, rubs, or gallops.  ABDOMEN: Soft, nontender, nondistended. Bowel sounds present. No organomegaly or mass.  EXTREMITIES: No cyanosis, clubbing or edema b/l.   Left foot dressing + right distal tibial shin cellulitis improved from skin marking NEUROLOGIC: Cranial nerves II through XII are intact. No focal Motor or sensory deficits b/l.   PSYCHIATRIC:  patient is alert and oriented x 3.  SKIN: No obvious rash, lesion, or ulcer.   LABORATORY PANEL:  CBC Recent Labs  Lab 06/11/18 0443  WBC 15.4*  HGB 12.4*  HCT 38.6*  PLT 218    Chemistries  Recent Labs  Lab 06/09/18 1302  06/11/18 0443  06/13/18 0322 06/14/18 0739  NA 140   < >  --    < > 138  --   K 4.1   < > 2.9*   < > 4.5 4.5  CL 100   < >  --    < > 105  --   CO2 29   < >  --    < > 26  --   GLUCOSE 115*   < >  --    < > 277*  --   BUN 50*   < >  --    < > 41*  --   CREATININE 1.62*   < > 1.52*   < > 1.62*  --   CALCIUM 9.4   < >  --    < > 8.8*  --   MG  --   --  2.5*  --   --   --  AST 14*  --   --   --   --   --   ALT 14  --   --   --   --   --   ALKPHOS 67  --   --   --   --   --   BILITOT 0.7  --   --   --   --   --    < > = values in this interval not displayed.   Cardiac Enzymes No results for input(s): TROPONINI in the last 168 hours. RADIOLOGY:  No results found. ASSESSMENT AND PLAN:  [name]  is a 85 y.o. male with a known history of CAD, A. fib, hypertension, diabetes, chronic diastolic CHF, CVA, COPD with oxygen at night, hyperlipidemia, neuropathy and foot ulcer, etc. The patient presents the ED with worsening left foot infection.  The patient has been following up with wound care for left foot ulcer, which has been worsening for the past few days.  *Left foot diabetic ulcer infection with leukocytosis, ruled out osteomyelitis  with negative MRI -Continue IV Zosyn. De-escalate antibiotics  -wound culture shows few gram-negative rods --MRI of left foot Soft tissue wound along the medial aspect of the first MTP joint. Small joint effusion of the first MTP joint with mild marrow edema at the base of the first proximal phalanx and head of the first metatarsal adjacent to the joint and soft tissue wound. No definite cortical destruction. -s/p Amputation left great toe with partial first ray resection by podiatry Dr.cline March 3  *Right tibial shin cellulitis improving from skin marking area. Patient already on broad-spectrum antibiotics. He has significant dry skin in the foot likely the source for cellulitis  *Peripheral vascular disease  -vascular consultappreciated. -s/p left LE angiogram with angioplasty of left ant tibial artery, left popliteal artery and stent placement of left SFA  *Diabetes.  Continue Levemir, insulin AC and start sliding scale. Sugars low in am--insulin changes made  *Diabetes neuropathy.  Continue current regimen.  *CKD.  Stage IV.  Stable.  *History of A. fib.  Continue Lopressor. will resume Eliquis now that surgery is over  Patient will discharged to rehab. He will need good wound care as outpatient.  Management plans discussed with the patient  CODE STATUS: Full code  DVT Prophylaxis:  eliquis TOTAL TIME TAKING CARE OF THIS PATIENT: 30 minutes.  >50% time spent on counselling and coordination of care  POSSIBLE D/C IN *1-2 DAYS, DEPENDING ON CLINICAL CONDITION.  Note: This dictation was prepared with Dragon dictation along with smaller phrase technology. Any transcriptional errors that result from this process are unintentional.  Fritzi Mandes M.D on 06/14/2018 at 11:31 AM  Between 7am to 6pm - Pager - [phone]  After 6pm go to [email or web address] - password [email or web address] [facility]  Office  [phone]  CC: Primary care physician; Jearld Fenton,  NPPatient ID: Shawn Grace., male   DOB: 1932-02-12, 85 y.o.   MRN: [id number]

## 2018-06-15 LAB — AEROBIC/ANAEROBIC CULTURE W GRAM STAIN (SURGICAL/DEEP WOUND): Special Requests: NORMAL

## 2018-06-15 LAB — BASIC METABOLIC PANEL
Anion gap: 6 (ref 5–15)
BUN: 36 mg/dL — ABNORMAL HIGH (ref 8–23)
CO2: 27 mmol/L (ref 22–32)
Calcium: 8.5 mg/dL — ABNORMAL LOW (ref 8.9–10.3)
Chloride: 107 mmol/L (ref 98–111)
Creatinine, Ser: 1.39 mg/dL — ABNORMAL HIGH (ref 0.61–1.24)
GFR calc Af Amer: 54 mL/min — ABNORMAL LOW (ref >60)
GFR calc non Af Amer: 46 mL/min — ABNORMAL LOW (ref >60)
Glucose, Bld: 137 mg/dL — ABNORMAL HIGH (ref 70–99)
Potassium: 4.2 mmol/L (ref 3.5–5.1)
Sodium: 140 mmol/L (ref 135–145)

## 2018-06-15 LAB — CBC
HCT: 36.3 % — ABNORMAL LOW (ref 39.0–52.0)
Hemoglobin: 11.2 g/dL — ABNORMAL LOW (ref 13.0–17.0)
MCH: 28.9 pg (ref 26.0–34.0)
MCHC: 30.9 g/dL (ref 30.0–36.0)
MCV: 93.8 fL (ref 80.0–100.0)
Platelets: 206 10*3/uL (ref 150–400)
RBC: 3.87 MIL/uL — AB (ref 4.22–5.81)
RDW: 16.7 % — ABNORMAL HIGH (ref 11.5–15.5)
WBC: 10.7 10*3/uL — AB (ref 4.0–10.5)
nRBC: 0 % (ref 0.0–0.2)

## 2018-06-15 LAB — AEROBIC/ANAEROBIC CULTURE (SURGICAL/DEEP WOUND)

## 2018-06-15 LAB — GLUCOSE, CAPILLARY
GLUCOSE-CAPILLARY: 73 mg/dL (ref 70–99)
Glucose-Capillary: 65 mg/dL — ABNORMAL LOW (ref 70–99)
Glucose-Capillary: 194 mg/dL — ABNORMAL HIGH (ref 70–99)
Glucose-Capillary: 250 mg/dL — ABNORMAL HIGH (ref 70–99)
Glucose-Capillary: 251 mg/dL — ABNORMAL HIGH (ref 70–99)

## 2018-06-15 MED ORDER — TRAMADOL HCL 50 MG PO TABS: 50.0000 mg | ORAL_TABLET | Freq: Two times a day (BID) | ORAL | 0 refills | Status: DC

## 2018-06-15 MED ORDER — ALPRAZOLAM 0.25 MG PO TABS: 0.2500 mg | ORAL_TABLET | Freq: Every evening | ORAL | 0 refills | Status: DC | PRN

## 2018-06-15 MED ORDER — CIPROFLOXACIN HCL 500 MG PO TABS: 500.0000 mg | ORAL_TABLET | Freq: Two times a day (BID) | ORAL | 0 refills | Status: AC

## 2018-06-15 MED ORDER — ASPIRIN 81 MG PO TBEC: 81.0000 mg | DELAYED_RELEASE_TABLET | Freq: Every day | ORAL | 0 refills | Status: DC

## 2018-06-15 MED ORDER — INSULIN LISPRO 100 UNIT/ML ~~LOC~~ SOLN: 4.0000 [IU] | Freq: Three times a day (TID) | SUBCUTANEOUS | 11 refills | Status: DC

## 2018-06-15 MED ORDER — METRONIDAZOLE 500 MG PO TABS: 500.0000 mg | ORAL_TABLET | Freq: Two times a day (BID) | ORAL | 0 refills | Status: AC

## 2018-06-15 MED ORDER — INSULIN DETEMIR 100 UNIT/ML ~~LOC~~ SOLN: 28.0000 [IU] | Freq: Every day | SUBCUTANEOUS | 11 refills | Status: DC

## 2018-06-15 NOTE — Progress Notes (Signed)
Patient ID: [name]., male   DOB: [birth year], 85 y.o.   MRN: [id number] Patient scheduled for transfer to skilled nursing.  May be able to weight-bear on the right heel with the OrthoWedge shoe for transfer only.  He will need a dry sterile dressing change every couple of days for the first week, then decreasing in frequency depending on bleeding or drainage.  We will follow-up in 1-2 weeks outpatient.

## 2018-06-15 NOTE — Anesthesia Postprocedure Evaluation (Signed)
Anesthesia Post Note  Patient: [name].  Procedure(s) Performed: 1st Ray Resection Left (Left Toe)  Patient location during evaluation: PACU Anesthesia Type: General Level of consciousness: awake and alert Pain management: pain level controlled Vital Signs Assessment: post-procedure vital signs reviewed and stable Respiratory status: spontaneous breathing, nonlabored ventilation, respiratory function stable and patient connected to nasal cannula oxygen Cardiovascular status: blood pressure returned to baseline and stable Postop Assessment: no apparent nausea or vomiting Anesthetic complications: no     Last Vitals:  Vitals:   06/15/18 0807 06/15/18 1028  BP: 124/62 (!) 108/94  Pulse: (!) 51 73  Resp: 18   Temp: 36.4 C   SpO2: 94%     Last Pain:  Vitals:   06/15/18 0807  TempSrc: Oral  PainSc: 0-No pain                 Precious Haws Sallye Lunz

## 2018-06-15 NOTE — Progress Notes (Signed)
Inpatient Diabetes Program Recommendations  AACE/ADA: New Consensus Statement on Inpatient Glycemic Control (2015)  Target Ranges:  Prepandial:   less than 140 mg/dL      Peak postprandial:   less than 180 mg/dL (1-2 hours)      Critically ill patients:  140 - 180 mg/dL   Lab Results  Component Value Date   GLUCAP 73 06/15/2018   HGBA1C 6.5 (H) 04/17/2018    Review of Glycemic Control Results for [name], [name]" (MRN [id number]) as of 06/15/2018 11:32  Ref. Range 06/14/2018 12:18 06/14/2018 16:40 06/14/2018 21:52 06/15/2018 08:16 06/15/2018 08:50  Glucose-Capillary Latest Ref Range: 70 - 99 mg/dL 223 (H) 198 (H) 272 (H) 65 (L) 73   Diabetes history: DM 2 Outpatient Diabetes medications:  Humalog 18 units with breakfast, 20 units with lunch and 16 units with dinner ( hold if <100 mg/dL) Levemir 42 units daily Current orders for Inpatient glycemic control:  Novolog sensitive tid with meals and HS Novolog 4 units tid with meals.  Levemir 32 units q HS Inpatient Diabetes Program Recommendations:   Note fasting CBG<70 mg/dL.  Please consider reducing Levemir to 28 units q HS.    Thanks  Adah Perl, RN, BC-ADM Inpatient Diabetes Coordinator Pager [phone] (8a-5p)

## 2018-06-15 NOTE — Discharge Summary (Signed)
[name] at [facility] NAME: [name]    MR#:  [id number]  DATE OF BIRTH:  November 13, 1931  DATE OF ADMISSION:  06/09/2018 ADMITTING PHYSICIAN: Shawn Loll, MD  DATE OF DISCHARGE: 06/15/2018  PRIMARY CARE PHYSICIAN: Shawn Fenton, NP    ADMISSION DIAGNOSIS:  L lateral LE ulcer  DISCHARGE DIAGNOSIS:  Left footdiabetic ulcerinfection s/p amputation left great toe by Dr [name] Severe Peripheral arterial disease s/p angiogram with angioplasty and stent placement  SECONDARY DIAGNOSIS:   Past Medical History:  Diagnosis Date  . Arthritis   . CAD    a. MI 01/29/1996 tx'd w/ TPA @ [facility]; b. Myoview 06/2005: EF 50%, scar @ apex, mild peri-infarct ischemia  . Cancer ([place])    skin  . Chronic atrial fibrillation    a. since 2006; b. on warfarin  . Chronic diastolic CHF (congestive heart failure) ([place])    a. echo 04/2006: EF lower limits of nl, mod LVH, mild aortic root dilatation, & mild MR, biatrial enlargement; b. echo 04/2013: EF 60%, mod dilated LA, mild MR & TR, mod pulm HTN w/ RV systolic pressure 53, c. echo 04/21/14: EF 55-60%, unable to exclude WMA, severely dilated LA 6.6 cm, nl RVSP, mildly dilated aortic root  . COPD (chronic obstructive pulmonary disease) (H[facility])    oxygen prn at home  . CVA [id number]   x2  . DM   . Falls   . GERD (gastroesophageal reflux disease)   . History of kidney stones   . HYPERLIPIDEMIA   . HYPERTENSION   . Kidney stone    a. s/p left ureteral stenting 04/24/14  . Left arm weakness    limited movement. S/P fall injury  . Neuropathy of both feet   . Poor balance   . Wears dentures    full upper and lower    HOSPITAL COURSE:   [name] 85 y.o.malewith a known history of CAD, A. fib, hypertension, diabetes, chronic diastolic CHF, CVA, COPD with oxygen at night, hyperlipidemia, neuropathy and foot ulcer, etc.Thepatient presents the ED with worsening left foot infection. The patient has  been following up with wound care for left foot ulcer,which has been worsening for the past few days.  *Left footdiabetic ulcerinfectionwith leukocytosis, ruled out osteomyelitis with negative MRI --wound culture shows ecoli and pseudomonas--Now on Po Cipro and flagyl (stop date march 12th) --MRI of left foot Soft tissue wound along the medial aspect of the first MTP joint. Small joint effusion of the first MTP joint with mild marrow edema at the base of the first proximal phalanx and head of the first metatarsal adjacent to the joint and soft tissue wound. No definite cortical destruction. -s/p Amputation left great toe with partial first ray resection by podiatry [name] March 3  *Right tibial shin cellulitis improving from skin marking area. Patient already on broad-spectrum antibiotics. He has significant dry skin in the foot likely the source for cellulitis  *Peripheral vascular disease  -vascular consultappreciated. -s/p left LE angiogram with angioplasty of left ant tibial artery, left popliteal artery and stent placement of left SFA  *Diabetes. Continue Shawn,insulin AC and start sliding scale. Sugars low in am--insulin changes made  *Diabetes neuropathy. Continue current regimen.  *CKD. Stage IV. Stable.  *History of A. fib. Continue Lopressor.will resume Eliquis now that surgery is over  Patient will discharged to rehab--[name] has offered bed--await [name] authorization   Management plans discussed with the patient  CODE STATUS: Full code  CONSULTS OBTAINED:  Treatment Team:  Shawn Billing, MD  DRUG ALLERGIES:   Allergies  Allergen Reactions  . Contrast Media [Iodinated Diagnostic Agents] Shortness Of Breath  . Morphine And Related Other (See Comments)    Hallucinations   . Niacin And Related Dermatitis  . Other     Other reaction(s): SHORTNESS OF BREATH  . Amlodipine Rash    DISCHARGE MEDICATIONS:   Allergies as of  06/15/2018      Reactions   Contrast Media [iodinated Diagnostic Agents] Shortness Of Breath   Morphine And Related Other (See Comments)   Hallucinations   Niacin And Related Dermatitis   Other    Other reaction(s): SHORTNESS OF BREATH   Amlodipine Rash      Medication List    STOP taking these medications   [name] 100 UNIT/ML [name] Generic drug:  insulin lispro Replaced by:  insulin lispro 100 UNIT/ML injection   [name] 5-1.5 MG/5ML syrup Commonly known as:  HYCODAN   [name] 100 UNIT/ML Pen Generic drug:  Insulin Detemir Replaced by:  insulin detemir 100 UNIT/ML injection     TAKE these medications   acetaminophen 650 MG CR tablet Commonly known as:  TYLENOL Take 650 mg by mouth every 8 (eight) hours as needed for pain.   albuterol 108 (90 Base) MCG/ACT inhaler Commonly known as:  PROAIR HFA USE 1 TO 2 INHALATIONS EVERY 4 HOURS AS NEEDED What changed:    how much to take  how to take this  when to take this  reasons to take this  additional instructions   ALPRAZolam 0.25 MG tablet Commonly known as:  XANAX Take 1 tablet (0.25 mg total) by mouth at bedtime as needed for anxiety.   apixaban 2.5 MG Tabs tablet Commonly known as:  ELIQUIS Take 1 tablet (2.5 mg total) by mouth 2 (two) times daily.   aspirin 81 MG EC tablet Take 1 tablet (81 mg total) by mouth daily. Start taking on:  June 16, 2018   atorvastatin 40 MG tablet Commonly known as:  LIPITOR TAKE 1 TABLET DAILY   ciprofloxacin 500 MG tablet Commonly known as:  CIPRO Take 1 tablet (500 mg total) by mouth 2 (two) times daily for 7 days.   citalopram 10 MG tablet Commonly known as:  CELEXA TAKE 1 TABLET DAILY   clotrimazole 1 % cream Commonly known as:  LOTRIMIN Apply 1 application topically 2 (two) times daily.   colchicine 0.6 MG tablet Commonly known as:  COLCRYS Take 1 tablet (0.6 mg total) by mouth 2 (two) times daily as needed (acute gout  flare).   diclofenac sodium 1 % Gel Commonly known as:  VOLTAREN Apply 4 g topically 4 (four) times daily.   dorzolamide 2 % ophthalmic solution Commonly known as:  TRUSOPT Place 1 drop into both eyes 3 (three) times daily.   ezetimibe 10 MG tablet Commonly known as:  ZETIA TAKE 1 TABLET DAILY   finasteride 5 MG tablet Commonly known as:  PROSCAR TAKE 1 TABLET DAILY   gabapentin 100 MG capsule Commonly known as:  NEURONTIN TAKE 1 CAPSULE THREE TIMES A DAY   INCRUSE ELLIPTA 62.5 MCG/INH Aepb Generic drug:  umeclidinium bromide Inhale 1 puff into the lungs daily.   insulin detemir 100 UNIT/ML injection Commonly known as:  Shawn Inject 0.28 mLs (28 Units total) into the skin daily at 10 pm. Replaces:  [name] 100 UNIT/ML Pen   insulin lispro 100 UNIT/ML injection Commonly known as:  Shawn  Inject 0.04 mLs (4 Units total) into the skin 3 (three) times daily with meals. Replaces:  [name] 100 UNIT/ML [name]   Insulin Pen Needle 32G X 4 MM Misc Commonly known as:  BD PEN NEEDLE NANO U/F USE THREE TIMES A DAY FOR INSULIN ADMINISTRATION   Insulin Syringe-Needle U-100 30G X 1/2" 1 ML Misc 1 each by Does not apply route 3 (three) times daily.   latanoprost 0.005 % ophthalmic solution Commonly known as:  XALATAN Place 1 drop into both eyes at bedtime.   levothyroxine 50 MCG tablet Commonly known as:  SYNTHROID, LEVOTHROID TAKE 1 TABLET DAILY BEFORE BREAKFAST   metolazone 5 MG tablet Commonly known as:  ZAROXOLYN Take 1 tablet (5 mg total) by mouth daily as needed (swelling).   metoprolol succinate 50 MG 24 hr tablet Commonly known as:  TOPROL-XL TAKE 1 TABLET TWICE A DAY   metroNIDAZOLE 500 MG tablet Commonly known as:  FLAGYL Take 1 tablet (500 mg total) by mouth every 12 (twelve) hours for 7 days.   montelukast 10 MG tablet Commonly known as:  SINGULAIR TAKE 1 TABLET AT BEDTIME   nitroGLYCERIN 0.4 MG SL tablet Commonly known as:   NITROSTAT Place 0.4 mg under the tongue every 5 (five) minutes as needed.   nystatin-triamcinolone cream Commonly known as:  MYCOLOG II Apply 1 application topically 2 (two) times daily.   potassium chloride SA 20 MEQ tablet Commonly known as:  K-DUR,KLOR-CON Take 4 tablets (80 meq) by mouth twice daily, take an extra 1 tablet (20 meq) on the days you take metolazone   predniSONE 10 MG tablet Commonly known as:  DELTASONE Take 1 tablet (10 mg total) by mouth daily with breakfast.   SYMBICORT 160-4.5 MCG/ACT inhaler Generic drug:  budesonide-formoterol USE 2 INHALATIONS TWICE A DAY What changed:  See the new instructions.   tamsulosin 0.4 MG Caps capsule Commonly known as:  FLOMAX Take 1 capsule (0.4 mg total) by mouth daily.   torsemide 20 MG tablet Commonly known as:  DEMADEX Take 40 mg by mouth 2 (two) times daily.   traMADol 50 MG tablet Commonly known as:  ULTRAM Take 1 tablet (50 mg total) by mouth 2 (two) times daily.   traZODone 50 MG tablet Commonly known as:  DESYREL Take 2 tablets (100 mg total) by mouth at bedtime as needed for sleep.       If you experience worsening of your admission symptoms, develop shortness of breath, life threatening emergency, suicidal or homicidal thoughts you must seek medical attention immediately by calling 911 or calling your MD immediately  if symptoms less severe.  You Must read complete instructions/literature along with all the possible adverse reactions/side effects for all the Medicines you take and that have been prescribed to you. Take any new Medicines after you have completely understood and accept all the possible adverse reactions/side effects.   Please note  You were cared for by a hospitalist during your hospital stay. If you have any questions about your discharge medications or the care you received while you were in the hospital after you are discharged, you can call the unit and asked to speak with the hospitalist  on call if the hospitalist that took care of you is not available. Once you are discharged, your primary care physician will handle any further medical issues. Please note that NO REFILLS for any discharge medications will be authorized once you are discharged, as it is imperative that you return to your primary  care physician (or establish a relationship with a primary care physician if you do not have one) for your aftercare needs so that they can reassess your need for medications and monitor your lab values. Today   SUBJECTIVE     VITAL SIGNS:  Blood pressure (!) 108/94, pulse 73, temperature 97.6 F (36.4 C), temperature source Oral, resp. rate 18, height 5\' 6"  (1.676 m), weight 85.3 kg, SpO2 94 %.  I/O:    Intake/Output Summary (Last 24 hours) at 06/15/2018 1228 Last data filed at 06/15/2018 1148 Gross per 24 hour  Intake 1154.76 ml  Output 1250 ml  Net -95.24 ml    PHYSICAL EXAMINATION:  GENERAL:  85 y.o.-year-old patient lying in the bed with no acute distress.  EYES: Pupils equal, round, reactive to light and accommodation. No scleral icterus. Extraocular muscles intact.  HEENT: Head atraumatic, normocephalic. Oropharynx and nasopharynx clear.  NECK:  Supple, no jugular venous distention. No thyroid enlargement, no tenderness.  LUNGS: Normal breath sounds bilaterally, no wheezing, rales,rhonchi or crepitation. No use of accessory muscles of respiration.  CARDIOVASCULAR: S1, S2 normal. No murmurs, rubs, or gallops.  ABDOMEN: Soft, non-tender, non-distended. Bowel sounds present. No organomegaly or mass.  EXTREMITIES: No pedal edema, cyanosis, or clubbing.  NEUROLOGIC: Cranial nerves II through XII are intact. Muscle strength 5/5 in all extremities. Sensation intact. Gait not checked.  PSYCHIATRIC: The patient is alert and oriented x 3.  SKIN: No obvious rash, lesion, or ulcer.   DATA REVIEW:   CBC  Recent Labs  Lab 06/15/18 0416  WBC 10.7*  HGB 11.2*  HCT 36.3*  PLT  206    Chemistries  Recent Labs  Lab 06/09/18 1302  06/11/18 0443  06/15/18 0416  NA 140   < >  --    < > 140  K 4.1   < > 2.9*   < > 4.2  CL 100   < >  --    < > 107  CO2 29   < >  --    < > 27  GLUCOSE 115*   < >  --    < > 137*  BUN 50*   < >  --    < > 36*  CREATININE 1.62*   < > 1.52*   < > 1.39*  CALCIUM 9.4   < >  --    < > 8.5*  MG  --   --  2.5*  --   --   AST 14*  --   --   --   --   ALT 14  --   --   --   --   ALKPHOS 67  --   --   --   --   BILITOT 0.7  --   --   --   --    < > = values in this interval not displayed.    Microbiology Results   Recent Results (from the past 240 hour(s))  Blood culture (routine x 2)     Status: None   Collection Time: 06/09/18  5:28 PM  Result Value Ref Range Status   Specimen Description BLOOD LFA  Final   Special Requests   Final    BOTTLES DRAWN AEROBIC AND ANAEROBIC Blood Culture results may not be optimal due to an excessive volume of blood received in culture bottles   Culture   Final    NO GROWTH 5 DAYS Performed at [facility], [address] [place], [place] [address]    Report Status 06/14/2018 FINAL  Final  Blood culture (routine x 2)     Status: None   Collection Time: 06/09/18  5:28 PM  Result Value Ref Range Status   Specimen Description BLOOD LAC  Final   Special Requests   Final    BOTTLES DRAWN AEROBIC AND ANAEROBIC Blood Culture results may not be optimal due to an excessive volume of blood received in culture bottles   Culture   Final    NO GROWTH 5 DAYS Performed at [facility], [address] [place], [place] [address]    Report Status 06/14/2018 FINAL  Final  Aerobic/Anaerobic Culture (surgical/deep wound)     Status: None   Collection Time: 06/09/18 10:35 PM  Result Value Ref Range Status   Specimen Description   Final    WOUND LEFT TOE GREATER Performed at [facility], [address], [place], [place] [address]    Special Requests   Final     Normal Performed at [facility], [address] [place], [place] [address]    Gram Stain   Final    FEW WBC PRESENT, PREDOMINANTLY PMN NO ORGANISMS SEEN    Culture   Final    FEW ESCHERICHIA COLI FEW PSEUDOMONAS AERUGINOSA NO ANAEROBES ISOLATED Performed at [facility], [place] [address], [place], [place] [address]    Report Status 06/15/2018 FINAL  Final   Organism ID, Bacteria ESCHERICHIA COLI  Final   Organism ID, Bacteria PSEUDOMONAS AERUGINOSA  Final      Susceptibility   Escherichia coli - MIC*    AMPICILLIN >=32 RESISTANT Resistant     CEFAZOLIN <=4 SENSITIVE Sensitive     CEFEPIME <=1 SENSITIVE Sensitive     CEFTAZIDIME <=1 SENSITIVE Sensitive     CEFTRIAXONE <=1 SENSITIVE Sensitive     CIPROFLOXACIN <=0.25 SENSITIVE Sensitive     GENTAMICIN <=1 SENSITIVE Sensitive     IMIPENEM <=0.25 SENSITIVE Sensitive     TRIMETH/SULFA <=20 SENSITIVE Sensitive     AMPICILLIN/SULBACTAM >=32 RESISTANT Resistant     PIP/TAZO <=4 SENSITIVE Sensitive     Extended ESBL NEGATIVE Sensitive     * FEW ESCHERICHIA COLI   Pseudomonas aeruginosa - MIC*    CEFTAZIDIME 2 SENSITIVE Sensitive     CIPROFLOXACIN <=0.25 SENSITIVE Sensitive     GENTAMICIN <=1 SENSITIVE Sensitive     IMIPENEM 1 SENSITIVE Sensitive     PIP/TAZO <=4 SENSITIVE Sensitive     CEFEPIME <=1 SENSITIVE Sensitive     * FEW PSEUDOMONAS AERUGINOSA  Surgical PCR screen     Status: None   Collection Time: 06/13/18  9:34 AM  Result Value Ref Range Status   MRSA, PCR NEGATIVE NEGATIVE Final   Staphylococcus aureus NEGATIVE NEGATIVE Final    Comment: (NOTE) The Xpert SA Assay (FDA approved for NASAL specimens in patients 23 years of age and older), is one component of a comprehensive surveillance program. It is not intended to diagnose infection nor to guide or monitor treatment. Performed at [facility], [address] [place], [place] [address]   Aerobic/Anaerobic Culture (surgical/deep wound)      Status: None (Preliminary result)   Collection Time: 06/13/18  3:33 PM  Result Value Ref Range Status   Specimen Description   Final    BONE Performed at [facility], [address], [place], [place] [address]    Special Requests   Final    RIGHT  GREAT TOE Performed at [facility], [address] [place], [place] [address]    Gram Stain   Final    RARE WBC PRESENT, PREDOMINANTLY MONONUCLEAR NO ORGANISMS SEEN    Culture   Final    RARE GRAM NEGATIVE RODS IDENTIFICATION AND SUSCEPTIBILITIES TO FOLLOW HOLDING FOR POSSIBLE ANAEROBE Performed at [facility], [place] [address], [place], [place] [address]    Report Status PENDING  Incomplete    RADIOLOGY:  No results found.   CODE STATUS:     Code Status Orders  (From admission, onward)         Start     Ordered   06/09/18 1856  Full code  Continuous     06/09/18 1855        Code Status History    Date Active Date Inactive Code Status Order ID Comments User Context   04/17/2018 1341 04/18/2018 7353 Full Code [id number]  Hillary Bow, MD ED   12/22/2017 0613 12/26/2017 2049 Full Code [id number]  [name], MD Inpatient   10/10/2016 1520 10/11/2016 1829 Full Code [id number]  Shawn Loll, MD Inpatient   10/10/2014 0258 10/11/2014 1704 Full Code [id number]  Harrie Foreman, MD Inpatient      TOTAL TIME TAKING CARE OF THIS PATIENT: *40* minutes.    Fritzi Mandes M.D on 06/15/2018 at 12:28 PM  Between 7am to 6pm - Pager - [phone] After 6pm go to [email or web address] - password [email or web address] [facility]  Office  [phone]  CC: Primary care physician; Shawn Fenton, NP

## 2018-06-15 NOTE — Progress Notes (Signed)
Physical Therapy Treatment Patient Details Name: [name]. MRN: [id number] DOB: [birth year] Today's Date: 06/15/2018    History of Present Illness Pt is an 85 y.o. male presenting to hospital 06/09/18 with worsening wound L LE.  Pt admitted with L diabetic foot ulcer infection with leukocytosis and R tibial shin cellulitis.  Pt s/p L LE angio with stent placement L SFA.  Pt also s/p amputation L great toe with partial 1st ray resection 06/13/18.  PT continue at transfer order received post-op.  PMH includes CAD, a-fib, CHF, COPD with O2 at night, DM, htn, neuropathy.    PT Comments    Pt with nursing staff needing to use bedside commode upon arrival.  Bed mobility without assist to right and left side of bed.  He is able to sit without support and good balance.  Wedge shoe was donned and he was able to stand with min a x 2.  Pt was reminded to maintain ttwb L heel during transfers.  He transferred to commode and was able to have a BM and care provided by nursing.  He transferred back to bed and was able to lay down and get back up on other side of bed to sit in recliner.  Transferred again with similar assist.  Participated in exercises as described below.  Discharge plan discussed with pt and daughter.  Pt stated he would only go to [facility] for rehab and would return home if they did not have a bed.  SNF would be of benefit to pt as general weakness and balance limit his mobility at home due to touch down heel wb status in wedge shoe.  If pt refuses rehab/SNF he does have equipment at home including wheelchair, walker and commode to allow for wheelchair level activity at home and was encouraged to no walk until cleared by MD.  He does have a set of stairs to enter/exit.  Discussed ways to use wheelchair to bump up into home or ramp rental services.  He was encouraged to look into building a ramp even if he does go to rehab to allow for easier discharge to home.  Voiced understanding.     Follow Up Recommendations  SNF     Equipment Recommendations       Recommendations for Other Services       Precautions / Restrictions Precautions Precautions: Fall Precaution Comments: L LE orthowedge shoe Restrictions Weight Bearing Restrictions: Yes LLE Weight Bearing: Touchdown weight bearing Other Position/Activity Restrictions: updated to heel only in wedge tdwb per 3/4 Dr. Cleda Mccreedy note    Mobility  Bed Mobility Overal bed mobility: Needs Assistance Bed Mobility: Supine to Sit;Sit to Supine     Supine to sit: Supervision;HOB elevated        Transfers Overall transfer level: Needs assistance Equipment used: Rolling walker (2 wheeled)   Sit to Stand: Min assist;+2 physical assistance         General transfer comment: Pt able to stand today and transfer x 3 during session with min a x 2 for safety.  Ambulation/Gait   Gait Distance (Feet): 3 Feet Assistive device: Rolling walker (2 wheeled) Gait Pattern/deviations: Step-to pattern Gait velocity: dereased   General Gait Details: ttwb with wedg shoe to heel only.  Gait limited to transfer only as pt continues to have difficulty maintaining WB status for more than a few steps.   Stairs             Emergency planning/management officer  Modified Rankin (Stroke Patients Only)       Balance Overall balance assessment: Needs assistance Sitting-balance support: No upper extremity supported;Feet unsupported Sitting balance-Leahy Scale: Good     Standing balance support: Bilateral upper extremity supported Standing balance-Leahy Scale: Fair Standing balance comment: Relies on walker for support but no LOB or buckling noted.                            Cognition Arousal/Alertness: Awake/alert Behavior During Therapy: WFL for tasks assessed/performed Overall Cognitive Status: Within Functional Limits for tasks assessed                                        Exercises Other  Exercises Other Exercises: seated AROM for LAQ, marches and ab/add x 2 x 10.  Verbal review of HEP for supine exercies.    General Comments        Pertinent Vitals/Pain Pain Assessment: No/denies pain    Home Living                      Prior Function            PT Goals (current goals can now be found in the care plan section) Progress towards PT goals: Progressing toward goals    Frequency    7X/week      PT Plan Current plan remains appropriate    Co-evaluation              AM-PAC PT "6 Clicks" Mobility   Outcome Measure  Help needed turning from your back to your side while in a flat bed without using bedrails?: A Little Help needed moving from lying on your back to sitting on the side of a flat bed without using bedrails?: A Little Help needed moving to and from a bed to a chair (including a wheelchair)?: A Little Help needed standing up from a chair using your arms (e.g., wheelchair or bedside chair)?: A Little Help needed to walk in hospital room?: A Lot Help needed climbing 3-5 steps with a railing? : Total 6 Click Score: 15    End of Session Equipment Utilized During Treatment: Gait belt;Other (comment) Activity Tolerance: Patient tolerated treatment well Patient left: in chair;with call bell/phone within reach;with chair alarm set;with family/visitor present Nurse Communication: Mobility status Pain - Right/Left: Left Pain - part of body: Ankle and joints of foot     Time: 1001-1024 PT Time Calculation (min) (ACUTE ONLY): 23 min  Charges:  $Therapeutic Exercise: 8-22 mins $Therapeutic Activity: 8-22 mins                     Chesley Noon, PTA 06/15/18, 10:45 AM

## 2018-06-15 NOTE — Care Management (Signed)
Patient wants to change to [facility] from [facility], Corene Cornea with [facility] been Notified .If patient goes Home with Home health,

## 2018-06-15 NOTE — Progress Notes (Signed)
Pharmacy Electrolyte Monitoring Consult:  Pharmacy consulted to assist in monitoring and replacing electrolytes in this 85 y.o. male admitted on 06/09/2018 with No chief complaint on file.   Labs:  Sodium (mmol/L)  Date Value  06/15/2018 140  02/21/2015 143  08/05/2014 137   Potassium (mmol/L)  Date Value  06/15/2018 4.2  08/05/2014 3.6   Magnesium (mg/dL)  Date Value  06/11/2018 2.5 (H)  04/20/2014 2.1   Phosphorus (mg/dL)  Date Value  05/13/2016 3.6  04/20/2014 3.0   Calcium (mg/dL)  Date Value  06/15/2018 8.5 (L)   Calcium, Total (mg/dL)  Date Value  08/05/2014 8.6 (L)   Albumin (g/dL)  Date Value  06/09/2018 3.3 (L)  05/02/2014 2.4 (L)    Assessment/Plan: Per consult, MD requesting oral replacement, Hx Afib  3/5 Electrolytes WNL.  KCL 80 mEq BID (reported home dose) ordered by physician. Will continue to monitor K+ while on high dose KCL supplementation for a few days.  No replacement needed at this time.   Will recheck labs in AM and continue to replace as needed.   Paulina Fusi, PharmD, BCPS 06/15/2018 9:30 AM

## 2018-06-15 NOTE — Progress Notes (Signed)
Per [facility] admissions coordinator at [facility] authorization is still pending. Patient's daughter Butch Penny is aware of above.   [employer], LCSW [phone]

## 2018-06-15 NOTE — Progress Notes (Addendum)
[facility] NAME: [name]    MR#:  [id number]  DATE OF BIRTH:  1931-11-10  SUBJECTIVE:   Patient denies any complaints.  right tibial shin distal redness improiving No pain.s/p surgery POD # 2 REVIEW OF SYSTEMS:   Review of Systems  Constitutional: Negative for chills, fever and weight loss.  HENT: Negative for ear discharge, ear pain and nosebleeds.   Eyes: Negative for blurred vision, pain and discharge.  Respiratory: Negative for sputum production, shortness of breath, wheezing and stridor.   Cardiovascular: Negative for chest pain, palpitations, orthopnea and PND.  Gastrointestinal: Negative for abdominal pain, diarrhea, nausea and vomiting.  Genitourinary: Negative for frequency and urgency.  Musculoskeletal: Negative for back pain and joint pain.  Neurological: Negative for sensory change, speech change, focal weakness and weakness.  Psychiatric/Behavioral: Negative for depression and hallucinations. The patient is not nervous/anxious.    Tolerating Diet:yes Tolerating PT: Rehab  DRUG ALLERGIES:   Allergies  Allergen Reactions  . Contrast Media [Iodinated Diagnostic Agents] Shortness Of Breath  . Morphine And Related Other (See Comments)    Hallucinations   . Niacin And Related Dermatitis  . Other     Other reaction(s): SHORTNESS OF BREATH  . Amlodipine Rash    VITALS:  Blood pressure 124/62, pulse (!) 51, temperature 97.6 F (36.4 C), temperature source Oral, resp. rate 18, height 5\' 6"  (1.676 m), weight 85.3 kg, SpO2 94 %.  PHYSICAL EXAMINATION:   Physical Exam  GENERAL:  85 y.o.-year-old patient lying in the bed with no acute distress.  EYES: Pupils equal, round, reactive to light and accommodation. No scleral icterus. Extraocular muscles intact.  HEENT: Head atraumatic, normocephalic. Oropharynx and nasopharynx clear.  NECK:  Supple, no jugular venous distention. No thyroid enlargement, no  tenderness.  LUNGS: Normal breath sounds bilaterally, no wheezing, rales, rhonchi. No use of accessory muscles of respiration.  CARDIOVASCULAR: S1, S2 normal. No murmurs, rubs, or gallops.  ABDOMEN: Soft, nontender, nondistended. Bowel sounds present. No organomegaly or mass.  EXTREMITIES: No cyanosis, clubbing or edema b/l.   Left foot dressing + right distal tibial shin cellulitis improved from skin marking NEUROLOGIC: Cranial nerves II through XII are intact. No focal Motor or sensory deficits b/l.   PSYCHIATRIC:  patient is alert and oriented x 3.  SKIN: No obvious rash, lesion, or ulcer.   LABORATORY PANEL:  CBC Recent Labs  Lab 06/15/18 0416  WBC 10.7*  HGB 11.2*  HCT 36.3*  PLT 206    Chemistries  Recent Labs  Lab 06/09/18 1302  06/11/18 0443  06/15/18 0416  NA 140   < >  --    < > 140  K 4.1   < > 2.9*   < > 4.2  CL 100   < >  --    < > 107  CO2 29   < >  --    < > 27  GLUCOSE 115*   < >  --    < > 137*  BUN 50*   < >  --    < > 36*  CREATININE 1.62*   < > 1.52*   < > 1.39*  CALCIUM 9.4   < >  --    < > 8.5*  MG  --   --  2.5*  --   --   AST 14*  --   --   --   --   ALT 14  --   --   --   --  ALKPHOS 67  --   --   --   --   BILITOT 0.7  --   --   --   --    < > = values in this interval not displayed.   Cardiac Enzymes No results for input(s): TROPONINI in the last 168 hours. RADIOLOGY:  No results found. ASSESSMENT AND PLAN:  [name]  is a 85 y.o. male with a known history of CAD, A. fib, hypertension, diabetes, chronic diastolic CHF, CVA, COPD with oxygen at night, hyperlipidemia, neuropathy and foot ulcer, etc. The patient presents the ED with worsening left foot infection.  The patient has been following up with wound care for left foot ulcer, which has been worsening for the past few days.  *Left foot diabetic ulcer infection with leukocytosis, ruled out osteomyelitis with negative MRI --wound culture shows ecoli and pseudomonas--Now on Po Cipro  and flagyl (stop date march 12th) --MRI of left foot Soft tissue wound along the medial aspect of the first MTP joint. Small joint effusion of the first MTP joint with mild marrow edema at the base of the first proximal phalanx and head of the first metatarsal adjacent to the joint and soft tissue wound. No definite cortical destruction. -s/p Amputation left great toe with partial first ray resection by podiatry Dr.cline March 3  *Right tibial shin cellulitis improving from skin marking area. Patient already on broad-spectrum antibiotics. He has significant dry skin in the foot likely the source for cellulitis  *Peripheral vascular disease  -vascular consultappreciated. -s/p left LE angiogram with angioplasty of left ant tibial artery, left popliteal artery and stent placement of left SFA  *Diabetes.  Continue Levemir, insulin AC and start sliding scale. Sugars low in am--insulin changes made  *Diabetes neuropathy.  Continue current regimen.  *CKD.  Stage IV.  Stable.  *History of A. fib.  Continue Lopressor. will resume Eliquis now that surgery is over  Patient will discharged to rehab--[facility] has offered bed--await [facility] authorization   Management plans discussed with the patient  CODE STATUS: Full code  DVT Prophylaxis:  eliquis TOTAL TIME TAKING CARE OF THIS PATIENT: 30 minutes.  >50% time spent on counselling and coordination of care  POSSIBLE D/C IN *1-2 DAYS, DEPENDING ON CLINICAL CONDITION.  Note: This dictation was prepared with Dragon dictation along with smaller phrase technology. Any transcriptional errors that result from this process are unintentional.  Fritzi Mandes M.D on 06/15/2018 at 9:10 AM  Between 7am to 6pm - Pager - [phone]  After 6pm go to [email or web address] - password [email or web address] [facility]  Office  [phone]  CC: Primary care physician; Jearld Fenton, NPPatient ID: [name]., male   DOB: 12-28-1931, 85 y.o.    MRN: [id number]

## 2018-06-15 NOTE — Discharge Instructions (Signed)
Keep groin access site clean and dry.   Dressing changes on surgical foot per Dr Sammuel Bailiff recommendations

## 2018-06-15 NOTE — Care Management Important Message (Signed)
Important Message  Patient Details  Name: [name]. MRN: [id number] Date of Birth: November 03, 1931   Medicare Important Message Given:  Yes    Juliann Pulse A Jasman Pfeifle 06/15/2018, 12:36 PM

## 2018-06-15 NOTE — Clinical Social Work Note (Signed)
[facility] offered. Bed accepted and Magda Paganini at [facility] informed to begin [facility] as soon as possible because patient is ready for discharge. Shela Leff MSW,LCSW [phone]

## 2018-06-16 ENCOUNTER — Ambulatory Visit: Payer: Medicare Other | Admitting: Physician Assistant

## 2018-06-16 LAB — SURGICAL PATHOLOGY

## 2018-06-16 LAB — BASIC METABOLIC PANEL
Anion gap: 5 (ref 5–15)
BUN: 33 mg/dL — ABNORMAL HIGH (ref 8–23)
CO2: 25 mmol/L (ref 22–32)
Calcium: 8.3 mg/dL — ABNORMAL LOW (ref 8.9–10.3)
Chloride: 109 mmol/L (ref 98–111)
Creatinine, Ser: 1.35 mg/dL — ABNORMAL HIGH (ref 0.61–1.24)
GFR calc Af Amer: 55 mL/min — ABNORMAL LOW (ref >60)
GFR, EST NON AFRICAN AMERICAN: 48 mL/min — AB (ref >60)
Glucose, Bld: 168 mg/dL — ABNORMAL HIGH (ref 70–99)
Potassium: 4.3 mmol/L (ref 3.5–5.1)
Sodium: 139 mmol/L (ref 135–145)

## 2018-06-16 LAB — GLUCOSE, CAPILLARY
Glucose-Capillary: 120 mg/dL — ABNORMAL HIGH (ref 70–99)
Glucose-Capillary: 168 mg/dL — ABNORMAL HIGH (ref 70–99)

## 2018-06-16 NOTE — Progress Notes (Signed)
Patient discharged to [facility]. Transportation provided by patient's son. Report given to Nurse Lollie Marrow.  [name]

## 2018-06-16 NOTE — Progress Notes (Signed)
3 Days Post-Op   Subjective/Chief Complaint: Patient seen.  Doing well and no complaints of pain at this point.   Objective: Vital signs in last 24 hours: Temp:  [97.5 F (36.4 C)-98 F (36.7 C)] 97.6 F (36.4 C) (03/06 0820) Pulse Rate:  [62-76] 76 (03/06 0820) Resp:  [18] 18 (03/05 2220) BP: (114-145)/(65-76) 114/76 (03/06 0820) SpO2:  [96 %-97 %] 96 % (03/06 0818) Last BM Date: 06/14/18  Intake/Output from previous day: 03/05 0701 - 03/06 0700 In: 720 [P.O.:720] Out: 1630 [Urine:1630] Intake/Output this shift: Total I/O In: 240 [P.O.:240] Out: 300 [Urine:300]  Mild bleeding and drainage on the bandage.  Incisions are well coapted.  Possibly some early incisional necrosis along the inferior aspect of the original wound.  Lab Results:  Recent Labs    06/15/18 0416  WBC 10.7*  HGB 11.2*  HCT 36.3*  PLT 206   BMET Recent Labs    06/15/18 0416 06/16/18 0516  NA 140 139  K 4.2 4.3  CL 107 109  CO2 27 25  GLUCOSE 137* 168*  BUN 36* 33*  CREATININE 1.39* 1.35*  CALCIUM 8.5* 8.3*   PT/INR No results for input(s): LABPROT, INR in the last 72 hours. ABG No results for input(s): PHART, HCO3 in the last 72 hours.  Invalid input(s): PCO2, PO2  Studies/Results: No results found.  Anti-infectives: Anti-infectives (From admission, onward)   Start     Dose/Rate Route Frequency Ordered Stop   06/15/18 1000  metroNIDAZOLE (FLAGYL) tablet 500 mg     500 mg Oral Every 12 hours 06/14/18 1855 06/22/18 2359   06/15/18 0800  ciprofloxacin (CIPRO) tablet 500 mg     500 mg Oral 2 times daily 06/14/18 1855 06/22/18 2359   06/15/18 0000  ciprofloxacin (CIPRO) 500 MG tablet     500 mg Oral 2 times daily 06/15/18 1225 06/22/18 2359   06/15/18 0000  metroNIDAZOLE (FLAGYL) 500 MG tablet     500 mg Oral Every 12 hours 06/15/18 1225 06/22/18 2359   06/13/18 1900  oseltamivir (TAMIFLU) capsule 30 mg  Status:  Discontinued     30 mg Oral 2 times daily 06/13/18 1845 06/13/18  1848   06/13/18 1534  vancomycin (VANCOCIN) powder  Status:  Discontinued       As needed 06/13/18 1534 06/13/18 1556   06/11/18 2000  vancomycin (VANCOCIN) 1,750 mg in sodium chloride 0.9 % 500 mL IVPB  Status:  Discontinued     1,750 mg 250 mL/hr over 120 Minutes Intravenous Every 48 hours 06/09/18 1841 06/11/18 1608   06/11/18 1700  vancomycin (VANCOCIN) IVPB 1000 mg/200 mL premix  Status:  Discontinued     1,000 mg 200 mL/hr over 60 Minutes Intravenous Every 36 hours 06/11/18 1608 06/13/18 0833   06/09/18 2200  piperacillin-tazobactam (ZOSYN) IVPB 3.375 g  Status:  Discontinued     3.375 g 12.5 mL/hr over 240 Minutes Intravenous Every 12 hours 06/09/18 1819 06/09/18 2016   06/09/18 2200  piperacillin-tazobactam (ZOSYN) IVPB 3.375 g     3.375 g 12.5 mL/hr over 240 Minutes Intravenous Every 8 hours 06/09/18 2016 06/14/18 2359   06/09/18 1845  vancomycin (VANCOCIN) IVPB 1000 mg/200 mL premix  Status:  Discontinued     1,000 mg 200 mL/hr over 60 Minutes Intravenous  Once 06/09/18 1832 06/11/18 0818   06/09/18 1615  vancomycin (VANCOCIN) IVPB 1000 mg/200 mL premix     1,000 mg 200 mL/hr over 60 Minutes Intravenous  Once 06/09/18 1612 06/09/18 1838  Assessment/Plan: s/p Procedure(s): 1st Ray Resection Left (Left) Assessment: Stable status post ray resection left foot.   Plan: Betadine and a sterile bandage reapplied.  Patient will need a dry sterile dressing change every other day at skilled nursing.  Has an appointment previously scheduled for next Tuesday which he will keep.  Follow-up next week outpatient.  LOS: 7 days    [name] 06/16/2018

## 2018-06-16 NOTE — Discharge Summary (Signed)
[place] at [facility] 85 y.o., DOB [birth year], MRN [id number]. Admission date: 06/09/2018 Discharge Date 06/16/2018 Primary MD Jearld Fenton, NP Admitting Physician Demetrios Loll, MD  Admission Diagnosis  L lateral LE ulcer  Discharge Diagnosis   Active Problems:     *Left footdiabetic ulcerinfection-s/p Amputation left great toe with partial first ray resection by podiatry Dr.cline March 3 *Right tibial shin cellulitis *Peripheral vascular disease  -vascular consultappreciated. -s/p left LE angiogram with angioplasty of left ant tibial artery, left popliteal artery and stent placement of left SFA *Diabetes. Continue Levemir,insulin AC and start sliding scale. Sugars low in am--insulin changes made *Diabetes neuropathy. Continue current regimen. *CKD. Stage IV. Stable. *History of A. fib. Continue Lopressor.will resume Eliquis now that surgery is over        [place] y.o.malewith a known history of CAD, A. fib, hypertension, diabetes, chronic diastolic CHF, CVA, COPD with oxygen at night, hyperlipidemia, neuropathy and foot ulcer, etc.Thepatient presents the ED with worsening left foot infection. The patient has been following up with wound care for left foot ulcer,which has been worsening for the past few days.  Patient was seen by podiatry and underwent amputation of the left great toe with partial first ray resection on March 3.  Patient also was seen by vascular for peripheral vascular disease status post left lower extremity angiogram with angioplasty of the left anterior tibial artery, left popliteal artery and stent placement of the last SFA.  Patient doing better now and has been cleared by vascular and podiatry to be discharged.  Antibiotic duration for 7 more days   Activity:  May be able to weight-bear on the right heel with the OrthoWedge shoe for transfer only.      Dressing change  he will need a dry sterile dressing change every couple of days for the first week, then decreasing in frequency depending on bleeding or drainage.  We will follow-up in 1-2 weeks outpatient.       Consults  vascular surgery, podiatry consult  Significant Tests:  See full reports for all details     Mr Foot Left Wo Contrast  Result Date: 06/10/2018 CLINICAL DATA:  85 year old male with diabetes well-known to me outpatient who has had a sore on his left foot recently being managed by the wound care center. Clinical exam suspicious for osteomyelitis. EXAM: MRI OF THE LEFT FOOT WITHOUT CONTRAST TECHNIQUE: Multiplanar, multisequence MR imaging of the left foot was performed. No intravenous contrast was administered. COMPARISON:  None. FINDINGS: Bones/Joint/Cartilage Soft tissue wound along the medial aspect of the first MTP joint. Small joint effusion of the first MTP joint. Marrow edema at the base of the first proximal phalanx and head of the first metatarsal adjacent to the joint and soft tissue wound. No definite cortical destruction. 0.7 x 1.3 cm fluid collection along the plantar lateral aspect of the base of the great toe. Marrow edema and mild T1 hypointensity in the medial hallux sesamoid without cortical destruction. Marrow edema in the navicular, lateral cuneiform and to lesser extent medial and middle cuneiforms likely reflecting reactive marrow changes secondary to degenerative change and developing Charcot joint versus stress reaction. No other marrow signal abnormality. No periosteal reaction or bone destruction. No acute fracture or dislocation. Mild osteoarthritis of the first MTP joint. Ligaments Collateral ligaments are intact.  Lisfranc ligament is intact. Muscles and Tendons Flexor, peroneal and extensor compartment tendons are intact. Soft tissue 14  x 7 mm cystic mass along the dorsal aspect of the navicular-cuneiform joint most consistent with a ganglion cyst.  No soft tissue mass. Soft tissue edema along the dorsal aspect of the foot with skin thickening concerning for cellulitis. IMPRESSION: 1. Soft tissue wound along the medial aspect of the first MTP joint. Small joint effusion of the first MTP joint with mild marrow edema at the base of the first proximal phalanx and head of the first metatarsal adjacent to the joint and soft tissue wound. No definite cortical destruction. Differential considerations include mild septic arthritis versus reactive changes secondary to adjacent inflammation. 0.7 x 1.3 cm fluid collection along the plantar lateral aspect of the base of the great toe concerning for a small abscess. 2. No other areas of osteomyelitis. Electronically Signed   By: Kathreen Devoid   On: 06/10/2018 08:35   Dg Foot Complete Left  Result Date: 06/09/2018 CLINICAL DATA:  Left great toe nonhealing wound. History of diabetes. EXAM: LEFT FOOT - COMPLETE 3+ VIEW COMPARISON:  05/26/2018. FINDINGS: Again demonstrated is soft tissue swelling and mild irregularity medial to the 1st MTP joint. No soft tissue gas, bone destruction or periosteal reaction. Diffuse dorsal soft tissue swelling is slightly less prominent. Moderate-sized inferior posterior calcaneal spurs are again demonstrated. IMPRESSION: No radiographic evidence of osteomyelitis. Electronically Signed   By: Claudie Revering M.D.   On: 06/09/2018 16:56   Dg Foot Complete Left  Result Date: 05/26/2018 CLINICAL DATA:  Infection the left foot for 1 week. Infection at great toe. History of diabetes. EXAM: LEFT FOOT - COMPLETE 3+ VIEW COMPARISON:  None. FINDINGS: No soft tissue gas or bony erosion noted. Soft tissue swelling over the dorsum of the foot. No fracture or dislocation. IMPRESSION: No bony erosion to suggest osteomyelitis.  No soft tissue gas noted. Electronically Signed   By: Dorise Bullion III M.D   On: 05/26/2018 20:41   Vas [place] Abi With/wo Tbi  Result Date: 06/02/2018 LOWER EXTREMITY DOPPLER  STUDY Indications: Peripheral artery disease, and Left leg venous stasis ulcers. High Risk Factors: Diabetes.  Comparison Study: 05/18/2014 Performing Technologist: Concha Norway RVT  Examination Guidelines: A complete evaluation includes at minimum, Doppler waveform signals and systolic blood pressure reading at the level of bilateral brachial, anterior tibial, and posterior tibial arteries, when vessel segments are accessible. Bilateral testing is considered an integral part of a complete examination. Photoelectric Plethysmograph (PPG) waveforms and toe systolic pressure readings are included as required and additional duplex testing as needed. Limited examinations for reoccurring indications may be performed as noted.  ABI Findings: +---------+------------------+-----+--------+--------+ Right    Rt Pressure (mmHg)IndexWaveformComment  +---------+------------------+-----+--------+--------+ Brachial 133                                     +---------+------------------+-----+--------+--------+ ATA      156               1.17 biphasic         +---------+------------------+-----+--------+--------+ PTA      172               1.29 biphasic         +---------+------------------+-----+--------+--------+ Great Toe69                0.52 Abnormal         +---------+------------------+-----+--------+--------+ +---------+------------------+-----+----------+-------+ Left     Lt Pressure (mmHg)IndexWaveform  Comment +---------+------------------+-----+----------+-------+ Brachial 130                                      +---------+------------------+-----+----------+-------+  ATA      98                0.74 monophasic        +---------+------------------+-----+----------+-------+ PTA      73                0.55 monophasic        +---------+------------------+-----+----------+-------+ Great Toe23                0.17 Abnormal           +---------+------------------+-----+----------+-------+ +-------+-----------+-----------+------------+------------+ ABI/TBIToday's ABIToday's TBIPrevious ABIPrevious TBI +-------+-----------+-----------+------------+------------+ Right  1.29       .52        1.6         not done     +-------+-----------+-----------+------------+------------+ Left   .74        .17        .75         not done     +-------+-----------+-----------+------------+------------+ Bilateral ABIs appear essentially unchanged compared to prior study on 05/18/2014.  Summary: Right: Resting right ankle-brachial index is within normal range. No evidence of significant right lower extremity arterial disease. The right toe-brachial index is abnormal. Left: Resting left ankle-brachial index indicates moderate left lower extremity arterial disease. The left toe-brachial index is abnormal.  *See table(s) above for measurements and observations.  Vascular consult recommended. Electronically signed by Leotis Pain MD on 06/02/2018 at 5:06:32 PM.   Final        Today   Subjective:   [name] patient doing well denies any complaints  Objective:   Blood pressure 114/76, pulse 76, temperature 97.6 F (36.4 C), temperature source Oral, resp. rate 18, height 5\' 6"  (1.676 m), weight 85.3 kg, SpO2 96 %.  .  Intake/Output Summary (Last 24 hours) at 06/16/2018 1258 Last data filed at 06/16/2018 1001 Gross per 24 hour  Intake 720 ml  Output 1430 ml  Net -710 ml    Exam VITAL SIGNS: Blood pressure 114/76, pulse 76, temperature 97.6 F (36.4 C), temperature source Oral, resp. rate 18, height 5\' 6"  (1.676 m), weight 85.3 kg, SpO2 96 %.  GENERAL:  85 y.o.-year-old patient lying in the bed with no acute distress.  EYES: Pupils equal, round, reactive to light and accommodation. No scleral icterus. Extraocular muscles intact.  HEENT: Head atraumatic, normocephalic. Oropharynx and nasopharynx clear.  NECK:  Supple, no jugular  venous distention. No thyroid enlargement, no tenderness.  LUNGS: Normal breath sounds bilaterally, no wheezing, rales,rhonchi or crepitation. No use of accessory muscles of respiration.  CARDIOVASCULAR: S1, S2 normal. No murmurs, rubs, or gallops.  ABDOMEN: Soft, nontender, nondistended. Bowel sounds present. No organomegaly or mass.  EXTREMITIES: No pedal edema, cyanosis, or clubbing.  NEUROLOGIC: Cranial nerves II through XII are intact. Muscle strength 5/5 in all extremities. Sensation intact. Gait not checked.  PSYCHIATRIC: The patient is alert and oriented x 3.  SKIN: No obvious rash, lesion, or ulcer.   Data Review     CBC w Diff:  Lab Results  Component Value Date   WBC 10.7 (H) 06/15/2018   HGB 11.2 (L) 06/15/2018   HGB 13.3 08/05/2014   HCT 36.3 (L) 06/15/2018   HCT 40.1 08/05/2014   PLT 206 06/15/2018   PLT 246 08/05/2014   LYMPHOPCT 11 06/09/2018   LYMPHOPCT 13.1 08/05/2014   MONOPCT 8 06/09/2018   MONOPCT 11.4 08/05/2014   EOSPCT 0 06/09/2018   EOSPCT 0.7 08/05/2014   BASOPCT 0 06/09/2018  BASOPCT 0.6 08/05/2014   CMP:  Lab Results  Component Value Date   NA 139 06/16/2018   NA 143 02/21/2015   NA 137 08/05/2014   K 4.3 06/16/2018   K 3.6 08/05/2014   CL 109 06/16/2018   CL 106 08/05/2014   CO2 25 06/16/2018   CO2 23 08/05/2014   BUN 33 (H) 06/16/2018   BUN 26 02/21/2015   BUN 19 08/05/2014   CREATININE 1.35 (H) 06/16/2018   CREATININE 1.20 08/05/2014   PROT 7.2 06/09/2018   PROT 7.0 05/02/2014   ALBUMIN 3.3 (L) 06/09/2018   ALBUMIN 2.4 (L) 05/02/2014   BILITOT 0.7 06/09/2018   BILITOT 0.3 05/02/2014   ALKPHOS 67 06/09/2018   ALKPHOS 111 05/02/2014   AST 14 (L) 06/09/2018   AST 6 (L) 05/02/2014   ALT 14 06/09/2018   ALT 22 05/02/2014  .  Micro Results Recent Results (from the past 240 hour(s))  Blood culture (routine x 2)     Status: None   Collection Time: 06/09/18  5:28 PM  Result Value Ref Range Status   Specimen Description BLOOD  LFA  Final   Special Requests   Final    BOTTLES DRAWN AEROBIC AND ANAEROBIC Blood Culture results may not be optimal due to an excessive volume of blood received in culture bottles   Culture   Final    NO GROWTH 5 DAYS Performed at [facility], [address] [place], [place] [address]    Report Status 06/14/2018 FINAL  Final  Blood culture (routine x 2)     Status: None   Collection Time: 06/09/18  5:28 PM  Result Value Ref Range Status   Specimen Description BLOOD LAC  Final   Special Requests   Final    BOTTLES DRAWN AEROBIC AND ANAEROBIC Blood Culture results may not be optimal due to an excessive volume of blood received in culture bottles   Culture   Final    NO GROWTH 5 DAYS Performed at [facility], [address], [place], [place] [address]    Report Status 06/14/2018 FINAL  Final  Aerobic/Anaerobic Culture (surgical/deep wound)     Status: None   Collection Time: 06/09/18 10:35 PM  Result Value Ref Range Status   Specimen Description   Final    WOUND LEFT TOE GREATER Performed at [facility], [address], [place], [place] [address]    Special Requests   Final    Normal Performed at [facility], [address] [place], [place] [address]    Gram Stain   Final    FEW WBC PRESENT, PREDOMINANTLY PMN NO ORGANISMS SEEN    Culture   Final    FEW ESCHERICHIA COLI FEW PSEUDOMONAS AERUGINOSA NO ANAEROBES ISOLATED Performed at [facility], [place] [address], [place], [place] [address]    Report Status 06/15/2018 FINAL  Final   Organism ID, Bacteria ESCHERICHIA COLI  Final   Organism ID, Bacteria PSEUDOMONAS AERUGINOSA  Final      Susceptibility   Escherichia coli - MIC*    AMPICILLIN >=32 RESISTANT Resistant     CEFAZOLIN <=4 SENSITIVE Sensitive     CEFEPIME <=1 SENSITIVE Sensitive     CEFTAZIDIME <=1 SENSITIVE Sensitive     CEFTRIAXONE <=1 SENSITIVE Sensitive     CIPROFLOXACIN <=0.25 SENSITIVE Sensitive      GENTAMICIN <=1 SENSITIVE Sensitive     IMIPENEM <=0.25 SENSITIVE Sensitive     TRIMETH/SULFA <=20 SENSITIVE Sensitive  AMPICILLIN/SULBACTAM >=32 RESISTANT Resistant     PIP/TAZO <=4 SENSITIVE Sensitive     Extended ESBL NEGATIVE Sensitive     * FEW ESCHERICHIA COLI   Pseudomonas aeruginosa - MIC*    CEFTAZIDIME 2 SENSITIVE Sensitive     CIPROFLOXACIN <=0.25 SENSITIVE Sensitive     GENTAMICIN <=1 SENSITIVE Sensitive     IMIPENEM 1 SENSITIVE Sensitive     PIP/TAZO <=4 SENSITIVE Sensitive     CEFEPIME <=1 SENSITIVE Sensitive     * FEW PSEUDOMONAS AERUGINOSA  Surgical PCR screen     Status: None   Collection Time: 06/13/18  9:34 AM  Result Value Ref Range Status   MRSA, PCR NEGATIVE NEGATIVE Final   Staphylococcus aureus NEGATIVE NEGATIVE Final    Comment: (NOTE) The Xpert SA Assay (FDA approved for NASAL specimens in patients 19 years of age and older), is one component of a comprehensive surveillance program. It is not intended to diagnose infection nor to guide or monitor treatment. Performed at [facility], [address] [place], [place] [address]   Aerobic/Anaerobic Culture (surgical/deep wound)     Status: None (Preliminary result)   Collection Time: 06/13/18  3:33 PM  Result Value Ref Range Status   Specimen Description   Final    BONE Performed at [facility], [address], [place], [place] [address]    Special Requests   Final    RIGHT GREAT TOE Performed at [facility], [name] [place], [place] [address]    Gram Stain   Final    RARE WBC PRESENT, PREDOMINANTLY MONONUCLEAR NO ORGANISMS SEEN Performed at [facility], [place] [address] [place], [place] [address]    Culture   Final    RARE PSEUDOMONAS AERUGINOSA NO ANAEROBES ISOLATED; CULTURE IN PROGRESS FOR 5 DAYS    Report Status PENDING  Incomplete   Organism ID, Bacteria PSEUDOMONAS AERUGINOSA  Final      Susceptibility   Pseudomonas aeruginosa - MIC*     CEFTAZIDIME 2 SENSITIVE Sensitive     CIPROFLOXACIN <=0.25 SENSITIVE Sensitive     GENTAMICIN <=1 SENSITIVE Sensitive     IMIPENEM 1 SENSITIVE Sensitive     PIP/TAZO <=4 SENSITIVE Sensitive     CEFEPIME <=1 SENSITIVE Sensitive     * RARE PSEUDOMONAS AERUGINOSA        Code Status Orders  (From admission, onward)         Start     Ordered   06/09/18 1856  Full code  Continuous     06/09/18 1855        Code Status History    Date Active Date Inactive Code Status Order ID Comments User Context   04/17/2018 1341 04/18/2018 1749 Full Code [id number]  Hillary Bow, MD ED   12/22/2017 0613 12/26/2017 2049 Full Code [id number]  [name], MD Inpatient   10/10/2016 1520 10/11/2016 [place] Full Code [id number]  Demetrios Loll, MD Inpatient   10/10/2014 0258 10/11/2014 1704 Full Code [id number]  Harrie Foreman, MD Inpatient           Contact information for follow-up providers    Algernon Huxley, MD On 07/17/2018.   Specialties:  Vascular Surgery, Radiology, Interventional Cardiology Why:  Can see Dew or midlevel. Patient will need ABI's with visit. @ 10:00 am Contact information: [place] [address] [phone]        Baity, Regina W, NP. Schedule an appointment as soon as possible for a  visit on 06/22/2018.   Specialties:  Internal Medicine, Emergency Medicine Why:  hosp f/u; @ 2:45 pm Contact information: [place] [address] [phone]        Sharlotte Alamo, DPM. Schedule an appointment as soon as possible for a visit on 06/23/2018.   Specialty:  Podiatry Why:  (NOTE: Appt for 06/20/18 is CANCELLED)  post foot surgery; @ 9:45 am Contact information: [place] [address] [phone]            Contact information for after-discharge care    [facility] .   Service:  Skilled Nursing Contact information: [address]  [place] [phone]                  Discharge Medications   Allergies as of 06/16/2018      Reactions   Contrast Media [iodinated Diagnostic Agents] Shortness Of Breath   Morphine And Related Other (See Comments)   Hallucinations   Niacin And Related Dermatitis   Other    Other reaction(s): SHORTNESS OF BREATH   Amlodipine Rash      Medication List    STOP taking these medications   HumaLOG KwikPen 100 UNIT/ML KwikPen Generic drug:  insulin lispro Replaced by:  insulin lispro 100 UNIT/ML injection   HYDROcodone-homatropine 5-1.5 MG/5ML syrup Commonly known as:  HYCODAN   Levemir FlexTouch 100 UNIT/ML Pen Generic drug:  Insulin Detemir Replaced by:  insulin detemir 100 UNIT/ML injection     TAKE these medications   acetaminophen 650 MG CR tablet Commonly known as:  TYLENOL Take 650 mg by mouth every 8 (eight) hours as needed for pain.   albuterol 108 (90 Base) MCG/ACT inhaler Commonly known as:  ProAir HFA USE 1 TO 2 INHALATIONS EVERY 4 HOURS AS NEEDED What changed:    how much to take  how to take this  when to take this  reasons to take this  additional instructions   ALPRAZolam 0.25 MG tablet Commonly known as:  XANAX Take 1 tablet (0.25 mg total) by mouth at bedtime as needed for anxiety.   apixaban 2.5 MG Tabs tablet Commonly known as:  ELIQUIS Take 1 tablet (2.5 mg total) by mouth 2 (two) times daily.   aspirin 81 MG EC tablet Take 1 tablet (81 mg total) by mouth daily.   atorvastatin 40 MG tablet Commonly known as:  LIPITOR TAKE 1 TABLET DAILY   ciprofloxacin 500 MG tablet Commonly known as:  CIPRO Take 1 tablet (500 mg total) by mouth 2 (two) times daily for 7 days.   citalopram 10 MG tablet Commonly known as:  CELEXA TAKE 1 TABLET DAILY   clotrimazole 1 % cream Commonly known as:  LOTRIMIN Apply 1 application topically 2 (two) times daily.   colchicine 0.6 MG tablet Commonly known as:  Colcrys Take 1 tablet (0.6 mg total)  by mouth 2 (two) times daily as needed (acute gout flare).   diclofenac sodium 1 % Gel Commonly known as:  VOLTAREN Apply 4 g topically 4 (four) times daily.   dorzolamide 2 % ophthalmic solution Commonly known as:  TRUSOPT Place 1 drop into both eyes 3 (three) times daily.   ezetimibe 10 MG tablet Commonly known as:  ZETIA TAKE 1 TABLET DAILY   finasteride 5 MG tablet Commonly known as:  PROSCAR TAKE 1 TABLET DAILY   gabapentin 100 MG capsule Commonly known as:  NEURONTIN  TAKE 1 CAPSULE THREE TIMES A DAY   Incruse Ellipta 62.5 MCG/INH Aepb Generic drug:  umeclidinium bromide Inhale 1 puff into the lungs daily.   insulin detemir 100 UNIT/ML injection Commonly known as:  LEVEMIR Inject 0.28 mLs (28 Units total) into the skin daily at 10 pm. Replaces:  Levemir FlexTouch 100 UNIT/ML Pen   insulin lispro 100 UNIT/ML injection Commonly known as:  HumaLOG Inject 0.04 mLs (4 Units total) into the skin 3 (three) times daily with meals. Replaces:  HumaLOG KwikPen 100 UNIT/ML KwikPen   Insulin Pen Needle 32G X 4 MM Misc Commonly known as:  BD Pen Needle Nano U/F USE THREE TIMES A DAY FOR INSULIN ADMINISTRATION   Insulin Syringe-Needle U-100 30G X 1/2" 1 ML Misc 1 each by Does not apply route 3 (three) times daily.   latanoprost 0.005 % ophthalmic solution Commonly known as:  XALATAN Place 1 drop into both eyes at bedtime.   levothyroxine 50 MCG tablet Commonly known as:  SYNTHROID, LEVOTHROID TAKE 1 TABLET DAILY BEFORE BREAKFAST   metolazone 5 MG tablet Commonly known as:  ZAROXOLYN Take 1 tablet (5 mg total) by mouth daily as needed (swelling).   metoprolol succinate 50 MG 24 hr tablet Commonly known as:  TOPROL-XL TAKE 1 TABLET TWICE A DAY   metroNIDAZOLE 500 MG tablet Commonly known as:  FLAGYL Take 1 tablet (500 mg total) by mouth every 12 (twelve) hours for 7 days.   montelukast 10 MG tablet Commonly known as:  SINGULAIR TAKE 1 TABLET AT BEDTIME    nitroGLYCERIN 0.4 MG SL tablet Commonly known as:  NITROSTAT Place 0.4 mg under the tongue every 5 (five) minutes as needed.   nystatin-triamcinolone cream Commonly known as:  MYCOLOG II Apply 1 application topically 2 (two) times daily.   potassium chloride SA 20 MEQ tablet Commonly known as:  K-DUR,KLOR-CON Take 4 tablets (80 meq) by mouth twice daily, take an extra 1 tablet (20 meq) on the days you take metolazone   predniSONE 10 MG tablet Commonly known as:  DELTASONE Take 1 tablet (10 mg total) by mouth daily with breakfast.   Symbicort 160-4.5 MCG/ACT inhaler Generic drug:  budesonide-formoterol USE 2 INHALATIONS TWICE A DAY What changed:  See the new instructions.   tamsulosin 0.4 MG Caps capsule Commonly known as:  FLOMAX Take 1 capsule (0.4 mg total) by mouth daily.   torsemide 20 MG tablet Commonly known as:  DEMADEX Take 40 mg by mouth 2 (two) times daily.   traMADol 50 MG tablet Commonly known as:  ULTRAM Take 1 tablet (50 mg total) by mouth 2 (two) times daily.   traZODone 50 MG tablet Commonly known as:  DESYREL Take 2 tablets (100 mg total) by mouth at bedtime as needed for sleep.          Total Time in preparing paper work, data evaluation and todays exam - 82 minutes  Dustin Flock M.D on 06/16/2018 at 12:58 PM [place]  [phone]

## 2018-06-16 NOTE — Clinical Social Work Note (Addendum)
CSW received phone call that [facility] has received insurance authorization and can accept patient today if he is medically ready for discharge and orders have been received.  Patient to be d/c'ed today to [facility].  Patient and family agreeable to plans will transport via son's car RN to call report to [phone].  Patient's son is aware that patient discharging today.  Jones Broom. [place], MSW, [place]  06/16/2018 12:52 PM

## 2018-06-16 NOTE — Progress Notes (Signed)
Pharmacy Electrolyte Monitoring Consult:  Pharmacy consulted to assist in monitoring and replacing electrolytes in this 85 y.o. male admitted on 06/09/2018 with No chief complaint on file.   Labs:  Sodium (mmol/L)  Date Value  06/16/2018 139  02/21/2015 143  08/05/2014 137   Potassium (mmol/L)  Date Value  06/16/2018 4.3  08/05/2014 3.6   Magnesium (mg/dL)  Date Value  06/11/2018 2.5 (H)  04/20/2014 2.1   Phosphorus (mg/dL)  Date Value  05/13/2016 3.6  04/20/2014 3.0   Calcium (mg/dL)  Date Value  06/16/2018 8.3 (L)   Calcium, Total (mg/dL)  Date Value  08/05/2014 8.6 (L)   Albumin (g/dL)  Date Value  06/09/2018 3.3 (L)  05/02/2014 2.4 (L)    Assessment/Plan: Per consult, MD requesting oral replacement, Hx Afib  3/5 Electrolytes WNL.  KCL 80 mEq BID (reported home dose) ordered by physician. Will continue to monitor K+ while on high dose KCL supplementation for a few days.  No replacement needed at this time.   Will recheck labs in AM and continue to replace as needed.   Paulina Fusi, PharmD, BCPS 06/16/2018 10:18 AM

## 2018-06-18 ENCOUNTER — Telehealth: Payer: Self-pay | Admitting: Family Medicine

## 2018-06-18 LAB — AEROBIC/ANAEROBIC CULTURE W GRAM STAIN (SURGICAL/DEEP WOUND)

## 2018-06-18 NOTE — Telephone Encounter (Signed)
On call note Called on 3/8 at 5 PM with positive  FINAL culture from foot  Collected on 3/3 showing pseudomonas.  it appears this was the final.  Pt discharged from hospital on cipro and flagyl... appears pseudomonas had already be noted and treated. I attempted to call pt to verify that he was still taking antibiotics but was unable to reach several times.  Per discharge summary.. he may be in SNF currently as well.  FYI to PCP

## 2018-06-19 DIAGNOSIS — S98132A Complete traumatic amputation of one left lesser toe, initial encounter: Secondary | ICD-10-CM | POA: Insufficient documentation

## 2018-06-19 DIAGNOSIS — I739 Peripheral vascular disease, unspecified: Secondary | ICD-10-CM | POA: Insufficient documentation

## 2018-06-19 NOTE — Telephone Encounter (Signed)
[facility] Patient Name: TYSHON FANNING Gender: Male DOB: 1931-05-10 Age: 85 Y 62 M 4 D Return Phone Number: [phone] (Primary) Address: [address] City/State/Zip: [place] [address] Client [facility] Night - Client Client Site [place] Physician Webb Silversmith - NP Contact Type Call Who Is Calling Lab Lab Name [facility] Phone Number [phone] Lab Tech Name [facility] Lab Reference Number Chief Complaint Lab Result (Critical or Stat) Call Type Lab Send to RN Reason for Call Report lab results Initial Comment Caller with [facility] and is needing to report critical cultures. Translation No Nurse Assessment Nurse: Reasor, RN, Leah Date/Time (Eastern Time): 06/18/2018 3:59:41 PM Confirm and document reason for call. If symptomatic, describe symptoms. ---Caller with [facility] and is needing to report critical cultures. Has the patient traveled to [place], [place], [place], [place], or [place] OR had close contact with a person known to have the novel coronavirus illness in the last 14 days? ---Not Applicable Does the patient have any new or worsening symptoms? ---No Nurse: Reasor, RN, Denny Peon Date/Time (Eastern Time): 06/18/2018 3:59:46 PM Is there an on-call provider listed? ---Yes Please list name of person reporting value (Lab Employee) and a contact number. ---Philippa Chester with [facility]: [phone] Please document the following items: Lab name Lab value (read back to lab to verify) Reference range for lab value Date and time blood was drawn Collect time of birth for bilirubin results ---[facility] aerobic/anaerobic cultures from surgical sample (toe) gm stain = no organisms seen culture = gm negative rod, pseudomonas aeruginosa 06-13-18 sample collected Please collect the patient contact information from the lab. (name,  phone number and address) ---[name] [place], [place], [place] Guidelines Guideline Title Affirmed Question Affirmed Notes Nurse Date/Time Eilene Ghazi Time) Disp. Time Eilene Ghazi Time) Disposition Final User 06/18/2018 4:07:43 PM Paged On Call back to [facility], RN, Denny Peon 06/18/2018 4:38:48 PM Clinical Call Yes Reasor, RN, Denny Peon PLEASE NOTE: All timestamps contained within this report are represented as [place] Standard Time. CONFIDENTIALTY NOTICE: This fax transmission is intended only for the addressee. It contains information that is legally privileged, confidential or otherwise protected from use or disclosure. If you are not the intended recipient, you are strictly prohibited from reviewing, disclosing, copying using or disseminating any of this information or taking any action in reliance on or regarding this information. If you have received this fax in error, please notify us immediately by telephone so that we can arrange for its return to [place]. Phone: [phone], Toll-Free: [phone], Fax: [phone] Page: 2 of 2 Call Id: [id number] Paging DoctorName Phone DateTime Result/Outcome Message Type Notes Eliezer Lofts - MD [phone] 06/18/2018 4:07:43 PM Paged On Call Back to Call Center Doctor Paged Dorris Singh, RN @ [phone], Milas Gain, Amy - MD 06/18/2018 4:38:38 PM Spoke with On Call - General Message Result OC MD given lab results.

## 2018-06-19 NOTE — Telephone Encounter (Signed)
noted 

## 2018-06-19 NOTE — Progress Notes (Signed)
CODEE, TUTSON ([id number]) Visit Report for 05/18/2018 Chief Complaint Document Details Patient Name: [name], [name]. Date of Service: 05/18/2018 1:00 PM Medical Record Number: [id number] Patient Account Number: [id number] Date of Birth/Sex: 1932/01/21 (85 y.o. M) Treating RN: Harold Barban Primary Care Provider: Webb Silversmith Other Clinician: Referring Provider: Webb Silversmith Treating Provider/Extender: Beather Arbour Weeks in Treatment: 0 Information Obtained from: Patient Chief Complaint Left lateral LE ulcer Electronic Signature(s) Signed: 05/20/2018 6:43:56 PM By: Beather Arbour FNP-C Entered By: Beather Arbour on 05/20/2018 17:56:44 [name] ([id number]) -------------------------------------------------------------------------------- HPI Details Patient Name: Shawn Bark T. Date of Service: 05/18/2018 1:00 PM Medical Record Number: [id number] Patient Account Number: [id number] Date of Birth/Sex: [birth year] (85 y.o. M) Treating RN: Harold Barban Primary Care Provider: Webb Silversmith Other Clinician: Referring Provider: Webb Silversmith Treating Provider/Extender: Oneida Arenas in Treatment: 0 History of Present Illness Associated Signs and Symptoms: Patient has a history of lymphedema, type II diabetes mellitus, long-term use of Eliquis, congestive heart failure, and COPD. HPI Description: 12/06/17 on evaluation today patient is here for evaluation of an ulcer on the left lateral lower extremity which has been present for roughly 2-3 months. Currently he has been using silver alginate and it was considered putting the patient in an [employer] wrap. However he did have arterial studies which actually showed to be fairly normal on the right however on the left he did have an ABI of 0.75. With that being said this ABI was consistent with at least mild-to-moderate arterial disease. The patient actually has some question on the test as to whether or not this result  was actually even accurate his arterial disease could be more significant. For that reason I think he may need to see a vascular specialist to see about possibly undergoing an angiogram to see if arterial flow could be improved. With that being said it does appear that the patient rather desperately needs compression therapy he has significant edema of the bilateral lower extremities noted during evaluation today. No fevers, chills, nausea, or vomiting noted at this time. 12/16/17 on evaluation today patient actually appears to be doing better in regard to his lower extremity ulcers of the left lower extremity. Both wounds are significantly smaller and improved compared to the initial evaluation last week. Overall I'm very pleased with the progress at this time. The patient likewise is pleased with how things appear. He states that he has thought about it and really does not want to have an angiogram that would prefer not to go see the vascular specialist which we had recommended last week. He states when he had this previous on top of the fact that he had issues with getting the groin area to stop bleeding he states that the die also seemed to cause issues in fact he states through him into having another heart attack. Obviously he has good reasons to want to try to avoid this which I completely understand. 01/02/18 on evaluation today patient actually appears to be showing signs of good improvement which is excellent news. He has been tolerating the dressing changes without complication. Unfortunately he was in the hospital due to an infection of his left forearm he was on IV antibiotics for six days. The good news is he seems to be doing much better in that regard his left lower extremity also is doing better at this point. There does not appear to be any evidence of infection at this time which is great news. Overall I'm very pleased with the  progress he seems to be making. 01/30/18 patient has not  been seen our office actually since 23 September. At that point following he apparently healed but then had several areas that we opened shortly following. He has new areas in three different spots on evaluation today. This is on the left lower extremity were the ones previously were. With that being said there does not appear to be any evidence of infection at this time which is good news. No fevers, chills, nausea, or vomiting noted at this time. 02/10/18 on evaluation today patient actually appears to be doing excellent in regard to his lower extremities. He just has one wound that appears to still be open at this time. There does not appear to be any evidence of infection currently. With that being said he has been tolerating the compression that we been utilizing without complication. No fevers, chills, nausea, or vomiting noted at this time. 02/24/18 on evaluation today patient's lower extremity ulcer actually shows signs of good improvement at this time. Fortunately there is no evidence of infection. Overall he has some pain but nothing too significant. 03/17/18 on evaluation today patient appears to be doing better in regard to the ulcer on his left lower extremity. This is roughly half the size it was when I saw him last at least. Maybe more. Nonetheless overall he seems to be doing excellent with the Current wound care measures. 03/31/18 on evaluation today patient actually appears to be doing very well in regard to his lower extremity ulcer. He does have two areas that are leaking on the anterior portion of his lower extremity but no true ulcerations. The ulcer on the lateral portion of his lower extremity actually is doing better although it still is a little bit open at this point. It has a good granulation surface. CAI, ANFINSON ([id number]) Readmission 05/18/2018 85 year old male with history of lymphedema, type II diabetes mellitus, long-term use of Eliquis, congestive  heart failure, and COPD. He was last seen at wound center December 2019. Presents today with bilateral wounds to his great toes and a wound to the left fifth metatarsal head area. Increased erythema and edema of the left lower leg. Unable to obtain ABIs. He has a small pin tip size area that is draining fluid. He did mention that there has been an increase in his weight up 185 pounds. He was instructed by his cardiologist to take his prescribed metolazone medication with elevated weight parameters >180 pounds. Inquired how often he has had to take metolazone for weight gain and he stated only once over the last couple of days. Encouraged him to follow-up with his cardiologist for heart failure management and weight fluctuation. VSS. Denies recent fever, chills, dizziness, or SOB. No acute distress during exam today. Electronic Signature(s) Signed: 05/20/2018 6:43:56 PM By: Beather Arbour FNP-C Entered By: Beather Arbour on 05/20/2018 18:04:38 Fleener, Gavin [name] ([id number]) -------------------------------------------------------------------------------- Physical Exam Details Patient Name: Shawn Bark T. Date of Service: 05/18/2018 1:00 PM Medical Record Number: [id number] Patient Account Number: [id number] Date of Birth/Sex: [birth year] (85 y.o. M) Treating RN: Harold Barban Primary Care Provider: Webb Silversmith Other Clinician: Referring Provider: Webb Silversmith Treating Provider/Extender: Oneida Arenas in Treatment: 0 Constitutional Patient is mildly obese.Marland Kitchen appears in no distress. Eyes Conjunctivae clear. No discharge. Ears, Nose, Mouth, and Throat External ears and nose are within normal limits No lesions present.Marland Kitchen Respiratory Respiratory effort is easy and symmetric bilaterally. Rate is normal at rest and on room air.. Cardiovascular Edema present in  left extremity. Gastrointestinal (GI) Abdomen is soft and non-distended without masses or tenderness. Bowel sounds active in  all quadrants.. Musculoskeletal Digits and nails without infection, petechiae, ischemia, or inflammatory conditions.. Integumentary (Hair, Skin) Wound bilateral LE. Psychiatric Judgement and insight intact.. Notes Wound exam: Wound to the right and left great toe and left fifth metatarsal head. Wounds appeared dark in color with hardening surrounding tissue. Wounds would benefit from debriding however unable to debride during this visit due to unobtainable ABIs. Edema and erythema present to the left lower leg with dry flaky skin. Leg is fairly warm to touch. He denies pain or tenderness. Small area of clear drainage to the left leg.Will obtain x-ray of left foot, referred to vein and vascular for arterial studies, and a seven-day course of Keflex. He requested not to be prescribed doxycycline as he stated it Interactive with his medications in the past. Electronic Signature(s) Signed: 05/20/2018 6:43:56 PM By: Beather Arbour FNP-C Entered By: Beather Arbour on 05/20/2018 18:33:02 Bardsley, Gavin [name] ([id number]) -------------------------------------------------------------------------------- Physician Orders Details Patient Name: Shawn Bark T. Date of Service: 05/18/2018 1:00 PM Medical Record Number: [id number] Patient Account Number: [id number] Date of Birth/Sex: Jan 22, 1932 (85 y.o. M) Treating RN: Harold Barban Primary Care Provider: Webb Silversmith Other Clinician: Referring Provider: Webb Silversmith Treating Provider/Extender: Oneida Arenas in Treatment: 0 Verbal / Phone Orders: No Diagnosis Coding Wound Cleansing Wound #6 Right Toe Great o Cleanse wound with mild soap and water Wound #7 Left Toe Great o Cleanse wound with mild soap and water Wound #8 Left,Medial Metatarsal head first o Cleanse wound with mild soap and water Primary Wound Dressing Wound #6 Right Toe Great o Silver Alginate - Apply Betadine to Left First Met Head Wound #7 Left Toe Great o  Silver Alginate - Apply Betadine to Left First Met Head Wound #8 Left,Medial Metatarsal head first o Silver Alginate - Apply Betadine to Left First Met Head Secondary Dressing Wound #6 Right Toe Great o Dry Gauze o Conform/Kerlix Wound #7 Left Toe Great o Dry Gauze o Conform/Kerlix Wound #8 Left,Medial Metatarsal head first o Dry Gauze o Conform/Kerlix Dressing Change Frequency Wound #6 Right Toe Great o Change dressing every other day. Wound #7 Left Toe Great o Change dressing every other day. Wound #8 Left,Medial Metatarsal head first o Change dressing every other day. Follow-up Appointments DEROLD, DORSCH ([id number]) Wound #6 Right Toe Great o Return Appointment in 1 week. Wound #7 Left Toe Great o Return Appointment in 1 week. Wound #8 Left,Medial Metatarsal head first o Return Appointment in 1 week. Off-Loading Wound #7 Left Toe Great o Open toe surgical shoe to: - Both feet Wound #8 Left,Medial Metatarsal head first o Open toe surgical shoe to: - Both feet Medications-please add to medication list. Wound #6 Right Toe Great o P.O. Antibiotics - Finish antibiotic Wound #7 Left Toe Great o P.O. Antibiotics - Finish antibiotic Wound #8 Left,Medial Metatarsal head first o P.O. Antibiotics - Finish antibiotic Radiology o X-ray, foot - Left Foot to include Left Great Toe and First Met Head, 2-view Electronic Signature(s) Signed: 05/20/2018 6:43:56 PM By: Beather Arbour FNP-C Signed: 06/19/2018 10:54:06 AM By: Harold Barban Entered By: Harold Barban on 05/18/2018 13:49:42 Llanas, Gavin [name] ([id number]) -------------------------------------------------------------------------------- Problem List Details Patient Name: Shawn Bark T. Date of Service: 05/18/2018 1:00 PM Medical Record Number: [id number] Patient Account Number: [id number] Date of Birth/Sex: [birth year] (85 y.o. M) Treating RN: Harold Barban Primary Care  Provider: Webb Silversmith Other Clinician: Referring Provider: Webb Silversmith Treating  Provider/Extender: Beather Arbour Weeks in Treatment: 0 Active Problems ICD-10 Evaluated Encounter Code Description Active Date Today Diagnosis I89.0 Lymphedema, not elsewhere classified 05/18/2018 No Yes L97.822 Non-pressure chronic ulcer of other part of left lower leg with 05/18/2018 No Yes fat layer exposed E11.622 Type 2 diabetes mellitus with other skin ulcer 05/18/2018 No Yes Z79.01 Long term (current) use of anticoagulants 05/18/2018 No Yes I50.42 Chronic combined systolic (congestive) and diastolic 12/16/1882 No Yes (congestive) heart failure J44.9 Chronic obstructive pulmonary disease, unspecified 05/18/2018 No Yes L03.116 Cellulitis of left lower limb 05/18/2018 No Yes L97.818 Non-pressure chronic ulcer of other part of right lower leg 05/18/2018 No Yes with other specified severity Inactive Problems Resolved Problems Electronic Signature(s) JATINDER, MCDONAGH ([id number]) Signed: 05/20/2018 6:43:56 PM By: Beather Arbour FNP-C Entered By: Beather Arbour on 05/20/2018 18:40:07 Po, Gavin [name] ([id number]) -------------------------------------------------------------------------------- Progress Note Details Patient Name: Shawn Bark T. Date of Service: 05/18/2018 1:00 PM Medical Record Number: [id number] Patient Account Number: [id number] Date of Birth/Sex: June 27, 1931 (85 y.o. M) Treating RN: Harold Barban Primary Care Provider: Webb Silversmith Other Clinician: Referring Provider: Webb Silversmith Treating Provider/Extender: Oneida Arenas in Treatment: 0 Subjective Chief Complaint Information obtained from Patient Left lateral LE ulcer History of Present Illness (HPI) The following HPI elements were documented for the patient's wound: Associated Signs and Symptoms: Patient has a history of lymphedema, type II diabetes mellitus, long-term use of Eliquis, congestive heart failure, and  COPD. 12/06/17 on evaluation today patient is here for evaluation of an ulcer on the left lateral lower extremity which has been present for roughly 2-3 months. Currently he has been using silver alginate and it was considered putting the patient in an [employer] wrap. However he did have arterial studies which actually showed to be fairly normal on the right however on the left he did have an ABI of 0.75. With that being said this ABI was consistent with at least mild-to-moderate arterial disease. The patient actually has some question on the test as to whether or not this result was actually even accurate his arterial disease could be more significant. For that reason I think he may need to see a vascular specialist to see about possibly undergoing an angiogram to see if arterial flow could be improved. With that being said it does appear that the patient rather desperately needs compression therapy he has significant edema of the bilateral lower extremities noted during evaluation today. No fevers, chills, nausea, or vomiting noted at this time. 12/16/17 on evaluation today patient actually appears to be doing better in regard to his lower extremity ulcers of the left lower extremity. Both wounds are significantly smaller and improved compared to the initial evaluation last week. Overall I'm very pleased with the progress at this time. The patient likewise is pleased with how things appear. He states that he has thought about it and really does not want to have an angiogram that would prefer not to go see the vascular specialist which we had recommended last week. He states when he had this previous on top of the fact that he had issues with getting the groin area to stop bleeding he states that the die also seemed to cause issues in fact he states through him into having another heart attack. Obviously he has good reasons to want to try to avoid this which I completely understand. 01/02/18 on  evaluation today patient actually appears to be showing signs of good improvement which is excellent news. He has been tolerating the  dressing changes without complication. Unfortunately he was in the hospital due to an infection of his left forearm he was on IV antibiotics for six days. The good news is he seems to be doing much better in that regard his left lower extremity also is doing better at this point. There does not appear to be any evidence of infection at this time which is great news. Overall I'm very pleased with the progress he seems to be making. 01/30/18 patient has not been seen our office actually since 23 September. At that point following he apparently healed but then had several areas that we opened shortly following. He has new areas in three different spots on evaluation today. This is on the left lower extremity were the ones previously were. With that being said there does not appear to be any evidence of infection at this time which is good news. No fevers, chills, nausea, or vomiting noted at this time. 02/10/18 on evaluation today patient actually appears to be doing excellent in regard to his lower extremities. He just has one wound that appears to still be open at this time. There does not appear to be any evidence of infection currently. With that being said he has been tolerating the compression that we been utilizing without complication. No fevers, chills, nausea, or vomiting noted at this time. 02/24/18 on evaluation today patient's lower extremity ulcer actually shows signs of good improvement at this time. Fortunately there is no evidence of infection. Overall he has some pain but nothing too significant. EL, PILE ([id number]) 03/17/18 on evaluation today patient appears to be doing better in regard to the ulcer on his left lower extremity. This is roughly half the size it was when I saw him last at least. Maybe more. Nonetheless overall he seems to be  doing excellent with the Current wound care measures. 03/31/18 on evaluation today patient actually appears to be doing very well in regard to his lower extremity ulcer. He does have two areas that are leaking on the anterior portion of his lower extremity but no true ulcerations. The ulcer on the lateral portion of his lower extremity actually is doing better although it still is a little bit open at this point. It has a good granulation surface. Readmission 05/18/2018 85 year old male with history of lymphedema, type II diabetes mellitus, long-term use of Eliquis, congestive heart failure, and COPD. He was last seen at wound center December 2019. Presents today with bilateral wounds to his great toes and a wound to the left fifth metatarsal head area. Increased erythema and edema of the left lower leg. Unable to obtain ABIs. He has a small pin tip size area that is draining fluid. He did mention that there has been an increase in his weight up 185 pounds. He was instructed by his cardiologist to take his prescribed metolazone medication with elevated weight parameters >180 pounds. Inquired how often he has had to take metolazone for weight gain and he stated only once over the last couple of days. Encouraged him to follow-up with his cardiologist for heart failure management and weight fluctuation. VSS. Denies recent fever, chills, dizziness, or SOB. No acute distress during exam today. Wound History Patient presents with 3 open wounds that have been present for approximately 1 week. Laboratory tests have not been performed in the last month. Patient reportedly has not tested positive for an antibiotic resistant organism. Patient reportedly has not tested positive for osteomyelitis. Patient reportedly has not had testing performed  to evaluate circulation in the legs. Patient experiences the following problems associated with their wounds: swelling. Patient History Information obtained from  Patient. Allergies niacin, morphine, Iodinated Contrast- Oral and IV Dye Family History Diabetes - Maternal Grandparents, Heart Disease - Paternal Grandparents, Hypertension - Paternal Grandparents, Lung Disease - Mother, No family history of Cancer, Hereditary Spherocytosis, Kidney Disease, Seizures, Stroke, Thyroid Problems, Tuberculosis. Social History Former smoker - quit in 1993, Marital Status - Married, Alcohol Use - Never, Drug Use - No History, Caffeine Use - Daily. Medical History Eyes Denies history of Cataracts, Glaucoma, Optic Neuritis Ear/Nose/Mouth/Throat Denies history of Chronic sinus problems/congestion, Middle ear problems Hematologic/Lymphatic Denies history of Anemia, Hemophilia, Human Immunodeficiency Virus, Lymphedema, Sickle Cell Disease Respiratory Patient has history of Chronic Obstructive Pulmonary Disease (COPD), Sleep Apnea - CPAP Denies history of Aspiration, Asthma, Pneumothorax, Tuberculosis Cardiovascular Patient has history of Arrhythmia - a fib, Congestive Heart Failure, Hypertension, Myocardial Infarction - 1997 Denies history of Angina, Coronary Artery Disease, Deep Vein Thrombosis, Hypotension, Peripheral Arterial Disease, Peripheral Venous Disease, Phlebitis, Vasculitis Gastrointestinal Denies history of Cirrhosis , Colitis, Crohn s, Hepatitis A, Hepatitis B, Hepatitis C Endocrine Patient has history of Type II Diabetes Lauderback, Sharon T. ([id number]) Denies history of Type I Diabetes Genitourinary Denies history of End Stage Renal Disease Immunological Denies history of Lupus Erythematosus, Raynaud s, Scleroderma Integumentary (Skin) Denies history of History of Burn, History of pressure wounds Musculoskeletal Patient has history of Osteoarthritis Denies history of Gout, Rheumatoid Arthritis, Osteomyelitis Neurologic Patient has history of Neuropathy Denies history of Dementia, Quadriplegia, Paraplegia, Seizure  Disorder Oncologic Patient has history of Received Radiation - 5 treatments in chest Denies history of Received Chemotherapy Psychiatric Denies history of Anorexia/bulimia, Confinement Anxiety Medical And Surgical History Notes Neurologic CVA in 1999 Review of Systems (ROS) Eyes Complains or has symptoms of Glasses / Contacts - glasses. Denies complaints or symptoms of Dry Eyes, Vision Changes. Ear/Nose/Mouth/Throat Denies complaints or symptoms of Difficult clearing ears, Sinusitis. Hematologic/Lymphatic Denies complaints or symptoms of Bleeding / Clotting Disorders, Human Immunodeficiency Virus. Respiratory Denies complaints or symptoms of Chronic or frequent coughs, Shortness of Breath. Cardiovascular Complains or has symptoms of LE edema. Denies complaints or symptoms of Chest pain. Gastrointestinal Denies complaints or symptoms of Frequent diarrhea, Nausea, Vomiting. Endocrine Denies complaints or symptoms of Hepatitis, Thyroid disease, Polydypsia (Excessive Thirst). Genitourinary Denies complaints or symptoms of Kidney failure/ Dialysis, Incontinence/dribbling. Immunological Denies complaints or symptoms of Hives, Itching. Integumentary (Skin) Denies complaints or symptoms of Wounds, Bleeding or bruising tendency, Breakdown, Swelling. Musculoskeletal Denies complaints or symptoms of Muscle Pain, Muscle Weakness. Neurologic Denies complaints or symptoms of Numbness/parasthesias, Focal/Weakness. Psychiatric Denies complaints or symptoms of Anxiety, Claustrophobia. GOVANNI, PLEMONS ([id number]) Objective Constitutional Patient is mildly obese.Marland Kitchen appears in no distress. Vitals Time Taken: 1:00 PM, Height: 66 in, Source: Stated, Weight: 180 lbs, Source: Stated, BMI: 29, Temperature: 98.3 F, Pulse: 66 bpm, Respiratory Rate: 16 breaths/min, Blood Pressure: 115/52 mmHg. Eyes Conjunctivae clear. No discharge. Ears, Nose, Mouth, and Throat External ears and nose are  within normal limits No lesions present.Marland Kitchen Respiratory Respiratory effort is easy and symmetric bilaterally. Rate is normal at rest and on room air.. Cardiovascular Edema present in left extremity. Gastrointestinal (GI) Abdomen is soft and non-distended without masses or tenderness. Bowel sounds active in all quadrants.. Musculoskeletal Digits and nails without infection, petechiae, ischemia, or inflammatory conditions.Marland Kitchen Psychiatric Judgement and insight intact.. General Notes: Wound exam: Wound to the right and left great toe and left fifth metatarsal head. Wounds appeared  dark in color with hardening surrounding tissue. Wounds would benefit from debriding however unable to debride during this visit due to unobtainable ABIs. Edema and erythema present to the left lower leg with dry flaky skin. Leg is fairly warm to touch. He denies pain or tenderness. Small area of clear drainage to the left leg.Will obtain x-ray of left foot, referred to vein and vascular for arterial studies, and a seven-day course of Keflex. He requested not to be prescribed doxycycline as he stated it Interactive with his medications in the past. Integumentary (Hair, Skin) Wound bilateral LE. Wound #6 status is Open. Original cause of wound was Gradually Appeared. The wound is located on the Right Toe Great. The wound measures 0.4cm length x 0.5cm width x 0.1cm depth; 0.157cm^2 area and 0.016cm^3 volume. The wound is limited to skin breakdown. There is no tunneling or undermining noted. There is a none present amount of drainage noted. The wound margin is flat and intact. There is no granulation within the wound bed. There is a large (67-100%) amount of necrotic tissue within the wound bed including Eschar. The periwound skin appearance exhibited: Dry/Scaly. The periwound skin appearance did not exhibit: Callus, Crepitus, Excoriation, Induration, Rash, Scarring, Maceration, Atrophie Blanche, Cyanosis, Ecchymosis,  Hemosiderin Staining, Mottled, Pallor, Rubor, Erythema. Periwound temperature was noted as No Abnormality. The periwound has tenderness on palpation. Wound #7 status is Open. Original cause of wound was Gradually Appeared. The wound is located on the Left Toe Great. The wound measures 1.2cm length x 1.7cm width x 0.1cm depth; 1.602cm^2 area and 0.16cm^3 volume. The wound is limited to skin breakdown. There is no tunneling or undermining noted. There is a none present amount of drainage noted. The wound margin is flat and intact. There is no granulation within the wound bed. There is a large (67-100%) amount of necrotic tissue within the wound bed including Eschar. The periwound skin appearance exhibited: Cyanosis, Erythema. The periwound skin appearance did not exhibit: Callus, Crepitus, Excoriation, Induration, Rash, Scarring, Dry/Scaly, Maceration, Atrophie Blanche, Ecchymosis, Hemosiderin Staining, Mottled, Pallor, Rubor. The surrounding wound skin color is noted with erythema. Periwound temperature was noted as No Abnormality. The periwound has tenderness on palpation. TAYLON, COOLE T. ([id number]) Wound #8 status is Open. Original cause of wound was Gradually Appeared. The wound is located on the Left,Medial Metatarsal head first. The wound measures 1.4cm length x 1.2cm width x 0.1cm depth; 1.319cm^2 area and 0.132cm^3 volume. The wound is limited to skin breakdown. There is no tunneling or undermining noted. There is a none present amount of drainage noted. The wound margin is flat and intact. There is no granulation within the wound bed. There is a large (67- 100%) amount of necrotic tissue within the wound bed including Eschar. The periwound skin appearance exhibited: Dry/Scaly, Cyanosis, Erythema. The periwound skin appearance did not exhibit: Callus, Crepitus, Excoriation, Induration, Rash, Scarring, Maceration, Atrophie Blanche, Ecchymosis, Hemosiderin Staining, Mottled, Pallor, Rubor.  The surrounding wound skin color is noted with erythema. Periwound temperature was noted as No Abnormality. The periwound has tenderness on palpation. Assessment Active Problems ICD-10 Lymphedema, not elsewhere classified Non-pressure chronic ulcer of other part of left lower leg with fat layer exposed Type 2 diabetes mellitus with other skin ulcer Long term (current) use of anticoagulants Chronic combined systolic (congestive) and diastolic (congestive) heart failure Chronic obstructive pulmonary disease, unspecified Cellulitis of left lower limb Non-pressure chronic ulcer of other part of right lower leg with other specified severity Plan Wound Cleansing: Wound #6 Right Toe  Great: Cleanse wound with mild soap and water Wound #7 Left Toe Great: Cleanse wound with mild soap and water Wound #8 Left,Medial Metatarsal head first: Cleanse wound with mild soap and water Primary Wound Dressing: Wound #6 Right Toe Great: Silver Alginate - Apply Betadine to Left First Met Head Wound #7 Left Toe Great: Silver Alginate - Apply Betadine to Left First Met Head Wound #8 Left,Medial Metatarsal head first: Silver Alginate - Apply Betadine to Left First Met Head Secondary Dressing: Wound #6 Right Toe Great: Dry Gauze Conform/Kerlix Wound #7 Left Toe Great: Dry Gauze Conform/Kerlix Wound #8 Left,Medial Metatarsal head first: Dry Gauze Righi, Browning T. ([id number]) Conform/Kerlix Dressing Change Frequency: Wound #6 Right Toe Great: Change dressing every other day. Wound #7 Left Toe Great: Change dressing every other day. Wound #8 Left,Medial Metatarsal head first: Change dressing every other day. Follow-up Appointments: Wound #6 Right Toe Great: Return Appointment in 1 week. Wound #7 Left Toe Great: Return Appointment in 1 week. Wound #8 Left,Medial Metatarsal head first: Return Appointment in 1 week. Off-Loading: Wound #7 Left Toe Great: Open toe surgical shoe to: - Both  feet Wound #8 Left,Medial Metatarsal head first: Open toe surgical shoe to: - Both feet Medications-please add to medication list.: Wound #6 Right Toe Great: P.O. Antibiotics - Finish antibiotic Wound #7 Left Toe Great: P.O. Antibiotics - Finish antibiotic Wound #8 Left,Medial Metatarsal head first: P.O. Antibiotics - Finish antibiotic Radiology ordered were: X-ray, foot - Left Foot to include Left Great Toe and First Met Head, 2-view Electronic Signature(s) Signed: 05/20/2018 6:43:56 PM By: Beather Arbour FNP-C Entered By: Beather Arbour on 05/20/2018 18:40:36 Markov, Gavin [name] ([id number]) -------------------------------------------------------------------------------- ROS/PFSH Details Patient Name: Shawn Bark T. Date of Service: 05/18/2018 1:00 PM Medical Record Number: [id number] Patient Account Number: [id number] Date of Birth/Sex: [birth year] (85 y.o. M) Treating RN: Montey Hora Primary Care Provider: Webb Silversmith Other Clinician: Referring Provider: Webb Silversmith Treating Provider/Extender: Oneida Arenas in Treatment: 0 Information Obtained From Patient Wound History Do you currently have one or more open woundso Yes How many open wounds do you currently haveo 3 Approximately how long have you had your woundso 1 week Has your wound(s) ever healed and then re-openedo No Have you had any lab work done in the past montho No Have you tested positive for an antibiotic resistant organism (MRSA, VRE)o No Have you tested positive for osteomyelitis (bone infection)o No Have you had any tests for circulation on your legso No Have you had other problems associated with your woundso Swelling Eyes Complaints and Symptoms: Positive for: Glasses / Contacts - glasses Negative for: Dry Eyes; Vision Changes Medical History: Negative for: Cataracts; Glaucoma; Optic Neuritis Ear/Nose/Mouth/Throat Complaints and Symptoms: Negative for: Difficult clearing ears;  Sinusitis Medical History: Negative for: Chronic sinus problems/congestion; Middle ear problems Hematologic/Lymphatic Complaints and Symptoms: Negative for: Bleeding / Clotting Disorders; Human Immunodeficiency Virus Medical History: Negative for: Anemia; Hemophilia; Human Immunodeficiency Virus; Lymphedema; Sickle Cell Disease Respiratory Complaints and Symptoms: Negative for: Chronic or frequent coughs; Shortness of Breath Medical History: Positive for: Chronic Obstructive Pulmonary Disease (COPD); Sleep Apnea - CPAP Negative for: Aspiration; Asthma; Pneumothorax; Tuberculosis Cardiovascular MAXAMILIAN, AMADON. ([id number]) Complaints and Symptoms: Positive for: LE edema Negative for: Chest pain Medical History: Positive for: Arrhythmia - a fib; Congestive Heart Failure; Hypertension; Myocardial Infarction - 1997 Negative for: Angina; Coronary Artery Disease; Deep Vein Thrombosis; Hypotension; Peripheral Arterial Disease; Peripheral Venous Disease; Phlebitis; Vasculitis Gastrointestinal Complaints and Symptoms: Negative for: Frequent diarrhea; Nausea; Vomiting Medical History:  Negative for: Cirrhosis ; Colitis; Crohnos; Hepatitis A; Hepatitis B; Hepatitis C Endocrine Complaints and Symptoms: Negative for: Hepatitis; Thyroid disease; Polydypsia (Excessive Thirst) Medical History: Positive for: Type II Diabetes Negative for: Type I Diabetes Treated with: Insulin, Oral agents Blood sugar tested every day: Yes Tested : QD Genitourinary Complaints and Symptoms: Negative for: Kidney failure/ Dialysis; Incontinence/dribbling Medical History: Negative for: End Stage Renal Disease Immunological Complaints and Symptoms: Negative for: Hives; Itching Medical History: Negative for: Lupus Erythematosus; Raynaudos; Scleroderma Integumentary (Skin) Complaints and Symptoms: Negative for: Wounds; Bleeding or bruising tendency; Breakdown; Swelling Medical History: Negative for:  History of Burn; History of pressure wounds Musculoskeletal Complaints and Symptoms: Negative for: Muscle Pain; Muscle Weakness Medical History: Positive for: Osteoarthritis GERRAD, WELKER T. ([id number]) Negative for: Gout; Rheumatoid Arthritis; Osteomyelitis Neurologic Complaints and Symptoms: Negative for: Numbness/parasthesias; Focal/Weakness Medical History: Positive for: Neuropathy Negative for: Dementia; Quadriplegia; Paraplegia; Seizure Disorder Past Medical History Notes: CVA in 1999 Psychiatric Complaints and Symptoms: Negative for: Anxiety; Claustrophobia Medical History: Negative for: Anorexia/bulimia; Confinement Anxiety Oncologic Medical History: Positive for: Received Radiation - 5 treatments in chest Negative for: Received Chemotherapy Immunizations Pneumococcal Vaccine: Received Pneumococcal Vaccination: Yes Implantable Devices Family and Social History Cancer: No; Diabetes: Yes - Maternal Grandparents; Heart Disease: Yes - Paternal Grandparents; Hereditary Spherocytosis: No; Hypertension: Yes - Paternal Grandparents; Kidney Disease: No; Lung Disease: Yes - Mother; Seizures: No; Stroke: No; Thyroid Problems: No; Tuberculosis: No; Former smoker - quit in 1993; Marital Status - Married; Alcohol Use: Never; Drug Use: No History; Caffeine Use: Daily; Financial Concerns: No; Food, Clothing or Shelter Needs: No; Support System Lacking: No; Transportation Concerns: No; Advanced Directives: No; Patient does not want information on Advanced Directives Electronic Signature(s) Signed: 05/18/2018 4:19:21 PM By: Montey Hora Signed: 05/20/2018 6:43:56 PM By: Beather Arbour FNP-C Entered By: Montey Hora on 05/18/2018 13:05:56 Merlo, Gavin [name] ([id number]) -------------------------------------------------------------------------------- SuperBill Details Patient Name: Shawn Bark T. Date of Service: 05/18/2018 Medical Record Number: [id number] Patient Account  Number: [id number] Date of Birth/Sex: October 10, 1931 (85 y.o. M) Treating RN: Harold Barban Primary Care Provider: Webb Silversmith Other Clinician: Referring Provider: Webb Silversmith Treating Provider/Extender: Oneida Arenas in Treatment: 0 Diagnosis Coding ICD-10 Codes Code Description I89.0 Lymphedema, not elsewhere classified L97.822 Non-pressure chronic ulcer of other part of left lower leg with fat layer exposed E11.622 Type 2 diabetes mellitus with other skin ulcer Z79.01 Long term (current) use of anticoagulants I50.42 Chronic combined systolic (congestive) and diastolic (congestive) heart failure J44.9 Chronic obstructive pulmonary disease, unspecified L03.116 Cellulitis of left lower limb L97.818 Non-pressure chronic ulcer of other part of right lower leg with other specified severity Facility Procedures CPT4 Code: [id number] Description: 99214 - WOUND CARE VISIT-LEV 4 EST PT Modifier: Quantity: 1 Physician Procedures CPT4 Code Description: [id number] - WC PHYS LEVEL 3 - EST PT ICD-10 Diagnosis Description L03.116 Cellulitis of left lower limb L97.822 Non-pressure chronic ulcer of other part of left lower leg with L97.818 Non-pressure chronic ulcer of other part of  right lower leg wit I50.42 Chronic combined systolic (congestive) and diastolic (congestiv Modifier: fat layer exposed h other specified e) heart failure Quantity: 1 severity Electronic Signature(s) Signed: 05/20/2018 6:43:56 PM By: Beather Arbour FNP-C Entered By: Beather Arbour on 05/20/2018 18:41:39

## 2018-06-19 NOTE — Telephone Encounter (Signed)
This was addressed by Dr. Diona Browner over the weekend.

## 2018-06-19 NOTE — Progress Notes (Signed)
[name], [name] ([id number]) Visit Report for 05/18/2018 Allergy List Details Patient Name: [name], [name]. Date of Service: 05/18/2018 1:00 PM Medical Record Number: [id number] Patient Account Number: [id number] Date of Birth/Sex: Sep 01, 1931 (85 y.o. M) Treating RN: Montey Hora Primary Care Tramar Brueckner: Webb Silversmith Other Clinician: Referring Koah Chisenhall: Webb Silversmith Treating Boston Cookson/Extender: Beather Arbour Weeks in Treatment: 0 Allergies Active Allergies niacin morphine Iodinated Contrast- Oral and IV Dye Allergy Notes Electronic Signature(s) Signed: 05/18/2018 4:19:21 PM By: Montey Hora Entered By: Montey Hora on 05/18/2018 13:04:15 [name] ([id number]) -------------------------------------------------------------------------------- Arrival Information Details Patient Name: Shawn Bark T. Date of Service: 05/18/2018 1:00 PM Medical Record Number: [id number] Patient Account Number: [id number] Date of Birth/Sex: 10/17/1931 (85 y.o. M) Treating RN: Harold Barban Primary Care Meekah Math: Webb Silversmith Other Clinician: Referring Korion Cuevas: Webb Silversmith Treating Nyasiah Moffet/Extender: Oneida Arenas in Treatment: 0 Visit Information Patient Arrived: Gilford Rile Arrival Time: 12:59 Accompanied By: son Transfer Assistance: None Patient Identification Verified: Yes Secondary Verification Process Yes Completed: Patient Has Alerts: Yes Patient Alerts: Patient on Blood Thinner Eliquis DMII History Since Last Visit Added or deleted any medications: No Any new allergies or adverse reactions: No Had a fall or experienced change in activities of daily living that may affect risk of falls: No Signs or symptoms of abuse/neglect since last visito No Hospitalized since last visit: Yes Implantable device outside of the clinic excluding cellular tissue based products placed in the center since last visit: No Has Dressing in Place as Prescribed: Yes Electronic  Signature(s) Signed: 05/18/2018 4:19:21 PM By: Montey Hora Entered By: Montey Hora on 05/18/2018 13:21:32 [name] ([id number]) -------------------------------------------------------------------------------- Clinic Level of Care Assessment Details Patient Name: Shawn Bark T. Date of Service: 05/18/2018 1:00 PM Medical Record Number: [id number] Patient Account Number: [id number] Date of Birth/Sex: [birth year] (85 y.o. M) Treating RN: Harold Barban Primary Care Angle Karel: Webb Silversmith Other Clinician: Referring Juanangel Soderholm: Webb Silversmith Treating Shakyla Nolley/Extender: Oneida Arenas in Treatment: 0 Clinic Level of Care Assessment Items TOOL 4 Quantity Score []  - Use when only an EandM is performed on FOLLOW-UP visit 0 ASSESSMENTS - Nursing Assessment / Reassessment X - Reassessment of Co-morbidities (includes updates in patient status) 1 10 X- 1 5 Reassessment of Adherence to Treatment Plan ASSESSMENTS - Wound and Skin Assessment / Reassessment []  - Simple Wound Assessment / Reassessment - one wound 0 X- 3 5 Complex Wound Assessment / Reassessment - multiple wounds []  - 0 Dermatologic / Skin Assessment (not related to wound area) ASSESSMENTS - Focused Assessment []  - Circumferential Edema Measurements - multi extremities 0 []  - 0 Nutritional Assessment / Counseling / Intervention []  - 0 Lower Extremity Assessment (monofilament, tuning fork, pulses) []  - 0 Peripheral Arterial Disease Assessment (using hand held doppler) ASSESSMENTS - Ostomy and/or Continence Assessment and Care []  - Incontinence Assessment and Management 0 []  - 0 Ostomy Care Assessment and Management (repouching, etc.) PROCESS - Coordination of Care X - Simple Patient / Family Education for ongoing care 1 15 []  - 0 Complex (extensive) Patient / Family Education for ongoing care X- 1 10 Staff obtains Programmer, systems, Records, Test Results / Process Orders []  - 0 Staff telephones HHA, Nursing Homes  / Clarify orders / etc []  - 0 Routine Transfer to another Facility (non-emergent condition) []  - 0 Routine Hospital Admission (non-emergent condition) []  - 0 New Admissions / Biomedical engineer / Ordering NPWT, Apligraf, etc. []  - 0 Emergency Hospital Admission (emergent condition) X- 1 10 Simple Discharge Coordination SHIVA, SAHAGIAN T. ([id number]) []  - 0  Complex (extensive) Discharge Coordination PROCESS - Special Needs []  - Pediatric / Minor Patient Management 0 []  - 0 Isolation Patient Management []  - 0 Hearing / Language / Visual special needs []  - 0 Assessment of Community assistance (transportation, D/C planning, etc.) []  - 0 Additional assistance / Altered mentation []  - 0 Support Surface(s) Assessment (bed, cushion, seat, etc.) INTERVENTIONS - Wound Cleansing / Measurement []  - Simple Wound Cleansing - one wound 0 X- 3 5 Complex Wound Cleansing - multiple wounds X- 1 5 Wound Imaging (photographs - any number of wounds) []  - 0 Wound Tracing (instead of photographs) []  - 0 Simple Wound Measurement - one wound X- 3 5 Complex Wound Measurement - multiple wounds INTERVENTIONS - Wound Dressings []  - Small Wound Dressing one or multiple wounds 0 X- 3 15 Medium Wound Dressing one or multiple wounds []  - 0 Large Wound Dressing one or multiple wounds []  - 0 Application of Medications - topical []  - 0 Application of Medications - injection INTERVENTIONS - Miscellaneous []  - External ear exam 0 []  - 0 Specimen Collection (cultures, biopsies, blood, body fluids, etc.) []  - 0 Specimen(s) / Culture(s) sent or taken to Lab for analysis []  - 0 Patient Transfer (multiple staff / Civil Service fast streamer / Similar devices) []  - 0 Simple Staple / Suture removal (25 or less) []  - 0 Complex Staple / Suture removal (26 or more) []  - 0 Hypo / Hyperglycemic Management (close monitor of Blood Glucose) []  - 0 Ankle / Brachial Index (ABI) - do not check if billed separately X- 1  5 Vital Signs [name], [name] ([id number]) Has the patient been seen at the hospital within the last three years: Yes Total Score: 150 Level Of Care: New/Established - Level 4 Electronic Signature(s) Signed: 06/19/2018 10:54:06 AM By: Harold Barban Entered By: Harold Barban on 05/18/2018 13:40:29 [name] ([id number]) -------------------------------------------------------------------------------- Encounter Discharge Information Details Patient Name: Shawn Bark T. Date of Service: 05/18/2018 1:00 PM Medical Record Number: [id number] Patient Account Number: [id number] Date of Birth/Sex: 1931/11/08 (85 y.o. M) Treating RN: Cornell Barman Primary Care Maison Kestenbaum: Webb Silversmith Other Clinician: Referring Maryrose Colvin: Webb Silversmith Treating Eathon Valade/Extender: Oneida Arenas in Treatment: 0 Encounter Discharge Information Items Discharge Condition: Stable Ambulatory Status: Walker Discharge Destination: Home Transportation: Private Auto Accompanied By: son Schedule Follow-up Appointment: Yes Clinical Summary of Care: Electronic Signature(s) Signed: 05/18/2018 4:54:30 PM By: Gretta Cool, BSN, RN, CWS, Kim RN, BSN Entered By: Gretta Cool, BSN, RN, CWS, Kim on 05/18/2018 14:09:46 [name] ([id number]) -------------------------------------------------------------------------------- Lower Extremity Assessment Details Patient Name: NATALIE, LECLAIRE T. Date of Service: 05/18/2018 1:00 PM Medical Record Number: [id number] Patient Account Number: [id number] Date of Birth/Sex: 03-12-1932 (85 y.o. M) Treating RN: Montey Hora Primary Care Calisa Luckenbaugh: Webb Silversmith Other Clinician: Referring Chatara Lucente: Webb Silversmith Treating Hung Rhinesmith/Extender: Beather Arbour Weeks in Treatment: 0 Edema Assessment Assessed: [Left: No] [Right: No] Edema: [Left: Ye] [Right: s] Calf Left: Right: Point of Measurement: 30 cm From Medial Instep 36.2 cm 37 cm Ankle Left: Right: Point of Measurement: 12 cm  From Medial Instep 25.4 cm 25.4 cm Vascular Assessment Claudication: Claudication Assessment [Left:None] [Right:None] Pulses: Dorsalis Pedis Palpable: [Left:No] [Right:No] Doppler Audible: [Left:Inaudible] [Right:Yes] Posterior Tibial Palpable: [Left:No] [Right:No] Doppler Audible: [Left:Inaudible] [Right:Yes] Extremity colors, hair growth, and conditions: Extremity Color: [Left:Red] [Right:Normal] Hair Growth on Extremity: [Left:No] [Right:No] Temperature of Extremity: [Left:Cool] [Right:Cool] Capillary Refill: [Left:< 3 seconds] [Right:< 3 seconds] Toe Nail Assessment Left: Right: Thick: Yes Yes Discolored: Yes Yes Deformed: Yes Yes Improper Length and Hygiene:  No No Notes ABI RIGHT  >220; ABI LEFT - PULSES INAUDIBLE Electronic Signature(s) Signed: 05/18/2018 4:19:21 PM By: Montey Hora Entered By: Montey Hora on 05/18/2018 13:22:22 BASEL, DEFALCO ([id number]) Rider, Gavin Pound ([id number]) -------------------------------------------------------------------------------- Multi Wound Chart Details Patient Name: Shawn Bark T. Date of Service: 05/18/2018 1:00 PM Medical Record Number: [id number] Patient Account Number: [id number] Date of Birth/Sex: 19-Nov-1931 (85 y.o. M) Treating RN: Harold Barban Primary Care Chaysen Tillman: Webb Silversmith Other Clinician: Referring Josiah Wojtaszek: Webb Silversmith Treating Braian Tijerina/Extender: Beather Arbour Weeks in Treatment: 0 Vital Signs Height(in): 71 Pulse(bpm): 50 Weight(lbs): 180 Blood Pressure(mmHg): 115/52 Body Mass Index(BMI): 29 Temperature(F): 98.3 Respiratory Rate 16 (breaths/min): Photos: [6:No Photos] [7:No Photos] [8:No Photos] Wound Location: [6:Right Toe Great] [7:Left Toe Great] [8:Left Metatarsal head first - Medial] Wounding Event: [6:Gradually Appeared] [7:Gradually Appeared] [8:Gradually Appeared] 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] [7:Arterial Insufficiency Ulcer] [8:Arterial Insufficiency  Ulcer] Comorbid History: [6:Chronic Obstructive Pulmonary Disease (COPD), Sleep Apnea, Arrhythmia, Congestive Heart Failure, Hypertension, Myocardial Infarction, Type II Diabetes, Osteoarthritis, Neuropathy, Received Radiation] [7:Chronic Obstructive Pulmonary  Disease (COPD), Sleep Apnea, Arrhythmia, Congestive Heart Failure, Hypertension, Myocardial Infarction, Type II Diabetes, Osteoarthritis, Neuropathy, Received Radiation] [8:Chronic Obstructive Pulmonary Disease (COPD), Sleep Apnea, Arrhythmia, Congestive  Heart Failure, Hypertension, Myocardial Infarction, Type II Diabetes, Osteoarthritis, Neuropathy, Received Radiation] Date Acquired: [6:05/03/2018] [7:05/03/2018] [8:05/03/2018] Weeks of Treatment: [6:0] [7:0] [8:0] Wound Status: [6:Open] [7:Open] [8:Open] Pending Amputation on [6:Yes] [7:Yes] [8:Yes] Presentation: Measurements L x W x D [6:0.4x0.5x0.1] [7:1.2x1.7x0.1] [8:1.4x1.2x0.1] (cm) Area (cm) : [6:0.157] [7:1.602] [8:1.319] Volume (cm) : [6:0.016] [7:0.16] [8:0.132] % Reduction in Area: [6:0.00%] [7:N/A] [8:N/A] % Reduction in Volume: [6:0.00%] [7:N/A] [8:N/A] Classification: [6:Unclassifiable] [7:Full Thickness Without Exposed Support Structures] [8:Full Thickness Without Exposed Support Structures] Exudate Amount: [6:None Present] [7:None Present] [8:None Present] Wound Margin: [6:Flat and Intact] [7:Flat and Intact] [8:Flat and Intact] Granulation Amount: [6:None Present (0%)] [7:None Present (0%)] [8:None Present (0%)] Necrotic Amount: [6:Large (67-100%)] [7:Large (67-100%)] [8:Large (67-100%)] Necrotic Tissue: [6:Eschar] [7:Eschar] [8:Eschar] Exposed Structures: [6:Fascia: No Fat Layer (Subcutaneous Tissue) Exposed: No Tendon: No Muscle: No Joint: No] [7:Fascia: No Fat Layer (Subcutaneous Tissue) Exposed: No Tendon: No Muscle: No Joint: No] [8:Fascia: No Fat Layer (Subcutaneous Tissue) Exposed: No Tendon: No  Muscle: No Joint: No] Bone: No Bone: No Bone: No Limited to  Skin Breakdown Limited to Skin Breakdown Limited to Skin Breakdown Epithelialization: None None None Periwound Skin Texture: Excoriation: No Excoriation: No Excoriation: No Induration: No Induration: No Induration: No Callus: No Callus: No Callus: No Crepitus: No Crepitus: No Crepitus: No Rash: No Rash: No Rash: No Scarring: No Scarring: No Scarring: No Periwound Skin Moisture: Dry/Scaly: Yes Maceration: No Dry/Scaly: Yes Maceration: No Dry/Scaly: No Maceration: No Periwound Skin Color: Atrophie Blanche: No Cyanosis: Yes Cyanosis: Yes Cyanosis: No Erythema: Yes Erythema: Yes Ecchymosis: No Atrophie Blanche: No Atrophie Blanche: No Erythema: No Ecchymosis: No Ecchymosis: No Hemosiderin Staining: No Hemosiderin Staining: No Hemosiderin Staining: No Mottled: No Mottled: No Mottled: No Pallor: No Pallor: No Pallor: No Rubor: No Rubor: No Rubor: No Temperature: No Abnormality No Abnormality No Abnormality Tenderness on Palpation: Yes Yes Yes Wound Preparation: Ulcer Cleansing: Ulcer Cleansing: Ulcer Cleansing: Rinsed/Irrigated with Saline Rinsed/Irrigated with Saline Rinsed/Irrigated with Saline Topical Anesthetic Applied: Topical Anesthetic Applied: Topical Anesthetic Applied: Other: lidocaine 4% Other: lidocaine 4% Other: lidocaine 4% Treatment Notes Electronic Signature(s) Signed: 06/19/2018 10:54:06 AM By: Harold Barban Entered By: Harold Barban on 05/18/2018 13:37:01 [name] ([id number]) -------------------------------------------------------------------------------- [place] Details Patient Name: Shawn Bark T. Date of Service: 05/18/2018 1:00 PM  Medical Record Number: [id number] Patient Account Number: [id number] Date of Birth/Sex: 1931/06/28 (86 y.o. M) Treating RN: Harold Barban Primary Care Yarden Hillis: Webb Silversmith Other Clinician: Referring Curby Carswell: Webb Silversmith Treating Yarelie Hams/Extender: Beather Arbour Weeks in Treatment: 0 Active Inactive Wound/Skin Impairment Nursing Diagnoses: Impaired tissue integrity Knowledge deficit related to ulceration/compromised skin integrity Goals: Ulcer/skin breakdown will have a volume reduction of 30% by week 4 Date Initiated: 05/18/2018 Target Resolution Date: 06/16/2018 Goal Status: Active Interventions: Assess patient/caregiver ability to obtain necessary supplies Assess patient/caregiver ability to perform ulcer/skin care regimen upon admission and as needed Assess ulceration(s) every visit Notes: Electronic Signature(s) Signed: 06/19/2018 10:54:06 AM By: Harold Barban Entered By: Harold Barban on 05/18/2018 13:36:45 Mihalik, Gavin Pound ([id number]) -------------------------------------------------------------------------------- Pain Assessment Details Patient Name: Shawn Bark T. Date of Service: 05/18/2018 1:00 PM Medical Record Number: [id number] Patient Account Number: [id number] Date of Birth/Sex: 06-07-1931 (84 y.o. M) Treating RN: Harold Barban Primary Care Jerene Yeager: Webb Silversmith Other Clinician: Referring Cadyn Fann: Webb Silversmith Treating Keilany Burnette/Extender: Oneida Arenas in Treatment: 0 Active Problems Location of Pain Severity and Description of Pain Patient Has Paino Yes Site Locations Rate the pain. Current Pain Level: 4 Pain Management and Medication Current Pain Management: Electronic Signature(s) Signed: 05/18/2018 4:29:20 PM By: Lorine Bears RCP, RRT, CHT Signed: 06/19/2018 10:54:06 AM By: Harold Barban Entered By: Lorine Bears on 05/18/2018 13:00:19 [name] ([id number]) -------------------------------------------------------------------------------- Patient/Caregiver Education Details Patient Name: Shawn Bark T. Date of Service: 05/18/2018 1:00 PM Medical Record Number: [id number] Patient Account Number: [id number] Date of Birth/Gender: March 21, 1932 (85 y.o.  M) Treating RN: Harold Barban Primary Care Physician: Webb Silversmith Other Clinician: Referring Physician: Webb Silversmith Treating Physician/Extender: Oneida Arenas in Treatment: 0 Education Assessment Education Provided To: Patient Education Topics Provided Wound/Skin Impairment: Handouts: Caring for Your Ulcer Methods: Demonstration, Explain/Verbal Responses: State content correctly Electronic Signature(s) Signed: 06/19/2018 10:54:06 AM By: Harold Barban Entered By: Harold Barban on 05/18/2018 13:37:15 Stanback, Gavin Pound ([id number]) -------------------------------------------------------------------------------- Wound Assessment Details Patient Name: Shawn Bark T. Date of Service: 05/18/2018 1:00 PM Medical Record Number: [id number] Patient Account Number: [id number] Date of Birth/Sex: 28-Jan-1932 (85 y.o. M) Treating RN: Montey Hora Primary Care Marisel Tostenson: Webb Silversmith Other Clinician: Referring Cannie Muckle: Webb Silversmith Treating Malai Lady/Extender: Beather Arbour Weeks in Treatment: 0 Wound Status Wound Number: 6 Primary Arterial Insufficiency Ulcer Etiology: Wound Location: Right Toe Great Wound Open Wounding Event: Gradually Appeared Status: Date Acquired: 05/03/2018 Comorbid Chronic Obstructive Pulmonary Disease (COPD), Weeks Of Treatment: 0 History: Sleep Apnea, Arrhythmia, Congestive Heart Clustered Wound: No Failure, Hypertension, Myocardial Infarction, Type Pending Amputation On Presentation II Diabetes, Osteoarthritis, Neuropathy, Received Radiation Photos Photo Uploaded By: Gretta Cool, BSN, RN, CWS, Kim on 05/18/2018 16:54:02 Wound Measurements Length: (cm) 0.4 % Reducti Width: (cm) 0.5 % Reducti Depth: (cm) 0.1 Epithelia Area: (cm) 0.157 Tunnelin Volume: (cm) 0.016 Undermin on in Area: 0% on in Volume: 0% lization: None g: No ing: No Wound Description Classification: Unclassifiable Foul Odor Wound Margin: Flat and Intact  Slough/Fi Exudate Amount: None Present After Cleansing: No brino No Wound Bed Granulation Amount: None Present (0%) Exposed Structure Necrotic Amount: Large (67-100%) Fascia Exposed: No Necrotic Quality: Eschar Fat Layer (Subcutaneous Tissue) Exposed: No Tendon Exposed: No Muscle Exposed: No Joint Exposed: No Bone Exposed: No Limited to Skin Breakdown Coiro, Percival T. ([id number]) Periwound Skin Texture Texture Color No Abnormalities Noted: No No Abnormalities Noted: No Callus: No Atrophie Blanche: No Crepitus: No Cyanosis: No Excoriation: No Ecchymosis: No Induration: No Erythema: No Rash: No Hemosiderin Staining: No  Scarring: No Mottled: No Pallor: No Moisture Rubor: No No Abnormalities Noted: No Dry / Scaly: Yes Temperature / Pain Maceration: No Temperature: No Abnormality Tenderness on Palpation: Yes Wound Preparation Ulcer Cleansing: Rinsed/Irrigated with Saline Topical Anesthetic Applied: Other: lidocaine 4%, Electronic Signature(s) Signed: 05/18/2018 4:19:21 PM By: Montey Hora Entered By: Montey Hora on 05/18/2018 13:12:57 Burgoon, Gavin Pound ([id number]) -------------------------------------------------------------------------------- Wound Assessment Details Patient Name: Shawn Bark T. Date of Service: 05/18/2018 1:00 PM Medical Record Number: [id number] Patient Account Number: [id number] Date of Birth/Sex: 07-15-1931 (85 y.o. M) Treating RN: Montey Hora Primary Care Kairo Laubacher: Webb Silversmith Other Clinician: Referring Marney Treloar: Webb Silversmith Treating Kagen Kunath/Extender: Beather Arbour Weeks in Treatment: 0 Wound Status Wound Number: 7 Primary Arterial Insufficiency Ulcer Etiology: Wound Location: Left Toe Great Wound Open Wounding Event: Gradually Appeared Status: Date Acquired: 05/03/2018 Comorbid Chronic Obstructive Pulmonary Disease (COPD), Weeks Of Treatment: 0 History: Sleep Apnea, Arrhythmia, Congestive Heart Clustered Wound:  No Failure, Hypertension, Myocardial Infarction, Type Pending Amputation On Presentation II Diabetes, Osteoarthritis, Neuropathy, Received Radiation Photos Photo Uploaded By: Gretta Cool, BSN, RN, CWS, Kim on 05/18/2018 16:54:03 Wound Measurements Length: (cm) 1.2 % Reducti Width: (cm) 1.7 % Reducti Depth: (cm) 0.1 Epithelia Area: (cm) 1.602 Tunnelin Volume: (cm) 0.16 Undermin on in Area: on in Volume: lization: None g: No ing: No Wound Description Full Thickness Without Exposed Support Foul Odor Classification: Structures Slough/Fi Wound Margin: Flat and Intact Exudate None Present Amount: After Cleansing: No brino No Wound Bed Granulation Amount: None Present (0%) Exposed Structure Necrotic Amount: Large (67-100%) Fascia Exposed: No Necrotic Quality: Eschar Fat Layer (Subcutaneous Tissue) Exposed: No Tendon Exposed: No Muscle Exposed: No Joint Exposed: No Bone Exposed: No Kirley, Rafik T. ([id number]) Limited to Skin Breakdown Periwound Skin Texture Texture Color No Abnormalities Noted: No No Abnormalities Noted: No Callus: No Atrophie Blanche: No Crepitus: No Cyanosis: Yes Excoriation: No Ecchymosis: No Induration: No Erythema: Yes Rash: No Hemosiderin Staining: No Scarring: No Mottled: No Pallor: No Moisture Rubor: No No Abnormalities Noted: No Dry / Scaly: No Temperature / Pain Maceration: No Temperature: No Abnormality Tenderness on Palpation: Yes Wound Preparation Ulcer Cleansing: Rinsed/Irrigated with Saline Topical Anesthetic Applied: Other: lidocaine 4%, Electronic Signature(s) Signed: 05/18/2018 4:19:21 PM By: Montey Hora Entered By: Montey Hora on 05/18/2018 13:12:46 Denker, Gavin Pound ([id number]) -------------------------------------------------------------------------------- Wound Assessment Details Patient Name: Shawn Bark T. Date of Service: 05/18/2018 1:00 PM Medical Record Number: [id number] Patient Account Number:  [id number] Date of Birth/Sex: Jul 01, 1931 (85 y.o. M) Treating RN: Montey Hora Primary Care Katheryn Culliton: Webb Silversmith Other Clinician: Referring Maxamillian Tienda: Webb Silversmith Treating Mikinzie Maciejewski/Extender: Beather Arbour Weeks in Treatment: 0 Wound Status Wound Number: 8 Primary Arterial Insufficiency Ulcer Etiology: Wound Location: Left Metatarsal head first - Medial Wound Open Wounding Event: Gradually Appeared Status: Date Acquired: 05/03/2018 Comorbid Chronic Obstructive Pulmonary Disease (COPD), Weeks Of Treatment: 0 History: Sleep Apnea, Arrhythmia, Congestive Heart Clustered Wound: No Failure, Hypertension, Myocardial Infarction, Type Pending Amputation On Presentation II Diabetes, Osteoarthritis, Neuropathy, Received Radiation Photos Photo Uploaded By: Gretta Cool, BSN, RN, CWS, Kim on 05/18/2018 16:54:21 Wound Measurements Length: (cm) 1.4 Width: (cm) 1.2 Depth: (cm) 0.1 Area: (cm) 1.319 Volume: (cm) 0.132 % Reduction in Area: % Reduction in Volume: Epithelialization: None Tunneling: No Undermining: No Wound Description Full Thickness Without Exposed Support Foul O Classification: Structures Slough Wound Margin: Flat and Intact Exudate None Present Amount: dor After Cleansing: No /Fibrino No Wound Bed Granulation Amount: None Present (0%) Exposed Structure Necrotic Amount: Large (67-100%) Fascia Exposed: No Necrotic Quality: Eschar Fat Layer (Subcutaneous Tissue)  Exposed: No Tendon Exposed: No Muscle Exposed: No Joint Exposed: No Bone Exposed: No Gunner, Bluford T. ([id number]) Limited to Skin Breakdown Periwound Skin Texture Texture Color No Abnormalities Noted: No No Abnormalities Noted: No Callus: No Atrophie Blanche: No Crepitus: No Cyanosis: Yes Excoriation: No Ecchymosis: No Induration: No Erythema: Yes Rash: No Hemosiderin Staining: No Scarring: No Mottled: No Pallor: No Moisture Rubor: No No Abnormalities Noted: No Dry / Scaly: Yes  Temperature / Pain Maceration: No Temperature: No Abnormality Tenderness on Palpation: Yes Wound Preparation Ulcer Cleansing: Rinsed/Irrigated with Saline Topical Anesthetic Applied: Other: lidocaine 4%, Electronic Signature(s) Signed: 05/18/2018 4:19:21 PM By: Montey Hora Entered By: Montey Hora on 05/18/2018 13:14:47 Colaizzi, Gavin Pound ([id number]) -------------------------------------------------------------------------------- Vitals Details Patient Name: Shawn Bark T. Date of Service: 05/18/2018 1:00 PM Medical Record Number: [id number] Patient Account Number: [id number] Date of Birth/Sex: [birth year] (85 y.o. M) Treating RN: Harold Barban Primary Care Cele Mote: Webb Silversmith Other Clinician: Referring Keen Ewalt: Webb Silversmith Treating Allyna Pittsley/Extender: Oneida Arenas in Treatment: 0 Vital Signs Time Taken: 13:00 Temperature (F): 98.3 Height (in): 66 Pulse (bpm): 66 Source: Stated Respiratory Rate (breaths/min): 16 Weight (lbs): 180 Blood Pressure (mmHg): 115/52 Source: Stated Reference Range: 80 - 120 mg / dl Body Mass Index (BMI): 29 Electronic Signature(s) Signed: 05/18/2018 4:29:20 PM By: Lorine Bears RCP, RRT, CHT Entered By: Lorine Bears on 05/18/2018 13:03:00

## 2018-06-22 ENCOUNTER — Inpatient Hospital Stay: Payer: Medicare Other | Admitting: Internal Medicine

## 2018-06-27 ENCOUNTER — Telehealth: Payer: Self-pay | Admitting: Internal Medicine

## 2018-06-27 ENCOUNTER — Other Ambulatory Visit: Payer: Self-pay | Admitting: Internal Medicine

## 2018-06-27 NOTE — Telephone Encounter (Signed)
Tiffany Dunn Social worker @ [facility] called to schedule pt follow up with you pt is being discharged today and needs appointment with 10-15 days.  Is it ok to schedule  Best number [phone]  Jonelle Sidle would like a call back to schedule

## 2018-06-27 NOTE — Telephone Encounter (Signed)
If he is stable, lets make it for 2 weeks out. He is a sick fellow, and we are trying to limit his potential exposure at this time.

## 2018-06-28 ENCOUNTER — Telehealth: Payer: Self-pay

## 2018-06-28 NOTE — Telephone Encounter (Signed)
Left message asking [name]  to call office

## 2018-06-28 NOTE — Telephone Encounter (Signed)
Olivia Mackie nurse with [facility] left v/m; pt was discharged from [facility] on 06/27/18 after amputation of lt great toe; request verbal orders for [facility] nursing 2 x a wk for 4 wks and 1 x a wk for 4 wks due to high risk for incisional breakdown and pt has decubitus on lt heel. Olivia Mackie also needs supervising physician name.

## 2018-06-29 ENCOUNTER — Other Ambulatory Visit: Payer: Self-pay

## 2018-06-29 MED ORDER — TRAMADOL HCL 50 MG PO TABS: 50.0000 mg | ORAL_TABLET | Freq: Two times a day (BID) | ORAL | 0 refills | Status: DC

## 2018-06-29 NOTE — Telephone Encounter (Signed)
Left message asking [name] to call the office

## 2018-06-29 NOTE — Telephone Encounter (Signed)
Left message asking tiffany to call the office

## 2018-06-29 NOTE — Telephone Encounter (Signed)
Last filled 03/23/2018 #180, did get a short supply while in the hospital last... please advise

## 2018-06-29 NOTE — Telephone Encounter (Signed)
Ok for [facility] nursing as request. [name]

## 2018-06-30 NOTE — Telephone Encounter (Signed)
[name] canceled appointment

## 2018-07-05 NOTE — Telephone Encounter (Signed)
VO given.

## 2018-07-06 ENCOUNTER — Other Ambulatory Visit: Payer: Self-pay | Admitting: Internal Medicine

## 2018-07-06 ENCOUNTER — Telehealth: Payer: Self-pay

## 2018-07-06 DIAGNOSIS — M109 Gout, unspecified: Secondary | ICD-10-CM

## 2018-07-06 NOTE — Telephone Encounter (Signed)
noted 

## 2018-07-06 NOTE — Telephone Encounter (Signed)
[name] from [facility] PT said that pt advised him on 06/30/18 that he was not allowed weight bearing on feet for 2 wks. The [facility] nurse will continue to visit pt and when pt can resume HH PT the pt will let the [facility] nurse know. FYI to Avie Echevaria NP.

## 2018-07-16 ENCOUNTER — Other Ambulatory Visit: Payer: Self-pay | Admitting: Internal Medicine

## 2018-07-17 ENCOUNTER — Encounter (INDEPENDENT_AMBULATORY_CARE_PROVIDER_SITE_OTHER): Payer: Medicare Other

## 2018-07-17 ENCOUNTER — Ambulatory Visit (INDEPENDENT_AMBULATORY_CARE_PROVIDER_SITE_OTHER): Payer: Medicare Other | Admitting: Nurse Practitioner

## 2018-07-17 ENCOUNTER — Other Ambulatory Visit: Payer: Self-pay | Admitting: Internal Medicine

## 2018-07-18 ENCOUNTER — Other Ambulatory Visit: Payer: Self-pay

## 2018-07-18 DIAGNOSIS — F419 Anxiety disorder, unspecified: Secondary | ICD-10-CM

## 2018-07-18 NOTE — Telephone Encounter (Signed)
Last filled 05/08/2018 #90 mail order... looks like pt was prescribed #10 from hosp Dr on 06/15/2018... please advise

## 2018-07-19 MED ORDER — ALPRAZOLAM 0.25 MG PO TABS: 0.2500 mg | ORAL_TABLET | Freq: Every evening | ORAL | 0 refills | Status: DC | PRN

## 2018-07-30 ENCOUNTER — Other Ambulatory Visit: Payer: Self-pay | Admitting: Internal Medicine

## 2018-07-30 NOTE — Telephone Encounter (Signed)
Need an update on how his sugars are running

## 2018-07-30 NOTE — Telephone Encounter (Signed)
This medication is showing it was discontinued by you...Marland Kitchen please advise

## 2018-08-11 ENCOUNTER — Emergency Department
Admission: EM | Admit: 2018-08-11 | Discharge: 2018-08-11 | Disposition: A | Discharge status: Home / Self Care | Payer: Medicare Other | Attending: Emergency Medicine | Admitting: Emergency Medicine

## 2018-08-11 ENCOUNTER — Emergency Department: Payer: Medicare Other

## 2018-08-11 ENCOUNTER — Other Ambulatory Visit: Payer: Self-pay

## 2018-08-11 ENCOUNTER — Encounter: Payer: Self-pay | Admitting: Emergency Medicine

## 2018-08-11 DIAGNOSIS — I110 Hypertensive heart disease with heart failure: Secondary | ICD-10-CM | POA: Diagnosis not present

## 2018-08-11 DIAGNOSIS — E876 Hypokalemia: Secondary | ICD-10-CM | POA: Diagnosis not present

## 2018-08-11 DIAGNOSIS — W010XXA Fall on same level from slipping, tripping and stumbling without subsequent striking against object, initial encounter: Secondary | ICD-10-CM | POA: Insufficient documentation

## 2018-08-11 DIAGNOSIS — R42 Dizziness and giddiness: Secondary | ICD-10-CM | POA: Diagnosis not present

## 2018-08-11 DIAGNOSIS — Z8673 Personal history of transient ischemic attack (TIA), and cerebral infarction without residual deficits: Secondary | ICD-10-CM | POA: Diagnosis not present

## 2018-08-11 DIAGNOSIS — I2510 Atherosclerotic heart disease of native coronary artery without angina pectoris: Secondary | ICD-10-CM | POA: Insufficient documentation

## 2018-08-11 DIAGNOSIS — Y92003 Bedroom of unspecified non-institutional (private) residence as the place of occurrence of the external cause: Secondary | ICD-10-CM | POA: Diagnosis not present

## 2018-08-11 DIAGNOSIS — W19XXXA Unspecified fall, initial encounter: Secondary | ICD-10-CM

## 2018-08-11 DIAGNOSIS — S51011A Laceration without foreign body of right elbow, initial encounter: Secondary | ICD-10-CM

## 2018-08-11 DIAGNOSIS — Z794 Long term (current) use of insulin: Secondary | ICD-10-CM | POA: Insufficient documentation

## 2018-08-11 DIAGNOSIS — Z79899 Other long term (current) drug therapy: Secondary | ICD-10-CM | POA: Diagnosis not present

## 2018-08-11 DIAGNOSIS — Z7982 Long term (current) use of aspirin: Secondary | ICD-10-CM | POA: Diagnosis not present

## 2018-08-11 DIAGNOSIS — Y999 Unspecified external cause status: Secondary | ICD-10-CM | POA: Diagnosis not present

## 2018-08-11 DIAGNOSIS — I5032 Chronic diastolic (congestive) heart failure: Secondary | ICD-10-CM | POA: Insufficient documentation

## 2018-08-11 DIAGNOSIS — F17200 Nicotine dependence, unspecified, uncomplicated: Secondary | ICD-10-CM | POA: Diagnosis not present

## 2018-08-11 DIAGNOSIS — E119 Type 2 diabetes mellitus without complications: Secondary | ICD-10-CM | POA: Insufficient documentation

## 2018-08-11 DIAGNOSIS — Z791 Long term (current) use of non-steroidal anti-inflammatories (NSAID): Secondary | ICD-10-CM | POA: Diagnosis not present

## 2018-08-11 DIAGNOSIS — I252 Old myocardial infarction: Secondary | ICD-10-CM | POA: Diagnosis not present

## 2018-08-11 DIAGNOSIS — E039 Hypothyroidism, unspecified: Secondary | ICD-10-CM | POA: Diagnosis not present

## 2018-08-11 DIAGNOSIS — Y9301 Activity, walking, marching and hiking: Secondary | ICD-10-CM | POA: Insufficient documentation

## 2018-08-11 DIAGNOSIS — S59911A Unspecified injury of right forearm, initial encounter: Secondary | ICD-10-CM | POA: Diagnosis present

## 2018-08-11 DIAGNOSIS — J449 Chronic obstructive pulmonary disease, unspecified: Secondary | ICD-10-CM | POA: Diagnosis not present

## 2018-08-11 DIAGNOSIS — N390 Urinary tract infection, site not specified: Secondary | ICD-10-CM | POA: Diagnosis not present

## 2018-08-11 LAB — CBC WITH DIFFERENTIAL/PLATELET
Abs Immature Granulocytes: 0.2 10*3/uL — ABNORMAL HIGH (ref 0.00–0.07)
Basophils Absolute: 0 10*3/uL (ref 0.0–0.1)
Basophils Relative: 0 %
Eosinophils Absolute: 0.2 10*3/uL (ref 0.0–0.5)
Eosinophils Relative: 1 %
HCT: 41 % (ref 39.0–52.0)
Hemoglobin: 13.2 g/dL (ref 13.0–17.0)
Immature Granulocytes: 1 %
Lymphocytes Relative: 23 %
Lymphs Abs: 3.3 10*3/uL (ref 0.7–4.0)
MCH: 28.9 pg (ref 26.0–34.0)
MCHC: 32.2 g/dL (ref 30.0–36.0)
MCV: 89.9 fL (ref 80.0–100.0)
Monocytes Absolute: 1.5 10*3/uL — ABNORMAL HIGH (ref 0.1–1.0)
Monocytes Relative: 11 %
Neutro Abs: 9 10*3/uL — ABNORMAL HIGH (ref 1.7–7.7)
Neutrophils Relative %: 64 %
Platelets: 193 10*3/uL (ref 150–400)
RBC: 4.56 MIL/uL (ref 4.22–5.81)
RDW: 16.2 % — ABNORMAL HIGH (ref 11.5–15.5)
WBC: 14.3 10*3/uL — ABNORMAL HIGH (ref 4.0–10.5)
nRBC: 0 % (ref 0.0–0.2)

## 2018-08-11 LAB — URINALYSIS, COMPLETE (UACMP) WITH MICROSCOPIC
Bacteria, UA: NONE SEEN
Bilirubin Urine: NEGATIVE
Glucose, UA: NEGATIVE mg/dL
Hgb urine dipstick: NEGATIVE
Ketones, ur: NEGATIVE mg/dL
Nitrite: NEGATIVE
Protein, ur: NEGATIVE mg/dL
Specific Gravity, Urine: 1.006 (ref 1.005–1.030)
pH: 5 (ref 5.0–8.0)

## 2018-08-11 LAB — COMPREHENSIVE METABOLIC PANEL
ALT: 14 U/L (ref 0–44)
AST: 13 U/L — ABNORMAL LOW (ref 15–41)
Albumin: 3.2 g/dL — ABNORMAL LOW (ref 3.5–5.0)
Alkaline Phosphatase: 68 U/L (ref 38–126)
Anion gap: 9 (ref 5–15)
BUN: 35 mg/dL — ABNORMAL HIGH (ref 8–23)
CO2: 29 mmol/L (ref 22–32)
Calcium: 9 mg/dL (ref 8.9–10.3)
Chloride: 102 mmol/L (ref 98–111)
Creatinine, Ser: 1.59 mg/dL — ABNORMAL HIGH (ref 0.61–1.24)
GFR calc Af Amer: 46 mL/min — ABNORMAL LOW (ref >60)
GFR calc non Af Amer: 39 mL/min — ABNORMAL LOW (ref >60)
Glucose, Bld: 96 mg/dL (ref 70–99)
Potassium: 3.1 mmol/L — ABNORMAL LOW (ref 3.5–5.1)
Sodium: 140 mmol/L (ref 135–145)
Total Bilirubin: 0.7 mg/dL (ref 0.3–1.2)
Total Protein: 6.5 g/dL (ref 6.5–8.1)

## 2018-08-11 LAB — BRAIN NATRIURETIC PEPTIDE: B Natriuretic Peptide: 379 pg/mL — ABNORMAL HIGH (ref 0.0–100.0)

## 2018-08-11 LAB — TROPONIN I: Troponin I: <0.03 ng/mL (ref <0.03)

## 2018-08-11 MED ORDER — CEPHALEXIN 500 MG PO CAPS: 500.0000 mg | ORAL_CAPSULE | Freq: Two times a day (BID) | ORAL | 0 refills | Status: AC

## 2018-08-11 MED ORDER — POTASSIUM CHLORIDE CRYS ER 20 MEQ PO TBCR
40.0000 meq | EXTENDED_RELEASE_TABLET | Freq: Once | ORAL | Status: AC
  Administered 2018-08-11: 40 meq via ORAL
  Filled 2018-08-11: qty 2

## 2018-08-11 NOTE — ED Provider Notes (Signed)
[facility] Emergency Department Provider Note   ____________________________________________   First MD Initiated Contact with Patient 08/11/18 [phone]     (approximate)  I have reviewed the triage vital signs and the nursing notes.   HISTORY  Chief Complaint Fall    HPI [name]. is a 85 y.o. male who presents to the ED from home status post fall with right shoulder pain and right forearm skin tear.  Patient has a history of atrial fibrillation, CHF on Eliquis.  Often has "inner ear" with dizziness.  Got up to use the restroom.  Thought he waited long enough sitting on the edge of the bed and began to ambulate with his walker when he fell, striking his right head and forearm.  Denies LOC.  Denies vision changes, neck pain, headache, fever, cough, chest pain, shortness of breath, abdominal pain, nausea or vomiting.  Tetanus is up-to-date.  Denies recent travel or exposure to persons diagnosed with coronavirus.       Past Medical History:  Diagnosis Date   Arthritis    CAD    a. MI 01/29/1996 tx'd w/ TPA @ [place]; b. Myoview 06/2005: EF 50%, scar @ apex, mild peri-infarct ischemia   Cancer ([place])    skin   Chronic atrial fibrillation    a. since 2006; b. on warfarin   Chronic diastolic CHF (congestive heart failure) ([place])    a. echo 04/2006: EF lower limits of nl, mod LVH, mild aortic root dilatation, & mild MR, biatrial enlargement; b. echo 04/2013: EF 60%, mod dilated LA, mild MR & TR, mod pulm HTN w/ RV systolic pressure 53, c. echo 04/21/14: EF 55-60%, unable to exclude WMA, severely dilated LA 6.6 cm, nl RVSP, mildly dilated aortic root   COPD (chronic obstructive pulmonary disease) (HCC)    oxygen prn at home   CVA [phone]   x2   DM    Falls    GERD (gastroesophageal reflux disease)    History of kidney stones    HYPERLIPIDEMIA    HYPERTENSION    Kidney stone    a. s/p left ureteral stenting 04/24/14   Left arm weakness      limited movement. S/P fall injury   Neuropathy of both feet    Poor balance    Wears dentures    full upper and lower    Patient Active Problem List   Diagnosis Date Noted   Diabetic infection of left foot ([place]) 06/09/2018   Rash and nonspecific skin eruption 11/18/2017   Acquired hypothyroidism 11/22/2016   Osteoarthritis, knee 11/22/2016   Diabetes type 2, controlled ([place]) 06/13/2015   BPH (benign prostatic hyperplasia) 05/14/2014   Anxiety 05/14/2014   Chronic obstructive pulmonary disease ([place]) 05/14/2014   Chronic diastolic CHF (congestive heart failure) ([place])    Insomnia 08/07/2013   Hyperlipidemia 02/26/2009   MITRAL REGURGITATION 02/26/2009   Essential hypertension 02/26/2009   MI 02/26/2009   Coronary artery disease of native artery of native heart with stable angina pectoris ([place]) 02/26/2009   Chronic atrial fibrillation 02/26/2009   CVA (cerebral vascular accident) ([place]) 02/26/2009    Past Surgical History:  Procedure Laterality Date   AMPUTATION TOE Left 06/13/2018   Procedure: 1st Ray Resection Left;  Surgeon: Sharlotte Alamo, DPM;  Location: [facility];  Service: Podiatry;  Laterality: Left;   BLADDER SURGERY     stent placement    [place]  CATARACT EXTRACTION W/PHACO Left 10/11/2017   Procedure: CATARACT EXTRACTION PHACO AND INTRAOCULAR LENS PLACEMENT ([place]) COMPLICATED LEFT DIABETIC;  Surgeon: Leandrew Koyanagi, MD;  Location: [place];  Service: Ophthalmology;  Laterality: Left;  MALYUGIN Diabetic - insulin   CATARACT EXTRACTION W/PHACO Right 11/30/2017   Procedure: CATARACT EXTRACTION PHACO AND INTRAOCULAR LENS PLACEMENT ([place]) COMPLICATED  RIGHT DIABETIC;  Surgeon: Leandrew Koyanagi, MD;  Location: [place];  Service: Ophthalmology;  Laterality: Right;  Diabetic - insulin   CIRCUMCISION  2016   CORONARY ANGIOPLASTY  1997   s/p stent placement x 2     CYSTOSCOPY W/ URETERAL STENT REMOVAL Left 10/09/2014   Procedure: CYSTOSCOPY WITH STENT REMOVAL;  Surgeon: Hollice Espy, MD;  Location: [facility];  Service: Urology;  Laterality: Left;   CYSTOSCOPY WITH STENT PLACEMENT Left 10/09/2014   Procedure: CYSTOSCOPY WITH STENT PLACEMENT;  Surgeon: Hollice Espy, MD;  Location: [facility];  Service: Urology;  Laterality: Left;   KIDNEY SURGERY  05/2013   s/p stent placement    LOWER EXTREMITY ANGIOGRAPHY Left 06/12/2018   Procedure: Lower Extremity Angiography;  Surgeon: Algernon Huxley, MD;  Location: [place] [facility];  Service: Cardiovascular;  Laterality: Left;   stents ureters Bilateral    TONSILLECTOMY AND ADENOIDECTOMY  1959   URETEROSCOPY WITH HOLMIUM LASER LITHOTRIPSY Left 10/09/2014   Procedure: URETEROSCOPY WITH HOLMIUM LASER LITHOTRIPSY;  Surgeon: Hollice Espy, MD;  Location: [facility];  Service: Urology;  Laterality: Left;    Prior to Admission medications   Medication Sig Start Date End Date Taking? Authorizing Provider  acetaminophen (TYLENOL) 650 MG CR tablet Take 650 mg by mouth every 8 (eight) hours as needed for pain.     [provider]  albuterol (PROAIR HFA) 108 (90 Base) MCG/ACT inhaler USE 1 TO 2 INHALATIONS EVERY 4 HOURS AS NEEDED Patient taking differently: Inhale 1-2 puffs into the lungs every 4 (four) hours as needed for wheezing or shortness of breath.  11/09/17   Wilhelmina Mcardle, MD  ALPRAZolam Duanne Moron) 0.25 MG tablet Take 1 tablet (0.25 mg total) by mouth at bedtime as needed for anxiety. 07/19/18   Jearld Fenton, NP  apixaban (ELIQUIS) 2.5 MG TABS tablet Take 1 tablet (2.5 mg total) by mouth 2 (two) times daily. 03/27/18   Minna Merritts, MD  aspirin EC 81 MG EC tablet Take 1 tablet (81 mg total) by mouth daily. 06/16/18   Fritzi Mandes, MD  atorvastatin (LIPITOR) 40 MG tablet TAKE 1 TABLET DAILY 07/17/18   Jearld Fenton, NP  citalopram (CELEXA) 10 MG tablet TAKE 1 TABLET DAILY 07/18/18   Jearld Fenton, NP   clotrimazole (LOTRIMIN) 1 % cream Apply 1 application topically 2 (two) times daily. 11/18/17   Bedsole, Amy E, MD  COLCRYS 0.6 MG tablet TAKE 1 TABLET TWICE A DAY AS NEEDED (ACUTE GOUT FLARE) USE AS DIRECTED 07/06/18   Jearld Fenton, NP  diclofenac sodium (VOLTAREN) 1 % GEL Apply 4 g topically 4 (four) times daily. 01/05/18   [place], Frederico Hamman, MD  dorzolamide (TRUSOPT) 2 % ophthalmic solution Place 1 drop into both eyes 3 (three) times daily.  03/31/16   [provider]  ezetimibe (ZETIA) 10 MG tablet TAKE 1 TABLET DAILY 07/17/18   Jearld Fenton, NP  finasteride (PROSCAR) 5 MG tablet TAKE 1 TABLET DAILY Patient taking differently: Take 5 mg by mouth daily.  06/29/17   Jearld Fenton, NP  gabapentin (NEURONTIN) 100 MG capsule TAKE 1 CAPSULE THREE  TIMES A DAY 06/09/18   Jearld Fenton, NP  glipiZIDE (GLUCOTROL) 5 MG tablet TAKE 1 TABLET TWICE A DAY BEFORE MEALS 07/31/18   Jearld Fenton, NP  insulin detemir (LEVEMIR) 100 UNIT/ML injection Inject 0.28 mLs (28 Units total) into the skin daily at 10 pm. 06/15/18   Fritzi Mandes, MD  insulin lispro (HUMALOG) 100 UNIT/ML injection Inject 0.04 mLs (4 Units total) into the skin 3 (three) times daily with meals. 06/15/18   Fritzi Mandes, MD  Insulin Pen Needle (BD PEN NEEDLE NANO U/F) 32G X 4 MM MISC USE THREE TIMES A DAY FOR INSULIN ADMINISTRATION 02/15/18   Jearld Fenton, NP  Insulin Syringe-Needle U-100 30G X 1/2" 1 ML MISC 1 each by Does not apply route 3 (three) times daily. 07/19/14   Jearld Fenton, NP  latanoprost (XALATAN) 0.005 % ophthalmic solution Place 1 drop into both eyes at bedtime.  04/18/16   [provider]  levothyroxine (SYNTHROID, LEVOTHROID) 50 MCG tablet TAKE 1 TABLET DAILY BEFORE BREAKFAST 06/27/18   Jearld Fenton, NP  metolazone (ZAROXOLYN) 5 MG tablet Take 1 tablet (5 mg total) by mouth daily as needed (swelling). 03/13/18   Minna Merritts, MD  metoprolol succinate (TOPROL-XL) 50 MG 24 hr tablet TAKE 1 TABLET TWICE A DAY  06/09/18   Jearld Fenton, NP  montelukast (SINGULAIR) 10 MG tablet TAKE 1 TABLET AT BEDTIME Patient taking differently: Take 10 mg by mouth at bedtime.  02/24/18   Jearld Fenton, NP  nitroGLYCERIN (NITROSTAT) 0.4 MG SL tablet Place 0.4 mg under the tongue every 5 (five) minutes as needed.      [provider]  nystatin-triamcinolone (MYCOLOG II) cream Apply 1 application topically 2 (two) times daily.  03/12/14   [provider]  potassium chloride SA (K-DUR,KLOR-CON) 20 MEQ tablet Take 4 tablets (80 meq) by mouth twice daily, take an extra 1 tablet (20 meq) on the days you take metolazone 02/10/18   Minna Merritts, MD  predniSONE (DELTASONE) 10 MG tablet Take 1 tablet (10 mg total) by mouth daily with breakfast. 05/05/18   Jearld Fenton, NP  SYMBICORT 160-4.5 MCG/ACT inhaler USE 2 INHALATIONS TWICE A DAY Patient taking differently: Inhale 1 puff into the lungs 2 (two) times daily.  08/31/17   Wilhelmina Mcardle, MD  tamsulosin (FLOMAX) 0.4 MG CAPS capsule TAKE 1 CAPSULE DAILY 07/18/18   Jearld Fenton, NP  torsemide (DEMADEX) 20 MG tablet Take 40 mg by mouth 2 (two) times daily.    [provider]  traMADol (ULTRAM) 50 MG tablet Take 1 tablet (50 mg total) by mouth 2 (two) times daily. 06/29/18   Jearld Fenton, NP  traZODone (DESYREL) 50 MG tablet Take 2 tablets (100 mg total) by mouth at bedtime as needed for sleep. 02/16/18   Jearld Fenton, NP  umeclidinium bromide (INCRUSE ELLIPTA) 62.5 MCG/INH AEPB Inhale 1 puff into the lungs daily.    [provider]    Allergies Contrast media [iodinated diagnostic agents]; Morphine and related; Niacin and related; Other; and Amlodipine  Family History  Problem Relation Age of Onset   Heart disease Mother    Heart disease Maternal Grandmother    Diabetes Maternal Grandmother    Cancer Neg Hx    Stroke Neg Hx     Social History Social History   Tobacco Use   Smoking status: Former Smoker     Packs/day: 2.00    Years: 40.00  Pack years: 80.00    Types: Cigarettes    Last attempt to quit: 05/24/1990    Years since quitting: 28.2   Smokeless tobacco: Former Systems developer    Quit date: 05/24/1990  Substance Use Topics   Alcohol use: No   Drug use: No    Review of Systems  Constitutional: No fever/chills Eyes: No visual changes. ENT: No sore throat. Cardiovascular: Denies chest pain. Respiratory: Denies shortness of breath. Gastrointestinal: No abdominal pain.  No nausea, no vomiting.  No diarrhea.  No constipation. Genitourinary: Negative for dysuria. Musculoskeletal: Positive for right forearm skin tear and right shoulder pain.  Negative for back pain. Skin: Negative for rash. Neurological: Positive for dizziness.  Negative for headaches, focal weakness or numbness.   ____________________________________________   PHYSICAL EXAM:  VITAL SIGNS: ED Triage Vitals [08/11/18 0446]  Enc Vitals Group     BP      Pulse      Resp      Temp      Temp src      SpO2      Weight 189 lb (85.7 kg)     Height 5\' 6"  (1.676 m)     Head Circumference      Peak Flow      Pain Score 8     Pain Loc      Pain Edu?      Excl. in [place]?     Constitutional: Alert and oriented. Well appearing and in mild acute distress. Eyes: Conjunctivae are normal. PERRL. EOMI. Head: Atraumatic. Nose: Atraumatic. Mouth/Throat: Mucous membranes are moist.  No dental malocclusion. Neck: No stridor.  No cervical spine tenderness to palpation. Cardiovascular: Normal rate, irregular rhythm. Grossly normal heart sounds.  Good peripheral circulation. Respiratory: Normal respiratory effort.  No retractions. Lungs CTAB. Gastrointestinal: Soft and nontender. No distention. No abdominal bruits. No CVA tenderness. Musculoskeletal: Skin tear to right posterior shoulder.  Shoulder tender to palpation and limited range of motion secondary to pain.  Large right forearm skin tear with missing skin.  Smaller right  forearm skin tear.  No active bleeding with any wound.  2+ radial pulse.  Brisk, less than 5-second capillary refill.  Pelvis stable.  No lower extremity tenderness nor edema.  No joint effusions. Neurologic: Alert and oriented x3.  CN II-XII grossly intact.  Normal speech and language. No gross focal neurologic deficits are appreciated.  Skin:  Skin is warm, dry and intact. No rash noted. Psychiatric: Mood and affect are normal. Speech and behavior are normal.  ____________________________________________   LABS (all labs ordered are listed, but only abnormal results are displayed)  Labs Reviewed  CBC WITH DIFFERENTIAL/PLATELET - Abnormal; Notable for the following components:      Result Value   WBC 14.3 (*)    RDW 16.2 (*)    Neutro Abs 9.0 (*)    Monocytes Absolute 1.5 (*)    Abs Immature Granulocytes 0.20 (*)    All other components within normal limits  COMPREHENSIVE METABOLIC PANEL - Abnormal; Notable for the following components:   Potassium 3.1 (*)    BUN 35 (*)    Creatinine, Ser 1.59 (*)    Albumin 3.2 (*)    AST 13 (*)    GFR calc non Af Amer 39 (*)    GFR calc Af Amer 46 (*)    All other components within normal limits  BRAIN NATRIURETIC PEPTIDE - Abnormal; Notable for the following components:   B Natriuretic Peptide 379.0 (*)  All other components within normal limits  TROPONIN I  URINALYSIS, COMPLETE (UACMP) WITH MICROSCOPIC   ____________________________________________  EKG  ED ECG REPORT I, Kamaya Keckler J, the attending physician, personally viewed and interpreted this ECG.   Date: 08/11/2018  EKG Time: 0458  Rate: 69  Rhythm: atrial fibrillation, rate 69  Axis: Normal  Intervals:none  ST&T Change: Nonspecific  ____________________________________________  RADIOLOGY  ED MD interpretation: No ICH, no fracture/dislocation of right shoulder, stable chest x-ray  Official radiology report(s): Dg Shoulder Right  Result Date: 08/11/2018 CLINICAL  DATA:  Fall with right shoulder pain EXAM: RIGHT SHOULDER - 2+ VIEW COMPARISON:  None available FINDINGS: High humeral head contacting the remodeled acromioclavicular joint. No fracture or dislocation. IMPRESSION: 1. No acute finding. 2. Findings of chronic rotator cuff tear. Electronically Signed   By: Monte Fantasia M.D.   On: 08/11/2018 05:54   Ct Head Wo Contrast  Result Date: 08/11/2018 CLINICAL DATA:  Dizziness and fall EXAM: CT HEAD WITHOUT CONTRAST TECHNIQUE: Contiguous axial images were obtained from the base of the skull through the vertex without intravenous contrast. COMPARISON:  10/10/2016 FINDINGS: Brain: No evidence of acute infarction, hemorrhage, hydrocephalus, extra-axial collection or mass lesion/mass effect. Remote right occipital infarct. Generalized atrophy with small vessel ischemic changes in the white matter and deep gray nuclei. Vascular: Atherosclerotic calcification.  No hyperdense vessel Skull: Negative for fracture Sinuses/Orbits: No evidence of injury. Bilateral cataract resection. IMPRESSION: 1. No acute finding. 2. Chronic small vessel ischemia and remote right occipital infarct. 3. Generalized atrophy. Electronically Signed   By: Monte Fantasia M.D.   On: 08/11/2018 05:37   Dg Chest Port 1 View  Result Date: 08/11/2018 CLINICAL DATA:  Dizziness and fall. EXAM: PORTABLE CHEST 1 VIEW COMPARISON:  04/17/2018 FINDINGS: Chronic cardiomegaly and vascular pedicle widening. Diffuse interstitial coarsening correlating with interstitial lung disease on 2019 chest CT. Interstitial thickening has improved from most recent comparison; no convincing superimposed edema. No effusion or air leak. IMPRESSION: Chronic interstitial lung disease. No convincing acute superimposed finding. Electronically Signed   By: Monte Fantasia M.D.   On: 08/11/2018 05:53    ____________________________________________   PROCEDURES  Procedure(s) performed (including Critical  Care):  Procedures  CRITICAL CARE Performed by: Paulette Blanch   Total critical care time: 30 minutes  Critical care time was exclusive of separately billable procedures and treating other patients.  Critical care was necessary to treat or prevent imminent or life-threatening deterioration.  Critical care was time spent personally by me on the following activities: development of treatment plan with patient and/or surrogate as well as nursing, discussions with consultants, evaluation of patient's response to treatment, examination of patient, obtaining history from patient or surrogate, ordering and performing treatments and interventions, ordering and review of laboratory studies, ordering and review of radiographic studies, pulse oximetry and re-evaluation of patient's condition. ____________________________________________   INITIAL IMPRESSION / ASSESSMENT AND PLAN / ED COURSE  As part of my medical decision making, I reviewed the following data within the [place] notes reviewed and incorporated, Labs reviewed, EKG interpreted, Old chart reviewed, Radiograph reviewed and Notes from prior ED visits     [name]. was evaluated in Emergency Department on 08/11/2018 for the symptoms described in the history of present illness. He was evaluated in the context of the global COVID-19 pandemic, which necessitated consideration that the patient might be at risk for infection with the SARS-CoV-2 virus that causes COVID-19. Institutional protocols and algorithms that pertain to  the evaluation of patients at risk for COVID-19 are in a state of rapid change based on information released by regulatory bodies including the CDC and federal and state organizations. These policies and algorithms were followed during the patient's care in the ED.   85 year old male who presents status post fall secondary to dizziness which he states he has often.  On Eliquis for atrial  fibrillation.  Presents with right shoulder pain and forearm skin tear.  Differential diagnosis includes but is not limited to BPV, ICH, CVA, ACS, infectious, metabolic etiologies, etc.  Will obtain screening lab work, CT head, image shoulder and chest.  Skin tear is not amenable to sutures given missing chunk of skin as well as friable and thin skin.  Will cleanse and apply wound care.  If patient is discharged, will refer him to the wound care clinic for follow-up.   Clinical Course as of Aug 10 656  Fri Aug 11, 2018  0656 Updated patient on laboratory results. Patient attempted to use the urinal and missed. Will try again for urine specimen. Updated him of imaging results.  Anticipate patient will be able to be discharged home.  Will have him follow-up with wound care center for the skin tear on his right arm.  Replete potassium orally now.  At this time care is transferred to Dr. Cinda Quest at change of shift.   [JS]    Clinical Course User Index [JS] Paulette Blanch, MD     ____________________________________________   FINAL CLINICAL IMPRESSION(S) / ED DIAGNOSES  Final diagnoses:  Fall, initial encounter  Skin tear of right elbow without complication, initial encounter  Dizziness  Hypokalemia     ED Discharge Orders    None       Note:  This document was prepared using Dragon voice recognition software and may include unintentional dictation errors.   Paulette Blanch, MD 08/11/18 [phone]

## 2018-08-11 NOTE — ED Triage Notes (Addendum)
Pt to triage via w/c with no distress noted; reports PTA got up to BR, became dizzy and fell hitting nightstand; st hit head and rt shoulder; denies LOC and reports skin tear to right arm as well; family member waiting outside [phone])

## 2018-08-11 NOTE — ED Notes (Signed)
Pt alert and oriented X4, active, cooperative, pt in NAD. RR even and unlabored, color WNL.  Pt informed to return if any life threatening symptoms occur.  Discharge and followup instructions reviewed. Left with all of belongings. Son took patient home, reviewed discharge and prescriptions with both.

## 2018-08-11 NOTE — Discharge Instructions (Addendum)
Please make an appointment to be seen at the wound care center for the skin tear on your right forearm.  Return to the ER for worsening symptoms, persistent vomiting, difficulty breathing, lethargy or other concerns.  Take the Keflex 1 pill 2 times a day for what appears to be a urinary tract infection.  Please follow-up with your regular doctor in 2 or 3 days to see how you are doing and to check on your UTI

## 2018-08-11 NOTE — ED Notes (Signed)
Skin tear to right arm, shoulder and right abdomen cleansed with sterile NS, and dressed with nonstick dressings. No continued bleeding at this time. Given orange juice, pericare provided, linens changed and TV turned on. Denies further needs at this time. Pt aware that we need urine sample-previous sample in urine was spilled in bed prior to this RN taking assignment.

## 2018-08-12 ENCOUNTER — Encounter: Payer: Self-pay | Admitting: Internal Medicine

## 2018-08-16 ENCOUNTER — Encounter: Payer: Self-pay | Admitting: Internal Medicine

## 2018-08-16 ENCOUNTER — Ambulatory Visit (INDEPENDENT_AMBULATORY_CARE_PROVIDER_SITE_OTHER): Payer: Medicare Other | Admitting: Internal Medicine

## 2018-08-16 VITALS — BP 130/68 | HR 84 | Temp 98.3°F | Wt 184.5 lb

## 2018-08-16 DIAGNOSIS — M79601 Pain in right arm: Secondary | ICD-10-CM

## 2018-08-16 DIAGNOSIS — S41119A Laceration without foreign body of unspecified upper arm, initial encounter: Secondary | ICD-10-CM | POA: Diagnosis not present

## 2018-08-16 DIAGNOSIS — R42 Dizziness and giddiness: Secondary | ICD-10-CM | POA: Diagnosis not present

## 2018-08-16 DIAGNOSIS — Y92009 Unspecified place in unspecified non-institutional (private) residence as the place of occurrence of the external cause: Secondary | ICD-10-CM

## 2018-08-16 DIAGNOSIS — W19XXXD Unspecified fall, subsequent encounter: Secondary | ICD-10-CM

## 2018-08-16 NOTE — Progress Notes (Signed)
Virtual Visit via Video Note  I connected with [name]. on 08/16/18 at  2:00 PM EDT by a video enabled telemedicine application and verified that I am speaking with the correct person using two identifiers.  Location: Patient: Home Provider: Office   I discussed the limitations of evaluation and management by telemedicine and the availability of in person appointments. The patient expressed understanding and agreed to proceed.  History of Present Illness:  Pt due for ER follow up. Went to the ER 5/1 s/p fall. C/o right shoulder pain, skin tear to right forearm. Creatinine mildly elevated, potassium marginally low. ECG unchanged. Xray of right shoulder negative for acute fracture. CT head did not show any acute findings. Chest xray did not show any acute findings. His skin tears was cleaned and dressed. He was discharged home with home health for dressing changes. Since discharge, he reports he seems to be recovering well. Home health is coming out 1 day a week for dressing changes, but he needs them more often. He has not been able to get a caregiver to change his dressing other times that are needed. PT will be coming out to do an eval as well as social work. He reports his pain is controlled with Prednisone, Tylenol and Tramadol.    Past Medical History:  Diagnosis Date  . Arthritis   . CAD    a. MI 01/29/1996 tx'd w/ TPA @ [facility]; b. Myoview 06/2005: EF 50%, scar @ apex, mild peri-infarct ischemia  . Cancer ([place])    skin  . Chronic atrial fibrillation    a. since 2006; b. on warfarin  . Chronic diastolic CHF (congestive heart failure) ([facility])    a. echo 04/2006: EF lower limits of nl, mod LVH, mild aortic root dilatation, & mild MR, biatrial enlargement; b. echo 04/2013: EF 60%, mod dilated LA, mild MR & TR, mod pulm HTN w/ RV systolic pressure 53, c. echo 04/21/14: EF 55-60%, unable to exclude WMA, severely dilated LA 6.6 cm, nl RVSP, mildly dilated aortic root  . COPD (chronic  obstructive pulmonary disease) ([facility])    oxygen prn at home  . CVA [id number]   x2  . DM   . Falls   . GERD (gastroesophageal reflux disease)   . History of kidney stones   . HYPERLIPIDEMIA   . HYPERTENSION   . Kidney stone    a. s/p left ureteral stenting 04/24/14  . Left arm weakness    limited movement. S/P fall injury  . Neuropathy of both feet   . Poor balance   . Wears dentures    full upper and lower    Current Outpatient Medications  Medication Sig Dispense Refill  . acetaminophen (TYLENOL) 650 MG CR tablet Take 650 mg by mouth every 8 (eight) hours as needed for pain.     Marland Kitchen albuterol (PROAIR HFA) 108 (90 Base) MCG/ACT inhaler USE 1 TO 2 INHALATIONS EVERY 4 HOURS AS NEEDED (Patient taking differently: Inhale 1-2 puffs into the lungs every 4 (four) hours as needed for wheezing or shortness of breath. ) 25.5 g 3  . ALPRAZolam (XANAX) 0.25 MG tablet Take 1 tablet (0.25 mg total) by mouth at bedtime as needed for anxiety. 90 tablet 0  . apixaban (ELIQUIS) 2.5 MG TABS tablet Take 1 tablet (2.5 mg total) by mouth 2 (two) times daily. 180 tablet 1  . aspirin EC 81 MG EC tablet Take 1 tablet (81 mg total) by mouth daily. 30 tablet 0  .  atorvastatin (LIPITOR) 40 MG tablet TAKE 1 TABLET DAILY 90 tablet 0  . cephALEXin (KEFLEX) 500 MG capsule Take 1 capsule (500 mg total) by mouth 2 (two) times daily for 10 days. 20 capsule 0  . citalopram (CELEXA) 10 MG tablet TAKE 1 TABLET DAILY 90 tablet 0  . clotrimazole (LOTRIMIN) 1 % cream Apply 1 application topically 2 (two) times daily. 30 g 0  . COLCRYS 0.6 MG tablet TAKE 1 TABLET TWICE A DAY AS NEEDED (ACUTE GOUT FLARE) USE AS DIRECTED 90 tablet 7  . diclofenac sodium (VOLTAREN) 1 % GEL Apply 4 g topically 4 (four) times daily. 5 Tube 5  . dorzolamide (TRUSOPT) 2 % ophthalmic solution Place 1 drop into both eyes 3 (three) times daily.     Marland Kitchen ezetimibe (ZETIA) 10 MG tablet TAKE 1 TABLET DAILY 90 tablet 0  . finasteride (PROSCAR) 5 MG tablet TAKE  1 TABLET DAILY (Patient taking differently: Take 5 mg by mouth daily. ) 90 tablet 4  . gabapentin (NEURONTIN) 100 MG capsule TAKE 1 CAPSULE THREE TIMES A DAY 270 capsule 1  . glipiZIDE (GLUCOTROL) 5 MG tablet TAKE 1 TABLET TWICE A DAY BEFORE MEALS 180 tablet 3  . insulin detemir (LEVEMIR) 100 UNIT/ML injection Inject 0.28 mLs (28 Units total) into the skin daily at 10 pm. 10 mL 11  . insulin lispro (HUMALOG) 100 UNIT/ML injection Inject 0.04 mLs (4 Units total) into the skin 3 (three) times daily with meals. 10 mL 11  . Insulin Pen Needle (BD PEN NEEDLE NANO U/F) 32G X 4 MM MISC USE THREE TIMES A DAY FOR INSULIN ADMINISTRATION 270 each 4  . Insulin Syringe-Needle U-100 30G X 1/2" 1 ML MISC 1 each by Does not apply route 3 (three) times daily. 200 each 5  . latanoprost (XALATAN) 0.005 % ophthalmic solution Place 1 drop into both eyes at bedtime.     Marland Kitchen levothyroxine (SYNTHROID, LEVOTHROID) 50 MCG tablet TAKE 1 TABLET DAILY BEFORE BREAKFAST 90 tablet 0  . metolazone (ZAROXOLYN) 5 MG tablet Take 1 tablet (5 mg total) by mouth daily as needed (swelling). 90 tablet 3  . metoprolol succinate (TOPROL-XL) 50 MG 24 hr tablet TAKE 1 TABLET TWICE A DAY 180 tablet 1  . montelukast (SINGULAIR) 10 MG tablet TAKE 1 TABLET AT BEDTIME (Patient taking differently: Take 10 mg by mouth at bedtime. ) 90 tablet 4  . nitroGLYCERIN (NITROSTAT) 0.4 MG SL tablet Place 0.4 mg under the tongue every 5 (five) minutes as needed.      . nystatin-triamcinolone (MYCOLOG II) cream Apply 1 application topically 2 (two) times daily.     . potassium chloride SA (K-DUR,KLOR-CON) 20 MEQ tablet Take 4 tablets (80 meq) by mouth twice daily, take an extra 1 tablet (20 meq) on the days you take metolazone 732 tablet 3  . predniSONE (DELTASONE) 10 MG tablet Take 1 tablet (10 mg total) by mouth daily with breakfast. 90 tablet 1  . SYMBICORT 160-4.5 MCG/ACT inhaler USE 2 INHALATIONS TWICE A DAY (Patient taking differently: Inhale 1 puff into the  lungs 2 (two) times daily. ) 30.6 g 3  . tamsulosin (FLOMAX) 0.4 MG CAPS capsule TAKE 1 CAPSULE DAILY 90 capsule 0  . torsemide (DEMADEX) 20 MG tablet Take 40 mg by mouth 2 (two) times daily.    . traMADol (ULTRAM) 50 MG tablet Take 1 tablet (50 mg total) by mouth 2 (two) times daily. 180 tablet 0  . traZODone (DESYREL) 50 MG tablet Take 2  tablets (100 mg total) by mouth at bedtime as needed for sleep. 180 tablet 0  . umeclidinium bromide (INCRUSE ELLIPTA) 62.5 MCG/INH AEPB Inhale 1 puff into the lungs daily.     No current facility-administered medications for this visit.     Allergies  Allergen Reactions  . Contrast Media [Iodinated Diagnostic Agents] Shortness Of Breath  . Morphine And Related Other (See Comments)    Hallucinations   . Niacin And Related Dermatitis  . Other     Other reaction(s): SHORTNESS OF BREATH  . Amlodipine Rash    Family History  Problem Relation Age of Onset  . Heart disease Mother   . Heart disease Maternal Grandmother   . Diabetes Maternal Grandmother   . Cancer Neg Hx   . Stroke Neg Hx     Social History   Socioeconomic History  . Marital status: Married    Spouse name: Not on file  . Number of children: Not on file  . Years of education: Not on file  . Highest education level: Not on file  Occupational History  . Not on file  Social Needs  . Financial resource strain: Not very hard  . Food insecurity:    Worry: Never true    Inability: Never true  . Transportation needs:    Medical: No    Non-medical: No  Tobacco Use  . Smoking status: Former Smoker    Packs/day: 2.00    Years: 40.00    Pack years: 80.00    Types: Cigarettes    Last attempt to quit: 05/24/1990    Years since quitting: 28.2  . Smokeless tobacco: Former Systems developer    Quit date: 05/24/1990  Substance and Sexual Activity  . Alcohol use: No  . Drug use: No  . Sexual activity: Not Currently  Lifestyle  . Physical activity:    Days per week: Patient refused    Minutes  per session: Patient refused  . Stress: Not at all  Relationships  . Social connections:    Talks on phone: Patient refused    Gets together: Patient refused    Attends religious service: Patient refused    Active member of club or organization: Patient refused    Attends meetings of clubs or organizations: Patient refused    Relationship status: Patient refused  . Intimate partner violence:    Fear of current or ex partner: Patient refused    Emotionally abused: Patient refused    Physically abused: Patient refused    Forced sexual activity: Patient refused  Other Topics Concern  . Not on file  Social History Narrative  . Not on file     Constitutional: Pt reports fatigue. Denies fever, malaise, headache or abrupt weight changes.  Musculoskeletal: Pt reports right arm pain. Denies decrease in range of motion, difficulty with gait, muscle pain.  Skin:  Pt reports skin tear of right forearm, bruising of right arm. Denies redness, rashes, lesions or ulcercations.  Neurological: Pt reports lightheadedness. Denies  difficulty with memory, difficulty with speech or problems with balance and coordination.    No other specific complaints in a complete review of systems (except as listed in HPI above).   Wt Readings from Last 3 Encounters:  08/11/18 189 lb (85.7 kg)  06/13/18 188 lb 0.8 oz (85.3 kg)  04/24/18 188 lb (85.3 kg)    General: Appears his stated age, obese, chronically ill appearing, in NAD. Skin: Brushing noted of upper right arm, right forearm. Skin tear noted of  right upper arm. Larger, deep skin tear noted of right forearm. HEENT: Head: normal shape and size;  Pulmonary/Chest: Normal effort. Mild SOB, chronic. No respiratory distress.  Neurological: Alert and oriented.  Psychiatric: Mildly anxious appearing.   BMET    Component Value Date/Time   NA 140 08/11/2018 0542   NA 143 02/21/2015 1108   NA 137 08/05/2014 1839   K 3.1 (L) 08/11/2018 0542   K 3.6  08/05/2014 1839   CL 102 08/11/2018 0542   CL 106 08/05/2014 1839   CO2 29 08/11/2018 0542   CO2 23 08/05/2014 1839   GLUCOSE 96 08/11/2018 0542   GLUCOSE 235 (H) 08/05/2014 1839   BUN 35 (H) 08/11/2018 0542   BUN 26 02/21/2015 1108   BUN 19 08/05/2014 1839   CREATININE 1.59 (H) 08/11/2018 0542   CREATININE 1.20 08/05/2014 1839   CALCIUM 9.0 08/11/2018 0542   CALCIUM 8.6 (L) 08/05/2014 1839   GFRNONAA 39 (L) 08/11/2018 0542   GFRNONAA 57 (L) 08/05/2014 1839   GFRAA 46 (L) 08/11/2018 0542   GFRAA >60 08/05/2014 1839    Lipid Panel     Component Value Date/Time   CHOL 122 02/16/2018 1126   TRIG 189.0 (H) 02/16/2018 1126   HDL 47.50 02/16/2018 1126   CHOLHDL 3 02/16/2018 1126   VLDL 37.8 02/16/2018 1126   LDLCALC 37 02/16/2018 1126    CBC    Component Value Date/Time   WBC 14.3 (H) 08/11/2018 0542   RBC 4.56 08/11/2018 0542   HGB 13.2 08/11/2018 0542   HGB 13.3 08/05/2014 1839   HCT 41.0 08/11/2018 0542   HCT 40.1 08/05/2014 1839   PLT 193 08/11/2018 0542   PLT 246 08/05/2014 1839   MCV 89.9 08/11/2018 0542   MCV 83 08/05/2014 1839   MCH 28.9 08/11/2018 0542   MCHC 32.2 08/11/2018 0542   RDW 16.2 (H) 08/11/2018 0542   RDW 16.3 (H) 08/05/2014 1839   LYMPHSABS 3.3 08/11/2018 0542   LYMPHSABS 2.3 08/05/2014 1839   MONOABS 1.5 (H) 08/11/2018 0542   MONOABS 2.0 (H) 08/05/2014 1839   EOSABS 0.2 08/11/2018 0542   EOSABS 0.1 08/05/2014 1839   BASOSABS 0.0 08/11/2018 0542   BASOSABS 0.1 08/05/2014 1839    Hgb A1C Lab Results  Component Value Date   HGBA1C 6.5 (H) 04/17/2018        Assessment and Plan:  ER Follow Up for Lightheadedness, Fall, Right Arm Pain, Skin Tear of Right Forear:  ER notes, labs and imaging reviewed. Continue Septra for Prophylaxis [place], PT and Social Work I advised him that if he could not find a caregiver, that I would come out 2 x week and dress his wounds No medications changes at this time Will follow  Follow  Up Instructions:    I discussed the assessment and treatment plan with the patient. The patient was provided an opportunity to ask questions and all were answered. The patient agreed with the plan and demonstrated an understanding of the instructions.   The patient was advised to call back or seek an in-person evaluation if the symptoms worsen or if the condition fails to improve as anticipated.     Webb Silversmith, NP

## 2018-08-16 NOTE — Patient Instructions (Signed)
Skin Tear Care  A skin tear is a wound in which the top layer of skin peels off. To repair the skin, your doctor may use:   Tape.   Skin adhesive strips.  A bandage (dressing) may also be placed over the tape or skin adhesive strips.  How to care for your skin tear   Clean the wound as told by your doctor. You may be told to keep the wound dry for the first few days. If you are told to clean the wound:  ? Wash the wound with mild soap and water or a salt-water (saline) solution.  ? Rinse the wound with water to remove all soap.  ? Do not rub the wound dry. Let the wound air dry.   Change any dressings as told by your doctor. This includes changing the dressing if it gets wet, gets dirty, or starts to smell bad.   Do not scratch or pick at the wound.   Protect the injured area until it has healed.   Check your wound every day for signs of infection. Check for:  ? More redness, swelling, or pain.  ? More fluid or blood.  ? Warmth.  ? Pus or a bad smell.  Medicines     Take over-the-counter and prescription medicines only as told by your doctor.   If you were prescribed an antibiotic medicine, take or apply it as told by your doctor. Do not stop using the antibiotic even if your condition gets better.  General instructions   Keep the bandage dry as told by your doctor.   Do not take baths, swim, or do anything that puts your wound underwater until your doctor says it is okay.   Keep all follow-up visits as told by your doctor. This is important.  Contact a doctor if:   You have more redness, swelling, or pain around your wound.   You have more fluid or blood coming from your wound.   Your wound feels warm to the touch.   You have pus or a bad smell coming from your wound.  Get help right away if:   You have a red streak that goes away from the skin tear.   You have a fever and chills and your symptoms suddenly get worse.  This information is not intended to replace advice given to you by your health  care provider. Make sure you discuss any questions you have with your health care provider.  Document Released: 01/06/2008 Document Revised: 11/23/2015 Document Reviewed: 02/17/2015  Elsevier Interactive Patient Education  2019 [employer]

## 2018-08-22 ENCOUNTER — Encounter: Payer: Self-pay | Admitting: Internal Medicine

## 2018-08-22 ENCOUNTER — Other Ambulatory Visit: Payer: Medicare Other

## 2018-08-22 ENCOUNTER — Ambulatory Visit (INDEPENDENT_AMBULATORY_CARE_PROVIDER_SITE_OTHER): Payer: Medicare Other | Admitting: Internal Medicine

## 2018-08-22 VITALS — Temp 100.7°F

## 2018-08-22 DIAGNOSIS — R509 Fever, unspecified: Secondary | ICD-10-CM

## 2018-08-22 DIAGNOSIS — R350 Frequency of micturition: Secondary | ICD-10-CM | POA: Diagnosis not present

## 2018-08-22 DIAGNOSIS — R8290 Unspecified abnormal findings in urine: Secondary | ICD-10-CM | POA: Diagnosis not present

## 2018-08-22 DIAGNOSIS — R3915 Urgency of urination: Secondary | ICD-10-CM

## 2018-08-22 LAB — POC URINALSYSI DIPSTICK (AUTOMATED)
Bilirubin, UA: NEGATIVE
Glucose, UA: NEGATIVE
Ketones, UA: NEGATIVE
Nitrite, UA: NEGATIVE
Protein, UA: NEGATIVE
Spec Grav, UA: 1.015 (ref 1.010–1.025)
Urobilinogen, UA: 0.2 U/dL
pH, UA: 6 (ref 5.0–8.0)

## 2018-08-22 MED ORDER — CIPROFLOXACIN HCL 250 MG PO TABS: 250.0000 mg | ORAL_TABLET | Freq: Two times a day (BID) | ORAL | 0 refills | Status: DC

## 2018-08-22 NOTE — Addendum Note (Signed)
Addended by: Jearld Fenton on: 08/22/2018 10:09 AM   Modules accepted: Level of Service

## 2018-08-22 NOTE — Progress Notes (Signed)
Virtual Visit via Telephone Note  I connected with [name]. on 08/22/18 at 11:15 AM EDT by telephone and verified that I am speaking with the correct person using two identifiers.  Location: Patient: Home Provider: Office   I discussed the limitations, risks, security and privacy concerns of performing an evaluation and management service by telephone and the availability of in person appointments. I also discussed with the patient that there may be a patient responsible charge related to this service. The patient expressed understanding and agreed to proceed.   History of Present Illness:  Pt reports urinary urgency and  frequency, voiding small amounts, strong urine odor, fever and chills. This started 1 week ago. He denies dysuria or blood in his urine. He reports some low back pain. He has not taken anything OTC for this. He is on Keflex for prophylaxis after a large skin tear to the right arm. He reports the home health nurse is coming tomorrow to change the dressing.     Past Medical History:  Diagnosis Date  . Arthritis   . CAD    a. MI 01/29/1996 tx'd w/ TPA @ [facility]; b. Myoview 06/2005: EF 50%, scar @ apex, mild peri-infarct ischemia  . Cancer ([place])    skin  . Chronic atrial fibrillation    a. since 2006; b. on warfarin  . Chronic diastolic CHF (congestive heart failure) ([place])    a. echo 04/2006: EF lower limits of nl, mod LVH, mild aortic root dilatation, & mild MR, biatrial enlargement; b. echo 04/2013: EF 60%, mod dilated LA, mild MR & TR, mod pulm HTN w/ RV systolic pressure 53, c. echo 04/21/14: EF 55-60%, unable to exclude WMA, severely dilated LA 6.6 cm, nl RVSP, mildly dilated aortic root  . COPD (chronic obstructive pulmonary disease) ([facility])    oxygen prn at home  . CVA 6712,4580   x2  . DM   . Falls   . GERD (gastroesophageal reflux disease)   . History of kidney stones   . HYPERLIPIDEMIA   . HYPERTENSION   . Kidney stone    a. s/p left ureteral stenting  04/24/14  . Left arm weakness    limited movement. S/P fall injury  . Neuropathy of both feet   . Poor balance   . Wears dentures    full upper and lower    Current Outpatient Medications  Medication Sig Dispense Refill  . acetaminophen (TYLENOL) 650 MG CR tablet Take 650 mg by mouth every 8 (eight) hours as needed for pain.     Marland Kitchen albuterol (PROAIR HFA) 108 (90 Base) MCG/ACT inhaler USE 1 TO 2 INHALATIONS EVERY 4 HOURS AS NEEDED (Patient taking differently: Inhale 1-2 puffs into the lungs every 4 (four) hours as needed for wheezing or shortness of breath. ) 25.5 g 3  . ALPRAZolam (XANAX) 0.25 MG tablet Take 1 tablet (0.25 mg total) by mouth at bedtime as needed for anxiety. 90 tablet 0  . apixaban (ELIQUIS) 2.5 MG TABS tablet Take 1 tablet (2.5 mg total) by mouth 2 (two) times daily. 180 tablet 1  . aspirin EC 81 MG EC tablet Take 1 tablet (81 mg total) by mouth daily. 30 tablet 0  . atorvastatin (LIPITOR) 40 MG tablet TAKE 1 TABLET DAILY 90 tablet 0  . citalopram (CELEXA) 10 MG tablet TAKE 1 TABLET DAILY 90 tablet 0  . clotrimazole (LOTRIMIN) 1 % cream Apply 1 application topically 2 (two) times daily. 30 g 0  .  COLCRYS 0.6 MG tablet TAKE 1 TABLET TWICE A DAY AS NEEDED (ACUTE GOUT FLARE) USE AS DIRECTED 90 tablet 7  . diclofenac sodium (VOLTAREN) 1 % GEL Apply 4 g topically 4 (four) times daily. 5 Tube 5  . dorzolamide (TRUSOPT) 2 % ophthalmic solution Place 1 drop into both eyes 3 (three) times daily.     Marland Kitchen ezetimibe (ZETIA) 10 MG tablet TAKE 1 TABLET DAILY 90 tablet 0  . finasteride (PROSCAR) 5 MG tablet TAKE 1 TABLET DAILY (Patient taking differently: Take 5 mg by mouth daily. ) 90 tablet 4  . gabapentin (NEURONTIN) 100 MG capsule TAKE 1 CAPSULE THREE TIMES A DAY 270 capsule 1  . glipiZIDE (GLUCOTROL) 5 MG tablet TAKE 1 TABLET TWICE A DAY BEFORE MEALS 180 tablet 3  . insulin detemir (LEVEMIR) 100 UNIT/ML injection Inject 0.28 mLs (28 Units total) into the skin daily at 10 pm. 10 mL 11   . insulin lispro (HUMALOG) 100 UNIT/ML injection Inject 0.04 mLs (4 Units total) into the skin 3 (three) times daily with meals. 10 mL 11  . Insulin Pen Needle (BD PEN NEEDLE NANO U/F) 32G X 4 MM MISC USE THREE TIMES A DAY FOR INSULIN ADMINISTRATION 270 each 4  . Insulin Syringe-Needle U-100 30G X 1/2" 1 ML MISC 1 each by Does not apply route 3 (three) times daily. 200 each 5  . latanoprost (XALATAN) 0.005 % ophthalmic solution Place 1 drop into both eyes at bedtime.     Marland Kitchen levothyroxine (SYNTHROID, LEVOTHROID) 50 MCG tablet TAKE 1 TABLET DAILY BEFORE BREAKFAST 90 tablet 0  . metolazone (ZAROXOLYN) 5 MG tablet Take 1 tablet (5 mg total) by mouth daily as needed (swelling). 90 tablet 3  . metoprolol succinate (TOPROL-XL) 50 MG 24 hr tablet TAKE 1 TABLET TWICE A DAY 180 tablet 1  . montelukast (SINGULAIR) 10 MG tablet TAKE 1 TABLET AT BEDTIME (Patient taking differently: Take 10 mg by mouth at bedtime. ) 90 tablet 4  . nitroGLYCERIN (NITROSTAT) 0.4 MG SL tablet Place 0.4 mg under the tongue every 5 (five) minutes as needed.      . nystatin-triamcinolone (MYCOLOG II) cream Apply 1 application topically 2 (two) times daily.     . potassium chloride SA (K-DUR,KLOR-CON) 20 MEQ tablet Take 4 tablets (80 meq) by mouth twice daily, take an extra 1 tablet (20 meq) on the days you take metolazone 732 tablet 3  . predniSONE (DELTASONE) 10 MG tablet Take 1 tablet (10 mg total) by mouth daily with breakfast. 90 tablet 1  . SYMBICORT 160-4.5 MCG/ACT inhaler USE 2 INHALATIONS TWICE A DAY (Patient taking differently: Inhale 1 puff into the lungs 2 (two) times daily. ) 30.6 g 3  . tamsulosin (FLOMAX) 0.4 MG CAPS capsule TAKE 1 CAPSULE DAILY 90 capsule 0  . torsemide (DEMADEX) 20 MG tablet Take 40 mg by mouth 2 (two) times daily.    . traMADol (ULTRAM) 50 MG tablet Take 1 tablet (50 mg total) by mouth 2 (two) times daily. 180 tablet 0  . traZODone (DESYREL) 50 MG tablet Take 2 tablets (100 mg total) by mouth at  bedtime as needed for sleep. 180 tablet 0  . umeclidinium bromide (INCRUSE ELLIPTA) 62.5 MCG/INH AEPB Inhale 1 puff into the lungs daily.     No current facility-administered medications for this visit.     Allergies  Allergen Reactions  . Contrast Media [Iodinated Diagnostic Agents] Shortness Of Breath  . Morphine And Related Other (See Comments)  Hallucinations   . Niacin And Related Dermatitis  . Other     Other reaction(s): SHORTNESS OF BREATH  . Amlodipine Rash    Family History  Problem Relation Age of Onset  . Heart disease Mother   . Heart disease Maternal Grandmother   . Diabetes Maternal Grandmother   . Cancer Neg Hx   . Stroke Neg Hx     Social History   Socioeconomic History  . Marital status: Married    Spouse name: Not on file  . Number of children: Not on file  . Years of education: Not on file  . Highest education level: Not on file  Occupational History  . Not on file  Social Needs  . Financial resource strain: Not very hard  . Food insecurity:    Worry: Never true    Inability: Never true  . Transportation needs:    Medical: No    Non-medical: No  Tobacco Use  . Smoking status: Former Smoker    Packs/day: 2.00    Years: 40.00    Pack years: 80.00    Types: Cigarettes    Last attempt to quit: 05/24/1990    Years since quitting: 28.2  . Smokeless tobacco: Former Systems developer    Quit date: 05/24/1990  Substance and Sexual Activity  . Alcohol use: No  . Drug use: No  . Sexual activity: Not Currently  Lifestyle  . Physical activity:    Days per week: Patient refused    Minutes per session: Patient refused  . Stress: Not at all  Relationships  . Social connections:    Talks on phone: Patient refused    Gets together: Patient refused    Attends religious service: Patient refused    Active member of club or organization: Patient refused    Attends meetings of clubs or organizations: Patient refused    Relationship status: Patient refused  .  Intimate partner violence:    Fear of current or ex partner: Patient refused    Emotionally abused: Patient refused    Physically abused: Patient refused    Forced sexual activity: Patient refused  Other Topics Concern  . Not on file  Social History Narrative  . Not on file     Constitutional: Denies fever, malaise, fatigue, headache or abrupt weight changes.  Gastrointestinal: Denies abdominal pain, bloating, constipation, diarrhea or blood in the stool.  GU: Pt reports urinary urgency, frequency, voiding small amounts, strong urine odor.Denies pain with urination, burning sensation, blood in urine, or discharge.   No other specific complaints in a complete review of systems (except as listed in HPI above).   Wt Readings from Last 3 Encounters:  08/16/18 184 lb 8 oz (83.7 kg)  08/11/18 189 lb (85.7 kg)  06/13/18 188 lb 0.8 oz (85.3 kg)    Neurological: Alert and oriented.   BMET    Component Value Date/Time   NA 140 08/11/2018 0542   NA 143 02/21/2015 1108   NA 137 08/05/2014 1839   K 3.1 (L) 08/11/2018 0542   K 3.6 08/05/2014 1839   CL 102 08/11/2018 0542   CL 106 08/05/2014 1839   CO2 29 08/11/2018 0542   CO2 23 08/05/2014 1839   GLUCOSE 96 08/11/2018 0542   GLUCOSE 235 (H) 08/05/2014 1839   BUN 35 (H) 08/11/2018 0542   BUN 26 02/21/2015 1108   BUN 19 08/05/2014 1839   CREATININE 1.59 (H) 08/11/2018 0542   CREATININE 1.20 08/05/2014 1839   CALCIUM 9.0  08/11/2018 0542   CALCIUM 8.6 (L) 08/05/2014 1839   GFRNONAA 39 (L) 08/11/2018 0542   GFRNONAA 57 (L) 08/05/2014 1839   GFRAA 46 (L) 08/11/2018 0542   GFRAA >60 08/05/2014 1839    Lipid Panel     Component Value Date/Time   CHOL 122 02/16/2018 1126   TRIG 189.0 (H) 02/16/2018 1126   HDL 47.50 02/16/2018 1126   CHOLHDL 3 02/16/2018 1126   VLDL 37.8 02/16/2018 1126   LDLCALC 37 02/16/2018 1126    CBC    Component Value Date/Time   WBC 14.3 (H) 08/11/2018 0542   RBC 4.56 08/11/2018 0542   HGB 13.2  08/11/2018 0542   HGB 13.3 08/05/2014 1839   HCT 41.0 08/11/2018 0542   HCT 40.1 08/05/2014 1839   PLT 193 08/11/2018 0542   PLT 246 08/05/2014 1839   MCV 89.9 08/11/2018 0542   MCV 83 08/05/2014 1839   MCH 28.9 08/11/2018 0542   MCHC 32.2 08/11/2018 0542   RDW 16.2 (H) 08/11/2018 0542   RDW 16.3 (H) 08/05/2014 1839   LYMPHSABS 3.3 08/11/2018 0542   LYMPHSABS 2.3 08/05/2014 1839   MONOABS 1.5 (H) 08/11/2018 0542   MONOABS 2.0 (H) 08/05/2014 1839   EOSABS 0.2 08/11/2018 0542   EOSABS 0.1 08/05/2014 1839   BASOSABS 0.0 08/11/2018 0542   BASOSABS 0.1 08/05/2014 1839    Hgb A1C Lab Results  Component Value Date   HGBA1C 6.5 (H) 04/17/2018        Assessment and Plan:  Urinary Frequency, Urine Odor, Fever and Chills:  Urinalysis: 2+ leuks, 1+ blood Will send urine culture RX for Cipro 250 mg BID x 7 days Push fluids  Will follow up after urine culture, return precautions discussed  Follow Up Instructions:    I discussed the assessment and treatment plan with the patient. The patient was provided an opportunity to ask questions and all were answered. The patient agreed with the plan and demonstrated an understanding of the instructions.   The patient was advised to call back or seek an in-person evaluation if the symptoms worsen or if the condition fails to improve as anticipated.    Webb Silversmith, NP

## 2018-08-22 NOTE — Patient Instructions (Signed)
Urinary Tract Infection, Adult A urinary tract infection (UTI) is an infection of any part of the urinary tract. The urinary tract includes:  The kidneys.  The ureters.  The bladder.  The urethra. These organs make, store, and get rid of pee (urine) in the body. What are the causes? This is caused by germs (bacteria) in your genital area. These germs grow and cause swelling (inflammation) of your urinary tract. What increases the risk? You are more likely to develop this condition if:  You have a small, thin tube (catheter) to drain pee.  You cannot control when you pee or poop (incontinence).  You are male, and: ? You use these methods to prevent pregnancy: ? A medicine that kills sperm (spermicide). ? A device that blocks sperm (diaphragm). ? You have low levels of a male hormone (estrogen). ? You are pregnant.  You have genes that add to your risk.  You are sexually active.  You take antibiotic medicines.  You have trouble peeing because of: ? A prostate that is bigger than normal, if you are male. ? A blockage in the part of your body that drains pee from the bladder (urethra). ? A kidney stone. ? A nerve condition that affects your bladder (neurogenic bladder). ? Not getting enough to drink. ? Not peeing often enough.  You have other conditions, such as: ? Diabetes. ? A weak disease-fighting system (immune system). ? Sickle cell disease. ? Gout. ? Injury of the spine. What are the signs or symptoms? Symptoms of this condition include:  Needing to pee right away (urgently).  Peeing often.  Peeing small amounts often.  Pain or burning when peeing.  Blood in the pee.  Pee that smells bad or not like normal.  Trouble peeing.  Pee that is cloudy.  Fluid coming from the vagina, if you are male.  Pain in the belly or lower back. Other symptoms include:  Throwing up (vomiting).  No urge to eat.  Feeling mixed up (confused).  Being tired  and grouchy (irritable).  A fever.  Watery poop (diarrhea). How is this treated? This condition may be treated with:  Antibiotic medicine.  Other medicines.  Drinking enough water. Follow these instructions at home:  Medicines  Take over-the-counter and prescription medicines only as told by your doctor.  If you were prescribed an antibiotic medicine, take it as told by your doctor. Do not stop taking it even if you start to feel better. General instructions  Make sure you: ? Pee until your bladder is empty. ? Do not hold pee for a long time. ? Empty your bladder after sex. ? Wipe from front to back after pooping if you are a male. Use each tissue one time when you wipe.  Drink enough fluid to keep your pee pale yellow.  Keep all follow-up visits as told by your doctor. This is important. Contact a doctor if:  You do not get better after 1-2 days.  Your symptoms go away and then come back. Get help right away if:  You have very bad back pain.  You have very bad pain in your lower belly.  You have a fever.  You are sick to your stomach (nauseous).  You are throwing up. Summary  A urinary tract infection (UTI) is an infection of any part of the urinary tract.  This condition is caused by germs in your genital area.  There are many risk factors for a UTI. These include having a small, thin   tube to drain pee and not being able to control when you pee or poop.  Treatment includes antibiotic medicines for germs.  Drink enough fluid to keep your pee pale yellow. This information is not intended to replace advice given to you by your health care provider. Make sure you discuss any questions you have with your health care provider. Document Released: 09/15/2007 Document Revised: 10/06/2017 Document Reviewed: 10/06/2017 Elsevier Interactive Patient Education  2019 [employer]vier Inc.  

## 2018-08-23 ENCOUNTER — Telehealth: Payer: Self-pay

## 2018-08-23 ENCOUNTER — Other Ambulatory Visit: Payer: Self-pay

## 2018-08-23 ENCOUNTER — Other Ambulatory Visit: Payer: Self-pay | Admitting: Internal Medicine

## 2018-08-23 NOTE — Telephone Encounter (Signed)
Best number [phone] Pt stated he called drug store and they do not have rx  Please advise

## 2018-08-23 NOTE — Telephone Encounter (Signed)
Pt called stating the wrong medication was sent in. Please advise.

## 2018-08-23 NOTE — Telephone Encounter (Signed)
Olivia Mackie with [facility] left a voicemail stating that patient was given a script for Cipro 250 mg twice day on 08/22/18.Olivia Mackie stated that patient was already on Cipro 500 mg twice a day with no stop date. Olivia Mackie stated that patient is having uncontrolled diarrhea and not feeling well. Olivia Mackie stated that since Cipro has not been effective she wants to know if a different antibiotic can be prescribed? Olivia Mackie [phone]

## 2018-08-23 NOTE — Telephone Encounter (Addendum)
Pt had phone visit on 08/22/18 and pt understood was going to call in a different abx; pt said was same med with decreased dosage. Pt said was taking ciprofloxacin 500 mg from Dr Cleda Mccreedy. Pt request cb from [facility].

## 2018-08-23 NOTE — Telephone Encounter (Signed)
He said he was on Keflex. Will send in Keflex 500 mg BID.

## 2018-08-24 ENCOUNTER — Encounter: Payer: Self-pay | Admitting: *Deleted

## 2018-08-24 ENCOUNTER — Inpatient Hospital Stay
Admission: EM | Admit: 2018-08-24 | Discharge: 2018-08-27 | DRG: 871 | Disposition: A | Discharge status: Home Health | Payer: Medicare Other | Attending: Specialist | Admitting: Specialist

## 2018-08-24 ENCOUNTER — Emergency Department: Payer: Medicare Other

## 2018-08-24 ENCOUNTER — Ambulatory Visit: Payer: Self-pay | Admitting: *Deleted

## 2018-08-24 ENCOUNTER — Other Ambulatory Visit: Payer: Self-pay

## 2018-08-24 DIAGNOSIS — I10 Essential (primary) hypertension: Secondary | ICD-10-CM | POA: Diagnosis present

## 2018-08-24 DIAGNOSIS — J189 Pneumonia, unspecified organism: Secondary | ICD-10-CM | POA: Diagnosis present

## 2018-08-24 DIAGNOSIS — Z8249 Family history of ischemic heart disease and other diseases of the circulatory system: Secondary | ICD-10-CM

## 2018-08-24 DIAGNOSIS — Z87442 Personal history of urinary calculi: Secondary | ICD-10-CM

## 2018-08-24 DIAGNOSIS — E785 Hyperlipidemia, unspecified: Secondary | ICD-10-CM | POA: Diagnosis present

## 2018-08-24 DIAGNOSIS — I482 Chronic atrial fibrillation, unspecified: Secondary | ICD-10-CM | POA: Diagnosis present

## 2018-08-24 DIAGNOSIS — E1140 Type 2 diabetes mellitus with diabetic neuropathy, unspecified: Secondary | ICD-10-CM | POA: Diagnosis present

## 2018-08-24 DIAGNOSIS — I5032 Chronic diastolic (congestive) heart failure: Secondary | ICD-10-CM | POA: Diagnosis present

## 2018-08-24 DIAGNOSIS — E039 Hypothyroidism, unspecified: Secondary | ICD-10-CM | POA: Diagnosis present

## 2018-08-24 DIAGNOSIS — A419 Sepsis, unspecified organism: Secondary | ICD-10-CM | POA: Diagnosis not present

## 2018-08-24 DIAGNOSIS — E1121 Type 2 diabetes mellitus with diabetic nephropathy: Secondary | ICD-10-CM

## 2018-08-24 DIAGNOSIS — J440 Chronic obstructive pulmonary disease with acute lower respiratory infection: Secondary | ICD-10-CM | POA: Diagnosis present

## 2018-08-24 DIAGNOSIS — Z794 Long term (current) use of insulin: Secondary | ICD-10-CM

## 2018-08-24 DIAGNOSIS — N390 Urinary tract infection, site not specified: Secondary | ICD-10-CM | POA: Diagnosis present

## 2018-08-24 DIAGNOSIS — N400 Enlarged prostate without lower urinary tract symptoms: Secondary | ICD-10-CM | POA: Diagnosis present

## 2018-08-24 DIAGNOSIS — K219 Gastro-esophageal reflux disease without esophagitis: Secondary | ICD-10-CM | POA: Diagnosis present

## 2018-08-24 DIAGNOSIS — Z9841 Cataract extraction status, right eye: Secondary | ICD-10-CM

## 2018-08-24 DIAGNOSIS — Y95 Nosocomial condition: Secondary | ICD-10-CM | POA: Diagnosis present

## 2018-08-24 DIAGNOSIS — Z79899 Other long term (current) drug therapy: Secondary | ICD-10-CM

## 2018-08-24 DIAGNOSIS — I25118 Atherosclerotic heart disease of native coronary artery with other forms of angina pectoris: Secondary | ICD-10-CM | POA: Diagnosis present

## 2018-08-24 DIAGNOSIS — I272 Pulmonary hypertension, unspecified: Secondary | ICD-10-CM | POA: Diagnosis present

## 2018-08-24 DIAGNOSIS — I110 Hypertensive heart disease with heart failure: Secondary | ICD-10-CM | POA: Diagnosis present

## 2018-08-24 DIAGNOSIS — Z7951 Long term (current) use of inhaled steroids: Secondary | ICD-10-CM

## 2018-08-24 DIAGNOSIS — E1122 Type 2 diabetes mellitus with diabetic chronic kidney disease: Secondary | ICD-10-CM

## 2018-08-24 DIAGNOSIS — Z7952 Long term (current) use of systemic steroids: Secondary | ICD-10-CM

## 2018-08-24 DIAGNOSIS — Z833 Family history of diabetes mellitus: Secondary | ICD-10-CM

## 2018-08-24 DIAGNOSIS — L03116 Cellulitis of left lower limb: Secondary | ICD-10-CM | POA: Diagnosis present

## 2018-08-24 DIAGNOSIS — M1990 Unspecified osteoarthritis, unspecified site: Secondary | ICD-10-CM | POA: Diagnosis present

## 2018-08-24 DIAGNOSIS — Z9842 Cataract extraction status, left eye: Secondary | ICD-10-CM

## 2018-08-24 DIAGNOSIS — E1151 Type 2 diabetes mellitus with diabetic peripheral angiopathy without gangrene: Secondary | ICD-10-CM | POA: Diagnosis present

## 2018-08-24 DIAGNOSIS — I2510 Atherosclerotic heart disease of native coronary artery without angina pectoris: Secondary | ICD-10-CM | POA: Diagnosis present

## 2018-08-24 DIAGNOSIS — Z888 Allergy status to other drugs, medicaments and biological substances status: Secondary | ICD-10-CM

## 2018-08-24 DIAGNOSIS — Z955 Presence of coronary angioplasty implant and graft: Secondary | ICD-10-CM

## 2018-08-24 DIAGNOSIS — Z89422 Acquired absence of other left toe(s): Secondary | ICD-10-CM

## 2018-08-24 DIAGNOSIS — F419 Anxiety disorder, unspecified: Secondary | ICD-10-CM | POA: Diagnosis present

## 2018-08-24 DIAGNOSIS — J449 Chronic obstructive pulmonary disease, unspecified: Secondary | ICD-10-CM | POA: Diagnosis present

## 2018-08-24 DIAGNOSIS — Z20828 Contact with and (suspected) exposure to other viral communicable diseases: Secondary | ICD-10-CM | POA: Diagnosis present

## 2018-08-24 DIAGNOSIS — Z8673 Personal history of transient ischemic attack (TIA), and cerebral infarction without residual deficits: Secondary | ICD-10-CM

## 2018-08-24 DIAGNOSIS — Z79891 Long term (current) use of opiate analgesic: Secondary | ICD-10-CM

## 2018-08-24 DIAGNOSIS — Z91041 Radiographic dye allergy status: Secondary | ICD-10-CM

## 2018-08-24 DIAGNOSIS — Z961 Presence of intraocular lens: Secondary | ICD-10-CM | POA: Diagnosis present

## 2018-08-24 DIAGNOSIS — Z87891 Personal history of nicotine dependence: Secondary | ICD-10-CM

## 2018-08-24 DIAGNOSIS — Z7901 Long term (current) use of anticoagulants: Secondary | ICD-10-CM

## 2018-08-24 LAB — CBC WITH DIFFERENTIAL/PLATELET
Abs Immature Granulocytes: 0.17 10*3/uL — ABNORMAL HIGH (ref 0.00–0.07)
Basophils Absolute: 0 10*3/uL (ref 0.0–0.1)
Basophils Relative: 0 %
Eosinophils Absolute: 0 10*3/uL (ref 0.0–0.5)
Eosinophils Relative: 0 %
HCT: 44.3 % (ref 39.0–52.0)
Hemoglobin: 13.8 g/dL (ref 13.0–17.0)
Immature Granulocytes: 1 %
Lymphocytes Relative: 4 %
Lymphs Abs: 0.8 10*3/uL (ref 0.7–4.0)
MCH: 28.6 pg (ref 26.0–34.0)
MCHC: 31.2 g/dL (ref 30.0–36.0)
MCV: 91.9 fL (ref 80.0–100.0)
Monocytes Absolute: 1.1 10*3/uL — ABNORMAL HIGH (ref 0.1–1.0)
Monocytes Relative: 6 %
Neutro Abs: 15 10*3/uL — ABNORMAL HIGH (ref 1.7–7.7)
Neutrophils Relative %: 89 %
Platelets: 218 10*3/uL (ref 150–400)
RBC: 4.82 MIL/uL (ref 4.22–5.81)
RDW: 16.8 % — ABNORMAL HIGH (ref 11.5–15.5)
WBC: 17 10*3/uL — ABNORMAL HIGH (ref 4.0–10.5)
nRBC: 0 % (ref 0.0–0.2)

## 2018-08-24 LAB — PROTIME-INR
INR: 1.4 — ABNORMAL HIGH (ref 0.8–1.2)
Prothrombin Time: 16.6 seconds — ABNORMAL HIGH (ref 11.4–15.2)

## 2018-08-24 LAB — URINALYSIS, COMPLETE (UACMP) WITH MICROSCOPIC
Bacteria, UA: NONE SEEN
Bilirubin Urine: NEGATIVE
Glucose, UA: 50 mg/dL — AB
Ketones, ur: NEGATIVE mg/dL
Nitrite: NEGATIVE
Protein, ur: NEGATIVE mg/dL
Specific Gravity, Urine: 1.009 (ref 1.005–1.030)
Squamous Epithelial / HPF: NONE SEEN (ref 0–5)
pH: 5 (ref 5.0–8.0)

## 2018-08-24 LAB — URINE CULTURE
MICRO NUMBER:: 466514
Result:: NO GROWTH
SPECIMEN QUALITY:: ADEQUATE

## 2018-08-24 LAB — COMPREHENSIVE METABOLIC PANEL
ALT: 22 U/L (ref 0–44)
AST: 20 U/L (ref 15–41)
Albumin: 3 g/dL — ABNORMAL LOW (ref 3.5–5.0)
Alkaline Phosphatase: 72 U/L (ref 38–126)
Anion gap: 10 (ref 5–15)
BUN: 36 mg/dL — ABNORMAL HIGH (ref 8–23)
CO2: 26 mmol/L (ref 22–32)
Calcium: 8.7 mg/dL — ABNORMAL LOW (ref 8.9–10.3)
Chloride: 102 mmol/L (ref 98–111)
Creatinine, Ser: 1.48 mg/dL — ABNORMAL HIGH (ref 0.61–1.24)
GFR calc Af Amer: 50 mL/min — ABNORMAL LOW (ref >60)
GFR calc non Af Amer: 43 mL/min — ABNORMAL LOW (ref >60)
Glucose, Bld: 224 mg/dL — ABNORMAL HIGH (ref 70–99)
Potassium: 4.8 mmol/L (ref 3.5–5.1)
Sodium: 138 mmol/L (ref 135–145)
Total Bilirubin: 0.6 mg/dL (ref 0.3–1.2)
Total Protein: 6.7 g/dL (ref 6.5–8.1)

## 2018-08-24 LAB — BRAIN NATRIURETIC PEPTIDE: B Natriuretic Peptide: 317 pg/mL — ABNORMAL HIGH (ref 0.0–100.0)

## 2018-08-24 LAB — SARS CORONAVIRUS 2 BY RT PCR (HOSPITAL ORDER, PERFORMED IN ~~LOC~~ HOSPITAL LAB): SARS Coronavirus 2: NEGATIVE

## 2018-08-24 LAB — TROPONIN I: Troponin I: <0.03 ng/mL (ref <0.03)

## 2018-08-24 LAB — LACTIC ACID, PLASMA
Lactic Acid, Venous: 2.3 mmol/L (ref 0.5–1.9)
Lactic Acid, Venous: 2.1 mmol/L (ref 0.5–1.9)

## 2018-08-24 MED ORDER — SODIUM CHLORIDE 0.9% FLUSH: 3.0000 mL | Freq: Once | INTRAVENOUS | Status: DC

## 2018-08-24 MED ORDER — SODIUM CHLORIDE 0.9 % IV SOLN
2.0000 g | Freq: Two times a day (BID) | INTRAVENOUS | Status: DC
  Administered 2018-08-25 – 2018-08-27 (×5): 2 g via INTRAVENOUS
  Filled 2018-08-24 (×6): qty 2

## 2018-08-24 MED ORDER — SODIUM CHLORIDE 0.9 % IV SOLN
1.0000 g | Freq: Once | INTRAVENOUS | Status: AC
  Administered 2018-08-24: 1 g via INTRAVENOUS
  Filled 2018-08-24: qty 10

## 2018-08-24 MED ORDER — SODIUM CHLORIDE 0.9 % IV SOLN: 2.0000 g | Freq: Two times a day (BID) | INTRAVENOUS | Status: DC

## 2018-08-24 MED ORDER — SODIUM CHLORIDE 0.9 % IV BOLUS
500.0000 mL | Freq: Once | INTRAVENOUS | Status: AC
  Administered 2018-08-24: 500 mL via INTRAVENOUS

## 2018-08-24 MED ORDER — METOPROLOL SUCCINATE ER 50 MG PO TB24
50.0000 mg | ORAL_TABLET | Freq: Once | ORAL | Status: AC
  Administered 2018-08-24: 50 mg via ORAL
  Filled 2018-08-24: qty 1

## 2018-08-24 MED ORDER — VANCOMYCIN HCL IN DEXTROSE 750-5 MG/150ML-% IV SOLN
750.0000 mg | INTRAVENOUS | Status: DC
  Filled 2018-08-24: qty 150

## 2018-08-24 MED ORDER — SODIUM CHLORIDE 0.9 % IV SOLN
500.0000 mg | Freq: Once | INTRAVENOUS | Status: AC
  Administered 2018-08-24: 500 mg via INTRAVENOUS
  Filled 2018-08-24: qty 500

## 2018-08-24 MED ORDER — VANCOMYCIN HCL 10 G IV SOLR
2000.0000 mg | Freq: Once | INTRAVENOUS | Status: AC
  Administered 2018-08-24: 2000 mg via INTRAVENOUS
  Filled 2018-08-24: qty 2000

## 2018-08-24 NOTE — ED Notes (Signed)
Date and time results received: 08/24/18 2102 (use smartphrase ".now" to insert current time)  Test: lactic acid Critical Value: 2.1  Name of Provider Notified: Alfred Levins  Orders Received? Or Actions Taken?: Orders Received - See Orders for details

## 2018-08-24 NOTE — Consult Note (Signed)
CODE SEPSIS - PHARMACY COMMUNICATION  **Broad Spectrum Antibiotics should be administered within 1 hour of Sepsis diagnosis**  Time Code Sepsis Called/Page Received: 1852  Antibiotics Ordered: ceftriaxone and azithromycin  Time of 1st antibiotic administration: 1940  Additional action taken by pharmacy: none required  If necessary, Name of Provider/Nurse Contacted: N/A  Dallie Piles ,PharmD Clinical Pharmacist  08/24/2018  7:42 PM

## 2018-08-24 NOTE — Consult Note (Signed)
Pharmacy Antibiotic Note  [name]. is a 85 y.o. male admitted on 08/24/2018 with sepsis.  Pharmacy has been consulted for vancomycin and cefepime dosing.  Plan: Vancomycin 2000mg  IV x 1 dose loading dose, followed by Vancomycin 750 mg IV Q 24hrs. Goal AUC 400-550. Expected AUC: 471 SCr used: 1.48  Start cefepime 2g IV every 12 hours   Height: 5\' 6"  (167.6 cm) Weight: 217 lb 9.5 oz (98.7 kg) IBW/kg (Calculated) : 63.8  Temp (24hrs), Avg:98.7 F (37.1 C), Min:98.7 F (37.1 C), Max:98.7 F (37.1 C)  Recent Labs  Lab 08/24/18 1749 08/24/18 2035  WBC 17.0*  --   CREATININE 1.48*  --   LATICACIDVEN 2.3* 2.1*    Estimated Creatinine Clearance: 40.9 mL/min (A) (by C-G formula based on SCr of 1.48 mg/dL (H)).    Allergies  Allergen Reactions  . Contrast Media [Iodinated Diagnostic Agents] Shortness Of Breath  . Morphine And Related Other (See Comments)    Hallucinations   . Niacin And Related Dermatitis  . Other     Other reaction(s): SHORTNESS OF BREATH  . Amlodipine Rash    Antimicrobials this admission: 5/14 azithromycin/ceftriaxone  >> x1   5/14 vancomycin  >> 5/14 Cefepime >>   Microbiology results: 5/14 BCx: pending 5/14 SARS Coronavirus 2: negative   Thank you for allowing pharmacy to be a part of this patient's care.  Pernell Dupre, PharmD, BCPS Clinical Pharmacist 08/24/2018 11:05 PM

## 2018-08-24 NOTE — ED Notes (Signed)
This EDT answered phone for Shawn Kraft RN from the lab, due to her being on a call, pt with critical lactic of 2.1, RN notified

## 2018-08-24 NOTE — Telephone Encounter (Signed)
I spoke with pt and he finally agreed to go to ED for eval. I spoke with Butch Penny pts daughter and she will call 911 now before pt changes his mind. FYI to Avie Echevaria NP.

## 2018-08-24 NOTE — ED Triage Notes (Signed)
Pt presents on advice from nurse line that daughter called. Pt has no new complaints and states he feels "good" today. Pt in no acute distress. 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 Pt is A&Ox4, presently in a-fib w/ rate under 120. Pt has pronounced ascites. PMH of Stage 4 lung CA but is unable to tell where metastisis occurs.

## 2018-08-24 NOTE — ED Notes (Signed)
Unable to place IV on R arm due to bandages and skin issues

## 2018-08-24 NOTE — Telephone Encounter (Signed)
Can you call and see what is going on.

## 2018-08-24 NOTE — H&P (Signed)
[facility] NAME: [name]    MR#:  [id number]  DATE OF BIRTH:  [birth year]  DATE OF ADMISSION:  08/24/2018  PRIMARY CARE PHYSICIAN: Jearld Fenton, NP   REQUESTING/REFERRING PHYSICIAN: Alfred Levins, MD  CHIEF COMPLAINT:   Chief Complaint  Patient presents with  . Shortness of Breath    HISTORY OF PRESENT ILLNESS:  [name]  is a 85 y.o. male who presents with chief complaint as above.  Patient presents to the ED with a complaint of shortness of breath.  He is found here on evaluation to have edema versus pneumonia.  He was recently here in the hospital for diabetic toe infection.  He has been on antibiotics since that time for his infection.  Blood work here he has a significant leukocytosis with leftward shift, and is tachypneic and tachycardic.  He does have a history of chronic diastolic heart failure, though his BNP tonight is around 300, which is consistent with prior values in our system.  He does meet sepsis criteria here in the ED.  Hospitalist were called for admission  PAST MEDICAL HISTORY:   Past Medical History:  Diagnosis Date  . Arthritis   . CAD    a. MI 01/29/1996 tx'd w/ TPA @ [facility]; b. Myoview 06/2005: EF 50%, scar @ apex, mild peri-infarct ischemia  . Cancer ([place])    skin  . Chronic atrial fibrillation    a. since 2006; b. on warfarin  . Chronic diastolic CHF (congestive heart failure) ([place])    a. echo 04/2006: EF lower limits of nl, mod LVH, mild aortic root dilatation, & mild MR, biatrial enlargement; b. echo 04/2013: EF 60%, mod dilated LA, mild MR & TR, mod pulm HTN w/ RV systolic pressure 53, c. echo 04/21/14: EF 55-60%, unable to exclude WMA, severely dilated LA 6.6 cm, nl RVSP, mildly dilated aortic root  . COPD (chronic obstructive pulmonary disease) (HCC)    oxygen prn at home  . CVA [id number]   x2  . DM   . Falls   . GERD (gastroesophageal reflux disease)   . History of kidney stones    . HYPERLIPIDEMIA   . HYPERTENSION   . Kidney stone    a. s/p left ureteral stenting 04/24/14  . Left arm weakness    limited movement. S/P fall injury  . Neuropathy of both feet   . Poor balance   . Wears dentures    full upper and lower     PAST SURGICAL HISTORY:   Past Surgical History:  Procedure Laterality Date  . AMPUTATION TOE Left 06/13/2018   Procedure: 1st Ray Resection Left;  Surgeon: Sharlotte Alamo, DPM;  Location: [facility];  Service: Podiatry;  Laterality: Left;  . BLADDER SURGERY     stent placement   . CARDIAC CATHETERIZATION  1997   [facility]  . CAROTID STENT INSERTION  1997  . CATARACT EXTRACTION W/PHACO Left 10/11/2017   Procedure: CATARACT EXTRACTION PHACO AND INTRAOCULAR LENS PLACEMENT ([place]) COMPLICATED LEFT DIABETIC;  Surgeon: Leandrew Koyanagi, MD;  Location: [facility];  Service: Ophthalmology;  Laterality: Left;  MALYUGIN Diabetic - insulin  . CATARACT EXTRACTION W/PHACO Right 11/30/2017   Procedure: CATARACT EXTRACTION PHACO AND INTRAOCULAR LENS PLACEMENT ([place]) COMPLICATED  RIGHT DIABETIC;  Surgeon: Leandrew Koyanagi, MD;  Location: [facility];  Service: Ophthalmology;  Laterality: Right;  Diabetic - insulin  . CIRCUMCISION  2016  . CORONARY ANGIOPLASTY  1997   s/p  stent placement x 2   . CYSTOSCOPY W/ URETERAL STENT REMOVAL Left 10/09/2014   Procedure: CYSTOSCOPY WITH STENT REMOVAL;  Surgeon: Hollice Espy, MD;  Location: [facility];  Service: Urology;  Laterality: Left;  . CYSTOSCOPY WITH STENT PLACEMENT Left 10/09/2014   Procedure: CYSTOSCOPY WITH STENT PLACEMENT;  Surgeon: Hollice Espy, MD;  Location: [facility];  Service: Urology;  Laterality: Left;  . KIDNEY SURGERY  05/2013   s/p stent placement   . LOWER EXTREMITY ANGIOGRAPHY Left 06/12/2018   Procedure: Lower Extremity Angiography;  Surgeon: Algernon Huxley, MD;  Location: [facility];  Service: Cardiovascular;  Laterality: Left;  . stents ureters Bilateral   . TONSILLECTOMY AND  ADENOIDECTOMY  1959  . URETEROSCOPY WITH HOLMIUM LASER LITHOTRIPSY Left 10/09/2014   Procedure: URETEROSCOPY WITH HOLMIUM LASER LITHOTRIPSY;  Surgeon: Hollice Espy, MD;  Location: [facility];  Service: Urology;  Laterality: Left;     SOCIAL HISTORY:   Social History   Tobacco Use  . Smoking status: Former Smoker    Packs/day: 2.00    Years: 40.00    Pack years: 80.00    Types: Cigarettes    Last attempt to quit: 05/24/1990    Years since quitting: 28.2  . Smokeless tobacco: Former Systems developer    Quit date: 05/24/1990  Substance Use Topics  . Alcohol use: No     FAMILY HISTORY:   Family History  Problem Relation Age of Onset  . Heart disease Mother   . Heart disease Maternal Grandmother   . Diabetes Maternal Grandmother   . Cancer Neg Hx   . Stroke Neg Hx      DRUG ALLERGIES:   Allergies  Allergen Reactions  . Contrast Media [Iodinated Diagnostic Agents] Shortness Of Breath  . Morphine And Related Other (See Comments)    Hallucinations   . Niacin And Related Dermatitis  . Other     Other reaction(s): SHORTNESS OF BREATH  . Amlodipine Rash    MEDICATIONS AT HOME:   Prior to Admission medications   Medication Sig Start Date End Date Taking? Authorizing Provider  acetaminophen (TYLENOL) 650 MG CR tablet Take 650 mg by mouth every 8 (eight) hours as needed for pain.    Yes [provider]  albuterol (PROAIR HFA) 108 (90 Base) MCG/ACT inhaler USE 1 TO 2 INHALATIONS EVERY 4 HOURS AS NEEDED Patient taking differently: Inhale 1-2 puffs into the lungs every 4 (four) hours as needed for wheezing or shortness of breath.  11/09/17  Yes Wilhelmina Mcardle, MD  ALPRAZolam Duanne Moron) 0.25 MG tablet Take 1 tablet (0.25 mg total) by mouth at bedtime as needed for anxiety. 07/19/18  Yes Baity, Coralie Keens, NP  apixaban (ELIQUIS) 2.5 MG TABS tablet Take 1 tablet (2.5 mg total) by mouth 2 (two) times daily. 03/27/18  Yes Minna Merritts, MD  atorvastatin (LIPITOR) 40 MG tablet TAKE 1  TABLET DAILY Patient taking differently: Take 40 mg by mouth daily at 6 PM.  07/17/18  Yes Baity, Coralie Keens, NP  cephALEXin (KEFLEX) 500 MG capsule Take 500 mg by mouth 3 (three) times daily.  08/08/18 08/28/18 Yes [provider]  ciprofloxacin (CIPRO) 250 MG tablet Take 1 tablet (250 mg total) by mouth 2 (two) times daily. 08/22/18  Yes Jearld Fenton, NP  citalopram (CELEXA) 10 MG tablet TAKE 1 TABLET DAILY 07/18/18  Yes Baity, Coralie Keens, NP  COMBIGAN 0.2-0.5 % ophthalmic solution Place 1 drop into both eyes daily. 06/26/18  Yes [provider]  dorzolamide (TRUSOPT) 2 % ophthalmic solution Place 1 drop into both eyes 3 (three) times daily.  03/31/16  Yes [provider]  ezetimibe (ZETIA) 10 MG tablet TAKE 1 TABLET DAILY 07/17/18  Yes Baity, Coralie Keens, NP  finasteride (PROSCAR) 5 MG tablet TAKE 1 TABLET DAILY Patient taking differently: Take 5 mg by mouth daily.  06/29/17  Yes Baity, Coralie Keens, NP  gabapentin (NEURONTIN) 100 MG capsule TAKE 1 CAPSULE THREE TIMES A DAY Patient taking differently: Take 100 mg by mouth at bedtime.  06/09/18  Yes Baity, Coralie Keens, NP  insulin detemir (LEVEMIR) 100 UNIT/ML injection Inject 0.28 mLs (28 Units total) into the skin daily at 10 pm. Patient taking differently: Inject 42 Units into the skin daily at 10 pm.  06/15/18  Yes Fritzi Mandes, MD  insulin lispro (HUMALOG) 100 UNIT/ML injection Inject 0.04 mLs (4 Units total) into the skin 3 (three) times daily with meals. Patient taking differently: Inject 10-18 Units into the skin 3 (three) times daily with meals. 18 am, 3 lunch, 10 supper 06/15/18  Yes Fritzi Mandes, MD  latanoprost (XALATAN) 0.005 % ophthalmic solution Place 1 drop into both eyes at bedtime.  04/18/16  Yes [provider]  levothyroxine (SYNTHROID, LEVOTHROID) 50 MCG tablet TAKE 1 TABLET DAILY BEFORE BREAKFAST 06/27/18  Yes Baity, Coralie Keens, NP  metoprolol succinate (TOPROL-XL) 50 MG 24 hr tablet TAKE 1 TABLET TWICE A DAY 06/09/18  Yes  Baity, Coralie Keens, NP  potassium chloride SA (K-DUR,KLOR-CON) 20 MEQ tablet Take 4 tablets (80 meq) by mouth twice daily, take an extra 1 tablet (20 meq) on the days you take metolazone Patient taking differently: Take 80 mEq by mouth 2 (two) times daily. Take 4 tablets (80 meq) by mouth twice daily at breakfast and at lunch, take an extra 1 tablet (20 meq) on the days you take metolazone put tablet in applesauce to aid swallowing 02/10/18  Yes Gollan, Kathlene November, MD  predniSONE (DELTASONE) 10 MG tablet Take 1 tablet (10 mg total) by mouth daily with breakfast. 05/05/18  Yes Baity, Coralie Keens, NP  SYMBICORT 160-4.5 MCG/ACT inhaler USE 2 INHALATIONS TWICE A DAY Patient taking differently: Inhale 1 puff into the lungs 2 (two) times daily.  08/31/17  Yes Wilhelmina Mcardle, MD  tamsulosin (FLOMAX) 0.4 MG CAPS capsule TAKE 1 CAPSULE DAILY 07/18/18  Yes Jearld Fenton, NP  torsemide (DEMADEX) 20 MG tablet Take 40 mg by mouth 2 (two) times daily.   Yes [provider]  traZODone (DESYREL) 50 MG tablet Take 2 tablets (100 mg total) by mouth at bedtime as needed for sleep. 02/16/18  Yes Baity, Coralie Keens, NP  umeclidinium bromide (INCRUSE ELLIPTA) 62.5 MCG/INH AEPB Inhale 1 puff into the lungs daily.   Yes [provider]  clotrimazole (LOTRIMIN) 1 % cream Apply 1 application topically 2 (two) times daily. 11/18/17   Bedsole, Amy E, MD  COLCRYS 0.6 MG tablet TAKE 1 TABLET TWICE A DAY AS NEEDED (ACUTE GOUT FLARE) USE AS DIRECTED 07/06/18   Jearld Fenton, NP  diclofenac sodium (VOLTAREN) 1 % GEL Apply 4 g topically 4 (four) times daily. 01/05/18   Copland, Frederico Hamman, MD  glipiZIDE (GLUCOTROL) 5 MG tablet TAKE 1 TABLET TWICE A DAY BEFORE MEALS Patient not taking: Reported on 08/24/2018 07/31/18   Jearld Fenton, NP  Insulin Pen Needle (BD PEN NEEDLE NANO U/F) 32G X 4 MM MISC USE THREE TIMES A DAY FOR INSULIN ADMINISTRATION 02/15/18  Jearld Fenton, NP  Insulin Syringe-Needle U-100 30G X 1/2" 1 ML MISC 1 each by  Does not apply route 3 (three) times daily. 07/19/14   Jearld Fenton, NP  metolazone (ZAROXOLYN) 5 MG tablet Take 1 tablet (5 mg total) by mouth daily as needed (swelling). 03/13/18   Minna Merritts, MD  montelukast (SINGULAIR) 10 MG tablet TAKE 1 TABLET AT BEDTIME Patient taking differently: Take 10 mg by mouth at bedtime.  02/24/18   Jearld Fenton, NP  nitroGLYCERIN (NITROSTAT) 0.4 MG SL tablet Place 0.4 mg under the tongue every 5 (five) minutes as needed.      [provider]  nystatin-triamcinolone (MYCOLOG II) cream Apply 1 application topically 2 (two) times daily.  03/12/14   [provider]  traMADol (ULTRAM) 50 MG tablet Take 1 tablet (50 mg total) by mouth 2 (two) times daily. 06/29/18   Jearld Fenton, NP    REVIEW OF SYSTEMS:  Review of Systems  Constitutional: Negative for chills, fever, malaise/fatigue and weight loss.  HENT: Negative for ear pain, hearing loss and tinnitus.   Eyes: Negative for blurred vision, double vision, pain and redness.  Respiratory: Positive for cough and shortness of breath. Negative for hemoptysis.   Cardiovascular: Negative for chest pain, palpitations, orthopnea and leg swelling.  Gastrointestinal: Negative for abdominal pain, constipation, diarrhea, nausea and vomiting.  Genitourinary: Negative for dysuria, frequency and hematuria.  Musculoskeletal: Negative for back pain, joint pain and neck pain.  Skin:       No acne, rash, or lesions  Neurological: Negative for dizziness, tremors, focal weakness and weakness.  Endo/Heme/Allergies: Negative for polydipsia. Does not bruise/bleed easily.  Psychiatric/Behavioral: Negative for depression. The patient is not nervous/anxious and does not have insomnia.      VITAL SIGNS:   Vitals:   08/24/18 2100 08/24/18 2115 08/24/18 2130 08/24/18 2145  BP: (!) 119/58 (!) 108/53 120/69 114/63  Pulse: 88 81 92 93  Resp: (!) 27 (!) 25 (!) 25 (!) 25  Temp:      TempSrc:      SpO2: 100%  100% 100% 100%  Weight:      Height:       Wt Readings from Last 3 Encounters:  08/24/18 98.7 kg  08/16/18 83.7 kg  08/11/18 85.7 kg    PHYSICAL EXAMINATION:  Physical Exam  Vitals reviewed. Constitutional: He is oriented to person, place, and time. He appears well-developed and well-nourished. No distress.  HENT:  Head: Normocephalic and atraumatic.  Mouth/Throat: Oropharynx is clear and moist.  Eyes: Pupils are equal, round, and reactive to light. Conjunctivae and EOM are normal. No scleral icterus.  Neck: Normal range of motion. Neck supple. No JVD present. No thyromegaly present.  Cardiovascular: Regular rhythm and intact distal pulses. Exam reveals no gallop and no friction rub.  No murmur heard. Tachycardic  Respiratory: Effort normal. No respiratory distress. He has no wheezes. He has no rales.  Coarse breath sounds, tachypneic  GI: Soft. Bowel sounds are normal. He exhibits no distension. There is no abdominal tenderness.  Musculoskeletal: Normal range of motion.        General: No edema.     Comments: No arthritis, no gout  Lymphadenopathy:    He has no cervical adenopathy.  Neurological: He is alert and oriented to person, place, and time. No cranial nerve deficit.  No dysarthria, no aphasia  Skin: Skin is warm and dry. No rash noted. No erythema.  Psychiatric: He has a  normal mood and affect. His behavior is normal. Judgment and thought content normal.    LABORATORY PANEL:   CBC Recent Labs  Lab 08/24/18 1749  WBC 17.0*  HGB 13.8  HCT 44.3  PLT 218   ------------------------------------------------------------------------------------------------------------------  Chemistries  Recent Labs  Lab 08/24/18 1749  NA 138  K 4.8  CL 102  CO2 26  GLUCOSE 224*  BUN 36*  CREATININE 1.48*  CALCIUM 8.7*  AST 20  ALT 22  ALKPHOS 72  BILITOT 0.6    ------------------------------------------------------------------------------------------------------------------  Cardiac Enzymes Recent Labs  Lab 08/24/18 1749  TROPONINI <0.03   ------------------------------------------------------------------------------------------------------------------  RADIOLOGY:  US Venous Img Lower Unilateral Left  Result Date: 08/24/2018 CLINICAL DATA:  Left lower extremity swelling. EXAM: Left LOWER EXTREMITY VENOUS DOPPLER ULTRASOUND TECHNIQUE: Gray-scale sonography with graded compression, as well as color Doppler and duplex ultrasound were performed to evaluate the lower extremity deep venous systems from the level of the common femoral vein and including the common femoral, femoral, profunda femoral, popliteal and calf veins including the posterior tibial, peroneal and gastrocnemius veins when visible. The superficial great saphenous vein was also interrogated. Spectral Doppler was utilized to evaluate flow at rest and with distal augmentation maneuvers in the common femoral, femoral and popliteal veins. COMPARISON:  None. FINDINGS: Contralateral Common Femoral Vein: Respiratory phasicity is normal and symmetric with the symptomatic side. No evidence of thrombus. Normal compressibility. Common Femoral Vein: No evidence of thrombus. Normal compressibility, respiratory phasicity and response to augmentation. Saphenofemoral Junction: No evidence of thrombus. Normal compressibility and flow on color Doppler imaging. Profunda Femoral Vein: No evidence of thrombus. Normal compressibility and flow on color Doppler imaging. Femoral Vein: No evidence of thrombus. Normal compressibility, respiratory phasicity and response to augmentation. Popliteal Vein: No evidence of thrombus. Normal compressibility, respiratory phasicity and response to augmentation. Calf Veins: No evidence of thrombus. Normal compressibility and flow on color Doppler imaging. Venous Reflux:  None. Other  Findings:  Edema is noted in the subcutaneous tissues. IMPRESSION: No definite evidence of deep venous thrombosis seen in the left lower extremity. Electronically Signed   By: Marijo Conception M.D.   On: 08/24/2018 19:51   Dg Chest Portable 1 View  Result Date: 08/24/2018 CLINICAL DATA:  Shortness of breath. EXAM: PORTABLE CHEST 1 VIEW COMPARISON:  Radiograph of Aug 11, 2018. FINDINGS: Stable cardiomegaly. Atherosclerosis of thoracic aorta is noted. Increased reticular densities are noted throughout both lungs concerning for worsening edema or possibly atypical inflammation superimposed upon chronic interstitial disease. No pneumothorax or pleural effusion is noted. Bony thorax is unremarkable. IMPRESSION: Stable cardiomegaly. Increased reticular densities are noted throughout both lungs concerning for superimposed edema or atypical inflammation. Aortic Atherosclerosis (ICD10-I70.0). Electronically Signed   By: Marijo Conception M.D.   On: 08/24/2018 17:44   Dg Foot 2 Views Left  Result Date: 08/24/2018 CLINICAL DATA:  Infection. EXAM: LEFT FOOT - 2 VIEW COMPARISON:  MRI and radiographs of June 09, 2018. 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 Soft tissue gas is noted in this area which may represent postsurgical changes or possibly infection. No definite lytic destruction is seen to suggest osteomyelitis currently. Mild posterior calcaneal spurring is noted. 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 Soft tissue gas is noted in this area which may represent postsurgical changes or possibly infection. No definite evidence of osteomyelitis. Electronically Signed   By: Marijo Conception M.D.   On: 08/24/2018  18:20    EKG:   Orders placed or performed during the hospital encounter of 08/24/18  . ED EKG  . ED EKG  . ED EKG  . ED EKG  . EKG 12-Lead  . EKG 12-Lead    IMPRESSION AND PLAN:   Principal Problem:   Sepsis ([place]) -lactic acid mildly elevated, judicious fluids given in the ED, will continue checking lactic acid until within normal limits.  Blood pressure has been stable.  Sepsis is due to pneumonia as below.  Cultures sent and IV antibiotics given Active Problems:   HCAP (healthcare-associated pneumonia) -IV antibiotics as above, other supportive treatment PRN   Essential hypertension -home dose antihypertensives   Coronary artery disease of native artery of native heart with stable angina pectoris ([place]) -continue home meds   Chronic atrial fibrillation -home dose rate controlling medications and anticoagulation   Chronic diastolic CHF (congestive heart failure) ([place]) -patient potentially has some mild edema on chest x-ray, though his BNP tonight is at a level consistent with prior values in her system.  He potentially has some mild exacerbation of his heart failure, which would likely be related to his sepsis.  However, I will hold diuresis at this point given his elevated lactic acid.  If patient is not improving he may warrant cardiology consult for the same.   Diabetes type 2, controlled ([place]) -sliding scale insulin   Chronic obstructive pulmonary disease ([place]) -continue home inhalers   Hyperlipidemia -home dose antilipid   BPH (benign prostatic hyperplasia) -continue home meds   Anxiety -home dose anxiolytic   Acquired hypothyroidism -home dose thyroid replacement   Chart review performed and case discussed with ED provider. Labs, imaging and/or ECG reviewed by provider and discussed with patient/family. Management plans discussed with the patient and/or family.  COVID-19 status: Tested negative     DVT PROPHYLAXIS: Systemic anticoagulation  GI PROPHYLAXIS:  None  ADMISSION STATUS: Inpatient     CODE STATUS: Full Code Status History    Date Active Date Inactive Code Status Order ID Comments User Context   06/09/2018 1855 06/16/2018 1756 Full Code [id number]   Demetrios Loll, MD Inpatient   04/17/2018 1341 04/18/2018 1749 Full Code [id number]  Hillary Bow, MD ED   12/22/2017 0613 12/26/2017 2049 Full Code [id number]  [name], MD Inpatient   10/10/2016 1520 10/11/2016 1829 Full Code [id number]  Demetrios Loll, MD Inpatient   10/10/2014 0258 10/11/2014 1704 Full Code [id number]  Harrie Foreman, MD Inpatient      TOTAL TIME TAKING CARE OF THIS PATIENT: 45 minutes.   This patient was evaluated in the context of the global COVID-19 pandemic, which necessitated consideration that the patient might be at risk for infection with the SARS-CoV-2 virus that causes COVID-19. Institutional protocols and algorithms that pertain to the evaluation of patients at risk for COVID-19 are in a state of rapid change based on information released by regulatory bodies including the CDC and federal and state organizations. These policies and algorithms were followed to the best of this provider's knowledge to date during the patient's care at this facility.  Ethlyn Daniels 08/24/2018, 10:33 PM  [facility]  Office  [phone]  CC: Primary care physician; Jearld Fenton, NP  Note:  This document was prepared using Dragon voice recognition software and may include unintentional dictation errors.

## 2018-08-24 NOTE — Telephone Encounter (Signed)
Pts daughter calling, pt present during call. States EMS called for pt, pt refused to go to ED.  Multiple issues; SOB, UTI,non-healing wound, BS low this AM, diarrhea, states hands and feet are purple. Daughter states called EMS as pt was "Jerking all over and gasping for air." Also had several episodes yesterday. States BS 60 this am. States is on 2 antibiotics. States has nurse in 3 days week for arm wound, States drainage green. Urine with "Horrible odor."  States EMS reported BP 118/68, BS 275. Daughter states had temp yesterday and tues, does not recall what it was.  TN spoke to patient to reiterate need for ED eval. Pt speaking in short phrases. Pt states he is feeling better and will go if he feels worse. Daughter requesting call from Avie Echevaria or other physician at practice stating "I think he'll go if he hears from them."  Assured TN would alert practice to situation. Attempted to reach practice x 2, also skyped Rena.  Please advise: [phone]  Reason for Disposition . Nursing judgment  Protocols used: NO GUIDELINE OR REFERENCE AVAILABLE-A-AH

## 2018-08-24 NOTE — ED Provider Notes (Signed)
[facility] Emergency Department Provider Note  ____________________________________________  Time seen: Approximately 6:39 PM  I have reviewed the triage vital signs and the nursing notes.   HISTORY  Chief Complaint Shortness of Breath   HPI Shawn Geraldo. is a 85 y.o. male with a history of CAD, diastolic CHF chronic, A. fib on Eliquis, CVA x2, hypertension, hyperlipidemia, diabetic foot infection status post amputation of his toe who presents for evaluation of several complaints.  Patient is currently on 2 antibiotics 1 for UTI and 1 for cellulitis of his left lower extremity.  Has had 2 episodes of diarrhea 1 yesterday and 1 today.  Has had some nausea but no vomiting.  Earlier today felt short of breath however that resolved after he took a nebulizer treatment which he takes 3 times a day.  Has had a mild dry cough.  Daughter reports subjective fevers at home.  Patient reports night sweats and chills.  He denies any shortness of breath or chest pain at this time.  He denies abdominal pain.  Past Medical History:  Diagnosis Date   Arthritis    CAD    a. MI 01/29/1996 tx'd w/ TPA @ [place]; b. Myoview 06/2005: EF 50%, scar @ apex, mild peri-infarct ischemia   Cancer ([place])    skin   Chronic atrial fibrillation    a. since 2006; b. on warfarin   Chronic diastolic CHF (congestive heart failure) ([place])    a. echo 04/2006: EF lower limits of nl, mod LVH, mild aortic root dilatation, & mild MR, biatrial enlargement; b. echo 04/2013: EF 60%, mod dilated LA, mild MR & TR, mod pulm HTN w/ RV systolic pressure 53, c. echo 04/21/14: EF 55-60%, unable to exclude WMA, severely dilated LA 6.6 cm, nl RVSP, mildly dilated aortic root   COPD (chronic obstructive pulmonary disease) (HCC)    oxygen prn at home   CVA [id number]   x2   DM    Falls    GERD (gastroesophageal reflux disease)    History of kidney stones    HYPERLIPIDEMIA    HYPERTENSION    Kidney  stone    a. s/p left ureteral stenting 04/24/14   Left arm weakness    limited movement. S/P fall injury   Neuropathy of both feet    Poor balance    Wears dentures    full upper and lower    Patient Active Problem List   Diagnosis Date Noted   HCAP (healthcare-associated pneumonia) 08/24/2018   Diabetic infection of left foot ([place]) 06/09/2018   Rash and nonspecific skin eruption 11/18/2017   Acquired hypothyroidism 11/22/2016   Osteoarthritis, knee 11/22/2016   Diabetes type 2, controlled ([place]) 06/13/2015   Sepsis ([place]) 10/10/2014   BPH (benign prostatic hyperplasia) 05/14/2014   Anxiety 05/14/2014   Chronic obstructive pulmonary disease ([place]) 05/14/2014   Chronic diastolic CHF (congestive heart failure) ([place])    Insomnia 08/07/2013   Hyperlipidemia 02/26/2009   MITRAL REGURGITATION 02/26/2009   Essential hypertension 02/26/2009   MI 02/26/2009   Coronary artery disease of native artery of native heart with stable angina pectoris ([place]) 02/26/2009   Chronic atrial fibrillation 02/26/2009   CVA (cerebral vascular accident) ([place]) 02/26/2009    Past Surgical History:  Procedure Laterality Date   AMPUTATION TOE Left 06/13/2018   Procedure: 1st Ray Resection Left;  Surgeon: Sharlotte Alamo, DPM;  Location: [facility];  Service: Podiatry;  Laterality: Left;   BLADDER SURGERY  stent placement    [place]   CATARACT EXTRACTION W/PHACO Left 10/11/2017   Procedure: CATARACT EXTRACTION PHACO AND INTRAOCULAR LENS PLACEMENT ([place]) COMPLICATED LEFT DIABETIC;  Surgeon: Leandrew Koyanagi, MD;  Location: [facility];  Service: Ophthalmology;  Laterality: Left;  MALYUGIN Diabetic - insulin   CATARACT EXTRACTION W/PHACO Right 11/30/2017   Procedure: CATARACT EXTRACTION PHACO AND INTRAOCULAR LENS PLACEMENT ([place]) COMPLICATED  RIGHT DIABETIC;  Surgeon: Leandrew Koyanagi, MD;  Location: [facility];  Service: Ophthalmology;  Laterality: Right;  Diabetic - insulin   CIRCUMCISION  2016   CORONARY ANGIOPLASTY  1997   s/p stent placement x 2    CYSTOSCOPY W/ URETERAL STENT REMOVAL Left 10/09/2014   Procedure: CYSTOSCOPY WITH STENT REMOVAL;  Surgeon: Hollice Espy, MD;  Location: [facility];  Service: Urology;  Laterality: Left;   CYSTOSCOPY WITH STENT PLACEMENT Left 10/09/2014   Procedure: CYSTOSCOPY WITH STENT PLACEMENT;  Surgeon: Hollice Espy, MD;  Location: [facility];  Service: Urology;  Laterality: Left;   KIDNEY SURGERY  05/2013   s/p stent placement    LOWER EXTREMITY ANGIOGRAPHY Left 06/12/2018   Procedure: Lower Extremity Angiography;  Surgeon: Algernon Huxley, MD;  Location: [facility];  Service: Cardiovascular;  Laterality: Left;   stents ureters Bilateral    TONSILLECTOMY AND ADENOIDECTOMY  1959   URETEROSCOPY WITH HOLMIUM LASER LITHOTRIPSY Left 10/09/2014   Procedure: URETEROSCOPY WITH HOLMIUM LASER LITHOTRIPSY;  Surgeon: Hollice Espy, MD;  Location: [facility];  Service: Urology;  Laterality: Left;    Prior to Admission medications   Medication Sig Start Date End Date Taking? Authorizing Provider  acetaminophen (TYLENOL) 650 MG CR tablet Take 650 mg by mouth every 8 (eight) hours as needed for pain.    Yes [provider]  albuterol (PROAIR HFA) 108 (90 Base) MCG/ACT inhaler USE 1 TO 2 INHALATIONS EVERY 4 HOURS AS NEEDED Patient taking differently: Inhale 1-2 puffs into the lungs every 4 (four) hours as needed for wheezing or shortness of breath.  11/09/17  Yes Wilhelmina Mcardle, MD  ALPRAZolam Duanne Moron) 0.25 MG tablet Take 1 tablet (0.25 mg total) by mouth at bedtime as needed for anxiety. 07/19/18  Yes Baity, Coralie Keens, NP  apixaban (ELIQUIS) 2.5 MG TABS tablet Take 1 tablet (2.5 mg total) by mouth 2 (two) times daily. 03/27/18  Yes Minna Merritts, MD  atorvastatin (LIPITOR) 40 MG tablet TAKE 1 TABLET DAILY Patient taking differently: Take 40 mg by mouth  daily at 6 PM.  07/17/18  Yes Baity, Coralie Keens, NP  cephALEXin (KEFLEX) 500 MG capsule Take 500 mg by mouth 3 (three) times daily.  08/08/18 08/28/18 Yes [provider]  ciprofloxacin (CIPRO) 250 MG tablet Take 1 tablet (250 mg total) by mouth 2 (two) times daily. 08/22/18  Yes Jearld Fenton, NP  citalopram (CELEXA) 10 MG tablet TAKE 1 TABLET DAILY 07/18/18  Yes Baity, Coralie Keens, NP  COMBIGAN 0.2-0.5 % ophthalmic solution Place 1 drop into both eyes daily. 06/26/18  Yes [provider]  dorzolamide (TRUSOPT) 2 % ophthalmic solution Place 1 drop into both eyes 3 (three) times daily.  03/31/16  Yes [provider]  ezetimibe (ZETIA) 10 MG tablet TAKE 1 TABLET DAILY 07/17/18  Yes Baity, Coralie Keens, NP  finasteride (PROSCAR) 5 MG tablet TAKE 1 TABLET DAILY Patient taking differently: Take 5 mg by mouth daily.  06/29/17  Yes Jearld Fenton, NP  gabapentin (NEURONTIN) 100 MG capsule TAKE 1 CAPSULE THREE TIMES A DAY Patient taking differently: Take 100 mg by mouth at bedtime.  06/09/18  Yes Baity, Coralie Keens, NP  insulin detemir (LEVEMIR) 100 UNIT/ML injection Inject 0.28 mLs (28 Units total) into the skin daily at 10 pm. Patient taking differently: Inject 42 Units into the skin daily at 10 pm.  06/15/18  Yes Fritzi Mandes, MD  insulin lispro (HUMALOG) 100 UNIT/ML injection Inject 0.04 mLs (4 Units total) into the skin 3 (three) times daily with meals. Patient taking differently: Inject 10-18 Units into the skin 3 (three) times daily with meals. 18 am, 3 lunch, 10 supper 06/15/18  Yes Fritzi Mandes, MD  latanoprost (XALATAN) 0.005 % ophthalmic solution Place 1 drop into both eyes at bedtime.  04/18/16  Yes [provider]  levothyroxine (SYNTHROID, LEVOTHROID) 50 MCG tablet TAKE 1 TABLET DAILY BEFORE BREAKFAST 06/27/18  Yes Baity, Coralie Keens, NP  metoprolol succinate (TOPROL-XL) 50 MG 24 hr tablet TAKE 1 TABLET TWICE A DAY 06/09/18  Yes Baity, Coralie Keens, NP  potassium chloride SA (K-DUR,KLOR-CON)  20 MEQ tablet Take 4 tablets (80 meq) by mouth twice daily, take an extra 1 tablet (20 meq) on the days you take metolazone Patient taking differently: Take 80 mEq by mouth 2 (two) times daily. Take 4 tablets (80 meq) by mouth twice daily at breakfast and at lunch, take an extra 1 tablet (20 meq) on the days you take metolazone put tablet in applesauce to aid swallowing 02/10/18  Yes Gollan, Kathlene November, MD  predniSONE (DELTASONE) 10 MG tablet Take 1 tablet (10 mg total) by mouth daily with breakfast. 05/05/18  Yes Baity, Coralie Keens, NP  SYMBICORT 160-4.5 MCG/ACT inhaler USE 2 INHALATIONS TWICE A DAY Patient taking differently: Inhale 1 puff into the lungs 2 (two) times daily.  08/31/17  Yes Wilhelmina Mcardle, MD  tamsulosin (FLOMAX) 0.4 MG CAPS capsule TAKE 1 CAPSULE DAILY 07/18/18  Yes Jearld Fenton, NP  torsemide (DEMADEX) 20 MG tablet Take 40 mg by mouth 2 (two) times daily.   Yes [provider]  traZODone (DESYREL) 50 MG tablet Take 2 tablets (100 mg total) by mouth at bedtime as needed for sleep. 02/16/18  Yes Baity, Coralie Keens, NP  umeclidinium bromide (INCRUSE ELLIPTA) 62.5 MCG/INH AEPB Inhale 1 puff into the lungs daily.   Yes [provider]  clotrimazole (LOTRIMIN) 1 % cream Apply 1 application topically 2 (two) times daily. 11/18/17   Bedsole, Amy E, MD  COLCRYS 0.6 MG tablet TAKE 1 TABLET TWICE A DAY AS NEEDED (ACUTE GOUT FLARE) USE AS DIRECTED 07/06/18   Jearld Fenton, NP  diclofenac sodium (VOLTAREN) 1 % GEL Apply 4 g topically 4 (four) times daily. 01/05/18   Copland, Frederico Hamman, MD  glipiZIDE (GLUCOTROL) 5 MG tablet TAKE 1 TABLET TWICE A DAY BEFORE MEALS Patient not taking: Reported on 08/24/2018 07/31/18   Jearld Fenton, NP  Insulin Pen Needle (BD PEN NEEDLE NANO U/F) 32G X 4 MM MISC USE THREE TIMES A DAY FOR INSULIN ADMINISTRATION 02/15/18   Jearld Fenton, NP  Insulin Syringe-Needle U-100 30G X 1/2" 1 ML MISC 1 each by Does not apply route 3 (three) times daily. 07/19/14   Jearld Fenton, NP  metolazone (ZAROXOLYN) 5 MG tablet Take 1 tablet (5 mg total) by mouth daily as needed (swelling). 03/13/18   Minna Merritts, MD  montelukast (SINGULAIR) 10 MG tablet TAKE 1 TABLET AT BEDTIME Patient  taking differently: Take 10 mg by mouth at bedtime.  02/24/18   Jearld Fenton, NP  nitroGLYCERIN (NITROSTAT) 0.4 MG SL tablet Place 0.4 mg under the tongue every 5 (five) minutes as needed.      [provider]  nystatin-triamcinolone (MYCOLOG II) cream Apply 1 application topically 2 (two) times daily.  03/12/14   [provider]  traMADol (ULTRAM) 50 MG tablet Take 1 tablet (50 mg total) by mouth 2 (two) times daily. 06/29/18   Jearld Fenton, NP    Allergies Contrast media [iodinated diagnostic agents]; Morphine and related; Niacin and related; Other; and Amlodipine  Family History  Problem Relation Age of Onset   Heart disease Mother    Heart disease Maternal Grandmother    Diabetes Maternal Grandmother    Cancer Neg Hx    Stroke Neg Hx     Social History Social History   Tobacco Use   Smoking status: Former Smoker    Packs/day: 2.00    Years: 40.00    Pack years: 80.00    Types: Cigarettes    Last attempt to quit: 05/24/1990    Years since quitting: 28.2   Smokeless tobacco: Former Systems developer    Quit date: 05/24/1990  Substance Use Topics   Alcohol use: No   Drug use: No    Review of Systems  Constitutional: + fever, chills, night sweats Eyes: Negative for visual changes. ENT: Negative for sore throat. Neck: No neck pain  Cardiovascular: Negative for chest pain. Respiratory: + shortness of breath, cough Gastrointestinal: Negative for abdominal pain, vomiting. + diarrhea. Genitourinary: Negative for dysuria. Musculoskeletal: Negative for back pain. Skin: Negative for rash. Neurological: Negative for headaches, weakness or numbness. Psych: No SI or HI  ____________________________________________   PHYSICAL EXAM:  VITAL  SIGNS: ED Triage Vitals  Enc Vitals Group     BP 08/24/18 1709 124/82     Pulse Rate 08/24/18 1709 100     Resp 08/24/18 1709 (!) 24     Temp 08/24/18 1709 98.7 F (37.1 C)     Temp Source 08/24/18 1709 Oral     SpO2 08/24/18 1709 100 %     Weight 08/24/18 1717 217 lb 9.5 oz (98.7 kg)     Height 08/24/18 1717 5\' 6"  (1.676 m)     Head Circumference --      Peak Flow --      Pain Score 08/24/18 1710 5     Pain Loc --      Pain Edu? --      Excl. in [place]? --     Constitutional: Alert and oriented. Well appearing and in no apparent distress. HEENT:      Head: Normocephalic and atraumatic.         Eyes: Conjunctivae are normal. Sclera is non-icteric.       Mouth/Throat: Mucous membranes are moist.       Neck: Supple with no signs of meningismus. Cardiovascular: Regular rate and rhythm. No murmurs, gallops, or rubs. 2+ symmetrical distal pulses are present in all extremities. No JVD. Respiratory: Normal respiratory effort. Lungs are clear to auscultation bilaterally. No wheezes, crackles, or rhonchi.  Gastrointestinal: Obese, non tender, and non distended with positive bowel sounds. No rebound or guarding. Musculoskeletal: Patient has amputation of the left big toe with 1+ pitting edema and erythema of the left lower extremity there is no warmth, there is one intact blister, there is no crepitus or necrosis, there is no purulent discharge Neurologic:  Normal speech and language. Face is symmetric. Moving all extremities. No gross focal neurologic deficits are appreciated. Skin: Skin is warm, dry and intact. No rash noted. Psychiatric: Mood and affect are normal. Speech and behavior are normal.  ____________________________________________   LABS (all labs ordered are listed, but only abnormal results are displayed)  Labs Reviewed  CBC WITH DIFFERENTIAL/PLATELET - Abnormal; Notable for the following components:      Result Value   WBC 17.0 (*)    RDW 16.8 (*)    Neutro Abs 15.0 (*)     Monocytes Absolute 1.1 (*)    Abs Immature Granulocytes 0.17 (*)    All other components within normal limits  COMPREHENSIVE METABOLIC PANEL - Abnormal; Notable for the following components:   Glucose, Bld 224 (*)    BUN 36 (*)    Creatinine, Ser 1.48 (*)    Calcium 8.7 (*)    Albumin 3.0 (*)    GFR calc non Af Amer 43 (*)    GFR calc Af Amer 50 (*)    All other components within normal limits  LACTIC ACID, PLASMA - Abnormal; Notable for the following components:   Lactic Acid, Venous 2.3 (*)    All other components within normal limits  LACTIC ACID, PLASMA - Abnormal; Notable for the following components:   Lactic Acid, Venous 2.1 (*)    All other components within normal limits  PROTIME-INR - Abnormal; Notable for the following components:   Prothrombin Time 16.6 (*)    INR 1.4 (*)    All other components within normal limits  URINALYSIS, COMPLETE (UACMP) WITH MICROSCOPIC - Abnormal; Notable for the following components:   Color, Urine STRAW (*)    APPearance CLEAR (*)    Glucose, UA 50 (*)    Hgb urine dipstick SMALL (*)    Leukocytes,Ua TRACE (*)    All other components within normal limits  BRAIN NATRIURETIC PEPTIDE - Abnormal; Notable for the following components:   B Natriuretic Peptide 317.0 (*)    All other components within normal limits  SARS CORONAVIRUS 2 (HOSPITAL ORDER, [place])  CULTURE, BLOOD (ROUTINE X 2)  CULTURE, BLOOD (ROUTINE X 2)  TROPONIN I  URINALYSIS, ROUTINE W REFLEX MICROSCOPIC   ____________________________________________  EKG   ED ECG REPORT I, Rudene Re, the attending physician, personally viewed and interpreted this ECG.  Atrial fibrillation with rate of 108, normal QTC, normal axis, T wave inversions in lateral leads with no ST elevation.  Unchanged from prior ____________________________________________  RADIOLOGY  I have personally reviewed the images performed during this visit and I agree  with the Radiologist's read.   Interpretation by Radiologist:  US Venous Img Lower Unilateral Left  Result Date: 08/24/2018 CLINICAL DATA:  Left lower extremity swelling. EXAM: Left LOWER EXTREMITY VENOUS DOPPLER ULTRASOUND TECHNIQUE: Gray-scale sonography with graded compression, as well as color Doppler and duplex ultrasound were performed to evaluate the lower extremity deep venous systems from the level of the common femoral vein and including the common femoral, femoral, profunda femoral, popliteal and calf veins including the posterior tibial, peroneal and gastrocnemius veins when visible. The superficial great saphenous vein was also interrogated. Spectral Doppler was utilized to evaluate flow at rest and with distal augmentation maneuvers in the common femoral, femoral and popliteal veins. COMPARISON:  None. FINDINGS: Contralateral Common Femoral Vein: Respiratory phasicity is normal and symmetric with the symptomatic side. No evidence of thrombus. Normal compressibility. Common Femoral Vein: No evidence of  thrombus. Normal compressibility, respiratory phasicity and response to augmentation. Saphenofemoral Junction: No evidence of thrombus. Normal compressibility and flow on color Doppler imaging. Profunda Femoral Vein: No evidence of thrombus. Normal compressibility and flow on color Doppler imaging. Femoral Vein: No evidence of thrombus. Normal compressibility, respiratory phasicity and response to augmentation. Popliteal Vein: No evidence of thrombus. Normal compressibility, respiratory phasicity and response to augmentation. Calf Veins: No evidence of thrombus. Normal compressibility and flow on color Doppler imaging. Venous Reflux:  None. Other Findings:  Edema is noted in the subcutaneous tissues. IMPRESSION: No definite evidence of deep venous thrombosis seen in the left lower extremity. Electronically Signed   By: Marijo Conception M.D.   On: 08/24/2018 19:51   Dg Chest Portable 1  View  Result Date: 08/24/2018 CLINICAL DATA:  Shortness of breath. EXAM: PORTABLE CHEST 1 VIEW COMPARISON:  Radiograph of Aug 11, 2018. FINDINGS: Stable cardiomegaly. Atherosclerosis of thoracic aorta is noted. Increased reticular densities are noted throughout both lungs concerning for worsening edema or possibly atypical inflammation superimposed upon chronic interstitial disease. No pneumothorax or pleural effusion is noted. Bony thorax is unremarkable. IMPRESSION: Stable cardiomegaly. Increased reticular densities are noted throughout both lungs concerning for superimposed edema or atypical inflammation. Aortic Atherosclerosis (ICD10-I70.0). Electronically Signed   By: Marijo Conception M.D.   On: 08/24/2018 17:44   Dg Foot 2 Views Left  Result Date: 08/24/2018 CLINICAL DATA:  Infection. EXAM: LEFT FOOT - 2 VIEW COMPARISON:  MRI and radiographs of June 09, 2018. 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 Soft tissue gas is noted in this area which may represent postsurgical changes or possibly infection. No definite lytic destruction is seen to suggest osteomyelitis currently. Mild posterior calcaneal spurring is noted. 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 Soft tissue gas is noted in this area which may represent postsurgical changes or possibly infection. No definite evidence of osteomyelitis. Electronically Signed   By: Marijo Conception M.D.   On: 08/24/2018 18:20     ____________________________________________   PROCEDURES  Procedure(s) performed: None Procedures Critical Care performed: yes  CRITICAL CARE Performed by: Rudene Re  ?  Total critical care time: 30 min  Critical care time was exclusive of separately billable procedures and treating other patients.  Critical care was necessary to treat or prevent imminent or life-threatening deterioration.  Critical  care was time spent personally by me on the following activities: development of treatment plan with patient and/or surrogate as well as nursing, discussions with consultants, evaluation of patient's response to treatment, examination of patient, obtaining history from patient or surrogate, ordering and performing treatments and interventions, ordering and review of laboratory studies, ordering and review of radiographic studies, pulse oximetry and re-evaluation of patient's condition.  ____________________________________________   INITIAL IMPRESSION / ASSESSMENT AND PLAN / ED COURSE   85 y.o. male with a history of CAD, diastolic CHF chronic, A. fib on Eliquis, CVA x2, hypertension, hyperlipidemia, diabetic foot infection status post amputation of his toe who presents for evaluation of several complaints.   #Night sweats, fever: Possibly sepsis versus infection.  The leg does look erythematous however it is not hot to the touch therefore I do not think patient has cellulitis.   Doppler studies negative for DVT. Possibly urosepsis or pneumonia since patient also has had a cough.  Chest x-ray is concerning for possible pulmonary edema versus pneumonia.  White count is elevated at 17.  Will treat for pneumonia at this time and possible sepsis with tachycardia, elevated lactic, elevated white count, and elevated respiratory rate.  Will give Rocephin and azithromycin.  Discussed diagnoses and plan with patient and his daughter over the phone      As part of my medical decision making, I reviewed the following data within the [facility] notes reviewed and incorporated, Labs reviewed , EKG interpreted , Old EKG reviewed, Old chart reviewed, Radiograph reviewed , Discussed with admitting physician , Notes from prior ED visits and [place] Controlled Substance Database    Pertinent labs & imaging results that were available during my care of the patient were reviewed by me and  considered in my medical decision making (see chart for details).    ____________________________________________   FINAL CLINICAL IMPRESSION(S) / ED DIAGNOSES  Final diagnoses:  Sepsis without acute organ dysfunction, due to unspecified organism [facility])  Community acquired pneumonia, unspecified laterality      NEW MEDICATIONS STARTED DURING THIS VISIT:  ED Discharge Orders    None       Note:  This document was prepared using Dragon voice recognition software and may include unintentional dictation errors.    Alfred Levins, [place], MD 08/24/18 2234

## 2018-08-24 NOTE — Telephone Encounter (Signed)
I spoke to pt and he states he was able to pick up the Keflex and will continue the Cipro 250mg ...

## 2018-08-25 ENCOUNTER — Other Ambulatory Visit: Payer: Self-pay

## 2018-08-25 DIAGNOSIS — M1990 Unspecified osteoarthritis, unspecified site: Secondary | ICD-10-CM | POA: Diagnosis present

## 2018-08-25 DIAGNOSIS — E039 Hypothyroidism, unspecified: Secondary | ICD-10-CM | POA: Diagnosis present

## 2018-08-25 DIAGNOSIS — E1151 Type 2 diabetes mellitus with diabetic peripheral angiopathy without gangrene: Secondary | ICD-10-CM | POA: Diagnosis present

## 2018-08-25 DIAGNOSIS — J189 Pneumonia, unspecified organism: Secondary | ICD-10-CM | POA: Diagnosis present

## 2018-08-25 DIAGNOSIS — A419 Sepsis, unspecified organism: Secondary | ICD-10-CM | POA: Diagnosis present

## 2018-08-25 DIAGNOSIS — Z7901 Long term (current) use of anticoagulants: Secondary | ICD-10-CM | POA: Diagnosis not present

## 2018-08-25 DIAGNOSIS — E785 Hyperlipidemia, unspecified: Secondary | ICD-10-CM | POA: Diagnosis present

## 2018-08-25 DIAGNOSIS — I110 Hypertensive heart disease with heart failure: Secondary | ICD-10-CM | POA: Diagnosis present

## 2018-08-25 DIAGNOSIS — N390 Urinary tract infection, site not specified: Secondary | ICD-10-CM | POA: Diagnosis present

## 2018-08-25 DIAGNOSIS — J440 Chronic obstructive pulmonary disease with acute lower respiratory infection: Secondary | ICD-10-CM | POA: Diagnosis present

## 2018-08-25 DIAGNOSIS — N400 Enlarged prostate without lower urinary tract symptoms: Secondary | ICD-10-CM | POA: Diagnosis present

## 2018-08-25 DIAGNOSIS — Z9842 Cataract extraction status, left eye: Secondary | ICD-10-CM | POA: Diagnosis not present

## 2018-08-25 DIAGNOSIS — I482 Chronic atrial fibrillation, unspecified: Secondary | ICD-10-CM | POA: Diagnosis present

## 2018-08-25 DIAGNOSIS — Z87442 Personal history of urinary calculi: Secondary | ICD-10-CM | POA: Diagnosis not present

## 2018-08-25 DIAGNOSIS — Z89422 Acquired absence of other left toe(s): Secondary | ICD-10-CM | POA: Diagnosis not present

## 2018-08-25 DIAGNOSIS — K219 Gastro-esophageal reflux disease without esophagitis: Secondary | ICD-10-CM | POA: Diagnosis present

## 2018-08-25 DIAGNOSIS — I5032 Chronic diastolic (congestive) heart failure: Secondary | ICD-10-CM | POA: Diagnosis present

## 2018-08-25 DIAGNOSIS — Y95 Nosocomial condition: Secondary | ICD-10-CM | POA: Diagnosis present

## 2018-08-25 DIAGNOSIS — F419 Anxiety disorder, unspecified: Secondary | ICD-10-CM | POA: Diagnosis present

## 2018-08-25 DIAGNOSIS — L03116 Cellulitis of left lower limb: Secondary | ICD-10-CM | POA: Diagnosis present

## 2018-08-25 DIAGNOSIS — Z888 Allergy status to other drugs, medicaments and biological substances status: Secondary | ICD-10-CM | POA: Diagnosis not present

## 2018-08-25 DIAGNOSIS — Z20828 Contact with and (suspected) exposure to other viral communicable diseases: Secondary | ICD-10-CM | POA: Diagnosis present

## 2018-08-25 DIAGNOSIS — Z91041 Radiographic dye allergy status: Secondary | ICD-10-CM | POA: Diagnosis not present

## 2018-08-25 DIAGNOSIS — Z8673 Personal history of transient ischemic attack (TIA), and cerebral infarction without residual deficits: Secondary | ICD-10-CM | POA: Diagnosis not present

## 2018-08-25 DIAGNOSIS — Z9841 Cataract extraction status, right eye: Secondary | ICD-10-CM | POA: Diagnosis not present

## 2018-08-25 LAB — CBC
HCT: 41.4 % (ref 39.0–52.0)
Hemoglobin: 12.8 g/dL — ABNORMAL LOW (ref 13.0–17.0)
MCH: 28.8 pg (ref 26.0–34.0)
MCHC: 30.9 g/dL (ref 30.0–36.0)
MCV: 93.2 fL (ref 80.0–100.0)
Platelets: 215 10*3/uL (ref 150–400)
RBC: 4.44 MIL/uL (ref 4.22–5.81)
RDW: 16.9 % — ABNORMAL HIGH (ref 11.5–15.5)
WBC: 13.6 10*3/uL — ABNORMAL HIGH (ref 4.0–10.5)
nRBC: 0 % (ref 0.0–0.2)

## 2018-08-25 LAB — BASIC METABOLIC PANEL
Anion gap: 8 (ref 5–15)
BUN: 30 mg/dL — ABNORMAL HIGH (ref 8–23)
CO2: 24 mmol/L (ref 22–32)
Calcium: 8.5 mg/dL — ABNORMAL LOW (ref 8.9–10.3)
Chloride: 107 mmol/L (ref 98–111)
Creatinine, Ser: 1.29 mg/dL — ABNORMAL HIGH (ref 0.61–1.24)
GFR calc Af Amer: 59 mL/min — ABNORMAL LOW (ref >60)
GFR calc non Af Amer: 51 mL/min — ABNORMAL LOW (ref >60)
Glucose, Bld: 106 mg/dL — ABNORMAL HIGH (ref 70–99)
Potassium: 4 mmol/L (ref 3.5–5.1)
Sodium: 139 mmol/L (ref 135–145)

## 2018-08-25 LAB — GLUCOSE, CAPILLARY
Glucose-Capillary: 101 mg/dL — ABNORMAL HIGH (ref 70–99)
Glucose-Capillary: 192 mg/dL — ABNORMAL HIGH (ref 70–99)
Glucose-Capillary: 156 mg/dL — ABNORMAL HIGH (ref 70–99)
Glucose-Capillary: 177 mg/dL — ABNORMAL HIGH (ref 70–99)

## 2018-08-25 LAB — MRSA PCR SCREENING: MRSA by PCR: NEGATIVE

## 2018-08-25 LAB — LACTIC ACID, PLASMA: Lactic Acid, Venous: 1.9 mmol/L (ref 0.5–1.9)

## 2018-08-25 MED ORDER — TRAZODONE HCL 100 MG PO TABS: 100.0000 mg | ORAL_TABLET | Freq: Every evening | ORAL | Status: DC | PRN

## 2018-08-25 MED ORDER — TAMSULOSIN HCL 0.4 MG PO CAPS
0.4000 mg | ORAL_CAPSULE | Freq: Every day | ORAL | Status: DC
  Administered 2018-08-25 – 2018-08-27 (×3): 0.4 mg via ORAL
  Filled 2018-08-25 (×4): qty 1

## 2018-08-25 MED ORDER — ATORVASTATIN CALCIUM 20 MG PO TABS
40.0000 mg | ORAL_TABLET | Freq: Every day | ORAL | Status: DC
  Administered 2018-08-25 – 2018-08-26 (×2): 40 mg via ORAL
  Filled 2018-08-25 (×2): qty 2

## 2018-08-25 MED ORDER — ACETAMINOPHEN 650 MG RE SUPP: 650.0000 mg | Freq: Four times a day (QID) | RECTAL | Status: DC | PRN

## 2018-08-25 MED ORDER — INSULIN ASPART 100 UNIT/ML ~~LOC~~ SOLN
0.0000 [IU] | Freq: Every day | SUBCUTANEOUS | Status: DC
  Administered 2018-08-26: 2 [IU] via SUBCUTANEOUS
  Filled 2018-08-25: qty 1

## 2018-08-25 MED ORDER — PREDNISONE 20 MG PO TABS
10.0000 mg | ORAL_TABLET | Freq: Every day | ORAL | Status: DC
  Administered 2018-08-25 – 2018-08-27 (×3): 10 mg via ORAL
  Filled 2018-08-25 (×3): qty 1

## 2018-08-25 MED ORDER — ACETAMINOPHEN 325 MG PO TABS: 650.0000 mg | ORAL_TABLET | Freq: Four times a day (QID) | ORAL | Status: DC | PRN

## 2018-08-25 MED ORDER — ONDANSETRON HCL 4 MG/2ML IJ SOLN: 4.0000 mg | Freq: Four times a day (QID) | INTRAMUSCULAR | Status: DC | PRN

## 2018-08-25 MED ORDER — FINASTERIDE 5 MG PO TABS
5.0000 mg | ORAL_TABLET | Freq: Every day | ORAL | Status: DC
  Administered 2018-08-25 – 2018-08-27 (×3): 5 mg via ORAL
  Filled 2018-08-25 (×3): qty 1

## 2018-08-25 MED ORDER — MOMETASONE FURO-FORMOTEROL FUM 200-5 MCG/ACT IN AERO
2.0000 | INHALATION_SPRAY | Freq: Two times a day (BID) | RESPIRATORY_TRACT | Status: DC
  Administered 2018-08-25 – 2018-08-27 (×5): 2 via RESPIRATORY_TRACT
  Filled 2018-08-25: qty 8.8

## 2018-08-25 MED ORDER — CITALOPRAM HYDROBROMIDE 20 MG PO TABS
10.0000 mg | ORAL_TABLET | Freq: Every day | ORAL | Status: DC
  Administered 2018-08-25 – 2018-08-27 (×3): 10 mg via ORAL
  Filled 2018-08-25 (×3): qty 1

## 2018-08-25 MED ORDER — APIXABAN 2.5 MG PO TABS
2.5000 mg | ORAL_TABLET | Freq: Two times a day (BID) | ORAL | Status: DC
  Administered 2018-08-25 – 2018-08-27 (×6): 2.5 mg via ORAL
  Filled 2018-08-25 (×6): qty 1

## 2018-08-25 MED ORDER — UMECLIDINIUM BROMIDE 62.5 MCG/INH IN AEPB
1.0000 | INHALATION_SPRAY | Freq: Every day | RESPIRATORY_TRACT | Status: DC
  Administered 2018-08-25 – 2018-08-27 (×3): 1 via RESPIRATORY_TRACT
  Filled 2018-08-25: qty 7

## 2018-08-25 MED ORDER — ONDANSETRON HCL 4 MG PO TABS: 4.0000 mg | ORAL_TABLET | Freq: Four times a day (QID) | ORAL | Status: DC | PRN

## 2018-08-25 MED ORDER — LEVOTHYROXINE SODIUM 50 MCG PO TABS
50.0000 ug | ORAL_TABLET | Freq: Every day | ORAL | Status: DC
  Administered 2018-08-25 – 2018-08-27 (×3): 50 ug via ORAL
  Filled 2018-08-25 (×3): qty 1

## 2018-08-25 MED ORDER — ALPRAZOLAM 0.25 MG PO TABS: 0.2500 mg | ORAL_TABLET | Freq: Every evening | ORAL | Status: DC | PRN

## 2018-08-25 MED ORDER — METOPROLOL SUCCINATE ER 50 MG PO TB24
50.0000 mg | ORAL_TABLET | Freq: Every day | ORAL | Status: DC
  Administered 2018-08-25 – 2018-08-27 (×3): 50 mg via ORAL
  Filled 2018-08-25 (×3): qty 1

## 2018-08-25 MED ORDER — INSULIN ASPART 100 UNIT/ML ~~LOC~~ SOLN
0.0000 [IU] | Freq: Three times a day (TID) | SUBCUTANEOUS | Status: DC
  Administered 2018-08-25 (×2): 2 [IU] via SUBCUTANEOUS
  Administered 2018-08-26: 1 [IU] via SUBCUTANEOUS
  Administered 2018-08-26: 5 [IU] via SUBCUTANEOUS
  Administered 2018-08-26: 3 [IU] via SUBCUTANEOUS
  Administered 2018-08-27: 7 [IU] via SUBCUTANEOUS
  Administered 2018-08-27: 2 [IU] via SUBCUTANEOUS
  Filled 2018-08-25 (×7): qty 1

## 2018-08-25 MED ORDER — MONTELUKAST SODIUM 10 MG PO TABS
10.0000 mg | ORAL_TABLET | Freq: Every day | ORAL | Status: DC
  Administered 2018-08-25 – 2018-08-26 (×2): 10 mg via ORAL
  Filled 2018-08-25 (×2): qty 1

## 2018-08-25 NOTE — ED Notes (Signed)
Pt resting quietly. Lactic drawn and sent to the lab.

## 2018-08-25 NOTE — Consult Note (Signed)
Pharmacy Antibiotic Note  [name]. is a 85 y.o. male admitted on 08/24/2018 with sepsis.  Pharmacy has been consulted for vancomycin and cefepime dosing. MRSA PCR negative- Vancomycin d/c 5/15  Plan: continue cefepime 2g IV every 12 hours   Height: 5\' 6"  (167.6 cm) Weight: 187 lb 6.3 oz (85 kg) IBW/kg (Calculated) : 63.8  Temp (24hrs), Avg:98.4 F (36.9 C), Min:98 F (36.7 C), Max:98.7 F (37.1 C)  Recent Labs  Lab 08/24/18 1749 08/24/18 2035 08/25/18 0012 08/25/18 0707  WBC 17.0*  --   --  13.6*  CREATININE 1.48*  --   --  1.29*  LATICACIDVEN 2.3* 2.1* 1.9  --     Estimated Creatinine Clearance: 43.6 mL/min (A) (by C-G formula based on SCr of 1.29 mg/dL (H)).    Allergies  Allergen Reactions  . Contrast Media [Iodinated Diagnostic Agents] Shortness Of Breath  . Morphine And Related Other (See Comments)    Hallucinations   . Niacin And Related Dermatitis  . Other     Other reaction(s): SHORTNESS OF BREATH  . Amlodipine Rash    Antimicrobials this admission: 5/14 azithromycin/ceftriaxone  >> x1   5/14 vancomycin  >> 5/15 5/14 Cefepime >>   Microbiology results: 5/14 BCx: pending 5/14 SARS Coronavirus 2: negative 5/15 MRSA PCR neg   Thank you for allowing pharmacy to be a part of this patient's care.  Noralee Space, PharmD, BCPS Clinical Pharmacist 08/25/2018 11:16 AM

## 2018-08-25 NOTE — Progress Notes (Signed)
[facility] NAME: [name]    MR#:  [id number]  DATE OF BIRTH:  03/03/1932  SUBJECTIVE:  CHIEF COMPLAINT:   Chief Complaint  Patient presents with  . Shortness of Breath   Came with SOB, recent foot surgery. Noted to have pneumonia.  REVIEW OF SYSTEMS:  CONSTITUTIONAL: No fever, fatigue or weakness.  EYES: No blurred or double vision.  EARS, NOSE, AND THROAT: No tinnitus or ear pain.  RESPIRATORY: have cough, shortness of breath, no wheezing or hemoptysis.  CARDIOVASCULAR: No chest pain, orthopnea, edema.  GASTROINTESTINAL: No nausea, vomiting, diarrhea or abdominal pain.  GENITOURINARY: No dysuria, hematuria.  ENDOCRINE: No polyuria, nocturia,  HEMATOLOGY: No anemia, easy bruising or bleeding. SKIN: No rash or lesion. MUSCULOSKELETAL: No joint pain or arthritis.   NEUROLOGIC: No tingling, numbness, weakness.  PSYCHIATRY: No anxiety or depression.   ROS  DRUG ALLERGIES:   Allergies  Allergen Reactions  . Contrast Media [Iodinated Diagnostic Agents] Shortness Of Breath  . Morphine And Related Other (See Comments)    Hallucinations   . Niacin And Related Dermatitis  . Other     Other reaction(s): SHORTNESS OF BREATH  . Amlodipine Rash    VITALS:  Blood pressure (!) 152/76, pulse 93, temperature 97.8 F (36.6 C), temperature source Oral, resp. rate (!) 24, height 5\' 6"  (1.676 m), weight 85 kg, SpO2 93 %.  PHYSICAL EXAMINATION:  GENERAL:  85 y.o.-year-old patient lying in the bed with no acute distress.  EYES: Pupils equal, round, reactive to light and accommodation. No scleral icterus. Extraocular muscles intact.  HEENT: Head atraumatic, normocephalic. Oropharynx and nasopharynx clear.  NECK:  Supple, no jugular venous distention. No thyroid enlargement, no tenderness.  LUNGS: Normal breath sounds bilaterally, no wheezing, have crepitation. No use of accessory muscles of respiration.  CARDIOVASCULAR: S1, S2  normal. No murmurs, rubs, or gallops.  ABDOMEN: Soft, nontender, nondistended. Bowel sounds present. No organomegaly or mass.  EXTREMITIES: No pedal edema, cyanosis, or clubbing. Have TMT amputation. NEUROLOGIC: Cranial nerves II through XII are intact. Muscle strength 5/5 in all extremities. Sensation intact. Gait not checked.  PSYCHIATRIC: The patient is alert and oriented x 3.  SKIN: No obvious rash, lesion, or ulcer.   Physical Exam LABORATORY PANEL:   CBC Recent Labs  Lab 08/25/18 0707  WBC 13.6*  HGB 12.8*  HCT 41.4  PLT 215   ------------------------------------------------------------------------------------------------------------------  Chemistries  Recent Labs  Lab 08/24/18 1749 08/25/18 0707  NA 138 139  K 4.8 4.0  CL 102 107  CO2 26 24  GLUCOSE 224* 106*  BUN 36* 30*  CREATININE 1.48* 1.29*  CALCIUM 8.7* 8.5*  AST 20  --   ALT 22  --   ALKPHOS 72  --   BILITOT 0.6  --    ------------------------------------------------------------------------------------------------------------------  Cardiac Enzymes Recent Labs  Lab 08/24/18 1749  TROPONINI <0.03   ------------------------------------------------------------------------------------------------------------------  RADIOLOGY:  US Venous Img Lower Unilateral Left  Result Date: 08/24/2018 CLINICAL DATA:  Left lower extremity swelling. EXAM: Left LOWER EXTREMITY VENOUS DOPPLER ULTRASOUND TECHNIQUE: Gray-scale sonography with graded compression, as well as color Doppler and duplex ultrasound were performed to evaluate the lower extremity deep venous systems from the level of the common femoral vein and including the common femoral, femoral, profunda femoral, popliteal and calf veins including the posterior tibial, peroneal and gastrocnemius veins when visible. The superficial great saphenous vein was also interrogated. Spectral Doppler was utilized to evaluate flow at rest  and with distal augmentation  maneuvers in the common femoral, femoral and popliteal veins. COMPARISON:  None. FINDINGS: Contralateral Common Femoral Vein: Respiratory phasicity is normal and symmetric with the symptomatic side. No evidence of thrombus. Normal compressibility. Common Femoral Vein: No evidence of thrombus. Normal compressibility, respiratory phasicity and response to augmentation. Saphenofemoral Junction: No evidence of thrombus. Normal compressibility and flow on color Doppler imaging. Profunda Femoral Vein: No evidence of thrombus. Normal compressibility and flow on color Doppler imaging. Femoral Vein: No evidence of thrombus. Normal compressibility, respiratory phasicity and response to augmentation. Popliteal Vein: No evidence of thrombus. Normal compressibility, respiratory phasicity and response to augmentation. Calf Veins: No evidence of thrombus. Normal compressibility and flow on color Doppler imaging. Venous Reflux:  None. Other Findings:  Edema is noted in the subcutaneous tissues. IMPRESSION: No definite evidence of deep venous thrombosis seen in the left lower extremity. Electronically Signed   By: Marijo Conception M.D.   On: 08/24/2018 19:51   Dg Chest Portable 1 View  Result Date: 08/24/2018 CLINICAL DATA:  Shortness of breath. EXAM: PORTABLE CHEST 1 VIEW COMPARISON:  Radiograph of Aug 11, 2018. FINDINGS: Stable cardiomegaly. Atherosclerosis of thoracic aorta is noted. Increased reticular densities are noted throughout both lungs concerning for worsening edema or possibly atypical inflammation superimposed upon chronic interstitial disease. No pneumothorax or pleural effusion is noted. Bony thorax is unremarkable. IMPRESSION: Stable cardiomegaly. Increased reticular densities are noted throughout both lungs concerning for superimposed edema or atypical inflammation. Aortic Atherosclerosis (ICD10-I70.0). Electronically Signed   By: Marijo Conception M.D.   On: 08/24/2018 17:44   Dg Foot 2 Views Left  Result  Date: 08/24/2018 CLINICAL DATA:  Infection. EXAM: LEFT FOOT - 2 VIEW COMPARISON:  MRI and radiographs of June 09, 2018. 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 Soft tissue gas is noted in this area which may represent postsurgical changes or possibly infection. No definite lytic destruction is seen to suggest osteomyelitis currently. Mild posterior calcaneal spurring is noted. 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 Soft tissue gas is noted in this area which may represent postsurgical changes or possibly infection. No definite evidence of osteomyelitis. Electronically Signed   By: Marijo Conception M.D.   On: 08/24/2018 18:20    ASSESSMENT AND PLAN:   Principal Problem:   Sepsis ([place]) Active Problems:   Hyperlipidemia   Essential hypertension   Coronary artery disease of native artery of native heart with stable angina pectoris (HCC)   Chronic atrial fibrillation   Chronic diastolic CHF (congestive heart failure) (HCC)   BPH (benign prostatic hyperplasia)   Anxiety   Diabetes type 2, controlled ([place])   Chronic obstructive pulmonary disease (HCC)   Acquired hypothyroidism   HCAP (healthcare-associated pneumonia)   * Sepsis ([place]) -lactic acid mildly elevated, ju   Blood pressure has been stable.  Sepsis is due to pneumonia as below.  Cultures sent and IV antibiotics given MRSA PCR negative- stop vanc.  *  HCAP (healthcare-associated pneumonia) -IV antibiotics as above, other supportive treatment PRN  *  Essential hypertension -home dose antihypertensives  *  Coronary artery disease of native artery of native heart with stable angina pectoris ([place]) -continue home meds  *  Chronic atrial fibrillation -home dose rate controlling medications and anticoagulation  *  Chronic diastolic CHF (congestive heart failure) ([place]) -patient potentially has some mild edema on chest x-ray,  though his BNP tonight is  at a level consistent with prior values in her system.  He potentially has some mild exacerbation of his heart failure, which would likely be related to his sepsis.    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  *  Diabetes type 2, controlled ([place]) -sliding scale insulin  *  Chronic obstructive pulmonary disease (HCC) -continue home inhalers  *  Hyperlipidemia -home dose antilipid  *  BPH (benign prostatic hyperplasia) -continue home meds  *  Anxiety -home dose anxiolytic  *  Acquired hypothyroidism -home dose thyroid replacement      All the records are reviewed and case discussed with Care Management/Social Workerr. Management plans discussed with the patient, family and they are in agreement.  CODE STATUS: full.  TOTAL TIME TAKING CARE OF THIS PATIENT: 35 minutes.   POSSIBLE D/C IN 1-2 DAYS, DEPENDING ON CLINICAL CONDITION.   Vaughan Basta M.D on 08/25/2018   Between 7am to 6pm - Pager - [phone]  After 6pm go to [email or web address] - password [email or web address] [facility]  Office  [phone]  CC: Primary care physician; Jearld Fenton, NP  Note: This dictation was prepared with Dragon dictation along with smaller phrase technology. Any transcriptional errors that result from this process are unintentional.

## 2018-08-25 NOTE — Consult Note (Signed)
[facility] Nurse wound consult note Patient receiving care in [facility] [address] Reason for Consult: Right arm wounds and left foot amputation site Wound type: Right arm wound is from large skin tears that occurred approximately 3 weeks ago, according to the patient, when he fell and hit his arm on the nightstand at home.  Partial skin flaps are in place.  There are two distinct areas, one just above the wrist and the largest, deepest one just below the [facility] fossa that extends to the back of the forearm.  This largest one is approximately 50% yellow.  The smaller one at the wrist area is 100% pink.  Both are covered in non-adherent gauze, ABD pads and kerlex.  All areas have serous drainage on the existing dressing.  None have an odor or overt signs of infection.  HH RN has been dressing these when they also dress the left surgical wound.  The left great toe amputation site has some type of alginate or hydrofiber packing in the wound secured with kerlex.  I removed the packing.  The wound measures 3.2 cm x 1.8 cm x 2 cm.  95% of the cavity of the wound is yellow.  There is no odor, no surrounding erythema, no excessive drainage on the packing or overlying kerlex.  I understand Dr. Cleda Mccreedy, DPM, performed the surgery in March of this year.  Should he consult on the patient, and place different wound care instructions, discontinue my orders and follow his. Dressing procedure/placement/frequency:  Cleanse the wounds on the right arm with no rinse cleanser spray (from the clean utility). Cover all areas with Mepitel Kellie Simmering [phone], size 3 x 4--which I have requested from Material Management 6 of these.  Use as many as necessary to the all open areas).  Cover these with ABD pads and secure with Kerlex.  Change daily.  Cleanse the left foot amputation site with saline.  Place a piece of AquaCel Ag+ into the wound bed to fill the wound. Cover with gauze. Secure with kerlex.  Change daily.  Monitor the wound area(s) for worsening of  condition such as: Signs/symptoms of infection,  Increase in size,  Development of or worsening of odor, Development of pain, or increased pain at the affected locations.  Notify the medical team if any of these develop.  Thank you for the consult.  Discussed plan of care with the patient and bedside nurse.  [place] nurse will not follow at this time.  Please re-consult the [facility] team if needed.  Val Riles, RN, MSN, CWOCN, CNS-BC, pager [phone]

## 2018-08-25 NOTE — ED Notes (Signed)
ED TO INPATIENT HANDOFF REPORT  ED Nurse Name and Phone #:  Malaki Koury [phone]  S Name/Age/Gender [name]. 85 y.o. male Room/Bed: ED11A/ED11A  Code Status   Code Status: Prior  Home/SNF/Other Home Patient oriented to: self, place, time and situation Is this baseline? Yes   Triage Complete: Triage complete  Chief Complaint SOB  Triage Note Pt presents on advice from nurse line that daughter called. Pt has no new complaints and states he feels "good" today. Pt in no acute distress. 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 Pt is A&Ox4, presently in a-fib w/ rate under 120. Pt has pronounced ascites. PMH of Stage 4 lung CA but is unable to tell where metastisis occurs.   Allergies Allergies  Allergen Reactions  . Contrast Media [Iodinated Diagnostic Agents] Shortness Of Breath  . Morphine And Related Other (See Comments)    Hallucinations   . Niacin And Related Dermatitis  . Other     Other reaction(s): SHORTNESS OF BREATH  . Amlodipine Rash    Level of Care/Admitting Diagnosis ED Disposition    ED Disposition Condition [facility]: [place] [[id number]]  Level of Care: Med-Surg [16]  Covid Evaluation: N/A  Diagnosis: Sepsis [facility]) [[id number]]  Admitting Physician: Lance Coon [[id number]]  Attending Physician: Lance Coon [phone]  Estimated length of stay: past midnight tomorrow  Certification:: I certify this patient will need inpatient services for at least 2 midnights  PT Class (Do Not Modify): Inpatient [101]  PT Acc Code (Do Not Modify): Private [1]       B Medical/Surgery History Past Medical History:  Diagnosis Date  . Arthritis   . CAD    a. MI 01/29/1996 tx'd w/ TPA @ [place] [facility]; b. Myoview 06/2005: EF 50%, scar @ apex, mild peri-infarct ischemia  . Cancer ([place])    skin  . Chronic atrial  fibrillation    a. since 2006; b. on warfarin  . Chronic diastolic CHF (congestive heart failure) ([place])    a. echo 04/2006: EF lower limits of nl, mod LVH, mild aortic root dilatation, & mild MR, biatrial enlargement; b. echo 04/2013: EF 60%, mod dilated LA, mild MR & TR, mod pulm HTN w/ RV systolic pressure 53, c. echo 04/21/14: EF 55-60%, unable to exclude WMA, severely dilated LA 6.6 cm, nl RVSP, mildly dilated aortic root  . COPD (chronic obstructive pulmonary disease) (H[facility])    oxygen prn at home  . CVA [id number]   x2  . DM   . Falls   . GERD (gastroesophageal reflux disease)   . History of kidney stones   . HYPERLIPIDEMIA   . HYPERTENSION   . Kidney stone    a. s/p left ureteral stenting 04/24/14  . Left arm weakness    limited movement. S/P fall injury  . Neuropathy of both feet   . Poor balance   . Wears dentures    full upper and lower   Past Surgical History:  Procedure Laterality Date  . AMPUTATION TOE Left 06/13/2018   Procedure: 1st Ray Resection Left;  Surgeon: Sharlotte Alamo, DPM;  Location: [facility];  Service: Podiatry;  Laterality: Left;  . BLADDER SURGERY     stent placement   . CARDIAC CATHETERIZATION  1997   [facility]  . CAROTID STENT INSERTION  1997  . CATARACT EXTRACTION W/PHACO Left 10/11/2017  Procedure: CATARACT EXTRACTION PHACO AND INTRAOCULAR LENS PLACEMENT ([place]) COMPLICATED LEFT DIABETIC;  Surgeon: Leandrew Koyanagi, MD;  Location: [facility] [place];  Service: Ophthalmology;  Laterality: Left;  MALYUGIN Diabetic - insulin  . CATARACT EXTRACTION W/PHACO Right 11/30/2017   Procedure: CATARACT EXTRACTION PHACO AND INTRAOCULAR LENS PLACEMENT ([place]) COMPLICATED  RIGHT DIABETIC;  Surgeon: Leandrew Koyanagi, MD;  Location: [place];  Service: Ophthalmology;  Laterality: Right;  Diabetic - insulin  . CIRCUMCISION  2016  . CORONARY ANGIOPLASTY  1997   s/p stent placement x 2   . CYSTOSCOPY W/ URETERAL STENT REMOVAL Left 10/09/2014   Procedure: CYSTOSCOPY  WITH STENT REMOVAL;  Surgeon: Hollice Espy, MD;  Location: [facility];  Service: Urology;  Laterality: Left;  . CYSTOSCOPY WITH STENT PLACEMENT Left 10/09/2014   Procedure: CYSTOSCOPY WITH STENT PLACEMENT;  Surgeon: Hollice Espy, MD;  Location: [facility];  Service: Urology;  Laterality: Left;  . KIDNEY SURGERY  05/2013   s/p stent placement   . LOWER EXTREMITY ANGIOGRAPHY Left 06/12/2018   Procedure: Lower Extremity Angiography;  Surgeon: Algernon Huxley, MD;  Location: [place] [facility];  Service: Cardiovascular;  Laterality: Left;  . stents ureters Bilateral   . TONSILLECTOMY AND ADENOIDECTOMY  1959  . URETEROSCOPY WITH HOLMIUM LASER LITHOTRIPSY Left 10/09/2014   Procedure: URETEROSCOPY WITH HOLMIUM LASER LITHOTRIPSY;  Surgeon: Hollice Espy, MD;  Location: [facility];  Service: Urology;  Laterality: Left;     A IV Location/Drains/Wounds Patient Lines/Drains/Airways Status   Active Line/Drains/Airways    Name:   Placement date:   Placement time:   Site:   Days:   Peripheral IV 08/24/18 Left Forearm   08/24/18    1657    Forearm   1   Peripheral IV 08/24/18 Left Forearm   08/24/18    1900    Forearm   1   Incision (Closed) 06/13/18 Foot Left   06/13/18    1510     73   Wound / Incision (Open or Dehisced) 04/17/18 Venous stasis ulcer Leg Left;Lower weeping wound, described by patient as "venous ulcer with 3 open spots"   04/17/18    1800    Leg   130          Intake/Output Last 24 hours  Intake/Output Summary (Last 24 hours) at 08/25/2018 0216 Last data filed at 08/24/2018 2312 Gross per 24 hour  Intake -  Output 325 ml  Net -325 ml    Labs/Imaging Results for orders placed or performed during the hospital encounter of 08/24/18 (from the past 48 hour(s))  CBC with Differential/Platelet     Status: Abnormal   Collection Time: 08/24/18  5:49 PM  Result Value Ref Range   WBC 17.0 (H) 4.0 - 10.5 K/uL   RBC 4.82 4.22 - 5.81 MIL/uL   Hemoglobin 13.8 13.0 - 17.0 g/dL   HCT 44.3 39.0 -  52.0 %   MCV 91.9 80.0 - 100.0 fL   MCH 28.6 26.0 - 34.0 pg   MCHC 31.2 30.0 - 36.0 g/dL   RDW 16.8 (H) 11.5 - 15.5 %   Platelets 218 150 - 400 K/uL   nRBC 0.0 0.0 - 0.2 %   Neutrophils Relative % 89 %   Neutro Abs 15.0 (H) 1.7 - 7.7 K/uL   Lymphocytes Relative 4 %   Lymphs Abs 0.8 0.7 - 4.0 K/uL   Monocytes Relative 6 %   Monocytes Absolute 1.1 (H) 0.1 - 1.0 K/uL   Eosinophils Relative 0 %  Eosinophils Absolute 0.0 0.0 - 0.5 K/uL   Basophils Relative 0 %   Basophils Absolute 0.0 0.0 - 0.1 K/uL   Immature Granulocytes 1 %   Abs Immature Granulocytes 0.17 (H) 0.00 - 0.07 K/uL    Comment: Performed at [facility], [address] [place], [place] [address]  Comprehensive metabolic panel     Status: Abnormal   Collection Time: 08/24/18  5:49 PM  Result Value Ref Range   Sodium 138 135 - 145 mmol/L   Potassium 4.8 3.5 - 5.1 mmol/L   Chloride 102 98 - 111 mmol/L   CO2 26 22 - 32 mmol/L   Glucose, Bld 224 (H) 70 - 99 mg/dL   BUN 36 (H) 8 - 23 mg/dL   Creatinine, Ser 1.48 (H) 0.61 - 1.24 mg/dL   Calcium 8.7 (L) 8.9 - 10.3 mg/dL   Total Protein 6.7 6.5 - 8.1 g/dL   Albumin 3.0 (L) 3.5 - 5.0 g/dL   AST 20 15 - 41 U/L   ALT 22 0 - 44 U/L   Alkaline Phosphatase 72 38 - 126 U/L   Total Bilirubin 0.6 0.3 - 1.2 mg/dL   GFR calc non Af Amer 43 (L) >60 mL/min   GFR calc Af Amer 50 (L) >60 mL/min   Anion gap 10 5 - 15    Comment: Performed at [facility], [address] [place], [place] [address]  Troponin I - ONCE - STAT     Status: None   Collection Time: 08/24/18  5:49 PM  Result Value Ref Range   Troponin I <0.03 <0.03 ng/mL    Comment: Performed at [facility], [address] [place], [place] [address]  Lactic acid, plasma     Status: Abnormal   Collection Time: 08/24/18  5:49 PM  Result Value Ref Range   Lactic Acid, Venous 2.3 (HH) 0.5 - 1.9 mmol/L    Comment: CRITICAL RESULT CALLED TO, READ BACK BY AND VERIFIED WITH RACHAEL HAIDEN 08/24/18  @ 1846  MLK Performed at [facility], [address] [place], [place] [address]   Brain natriuretic peptide     Status: Abnormal   Collection Time: 08/24/18  5:49 PM  Result Value Ref Range   B Natriuretic Peptide 317.0 (H) 0.0 - 100.0 pg/mL    Comment: Performed at [facility], [address] [place], [place] [address]  Protime-INR     Status: Abnormal   Collection Time: 08/24/18  5:50 PM  Result Value Ref Range   Prothrombin Time 16.6 (H) 11.4 - 15.2 seconds   INR 1.4 (H) 0.8 - 1.2    Comment: (NOTE) INR goal varies based on device and disease states. Performed at [facility], [address] [place], [place] [address]   Urinalysis, Complete w Microscopic     Status: Abnormal   Collection Time: 08/24/18  5:50 PM  Result Value Ref Range   Color, Urine STRAW (A) YELLOW   APPearance CLEAR (A) CLEAR   Specific Gravity, Urine 1.009 1.005 - 1.030   pH 5.0 5.0 - 8.0   Glucose, UA 50 (A) NEGATIVE mg/dL   Hgb urine dipstick SMALL (A) NEGATIVE   Bilirubin Urine NEGATIVE NEGATIVE   Ketones, ur NEGATIVE NEGATIVE mg/dL   Protein, ur NEGATIVE NEGATIVE mg/dL   Nitrite NEGATIVE NEGATIVE   Leukocytes,Ua TRACE (A) NEGATIVE   RBC / HPF 0-5 0 - 5 RBC/hpf   WBC, UA 6-10 0 - 5 WBC/hpf  Bacteria, UA NONE SEEN NONE SEEN   Squamous Epithelial / LPF NONE SEEN 0 - 5   Mucus PRESENT    Hyaline Casts, UA PRESENT     Comment: Performed at [facility], [address], [place], [place] [address]  SARS Coronavirus 2 (CEPHEID - Performed in [place] hospital lab), Hosp Order     Status: None   Collection Time: 08/24/18  6:39 PM  Result Value Ref Range   SARS Coronavirus 2 NEGATIVE NEGATIVE    Comment: (NOTE) If result is NEGATIVE SARS-CoV-2 target nucleic acids are NOT DETECTED. The SARS-CoV-2 RNA is generally detectable in upper and lower  respiratory specimens during the acute phase of infection. The lowest  concentration of SARS-CoV-2 viral copies  this assay can detect is 250  copies / mL. A negative result does not preclude SARS-CoV-2 infection  and should not be used as the sole basis for treatment or other  patient management decisions.  A negative result may occur with  improper specimen collection / handling, submission of specimen other  than nasopharyngeal swab, presence of viral mutation(s) within the  areas targeted by this assay, and inadequate number of viral copies  (<250 copies / mL). A negative result must be combined with clinical  observations, patient history, and epidemiological information. If result is POSITIVE SARS-CoV-2 target nucleic acids are DETECTED. The SARS-CoV-2 RNA is generally detectable in upper and lower  respiratory specimens dur ing the acute phase of infection.  Positive  results are indicative of active infection with SARS-CoV-2.  Clinical  correlation with patient history and other diagnostic information is  necessary to determine patient infection status.  Positive results do  not rule out bacterial infection or co-infection with other viruses. If result is PRESUMPTIVE POSTIVE SARS-CoV-2 nucleic acids MAY BE PRESENT.   A presumptive positive result was obtained on the submitted specimen  and confirmed on repeat testing.  While 2019 novel coronavirus  (SARS-CoV-2) nucleic acids may be present in the submitted sample  additional confirmatory testing may be necessary for epidemiological  and / or clinical management purposes  to differentiate between  SARS-CoV-2 and other Sarbecovirus currently known to infect humans.  If clinically indicated additional testing with an alternate test  methodology [phone]) is advised. The SARS-CoV-2 RNA is generally  detectable in upper and lower respiratory sp ecimens during the acute  phase of infection. The expected result is Negative. Fact Sheet for Patients:  StrictlyIdeas.no Fact Sheet for Healthcare  Providers: BankingDealers.co.za This test is not yet approved or cleared by the [place] FDA and has been authorized for detection and/or diagnosis of SARS-CoV-2 by FDA under an Emergency Use Authorization (EUA).  This EUA will remain in effect (meaning this test can be used) for the duration of the COVID-19 declaration under Section 564(b)(1) of the Act, 21 U.S.C. section 360bbb-3(b)(1), unless the authorization is terminated or revoked sooner. Performed at [facility], [address] [place], [place] [address]   Lactic acid, plasma     Status: Abnormal   Collection Time: 08/24/18  8:35 PM  Result Value Ref Range   Lactic Acid, Venous 2.1 (HH) 0.5 - 1.9 mmol/L    Comment: CRITICAL RESULT CALLED TO, READ BACK BY AND VERIFIED WITH BRETT KENNEDY @2059  08/24/18 MJU Performed at [facility], [address] [place], [place] [address]   Lactic acid, plasma     Status: None   Collection Time: 08/25/18 12:12 AM  Result Value Ref Range  Lactic Acid, Venous 1.9 0.5 - 1.9 mmol/L    Comment: Performed at [facility], [address] [place], [place] [address]   US Venous Img Lower Unilateral Left  Result Date: 08/24/2018 CLINICAL DATA:  Left lower extremity swelling. EXAM: Left LOWER EXTREMITY VENOUS DOPPLER ULTRASOUND TECHNIQUE: Gray-scale sonography with graded compression, as well as color Doppler and duplex ultrasound were performed to evaluate the lower extremity deep venous systems from the level of the common femoral vein and including the common femoral, femoral, profunda femoral, popliteal and calf veins including the posterior tibial, peroneal and gastrocnemius veins when visible. The superficial great saphenous vein was also interrogated. Spectral Doppler was utilized to evaluate flow at rest and with distal augmentation maneuvers in the common femoral, femoral and popliteal veins. COMPARISON:  None. FINDINGS: Contralateral  Common Femoral Vein: Respiratory phasicity is normal and symmetric with the symptomatic side. No evidence of thrombus. Normal compressibility. Common Femoral Vein: No evidence of thrombus. Normal compressibility, respiratory phasicity and response to augmentation. Saphenofemoral Junction: No evidence of thrombus. Normal compressibility and flow on color Doppler imaging. Profunda Femoral Vein: No evidence of thrombus. Normal compressibility and flow on color Doppler imaging. Femoral Vein: No evidence of thrombus. Normal compressibility, respiratory phasicity and response to augmentation. Popliteal Vein: No evidence of thrombus. Normal compressibility, respiratory phasicity and response to augmentation. Calf Veins: No evidence of thrombus. Normal compressibility and flow on color Doppler imaging. Venous Reflux:  None. Other Findings:  Edema is noted in the subcutaneous tissues. IMPRESSION: No definite evidence of deep venous thrombosis seen in the left lower extremity. Electronically Signed   By: Marijo Conception M.D.   On: 08/24/2018 19:51   Dg Chest Portable 1 View  Result Date: 08/24/2018 CLINICAL DATA:  Shortness of breath. EXAM: PORTABLE CHEST 1 VIEW COMPARISON:  Radiograph of Aug 11, 2018. FINDINGS: Stable cardiomegaly. Atherosclerosis of thoracic aorta is noted. Increased reticular densities are noted throughout both lungs concerning for worsening edema or possibly atypical inflammation superimposed upon chronic interstitial disease. No pneumothorax or pleural effusion is noted. Bony thorax is unremarkable. IMPRESSION: Stable cardiomegaly. Increased reticular densities are noted throughout both lungs concerning for superimposed edema or atypical inflammation. Aortic Atherosclerosis (ICD10-I70.0). Electronically Signed   By: Marijo Conception M.D.   On: 08/24/2018 17:44   Dg Foot 2 Views Left  Result Date: 08/24/2018 CLINICAL DATA:  Infection. EXAM: LEFT FOOT - 2 VIEW COMPARISON:  MRI and radiographs of  June 09, 2018. 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 Soft tissue gas is noted in this area which may represent postsurgical changes or possibly infection. No definite lytic destruction is seen to suggest osteomyelitis currently. Mild posterior calcaneal spurring is noted. 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 Soft tissue gas is noted in this area which may represent postsurgical changes or possibly infection. No definite evidence of osteomyelitis. Electronically Signed   By: Marijo Conception M.D.   On: 08/24/2018 18:20    Pending Labs Unresulted Labs (From admission, onward)    Start     Ordered   08/24/18 1851  Urinalysis, Routine w reflex microscopic  ONCE - STAT,   STAT     08/24/18 1851   08/24/18 1722  Culture, blood (Routine x 2)  BLOOD CULTURE X 2,   STAT     08/24/18 1722   Signed and Held  Basic metabolic panel  Tomorrow morning,   R  Signed and Held   Signed and Held  CBC  Tomorrow morning,   R     Signed and Held          Vitals/Pain Today's Vitals   08/25/18 0115 08/25/18 0130 08/25/18 0145 08/25/18 0200  BP: 119/73 (!) 116/56 119/73 120/68  Pulse: 84 77 84 84  Resp: (!) 23 (!) 24 (!) 27 (!) 27  Temp:      TempSrc:      SpO2: 100% 100% 100% 99%  Weight:      Height:      PainSc:        Isolation Precautions No active isolations  Medications Medications  vancomycin (VANCOCIN) IVPB 750 mg/150 ml premix (has no administration in time range)  ceFEPIme (MAXIPIME) 2 g in sodium chloride 0.9 % 100 mL IVPB (has no administration in time range)  sodium chloride 0.9 % bolus 500 mL (0 mLs Intravenous Stopped 08/24/18 2034)  metoprolol succinate (TOPROL-XL) 24 hr tablet 50 mg (50 mg Oral Given 08/24/18 1937)  cefTRIAXone (ROCEPHIN) 1 g in sodium chloride 0.9 % 100 mL IVPB (0 g Intravenous Stopped 08/24/18 2030)  azithromycin (ZITHROMAX) 500 mg in sodium  chloride 0.9 % 250 mL IVPB (0 mg Intravenous Stopped 08/24/18 2150)  sodium chloride 0.9 % bolus 500 mL (0 mLs Intravenous Stopped 08/25/18 0009)  vancomycin (VANCOCIN) 2,000 mg in sodium chloride 0.9 % 500 mL IVPB (0 mg Intravenous Stopped 08/25/18 0146)    Mobility walks with device High fall risk   Focused Assessments n/a   R Recommendations: See Admitting Provider Note  Report given to:   Additional Notes: n/a

## 2018-08-25 NOTE — Consult Note (Signed)
ORTHOPAEDIC CONSULTATION  REQUESTING PHYSICIAN: Vaughan Basta, *  Chief Complaint: Left foot ulcer  HPI: [name]. is a 85 y.o. male who complains of ulceration to his left foot.  Admitted with shortness of breath.  Found to have pneumonia is currently on antibiotics.  Elevated white blood cell count was noted.  X-rays of the left foot were concerning for possible gas in the soft tissue.  Patient is status post amputation of his left great toe in March.  Has been at a rehab facility.  He follows Dr. Thresa Ross in the outpatient for this partial first ray resection.  Past Medical History:  Diagnosis Date  . Arthritis   . CAD    a. MI 01/29/1996 tx'd w/ TPA @ [facility]; b. Myoview 06/2005: EF 50%, scar @ apex, mild peri-infarct ischemia  . Cancer ([place])    skin  . Chronic atrial fibrillation    a. since 2006; b. on warfarin  . Chronic diastolic CHF (congestive heart failure) ([place])    a. echo 04/2006: EF lower limits of nl, mod LVH, mild aortic root dilatation, & mild MR, biatrial enlargement; b. echo 04/2013: EF 60%, mod dilated LA, mild MR & TR, mod pulm HTN w/ RV systolic pressure 53, c. echo 04/21/14: EF 55-60%, unable to exclude WMA, severely dilated LA 6.6 cm, nl RVSP, mildly dilated aortic root  . COPD (chronic obstructive pulmonary disease) (H[facility])    oxygen prn at home  . CVA [id number]   x2  . DM   . Falls   . GERD (gastroesophageal reflux disease)   . History of kidney stones   . HYPERLIPIDEMIA   . HYPERTENSION   . Kidney stone    a. s/p left ureteral stenting 04/24/14  . Left arm weakness    limited movement. S/P fall injury  . Neuropathy of both feet   . Poor balance   . Wears dentures    full upper and lower   Past Surgical History:  Procedure Laterality Date  . AMPUTATION TOE Left 06/13/2018   Procedure: 1st Ray Resection Left;  Surgeon: Sharlotte Alamo, DPM;  Location: [facility];  Service: Podiatry;  Laterality: Left;  . BLADDER SURGERY     stent placement    . CARDIAC CATHETERIZATION  1997   [facility]  . CAROTID STENT INSERTION  1997  . CATARACT EXTRACTION W/PHACO Left 10/11/2017   Procedure: CATARACT EXTRACTION PHACO AND INTRAOCULAR LENS PLACEMENT ([place]) COMPLICATED LEFT DIABETIC;  Surgeon: Leandrew Koyanagi, MD;  Location: [facility];  Service: Ophthalmology;  Laterality: Left;  MALYUGIN Diabetic - insulin  . CATARACT EXTRACTION W/PHACO Right 11/30/2017   Procedure: CATARACT EXTRACTION PHACO AND INTRAOCULAR LENS PLACEMENT ([place]) COMPLICATED  RIGHT DIABETIC;  Surgeon: Leandrew Koyanagi, MD;  Location: [facility];  Service: Ophthalmology;  Laterality: Right;  Diabetic - insulin  . CIRCUMCISION  2016  . CORONARY ANGIOPLASTY  1997   s/p stent placement x 2   . CYSTOSCOPY W/ URETERAL STENT REMOVAL Left 10/09/2014   Procedure: CYSTOSCOPY WITH STENT REMOVAL;  Surgeon: Hollice Espy, MD;  Location: [facility];  Service: Urology;  Laterality: Left;  . CYSTOSCOPY WITH STENT PLACEMENT Left 10/09/2014   Procedure: CYSTOSCOPY WITH STENT PLACEMENT;  Surgeon: Hollice Espy, MD;  Location: [facility];  Service: Urology;  Laterality: Left;  . KIDNEY SURGERY  05/2013   s/p stent placement   . LOWER EXTREMITY ANGIOGRAPHY Left 06/12/2018   Procedure: Lower Extremity Angiography;  Surgeon: Algernon Huxley, MD;  Location: [facility];  Service: Cardiovascular;  Laterality: Left;  . stents ureters Bilateral   . TONSILLECTOMY AND ADENOIDECTOMY  1959  . URETEROSCOPY WITH HOLMIUM LASER LITHOTRIPSY Left 10/09/2014   Procedure: URETEROSCOPY WITH HOLMIUM LASER LITHOTRIPSY;  Surgeon: Hollice Espy, MD;  Location: [facility];  Service: Urology;  Laterality: Left;   Social History   Socioeconomic History  . Marital status: Married    Spouse name: Not on file  . Number of children: Not on file  . Years of education: Not on file  . Highest education level: Not on file  Occupational History  . Not on file  Social Needs  . Financial resource strain: Not  very hard  . Food insecurity:    Worry: Never true    Inability: Never true  . Transportation needs:    Medical: No    Non-medical: No  Tobacco Use  . Smoking status: Former Smoker    Packs/day: 2.00    Years: 40.00    Pack years: 80.00    Types: Cigarettes    Last attempt to quit: 05/24/1990    Years since quitting: 28.2  . Smokeless tobacco: Former Systems developer    Quit date: 05/24/1990  Substance and Sexual Activity  . Alcohol use: No  . Drug use: No  . Sexual activity: Not Currently  Lifestyle  . Physical activity:    Days per week: Patient refused    Minutes per session: Patient refused  . Stress: Not at all  Relationships  . Social connections:    Talks on phone: Patient refused    Gets together: Patient refused    Attends religious service: Patient refused    Active member of club or organization: Patient refused    Attends meetings of clubs or organizations: Patient refused    Relationship status: Patient refused  Other Topics Concern  . Not on file  Social History Narrative  . Not on file   Family History  Problem Relation Age of Onset  . Heart disease Mother   . Heart disease Maternal Grandmother   . Diabetes Maternal Grandmother   . Cancer Neg Hx   . Stroke Neg Hx    Allergies  Allergen Reactions  . Contrast Media [Iodinated Diagnostic Agents] Shortness Of Breath  . Morphine And Related Other (See Comments)    Hallucinations   . Niacin And Related Dermatitis  . Other     Other reaction(s): SHORTNESS OF BREATH  . Amlodipine Rash   Prior to Admission medications   Medication Sig Start Date End Date Taking? Authorizing Provider  acetaminophen (TYLENOL) 650 MG CR tablet Take 650 mg by mouth every 8 (eight) hours as needed for pain.    Yes [provider]  albuterol (PROAIR HFA) 108 (90 Base) MCG/ACT inhaler USE 1 TO 2 INHALATIONS EVERY 4 HOURS AS NEEDED Patient taking differently: Inhale 1-2 puffs into the lungs every 4 (four) hours as needed for  wheezing or shortness of breath.  11/09/17  Yes Wilhelmina Mcardle, MD  ALPRAZolam Duanne Moron) 0.25 MG tablet Take 1 tablet (0.25 mg total) by mouth at bedtime as needed for anxiety. 07/19/18  Yes Baity, Coralie Keens, NP  apixaban (ELIQUIS) 2.5 MG TABS tablet Take 1 tablet (2.5 mg total) by mouth 2 (two) times daily. 03/27/18  Yes Minna Merritts, MD  atorvastatin (LIPITOR) 40 MG tablet TAKE 1 TABLET DAILY Patient taking differently: Take 40 mg by mouth daily at 6 PM.  07/17/18  Yes Baity, Coralie Keens, NP  cephALEXin (  KEFLEX) 500 MG capsule Take 500 mg by mouth 3 (three) times daily.  08/08/18 08/28/18 Yes [provider]  ciprofloxacin (CIPRO) 250 MG tablet Take 1 tablet (250 mg total) by mouth 2 (two) times daily. 08/22/18  Yes Jearld Fenton, NP  citalopram (CELEXA) 10 MG tablet TAKE 1 TABLET DAILY 07/18/18  Yes Baity, Coralie Keens, NP  COMBIGAN 0.2-0.5 % ophthalmic solution Place 1 drop into both eyes daily. 06/26/18  Yes [provider]  dorzolamide (TRUSOPT) 2 % ophthalmic solution Place 1 drop into both eyes 3 (three) times daily.  03/31/16  Yes [provider]  ezetimibe (ZETIA) 10 MG tablet TAKE 1 TABLET DAILY 07/17/18  Yes Baity, Coralie Keens, NP  finasteride (PROSCAR) 5 MG tablet TAKE 1 TABLET DAILY Patient taking differently: Take 5 mg by mouth daily.  06/29/17  Yes Baity, Coralie Keens, NP  gabapentin (NEURONTIN) 100 MG capsule TAKE 1 CAPSULE THREE TIMES A DAY Patient taking differently: Take 100 mg by mouth at bedtime.  06/09/18  Yes Baity, Coralie Keens, NP  insulin detemir (LEVEMIR) 100 UNIT/ML injection Inject 0.28 mLs (28 Units total) into the skin daily at 10 pm. Patient taking differently: Inject 42 Units into the skin daily at 10 pm.  06/15/18  Yes Fritzi Mandes, MD  insulin lispro (HUMALOG) 100 UNIT/ML injection Inject 0.04 mLs (4 Units total) into the skin 3 (three) times daily with meals. Patient taking differently: Inject 10-18 Units into the skin 3 (three) times daily with meals. 18 am, 3  lunch, 10 supper 06/15/18  Yes Fritzi Mandes, MD  latanoprost (XALATAN) 0.005 % ophthalmic solution Place 1 drop into both eyes at bedtime.  04/18/16  Yes [provider]  levothyroxine (SYNTHROID, LEVOTHROID) 50 MCG tablet TAKE 1 TABLET DAILY BEFORE BREAKFAST 06/27/18  Yes Baity, Coralie Keens, NP  metoprolol succinate (TOPROL-XL) 50 MG 24 hr tablet TAKE 1 TABLET TWICE A DAY 06/09/18  Yes Baity, Coralie Keens, NP  potassium chloride SA (K-DUR,KLOR-CON) 20 MEQ tablet Take 4 tablets (80 meq) by mouth twice daily, take an extra 1 tablet (20 meq) on the days you take metolazone Patient taking differently: Take 80 mEq by mouth 2 (two) times daily. Take 4 tablets (80 meq) by mouth twice daily at breakfast and at lunch, take an extra 1 tablet (20 meq) on the days you take metolazone put tablet in applesauce to aid swallowing 02/10/18  Yes Gollan, Kathlene November, MD  predniSONE (DELTASONE) 10 MG tablet Take 1 tablet (10 mg total) by mouth daily with breakfast. 05/05/18  Yes Baity, Coralie Keens, NP  SYMBICORT 160-4.5 MCG/ACT inhaler USE 2 INHALATIONS TWICE A DAY Patient taking differently: Inhale 1 puff into the lungs 2 (two) times daily.  08/31/17  Yes Wilhelmina Mcardle, MD  tamsulosin (FLOMAX) 0.4 MG CAPS capsule TAKE 1 CAPSULE DAILY 07/18/18  Yes Jearld Fenton, NP  torsemide (DEMADEX) 20 MG tablet Take 40 mg by mouth 2 (two) times daily.   Yes [provider]  traZODone (DESYREL) 50 MG tablet Take 2 tablets (100 mg total) by mouth at bedtime as needed for sleep. 02/16/18  Yes Baity, Coralie Keens, NP  umeclidinium bromide (INCRUSE ELLIPTA) 62.5 MCG/INH AEPB Inhale 1 puff into the lungs daily.   Yes [provider]  clotrimazole (LOTRIMIN) 1 % cream Apply 1 application topically 2 (two) times daily. 11/18/17   Bedsole, Amy E, MD  COLCRYS 0.6 MG tablet TAKE 1 TABLET TWICE A DAY AS NEEDED (ACUTE GOUT FLARE) USE AS DIRECTED  07/06/18   Jearld Fenton, NP  diclofenac sodium (VOLTAREN) 1 % GEL Apply 4 g topically 4 (four)  times daily. 01/05/18   Copland, Frederico Hamman, MD  glipiZIDE (GLUCOTROL) 5 MG tablet TAKE 1 TABLET TWICE A DAY BEFORE MEALS Patient not taking: Reported on 08/24/2018 07/31/18   Jearld Fenton, NP  Insulin Pen Needle (BD PEN NEEDLE NANO U/F) 32G X 4 MM MISC USE THREE TIMES A DAY FOR INSULIN ADMINISTRATION 02/15/18   Jearld Fenton, NP  Insulin Syringe-Needle U-100 30G X 1/2" 1 ML MISC 1 each by Does not apply route 3 (three) times daily. 07/19/14   Jearld Fenton, NP  metolazone (ZAROXOLYN) 5 MG tablet Take 1 tablet (5 mg total) by mouth daily as needed (swelling). 03/13/18   Minna Merritts, MD  montelukast (SINGULAIR) 10 MG tablet TAKE 1 TABLET AT BEDTIME Patient taking differently: Take 10 mg by mouth at bedtime.  02/24/18   Jearld Fenton, NP  nitroGLYCERIN (NITROSTAT) 0.4 MG SL tablet Place 0.4 mg under the tongue every 5 (five) minutes as needed.      [provider]  nystatin-triamcinolone (MYCOLOG II) cream Apply 1 application topically 2 (two) times daily.  03/12/14   [provider]  traMADol (ULTRAM) 50 MG tablet Take 1 tablet (50 mg total) by mouth 2 (two) times daily. 06/29/18   Jearld Fenton, NP   US Venous Img Lower Unilateral Left  Result Date: 08/24/2018 CLINICAL DATA:  Left lower extremity swelling. EXAM: Left LOWER EXTREMITY VENOUS DOPPLER ULTRASOUND TECHNIQUE: Gray-scale sonography with graded compression, as well as color Doppler and duplex ultrasound were performed to evaluate the lower extremity deep venous systems from the level of the common femoral vein and including the common femoral, femoral, profunda femoral, popliteal and calf veins including the posterior tibial, peroneal and gastrocnemius veins when visible. The superficial great saphenous vein was also interrogated. Spectral Doppler was utilized to evaluate flow at rest and with distal augmentation maneuvers in the common femoral, femoral and popliteal veins. COMPARISON:  None. FINDINGS: Contralateral  Common Femoral Vein: Respiratory phasicity is normal and symmetric with the symptomatic side. No evidence of thrombus. Normal compressibility. Common Femoral Vein: No evidence of thrombus. Normal compressibility, respiratory phasicity and response to augmentation. Saphenofemoral Junction: No evidence of thrombus. Normal compressibility and flow on color Doppler imaging. Profunda Femoral Vein: No evidence of thrombus. Normal compressibility and flow on color Doppler imaging. Femoral Vein: No evidence of thrombus. Normal compressibility, respiratory phasicity and response to augmentation. Popliteal Vein: No evidence of thrombus. Normal compressibility, respiratory phasicity and response to augmentation. Calf Veins: No evidence of thrombus. Normal compressibility and flow on color Doppler imaging. Venous Reflux:  None. Other Findings:  Edema is noted in the subcutaneous tissues. IMPRESSION: No definite evidence of deep venous thrombosis seen in the left lower extremity. Electronically Signed   By: Marijo Conception M.D.   On: 08/24/2018 19:51   Dg Chest Portable 1 View  Result Date: 08/24/2018 CLINICAL DATA:  Shortness of breath. EXAM: PORTABLE CHEST 1 VIEW COMPARISON:  Radiograph of Aug 11, 2018. FINDINGS: Stable cardiomegaly. Atherosclerosis of thoracic aorta is noted. Increased reticular densities are noted throughout both lungs concerning for worsening edema or possibly atypical inflammation superimposed upon chronic interstitial disease. No pneumothorax or pleural effusion is noted. Bony thorax is unremarkable. IMPRESSION: Stable cardiomegaly. Increased reticular densities are noted throughout both lungs concerning for superimposed edema or atypical inflammation. Aortic Atherosclerosis (ICD10-I70.0). Electronically Signed   By: Jeneen Rinks  Murlean Caller M.D.   On: 08/24/2018 17:44   Dg Foot 2 Views Left  Result Date: 08/24/2018 CLINICAL DATA:  Infection. EXAM: LEFT FOOT - 2 VIEW COMPARISON:  MRI and radiographs of  June 09, 2018. 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 Soft tissue gas is noted in this area which may represent postsurgical changes or possibly infection. No definite lytic destruction is seen to suggest osteomyelitis currently. Mild posterior calcaneal spurring is noted. 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 Soft tissue gas is noted in this area which may represent postsurgical changes or possibly infection. No definite evidence of osteomyelitis. Electronically Signed   By: Marijo Conception M.D.   On: 08/24/2018 18:20    Positive ROS: All other systems have been reviewed and were otherwise negative with the exception of those mentioned in the HPI and as above.  12 point ROS was performed.  Physical Exam: General: Alert and oriented.  No apparent distress.  Vascular:  Left foot:Dorsalis Pedis:  absent Posterior Tibial:  absent  Right foot: Dorsalis Pedis:  absent Posterior Tibial:  absent  Neuro:absent protective sensation  Derm: Right foot without ulceration.  Left first ray amputation with noted open wound.  Exposed bone of the distal first metatarsal was noted.  Small area of necrosis on the medial aspect of the amputation site.  No active purulent drainage.  He does have cellulitis to his left lower leg from the ankle proximal.  Ortho/MS: Status post left great toe amputation.  Noted edema to the lower extremities.  Assessment: Peripheral vascular disease status post left first ray amputation Diabetes with neuropathy Pneumonia  Plan: There is still exposed bone of the amputation site on the left foot great toe region.  This looks to be stable at this time and not actively infected.  There is no purulent drainage.  Cellulitis is from the ankle proximal.  This looks to be stable.  No gas in the soft tissue.  The gas seen on the x-ray was the open wound and ulceration  and surgical changes.  This likely will need further surgical revision but patient needs to be medically stabilized and likely should be seen by vascular surgery prior to revision again.  He has undergone revascularization by vascular surgery in the past.  At this time just continue with local wound care to the left foot.  Follow-up with Dr. Caryl Comes in the outpatient clinic.  Minimal weightbearing to the left foot.  Can weight-bear to the heel at this time for balance.  No surgical intervention needed at this time.    Elesa Hacker, DPM Cell [phone]   08/25/2018 4:01 PM

## 2018-08-26 LAB — BASIC METABOLIC PANEL
Anion gap: 8 (ref 5–15)
BUN: 29 mg/dL — ABNORMAL HIGH (ref 8–23)
CO2: 22 mmol/L (ref 22–32)
Calcium: 8.7 mg/dL — ABNORMAL LOW (ref 8.9–10.3)
Chloride: 104 mmol/L (ref 98–111)
Creatinine, Ser: 1.13 mg/dL (ref 0.61–1.24)
GFR calc Af Amer: >60 mL/min (ref >60)
GFR calc non Af Amer: 59 mL/min — ABNORMAL LOW (ref >60)
Glucose, Bld: 348 mg/dL — ABNORMAL HIGH (ref 70–99)
Potassium: 4.2 mmol/L (ref 3.5–5.1)
Sodium: 134 mmol/L — ABNORMAL LOW (ref 135–145)

## 2018-08-26 LAB — GLUCOSE, CAPILLARY
Glucose-Capillary: 128 mg/dL — ABNORMAL HIGH (ref 70–99)
Glucose-Capillary: 249 mg/dL — ABNORMAL HIGH (ref 70–99)
Glucose-Capillary: 274 mg/dL — ABNORMAL HIGH (ref 70–99)
Glucose-Capillary: 250 mg/dL — ABNORMAL HIGH (ref 70–99)

## 2018-08-26 MED ORDER — SODIUM CHLORIDE 0.9 % IV SOLN
INTRAVENOUS | Status: DC | PRN
  Administered 2018-08-26: 250 mL via INTRAVENOUS
  Administered 2018-08-26: 100 mL via INTRAVENOUS
  Administered 2018-08-27: 09:00:00 250 mL via INTRAVENOUS

## 2018-08-26 MED ORDER — TORSEMIDE 20 MG PO TABS
40.0000 mg | ORAL_TABLET | Freq: Two times a day (BID) | ORAL | Status: DC
  Administered 2018-08-27: 40 mg via ORAL
  Filled 2018-08-26 (×2): qty 2

## 2018-08-26 NOTE — Progress Notes (Signed)
[facility] [place] NAME: [name]    MR#:  [id number]  DATE OF BIRTH:  1931/09/22  SUBJECTIVE:   Patient here due to suspected sepsis from pneumonia.  Shortness of breath improved since yesterday.  Afebrile.  Seen by podiatry and as per them the findings on x-ray are postsurgical and no acute surgical intervention needed.  REVIEW OF SYSTEMS:    Review of Systems  Constitutional: Negative for chills and fever.  HENT: Negative for congestion and tinnitus.   Eyes: Negative for blurred vision and double vision.  Respiratory: Negative for cough, shortness of breath and wheezing.   Cardiovascular: Negative for chest pain, orthopnea and PND.  Gastrointestinal: Negative for abdominal pain, diarrhea, nausea and vomiting.  Genitourinary: Negative for dysuria and hematuria.  Neurological: Positive for weakness (generalized). Negative for dizziness, sensory change and focal weakness.  All other systems reviewed and are negative.   Nutrition: Heart Healthy/carb modified Tolerating Diet: yes Tolerating PT: Eval noted.   DRUG ALLERGIES:   Allergies  Allergen Reactions  . Contrast Media [Iodinated Diagnostic Agents] Shortness Of Breath  . Morphine And Related Other (See Comments)    Hallucinations   . Niacin And Related Dermatitis  . Other     Other reaction(s): SHORTNESS OF BREATH  . Amlodipine Rash    VITALS:  Blood pressure 134/71, pulse 94, temperature 97.6 F (36.4 C), temperature source Oral, resp. rate 17, height _0  (1.676 m), weight 86 kg, SpO2 100 %.  PHYSICAL EXAMINATION:   Physical Exam  GENERAL:  85 y.o.-year-old patient lying in bed in no acute distress.  EYES: Pupils equal, round, reactive to light and accommodation. No scleral icterus. Extraocular muscles intact.  HEENT: Head atraumatic, normocephalic. Oropharynx and nasopharynx clear.  NECK:  Supple, no jugular venous distention. No thyroid enlargement, no  tenderness.  LUNGS: Normal breath sounds bilaterally, no wheezing, rales, rhonchi. No use of accessory muscles of respiration.  CARDIOVASCULAR: S1, S2 normal. No murmurs, rubs, or gallops.  ABDOMEN: Soft, nontender, nondistended. Bowel sounds present. No organomegaly or mass.  EXTREMITIES: No cyanosis, clubbing, left leg/foot dressing in place with no acute drainage. Some redness on right lower leg due to chronic venous stasis.  NEUROLOGIC: Cranial nerves II through XII are intact. No focal Motor or sensory deficits b/l.  Globally weak.  PSYCHIATRIC: The patient is alert and oriented x 3.  SKIN: No obvious rash, lesion, or ulcer.    LABORATORY PANEL:   CBC Recent Labs  Lab 08/25/18 0707  WBC 13.6*  HGB 12.8*  HCT 41.4  PLT 215   ------------------------------------------------------------------------------------------------------------------  Chemistries  Recent Labs  Lab 08/24/18 1749 08/25/18 0707  NA 138 139  K 4.8 4.0  CL 102 107  CO2 26 24  GLUCOSE 224* 106*  BUN 36* 30*  CREATININE 1.48* 1.29*  CALCIUM 8.7* 8.5*  AST 20  --   ALT 22  --   ALKPHOS 72  --   BILITOT 0.6  --    ------------------------------------------------------------------------------------------------------------------  Cardiac Enzymes Recent Labs  Lab 08/24/18 1749  TROPONINI <0.03   ------------------------------------------------------------------------------------------------------------------  RADIOLOGY:  US Venous Img Lower Unilateral Left  Result Date: 08/24/2018 CLINICAL DATA:  Left lower extremity swelling. EXAM: Left LOWER EXTREMITY VENOUS DOPPLER ULTRASOUND TECHNIQUE: Gray-scale sonography with graded compression, as well as color Doppler and duplex ultrasound were performed to evaluate the lower extremity deep venous systems from the level of the common femoral vein and including the common femoral,  femoral, profunda femoral, popliteal and calf veins including the posterior  tibial, peroneal and gastrocnemius veins when visible. The superficial great saphenous vein was also interrogated. Spectral Doppler was utilized to evaluate flow at rest and with distal augmentation maneuvers in the common femoral, femoral and popliteal veins. COMPARISON:  None. FINDINGS: Contralateral Common Femoral Vein: Respiratory phasicity is normal and symmetric with the symptomatic side. No evidence of thrombus. Normal compressibility. Common Femoral Vein: No evidence of thrombus. Normal compressibility, respiratory phasicity and response to augmentation. Saphenofemoral Junction: No evidence of thrombus. Normal compressibility and flow on color Doppler imaging. Profunda Femoral Vein: No evidence of thrombus. Normal compressibility and flow on color Doppler imaging. Femoral Vein: No evidence of thrombus. Normal compressibility, respiratory phasicity and response to augmentation. Popliteal Vein: No evidence of thrombus. Normal compressibility, respiratory phasicity and response to augmentation. Calf Veins: No evidence of thrombus. Normal compressibility and flow on color Doppler imaging. Venous Reflux:  None. Other Findings:  Edema is noted in the subcutaneous tissues. IMPRESSION: No definite evidence of deep venous thrombosis seen in the left lower extremity. Electronically Signed   By: Marijo Conception M.D.   On: 08/24/2018 19:51   Dg Chest Portable 1 View  Result Date: 08/24/2018 CLINICAL DATA:  Shortness of breath. EXAM: PORTABLE CHEST 1 VIEW COMPARISON:  Radiograph of Aug 11, 2018. FINDINGS: Stable cardiomegaly. Atherosclerosis of thoracic aorta is noted. Increased reticular densities are noted throughout both lungs concerning for worsening edema or possibly atypical inflammation superimposed upon chronic interstitial disease. No pneumothorax or pleural effusion is noted. Bony thorax is unremarkable. IMPRESSION: Stable cardiomegaly. Increased reticular densities are noted throughout both lungs concerning  for superimposed edema or atypical inflammation. Aortic Atherosclerosis (ICD10-I70.0). Electronically Signed   By: Marijo Conception M.D.   On: 08/24/2018 17:44   Dg Foot 2 Views Left  Result Date: 08/24/2018 CLINICAL DATA:  Infection. EXAM: LEFT FOOT - 2 VIEW COMPARISON:  MRI and radiographs of June 09, 2018. 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 Soft tissue gas is noted in this area which may represent postsurgical changes or possibly infection. No definite lytic destruction is seen to suggest osteomyelitis currently. Mild posterior calcaneal spurring is noted. 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 Soft tissue gas is noted in this area which may represent postsurgical changes or possibly infection. No definite evidence of osteomyelitis. Electronically Signed   By: Marijo Conception M.D.   On: 08/24/2018 18:20     ASSESSMENT AND PLAN:   85 year old male with past medical history of hypertension, hyperlipidemia, history of previous CVA, COPD, chronic diastolic CHF, chronic atrial fibrillation, osteoarthritis, neuropathy who presented to the hospital due to shortness of breath.  1.  Sepsis- patient met criteria admission given leukocytosis, elevated lactic acid and chest x-ray findings suggestive of suspected pneumonia. - Continue empiric antibiotics with IV cefepime.  MRSA PCR was negative therefore vancomycin discontinued. -Cultures so far negative.  Clinically patient is improving.  2.  Pneumonia-source of patient's sepsis. -Continue empiric cefepime.  Follow cultures which are currently negative.  3.  Left foot PVD/ulcer-patient is status post left fifth ray amputation. - He has some ascending redness but it is not warm to touch and clinically no evidence of cellulitis. -Seen by podiatry and the x-ray findings and clinical exam is more consistent with postsurgical changes.   Patient at some point will need some debridement and further vascular intervention but not during  this hospitalization. -Continue supportive care for now.  4.  COPD-no acute exacerbation.  Continue Dulera, Incruse Ellipta -Continue maintenance prednisone.  5.  Diabetes type 2 with PVD-continue sliding scale insulin. - Blood sugar stable  6.  Hypothyroidism-continue Synthroid.  7.  Essential hypertension-continue Toprol  8.  History of previous atrial fibrillation-rate controlled.  Continue Eliquis, Toprol.  9.  Anxiety-continue Xanax as needed.  10.  BPH-no urine retention.  Continue Flomax.   All the records are reviewed and case discussed with Care Management/Social Worker. Management plans discussed with the patient, family and they are in agreement.  CODE STATUS: Full code  DVT Prophylaxis: Eliquis  TOTAL TIME TAKING CARE OF THIS PATIENT: 30 minutes.   POSSIBLE D/C IN 1-2 DAYS, DEPENDING ON CLINICAL CONDITION.   Henreitta Leber M.D on 08/26/2018 at 2:48 PM  Between 7am to 6pm - Pager - [phone]  After 6pm go to [email or web address] - [employer] [facility]  Office  [phone]  CC: Primary care physician; Jearld Fenton, NP

## 2018-08-26 NOTE — Progress Notes (Addendum)
Physical Therapy Evaluation Patient Details Name: [name]. MRN: [id number] DOB: 1931/08/13 Today's Date: 08/26/2018   History of Present Illness  [name]  is a 85 y.o. male who presents to the ED with a complaint of shortness of breath.  He is found on evaluation to have edema versus pneumonia.  He was recently here in the hospital for diabetic toe infection.  He has been on antibiotics since that time for his infection.  Blood work here he has a significant leukocytosis with leftward shift, and is tachypneic and tachycardic.  He does have a history of chronic diastolic heart failure, though his BNP tonight is around 300, which is consistent with prior values in our system.  He does met sepsis criteria and was admitted for sepsis secondary to HCAP.   Clinical Impression  Pt admitted with above diagnosis. Pt currently with functional limitations due to the deficits listed below (see PT Problem List).  Pt is independent with bed mobility. He requires a couple attempts to come to standing. Safe hand placement demonstrated but BLE weakness noted during transfers. Transfers performed to/from bed and BSC. He is able to ambulate partilaly around RN station with a rolling walker. Heel-wedge shoe donned on LLE to minimize WB through L forefoot. Pt on 2L/min O2 for ambulation via [place]. SaO2 starts at 99% and drops to 95% during ambulation on 2L/min O2. HR increases to 130-140 bpm. Pt is safe to return home when medically appropriate but will need HH PT. No DME needs. Pt will benefit from PT services to address deficits in strength, balance, and mobility in order to return to full function at home.      Follow Up Recommendations Home health PT    Equipment Recommendations  None recommended by PT    Recommendations for Other Services       Precautions / Restrictions Precautions Precautions: Fall Restrictions Weight Bearing Restrictions: No      Mobility  Bed Mobility Overal bed  mobility: Modified Independent             General bed mobility comments: HOB elevated and use of bed rails  Transfers Overall transfer level: Needs assistance Equipment used: Rolling walker (2 wheeled) Transfers: Sit to/from Stand Sit to Stand: Min guard         General transfer comment: Pt requires a couple attempts to come to standing. Safe hand placement demonstrated but BLE weakness noted. Transfers performed to/from bed and [facility]  Ambulation/Gait Ambulation/Gait assistance: Min guard Gait Distance (Feet): 120 Feet Assistive device: Rolling walker (2 wheeled)       General Gait Details: Pt is able to ambulate partilaly around RN station with a rolling walker. Heel-wedge shoe donned on LLE to minimize WB through L forefoot. Pt on 2L/min O2 for ambulation via [place]. SaO2 starts at 99% and drops to 95% during ambulation on 2L/min O2. HR increases during ambulation to 130-140 bpm.   Stairs            Wheelchair Mobility    Modified Rankin (Stroke Patients Only)       Balance Overall balance assessment: Needs assistance;History of Falls Sitting-balance support: No upper extremity supported Sitting balance-Leahy Scale: Good     Standing balance support: No upper extremity supported Standing balance-Leahy Scale: Fair                               Pertinent Vitals/Pain Pain Assessment: 0-10 Pain  Score: 3  Pain Location: R forearm Pain Intervention(s): Monitored during session    Home Living Family/patient expects to be discharged to:: Private residence Living Arrangements: Spouse/significant other;Other relatives(Niece stays intermittently) Available Help at Discharge: Family(Daughter and Niece) Type of Home: House Home Access: Stairs to enter Entrance Stairs-Rails: Right;Left;Can reach both Entrance Stairs-Number of Steps: 2 Home Layout: One level Home Equipment: Walker - 2 wheels;Walker - 4 wheels;Cane - single point;Bedside commode;Shower  seat;Shower seat - built in;Grab bars - toilet;Grab bars - tub/shower;Wheelchair - manual;Hospital bed;Other (comment)(Lift recliner) Additional Comments: Niece and dtr provide assistance with household chores, meals and medications/medical needs    Prior Function Level of Independence: Needs assistance   Gait / Transfers Assistance Needed: Pt ambulates in home with rollator vs front wheeled walker. He reports at least 5 falls in the last 12 months, 3 of which occurred since April 12, 2018  ADL's / Homemaking Assistance Needed: Pt states that he requires assistance with ADLs/IADLs from wife and niece/dtr        Hand Dominance   Dominant Hand: Right    Extremity/Trunk Assessment   Upper Extremity Assessment Upper Extremity Assessment: Generalized weakness(Chronic limitation in B shoulder AROM flexion/abduction)    Lower Extremity Assessment Lower Extremity Assessment: Generalized weakness       Communication   Communication: No difficulties  Cognition Arousal/Alertness: Awake/alert Behavior During Therapy: WFL for tasks assessed/performed Overall Cognitive Status: Within Functional Limits for tasks assessed                                        General Comments      Exercises     Assessment/Plan    PT Assessment Patient needs continued PT services  PT Problem List Decreased strength;Decreased activity tolerance;Decreased balance       PT Treatment Interventions DME instruction;Gait training;Stair training;Therapeutic activities;Balance training;Therapeutic exercise;Neuromuscular re-education;Patient/family education    PT Goals (Current goals can be found in the Care Plan section)  Acute Rehab PT Goals Patient Stated Goal: Return to prior level of function at home PT Goal Formulation: With patient Time For Goal Achievement: 09/09/18 Potential to Achieve Goals: Good    Frequency Min 2X/week   Barriers to discharge        Co-evaluation                AM-PAC PT "6 Clicks" Mobility  Outcome Measure Help needed turning from your back to your side while in a flat bed without using bedrails?: None Help needed moving from lying on your back to sitting on the side of a flat bed without using bedrails?: None Help needed moving to and from a bed to a chair (including a wheelchair)?: A Little Help needed standing up from a chair using your arms (e.g., wheelchair or bedside chair)?: None Help needed to walk in hospital room?: A Little Help needed climbing 3-5 steps with a railing? : A Lot 6 Click Score: 20    End of Session Equipment Utilized During Treatment: Gait belt Activity Tolerance: Patient tolerated treatment well Patient left: Other (comment)(Pt left on BSC, pt to use call bell. RN approves) Nurse Communication: Other (comment)(Pt on [facility] next to bed with call bell) PT Visit Diagnosis: Unsteadiness on feet (R26.81);Repeated falls (R29.6);History of falling (Z91.81)    Time: 1638-4536 PT Time Calculation (min) (ACUTE ONLY): 26 min   Charges:   PT Evaluation $PT Eval Low Complexity:  1 Low PT Treatments $Gait Training: 8-22 mins        Lyndel Safe Huprich PT, DPT, GCS   Huprich,Jason 08/26/2018, 2:22 PM

## 2018-08-27 LAB — CBC
HCT: 36.8 % — ABNORMAL LOW (ref 39.0–52.0)
Hemoglobin: 11.8 g/dL — ABNORMAL LOW (ref 13.0–17.0)
MCH: 29 pg (ref 26.0–34.0)
MCHC: 32.1 g/dL (ref 30.0–36.0)
MCV: 90.4 fL (ref 80.0–100.0)
Platelets: 224 10*3/uL (ref 150–400)
RBC: 4.07 MIL/uL — ABNORMAL LOW (ref 4.22–5.81)
RDW: 16.5 % — ABNORMAL HIGH (ref 11.5–15.5)
WBC: 12.5 10*3/uL — ABNORMAL HIGH (ref 4.0–10.5)
nRBC: 0 % (ref 0.0–0.2)

## 2018-08-27 LAB — BASIC METABOLIC PANEL
Anion gap: 8 (ref 5–15)
BUN: 26 mg/dL — ABNORMAL HIGH (ref 8–23)
CO2: 23 mmol/L (ref 22–32)
Calcium: 8.8 mg/dL — ABNORMAL LOW (ref 8.9–10.3)
Chloride: 107 mmol/L (ref 98–111)
Creatinine, Ser: 1.08 mg/dL (ref 0.61–1.24)
GFR calc Af Amer: >60 mL/min (ref >60)
GFR calc non Af Amer: >60 mL/min (ref >60)
Glucose, Bld: 202 mg/dL — ABNORMAL HIGH (ref 70–99)
Potassium: 3.5 mmol/L (ref 3.5–5.1)
Sodium: 138 mmol/L (ref 135–145)

## 2018-08-27 LAB — GLUCOSE, CAPILLARY
Glucose-Capillary: 196 mg/dL — ABNORMAL HIGH (ref 70–99)
Glucose-Capillary: 339 mg/dL — ABNORMAL HIGH (ref 70–99)

## 2018-08-27 MED ORDER — LEVOFLOXACIN 750 MG PO TABS: 750.0000 mg | ORAL_TABLET | Freq: Every day | ORAL | 0 refills | Status: AC

## 2018-08-27 NOTE — Plan of Care (Signed)
Will continue to monitor.

## 2018-08-27 NOTE — Discharge Summary (Signed)
[facility] NAME: [name]    MR#:  [id number]  DATE OF BIRTH:  1932/02/06  DATE OF ADMISSION:  08/24/2018 ADMITTING PHYSICIAN: Lance Coon, MD  DATE OF DISCHARGE: 08/27/2018  1:35 PM  PRIMARY CARE PHYSICIAN: Jearld Fenton, NP    ADMISSION DIAGNOSIS:  Community acquired pneumonia, unspecified laterality [J18.9] Sepsis without acute organ dysfunction, due to unspecified organism ([place]) [A41.9] Sepsis ([place]) [A41.9]  DISCHARGE DIAGNOSIS:  Principal Problem:   Sepsis ([place]) Active Problems:   Hyperlipidemia   Essential hypertension   Coronary artery disease of native artery of native heart with stable angina pectoris ([place])   Chronic atrial fibrillation   Chronic diastolic CHF (congestive heart failure) (HCC)   BPH (benign prostatic hyperplasia)   Anxiety   Diabetes type 2, controlled ([place])   Chronic obstructive pulmonary disease ([place])   Acquired hypothyroidism   HCAP (healthcare-associated pneumonia)   SECONDARY DIAGNOSIS:   Past Medical History:  Diagnosis Date  . Arthritis   . CAD    a. MI 01/29/1996 tx'd w/ TPA @ [facility]; b. Myoview 06/2005: EF 50%, scar @ apex, mild peri-infarct ischemia  . Cancer ([place])    skin  . Chronic atrial fibrillation    a. since 2006; b. on warfarin  . Chronic diastolic CHF (congestive heart failure) ([place])    a. echo 04/2006: EF lower limits of nl, mod LVH, mild aortic root dilatation, & mild MR, biatrial enlargement; b. echo 04/2013: EF 60%, mod dilated LA, mild MR & TR, mod pulm HTN w/ RV systolic pressure 53, c. echo 04/21/14: EF 55-60%, unable to exclude WMA, severely dilated LA 6.6 cm, nl RVSP, mildly dilated aortic root  . COPD (chronic obstructive pulmonary disease) (HCC)    oxygen prn at home  . CVA [id number]   x2  . DM   . Falls   . GERD (gastroesophageal reflux disease)   . History of kidney stones   . HYPERLIPIDEMIA   . HYPERTENSION   . Kidney stone    a. s/p left ureteral  stenting 04/24/14  . Left arm weakness    limited movement. S/P fall injury  . Neuropathy of both feet   . Poor balance   . Wears dentures    full upper and lower    HOSPITAL COURSE:   85 year old male with past medical history of hypertension, hyperlipidemia, history of previous CVA, COPD, chronic diastolic CHF, chronic atrial fibrillation, osteoarthritis, neuropathy who presented to the hospital due to shortness of breath.  1.  Sepsis- patient met criteria on admission given leukocytosis, elevated lactic acid and chest x-ray findings suggestive of suspected pneumonia. -Patient was empirically started on IV vancomycin, cefepime.  Patient's MRSA PCR was negative and therefore vancomycin discontinued.  Patient's cultures have remained negative and he has clinically improved.  He is to be discharged on oral Levaquin for a few more days. -Lactate level has normalized.  2.  Pneumonia-source of patient's sepsis. As mentioned above initially treated with broad-spectrum IV antibiotics with vancomycin, cefepime.  Cultures remain negative and he is clinically improved and therefore being discharged on oral Levaquin for a few more days.  3.  Left foot PVD/ulcer-patient is status post left fifth ray amputation. - He has some ascending redness but it is not warm to touch and clinically no evidence of cellulitis. -Seen by podiatry and the x-ray findings and clinical exam is more consistent with postsurgical changes.  Patient at some point will need some  debridement and further vascular intervention but not during this hospitalization. Pt. Will cont. Follow up with Podiatry, Vascular surgery as outpatient.   4.  COPD-no acute exacerbation.  Continue Symbicort, Incruse Ellipta -Continue maintenance prednisone.  5.  Diabetes type 2 with PVD-cont. Maintenance meds as stated below.   6.  Hypothyroidism-continue Synthroid.  7.  Essential hypertension-continue Toprol  8.  History of previous  atrial fibrillation-rate controlled.  Continue Eliquis, Toprol.  9.  Anxiety-continue Xanax as needed.  10.  BPH-no urine retention.  Continue Flomax.  Patient is going to be discharged home with home health nursing, physical therapy.  DISCHARGE CONDITIONS:   Stable.   CONSULTS OBTAINED:    DRUG ALLERGIES:   Allergies  Allergen Reactions  . Contrast Media [Iodinated Diagnostic Agents] Shortness Of Breath  . Morphine And Related Other (See Comments)    Hallucinations   . Niacin And Related Dermatitis  . Other     Other reaction(s): SHORTNESS OF BREATH  . Amlodipine Rash    DISCHARGE MEDICATIONS:   Allergies as of 08/27/2018      Reactions   Contrast Media [iodinated Diagnostic Agents] Shortness Of Breath   Morphine And Related Other (See Comments)   Hallucinations   Niacin And Related Dermatitis   Other    Other reaction(s): SHORTNESS OF BREATH   Amlodipine Rash      Medication List    STOP taking these medications   cephALEXin 500 MG capsule Commonly known as:  KEFLEX   ciprofloxacin 250 MG tablet Commonly known as:  Cipro   glipiZIDE 5 MG tablet Commonly known as:  GLUCOTROL     TAKE these medications   acetaminophen 650 MG CR tablet Commonly known as:  TYLENOL Take 650 mg by mouth every 8 (eight) hours as needed for pain.   albuterol 108 (90 Base) MCG/ACT inhaler Commonly known as:  ProAir HFA USE 1 TO 2 INHALATIONS EVERY 4 HOURS AS NEEDED What changed:    how much to take  how to take this  when to take this  reasons to take this  additional instructions   ALPRAZolam 0.25 MG tablet Commonly known as:  XANAX Take 1 tablet (0.25 mg total) by mouth at bedtime as needed for anxiety.   apixaban 2.5 MG Tabs tablet Commonly known as:  ELIQUIS Take 1 tablet (2.5 mg total) by mouth 2 (two) times daily.   atorvastatin 40 MG tablet Commonly known as:  LIPITOR TAKE 1 TABLET DAILY What changed:  when to take this   citalopram 10 MG  tablet Commonly known as:  CELEXA TAKE 1 TABLET DAILY   clotrimazole 1 % cream Commonly known as:  LOTRIMIN Apply 1 application topically 2 (two) times daily.   Colcrys 0.6 MG tablet Generic drug:  colchicine TAKE 1 TABLET TWICE A DAY AS NEEDED (ACUTE GOUT FLARE) USE AS DIRECTED   Combigan 0.2-0.5 % ophthalmic solution Generic drug:  brimonidine-timolol Place 1 drop into both eyes daily.   diclofenac sodium 1 % Gel Commonly known as:  VOLTAREN Apply 4 g topically 4 (four) times daily.   dorzolamide 2 % ophthalmic solution Commonly known as:  TRUSOPT Place 1 drop into both eyes 3 (three) times daily.   ezetimibe 10 MG tablet Commonly known as:  ZETIA TAKE 1 TABLET DAILY   finasteride 5 MG tablet Commonly known as:  PROSCAR TAKE 1 TABLET DAILY   gabapentin 100 MG capsule Commonly known as:  NEURONTIN TAKE 1 CAPSULE THREE TIMES A DAY What changed:  when to take this   Incruse Ellipta 62.5 MCG/INH Aepb Generic drug:  umeclidinium bromide Inhale 1 puff into the lungs daily.   insulin detemir 100 UNIT/ML injection Commonly known as:  LEVEMIR Inject 0.28 mLs (28 Units total) into the skin daily at 10 pm. What changed:  how much to take   insulin lispro 100 UNIT/ML injection Commonly known as:  HumaLOG Inject 0.04 mLs (4 Units total) into the skin 3 (three) times daily with meals. What changed:    how much to take  additional instructions   Insulin Pen Needle 32G X 4 MM Misc Commonly known as:  BD Pen Needle Nano U/F USE THREE TIMES A DAY FOR INSULIN ADMINISTRATION   Insulin Syringe-Needle U-100 30G X 1/2" 1 ML Misc 1 each by Does not apply route 3 (three) times daily.   latanoprost 0.005 % ophthalmic solution Commonly known as:  XALATAN Place 1 drop into both eyes at bedtime.   levofloxacin 750 MG tablet Commonly known as:  LEVAQUIN Take 1 tablet (750 mg total) by mouth daily for 5 days.   levothyroxine 50 MCG tablet Commonly known as:  SYNTHROID TAKE  1 TABLET DAILY BEFORE BREAKFAST   metolazone 5 MG tablet Commonly known as:  ZAROXOLYN Take 1 tablet (5 mg total) by mouth daily as needed (swelling).   metoprolol succinate 50 MG 24 hr tablet Commonly known as:  TOPROL-XL TAKE 1 TABLET TWICE A DAY   montelukast 10 MG tablet Commonly known as:  SINGULAIR TAKE 1 TABLET AT BEDTIME   nitroGLYCERIN 0.4 MG SL tablet Commonly known as:  NITROSTAT Place 0.4 mg under the tongue every 5 (five) minutes as needed.   nystatin-triamcinolone cream Commonly known as:  MYCOLOG II Apply 1 application topically 2 (two) times daily.   potassium chloride SA 20 MEQ tablet Commonly known as:  K-DUR Take 4 tablets (80 meq) by mouth twice daily, take an extra 1 tablet (20 meq) on the days you take metolazone What changed:    how much to take  how to take this  when to take this  additional instructions   predniSONE 10 MG tablet Commonly known as:  DELTASONE Take 1 tablet (10 mg total) by mouth daily with breakfast.   Symbicort 160-4.5 MCG/ACT inhaler Generic drug:  budesonide-formoterol USE 2 INHALATIONS TWICE A DAY What changed:  See the new instructions.   tamsulosin 0.4 MG Caps capsule Commonly known as:  FLOMAX TAKE 1 CAPSULE DAILY   torsemide 20 MG tablet Commonly known as:  DEMADEX Take 40 mg by mouth 2 (two) times daily.   traMADol 50 MG tablet Commonly known as:  ULTRAM Take 1 tablet (50 mg total) by mouth 2 (two) times daily.   traZODone 50 MG tablet Commonly known as:  DESYREL Take 2 tablets (100 mg total) by mouth at bedtime as needed for sleep.         DISCHARGE INSTRUCTIONS:   DIET:  Cardiac diet and Diabetic diet  DISCHARGE CONDITION:  Stable  ACTIVITY:  Activity as tolerated  OXYGEN:  Home Oxygen: Yes.     Oxygen Delivery: 2 liters/min via Patient connected to nasal cannula oxygen  DISCHARGE LOCATION:  Home with Home Health RN, PT   If you experience worsening of your admission symptoms,  develop shortness of breath, life threatening emergency, suicidal or homicidal thoughts you must seek medical attention immediately by calling 911 or calling your MD immediately  if symptoms less severe.  You Must read complete instructions/literature along  with all the possible adverse reactions/side effects for all the Medicines you take and that have been prescribed to you. Take any new Medicines after you have completely understood and accpet all the possible adverse reactions/side effects.   Please note  You were cared for by a hospitalist during your hospital stay. If you have any questions about your discharge medications or the care you received while you were in the hospital after you are discharged, you can call the unit and asked to speak with the hospitalist on call if the hospitalist that took care of you is not available. Once you are discharged, your primary care physician will handle any further medical issues. Please note that NO REFILLS for any discharge medications will be authorized once you are discharged, as it is imperative that you return to your primary care physician (or establish a relationship with a primary care physician if you do not have one) for your aftercare needs so that they can reassess your need for medications and monitor your lab values.     Today   Overall feels better, shortness of breath is improved.  Afebrile, cultures are negative.  Will discharge home on oral antibiotics today.  VITAL SIGNS:  Blood pressure (!) 135/94, pulse 83, temperature 98.1 F (36.7 C), temperature source Oral, resp. rate 18, height 5' 6"  (1.676 m), weight 86.3 kg, SpO2 99 %.  I/O:    Intake/Output Summary (Last 24 hours) at 08/27/2018 1603 Last data filed at 08/27/2018 1235 Gross per 24 hour  Intake 139.49 ml  Output 1860 ml  Net -1720.51 ml    PHYSICAL EXAMINATION:   GENERAL:  85 y.o.-year-old patient lying in bed in no acute distress.  EYES: Pupils equal, round,  reactive to light and accommodation. No scleral icterus. Extraocular muscles intact.  HEENT: Head atraumatic, normocephalic. Oropharynx and nasopharynx clear.  NECK:  Supple, no jugular venous distention. No thyroid enlargement, no tenderness.  LUNGS: Normal breath sounds bilaterally, no wheezing, rales, rhonchi. No use of accessory muscles of respiration.  CARDIOVASCULAR: S1, S2 normal. No murmurs, rubs, or gallops.  ABDOMEN: Soft, nontender, nondistended. Bowel sounds present. No organomegaly or mass.  EXTREMITIES: No cyanosis, clubbing, left leg/foot dressing in place with no acute drainage. Some redness on right lower leg due to chronic venous stasis.  NEUROLOGIC: Cranial nerves II through XII are intact. No focal Motor or sensory deficits b/l.  Globally weak.  PSYCHIATRIC: The patient is alert and oriented x 3.  SKIN: No obvious rash, lesion, or ulcer.   DATA REVIEW:   CBC Recent Labs  Lab 08/27/18 0452  WBC 12.5*  HGB 11.8*  HCT 36.8*  PLT 224    Chemistries  Recent Labs  Lab 08/24/18 1749  08/27/18 0452  NA 138   < > 138  K 4.8   < > 3.5  CL 102   < > 107  CO2 26   < > 23  GLUCOSE 224*   < > 202*  BUN 36*   < > 26*  CREATININE 1.48*   < > 1.08  CALCIUM 8.7*   < > 8.8*  AST 20  --   --   ALT 22  --   --   ALKPHOS 72  --   --   BILITOT 0.6  --   --    < > = values in this interval not displayed.    Cardiac Enzymes Recent Labs  Lab 08/24/18 [place] <0.03    Microbiology Results  Results for orders placed or performed during the hospital encounter of 08/24/18  Culture, blood (Routine x 2)     Status: None (Preliminary result)   Collection Time: 08/24/18  5:50 PM  Result Value Ref Range Status   Specimen Description BLOOD LEFT ANTECUBITAL  Final   Special Requests   Final    BOTTLES DRAWN AEROBIC AND ANAEROBIC Blood Culture adequate volume   Culture   Final    NO GROWTH 3 DAYS Performed at [facility], [address], [place],  [place] [address]    Report Status PENDING  Incomplete  SARS Coronavirus 2 (CEPHEID - Performed in [facility] hospital lab), Hosp Order     Status: None   Collection Time: 08/24/18  6:39 PM  Result Value Ref Range Status   SARS Coronavirus 2 NEGATIVE NEGATIVE Final    Comment: (NOTE) If result is NEGATIVE SARS-CoV-2 target nucleic acids are NOT DETECTED. The SARS-CoV-2 RNA is generally detectable in upper and lower  respiratory specimens during the acute phase of infection. The lowest  concentration of SARS-CoV-2 viral copies this assay can detect is 250  copies / mL. A negative result does not preclude SARS-CoV-2 infection  and should not be used as the sole basis for treatment or other  patient management decisions.  A negative result may occur with  improper specimen collection / handling, submission of specimen other  than nasopharyngeal swab, presence of viral mutation(s) within the  areas targeted by this assay, and inadequate number of viral copies  (<250 copies / mL). A negative result must be combined with clinical  observations, patient history, and epidemiological information. If result is POSITIVE SARS-CoV-2 target nucleic acids are DETECTED. The SARS-CoV-2 RNA is generally detectable in upper and lower  respiratory specimens dur ing the acute phase of infection.  Positive  results are indicative of active infection with SARS-CoV-2.  Clinical  correlation with patient history and other diagnostic information is  necessary to determine patient infection status.  Positive results do  not rule out bacterial infection or co-infection with other viruses. If result is PRESUMPTIVE POSTIVE SARS-CoV-2 nucleic acids MAY BE PRESENT.   A presumptive positive result was obtained on the submitted specimen  and confirmed on repeat testing.  While 2019 novel coronavirus  (SARS-CoV-2) nucleic acids may be present in the submitted sample  additional confirmatory testing may be necessary for  epidemiological  and / or clinical management purposes  to differentiate between  SARS-CoV-2 and other Sarbecovirus currently known to infect humans.  If clinically indicated additional testing with an alternate test  methodology [phone]) is advised. The SARS-CoV-2 RNA is generally  detectable in upper and lower respiratory sp ecimens during the acute  phase of infection. The expected result is Negative. Fact Sheet for Patients:  StrictlyIdeas.no Fact Sheet for Healthcare Providers: BankingDealers.co.za This test is not yet approved or cleared by the [place] FDA and has been authorized for detection and/or diagnosis of SARS-CoV-2 by FDA under an Emergency Use Authorization (EUA).  This EUA will remain in effect (meaning this test can be used) for the duration of the COVID-19 declaration under Section 564(b)(1) of the Act, 21 U.S.C. section 360bbb-3(b)(1), unless the authorization is terminated or revoked sooner. Performed at [facility], [address] [place], [place] [address]   Culture, blood (Routine x 2)     Status: None (Preliminary result)   Collection Time: 08/24/18  7:05 PM  Result Value Ref Range Status   Specimen Description  BLOOD WRIST  Final   Special Requests   Final    BOTTLES DRAWN AEROBIC AND ANAEROBIC Blood Culture adequate volume   Culture   Final    NO GROWTH 3 DAYS Performed at [facility], [address] [place], [place] [address]    Report Status PENDING  Incomplete  MRSA PCR Screening     Status: None   Collection Time: 08/25/18  7:45 AM  Result Value Ref Range Status   MRSA by PCR NEGATIVE NEGATIVE Final    Comment:        The GeneXpert MRSA Assay (FDA approved for NASAL specimens only), is one component of a comprehensive MRSA colonization surveillance program. It is not intended to diagnose MRSA infection nor to guide or monitor treatment for MRSA  infections. Performed at [facility], [address], [place], [place] [address]     RADIOLOGY:  No results found.    Management plans discussed with the patient, family and they are in agreement.  CODE STATUS:     Code Status Orders  (From admission, onward)         Start     Ordered   08/25/18 0248  Full code  Continuous     08/25/18 0247       TOTAL TIME TAKING CARE OF THIS PATIENT: 40 minutes.    Henreitta Leber M.D on 08/27/2018 at 4:03 PM  Between 7am to 6pm - Pager - [phone]  After 6pm go to [email or web address] - Technical brewer [facility]  Office  [phone]  CC: Primary care physician; Jearld Fenton, NP

## 2018-08-27 NOTE — Consult Note (Signed)
Pharmacy Antibiotic Note  [name]. is a 85 y.o. male admitted on 08/24/2018 with sepsis suspected PNA.  Pharmacy has been consulted for vancomycin and cefepime dosing.  Plan: Continue cefepime 2g IV every 12 hours.    Height: 5\' 6"  (167.6 cm) Weight: 190 lb 4.1 oz (86.3 kg) IBW/kg (Calculated) : 63.8  Temp (24hrs), Avg:97.9 F (36.6 C), Min:97.6 F (36.4 C), Max:98.1 F (36.7 C)  Recent Labs  Lab 08/24/18 1749 08/24/18 2035 08/25/18 0012 08/25/18 0707 08/26/18 1814 08/27/18 0452  WBC 17.0*  --   --  13.6*  --  12.5*  CREATININE 1.48*  --   --  1.29* 1.13 1.08  LATICACIDVEN 2.3* 2.1* 1.9  --   --   --     Estimated Creatinine Clearance: 52.4 mL/min (by C-G formula based on SCr of 1.08 mg/dL).    Allergies  Allergen Reactions  . Contrast Media [Iodinated Diagnostic Agents] Shortness Of Breath  . Morphine And Related Other (See Comments)    Hallucinations   . Niacin And Related Dermatitis  . Other     Other reaction(s): SHORTNESS OF BREATH  . Amlodipine Rash    Antimicrobials this admission: 5/14 azithromycin/ceftriaxone  >> x1   5/14 vancomycin  >> 5/15 5/14 Cefepime >>   Microbiology results: 5/14 BCx: pending 5/14 SARS Coronavirus 2: negative 5/15 MRSA PCR neg   Thank you for allowing pharmacy to be a part of this patient's care.  Oswald Hillock, PharmD, BCPS Clinical Pharmacist 08/27/2018 10:39 AM

## 2018-08-27 NOTE — TOC Transition Note (Signed)
Transition of Care [facility]) - CM/SW Discharge Note   Patient Details  Name: [name]. MRN: [id number] Date of Birth: [birth year]  [facility] of [facility] CM/SW Contact:  Latanya Maudlin, RN Phone Number: 08/27/2018, 2:02 PM   Clinical Narrative:   Patient to be discharged per MD order. Orders in place for home health services. [facility] active with [facility]. Called main line and spoke with representative and was able to pass on the referral to the on call nurse. No documents were needed. No DME needs. Family to transport.     Final next level of care: Home w Home Health Services Barriers to Discharge: No Barriers Identified   Patient Goals and CMS Choice   CMS Medicare.gov Compare Post Acute Care list provided to:: Patient    Discharge Placement                       Discharge Plan and Services     Post Acute Care Choice: Home Health, Resumption of Svcs/PTA Provider                    HH Arranged: RN, PT, Nurse's Aide [place] Agency: [place] Date [place]: 08/27/18 Time [place]: 70 Representative spoke with at [place]: [facility] main line  Social Determinants of Health (SDOH) Interventions     Readmission Risk Interventions No flowsheet data found.

## 2018-08-27 NOTE — Progress Notes (Signed)
1308 08/27/2018  Discharge process complete. AVS reviewed with patient. He has no further questions or concerns. Vital signs stable, PIV removed and personal items gathered and taken with patient. Dressings chnged prior to patient's D/C. Wheel chair escort off unit by staff member. Patient off unit. Care relinquished.

## 2018-08-28 ENCOUNTER — Telehealth: Payer: Self-pay | Admitting: Internal Medicine

## 2018-08-28 NOTE — Telephone Encounter (Signed)
He needs to set up an TCM hospital follow up. Need to make sure TCM hospital call has been completed.

## 2018-08-28 NOTE — Telephone Encounter (Signed)
Patient's Daughter Butch Penny is requesting a call back She has some questions about his medications  He was just released from the hospital and his medications have changed.   She would also like to see if a referral could be placed to order the patient a hospital bed.  PHONE- [phone]

## 2018-08-29 ENCOUNTER — Encounter: Payer: Self-pay | Admitting: Internal Medicine

## 2018-08-29 ENCOUNTER — Ambulatory Visit: Payer: Medicare Other | Admitting: Internal Medicine

## 2018-08-29 VITALS — BP 123/70 | Temp 97.9°F | Resp 20 | Wt 187.0 lb

## 2018-08-29 DIAGNOSIS — A419 Sepsis, unspecified organism: Secondary | ICD-10-CM

## 2018-08-29 DIAGNOSIS — S91302D Unspecified open wound, left foot, subsequent encounter: Secondary | ICD-10-CM | POA: Diagnosis not present

## 2018-08-29 DIAGNOSIS — L02416 Cutaneous abscess of left lower limb: Secondary | ICD-10-CM

## 2018-08-29 DIAGNOSIS — L03116 Cellulitis of left lower limb: Secondary | ICD-10-CM

## 2018-08-29 DIAGNOSIS — E1142 Type 2 diabetes mellitus with diabetic polyneuropathy: Secondary | ICD-10-CM

## 2018-08-29 DIAGNOSIS — S51801D Unspecified open wound of right forearm, subsequent encounter: Secondary | ICD-10-CM | POA: Diagnosis not present

## 2018-08-29 DIAGNOSIS — J189 Pneumonia, unspecified organism: Secondary | ICD-10-CM

## 2018-08-29 LAB — CULTURE, BLOOD (ROUTINE X 2)
Culture: NO GROWTH
Culture: NO GROWTH
Special Requests: ADEQUATE
Special Requests: ADEQUATE

## 2018-08-29 NOTE — Patient Instructions (Signed)
Community-Acquired Pneumonia, Adult  Pneumonia is an infection of the lungs. It causes swelling in the airways of the lungs. Mucus and fluid may also build up inside the airways.  One type of pneumonia can happen while a person is in a hospital. A different type can happen when a person is not in a hospital (community-acquired pneumonia).   What are the causes?    This condition is caused by germs (viruses, bacteria, or fungi). Some types of germs can be passed from one person to another. This can happen when you breathe in droplets from the cough or sneeze of an infected person.  What increases the risk?  You are more likely to develop this condition if you:   Have a long-term (chronic) disease, such as:  ? Chronic obstructive pulmonary disease (COPD).  ? Asthma.  ? Cystic fibrosis.  ? Congestive heart failure.  ? Diabetes.  ? Kidney disease.   Have HIV.   Have sickle cell disease.   Have had your spleen removed.   Do not take good care of your teeth and mouth (poor dental hygiene).   Have a medical condition that increases the risk of breathing in droplets from your own mouth and nose.   Have a weakened body defense system (immune system).   Are a smoker.   Travel to areas where the germs that cause this illness are common.   Are around certain animals or the places they live.  What are the signs or symptoms?   A dry cough.   A wet (productive) cough.   Fever.   Sweating.   Chest pain. This often happens when breathing deeply or coughing.   Fast breathing or trouble breathing.   Shortness of breath.   Shaking chills.   Feeling tired (fatigue).   Muscle aches.  How is this treated?  Treatment for this condition depends on many things. Most adults can be treated at home. In some cases, treatment must happen in a hospital. Treatment may include:   Medicines given by mouth or through an IV tube.   Being given extra oxygen.   Respiratory therapy.  In rare cases, treatment for very bad pneumonia  may include:   Using a machine to help you breathe.   Having a procedure to remove fluid from around your lungs.  Follow these instructions at home:  Medicines   Take over-the-counter and prescription medicines only as told by your doctor.  ? Only take cough medicine if you are losing sleep.   If you were prescribed an antibiotic medicine, take it as told by your doctor. Do not stop taking the antibiotic even if you start to feel better.  General instructions     Sleep with your head and neck raised (elevated). You can do this by sleeping in a recliner or by putting a few pillows under your head.   Rest as needed. Get at least 8 hours of sleep each night.   Drink enough water to keep your pee (urine) pale yellow.   Eat a healthy diet that includes plenty of vegetables, fruits, whole grains, low-fat dairy products, and lean protein.   Do not use any products that contain nicotine or tobacco. These include cigarettes, e-cigarettes, and chewing tobacco. If you need help quitting, ask your doctor.   Keep all follow-up visits as told by your doctor. This is important.  How is this prevented?  A shot (vaccine) can help prevent pneumonia. Shots are often suggested for:   People   older than 83 years of age.   People older than 83 years of age who:  ? Are having cancer treatment.  ? Have long-term (chronic) lung disease.  ? Have problems with their body's defense system.  You may also prevent pneumonia if you take these actions:   Get the flu (influenza) shot every year.   Go to the dentist as often as told.   Wash your hands often. If you cannot use soap and water, use hand sanitizer.  Contact a doctor if:   You have a fever.   You lose sleep because your cough medicine does not help.  Get help right away if:   You are short of breath and it gets worse.   You have more chest pain.   Your sickness gets worse. This is very serious if:  ? You are an older adult.  ? Your body's defense system is weak.   You  cough up blood.  Summary   Pneumonia is an infection of the lungs.   Most adults can be treated at home. Some will need treatment in a hospital.   Drink enough water to keep your pee pale yellow.   Get at least 8 hours of sleep each night.  This information is not intended to replace advice given to you by your health care provider. Make sure you discuss any questions you have with your health care provider.  Document Released: 09/15/2007 Document Revised: 11/24/2017 Document Reviewed: 11/24/2017  Elsevier Interactive Patient Education  2019 Elsevier Inc.

## 2018-08-29 NOTE — Progress Notes (Signed)
Subjective:    Patient ID: [name]., male    DOB: 02/25/1932, 85 y.o.   MRN: [id number]  HPI  Saw pt at his home for a home visit for [facility] Follow Up. Went to the ER 5/14 with c/o fever, shortness of breath. His WBC and lactic acid were elevated. His chest xray was concerning for pneumonia. He was admitted for sepsis and started on IV Vancomycin and Cefepime. Blood cultures were negative. He was transition to oral Levaquin prior to discharge. 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 Xray was negative for osteomyelitis. Podiatry and vascular surgery were consulted and it was determined that he can continue to follow up on this as an outpatient. He was discharged home on 5/17 with home health nursing and PT. Since discharge, he reports he is no longer running fevers. He is less short of breath at rest, still short of breath with exertion. He has a cough productive of white mucous. He has not had his right arm wound dressing changed since Sunday. He was supposed to see podiatry today but reports he cancelled his appt due to hypoglycemia. His foot dressing was last changed Sunday as well. He reports he is sleeping well in his new hospital bed- he needs me to write an order for this so that his insurance will pay for it. His appetite is good. He denies any issues with bowel or bladder. He has some swelling in his legs but his weight was 187 lbs today. His biggest concern is his low blood sugar. His sugars is usually < 160, but has been as low as 59. They discontinued his Glipizide in the hospital but he is still taking this because he feels like he needs it. He also takes Levemir and Humalog as prescribed. He is not checking his sugars before meals, only first thing in the morning.  Review of Systems      Past Medical History:  Diagnosis Date  . Arthritis   . CAD    a. MI 01/29/1996 tx'd w/ TPA @ [facility]; b. Myoview 06/2005: EF 50%, scar @ apex, mild peri-infarct ischemia   . Cancer ([place])    skin  . Chronic atrial fibrillation    a. since 2006; b. on warfarin  . Chronic diastolic CHF (congestive heart failure) ([place])    a. echo 04/2006: EF lower limits of nl, mod LVH, mild aortic root dilatation, & mild MR, biatrial enlargement; b. echo 04/2013: EF 60%, mod dilated LA, mild MR & TR, mod pulm HTN w/ RV systolic pressure 53, c. echo 04/21/14: EF 55-60%, unable to exclude WMA, severely dilated LA 6.6 cm, nl RVSP, mildly dilated aortic root  . COPD (chronic obstructive pulmonary disease) ([facility])    oxygen prn at home  . CVA 3903,0092   x2  . DM   . Falls   . GERD (gastroesophageal reflux disease)   . History of kidney stones   . HYPERLIPIDEMIA   . HYPERTENSION   . Kidney stone    a. s/p left ureteral stenting 04/24/14  . Left arm weakness    limited movement. S/P fall injury  . Neuropathy of both feet   . Poor balance   . Wears dentures    full upper and lower    Current Outpatient Medications  Medication Sig Dispense Refill  . acetaminophen (TYLENOL) 650 MG CR tablet Take 650 mg by mouth every 8 (eight) hours as needed for pain.     Marland Kitchen  albuterol (PROAIR HFA) 108 (90 Base) MCG/ACT inhaler USE 1 TO 2 INHALATIONS EVERY 4 HOURS AS NEEDED (Patient taking differently: Inhale 1-2 puffs into the lungs every 4 (four) hours as needed for wheezing or shortness of breath. ) 25.5 g 3  . ALPRAZolam (XANAX) 0.25 MG tablet Take 1 tablet (0.25 mg total) by mouth at bedtime as needed for anxiety. 90 tablet 0  . apixaban (ELIQUIS) 2.5 MG TABS tablet Take 1 tablet (2.5 mg total) by mouth 2 (two) times daily. 180 tablet 1  . atorvastatin (LIPITOR) 40 MG tablet TAKE 1 TABLET DAILY (Patient taking differently: Take 40 mg by mouth daily at 6 PM. ) 90 tablet 0  . citalopram (CELEXA) 10 MG tablet TAKE 1 TABLET DAILY 90 tablet 0  . clotrimazole (LOTRIMIN) 1 % cream Apply 1 application topically 2 (two) times daily. 30 g 0  . COLCRYS 0.6 MG tablet TAKE 1 TABLET TWICE A DAY AS NEEDED  (ACUTE GOUT FLARE) USE AS DIRECTED 90 tablet 7  . COMBIGAN 0.2-0.5 % ophthalmic solution Place 1 drop into both eyes daily.    . diclofenac sodium (VOLTAREN) 1 % GEL Apply 4 g topically 4 (four) times daily. 5 Tube 5  . dorzolamide (TRUSOPT) 2 % ophthalmic solution Place 1 drop into both eyes 3 (three) times daily.     Marland Kitchen ezetimibe (ZETIA) 10 MG tablet TAKE 1 TABLET DAILY 90 tablet 0  . finasteride (PROSCAR) 5 MG tablet TAKE 1 TABLET DAILY (Patient taking differently: Take 5 mg by mouth daily. ) 90 tablet 4  . gabapentin (NEURONTIN) 100 MG capsule TAKE 1 CAPSULE THREE TIMES A DAY (Patient taking differently: Take 100 mg by mouth at bedtime. ) 270 capsule 1  . insulin detemir (LEVEMIR) 100 UNIT/ML injection Inject 0.28 mLs (28 Units total) into the skin daily at 10 pm. (Patient taking differently: Inject 42 Units into the skin daily at 10 pm. ) 10 mL 11  . insulin lispro (HUMALOG) 100 UNIT/ML injection Inject 0.04 mLs (4 Units total) into the skin 3 (three) times daily with meals. (Patient taking differently: Inject 10-18 Units into the skin 3 (three) times daily with meals. 18 am, 3 lunch, 10 supper) 10 mL 11  . Insulin Pen Needle (BD PEN NEEDLE NANO U/F) 32G X 4 MM MISC USE THREE TIMES A DAY FOR INSULIN ADMINISTRATION 270 each 4  . Insulin Syringe-Needle U-100 30G X 1/2" 1 ML MISC 1 each by Does not apply route 3 (three) times daily. 200 each 5  . latanoprost (XALATAN) 0.005 % ophthalmic solution Place 1 drop into both eyes at bedtime.     Marland Kitchen levofloxacin (LEVAQUIN) 750 MG tablet Take 1 tablet (750 mg total) by mouth daily for 5 days. 5 tablet 0  . levothyroxine (SYNTHROID, LEVOTHROID) 50 MCG tablet TAKE 1 TABLET DAILY BEFORE BREAKFAST 90 tablet 0  . metolazone (ZAROXOLYN) 5 MG tablet Take 1 tablet (5 mg total) by mouth daily as needed (swelling). 90 tablet 3  . metoprolol succinate (TOPROL-XL) 50 MG 24 hr tablet TAKE 1 TABLET TWICE A DAY 180 tablet 1  . montelukast (SINGULAIR) 10 MG tablet TAKE 1  TABLET AT BEDTIME (Patient taking differently: Take 10 mg by mouth at bedtime. ) 90 tablet 4  . nitroGLYCERIN (NITROSTAT) 0.4 MG SL tablet Place 0.4 mg under the tongue every 5 (five) minutes as needed.      . nystatin-triamcinolone (MYCOLOG II) cream Apply 1 application topically 2 (two) times daily.     Marland Kitchen  potassium chloride SA (K-DUR,KLOR-CON) 20 MEQ tablet Take 4 tablets (80 meq) by mouth twice daily, take an extra 1 tablet (20 meq) on the days you take metolazone (Patient taking differently: Take 80 mEq by mouth 2 (two) times daily. Take 4 tablets (80 meq) by mouth twice daily at breakfast and at lunch, take an extra 1 tablet (20 meq) on the days you take metolazone put tablet in applesauce to aid swallowing) 732 tablet 3  . predniSONE (DELTASONE) 10 MG tablet Take 1 tablet (10 mg total) by mouth daily with breakfast. 90 tablet 1  . SYMBICORT 160-4.5 MCG/ACT inhaler USE 2 INHALATIONS TWICE A DAY (Patient taking differently: Inhale 1 puff into the lungs 2 (two) times daily. ) 30.6 g 3  . tamsulosin (FLOMAX) 0.4 MG CAPS capsule TAKE 1 CAPSULE DAILY 90 capsule 0  . torsemide (DEMADEX) 20 MG tablet Take 40 mg by mouth 2 (two) times daily.    . traMADol (ULTRAM) 50 MG tablet Take 1 tablet (50 mg total) by mouth 2 (two) times daily. 180 tablet 0  . traZODone (DESYREL) 50 MG tablet Take 2 tablets (100 mg total) by mouth at bedtime as needed for sleep. 180 tablet 0  . umeclidinium bromide (INCRUSE ELLIPTA) 62.5 MCG/INH AEPB Inhale 1 puff into the lungs daily.     No current facility-administered medications for this visit.     Allergies  Allergen Reactions  . Contrast Media [Iodinated Diagnostic Agents] Shortness Of Breath  . Morphine And Related Other (See Comments)    Hallucinations   . Niacin And Related Dermatitis  . Other     Other reaction(s): SHORTNESS OF BREATH  . Amlodipine Rash    Family History  Problem Relation Age of Onset  . Heart disease Mother   . Heart disease Maternal  Grandmother   . Diabetes Maternal Grandmother   . Cancer Neg Hx   . Stroke Neg Hx     Social History   Socioeconomic History  . Marital status: Married    Spouse name: Not on file  . Number of children: Not on file  . Years of education: Not on file  . Highest education level: Not on file  Occupational History  . Not on file  Social Needs  . Financial resource strain: Not very hard  . Food insecurity:    Worry: Never true    Inability: Never true  . Transportation needs:    Medical: No    Non-medical: No  Tobacco Use  . Smoking status: Former Smoker    Packs/day: 2.00    Years: 40.00    Pack years: 80.00    Types: Cigarettes    Last attempt to quit: 05/24/1990    Years since quitting: 28.2  . Smokeless tobacco: Former Systems developer    Quit date: 05/24/1990  Substance and Sexual Activity  . Alcohol use: No  . Drug use: No  . Sexual activity: Not Currently  Lifestyle  . Physical activity:    Days per week: Patient refused    Minutes per session: Patient refused  . Stress: Not at all  Relationships  . Social connections:    Talks on phone: Patient refused    Gets together: Patient refused    Attends religious service: Patient refused    Active member of club or organization: Patient refused    Attends meetings of clubs or organizations: Patient refused    Relationship status: Patient refused  . Intimate partner violence:    Fear of current  or ex partner: Patient refused    Emotionally abused: Patient refused    Physically abused: Patient refused    Forced sexual activity: Patient refused  Other Topics Concern  . Not on file  Social History Narrative  . Not on file     Constitutional: Pt reports fatigue. Denies fever, malaise, fatigue, headache or abrupt weight changes.  HEENT: Denies eye pain, eye redness, ear pain, ringing in the ears, wax buildup, runny nose, nasal congestion, bloody nose, or sore throat. Respiratory: Pt reports dyspnea on exertion, cough. Denies  difficulty breathing.   Cardiovascular: Pt reports swelling in legs. Denies chest pain, chest tightness, palpitations.  Gastrointestinal: Denies abdominal pain, bloating, constipation, diarrhea or blood in the stool.  GU: Denies urgency, frequency, pain with urination, burning sensation, blood in urine, odor or discharge. Musculoskeletal: Pt reports difficulty with gait, frequent falls. Denies decrease in range of motion, muscle pain or joint pain and swelling.  Skin: Pt reports right arm wound, wound of left foot.  Neurological: Pt reports difficulty with balance. Denies dizziness, difficulty with memory, difficulty with speech or problems with coordination.    No other specific complaints in a complete review of systems (except as listed in HPI above).  Objective:   Physical Exam   BP 123/70   Temp 97.9 F (36.6 C)   Resp 20   Wt 187 lb (84.8 kg)   SpO2 97%   BMI 30.18 kg/m  Wt Readings from Last 3 Encounters:  08/29/18 187 lb (84.8 kg)  08/27/18 190 lb 4.1 oz (86.3 kg)  08/16/18 184 lb 8 oz (83.7 kg)    General: Appears his stated age, chronically ill appearing. Skin:4 x 4 cm open wound of right forearm, no surrounding cellultis. Cellulitis noted of LLE. 2 cm deep open wound noted of left foot. Neck:  No adenopathy noted. Cardiovascular: Normal rate and rhythm (usually in Afib). S1,S2 noted.  No murmur, rubs or gallops noted. No JVD 1+ pitting BLE edema. No carotid bruits noted. Pulmonary/Chest: Normal effort and positive vesicular breath sounds. No respiratory distress. No wheezes, rales or ronchi noted.  Abdomen: Soft and nontender. Normal bowel sounds. No distention or masses noted.  Musculoskeletal: Gait not visualized.  Neurological: Alert and oriented. Decreased sensation in BLE. Psychiatric: Mood and affect normal. Behavior is normal. Judgment and thought content normal.    BMET    Component Value Date/Time   NA 138 08/27/2018 0452   NA 143 02/21/2015 1108   NA  137 08/05/2014 1839   K 3.5 08/27/2018 0452   K 3.6 08/05/2014 1839   CL 107 08/27/2018 0452   CL 106 08/05/2014 1839   CO2 23 08/27/2018 0452   CO2 23 08/05/2014 1839   GLUCOSE 202 (H) 08/27/2018 0452   GLUCOSE 235 (H) 08/05/2014 1839   BUN 26 (H) 08/27/2018 0452   BUN 26 02/21/2015 1108   BUN 19 08/05/2014 1839   CREATININE 1.08 08/27/2018 0452   CREATININE 1.20 08/05/2014 1839   CALCIUM 8.8 (L) 08/27/2018 0452   CALCIUM 8.6 (L) 08/05/2014 1839   GFRNONAA >60 08/27/2018 0452   GFRNONAA 57 (L) 08/05/2014 1839   GFRAA >60 08/27/2018 0452   GFRAA >60 08/05/2014 1839    Lipid Panel     Component Value Date/Time   CHOL 122 02/16/2018 1126   TRIG 189.0 (H) 02/16/2018 1126   HDL 47.50 02/16/2018 1126   CHOLHDL 3 02/16/2018 1126   VLDL 37.8 02/16/2018 1126   LDLCALC 37 02/16/2018 1126  CBC    Component Value Date/Time   WBC 12.5 (H) 08/27/2018 0452   RBC 4.07 (L) 08/27/2018 0452   HGB 11.8 (L) 08/27/2018 0452   HGB 13.3 08/05/2014 1839   HCT 36.8 (L) 08/27/2018 0452   HCT 40.1 08/05/2014 1839   PLT 224 08/27/2018 0452   PLT 246 08/05/2014 1839   MCV 90.4 08/27/2018 0452   MCV 83 08/05/2014 1839   MCH 29.0 08/27/2018 0452   MCHC 32.1 08/27/2018 0452   RDW 16.5 (H) 08/27/2018 0452   RDW 16.3 (H) 08/05/2014 1839   LYMPHSABS 0.8 08/24/2018 1749   LYMPHSABS 2.3 08/05/2014 1839   MONOABS 1.1 (H) 08/24/2018 1749   MONOABS 2.0 (H) 08/05/2014 1839   EOSABS 0.0 08/24/2018 1749   EOSABS 0.1 08/05/2014 1839   BASOSABS 0.0 08/24/2018 1749   BASOSABS 0.1 08/05/2014 1839    Hgb A1C Lab Results  Component Value Date   HGBA1C 6.5 (H) 04/17/2018          Assessment & Plan:   [facility] Follow Up for CAP, Sepsis, Open Wound Right Forearm, Open Wound Left Foot, DM 2, Cellulitis Left Leg:  Hospital notes, labs and imaging reviewed I changed all of his bandages after cleansing the wounds Will stop Glipizide Advised him to monitor sugars fasting and before all  meals If CGB < 100, do not take Humalog Will order home health nursing for wound care to right forearm Will order home health PT RX for DME Hospital Bed placed He will continue to follow with Dr. Caryl Comes re: Left foot wound- has an appt at 11:00 tomorrow Continue Levaquin as prescribed, resume Keflex once you are done with Levaquin Advised him no to take the Cipro Will follow back up with him once he see's Dr. Francena Hanly  Return precautions discussed Webb Silversmith, NP  All questions answered

## 2018-09-01 ENCOUNTER — Telehealth: Payer: Self-pay | Admitting: Internal Medicine

## 2018-09-01 NOTE — Telephone Encounter (Signed)
Ok for verbal orders as requested 

## 2018-09-01 NOTE — Telephone Encounter (Signed)
[name] with [facility] called. She evaluated him Wednesday. She wants to see him  Twice a week for two weeks and then one time a week for six weeks. Assessment and management of left foot and right arm. He was in the hospital for pneumonia so she will be doing pneumonia management as well.  She can be reached at [phone] if you have any questions or concerns.

## 2018-09-05 ENCOUNTER — Telehealth: Payer: Self-pay

## 2018-09-05 NOTE — Telephone Encounter (Signed)
I spoke to Mahomet with [facility] and she reports that the pt's daughter was insisting that pt have a UA... Estill Bamberg explained to her that pt just was on an Ax and now is on another, that a UA right now would show anything.Marland KitchenMarland KitchenShe reports pt did void 600cc urine but seems to not have much output after.... She reports pt's vitals have been normal... Estill Bamberg is concerned with the increase in Left leg increase in redness, arm looks better and the Doppler that was done on left leg was normal... please advise

## 2018-09-05 NOTE — Telephone Encounter (Signed)
[name] Gender: Male DOB: [birth year] Age: 85 Y 89 M 21 D Return Phone Number: [phone] (Primary), [phone] (Alternate) Address: [address] City/State/Zip: [place] [address] Client [facility] Night - Client Client Site [place] Physician Webb Silversmith - NP Contact Type Call Who Is Calling Patient / Member / Family / Caregiver Call Type Triage / Clinical Caller Name Estill Bamberg with [facility] Relationship To Patient [place] Return Phone Number [phone] (Alternate) Chief Complaint Urination Pain Reason for Call Symptomatic / Request for [place] states the patient has gone to the bathroom with very little output. He may have UTI Translation No No Triage Reason Other Nurse Assessment Nurse: Wynetta Emery, RN, Baker Janus Date/Time Eilene Ghazi Time): 09/04/2018 10:18:56 AM Confirm and document reason for call. If symptomatic, describe symptoms. ---[name] was discharged for PNA - prior to going in to hospital - dx UTI placed on antibiotic while in hospital changed levaquin finished it on Friday; Saturday started keflex for foot- amputation of great toe due to non healing. has cipro in house. Is on Tursomide and use metolazone as needed. [facility] Nurse feels it may be a renal issues Has the patient had close contact with a person known or suspected to have the novel coronavirus illness OR traveled / lives in area with major community spread (including international travel) in the last 14 days from the onset of symptoms? * If Asymptomatic, screen for exposure and travel within the last 14 days. ---Not Applicable Does the patient have any new or worsening symptoms? ---Yes Will a triage be completed? ---No Select reason for no triage. ---Other Please document clinical  information provided and list any resource used. ---[name] Nurse is concerned about him not sure he is having problems with recurrent UTI's -- and started urinary problems two days after completing levaquin Guidelines Guideline Title Affirmed Question Affirmed Notes Nurse Date/Time ([place] Time) Disp. Time Eilene Ghazi Time) Disposition Final User 09/04/2018 10:31:25 AM Clinical Call Yes Wynetta Emery, RN, Juleen [place] NOTE: All timestamps contained within this report are represented as [place] Standard Time. CONFIDENTIALTY NOTICE: This fax transmission is intended only for the addressee. It contains information that is legally privileged, confidential or otherwise protected from use or disclosure. If you are not the intended recipient, you are strictly prohibited from reviewing, disclosing, copying using or disseminating any of this information or taking any action in reliance on or regarding this information. If you have received this fax in error, please notify us immediately by telephone so that we can arrange for its return to [place]. Phone: [phone], Toll-Free: [phone], Fax: [phone] Page: 2 of 2 Call Id: [id number] Comments User: Michele Rockers, RN Date/Time Eilene Ghazi Time): 09/04/2018 10:31:11 AM NOTE: Estill Bamberg states if Rollene Fare wants to speak with her please do so She feels he needs to have a referral to Urology since two days after having levaquin course completed having UTI s/sx again.

## 2018-09-05 NOTE — Telephone Encounter (Signed)
Called home health care nurse, Estill Bamberg, to follow up on this call to after hours yesterday on patient.   I have Left message for her to please call us and give [place] an update on how patient is doing with his current symptoms.  Still any urinary pain and/or decrease output?

## 2018-09-06 DIAGNOSIS — E1140 Type 2 diabetes mellitus with diabetic neuropathy, unspecified: Secondary | ICD-10-CM

## 2018-09-06 DIAGNOSIS — Z9181 History of falling: Secondary | ICD-10-CM

## 2018-09-06 DIAGNOSIS — Z7982 Long term (current) use of aspirin: Secondary | ICD-10-CM

## 2018-09-06 DIAGNOSIS — I2510 Atherosclerotic heart disease of native coronary artery without angina pectoris: Secondary | ICD-10-CM

## 2018-09-06 DIAGNOSIS — I69354 Hemiplegia and hemiparesis following cerebral infarction affecting left non-dominant side: Secondary | ICD-10-CM

## 2018-09-06 DIAGNOSIS — J441 Chronic obstructive pulmonary disease with (acute) exacerbation: Secondary | ICD-10-CM | POA: Diagnosis not present

## 2018-09-06 DIAGNOSIS — S50811D Abrasion of right forearm, subsequent encounter: Secondary | ICD-10-CM

## 2018-09-06 DIAGNOSIS — Z7901 Long term (current) use of anticoagulants: Secondary | ICD-10-CM

## 2018-09-06 DIAGNOSIS — Z7951 Long term (current) use of inhaled steroids: Secondary | ICD-10-CM

## 2018-09-06 DIAGNOSIS — Z9981 Dependence on supplemental oxygen: Secondary | ICD-10-CM

## 2018-09-06 DIAGNOSIS — I130 Hypertensive heart and chronic kidney disease with heart failure and stage 1 through stage 4 chronic kidney disease, or unspecified chronic kidney disease: Secondary | ICD-10-CM

## 2018-09-06 DIAGNOSIS — E1122 Type 2 diabetes mellitus with diabetic chronic kidney disease: Secondary | ICD-10-CM

## 2018-09-06 DIAGNOSIS — L89890 Pressure ulcer of other site, unstageable: Secondary | ICD-10-CM

## 2018-09-06 DIAGNOSIS — Z85828 Personal history of other malignant neoplasm of skin: Secondary | ICD-10-CM

## 2018-09-06 DIAGNOSIS — I69393 Ataxia following cerebral infarction: Secondary | ICD-10-CM

## 2018-09-06 DIAGNOSIS — Z794 Long term (current) use of insulin: Secondary | ICD-10-CM

## 2018-09-06 DIAGNOSIS — N183 Chronic kidney disease, stage 3 (moderate): Secondary | ICD-10-CM

## 2018-09-06 DIAGNOSIS — Z7952 Long term (current) use of systemic steroids: Secondary | ICD-10-CM

## 2018-09-06 DIAGNOSIS — Z87442 Personal history of urinary calculi: Secondary | ICD-10-CM

## 2018-09-06 DIAGNOSIS — I5032 Chronic diastolic (congestive) heart failure: Secondary | ICD-10-CM

## 2018-09-06 DIAGNOSIS — L89896 Pressure-induced deep tissue damage of other site: Secondary | ICD-10-CM

## 2018-09-06 DIAGNOSIS — I890 Lymphedema, not elsewhere classified: Secondary | ICD-10-CM

## 2018-09-06 MED ORDER — SULFAMETHOXAZOLE-TRIMETHOPRIM 800-160 MG PO TABS: 1.0000 | ORAL_TABLET | Freq: Two times a day (BID) | ORAL | 0 refills | Status: DC

## 2018-09-06 NOTE — Telephone Encounter (Signed)
Can they obtain a urinalysis and culture?

## 2018-09-06 NOTE — Addendum Note (Signed)
Addended by: Lurlean Nanny on: 09/06/2018 01:29 PM   Modules accepted: Orders

## 2018-09-06 NOTE — Telephone Encounter (Signed)
Called [facility], lmovm with instructions... Rx sent through e-scribe  let her know that I will call pt to let him know as well  Called pt and he reports no Sx yesterday or today, will call Home health to tell them to hold off on culture and Ax... will call pharmacy to cancel Ax Rx... pt advised if there are any problems or UTI Sx start to let us know

## 2018-09-06 NOTE — Telephone Encounter (Signed)
Ok to obtain urine culture. What abx is he currently on. I think it should just be the Keflex at this point. If that is all he is on, I want to double cover him with Septra DS 1 tab PO BID x 10 days. If sulfa allergy, we can use Doxycycline 100 mg BID x 10 days, and continue Keflex as well.

## 2018-09-07 ENCOUNTER — Telehealth: Payer: Self-pay | Admitting: Internal Medicine

## 2018-09-07 NOTE — Telephone Encounter (Signed)
VO given.

## 2018-09-07 NOTE — Telephone Encounter (Signed)
Holly, called.  She needs an order for home health physical therapy. 1 x1 wk, 2 x 1 wk, 1 x 2 weeks.

## 2018-09-07 NOTE — Telephone Encounter (Signed)
Ok for verbal orders for PT as requested 

## 2018-09-08 ENCOUNTER — Other Ambulatory Visit (INDEPENDENT_AMBULATORY_CARE_PROVIDER_SITE_OTHER): Payer: Self-pay | Admitting: Vascular Surgery

## 2018-09-08 DIAGNOSIS — Z9862 Peripheral vascular angioplasty status: Secondary | ICD-10-CM

## 2018-09-10 ENCOUNTER — Emergency Department: Payer: Medicare Other

## 2018-09-10 ENCOUNTER — Encounter: Payer: Self-pay | Admitting: Intensive Care

## 2018-09-10 ENCOUNTER — Inpatient Hospital Stay
Admission: EM | Admit: 2018-09-10 | Discharge: 2018-09-16 | DRG: 854 | Disposition: A | Discharge status: Home Health | Payer: Medicare Other | Attending: Internal Medicine | Admitting: Internal Medicine

## 2018-09-10 ENCOUNTER — Other Ambulatory Visit: Payer: Self-pay

## 2018-09-10 DIAGNOSIS — E876 Hypokalemia: Secondary | ICD-10-CM | POA: Diagnosis not present

## 2018-09-10 DIAGNOSIS — A419 Sepsis, unspecified organism: Secondary | ICD-10-CM | POA: Diagnosis present

## 2018-09-10 DIAGNOSIS — Z20828 Contact with and (suspected) exposure to other viral communicable diseases: Secondary | ICD-10-CM | POA: Diagnosis present

## 2018-09-10 DIAGNOSIS — N183 Chronic kidney disease, stage 3 (moderate): Secondary | ICD-10-CM | POA: Diagnosis present

## 2018-09-10 DIAGNOSIS — E1165 Type 2 diabetes mellitus with hyperglycemia: Secondary | ICD-10-CM | POA: Diagnosis not present

## 2018-09-10 DIAGNOSIS — Z87891 Personal history of nicotine dependence: Secondary | ICD-10-CM

## 2018-09-10 DIAGNOSIS — S51812A Laceration without foreign body of left forearm, initial encounter: Secondary | ICD-10-CM | POA: Diagnosis present

## 2018-09-10 DIAGNOSIS — L03116 Cellulitis of left lower limb: Secondary | ICD-10-CM | POA: Diagnosis present

## 2018-09-10 DIAGNOSIS — W010XXA Fall on same level from slipping, tripping and stumbling without subsequent striking against object, initial encounter: Secondary | ICD-10-CM | POA: Diagnosis present

## 2018-09-10 DIAGNOSIS — S41111A Laceration without foreign body of right upper arm, initial encounter: Secondary | ICD-10-CM | POA: Diagnosis present

## 2018-09-10 DIAGNOSIS — I5032 Chronic diastolic (congestive) heart failure: Secondary | ICD-10-CM | POA: Diagnosis present

## 2018-09-10 DIAGNOSIS — N179 Acute kidney failure, unspecified: Secondary | ICD-10-CM | POA: Diagnosis present

## 2018-09-10 DIAGNOSIS — I130 Hypertensive heart and chronic kidney disease with heart failure and stage 1 through stage 4 chronic kidney disease, or unspecified chronic kidney disease: Secondary | ICD-10-CM | POA: Diagnosis present

## 2018-09-10 DIAGNOSIS — Z6830 Body mass index (BMI) 30.0-30.9, adult: Secondary | ICD-10-CM

## 2018-09-10 DIAGNOSIS — Z87442 Personal history of urinary calculi: Secondary | ICD-10-CM

## 2018-09-10 DIAGNOSIS — Y92009 Unspecified place in unspecified non-institutional (private) residence as the place of occurrence of the external cause: Secondary | ICD-10-CM

## 2018-09-10 DIAGNOSIS — S51811A Laceration without foreign body of right forearm, initial encounter: Secondary | ICD-10-CM | POA: Diagnosis present

## 2018-09-10 DIAGNOSIS — J441 Chronic obstructive pulmonary disease with (acute) exacerbation: Secondary | ICD-10-CM | POA: Diagnosis not present

## 2018-09-10 DIAGNOSIS — Z79891 Long term (current) use of opiate analgesic: Secondary | ICD-10-CM

## 2018-09-10 DIAGNOSIS — Z91041 Radiographic dye allergy status: Secondary | ICD-10-CM

## 2018-09-10 DIAGNOSIS — Z95820 Peripheral vascular angioplasty status with implants and grafts: Secondary | ICD-10-CM

## 2018-09-10 DIAGNOSIS — Y835 Amputation of limb(s) as the cause of abnormal reaction of the patient, or of later complication, without mention of misadventure at the time of the procedure: Secondary | ICD-10-CM | POA: Diagnosis present

## 2018-09-10 DIAGNOSIS — I272 Pulmonary hypertension, unspecified: Secondary | ICD-10-CM | POA: Diagnosis present

## 2018-09-10 DIAGNOSIS — E039 Hypothyroidism, unspecified: Secondary | ICD-10-CM | POA: Diagnosis present

## 2018-09-10 DIAGNOSIS — Z79890 Hormone replacement therapy (postmenopausal): Secondary | ICD-10-CM

## 2018-09-10 DIAGNOSIS — Z79899 Other long term (current) drug therapy: Secondary | ICD-10-CM

## 2018-09-10 DIAGNOSIS — Z791 Long term (current) use of non-steroidal anti-inflammatories (NSAID): Secondary | ICD-10-CM

## 2018-09-10 DIAGNOSIS — Z794 Long term (current) use of insulin: Secondary | ICD-10-CM

## 2018-09-10 DIAGNOSIS — L8990 Pressure ulcer of unspecified site, unspecified stage: Secondary | ICD-10-CM

## 2018-09-10 DIAGNOSIS — T8781 Dehiscence of amputation stump: Secondary | ICD-10-CM | POA: Diagnosis present

## 2018-09-10 DIAGNOSIS — Z885 Allergy status to narcotic agent status: Secondary | ICD-10-CM

## 2018-09-10 DIAGNOSIS — Z7901 Long term (current) use of anticoagulants: Secondary | ICD-10-CM

## 2018-09-10 DIAGNOSIS — K5900 Constipation, unspecified: Secondary | ICD-10-CM | POA: Diagnosis present

## 2018-09-10 DIAGNOSIS — I701 Atherosclerosis of renal artery: Secondary | ICD-10-CM | POA: Diagnosis present

## 2018-09-10 DIAGNOSIS — E1122 Type 2 diabetes mellitus with diabetic chronic kidney disease: Secondary | ICD-10-CM | POA: Diagnosis present

## 2018-09-10 DIAGNOSIS — L97529 Non-pressure chronic ulcer of other part of left foot with unspecified severity: Secondary | ICD-10-CM | POA: Diagnosis not present

## 2018-09-10 DIAGNOSIS — E1151 Type 2 diabetes mellitus with diabetic peripheral angiopathy without gangrene: Secondary | ICD-10-CM | POA: Diagnosis present

## 2018-09-10 DIAGNOSIS — Z89412 Acquired absence of left great toe: Secondary | ICD-10-CM

## 2018-09-10 DIAGNOSIS — E785 Hyperlipidemia, unspecified: Secondary | ICD-10-CM | POA: Diagnosis present

## 2018-09-10 DIAGNOSIS — L89621 Pressure ulcer of left heel, stage 1: Secondary | ICD-10-CM | POA: Diagnosis present

## 2018-09-10 DIAGNOSIS — I2510 Atherosclerotic heart disease of native coronary artery without angina pectoris: Secondary | ICD-10-CM | POA: Diagnosis present

## 2018-09-10 DIAGNOSIS — Z9981 Dependence on supplemental oxygen: Secondary | ICD-10-CM

## 2018-09-10 DIAGNOSIS — R062 Wheezing: Secondary | ICD-10-CM

## 2018-09-10 DIAGNOSIS — Z9181 History of falling: Secondary | ICD-10-CM

## 2018-09-10 DIAGNOSIS — N390 Urinary tract infection, site not specified: Secondary | ICD-10-CM | POA: Diagnosis present

## 2018-09-10 DIAGNOSIS — E1142 Type 2 diabetes mellitus with diabetic polyneuropathy: Secondary | ICD-10-CM | POA: Diagnosis present

## 2018-09-10 DIAGNOSIS — Z8619 Personal history of other infectious and parasitic diseases: Secondary | ICD-10-CM

## 2018-09-10 DIAGNOSIS — F419 Anxiety disorder, unspecified: Secondary | ICD-10-CM | POA: Diagnosis present

## 2018-09-10 DIAGNOSIS — E669 Obesity, unspecified: Secondary | ICD-10-CM | POA: Diagnosis present

## 2018-09-10 DIAGNOSIS — S0031XA Abrasion of nose, initial encounter: Secondary | ICD-10-CM | POA: Diagnosis present

## 2018-09-10 DIAGNOSIS — I482 Chronic atrial fibrillation, unspecified: Secondary | ICD-10-CM | POA: Diagnosis present

## 2018-09-10 DIAGNOSIS — Z7952 Long term (current) use of systemic steroids: Secondary | ICD-10-CM

## 2018-09-10 DIAGNOSIS — N400 Enlarged prostate without lower urinary tract symptoms: Secondary | ICD-10-CM | POA: Diagnosis present

## 2018-09-10 DIAGNOSIS — I252 Old myocardial infarction: Secondary | ICD-10-CM

## 2018-09-10 DIAGNOSIS — Z955 Presence of coronary angioplasty implant and graft: Secondary | ICD-10-CM

## 2018-09-10 DIAGNOSIS — Z888 Allergy status to other drugs, medicaments and biological substances status: Secondary | ICD-10-CM

## 2018-09-10 DIAGNOSIS — I10 Essential (primary) hypertension: Secondary | ICD-10-CM | POA: Diagnosis not present

## 2018-09-10 DIAGNOSIS — I509 Heart failure, unspecified: Secondary | ICD-10-CM | POA: Diagnosis not present

## 2018-09-10 DIAGNOSIS — Z8673 Personal history of transient ischemic attack (TIA), and cerebral infarction without residual deficits: Secondary | ICD-10-CM

## 2018-09-10 DIAGNOSIS — I872 Venous insufficiency (chronic) (peripheral): Secondary | ICD-10-CM | POA: Diagnosis present

## 2018-09-10 DIAGNOSIS — I081 Rheumatic disorders of both mitral and tricuspid valves: Secondary | ICD-10-CM | POA: Diagnosis present

## 2018-09-10 DIAGNOSIS — Z7951 Long term (current) use of inhaled steroids: Secondary | ICD-10-CM

## 2018-09-10 DIAGNOSIS — I70245 Atherosclerosis of native arteries of left leg with ulceration of other part of foot: Secondary | ICD-10-CM | POA: Diagnosis not present

## 2018-09-10 LAB — GLUCOSE, CAPILLARY
Glucose-Capillary: 136 mg/dL — ABNORMAL HIGH (ref 70–99)
Glucose-Capillary: 206 mg/dL — ABNORMAL HIGH (ref 70–99)

## 2018-09-10 LAB — URINALYSIS, COMPLETE (UACMP) WITH MICROSCOPIC
Bacteria, UA: NONE SEEN
Bilirubin Urine: NEGATIVE
Glucose, UA: NEGATIVE mg/dL
Hgb urine dipstick: NEGATIVE
Ketones, ur: NEGATIVE mg/dL
Nitrite: NEGATIVE
Protein, ur: NEGATIVE mg/dL
Specific Gravity, Urine: 1.009 (ref 1.005–1.030)
WBC, UA: >50 WBC/hpf — ABNORMAL HIGH (ref 0–5)
pH: 5 (ref 5.0–8.0)

## 2018-09-10 LAB — CBC WITH DIFFERENTIAL/PLATELET
Abs Immature Granulocytes: 0.34 10*3/uL — ABNORMAL HIGH (ref 0.00–0.07)
Basophils Absolute: 0.1 10*3/uL (ref 0.0–0.1)
Basophils Relative: 0 %
Eosinophils Absolute: 0.1 10*3/uL (ref 0.0–0.5)
Eosinophils Relative: 1 %
HCT: 40.9 % (ref 39.0–52.0)
Hemoglobin: 13.2 g/dL (ref 13.0–17.0)
Immature Granulocytes: 2 %
Lymphocytes Relative: 21 %
Lymphs Abs: 3.6 10*3/uL (ref 0.7–4.0)
MCH: 29.3 pg (ref 26.0–34.0)
MCHC: 32.3 g/dL (ref 30.0–36.0)
MCV: 90.7 fL (ref 80.0–100.0)
Monocytes Absolute: 1.9 10*3/uL — ABNORMAL HIGH (ref 0.1–1.0)
Monocytes Relative: 11 %
Neutro Abs: 11 10*3/uL — ABNORMAL HIGH (ref 1.7–7.7)
Neutrophils Relative %: 65 %
Platelets: 278 10*3/uL (ref 150–400)
RBC: 4.51 MIL/uL (ref 4.22–5.81)
RDW: 17.2 % — ABNORMAL HIGH (ref 11.5–15.5)
WBC: 17.1 10*3/uL — ABNORMAL HIGH (ref 4.0–10.5)
nRBC: 0 % (ref 0.0–0.2)

## 2018-09-10 LAB — SARS CORONAVIRUS 2 BY RT PCR (HOSPITAL ORDER, PERFORMED IN ~~LOC~~ HOSPITAL LAB): SARS Coronavirus 2: NEGATIVE

## 2018-09-10 LAB — LACTIC ACID, PLASMA
Lactic Acid, Venous: 3.2 mmol/L (ref 0.5–1.9)
Lactic Acid, Venous: 1.7 mmol/L (ref 0.5–1.9)

## 2018-09-10 LAB — BASIC METABOLIC PANEL
Anion gap: 10 (ref 5–15)
BUN: 40 mg/dL — ABNORMAL HIGH (ref 8–23)
CO2: 26 mmol/L (ref 22–32)
Calcium: 8.9 mg/dL (ref 8.9–10.3)
Chloride: 102 mmol/L (ref 98–111)
Creatinine, Ser: 1.82 mg/dL — ABNORMAL HIGH (ref 0.61–1.24)
GFR calc Af Amer: 39 mL/min — ABNORMAL LOW (ref >60)
GFR calc non Af Amer: 33 mL/min — ABNORMAL LOW (ref >60)
Glucose, Bld: 98 mg/dL (ref 70–99)
Potassium: 4.5 mmol/L (ref 3.5–5.1)
Sodium: 138 mmol/L (ref 135–145)

## 2018-09-10 MED ORDER — CITALOPRAM HYDROBROMIDE 20 MG PO TABS
10.0000 mg | ORAL_TABLET | Freq: Every day | ORAL | Status: DC
  Administered 2018-09-11 – 2018-09-16 (×4): 10 mg via ORAL
  Filled 2018-09-10 (×4): qty 1

## 2018-09-10 MED ORDER — SODIUM CHLORIDE 0.9 % IV SOLN: 1.0000 g | INTRAVENOUS | Status: DC

## 2018-09-10 MED ORDER — TAMSULOSIN HCL 0.4 MG PO CAPS
0.4000 mg | ORAL_CAPSULE | Freq: Every day | ORAL | Status: DC
  Administered 2018-09-10 – 2018-09-16 (×5): 0.4 mg via ORAL
  Filled 2018-09-10 (×5): qty 1

## 2018-09-10 MED ORDER — EZETIMIBE 10 MG PO TABS
10.0000 mg | ORAL_TABLET | Freq: Every day | ORAL | Status: DC
  Administered 2018-09-11 – 2018-09-16 (×4): 10 mg via ORAL
  Filled 2018-09-10 (×4): qty 1

## 2018-09-10 MED ORDER — PREDNISONE 10 MG PO TABS
10.0000 mg | ORAL_TABLET | Freq: Every day | ORAL | Status: DC
  Administered 2018-09-11: 10 mg via ORAL
  Filled 2018-09-10: qty 1

## 2018-09-10 MED ORDER — NYSTATIN-TRIAMCINOLONE 100000-0.1 UNIT/GM-% EX CREA
1.0000 "application " | TOPICAL_CREAM | Freq: Two times a day (BID) | CUTANEOUS | Status: DC
  Administered 2018-09-11 – 2018-09-15 (×9): 1 via TOPICAL
  Filled 2018-09-10: qty 15

## 2018-09-10 MED ORDER — INSULIN ASPART 100 UNIT/ML ~~LOC~~ SOLN
0.0000 [IU] | Freq: Every day | SUBCUTANEOUS | Status: DC
  Administered 2018-09-10: 2 [IU] via SUBCUTANEOUS
  Administered 2018-09-11 – 2018-09-13 (×3): 3 [IU] via SUBCUTANEOUS
  Administered 2018-09-14: 5 [IU] via SUBCUTANEOUS
  Administered 2018-09-15: 3 [IU] via SUBCUTANEOUS
  Filled 2018-09-10 (×6): qty 1

## 2018-09-10 MED ORDER — MOMETASONE FURO-FORMOTEROL FUM 200-5 MCG/ACT IN AERO
2.0000 | INHALATION_SPRAY | Freq: Two times a day (BID) | RESPIRATORY_TRACT | Status: DC
  Administered 2018-09-11 – 2018-09-16 (×10): 2 via RESPIRATORY_TRACT
  Filled 2018-09-10 (×2): qty 8.8

## 2018-09-10 MED ORDER — METOPROLOL SUCCINATE ER 50 MG PO TB24
50.0000 mg | ORAL_TABLET | Freq: Two times a day (BID) | ORAL | Status: DC
  Administered 2018-09-11 – 2018-09-14 (×8): 50 mg via ORAL
  Filled 2018-09-10 (×9): qty 1

## 2018-09-10 MED ORDER — LEVOTHYROXINE SODIUM 50 MCG PO TABS
50.0000 ug | ORAL_TABLET | Freq: Every day | ORAL | Status: DC
  Administered 2018-09-11 – 2018-09-16 (×6): 50 ug via ORAL
  Filled 2018-09-10 (×7): qty 1

## 2018-09-10 MED ORDER — ACETAMINOPHEN 325 MG PO TABS
650.0000 mg | ORAL_TABLET | Freq: Three times a day (TID) | ORAL | Status: DC | PRN
  Filled 2018-09-10: qty 2

## 2018-09-10 MED ORDER — DICLOFENAC SODIUM 1 % TD GEL
4.0000 g | Freq: Four times a day (QID) | TRANSDERMAL | Status: DC
  Administered 2018-09-11 – 2018-09-15 (×10): 4 g via TOPICAL
  Filled 2018-09-10 (×2): qty 100

## 2018-09-10 MED ORDER — SODIUM CHLORIDE 0.9 % IV SOLN
2.0000 g | Freq: Once | INTRAVENOUS | Status: AC
  Administered 2018-09-10: 2 g via INTRAVENOUS
  Filled 2018-09-10: qty 20

## 2018-09-10 MED ORDER — MONTELUKAST SODIUM 10 MG PO TABS
10.0000 mg | ORAL_TABLET | Freq: Every day | ORAL | Status: DC
  Administered 2018-09-11 – 2018-09-15 (×5): 10 mg via ORAL
  Filled 2018-09-10 (×6): qty 1

## 2018-09-10 MED ORDER — SODIUM CHLORIDE 0.9 % IV SOLN
INTRAVENOUS | Status: DC
  Administered 2018-09-10 – 2018-09-11 (×4): via INTRAVENOUS

## 2018-09-10 MED ORDER — INSULIN DETEMIR 100 UNIT/ML ~~LOC~~ SOLN
28.0000 [IU] | Freq: Every day | SUBCUTANEOUS | Status: DC
  Administered 2018-09-10: 28 [IU] via SUBCUTANEOUS
  Filled 2018-09-10 (×2): qty 0.28

## 2018-09-10 MED ORDER — UMECLIDINIUM BROMIDE 62.5 MCG/INH IN AEPB
1.0000 | INHALATION_SPRAY | Freq: Every day | RESPIRATORY_TRACT | Status: DC
  Administered 2018-09-11 – 2018-09-16 (×5): 1 via RESPIRATORY_TRACT
  Filled 2018-09-10: qty 7

## 2018-09-10 MED ORDER — VANCOMYCIN HCL 10 G IV SOLR
1750.0000 mg | Freq: Once | INTRAVENOUS | Status: AC
  Administered 2018-09-10: 1750 mg via INTRAVENOUS
  Filled 2018-09-10: qty 1750

## 2018-09-10 MED ORDER — GABAPENTIN 100 MG PO CAPS
100.0000 mg | ORAL_CAPSULE | Freq: Three times a day (TID) | ORAL | Status: DC
  Administered 2018-09-10 – 2018-09-16 (×16): 100 mg via ORAL
  Filled 2018-09-10 (×17): qty 1

## 2018-09-10 MED ORDER — TRAZODONE HCL 100 MG PO TABS
100.0000 mg | ORAL_TABLET | Freq: Every evening | ORAL | Status: DC | PRN
  Filled 2018-09-10: qty 1

## 2018-09-10 MED ORDER — LATANOPROST 0.005 % OP SOLN
1.0000 [drp] | Freq: Every day | OPHTHALMIC | Status: DC
  Administered 2018-09-11 – 2018-09-15 (×6): 1 [drp] via OPHTHALMIC
  Filled 2018-09-10: qty 2.5

## 2018-09-10 MED ORDER — SODIUM CHLORIDE 0.9 % IV SOLN
2.0000 g | Freq: Two times a day (BID) | INTRAVENOUS | Status: DC
  Administered 2018-09-11 – 2018-09-16 (×12): 2 g via INTRAVENOUS
  Filled 2018-09-10 (×14): qty 2

## 2018-09-10 MED ORDER — ATORVASTATIN CALCIUM 20 MG PO TABS
40.0000 mg | ORAL_TABLET | Freq: Every day | ORAL | Status: DC
  Administered 2018-09-10 – 2018-09-16 (×5): 40 mg via ORAL
  Filled 2018-09-10 (×5): qty 2

## 2018-09-10 MED ORDER — ALBUTEROL SULFATE (2.5 MG/3ML) 0.083% IN NEBU
2.5000 mg | INHALATION_SOLUTION | RESPIRATORY_TRACT | Status: DC | PRN
  Administered 2018-09-11 – 2018-09-12 (×2): 2.5 mg via RESPIRATORY_TRACT
  Filled 2018-09-10 (×2): qty 3

## 2018-09-10 MED ORDER — DORZOLAMIDE HCL 2 % OP SOLN
1.0000 [drp] | Freq: Three times a day (TID) | OPHTHALMIC | Status: DC
  Administered 2018-09-10 – 2018-09-16 (×16): 1 [drp] via OPHTHALMIC
  Filled 2018-09-10: qty 10

## 2018-09-10 MED ORDER — ALPRAZOLAM 0.25 MG PO TABS: 0.2500 mg | ORAL_TABLET | Freq: Every evening | ORAL | Status: DC | PRN

## 2018-09-10 MED ORDER — SODIUM CHLORIDE 0.9 % IV BOLUS
500.0000 mL | Freq: Once | INTRAVENOUS | Status: AC
  Administered 2018-09-10: 500 mL via INTRAVENOUS

## 2018-09-10 MED ORDER — TRAMADOL HCL 50 MG PO TABS
50.0000 mg | ORAL_TABLET | Freq: Two times a day (BID) | ORAL | Status: DC
  Administered 2018-09-10 – 2018-09-16 (×10): 50 mg via ORAL
  Filled 2018-09-10 (×10): qty 1

## 2018-09-10 MED ORDER — VANCOMYCIN HCL 10 G IV SOLR
1250.0000 mg | INTRAVENOUS | Status: DC
  Filled 2018-09-10: qty 1250

## 2018-09-10 MED ORDER — APIXABAN 2.5 MG PO TABS
2.5000 mg | ORAL_TABLET | Freq: Two times a day (BID) | ORAL | Status: DC
  Administered 2018-09-10 – 2018-09-11 (×3): 2.5 mg via ORAL
  Filled 2018-09-10 (×3): qty 1

## 2018-09-10 MED ORDER — BRIMONIDINE TARTRATE 0.2 % OP SOLN
1.0000 [drp] | Freq: Every day | OPHTHALMIC | Status: DC
  Administered 2018-09-10 – 2018-09-15 (×6): 1 [drp] via OPHTHALMIC
  Filled 2018-09-10: qty 5

## 2018-09-10 MED ORDER — FINASTERIDE 5 MG PO TABS
5.0000 mg | ORAL_TABLET | Freq: Every day | ORAL | Status: DC
  Administered 2018-09-10 – 2018-09-16 (×5): 5 mg via ORAL
  Filled 2018-09-10 (×5): qty 1

## 2018-09-10 MED ORDER — TIMOLOL MALEATE 0.5 % OP SOLN
1.0000 [drp] | Freq: Every day | OPHTHALMIC | Status: DC
  Administered 2018-09-10 – 2018-09-15 (×6): 1 [drp] via OPHTHALMIC
  Filled 2018-09-10: qty 5

## 2018-09-10 MED ORDER — NITROGLYCERIN 0.4 MG SL SUBL: 0.4000 mg | SUBLINGUAL_TABLET | SUBLINGUAL | Status: DC | PRN

## 2018-09-10 MED ORDER — CLOTRIMAZOLE 1 % EX CREA
1.0000 "application " | TOPICAL_CREAM | Freq: Two times a day (BID) | CUTANEOUS | Status: DC
  Administered 2018-09-11 – 2018-09-15 (×9): 1 via TOPICAL
  Filled 2018-09-10: qty 15

## 2018-09-10 MED ORDER — BRIMONIDINE TARTRATE-TIMOLOL 0.2-0.5 % OP SOLN
1.0000 [drp] | Freq: Every day | OPHTHALMIC | Status: DC
  Filled 2018-09-10: qty 5

## 2018-09-10 MED ORDER — COLCHICINE 0.6 MG PO TABS
0.6000 mg | ORAL_TABLET | Freq: Every day | ORAL | Status: DC
  Administered 2018-09-11 – 2018-09-16 (×4): 0.6 mg via ORAL
  Filled 2018-09-10 (×4): qty 1

## 2018-09-10 MED ORDER — INSULIN ASPART 100 UNIT/ML ~~LOC~~ SOLN
0.0000 [IU] | Freq: Three times a day (TID) | SUBCUTANEOUS | Status: DC
  Administered 2018-09-10: 1 [IU] via SUBCUTANEOUS
  Administered 2018-09-11: 3 [IU] via SUBCUTANEOUS
  Administered 2018-09-11: 2 [IU] via SUBCUTANEOUS
  Administered 2018-09-12: 3 [IU] via SUBCUTANEOUS
  Administered 2018-09-12: 1 [IU] via SUBCUTANEOUS
  Administered 2018-09-13 (×2): 3 [IU] via SUBCUTANEOUS
  Administered 2018-09-13 – 2018-09-14 (×2): 5 [IU] via SUBCUTANEOUS
  Administered 2018-09-14: 7 [IU] via SUBCUTANEOUS
  Administered 2018-09-14: 2 [IU] via SUBCUTANEOUS
  Administered 2018-09-15 (×2): 5 [IU] via SUBCUTANEOUS
  Administered 2018-09-16: 2 [IU] via SUBCUTANEOUS
  Filled 2018-09-10 (×14): qty 1

## 2018-09-10 MED ORDER — VANCOMYCIN HCL IN DEXTROSE 1-5 GM/200ML-% IV SOLN: 1000.0000 mg | Freq: Once | INTRAVENOUS | Status: DC

## 2018-09-10 NOTE — ED Notes (Signed)
First call report att

## 2018-09-10 NOTE — Progress Notes (Signed)
Care Alignment Note  Advanced Directives Documents (Living Will, Power of Attorney) currently in the EHR no advanced directives documents available .  Has the patient discussed their wishes with their family/healthcare power of attorney no.  What does the patient/decision maker understand about their medical condition and the natural course of their disease.  Sepsis secondary to UTI.  Chronic atrial fibrillation.  Chronic diastolic CHF.  Hypothyroidism.  Mechanical fall.  What is the patient/decision maker's biggest fear or concern for the future pain and suffering   What is the most important goal for this patient should their health condition worsen maintenance of function.  Current   Code Status: Full Code  Current code status has been reviewed/updated.  Time spent:20 minutes

## 2018-09-10 NOTE — ED Provider Notes (Signed)
[facility] Emergency Department Provider Note ____________________________________________   First MD Initiated Contact with Patient 09/10/18 1046     (approximate)  I have reviewed the triage vital signs and the nursing notes.   HISTORY  Chief Complaint Fall     HPI [name]. is a 85 y.o. male with PMH as noted below who presents from home for evaluation after fall, acute onset just prior to coming to the hospital, and occurring mechanically when he tripped over his hard soled shoes.  The patient is on Eliquis.  He hit his head but had no LOC.  He has an abrasion to the bridge of his nose.  In addition he sustained skin tears to his arms, and has pain to the right lower anterior rib area.  He denies any weakness, dizziness, or other acute symptoms prior to the fall.  Past Medical History:  Diagnosis Date  . Arthritis   . CAD    a. MI 01/29/1996 tx'd w/ TPA @ [facility]; b. Myoview 06/2005: EF 50%, scar @ apex, mild peri-infarct ischemia  . Cancer ([place])    skin  . Chronic atrial fibrillation    a. since 2006; b. on warfarin  . Chronic diastolic CHF (congestive heart failure) ([place])    a. echo 04/2006: EF lower limits of nl, mod LVH, mild aortic root dilatation, & mild MR, biatrial enlargement; b. echo 04/2013: EF 60%, mod dilated LA, mild MR & TR, mod pulm HTN w/ RV systolic pressure 53, c. echo 04/21/14: EF 55-60%, unable to exclude WMA, severely dilated LA 6.6 cm, nl RVSP, mildly dilated aortic root  . COPD (chronic obstructive pulmonary disease) (HCC)    oxygen prn at home  . CVA 1017,5102   x2  . DM   . Falls   . GERD (gastroesophageal reflux disease)   . History of kidney stones   . HYPERLIPIDEMIA   . HYPERTENSION   . Kidney stone    a. s/p left ureteral stenting 04/24/14  . Left arm weakness    limited movement. S/P fall injury  . Neuropathy of both feet   . Poor balance   . Wears dentures    full upper and lower    Patient Active  Problem List   Diagnosis Date Noted  . HCAP (healthcare-associated pneumonia) 08/24/2018  . Diabetic infection of left foot ([place]) 06/09/2018  . Acquired hypothyroidism 11/22/2016  . Osteoarthritis, knee 11/22/2016  . Diabetes type 2, controlled ([place]) 06/13/2015  . BPH (benign prostatic hyperplasia) 05/14/2014  . Anxiety 05/14/2014  . Chronic obstructive pulmonary disease ([place]) 05/14/2014  . Chronic diastolic CHF (congestive heart failure) ([place])   . Insomnia 08/07/2013  . Hyperlipidemia 02/26/2009  . MITRAL REGURGITATION 02/26/2009  . Essential hypertension 02/26/2009  . MI 02/26/2009  . Coronary artery disease of native artery of native heart with stable angina pectoris ([place]) 02/26/2009  . Chronic atrial fibrillation 02/26/2009  . CVA (cerebral vascular accident) ([place]) 02/26/2009    Past Surgical History:  Procedure Laterality Date  . AMPUTATION TOE Left 06/13/2018   Procedure: 1st Ray Resection Left;  Surgeon: Sharlotte Alamo, DPM;  Location: [facility];  Service: Podiatry;  Laterality: Left;  . BLADDER SURGERY     stent placement   . CARDIAC CATHETERIZATION  1997   [facility]  . CAROTID STENT INSERTION  1997  . CATARACT EXTRACTION W/PHACO Left 10/11/2017   Procedure: CATARACT EXTRACTION PHACO AND INTRAOCULAR LENS PLACEMENT ([place]) COMPLICATED LEFT DIABETIC;  Surgeon: Leandrew Koyanagi, MD;  Location: [facility];  Service: Ophthalmology;  Laterality: Left;  MALYUGIN Diabetic - insulin  . CATARACT EXTRACTION W/PHACO Right 11/30/2017   Procedure: CATARACT EXTRACTION PHACO AND INTRAOCULAR LENS PLACEMENT ([place]) COMPLICATED  RIGHT DIABETIC;  Surgeon: Leandrew Koyanagi, MD;  Location: [facility];  Service: Ophthalmology;  Laterality: Right;  Diabetic - insulin  . CIRCUMCISION  2016  . CORONARY ANGIOPLASTY  1997   s/p stent placement x 2   . CYSTOSCOPY W/ URETERAL STENT REMOVAL Left 10/09/2014   Procedure: CYSTOSCOPY WITH STENT REMOVAL;  Surgeon: Hollice Espy, MD;  Location:  [facility];  Service: Urology;  Laterality: Left;  . CYSTOSCOPY WITH STENT PLACEMENT Left 10/09/2014   Procedure: CYSTOSCOPY WITH STENT PLACEMENT;  Surgeon: Hollice Espy, MD;  Location: [facility];  Service: Urology;  Laterality: Left;  . KIDNEY SURGERY  05/2013   s/p stent placement   . LOWER EXTREMITY ANGIOGRAPHY Left 06/12/2018   Procedure: Lower Extremity Angiography;  Surgeon: Algernon Huxley, MD;  Location: [facility];  Service: Cardiovascular;  Laterality: Left;  . stents ureters Bilateral   . TONSILLECTOMY AND ADENOIDECTOMY  1959  . URETEROSCOPY WITH HOLMIUM LASER LITHOTRIPSY Left 10/09/2014   Procedure: URETEROSCOPY WITH HOLMIUM LASER LITHOTRIPSY;  Surgeon: Hollice Espy, MD;  Location: [facility];  Service: Urology;  Laterality: Left;    Prior to Admission medications   Medication Sig Start Date End Date Taking? Authorizing Provider  acetaminophen (TYLENOL) 650 MG CR tablet Take 650 mg by mouth every 8 (eight) hours as needed for pain.     [provider]  albuterol (PROAIR HFA) 108 (90 Base) MCG/ACT inhaler USE 1 TO 2 INHALATIONS EVERY 4 HOURS AS NEEDED Patient taking differently: Inhale 1-2 puffs into the lungs every 4 (four) hours as needed for wheezing or shortness of breath.  11/09/17   Wilhelmina Mcardle, MD  ALPRAZolam Duanne Moron) 0.25 MG tablet Take 1 tablet (0.25 mg total) by mouth at bedtime as needed for anxiety. 07/19/18   Jearld Fenton, NP  apixaban (ELIQUIS) 2.5 MG TABS tablet Take 1 tablet (2.5 mg total) by mouth 2 (two) times daily. 03/27/18   Minna Merritts, MD  atorvastatin (LIPITOR) 40 MG tablet TAKE 1 TABLET DAILY Patient taking differently: Take 40 mg by mouth daily at 6 PM.  07/17/18   Jearld Fenton, NP  citalopram (CELEXA) 10 MG tablet TAKE 1 TABLET DAILY 07/18/18   Jearld Fenton, NP  clotrimazole (LOTRIMIN) 1 % cream Apply 1 application topically 2 (two) times daily. 11/18/17   Bedsole, Amy E, MD  COLCRYS 0.6 MG tablet TAKE 1 TABLET TWICE A DAY AS NEEDED  (ACUTE GOUT FLARE) USE AS DIRECTED 07/06/18   Baity, Coralie Keens, NP  COMBIGAN 0.2-0.5 % ophthalmic solution Place 1 drop into both eyes daily. 06/26/18   [provider]  diclofenac sodium (VOLTAREN) 1 % GEL Apply 4 g topically 4 (four) times daily. 01/05/18   Copland, Frederico Hamman, MD  dorzolamide (TRUSOPT) 2 % ophthalmic solution Place 1 drop into both eyes 3 (three) times daily.  03/31/16   [provider]  ezetimibe (ZETIA) 10 MG tablet TAKE 1 TABLET DAILY 07/17/18   Jearld Fenton, NP  finasteride (PROSCAR) 5 MG tablet TAKE 1 TABLET DAILY Patient taking differently: Take 5 mg by mouth daily.  06/29/17   Jearld Fenton, NP  gabapentin (NEURONTIN) 100 MG capsule TAKE 1 CAPSULE THREE TIMES A DAY Patient taking differently: Take 100 mg by mouth at bedtime.  06/09/18  Jearld Fenton, NP  insulin detemir (LEVEMIR) 100 UNIT/ML injection Inject 0.28 mLs (28 Units total) into the skin daily at 10 pm. Patient taking differently: Inject 42 Units into the skin daily at 10 pm.  06/15/18   Fritzi Mandes, MD  insulin lispro (HUMALOG) 100 UNIT/ML injection Inject 0.04 mLs (4 Units total) into the skin 3 (three) times daily with meals. Patient taking differently: Inject 10-18 Units into the skin 3 (three) times daily with meals. 18 am, 3 lunch, 10 supper 06/15/18   Fritzi Mandes, MD  Insulin Pen Needle (BD PEN NEEDLE NANO U/F) 32G X 4 MM MISC USE THREE TIMES A DAY FOR INSULIN ADMINISTRATION 02/15/18   Jearld Fenton, NP  Insulin Syringe-Needle U-100 30G X 1/2" 1 ML MISC 1 each by Does not apply route 3 (three) times daily. 07/19/14   Jearld Fenton, NP  latanoprost (XALATAN) 0.005 % ophthalmic solution Place 1 drop into both eyes at bedtime.  04/18/16   [provider]  levothyroxine (SYNTHROID, LEVOTHROID) 50 MCG tablet TAKE 1 TABLET DAILY BEFORE BREAKFAST 06/27/18   Jearld Fenton, NP  metolazone (ZAROXOLYN) 5 MG tablet Take 1 tablet (5 mg total) by mouth daily as needed (swelling). 03/13/18   Minna Merritts, MD  metoprolol succinate (TOPROL-XL) 50 MG 24 hr tablet TAKE 1 TABLET TWICE A DAY 06/09/18   Jearld Fenton, NP  montelukast (SINGULAIR) 10 MG tablet TAKE 1 TABLET AT BEDTIME Patient taking differently: Take 10 mg by mouth at bedtime.  02/24/18   Jearld Fenton, NP  nitroGLYCERIN (NITROSTAT) 0.4 MG SL tablet Place 0.4 mg under the tongue every 5 (five) minutes as needed.      [provider]  nystatin-triamcinolone (MYCOLOG II) cream Apply 1 application topically 2 (two) times daily.  03/12/14   [provider]  potassium chloride SA (K-DUR,KLOR-CON) 20 MEQ tablet Take 4 tablets (80 meq) by mouth twice daily, take an extra 1 tablet (20 meq) on the days you take metolazone Patient taking differently: Take 80 mEq by mouth 2 (two) times daily. Take 4 tablets (80 meq) by mouth twice daily at breakfast and at lunch, take an extra 1 tablet (20 meq) on the days you take metolazone put tablet in applesauce to aid swallowing 02/10/18   Minna Merritts, MD  predniSONE (DELTASONE) 10 MG tablet Take 1 tablet (10 mg total) by mouth daily with breakfast. 05/05/18   Jearld Fenton, NP  sulfamethoxazole-trimethoprim (BACTRIM DS) 800-160 MG tablet Take 1 tablet by mouth 2 (two) times daily. 09/06/18   Jearld Fenton, NP  SYMBICORT 160-4.5 MCG/ACT inhaler USE 2 INHALATIONS TWICE A DAY Patient taking differently: Inhale 1 puff into the lungs 2 (two) times daily.  08/31/17   Wilhelmina Mcardle, MD  tamsulosin (FLOMAX) 0.4 MG CAPS capsule TAKE 1 CAPSULE DAILY 07/18/18   Jearld Fenton, NP  torsemide (DEMADEX) 20 MG tablet Take 40 mg by mouth 2 (two) times daily.    [provider]  traMADol (ULTRAM) 50 MG tablet Take 1 tablet (50 mg total) by mouth 2 (two) times daily. 06/29/18   Jearld Fenton, NP  traZODone (DESYREL) 50 MG tablet Take 2 tablets (100 mg total) by mouth at bedtime as needed for sleep. 02/16/18   Jearld Fenton, NP  umeclidinium bromide (INCRUSE ELLIPTA) 62.5 MCG/INH  AEPB Inhale 1 puff into the lungs daily.    [provider]    Allergies Contrast media [iodinated diagnostic agents];  Morphine and related; Niacin and related; Other; and Amlodipine  Family History  Problem Relation Age of Onset  . Heart disease Mother   . Heart disease Maternal Grandmother   . Diabetes Maternal Grandmother   . Cancer Neg Hx   . Stroke Neg Hx     Social History Social History   Tobacco Use  . Smoking status: Former Smoker    Packs/day: 2.00    Years: 40.00    Pack years: 80.00    Types: Cigarettes    Last attempt to quit: 05/24/1990    Years since quitting: 28.3  . Smokeless tobacco: Former Systems developer    Quit date: 05/24/1990  Substance Use Topics  . Alcohol use: No  . Drug use: No    Review of Systems  Constitutional: No fever. Eyes: No redness. ENT: No neck pain. Cardiovascular: Denies chest pain. Respiratory: Denies shortness of breath. Gastrointestinal: No vomiting or diarrhea. Genitourinary: Negative for flank pain.  Musculoskeletal: Negative for back pain. Skin: Negative for rash. Neurological: Negative for headaches, focal weakness or numbness.   ____________________________________________   PHYSICAL EXAM:  VITAL SIGNS: ED Triage Vitals  Enc Vitals Group     BP 09/10/18 1034 121/73     Pulse Rate 09/10/18 1034 80     Resp 09/10/18 1034 18     Temp 09/10/18 1036 97.8 F (36.6 C)     Temp Source 09/10/18 1036 Oral     SpO2 09/10/18 1034 96 %     Weight 09/10/18 1034 187 lb (84.8 kg)     Height 09/10/18 1034 5\' 6"  (1.676 m)     Head Circumference --      Peak Flow --      Pain Score 09/10/18 1034 2     Pain Loc --      Pain Edu? --      Excl. in [place]? --     Constitutional: Alert and oriented.  Relatively comfortable appearing and in no acute distress. Eyes: Conjunctivae are normal.  EOMI. Head: 1 cm abrasion to the bridge of the nose.  Otherwise atraumatic. Nose: No congestion/rhinnorhea. Mouth/Throat: Mucous membranes  are moist.   Neck: Normal range of motion.  No midline cervical spinal tenderness. Cardiovascular: Normal rate, regular rhythm. Good peripheral circulation. Respiratory: Normal respiratory effort.  No retractions.  Right anterior lower rib border with mild tenderness. Gastrointestinal: Soft and nontender. No distention.  Genitourinary: No flank tenderness. Musculoskeletal: No lower extremity edema.  Extremities warm and well perfused.  No midline cervical spinal tenderness.  Full range of motion at bilateral shoulders, elbows, hips, and knees.  Erythema to left lower leg which patient states is chronic.  No abnormal warmth, induration, or tenderness. Neurologic:  Normal speech and language. No gross focal neurologic deficits are appreciated.  Skin:  Skin is warm and dry. No rash noted.  Right forearm with subacute appearing skin tear, healing well.  Right upper arm with large superficial skin tear.  Left forearm with several 2 to 3 cm superficial skin tears. Psychiatric: Mood and affect are normal. Speech and behavior are normal.  ____________________________________________   LABS (all labs ordered are listed, but only abnormal results are displayed)  Labs Reviewed  BASIC METABOLIC PANEL - Abnormal; Notable for the following components:      Result Value   BUN 40 (*)    Creatinine, Ser 1.82 (*)    GFR calc non Af Amer 33 (*)    GFR calc Af Amer 39 (*)  All other components within normal limits  CBC WITH DIFFERENTIAL/PLATELET - Abnormal; Notable for the following components:   WBC 17.1 (*)    RDW 17.2 (*)    Neutro Abs 11.0 (*)    Monocytes Absolute 1.9 (*)    Abs Immature Granulocytes 0.34 (*)    All other components within normal limits  URINALYSIS, COMPLETE (UACMP) WITH MICROSCOPIC - Abnormal; Notable for the following components:   Color, Urine YELLOW (*)    APPearance HAZY (*)    Leukocytes,Ua LARGE (*)    WBC, UA >50 (*)    All other components within normal limits   LACTIC ACID, PLASMA - Abnormal; Notable for the following components:   Lactic Acid, Venous 3.2 (*)    All other components within normal limits  SARS CORONAVIRUS 2 (HOSPITAL ORDER, [place])  LACTIC ACID, PLASMA   ____________________________________________  EKG  ED ECG REPORT I, Arta Silence, the attending physician, personally viewed and interpreted this ECG.  Date: 09/10/2018 EKG Time: 1036 Rate: 71 Rhythm: Atrial fibrillation QRS Axis: normal Intervals: normal ST/T Wave abnormalities: Nonspecific repolarization abnormality Narrative Interpretation: Nonspecific abnormalities with no evidence of acute ischemia; no significant change when compared to EKG of 08/24/2018  ____________________________________________  RADIOLOGY  CT head: No ICH CT maxillofacial: No acute fracture CT cervical spine: No acute fracture XR R ribs: No acute fracture.  Chest x-ray negative.   ____________________________________________   PROCEDURES  Procedure(s) performed: No  Procedures  Critical Care performed: No ____________________________________________   INITIAL IMPRESSION / ASSESSMENT AND PLAN / ED COURSE  Pertinent labs & imaging results that were available during my care of the patient were reviewed by me and considered in my medical decision making (see chart for details).  85 year old male with PMH as noted above presents after mechanical fall from standing height.  The patient hit his head but had no LOC.  He has pain to his right lower rib area, and skin tears to both arms.  I reviewed the past medical records in [place].  The patient was admitted for pneumonia and sepsis earlier this month.  He states he has been recovering well.  On exam the patient is relatively comfortable appearing.  His vital signs are normal.  Neurologic exam is nonfocal.  He has skin tears to bilateral arms as noted above as well as a subacute appearing skin tear  to the right forearm which has no signs of infection.  He has an abrasion to the bridge of his nose.  There is erythema to the left lower extremity and patient states that he has had both cellulitis and gout there before, but he states that the erythema is unchanged and he has no acute pain or other symptoms.  In terms of the fall we will obtain CT head, maxillofacial, and C-spine due to the patient's age and the fact that he is on Eliquis.  In addition I will order an x-ray of the ribs on the right side.  We will obtain basic labs.  The patient reports that his wife has Parkinson's disease and although there is a visiting nurse a few times per week for wound care, he states that his wife is unable to take care of him adequately.  The patient will likely need social work consultation for possible additional home services or placement if the work-up is negative.  ----------------------------------------- 2:58 PM on 09/10/2018 -----------------------------------------  The imaging was negative for findings of acute injury.  Lab work-up was significant  for elevated WBC count of 17, so I added on a UA and a lactic acid.  UA shows findings concerning for possible infection.  The lactic acid is elevated.  I suspect that the other possible source could be cellulitis to the left lower extremity.  I have ordered vancomycin and ceftriaxone to cover for both of these etiologies.  The patient will require admission.  I signed him out to the hospitalist.  ________________________  [name]. was evaluated in Emergency Department on 09/10/2018 for the symptoms described in the history of present illness. He was evaluated in the context of the global COVID-19 pandemic, which necessitated consideration that the patient might be at risk for infection with the SARS-CoV-2 virus that causes COVID-19. Institutional protocols and algorithms that pertain to the evaluation of patients at risk for COVID-19 are in a  state of rapid change based on information released by regulatory bodies including the CDC and federal and state organizations. These policies and algorithms were followed during the patient's care in the ED.  ____________________________________________   FINAL CLINICAL IMPRESSION(S) / ED DIAGNOSES  Final diagnoses:  Urinary tract infection without hematuria, site unspecified  Sepsis without acute organ dysfunction, due to unspecified organism [facility])      NEW MEDICATIONS STARTED DURING THIS VISIT:  New Prescriptions   No medications on file     Note:  This document was prepared using Dragon voice recognition software and may include unintentional dictation errors.    Arta Silence, MD 09/10/18 1459

## 2018-09-10 NOTE — ED Notes (Signed)
ED TO INPATIENT HANDOFF REPORT  ED Nurse Name and Phone #: Zeeshan Korte [phone]  S Name/Age/Gender [name]. 85 y.o. male Room/Bed: ED33A/ED33A  Code Status   Code Status: Full Code  Home/SNF/Other Home Patient oriented to: self, place, time and situation Is this baseline? Yes   Triage Complete: Triage complete  Chief Complaint Fall  Triage Note Mechanical fall from  Home. Arrived by EMS. Skin tear present on right upper arm and Left lower arm. Takes eliquis daily. Patient did hit head, Denies LOC. Blood sugar 98 from EMS. A&O x4 upon arrival. Baseline uses walker   Allergies Allergies  Allergen Reactions  . Contrast Media [Iodinated Diagnostic Agents] Shortness Of Breath  . Morphine And Related Other (See Comments)    Hallucinations   . Niacin And Related Dermatitis  . Other     Other reaction(s): SHORTNESS OF BREATH  . Amlodipine Rash    Level of Care/Admitting Diagnosis ED Disposition    ED Disposition Condition [facility] Area: [place] [[id number]]  Level of Care: Med-Surg [16]  Covid Evaluation: Confirmed COVID Negative  Diagnosis: Sepsis [facility]) [[id number]]  Admitting Physician: Otila Back [place]  Attending Physician: Otila Back [[id number]]  Estimated length of stay: past midnight tomorrow  Certification:: I certify this patient will need inpatient services for at least 2 midnights  PT Class (Do Not Modify): Inpatient [101]  PT Acc Code (Do Not Modify): Private [1]       B Medical/Surgery History Past Medical History:  Diagnosis Date  . Arthritis   . CAD    a. MI 01/29/1996 tx'd w/ TPA @ [facility]; b. Myoview 06/2005: EF 50%, scar @ apex, mild peri-infarct ischemia  . Cancer ([place])    skin  . Chronic atrial fibrillation    a. since 2006; b. on warfarin  . Chronic diastolic CHF (congestive heart failure) ([place])    a. echo 04/2006: EF lower limits of nl, mod LVH, mild aortic root dilatation, & mild MR, biatrial enlargement; b.  echo 04/2013: EF 60%, mod dilated LA, mild MR & TR, mod pulm HTN w/ RV systolic pressure 53, c. echo 04/21/14: EF 55-60%, unable to exclude WMA, severely dilated LA 6.6 cm, nl RVSP, mildly dilated aortic root  . COPD (chronic obstructive pulmonary disease) (H[facility])    oxygen prn at home  . CVA 2706,2376   x2  . DM   . Falls   . GERD (gastroesophageal reflux disease)   . History of kidney stones   . HYPERLIPIDEMIA   . HYPERTENSION   . Kidney stone    a. s/p left ureteral stenting 04/24/14  . Left arm weakness    limited movement. S/P fall injury  . Neuropathy of both feet   . Poor balance   . Wears dentures    full upper and lower   Past Surgical History:  Procedure Laterality Date  . AMPUTATION TOE Left 06/13/2018   Procedure: 1st Ray Resection Left;  Surgeon: Sharlotte Alamo, DPM;  Location: [facility];  Service: Podiatry;  Laterality: Left;  . BLADDER SURGERY     stent placement   . CARDIAC CATHETERIZATION  1997   [facility]  . CAROTID STENT INSERTION  1997  . CATARACT EXTRACTION W/PHACO Left 10/11/2017   Procedure: CATARACT EXTRACTION PHACO AND INTRAOCULAR LENS PLACEMENT ([place]) COMPLICATED LEFT DIABETIC;  Surgeon: Leandrew Koyanagi, MD;  Location: [facility];  Service: Ophthalmology;  Laterality: Left;  MALYUGIN Diabetic - insulin  . CATARACT EXTRACTION  W/PHACO Right 11/30/2017   Procedure: CATARACT EXTRACTION PHACO AND INTRAOCULAR LENS PLACEMENT ([place]) COMPLICATED  RIGHT DIABETIC;  Surgeon: Leandrew Koyanagi, MD;  Location: [facility];  Service: Ophthalmology;  Laterality: Right;  Diabetic - insulin  . CIRCUMCISION  2016  . CORONARY ANGIOPLASTY  1997   s/p stent placement x 2   . CYSTOSCOPY W/ URETERAL STENT REMOVAL Left 10/09/2014   Procedure: CYSTOSCOPY WITH STENT REMOVAL;  Surgeon: Hollice Espy, MD;  Location: [facility];  Service: Urology;  Laterality: Left;  . CYSTOSCOPY WITH STENT PLACEMENT Left 10/09/2014   Procedure: CYSTOSCOPY WITH STENT PLACEMENT;  Surgeon: Hollice Espy, MD;  Location: [facility];  Service: Urology;  Laterality: Left;  . KIDNEY SURGERY  05/2013   s/p stent placement   . LOWER EXTREMITY ANGIOGRAPHY Left 06/12/2018   Procedure: Lower Extremity Angiography;  Surgeon: Algernon Huxley, MD;  Location: [facility];  Service: Cardiovascular;  Laterality: Left;  . stents ureters Bilateral   . TONSILLECTOMY AND ADENOIDECTOMY  1959  . URETEROSCOPY WITH HOLMIUM LASER LITHOTRIPSY Left 10/09/2014   Procedure: URETEROSCOPY WITH HOLMIUM LASER LITHOTRIPSY;  Surgeon: Hollice Espy, MD;  Location: [facility];  Service: Urology;  Laterality: Left;     A IV Location/Drains/Wounds Patient Lines/Drains/Airways Status   Active Line/Drains/Airways    Name:   Placement date:   Placement time:   Site:   Days:   Peripheral IV 09/10/18 Left Hand   09/10/18    1039    Hand   less than 1   Peripheral IV 09/10/18 Right Antecubital   09/10/18    1542    Antecubital   less than 1   Incision (Closed) 06/13/18 Foot Left   06/13/18    1510     89   Wound / Incision (Open or Dehisced) 04/17/18 Venous stasis ulcer Leg Left;Lower weeping wound, described by patient as "venous ulcer with 3 open spots"   04/17/18    1800    Leg   146   Wound / Incision (Open or Dehisced) 08/25/18 Arm Lower;Posterior;Right   08/25/18    1020    Arm   16          Intake/Output Last 24 hours  Intake/Output Summary (Last 24 hours) at 09/10/2018 2121 Last data filed at 09/10/2018 2009 Gross per 24 hour  Intake 1100 ml  Output 1165 ml  Net -65 ml    Labs/Imaging Results for orders placed or performed during the hospital encounter of 09/10/18 (from the past 48 hour(s))  Basic metabolic panel     Status: Abnormal   Collection Time: 09/10/18 10:41 AM  Result Value Ref Range   Sodium 138 135 - 145 mmol/L   Potassium 4.5 3.5 - 5.1 mmol/L   Chloride 102 98 - 111 mmol/L   CO2 26 22 - 32 mmol/L   Glucose, Bld 98 70 - 99 mg/dL   BUN 40 (H) 8 - 23 mg/dL   Creatinine, Ser 1.82 (H) 0.61 -  1.24 mg/dL   Calcium 8.9 8.9 - 10.3 mg/dL   GFR calc non Af Amer 33 (L) >60 mL/min   GFR calc Af Amer 39 (L) >60 mL/min   Anion gap 10 5 - 15    Comment: Performed at [facility], [address] [place], [place] [address]  CBC with Differential     Status: Abnormal   Collection Time: 09/10/18 10:41 AM  Result Value Ref Range   WBC 17.1 (H) 4.0 - 10.5  K/uL   RBC 4.51 4.22 - 5.81 MIL/uL   Hemoglobin 13.2 13.0 - 17.0 g/dL   HCT 40.9 39.0 - 52.0 %   MCV 90.7 80.0 - 100.0 fL   MCH 29.3 26.0 - 34.0 pg   MCHC 32.3 30.0 - 36.0 g/dL   RDW 17.2 (H) 11.5 - 15.5 %   Platelets 278 150 - 400 K/uL   nRBC 0.0 0.0 - 0.2 %   Neutrophils Relative % 65 %   Neutro Abs 11.0 (H) 1.7 - 7.7 K/uL   Lymphocytes Relative 21 %   Lymphs Abs 3.6 0.7 - 4.0 K/uL   Monocytes Relative 11 %   Monocytes Absolute 1.9 (H) 0.1 - 1.0 K/uL   Eosinophils Relative 1 %   Eosinophils Absolute 0.1 0.0 - 0.5 K/uL   Basophils Relative 0 %   Basophils Absolute 0.1 0.0 - 0.1 K/uL   Immature Granulocytes 2 %   Abs Immature Granulocytes 0.34 (H) 0.00 - 0.07 K/uL    Comment: Performed at [facility], [address] [place], [place] [address]  Urinalysis, Complete w Microscopic     Status: Abnormal   Collection Time: 09/10/18 11:12 AM  Result Value Ref Range   Color, Urine YELLOW (A) YELLOW   APPearance HAZY (A) CLEAR   Specific Gravity, Urine 1.009 1.005 - 1.030   pH 5.0 5.0 - 8.0   Glucose, UA NEGATIVE NEGATIVE mg/dL   Hgb urine dipstick NEGATIVE NEGATIVE   Bilirubin Urine NEGATIVE NEGATIVE   Ketones, ur NEGATIVE NEGATIVE mg/dL   Protein, ur NEGATIVE NEGATIVE mg/dL   Nitrite NEGATIVE NEGATIVE   Leukocytes,Ua LARGE (A) NEGATIVE   RBC / HPF 0-5 0 - 5 RBC/hpf   WBC, UA >50 (H) 0 - 5 WBC/hpf   Bacteria, UA NONE SEEN NONE SEEN   Squamous Epithelial / LPF 0-5 0 - 5   Hyaline Casts, UA PRESENT     Comment: Performed at [facility], [address] [place], [place] [address]  Lactic acid,  plasma     Status: Abnormal   Collection Time: 09/10/18 12:13 PM  Result Value Ref Range   Lactic Acid, Venous 3.2 (HH) 0.5 - 1.9 mmol/L    Comment: CRITICAL RESULT CALLED TO, READ BACK BY AND VERIFIED WITH AMBER PAYNE AT 1240 09/10/2018.PMF Performed at [facility], [address] [place], [place] [address]   SARS Coronavirus 2 (CEPHEID - Performed in [facility] hospital lab), Hosp Order     Status: None   Collection Time: 09/10/18 12:13 PM  Result Value Ref Range   SARS Coronavirus 2 NEGATIVE NEGATIVE    Comment: (NOTE) If result is NEGATIVE SARS-CoV-2 target nucleic acids are NOT DETECTED. The SARS-CoV-2 RNA is generally detectable in upper and lower  respiratory specimens during the acute phase of infection. The lowest  concentration of SARS-CoV-2 viral copies this assay can detect is 250  copies / mL. A negative result does not preclude SARS-CoV-2 infection  and should not be used as the sole basis for treatment or other  patient management decisions.  A negative result may occur with  improper specimen collection / handling, submission of specimen other  than nasopharyngeal swab, presence of viral mutation(s) within the  areas targeted by this assay, and inadequate number of viral copies  (<250 copies / mL). A negative result must be combined with clinical  observations, patient history, and epidemiological information. If result is POSITIVE SARS-CoV-2 target nucleic acids are DETECTED. The SARS-CoV-2 RNA is  generally detectable in upper and lower  respiratory specimens dur ing the acute phase of infection.  Positive  results are indicative of active infection with SARS-CoV-2.  Clinical  correlation with patient history and other diagnostic information is  necessary to determine patient infection status.  Positive results do  not rule out bacterial infection or co-infection with other viruses. If result is PRESUMPTIVE POSTIVE SARS-CoV-2 nucleic acids MAY BE  PRESENT.   A presumptive positive result was obtained on the submitted specimen  and confirmed on repeat testing.  While 2019 novel coronavirus  (SARS-CoV-2) nucleic acids may be present in the submitted sample  additional confirmatory testing may be necessary for epidemiological  and / or clinical management purposes  to differentiate between  SARS-CoV-2 and other Sarbecovirus currently known to infect humans.  If clinically indicated additional testing with an alternate test  methodology [phone]) is advised. The SARS-CoV-2 RNA is generally  detectable in upper and lower respiratory sp ecimens during the acute  phase of infection. The expected result is Negative. Fact Sheet for Patients:  StrictlyIdeas.no Fact Sheet for Healthcare Providers: BankingDealers.co.za This test is not yet approved or cleared by the [place] FDA and has been authorized for detection and/or diagnosis of SARS-CoV-2 by FDA under an Emergency Use Authorization (EUA).  This EUA will remain in effect (meaning this test can be used) for the duration of the COVID-19 declaration under Section 564(b)(1) of the Act, 21 U.S.C. section 360bbb-3(b)(1), unless the authorization is terminated or revoked sooner. Performed at [facility], [place], [place] [address]   Lactic acid, plasma     Status: None   Collection Time: 09/10/18  3:42 PM  Result Value Ref Range   Lactic Acid, Venous 1.7 0.5 - 1.9 mmol/L    Comment: Performed at [facility], [place], [place] [address]  Glucose, capillary     Status: Abnormal   Collection Time: 09/10/18  4:15 PM  Result Value Ref Range   Glucose-Capillary 136 (H) 70 - 99 mg/dL   Comment 1 Notify RN    Comment 2 Document in Chart    Dg Ribs Unilateral W/chest Right  Result Date: 09/10/2018 CLINICAL DATA:  Right anterior lower rib pain after fall EXAM: RIGHT RIBS AND CHEST - 3+  VIEW COMPARISON:  08/24/2018 chest radiograph. FINDINGS: Stable cardiomediastinal silhouette with mild cardiomegaly. No pneumothorax. No pleural effusion. Diffuse prominence of the central interstitial markings, slightly decreased from prior, favor mild pulmonary edema. No fracture or suspicious focal osseous lesion seen in the right ribs. IMPRESSION: 1. Cardiomegaly with diffuse prominence of the central interstitial markings, favor mild pulmonary edema, slightly improved from prior. 2. No right rib fracture detected. Should the patient's symptoms persist or worsen, repeat radiographs of the ribs in 10 - 14 days maybe of use to detect subtle nondisplaced rib fractures (which are commonly occult on initial imaging). Electronically Signed   By: Ilona Sorrel M.D.   On: 09/10/2018 12:06   Ct Head Wo Contrast  Result Date: 09/10/2018 CLINICAL DATA:  Fall EXAM: CT HEAD WITHOUT CONTRAST CT MAXILLOFACIAL WITHOUT CONTRAST CT CERVICAL SPINE WITHOUT CONTRAST TECHNIQUE: Multidetector CT imaging of the head, cervical spine, and maxillofacial structures were performed using the standard protocol without intravenous contrast. Multiplanar CT image reconstructions of the cervical spine and maxillofacial structures were also generated. COMPARISON:  CT head dated 08/11/2018 FINDINGS: Motion degraded images. CT HEAD FINDINGS Brain: No evidence of acute infarction, hemorrhage, hydrocephalus, extra-axial collection or  mass lesion/mass effect. Encephalomalacic changes in the right occipital lobe, chronic. Subcortical white matter and periventricular small vessel ischemic changes. Vascular: Intracranial atherosclerosis. Skull: Normal. Negative for fracture or focal lesion. Other: None. CT MAXILLOFACIAL FINDINGS Osseous: No evidence of maxillofacial fracture. The mandible is intact. The bilateral mandibular condyles are well-seated in the TMJs. Orbits: The bilateral orbits, including the globes and retroconal soft tissues, are within  normal limits. Sinuses: The visualized paranasal sinuses are essentially clear. The mastoid air cells are unopacified. Soft tissues: Negative. CT CERVICAL SPINE FINDINGS Alignment: Straightening of the normal upper cervical lordosis. Skull base and vertebrae: No acute fracture. No primary bone lesion or focal pathologic process. Soft tissues and spinal canal: No prevertebral fluid or swelling. No visible canal hematoma. Disc levels: Mild to moderate degenerative changes of the mid/lower cervical spine. Spinal canal is patent. Upper chest: Visualized thyroid is grossly unremarkable. Other: None. IMPRESSION: Motion degraded images. No evidence of acute intracranial abnormality. 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 Small vessel ischemic changes. No evidence of maxillofacial fracture. No evidence of traumatic injury to the cervical spine. Mild to moderate degenerative changes. Electronically Signed   By: Julian Hy M.D.   On: 09/10/2018 11:52   Ct Cervical Spine Wo Contrast  Result Date: 09/10/2018 CLINICAL DATA:  Fall EXAM: CT HEAD WITHOUT CONTRAST CT MAXILLOFACIAL WITHOUT CONTRAST CT CERVICAL SPINE WITHOUT CONTRAST TECHNIQUE: Multidetector CT imaging of the head, cervical spine, and maxillofacial structures were performed using the standard protocol without intravenous contrast. Multiplanar CT image reconstructions of the cervical spine and maxillofacial structures were also generated. COMPARISON:  CT head dated 08/11/2018 FINDINGS: Motion degraded images. CT HEAD FINDINGS Brain: No evidence of acute infarction, hemorrhage, hydrocephalus, extra-axial collection or mass lesion/mass effect. Encephalomalacic changes in the right occipital lobe, chronic. Subcortical white matter and periventricular small vessel ischemic changes. Vascular: Intracranial atherosclerosis. Skull: Normal. Negative for fracture or focal lesion. Other: None. CT MAXILLOFACIAL FINDINGS Osseous: No evidence of maxillofacial fracture. The  mandible is intact. The bilateral mandibular condyles are well-seated in the TMJs. Orbits: The bilateral orbits, including the globes and retroconal soft tissues, are within normal limits. Sinuses: The visualized paranasal sinuses are essentially clear. The mastoid air cells are unopacified. Soft tissues: Negative. CT CERVICAL SPINE FINDINGS Alignment: Straightening of the normal upper cervical lordosis. Skull base and vertebrae: No acute fracture. No primary bone lesion or focal pathologic process. Soft tissues and spinal canal: No prevertebral fluid or swelling. No visible canal hematoma. Disc levels: Mild to moderate degenerative changes of the mid/lower cervical spine. Spinal canal is patent. Upper chest: Visualized thyroid is grossly unremarkable. Other: None. IMPRESSION: Motion degraded images. No evidence of acute intracranial abnormality. 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 Small vessel ischemic changes. No evidence of maxillofacial fracture. No evidence of traumatic injury to the cervical spine. Mild to moderate degenerative changes. Electronically Signed   By: Julian Hy M.D.   On: 09/10/2018 11:52   Ct Maxillofacial Wo Contrast  Result Date: 09/10/2018 CLINICAL DATA:  Fall EXAM: CT HEAD WITHOUT CONTRAST CT MAXILLOFACIAL WITHOUT CONTRAST CT CERVICAL SPINE WITHOUT CONTRAST TECHNIQUE: Multidetector CT imaging of the head, cervical spine, and maxillofacial structures were performed using the standard protocol without intravenous contrast. Multiplanar CT image reconstructions of the cervical spine and maxillofacial structures were also generated. COMPARISON:  CT head dated 08/11/2018 FINDINGS: Motion degraded images. CT HEAD FINDINGS Brain: No evidence of acute infarction, hemorrhage, hydrocephalus, extra-axial collection or mass lesion/mass effect. Encephalomalacic changes in the right occipital lobe, chronic. Subcortical white  matter and periventricular small vessel ischemic changes. Vascular:  Intracranial atherosclerosis. Skull: Normal. Negative for fracture or focal lesion. Other: None. CT MAXILLOFACIAL FINDINGS Osseous: No evidence of maxillofacial fracture. The mandible is intact. The bilateral mandibular condyles are well-seated in the TMJs. Orbits: The bilateral orbits, including the globes and retroconal soft tissues, are within normal limits. Sinuses: The visualized paranasal sinuses are essentially clear. The mastoid air cells are unopacified. Soft tissues: Negative. CT CERVICAL SPINE FINDINGS Alignment: Straightening of the normal upper cervical lordosis. Skull base and vertebrae: No acute fracture. No primary bone lesion or focal pathologic process. Soft tissues and spinal canal: No prevertebral fluid or swelling. No visible canal hematoma. Disc levels: Mild to moderate degenerative changes of the mid/lower cervical spine. Spinal canal is patent. Upper chest: Visualized thyroid is grossly unremarkable. Other: None. IMPRESSION: Motion degraded images. No evidence of acute intracranial abnormality. 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 Small vessel ischemic changes. No evidence of maxillofacial fracture. No evidence of traumatic injury to the cervical spine. Mild to moderate degenerative changes. Electronically Signed   By: Julian Hy M.D.   On: 09/10/2018 11:52    Pending Labs Unresulted Labs (From admission, onward)    Start     Ordered   09/11/18 0500  Protime-INR  Tomorrow morning,   STAT     09/10/18 1524   09/11/18 0500  Cortisol-am, blood  Tomorrow morning,   STAT     09/10/18 1524   09/11/18 0500  Procalcitonin  Tomorrow morning,   STAT     09/10/18 1524   09/11/18 0175  Basic metabolic panel  Tomorrow morning,   STAT     09/10/18 1527   09/11/18 0500  CBC  Tomorrow morning,   STAT     09/10/18 1527   09/11/18 0500  Magnesium  Tomorrow morning,   STAT     09/10/18 1527   09/11/18 0500  Hemoglobin A1c  Tomorrow morning,   STAT    Comments:  To assess prior glycemic  control    09/10/18 1527   09/11/18 0500  TSH  Tomorrow morning,   STAT     09/10/18 1527          Vitals/Pain Today's Vitals   09/10/18 1215 09/10/18 1253 09/10/18 1600 09/10/18 1613  BP:   117/63   Pulse: 91 92 92   Resp: (!) 24 (!) 21 (!) 23   Temp:      TempSrc:      SpO2: (!) 89% 96% 100%   Weight:      Height:      PainSc:    2     Isolation Precautions No active isolations  Medications Medications  acetaminophen (TYLENOL) tablet 650 mg (has no administration in time range)  colchicine tablet 0.6 mg (has no administration in time range)  traMADol (ULTRAM) tablet 50 mg (has no administration in time range)  atorvastatin (LIPITOR) tablet 40 mg (40 mg Oral Given 09/10/18 1612)  ezetimibe (ZETIA) tablet 10 mg (has no administration in time range)  metoprolol succinate (TOPROL-XL) 24 hr tablet 50 mg (has no administration in time range)  nitroGLYCERIN (NITROSTAT) SL tablet 0.4 mg (has no administration in time range)  ALPRAZolam (XANAX) tablet 0.25 mg (has no administration in time range)  citalopram (CELEXA) tablet 10 mg (has no administration in time range)  traZODone (DESYREL) tablet 100 mg (has no administration in time range)  insulin detemir (LEVEMIR) injection 28 Units (has no administration in time range)  levothyroxine (  SYNTHROID) tablet 50 mcg (has no administration in time range)  predniSONE (DELTASONE) tablet 10 mg (has no administration in time range)  finasteride (PROSCAR) tablet 5 mg (5 mg Oral Given 09/10/18 2005)  tamsulosin (FLOMAX) capsule 0.4 mg (0.4 mg Oral Given 09/10/18 1612)  apixaban (ELIQUIS) tablet 2.5 mg (has no administration in time range)  gabapentin (NEURONTIN) capsule 100 mg (100 mg Oral Given 09/10/18 1626)  albuterol (PROVENTIL) (2.5 MG/3ML) 0.083% nebulizer solution 2.5 mg (has no administration in time range)  montelukast (SINGULAIR) tablet 10 mg (has no administration in time range)  mometasone-formoterol (DULERA) 200-5 MCG/ACT  inhaler 2 puff (2 puffs Inhalation Not Given 09/10/18 2015)  umeclidinium bromide (INCRUSE ELLIPTA) 62.5 MCG/INH 1 puff (has no administration in time range)  clotrimazole (LOTRIMIN) 1 % cream 1 application (has no administration in time range)  diclofenac sodium (VOLTAREN) 1 % transdermal gel 4 g (4 g Topical Not Given 09/10/18 1751)  dorzolamide (TRUSOPT) 2 % ophthalmic solution 1 drop (1 drop Both Eyes Not Given 09/10/18 1751)  latanoprost (XALATAN) 0.005 % ophthalmic solution 1 drop (has no administration in time range)  nystatin-triamcinolone (MYCOLOG II) cream 1 application (has no administration in time range)  0.9 %  sodium chloride infusion ( Intravenous New Bag/Given 09/10/18 2004)  insulin aspart (novoLOG) injection 0-9 Units (1 Units Subcutaneous Given 09/10/18 1625)  insulin aspart (novoLOG) injection 0-5 Units (has no administration in time range)  vancomycin (VANCOCIN) 1,250 mg in sodium chloride 0.9 % 250 mL IVPB (has no administration in time range)  ceFEPIme (MAXIPIME) 2 g in sodium chloride 0.9 % 100 mL IVPB (has no administration in time range)  brimonidine (ALPHAGAN) 0.2 % ophthalmic solution 1 drop (1 drop Both Eyes Given 09/10/18 2010)    And  timolol (TIMOPTIC) 0.5 % ophthalmic solution 1 drop (1 drop Both Eyes Given 09/10/18 2010)  sodium chloride 0.9 % bolus 500 mL (0 mLs Intravenous Stopped 09/10/18 1514)  sodium chloride 0.9 % bolus 500 mL (0 mLs Intravenous Stopped 09/10/18 1630)  cefTRIAXone (ROCEPHIN) 2 g in sodium chloride 0.9 % 100 mL IVPB (0 g Intravenous Stopped 09/10/18 1543)  vancomycin (VANCOCIN) 1,750 mg in sodium chloride 0.9 % 500 mL IVPB (0 mg Intravenous Stopped 09/10/18 1905)    Mobility walks with device High fall risk   Focused Assessments Cardiac Assessment Handoff:    Lab Results  Component Value Date   CKTOTAL 17 (L) 05/02/2014   CKMB 0.6 05/02/2014   TROPONINI <0.03 08/24/2018   No results found for: DDIMER Does the Patient currently have  chest pain? No     R Recommendations: See Admitting Provider Note  Report given to:   Additional Notes: Pt family up

## 2018-09-10 NOTE — Consult Note (Addendum)
[facility] Nurse wound consult note Reason for Consult: Upper extremity healing skin tears from traumatic fall with injury. 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 Seen by Podiatry in community and during last admission. See consultation note by Dr. Vickki Muff dated 08/25/18 for left foot ulceration. He notes that subsequent surgery is likely indicated, but that Vascular surgery may also need to be consulted for the left foot.  Dressing procedure/placement/frequency: Topical care orders are provided for Nursing for continuation of conservative care for the upper extremity wounds.  Note: Patient was seen by my partner, S. Tora Perches during last admission. See her note dated 08/25/18.  I discussed the recommendation to consult Podiatry and Vascular if indicated with Dr. Stark Jock this evening. He indicates that he will consult with Podiatry for care recommendations for the left foot.  [facility] nursing team will not follow, but will remain available to this patient, the nursing and medical teams.  Please re-consult if needed.  Thanks, Maudie Flakes, MSN, RN, [place], [facility]  Pager# [phone]

## 2018-09-10 NOTE — ED Notes (Signed)
Patient transported to CT 

## 2018-09-10 NOTE — Progress Notes (Addendum)
Pharmacy Antibiotic Note  [name]. is a 85 y.o. male admitted on 09/10/2018.  Pharmacy has been consulted for vancomycin and cefepime dosing for sepsis.  Plan: Vancomycin 1750 mg IV x 1 loading dose given at 1600. Will start maintenance dose tomorrow at 1800 (half life ~ 25 hours) Vancomycin 1250 mg IV Q 48 hrs. Goal AUC 400-550. Expected AUC: 545 SCr used: 1.82 (appears to be elevated above baseline)  Cefepime 2 g IV q12h to start at 2200 (to separate from ceftriaxone)   Height: 5\' 6"  (167.6 cm) Weight: 187 lb (84.8 kg) IBW/kg (Calculated) : 63.8  Temp (24hrs), Avg:97.8 F (36.6 C), Min:97.8 F (36.6 C), Max:97.8 F (36.6 C)  Recent Labs  Lab 09/10/18 1041 09/10/18 1213 09/10/18 1542  WBC 17.1*  --   --   CREATININE 1.82*  --   --   LATICACIDVEN  --  3.2* 1.7    Estimated Creatinine Clearance: 30.9 mL/min (A) (by C-G formula based on SCr of 1.82 mg/dL (H)).    Allergies  Allergen Reactions  . Contrast Media [Iodinated Diagnostic Agents] Shortness Of Breath  . Morphine And Related Other (See Comments)    Hallucinations   . Niacin And Related Dermatitis  . Other     Other reaction(s): SHORTNESS OF BREATH  . Amlodipine Rash    Antimicrobials this admission: Ceftriaxone 5/31 x 1 Vancomycin 5/31 >> Cefepime 5/31 >>  Dose adjustments this admission:   Microbiology results: BCx: UCx:   Sputum:  MRSA PCR:   Thank you for allowing pharmacy to be a part of this patient's care.  Tawnya Crook, PharmD Pharmacy Resident  09/10/2018 4:28 PM

## 2018-09-10 NOTE — ED Triage Notes (Signed)
Mechanical fall from  Home. Arrived by EMS. Skin tear present on right upper arm and Left lower arm. Takes eliquis daily. Patient did hit head, Denies LOC. Blood sugar 98 from EMS. A&O x4 upon arrival. Baseline uses walker

## 2018-09-10 NOTE — H&P (Addendum)
[facility] NAME: [name]    MR#:  [id number]  DATE OF BIRTH:  1932/01/17  DATE OF ADMISSION:  09/10/2018  PRIMARY CARE PHYSICIAN: Jearld Fenton, NP   REQUESTING/REFERRING PHYSICIAN: Arta Silence  CHIEF COMPLAINT:   Chief Complaint  Patient presents with   Fall    HISTORY OF PRESENT ILLNESS:  [name]  is a 85 y.o. male with a known history of chronic atrial fibrillation, chronic diastolic CHF hypertension, diabetes mellitus, COPD, hyperlipidemia and hypothyroidism who presented to the emergency room after mechanical fall at home.  Patient tripped and fell due to the hard soled shoes he was wearing on his left foot following previous amputation for the left great toe by podiatrist.  Patient did not hit his head.  Did not lose consciousness.  Sustained some abrasions over the bridge of his nose and left upper extremity.  Imaging studies of the head with no acute intracranial findings.  No evidence of traumatic injury to cervical spine.  Patient was evaluated in the emergency room and diagnosed with sepsis secondary to urinary tract infection.  Findings of cellulitis of left lower extremity was admitted.  Was given a dose of IV vancomycin and Rocephin in the emergency room.  Medical service called to admit patient for further evaluation and management.  PAST MEDICAL HISTORY:   Past Medical History:  Diagnosis Date   Arthritis    CAD    a. MI 01/29/1996 tx'd w/ TPA @ [place] [place]; b. Myoview 06/2005: EF 50%, scar @ apex, mild peri-infarct ischemia   Cancer ([place])    skin   Chronic atrial fibrillation    a. since 2006; b. on warfarin   Chronic diastolic CHF (congestive heart failure) ([place])    a. echo 04/2006: EF lower limits of nl, mod LVH, mild aortic root dilatation, & mild MR, biatrial enlargement; b. echo 04/2013: EF 60%, mod dilated LA, mild MR & TR, mod pulm HTN w/ RV systolic pressure 53, c. echo 04/21/14: EF  55-60%, unable to exclude WMA, severely dilated LA 6.6 cm, nl RVSP, mildly dilated aortic root   COPD (chronic obstructive pulmonary disease) (HCC)    oxygen prn at home   CVA [phone]   x2   DM    Falls    GERD (gastroesophageal reflux disease)    History of kidney stones    HYPERLIPIDEMIA    HYPERTENSION    Kidney stone    a. s/p left ureteral stenting 04/24/14   Left arm weakness    limited movement. S/P fall injury   Neuropathy of both feet    Poor balance    Wears dentures    full upper and lower    PAST SURGICAL HISTORY:   Past Surgical History:  Procedure Laterality Date   AMPUTATION TOE Left 06/13/2018   Procedure: 1st Ray Resection Left;  Surgeon: Sharlotte Alamo, DPM;  Location: [facility];  Service: Podiatry;  Laterality: Left;   BLADDER SURGERY     stent placement    [place]   CATARACT EXTRACTION W/PHACO Left 10/11/2017   Procedure: CATARACT EXTRACTION PHACO AND INTRAOCULAR LENS PLACEMENT ([place]) COMPLICATED LEFT DIABETIC;  Surgeon: Leandrew Koyanagi, MD;  Location: [facility];  Service: Ophthalmology;  Laterality: Left;  MALYUGIN Diabetic - insulin   CATARACT EXTRACTION W/PHACO Right 11/30/2017   Procedure: CATARACT EXTRACTION PHACO AND INTRAOCULAR LENS PLACEMENT ([facility]) COMPLICATED  RIGHT DIABETIC;  Surgeon: Leandrew Koyanagi, MD;  Location: [facility];  Service: Ophthalmology;  Laterality: Right;  Diabetic - insulin   CIRCUMCISION  2016   CORONARY ANGIOPLASTY  1997   s/p stent placement x 2    CYSTOSCOPY W/ URETERAL STENT REMOVAL Left 10/09/2014   Procedure: CYSTOSCOPY WITH STENT REMOVAL;  Surgeon: Hollice Espy, MD;  Location: [facility];  Service: Urology;  Laterality: Left;   CYSTOSCOPY WITH STENT PLACEMENT Left 10/09/2014   Procedure: CYSTOSCOPY WITH STENT PLACEMENT;  Surgeon: Hollice Espy, MD;  Location: [facility];  Service: Urology;  Laterality: Left;   KIDNEY  SURGERY  05/2013   s/p stent placement    LOWER EXTREMITY ANGIOGRAPHY Left 06/12/2018   Procedure: Lower Extremity Angiography;  Surgeon: Algernon Huxley, MD;  Location: [facility];  Service: Cardiovascular;  Laterality: Left;   stents ureters Bilateral    TONSILLECTOMY AND ADENOIDECTOMY  1959   URETEROSCOPY WITH HOLMIUM LASER LITHOTRIPSY Left 10/09/2014   Procedure: URETEROSCOPY WITH HOLMIUM LASER LITHOTRIPSY;  Surgeon: Hollice Espy, MD;  Location: [facility];  Service: Urology;  Laterality: Left;    SOCIAL HISTORY:   Social History   Tobacco Use   Smoking status: Former Smoker    Packs/day: 2.00    Years: 40.00    Pack years: 80.00    Types: Cigarettes    Last attempt to quit: 05/24/1990    Years since quitting: 28.3   Smokeless tobacco: Former Systems developer    Quit date: 05/24/1990  Substance Use Topics   Alcohol use: No    FAMILY HISTORY:   Family History  Problem Relation Age of Onset   Heart disease Mother    Heart disease Maternal Grandmother    Diabetes Maternal Grandmother    Cancer Neg Hx    Stroke Neg Hx     DRUG ALLERGIES:   Allergies  Allergen Reactions   Contrast Media [Iodinated Diagnostic Agents] Shortness Of Breath   Morphine And Related Other (See Comments)    Hallucinations    Niacin And Related Dermatitis   Other     Other reaction(s): SHORTNESS OF BREATH   Amlodipine Rash    REVIEW OF SYSTEMS:   Review of Systems  Constitutional: Negative for chills and fever.  HENT: Negative for hearing loss and tinnitus.   Eyes: Negative for blurred vision and double vision.  Respiratory: Negative for cough and hemoptysis.   Cardiovascular: Negative for chest pain and palpitations.  Gastrointestinal: Negative for heartburn, nausea and vomiting.  Genitourinary: Positive for dysuria. Negative for frequency and urgency.  Musculoskeletal: Negative for myalgias and neck pain.  Skin: Negative for itching and rash.  Neurological: Negative for  dizziness and headaches.  Psychiatric/Behavioral: Negative for depression and hallucinations.    MEDICATIONS AT HOME:   Prior to Admission medications   Medication Sig Start Date End Date Taking? Authorizing Provider  acetaminophen (TYLENOL) 650 MG CR tablet Take 650 mg by mouth every 8 (eight) hours as needed for pain.     [provider]  albuterol (PROAIR HFA) 108 (90 Base) MCG/ACT inhaler USE 1 TO 2 INHALATIONS EVERY 4 HOURS AS NEEDED Patient taking differently: Inhale 1-2 puffs into the lungs every 4 (four) hours as needed for wheezing or shortness of breath.  11/09/17   Wilhelmina Mcardle, MD  ALPRAZolam Duanne Moron) 0.25 MG tablet Take 1 tablet (0.25 mg total) by mouth at bedtime as needed for anxiety. 07/19/18   Jearld Fenton, NP  apixaban (ELIQUIS) 2.5 MG TABS  tablet Take 1 tablet (2.5 mg total) by mouth 2 (two) times daily. 03/27/18   Minna Merritts, MD  atorvastatin (LIPITOR) 40 MG tablet TAKE 1 TABLET DAILY Patient taking differently: Take 40 mg by mouth daily at 6 PM.  07/17/18   Jearld Fenton, NP  citalopram (CELEXA) 10 MG tablet TAKE 1 TABLET DAILY 07/18/18   Jearld Fenton, NP  clotrimazole (LOTRIMIN) 1 % cream Apply 1 application topically 2 (two) times daily. 11/18/17   Bedsole, Amy E, MD  COLCRYS 0.6 MG tablet TAKE 1 TABLET TWICE A DAY AS NEEDED (ACUTE GOUT FLARE) USE AS DIRECTED 07/06/18   Baity, Coralie Keens, NP  COMBIGAN 0.2-0.5 % ophthalmic solution Place 1 drop into both eyes daily. 06/26/18   [provider]  diclofenac sodium (VOLTAREN) 1 % GEL Apply 4 g topically 4 (four) times daily. 01/05/18   Copland, Frederico Hamman, MD  dorzolamide (TRUSOPT) 2 % ophthalmic solution Place 1 drop into both eyes 3 (three) times daily.  03/31/16   [provider]  ezetimibe (ZETIA) 10 MG tablet TAKE 1 TABLET DAILY 07/17/18   Jearld Fenton, NP  finasteride (PROSCAR) 5 MG tablet TAKE 1 TABLET DAILY Patient taking differently: Take 5 mg by mouth daily.  06/29/17   Jearld Fenton,  NP  gabapentin (NEURONTIN) 100 MG capsule TAKE 1 CAPSULE THREE TIMES A DAY Patient taking differently: Take 100 mg by mouth at bedtime.  06/09/18   Jearld Fenton, NP  insulin detemir (LEVEMIR) 100 UNIT/ML injection Inject 0.28 mLs (28 Units total) into the skin daily at 10 pm. Patient taking differently: Inject 42 Units into the skin daily at 10 pm.  06/15/18   Fritzi Mandes, MD  insulin lispro (HUMALOG) 100 UNIT/ML injection Inject 0.04 mLs (4 Units total) into the skin 3 (three) times daily with meals. Patient taking differently: Inject 10-18 Units into the skin 3 (three) times daily with meals. 18 am, 3 lunch, 10 supper 06/15/18   Fritzi Mandes, MD  Insulin Pen Needle (BD PEN NEEDLE NANO U/F) 32G X 4 MM MISC USE THREE TIMES A DAY FOR INSULIN ADMINISTRATION 02/15/18   Jearld Fenton, NP  Insulin Syringe-Needle U-100 30G X 1/2" 1 ML MISC 1 each by Does not apply route 3 (three) times daily. 07/19/14   Jearld Fenton, NP  latanoprost (XALATAN) 0.005 % ophthalmic solution Place 1 drop into both eyes at bedtime.  04/18/16   [provider]  levothyroxine (SYNTHROID, LEVOTHROID) 50 MCG tablet TAKE 1 TABLET DAILY BEFORE BREAKFAST 06/27/18   Jearld Fenton, NP  metolazone (ZAROXOLYN) 5 MG tablet Take 1 tablet (5 mg total) by mouth daily as needed (swelling). 03/13/18   Minna Merritts, MD  metoprolol succinate (TOPROL-XL) 50 MG 24 hr tablet TAKE 1 TABLET TWICE A DAY 06/09/18   Jearld Fenton, NP  montelukast (SINGULAIR) 10 MG tablet TAKE 1 TABLET AT BEDTIME Patient taking differently: Take 10 mg by mouth at bedtime.  02/24/18   Jearld Fenton, NP  nitroGLYCERIN (NITROSTAT) 0.4 MG SL tablet Place 0.4 mg under the tongue every 5 (five) minutes as needed.      [provider]  nystatin-triamcinolone (MYCOLOG II) cream Apply 1 application topically 2 (two) times daily.  03/12/14   [provider]  potassium chloride SA (K-DUR,KLOR-CON) 20 MEQ tablet Take 4 tablets (80 meq) by mouth twice  daily, take an extra 1 tablet (20 meq) on the days you take metolazone Patient taking differently: Take  80 mEq by mouth 2 (two) times daily. Take 4 tablets (80 meq) by mouth twice daily at breakfast and at lunch, take an extra 1 tablet (20 meq) on the days you take metolazone put tablet in applesauce to aid swallowing 02/10/18   Minna Merritts, MD  predniSONE (DELTASONE) 10 MG tablet Take 1 tablet (10 mg total) by mouth daily with breakfast. 05/05/18   Jearld Fenton, NP  sulfamethoxazole-trimethoprim (BACTRIM DS) 800-160 MG tablet Take 1 tablet by mouth 2 (two) times daily. 09/06/18   Jearld Fenton, NP  SYMBICORT 160-4.5 MCG/ACT inhaler USE 2 INHALATIONS TWICE A DAY Patient taking differently: Inhale 1 puff into the lungs 2 (two) times daily.  08/31/17   Wilhelmina Mcardle, MD  tamsulosin (FLOMAX) 0.4 MG CAPS capsule TAKE 1 CAPSULE DAILY 07/18/18   Jearld Fenton, NP  torsemide (DEMADEX) 20 MG tablet Take 40 mg by mouth 2 (two) times daily.    [provider]  traMADol (ULTRAM) 50 MG tablet Take 1 tablet (50 mg total) by mouth 2 (two) times daily. 06/29/18   Jearld Fenton, NP  traZODone (DESYREL) 50 MG tablet Take 2 tablets (100 mg total) by mouth at bedtime as needed for sleep. 02/16/18   Jearld Fenton, NP  umeclidinium bromide (INCRUSE ELLIPTA) 62.5 MCG/INH AEPB Inhale 1 puff into the lungs daily.    [provider]      VITAL SIGNS:  Blood pressure 133/76, pulse 92, temperature 97.8 F (36.6 C), temperature source Oral, resp. rate (!) 21, height 5\' 6"  (1.676 m), weight 84.8 kg, SpO2 96 %.  PHYSICAL EXAMINATION:  Physical Exam  GENERAL:  85 y.o.-year-old patient lying in the bed with no acute distress.  EYES: Pupils equal, round, reactive to light and accommodation. No scleral icterus. Extraocular muscles intact.  HEENT: Abrasions over his nose and right upper arm with dressing in place.  Dry oral mucosa.Marland Kitchen  NECK:  Supple, no jugular venous distention. No thyroid  enlargement, no tenderness.  LUNGS: Normal breath sounds bilaterally, no wheezing, rales,rhonchi or crepitation. No use of accessory muscles of respiration.  CARDIOVASCULAR: S1, S2 normal. No murmurs, rubs, or gallops.  ABDOMEN: Soft, nontender, nondistended. Bowel sounds present. No organomegaly or mass.  EXTREMITIES: Area of cellulitis of left lower extremity.  Dressing in place to site of prior amputation by podiatrist on the left lower extremity.  NEUROLOGIC: Cranial nerves II through XII are intact. Muscle strength 5/5 in all extremities. Sensation intact. Gait not checked.  PSYCHIATRIC: The patient is alert and oriented x 3.  SKIN: There is of abrasions and ecchymosis.    LABORATORY PANEL:   CBC Recent Labs  Lab 09/10/18 1041  WBC 17.1*  HGB 13.2  HCT 40.9  PLT 278   ------------------------------------------------------------------------------------------------------------------  Chemistries  Recent Labs  Lab 09/10/18 1041  NA 138  K 4.5  CL 102  CO2 26  GLUCOSE 98  BUN 40*  CREATININE 1.82*  CALCIUM 8.9   ------------------------------------------------------------------------------------------------------------------  Cardiac Enzymes No results for input(s): TROPONINI in the last 168 hours. ------------------------------------------------------------------------------------------------------------------  RADIOLOGY:  Dg Ribs Unilateral W/chest Right  Result Date: 09/10/2018 CLINICAL DATA:  Right anterior lower rib pain after fall EXAM: RIGHT RIBS AND CHEST - 3+ VIEW COMPARISON:  08/24/2018 chest radiograph. FINDINGS: Stable cardiomediastinal silhouette with mild cardiomegaly. No pneumothorax. No pleural effusion. Diffuse prominence of the central interstitial markings, slightly decreased from prior, favor mild pulmonary edema. No fracture or suspicious focal osseous lesion seen in the right ribs.  IMPRESSION: 1. Cardiomegaly with diffuse prominence of the central  interstitial markings, favor mild pulmonary edema, slightly improved from prior. 2. No right rib fracture detected. Should the patient's symptoms persist or worsen, repeat radiographs of the ribs in 10 - 14 days maybe of use to detect subtle nondisplaced rib fractures (which are commonly occult on initial imaging). Electronically Signed   By: Ilona Sorrel M.D.   On: 09/10/2018 12:06   Ct Head Wo Contrast  Result Date: 09/10/2018 CLINICAL DATA:  Fall EXAM: CT HEAD WITHOUT CONTRAST CT MAXILLOFACIAL WITHOUT CONTRAST CT CERVICAL SPINE WITHOUT CONTRAST TECHNIQUE: Multidetector CT imaging of the head, cervical spine, and maxillofacial structures were performed using the standard protocol without intravenous contrast. Multiplanar CT image reconstructions of the cervical spine and maxillofacial structures were also generated. COMPARISON:  CT head dated 08/11/2018 FINDINGS: Motion degraded images. CT HEAD FINDINGS Brain: No evidence of acute infarction, hemorrhage, hydrocephalus, extra-axial collection or mass lesion/mass effect. Encephalomalacic changes in the right occipital lobe, chronic. Subcortical white matter and periventricular small vessel ischemic changes. Vascular: Intracranial atherosclerosis. Skull: Normal. Negative for fracture or focal lesion. Other: None. CT MAXILLOFACIAL FINDINGS Osseous: No evidence of maxillofacial fracture. The mandible is intact. The bilateral mandibular condyles are well-seated in the TMJs. Orbits: The bilateral orbits, including the globes and retroconal soft tissues, are within normal limits. Sinuses: The visualized paranasal sinuses are essentially clear. The mastoid air cells are unopacified. Soft tissues: Negative. CT CERVICAL SPINE FINDINGS Alignment: Straightening of the normal upper cervical lordosis. Skull base and vertebrae: No acute fracture. No primary bone lesion or focal pathologic process. Soft tissues and spinal canal: No prevertebral fluid or swelling. No visible  canal hematoma. Disc levels: Mild to moderate degenerative changes of the mid/lower cervical spine. Spinal canal is patent. Upper chest: Visualized thyroid is grossly unremarkable. Other: None. IMPRESSION: Motion degraded images. No evidence of acute intracranial abnormality. 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 Small vessel ischemic changes. No evidence of maxillofacial fracture. No evidence of traumatic injury to the cervical spine. Mild to moderate degenerative changes. Electronically Signed   By: Julian Hy M.D.   On: 09/10/2018 11:52   Ct Cervical Spine Wo Contrast  Result Date: 09/10/2018 CLINICAL DATA:  Fall EXAM: CT HEAD WITHOUT CONTRAST CT MAXILLOFACIAL WITHOUT CONTRAST CT CERVICAL SPINE WITHOUT CONTRAST TECHNIQUE: Multidetector CT imaging of the head, cervical spine, and maxillofacial structures were performed using the standard protocol without intravenous contrast. Multiplanar CT image reconstructions of the cervical spine and maxillofacial structures were also generated. COMPARISON:  CT head dated 08/11/2018 FINDINGS: Motion degraded images. CT HEAD FINDINGS Brain: No evidence of acute infarction, hemorrhage, hydrocephalus, extra-axial collection or mass lesion/mass effect. Encephalomalacic changes in the right occipital lobe, chronic. Subcortical white matter and periventricular small vessel ischemic changes. Vascular: Intracranial atherosclerosis. Skull: Normal. Negative for fracture or focal lesion. Other: None. CT MAXILLOFACIAL FINDINGS Osseous: No evidence of maxillofacial fracture. The mandible is intact. The bilateral mandibular condyles are well-seated in the TMJs. Orbits: The bilateral orbits, including the globes and retroconal soft tissues, are within normal limits. Sinuses: The visualized paranasal sinuses are essentially clear. The mastoid air cells are unopacified. Soft tissues: Negative. CT CERVICAL SPINE FINDINGS Alignment: Straightening of the normal upper cervical lordosis.  Skull base and vertebrae: No acute fracture. No primary bone lesion or focal pathologic process. Soft tissues and spinal canal: No prevertebral fluid or swelling. No visible canal hematoma. Disc levels: Mild to moderate degenerative changes of the mid/lower cervical spine. Spinal canal is patent. Upper  chest: Visualized thyroid is grossly unremarkable. Other: None. IMPRESSION: Motion degraded images. No evidence of acute intracranial abnormality. 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 Small vessel ischemic changes. No evidence of maxillofacial fracture. No evidence of traumatic injury to the cervical spine. Mild to moderate degenerative changes. Electronically Signed   By: Julian Hy M.D.   On: 09/10/2018 11:52   Ct Maxillofacial Wo Contrast  Result Date: 09/10/2018 CLINICAL DATA:  Fall EXAM: CT HEAD WITHOUT CONTRAST CT MAXILLOFACIAL WITHOUT CONTRAST CT CERVICAL SPINE WITHOUT CONTRAST TECHNIQUE: Multidetector CT imaging of the head, cervical spine, and maxillofacial structures were performed using the standard protocol without intravenous contrast. Multiplanar CT image reconstructions of the cervical spine and maxillofacial structures were also generated. COMPARISON:  CT head dated 08/11/2018 FINDINGS: Motion degraded images. CT HEAD FINDINGS Brain: No evidence of acute infarction, hemorrhage, hydrocephalus, extra-axial collection or mass lesion/mass effect. Encephalomalacic changes in the right occipital lobe, chronic. Subcortical white matter and periventricular small vessel ischemic changes. Vascular: Intracranial atherosclerosis. Skull: Normal. Negative for fracture or focal lesion. Other: None. CT MAXILLOFACIAL FINDINGS Osseous: No evidence of maxillofacial fracture. The mandible is intact. The bilateral mandibular condyles are well-seated in the TMJs. Orbits: The bilateral orbits, including the globes and retroconal soft tissues, are within normal limits. Sinuses: The visualized paranasal sinuses are  essentially clear. The mastoid air cells are unopacified. Soft tissues: Negative. CT CERVICAL SPINE FINDINGS Alignment: Straightening of the normal upper cervical lordosis. Skull base and vertebrae: No acute fracture. No primary bone lesion or focal pathologic process. Soft tissues and spinal canal: No prevertebral fluid or swelling. No visible canal hematoma. Disc levels: Mild to moderate degenerative changes of the mid/lower cervical spine. Spinal canal is patent. Upper chest: Visualized thyroid is grossly unremarkable. Other: None. IMPRESSION: Motion degraded images. No evidence of acute intracranial abnormality. 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 Small vessel ischemic changes. No evidence of maxillofacial fracture. No evidence of traumatic injury to the cervical spine. Mild to moderate degenerative changes. Electronically Signed   By: Julian Hy M.D.   On: 09/10/2018 11:52      IMPRESSION AND PLAN:  Patient is an 56 left male with history of chronic atrial fibrillation, chronic diastolic CHF hypertension, diabetes mellitus, COPD, hyperlipidemia and hypothyroidism who presented to the emergency room after mechanical fall at home.  Patient was diagnosed with sepsis secondary to urinary tract infection.  1.  Sepsis secondary to urinary tract infection. Evidence of UTI on urinalysis.  Elevated WBC count on respiratory rate.  Lactic acid level also elevated. Placed on broad-spectrum IV antibiotics with vancomycin and cefepime pending results of urine culture IV fluid hydration while monitoring cardiopulmonary status closely.  2.  Mechanical fall at home. Multiple areas of abrasions.  No fractures on imaging. Physical therapist to evaluate in a.m.  3.  Cellulitis of left lower extremity. Patient status post previous amputation of left great toe by podiatrist.  Has an appointment with podiatrist on June 9.  Also has an appointment with vascular surgery for evaluation of peripheral artery  disease tomorrow. I discussed case with wound care nurse.  Decision was to get podiatry consult for further input since there was already plans for possible further intervention.  Consult placed for podiatrist  4.  Chronic atrial fibrillation Rate controlled.  Patient on anticoagulation with Eliquis.  Continue the same.  5.  Hypothyroidism Continue Synthroid.  TSH level in a.m.  6.  Diabetes mellitus type 2 Continued home insulin regimen.  Placed on sliding scale insulin coverage. Glycosylated  hemoglobin level in a.m.  DVT prophylaxis; patient already on Eliquis  All the records are reviewed and case discussed with ED provider. Management plans discussed with the patient, he is in agreement.  CODE STATUS: Full code  TOTAL TIME TAKING CARE OF THIS PATIENT: 63 minutes.    Denisia Harpole M.D on 09/10/2018 at 3:28 PM  Between 7am to 6pm - Pager - [phone]  After 6pm go to [email or web address] - Proofreader  [facility]  Office  [phone]  CC: Primary care physician; Jearld Fenton, NP   Note: This dictation was prepared with Dragon dictation along with smaller phrase technology. Any transcriptional errors that result from this process are unintentional.

## 2018-09-10 NOTE — ED Notes (Signed)
Almyra Free, RN, report given att

## 2018-09-10 NOTE — ED Notes (Signed)
Pt updating wife and daughter, [name], updated

## 2018-09-10 NOTE — ED Notes (Signed)
Denies chest pain at this time.

## 2018-09-10 NOTE — ED Notes (Signed)
Pt transferred to room 33, pt oriented to new room and staff.  Pt provided dinner tray, pt voices no other needs or concerns at this time, will continue to monitor.

## 2018-09-11 ENCOUNTER — Encounter (INDEPENDENT_AMBULATORY_CARE_PROVIDER_SITE_OTHER): Payer: Medicare Other

## 2018-09-11 DIAGNOSIS — Z87891 Personal history of nicotine dependence: Secondary | ICD-10-CM

## 2018-09-11 DIAGNOSIS — L97529 Non-pressure chronic ulcer of other part of left foot with unspecified severity: Secondary | ICD-10-CM

## 2018-09-11 DIAGNOSIS — E1151 Type 2 diabetes mellitus with diabetic peripheral angiopathy without gangrene: Secondary | ICD-10-CM

## 2018-09-11 DIAGNOSIS — I509 Heart failure, unspecified: Secondary | ICD-10-CM

## 2018-09-11 DIAGNOSIS — I70245 Atherosclerosis of native arteries of left leg with ulceration of other part of foot: Secondary | ICD-10-CM

## 2018-09-11 DIAGNOSIS — I10 Essential (primary) hypertension: Secondary | ICD-10-CM

## 2018-09-11 DIAGNOSIS — L8990 Pressure ulcer of unspecified site, unspecified stage: Secondary | ICD-10-CM

## 2018-09-11 DIAGNOSIS — E785 Hyperlipidemia, unspecified: Secondary | ICD-10-CM

## 2018-09-11 DIAGNOSIS — Z79899 Other long term (current) drug therapy: Secondary | ICD-10-CM

## 2018-09-11 DIAGNOSIS — Z95820 Peripheral vascular angioplasty status with implants and grafts: Secondary | ICD-10-CM

## 2018-09-11 LAB — CBC
HCT: 40.1 % (ref 39.0–52.0)
Hemoglobin: 12.7 g/dL — ABNORMAL LOW (ref 13.0–17.0)
MCH: 29 pg (ref 26.0–34.0)
MCHC: 31.7 g/dL (ref 30.0–36.0)
MCV: 91.6 fL (ref 80.0–100.0)
Platelets: 245 10*3/uL (ref 150–400)
RBC: 4.38 MIL/uL (ref 4.22–5.81)
RDW: 17.2 % — ABNORMAL HIGH (ref 11.5–15.5)
WBC: 12.2 10*3/uL — ABNORMAL HIGH (ref 4.0–10.5)
nRBC: 0 % (ref 0.0–0.2)

## 2018-09-11 LAB — GLUCOSE, CAPILLARY
Glucose-Capillary: 62 mg/dL — ABNORMAL LOW (ref 70–99)
Glucose-Capillary: 89 mg/dL (ref 70–99)
Glucose-Capillary: 171 mg/dL — ABNORMAL HIGH (ref 70–99)
Glucose-Capillary: 229 mg/dL — ABNORMAL HIGH (ref 70–99)
Glucose-Capillary: 232 mg/dL — ABNORMAL HIGH (ref 70–99)

## 2018-09-11 LAB — BASIC METABOLIC PANEL
Anion gap: 8 (ref 5–15)
BUN: 39 mg/dL — ABNORMAL HIGH (ref 8–23)
CO2: 27 mmol/L (ref 22–32)
Calcium: 8.9 mg/dL (ref 8.9–10.3)
Chloride: 108 mmol/L (ref 98–111)
Creatinine, Ser: 1.58 mg/dL — ABNORMAL HIGH (ref 0.61–1.24)
GFR calc Af Amer: 46 mL/min — ABNORMAL LOW (ref >60)
GFR calc non Af Amer: 40 mL/min — ABNORMAL LOW (ref >60)
Glucose, Bld: 147 mg/dL — ABNORMAL HIGH (ref 70–99)
Potassium: 4.3 mmol/L (ref 3.5–5.1)
Sodium: 143 mmol/L (ref 135–145)

## 2018-09-11 LAB — HEMOGLOBIN A1C
Hgb A1c MFr Bld: 7.4 % — ABNORMAL HIGH (ref 4.8–5.6)
Mean Plasma Glucose: 165.68 mg/dL

## 2018-09-11 LAB — TSH: TSH: 5.036 u[IU]/mL — ABNORMAL HIGH (ref 0.350–4.500)

## 2018-09-11 LAB — PROTIME-INR
INR: 1.2 (ref 0.8–1.2)
Prothrombin Time: 15.3 seconds — ABNORMAL HIGH (ref 11.4–15.2)

## 2018-09-11 LAB — CORTISOL-AM, BLOOD: Cortisol - AM: 5.1 ug/dL — ABNORMAL LOW (ref 6.7–22.6)

## 2018-09-11 LAB — PROCALCITONIN: Procalcitonin: <0.1 ng/mL

## 2018-09-11 LAB — MAGNESIUM: Magnesium: 2.4 mg/dL (ref 1.7–2.4)

## 2018-09-11 MED ORDER — VANCOMYCIN HCL 1.25 G IV SOLR
1250.0000 mg | INTRAVENOUS | Status: DC
  Administered 2018-09-11: 1250 mg via INTRAVENOUS
  Filled 2018-09-11: qty 1250

## 2018-09-11 MED ORDER — INSULIN DETEMIR 100 UNIT/ML ~~LOC~~ SOLN
25.0000 [IU] | Freq: Every day | SUBCUTANEOUS | Status: DC
  Administered 2018-09-11 – 2018-09-15 (×5): 25 [IU] via SUBCUTANEOUS
  Filled 2018-09-11 (×6): qty 0.25

## 2018-09-11 NOTE — Progress Notes (Signed)
PT Cancellation Note  Patient Details Name: [name]. MRN: [id number] DOB: 25-Dec-1931   Cancelled Treatment:    Reason Eval/Treat Not Completed: (Consult received and chart reviewed.  Patient currently eating lunch.  Will re-attempt at later time/date as medically approrpriate.)   Sinahi Knights H. Owens Shark, PT, DPT, NCS 09/11/18, 12:00 PM [phone]

## 2018-09-11 NOTE — Progress Notes (Signed)
[facility] [place] NAME: [name]    MR#:  [id number]  DATE OF BIRTH:  1932-01-12  SUBJECTIVE:  CHIEF COMPLAINT:   Chief Complaint  Patient presents with  . Fall   Febrile today.  REVIEW OF SYSTEMS:    Review of Systems  Constitutional: Positive for malaise/fatigue. Negative for chills and fever.  HENT: Negative for sore throat.   Eyes: Negative for blurred vision, double vision and pain.  Respiratory: Negative for cough, hemoptysis, shortness of breath and wheezing.   Cardiovascular: Negative for chest pain, palpitations, orthopnea and leg swelling.  Gastrointestinal: Positive for constipation. Negative for abdominal pain, diarrhea, heartburn, nausea and vomiting.  Genitourinary: Negative for dysuria and hematuria.  Musculoskeletal: Positive for joint pain. Negative for back pain.  Skin: Negative for rash.  Neurological: Negative for sensory change, speech change, focal weakness and headaches.  Endo/Heme/Allergies: Does not bruise/bleed easily.  Psychiatric/Behavioral: Negative for depression. The patient is not nervous/anxious.     DRUG ALLERGIES:   Allergies  Allergen Reactions  . Contrast Media [Iodinated Diagnostic Agents] Shortness Of Breath  . Morphine And Related Other (See Comments)    Hallucinations   . Niacin And Related Dermatitis  . Other     Other reaction(s): SHORTNESS OF BREATH  . Amlodipine Rash    VITALS:  Blood pressure 128/65, pulse 78, temperature 98.6 F (37 C), temperature source Oral, resp. rate 18, height 5\' 6"  (1.676 m), weight 84.8 kg, SpO2 93 %.  PHYSICAL EXAMINATION:   Physical Exam  GENERAL:  85 y.o.-year-old patient lying in the bed with no acute distress.  Obese EYES: Pupils equal, round, reactive to light and accommodation. No scleral icterus. Extraocular muscles intact.  HEENT: Head atraumatic, normocephalic. Oropharynx and nasopharynx clear.  NECK:  Supple, no jugular venous  distention. No thyroid enlargement, no tenderness.  LUNGS: Normal breath sounds bilaterally, no wheezing, rales, rhonchi. No use of accessory muscles of respiration.  CARDIOVASCULAR: S1, S2 normal. No murmurs, rubs, or gallops.  ABDOMEN: Soft, nontender, nondistended. Bowel sounds present. No organomegaly or mass.  EXTREMITIES:  Bilateral lower extremity edema. left foot dressing NEUROLOGIC: Cranial nerves II through XII are intact. No focal Motor or sensory deficits b/l.   PSYCHIATRIC: The patient is alert and oriented x 3.  SKIN: No obvious rash, lesion, or ulcer.   LABORATORY PANEL:   CBC Recent Labs  Lab 09/11/18 0305  WBC 12.2*  HGB 12.7*  HCT 40.1  PLT 245   ------------------------------------------------------------------------------------------------------------------ Chemistries  Recent Labs  Lab 09/11/18 0305  NA 143  K 4.3  CL 108  CO2 27  GLUCOSE 147*  BUN 39*  CREATININE 1.58*  CALCIUM 8.9  MG 2.4   ------------------------------------------------------------------------------------------------------------------  Cardiac Enzymes No results for input(s): TROPONINI in the last 168 hours. ------------------------------------------------------------------------------------------------------------------  RADIOLOGY:  Dg Ribs Unilateral W/chest Right  Result Date: 09/10/2018 CLINICAL DATA:  Right anterior lower rib pain after fall EXAM: RIGHT RIBS AND CHEST - 3+ VIEW COMPARISON:  08/24/2018 chest radiograph. FINDINGS: Stable cardiomediastinal silhouette with mild cardiomegaly. No pneumothorax. No pleural effusion. Diffuse prominence of the central interstitial markings, slightly decreased from prior, favor mild pulmonary edema. No fracture or suspicious focal osseous lesion seen in the right ribs. IMPRESSION: 1. Cardiomegaly with diffuse prominence of the central interstitial markings, favor mild pulmonary edema, slightly improved from prior. 2. No right rib  fracture detected. Should the patient's symptoms persist or worsen, repeat radiographs of the ribs in 10 - 14 days  maybe of use to detect subtle nondisplaced rib fractures (which are commonly occult on initial imaging). Electronically Signed   By: Ilona Sorrel M.D.   On: 09/10/2018 12:06   Ct Head Wo Contrast  Result Date: 09/10/2018 CLINICAL DATA:  Fall EXAM: CT HEAD WITHOUT CONTRAST CT MAXILLOFACIAL WITHOUT CONTRAST CT CERVICAL SPINE WITHOUT CONTRAST TECHNIQUE: Multidetector CT imaging of the head, cervical spine, and maxillofacial structures were performed using the standard protocol without intravenous contrast. Multiplanar CT image reconstructions of the cervical spine and maxillofacial structures were also generated. COMPARISON:  CT head dated 08/11/2018 FINDINGS: Motion degraded images. CT HEAD FINDINGS Brain: No evidence of acute infarction, hemorrhage, hydrocephalus, extra-axial collection or mass lesion/mass effect. Encephalomalacic changes in the right occipital lobe, chronic. Subcortical white matter and periventricular small vessel ischemic changes. Vascular: Intracranial atherosclerosis. Skull: Normal. Negative for fracture or focal lesion. Other: None. CT MAXILLOFACIAL FINDINGS Osseous: No evidence of maxillofacial fracture. The mandible is intact. The bilateral mandibular condyles are well-seated in the TMJs. Orbits: The bilateral orbits, including the globes and retroconal soft tissues, are within normal limits. Sinuses: The visualized paranasal sinuses are essentially clear. The mastoid air cells are unopacified. Soft tissues: Negative. CT CERVICAL SPINE FINDINGS Alignment: Straightening of the normal upper cervical lordosis. Skull base and vertebrae: No acute fracture. No primary bone lesion or focal pathologic process. Soft tissues and spinal canal: No prevertebral fluid or swelling. No visible canal hematoma. Disc levels: Mild to moderate degenerative changes of the mid/lower cervical spine.  Spinal canal is patent. Upper chest: Visualized thyroid is grossly unremarkable. Other: None. IMPRESSION: Motion degraded images. No evidence of acute intracranial abnormality. 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 Small vessel ischemic changes. No evidence of maxillofacial fracture. No evidence of traumatic injury to the cervical spine. Mild to moderate degenerative changes. Electronically Signed   By: Julian Hy M.D.   On: 09/10/2018 11:52   Ct Cervical Spine Wo Contrast  Result Date: 09/10/2018 CLINICAL DATA:  Fall EXAM: CT HEAD WITHOUT CONTRAST CT MAXILLOFACIAL WITHOUT CONTRAST CT CERVICAL SPINE WITHOUT CONTRAST TECHNIQUE: Multidetector CT imaging of the head, cervical spine, and maxillofacial structures were performed using the standard protocol without intravenous contrast. Multiplanar CT image reconstructions of the cervical spine and maxillofacial structures were also generated. COMPARISON:  CT head dated 08/11/2018 FINDINGS: Motion degraded images. CT HEAD FINDINGS Brain: No evidence of acute infarction, hemorrhage, hydrocephalus, extra-axial collection or mass lesion/mass effect. Encephalomalacic changes in the right occipital lobe, chronic. Subcortical white matter and periventricular small vessel ischemic changes. Vascular: Intracranial atherosclerosis. Skull: Normal. Negative for fracture or focal lesion. Other: None. CT MAXILLOFACIAL FINDINGS Osseous: No evidence of maxillofacial fracture. The mandible is intact. The bilateral mandibular condyles are well-seated in the TMJs. Orbits: The bilateral orbits, including the globes and retroconal soft tissues, are within normal limits. Sinuses: The visualized paranasal sinuses are essentially clear. The mastoid air cells are unopacified. Soft tissues: Negative. CT CERVICAL SPINE FINDINGS Alignment: Straightening of the normal upper cervical lordosis. Skull base and vertebrae: No acute fracture. No primary bone lesion or focal pathologic process. Soft  tissues and spinal canal: No prevertebral fluid or swelling. No visible canal hematoma. Disc levels: Mild to moderate degenerative changes of the mid/lower cervical spine. Spinal canal is patent. Upper chest: Visualized thyroid is grossly unremarkable. Other: None. IMPRESSION: Motion degraded images. No evidence of acute intracranial abnormality. 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 Small vessel ischemic changes. No evidence of maxillofacial fracture. No evidence of traumatic injury to the cervical spine. Mild to  moderate degenerative changes. Electronically Signed   By: Julian Hy M.D.   On: 09/10/2018 11:52   Ct Maxillofacial Wo Contrast  Result Date: 09/10/2018 CLINICAL DATA:  Fall EXAM: CT HEAD WITHOUT CONTRAST CT MAXILLOFACIAL WITHOUT CONTRAST CT CERVICAL SPINE WITHOUT CONTRAST TECHNIQUE: Multidetector CT imaging of the head, cervical spine, and maxillofacial structures were performed using the standard protocol without intravenous contrast. Multiplanar CT image reconstructions of the cervical spine and maxillofacial structures were also generated. COMPARISON:  CT head dated 08/11/2018 FINDINGS: Motion degraded images. CT HEAD FINDINGS Brain: No evidence of acute infarction, hemorrhage, hydrocephalus, extra-axial collection or mass lesion/mass effect. Encephalomalacic changes in the right occipital lobe, chronic. Subcortical white matter and periventricular small vessel ischemic changes. Vascular: Intracranial atherosclerosis. Skull: Normal. Negative for fracture or focal lesion. Other: None. CT MAXILLOFACIAL FINDINGS Osseous: No evidence of maxillofacial fracture. The mandible is intact. The bilateral mandibular condyles are well-seated in the TMJs. Orbits: The bilateral orbits, including the globes and retroconal soft tissues, are within normal limits. Sinuses: The visualized paranasal sinuses are essentially clear. The mastoid air cells are unopacified. Soft tissues: Negative. CT CERVICAL SPINE  FINDINGS Alignment: Straightening of the normal upper cervical lordosis. Skull base and vertebrae: No acute fracture. No primary bone lesion or focal pathologic process. Soft tissues and spinal canal: No prevertebral fluid or swelling. No visible canal hematoma. Disc levels: Mild to moderate degenerative changes of the mid/lower cervical spine. Spinal canal is patent. Upper chest: Visualized thyroid is grossly unremarkable. Other: None. IMPRESSION: Motion degraded images. No evidence of acute intracranial abnormality. 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 Small vessel ischemic changes. No evidence of maxillofacial fracture. No evidence of traumatic injury to the cervical spine. Mild to moderate degenerative changes. Electronically Signed   By: Julian Hy M.D.   On: 09/10/2018 11:52     ASSESSMENT AND PLAN:   Patient is an 82 left male with history of chronic atrial fibrillation, chronic diastolic CHF hypertension, diabetes mellitus, COPD, hyperlipidemia and hypothyroidism who presented to the emergency room after mechanical fall at home.  Patient was diagnosed with sepsis secondary to urinary tract infection.  * Sepsis secondary to urinary tract infection. Cultures pending.  Continue IV antibiotics. Sepsis has resolved at this time. WBC trending down but still elevated today  *  Cellulitis of left lower extremity. Podiatry consulted.  Considering debridement.  Continue IV antibiotics.  *  Chronic atrial fibrillation Rate controlled.  Patient on anticoagulation with Eliquis.  Continue the same.  *  Hypothyroidism Continue Synthroid.  TSH level normal  *  Diabetes mellitus type 2 Continued home insulin regimen.  Placed on sliding scale insulin coverage.  *Acute kidney injury over CKD stage III. Proving  DVT prophylaxis; patient on Eliquis  All the records are reviewed and case discussed with Care Management/Social Worker Management plans discussed with the patient, family and  they are in agreement.  CODE STATUS: Full code  DVT Prophylaxis: SCDs  TOTAL TIME TAKING CARE OF THIS PATIENT: 35 minutes.   POSSIBLE D/C IN 1-2 DAYS, DEPENDING ON CLINICAL CONDITION.  [name] Elye Harmsen M.D on 09/11/2018 at 11:30 AM  Between 7am to 6pm - Pager - [phone]  After 6pm go to [email or web address] - password [email or web address] [facility]  Office  [phone]  CC: Primary care physician; Jearld Fenton, NP  Note: This dictation was prepared with Dragon dictation along with smaller phrase technology. Any transcriptional errors that result from this process are unintentional.

## 2018-09-11 NOTE — Plan of Care (Signed)
  Problem: Clinical Measurements: Goal: Ability to maintain clinical measurements within normal limits will improve Outcome: Progressing Goal: Will remain free from infection Outcome: Progressing Goal: Diagnostic test results will improve Outcome: Progressing Goal: Respiratory complications will improve Outcome: Progressing Goal: Cardiovascular complication will be avoided Outcome: Progressing   Problem: Pain Managment: Goal: General experience of comfort will improve Outcome: Progressing   Problem: Safety: Goal: Ability to remain free from injury will improve Outcome: Progressing   Problem: Skin Integrity: Goal: Risk for impaired skin integrity will decrease Outcome: Progressing

## 2018-09-11 NOTE — Progress Notes (Signed)
Inpatient Diabetes Program Recommendations  AACE/ADA: New Consensus Statement on Inpatient Glycemic Control   Target Ranges:  Prepandial:   less than 140 mg/dL      Peak postprandial:   less than 180 mg/dL (1-2 hours)      Critically ill patients:  140 - 180 mg/dL   Results for ARIQ, KHAMIS" (MRN [id number]) as of 09/11/2018 12:03  Ref. Range 09/10/2018 16:15 09/10/2018 23:06 09/11/2018 07:37 09/11/2018 08:01 09/11/2018 11:43  Glucose-Capillary Latest Ref Range: 70 - 99 mg/dL 136 (H) 206 (H) 62 (L) 89 171 (H)   Review of Glycemic Control  Diabetes history: DM2 Outpatient Diabetes medications: Levemir 42 units QHS, Huamlog 18 units with breakfast, 3 units with lunch, 10 units with supper Current orders for Inpatient glycemic control: Levemir 28 units QHS, Novolog 0-9 units TID with meals, Novolog 0-5 units QHS; Prednisone 10 mg QAM  Inpatient Diabetes Program Recommendations:   Insulin - Basal: Noted fasting glucose 62 mg/dl today. Please consider decreasing Levemir to 25 units QHS.  Thanks, Barnie Alderman, RN, MSN, CDE Diabetes Coordinator Inpatient Diabetes Program [phone] (Team Pager from 8am to 5pm)

## 2018-09-11 NOTE — Consult Note (Signed)
[facility]                                                      Patient Demographics  [name], is a 85 y.o. male   MRN: [id number]   DOB - 1932/03/18  Admit Date - 09/10/2018    Outpatient Primary MD for the patient is Jearld Fenton, NP  Consult requested in the Hospital by Saundra Shelling, MD, On 09/11/2018    Reason for consult diabetic foot ulcer   With History of -  Past Medical History:  Diagnosis Date  . Arthritis   . CAD    a. MI 01/29/1996 tx'd w/ TPA @ [place]; b. Myoview 06/2005: EF 50%, scar @ apex, mild peri-infarct ischemia  . Cancer ([employer])    skin  . Chronic atrial fibrillation    a. since 2006; b. on warfarin  . Chronic diastolic CHF (congestive heart failure) ([place])    a. echo 04/2006: EF lower limits of nl, mod LVH, mild aortic root dilatation, & mild MR, biatrial enlargement; b. echo 04/2013: EF 60%, mod dilated LA, mild MR & TR, mod pulm HTN w/ RV systolic pressure 53, c. echo 04/21/14: EF 55-60%, unable to exclude WMA, severely dilated LA 6.6 cm, nl RVSP, mildly dilated aortic root  . COPD (chronic obstructive pulmonary disease) (HCC)    oxygen prn at home  . CVA 9833,8250   x2  . DM   . Falls   . GERD (gastroesophageal reflux disease)   . History of kidney stones   . HYPERLIPIDEMIA   . HYPERTENSION   . Kidney stone    a. s/p left ureteral stenting 04/24/14  . Left arm weakness    limited movement. S/P fall injury  . Neuropathy of both feet   . Poor balance   . Wears dentures    full upper and lower      Past Surgical History:  Procedure Laterality Date  . AMPUTATION TOE Left 06/13/2018   Procedure: 1st Ray Resection Left;  Surgeon: Sharlotte Alamo, DPM;  Location: [facility];  Service: Podiatry;  Laterality: Left;  . BLADDER SURGERY     stent placement   . CARDIAC CATHETERIZATION  1997   [facility]  . CAROTID STENT INSERTION   1997  . CATARACT EXTRACTION W/PHACO Left 10/11/2017   Procedure: CATARACT EXTRACTION PHACO AND INTRAOCULAR LENS PLACEMENT ([place]) COMPLICATED LEFT DIABETIC;  Surgeon: Leandrew Koyanagi, MD;  Location: [place];  Service: Ophthalmology;  Laterality: Left;  MALYUGIN Diabetic - insulin  . CATARACT EXTRACTION W/PHACO Right 11/30/2017   Procedure: CATARACT EXTRACTION PHACO AND INTRAOCULAR LENS PLACEMENT ([place]) COMPLICATED  RIGHT DIABETIC;  Surgeon: Leandrew Koyanagi, MD;  Location: [place];  Service: Ophthalmology;  Laterality: Right;  Diabetic - insulin  . CIRCUMCISION  2016  . CORONARY ANGIOPLASTY  1997   s/p stent placement x 2   . CYSTOSCOPY W/ URETERAL STENT REMOVAL Left 10/09/2014   Procedure: CYSTOSCOPY WITH STENT REMOVAL;  Surgeon: Hollice Espy, MD;  Location: [facility];  Service: Urology;  Laterality: Left;  . CYSTOSCOPY WITH STENT PLACEMENT Left 10/09/2014   Procedure: CYSTOSCOPY WITH STENT PLACEMENT;  Surgeon: Hollice Espy, MD;  Location: [facility];  Service: Urology;  Laterality: Left;  . KIDNEY SURGERY  05/2013   s/p stent placement   . LOWER EXTREMITY ANGIOGRAPHY  Left 06/12/2018   Procedure: Lower Extremity Angiography;  Surgeon: Algernon Huxley, MD;  Location: [place] [facility];  Service: Cardiovascular;  Laterality: Left;  . stents ureters Bilateral   . TONSILLECTOMY AND ADENOIDECTOMY  1959  . URETEROSCOPY WITH HOLMIUM LASER LITHOTRIPSY Left 10/09/2014   Procedure: URETEROSCOPY WITH HOLMIUM LASER LITHOTRIPSY;  Surgeon: Hollice Espy, MD;  Location: [facility];  Service: Urology;  Laterality: Left;    in for   Chief Complaint  Patient presents with  . Fall     HPI  [name]  is a 85 y.o. male, history of ulcerations on the left foot including first ray amputation by Dr. Sharlotte Alamo with slow healing to the region.  Been planning on a second procedure on the area but he developed pneumonia and had to be treated last week for that.    Review of  Systems    In addition to the HPI above,  No Fever-chills, No Headache, No changes with Vision or hearing, No problems swallowing food or Liquids, No Chest pain, Cough or Shortness of Breath, No Abdominal pain, No Nausea or Vommitting, Bowel movements are regular, No Blood in stool or Urine, No dysuria, No new skin rashes or bruises, No new joints pains-aches,  No new weakness, tingling, numbness in any extremity, No recent weight gain or loss, No polyuria, polydypsia or polyphagia, No significant Mental Stressors.  A full 10 point Review of Systems was done, except as stated above, all other Review of Systems were negative.   Social History Social History   Tobacco Use  . Smoking status: Former Smoker    Packs/day: 2.00    Years: 40.00    Pack years: 80.00    Types: Cigarettes    Last attempt to quit: 05/24/1990    Years since quitting: 28.3  . Smokeless tobacco: Former Systems developer    Quit date: 05/24/1990  Substance Use Topics  . Alcohol use: No    Family History Family History  Problem Relation Age of Onset  . Heart disease Mother   . Heart disease Maternal Grandmother   . Diabetes Maternal Grandmother   . Cancer Neg Hx   . Stroke Neg Hx     Prior to Admission medications   Medication Sig Start Date End Date Taking? Authorizing Provider  ALPRAZolam (XANAX) 0.25 MG tablet Take 1 tablet (0.25 mg total) by mouth at bedtime as needed for anxiety. 07/19/18  Yes Baity, Coralie Keens, NP  apixaban (ELIQUIS) 2.5 MG TABS tablet Take 1 tablet (2.5 mg total) by mouth 2 (two) times daily. 03/27/18  Yes Minna Merritts, MD  atorvastatin (LIPITOR) 40 MG tablet TAKE 1 TABLET DAILY Patient taking differently: Take 40 mg by mouth daily at 6 PM.  07/17/18  Yes Baity, Coralie Keens, NP  citalopram (CELEXA) 10 MG tablet TAKE 1 TABLET DAILY 07/18/18  Yes Baity, Coralie Keens, NP  COMBIGAN 0.2-0.5 % ophthalmic solution Place 1 drop into both eyes daily. 06/26/18  Yes [provider]  dorzolamide  (TRUSOPT) 2 % ophthalmic solution Place 1 drop into both eyes 3 (three) times daily.  03/31/16  Yes [provider]  ezetimibe (ZETIA) 10 MG tablet TAKE 1 TABLET DAILY 07/17/18  Yes Baity, Coralie Keens, NP  finasteride (PROSCAR) 5 MG tablet TAKE 1 TABLET DAILY Patient taking differently: Take 5 mg by mouth daily.  06/29/17  Yes Baity, Coralie Keens, NP  gabapentin (NEURONTIN) 100 MG capsule TAKE 1 CAPSULE THREE TIMES A DAY Patient taking differently: Take  100 mg by mouth at bedtime.  06/09/18  Yes Baity, Coralie Keens, NP  insulin detemir (LEVEMIR) 100 UNIT/ML injection Inject 0.28 mLs (28 Units total) into the skin daily at 10 pm. Patient taking differently: Inject 42 Units into the skin daily at 10 pm.  06/15/18  Yes Fritzi Mandes, MD  insulin lispro (HUMALOG) 100 UNIT/ML injection Inject 0.04 mLs (4 Units total) into the skin 3 (three) times daily with meals. Patient taking differently: Inject 10-18 Units into the skin 3 (three) times daily with meals. 18 am, 3 lunch, 10 supper 06/15/18  Yes Fritzi Mandes, MD  latanoprost (XALATAN) 0.005 % ophthalmic solution Place 1 drop into both eyes at bedtime.  04/18/16  Yes [provider]  levothyroxine (SYNTHROID, LEVOTHROID) 50 MCG tablet TAKE 1 TABLET DAILY BEFORE BREAKFAST 06/27/18  Yes Baity, Coralie Keens, NP  metoprolol succinate (TOPROL-XL) 50 MG 24 hr tablet TAKE 1 TABLET TWICE A DAY 06/09/18  Yes Baity, Coralie Keens, NP  montelukast (SINGULAIR) 10 MG tablet TAKE 1 TABLET AT BEDTIME Patient taking differently: Take 10 mg by mouth at bedtime.  02/24/18  Yes Baity, Coralie Keens, NP  potassium chloride SA (K-DUR,KLOR-CON) 20 MEQ tablet Take 4 tablets (80 meq) by mouth twice daily, take an extra 1 tablet (20 meq) on the days you take metolazone Patient taking differently: Take 80 mEq by mouth 2 (two) times daily. Take 4 tablets (80 meq) by mouth twice daily at breakfast and at lunch, take an extra 1 tablet (20 meq) on the days you take metolazone put tablet in applesauce to  aid swallowing 02/10/18  Yes Gollan, Kathlene November, MD  predniSONE (DELTASONE) 10 MG tablet Take 1 tablet (10 mg total) by mouth daily with breakfast. 05/05/18  Yes Baity, Coralie Keens, NP  sulfamethoxazole-trimethoprim (BACTRIM DS) 800-160 MG tablet Take 1 tablet by mouth 2 (two) times daily. 09/06/18  Yes Baity, Coralie Keens, NP  SYMBICORT 160-4.5 MCG/ACT inhaler USE 2 INHALATIONS TWICE A DAY Patient taking differently: Inhale 1 puff into the lungs 2 (two) times daily.  08/31/17  Yes Wilhelmina Mcardle, MD  tamsulosin (FLOMAX) 0.4 MG CAPS capsule TAKE 1 CAPSULE DAILY 07/18/18  Yes Jearld Fenton, NP  torsemide (DEMADEX) 20 MG tablet Take 40 mg by mouth 2 (two) times daily.   Yes [provider]  umeclidinium bromide (INCRUSE ELLIPTA) 62.5 MCG/INH AEPB Inhale 1 puff into the lungs daily.   Yes [provider]  acetaminophen (TYLENOL) 650 MG CR tablet Take 650 mg by mouth every 8 (eight) hours as needed for pain.     [provider]  albuterol (PROAIR HFA) 108 (90 Base) MCG/ACT inhaler USE 1 TO 2 INHALATIONS EVERY 4 HOURS AS NEEDED Patient taking differently: Inhale 1-2 puffs into the lungs every 4 (four) hours as needed for wheezing or shortness of breath.  11/09/17   Wilhelmina Mcardle, MD  clotrimazole (LOTRIMIN) 1 % cream Apply 1 application topically 2 (two) times daily. 11/18/17   Bedsole, Amy E, MD  COLCRYS 0.6 MG tablet TAKE 1 TABLET TWICE A DAY AS NEEDED (ACUTE GOUT FLARE) USE AS DIRECTED 07/06/18   Jearld Fenton, NP  diclofenac sodium (VOLTAREN) 1 % GEL Apply 4 g topically 4 (four) times daily. 01/05/18   Copland, Frederico Hamman, MD  Insulin Pen Needle (BD PEN NEEDLE NANO U/F) 32G X 4 MM MISC USE THREE TIMES A DAY FOR INSULIN ADMINISTRATION 02/15/18   Jearld Fenton, NP  Insulin Syringe-Needle U-100 30G X 1/2" 1 ML  MISC 1 each by Does not apply route 3 (three) times daily. 07/19/14   Jearld Fenton, NP  metolazone (ZAROXOLYN) 5 MG tablet Take 1 tablet (5 mg total) by mouth daily as needed  (swelling). 03/13/18   Minna Merritts, MD  nitroGLYCERIN (NITROSTAT) 0.4 MG SL tablet Place 0.4 mg under the tongue every 5 (five) minutes as needed.      [provider]  nystatin-triamcinolone (MYCOLOG II) cream Apply 1 application topically 2 (two) times daily.  03/12/14   [provider]  traMADol (ULTRAM) 50 MG tablet Take 1 tablet (50 mg total) by mouth 2 (two) times daily. 06/29/18   Jearld Fenton, NP  traZODone (DESYREL) 50 MG tablet Take 2 tablets (100 mg total) by mouth at bedtime as needed for sleep. 02/16/18   Jearld Fenton, NP    Anti-infectives (From admission, onward)   Start     Dose/Rate Route Frequency Ordered Stop   09/11/18 1800  vancomycin (VANCOCIN) 1,250 mg in sodium chloride 0.9 % 250 mL IVPB     1,250 mg 166.7 mL/hr over 90 Minutes Intravenous Every 48 hours 09/10/18 1624     09/10/18 2200  ceFEPIme (MAXIPIME) 2 g in sodium chloride 0.9 % 100 mL IVPB     2 g 200 mL/hr over 30 Minutes Intravenous Every 12 hours 09/10/18 1624     09/10/18 1545  vancomycin (VANCOCIN) 1,750 mg in sodium chloride 0.9 % 500 mL IVPB     1,750 mg 250 mL/hr over 120 Minutes Intravenous  Once 09/10/18 1532 09/10/18 1905   09/10/18 1415  vancomycin (VANCOCIN) IVPB 1000 mg/200 mL premix  Status:  Discontinued     1,000 mg 200 mL/hr over 60 Minutes Intravenous  Once 09/10/18 1413 09/10/18 1532   09/10/18 1415  cefTRIAXone (ROCEPHIN) 2 g in sodium chloride 0.9 % 100 mL IVPB     2 g 200 mL/hr over 30 Minutes Intravenous  Once 09/10/18 1413 09/10/18 1543   09/10/18 1400  cefTRIAXone (ROCEPHIN) 1 g in sodium chloride 0.9 % 100 mL IVPB  Status:  Discontinued     1 g 200 mL/hr over 30 Minutes Intravenous Every 24 hours 09/10/18 1358 09/10/18 1413      Scheduled Meds: . apixaban  2.5 mg Oral BID  . atorvastatin  40 mg Oral Daily  . brimonidine  1 drop Both Eyes Q2000   And  . timolol  1 drop Both Eyes Q2000  . citalopram  10 mg Oral Daily  . clotrimazole  1 application  Topical BID  . colchicine  0.6 mg Oral Daily  . diclofenac sodium  4 g Topical QID  . dorzolamide  1 drop Both Eyes TID  . ezetimibe  10 mg Oral Daily  . finasteride  5 mg Oral Daily  . gabapentin  100 mg Oral TID  . insulin aspart  0-5 Units Subcutaneous QHS  . insulin aspart  0-9 Units Subcutaneous TID WC  . insulin detemir  28 Units Subcutaneous Q2200  . latanoprost  1 drop Both Eyes QHS  . levothyroxine  50 mcg Oral QAC breakfast  . metoprolol succinate  50 mg Oral BID  . mometasone-formoterol  2 puff Inhalation BID  . montelukast  10 mg Oral QHS  . nystatin-triamcinolone  1 application Topical BID  . predniSONE  10 mg Oral Q breakfast  . tamsulosin  0.4 mg Oral Daily  . traMADol  50 mg Oral BID  . umeclidinium bromide  1 puff  Inhalation Daily   Continuous Infusions: . sodium chloride 100 mL/hr at 09/11/18 0400  . ceFEPime (MAXIPIME) IV Stopped (09/11/18 0033)  . vancomycin     PRN Meds:.acetaminophen, albuterol, ALPRAZolam, nitroGLYCERIN, traZODone  Allergies  Allergen Reactions  . Contrast Media [Iodinated Diagnostic Agents] Shortness Of Breath  . Morphine And Related Other (See Comments)    Hallucinations   . Niacin And Related Dermatitis  . Other     Other reaction(s): SHORTNESS OF BREATH  . Amlodipine Rash    Physical Exam  Vitals  Blood pressure 130/70, pulse 77, temperature 97.6 F (36.4 C), temperature source Oral, resp. rate 18, height 5\' 6"  (1.676 m), weight 84.8 kg, SpO2 97 %.  Lower Extremity exam:  Vascular: Difficult to palpate bilateral.  He is under the care of Dr. Corene Cornea dew for vascular disease  Dermatological: Small pressure ulcer on the heel has a eschar over that does not look to be infected.  First ray amputation area shows wound approximately 2 and half centimeters in length 1.5 cm in width with about 1/2 cm in depth.  There is some bone exposed part of the first metatarsal in the region the area does not appear to be infected there is no  evidence of purulent drainage from the area.  Neurological: Diabetic peripheral neuropathy  Ortho: Patient dates he is somewhat ambulatory.  Although I do not think he gets up very much.  Data Review  CBC Recent Labs  Lab 09/10/18 1041 09/11/18 0305  WBC 17.1* 12.2*  HGB 13.2 12.7*  HCT 40.9 40.1  PLT 278 245  MCV 90.7 91.6  MCH 29.3 29.0  MCHC 32.3 31.7  RDW 17.2* 17.2*  LYMPHSABS 3.6  --   MONOABS 1.9*  --   EOSABS 0.1  --   BASOSABS 0.1  --    ------------------------------------------------------------------------------------------------------------------  Chemistries  Recent Labs  Lab 09/10/18 1041 09/11/18 0305  NA 138 143  K 4.5 4.3  CL 102 108  CO2 26 27  GLUCOSE 98 147*  BUN 40* 39*  CREATININE 1.82* 1.58*  CALCIUM 8.9 8.9  MG  --  2.4   ------------------------------------------------------------------------------------------------------------------ estimated creatinine clearance is 35.5 mL/min (A) (by C-G formula based on SCr of 1.58 mg/dL (H)). ------------------------------------------------------------------------------------------------------------------ Recent Labs    09/11/18 0305  TSH 5.036*   Urinalysis    Component Value Date/Time   COLORURINE YELLOW (A) 09/10/2018 1112   APPEARANCEUR HAZY (A) 09/10/2018 1112   APPEARANCEUR Cloudy (A) 09/20/2014 1440   LABSPEC 1.009 09/10/2018 1112   LABSPEC 1.010 08/05/2014 2030   PHURINE 5.0 09/10/2018 1112   GLUCOSEU NEGATIVE 09/10/2018 1112   GLUCOSEU 50 mg/dL 08/05/2014 2030   GLUCOSEU NEGATIVE 02/14/2014 1257   HGBUR NEGATIVE 09/10/2018 1112   [place] 09/10/2018 1112   BILIRUBINUR neg 08/22/2018 1001   BILIRUBINUR Negative 09/20/2014 1440   BILIRUBINUR Negative 08/05/2014 2030   KETONESUR NEGATIVE 09/10/2018 1112   PROTEINUR NEGATIVE 09/10/2018 1112   UROBILINOGEN 0.2 08/22/2018 1001   UROBILINOGEN 0.2 02/14/2014 1257   NITRITE NEGATIVE 09/10/2018 1112   LEUKOCYTESUR LARGE  (A) 09/10/2018 1112   LEUKOCYTESUR 3+ 08/05/2014 2030    Assessment & Plan: No evidence of infection.  I think there is an immediate need for debridement of this but I will speak with Dr. Caryl Comes and see if he wants to consider doing something on those lines while he is in the hospital.  Recommend continuing use of heavy padding to the heels bilaterally.  Active Problems:   Sepsis (  Door)   Pressure injury of skin   Family Communication: Plan discussed with patient and **  Albertine Patricia M.D on 09/11/2018 at 6:53 AM  Thank you for the consult, we will follow the patient with you in the Hospital.

## 2018-09-11 NOTE — Progress Notes (Signed)
PT Cancellation Note  Patient Details Name: [name]. MRN: [id number] DOB: Oct 28, 1931   Cancelled Treatment:    Reason Eval/Treat Not Completed: Medical issues which prohibited therapy(Evaluation re-attempted.  Upon arrival to room, patient with visible SOB, abdominal breathing.  Sats 88-92% on RA.  Repositioned to comfort (more upright), discussed with RN, requested breathing treatment per respiratory.  Additional activity deferred at this time as result; respiratory at bedside end of treatment session.  Will continue efforts next date.)   Delawrence Fridman H. Owens Shark, PT, DPT, NCS 09/11/18, 3:58 PM [phone]

## 2018-09-11 NOTE — Consult Note (Signed)
[facility] Vascular Consult Note  MRN : [id number]  [name]. is a 85 y.o. (Jul 12, 1931) male who presents with chief complaint of  Chief Complaint  Patient presents with  . Fall   History of Present Illness:  The patient is an 85 year old male with past medical history of type 2 diabetes, hyperlipidemia, mitral regurgitation, hypertension, coronary artery disease status post MI, chronic atrial fibrillation on Eliquis, CVA in the past, chronic diastolic congestive heart failure, BPH, COPD, hypothyroidism presented to the [facility] emergency department for evaluation after a fall.  The patient is known to our practice and has been treated for atherosclerotic disease with ulceration the left lower extremity by Dr. Lucky Cowboy. On 06/12/18, the patient underwent: 1. Ultrasound guidance for vascular access right femoral artery 2. Catheter placement into left SFA from right femoral approach 3. Aortogram and selective left lower extremity angiogram 4. Percutaneous transluminal angioplasty of the left anterior tibial artery in the proximal and mid segments with a 3 mm diameter by 22 cm length angioplasty balloon and at the origin with a 4 mm diameter by 4 cm length Lutonix drug-coated angioplasty balloon 5.  Percutaneous transluminal angioplasty of the left popliteal artery in the left SFA with a 5 mm diameter by 22 cm length Lutonix drug-coated angioplasty balloon and a 5 mm diameter by 15 cm length Lutonix drug-coated angioplasty balloon             6.  Viabahn stent placement to the mid to distal SFA with a 6 mm diameter by 15 cm length stent for greater than 50% residual stenosis in this location 7. StarClose closure device right femoral artery  The patient states that he had an appointment with Dr. Lucky Cowboy today in follow-up of his peripheral artery disease.  Patient notes  progressively worsening discomfort to the left foot.  He does not ambulate much.  Denies any claudication-like symptoms or rest pain.  Patient notes slow healing wounds to the left foot.  He denies any fever, nausea vomiting.  Denies any shortness of breath or chest pain.  Vascular surgery was consulted by Dr. Elvina Mattes for further recommendations. Current Facility-Administered Medications  Medication Dose Route Frequency Provider Last Rate Last Dose  . 0.9 %  sodium chloride infusion   Intravenous Continuous Stark Jock, Jude, MD 100 mL/hr at 09/11/18 0941    . acetaminophen (TYLENOL) tablet 650 mg  650 mg Oral Q8H PRN Ojie, Jude, MD      . albuterol (PROVENTIL) (2.5 MG/3ML) 0.083% nebulizer solution 2.5 mg  2.5 mg Inhalation Q4H PRN Ojie, Jude, MD      . ALPRAZolam Duanne Moron) tablet 0.25 mg  0.25 mg Oral QHS PRN Ojie, Jude, MD      . apixaban (ELIQUIS) tablet 2.5 mg  2.5 mg Oral BID Stark Jock, Jude, MD   2.5 mg at 09/11/18 5885  . atorvastatin (LIPITOR) tablet 40 mg  40 mg Oral Daily Ojie, Jude, MD   40 mg at 09/11/18 0807  . brimonidine (ALPHAGAN) 0.2 % ophthalmic solution 1 drop  1 drop Both Eyes Q2000 Ojie, Jude, MD   1 drop at 09/10/18 2010   And  . timolol (TIMOPTIC) 0.5 % ophthalmic solution 1 drop  1 drop Both Eyes Q2000 Ojie, Jude, MD   1 drop at 09/10/18 2010  . ceFEPIme (MAXIPIME) 2 g in sodium chloride 0.9 % 100 mL IVPB  2 g Intravenous Q12H Ojie, Jude, MD 200 mL/hr at 09/11/18 1129 2 g at  09/11/18 1129  . citalopram (CELEXA) tablet 10 mg  10 mg Oral Daily Stark Jock, Jude, MD   10 mg at 09/11/18 0807  . clotrimazole (LOTRIMIN) 1 % cream 1 application  1 application Topical BID Stark Jock, Jude, MD   1 application at 81/01/75 0811  . colchicine tablet 0.6 mg  0.6 mg Oral Daily Ojie, Jude, MD   0.6 mg at 09/11/18 0807  . diclofenac sodium (VOLTAREN) 1 % transdermal gel 4 g  4 g Topical QID Stark Jock, Jude, MD   4 g at 09/11/18 0808  . dorzolamide (TRUSOPT) 2 % ophthalmic solution 1 drop  1 drop Both Eyes TID Stark Jock,  Jude, MD   1 drop at 09/11/18 0812  . ezetimibe (ZETIA) tablet 10 mg  10 mg Oral Daily Ojie, Jude, MD   10 mg at 09/11/18 0807  . finasteride (PROSCAR) tablet 5 mg  5 mg Oral Daily Ojie, Jude, MD   5 mg at 09/11/18 0806  . gabapentin (NEURONTIN) capsule 100 mg  100 mg Oral TID Stark Jock, Jude, MD   100 mg at 09/11/18 0807  . insulin aspart (novoLOG) injection 0-5 Units  0-5 Units Subcutaneous QHS Stark Jock, Jude, MD   2 Units at 09/10/18 2353  . insulin aspart (novoLOG) injection 0-9 Units  0-9 Units Subcutaneous TID WC Stark Jock, Jude, MD   2 Units at 09/11/18 1229  . insulin detemir (LEVEMIR) injection 28 Units  28 Units Subcutaneous Q2200 Stark Jock, Jude, MD   28 Units at 09/10/18 2355  . latanoprost (XALATAN) 0.005 % ophthalmic solution 1 drop  1 drop Both Eyes QHS Ojie, Jude, MD   1 drop at 09/11/18 0016  . levothyroxine (SYNTHROID) tablet 50 mcg  50 mcg Oral QAC breakfast Stark Jock, Jude, MD   50 mcg at 09/11/18 0607  . metoprolol succinate (TOPROL-XL) 24 hr tablet 50 mg  50 mg Oral BID Stark Jock, Jude, MD   50 mg at 09/11/18 0807  . mometasone-formoterol (DULERA) 200-5 MCG/ACT inhaler 2 puff  2 puff Inhalation BID Stark Jock, Jude, MD   2 puff at 09/11/18 0940  . montelukast (SINGULAIR) tablet 10 mg  10 mg Oral QHS Ojie, Jude, MD      . nitroGLYCERIN (NITROSTAT) SL tablet 0.4 mg  0.4 mg Sublingual Q5 min PRN Ojie, Jude, MD      . nystatin-triamcinolone (MYCOLOG II) cream 1 application  1 application Topical BID Stark Jock, Jude, MD   1 application at 02/04/84 0812  . predniSONE (DELTASONE) tablet 10 mg  10 mg Oral Q breakfast Stark Jock, Jude, MD   10 mg at 09/11/18 0807  . tamsulosin (FLOMAX) capsule 0.4 mg  0.4 mg Oral Daily Ojie, Jude, MD   0.4 mg at 09/11/18 0807  . traMADol (ULTRAM) tablet 50 mg  50 mg Oral BID Stark Jock, Jude, MD   50 mg at 09/11/18 0807  . traZODone (DESYREL) tablet 100 mg  100 mg Oral QHS PRN Ojie, Jude, MD      . umeclidinium bromide (INCRUSE ELLIPTA) 62.5 MCG/INH 1 puff  1 puff Inhalation Daily Ojie, Jude, MD   1  puff at 09/11/18 0810  . vancomycin (VANCOCIN) 1,250 mg in sodium chloride 0.9 % 250 mL IVPB  1,250 mg Intravenous Q48H Hillary Bow, MD       Past Medical History:  Diagnosis Date  . Arthritis   . CAD    a. MI 01/29/1996 tx'd w/ TPA @ [facility]; b. Myoview 06/2005: EF 50%, scar @ apex, mild peri-infarct ischemia  . Cancer ([place])  skin  . Chronic atrial fibrillation    a. since 2006; b. on warfarin  . Chronic diastolic CHF (congestive heart failure) ([place])    a. echo 04/2006: EF lower limits of nl, mod LVH, mild aortic root dilatation, & mild MR, biatrial enlargement; b. echo 04/2013: EF 60%, mod dilated LA, mild MR & TR, mod pulm HTN w/ RV systolic pressure 53, c. echo 04/21/14: EF 55-60%, unable to exclude WMA, severely dilated LA 6.6 cm, nl RVSP, mildly dilated aortic root  . COPD (chronic obstructive pulmonary disease) ([facility])    oxygen prn at home  . CVA [phone]   x2  . DM   . Falls   . GERD (gastroesophageal reflux disease)   . History of kidney stones   . HYPERLIPIDEMIA   . HYPERTENSION   . Kidney stone    a. s/p left ureteral stenting 04/24/14  . Left arm weakness    limited movement. S/P fall injury  . Neuropathy of both feet   . Poor balance   . Wears dentures    full upper and lower   Past Surgical History:  Procedure Laterality Date  . AMPUTATION TOE Left 06/13/2018   Procedure: 1st Ray Resection Left;  Surgeon: Sharlotte Alamo, DPM;  Location: [facility];  Service: Podiatry;  Laterality: Left;  . BLADDER SURGERY     stent placement   . CARDIAC CATHETERIZATION  1997   [facility]  . CAROTID STENT INSERTION  1997  . CATARACT EXTRACTION W/PHACO Left 10/11/2017   Procedure: CATARACT EXTRACTION PHACO AND INTRAOCULAR LENS PLACEMENT ([place]) COMPLICATED LEFT DIABETIC;  Surgeon: Leandrew Koyanagi, MD;  Location: [facility];  Service: Ophthalmology;  Laterality: Left;  MALYUGIN Diabetic - insulin  . CATARACT EXTRACTION W/PHACO Right 11/30/2017   Procedure: CATARACT EXTRACTION PHACO  AND INTRAOCULAR LENS PLACEMENT ([place]) COMPLICATED  RIGHT DIABETIC;  Surgeon: Leandrew Koyanagi, MD;  Location: [facility];  Service: Ophthalmology;  Laterality: Right;  Diabetic - insulin  . CIRCUMCISION  2016  . CORONARY ANGIOPLASTY  1997   s/p stent placement x 2   . CYSTOSCOPY W/ URETERAL STENT REMOVAL Left 10/09/2014   Procedure: CYSTOSCOPY WITH STENT REMOVAL;  Surgeon: Hollice Espy, MD;  Location: [facility];  Service: Urology;  Laterality: Left;  . CYSTOSCOPY WITH STENT PLACEMENT Left 10/09/2014   Procedure: CYSTOSCOPY WITH STENT PLACEMENT;  Surgeon: Hollice Espy, MD;  Location: [facility];  Service: Urology;  Laterality: Left;  . KIDNEY SURGERY  05/2013   s/p stent placement   . LOWER EXTREMITY ANGIOGRAPHY Left 06/12/2018   Procedure: Lower Extremity Angiography;  Surgeon: Algernon Huxley, MD;  Location: [facility];  Service: Cardiovascular;  Laterality: Left;  . stents ureters Bilateral   . TONSILLECTOMY AND ADENOIDECTOMY  1959  . URETEROSCOPY WITH HOLMIUM LASER LITHOTRIPSY Left 10/09/2014   Procedure: URETEROSCOPY WITH HOLMIUM LASER LITHOTRIPSY;  Surgeon: Hollice Espy, MD;  Location: [facility];  Service: Urology;  Laterality: Left;   Social History Social History   Tobacco Use  . Smoking status: Former Smoker    Packs/day: 2.00    Years: 40.00    Pack years: 80.00    Types: Cigarettes    Last attempt to quit: 05/24/1990    Years since quitting: 28.3  . Smokeless tobacco: Former Systems developer    Quit date: 05/24/1990  Substance Use Topics  . Alcohol use: No  . Drug use: No   Family History Family History  Problem Relation Age of Onset  . Heart disease Mother   .  Heart disease Maternal Grandmother   . Diabetes Maternal Grandmother   . Cancer Neg Hx   . Stroke Neg Hx   Denies family history of peripheral artery disease, venous disease and/or renal disease.  Allergies  Allergen Reactions  . Contrast Media [Iodinated Diagnostic Agents] Shortness Of Breath  .  Morphine And Related Other (See Comments)    Hallucinations   . Niacin And Related Dermatitis  . Other     Other reaction(s): SHORTNESS OF BREATH  . Amlodipine Rash   REVIEW OF SYSTEMS (Negative unless checked)  Constitutional: [] Weight loss  [] Fever  [] Chills Cardiac: [] Chest pain   [] Chest pressure   [] Palpitations   [] Shortness of breath when laying flat   [] Shortness of breath at rest   [x] Shortness of breath with exertion. Vascular:  [] Pain in legs with walking   [] Pain in legs at rest   [] Pain in legs when laying flat   [] Claudication   [x] Pain in feet when walking  [] Pain in feet at rest  [x] Pain in feet when laying flat   [] History of DVT   [] Phlebitis   [x] Swelling in legs   [] Varicose veins   [x] Non-healing ulcers Pulmonary:   [] Uses home oxygen   [] Productive cough   [] Hemoptysis   [] Wheeze  [] COPD   [] Asthma Neurologic:  [] Dizziness  [] Blackouts   [] Seizures   [] History of stroke   [] History of TIA  [] Aphasia   [] Temporary blindness   [] Dysphagia   [] Weakness or numbness in arms   [] Weakness or numbness in legs Musculoskeletal:  [] Arthritis   [] Joint swelling   [] Joint pain   [] Low back pain Hematologic:  [] Easy bruising  [] Easy bleeding   [] Hypercoagulable state   [] Anemic  [] Hepatitis Gastrointestinal:  [] Blood in stool   [] Vomiting blood  [] Gastroesophageal reflux/heartburn   [] Difficulty swallowing. Genitourinary:  [] Chronic kidney disease   [] Difficult urination  [] Frequent urination  [] Burning with urination   [] Blood in urine Skin:  [] Rashes   [x] Ulcers   [x] Wounds Psychological:  [] History of anxiety   []  History of major depression.  Physical Examination  Vitals:   09/10/18 2129 09/10/18 2303 09/11/18 0347 09/11/18 0734  BP: 125/71 (!) 146/95 130/70 128/65  Pulse: 79 65 77 78  Resp: 20 20 18 18   Temp: 98 F (36.7 C) 97.7 F (36.5 C) 97.6 F (36.4 C) 98.6 F (37 C)  TempSrc: Oral Oral Oral Oral  SpO2: 97% 98% 97% 93%  Weight:      Height:       Body mass  index is 30.18 kg/m. Gen:  WD/WN, NAD Head: [place]/AT, No temporalis wasting. Prominent temp pulse not noted. Ear/Nose/Throat: Hearing grossly intact, nares w/o erythema or drainage, oropharynx w/o Erythema/Exudate Eyes: Sclera non-icteric, conjunctiva clear Neck: Trachea midline.  No JVD.  Pulmonary:  Good air movement, respirations not labored, equal bilaterally.  Cardiac: Irregularly irregular Vascular:  Vessel Right Left  Radial Palpable Palpable  Ulnar Palpable Palpable  Brachial Palpable Palpable  Carotid Palpable, without bruit Palpable, without bruit  Aorta Not palpable N/A  Femoral Palpable Palpable  Popliteal Palpable Palpable  PT Non-Palpable Non-Palpable  DP Non-Palpable Non-Palpable   Left Lower Extremity: Severe stasis dermatis noted. Moderate edema. Small eschar to heel. Amputation site breakdown of the first left ray.   Right Lower Extremity: Severe stasis dermatis noted. Moderate edema. No wounds.   Gastrointestinal: soft, non-tender/non-distended. No guarding/reflex.  Musculoskeletal: M/S 5/5 throughout.Marland Kitchen Neurologic: Sensation grossly intact in extremities.  Symmetrical.  Speech is fluent. Motor exam as listed  above. Psychiatric: Judgment intact, Mood & affect appropriate for pt's clinical situation. Dermatologic: As above Lymph : No Cervical, Axillary, or Inguinal lymphadenopathy.  CBC Lab Results  Component Value Date   WBC 12.2 (H) 09/11/2018   HGB 12.7 (L) 09/11/2018   HCT 40.1 09/11/2018   MCV 91.6 09/11/2018   PLT 245 09/11/2018   BMET    Component Value Date/Time   NA 143 09/11/2018 0305   NA 143 02/21/2015 1108   NA 137 08/05/2014 1839   K 4.3 09/11/2018 0305   K 3.6 08/05/2014 1839   CL 108 09/11/2018 0305   CL 106 08/05/2014 1839   CO2 27 09/11/2018 0305   CO2 23 08/05/2014 1839   GLUCOSE 147 (H) 09/11/2018 0305   GLUCOSE 235 (H) 08/05/2014 1839   BUN 39 (H) 09/11/2018 0305   BUN 26 02/21/2015 1108   BUN 19 08/05/2014 1839   CREATININE  1.58 (H) 09/11/2018 0305   CREATININE 1.20 08/05/2014 1839   CALCIUM 8.9 09/11/2018 0305   CALCIUM 8.6 (L) 08/05/2014 1839   GFRNONAA 40 (L) 09/11/2018 0305   GFRNONAA 57 (L) 08/05/2014 1839   GFRAA 46 (L) 09/11/2018 0305   GFRAA >60 08/05/2014 1839   Estimated Creatinine Clearance: 35.5 mL/min (A) (by C-G formula based on SCr of 1.58 mg/dL (H)).  COAG Lab Results  Component Value Date   INR 1.2 09/11/2018   INR 1.4 (H) 08/24/2018   INR 1.2 06/09/2018   Radiology Dg Ribs Unilateral W/chest Right  Result Date: 09/10/2018 CLINICAL DATA:  Right anterior lower rib pain after fall EXAM: RIGHT RIBS AND CHEST - 3+ VIEW COMPARISON:  08/24/2018 chest radiograph. FINDINGS: Stable cardiomediastinal silhouette with mild cardiomegaly. No pneumothorax. No pleural effusion. Diffuse prominence of the central interstitial markings, slightly decreased from prior, favor mild pulmonary edema. No fracture or suspicious focal osseous lesion seen in the right ribs. IMPRESSION: 1. Cardiomegaly with diffuse prominence of the central interstitial markings, favor mild pulmonary edema, slightly improved from prior. 2. No right rib fracture detected. Should the patient's symptoms persist or worsen, repeat radiographs of the ribs in 10 - 14 days maybe of use to detect subtle nondisplaced rib fractures (which are commonly occult on initial imaging). Electronically Signed   By: Ilona Sorrel M.D.   On: 09/10/2018 12:06   Ct Head Wo Contrast  Result Date: 09/10/2018 CLINICAL DATA:  Fall EXAM: CT HEAD WITHOUT CONTRAST CT MAXILLOFACIAL WITHOUT CONTRAST CT CERVICAL SPINE WITHOUT CONTRAST TECHNIQUE: Multidetector CT imaging of the head, cervical spine, and maxillofacial structures were performed using the standard protocol without intravenous contrast. Multiplanar CT image reconstructions of the cervical spine and maxillofacial structures were also generated. COMPARISON:  CT head dated 08/11/2018 FINDINGS: Motion degraded  images. CT HEAD FINDINGS Brain: No evidence of acute infarction, hemorrhage, hydrocephalus, extra-axial collection or mass lesion/mass effect. Encephalomalacic changes in the right occipital lobe, chronic. Subcortical white matter and periventricular small vessel ischemic changes. Vascular: Intracranial atherosclerosis. Skull: Normal. Negative for fracture or focal lesion. Other: None. CT MAXILLOFACIAL FINDINGS Osseous: No evidence of maxillofacial fracture. The mandible is intact. The bilateral mandibular condyles are well-seated in the TMJs. Orbits: The bilateral orbits, including the globes and retroconal soft tissues, are within normal limits. Sinuses: The visualized paranasal sinuses are essentially clear. The mastoid air cells are unopacified. Soft tissues: Negative. CT CERVICAL SPINE FINDINGS Alignment: Straightening of the normal upper cervical lordosis. Skull base and vertebrae: No acute fracture. No primary bone lesion or focal pathologic process. Soft tissues  and spinal canal: No prevertebral fluid or swelling. No visible canal hematoma. Disc levels: Mild to moderate degenerative changes of the mid/lower cervical spine. Spinal canal is patent. Upper chest: Visualized thyroid is grossly unremarkable. Other: None. IMPRESSION: Motion degraded images. No evidence of acute intracranial abnormality. 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 Small vessel ischemic changes. No evidence of maxillofacial fracture. No evidence of traumatic injury to the cervical spine. Mild to moderate degenerative changes. Electronically Signed   By: Julian Hy M.D.   On: 09/10/2018 11:52   Ct Cervical Spine Wo Contrast  Result Date: 09/10/2018 CLINICAL DATA:  Fall EXAM: CT HEAD WITHOUT CONTRAST CT MAXILLOFACIAL WITHOUT CONTRAST CT CERVICAL SPINE WITHOUT CONTRAST TECHNIQUE: Multidetector CT imaging of the head, cervical spine, and maxillofacial structures were performed using the standard protocol without intravenous contrast.  Multiplanar CT image reconstructions of the cervical spine and maxillofacial structures were also generated. COMPARISON:  CT head dated 08/11/2018 FINDINGS: Motion degraded images. CT HEAD FINDINGS Brain: No evidence of acute infarction, hemorrhage, hydrocephalus, extra-axial collection or mass lesion/mass effect. Encephalomalacic changes in the right occipital lobe, chronic. Subcortical white matter and periventricular small vessel ischemic changes. Vascular: Intracranial atherosclerosis. Skull: Normal. Negative for fracture or focal lesion. Other: None. CT MAXILLOFACIAL FINDINGS Osseous: No evidence of maxillofacial fracture. The mandible is intact. The bilateral mandibular condyles are well-seated in the TMJs. Orbits: The bilateral orbits, including the globes and retroconal soft tissues, are within normal limits. Sinuses: The visualized paranasal sinuses are essentially clear. The mastoid air cells are unopacified. Soft tissues: Negative. CT CERVICAL SPINE FINDINGS Alignment: Straightening of the normal upper cervical lordosis. Skull base and vertebrae: No acute fracture. No primary bone lesion or focal pathologic process. Soft tissues and spinal canal: No prevertebral fluid or swelling. No visible canal hematoma. Disc levels: Mild to moderate degenerative changes of the mid/lower cervical spine. Spinal canal is patent. Upper chest: Visualized thyroid is grossly unremarkable. Other: None. IMPRESSION: Motion degraded images. No evidence of acute intracranial abnormality. 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 Small vessel ischemic changes. No evidence of maxillofacial fracture. No evidence of traumatic injury to the cervical spine. Mild to moderate degenerative changes. Electronically Signed   By: Julian Hy M.D.   On: 09/10/2018 11:52   US Venous Img Lower Unilateral Left  Result Date: 08/24/2018 CLINICAL DATA:  Left lower extremity swelling. EXAM: Left LOWER EXTREMITY VENOUS DOPPLER ULTRASOUND  TECHNIQUE: Gray-scale sonography with graded compression, as well as color Doppler and duplex ultrasound were performed to evaluate the lower extremity deep venous systems from the level of the common femoral vein and including the common femoral, femoral, profunda femoral, popliteal and calf veins including the posterior tibial, peroneal and gastrocnemius veins when visible. The superficial great saphenous vein was also interrogated. Spectral Doppler was utilized to evaluate flow at rest and with distal augmentation maneuvers in the common femoral, femoral and popliteal veins. COMPARISON:  None. FINDINGS: Contralateral Common Femoral Vein: Respiratory phasicity is normal and symmetric with the symptomatic side. No evidence of thrombus. Normal compressibility. Common Femoral Vein: No evidence of thrombus. Normal compressibility, respiratory phasicity and response to augmentation. Saphenofemoral Junction: No evidence of thrombus. Normal compressibility and flow on color Doppler imaging. Profunda Femoral Vein: No evidence of thrombus. Normal compressibility and flow on color Doppler imaging. Femoral Vein: No evidence of thrombus. Normal compressibility, respiratory phasicity and response to augmentation. Popliteal Vein: No evidence of thrombus. Normal compressibility, respiratory phasicity and response to augmentation. Calf Veins: No evidence of thrombus. Normal compressibility and flow  on color Doppler imaging. Venous Reflux:  None. Other Findings:  Edema is noted in the subcutaneous tissues. IMPRESSION: No definite evidence of deep venous thrombosis seen in the left lower extremity. Electronically Signed   By: Marijo Conception M.D.   On: 08/24/2018 19:51   Dg Chest Portable 1 View  Result Date: 08/24/2018 CLINICAL DATA:  Shortness of breath. EXAM: PORTABLE CHEST 1 VIEW COMPARISON:  Radiograph of Aug 11, 2018. FINDINGS: Stable cardiomegaly. Atherosclerosis of thoracic aorta is noted. Increased reticular densities  are noted throughout both lungs concerning for worsening edema or possibly atypical inflammation superimposed upon chronic interstitial disease. No pneumothorax or pleural effusion is noted. Bony thorax is unremarkable. IMPRESSION: Stable cardiomegaly. Increased reticular densities are noted throughout both lungs concerning for superimposed edema or atypical inflammation. Aortic Atherosclerosis (ICD10-I70.0). Electronically Signed   By: Marijo Conception M.D.   On: 08/24/2018 17:44   Dg Foot 2 Views Left  Result Date: 08/24/2018 CLINICAL DATA:  Infection. EXAM: LEFT FOOT - 2 VIEW COMPARISON:  MRI and radiographs of June 09, 2018. 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 Soft tissue gas is noted in this area which may represent postsurgical changes or possibly infection. No definite lytic destruction is seen to suggest osteomyelitis currently. Mild posterior calcaneal spurring is noted. 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 Soft tissue gas is noted in this area which may represent postsurgical changes or possibly infection. No definite evidence of osteomyelitis. Electronically Signed   By: Marijo Conception M.D.   On: 08/24/2018 18:20   Ct Maxillofacial Wo Contrast  Result Date: 09/10/2018 CLINICAL DATA:  Fall EXAM: CT HEAD WITHOUT CONTRAST CT MAXILLOFACIAL WITHOUT CONTRAST CT CERVICAL SPINE WITHOUT CONTRAST TECHNIQUE: Multidetector CT imaging of the head, cervical spine, and maxillofacial structures were performed using the standard protocol without intravenous contrast. Multiplanar CT image reconstructions of the cervical spine and maxillofacial structures were also generated. COMPARISON:  CT head dated 08/11/2018 FINDINGS: Motion degraded images. CT HEAD FINDINGS Brain: No evidence of acute infarction, hemorrhage, hydrocephalus, extra-axial collection or mass lesion/mass effect. Encephalomalacic  changes in the right occipital lobe, chronic. Subcortical white matter and periventricular small vessel ischemic changes. Vascular: Intracranial atherosclerosis. Skull: Normal. Negative for fracture or focal lesion. Other: None. CT MAXILLOFACIAL FINDINGS Osseous: No evidence of maxillofacial fracture. The mandible is intact. The bilateral mandibular condyles are well-seated in the TMJs. Orbits: The bilateral orbits, including the globes and retroconal soft tissues, are within normal limits. Sinuses: The visualized paranasal sinuses are essentially clear. The mastoid air cells are unopacified. Soft tissues: Negative. CT CERVICAL SPINE FINDINGS Alignment: Straightening of the normal upper cervical lordosis. Skull base and vertebrae: No acute fracture. No primary bone lesion or focal pathologic process. Soft tissues and spinal canal: No prevertebral fluid or swelling. No visible canal hematoma. Disc levels: Mild to moderate degenerative changes of the mid/lower cervical spine. Spinal canal is patent. Upper chest: Visualized thyroid is grossly unremarkable. Other: None. IMPRESSION: Motion degraded images. No evidence of acute intracranial abnormality. 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 Small vessel ischemic changes. No evidence of maxillofacial fracture. No evidence of traumatic injury to the cervical spine. Mild to moderate degenerative changes. Electronically Signed   By: Julian Hy M.D.   On: 09/10/2018 11:52   Assessment/Plan The patient is an 85 year old male with past medical history of type 2 diabetes, hyperlipidemia, mitral regurgitation, hypertension, coronary artery disease status post MI, chronic atrial  fibrillation on Eliquis, CVA in the past, chronic diastolic congestive heart failure, BPH, COPD, hypothyroidism and PAD who presents with a slow healing wound to the left foot 1. PAD: Patient with a known history of peripheral artery disease requiring endovascular intervention March 2020.  Patient  with multiple risk factors for peripheral artery disease.  Patient's left foot wound is extremely slow to heal with the formation of no wounds.  Recommend undergoing a left lower extremity angiogram with possible intervention and attempt to assess the patient's anatomy and degree of contributing peripheral artery disease.  If appropriate, an attempt to revascularize the leg can be made at that time.  Procedure, risks and benefits explained to the patient.  All questions answered. The patient wishes to proceed.  We will plan on this on Wednesday with Dr. Lucky Cowboy 2.  Hyperlipidemia: On Lipitor and Zetia. Encouraged good control as its slows the progression of atherosclerotic disease 3.  Diabetes: On appropriate medications.Encouraged good control as its slows the progression of atherosclerotic disease and directly affects wound healing 4.  Congestive heart failure: This is certainly contributing factor to the patient's bilateral lower extremity edema.  Recommend elevation and compression sock to the right lower extremity.  Once the patient's left lower extremity has healed recommend compression sock as well. 5.  Hypertension: Encouraged good control as its slows the progression of atherosclerotic disease.  Discussed with Dr. Mayme Genta, PA-C  09/11/2018 1:53 PM  This note was created with Dragon medical transcription system.  Any error is purely unintentional

## 2018-09-12 ENCOUNTER — Other Ambulatory Visit (INDEPENDENT_AMBULATORY_CARE_PROVIDER_SITE_OTHER): Payer: Self-pay | Admitting: Vascular Surgery

## 2018-09-12 ENCOUNTER — Inpatient Hospital Stay: Payer: Medicare Other

## 2018-09-12 LAB — GLUCOSE, CAPILLARY
Glucose-Capillary: 82 mg/dL (ref 70–99)
Glucose-Capillary: 146 mg/dL — ABNORMAL HIGH (ref 70–99)
Glucose-Capillary: 208 mg/dL — ABNORMAL HIGH (ref 70–99)
Glucose-Capillary: 300 mg/dL — ABNORMAL HIGH (ref 70–99)

## 2018-09-12 LAB — CBC WITH DIFFERENTIAL/PLATELET
Abs Immature Granulocytes: 0.14 10*3/uL — ABNORMAL HIGH (ref 0.00–0.07)
Basophils Absolute: 0 10*3/uL (ref 0.0–0.1)
Basophils Relative: 0 %
Eosinophils Absolute: 0.1 10*3/uL (ref 0.0–0.5)
Eosinophils Relative: 1 %
HCT: 39.1 % (ref 39.0–52.0)
Hemoglobin: 11.9 g/dL — ABNORMAL LOW (ref 13.0–17.0)
Immature Granulocytes: 1 %
Lymphocytes Relative: 17 %
Lymphs Abs: 2.2 10*3/uL (ref 0.7–4.0)
MCH: 28.5 pg (ref 26.0–34.0)
MCHC: 30.4 g/dL (ref 30.0–36.0)
MCV: 93.5 fL (ref 80.0–100.0)
Monocytes Absolute: 1.7 10*3/uL — ABNORMAL HIGH (ref 0.1–1.0)
Monocytes Relative: 13 %
Neutro Abs: 8.6 10*3/uL — ABNORMAL HIGH (ref 1.7–7.7)
Neutrophils Relative %: 68 %
Platelets: 217 10*3/uL (ref 150–400)
RBC: 4.18 MIL/uL — ABNORMAL LOW (ref 4.22–5.81)
RDW: 17.2 % — ABNORMAL HIGH (ref 11.5–15.5)
WBC: 12.6 10*3/uL — ABNORMAL HIGH (ref 4.0–10.5)
nRBC: 0 % (ref 0.0–0.2)

## 2018-09-12 LAB — BASIC METABOLIC PANEL
Anion gap: 6 (ref 5–15)
BUN: 33 mg/dL — ABNORMAL HIGH (ref 8–23)
CO2: 27 mmol/L (ref 22–32)
Calcium: 8.5 mg/dL — ABNORMAL LOW (ref 8.9–10.3)
Chloride: 108 mmol/L (ref 98–111)
Creatinine, Ser: 1.37 mg/dL — ABNORMAL HIGH (ref 0.61–1.24)
GFR calc Af Amer: 55 mL/min — ABNORMAL LOW (ref >60)
GFR calc non Af Amer: 47 mL/min — ABNORMAL LOW (ref >60)
Glucose, Bld: 142 mg/dL — ABNORMAL HIGH (ref 70–99)
Potassium: 4 mmol/L (ref 3.5–5.1)
Sodium: 141 mmol/L (ref 135–145)

## 2018-09-12 MED ORDER — VANCOMYCIN HCL IN DEXTROSE 750-5 MG/150ML-% IV SOLN
750.0000 mg | INTRAVENOUS | Status: DC
  Administered 2018-09-12 – 2018-09-15 (×4): 750 mg via INTRAVENOUS
  Filled 2018-09-12 (×5): qty 150

## 2018-09-12 MED ORDER — METHYLPREDNISOLONE SODIUM SUCC 125 MG IJ SOLR
60.0000 mg | Freq: Two times a day (BID) | INTRAMUSCULAR | Status: DC
  Administered 2018-09-12 – 2018-09-14 (×5): 60 mg via INTRAVENOUS
  Filled 2018-09-12 (×5): qty 2

## 2018-09-12 MED ORDER — METOLAZONE 5 MG PO TABS
5.0000 mg | ORAL_TABLET | Freq: Once | ORAL | Status: AC
  Administered 2018-09-12: 5 mg via ORAL
  Filled 2018-09-12: qty 1

## 2018-09-12 MED ORDER — SODIUM CHLORIDE 0.9 % IV SOLN
INTRAVENOUS | Status: DC
  Administered 2018-09-13 – 2018-09-16 (×4): via INTRAVENOUS

## 2018-09-12 MED ORDER — FUROSEMIDE 10 MG/ML IJ SOLN
60.0000 mg | Freq: Once | INTRAMUSCULAR | Status: AC
  Administered 2018-09-12: 60 mg via INTRAVENOUS
  Filled 2018-09-12: qty 8

## 2018-09-12 MED ORDER — FUROSEMIDE 10 MG/ML IJ SOLN
60.0000 mg | Freq: Two times a day (BID) | INTRAMUSCULAR | Status: DC
  Administered 2018-09-12 – 2018-09-16 (×6): 60 mg via INTRAVENOUS
  Filled 2018-09-12 (×6): qty 8

## 2018-09-12 NOTE — Progress Notes (Signed)
[facility] [place] NAME: Shashank Kwasnik    MR#:  [id number]  DATE OF BIRTH:  1931-09-30  SUBJECTIVE:  CHIEF COMPLAINT:   Chief Complaint  Patient presents with  . Fall   Shortness of breath overnight. Complains of right chest pain since his fall. Afebrile  REVIEW OF SYSTEMS:    Review of Systems  Constitutional: Positive for malaise/fatigue. Negative for chills and fever.  HENT: Negative for sore throat.   Eyes: Negative for blurred vision, double vision and pain.  Respiratory: Negative for cough, hemoptysis, shortness of breath and wheezing.   Cardiovascular: Negative for chest pain, palpitations, orthopnea and leg swelling.  Gastrointestinal: Positive for constipation. Negative for abdominal pain, diarrhea, heartburn, nausea and vomiting.  Genitourinary: Negative for dysuria and hematuria.  Musculoskeletal: Positive for joint pain. Negative for back pain.  Skin: Negative for rash.  Neurological: Negative for sensory change, speech change, focal weakness and headaches.  Endo/Heme/Allergies: Does not bruise/bleed easily.  Psychiatric/Behavioral: Negative for depression. The patient is not nervous/anxious.     DRUG ALLERGIES:   Allergies  Allergen Reactions  . Contrast Media [Iodinated Diagnostic Agents] Shortness Of Breath  . Morphine And Related Other (See Comments)    Hallucinations   . Niacin And Related Dermatitis  . Other     Other reaction(s): SHORTNESS OF BREATH  . Amlodipine Rash    VITALS:  Blood pressure (!) 145/93, pulse 79, temperature 97.6 F (36.4 C), temperature source Oral, resp. rate 16, height 5\' 6"  (1.676 m), weight 84.8 kg, SpO2 95 %.  PHYSICAL EXAMINATION:   Physical Exam  GENERAL:  85 y.o.-year-old patient lying in the bed with mild conversational dyspnea.  Obese EYES: Pupils equal, round, reactive to light and accommodation. No scleral icterus. Extraocular muscles intact.  HEENT: Head  atraumatic, normocephalic. Oropharynx and nasopharynx clear.  NECK:  Supple, no jugular venous distention. No thyroid enlargement, no tenderness.  LUNGS: Bilateral wheezing.  Decreased air entry. CARDIOVASCULAR: S1, S2 normal. No murmurs, rubs, or gallops.  ABDOMEN: Soft, nontender, nondistended. Bowel sounds present. No organomegaly or mass.  EXTREMITIES:  Bilateral lower extremity edema. left foot dressing NEUROLOGIC: Cranial nerves II through XII are intact. No focal Motor or sensory deficits b/l.   PSYCHIATRIC: The patient is alert and oriented x 3.  SKIN: No obvious rash, lesion, or ulcer.   LABORATORY PANEL:   CBC Recent Labs  Lab 09/12/18 0312  WBC 12.6*  HGB 11.9*  HCT 39.1  PLT 217   ------------------------------------------------------------------------------------------------------------------ Chemistries  Recent Labs  Lab 09/11/18 0305 09/12/18 0312  NA 143 141  K 4.3 4.0  CL 108 108  CO2 27 27  GLUCOSE 147* 142*  BUN 39* 33*  CREATININE 1.58* 1.37*  CALCIUM 8.9 8.5*  MG 2.4  --    ------------------------------------------------------------------------------------------------------------------  Cardiac Enzymes No results for input(s): TROPONINI in the last 168 hours. ------------------------------------------------------------------------------------------------------------------  RADIOLOGY:  Dg Chest 2 View  Result Date: 09/12/2018 CLINICAL DATA:  Chest pain EXAM: CHEST - 2 VIEW COMPARISON:  Sep 10, 2018 and April 17, 2018 FINDINGS: There is cardiomegaly with pulmonary venous hypertension. There is diffuse reticulonodular interstitial disease with small pleural effusions bilaterally. There is no frank consolidation. No adenopathy. There is aortic atherosclerosis. Bones are osteoporotic. IMPRESSION: Findings felt to be indicative of congestive heart failure, with suspected degree of underlying chronic interstitial lung disease. No consolidation. Small  pleural effusions bilaterally. There is an apparent increase in interstitial edema compared to 2 days prior.  Aortic Atherosclerosis (ICD10-I70.0).  Bones osteoporotic. Electronically Signed   By: Lowella Grip III M.D.   On: 09/12/2018 08:26   Dg Ribs Unilateral W/chest Right  Result Date: 09/10/2018 CLINICAL DATA:  Right anterior lower rib pain after fall EXAM: RIGHT RIBS AND CHEST - 3+ VIEW COMPARISON:  08/24/2018 chest radiograph. FINDINGS: Stable cardiomediastinal silhouette with mild cardiomegaly. No pneumothorax. No pleural effusion. Diffuse prominence of the central interstitial markings, slightly decreased from prior, favor mild pulmonary edema. No fracture or suspicious focal osseous lesion seen in the right ribs. IMPRESSION: 1. Cardiomegaly with diffuse prominence of the central interstitial markings, favor mild pulmonary edema, slightly improved from prior. 2. No right rib fracture detected. Should the patient's symptoms persist or worsen, repeat radiographs of the ribs in 10 - 14 days maybe of use to detect subtle nondisplaced rib fractures (which are commonly occult on initial imaging). Electronically Signed   By: Ilona Sorrel M.D.   On: 09/10/2018 12:06   Ct Head Wo Contrast  Result Date: 09/10/2018 CLINICAL DATA:  Fall EXAM: CT HEAD WITHOUT CONTRAST CT MAXILLOFACIAL WITHOUT CONTRAST CT CERVICAL SPINE WITHOUT CONTRAST TECHNIQUE: Multidetector CT imaging of the head, cervical spine, and maxillofacial structures were performed using the standard protocol without intravenous contrast. Multiplanar CT image reconstructions of the cervical spine and maxillofacial structures were also generated. COMPARISON:  CT head dated 08/11/2018 FINDINGS: Motion degraded images. CT HEAD FINDINGS Brain: No evidence of acute infarction, hemorrhage, hydrocephalus, extra-axial collection or mass lesion/mass effect. Encephalomalacic changes in the right occipital lobe, chronic. Subcortical white matter and  periventricular small vessel ischemic changes. Vascular: Intracranial atherosclerosis. Skull: Normal. Negative for fracture or focal lesion. Other: None. CT MAXILLOFACIAL FINDINGS Osseous: No evidence of maxillofacial fracture. The mandible is intact. The bilateral mandibular condyles are well-seated in the TMJs. Orbits: The bilateral orbits, including the globes and retroconal soft tissues, are within normal limits. Sinuses: The visualized paranasal sinuses are essentially clear. The mastoid air cells are unopacified. Soft tissues: Negative. CT CERVICAL SPINE FINDINGS Alignment: Straightening of the normal upper cervical lordosis. Skull base and vertebrae: No acute fracture. No primary bone lesion or focal pathologic process. Soft tissues and spinal canal: No prevertebral fluid or swelling. No visible canal hematoma. Disc levels: Mild to moderate degenerative changes of the mid/lower cervical spine. Spinal canal is patent. Upper chest: Visualized thyroid is grossly unremarkable. Other: None. IMPRESSION: Motion degraded images. No evidence of acute intracranial abnormality. 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 Small vessel ischemic changes. No evidence of maxillofacial fracture. No evidence of traumatic injury to the cervical spine. Mild to moderate degenerative changes. Electronically Signed   By: Julian Hy M.D.   On: 09/10/2018 11:52   Ct Cervical Spine Wo Contrast  Result Date: 09/10/2018 CLINICAL DATA:  Fall EXAM: CT HEAD WITHOUT CONTRAST CT MAXILLOFACIAL WITHOUT CONTRAST CT CERVICAL SPINE WITHOUT CONTRAST TECHNIQUE: Multidetector CT imaging of the head, cervical spine, and maxillofacial structures were performed using the standard protocol without intravenous contrast. Multiplanar CT image reconstructions of the cervical spine and maxillofacial structures were also generated. COMPARISON:  CT head dated 08/11/2018 FINDINGS: Motion degraded images. CT HEAD FINDINGS Brain: No evidence of acute  infarction, hemorrhage, hydrocephalus, extra-axial collection or mass lesion/mass effect. Encephalomalacic changes in the right occipital lobe, chronic. Subcortical white matter and periventricular small vessel ischemic changes. Vascular: Intracranial atherosclerosis. Skull: Normal. Negative for fracture or focal lesion. Other: None. CT MAXILLOFACIAL FINDINGS Osseous: No evidence of maxillofacial fracture. The mandible is intact. The bilateral mandibular condyles are well-seated  in the TMJs. Orbits: The bilateral orbits, including the globes and retroconal soft tissues, are within normal limits. Sinuses: The visualized paranasal sinuses are essentially clear. The mastoid air cells are unopacified. Soft tissues: Negative. CT CERVICAL SPINE FINDINGS Alignment: Straightening of the normal upper cervical lordosis. Skull base and vertebrae: No acute fracture. No primary bone lesion or focal pathologic process. Soft tissues and spinal canal: No prevertebral fluid or swelling. No visible canal hematoma. Disc levels: Mild to moderate degenerative changes of the mid/lower cervical spine. Spinal canal is patent. Upper chest: Visualized thyroid is grossly unremarkable. Other: None. IMPRESSION: Motion degraded images. No evidence of acute intracranial abnormality. 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 Small vessel ischemic changes. No evidence of maxillofacial fracture. No evidence of traumatic injury to the cervical spine. Mild to moderate degenerative changes. Electronically Signed   By: Julian Hy M.D.   On: 09/10/2018 11:52   Ct Maxillofacial Wo Contrast  Result Date: 09/10/2018 CLINICAL DATA:  Fall EXAM: CT HEAD WITHOUT CONTRAST CT MAXILLOFACIAL WITHOUT CONTRAST CT CERVICAL SPINE WITHOUT CONTRAST TECHNIQUE: Multidetector CT imaging of the head, cervical spine, and maxillofacial structures were performed using the standard protocol without intravenous contrast. Multiplanar CT image reconstructions of the cervical  spine and maxillofacial structures were also generated. COMPARISON:  CT head dated 08/11/2018 FINDINGS: Motion degraded images. CT HEAD FINDINGS Brain: No evidence of acute infarction, hemorrhage, hydrocephalus, extra-axial collection or mass lesion/mass effect. Encephalomalacic changes in the right occipital lobe, chronic. Subcortical white matter and periventricular small vessel ischemic changes. Vascular: Intracranial atherosclerosis. Skull: Normal. Negative for fracture or focal lesion. Other: None. CT MAXILLOFACIAL FINDINGS Osseous: No evidence of maxillofacial fracture. The mandible is intact. The bilateral mandibular condyles are well-seated in the TMJs. Orbits: The bilateral orbits, including the globes and retroconal soft tissues, are within normal limits. Sinuses: The visualized paranasal sinuses are essentially clear. The mastoid air cells are unopacified. Soft tissues: Negative. CT CERVICAL SPINE FINDINGS Alignment: Straightening of the normal upper cervical lordosis. Skull base and vertebrae: No acute fracture. No primary bone lesion or focal pathologic process. Soft tissues and spinal canal: No prevertebral fluid or swelling. No visible canal hematoma. Disc levels: Mild to moderate degenerative changes of the mid/lower cervical spine. Spinal canal is patent. Upper chest: Visualized thyroid is grossly unremarkable. Other: None. IMPRESSION: Motion degraded images. No evidence of acute intracranial abnormality. 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 Small vessel ischemic changes. No evidence of maxillofacial fracture. No evidence of traumatic injury to the cervical spine. Mild to moderate degenerative changes. Electronically Signed   By: Julian Hy M.D.   On: 09/10/2018 11:52     ASSESSMENT AND PLAN:   Patient is an 68 left male with history of chronic atrial fibrillation, chronic diastolic CHF hypertension, diabetes mellitus, COPD, hyperlipidemia and hypothyroidism who presented to the  emergency room after mechanical fall at home.  Patient was diagnosed with sepsis secondary to urinary tract infection.  *COPD exacerbation.  Will start steroids.  Nebulizers as needed.  Inhalers from home.  Oxygen as needed.  * Sepsis secondary to urinary tract infection. Cultures pending.  Continue IV antibiotics. Sepsis has resolved at this time.  *  Cellulitis of left lower extremity. Podiatry consulted.  Considering debridement.  Continue IV antibiotics. Left lower extremity angiogram tomorrow with vascular surgery  *  Chronic atrial fibrillation Rate controlled.  Patient on anticoagulation with Eliquis.  Will hold for procedure.  Need to restart after that.  *  Hypothyroidism Continue Synthroid.  TSH level normal  *  Diabetes mellitus type 2 Continued home insulin regimen.  Placed on sliding scale insulin coverage.  *Acute kidney injury over CKD stage III. Improving  DVT prophylaxis Was held for procedure  All the records are reviewed and case discussed with Care Management/Social Worker Management plans discussed with the patient, family and they are in agreement.  CODE STATUS: Full code  DVT Prophylaxis: SCDs  TOTAL TIME TAKING CARE OF THIS PATIENT: 35 minutes.   POSSIBLE D/C IN 1-2 DAYS, DEPENDING ON CLINICAL CONDITION.  [name] Layliana Devins M.D on 09/12/2018 at 8:46 AM  Between 7am to 6pm - Pager - [phone]  After 6pm go to [email or web address] - password [email or web address] [facility]  Office  [phone]  CC: Primary care physician; Jearld Fenton, NP  Note: This dictation was prepared with Dragon dictation along with smaller phrase technology. Any transcriptional errors that result from this process are unintentional.

## 2018-09-12 NOTE — Evaluation (Signed)
Physical Therapy Evaluation Patient Details Name: [name]. MRN: [id number] DOB: 16-Apr-1931 Today's Date: 09/12/2018   History of Present Illness  presented to ER secondary to mechanical fall in home environment; admitted for management of sepsis related to UTI, L LE cellullitis.  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  Clinical Impression  Upon evaluation, patient alert and oriented; follows simple commands and demonstrates good effort, eagerness for OOB attempts.  Generally weak and deconditioned; noted erythema L mid-calf distally with ace wrap/kerlix to surgical site.  Bilat offloading shoes donned for all OOB/WBing efforts.  Currently requiring min assist for sit/stand, basic transfers and gait (5') with RW.  Demonstrates 3- point, step to gait pattern; drop foot L LE; poor balance in A/P plane (requring use of RW and +1 at all times), complicated by necessary use of L heel wedge shoe.  Very high risk for LOB and falls. Would benefit from skilled PT to address above deficits and promote optimal return to PLOF;d recommend transition to STR upon discharge from acute hospitalization.     Follow Up Recommendations SNF    Equipment Recommendations       Recommendations for Other Services       Precautions / Restrictions Precautions Precautions: Fall Precaution Comments: R post op shoe, L heel wedge shoe Restrictions Weight Bearing Restrictions: Yes Other Position/Activity Restrictions: heel WBing      Mobility  Bed Mobility Overal bed mobility: Modified Independent                Transfers Overall transfer level: Needs assistance Equipment used: Rolling walker (2 wheeled) Transfers: Sit to/from Stand Sit to Stand: Min assist         General transfer comment: assist for anterior weight translation with lift off, standing balance  Ambulation/Gait Ambulation/Gait  assistance: Min assist Gait Distance (Feet): 5 Feet Assistive device: Rolling walker (2 wheeled)       General Gait Details: 3- point, step to gait pattern; drop foot L LE; poor balance in A/P plane (requring use of RW and +1 at all times), complicated by necessary use of L heel wedge shoe  Stairs            Wheelchair Mobility    Modified Rankin (Stroke Patients Only)       Balance Overall balance assessment: Needs assistance;History of Falls Sitting-balance support: No upper extremity supported Sitting balance-Leahy Scale: Good     Standing balance support: Bilateral upper extremity supported Standing balance-Leahy Scale: Poor                               Pertinent Vitals/Pain Pain Assessment: Faces Faces Pain Scale: Hurts little more Pain Location: R flank Pain Descriptors / Indicators: Aching;Grimacing;Guarding Pain Intervention(s): Limited activity within patient's tolerance;Monitored during session;Repositioned    Home Living Family/patient expects to be discharged to:: Private residence Living Arrangements: Spouse/significant other(niece) Available Help at Discharge: Family Type of Home: House Home Access: Stairs to enter Entrance Stairs-Rails: Right;Left;Can reach both Entrance Stairs-Number of Steps: 2 Home Layout: One level Home Equipment: Environmental consultant - 2 wheels;Walker - 4 wheels;Cane - single point;Bedside commode;Shower seat;Shower seat - built in;Grab bars - toilet;Grab bars - tub/shower;Wheelchair - manual;Hospital bed;Other (comment) Additional Comments: Niece and dtr provide assistance with household chores, meals and medications/medical needs    Prior Function Level of Independence: Needs assistance   Gait / Transfers Assistance Needed:  Pt ambulates in home with rollator vs front wheeled walker. He reports at least 6 falls in the last 12 months, most recent related to this admission           Hand Dominance         Extremity/Trunk Assessment   Upper Extremity Assessment Upper Extremity Assessment: Generalized weakness    Lower Extremity Assessment Lower Extremity Assessment: Generalized weakness(grossly at least 3-/5; erythema to L mid-calf distally, foot ace wrapped.  Some degree of drop foot L LE noted during transfer)       Communication      Cognition Arousal/Alertness: Awake/alert Behavior During Therapy: WFL for tasks assessed/performed Overall Cognitive Status: Within Functional Limits for tasks assessed                                        General Comments      Exercises Other Exercises Other Exercises: Assisted with gown change, min assist for UBD; dep assist for LBD/socks. Maintains unsupported sitting with mod indep, but requires UE stabilization for movement >6" from immediate BOS Other Exercises: Intermittent rest periods and cuing for pursed lip breathing throughout due to mod SOB with exertion.   Assessment/Plan    PT Assessment Patient needs continued PT services  PT Problem List Decreased strength;Decreased range of motion;Decreased activity tolerance;Decreased balance;Decreased mobility;Decreased coordination;Decreased knowledge of use of DME;Decreased knowledge of precautions;Cardiopulmonary status limiting activity;Decreased skin integrity;Pain       PT Treatment Interventions DME instruction;Gait training;Stair training;Therapeutic activities;Balance training;Therapeutic exercise;Neuromuscular re-education;Patient/family education    PT Goals (Current goals can be found in the Care Plan section)  Acute Rehab PT Goals Patient Stated Goal: to get stronger PT Goal Formulation: With patient Time For Goal Achievement: 09/26/18 Potential to Achieve Goals: Good    Frequency Min 2X/week   Barriers to discharge        Co-evaluation               AM-PAC PT "6 Clicks" Mobility  Outcome Measure Help needed turning from your back to your side  while in a flat bed without using bedrails?: None Help needed moving from lying on your back to sitting on the side of a flat bed without using bedrails?: None Help needed moving to and from a bed to a chair (including a wheelchair)?: A Little Help needed standing up from a chair using your arms (e.g., wheelchair or bedside chair)?: A Little Help needed to walk in hospital room?: A Little Help needed climbing 3-5 steps with a railing? : A Lot 6 Click Score: 19    End of Session Equipment Utilized During Treatment: Gait belt Activity Tolerance: Patient tolerated treatment well Patient left: in chair;with call bell/phone within reach;with chair alarm set Nurse Communication: Mobility status PT Visit Diagnosis: Unsteadiness on feet (R26.81);Repeated falls (R29.6);History of falling (Z91.81)    Time: 0383-3383 PT Time Calculation (min) (ACUTE ONLY): 28 min   Charges:   PT Evaluation $PT Eval Moderate Complexity: 1 Mod PT Treatments $Therapeutic Activity: 8-22 mins       Amoni Morales H. Owens Shark, PT, DPT, NCS 09/12/18, 8:49 PM [phone]

## 2018-09-12 NOTE — Progress Notes (Signed)
[facility]   Communication Note 1) Plan on Left Lower Extremity Angiogram with Possible Intervention tomorrow (Wed) with Dr. Lucky Cowboy 2) Patient has been pre-oped.  Discussed with Dr. Ellis Parents Havanah Nelms PA-C 09/12/2018 11:04 AM

## 2018-09-12 NOTE — Progress Notes (Signed)
PT Cancellation Note  Patient Details Name: [name]. MRN: [id number] DOB: 01/19/1932   Cancelled Treatment:    Reason Eval/Treat Not Completed: Patient at procedure or test/unavailable(Evaluation re-attempted.  Patient currently receiving breathing treatment.  Will re-attempt once treatment complete)   Koy Lamp H. Owens Shark, PT, DPT, NCS 09/12/18, 11:17 AM [phone]

## 2018-09-12 NOTE — Progress Notes (Signed)
Pharmacy Antibiotic Note  [name]. is a 85 y.o. male admitted on 09/10/2018.  Pharmacy has been consulted for vancomycin and cefepime dosing for sepsis.  Vancomycin 1750 mg IV x 1 loading dose given at 1600. Will start maintenance dose tomorrow at 1800 (half life ~ 25 hours)  Plan: Will change Vancomycin 1250 mg IV Q 48 hrs to  Vancomycin 750 mg IV Q 24 hrs. Goal AUC 400-550. Expected AUC: 513 SCr used: 1.37  Continue Cefepime 2 g IV q12h    Height: 5\' 6"  (167.6 cm) Weight: 187 lb (84.8 kg) IBW/kg (Calculated) : 63.8  Temp (24hrs), Avg:97.6 F (36.4 C), Min:97.5 F (36.4 C), Max:97.6 F (36.4 C)  Recent Labs  Lab 09/10/18 1041 09/10/18 1213 09/10/18 1542 09/11/18 0305 09/12/18 0312  WBC 17.1*  --   --  12.2* 12.6*  CREATININE 1.82*  --   --  1.58* 1.37*  LATICACIDVEN  --  3.2* 1.7  --   --     Estimated Creatinine Clearance: 41 mL/min (A) (by C-G formula based on SCr of 1.37 mg/dL (H)).    Allergies  Allergen Reactions  . Contrast Media [Iodinated Diagnostic Agents] Shortness Of Breath  . Morphine And Related Other (See Comments)    Hallucinations   . Niacin And Related Dermatitis  . Other     Other reaction(s): SHORTNESS OF BREATH  . Amlodipine Rash    Antimicrobials this admission: Ceftriaxone 5/31 x 1 Vancomycin 5/31 >> Cefepime 5/31 >>  Dose adjustments this admission: As outlined above  Microbiology results: None pending - awaiting vascular eval Wed 6/3 prior to de-escalating abx    Thank you for allowing pharmacy to be a part of this patient's care.  Lu Duffel, PharmD, BCPS Clinical Pharmacist 09/12/2018 8:06 AM

## 2018-09-13 ENCOUNTER — Encounter
Admission: EM | Disposition: A | Discharge status: Home Health | Payer: Self-pay | Source: Home / Self Care | Attending: Internal Medicine

## 2018-09-13 DIAGNOSIS — L97529 Non-pressure chronic ulcer of other part of left foot with unspecified severity: Secondary | ICD-10-CM

## 2018-09-13 DIAGNOSIS — I70245 Atherosclerosis of native arteries of left leg with ulceration of other part of foot: Secondary | ICD-10-CM

## 2018-09-13 HISTORY — PX: LOWER EXTREMITY ANGIOGRAPHY: CATH118251

## 2018-09-13 LAB — BASIC METABOLIC PANEL
Anion gap: 10 (ref 5–15)
BUN: 33 mg/dL — ABNORMAL HIGH (ref 8–23)
CO2: 25 mmol/L (ref 22–32)
Calcium: 8.6 mg/dL — ABNORMAL LOW (ref 8.9–10.3)
Chloride: 103 mmol/L (ref 98–111)
Creatinine, Ser: 1.22 mg/dL (ref 0.61–1.24)
GFR calc Af Amer: >60 mL/min (ref >60)
GFR calc non Af Amer: 54 mL/min — ABNORMAL LOW (ref >60)
Glucose, Bld: 339 mg/dL — ABNORMAL HIGH (ref 70–99)
Potassium: 2.8 mmol/L — ABNORMAL LOW (ref 3.5–5.1)
Sodium: 138 mmol/L (ref 135–145)

## 2018-09-13 LAB — GLUCOSE, CAPILLARY
Glucose-Capillary: 256 mg/dL — ABNORMAL HIGH (ref 70–99)
Glucose-Capillary: 280 mg/dL — ABNORMAL HIGH (ref 70–99)
Glucose-Capillary: 236 mg/dL — ABNORMAL HIGH (ref 70–99)
Glucose-Capillary: 250 mg/dL — ABNORMAL HIGH (ref 70–99)
Glucose-Capillary: 222 mg/dL — ABNORMAL HIGH (ref 70–99)
Glucose-Capillary: 210 mg/dL — ABNORMAL HIGH (ref 70–99)
Glucose-Capillary: 267 mg/dL — ABNORMAL HIGH (ref 70–99)

## 2018-09-13 LAB — MAGNESIUM: Magnesium: 2.3 mg/dL (ref 1.7–2.4)

## 2018-09-13 LAB — POTASSIUM: Potassium: 3.4 mmol/L — ABNORMAL LOW (ref 3.5–5.1)

## 2018-09-13 SURGERY — LOWER EXTREMITY ANGIOGRAPHY
Anesthesia: Moderate Sedation | Laterality: Left

## 2018-09-13 MED ORDER — IOHEXOL 300 MG/ML  SOLN
INTRAMUSCULAR | Status: DC | PRN
  Administered 2018-09-13: 85 mL via INTRA_ARTERIAL

## 2018-09-13 MED ORDER — FAMOTIDINE 20 MG PO TABS
40.0000 mg | ORAL_TABLET | Freq: Once | ORAL | Status: AC | PRN
  Administered 2018-09-13: 40 mg via ORAL

## 2018-09-13 MED ORDER — ONDANSETRON HCL 4 MG/2ML IJ SOLN: 4.0000 mg | Freq: Four times a day (QID) | INTRAMUSCULAR | Status: DC | PRN

## 2018-09-13 MED ORDER — LIDOCAINE-EPINEPHRINE (PF) 1 %-1:200000 IJ SOLN
INTRAMUSCULAR | Status: AC
  Filled 2018-09-13: qty 30

## 2018-09-13 MED ORDER — DIPHENHYDRAMINE HCL 50 MG/ML IJ SOLN
INTRAMUSCULAR | Status: AC
  Administered 2018-09-13: 13:00:00 25 mg via INTRAVENOUS
  Filled 2018-09-13: qty 1

## 2018-09-13 MED ORDER — POTASSIUM CHLORIDE 10 MEQ/100ML IV SOLN: 10.0000 meq | Freq: Once | INTRAVENOUS | Status: DC

## 2018-09-13 MED ORDER — FAMOTIDINE 20 MG PO TABS
ORAL_TABLET | ORAL | Status: AC
  Administered 2018-09-13: 13:00:00 40 mg via ORAL
  Filled 2018-09-13: qty 2

## 2018-09-13 MED ORDER — MIDAZOLAM HCL 5 MG/5ML IJ SOLN
INTRAMUSCULAR | Status: AC
  Filled 2018-09-13: qty 5

## 2018-09-13 MED ORDER — FENTANYL CITRATE (PF) 100 MCG/2ML IJ SOLN
INTRAMUSCULAR | Status: DC | PRN
  Administered 2018-09-13: 50 ug via INTRAVENOUS

## 2018-09-13 MED ORDER — HYDROMORPHONE HCL 1 MG/ML IJ SOLN: 1.0000 mg | Freq: Once | INTRAMUSCULAR | Status: DC | PRN

## 2018-09-13 MED ORDER — MIDAZOLAM HCL 2 MG/2ML IJ SOLN
INTRAMUSCULAR | Status: DC | PRN
  Administered 2018-09-13: 2 mg via INTRAVENOUS

## 2018-09-13 MED ORDER — ASPIRIN EC 81 MG PO TBEC
81.0000 mg | DELAYED_RELEASE_TABLET | Freq: Every day | ORAL | Status: DC
  Administered 2018-09-13 – 2018-09-14 (×2): 81 mg via ORAL
  Filled 2018-09-13 (×3): qty 1

## 2018-09-13 MED ORDER — METHYLPREDNISOLONE SODIUM SUCC 125 MG IJ SOLR
125.0000 mg | Freq: Once | INTRAMUSCULAR | Status: AC | PRN
  Administered 2018-09-13: 125 mg via INTRAVENOUS

## 2018-09-13 MED ORDER — POTASSIUM CHLORIDE 10 MEQ/100ML IV SOLN
10.0000 meq | INTRAVENOUS | Status: AC
  Administered 2018-09-13 (×2): 10 meq via INTRAVENOUS
  Filled 2018-09-13 (×3): qty 100

## 2018-09-13 MED ORDER — SODIUM CHLORIDE 0.9 % IV SOLN: INTRAVENOUS | Status: DC

## 2018-09-13 MED ORDER — FENTANYL CITRATE (PF) 100 MCG/2ML IJ SOLN
INTRAMUSCULAR | Status: AC
  Filled 2018-09-13: qty 2

## 2018-09-13 MED ORDER — METHYLPREDNISOLONE SODIUM SUCC 125 MG IJ SOLR
INTRAMUSCULAR | Status: AC
  Administered 2018-09-13: 13:00:00 125 mg via INTRAVENOUS
  Filled 2018-09-13: qty 2

## 2018-09-13 MED ORDER — HEPARIN SODIUM (PORCINE) 1000 UNIT/ML IJ SOLN
INTRAMUSCULAR | Status: AC
  Filled 2018-09-13: qty 1

## 2018-09-13 MED ORDER — HEPARIN SODIUM (PORCINE) 1000 UNIT/ML IJ SOLN
INTRAMUSCULAR | Status: DC | PRN
  Administered 2018-09-13: 5000 [IU] via INTRAVENOUS

## 2018-09-13 MED ORDER — MIDAZOLAM HCL 2 MG/ML PO SYRP: 8.0000 mg | ORAL_SOLUTION | Freq: Once | ORAL | Status: DC | PRN

## 2018-09-13 MED ORDER — DIPHENHYDRAMINE HCL 50 MG/ML IJ SOLN
50.0000 mg | Freq: Once | INTRAMUSCULAR | Status: AC | PRN
  Administered 2018-09-13: 25 mg via INTRAVENOUS

## 2018-09-13 SURGICAL SUPPLY — 19 items
BALLN LUTONIX 018 5X300X130 (BALLOONS) ×3
BALLN ULTRVRSE 2.5X300X150 (BALLOONS) ×3
BALLOON LUTONIX 018 5X300X130 (BALLOONS) ×1 IMPLANT
BALLOON ULTRVRSE 2.5X300X150 (BALLOONS) ×1 IMPLANT
CATH BEACON 5 .038 100 VERT TP (CATHETERS) ×2 IMPLANT
CATH CXI SUPP ANG 4FR 135 (CATHETERS) IMPLANT
CATH CXI SUPP ANG 4FR 135CM (CATHETERS) ×3
CATH PIG 70CM (CATHETERS) ×2 IMPLANT
DEVICE PRESTO INFLATION (MISCELLANEOUS) ×2 IMPLANT
DEVICE STARCLOSE SE CLOSURE (Vascular Products) ×2 IMPLANT
GLIDEWIRE ADV .035X260CM (WIRE) ×2 IMPLANT
PACK ANGIOGRAPHY (CUSTOM PROCEDURE TRAY) ×3 IMPLANT
SHEATH ANL2 6FRX45 HC (SHEATH) ×2 IMPLANT
SHEATH BRITE TIP 5FRX11 (SHEATH) ×2 IMPLANT
STENT VIABAHN 6X250X120 (Permanent Stent) ×2 IMPLANT
SYR MEDRAD MARK 7 150ML (SYRINGE) ×2 IMPLANT
TUBING CONTRAST HIGH PRESS 72 (TUBING) ×2 IMPLANT
WIRE G V18X300CM (WIRE) ×2 IMPLANT
WIRE J 3MM .035X145CM (WIRE) ×2 IMPLANT

## 2018-09-13 NOTE — Progress Notes (Signed)
Daily Progress Note   Subjective  - Day of Surgery  F/u left foot.  S/P revascularization today  Objective Vitals:   09/13/18 1440 09/13/18 1500 09/13/18 1515 09/13/18 1530  BP: 102/68 (!) 119/56 (!) 103/56 123/68  Pulse: 65 74 66 66  Resp: (!) 21 20 19 19   Temp:      TempSrc:      SpO2: 96% 100% 100% 99%  Weight:      Height:        Physical Exam: Exposed 1st metatarsal left foot.  No purulence.  Open wound along previous 1st ray amputation  Laboratory CBC    Component Value Date/Time   WBC 12.6 (H) 09/12/2018 0312   HGB 11.9 (L) 09/12/2018 0312   HGB 13.3 08/05/2014 1839   HCT 39.1 09/12/2018 0312   HCT 40.1 08/05/2014 1839   PLT 217 09/12/2018 0312   PLT 246 08/05/2014 1839    BMET    Component Value Date/Time   NA 138 09/13/2018 0243   NA 143 02/21/2015 1108   NA 137 08/05/2014 1839   K 3.4 (L) 09/13/2018 1649   K 3.6 08/05/2014 1839   CL 103 09/13/2018 0243   CL 106 08/05/2014 1839   CO2 25 09/13/2018 0243   CO2 23 08/05/2014 1839   GLUCOSE 339 (H) 09/13/2018 0243   GLUCOSE 235 (H) 08/05/2014 1839   BUN 33 (H) 09/13/2018 0243   BUN 26 02/21/2015 1108   BUN 19 08/05/2014 1839   CREATININE 1.22 09/13/2018 0243   CREATININE 1.20 08/05/2014 1839   CALCIUM 8.6 (L) 09/13/2018 0243   CALCIUM 8.6 (L) 08/05/2014 1839   GFRNONAA 54 (L) 09/13/2018 0243   GFRNONAA 57 (L) 08/05/2014 1839   GFRAA >60 09/13/2018 0243   GFRAA >60 08/05/2014 1839    Assessment/Planning: Open wound left foot with exposed bone.  S/p 1st ray amputation   I discussed with the patient I would like to perform a removal of the exposed bone along the first ray previous amputation site and perform a delayed primary closure of the wound.  I discussed this would give him his best chance to provide closure of this wound as he just had revascularization performed.  Patient states he would like to think about this overnight.  Would plan to perform the surgery Friday morning.  I will follow-up  tomorrow to reevaluate.  Stent change performed by myself today.    Samara Deist A  09/13/2018, 6:05 PM

## 2018-09-13 NOTE — Care Management Important Message (Signed)
Important Message  Patient Details  Name: [name]. MRN: [id number] Date of Birth: 1931/07/09   Medicare Important Message Given:  Yes    Juliann Pulse A Irena Gaydos 09/13/2018, 12:15 PM

## 2018-09-13 NOTE — Progress Notes (Signed)
Inpatient Diabetes Program Recommendations  AACE/ADA: New Consensus Statement on Inpatient Glycemic Control   Target Ranges:  Prepandial:   less than 140 mg/dL      Peak postprandial:   less than 180 mg/dL (1-2 hours)      Critically ill patients:  140 - 180 mg/dL  Results for [name], [name]" (MRN [id number]) as of 09/13/2018 09:30  Ref. Range 09/12/2018 07:27 09/12/2018 11:26 09/12/2018 17:05 09/12/2018 21:07 09/13/2018 07:47  Glucose-Capillary Latest Ref Range: 70 - 99 mg/dL 82 146 (H) 208 (H) 300 (H) 280 (H)    Review of Glycemic Control  history: DM2 Outpatient Diabetes medications: Levemir 42 units QHS, Huamlog 18 units with breakfast, 3 units with lunch, 10 units with supper Current orders for Inpatient glycemic control: Levemir 25 units QHS, Novolog 0-9 units TID with meals, Novolog 0-5 units QHS; Solumedrol 60 mg Q12H   Inpatient Diabetes Program Recommendations:   Insulin-Basal: If Solumedrol is continued, please consider increasing Levemir to 28 units QHS.  Insulin-Meal Coverage: Once diet is resumed and if Solumedrol is continued, please consider ordering Novolog 3 units TID with meals for meal coverage if patient eats at least 50% of meals.  Thanks, Barnie Alderman, RN, MSN, CDE Diabetes Coordinator Inpatient Diabetes Program [phone] (Team Pager from 8am to 5pm)

## 2018-09-13 NOTE — Plan of Care (Signed)
  Problem: Clinical Measurements: Goal: Ability to maintain clinical measurements within normal limits will improve Outcome: Progressing Goal: Will remain free from infection Outcome: Progressing Goal: Diagnostic test results will improve Outcome: Progressing Goal: Respiratory complications will improve Outcome: Progressing Goal: Cardiovascular complication will be avoided Outcome: Progressing   Problem: Pain Managment: Goal: General experience of comfort will improve Outcome: Progressing   Problem: Safety: Goal: Ability to remain free from injury will improve Outcome: Progressing   Problem: Skin Integrity: Goal: Risk for impaired skin integrity will decrease Outcome: Progressing

## 2018-09-13 NOTE — Op Note (Signed)
[name]  Percutaneous Study/Intervention Procedural Note   Date of Surgery: 09/13/2018  Surgeon(s):Vanita Cannell    Assistants:none  Pre-operative Diagnosis: PAD with ulceration left lower extremity  Post-operative diagnosis:  Same  Procedure(s) Performed:             1.  Ultrasound guidance for vascular access right femoral artery             2.  Catheter placement into left common femoral artery from right femoral approach             3.  Aortogram and selective left lower extremity angiogram             4.  Percutaneous transluminal angioplasty of left posterior tibial artery with 2.5 mm diameter angioplasty balloon             5.   Percutaneous transluminal angioplasty of the left SFA and proximal popliteal artery with 5 mm diameter by 30 cm length Lutonix drug-coated angioplasty balloon  6.  Via bond stent placement to the left SFA and proximal popliteal artery for multiple areas of greater than 50% stenosis after angioplasty using a 6 mm diameter by 25 cm length stent             7.  StarClose closure device right femoral artery  EBL: 15 cc  Contrast: 85 cc  Fluoro Time: 8.1 minutes  Moderate Conscious Sedation Time: approximately 30 minutes using 2 mg of Versed and 50 Mcg of Fentanyl              Indications:  Patient is a 85 y.o.male with nonhealing ulcerations and infection of the left foot. The noninvasive studies at our hospital are inadequate. The patient is brought in for angiography for further evaluation and potential treatment.  Due to the limb threatening nature of the situation, angiogram was performed for attempted limb salvage. The patient is aware that if the procedure fails, amputation would be expected.  The patient also understands that even with successful revascularization, amputation may still be required due to the severity of the situation.  Risks and benefits are discussed and informed consent is obtained.   Procedure:  The patient  was identified and appropriate procedural time out was performed.  The patient was then placed supine on the table and prepped and draped in the usual sterile fashion. Moderate conscious sedation was administered during a face to face encounter with the patient throughout the procedure with my supervision of the RN administering medicines and monitoring the patient's vital signs, pulse oximetry, telemetry and mental status throughout from the start of the procedure until the patient was taken to the recovery room. Ultrasound was used to evaluate the right common femoral artery.  It was patent .  A digital ultrasound image was acquired.  A Seldinger needle was used to access the right common femoral artery under direct ultrasound guidance and a permanent image was performed.  A 0.035 J wire was advanced without resistance and a 5Fr sheath was placed.  Pigtail catheter was placed into the aorta and an AP aortogram was performed. This demonstrated greater than 70% left renal artery stenosis, no right renal artery stenosis, the aorta and iliac arteries were patent without significant stenosis. I then crossed the aortic bifurcation and advanced to the left femoral head. Selective left lower extremity angiogram was then performed. This demonstrated occlusion of the left SFA from above the previously placed stent to the proximal popliteal artery.  The vessel  then normalized in the below-knee popliteal artery and had a typical tibial trifurcation.  The anterior tibial artery had a long segment occlusion with reconstitution at the foot.  The peroneal artery was small without high-grade focal stenosis.  The posterior tibial artery had a proximal to mid occlusion but did reconstitute in the mid to distal calf and then had flow into the foot. It was felt that it was in the patient's best interest to proceed with intervention after these images to avoid a second procedure and a larger amount of contrast and fluoroscopy based off  of the findings from the initial angiogram. The patient was systemically heparinized and a 6 [place] Ansell sheath was then placed over the [employer] wire. I then used a Kumpe catheter and the advantage wire to easily navigate through the long SFA occlusion and confirm intraluminal flow in the below-knee popliteal artery.  I then exchanged for a V 18 wire and navigated through the posterior tibial artery occlusion without difficulty with a V 18 wire and the CXI catheter.  Selective imaging of the posterior tibial artery and the foot showed intraluminal flow.  I then replaced the wire and remove the catheter.  A 2.5 mm diameter by 30 cm length angioplasty balloon was inflated to 30 atm from the posterior tibial artery just above its origin all the way down to the distal posterior tibial artery at the ankle.  2 inflations were then performed with a 5 mm diameter by 30 cm length Lutonix drug-coated angioplasty balloon from the origin of the SFA down to the above-knee popliteal artery.  These inflations were 12 atm for 1 minute.  Completion imaging showed areas just above the previously placed stent as well as at the bottom of the previous placed stent and below the previously placed stent with greater than 50% stenosis in the SFA and popliteal arteries.  The posterior tibial artery however was continuous to the foot and was now the dominant runoff with less than 30% stenosis.  The peroneal artery remained patent.  I covered the residual lesions from the mid to distal SFA down to the proximal popliteal artery with a 6 mm diameter by 25 cm length via bond stent postdilated with a 5 mm balloon with less than 10% resultant stenosis after stent placement. I elected to terminate the procedure. The sheath was removed and StarClose closure device was deployed in the right femoral artery with excellent hemostatic result. The patient was taken to the recovery room in stable condition having tolerated the procedure  well.  Findings:               Aortogram:  Greater than 70% left renal artery stenosis, no right renal artery stenosis, the aorta and iliac arteries were patent without significant stenosis.             Left lower Extremity:  Occlusion of the left SFA from above the previously placed stent to the proximal popliteal artery.  The vessel then normalized in the below-knee popliteal artery and had a typical tibial trifurcation.  The anterior tibial artery had a long segment occlusion with reconstitution at the foot.  The peroneal artery was small without high-grade focal stenosis.  The posterior tibial artery had a proximal to mid occlusion but did reconstitute in the mid to distal calf and then had flow into the foot.   Disposition: Patient was taken to the recovery room in stable condition having tolerated the procedure well.  Complications: None  [name]  09/13/2018 2:33 PM   This note was created with Dragon Medical transcription system. Any errors in dictation are purely unintentional.

## 2018-09-13 NOTE — H&P (Signed)
[facility] History & Physical Update  The patient was interviewed and re-examined.  The patient's previous History and Physical has been reviewed and is unchanged.  There is no change in the plan of care. We plan to proceed with the scheduled procedure.  Leotis Pain, MD  09/13/2018, 1:01 PM

## 2018-09-13 NOTE — Progress Notes (Addendum)
[facility] NAME: [name]    MR#:  [id number]  DATE OF BIRTH:  April 29, 1931  SUBJECTIVE:  CHIEF COMPLAINT:   Chief Complaint  Patient presents with  . Fall   No new complaint this morning.  No fevers.  Awaiting angiogram by vascular surgery today.  REVIEW OF SYSTEMS:  Review of Systems  Constitutional: Negative for chills and fever.  HENT: Negative for hearing loss and tinnitus.   Eyes: Negative for blurred vision.  Respiratory: Negative for cough and shortness of breath.   Cardiovascular: Negative for chest pain.  Gastrointestinal: Negative for heartburn and nausea.  Genitourinary: Negative for dysuria and urgency.  Musculoskeletal: Negative for myalgias and neck pain.  Skin: Negative for itching and rash.  Neurological: Negative for dizziness and headaches.  Psychiatric/Behavioral: Negative for depression and hallucinations.    DRUG ALLERGIES:   Allergies  Allergen Reactions  . Contrast Media [Iodinated Diagnostic Agents] Shortness Of Breath  . Morphine And Related Other (See Comments)    Hallucinations   . Niacin And Related Dermatitis  . Other     Other reaction(s): SHORTNESS OF BREATH  . Amlodipine Rash   VITALS:  Blood pressure 117/75, pulse 63, temperature 98.4 F (36.9 C), resp. rate 18, height 5\' 6"  (1.676 m), weight 84.8 kg, SpO2 91 %. PHYSICAL EXAMINATION:  Physical Exam   GENERAL:  85 y.o.-year-old patient lying in the bed with mild conversational dyspnea.  Obese EYES: Pupils equal, round, reactive to light and accommodation. No scleral icterus. Extraocular muscles intact.  HEENT: Head atraumatic, normocephalic. Oropharynx and nasopharynx clear.  NECK:  Supple, no jugular venous distention. No thyroid enlargement, no tenderness.  LUNGS: Bilateral wheezing.  Decreased air entry. CARDIOVASCULAR: S1, S2 normal. No murmurs, rubs, or gallops.  ABDOMEN: Soft, nontender, nondistended. Bowel sounds  present. No organomegaly or mass.  EXTREMITIES:  Bilateral lower extremity edema. left foot dressing NEUROLOGIC: Cranial nerves II through XII are intact. No focal Motor or sensory deficits b/l.   PSYCHIATRIC: The patient is alert and oriented x 3.  SKIN: No obvious rash, lesion, or ulcer.   LABORATORY PANEL:  Male CBC Recent Labs  Lab 09/12/18 0312  WBC 12.6*  HGB 11.9*  HCT 39.1  PLT 217   ------------------------------------------------------------------------------------------------------------------ Chemistries  Recent Labs  Lab 09/11/18 0305  09/13/18 0243  NA 143   < > 138  K 4.3   < > 2.8*  CL 108   < > 103  CO2 27   < > 25  GLUCOSE 147*   < > 339*  BUN 39*   < > 33*  CREATININE 1.58*   < > 1.22  CALCIUM 8.9   < > 8.6*  MG 2.4  --   --    < > = values in this interval not displayed.   RADIOLOGY:  No results found. ASSESSMENT AND PLAN:   Patient is an 12 left male with history of chronic atrial fibrillation,chronic diastolic CHF hypertension,diabetes mellitus,COPD,hyperlipidemia and hypothyroidism who presented to the emergency room after mechanical fall at home. Patient was diagnosed with sepsis secondary to urinary tract infection.  *COPD exacerbation.   Patient already started on IV Solu-Medrol.  Nebulizers as needed.  Inhalers from home.  Oxygen as needed. Clinically improving  *Sepsis secondary to urinary tract infection. Patient on IV antibiotics including cefepime.  Sepsis now resolved.  *Cellulitis of left lower extremity. Podiatry consulted.  Patient reported he was notified by podiatrist that debridement  will be considered after resolution of ongoing acute medical issues. Continue IV antibiotics. Left lower extremity angiogram scheduled for today by vascular surgery  *Chronic atrial fibrillation Rate controlled. Patient on anticoagulation with Eliquis.  On hold for procedure.  Need to restart after that.  *Hypothyroidism  Continue Synthroid. TSH level normal  *Diabetes mellitus type 2 Continued home insulin regimen. Placed on sliding scale insulin coverage.  *Acute kidney injury over CKD stage III. Improving  Hypokalemia Being replaced intravenously since patient is n.p.o.  Requested for magnesium level.  Will replace if low. Follow-up repeat levels  DVT prophylaxis; Eliquis on hold for procedure.  Resume after procedure. Avoiding SCDs due to lower extremity cellulitis with dressing on both lower extremities.    All the records are reviewed and case discussed with Care Management/Social Worker. Management plans discussed with the patient, family and they are in agreement.  CODE STATUS: Full Code  TOTAL TIME TAKING CARE OF THIS PATIENT:34 minutes.   More than 50% of the time was spent in counseling/coordination of care: YES  POSSIBLE D/C IN 2 DAYS, DEPENDING ON CLINICAL CONDITION.   [name] M.D on 09/13/2018 at 11:10 AM  Between 7am to 6pm - Pager - [phone]  After 6pm go to [email or web address] - Proofreader  [facility]  Office  [phone]  CC: Primary care physician; Jearld Fenton, NP  Note: This dictation was prepared with Dragon dictation along with smaller phrase technology. Any transcriptional errors that result from this process are unintentional.

## 2018-09-14 ENCOUNTER — Encounter: Payer: Self-pay | Admitting: Vascular Surgery

## 2018-09-14 LAB — CBC
HCT: 36.4 % — ABNORMAL LOW (ref 39.0–52.0)
Hemoglobin: 12.1 g/dL — ABNORMAL LOW (ref 13.0–17.0)
MCH: 29.2 pg (ref 26.0–34.0)
MCHC: 33.2 g/dL (ref 30.0–36.0)
MCV: 87.9 fL (ref 80.0–100.0)
Platelets: 209 10*3/uL (ref 150–400)
RBC: 4.14 MIL/uL — ABNORMAL LOW (ref 4.22–5.81)
RDW: 16.9 % — ABNORMAL HIGH (ref 11.5–15.5)
WBC: 13.5 10*3/uL — ABNORMAL HIGH (ref 4.0–10.5)
nRBC: 0 % (ref 0.0–0.2)

## 2018-09-14 LAB — BASIC METABOLIC PANEL
Anion gap: 12 (ref 5–15)
BUN: 42 mg/dL — ABNORMAL HIGH (ref 8–23)
CO2: 30 mmol/L (ref 22–32)
Calcium: 8.9 mg/dL (ref 8.9–10.3)
Chloride: 99 mmol/L (ref 98–111)
Creatinine, Ser: 1.24 mg/dL (ref 0.61–1.24)
GFR calc Af Amer: >60 mL/min (ref >60)
GFR calc non Af Amer: 53 mL/min — ABNORMAL LOW (ref >60)
Glucose, Bld: 274 mg/dL — ABNORMAL HIGH (ref 70–99)
Potassium: 2.3 mmol/L — CL (ref 3.5–5.1)
Sodium: 141 mmol/L (ref 135–145)

## 2018-09-14 LAB — GLUCOSE, CAPILLARY
Glucose-Capillary: 198 mg/dL — ABNORMAL HIGH (ref 70–99)
Glucose-Capillary: 294 mg/dL — ABNORMAL HIGH (ref 70–99)
Glucose-Capillary: 346 mg/dL — ABNORMAL HIGH (ref 70–99)
Glucose-Capillary: 354 mg/dL — ABNORMAL HIGH (ref 70–99)

## 2018-09-14 LAB — MAGNESIUM: Magnesium: 2.3 mg/dL (ref 1.7–2.4)

## 2018-09-14 LAB — POTASSIUM: Potassium: 3.2 mmol/L — ABNORMAL LOW (ref 3.5–5.1)

## 2018-09-14 MED ORDER — METHYLPREDNISOLONE SODIUM SUCC 40 MG IJ SOLR
40.0000 mg | Freq: Two times a day (BID) | INTRAMUSCULAR | Status: DC
  Administered 2018-09-14 – 2018-09-15 (×2): 40 mg via INTRAVENOUS
  Filled 2018-09-14 (×2): qty 1

## 2018-09-14 MED ORDER — POTASSIUM CHLORIDE 10 MEQ/100ML IV SOLN: 10.0000 meq | INTRAVENOUS | Status: DC

## 2018-09-14 MED ORDER — POTASSIUM CHLORIDE CRYS ER 20 MEQ PO TBCR
20.0000 meq | EXTENDED_RELEASE_TABLET | ORAL | Status: AC
  Administered 2018-09-14 (×2): 20 meq via ORAL
  Filled 2018-09-14 (×2): qty 1

## 2018-09-14 MED ORDER — POTASSIUM CHLORIDE 10 MEQ/100ML IV SOLN
10.0000 meq | INTRAVENOUS | Status: AC
  Administered 2018-09-14 (×3): 10 meq via INTRAVENOUS
  Filled 2018-09-14 (×3): qty 100

## 2018-09-14 MED ORDER — CHLORHEXIDINE GLUCONATE 4 % EX LIQD
60.0000 mL | Freq: Once | CUTANEOUS | Status: AC
  Administered 2018-09-15: 4 via TOPICAL

## 2018-09-14 MED ORDER — POVIDONE-IODINE 10 % EX SWAB: 2.0000 "application " | Freq: Once | CUTANEOUS | Status: DC

## 2018-09-14 MED ORDER — POTASSIUM CHLORIDE CRYS ER 20 MEQ PO TBCR
20.0000 meq | EXTENDED_RELEASE_TABLET | Freq: Once | ORAL | Status: AC
  Administered 2018-09-15: 20 meq via ORAL
  Filled 2018-09-14: qty 1

## 2018-09-14 MED ORDER — POTASSIUM CHLORIDE CRYS ER 20 MEQ PO TBCR
40.0000 meq | EXTENDED_RELEASE_TABLET | Freq: Once | ORAL | Status: AC
  Administered 2018-09-14: 40 meq via ORAL
  Filled 2018-09-14: qty 2

## 2018-09-14 MED ORDER — ENSURE PRE-SURGERY PO LIQD
296.0000 mL | Freq: Once | ORAL | Status: AC
  Administered 2018-09-14: 296 mL via ORAL
  Filled 2018-09-14: qty 296

## 2018-09-14 MED ORDER — POTASSIUM CHLORIDE 10 MEQ/100ML IV SOLN
10.0000 meq | Freq: Once | INTRAVENOUS | Status: AC
  Administered 2018-09-14: 10 meq via INTRAVENOUS
  Filled 2018-09-14: qty 100

## 2018-09-14 NOTE — Anesthesia Preprocedure Evaluation (Addendum)
Anesthesia Evaluation  Patient identified by MRN, date of birth, ID band Patient awake    Reviewed: Allergy & Precautions, H&P , NPO status , Patient's Chart, lab work & pertinent test results  History of Anesthesia Complications Negative for: history of anesthetic complications  Airway Mallampati: III  TM Distance: >3 FB Neck ROM: Full    Dental  (+) Upper Dentures, Lower Dentures   Pulmonary COPD (intermittent O2 use at home), former smoker,    breath sounds clear to auscultation- rhonchi (-) wheezing      Cardiovascular hypertension, (-) angina+ CAD, + Past MI, + Cardiac Stents and +CHF (chronic diastolic HF)  (-) CABG + dysrhythmias Atrial Fibrillation  Rhythm:Regular Rate:Normal - Systolic murmurs and - Diastolic murmurs Chronic left foot wounds s/p revascularization to left leg on 09/13/18   Neuro/Psych neg Seizures Anxiety CVA    GI/Hepatic Neg liver ROS, GERD  Controlled,  Endo/Other  diabetes, Type 2, Insulin DependentHypothyroidism   Renal/GU Renal InsufficiencyRenal disease     Musculoskeletal  (+) Arthritis ,   Abdominal (+) + obese,   Peds  Hematology negative hematology ROS (+)   Anesthesia Other Findings Hypokalemia, with K 2.7 this AM, awaiting replacement  Past Medical History: No date: Arthritis No date: CAD     Comment:  a. MI 01/29/1996 tx'd w/ TPA @ [facility]; b. Myoview 06/2005:               EF 50%, scar @ apex, mild peri-infarct ischemia No date: Cancer ([place])     Comment:  skin No date: Chronic atrial fibrillation     Comment:  a. since 2006; b. on warfarin No date: Chronic diastolic CHF (congestive heart failure) ([place])     Comment:  a. echo 04/2006: EF lower limits of nl, mod LVH, mild               aortic root dilatation, & mild MR, biatrial enlargement;               b. echo 04/2013: EF 60%, mod dilated LA, mild MR & TR, mod              pulm HTN w/ RV systolic pressure 53, c. echo 04/21/14: EF                55-60%, unable to exclude WMA, severely dilated LA 6.6               cm, nl RVSP, mildly dilated aortic root No date: COPD (chronic obstructive pulmonary disease) ([facility])     Comment:  oxygen prn at home [phone]: CVA     Comment:  x2 No date: DM No date: Falls No date: GERD (gastroesophageal reflux disease) No date: History of kidney stones No date: HYPERLIPIDEMIA No date: HYPERTENSION No date: Kidney stone     Comment:  a. s/p left ureteral stenting 04/24/14 No date: Left arm weakness     Comment:  limited movement. S/P fall injury No date: Neuropathy of both feet No date: Poor balance No date: Wears dentures     Comment:  full upper and lower   Reproductive/Obstetrics negative OB ROS                         Anesthesia Physical Anesthesia Plan  ASA: IV  Anesthesia Plan: General   Post-op Pain Management:    Induction: Intravenous  PONV Risk Score and Plan: 1 and Treatment may vary due to age or  medical condition and Propofol infusion  Airway Management Planned: Natural Airway  Additional Equipment:   Intra-op Plan:   Post-operative Plan:   Informed Consent: I have reviewed the patients History and Physical, chart, labs and discussed the procedure including the risks, benefits and alternatives for the proposed anesthesia with the patient or authorized representative who has indicated his/her understanding and acceptance.     Dental Advisory Given  Plan Discussed with: Anesthesiologist and CRNA  Anesthesia Plan Comments:      Anesthesia Quick Evaluation

## 2018-09-14 NOTE — Progress Notes (Signed)
PT Cancellation Note  Patient Details Name: Reichen Hutzler. MRN: [id number] DOB: 08-Jul-1931   Cancelled Treatment:    Reason Eval/Treat Not Completed: Medical issues which prohibited therapy: Pt's K 2.3 falling below guidelines for participation with PT services.  Will attempt to see pt at a future date/time as medically appropriate.     Linus Salmons PT, DPT 09/14/18, 1:22 PM

## 2018-09-14 NOTE — TOC Initial Note (Signed)
Transition of Care (TOC) - Initial/Assessment Note    Patient Details  Name: [name]. MRN: [id number] Date of Birth: 1931-12-21  Transition of Care [facility]) CM/SW Contact:    Su Hilt, RN Phone Number: 09/14/2018, 11:30 AM  Clinical Narrative:                  Met with the patient to discuss DC plan and needs He lives at home with his wife and his adult children are going to be taking turns staying at night with them. He has a rolling walker and a rolator at home, he has a fall recently and started with [facility] care, I called and notified Lauren with [facility] care that he would like to continue using them, he refuses Rehab and states that he went before and had no benefit from it at all and feels he can do the same at home.  His transportation is provided by adult children, he has Chronic O2 he uses PRN  And CPAP at home already He requested a private pay sitter list, I provided the list to the patient, Lauren with [place] called and we had a discussion about the patient's plan, she accepted the opatient's wish to continue with [facility], I will notify her at [place]  Expected Discharge Plan: [place] Barriers to Discharge: Continued Medical Work up   Patient Goals and CMS Choice Patient states their goals for this hospitalization and ongoing recovery are:: go home with  [place] Medicare.gov Compare Post Acute Care list provided to:: Patient Choice offered to / list presented to : Patient  Expected Discharge Plan and Services Expected Discharge Plan: [place]   Discharge Planning Services: CM Consult Post Acute Care Choice: [place] arrangements for the past 2 months: Single Family Home Expected Discharge Date: 09/14/18                         HH Arranged: PT HH Agency: [place] Date [place]: 09/14/18 Time [place] Agency Contacted: [phone] Representative spoke with at  [place]: Cassie   Prior Living Arrangements/Services Living arrangements for the past 2 months: [place] with:: Spouse, Adult Children Patient language and need for interpreter reviewed:: No Do you feel safe going back to the place where you live?: Yes      Need for Family Participation in Patient Care: No (Comment) Care giver support system in place?: Yes (comment) Current home services: DME, Home PT, Home RN(Rolling walker, rolator) Criminal Activity/Legal Involvement Pertinent to Current Situation/Hospitalization: No - Comment as needed  Activities of Daily Living Home Assistive Devices/Equipment: Environmental consultant (specify type), Wheelchair, Bedside commode/3-in-1, Shower chair with back, CBG Meter, Dentures (specify type) ADL Screening (condition at time of admission) Patient's cognitive ability adequate to safely complete daily activities?: Yes Is the patient deaf or have difficulty hearing?: No Does the patient have difficulty seeing, even when wearing glasses/contacts?: No Does the patient have difficulty concentrating, remembering, or making decisions?: No Patient able to express need for assistance with ADLs?: Yes Does the patient have difficulty dressing or bathing?: Yes Independently performs ADLs?: No Communication: Needs assistance Is this a change from baseline?: Pre-admission baseline Dressing (OT): Needs assistance Is this a change from baseline?: Pre-admission baseline Grooming: Independent Is this a change from baseline?: Pre-admission baseline Feeding: Independent Bathing: Needs assistance Is this a change from baseline?: Pre-admission baseline  Toileting: Needs assistance Is this a change from baseline?: Pre-admission baseline In/Out Bed: Needs assistance Is this a change from baseline?: Pre-admission baseline Walks in Home: Independent with device (comment) Is this a change from baseline?: Pre-admission baseline Does the patient have difficulty walking  or climbing stairs?: Yes Weakness of Legs: None Weakness of Arms/Hands: Both  Permission Sought/Granted Permission sought to share information with : Case Manager Permission granted to share information with : Yes, Verbal Permission Granted              Emotional Assessment Appearance:: Appears stated age Attitude/Demeanor/Rapport: Engaged Affect (typically observed): Accepting Orientation: : Oriented to Self, Oriented to Place, Oriented to  Time, Oriented to Situation Alcohol / Substance Use: Never Used Psych Involvement: No (comment)  Admission diagnosis:  Urinary tract infection without hematuria, site unspecified [N39.0] Sepsis without acute organ dysfunction, due to unspecified organism [facility]) [A41.9] Patient Active Problem List   Diagnosis Date Noted  . Pressure injury of skin 09/11/2018  . Sepsis ([place]) 09/10/2018  . HCAP (healthcare-associated pneumonia) 08/24/2018  . Diabetic infection of left foot ([place]) 06/09/2018  . Acquired hypothyroidism 11/22/2016  . Osteoarthritis, knee 11/22/2016  . Diabetes type 2, controlled ([place]) 06/13/2015  . BPH (benign prostatic hyperplasia) 05/14/2014  . Anxiety 05/14/2014  . Chronic obstructive pulmonary disease ([place]) 05/14/2014  . Chronic diastolic CHF (congestive heart failure) ([place])   . Insomnia 08/07/2013  . Hyperlipidemia 02/26/2009  . MITRAL REGURGITATION 02/26/2009  . Essential hypertension 02/26/2009  . MI 02/26/2009  . Coronary artery disease of native artery of native heart with stable angina pectoris ([place]) 02/26/2009  . Chronic atrial fibrillation 02/26/2009  . CVA (cerebral vascular accident) ([place]) 02/26/2009   PCP:  Jearld Fenton, NP Pharmacy:   [id number] [address], [place] [address] Phone: [phone] Fax: [phone]  [facility] [place], [place] [address] [place] [address] Phone: [phone] Fax: [place], [place] [address] [place] [address] Phone: [phone] Fax: [phone]     Social Determinants of Health (SDOH) Interventions    Readmission Risk Interventions No flowsheet data found.

## 2018-09-14 NOTE — Progress Notes (Signed)
[facility] Daily Progress Note   Subjective: 1 Day Post-Op: 1. Ultrasound guidance for vascular access right femoral artery 2. Catheter placement into left common femoral artery from right femoral approach 3. Aortogram and selective left lower extremity angiogram 4. Percutaneous transluminal angioplasty of left posterior tibial artery with 2.5 mm diameter angioplasty balloon 5.  Percutaneous transluminal angioplasty of the left SFA and proximal popliteal artery with 5 mm diameter by 30 cm length Lutonix drug-coated angioplasty balloon             6.  Via bond stent placement to the left SFA and proximal popliteal artery for multiple areas of greater than 50% stenosis after angioplasty using a 6 mm diameter by 25 cm length stent 7. StarClose closure device right femoral artery  Patient without complaint. No issues overnight.   Objective: Vitals:   09/14/18 0433 09/14/18 0811 09/14/18 1024 09/14/18 1126  BP: 123/75 127/69  (!) 126/57  Pulse: 72 (!) 52 76 88  Resp:  17  16  Temp: 97.6 F (36.4 C) 98.5 F (36.9 C)  97.8 F (36.6 C)  TempSrc: Oral Oral  Oral  SpO2: 100% 100%  93%  Weight:      Height:        Intake/Output Summary (Last 24 hours) at 09/14/2018 1306 Last data filed at 09/14/2018 1148 Gross per 24 hour  Intake 1137.39 ml  Output 2825 ml  Net -1687.61 ml   Physical Exam: A&Ox3, NAD CV: RRR Pulmonary: CTA Bilaterally Abdomen: Soft, Nontender, Nondistended Right Groin: No swelling or drainage noted Vascular:  Left Lower Extremity: thigh soft, calf soft. Warm distally. Podiatry dressing in place.    Laboratory: CBC    Component Value Date/Time   WBC 13.5 (H) 09/14/2018 0348   HGB 12.1 (L) 09/14/2018 0348   HGB 13.3 08/05/2014 1839   HCT 36.4 (L) 09/14/2018 0348   HCT 40.1 08/05/2014 1839   PLT 209 09/14/2018 0348   PLT 246 08/05/2014 1839   BMET     Component Value Date/Time   NA 141 09/14/2018 0348   NA 143 02/21/2015 1108   NA 137 08/05/2014 1839   K 2.3 (LL) 09/14/2018 0348   K 3.6 08/05/2014 1839   CL 99 09/14/2018 0348   CL 106 08/05/2014 1839   CO2 30 09/14/2018 0348   CO2 23 08/05/2014 1839   GLUCOSE 274 (H) 09/14/2018 0348   GLUCOSE 235 (H) 08/05/2014 1839   BUN 42 (H) 09/14/2018 0348   BUN 26 02/21/2015 1108   BUN 19 08/05/2014 1839   CREATININE 1.24 09/14/2018 0348   CREATININE 1.20 08/05/2014 1839   CALCIUM 8.9 09/14/2018 0348   CALCIUM 8.6 (L) 08/05/2014 1839   GFRNONAA 53 (L) 09/14/2018 0348   GFRNONAA 57 (L) 08/05/2014 1839   GFRAA >60 09/14/2018 0348   GFRAA >60 08/05/2014 1839   Assessment/Planning: The patient is an 85 year old male with past medical history of type 2 diabetes, hyperlipidemia, mitral regurgitation, hypertension, coronary artery disease status post MI, chronic atrial fibrillation on Eliquis, CVA in the past, chronic diastolic congestive heart failure, BPH, COPD, hypothyroidism and PAD who presents with a slow healing wound to the left foot, s/p left lower extremity angiogram POD#1 1) successful revascularization of the patient's left lower extremity. 2) wound care as podiatry/wound nurse  Discussed with Dr. Ellis Parents [facility] PA-C 09/14/2018 1:06 PM

## 2018-09-14 NOTE — TOC Progression Note (Signed)
Transition of Care (TOC) - Progression Note    Patient Details  Name: [name]. MRN: [id number] Date of Birth: 1932-04-11  Transition of Care [facility]) CM/SW Contact  Su Hilt, RN Phone Number: 09/14/2018, 9:08 AM  Clinical Narrative:     Amputation is expected Friday Morning, assessment needed to determine needs        Expected Discharge Plan and Services           Expected Discharge Date: 09/14/18                                     Social Determinants of Health (SDOH) Interventions    Readmission Risk Interventions No flowsheet data found.

## 2018-09-14 NOTE — Progress Notes (Signed)
PHARMACY CONSULT NOTE - FOLLOW UP  Pharmacy Consult for Electrolyte Monitoring and Replacement   Recent Labs: Potassium (mmol/L)  Date Value  09/14/2018 3.2 (L)  08/05/2014 3.6   Magnesium (mg/dL)  Date Value  09/14/2018 2.3  04/20/2014 2.1   Calcium (mg/dL)  Date Value  09/14/2018 8.9   Calcium, Total (mg/dL)  Date Value  08/05/2014 8.6 (L)   Albumin (g/dL)  Date Value  08/24/2018 3.0 (L)  05/02/2014 2.4 (L)   Phosphorus (mg/dL)  Date Value  05/13/2016 3.6  04/20/2014 3.0   Sodium (mmol/L)  Date Value  09/14/2018 141  02/21/2015 143  08/05/2014 137     Assessment: Pharmacy has been consulted for electrolyte replacement in this 36 y old male s/p Left Lower Extremity Angiogram being treated for Sepsis from UTI  K=2.3, Mg wnl's  Goal of Therapy:  Electrolytes wnl's  Plan:  Will give 4meq KCl po q2h x 2 and 34meq IV KCl x 4.  Will recheck potassium 1 hour after last IV run per protocol  6/4:  K @ 1619 = 3.2 KCl 40 mEq PO X 1 ordered for ~ 1900. Will order additional KCl 20 mEq PO for 2300 and recheck K on 6/5 with AM labs.   Orene Desanctis ,PharmD Clinical Pharmacist 09/14/2018 7:12 PM

## 2018-09-14 NOTE — Consult Note (Signed)
Daily Progress Note   Subjective  - 1 Day Post-Op  Left foot wound.  Objective Vitals:   09/14/18 0433 09/14/18 0811 09/14/18 1024 09/14/18 1126  BP: 123/75 127/69  (!) 126/57  Pulse: 72 (!) 52 76 88  Resp:  17  16  Temp: 97.6 F (36.4 C) 98.5 F (36.9 C)  97.8 F (36.6 C)  TempSrc: Oral Oral  Oral  SpO2: 100% 100%  93%  Weight:      Height:        Physical Exam: Wound with open bone exposed along the medial first ray previous amputation site.  Some plantar skin flaps are stable at this time.  Wound is deep into the first ray cavity down to second of the tarsal region.  Laboratory CBC    Component Value Date/Time   WBC 13.5 (H) 09/14/2018 0348   HGB 12.1 (L) 09/14/2018 0348   HGB 13.3 08/05/2014 1839   HCT 36.4 (L) 09/14/2018 0348   HCT 40.1 08/05/2014 1839   PLT 209 09/14/2018 0348   PLT 246 08/05/2014 1839    BMET    Component Value Date/Time   NA 141 09/14/2018 0348   NA 143 02/21/2015 1108   NA 137 08/05/2014 1839   K 2.3 (LL) 09/14/2018 0348   K 3.6 08/05/2014 1839   CL 99 09/14/2018 0348   CL 106 08/05/2014 1839   CO2 30 09/14/2018 0348   CO2 23 08/05/2014 1839   GLUCOSE 274 (H) 09/14/2018 0348   GLUCOSE 235 (H) 08/05/2014 1839   BUN 42 (H) 09/14/2018 0348   BUN 26 02/21/2015 1108   BUN 19 08/05/2014 1839   CREATININE 1.24 09/14/2018 0348   CREATININE 1.20 08/05/2014 1839   CALCIUM 8.9 09/14/2018 0348   CALCIUM 8.6 (L) 08/05/2014 1839   GFRNONAA 53 (L) 09/14/2018 0348   GFRNONAA 57 (L) 08/05/2014 1839   GFRAA >60 09/14/2018 0348   GFRAA >60 08/05/2014 1839    Assessment/Planning: Open wound with necrotic bone left foot   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  At this point I recommended delayed primary closure with excision of bone to this left foot.  Patient just underwent revascularization with successful revascularization yesterday.  At this time we will plan for surgery tomorrow.  I discussed this with the patient in  great detail and he is okay and willing to proceed.  I discussed the risk benefits alternatives and complications associated with surgery and consent has been given.    Samara Deist A  09/14/2018, 1:18 PM

## 2018-09-14 NOTE — Consult Note (Signed)
PHARMACY CONSULT NOTE - FOLLOW UP  Pharmacy Consult for Electrolyte Monitoring and Replacement   Recent Labs: Potassium (mmol/L)  Date Value  09/14/2018 2.3 (LL)  08/05/2014 3.6   Magnesium (mg/dL)  Date Value  09/14/2018 2.3  04/20/2014 2.1   Calcium (mg/dL)  Date Value  09/14/2018 8.9   Calcium, Total (mg/dL)  Date Value  08/05/2014 8.6 (L)   Albumin (g/dL)  Date Value  08/24/2018 3.0 (L)  05/02/2014 2.4 (L)   Phosphorus (mg/dL)  Date Value  05/13/2016 3.6  04/20/2014 3.0   Sodium (mmol/L)  Date Value  09/14/2018 141  02/21/2015 143  08/05/2014 137     Assessment: Pharmacy has been consulted for electrolyte replacement in this 39 y old male s/p Left Lower Extremity Angiogram being treated for Sepsis from UTI  K=2.3, Mg wnl's  Goal of Therapy:  Electrolytes wnl's  Plan:  Will give 73meq KCl po q2h x 2 and 84meq IV KCl x 4.  Will recheck potassium 1 hour after last IV run per protocol  Lu Duffel, PharmD, BCPS Clinical Pharmacist 09/14/2018 7:29 AM

## 2018-09-14 NOTE — Progress Notes (Signed)
Inpatient Diabetes Program Recommendations  AACE/ADA: New Consensus Statement on Inpatient Glycemic Control   Target Ranges:  Prepandial:   less than 140 mg/dL      Peak postprandial:   less than 180 mg/dL (1-2 hours)      Critically ill patients:  140 - 180 mg/dL   Results for Shawn, [name]" (MRN [id number]) as of 09/14/2018 08:35  Ref. Range 09/13/2018 07:47 09/13/2018 11:36 09/13/2018 12:23 09/13/2018 14:47 09/13/2018 16:44 09/13/2018 21:08 09/14/2018 08:13  Glucose-Capillary Latest Ref Range: 70 - 99 mg/dL 280 (H) 236 (H) 250 (H) 222 (H) 210 (H) 267 (H) 198 (H)   Review of Glycemic Control  history: DM2 Outpatient Diabetes medications: Levemir 42 units QHS, Huamlog 18 units with breakfast, 3 units with lunch, 10 units with supper Current orders for Inpatient glycemic control: Levemir 25 units QHS, Novolog 0-9 units TID with meals, Novolog 0-5 units QHS; Solumedrol 60 mg Q12H   Inpatient Diabetes Program Recommendations:   Insulin-Basal: If Solumedrol is continued, please consider increasing Levemir to 27 units QHS.  Insulin-Meal Coverage: If Solumedrol is continued, please consider ordering Novolog 3 units TID with meals for meal coverage if patient eats at least 50% of meals.  Thanks, Barnie Alderman, RN, MSN, CDE Diabetes Coordinator Inpatient Diabetes Program [phone] (Team Pager from 8am to 5pm)

## 2018-09-14 NOTE — Progress Notes (Signed)
Repeat potassium 3.2 order for 75meg PO once per Dr. Stark Jock.

## 2018-09-14 NOTE — Progress Notes (Signed)
[place] at [place] NAME: [name]    MR#:  [id number]  DATE OF BIRTH:  04/09/1932  SUBJECTIVE:  CHIEF COMPLAINT:   Chief Complaint  Patient presents with  . Fall   No new complaint this morning.  No fevers.  Resting comfortably.  REVIEW OF SYSTEMS:  Review of Systems  Constitutional: Negative for chills and fever.  HENT: Negative for hearing loss and tinnitus.   Eyes: Negative for blurred vision.  Respiratory: Negative for cough and shortness of breath.   Cardiovascular: Negative for chest pain.  Gastrointestinal: Negative for heartburn and nausea.  Genitourinary: Negative for dysuria and urgency.  Musculoskeletal: Negative for myalgias and neck pain.  Skin: Negative for itching and rash.  Neurological: Negative for dizziness and headaches.  Psychiatric/Behavioral: Negative for depression and hallucinations.    DRUG ALLERGIES:   Allergies  Allergen Reactions  . Contrast Media [Iodinated Diagnostic Agents] Shortness Of Breath  . Morphine And Related Other (See Comments)    Hallucinations   . Niacin And Related Dermatitis  . Other     Other reaction(s): SHORTNESS OF BREATH  . Amlodipine Rash   VITALS:  Blood pressure (!) 126/57, pulse 88, temperature 97.8 F (36.6 C), temperature source Oral, resp. rate 16, height 5\' 6"  (1.676 m), weight 84.8 kg, SpO2 93 %. PHYSICAL EXAMINATION:  Physical Exam   GENERAL:  85 y.o.-year-old patient lying in the bed with mild conversational dyspnea.  Obese EYES: Pupils equal, round, reactive to light and accommodation. No scleral icterus. Extraocular muscles intact.  HEENT: Head atraumatic, normocephalic. Oropharynx and nasopharynx clear.  NECK:  Supple, no jugular venous distention. No thyroid enlargement, no tenderness.  LUNGS: Bilateral wheezing.  Decreased air entry. CARDIOVASCULAR: S1, S2 normal. No murmurs, rubs, or gallops.  ABDOMEN: Soft, nontender, nondistended. Bowel  sounds present. No organomegaly or mass.  EXTREMITIES:  Bilateral lower extremity edema. left foot dressing NEUROLOGIC: Cranial nerves II through XII are intact. No focal Motor or sensory deficits b/l.   PSYCHIATRIC: The patient is alert and oriented x 3.  SKIN: No obvious rash, lesion, or ulcer.   LABORATORY PANEL:  Male CBC Recent Labs  Lab 09/14/18 0348  WBC 13.5*  HGB 12.1*  HCT 36.4*  PLT 209   ------------------------------------------------------------------------------------------------------------------ Chemistries  Recent Labs  Lab 09/14/18 0348  NA 141  K 2.3*  CL 99  CO2 30  GLUCOSE 274*  BUN 42*  CREATININE 1.24  CALCIUM 8.9  MG 2.3   RADIOLOGY:  No results found. ASSESSMENT AND PLAN:   Patient is an 47 left male with history of chronic atrial fibrillation,chronic diastolic CHF hypertension,diabetes mellitus,COPD,hyperlipidemia and hypothyroidism who presented to the emergency room after mechanical fall at home. Patient was diagnosed with sepsis secondary to urinary tract infection.  *COPD exacerbation.   Patient already started on IV Solu-Medrol with gradual taper.  Nebulizers as needed.  Inhalers from home.  Oxygen as needed. Clinically improving  *Sepsis secondary to urinary tract infection. Patient on IV antibiotics including cefepime.  Sepsis now resolved.  *Cellulitis of left lower extremity. Podiatry consulted.  Diagnosed with open wound with necrotic bone left footh.  Podiatrist planning on surgery tomorrow. Continue IV antibiotics. Patient was also seen by vascular surgery and status post balloon angioplasty and left SFA stent placement.  *Chronic atrial fibrillation Rate controlled. Patient on anticoagulation with Eliquis.  On hold for procedure.  Need to restart after that.  *Hypothyroidism Continue Synthroid. TSH level normal  *Diabetes  mellitus type 2 Continued home insulin regimen. Placed on sliding scale  insulin coverage.  *Acute kidney injury over CKD stage III. Improving  Hypokalemia Being replaced.  Follow-up repeat levels   DVT prophylaxis; Eliquis on hold for procedure.  Resume after procedure. Avoiding SCDs due to lower extremity cellulitis with dressing on both lower extremities.    All the records are reviewed and case discussed with Care Management/Social Worker. Management plans discussed with the patient, family and they are in agreement.  CODE STATUS: Full Code  TOTAL TIME TAKING CARE OF THIS PATIENT:35 minutes.   More than 50% of the time was spent in counseling/coordination of care: YES  POSSIBLE D/C IN 2 DAYS, DEPENDING ON CLINICAL CONDITION.   Quilla Freeze M.D on 09/14/2018 at 2:44 PM  Between 7am to 6pm - Pager - [phone]  After 6pm go to [email or web address] - Proofreader  [employer] [facility]  Office  [phone]  CC: Primary care physician; Jearld Fenton, NP  Note: This dictation was prepared with Dragon dictation along with smaller phrase technology. Any transcriptional errors that result from this process are unintentional.

## 2018-09-15 ENCOUNTER — Encounter: Payer: Self-pay | Admitting: Podiatry

## 2018-09-15 ENCOUNTER — Encounter
Admission: EM | Disposition: A | Discharge status: Home Health | Payer: Self-pay | Source: Home / Self Care | Attending: Internal Medicine

## 2018-09-15 ENCOUNTER — Inpatient Hospital Stay: Payer: Medicare Other | Admitting: Anesthesiology

## 2018-09-15 HISTORY — PX: IRRIGATION AND DEBRIDEMENT FOOT: SHX6602

## 2018-09-15 LAB — POTASSIUM: Potassium: 3.1 mmol/L — ABNORMAL LOW (ref 3.5–5.1)

## 2018-09-15 LAB — BASIC METABOLIC PANEL
Anion gap: 12 (ref 5–15)
BUN: 53 mg/dL — ABNORMAL HIGH (ref 8–23)
CO2: 30 mmol/L (ref 22–32)
Calcium: 8.6 mg/dL — ABNORMAL LOW (ref 8.9–10.3)
Chloride: 97 mmol/L — ABNORMAL LOW (ref 98–111)
Creatinine, Ser: 1.3 mg/dL — ABNORMAL HIGH (ref 0.61–1.24)
GFR calc Af Amer: 58 mL/min — ABNORMAL LOW (ref >60)
GFR calc non Af Amer: 50 mL/min — ABNORMAL LOW (ref >60)
Glucose, Bld: 433 mg/dL — ABNORMAL HIGH (ref 70–99)
Potassium: 2.7 mmol/L — CL (ref 3.5–5.1)
Sodium: 139 mmol/L (ref 135–145)

## 2018-09-15 LAB — CBC
HCT: 38 % — ABNORMAL LOW (ref 39.0–52.0)
Hemoglobin: 12.4 g/dL — ABNORMAL LOW (ref 13.0–17.0)
MCH: 29.1 pg (ref 26.0–34.0)
MCHC: 32.6 g/dL (ref 30.0–36.0)
MCV: 89.2 fL (ref 80.0–100.0)
Platelets: 198 10*3/uL (ref 150–400)
RBC: 4.26 MIL/uL (ref 4.22–5.81)
RDW: 16.8 % — ABNORMAL HIGH (ref 11.5–15.5)
WBC: 12.5 10*3/uL — ABNORMAL HIGH (ref 4.0–10.5)
nRBC: 0 % (ref 0.0–0.2)

## 2018-09-15 LAB — GLUCOSE, CAPILLARY
Glucose-Capillary: 297 mg/dL — ABNORMAL HIGH (ref 70–99)
Glucose-Capillary: 272 mg/dL — ABNORMAL HIGH (ref 70–99)
Glucose-Capillary: 220 mg/dL — ABNORMAL HIGH (ref 70–99)
Glucose-Capillary: 184 mg/dL — ABNORMAL HIGH (ref 70–99)
Glucose-Capillary: 154 mg/dL — ABNORMAL HIGH (ref 70–99)
Glucose-Capillary: 228 mg/dL — ABNORMAL HIGH (ref 70–99)

## 2018-09-15 LAB — MRSA PCR SCREENING: MRSA by PCR: NEGATIVE

## 2018-09-15 LAB — MAGNESIUM: Magnesium: 2.4 mg/dL (ref 1.7–2.4)

## 2018-09-15 SURGERY — IRRIGATION AND DEBRIDEMENT FOOT
Anesthesia: General | Site: Foot | Laterality: Left

## 2018-09-15 MED ORDER — ONDANSETRON HCL 4 MG/2ML IJ SOLN: 4.0000 mg | Freq: Once | INTRAMUSCULAR | Status: DC | PRN

## 2018-09-15 MED ORDER — METHYLPREDNISOLONE SODIUM SUCC 40 MG IJ SOLR
20.0000 mg | Freq: Two times a day (BID) | INTRAMUSCULAR | Status: DC
  Administered 2018-09-15: 20 mg via INTRAVENOUS
  Filled 2018-09-15: qty 1

## 2018-09-15 MED ORDER — PROPOFOL 500 MG/50ML IV EMUL
INTRAVENOUS | Status: DC | PRN
  Administered 2018-09-15: 25 ug/kg/min via INTRAVENOUS

## 2018-09-15 MED ORDER — POTASSIUM CHLORIDE CRYS ER 20 MEQ PO TBCR
40.0000 meq | EXTENDED_RELEASE_TABLET | Freq: Once | ORAL | Status: AC
  Administered 2018-09-15: 40 meq via ORAL
  Filled 2018-09-15: qty 2

## 2018-09-15 MED ORDER — POTASSIUM CHLORIDE CRYS ER 20 MEQ PO TBCR: 40.0000 meq | EXTENDED_RELEASE_TABLET | Freq: Once | ORAL | Status: DC

## 2018-09-15 MED ORDER — LIDOCAINE-EPINEPHRINE 1 %-1:100000 IJ SOLN
INTRAMUSCULAR | Status: DC | PRN
  Administered 2018-09-15: 10 mL

## 2018-09-15 MED ORDER — POTASSIUM CHLORIDE 10 MEQ/100ML IV SOLN
10.0000 meq | INTRAVENOUS | Status: AC
  Administered 2018-09-15 (×2): 10 meq via INTRAVENOUS
  Filled 2018-09-15 (×2): qty 100

## 2018-09-15 MED ORDER — PROPOFOL 10 MG/ML IV BOLUS
INTRAVENOUS | Status: DC | PRN
  Administered 2018-09-15 (×2): 20 mg via INTRAVENOUS

## 2018-09-15 MED ORDER — FENTANYL CITRATE (PF) 100 MCG/2ML IJ SOLN: 25.0000 ug | INTRAMUSCULAR | Status: DC | PRN

## 2018-09-15 MED ORDER — LIDOCAINE HCL (PF) 2 % IJ SOLN
INTRAMUSCULAR | Status: AC
  Filled 2018-09-15: qty 10

## 2018-09-15 MED ORDER — BUPIVACAINE HCL 0.25 % IJ SOLN
INTRAMUSCULAR | Status: DC | PRN
  Administered 2018-09-15: 10 mL

## 2018-09-15 MED ORDER — PROPOFOL 500 MG/50ML IV EMUL
INTRAVENOUS | Status: AC
  Filled 2018-09-15: qty 50

## 2018-09-15 MED ORDER — APIXABAN 2.5 MG PO TABS
2.5000 mg | ORAL_TABLET | Freq: Two times a day (BID) | ORAL | Status: DC
  Administered 2018-09-15 – 2018-09-16 (×2): 2.5 mg via ORAL
  Filled 2018-09-15 (×2): qty 1

## 2018-09-15 SURGICAL SUPPLY — 63 items
BANDAGE ACE 4X5 VEL STRL LF (GAUZE/BANDAGES/DRESSINGS) ×2 IMPLANT
BLADE OSC/SAGITTAL MD 5.5X18 (BLADE) ×1 IMPLANT
BLADE OSCILLATING/SAGITTAL (BLADE)
BLADE SW THK.38XMED LNG THN (BLADE) IMPLANT
BNDG COHESIVE 4X5 TAN STRL (GAUZE/BANDAGES/DRESSINGS) ×1 IMPLANT
BNDG COHESIVE 6X5 TAN STRL LF (GAUZE/BANDAGES/DRESSINGS) ×1 IMPLANT
BNDG CONFORM 2 STRL LF (GAUZE/BANDAGES/DRESSINGS) ×2 IMPLANT
BNDG CONFORM 3 STRL LF (GAUZE/BANDAGES/DRESSINGS) ×2 IMPLANT
BNDG ESMARK 4X12 TAN STRL LF (GAUZE/BANDAGES/DRESSINGS) ×1 IMPLANT
BNDG GAUZE 4.5X4.1 6PLY STRL (MISCELLANEOUS) ×2 IMPLANT
CANISTER SUCT 1200ML W/VALVE (MISCELLANEOUS) ×2 IMPLANT
CANISTER SUCT 3000ML PPV (MISCELLANEOUS) ×2 IMPLANT
COVER WAND RF STERILE (DRAPES) ×2 IMPLANT
CUFF TOURN SGL QUICK 12 (TOURNIQUET CUFF) IMPLANT
CUFF TOURN SGL QUICK 18X4 (TOURNIQUET CUFF) IMPLANT
DRAPE FLUOR MINI C-ARM 54X84 (DRAPES) IMPLANT
DRAPE XRAY CASSETTE 23X24 (DRAPES) IMPLANT
DRESSING ALLEVYN 4X4 (MISCELLANEOUS) IMPLANT
DURAPREP 26ML APPLICATOR (WOUND CARE) ×1 IMPLANT
ELECT REM PT RETURN 9FT ADLT (ELECTROSURGICAL) ×2
ELECTRODE REM PT RTRN 9FT ADLT (ELECTROSURGICAL) ×1 IMPLANT
GAUZE PACKING 1/4 X5 YD (GAUZE/BANDAGES/DRESSINGS) ×1 IMPLANT
GAUZE PACKING IODOFORM 1X5 (MISCELLANEOUS) ×2 IMPLANT
GAUZE SPONGE 4X4 12PLY STRL (GAUZE/BANDAGES/DRESSINGS) ×2 IMPLANT
GAUZE XEROFORM 1X8 LF (GAUZE/BANDAGES/DRESSINGS) ×2 IMPLANT
GLOVE BIO SURGEON STRL SZ7.5 (GLOVE) ×2 IMPLANT
GLOVE INDICATOR 8.0 STRL GRN (GLOVE) ×2 IMPLANT
GOWN STRL REUS W/ TWL LRG LVL3 (GOWN DISPOSABLE) ×2 IMPLANT
GOWN STRL REUS W/TWL LRG LVL3 (GOWN DISPOSABLE) ×4
GOWN STRL REUS W/TWL MED LVL3 (GOWN DISPOSABLE) ×4 IMPLANT
HANDPIECE VERSAJET DEBRIDEMENT (MISCELLANEOUS) ×1 IMPLANT
IV NS 1000ML (IV SOLUTION) ×2
IV NS 1000ML BAXH (IV SOLUTION) ×1 IMPLANT
KIT TURNOVER KIT A (KITS) ×2 IMPLANT
LABEL OR SOLS (LABEL) ×2 IMPLANT
NDL FILTER BLUNT 18X1 1/2 (NEEDLE) ×1 IMPLANT
NDL HYPO 25X1 1.5 SAFETY (NEEDLE) ×1 IMPLANT
NEEDLE FILTER BLUNT 18X 1/2SAF (NEEDLE) ×1
NEEDLE FILTER BLUNT 18X1 1/2 (NEEDLE) ×1 IMPLANT
NEEDLE HYPO 25X1 1.5 SAFETY (NEEDLE) ×2 IMPLANT
NS IRRIG 500ML POUR BTL (IV SOLUTION) ×2 IMPLANT
PACK EXTREMITY ARMC (MISCELLANEOUS) ×2 IMPLANT
PAD ABD DERMACEA PRESS 5X9 (GAUZE/BANDAGES/DRESSINGS) ×1 IMPLANT
PULSAVAC PLUS IRRIG FAN TIP (DISPOSABLE)
RASP SM TEAR CROSS CUT (RASP) IMPLANT
SHIELD FULL FACE ANTIFOG 7M (MISCELLANEOUS) ×2 IMPLANT
SOL .9 NS 3000ML IRR  AL (IV SOLUTION) ×1
SOL .9 NS 3000ML IRR AL (IV SOLUTION) ×1
SOL .9 NS 3000ML IRR UROMATIC (IV SOLUTION) ×1 IMPLANT
SOL PREP PVP 2OZ (MISCELLANEOUS) ×2
SOLUTION PREP PVP 2OZ (MISCELLANEOUS) ×1 IMPLANT
STOCKINETTE IMPERVIOUS 9X36 MD (GAUZE/BANDAGES/DRESSINGS) ×2 IMPLANT
SUT ETHILON 2 0 FS 18 (SUTURE) ×3 IMPLANT
SUT ETHILON 4-0 (SUTURE)
SUT ETHILON 4-0 FS2 18XMFL BLK (SUTURE)
SUT VIC AB 3-0 SH 27 (SUTURE) ×2
SUT VIC AB 3-0 SH 27X BRD (SUTURE) ×1 IMPLANT
SUT VIC AB 4-0 FS2 27 (SUTURE) ×2 IMPLANT
SUTURE ETHLN 4-0 FS2 18XMF BLK (SUTURE) ×1 IMPLANT
SWAB CULTURE AMIES ANAERIB BLU (MISCELLANEOUS) IMPLANT
SYR 10ML LL (SYRINGE) ×4 IMPLANT
SYR 3ML LL SCALE MARK (SYRINGE) ×2 IMPLANT
TIP FAN IRRIG PULSAVAC PLUS (DISPOSABLE) ×1 IMPLANT

## 2018-09-15 NOTE — Progress Notes (Signed)
Pharmacy Antibiotic Note  [name]. is a 85 y.o. male admitted on 09/10/2018.  Pharmacy has been consulted for vancomycin (now discontinued) and cefepime dosing for sepsis.  Plan:  Continue Cefepime 2 g IV q12h    Height: 5\' 6"  (167.6 cm) Weight: 187 lb (84.8 kg) IBW/kg (Calculated) : 63.8  Temp (24hrs), Avg:97.5 F (36.4 C), Min:96 F (35.6 C), Max:98 F (36.7 C)  Recent Labs  Lab 09/10/18 1041 09/10/18 1213 09/10/18 1542 09/11/18 0305 09/12/18 0312 09/13/18 0243 09/14/18 0348 09/15/18 0326  WBC 17.1*  --   --  12.2* 12.6*  --  13.5* 12.5*  CREATININE 1.82*  --   --  1.58* 1.37* 1.22 1.24 1.30*  LATICACIDVEN  --  3.2* 1.7  --   --   --   --   --     Estimated Creatinine Clearance: 43.2 mL/min (A) (by C-G formula based on SCr of 1.3 mg/dL (H)).    Allergies  Allergen Reactions  . Contrast Media [Iodinated Diagnostic Agents] Shortness Of Breath  . Morphine And Related Other (See Comments)    Hallucinations   . Niacin And Related Dermatitis  . Other     Other reaction(s): SHORTNESS OF BREATH  . Amlodipine Rash    Antimicrobials this admission: Ceftriaxone 5/31 x 1 Vancomycin 5/31 >>6/5 Cefepime 5/31 >>  Dose adjustments this admission: As outlined above  Microbiology results: Thank you for allowing pharmacy to be a part of this patient's care.  Lu Duffel, PharmD, BCPS Clinical Pharmacist 09/15/2018 2:49 PM

## 2018-09-15 NOTE — Op Note (Signed)
Operative note   Surgeon:Azharia Surratt Lawyer: None    Preop diagnosis: Chronic wound left foot with dehiscence and exposed first metatarsal    Postop diagnosis: Same    Procedure: Delayed primary closure left foot and excision first metatarsal left foot    EBL: Minimal    Anesthesia:local and IV sedation.  Local consisted of a one-to-one mixture of 0.25% bupivacaine and 1% lidocaine with epinephrine.  A total of 20 cc was used    Hemostasis: Epinephrine infiltrated along the incision site    Specimen: None    Complications: None    Operative indications:[name]. is an 85 y.o. that presents today for surgical intervention.  The risks/benefits/alternatives/complications have been discussed and consent has been given.    Procedure:  Patient was brought into the OR and placed on the operating table in thesupine position. After anesthesia was obtained theleft lower extremity was prepped and draped in usual sterile fashion.  Attention was directed to the medial aspect of the left foot where the previous incision was noted.  Open dehiscence of the wound was noted with exposed first metatarsal.  This time the wound was initially irrigated and the proximal aspect of the wound was then opened with a 15 blade.  This exposed the residual metatarsal base region.  In a beveled cut a small amount of the first metatarsal was then excised to allow for primary closure of the wound.  The wound was then debrided excisionally down to bone with a versa jet.  All nonviable tissue was then removed.  No signs of any obvious infection.  The wound was clean and healthy in appearance.  This time layered closure was performed with a 3-0 Vicryl the deeper and subcutaneous tissue and a 2-0 nylon for the skin.  A small skin flap was created distally to assist with primary closure.  Complete closure of the wound was performed.  A bulky padded sterile dressing was applied.  A posterior heel early stage  decubitus ulcer was noted in a posterior heel pad was applied.      Patient tolerated the procedure and anesthesia well.  Was transported from the OR to the PACU with all vital signs stable and vascular status intact. To be discharged per routine protocol.  Will follow up in approximately 1 week in the outpatient clinic.

## 2018-09-15 NOTE — Anesthesia Post-op Follow-up Note (Signed)
Anesthesia QCDR form completed.        

## 2018-09-15 NOTE — Progress Notes (Signed)
Left foot toes cool to touch

## 2018-09-15 NOTE — Anesthesia Postprocedure Evaluation (Signed)
Anesthesia Post Note  Patient: [name].  Procedure(s) Performed: IRRIGATION AND DEBRIDEMENT FOOT DELAY CLOSURE (Left Foot)  Patient location during evaluation: PACU Anesthesia Type: General Level of consciousness: awake and alert and oriented Pain management: pain level controlled Vital Signs Assessment: post-procedure vital signs reviewed and stable Respiratory status: spontaneous breathing, nonlabored ventilation and respiratory function stable Cardiovascular status: blood pressure returned to baseline and stable Postop Assessment: no signs of nausea or vomiting Anesthetic complications: no     Last Vitals:  Vitals:   09/15/18 1413 09/15/18 1414  BP: (!) 151/61 (!) 151/61  Pulse:  (!) 44  Resp:  16  Temp:    SpO2:  100%    Last Pain:  Vitals:   09/15/18 1354  TempSrc:   PainSc: 0-No pain                 [name]

## 2018-09-15 NOTE — Progress Notes (Addendum)
PHARMACY CONSULT NOTE - FOLLOW UP  Pharmacy Consult for Electrolyte Monitoring and Replacement   Recent Labs: Potassium (mmol/L)  Date Value  09/15/2018 3.1 (L)  08/05/2014 3.6   Magnesium (mg/dL)  Date Value  09/15/2018 2.4  04/20/2014 2.1   Calcium (mg/dL)  Date Value  09/15/2018 8.6 (L)   Calcium, Total (mg/dL)  Date Value  08/05/2014 8.6 (L)   Albumin (g/dL)  Date Value  08/24/2018 3.0 (L)  05/02/2014 2.4 (L)   Phosphorus (mg/dL)  Date Value  05/13/2016 3.6  04/20/2014 3.0   Sodium (mmol/L)  Date Value  09/15/2018 139  02/21/2015 143  08/05/2014 137     Assessment: Pharmacy has been consulted for electrolyte replacement in this 55 y old male s/p Left Lower Extremity Angiogram being treated for Sepsis from UTI  K=2.7, Mg wnl's  Pt on IV Lasix 60mg  bid  Goal of Therapy:  Electrolytes wnl's  Plan:  Physician ordered KCl IV 10 meq x 2, 40 meq KCl po - rechecking K @ 1100 - returned K = 3.1 - Pt received another 55meq po  Will recheck electrolytes with am labs  Lu Duffel, PharmD, BCPS Clinical Pharmacist 09/15/2018 7:43 AM

## 2018-09-15 NOTE — Progress Notes (Signed)
PT Cancellation Note  Patient Details Name: [name]. MRN: [id number] DOB: 1931-08-17   Cancelled Treatment:    Reason Eval/Treat Not Completed: Patient at procedure or test/unavailable(Patient currently off unit for irrigation/debridement and delayed closure of L foot wound.  Will hold this date and re-attempt next date (as new/continue orders received).)   Raine Elsass H. Owens Shark, PT, DPT, NCS 09/15/18, 12:09 PM [phone]

## 2018-09-15 NOTE — Progress Notes (Signed)
[facility] [place] NAME: [name]    MR#:  [id number]  DATE OF BIRTH:  09/09/1931  SUBJECTIVE:  CHIEF COMPLAINT:   Chief Complaint  Patient presents with  . Fall   No new complaint this morning.  No fevers.  Resting comfortably.  REVIEW OF SYSTEMS:  Review of Systems  Constitutional: Negative for chills and fever.  HENT: Negative for hearing loss and tinnitus.   Eyes: Negative for blurred vision.  Respiratory: Negative for cough and shortness of breath.   Cardiovascular: Negative for chest pain.  Gastrointestinal: Negative for heartburn and nausea.  Genitourinary: Negative for dysuria and urgency.  Musculoskeletal: Negative for myalgias and neck pain.  Skin: Negative for itching and rash.  Neurological: Negative for dizziness and headaches.  Psychiatric/Behavioral: Negative for depression and hallucinations.    DRUG ALLERGIES:   Allergies  Allergen Reactions  . Contrast Media [Iodinated Diagnostic Agents] Shortness Of Breath  . Morphine And Related Other (See Comments)    Hallucinations   . Niacin And Related Dermatitis  . Other     Other reaction(s): SHORTNESS OF BREATH  . Amlodipine Rash   VITALS:  Blood pressure (!) 149/68, pulse (!) 45, temperature (!) 97.5 F (36.4 C), temperature source Oral, resp. rate 16, height 5\' 6"  (1.676 m), weight 84.8 kg, SpO2 (!) 71 %. PHYSICAL EXAMINATION:  Physical Exam   GENERAL:  84 y.o.-year-old patient lying in the bed with mild conversational dyspnea.  Obese EYES: Pupils equal, round, reactive to light and accommodation. No scleral icterus. Extraocular muscles intact.  HEENT: Head atraumatic, normocephalic. Oropharynx and nasopharynx clear.  NECK:  Supple, no jugular venous distention. No thyroid enlargement, no tenderness.  LUNGS: Bilateral wheezing.  Decreased air entry. CARDIOVASCULAR: S1, S2 normal. No murmurs, rubs, or gallops.  ABDOMEN: Soft, nontender, nondistended.  Bowel sounds present. No organomegaly or mass.  EXTREMITIES:  Bilateral lower extremity edema. left foot dressing NEUROLOGIC: Cranial nerves II through XII are intact. No focal Motor or sensory deficits b/l.   PSYCHIATRIC: The patient is alert and oriented x 3.  SKIN: No obvious rash, lesion, or ulcer.   LABORATORY PANEL:  Male CBC Recent Labs  Lab 09/15/18 0326  WBC 12.5*  HGB 12.4*  HCT 38.0*  PLT 198   ------------------------------------------------------------------------------------------------------------------ Chemistries  Recent Labs  Lab 09/15/18 0326 09/15/18 1106  NA 139  --   K 2.7* 3.1*  CL 97*  --   CO2 30  --   GLUCOSE 433*  --   BUN 53*  --   CREATININE 1.30*  --   CALCIUM 8.6*  --   MG 2.4  --    RADIOLOGY:  No results found. ASSESSMENT AND PLAN:   Patient is an 9 left male with history of chronic atrial fibrillation,chronic diastolic CHF hypertension,diabetes mellitus,COPD,hyperlipidemia and hypothyroidism who presented to the emergency room after mechanical fall at home. Patient was diagnosed with sepsis secondary to urinary tract infection.  *COPD exacerbation.   Patient already started on IV Solu-Medrol with gradual taper.  Taper and IV Solu-Medrol further today to 20 mg every 12 hourly.  Nebulizers as needed.  Inhalers from home.  Oxygen as needed. Clinically improving  *Sepsis secondary to urinary tract infection. Patient on IV antibiotics including cefepime.  Sepsis now resolved.  *Cellulitis of left lower extremity. Podiatry consulted.  Diagnosed with open wound with necrotic bone left footh.  Podiatrist planning on surgery today.  Continue IV antibiotics. Patient was also seen  by vascular surgery and status post balloon angioplasty and left SFA stent placement.  *Chronic atrial fibrillation Rate controlled. Patient on anticoagulation with Eliquis.  On hold for procedure.  Need to restart after that.  *Hypothyroidism  Continue Synthroid. TSH level normal  *Diabetes mellitus type 2 Blood sugars uncontrolled this morning.  Managed blood sugars with sliding scale insulin coverage since patient currently n.p.o. Please see further recommendations from diabetic nurse coordinator which can be initiated after surgery and patient no longer n.p.o. and eating well Continue sliding scale insulin coverage.  *Acute kidney injury over CKD stage III. Improving  Hypokalemia Being replaced.  Follow-up repeat levels   DVT prophylaxis; Eliquis on hold for procedure.  Resume after procedure. Avoiding SCDs due to lower extremity cellulitis with dressing on both lower extremities.    All the records are reviewed and case discussed with Care Management/Social Worker. Management plans discussed with the patient, family and they are in agreement.  CODE STATUS: Full Code  TOTAL TIME TAKING CARE OF THIS PATIENT:36 minutes.   More than 50% of the time was spent in counseling/coordination of care: YES  POSSIBLE D/C IN 2 DAYS, DEPENDING ON CLINICAL CONDITION.   [name] M.D on 09/15/2018 at 12:01 PM  Between 7am to 6pm - Pager - [phone]  After 6pm go to [email or web address] - Proofreader  [facility]  Office  [phone]  CC: Primary care physician; Shawn Fenton, NP  Note: This dictation was prepared with Dragon dictation along with smaller phrase technology. Any transcriptional errors that result from this process are unintentional.

## 2018-09-15 NOTE — Transfer of Care (Signed)
Immediate Anesthesia Transfer of Care Note  Patient: [name].  Procedure(s) Performed: IRRIGATION AND DEBRIDEMENT FOOT DELAY CLOSURE (Left Foot)  Patient Location: PACU  Anesthesia Type:General  Level of Consciousness: awake, alert  and oriented  Airway & Oxygen Therapy: Patient Spontanous Breathing and Patient connected to nasal cannula oxygen  Post-op Assessment: Report given to RN and Post -op Vital signs reviewed and stable  Post vital signs: Reviewed and stable  Last Vitals:  Vitals Value Taken Time  BP 141/67 09/15/2018  1:25 PM  Temp 36.5 C 09/15/2018  1:25 PM  Pulse 52 09/15/2018  1:27 PM  Resp 17 09/15/2018  1:27 PM  SpO2 100 % 09/15/2018  1:27 PM  Vitals shown include unvalidated device data.  Last Pain:  Vitals:   09/15/18 1325  TempSrc: Temporal  PainSc:       Patients Stated Pain Goal: 2 (21/94/71 2527)  Complications: No apparent anesthesia complications

## 2018-09-15 NOTE — Progress Notes (Signed)
Spoke with anes regarding patients lab work this AM. K is 2.7 and glucose is 433. Dr. Ola Spurr stated follow up with primary or Dr. Vickki Muff needed regarding these lab work PRIOR to going to surgery. RN to notify floor RN.

## 2018-09-15 NOTE — Progress Notes (Signed)
Inpatient Diabetes Program Recommendations  AACE/ADA: New Consensus Statement on Inpatient Glycemic Control  Target Ranges:  Prepandial:   less than 140 mg/dL      Peak postprandial:   less than 180 mg/dL (1-2 hours)      Critically ill patients:  140 - 180 mg/dL   Results for KAYLOR, SIMENSON" (MRN [id number]) as of 09/15/2018 09:32  Ref. Range 09/14/2018 08:13 09/14/2018 11:27 09/14/2018 16:47 09/14/2018 21:04 09/15/2018 07:33  Glucose-Capillary Latest Ref Range: 70 - 99 mg/dL 198 (H) 294 (H) 346 (H) 354 (H) 297 (H)    Review of Glycemic Control  Diabetes history:  DM2 Outpatient Diabetes medications:Levemir 42 units QHS, Huamlog 18 units with breakfast, 3 units with lunch, 10 units with supper Current orders for Inpatient glycemic control:Levemir 25 units QHS, Novolog 0-9 units TID with meals, Novolog 0-5 units QHS; Solumedrol 40 mg Q12H   Inpatient Diabetes Program Recommendations:   Insulin-Basal: If Solumedrol is continued, please consider increasing Levemir to 30 units QHS.  Insulin-Meal Coverage: If Solumedrol is continued, please consider ordering Novolog 3 units TID with meals for meal coverage if patient eats at least 50% of meals.  Thanks, Barnie Alderman, RN, MSN, CDE Diabetes Coordinator Inpatient Diabetes Program [phone] (Team Pager from 8am to 5pm)

## 2018-09-15 NOTE — Anesthesia Procedure Notes (Signed)
Date/Time: 09/15/2018 12:20 PM Performed by: Johnna Acosta, CRNA Pre-anesthesia Checklist: Patient identified, Emergency Drugs available, Suction available, Patient being monitored and Timeout performed Patient Re-evaluated:Patient Re-evaluated prior to induction Oxygen Delivery Method: Simple face mask Preoxygenation: Pre-oxygenation with 100% oxygen Induction Type: IV induction

## 2018-09-15 NOTE — Care Management Important Message (Signed)
Important Message  Patient Details  Name: [name]. MRN: [id number] Date of Birth: [birth year]   Medicare Important Message Given:  Yes    Juliann Pulse A Jaydy Fitzhenry 09/15/2018, 11:33 AM

## 2018-09-16 ENCOUNTER — Other Ambulatory Visit: Payer: Self-pay | Admitting: Cardiovascular Disease

## 2018-09-16 LAB — BASIC METABOLIC PANEL
Anion gap: 9 (ref 5–15)
BUN: 42 mg/dL — ABNORMAL HIGH (ref 8–23)
CO2: 29 mmol/L (ref 22–32)
Calcium: 8.7 mg/dL — ABNORMAL LOW (ref 8.9–10.3)
Chloride: 104 mmol/L (ref 98–111)
Creatinine, Ser: 1.13 mg/dL (ref 0.61–1.24)
GFR calc Af Amer: >60 mL/min (ref >60)
GFR calc non Af Amer: 59 mL/min — ABNORMAL LOW (ref >60)
Glucose, Bld: 183 mg/dL — ABNORMAL HIGH (ref 70–99)
Potassium: 3.2 mmol/L — ABNORMAL LOW (ref 3.5–5.1)
Sodium: 142 mmol/L (ref 135–145)

## 2018-09-16 LAB — CBC
HCT: 38.3 % — ABNORMAL LOW (ref 39.0–52.0)
Hemoglobin: 12.2 g/dL — ABNORMAL LOW (ref 13.0–17.0)
MCH: 28.8 pg (ref 26.0–34.0)
MCHC: 31.9 g/dL (ref 30.0–36.0)
MCV: 90.5 fL (ref 80.0–100.0)
Platelets: 200 10*3/uL (ref 150–400)
RBC: 4.23 MIL/uL (ref 4.22–5.81)
RDW: 16.5 % — ABNORMAL HIGH (ref 11.5–15.5)
WBC: 14 10*3/uL — ABNORMAL HIGH (ref 4.0–10.5)
nRBC: 0 % (ref 0.0–0.2)

## 2018-09-16 LAB — MAGNESIUM: Magnesium: 2.5 mg/dL — ABNORMAL HIGH (ref 1.7–2.4)

## 2018-09-16 LAB — GLUCOSE, CAPILLARY
Glucose-Capillary: 118 mg/dL — ABNORMAL HIGH (ref 70–99)
Glucose-Capillary: 159 mg/dL — ABNORMAL HIGH (ref 70–99)

## 2018-09-16 MED ORDER — AMOXICILLIN-POT CLAVULANATE 875-125 MG PO TABS: 1.0000 | ORAL_TABLET | Freq: Two times a day (BID) | ORAL | 0 refills | Status: AC

## 2018-09-16 MED ORDER — POTASSIUM CHLORIDE CRYS ER 20 MEQ PO TBCR
40.0000 meq | EXTENDED_RELEASE_TABLET | ORAL | Status: AC
  Administered 2018-09-16 (×2): 40 meq via ORAL
  Filled 2018-09-16 (×2): qty 2

## 2018-09-16 MED ORDER — PREDNISONE 20 MG PO TABS
20.0000 mg | ORAL_TABLET | Freq: Every day | ORAL | Status: DC
  Administered 2018-09-16: 20 mg via ORAL
  Filled 2018-09-16: qty 1

## 2018-09-16 NOTE — Progress Notes (Signed)
Daily Progress Note   Subjective  - 1 Day Post-Op  F/u left foot debridement and primary closure  Objective Vitals:   09/15/18 1430 09/15/18 1547 09/15/18 2318 09/16/18 0748  BP: (!) 148/86 (!) 159/81 (!) 155/85 140/64  Pulse: (!) 59 78 (!) 50 61  Resp: 16 17 19 20   Temp: (!) 97.4 F (36.3 C) 97.7 F (36.5 C) 97.8 F (36.6 C) 97.8 F (36.6 C)  TempSrc: Oral Oral Oral Oral  SpO2: 100% 96% 99% 93%  Weight:      Height:        Physical Exam: Wound is closed.  Mild maceration with bloody drainage on bandage.  No purulence.  Laboratory CBC    Component Value Date/Time   WBC 14.0 (H) 09/16/2018 0530   HGB 12.2 (L) 09/16/2018 0530   HGB 13.3 08/05/2014 1839   HCT 38.3 (L) 09/16/2018 0530   HCT 40.1 08/05/2014 1839   PLT 200 09/16/2018 0530   PLT 246 08/05/2014 1839    BMET    Component Value Date/Time   NA 142 09/16/2018 0530   NA 143 02/21/2015 1108   NA 137 08/05/2014 1839   K 3.2 (L) 09/16/2018 0530   K 3.6 08/05/2014 1839   CL 104 09/16/2018 0530   CL 106 08/05/2014 1839   CO2 29 09/16/2018 0530   CO2 23 08/05/2014 1839   GLUCOSE 183 (H) 09/16/2018 0530   GLUCOSE 235 (H) 08/05/2014 1839   BUN 42 (H) 09/16/2018 0530   BUN 26 02/21/2015 1108   BUN 19 08/05/2014 1839   CREATININE 1.13 09/16/2018 0530   CREATININE 1.20 08/05/2014 1839   CALCIUM 8.7 (L) 09/16/2018 0530   CALCIUM 8.6 (L) 08/05/2014 1839   GFRNONAA 59 (L) 09/16/2018 0530   GFRNONAA 57 (L) 08/05/2014 1839   GFRAA >60 09/16/2018 0530   GFRAA >60 08/05/2014 1839    Assessment/Planning: lef tfoot ulcer s/p bone excision and primary closure   Dressing changed.  PT consulted.  Pt needs minimal wb to left foot (heel only for transfer.)  Otherwise remain NWB.  Will need 3 x's per week dry dressing changes as well.  Once medically stable ok from podiatry standpoint to d/c and f/u with podiatry in outpt clinic in 1-2 weeks.    Samara Deist A  09/16/2018, 10:47 AM

## 2018-09-16 NOTE — Plan of Care (Signed)
Pt discharged home, IV sites DC, bleeding controlled, wound care performed this shift.  He walked in his room with PT and sat in his chair all morning, scrip for augmentin sent to pt's  pharmacy by MD, his daughter will pick him up, DC instructions given, understanding verbalized

## 2018-09-16 NOTE — Evaluation (Signed)
Physical Therapy Evaluation Patient Details Name: [name]. MRN: [id number] DOB: 1931-04-27 Today's Date: 09/16/2018   History of Present Illness  presented to ER secondary to mechanical fall in home environment; admitted for management of sepsis related to UTI, L LE cellullitis.  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 Hospital stay complicated by L foot closure and dehiscence of wound 09/15/2018.   Clinical Impression  Pt is a pleasant 85 year old male who was admitted for sepsis and non healing wounds, recently had procedure for closure 6/5, new orders for PT resumption received. Re-eval performed. Pt performs bed mobility with mod I, transfers with cga, and ambulation with cga and RW. Post op shoes donned prior to any mobility with pt able to maintain correct L heel WB status during ambulation. Safe technique with RW. Pt demonstrates deficits with strength/mobility/balance. Pt very motivated to go home and has hired assistance and safe home environment. Would benefit from skilled PT to address above deficits and promote optimal return to PLOF. Recommend transition to [facility] upon discharge from acute hospitalization.       Follow Up Recommendations Home health PT;Supervision/Assistance - 24 hour    Equipment Recommendations  Rolling walker with 5" wheels(needs new RW!!)    Recommendations for Other Services       Precautions / Restrictions Precautions Precautions: Fall Precaution Comments: R post op shoe, L heel wedge shoe Restrictions Weight Bearing Restrictions: Yes LLE Weight Bearing: Non weight bearing Other Position/Activity Restrictions: heel WBing      Mobility  Bed Mobility Overal bed mobility: Modified Independent             General bed mobility comments: safe. takes additional time to scoot out towards EOB  Transfers Overall transfer level: Needs  assistance Equipment used: Rolling walker (2 wheeled) Transfers: Sit to/from Stand Sit to Stand: Min guard         General transfer comment: cues for hand placement. Once standing, no LOB noted. Upright posture  Ambulation/Gait Ambulation/Gait assistance: Min guard Gait Distance (Feet): 40 Feet Assistive device: Rolling walker (2 wheeled) Gait Pattern/deviations: Step-through pattern     General Gait Details: ambulated in room (approx distance at home), reciprocal gait with cues to initiate stepping with L foot. Heel wedge shoe and post op shoe donned. Sats on RA WNL  Stairs            Wheelchair Mobility    Modified Rankin (Stroke Patients Only)       Balance Overall balance assessment: Needs assistance;History of Falls Sitting-balance support: No upper extremity supported Sitting balance-Leahy Scale: Good     Standing balance support: Bilateral upper extremity supported Standing balance-Leahy Scale: Good                               Pertinent Vitals/Pain Pain Assessment: No/denies pain    Home Living Family/patient expects to be discharged to:: Private residence Living Arrangements: Spouse/significant other Available Help at Discharge: Family(hired 24/7 care) Type of Home: House Home Access: Ramped entrance(just got ramp built yesterday)     Home Layout: One level Home Equipment: Environmental consultant - 2 wheels;Walker - 4 wheels;Cane - single point;Bedside commode;Shower seat;Shower seat - built in;Grab bars - toilet;Grab bars - tub/shower;Wheelchair - manual;Hospital bed;Other (comment) Additional Comments: will have family support in addition to hired 24/7 caregiver    Prior Function Level of Independence: Needs assistance  Gait / Transfers Assistance Needed: Pt ambulates in home with rollator vs front wheeled walker. He reports at least 6 falls in the last 12 months, most recent related to this admission  ADL's / Homemaking Assistance Needed: Pt  states that he requires assistance with ADLs/IADLs from wife and niece/dtr  Comments: Modified independent with ambulation using rollator.  H/o 1 fall (pt reports impaired balance for years).     Hand Dominance   Dominant Hand: Right    Extremity/Trunk Assessment   Upper Extremity Assessment Upper Extremity Assessment: (decreased B shoulder AROM)    Lower Extremity Assessment Lower Extremity Assessment: Generalized weakness(B LE grossly 4/5)       Communication   Communication: No difficulties  Cognition Arousal/Alertness: Awake/alert Behavior During Therapy: WFL for tasks assessed/performed Overall Cognitive Status: Within Functional Limits for tasks assessed                                        General Comments      Exercises Other Exercises Other Exercises: supine/seated ther-ex performed with rest breaks as needed. SLR, hip abd/add, LAQ, alt. marching x 10 reps with cues for technique.   Assessment/Plan    PT Assessment Patient needs continued PT services  PT Problem List Decreased strength;Decreased range of motion;Decreased activity tolerance;Decreased balance;Decreased mobility;Decreased coordination;Decreased knowledge of use of DME;Decreased knowledge of precautions;Cardiopulmonary status limiting activity;Decreased skin integrity;Pain       PT Treatment Interventions DME instruction;Gait training;Stair training;Therapeutic activities;Balance training;Therapeutic exercise;Neuromuscular re-education;Patient/family education    PT Goals (Current goals can be found in the Care Plan section)  Acute Rehab PT Goals Patient Stated Goal: to get stronger PT Goal Formulation: With patient Time For Goal Achievement: 09/26/18 Potential to Achieve Goals: Good    Frequency Min 2X/week   Barriers to discharge        Co-evaluation               AM-PAC PT "6 Clicks" Mobility  Outcome Measure Help needed turning from your back to your side  while in a flat bed without using bedrails?: None Help needed moving from lying on your back to sitting on the side of a flat bed without using bedrails?: None Help needed moving to and from a bed to a chair (including a wheelchair)?: A Little Help needed standing up from a chair using your arms (e.g., wheelchair or bedside chair)?: A Little Help needed to walk in hospital room?: A Little Help needed climbing 3-5 steps with a railing? : A Lot 6 Click Score: 19    End of Session Equipment Utilized During Treatment: Gait belt Activity Tolerance: Patient tolerated treatment well Patient left: in chair;with call bell/phone within reach;with chair alarm set Nurse Communication: Mobility status PT Visit Diagnosis: Unsteadiness on feet (R26.81);Repeated falls (R29.6);History of falling (Z91.81)    Time: 3846-6599 PT Time Calculation (min) (ACUTE ONLY): 23 min   Charges:   PT Evaluation $PT Re-evaluation: 1 Re-eval PT Treatments $Therapeutic Exercise: 8-22 mins        Greggory Stallion, PT, DPT [phone]   Chanae Gemma 09/16/2018, 11:50 AM

## 2018-09-16 NOTE — Discharge Instructions (Addendum)
Please pack wound 3 times a week with calcium alginate and cover with gauze dressing.  Minimal weightbearing to left foot (heel only for transfer.)  Otherwise remain nonweightbearing

## 2018-09-16 NOTE — Plan of Care (Signed)
  Problem: Clinical Measurements: Goal: Ability to maintain clinical measurements within normal limits will improve Outcome: Progressing Goal: Will remain free from infection Outcome: Progressing Goal: Diagnostic test results will improve Outcome: Progressing Goal: Respiratory complications will improve Outcome: Progressing Goal: Cardiovascular complication will be avoided Outcome: Progressing   Problem: Pain Managment: Goal: General experience of comfort will improve Outcome: Progressing   Problem: Safety: Goal: Ability to remain free from injury will improve Outcome: Progressing   Problem: Skin Integrity: Goal: Risk for impaired skin integrity will decrease Outcome: Progressing

## 2018-09-16 NOTE — TOC Transition Note (Signed)
Transition of Care [facility]) - CM/SW Discharge Note   Patient Details  Name: [name]. MRN: [id number] Date of Birth: [birth year]  Transition of Care [facility]) CM/SW Contact:  Latanya Maudlin, RN Phone Number: 09/16/2018, 2:03 PM   Clinical Narrative:   Patient to be discharged per MD order. Orders in place for home health services. Patient is active with [facility] and prefers to continue to use them. Spoke to on call RN at [facility] and he is patients actual RN as well. She voiced many concerns over patients safety and him refusing rehab several times in the past. 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 No DME orders. Daughter to provide transport.     Final next level of care: [place] Barriers to Discharge: No Barriers Identified   Patient Goals and CMS Choice Patient states their goals for this hospitalization and ongoing recovery are:: go home with  [place] Medicare.gov Compare Post Acute Care list provided to:: Patient Choice offered to / list presented to : Patient  Discharge Placement                       Discharge Plan and Services   Discharge Planning Services: CM Consult Post Acute Care Choice: Home Health                    HH Arranged: PT [place]: [place] Date [place]: 09/16/18 Time [place]: 1403 Representative spoke with at [place]: Cassie   Social Determinants of Health ([place]) Interventions     Readmission Risk Interventions Readmission Risk Prevention Plan 09/16/2018  Transportation Screening Complete  Medication Review Press photographer) Complete  HRI or [place] Complete  SW Recovery Care/Counseling Consult Patient refused  Palliative Care Screening Patient [place] Patient Refused  Some recent data might be hidden

## 2018-09-18 ENCOUNTER — Telehealth: Payer: Self-pay | Admitting: Internal Medicine

## 2018-09-18 NOTE — Telephone Encounter (Signed)
Please review for refill. Thanks!  

## 2018-09-18 NOTE — Telephone Encounter (Signed)
Pt's wt 84.8 kg, age 85, SCr 1.13, CrCl 58.37.

## 2018-09-18 NOTE — Telephone Encounter (Signed)
Patient's daughter [name] called today in regards to the patient's mediation. Patient was released from the hospital on Saturday.  On his after visit summary it stated for the patient to stop taking the Bactrim. And to start taking Amoxicillin.   Before starting or stopping any medications they would like to speak with you to see what you suggest .  [phone]

## 2018-09-18 NOTE — Telephone Encounter (Signed)
At this point, would recommend doxy.me visit to discuss ER follow up but would recommend to go by discharge summary as to which medications the patient should be taking.

## 2018-09-21 ENCOUNTER — Ambulatory Visit (INDEPENDENT_AMBULATORY_CARE_PROVIDER_SITE_OTHER): Payer: Medicare Other | Admitting: Internal Medicine

## 2018-09-21 ENCOUNTER — Encounter: Payer: Self-pay | Admitting: Internal Medicine

## 2018-09-21 DIAGNOSIS — J441 Chronic obstructive pulmonary disease with (acute) exacerbation: Secondary | ICD-10-CM

## 2018-09-21 DIAGNOSIS — Z9889 Other specified postprocedural states: Secondary | ICD-10-CM

## 2018-09-21 DIAGNOSIS — L03116 Cellulitis of left lower limb: Secondary | ICD-10-CM | POA: Diagnosis not present

## 2018-09-21 DIAGNOSIS — Z95820 Peripheral vascular angioplasty status with implants and grafts: Secondary | ICD-10-CM

## 2018-09-21 DIAGNOSIS — W19XXXD Unspecified fall, subsequent encounter: Secondary | ICD-10-CM | POA: Diagnosis not present

## 2018-09-21 DIAGNOSIS — A419 Sepsis, unspecified organism: Secondary | ICD-10-CM | POA: Diagnosis not present

## 2018-09-21 DIAGNOSIS — L02416 Cutaneous abscess of left lower limb: Secondary | ICD-10-CM

## 2018-09-21 NOTE — Progress Notes (Signed)
Virtual Visit via Video Note  I connected with [name]. on 09/21/18 at  3:00 PM EDT by a video enabled telemedicine application and verified that I am speaking with the correct person using two identifiers.  Location: Patient: Home Provider: Office   I discussed the limitations of evaluation and management by telemedicine and the availability of in person appointments. The patient expressed understanding and agreed to proceed.  History of Present Illness:  Pt due for Hospital Follow Up. He went to the ER 5/31 s/p a fall. He had no injuries but chest xray showed COPD exacerbation.  His urine was also concerning for urosepsis. He also had a cellulitis of the LLE, with non healing wound. He was placed on IV steroids and antibiotics. He underwent angiogram and had a stent placed in his left SFA. He later underwent surgery to close the non healing wound to the left lower extremity. He was discharged on 6/6 with home health, OT/PT. Since discharge, he actually reports that he is feeling better than he has in a while. He weight is down, his breathing has improved. His appetite is back to normal. He has been monitoring his sugars. His pain is minimal in his foot. Home health is coming out doing dressing changes. He will start working with PT/OT tomorrow. He has no concerns at this time.  Past Medical History:  Diagnosis Date  . Arthritis   . CAD    a. MI 01/29/1996 tx'd w/ TPA @ [facility]; b. Myoview 06/2005: EF 50%, scar @ apex, mild peri-infarct ischemia  . Cancer ([place])    skin  . Chronic atrial fibrillation    a. since 2006; b. on warfarin  . Chronic diastolic CHF (congestive heart failure) ([place])    a. echo 04/2006: EF lower limits of nl, mod LVH, mild aortic root dilatation, & mild MR, biatrial enlargement; b. echo 04/2013: EF 60%, mod dilated LA, mild MR & TR, mod pulm HTN w/ RV systolic pressure 53, c. echo 04/21/14: EF 55-60%, unable to exclude WMA, severely dilated LA 6.6 cm, nl RVSP,  mildly dilated aortic root  . COPD (chronic obstructive pulmonary disease) (HCC)    oxygen prn at home  . CVA 3545,6256   x2  . DM   . Falls   . GERD (gastroesophageal reflux disease)   . History of kidney stones   . HYPERLIPIDEMIA   . HYPERTENSION   . Kidney stone    a. s/p left ureteral stenting 04/24/14  . Left arm weakness    limited movement. S/P fall injury  . Neuropathy of both feet   . Poor balance   . Wears dentures    full upper and lower    Current Outpatient Medications  Medication Sig Dispense Refill  . acetaminophen (TYLENOL) 650 MG CR tablet Take 650 mg by mouth every 8 (eight) hours as needed for pain.     Marland Kitchen albuterol (PROAIR HFA) 108 (90 Base) MCG/ACT inhaler USE 1 TO 2 INHALATIONS EVERY 4 HOURS AS NEEDED (Patient taking differently: Inhale 1-2 puffs into the lungs every 4 (four) hours as needed for wheezing or shortness of breath. ) 25.5 g 3  . ALPRAZolam (XANAX) 0.25 MG tablet Take 1 tablet (0.25 mg total) by mouth at bedtime as needed for anxiety. 90 tablet 0  . amoxicillin-clavulanate (AUGMENTIN) 875-125 MG tablet Take 1 tablet by mouth 2 (two) times daily for 7 days. 14 tablet 0  . apixaban (ELIQUIS) 2.5 MG TABS tablet Take 1 tablet (  2.5 mg total) by mouth 2 (two) times daily. 180 tablet 1  . atorvastatin (LIPITOR) 40 MG tablet TAKE 1 TABLET DAILY (Patient taking differently: Take 40 mg by mouth daily at 6 PM. ) 90 tablet 0  . citalopram (CELEXA) 10 MG tablet TAKE 1 TABLET DAILY 90 tablet 0  . clotrimazole (LOTRIMIN) 1 % cream Apply 1 application topically 2 (two) times daily. 30 g 0  . COLCRYS 0.6 MG tablet TAKE 1 TABLET TWICE A DAY AS NEEDED (ACUTE GOUT FLARE) USE AS DIRECTED 90 tablet 7  . COMBIGAN 0.2-0.5 % ophthalmic solution Place 1 drop into both eyes daily.    . diclofenac sodium (VOLTAREN) 1 % GEL Apply 4 g topically 4 (four) times daily. 5 Tube 5  . dorzolamide (TRUSOPT) 2 % ophthalmic solution Place 1 drop into both eyes 3 (three) times daily.     Marland Kitchen  ELIQUIS 5 MG TABS tablet TAKE 1 TABLET TWICE A DAY 180 tablet 1  . ezetimibe (ZETIA) 10 MG tablet TAKE 1 TABLET DAILY 90 tablet 0  . finasteride (PROSCAR) 5 MG tablet TAKE 1 TABLET DAILY (Patient taking differently: Take 5 mg by mouth daily. ) 90 tablet 4  . gabapentin (NEURONTIN) 100 MG capsule TAKE 1 CAPSULE THREE TIMES A DAY (Patient taking differently: Take 100 mg by mouth at bedtime. ) 270 capsule 1  . insulin detemir (LEVEMIR) 100 UNIT/ML injection Inject 0.28 mLs (28 Units total) into the skin daily at 10 pm. (Patient taking differently: Inject 42 Units into the skin daily at 10 pm. ) 10 mL 11  . insulin lispro (HUMALOG) 100 UNIT/ML injection Inject 0.04 mLs (4 Units total) into the skin 3 (three) times daily with meals. (Patient taking differently: Inject 10-18 Units into the skin 3 (three) times daily with meals. 18 am, 3 lunch, 10 supper) 10 mL 11  . Insulin Pen Needle (BD PEN NEEDLE NANO U/F) 32G X 4 MM MISC USE THREE TIMES A DAY FOR INSULIN ADMINISTRATION 270 each 4  . Insulin Syringe-Needle U-100 30G X 1/2" 1 ML MISC 1 each by Does not apply route 3 (three) times daily. 200 each 5  . latanoprost (XALATAN) 0.005 % ophthalmic solution Place 1 drop into both eyes at bedtime.     Marland Kitchen levothyroxine (SYNTHROID, LEVOTHROID) 50 MCG tablet TAKE 1 TABLET DAILY BEFORE BREAKFAST 90 tablet 0  . metolazone (ZAROXOLYN) 5 MG tablet Take 1 tablet (5 mg total) by mouth daily as needed (swelling). 90 tablet 3  . metoprolol succinate (TOPROL-XL) 50 MG 24 hr tablet TAKE 1 TABLET TWICE A DAY 180 tablet 1  . montelukast (SINGULAIR) 10 MG tablet TAKE 1 TABLET AT BEDTIME (Patient taking differently: Take 10 mg by mouth at bedtime. ) 90 tablet 4  . nitroGLYCERIN (NITROSTAT) 0.4 MG SL tablet Place 0.4 mg under the tongue every 5 (five) minutes as needed.      . nystatin-triamcinolone (MYCOLOG II) cream Apply 1 application topically 2 (two) times daily.     . potassium chloride SA (K-DUR,KLOR-CON) 20 MEQ tablet Take 4  tablets (80 meq) by mouth twice daily, take an extra 1 tablet (20 meq) on the days you take metolazone (Patient taking differently: Take 80 mEq by mouth 2 (two) times daily. Take 4 tablets (80 meq) by mouth twice daily at breakfast and at lunch, take an extra 1 tablet (20 meq) on the days you take metolazone put tablet in applesauce to aid swallowing) 732 tablet 3  . predniSONE (DELTASONE) 10 MG  tablet Take 1 tablet (10 mg total) by mouth daily with breakfast. 90 tablet 1  . SYMBICORT 160-4.5 MCG/ACT inhaler USE 2 INHALATIONS TWICE A DAY (Patient taking differently: Inhale 1 puff into the lungs 2 (two) times daily. ) 30.6 g 3  . tamsulosin (FLOMAX) 0.4 MG CAPS capsule TAKE 1 CAPSULE DAILY 90 capsule 0  . torsemide (DEMADEX) 20 MG tablet Take 40 mg by mouth 2 (two) times daily.    . traMADol (ULTRAM) 50 MG tablet Take 1 tablet (50 mg total) by mouth 2 (two) times daily. 180 tablet 0  . traZODone (DESYREL) 50 MG tablet Take 2 tablets (100 mg total) by mouth at bedtime as needed for sleep. 180 tablet 0  . umeclidinium bromide (INCRUSE ELLIPTA) 62.5 MCG/INH AEPB Inhale 1 puff into the lungs daily.     No current facility-administered medications for this visit.     Allergies  Allergen Reactions  . Contrast Media [Iodinated Diagnostic Agents] Shortness Of Breath  . Morphine And Related Other (See Comments)    Hallucinations   . Niacin And Related Dermatitis  . Other     Other reaction(s): SHORTNESS OF BREATH  . Amlodipine Rash    Family History  Problem Relation Age of Onset  . Heart disease Mother   . Heart disease Maternal Grandmother   . Diabetes Maternal Grandmother   . Cancer Neg Hx   . Stroke Neg Hx     Social History   Socioeconomic History  . Marital status: Married    Spouse name: Not on file  . Number of children: Not on file  . Years of education: Not on file  . Highest education level: Not on file  Occupational History  . Not on file  Social Needs  . Financial  resource strain: Not very hard  . Food insecurity    Worry: Never true    Inability: Never true  . Transportation needs    Medical: No    Non-medical: No  Tobacco Use  . Smoking status: Former Smoker    Packs/day: 2.00    Years: 40.00    Pack years: 80.00    Types: Cigarettes    Quit date: 05/24/1990    Years since quitting: 28.3  . Smokeless tobacco: Former Systems developer    Quit date: 05/24/1990  Substance and Sexual Activity  . Alcohol use: No  . Drug use: No  . Sexual activity: Not Currently  Lifestyle  . Physical activity    Days per week: Patient refused    Minutes per session: Patient refused  . Stress: Not at all  Relationships  . Social Herbalist on phone: Patient refused    Gets together: Patient refused    Attends religious service: Patient refused    Active member of club or organization: Patient refused    Attends meetings of clubs or organizations: Patient refused    Relationship status: Patient refused  . Intimate partner violence    Fear of current or ex partner: Patient refused    Emotionally abused: Patient refused    Physically abused: Patient refused    Forced sexual activity: Patient refused  Other Topics Concern  . Not on file  Social History Narrative  . Not on file     Constitutional: Denies fever, malaise, fatigue, headache or abrupt weight changes.  HEENT: Denies eye pain, eye redness, ear pain, ringing in the ears, wax buildup, runny nose, nasal congestion, bloody nose, or sore throat. Respiratory: Denies difficulty  breathing, shortness of breath, cough or sputum production.   Cardiovascular: Denies chest pain, chest tightness, palpitations or swelling in the hands or feet.  Musculoskeletal: Pt reports left foot pain and swelling. Denies decrease in range of motion, difficulty with gait, muscle pain.  Skin: Pt reports redness to left leg. Denies lesions or ulcercations.  Neurological: Pt reports difficulty with balance. Denies dizziness,  difficulty with memory, difficulty with speech or problems with coordination.    No other specific complaints in a complete review of systems (except as listed in HPI above).  Wt Readings from Last 3 Encounters:  09/13/18 187 lb (84.8 kg)  08/29/18 187 lb (84.8 kg)  08/27/18 190 lb 4.1 oz (86.3 kg)    Neurological: Alert and oriented.   BMET    Component Value Date/Time   NA 142 09/16/2018 0530   NA 143 02/21/2015 1108   NA 137 08/05/2014 1839   K 3.2 (L) 09/16/2018 0530   K 3.6 08/05/2014 1839   CL 104 09/16/2018 0530   CL 106 08/05/2014 1839   CO2 29 09/16/2018 0530   CO2 23 08/05/2014 1839   GLUCOSE 183 (H) 09/16/2018 0530   GLUCOSE 235 (H) 08/05/2014 1839   BUN 42 (H) 09/16/2018 0530   BUN 26 02/21/2015 1108   BUN 19 08/05/2014 1839   CREATININE 1.13 09/16/2018 0530   CREATININE 1.20 08/05/2014 1839   CALCIUM 8.7 (L) 09/16/2018 0530   CALCIUM 8.6 (L) 08/05/2014 1839   GFRNONAA 59 (L) 09/16/2018 0530   GFRNONAA 57 (L) 08/05/2014 1839   GFRAA >60 09/16/2018 0530   GFRAA >60 08/05/2014 1839    Lipid Panel     Component Value Date/Time   CHOL 122 02/16/2018 1126   TRIG 189.0 (H) 02/16/2018 1126   HDL 47.50 02/16/2018 1126   CHOLHDL 3 02/16/2018 1126   VLDL 37.8 02/16/2018 1126   LDLCALC 37 02/16/2018 1126    CBC    Component Value Date/Time   WBC 14.0 (H) 09/16/2018 0530   RBC 4.23 09/16/2018 0530   HGB 12.2 (L) 09/16/2018 0530   HGB 13.3 08/05/2014 1839   HCT 38.3 (L) 09/16/2018 0530   HCT 40.1 08/05/2014 1839   PLT 200 09/16/2018 0530   PLT 246 08/05/2014 1839   MCV 90.5 09/16/2018 0530   MCV 83 08/05/2014 1839   MCH 28.8 09/16/2018 0530   MCHC 31.9 09/16/2018 0530   RDW 16.5 (H) 09/16/2018 0530   RDW 16.3 (H) 08/05/2014 1839   LYMPHSABS 2.2 09/12/2018 0312   LYMPHSABS 2.3 08/05/2014 1839   MONOABS 1.7 (H) 09/12/2018 0312   MONOABS 2.0 (H) 08/05/2014 1839   EOSABS 0.1 09/12/2018 0312   EOSABS 0.1 08/05/2014 1839   BASOSABS 0.0 09/12/2018  0312   BASOSABS 0.1 08/05/2014 1839    Hgb A1C Lab Results  Component Value Date   HGBA1C 7.4 (H) 09/11/2018         Assessment and Plan:  Hospital Follow Up for Fall, COPD Exacerbation, Urosepsis, Cellulitis of the LLE, Repair of Open Wound, s/p Angiogram:  ER notes, labs and imaging reviewed He will continue meds per discharge summary He will continue to work with home health, PT/OT He will follow up with podiatry as an outpatient He will follow up with vascular as an outpatient  Return precautions discussed  Follow Up Instructions:    I discussed the assessment and treatment plan with the patient. The patient was provided an opportunity to ask questions and all were answered. The  patient agreed with the plan and demonstrated an understanding of the instructions.   The patient was advised to call back or seek an in-person evaluation if the symptoms worsen or if the condition fails to improve as anticipated.  I provided 6 minutes of non-face-to-face time during this encounter.   Webb Silversmith, NP

## 2018-09-21 NOTE — Patient Instructions (Signed)
Urosepsis, Adult  Urosepsis is an infection that has spread to the blood (sepsis) from the kidneys, ureters, bladder, and urethra (urinary tract). These organs make, store, and pass urine from the body. Urosepsis is a severe illness that can be life-threatening if it is not treated immediately. What are the causes? Causes of this condition include:  A urinary tract infection (UTI) that spreads to your blood.  A urinary tract blockage (obstruction) due to kidney stones.  Having a small, thin tube in your urethra that drains urine from your bladder for a period of time (indwelling urinary catheter).  Swelling of the prostate (prostatitis) or prostate infection (abscess), in men. What increases the risk? This condition is more likely to develop in people who:  Are male.  Are 65 or older.  Have a long-term (chronic) disease, such as diabetes.  Have a weak disease-fighting system (immune system).  Have a condition that lessens or changes urine flow, such as a kidney or bladder stone, prostate disease, or a tumor of the urinary tract.  Have had surgery of the urinary tract.  Use a urinary catheter.  Have lost feeling below the waist or are in a wheelchair. What are the signs or symptoms? Early symptoms of this condition are similar to symptoms of a severe UTI. These may include:  Pain in your side, back, or lower abdomen.  Fever and chills.  Nausea and vomiting.  Frequent need to pass urine.  Burning pain when passing urine.  Bloody or cloudy urine.  Bad-smelling urine.  Fatigue. Once the infection has spread to the blood and a sepsis reaction starts, other symptoms may include:  High fever.  Chills with shaking.  Fast breathing.  Fast heartbeat.  Cold and clammy skin.  Anxiety.  Confusion.  Severe pain in the abdomen.  Trouble breathing.  Trouble passing urine or not being able to pass urine.  Fainting. How is this diagnosed? This condition is  diagnosed based on your symptoms, your medical history, and a physical exam. You may have urine tests or blood tests to check kidney function and look for infection. You may also have imaging tests to check for blockages in the urinary tract. How is this treated? This condition is a medical emergency that needs to be treated in the hospital. Treatment may include:  Receiving one or more of the following through an IV: ? Antibiotic medicines. ? Fluids. ? Medicines to support blood pressure.  Oxygen and breathing support.  Removing a urinary catheter that may be a source of infection, if you have one.  Filtering your blood with a machine (dialysis).  Surgery to drain infected areas or restore urine flow. This is rare. Follow these instructions at home:  Take over-the-counter and prescription medicines only as told by your health care provider.  If you were prescribed an antibiotic medicine to take at home, take it as told by your health care provider. Do not stop taking the antibiotic even if you start to feel better.  Drink enough fluid to keep your urine pale yellow.  Return to your normal activities as told by your health care provider. Ask your health care provider what activities are safe for you.  Keep all follow-up visits as told by your health care provider. This is important. Contact a health care provider if you have:  Symptoms that get worse or do not get better with treatment.  New UTI symptoms. Get help right away if:  You have new or continued symptoms of sepsis  after hospitalization, such as: ? High fever. ? Chills. ? Difficulty breathing. ? Confusion. ? Nausea and vomiting. These symptoms may represent a serious problem that is an emergency. Do not wait to see if the symptoms will go away. Get medical help right away. Call your local emergency services (911 in the [place] Do not drive yourself to the hospital. Summary  Urosepsis is an infection that has spread  to the blood (sepsis) from the urinary tract. It is a severe illness that can be life-threatening if it is not treated immediately.  Possible causes of urosepsis include a UTI that spreads to your blood, blockage from kidney stones, having a urinary catheter, or prostate swelling or infection.  This condition is a medical emergency that is treated in the hospital with antibiotics. This information is not intended to replace advice given to you by your health care provider. Make sure you discuss any questions you have with your health care provider. Document Released: 03/29/2005 Document Revised: 02/17/2017 Document Reviewed: 02/17/2017 Elsevier Interactive Patient Education  2019 [employer].

## 2018-09-22 NOTE — Discharge Summary (Signed)
[facility] NAME: [name]    MR#:  [id number]  DATE OF BIRTH:  1931-10-05  DATE OF ADMISSION:  09/10/2018 ADMITTING PHYSICIAN: Otila Back, MD  DATE OF DISCHARGE: 09/16/2018  2:43 PM  PRIMARY CARE PHYSICIAN: Jearld Fenton, NP   ADMISSION DIAGNOSIS:  Urinary tract infection without hematuria, site unspecified [N39.0] Sepsis without acute organ dysfunction, due to unspecified organism ([place]) [A41.9]  DISCHARGE DIAGNOSIS:  Active Problems:   Sepsis ([place])   Pressure injury of skin   SECONDARY DIAGNOSIS:   Past Medical History:  Diagnosis Date  . Arthritis   . CAD    a. MI 01/29/1996 tx'd w/ TPA @ [facility]; b. Myoview 06/2005: EF 50%, scar @ apex, mild peri-infarct ischemia  . Cancer ([place])    skin  . Chronic atrial fibrillation    a. since 2006; b. on warfarin  . Chronic diastolic CHF (congestive heart failure) ([place])    a. echo 04/2006: EF lower limits of nl, mod LVH, mild aortic root dilatation, & mild MR, biatrial enlargement; b. echo 04/2013: EF 60%, mod dilated LA, mild MR & TR, mod pulm HTN w/ RV systolic pressure 53, c. echo 04/21/14: EF 55-60%, unable to exclude WMA, severely dilated LA 6.6 cm, nl RVSP, mildly dilated aortic root  . COPD (chronic obstructive pulmonary disease) (H[facility])    oxygen prn at home  . CVA [id number]   x2  . DM   . Falls   . GERD (gastroesophageal reflux disease)   . History of kidney stones   . HYPERLIPIDEMIA   . HYPERTENSION   . Kidney stone    a. s/p left ureteral stenting 04/24/14  . Left arm weakness    limited movement. S/P fall injury  . Neuropathy of both feet   . Poor balance   . Wears dentures    full upper and lower     ADMITTING HISTORY  HISTORY OF PRESENT ILLNESS:  [name]  is a 85 y.o. male with a known history of chronic atrial fibrillation, chronic diastolic CHF hypertension, diabetes mellitus, COPD, hyperlipidemia and hypothyroidism who presented to the emergency room  after mechanical fall at home.  Patient tripped and fell due to the hard soled shoes he was wearing on his left foot following previous amputation for the left great toe by podiatrist.  Patient did not hit his head.  Did not lose consciousness.  Sustained some abrasions over the bridge of his nose and left upper extremity.  Imaging studies of the head with no acute intracranial findings.  No evidence of traumatic injury to cervical spine.  Patient was evaluated in the emergency room and diagnosed with sepsis secondary to urinary tract infection.  Findings of cellulitis of left lower extremity was admitted.  Was given a dose of IV vancomycin and Rocephin in the emergency room.  Medical service called to admit patient for further evaluation and management.   HOSPITAL COURSE:   Patient is an 59 left male with history of chronic atrial fibrillation,chronic diastolic CHF hypertension,diabetes mellitus,COPD,hyperlipidemia and hypothyroidism who presented to the emergency room after mechanical fall at home. Patient was diagnosed with sepsis secondary to urinary tract infection.  *COPD exacerbation.  .  IV steroids and nebulizers.  Improved well.  By day of discharge he has minimal wheezing and shortness of breath is back to baseline.  *Sepsis secondary to urinary tract infection. Treated with IV cefepime in the hospital and finished course for urinary tract  infection  *Cellulitis of left lower extremity. Podiatry consulted.  Diagnosed with open wound with necrotic bone left footh.    Patient had debridement with podiatry.  Continued on IV antibiotics. Patient was also seen by vascular surgery and status post balloon angioplasty and left SFA stent placement. On day of discharge his dressing was changed by podiatry Dr. Vickki Muff.  Discussed with him regarding further care.  Advised oral antibiotics and follow-up in the clinic.  Dressing instructions given.  Discharged home on 1 week of  antibiotics.  *Chronic atrial fibrillation Rate controlled. Patient on anticoagulation with Eliquis. Anticoagulation was held for procedure.  Restarted at discharge.   *Hypothyroidism Continue Synthroid. TSH level normal  *Diabetes mellitus type 2 Initially uncontrolled but much improved by day of discharge.  Discharged home on home medications.  *Acute kidney injury over CKD stage III. Resolved  Hypokalemia Replace  Patient stable for discharge home with home health services and follow-up with primary care physician and podiatry.  CONSULTS OBTAINED:    DRUG ALLERGIES:   Allergies  Allergen Reactions  . Contrast Media [Iodinated Diagnostic Agents] Shortness Of Breath  . Morphine And Related Other (See Comments)    Hallucinations   . Niacin And Related Dermatitis  . Other     Other reaction(s): SHORTNESS OF BREATH  . Amlodipine Rash    DISCHARGE MEDICATIONS:   Allergies as of 09/16/2018      Reactions   Contrast Media [iodinated Diagnostic Agents] Shortness Of Breath   Morphine And Related Other (See Comments)   Hallucinations   Niacin And Related Dermatitis   Other    Other reaction(s): SHORTNESS OF BREATH   Amlodipine Rash      Medication List    STOP taking these medications   sulfamethoxazole-trimethoprim 800-160 MG tablet Commonly known as: Bactrim DS     TAKE these medications   acetaminophen 650 MG CR tablet Commonly known as: TYLENOL Take 650 mg by mouth every 8 (eight) hours as needed for pain.   albuterol 108 (90 Base) MCG/ACT inhaler Commonly known as: ProAir HFA USE 1 TO 2 INHALATIONS EVERY 4 HOURS AS NEEDED What changed:   how much to take  how to take this  when to take this  reasons to take this  additional instructions   ALPRAZolam 0.25 MG tablet Commonly known as: XANAX Take 1 tablet (0.25 mg total) by mouth at bedtime as needed for anxiety.   amoxicillin-clavulanate 875-125 MG tablet Commonly known as:  Augmentin Take 1 tablet by mouth 2 (two) times daily for 7 days. Notes to patient: This evening 09/16/2018   apixaban 2.5 MG Tabs tablet Commonly known as: ELIQUIS Take 1 tablet (2.5 mg total) by mouth 2 (two) times daily. Notes to patient: This evening 09/16/2018   atorvastatin 40 MG tablet Commonly known as: LIPITOR TAKE 1 TABLET DAILY What changed: when to take this Notes to patient: Tomorrow night 09/17/2018   citalopram 10 MG tablet Commonly known as: CELEXA TAKE 1 TABLET DAILY   clotrimazole 1 % cream Commonly known as: LOTRIMIN Apply 1 application topically 2 (two) times daily.   Colcrys 0.6 MG tablet Generic drug: colchicine TAKE 1 TABLET TWICE A DAY AS NEEDED (ACUTE GOUT FLARE) USE AS DIRECTED   Combigan 0.2-0.5 % ophthalmic solution Generic drug: brimonidine-timolol Place 1 drop into both eyes daily. Notes to patient: Tonight 09/16/2018   diclofenac sodium 1 % Gel Commonly known as: VOLTAREN Apply 4 g topically 4 (four) times daily.   dorzolamide 2 %  ophthalmic solution Commonly known as: TRUSOPT Place 1 drop into both eyes 3 (three) times daily. Notes to patient: This afternoon 09/16/2018   ezetimibe 10 MG tablet Commonly known as: ZETIA TAKE 1 TABLET DAILY Notes to patient: Tomorrow morning 09/17/2018   finasteride 5 MG tablet Commonly known as: PROSCAR TAKE 1 TABLET DAILY Notes to patient: Tomorrow morning 09/17/2018   gabapentin 100 MG capsule Commonly known as: NEURONTIN TAKE 1 CAPSULE THREE TIMES A DAY What changed: when to take this Notes to patient: This afternoon 09/16/2018   Incruse Ellipta 62.5 MCG/INH Aepb Generic drug: umeclidinium bromide Inhale 1 puff into the lungs daily. Notes to patient: Tomorrow morning 09/17/2018   insulin detemir 100 UNIT/ML injection Commonly known as: LEVEMIR Inject 0.28 mLs (28 Units total) into the skin daily at 10 pm. What changed: how much to take Notes to patient: Tomorrow morning 09/17/2018    insulin lispro 100 UNIT/ML injection Commonly known as: HumaLOG Inject 0.04 mLs (4 Units total) into the skin 3 (three) times daily with meals. What changed:   how much to take  additional instructions Notes to patient: Tonight before dinner 09/16/2018   Insulin Pen Needle 32G X 4 MM Misc Commonly known as: BD Pen Needle Nano U/F USE THREE TIMES A DAY FOR INSULIN ADMINISTRATION   Insulin Syringe-Needle U-100 30G X 1/2" 1 ML Misc 1 each by Does not apply route 3 (three) times daily.   latanoprost 0.005 % ophthalmic solution Commonly known as: XALATAN Place 1 drop into both eyes at bedtime. Notes to patient: Tonight at bedtime 09/16/2018   levothyroxine 50 MCG tablet Commonly known as: SYNTHROID TAKE 1 TABLET DAILY BEFORE BREAKFAST Notes to patient: Tomorrow morning before breakfast 09/17/2018   metolazone 5 MG tablet Commonly known as: ZAROXOLYN Take 1 tablet (5 mg total) by mouth daily as needed (swelling).   metoprolol succinate 50 MG 24 hr tablet Commonly known as: TOPROL-XL TAKE 1 TABLET TWICE A DAY Notes to patient: Tomorrow morning 09/17/2018   montelukast 10 MG tablet Commonly known as: SINGULAIR TAKE 1 TABLET AT BEDTIME Notes to patient: Tonight at bedtime 09/16/2018   nitroGLYCERIN 0.4 MG SL tablet Commonly known as: NITROSTAT Place 0.4 mg under the tongue every 5 (five) minutes as needed.   nystatin-triamcinolone cream Commonly known as: MYCOLOG II Apply 1 application topically 2 (two) times daily.   potassium chloride SA 20 MEQ tablet Commonly known as: K-DUR Take 4 tablets (80 meq) by mouth twice daily, take an extra 1 tablet (20 meq) on the days you take metolazone What changed:   how much to take  how to take this  when to take this  additional instructions Notes to patient: Tomorrow morning 09/17/2018   predniSONE 10 MG tablet Commonly known as: DELTASONE Take 1 tablet (10 mg total) by mouth daily with breakfast. Notes to patient:  Tomorrow morning 09/17/2018   Symbicort 160-4.5 MCG/ACT inhaler Generic drug: budesonide-formoterol USE 2 INHALATIONS TWICE A DAY What changed: See the new instructions. Notes to patient: This evening 09/16/2018   tamsulosin 0.4 MG Caps capsule Commonly known as: FLOMAX TAKE 1 CAPSULE DAILY Notes to patient: Tomorrow morning 09/17/2018   torsemide 20 MG tablet Commonly known as: DEMADEX Take 40 mg by mouth 2 (two) times daily. Notes to patient: This evening 09/16/2018   traMADol 50 MG tablet Commonly known as: ULTRAM Take 1 tablet (50 mg total) by mouth 2 (two) times daily. Notes to patient: Tonight at bedtime 09/16/2018   traZODone 50 MG tablet  Commonly known as: DESYREL Take 2 tablets (100 mg total) by mouth at bedtime as needed for sleep.       Today   VITAL SIGNS:  Blood pressure 140/64, pulse 61, temperature 97.8 F (36.6 C), temperature source Oral, resp. rate 20, height 5\' 6"  (1.676 m), weight 84.8 kg, SpO2 93 %.  I/O:  No intake or output data in the 24 hours ending 09/22/18 1319  PHYSICAL EXAMINATION:  Physical Exam  GENERAL:  85 y.o.-year-old patient lying in the bed with no acute distress.  LUNGS: Normal breath sounds bilaterally, no wheezing, rales,rhonchi or crepitation. No use of accessory muscles of respiration.  CARDIOVASCULAR: S1, S2 normal. No murmurs, rubs, or gallops.  ABDOMEN: Soft, non-tender, non-distended. Bowel sounds present. No organomegaly or mass.  NEUROLOGIC: Moves all 4 extremities. PSYCHIATRIC: The patient is alert and oriented x 3.  SKIN: No obvious rash, lesion, or ulcer.   DATA REVIEW:   CBC Recent Labs  Lab 09/16/18 0530  WBC 14.0*  HGB 12.2*  HCT 38.3*  PLT 200    Chemistries  Recent Labs  Lab 09/16/18 0530  NA 142  K 3.2*  CL 104  CO2 29  GLUCOSE 183*  BUN 42*  CREATININE 1.13  CALCIUM 8.7*  MG 2.5*    Cardiac Enzymes No results for input(s): TROPONINI in the last 168 hours.  Microbiology Results   Results for orders placed or performed during the hospital encounter of 09/10/18  SARS Coronavirus 2 (CEPHEID - Performed in [facility] hospital lab), Hosp Order     Status: None   Collection Time: 09/10/18 12:13 PM   Specimen: Nasopharyngeal Swab  Result Value Ref Range Status   SARS Coronavirus 2 NEGATIVE NEGATIVE Final    Comment: (NOTE) If result is NEGATIVE SARS-CoV-2 target nucleic acids are NOT DETECTED. The SARS-CoV-2 RNA is generally detectable in upper and lower  respiratory specimens during the acute phase of infection. The lowest  concentration of SARS-CoV-2 viral copies this assay can detect is 250  copies / mL. A negative result does not preclude SARS-CoV-2 infection  and should not be used as the sole basis for treatment or other  patient management decisions.  A negative result may occur with  improper specimen collection / handling, submission of specimen other  than nasopharyngeal swab, presence of viral mutation(s) within the  areas targeted by this assay, and inadequate number of viral copies  (<250 copies / mL). A negative result must be combined with clinical  observations, patient history, and epidemiological information. If result is POSITIVE SARS-CoV-2 target nucleic acids are DETECTED. The SARS-CoV-2 RNA is generally detectable in upper and lower  respiratory specimens dur ing the acute phase of infection.  Positive  results are indicative of active infection with SARS-CoV-2.  Clinical  correlation with patient history and other diagnostic information is  necessary to determine patient infection status.  Positive results do  not rule out bacterial infection or co-infection with other viruses. If result is PRESUMPTIVE POSTIVE SARS-CoV-2 nucleic acids MAY BE PRESENT.   A presumptive positive result was obtained on the submitted specimen  and confirmed on repeat testing.  While 2019 novel coronavirus  (SARS-CoV-2) nucleic acids may be present in the  submitted sample  additional confirmatory testing may be necessary for epidemiological  and / or clinical management purposes  to differentiate between  SARS-CoV-2 and other Sarbecovirus currently known to infect humans.  If clinically indicated additional testing with an alternate test  methodology [phone]) is advised.  The SARS-CoV-2 RNA is generally  detectable in upper and lower respiratory sp ecimens during the acute  phase of infection. The expected result is Negative. Fact Sheet for Patients:  StrictlyIdeas.no Fact Sheet for Healthcare Providers: BankingDealers.co.za This test is not yet approved or cleared by the [place] FDA and has been authorized for detection and/or diagnosis of SARS-CoV-2 by FDA under an Emergency Use Authorization (EUA).  This EUA will remain in effect (meaning this test can be used) for the duration of the COVID-19 declaration under Section 564(b)(1) of the Act, 21 U.S.C. section 360bbb-3(b)(1), unless the authorization is terminated or revoked sooner. Performed at [facility], [address] [place], [place] [address]   MRSA PCR Screening     Status: None   Collection Time: 09/15/18 12:40 AM   Specimen: Nasal Mucosa; Nasopharyngeal  Result Value Ref Range Status   MRSA by PCR NEGATIVE NEGATIVE Final    Comment:        The GeneXpert MRSA Assay (FDA approved for NASAL specimens only), is one component of a comprehensive MRSA colonization surveillance program. It is not intended to diagnose MRSA infection nor to guide or monitor treatment for MRSA infections. Performed at [facility], [address] [place], [place] [address]     RADIOLOGY:  No results found.  Follow up with PCP in 1 week.  Management plans discussed with the patient, family and they are in agreement.  CODE STATUS:  Code Status History    Date Active Date Inactive Code Status Order ID Comments  User Context   09/10/2018 1524 09/16/2018 1749 Full Code [id number]  Otila Back, MD ED   08/25/2018 0247 08/27/2018 1640 Full Code [id number]  Lance Coon, MD Inpatient   06/09/2018 1855 06/16/2018 1756 Full Code [id number]  Demetrios Loll, MD Inpatient   04/17/2018 1341 04/18/2018 1749 Full Code [id number]  Hillary Bow, MD ED   12/22/2017 0613 12/26/2017 2049 Full Code [id number]  [name], MD Inpatient   10/10/2016 1520 10/11/2016 1829 Full Code [id number]  Demetrios Loll, MD Inpatient   10/10/2014 0258 10/11/2014 1704 Full Code [id number]  Harrie Foreman, MD Inpatient   Advance Care Planning Activity      TOTAL TIME TAKING CARE OF THIS PATIENT ON DAY OF DISCHARGE: more than 30 minutes.   [name] M.D on 09/22/2018 at 1:19 PM  Between 7am to 6pm - Pager - [phone]  After 6pm go to [email or web address] - password [email or web address] [facility]  Office  [phone]  CC: Primary care physician; Jearld Fenton, NP  Note: This dictation was prepared with Dragon dictation along with smaller phrase technology. Any transcriptional errors that result from this process are unintentional.

## 2018-09-24 ENCOUNTER — Other Ambulatory Visit: Payer: Self-pay | Admitting: Internal Medicine

## 2018-09-25 ENCOUNTER — Telehealth: Payer: Self-pay | Admitting: *Deleted

## 2018-09-25 ENCOUNTER — Telehealth: Payer: Self-pay | Admitting: Internal Medicine

## 2018-09-25 NOTE — Telephone Encounter (Signed)
noted 

## 2018-09-25 NOTE — Telephone Encounter (Signed)
He probably needs to go back to the hospital but will wait to hear what Dr. Caryl Comes has to say.

## 2018-09-25 NOTE — Telephone Encounter (Signed)
Best number [phone] Estill Bamberg @ [facility] called she has concerns regarding pt  His color is off   He is not feeling well vital are stable breathing more labored more sob no wheezing or cough.  He has a wound she is going to call dr Caryl Comes about  Wound has opened up

## 2018-09-25 NOTE — Telephone Encounter (Signed)
Copied from [place] [id number]. Topic: General - Other >> Sep 22, 2018  4:31 PM Pauline Good wrote: Reason for CRM: [facility] is calling and stating the PT is being delayed due to pt's foot was bleeding. Just an [place]

## 2018-09-27 NOTE — Telephone Encounter (Signed)
Called seemed like someone answered but did not have good service... phone disconnected, will try again later

## 2018-09-28 ENCOUNTER — Other Ambulatory Visit: Payer: Self-pay | Admitting: Internal Medicine

## 2018-10-02 NOTE — Telephone Encounter (Signed)
Estill Bamberg with [employer] called to advise pt's wound has opened and necrotic tissue around the wound. Estill Bamberg advised the grandaughter to take him to the hospital on Sunday, she said pt refused. Estill Bamberg will go out on Wed, pt has appt --with Dr Caryl Comes on Tues 6/23 She was calling to update on condition. [phone] Bamberg said pt does not understand this is a circulatory issue and she can't heal this wound by just putting betadine on the wound.

## 2018-10-02 NOTE — Telephone Encounter (Signed)
Noted, if refuse, follow up with podiatry as scheduled.

## 2018-10-03 ENCOUNTER — Other Ambulatory Visit: Payer: Self-pay

## 2018-10-03 ENCOUNTER — Inpatient Hospital Stay
Admission: AD | Admit: 2018-10-03 | Discharge: 2018-10-13 | DRG: 239 | Disposition: A | Discharge status: Skilled Nursing Facility | Payer: Medicare Other | Attending: Internal Medicine | Admitting: Internal Medicine

## 2018-10-03 DIAGNOSIS — F329 Major depressive disorder, single episode, unspecified: Secondary | ICD-10-CM | POA: Diagnosis present

## 2018-10-03 DIAGNOSIS — L97429 Non-pressure chronic ulcer of left heel and midfoot with unspecified severity: Secondary | ICD-10-CM | POA: Diagnosis present

## 2018-10-03 DIAGNOSIS — R0602 Shortness of breath: Secondary | ICD-10-CM

## 2018-10-03 DIAGNOSIS — I081 Rheumatic disorders of both mitral and tricuspid valves: Secondary | ICD-10-CM | POA: Diagnosis present

## 2018-10-03 DIAGNOSIS — I272 Pulmonary hypertension, unspecified: Secondary | ICD-10-CM | POA: Diagnosis present

## 2018-10-03 DIAGNOSIS — H409 Unspecified glaucoma: Secondary | ICD-10-CM | POA: Diagnosis present

## 2018-10-03 DIAGNOSIS — R109 Unspecified abdominal pain: Secondary | ICD-10-CM

## 2018-10-03 DIAGNOSIS — Z7952 Long term (current) use of systemic steroids: Secondary | ICD-10-CM

## 2018-10-03 DIAGNOSIS — I77810 Thoracic aortic ectasia: Secondary | ICD-10-CM | POA: Diagnosis present

## 2018-10-03 DIAGNOSIS — Z9981 Dependence on supplemental oxygen: Secondary | ICD-10-CM

## 2018-10-03 DIAGNOSIS — I110 Hypertensive heart disease with heart failure: Secondary | ICD-10-CM | POA: Diagnosis present

## 2018-10-03 DIAGNOSIS — Z955 Presence of coronary angioplasty implant and graft: Secondary | ICD-10-CM

## 2018-10-03 DIAGNOSIS — I252 Old myocardial infarction: Secondary | ICD-10-CM

## 2018-10-03 DIAGNOSIS — Z833 Family history of diabetes mellitus: Secondary | ICD-10-CM

## 2018-10-03 DIAGNOSIS — Z791 Long term (current) use of non-steroidal anti-inflammatories (NSAID): Secondary | ICD-10-CM

## 2018-10-03 DIAGNOSIS — M1990 Unspecified osteoarthritis, unspecified site: Secondary | ICD-10-CM | POA: Diagnosis present

## 2018-10-03 DIAGNOSIS — L97529 Non-pressure chronic ulcer of other part of left foot with unspecified severity: Secondary | ICD-10-CM | POA: Diagnosis not present

## 2018-10-03 DIAGNOSIS — E1142 Type 2 diabetes mellitus with diabetic polyneuropathy: Secondary | ICD-10-CM | POA: Diagnosis present

## 2018-10-03 DIAGNOSIS — Z7901 Long term (current) use of anticoagulants: Secondary | ICD-10-CM

## 2018-10-03 DIAGNOSIS — E785 Hyperlipidemia, unspecified: Secondary | ICD-10-CM | POA: Diagnosis present

## 2018-10-03 DIAGNOSIS — J189 Pneumonia, unspecified organism: Secondary | ICD-10-CM | POA: Diagnosis not present

## 2018-10-03 DIAGNOSIS — Z89412 Acquired absence of left great toe: Secondary | ICD-10-CM | POA: Diagnosis not present

## 2018-10-03 DIAGNOSIS — N400 Enlarged prostate without lower urinary tract symptoms: Secondary | ICD-10-CM | POA: Diagnosis present

## 2018-10-03 DIAGNOSIS — R140 Abdominal distension (gaseous): Secondary | ICD-10-CM

## 2018-10-03 DIAGNOSIS — R0902 Hypoxemia: Secondary | ICD-10-CM

## 2018-10-03 DIAGNOSIS — Z794 Long term (current) use of insulin: Secondary | ICD-10-CM

## 2018-10-03 DIAGNOSIS — Z95820 Peripheral vascular angioplasty status with implants and grafts: Secondary | ICD-10-CM

## 2018-10-03 DIAGNOSIS — Z79890 Hormone replacement therapy (postmenopausal): Secondary | ICD-10-CM

## 2018-10-03 DIAGNOSIS — T8781 Dehiscence of amputation stump: Secondary | ICD-10-CM | POA: Diagnosis present

## 2018-10-03 DIAGNOSIS — K5900 Constipation, unspecified: Secondary | ICD-10-CM | POA: Diagnosis not present

## 2018-10-03 DIAGNOSIS — Z20828 Contact with and (suspected) exposure to other viral communicable diseases: Secondary | ICD-10-CM | POA: Diagnosis present

## 2018-10-03 DIAGNOSIS — Z79899 Other long term (current) drug therapy: Secondary | ICD-10-CM | POA: Diagnosis not present

## 2018-10-03 DIAGNOSIS — Z5320 Procedure and treatment not carried out because of patient's decision for unspecified reasons: Secondary | ICD-10-CM | POA: Diagnosis not present

## 2018-10-03 DIAGNOSIS — I2510 Atherosclerotic heart disease of native coronary artery without angina pectoris: Secondary | ICD-10-CM | POA: Diagnosis present

## 2018-10-03 DIAGNOSIS — Z8673 Personal history of transient ischemic attack (TIA), and cerebral infarction without residual deficits: Secondary | ICD-10-CM

## 2018-10-03 DIAGNOSIS — K219 Gastro-esophageal reflux disease without esophagitis: Secondary | ICD-10-CM | POA: Diagnosis present

## 2018-10-03 DIAGNOSIS — I482 Chronic atrial fibrillation, unspecified: Secondary | ICD-10-CM | POA: Diagnosis present

## 2018-10-03 DIAGNOSIS — I5032 Chronic diastolic (congestive) heart failure: Secondary | ICD-10-CM | POA: Diagnosis present

## 2018-10-03 DIAGNOSIS — Z91041 Radiographic dye allergy status: Secondary | ICD-10-CM

## 2018-10-03 DIAGNOSIS — Z7951 Long term (current) use of inhaled steroids: Secondary | ICD-10-CM

## 2018-10-03 DIAGNOSIS — L089 Local infection of the skin and subcutaneous tissue, unspecified: Secondary | ICD-10-CM | POA: Diagnosis present

## 2018-10-03 DIAGNOSIS — Y835 Amputation of limb(s) as the cause of abnormal reaction of the patient, or of later complication, without mention of misadventure at the time of the procedure: Secondary | ICD-10-CM | POA: Diagnosis present

## 2018-10-03 DIAGNOSIS — J440 Chronic obstructive pulmonary disease with acute lower respiratory infection: Secondary | ICD-10-CM | POA: Diagnosis not present

## 2018-10-03 DIAGNOSIS — L0889 Other specified local infections of the skin and subcutaneous tissue: Secondary | ICD-10-CM | POA: Diagnosis present

## 2018-10-03 DIAGNOSIS — E039 Hypothyroidism, unspecified: Secondary | ICD-10-CM | POA: Diagnosis present

## 2018-10-03 DIAGNOSIS — E1152 Type 2 diabetes mellitus with diabetic peripheral angiopathy with gangrene: Secondary | ICD-10-CM | POA: Diagnosis present

## 2018-10-03 DIAGNOSIS — Z79891 Long term (current) use of opiate analgesic: Secondary | ICD-10-CM

## 2018-10-03 DIAGNOSIS — E11621 Type 2 diabetes mellitus with foot ulcer: Secondary | ICD-10-CM | POA: Diagnosis present

## 2018-10-03 DIAGNOSIS — Z885 Allergy status to narcotic agent status: Secondary | ICD-10-CM

## 2018-10-03 DIAGNOSIS — E1151 Type 2 diabetes mellitus with diabetic peripheral angiopathy without gangrene: Secondary | ICD-10-CM | POA: Diagnosis not present

## 2018-10-03 DIAGNOSIS — I739 Peripheral vascular disease, unspecified: Secondary | ICD-10-CM | POA: Diagnosis not present

## 2018-10-03 DIAGNOSIS — E11628 Type 2 diabetes mellitus with other skin complications: Secondary | ICD-10-CM | POA: Diagnosis present

## 2018-10-03 DIAGNOSIS — Z87891 Personal history of nicotine dependence: Secondary | ICD-10-CM | POA: Diagnosis not present

## 2018-10-03 DIAGNOSIS — Z888 Allergy status to other drugs, medicaments and biological substances status: Secondary | ICD-10-CM

## 2018-10-03 DIAGNOSIS — Z8249 Family history of ischemic heart disease and other diseases of the circulatory system: Secondary | ICD-10-CM

## 2018-10-03 DIAGNOSIS — F419 Anxiety disorder, unspecified: Secondary | ICD-10-CM | POA: Diagnosis present

## 2018-10-03 LAB — BASIC METABOLIC PANEL
Anion gap: 8 (ref 5–15)
BUN: 38 mg/dL — ABNORMAL HIGH (ref 8–23)
CO2: 23 mmol/L (ref 22–32)
Calcium: 8.9 mg/dL (ref 8.9–10.3)
Chloride: 107 mmol/L (ref 98–111)
Creatinine, Ser: 1.68 mg/dL — ABNORMAL HIGH (ref 0.61–1.24)
GFR calc Af Amer: 43 mL/min — ABNORMAL LOW (ref >60)
GFR calc non Af Amer: 37 mL/min — ABNORMAL LOW (ref >60)
Glucose, Bld: 57 mg/dL — ABNORMAL LOW (ref 70–99)
Potassium: 5 mmol/L (ref 3.5–5.1)
Sodium: 138 mmol/L (ref 135–145)

## 2018-10-03 LAB — PROTIME-INR
INR: 1.3 — ABNORMAL HIGH (ref 0.8–1.2)
Prothrombin Time: 15.6 seconds — ABNORMAL HIGH (ref 11.4–15.2)

## 2018-10-03 LAB — SARS CORONAVIRUS 2 BY RT PCR (HOSPITAL ORDER, PERFORMED IN ~~LOC~~ HOSPITAL LAB): SARS Coronavirus 2: NEGATIVE

## 2018-10-03 LAB — CBC
HCT: 37.3 % — ABNORMAL LOW (ref 39.0–52.0)
Hemoglobin: 11.6 g/dL — ABNORMAL LOW (ref 13.0–17.0)
MCH: 28.5 pg (ref 26.0–34.0)
MCHC: 31.1 g/dL (ref 30.0–36.0)
MCV: 91.6 fL (ref 80.0–100.0)
Platelets: 313 10*3/uL (ref 150–400)
RBC: 4.07 MIL/uL — ABNORMAL LOW (ref 4.22–5.81)
RDW: 17.1 % — ABNORMAL HIGH (ref 11.5–15.5)
WBC: 13.9 10*3/uL — ABNORMAL HIGH (ref 4.0–10.5)
nRBC: 0 % (ref 0.0–0.2)

## 2018-10-03 LAB — HEPARIN LEVEL (UNFRACTIONATED): Heparin Unfractionated: 1.04 IU/mL — ABNORMAL HIGH (ref 0.30–0.70)

## 2018-10-03 LAB — GLUCOSE, CAPILLARY: Glucose-Capillary: 154 mg/dL — ABNORMAL HIGH (ref 70–99)

## 2018-10-03 LAB — APTT: aPTT: 33 seconds (ref 24–36)

## 2018-10-03 LAB — ABO/RH: ABO/RH(D): A POS

## 2018-10-03 MED ORDER — INSULIN DETEMIR 100 UNIT/ML ~~LOC~~ SOLN
42.0000 [IU] | Freq: Every day | SUBCUTANEOUS | Status: DC
  Administered 2018-10-03: 42 [IU] via SUBCUTANEOUS
  Filled 2018-10-03 (×2): qty 0.42

## 2018-10-03 MED ORDER — NITROGLYCERIN 0.4 MG SL SUBL: 0.4000 mg | SUBLINGUAL_TABLET | SUBLINGUAL | Status: DC | PRN

## 2018-10-03 MED ORDER — MONTELUKAST SODIUM 10 MG PO TABS
10.0000 mg | ORAL_TABLET | Freq: Every day | ORAL | Status: DC
  Administered 2018-10-03 – 2018-10-12 (×9): 10 mg via ORAL
  Filled 2018-10-03 (×9): qty 1

## 2018-10-03 MED ORDER — POTASSIUM CHLORIDE CRYS ER 20 MEQ PO TBCR
80.0000 meq | EXTENDED_RELEASE_TABLET | Freq: Two times a day (BID) | ORAL | Status: DC
  Administered 2018-10-04 – 2018-10-05 (×4): 80 meq via ORAL
  Filled 2018-10-03 (×4): qty 4

## 2018-10-03 MED ORDER — DICLOFENAC SODIUM 1 % TD GEL
4.0000 g | Freq: Four times a day (QID) | TRANSDERMAL | Status: DC
  Administered 2018-10-03 – 2018-10-13 (×11): 4 g via TOPICAL
  Filled 2018-10-03: qty 100

## 2018-10-03 MED ORDER — CITALOPRAM HYDROBROMIDE 20 MG PO TABS
10.0000 mg | ORAL_TABLET | Freq: Every day | ORAL | Status: DC
  Administered 2018-10-04 – 2018-10-13 (×9): 10 mg via ORAL
  Filled 2018-10-03 (×9): qty 1

## 2018-10-03 MED ORDER — LEVOTHYROXINE SODIUM 50 MCG PO TABS
50.0000 ug | ORAL_TABLET | Freq: Every day | ORAL | Status: DC
  Administered 2018-10-04 – 2018-10-13 (×9): 50 ug via ORAL
  Filled 2018-10-03 (×9): qty 1

## 2018-10-03 MED ORDER — LATANOPROST 0.005 % OP SOLN
1.0000 [drp] | Freq: Every day | OPHTHALMIC | Status: DC
  Administered 2018-10-03 – 2018-10-12 (×10): 1 [drp] via OPHTHALMIC
  Filled 2018-10-03: qty 2.5

## 2018-10-03 MED ORDER — EZETIMIBE 10 MG PO TABS
10.0000 mg | ORAL_TABLET | Freq: Every day | ORAL | Status: DC
  Administered 2018-10-03 – 2018-10-13 (×10): 10 mg via ORAL
  Filled 2018-10-03 (×10): qty 1

## 2018-10-03 MED ORDER — BRIMONIDINE TARTRATE 0.2 % OP SOLN
1.0000 [drp] | Freq: Every day | OPHTHALMIC | Status: DC
  Administered 2018-10-03 – 2018-10-13 (×11): 1 [drp] via OPHTHALMIC
  Filled 2018-10-03: qty 5

## 2018-10-03 MED ORDER — ACETAMINOPHEN 325 MG PO TABS
650.0000 mg | ORAL_TABLET | Freq: Four times a day (QID) | ORAL | Status: DC | PRN
  Administered 2018-10-04: 21:00:00 650 mg via ORAL
  Filled 2018-10-03: qty 2

## 2018-10-03 MED ORDER — DORZOLAMIDE HCL 2 % OP SOLN
1.0000 [drp] | Freq: Three times a day (TID) | OPHTHALMIC | Status: DC
  Administered 2018-10-03 – 2018-10-13 (×26): 1 [drp] via OPHTHALMIC
  Filled 2018-10-03 (×2): qty 10

## 2018-10-03 MED ORDER — PREDNISONE 10 MG PO TABS
10.0000 mg | ORAL_TABLET | Freq: Every day | ORAL | Status: DC
  Administered 2018-10-04 – 2018-10-13 (×9): 10 mg via ORAL
  Filled 2018-10-03 (×10): qty 1

## 2018-10-03 MED ORDER — TRAMADOL HCL 50 MG PO TABS
50.0000 mg | ORAL_TABLET | Freq: Two times a day (BID) | ORAL | Status: DC
  Administered 2018-10-03 – 2018-10-12 (×16): 50 mg via ORAL
  Filled 2018-10-03 (×19): qty 1

## 2018-10-03 MED ORDER — HYDROCODONE-ACETAMINOPHEN 5-325 MG PO TABS
1.0000 | ORAL_TABLET | Freq: Four times a day (QID) | ORAL | Status: DC | PRN
  Administered 2018-10-04 – 2018-10-06 (×8): 1 via ORAL
  Filled 2018-10-03 (×8): qty 1

## 2018-10-03 MED ORDER — INSULIN ASPART 100 UNIT/ML ~~LOC~~ SOLN: 20.0000 [IU] | Freq: Every day | SUBCUTANEOUS | Status: DC

## 2018-10-03 MED ORDER — INSULIN ASPART 100 UNIT/ML ~~LOC~~ SOLN: 18.0000 [IU] | Freq: Every day | SUBCUTANEOUS | Status: DC

## 2018-10-03 MED ORDER — ALBUTEROL SULFATE (2.5 MG/3ML) 0.083% IN NEBU: 2.5000 mg | INHALATION_SOLUTION | RESPIRATORY_TRACT | Status: DC | PRN

## 2018-10-03 MED ORDER — GABAPENTIN 100 MG PO CAPS
100.0000 mg | ORAL_CAPSULE | Freq: Every day | ORAL | Status: DC
  Administered 2018-10-03 – 2018-10-12 (×10): 100 mg via ORAL
  Filled 2018-10-03 (×10): qty 1

## 2018-10-03 MED ORDER — COLCHICINE 0.6 MG PO TABS: 0.6000 mg | ORAL_TABLET | Freq: Every day | ORAL | Status: DC | PRN

## 2018-10-03 MED ORDER — ATORVASTATIN CALCIUM 20 MG PO TABS
40.0000 mg | ORAL_TABLET | Freq: Every day | ORAL | Status: DC
  Administered 2018-10-03 – 2018-10-12 (×9): 40 mg via ORAL
  Filled 2018-10-03 (×9): qty 2

## 2018-10-03 MED ORDER — VANCOMYCIN HCL 1.25 G IV SOLR: 1250.0000 mg | INTRAVENOUS | Status: DC

## 2018-10-03 MED ORDER — ENOXAPARIN SODIUM 40 MG/0.4ML ~~LOC~~ SOLN: 40.0000 mg | SUBCUTANEOUS | Status: DC

## 2018-10-03 MED ORDER — FINASTERIDE 5 MG PO TABS
5.0000 mg | ORAL_TABLET | Freq: Every day | ORAL | Status: DC
  Administered 2018-10-04 – 2018-10-13 (×9): 5 mg via ORAL
  Filled 2018-10-03 (×9): qty 1

## 2018-10-03 MED ORDER — TORSEMIDE 20 MG PO TABS
40.0000 mg | ORAL_TABLET | Freq: Two times a day (BID) | ORAL | Status: DC
  Administered 2018-10-04 – 2018-10-13 (×14): 40 mg via ORAL
  Filled 2018-10-03 (×16): qty 2

## 2018-10-03 MED ORDER — ALPRAZOLAM 0.25 MG PO TABS
0.2500 mg | ORAL_TABLET | Freq: Every day | ORAL | Status: DC
  Administered 2018-10-03 – 2018-10-12 (×10): 0.25 mg via ORAL
  Filled 2018-10-03 (×10): qty 1

## 2018-10-03 MED ORDER — MOMETASONE FURO-FORMOTEROL FUM 200-5 MCG/ACT IN AERO
2.0000 | INHALATION_SPRAY | Freq: Two times a day (BID) | RESPIRATORY_TRACT | Status: DC
  Administered 2018-10-03 – 2018-10-13 (×20): 2 via RESPIRATORY_TRACT
  Filled 2018-10-03: qty 8.8

## 2018-10-03 MED ORDER — METOPROLOL SUCCINATE ER 50 MG PO TB24
50.0000 mg | ORAL_TABLET | Freq: Two times a day (BID) | ORAL | Status: DC
  Administered 2018-10-03 – 2018-10-13 (×18): 50 mg via ORAL
  Filled 2018-10-03 (×19): qty 1

## 2018-10-03 MED ORDER — ONDANSETRON HCL 4 MG PO TABS
4.0000 mg | ORAL_TABLET | Freq: Four times a day (QID) | ORAL | Status: DC | PRN
  Administered 2018-10-07 – 2018-10-10 (×3): 4 mg via ORAL
  Filled 2018-10-03 (×3): qty 1

## 2018-10-03 MED ORDER — TRAZODONE HCL 100 MG PO TABS
100.0000 mg | ORAL_TABLET | Freq: Every evening | ORAL | Status: DC | PRN
  Administered 2018-10-05 – 2018-10-13 (×4): 100 mg via ORAL
  Filled 2018-10-03 (×4): qty 1

## 2018-10-03 MED ORDER — TIMOLOL MALEATE 0.5 % OP SOLN
1.0000 [drp] | Freq: Every day | OPHTHALMIC | Status: DC
  Administered 2018-10-03 – 2018-10-13 (×11): 1 [drp] via OPHTHALMIC
  Filled 2018-10-03: qty 5

## 2018-10-03 MED ORDER — VANCOMYCIN HCL 10 G IV SOLR
2000.0000 mg | Freq: Once | INTRAVENOUS | Status: AC
  Administered 2018-10-04: 2000 mg via INTRAVENOUS
  Filled 2018-10-03: qty 2000

## 2018-10-03 MED ORDER — INSULIN ASPART 100 UNIT/ML ~~LOC~~ SOLN: 16.0000 [IU] | Freq: Every day | SUBCUTANEOUS | Status: DC

## 2018-10-03 MED ORDER — ACETAMINOPHEN 650 MG RE SUPP: 650.0000 mg | Freq: Four times a day (QID) | RECTAL | Status: DC | PRN

## 2018-10-03 MED ORDER — UMECLIDINIUM BROMIDE 62.5 MCG/INH IN AEPB
1.0000 | INHALATION_SPRAY | Freq: Every day | RESPIRATORY_TRACT | Status: DC
  Administered 2018-10-03 – 2018-10-13 (×11): 1 via RESPIRATORY_TRACT
  Filled 2018-10-03: qty 7

## 2018-10-03 MED ORDER — ONDANSETRON HCL 4 MG/2ML IJ SOLN
4.0000 mg | Freq: Four times a day (QID) | INTRAMUSCULAR | Status: DC | PRN
  Administered 2018-10-11: 4 mg via INTRAVENOUS
  Filled 2018-10-03: qty 2

## 2018-10-03 MED ORDER — SODIUM CHLORIDE 0.9 % IV SOLN
2.0000 g | Freq: Two times a day (BID) | INTRAVENOUS | Status: DC
  Administered 2018-10-03 – 2018-10-06 (×7): 2 g via INTRAVENOUS
  Filled 2018-10-03 (×10): qty 2

## 2018-10-03 MED ORDER — BRIMONIDINE TARTRATE-TIMOLOL 0.2-0.5 % OP SOLN: 1.0000 [drp] | Freq: Every day | OPHTHALMIC | Status: DC

## 2018-10-03 MED ORDER — HEPARIN (PORCINE) 25000 UT/250ML-% IV SOLN
1250.0000 [IU]/h | INTRAVENOUS | Status: DC
  Administered 2018-10-03: 1100 [IU]/h via INTRAVENOUS
  Administered 2018-10-04 – 2018-10-05 (×2): 1250 [IU]/h via INTRAVENOUS
  Filled 2018-10-03 (×3): qty 250

## 2018-10-03 MED ORDER — TAMSULOSIN HCL 0.4 MG PO CAPS
0.4000 mg | ORAL_CAPSULE | Freq: Every day | ORAL | Status: DC
  Administered 2018-10-04 – 2018-10-13 (×9): 0.4 mg via ORAL
  Filled 2018-10-03 (×9): qty 1

## 2018-10-03 NOTE — H&P (Signed)
[facility] [place] NAME: [name]    MR#:  [id number]  DATE OF BIRTH:  [birth year]  DATE OF ADMISSION:  10/03/2018  PRIMARY CARE PHYSICIAN: Jearld Fenton, NP   REQUESTING/REFERRING PHYSICIAN: Dr Sharlotte Alamo  CHIEF COMPLAINT:  No chief complaint on file.   HISTORY OF PRESENT ILLNESS:  [name]  is a 85 y.o. male with a known history of diabetes with foot ulceration.  He states recently they amputated his toe and 2 weeks ago cut off more tissue.  I stated that Dr. Lucky Cowboy placed a larger stent and angioplasties recently.  He started having more drainage and foul smelling discharge.  His home health nurse took a picture and sent it to Dr. Cleda Mccreedy and he wanted the patient to be admitted for IV antibiotics and potential amputation.  Patient arrived for direct admission.  Currently feels okay.  PAST MEDICAL HISTORY:   Past Medical History:  Diagnosis Date   Arthritis    CAD    a. MI 01/29/1996 tx'd w/ TPA @ [facility]; b. Myoview 06/2005: EF 50%, scar @ apex, mild peri-infarct ischemia   Cancer ([place])    skin   Chronic atrial fibrillation    a. since 2006; b. on warfarin   Chronic diastolic CHF (congestive heart failure) ([place])    a. echo 04/2006: EF lower limits of nl, mod LVH, mild aortic root dilatation, & mild MR, biatrial enlargement; b. echo 04/2013: EF 60%, mod dilated LA, mild MR & TR, mod pulm HTN w/ RV systolic pressure 53, c. echo 04/21/14: EF 55-60%, unable to exclude WMA, severely dilated LA 6.6 cm, nl RVSP, mildly dilated aortic root   COPD (chronic obstructive pulmonary disease) (HCC)    oxygen prn at home   CVA 5361,4431   x2   DM    Falls    GERD (gastroesophageal reflux disease)    History of kidney stones    HYPERLIPIDEMIA    HYPERTENSION    Kidney stone    a. s/p left ureteral stenting 04/24/14   Left arm weakness    limited movement. S/P fall injury   Neuropathy of both feet    Poor balance      Wears dentures    full upper and lower    PAST SURGICAL HISTORY:   Past Surgical History:  Procedure Laterality Date   AMPUTATION TOE Left 06/13/2018   Procedure: 1st Ray Resection Left;  Surgeon: Sharlotte Alamo, DPM;  Location: [facility];  Service: Podiatry;  Laterality: Left;   BLADDER SURGERY     stent placement    [place]   CATARACT EXTRACTION W/PHACO Left 10/11/2017   Procedure: CATARACT EXTRACTION PHACO AND INTRAOCULAR LENS PLACEMENT ([place]) COMPLICATED LEFT DIABETIC;  Surgeon: Leandrew Koyanagi, MD;  Location: [facility];  Service: Ophthalmology;  Laterality: Left;  MALYUGIN Diabetic - insulin   CATARACT EXTRACTION W/PHACO Right 11/30/2017   Procedure: CATARACT EXTRACTION PHACO AND INTRAOCULAR LENS PLACEMENT ([place]) COMPLICATED  RIGHT DIABETIC;  Surgeon: Leandrew Koyanagi, MD;  Location: [facility];  Service: Ophthalmology;  Laterality: Right;  Diabetic - insulin   CIRCUMCISION  2016   CORONARY ANGIOPLASTY  1997   s/p stent placement x 2    CYSTOSCOPY W/ URETERAL STENT REMOVAL Left 10/09/2014   Procedure: CYSTOSCOPY WITH STENT REMOVAL;  Surgeon: Hollice Espy, MD;  Location: [facility];  Service: Urology;  Laterality: Left;  CYSTOSCOPY WITH STENT PLACEMENT Left 10/09/2014   Procedure: CYSTOSCOPY WITH STENT PLACEMENT;  Surgeon: Hollice Espy, MD;  Location: [facility];  Service: Urology;  Laterality: Left;   IRRIGATION AND DEBRIDEMENT FOOT Left 09/15/2018   Procedure: IRRIGATION AND DEBRIDEMENT FOOT DELAY CLOSURE;  Surgeon: Samara Deist, DPM;  Location: [facility];  Service: Podiatry;  Laterality: Left;   KIDNEY SURGERY  05/2013   s/p stent placement    LOWER EXTREMITY ANGIOGRAPHY Left 06/12/2018   Procedure: Lower Extremity Angiography;  Surgeon: Algernon Huxley, MD;  Location: [facility];  Service: Cardiovascular;  Laterality: Left;   LOWER EXTREMITY ANGIOGRAPHY Left 09/13/2018    Procedure: Lower Extremity Angiography;  Surgeon: Algernon Huxley, MD;  Location: [facility];  Service: Cardiovascular;  Laterality: Left;   stents ureters Bilateral    TONSILLECTOMY AND ADENOIDECTOMY  1959   URETEROSCOPY WITH HOLMIUM LASER LITHOTRIPSY Left 10/09/2014   Procedure: URETEROSCOPY WITH HOLMIUM LASER LITHOTRIPSY;  Surgeon: Hollice Espy, MD;  Location: [facility];  Service: Urology;  Laterality: Left;    SOCIAL HISTORY:   Social History   Tobacco Use   Smoking status: Former Smoker    Packs/day: 2.00    Years: 40.00    Pack years: 80.00    Types: Cigarettes    Quit date: 05/24/1990    Years since quitting: 28.3   Smokeless tobacco: Former Systems developer    Quit date: 05/24/1990  Substance Use Topics   Alcohol use: No    FAMILY HISTORY:   Family History  Problem Relation Age of Onset   Heart disease Mother    Diabetes Mother    Heart disease Maternal Grandmother    Diabetes Maternal Grandmother    Cancer Neg Hx    Stroke Neg Hx     DRUG ALLERGIES:   Allergies  Allergen Reactions   Contrast Media [Iodinated Diagnostic Agents] Shortness Of Breath   Morphine And Related Other (See Comments)    Hallucinations    Niacin And Related Dermatitis   Other     Other reaction(s): SHORTNESS OF BREATH   Amlodipine Rash    REVIEW OF SYSTEMS:  CONSTITUTIONAL: No fever, fatigue or weakness.  EYES: No blurred or double vision.  EARS, NOSE, AND THROAT: No tinnitus or ear pain. No sore throat.  Positive for runny nose.  Decreased hearing. RESPIRATORY: No cough.  Positive for shortness of breath with exertion.  No wheezing or hemoptysis.  CARDIOVASCULAR: No chest pain, orthopnea, edema.  GASTROINTESTINAL: No nausea, vomiting, diarrhea or abdominal pain. No blood in bowel movements GENITOURINARY: No dysuria, hematuria.  ENDOCRINE: No polyuria, nocturia,  HEMATOLOGY: No anemia, easy bruising or bleeding SKIN: No rash or lesion. MUSCULOSKELETAL: Left leg  pain. NEUROLOGIC: No tingling, numbness, weakness.  PSYCHIATRY: No anxiety or depression.   MEDICATIONS AT HOME:   Prior to Admission medications   Medication Sig Start Date End Date Taking? Authorizing Provider  acetaminophen (TYLENOL) 650 MG CR tablet Take 650 mg by mouth every 8 (eight) hours as needed for pain.     [provider]  albuterol (PROAIR HFA) 108 (90 Base) MCG/ACT inhaler USE 1 TO 2 INHALATIONS EVERY 4 HOURS AS NEEDED Patient taking differently: Inhale 1-2 puffs into the lungs every 4 (four) hours as needed for wheezing or shortness of breath.  11/09/17   Wilhelmina Mcardle, MD  ALPRAZolam Duanne Moron) 0.25 MG tablet Take 1 tablet (0.25 mg total) by mouth at bedtime as needed for anxiety. 07/19/18   Jearld Fenton,  NP  apixaban (ELIQUIS) 2.5 MG TABS tablet Take 1 tablet (2.5 mg total) by mouth 2 (two) times daily. 03/27/18   Minna Merritts, MD  atorvastatin (LIPITOR) 40 MG tablet TAKE 1 TABLET DAILY Patient taking differently: Take 40 mg by mouth daily at 6 PM.  07/17/18   Jearld Fenton, NP  citalopram (CELEXA) 10 MG tablet TAKE 1 TABLET DAILY 09/28/18   Jearld Fenton, NP  clotrimazole (LOTRIMIN) 1 % cream Apply 1 application topically 2 (two) times daily. 11/18/17   Bedsole, Amy E, MD  COLCRYS 0.6 MG tablet TAKE 1 TABLET TWICE A DAY AS NEEDED (ACUTE GOUT FLARE) USE AS DIRECTED 07/06/18   Baity, Coralie Keens, NP  COMBIGAN 0.2-0.5 % ophthalmic solution Place 1 drop into both eyes daily. 06/26/18   [provider]  diclofenac sodium (VOLTAREN) 1 % GEL Apply 4 g topically 4 (four) times daily. 01/05/18   Copland, Frederico Hamman, MD  dorzolamide (TRUSOPT) 2 % ophthalmic solution Place 1 drop into both eyes 3 (three) times daily.  03/31/16   [provider]  ELIQUIS 5 MG TABS tablet TAKE 1 TABLET TWICE A DAY 09/18/18   Minna Merritts, MD  ezetimibe (ZETIA) 10 MG tablet TAKE 1 TABLET DAILY 07/17/18   Jearld Fenton, NP  finasteride (PROSCAR) 5 MG tablet TAKE 1 TABLET  DAILY Patient taking differently: Take 5 mg by mouth daily.  06/29/17   Jearld Fenton, NP  gabapentin (NEURONTIN) 100 MG capsule TAKE 1 CAPSULE THREE TIMES A DAY Patient taking differently: Take 100 mg by mouth at bedtime.  06/09/18   Jearld Fenton, NP  insulin detemir (LEVEMIR) 100 UNIT/ML injection Inject 0.28 mLs (28 Units total) into the skin daily at 10 pm. Patient taking differently: Inject 42 Units into the skin daily at 10 pm.  06/15/18   Fritzi Mandes, MD  insulin lispro (HUMALOG) 100 UNIT/ML injection Inject 0.04 mLs (4 Units total) into the skin 3 (three) times daily with meals. Patient taking differently: Inject 10-18 Units into the skin 3 (three) times daily with meals. 18 am, 3 lunch, 10 supper 06/15/18   Fritzi Mandes, MD  Insulin Pen Needle (BD PEN NEEDLE NANO U/F) 32G X 4 MM MISC USE THREE TIMES A DAY FOR INSULIN ADMINISTRATION 02/15/18   Jearld Fenton, NP  Insulin Syringe-Needle U-100 30G X 1/2" 1 ML MISC 1 each by Does not apply route 3 (three) times daily. 07/19/14   Jearld Fenton, NP  latanoprost (XALATAN) 0.005 % ophthalmic solution Place 1 drop into both eyes at bedtime.  04/18/16   [provider]  levothyroxine (SYNTHROID) 50 MCG tablet TAKE 1 TABLET DAILY BEFORE BREAKFAST 09/25/18   Jearld Fenton, NP  metolazone (ZAROXOLYN) 5 MG tablet Take 1 tablet (5 mg total) by mouth daily as needed (swelling). 03/13/18   Minna Merritts, MD  metoprolol succinate (TOPROL-XL) 50 MG 24 hr tablet TAKE 1 TABLET TWICE A DAY 06/09/18   Jearld Fenton, NP  montelukast (SINGULAIR) 10 MG tablet TAKE 1 TABLET AT BEDTIME Patient taking differently: Take 10 mg by mouth at bedtime.  02/24/18   Jearld Fenton, NP  nitroGLYCERIN (NITROSTAT) 0.4 MG SL tablet Place 0.4 mg under the tongue every 5 (five) minutes as needed.      [provider]  nystatin-triamcinolone (MYCOLOG II) cream Apply 1 application topically 2 (two) times daily.  03/12/14   [provider]  potassium  chloride SA (K-DUR,KLOR-CON) 20 MEQ tablet Take  4 tablets (80 meq) by mouth twice daily, take an extra 1 tablet (20 meq) on the days you take metolazone Patient taking differently: Take 80 mEq by mouth 2 (two) times daily. Take 4 tablets (80 meq) by mouth twice daily at breakfast and at lunch, take an extra 1 tablet (20 meq) on the days you take metolazone put tablet in applesauce to aid swallowing 02/10/18   Minna Merritts, MD  predniSONE (DELTASONE) 10 MG tablet Take 1 tablet (10 mg total) by mouth daily with breakfast. 05/05/18   Jearld Fenton, NP  SYMBICORT 160-4.5 MCG/ACT inhaler USE 2 INHALATIONS TWICE A DAY Patient taking differently: Inhale 1 puff into the lungs 2 (two) times daily.  08/31/17   Wilhelmina Mcardle, MD  tamsulosin (FLOMAX) 0.4 MG CAPS capsule TAKE 1 CAPSULE DAILY 09/28/18   Jearld Fenton, NP  torsemide (DEMADEX) 20 MG tablet Take 40 mg by mouth 2 (two) times daily.    [provider]  traMADol (ULTRAM) 50 MG tablet Take 1 tablet (50 mg total) by mouth 2 (two) times daily. 06/29/18   Jearld Fenton, NP  traZODone (DESYREL) 50 MG tablet Take 2 tablets (100 mg total) by mouth at bedtime as needed for sleep. 02/16/18   Jearld Fenton, NP  umeclidinium bromide (INCRUSE ELLIPTA) 62.5 MCG/INH AEPB Inhale 1 puff into the lungs daily.    [provider]      VITAL SIGNS:  Blood pressure 110/81, pulse 82, temperature 98.1 F (36.7 C), temperature source Oral, resp. rate 18, height 5\' 6"  (1.676 m), weight 84.8 kg, SpO2 98 %.  PHYSICAL EXAMINATION:  GENERAL:  85 y.o.-year-old patient lying in the bed with no acute distress.  EYES: Pupils equal, round, reactive to light and accommodation. No scleral icterus. Extraocular muscles intact.  HEENT: Head atraumatic, normocephalic. Oropharynx and nasopharynx clear.  NECK:  Supple, no jugular venous distention. No thyroid enlargement, no tenderness.  LUNGS: Normal breath sounds bilaterally, no wheezing, rales,rhonchi or  crepitation. No use of accessory muscles of respiration.  CARDIOVASCULAR: S1, S2 normal. No murmurs, rubs, or gallops.  ABDOMEN: Soft, nontender, nondistended. Bowel sounds present. No organomegaly or mass.  EXTREMITIES: No pedal edema, cyanosis, or clubbing.  NEUROLOGIC: Cranial nerves II through XII are intact. Muscle strength 5/5 in all extremities. Sensation intact. Gait not checked.  PSYCHIATRIC: The patient is alert and oriented x 3.  SKIN: See pictures       EKG and laboratory data ordered by me  IMPRESSION AND PLAN:   1.  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  We will get vascular surgery and podiatry consultations to decide what to do next.  We will give empiric antibiotics with cefepime and vancomycin.  We will get admission his labs and EKG.  Surgery will need to be done to prevent sepsis and death. 2.  Type 2 diabetes mellitus.  Continue his usual schedule with Levemir and Humalog insulin. 3.  Chronic atrial fibrillation.  Come off Eliquis in anticipation of amputation and placed on heparin drip. 4.  Chronic diastolic congestive heart failure.  No signs of heart failure currently. 5.  Hypertension continue usual medications 6.  Hyperlipidemia unspecified continue usual medications 7.  Anxiety and depression continue usual medications    All the records are reviewed and case discussed with ED provider. Management plans discussed with the patient, family and they are in agreement.  CODE STATUS: Full code  TOTAL TIME TAKING  CARE OF THIS PATIENT: 50 minutes.    Loletha Grayer M.D on 10/03/2018 at 7:06 PM  Between 7am to 6pm - Pager - [phone]  After 6pm call admission pager [phone]  [facility]  [phone]  CC: Primary care physician; Jearld Fenton, NP

## 2018-10-03 NOTE — Consult Note (Signed)
Pharmacy Antibiotic Note  Alonso Gapinski. is a 85 y.o. male admitted on 10/03/2018 with wound infection.  Pharmacy has been consulted for Cefepime and Vancomycin dosing. Patient recently had great toe amputated ~2 weeks ago.   BMI 30.17   Plan: 1) Cefepime 2 g Q12h  2) Vancomycin 2000 mg x1 dose. Will start maintenance dose of 1250 mg Q48H will start 06/25 @ 2200. Will obtain Scr level with AM labs  AUC Goal 400-550 Expected AUC 509.8   Height: 5\' 6"  (167.6 cm) Weight: 186 lb 15.2 oz (84.8 kg) IBW/kg (Calculated) : 63.8  Temp (24hrs), Avg:98.1 F (36.7 C), Min:98.1 F (36.7 C), Max:98.1 F (36.7 C)  Recent Labs  Lab 10/03/18 1918  WBC 13.9*  CREATININE 1.68*    Estimated Creatinine Clearance: 33.4 mL/min (A) (by C-G formula based on SCr of 1.68 mg/dL (H)).    Allergies  Allergen Reactions  . Contrast Media [Iodinated Diagnostic Agents] Shortness Of Breath  . Morphine And Related Other (See Comments)    Hallucinations   . Niacin And Related Dermatitis  . Other     Other reaction(s): SHORTNESS OF BREATH  . Amlodipine Rash    Antimicrobials this admission: 6/23 Vancomycin >>  6/23 Cefepime >>  Dose adjustments this admission: N/A  Microbiology results: N/A  Thank you for allowing pharmacy to be a part of this patient's care.  Rowland Lathe 10/03/2018 7:59 PM

## 2018-10-03 NOTE — Consult Note (Deleted)
ANTICOAGULATION CONSULT NOTE - Follow Up Consult  Pharmacy Consult for Heparin gtt Indication: atrial fibrillation  Allergies  Allergen Reactions  . Contrast Media [Iodinated Diagnostic Agents] Shortness Of Breath  . Morphine And Related Other (See Comments)    Hallucinations   . Niacin And Related Dermatitis  . Other     Other reaction(s): SHORTNESS OF BREATH  . Amlodipine Rash    Patient Measurements: Height: 5\' 6"  (167.6 cm) Weight: 186 lb 15.2 oz (84.8 kg) IBW/kg (Calculated) : 63.8 Heparin Dosing Weight: 81.3 kg   Vital Signs: Temp: 98.1 F (36.7 C) (06/23 1746) Temp Source: Oral (06/23 1746) BP: 110/81 (06/23 1746) Pulse Rate: 82 (06/23 1746)  Labs: Recent Labs    10/03/18 1918  HGB 11.6*  HCT 37.3*  PLT 313    Estimated Creatinine Clearance: 49.7 mL/min (by C-G formula based on SCr of 1.13 mg/dL).   Medications:  PTA Eliquis - last dose taken 6/23 ~ 9 am   Assessment: Patient has a PMH significant for atrial fibrillation. Patient was switched to heparin in anticipation of amputation.  Goal of Therapy:  aPTT 0.3-0.7 seconds (corresponds to Heparin level 0.3-0.7 units/ml)  Monitor platelets by anticoagulation protocol: Yes   Plan:   Rowland Lathe 10/03/2018,7:33 PM

## 2018-10-03 NOTE — Consult Note (Signed)
ANTICOAGULATION CONSULT NOTE - Follow Up Consult  Pharmacy Consult for Heparin gtt Indication: atrial fibrillation  Allergies  Allergen Reactions  . Contrast Media [Iodinated Diagnostic Agents] Shortness Of Breath  . Morphine And Related Other (See Comments)    Hallucinations   . Niacin And Related Dermatitis  . Other     Other reaction(s): SHORTNESS OF BREATH  . Amlodipine Rash    Patient Measurements: Height: 5\' 6"  (167.6 cm) Weight: 186 lb 15.2 oz (84.8 kg) IBW/kg (Calculated) : 63.8 Heparin Dosing Weight: 81.3 kg   Vital Signs: Temp: 98.1 F (36.7 C) (06/23 1746) Temp Source: Oral (06/23 1746) BP: 110/81 (06/23 1746) Pulse Rate: 82 (06/23 1746)  Labs: Recent Labs    10/03/18 1918  HGB 11.6*  HCT 37.3*  PLT 313  APTT 33  LABPROT 15.6*  INR 1.3*  CREATININE 1.68*    Estimated Creatinine Clearance: 33.4 mL/min (A) (by C-G formula based on SCr of 1.68 mg/dL (H)).   Medications:  PTA Eliquis - last dose taken 6/23 ~ 9 am   Assessment: Patient has a PMH significant for atrial fibrillation. Patient was switched to heparin in anticipation of amputation.  If HL < 0.1, use only HLs to titrate heparin infusion. If HL > 0.1 and does not correlate with aPTT, then will use aPTT levels.  Given patient's last dose has been <12 hours, will not need to bolus heparin.   Goal of Therapy:  aPTT 0.3-0.7 seconds (corresponds to Heparin level 0.3-0.7 units/ml). Monitor platelets by anticoagulation protocol: Yes   Plan:  Baseline labs have been ordered  Start heparin infusion at 1100 units/hr Check anti-Xa level in 8 hours and daily while on heparin, per protocol Continue to monitor H&H and platelets   Soliana Kitko R Dellas Guard 10/03/2018,7:48 PM

## 2018-10-04 DIAGNOSIS — Z79899 Other long term (current) drug therapy: Secondary | ICD-10-CM

## 2018-10-04 DIAGNOSIS — L97529 Non-pressure chronic ulcer of other part of left foot with unspecified severity: Secondary | ICD-10-CM

## 2018-10-04 DIAGNOSIS — I739 Peripheral vascular disease, unspecified: Secondary | ICD-10-CM

## 2018-10-04 DIAGNOSIS — Z7901 Long term (current) use of anticoagulants: Secondary | ICD-10-CM

## 2018-10-04 DIAGNOSIS — Z87891 Personal history of nicotine dependence: Secondary | ICD-10-CM

## 2018-10-04 DIAGNOSIS — Z95820 Peripheral vascular angioplasty status with implants and grafts: Secondary | ICD-10-CM

## 2018-10-04 DIAGNOSIS — Z89412 Acquired absence of left great toe: Secondary | ICD-10-CM

## 2018-10-04 DIAGNOSIS — E1151 Type 2 diabetes mellitus with diabetic peripheral angiopathy without gangrene: Secondary | ICD-10-CM

## 2018-10-04 DIAGNOSIS — E785 Hyperlipidemia, unspecified: Secondary | ICD-10-CM

## 2018-10-04 LAB — CBC
HCT: 35.1 % — ABNORMAL LOW (ref 39.0–52.0)
Hemoglobin: 10.7 g/dL — ABNORMAL LOW (ref 13.0–17.0)
MCH: 28.5 pg (ref 26.0–34.0)
MCHC: 30.5 g/dL (ref 30.0–36.0)
MCV: 93.6 fL (ref 80.0–100.0)
Platelets: 265 10*3/uL (ref 150–400)
RBC: 3.75 MIL/uL — ABNORMAL LOW (ref 4.22–5.81)
RDW: 17.2 % — ABNORMAL HIGH (ref 11.5–15.5)
WBC: 9.9 10*3/uL (ref 4.0–10.5)
nRBC: 0 % (ref 0.0–0.2)

## 2018-10-04 LAB — APTT
aPTT: 49 seconds — ABNORMAL HIGH (ref 24–36)
aPTT: 67 seconds — ABNORMAL HIGH (ref 24–36)

## 2018-10-04 LAB — BASIC METABOLIC PANEL
Anion gap: 8 (ref 5–15)
BUN: 33 mg/dL — ABNORMAL HIGH (ref 8–23)
CO2: 23 mmol/L (ref 22–32)
Calcium: 8.4 mg/dL — ABNORMAL LOW (ref 8.9–10.3)
Chloride: 106 mmol/L (ref 98–111)
Creatinine, Ser: 1.44 mg/dL — ABNORMAL HIGH (ref 0.61–1.24)
GFR calc Af Amer: 51 mL/min — ABNORMAL LOW (ref >60)
GFR calc non Af Amer: 44 mL/min — ABNORMAL LOW (ref >60)
Glucose, Bld: 178 mg/dL — ABNORMAL HIGH (ref 70–99)
Potassium: 4.3 mmol/L (ref 3.5–5.1)
Sodium: 137 mmol/L (ref 135–145)

## 2018-10-04 LAB — GLUCOSE, CAPILLARY
Glucose-Capillary: 72 mg/dL (ref 70–99)
Glucose-Capillary: 118 mg/dL — ABNORMAL HIGH (ref 70–99)
Glucose-Capillary: 267 mg/dL — ABNORMAL HIGH (ref 70–99)
Glucose-Capillary: 256 mg/dL — ABNORMAL HIGH (ref 70–99)

## 2018-10-04 LAB — HEPARIN LEVEL (UNFRACTIONATED): Heparin Unfractionated: 0.64 IU/mL (ref 0.30–0.70)

## 2018-10-04 LAB — PROTIME-INR
INR: 1.3 — ABNORMAL HIGH (ref 0.8–1.2)
Prothrombin Time: 15.7 seconds — ABNORMAL HIGH (ref 11.4–15.2)

## 2018-10-04 MED ORDER — VANCOMYCIN HCL IN DEXTROSE 750-5 MG/150ML-% IV SOLN
750.0000 mg | INTRAVENOUS | Status: DC
  Administered 2018-10-06 – 2018-10-07 (×2): 750 mg via INTRAVENOUS
  Filled 2018-10-04 (×3): qty 150

## 2018-10-04 MED ORDER — SODIUM CHLORIDE 0.9 % IV SOLN
INTRAVENOUS | Status: DC | PRN
  Administered 2018-10-04: 250 mL via INTRAVENOUS

## 2018-10-04 MED ORDER — HEPARIN BOLUS VIA INFUSION
1200.0000 [IU] | Freq: Once | INTRAVENOUS | Status: AC
  Administered 2018-10-04: 1200 [IU] via INTRAVENOUS
  Filled 2018-10-04: qty 1200

## 2018-10-04 MED ORDER — INSULIN ASPART 100 UNIT/ML ~~LOC~~ SOLN
0.0000 [IU] | Freq: Every day | SUBCUTANEOUS | Status: DC
  Administered 2018-10-04: 23:00:00 3 [IU] via SUBCUTANEOUS
  Administered 2018-10-05: 2 [IU] via SUBCUTANEOUS
  Administered 2018-10-07: 3 [IU] via SUBCUTANEOUS
  Administered 2018-10-08: 5 [IU] via SUBCUTANEOUS
  Administered 2018-10-09: 3 [IU] via SUBCUTANEOUS
  Filled 2018-10-04 (×5): qty 1

## 2018-10-04 MED ORDER — INSULIN DETEMIR 100 UNIT/ML ~~LOC~~ SOLN
30.0000 [IU] | Freq: Every day | SUBCUTANEOUS | Status: DC
  Administered 2018-10-04 – 2018-10-06 (×3): 30 [IU] via SUBCUTANEOUS
  Filled 2018-10-04 (×5): qty 0.3

## 2018-10-04 MED ORDER — INSULIN ASPART 100 UNIT/ML ~~LOC~~ SOLN
4.0000 [IU] | Freq: Three times a day (TID) | SUBCUTANEOUS | Status: DC
  Administered 2018-10-04 – 2018-10-05 (×2): 4 [IU] via SUBCUTANEOUS
  Filled 2018-10-04 (×2): qty 1

## 2018-10-04 MED ORDER — INSULIN ASPART 100 UNIT/ML ~~LOC~~ SOLN
0.0000 [IU] | Freq: Three times a day (TID) | SUBCUTANEOUS | Status: DC
  Administered 2018-10-04: 5 [IU] via SUBCUTANEOUS
  Administered 2018-10-05: 9 [IU] via SUBCUTANEOUS
  Administered 2018-10-05: 2 [IU] via SUBCUTANEOUS
  Administered 2018-10-06: 3 [IU] via SUBCUTANEOUS
  Administered 2018-10-07: 2 [IU] via SUBCUTANEOUS
  Administered 2018-10-07: 3 [IU] via SUBCUTANEOUS
  Administered 2018-10-08: 2 [IU] via SUBCUTANEOUS
  Filled 2018-10-04 (×7): qty 1

## 2018-10-04 NOTE — Progress Notes (Signed)
PT Cancellation Note  Patient Details Name: [name]. MRN: [id number] DOB: [birth year]   Cancelled Treatment:    Reason Eval/Treat Not Completed: Other (comment).  PT consult received.  Chart reviewed.  Per vascular note, plan for BKA Friday.  Per secure text with vascular (MD Dew), will sign off from PT at this time (discontinue PT order) and wait for new PT eval order post-op.  Leitha Bleak, PT 10/04/18, 1:20 PM [phone]

## 2018-10-04 NOTE — Consult Note (Addendum)
ANTICOAGULATION CONSULT NOTE - Follow Up Consult  Pharmacy Consult for Heparin gtt Indication: atrial fibrillation  Allergies  Allergen Reactions  . Contrast Media [Iodinated Diagnostic Agents] Shortness Of Breath  . Morphine And Related Other (See Comments)    Hallucinations   . Niacin And Related Dermatitis  . Other     Other reaction(s): SHORTNESS OF BREATH  . Amlodipine Rash    Patient Measurements: Height: 5\' 6"  (167.6 cm) Weight: 186 lb 15.2 oz (84.8 kg) IBW/kg (Calculated) : 63.8 Heparin Dosing Weight: 81.3 kg   Vital Signs: Temp: 97.5 F (36.4 C) (06/24 0739) Temp Source: Oral (06/24 0739) BP: 118/54 (06/24 0739) Pulse Rate: 108 (06/24 0739)  Labs: Recent Labs    10/03/18 1918 10/04/18 0347 10/04/18 0749  HGB 11.6* 10.7*  --   HCT 37.3* 35.1*  --   PLT 313 265  --   APTT 33  --  49*  LABPROT 15.6*  --   --   INR 1.3*  --   --   HEPARINUNFRC 1.04*  --  0.64  CREATININE 1.68* 1.44*  --     Estimated Creatinine Clearance: 39 mL/min (A) (by C-G formula based on SCr of 1.44 mg/dL (H)).   Medications:  PTA Eliquis - last dose taken 6/23 ~ 9 am   Assessment: Patient has a PMH significant for atrial fibrillation. Patient was switched to heparin in anticipation of amputation.  If HL < 0.1, use only HLs to titrate heparin infusion. If HL > 0.1 and does not correlate with aPTT, then will use aPTT levels.  6/23: Started heparin infusion at 1100 units/hr  Goal of Therapy:  aPTT 66-102 seconds (corresponds to Heparin level 0.3-0.7 units/ml). Monitor platelets by anticoagulation protocol: Yes   Plan:  6/24 0749: HL 0.64, aPTT 49 (subtherapeutic, not yet correlating) RN confirms heparin running at 11 ml/hr, no s/sx of bleeding noted.  Will order heparin bolus 1200 units IV x1 and increase heparin drip to 1250 units/hr (=12.5 ml/hr) Check aPTT level in 8 hours. HL and CBC in AM, per protocol Continue to monitor H&H and platelets   Rayna Sexton  L 10/04/2018,9:01 AM

## 2018-10-04 NOTE — Progress Notes (Signed)
[facility] [place] NAME: [name]    MR#:  [id number]  DATE OF BIRTH:  Sep 28, 1931  SUBJECTIVE:   seen by dr troxler this am   REVIEW OF SYSTEMS:    Review of Systems  Constitutional: Negative for fever, chills weight loss HENT: Negative for ear pain, nosebleeds, congestion, facial swelling, rhinorrhea, neck pain, neck stiffness and ear discharge.   Respiratory: Negative for cough, shortness of breath, wheezing  Cardiovascular: Negative for chest pain, palpitations and leg swelling.  Gastrointestinal: Negative for heartburn, abdominal pain, vomiting, diarrhea or consitpation Genitourinary: Negative for dysuria, urgency, frequency, hematuria Musculoskeletal: Negative for back pain or joint pain Neurological: Negative for dizziness, seizures, syncope, focal weakness,  numbness and headaches.  Hematological: Does not bruise/bleed easily.  Psychiatric/Behavioral: Negative for hallucinations, confusion, dysphoric mood SKIN  Redness left lower leg/foot previous 1st ray amputation    Tolerating Diet: yes      DRUG ALLERGIES:   Allergies  Allergen Reactions  . Contrast Media [Iodinated Diagnostic Agents] Shortness Of Breath  . Morphine And Related Other (See Comments)    Hallucinations   . Niacin And Related Dermatitis  . Other     Other reaction(s): SHORTNESS OF BREATH  . Amlodipine Rash    VITALS:  Blood pressure 125/62, pulse (!) 105, temperature (!) 97.5 F (36.4 C), temperature source Oral, resp. rate 17, height 5\' 6"  (1.676 m), weight 84.8 kg, SpO2 95 %.  PHYSICAL EXAMINATION:  Constitutional: Appears well-developed and well-nourished. No distress. HENT: Normocephalic. Marland Kitchen Oropharynx is clear and moist.  Eyes: Conjunctivae and EOM are normal. PERRLA, no scleral icterus.  Neck: Normal ROM. Neck supple. No JVD. No tracheal deviation. CVS: RRR, S1/S2 +, no murmurs, no gallops, no carotid bruit.  Pulmonary: Effort and  breath sounds normal, no stridor, rhonchi, wheezes, rales.  Abdominal: Soft. BS +,  no distension, tenderness, rebound or guarding.  Musculoskeletal: Normal range of motion. No edema and no tenderness.  Neuro: Alert. CN 2-12 grossly intact. No focal deficits. Skin: Leg is currently dressed As per podiatry note:Patient has erythema to the lower leg and foot throughout the left foot and lower leg.  At the previous first ray amputation site there is necrosis along the incision margin with a complete lack of healing.  Some drainage.  Not appear to be grossly infected.  Couple smaller ulcers on the dorsum of the foot as well. Psychiatric: Normal mood and affect.      LABORATORY PANEL:   CBC Recent Labs  Lab 10/04/18 0347  WBC 9.9  HGB 10.7*  HCT 35.1*  PLT 265   ------------------------------------------------------------------------------------------------------------------  Chemistries  Recent Labs  Lab 10/04/18 0347  NA 137  K 4.3  CL 106  CO2 23  GLUCOSE 178*  BUN 33*  CREATININE 1.44*  CALCIUM 8.4*   ------------------------------------------------------------------------------------------------------------------  Cardiac Enzymes No results for input(s): TROPONINI in the last 168 hours. ------------------------------------------------------------------------------------------------------------------  RADIOLOGY:  No results found.   ASSESSMENT AND PLAN:   85 year old male with history of diabetes and first toe amputation with recent stent and angioplasty who presented to the hospital after being evaluated by home health nurse with concerns of incision necrosis.   1.  Diabetic left leg and foot infection with recent first ray amputation: Patient will likely need below-knee amputation Dr. dew scheduled to see patient today for second opinion nonhealing.  Continue cefepime and vancomycin.  2.  Diabetes: Continue Levemir, NovoLog, sliding scale and ADA diet  3.  Hypothyroidism: Continue Synthroid  4.  Essential hypertension: Continue metoprolol  5.  BPH: Continue Proscar and tamsulosin  6.  Glaucoma: Continue eyedrops  7.  Chronic atrial fibrillation: Currently in normal sinus rhythm. Off of Eliquis for now due to potential surgery.  Continue heparin drip.    Management plans discussed with the patient and daughter and they are in agreement.  CODE STATUS: FULL  TOTAL TIME TAKING CARE OF THIS PATIENT: 30 minutes.     POSSIBLE D/C 3-5 days, DEPENDING ON CLINICAL CONDITION.   Bettey Costa M.D on 10/04/2018 at 11:22 AM  Between 7am to 6pm - Pager - [phone] After 6pm go to [email or web address] - password [email or web address]  Office  [phone]  CC: Primary care physician; Jearld Fenton, NP  Note: This dictation was prepared with Dragon dictation along with smaller phrase technology. Any transcriptional errors that result from this process are unintentional.

## 2018-10-04 NOTE — Progress Notes (Signed)
Inpatient Diabetes Program Recommendations  AACE/ADA: New Consensus Statement on Inpatient Glycemic Control   Target Ranges:  Prepandial:   less than 140 mg/dL      Peak postprandial:   less than 180 mg/dL (1-2 hours)      Critically ill patients:  140 - 180 mg/dL   Results for KRIS, NO" (MRN [id number]) as of 10/04/2018 09:06  Ref. Range 10/03/2018 22:02 10/04/2018 07:54  Glucose-Capillary Latest Ref Range: 70 - 99 mg/dL 154 (H) 72  Results for GEE, HABIG" (MRN [id number]) as of 10/04/2018 09:06  Ref. Range 10/03/2018 19:18  Glucose Latest Ref Range: 70 - 99 mg/dL 57 (L)   Review of Glycemic Control  Diabetes history: DM2 Outpatient Diabetes medications: Levemir 42 units QHS, Humalog 18 units with breakfast, Humalog 3 units with lunch, Humalog 10 units with supper Current orders for Inpatient glycemic control: Levemir 42 units QHS, Novolog 18 units with breakfast, Novolog 20 units with lunch, Novolog 16 units with supper; Prednisone 10 mg QAM  Inpatient Diabetes Program Recommendations:   Insulin - Basal: Please consider decreasing Levemir to 30 units QHS (based on 84.8 kg x 0.35 units).  Correction (SSI): Please consider ordering Novolog 0-9 units TID with meals and Novolog 0-5 units QHS.  Insulin - Meal Coverage: Please discontinue Novolog 18 units with breakfast, 20 units with lunch, and 16 units with supper and order Novolog 4 units TID with meals for meal coverage if patient eats at least 50% of meals.  Thanks, Barnie Alderman, RN, MSN, CDE Diabetes Coordinator Inpatient Diabetes Program [phone] (Team Pager from 8am to 5pm)

## 2018-10-04 NOTE — Consult Note (Signed)
[facility] Podiatry                                                      Patient Demographics  [name], is a 85 y.o. male   MRN: [id number]   DOB - March 03, 1932  Admit Date - 10/03/2018    Outpatient Primary MD for the patient is Jearld Fenton, NP  Consult requested in the Hospital by Bettey Costa, MD, On 10/04/2018    Reason for consult failed first ray amputation left foot with incision necrosis   With History of -  Past Medical History:  Diagnosis Date  . Arthritis   . CAD    a. MI 01/29/1996 tx'd w/ TPA @ [facility]; b. Myoview 06/2005: EF 50%, scar @ apex, mild peri-infarct ischemia  . Cancer ([place])    skin  . Chronic atrial fibrillation    a. since 2006; b. on warfarin  . Chronic diastolic CHF (congestive heart failure) ([place])    a. echo 04/2006: EF lower limits of nl, mod LVH, mild aortic root dilatation, & mild MR, biatrial enlargement; b. echo 04/2013: EF 60%, mod dilated LA, mild MR & TR, mod pulm HTN w/ RV systolic pressure 53, c. echo 04/21/14: EF 55-60%, unable to exclude WMA, severely dilated LA 6.6 cm, nl RVSP, mildly dilated aortic root  . COPD (chronic obstructive pulmonary disease) (H[facility])    oxygen prn at home  . CVA [phone]   x2  . DM   . Falls   . GERD (gastroesophageal reflux disease)   . History of kidney stones   . HYPERLIPIDEMIA   . HYPERTENSION   . Kidney stone    a. s/p left ureteral stenting 04/24/14  . Left arm weakness    limited movement. S/P fall injury  . Neuropathy of both feet   . Poor balance   . Wears dentures    full upper and lower      Past Surgical History:  Procedure Laterality Date  . AMPUTATION TOE Left 06/13/2018   Procedure: 1st Ray Resection Left;  Surgeon: Sharlotte Alamo, DPM;  Location: [facility];  Service: Podiatry;  Laterality: Left;  . BLADDER SURGERY     stent placement   . CARDIAC CATHETERIZATION   1997   [facility]  . CAROTID STENT INSERTION  1997  . CATARACT EXTRACTION W/PHACO Left 10/11/2017   Procedure: CATARACT EXTRACTION PHACO AND INTRAOCULAR LENS PLACEMENT ([place]) COMPLICATED LEFT DIABETIC;  Surgeon: Leandrew Koyanagi, MD;  Location: [facility];  Service: Ophthalmology;  Laterality: Left;  MALYUGIN Diabetic - insulin  . CATARACT EXTRACTION W/PHACO Right 11/30/2017   Procedure: CATARACT EXTRACTION PHACO AND INTRAOCULAR LENS PLACEMENT ([place]) COMPLICATED  RIGHT DIABETIC;  Surgeon: Leandrew Koyanagi, MD;  Location: [facility];  Service: Ophthalmology;  Laterality: Right;  Diabetic - insulin  . CIRCUMCISION  2016  . CORONARY ANGIOPLASTY  1997   s/p stent placement x 2   . CYSTOSCOPY W/ URETERAL STENT REMOVAL Left 10/09/2014   Procedure: CYSTOSCOPY WITH STENT REMOVAL;  Surgeon: Hollice Espy, MD;  Location: [facility];  Service: Urology;  Laterality: Left;  . CYSTOSCOPY WITH STENT PLACEMENT Left 10/09/2014   Procedure: CYSTOSCOPY WITH STENT PLACEMENT;  Surgeon: Hollice Espy, MD;  Location: [facility];  Service: Urology;  Laterality: Left;  . IRRIGATION AND DEBRIDEMENT FOOT Left 09/15/2018   Procedure:  IRRIGATION AND DEBRIDEMENT FOOT DELAY CLOSURE;  Surgeon: Samara Deist, DPM;  Location: [facility];  Service: Podiatry;  Laterality: Left;  . KIDNEY SURGERY  05/2013   s/p stent placement   . LOWER EXTREMITY ANGIOGRAPHY Left 06/12/2018   Procedure: Lower Extremity Angiography;  Surgeon: Algernon Huxley, MD;  Location: [facility];  Service: Cardiovascular;  Laterality: Left;  . LOWER EXTREMITY ANGIOGRAPHY Left 09/13/2018   Procedure: Lower Extremity Angiography;  Surgeon: Algernon Huxley, MD;  Location: [facility] [facility];  Service: Cardiovascular;  Laterality: Left;  . stents ureters Bilateral   . TONSILLECTOMY AND ADENOIDECTOMY  1959  . URETEROSCOPY WITH HOLMIUM LASER LITHOTRIPSY Left 10/09/2014   Procedure: URETEROSCOPY WITH HOLMIUM LASER LITHOTRIPSY;  Surgeon: Hollice Espy,  MD;  Location: [facility];  Service: Urology;  Laterality: Left;    in for   No chief complaint on file.    HPI  [name]  is a 85 y.o. male, patient underwent a left foot first ray amputation about 2 to 3 weeks ago unfortunately the wound has not healed and has become actually significantly necrotic on the region.  History of peripheral arterial disease.    Review of Systems he is alert well oriented no apparent distress at this point other than some pain but he states is doing better since his been in the hospital.  Social History Social History   Tobacco Use  . Smoking status: Former Smoker    Packs/day: 2.00    Years: 40.00    Pack years: 80.00    Types: Cigarettes    Quit date: 05/24/1990    Years since quitting: 28.3  . Smokeless tobacco: Former Systems developer    Quit date: 05/24/1990  Substance Use Topics  . Alcohol use: No    Family History Family History  Problem Relation Age of Onset  . Heart disease Mother   . Diabetes Mother   . Heart disease Maternal Grandmother   . Diabetes Maternal Grandmother   . Cancer Neg Hx   . Stroke Neg Hx     Prior to Admission medications   Medication Sig Start Date End Date Taking? Authorizing Provider  torsemide (DEMADEX) 20 MG tablet Take 40 mg by mouth 2 (two) times daily.   Yes [provider]  acetaminophen (TYLENOL) 650 MG CR tablet Take 650 mg by mouth every 8 (eight) hours as needed for pain.     [provider]  albuterol (PROAIR HFA) 108 (90 Base) MCG/ACT inhaler USE 1 TO 2 INHALATIONS EVERY 4 HOURS AS NEEDED Patient taking differently: Inhale 1-2 puffs into the lungs every 4 (four) hours as needed for wheezing or shortness of breath.  11/09/17   Wilhelmina Mcardle, MD  ALPRAZolam Duanne Moron) 0.25 MG tablet Take 1 tablet (0.25 mg total) by mouth at bedtime as needed for anxiety. 07/19/18   Jearld Fenton, NP  apixaban (ELIQUIS) 2.5 MG TABS tablet Take 1 tablet (2.5 mg total) by mouth 2 (two) times daily. 03/27/18    Minna Merritts, MD  atorvastatin (LIPITOR) 40 MG tablet TAKE 1 TABLET DAILY Patient taking differently: Take 40 mg by mouth daily at 6 PM.  07/17/18   Jearld Fenton, NP  citalopram (CELEXA) 10 MG tablet TAKE 1 TABLET DAILY 09/28/18   Jearld Fenton, NP  clotrimazole (LOTRIMIN) 1 % cream Apply 1 application topically 2 (two) times daily. 11/18/17   Bedsole, Amy E, MD  COLCRYS 0.6 MG tablet TAKE 1 TABLET TWICE A DAY  AS NEEDED (ACUTE GOUT FLARE) USE AS DIRECTED 07/06/18   Baity, Coralie Keens, NP  COMBIGAN 0.2-0.5 % ophthalmic solution Place 1 drop into both eyes daily. 06/26/18   [provider]  diclofenac sodium (VOLTAREN) 1 % GEL Apply 4 g topically 4 (four) times daily. 01/05/18   Copland, Frederico Hamman, MD  dorzolamide (TRUSOPT) 2 % ophthalmic solution Place 1 drop into both eyes 3 (three) times daily.  03/31/16   [provider]  ELIQUIS 5 MG TABS tablet TAKE 1 TABLET TWICE A DAY 09/18/18   Minna Merritts, MD  ezetimibe (ZETIA) 10 MG tablet TAKE 1 TABLET DAILY 07/17/18   Jearld Fenton, NP  finasteride (PROSCAR) 5 MG tablet TAKE 1 TABLET DAILY Patient taking differently: Take 5 mg by mouth daily.  06/29/17   Jearld Fenton, NP  gabapentin (NEURONTIN) 100 MG capsule TAKE 1 CAPSULE THREE TIMES A DAY Patient taking differently: Take 100 mg by mouth at bedtime.  06/09/18   Jearld Fenton, NP  insulin detemir (LEVEMIR) 100 UNIT/ML injection Inject 0.28 mLs (28 Units total) into the skin daily at 10 pm. Patient taking differently: Inject 42 Units into the skin daily at 10 pm.  06/15/18   Fritzi Mandes, MD  insulin lispro (HUMALOG) 100 UNIT/ML injection Inject 0.04 mLs (4 Units total) into the skin 3 (three) times daily with meals. Patient taking differently: Inject 10-18 Units into the skin 3 (three) times daily with meals. 18 am, 3 lunch, 10 supper 06/15/18   Fritzi Mandes, MD  Insulin Pen Needle (BD PEN NEEDLE NANO U/F) 32G X 4 MM MISC USE THREE TIMES A DAY FOR INSULIN ADMINISTRATION 02/15/18    Jearld Fenton, NP  Insulin Syringe-Needle U-100 30G X 1/2" 1 ML MISC 1 each by Does not apply route 3 (three) times daily. 07/19/14   Jearld Fenton, NP  latanoprost (XALATAN) 0.005 % ophthalmic solution Place 1 drop into both eyes at bedtime.  04/18/16   [provider]  levothyroxine (SYNTHROID) 50 MCG tablet TAKE 1 TABLET DAILY BEFORE BREAKFAST 09/25/18   Jearld Fenton, NP  metolazone (ZAROXOLYN) 5 MG tablet Take 1 tablet (5 mg total) by mouth daily as needed (swelling). 03/13/18   Minna Merritts, MD  metoprolol succinate (TOPROL-XL) 50 MG 24 hr tablet TAKE 1 TABLET TWICE A DAY 06/09/18   Jearld Fenton, NP  montelukast (SINGULAIR) 10 MG tablet TAKE 1 TABLET AT BEDTIME Patient taking differently: Take 10 mg by mouth at bedtime.  02/24/18   Jearld Fenton, NP  nitroGLYCERIN (NITROSTAT) 0.4 MG SL tablet Place 0.4 mg under the tongue every 5 (five) minutes as needed.      [provider]  nystatin-triamcinolone (MYCOLOG II) cream Apply 1 application topically 2 (two) times daily.  03/12/14   [provider]  potassium chloride SA (K-DUR,KLOR-CON) 20 MEQ tablet Take 4 tablets (80 meq) by mouth twice daily, take an extra 1 tablet (20 meq) on the days you take metolazone Patient taking differently: Take 80 mEq by mouth 2 (two) times daily. Take 4 tablets (80 meq) by mouth twice daily at breakfast and at lunch, take an extra 1 tablet (20 meq) on the days you take metolazone put tablet in applesauce to aid swallowing 02/10/18   Minna Merritts, MD  predniSONE (DELTASONE) 10 MG tablet Take 1 tablet (10 mg total) by mouth daily with breakfast. 05/05/18   Jearld Fenton, NP  SYMBICORT 160-4.5 MCG/ACT inhaler USE 2 INHALATIONS TWICE  A DAY Patient taking differently: Inhale 1 puff into the lungs 2 (two) times daily.  08/31/17   Wilhelmina Mcardle, MD  tamsulosin (FLOMAX) 0.4 MG CAPS capsule TAKE 1 CAPSULE DAILY 09/28/18   Jearld Fenton, NP  traMADol (ULTRAM) 50 MG tablet Take 1  tablet (50 mg total) by mouth 2 (two) times daily. 06/29/18   Jearld Fenton, NP  traZODone (DESYREL) 50 MG tablet Take 2 tablets (100 mg total) by mouth at bedtime as needed for sleep. 02/16/18   Jearld Fenton, NP  umeclidinium bromide (INCRUSE ELLIPTA) 62.5 MCG/INH AEPB Inhale 1 puff into the lungs daily.    [provider]    Anti-infectives (From admission, onward)   Start     Dose/Rate Route Frequency Ordered Stop   10/05/18 2200  vancomycin (VANCOCIN) 1,250 mg in sodium chloride 0.9 % 250 mL IVPB     1,250 mg 166.7 mL/hr over 90 Minutes Intravenous Every 48 hours 10/03/18 2009     10/03/18 2000  vancomycin (VANCOCIN) 2,000 mg in sodium chloride 0.9 % 500 mL IVPB     2,000 mg 250 mL/hr over 120 Minutes Intravenous  Once 10/03/18 1942 10/04/18 0214   10/03/18 2000  ceFEPIme (MAXIPIME) 2 g in sodium chloride 0.9 % 100 mL IVPB     2 g 200 mL/hr over 30 Minutes Intravenous Every 12 hours 10/03/18 1942        Scheduled Meds: . ALPRAZolam  0.25 mg Oral QHS  . atorvastatin  40 mg Oral q1800  . brimonidine  1 drop Both Eyes Daily   And  . timolol  1 drop Both Eyes Daily  . citalopram  10 mg Oral Daily  . diclofenac sodium  4 g Topical QID  . dorzolamide  1 drop Both Eyes TID  . ezetimibe  10 mg Oral Daily  . finasteride  5 mg Oral Daily  . gabapentin  100 mg Oral QHS  . insulin aspart  16 Units Subcutaneous Q supper  . insulin aspart  18 Units Subcutaneous QAC breakfast  . insulin aspart  20 Units Subcutaneous QAC lunch  . insulin detemir  42 Units Subcutaneous Q2200  . latanoprost  1 drop Both Eyes QHS  . levothyroxine  50 mcg Oral Q0600  . metoprolol succinate  50 mg Oral BID  . mometasone-formoterol  2 puff Inhalation BID  . montelukast  10 mg Oral QHS  . potassium chloride  80 mEq Oral BID WC  . predniSONE  10 mg Oral Q breakfast  . tamsulosin  0.4 mg Oral Daily  . torsemide  40 mg Oral BID  . traMADol  50 mg Oral BID  . umeclidinium bromide  1 puff Inhalation  Daily   Continuous Infusions: . ceFEPime (MAXIPIME) IV 2 g (10/03/18 2218)  . heparin 1,100 Units/hr (10/03/18 2321)  . [START ON 10/05/2018] vancomycin     PRN Meds:.acetaminophen **OR** acetaminophen, albuterol, colchicine, HYDROcodone-acetaminophen, nitroGLYCERIN, ondansetron **OR** ondansetron (ZOFRAN) IV, traZODone  Allergies  Allergen Reactions  . Contrast Media [Iodinated Diagnostic Agents] Shortness Of Breath  . Morphine And Related Other (See Comments)    Hallucinations   . Niacin And Related Dermatitis  . Other     Other reaction(s): SHORTNESS OF BREATH  . Amlodipine Rash    Physical Exam  Vitals  Blood pressure (!) 118/54, pulse (!) 108, temperature (!) 97.5 F (36.4 C), temperature source Oral, resp. rate 17, height 5\' 6"  (1.676 m), weight 84.8 kg, SpO2 95 %.  Lower Extremity exam:  Vascular: Nonpalpable left foot  Dermatological: Patient has erythema to the lower leg and foot throughout the left foot and lower leg.  At the previous first ray amputation site there is necrosis along the incision margin with a complete lack of healing.  Some drainage.  Not appear to be grossly infected.  Couple smaller ulcers on the dorsum of the foot as well.  Neurological: Peripheral neuropathy  Ortho: Failed left first ray amputation site.  Data Review  CBC Recent Labs  Lab 10/03/18 1918 10/04/18 0347  WBC 13.9* 9.9  HGB 11.6* 10.7*  HCT 37.3* 35.1*  PLT 313 265  MCV 91.6 93.6  MCH 28.5 28.5  MCHC 31.1 30.5  RDW 17.1* 17.2*   ------------------------------------------------------------------------------------------------------------------  Chemistries  Recent Labs  Lab 10/03/18 1918 10/04/18 0347  NA 138 137  K 5.0 4.3  CL 107 106  CO2 23 23  GLUCOSE 57* 178*  BUN 38* 33*  CREATININE 1.68* 1.44*  CALCIUM 8.9 8.4*   ------------------------------------------------------------------------------------------- Urinalysis    Component Value Date/Time    COLORURINE YELLOW (A) 09/10/2018 1112   APPEARANCEUR HAZY (A) 09/10/2018 1112   APPEARANCEUR Cloudy (A) 09/20/2014 1440   LABSPEC 1.009 09/10/2018 1112   LABSPEC 1.010 08/05/2014 2030   PHURINE 5.0 09/10/2018 1112   GLUCOSEU NEGATIVE 09/10/2018 1112   GLUCOSEU 50 mg/dL 08/05/2014 2030   GLUCOSEU NEGATIVE 02/14/2014 1257   HGBUR NEGATIVE 09/10/2018 1112   [place] 09/10/2018 1112   BILIRUBINUR neg 08/22/2018 1001   BILIRUBINUR Negative 09/20/2014 1440   BILIRUBINUR Negative 08/05/2014 2030   [place] 09/10/2018 1112   PROTEINUR NEGATIVE 09/10/2018 1112   UROBILINOGEN 0.2 08/22/2018 1001   UROBILINOGEN 0.2 02/14/2014 1257   NITRITE NEGATIVE 09/10/2018 1112   LEUKOCYTESUR LARGE (A) 09/10/2018 1112   LEUKOCYTESUR 3+ 08/05/2014 2030     Imaging results:   No results found.  Assessment & Plan: I talked with the patient and his daughter today and explained that I have concerns about his ability to heal if our to try a transmetatarsal amputation.  Think there is fairly high risk of lack of healing with that particular procedure based on the recent history of nonhealing to the first ray amputation as well as with the rubor to his left foot and leg.  Dr. dew is scheduled to see him today for a second opinion on healing not sure if they will do any more vascular work there is did someone a week or 2 ago.  He may be more predictably healed with a below-knee amputation which would also alleviate his pain and give him more predictable chance for timely healing to the region.  Active Problems:   Diabetic foot infection [facility])   Family Communication: Plan discussed with patient and family  Albertine Patricia M.D on 10/04/2018 at 8:20 AM  Thank you for the consult, we will follow the patient with you in the Hospital.

## 2018-10-04 NOTE — Consult Note (Signed)
[facility] Vascular Consult Note  MRN : [id number]  [name]. is a 85 y.o. (1931/08/30) male who presents with chief complaint of left foot wound.  History of Present Illness:  The patient is an 85 year old male with a past medical history of hypertension, hyperlipidemia, GERD, diabetes, CVA (1999, 2016), COPD, congestive heart failure, chronic atrial fibrillation, lung cancer, coronary artery disease, arthritis, peripheral artery disease with a chronic left foot wound.  The patient is well-known to our service as he has undergone endovascular intervention in the past. On 09/13/2018, the patient underwent a: 1. Ultrasound guidance for vascular access right femoral artery 2. Catheter placement into left common femoral artery from right femoral approach 3. Aortogram and selective left lower extremity angiogram 4. Percutaneous transluminal angioplasty of left posterior tibial artery with 2.5 mm diameter angioplasty balloon 5.  Percutaneous transluminal angioplasty of the left SFA and proximal popliteal artery with 5 mm diameter by 30 cm length Lutonix drug-coated angioplasty balloon             6.  Via bond stent placement to the left SFA and proximal popliteal artery for multiple areas of greater than 50% stenosis after angioplasty using a 6 mm diameter by 25 cm length stent 7. StarClose closure device right femoral artery  On 09/15/18, the patient underwent a delayed primary closure left foot and excision first metatarsal left foot with podiatry.  The patient presented to the [facility] emergency department yesterday evening after being seen by his visiting home nurse who noted his incision had "opened and there was "necrotic tissue" surrounding the wound.  The patient denies any worsening pain to the extremity however there has been a progressively worsening foul  odor.  The patient denies any fever, nausea vomiting.  He denies any shortness of breath or chest pain.  Vascular surgery was consulted by Dr. Earleen Newport for possible amputation. Current Facility-Administered Medications  Medication Dose Route Frequency Provider Last Rate Last Dose  . acetaminophen (TYLENOL) tablet 650 mg  650 mg Oral Q6H PRN Loletha Grayer, MD       Or  . acetaminophen (TYLENOL) suppository 650 mg  650 mg Rectal Q6H PRN Wieting, Richard, MD      . albuterol (PROVENTIL) (2.5 MG/3ML) 0.083% nebulizer solution 2.5 mg  2.5 mg Inhalation Q4H PRN Wieting, Richard, MD      . ALPRAZolam Duanne Moron) tablet 0.25 mg  0.25 mg Oral QHS Loletha Grayer, MD   0.25 mg at 10/03/18 2204  . atorvastatin (LIPITOR) tablet 40 mg  40 mg Oral q1800 Loletha Grayer, MD   40 mg at 10/03/18 2230  . brimonidine (ALPHAGAN) 0.2 % ophthalmic solution 1 drop  1 drop Both Eyes Daily Loletha Grayer, MD   1 drop at 10/04/18 0941   And  . timolol (TIMOPTIC) 0.5 % ophthalmic solution 1 drop  1 drop Both Eyes Daily Loletha Grayer, MD   1 drop at 10/04/18 0942  . ceFEPIme (MAXIPIME) 2 g in sodium chloride 0.9 % 100 mL IVPB  2 g Intravenous Q12H Kristeen Miss R, RPH 200 mL/hr at 10/04/18 1039 2 g at 10/04/18 1039  . citalopram (CELEXA) tablet 10 mg  10 mg Oral Daily Loletha Grayer, MD   10 mg at 10/04/18 8469  . colchicine tablet 0.6 mg  0.6 mg Oral Daily PRN Loletha Grayer, MD      . diclofenac sodium (VOLTAREN) 1 % transdermal gel 4 g  4 g Topical QID Wieting, Richard,  MD   4 g at 10/03/18 2210  . dorzolamide (TRUSOPT) 2 % ophthalmic solution 1 drop  1 drop Both Eyes TID Loletha Grayer, MD   1 drop at 10/04/18 1003  . ezetimibe (ZETIA) tablet 10 mg  10 mg Oral Daily Loletha Grayer, MD   10 mg at 10/04/18 0924  . finasteride (PROSCAR) tablet 5 mg  5 mg Oral Daily Loletha Grayer, MD   5 mg at 10/04/18 0924  . gabapentin (NEURONTIN) capsule 100 mg  100 mg Oral QHS Loletha Grayer, MD   100 mg at 10/03/18  2203  . heparin ADULT infusion 100 units/mL (25000 units/225mL sodium chloride 0.45%)  1,250 Units/hr Intravenous Continuous Rocky Morel, RPH 12.5 mL/hr at 10/04/18 1034 1,250 Units/hr at 10/04/18 1034  . HYDROcodone-acetaminophen (NORCO/VICODIN) 5-325 MG per tablet 1 tablet  1 tablet Oral Q6H PRN Loletha Grayer, MD   1 tablet at 10/04/18 0936  . insulin aspart (novoLOG) injection 0-5 Units  0-5 Units Subcutaneous QHS Mody, Sital, MD      . insulin aspart (novoLOG) injection 0-9 Units  0-9 Units Subcutaneous TID WC Mody, Sital, MD      . insulin aspart (novoLOG) injection 4 Units  4 Units Subcutaneous TID WC Mody, Sital, MD      . insulin detemir (LEVEMIR) injection 30 Units  30 Units Subcutaneous Q2200 Mody, Sital, MD      . latanoprost (XALATAN) 0.005 % ophthalmic solution 1 drop  1 drop Both Eyes QHS Loletha Grayer, MD   1 drop at 10/03/18 2211  . levothyroxine (SYNTHROID) tablet 50 mcg  50 mcg Oral Q0600 Loletha Grayer, MD   50 mcg at 10/04/18 0539  . metoprolol succinate (TOPROL-XL) 24 hr tablet 50 mg  50 mg Oral BID Loletha Grayer, MD   50 mg at 10/04/18 0930  . mometasone-formoterol (DULERA) 200-5 MCG/ACT inhaler 2 puff  2 puff Inhalation BID Loletha Grayer, MD   2 puff at 10/04/18 0940  . montelukast (SINGULAIR) tablet 10 mg  10 mg Oral QHS Loletha Grayer, MD   10 mg at 10/03/18 2204  . nitroGLYCERIN (NITROSTAT) SL tablet 0.4 mg  0.4 mg Sublingual Q5 min PRN Loletha Grayer, MD      . ondansetron [facility]) tablet 4 mg  4 mg Oral Q6H PRN Wieting, Richard, MD       Or  . ondansetron (ZOFRAN) injection 4 mg  4 mg Intravenous Q6H PRN Wieting, Richard, MD      . potassium chloride SA (K-DUR) CR tablet 80 mEq  80 mEq Oral BID WC Loletha Grayer, MD   80 mEq at 10/04/18 0927  . predniSONE (DELTASONE) tablet 10 mg  10 mg Oral Q breakfast Loletha Grayer, MD   10 mg at 10/04/18 2841  . tamsulosin (FLOMAX) capsule 0.4 mg  0.4 mg Oral Daily Loletha Grayer, MD   0.4 mg at 10/04/18  0929  . torsemide (DEMADEX) tablet 40 mg  40 mg Oral BID Loletha Grayer, MD   40 mg at 10/04/18 0929  . traMADol (ULTRAM) tablet 50 mg  50 mg Oral BID Loletha Grayer, MD   50 mg at 10/03/18 2204  . traZODone (DESYREL) tablet 100 mg  100 mg Oral QHS PRN Wieting, Richard, MD      . umeclidinium bromide (INCRUSE ELLIPTA) 62.5 MCG/INH 1 puff  1 puff Inhalation Daily Loletha Grayer, MD   1 puff at 10/04/18 0941  . [START ON 10/05/2018] vancomycin (VANCOCIN) IVPB 750 mg/150 ml premix  750 mg  Intravenous Q24H Rocky Morel, [facility]       Past Medical History:  Diagnosis Date  . Arthritis   . CAD    a. MI 01/29/1996 tx'd w/ TPA @ [facility]; b. Myoview 06/2005: EF 50%, scar @ apex, mild peri-infarct ischemia  . Cancer ([place])    skin  . Chronic atrial fibrillation    a. since 2006; b. on warfarin  . Chronic diastolic CHF (congestive heart failure) ([place])    a. echo 04/2006: EF lower limits of nl, mod LVH, mild aortic root dilatation, & mild MR, biatrial enlargement; b. echo 04/2013: EF 60%, mod dilated LA, mild MR & TR, mod pulm HTN w/ RV systolic pressure 53, c. echo 04/21/14: EF 55-60%, unable to exclude WMA, severely dilated LA 6.6 cm, nl RVSP, mildly dilated aortic root  . COPD (chronic obstructive pulmonary disease) (H[facility])    oxygen prn at home  . CVA [id number]   x2  . DM   . Falls   . GERD (gastroesophageal reflux disease)   . History of kidney stones   . HYPERLIPIDEMIA   . HYPERTENSION   . Kidney stone    a. s/p left ureteral stenting 04/24/14  . Left arm weakness    limited movement. S/P fall injury  . Neuropathy of both feet   . Poor balance   . Wears dentures    full upper and lower   Past Surgical History:  Procedure Laterality Date  . AMPUTATION TOE Left 06/13/2018   Procedure: 1st Ray Resection Left;  Surgeon: Sharlotte Alamo, DPM;  Location: [facility];  Service: Podiatry;  Laterality: Left;  . BLADDER SURGERY     stent placement   . CARDIAC CATHETERIZATION  1997   [facility]  . CAROTID STENT  INSERTION  1997  . CATARACT EXTRACTION W/PHACO Left 10/11/2017   Procedure: CATARACT EXTRACTION PHACO AND INTRAOCULAR LENS PLACEMENT ([place]) COMPLICATED LEFT DIABETIC;  Surgeon: Leandrew Koyanagi, MD;  Location: [place];  Service: Ophthalmology;  Laterality: Left;  MALYUGIN Diabetic - insulin  . CATARACT EXTRACTION W/PHACO Right 11/30/2017   Procedure: CATARACT EXTRACTION PHACO AND INTRAOCULAR LENS PLACEMENT ([place]) COMPLICATED  RIGHT DIABETIC;  Surgeon: Leandrew Koyanagi, MD;  Location: [place];  Service: Ophthalmology;  Laterality: Right;  Diabetic - insulin  . CIRCUMCISION  2016  . CORONARY ANGIOPLASTY  1997   s/p stent placement x 2   . CYSTOSCOPY W/ URETERAL STENT REMOVAL Left 10/09/2014   Procedure: CYSTOSCOPY WITH STENT REMOVAL;  Surgeon: Hollice Espy, MD;  Location: [facility];  Service: Urology;  Laterality: Left;  . CYSTOSCOPY WITH STENT PLACEMENT Left 10/09/2014   Procedure: CYSTOSCOPY WITH STENT PLACEMENT;  Surgeon: Hollice Espy, MD;  Location: [facility];  Service: Urology;  Laterality: Left;  . IRRIGATION AND DEBRIDEMENT FOOT Left 09/15/2018   Procedure: IRRIGATION AND DEBRIDEMENT FOOT DELAY CLOSURE;  Surgeon: Samara Deist, DPM;  Location: [facility];  Service: Podiatry;  Laterality: Left;  . KIDNEY SURGERY  05/2013   s/p stent placement   . LOWER EXTREMITY ANGIOGRAPHY Left 06/12/2018   Procedure: Lower Extremity Angiography;  Surgeon: Algernon Huxley, MD;  Location: [place] [facility];  Service: Cardiovascular;  Laterality: Left;  . LOWER EXTREMITY ANGIOGRAPHY Left 09/13/2018   Procedure: Lower Extremity Angiography;  Surgeon: Algernon Huxley, MD;  Location: [place] [facility];  Service: Cardiovascular;  Laterality: Left;  . stents ureters Bilateral   . TONSILLECTOMY AND ADENOIDECTOMY  1959  . URETEROSCOPY WITH HOLMIUM LASER LITHOTRIPSY Left 10/09/2014  Procedure: URETEROSCOPY WITH HOLMIUM LASER LITHOTRIPSY;  Surgeon: Hollice Espy, MD;  Location: [facility];   Service: Urology;  Laterality: Left;   Social History Social History   Tobacco Use  . Smoking status: Former Smoker    Packs/day: 2.00    Years: 40.00    Pack years: 80.00    Types: Cigarettes    Quit date: 05/24/1990    Years since quitting: 28.3  . Smokeless tobacco: Former Systems developer    Quit date: 05/24/1990  Substance Use Topics  . Alcohol use: No  . Drug use: No   Family History Family History  Problem Relation Age of Onset  . Heart disease Mother   . Diabetes Mother   . Heart disease Maternal Grandmother   . Diabetes Maternal Grandmother   . Cancer Neg Hx   . Stroke Neg Hx   Denies family history of peripheral artery disease, venous disease and/or bleeding/clotting disorders.  Allergies  Allergen Reactions  . Contrast Media [Iodinated Diagnostic Agents] Shortness Of Breath  . Morphine And Related Other (See Comments)    Hallucinations   . Niacin And Related Dermatitis  . Other     Other reaction(s): SHORTNESS OF BREATH  . Amlodipine Rash   REVIEW OF SYSTEMS (Negative unless checked)  Constitutional: [] Weight loss  [] Fever  [] Chills Cardiac: [] Chest pain   [] Chest pressure   [] Palpitations   [] Shortness of breath when laying flat   [] Shortness of breath at rest   [] Shortness of breath with exertion. Vascular:  [] Pain in legs with walking   [x] Pain in legs at rest   [x] Pain in legs when laying flat   [] Claudication   [] Pain in feet when walking  [x] Pain in feet at rest  [x] Pain in feet when laying flat   [] History of DVT   [] Phlebitis   [] Swelling in legs   [] Varicose veins   [x] Non-healing ulcers Pulmonary:   [] Uses home oxygen   [] Productive cough   [] Hemoptysis   [] Wheeze  [] COPD   [] Asthma Neurologic:  [] Dizziness  [] Blackouts   [] Seizures   [] History of stroke   [] History of TIA  [] Aphasia   [] Temporary blindness   [] Dysphagia   [] Weakness or numbness in arms   [] Weakness or numbness in legs Musculoskeletal:  [] Arthritis   [] Joint swelling   [] Joint pain   [] Low back  pain Hematologic:  [] Easy bruising  [] Easy bleeding   [] Hypercoagulable state   [] Anemic  [] Hepatitis Gastrointestinal:  [] Blood in stool   [] Vomiting blood  [] Gastroesophageal reflux/heartburn   [] Difficulty swallowing. Genitourinary:  [] Chronic kidney disease   [] Difficult urination  [] Frequent urination  [] Burning with urination   [] Blood in urine Skin:  [] Rashes   [x] Ulcers   [x] Wounds Psychological:  [] History of anxiety   []  History of major depression.  Physical Examination  Vitals:   10/03/18 2015 10/04/18 0536 10/04/18 0739 10/04/18 0929  BP: 117/60 (!) 94/58 (!) 118/54 125/62  Pulse: 77 95 (!) 108 (!) 105  Resp: 19 16 17    Temp: 97.7 F (36.5 C) 98 F (36.7 C) (!) 97.5 F (36.4 C)   TempSrc: Oral Oral Oral   SpO2: 99% 96% 95%   Weight:      Height:       Body mass index is 30.17 kg/m. Gen:  WD/WN, NAD Head: [place]/AT, No temporalis wasting. Prominent temp pulse not noted. Ear/Nose/Throat: Hearing grossly intact, nares w/o erythema or drainage, oropharynx w/o Erythema/Exudate Eyes: Sclera non-icteric, conjunctiva clear Neck: Trachea midline.  No JVD.  Pulmonary:  Good air movement, respirations not labored, equal bilaterally.  Cardiac: RRR, normal S1, S2. Vascular:  Vessel Right Left  Radial Palpable Palpable  Ulnar Palpable Palpable  Brachial Palpable Palpable  Carotid Palpable, without bruit Palpable, without bruit  Aorta Not palpable N/A  Femoral Palpable Palpable  Popliteal Palpable Palpable  PT Non-Palpable Non-Palpable  DP Non-Palpable Non-Palpable   Left Lower Extremity: Thigh soft, calf soft. First ray amputation site with dehiscence and necrosis. Heel ulceration also noted. Small scattered ulcers noted to the dorsum of the foot.  Unable to palpate pedal pulses.  Erythema is noted from the calf distally.  Gastrointestinal: soft, non-tender/non-distended. No guarding/reflex.  Musculoskeletal: M/S 5/5 throughout.   Neurologic: Sensation grossly intact in  extremities.  Symmetrical.  Speech is fluent. Motor exam as listed above. Psychiatric: Judgment intact, Mood & affect appropriate for pt's clinical situation. Dermatologic: As above Lymph : No Cervical, Axillary, or Inguinal lymphadenopathy.  CBC Lab Results  Component Value Date   WBC 9.9 10/04/2018   HGB 10.7 (L) 10/04/2018   HCT 35.1 (L) 10/04/2018   MCV 93.6 10/04/2018   PLT 265 10/04/2018   BMET    Component Value Date/Time   NA 137 10/04/2018 0347   NA 143 02/21/2015 1108   NA 137 08/05/2014 1839   K 4.3 10/04/2018 0347   K 3.6 08/05/2014 1839   CL 106 10/04/2018 0347   CL 106 08/05/2014 1839   CO2 23 10/04/2018 0347   CO2 23 08/05/2014 1839   GLUCOSE 178 (H) 10/04/2018 0347   GLUCOSE 235 (H) 08/05/2014 1839   BUN 33 (H) 10/04/2018 0347   BUN 26 02/21/2015 1108   BUN 19 08/05/2014 1839   CREATININE 1.44 (H) 10/04/2018 0347   CREATININE 1.20 08/05/2014 1839   CALCIUM 8.4 (L) 10/04/2018 0347   CALCIUM 8.6 (L) 08/05/2014 1839   GFRNONAA 44 (L) 10/04/2018 0347   GFRNONAA 57 (L) 08/05/2014 1839   GFRAA 51 (L) 10/04/2018 0347   GFRAA >60 08/05/2014 1839   Estimated Creatinine Clearance: 39 mL/min (A) (by C-G formula based on SCr of 1.44 mg/dL (H)).  COAG Lab Results  Component Value Date   INR 1.3 (H) 10/04/2018   INR 1.3 (H) 10/03/2018   INR 1.2 09/11/2018   Radiology Dg Chest 2 View  Result Date: 09/12/2018 CLINICAL DATA:  Chest pain EXAM: CHEST - 2 VIEW COMPARISON:  Sep 10, 2018 and April 17, 2018 FINDINGS: There is cardiomegaly with pulmonary venous hypertension. There is diffuse reticulonodular interstitial disease with small pleural effusions bilaterally. There is no frank consolidation. No adenopathy. There is aortic atherosclerosis. Bones are osteoporotic. IMPRESSION: Findings felt to be indicative of congestive heart failure, with suspected degree of underlying chronic interstitial lung disease. No consolidation. Small pleural effusions bilaterally. There  is an apparent increase in interstitial edema compared to 2 days prior. Aortic Atherosclerosis (ICD10-I70.0).  Bones osteoporotic. Electronically Signed   By: Lowella Grip III M.D.   On: 09/12/2018 08:26   Dg Ribs Unilateral W/chest Right  Result Date: 09/10/2018 CLINICAL DATA:  Right anterior lower rib pain after fall EXAM: RIGHT RIBS AND CHEST - 3+ VIEW COMPARISON:  08/24/2018 chest radiograph. FINDINGS: Stable cardiomediastinal silhouette with mild cardiomegaly. No pneumothorax. No pleural effusion. Diffuse prominence of the central interstitial markings, slightly decreased from prior, favor mild pulmonary edema. No fracture or suspicious focal osseous lesion seen in the right ribs. IMPRESSION: 1. Cardiomegaly with diffuse prominence of the central interstitial markings, favor mild pulmonary edema, slightly improved from  prior. 2. No right rib fracture detected. Should the patient's symptoms persist or worsen, repeat radiographs of the ribs in 10 - 14 days maybe of use to detect subtle nondisplaced rib fractures (which are commonly occult on initial imaging). Electronically Signed   By: Ilona Sorrel M.D.   On: 09/10/2018 12:06   Ct Head Wo Contrast  Result Date: 09/10/2018 CLINICAL DATA:  Fall EXAM: CT HEAD WITHOUT CONTRAST CT MAXILLOFACIAL WITHOUT CONTRAST CT CERVICAL SPINE WITHOUT CONTRAST TECHNIQUE: Multidetector CT imaging of the head, cervical spine, and maxillofacial structures were performed using the standard protocol without intravenous contrast. Multiplanar CT image reconstructions of the cervical spine and maxillofacial structures were also generated. COMPARISON:  CT head dated 08/11/2018 FINDINGS: Motion degraded images. CT HEAD FINDINGS Brain: No evidence of acute infarction, hemorrhage, hydrocephalus, extra-axial collection or mass lesion/mass effect. Encephalomalacic changes in the right occipital lobe, chronic. Subcortical white matter and periventricular small vessel ischemic  changes. Vascular: Intracranial atherosclerosis. Skull: Normal. Negative for fracture or focal lesion. Other: None. CT MAXILLOFACIAL FINDINGS Osseous: No evidence of maxillofacial fracture. The mandible is intact. The bilateral mandibular condyles are well-seated in the TMJs. Orbits: The bilateral orbits, including the globes and retroconal soft tissues, are within normal limits. Sinuses: The visualized paranasal sinuses are essentially clear. The mastoid air cells are unopacified. Soft tissues: Negative. CT CERVICAL SPINE FINDINGS Alignment: Straightening of the normal upper cervical lordosis. Skull base and vertebrae: No acute fracture. No primary bone lesion or focal pathologic process. Soft tissues and spinal canal: No prevertebral fluid or swelling. No visible canal hematoma. Disc levels: Mild to moderate degenerative changes of the mid/lower cervical spine. Spinal canal is patent. Upper chest: Visualized thyroid is grossly unremarkable. Other: None. IMPRESSION: Motion degraded images. No evidence of acute intracranial abnormality. 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 Small vessel ischemic changes. No evidence of maxillofacial fracture. No evidence of traumatic injury to the cervical spine. Mild to moderate degenerative changes. Electronically Signed   By: Julian Hy M.D.   On: 09/10/2018 11:52   Ct Cervical Spine Wo Contrast  Result Date: 09/10/2018 CLINICAL DATA:  Fall EXAM: CT HEAD WITHOUT CONTRAST CT MAXILLOFACIAL WITHOUT CONTRAST CT CERVICAL SPINE WITHOUT CONTRAST TECHNIQUE: Multidetector CT imaging of the head, cervical spine, and maxillofacial structures were performed using the standard protocol without intravenous contrast. Multiplanar CT image reconstructions of the cervical spine and maxillofacial structures were also generated. COMPARISON:  CT head dated 08/11/2018 FINDINGS: Motion degraded images. CT HEAD FINDINGS Brain: No evidence of acute infarction, hemorrhage, hydrocephalus,  extra-axial collection or mass lesion/mass effect. Encephalomalacic changes in the right occipital lobe, chronic. Subcortical white matter and periventricular small vessel ischemic changes. Vascular: Intracranial atherosclerosis. Skull: Normal. Negative for fracture or focal lesion. Other: None. CT MAXILLOFACIAL FINDINGS Osseous: No evidence of maxillofacial fracture. The mandible is intact. The bilateral mandibular condyles are well-seated in the TMJs. Orbits: The bilateral orbits, including the globes and retroconal soft tissues, are within normal limits. Sinuses: The visualized paranasal sinuses are essentially clear. The mastoid air cells are unopacified. Soft tissues: Negative. CT CERVICAL SPINE FINDINGS Alignment: Straightening of the normal upper cervical lordosis. Skull base and vertebrae: No acute fracture. No primary bone lesion or focal pathologic process. Soft tissues and spinal canal: No prevertebral fluid or swelling. No visible canal hematoma. Disc levels: Mild to moderate degenerative changes of the mid/lower cervical spine. Spinal canal is patent. Upper chest: Visualized thyroid is grossly unremarkable. Other: None. IMPRESSION: Motion degraded images. No evidence of acute intracranial abnormality. Old  right PCA distribution infarct. Small vessel ischemic changes. No evidence of maxillofacial fracture. No evidence of traumatic injury to the cervical spine. Mild to moderate degenerative changes. Electronically Signed   By: Julian Hy M.D.   On: 09/10/2018 11:52   Ct Maxillofacial Wo Contrast  Result Date: 09/10/2018 CLINICAL DATA:  Fall EXAM: CT HEAD WITHOUT CONTRAST CT MAXILLOFACIAL WITHOUT CONTRAST CT CERVICAL SPINE WITHOUT CONTRAST TECHNIQUE: Multidetector CT imaging of the head, cervical spine, and maxillofacial structures were performed using the standard protocol without intravenous contrast. Multiplanar CT image reconstructions of the cervical spine and maxillofacial structures were  also generated. COMPARISON:  CT head dated 08/11/2018 FINDINGS: Motion degraded images. CT HEAD FINDINGS Brain: No evidence of acute infarction, hemorrhage, hydrocephalus, extra-axial collection or mass lesion/mass effect. Encephalomalacic changes in the right occipital lobe, chronic. Subcortical white matter and periventricular small vessel ischemic changes. Vascular: Intracranial atherosclerosis. Skull: Normal. Negative for fracture or focal lesion. Other: None. CT MAXILLOFACIAL FINDINGS Osseous: No evidence of maxillofacial fracture. The mandible is intact. The bilateral mandibular condyles are well-seated in the TMJs. Orbits: The bilateral orbits, including the globes and retroconal soft tissues, are within normal limits. Sinuses: The visualized paranasal sinuses are essentially clear. The mastoid air cells are unopacified. Soft tissues: Negative. CT CERVICAL SPINE FINDINGS Alignment: Straightening of the normal upper cervical lordosis. Skull base and vertebrae: No acute fracture. No primary bone lesion or focal pathologic process. Soft tissues and spinal canal: No prevertebral fluid or swelling. No visible canal hematoma. Disc levels: Mild to moderate degenerative changes of the mid/lower cervical spine. Spinal canal is patent. Upper chest: Visualized thyroid is grossly unremarkable. Other: None. IMPRESSION: Motion degraded images. No evidence of acute intracranial abnormality. 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 Small vessel ischemic changes. No evidence of maxillofacial fracture. No evidence of traumatic injury to the cervical spine. Mild to moderate degenerative changes. Electronically Signed   By: Julian Hy M.D.   On: 09/10/2018 11:52   Assessment/Plan The patient is an 85 year old male with a past medical history of hypertension, hyperlipidemia, GERD, diabetes, CVA (1999, 2016), COPD, congestive heart failure, chronic atrial fibrillation, lung cancer, coronary artery disease, arthritis,  peripheral artery disease with a chronic left foot wound. 1.  Chronic left lower extremity wound: Patient with known history of peripheral artery disease to the left lower extremity requiring endovascular intervention.  Patient underwent amputation of the left first ray which has failed.  Patient presents with worsening/new wounds to the left foot.  Due to the patient's peripheral artery disease and location of nonviable tissue he is most likely not a candidate for a transmetatarsal amputation as he will most likely not be able to heal.  Recommend a below the knee amputation.  At this level the patient should have adequate arterial perfusion to heal.  Procedure, risks and benefits explained to the patient and his daughter who was present.  All questions answered.  The patient and his daughter both wish to proceed.  We will plan on this for Friday with Dr. Lucky Cowboy. 2. Hyperlipidemia: On statin for medical management. On Eliquis. Encouraged good control as its slows the progression of atherosclerotic disease. 3. Diabetes: Encouraged good control as its slows the progression of atherosclerotic disease.  Discussed with Dr. Mayme Genta, PA-C  10/04/2018 12:27 PM  This note was created with Dragon medical transcription system.  Any error is purely unintentional

## 2018-10-04 NOTE — Consult Note (Signed)
Pharmacy Antibiotic Note  [name]. is a 85 y.o. male admitted on 10/03/2018 with wound infection.  Pharmacy has been consulted for Cefepime and Vancomycin dosing. Patient recently had great toe amputated ~2 weeks ago.   BMI 30.17   Plan: 1) Cefepime 2 g IV Q12h  2) Vancomycin 2000 mg x1 dose given 6/24 at 0014. Maintenance dose ordered for 1250 mg Q48H but renal function improving and current dosing with expected trough of 8. SCr 1.68>1.44 Will change dose to: Vancomycin 750 mg IV Q 24 hrs. Goal AUC 400-550. Expected AUC: 536 SCr used: 1.44 Expected Cmin 15.1  Will need to closely monitor renal function. Will obtain SCr level with AM labs    Height: 5\' 6"  (167.6 cm) Weight: 186 lb 15.2 oz (84.8 kg) IBW/kg (Calculated) : 63.8  Temp (24hrs), Avg:97.8 F (36.6 C), Min:97.5 F (36.4 C), Max:98.1 F (36.7 C)  Recent Labs  Lab 10/03/18 1918 10/04/18 0347  WBC 13.9* 9.9  CREATININE 1.68* 1.44*    Estimated Creatinine Clearance: 39 mL/min (A) (by C-G formula based on SCr of 1.44 mg/dL (H)).    Allergies  Allergen Reactions  . Contrast Media [Iodinated Diagnostic Agents] Shortness Of Breath  . Morphine And Related Other (See Comments)    Hallucinations   . Niacin And Related Dermatitis  . Other     Other reaction(s): SHORTNESS OF BREATH  . Amlodipine Rash    Antimicrobials this admission: 6/23 Vancomycin >>  6/23 Cefepime >>  Dose adjustments this admission: N/A  Microbiology results: N/A  Thank you for allowing pharmacy to be a part of this patient's care.  Rocky Morel 10/04/2018 8:58 AM

## 2018-10-04 NOTE — Consult Note (Signed)
ANTICOAGULATION CONSULT NOTE - Follow Up Consult  Pharmacy Consult for Heparin gtt Indication: atrial fibrillation  Allergies  Allergen Reactions  . Contrast Media [Iodinated Diagnostic Agents] Shortness Of Breath  . Morphine And Related Other (See Comments)    Hallucinations   . Niacin And Related Dermatitis  . Other     Other reaction(s): SHORTNESS OF BREATH  . Amlodipine Rash    Patient Measurements: Height: 5\' 6"  (167.6 cm) Weight: 186 lb 15.2 oz (84.8 kg) IBW/kg (Calculated) : 63.8 Heparin Dosing Weight: 81.3 kg   Vital Signs: Temp: 97.7 F (36.5 C) (06/24 1731) Temp Source: Oral (06/24 1731) BP: 104/58 (06/24 1746) Pulse Rate: 61 (06/24 1731)  Labs: Recent Labs    10/03/18 1918 10/04/18 0347 10/04/18 0749 10/04/18 0800 10/04/18 1741  HGB 11.6* 10.7*  --   --   --   HCT 37.3* 35.1*  --   --   --   PLT 313 265  --   --   --   APTT 33  --  49*  --  67*  LABPROT 15.6*  --   --  15.7*  --   INR 1.3*  --   --  1.3*  --   HEPARINUNFRC 1.04*  --  0.64  --   --   CREATININE 1.68* 1.44*  --   --   --     Estimated Creatinine Clearance: 39 mL/min (A) (by C-G formula based on SCr of 1.44 mg/dL (H)).   Medications:  PTA Eliquis - last dose taken 6/23 ~ 9 am   Assessment: Patient has a PMH significant for atrial fibrillation. Patient was switched to heparin in anticipation of amputation.  If HL < 0.1, use only HLs to titrate heparin infusion. If HL > 0.1 and does not correlate with aPTT, then will use aPTT levels.  6/23: Started heparin infusion at 1100 units/hr  6/24 0749: HL 0.64, aPTT 49 (subtherapeutic, not yet correlating). Heparin bolus 1200 units IV x1 and increased heparin drip to 1250 units/hr (=12.5 ml/hr)  6/24 APTT @1814 : 67. Therapeutic x1 on the infusion rate of 1250 units/hr (=12.5 ml/hr). Moving forward, it is appropriate to use HL. No interruptions noted.    Goal of Therapy:  aPTT 66-102 seconds corresponding to Heparin level 0.3-0.7  units/ml. Monitor platelets by anticoagulation protocol: Yes   Plan:  Will continue heparin infusion at 1250 units/hr.  Check aPTT level in 8 hours. HL and CBC in AM, per protocol Continue to monitor H&H and platelets   [name] R [name] 10/04/2018,6:48 PM

## 2018-10-05 ENCOUNTER — Encounter: Payer: Self-pay | Admitting: Anesthesiology

## 2018-10-05 LAB — CBC
HCT: 35.1 % — ABNORMAL LOW (ref 39.0–52.0)
Hemoglobin: 10.8 g/dL — ABNORMAL LOW (ref 13.0–17.0)
MCH: 28.9 pg (ref 26.0–34.0)
MCHC: 30.8 g/dL (ref 30.0–36.0)
MCV: 93.9 fL (ref 80.0–100.0)
Platelets: 281 10*3/uL (ref 150–400)
RBC: 3.74 MIL/uL — ABNORMAL LOW (ref 4.22–5.81)
RDW: 17.3 % — ABNORMAL HIGH (ref 11.5–15.5)
WBC: 11.8 10*3/uL — ABNORMAL HIGH (ref 4.0–10.5)
nRBC: 0 % (ref 0.0–0.2)

## 2018-10-05 LAB — BASIC METABOLIC PANEL
Anion gap: 6 (ref 5–15)
BUN: 30 mg/dL — ABNORMAL HIGH (ref 8–23)
CO2: 26 mmol/L (ref 22–32)
Calcium: 8.8 mg/dL — ABNORMAL LOW (ref 8.9–10.3)
Chloride: 111 mmol/L (ref 98–111)
Creatinine, Ser: 1.35 mg/dL — ABNORMAL HIGH (ref 0.61–1.24)
GFR calc Af Amer: 55 mL/min — ABNORMAL LOW (ref >60)
GFR calc non Af Amer: 48 mL/min — ABNORMAL LOW (ref >60)
Glucose, Bld: 202 mg/dL — ABNORMAL HIGH (ref 70–99)
Potassium: 4.7 mmol/L (ref 3.5–5.1)
Sodium: 143 mmol/L (ref 135–145)

## 2018-10-05 LAB — GLUCOSE, CAPILLARY
Glucose-Capillary: 100 mg/dL — ABNORMAL HIGH (ref 70–99)
Glucose-Capillary: 163 mg/dL — ABNORMAL HIGH (ref 70–99)
Glucose-Capillary: 357 mg/dL — ABNORMAL HIGH (ref 70–99)
Glucose-Capillary: 248 mg/dL — ABNORMAL HIGH (ref 70–99)

## 2018-10-05 LAB — HEPARIN LEVEL (UNFRACTIONATED): Heparin Unfractionated: 0.59 IU/mL (ref 0.30–0.70)

## 2018-10-05 LAB — SURGICAL PCR SCREEN
MRSA, PCR: NEGATIVE
Staphylococcus aureus: NEGATIVE

## 2018-10-05 LAB — APTT: aPTT: 77 seconds — ABNORMAL HIGH (ref 24–36)

## 2018-10-05 MED ORDER — SODIUM CHLORIDE 0.9 % IV SOLN
INTRAVENOUS | Status: DC
  Administered 2018-10-06 (×2): via INTRAVENOUS

## 2018-10-05 MED ORDER — INSULIN ASPART 100 UNIT/ML ~~LOC~~ SOLN
6.0000 [IU] | Freq: Three times a day (TID) | SUBCUTANEOUS | Status: DC
  Administered 2018-10-05 (×2): 6 [IU] via SUBCUTANEOUS
  Filled 2018-10-05 (×2): qty 1

## 2018-10-05 MED ORDER — MUPIROCIN 2 % EX OINT
1.0000 "application " | TOPICAL_OINTMENT | Freq: Two times a day (BID) | CUTANEOUS | Status: AC
  Administered 2018-10-06 – 2018-10-10 (×9): 1 via NASAL
  Filled 2018-10-05: qty 22

## 2018-10-05 MED ORDER — CEFAZOLIN SODIUM-DEXTROSE 2-4 GM/100ML-% IV SOLN
2.0000 g | INTRAVENOUS | Status: AC
  Administered 2018-10-06: 2 g via INTRAVENOUS
  Filled 2018-10-05: qty 100

## 2018-10-05 NOTE — Consult Note (Signed)
ANTICOAGULATION CONSULT NOTE - Follow Up Consult  Pharmacy Consult for Heparin gtt Indication: atrial fibrillation  Allergies  Allergen Reactions  . Contrast Media [Iodinated Diagnostic Agents] Shortness Of Breath  . Morphine And Related Other (See Comments)    Hallucinations   . Niacin And Related Dermatitis  . Other     Other reaction(s): SHORTNESS OF BREATH  . Amlodipine Rash    Patient Measurements: Height: 5\' 6"  (167.6 cm) Weight: 186 lb 15.2 oz (84.8 kg) IBW/kg (Calculated) : 63.8 Heparin Dosing Weight: 81.3 kg   Vital Signs: Temp: 97.7 F (36.5 C) (06/24 2020) Temp Source: Oral (06/24 2020) BP: 102/60 (06/24 2020) Pulse Rate: 73 (06/24 2020)  Labs: Recent Labs    10/03/18 1918 10/04/18 0347 10/04/18 0749 10/04/18 0800 10/04/18 1741 10/05/18 0201  HGB 11.6* 10.7*  --   --   --  10.8*  HCT 37.3* 35.1*  --   --   --  35.1*  PLT 313 265  --   --   --  281  APTT 33  --  49*  --  67*  --   LABPROT 15.6*  --   --  15.7*  --   --   INR 1.3*  --   --  1.3*  --   --   HEPARINUNFRC 1.04*  --  0.64  --   --  0.59  CREATININE 1.68* 1.44*  --   --   --  1.35*    Estimated Creatinine Clearance: 41.6 mL/min (A) (by C-G formula based on SCr of 1.35 mg/dL (H)).   Medications:  PTA Eliquis - last dose taken 6/23 ~ 9 am   Assessment: Patient has a PMH significant for atrial fibrillation. Patient was switched to heparin in anticipation of amputation.  If HL < 0.1, use only HLs to titrate heparin infusion. If HL > 0.1 and does not correlate with aPTT, then will use aPTT levels.  6/23: Started heparin infusion at 1100 units/hr  6/24 0749: HL 0.64, aPTT 49 (subtherapeutic, not yet correlating). Heparin bolus 1200 units IV x1 and increased heparin drip to 1250 units/hr (=12.5 ml/hr)  6/24 APTT @1814 : 67. Therapeutic x1 on the infusion rate of 1250 units/hr (=12.5 ml/hr). Moving forward, it is appropriate to use HL. No interruptions noted.   6/25 @ 0201 aPTT: 77, HL:  0.59. Levels are both therapeutic.    Goal of Therapy:  aPTT 66-102 seconds corresponding to Heparin level 0.3-0.7 units/ml. Monitor platelets by anticoagulation protocol: Yes   Plan:  6/25 @ 0201 aPTT: 77, HL: 0.59. Levels are both therapeutic. APTT now therapeutic x2.  Will continue heparin infusion at 1250 units/hr.   HL, aPTT and CBC in AM daily, per protocol Continue to monitor H&H and platelets  Pernell Dupre, PharmD, BCPS Clinical Pharmacist 10/05/2018 3:02 AM

## 2018-10-05 NOTE — Anesthesia Preprocedure Evaluation (Addendum)
Anesthesia Evaluation  Patient identified by MRN, date of birth, ID band Patient awake    Reviewed: Allergy & Precautions, NPO status , Patient's Chart, lab work & pertinent test results, reviewed documented beta blocker date and time   Airway Mallampati: III  TM Distance: >3 FB     Dental  (+) Chipped   Pulmonary pneumonia, resolved, COPD, former smoker,           Cardiovascular hypertension, + angina + CAD, + Past MI, + Peripheral Vascular Disease and +CHF  + dysrhythmias Atrial Fibrillation      Neuro/Psych Anxiety  Neuromuscular disease CVA    GI/Hepatic GERD  Controlled,  Endo/Other  diabetes, Poorly ControlledHypothyroidism   Renal/GU Renal diseasestones     Musculoskeletal  (+) Arthritis ,   Abdominal   Peds negative pediatric ROS (+)  Hematology negative hematology ROS (+)   Anesthesia Other Findings Past Medical History: No date: Arthritis No date: CAD     Comment:  a. MI 01/29/1996 tx'd w/ TPA @ [facility]; b. Myoview 06/2005:               EF 50%, scar @ apex, mild peri-infarct ischemia No date: Cancer ([place])     Comment:  skin No date: Chronic atrial fibrillation     Comment:  a. since 2006; b. on warfarin No date: Chronic diastolic CHF (congestive heart failure) ([place])     Comment:  a. echo 04/2006: EF lower limits of nl, mod LVH, mild               aortic root dilatation, & mild MR, biatrial enlargement;               b. echo 04/2013: EF 60%, mod dilated LA, mild MR & TR, mod              pulm HTN w/ RV systolic pressure 53, c. echo 04/21/14: EF               55-60%, unable to exclude WMA, severely dilated LA 6.6               cm, nl RVSP, mildly dilated aortic root No date: COPD (chronic obstructive pulmonary disease) ([facility])     Comment:  oxygen prn at home [phone]: CVA     Comment:  x2 No date: DM No date: Falls No date: GERD (gastroesophageal reflux disease) No date: History of kidney stones No  date: HYPERLIPIDEMIA No date: HYPERTENSION No date: Kidney stone     Comment:  a. s/p left ureteral stenting 04/24/14 No date: Left arm weakness     Comment:  limited movement. S/P fall injury No date: Neuropathy of both feet No date: Poor balance No date: Wears dentures     Comment:  full upper and lower   Reproductive/Obstetrics                           Anesthesia Physical Anesthesia Plan  ASA: IV  Anesthesia Plan: General   Post-op Pain Management:    Induction: Intravenous  PONV Risk Score and Plan:   Airway Management Planned: Oral ETT  Additional Equipment:   Intra-op Plan:   Post-operative Plan:   Informed Consent: I have reviewed the patients History and Physical, chart, labs and discussed the procedure including the risks, benefits and alternatives for the proposed anesthesia with the patient or authorized representative who has indicated his/her understanding and acceptance.  Plan Discussed with: CRNA  Anesthesia Plan Comments:        Anesthesia Quick Evaluation                                  Anesthesia Evaluation  Patient identified by MRN, date of birth, ID band Patient awake    Reviewed: Allergy & Precautions, H&P , NPO status , Patient's Chart, lab work & pertinent test results  History of Anesthesia Complications Negative for: history of anesthetic complications  Airway Mallampati: III  TM Distance: >3 FB Neck ROM: Full    Dental  (+) Upper Dentures, Lower Dentures   Pulmonary COPD (intermittent O2 use at home), former smoker,    breath sounds clear to auscultation- rhonchi (-) wheezing      Cardiovascular hypertension, (-) angina+ CAD, + Past MI, + Cardiac Stents and +CHF (chronic diastolic HF)  (-) CABG + dysrhythmias Atrial Fibrillation  Rhythm:Regular Rate:Normal - Systolic murmurs and - Diastolic murmurs Chronic left foot wounds s/p revascularization to left leg on 09/13/18    Neuro/Psych neg Seizures Anxiety CVA    GI/Hepatic Neg liver ROS, GERD  Controlled,  Endo/Other  diabetes, Type 2, Insulin DependentHypothyroidism   Renal/GU Renal InsufficiencyRenal disease     Musculoskeletal  (+) Arthritis ,   Abdominal (+) + obese,   Peds  Hematology negative hematology ROS (+)   Anesthesia Other Findings Hypokalemia, with K 2.7 this AM, awaiting replacement  Past Medical History: No date: Arthritis No date: CAD     Comment:  a. MI 01/29/1996 tx'd w/ TPA @ [facility]; b. Myoview 06/2005:               EF 50%, scar @ apex, mild peri-infarct ischemia No date: Cancer ([place])     Comment:  skin No date: Chronic atrial fibrillation     Comment:  a. since 2006; b. on warfarin No date: Chronic diastolic CHF (congestive heart failure) ([place])     Comment:  a. echo 04/2006: EF lower limits of nl, mod LVH, mild               aortic root dilatation, & mild MR, biatrial enlargement;               b. echo 04/2013: EF 60%, mod dilated LA, mild MR & TR, mod              pulm HTN w/ RV systolic pressure 53, c. echo 04/21/14: EF               55-60%, unable to exclude WMA, severely dilated LA 6.6               cm, nl RVSP, mildly dilated aortic root No date: COPD (chronic obstructive pulmonary disease) ([facility])     Comment:  oxygen prn at home [phone]: CVA     Comment:  x2 No date: DM No date: Falls No date: GERD (gastroesophageal reflux disease) No date: History of kidney stones No date: HYPERLIPIDEMIA No date: HYPERTENSION No date: Kidney stone     Comment:  a. s/p left ureteral stenting 04/24/14 No date: Left arm weakness     Comment:  limited movement. S/P fall injury No date: Neuropathy of both feet No date: Poor balance No date: Wears dentures     Comment:  full upper and lower   Reproductive/Obstetrics negative OB ROS  Anesthesia Physical Anesthesia Plan  ASA: IV  Anesthesia Plan: General   Post-op Pain  Management:    Induction: Intravenous  PONV Risk Score and Plan: 1 and Treatment may vary due to age or medical condition and Propofol infusion  Airway Management Planned: Natural Airway  Additional Equipment:   Intra-op Plan:   Post-operative Plan:   Informed Consent: I have reviewed the patients History and Physical, chart, labs and discussed the procedure including the risks, benefits and alternatives for the proposed anesthesia with the patient or authorized representative who has indicated his/her understanding and acceptance.     Dental Advisory Given  Plan Discussed with: Anesthesiologist and CRNA  Anesthesia Plan Comments:      Anesthesia Quick Evaluation

## 2018-10-05 NOTE — Progress Notes (Signed)
[facility] Daily Progress Note   Subjective: Patient without complaint this a.m.  No issues overnight.  Still agreeable for left below the knee amputation tomorrow.  Objective: Vitals:   10/04/18 1746 10/04/18 2020 10/05/18 0452 10/05/18 0739  BP: (!) 104/58 102/60 103/61 94/78  Pulse:  73 (!) 57 66  Resp:  18 18   Temp:  97.7 F (36.5 C) 97.7 F (36.5 C) (!) 97.5 F (36.4 C)  TempSrc:  Oral Oral Oral  SpO2:  99% 94% 98%  Weight:      Height:        Intake/Output Summary (Last 24 hours) at 10/05/2018 1039 Last data filed at 10/05/2018 0452 Gross per 24 hour  Intake 729.66 ml  Output 300 ml  Net 429.66 ml   Physical Exam: A&Ox3, NAD CV: RRR Pulmonary: CTA Bilaterally Abdomen: Soft, Nontender, Nondistended Vascular:  Thigh soft, calf soft. First ray amputation site with dehiscence and necrosis. Heel ulceration also noted. Small scattered ulcers noted to the dorsum of the foot.  Unable to palpate pedal pulses.  Erythema is noted from the calf distally.   Laboratory: CBC    Component Value Date/Time   WBC 11.8 (H) 10/05/2018 0201   HGB 10.8 (L) 10/05/2018 0201   HGB 13.3 08/05/2014 1839   HCT 35.1 (L) 10/05/2018 0201   HCT 40.1 08/05/2014 1839   PLT 281 10/05/2018 0201   PLT 246 08/05/2014 1839   BMET    Component Value Date/Time   NA 143 10/05/2018 0201   NA 143 02/21/2015 1108   NA 137 08/05/2014 1839   K 4.7 10/05/2018 0201   K 3.6 08/05/2014 1839   CL 111 10/05/2018 0201   CL 106 08/05/2014 1839   CO2 26 10/05/2018 0201   CO2 23 08/05/2014 1839   GLUCOSE 202 (H) 10/05/2018 0201   GLUCOSE 235 (H) 08/05/2014 1839   BUN 30 (H) 10/05/2018 0201   BUN 26 02/21/2015 1108   BUN 19 08/05/2014 1839   CREATININE 1.35 (H) 10/05/2018 0201   CREATININE 1.20 08/05/2014 1839   CALCIUM 8.8 (L) 10/05/2018 0201   CALCIUM 8.6 (L) 08/05/2014 1839   GFRNONAA 48 (L) 10/05/2018 0201   GFRNONAA 57 (L) 08/05/2014 1839   GFRAA 55 (L) 10/05/2018 0201    GFRAA >60 08/05/2014 1839   Assessment/Planning: The patient is an 85 year old male with a past medical history of hypertension, hyperlipidemia, GERD, diabetes, CVA (1999, 2016), COPD, congestive heart failure, chronic atrial fibrillation, lung cancer, coronary artery disease, arthritis, peripheral artery disease with a chronic left foot wound. 1) Scheduled for [facility] tomorrow 2) Preop'd, two units on hold, heparin to stop at 8am 3) PT / OT consulted to start on Saturday 4) Social Work consulted to assist with placement to SNF on discharge 5) Nutrition consulted to maximize healing   Discussed with Dr. Ellis Parents Stegmayer PA-C 10/05/2018 10:39 AM

## 2018-10-05 NOTE — Progress Notes (Addendum)
Inpatient Diabetes Program Recommendations  AACE/ADA: New Consensus Statement on Inpatient Glycemic Control   Target Ranges:  Prepandial:   less than 140 mg/dL      Peak postprandial:   less than 180 mg/dL (1-2 hours)      Critically ill patients:  140 - 180 mg/dL   Results for BLAIRE, HODSDON" (MRN [id number]) as of 10/05/2018 07:13  Ref. Range 10/04/2018 07:54 10/04/2018 11:40 10/04/2018 17:32 10/04/2018 21:19 10/05/2018 07:38  Glucose-Capillary Latest Ref Range: 70 - 99 mg/dL 72 118 (H) 267 (H) 256 (H) 100 (H)    Review of Glycemic Control  Diabetes history: DM2 Outpatient Diabetes medications: Levemir 42 units QHS, Humalog 18 units with breakfast, Humalog 3 units with lunch, Humalog 10 units with supper Current orders for Inpatient glycemic control: Levemir 30 units QHS, Novolog 4unit TID with meals, Novolog 0-9 units TID with meals, Novolog 0-5 units QHS; Prednisone 10 mg QAM  Inpatient Diabetes Program Recommendations:   Insulin - Meal Coverage: If Prednisone is continued, please consider increasing meal coverage to Novolog 6 units TID with meals if patient eats at least 50% of meals.  Thanks, Barnie Alderman, RN, MSN, CDE Diabetes Coordinator Inpatient Diabetes Program [phone] (Team Pager from 8am to 5pm)

## 2018-10-05 NOTE — Progress Notes (Signed)
[name] at [place] NAME: [name]    MR#:  [id number]  DATE OF BIRTH:  1932-02-26  SUBJECTIVE:  Worried about blood sugars  Going to OR tomorrow Daughter at bedside   REVIEW OF SYSTEMS:    Review of Systems  Constitutional: Negative for fever, chills weight loss HENT: Negative for ear pain, nosebleeds, congestion, facial swelling, rhinorrhea, neck pain, neck stiffness and ear discharge.   Respiratory: Negative for cough, shortness of breath, wheezing  Cardiovascular: Negative for chest pain, palpitations and leg swelling.  Gastrointestinal: Negative for heartburn, abdominal pain, vomiting, diarrhea or consitpation Genitourinary: Negative for dysuria, urgency, frequency, hematuria Musculoskeletal: Negative for back pain or joint pain Neurological: Negative for dizziness, seizures, syncope, focal weakness,  numbness and headaches.  Hematological: Does not bruise/bleed easily.  Psychiatric/Behavioral: Negative for hallucinations, confusion, dysphoric mood SKIN  Redness left lower leg/foot previous 1st ray amputation    Tolerating Diet: yes      DRUG ALLERGIES:   Allergies  Allergen Reactions  . Contrast Media [Iodinated Diagnostic Agents] Shortness Of Breath  . Morphine And Related Other (See Comments)    Hallucinations   . Niacin And Related Dermatitis  . Other     Other reaction(s): SHORTNESS OF BREATH  . Amlodipine Rash    VITALS:  Blood pressure 94/78, pulse 66, temperature (!) 97.5 F (36.4 C), temperature source Oral, resp. rate 18, height 5\' 6"  (1.676 m), weight 84.8 kg, SpO2 98 %.  PHYSICAL EXAMINATION:  Constitutional: Appears well-developed and well-nourished. No distress. HENT: Normocephalic. Marland Kitchen Oropharynx is clear and moist.  Eyes: Conjunctivae and EOM are normal. PERRLA, no scleral icterus.  Neck: Normal ROM. Neck supple. No JVD. No tracheal deviation. CVS: RRR, S1/S2 +, no murmurs, no gallops, no  carotid bruit.  Pulmonary: Effort and breath sounds normal, no stridor, rhonchi, wheezes, rales.  Abdominal: Soft. BS +,  no distension, tenderness, rebound or guarding.  Musculoskeletal: Normal range of motion. No edema and no tenderness.  Neuro: Alert. CN 2-12 grossly intact. No focal deficits. Skin: Leg is currently dressed As per vascular note First ray amputation site with dehiscence and necrosis. Heel ulceration also noted. Small scattered ulcers noted to the dorsum of the foot.Unable to palpate pedal pulses. Erythema is noted from the calf distally.        Marland Kitchen Psychiatric: Normal mood and affect.      LABORATORY PANEL:   CBC Recent Labs  Lab 10/05/18 0201  WBC 11.8*  HGB 10.8*  HCT 35.1*  PLT 281   ------------------------------------------------------------------------------------------------------------------  Chemistries  Recent Labs  Lab 10/05/18 0201  NA 143  K 4.7  CL 111  CO2 26  GLUCOSE 202*  BUN 30*  CREATININE 1.35*  CALCIUM 8.8*   ------------------------------------------------------------------------------------------------------------------  Cardiac Enzymes No results for input(s): TROPONINI in the last 168 hours. ------------------------------------------------------------------------------------------------------------------  RADIOLOGY:  No results found.   ASSESSMENT AND PLAN:   85 year old male with history of diabetes and first toe amputation with recent stent and angioplasty who presented to the hospital after being evaluated by home health nurse with concerns of incision necrosis.   1.  Diabetic left leg and foot infection with recent first ray amputation: Patient to go tomorrow for below-knee amputation Continue cefepime and vancomycin.  2.  Diabetes: Continue Levemir, NovoLog, sliding scale and ADA diet DM nurse following.  3.  Hypothyroidism: Continue Synthroid  4.  Essential hypertension: Continue metoprolol  5.  BPH:  Continue Proscar and tamsulosin  6.  Glaucoma: Continue eyedrops  7.  Chronic atrial fibrillation: Currently in normal sinus rhythm. Off of Eliquis for now due to surgery.  Continue heparin drip. Restart Eliquis after procedure when cleared by Dr Lucky Cowboy   Management plans discussed with the patient and daughter and they are in agreement.  CODE STATUS: FULL  TOTAL TIME TAKING CARE OF THIS PATIENT: 25 minutes.     POSSIBLE D/C 3 days, DEPENDING ON CLINICAL CONDITION.   Bettey Costa M.D on 10/05/2018 at 10:54 AM  Between 7am to 6pm - Pager - [phone] After 6pm go to [email or web address] - password [email or web address] [name]  Office  [phone]  CC: Primary care physician; Shawn Fenton, NP  Note: This dictation was prepared with Dragon dictation along with smaller phrase technology. Any transcriptional errors that result from this process are unintentional.

## 2018-10-05 NOTE — Progress Notes (Signed)
[name]                                                      Patient Demographics  [name], is a 85 y.o. male   MRN: [id number]   DOB - October 19, 1931  Admit Date - 10/03/2018    Outpatient Primary MD for the patient is Jearld Fenton, NP  Consult requested in the Hospital by Bettey Costa, MD, On 10/05/2018    Past Medical History:  Diagnosis Date  . Arthritis   . CAD    a. MI 01/29/1996 tx'd w/ TPA @ [facility]; b. Myoview 06/2005: EF 50%, scar @ apex, mild peri-infarct ischemia  . Cancer ([place])    skin  . Chronic atrial fibrillation    a. since 2006; b. on warfarin  . Chronic diastolic CHF (congestive heart failure) ([place])    a. echo 04/2006: EF lower limits of nl, mod LVH, mild aortic root dilatation, & mild MR, biatrial enlargement; b. echo 04/2013: EF 60%, mod dilated LA, mild MR & TR, mod pulm HTN w/ RV systolic pressure 53, c. echo 04/21/14: EF 55-60%, unable to exclude WMA, severely dilated LA 6.6 cm, nl RVSP, mildly dilated aortic root  . COPD (chronic obstructive pulmonary disease) (HCC)    oxygen prn at home  . CVA [id number]   x2  . DM   . Falls   . GERD (gastroesophageal reflux disease)   . History of kidney stones   . HYPERLIPIDEMIA   . HYPERTENSION   . Kidney stone    a. s/p left ureteral stenting 04/24/14  . Left arm weakness    limited movement. S/P fall injury  . Neuropathy of both feet   . Poor balance   . Wears dentures    full upper and lower      Past Surgical History:  Procedure Laterality Date  . AMPUTATION TOE Left 06/13/2018   Procedure: 1st Ray Resection Left;  Surgeon: Sharlotte Alamo, DPM;  Location: [facility];  Service: Podiatry;  Laterality: Left;  . BLADDER SURGERY     stent placement   . CARDIAC CATHETERIZATION  1997   [facility]  . CAROTID STENT INSERTION  1997  . CATARACT EXTRACTION W/PHACO Left 10/11/2017   Procedure:  CATARACT EXTRACTION PHACO AND INTRAOCULAR LENS PLACEMENT ([place]) COMPLICATED LEFT DIABETIC;  Surgeon: Leandrew Koyanagi, MD;  Location: [facility];  Service: Ophthalmology;  Laterality: Left;  MALYUGIN Diabetic - insulin  . CATARACT EXTRACTION W/PHACO Right 11/30/2017   Procedure: CATARACT EXTRACTION PHACO AND INTRAOCULAR LENS PLACEMENT ([place]) COMPLICATED  RIGHT DIABETIC;  Surgeon: Leandrew Koyanagi, MD;  Location: [facility];  Service: Ophthalmology;  Laterality: Right;  Diabetic - insulin  . CIRCUMCISION  2016  . CORONARY ANGIOPLASTY  1997   s/p stent placement x 2   . CYSTOSCOPY W/ URETERAL STENT REMOVAL Left 10/09/2014   Procedure: CYSTOSCOPY WITH STENT REMOVAL;  Surgeon: Hollice Espy, MD;  Location: [facility];  Service: Urology;  Laterality: Left;  . CYSTOSCOPY WITH STENT PLACEMENT Left 10/09/2014   Procedure: CYSTOSCOPY WITH STENT PLACEMENT;  Surgeon: Hollice Espy, MD;  Location: [facility];  Service: Urology;  Laterality: Left;  . IRRIGATION AND DEBRIDEMENT FOOT Left 09/15/2018   Procedure: IRRIGATION AND DEBRIDEMENT FOOT DELAY CLOSURE;  Surgeon: Samara Deist, DPM;  Location: [facility];  Service: Podiatry;  Laterality: Left;  . KIDNEY SURGERY  05/2013   s/p stent placement   . LOWER EXTREMITY ANGIOGRAPHY Left 06/12/2018   Procedure: Lower Extremity Angiography;  Surgeon: Algernon Huxley, MD;  Location: [facility];  Service: Cardiovascular;  Laterality: Left;  . LOWER EXTREMITY ANGIOGRAPHY Left 09/13/2018   Procedure: Lower Extremity Angiography;  Surgeon: Algernon Huxley, MD;  Location: [place] [facility];  Service: Cardiovascular;  Laterality: Left;  . stents ureters Bilateral   . TONSILLECTOMY AND ADENOIDECTOMY  1959  . URETEROSCOPY WITH HOLMIUM LASER LITHOTRIPSY Left 10/09/2014   Procedure: URETEROSCOPY WITH HOLMIUM LASER LITHOTRIPSY;  Surgeon: Hollice Espy, MD;  Location: [facility];  Service: Urology;  Laterality: Left;   HPI  [name]  is a 85 y.o. 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  Has had a couple of procedures but they have completely failed to heal now has necrosis all along the medial aspect of his foot.  He has had chronic peripheral arterial disease.  Minimal improvement in spite of intervention.    Review of Systems    In addition to the HPI above,  No Fever-chills, No Headache, No changes with Vision or hearing, No problems swallowing food or Liquids, No Chest pain, Cough or Shortness of Breath, No Abdominal pain, No Nausea or Vommitting, Bowel movements are regular, No Blood in stool or Urine, No dysuria, No new skin rashes or bruises, No new joints pains-aches,  No new weakness, tingling, numbness in any extremity, No recent weight gain or loss, No polyuria, polydypsia or polyphagia, No significant Mental Stressors.  A full 10 point Review of Systems was done, except as stated above, all other Review of Systems were negative.   Social History Social History   Tobacco Use  . Smoking status: Former Smoker    Packs/day: 2.00    Years: 40.00    Pack years: 80.00    Types: Cigarettes    Quit date: 05/24/1990    Years since quitting: 28.3  . Smokeless tobacco: Former Systems developer    Quit date: 05/24/1990  Substance Use Topics  . Alcohol use: No    Family History Family History  Problem Relation Age of Onset  . Heart disease Mother   . Diabetes Mother   . Heart disease Maternal Grandmother   . Diabetes Maternal Grandmother   . Cancer Neg Hx   . Stroke Neg Hx     Prior to Admission medications   Medication Sig Start Date End Date Taking? Authorizing Provider  acetaminophen (TYLENOL) 650 MG CR tablet Take 650 mg by mouth every 8 (eight) hours as needed for pain.    Yes [provider]  albuterol (PROAIR HFA) 108 (90 Base) MCG/ACT inhaler USE 1 TO 2 INHALATIONS EVERY 4 HOURS AS NEEDED Patient taking differently: Inhale 1-2 puffs into the lungs every 4 (four) hours as  needed for wheezing or shortness of breath.  11/09/17  Yes Wilhelmina Mcardle, MD  ALPRAZolam Duanne Moron) 0.25 MG tablet Take 1 tablet (0.25 mg total) by mouth at bedtime as needed for anxiety. 07/19/18  Yes Baity, Coralie Keens, NP  atorvastatin (LIPITOR) 40 MG tablet TAKE 1 TABLET DAILY Patient taking differently: Take 40 mg by mouth daily at 6 PM.  07/17/18  Yes Baity, Coralie Keens, NP  cephALEXin (KEFLEX) 500 MG capsule Take 500 mg by mouth 3 (three) times daily. 09/20/18 10/10/18 Yes [provider]  citalopram (CELEXA) 10 MG tablet TAKE 1 TABLET  DAILY Patient taking differently: Take 10 mg by mouth daily.  09/28/18  Yes Baity, Coralie Keens, NP  COLCRYS 0.6 MG tablet TAKE 1 TABLET TWICE A DAY AS NEEDED (ACUTE GOUT FLARE) USE AS DIRECTED Patient taking differently: Take 0.6 mg by mouth 2 (two) times daily as needed (acute gout flare).  07/06/18  Yes Baity, Coralie Keens, NP  COMBIGAN 0.2-0.5 % ophthalmic solution Place 1 drop into both eyes 2 (two) times a day.  06/26/18  Yes [provider]  diclofenac sodium (VOLTAREN) 1 % GEL Apply 4 g topically 4 (four) times daily. 01/05/18  Yes Copland, Frederico Hamman, MD  dorzolamide (TRUSOPT) 2 % ophthalmic solution Place 1 drop into both eyes 3 (three) times daily.  03/31/16  Yes [provider]  ELIQUIS 5 MG TABS tablet TAKE 1 TABLET TWICE A DAY Patient taking differently: Take 2.5 mg by mouth 2 (two) times daily.  09/18/18  Yes Gollan, Kathlene November, MD  ezetimibe (ZETIA) 10 MG tablet TAKE 1 TABLET DAILY Patient taking differently: Take 10 mg by mouth daily.  07/17/18  Yes Baity, Coralie Keens, NP  finasteride (PROSCAR) 5 MG tablet TAKE 1 TABLET DAILY Patient taking differently: Take 5 mg by mouth daily.  06/29/17  Yes Baity, Coralie Keens, NP  gabapentin (NEURONTIN) 100 MG capsule TAKE 1 CAPSULE THREE TIMES A DAY Patient taking differently: Take 100 mg by mouth 3 (three) times daily.  06/09/18  Yes Baity, Coralie Keens, NP  glipiZIDE (GLUCOTROL) 5 MG tablet Take 5 mg by mouth 2 (two)  times daily before a meal.   Yes [provider]  insulin detemir (LEVEMIR) 100 UNIT/ML injection Inject 0.28 mLs (28 Units total) into the skin daily at 10 pm. Patient taking differently: Inject 42 Units into the skin daily at 10 pm.  06/15/18  Yes Fritzi Mandes, MD  insulin lispro (HUMALOG) 100 UNIT/ML injection Inject 0.04 mLs (4 Units total) into the skin 3 (three) times daily with meals. Patient taking differently: Inject 10-18 Units into the skin 3 (three) times daily with meals. 18 am, 20 lunch, 16 supper 06/15/18  Yes Fritzi Mandes, MD  Insulin Pen Needle (BD PEN NEEDLE NANO U/F) 32G X 4 MM MISC USE THREE TIMES A DAY FOR INSULIN ADMINISTRATION 02/15/18  Yes Baity, Coralie Keens, NP  Insulin Syringe-Needle U-100 30G X 1/2" 1 ML MISC 1 each by Does not apply route 3 (three) times daily. 07/19/14  Yes Baity, Coralie Keens, NP  latanoprost (XALATAN) 0.005 % ophthalmic solution Place 1 drop into both eyes at bedtime.  04/18/16  Yes [provider]  levothyroxine (SYNTHROID) 50 MCG tablet TAKE 1 TABLET DAILY BEFORE BREAKFAST Patient taking differently: Take 50 mcg by mouth daily before breakfast.  09/25/18  Yes Baity, Coralie Keens, NP  metolazone (ZAROXOLYN) 5 MG tablet Take 1 tablet (5 mg total) by mouth daily as needed (swelling). 03/13/18  Yes Gollan, Kathlene November, MD  metoprolol succinate (TOPROL-XL) 50 MG 24 hr tablet TAKE 1 TABLET TWICE A DAY Patient taking differently: Take 50 mg by mouth 2 (two) times a day.  06/09/18  Yes Baity, Coralie Keens, NP  montelukast (SINGULAIR) 10 MG tablet TAKE 1 TABLET AT BEDTIME Patient taking differently: Take 10 mg by mouth at bedtime.  02/24/18  Yes Baity, Coralie Keens, NP  nitroGLYCERIN (NITROSTAT) 0.4 MG SL tablet Place 0.4 mg under the tongue every 5 (five) minutes as needed for chest pain.    Yes [provider]  potassium chloride SA (K-DUR,KLOR-CON) 20 MEQ  tablet Take 4 tablets (80 meq) by mouth twice daily, take an extra 1 tablet (20 meq) on the days you take  metolazone Patient taking differently: Take 80 mEq by mouth 2 (two) times daily. Take 4 tablets (80 meq) by mouth twice daily at breakfast and at lunch, take an extra 1 tablet (20 meq) on the days you take metolazone put tablet in applesauce to aid swallowing 02/10/18  Yes Gollan, Kathlene November, MD  predniSONE (DELTASONE) 10 MG tablet Take 1 tablet (10 mg total) by mouth daily with breakfast. 05/05/18  Yes Baity, Coralie Keens, NP  SYMBICORT 160-4.5 MCG/ACT inhaler USE 2 INHALATIONS TWICE A DAY Patient taking differently: Inhale 1 puff into the lungs 2 (two) times daily.  08/31/17  Yes Wilhelmina Mcardle, MD  tamsulosin (FLOMAX) 0.4 MG CAPS capsule TAKE 1 CAPSULE DAILY Patient taking differently: Take 0.4 mg by mouth daily.  09/28/18  Yes Jearld Fenton, NP  torsemide (DEMADEX) 20 MG tablet Take 40 mg by mouth 2 (two) times daily.   Yes [provider]  traMADol (ULTRAM) 50 MG tablet Take 1 tablet (50 mg total) by mouth 2 (two) times daily. 06/29/18  Yes Jearld Fenton, NP  traZODone (DESYREL) 50 MG tablet Take 2 tablets (100 mg total) by mouth at bedtime as needed for sleep. 02/16/18  Yes Baity, Coralie Keens, NP  umeclidinium bromide (INCRUSE ELLIPTA) 62.5 MCG/INH AEPB Inhale 1 puff into the lungs daily.   Yes [provider]  clotrimazole (LOTRIMIN) 1 % cream Apply 1 application topically 2 (two) times daily. Patient not taking: Reported on 10/04/2018 11/18/17   Jinny Sanders, MD    Anti-infectives (From admission, onward)   Start     Dose/Rate Route Frequency Ordered Stop   10/06/18 0000  ceFAZolin (ANCEF) IVPB 2g/100 mL premix    Note to Pharmacy: Please send with patient to OR   2 g 200 mL/hr over 30 Minutes Intravenous On call 10/05/18 1039 10/07/18 0000   10/05/18 2200  vancomycin (VANCOCIN) 1,250 mg in sodium chloride 0.9 % 250 mL IVPB  Status:  Discontinued     1,250 mg 166.7 mL/hr over 90 Minutes Intravenous Every 48 hours 10/03/18 2009 10/04/18 0915   10/05/18 0600  vancomycin  (VANCOCIN) IVPB 750 mg/150 ml premix     750 mg 150 mL/hr over 60 Minutes Intravenous Every 24 hours 10/04/18 0915     10/03/18 2000  vancomycin (VANCOCIN) 2,000 mg in sodium chloride 0.9 % 500 mL IVPB     2,000 mg 250 mL/hr over 120 Minutes Intravenous  Once 10/03/18 1942 10/04/18 0214   10/03/18 2000  ceFEPIme (MAXIPIME) 2 g in sodium chloride 0.9 % 100 mL IVPB     2 g 200 mL/hr over 30 Minutes Intravenous Every 12 hours 10/03/18 1942        Scheduled Meds: . ALPRAZolam  0.25 mg Oral QHS  . atorvastatin  40 mg Oral q1800  . brimonidine  1 drop Both Eyes Daily   And  . timolol  1 drop Both Eyes Daily  . citalopram  10 mg Oral Daily  . diclofenac sodium  4 g Topical QID  . dorzolamide  1 drop Both Eyes TID  . ezetimibe  10 mg Oral Daily  . finasteride  5 mg Oral Daily  . gabapentin  100 mg Oral QHS  . insulin aspart  0-5 Units Subcutaneous QHS  . insulin aspart  0-9 Units Subcutaneous TID WC  . insulin aspart  6 Units Subcutaneous TID WC  . insulin detemir  30 Units Subcutaneous Q2200  . latanoprost  1 drop Both Eyes QHS  . levothyroxine  50 mcg Oral Q0600  . metoprolol succinate  50 mg Oral BID  . mometasone-formoterol  2 puff Inhalation BID  . montelukast  10 mg Oral QHS  . potassium chloride  80 mEq Oral BID WC  . predniSONE  10 mg Oral Q breakfast  . tamsulosin  0.4 mg Oral Daily  . torsemide  40 mg Oral BID  . traMADol  50 mg Oral BID  . umeclidinium bromide  1 puff Inhalation Daily   Continuous Infusions: . sodium chloride 250 mL (10/04/18 2059)  . [START ON 10/06/2018] sodium chloride    . [START ON 10/06/2018]  ceFAZolin (ANCEF) IV    . ceFEPime (MAXIPIME) IV 2 g (10/05/18 0858)  . heparin 1,250 Units/hr (10/04/18 2041)  . vancomycin     PRN Meds:.sodium chloride, acetaminophen **OR** acetaminophen, albuterol, colchicine, HYDROcodone-acetaminophen, nitroGLYCERIN, ondansetron **OR** ondansetron (ZOFRAN) IV, traZODone  Allergies  Allergen Reactions  . Contrast  Media [Iodinated Diagnostic Agents] Shortness Of Breath  . Morphine And Related Other (See Comments)    Hallucinations   . Niacin And Related Dermatitis  . Other     Other reaction(s): SHORTNESS OF BREATH  . Amlodipine Rash    Physical Exam  Vitals  Blood pressure 94/78, pulse 66, temperature (!) 97.5 F (36.4 C), temperature source Oral, resp. rate 18, height 5\' 6"  (1.676 m), weight 84.8 kg, SpO2 98 %.  Lower Extremity exam:  Vascular: Nonpalpable  Dermatological: Large necrotic swath of skin on the medial foot at the previous amputation site.  Neurological: Peripheral neuropathy  Ortho: Exposed first metatarsal bone which appears to be necrotic as well.  Data Review  CBC Recent Labs  Lab 10/03/18 1918 10/04/18 0347 10/05/18 0201  WBC 13.9* 9.9 11.8*  HGB 11.6* 10.7* 10.8*  HCT 37.3* 35.1* 35.1*  PLT 313 265 281  MCV 91.6 93.6 93.9  MCH 28.5 28.5 28.9  MCHC 31.1 30.5 30.8  RDW 17.1* 17.2* 17.3*   ------------------------------------------------------------------------------------------------------------------  Chemistries  Recent Labs  Lab 10/03/18 1918 10/04/18 0347 10/05/18 0201  NA 138 137 143  K 5.0 4.3 4.7  CL 107 106 111  CO2 23 23 26   GLUCOSE 57* 178* 202*  BUN 38* 33* 30*  CREATININE 1.68* 1.44* 1.35*  CALCIUM 8.9 8.4* 8.8*   ------------------------------------------------------------------------------------------------------------------ estimated creatinine clearance is 41.6 mL/min (A) (by C-G formula based on SCr of 1.35 mg/dL (H)). ------------------------------------------------------------------------------------------------------------------ No results for input(s): TSH, T4TOTAL, T3FREE, THYROIDAB in the last 72 hours.  Invalid input(s): FREET3 Urinalysis    Component Value Date/Time   COLORURINE YELLOW (A) 09/10/2018 1112   APPEARANCEUR HAZY (A) 09/10/2018 1112   APPEARANCEUR Cloudy (A) 09/20/2014 1440   LABSPEC 1.009  09/10/2018 1112   LABSPEC 1.010 08/05/2014 2030   PHURINE 5.0 09/10/2018 1112   GLUCOSEU NEGATIVE 09/10/2018 1112   GLUCOSEU 50 mg/dL 08/05/2014 2030   GLUCOSEU NEGATIVE 02/14/2014 1257   HGBUR NEGATIVE 09/10/2018 1112   BILIRUBINUR NEGATIVE 09/10/2018 1112   BILIRUBINUR neg 08/22/2018 1001   BILIRUBINUR Negative 09/20/2014 1440   BILIRUBINUR Negative 08/05/2014 2030   [place] 09/10/2018 1112   PROTEINUR NEGATIVE 09/10/2018 1112   UROBILINOGEN 0.2 08/22/2018 1001   UROBILINOGEN 0.2 02/14/2014 1257   NITRITE NEGATIVE 09/10/2018 1112   LEUKOCYTESUR LARGE (A) 09/10/2018 1112   LEUKOCYTESUR 3+ 08/05/2014 2030     Imaging results:  No results found.  Assessment & Plan: Poor results with blood flow to the left foot with secondary necrosis to previous surgical interventions.  Skin color in general is poor and skin tone is very waxy and brittle.  After discussion with the patient with vascular surgery and with patient's daughter feel like the likely best approach is to go ahead and consider B-K amputation have a much better chance of healing and resolving issues.  He is at high risk for failure if we try to do any type of transmetatarsal amputation.  Subsequently MRI and sign off and the patient now be scheduled for BKA tomorrow.  Active Problems:   Diabetic foot infection [facility])   Family Communication: Plan discussed with patient   Albertine Patricia M.D on 10/05/2018 at 2:03 PM  Thank you for the consult, we will follow the patient with you in the Hospital.

## 2018-10-06 ENCOUNTER — Inpatient Hospital Stay: Payer: Medicare Other | Admitting: Anesthesiology

## 2018-10-06 ENCOUNTER — Encounter
Admission: AD | Disposition: A | Discharge status: Skilled Nursing Facility | Payer: Self-pay | Source: Home / Self Care | Attending: Internal Medicine

## 2018-10-06 ENCOUNTER — Encounter: Payer: Self-pay | Admitting: *Deleted

## 2018-10-06 DIAGNOSIS — L97529 Non-pressure chronic ulcer of other part of left foot with unspecified severity: Secondary | ICD-10-CM

## 2018-10-06 DIAGNOSIS — I739 Peripheral vascular disease, unspecified: Secondary | ICD-10-CM

## 2018-10-06 HISTORY — PX: AMPUTATION: SHX166

## 2018-10-06 LAB — APTT: aPTT: 80 seconds — ABNORMAL HIGH (ref 24–36)

## 2018-10-06 LAB — CBC
HCT: 37.7 % — ABNORMAL LOW (ref 39.0–52.0)
Hemoglobin: 11.6 g/dL — ABNORMAL LOW (ref 13.0–17.0)
MCH: 28.5 pg (ref 26.0–34.0)
MCHC: 30.8 g/dL (ref 30.0–36.0)
MCV: 92.6 fL (ref 80.0–100.0)
Platelets: 313 10*3/uL (ref 150–400)
RBC: 4.07 MIL/uL — ABNORMAL LOW (ref 4.22–5.81)
RDW: 17.2 % — ABNORMAL HIGH (ref 11.5–15.5)
WBC: 14 10*3/uL — ABNORMAL HIGH (ref 4.0–10.5)
nRBC: 0 % (ref 0.0–0.2)

## 2018-10-06 LAB — GLUCOSE, CAPILLARY
Glucose-Capillary: 91 mg/dL (ref 70–99)
Glucose-Capillary: 63 mg/dL — ABNORMAL LOW (ref 70–99)
Glucose-Capillary: 90 mg/dL (ref 70–99)
Glucose-Capillary: 236 mg/dL — ABNORMAL HIGH (ref 70–99)
Glucose-Capillary: 214 mg/dL — ABNORMAL HIGH (ref 70–99)
Glucose-Capillary: 142 mg/dL — ABNORMAL HIGH (ref 70–99)

## 2018-10-06 LAB — MAGNESIUM: Magnesium: 2.1 mg/dL (ref 1.7–2.4)

## 2018-10-06 LAB — PROTIME-INR
INR: 1.2 (ref 0.8–1.2)
Prothrombin Time: 14.8 seconds (ref 11.4–15.2)

## 2018-10-06 LAB — BASIC METABOLIC PANEL
Anion gap: 9 (ref 5–15)
BUN: 28 mg/dL — ABNORMAL HIGH (ref 8–23)
CO2: 26 mmol/L (ref 22–32)
Calcium: 9.3 mg/dL (ref 8.9–10.3)
Chloride: 106 mmol/L (ref 98–111)
Creatinine, Ser: 1.34 mg/dL — ABNORMAL HIGH (ref 0.61–1.24)
GFR calc Af Amer: 56 mL/min — ABNORMAL LOW (ref >60)
GFR calc non Af Amer: 48 mL/min — ABNORMAL LOW (ref >60)
Glucose, Bld: 109 mg/dL — ABNORMAL HIGH (ref 70–99)
Potassium: 4.9 mmol/L (ref 3.5–5.1)
Sodium: 141 mmol/L (ref 135–145)

## 2018-10-06 LAB — HEPARIN LEVEL (UNFRACTIONATED): Heparin Unfractionated: 0.53 IU/mL (ref 0.30–0.70)

## 2018-10-06 SURGERY — AMPUTATION BELOW KNEE
Anesthesia: General | Site: Leg Lower | Laterality: Left

## 2018-10-06 MED ORDER — PHENYLEPHRINE HCL (PRESSORS) 10 MG/ML IV SOLN
INTRAVENOUS | Status: DC | PRN
  Administered 2018-10-06 (×4): 100 ug via INTRAVENOUS

## 2018-10-06 MED ORDER — ONDANSETRON HCL 4 MG/2ML IJ SOLN: 4.0000 mg | Freq: Once | INTRAMUSCULAR | Status: DC | PRN

## 2018-10-06 MED ORDER — PROPOFOL 10 MG/ML IV BOLUS
INTRAVENOUS | Status: DC | PRN
  Administered 2018-10-06: 150 mg via INTRAVENOUS
  Administered 2018-10-06: 30 mg via INTRAVENOUS

## 2018-10-06 MED ORDER — FENTANYL CITRATE (PF) 100 MCG/2ML IJ SOLN
INTRAMUSCULAR | Status: AC
  Filled 2018-10-06: qty 2

## 2018-10-06 MED ORDER — APIXABAN 5 MG PO TABS
5.0000 mg | ORAL_TABLET | Freq: Two times a day (BID) | ORAL | Status: DC
  Administered 2018-10-07 – 2018-10-13 (×13): 5 mg via ORAL
  Filled 2018-10-06 (×13): qty 1

## 2018-10-06 MED ORDER — CEFAZOLIN SODIUM-DEXTROSE 2-4 GM/100ML-% IV SOLN
INTRAVENOUS | Status: AC
  Filled 2018-10-06: qty 100

## 2018-10-06 MED ORDER — DEXTROSE 50 % IV SOLN
25.0000 mL | Freq: Once | INTRAVENOUS | Status: AC
  Administered 2018-10-06: 25 mL via INTRAVENOUS

## 2018-10-06 MED ORDER — PROPOFOL 10 MG/ML IV BOLUS
INTRAVENOUS | Status: AC
  Filled 2018-10-06: qty 20

## 2018-10-06 MED ORDER — LIDOCAINE HCL (CARDIAC) PF 100 MG/5ML IV SOSY
PREFILLED_SYRINGE | INTRAVENOUS | Status: DC | PRN
  Administered 2018-10-06: 60 mg via INTRAVENOUS

## 2018-10-06 MED ORDER — VITAMIN C 500 MG PO TABS
250.0000 mg | ORAL_TABLET | Freq: Two times a day (BID) | ORAL | Status: DC
  Administered 2018-10-06 – 2018-10-13 (×14): 250 mg via ORAL
  Filled 2018-10-06 (×14): qty 1

## 2018-10-06 MED ORDER — MORPHINE SULFATE (PF) 2 MG/ML IV SOLN
2.0000 mg | INTRAVENOUS | Status: DC | PRN
  Administered 2018-10-06 – 2018-10-12 (×9): 2 mg via INTRAVENOUS
  Filled 2018-10-06 (×9): qty 1

## 2018-10-06 MED ORDER — DEXTROSE-NACL 5-0.9 % IV SOLN
INTRAVENOUS | Status: DC
  Administered 2018-10-06 (×3): via INTRAVENOUS

## 2018-10-06 MED ORDER — POTASSIUM CHLORIDE CRYS ER 20 MEQ PO TBCR
40.0000 meq | EXTENDED_RELEASE_TABLET | Freq: Two times a day (BID) | ORAL | Status: DC
  Administered 2018-10-07 – 2018-10-13 (×13): 40 meq via ORAL
  Filled 2018-10-06 (×13): qty 2

## 2018-10-06 MED ORDER — ENSURE MAX PROTEIN PO LIQD
11.0000 [oz_av] | Freq: Two times a day (BID) | ORAL | Status: DC
  Administered 2018-10-07 – 2018-10-12 (×9): 11 [oz_av] via ORAL
  Filled 2018-10-06: qty 330

## 2018-10-06 MED ORDER — ROCURONIUM BROMIDE 50 MG/5ML IV SOLN
INTRAVENOUS | Status: AC
  Filled 2018-10-06: qty 1

## 2018-10-06 MED ORDER — FENTANYL CITRATE (PF) 100 MCG/2ML IJ SOLN
INTRAMUSCULAR | Status: DC | PRN
  Administered 2018-10-06 (×3): 25 ug via INTRAVENOUS

## 2018-10-06 MED ORDER — DEXTROSE 50 % IV SOLN
INTRAVENOUS | Status: AC
  Administered 2018-10-06: 12:00:00 25 mL via INTRAVENOUS
  Filled 2018-10-06: qty 50

## 2018-10-06 MED ORDER — FENTANYL CITRATE (PF) 100 MCG/2ML IJ SOLN
INTRAMUSCULAR | Status: AC
  Administered 2018-10-06: 25 ug via INTRAVENOUS
  Filled 2018-10-06: qty 2

## 2018-10-06 MED ORDER — INSULIN ASPART 100 UNIT/ML ~~LOC~~ SOLN
10.0000 [IU] | Freq: Three times a day (TID) | SUBCUTANEOUS | Status: DC
  Administered 2018-10-08 – 2018-10-09 (×5): 10 [IU] via SUBCUTANEOUS
  Filled 2018-10-06 (×5): qty 1

## 2018-10-06 MED ORDER — HYDROCODONE-ACETAMINOPHEN 5-325 MG PO TABS
1.0000 | ORAL_TABLET | ORAL | Status: DC | PRN
  Administered 2018-10-07 – 2018-10-08 (×5): 1 via ORAL
  Filled 2018-10-06 (×5): qty 1

## 2018-10-06 MED ORDER — OCUVITE-LUTEIN PO CAPS
1.0000 | ORAL_CAPSULE | Freq: Every day | ORAL | Status: DC
  Administered 2018-10-07 – 2018-10-13 (×6): 1 via ORAL
  Filled 2018-10-06 (×8): qty 1

## 2018-10-06 MED ORDER — FENTANYL CITRATE (PF) 100 MCG/2ML IJ SOLN
25.0000 ug | INTRAMUSCULAR | Status: DC | PRN
  Administered 2018-10-06 (×4): 25 ug via INTRAVENOUS

## 2018-10-06 MED ORDER — SUCCINYLCHOLINE CHLORIDE 20 MG/ML IJ SOLN
INTRAMUSCULAR | Status: DC | PRN
  Administered 2018-10-06: 80 mg via INTRAVENOUS

## 2018-10-06 MED ORDER — EPHEDRINE SULFATE 50 MG/ML IJ SOLN
INTRAMUSCULAR | Status: DC | PRN
  Administered 2018-10-06: 10 mg via INTRAVENOUS

## 2018-10-06 SURGICAL SUPPLY — 41 items
BANDAGE ELASTIC 6 LF NS (GAUZE/BANDAGES/DRESSINGS) ×2 IMPLANT
BLADE SAGITTAL WIDE XTHICK NO (BLADE) ×1 IMPLANT
BLADE SAW SAG 25.4X90 (BLADE) ×1 IMPLANT
BNDG CMPR MED 5X6 ELC HKLP NS (GAUZE/BANDAGES/DRESSINGS) ×1
BNDG COHESIVE 4X5 TAN STRL (GAUZE/BANDAGES/DRESSINGS) ×2 IMPLANT
BNDG GAUZE 4.5X4.1 6PLY STRL (MISCELLANEOUS) ×4 IMPLANT
BRUSH SCRUB EZ  4% CHG (MISCELLANEOUS)
BRUSH SCRUB EZ 4% CHG (MISCELLANEOUS) ×1 IMPLANT
CANISTER SUCT 1200ML W/VALVE (MISCELLANEOUS) ×2 IMPLANT
CHLORAPREP W/TINT 26ML (MISCELLANEOUS) ×3 IMPLANT
COVER WAND RF STERILE (DRAPES) ×2 IMPLANT
DRAIN PENROSE 1/4X12 LTX (DRAIN) IMPLANT
DRAPE INCISE IOBAN 66X45 STRL (DRAPES) IMPLANT
DRAPE INCISE IOBAN 66X60 STRL (DRAPES) ×1 IMPLANT
ELECT CAUTERY BLADE 6.4 (BLADE) ×2 IMPLANT
ELECT REM PT RETURN 9FT ADLT (ELECTROSURGICAL) ×2
ELECTRODE REM PT RTRN 9FT ADLT (ELECTROSURGICAL) ×1 IMPLANT
GAUZE XEROFORM 1X8 LF (GAUZE/BANDAGES/DRESSINGS) ×3 IMPLANT
GLOVE BIO SURGEON STRL SZ7 (GLOVE) ×4 IMPLANT
GLOVE INDICATOR 7.5 STRL GRN (GLOVE) ×2 IMPLANT
GOWN STRL REUS W/ TWL LRG LVL3 (GOWN DISPOSABLE) ×2 IMPLANT
GOWN STRL REUS W/ TWL XL LVL3 (GOWN DISPOSABLE) ×1 IMPLANT
GOWN STRL REUS W/TWL LRG LVL3 (GOWN DISPOSABLE) ×4
GOWN STRL REUS W/TWL XL LVL3 (GOWN DISPOSABLE) ×2
HANDLE YANKAUER SUCT BULB TIP (MISCELLANEOUS) ×2 IMPLANT
KIT TURNOVER KIT A (KITS) ×2 IMPLANT
LABEL OR SOLS (LABEL) ×2 IMPLANT
NS IRRIG 1000ML POUR BTL (IV SOLUTION) ×2 IMPLANT
PACK EXTREMITY ARMC (MISCELLANEOUS) ×2 IMPLANT
PAD ABD DERMACEA PRESS 5X9 (GAUZE/BANDAGES/DRESSINGS) ×4 IMPLANT
PAD PREP 24X41 OB/GYN DISP (PERSONAL CARE ITEMS) ×1 IMPLANT
SPONGE LAP 18X18 RF (DISPOSABLE) ×3 IMPLANT
STAPLER SKIN PROX 35W (STAPLE) ×2 IMPLANT
STOCKINETTE M/LG 89821 (MISCELLANEOUS) ×2 IMPLANT
SUT SILK 2 0 (SUTURE) ×2
SUT SILK 2 0 SH (SUTURE) ×3 IMPLANT
SUT SILK 2-0 18XBRD TIE 12 (SUTURE) ×1 IMPLANT
SUT SILK 3 0 (SUTURE)
SUT SILK 3-0 18XBRD TIE 12 (SUTURE) ×1 IMPLANT
SUT VIC AB 0 CT1 36 (SUTURE) ×4 IMPLANT
SUT VIC AB 2-0 CT1 (SUTURE) ×4 IMPLANT

## 2018-10-06 NOTE — Care Management Important Message (Signed)
Important Message  Patient Details  Name: [name]. MRN: [id number] Date of Birth: Apr 17, 1931   Medicare Important Message Given:  Yes     Juliann Pulse A Ilma Achee 10/06/2018, 11:34 AM

## 2018-10-06 NOTE — Progress Notes (Signed)
Inpatient Diabetes Program Recommendations  AACE/ADA: New Consensus Statement on Inpatient Glycemic Control  Target Ranges:  Prepandial:   less than 140 mg/dL      Peak postprandial:   less than 180 mg/dL (1-2 hours)      Critically ill patients:  140 - 180 mg/dL   Results for [name], [name]" (MRN [id number]) as of 10/06/2018 07:57  Ref. Range 10/05/2018 07:38 10/05/2018 11:39 10/05/2018 16:53 10/05/2018 21:16 10/06/2018 07:52  Glucose-Capillary Latest Ref Range: 70 - 99 mg/dL 100 (H) 163 (H) 357 (H) 248 (H) 91   Review of Glycemic Control  DM2 Outpatient Diabetes medications:Levemir 42 units QHS, Humalog 18 units with breakfast, Humalog 3 units with lunch, Humalog 10 units with supper Current orders for Inpatient glycemic control:Levemir 30 units QHS, Novolog 4unit TID with meals, Novolog 0-9 units TID with meals, Novolog 0-5 units QHS; Prednisone 10 mg QAM  Inpatient Diabetes Program Recommendations:  Insulin - Meal Coverage:If Prednisone is continued, please consider changing meal coverage to Novolog 6 units QAM with breakfast, Novolog 10 units BID with lunch and supper (if patient eats at least 50% of meals).  Thanks, Barnie Alderman, RN, MSN, CDE Diabetes Coordinator Inpatient Diabetes Program [phone] (Team Pager from 8am to 5pm)

## 2018-10-06 NOTE — TOC Initial Note (Signed)
Transition of Care (TOC) - Initial/Assessment Note    Patient Details  Name: [name]. MRN: [id number] Date of Birth: March 19, 1932  Transition of Care [facility]) CM/SW Contact:    [name], [name] Phone Number: [phone]  10/06/2018, 11:54 AM  Clinical Narrative: Patient is having surgery today (left BKA). Clinical Social Worker (CSW) met with patient and his daughter [name] [phone] cell, [phone] home was at bedside. Patient was alert and oriented X4 and was laying in the bed. CSW introduced self and explained role of CSW department. Per patient he lives in [place] with his wife [name]. CSW explained that patient will likely need to go to a SNF after this surgery. Per patient he was at [facility] in March 2020 and would like to go there again. CSW explained to patient and daughter that SNFs are not allowing any visitors at this time unless a patient is at the end of life. Patient reported that he doesn't like the idea of being quarantined in a facility without family. CSW provided emotional support and explained that most facilities allow window visits and face time calls. Patient and daughter are agreeable to SNF search in [place]. FL2 complete and faxed out. CSW will continue to follow and assist as needed.                Expected Discharge Plan: Skilled Nursing Facility Barriers to Discharge: Continued Medical Work up   Patient Goals and CMS Choice        Expected Discharge Plan and Services Expected Discharge Plan: [place] In-house Referral: Clinical Social Work Discharge Planning Services: CM Consult   Living arrangements for the past 2 months: [place]                                      Prior Living Arrangements/Services Living arrangements for the past 2 months: [place] with:: Spouse Patient language and need for interpreter reviewed:: No Do you feel safe going back to the place  where you live?: Yes      Need for Family Participation in Patient Care: Yes (Comment) Care giver support system in place?: No (comment) Current home services: Home PT(Patient is open to [facility]) Criminal Activity/Legal Involvement Pertinent to Current Situation/Hospitalization: No - Comment as needed  Activities of Daily Living Home Assistive Devices/Equipment: Environmental consultant (specify type), Wheelchair, Bedside commode/3-in-1, Shower chair with back, CBG Meter, Dentures (specify type) ADL Screening (condition at time of admission) Patient's cognitive ability adequate to safely complete daily activities?: Yes Is the patient deaf or have difficulty hearing?: No Does the patient have difficulty seeing, even when wearing glasses/contacts?: No Does the patient have difficulty concentrating, remembering, or making decisions?: No Patient able to express need for assistance with ADLs?: Yes Does the patient have difficulty dressing or bathing?: Yes Independently performs ADLs?: Yes (appropriate for developmental age) Communication: Independent Dressing (OT): Needs assistance Is this a change from baseline?: Pre-admission baseline Grooming: Independent Feeding: Independent Bathing: Needs assistance Is this a change from baseline?: Pre-admission baseline Toileting: Needs assistance Is this a change from baseline?: Pre-admission baseline In/Out Bed: Needs assistance Is this a change from baseline?: Pre-admission baseline Walks in Home: Independent with device (comment) Is this a change from baseline?: Pre-admission baseline Does the patient have difficulty walking or climbing stairs?: Yes Weakness of Legs: Both Weakness of Arms/Hands: Both  Permission  Sought/Granted Permission sought to share information with : Chartered certified accountant granted to share information with : Yes, Verbal Permission Granted              Emotional Assessment Appearance:: Appears stated age    Affect (typically observed): Pleasant, Calm Orientation: : Oriented to Self, Oriented to Place, Oriented to  Time, Oriented to Situation Alcohol / Substance Use: Not Applicable Psych Involvement: No (comment)  Admission diagnosis:  Osteomyolytis of left foot Patient Active Problem List   Diagnosis Date Noted  . Diabetic foot infection ([place]) 10/03/2018  . Pressure injury of skin 09/11/2018  . Sepsis ([place]) 09/10/2018  . HCAP (healthcare-associated pneumonia) 08/24/2018  . Diabetic infection of left foot ([place]) 06/09/2018  . Acquired hypothyroidism 11/22/2016  . Osteoarthritis, knee 11/22/2016  . Diabetes type 2, controlled ([place]) 06/13/2015  . BPH (benign prostatic hyperplasia) 05/14/2014  . Anxiety 05/14/2014  . Chronic obstructive pulmonary disease ([place]) 05/14/2014  . Chronic diastolic CHF (congestive heart failure) ([place])   . Insomnia 08/07/2013  . Hyperlipidemia 02/26/2009  . MITRAL REGURGITATION 02/26/2009  . Essential hypertension 02/26/2009  . MI 02/26/2009  . Coronary artery disease of native artery of native heart with stable angina pectoris ([place]) 02/26/2009  . Chronic atrial fibrillation 02/26/2009  . CVA (cerebral vascular accident) ([place]) 02/26/2009   PCP:  Shawn Fenton, NP Pharmacy:   [id number] [name], [place] [address] Phone: [phone] Fax: [phone]  [facility] [place], [place] [address] [place] [address] Phone: [phone] Fax: [place], [place] [address] Phone: [phone] Fax: [phone]     Social Determinants of Health (SDOH) Interventions    Readmission Risk Interventions Readmission Risk Prevention Plan 09/16/2018  Transportation Screening Complete  Medication  Review ([place]) Complete  HRI or [place] Complete  SW Recovery Care/Counseling Consult Patient refused  Palliative Care Screening Patient [place] Patient Refused  Some recent data might be hidden

## 2018-10-06 NOTE — Consult Note (Signed)
Pharmacy Antibiotic Note  [name]. is a 85 y.o. male admitted on 10/03/2018 with wound infection.  Pharmacy has been consulted for Cefepime and Vancomycin dosing. Patient recently had great toe amputated ~2 weeks ago.   BMI 30.17   Plan: 1) Cefepime 2 g IV Q12h  2)  Vancomycin 750 mg IV Q 24 hrs. Goal AUC 400-550. Expected AUC: 536 SCr used: 1.44 Expected Cmin 15.1 Will need to closely monitor renal function.   6/26- for BKA today   Height: 5\' 6"  (167.6 cm) Weight: 186 lb 15.2 oz (84.8 kg) IBW/kg (Calculated) : 63.8  Temp (24hrs), Avg:97.7 F (36.5 C), Min:97.5 F (36.4 C), Max:98 F (36.7 C)  Recent Labs  Lab 10/03/18 1918 10/04/18 0347 10/05/18 0201 10/06/18 0504  WBC 13.9* 9.9 11.8* 14.0*  CREATININE 1.68* 1.44* 1.35* 1.34*    Estimated Creatinine Clearance: 41.9 mL/min (A) (by C-G formula based on SCr of 1.34 mg/dL (H)).    Allergies  Allergen Reactions  . Contrast Media [Iodinated Diagnostic Agents] Shortness Of Breath  . Morphine And Related Other (See Comments)    Hallucinations   . Niacin And Related Dermatitis  . Other     Other reaction(s): SHORTNESS OF BREATH  . Amlodipine Rash    Antimicrobials this admission: 6/23 Vancomycin >>  6/23 Cefepime >>  Dose adjustments this admission: 6/24  Scr improved  Vanc chg from 1250 q48h to 750 q24h  Microbiology results: N/A  Thank you for allowing pharmacy to be a part of this patient's care.  [name] 10/06/2018 9:59 AM

## 2018-10-06 NOTE — NC FL2 (Signed)
[place] LEVEL OF CARE SCREENING TOOL     IDENTIFICATION  Patient Name: [name]. Birthdate: Apr 22, 1931 Sex: male Admission Date (Current Location): 10/03/2018  [place] and [place] Number:  Engineering geologist and Address:  [facility], [address], [place], [place] [address]      Provider Number: [id number]  Attending Physician Name and Address:  Bettey Costa, MD  Relative Name and Phone Number:       Current Level of Care: Hospital Recommended Level of Care: [place] Prior Approval Number:    Date Approved/Denied:   PASRR Number: [id number]  Discharge Plan: SNF    Current Diagnoses: Patient Active Problem List   Diagnosis Date Noted  . Diabetic foot infection ([place]) 10/03/2018  . Pressure injury of skin 09/11/2018  . Sepsis ([place]) 09/10/2018  . HCAP (healthcare-associated pneumonia) 08/24/2018  . Diabetic infection of left foot ([place]) 06/09/2018  . Acquired hypothyroidism 11/22/2016  . Osteoarthritis, knee 11/22/2016  . Diabetes type 2, controlled ([place]) 06/13/2015  . BPH (benign prostatic hyperplasia) 05/14/2014  . Anxiety 05/14/2014  . Chronic obstructive pulmonary disease ([place]) 05/14/2014  . Chronic diastolic CHF (congestive heart failure) ([place])   . Insomnia 08/07/2013  . Hyperlipidemia 02/26/2009  . MITRAL REGURGITATION 02/26/2009  . Essential hypertension 02/26/2009  . MI 02/26/2009  . Coronary artery disease of native artery of native heart with stable angina pectoris ([place]) 02/26/2009  . Chronic atrial fibrillation 02/26/2009  . CVA (cerebral vascular accident) ([place]) 02/26/2009    Orientation RESPIRATION BLADDER Height & Weight     Self, Time, Situation, Place  Normal Continent Weight: 186 lb 15.2 oz (84.8 kg) Height:  5\' 6"  (167.6 cm)  BEHAVIORAL SYMPTOMS/MOOD NEUROLOGICAL BOWEL NUTRITION STATUS      Continent Diet(NPO for surgery to be advanced.)  AMBULATORY STATUS COMMUNICATION OF  NEEDS Skin   Extensive Assist Verbally Surgical wounds(Incision: New Left BKA)                       Personal Care Assistance Level of Assistance  Bathing, Feeding, Dressing Bathing Assistance: Maximum assistance Feeding assistance: Limited assistance Dressing Assistance: Maximum assistance     Functional Limitations Info  Sight, Hearing, Speech Sight Info: Impaired Hearing Info: Impaired Speech Info: Adequate    SPECIAL CARE FACTORS FREQUENCY  PT (By licensed PT), OT (By licensed OT)     PT Frequency: 5 OT Frequency: 5            Contractures      Additional Factors Info  Allergies, Code Status Code Status Info: Full Code. Allergies Info: Contrast Media Iodinated Diagnostic Agents,Morphine And Related, Niacin And Related,Other, Amlodipine           Current Medications (10/06/2018):  This is the current hospital active medication list Current Facility-Administered Medications  Medication Dose Route Frequency Provider Last Rate Last Dose  . [MAR Hold] 0.9 %  sodium chloride infusion   Intravenous PRN Bettey Costa, MD 10 mL/hr at 10/06/18 0400    . 0.9 %  sodium chloride infusion   Intravenous Continuous Stegmayer, Kimberly A, PA-C   Stopped at 10/06/18 0501  . [MAR Hold] acetaminophen (TYLENOL) tablet 650 mg  650 mg Oral Q6H PRN Loletha Grayer, MD   650 mg at 10/04/18 2106   Or  . [MAR Hold] acetaminophen (TYLENOL) suppository 650 mg  650 mg Rectal Q6H PRN Loletha Grayer, MD      . Doug Sou Hold] albuterol (PROVENTIL) (  2.5 MG/3ML) 0.083% nebulizer solution 2.5 mg  2.5 mg Inhalation Q4H PRN Loletha Grayer, MD      . Doug Sou Hold] ALPRAZolam Duanne Moron) tablet 0.25 mg  0.25 mg Oral QHS Loletha Grayer, MD   0.25 mg at 10/05/18 2043  . [MAR Hold] atorvastatin (LIPITOR) tablet 40 mg  40 mg Oral q1800 Loletha Grayer, MD   40 mg at 10/05/18 1708  . [MAR Hold] brimonidine (ALPHAGAN) 0.2 % ophthalmic solution 1 drop  1 drop Both Eyes Daily Loletha Grayer, MD   1 drop at  10/06/18 0801   And  . [MAR Hold] timolol (TIMOPTIC) 0.5 % ophthalmic solution 1 drop  1 drop Both Eyes Daily Loletha Grayer, MD   1 drop at 10/06/18 0801  . [MAR Hold] ceFAZolin (ANCEF) IVPB 2g/100 mL premix  2 g Intravenous On Call Stegmayer, Janalyn Harder, PA-C      . [MAR Hold] ceFEPIme (MAXIPIME) 2 g in sodium chloride 0.9 % 100 mL IVPB  2 g Intravenous Q12H Kristeen Miss R, RPH 200 mL/hr at 10/06/18 0740 2 g at 10/06/18 0740  . [MAR Hold] citalopram (CELEXA) tablet 10 mg  10 mg Oral Daily Loletha Grayer, MD   10 mg at 10/05/18 0849  . [MAR Hold] colchicine tablet 0.6 mg  0.6 mg Oral Daily PRN Loletha Grayer, MD      . Doug Sou Hold] diclofenac sodium (VOLTAREN) 1 % transdermal gel 4 g  4 g Topical QID Loletha Grayer, MD   4 g at 10/05/18 2044  . [MAR Hold] dorzolamide (TRUSOPT) 2 % ophthalmic solution 1 drop  1 drop Both Eyes TID Loletha Grayer, MD   1 drop at 10/06/18 0802  . [MAR Hold] ezetimibe (ZETIA) tablet 10 mg  10 mg Oral Daily Loletha Grayer, MD   10 mg at 10/05/18 0850  . [MAR Hold] finasteride (PROSCAR) tablet 5 mg  5 mg Oral Daily Loletha Grayer, MD   5 mg at 10/05/18 0848  . [MAR Hold] gabapentin (NEURONTIN) capsule 100 mg  100 mg Oral QHS Loletha Grayer, MD   100 mg at 10/05/18 2044  . [MAR Hold] HYDROcodone-acetaminophen (NORCO/VICODIN) 5-325 MG per tablet 1 tablet  1 tablet Oral Q6H PRN Loletha Grayer, MD   1 tablet at 10/06/18 0503  . [MAR Hold] insulin aspart (novoLOG) injection 0-5 Units  0-5 Units Subcutaneous QHS Bettey Costa, MD   2 Units at 10/05/18 2132  . [MAR Hold] insulin aspart (novoLOG) injection 0-9 Units  0-9 Units Subcutaneous TID WC Bettey Costa, MD   9 Units at 10/05/18 1704  . [MAR Hold] insulin aspart (novoLOG) injection 10 Units  10 Units Subcutaneous TID WC Mody, Sital, MD      . Doug Sou Hold] insulin detemir (LEVEMIR) injection 30 Units  30 Units Subcutaneous Q2200 Bettey Costa, MD   30 Units at 10/05/18 2133  . [MAR Hold] latanoprost (XALATAN)  0.005 % ophthalmic solution 1 drop  1 drop Both Eyes QHS Loletha Grayer, MD   1 drop at 10/05/18 2049  . [MAR Hold] levothyroxine (SYNTHROID) tablet 50 mcg  50 mcg Oral Q0600 Loletha Grayer, MD   50 mcg at 10/05/18 0725  . [MAR Hold] metoprolol succinate (TOPROL-XL) 24 hr tablet 50 mg  50 mg Oral BID Loletha Grayer, MD   50 mg at 10/06/18 0806  . [MAR Hold] mometasone-formoterol (DULERA) 200-5 MCG/ACT inhaler 2 puff  2 puff Inhalation BID Loletha Grayer, MD   2 puff at 10/06/18 0741  . [MAR Hold] montelukast (SINGULAIR) tablet  10 mg  10 mg Oral QHS Loletha Grayer, MD   10 mg at 10/05/18 2043  . [MAR Hold] mupirocin ointment (BACTROBAN) 2 % 1 application  1 application Nasal BID Bettey Costa, MD   1 application at 97/67/34 0803  . [MAR Hold] nitroGLYCERIN (NITROSTAT) SL tablet 0.4 mg  0.4 mg Sublingual Q5 min PRN Loletha Grayer, MD      . Doug Sou Hold] ondansetron [facility]) tablet 4 mg  4 mg Oral Q6H PRN Loletha Grayer, MD       Or  . Doug Sou Hold] ondansetron [facility]) injection 4 mg  4 mg Intravenous Q6H PRN Wieting, Richard, MD      . Doug Sou Hold] potassium chloride SA (K-DUR) CR tablet 40 mEq  40 mEq Oral BID WC Mody, Sital, MD      . Doug Sou Hold] predniSONE (DELTASONE) tablet 10 mg  10 mg Oral Q breakfast Loletha Grayer, MD   10 mg at 10/05/18 0849  . [MAR Hold] tamsulosin (FLOMAX) capsule 0.4 mg  0.4 mg Oral Daily Loletha Grayer, MD   0.4 mg at 10/05/18 0850  . [MAR Hold] torsemide (DEMADEX) tablet 40 mg  40 mg Oral BID Loletha Grayer, MD   40 mg at 10/05/18 1324  . [MAR Hold] traMADol (ULTRAM) tablet 50 mg  50 mg Oral BID Loletha Grayer, MD   50 mg at 10/05/18 2043  . [MAR Hold] traZODone (DESYREL) tablet 100 mg  100 mg Oral QHS PRN Loletha Grayer, MD   100 mg at 10/05/18 2043  . [MAR Hold] umeclidinium bromide (INCRUSE ELLIPTA) 62.5 MCG/INH 1 puff  1 puff Inhalation Daily Loletha Grayer, MD   1 puff at 10/06/18 0740  . [MAR Hold] vancomycin (VANCOCIN) IVPB 750 mg/150 ml premix   750 mg Intravenous Q24H Rocky Morel, RPH 150 mL/hr at 10/06/18 0501 750 mg at 10/06/18 0501     Discharge Medications: Please see discharge summary for a list of discharge medications.  Relevant Imaging Results:  Relevant Lab Results:   Additional Information SSN: [id number]  Itzel Mckibbin, Veronia Beets, LCSW

## 2018-10-06 NOTE — TOC Progression Note (Signed)
Transition of Care (TOC) - Progression Note    Patient Details  Name: [name]. MRN: [id number] Date of Birth: [birth year]  Transition of Care [facility]) CM/SW Contact  Daiki Dicostanzo, Lenice Llamas Phone Number: [phone]  10/06/2018, 4:44 PM  Clinical Narrative: Clinical Social Worker (CSW) presented bed offers to patient's daughter Butch Penny and she said she would talk to patient about going to [facility]. CSW will continue to follow and assist as needed.     Expected Discharge Plan: Skilled Nursing Facility Barriers to Discharge: Continued Medical Work up  Expected Discharge Plan and Services Expected Discharge Plan: [place] In-house Referral: Clinical Social Work Discharge Planning Services: CM Consult   Living arrangements for the past 2 months: Single Family Home                                       Social Determinants of Health (SDOH) Interventions    Readmission Risk Interventions Readmission Risk Prevention Plan 09/16/2018  Transportation Screening Complete  Medication Review Press photographer) Complete  HRI or [place] Complete  SW Recovery Care/Counseling Consult Patient refused  Palliative Care Screening Patient [place] Patient Refused  Some recent data might be hidden

## 2018-10-06 NOTE — Anesthesia Post-op Follow-up Note (Signed)
Anesthesia QCDR form completed.        

## 2018-10-06 NOTE — Transfer of Care (Signed)
Immediate Anesthesia Transfer of Care Note  Patient: [name].  Procedure(s) Performed: AMPUTATION BELOW KNEE (Left Leg Lower)  Patient Location: PACU  Anesthesia Type:General  Level of Consciousness: awake and oriented  Airway & Oxygen Therapy: Patient Spontanous Breathing and Patient connected to face mask oxygen  Post-op Assessment: Report given to RN and Post -op Vital signs reviewed and stable  Post vital signs: Reviewed and stable  Last Vitals:  Vitals Value Taken Time  BP 125/84 10/06/18 1416  Temp    Pulse 81 10/06/18 1419  Resp 21 10/06/18 1419  SpO2 100 % 10/06/18 1419  Vitals shown include unvalidated device data.  Last Pain:  Vitals:   10/06/18 1416  TempSrc:   PainSc: (P) Asleep      Patients Stated Pain Goal: 0 (62/44/69 5072)  Complications: No apparent anesthesia complications

## 2018-10-06 NOTE — Progress Notes (Signed)
Initial Nutrition Assessment  DOCUMENTATION CODES:   Not applicable  INTERVENTION:   Ensure Max protein supplement BID, each supplement provides 150kcal and 30g of protein.  Ocuvite daily for wound healing (provides zinc, vitamin A, vitamin C, Vitamin E, copper, and selenium)  Vitamin C 250mg  po BID  Carbohydrate controlled diet   NUTRITION DIAGNOSIS:   Increased nutrient needs related to wound healing as evidenced by increased estimated needs  GOAL:   Patient will meet greater than or equal to 90% of their needs  MONITOR:   PO intake, Supplement acceptance, Labs, Weight trends, Skin, I & O's  REASON FOR ASSESSMENT:   Consult Wound healing  ASSESSMENT:   85 y/o male with h/o DM, CHF and MI admitted with L diabetic foot wound and now s/p L BKA 6/26  RD working remotely.  Unable to speak with pt as pt in surgery at time of RD visit. Pt eating 50% of meals prior to NPO. Pt with increased estimated needs r/t wound healing. RD will add supplements and vitamins to support wound healing. Per chart, pt fairly weight stable pta. Will try to provide diabetes diet education prior to discharge.   Medications reviewed and include: celexa, insulin, synthroid, KCl. prednisone, torsemide, NaCl @50ml /hr, cefepime, NaCl w/ 5% dextrose @75ml /hr, vancomycin   Labs reviewed: BUN 28(H), creat 1.34(H), Mg 2.1 wnl Wbc- 14.0(H) cbgs- 91, 63, 90 x 24 hrs AIC 7.4(H)- 6/1  Unable to complete Nutrition-Focused physical exam at this time.   Diet Order:   Diet Order            Diet NPO time specified  Diet effective midnight             EDUCATION NEEDS:   Not appropriate for education at this time  Skin:  Skin Assessment: Reviewed RN Assessment(incision L leg s/ BKA)  Last BM:  6/26- type 5  Height:   Ht Readings from Last 1 Encounters:  10/03/18 5\' 6"  (1.676 m)    Weight:   Wt Readings from Last 1 Encounters:  10/03/18 84.8 kg    Ideal Body Weight:  64.5 kg  BMI:   Body mass index is 30.17 kg/m.  Estimated Nutritional Needs:   Kcal:  1800-2100kcal/day  Protein:  90-105g/day  Fluid:  >1.6L/day  Koleen Distance MS, RD, LDN Pager #- [phone] Office#- [phone] After Hours Pager: [phone]

## 2018-10-06 NOTE — H&P (Signed)
[facility] History & Physical Update  The patient was interviewed and re-examined.  The patient's previous History and Physical has been reviewed and is unchanged.  There is no change in the plan of care. We plan to proceed with the scheduled procedure.  Leotis Pain, MD  10/06/2018, 12:31 PM

## 2018-10-06 NOTE — Anesthesia Procedure Notes (Signed)
Procedure Name: Intubation Date/Time: 10/06/2018 1:15 PM Performed by: Allean Found, CRNA Pre-anesthesia Checklist: Patient identified, Patient being monitored, Timeout performed, Emergency Drugs available and Suction available Patient Re-evaluated:Patient Re-evaluated prior to induction Oxygen Delivery Method: Circle system utilized Preoxygenation: Pre-oxygenation with 100% oxygen Induction Type: IV induction Ventilation: Mask ventilation without difficulty Laryngoscope Size: 3 and McGraph Grade View: Grade I Tube type: Oral Tube size: 7.0 mm Number of attempts: 1 Airway Equipment and Method: Stylet Placement Confirmation: ETT inserted through vocal cords under direct vision,  positive ETCO2 and breath sounds checked- equal and bilateral Secured at: 21 cm Tube secured with: Tape Dental Injury: Teeth and Oropharynx as per pre-operative assessment

## 2018-10-06 NOTE — Op Note (Signed)
OPERATIVE NOTE   PROCEDURE: Left below-the-knee amputation  PRE-OPERATIVE DIAGNOSIS: Left foot ulceration, infection, PAD  POST-OPERATIVE DIAGNOSIS: same as above  SURGEON: Leotis Pain, MD  ASSISTANT(S): Hezzie Bump, PA-C  ANESTHESIA: general  ESTIMATED BLOOD LOSS: 50 cc  FINDING(S): none  SPECIMEN(S):  Left below-the-knee amputation  INDICATIONS:   [name]. is a 85 y.o. male who presents with left leg foot ulceration and gangrenous changes with poor wound healing.  The patient is scheduled for a left below-the-knee amputation.  I discussed in depth with the patient the risks, benefits, and alternatives to this procedure.  The patient is aware that the risk of this operation included but are not limited to:  bleeding, infection, myocardial infarction, stroke, death, failure to heal amputation wound, and possible need for more proximal amputation.  The patient is aware of the risks and agrees proceed forward with the procedure. An assistant was present during the procedure to help facilitate the exposure and expedite the procedure.  DESCRIPTION:  After full informed written consent was obtained from the patient, the patient was brought back to the operating room, and placed supine upon the operating table.  Prior to induction, the patient received IV antibiotics.  The patient was then prepped and draped in the standard fashion for a below-the-knee amputation. The assistant provided retraction and mobilization to help facilitate exposure and expedite the procedure throughout the entire procedure.  This included following suture, using retractors, and optimizing lighting.  After obtaining adequate anesthesia, the patient was prepped and draped in the standard fashion for a left below-the-knee amputation.  I marked out the anterior incision two finger breadths below the tibial tuberosity and then the marked out a posterior flap that was one third of the circumference of the  calf in length.   I made the incisions for these flaps, and then dissected through the subcutaneous tissue, fascia, and muscle anteriorly.  I elevated  the periosteal tissue superiorly so that the tibia was about 3-4 cm shorter than the anterior skin flap.  I then transected the tibia with a power saw and then took a wedge off the tibia anteriorly with the power saw.  Then I smoothed out the rough edges.  In a similar fashion, I cut back the fibula about two centimeters higher than the level of the tibia with a bone cutter.  I put a bone hook into the distal tibia and then used a large amputation knife to sharply develop a tissue plane through the muscle along the fibula.  In such fashion, the posterior flap was developed.  At this point, the specimen was passed off the field as the below-the-knee amputation.  At this point, I clamped all visibly bleeding arteries and veins using a combination of suture ligation with Silk suture and electrocautery.  Bleeding continued to be controlled with electrocautery and suture ligature.  The stump was washed off with sterile normal saline and no further active bleeding was noted.  I reapproximated the anterior and posterior fascia  with interrupted stitches of 0 Vicryl.  This was completed along the entire length of anterior and posterior fascia until there were no more loose space in the fascial line. I then placed a layer of 2-0 Vicryl sutures in the subcutaneous tissue. The skin was then  reapproximated with staples.  The stump was washed off and dried.  The incision was dressed with Xeroform and  then fluffs were applied.  Kerlix was wrapped around the leg and then gently  an ACE wrap was applied.    COMPLICATIONS: none  CONDITION: stable   Leotis Pain  10/06/2018, 1:57 PM    This note was created with Dragon Medical transcription system. Any errors in dictation are purely unintentional.

## 2018-10-06 NOTE — Anesthesia Postprocedure Evaluation (Signed)
Anesthesia Post Note  Patient: [name].  Procedure(s) Performed: AMPUTATION BELOW KNEE (Left Leg Lower)  Patient location during evaluation: PACU Anesthesia Type: General Level of consciousness: awake and alert Pain management: pain level controlled Vital Signs Assessment: post-procedure vital signs reviewed and stable Respiratory status: spontaneous breathing, nonlabored ventilation, respiratory function stable and patient connected to nasal cannula oxygen Cardiovascular status: blood pressure returned to baseline and stable Postop Assessment: no apparent nausea or vomiting Anesthetic complications: no     Last Vitals:  Vitals:   10/06/18 1629 10/06/18 1750  BP: 129/69 100/71  Pulse: 63 83  Resp: 16 17  Temp: 36.7 C 36.8 C  SpO2: 93% 96%    Last Pain:  Vitals:   10/06/18 1712  TempSrc:   PainSc: [place]

## 2018-10-06 NOTE — Consult Note (Signed)
ANTICOAGULATION CONSULT NOTE - Follow Up Consult  Pharmacy Consult for Heparin gtt Indication: atrial fibrillation  Allergies  Allergen Reactions  . Contrast Media [Iodinated Diagnostic Agents] Shortness Of Breath  . Morphine And Related Other (See Comments)    Hallucinations   . Niacin And Related Dermatitis  . Other     Other reaction(s): SHORTNESS OF BREATH  . Amlodipine Rash    Patient Measurements: Height: 5\' 6"  (167.6 cm) Weight: 186 lb 15.2 oz (84.8 kg) IBW/kg (Calculated) : 63.8 Heparin Dosing Weight: 81.3 kg   Vital Signs: Temp: 97.7 F (36.5 C) (06/26 0022) Temp Source: Oral (06/26 0022) BP: 135/74 (06/26 0022) Pulse Rate: 76 (06/26 0022)  Labs: Recent Labs    10/03/18 1918 10/04/18 0347 10/04/18 0749 10/04/18 0800 10/04/18 1741 10/05/18 0201 10/06/18 0504  HGB 11.6* 10.7*  --   --   --  10.8* 11.6*  HCT 37.3* 35.1*  --   --   --  35.1* 37.7*  PLT 313 265  --   --   --  281 313  APTT 33  --  49*  --  67* 77* 80*  LABPROT 15.6*  --   --  15.7*  --   --  14.8  INR 1.3*  --   --  1.3*  --   --  1.2  HEPARINUNFRC 1.04*  --  0.64  --   --  0.59 0.53  CREATININE 1.68* 1.44*  --   --   --  1.35* 1.34*    Estimated Creatinine Clearance: 41.9 mL/min (A) (by C-G formula based on SCr of 1.34 mg/dL (H)).   Medications:  PTA Eliquis - last dose taken 6/23 ~ 9 am   Assessment: Patient has a PMH significant for atrial fibrillation. Patient was switched to heparin in anticipation of amputation.  If HL < 0.1, use only HLs to titrate heparin infusion. If HL > 0.1 and does not correlate with aPTT, then will use aPTT levels.  6/23: Started heparin infusion at 1100 units/hr  6/24 0749: HL 0.64, aPTT 49 (subtherapeutic, not yet correlating). Heparin bolus 1200 units IV x1 and increased heparin drip to 1250 units/hr (=12.5 ml/hr)  6/24 APTT @1814 : 67. Therapeutic x1 on the infusion rate of 1250 units/hr (=12.5 ml/hr). Moving forward, it is appropriate to use HL. No  interruptions noted.   6/25 @ 0201 aPTT: 77, HL: 0.59. Levels are both therapeutic.    Goal of Therapy:  aPTT 66-102 seconds corresponding to Heparin level 0.3-0.7 units/ml. Monitor platelets by anticoagulation protocol: Yes   Plan:  6/26 @ 0504 aPTT: 80, HL: 0.53. Levels remain therapeutic.  Will continue heparin infusion at 1250 units/hr.  Continue to monitor HL and CBC daily, per protocol Continue to monitor H&H and platelets  Pernell Dupre, PharmD, BCPS Clinical Pharmacist 10/06/2018 5:34 AM

## 2018-10-06 NOTE — Progress Notes (Signed)
[place] [facility] [place] NAME: [name]    MR#:  [id number]  DATE OF BIRTH:  July 29, 1931  SUBJECTIVE:  No acute issues overnight   REVIEW OF SYSTEMS:    Review of Systems  Constitutional: Negative for fever, chills weight loss HENT: Negative for ear pain, nosebleeds, congestion, facial swelling, rhinorrhea, neck pain, neck stiffness and ear discharge.   Respiratory: Negative for cough, shortness of breath, wheezing  Cardiovascular: Negative for chest pain, palpitations and leg swelling.  Gastrointestinal: Negative for heartburn, abdominal pain, vomiting, diarrhea or consitpation Genitourinary: Negative for dysuria, urgency, frequency, hematuria Musculoskeletal: Negative for back pain or joint pain Neurological: Negative for dizziness, seizures, syncope, focal weakness,  numbness and headaches.  Hematological: Does not bruise/bleed easily.  Psychiatric/Behavioral: Negative for hallucinations, confusion, dysphoric mood SKIN  Redness left lower leg/foot previous 1st ray amputation    Tolerating Diet: N.p.o.     DRUG ALLERGIES:   Allergies  Allergen Reactions  . Contrast Media [Iodinated Diagnostic Agents] Shortness Of Breath  . Morphine And Related Other (See Comments)    Hallucinations   . Niacin And Related Dermatitis  . Other     Other reaction(s): SHORTNESS OF BREATH  . Amlodipine Rash    VITALS:  Blood pressure 110/68, pulse 76, temperature (!) 97.5 F (36.4 C), temperature source Oral, resp. rate 16, height 5\' 6"  (1.676 m), weight 84.8 kg, SpO2 98 %.  PHYSICAL EXAMINATION:  Constitutional: Appears well-developed and well-nourished. No distress. HENT: Normocephalic. Marland Kitchen Oropharynx is clear and moist.  Eyes: Conjunctivae and EOM are normal. PERRLA, no scleral icterus.  Neck: Normal ROM. Neck supple. No JVD. No tracheal deviation. CVS: RRR, S1/S2 +, no murmurs, no gallops, no carotid bruit.  Pulmonary: Effort and breath  sounds normal, no stridor, rhonchi, wheezes, rales.  Abdominal: Soft. BS +,  no distension, tenderness, rebound or guarding.  Musculoskeletal: Normal range of motion. No edema and no tenderness.  Neuro: Alert. CN 2-12 grossly intact. No focal deficits. Skin: Leg is currently dressed  Exposed first metatarsal bone which is necrotic and large necrotic area medial foot previous amputation site    . Psychiatric: Normal mood and affect.      LABORATORY PANEL:   CBC Recent Labs  Lab 10/06/18 0504  WBC 14.0*  HGB 11.6*  HCT 37.7*  PLT 313   ------------------------------------------------------------------------------------------------------------------  Chemistries  Recent Labs  Lab 10/06/18 0504  NA 141  K 4.9  CL 106  CO2 26  GLUCOSE 109*  BUN 28*  CREATININE 1.34*  CALCIUM 9.3  MG 2.1   ------------------------------------------------------------------------------------------------------------------  Cardiac Enzymes No results for input(s): TROPONINI in the last 168 hours. ------------------------------------------------------------------------------------------------------------------  RADIOLOGY:  No results found.   ASSESSMENT AND PLAN:   85 year old male with history of diabetes and first toe amputation with recent stent and angioplasty who presented to the hospital after being evaluated by home health nurse with concerns of incision necrosis.   1.  Diabetic left leg and foot infection with recent first ray amputation: Patient to go today for below-knee amputation Continue cefepime and vancomycin.  2.  Diabetes: Continue Levemir, NovoLog, sliding scale and ADA diet DM nurse following.  3.  Hypothyroidism: Continue Synthroid  4.  Essential hypertension: Continue metoprolol  5.  BPH: Continue Proscar and tamsulosin  6.  Glaucoma: Continue eyedrops  7.  Chronic atrial fibrillation: Currently in normal sinus rhythm. Off of Eliquis for now due to  surgery.  Continue heparin drip. Restart Eliquis after  procedure when cleared by Dr Lucky Cowboy   Management plans discussed with the patient and daughter and they are in agreement.  CODE STATUS: FULL  TOTAL TIME TAKING CARE OF THIS PATIENT: 26 minutes.     POSSIBLE D/C 3 days, DEPENDING ON CLINICAL CONDITION.   Bettey Costa M.D on 10/06/2018 at 11:08 AM  Between 7am to 6pm - Pager - [phone] After 6pm go to [email or web address] - password [email or web address] [facility]  Office  [phone]  CC: Primary care physician; Jearld Fenton, NP  Note: This dictation was prepared with Dragon dictation along with smaller phrase technology. Any transcriptional errors that result from this process are unintentional.

## 2018-10-07 ENCOUNTER — Encounter: Payer: Self-pay | Admitting: Vascular Surgery

## 2018-10-07 LAB — BASIC METABOLIC PANEL
Anion gap: 7 (ref 5–15)
BUN: 22 mg/dL (ref 8–23)
CO2: 25 mmol/L (ref 22–32)
Calcium: 9.1 mg/dL (ref 8.9–10.3)
Chloride: 108 mmol/L (ref 98–111)
Creatinine, Ser: 1.08 mg/dL (ref 0.61–1.24)
GFR calc Af Amer: >60 mL/min (ref >60)
GFR calc non Af Amer: >60 mL/min (ref >60)
Glucose, Bld: 77 mg/dL (ref 70–99)
Potassium: 3.8 mmol/L (ref 3.5–5.1)
Sodium: 140 mmol/L (ref 135–145)

## 2018-10-07 LAB — CBC
HCT: 36.2 % — ABNORMAL LOW (ref 39.0–52.0)
Hemoglobin: 11.3 g/dL — ABNORMAL LOW (ref 13.0–17.0)
MCH: 29.1 pg (ref 26.0–34.0)
MCHC: 31.2 g/dL (ref 30.0–36.0)
MCV: 93.3 fL (ref 80.0–100.0)
Platelets: 329 10*3/uL (ref 150–400)
RBC: 3.88 MIL/uL — ABNORMAL LOW (ref 4.22–5.81)
RDW: 17.2 % — ABNORMAL HIGH (ref 11.5–15.5)
WBC: 12.4 10*3/uL — ABNORMAL HIGH (ref 4.0–10.5)
nRBC: 0.2 % (ref 0.0–0.2)

## 2018-10-07 LAB — BPAM RBC
Blood Product Expiration Date: 202007182359
Blood Product Expiration Date: 202007182359
Unit Type and Rh: 6200
Unit Type and Rh: 6200

## 2018-10-07 LAB — TYPE AND SCREEN
ABO/RH(D): A POS
Antibody Screen: NEGATIVE
Unit division: 0
Unit division: 0

## 2018-10-07 LAB — GLUCOSE, CAPILLARY
Glucose-Capillary: 47 mg/dL — ABNORMAL LOW (ref 70–99)
Glucose-Capillary: 54 mg/dL — ABNORMAL LOW (ref 70–99)
Glucose-Capillary: 85 mg/dL (ref 70–99)
Glucose-Capillary: 159 mg/dL — ABNORMAL HIGH (ref 70–99)
Glucose-Capillary: 226 mg/dL — ABNORMAL HIGH (ref 70–99)
Glucose-Capillary: 291 mg/dL — ABNORMAL HIGH (ref 70–99)

## 2018-10-07 LAB — PREPARE RBC (CROSSMATCH)

## 2018-10-07 MED ORDER — DOCUSATE SODIUM 100 MG PO CAPS
100.0000 mg | ORAL_CAPSULE | Freq: Two times a day (BID) | ORAL | Status: DC
  Administered 2018-10-07 – 2018-10-13 (×12): 100 mg via ORAL
  Filled 2018-10-07 (×13): qty 1

## 2018-10-07 MED ORDER — INSULIN DETEMIR 100 UNIT/ML ~~LOC~~ SOLN
27.0000 [IU] | Freq: Every day | SUBCUTANEOUS | Status: DC
  Administered 2018-10-07 – 2018-10-09 (×3): 27 [IU] via SUBCUTANEOUS
  Filled 2018-10-07 (×3): qty 0.27

## 2018-10-07 MED ORDER — POLYETHYLENE GLYCOL 3350 17 G PO PACK
17.0000 g | PACK | Freq: Every day | ORAL | Status: DC
  Administered 2018-10-07 – 2018-10-11 (×5): 17 g via ORAL
  Filled 2018-10-07 (×5): qty 1

## 2018-10-07 NOTE — Progress Notes (Signed)
[place] [facility] [place] NAME: [name]    MR#:  [id number]   DATE OF BIRTH:  1931/07/06  SUBJECTIVE:   Patient receiving IV morphine for pain.  Daughter is at bedside.   REVIEW OF SYSTEMS:    Review of Systems  Constitutional: Negative for fever, chills weight loss HENT: Negative for ear pain, nosebleeds, congestion, facial swelling, rhinorrhea, neck pain, neck stiffness and ear discharge.   Respiratory: Negative for cough, shortness of breath, wheezing  Cardiovascular: Negative for chest pain, palpitations and leg swelling.  Gastrointestinal: Negative for heartburn, abdominal pain, vomiting, diarrhea or consitpation Genitourinary: Negative for dysuria, urgency, frequency, hematuria Musculoskeletal: Negative for back pain  BKA Neurological: Negative for dizziness, seizures, syncope, focal weakness,  numbness and headaches.  Hematological: Does not bruise/bleed easily.  Psychiatric/Behavioral: Negative for hallucinations, confusion, dysphoric mood Tolerating Diet: yes     DRUG ALLERGIES:   Allergies  Allergen Reactions  . Contrast Media [Iodinated Diagnostic Agents] Shortness Of Breath  . Morphine And Related Other (See Comments)    Hallucinations   . Niacin And Related Dermatitis  . Other     Other reaction(s): SHORTNESS OF BREATH  . Amlodipine Rash    VITALS:  Blood pressure 140/62, pulse 83, temperature 98.5 F (36.9 C), temperature source Oral, resp. rate 16, height 5\' 6"  (1.676 m), weight 84.8 kg, SpO2 95 %.  PHYSICAL EXAMINATION:  Constitutional: Appears well-developed and well-nourished. No distress. HENT: Normocephalic. Marland Kitchen Oropharynx is clear and moist.  Eyes: Conjunctivae and EOM are normal. PERRLA, no scleral icterus.  Neck: Normal ROM. Neck supple. No JVD. No tracheal deviation. CVS: RRR, S1/S2 +, no murmurs, no gallops, no carotid bruit.  Pulmonary: Effort and breath sounds normal, no stridor, rhonchi,  wheezes, rales.  Abdominal: Soft. BS +,  no distension, tenderness, rebound or guarding.  Musculoskeletal: Normal range of motion. No edema and no tenderness.  Neuro: Alert. CN 2-12 grossly intact. No focal deficits. Skin: Left BKA . Dressing without much discharge   psychiatric: Normal mood and affect.      LABORATORY PANEL:   CBC Recent Labs  Lab 10/07/18 0647  WBC 12.4*  HGB 11.3*  HCT 36.2*  PLT 329   ------------------------------------------------------------------------------------------------------------------  Chemistries  Recent Labs  Lab 10/06/18 0504 10/07/18 0647  NA 141 140  K 4.9 3.8  CL 106 108  CO2 26 25  GLUCOSE 109* 77  BUN 28* 22  CREATININE 1.34* 1.08  CALCIUM 9.3 9.1  MG 2.1  --    ------------------------------------------------------------------------------------------------------------------  Cardiac Enzymes No results for input(s): TROPONINI in the last 168 hours. ------------------------------------------------------------------------------------------------------------------  RADIOLOGY:  No results found.   ASSESSMENT AND PLAN:   85 year old male with history of diabetes and first toe amputation with recent stent and angioplasty who presented to the hospital after being evaluated by home health nurse with concerns of incision necrosis.   1.  Diabetic left leg and foot infection with recent first ray amputation: Patient is postoperative day #1 left below-knee amputation by vascular surgery. Continue cefepime and vancomycin. Okay to stop antibiotics. Continue pain control PT consult for discharge planning. Add stool softeners  2.  Diabetes: Continue Levemir (decreased dose due to low BS this am) Continue NovoLog, sliding scale and ADA diet DM nurse following. 3.  Hypothyroidism: Continue Synthroid  4.  Essential hypertension: Continue metoprolol Watch blood pressure while on IV morphine.   5.  BPH: Continue Proscar and  tamsulosin  6.  Glaucoma: Continue eyedrops  7.  Chronic atrial fibrillation: Currently in normal sinus rhythm. Continue Eliquis. Management plans discussed with the patient and daughter and they are in agreement.  CODE STATUS: FULL  TOTAL TIME TAKING CARE OF THIS PATIENT: 26 minutes.     POSSIBLE D/C 3 days, DEPENDING ON CLINICAL CONDITION.   Bettey Costa M.D on 10/07/2018 at 9:45 AM  Between 7am to 6pm - Pager - [phone] After 6pm go to [email or web address] - password [email or web address] [facility]  Office  [phone]  CC: Primary care physician; Jearld Fenton, NP  Note: This dictation was prepared with Dragon dictation along with smaller phrase technology. Any transcriptional errors that result from this process are unintentional.

## 2018-10-07 NOTE — Progress Notes (Signed)
Subjective  - POD #1, s/p left BKA  C/o pain at stump.  Not having any significant phantom pain Was too sleepy to work a lot with PT   Physical Exam:  Dressing dry       Assessment/Plan:  POD #1  Continue PT/OT Will change dressing early next week  [name] 10/07/2018 1:37 PM --  Vitals:   10/06/18 2316 10/07/18 0818  BP: 126/72 140/62  Pulse: 80 83  Resp: 18 16  Temp: 97.8 F (36.6 C) 98.5 F (36.9 C)  SpO2: 98% 95%    Intake/Output Summary (Last 24 hours) at 10/07/2018 1337 Last data filed at 10/07/2018 1243 Gross per 24 hour  Intake 1323.06 ml  Output 1275 ml  Net 48.06 ml     Laboratory CBC    Component Value Date/Time   WBC 12.4 (H) 10/07/2018 0647   HGB 11.3 (L) 10/07/2018 0647   HGB 13.3 08/05/2014 1839   HCT 36.2 (L) 10/07/2018 0647   HCT 40.1 08/05/2014 1839   PLT 329 10/07/2018 0647   PLT 246 08/05/2014 1839    BMET    Component Value Date/Time   NA 140 10/07/2018 0647   NA 143 02/21/2015 1108   NA 137 08/05/2014 1839   K 3.8 10/07/2018 0647   K 3.6 08/05/2014 1839   CL 108 10/07/2018 0647   CL 106 08/05/2014 1839   CO2 25 10/07/2018 0647   CO2 23 08/05/2014 1839   GLUCOSE 77 10/07/2018 0647   GLUCOSE 235 (H) 08/05/2014 1839   BUN 22 10/07/2018 0647   BUN 26 02/21/2015 1108   BUN 19 08/05/2014 1839   CREATININE 1.08 10/07/2018 0647   CREATININE 1.20 08/05/2014 1839   CALCIUM 9.1 10/07/2018 0647   CALCIUM 8.6 (L) 08/05/2014 1839   GFRNONAA >60 10/07/2018 0647   GFRNONAA 57 (L) 08/05/2014 1839   GFRAA >60 10/07/2018 0647   GFRAA >60 08/05/2014 1839    COAG Lab Results  Component Value Date   INR 1.2 10/06/2018   INR 1.3 (H) 10/04/2018   INR 1.3 (H) 10/03/2018   No results found for: PTT  Antibiotics Anti-infectives (From admission, onward)   Start     Dose/Rate Route Frequency Ordered Stop   10/06/18 1203  ceFAZolin (ANCEF) 2-4 GM/100ML-% IVPB    Note to Pharmacy: Norton Blizzard  : cabinet override       10/06/18 1203 10/06/18 1247   10/06/18 0000  ceFAZolin (ANCEF) IVPB 2g/100 mL premix    Note to Pharmacy: Please send with patient to OR   2 g 200 mL/hr over 30 Minutes Intravenous On call 10/05/18 1039 10/06/18 1314   10/05/18 2200  vancomycin (VANCOCIN) 1,250 mg in sodium chloride 0.9 % 250 mL IVPB  Status:  Discontinued     1,250 mg 166.7 mL/hr over 90 Minutes Intravenous Every 48 hours 10/03/18 2009 10/04/18 0915   10/05/18 0600  vancomycin (VANCOCIN) IVPB 750 mg/150 ml premix  Status:  Discontinued     750 mg 150 mL/hr over 60 Minutes Intravenous Every 24 hours 10/04/18 0915 10/07/18 0948   10/03/18 2000  vancomycin (VANCOCIN) 2,000 mg in sodium chloride 0.9 % 500 mL IVPB     2,000 mg 250 mL/hr over 120 Minutes Intravenous  Once 10/03/18 1942 10/04/18 0214   10/03/18 2000  ceFEPIme (MAXIPIME) 2 g in sodium chloride 0.9 % 100 mL IVPB  Status:  Discontinued     2 g 200 mL/hr over 30 Minutes Intravenous Every  12 hours 10/03/18 1942 10/07/18 0948       V. [name], M.D., [facility] [place] Office: [phone] Pager:  [phone]

## 2018-10-07 NOTE — Progress Notes (Signed)
PT Cancellation Note  Patient Details Name: [name]. MRN: [id number] DOB: Apr 28, 1931   Cancelled Treatment:    Reason Eval/Treat Not Completed: Fatigue/lethargy limiting ability to participate(Consult received and chart reviewed. Patient sleeping soundly upon arrival. Opens eyes briefly to max verbal/tactile cuing, but unable to maintain for adequate participation with session.  Will re-attempt in PM as alertness improved.)   Indio Santilli H. Owens Shark, PT, DPT, NCS 10/07/18, 10:06 AM [phone]

## 2018-10-07 NOTE — Evaluation (Signed)
Physical Therapy Evaluation Patient Details Name: [name]. MRN: [id number] DOB: January 10, 1932 Today's Date: 10/07/2018   History of Present Illness  admitted for acute hospitalization status post L BKA (10/06/18)  Clinical Impression  Patient more alert this PM, eating lunch upon arrival to room (daughter at bedside feeding patient).  Agreeable to session as able.  Intermittent confusion noted during session; frequent cuing for attention to and recall of task at hand (even within a single set of 10 isolated, repetitive therex).  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  Currently requiring dep/total assist for repositioning, scooting up in bed during session; declined attempts at OOB due to feeling "woozy" from pain medications.  Will continue to assess/progress as appropriate; do anticipate extensive assist required for all functional activities and OOB attempts. Of note, mild SOB with exertion; sats >92-93% on RA throughout session.  Would benefit from skilled PT to address above deficits and promote optimal return to PLOF; recommend transition to STR upon discharge from acute hospitalization.  *Patient does have protective footwear/shoe for use on R LE as needed (present in hospital room).    Follow Up Recommendations SNF    Equipment Recommendations       Recommendations for Other Services       Precautions / Restrictions Precautions Precautions: Fall Restrictions Weight Bearing Restrictions: No      Mobility  Bed Mobility Overal bed mobility: Needs Assistance Bed Mobility: (scooting/repositioning in bed)           General bed mobility comments: dep assist for repositioning in bed  Transfers                 General transfer comment: patient declined attempts at [place] due to feeling "woozy" after medications  Ambulation/Gait             General Gait Details: patient declined attempts at OOB due to feeling "woozy" after medications  Stairs            Wheelchair Mobility    Modified Rankin (Stroke Patients Only)       Balance       Sitting balance - Comments: patient declined attempts at [place] due to feeling "woozy" after medications                                     Pertinent Vitals/Pain Pain Assessment: Faces Faces Pain Scale: Hurts little more Pain Location: L LE stump Pain Descriptors / Indicators: Aching;Grimacing Pain Intervention(s): Limited activity within patient's tolerance;Monitored during session;Premedicated before session;Repositioned    Home Living Family/patient expects to be discharged to:: Private residence Living Arrangements: Spouse/significant other Available Help at Discharge: Family Type of Home: House Home Access: Ramped entrance Entrance Stairs-Rails: Right;Left;Can reach both Entrance Stairs-Number of Steps: 2 Home Layout: One level Home Equipment: Walker - 2 wheels;Walker - 4 wheels;Cane - single point;Bedside commode;Shower seat;Shower seat - built in;Grab bars - toilet;Grab bars - tub/shower;Wheelchair - manual;Hospital bed;Other (comment)      Prior Function Level of Independence: Needs assistance   Gait / Transfers Assistance Needed: Pt ambulates in home with rollator vs front wheeled walker. He reports at least 6 falls in the last 12 months.  ADL's / Homemaking Assistance Needed: Pt states that he requires assistance with ADLs/IADLs from wife and niece/dtr  Hand Dominance        Extremity/Trunk Assessment   Upper Extremity Assessment Upper Extremity Assessment: (R UE grossly 4-/5; L shoulder elevation 3-/5, elbow, wrist 3-/5)    Lower Extremity Assessment Lower Extremity Assessment: (R LE grossly 4-/5, L LE grossly 3-/5; L knee lacking approx 10 degrees extension)       Communication      Cognition Arousal/Alertness:  Lethargic Behavior During Therapy: WFL for tasks assessed/performed                                   General Comments: intermittently confused; difficulty following maintaining alertness/attention to task at times (?medication-related?)      General Comments      Exercises Other Exercises Other Exercises: Supine LE therex, 1x10, act assist ROM: ankle pumps, quad sets, heel slides to R LE; quad sets, hip/knee flexion (emphasis on end-range ROM) and hip abduct/adduct L LE.  Educated on role of L knee extension and positioning; patient verbalized understanding of information, will reinforce with patient/team as appropriate.   Assessment/Plan    PT Assessment Patient needs continued PT services  PT Problem List Decreased strength;Decreased range of motion;Decreased activity tolerance;Decreased balance;Decreased mobility;Decreased coordination;Decreased knowledge of use of DME;Decreased knowledge of precautions;Cardiopulmonary status limiting activity;Decreased skin integrity;Pain       PT Treatment Interventions DME instruction;Gait training;Stair training;Therapeutic activities;Balance training;Therapeutic exercise;Neuromuscular re-education;Patient/family education;Functional mobility training;Cognitive remediation    PT Goals (Current goals can be found in the Care Plan section)  Acute Rehab PT Goals Patient Stated Goal: agreeable to session PT Goal Formulation: With patient Time For Goal Achievement: 10/21/18 Potential to Achieve Goals: Fair Additional Goals Additional Goal #1: Assess and establish goals for gait training as appropriate.    Frequency 7X/week   Barriers to discharge Decreased caregiver support      Co-evaluation               AM-PAC PT "6 Clicks" Mobility  Outcome Measure Help needed turning from your back to your side while in a flat bed without using bedrails?: A Lot   Help needed moving to and from a bed to a chair (including a  wheelchair)?: Total Help needed standing up from a chair using your arms (e.g., wheelchair or bedside chair)?: Total Help needed to walk in hospital room?: Total Help needed climbing 3-5 steps with a railing? : Total 6 Click Score: 6    End of Session   Activity Tolerance: Patient limited by fatigue Patient left: in bed;with bed alarm set;with call bell/phone within reach Nurse Communication: Mobility status PT Visit Diagnosis: Unsteadiness on feet (R26.81);Repeated falls (R29.6);History of falling (Z91.81)    Time: 1975-8832 PT Time Calculation (min) (ACUTE ONLY): 17 min   Charges:   PT Evaluation $PT Eval Moderate Complexity: 1 Mod PT Treatments $Therapeutic Exercise: 8-22 mins        Finnlee Guarnieri H. Owens Shark, PT, DPT, NCS 10/07/18, 1:52 PM [phone]

## 2018-10-07 NOTE — Progress Notes (Signed)
OT Cancellation Note  Patient Details Name: [name]. MRN: [id number] DOB: 05/16/1931   Cancelled Evaluation:    Reason Eval/Treat Not Completed: Fatigue/lethargy limiting ability to participate. Thank you for the OT consult. Upon arrival to pt room, pt asleep in bed with daughter at bedside. Pt rouses to VC's but remains lethargic. Unable to participate in OT eval at this time. Will re-attempt at a later time/date as available and pt medically appropriate for OT Evaluation.   Shara Blazing, M.S., OTR/L Ascom: [phone] 10/07/18, 3:04 PM

## 2018-10-07 NOTE — Progress Notes (Signed)
Inpatient Diabetes Program Recommendations  AACE/ADA: New Consensus Statement on Inpatient Glycemic Control  Target Ranges:  Prepandial:   less than 140 mg/dL      Peak postprandial:   less than 180 mg/dL (1-2 hours)      Critically ill patients:  140 - 180 mg/dL   Results for TAGGART, PRASAD" (MRN [id number]) as of 10/07/2018 07:24  Ref. Range 10/06/2018 07:52 10/06/2018 11:40 10/06/2018 12:34 10/06/2018 14:18 10/06/2018 16:25 10/06/2018 21:18  Glucose-Capillary Latest Ref Range: 70 - 99 mg/dL 91 63 (L) 90 236 (H) 214 (H) 142 (H)   Review of Glycemic Control  Diabetes history: DM2 Outpatient Diabetes medications: Levemir 42 units QHS, Humalog 18 units with breakfast, Humalog 3 units with lunch, Humalog 10 units with supper Current orders for Inpatient glycemic control:Levemir30units QHS, Novolog 10 units TID with meals, Novolog 0-9 units TID with meals, Novolog 0-5 units QHS; Prednisone 10 mg QAM  Inpatient Diabetes Program Recommendations:   Insulin-Basal: Fasting glucose 77 mg/dl today. Please consider decreasing Levemir to 27 units QHS.  Insulin - Meal Coverage: Noted patient did not eat any meals yesterday (was NPO for surgery and then per chart, did not eat supper either after surgery).   NOTE: In reviewing chart, noted patient did not eat at all on 10/06/18 and Prednisone 10 mg was not given on 10/06/18. Noted glucose down to 63 mg/dl at 11:40 mg/dl on 10/06/18.   Thanks, Barnie Alderman, RN, MSN, CDE Diabetes Coordinator Inpatient Diabetes Program [phone] (Team Pager from 8am to 5pm)

## 2018-10-08 LAB — CBC
HCT: 36.1 % — ABNORMAL LOW (ref 39.0–52.0)
Hemoglobin: 11.2 g/dL — ABNORMAL LOW (ref 13.0–17.0)
MCH: 28.4 pg (ref 26.0–34.0)
MCHC: 31 g/dL (ref 30.0–36.0)
MCV: 91.4 fL (ref 80.0–100.0)
Platelets: 322 10*3/uL (ref 150–400)
RBC: 3.95 MIL/uL — ABNORMAL LOW (ref 4.22–5.81)
RDW: 17.4 % — ABNORMAL HIGH (ref 11.5–15.5)
WBC: 11.2 10*3/uL — ABNORMAL HIGH (ref 4.0–10.5)
nRBC: 0 % (ref 0.0–0.2)

## 2018-10-08 LAB — GLUCOSE, CAPILLARY
Glucose-Capillary: 185 mg/dL — ABNORMAL HIGH (ref 70–99)
Glucose-Capillary: 321 mg/dL — ABNORMAL HIGH (ref 70–99)
Glucose-Capillary: 289 mg/dL — ABNORMAL HIGH (ref 70–99)
Glucose-Capillary: 385 mg/dL — ABNORMAL HIGH (ref 70–99)

## 2018-10-08 MED ORDER — HYDROCODONE-ACETAMINOPHEN 5-325 MG PO TABS
1.0000 | ORAL_TABLET | ORAL | Status: DC | PRN
  Administered 2018-10-08: 2 via ORAL
  Administered 2018-10-08: 1 via ORAL
  Administered 2018-10-09 – 2018-10-13 (×11): 2 via ORAL
  Filled 2018-10-08 (×3): qty 2
  Filled 2018-10-08: qty 1
  Filled 2018-10-08 (×10): qty 2

## 2018-10-08 MED ORDER — FLEET ENEMA 7-19 GM/118ML RE ENEM: 1.0000 | ENEMA | Freq: Every day | RECTAL | Status: DC | PRN

## 2018-10-08 MED ORDER — INSULIN ASPART 100 UNIT/ML ~~LOC~~ SOLN
0.0000 [IU] | Freq: Three times a day (TID) | SUBCUTANEOUS | Status: DC
  Administered 2018-10-08: 8 [IU] via SUBCUTANEOUS
  Administered 2018-10-08: 12:00:00 11 [IU] via SUBCUTANEOUS
  Administered 2018-10-09: 3 [IU] via SUBCUTANEOUS
  Administered 2018-10-09 (×2): 8 [IU] via SUBCUTANEOUS
  Administered 2018-10-10: 3 [IU] via SUBCUTANEOUS
  Administered 2018-10-10: 8 [IU] via SUBCUTANEOUS
  Administered 2018-10-11 (×2): 2 [IU] via SUBCUTANEOUS
  Administered 2018-10-11: 11 [IU] via SUBCUTANEOUS
  Administered 2018-10-12: 2 [IU] via SUBCUTANEOUS
  Administered 2018-10-12: 3 [IU] via SUBCUTANEOUS
  Administered 2018-10-13: 2 [IU] via SUBCUTANEOUS
  Administered 2018-10-13: 3 [IU] via SUBCUTANEOUS
  Filled 2018-10-08 (×14): qty 1

## 2018-10-08 MED ORDER — BISACODYL 10 MG RE SUPP
10.0000 mg | Freq: Every day | RECTAL | Status: DC
  Administered 2018-10-08 – 2018-10-12 (×5): 10 mg via RECTAL
  Filled 2018-10-08 (×5): qty 1

## 2018-10-08 NOTE — Progress Notes (Signed)
[facility] [place] NAME: [name]    MR#:  [id number]   DATE OF BIRTH:  [birth year]  SUBJECTIVE:   Daughter is at bedside and is concerned that patient is not receiving IV morphine.   Nurse hesitant to give patient morphine while patient is sleeping and is not complaining of pain.  This was discussed with the daughter.  Patient with 2 out of 10 pain this morning.  No other issues reported. REVIEW OF SYSTEMS:    Review of Systems  Constitutional: Negative for fever, chills weight loss HENT: Negative for ear pain, nosebleeds, congestion, facial swelling, rhinorrhea, neck pain, neck stiffness and ear discharge.   Respiratory: Negative for cough, shortness of breath, wheezing  Cardiovascular: Negative for chest pain, palpitations and leg swelling.  Gastrointestinal: Negative for heartburn, abdominal pain, vomiting, diarrhea or consitpation Genitourinary: Negative for dysuria, urgency, frequency, hematuria Musculoskeletal: Negative for back pain  ++BKA stump pain Neurological: Negative for dizziness, seizures, syncope, focal weakness,  numbness and headaches.  Hematological: Does not bruise/bleed easily.  Psychiatric/Behavioral: Negative for hallucinations, confusion, dysphoric mood Tolerating Diet: yes     DRUG ALLERGIES:   Allergies  Allergen Reactions  . Contrast Media [Iodinated Diagnostic Agents] Shortness Of Breath  . Morphine And Related Other (See Comments)    Hallucinations   . Niacin And Related Dermatitis  . Other     Other reaction(s): SHORTNESS OF BREATH  . Amlodipine Rash    VITALS:  Blood pressure 122/64, pulse 72, temperature 97.9 F (36.6 C), temperature source Oral, resp. rate 18, height 5\' 6"  (1.676 m), weight 84.8 kg, SpO2 97 %.  PHYSICAL EXAMINATION:  Constitutional: Appears well-developed and well-nourished. No distress. HENT: Normocephalic. Marland Kitchen Oropharynx is clear and moist.  Eyes: Conjunctivae and  EOM are normal. PERRLA, no scleral icterus.  Neck: Normal ROM. Neck supple. No JVD. No tracheal deviation. CVS: RRR, S1/S2 +, no murmurs, no gallops, no carotid bruit.  Pulmonary: Effort and breath sounds normal, no stridor, rhonchi, wheezes, rales.  Abdominal: Soft. BS +,  no distension, tenderness, rebound or guarding.  Musculoskeletal: Normal range of motion. No edema and no tenderness.  Neuro: Alert. CN 2-12 grossly intact. No focal deficits. Skin: Left BKA dressed Dressing without much discharge   psychiatric: Normal mood and affect.      LABORATORY PANEL:   CBC Recent Labs  Lab 10/08/18 0705  WBC 11.2*  HGB 11.2*  HCT 36.1*  PLT 322   ------------------------------------------------------------------------------------------------------------------  Chemistries  Recent Labs  Lab 10/06/18 0504 10/07/18 0647  NA 141 140  K 4.9 3.8  CL 106 108  CO2 26 25  GLUCOSE 109* 77  BUN 28* 22  CREATININE 1.34* 1.08  CALCIUM 9.3 9.1  MG 2.1  --    ------------------------------------------------------------------------------------------------------------------  Cardiac Enzymes No results for input(s): TROPONINI in the last 168 hours. ------------------------------------------------------------------------------------------------------------------  RADIOLOGY:  No results found.   ASSESSMENT AND PLAN:   85 year old male with history of diabetes and first toe amputation with recent stent and angioplasty who presented to the hospital after being evaluated by home health nurse with concerns of incision necrosis.   1.  Diabetic left leg and foot infection with recent first ray amputation: Patient is postoperative day #2 left below-knee amputation by vascular surgery.  Continue pain control d/w daughter not appropriate to give patient pain medications while he is sleeping. Vascular surgery to do dressing changes tomorrow Skilled nursing facility upon discharge   2.   Diabetes:  Continue Levemir Continue NovoLog, moderate sliding scale and ADA diet DM nurse following.  3.  Hypothyroidism: Continue Synthroid  4.  Essential hypertension: Continue metoprolol  5.  BPH: Continue Proscar and tamsulosin  6.  Glaucoma: Continue eyedrops  7.  Chronic atrial fibrillation: Currently in normal sinus rhythm. Continue Eliquis.  Management plans discussed with the patient and daughter and they are in agreement.  CODE STATUS: FULL  TOTAL TIME TAKING CARE OF THIS PATIENT: 26 minutes.  Discussed with nursing   POSSIBLE D/C skilled nursing facility possibly tomorrow, DEPENDING ON CLINICAL CONDITION.   Bettey Costa M.D on 10/08/2018 at 10:40 AM  Between 7am to 6pm - Pager - [phone] After 6pm go to [email or web address] - password [email or web address] [facility]  Office  [phone]  CC: Primary care physician; Jearld Fenton, NP  Note: This dictation was prepared with Dragon dictation along with smaller phrase technology. Any transcriptional errors that result from this process are unintentional.

## 2018-10-08 NOTE — Progress Notes (Signed)
Physical Therapy Treatment Patient Details Name: [name]. MRN: [id number] DOB: [birth year] Today's Date: 10/08/2018    History of Present Illness admitted for acute hospitalization status post L BKA (10/06/18)    PT Comments    Improved alertness and active participation with session this date. Mild jerking to extremities with open-chain activities at times; appears to resolve with attention to task and WBing progression.  Able to complete OOB to chair, scoot pivot over level surfaces, mod/max assist this date.  Good ability to assist, but continues to require cuing/assist for forward trunk lean/anterior weight shift. Introduced techniques for L LE desensitization and pressure relief to buttocks; will continue to reinforce as appropriate.    Follow Up Recommendations  SNF     Equipment Recommendations       Recommendations for Other Services       Precautions / Restrictions Precautions Precautions: Fall Precaution Comments: R post-op shoe Restrictions Weight Bearing Restrictions: No    Mobility  Bed Mobility Overal bed mobility: Needs Assistance Bed Mobility: Supine to Sit     Supine to sit: Min assist;Mod assist     General bed mobility comments: assist for truncal elevation and scooting towards edge of bed  Transfers Overall transfer level: Needs assistance   Transfers: Lateral/Scoot Transfers Sit to Stand: Mod assist;Max assist         General transfer comment: level surfaces, bed to chair  Ambulation/Gait                 Stairs             Wheelchair Mobility    Modified Rankin (Stroke Patients Only)       Balance Overall balance assessment: Needs assistance Sitting-balance support: Feet supported;No upper extremity supported Sitting balance-Leahy Scale: Fair Sitting balance - Comments: limited anterior weight translation                                    Cognition   Behavior During Therapy: WFL for  tasks assessed/performed Overall Cognitive Status: Within Functional Limits for tasks assessed                                        Exercises Other Exercises Other Exercises: Unsupported sitting edge of bed, participated with dynamic reaching (R > L) UE to promote forward trunk lean/anterior weight translation and closed-chain activation of R LE.  Close sup throughout activity; forward reach approx 3-4" from immediate BOS. Other Exercises: Chair push-ups, 1x10, for UE/R LE strength and pressure relief.  Fair ability to clear buttocks with initial reps, but does fatigue quickly. Other Exercises: Educated in desensitization techniques for L LE stump (rubbing, tapping, mild pressure); patient returns demonstration of techniques, but does require cuing to initiate.    General Comments        Pertinent Vitals/Pain Pain Assessment: Faces Faces Pain Scale: Hurts a little bit Pain Location: L LE stump Pain Descriptors / Indicators: Aching;Grimacing Pain Intervention(s): Limited activity within patient's tolerance;Monitored during session;Premedicated before session;Repositioned    Home Living                      Prior Function            PT Goals (current goals can now be found in the care plan section) Acute  Rehab PT Goals Patient Stated Goal: agreeable to session PT Goal Formulation: With patient Time For Goal Achievement: 10/21/18 Potential to Achieve Goals: Fair    Frequency    7X/week      PT Plan      Co-evaluation              AM-PAC PT "6 Clicks" Mobility   Outcome Measure  Help needed turning from your back to your side while in a flat bed without using bedrails?: A Lot Help needed moving from lying on your back to sitting on the side of a flat bed without using bedrails?: A Lot Help needed moving to and from a bed to a chair (including a wheelchair)?: A Lot Help needed standing up from a chair using your arms (e.g., wheelchair  or bedside chair)?: Total Help needed to walk in hospital room?: Total Help needed climbing 3-5 steps with a railing? : Total 6 Click Score: 9    End of Session Equipment Utilized During Treatment: Gait belt Activity Tolerance: Patient tolerated treatment well Patient left: in chair;with call bell/phone within reach;with chair alarm set Nurse Communication: Mobility status PT Visit Diagnosis: Unsteadiness on feet (R26.81);Repeated falls (R29.6);History of falling (Z91.81);Difficulty in walking, not elsewhere classified (R26.2)     Time: 6237-6283 PT Time Calculation (min) (ACUTE ONLY): 23 min  Charges:  $Therapeutic Exercise: 8-22 mins $Therapeutic Activity: 8-22 mins                     [name] H. Owens Shark, PT, DPT, NCS 10/08/18, 10:23 AM [phone]

## 2018-10-08 NOTE — Progress Notes (Signed)
Inpatient Diabetes Program Recommendations  AACE/ADA: New Consensus Statement on Inpatient Glycemic Control   Target Ranges:  Prepandial:   less than 140 mg/dL      Peak postprandial:   less than 180 mg/dL (1-2 hours)      Critically ill patients:  140 - 180 mg/dL   Results for [name], [name]" (MRN [id number]) as of 10/08/2018 08:05  Ref. Range 10/07/2018 08:14 10/07/2018 08:52 10/07/2018 09:48 10/07/2018 11:46 10/07/2018 16:44 10/07/2018 21:12 10/08/2018 07:36  Glucose-Capillary Latest Ref Range: 70 - 99 mg/dL 47 (L) 54 (L) 85 159 (H) 226 (H) 291 (H) 185 (H)   Review of Glycemic Control  Diabetes history: DM2 Outpatient Diabetes medications: Levemir 42 units QHS, Humalog 18 units with breakfast, Humalog 3 units with lunch, Humalog 10 units with supper Current orders for Inpatient glycemic control:Levemir27units QHS, Novolog 10 units TID with meals, Novolog 0-9 units TID with meals, Novolog 0-5 units QHS; Prednisone 10 mg QAM  Inpatient Diabetes Program Recommendations:   Insulin-Correction: Please consider increasing Novolog correction to moderate correction scale.  Insulin - Meal Coverage: Noted patient did NOT receive any meal coverage on 10/07/18 (charted as parameters not met; ate less than 50% of meals).  Thanks, Barnie Alderman, RN, MSN, CDE Diabetes Coordinator Inpatient Diabetes Program [phone] (Team Pager from 8am to 5pm)

## 2018-10-09 ENCOUNTER — Inpatient Hospital Stay: Payer: Medicare Other

## 2018-10-09 DIAGNOSIS — Z89512 Acquired absence of left leg below knee: Secondary | ICD-10-CM

## 2018-10-09 LAB — GLUCOSE, CAPILLARY
Glucose-Capillary: 177 mg/dL — ABNORMAL HIGH (ref 70–99)
Glucose-Capillary: 268 mg/dL — ABNORMAL HIGH (ref 70–99)
Glucose-Capillary: 295 mg/dL — ABNORMAL HIGH (ref 70–99)
Glucose-Capillary: 300 mg/dL — ABNORMAL HIGH (ref 70–99)

## 2018-10-09 MED ORDER — LACTULOSE 10 GM/15ML PO SOLN
30.0000 g | Freq: Once | ORAL | Status: AC
  Administered 2018-10-09: 13:00:00 30 g via ORAL
  Filled 2018-10-09: qty 60

## 2018-10-09 NOTE — Discharge Instructions (Signed)
Vascular Surgery Discharge Instructions 1) Patient may shower as of Friday (10/13/18). No bathing or submerging in water. 2) Gently clean incision / staple site with normal saline and pat dry daily. 3) Cover with kerlix daily or change sooner if drainage is noted. 4) Encourage flexibility at the knee joint to avoid contracture.

## 2018-10-09 NOTE — Evaluation (Signed)
Occupational Therapy Evaluation Patient Details Name: [name]. MRN: [id number] DOB: [birth year] Today's Date: 10/09/2018    History of Present Illness admitted for acute hospitalization status post L BKA (10/06/18)   Clinical Impression   Pt seen for OT evaluation this date, POD#3 from above surgery. Prior to sx, pt required assistance for most ADLs at baseline. Uses 4WW for mobility and endorses at least 6 falls in past 12 months. Pt lives with his spouse in a 1 level home with 2 steps to enter and bilateral hand rails. Pt daughter was present t/o evaluation and contributed to PLOF as pt seems to have decreased awareness of physical limitations prior to sx. Pt is eager to return to a more active lifestyle with less pain and improved safety and independence. Pt currently requires mod/max assist for mobility and LB dressing while in seated position due to pain and limited balance. Pt instructed in bed mobility and desensitization strategies for management of residual limb. Education limited 2/2 pt lethargy likely from pain medication. Pt would benefit from skilled OT services including additional instruction in dressing techniques with or without assistive devices for dressing and bathing skills to support recall and carryover prior to discharge and ultimately to maximize safety, independence, and minimize falls risk and caregiver burden. Recommend STR to promote improved strength, safety, and functional independence upon hospital DC.      Follow Up Recommendations  SNF    Equipment Recommendations  None recommended by OT    Recommendations for Other Services       Precautions / Restrictions Precautions Precautions: Fall Restrictions Weight Bearing Restrictions: Yes LLE Weight Bearing: Non weight bearing      Mobility Bed Mobility Overal bed mobility: Needs Assistance Bed Mobility: Supine to Sit     Supine to sit: Min assist;Mod assist     General bed mobility comments:  Pt declined to attempt, stating he felt woozy. Dtr in room also declining attempts at mobility at time of evaluation 2/2 pain medication. Will continue to assess during functional activities. Per PT note, pt req. min/mod assist for sup/sit. Pt also states he is able to use RLE to assist with bed boost.  Transfers                      Balance Overall balance assessment: Needs assistance         Standing balance support: Bilateral upper extremity supported                               ADL either performed or assessed with clinical judgement   ADL Overall ADL's : Needs assistance/impaired Eating/Feeding: Sitting;Set up   Grooming: Sitting;Set up;Supervision/safety   Upper Body Bathing: Sitting;Minimal assistance;Min guard   Lower Body Bathing: Moderate assistance;Maximal assistance;Sitting/lateral leans   Upper Body Dressing : Set up;Supervision/safety;Sitting   Lower Body Dressing: Set up;Moderate assistance;Sitting/lateral leans   Toilet Transfer: Moderate assistance;Maximal assistance;BSC;Stand-pivot;RW   Toileting- Clothing Manipulation and Hygiene: Sitting/lateral lean;Minimal assistance;Set up         General ADL Comments: Likely requires min/mod A to maintain seated balance during ADL tasks.     Vision Baseline Vision/History: Wears glasses;Cataracts(Bilat cataract sx in 2019) Wears Glasses: Reading only Patient Visual Report: No change from baseline       Perception     Praxis      Pertinent Vitals/Pain Pain Score: 6  Pain Location: L LE stump Pain Descriptors /  Indicators: Aching;Grimacing Pain Intervention(s): Limited activity within patient's tolerance;Monitored during session;Premedicated before session     Hand Dominance Right   Extremity/Trunk Assessment Upper Extremity Assessment Upper Extremity Assessment: Generalized weakness;LUE deficits/detail LUE Deficits / Details: Residual limitations from prior stroke. Unable to  complete shoulder flexion beyond midline. Can complete AAROM using RUE for support. RUE WFL. LUE Coordination: decreased gross motor;decreased fine motor   Lower Extremity Assessment Lower Extremity Assessment: Defer to PT evaluation;LLE deficits/detail LLE Deficits / Details: s/p L BKA. Pt able to demonstrate good ROM across all planes. Knee extension limited (bandages?). Pt would like to pursue prosthetics. LLE: Unable to fully assess due to pain       Communication Communication Communication: No difficulties   Cognition Arousal/Alertness: Lethargic;Suspect due to medications Behavior During Therapy: [facility] for tasks assessed/performed Overall Cognitive Status: Within Functional Limits for tasks assessed                                 General Comments: Closed eyes intermittently t/o evaluation, required VCs in order to participate in conversation.   General Comments       Exercises Other Exercises Other Exercises: Educated in desensitization techniques for L LE residual limb mgt including gentle circular massage, gentle tapping, and mild pressure with considerations for safety and infection prevention strategies.   Shoulder Instructions      Home Living Family/patient expects to be discharged to:: Private residence Living Arrangements: Spouse/significant other Available Help at Discharge: Family;Available 24 hours/day Type of Home: House Home Access: Stairs to enter [employer] of Steps: 2 Entrance Stairs-Rails: Right;Left;Can reach both Home Layout: One level     Bathroom Shower/Tub: Tub/shower unit;Walk-in shower   Bathroom Toilet: Handicapped height     Home Equipment: Environmental consultant - 2 wheels;Walker - 4 wheels;Cane - single point;Bedside commode;Shower seat;Shower seat - built in;Grab bars - toilet;Grab bars - tub/shower;Wheelchair - manual;Hospital bed;Other (comment)          Prior Functioning/Environment Level of Independence: Needs  assistance  Gait / Transfers Assistance Needed: Pt ambulates in home with rollator vs front wheeled walker. He reports at least 6 falls in the last 12 months. ADL's / Homemaking Assistance Needed: Pt states that he requires assistance with ADLs/IADLs from wife and niece/dtr; Family assists with bathing while pt sits on commode, pt reports he is able to get short distance to [facility] w/o assist.   Comments: Pt may have decreased awareness of physical limitations. Dtr in room at time of eval denies pt is able to get to [facility] or complete toileting independently. States he requires consistent assistance t/o all ADL tasks.        OT Problem List: Decreased strength;Decreased coordination;Decreased range of motion;Pain;Impaired sensation;Decreased activity tolerance;Decreased safety awareness;Decreased knowledge of use of DME or AE;Impaired balance (sitting and/or standing);Impaired UE functional use      OT Treatment/Interventions: Self-care/ADL training;Modalities;Balance training;Therapeutic exercise;Therapeutic activities;Energy conservation;DME and/or AE instruction;Patient/family education    OT Goals(Current goals can be found in the care plan section) Acute Rehab OT Goals Patient Stated Goal: To be more active and possibly get back to driving. OT Goal Formulation: With patient/family Time For Goal Achievement: 10/23/18 Potential to Achieve Goals: Good ADL Goals Pt Will Perform Grooming: sitting;with supervision(With LRAD PRN for improved safety and functional independence.) Pt Will Perform Lower Body Dressing: with caregiver independent in assisting;with supervision;with adaptive equipment;sitting/lateral leans(With LRAD PRN for improved safety and functional independence.) Pt Will Transfer to  Toilet: anterior/posterior transfer;bedside commode;with min assist(With LRAD/DME PRN for improved safety and functional independence.) Additional ADL Goal #1: Pt will independently verbalize a plan to  implement at least 3 learned falls prevention strategies into his daily routine/home environment upon hospital DC for improved safety and functional independence.  OT Frequency: Min 2X/week   Barriers to D/C: Inaccessible home environment          Co-evaluation              AM-PAC OT "6 Clicks" Daily Activity     Outcome Measure Help from another person eating meals?: A Little Help from another person taking care of personal grooming?: A Little Help from another person toileting, which includes using toliet, bedpan, or urinal?: A Lot Help from another person bathing (including washing, rinsing, drying)?: A Lot Help from another person to put on and taking off regular upper body clothing?: A Little Help from another person to put on and taking off regular lower body clothing?: A Lot 6 Click Score: 15   End of Session    Activity Tolerance: Patient limited by lethargy;Patient limited by pain Patient left: in bed;with bed alarm set;with call bell/phone within reach;with family/visitor present  OT Visit Diagnosis: Other abnormalities of gait and mobility (R26.89);Pain;Muscle weakness (generalized) (M62.81);History of falling (Z91.81) Pain - Right/Left: Left Pain - part of body: Leg;Knee                Time: 3810-1751 OT Time Calculation (min): 30 min Charges:  OT General Charges $OT Visit: 1 Visit OT Evaluation $OT Eval Low Complexity: 1 Low OT Treatments $Self Care/Home Management : 8-22 mins  Shara Blazing, M.S., OTR/L Ascom: [phone] 10/09/18, 9:44 AM

## 2018-10-09 NOTE — Progress Notes (Signed)
Inpatient Diabetes Program Recommendations  AACE/ADA: New Consensus Statement on Inpatient Glycemic Control  Target Ranges:  Prepandial:   less than 140 mg/dL      [facility] postprandial:   less than 180 mg/dL (1-2 hours)      Critically ill patients:  140 - 180 mg/dL   Results for QUENCY, TOBER" (MRN [id number]) as of 10/09/2018 09:10  Ref. Range 10/08/2018 07:36 10/08/2018 11:47 10/08/2018 17:04 10/08/2018 21:02 10/09/2018 07:27  Glucose-Capillary Latest Ref Range: 70 - 99 mg/dL 185 (H) 321 (H) 289 (H) 385 (H) 177 (H)   Review of Glycemic Control  Diabetes history:DM2 Outpatient Diabetes medications:Levemir 42 units QHS, Humalog 18 units with breakfast, Humalog 3 units with lunch, Humalog 10 units with supper Current orders for Inpatient glycemic control:Levemir27units QHS, Novolog10unitsTID with meals, Novolog 0-15 units TID with meals, Novolog 0-5 units QHS; Prednisone 10 mg QAM  Inpatient Diabetes Program Recommendations:   Insulin-Meal Coverage: Please consider increasing meal coverage to Novolog 14 units TID with meals if patient eats at least 50% of meals.  Thanks, Barnie Alderman, RN, MSN, CDE Diabetes Coordinator Inpatient Diabetes Program [phone] (Team Pager from 8am to 5pm)

## 2018-10-09 NOTE — TOC Progression Note (Signed)
Transition of Care (TOC) - Progression Note    Patient Details  Name: [name]. MRN: [id number] Date of Birth: [birth year]  Transition of [facility] CM/SW Contact  Su Hilt, RN Phone Number: 10/09/2018, 2:00 PM  Clinical Narrative:    Otila Kluver at [facility] called to notify me that the patient now has authorization approved by insurance, the patient will DC tomorrow to [facility] to room 712   Expected Discharge Plan: [place] Barriers to Discharge: Continued Medical Work up  Expected Discharge Plan and Services Expected Discharge Plan: [place] In-house Referral: Clinical Social Work Discharge Planning Services: CM Consult   Living arrangements for the past 2 months: Single Family Home                                       Social Determinants of Health (SDOH) Interventions    Readmission Risk Interventions Readmission Risk Prevention Plan 09/16/2018  Transportation Screening Complete  Medication Review Press photographer) Complete  HRI or [place] Complete  SW Recovery Care/Counseling Consult Patient refused  Palliative Care Screening Patient [place] Patient Refused  Some recent data might be hidden

## 2018-10-09 NOTE — Progress Notes (Signed)
[place] [facility] [place] NAME: [name]    MR#:  [id number]   DATE OF BIRTH:  [birth year]  SUBJECTIVE:   Constipated. Still in pain at surgical site.  REVIEW OF SYSTEMS:    Review of Systems  Constitutional: Negative for fever, chills weight loss HENT: Negative for ear pain, nosebleeds, congestion, facial swelling, rhinorrhea, neck pain, neck stiffness and ear discharge.   Respiratory: Negative for cough, shortness of breath, wheezing  Cardiovascular: Negative for chest pain, palpitations and leg swelling.  Gastrointestinal: Negative for heartburn, abdominal pain, vomiting, diarrhea. Genitourinary: Negative for dysuria, urgency, frequency, hematuria Musculoskeletal: Negative for back pain  ++BKA stump pain Neurological: Negative for dizziness, seizures, syncope, focal weakness,  numbness and headaches.  Hematological: Does not bruise/bleed easily.  Psychiatric/Behavioral: Negative for hallucinations, confusion, dysphoric mood Tolerating Diet: yes     DRUG ALLERGIES:   Allergies  Allergen Reactions  . Contrast Media [Iodinated Diagnostic Agents] Shortness Of Breath  . Morphine And Related Other (See Comments)    Hallucinations   . Niacin And Related Dermatitis  . Other     Other reaction(s): SHORTNESS OF BREATH  . Amlodipine Rash    VITALS:  Blood pressure 120/60, pulse 76, temperature 98 F (36.7 C), temperature source Oral, resp. rate 18, height 5\' 6"  (1.676 m), weight 84.8 kg, SpO2 96 %.  PHYSICAL EXAMINATION:  Constitutional: Appears well-developed and well-nourished. No distress. HENT: Normocephalic. Marland Kitchen Oropharynx is clear and moist.  Eyes: Conjunctivae and EOM are normal. PERRLA, no scleral icterus.  Neck: Normal ROM. Neck supple. No JVD. No tracheal deviation. CVS: RRR, S1/S2 +, no murmurs, no gallops, no carotid bruit.  Pulmonary: Effort and breath sounds normal, no stridor, rhonchi, wheezes, rales.  Abdominal:  Soft. BS +,  no distension, tenderness, rebound or guarding.  Musculoskeletal: Normal range of motion. No edema and no tenderness.  Neuro: Alert. CN 2-12 grossly intact. No focal deficits. Skin: Left BKA dressed Dressing without much discharge   psychiatric: Normal mood and affect.      LABORATORY PANEL:   CBC Recent Labs  Lab 10/08/18 0705  WBC 11.2*  HGB 11.2*  HCT 36.1*  PLT 322   ------------------------------------------------------------------------------------------------------------------  Chemistries  Recent Labs  Lab 10/06/18 0504 10/07/18 0647  NA 141 140  K 4.9 3.8  CL 106 108  CO2 26 25  GLUCOSE 109* 77  BUN 28* 22  CREATININE 1.34* 1.08  CALCIUM 9.3 9.1  MG 2.1  --    ------------------------------------------------------------------------------------------------------------------  Cardiac Enzymes No results for input(s): TROPONINI in the last 168 hours. ------------------------------------------------------------------------------------------------------------------  RADIOLOGY:  Dg Abd 2 Views  Result Date: 10/09/2018 CLINICAL DATA:  Status post below the knee amputation. Abdominal distension EXAM: ABDOMEN - 2 VIEW COMPARISON:  None. FINDINGS: There is a large amount of stool throughout the colon. There is no bowel dilatation to suggest obstruction. There is no evidence of pneumoperitoneum, portal venous gas or pneumatosis. There are no pathologic calcifications along the expected course of the ureters. There are cholelithiasis. The osseous structures are unremarkable. IMPRESSION: 1. Large amount of stool throughout the colon. 2. Cholelithiasis. Electronically Signed   By: Kathreen Devoid   On: 10/09/2018 11:47     ASSESSMENT AND PLAN:   85 year old male with history of diabetes and first toe amputation with recent stent and angioplasty who presented to the hospital after being evaluated by home health nurse with concerns of incision  necrosis.  *Constipation Xray showed large stool burden Colace,  miralax. Will give one dose lactulose now  *  Diabetic left leg and foot infection POD #3 left below-knee amputation by vascular surgery.  Continue pain control  Wound healing satisfactory per vascular team Skilled nursing facility upon discharge  *  Diabetes: Continue Levemir Continue NovoLog, moderate sliding scale and ADA diet Stopped D5 today  *  Hypothyroidism: Continue Synthroid  *  Essential hypertension: Continue metoprolol  *  BPH: Continue Proscar and tamsulosin  *  Glaucoma: Continue eyedrops  *  Chronic atrial fibrillation: Currently in normal sinus rhythm. Continue Eliquis.  Management plans discussed with the patient and daughter and they are in agreement.  CODE STATUS: FULL  TOTAL TIME TAKING CARE OF THIS PATIENT: 26 minutes.   POSSIBLE D/C skilled nursing facility possibly tomorrow, DEPENDING ON CLINICAL CONDITION.   [name] M.D on 10/09/2018 at 12:12 PM  Between 7am to 6pm - Pager - [phone] After 6pm go to [email or web address] - password [email or web address] [place] [facility]  Office  [phone]  CC: Primary care physician; Jearld Fenton, NP  Note: This dictation was prepared with Dragon dictation along with smaller phrase technology. Any transcriptional errors that result from this process are unintentional.

## 2018-10-09 NOTE — Progress Notes (Signed)
Physical Therapy Treatment Patient Details Name: [name]. MRN: [id number] DOB: 04/18/1931 Today's Date: 10/09/2018    History of Present Illness admitted for acute hospitalization status post L BKA (10/06/18)    PT Comments    Drowsy upon arrival to room, but alertness improves with transition to upright/unsupported sitting edge of bed.  Emphasis this session on sit/stand progression.  Requires elevated bed surface and use of RW, but able to complete with min/mod assist +1-2 for safety.  Bed surface progressively lowered to increase challenge; unable to complete from standard height surface without extensive assist.  Did attempt R LE stepping/hopping; unable to generate adequate force through bilat UEs and limited by generalized weakness at this point. Do anticipate need for WC level as primary mobility in the immediate time frame. Continue to emphasize L knee extension positioning and therex with both patient/daughter (educated in L LE quad sets as HEP).  Both voice understanding; will continue to reinforce as needed.    Follow Up Recommendations  SNF     Equipment Recommendations  Rolling walker with 5" wheels    Recommendations for Other Services       Precautions / Restrictions Precautions Precautions: Fall Restrictions Weight Bearing Restrictions: No LLE Weight Bearing: Non weight bearing    Mobility  Bed Mobility Overal bed mobility: Needs Assistance Bed Mobility: Supine to Sit     Supine to sit: Min assist;Mod assist     General bed mobility comments: improved ability to actively assist with movement transition this date  Transfers Overall transfer level: Needs assistance Equipment used: Rolling walker (2 wheeled)   Sit to Stand: Min assist;Mod assist;+2 safety/equipment         General transfer comment: elevated bed surface with RW  Ambulation/Gait             General Gait Details: unsafe/unable due to generalized fatigue   Stairs              Wheelchair Mobility    Modified Rankin (Stroke Patients Only)       Balance Overall balance assessment: Needs assistance Sitting-balance support: No upper extremity supported;Feet supported Sitting balance-Leahy Scale: Fair     Standing balance support: Bilateral upper extremity supported Standing balance-Leahy Scale: Poor                              Cognition Arousal/Alertness: Awake/alert Behavior During Therapy: WFL for tasks assessed/performed Overall Cognitive Status: Within Functional Limits for tasks assessed                                 General Comments: Closed eyes intermittently t/o evaluation, required VCs in order to participate in conversation.      Exercises Other Exercises Other Exercises: Sit/stand x6 from elevated (but progressively lowered) bed surface with RW, min/mod assist +1-2 for safety.  Heavy use of bilat UEs to assist with lift off; unable to complete from standard height.  Did attempt progression towards stepping/hopping; however, unable to generate adequate force through bilat UEs, unable to tolerate due to LE fatigue. Other Exercises: Rolling bilat, min assist, for linen management; max assist for scooting up in bed. Other Exercises: Reviewed supine L LE quad sets and overall positioning (emphasis on knee extension) with patient and daughter; both voiced understanding.  will continue to reinforce.    General Comments  Pertinent Vitals/Pain Pain Assessment: Faces Pain Score: 6  Faces Pain Scale: Hurts little more Pain Location: L LE stump Pain Descriptors / Indicators: Throbbing Pain Intervention(s): Limited activity within patient's tolerance;Monitored during session;Premedicated before session;Repositioned    Home Living Family/patient expects to be discharged to:: Private residence Living Arrangements: Spouse/significant other Available Help at Discharge: Family;Available 24  hours/day Type of Home: House Home Access: Stairs to enter Entrance Stairs-Rails: Right;Left;Can reach both Home Layout: One level Home Equipment: Environmental consultant - 2 wheels;Walker - 4 wheels;Cane - single point;Bedside commode;Shower seat;Shower seat - built in;Grab bars - toilet;Grab bars - tub/shower;Wheelchair - manual;Hospital bed;Other (comment)      Prior Function Level of Independence: Needs assistance  Gait / Transfers Assistance Needed: Pt ambulates in home with rollator vs front wheeled walker. He reports at least 6 falls in the last 12 months. ADL's / Homemaking Assistance Needed: Pt states that he requires assistance with ADLs/IADLs from wife and niece/dtr; Family assists with bathing while pt sits on commode, pt reports he is able to get short distance to [facility] w/o assist. Comments: Pt may have decreased awareness of physical limitations. Dtr in room at time of eval denies pt is able to get to [facility] or complete toileting independently. States he requires consistent assistance t/o all ADL tasks.   PT Goals (current goals can now be found in the care plan section) Acute Rehab PT Goals Patient Stated Goal: To be more active and possibly get back to driving. PT Goal Formulation: With patient Time For Goal Achievement: 10/21/18 Potential to Achieve Goals: Fair Progress towards PT goals: Progressing toward goals    Frequency    7X/week      PT Plan Current plan remains appropriate    Co-evaluation              AM-PAC PT "6 Clicks" Mobility   Outcome Measure  Help needed turning from your back to your side while in a flat bed without using bedrails?: A Little Help needed moving from lying on your back to sitting on the side of a flat bed without using bedrails?: A Little Help needed moving to and from a bed to a chair (including a wheelchair)?: A Lot Help needed standing up from a chair using your arms (e.g., wheelchair or bedside chair)?: A Lot Help needed to walk in hospital  room?: Total Help needed climbing 3-5 steps with a railing? : Total 6 Click Score: 12    End of Session Equipment Utilized During Treatment: Gait belt Activity Tolerance: Patient tolerated treatment well Patient left: in bed;with call bell/phone within reach;with bed alarm set Nurse Communication: Mobility status PT Visit Diagnosis: Unsteadiness on feet (R26.81);Repeated falls (R29.6);History of falling (Z91.81);Difficulty in walking, not elsewhere classified (R26.2)     Time: 6226-3335 PT Time Calculation (min) (ACUTE ONLY): 23 min  Charges:  $Therapeutic Activity: 23-37 mins                     Onell Mcmath H. Owens Shark, PT, DPT, NCS 10/09/18, 10:27 AM [phone]

## 2018-10-09 NOTE — Progress Notes (Signed)
[facility] Daily Progress Note   Subjective: 3 Days Post-Op: Left below-the-knee amputation  Patient with come continued stump pain this AM. No issues over the weekend.   Objective: Vitals:   10/08/18 1935 10/08/18 2123 10/09/18 0413 10/09/18 0725  BP: (!) 128/53 (!) 116/53 133/69 123/65  Pulse: 78 70 76 82  Resp: 20  18   Temp: 97.6 F (36.4 C)  97.7 F (36.5 C) 98.5 F (36.9 C)  TempSrc:    Oral  SpO2: 100%  99% 96%  Weight:      Height:        Intake/Output Summary (Last 24 hours) at 10/09/2018 0954 Last data filed at 10/09/2018 0900 Gross per 24 hour  Intake 240 ml  Output 600 ml  Net -360 ml   Physical Exam: A&Ox3, NAD CV: RRR Pulmonary: CTA Bilaterally Abdomen: Soft, Nontender, Nondistended Vascular:  Left Lower Extremity: OR dressing removed. Thigh soft. Calf soft. Knee flexible at the joint. Staples: clean, dry and intact. Stump healthy. No drainage or ecchymosis noted.    Document Information Photos    10/09/2018 09:20  Attached To:  Hospital Encounter on 10/03/18  Source Information Haidan Nhan, Janalyn Harder, PA-C  [facility] (1a)    Laboratory: CBC    Component Value Date/Time   WBC 11.2 (H) 10/08/2018 0705   HGB 11.2 (L) 10/08/2018 0705   HGB 13.3 08/05/2014 1839   HCT 36.1 (L) 10/08/2018 0705   HCT 40.1 08/05/2014 1839   PLT 322 10/08/2018 0705   PLT 246 08/05/2014 1839   BMET    Component Value Date/Time   NA 140 10/07/2018 0647   NA 143 02/21/2015 1108   NA 137 08/05/2014 1839   K 3.8 10/07/2018 0647   K 3.6 08/05/2014 1839   CL 108 10/07/2018 0647   CL 106 08/05/2014 1839   CO2 25 10/07/2018 0647   CO2 23 08/05/2014 1839   GLUCOSE 77 10/07/2018 0647   GLUCOSE 235 (H) 08/05/2014 1839   BUN 22 10/07/2018 0647   BUN 26 02/21/2015 1108   BUN 19 08/05/2014 1839   CREATININE 1.08 10/07/2018 0647   CREATININE 1.20 08/05/2014 1839   CALCIUM 9.1 10/07/2018 0647   CALCIUM 8.6 (L) 08/05/2014 1839   GFRNONAA  >60 10/07/2018 0647   GFRNONAA 57 (L) 08/05/2014 1839   GFRAA >60 10/07/2018 0647   GFRAA >60 08/05/2014 1839   Assessment/Planning: The patient is an 85 year old male with a past medical history ofhypertension, hyperlipidemia, GERD, diabetes, CVA(1999, 2016),COPD, congestive heart failure, chronic atrial fibrillation, lung cancer, coronary artery disease, arthritis, peripheral artery disease with a chronic left foot wound s/p Left BKA POD#3 1) Doing well. Stump healing without complication 2) H&H stable 3) PT and OT recommending SNF for on discharge - both patient and daughter are in agreement 4) OK from vascular standpoint to discharge to SNF when medically stable.   Discussed with Dr. Ellis Parents Lanee Chain PA-C 10/09/2018 9:54 AM

## 2018-10-09 NOTE — Care Management Important Message (Signed)
Important Message  Patient Details  Name: [name]. MRN: [id number] Date of Birth: [birth year]   Medicare Important Message Given:  Yes     Dannette Barbara 10/09/2018, 12:01 PM

## 2018-10-10 ENCOUNTER — Inpatient Hospital Stay: Payer: Medicare Other

## 2018-10-10 LAB — COMPREHENSIVE METABOLIC PANEL
ALT: 14 U/L (ref 0–44)
AST: 14 U/L — ABNORMAL LOW (ref 15–41)
Albumin: 2.7 g/dL — ABNORMAL LOW (ref 3.5–5.0)
Alkaline Phosphatase: 73 U/L (ref 38–126)
Anion gap: 11 (ref 5–15)
BUN: 36 mg/dL — ABNORMAL HIGH (ref 8–23)
CO2: 28 mmol/L (ref 22–32)
Calcium: 9.6 mg/dL (ref 8.9–10.3)
Chloride: 101 mmol/L (ref 98–111)
Creatinine, Ser: 1.13 mg/dL (ref 0.61–1.24)
GFR calc Af Amer: >60 mL/min (ref >60)
GFR calc non Af Amer: 59 mL/min — ABNORMAL LOW (ref >60)
Glucose, Bld: 226 mg/dL — ABNORMAL HIGH (ref 70–99)
Potassium: 4.3 mmol/L (ref 3.5–5.1)
Sodium: 140 mmol/L (ref 135–145)
Total Bilirubin: 0.4 mg/dL (ref 0.3–1.2)
Total Protein: 6.3 g/dL — ABNORMAL LOW (ref 6.5–8.1)

## 2018-10-10 LAB — CBC
HCT: 37.5 % — ABNORMAL LOW (ref 39.0–52.0)
Hemoglobin: 11.7 g/dL — ABNORMAL LOW (ref 13.0–17.0)
MCH: 28.3 pg (ref 26.0–34.0)
MCHC: 31.2 g/dL (ref 30.0–36.0)
MCV: 90.6 fL (ref 80.0–100.0)
Platelets: 330 10*3/uL (ref 150–400)
RBC: 4.14 MIL/uL — ABNORMAL LOW (ref 4.22–5.81)
RDW: 17.1 % — ABNORMAL HIGH (ref 11.5–15.5)
WBC: 18.1 10*3/uL — ABNORMAL HIGH (ref 4.0–10.5)
nRBC: 0 % (ref 0.0–0.2)

## 2018-10-10 LAB — GLUCOSE, CAPILLARY
Glucose-Capillary: 363 mg/dL — ABNORMAL HIGH (ref 70–99)
Glucose-Capillary: 292 mg/dL — ABNORMAL HIGH (ref 70–99)
Glucose-Capillary: 274 mg/dL — ABNORMAL HIGH (ref 70–99)
Glucose-Capillary: 136 mg/dL — ABNORMAL HIGH (ref 70–99)

## 2018-10-10 LAB — LACTIC ACID, PLASMA
Lactic Acid, Venous: 1.7 mmol/L (ref 0.5–1.9)
Lactic Acid, Venous: 1.6 mmol/L (ref 0.5–1.9)

## 2018-10-10 LAB — AMMONIA: Ammonia: <9 umol/L — ABNORMAL LOW (ref 9–35)

## 2018-10-10 MED ORDER — LACTULOSE 10 GM/15ML PO SOLN
30.0000 g | Freq: Four times a day (QID) | ORAL | Status: AC
  Administered 2018-10-10 (×2): 30 g via ORAL
  Filled 2018-10-10 (×2): qty 60

## 2018-10-10 MED ORDER — ALPRAZOLAM 0.25 MG PO TABS: 0.2500 mg | ORAL_TABLET | Freq: Every evening | ORAL | 0 refills | Status: DC | PRN

## 2018-10-10 MED ORDER — DOCUSATE SODIUM 100 MG PO CAPS: 100.0000 mg | ORAL_CAPSULE | Freq: Two times a day (BID) | ORAL | 0 refills | Status: AC

## 2018-10-10 MED ORDER — OXYCODONE HCL 10 MG PO TABS: 10.0000 mg | ORAL_TABLET | Freq: Four times a day (QID) | ORAL | 0 refills | Status: DC | PRN

## 2018-10-10 MED ORDER — POLYETHYLENE GLYCOL 3350 17 G PO PACK: 17.0000 g | PACK | Freq: Every day | ORAL | 0 refills | Status: DC

## 2018-10-10 MED ORDER — INSULIN DETEMIR 100 UNIT/ML ~~LOC~~ SOLN
32.0000 [IU] | Freq: Every day | SUBCUTANEOUS | Status: DC
  Administered 2018-10-10 – 2018-10-13 (×3): 32 [IU] via SUBCUTANEOUS
  Filled 2018-10-10 (×4): qty 0.32

## 2018-10-10 MED ORDER — INSULIN ASPART 100 UNIT/ML ~~LOC~~ SOLN
14.0000 [IU] | Freq: Three times a day (TID) | SUBCUTANEOUS | Status: DC
  Administered 2018-10-10 – 2018-10-13 (×10): 14 [IU] via SUBCUTANEOUS
  Filled 2018-10-10 (×10): qty 1

## 2018-10-10 NOTE — Progress Notes (Signed)
[facility] [place] NAME: [name]    MR#:  [id number]   DATE OF BIRTH:  09-04-1931  SUBJECTIVE:   Constipated. Still in pain at surgical site.  Had Dulcolax suppository yesterday with no response  REVIEW OF SYSTEMS:    Review of Systems  Constitutional: Negative for fever, chills weight loss HENT: Negative for ear pain, nosebleeds, congestion, facial swelling, rhinorrhea, neck pain, neck stiffness and ear discharge.   Respiratory: Negative for cough, shortness of breath, wheezing  Cardiovascular: Negative for chest pain, palpitations and leg swelling.  Gastrointestinal: Negative for heartburn, abdominal pain, vomiting, diarrhea. Genitourinary: Negative for dysuria, urgency, frequency, hematuria Musculoskeletal: Negative for back pain  ++BKA stump pain Neurological: Negative for dizziness, seizures, syncope, focal weakness,  numbness and headaches.  Hematological: Does not bruise/bleed easily.  Psychiatric/Behavioral: Negative for hallucinations, confusion, dysphoric mood Tolerating Diet: yes     DRUG ALLERGIES:   Allergies  Allergen Reactions  . Contrast Media [Iodinated Diagnostic Agents] Shortness Of Breath  . Morphine And Related Other (See Comments)    Hallucinations   . Niacin And Related Dermatitis  . Other     Other reaction(s): SHORTNESS OF BREATH  . Amlodipine Rash    VITALS:  Blood pressure 115/83, pulse 91, temperature 97.8 F (36.6 C), temperature source Oral, resp. rate 18, height 5\' 6"  (1.676 m), weight 84.8 kg, SpO2 95 %.  PHYSICAL EXAMINATION:  Constitutional: Appears well-developed and well-nourished. No distress. HENT: Normocephalic. Marland Kitchen Oropharynx is clear and moist.  Eyes: Conjunctivae and EOM are normal. PERRLA, no scleral icterus.  Neck: Normal ROM. Neck supple. No JVD. No tracheal deviation. CVS: RRR, S1/S2 +, no murmurs, no gallops, no carotid bruit.  Pulmonary: Effort and breath sounds  normal, no stridor, rhonchi, wheezes, rales.  Abdominal: Soft. BS +,  no distension, tenderness, rebound or guarding.  Musculoskeletal: Normal range of motion. No edema and no tenderness.  Neuro: Alert. CN 2-12 grossly intact. No focal deficits. Skin: Left BKA dressed Dressing without much discharge   psychiatric: Normal mood and affect.   LABORATORY PANEL:   CBC Recent Labs  Lab 10/08/18 0705  WBC 11.2*  HGB 11.2*  HCT 36.1*  PLT 322   ------------------------------------------------------------------------------------------------------------------  Chemistries  Recent Labs  Lab 10/06/18 0504 10/07/18 0647  NA 141 140  K 4.9 3.8  CL 106 108  CO2 26 25  GLUCOSE 109* 77  BUN 28* 22  CREATININE 1.34* 1.08  CALCIUM 9.3 9.1  MG 2.1  --    ------------------------------------------------------------------------------------------------------------------  Cardiac Enzymes No results for input(s): TROPONINI in the last 168 hours. ------------------------------------------------------------------------------------------------------------------  RADIOLOGY:  Dg Abd 2 Views  Result Date: 10/09/2018 CLINICAL DATA:  Status post below the knee amputation. Abdominal distension EXAM: ABDOMEN - 2 VIEW COMPARISON:  None. FINDINGS: There is a large amount of stool throughout the colon. There is no bowel dilatation to suggest obstruction. There is no evidence of pneumoperitoneum, portal venous gas or pneumatosis. There are no pathologic calcifications along the expected course of the ureters. There are cholelithiasis. The osseous structures are unremarkable. IMPRESSION: 1. Large amount of stool throughout the colon. 2. Cholelithiasis. Electronically Signed   By: Kathreen Devoid   On: 10/09/2018 11:47     ASSESSMENT AND PLAN:   85 year old male with history of diabetes and first toe amputation with recent stent and angioplasty who presented to the hospital after being evaluated by home  health nurse with concerns of incision necrosis.  *Constipation Xray  showed large stool burden.  No obstruction or ileus Colace, miralax. Discussed with patient and daughter.  No abdominal tenderness or significant distention.  But definite concern for progress to ileus if no bowel movement.  Will add oral lactulose.  The refusing enema.  *  Diabetic left leg and foot infection POD #4 left below-knee amputation by vascular surgery.  Continue pain control  Wound healing satisfactory per vascular team Skilled nursing facility upon discharge  *  Diabetes: Continue Levemir , increase pre-meal NovoLog, moderate sliding scale and ADA diet   *  Hypothyroidism: Continue Synthroid  *  Essential hypertension: Continue metoprolol  *  BPH: Continue Proscar and tamsulosin  *  Glaucoma: Continue eyedrops  *  Chronic atrial fibrillation: Currently in normal sinus rhythm. Continue Eliquis.  Management plans discussed with the patient and daughter and they are in agreement.  CODE STATUS: FULL  TOTAL TIME TAKING CARE OF THIS PATIENT: 35 minutes.   POSSIBLE D/C skilled nursing facility possibly tomorrow, DEPENDING ON CLINICAL CONDITION.    [name] Skylee Baird M.D on 10/10/2018 at 11:01 AM  Between 7am to 6pm - Pager - [phone]  After 6pm go to [email or web address] - password [email or web address] [place] [facility]  Office  [phone]  CC: Primary care physician; Jearld Fenton, NP  Note: This dictation was prepared with Dragon dictation along with smaller phrase technology. Any transcriptional errors that result from this process are unintentional.

## 2018-10-10 NOTE — Progress Notes (Addendum)
5.45 pmPatient continues to have constipation.  No abdominal tenderness.  Some nausea.  Daughter requested transfer to [facility].  Ordered chest x-ray, abdominal x-ray, CMP, CBC, ammonia.  Discussed with daughter Butch Penny at length regarding patient have no significant symptoms and need to continue to manage constipation medically.  She is extremely anxious.  She is also worried about patient going to skilled nursing facility and she not being able to see him.  6.30 pm Patient has nausea and occasional vomiting. Still no bowel movement. start chest takes random abdominal extraordinary which show nothing acute. Lab work done and shows my leukocytosis but no fever or source of infection found. Family has requested transfer to [facility] medical center. Called [facility] transfer center and waiting for call back. They didn't mention that all three facilities they have are at capacity and transfer might take a wh ile. Will wait for their call and confirmation. I did inform family that there's a high chance [facility] might reject the transfer request as nothing acute is going on.  Called and updated daughter Butch Penny.     Addendum 9.30 PM Spoke with Dr. Riki Altes MD at [facility] to continue care at [facility] as they will not be able to offer anything different compared to care here.  Advised to call if anything changes   Total time spent 50 minutes with more than 50% of time face-to-face discussing with patient, daughter and coordination of care

## 2018-10-10 NOTE — Progress Notes (Signed)
OT Cancellation Note  Patient Details Name: [name]. MRN: [id number] DOB: 1931-08-01   Cancelled Treatment:    Reason Eval/Treat Not Completed: Fatigue/lethargy limiting ability to participate. Pt sleeping upon attempt, wakes to OT's voice. Pt unable to keep eyes open during instruction on stump mgt. Will hold OT treatment this date and re-attempt next date as medically appropriate.   Jeni Salles, MPH, MS, OTR/L ascom [phone] 10/10/18, 3:11 PM

## 2018-10-10 NOTE — Progress Notes (Signed)
Physical Therapy Treatment Patient Details Name: [name]. MRN: [id number] DOB: 02-14-1932 Today's Date: 10/10/2018    History of Present Illness admitted for acute hospitalization status post L BKA (10/06/18)    PT Comments    2nd attempt for treatment. Pt is making gradual progress towards goals, however very fatigued this date. Pt able to stand with increased independence but only able to tolerate 2 bouts of 10-15 seconds of standing before fatigue. Pt reports still being constipated this date. Will continue to progress.   Follow Up Recommendations  SNF     Equipment Recommendations  Rolling walker with 5" wheels    Recommendations for Other Services       Precautions / Restrictions Precautions Precautions: Fall Precaution Comments: R post-op shoe Restrictions Weight Bearing Restrictions: Yes LLE Weight Bearing: Non weight bearing    Mobility  Bed Mobility Overal bed mobility: Needs Assistance Bed Mobility: Supine to Sit     Supine to sit: Min assist     General bed mobility comments: able to perform transfer to EOB with assist needed for trunk support. Once seated able to maintain balance  Transfers Overall transfer level: Needs assistance Equipment used: Rolling walker (2 wheeled) Transfers: Sit to/from Stand Sit to Stand: Min assist         General transfer comment: 2 standing attempts performed using RW. Bed elevated prior. Once standing able to perform pre gait activities including heel rises x 5. Pt fatigues quickly and request to return to supine  Ambulation/Gait             General Gait Details: unsafe/unable due to generalized fatigue   Stairs             Wheelchair Mobility    Modified Rankin (Stroke Patients Only)       Balance Overall balance assessment: Needs assistance Sitting-balance support: No upper extremity supported;Feet supported Sitting balance-Leahy Scale: Fair     Standing balance support: Bilateral  upper extremity supported Standing balance-Leahy Scale: Fair                              Cognition Arousal/Alertness: Awake/alert Behavior During Therapy: WFL for tasks assessed/performed Overall Cognitive Status: Within Functional Limits for tasks assessed                                        Exercises Other Exercises Other Exercises: Performed supine ther-ex including quad sets (takes increased effort), SLRs, heel slides x 15 reps on R LE. Also performed tapping/touching on L residual limb.    General Comments        Pertinent Vitals/Pain Pain Assessment: No/denies pain    Home Living                      Prior Function            PT Goals (current goals can now be found in the care plan section) Acute Rehab PT Goals Patient Stated Goal: To be more active and possibly get back to driving. PT Goal Formulation: With patient Time For Goal Achievement: 10/21/18 Potential to Achieve Goals: Fair Progress towards PT goals: Progressing toward goals    Frequency    7X/week      PT Plan Current plan remains appropriate    Co-evaluation  AM-PAC PT "6 Clicks" Mobility   Outcome Measure  Help needed turning from your back to your side while in a flat bed without using bedrails?: A Little Help needed moving from lying on your back to sitting on the side of a flat bed without using bedrails?: A Little Help needed moving to and from a bed to a chair (including a wheelchair)?: A Lot Help needed standing up from a chair using your arms (e.g., wheelchair or bedside chair)?: A Lot Help needed to walk in hospital room?: Total Help needed climbing 3-5 steps with a railing? : Total 6 Click Score: 12    End of Session Equipment Utilized During Treatment: Gait belt Activity Tolerance: Patient limited by fatigue Patient left: in bed;with call bell/phone within reach;with bed alarm set Nurse Communication: Mobility  status PT Visit Diagnosis: Unsteadiness on feet (R26.81);Repeated falls (R29.6);History of falling (Z91.81);Difficulty in walking, not elsewhere classified (R26.2)     Time: 0044-7158 PT Time Calculation (min) (ACUTE ONLY): 28 min  Charges:  $Therapeutic Exercise: 8-22 mins                     Greggory Stallion, PT, DPT [phone]    Mandel Seiden 10/10/2018, 3:36 PM

## 2018-10-10 NOTE — Discharge Summary (Addendum)
[facility] NAME: [name]    MR#:  [id number]  DATE OF BIRTH:  12/06/1931  DATE OF ADMISSION:  10/03/2018 ADMITTING PHYSICIAN: Lang Snow, NP  DATE OF DISCHARGE: 10/13/2018  PRIMARY CARE PHYSICIAN: Jearld Fenton, NP   ADMISSION DIAGNOSIS:  Osteomyolytis of left foot  DISCHARGE DIAGNOSIS:  Active Problems:   Diabetic foot infection ([place])   SECONDARY DIAGNOSIS:   Past Medical History:  Diagnosis Date  . Arthritis   . CAD    a. MI 01/29/1996 tx'd w/ TPA @ [facility]; b. Myoview 06/2005: EF 50%, scar @ apex, mild peri-infarct ischemia  . Cancer ([place])    skin  . Chronic atrial fibrillation    a. since 2006; b. on warfarin  . Chronic diastolic CHF (congestive heart failure) ([place])    a. echo 04/2006: EF lower limits of nl, mod LVH, mild aortic root dilatation, & mild MR, biatrial enlargement; b. echo 04/2013: EF 60%, mod dilated LA, mild MR & TR, mod pulm HTN w/ RV systolic pressure 53, c. echo 04/21/14: EF 55-60%, unable to exclude WMA, severely dilated LA 6.6 cm, nl RVSP, mildly dilated aortic root  . COPD (chronic obstructive pulmonary disease) (HCC)    oxygen prn at home  . CVA [id number]   x2  . DM   . Falls   . GERD (gastroesophageal reflux disease)   . History of kidney stones   . HYPERLIPIDEMIA   . HYPERTENSION   . Kidney stone    a. s/p left ureteral stenting 04/24/14  . Left arm weakness    limited movement. S/P fall injury  . Neuropathy of both feet   . Poor balance   . Wears dentures    full upper and lower     ADMITTING HISTORY  [name]  is a 85 y.o. male with a known history of diabetes with foot ulceration.  He states recently they amputated his toe and 2 weeks ago cut off more tissue.  I stated that Dr. Lucky Cowboy placed a larger stent and angioplasties recently.  He started having more drainage and foul smelling discharge.  His home health nurse took a picture and sent it to Dr. Cleda Mccreedy and he  wanted the [name] to be admitted for IV antibiotics and potential amputation.  [name] arrived for direct admission.  Currently feels okay.  HOSPITAL COURSE:   85 year old male with history of diabetes and first toe amputation with recent stent and angioplasty who presented to the hospital after being evaluated by home health nurse with concerns of incision necrosis.  *Constipation Xray showed large stool burden.  No obstruction or ileus Colace, miralax. Discussed with [name] and daughter.  No abdominal tenderness or significant distention.  But definite concern for progress to ileus if no bowel movement.  Will add oral lactulose.  The refusing enema.  *  Diabetic left leg and foot infection POD #7 left below-knee amputation by vascular surgery.  Continue pain control  Wound healing satisfactory per vascular team Skilled nursing facility upon discharge  *  Diabetes: Continue Levemir , increase pre-meal NovoLog, moderate sliding scale and ADA diet   *  Hypothyroidism: Continue Synthroid  *  Essential hypertension: Continue metoprolol  *  BPH: Continue Proscar and tamsulosin  *  Glaucoma: Continue eyedrops  *  Chronic atrial fibrillation: Currently in normal sinus rhythm. Continue Eliquis.  *Constipation Xray showed large stool burden.  No obstruction or ileus Colace, miralax.  CT abd  with nothing acute Resolved Needs aggressive bowel regimen  * Left lower lobe pneumonia Started levaquin 4 more days after discharge PO.  Stable for discharge  CONSULTS OBTAINED:  Treatment Team:  Algernon Huxley, MD Troxler, Abel Presto, MD  DRUG ALLERGIES:   Allergies  Allergen Reactions  . Contrast Media [Iodinated Diagnostic Agents] Shortness Of Breath  . Morphine And Related Other (See Comments)    Hallucinations   . Niacin And Related Dermatitis  . Other     Other reaction(s): SHORTNESS OF BREATH  . Amlodipine Rash    DISCHARGE MEDICATIONS:    Allergies as of 10/13/2018      Reactions   Contrast Media [iodinated Diagnostic Agents] Shortness Of Breath   Morphine And Related Other (See Comments)   Hallucinations   Niacin And Related Dermatitis   Other    Other reaction(s): SHORTNESS OF BREATH   Amlodipine Rash      Medication List    STOP taking these medications   cephALEXin 500 MG capsule Commonly known as: KEFLEX   clotrimazole 1 % cream Commonly known as: LOTRIMIN   traMADol 50 MG tablet Commonly known as: ULTRAM     TAKE these medications   acetaminophen 650 MG CR tablet Commonly known as: TYLENOL Take 650 mg by mouth every 8 (eight) hours as needed for pain.   albuterol 108 (90 Base) MCG/ACT inhaler Commonly known as: ProAir HFA USE 1 TO 2 INHALATIONS EVERY 4 HOURS AS NEEDED What changed:   how much to take  how to take this  when to take this  reasons to take this  additional instructions   ALPRAZolam 0.25 MG tablet Commonly known as: XANAX Take 1 tablet (0.25 mg total) by mouth at bedtime as needed for anxiety.   atorvastatin 40 MG tablet Commonly known as: LIPITOR TAKE 1 TABLET DAILY What changed: when to take this   citalopram 10 MG tablet Commonly known as: CELEXA TAKE 1 TABLET DAILY   Colcrys 0.6 MG tablet Generic drug: colchicine TAKE 1 TABLET TWICE A DAY AS NEEDED (ACUTE GOUT FLARE) USE AS DIRECTED What changed: See the new instructions.   Combigan 0.2-0.5 % ophthalmic solution Generic drug: brimonidine-timolol Place 1 drop into both eyes 2 (two) times a day.   diclofenac sodium 1 % Gel Commonly known as: VOLTAREN Apply 4 g topically 4 (four) times daily.   docusate sodium 100 MG capsule Commonly known as: COLACE Take 1 capsule (100 mg total) by mouth 2 (two) times daily.   dorzolamide 2 % ophthalmic solution Commonly known as: TRUSOPT Place 1 drop into both eyes 3 (three) times daily.   Eliquis 5 MG Tabs tablet Generic drug: apixaban TAKE 1 TABLET TWICE A  DAY What changed: how much to take   ezetimibe 10 MG tablet Commonly known as: ZETIA TAKE 1 TABLET DAILY   finasteride 5 MG tablet Commonly known as: PROSCAR TAKE 1 TABLET DAILY   gabapentin 100 MG capsule Commonly known as: NEURONTIN TAKE 1 CAPSULE THREE TIMES A DAY   glipiZIDE 5 MG tablet Commonly known as: GLUCOTROL Take 5 mg by mouth 2 (two) times daily before a meal.   Incruse Ellipta 62.5 MCG/INH Aepb Generic drug: umeclidinium bromide Inhale 1 puff into the lungs daily.   insulin detemir 100 UNIT/ML injection Commonly known as: LEVEMIR Inject 0.28 mLs (28 Units total) into the skin daily at 10 pm. What changed: how much to take   insulin lispro 100 UNIT/ML injection Commonly known as:  HumaLOG Inject 0.04 mLs (4 Units total) into the skin 3 (three) times daily with meals. What changed:   how much to take  additional instructions   Insulin Pen Needle 32G X 4 MM Misc Commonly known as: BD Pen Needle Nano U/F USE THREE TIMES A DAY FOR INSULIN ADMINISTRATION   Insulin Syringe-Needle U-100 30G X 1/2" 1 ML Misc 1 each by Does not apply route 3 (three) times daily.   latanoprost 0.005 % ophthalmic solution Commonly known as: XALATAN Place 1 drop into both eyes at bedtime.   levofloxacin 250 MG tablet Commonly known as: Levaquin Take 1 tablet (250 mg total) by mouth daily.   levothyroxine 50 MCG tablet Commonly known as: SYNTHROID TAKE 1 TABLET DAILY BEFORE BREAKFAST   magnesium citrate Soln Take 296 mLs (1 Bottle total) by mouth daily as needed for severe constipation.   metolazone 5 MG tablet Commonly known as: ZAROXOLYN Take 1 tablet (5 mg total) by mouth daily as needed (swelling).   metoprolol succinate 50 MG 24 hr tablet Commonly known as: TOPROL-XL TAKE 1 TABLET TWICE A DAY What changed: when to take this   montelukast 10 MG tablet Commonly known as: SINGULAIR TAKE 1 TABLET AT BEDTIME   nitroGLYCERIN 0.4 MG SL tablet Commonly known as:  NITROSTAT Place 0.4 mg under the tongue every 5 (five) minutes as needed for chest pain.   Oxycodone HCl 10 MG Tabs Commonly known as: Roxicodone Take 1 tablet (10 mg total) by mouth every 6 (six) hours as needed for moderate pain or severe pain.   polyethylene glycol 17 g packet Commonly known as: MIRALAX / GLYCOLAX Take 17 g by mouth daily.   potassium chloride SA 20 MEQ tablet Commonly known as: K-DUR Take 4 tablets (80 meq) by mouth twice daily, take an extra 1 tablet (20 meq) on the days you take metolazone What changed:   how much to take  how to take this  when to take this  additional instructions   predniSONE 10 MG tablet Commonly known as: DELTASONE Take 1 tablet (10 mg total) by mouth daily with breakfast.   Symbicort 160-4.5 MCG/ACT inhaler Generic drug: budesonide-formoterol USE 2 INHALATIONS TWICE A DAY What changed: See the new instructions.   tamsulosin 0.4 MG Caps capsule Commonly known as: FLOMAX TAKE 1 CAPSULE DAILY   torsemide 20 MG tablet Commonly known as: DEMADEX Take 40 mg by mouth 2 (two) times daily.   traZODone 50 MG tablet Commonly known as: DESYREL Take 2 tablets (100 mg total) by mouth at bedtime. What changed:   when to take this  reasons to take this       Today   VITAL SIGNS:  Blood pressure 115/83, pulse 91, temperature 97.8 F (36.6 C), temperature source Oral, resp. rate 18, height 5\' 6"  (1.676 m), weight 84.8 kg, SpO2 95 %.  I/O:    Intake/Output Summary (Last 24 hours) at 10/10/2018 1407 Last data filed at 10/09/2018 1700 Gross per 24 hour  Intake 0 ml  Output -  Net 0 ml    PHYSICAL EXAMINATION:  Physical Exam  GENERAL:  85 y.o.-year-old [name] lying in the bed with no acute distress.  LUNGS: Normal breath sounds bilaterally, no wheezing, rales,rhonchi or crepitation. No use of accessory muscles of respiration.  CARDIOVASCULAR: S1, S2 normal. No murmurs, rubs, or gallops.  ABDOMEN: Soft, non-tender,  non-distended. Bowel sounds present. No organomegaly or mass.  NEUROLOGIC: Moves all 4 extremities. PSYCHIATRIC: The [name] is alert and oriented  x 3.  SKIN: No obvious rash, lesion, or ulcer.  Left BKA  DATA REVIEW:   CBC Recent Labs  Lab 10/08/18 0705  WBC 11.2*  HGB 11.2*  HCT 36.1*  PLT 322    Chemistries  Recent Labs  Lab 10/06/18 0504 10/07/18 0647  NA 141 140  K 4.9 3.8  CL 106 108  CO2 26 25  GLUCOSE 109* 77  BUN 28* 22  CREATININE 1.34* 1.08  CALCIUM 9.3 9.1  MG 2.1  --     Cardiac Enzymes No results for input(s): TROPONINI in the last 168 hours.  Microbiology Results  Results for orders placed or performed during the hospital encounter of 10/03/18  SARS Coronavirus 2 [facility] order, Performed in [facility] hospital lab)     Status: None   Collection Time: 10/03/18  8:10 PM   Specimen: Nasopharyngeal Swab  Result Value Ref Range Status   SARS Coronavirus 2 NEGATIVE NEGATIVE Final    Comment: (NOTE) If result is NEGATIVE SARS-CoV-2 target nucleic acids are NOT DETECTED. The SARS-CoV-2 RNA is generally detectable in upper and lower  respiratory specimens during the acute phase of infection. The lowest  concentration of SARS-CoV-2 viral copies this assay can detect is 250  copies / mL. A negative result does not preclude SARS-CoV-2 infection  and should not be used as the sole basis for treatment or other  [name] management decisions.  A negative result may occur with  improper specimen collection / handling, submission of specimen other  than nasopharyngeal swab, presence of viral mutation(s) within the  areas targeted by this assay, and inadequate number of viral copies  (<250 copies / mL). A negative result must be combined with clinical  observations, [name] history, and epidemiological information. If result is POSITIVE SARS-CoV-2 target nucleic acids are DETECTED. The SARS-CoV-2 RNA is generally detectable in upper and lower  respiratory  specimens dur ing the acute phase of infection.  Positive  results are indicative of active infection with SARS-CoV-2.  Clinical  correlation with [name] history and other diagnostic information is  necessary to determine [name] infection status.  Positive results do  not rule out bacterial infection or co-infection with other viruses. If result is PRESUMPTIVE POSTIVE SARS-CoV-2 nucleic acids MAY BE PRESENT.   A presumptive positive result was obtained on the submitted specimen  and confirmed on repeat testing.  While 2019 novel coronavirus  (SARS-CoV-2) nucleic acids may be present in the submitted sample  additional confirmatory testing may be necessary for epidemiological  and / or clinical management purposes  to differentiate between  SARS-CoV-2 and other Sarbecovirus currently known to infect humans.  If clinically indicated additional testing with an alternate test  methodology [phone]) is advised. The SARS-CoV-2 RNA is generally  detectable in upper and lower respiratory sp ecimens during the acute  phase of infection. The expected result is Negative. Fact Sheet for Patients:  StrictlyIdeas.no Fact Sheet for Healthcare Providers: BankingDealers.co.za This test is not yet approved or cleared by the [place] FDA and has been authorized for detection and/or diagnosis of SARS-CoV-2 by FDA under an Emergency Use Authorization (EUA).  This EUA will remain in effect (meaning this test can be used) for the duration of the COVID-19 declaration under Section 564(b)(1) of the Act, 21 U.S.C. section 360bbb-3(b)(1), unless the authorization is terminated or revoked sooner. Performed at [facility], [address] [place], [place] [address]   Surgical PCR screen     Status: None  Collection Time: 10/05/18  7:16 PM   Specimen: Nasal Mucosa; Nasal Swab  Result Value Ref Range Status   MRSA, PCR NEGATIVE NEGATIVE Final    Staphylococcus aureus NEGATIVE NEGATIVE Final    Comment: (NOTE) The Xpert SA Assay (FDA approved for NASAL specimens in patients 67 years of age and older), is one component of a comprehensive surveillance program. It is not intended to diagnose infection nor to guide or monitor treatment. Performed at [facility], [address], [place],  [address]     RADIOLOGY:  Bea Graff 2 Views  Result Date: 10/09/2018 CLINICAL DATA:  Status post below the knee amputation. Abdominal distension EXAM: ABDOMEN - 2 VIEW COMPARISON:  None. FINDINGS: There is a large amount of stool throughout the colon. There is no bowel dilatation to suggest obstruction. There is no evidence of pneumoperitoneum, portal venous gas or pneumatosis. There are no pathologic calcifications along the expected course of the ureters. There are cholelithiasis. The osseous structures are unremarkable. IMPRESSION: 1. Large amount of stool throughout the colon. 2. Cholelithiasis. Electronically Signed   By: Kathreen Devoid   On: 10/09/2018 11:47    Follow up with PCP in 1 week.  Management plans discussed with the [name], family and they are in agreement.  CODE STATUS:     Code Status Orders  (From admission, onward)         Start     Ordered   10/03/18 1856  Full code  Continuous     10/03/18 1856        Code Status History    Date Active Date Inactive Code Status Order ID Comments User Context   09/10/2018 1524 09/16/2018 1749 Full Code [id number]  Otila Back, MD ED   08/25/2018 0247 08/27/2018 1640 Full Code [id number]  Lance Coon, MD Inpatient   06/09/2018 1855 06/16/2018 1756 Full Code [id number]  Demetrios Loll, MD Inpatient   04/17/2018 1341 04/18/2018 1749 Full Code [id number]  Hillary Bow, MD ED   12/22/2017 0613 12/26/2017 2049 Full Code [id number]  [name], MD Inpatient   10/10/2016 1520 10/11/2016 1829 Full Code [id number]  Demetrios Loll, MD Inpatient   10/10/2014 0258 10/11/2014 1704 Full Code [id number]  Harrie Foreman, MD Inpatient   Advance Care Planning Activity    Advance Directive Documentation     Most Recent Value  Type of Advance Directive  Healthcare Power of Attorney, Living will  Pre-existing out of facility DNR order (yellow form or pink MOST form)  -  "MOST" Form in Place?  -      TOTAL TIME TAKING CARE OF THIS [name] ON DAY OF DISCHARGE: more than 30 minutes.   [name] Ocean Schildt M.D on 10/13/2018 at 8:16 AM  Between 7am to 6pm - Pager - [phone]  After 6pm go to [email or web address] - password [email or web address] [facility]  Office  [phone]  CC: Primary care physician; Jearld Fenton, NP  Note: This dictation was prepared with Dragon dictation along with smaller phrase technology. Any transcriptional errors that result from this process are unintentional.

## 2018-10-10 NOTE — Progress Notes (Signed)
Assumed care of this patient at 2300

## 2018-10-11 DIAGNOSIS — I10 Essential (primary) hypertension: Secondary | ICD-10-CM

## 2018-10-11 LAB — SURGICAL PATHOLOGY

## 2018-10-11 LAB — CBC
HCT: 38.6 % — ABNORMAL LOW (ref 39.0–52.0)
Hemoglobin: 11.9 g/dL — ABNORMAL LOW (ref 13.0–17.0)
MCH: 28.5 pg (ref 26.0–34.0)
MCHC: 30.8 g/dL (ref 30.0–36.0)
MCV: 92.3 fL (ref 80.0–100.0)
Platelets: 350 10*3/uL (ref 150–400)
RBC: 4.18 MIL/uL — ABNORMAL LOW (ref 4.22–5.81)
RDW: 17.2 % — ABNORMAL HIGH (ref 11.5–15.5)
WBC: 18.6 10*3/uL — ABNORMAL HIGH (ref 4.0–10.5)
nRBC: 0.2 % (ref 0.0–0.2)

## 2018-10-11 LAB — GLUCOSE, CAPILLARY
Glucose-Capillary: 121 mg/dL — ABNORMAL HIGH (ref 70–99)
Glucose-Capillary: 364 mg/dL — ABNORMAL HIGH (ref 70–99)
Glucose-Capillary: 335 mg/dL — ABNORMAL HIGH (ref 70–99)
Glucose-Capillary: 142 mg/dL — ABNORMAL HIGH (ref 70–99)
Glucose-Capillary: 184 mg/dL — ABNORMAL HIGH (ref 70–99)

## 2018-10-11 MED ORDER — POLYETHYLENE GLYCOL 3350 17 G PO PACK
17.0000 g | PACK | Freq: Two times a day (BID) | ORAL | Status: DC
  Administered 2018-10-11 – 2018-10-13 (×3): 17 g via ORAL
  Filled 2018-10-11 (×4): qty 1

## 2018-10-11 MED ORDER — MAGNESIUM CITRATE PO SOLN
1.0000 | Freq: Once | ORAL | Status: AC
  Administered 2018-10-11: 1 via ORAL
  Filled 2018-10-11: qty 296

## 2018-10-11 MED ORDER — METHYLNALTREXONE BROMIDE 12 MG/0.6ML ~~LOC~~ SOLN
8.0000 mg | Freq: Once | SUBCUTANEOUS | Status: AC
  Administered 2018-10-11: 8 mg via SUBCUTANEOUS
  Filled 2018-10-11: qty 0.6

## 2018-10-11 NOTE — Progress Notes (Signed)
[facility] Daily Progress Note   Subjective: 5 Days Post-Op: Left below-the-knee amputation  Patient drowsy but easily arousable. No issues overnight. Continued pain at the stump site.   Objective: Vitals:   10/10/18 1528 10/11/18 0011 10/11/18 0837 10/11/18 1024  BP: (!) 119/42 120/67 (!) 81/68 (!) 119/56  Pulse: 89 91 90   Resp: 18 18 17    Temp: 98.2 F (36.8 C) 97.8 F (36.6 C) 97.8 F (36.6 C)   TempSrc:      SpO2: 96% 94% 94%   Weight:      Height:        Intake/Output Summary (Last 24 hours) at 10/11/2018 1245 Last data filed at 10/11/2018 0900 Gross per 24 hour  Intake 720 ml  Output -  Net 720 ml   Physical Exam: A&Ox3, NAD CV: RRR Pulmonary: CTA Bilaterally Abdomen: Soft, Nontender, Nondistended Vascular:   Left Lower Extremity: Dressing removed Thigh soft. Calf soft. Knee flexible at the joint. Staples: clean, dry and intact. Stump healthy. No drainage, erythema or ecchymosis noted.    Document Information Photos    10/11/2018 12:40  Attached To:  Hospital Encounter on 10/03/18  Source Information Lalo Tromp, Janalyn Harder, PA-C  [facility] (1a)   Laboratory: CBC    Component Value Date/Time   WBC 18.6 (H) 10/11/2018 0242   HGB 11.9 (L) 10/11/2018 0242   HGB 13.3 08/05/2014 1839   HCT 38.6 (L) 10/11/2018 0242   HCT 40.1 08/05/2014 1839   PLT 350 10/11/2018 0242   PLT 246 08/05/2014 1839   BMET    Component Value Date/Time   NA 140 10/10/2018 1826   NA 143 02/21/2015 1108   NA 137 08/05/2014 1839   K 4.3 10/10/2018 1826   K 3.6 08/05/2014 1839   CL 101 10/10/2018 1826   CL 106 08/05/2014 1839   CO2 28 10/10/2018 1826   CO2 23 08/05/2014 1839   GLUCOSE 226 (H) 10/10/2018 1826   GLUCOSE 235 (H) 08/05/2014 1839   BUN 36 (H) 10/10/2018 1826   BUN 26 02/21/2015 1108   BUN 19 08/05/2014 1839   CREATININE 1.13 10/10/2018 1826   CREATININE 1.20 08/05/2014 1839   CALCIUM 9.6 10/10/2018 1826   CALCIUM 8.6 (L)  08/05/2014 1839   GFRNONAA 59 (L) 10/10/2018 1826   GFRNONAA 57 (L) 08/05/2014 1839   GFRAA >60 10/10/2018 1826   GFRAA >60 08/05/2014 1839   Assessment/Planning: The patient is an 85 year old male with a past medical history ofhypertension, hyperlipidemia, GERD, diabetes, CVA(1999, 2016),COPD, congestive heart failure, chronic atrial fibrillation, lung cancer, coronary artery disease, arthritis, peripheral artery disease with a chronic left foot wound s/p Left BKA POD#5 1) Doing well. Stump healing without complication 2) H&H stable 3) PT and OT recommending SNF for on discharge - both patient and daughter are in agreement 4) OK from vascular standpoint to discharge to SNF when medically stable.  5) Constipation and leukocytosis holding discharge  Discussed with Dr. [facility] PA-C 10/11/2018 12:45 PM

## 2018-10-11 NOTE — Progress Notes (Signed)
PT Cancellation Note  Patient Details Name: [name]. MRN: [id number] DOB: 1931/04/18   Cancelled Treatment:    Reason Eval/Treat Not Completed: Other (comment). 2nd attempt. Pt still lethargic, vomiting sputum, foamy. RN notified. Pt reports he feels sick and hasn't been able to keep anything down. Will re-attempt next date if able to participate.   [name] 10/11/2018, 1:24 PM [name], PT, DPT [phone]

## 2018-10-11 NOTE — TOC Progression Note (Signed)
Transition of Care (TOC) - Progression Note    Patient Details  Name: [name]. MRN: [id number] Date of Birth: 04/05/1932  Transition of Care [facility]) CM/SW Contact  Su Hilt, RN Phone Number: 10/11/2018, 10:05 AM  Clinical Narrative:    Otila Kluver from [facility] called and stated that they could take the patient today to room 706 if ready to DC   Expected Discharge Plan: [place] Barriers to Discharge: Continued Medical Work up  Expected Discharge Plan and Services Expected Discharge Plan: [place] In-house Referral: Clinical Social Work Discharge Planning Services: CM Consult   Living arrangements for the past 2 months: Single Family Home                                       Social Determinants of Health (SDOH) Interventions    Readmission Risk Interventions Readmission Risk Prevention Plan 09/16/2018  Transportation Screening Complete  Medication Review Press photographer) Complete  HRI or [place] Complete  SW Recovery Care/Counseling Consult Patient refused  Palliative Care Screening Patient [place] Patient Refused  Some recent data might be hidden

## 2018-10-11 NOTE — Progress Notes (Signed)
[facility] [place] NAME: [name]    MR#:  [id number]   DATE OF BIRTH:  1931/06/19  SUBJECTIVE:   No bowel movement yet.  Afebrile.  No shortness of breath, chest pain or abdominal pain  REVIEW OF SYSTEMS:    Review of Systems  Constitutional: Negative for fever, chills weight loss HENT: Negative for ear pain, nosebleeds, congestion, facial swelling, rhinorrhea, neck pain, neck stiffness and ear discharge.   Respiratory: Negative for cough, shortness of breath, wheezing  Cardiovascular: Negative for chest pain, palpitations and leg swelling.  Gastrointestinal: Negative for heartburn, abdominal pain, vomiting, diarrhea. Genitourinary: Negative for dysuria, urgency, frequency, hematuria Musculoskeletal: Negative for back pain  ++BKA stump pain Neurological: Negative for dizziness, seizures, syncope, focal weakness,  numbness and headaches.  Hematological: Does not bruise/bleed easily.  Psychiatric/Behavioral: Negative for hallucinations, confusion, dysphoric mood Tolerating Diet: yes  DRUG ALLERGIES:   Allergies  Allergen Reactions  . Contrast Media [Iodinated Diagnostic Agents] Shortness Of Breath  . Morphine And Related Other (See Comments)    Hallucinations   . Niacin And Related Dermatitis  . Other     Other reaction(s): SHORTNESS OF BREATH  . Amlodipine Rash    VITALS:  Blood pressure (!) 119/56, pulse 90, temperature 97.8 F (36.6 C), resp. rate 17, height 5\' 6"  (1.676 m), weight 84.8 kg, SpO2 94 %.  PHYSICAL EXAMINATION:  Constitutional: Appears well-developed and well-nourished. No distress. HENT: Normocephalic. Marland Kitchen Oropharynx is clear and moist.  Eyes: Conjunctivae and EOM are normal. PERRLA, no scleral icterus.  Neck: Normal ROM. Neck supple. No JVD. No tracheal deviation. CVS: RRR, S1/S2 +, no murmurs, no gallops, no carotid bruit.  Pulmonary: Effort and breath sounds normal, no stridor, rhonchi, wheezes, rales.   Abdominal: Soft. BS +,  no distension, tenderness, rebound or guarding.  Musculoskeletal: Normal range of motion. No edema and no tenderness.  Neuro: Alert. CN 2-12 grossly intact. No focal deficits. Skin: Left BKA dressed Dressing without much discharge   psychiatric: Normal mood and affect.   LABORATORY PANEL:   CBC Recent Labs  Lab 10/11/18 0242  WBC 18.6*  HGB 11.9*  HCT 38.6*  PLT 350   ------------------------------------------------------------------------------------------------------------------  Chemistries  Recent Labs  Lab 10/06/18 0504  10/10/18 1826  NA 141   < > 140  K 4.9   < > 4.3  CL 106   < > 101  CO2 26   < > 28  GLUCOSE 109*   < > 226*  BUN 28*   < > 36*  CREATININE 1.34*   < > 1.13  CALCIUM 9.3   < > 9.6  MG 2.1  --   --   AST  --   --  14*  ALT  --   --  14  ALKPHOS  --   --  73  BILITOT  --   --  0.4   < > = values in this interval not displayed.   ------------------------------------------------------------------------------------------------------------------  Cardiac Enzymes No results for input(s): TROPONINI in the last 168 hours. ------------------------------------------------------------------------------------------------------------------  RADIOLOGY:  Dg Chest 2 View  Result Date: 10/10/2018 CLINICAL DATA:  85 year old male with shortness of breath. EXAM: CHEST - 2 VIEW COMPARISON:  09/12/2018 chest radiographs and earlier. FINDINGS: Seated AP and lateral views of the chest. Stable cardiomegaly and mediastinal contours. Stable lung volumes. Chronic coarse bilateral pulmonary interstitial markings greater on the left. No pneumothorax, pulmonary edema or acute pulmonary opacity. Stable visualized osseous  structures. Negative visible bowel gas pattern. IMPRESSION: Chronic interstitial lung disease. No acute cardiopulmonary abnormality. Electronically Signed   By: [name] M.D.   On: 10/10/2018 19:32   Dg Abd 2 Views  Result Date:  10/10/2018 CLINICAL DATA:  85 year old male with abdominal pain. EXAM: ABDOMEN - 2 VIEW COMPARISON:  10/09/2018.  PET-CT 03/02/2017. FINDINGS: Upright and supine views. Stable lung bases. No pneumoperitoneum. Chronic cholelithiasis and left nephrolithiasis (image 3). Stable non obstructed bowel gas pattern with a large volume of retained stool in the colon. Gas-filled but nondilated small bowel. Stable visualized osseous structures. IMPRESSION: 1. Non obstructed bowel gas pattern with abundant retained stool. No free air. 2. Chronic cholelithiasis and left nephrolithiasis. Electronically Signed   By: [name] M.D.   On: 10/10/2018 19:34     ASSESSMENT AND PLAN:   85 year old male with history of diabetes and first toe amputation with recent stent and angioplasty who presented to the hospital after being evaluated by home health nurse with concerns of incision necrosis.  *Constipation Xray showed large stool burden.  Repeat x-ray showed the same.  No obstruction or ileus Colace, miralax. Discussed with patient and daughter.  No abdominal tenderness or significant distention.   Add Relistor.  *  Diabetic left leg and foot infection POD #5 left below-knee amputation by vascular surgery.  Continue pain control  Wound healing satisfactory per vascular team Skilled nursing facility upon discharge  *  Diabetes: Continue Levemir , increase pre-meal NovoLog, moderate sliding scale and ADA diet   *  Hypothyroidism: Continue Synthroid  *  Essential hypertension: Continue metoprolol  *  BPH: Continue Proscar and tamsulosin  *  Glaucoma: Continue eyedrops  *  Chronic atrial fibrillation: Currently in normal sinus rhythm. Continue Eliquis.  Management plans discussed with the patient and daughter and they are in agreement.  CODE STATUS: FULL  TOTAL TIME TAKING CARE OF THIS PATIENT: 35 minutes.   [name] M.D on 10/11/2018 at 12:27 PM  Between 7am to 6pm - Pager -  [phone]  After 6pm go to [email or web address] - password [email or web address] [facility]  Office  [phone]  CC: Primary care physician; Shawn Fenton, NP  Note: This dictation was prepared with Dragon dictation along with smaller phrase technology. Any transcriptional errors that result from this process are unintentional.

## 2018-10-11 NOTE — Progress Notes (Signed)
PT Cancellation Note  Patient Details Name: [name]. MRN: [id number] DOB: [birth year]   Cancelled Treatment:    Reason Eval/Treat Not Completed: Other (comment). Treatment attempted, however pt sleeping upon arrival. Awakens with voice, however unable to stay awake for participation. Requested for therapy to return later today. Will re-attempt. Discussed with RN.   Khadija Thier 10/11/2018, 12:11 PM Greggory Stallion, PT, DPT [phone]

## 2018-10-11 NOTE — Care Management Important Message (Signed)
Important Message  Patient Details  Name: [name]. MRN: [id number] Date of Birth: 1931/04/14   Medicare Important Message Given:  Yes     [name] 10/11/2018, 11:58 AM

## 2018-10-11 NOTE — Progress Notes (Signed)
Advance care planning  Purpose of Encounter Left BKA, constipation  Parties in Attendance Patient, daughter [name]  Patients Decisional capacity Patient is alert and oriented.  Able to make medical decisions.  Has been more forgetful here in the hospital. Daughter is the primary caregiver.  No documented healthcare power of attorney.  Discussed in detail regarding left BKA, constipation.  Treatment plan , prognosis discussed.  All questions answered  CODE STATUS discussed and plan is for aggressive medical care with CPR/defibrillation/intubation if needed.  Orders entered.  FULL CODE  Time spent - 17 minutes

## 2018-10-11 NOTE — Consult Note (Addendum)
[place] Nurse wound consult note Reason for Consult: Consult requested for right arm. He is familiar to [facility] team from a previous admission in May, when he developed a full thickness skin tear.  He has been followed by home health for wound care, according to his daughter at the bedside.  Wound type: Right lower arm with healing full thickness wound, 2X2X.1cm, dry red woundbed, no odor or drainage, surrounded by dry peeling scabbed areas.  Right upper arm with healing full thickness wound, 8X10X.1cm, dry red woundbed, no odor or drainage, surrounded by dry peeling scabbed areas.  Loose dry scabs remove easily when soaked with a moist washcloth, revealing dry red woundbed, no odor, drainage, or bleeding.  Dressing procedure/placement/frequency: Xeroform gauze to promote healing and decrease adherence and discomfort with dressing changes.   Please re-consult if further assistance is needed.  Thank-you,  Julien Girt MSN, [place], [place], [place], [place]

## 2018-10-11 NOTE — Progress Notes (Signed)
Nutrition Follow-up  RD working remotely.  DOCUMENTATION CODES:   Not applicable  INTERVENTION:  Continue Ensure Max Protein po BID, each supplement provides 150 kcal and 30 grams of protein.  Continue Ocuvite daily for wound healing (provides zinc, vitamin A, vitamin C, Vitamin E, copper, and selenium).  Continue vitamin C 250 mg BID.  NUTRITION DIAGNOSIS:   Increased nutrient needs related to wound healing as evidenced by estimated needs.  Ongoing.  GOAL:   Patient will meet greater than or equal to 90% of their needs  Progressing.  MONITOR:   PO intake, Supplement acceptance, Labs, Weight trends, Skin, I & O's  REASON FOR ASSESSMENT:   Consult Wound healing  ASSESSMENT:   85 y/o male with h/o DM, CHF and MI admitted with L diabetic foot wound and now s/p L BKA 6/26  Called patient's room for nutrition follow-up but a family member answered and wanted to provide history. She reported patient was nauseous and did not feel like talking. She reports patient has had decreased PO intake. He is eating bites/sips related to the nausea and constipation. However, per chart patient is finishing 100% of meals - family may actually be finishing the trays.  Medications reviewed and include: Xanax 0.25 mg QHS, Eliquis, Dulcolax 10 mg daily RE, Colace 100 mg BID, gabapentin, Novolog 0-15 units TID, Novolog 0-5 units QHS, Novolog 14 units TID, Levemir 32 units daily, levothyroxine, Ocuvite daily, Miralax 17 grams daily, potassium chloride 40 mEq BID, prednisone 10 mg daily, Ultram, vitamin C 250 mg BID.  Labs reviewed: CBG 121-364.  Diet Order:   Diet Order            Diet Carb Modified Fluid consistency: Thin; Room service appropriate? Yes  Diet effective now             EDUCATION NEEDS:   Not appropriate for education at this time  Skin:  Skin Assessment: Reviewed RN Assessment(incision L leg s/ BKA)  Last BM:  10/05/2018 per chart  Height:   Ht Readings from Last 1  Encounters:  10/03/18 5\' 6"  (1.676 m)   Weight:   Wt Readings from Last 1 Encounters:  10/03/18 84.8 kg   Ideal Body Weight:  64.5 kg  BMI:  Body mass index is 30.17 kg/m.  Estimated Nutritional Needs:   Kcal:  1800-2100kcal/day  Protein:  90-105g/day  Fluid:  >1.6L/day  Willey Blade, MS, RD, LDN Office: [phone] Pager: [phone] After Hours/Weekend Pager: [phone]

## 2018-10-11 NOTE — Plan of Care (Signed)

## 2018-10-12 ENCOUNTER — Inpatient Hospital Stay: Payer: Medicare Other

## 2018-10-12 LAB — CBC WITH DIFFERENTIAL/PLATELET
Abs Immature Granulocytes: 0.28 10*3/uL — ABNORMAL HIGH (ref 0.00–0.07)
Basophils Absolute: 0.1 10*3/uL (ref 0.0–0.1)
Basophils Relative: 0 %
Eosinophils Absolute: 0 10*3/uL (ref 0.0–0.5)
Eosinophils Relative: 0 %
HCT: 37.5 % — ABNORMAL LOW (ref 39.0–52.0)
Hemoglobin: 11.4 g/dL — ABNORMAL LOW (ref 13.0–17.0)
Immature Granulocytes: 2 %
Lymphocytes Relative: 16 %
Lymphs Abs: 2.8 10*3/uL (ref 0.7–4.0)
MCH: 28.7 pg (ref 26.0–34.0)
MCHC: 30.4 g/dL (ref 30.0–36.0)
MCV: 94.5 fL (ref 80.0–100.0)
Monocytes Absolute: 2.1 10*3/uL — ABNORMAL HIGH (ref 0.1–1.0)
Monocytes Relative: 12 %
Neutro Abs: 11.9 10*3/uL — ABNORMAL HIGH (ref 1.7–7.7)
Neutrophils Relative %: 70 %
Platelets: 323 10*3/uL (ref 150–400)
RBC: 3.97 MIL/uL — ABNORMAL LOW (ref 4.22–5.81)
RDW: 17.2 % — ABNORMAL HIGH (ref 11.5–15.5)
WBC: 17.1 10*3/uL — ABNORMAL HIGH (ref 4.0–10.5)
nRBC: 0.2 % (ref 0.0–0.2)

## 2018-10-12 LAB — GLUCOSE, CAPILLARY
Glucose-Capillary: 128 mg/dL — ABNORMAL HIGH (ref 70–99)
Glucose-Capillary: 174 mg/dL — ABNORMAL HIGH (ref 70–99)
Glucose-Capillary: 116 mg/dL — ABNORMAL HIGH (ref 70–99)
Glucose-Capillary: 95 mg/dL (ref 70–99)

## 2018-10-12 LAB — SARS CORONAVIRUS 2 BY RT PCR (HOSPITAL ORDER, PERFORMED IN ~~LOC~~ HOSPITAL LAB): SARS Coronavirus 2: NEGATIVE

## 2018-10-12 MED ORDER — MAGNESIUM CITRATE PO SOLN
1.0000 | Freq: Once | ORAL | Status: DC
  Filled 2018-10-12: qty 296

## 2018-10-12 MED ORDER — BARIUM SULFATE 2 % PO SUSP
450.0000 mL | ORAL | Status: AC
  Administered 2018-10-12 (×2): 450 mL via ORAL

## 2018-10-12 NOTE — Progress Notes (Signed)
OT Cancellation Note  Patient Details Name: [name]. MRN: [id number] DOB: [birth year]   Cancelled Treatment:    Reason Eval/Treat Not Completed: Patient declined, no reason specified(Pt. With eyes closed upon arrival, intermittently opening them. Pt. is on morphine for pain. Pt. and daughter report pt. has had nausea/vomiting, and has not had a  bowel movement in several days. Pt. Declined attempt for intevention today. Will continue to monitor, and intervene at a later date as pt. is feeling better.)  Harrel Carina, MS, OTR/L 10/12/2018, 11:13 AM

## 2018-10-12 NOTE — Progress Notes (Addendum)
Physical Therapy Treatment Patient Details Name: [name]. MRN: [id number] DOB: February 13, 1932 Today's Date: 10/12/2018    History of Present Illness admitted for acute hospitalization status post L BKA (10/06/18)    PT Comments    Pt is making limited progress however limited by just receiving suppository and only wishes there-ex at this time. Reports nausea is improving. R knee flexion contracture at 40 degrees. Extensive education given to improve extension, placed on towel roll. Will continue to progress as able.   Follow Up Recommendations  SNF     Equipment Recommendations       Recommendations for Other Services       Precautions / Restrictions Precautions Precautions: Fall Precaution Comments: R post-op shoe Restrictions Weight Bearing Restrictions: Yes LLE Weight Bearing: Non weight bearing    Mobility  Bed Mobility               General bed mobility comments: not performed as pt reports he just received suppository and prefers not to perform mobility at this time  Transfers                 General transfer comment: not performed  Ambulation/Gait                 Stairs             Wheelchair Mobility    Modified Rankin (Stroke Patients Only)       Balance                                            Cognition Arousal/Alertness: Awake/alert Behavior During Therapy: WFL for tasks assessed/performed Overall Cognitive Status: Within Functional Limits for tasks assessed                                        Exercises Other Exercises Other Exercises: supine ther-ex performed on B LE including SLRs, hip abd/add, and SAQ (R LE). 12 reps with supervision. Also educated on L quad sets.    General Comments General comments (skin integrity, edema, etc.): starting to notice L knee flexion contracture. Placed rolled blanket under residual limb to enhance extension and long stretch       Pertinent Vitals/Pain Pain Assessment: No/denies pain    Home Living                      Prior Function            PT Goals (current goals can now be found in the care plan section) Acute Rehab PT Goals Patient Stated Goal: to get stronger PT Goal Formulation: With patient Time For Goal Achievement: 10/21/18 Potential to Achieve Goals: Fair Progress towards PT goals: Progressing toward goals    Frequency    7X/week      PT Plan Current plan remains appropriate    Co-evaluation              AM-PAC PT "6 Clicks" Mobility   Outcome Measure  Help needed turning from your back to your side while in a flat bed without using bedrails?: A Little Help needed moving from lying on your back to sitting on the side of a flat bed without using bedrails?: A Little Help needed moving to and from  a bed to a chair (including a wheelchair)?: A Lot Help needed standing up from a chair using your arms (e.g., wheelchair or bedside chair)?: A Lot Help needed to walk in hospital room?: Total Help needed climbing 3-5 steps with a railing? : Total 6 Click Score: 12    End of Session   Activity Tolerance: Patient limited by fatigue Patient left: in bed;with call bell/phone within reach;with bed alarm set Nurse Communication: Mobility status PT Visit Diagnosis: Unsteadiness on feet (R26.81);Repeated falls (R29.6);History of falling (Z91.81);Difficulty in walking, not elsewhere classified (R26.2)     Time: 2230-0979 PT Time Calculation (min) (ACUTE ONLY): 14 min  Charges:  $Therapeutic Exercise: 8-22 mins                     Greggory Stallion, PT, DPT [phone]    [name] 10/12/2018, 12:36 PM

## 2018-10-12 NOTE — Progress Notes (Signed)
[place] at [place] NAME: [name]    MR#:  [id number]   DATE OF BIRTH:  Aug 31, 1931  SUBJECTIVE:   No bowel movement yet.   Nausea present.  No abdominal pain.  Passing gas.  REVIEW OF SYSTEMS:    Review of Systems  Constitutional: Negative for fever, chills weight loss HENT: Negative for ear pain, nosebleeds, congestion, facial swelling, rhinorrhea, neck pain, neck stiffness and ear discharge.   Respiratory: Negative for cough, shortness of breath, wheezing  Cardiovascular: Negative for chest pain, palpitations and leg swelling.  Gastrointestinal: Negative for heartburn, abdominal pain, vomiting, diarrhea. Genitourinary: Negative for dysuria, urgency, frequency, hematuria Musculoskeletal: Negative for back pain  ++BKA stump pain Neurological: Negative for dizziness, seizures, syncope, focal weakness,  numbness and headaches.  Hematological: Does not bruise/bleed easily.  Psychiatric/Behavioral: Negative for hallucinations, confusion, dysphoric mood Tolerating Diet: yes  DRUG ALLERGIES:   Allergies  Allergen Reactions  . Contrast Media [Iodinated Diagnostic Agents] Shortness Of Breath  . Morphine And Related Other (See Comments)    Hallucinations   . Niacin And Related Dermatitis  . Other     Other reaction(s): SHORTNESS OF BREATH  . Amlodipine Rash    VITALS:  Blood pressure 124/81, pulse 88, temperature 98 F (36.7 C), temperature source Oral, resp. rate 18, height 5\' 6"  (1.676 m), weight 84.8 kg, SpO2 97 %.  PHYSICAL EXAMINATION:  Constitutional: Appears well-developed and well-nourished. No distress. HENT: Normocephalic. Marland Kitchen Oropharynx is clear and moist.  Eyes: Conjunctivae and EOM are normal. PERRLA, no scleral icterus.  Neck: Normal ROM. Neck supple. No JVD. No tracheal deviation. CVS: RRR, S1/S2 +, no murmurs, no gallops, no carotid bruit.  Pulmonary: Effort and breath sounds normal, no stridor, rhonchi, wheezes,  rales.  Abdominal: Soft. BS +,  no distension, tenderness, rebound or guarding.  Musculoskeletal: Normal range of motion. No edema and no tenderness.  Neuro: Alert. CN 2-12 grossly intact. No focal deficits. Skin: Left BKA dressed Dressing without much discharge   psychiatric: Normal mood and affect.   LABORATORY PANEL:   CBC Recent Labs  Lab 10/12/18 0333  WBC 17.1*  HGB 11.4*  HCT 37.5*  PLT 323   ------------------------------------------------------------------------------------------------------------------  Chemistries  Recent Labs  Lab 10/06/18 0504  10/10/18 1826  NA 141   < > 140  K 4.9   < > 4.3  CL 106   < > 101  CO2 26   < > 28  GLUCOSE 109*   < > 226*  BUN 28*   < > 36*  CREATININE 1.34*   < > 1.13  CALCIUM 9.3   < > 9.6  MG 2.1  --   --   AST  --   --  14*  ALT  --   --  14  ALKPHOS  --   --  73  BILITOT  --   --  0.4   < > = values in this interval not displayed.   ------------------------------------------------------------------------------------------------------------------  Cardiac Enzymes No results for input(s): TROPONINI in the last 168 hours. ------------------------------------------------------------------------------------------------------------------  RADIOLOGY:  Dg Chest 2 View  Result Date: 10/10/2018 CLINICAL DATA:  85 year old male with shortness of breath. EXAM: CHEST - 2 VIEW COMPARISON:  09/12/2018 chest radiographs and earlier. FINDINGS: Seated AP and lateral views of the chest. Stable cardiomegaly and mediastinal contours. Stable lung volumes. Chronic coarse bilateral pulmonary interstitial markings greater on the left. No pneumothorax, pulmonary edema or acute pulmonary opacity. Stable visualized  osseous structures. Negative visible bowel gas pattern. IMPRESSION: Chronic interstitial lung disease. No acute cardiopulmonary abnormality. Electronically Signed   By: [name] M.D.   On: 10/10/2018 19:32   Dg Abd 2 Views  Result  Date: 10/10/2018 CLINICAL DATA:  85 year old male with abdominal pain. EXAM: ABDOMEN - 2 VIEW COMPARISON:  10/09/2018.  PET-CT 03/02/2017. FINDINGS: Upright and supine views. Stable lung bases. No pneumoperitoneum. Chronic cholelithiasis and left nephrolithiasis (image 3). Stable non obstructed bowel gas pattern with a large volume of retained stool in the colon. Gas-filled but nondilated small bowel. Stable visualized osseous structures. IMPRESSION: 1. Non obstructed bowel gas pattern with abundant retained stool. No free air. 2. Chronic cholelithiasis and left nephrolithiasis. Electronically Signed   By: [name] M.D.   On: 10/10/2018 19:34     ASSESSMENT AND PLAN:   85 year old male with history of diabetes and first toe amputation with recent stent and angioplasty who presented to the hospital after being evaluated by home health nurse with concerns of incision necrosis.  *Constipation Xray showed large stool burden.  Repeat x-ray showed the same.  No obstruction or ileus Colace, miralax. Discussed with patient and daughter.  No abdominal tenderness or significant distention.   Relistor given Rectal exam with no stool in the rectal vault. Ordered CT scan of the abdomen and pelvis to assess for any lesions causing obstruction.  *  Diabetic left leg and foot infection POD #6 left below-knee amputation by vascular surgery.  Continue pain control  Wound healing satisfactory per vascular team Skilled nursing facility upon discharge  *  Diabetes: Continue Levemir ,  pre-meal NovoLog, moderate sliding scale and ADA diet   *  Hypothyroidism: Continue Synthroid  *  Essential hypertension: Continue metoprolol  *  BPH: Continue Proscar and tamsulosin  *  Glaucoma: Continue eyedrops  *  Chronic atrial fibrillation: Currently in normal sinus rhythm. Continue Eliquis.  Management plans discussed with the patient and daughter and they are in agreement.  CODE STATUS: FULL  TOTAL TIME  TAKING CARE OF THIS PATIENT: 35 minutes.   [name] M.D on 10/12/2018 at 1:22 PM  Between 7am to 6pm - Pager - [phone]  After 6pm go to [email or web address] - password [email or web address] [place] [facility]  Office  [phone]  CC: Primary care physician; Shawn Fenton, NP  Note: This dictation was prepared with Dragon dictation along with smaller phrase technology. Any transcriptional errors that result from this process are unintentional.

## 2018-10-13 ENCOUNTER — Inpatient Hospital Stay: Payer: Medicare Other

## 2018-10-13 LAB — CREATININE, SERUM
Creatinine, Ser: 1.27 mg/dL — ABNORMAL HIGH (ref 0.61–1.24)
GFR calc Af Amer: 60 mL/min — ABNORMAL LOW (ref >60)
GFR calc non Af Amer: 52 mL/min — ABNORMAL LOW (ref >60)

## 2018-10-13 LAB — GLUCOSE, CAPILLARY
Glucose-Capillary: 230 mg/dL — ABNORMAL HIGH (ref 70–99)
Glucose-Capillary: 146 mg/dL — ABNORMAL HIGH (ref 70–99)
Glucose-Capillary: 174 mg/dL — ABNORMAL HIGH (ref 70–99)

## 2018-10-13 MED ORDER — LEVOFLOXACIN 250 MG PO TABS: 250.0000 mg | ORAL_TABLET | Freq: Every day | ORAL | 0 refills | Status: DC

## 2018-10-13 MED ORDER — LEVOFLOXACIN IN D5W 500 MG/100ML IV SOLN
500.0000 mg | Freq: Once | INTRAVENOUS | Status: DC
  Filled 2018-10-13 (×2): qty 100

## 2018-10-13 MED ORDER — MAGNESIUM CITRATE PO SOLN: 1.0000 | Freq: Every day | ORAL | 0 refills | Status: DC | PRN

## 2018-10-13 MED ORDER — TRAZODONE HCL 50 MG PO TABS: 100.0000 mg | ORAL_TABLET | Freq: Every day | ORAL | 0 refills | Status: DC

## 2018-10-13 NOTE — Progress Notes (Signed)
Called report to [place] at [facility]. IV removed and belongings packed. NT prepared patient for transport via ems. EMS picked up patient.

## 2018-10-13 NOTE — TOC Transition Note (Signed)
Transition of Care [facility]) - CM/SW Discharge Note   Patient Details  Name: [name]. MRN: [id number] Date of Birth: 1931-08-01  [facility] of [facility]) CM/SW Contact:  Su Hilt, RN Phone Number: 10/13/2018, 9:08 AM   Clinical Narrative:    Partient to discharge to [facility] room 706 [name] ia aware and will go to [facility] to fill out the papers and pay copay The covid test was negative and the patient is ready to DC The bed side nurse to call report to [facility] main number and to call EMS for transport   Final next level of care: Skilled Nursing Facility Barriers to Discharge: Barriers Resolved   Patient Goals and CMS Choice        Discharge Placement                       Discharge Plan and Services In-house Referral: Clinical Social Work Discharge Planning Services: CM Consult                                 Social Determinants of Health (SDOH) Interventions     Readmission Risk Interventions Readmission Risk Prevention Plan 09/16/2018  Transportation Screening Complete  Medication Review [employer]) Complete  HRI or [place] Complete  SW Recovery Care/Counseling Consult Patient refused  Palliative Care Screening Patient [place] Patient Refused  Some recent data might be hidden

## 2018-10-13 NOTE — TOC Progression Note (Signed)
Transition of Care (TOC) - Progression Note    Patient Details  Name: [name]. MRN: [id number] Date of Birth: Oct 10, 1931  Transition of Care [facility]) CM/SW Contact  Su Hilt, RN Phone Number: 10/13/2018, 9:02 AM  Clinical Narrative:    Damaris Schooner to [facility] at [facility], this patient has had a BM and is able to DC to [facility] today room 706, Physician aware   Expected Discharge Plan: [place] Barriers to Discharge: Continued Medical Work up  Expected Discharge Plan and Services Expected Discharge Plan: [place] In-house Referral: Clinical Social Work Discharge Planning Services: CM Consult   Living arrangements for the past 2 months: Single Family Home                                       Social Determinants of Health (SDOH) Interventions    Readmission Risk Interventions Readmission Risk Prevention Plan 09/16/2018  Transportation Screening Complete  Medication Review [employer]) Complete  HRI or [place] Complete  SW Recovery Care/Counseling Consult Patient refused  Palliative Care Screening Patient [place] Patient Refused  Some recent data might be hidden

## 2018-10-13 NOTE — Care Management Important Message (Signed)
Important Message  Patient Details  Name: [name]. MRN: [id number] Date of Birth: May 29, 1931   Medicare Important Message Given:  Yes     [name] 10/13/2018, 11:13 AM

## 2018-10-14 ENCOUNTER — Other Ambulatory Visit: Payer: Self-pay | Admitting: Internal Medicine

## 2018-10-23 ENCOUNTER — Encounter (INDEPENDENT_AMBULATORY_CARE_PROVIDER_SITE_OTHER): Payer: Self-pay | Admitting: Vascular Surgery

## 2018-10-23 ENCOUNTER — Other Ambulatory Visit: Payer: Self-pay

## 2018-10-23 ENCOUNTER — Ambulatory Visit (INDEPENDENT_AMBULATORY_CARE_PROVIDER_SITE_OTHER): Payer: Medicare Other | Admitting: Vascular Surgery

## 2018-10-23 VITALS — Ht 66.0 in | Wt 182.0 lb

## 2018-10-23 DIAGNOSIS — E11628 Type 2 diabetes mellitus with other skin complications: Secondary | ICD-10-CM

## 2018-10-23 DIAGNOSIS — Z89512 Acquired absence of left leg below knee: Secondary | ICD-10-CM

## 2018-10-23 DIAGNOSIS — L089 Local infection of the skin and subcutaneous tissue, unspecified: Secondary | ICD-10-CM

## 2018-10-23 NOTE — Progress Notes (Signed)
Patient ID: [name]., male   DOB: [birth year], 85 y.o.   MRN: [id number]  Chief Complaint  Patient presents with  . Follow-up    2 wk BKA post op    HPI [name]. is a 85 y.o. male.  Patient returns to the office approximately 18 days status post left below-knee amputation.  Denies pain at this point.  He is concerned about a scab-like black area over his patella.  He denies drainage from his left below-knee amputation stump.  Past Medical History:  Diagnosis Date  . Arthritis   . CAD    a. MI 01/29/1996 tx'd w/ TPA @ [place]; b. Myoview 06/2005: EF 50%, scar @ apex, mild peri-infarct ischemia  . Cancer ([place])    skin  . Chronic atrial fibrillation    a. since 2006; b. on warfarin  . Chronic diastolic CHF (congestive heart failure) ([place])    a. echo 04/2006: EF lower limits of nl, mod LVH, mild aortic root dilatation, & mild MR, biatrial enlargement; b. echo 04/2013: EF 60%, mod dilated LA, mild MR & TR, mod pulm HTN w/ RV systolic pressure 53, c. echo 04/21/14: EF 55-60%, unable to exclude WMA, severely dilated LA 6.6 cm, nl RVSP, mildly dilated aortic root  . COPD (chronic obstructive pulmonary disease) (HCC)    oxygen prn at home  . CVA 9163,8466   x2  . DM   . Falls   . GERD (gastroesophageal reflux disease)   . History of kidney stones   . HYPERLIPIDEMIA   . HYPERTENSION   . Kidney stone    a. s/p left ureteral stenting 04/24/14  . Left arm weakness    limited movement. S/P fall injury  . Neuropathy of both feet   . Poor balance   . Wears dentures    full upper and lower    Past Surgical History:  Procedure Laterality Date  . AMPUTATION Left 10/06/2018   Procedure: AMPUTATION BELOW KNEE;  Surgeon: Algernon Huxley, MD;  Location: [facility];  Service: General;  Laterality: Left;  . AMPUTATION TOE Left 06/13/2018   Procedure: 1st Ray Resection Left;  Surgeon: Sharlotte Alamo, DPM;  Location: [facility];  Service: Podiatry;  Laterality: Left;  . BLADDER SURGERY     stent placement   . CARDIAC CATHETERIZATION  1997   [facility]  . CAROTID STENT INSERTION  1997  . CATARACT EXTRACTION W/PHACO Left 10/11/2017   Procedure: CATARACT EXTRACTION PHACO AND INTRAOCULAR LENS PLACEMENT ([place]) COMPLICATED LEFT DIABETIC;  Surgeon: Leandrew Koyanagi, MD;  Location: [facility];  Service: Ophthalmology;  Laterality: Left;  MALYUGIN Diabetic - insulin  . CATARACT EXTRACTION W/PHACO Right 11/30/2017   Procedure: CATARACT EXTRACTION PHACO AND INTRAOCULAR LENS PLACEMENT ([place]) COMPLICATED  RIGHT DIABETIC;  Surgeon: Leandrew Koyanagi, MD;  Location: [facility];  Service: Ophthalmology;  Laterality: Right;  Diabetic - insulin  . CIRCUMCISION  2016  . CORONARY ANGIOPLASTY  1997   s/p stent placement x 2   . CYSTOSCOPY W/ URETERAL STENT REMOVAL Left 10/09/2014   Procedure: CYSTOSCOPY WITH STENT REMOVAL;  Surgeon: Hollice Espy, MD;  Location: [facility];  Service: Urology;  Laterality: Left;  . CYSTOSCOPY WITH STENT PLACEMENT Left 10/09/2014   Procedure: CYSTOSCOPY WITH STENT PLACEMENT;  Surgeon: Hollice Espy, MD;  Location: [facility];  Service: Urology;  Laterality: Left;  . IRRIGATION AND DEBRIDEMENT FOOT Left 09/15/2018   Procedure: IRRIGATION AND DEBRIDEMENT FOOT DELAY CLOSURE;  Surgeon: Samara Deist, DPM;  Location: [facility];  Service: Podiatry;  Laterality: Left;  . KIDNEY SURGERY  05/2013   s/p stent placement   . LOWER EXTREMITY ANGIOGRAPHY Left 06/12/2018   Procedure: Lower Extremity Angiography;  Surgeon: Algernon Huxley, MD;  Location: [facility];  Service: Cardiovascular;  Laterality: Left;  . LOWER EXTREMITY ANGIOGRAPHY Left 09/13/2018   Procedure: Lower Extremity Angiography;  Surgeon: Algernon Huxley, MD;  Location: [facility];  Service: Cardiovascular;  Laterality: Left;  . stents ureters Bilateral   . TONSILLECTOMY AND ADENOIDECTOMY  1959  . URETEROSCOPY WITH HOLMIUM LASER LITHOTRIPSY Left 10/09/2014   Procedure: URETEROSCOPY WITH HOLMIUM  LASER LITHOTRIPSY;  Surgeon: Hollice Espy, MD;  Location: [facility];  Service: Urology;  Laterality: Left;      Allergies  Allergen Reactions  . Contrast Media [Iodinated Diagnostic Agents] Shortness Of Breath  . Morphine And Related Other (See Comments)    Hallucinations   . Niacin And Related Dermatitis  . Other     Other reaction(s): SHORTNESS OF BREATH  . Amlodipine Rash    Current Outpatient Medications  Medication Sig Dispense Refill  . acetaminophen (TYLENOL) 650 MG CR tablet Take 650 mg by mouth every 8 (eight) hours as needed for pain.     Marland Kitchen albuterol (PROAIR HFA) 108 (90 Base) MCG/ACT inhaler USE 1 TO 2 INHALATIONS EVERY 4 HOURS AS NEEDED (Patient taking differently: Inhale 1-2 puffs into the lungs every 4 (four) hours as needed for wheezing or shortness of breath. ) 25.5 g 3  . ALPRAZolam (XANAX) 0.25 MG tablet Take 1 tablet (0.25 mg total) by mouth at bedtime as needed for anxiety. 5 tablet 0  . atorvastatin (LIPITOR) 40 MG tablet TAKE 1 TABLET DAILY 90 tablet 0  . citalopram (CELEXA) 10 MG tablet TAKE 1 TABLET DAILY (Patient taking differently: Take 10 mg by mouth daily. ) 90 tablet 0  . COLCRYS 0.6 MG tablet TAKE 1 TABLET TWICE A DAY AS NEEDED (ACUTE GOUT FLARE) USE AS DIRECTED (Patient taking differently: Take 0.6 mg by mouth 2 (two) times daily as needed (acute gout flare). ) 90 tablet 7  . COMBIGAN 0.2-0.5 % ophthalmic solution Place 1 drop into both eyes 2 (two) times a day.     . diclofenac sodium (VOLTAREN) 1 % GEL Apply 4 g topically 4 (four) times daily. 5 Tube 5  . docusate sodium (COLACE) 100 MG capsule Take 1 capsule (100 mg total) by mouth 2 (two) times daily. 10 capsule 0  . dorzolamide (TRUSOPT) 2 % ophthalmic solution Place 1 drop into both eyes 3 (three) times daily.     Marland Kitchen ELIQUIS 5 MG TABS tablet TAKE 1 TABLET TWICE A DAY (Patient taking differently: Take 2.5 mg by mouth 2 (two) times daily. ) 180 tablet 1  . ezetimibe (ZETIA) 10 MG tablet TAKE 1 TABLET  DAILY 90 tablet 0  . finasteride (PROSCAR) 5 MG tablet TAKE 1 TABLET DAILY (Patient taking differently: Take 5 mg by mouth daily. ) 90 tablet 4  . gabapentin (NEURONTIN) 100 MG capsule TAKE 1 CAPSULE THREE TIMES A DAY (Patient taking differently: Take 100 mg by mouth 3 (three) times daily. ) 270 capsule 1  . glipiZIDE (GLUCOTROL) 5 MG tablet Take 5 mg by mouth 2 (two) times daily before a meal.    . insulin detemir (LEVEMIR) 100 UNIT/ML injection Inject 0.28 mLs (28 Units total) into the skin daily at 10 pm. (Patient taking differently: Inject 42 Units into the skin daily  at 10 pm. ) 10 mL 11  . insulin lispro (HUMALOG) 100 UNIT/ML injection Inject 0.04 mLs (4 Units total) into the skin 3 (three) times daily with meals. (Patient taking differently: Inject 10-18 Units into the skin 3 (three) times daily with meals. 18 am, 20 lunch, 16 supper) 10 mL 11  . Insulin Pen Needle (BD PEN NEEDLE NANO U/F) 32G X 4 MM MISC USE THREE TIMES A DAY FOR INSULIN ADMINISTRATION 270 each 4  . latanoprost (XALATAN) 0.005 % ophthalmic solution Place 1 drop into both eyes at bedtime.     . SYMBICORT 160-4.5 MCG/ACT inhaler USE 2 INHALATIONS TWICE A DAY (Patient taking differently: Inhale 1 puff into the lungs 2 (two) times daily. ) 30.6 g 3  . tamsulosin (FLOMAX) 0.4 MG CAPS capsule TAKE 1 CAPSULE DAILY (Patient taking differently: Take 0.4 mg by mouth daily. ) 90 capsule 0  . torsemide (DEMADEX) 20 MG tablet Take 40 mg by mouth 2 (two) times daily.    . traZODone (DESYREL) 50 MG tablet Take 2 tablets (100 mg total) by mouth at bedtime. 180 tablet 0  . umeclidinium bromide (INCRUSE ELLIPTA) 62.5 MCG/INH AEPB Inhale 1 puff into the lungs daily.    . Insulin Syringe-Needle U-100 30G X 1/2" 1 ML MISC 1 each by Does not apply route 3 (three) times daily. 200 each 5  . levofloxacin (LEVAQUIN) 250 MG tablet Take 1 tablet (250 mg total) by mouth daily. 4 tablet 0  . levothyroxine (SYNTHROID) 50 MCG tablet TAKE 1 TABLET DAILY  BEFORE BREAKFAST (Patient taking differently: Take 50 mcg by mouth daily before breakfast. ) 90 tablet 0  . magnesium citrate SOLN Take 296 mLs (1 Bottle total) by mouth daily as needed for severe constipation. 195 mL 0  . metolazone (ZAROXOLYN) 5 MG tablet Take 1 tablet (5 mg total) by mouth daily as needed (swelling). 90 tablet 3  . metoprolol succinate (TOPROL-XL) 50 MG 24 hr tablet TAKE 1 TABLET TWICE A DAY (Patient taking differently: Take 50 mg by mouth 2 (two) times a day. ) 180 tablet 1  . montelukast (SINGULAIR) 10 MG tablet TAKE 1 TABLET AT BEDTIME (Patient taking differently: Take 10 mg by mouth at bedtime. ) 90 tablet 4  . nitroGLYCERIN (NITROSTAT) 0.4 MG SL tablet Place 0.4 mg under the tongue every 5 (five) minutes as needed for chest pain.     . Oxycodone HCl 10 MG TABS Take 1 tablet (10 mg total) by mouth every 6 (six) hours as needed for moderate pain or severe pain. 20 tablet 0  . polyethylene glycol (MIRALAX / GLYCOLAX) 17 g packet Take 17 g by mouth daily. 14 each 0  . potassium chloride SA (K-DUR,KLOR-CON) 20 MEQ tablet Take 4 tablets (80 meq) by mouth twice daily, take an extra 1 tablet (20 meq) on the days you take metolazone (Patient taking differently: Take 80 mEq by mouth 2 (two) times daily. Take 4 tablets (80 meq) by mouth twice daily at breakfast and at lunch, take an extra 1 tablet (20 meq) on the days you take metolazone put tablet in applesauce to aid swallowing) 732 tablet 3  . predniSONE (DELTASONE) 10 MG tablet Take 1 tablet (10 mg total) by mouth daily with breakfast. 90 tablet 1   No current facility-administered medications for this visit.         Physical Exam Ht 5\' 6"  (1.676 m)   Wt 182 lb (82.6 kg)   BMI 29.38 kg/m  Gen:  WD/WN, NAD Skin: incision demonstrates some necrosis of the skin edges medially.  I have removed approximately two thirds of the staples.  The area over his patella appears to be desiccated skin.  I will begin treating this with  Silvadene.     Assessment/Plan: 1. Diabetic infection of left foot ([place]) This is now resulted in the need for below-knee amputation.  2. Hx of BKA, left ([place]) He is status post below-knee amputation.  He will require removal of the remaining staples in approximately 1 week.  I begun dressing the patellar area and the medial incision with Silvadene.  He will continue a light gauze dressing followed by an Ace wrap.  Follow-up in the office in approximately 7 to 10 days.      Hortencia Pilar 10/23/2018, 12:27 PM   This note was created with Dragon medical transcription system.  Any errors from dictation are unintentional.

## 2018-10-25 DIAGNOSIS — Z89512 Acquired absence of left leg below knee: Secondary | ICD-10-CM | POA: Insufficient documentation

## 2018-10-30 ENCOUNTER — Telehealth: Payer: Self-pay | Admitting: Internal Medicine

## 2018-10-30 NOTE — Telephone Encounter (Signed)
Patient called today and stated that he is in the rehab center right now. He is suppose to be released on Thursday. Patient is requested a call back to see what you suggest on coming in to see you. He stated his arms are really red and raw now. Patient stated that when the nurse went to fix his bandage she pulled the skin off and this is why is raw and sore/    Please advise  Phone- [phone]

## 2018-10-30 NOTE — Telephone Encounter (Signed)
I need to see him here for a hospital follow up.  Is he being discharged with home health?

## 2018-10-31 ENCOUNTER — Other Ambulatory Visit: Payer: Self-pay

## 2018-10-31 ENCOUNTER — Ambulatory Visit (INDEPENDENT_AMBULATORY_CARE_PROVIDER_SITE_OTHER): Payer: Medicare Other | Admitting: Vascular Surgery

## 2018-10-31 ENCOUNTER — Encounter (INDEPENDENT_AMBULATORY_CARE_PROVIDER_SITE_OTHER): Payer: Self-pay | Admitting: Vascular Surgery

## 2018-10-31 VITALS — BP 101/62 | HR 89 | Resp 16

## 2018-10-31 DIAGNOSIS — E1121 Type 2 diabetes mellitus with diabetic nephropathy: Secondary | ICD-10-CM

## 2018-10-31 DIAGNOSIS — I10 Essential (primary) hypertension: Secondary | ICD-10-CM

## 2018-10-31 DIAGNOSIS — Z89512 Acquired absence of left leg below knee: Secondary | ICD-10-CM

## 2018-10-31 DIAGNOSIS — Z794 Long term (current) use of insulin: Secondary | ICD-10-CM

## 2018-10-31 DIAGNOSIS — Z79899 Other long term (current) drug therapy: Secondary | ICD-10-CM

## 2018-10-31 NOTE — Assessment & Plan Note (Signed)
blood glucose control important in reducing the progression of atherosclerotic disease. Also, involved in wound healing. On appropriate medications.  

## 2018-10-31 NOTE — Assessment & Plan Note (Signed)
blood pressure control important in reducing the progression of atherosclerotic disease. On appropriate oral medications.  

## 2018-10-31 NOTE — Assessment & Plan Note (Signed)
The wound is doing okay.  Most of the staples were removed.  We will check him back in about 2 weeks to see how the area of opening on the lateral aspect is doing as well as the patellar necrosis that will need to be monitored.  Continue to work on straightening his knee and avoiding pressure.

## 2018-10-31 NOTE — Progress Notes (Signed)
Patient ID: [name]., male   DOB: 08-10-1931, 85 y.o.   MRN: [id number]  Chief Complaint  Patient presents with  . Follow-up    HPI Stone Spirito. is a 85 y.o. male.  Patient returns in follow-up of his left below-knee amputation.  This was done almost a month ago.  He has some mild patella necrosis which is reasonably stable.  The lateral aspect of his wound is slightly open, but there is no evidence of erythema, drainage, or infection.  He is doing a pretty good job of straightening his leg but is not quite straight.  No new problems since his last visit.  I removed all but a few of his staples near the area of mild opening to try to keep the skin intact.  Some Steri-Strips were placed.   Past Medical History:  Diagnosis Date  . Arthritis   . CAD    a. MI 01/29/1996 tx'd w/ TPA @ [place]; b. Myoview 06/2005: EF 50%, scar @ apex, mild peri-infarct ischemia  . Cancer ([place])    skin  . Chronic atrial fibrillation    a. since 2006; b. on warfarin  . Chronic diastolic CHF (congestive heart failure) ([place])    a. echo 04/2006: EF lower limits of nl, mod LVH, mild aortic root dilatation, & mild MR, biatrial enlargement; b. echo 04/2013: EF 60%, mod dilated LA, mild MR & TR, mod pulm HTN w/ RV systolic pressure 53, c. echo 04/21/14: EF 55-60%, unable to exclude WMA, severely dilated LA 6.6 cm, nl RVSP, mildly dilated aortic root  . COPD (chronic obstructive pulmonary disease) (HCC)    oxygen prn at home  . CVA 2706,2376   x2  . DM   . Falls   . GERD (gastroesophageal reflux disease)   . History of kidney stones   . HYPERLIPIDEMIA   . HYPERTENSION   . Kidney stone    a. s/p left ureteral stenting 04/24/14  . Left arm weakness    limited movement. S/P fall injury  . Neuropathy of both feet   . Poor balance   . Wears dentures    full upper and lower    Past Surgical History:  Procedure Laterality Date  . AMPUTATION Left 10/06/2018   Procedure: AMPUTATION BELOW KNEE;   Surgeon: Algernon Huxley, MD;  Location: [facility];  Service: General;  Laterality: Left;  . AMPUTATION TOE Left 06/13/2018   Procedure: 1st Ray Resection Left;  Surgeon: Sharlotte Alamo, DPM;  Location: [facility];  Service: Podiatry;  Laterality: Left;  . BLADDER SURGERY     stent placement   . CARDIAC CATHETERIZATION  1997   [facility]  . CAROTID STENT INSERTION  1997  . CATARACT EXTRACTION W/PHACO Left 10/11/2017   Procedure: CATARACT EXTRACTION PHACO AND INTRAOCULAR LENS PLACEMENT ([place]) COMPLICATED LEFT DIABETIC;  Surgeon: Leandrew Koyanagi, MD;  Location: [facility];  Service: Ophthalmology;  Laterality: Left;  MALYUGIN Diabetic - insulin  . CATARACT EXTRACTION W/PHACO Right 11/30/2017   Procedure: CATARACT EXTRACTION PHACO AND INTRAOCULAR LENS PLACEMENT ([place]) COMPLICATED  RIGHT DIABETIC;  Surgeon: Leandrew Koyanagi, MD;  Location: [facility];  Service: Ophthalmology;  Laterality: Right;  Diabetic - insulin  . CIRCUMCISION  2016  . CORONARY ANGIOPLASTY  1997   s/p stent placement x 2   . CYSTOSCOPY W/ URETERAL STENT REMOVAL Left 10/09/2014   Procedure: CYSTOSCOPY WITH STENT REMOVAL;  Surgeon: Hollice Espy, MD;  Location: [facility];  Service: Urology;  Laterality: Left;  . CYSTOSCOPY WITH STENT PLACEMENT Left 10/09/2014   Procedure: CYSTOSCOPY WITH STENT PLACEMENT;  Surgeon: Hollice Espy, MD;  Location: [facility];  Service: Urology;  Laterality: Left;  . IRRIGATION AND DEBRIDEMENT FOOT Left 09/15/2018   Procedure: IRRIGATION AND DEBRIDEMENT FOOT DELAY CLOSURE;  Surgeon: Samara Deist, DPM;  Location: [facility];  Service: Podiatry;  Laterality: Left;  . KIDNEY SURGERY  05/2013   s/p stent placement   . LOWER EXTREMITY ANGIOGRAPHY Left 06/12/2018   Procedure: Lower Extremity Angiography;  Surgeon: Algernon Huxley, MD;  Location: [facility];  Service: Cardiovascular;  Laterality: Left;  . LOWER EXTREMITY ANGIOGRAPHY Left 09/13/2018   Procedure: Lower Extremity Angiography;  Surgeon:  Algernon Huxley, MD;  Location: [facility];  Service: Cardiovascular;  Laterality: Left;  . stents ureters Bilateral   . TONSILLECTOMY AND ADENOIDECTOMY  1959  . URETEROSCOPY WITH HOLMIUM LASER LITHOTRIPSY Left 10/09/2014   Procedure: URETEROSCOPY WITH HOLMIUM LASER LITHOTRIPSY;  Surgeon: Hollice Espy, MD;  Location: [facility];  Service: Urology;  Laterality: Left;      Allergies  Allergen Reactions  . Contrast Media [Iodinated Diagnostic Agents] Shortness Of Breath  . Morphine And Related Other (See Comments)    Hallucinations   . Niacin And Related Dermatitis  . Other     Other reaction(s): SHORTNESS OF BREATH  . Amlodipine Rash    Current Outpatient Medications  Medication Sig Dispense Refill  . acetaminophen (TYLENOL) 650 MG CR tablet Take 650 mg by mouth every 8 (eight) hours as needed for pain.     Marland Kitchen albuterol (PROAIR HFA) 108 (90 Base) MCG/ACT inhaler USE 1 TO 2 INHALATIONS EVERY 4 HOURS AS NEEDED (Patient taking differently: Inhale 1-2 puffs into the lungs every 4 (four) hours as needed for wheezing or shortness of breath. ) 25.5 g 3  . ALPRAZolam (XANAX) 0.25 MG tablet Take 1 tablet (0.25 mg total) by mouth at bedtime as needed for anxiety. 5 tablet 0  . Amino Acids-Protein Hydrolys (FEEDING SUPPLEMENT, PRO-STAT SUGAR FREE 64,) LIQD Take 30 mLs by mouth 3 (three) times daily with meals.    Marland Kitchen atorvastatin (LIPITOR) 40 MG tablet TAKE 1 TABLET DAILY 90 tablet 0  . citalopram (CELEXA) 10 MG tablet TAKE 1 TABLET DAILY (Patient taking differently: Take 10 mg by mouth daily. ) 90 tablet 0  . COLCRYS 0.6 MG tablet TAKE 1 TABLET TWICE A DAY AS NEEDED (ACUTE GOUT FLARE) USE AS DIRECTED (Patient taking differently: Take 0.6 mg by mouth 2 (two) times daily as needed (acute gout flare). ) 90 tablet 7  . COMBIGAN 0.2-0.5 % ophthalmic solution Place 1 drop into both eyes 2 (two) times a day.     . diclofenac sodium (VOLTAREN) 1 % GEL Apply 4 g topically 4 (four) times daily. 5 Tube 5   . docusate sodium (COLACE) 100 MG capsule Take 1 capsule (100 mg total) by mouth 2 (two) times daily. 10 capsule 0  . dorzolamide (TRUSOPT) 2 % ophthalmic solution Place 1 drop into both eyes 3 (three) times daily.     Marland Kitchen ELIQUIS 5 MG TABS tablet TAKE 1 TABLET TWICE A DAY (Patient taking differently: Take 5 mg by mouth 2 (two) times daily. ) 180 tablet 1  . ezetimibe (ZETIA) 10 MG tablet TAKE 1 TABLET DAILY 90 tablet 0  . finasteride (PROSCAR) 5 MG tablet TAKE 1 TABLET DAILY (Patient taking differently: Take 5 mg by mouth daily. ) 90 tablet 4  .  gabapentin (NEURONTIN) 100 MG capsule TAKE 1 CAPSULE THREE TIMES A DAY (Patient taking differently: Take 100 mg by mouth 3 (three) times daily. ) 270 capsule 1  . glipiZIDE (GLUCOTROL) 5 MG tablet Take 5 mg by mouth 2 (two) times daily before a meal.    . insulin lispro (HUMALOG) 100 UNIT/ML injection Inject 0.04 mLs (4 Units total) into the skin 3 (three) times daily with meals. (Patient taking differently: Inject 10-18 Units into the skin 3 (three) times daily with meals. 18 am, 20 lunch, 16 supper) 10 mL 11  . insulin NPH Human (NOVOLIN N) 100 UNIT/ML injection Inject 18 Units into the skin 2 (two) times a day.    . Insulin Pen Needle (BD PEN NEEDLE NANO U/F) 32G X 4 MM MISC USE THREE TIMES A DAY FOR INSULIN ADMINISTRATION 270 each 4  . Insulin Syringe-Needle U-100 30G X 1/2" 1 ML MISC 1 each by Does not apply route 3 (three) times daily. 200 each 5  . latanoprost (XALATAN) 0.005 % ophthalmic solution Place 1 drop into both eyes at bedtime.     Marland Kitchen levothyroxine (SYNTHROID) 50 MCG tablet TAKE 1 TABLET DAILY BEFORE BREAKFAST (Patient taking differently: Take 50 mcg by mouth daily before breakfast. ) 90 tablet 0  . magnesium citrate SOLN Take 296 mLs (1 Bottle total) by mouth daily as needed for severe constipation. 195 mL 0  . metolazone (ZAROXOLYN) 5 MG tablet Take 1 tablet (5 mg total) by mouth daily as needed (swelling). 90 tablet 3  . metoprolol succinate  (TOPROL-XL) 50 MG 24 hr tablet TAKE 1 TABLET TWICE A DAY (Patient taking differently: Take 50 mg by mouth 2 (two) times a day. ) 180 tablet 1  . montelukast (SINGULAIR) 10 MG tablet TAKE 1 TABLET AT BEDTIME (Patient taking differently: Take 10 mg by mouth at bedtime. ) 90 tablet 4  . Multiple Vitamin (MULTIVITAMIN) capsule Take 1 capsule by mouth daily.    . nitroGLYCERIN (NITROSTAT) 0.4 MG SL tablet Place 0.4 mg under the tongue every 5 (five) minutes as needed for chest pain.     . Oxycodone HCl 10 MG TABS Take 1 tablet (10 mg total) by mouth every 6 (six) hours as needed for moderate pain or severe pain. 20 tablet 0  . polyethylene glycol (MIRALAX / GLYCOLAX) 17 g packet Take 17 g by mouth daily. 14 each 0  . potassium chloride SA (K-DUR,KLOR-CON) 20 MEQ tablet Take 4 tablets (80 meq) by mouth twice daily, take an extra 1 tablet (20 meq) on the days you take metolazone (Patient taking differently: Take 80 mEq by mouth 2 (two) times daily. Take 4 tablets (80 meq) by mouth twice daily at breakfast and at lunch, take an extra 1 tablet (20 meq) on the days you take metolazone put tablet in applesauce to aid swallowing) 732 tablet 3  . predniSONE (DELTASONE) 10 MG tablet Take 1 tablet (10 mg total) by mouth daily with breakfast. 90 tablet 1  . silver sulfADIAZINE (SILVADENE) 1 % cream Apply 1 application topically daily.    . SYMBICORT 160-4.5 MCG/ACT inhaler USE 2 INHALATIONS TWICE A DAY (Patient taking differently: Inhale 1 puff into the lungs 2 (two) times daily. ) 30.6 g 3  . tamsulosin (FLOMAX) 0.4 MG CAPS capsule TAKE 1 CAPSULE DAILY (Patient taking differently: Take 0.4 mg by mouth daily. ) 90 capsule 0  . torsemide (DEMADEX) 20 MG tablet Take 40 mg by mouth 2 (two) times daily.    Marland Kitchen  traZODone (DESYREL) 50 MG tablet Take 2 tablets (100 mg total) by mouth at bedtime. 180 tablet 0  . umeclidinium bromide (INCRUSE ELLIPTA) 62.5 MCG/INH AEPB Inhale 1 puff into the lungs daily.    . vitamin C (ASCORBIC  ACID) 250 MG tablet Take 250 mg by mouth 2 (two) times daily.    . insulin detemir (LEVEMIR) 100 UNIT/ML injection Inject 0.28 mLs (28 Units total) into the skin daily at 10 pm. (Patient not taking: Reported on 10/31/2018) 10 mL 11  . levofloxacin (LEVAQUIN) 250 MG tablet Take 1 tablet (250 mg total) by mouth daily. (Patient not taking: Reported on 10/31/2018) 4 tablet 0   No current facility-administered medications for this visit.         Physical Exam BP 101/62 (BP Location: Right Arm)   Pulse 89   Resp 16  Gen:  WD/WN, NAD Skin: Wound as described above     Assessment/Plan:  Essential hypertension blood pressure control important in reducing the progression of atherosclerotic disease. On appropriate oral medications.   Diabetes type 2, controlled ([place]) blood glucose control important in reducing the progression of atherosclerotic disease. Also, involved in wound healing. On appropriate medications.   Hx of BKA, left ([place]) The wound is doing okay.  Most of the staples were removed.  We will check him back in about 2 weeks to see how the area of opening on the lateral aspect is doing as well as the patellar necrosis that will need to be monitored.  Continue to work on straightening his knee and avoiding pressure.      Leotis Pain 10/31/2018, 4:28 PM   This note was created with Dragon medical transcription system.  Any errors from dictation are unintentional.

## 2018-11-01 ENCOUNTER — Other Ambulatory Visit: Payer: Self-pay | Admitting: Internal Medicine

## 2018-11-01 NOTE — Telephone Encounter (Signed)
Last filled 05/05/2018 #90 with 1 refill, is this appropriate? Please advise

## 2018-11-01 NOTE — Telephone Encounter (Signed)
noted 

## 2018-11-01 NOTE — Telephone Encounter (Signed)
Patient's Daughter called to schedule hospital follow up. She stated patient would be getting discharged with home health but they were not sure who would be doing this.   Patient has been schedule next Tuesday 7/28

## 2018-11-04 ENCOUNTER — Encounter (INDEPENDENT_AMBULATORY_CARE_PROVIDER_SITE_OTHER): Payer: Self-pay

## 2018-11-07 ENCOUNTER — Encounter: Payer: Self-pay | Admitting: Internal Medicine

## 2018-11-07 ENCOUNTER — Other Ambulatory Visit: Payer: Self-pay

## 2018-11-07 ENCOUNTER — Ambulatory Visit (INDEPENDENT_AMBULATORY_CARE_PROVIDER_SITE_OTHER): Payer: Medicare Other | Admitting: Internal Medicine

## 2018-11-07 VITALS — BP 106/68 | HR 77 | Temp 97.6°F

## 2018-11-07 DIAGNOSIS — J189 Pneumonia, unspecified organism: Secondary | ICD-10-CM | POA: Diagnosis not present

## 2018-11-07 DIAGNOSIS — Z89512 Acquired absence of left leg below knee: Secondary | ICD-10-CM | POA: Diagnosis not present

## 2018-11-07 DIAGNOSIS — M86472 Chronic osteomyelitis with draining sinus, left ankle and foot: Secondary | ICD-10-CM | POA: Diagnosis not present

## 2018-11-07 NOTE — Progress Notes (Addendum)
Subjective:    Patient ID: [name]., male    DOB: 05/29/1931, 85 y.o.   MRN: [id number]  HPI  Pt presents to the clinic today for hospital followup. He went to the ER 6/23 with c/o foul odor and drainage from his recent toe amputation. He was sent by Dr. Cleda Mccreedy who had done an amputation earlier in the year, with recent debridement and angioplasty with stent placement by Dr. Lucky Cowboy. He was diagnosed with osteomyelitis of the left foot, taken to the OR for a BKA. His post op was complicated by pneumonia for which he was treated with Levaquin. Upon discharge on 7/3, he was sent to a SNF for rehab. He discharged home from rehab after 3 weeks. He comes in today, requesting a power wheelchair for better mobility. He will be having in home PT/OT. He does reports some phantom pain or the LLE.   The patient is s/p left BKA which requires positioning of the body in ways not feasible with an ordinary bed and  The patient requires the head of the bed to be elevated more than 30 degrees most of  the time due to congestive heart failure.   A gel overlay is required due to limited mobility secondary to left BKA. He also has constant pressure on the sacrum, impaired mobility, decreased sensation and vascular compromise.   Trapeze: Patient is unable to ndependently reposition while in bed due to lack of upper extremity strength secondary to multiple chronic health conditions including generalized weakness, frequent falls, chronic shortness of breath, s/p BKA.   Review of Systems      Past Medical History:  Diagnosis Date  . Arthritis   . CAD    a. MI 01/29/1996 tx'd w/ TPA @ [facility]; b. Myoview 06/2005: EF 50%, scar @ apex, mild peri-infarct ischemia  . Cancer ([place])    skin  . Chronic atrial fibrillation    a. since 2006; b. on warfarin  . Chronic diastolic CHF (congestive heart failure) ([place])    a. echo 04/2006: EF lower limits of nl, mod LVH, mild aortic root dilatation, & mild MR, biatrial  enlargement; b. echo 04/2013: EF 60%, mod dilated LA, mild MR & TR, mod pulm HTN w/ RV systolic pressure 53, c. echo 04/21/14: EF 55-60%, unable to exclude WMA, severely dilated LA 6.6 cm, nl RVSP, mildly dilated aortic root  . COPD (chronic obstructive pulmonary disease) ([facility])    oxygen prn at home  . CVA [id number]   x2  . DM   . Falls   . GERD (gastroesophageal reflux disease)   . History of kidney stones   . HYPERLIPIDEMIA   . HYPERTENSION   . Kidney stone    a. s/p left ureteral stenting 04/24/14  . Left arm weakness    limited movement. S/P fall injury  . Neuropathy of both feet   . Poor balance   . Wears dentures    full upper and lower    Current Outpatient Medications  Medication Sig Dispense Refill  . acetaminophen (TYLENOL) 650 MG CR tablet Take 650 mg by mouth every 8 (eight) hours as needed for pain.     Marland Kitchen albuterol (PROAIR HFA) 108 (90 Base) MCG/ACT inhaler USE 1 TO 2 INHALATIONS EVERY 4 HOURS AS NEEDED (Patient taking differently: Inhale 1-2 puffs into the lungs every 4 (four) hours as needed for wheezing or shortness of breath. ) 25.5 g 3  . ALPRAZolam (XANAX) 0.25 MG tablet Take 1 tablet (  0.25 mg total) by mouth at bedtime as needed for anxiety. 5 tablet 0  . Amino Acids-Protein Hydrolys (FEEDING SUPPLEMENT, PRO-STAT SUGAR FREE 64,) LIQD Take 30 mLs by mouth 3 (three) times daily with meals.    Marland Kitchen atorvastatin (LIPITOR) 40 MG tablet TAKE 1 TABLET DAILY 90 tablet 0  . citalopram (CELEXA) 10 MG tablet TAKE 1 TABLET DAILY (Patient taking differently: Take 10 mg by mouth daily. ) 90 tablet 0  . COLCRYS 0.6 MG tablet TAKE 1 TABLET TWICE A DAY AS NEEDED (ACUTE GOUT FLARE) USE AS DIRECTED (Patient taking differently: Take 0.6 mg by mouth 2 (two) times daily as needed (acute gout flare). ) 90 tablet 7  . COMBIGAN 0.2-0.5 % ophthalmic solution Place 1 drop into both eyes 2 (two) times a day.     . diclofenac sodium (VOLTAREN) 1 % GEL Apply 4 g topically 4 (four) times daily. 5 Tube  5  . docusate sodium (COLACE) 100 MG capsule Take 1 capsule (100 mg total) by mouth 2 (two) times daily. 10 capsule 0  . dorzolamide (TRUSOPT) 2 % ophthalmic solution Place 1 drop into both eyes 3 (three) times daily.     Marland Kitchen ELIQUIS 5 MG TABS tablet TAKE 1 TABLET TWICE A DAY (Patient taking differently: Take 5 mg by mouth 2 (two) times daily. ) 180 tablet 1  . ezetimibe (ZETIA) 10 MG tablet TAKE 1 TABLET DAILY 90 tablet 0  . finasteride (PROSCAR) 5 MG tablet TAKE 1 TABLET DAILY (Patient taking differently: Take 5 mg by mouth daily. ) 90 tablet 4  . gabapentin (NEURONTIN) 100 MG capsule TAKE 1 CAPSULE THREE TIMES A DAY (Patient taking differently: Take 100 mg by mouth 3 (three) times daily. ) 270 capsule 1  . glipiZIDE (GLUCOTROL) 5 MG tablet Take 5 mg by mouth 2 (two) times daily before a meal.    . insulin detemir (LEVEMIR) 100 UNIT/ML injection Inject 0.28 mLs (28 Units total) into the skin daily at 10 pm. (Patient taking differently: Inject 42 Units into the skin daily at 10 pm. ) 10 mL 11  . insulin lispro (HUMALOG) 100 UNIT/ML injection Inject 0.04 mLs (4 Units total) into the skin 3 (three) times daily with meals. (Patient taking differently: Inject 10-18 Units into the skin 3 (three) times daily with meals. 18 am, 20 lunch, 16 supper) 10 mL 11  . Insulin Pen Needle (BD PEN NEEDLE NANO U/F) 32G X 4 MM MISC USE THREE TIMES A DAY FOR INSULIN ADMINISTRATION 270 each 4  . Insulin Syringe-Needle U-100 30G X 1/2" 1 ML MISC 1 each by Does not apply route 3 (three) times daily. 200 each 5  . latanoprost (XALATAN) 0.005 % ophthalmic solution Place 1 drop into both eyes at bedtime.     Marland Kitchen levothyroxine (SYNTHROID) 50 MCG tablet TAKE 1 TABLET DAILY BEFORE BREAKFAST (Patient taking differently: Take 50 mcg by mouth daily before breakfast. ) 90 tablet 0  . magnesium citrate SOLN Take 296 mLs (1 Bottle total) by mouth daily as needed for severe constipation. 195 mL 0  . metolazone (ZAROXOLYN) 5 MG tablet Take 1  tablet (5 mg total) by mouth daily as needed (swelling). 90 tablet 3  . metoprolol succinate (TOPROL-XL) 50 MG 24 hr tablet TAKE 1 TABLET TWICE A DAY (Patient taking differently: Take 50 mg by mouth 2 (two) times a day. ) 180 tablet 1  . montelukast (SINGULAIR) 10 MG tablet TAKE 1 TABLET AT BEDTIME (Patient taking differently: Take 10 mg  by mouth at bedtime. ) 90 tablet 4  . Multiple Vitamin (MULTIVITAMIN) capsule Take 1 capsule by mouth daily.    . nitroGLYCERIN (NITROSTAT) 0.4 MG SL tablet Place 0.4 mg under the tongue every 5 (five) minutes as needed for chest pain.     . Oxycodone HCl 10 MG TABS Take 1 tablet (10 mg total) by mouth every 6 (six) hours as needed for moderate pain or severe pain. 20 tablet 0  . polyethylene glycol (MIRALAX / GLYCOLAX) 17 g packet Take 17 g by mouth daily. 14 each 0  . potassium chloride SA (K-DUR,KLOR-CON) 20 MEQ tablet Take 4 tablets (80 meq) by mouth twice daily, take an extra 1 tablet (20 meq) on the days you take metolazone (Patient taking differently: Take 80 mEq by mouth 2 (two) times daily. Take 4 tablets (80 meq) by mouth twice daily at breakfast and at lunch, take an extra 1 tablet (20 meq) on the days you take metolazone put tablet in applesauce to aid swallowing) 732 tablet 3  . predniSONE (DELTASONE) 10 MG tablet TAKE 1 TABLET DAILY WITH BREAKFAST 90 tablet 3  . silver sulfADIAZINE (SILVADENE) 1 % cream Apply 1 application topically daily.    . SYMBICORT 160-4.5 MCG/ACT inhaler USE 2 INHALATIONS TWICE A DAY (Patient taking differently: Inhale 1 puff into the lungs 2 (two) times daily. ) 30.6 g 3  . tamsulosin (FLOMAX) 0.4 MG CAPS capsule TAKE 1 CAPSULE DAILY (Patient taking differently: Take 0.4 mg by mouth daily. ) 90 capsule 0  . torsemide (DEMADEX) 20 MG tablet Take 40 mg by mouth 2 (two) times daily.    . traZODone (DESYREL) 50 MG tablet Take 2 tablets (100 mg total) by mouth at bedtime. 180 tablet 0  . umeclidinium bromide (INCRUSE ELLIPTA) 62.5  MCG/INH AEPB Inhale 1 puff into the lungs daily.    . vitamin C (ASCORBIC ACID) 250 MG tablet Take 250 mg by mouth 2 (two) times daily.     No current facility-administered medications for this visit.     Allergies  Allergen Reactions  . Contrast Media [Iodinated Diagnostic Agents] Shortness Of Breath  . Morphine And Related Other (See Comments)    Hallucinations   . Niacin And Related Dermatitis  . Other     Other reaction(s): SHORTNESS OF BREATH  . Amlodipine Rash    Family History  Problem Relation Age of Onset  . Heart disease Mother   . Diabetes Mother   . Heart disease Maternal Grandmother   . Diabetes Maternal Grandmother   . Cancer Neg Hx   . Stroke Neg Hx     Social History   Socioeconomic History  . Marital status: Married    Spouse name: Not on file  . Number of children: Not on file  . Years of education: Not on file  . Highest education level: Not on file  Occupational History  . Not on file  Social Needs  . Financial resource strain: Not very hard  . Food insecurity    Worry: Never true    Inability: Never true  . Transportation needs    Medical: No    Non-medical: No  Tobacco Use  . Smoking status: Former Smoker    Packs/day: 2.00    Years: 40.00    Pack years: 80.00    Types: Cigarettes    Quit date: 05/24/1990    Years since quitting: 28.4  . Smokeless tobacco: Former Systems developer    Quit date: 05/24/1990  Substance  and Sexual Activity  . Alcohol use: No  . Drug use: No  . Sexual activity: Not Currently  Lifestyle  . Physical activity    Days per week: Patient refused    Minutes per session: Patient refused  . Stress: Not at all  Relationships  . Social Herbalist on phone: Patient refused    Gets together: Patient refused    Attends religious service: Patient refused    Active member of club or organization: Patient refused    Attends meetings of clubs or organizations: Patient refused    Relationship status: Patient refused   . Intimate partner violence    Fear of current or ex partner: Patient refused    Emotionally abused: Patient refused    Physically abused: Patient refused    Forced sexual activity: Patient refused  Other Topics Concern  . Not on file  Social History Narrative  . Not on file     Constitutional: Pt reports fatigue. Denies fever, malaise, fatigue, headache.  Respiratory: Pt reports intermittent SOB. Denies difficulty breathing, cough or sputum production.   Cardiovascular: Pt reports swelling in right leg. Denies chest pain, chest tightness, palpitations or swelling in the hands Gastrointestinal: Pt reports constipation. Denies abdominal pain, bloating, constipation, diarrhea or blood in the stool.  Musculoskeletal: Pt reports he is unable to ambulate due to BKA, too early for prosthesis.  Denies decrease in range of motion, muscle pain or joint pain and swelling.  Neurological: Pt reports phantom pain of LLE. Denies dizziness, difficulty with memory, difficulty with speech.    No other specific complaints in a complete review of systems (except as listed in HPI above).  Objective:   Physical Exam   BP 106/68   Pulse 77   Temp 97.6 F (36.4 C) (Temporal)   SpO2 98%  Wt Readings from Last 3 Encounters:  10/23/18 182 lb (82.6 kg)  10/03/18 186 lb 15.2 oz (84.8 kg)  09/13/18 187 lb (84.8 kg)    General: Appears his stated age, chronically ill appearing in NAD. Skin: Stump covered in dressing wrapped with ACE bandage. Cardiovascular: Normal rate with irregular rhythm. 1+ pitting RLE edema noted. Pulmonary/Chest: Normal effort and positive vesicular breath sounds. No respiratory distress. No wheezes, rales or ronchi noted.  Abdomen: Soft and nontender. Normal bowel sounds.  Musculoskeletal: Able to flex and extend left stump. In manual wheelchair, unable to ambulate.  Neurological: Alert and oriented.    BMET    Component Value Date/Time   NA 140 10/10/2018 1826   NA 143  02/21/2015 1108   NA 137 08/05/2014 1839   K 4.3 10/10/2018 1826   K 3.6 08/05/2014 1839   CL 101 10/10/2018 1826   CL 106 08/05/2014 1839   CO2 28 10/10/2018 1826   CO2 23 08/05/2014 1839   GLUCOSE 226 (H) 10/10/2018 1826   GLUCOSE 235 (H) 08/05/2014 1839   BUN 36 (H) 10/10/2018 1826   BUN 26 02/21/2015 1108   BUN 19 08/05/2014 1839   CREATININE 1.27 (H) 10/13/2018 0423   CREATININE 1.20 08/05/2014 1839   CALCIUM 9.6 10/10/2018 1826   CALCIUM 8.6 (L) 08/05/2014 1839   GFRNONAA 52 (L) 10/13/2018 0423   GFRNONAA 57 (L) 08/05/2014 1839   GFRAA 60 (L) 10/13/2018 0423   GFRAA >60 08/05/2014 1839    Lipid Panel     Component Value Date/Time   CHOL 122 02/16/2018 1126   TRIG 189.0 (H) 02/16/2018 1126   HDL 47.50 02/16/2018  1126   CHOLHDL 3 02/16/2018 1126   VLDL 37.8 02/16/2018 1126   LDLCALC 37 02/16/2018 1126    CBC    Component Value Date/Time   WBC 17.1 (H) 10/12/2018 0333   RBC 3.97 (L) 10/12/2018 0333   HGB 11.4 (L) 10/12/2018 0333   HGB 13.3 08/05/2014 1839   HCT 37.5 (L) 10/12/2018 0333   HCT 40.1 08/05/2014 1839   PLT 323 10/12/2018 0333   PLT 246 08/05/2014 1839   MCV 94.5 10/12/2018 0333   MCV 83 08/05/2014 1839   MCH 28.7 10/12/2018 0333   MCHC 30.4 10/12/2018 0333   RDW 17.2 (H) 10/12/2018 0333   RDW 16.3 (H) 08/05/2014 1839   LYMPHSABS 2.8 10/12/2018 0333   LYMPHSABS 2.3 08/05/2014 1839   MONOABS 2.1 (H) 10/12/2018 0333   MONOABS 2.0 (H) 08/05/2014 1839   EOSABS 0.0 10/12/2018 0333   EOSABS 0.1 08/05/2014 1839   BASOSABS 0.1 10/12/2018 0333   BASOSABS 0.1 08/05/2014 1839    Hgb A1C Lab Results  Component Value Date   HGBA1C 7.4 (H) 09/11/2018           Assessment & Plan:   Hospital Follow Up for Osteomyelitis of Left Foot, s/p BKA LLE, Pneumonia:  Hospital/Rehab notes, labs and imaging reviewed Continue current meds as prescribed DME order for power wheelchair, needed for increased mobility due to recent BKA, too early for  prosthesis A regular manual wheelchair will not suffice, as he does not have the upper body strength to push the wheels, and it's too large for him to get around in his home He will follow up with podiatry and vascular as scheduled   Return precautions discussed Webb Silversmith, NP

## 2018-11-08 ENCOUNTER — Other Ambulatory Visit: Payer: Self-pay | Admitting: Internal Medicine

## 2018-11-08 ENCOUNTER — Other Ambulatory Visit: Payer: Self-pay | Admitting: Pulmonary Disease

## 2018-11-08 NOTE — Telephone Encounter (Signed)
Refill for symbicort sent to pharmacy with no more refills due to pt needing f/u appt.

## 2018-11-14 ENCOUNTER — Other Ambulatory Visit: Payer: Self-pay

## 2018-11-14 ENCOUNTER — Encounter (INDEPENDENT_AMBULATORY_CARE_PROVIDER_SITE_OTHER): Payer: Self-pay | Admitting: Nurse Practitioner

## 2018-11-14 ENCOUNTER — Ambulatory Visit (INDEPENDENT_AMBULATORY_CARE_PROVIDER_SITE_OTHER): Payer: Medicare Other | Admitting: Nurse Practitioner

## 2018-11-14 VITALS — BP 107/67 | HR 70 | Resp 17 | Ht 66.0 in | Wt 172.0 lb

## 2018-11-14 DIAGNOSIS — M170 Bilateral primary osteoarthritis of knee: Secondary | ICD-10-CM

## 2018-11-14 DIAGNOSIS — T879 Unspecified complications of amputation stump: Secondary | ICD-10-CM | POA: Insufficient documentation

## 2018-11-14 DIAGNOSIS — E1121 Type 2 diabetes mellitus with diabetic nephropathy: Secondary | ICD-10-CM

## 2018-11-14 DIAGNOSIS — Z89512 Acquired absence of left leg below knee: Secondary | ICD-10-CM

## 2018-11-14 DIAGNOSIS — J449 Chronic obstructive pulmonary disease, unspecified: Secondary | ICD-10-CM

## 2018-11-14 MED ORDER — COLLAGENASE 250 UNIT/GM EX OINT: 1.0000 "application " | TOPICAL_OINTMENT | Freq: Every day | CUTANEOUS | 1 refills | Status: DC

## 2018-11-14 NOTE — Progress Notes (Signed)
SUBJECTIVE:  Patient ID: Shawn Grace., male    DOB: 26-Jan-1932, 85 y.o.   MRN: [id number] Chief Complaint  Patient presents with  . Follow-up    staple removal    HPI  Shawn Pember. is a 85 y.o. male that returns today to follow-up for his left below-knee amputation.  Amputation was done on 10/06/2018.  There is some small area of patellar necrosis which is dried and scabbed over.  The stump has approximately 4 staples left.  Overall it is well approximated except for the lateral edge of the wound which is completely dehisced at this point.  The area is covered in fibrinous exudate with little drainage and no foul smelling odors.  The patient denies any pain.  The patient is set to have wound care come to his home sometime next week.  He denies any fever, chills, nausea, vomiting or diarrhea.  Denies any chest pain or shortness of breath.  Past Medical History:  Diagnosis Date  . Arthritis   . CAD    a. MI 01/29/1996 tx'd w/ TPA @ [facility]; b. Myoview 06/2005: EF 50%, scar @ apex, mild peri-infarct ischemia  . Cancer ([place])    skin  . Chronic atrial fibrillation    a. since 2006; b. on warfarin  . Chronic diastolic CHF (congestive heart failure) ([place])    a. echo 04/2006: EF lower limits of nl, mod LVH, mild aortic root dilatation, & mild MR, biatrial enlargement; b. echo 04/2013: EF 60%, mod dilated LA, mild MR & TR, mod pulm HTN w/ RV systolic pressure 53, c. echo 04/21/14: EF 55-60%, unable to exclude WMA, severely dilated LA 6.6 cm, nl RVSP, mildly dilated aortic root  . COPD (chronic obstructive pulmonary disease) (HCC)    oxygen prn at home  . CVA [id number]   x2  . DM   . Falls   . GERD (gastroesophageal reflux disease)   . History of kidney stones   . HYPERLIPIDEMIA   . HYPERTENSION   . Kidney stone    a. s/p left ureteral stenting 04/24/14  . Left arm weakness    limited movement. S/P fall injury  . Neuropathy of both feet   . Poor balance   . Wears dentures    full upper and lower    Past Surgical History:  Procedure Laterality Date  . AMPUTATION Left 10/06/2018   Procedure: AMPUTATION BELOW KNEE;  Surgeon: Algernon Huxley, MD;  Location: [facility];  Service: General;  Laterality: Left;  . AMPUTATION TOE Left 06/13/2018   Procedure: 1st Ray Resection Left;  Surgeon: Sharlotte Alamo, DPM;  Location: [facility];  Service: Podiatry;  Laterality: Left;  . BLADDER SURGERY     stent placement   . CARDIAC CATHETERIZATION  1997   [facility]  . CAROTID STENT INSERTION  1997  . CATARACT EXTRACTION W/PHACO Left 10/11/2017   Procedure: CATARACT EXTRACTION PHACO AND INTRAOCULAR LENS PLACEMENT ([place]) COMPLICATED LEFT DIABETIC;  Surgeon: Leandrew Koyanagi, MD;  Location: [place];  Service: Ophthalmology;  Laterality: Left;  MALYUGIN Diabetic - insulin  . CATARACT EXTRACTION W/PHACO Right 11/30/2017   Procedure: CATARACT EXTRACTION PHACO AND INTRAOCULAR LENS PLACEMENT ([place]) COMPLICATED  RIGHT DIABETIC;  Surgeon: Leandrew Koyanagi, MD;  Location: [place];  Service: Ophthalmology;  Laterality: Right;  Diabetic - insulin  . CIRCUMCISION  2016  . CORONARY ANGIOPLASTY  1997   s/p stent placement x 2   . CYSTOSCOPY W/ URETERAL STENT REMOVAL Left 10/09/2014  Procedure: CYSTOSCOPY WITH STENT REMOVAL;  Surgeon: Hollice Espy, MD;  Location: [facility];  Service: Urology;  Laterality: Left;  . CYSTOSCOPY WITH STENT PLACEMENT Left 10/09/2014   Procedure: CYSTOSCOPY WITH STENT PLACEMENT;  Surgeon: Hollice Espy, MD;  Location: [facility];  Service: Urology;  Laterality: Left;  . IRRIGATION AND DEBRIDEMENT FOOT Left 09/15/2018   Procedure: IRRIGATION AND DEBRIDEMENT FOOT DELAY CLOSURE;  Surgeon: Samara Deist, DPM;  Location: [facility];  Service: Podiatry;  Laterality: Left;  . KIDNEY SURGERY  05/2013   s/p stent placement   . LOWER EXTREMITY ANGIOGRAPHY Left 06/12/2018   Procedure: Lower Extremity Angiography;  Surgeon: Algernon Huxley, MD;  Location: [place] [facility];   Service: Cardiovascular;  Laterality: Left;  . LOWER EXTREMITY ANGIOGRAPHY Left 09/13/2018   Procedure: Lower Extremity Angiography;  Surgeon: Algernon Huxley, MD;  Location: [place] [facility];  Service: Cardiovascular;  Laterality: Left;  . stents ureters Bilateral   . TONSILLECTOMY AND ADENOIDECTOMY  1959  . URETEROSCOPY WITH HOLMIUM LASER LITHOTRIPSY Left 10/09/2014   Procedure: URETEROSCOPY WITH HOLMIUM LASER LITHOTRIPSY;  Surgeon: Hollice Espy, MD;  Location: [facility];  Service: Urology;  Laterality: Left;    Social History   Socioeconomic History  . Marital status: Married    Spouse name: Not on file  . Number of children: Not on file  . Years of education: Not on file  . Highest education level: Not on file  Occupational History  . Not on file  Social Needs  . Financial resource strain: Not very hard  . Food insecurity    Worry: Never true    Inability: Never true  . Transportation needs    Medical: No    Non-medical: No  Tobacco Use  . Smoking status: Former Smoker    Packs/day: 2.00    Years: 40.00    Pack years: 80.00    Types: Cigarettes    Quit date: 05/24/1990    Years since quitting: 28.4  . Smokeless tobacco: Former Systems developer    Quit date: 05/24/1990  Substance and Sexual Activity  . Alcohol use: No  . Drug use: No  . Sexual activity: Not Currently  Lifestyle  . Physical activity    Days per week: Patient refused    Minutes per session: Patient refused  . Stress: Not at all  Relationships  . Social Herbalist on phone: Patient refused    Gets together: Patient refused    Attends religious service: Patient refused    Active member of club or organization: Patient refused    Attends meetings of clubs or organizations: Patient refused    Relationship status: Patient refused  . Intimate partner violence    Fear of current or ex partner: Patient refused    Emotionally abused: Patient refused    Physically abused: Patient refused    Forced sexual  activity: Patient refused  Other Topics Concern  . Not on file  Social History Narrative  . Not on file    Family History  Problem Relation Age of Onset  . Heart disease Mother   . Diabetes Mother   . Heart disease Maternal Grandmother   . Diabetes Maternal Grandmother   . Cancer Neg Hx   . Stroke Neg Hx     Allergies  Allergen Reactions  . Contrast Media [Iodinated Diagnostic Agents] Shortness Of Breath  . Morphine And Related Other (See Comments)    Hallucinations   . Niacin And Related Dermatitis  .  Other     Other reaction(s): SHORTNESS OF BREATH  . Amlodipine Rash     Review of Systems   Review of Systems: Negative Unless Checked Constitutional: [] Weight loss  [] Fever  [] Chills Cardiac: [] Chest pain   [x]  Atrial Fibrillation  [] Palpitations   [] Shortness of breath when laying flat   [] Shortness of breath with exertion. [] Shortness of breath at rest Vascular:  [] Pain in legs with walking   [] Pain in legs with standing [] Pain in legs when laying flat   [] Claudication    [] Pain in feet when laying flat    [] History of DVT   [] Phlebitis   [] Swelling in legs   [] Varicose veins   [] Non-healing ulcers Pulmonary:   [] Uses home oxygen   [] Productive cough   [] Hemoptysis   [] Wheeze  [x] COPD   [] Asthma Neurologic:  [] Dizziness   [] Seizures  [] Blackouts [x] History of stroke   [] History of TIA  [] Aphasia   [] Temporary Blindness   [] Weakness or numbness in arm   [x] Weakness or numbness in leg Musculoskeletal:   [] Joint swelling   [] Joint pain   [] Low back pain  []  History of Knee Replacement [] Arthritis [] back Surgeries  []  Spinal Stenosis    Hematologic:  [] Easy bruising  [] Easy bleeding   [] Hypercoagulable state   [] Anemic Gastrointestinal:  [] Diarrhea   [] Vomiting  [x] Gastroesophageal reflux/heartburn   [] Difficulty swallowing. [] Abdominal pain Genitourinary:  [] Chronic kidney disease   [] Difficult urination  [] Anuric   [] Blood in urine [] Frequent urination  [] Burning with  urination   [] Hematuria Skin:  [] Rashes   [] Ulcers [x] Wounds Psychological:  [] History of anxiety   []  History of major depression  []  Memory Difficulties      OBJECTIVE:   Physical Exam  BP 107/67 (BP Location: Right Arm)   Pulse 70   Resp 17   Ht 5\' 6"  (1.676 m)   Wt 172 lb (78 kg)   BMI 27.76 kg/m   Gen: WD/WN, NAD Head: [place]/AT, No temporalis wasting.  Ear/Nose/Throat: Hearing grossly intact, nares w/o erythema or drainage Eyes: PER, EOMI, sclera nonicteric.  Neck: Supple, no masses.  No JVD.  Pulmonary:  Good air movement, no use of accessory muscles.  Cardiac: Irregular RR Vascular:  Left below-knee amputation has approximately 1.5 inch x 1 inch dehiscence at lateral portion. Vessel Right Left  Radial Palpable Palpable   Gastrointestinal: soft, non-distended. No guarding/no peritoneal signs.  Musculoskeletal: Left below-knee amputation.  No deformity or atrophy.  Neurologic: Pain and light touch intact in extremities.  Symmetrical.  Speech is fluent. Motor exam as listed above. Psychiatric: Judgment intact, Mood & affect appropriate for pt's clinical situation. Dermatologic:  No changes consistent with cellulitis. Lymph : No Cervical lymphadenopathy, no lichenification or skin changes of chronic lymphedema.       ASSESSMENT AND PLAN:  1. Hx of BKA, left ([place]) The patient initially went to her primary care physician Dr. all staples removed today.  Patient had a well.  Steri-Strips were placed near the medial portion.  We will continue to watch the area of patellar necrosis.  Patient will follow-up in 6 weeks for wound evaluation.  2. Primary osteoarthritis of both knees Continue NSAID medications as already ordered, these medications have been reviewed and there are no changes at this time.  Continued activity and therapy was stressed.   3. Controlled type 2 diabetes mellitus with diabetic nephropathy, without long-term current use of insulin ([place]) Continue  hypoglycemic medications as already ordered, these medications have been reviewed and there are  no changes at this time.  Hgb A1C to be monitored as already arranged by primary service   4. Chronic obstructive pulmonary disease, unspecified COPD type ([place]) Continue pulmonary medications and aerosols as already ordered, these medications have been reviewed and there are no changes at this time.    5. BKA stump complication ([place]) Small area of dehiscence near the lateral portion of the stump.  Instructed patient and caretaker to change the wound daily.  They will also place Santyl on the wound and do wet-to-dry dressings.  We will also reach out to the home health company in order to facilitate this information as well. - collagenase (SANTYL) ointment; Apply 1 application topically daily.  Dispense: 30 g; Refill: 1  Current Outpatient Medications on File Prior to Visit  Medication Sig Dispense Refill  . acetaminophen (TYLENOL) 650 MG CR tablet Take 650 mg by mouth every 8 (eight) hours as needed for pain.     Marland Kitchen albuterol (PROAIR HFA) 108 (90 Base) MCG/ACT inhaler USE 1 TO 2 INHALATIONS EVERY 4 HOURS AS NEEDED (Patient taking differently: Inhale 1-2 puffs into the lungs every 4 (four) hours as needed for wheezing or shortness of breath. ) 25.5 g 3  . ALPRAZolam (XANAX) 0.25 MG tablet Take 1 tablet (0.25 mg total) by mouth at bedtime as needed for anxiety. 5 tablet 0  . Amino Acids-Protein Hydrolys (FEEDING SUPPLEMENT, PRO-STAT SUGAR FREE 64,) LIQD Take 30 mLs by mouth 3 (three) times daily with meals.    Marland Kitchen atorvastatin (LIPITOR) 40 MG tablet TAKE 1 TABLET DAILY 90 tablet 0  . citalopram (CELEXA) 10 MG tablet TAKE 1 TABLET DAILY (Patient taking differently: Take 10 mg by mouth daily. ) 90 tablet 0  . COLCRYS 0.6 MG tablet TAKE 1 TABLET TWICE A DAY AS NEEDED (ACUTE GOUT FLARE) USE AS DIRECTED (Patient taking differently: Take 0.6 mg by mouth 2 (two) times daily as needed (acute gout flare). ) 90  tablet 7  . COMBIGAN 0.2-0.5 % ophthalmic solution Place 1 drop into both eyes 2 (two) times a day.     . diclofenac sodium (VOLTAREN) 1 % GEL Apply 4 g topically 4 (four) times daily. 5 Tube 5  . docusate sodium (COLACE) 100 MG capsule Take 1 capsule (100 mg total) by mouth 2 (two) times daily. 10 capsule 0  . dorzolamide (TRUSOPT) 2 % ophthalmic solution Place 1 drop into both eyes 3 (three) times daily.     Marland Kitchen ELIQUIS 5 MG TABS tablet TAKE 1 TABLET TWICE A DAY (Patient taking differently: Take 5 mg by mouth 2 (two) times daily. ) 180 tablet 1  . ezetimibe (ZETIA) 10 MG tablet TAKE 1 TABLET DAILY 90 tablet 0  . finasteride (PROSCAR) 5 MG tablet TAKE 1 TABLET DAILY 90 tablet 0  . gabapentin (NEURONTIN) 100 MG capsule TAKE 1 CAPSULE THREE TIMES A DAY (Patient taking differently: Take 100 mg by mouth 3 (three) times daily. ) 270 capsule 1  . glipiZIDE (GLUCOTROL) 5 MG tablet Take 5 mg by mouth 2 (two) times daily before a meal.    . insulin detemir (LEVEMIR) 100 UNIT/ML injection Inject 0.28 mLs (28 Units total) into the skin daily at 10 pm. (Patient taking differently: Inject 42 Units into the skin daily at 10 pm. ) 10 mL 11  . insulin lispro (HUMALOG) 100 UNIT/ML injection Inject 0.04 mLs (4 Units total) into the skin 3 (three) times daily with meals. (Patient taking differently: Inject 10-18 Units into the skin 3 (  three) times daily with meals. 18 am, 20 lunch, 16 supper) 10 mL 11  . Insulin Pen Needle (BD PEN NEEDLE NANO U/F) 32G X 4 MM MISC USE THREE TIMES A DAY FOR INSULIN ADMINISTRATION 270 each 4  . Insulin Syringe-Needle U-100 30G X 1/2" 1 ML MISC 1 each by Does not apply route 3 (three) times daily. 200 each 5  . latanoprost (XALATAN) 0.005 % ophthalmic solution Place 1 drop into both eyes at bedtime.     Marland Kitchen levothyroxine (SYNTHROID) 50 MCG tablet TAKE 1 TABLET DAILY BEFORE BREAKFAST (Patient taking differently: Take 50 mcg by mouth daily before breakfast. ) 90 tablet 0  . magnesium citrate  SOLN Take 296 mLs (1 Bottle total) by mouth daily as needed for severe constipation. 195 mL 0  . metolazone (ZAROXOLYN) 5 MG tablet Take 1 tablet (5 mg total) by mouth daily as needed (swelling). 90 tablet 3  . metoprolol succinate (TOPROL-XL) 50 MG 24 hr tablet TAKE 1 TABLET TWICE A DAY (Patient taking differently: Take 50 mg by mouth 2 (two) times a day. ) 180 tablet 1  . montelukast (SINGULAIR) 10 MG tablet TAKE 1 TABLET AT BEDTIME (Patient taking differently: Take 10 mg by mouth at bedtime. ) 90 tablet 4  . Multiple Vitamin (MULTIVITAMIN) capsule Take 1 capsule by mouth daily.    . nitroGLYCERIN (NITROSTAT) 0.4 MG SL tablet Place 0.4 mg under the tongue every 5 (five) minutes as needed for chest pain.     . Oxycodone HCl 10 MG TABS Take 1 tablet (10 mg total) by mouth every 6 (six) hours as needed for moderate pain or severe pain. 20 tablet 0  . polyethylene glycol (MIRALAX / GLYCOLAX) 17 g packet Take 17 g by mouth daily. 14 each 0  . potassium chloride SA (K-DUR,KLOR-CON) 20 MEQ tablet Take 4 tablets (80 meq) by mouth twice daily, take an extra 1 tablet (20 meq) on the days you take metolazone (Patient taking differently: Take 80 mEq by mouth 2 (two) times daily. Take 4 tablets (80 meq) by mouth twice daily at breakfast and at lunch, take an extra 1 tablet (20 meq) on the days you take metolazone put tablet in applesauce to aid swallowing) 732 tablet 3  . predniSONE (DELTASONE) 10 MG tablet TAKE 1 TABLET DAILY WITH BREAKFAST 90 tablet 3  . silver sulfADIAZINE (SILVADENE) 1 % cream Apply 1 application topically daily.    . SYMBICORT 160-4.5 MCG/ACT inhaler USE 2 INHALATIONS TWICE A DAY 30.6 g 0  . tamsulosin (FLOMAX) 0.4 MG CAPS capsule TAKE 1 CAPSULE DAILY (Patient taking differently: Take 0.4 mg by mouth daily. ) 90 capsule 0  . torsemide (DEMADEX) 20 MG tablet Take 40 mg by mouth 2 (two) times daily.    . traZODone (DESYREL) 50 MG tablet Take 2 tablets (100 mg total) by mouth at bedtime. 180  tablet 0  . umeclidinium bromide (INCRUSE ELLIPTA) 62.5 MCG/INH AEPB Inhale 1 puff into the lungs daily.    . vitamin C (ASCORBIC ACID) 250 MG tablet Take 250 mg by mouth 2 (two) times daily.     No current facility-administered medications on file prior to visit.     There are no Patient Instructions on file for this visit. Return in about 6 weeks (around 12/26/2018).   Kris Hartmann, NP  This note was completed with Sales executive.  Any errors are purely unintentional.

## 2018-11-15 ENCOUNTER — Telehealth (INDEPENDENT_AMBULATORY_CARE_PROVIDER_SITE_OTHER): Payer: Self-pay

## 2018-11-15 NOTE — Telephone Encounter (Signed)
Pharmacy has been made aware with requested information

## 2018-11-15 NOTE — Telephone Encounter (Signed)
He has one about 1.5 in x 1 in on his left lateral stump

## 2018-11-16 ENCOUNTER — Encounter: Payer: Self-pay | Admitting: Internal Medicine

## 2018-11-16 DIAGNOSIS — F419 Anxiety disorder, unspecified: Secondary | ICD-10-CM

## 2018-11-17 ENCOUNTER — Other Ambulatory Visit: Payer: Self-pay | Admitting: Internal Medicine

## 2018-11-17 NOTE — Telephone Encounter (Signed)
He has been getting xanax from multiple providers. Can you pull up controlled substance registry and print for me to review?

## 2018-11-17 NOTE — Telephone Encounter (Signed)
Xanax last filled by you 07/2018 and Trazodone last filled 02/2018 by you... the Rx currently in chart says "no print"... please advise

## 2018-11-18 ENCOUNTER — Other Ambulatory Visit: Payer: Self-pay | Admitting: Internal Medicine

## 2018-11-18 NOTE — Patient Instructions (Signed)
Stump and Prosthesis Care When an arm or leg is removed, it is important to care for the artificial body part that replaces it (prosthesis) and for the remaining end of the arm or leg (stump). Caring for the stump and prosthesis will help you stay comfortable, active, and healthy. How to care for your stump Cleaning your skin  Wash your stump with a mild antibacterial soap at least once each day.  Wash your stump after getting dirty or sweaty.  After washing your stump, pat it dry and let it air-dry for 5-10 minutes.  Do not soak your stump in a warm or hot bath for longer than 20 minutes at a time.  Avoid shaving hair on the stump. Hair that grows out after being shaved is more easily irritated by the prosthesis. Using skin care products  Apply ointment to your surgical scar if your health care provider told you to do so. This can keep the scar soft and help it heal.  Do not put creams and lotions on your stump unless directed by your health care provider. If your health care provider says it is okay to put creams and lotions on your stump, do not use lotions that contain petroleum jelly.  Do not use skin care products with an alcohol base. These products can be harmful to your skin. They can also damage the lining of the prosthesis.  Consider using an antiperspirant spray on the skin of the stump if you are prone to sweating. Other instructions  Every day, look closely at the skin on your stump. Use a mirror with a long handle to check areas you cannot see, or ask a friend or family member to check those areas. Look for areas that appear reddish, swollen, or irritated. Pay extra attention to places where the stump and prosthesis rub together. Tell your health care provider if you have concerns.  Wear the elastic wrap on your stump as told by your health care provider. The wrap may become loose or your stump may swell when you are active. Rewrap the bandage every couple of hours as  needed. How to care for your prosthesis Cleaning your prosthesis  Use hot water and antibacterial soap to wash your prosthesis.  Dry the prosthesis using a clean towel. Attaching your prosthesis  Make sure your prosthesis is clean before you attach it to your stump. Clean and dry all the parts that touch your skin.  Wear socks or wraps under the prosthesis.  Attach your prosthesis to your stump. Be sure you understand how to attach it. A prosthetic specialist (prosthetist) can show you how to do this. It is a good idea to practice several times while he or she watches. Other instructions      Exercise and move your prosthesis as told by your physical therapist.  Follow your health care provider's instructions about the length of time you should wear your prosthesis. You may be instructed to: ? Limit the amount of time you wear your prosthesis at first. ? Increase the time you wear your prosthesis a little bit each day. Where to find more information  The Amputee Coalition: https://www.amputee-coalition.org/ Contact a health care provider if:  The prosthesis does not seem to fit correctly.  You have an itchy rash or a sore on your stump.  Sweating between the stump and the prosthesis is heavy, and efforts to control the sweating do not work. Get help right away if:  Your stump is painful to the touch,  or it is red, swollen, or hot.  A bad smell develops around the stump.  There is a sore on your stump that is not healing.  Your stump is colder than the upper part of your limb.  Skin on your stump turns gray or black.  There is drainage coming from your stump. Summary  When an arm or leg is removed, it is important to care for the artificial body part that replaces it (prosthesis) and for the remaining end of the arm or leg (stump).  Wash your stump with a mild antibacterial soap at least once each day or if it gets dirty or sweaty. Then, pat it dry and let it air-dry  for 5-10 minutes.  Follow your health care provider's instructions on the use of skin care products on your stump.  Every day, closely check the skin on your stump. Look for areas that appear reddish, swollen, or irritated. Tell your health care provider if you have concerns.  Make sure your prosthesis is clean before you attach it to your stump. Clean and dry all the parts that touch your skin. This information is not intended to replace advice given to you by your health care provider. Make sure you discuss any questions you have with your health care provider. Document Released: 06/23/2009 Document Revised: 05/18/2017 Document Reviewed: 05/18/2017 Elsevier Patient Education  2020 [employer].

## 2018-11-20 MED ORDER — ALPRAZOLAM 0.25 MG PO TABS: 0.2500 mg | ORAL_TABLET | Freq: Every evening | ORAL | 0 refills | Status: AC | PRN

## 2018-11-20 MED ORDER — TRAZODONE HCL 50 MG PO TABS: 100.0000 mg | ORAL_TABLET | Freq: Every day | ORAL | 0 refills | Status: DC

## 2018-11-20 NOTE — Telephone Encounter (Signed)
Below are the results from [facility]    10/22/2018 2 10/22/2018 Oxycodone Hcl 10 Mg Tablet 60.00 15 Da Bro [id number] Med (8705) 0/0 60.00 MME Medicare [place] 10/20/2018 2 10/20/2018 Oxycodone Hcl 10 Mg Tablet 12.00 3 Da Artis Delay [id number] Med (8705) 0/0 60.00 MME Medicare [place] 10/13/2018 2 10/13/2018 Alprazolam 0.25 Mg Tablet 5.00 5 Sr Sud [id number] Med (8705) 0/0 0.50 LME Medicare [place] 10/13/2018 2 10/13/2018 Oxycodone Hcl 10 Mg Tablet 20.00 5 Sr Sud [id number] Med (8705) 0/0 60.00 MME Medicare [place] 07/26/2018 1 07/19/2018 Alprazolam 0.25 Mg Tablet 90.00 90 Re Bai [id number] Exp (4317) 0/0 0.50 LME Comm Ins [place] 07/02/2018 1 06/29/2018 Tramadol Hcl 50 Mg Tablet 180.00 90 Re Bai [id number] Exp (4317) 0/0 10.00 MME Comm Ins [place] 05/10/2018 1 05/08/2018 Alprazolam 0.25 Mg Tablet 90.00 90 Re Bai [id number] Exp (4317) 0/0 0.50 LME Comm Ins [place] 04/24/2018 3 04/24/2018 Hydromet Syrup 120.00 8 Re Bai [id number] Wal (7587) 0/0 15.00 MME Comm Ins [place] 04/13/2018 3 04/13/2018 Hydromet Syrup 120.00 8 Re Bai [id number] Wal (7587) 0/0 15.00 MME Comm Ins [place] 03/29/2018 1 03/23/2018 Tramadol Hcl 50 Mg Tablet 180.00 90 Re Bai [id number] Exp (9847) 0/0 10.00 MME Comm Ins [place] 02/05/2018 1 01/25/2018 Alprazolam 0.25 Mg Tablet 90.00 90 Re Bai [id number] Exp (4317) 0/0 0.50 LME Comm Ins [place]

## 2018-11-22 ENCOUNTER — Encounter: Payer: Self-pay | Admitting: Internal Medicine

## 2018-11-23 DIAGNOSIS — Z7901 Long term (current) use of anticoagulants: Secondary | ICD-10-CM

## 2018-11-23 DIAGNOSIS — S51812D Laceration without foreign body of left forearm, subsequent encounter: Secondary | ICD-10-CM

## 2018-11-23 DIAGNOSIS — J449 Chronic obstructive pulmonary disease, unspecified: Secondary | ICD-10-CM

## 2018-11-23 DIAGNOSIS — I2510 Atherosclerotic heart disease of native coronary artery without angina pectoris: Secondary | ICD-10-CM

## 2018-11-23 DIAGNOSIS — M6281 Muscle weakness (generalized): Secondary | ICD-10-CM

## 2018-11-23 DIAGNOSIS — I5032 Chronic diastolic (congestive) heart failure: Secondary | ICD-10-CM

## 2018-11-23 DIAGNOSIS — I110 Hypertensive heart disease with heart failure: Secondary | ICD-10-CM

## 2018-11-23 DIAGNOSIS — E1151 Type 2 diabetes mellitus with diabetic peripheral angiopathy without gangrene: Secondary | ICD-10-CM

## 2018-11-23 DIAGNOSIS — S81012D Laceration without foreign body, left knee, subsequent encounter: Secondary | ICD-10-CM

## 2018-11-23 DIAGNOSIS — Z89512 Acquired absence of left leg below knee: Secondary | ICD-10-CM

## 2018-11-23 DIAGNOSIS — Z4801 Encounter for change or removal of surgical wound dressing: Secondary | ICD-10-CM

## 2018-11-23 DIAGNOSIS — R531 Weakness: Secondary | ICD-10-CM

## 2018-11-23 DIAGNOSIS — R2689 Other abnormalities of gait and mobility: Secondary | ICD-10-CM

## 2018-11-23 DIAGNOSIS — Z794 Long term (current) use of insulin: Secondary | ICD-10-CM

## 2018-11-23 DIAGNOSIS — Z4781 Encounter for orthopedic aftercare following surgical amputation: Secondary | ICD-10-CM

## 2018-11-23 DIAGNOSIS — I482 Chronic atrial fibrillation, unspecified: Secondary | ICD-10-CM

## 2018-11-23 DIAGNOSIS — E1140 Type 2 diabetes mellitus with diabetic neuropathy, unspecified: Secondary | ICD-10-CM

## 2018-11-24 ENCOUNTER — Telehealth: Payer: Self-pay | Admitting: Cardiovascular Disease

## 2018-11-24 NOTE — Telephone Encounter (Signed)
Virtual Visit Pre-Appointment Phone Call  "(Name), I am calling you today to discuss your upcoming appointment. We are currently trying to limit exposure to the virus that causes COVID-19 by seeing patients at home rather than in the office."  1. "What is the BEST phone number to call the day of the visit?" - include this in appointment notes  2. Do you have or have access to (through a family member/friend) a smartphone with video capability that we can use for your visit?" a. If yes - list this number in appt notes as cell (if different from BEST phone #) and list the appointment type as a VIDEO visit in appointment notes b. If no - list the appointment type as a PHONE visit in appointment notes  3. Confirm consent - "In the setting of the current Covid19 crisis, you are scheduled for a (phone or video) visit with your provider on (date) at (time).  Just as we do with many in-office visits, in order for you to participate in this visit, we must obtain consent.  If you'd like, I can send this to your mychart (if signed up) or email for you to review.  Otherwise, I can obtain your verbal consent now.  All virtual visits are billed to your insurance company just like a normal visit would be.  By agreeing to a virtual visit, we'd like you to understand that the technology does not allow for your provider to perform an examination, and thus may limit your provider's ability to fully assess your condition. If your provider identifies any concerns that need to be evaluated in person, we will make arrangements to do so.  Finally, though the technology is pretty good, we cannot assure that it will always work on either your or our end, and in the setting of a video visit, we may have to convert it to a phone-only visit.  In either situation, we cannot ensure that we have a secure connection.  Are you willing to proceed?" STAFF: Did the patient verbally acknowledge consent to telehealth visit? Document  YES/NO here: yes  4. Advise patient to be prepared - "Two hours prior to your appointment, go ahead and check your blood pressure, pulse, oxygen saturation, and your weight (if you have the equipment to check those) and write them all down. When your visit starts, your provider will ask you for this information. If you have an Apple Watch or Kardia device, please plan to have heart rate information ready on the day of your appointment. Please have a pen and paper handy nearby the day of the visit as well."  5. Give patient instructions for MyChart download to smartphone OR Doximity/Doxy.me as below if video visit (depending on what platform provider is using)  6. Inform patient they will receive a phone call 15 minutes prior to their appointment time (may be from unknown caller ID) so they should be prepared to answer    TELEPHONE CALL NOTE  [name]. has been deemed a candidate for a follow-up tele-health visit to limit community exposure during the Covid-19 pandemic. I spoke with the patient via phone to ensure availability of phone/video source, confirm preferred email & phone number, and discuss instructions and expectations.  I reminded [name]. to be prepared with any vital sign and/or heart rhythm information that could potentially be obtained via home monitoring, at the time of his visit. I reminded [name]. to expect a phone  call prior to his visit.  Clarisse Gouge 11/24/2018 11:30 AM   INSTRUCTIONS FOR DOWNLOADING THE MYCHART APP TO SMARTPHONE  - The patient must first make sure to have activated MyChart and know their login information - If Apple, go to [employer] and type in MyChart in the search bar and download the app. If Android, ask patient to go to [employer] and type in [place] in the search bar and download the app. The app is free but as with any other app downloads, their phone may require them to verify saved payment information  or Apple/Android password.  - The patient will need to then log into the app with their MyChart username and password, and select  as their healthcare provider to link the account. When it is time for your visit, go to the MyChart app, find appointments, and click Begin Video Visit. Be sure to Select Allow for your device to access the Microphone and Camera for your visit. You will then be connected, and your provider will be with you shortly.  **If they have any issues connecting, or need assistance please contact MyChart service desk [phone])**  **If using a computer, in order to ensure the best quality for their visit they will need to use either of the following Internet Browsers: [employer], or Google Chrome**  IF USING DOXIMITY or DOXY.ME - The patient will receive a link just prior to their visit by text.     FULL LENGTH CONSENT FOR TELE-HEALTH VISIT   I hereby voluntarily request, consent and authorize [facility] and its employed or contracted physicians, physician assistants, nurse practitioners or other licensed health care professionals (the Practitioner), to provide me with telemedicine health care services (the Services") as deemed necessary by the treating Practitioner. I acknowledge and consent to receive the Services by the Practitioner via telemedicine. I understand that the telemedicine visit will involve communicating with the Practitioner through live audiovisual communication technology and the disclosure of certain medical information by electronic transmission. I acknowledge that I have been given the opportunity to request an in-person assessment or other available alternative prior to the telemedicine visit and am voluntarily participating in the telemedicine visit.  I understand that I have the right to withhold or withdraw my consent to the use of telemedicine in the course of my care at any time, without affecting my right to future  care or treatment, and that the Practitioner or I may terminate the telemedicine visit at any time. I understand that I have the right to inspect all information obtained and/or recorded in the course of the telemedicine visit and may receive copies of available information for a reasonable fee.  I understand that some of the potential risks of receiving the Services via telemedicine include:   Delay or interruption in medical evaluation due to technological equipment failure or disruption;  Information transmitted may not be sufficient (e.g. poor resolution of images) to allow for appropriate medical decision making by the Practitioner; and/or   In rare instances, security protocols could fail, causing a breach of personal health information.  Furthermore, I acknowledge that it is my responsibility to provide information about my medical history, conditions and care that is complete and accurate to the best of my ability. I acknowledge that Practitioner's advice, recommendations, and/or decision may be based on factors not within their control, such as incomplete or inaccurate data provided by me or distortions of diagnostic images or specimens that may result from  electronic transmissions. I understand that the practice of medicine is not an exact science and that Practitioner makes no warranties or guarantees regarding treatment outcomes. I acknowledge that I will receive a copy of this consent concurrently upon execution via email to the email address I last provided but may also request a printed copy by calling the office of Agency.    I understand that my insurance will be billed for this visit.   I have read or had this consent read to me.  I understand the contents of this consent, which adequately explains the benefits and risks of the Services being provided via telemedicine.   I have been provided ample opportunity to ask questions regarding this consent and the Services and have  had my questions answered to my satisfaction.  I give my informed consent for the services to be provided through the use of telemedicine in my medical care  By participating in this telemedicine visit I agree to the above.

## 2018-11-27 ENCOUNTER — Ambulatory Visit (INDEPENDENT_AMBULATORY_CARE_PROVIDER_SITE_OTHER): Payer: Medicare Other | Admitting: Internal Medicine

## 2018-11-27 ENCOUNTER — Encounter: Payer: Self-pay | Admitting: Internal Medicine

## 2018-11-27 DIAGNOSIS — R269 Unspecified abnormalities of gait and mobility: Secondary | ICD-10-CM | POA: Diagnosis not present

## 2018-11-27 DIAGNOSIS — Z89512 Acquired absence of left leg below knee: Secondary | ICD-10-CM | POA: Diagnosis not present

## 2018-11-27 NOTE — Patient Instructions (Signed)
Leg Amputation, Care After This sheet gives you information about how to care for yourself after your procedure. Your health care provider may also give you more specific instructions. If you have problems or questions, contact your health care provider. What can I expect after the procedure? After the procedure, it is common to have:  A little blood or fluid coming from your incision.  Pain from your incision.  Pain that feels like it is coming from the leg that has been removed (phantom pain). This can last for a year or longer.  Skin breakdown on your stump (residual limb).  Feelings of depression, anxiety, and fear. Follow these instructions at home: Medicines  Take over-the-counter and prescription medicines only as told by your health care provider.  If you were prescribed an antibiotic medicine, take it as told by your health care provider. Do not stop taking the antibiotic even if you start to feel better. Bathing  Do not take baths, swim, use a hot tub, or get your residual limb wet until your health care provider approves. You may only be allowed to take sponge baths.  Ask your health care provider when you may start taking showers. After taking a shower, make sure to rinse and dry your residual limb carefully. Incision care   Check your residual limb, especially your incision area, every day. Check for: ? More redness, swelling, or pain. ? More fluid or blood. ? Warmth. ? Pus or a bad smell. ? Blisters. ? Scrapes.  Follow instructions from your health care provider about how to take care of your incision. Make sure you: ? Wash your hands with soap and water before you change your bandage (dressing). If soap and water are not available, use hand sanitizer. ? Change your dressing as told by your health care provider. ? Leave stitches (sutures), skin glue, or adhesive strips in place. These skin closures may need to stay in place for 2 weeks or longer. If adhesive strip  edges start to loosen and curl up, you may trim the loose edges. Do not remove adhesive strips completely unless your health care provider tells you to do that. Activity  Return to your normal activities as told by your health care provider. Ask your health care provider what activities are safe for you.  Do physical therapy exercises as told by your health care provider.  If you have been fitted with an artificial leg (prosthesis) or have been given crutches, use them as told by your health care provider. Eating and drinking  Eat a healthy diet that includes whole grains, fruits and vegetables, low-fat dairy products, and lean proteins.  Drink enough fluid to keep your urine pale yellow. Driving  Work with an occupational therapist to learn new strategies for safe driving with an amputation.  Do not drive or use heavy equipment while taking prescription pain medicine. General instructions  To prevent or treat constipation while you are taking prescription pain medicine, your health care provider may recommend that you: ? Drink enough fluid to keep your urine pale yellow. ? Take over-the-counter or prescription medicines. ? Eat foods that are high in fiber, such as fresh fruits and vegetables, whole grains, and beans. ? Limit foods that are high in fat and processed sugars, such as fried and sweet foods.  Do not use oils, lotion, cream, or rubbing alcohol on the remaining part of your leg.  Wear compression stockings as told by your health care provider.  If you have trouble coping  with your amputation, contact your health care provider. Some feelings of depression, anxiety, or fear are normal after an amputation, but if you struggle with these feelings or if they get overwhelming, your provider may be able to recommend a therapist or support group to help you.  Do not use any products that contain nicotine or tobacco, such as cigarettes and e-cigarettes. These can delay bone healing.  If you need help quitting, ask your health care provider.  Keep all follow-up visits as told by your health care provider. This is important. Contact a health care provider if:  You have a fever.  You have more tenderness in your residual limb.  You have a rash or itchy skin.  You have a cough or chills and you feel achy and weak.  You have trouble coping with your amputation.  You have blisters or scrapes on your residual limb. Get help right away if:  You have severe pain in your residual limb.  You have more redness, swelling, or pain around your incision.  You have more fluid or blood coming from your incision.  Your incision feels warm to the touch, tender, and painful.  You have pus or a bad smell coming from your incision.  You feel light-headed and have shortness of breath.  You have blood-soaked bandages.  You cough up blood.  You have chest pain or pain when taking a deep breath or coughing. If you have these symptoms, do not drive yourself to the hospital. Call emergency services right away. If you ever feel like you may hurt yourself or others, or have thoughts about taking your own life, get help right away. You can go to your nearest emergency department or call:  Your local emergency services (911 in the [place]  A suicide crisis helpline, such as the [facility] at (810)477-9011. This is open 24 hours a day. Summary  After a leg amputation, you may have pain that feels like it is coming from the leg that was removed (phantom pain). This can last for a year or longer.  Follow instructions from your health care provider about how to take care of your incision.  Check your residual limb, especially your incision area, every day. More redness, swelling, or pain may be a sign of infection.  Contact your health care provider if you have trouble coping with your amputation. This information is not intended to replace advice given to  you by your health care provider. Make sure you discuss any questions you have with your health care provider. Document Released: 07/07/2016 Document Revised: 07/07/2016 Document Reviewed: 07/07/2016 Elsevier Patient Education  2020 [employer].

## 2018-11-27 NOTE — Progress Notes (Signed)
Virtual Visit via Telephone Note  I connected with [name]. on 11/27/18 at 10:00 AM EDT by telephone and verified that I am speaking with the correct person using two identifiers.  Location: Patient: Home Provider: Office   I discussed the limitations, risks, security and privacy concerns of performing an evaluation and management service by telephone and the availability of in person appointments. I also discussed with the patient that there may be a patient responsible charge related to this service. The patient expressed understanding and agreed to proceed.   History of Present Illness:  Pt requesting order for home hospital bed. 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 He is now at home. He has gotten a DME order for a motorized wheelchair. His family also got him a hospital bed but did not go through insurance so they are paying out of pocket. He reports the hospital bed makes it easier for him to transfer in and out of bed. It also helps him keep his stump elevated. He would like to request a trapeze bar for easier transferring as well.  Past Medical History:  Diagnosis Date  . Arthritis   . CAD    a. MI 01/29/1996 tx'd w/ TPA @ [facility]; b. Myoview 06/2005: EF 50%, scar @ apex, mild peri-infarct ischemia  . Cancer ([place])    skin  . Chronic atrial fibrillation    a. since 2006; b. on warfarin  . Chronic diastolic CHF (congestive heart failure) ([place])    a. echo 04/2006: EF lower limits of nl, mod LVH, mild aortic root dilatation, & mild MR, biatrial enlargement; b. echo 04/2013: EF 60%, mod dilated LA, mild MR & TR, mod pulm HTN w/ RV systolic pressure 53, c. echo 04/21/14: EF 55-60%, unable to exclude WMA, severely dilated LA 6.6 cm, nl RVSP, mildly dilated aortic root  . COPD (chronic obstructive pulmonary disease) ([facility])    oxygen prn at home  . CVA 5427,0623   x2  . DM   . Falls   . GERD (gastroesophageal reflux  disease)   . History of kidney stones   . HYPERLIPIDEMIA   . HYPERTENSION   . Kidney stone    a. s/p left ureteral stenting 04/24/14  . Left arm weakness    limited movement. S/P fall injury  . Neuropathy of both feet   . Poor balance   . Wears dentures    full upper and lower    Current Outpatient Medications  Medication Sig Dispense Refill  . acetaminophen (TYLENOL) 650 MG CR tablet Take 650 mg by mouth every 8 (eight) hours as needed for pain.     Marland Kitchen albuterol (PROAIR HFA) 108 (90 Base) MCG/ACT inhaler USE 1 TO 2 INHALATIONS EVERY 4 HOURS AS NEEDED (Patient taking differently: Inhale 1-2 puffs into the lungs every 4 (four) hours as needed for wheezing or shortness of breath. ) 25.5 g 3  . ALPRAZolam (XANAX) 0.25 MG tablet Take 1 tablet (0.25 mg total) by mouth at bedtime as needed for anxiety. 90 tablet 0  . Amino Acids-Protein Hydrolys (FEEDING SUPPLEMENT, PRO-STAT SUGAR FREE 64,) LIQD Take 30 mLs by mouth 3 (three) times daily with meals.    Marland Kitchen atorvastatin (LIPITOR) 40 MG tablet TAKE 1 TABLET DAILY 90 tablet 0  . citalopram (CELEXA) 10 MG tablet TAKE 1 TABLET DAILY (Patient taking differently: Take 10 mg by mouth daily. ) 90 tablet 0  . COLCRYS 0.6 MG tablet  TAKE 1 TABLET TWICE A DAY AS NEEDED (ACUTE GOUT FLARE) USE AS DIRECTED (Patient taking differently: Take 0.6 mg by mouth 2 (two) times daily as needed (acute gout flare). ) 90 tablet 7  . collagenase (SANTYL) ointment Apply 1 application topically daily. 30 g 1  . COMBIGAN 0.2-0.5 % ophthalmic solution Place 1 drop into both eyes 2 (two) times a day.     . diclofenac sodium (VOLTAREN) 1 % GEL Apply 4 g topically 4 (four) times daily. 5 Tube 5  . docusate sodium (COLACE) 100 MG capsule Take 1 capsule (100 mg total) by mouth 2 (two) times daily. 10 capsule 0  . dorzolamide (TRUSOPT) 2 % ophthalmic solution Place 1 drop into both eyes 3 (three) times daily.     Marland Kitchen ELIQUIS 5 MG TABS tablet TAKE 1 TABLET TWICE A DAY (Patient taking  differently: Take 5 mg by mouth 2 (two) times daily. ) 180 tablet 1  . ezetimibe (ZETIA) 10 MG tablet TAKE 1 TABLET DAILY 90 tablet 0  . finasteride (PROSCAR) 5 MG tablet TAKE 1 TABLET DAILY 90 tablet 0  . gabapentin (NEURONTIN) 100 MG capsule TAKE 1 CAPSULE THREE TIMES A DAY 270 capsule 0  . glipiZIDE (GLUCOTROL) 5 MG tablet Take 5 mg by mouth 2 (two) times daily before a meal.    . insulin detemir (LEVEMIR) 100 UNIT/ML injection Inject 0.28 mLs (28 Units total) into the skin daily at 10 pm. (Patient taking differently: Inject 42 Units into the skin daily at 10 pm. ) 10 mL 11  . insulin lispro (HUMALOG) 100 UNIT/ML injection Inject 0.04 mLs (4 Units total) into the skin 3 (three) times daily with meals. (Patient taking differently: Inject 10-18 Units into the skin 3 (three) times daily with meals. 18 am, 20 lunch, 16 supper) 10 mL 11  . Insulin Pen Needle (BD PEN NEEDLE NANO U/F) 32G X 4 MM MISC USE THREE TIMES A DAY FOR INSULIN ADMINISTRATION 270 each 4  . Insulin Syringe-Needle U-100 30G X 1/2" 1 ML MISC 1 each by Does not apply route 3 (three) times daily. 200 each 5  . latanoprost (XALATAN) 0.005 % ophthalmic solution Place 1 drop into both eyes at bedtime.     Marland Kitchen levothyroxine (SYNTHROID) 50 MCG tablet TAKE 1 TABLET DAILY BEFORE BREAKFAST (Patient taking differently: Take 50 mcg by mouth daily before breakfast. ) 90 tablet 0  . magnesium citrate SOLN Take 296 mLs (1 Bottle total) by mouth daily as needed for severe constipation. 195 mL 0  . metolazone (ZAROXOLYN) 5 MG tablet Take 1 tablet (5 mg total) by mouth daily as needed (swelling). 90 tablet 3  . metoprolol succinate (TOPROL-XL) 50 MG 24 hr tablet TAKE 1 TABLET TWICE A DAY 180 tablet 0  . montelukast (SINGULAIR) 10 MG tablet TAKE 1 TABLET AT BEDTIME (Patient taking differently: Take 10 mg by mouth at bedtime. ) 90 tablet 4  . Multiple Vitamin (MULTIVITAMIN) capsule Take 1 capsule by mouth daily.    . nitroGLYCERIN (NITROSTAT) 0.4 MG SL  tablet Place 0.4 mg under the tongue every 5 (five) minutes as needed for chest pain.     . Oxycodone HCl 10 MG TABS Take 1 tablet (10 mg total) by mouth every 6 (six) hours as needed for moderate pain or severe pain. 20 tablet 0  . polyethylene glycol (MIRALAX / GLYCOLAX) 17 g packet Take 17 g by mouth daily. 14 each 0  . potassium chloride SA (K-DUR,KLOR-CON) 20 MEQ tablet Take  4 tablets (80 meq) by mouth twice daily, take an extra 1 tablet (20 meq) on the days you take metolazone (Patient taking differently: Take 80 mEq by mouth 2 (two) times daily. Take 4 tablets (80 meq) by mouth twice daily at breakfast and at lunch, take an extra 1 tablet (20 meq) on the days you take metolazone put tablet in applesauce to aid swallowing) 732 tablet 3  . predniSONE (DELTASONE) 10 MG tablet TAKE 1 TABLET DAILY WITH BREAKFAST 90 tablet 3  . silver sulfADIAZINE (SILVADENE) 1 % cream Apply 1 application topically daily.    . SYMBICORT 160-4.5 MCG/ACT inhaler USE 2 INHALATIONS TWICE A DAY 30.6 g 0  . tamsulosin (FLOMAX) 0.4 MG CAPS capsule TAKE 1 CAPSULE DAILY (Patient taking differently: Take 0.4 mg by mouth daily. ) 90 capsule 0  . torsemide (DEMADEX) 20 MG tablet Take 40 mg by mouth 2 (two) times daily.    . traZODone (DESYREL) 50 MG tablet Take 2 tablets (100 mg total) by mouth at bedtime. 180 tablet 0  . umeclidinium bromide (INCRUSE ELLIPTA) 62.5 MCG/INH AEPB Inhale 1 puff into the lungs daily.    . vitamin C (ASCORBIC ACID) 250 MG tablet Take 250 mg by mouth 2 (two) times daily.     No current facility-administered medications for this visit.     Allergies  Allergen Reactions  . Contrast Media [Iodinated Diagnostic Agents] Shortness Of Breath  . Morphine And Related Other (See Comments)    Hallucinations   . Niacin And Related Dermatitis  . Other     Other reaction(s): SHORTNESS OF BREATH  . Amlodipine Rash    Family History  Problem Relation Age of Onset  . Heart disease Mother   . Diabetes  Mother   . Heart disease Maternal Grandmother   . Diabetes Maternal Grandmother   . Cancer Neg Hx   . Stroke Neg Hx     Social History   Socioeconomic History  . Marital status: Married    Spouse name: Not on file  . Number of children: Not on file  . Years of education: Not on file  . Highest education level: Not on file  Occupational History  . Not on file  Social Needs  . Financial resource strain: Not very hard  . Food insecurity    Worry: Never true    Inability: Never true  . Transportation needs    Medical: No    Non-medical: No  Tobacco Use  . Smoking status: Former Smoker    Packs/day: 2.00    Years: 40.00    Pack years: 80.00    Types: Cigarettes    Quit date: 05/24/1990    Years since quitting: 28.5  . Smokeless tobacco: Former Systems developer    Quit date: 05/24/1990  Substance and Sexual Activity  . Alcohol use: No  . Drug use: No  . Sexual activity: Not Currently  Lifestyle  . Physical activity    Days per week: Patient refused    Minutes per session: Patient refused  . Stress: Not at all  Relationships  . Social Herbalist on phone: Patient refused    Gets together: Patient refused    Attends religious service: Patient refused    Active member of club or organization: Patient refused    Attends meetings of clubs or organizations: Patient refused    Relationship status: Patient refused  . Intimate partner violence    Fear of current or ex partner: Patient refused  Emotionally abused: Patient refused    Physically abused: Patient refused    Forced sexual activity: Patient refused  Other Topics Concern  . Not on file  Social History Narrative  . Not on file     Constitutional: Pt reports chronic fatigue. Denies fever, malaise, headache or abrupt weight changes.  Respiratory: Pt reports DOE. Denies difficulty breathing, cough or sputum production.   Cardiovascular: Pt reports swelling in right leg. Denies chest pain, chest tightness,  palpitations or swelling in the hands.  Musculoskeletal: Pt reports difficulty with gait. Denies decrease in range of motion, muscle pain or joint pain and swelling.  Neurological: Pt reports neuropathic pain of left stump. Denies dizziness, difficulty with memory, difficulty with speech.    No other specific complaints in a complete review of systems (except as listed in HPI above).  Observations/Objective:   Wt Readings from Last 3 Encounters:  11/14/18 172 lb (78 kg)  10/23/18 182 lb (82.6 kg)  10/03/18 186 lb 15.2 oz (84.8 kg)    Neurological: Alert and oriented.    BMET    Component Value Date/Time   NA 140 10/10/2018 1826   NA 143 02/21/2015 1108   NA 137 08/05/2014 1839   K 4.3 10/10/2018 1826   K 3.6 08/05/2014 1839   CL 101 10/10/2018 1826   CL 106 08/05/2014 1839   CO2 28 10/10/2018 1826   CO2 23 08/05/2014 1839   GLUCOSE 226 (H) 10/10/2018 1826   GLUCOSE 235 (H) 08/05/2014 1839   BUN 36 (H) 10/10/2018 1826   BUN 26 02/21/2015 1108   BUN 19 08/05/2014 1839   CREATININE 1.27 (H) 10/13/2018 0423   CREATININE 1.20 08/05/2014 1839   CALCIUM 9.6 10/10/2018 1826   CALCIUM 8.6 (L) 08/05/2014 1839   GFRNONAA 52 (L) 10/13/2018 0423   GFRNONAA 57 (L) 08/05/2014 1839   GFRAA 60 (L) 10/13/2018 0423   GFRAA >60 08/05/2014 1839    Lipid Panel     Component Value Date/Time   CHOL 122 02/16/2018 1126   TRIG 189.0 (H) 02/16/2018 1126   HDL 47.50 02/16/2018 1126   CHOLHDL 3 02/16/2018 1126   VLDL 37.8 02/16/2018 1126   LDLCALC 37 02/16/2018 1126    CBC    Component Value Date/Time   WBC 17.1 (H) 10/12/2018 0333   RBC 3.97 (L) 10/12/2018 0333   HGB 11.4 (L) 10/12/2018 0333   HGB 13.3 08/05/2014 1839   HCT 37.5 (L) 10/12/2018 0333   HCT 40.1 08/05/2014 1839   PLT 323 10/12/2018 0333   PLT 246 08/05/2014 1839   MCV 94.5 10/12/2018 0333   MCV 83 08/05/2014 1839   MCH 28.7 10/12/2018 0333   MCHC 30.4 10/12/2018 0333   RDW 17.2 (H) 10/12/2018 0333   RDW 16.3  (H) 08/05/2014 1839   LYMPHSABS 2.8 10/12/2018 0333   LYMPHSABS 2.3 08/05/2014 1839   MONOABS 2.1 (H) 10/12/2018 0333   MONOABS 2.0 (H) 08/05/2014 1839   EOSABS 0.0 10/12/2018 0333   EOSABS 0.1 08/05/2014 1839   BASOSABS 0.1 10/12/2018 0333   BASOSABS 0.1 08/05/2014 1839    Hgb A1C Lab Results  Component Value Date   HGBA1C 7.4 (H) 09/11/2018       Assessment and Plan:  S/p BKA, Difficulty with Gait:  DME for Hospital Bed with [facility] Will fax note and order to [place] He will let me know if anything further is needed  Return precautions discussed  Follow Up Instructions:  I discussed the assessment and treatment plan with the patient. The patient was provided an opportunity to ask questions and all were answered. The patient agreed with the plan and demonstrated an understanding of the instructions.   The patient was advised to call back or seek an in-person evaluation if the symptoms worsen or if the condition fails to improve as anticipated.  I spend 8 minutes on the telephone with this patient.  Webb Silversmith, NP

## 2018-12-24 ENCOUNTER — Other Ambulatory Visit: Payer: Self-pay | Admitting: Internal Medicine

## 2018-12-25 ENCOUNTER — Telehealth (INDEPENDENT_AMBULATORY_CARE_PROVIDER_SITE_OTHER): Payer: Self-pay | Admitting: Vascular Surgery

## 2018-12-25 ENCOUNTER — Other Ambulatory Visit: Payer: Self-pay | Admitting: Internal Medicine

## 2018-12-25 NOTE — Telephone Encounter (Signed)
[name] with [facility] [phone] called to give an update on patient. Stated vertical incision had an increase in sloth since her visit Friday. Wound on knee has reopened and is draining. Area around wound is red.  Patient also showed her today a wound on R outer heel. She thinks maybe its a pressure wound and requested that we look at this when patient comes in for visit.  Patient has apt in our office tomorrow at 115pm.

## 2018-12-26 ENCOUNTER — Encounter (INDEPENDENT_AMBULATORY_CARE_PROVIDER_SITE_OTHER): Payer: Self-pay | Admitting: Vascular Surgery

## 2018-12-26 ENCOUNTER — Other Ambulatory Visit: Payer: Self-pay

## 2018-12-26 ENCOUNTER — Ambulatory Visit (INDEPENDENT_AMBULATORY_CARE_PROVIDER_SITE_OTHER): Payer: Medicare Other | Admitting: Vascular Surgery

## 2018-12-26 VITALS — BP 146/89 | HR 78 | Resp 14 | Ht 66.0 in | Wt 172.0 lb

## 2018-12-26 DIAGNOSIS — I7025 Atherosclerosis of native arteries of other extremities with ulceration: Secondary | ICD-10-CM

## 2018-12-26 DIAGNOSIS — E1122 Type 2 diabetes mellitus with diabetic chronic kidney disease: Secondary | ICD-10-CM

## 2018-12-26 DIAGNOSIS — N183 Chronic kidney disease, stage 3 (moderate): Secondary | ICD-10-CM

## 2018-12-26 DIAGNOSIS — I10 Essential (primary) hypertension: Secondary | ICD-10-CM

## 2018-12-26 DIAGNOSIS — Z89512 Acquired absence of left leg below knee: Secondary | ICD-10-CM

## 2018-12-26 NOTE — Progress Notes (Signed)
Patient ID: [name]., male   DOB: [birth year], 85 y.o.   MRN: [id number]  Chief Complaint  Patient presents with  . Follow-up    HPI [name]. is a 85 y.o. male.  Patient returns in follow-up.  The medial half of his left BKA wound is well-healed.  The lateral half has a superficial ulceration with some slough that they are treating with collagenase and it is getting better.  He also has a dime to nickel sized eschar overlying his patella.  This wound is dry and has no signs of infection.  Another worrisome finding is a new heel ulceration on the right lateral heel.  He got this from pressure from being in bed and using his heel to help transfer.  It is somewhat painful.   Past Medical History:  Diagnosis Date  . Arthritis   . CAD    a. MI 01/29/1996 tx'd w/ TPA @ [facility]; b. Myoview 06/2005: EF 50%, scar @ apex, mild peri-infarct ischemia  . Cancer ([place])    skin  . Chronic atrial fibrillation    a. since 2006; b. on warfarin  . Chronic diastolic CHF (congestive heart failure) ([place])    a. echo 04/2006: EF lower limits of nl, mod LVH, mild aortic root dilatation, & mild MR, biatrial enlargement; b. echo 04/2013: EF 60%, mod dilated LA, mild MR & TR, mod pulm HTN w/ RV systolic pressure 53, c. echo 04/21/14: EF 55-60%, unable to exclude WMA, severely dilated LA 6.6 cm, nl RVSP, mildly dilated aortic root  . COPD (chronic obstructive pulmonary disease) (HCC)    oxygen prn at home  . CVA [phone]   x2  . DM   . Falls   . GERD (gastroesophageal reflux disease)   . History of kidney stones   . HYPERLIPIDEMIA   . HYPERTENSION   . Kidney stone    a. s/p left ureteral stenting 04/24/14  . Left arm weakness    limited movement. S/P fall injury  . Neuropathy of both feet   . Poor balance   . Wears dentures    full upper and lower    Past Surgical History:  Procedure Laterality Date  . AMPUTATION Left 10/06/2018   Procedure: AMPUTATION BELOW KNEE;  Surgeon: Algernon Huxley, MD;  Location: [facility];  Service: General;  Laterality: Left;  . AMPUTATION TOE Left 06/13/2018   Procedure: 1st Ray Resection Left;  Surgeon: Sharlotte Alamo, DPM;  Location: [facility];  Service: Podiatry;  Laterality: Left;  . BLADDER SURGERY     stent placement   . CARDIAC CATHETERIZATION  1997   [facility]  . CAROTID STENT INSERTION  1997  . CATARACT EXTRACTION W/PHACO Left 10/11/2017   Procedure: CATARACT EXTRACTION PHACO AND INTRAOCULAR LENS PLACEMENT ([place]) COMPLICATED LEFT DIABETIC;  Surgeon: Leandrew Koyanagi, MD;  Location: [facility];  Service: Ophthalmology;  Laterality: Left;  MALYUGIN Diabetic - insulin  . CATARACT EXTRACTION W/PHACO Right 11/30/2017   Procedure: CATARACT EXTRACTION PHACO AND INTRAOCULAR LENS PLACEMENT ([place]) COMPLICATED  RIGHT DIABETIC;  Surgeon: Leandrew Koyanagi, MD;  Location: [facility];  Service: Ophthalmology;  Laterality: Right;  Diabetic - insulin  . CIRCUMCISION  2016  . CORONARY ANGIOPLASTY  1997   s/p stent placement x 2   . CYSTOSCOPY W/ URETERAL STENT REMOVAL Left 10/09/2014   Procedure: CYSTOSCOPY WITH STENT REMOVAL;  Surgeon: Hollice Espy, MD;  Location: [facility];  Service: Urology;  Laterality: Left;  .  CYSTOSCOPY WITH STENT PLACEMENT Left 10/09/2014   Procedure: CYSTOSCOPY WITH STENT PLACEMENT;  Surgeon: Hollice Espy, MD;  Location: [facility];  Service: Urology;  Laterality: Left;  . IRRIGATION AND DEBRIDEMENT FOOT Left 09/15/2018   Procedure: IRRIGATION AND DEBRIDEMENT FOOT DELAY CLOSURE;  Surgeon: Samara Deist, DPM;  Location: [facility];  Service: Podiatry;  Laterality: Left;  . KIDNEY SURGERY  05/2013   s/p stent placement   . LOWER EXTREMITY ANGIOGRAPHY Left 06/12/2018   Procedure: Lower Extremity Angiography;  Surgeon: Algernon Huxley, MD;  Location: [facility];  Service: Cardiovascular;  Laterality: Left;  . LOWER EXTREMITY ANGIOGRAPHY Left 09/13/2018   Procedure: Lower Extremity Angiography;  Surgeon: Algernon Huxley,  MD;  Location: [facility];  Service: Cardiovascular;  Laterality: Left;  . stents ureters Bilateral   . TONSILLECTOMY AND ADENOIDECTOMY  1959  . URETEROSCOPY WITH HOLMIUM LASER LITHOTRIPSY Left 10/09/2014   Procedure: URETEROSCOPY WITH HOLMIUM LASER LITHOTRIPSY;  Surgeon: Hollice Espy, MD;  Location: [facility];  Service: Urology;  Laterality: Left;      Allergies  Allergen Reactions  . Contrast Media [Iodinated Diagnostic Agents] Shortness Of Breath  . Morphine And Related Other (See Comments)    Hallucinations   . Niacin And Related Dermatitis  . Other     Other reaction(s): SHORTNESS OF BREATH  . Amlodipine Rash    Current Outpatient Medications  Medication Sig Dispense Refill  . acetaminophen (TYLENOL) 650 MG CR tablet Take 650 mg by mouth every 8 (eight) hours as needed for pain.     Marland Kitchen albuterol (PROAIR HFA) 108 (90 Base) MCG/ACT inhaler USE 1 TO 2 INHALATIONS EVERY 4 HOURS AS NEEDED (Patient taking differently: Inhale 1-2 puffs into the lungs every 4 (four) hours as needed for wheezing or shortness of breath. ) 25.5 g 3  . ALPRAZolam (XANAX) 0.25 MG tablet Take 1 tablet (0.25 mg total) by mouth at bedtime as needed for anxiety. 90 tablet 0  . Amino Acids-Protein Hydrolys (FEEDING SUPPLEMENT, PRO-STAT SUGAR FREE 64,) LIQD Take 30 mLs by mouth 3 (three) times daily with meals.    Marland Kitchen atorvastatin (LIPITOR) 40 MG tablet TAKE 1 TABLET DAILY 90 tablet 0  . citalopram (CELEXA) 10 MG tablet TAKE 1 TABLET DAILY (Patient taking differently: Take 10 mg by mouth daily. ) 90 tablet 0  . COLCRYS 0.6 MG tablet TAKE 1 TABLET TWICE A DAY AS NEEDED (ACUTE GOUT FLARE) USE AS DIRECTED (Patient taking differently: Take 0.6 mg by mouth 2 (two) times daily as needed (acute gout flare). ) 90 tablet 7  . collagenase (SANTYL) ointment Apply 1 application topically daily. 30 g 1  . COMBIGAN 0.2-0.5 % ophthalmic solution Place 1 drop into both eyes 2 (two) times a day.     . diclofenac sodium  (VOLTAREN) 1 % GEL Apply 4 g topically 4 (four) times daily. 5 Tube 5  . docusate sodium (COLACE) 100 MG capsule Take 1 capsule (100 mg total) by mouth 2 (two) times daily. 10 capsule 0  . dorzolamide (TRUSOPT) 2 % ophthalmic solution Place 1 drop into both eyes 3 (three) times daily.     Marland Kitchen ELIQUIS 5 MG TABS tablet TAKE 1 TABLET TWICE A DAY (Patient taking differently: Take 5 mg by mouth 2 (two) times daily. ) 180 tablet 1  . ezetimibe (ZETIA) 10 MG tablet TAKE 1 TABLET DAILY 90 tablet 0  . finasteride (PROSCAR) 5 MG tablet TAKE 1 TABLET DAILY 90 tablet 0  . gabapentin (  NEURONTIN) 100 MG capsule TAKE 1 CAPSULE THREE TIMES A DAY 270 capsule 0  . glipiZIDE (GLUCOTROL) 5 MG tablet Take 5 mg by mouth 2 (two) times daily before a meal.    . insulin detemir (LEVEMIR) 100 UNIT/ML injection Inject 0.28 mLs (28 Units total) into the skin daily at 10 pm. (Patient taking differently: Inject 42 Units into the skin daily at 10 pm. ) 10 mL 11  . insulin lispro (HUMALOG) 100 UNIT/ML injection Inject 0.04 mLs (4 Units total) into the skin 3 (three) times daily with meals. (Patient taking differently: Inject 10-18 Units into the skin 3 (three) times daily with meals. 18 am, 20 lunch, 16 supper) 10 mL 11  . Insulin Pen Needle (BD PEN NEEDLE NANO U/F) 32G X 4 MM MISC USE THREE TIMES A DAY FOR INSULIN ADMINISTRATION 270 each 4  . Insulin Syringe-Needle U-100 30G X 1/2" 1 ML MISC 1 each by Does not apply route 3 (three) times daily. 200 each 5  . latanoprost (XALATAN) 0.005 % ophthalmic solution Place 1 drop into both eyes at bedtime.     Marland Kitchen levothyroxine (SYNTHROID) 50 MCG tablet TAKE 1 TABLET DAILY BEFORE BREAKFAST 90 tablet 3  . magnesium citrate SOLN Take 296 mLs (1 Bottle total) by mouth daily as needed for severe constipation. 195 mL 0  . metolazone (ZAROXOLYN) 5 MG tablet Take 1 tablet (5 mg total) by mouth daily as needed (swelling). 90 tablet 3  . metoprolol succinate (TOPROL-XL) 50 MG 24 hr tablet TAKE 1 TABLET  TWICE A DAY 180 tablet 0  . montelukast (SINGULAIR) 10 MG tablet TAKE 1 TABLET AT BEDTIME (Patient taking differently: Take 10 mg by mouth at bedtime. ) 90 tablet 4  . Multiple Vitamin (MULTIVITAMIN) capsule Take 1 capsule by mouth daily.    . nitroGLYCERIN (NITROSTAT) 0.4 MG SL tablet Place 0.4 mg under the tongue every 5 (five) minutes as needed for chest pain.     . Oxycodone HCl 10 MG TABS Take 1 tablet (10 mg total) by mouth every 6 (six) hours as needed for moderate pain or severe pain. 20 tablet 0  . polyethylene glycol (MIRALAX / GLYCOLAX) 17 g packet Take 17 g by mouth daily. 14 each 0  . potassium chloride SA (K-DUR,KLOR-CON) 20 MEQ tablet Take 4 tablets (80 meq) by mouth twice daily, take an extra 1 tablet (20 meq) on the days you take metolazone (Patient taking differently: Take 80 mEq by mouth 2 (two) times daily. Take 4 tablets (80 meq) by mouth twice daily at breakfast and at lunch, take an extra 1 tablet (20 meq) on the days you take metolazone put tablet in applesauce to aid swallowing) 732 tablet 3  . predniSONE (DELTASONE) 10 MG tablet TAKE 1 TABLET DAILY WITH BREAKFAST 90 tablet 3  . silver sulfADIAZINE (SILVADENE) 1 % cream Apply 1 application topically daily.    . SYMBICORT 160-4.5 MCG/ACT inhaler USE 2 INHALATIONS TWICE A DAY 30.6 g 0  . tamsulosin (FLOMAX) 0.4 MG CAPS capsule TAKE 1 CAPSULE DAILY 90 capsule 1  . torsemide (DEMADEX) 20 MG tablet Take 40 mg by mouth 2 (two) times daily.    . traZODone (DESYREL) 50 MG tablet Take 2 tablets (100 mg total) by mouth at bedtime. 180 tablet 0  . umeclidinium bromide (INCRUSE ELLIPTA) 62.5 MCG/INH AEPB Inhale 1 puff into the lungs daily.    . vitamin C (ASCORBIC ACID) 250 MG tablet Take 250 mg by mouth 2 (two) times daily.  No current facility-administered medications for this visit.         Physical Exam BP (!) 146/89 (BP Location: Right Arm, Patient Position: Sitting, Cuff Size: Normal)   Pulse 78   Resp 14   Ht 5\' 6"   (1.676 m)   Wt 172 lb (78 kg)   BMI 27.76 kg/m  Gen:  WD/WN, NAD Skin: Left below-knee amputation wound as described above with a wide superficial wound laterally with slough.  Superficial ulceration on the right lateral heel with fair granulation tissue and minimal surrounding erythema     Assessment/Plan:  Essential hypertension blood pressure control important in reducing the progression of atherosclerotic disease. On appropriate oral medications.   Type 2 diabetes mellitus with stage 3 chronic kidney disease (HCC) blood glucose control important in reducing the progression of atherosclerotic disease. Also, involved in wound healing. On appropriate medications.   Hx of BKA, left ([place]) Continue local wound care.  Debridement unlikely to be of any benefit at current.  Recheck in 1 month.  Atherosclerosis of native arteries of the extremities with ulceration ([place]) The new ulceration on the right heel is a particularly concerning finding given his known significant peripheral arterial disease.  Were going to continue some local wound care for the time being in hopes of trying to get this under better control but I suspect he will ultimately require intervention and he has a limb threatening situation on that side.  Organ to see him back in about a month.  He will contact our office with any problems in the interim.      Leotis Pain 12/26/2018, 1:52 PM   This note was created with Dragon medical transcription system.  Any errors from dictation are unintentional.

## 2018-12-26 NOTE — Assessment & Plan Note (Signed)
blood pressure control important in reducing the progression of atherosclerotic disease. On appropriate oral medications.  

## 2018-12-26 NOTE — Assessment & Plan Note (Signed)
Continue local wound care.  Debridement unlikely to be of any benefit at current.  Recheck in 1 month.

## 2018-12-26 NOTE — Assessment & Plan Note (Signed)
blood glucose control important in reducing the progression of atherosclerotic disease. Also, involved in wound healing. On appropriate medications.  

## 2018-12-26 NOTE — Assessment & Plan Note (Signed)
The new ulceration on the right heel is a particularly concerning finding given his known significant peripheral arterial disease.  Were going to continue some local wound care for the time being in hopes of trying to get this under better control but I suspect he will ultimately require intervention and he has a limb threatening situation on that side.  Organ to see him back in about a month.  He will contact our office with any problems in the interim.

## 2018-12-27 ENCOUNTER — Other Ambulatory Visit: Payer: Self-pay | Admitting: Internal Medicine

## 2019-01-01 ENCOUNTER — Telehealth: Payer: Self-pay | Admitting: Internal Medicine

## 2019-01-01 DIAGNOSIS — Z89512 Acquired absence of left leg below knee: Secondary | ICD-10-CM

## 2019-01-01 NOTE — Telephone Encounter (Signed)
Patient's Daughter stated that a fax was going to be sent over for a hospital bed and the bar to go across the bed She stated he is getting bills because the prescription was not received by the company for these.  She has a new fax number that the prescriptions could be sent to  [phone]

## 2019-01-02 ENCOUNTER — Telehealth (INDEPENDENT_AMBULATORY_CARE_PROVIDER_SITE_OTHER): Payer: Self-pay

## 2019-01-02 NOTE — Telephone Encounter (Signed)
RX reprinted and placed in [facility] box to fax.

## 2019-01-02 NOTE — Addendum Note (Signed)
Addended by: Jearld Fenton on: 01/02/2019 08:52 AM   Modules accepted: Orders

## 2019-01-02 NOTE — Telephone Encounter (Signed)
I left a detail message on the nurse Lilia Pro) voicemail

## 2019-01-03 ENCOUNTER — Other Ambulatory Visit: Payer: Self-pay | Admitting: Pulmonary Disease

## 2019-01-04 ENCOUNTER — Telehealth (INDEPENDENT_AMBULATORY_CARE_PROVIDER_SITE_OTHER): Payer: Self-pay | Admitting: Nurse Practitioner

## 2019-01-04 NOTE — Telephone Encounter (Signed)
Lilia Pro with [facility] [phone], calling stating that patient sx amputation has more slush and eschar with odor. No redness.  Patient has follow up apt with ABI on 10/16 but is requesting to be seen sooner. Please advise. AS, CMA

## 2019-01-04 NOTE — Telephone Encounter (Signed)
That is fine, but move him up with ABI as well

## 2019-01-04 NOTE — Telephone Encounter (Signed)
Order with progress notes have been faxed to the number given

## 2019-01-05 DIAGNOSIS — E1140 Type 2 diabetes mellitus with diabetic neuropathy, unspecified: Secondary | ICD-10-CM

## 2019-01-05 DIAGNOSIS — I482 Chronic atrial fibrillation, unspecified: Secondary | ICD-10-CM | POA: Diagnosis not present

## 2019-01-05 DIAGNOSIS — R531 Weakness: Secondary | ICD-10-CM

## 2019-01-05 DIAGNOSIS — J449 Chronic obstructive pulmonary disease, unspecified: Secondary | ICD-10-CM

## 2019-01-05 DIAGNOSIS — Z4781 Encounter for orthopedic aftercare following surgical amputation: Secondary | ICD-10-CM | POA: Diagnosis not present

## 2019-01-05 DIAGNOSIS — Z794 Long term (current) use of insulin: Secondary | ICD-10-CM

## 2019-01-05 DIAGNOSIS — R2689 Other abnormalities of gait and mobility: Secondary | ICD-10-CM

## 2019-01-05 DIAGNOSIS — M6281 Muscle weakness (generalized): Secondary | ICD-10-CM

## 2019-01-05 DIAGNOSIS — S81012D Laceration without foreign body, left knee, subsequent encounter: Secondary | ICD-10-CM

## 2019-01-05 DIAGNOSIS — I2510 Atherosclerotic heart disease of native coronary artery without angina pectoris: Secondary | ICD-10-CM | POA: Diagnosis not present

## 2019-01-05 DIAGNOSIS — E1151 Type 2 diabetes mellitus with diabetic peripheral angiopathy without gangrene: Secondary | ICD-10-CM

## 2019-01-05 DIAGNOSIS — I5032 Chronic diastolic (congestive) heart failure: Secondary | ICD-10-CM

## 2019-01-05 DIAGNOSIS — Z89512 Acquired absence of left leg below knee: Secondary | ICD-10-CM

## 2019-01-05 DIAGNOSIS — S51812D Laceration without foreign body of left forearm, subsequent encounter: Secondary | ICD-10-CM

## 2019-01-05 DIAGNOSIS — Z4801 Encounter for change or removal of surgical wound dressing: Secondary | ICD-10-CM

## 2019-01-05 DIAGNOSIS — I110 Hypertensive heart disease with heart failure: Secondary | ICD-10-CM

## 2019-01-05 DIAGNOSIS — Z7901 Long term (current) use of anticoagulants: Secondary | ICD-10-CM

## 2019-01-05 NOTE — Telephone Encounter (Signed)
Can you please contact the patient and move his apt with his ABI to sooner date-per Arna Medici. AS, CMA

## 2019-01-08 ENCOUNTER — Encounter: Payer: Self-pay | Admitting: Internal Medicine

## 2019-01-10 ENCOUNTER — Other Ambulatory Visit: Payer: Self-pay

## 2019-01-10 MED ORDER — TRAMADOL HCL 50 MG PO TABS: 50.0000 mg | ORAL_TABLET | Freq: Two times a day (BID) | ORAL | 0 refills | Status: DC

## 2019-01-10 NOTE — Telephone Encounter (Signed)
Last filled 06/29/2018.Marland KitchenMarland Kitchen#[id number]---please advise to send to mail order

## 2019-01-10 NOTE — Addendum Note (Signed)
Addended by: Lurlean Nanny on: 01/10/2019 02:12 PM   Modules accepted: Orders

## 2019-01-12 ENCOUNTER — Ambulatory Visit (INDEPENDENT_AMBULATORY_CARE_PROVIDER_SITE_OTHER): Payer: Medicare Other

## 2019-01-12 ENCOUNTER — Ambulatory Visit (INDEPENDENT_AMBULATORY_CARE_PROVIDER_SITE_OTHER): Payer: Medicare Other | Admitting: Nurse Practitioner

## 2019-01-12 ENCOUNTER — Telehealth: Payer: Self-pay

## 2019-01-12 ENCOUNTER — Encounter (INDEPENDENT_AMBULATORY_CARE_PROVIDER_SITE_OTHER): Payer: Self-pay | Admitting: Nurse Practitioner

## 2019-01-12 ENCOUNTER — Other Ambulatory Visit: Payer: Self-pay

## 2019-01-12 ENCOUNTER — Other Ambulatory Visit: Payer: Self-pay | Admitting: Internal Medicine

## 2019-01-12 ENCOUNTER — Encounter: Payer: Self-pay | Admitting: Internal Medicine

## 2019-01-12 ENCOUNTER — Other Ambulatory Visit (INDEPENDENT_AMBULATORY_CARE_PROVIDER_SITE_OTHER): Payer: Self-pay | Admitting: Nurse Practitioner

## 2019-01-12 VITALS — BP 133/85 | HR 66 | Resp 16

## 2019-01-12 DIAGNOSIS — I10 Essential (primary) hypertension: Secondary | ICD-10-CM

## 2019-01-12 DIAGNOSIS — Z89512 Acquired absence of left leg below knee: Secondary | ICD-10-CM

## 2019-01-12 DIAGNOSIS — T8149XA Infection following a procedure, other surgical site, initial encounter: Secondary | ICD-10-CM

## 2019-01-12 DIAGNOSIS — I7025 Atherosclerosis of native arteries of other extremities with ulceration: Secondary | ICD-10-CM

## 2019-01-12 DIAGNOSIS — J449 Chronic obstructive pulmonary disease, unspecified: Secondary | ICD-10-CM | POA: Diagnosis not present

## 2019-01-12 MED ORDER — CEPHALEXIN 500 MG PO CAPS: 500.0000 mg | ORAL_CAPSULE | Freq: Two times a day (BID) | ORAL | 0 refills | Status: DC

## 2019-01-12 NOTE — Telephone Encounter (Signed)
Spoke to [place] with [facility] to confirm that pt is to take medication daily not PRN for 5 day treatment... expressed understanding and stated they would document

## 2019-01-12 NOTE — Telephone Encounter (Signed)
Elberta Fortis pharmacist with [facility] left v/m wanting to verify 01/10/19 rx for tramadol 50 mg taking 1 bid for 5 days and to verify diagnosis. Also last filled March 2020 and needs verification if pt has been taking med since March.

## 2019-01-15 ENCOUNTER — Telehealth (INDEPENDENT_AMBULATORY_CARE_PROVIDER_SITE_OTHER): Payer: Self-pay | Admitting: Nurse Practitioner

## 2019-01-15 ENCOUNTER — Encounter (INDEPENDENT_AMBULATORY_CARE_PROVIDER_SITE_OTHER): Payer: Self-pay | Admitting: Nurse Practitioner

## 2019-01-15 LAB — SPECIMEN STATUS REPORT

## 2019-01-15 NOTE — Telephone Encounter (Signed)
Tiffany from [facility] called this morning stating that patient specimen could not be processed because we used the wrong swab.  I called back to [place] because we only have 1 type of swab in our office and that's the pink top aerobic swab used for wound cultures and I spoke with Beverlee Nims who said that Tiffany had contacted [place] by mistake and that the wound culture we needed was being processed and she would send over the preliminary. AS, CMA

## 2019-01-15 NOTE — Progress Notes (Signed)
SUBJECTIVE:  Patient ID: [name]., male    DOB: 07-16-1931, 85 y.o.   MRN: [id number] Chief Complaint  Patient presents with  . Follow-up    HPI  [name]. is a 85 y.o. male that presents today for evaluation of his left below-knee amputation.  The patient's caregiver states that recently the patient began to have some worsening of the wound.  The area is covered with thick copious slough which is foul-smelling.  The patient's caregiver and wound care nurse have continued to do the prescribed dressings of Santyl with wet-to-dry dressings.  The patient denies any fever, chills, nausea, vomiting or diarrhea.  He denies any chest pain or shortness of breath.  The previous wound on the patient's right heel has resolved at this time.  Today the patient's ABI on the right is 1.06 with a TBI of 0.82.  ABIs are essentially unchanged compared to the prior study on 05/31/2018.  The anterior tibial artery has monophasic waveforms with biphasic in the posterior tibial artery.  Past Medical History:  Diagnosis Date  . Arthritis   . CAD    a. MI 01/29/1996 tx'd w/ TPA @ [facility]; b. Myoview 06/2005: EF 50%, scar @ apex, mild peri-infarct ischemia  . Cancer ([place])    skin  . Chronic atrial fibrillation ([place])    a. since 2006; b. on warfarin  . Chronic diastolic CHF (congestive heart failure) ([place])    a. echo 04/2006: EF lower limits of nl, mod LVH, mild aortic root dilatation, & mild MR, biatrial enlargement; b. echo 04/2013: EF 60%, mod dilated LA, mild MR & TR, mod pulm HTN w/ RV systolic pressure 53, c. echo 04/21/14: EF 55-60%, unable to exclude WMA, severely dilated LA 6.6 cm, nl RVSP, mildly dilated aortic root  . COPD (chronic obstructive pulmonary disease) (H[facility])    oxygen prn at home  . CVA [id number]   x2  . DM   . Falls   . GERD (gastroesophageal reflux disease)   . History of kidney stones   . HYPERLIPIDEMIA   . HYPERTENSION   . Kidney stone    a. s/p left ureteral  stenting 04/24/14  . Left arm weakness    limited movement. S/P fall injury  . Neuropathy of both feet   . Poor balance   . Wears dentures    full upper and lower    Past Surgical History:  Procedure Laterality Date  . AMPUTATION Left 10/06/2018   Procedure: AMPUTATION BELOW KNEE;  Surgeon: Algernon Huxley, MD;  Location: [facility];  Service: General;  Laterality: Left;  . AMPUTATION TOE Left 06/13/2018   Procedure: 1st Ray Resection Left;  Surgeon: Sharlotte Alamo, DPM;  Location: [facility];  Service: Podiatry;  Laterality: Left;  . BLADDER SURGERY     stent placement   . CARDIAC CATHETERIZATION  1997   [facility]  . CAROTID STENT INSERTION  1997  . CATARACT EXTRACTION W/PHACO Left 10/11/2017   Procedure: CATARACT EXTRACTION PHACO AND INTRAOCULAR LENS PLACEMENT ([place]) COMPLICATED LEFT DIABETIC;  Surgeon: Leandrew Koyanagi, MD;  Location: [place];  Service: Ophthalmology;  Laterality: Left;  MALYUGIN Diabetic - insulin  . CATARACT EXTRACTION W/PHACO Right 11/30/2017   Procedure: CATARACT EXTRACTION PHACO AND INTRAOCULAR LENS PLACEMENT ([place]) COMPLICATED  RIGHT DIABETIC;  Surgeon: Leandrew Koyanagi, MD;  Location: [place];  Service: Ophthalmology;  Laterality: Right;  Diabetic - insulin  . CIRCUMCISION  2016  . [place]  s/p stent placement x 2   . CYSTOSCOPY W/ URETERAL STENT REMOVAL Left 10/09/2014   Procedure: CYSTOSCOPY WITH STENT REMOVAL;  Surgeon: Hollice Espy, MD;  Location: [facility];  Service: Urology;  Laterality: Left;  . CYSTOSCOPY WITH STENT PLACEMENT Left 10/09/2014   Procedure: CYSTOSCOPY WITH STENT PLACEMENT;  Surgeon: Hollice Espy, MD;  Location: [facility];  Service: Urology;  Laterality: Left;  . IRRIGATION AND DEBRIDEMENT FOOT Left 09/15/2018   Procedure: IRRIGATION AND DEBRIDEMENT FOOT DELAY CLOSURE;  Surgeon: Samara Deist, DPM;  Location: [facility];  Service: Podiatry;  Laterality: Left;  . KIDNEY SURGERY  05/2013   s/p stent placement    . LOWER EXTREMITY ANGIOGRAPHY Left 06/12/2018   Procedure: Lower Extremity Angiography;  Surgeon: Algernon Huxley, MD;  Location: [place] [facility];  Service: Cardiovascular;  Laterality: Left;  . LOWER EXTREMITY ANGIOGRAPHY Left 09/13/2018   Procedure: Lower Extremity Angiography;  Surgeon: Algernon Huxley, MD;  Location: [place] [facility];  Service: Cardiovascular;  Laterality: Left;  . stents ureters Bilateral   . TONSILLECTOMY AND ADENOIDECTOMY  1959  . URETEROSCOPY WITH HOLMIUM LASER LITHOTRIPSY Left 10/09/2014   Procedure: URETEROSCOPY WITH HOLMIUM LASER LITHOTRIPSY;  Surgeon: Hollice Espy, MD;  Location: [facility];  Service: Urology;  Laterality: Left;    Social History   Socioeconomic History  . Marital status: Married    Spouse name: Not on file  . Number of children: Not on file  . Years of education: Not on file  . Highest education level: Not on file  Occupational History  . Not on file  Social Needs  . Financial resource strain: Not very hard  . Food insecurity    Worry: Never true    Inability: Never true  . Transportation needs    Medical: No    Non-medical: No  Tobacco Use  . Smoking status: Former Smoker    Packs/day: 2.00    Years: 40.00    Pack years: 80.00    Types: Cigarettes    Quit date: 05/24/1990    Years since quitting: 28.6  . Smokeless tobacco: Former Systems developer    Quit date: 05/24/1990  Substance and Sexual Activity  . Alcohol use: No  . Drug use: No  . Sexual activity: Not Currently  Lifestyle  . Physical activity    Days per week: Patient refused    Minutes per session: Patient refused  . Stress: Not at all  Relationships  . Social Herbalist on phone: Patient refused    Gets together: Patient refused    Attends religious service: Patient refused    Active member of club or organization: Patient refused    Attends meetings of clubs or organizations: Patient refused    Relationship status: Patient refused  . Intimate partner violence     Fear of current or ex partner: Patient refused    Emotionally abused: Patient refused    Physically abused: Patient refused    Forced sexual activity: Patient refused  Other Topics Concern  . Not on file  Social History Narrative  . Not on file    Family History  Problem Relation Age of Onset  . Heart disease Mother   . Diabetes Mother   . Heart disease Maternal Grandmother   . Diabetes Maternal Grandmother   . Cancer Neg Hx   . Stroke Neg Hx     Allergies  Allergen Reactions  . Contrast Media [Iodinated Diagnostic Agents] Shortness Of Breath  . Morphine  And Related Other (See Comments)    Hallucinations   . Niacin And Related Dermatitis  . Other     Other reaction(s): SHORTNESS OF BREATH  . Amlodipine Rash     Review of Systems   Review of Systems: Negative Unless Checked Constitutional: [] Weight loss  [] Fever  [] Chills Cardiac: [] Chest pain   []  Atrial Fibrillation  [] Palpitations   [] Shortness of breath when laying flat   [] Shortness of breath with exertion. [] Shortness of breath at rest Vascular:  [] Pain in legs with walking   [] Pain in legs with standing [] Pain in legs when laying flat   [] Claudication    [] Pain in feet when laying flat    [] History of DVT   [] Phlebitis   [] Swelling in legs   [] Varicose veins   [] Non-healing ulcers Pulmonary:   [] Uses home oxygen   [] Productive cough   [] Hemoptysis   [] Wheeze  [x] COPD   [] Asthma Neurologic:  [] Dizziness   [] Seizures  [] Blackouts [x] History of stroke   [] History of TIA  [] Aphasia   [] Temporary Blindness   [x] Weakness or numbness in arm   [x] Weakness or numbness in leg Musculoskeletal:   [] Joint swelling   [] Joint pain   [] Low back pain  []  History of Knee Replacement [] Arthritis [] back Surgeries  []  Spinal Stenosis    Hematologic:  [] Easy bruising  [] Easy bleeding   [] Hypercoagulable state   [] Anemic Gastrointestinal:  [] Diarrhea   [] Vomiting  [] Gastroesophageal reflux/heartburn   [] Difficulty swallowing.  [] Abdominal pain Genitourinary:  [] Chronic kidney disease   [] Difficult urination  [] Anuric   [] Blood in urine [] Frequent urination  [] Burning with urination   [] Hematuria Skin:  [] Rashes   [] Ulcers [] Wounds Psychological:  [x] History of anxiety   []  History of major depression  []  Memory Difficulties      OBJECTIVE:   Physical Exam  BP 133/85 (BP Location: Right Arm)   Pulse 66   Resp 16   Gen: WD/WN, NAD Head: [place]/AT, No temporalis wasting.  Ear/Nose/Throat: Hearing grossly intact, nares w/o erythema or drainage Eyes: PER, EOMI, sclera nonicteric.  Neck: Supple, no masses.  No JVD.  Pulmonary:  Good air movement, no use of accessory muscles.  Cardiac: RRR Vascular:  Vessel Right Left  Radial Palpable Palpable  Brachial Palpable Palpable  Femoral Palpable Palpable  Popliteal Palpable Palpable  Dorsalis Pedis Palpable Palpable  Posterior Tibial Palpable Palpable   Gastrointestinal: soft, non-distended. No guarding/no peritoneal signs.  Musculoskeletal: M/S 5/5 throughout.  No deformity or atrophy.  Neurologic: Pain and light touch intact in extremities.  Symmetrical.  Speech is fluent. Motor exam as listed above. Psychiatric: Judgment intact, Mood & affect appropriate for pt's clinical situation. Dermatologic: No Venous rashes. No Ulcers Noted.  No changes consistent with cellulitis. Lymph : No Cervical lymphadenopathy, no lichenification or skin changes of chronic lymphedema.       ASSESSMENT AND PLAN:  1. Hx of BKA, left ([place]) I have sent in the patient a prescription for Keflex in order to help with postoperative infection.  We have also cultured the wound to ensure that this will be adequate.  I have advised the patient and caregiver that if it is found that a different antibiotic would work better we will contact them with instructions.  Otherwise the patient will follow-up in 6 months in order to reassess his ABIs.  Patient and caregiver are advised that if he develops  another wound on his right lower extremity that he should contact us sooner so that we can also assess  blood flow status.  2. Postoperative wound infection The patient's postoperative wound has been healing since his surgery in June.  Initially it was beginning to heal however the progression has seemed to slow.  Based on this, we will refer to the wound center to utilize different modalities to try to assist with wound healing. - Ambulatory referral to Wound Clinic  3. Chronic obstructive pulmonary disease, unspecified COPD type ([place]) Continue pulmonary medications and aerosols as already ordered, these medications have been reviewed and there are no changes at this time.    4. Essential hypertension Continue antihypertensive medications as already ordered, these medications have been reviewed and there are no changes at this time.    Current Outpatient Medications on File Prior to Visit  Medication Sig Dispense Refill  . acetaminophen (TYLENOL) 650 MG CR tablet Take 650 mg by mouth every 8 (eight) hours as needed for pain.     Marland Kitchen albuterol (PROAIR HFA) 108 (90 Base) MCG/ACT inhaler USE 1 TO 2 INHALATIONS EVERY 4 HOURS AS NEEDED (Patient taking differently: Inhale 1-2 puffs into the lungs every 4 (four) hours as needed for wheezing or shortness of breath. ) 25.5 g 3  . ALPRAZolam (XANAX) 0.25 MG tablet Take 1 tablet (0.25 mg total) by mouth at bedtime as needed for anxiety. 90 tablet 0  . Amino Acids-Protein Hydrolys (FEEDING SUPPLEMENT, PRO-STAT SUGAR FREE 64,) LIQD Take 30 mLs by mouth 3 (three) times daily with meals.    . citalopram (CELEXA) 10 MG tablet TAKE 1 TABLET DAILY 90 tablet 3  . COLCRYS 0.6 MG tablet TAKE 1 TABLET TWICE A DAY AS NEEDED (ACUTE GOUT FLARE) USE AS DIRECTED (Patient taking differently: Take 0.6 mg by mouth 2 (two) times daily as needed (acute gout flare). ) 90 tablet 7  . collagenase (SANTYL) ointment Apply 1 application topically daily. 30 g 1  . COMBIGAN 0.2-0.5  % ophthalmic solution Place 1 drop into both eyes 2 (two) times a day.     . diclofenac sodium (VOLTAREN) 1 % GEL Apply 4 g topically 4 (four) times daily. 5 Tube 5  . docusate sodium (COLACE) 100 MG capsule Take 1 capsule (100 mg total) by mouth 2 (two) times daily. 10 capsule 0  . dorzolamide (TRUSOPT) 2 % ophthalmic solution Place 1 drop into both eyes 3 (three) times daily.     Marland Kitchen ELIQUIS 5 MG TABS tablet TAKE 1 TABLET TWICE A DAY (Patient taking differently: Take 5 mg by mouth 2 (two) times daily. ) 180 tablet 1  . finasteride (PROSCAR) 5 MG tablet TAKE 1 TABLET DAILY 90 tablet 0  . gabapentin (NEURONTIN) 100 MG capsule TAKE 1 CAPSULE THREE TIMES A DAY 270 capsule 0  . glipiZIDE (GLUCOTROL) 5 MG tablet Take 5 mg by mouth 2 (two) times daily before a meal.    . INCRUSE ELLIPTA 62.5 MCG/INH AEPB INHALE 1 PUFF DAILY 90 each 3  . insulin detemir (LEVEMIR) 100 UNIT/ML injection Inject 0.28 mLs (28 Units total) into the skin daily at 10 pm. (Patient taking differently: Inject 42 Units into the skin daily at 10 pm. ) 10 mL 11  . insulin lispro (HUMALOG) 100 UNIT/ML injection Inject 0.04 mLs (4 Units total) into the skin 3 (three) times daily with meals. (Patient taking differently: Inject 10-18 Units into the skin 3 (three) times daily with meals. 18 am, 20 lunch, 16 supper) 10 mL 11  . Insulin Pen Needle (BD PEN NEEDLE NANO U/F) 32G X 4 MM MISC  USE THREE TIMES A DAY FOR INSULIN ADMINISTRATION 270 each 4  . Insulin Syringe-Needle U-100 30G X 1/2" 1 ML MISC 1 each by Does not apply route 3 (three) times daily. 200 each 5  . latanoprost (XALATAN) 0.005 % ophthalmic solution Place 1 drop into both eyes at bedtime.     Marland Kitchen levothyroxine (SYNTHROID) 50 MCG tablet TAKE 1 TABLET DAILY BEFORE BREAKFAST 90 tablet 3  . magnesium citrate SOLN Take 296 mLs (1 Bottle total) by mouth daily as needed for severe constipation. 195 mL 0  . metolazone (ZAROXOLYN) 5 MG tablet Take 1 tablet (5 mg total) by mouth daily as  needed (swelling). 90 tablet 3  . metoprolol succinate (TOPROL-XL) 50 MG 24 hr tablet TAKE 1 TABLET TWICE A DAY 180 tablet 0  . montelukast (SINGULAIR) 10 MG tablet TAKE 1 TABLET AT BEDTIME (Patient taking differently: Take 10 mg by mouth at bedtime. ) 90 tablet 4  . Multiple Vitamin (MULTIVITAMIN) capsule Take 1 capsule by mouth daily.    . nitroGLYCERIN (NITROSTAT) 0.4 MG SL tablet Place 0.4 mg under the tongue every 5 (five) minutes as needed for chest pain.     . Oxycodone HCl 10 MG TABS Take 1 tablet (10 mg total) by mouth every 6 (six) hours as needed for moderate pain or severe pain. 20 tablet 0  . polyethylene glycol (MIRALAX / GLYCOLAX) 17 g packet Take 17 g by mouth daily. 14 each 0  . potassium chloride SA (K-DUR,KLOR-CON) 20 MEQ tablet Take 4 tablets (80 meq) by mouth twice daily, take an extra 1 tablet (20 meq) on the days you take metolazone (Patient taking differently: Take 80 mEq by mouth 2 (two) times daily. Take 4 tablets (80 meq) by mouth twice daily at breakfast and at lunch, take an extra 1 tablet (20 meq) on the days you take metolazone put tablet in applesauce to aid swallowing) 732 tablet 3  . predniSONE (DELTASONE) 10 MG tablet TAKE 1 TABLET DAILY WITH BREAKFAST 90 tablet 3  . silver sulfADIAZINE (SILVADENE) 1 % cream Apply 1 application topically daily.    . SYMBICORT 160-4.5 MCG/ACT inhaler USE 2 INHALATIONS TWICE A DAY 30.6 g 0  . tamsulosin (FLOMAX) 0.4 MG CAPS capsule TAKE 1 CAPSULE DAILY 90 capsule 1  . torsemide (DEMADEX) 20 MG tablet Take 40 mg by mouth 2 (two) times daily.    . traMADol (ULTRAM) 50 MG tablet Take 1 tablet (50 mg total) by mouth 2 (two) times daily. 180 tablet 0  . traZODone (DESYREL) 50 MG tablet Take 2 tablets (100 mg total) by mouth at bedtime. 180 tablet 0  . vitamin C (ASCORBIC ACID) 250 MG tablet Take 250 mg by mouth 2 (two) times daily.     No current facility-administered medications on file prior to visit.     There are no Patient  Instructions on file for this visit. No follow-ups on file.   Kris Hartmann, NP  This note was completed with Sales executive.  Any errors are purely unintentional.

## 2019-01-16 LAB — AEROBIC CULTURE

## 2019-01-17 ENCOUNTER — Other Ambulatory Visit (INDEPENDENT_AMBULATORY_CARE_PROVIDER_SITE_OTHER): Payer: Self-pay | Admitting: Nurse Practitioner

## 2019-01-17 DIAGNOSIS — A498 Other bacterial infections of unspecified site: Secondary | ICD-10-CM

## 2019-01-17 DIAGNOSIS — I7025 Atherosclerosis of native arteries of other extremities with ulceration: Secondary | ICD-10-CM

## 2019-01-17 MED ORDER — CIPROFLOXACIN HCL 750 MG PO TABS: 750.0000 mg | ORAL_TABLET | Freq: Two times a day (BID) | ORAL | 0 refills | Status: DC

## 2019-01-17 NOTE — Progress Notes (Signed)
I have called in some ciprofloxacin to his pharmacy.  He should stop the Keflex and begin taking the Cipro instead

## 2019-01-18 ENCOUNTER — Other Ambulatory Visit: Payer: Self-pay | Admitting: Cardiovascular Disease

## 2019-01-19 ENCOUNTER — Other Ambulatory Visit: Payer: Self-pay | Admitting: Pulmonary Disease

## 2019-01-19 NOTE — Telephone Encounter (Signed)
Please review refill for Potassium.  There are two different instructions on the medication list.  It is unclear to me what Potassium dose the patient needs.

## 2019-01-22 ENCOUNTER — Ambulatory Visit (INDEPENDENT_AMBULATORY_CARE_PROVIDER_SITE_OTHER): Payer: Medicare Other | Admitting: Internal Medicine

## 2019-01-22 ENCOUNTER — Other Ambulatory Visit: Payer: Medicare Other

## 2019-01-22 ENCOUNTER — Encounter: Payer: Self-pay | Admitting: Internal Medicine

## 2019-01-22 VITALS — Temp 99.0°F

## 2019-01-22 DIAGNOSIS — R339 Retention of urine, unspecified: Secondary | ICD-10-CM

## 2019-01-22 DIAGNOSIS — L089 Local infection of the skin and subcutaneous tissue, unspecified: Secondary | ICD-10-CM

## 2019-01-22 DIAGNOSIS — R509 Fever, unspecified: Secondary | ICD-10-CM | POA: Diagnosis not present

## 2019-01-22 LAB — POC URINALSYSI DIPSTICK (AUTOMATED)
Bilirubin, UA: NEGATIVE
Glucose, UA: NEGATIVE
Ketones, UA: NEGATIVE
Nitrite, UA: NEGATIVE
Protein, UA: NEGATIVE
Spec Grav, UA: 1.02 (ref 1.010–1.025)
Urobilinogen, UA: 0.2 E.U./dL
pH, UA: 5.5 (ref 5.0–8.0)

## 2019-01-22 NOTE — Patient Instructions (Signed)
Wound Care, Adult Taking care of your wound properly can help to prevent pain, infection, and scarring. It can also help your wound to heal more quickly. How to care for your wound Wound care      Follow instructions from your health care provider about how to take care of your wound. Make sure you: ? Wash your hands with soap and water before you change the bandage (dressing). If soap and water are not available, use hand sanitizer. ? Change your dressing as told by your health care provider. ? Leave stitches (sutures), skin glue, or adhesive strips in place. These skin closures may need to stay in place for 2 weeks or longer. If adhesive strip edges start to loosen and curl up, you may trim the loose edges. Do not remove adhesive strips completely unless your health care provider tells you to do that.  Check your wound area every day for signs of infection. Check for: ? Redness, swelling, or pain. ? Fluid or blood. ? Warmth. ? Pus or a bad smell.  Ask your health care provider if you should clean the wound with mild soap and water. Doing this may include: ? Using a clean towel to pat the wound dry after cleaning it. Do not rub or scrub the wound. ? Applying a cream or ointment. Do this only as told by your health care provider. ? Covering the incision with a clean dressing.  Ask your health care provider when you can leave the wound uncovered.  Keep the dressing dry until your health care provider says it can be removed. Do not take baths, swim, use a hot tub, or do anything that would put the wound underwater until your health care provider approves. Ask your health care provider if you can take showers. You may only be allowed to take sponge baths. Medicines   If you were prescribed an antibiotic medicine, cream, or ointment, take or use the antibiotic as told by your health care provider. Do not stop taking or using the antibiotic even if your condition improves.  Take  over-the-counter and prescription medicines only as told by your health care provider. If you were prescribed pain medicine, take it 30 or more minutes before you do any wound care or as told by your health care provider. General instructions  Return to your normal activities as told by your health care provider. Ask your health care provider what activities are safe.  Do not scratch or pick at the wound.  Do not use any products that contain nicotine or tobacco, such as cigarettes and e-cigarettes. These may delay wound healing. If you need help quitting, ask your health care provider.  Keep all follow-up visits as told by your health care provider. This is important.  Eat a diet that includes protein, vitamin A, vitamin C, and other nutrient-rich foods to help the wound heal. ? Foods rich in protein include meat, dairy, beans, nuts, and other sources. ? Foods rich in vitamin A include carrots and dark green, leafy vegetables. ? Foods rich in vitamin C include citrus, tomatoes, and other fruits and vegetables. ? Nutrient-rich foods have protein, carbohydrates, fat, vitamins, or minerals. Eat a variety of healthy foods including vegetables, fruits, and whole grains. Contact a health care provider if:  You received a tetanus shot and you have swelling, severe pain, redness, or bleeding at the injection site.  Your pain is not controlled with medicine.  You have redness, swelling, or pain around the wound.    You have fluid or blood coming from the wound.  Your wound feels warm to the touch.  You have pus or a bad smell coming from the wound.  You have a fever or chills.  You are nauseous or you vomit.  You are dizzy. Get help right away if:  You have a red streak going away from your wound.  The edges of the wound open up and separate.  Your wound is bleeding, and the bleeding does not stop with gentle pressure.  You have a rash.  You faint.  You have trouble breathing.  Summary  Always wash your hands with soap and water before changing your bandage (dressing).  To help with healing, eat foods that are rich in protein, vitamin A, vitamin C, and other nutrients.  Check your wound every day for signs of infection. Contact your health care provider if you suspect that your wound is infected. This information is not intended to replace advice given to you by your health care provider. Make sure you discuss any questions you have with your health care provider. Document Released: 01/06/2008 Document Revised: 07/17/2018 Document Reviewed: 10/14/2015 Elsevier Patient Education  2020 [employer]  

## 2019-01-22 NOTE — Progress Notes (Signed)
Virtual Visit via Video Note  I connected with [name]. on 01/22/19 at 11:30 AM EDT by a video enabled telemedicine application and verified that I am speaking with the correct person using two identifiers.  Location: Patient: Home Provider: Office   I discussed the limitations of evaluation and management by telemedicine and the availability of in person appointments. The patient expressed understanding and agreed to proceed.  History of Present Illness:  Pt reports fever and difficulty urinating. He reports this started yesterday. He reports fever up to 101, denies chills or body aches. He is not urinating as often as he normally does despite being on his Lasix. He denies urgency, frequency, dysuria or blood in his urine. He denies nasal congestion, ear pain, sore throat, loss of taste or smell. He does have a slight runny nose, cough and shortness of breath (chronic). He denies nausea, vomiting, abdominal pain or low back pain. He was recently started on Keflex, changed to Cipro for diabetic wound infection of the lower extremities. He has not had sick contacts that he is aware of. He does not feel bad.    Past Medical History:  Diagnosis Date  . Arthritis   . CAD    a. MI 01/29/1996 tx'd w/ TPA @ [facility]; b. Myoview 06/2005: EF 50%, scar @ apex, mild peri-infarct ischemia  . Cancer ([place])    skin  . Chronic atrial fibrillation ([place])    a. since 2006; b. on warfarin  . Chronic diastolic CHF (congestive heart failure) ([place])    a. echo 04/2006: EF lower limits of nl, mod LVH, mild aortic root dilatation, & mild MR, biatrial enlargement; b. echo 04/2013: EF 60%, mod dilated LA, mild MR & TR, mod pulm HTN w/ RV systolic pressure 53, c. echo 04/21/14: EF 55-60%, unable to exclude WMA, severely dilated LA 6.6 cm, nl RVSP, mildly dilated aortic root  . COPD (chronic obstructive pulmonary disease) ([facility])    oxygen prn at home  . CVA 7510,2585   x2  . DM   . Falls   . GERD  (gastroesophageal reflux disease)   . History of kidney stones   . HYPERLIPIDEMIA   . HYPERTENSION   . Kidney stone    a. s/p left ureteral stenting 04/24/14  . Left arm weakness    limited movement. S/P fall injury  . Neuropathy of both feet   . Poor balance   . Wears dentures    full upper and lower    Current Outpatient Medications  Medication Sig Dispense Refill  . acetaminophen (TYLENOL) 650 MG CR tablet Take 650 mg by mouth every 8 (eight) hours as needed for pain.     Marland Kitchen albuterol (PROAIR HFA) 108 (90 Base) MCG/ACT inhaler USE 1 TO 2 INHALATIONS EVERY 4 HOURS AS NEEDED (Patient taking differently: Inhale 1-2 puffs into the lungs every 4 (four) hours as needed for wheezing or shortness of breath. ) 25.5 g 3  . ALPRAZolam (XANAX) 0.25 MG tablet Take 1 tablet (0.25 mg total) by mouth at bedtime as needed for anxiety. 90 tablet 0  . Amino Acids-Protein Hydrolys (FEEDING SUPPLEMENT, PRO-STAT SUGAR FREE 64,) LIQD Take 30 mLs by mouth 3 (three) times daily with meals.    Marland Kitchen atorvastatin (LIPITOR) 40 MG tablet TAKE 1 TABLET DAILY 90 tablet 0  . cephALEXin (KEFLEX) 500 MG capsule Take 1 capsule (500 mg total) by mouth 2 (two) times daily. 20 capsule 0  . ciprofloxacin (CIPRO) 750 MG tablet Take  1 tablet (750 mg total) by mouth 2 (two) times daily. 20 tablet 0  . citalopram (CELEXA) 10 MG tablet TAKE 1 TABLET DAILY 90 tablet 3  . COLCRYS 0.6 MG tablet TAKE 1 TABLET TWICE A DAY AS NEEDED (ACUTE GOUT FLARE) USE AS DIRECTED (Patient taking differently: Take 0.6 mg by mouth 2 (two) times daily as needed (acute gout flare). ) 90 tablet 7  . collagenase (SANTYL) ointment Apply 1 application topically daily. 30 g 1  . COMBIGAN 0.2-0.5 % ophthalmic solution Place 1 drop into both eyes 2 (two) times a day.     . diclofenac sodium (VOLTAREN) 1 % GEL Apply 4 g topically 4 (four) times daily. 5 Tube 5  . docusate sodium (COLACE) 100 MG capsule Take 1 capsule (100 mg total) by mouth 2 (two) times daily. 10  capsule 0  . dorzolamide (TRUSOPT) 2 % ophthalmic solution Place 1 drop into both eyes 3 (three) times daily.     Marland Kitchen ELIQUIS 5 MG TABS tablet TAKE 1 TABLET TWICE A DAY (Patient taking differently: Take 5 mg by mouth 2 (two) times daily. ) 180 tablet 1  . ezetimibe (ZETIA) 10 MG tablet TAKE 1 TABLET DAILY 90 tablet 0  . finasteride (PROSCAR) 5 MG tablet TAKE 1 TABLET DAILY 90 tablet 0  . gabapentin (NEURONTIN) 100 MG capsule TAKE 1 CAPSULE THREE TIMES A DAY 270 capsule 0  . glipiZIDE (GLUCOTROL) 5 MG tablet Take 5 mg by mouth 2 (two) times daily before a meal.    . INCRUSE ELLIPTA 62.5 MCG/INH AEPB INHALE 1 PUFF DAILY 90 each 3  . insulin detemir (LEVEMIR) 100 UNIT/ML injection Inject 0.28 mLs (28 Units total) into the skin daily at 10 pm. (Patient taking differently: Inject 42 Units into the skin daily at 10 pm. ) 10 mL 11  . insulin lispro (HUMALOG) 100 UNIT/ML injection Inject 0.04 mLs (4 Units total) into the skin 3 (three) times daily with meals. (Patient taking differently: Inject 10-18 Units into the skin 3 (three) times daily with meals. 18 am, 20 lunch, 16 supper) 10 mL 11  . Insulin Pen Needle (BD PEN NEEDLE NANO U/F) 32G X 4 MM MISC USE THREE TIMES A DAY FOR INSULIN ADMINISTRATION 270 each 4  . Insulin Syringe-Needle U-100 30G X 1/2" 1 ML MISC 1 each by Does not apply route 3 (three) times daily. 200 each 5  . latanoprost (XALATAN) 0.005 % ophthalmic solution Place 1 drop into both eyes at bedtime.     Marland Kitchen levothyroxine (SYNTHROID) 50 MCG tablet TAKE 1 TABLET DAILY BEFORE BREAKFAST 90 tablet 3  . magnesium citrate SOLN Take 296 mLs (1 Bottle total) by mouth daily as needed for severe constipation. 195 mL 0  . metolazone (ZAROXOLYN) 5 MG tablet Take 1 tablet (5 mg total) by mouth daily as needed (swelling). 90 tablet 3  . metoprolol succinate (TOPROL-XL) 50 MG 24 hr tablet TAKE 1 TABLET TWICE A DAY 180 tablet 0  . montelukast (SINGULAIR) 10 MG tablet TAKE 1 TABLET AT BEDTIME (Patient taking  differently: Take 10 mg by mouth at bedtime. ) 90 tablet 4  . Multiple Vitamin (MULTIVITAMIN) capsule Take 1 capsule by mouth daily.    . nitroGLYCERIN (NITROSTAT) 0.4 MG SL tablet Place 0.4 mg under the tongue every 5 (five) minutes as needed for chest pain.     . Oxycodone HCl 10 MG TABS Take 1 tablet (10 mg total) by mouth every 6 (six) hours as needed for moderate  pain or severe pain. 20 tablet 0  . polyethylene glycol (MIRALAX / GLYCOLAX) 17 g packet Take 17 g by mouth daily. 14 each 0  . potassium chloride SA (K-DUR,KLOR-CON) 20 MEQ tablet Take 4 tablets (80 meq) by mouth twice daily, take an extra 1 tablet (20 meq) on the days you take metolazone (Patient taking differently: Take 80 mEq by mouth 2 (two) times daily. Take 4 tablets (80 meq) by mouth twice daily at breakfast and at lunch, take an extra 1 tablet (20 meq) on the days you take metolazone put tablet in applesauce to aid swallowing) 732 tablet 3  . predniSONE (DELTASONE) 10 MG tablet TAKE 1 TABLET DAILY WITH BREAKFAST 90 tablet 3  . silver sulfADIAZINE (SILVADENE) 1 % cream Apply 1 application topically daily.    . SYMBICORT 160-4.5 MCG/ACT inhaler USE 2 INHALATIONS TWICE A DAY 30.6 g 0  . tamsulosin (FLOMAX) 0.4 MG CAPS capsule TAKE 1 CAPSULE DAILY 90 capsule 1  . torsemide (DEMADEX) 20 MG tablet Take 40 mg by mouth 2 (two) times daily.    . traMADol (ULTRAM) 50 MG tablet Take 1 tablet (50 mg total) by mouth 2 (two) times daily. 180 tablet 0  . traZODone (DESYREL) 50 MG tablet Take 2 tablets (100 mg total) by mouth at bedtime. 180 tablet 0  . vitamin C (ASCORBIC ACID) 250 MG tablet Take 250 mg by mouth 2 (two) times daily.     No current facility-administered medications for this visit.     Allergies  Allergen Reactions  . Contrast Media [Iodinated Diagnostic Agents] Shortness Of Breath  . Morphine And Related Other (See Comments)    Hallucinations   . Niacin And Related Dermatitis  . Other     Other reaction(s): SHORTNESS  OF BREATH  . Amlodipine Rash    Family History  Problem Relation Age of Onset  . Heart disease Mother   . Diabetes Mother   . Heart disease Maternal Grandmother   . Diabetes Maternal Grandmother   . Cancer Neg Hx   . Stroke Neg Hx     Social History   Socioeconomic History  . Marital status: Married    Spouse name: Not on file  . Number of children: Not on file  . Years of education: Not on file  . Highest education level: Not on file  Occupational History  . Not on file  Social Needs  . Financial resource strain: Not very hard  . Food insecurity    Worry: Never true    Inability: Never true  . Transportation needs    Medical: No    Non-medical: No  Tobacco Use  . Smoking status: Former Smoker    Packs/day: 2.00    Years: 40.00    Pack years: 80.00    Types: Cigarettes    Quit date: 05/24/1990    Years since quitting: 28.6  . Smokeless tobacco: Former Systems developer    Quit date: 05/24/1990  Substance and Sexual Activity  . Alcohol use: No  . Drug use: No  . Sexual activity: Not Currently  Lifestyle  . Physical activity    Days per week: Patient refused    Minutes per session: Patient refused  . Stress: Not at all  Relationships  . Social Herbalist on phone: Patient refused    Gets together: Patient refused    Attends religious service: Patient refused    Active member of club or organization: Patient refused    Attends  meetings of clubs or organizations: Patient refused    Relationship status: Patient refused  . Intimate partner violence    Fear of current or ex partner: Patient refused    Emotionally abused: Patient refused    Physically abused: Patient refused    Forced sexual activity: Patient refused  Other Topics Concern  . Not on file  Social History Narrative  . Not on file     Constitutional: Pt reports fever. Denies fever, malaise, fatigue, headache or abrupt weight changes.  HEENT: Pt reports runny nose. Denies eye pain, eye redness,  ear pain, ringing in the ears, wax buildup, r nasal congestion, bloody nose, or sore throat. Respiratory: Pt reports cough, shortness of breath. Denies difficulty breathing.   Gastrointestinal: Denies abdominal pain, bloating, constipation, diarrhea or blood in the stool.  GU: Pt reports difficulty urinating. Denies urgency, frequency, pain with urination, burning sensation, blood in urine, odor or discharge.  No other specific complaints in a complete review of systems (except as listed in HPI above).  Observations/Objective: Temp 99 F (37.2 C) (Temporal)  Wt Readings from Last 3 Encounters:  12/26/18 172 lb (78 kg)  11/14/18 172 lb (78 kg)  10/23/18 182 lb (82.6 kg)    General: Voice sounds weak. Sounds mildly SOB. BMET    Component Value Date/Time   NA 140 10/10/2018 1826   NA 143 02/21/2015 1108   NA 137 08/05/2014 1839   K 4.3 10/10/2018 1826   K 3.6 08/05/2014 1839   CL 101 10/10/2018 1826   CL 106 08/05/2014 1839   CO2 28 10/10/2018 1826   CO2 23 08/05/2014 1839   GLUCOSE 226 (H) 10/10/2018 1826   GLUCOSE 235 (H) 08/05/2014 1839   BUN 36 (H) 10/10/2018 1826   BUN 26 02/21/2015 1108   BUN 19 08/05/2014 1839   CREATININE 1.27 (H) 10/13/2018 0423   CREATININE 1.20 08/05/2014 1839   CALCIUM 9.6 10/10/2018 1826   CALCIUM 8.6 (L) 08/05/2014 1839   GFRNONAA 52 (L) 10/13/2018 0423   GFRNONAA 57 (L) 08/05/2014 1839   GFRAA 60 (L) 10/13/2018 0423   GFRAA >60 08/05/2014 1839    Lipid Panel     Component Value Date/Time   CHOL 122 02/16/2018 1126   TRIG 189.0 (H) 02/16/2018 1126   HDL 47.50 02/16/2018 1126   CHOLHDL 3 02/16/2018 1126   VLDL 37.8 02/16/2018 1126   LDLCALC 37 02/16/2018 1126    CBC    Component Value Date/Time   WBC 17.1 (H) 10/12/2018 0333   RBC 3.97 (L) 10/12/2018 0333   HGB 11.4 (L) 10/12/2018 0333   HGB 13.3 08/05/2014 1839   HCT 37.5 (L) 10/12/2018 0333   HCT 40.1 08/05/2014 1839   PLT 323 10/12/2018 0333   PLT 246 08/05/2014 1839    MCV 94.5 10/12/2018 0333   MCV 83 08/05/2014 1839   MCH 28.7 10/12/2018 0333   MCHC 30.4 10/12/2018 0333   RDW 17.2 (H) 10/12/2018 0333   RDW 16.3 (H) 08/05/2014 1839   LYMPHSABS 2.8 10/12/2018 0333   LYMPHSABS 2.3 08/05/2014 1839   MONOABS 2.1 (H) 10/12/2018 0333   MONOABS 2.0 (H) 08/05/2014 1839   EOSABS 0.0 10/12/2018 0333   EOSABS 0.1 08/05/2014 1839   BASOSABS 0.1 10/12/2018 0333   BASOSABS 0.1 08/05/2014 1839    Hgb A1C Lab Results  Component Value Date   HGBA1C 7.4 (H) 09/11/2018        Assessment and Plan:  Fever, Urinary Retention, Wound Infection:  Urinalysis and culture Push fluids Continue Tylenol for fever Continue Cipro for wound infection- have nurse check this and may sure this isn't the root cause of his fevers Discussed ER precautions if worse He refuses COVID testing at this time  Follow Up Instructions:    I discussed the assessment and treatment plan with the patient. The patient was provided an opportunity to ask questions and all were answered. The patient agreed with the plan and demonstrated an understanding of the instructions.   The patient was advised to call back or seek an in-person evaluation if the symptoms worsen or if the condition fails to improve as anticipated.     Webb Silversmith, NP

## 2019-01-23 ENCOUNTER — Telehealth: Payer: Self-pay

## 2019-01-23 NOTE — Telephone Encounter (Signed)
Lilia Pro nurse with [facility] said pt has had low grade fever since 01/20/19. Today temp 100.9; wound appears the same with faint odor. Pt is presently on ciprofloxacin 750 mg taking 1 bid for 10 days starting on 01/17/19. Lilia Pro said today urine appears clear with no odor and Lilia Pro said niece brought urine to [facility] on 01/22/19. Pt has prod cough that usually has and is no worse than normal. Pt does have scattered wheezing thru all lobes and pt had neb treatment right before Morgan's arrival. Lilia Pro is concerned and does pt need to be seen. Per instructions from virtual visit on 01/22/19 if pt condition worsens pt should go to ED for eval. Lilia Pro agrees and will let pt know should go to ED. Lilia Pro said something is off but cannot put her finger on it. FYI to Avie Echevaria NP.

## 2019-01-23 NOTE — Telephone Encounter (Signed)
I am concerned he could be getting septic possibly from his wound. Would recommend ER eval.

## 2019-01-24 LAB — URINE CULTURE
MICRO NUMBER:: 979267
Result:: NO GROWTH
SPECIMEN QUALITY:: ADEQUATE

## 2019-01-24 NOTE — Telephone Encounter (Signed)
Spoke to [name], he did not want to go to ED, [facility] did advise him to do so... [name] reports his temperature was 98 today and he feels fine.... [name] states the [facility] nurse comes tomorrow and will let her know we asked to call us with an update... [name] is aware of risks of not seeking emergency care

## 2019-01-25 ENCOUNTER — Encounter: Payer: Self-pay | Admitting: Emergency Medicine

## 2019-01-25 ENCOUNTER — Emergency Department: Payer: Medicare Other

## 2019-01-25 ENCOUNTER — Other Ambulatory Visit: Payer: Self-pay

## 2019-01-25 ENCOUNTER — Inpatient Hospital Stay
Admission: EM | Admit: 2019-01-25 | Discharge: 2019-01-27 | DRG: 871 | Disposition: A | Discharge status: Other Acute Inpatient Hospital | Payer: Medicare Other | Attending: Internal Medicine | Admitting: Internal Medicine

## 2019-01-25 DIAGNOSIS — J1289 Other viral pneumonia: Secondary | ICD-10-CM | POA: Diagnosis present

## 2019-01-25 DIAGNOSIS — R296 Repeated falls: Secondary | ICD-10-CM | POA: Diagnosis present

## 2019-01-25 DIAGNOSIS — N183 Chronic kidney disease, stage 3 unspecified: Secondary | ICD-10-CM | POA: Diagnosis present

## 2019-01-25 DIAGNOSIS — Z7952 Long term (current) use of systemic steroids: Secondary | ICD-10-CM

## 2019-01-25 DIAGNOSIS — F419 Anxiety disorder, unspecified: Secondary | ICD-10-CM | POA: Diagnosis present

## 2019-01-25 DIAGNOSIS — Z794 Long term (current) use of insulin: Secondary | ICD-10-CM

## 2019-01-25 DIAGNOSIS — Z23 Encounter for immunization: Secondary | ICD-10-CM

## 2019-01-25 DIAGNOSIS — J9621 Acute and chronic respiratory failure with hypoxia: Secondary | ICD-10-CM | POA: Diagnosis present

## 2019-01-25 DIAGNOSIS — Z8249 Family history of ischemic heart disease and other diseases of the circulatory system: Secondary | ICD-10-CM

## 2019-01-25 DIAGNOSIS — U071 COVID-19: Secondary | ICD-10-CM | POA: Diagnosis present

## 2019-01-25 DIAGNOSIS — Z960 Presence of urogenital implants: Secondary | ICD-10-CM | POA: Diagnosis present

## 2019-01-25 DIAGNOSIS — G4733 Obstructive sleep apnea (adult) (pediatric): Secondary | ICD-10-CM | POA: Diagnosis present

## 2019-01-25 DIAGNOSIS — Z95828 Presence of other vascular implants and grafts: Secondary | ICD-10-CM

## 2019-01-25 DIAGNOSIS — J188 Other pneumonia, unspecified organism: Secondary | ICD-10-CM | POA: Diagnosis present

## 2019-01-25 DIAGNOSIS — E039 Hypothyroidism, unspecified: Secondary | ICD-10-CM | POA: Diagnosis present

## 2019-01-25 DIAGNOSIS — I130 Hypertensive heart and chronic kidney disease with heart failure and stage 1 through stage 4 chronic kidney disease, or unspecified chronic kidney disease: Secondary | ICD-10-CM | POA: Diagnosis present

## 2019-01-25 DIAGNOSIS — N400 Enlarged prostate without lower urinary tract symptoms: Secondary | ICD-10-CM | POA: Diagnosis present

## 2019-01-25 DIAGNOSIS — J440 Chronic obstructive pulmonary disease with acute lower respiratory infection: Secondary | ICD-10-CM | POA: Diagnosis present

## 2019-01-25 DIAGNOSIS — I2510 Atherosclerotic heart disease of native coronary artery without angina pectoris: Secondary | ICD-10-CM | POA: Diagnosis present

## 2019-01-25 DIAGNOSIS — J189 Pneumonia, unspecified organism: Secondary | ICD-10-CM | POA: Diagnosis present

## 2019-01-25 DIAGNOSIS — T8744 Infection of amputation stump, left lower extremity: Secondary | ICD-10-CM | POA: Diagnosis present

## 2019-01-25 DIAGNOSIS — J441 Chronic obstructive pulmonary disease with (acute) exacerbation: Secondary | ICD-10-CM | POA: Diagnosis present

## 2019-01-25 DIAGNOSIS — L97419 Non-pressure chronic ulcer of right heel and midfoot with unspecified severity: Secondary | ICD-10-CM | POA: Diagnosis present

## 2019-01-25 DIAGNOSIS — A4189 Other specified sepsis: Secondary | ICD-10-CM | POA: Diagnosis present

## 2019-01-25 DIAGNOSIS — E872 Acidosis, unspecified: Secondary | ICD-10-CM

## 2019-01-25 DIAGNOSIS — E11621 Type 2 diabetes mellitus with foot ulcer: Secondary | ICD-10-CM | POA: Diagnosis present

## 2019-01-25 DIAGNOSIS — Z85828 Personal history of other malignant neoplasm of skin: Secondary | ICD-10-CM

## 2019-01-25 DIAGNOSIS — I252 Old myocardial infarction: Secondary | ICD-10-CM

## 2019-01-25 DIAGNOSIS — Z91041 Radiographic dye allergy status: Secondary | ICD-10-CM

## 2019-01-25 DIAGNOSIS — E785 Hyperlipidemia, unspecified: Secondary | ICD-10-CM | POA: Diagnosis present

## 2019-01-25 DIAGNOSIS — Z87442 Personal history of urinary calculi: Secondary | ICD-10-CM

## 2019-01-25 DIAGNOSIS — N179 Acute kidney failure, unspecified: Secondary | ICD-10-CM | POA: Diagnosis present

## 2019-01-25 DIAGNOSIS — Z79890 Hormone replacement therapy (postmenopausal): Secondary | ICD-10-CM

## 2019-01-25 DIAGNOSIS — Z833 Family history of diabetes mellitus: Secondary | ICD-10-CM

## 2019-01-25 DIAGNOSIS — Z9981 Dependence on supplemental oxygen: Secondary | ICD-10-CM

## 2019-01-25 DIAGNOSIS — Z7951 Long term (current) use of inhaled steroids: Secondary | ICD-10-CM

## 2019-01-25 DIAGNOSIS — Z955 Presence of coronary angioplasty implant and graft: Secondary | ICD-10-CM

## 2019-01-25 DIAGNOSIS — Z79891 Long term (current) use of opiate analgesic: Secondary | ICD-10-CM

## 2019-01-25 DIAGNOSIS — Z885 Allergy status to narcotic agent status: Secondary | ICD-10-CM

## 2019-01-25 DIAGNOSIS — E1122 Type 2 diabetes mellitus with diabetic chronic kidney disease: Secondary | ICD-10-CM | POA: Diagnosis present

## 2019-01-25 DIAGNOSIS — I272 Pulmonary hypertension, unspecified: Secondary | ICD-10-CM | POA: Diagnosis present

## 2019-01-25 DIAGNOSIS — I5032 Chronic diastolic (congestive) heart failure: Secondary | ICD-10-CM | POA: Diagnosis present

## 2019-01-25 DIAGNOSIS — K219 Gastro-esophageal reflux disease without esophagitis: Secondary | ICD-10-CM | POA: Diagnosis present

## 2019-01-25 DIAGNOSIS — E1151 Type 2 diabetes mellitus with diabetic peripheral angiopathy without gangrene: Secondary | ICD-10-CM | POA: Diagnosis present

## 2019-01-25 DIAGNOSIS — Z8673 Personal history of transient ischemic attack (TIA), and cerebral infarction without residual deficits: Secondary | ICD-10-CM

## 2019-01-25 DIAGNOSIS — E1140 Type 2 diabetes mellitus with diabetic neuropathy, unspecified: Secondary | ICD-10-CM | POA: Diagnosis present

## 2019-01-25 DIAGNOSIS — Z79899 Other long term (current) drug therapy: Secondary | ICD-10-CM

## 2019-01-25 DIAGNOSIS — Z87891 Personal history of nicotine dependence: Secondary | ICD-10-CM

## 2019-01-25 DIAGNOSIS — Z888 Allergy status to other drugs, medicaments and biological substances status: Secondary | ICD-10-CM

## 2019-01-25 DIAGNOSIS — I482 Chronic atrial fibrillation, unspecified: Secondary | ICD-10-CM | POA: Diagnosis present

## 2019-01-25 DIAGNOSIS — Z7901 Long term (current) use of anticoagulants: Secondary | ICD-10-CM

## 2019-01-25 LAB — CBC WITH DIFFERENTIAL/PLATELET
Abs Immature Granulocytes: 0.03 10*3/uL (ref 0.00–0.07)
Basophils Absolute: 0 10*3/uL (ref 0.0–0.1)
Basophils Relative: 0 %
Eosinophils Absolute: 0 10*3/uL (ref 0.0–0.5)
Eosinophils Relative: 0 %
HCT: 41.6 % (ref 39.0–52.0)
Hemoglobin: 13.6 g/dL (ref 13.0–17.0)
Immature Granulocytes: 0 %
Lymphocytes Relative: 24 %
Lymphs Abs: 2.1 10*3/uL (ref 0.7–4.0)
MCH: 28.5 pg (ref 26.0–34.0)
MCHC: 32.7 g/dL (ref 30.0–36.0)
MCV: 87.2 fL (ref 80.0–100.0)
Monocytes Absolute: 0.8 10*3/uL (ref 0.1–1.0)
Monocytes Relative: 9 %
Neutro Abs: 6.1 10*3/uL (ref 1.7–7.7)
Neutrophils Relative %: 67 %
Platelets: 189 10*3/uL (ref 150–400)
RBC: 4.77 MIL/uL (ref 4.22–5.81)
RDW: 16.1 % — ABNORMAL HIGH (ref 11.5–15.5)
WBC: 9.1 10*3/uL (ref 4.0–10.5)
nRBC: 0 % (ref 0.0–0.2)

## 2019-01-25 LAB — BLOOD GAS, VENOUS
Acid-Base Excess: 6.6 mmol/L — ABNORMAL HIGH (ref 0.0–2.0)
Bicarbonate: 32.4 mmol/L — ABNORMAL HIGH (ref 20.0–28.0)
O2 Saturation: 76.1 %
Patient temperature: 37
pCO2, Ven: 50 mmHg (ref 44.0–60.0)
pH, Ven: 7.42 (ref 7.250–7.430)
pO2, Ven: 40 mmHg (ref 32.0–45.0)

## 2019-01-25 LAB — COMPREHENSIVE METABOLIC PANEL
ALT: 17 U/L (ref 0–44)
AST: 26 U/L (ref 15–41)
Albumin: 3 g/dL — ABNORMAL LOW (ref 3.5–5.0)
Alkaline Phosphatase: 63 U/L (ref 38–126)
Anion gap: 14 (ref 5–15)
BUN: 44 mg/dL — ABNORMAL HIGH (ref 8–23)
CO2: 31 mmol/L (ref 22–32)
Calcium: 8.5 mg/dL — ABNORMAL LOW (ref 8.9–10.3)
Chloride: 94 mmol/L — ABNORMAL LOW (ref 98–111)
Creatinine, Ser: 1.8 mg/dL — ABNORMAL HIGH (ref 0.61–1.24)
GFR calc Af Amer: 39 mL/min — ABNORMAL LOW (ref >60)
GFR calc non Af Amer: 34 mL/min — ABNORMAL LOW (ref >60)
Glucose, Bld: 166 mg/dL — ABNORMAL HIGH (ref 70–99)
Potassium: 3.4 mmol/L — ABNORMAL LOW (ref 3.5–5.1)
Sodium: 139 mmol/L (ref 135–145)
Total Bilirubin: 0.6 mg/dL (ref 0.3–1.2)
Total Protein: 6.6 g/dL (ref 6.5–8.1)

## 2019-01-25 LAB — URINALYSIS, COMPLETE (UACMP) WITH MICROSCOPIC
Bacteria, UA: NONE SEEN
Bilirubin Urine: NEGATIVE
Glucose, UA: NEGATIVE mg/dL
Ketones, ur: NEGATIVE mg/dL
Leukocytes,Ua: NEGATIVE
Nitrite: NEGATIVE
Protein, ur: NEGATIVE mg/dL
Specific Gravity, Urine: 1.008 (ref 1.005–1.030)
Squamous Epithelial / HPF: NONE SEEN (ref 0–5)
pH: 5 (ref 5.0–8.0)

## 2019-01-25 LAB — LACTIC ACID, PLASMA
Lactic Acid, Venous: 3.1 mmol/L (ref 0.5–1.9)
Lactic Acid, Venous: 2.6 mmol/L (ref 0.5–1.9)

## 2019-01-25 LAB — BRAIN NATRIURETIC PEPTIDE: B Natriuretic Peptide: 288 pg/mL — ABNORMAL HIGH (ref 0.0–100.0)

## 2019-01-25 LAB — GLUCOSE, CAPILLARY
Glucose-Capillary: 230 mg/dL — ABNORMAL HIGH (ref 70–99)
Glucose-Capillary: 293 mg/dL — ABNORMAL HIGH (ref 70–99)

## 2019-01-25 MED ORDER — TAMSULOSIN HCL 0.4 MG PO CAPS
0.4000 mg | ORAL_CAPSULE | Freq: Every day | ORAL | Status: DC
  Administered 2019-01-25 – 2019-01-27 (×3): 0.4 mg via ORAL
  Filled 2019-01-25 (×3): qty 1

## 2019-01-25 MED ORDER — MAGNESIUM CITRATE PO SOLN
1.0000 | Freq: Every day | ORAL | Status: DC | PRN
  Filled 2019-01-25: qty 296

## 2019-01-25 MED ORDER — TRAZODONE HCL 100 MG PO TABS
100.0000 mg | ORAL_TABLET | Freq: Every day | ORAL | Status: DC
  Administered 2019-01-25 – 2019-01-26 (×2): 100 mg via ORAL
  Filled 2019-01-25 (×2): qty 1

## 2019-01-25 MED ORDER — ALPRAZOLAM 0.25 MG PO TABS: 0.2500 mg | ORAL_TABLET | Freq: Every evening | ORAL | Status: DC | PRN

## 2019-01-25 MED ORDER — ATORVASTATIN CALCIUM 20 MG PO TABS
40.0000 mg | ORAL_TABLET | Freq: Every day | ORAL | Status: DC
  Administered 2019-01-25 – 2019-01-27 (×3): 40 mg via ORAL
  Filled 2019-01-25 (×3): qty 2

## 2019-01-25 MED ORDER — FINASTERIDE 5 MG PO TABS
5.0000 mg | ORAL_TABLET | Freq: Every day | ORAL | Status: DC
  Administered 2019-01-26 – 2019-01-27 (×2): 5 mg via ORAL
  Filled 2019-01-25 (×2): qty 1

## 2019-01-25 MED ORDER — GABAPENTIN 100 MG PO CAPS
100.0000 mg | ORAL_CAPSULE | Freq: Three times a day (TID) | ORAL | Status: DC
  Administered 2019-01-25 – 2019-01-27 (×5): 100 mg via ORAL
  Filled 2019-01-25 (×5): qty 1

## 2019-01-25 MED ORDER — TRAMADOL HCL 50 MG PO TABS
50.0000 mg | ORAL_TABLET | Freq: Two times a day (BID) | ORAL | Status: DC
  Administered 2019-01-25 – 2019-01-27 (×4): 50 mg via ORAL
  Filled 2019-01-25 (×4): qty 1

## 2019-01-25 MED ORDER — MOMETASONE FURO-FORMOTEROL FUM 200-5 MCG/ACT IN AERO
2.0000 | INHALATION_SPRAY | Freq: Two times a day (BID) | RESPIRATORY_TRACT | Status: DC
  Administered 2019-01-25 – 2019-01-26 (×3): 2 via RESPIRATORY_TRACT
  Filled 2019-01-25: qty 8.8

## 2019-01-25 MED ORDER — VITAMIN C 500 MG PO TABS
250.0000 mg | ORAL_TABLET | Freq: Two times a day (BID) | ORAL | Status: DC
  Administered 2019-01-25 – 2019-01-26 (×3): 250 mg via ORAL
  Filled 2019-01-25 (×3): qty 1

## 2019-01-25 MED ORDER — SODIUM CHLORIDE 0.9 % IV SOLN
2.0000 g | Freq: Two times a day (BID) | INTRAVENOUS | Status: DC
  Administered 2019-01-25 – 2019-01-27 (×4): 2 g via INTRAVENOUS
  Filled 2019-01-25 (×5): qty 2

## 2019-01-25 MED ORDER — SODIUM CHLORIDE 0.9 % IV SOLN
INTRAVENOUS | Status: DC
  Administered 2019-01-25 – 2019-01-26 (×2): via INTRAVENOUS

## 2019-01-25 MED ORDER — PRO-STAT SUGAR FREE PO LIQD
30.0000 mL | Freq: Three times a day (TID) | ORAL | Status: DC
  Administered 2019-01-25 – 2019-01-27 (×5): 30 mL via ORAL

## 2019-01-25 MED ORDER — DICLOFENAC SODIUM 1 % TD GEL
4.0000 g | Freq: Four times a day (QID) | TRANSDERMAL | Status: DC | PRN
  Filled 2019-01-25: qty 100

## 2019-01-25 MED ORDER — ACETAMINOPHEN 325 MG PO TABS: 650.0000 mg | ORAL_TABLET | Freq: Four times a day (QID) | ORAL | Status: DC | PRN

## 2019-01-25 MED ORDER — NITROGLYCERIN 0.4 MG SL SUBL: 0.4000 mg | SUBLINGUAL_TABLET | SUBLINGUAL | Status: DC | PRN

## 2019-01-25 MED ORDER — METOPROLOL SUCCINATE ER 50 MG PO TB24
50.0000 mg | ORAL_TABLET | Freq: Two times a day (BID) | ORAL | Status: DC
  Administered 2019-01-25 – 2019-01-27 (×4): 50 mg via ORAL
  Filled 2019-01-25 (×4): qty 1

## 2019-01-25 MED ORDER — EZETIMIBE 10 MG PO TABS
10.0000 mg | ORAL_TABLET | Freq: Every day | ORAL | Status: DC
  Administered 2019-01-26 – 2019-01-27 (×2): 10 mg via ORAL
  Filled 2019-01-25 (×3): qty 1

## 2019-01-25 MED ORDER — INSULIN ASPART 100 UNIT/ML ~~LOC~~ SOLN
0.0000 [IU] | Freq: Three times a day (TID) | SUBCUTANEOUS | Status: DC
  Administered 2019-01-25 – 2019-01-26 (×2): 3 [IU] via SUBCUTANEOUS
  Administered 2019-01-26 (×2): 5 [IU] via SUBCUTANEOUS
  Filled 2019-01-25 (×3): qty 1

## 2019-01-25 MED ORDER — INSULIN DETEMIR 100 UNIT/ML ~~LOC~~ SOLN
28.0000 [IU] | Freq: Every day | SUBCUTANEOUS | Status: DC
  Administered 2019-01-25: 28 [IU] via SUBCUTANEOUS
  Filled 2019-01-25 (×2): qty 0.28

## 2019-01-25 MED ORDER — BRIMONIDINE TARTRATE 0.2 % OP SOLN
1.0000 [drp] | Freq: Two times a day (BID) | OPHTHALMIC | Status: DC
  Administered 2019-01-25 – 2019-01-26 (×3): 1 [drp] via OPHTHALMIC
  Filled 2019-01-25: qty 5

## 2019-01-25 MED ORDER — LATANOPROST 0.005 % OP SOLN
1.0000 [drp] | Freq: Every day | OPHTHALMIC | Status: DC
  Administered 2019-01-25 – 2019-01-26 (×2): 1 [drp] via OPHTHALMIC
  Filled 2019-01-25: qty 2.5

## 2019-01-25 MED ORDER — ADULT MULTIVITAMIN W/MINERALS CH
1.0000 | ORAL_TABLET | Freq: Every day | ORAL | Status: DC
  Administered 2019-01-26 – 2019-01-27 (×2): 1 via ORAL
  Filled 2019-01-25 (×2): qty 1

## 2019-01-25 MED ORDER — UMECLIDINIUM BROMIDE 62.5 MCG/INH IN AEPB
1.0000 | INHALATION_SPRAY | Freq: Every day | RESPIRATORY_TRACT | Status: DC
  Administered 2019-01-26: 1 via RESPIRATORY_TRACT
  Filled 2019-01-25: qty 7

## 2019-01-25 MED ORDER — INFLUENZA VAC A&B SA ADJ QUAD 0.5 ML IM PRSY
0.5000 mL | PREFILLED_SYRINGE | INTRAMUSCULAR | Status: DC
  Filled 2019-01-25 (×3): qty 0.5

## 2019-01-25 MED ORDER — SILVER SULFADIAZINE 1 % EX CREA
1.0000 "application " | TOPICAL_CREAM | Freq: Every day | CUTANEOUS | Status: DC | PRN
  Filled 2019-01-25: qty 85

## 2019-01-25 MED ORDER — PIPERACILLIN-TAZOBACTAM 3.375 G IVPB 30 MIN
3.3750 g | Freq: Once | INTRAVENOUS | Status: AC
  Administered 2019-01-25: 3.375 g via INTRAVENOUS
  Filled 2019-01-25: qty 50

## 2019-01-25 MED ORDER — POTASSIUM CHLORIDE CRYS ER 20 MEQ PO TBCR
20.0000 meq | EXTENDED_RELEASE_TABLET | Freq: Once | ORAL | Status: AC
  Administered 2019-01-25: 19:00:00 20 meq via ORAL
  Filled 2019-01-25: qty 1

## 2019-01-25 MED ORDER — INSULIN ASPART 100 UNIT/ML ~~LOC~~ SOLN
4.0000 [IU] | Freq: Three times a day (TID) | SUBCUTANEOUS | Status: DC
  Administered 2019-01-25 – 2019-01-26 (×2): 4 [IU] via SUBCUTANEOUS
  Filled 2019-01-25 (×2): qty 1

## 2019-01-25 MED ORDER — BRIMONIDINE TARTRATE-TIMOLOL 0.2-0.5 % OP SOLN
1.0000 [drp] | Freq: Two times a day (BID) | OPHTHALMIC | Status: DC
  Filled 2019-01-25 (×2): qty 5

## 2019-01-25 MED ORDER — COLCHICINE 0.6 MG PO TABS
0.6000 mg | ORAL_TABLET | Freq: Two times a day (BID) | ORAL | Status: DC | PRN
  Filled 2019-01-25: qty 1

## 2019-01-25 MED ORDER — VANCOMYCIN HCL IN DEXTROSE 1-5 GM/200ML-% IV SOLN
1000.0000 mg | Freq: Once | INTRAVENOUS | Status: AC
  Administered 2019-01-25: 23:00:00 1000 mg via INTRAVENOUS
  Filled 2019-01-25: qty 200

## 2019-01-25 MED ORDER — POLYETHYLENE GLYCOL 3350 17 G PO PACK
17.0000 g | PACK | Freq: Every day | ORAL | Status: DC
  Administered 2019-01-25 – 2019-01-27 (×3): 17 g via ORAL
  Filled 2019-01-25 (×3): qty 1

## 2019-01-25 MED ORDER — VANCOMYCIN VARIABLE DOSE PER UNSTABLE RENAL FUNCTION (PHARMACIST DOSING): Status: DC

## 2019-01-25 MED ORDER — VANCOMYCIN HCL IN DEXTROSE 1-5 GM/200ML-% IV SOLN
1000.0000 mg | Freq: Once | INTRAVENOUS | Status: AC
  Administered 2019-01-25: 1000 mg via INTRAVENOUS
  Filled 2019-01-25: qty 200

## 2019-01-25 MED ORDER — OXYCODONE HCL 5 MG PO TABS: 10.0000 mg | ORAL_TABLET | Freq: Four times a day (QID) | ORAL | Status: DC | PRN

## 2019-01-25 MED ORDER — MONTELUKAST SODIUM 10 MG PO TABS
10.0000 mg | ORAL_TABLET | Freq: Every day | ORAL | Status: DC
  Administered 2019-01-25 – 2019-01-26 (×2): 10 mg via ORAL
  Filled 2019-01-25 (×2): qty 1

## 2019-01-25 MED ORDER — ALBUTEROL SULFATE (2.5 MG/3ML) 0.083% IN NEBU: 2.5000 mg | INHALATION_SOLUTION | RESPIRATORY_TRACT | Status: DC | PRN

## 2019-01-25 MED ORDER — APIXABAN 2.5 MG PO TABS
2.5000 mg | ORAL_TABLET | Freq: Two times a day (BID) | ORAL | Status: DC
  Administered 2019-01-25 – 2019-01-27 (×4): 2.5 mg via ORAL
  Filled 2019-01-25 (×4): qty 1

## 2019-01-25 MED ORDER — METRONIDAZOLE IN NACL 5-0.79 MG/ML-% IV SOLN
500.0000 mg | Freq: Three times a day (TID) | INTRAVENOUS | Status: DC
  Administered 2019-01-25 – 2019-01-27 (×5): 500 mg via INTRAVENOUS
  Filled 2019-01-25 (×7): qty 100

## 2019-01-25 MED ORDER — TIMOLOL MALEATE 0.5 % OP SOLN
1.0000 [drp] | Freq: Two times a day (BID) | OPHTHALMIC | Status: DC
  Administered 2019-01-25 – 2019-01-26 (×3): 1 [drp] via OPHTHALMIC
  Filled 2019-01-25: qty 5

## 2019-01-25 MED ORDER — DOCUSATE SODIUM 100 MG PO CAPS
100.0000 mg | ORAL_CAPSULE | Freq: Two times a day (BID) | ORAL | Status: DC
  Administered 2019-01-25 – 2019-01-27 (×3): 100 mg via ORAL
  Filled 2019-01-25 (×3): qty 1

## 2019-01-25 MED ORDER — LEVOTHYROXINE SODIUM 50 MCG PO TABS
50.0000 ug | ORAL_TABLET | Freq: Every day | ORAL | Status: DC
  Administered 2019-01-26 – 2019-01-27 (×2): 50 ug via ORAL
  Filled 2019-01-25 (×2): qty 1

## 2019-01-25 MED ORDER — INSULIN ASPART 100 UNIT/ML ~~LOC~~ SOLN
0.0000 [IU] | Freq: Every day | SUBCUTANEOUS | Status: DC
  Administered 2019-01-25: 3 [IU] via SUBCUTANEOUS
  Filled 2019-01-25: qty 1

## 2019-01-25 MED ORDER — DORZOLAMIDE HCL 2 % OP SOLN
1.0000 [drp] | Freq: Three times a day (TID) | OPHTHALMIC | Status: DC
  Administered 2019-01-25 – 2019-01-27 (×5): 1 [drp] via OPHTHALMIC
  Filled 2019-01-25: qty 10

## 2019-01-25 MED ORDER — COLLAGENASE 250 UNIT/GM EX OINT
1.0000 "application " | TOPICAL_OINTMENT | Freq: Every day | CUTANEOUS | Status: DC | PRN
  Filled 2019-01-25: qty 30

## 2019-01-25 MED ORDER — CITALOPRAM HYDROBROMIDE 20 MG PO TABS
10.0000 mg | ORAL_TABLET | Freq: Every day | ORAL | Status: DC
  Administered 2019-01-26 – 2019-01-27 (×2): 10 mg via ORAL
  Filled 2019-01-25 (×2): qty 1

## 2019-01-25 NOTE — Progress Notes (Signed)
Care Alignment Note  Advanced Directives Documents (Living Will, Power of Attorney) currently in the EHR no advanced directives documents available .  Has the patient discussed their wishes with their family/healthcare power of attorney no.  What does the patient/decision maker understand about their medical condition and the natural course of their disease.  Sepsis secondary to multifocal pneumonia.  Infected left BKA stump.  Acute kidney injury.  Chronic diastolic CHF and diabetes mellitus.  Patient clearly wishes to be full code  What is the patient/decision maker's biggest fear or concern for the future pain and suffering   What is the most important goal for this patient should their health condition worsen longevity.  Current   Code Status: Full Code  Current code status has been reviewed/updated.  Time spent:18 minutes

## 2019-01-25 NOTE — Telephone Encounter (Signed)
noted 

## 2019-01-25 NOTE — ED Triage Notes (Signed)
Pt ems  from home with fever x 3 days. Pt s/p left bka in June and being followed by home health. Ems was called due to concerns for sepsis. Ems found pt to be sob and gave 1 albuterol neb and 125 of solumedrol.  Pt also has a right foot ulcer.

## 2019-01-25 NOTE — Telephone Encounter (Signed)
[facility] nurse left v/m with update for pt; Lilia Pro said today T 100.1, increased heart rate,wheezing throughout, Lilia Pro thinks pt looks terrible. Pt did go to [facility] ED while Lilia Pro was at pts home. FYI to Kenton Kingfisher NP.

## 2019-01-25 NOTE — Progress Notes (Signed)
CODE SEPSIS - PHARMACY COMMUNICATION  **Broad Spectrum Antibiotics should be administered within 1 hour of Sepsis diagnosis**  Time Code Sepsis Called/Page Received: 1353  Antibiotics Ordered: Vancomycin & Zosyn  Time of 1st antibiotic administration: 1446  Additional action taken by pharmacy: No abx ordered initially. Spoke with MD at 1430.    [place] Resident 01/25/2019  2:50 PM

## 2019-01-25 NOTE — ED Provider Notes (Signed)
[facility] Emergency Department Provider Note       Time seen: ----------------------------------------- 1:47 PM on 01/25/2019 -----------------------------------------  I have reviewed the triage vital signs and the nursing notes.  HISTORY   Chief Complaint No chief complaint on file.   HPI Shawn Antenucci. is a 85 y.o. male with a history of arthritis, coronary artery disease, chronic atrial fibrillation, heart failure, COPD, hyperlipidemia, hypertension, left below the knee amputation who presents to the ED for fever for the past several days.  Patient has also had some shortness of breath and was given a breathing treatment and steroids in route.  There were concerns about the wound on his left BKA stump as well as an ulceration on his right heel and over his patella.  Patient states he feels weak but otherwise denies complaints.  Past Medical History:  Diagnosis Date  . Arthritis   . CAD    a. MI 01/29/1996 tx'd w/ TPA @ [facility]; b. Myoview 06/2005: EF 50%, scar @ apex, mild peri-infarct ischemia  . Cancer ([place])    skin  . Chronic atrial fibrillation ([place])    a. since 2006; b. on warfarin  . Chronic diastolic CHF (congestive heart failure) ([place])    a. echo 04/2006: EF lower limits of nl, mod LVH, mild aortic root dilatation, & mild MR, biatrial enlargement; b. echo 04/2013: EF 60%, mod dilated LA, mild MR & TR, mod pulm HTN w/ RV systolic pressure 53, c. echo 04/21/14: EF 55-60%, unable to exclude WMA, severely dilated LA 6.6 cm, nl RVSP, mildly dilated aortic root  . COPD (chronic obstructive pulmonary disease) (HCC)    oxygen prn at home  . CVA [id number]   x2  . DM   . Falls   . GERD (gastroesophageal reflux disease)   . History of kidney stones   . HYPERLIPIDEMIA   . HYPERTENSION   . Kidney stone    a. s/p left ureteral stenting 04/24/14  . Left arm weakness    limited movement. S/P fall injury  . Neuropathy of both feet   . Poor balance   .  Wears dentures    full upper and lower    Patient Active Problem List   Diagnosis Date Noted  . Atherosclerosis of native arteries of the extremities with ulceration ([place]) 12/26/2018  . Hx of BKA, left ([place]) 10/25/2018  . Amputated toe, left ([place]) 06/19/2018  . PVD (peripheral vascular disease) ([place]) 06/19/2018  . Acquired hypothyroidism 11/22/2016  . Osteoarthritis, knee 11/22/2016  . Chronic diastolic heart failure ([place]) 12/12/2014  . A-fib ([place]) 10/10/2014  . BPH (benign prostatic hyperplasia) 05/14/2014  . Anxiety 05/14/2014  . Type 2 diabetes mellitus with stage 3 chronic kidney disease ([place]) 05/14/2014  . Chronic obstructive pulmonary disease ([place]) 05/14/2014  . Repeated falls 09/24/2013  . Obstructive sleep apnea 09/14/2013  . Insomnia 08/07/2013  . Hyperlipidemia 02/26/2009  . MITRAL REGURGITATION 02/26/2009  . Essential hypertension 02/26/2009  . MI 02/26/2009  . Coronary artery disease of native artery of native heart with stable angina pectoris ([place]) 02/26/2009  . Chronic atrial fibrillation 02/26/2009  . CVA (cerebral vascular accident) ([place]) 02/26/2009    Past Surgical History:  Procedure Laterality Date  . AMPUTATION Left 10/06/2018   Procedure: AMPUTATION BELOW KNEE;  Surgeon: Algernon Huxley, MD;  Location: [facility];  Service: General;  Laterality: Left;  . AMPUTATION TOE Left 06/13/2018   Procedure: 1st Ray Resection Left;  Surgeon: Sharlotte Alamo, DPM;  Location: [facility];  Service: Podiatry;  Laterality: Left;  . BLADDER SURGERY     stent placement   . CARDIAC CATHETERIZATION  1997   [facility]  . CAROTID STENT INSERTION  1997  . CATARACT EXTRACTION W/PHACO Left 10/11/2017   Procedure: CATARACT EXTRACTION PHACO AND INTRAOCULAR LENS PLACEMENT ([place]) COMPLICATED LEFT DIABETIC;  Surgeon: Leandrew Koyanagi, MD;  Location: [facility];  Service: Ophthalmology;  Laterality: Left;  MALYUGIN Diabetic - insulin  . CATARACT EXTRACTION W/PHACO Right 11/30/2017    Procedure: CATARACT EXTRACTION PHACO AND INTRAOCULAR LENS PLACEMENT ([place]) COMPLICATED  RIGHT DIABETIC;  Surgeon: Leandrew Koyanagi, MD;  Location: [facility];  Service: Ophthalmology;  Laterality: Right;  Diabetic - insulin  . CIRCUMCISION  2016  . CORONARY ANGIOPLASTY  1997   s/p stent placement x 2   . CYSTOSCOPY W/ URETERAL STENT REMOVAL Left 10/09/2014   Procedure: CYSTOSCOPY WITH STENT REMOVAL;  Surgeon: Hollice Espy, MD;  Location: [facility];  Service: Urology;  Laterality: Left;  . CYSTOSCOPY WITH STENT PLACEMENT Left 10/09/2014   Procedure: CYSTOSCOPY WITH STENT PLACEMENT;  Surgeon: Hollice Espy, MD;  Location: [facility];  Service: Urology;  Laterality: Left;  . IRRIGATION AND DEBRIDEMENT FOOT Left 09/15/2018   Procedure: IRRIGATION AND DEBRIDEMENT FOOT DELAY CLOSURE;  Surgeon: Samara Deist, DPM;  Location: [facility];  Service: Podiatry;  Laterality: Left;  . KIDNEY SURGERY  05/2013   s/p stent placement   . LOWER EXTREMITY ANGIOGRAPHY Left 06/12/2018   Procedure: Lower Extremity Angiography;  Surgeon: Algernon Huxley, MD;  Location: [facility];  Service: Cardiovascular;  Laterality: Left;  . LOWER EXTREMITY ANGIOGRAPHY Left 09/13/2018   Procedure: Lower Extremity Angiography;  Surgeon: Algernon Huxley, MD;  Location: [facility];  Service: Cardiovascular;  Laterality: Left;  . stents ureters Bilateral   . TONSILLECTOMY AND ADENOIDECTOMY  1959  . URETEROSCOPY WITH HOLMIUM LASER LITHOTRIPSY Left 10/09/2014   Procedure: URETEROSCOPY WITH HOLMIUM LASER LITHOTRIPSY;  Surgeon: Hollice Espy, MD;  Location: [facility];  Service: Urology;  Laterality: Left;    Allergies Contrast media [iodinated diagnostic agents], Morphine and related, Niacin and related, Other, and Amlodipine  Social History Social History   Tobacco Use  . Smoking status: Former Smoker    Packs/day: 2.00    Years: 40.00    Pack years: 80.00    Types: Cigarettes    Quit date: 05/24/1990    Years  since quitting: 28.6  . Smokeless tobacco: Former Systems developer    Quit date: 05/24/1990  Substance Use Topics  . Alcohol use: No  . Drug use: No   Review of Systems Constitutional: Positive for fever Cardiovascular: Negative for chest pain. Respiratory: Positive for shortness of breath Gastrointestinal: Negative for abdominal pain, vomiting and diarrhea. Musculoskeletal: Negative for back pain. Skin: Positive for wounds on his lower extremities Neurological: Negative for headaches, positive for generalized weakness  All systems negative/normal/unremarkable except as stated in the HPI  ____________________________________________   PHYSICAL EXAM:  VITAL SIGNS: ED Triage Vitals  Enc Vitals Group     BP      Pulse      Resp      Temp      Temp src      SpO2      Weight      Height      Head Circumference      Peak Flow      Pain Score      Pain Loc  Pain Edu?      Excl. in [place]?     Constitutional: Alert and oriented.  Mildly edematous appearing, no distress, chronically ill-appearing Eyes: Conjunctivae are normal. Normal extraocular movements. ENT      Head: Normocephalic and atraumatic.      Nose: No congestion/rhinnorhea.      Mouth/Throat: Mucous membranes are moist.      Neck: No stridor. Cardiovascular: Normal rate, regular rhythm. No murmurs, rubs, or gallops. Respiratory: Normal respiratory effort without tachypnea nor retractions.  Mild wheezing is noted Gastrointestinal: Soft and nontender. Normal bowel sounds Musculoskeletal: Left BKA, left BKA stump has some ulceration along the previous suture site, mild erythema is noted Neurologic:  Normal speech and language. No gross focal neurologic deficits are appreciated.  Skin: Left BKA ulceration, ulceration over the patella, ulceration over the lateral aspect of the right heel Psychiatric: Mood and affect are normal. Speech and behavior are normal.  ____________________________________________  EKG: Interpreted  by me.  Atrial fibrillation with a rate of 100 bpm, possible old anterior infarct, normal axis, normal QT  ____________________________________________  ED COURSE:  As part of my medical decision making, I reviewed the following data within the [facility] History obtained from family if available, nursing notes, old chart and ekg, as well as notes from prior ED visits. Patient presented for possible sepsis, we will assess with labs and imaging as indicated at this time.   Procedures  [name]. was evaluated in Emergency Department on 01/25/2019 for the symptoms described in the history of present illness. He was evaluated in the context of the global COVID-19 pandemic, which necessitated consideration that the patient might be at risk for infection with the SARS-CoV-2 virus that causes COVID-19. Institutional protocols and algorithms that pertain to the evaluation of patients at risk for COVID-19 are in a state of rapid change based on information released by regulatory bodies including the CDC and federal and state organizations. These policies and algorithms were followed during the patient's care in the ED.  ____________________________________________   LABS (pertinent positives/negatives)  Labs Reviewed  LACTIC ACID, PLASMA - Abnormal; Notable for the following components:      Result Value   Lactic Acid, Venous 3.1 (*)    All other components within normal limits  COMPREHENSIVE METABOLIC PANEL - Abnormal; Notable for the following components:   Potassium 3.4 (*)    Chloride 94 (*)    Glucose, Bld 166 (*)    BUN 44 (*)    Creatinine, Ser 1.80 (*)    Calcium 8.5 (*)    Albumin 3.0 (*)    GFR calc non Af Amer 34 (*)    GFR calc Af Amer 39 (*)    All other components within normal limits  CBC WITH DIFFERENTIAL/PLATELET - Abnormal; Notable for the following components:   RDW 16.1 (*)    All other components within normal limits  BLOOD GAS, VENOUS -  Abnormal; Notable for the following components:   Bicarbonate 32.4 (*)    Acid-Base Excess 6.6 (*)    All other components within normal limits  CULTURE, BLOOD (ROUTINE X 2)  CULTURE, BLOOD (ROUTINE X 2)  URINE CULTURE  SARS CORONAVIRUS 2 (TAT 6-24 HRS)  LACTIC ACID, PLASMA  URINALYSIS, COMPLETE (UACMP) WITH MICROSCOPIC  BRAIN NATRIURETIC PEPTIDE   CRITICAL CARE Performed by: Laurence Aly   Total critical care time: 30 minutes  Critical care time was exclusive of separately billable procedures and treating other  patients.  Critical care was necessary to treat or prevent imminent or life-threatening deterioration.  Critical care was time spent personally by me on the following activities: development of treatment plan with patient and/or surrogate as well as nursing, discussions with consultants, evaluation of patient's response to treatment, examination of patient, obtaining history from patient or surrogate, ordering and performing treatments and interventions, ordering and review of laboratory studies, ordering and review of radiographic studies, pulse oximetry and re-evaluation of patient's condition.  RADIOLOGY Images were viewed by me  Chest x-ray, left knee x-rays IMPRESSION:  1. Hazy infiltrates in the left perihilar region and in the right  upper and lower lobes.  2. Cardiomegaly with pulmonary vascular congestion. The hazy  infiltrates could represent pulmonary edema or pneumonia.   IMPRESSION:  No significant abnormality. Specifically, no evidence of  osteomyelitis in the tibia or fibula.  ____________________________________________   DIFFERENTIAL DIAGNOSIS   Sepsis, dehydration, electrolyte abnormality, osteomyelitis, pneumonia, COVID-19  FINAL ASSESSMENT AND PLAN  Possible sepsis, pulmonary vascular congestion, lactic acidosis   Plan: The patient had presented for fever for the past several days. Patient's labs revealed a normal white blood cell  count but elevated lactic acid level.  BUN and creatinine were mildly elevated compared to prior.  Patient's imaging was concerning for possible infiltrates, also some pulmonary edema.  No signs of osteomyelitis.  We did order broad-spectrum antibiotics, I am concerned about giving him significant fluid because of concerns for volume overload.  I will discuss with the hospitalist for admission.   Laurence Aly, MD    Note: This note was generated in part or whole with voice recognition software. Voice recognition is usually quite accurate but there are transcription errors that can and very often do occur. I apologize for any typographical errors that were not detected and corrected.     Earleen Newport, MD 01/25/19 1504

## 2019-01-25 NOTE — Telephone Encounter (Signed)
Thanks for the update. Will review ER notes once completed.

## 2019-01-25 NOTE — H&P (Addendum)
[facility] NAME: [name]    MR#:  [id number]  DATE OF BIRTH:  [birth year]  DATE OF ADMISSION:  01/25/2019  PRIMARY CARE PHYSICIAN: Jearld Fenton, NP   REQUESTING/REFERRING PHYSICIAN: Lenise Arena  CHIEF COMPLAINT:   Chief Complaint  Patient presents with  . Code Sepsis    HISTORY OF PRESENT ILLNESS:  [name]  is a 85 y.o. male with a known history of CAD, chronic atrial fibrillation on anticoagulation with Eliquis, chronic diastolic CHF, COPD, CVA, diabetes mellitus and peripheral artery disease status post left BKA who presented to the emergency room with complaints of fevers.  Patient noted a fever of 101.9 about 4 days ago at home.  Admitted to some shortness of breath and cough occasionally productive of sputum over the last several days.  Patient also noted to have ulceration on the right heel and the left BKA stump.  Chest x-ray done with findings consistent with multifocal pneumonia.  Patient was diagnosed with probable sepsis as evidenced by presence of tachycardia and tachypnea MD setting of ongoing infection.  Given broad-spectrum IV antibiotics with vancomycin and Zosyn.  Medical service called to admit patient for further evaluation.   PAST MEDICAL HISTORY:   Past Medical History:  Diagnosis Date  . Arthritis   . CAD    a. MI 01/29/1996 tx'd w/ TPA @ [facility]; b. Myoview 06/2005: EF 50%, scar @ apex, mild peri-infarct ischemia  . Cancer ([place])    skin  . Chronic atrial fibrillation ([place])    a. since 2006; b. on warfarin  . Chronic diastolic CHF (congestive heart failure) ([place])    a. echo 04/2006: EF lower limits of nl, mod LVH, mild aortic root dilatation, & mild MR, biatrial enlargement; b. echo 04/2013: EF 60%, mod dilated LA, mild MR & TR, mod pulm HTN w/ RV systolic pressure 53, c. echo 04/21/14: EF 55-60%, unable to exclude WMA, severely dilated LA 6.6 cm, nl RVSP, mildly dilated aortic root  . COPD  (chronic obstructive pulmonary disease) (HCC)    oxygen prn at home  . CVA 4081,4481   x2  . DM   . Falls   . GERD (gastroesophageal reflux disease)   . History of kidney stones   . HYPERLIPIDEMIA   . HYPERTENSION   . Kidney stone    a. s/p left ureteral stenting 04/24/14  . Left arm weakness    limited movement. S/P fall injury  . Neuropathy of both feet   . Poor balance   . Wears dentures    full upper and lower    PAST SURGICAL HISTORY:   Past Surgical History:  Procedure Laterality Date  . AMPUTATION Left 10/06/2018   Procedure: AMPUTATION BELOW KNEE;  Surgeon: Algernon Huxley, MD;  Location: [facility];  Service: General;  Laterality: Left;  . AMPUTATION TOE Left 06/13/2018   Procedure: 1st Ray Resection Left;  Surgeon: Sharlotte Alamo, DPM;  Location: [facility];  Service: Podiatry;  Laterality: Left;  . BLADDER SURGERY     stent placement   . CARDIAC CATHETERIZATION  1997   [facility]  . CAROTID STENT INSERTION  1997  . CATARACT EXTRACTION W/PHACO Left 10/11/2017   Procedure: CATARACT EXTRACTION PHACO AND INTRAOCULAR LENS PLACEMENT ([place]) COMPLICATED LEFT DIABETIC;  Surgeon: Leandrew Koyanagi, MD;  Location: [facility];  Service: Ophthalmology;  Laterality: Left;  MALYUGIN Diabetic - insulin  . CATARACT EXTRACTION W/PHACO Right 11/30/2017   Procedure: CATARACT  EXTRACTION PHACO AND INTRAOCULAR LENS PLACEMENT ([place]) COMPLICATED  RIGHT DIABETIC;  Surgeon: Leandrew Koyanagi, MD;  Location: [facility];  Service: Ophthalmology;  Laterality: Right;  Diabetic - insulin  . CIRCUMCISION  2016  . CORONARY ANGIOPLASTY  1997   s/p stent placement x 2   . CYSTOSCOPY W/ URETERAL STENT REMOVAL Left 10/09/2014   Procedure: CYSTOSCOPY WITH STENT REMOVAL;  Surgeon: Hollice Espy, MD;  Location: [facility];  Service: Urology;  Laterality: Left;  . CYSTOSCOPY WITH STENT PLACEMENT Left 10/09/2014   Procedure: CYSTOSCOPY WITH STENT PLACEMENT;  Surgeon: Hollice Espy, MD;  Location: [facility];   Service: Urology;  Laterality: Left;  . IRRIGATION AND DEBRIDEMENT FOOT Left 09/15/2018   Procedure: IRRIGATION AND DEBRIDEMENT FOOT DELAY CLOSURE;  Surgeon: Samara Deist, DPM;  Location: [facility];  Service: Podiatry;  Laterality: Left;  . KIDNEY SURGERY  05/2013   s/p stent placement   . LOWER EXTREMITY ANGIOGRAPHY Left 06/12/2018   Procedure: Lower Extremity Angiography;  Surgeon: Algernon Huxley, MD;  Location: [facility];  Service: Cardiovascular;  Laterality: Left;  . LOWER EXTREMITY ANGIOGRAPHY Left 09/13/2018   Procedure: Lower Extremity Angiography;  Surgeon: Algernon Huxley, MD;  Location: [facility];  Service: Cardiovascular;  Laterality: Left;  . stents ureters Bilateral   . TONSILLECTOMY AND ADENOIDECTOMY  1959  . URETEROSCOPY WITH HOLMIUM LASER LITHOTRIPSY Left 10/09/2014   Procedure: URETEROSCOPY WITH HOLMIUM LASER LITHOTRIPSY;  Surgeon: Hollice Espy, MD;  Location: [facility];  Service: Urology;  Laterality: Left;    SOCIAL HISTORY:   Social History   Tobacco Use  . Smoking status: Former Smoker    Packs/day: 2.00    Years: 40.00    Pack years: 80.00    Types: Cigarettes    Quit date: 05/24/1990    Years since quitting: 28.6  . Smokeless tobacco: Former Systems developer    Quit date: 05/24/1990  Substance Use Topics  . Alcohol use: No    FAMILY HISTORY:   Family History  Problem Relation Age of Onset  . Heart disease Mother   . Diabetes Mother   . Heart disease Maternal Grandmother   . Diabetes Maternal Grandmother   . Cancer Neg Hx   . Stroke Neg Hx     DRUG ALLERGIES:   Allergies  Allergen Reactions  . Contrast Media [Iodinated Diagnostic Agents] Shortness Of Breath  . Morphine And Related Other (See Comments)    Hallucinations   . Niacin And Related Dermatitis  . Other     Other reaction(s): SHORTNESS OF BREATH  . Amlodipine Rash    REVIEW OF SYSTEMS:   Review of Systems  Constitutional: Positive for fever. Negative for chills.  HENT:  Negative for hearing loss and tinnitus.   Eyes: Negative for blurred vision and double vision.  Respiratory: Positive for cough, sputum production and shortness of breath.   Cardiovascular: Negative for chest pain and palpitations.  Gastrointestinal: Negative for heartburn and nausea.  Genitourinary: Negative for dysuria and urgency.  Musculoskeletal: Negative for myalgias and neck pain.  Skin: Negative for itching and rash.  Neurological: Negative for dizziness and headaches.  Psychiatric/Behavioral: Negative for depression and hallucinations.    MEDICATIONS AT HOME:   Prior to Admission medications   Medication Sig Start Date End Date Taking? Authorizing Provider  acetaminophen (TYLENOL) 650 MG CR tablet Take 650 mg by mouth every 8 (eight) hours as needed for pain.     [provider]  albuterol (PROAIR HFA) 108 (  90 Base) MCG/ACT inhaler USE 1 TO 2 INHALATIONS EVERY 4 HOURS AS NEEDED Patient taking differently: Inhale 1-2 puffs into the lungs every 4 (four) hours as needed for wheezing or shortness of breath.  11/09/17   Wilhelmina Mcardle, MD  ALPRAZolam Duanne Moron) 0.25 MG tablet Take 1 tablet (0.25 mg total) by mouth at bedtime as needed for anxiety. 11/20/18   Jearld Fenton, NP  Amino Acids-Protein Hydrolys (FEEDING SUPPLEMENT, PRO-STAT SUGAR FREE 64,) LIQD Take 30 mLs by mouth 3 (three) times daily with meals.    [provider]  atorvastatin (LIPITOR) 40 MG tablet TAKE 1 TABLET DAILY 01/12/19   Jearld Fenton, NP  ciprofloxacin (CIPRO) 750 MG tablet Take 1 tablet (750 mg total) by mouth 2 (two) times daily. 01/17/19   Kris Hartmann, NP  citalopram (CELEXA) 10 MG tablet TAKE 1 TABLET DAILY 12/28/18   Baity, Coralie Keens, NP  COLCRYS 0.6 MG tablet TAKE 1 TABLET TWICE A DAY AS NEEDED (ACUTE GOUT FLARE) USE AS DIRECTED Patient taking differently: Take 0.6 mg by mouth 2 (two) times daily as needed (acute gout flare).  07/06/18   Jearld Fenton, NP  collagenase (SANTYL) ointment  Apply 1 application topically daily. 11/14/18   Kris Hartmann, NP  COMBIGAN 0.2-0.5 % ophthalmic solution Place 1 drop into both eyes 2 (two) times a day.  06/26/18   [provider]  diclofenac sodium (VOLTAREN) 1 % GEL Apply 4 g topically 4 (four) times daily. 01/05/18   Copland, Frederico Hamman, MD  docusate sodium (COLACE) 100 MG capsule Take 1 capsule (100 mg total) by mouth 2 (two) times daily. 10/10/18   Hillary Bow, MD  dorzolamide (TRUSOPT) 2 % ophthalmic solution Place 1 drop into both eyes 3 (three) times daily.  03/31/16   [provider]  ELIQUIS 5 MG TABS tablet TAKE 1 TABLET TWICE A DAY Patient taking differently: Take 5 mg by mouth 2 (two) times daily.  09/18/18   Minna Merritts, MD  ezetimibe (ZETIA) 10 MG tablet TAKE 1 TABLET DAILY 01/12/19   Jearld Fenton, NP  finasteride (PROSCAR) 5 MG tablet TAKE 1 TABLET DAILY 11/09/18   Jearld Fenton, NP  gabapentin (NEURONTIN) 100 MG capsule TAKE 1 CAPSULE THREE TIMES A DAY 11/19/18   Baity, Coralie Keens, NP  glipiZIDE (GLUCOTROL) 5 MG tablet Take 5 mg by mouth 2 (two) times daily before a meal.    [provider]  INCRUSE ELLIPTA 62.5 MCG/INH AEPB INHALE 1 PUFF DAILY 01/03/19   Flora Lipps, MD  insulin detemir (LEVEMIR) 100 UNIT/ML injection Inject 0.28 mLs (28 Units total) into the skin daily at 10 pm. Patient taking differently: Inject 42 Units into the skin daily at 10 pm.  06/15/18   Fritzi Mandes, MD  insulin lispro (HUMALOG) 100 UNIT/ML injection Inject 0.04 mLs (4 Units total) into the skin 3 (three) times daily with meals. Patient taking differently: Inject 10-18 Units into the skin 3 (three) times daily with meals. 18 am, 20 lunch, 16 supper 06/15/18   Fritzi Mandes, MD  Insulin Pen Needle (BD PEN NEEDLE NANO U/F) 32G X 4 MM MISC USE THREE TIMES A DAY FOR INSULIN ADMINISTRATION 02/15/18   Jearld Fenton, NP  Insulin Syringe-Needle U-100 30G X 1/2" 1 ML MISC 1 each by Does not apply route 3 (three) times daily. 07/19/14    Jearld Fenton, NP  latanoprost (XALATAN) 0.005 % ophthalmic solution Place 1 drop into both eyes at bedtime.  04/18/16   [provider]  levothyroxine (SYNTHROID) 50 MCG tablet TAKE 1 TABLET DAILY BEFORE BREAKFAST 12/25/18   Jearld Fenton, NP  magnesium citrate SOLN Take 296 mLs (1 Bottle total) by mouth daily as needed for severe constipation. 10/13/18   Hillary Bow, MD  metolazone (ZAROXOLYN) 5 MG tablet Take 1 tablet (5 mg total) by mouth daily as needed (swelling). 03/13/18   Minna Merritts, MD  metoprolol succinate (TOPROL-XL) 50 MG 24 hr tablet TAKE 1 TABLET TWICE A DAY 11/19/18   Baity, Coralie Keens, NP  montelukast (SINGULAIR) 10 MG tablet TAKE 1 TABLET AT BEDTIME Patient taking differently: Take 10 mg by mouth at bedtime.  02/24/18   Jearld Fenton, NP  Multiple Vitamin (MULTIVITAMIN) capsule Take 1 capsule by mouth daily.    [provider]  nitroGLYCERIN (NITROSTAT) 0.4 MG SL tablet Place 0.4 mg under the tongue every 5 (five) minutes as needed for chest pain.     [provider]  Oxycodone HCl 10 MG TABS Take 1 tablet (10 mg total) by mouth every 6 (six) hours as needed for moderate pain or severe pain. 10/10/18 10/10/19  Hillary Bow, MD  polyethylene glycol (MIRALAX / GLYCOLAX) 17 g packet Take 17 g by mouth daily. 10/11/18   Hillary Bow, MD  potassium chloride SA (K-DUR,KLOR-CON) 20 MEQ tablet Take 4 tablets (80 meq) by mouth twice daily, take an extra 1 tablet (20 meq) on the days you take metolazone Patient taking differently: Take 80 mEq by mouth 2 (two) times daily. Take 4 tablets (80 meq) by mouth twice daily at breakfast and at lunch, take an extra 1 tablet (20 meq) on the days you take metolazone put tablet in applesauce to aid swallowing 02/10/18   Minna Merritts, MD  predniSONE (DELTASONE) 10 MG tablet TAKE 1 TABLET DAILY WITH BREAKFAST 11/01/18   Jearld Fenton, NP  silver sulfADIAZINE (SILVADENE) 1 % cream Apply 1 application topically daily.     [provider]  SYMBICORT 160-4.5 MCG/ACT inhaler USE 2 INHALATIONS TWICE A DAY 11/08/18   Wilhelmina Mcardle, MD  tamsulosin (FLOMAX) 0.4 MG CAPS capsule TAKE 1 CAPSULE DAILY 12/25/18   Jearld Fenton, NP  torsemide (DEMADEX) 20 MG tablet Take 40 mg by mouth 2 (two) times daily.    [provider]  traMADol (ULTRAM) 50 MG tablet Take 1 tablet (50 mg total) by mouth 2 (two) times daily. 01/10/19   Jearld Fenton, NP  traZODone (DESYREL) 50 MG tablet Take 2 tablets (100 mg total) by mouth at bedtime. 11/20/18   Jearld Fenton, NP  vitamin C (ASCORBIC ACID) 250 MG tablet Take 250 mg by mouth 2 (two) times daily.    [provider]      VITAL SIGNS:  Blood pressure 118/60, pulse 95, temperature 98.5 F (36.9 C), temperature source Oral, resp. rate (!) 31, height 5\' 7"  (1.702 m), weight 79.4 kg, SpO2 95 %.  PHYSICAL EXAMINATION:  Physical Exam  GENERAL:  85 y.o.-year-old patient lying in the bed with no acute distress.  EYES: Pupils equal, round, reactive to light and accommodation. No scleral icterus. Extraocular muscles intact.  HEENT: Head atraumatic, normocephalic. Oropharynx and nasopharynx clear.  NECK:  Supple, no jugular venous distention. No thyroid enlargement, no tenderness.  LUNGS: Normal breath sounds bilaterally, no wheezing, rales,rhonchi or crepitation. No use of accessory muscles of respiration.  CARDIOVASCULAR: S1, S2 normal. No murmurs, rubs, or gallops.  ABDOMEN: Soft, nontender, nondistended.  Bowel sounds present. No organomegaly or mass.  EXTREMITIES: Patient with also with some purulent drainage on the left BKA stump as evidenced below.  Right heel ulcer on imaging below as well.  NEUROLOGIC: Cranial nerves II through XII are intact. Muscle strength 5/5 in all extremities. Sensation intact. Gait not checked.  PSYCHIATRIC: The patient is alert and oriented x 3.  SKIN: No obvious rash, lesion.  Presence of also as evidenced on imaging below.           LABORATORY PANEL:   CBC Recent Labs  Lab 01/25/19 1408  WBC 9.1  HGB 13.6  HCT 41.6  PLT 189   ------------------------------------------------------------------------------------------------------------------  Chemistries  Recent Labs  Lab 01/25/19 1408  NA 139  K 3.4*  CL 94*  CO2 31  GLUCOSE 166*  BUN 44*  CREATININE 1.80*  CALCIUM 8.5*  AST 26  ALT 17  ALKPHOS 63  BILITOT 0.6   ------------------------------------------------------------------------------------------------------------------  Cardiac Enzymes No results for input(s): TROPONINI in the last 168 hours. ------------------------------------------------------------------------------------------------------------------  RADIOLOGY:  Dg Chest Port 1 View  Result Date: 01/25/2019 CLINICAL DATA:  Fever. Shortness of breath. EXAM: PORTABLE CHEST 1 VIEW COMPARISON:  10/13/2018 FINDINGS: There is cardiomegaly with pulmonary vascular congestion. There is haziness in the left perihilar region and in the right upper and lower lobes, probably representing mild pulmonary edema or possibly faint pneumonia. No discrete pleural effusions. No acute bone abnormality. Prominent degenerative changes of the right shoulder. IMPRESSION: 1. Hazy infiltrates in the left perihilar region and in the right upper and lower lobes. 2. Cardiomegaly with pulmonary vascular congestion. The hazy infiltrates could represent pulmonary edema or pneumonia. Electronically Signed   By: Lorriane Shire M.D.   On: 01/25/2019 14:46   Dg Knee Complete 4 Views Left  Result Date: 01/25/2019 CLINICAL DATA:  Fever. Soft tissue wounds on the left BKA stump. EXAM: LEFT KNEE - COMPLETE 4+ VIEW COMPARISON:  Radiographs dated 12/25/2017 FINDINGS: Surgical margins of the fibula and tibia are sharp with no radiographic evidence suggestive of osteomyelitis. No significant arthritic changes of the knee. Vascular stent in the distal left thigh.  IMPRESSION: No significant abnormality. Specifically, no evidence of osteomyelitis in the tibia or fibula. Electronically Signed   By: Lorriane Shire M.D.   On: 01/25/2019 14:48      IMPRESSION AND PLAN:  Patient is an 85 year old male with history of CAD, chronic atrial fibrillation on anticoagulation with Eliquis, chronic diastolic CHF, COPD, CVA, diabetes mellitus and peripheral artery disease status post left BKA who is being admitted for management of probable sepsis secondary to multifocal pneumonia and infected left BKA stump.  1.  Probable sepsis Secondary to multifocal pneumonia and infected left BKA stump. Manifested by presence of tachycardia and tachypnea. IV fluid hydration while monitoring cardiopulmonary status closely in the setting of underlying chronic diastolic CHF Broad-spectrum antibiotics with vancomycin and Zosyn pending clinical course.  2.  Multifocal pneumonia Patient presented with fevers and cough with some shortness of breath. Placed on broad-spectrum IV antibiotics with vancomycin and Zosyn since patient appears septic Follow-up on cultures.  Monitor clinically.  3.  Infected left BKA stump. Requested for wound care notes improved. May consider getting vascular surgery consult in a.m if clinically indicated after evaluation by wound care nurse. Prior surgery was done by Dr. Lucky Cowboy who was already making arrangements to have patient follow-up with wound clinic according to the patient.  4.  Chronic diastolic CHF Stable.  Holding off on diuretics for now due  to her ongoing management of sepsis  5.  Hypothyroidism Continue Synthroid.  TSH level in a.m.  6.  Chronic atrial fibrillation Rate control Continue Eliquis  7.  Diabetes mellitus type 2 Holding off on oral diabetic regimen.  Placed on sliding scale insulin coverage.  Glycosylated hemoglobin level in a.m.  8.  Acute kidney injury Hold off on diuretics.  Placed on gentle IV fluid hydration.   Follow-up on renal function in a.m.  DVT prophylaxis; patient already on Eliquis  All the records are reviewed and case discussed with ED provider. Management plans discussed with the patient, and he is in agreement.  CODE STATUS: Full code  TOTAL TIME TAKING CARE OF THIS PATIENT: 64 minutes.    Ashleymarie Granderson M.D on 01/25/2019 at 4:15 PM  Between 7am to 6pm - Pager - [phone]  After 6pm go to [email or web address] - Proofreader  [facility]  Office  [phone]  CC: Primary care physician; Jearld Fenton, NP   Note: This dictation was prepared with Dragon dictation along with smaller phrase technology. Any transcriptional errors that result from this process are unintentional.

## 2019-01-25 NOTE — Consult Note (Signed)
Pharmacy Antibiotic Note  [name]. is a 85 y.o. male admitted on 01/25/2019 with sepsis. Patient with history of COPD, PAD s/p left BKA, Afib, CVA who presents with fevers and SOB with sputum production over last several days. Determined to be septic secondary to multifocal pneumonia and infected left BKA stump. Pharmacy has been consulted for vancomycin and zosyn dosing.   Plan: Discussed with provider concerning patient's apparent AKI and nephrotoxic combination of vancomycin and zosyn. Will change therapy to vancomycin + cefepime + metronidazole per provider.   Metronidazole 500 mg q8h  Cefepime 2 g q12h (renally adjusted)  Loading dose of vancomycin 2 g in the ED. Will dose based on variable renal function considering possible AKI. Will re-assess renal function in the morning.    Height: 5\' 7"  (170.2 cm) Weight: 175 lb (79.4 kg) IBW/kg (Calculated) : 66.1  Temp (24hrs), Avg:98.5 F (36.9 C), Min:98.5 F (36.9 C), Max:98.5 F (36.9 C)  Recent Labs  Lab 01/25/19 1408  WBC 9.1  CREATININE 1.80*  LATICACIDVEN 3.1*    Estimated Creatinine Clearance: 30.3 mL/min (A) (by C-G formula based on SCr of 1.8 mg/dL (H)).    Allergies  Allergen Reactions  . Contrast Media [Iodinated Diagnostic Agents] Shortness Of Breath  . Morphine And Related Other (See Comments)    Hallucinations   . Niacin And Related Dermatitis  . Other     Other reaction(s): SHORTNESS OF BREATH  . Amlodipine Rash    Antimicrobials this admission: Zosyn 10/15 x1 Vancomycin 10/15 >>  Cefepime 10/15 >> Metronidazole 10/15 >>  Dose adjustments this admission: N/A  Microbiology results: 10/15 BCx: pending 10/15 UCx: pending 10/15 COVID 19: pending  Thank you for allowing pharmacy to be a part of this patient's care.  [place] Resident 01/25/2019 4:33 PM

## 2019-01-25 NOTE — Progress Notes (Signed)
ANTICOAGULATION CONSULT NOTE - Initial Consult  Pharmacy Consult for  Apixaban  Indication: atrial fibrillation  Allergies  Allergen Reactions  . Contrast Media [Iodinated Diagnostic Agents] Shortness Of Breath  . Morphine And Related Other (See Comments)    Hallucinations   . Niacin And Related Dermatitis  . Other     Other reaction(s): SHORTNESS OF BREATH  . Amlodipine Rash    Patient Measurements: Height: 5\' 6"  (167.6 cm) Weight: 184 lb (83.5 kg) IBW/kg (Calculated) : 63.8 Heparin Dosing Weight:   Vital Signs: Temp: 98.2 F (36.8 C) (10/15 1848) Temp Source: Oral (10/15 1848) BP: 118/60 (10/15 1848) Pulse Rate: 92 (10/15 1848)  Labs: Recent Labs    01/25/19 1408  HGB 13.6  HCT 41.6  PLT 189  CREATININE 1.80*    Estimated Creatinine Clearance: 30.4 mL/min (A) (by C-G formula based on SCr of 1.8 mg/dL (H)).   Medical History: Past Medical History:  Diagnosis Date  . Arthritis   . CAD    a. MI 01/29/1996 tx'd w/ TPA @ [place]; b. Myoview 06/2005: EF 50%, scar @ apex, mild peri-infarct ischemia  . Cancer ([place])    skin  . Chronic atrial fibrillation ([place])    a. since 2006; b. on warfarin  . Chronic diastolic CHF (congestive heart failure) ([place])    a. echo 04/2006: EF lower limits of nl, mod LVH, mild aortic root dilatation, & mild MR, biatrial enlargement; b. echo 04/2013: EF 60%, mod dilated LA, mild MR & TR, mod pulm HTN w/ RV systolic pressure 53, c. echo 04/21/14: EF 55-60%, unable to exclude WMA, severely dilated LA 6.6 cm, nl RVSP, mildly dilated aortic root  . COPD (chronic obstructive pulmonary disease) ([facility])    oxygen prn at home  . CVA [id number],7341   x2  . DM   . Falls   . GERD (gastroesophageal reflux disease)   . History of kidney stones   . HYPERLIPIDEMIA   . HYPERTENSION   . Kidney stone    a. s/p left ureteral stenting 04/24/14  . Left arm weakness    limited movement. S/P fall injury  . Neuropathy of both feet   . Poor balance   . Wears  dentures    full upper and lower    Medications:  Medications Prior to Admission  Medication Sig Dispense Refill Last Dose  . acetaminophen (TYLENOL) 650 MG CR tablet Take 650 mg by mouth every 8 (eight) hours as needed for pain.    prn at prn  . albuterol (PROAIR HFA) 108 (90 Base) MCG/ACT inhaler USE 1 TO 2 INHALATIONS EVERY 4 HOURS AS NEEDED (Patient taking differently: Inhale 1-2 puffs into the lungs every 4 (four) hours as needed for wheezing or shortness of breath. ) 25.5 g 3 01/25/2019 at 0800  . ALPRAZolam (XANAX) 0.25 MG tablet Take 1 tablet (0.25 mg total) by mouth at bedtime as needed for anxiety. 90 tablet 0 01/24/2019 at 2000  . atorvastatin (LIPITOR) 40 MG tablet TAKE 1 TABLET DAILY (Patient taking differently: Take 40 mg by mouth daily at 6 PM. ) 90 tablet 0 01/24/2019 at 1800  . ciprofloxacin (CIPRO) 750 MG tablet Take 1 tablet (750 mg total) by mouth 2 (two) times daily. 20 tablet 0 01/25/2019 at 0800  . citalopram (CELEXA) 10 MG tablet TAKE 1 TABLET DAILY (Patient taking differently: Take 10 mg by mouth daily. ) 90 tablet 3 01/25/2019 at 0800  . collagenase (SANTYL) ointment Apply 1 application topically daily. 30 g  1 Past Week at prn  . COMBIGAN 0.2-0.5 % ophthalmic solution Place 1 drop into both eyes 2 (two) times a day.    01/25/2019 at 0800  . diclofenac sodium (VOLTAREN) 1 % GEL Apply 4 g topically 4 (four) times daily. 5 Tube 5 Past Week at prn  . docusate sodium (COLACE) 100 MG capsule Take 1 capsule (100 mg total) by mouth 2 (two) times daily. 10 capsule 0 01/25/2019 at 0800  . dorzolamide (TRUSOPT) 2 % ophthalmic solution Place 1 drop into both eyes 3 (three) times daily.    01/25/2019 at 0800  . ELIQUIS 5 MG TABS tablet TAKE 1 TABLET TWICE A DAY (Patient taking differently: Take 5 mg by mouth 2 (two) times daily. ) 180 tablet 1 01/25/2019 at 0800  . ezetimibe (ZETIA) 10 MG tablet TAKE 1 TABLET DAILY 90 tablet 0 01/25/2019 at 0800  . finasteride (PROSCAR) 5 MG tablet  TAKE 1 TABLET DAILY 90 tablet 0 01/25/2019 at 0800  . gabapentin (NEURONTIN) 100 MG capsule TAKE 1 CAPSULE THREE TIMES A DAY 270 capsule 0 01/25/2019 at 0800  . glipiZIDE (GLUCOTROL) 5 MG tablet Take 5 mg by mouth 2 (two) times daily before a meal.   01/25/2019 at 0800  . INCRUSE ELLIPTA 62.5 MCG/INH AEPB INHALE 1 PUFF DAILY 90 each 3 01/25/2019 at 0800  . insulin detemir (LEVEMIR) 100 UNIT/ML injection Inject 0.28 mLs (28 Units total) into the skin daily at 10 pm. (Patient taking differently: Inject 42 Units into the skin daily at 10 pm. ) 10 mL 11 01/24/2019 at 2000  . insulin lispro (HUMALOG) 100 UNIT/ML injection Inject 0.04 mLs (4 Units total) into the skin 3 (three) times daily with meals. (Patient taking differently: Inject 10-18 Units into the skin 3 (three) times daily with meals. 18 am, 20 lunch, 16 supper) 10 mL 11 01/25/2019 at 0800  . latanoprost (XALATAN) 0.005 % ophthalmic solution Place 1 drop into both eyes at bedtime.    01/24/2019 at 2000  . levothyroxine (SYNTHROID) 50 MCG tablet TAKE 1 TABLET DAILY BEFORE BREAKFAST (Patient taking differently: Take 50 mcg by mouth daily before breakfast. ) 90 tablet 3 01/25/2019 at 0700  . metoprolol succinate (TOPROL-XL) 50 MG 24 hr tablet TAKE 1 TABLET TWICE A DAY (Patient taking differently: Take 50 mg by mouth 2 (two) times daily. ) 180 tablet 0 01/25/2019 at 0800  . montelukast (SINGULAIR) 10 MG tablet TAKE 1 TABLET AT BEDTIME (Patient taking differently: Take 10 mg by mouth at bedtime. ) 90 tablet 4 01/24/2019 at 2000  . Multiple Vitamin (MULTIVITAMIN) capsule Take 1 capsule by mouth daily.   01/25/2019 at 0800  . polyethylene glycol (MIRALAX / GLYCOLAX) 17 g packet Take 17 g by mouth daily. 14 each 0 Past Week at prn  . potassium chloride SA (K-DUR,KLOR-CON) 20 MEQ tablet Take 4 tablets (80 meq) by mouth twice daily, take an extra 1 tablet (20 meq) on the days you take metolazone (Patient taking differently: Take 80 mEq by mouth 2 (two) times  daily. Take 4 tablets (80 meq) by mouth twice daily at breakfast and at lunch, take an extra 1 tablet (20 meq) on the days you take metolazone put tablet in applesauce to aid swallowing) 732 tablet 3 01/25/2019 at 0800  . SYMBICORT 160-4.5 MCG/ACT inhaler USE 2 INHALATIONS TWICE A DAY 30.6 g 0 01/25/2019 at 0800  . tamsulosin (FLOMAX) 0.4 MG CAPS capsule TAKE 1 CAPSULE DAILY (Patient taking differently: Take 0.4 mg by mouth  daily after supper. ) 90 capsule 1 01/24/2019 at 1800  . torsemide (DEMADEX) 20 MG tablet Take 40 mg by mouth 2 (two) times daily.   01/25/2019 at 0800  . traMADol (ULTRAM) 50 MG tablet Take 1 tablet (50 mg total) by mouth 2 (two) times daily. 180 tablet 0 01/25/2019 at 0800  . traZODone (DESYREL) 50 MG tablet Take 2 tablets (100 mg total) by mouth at bedtime. 180 tablet 0 01/24/2019 at 2000  . vitamin C (ASCORBIC ACID) 250 MG tablet Take 250 mg by mouth 2 (two) times daily.   01/25/2019 at 0800  . Amino Acids-Protein Hydrolys (FEEDING SUPPLEMENT, PRO-STAT SUGAR FREE 64,) LIQD Take 30 mLs by mouth 3 (three) times daily with meals.     . COLCRYS 0.6 MG tablet TAKE 1 TABLET TWICE A DAY AS NEEDED (ACUTE GOUT FLARE) USE AS DIRECTED (Patient taking differently: Take 0.6 mg by mouth 2 (two) times daily as needed (acute gout flare). ) 90 tablet 7 prn at prn  . metolazone (ZAROXOLYN) 5 MG tablet Take 1 tablet (5 mg total) by mouth daily as needed (swelling). 90 tablet 3   . nitroGLYCERIN (NITROSTAT) 0.4 MG SL tablet Place 0.4 mg under the tongue every 5 (five) minutes as needed for chest pain.    prn at prn  . silver sulfADIAZINE (SILVADENE) 1 % cream Apply 1 application topically daily.   prn at prn    Assessment:  Goal of Therapy:     Plan:  Pt admitted on Apixaban 5 mg PO BID for Afib.   Age = 26 , SrCr  = 1.8 Will decrease dose to apixaban 2.5 mg PO BID per manufacturers recommendations.   Addie Alonge D 01/25/2019,6:53 PM

## 2019-01-26 ENCOUNTER — Encounter (INDEPENDENT_AMBULATORY_CARE_PROVIDER_SITE_OTHER): Payer: Medicare Other

## 2019-01-26 ENCOUNTER — Ambulatory Visit (INDEPENDENT_AMBULATORY_CARE_PROVIDER_SITE_OTHER): Payer: Medicare Other | Admitting: Nurse Practitioner

## 2019-01-26 LAB — BASIC METABOLIC PANEL
Anion gap: 13 (ref 5–15)
BUN: 48 mg/dL — ABNORMAL HIGH (ref 8–23)
CO2: 27 mmol/L (ref 22–32)
Calcium: 8.4 mg/dL — ABNORMAL LOW (ref 8.9–10.3)
Chloride: 101 mmol/L (ref 98–111)
Creatinine, Ser: 1.8 mg/dL — ABNORMAL HIGH (ref 0.61–1.24)
GFR calc Af Amer: 39 mL/min — ABNORMAL LOW (ref >60)
GFR calc non Af Amer: 34 mL/min — ABNORMAL LOW (ref >60)
Glucose, Bld: 317 mg/dL — ABNORMAL HIGH (ref 70–99)
Potassium: 3.2 mmol/L — ABNORMAL LOW (ref 3.5–5.1)
Sodium: 141 mmol/L (ref 135–145)

## 2019-01-26 LAB — CBC
HCT: 40.9 % (ref 39.0–52.0)
Hemoglobin: 13.1 g/dL (ref 13.0–17.0)
MCH: 28.1 pg (ref 26.0–34.0)
MCHC: 32 g/dL (ref 30.0–36.0)
MCV: 87.6 fL (ref 80.0–100.0)
Platelets: 165 10*3/uL (ref 150–400)
RBC: 4.67 MIL/uL (ref 4.22–5.81)
RDW: 16.1 % — ABNORMAL HIGH (ref 11.5–15.5)
WBC: 4.6 10*3/uL (ref 4.0–10.5)
nRBC: 0 % (ref 0.0–0.2)

## 2019-01-26 LAB — URINE CULTURE: Culture: NO GROWTH

## 2019-01-26 LAB — C-REACTIVE PROTEIN: CRP: 7.1 mg/dL — ABNORMAL HIGH (ref <1.0)

## 2019-01-26 LAB — PROCALCITONIN: Procalcitonin: 0.12 ng/mL

## 2019-01-26 LAB — GLUCOSE, CAPILLARY
Glucose-Capillary: 319 mg/dL — ABNORMAL HIGH (ref 70–99)
Glucose-Capillary: 264 mg/dL — ABNORMAL HIGH (ref 70–99)
Glucose-Capillary: 295 mg/dL — ABNORMAL HIGH (ref 70–99)
Glucose-Capillary: 238 mg/dL — ABNORMAL HIGH (ref 70–99)
Glucose-Capillary: 128 mg/dL — ABNORMAL HIGH (ref 70–99)

## 2019-01-26 LAB — MAGNESIUM: Magnesium: 2.1 mg/dL (ref 1.7–2.4)

## 2019-01-26 LAB — FIBRIN DERIVATIVES D-DIMER (ARMC ONLY): Fibrin derivatives D-dimer (ARMC): 592.58 ng/mL (FEU) — ABNORMAL HIGH (ref 0.00–499.00)

## 2019-01-26 LAB — MRSA PCR SCREENING: MRSA by PCR: NEGATIVE

## 2019-01-26 LAB — HEMOGLOBIN A1C
Hgb A1c MFr Bld: 6.7 % — ABNORMAL HIGH (ref 4.8–5.6)
Mean Plasma Glucose: 145.59 mg/dL

## 2019-01-26 LAB — FIBRINOGEN: Fibrinogen: 650 mg/dL — ABNORMAL HIGH (ref 210–475)

## 2019-01-26 LAB — TSH: TSH: 1.392 u[IU]/mL (ref 0.350–4.500)

## 2019-01-26 LAB — SARS CORONAVIRUS 2 (TAT 6-24 HRS): SARS Coronavirus 2: POSITIVE — AB

## 2019-01-26 LAB — FERRITIN: Ferritin: 260 ng/mL (ref 24–336)

## 2019-01-26 MED ORDER — INSULIN ASPART 100 UNIT/ML ~~LOC~~ SOLN
10.0000 [IU] | Freq: Once | SUBCUTANEOUS | Status: AC
  Administered 2019-01-26: 06:00:00 10 [IU] via SUBCUTANEOUS
  Filled 2019-01-26: qty 1

## 2019-01-26 MED ORDER — ZINC SULFATE 220 (50 ZN) MG PO CAPS
220.0000 mg | ORAL_CAPSULE | Freq: Every day | ORAL | Status: DC
  Administered 2019-01-26: 220 mg via ORAL
  Filled 2019-01-26: qty 1

## 2019-01-26 MED ORDER — FAMOTIDINE 40 MG/5ML PO SUSR
20.0000 mg | Freq: Every day | ORAL | Status: DC
  Administered 2019-01-26 – 2019-01-27 (×2): 20 mg via ORAL
  Filled 2019-01-26 (×2): qty 2.5

## 2019-01-26 MED ORDER — SODIUM CHLORIDE 0.9 % IV SOLN
100.0000 mg | INTRAVENOUS | Status: DC
  Filled 2019-01-26: qty 20

## 2019-01-26 MED ORDER — POTASSIUM CHLORIDE 20 MEQ PO PACK
40.0000 meq | PACK | Freq: Three times a day (TID) | ORAL | Status: AC
  Administered 2019-01-26 (×2): 40 meq via ORAL
  Filled 2019-01-26 (×2): qty 2

## 2019-01-26 MED ORDER — POTASSIUM CHLORIDE CRYS ER 20 MEQ PO TBCR
20.0000 meq | EXTENDED_RELEASE_TABLET | Freq: Once | ORAL | Status: DC
  Filled 2019-01-26: qty 1

## 2019-01-26 MED ORDER — INSULIN ASPART 100 UNIT/ML ~~LOC~~ SOLN
8.0000 [IU] | Freq: Three times a day (TID) | SUBCUTANEOUS | Status: DC
  Administered 2019-01-26 (×2): 8 [IU] via SUBCUTANEOUS
  Filled 2019-01-26 (×2): qty 1

## 2019-01-26 MED ORDER — CHOLECALCIFEROL 10 MCG (400 UNIT) PO TABS
400.0000 [IU] | ORAL_TABLET | Freq: Every day | ORAL | Status: DC
  Administered 2019-01-26 – 2019-01-27 (×2): 400 [IU] via ORAL
  Filled 2019-01-26 (×2): qty 1

## 2019-01-26 MED ORDER — COLLAGENASE 250 UNIT/GM EX OINT
TOPICAL_OINTMENT | Freq: Every day | CUTANEOUS | Status: DC
  Administered 2019-01-26: 16:00:00 via TOPICAL
  Filled 2019-01-26: qty 30

## 2019-01-26 MED ORDER — DEXAMETHASONE SODIUM PHOSPHATE 10 MG/ML IJ SOLN
6.0000 mg | INTRAMUSCULAR | Status: DC
  Administered 2019-01-26 – 2019-01-27 (×2): 6 mg via INTRAVENOUS
  Filled 2019-01-26 (×2): qty 0.6

## 2019-01-26 MED ORDER — POTASSIUM CHLORIDE IN NACL 20-0.9 MEQ/L-% IV SOLN
INTRAVENOUS | Status: DC
  Administered 2019-01-26: 09:00:00 via INTRAVENOUS
  Filled 2019-01-26: qty 1000

## 2019-01-26 MED ORDER — INSULIN DETEMIR 100 UNIT/ML ~~LOC~~ SOLN
35.0000 [IU] | Freq: Every day | SUBCUTANEOUS | Status: DC
  Administered 2019-01-26: 21:00:00 35 [IU] via SUBCUTANEOUS
  Filled 2019-01-26 (×2): qty 0.35

## 2019-01-26 MED ORDER — SODIUM CHLORIDE 0.9 % IV SOLN
200.0000 mg | Freq: Once | INTRAVENOUS | Status: AC
  Administered 2019-01-26: 200 mg via INTRAVENOUS
  Filled 2019-01-26: qty 40

## 2019-01-26 MED ORDER — POTASSIUM CHLORIDE CRYS ER 20 MEQ PO TBCR: 40.0000 meq | EXTENDED_RELEASE_TABLET | Freq: Three times a day (TID) | ORAL | Status: DC

## 2019-01-26 MED ORDER — CHLORHEXIDINE GLUCONATE CLOTH 2 % EX PADS
6.0000 | MEDICATED_PAD | Freq: Every day | CUTANEOUS | Status: DC
  Administered 2019-01-26: 6 via TOPICAL

## 2019-01-26 NOTE — Consult Note (Addendum)
[facility] Nurse wound consult note Reason for Consult: Consult requested for left BKA and right heel.  [facility] team is performing consults remotely for patients who are in Covid ruleout isolation.  Reviewed photos and progress notes in the EMR.  Wound type: Right heel with unstageable pressure injury, yellow moist slough Left knee with full thickness wound, yellow slough Left stump with full thickness wound, large amt slough and some red moist woundbed.   Pressure Injury POA: Yes Dressing procedure/placement/frequency: Topical treatment orders provided for staff nurses to perform as follows: Float heel to reduce pressure.  Santyl ointment Q day to provide enzymatic debridement of nonviable tissue. Pt could benefit from an ortho service consult for the full thickness wound to his left stump.  Please re-consult if further assistance is needed.  Thank-you,  Julien Girt MSN, [place], [place], [place], [place]

## 2019-01-26 NOTE — Progress Notes (Signed)
Inpatient Diabetes Program Recommendations  AACE/ADA: New Consensus Statement on Inpatient Glycemic Control   Target Ranges:  Prepandial:   less than 140 mg/dL      Peak postprandial:   less than 180 mg/dL (1-2 hours)      Critically ill patients:  140 - 180 mg/dL   Results for EYAL, GREENHAW" (MRN [id number]) as of 01/26/2019 08:40  Ref. Range 01/25/2019 18:50 01/25/2019 21:23 01/26/2019 03:40 01/26/2019 08:06  Glucose-Capillary Latest Ref Range: 70 - 99 mg/dL 230 (H) 293 (H) 319 (H) 264 (H)   Review of Glycemic Control  Diabetes history: DM2 Outpatient Diabetes medications: Levemir 42 units daily, Humalog 18 units with breakfast, 20 units with lunch, 16 units with supper, Glipizide 5 mg BID Current orders for Inpatient glycemic control: Levemir 28 units QHS, Novolog 0-9 units TID with meals, Novolog 0-5 units QHS, Novolog 4 units TID with meals; Decadron 6 mg Q24H  Inpatient Diabetes Program Recommendations:   Insulin-Basal: If steroids are continued, please consider increasing Levemir to 35 units QHS.  Insulin-Meal Coverage: If steroids are continued, please consider increasing meal coverage to Novolog 8 units TID with meals if patient eats at least 50% of meals.  Thanks, Barnie Alderman, RN, MSN, CDE Diabetes Coordinator Inpatient Diabetes Program [phone] (Team Pager from 8am to 5pm)

## 2019-01-26 NOTE — Consult Note (Signed)
[name] for Electrolyte Monitoring and Replacement   Recent Labs: Potassium (mmol/L)  Date Value  01/26/2019 3.2 (L)  08/05/2014 3.6   Magnesium (mg/dL)  Date Value  01/26/2019 2.1  04/20/2014 2.1   Calcium (mg/dL)  Date Value  01/26/2019 8.4 (L)   Calcium, Total (mg/dL)  Date Value  08/05/2014 8.6 (L)   Albumin (g/dL)  Date Value  01/25/2019 3.0 (L)  05/02/2014 2.4 (L)   Phosphorus (mg/dL)  Date Value  05/13/2016 3.6  04/20/2014 3.0   Sodium (mmol/L)  Date Value  01/26/2019 141  02/21/2015 143  08/05/2014 137   Corrected Ca: 9.40 mg/dL  Assessment: 85 y.o.malewith a  history of CAD,chronic atrial fibrillation on anticoagulation with Eliquis,chronic diastolic CHF,COPD,CVA,diabetes mellitus and peripheral artery disease status post left BKA who presented to the emergency room with complaints of fevers, found to be SARS CoV-2 positive.  Goal of Therapy:  due to cardiac history: Potassium 4.0 - 5.1 mmol/L Magnesium 2.0 - 2.4 mg/dL Other electrolytes WNL  Plan:   Replace potassium 40 mEq po BID x 2  BMP, magnesium in am  Dallie Piles ,PharmD Clinical Pharmacist 01/26/2019 12:34 PM

## 2019-01-26 NOTE — Progress Notes (Signed)
Report received, this RN assuming care of patient.

## 2019-01-26 NOTE — Consult Note (Signed)
Pharmacy Antibiotic Note  [name]. is a 85 y.o. male admitted on 01/25/2019 with pneumonia. Patient with history of COPD, PAD s/p left BKA, Afib, CVA who presents with fevers and SOB with sputum production over last several days. Determined to be septic secondary to multifocal pneumonia and infected left BKA stump. Patient was originally on vancomycin, due to (-) MRSA PCR, d/c'ed vancomycin. Pharmacy has been consulted for cefepime dosing.   Plan: Continue Metronidazole 500 mg q8h Continue Cefepime 2 g q12h (renally adjusted, Crcl: 30 mL/min used)  Monitor: S/sx of infx and adverse effects  Height: 5\' 6"  (167.6 cm) Weight: 178 lb 5.6 oz (80.9 kg) IBW/kg (Calculated) : 63.8  Temp (24hrs), Avg:98.4 F (36.9 C), Min:98.2 F (36.8 C), Max:98.5 F (36.9 C)  Recent Labs  Lab 01/25/19 1408 01/25/19 1617 01/26/19 0358  WBC 9.1  --  4.6  CREATININE 1.80*  --  1.80*  LATICACIDVEN 3.1* 2.6*  --     Estimated Creatinine Clearance: 30 mL/min (A) (by C-G formula based on SCr of 1.8 mg/dL (H)).    Allergies  Allergen Reactions  . Contrast Media [Iodinated Diagnostic Agents] Shortness Of Breath  . Morphine And Related Other (See Comments)    Hallucinations   . Niacin And Related Dermatitis  . Other     Other reaction(s): SHORTNESS OF BREATH  . Amlodipine Rash    Antimicrobials this admission: Zosyn 10/15 x1 Vancomycin 10/15 >> 10/16 Cefepime 10/15 >> Metronidazole 10/15 >>  Microbiology results: 10/15 BCx: final pending, no growth <24hr 10/15 UCx: pending 10/15 COVID 19: positive  Thank you for allowing pharmacy to be a part of this patient's care.  Sallye Lat, PharmD Candidate 01/26/2019 2:11 PM

## 2019-01-26 NOTE — Progress Notes (Signed)
MEDICATION RELATED CONSULT NOTE - INITIAL   Pharmacy Consult for REMDESIVIR    Indication: COVID  Allergies  Allergen Reactions  . Contrast Media [Iodinated Diagnostic Agents] Shortness Of Breath  . Morphine And Related Other (See Comments)    Hallucinations   . Niacin And Related Dermatitis  . Other     Other reaction(s): SHORTNESS OF BREATH  . Amlodipine Rash   Patient Measurements: Height: 5\' 6"  (167.6 cm) Weight: 184 lb (83.5 kg) IBW/kg (Calculated) : 63.8  Vital Signs: Temp: 98.2 F (36.8 C) (10/15 1848) Temp Source: Oral (10/15 1848) BP: 107/69 (10/15 2225) Pulse Rate: 88 (10/15 2225) Intake/Output from previous day: 10/15 0701 - 10/16 0700 In: 958.7 [I.V.:541.3; IV Piggyback:417.5] Out: 950 [Urine:950] Intake/Output from this shift: Total I/O In: 958.7 [I.V.:541.3; IV Piggyback:417.5] Out: 950 [Urine:950]  Labs: Recent Labs    01/25/19 1408 01/26/19 0358  WBC 9.1 4.6  HGB 13.6 13.1  HCT 41.6 40.9  PLT 189 165  CREATININE 1.80* 1.80*  MG  --  2.1  ALBUMIN 3.0*  --   PROT 6.6  --   AST 26  --   ALT 17  --   ALKPHOS 63  --   BILITOT 0.6  --    Estimated Creatinine Clearance: 30.4 mL/min (A) (by C-G formula based on SCr of 1.8 mg/dL (H)).  Microbiology: Recent Results (from the past 720 hour(s))  Aerobic culture     Status: Abnormal   Collection Time: 01/12/19 12:00 AM  Result Value Ref Range Status   Aerobic Bacterial Culture Final report (A)  Final   Organism ID, Bacteria Comment (A)  Final    Comment: Pseudomonas aeruginosa Heavy growth    Organism ID, Bacteria Routine flora  Final    Comment: Heavy growth   Antimicrobial Susceptibility Comment  Final    Comment:       ** S = Susceptible; I = Intermediate; R = Resistant **                    P = Positive; N = Negative             MICS are expressed in micrograms per mL    Antibiotic                 RSLT#1    RSLT#2    RSLT#3    RSLT#4 Amikacin                       S Cefepime                        S Ceftazidime                    S Ciprofloxacin                  S Gentamicin                     S Imipenem                       S Levofloxacin                   S Meropenem                      S Piperacillin  S Ticarcillin                    R Tobramycin                     S   Urine Culture     Status: None   Collection Time: 01/22/19 11:59 AM   Specimen: Urine  Result Value Ref Range Status   MICRO NUMBER: [id number]  Final   SPECIMEN QUALITY: Adequate  Final   Sample Source URINE  Final   STATUS: FINAL  Final   Result: No Growth  Final  SARS CORONAVIRUS 2 (TAT 6-24 HRS) Nasopharyngeal Nasopharyngeal Swab     Status: Abnormal   Collection Time: 01/25/19  2:09 PM   Specimen: Nasopharyngeal Swab  Result Value Ref Range Status   SARS Coronavirus 2 POSITIVE (A) NEGATIVE Final    Comment: RESULT CALLED TO, READ BACK BY AND VERIFIED WITH: RN B MILLER @ 0149 01/26/19 BY S GEZAHEGN (NOTE) SARS-CoV-2 target nucleic acids are DETECTED. The SARS-CoV-2 RNA is generally detectable in upper and lower respiratory specimens during the acute phase of infection. Positive results are indicative of active infection with SARS-CoV-2. Clinical  correlation with patient history and other diagnostic information is necessary to determine patient infection status. Positive results do  not rule out bacterial infection or co-infection with other viruses. The expected result is Negative. Fact Sheet for Patients: SugarRoll.be Fact Sheet for Healthcare Providers: https://www.woods-mathews.com/ This test is not yet approved or cleared by the [place] FDA and  has been authorized for detection and/or diagnosis of SARS-CoV-2 by FDA under an Emergency Use Authorization (EUA). This EUA will remain  in effect (meaning this test can be used)  for the duration of the COVID-19 declaration under Section 564(b)(1) of the Act, 21  U.S.C. section 360bbb-3(b)(1), unless the authorization is terminated or revoked sooner. Performed at [facility], [place] [address] [place], [place] [address]    Medical History: Past Medical History:  Diagnosis Date  . Arthritis   . CAD    a. MI 01/29/1996 tx'd w/ TPA @ [facility]; b. Myoview 06/2005: EF 50%, scar @ apex, mild peri-infarct ischemia  . Cancer ([place])    skin  . Chronic atrial fibrillation ([place])    a. since 2006; b. on warfarin  . Chronic diastolic CHF (congestive heart failure) ([place])    a. echo 04/2006: EF lower limits of nl, mod LVH, mild aortic root dilatation, & mild MR, biatrial enlargement; b. echo 04/2013: EF 60%, mod dilated LA, mild MR & TR, mod pulm HTN w/ RV systolic pressure 53, c. echo 04/21/14: EF 55-60%, unable to exclude WMA, severely dilated LA 6.6 cm, nl RVSP, mildly dilated aortic root  . COPD (chronic obstructive pulmonary disease) ([facility])    oxygen prn at home  . CVA 6712,4580   x2  . DM   . Falls   . GERD (gastroesophageal reflux disease)   . History of kidney stones   . HYPERLIPIDEMIA   . HYPERTENSION   . Kidney stone    a. s/p left ureteral stenting 04/24/14  . Left arm weakness    limited movement. S/P fall injury  . Neuropathy of both feet   . Poor balance   . Wears dentures    full upper and lower   Medications:  Medications Prior to Admission  Medication Sig Dispense Refill Last Dose  . acetaminophen (TYLENOL) 650 MG CR tablet Take 650 mg by mouth  every 8 (eight) hours as needed for pain.    prn at prn  . albuterol (PROAIR HFA) 108 (90 Base) MCG/ACT inhaler USE 1 TO 2 INHALATIONS EVERY 4 HOURS AS NEEDED (Patient taking differently: Inhale 1-2 puffs into the lungs every 4 (four) hours as needed for wheezing or shortness of breath. ) 25.5 g 3 01/25/2019 at 0800  . ALPRAZolam (XANAX) 0.25 MG tablet Take 1 tablet (0.25 mg total) by mouth at bedtime as needed for anxiety. 90 tablet 0 01/24/2019 at 2000  . atorvastatin (LIPITOR) 40 MG tablet  TAKE 1 TABLET DAILY (Patient taking differently: Take 40 mg by mouth daily at 6 PM. ) 90 tablet 0 01/24/2019 at 1800  . ciprofloxacin (CIPRO) 750 MG tablet Take 1 tablet (750 mg total) by mouth 2 (two) times daily. 20 tablet 0 01/25/2019 at 0800  . citalopram (CELEXA) 10 MG tablet TAKE 1 TABLET DAILY (Patient taking differently: Take 10 mg by mouth daily. ) 90 tablet 3 01/25/2019 at 0800  . collagenase (SANTYL) ointment Apply 1 application topically daily. 30 g 1 Past Week at prn  . COMBIGAN 0.2-0.5 % ophthalmic solution Place 1 drop into both eyes 2 (two) times a day.    01/25/2019 at 0800  . diclofenac sodium (VOLTAREN) 1 % GEL Apply 4 g topically 4 (four) times daily. 5 Tube 5 Past Week at prn  . docusate sodium (COLACE) 100 MG capsule Take 1 capsule (100 mg total) by mouth 2 (two) times daily. 10 capsule 0 01/25/2019 at 0800  . dorzolamide (TRUSOPT) 2 % ophthalmic solution Place 1 drop into both eyes 3 (three) times daily.    01/25/2019 at 0800  . ELIQUIS 5 MG TABS tablet TAKE 1 TABLET TWICE A DAY (Patient taking differently: Take 5 mg by mouth 2 (two) times daily. ) 180 tablet 1 01/25/2019 at 0800  . ezetimibe (ZETIA) 10 MG tablet TAKE 1 TABLET DAILY 90 tablet 0 01/25/2019 at 0800  . finasteride (PROSCAR) 5 MG tablet TAKE 1 TABLET DAILY 90 tablet 0 01/25/2019 at 0800  . gabapentin (NEURONTIN) 100 MG capsule TAKE 1 CAPSULE THREE TIMES A DAY 270 capsule 0 01/25/2019 at 0800  . glipiZIDE (GLUCOTROL) 5 MG tablet Take 5 mg by mouth 2 (two) times daily before a meal.   01/25/2019 at 0800  . INCRUSE ELLIPTA 62.5 MCG/INH AEPB INHALE 1 PUFF DAILY 90 each 3 01/25/2019 at 0800  . insulin detemir (LEVEMIR) 100 UNIT/ML injection Inject 0.28 mLs (28 Units total) into the skin daily at 10 pm. (Patient taking differently: Inject 42 Units into the skin daily at 10 pm. ) 10 mL 11 01/24/2019 at 2000  . insulin lispro (HUMALOG) 100 UNIT/ML injection Inject 0.04 mLs (4 Units total) into the skin 3 (three) times daily  with meals. (Patient taking differently: Inject 10-18 Units into the skin 3 (three) times daily with meals. 18 am, 20 lunch, 16 supper) 10 mL 11 01/25/2019 at 0800  . latanoprost (XALATAN) 0.005 % ophthalmic solution Place 1 drop into both eyes at bedtime.    01/24/2019 at 2000  . levothyroxine (SYNTHROID) 50 MCG tablet TAKE 1 TABLET DAILY BEFORE BREAKFAST (Patient taking differently: Take 50 mcg by mouth daily before breakfast. ) 90 tablet 3 01/25/2019 at 0700  . metoprolol succinate (TOPROL-XL) 50 MG 24 hr tablet TAKE 1 TABLET TWICE A DAY (Patient taking differently: Take 50 mg by mouth 2 (two) times daily. ) 180 tablet 0 01/25/2019 at 0800  . montelukast (SINGULAIR) 10 MG tablet  TAKE 1 TABLET AT BEDTIME (Patient taking differently: Take 10 mg by mouth at bedtime. ) 90 tablet 4 01/24/2019 at 2000  . Multiple Vitamin (MULTIVITAMIN) capsule Take 1 capsule by mouth daily.   01/25/2019 at 0800  . polyethylene glycol (MIRALAX / GLYCOLAX) 17 g packet Take 17 g by mouth daily. 14 each 0 Past Week at prn  . potassium chloride SA (K-DUR,KLOR-CON) 20 MEQ tablet Take 4 tablets (80 meq) by mouth twice daily, take an extra 1 tablet (20 meq) on the days you take metolazone (Patient taking differently: Take 80 mEq by mouth 2 (two) times daily. Take 4 tablets (80 meq) by mouth twice daily at breakfast and at lunch, take an extra 1 tablet (20 meq) on the days you take metolazone put tablet in applesauce to aid swallowing) 732 tablet 3 01/25/2019 at 0800  . SYMBICORT 160-4.5 MCG/ACT inhaler USE 2 INHALATIONS TWICE A DAY 30.6 g 0 01/25/2019 at 0800  . tamsulosin (FLOMAX) 0.4 MG CAPS capsule TAKE 1 CAPSULE DAILY (Patient taking differently: Take 0.4 mg by mouth daily after supper. ) 90 capsule 1 01/24/2019 at 1800  . torsemide (DEMADEX) 20 MG tablet Take 40 mg by mouth 2 (two) times daily.   01/25/2019 at 0800  . traMADol (ULTRAM) 50 MG tablet Take 1 tablet (50 mg total) by mouth 2 (two) times daily. 180 tablet 0  01/25/2019 at 0800  . traZODone (DESYREL) 50 MG tablet Take 2 tablets (100 mg total) by mouth at bedtime. 180 tablet 0 01/24/2019 at 2000  . vitamin C (ASCORBIC ACID) 250 MG tablet Take 250 mg by mouth 2 (two) times daily.   01/25/2019 at 0800  . Amino Acids-Protein Hydrolys (FEEDING SUPPLEMENT, PRO-STAT SUGAR FREE 64,) LIQD Take 30 mLs by mouth 3 (three) times daily with meals.     . COLCRYS 0.6 MG tablet TAKE 1 TABLET TWICE A DAY AS NEEDED (ACUTE GOUT FLARE) USE AS DIRECTED (Patient taking differently: Take 0.6 mg by mouth 2 (two) times daily as needed (acute gout flare). ) 90 tablet 7 prn at prn  . metolazone (ZAROXOLYN) 5 MG tablet Take 1 tablet (5 mg total) by mouth daily as needed (swelling). 90 tablet 3   . nitroGLYCERIN (NITROSTAT) 0.4 MG SL tablet Place 0.4 mg under the tongue every 5 (five) minutes as needed for chest pain.    prn at prn  . silver sulfADIAZINE (SILVADENE) 1 % cream Apply 1 application topically daily.   prn at prn    Assessment: Pt with mild hypoxia after arrival to ICU, with O2 sats 90 to 91% on room air.  Pt's CXR at admission concerning for possible pneumonia, therefore per COVID-19 Protocol (and to try and avoid deterioration), will place pt on IV Steroids and Remdesivir.  Pt admitted to step down unit with  Covid 19 +, pneumonia, and infected BKA wound stump.   (Awaiting medication to arrive from [facility])  Goal of Therapy:  Eradicate infection.  Plan:  Remdesivir 200mg  IV loading dose x 1 then 100mg  daily   [place] Crane, [place] Kvamme A 01/26/2019,5:02 AM

## 2019-01-26 NOTE — Consult Note (Signed)
CRITICAL CARE PROGRESS NOTE    Name: [name]. MRN: [id number] DOB: 09-24-1931     LOS: 1   SUBJECTIVE FINDINGS & SIGNIFICANT EVENTS   Patient description:   [name]  is a 85 y.o. male with a known history of CAD, chronic atrial fibrillation on anticoagulation with Eliquis, chronic diastolic CHF, COPD, CVA, diabetes mellitus and peripheral artery disease status post left BKA who presented to the emergency room with complaints of fevers.  Patient noted a fever of 101.9 about 4 days ago at home.  Admitted to some shortness of breath and cough occasionally productive of sputum over the last several days.  Patient also noted to have ulceration on the right heel and the left BKA stump.  Chest x-ray done with findings consistent with multifocal pneumonia.  Patient was diagnosed with probable sepsis as evidenced by presence of tachycardia and tachypnea MD setting of ongoing infection.  Given broad-spectrum IV antibiotics with vancomycin and Zosyn.  Medical service called to admit patient for further evaluation.   Lines / Drains: PIC  Cultures / Sepsis markers: COVID+  Antibiotics: Remdesevir S/p vanc/zosyn  Protocols / Consultants: Plan for [facility] transfer  Tests / Events: CXR  Overnight: No events   PAST MEDICAL HISTORY   Past Medical History:  Diagnosis Date  . Arthritis   . CAD    a. MI 01/29/1996 tx'd w/ TPA @ [facility]; b. Myoview 06/2005: EF 50%, scar @ apex, mild peri-infarct ischemia  . Cancer ([place])    skin  . Chronic atrial fibrillation ([place])    a. since 2006; b. on warfarin  . Chronic diastolic CHF (congestive heart failure) ([place])    a. echo 04/2006: EF lower limits of nl, mod LVH, mild aortic root dilatation, & mild MR, biatrial enlargement; b. echo 04/2013: EF 60%, mod dilated LA, mild  MR & TR, mod pulm HTN w/ RV systolic pressure 53, c. echo 04/21/14: EF 55-60%, unable to exclude WMA, severely dilated LA 6.6 cm, nl RVSP, mildly dilated aortic root  . COPD (chronic obstructive pulmonary disease) (H[facility])    oxygen prn at home  . CVA 6433,2951   x2  . DM   . Falls   . GERD (gastroesophageal reflux disease)   . History of kidney stones   . HYPERLIPIDEMIA   . HYPERTENSION   . Kidney stone    a. s/p left ureteral stenting 04/24/14  . Left arm weakness    limited movement. S/P fall injury  . Neuropathy of both feet   . Poor balance   . Wears dentures    full upper and lower     SURGICAL HISTORY   Past Surgical History:  Procedure Laterality Date  . AMPUTATION Left 10/06/2018   Procedure: AMPUTATION BELOW KNEE;  Surgeon: Algernon Huxley, MD;  Location: [facility];  Service: General;  Laterality: Left;  . AMPUTATION TOE Left 06/13/2018   Procedure: 1st Ray Resection Left;  Surgeon: Sharlotte Alamo, DPM;  Location: [facility];  Service: Podiatry;  Laterality: Left;  . BLADDER SURGERY     stent placement   . CARDIAC CATHETERIZATION  1997   [facility]  . CAROTID STENT INSERTION  1997  . CATARACT EXTRACTION W/PHACO Left 10/11/2017   Procedure: CATARACT EXTRACTION PHACO AND INTRAOCULAR LENS PLACEMENT ([place]) COMPLICATED LEFT DIABETIC;  Surgeon: Leandrew Koyanagi, MD;  Location: [place];  Service: Ophthalmology;  Laterality: Left;  MALYUGIN Diabetic - insulin  . CATARACT EXTRACTION W/PHACO Right 11/30/2017   Procedure: CATARACT  EXTRACTION PHACO AND INTRAOCULAR LENS PLACEMENT ([place]) COMPLICATED  RIGHT DIABETIC;  Surgeon: Leandrew Koyanagi, MD;  Location: [place];  Service: Ophthalmology;  Laterality: Right;  Diabetic - insulin  . CIRCUMCISION  2016  . CORONARY ANGIOPLASTY  1997   s/p stent placement x 2   . CYSTOSCOPY W/ URETERAL STENT REMOVAL Left 10/09/2014   Procedure: CYSTOSCOPY WITH STENT REMOVAL;  Surgeon: Hollice Espy, MD;  Location: [facility];  Service:  Urology;  Laterality: Left;  . CYSTOSCOPY WITH STENT PLACEMENT Left 10/09/2014   Procedure: CYSTOSCOPY WITH STENT PLACEMENT;  Surgeon: Hollice Espy, MD;  Location: [facility];  Service: Urology;  Laterality: Left;  . IRRIGATION AND DEBRIDEMENT FOOT Left 09/15/2018   Procedure: IRRIGATION AND DEBRIDEMENT FOOT DELAY CLOSURE;  Surgeon: Samara Deist, DPM;  Location: [facility];  Service: Podiatry;  Laterality: Left;  . KIDNEY SURGERY  05/2013   s/p stent placement   . LOWER EXTREMITY ANGIOGRAPHY Left 06/12/2018   Procedure: Lower Extremity Angiography;  Surgeon: Algernon Huxley, MD;  Location: [place] [facility];  Service: Cardiovascular;  Laterality: Left;  . LOWER EXTREMITY ANGIOGRAPHY Left 09/13/2018   Procedure: Lower Extremity Angiography;  Surgeon: Algernon Huxley, MD;  Location: [place] [facility];  Service: Cardiovascular;  Laterality: Left;  . stents ureters Bilateral   . TONSILLECTOMY AND ADENOIDECTOMY  1959  . URETEROSCOPY WITH HOLMIUM LASER LITHOTRIPSY Left 10/09/2014   Procedure: URETEROSCOPY WITH HOLMIUM LASER LITHOTRIPSY;  Surgeon: Hollice Espy, MD;  Location: [facility];  Service: Urology;  Laterality: Left;     FAMILY HISTORY   Family History  Problem Relation Age of Onset  . Heart disease Mother   . Diabetes Mother   . Heart disease Maternal Grandmother   . Diabetes Maternal Grandmother   . Cancer Neg Hx   . Stroke Neg Hx      SOCIAL HISTORY   Social History   Tobacco Use  . Smoking status: Former Smoker    Packs/day: 2.00    Years: 40.00    Pack years: 80.00    Types: Cigarettes    Quit date: 05/24/1990    Years since quitting: 28.6  . Smokeless tobacco: Former Systems developer    Quit date: 05/24/1990  Substance Use Topics  . Alcohol use: No  . Drug use: No     MEDICATIONS   Current Medication:  Current Facility-Administered Medications:  .  0.9 % NaCl with KCl 20 mEq/ L  infusion, , Intravenous, Continuous, Dallie Piles, [facility], Last Rate: 100 mL/hr at 01/26/19 4010 .   acetaminophen (TYLENOL) tablet 650 mg, 650 mg, Oral, Q6H PRN, Ojie, Jude, MD .  albuterol (PROVENTIL) (2.5 MG/3ML) 0.083% nebulizer solution 2.5 mg, 2.5 mg, Inhalation, Q4H PRN, Ojie, Jude, MD .  ALPRAZolam Duanne Moron) tablet 0.25 mg, 0.25 mg, Oral, QHS PRN, Ojie, Jude, MD .  apixaban (ELIQUIS) tablet 2.5 mg, 2.5 mg, Oral, BID, Ojie, Jude, MD, 2.5 mg at 01/25/19 2225 .  atorvastatin (LIPITOR) tablet 40 mg, 40 mg, Oral, Daily, Ojie, Jude, MD, 40 mg at 01/26/19 0909 .  brimonidine (ALPHAGAN) 0.2 % ophthalmic solution 1 drop, 1 drop, Both Eyes, Q12H, 1 drop at 01/26/19 0921 **AND** timolol (TIMOPTIC) 0.5 % ophthalmic solution 1 drop, 1 drop, Both Eyes, BID, Charlett Nose, RPH, 1 drop at 01/26/19 0920 .  ceFEPIme (MAXIPIME) 2 g in sodium chloride 0.9 % 100 mL IVPB, 2 g, Intravenous, Q12H, Benita Gutter, RPH, Stopped at 01/26/19 0100 .  Chlorhexidine Gluconate Cloth 2 % PADS  6 each, 6 each, Topical, Daily, Bradly Bienenstock, NP, 6 each at 01/26/19 [phone] .  citalopram (CELEXA) tablet 10 mg, 10 mg, Oral, Daily, Ojie, Jude, MD, 10 mg at 01/26/19 0907 .  colchicine tablet 0.6 mg, 0.6 mg, Oral, BID PRN, Ojie, Jude, MD .  collagenase (SANTYL) ointment 1 application, 1 application, Topical, Daily PRN, Ojie, Jude, MD .  dexamethasone (DECADRON) injection 6 mg, 6 mg, Intravenous, Q24H, Darel Hong D, NP, 6 mg at 01/26/19 0547 .  diclofenac sodium (VOLTAREN) 1 % transdermal gel 4 g, 4 g, Topical, QID PRN, Ojie, Jude, MD .  docusate sodium (COLACE) capsule 100 mg, 100 mg, Oral, BID, Ojie, Jude, MD, 100 mg at 01/26/19 0908 .  dorzolamide (TRUSOPT) 2 % ophthalmic solution 1 drop, 1 drop, Both Eyes, TID, Ojie, Jude, MD, 1 drop at 01/26/19 0922 .  ezetimibe (ZETIA) tablet 10 mg, 10 mg, Oral, Daily, Ojie, Jude, MD .  feeding supplement (PRO-STAT SUGAR FREE 64) liquid 30 mL, 30 mL, Oral, TID WC, Ojie, Jude, MD, 30 mL at 01/26/19 0900 .  finasteride (PROSCAR) tablet 5 mg, 5 mg, Oral, Daily, Ojie, Jude, MD, 5 mg at  01/26/19 0905 .  gabapentin (NEURONTIN) capsule 100 mg, 100 mg, Oral, TID, Ojie, Jude, MD, 100 mg at 01/26/19 0909 .  influenza vaccine adjuvanted (FLUAD) injection 0.5 mL, 0.5 mL, Intramuscular, Tomorrow-1000, Ojie, Jude, MD .  insulin aspart (novoLOG) injection 0-5 Units, 0-5 Units, Subcutaneous, QHS, Ojie, Jude, MD, 3 Units at 01/25/19 2224 .  insulin aspart (novoLOG) injection 0-9 Units, 0-9 Units, Subcutaneous, TID WC, Ojie, Jude, MD, 5 Units at 01/26/19 0900 .  insulin aspart (novoLOG) injection 4 Units, 4 Units, Subcutaneous, TID WC, Ojie, Jude, MD, 4 Units at 01/26/19 0900 .  insulin detemir (LEVEMIR) injection 28 Units, 28 Units, Subcutaneous, Q2200, Ojie, Jude, MD, 28 Units at 01/25/19 2355 .  latanoprost (XALATAN) 0.005 % ophthalmic solution 1 drop, 1 drop, Both Eyes, QHS, Ojie, Jude, MD, 1 drop at 01/25/19 2232 .  levothyroxine (SYNTHROID) tablet 50 mcg, 50 mcg, Oral, Q0600, Stark Jock, Jude, MD, 50 mcg at 01/26/19 0551 .  magnesium citrate solution 1 Bottle, 1 Bottle, Oral, Daily PRN, Ojie, Jude, MD .  metoprolol succinate (TOPROL-XL) 24 hr tablet 50 mg, 50 mg, Oral, BID, Ojie, Jude, MD, 50 mg at 01/26/19 0906 .  metroNIDAZOLE (FLAGYL) IVPB 500 mg, 500 mg, Intravenous, Q8H, Benita Gutter, RPH, Last Rate: 100 mL/hr at 01/26/19 0403, 500 mg at 01/26/19 0403 .  mometasone-formoterol (DULERA) 200-5 MCG/ACT inhaler 2 puff, 2 puff, Inhalation, BID, Ojie, Jude, MD, 2 puff at 01/26/19 0900 .  montelukast (SINGULAIR) tablet 10 mg, 10 mg, Oral, QHS, Ojie, Jude, MD, 10 mg at 01/25/19 2225 .  multivitamin with minerals tablet 1 tablet, 1 tablet, Oral, Daily, Ojie, Jude, MD, 1 tablet at 01/26/19 0908 .  nitroGLYCERIN (NITROSTAT) SL tablet 0.4 mg, 0.4 mg, Sublingual, Q5 min PRN, Ojie, Jude, MD .  oxyCODONE (Oxy IR/ROXICODONE) immediate release tablet 10 mg, 10 mg, Oral, Q6H PRN, Ojie, Jude, MD .  polyethylene glycol (MIRALAX / GLYCOLAX) packet 17 g, 17 g, Oral, Daily, Ojie, Jude, MD, 17 g at 01/26/19  0910 .  [START ON 01/20/2019] remdesivir 100 mg in sodium chloride 0.9 % 250 mL IVPB, 100 mg, Intravenous, Q24H, Dallie Piles, RPH .  silver sulfADIAZINE (SILVADENE) 1 % cream 1 application, 1 application, Topical, Daily PRN, Ojie, Jude, MD .  tamsulosin (FLOMAX) capsule 0.4 mg, 0.4 mg, Oral, Daily, Stark Jock, Jude, MD,  0.4 mg at 01/26/19 0909 .  traMADol (ULTRAM) tablet 50 mg, 50 mg, Oral, BID, Ojie, Jude, MD, 50 mg at 01/26/19 0909 .  traZODone (DESYREL) tablet 100 mg, 100 mg, Oral, QHS, Ojie, Jude, MD, 100 mg at 01/25/19 2225 .  umeclidinium bromide (INCRUSE ELLIPTA) 62.5 MCG/INH 1 puff, 1 puff, Inhalation, Daily, Ojie, Jude, MD, 1 puff at 01/26/19 0900 .  vancomycin variable dose per unstable renal function (pharmacist dosing), , Does not apply, See admin instructions, Benita Gutter, RPH .  vitamin C (ASCORBIC ACID) tablet 250 mg, 250 mg, Oral, BID, Ojie, Jude, MD, 250 mg at 01/25/19 2225    ALLERGIES   Contrast media [iodinated diagnostic agents], Morphine and related, Niacin and related, Other, and Amlodipine    REVIEW OF SYSTEMS    ROS unable to obtain due to COVID precautions to preserve pPE  PHYSICAL EXAMINATION   Vital Signs: Temp:  [98.2 F (36.8 C)-98.5 F (36.9 C)] 98.4 F (36.9 C) (10/16 0400) Pulse Rate:  [61-109] 61 (10/16 0906) Resp:  [18-31] 22 (10/16 0700) BP: (101-135)/(48-85) 135/66 (10/16 0906) SpO2:  [85 %-98 %] 98 % (10/16 0700) Weight:  [79.4 kg-83.5 kg] 80.9 kg (10/16 0400)  GENERAL:NAD HEAD: Normocephalic, atraumatic.  EYES: Pupils equal, round.  No scleral icterus.  MOUTH: Moist mucosal membrane. NECK: Supple. No thyromegaly. No nodules. No JVD.  PULMONARY non tachypneic CARDIOVASCULAR: telemetry reviewed GASTROINTESTINAL: non distended.  MUSCULOSKELETAL:skin intact.  NEUROLOGIC: Mild distress due to acute illness SKIN:intact,warm,dry  COVID-19 DISASTER DECLARATION:   FULL CONTACT PHYSICAL EXAMINATION WAS NOT POSSIBLE DUE TO TREATMENT OF  COVID-19  AND CONSERVATION OF PERSONAL PROTECTIVE EQUIPMENT, LIMITED EXAM FINDINGS INCLUDE-    Patient assessed or the symptoms described in the history of present illness.  In the context of the Global COVID-19 pandemic, which necessitated consideration that the patient might be at risk for infection with the SARS-CoV-2 virus that causes COVID-19, Institutional protocols and algorithms that pertain to the evaluation of patients at risk for COVID-19 are in a state of rapid change based on information released by regulatory bodies including the CDC and federal and state organizations. These policies and algorithms were followed during the patient's care while in hospital.   PERTINENT DATA     Infusions: . 0.9 % NaCl with KCl 20 mEq / L 100 mL/hr at 01/26/19 0923  . ceFEPime (MAXIPIME) IV Stopped (01/26/19 0100)  . metronidazole 500 mg (01/26/19 0403)  . [START ON 01/16/2019] remdesivir 100 mg in NS 250 mL     Scheduled Medications: . apixaban  2.5 mg Oral BID  . atorvastatin  40 mg Oral Daily  . brimonidine  1 drop Both Eyes Q12H   And  . timolol  1 drop Both Eyes BID  . Chlorhexidine Gluconate Cloth  6 each Topical Daily  . citalopram  10 mg Oral Daily  . dexamethasone (DECADRON) injection  6 mg Intravenous Q24H  . docusate sodium  100 mg Oral BID  . dorzolamide  1 drop Both Eyes TID  . ezetimibe  10 mg Oral Daily  . feeding supplement (PRO-STAT SUGAR FREE 64)  30 mL Oral TID WC  . finasteride  5 mg Oral Daily  . gabapentin  100 mg Oral TID  . influenza vaccine adjuvanted  0.5 mL Intramuscular Tomorrow-1000  . insulin aspart  0-5 Units Subcutaneous QHS  . insulin aspart  0-9 Units Subcutaneous TID WC  . insulin aspart  4 Units Subcutaneous TID WC  . insulin detemir  28  Units Subcutaneous Q2200  . latanoprost  1 drop Both Eyes QHS  . levothyroxine  50 mcg Oral Q0600  . metoprolol succinate  50 mg Oral BID  . mometasone-formoterol  2 puff Inhalation BID  . montelukast  10 mg  Oral QHS  . multivitamin with minerals  1 tablet Oral Daily  . polyethylene glycol  17 g Oral Daily  . tamsulosin  0.4 mg Oral Daily  . traMADol  50 mg Oral BID  . traZODone  100 mg Oral QHS  . umeclidinium bromide  1 puff Inhalation Daily  . vancomycin variable dose per unstable renal function (pharmacist dosing)   Does not apply See admin instructions  . vitamin C  250 mg Oral BID   PRN Medications: acetaminophen, albuterol, ALPRAZolam, colchicine, collagenase, diclofenac sodium, magnesium citrate, nitroGLYCERIN, oxyCODONE, silver sulfADIAZINE Hemodynamic parameters:   Intake/Output: 10/15 0701 - 10/16 0700 In: 1372.8 [P.O.:50; I.V.:805.4; IV [id number]:756.4] Out: 950 [Urine:950]  Ventilator  Settings:      LAB RESULTS:  Basic Metabolic Panel: Recent Labs  Lab 01/25/19 1408 01/26/19 0358  NA 139 141  K 3.4* 3.2*  CL 94* 101  CO2 31 27  GLUCOSE 166* 317*  BUN 44* 48*  CREATININE 1.80* 1.80*  CALCIUM 8.5* 8.4*  MG  --  2.1   Liver Function Tests: Recent Labs  Lab 01/25/19 1408  AST 26  ALT 17  ALKPHOS 63  BILITOT 0.6  PROT 6.6  ALBUMIN 3.0*   No results for input(s): LIPASE, AMYLASE in the last 168 hours. No results for input(s): AMMONIA in the last 168 hours. CBC: Recent Labs  Lab 01/25/19 1408 01/26/19 0358  WBC 9.1 4.6  NEUTROABS 6.1  --   HGB 13.6 13.1  HCT 41.6 40.9  MCV 87.2 87.6  PLT 189 165   Cardiac Enzymes: No results for input(s): CKTOTAL, CKMB, CKMBINDEX, TROPONINI in the last 168 hours. BNP: Invalid input(s): POCBNP CBG: Recent Labs  Lab 01/25/19 1850 01/25/19 2123 01/26/19 0340 01/26/19 0806  GLUCAP 230* 293* 319* 264*     IMAGING RESULTS:  Imaging: Dg Chest Port 1 View  Result Date: 01/25/2019 CLINICAL DATA:  Fever. Shortness of breath. EXAM: PORTABLE CHEST 1 VIEW COMPARISON:  10/13/2018 FINDINGS: There is cardiomegaly with pulmonary vascular congestion. There is haziness in the left perihilar region and in the right  upper and lower lobes, probably representing mild pulmonary edema or possibly faint pneumonia. No discrete pleural effusions. No acute bone abnormality. Prominent degenerative changes of the right shoulder. IMPRESSION: 1. Hazy infiltrates in the left perihilar region and in the right upper and lower lobes. 2. Cardiomegaly with pulmonary vascular congestion. The hazy infiltrates could represent pulmonary edema or pneumonia. Electronically Signed   By: Lorriane Shire M.D.   On: 01/25/2019 14:46   Dg Knee Complete 4 Views Left  Result Date: 01/25/2019 CLINICAL DATA:  Fever. Soft tissue wounds on the left BKA stump. EXAM: LEFT KNEE - COMPLETE 4+ VIEW COMPARISON:  Radiographs dated 12/25/2017 FINDINGS: Surgical margins of the fibula and tibia are sharp with no radiographic evidence suggestive of osteomyelitis. No significant arthritic changes of the knee. Vascular stent in the distal left thigh. IMPRESSION: No significant abnormality. Specifically, no evidence of osteomyelitis in the tibia or fibula. Electronically Signed   By: Lorriane Shire M.D.   On: 01/25/2019 14:48      ASSESSMENT AND PLAN    -Multidisciplinary rounds held today  Acute Hypoxic Respiratory Failure -due to COVID19 infection -Currently on RA -  was noted to be desaturating to mid 80s intermittently -bronchopulmonary hygiene with IS at bedside and chest physiotherapy via bed -continue Bronchodilator Therapy -possible intercurrent acute exacerbation of COPD - continue dulera    COVID 19 associated pneumonia -CXR as above with bilateral infiltrates -Remdesevir - pharmacy on case - appreciate collaboration  - Actemra - will consider if appropriate per hospital policy- currently CRP is <10 -Vitamin C-250mg  bid -Dexamethasone - 6mg  iv daily      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 -d/c nonessential nephrotoxins  -follow chem 7 -follow UO -continue Foley Catheter-assess need daily    OSA    - may use home CPAP device QHS    ID -continue IV abx as prescibed -follow up cultures   GI/Nutrition GI PROPHYLAXIS as indicated DIET-->TF's as tolerated Constipation protocol as indicated   ENDO - ICU hypoglycemic\Hyperglycemia protocol -check FSBS per protocol   ELECTROLYTES -follow labs as needed -replace as needed -pharmacy consultation   DVT/GI PRX ordered -SCDs -eliquis 2.5 bid  TRANSFUSIONS AS NEEDED MONITOR FSBS ASSESS the need for LABS as needed   Critical care provider statement:    Critical care time (minutes):  32   Critical care time was exclusive of:  Separately billable procedures and treating other patients   Critical care was necessary to treat or prevent imminent or life-threatening deterioration of the following conditions:  acute hypoxic respiratory failure, COVID 19 pneumonia, multiple comorbid conditions   Critical care was time spent personally by me on the following activities:  Development of treatment plan with patient or surrogate, discussions with consultants, evaluation of patient's response to treatment, examination of patient, obtaining history from patient or surrogate, ordering and performing treatments and interventions, ordering and review of laboratory studies and re-evaluation of patient's condition.  I assumed direction of critical care for this patient from another provider in my specialty: no    This document was prepared using Dragon voice recognition software and may include unintentional dictation errors.    Ottie Glazier, M.D.  Division of [place]

## 2019-01-26 NOTE — Progress Notes (Signed)
eLink Physician-Brief Progress Note Patient Name: [name]. DOB: [birth year] MRN: [id number]   Date of Service  01/26/2019  HPI/Events of Note  Pt admitted to step down unit with  Covid 19 +, pneumonia, and infected BKA wound stump. Per Shawn Conger NP critical care  Not formally consulted at this point, but standing by in case of deterioration.  eICU Interventions  New patient evaluation completed.        Kerry Kass Kayton Dunaj 01/26/2019, 3:44 AM

## 2019-01-26 NOTE — Progress Notes (Signed)
Patient ID: [name]., male   DOB: 01-20-1932, 85 y.o.   MRN: [id number]  Patient seen by critical care specialist today, I will hold off on rounding on the patient today.  I reviewed the laboratory data and notes.  Patient is COVID-19 positive and on wait list to go to [facility] facility.  Dr Loletha Grayer

## 2019-01-26 NOTE — Care Plan (Addendum)
Pt with mild hypoxia after arrival to ICU, with O2 sats 90 to 91% on room air.  Pt's CXR at admission concerning for possible pneumonia, therefore per COVID-19 Protocol (and to try and avoid deterioration), will place pt on IV Steroids and Remdesivir.  Pt currently remains hemodynamically stable, PCCM will follow from a distance while in Stepdown unit (actually here as Telemetry overflow), and is available for any critical care needs. Discussed with Hospitalist Dr. Marcille Blanco, and he is in agreement with current plan.     Darel Hong, AGACNP-BC [facility] Pager: [phone] Cell: [phone]

## 2019-01-26 NOTE — Care Plan (Signed)
Mr. [name] is an 85 year old male with a past medical history notable for CAD, atrial fibrillation on Eliquis, HFpEF, COPD, CVA, diabetes mellitus, peripheral artery disease status post left BKA who presented to the ED on 01/25/2019 with complaints of fevers (101.9).  He also reported some shortness of breath and occasional cough productive of sputum over the past couple days, along with ulceration of the right heel and left BKA stump.  He was admitted to [facility] unit by hospitalist for further work-up and treatment of sepsis secondary to multifocal pneumonia and infected left BKA stump.  His COVID-19 PCR has come back positive.  He is currently on room air and hemodynamically stable.  By policy he is supposed to be transferred to telemetry unit (designated COVID unit), however there are no beds available currently.  Subsequently he is being transferred to stepdown unit as a telemetry overflow patient due to bed availability.  PCCM is not officially consulted, but is available as needed for any critical care needs.    Darel Hong, AGACNP-BC [facility] Pager: [phone] Cell: [phone]

## 2019-01-27 ENCOUNTER — Inpatient Hospital Stay (HOSPITAL_COMMUNITY)
Admission: AD | Admit: 2019-01-27 | Discharge: 2019-02-11 | DRG: 208 | Disposition: E | Payer: Medicare Other | Source: Other Acute Inpatient Hospital | Attending: Internal Medicine | Admitting: Internal Medicine

## 2019-01-27 ENCOUNTER — Other Ambulatory Visit: Payer: Self-pay

## 2019-01-27 ENCOUNTER — Encounter (HOSPITAL_COMMUNITY): Payer: Self-pay

## 2019-01-27 DIAGNOSIS — U071 COVID-19: Principal | ICD-10-CM | POA: Diagnosis present

## 2019-01-27 DIAGNOSIS — I130 Hypertensive heart and chronic kidney disease with heart failure and stage 1 through stage 4 chronic kidney disease, or unspecified chronic kidney disease: Secondary | ICD-10-CM | POA: Diagnosis present

## 2019-01-27 DIAGNOSIS — E1165 Type 2 diabetes mellitus with hyperglycemia: Secondary | ICD-10-CM | POA: Diagnosis present

## 2019-01-27 DIAGNOSIS — Z8249 Family history of ischemic heart disease and other diseases of the circulatory system: Secondary | ICD-10-CM

## 2019-01-27 DIAGNOSIS — I5032 Chronic diastolic (congestive) heart failure: Secondary | ICD-10-CM | POA: Diagnosis not present

## 2019-01-27 DIAGNOSIS — E1122 Type 2 diabetes mellitus with diabetic chronic kidney disease: Secondary | ICD-10-CM | POA: Diagnosis present

## 2019-01-27 DIAGNOSIS — J440 Chronic obstructive pulmonary disease with acute lower respiratory infection: Secondary | ICD-10-CM | POA: Diagnosis present

## 2019-01-27 DIAGNOSIS — E1140 Type 2 diabetes mellitus with diabetic neuropathy, unspecified: Secondary | ICD-10-CM | POA: Diagnosis not present

## 2019-01-27 DIAGNOSIS — R579 Shock, unspecified: Secondary | ICD-10-CM | POA: Diagnosis not present

## 2019-01-27 DIAGNOSIS — K219 Gastro-esophageal reflux disease without esophagitis: Secondary | ICD-10-CM | POA: Diagnosis present

## 2019-01-27 DIAGNOSIS — R54 Age-related physical debility: Secondary | ICD-10-CM | POA: Diagnosis present

## 2019-01-27 DIAGNOSIS — Z888 Allergy status to other drugs, medicaments and biological substances status: Secondary | ICD-10-CM

## 2019-01-27 DIAGNOSIS — I462 Cardiac arrest due to underlying cardiac condition: Secondary | ICD-10-CM | POA: Diagnosis not present

## 2019-01-27 DIAGNOSIS — R0602 Shortness of breath: Secondary | ICD-10-CM | POA: Diagnosis not present

## 2019-01-27 DIAGNOSIS — T8789 Other complications of amputation stump: Secondary | ICD-10-CM | POA: Diagnosis present

## 2019-01-27 DIAGNOSIS — I10 Essential (primary) hypertension: Secondary | ICD-10-CM | POA: Diagnosis not present

## 2019-01-27 DIAGNOSIS — L8990 Pressure ulcer of unspecified site, unspecified stage: Secondary | ICD-10-CM | POA: Insufficient documentation

## 2019-01-27 DIAGNOSIS — Z79890 Hormone replacement therapy (postmenopausal): Secondary | ICD-10-CM

## 2019-01-27 DIAGNOSIS — Z9981 Dependence on supplemental oxygen: Secondary | ICD-10-CM

## 2019-01-27 DIAGNOSIS — J1282 Pneumonia due to coronavirus disease 2019: Secondary | ICD-10-CM

## 2019-01-27 DIAGNOSIS — E870 Hyperosmolality and hypernatremia: Secondary | ICD-10-CM | POA: Diagnosis present

## 2019-01-27 DIAGNOSIS — N179 Acute kidney failure, unspecified: Secondary | ICD-10-CM | POA: Diagnosis present

## 2019-01-27 DIAGNOSIS — I5033 Acute on chronic diastolic (congestive) heart failure: Secondary | ICD-10-CM | POA: Diagnosis not present

## 2019-01-27 DIAGNOSIS — I2510 Atherosclerotic heart disease of native coronary artery without angina pectoris: Secondary | ICD-10-CM | POA: Diagnosis present

## 2019-01-27 DIAGNOSIS — R0603 Acute respiratory distress: Secondary | ICD-10-CM

## 2019-01-27 DIAGNOSIS — E11649 Type 2 diabetes mellitus with hypoglycemia without coma: Secondary | ICD-10-CM | POA: Diagnosis not present

## 2019-01-27 DIAGNOSIS — N183 Chronic kidney disease, stage 3 unspecified: Secondary | ICD-10-CM | POA: Diagnosis not present

## 2019-01-27 DIAGNOSIS — Z7901 Long term (current) use of anticoagulants: Secondary | ICD-10-CM

## 2019-01-27 DIAGNOSIS — E1151 Type 2 diabetes mellitus with diabetic peripheral angiopathy without gangrene: Secondary | ICD-10-CM | POA: Diagnosis present

## 2019-01-27 DIAGNOSIS — J9621 Acute and chronic respiratory failure with hypoxia: Secondary | ICD-10-CM | POA: Diagnosis present

## 2019-01-27 DIAGNOSIS — L89151 Pressure ulcer of sacral region, stage 1: Secondary | ICD-10-CM | POA: Diagnosis present

## 2019-01-27 DIAGNOSIS — Z789 Other specified health status: Secondary | ICD-10-CM

## 2019-01-27 DIAGNOSIS — Z01818 Encounter for other preprocedural examination: Secondary | ICD-10-CM

## 2019-01-27 DIAGNOSIS — G9340 Encephalopathy, unspecified: Secondary | ICD-10-CM | POA: Diagnosis not present

## 2019-01-27 DIAGNOSIS — J9601 Acute respiratory failure with hypoxia: Secondary | ICD-10-CM | POA: Diagnosis not present

## 2019-01-27 DIAGNOSIS — B965 Pseudomonas (aeruginosa) (mallei) (pseudomallei) as the cause of diseases classified elsewhere: Secondary | ICD-10-CM | POA: Diagnosis not present

## 2019-01-27 DIAGNOSIS — Z9289 Personal history of other medical treatment: Secondary | ICD-10-CM

## 2019-01-27 DIAGNOSIS — Z7189 Other specified counseling: Secondary | ICD-10-CM | POA: Diagnosis not present

## 2019-01-27 DIAGNOSIS — Z794 Long term (current) use of insulin: Secondary | ICD-10-CM

## 2019-01-27 DIAGNOSIS — L89610 Pressure ulcer of right heel, unstageable: Secondary | ICD-10-CM | POA: Diagnosis present

## 2019-01-27 DIAGNOSIS — A4189 Other specified sepsis: Secondary | ICD-10-CM | POA: Diagnosis not present

## 2019-01-27 DIAGNOSIS — I482 Chronic atrial fibrillation, unspecified: Secondary | ICD-10-CM | POA: Diagnosis present

## 2019-01-27 DIAGNOSIS — Z8673 Personal history of transient ischemic attack (TIA), and cerebral infarction without residual deficits: Secondary | ICD-10-CM

## 2019-01-27 DIAGNOSIS — R570 Cardiogenic shock: Secondary | ICD-10-CM | POA: Diagnosis not present

## 2019-01-27 DIAGNOSIS — E785 Hyperlipidemia, unspecified: Secondary | ICD-10-CM | POA: Diagnosis present

## 2019-01-27 DIAGNOSIS — J80 Acute respiratory distress syndrome: Secondary | ICD-10-CM | POA: Diagnosis not present

## 2019-01-27 DIAGNOSIS — R509 Fever, unspecified: Secondary | ICD-10-CM | POA: Diagnosis not present

## 2019-01-27 DIAGNOSIS — E1142 Type 2 diabetes mellitus with diabetic polyneuropathy: Secondary | ICD-10-CM | POA: Diagnosis present

## 2019-01-27 DIAGNOSIS — J441 Chronic obstructive pulmonary disease with (acute) exacerbation: Secondary | ICD-10-CM | POA: Diagnosis not present

## 2019-01-27 DIAGNOSIS — Z87891 Personal history of nicotine dependence: Secondary | ICD-10-CM

## 2019-01-27 DIAGNOSIS — Z91041 Radiographic dye allergy status: Secondary | ICD-10-CM

## 2019-01-27 DIAGNOSIS — Z951 Presence of aortocoronary bypass graft: Secondary | ICD-10-CM

## 2019-01-27 DIAGNOSIS — Z515 Encounter for palliative care: Secondary | ICD-10-CM | POA: Diagnosis not present

## 2019-01-27 DIAGNOSIS — T380X5A Adverse effect of glucocorticoids and synthetic analogues, initial encounter: Secondary | ICD-10-CM | POA: Diagnosis not present

## 2019-01-27 DIAGNOSIS — E039 Hypothyroidism, unspecified: Secondary | ICD-10-CM | POA: Diagnosis present

## 2019-01-27 DIAGNOSIS — Z87442 Personal history of urinary calculi: Secondary | ICD-10-CM

## 2019-01-27 DIAGNOSIS — Z833 Family history of diabetes mellitus: Secondary | ICD-10-CM

## 2019-01-27 DIAGNOSIS — R7881 Bacteremia: Secondary | ICD-10-CM | POA: Diagnosis not present

## 2019-01-27 DIAGNOSIS — Z79899 Other long term (current) drug therapy: Secondary | ICD-10-CM

## 2019-01-27 DIAGNOSIS — G4733 Obstructive sleep apnea (adult) (pediatric): Secondary | ICD-10-CM | POA: Diagnosis present

## 2019-01-27 DIAGNOSIS — E118 Type 2 diabetes mellitus with unspecified complications: Secondary | ICD-10-CM | POA: Diagnosis not present

## 2019-01-27 DIAGNOSIS — R0600 Dyspnea, unspecified: Secondary | ICD-10-CM

## 2019-01-27 DIAGNOSIS — J1289 Other viral pneumonia: Secondary | ICD-10-CM | POA: Diagnosis present

## 2019-01-27 DIAGNOSIS — Z885 Allergy status to narcotic agent status: Secondary | ICD-10-CM

## 2019-01-27 DIAGNOSIS — Z89512 Acquired absence of left leg below knee: Secondary | ICD-10-CM

## 2019-01-27 DIAGNOSIS — E872 Acidosis: Secondary | ICD-10-CM | POA: Diagnosis not present

## 2019-01-27 LAB — BASIC METABOLIC PANEL
Anion gap: 10 (ref 5–15)
BUN: 54 mg/dL — ABNORMAL HIGH (ref 8–23)
CO2: 28 mmol/L (ref 22–32)
Calcium: 8.9 mg/dL (ref 8.9–10.3)
Chloride: 106 mmol/L (ref 98–111)
Creatinine, Ser: 1.5 mg/dL — ABNORMAL HIGH (ref 0.61–1.24)
GFR calc Af Amer: 49 mL/min — ABNORMAL LOW (ref >60)
GFR calc non Af Amer: 42 mL/min — ABNORMAL LOW (ref >60)
Glucose, Bld: 90 mg/dL (ref 70–99)
Potassium: 3.7 mmol/L (ref 3.5–5.1)
Sodium: 144 mmol/L (ref 135–145)

## 2019-01-27 LAB — PHOSPHORUS: Phosphorus: 3.4 mg/dL (ref 2.5–4.6)

## 2019-01-27 LAB — GLUCOSE, CAPILLARY
Glucose-Capillary: 89 mg/dL (ref 70–99)
Glucose-Capillary: 299 mg/dL — ABNORMAL HIGH (ref 70–99)
Glucose-Capillary: 271 mg/dL — ABNORMAL HIGH (ref 70–99)

## 2019-01-27 LAB — CBC
HCT: 41.9 % (ref 39.0–52.0)
Hemoglobin: 13.2 g/dL (ref 13.0–17.0)
MCH: 27.8 pg (ref 26.0–34.0)
MCHC: 31.5 g/dL (ref 30.0–36.0)
MCV: 88.4 fL (ref 80.0–100.0)
Platelets: 173 10*3/uL (ref 150–400)
RBC: 4.74 MIL/uL (ref 4.22–5.81)
RDW: 15.9 % — ABNORMAL HIGH (ref 11.5–15.5)
WBC: 9.8 10*3/uL (ref 4.0–10.5)
nRBC: 0 % (ref 0.0–0.2)

## 2019-01-27 LAB — ANGIOTENSIN CONVERTING ENZYME: Angiotensin-Converting Enzyme: 21 U/L (ref 14–82)

## 2019-01-27 LAB — MAGNESIUM: Magnesium: 2.2 mg/dL (ref 1.7–2.4)

## 2019-01-27 LAB — PROCALCITONIN: Procalcitonin: <0.1 ng/mL

## 2019-01-27 LAB — TYPE AND SCREEN
ABO/RH(D): A POS
Antibody Screen: NEGATIVE

## 2019-01-27 MED ORDER — TRAZODONE HCL 50 MG PO TABS
100.0000 mg | ORAL_TABLET | Freq: Every day | ORAL | Status: DC
  Administered 2019-01-27 – 2019-02-08 (×11): 100 mg via ORAL
  Filled 2019-01-27 (×11): qty 2

## 2019-01-27 MED ORDER — FINASTERIDE 5 MG PO TABS
5.0000 mg | ORAL_TABLET | Freq: Every day | ORAL | Status: DC
  Administered 2019-01-27 – 2019-02-09 (×13): 5 mg via ORAL
  Filled 2019-01-27 (×15): qty 1

## 2019-01-27 MED ORDER — GABAPENTIN 100 MG PO CAPS
100.0000 mg | ORAL_CAPSULE | Freq: Three times a day (TID) | ORAL | Status: DC
  Administered 2019-01-27 – 2019-01-31 (×12): 100 mg via ORAL
  Filled 2019-01-27 (×17): qty 1

## 2019-01-27 MED ORDER — LATANOPROST 0.005 % OP SOLN
1.0000 [drp] | Freq: Every day | OPHTHALMIC | Status: DC
  Administered 2019-01-27 – 2019-02-08 (×13): 1 [drp] via OPHTHALMIC
  Filled 2019-01-27: qty 2.5

## 2019-01-27 MED ORDER — VITAMIN C 500 MG PO TABS
250.0000 mg | ORAL_TABLET | Freq: Two times a day (BID) | ORAL | Status: DC
  Administered 2019-01-27 – 2019-01-31 (×8): 250 mg via ORAL
  Filled 2019-01-27 (×8): qty 1

## 2019-01-27 MED ORDER — METOPROLOL SUCCINATE ER 25 MG PO TB24
50.0000 mg | ORAL_TABLET | Freq: Two times a day (BID) | ORAL | Status: DC
  Administered 2019-01-27: 50 mg via ORAL
  Filled 2019-01-27 (×2): qty 2

## 2019-01-27 MED ORDER — COLLAGENASE 250 UNIT/GM EX OINT
TOPICAL_OINTMENT | Freq: Every day | CUTANEOUS | Status: DC
  Administered 2019-01-27 – 2019-01-30 (×4): via TOPICAL
  Administered 2019-01-31: 1 via TOPICAL
  Administered 2019-02-01 – 2019-02-02 (×2): via TOPICAL
  Administered 2019-02-03: 1 via TOPICAL
  Administered 2019-02-04 – 2019-02-09 (×6): via TOPICAL
  Filled 2019-01-27: qty 30

## 2019-01-27 MED ORDER — LEVOTHYROXINE SODIUM 50 MCG PO TABS
50.0000 ug | ORAL_TABLET | Freq: Every day | ORAL | Status: DC
  Administered 2019-01-28 – 2019-01-31 (×4): 50 ug via ORAL
  Filled 2019-01-27 (×5): qty 1

## 2019-01-27 MED ORDER — SODIUM CHLORIDE 0.9% FLUSH
3.0000 mL | Freq: Two times a day (BID) | INTRAVENOUS | Status: DC
  Administered 2019-01-27 – 2019-02-09 (×26): 3 mL via INTRAVENOUS

## 2019-01-27 MED ORDER — APIXABAN 2.5 MG PO TABS
2.5000 mg | ORAL_TABLET | Freq: Two times a day (BID) | ORAL | Status: DC
  Administered 2019-01-27 – 2019-01-28 (×2): 2.5 mg via ORAL
  Filled 2019-01-27 (×2): qty 1

## 2019-01-27 MED ORDER — MAGNESIUM CITRATE PO SOLN: 1.0000 | Freq: Once | ORAL | Status: DC | PRN

## 2019-01-27 MED ORDER — INSULIN ASPART 100 UNIT/ML ~~LOC~~ SOLN
3.0000 [IU] | Freq: Three times a day (TID) | SUBCUTANEOUS | Status: DC
  Administered 2019-01-27 – 2019-01-29 (×5): 3 [IU] via SUBCUTANEOUS

## 2019-01-27 MED ORDER — TAMSULOSIN HCL 0.4 MG PO CAPS
0.4000 mg | ORAL_CAPSULE | Freq: Every day | ORAL | Status: DC
  Administered 2019-01-27 – 2019-02-09 (×12): 0.4 mg via ORAL
  Filled 2019-01-27 (×15): qty 1

## 2019-01-27 MED ORDER — INSULIN GLARGINE 100 UNIT/ML ~~LOC~~ SOLN
1.0000 [IU] | Freq: Two times a day (BID) | SUBCUTANEOUS | Status: DC
  Filled 2019-01-27 (×2): qty 0.01

## 2019-01-27 MED ORDER — ALPRAZOLAM 0.5 MG PO TABS: 0.2500 mg | ORAL_TABLET | Freq: Every evening | ORAL | Status: DC | PRN

## 2019-01-27 MED ORDER — METHYLPREDNISOLONE SODIUM SUCC 125 MG IJ SOLR
60.0000 mg | Freq: Two times a day (BID) | INTRAMUSCULAR | Status: DC
  Administered 2019-01-27 (×2): 60 mg via INTRAVENOUS
  Filled 2019-01-27 (×3): qty 2

## 2019-01-27 MED ORDER — EZETIMIBE 10 MG PO TABS
10.0000 mg | ORAL_TABLET | Freq: Every day | ORAL | Status: DC
  Administered 2019-01-27 – 2019-01-31 (×5): 10 mg via ORAL
  Filled 2019-01-27 (×6): qty 1

## 2019-01-27 MED ORDER — METOPROLOL TARTRATE 5 MG/5ML IV SOLN
5.0000 mg | Freq: Four times a day (QID) | INTRAVENOUS | Status: DC | PRN
  Administered 2019-02-03 – 2019-02-06 (×3): 5 mg via INTRAVENOUS
  Filled 2019-01-27 (×4): qty 5

## 2019-01-27 MED ORDER — SODIUM CHLORIDE 0.9 % IV SOLN
100.0000 mg | INTRAVENOUS | Status: AC
  Administered 2019-01-27: 100 mg via INTRAVENOUS
  Filled 2019-01-27: qty 20

## 2019-01-27 MED ORDER — DORZOLAMIDE HCL 2 % OP SOLN
1.0000 [drp] | Freq: Three times a day (TID) | OPHTHALMIC | Status: DC
  Administered 2019-01-27 – 2019-02-09 (×41): 1 [drp] via OPHTHALMIC
  Filled 2019-01-27: qty 10

## 2019-01-27 MED ORDER — INSULIN GLARGINE 100 UNIT/ML ~~LOC~~ SOLN
30.0000 [IU] | Freq: Two times a day (BID) | SUBCUTANEOUS | Status: DC
  Administered 2019-01-27 – 2019-01-28 (×4): 30 [IU] via SUBCUTANEOUS
  Filled 2019-01-27 (×5): qty 0.3

## 2019-01-27 MED ORDER — CHLORHEXIDINE GLUCONATE 0.12 % MT SOLN
15.0000 mL | Freq: Two times a day (BID) | OROMUCOSAL | Status: DC
  Administered 2019-01-27 – 2019-02-01 (×11): 15 mL via OROMUCOSAL
  Filled 2019-01-27 (×11): qty 15

## 2019-01-27 MED ORDER — CITALOPRAM HYDROBROMIDE 10 MG PO TABS
10.0000 mg | ORAL_TABLET | Freq: Every day | ORAL | Status: DC
  Administered 2019-01-27 – 2019-01-31 (×5): 10 mg via ORAL
  Filled 2019-01-27 (×6): qty 1

## 2019-01-27 MED ORDER — ONDANSETRON HCL 4 MG PO TABS: 4.0000 mg | ORAL_TABLET | Freq: Four times a day (QID) | ORAL | Status: DC | PRN

## 2019-01-27 MED ORDER — NITROGLYCERIN 0.4 MG SL SUBL: 0.4000 mg | SUBLINGUAL_TABLET | SUBLINGUAL | Status: DC | PRN

## 2019-01-27 MED ORDER — ONDANSETRON HCL 4 MG/2ML IJ SOLN: 4.0000 mg | Freq: Four times a day (QID) | INTRAMUSCULAR | Status: DC | PRN

## 2019-01-27 MED ORDER — DOCUSATE SODIUM 100 MG PO CAPS
100.0000 mg | ORAL_CAPSULE | Freq: Two times a day (BID) | ORAL | Status: DC
  Administered 2019-01-27 – 2019-01-31 (×8): 100 mg via ORAL
  Filled 2019-01-27 (×9): qty 1

## 2019-01-27 MED ORDER — ATORVASTATIN CALCIUM 40 MG PO TABS
40.0000 mg | ORAL_TABLET | Freq: Every day | ORAL | Status: DC
  Administered 2019-01-27 – 2019-01-30 (×4): 40 mg via ORAL
  Filled 2019-01-27 (×4): qty 1

## 2019-01-27 MED ORDER — SODIUM CHLORIDE 0.9 % IV SOLN
100.0000 mg | INTRAVENOUS | Status: AC
  Administered 2019-01-28 – 2019-01-30 (×3): 100 mg via INTRAVENOUS
  Filled 2019-01-27 (×3): qty 20

## 2019-01-27 MED ORDER — INSULIN ASPART 100 UNIT/ML ~~LOC~~ SOLN
0.0000 [IU] | Freq: Three times a day (TID) | SUBCUTANEOUS | Status: DC
  Administered 2019-01-27: 2 [IU] via SUBCUTANEOUS
  Administered 2019-01-27 – 2019-01-28 (×2): 8 [IU] via SUBCUTANEOUS
  Administered 2019-01-28: 11 [IU] via SUBCUTANEOUS
  Administered 2019-01-28: 5 [IU] via SUBCUTANEOUS
  Administered 2019-01-29: 3 [IU] via SUBCUTANEOUS
  Administered 2019-01-29: 2 [IU] via SUBCUTANEOUS
  Administered 2019-01-30: 15 [IU] via SUBCUTANEOUS
  Administered 2019-01-30: 11 [IU] via SUBCUTANEOUS
  Administered 2019-01-30 – 2019-01-31 (×2): 5 [IU] via SUBCUTANEOUS

## 2019-01-27 MED ORDER — ORAL CARE MOUTH RINSE
15.0000 mL | Freq: Two times a day (BID) | OROMUCOSAL | Status: DC
  Administered 2019-01-27 – 2019-01-31 (×8): 15 mL via OROMUCOSAL

## 2019-01-27 MED ORDER — INSULIN ASPART 100 UNIT/ML ~~LOC~~ SOLN
0.0000 [IU] | Freq: Every day | SUBCUTANEOUS | Status: DC
  Administered 2019-01-27: 21:00:00 3 [IU] via SUBCUTANEOUS
  Administered 2019-01-28: 2 [IU] via SUBCUTANEOUS
  Administered 2019-01-29: 5 [IU] via SUBCUTANEOUS
  Administered 2019-01-30: 3 [IU] via SUBCUTANEOUS

## 2019-01-27 MED ORDER — ENOXAPARIN SODIUM 40 MG/0.4ML ~~LOC~~ SOLN: 40.0000 mg | SUBCUTANEOUS | Status: DC

## 2019-01-27 MED ORDER — POTASSIUM CHLORIDE CRYS ER 20 MEQ PO TBCR
40.0000 meq | EXTENDED_RELEASE_TABLET | Freq: Once | ORAL | Status: DC
  Filled 2019-01-27: qty 2

## 2019-01-27 MED ORDER — ACETAMINOPHEN 325 MG PO TABS
650.0000 mg | ORAL_TABLET | Freq: Four times a day (QID) | ORAL | Status: DC | PRN
  Administered 2019-02-06 – 2019-02-09 (×5): 650 mg via ORAL
  Filled 2019-01-27 (×5): qty 2

## 2019-01-27 NOTE — Progress Notes (Signed)
Pt transferred to [facility] with no complications via carelink. Pts daughter called and notified of transfer. Attempted to call report to receiving RN but RN was unable to take report at that time. Awaiting a return call.

## 2019-01-27 NOTE — H&P (Addendum)
TRH H&P   Patient Demographics:    [name], is a 85 y.o. male  MRN: [id number]   DOB - Aug 26, 1931  Admit Date - 01/30/2019  Outpatient Primary MD for the patient is Jearld Fenton, NP  Patient coming from: [place]    HPI:    [name]  is a 85 y.o. male,  with a known history of CAD, Chronic atrial fibrillation on Eliquis, Chronic diastolic CHF EF 51% in 1021, COPD on PRN 02 at home, CVA x 2, DM 2 and PAD status post left BKA in June 2020 who presented to the emergency room with complaints of fevers and was admitted for CAP at [facility], later Covid 19 came back +ve and was sent to [facility]. Currently mild SOB on 2 lits o2, no other complaints.   Review of systems:     A full 10 point Review of Systems was done, except as stated above, all other Review of Systems were negative.  With Past History of the following :    Past Medical History:  Diagnosis Date  . Arthritis   . CAD    a. MI 01/29/1996 tx'd w/ TPA @ [place]; b. Myoview 06/2005: EF 50%, scar @ apex, mild peri-infarct ischemia  . Cancer ([place])    skin  . Chronic atrial fibrillation ([place])    a. since 2006; b. on warfarin  . Chronic diastolic CHF (congestive heart failure) ([place])    a. echo 04/2006: EF lower limits of nl, mod LVH, mild aortic root dilatation, & mild MR, biatrial enlargement; b. echo 04/2013: EF 60%, mod dilated LA, mild MR & TR, mod pulm HTN w/ RV systolic pressure 53, c. echo 04/21/14: EF 55-60%, unable to exclude WMA, severely dilated LA 6.6 cm, nl RVSP, mildly dilated aortic root  . COPD (chronic obstructive pulmonary disease) (HCC)    oxygen prn at home  . CVA 1173,5670   x2  . DM   . Falls   . GERD  (gastroesophageal reflux disease)   . History of kidney stones   . HYPERLIPIDEMIA   . HYPERTENSION   . Kidney stone    a. s/p left ureteral stenting 04/24/14  . Left arm weakness    limited movement. S/P fall injury  . Neuropathy of both feet   . Poor  balance   . Wears dentures    full upper and lower      Past Surgical History:  Procedure Laterality Date  . AMPUTATION Left 10/06/2018   Procedure: AMPUTATION BELOW KNEE;  Surgeon: Algernon Huxley, MD;  Location: [facility];  Service: General;  Laterality: Left;  . AMPUTATION TOE Left 06/13/2018   Procedure: 1st Ray Resection Left;  Surgeon: Sharlotte Alamo, DPM;  Location: [facility];  Service: Podiatry;  Laterality: Left;  . BLADDER SURGERY     stent placement   . CARDIAC CATHETERIZATION  1997   [facility]  . CAROTID STENT INSERTION  1997  . CATARACT EXTRACTION W/PHACO Left 10/11/2017   Procedure: CATARACT EXTRACTION PHACO AND INTRAOCULAR LENS PLACEMENT ([place]) COMPLICATED LEFT DIABETIC;  Surgeon: Leandrew Koyanagi, MD;  Location: [place];  Service: Ophthalmology;  Laterality: Left;  MALYUGIN Diabetic - insulin  . CATARACT EXTRACTION W/PHACO Right 11/30/2017   Procedure: CATARACT EXTRACTION PHACO AND INTRAOCULAR LENS PLACEMENT ([place]) COMPLICATED  RIGHT DIABETIC;  Surgeon: Leandrew Koyanagi, MD;  Location: [place];  Service: Ophthalmology;  Laterality: Right;  Diabetic - insulin  . CIRCUMCISION  2016  . CORONARY ANGIOPLASTY  1997   s/p stent placement x 2   . CYSTOSCOPY W/ URETERAL STENT REMOVAL Left 10/09/2014   Procedure: CYSTOSCOPY WITH STENT REMOVAL;  Surgeon: Hollice Espy, MD;  Location: [facility];  Service: Urology;  Laterality: Left;  . CYSTOSCOPY WITH STENT PLACEMENT Left 10/09/2014   Procedure: CYSTOSCOPY WITH STENT PLACEMENT;  Surgeon: Hollice Espy, MD;  Location: [facility];  Service: Urology;  Laterality: Left;  . IRRIGATION AND DEBRIDEMENT FOOT Left 09/15/2018   Procedure: IRRIGATION AND DEBRIDEMENT FOOT DELAY  CLOSURE;  Surgeon: Samara Deist, DPM;  Location: [facility];  Service: Podiatry;  Laterality: Left;  . KIDNEY SURGERY  05/2013   s/p stent placement   . LOWER EXTREMITY ANGIOGRAPHY Left 06/12/2018   Procedure: Lower Extremity Angiography;  Surgeon: Algernon Huxley, MD;  Location: [place] [facility];  Service: Cardiovascular;  Laterality: Left;  . LOWER EXTREMITY ANGIOGRAPHY Left 09/13/2018   Procedure: Lower Extremity Angiography;  Surgeon: Algernon Huxley, MD;  Location: [place] [facility];  Service: Cardiovascular;  Laterality: Left;  . stents ureters Bilateral   . TONSILLECTOMY AND ADENOIDECTOMY  1959  . URETEROSCOPY WITH HOLMIUM LASER LITHOTRIPSY Left 10/09/2014   Procedure: URETEROSCOPY WITH HOLMIUM LASER LITHOTRIPSY;  Surgeon: Hollice Espy, MD;  Location: [facility];  Service: Urology;  Laterality: Left;      Social History:     Social History   Tobacco Use  . Smoking status: Former Smoker    Packs/day: 2.00    Years: 40.00    Pack years: 80.00    Types: Cigarettes    Quit date: 05/24/1990    Years since quitting: 28.6  . Smokeless tobacco: Former Systems developer    Quit date: 05/24/1990  Substance Use Topics  . Alcohol use: No         Family History :     Family History  Problem Relation Age of Onset  . Heart disease Mother   . Diabetes Mother   . Heart disease Maternal Grandmother   . Diabetes Maternal Grandmother   . Cancer Neg Hx   . Stroke Neg Hx        Home Medications:   Prior to Admission medications   Medication Sig Start Date End Date Taking? Authorizing Provider  ALPRAZolam (XANAX) 0.25 MG tablet Take 1 tablet (0.25 mg total) by mouth at  bedtime as needed for anxiety. 11/20/18   Jearld Fenton, NP  atorvastatin (LIPITOR) 40 MG tablet TAKE 1 TABLET DAILY Patient taking differently: Take 40 mg by mouth daily at 6 PM.  01/12/19   Baity, Coralie Keens, NP  citalopram (CELEXA) 10 MG tablet TAKE 1 TABLET DAILY Patient taking differently: Take 10 mg by mouth daily.  12/28/18    Jearld Fenton, NP  docusate sodium (COLACE) 100 MG capsule Take 1 capsule (100 mg total) by mouth 2 (two) times daily. 10/10/18   Hillary Bow, MD  dorzolamide (TRUSOPT) 2 % ophthalmic solution Place 1 drop into both eyes 3 (three) times daily.  03/31/16   [provider]  ezetimibe (ZETIA) 10 MG tablet TAKE 1 TABLET DAILY 01/12/19   Jearld Fenton, NP  finasteride (PROSCAR) 5 MG tablet TAKE 1 TABLET DAILY 11/09/18   Jearld Fenton, NP  gabapentin (NEURONTIN) 100 MG capsule TAKE 1 CAPSULE THREE TIMES A DAY 11/19/18   Baity, Coralie Keens, NP  latanoprost (XALATAN) 0.005 % ophthalmic solution Place 1 drop into both eyes at bedtime.  04/18/16   [provider]  levothyroxine (SYNTHROID) 50 MCG tablet TAKE 1 TABLET DAILY BEFORE BREAKFAST Patient taking differently: Take 50 mcg by mouth daily before breakfast.  12/25/18   Jearld Fenton, NP  metoprolol succinate (TOPROL-XL) 50 MG 24 hr tablet TAKE 1 TABLET TWICE A DAY Patient taking differently: Take 50 mg by mouth 2 (two) times daily.  11/19/18   Jearld Fenton, NP  montelukast (SINGULAIR) 10 MG tablet TAKE 1 TABLET AT BEDTIME Patient taking differently: Take 10 mg by mouth at bedtime.  02/24/18   Jearld Fenton, NP  nitroGLYCERIN (NITROSTAT) 0.4 MG SL tablet Place 0.4 mg under the tongue every 5 (five) minutes as needed for chest pain.     [provider]  silver sulfADIAZINE (SILVADENE) 1 % cream Apply 1 application topically daily.    [provider]  tamsulosin (FLOMAX) 0.4 MG CAPS capsule TAKE 1 CAPSULE DAILY Patient taking differently: Take 0.4 mg by mouth daily after supper.  12/25/18   Jearld Fenton, NP  traZODone (DESYREL) 50 MG tablet Take 2 tablets (100 mg total) by mouth at bedtime. 11/20/18   Jearld Fenton, NP  vitamin C (ASCORBIC ACID) 250 MG tablet Take 250 mg by mouth 2 (two) times daily.    [provider]     Allergies:     Allergies  Allergen Reactions  . Contrast Media [Iodinated  Diagnostic Agents] Shortness Of Breath  . Morphine And Related Other (See Comments)    Hallucinations   . Niacin And Related Dermatitis  . Other     Other reaction(s): SHORTNESS OF BREATH  . Amlodipine Rash     Physical Exam:   Vitals  There were no vitals taken for this visit.   1. General frail, elderly white male, in bed, comfortable.  2. Normal affect and insight, Not Suicidal or Homicidal, Awake Alert, Oriented X 3.  3. No F.N deficits, ALL C.Nerves Intact, Strength 5/5 all 4 extremities, Sensation intact all 4 extremities, Plantars down going.  4. Ears and Eyes appear Normal, Conjunctivae clear, PERRLA. Moist Oral Mucosa.  5. Supple Neck, No JVD, No cervical lymphadenopathy appriciated, No Carotid Bruits.  6. Symmetrical Chest wall movement, Good air movement bilaterally, CTAB.  7. RRR, No Gallops, Rubs or Murmurs, No Parasternal Heave.  8. Positive Bowel Sounds, Abdomen Soft, No tenderness, No organomegaly appriciated,No rebound -guarding or  rigidity.  9.  No Cyanosis, Normal Skin Turgor, No Skin Rash or Bruise.  10. Good muscle tone,  joints appear normal , no effusions, Normal ROM. L-BKA  11. No Palpable Lymph Nodes in Neck or Axillae   L. BKA stump      Right heel.       Data Review:    CBC Recent Labs  Lab 01/25/19 1408 01/26/19 0358 01/15/2019 0529  WBC 9.1 4.6 9.8  HGB 13.6 13.1 13.2  HCT 41.6 40.9 41.9  PLT 189 165 173  MCV 87.2 87.6 88.4  MCH 28.5 28.1 27.8  MCHC 32.7 32.0 31.5  RDW 16.1* 16.1* 15.9*  LYMPHSABS 2.1  --   --   MONOABS 0.8  --   --   EOSABS 0.0  --   --   BASOSABS 0.0  --   --    ------------------------------------------------------------------------------------------------------------------  Chemistries  Recent Labs  Lab 01/25/19 1408 01/26/19 0358 01/24/2019 0529  NA 139 141 144  K 3.4* 3.2* 3.7  CL 94* 101 106  CO2 31 27 28   GLUCOSE 166* 317* 90  BUN 44* 48* 54*  CREATININE 1.80* 1.80* 1.50*  CALCIUM  8.5* 8.4* 8.9  MG  --  2.1 2.2  AST 26  --   --   ALT 17  --   --   ALKPHOS 63  --   --   BILITOT 0.6  --   --    ------------------------------------------------------------------------------------------------------------------ estimated creatinine clearance is 36 mL/min (A) (by C-G formula based on SCr of 1.5 mg/dL (H)). ------------------------------------------------------------------------------------------------------------------ Recent Labs    01/26/19 0358  TSH 1.392    Coagulation profile No results for input(s): INR, PROTIME in the last 168 hours. ------------------------------------------------------------------------------------------------------------------- No results for input(s): DDIMER in the last 72 hours. -------------------------------------------------------------------------------------------------------------------  Cardiac Enzymes No results for input(s): CKMB, TROPONINI, MYOGLOBIN in the last 168 hours.  Invalid input(s): CK ------------------------------------------------------------------------------------------------------------------    Component Value Date/Time   BNP 288.0 (H) 01/25/2019 1408     ---------------------------------------------------------------------------------------------------------------  Urinalysis    Component Value Date/Time   COLORURINE STRAW (A) 01/25/2019 1616   APPEARANCEUR CLEAR (A) 01/25/2019 1616   APPEARANCEUR Cloudy (A) 09/20/2014 1440   LABSPEC 1.008 01/25/2019 1616   LABSPEC 1.010 08/05/2014 2030   PHURINE 5.0 01/25/2019 1616   GLUCOSEU NEGATIVE 01/25/2019 1616   GLUCOSEU 50 mg/dL 08/05/2014 2030   GLUCOSEU NEGATIVE 02/14/2014 1257   HGBUR SMALL (A) 01/25/2019 1616   BILIRUBINUR NEGATIVE 01/25/2019 1616   BILIRUBINUR neg 01/22/2019 1140   BILIRUBINUR Negative 09/20/2014 1440   BILIRUBINUR Negative 08/05/2014 2030   KETONESUR NEGATIVE 01/25/2019 1616   PROTEINUR NEGATIVE 01/25/2019 1616   UROBILINOGEN  0.2 01/22/2019 1140   UROBILINOGEN 0.2 02/14/2014 1257   NITRITE NEGATIVE 01/25/2019 1616   LEUKOCYTESUR NEGATIVE 01/25/2019 1616   LEUKOCYTESUR 3+ 08/05/2014 2030    ----------------------------------------------------------------------------------------------------------------   Imaging Results:    Dg Chest Port 1 View  Result Date: 01/25/2019 CLINICAL DATA:  Fever. Shortness of breath. EXAM: PORTABLE CHEST 1 VIEW COMPARISON:  10/13/2018 FINDINGS: There is cardiomegaly with pulmonary vascular congestion. There is haziness in the left perihilar region and in the right upper and lower lobes, probably representing mild pulmonary edema or possibly faint pneumonia. No discrete pleural effusions. No acute bone abnormality. Prominent degenerative changes of the right shoulder. IMPRESSION: 1. Hazy infiltrates in the left perihilar region and in the right upper and lower lobes. 2. Cardiomegaly with pulmonary vascular congestion. The hazy infiltrates could represent pulmonary edema or  pneumonia. Electronically Signed   By: Lorriane Shire M.D.   On: 01/25/2019 14:46   Dg Knee Complete 4 Views Left  Result Date: 01/25/2019 CLINICAL DATA:  Fever. Soft tissue wounds on the left BKA stump. EXAM: LEFT KNEE - COMPLETE 4+ VIEW COMPARISON:  Radiographs dated 12/25/2017 FINDINGS: Surgical margins of the fibula and tibia are sharp with no radiographic evidence suggestive of osteomyelitis. No significant arthritic changes of the knee. Vascular stent in the distal left thigh. IMPRESSION: No significant abnormality. Specifically, no evidence of osteomyelitis in the tibia or fibula. Electronically Signed   By: Lorriane Shire M.D.   On: 01/25/2019 14:48    My personal review of EKG: Rhythm Afib, Rate  100 /min, QTc 482ms , no Acute ST changes   Assessment & Plan:     1.  Acute on chronic hypoxic respiratory failure due to acute COVID-19 pneumonitis.  Patient is on as needed 2 L nasal cannula oxygen at home due to  chronic diastolic CHF and COPD.  His COVID-19 infection is moderate in intensity, will be placed on IV steroids along with IV Remdisvir.  Monitor inflammatory markers closely.  Continue full supportive care.  COVID-19 Labs  Recent Labs    01/26/19 0358 01/26/19 0713  FERRITIN  --  260  CRP 7.1*  --     Lab Results  Component Value Date   SARSCOV2NAA POSITIVE (A) 01/25/2019   [place] NEGATIVE 10/12/2018   [place] NEGATIVE 10/03/2018   [place] NEGATIVE 09/10/2018    2.  Chronic diastolic CHF.  EF 60% on echocardiogram in 2016.  Currently compensated.  Continue home dose beta-blocker.  3.  CAD.  No acute issues home dose beta-blocker, statin & Zetia will be continued for secondary prevention.  4.  Chronic atrial fibrillation.  Mali vas 2 score of at least 3.  Continue combination of oral beta-blocker and Eliquis.  Pharmacy to dose.  PRN IV Lopressor also ordered.  5.  Hypertension.  Stable on beta-blocker.  6.  Dyslipidemia.  Stable on combination of statin and Zetia.  7.  Recent left BKA. Came with Left BKA and right heel ulcers which he came with at [facility].  Wound care will be consulted.  No signs of active infection.  See pictures aboveSupportive care.  PT OT.  8.  COPD.  On as needed home oxygen.  No acute issues supportive care.  9.  Diabetic peripheral neuropathy.  Continue Celexa and Neurontin.    10.  AKI.  Baseline creatinine around 1.2.  Avoid nephrotoxins and monitor.  Renal function seems to be improving.  This happened at [facility].    11.  OSA.  Oxygen at night at [facility].    12. DM type II.  On Lantus and sliding scale.  Pre-meal NovoLog added.  Recent A1c suggests stable outpatient control.  Monitor and adjust    DVT Prophylaxis Eliquis  AM Labs Ordered, also please review Full Orders  Family Communication: Admission, patients condition and plan of care including tests being ordered have been discussed with the patient who indicates  understanding and agree with the plan and Code Status.  Code Status Full  Likely DC to  Home  Condition GUARDED    Consults called: None    Admission status: Inpt    Time spent in minutes : 35   Lala Lund M.D on 02/10/2019 at 11:56 AM  To page go to [email or web address] - password [facility]

## 2019-01-27 NOTE — Progress Notes (Signed)
Patient removed from BiPAP and placed on 2l [place].

## 2019-01-27 NOTE — Progress Notes (Signed)
Pt's daughter, Butch Penny, called and updated on patient's arrival to [facility].  Plan of care reviewed.  All questions welcomed and answered.

## 2019-01-27 NOTE — Discharge Summary (Signed)
CRITICAL CARE PROGRESS NOTE    Name: [name]. MRN: [id number] DOB: [birth year]     LOS: 2   SUBJECTIVE FINDINGS & SIGNIFICANT EVENTS   Patient description:   [name]  is a 85 y.o. male with a known history of CAD, chronic atrial fibrillation on anticoagulation with Eliquis, chronic diastolic CHF, COPD, CVA, diabetes mellitus and peripheral artery disease status post left BKA who presented to the emergency room with complaints of fevers.  Patient noted a fever of 101.9 about 4 days ago at home.  Admitted to some shortness of breath and cough occasionally productive of sputum over the last several days.  Patient also noted to have ulceration on the right heel and the left BKA stump.  Chest x-ray done with findings consistent with multifocal pneumonia.  Patient was diagnosed with probable sepsis as evidenced by presence of tachycardia and tachypnea MD setting of ongoing infection.  Given broad-spectrum IV antibiotics with vancomycin and Zosyn.  Medical service called to admit patient for further evaluation.   Lines / Drains: PIC  Cultures / Sepsis markers: COVID+  Antibiotics: Remdesevir S/p vanc/zosyn  Protocols / Consultants: Plan for [facility] transfer today   Tests / Events: CXR  Overnight: No events overnight, patient improved   PAST MEDICAL HISTORY   Past Medical History:  Diagnosis Date  . Arthritis   . CAD    a. MI 01/29/1996 tx'd w/ TPA @ [place]; b. Myoview 06/2005: EF 50%, scar @ apex, mild peri-infarct ischemia  . Cancer ([place])    skin  . Chronic atrial fibrillation ([place])    a. since 2006; b. on warfarin  . Chronic diastolic CHF (congestive heart failure) ([place])    a. echo 04/2006: EF lower limits of nl, mod LVH, mild aortic root dilatation, & mild MR, biatrial enlargement; b. echo  04/2013: EF 60%, mod dilated LA, mild MR & TR, mod pulm HTN w/ RV systolic pressure 53, c. echo 04/21/14: EF 55-60%, unable to exclude WMA, severely dilated LA 6.6 cm, nl RVSP, mildly dilated aortic root  . COPD (chronic obstructive pulmonary disease) ([facility])    oxygen prn at home  . CVA 1062,6948   x2  . DM   . Falls   . GERD (gastroesophageal reflux disease)   . History of kidney stones   . HYPERLIPIDEMIA   . HYPERTENSION   . Kidney stone    a. s/p left ureteral stenting 04/24/14  . Left arm weakness    limited movement. S/P fall injury  . Neuropathy of both feet   . Poor balance   . Wears dentures    full upper and lower     SURGICAL HISTORY   Past Surgical History:  Procedure Laterality Date  . AMPUTATION Left 10/06/2018   Procedure: AMPUTATION BELOW KNEE;  Surgeon: Algernon Huxley, MD;  Location: [facility];  Service: General;  Laterality: Left;  . AMPUTATION TOE Left 06/13/2018   Procedure: 1st Ray Resection Left;  Surgeon: Sharlotte Alamo, DPM;  Location: [facility];  Service: Podiatry;  Laterality: Left;  . BLADDER SURGERY     stent placement   . CARDIAC CATHETERIZATION  1997   [facility]  . CAROTID STENT INSERTION  1997  . CATARACT EXTRACTION W/PHACO Left 10/11/2017   Procedure: CATARACT EXTRACTION PHACO AND INTRAOCULAR LENS PLACEMENT ([place]) COMPLICATED LEFT DIABETIC;  Surgeon: Leandrew Koyanagi, MD;  Location: [facility];  Service: Ophthalmology;  Laterality: Left;  MALYUGIN Diabetic - insulin  . CATARACT EXTRACTION W/PHACO Right  11/30/2017   Procedure: CATARACT EXTRACTION PHACO AND INTRAOCULAR LENS PLACEMENT ([place]) COMPLICATED  RIGHT DIABETIC;  Surgeon: Leandrew Koyanagi, MD;  Location: [facility];  Service: Ophthalmology;  Laterality: Right;  Diabetic - insulin  . CIRCUMCISION  2016  . CORONARY ANGIOPLASTY  1997   s/p stent placement x 2   . CYSTOSCOPY W/ URETERAL STENT REMOVAL Left 10/09/2014   Procedure: CYSTOSCOPY WITH STENT REMOVAL;  Surgeon: Hollice Espy, MD;   Location: [facility];  Service: Urology;  Laterality: Left;  . CYSTOSCOPY WITH STENT PLACEMENT Left 10/09/2014   Procedure: CYSTOSCOPY WITH STENT PLACEMENT;  Surgeon: Hollice Espy, MD;  Location: [facility];  Service: Urology;  Laterality: Left;  . IRRIGATION AND DEBRIDEMENT FOOT Left 09/15/2018   Procedure: IRRIGATION AND DEBRIDEMENT FOOT DELAY CLOSURE;  Surgeon: Samara Deist, DPM;  Location: [facility];  Service: Podiatry;  Laterality: Left;  . KIDNEY SURGERY  05/2013   s/p stent placement   . LOWER EXTREMITY ANGIOGRAPHY Left 06/12/2018   Procedure: Lower Extremity Angiography;  Surgeon: Algernon Huxley, MD;  Location: [facility];  Service: Cardiovascular;  Laterality: Left;  . LOWER EXTREMITY ANGIOGRAPHY Left 09/13/2018   Procedure: Lower Extremity Angiography;  Surgeon: Algernon Huxley, MD;  Location: [facility];  Service: Cardiovascular;  Laterality: Left;  . stents ureters Bilateral   . TONSILLECTOMY AND ADENOIDECTOMY  1959  . URETEROSCOPY WITH HOLMIUM LASER LITHOTRIPSY Left 10/09/2014   Procedure: URETEROSCOPY WITH HOLMIUM LASER LITHOTRIPSY;  Surgeon: Hollice Espy, MD;  Location: [facility];  Service: Urology;  Laterality: Left;     FAMILY HISTORY   Family History  Problem Relation Age of Onset  . Heart disease Mother   . Diabetes Mother   . Heart disease Maternal Grandmother   . Diabetes Maternal Grandmother   . Cancer Neg Hx   . Stroke Neg Hx      SOCIAL HISTORY   Social History   Tobacco Use  . Smoking status: Former Smoker    Packs/day: 2.00    Years: 40.00    Pack years: 80.00    Types: Cigarettes    Quit date: 05/24/1990    Years since quitting: 28.6  . Smokeless tobacco: Former Systems developer    Quit date: 05/24/1990  Substance Use Topics  . Alcohol use: No  . Drug use: No     MEDICATIONS   Current Medication: No current facility-administered medications for this encounter.  No current outpatient medications on file.  Facility-Administered Medications  Ordered in Other Encounters:  .  acetaminophen (TYLENOL) tablet 650 mg, 650 mg, Oral, Q6H PRN, Thurnell Lose, MD .  ALPRAZolam Duanne Moron) tablet 0.25 mg, 0.25 mg, Oral, QHS PRN, Thurnell Lose, MD .  apixaban (ELIQUIS) tablet 2.5 mg, 2.5 mg, Oral, BID, Candiss Norse, Prashant K, MD .  atorvastatin (LIPITOR) tablet 40 mg, 40 mg, Oral, q1800, Thurnell Lose, MD, 40 mg at 01/15/2019 1700 .  chlorhexidine (PERIDEX) 0.12 % solution 15 mL, 15 mL, Mouth Rinse, BID, Thurnell Lose, MD, 15 mL at 02/08/2019 1319 .  citalopram (CELEXA) tablet 10 mg, 10 mg, Oral, Daily, Thurnell Lose, MD, 10 mg at 01/26/2019 1319 .  collagenase (SANTYL) ointment, , Topical, Daily, Lala Lund K, MD .  docusate sodium (COLACE) capsule 100 mg, 100 mg, Oral, BID, Singh, Prashant K, MD .  dorzolamide (TRUSOPT) 2 % ophthalmic solution 1 drop, 1 drop, Both Eyes, TID, Thurnell Lose, MD, 1 drop at 01/15/2019 1659 .  ezetimibe (ZETIA) tablet 10  mg, 10 mg, Oral, Daily, Thurnell Lose, MD, 10 mg at 01/26/2019 1319 .  finasteride (PROSCAR) tablet 5 mg, 5 mg, Oral, Daily, Thurnell Lose, MD, 5 mg at 01/31/2019 1320 .  gabapentin (NEURONTIN) capsule 100 mg, 100 mg, Oral, TID, Thurnell Lose, MD, 100 mg at 01/14/2019 1700 .  insulin aspart (novoLOG) injection 0-15 Units, 0-15 Units, Subcutaneous, TID WC, Thurnell Lose, MD, 8 Units at 02/08/2019 1659 .  insulin aspart (novoLOG) injection 0-5 Units, 0-5 Units, Subcutaneous, QHS, Singh, Prashant K, MD .  insulin aspart (novoLOG) injection 3 Units, 3 Units, Subcutaneous, TID WC, Thurnell Lose, MD, 3 Units at 01/11/2019 1659 .  insulin glargine (LANTUS) injection 30 Units, 30 Units, Subcutaneous, BID, Thurnell Lose, MD, 30 Units at 01/30/2019 1317 .  latanoprost (XALATAN) 0.005 % ophthalmic solution 1 drop, 1 drop, Both Eyes, QHS, Thurnell Lose, MD .  Derrill Memo ON 01/28/2019] levothyroxine (SYNTHROID) tablet 50 mcg, 50 mcg, Oral, QAC breakfast, Lala Lund K, MD .  magnesium  citrate solution 1 Bottle, 1 Bottle, Oral, Once PRN, Thurnell Lose, MD .  MEDLINE mouth rinse, 15 mL, Mouth Rinse, q12n4p, Thurnell Lose, MD, 15 mL at 01/16/2019 1659 .  methylPREDNISolone sodium succinate (SOLU-MEDROL) 125 mg/2 mL injection 60 mg, 60 mg, Intravenous, Q12H, Thurnell Lose, MD, 60 mg at 01/15/2019 1313 .  metoprolol succinate (TOPROL-XL) 24 hr tablet 50 mg, 50 mg, Oral, BID, Candiss Norse, Prashant K, MD .  metoprolol tartrate (LOPRESSOR) injection 5 mg, 5 mg, Intravenous, Q6H PRN, Thurnell Lose, MD .  nitroGLYCERIN (NITROSTAT) SL tablet 0.4 mg, 0.4 mg, Sublingual, Q5 min PRN, Candiss Norse, Margaree Mackintosh, MD .  [DISCONTINUED] ondansetron (ZOFRAN) tablet 4 mg, 4 mg, Oral, Q6H PRN **OR** ondansetron (ZOFRAN) injection 4 mg, 4 mg, Intravenous, Q6H PRN, Thurnell Lose, MD .  Margrett Rud remdesivir 100 mg in sodium chloride 0.9 % 250 mL IVPB, 100 mg, Intravenous, Q24H, Stopped at 01/26/2019 1400 **FOLLOWED BY** [START ON 01/28/2019] remdesivir 100 mg in sodium chloride 0.9 % 250 mL IVPB, 100 mg, Intravenous, Q24H, Singh, Prashant K, MD .  sodium chloride flush (NS) 0.9 % injection 3 mL, 3 mL, Intravenous, Q12H, Candiss Norse, Prashant K, MD, 3 mL at 01/28/2019 1230 .  tamsulosin (FLOMAX) capsule 0.4 mg, 0.4 mg, Oral, QPC supper, Lala Lund K, MD, 0.4 mg at 01/16/2019 1700 .  traZODone (DESYREL) tablet 100 mg, 100 mg, Oral, QHS, Candiss Norse, Margaree Mackintosh, MD .  vitamin C (ASCORBIC ACID) tablet 250 mg, 250 mg, Oral, BID, Candiss Norse, Margaree Mackintosh, MD    ALLERGIES   Contrast media [iodinated diagnostic agents], Morphine and related, Niacin and related, Other, and Amlodipine    REVIEW OF SYSTEMS    ROS unable to obtain due to COVID precautions to preserve pPE  PHYSICAL EXAMINATION   Vital Signs: Temp:  [97.6 F (36.4 C)-98.6 F (37 C)] 97.6 F (36.4 C) (10/17 1600) Pulse Rate:  [59-97] 97 (10/17 1600) Resp:  [17-24] 22 (10/17 1600) BP: (109-166)/(69-89) 121/77 (10/17 1600) SpO2:  [92 %-99 %] 92 %  (10/17 1600) Weight:  [80 kg] 80 kg (10/17 1413)  GENERAL:NAD HEAD: Normocephalic, atraumatic.  EYES: Pupils equal, round.  No scleral icterus.  MOUTH: Moist mucosal membrane. NECK: Supple. No thyromegaly. No nodules. No JVD.  PULMONARY non tachypneic CARDIOVASCULAR: telemetry reviewed GASTROINTESTINAL: non distended.  MUSCULOSKELETAL:skin intact.  NEUROLOGIC: Mild distress due to acute illness SKIN:intact,warm,dry  COVID-19 DISASTER DECLARATION:   FULL CONTACT PHYSICAL EXAMINATION WAS NOT POSSIBLE DUE TO  TREATMENT OF COVID-19  AND CONSERVATION OF PERSONAL PROTECTIVE EQUIPMENT, LIMITED EXAM FINDINGS INCLUDE-    Patient assessed or the symptoms described in the history of present illness.  In the context of the Global COVID-19 pandemic, which necessitated consideration that the patient might be at risk for infection with the SARS-CoV-2 virus that causes COVID-19, Institutional protocols and algorithms that pertain to the evaluation of patients at risk for COVID-19 are in a state of rapid change based on information released by regulatory bodies including the CDC and federal and state organizations. These policies and algorithms were followed during the patient's care while in hospital.   PERTINENT DATA     Infusions:  Scheduled Medications:  PRN Medications:  Hemodynamic parameters:   Intake/Output: 10/16 0701 - 10/17 0700 In: 300 [P.O.:200; IV Piggyback:100] Out: 1937 [Urine:1815]  Ventilator  Settings:      LAB RESULTS:  Basic Metabolic Panel: Recent Labs  Lab 01/25/19 1408 01/26/19 0358 02/06/2019 0529  NA 139 141 144  K 3.4* 3.2* 3.7  CL 94* 101 106  CO2 31 27 28   GLUCOSE 166* 317* 90  BUN 44* 48* 54*  CREATININE 1.80* 1.80* 1.50*  CALCIUM 8.5* 8.4* 8.9  MG  --  2.1 2.2  PHOS  --   --  3.4   Liver Function Tests: Recent Labs  Lab 01/25/19 1408  AST 26  ALT 17  ALKPHOS 63  BILITOT 0.6  PROT 6.6  ALBUMIN 3.0*   No results for input(s):  LIPASE, AMYLASE in the last 168 hours. No results for input(s): AMMONIA in the last 168 hours. CBC: Recent Labs  Lab 01/25/19 1408 01/26/19 0358 02/04/2019 0529  WBC 9.1 4.6 9.8  NEUTROABS 6.1  --   --   HGB 13.6 13.1 13.2  HCT 41.6 40.9 41.9  MCV 87.2 87.6 88.4  PLT 189 165 173   Cardiac Enzymes: No results for input(s): CKTOTAL, CKMB, CKMBINDEX, TROPONINI in the last 168 hours. BNP: Invalid input(s): POCBNP CBG: Recent Labs  Lab 01/26/19 1236 01/26/19 1542 01/26/19 2109 02/03/2019 0805 01/14/2019 1606  GLUCAP 295* 238* 128* 89 299*     IMAGING RESULTS:  Imaging: No results found.    ASSESSMENT AND PLAN    -Multidisciplinary rounds held today  Acute Hypoxic Respiratory Failure -due to COVID19 infection -Currently on RA - was noted to be desaturating to mid 80s intermittently -bronchopulmonary hygiene with IS at bedside and chest physiotherapy via bed -continue Bronchodilator Therapy -possible intercurrent acute exacerbation of COPD - continue dulera, absence of voluminous phlegm production/discolored phlegm    COVID 19 associated pneumonia -CXR as above with bilateral infiltrates -Remdesevir - pharmacy on case - appreciate collaboration  - Actemra - will consider if appropriate per hospital policy- currently CRP is <10 -Vitamin C-250mg  bid -Dexamethasone - 6mg  iv daily      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 -10/16- creatinine improved d/c nonessential nephrotoxins -follow chem 7 -follow UO -continue Foley Catheter-assess need daily    OSA   - may use home CPAP device QHS    ID -continue IV abx as prescibed -follow up cultures   GI/Nutrition GI PROPHYLAXIS as indicated DIET-->TF's as tolerated Constipation protocol as indicated   ENDO - ICU hypoglycemic\Hyperglycemia protocol -check FSBS per protocol   ELECTROLYTES -follow labs as needed -replace as needed -pharmacy consultation   DVT/GI PRX  ordered -SCDs -eliquis 2.5 bid  TRANSFUSIONS AS NEEDED MONITOR FSBS ASSESS the need for LABS as needed   Critical care  provider statement:    Critical care time (minutes):  32   Critical care time was exclusive of:  Separately billable procedures and treating other patients   Critical care was necessary to treat or prevent imminent or life-threatening deterioration of the following conditions:  acute hypoxic respiratory failure, COVID 19 pneumonia, multiple comorbid conditions   Critical care was time spent personally by me on the following activities:  Development of treatment plan with patient or surrogate, discussions with consultants, evaluation of patient's response to treatment, examination of patient, obtaining history from patient or surrogate, ordering and performing treatments and interventions, ordering and review of laboratory studies and re-evaluation of patient's condition.  I assumed direction of critical care for this patient from another provider in my specialty: no    This document was prepared using Dragon voice recognition software and may include unintentional dictation errors.    Ottie Glazier, M.D.  Division of [place]

## 2019-01-27 NOTE — Progress Notes (Signed)
CRITICAL CARE PROGRESS NOTE    Name: [name]. MRN: [id number] DOB: [birth year]     LOS: 2   SUBJECTIVE FINDINGS & SIGNIFICANT EVENTS   Patient description:   [name]  is a 85 y.o. male with a known history of CAD, chronic atrial fibrillation on anticoagulation with Eliquis, chronic diastolic CHF, COPD, CVA, diabetes mellitus and peripheral artery disease status post left BKA who presented to the emergency room with complaints of fevers.  Patient noted a fever of 101.9 about 4 days ago at home.  Admitted to some shortness of breath and cough occasionally productive of sputum over the last several days.  Patient also noted to have ulceration on the right heel and the left BKA stump.  Chest x-ray done with findings consistent with multifocal pneumonia.  Patient was diagnosed with probable sepsis as evidenced by presence of tachycardia and tachypnea MD setting of ongoing infection.  Given broad-spectrum IV antibiotics with vancomycin and Zosyn.  Medical service called to admit patient for further evaluation.   Lines / Drains: PIC  Cultures / Sepsis markers: COVID+  Antibiotics: Remdesevir S/p vanc/zosyn  Protocols / Consultants: Plan for [facility] transfer today   Tests / Events: CXR  Overnight: No events overnight, patient improved   PAST MEDICAL HISTORY   Past Medical History:  Diagnosis Date  . Arthritis   . CAD    a. MI 01/29/1996 tx'd w/ TPA @ [place]; b. Myoview 06/2005: EF 50%, scar @ apex, mild peri-infarct ischemia  . Cancer ([place])    skin  . Chronic atrial fibrillation ([place])    a. since 2006; b. on warfarin  . Chronic diastolic CHF (congestive heart failure) ([place])    a. echo 04/2006: EF lower limits of nl, mod LVH, mild aortic root dilatation, & mild MR, biatrial enlargement; b. echo  04/2013: EF 60%, mod dilated LA, mild MR & TR, mod pulm HTN w/ RV systolic pressure 53, c. echo 04/21/14: EF 55-60%, unable to exclude WMA, severely dilated LA 6.6 cm, nl RVSP, mildly dilated aortic root  . COPD (chronic obstructive pulmonary disease) (HCC)    oxygen prn at home  . CVA [id number]   x2  . DM   . Falls   . GERD (gastroesophageal reflux disease)   . History of kidney stones   . HYPERLIPIDEMIA   . HYPERTENSION   . Kidney stone    a. s/p left ureteral stenting 04/24/14  . Left arm weakness    limited movement. S/P fall injury  . Neuropathy of both feet   . Poor balance   . Wears dentures    full upper and lower     SURGICAL HISTORY   Past Surgical History:  Procedure Laterality Date  . AMPUTATION Left 10/06/2018   Procedure: AMPUTATION BELOW KNEE;  Surgeon: Algernon Huxley, MD;  Location: [facility];  Service: General;  Laterality: Left;  . AMPUTATION TOE Left 06/13/2018   Procedure: 1st Ray Resection Left;  Surgeon: Sharlotte Alamo, DPM;  Location: [facility];  Service: Podiatry;  Laterality: Left;  . BLADDER SURGERY     stent placement   . CARDIAC CATHETERIZATION  1997   [facility]  . CAROTID STENT INSERTION  1997  . CATARACT EXTRACTION W/PHACO Left 10/11/2017   Procedure: CATARACT EXTRACTION PHACO AND INTRAOCULAR LENS PLACEMENT ([place]) COMPLICATED LEFT DIABETIC;  Surgeon: Leandrew Koyanagi, MD;  Location: [facility];  Service: Ophthalmology;  Laterality: Left;  MALYUGIN Diabetic - insulin  . CATARACT EXTRACTION W/PHACO Right  11/30/2017   Procedure: CATARACT EXTRACTION PHACO AND INTRAOCULAR LENS PLACEMENT ([place]) COMPLICATED  RIGHT DIABETIC;  Surgeon: Leandrew Koyanagi, MD;  Location: [facility];  Service: Ophthalmology;  Laterality: Right;  Diabetic - insulin  . CIRCUMCISION  2016  . CORONARY ANGIOPLASTY  1997   s/p stent placement x 2   . CYSTOSCOPY W/ URETERAL STENT REMOVAL Left 10/09/2014   Procedure: CYSTOSCOPY WITH STENT REMOVAL;  Surgeon: Hollice Espy, MD;   Location: [facility];  Service: Urology;  Laterality: Left;  . CYSTOSCOPY WITH STENT PLACEMENT Left 10/09/2014   Procedure: CYSTOSCOPY WITH STENT PLACEMENT;  Surgeon: Hollice Espy, MD;  Location: [facility];  Service: Urology;  Laterality: Left;  . IRRIGATION AND DEBRIDEMENT FOOT Left 09/15/2018   Procedure: IRRIGATION AND DEBRIDEMENT FOOT DELAY CLOSURE;  Surgeon: Samara Deist, DPM;  Location: [facility];  Service: Podiatry;  Laterality: Left;  . KIDNEY SURGERY  05/2013   s/p stent placement   . LOWER EXTREMITY ANGIOGRAPHY Left 06/12/2018   Procedure: Lower Extremity Angiography;  Surgeon: Algernon Huxley, MD;  Location: [facility];  Service: Cardiovascular;  Laterality: Left;  . LOWER EXTREMITY ANGIOGRAPHY Left 09/13/2018   Procedure: Lower Extremity Angiography;  Surgeon: Algernon Huxley, MD;  Location: [facility];  Service: Cardiovascular;  Laterality: Left;  . stents ureters Bilateral   . TONSILLECTOMY AND ADENOIDECTOMY  1959  . URETEROSCOPY WITH HOLMIUM LASER LITHOTRIPSY Left 10/09/2014   Procedure: URETEROSCOPY WITH HOLMIUM LASER LITHOTRIPSY;  Surgeon: Hollice Espy, MD;  Location: [facility];  Service: Urology;  Laterality: Left;     FAMILY HISTORY   Family History  Problem Relation Age of Onset  . Heart disease Mother   . Diabetes Mother   . Heart disease Maternal Grandmother   . Diabetes Maternal Grandmother   . Cancer Neg Hx   . Stroke Neg Hx      SOCIAL HISTORY   Social History   Tobacco Use  . Smoking status: Former Smoker    Packs/day: 2.00    Years: 40.00    Pack years: 80.00    Types: Cigarettes    Quit date: 05/24/1990    Years since quitting: 28.6  . Smokeless tobacco: Former Systems developer    Quit date: 05/24/1990  Substance Use Topics  . Alcohol use: No  . Drug use: No     MEDICATIONS   Current Medication:  Current Facility-Administered Medications:  .  acetaminophen (TYLENOL) tablet 650 mg, 650 mg, Oral, Q6H PRN, Ojie, Jude, MD .  albuterol  (PROVENTIL) (2.5 MG/3ML) 0.083% nebulizer solution 2.5 mg, 2.5 mg, Inhalation, Q4H PRN, Ojie, Jude, MD .  ALPRAZolam Duanne Moron) tablet 0.25 mg, 0.25 mg, Oral, QHS PRN, Ojie, Jude, MD .  apixaban (ELIQUIS) tablet 2.5 mg, 2.5 mg, Oral, BID, Ojie, Jude, MD, 2.5 mg at 01/26/19 2113 .  atorvastatin (LIPITOR) tablet 40 mg, 40 mg, Oral, Daily, Ojie, Jude, MD, 40 mg at 01/26/19 0909 .  brimonidine (ALPHAGAN) 0.2 % ophthalmic solution 1 drop, 1 drop, Both Eyes, Q12H, 1 drop at 01/26/19 2117 **AND** timolol (TIMOPTIC) 0.5 % ophthalmic solution 1 drop, 1 drop, Both Eyes, BID, Charlett Nose, RPH, 1 drop at 01/26/19 2117 .  ceFEPIme (MAXIPIME) 2 g in sodium chloride 0.9 % 100 mL IVPB, 2 g, Intravenous, Q12H, Benita Gutter, RPH, Last Rate: 200 mL/hr at 01/26/19 2137, 2 g at 01/26/19 2137 .  Chlorhexidine Gluconate Cloth 2 % PADS 6 each, 6 each, Topical, Daily, Bradly Bienenstock, NP, 6 each at  01/26/19 7893 .  cholecalciferol (VITAMIN D3) tablet 400 Units, 400 Units, Oral, Daily, Awilda Bill, NP, 400 Units at 01/26/19 1551 .  citalopram (CELEXA) tablet 10 mg, 10 mg, Oral, Daily, Ojie, Jude, MD, 10 mg at 01/26/19 0907 .  colchicine tablet 0.6 mg, 0.6 mg, Oral, BID PRN, [place] Jock, Jude, MD .  collagenase (SANTYL) ointment, , Topical, Daily, Wieting, Richard, MD .  dexamethasone (DECADRON) injection 6 mg, 6 mg, Intravenous, Q24H, Darel Hong D, NP, 6 mg at 01/19/2019 0511 .  diclofenac sodium (VOLTAREN) 1 % transdermal gel 4 g, 4 g, Topical, QID PRN, Ojie, Jude, MD .  docusate sodium (COLACE) capsule 100 mg, 100 mg, Oral, BID, Ojie, Jude, MD, 100 mg at 01/26/19 0908 .  dorzolamide (TRUSOPT) 2 % ophthalmic solution 1 drop, 1 drop, Both Eyes, TID, Ojie, Jude, MD, 1 drop at 01/26/19 2114 .  ezetimibe (ZETIA) tablet 10 mg, 10 mg, Oral, Daily, Ojie, Jude, MD, 10 mg at 01/26/19 1100 .  famotidine (PEPCID) 40 MG/5ML suspension 20 mg, 20 mg, Oral, Daily, Blakeney, Dana G, NP, 20 mg at 01/26/19 1551 .  feeding  supplement (PRO-STAT SUGAR FREE 64) liquid 30 mL, 30 mL, Oral, TID WC, Ojie, Jude, MD, 30 mL at 01/26/19 1550 .  finasteride (PROSCAR) tablet 5 mg, 5 mg, Oral, Daily, Ojie, Jude, MD, 5 mg at 01/26/19 0905 .  gabapentin (NEURONTIN) capsule 100 mg, 100 mg, Oral, TID, Ojie, Jude, MD, 100 mg at 01/26/19 2114 .  influenza vaccine adjuvanted (FLUAD) injection 0.5 mL, 0.5 mL, Intramuscular, Tomorrow-1000, Ojie, Jude, MD .  insulin aspart (novoLOG) injection 0-5 Units, 0-5 Units, Subcutaneous, QHS, Ojie, Jude, MD, 3 Units at 01/25/19 2224 .  insulin aspart (novoLOG) injection 0-9 Units, 0-9 Units, Subcutaneous, TID WC, Ojie, Jude, MD, 3 Units at 01/26/19 1552 .  insulin aspart (novoLOG) injection 8 Units, 8 Units, Subcutaneous, TID WC, Awilda Bill, NP, 8 Units at 01/26/19 1552 .  insulin detemir (LEVEMIR) injection 35 Units, 35 Units, Subcutaneous, Q2200, Awilda Bill, NP, 35 Units at 01/26/19 2115 .  latanoprost (XALATAN) 0.005 % ophthalmic solution 1 drop, 1 drop, Both Eyes, QHS, Ojie, Jude, MD, 1 drop at 01/26/19 2117 .  levothyroxine (SYNTHROID) tablet 50 mcg, 50 mcg, Oral, Q0600, Ojie, Jude, MD, 50 mcg at 02/05/2019 0510 .  magnesium citrate solution 1 Bottle, 1 Bottle, Oral, Daily PRN, Ojie, Jude, MD .  metoprolol succinate (TOPROL-XL) 24 hr tablet 50 mg, 50 mg, Oral, BID, Ojie, Jude, MD, 50 mg at 01/26/19 2112 .  metroNIDAZOLE (FLAGYL) IVPB 500 mg, 500 mg, Intravenous, Q8H, Benita Gutter, RPH, Last Rate: 100 mL/hr at 02/04/2019 0518, 500 mg at 01/28/2019 0518 .  mometasone-formoterol (DULERA) 200-5 MCG/ACT inhaler 2 puff, 2 puff, Inhalation, BID, Ojie, Jude, MD, 2 puff at 01/26/19 2153 .  montelukast (SINGULAIR) tablet 10 mg, 10 mg, Oral, QHS, Ojie, Jude, MD, 10 mg at 01/26/19 2112 .  multivitamin with minerals tablet 1 tablet, 1 tablet, Oral, Daily, Ojie, Jude, MD, 1 tablet at 01/26/19 0908 .  nitroGLYCERIN (NITROSTAT) SL tablet 0.4 mg, 0.4 mg, Sublingual, Q5 min PRN, Ojie, Jude, MD .   oxyCODONE (Oxy IR/ROXICODONE) immediate release tablet 10 mg, 10 mg, Oral, Q6H PRN, Ojie, Jude, MD .  polyethylene glycol (MIRALAX / GLYCOLAX) packet 17 g, 17 g, Oral, Daily, Ojie, Jude, MD, 17 g at 01/26/19 0910 .  potassium chloride SA (KLOR-CON) CR tablet 40 mEq, 40 mEq, Oral, Once, Tawnya Crook, RPH .  remdesivir 100 mg  in sodium chloride 0.9 % 250 mL IVPB, 100 mg, Intravenous, Q24H, Dallie Piles, RPH .  silver sulfADIAZINE (SILVADENE) 1 % cream 1 application, 1 application, Topical, Daily PRN, Ojie, Jude, MD .  tamsulosin (FLOMAX) capsule 0.4 mg, 0.4 mg, Oral, Daily, Ojie, Jude, MD, 0.4 mg at 01/26/19 0909 .  traMADol (ULTRAM) tablet 50 mg, 50 mg, Oral, BID, Ojie, Jude, MD, 50 mg at 01/26/19 2112 .  traZODone (DESYREL) tablet 100 mg, 100 mg, Oral, QHS, Ojie, Jude, MD, 100 mg at 01/26/19 2113 .  umeclidinium bromide (INCRUSE ELLIPTA) 62.5 MCG/INH 1 puff, 1 puff, Inhalation, Daily, Ojie, Jude, MD, 1 puff at 01/26/19 0900 .  vitamin C (ASCORBIC ACID) tablet 250 mg, 250 mg, Oral, BID, Ojie, Jude, MD, 250 mg at 01/26/19 2113 .  zinc sulfate capsule 220 mg, 220 mg, Oral, Daily, Awilda Bill, NP, 220 mg at 01/26/19 1554    ALLERGIES   Contrast media [iodinated diagnostic agents], Morphine and related, Niacin and related, Other, and Amlodipine    REVIEW OF SYSTEMS    ROS unable to obtain due to COVID precautions to preserve pPE  PHYSICAL EXAMINATION   Vital Signs: Temp:  [97.8 F (36.6 C)-98.6 F (37 C)] 97.8 F (36.6 C) (10/17 0500) Pulse Rate:  [59-88] 71 (10/17 0500) Resp:  [18-24] 23 (10/17 0500) BP: (90-151)/(53-88) 113/72 (10/17 0500) SpO2:  [91 %-99 %] 98 % (10/17 0735)  GENERAL:NAD HEAD: Normocephalic, atraumatic.  EYES: Pupils equal, round.  No scleral icterus.  MOUTH: Moist mucosal membrane. NECK: Supple. No thyromegaly. No nodules. No JVD.  PULMONARY non tachypneic CARDIOVASCULAR: telemetry reviewed GASTROINTESTINAL: non distended.   MUSCULOSKELETAL:skin intact.  NEUROLOGIC: Mild distress due to acute illness SKIN:intact,warm,dry  COVID-19 DISASTER DECLARATION:   FULL CONTACT PHYSICAL EXAMINATION WAS NOT POSSIBLE DUE TO TREATMENT OF COVID-19  AND CONSERVATION OF PERSONAL PROTECTIVE EQUIPMENT, LIMITED EXAM FINDINGS INCLUDE-    Patient assessed or the symptoms described in the history of present illness.  In the context of the Global COVID-19 pandemic, which necessitated consideration that the patient might be at risk for infection with the SARS-CoV-2 virus that causes COVID-19, Institutional protocols and algorithms that pertain to the evaluation of patients at risk for COVID-19 are in a state of rapid change based on information released by regulatory bodies including the CDC and federal and state organizations. These policies and algorithms were followed during the patient's care while in hospital.   PERTINENT DATA     Infusions: . ceFEPime (MAXIPIME) IV 2 g (01/26/19 2137)  . metronidazole 500 mg (02/05/2019 0518)  . remdesivir 100 mg in NS 250 mL     Scheduled Medications: . apixaban  2.5 mg Oral BID  . atorvastatin  40 mg Oral Daily  . brimonidine  1 drop Both Eyes Q12H   And  . timolol  1 drop Both Eyes BID  . Chlorhexidine Gluconate Cloth  6 each Topical Daily  . cholecalciferol  400 Units Oral Daily  . citalopram  10 mg Oral Daily  . collagenase   Topical Daily  . dexamethasone (DECADRON) injection  6 mg Intravenous Q24H  . docusate sodium  100 mg Oral BID  . dorzolamide  1 drop Both Eyes TID  . ezetimibe  10 mg Oral Daily  . famotidine  20 mg Oral Daily  . feeding supplement (PRO-STAT SUGAR FREE 64)  30 mL Oral TID WC  . finasteride  5 mg Oral Daily  . gabapentin  100 mg Oral TID  . influenza  vaccine adjuvanted  0.5 mL Intramuscular Tomorrow-1000  . insulin aspart  0-5 Units Subcutaneous QHS  . insulin aspart  0-9 Units Subcutaneous TID WC  . insulin aspart  8 Units Subcutaneous TID WC  .  insulin detemir  35 Units Subcutaneous Q2200  . latanoprost  1 drop Both Eyes QHS  . levothyroxine  50 mcg Oral Q0600  . metoprolol succinate  50 mg Oral BID  . mometasone-formoterol  2 puff Inhalation BID  . montelukast  10 mg Oral QHS  . multivitamin with minerals  1 tablet Oral Daily  . polyethylene glycol  17 g Oral Daily  . potassium chloride  40 mEq Oral Once  . tamsulosin  0.4 mg Oral Daily  . traMADol  50 mg Oral BID  . traZODone  100 mg Oral QHS  . umeclidinium bromide  1 puff Inhalation Daily  . vitamin C  250 mg Oral BID  . zinc sulfate  220 mg Oral Daily   PRN Medications: acetaminophen, albuterol, ALPRAZolam, colchicine, diclofenac sodium, magnesium citrate, nitroGLYCERIN, oxyCODONE, silver sulfADIAZINE Hemodynamic parameters:   Intake/Output: 10/16 0701 - 10/17 0700 In: 300 [P.O.:200; IV Piggyback:100] Out: 5027 [Urine:1815]  Ventilator  Settings:      LAB RESULTS:  Basic Metabolic Panel: Recent Labs  Lab 01/25/19 1408 01/26/19 0358 02/07/2019 0529  NA 139 141 144  K 3.4* 3.2* 3.7  CL 94* 101 106  CO2 31 27 28   GLUCOSE 166* 317* 90  BUN 44* 48* 54*  CREATININE 1.80* 1.80* 1.50*  CALCIUM 8.5* 8.4* 8.9  MG  --  2.1 2.2  PHOS  --   --  3.4   Liver Function Tests: Recent Labs  Lab 01/25/19 1408  AST 26  ALT 17  ALKPHOS 63  BILITOT 0.6  PROT 6.6  ALBUMIN 3.0*   No results for input(s): LIPASE, AMYLASE in the last 168 hours. No results for input(s): AMMONIA in the last 168 hours. CBC: Recent Labs  Lab 01/25/19 1408 01/26/19 0358 01/26/2019 0529  WBC 9.1 4.6 9.8  NEUTROABS 6.1  --   --   HGB 13.6 13.1 13.2  HCT 41.6 40.9 41.9  MCV 87.2 87.6 88.4  PLT 189 165 173   Cardiac Enzymes: No results for input(s): CKTOTAL, CKMB, CKMBINDEX, TROPONINI in the last 168 hours. BNP: Invalid input(s): POCBNP CBG: Recent Labs  Lab 01/26/19 0806 01/26/19 1236 01/26/19 1542 01/26/19 2109 01/31/2019 0805  GLUCAP 264* 295* 238* 128* 89     IMAGING  RESULTS:  Imaging: Dg Chest Port 1 View  Result Date: 01/25/2019 CLINICAL DATA:  Fever. Shortness of breath. EXAM: PORTABLE CHEST 1 VIEW COMPARISON:  10/13/2018 FINDINGS: There is cardiomegaly with pulmonary vascular congestion. There is haziness in the left perihilar region and in the right upper and lower lobes, probably representing mild pulmonary edema or possibly faint pneumonia. No discrete pleural effusions. No acute bone abnormality. Prominent degenerative changes of the right shoulder. IMPRESSION: 1. Hazy infiltrates in the left perihilar region and in the right upper and lower lobes. 2. Cardiomegaly with pulmonary vascular congestion. The hazy infiltrates could represent pulmonary edema or pneumonia. Electronically Signed   By: Lorriane Shire M.D.   On: 01/25/2019 14:46   Dg Knee Complete 4 Views Left  Result Date: 01/25/2019 CLINICAL DATA:  Fever. Soft tissue wounds on the left BKA stump. EXAM: LEFT KNEE - COMPLETE 4+ VIEW COMPARISON:  Radiographs dated 12/25/2017 FINDINGS: Surgical margins of the fibula and tibia are sharp with no radiographic evidence suggestive  of osteomyelitis. No significant arthritic changes of the knee. Vascular stent in the distal left thigh. IMPRESSION: No significant abnormality. Specifically, no evidence of osteomyelitis in the tibia or fibula. Electronically Signed   By: Lorriane Shire M.D.   On: 01/25/2019 14:48      ASSESSMENT AND PLAN    -Multidisciplinary rounds held today  Acute Hypoxic Respiratory Failure -due to COVID19 infection -Currently on RA - was noted to be desaturating to mid 80s intermittently -bronchopulmonary hygiene with IS at bedside and chest physiotherapy via bed -continue Bronchodilator Therapy -possible intercurrent acute exacerbation of COPD - continue dulera, absence of voluminous phlegm production/discolored phlegm    COVID 19 associated pneumonia -CXR as above with bilateral infiltrates -Remdesevir - pharmacy on case -  appreciate collaboration  - Actemra - will consider if appropriate per hospital policy- currently CRP is <10 -Vitamin C-250mg  bid -Dexamethasone - 6mg  iv daily      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 -10/16- creatinine improved d/c nonessential nephrotoxins -follow chem 7 -follow UO -continue Foley Catheter-assess need daily    OSA   - may use home CPAP device QHS    ID -continue IV abx as prescibed -follow up cultures   GI/Nutrition GI PROPHYLAXIS as indicated DIET-->TF's as tolerated Constipation protocol as indicated   ENDO - ICU hypoglycemic\Hyperglycemia protocol -check FSBS per protocol   ELECTROLYTES -follow labs as needed -replace as needed -pharmacy consultation   DVT/GI PRX ordered -SCDs -eliquis 2.5 bid  TRANSFUSIONS AS NEEDED MONITOR FSBS ASSESS the need for LABS as needed   Critical care provider statement:    Critical care time (minutes):  32   Critical care time was exclusive of:  Separately billable procedures and treating other patients   Critical care was necessary to treat or prevent imminent or life-threatening deterioration of the following conditions:  acute hypoxic respiratory failure, COVID 19 pneumonia, multiple comorbid conditions   Critical care was time spent personally by me on the following activities:  Development of treatment plan with patient or surrogate, discussions with consultants, evaluation of patient's response to treatment, examination of patient, obtaining history from patient or surrogate, ordering and performing treatments and interventions, ordering and review of laboratory studies and re-evaluation of patient's condition.  I assumed direction of critical care for this patient from another provider in my specialty: no    This document was prepared using Dragon voice recognition software and may include unintentional dictation errors.    Ottie Glazier, M.D.  Division of [place]

## 2019-01-27 NOTE — Consult Note (Signed)
[facility] for Electrolyte Monitoring and Replacement   Recent Labs: Potassium (mmol/L)  Date Value  02/01/2019 3.7  08/05/2014 3.6   Magnesium (mg/dL)  Date Value  01/15/2019 2.2  04/20/2014 2.1   Calcium (mg/dL)  Date Value  01/19/2019 8.9   Calcium, Total (mg/dL)  Date Value  08/05/2014 8.6 (L)   Albumin (g/dL)  Date Value  01/25/2019 3.0 (L)  05/02/2014 2.4 (L)   Phosphorus (mg/dL)  Date Value  01/23/2019 3.4  04/20/2014 3.0   Sodium (mmol/L)  Date Value  01/19/2019 144  02/21/2015 143  08/05/2014 137   Corrected Ca: 9.40 mg/dL  Assessment: 85 y.o.malewith a  history of CAD,chronic atrial fibrillation on anticoagulation with Eliquis,chronic diastolic CHF,COPD,CVA,diabetes mellitus and peripheral artery disease status post left BKA who presented to the emergency room with complaints of fevers, found to be SARS CoV-2 positive.  Goal of Therapy:  due to cardiac history: Potassium 4.0 - 5.1 mmol/L Magnesium 2.0 - 2.4 mg/dL Other electrolytes WNL  Plan:  Potassium 40 mEq PO x 1 dose. Follow up BMP with am labs. Will defer mag, phos until Monday.  Tawnya Crook ,PharmD Clinical Pharmacist 01/15/2019 7:58 AM

## 2019-01-27 NOTE — Progress Notes (Signed)
ANTICOAGULATION CONSULT NOTE - Initial Consult  Pharmacy Consult for Eliquis Indication: atrial fibrillation  Allergies  Allergen Reactions  . Contrast Media [Iodinated Diagnostic Agents] Shortness Of Breath  . Morphine And Related Other (See Comments)    Hallucinations   . Niacin And Related Dermatitis  . Other     Other reaction(s): SHORTNESS OF BREATH  . Amlodipine Rash    Patient Measurements: Height: 5\' 6"  (167.6 cm) Weight: 176 lb 5.9 oz (80 kg) IBW/kg (Calculated) : 63.8  Vital Signs: Temp: 97.8 F (36.6 C) (10/17 0945) Temp Source: Oral (10/17 0945) BP: 129/82 (10/17 1415) Pulse Rate: 86 (10/17 1415)  Labs: Recent Labs    01/25/19 1408 01/26/19 0358 01/26/2019 0529  HGB 13.6 13.1 13.2  HCT 41.6 40.9 41.9  PLT 189 165 173  CREATININE 1.80* 1.80* 1.50*    Estimated Creatinine Clearance: 35.8 mL/min (A) (by C-G formula based on SCr of 1.5 mg/dL (H)).   Medical History: Past Medical History:  Diagnosis Date  . Arthritis   . CAD    a. MI 01/29/1996 tx'd w/ TPA @ [place]; b. Myoview 06/2005: EF 50%, scar @ apex, mild peri-infarct ischemia  . Cancer ([place])    skin  . Chronic atrial fibrillation ([place])    a. since 2006; b. on warfarin  . Chronic diastolic CHF (congestive heart failure) ([place])    a. echo 04/2006: EF lower limits of nl, mod LVH, mild aortic root dilatation, & mild MR, biatrial enlargement; b. echo 04/2013: EF 60%, mod dilated LA, mild MR & TR, mod pulm HTN w/ RV systolic pressure 53, c. echo 04/21/14: EF 55-60%, unable to exclude WMA, severely dilated LA 6.6 cm, nl RVSP, mildly dilated aortic root  . COPD (chronic obstructive pulmonary disease) ([facility])    oxygen prn at home  . CVA [phone]   x2  . DM   . Falls   . GERD (gastroesophageal reflux disease)   . History of kidney stones   . HYPERLIPIDEMIA   . HYPERTENSION   . Kidney stone    a. s/p left ureteral stenting 04/24/14  . Left arm weakness    limited movement. S/P fall injury  . Neuropathy  of both feet   . Poor balance   . Wears dentures    full upper and lower    Medications:  Eliquis 5 mg bid PTA  Assessment: 85 y/o M with a h/o atrial fibrillation on Elilquis PTA admitted with acute on chronic respiratory failure 2/2 COVID-19 pneumonitis.   Plan:  Eliquis continued with reduced dose of 2.5 mg bid with SCr elevated but improving. Will f/u SCr and adjust accordingly.   Ulice Dash D 01/28/2019,3:35 PM

## 2019-01-28 LAB — COMPREHENSIVE METABOLIC PANEL
ALT: 16 U/L (ref 0–44)
AST: 15 U/L (ref 15–41)
Albumin: 2.4 g/dL — ABNORMAL LOW (ref 3.5–5.0)
Alkaline Phosphatase: 52 U/L (ref 38–126)
Anion gap: 9 (ref 5–15)
BUN: 50 mg/dL — ABNORMAL HIGH (ref 8–23)
CO2: 28 mmol/L (ref 22–32)
Calcium: 8.9 mg/dL (ref 8.9–10.3)
Chloride: 103 mmol/L (ref 98–111)
Creatinine, Ser: 1.4 mg/dL — ABNORMAL HIGH (ref 0.61–1.24)
GFR calc Af Amer: 53 mL/min — ABNORMAL LOW (ref >60)
GFR calc non Af Amer: 45 mL/min — ABNORMAL LOW (ref >60)
Glucose, Bld: 244 mg/dL — ABNORMAL HIGH (ref 70–99)
Potassium: 3.4 mmol/L — ABNORMAL LOW (ref 3.5–5.1)
Sodium: 140 mmol/L (ref 135–145)
Total Bilirubin: 0.5 mg/dL (ref 0.3–1.2)
Total Protein: 5.5 g/dL — ABNORMAL LOW (ref 6.5–8.1)

## 2019-01-28 LAB — CBC WITH DIFFERENTIAL/PLATELET
Abs Immature Granulocytes: 0.03 10*3/uL (ref 0.00–0.07)
Basophils Absolute: 0 10*3/uL (ref 0.0–0.1)
Basophils Relative: 0 %
Eosinophils Absolute: 0 10*3/uL (ref 0.0–0.5)
Eosinophils Relative: 0 %
HCT: 40.9 % (ref 39.0–52.0)
Hemoglobin: 12.7 g/dL — ABNORMAL LOW (ref 13.0–17.0)
Immature Granulocytes: 1 %
Lymphocytes Relative: 13 %
Lymphs Abs: 0.7 10*3/uL (ref 0.7–4.0)
MCH: 27.8 pg (ref 26.0–34.0)
MCHC: 31.1 g/dL (ref 30.0–36.0)
MCV: 89.5 fL (ref 80.0–100.0)
Monocytes Absolute: 0.5 10*3/uL (ref 0.1–1.0)
Monocytes Relative: 9 %
Neutro Abs: 4.1 10*3/uL (ref 1.7–7.7)
Neutrophils Relative %: 77 %
Platelets: 165 10*3/uL (ref 150–400)
RBC: 4.57 MIL/uL (ref 4.22–5.81)
RDW: 15.9 % — ABNORMAL HIGH (ref 11.5–15.5)
WBC: 5.3 10*3/uL (ref 4.0–10.5)
nRBC: 0 % (ref 0.0–0.2)

## 2019-01-28 LAB — BRAIN NATRIURETIC PEPTIDE: B Natriuretic Peptide: 797.6 pg/mL — ABNORMAL HIGH (ref 0.0–100.0)

## 2019-01-28 LAB — ABO/RH: ABO/RH(D): A POS

## 2019-01-28 LAB — C-REACTIVE PROTEIN: CRP: 1.6 mg/dL — ABNORMAL HIGH (ref <1.0)

## 2019-01-28 LAB — GLUCOSE, CAPILLARY
Glucose-Capillary: 219 mg/dL — ABNORMAL HIGH (ref 70–99)
Glucose-Capillary: 319 mg/dL — ABNORMAL HIGH (ref 70–99)
Glucose-Capillary: 291 mg/dL — ABNORMAL HIGH (ref 70–99)

## 2019-01-28 LAB — D-DIMER, QUANTITATIVE: D-Dimer, Quant: 0.42 ug/mL-FEU (ref 0.00–0.50)

## 2019-01-28 LAB — MAGNESIUM: Magnesium: 2.2 mg/dL (ref 1.7–2.4)

## 2019-01-28 LAB — TSH: TSH: 0.709 u[IU]/mL (ref 0.350–4.500)

## 2019-01-28 MED ORDER — POTASSIUM CHLORIDE CRYS ER 20 MEQ PO TBCR
40.0000 meq | EXTENDED_RELEASE_TABLET | Freq: Once | ORAL | Status: AC
  Administered 2019-01-28: 40 meq via ORAL
  Filled 2019-01-28: qty 2

## 2019-01-28 MED ORDER — METHYLPREDNISOLONE SODIUM SUCC 40 MG IJ SOLR
40.0000 mg | Freq: Every day | INTRAMUSCULAR | Status: DC
  Administered 2019-01-28 – 2019-01-29 (×2): 40 mg via INTRAVENOUS
  Filled 2019-01-28 (×3): qty 1

## 2019-01-28 MED ORDER — METOPROLOL SUCCINATE ER 25 MG PO TB24
25.0000 mg | ORAL_TABLET | Freq: Two times a day (BID) | ORAL | Status: DC
  Administered 2019-01-28 – 2019-01-31 (×7): 25 mg via ORAL
  Filled 2019-01-28 (×8): qty 1

## 2019-01-28 MED ORDER — APIXABAN 5 MG PO TABS
5.0000 mg | ORAL_TABLET | Freq: Two times a day (BID) | ORAL | Status: DC
  Administered 2019-01-28 – 2019-01-31 (×6): 5 mg via ORAL
  Filled 2019-01-28 (×6): qty 1

## 2019-01-28 NOTE — Progress Notes (Signed)
ANTICOAGULATION CONSULT NOTE - Follow Up Consult  Pharmacy Consult for Eliquis Indication: atrial fibrillation  Allergies  Allergen Reactions  . Contrast Media [Iodinated Diagnostic Agents] Shortness Of Breath  . Morphine And Related Other (See Comments)    Hallucinations   . Niacin And Related Dermatitis  . Other     Other reaction(s): SHORTNESS OF BREATH  . Amlodipine Rash    Patient Measurements: Height: 5\' 6"  (167.6 cm) Weight: 176 lb 5.9 oz (80 kg) IBW/kg (Calculated) : 63.8  Vital Signs: Temp: 97.8 F (36.6 C) (10/18 0758) Temp Source: Oral (10/18 0758) BP: 144/77 (10/18 0758) Pulse Rate: 100 (10/18 0758)  Labs: Recent Labs    01/26/19 0358 02/07/2019 0529 01/28/19 0440  HGB 13.1 13.2 12.7*  HCT 40.9 41.9 40.9  PLT 165 173 165  CREATININE 1.80* 1.50* 1.40*    Estimated Creatinine Clearance: 38.4 mL/min (A) (by C-G formula based on SCr of 1.4 mg/dL (H)).   Medical History: Past Medical History:  Diagnosis Date  . Arthritis   . CAD    a. MI 01/29/1996 tx'd w/ TPA @ [place] [facility]; b. Myoview 06/2005: EF 50%, scar @ apex, mild peri-infarct ischemia  . Cancer ([place])    skin  . Chronic atrial fibrillation ([place])    a. since 2006; b. on warfarin  . Chronic diastolic CHF (congestive heart failure) ([place])    a. echo 04/2006: EF lower limits of nl, mod LVH, mild aortic root dilatation, & mild MR, biatrial enlargement; b. echo 04/2013: EF 60%, mod dilated LA, mild MR & TR, mod pulm HTN w/ RV systolic pressure 53, c. echo 04/21/14: EF 55-60%, unable to exclude WMA, severely dilated LA 6.6 cm, nl RVSP, mildly dilated aortic root  . COPD (chronic obstructive pulmonary disease) ([facility])    oxygen prn at home  . CVA 0940,7680   x2  . DM   . Falls   . GERD (gastroesophageal reflux disease)   . History of kidney stones   . HYPERLIPIDEMIA   . HYPERTENSION   . Kidney stone    a. s/p left ureteral stenting 04/24/14  . Left arm weakness    limited movement. S/P fall injury  .  Neuropathy of both feet   . Poor balance   . Wears dentures    full upper and lower    Medications:  Eliquis 5 mg bid PTA  Assessment: 85 y/o M with a h/o atrial fibrillation on Elilquis PTA admitted with acute on chronic respiratory failure 2/2 COVID-19 pneumonitis.   Of note, patient's SCr has improved to 1.4 today. H/H and Plt wnl   Plan:  Increase Eliquis back to home dose of 5 mg twice daily   Albertina Parr, PharmD., BCPS Clinical Pharmacist Clinical phone for 01/28/19 until 5pm: [phone]

## 2019-01-28 NOTE — Progress Notes (Addendum)
PROGRESS NOTE                                                                                                                                                                                                             Patient Demographics:    [name], is a 85 y.o. male, DOB - November 07, 1931, JOI:[id number]  Outpatient Primary MD for the patient is Jearld Fenton, NP    LOS - 1  Admit date - 01/28/2019    CC - Cough     Brief Narrative   [name]  is a 85 y.o. male,  with a known history of CAD,Chronic atrial fibrillation on Eliquis,Chronic diastolic CHF EF 47% in 0962,EZMO on PRN 02 at home,CVA x 2,DM 2 and PAD status post left BKA in June 2947 complicated by a stump Ulcer at [facility] who presented to the emergency room with complaints of fevers and was admitted for CAP at [facility], later Covid 19 came back +ve and was sent to [facility]. Currently mild SOB on 2 lits o2, no other complaints.   Subjective:    [name] today has, No headache, No chest pain, No abdominal pain - No Nausea, No new weakness tingling or numbness, No Cough - SOB.     Assessment  & Plan :     1.  Acute on chronic hypoxic respiratory failure due to acute COVID-19 pneumonitis.  Patient is on as needed 2 L nasal cannula oxygen at home due to chronic diastolic CHF and COPD.  His COVID-19 infection is moderate in intensity, he has been placed on IV steroids along with IV Remdisvir. Clinically stable, start tapering steroids.  Continue full supportive care.   COVID-19 Labs  Recent Labs    01/26/19 0358 01/26/19 0713 01/28/19 0440  DDIMER  --   --  0.42  FERRITIN  --  260  --   CRP 7.1*  --  1.6*    Lab Results  Component Value Date   SARSCOV2NAA POSITIVE (A) 01/25/2019   SARSCOV2NAA NEGATIVE 10/12/2018   SARSCOV2NAA NEGATIVE 10/03/2018   [place] NEGATIVE 09/10/2018     SpO2: 95 % O2 Flow Rate (L/min): 2 L/min  Hepatic  Function Latest Ref Rng & Units 01/28/2019 01/25/2019 10/10/2018  Total Protein 6.5 - 8.1 g/dL 5.5(L) 6.6 6.3(L)  Albumin 3.5 - 5.0 g/dL 2.4(L) 3.0(L)  2.7(L)  AST 15 - 41 U/L 15 26 14(L)  ALT 0 - 44 U/L _0 Alk Phosphatase 38 - 126 U/L 52 63 73  Total Bilirubin 0.3 - 1.2 mg/dL 0.5 0.6 0.4        Component Value Date/Time   BNP 797.6 (H) 01/28/2019 0440      2.  Chronic diastolic CHF.  EF 60% on echocardiogram in 2016.  Currently compensated.  now on Lower than dose beta-blocker due to resting bradycardia.  3.  CAD.  No acute issues home dose beta-blocker, statin & Zetia will be continued for secondary prevention.  4.  Chronic atrial fibrillation.  Mali vas 2 score of at least 3.  Continue combination of oral beta-blocker and Eliquis.  Pharmacy to dose.  PRN IV Lopressor also ordered.  5.  Hypertension.  Stable on beta-blocker, dose cut due to bradyycardia.  6.  Dyslipidemia.  Stable on combination of statin and Zetia.  7.  Recent left BKA. Came with Left BKA and right heel ulcers which he came with at [facility].  Wound care will be consulted.  No signs of active infection.  See pictures aboveSupportive care.  PT OT.  8.  COPD.  On as needed home oxygen.  No acute issues supportive care.  9.  Diabetic peripheral neuropathy.  Continue Celexa and Neurontin.    10.  AKI.  Baseline creatinine around 1.2.  Avoid nephrotoxins and monitor.  Renal function seems to be improving.  This happened at [facility].    11.  OSA.  Oxygen at night at [facility].    12. Hypothyroidism - check TSH due to Bradycardia, on Synthroid.  DM type II.  On Lantus and sliding scale.  Pre-meal NovoLog added.  Recent A1c suggests stable outpatient control.  Monitor and adjust  Lab Results  Component Value Date   HGBA1C 6.7 (H) 01/26/2019   CBG (last 3)  Recent Labs    01/11/2019 1606 01/18/2019 2116 01/28/19 0701  GLUCAP 299* 271* 219*    Condition -  Guarded  Family Communication  :  Called listed number for wife on 01/28/2019 at 10 AM.  No response.  Code Status :  Full  Diet :   Diet Order            Diet heart healthy/carb modified Room service appropriate? Yes; Fluid consistency: Thin  Diet effective now               Disposition Plan  :  SNF  Consults  : None  Procedures  :     PUD Prophylaxis :   DVT Prophylaxis  :  Eliquis  Lab Results  Component Value Date   PLT 165 01/28/2019    Inpatient Medications  Scheduled Meds:  apixaban  2.5 mg Oral BID   atorvastatin  40 mg Oral q1800   chlorhexidine  15 mL Mouth Rinse BID   citalopram  10 mg Oral Daily   collagenase   Topical Daily   docusate sodium  100 mg Oral BID   dorzolamide  1 drop Both Eyes TID   ezetimibe  10 mg Oral Daily   finasteride  5 mg Oral Daily   gabapentin  100 mg Oral TID   insulin aspart  0-15 Units Subcutaneous TID WC   insulin aspart  0-5 Units Subcutaneous QHS   insulin aspart  3 Units Subcutaneous TID WC   insulin glargine  30 Units Subcutaneous BID   latanoprost  1 drop  Both Eyes QHS   levothyroxine  50 mcg Oral QAC breakfast   mouth rinse  15 mL Mouth Rinse q12n4p   methylPREDNISolone (SOLU-MEDROL) injection  60 mg Intravenous Q12H   metoprolol succinate  25 mg Oral BID   potassium chloride  40 mEq Oral Once   sodium chloride flush  3 mL Intravenous Q12H   tamsulosin  0.4 mg Oral QPC supper   traZODone  100 mg Oral QHS   vitamin C  250 mg Oral BID   Continuous Infusions:  remdesivir 100 mg in NS 250 mL     PRN Meds:.acetaminophen, ALPRAZolam, magnesium citrate, metoprolol tartrate, nitroGLYCERIN, [DISCONTINUED] ondansetron **OR** ondansetron (ZOFRAN) IV  Antibiotics  :    Anti-infectives (From admission, onward)   Start     Dose/Rate Route Frequency Ordered Stop   01/28/19 1000  remdesivir 100 mg in sodium chloride 0.9 % 250 mL IVPB     100 mg 500 mL/hr over 30 Minutes Intravenous Every 24 hours 01/30/2019 1143 01/31/19 0959    01/15/2019 1300  remdesivir 100 mg in sodium chloride 0.9 % 250 mL IVPB     100 mg 500 mL/hr over 30 Minutes Intravenous Every 24 hours 01/24/2019 1143 01/11/2019 1400       Time Spent in minutes  30   Lala Lund M.D on 01/28/2019 at 8:32 AM  To page go to [email or web address] - password [email or web address]  [facility] -  Office  [phone]     See all Orders from today for further details    Objective:   Vitals:   02/06/2019 2212 01/28/19 0000 01/28/19 0302 01/28/19 0758  BP: 109/63 128/65 136/69 (!) 144/77  Pulse: 72  63 100  Resp: (!) _0 Temp:  97.6 F (36.4 C) 98 F (36.7 C) 97.8 F (36.6 C)  TempSrc:  Axillary Axillary Oral  SpO2: 96%  97% 95%  Weight:      Height:        Wt Readings from Last 3 Encounters:  01/19/2019 80 kg  01/26/19 80.9 kg  12/26/18 78 kg     Intake/Output Summary (Last 24 hours) at 01/28/2019 0832 Last data filed at 01/28/2019 0700 Gross per 24 hour  Intake 490 ml  Output 1121 ml  Net -631 ml     Physical Exam  Awake Alert,  No new F.N deficits, Normal affect [place].AT,PERRAL Supple Neck,No JVD, No cervical lymphadenopathy appriciated.  Symmetrical Chest wall movement, Good air movement bilaterally, CTAB RRR,No Gallops,Rubs or new Murmurs, No Parasternal Heave +ve B.Sounds, Abd Soft, No tenderness, No organomegaly appriciated, No rebound - guarding or rigidity. No Cyanosis, Clubbing or edema,  L. BKA   L. BKA Stump     R.Heel \   Data Review:    CBC Recent Labs  Lab 01/25/19 1408 01/26/19 0358 02/10/2019 0529 01/28/19 0440  WBC 9.1 4.6 9.8 5.3  HGB 13.6 13.1 13.2 12.7*  HCT 41.6 40.9 41.9 40.9  PLT 189 165 173 165  MCV 87.2 87.6 88.4 89.5  MCH 28.5 28.1 27.8 27.8  MCHC 32.7 32.0 31.5 31.1  RDW 16.1* 16.1* 15.9* 15.9*  LYMPHSABS 2.1  --   --  0.7  MONOABS 0.8  --   --  0.5  EOSABS 0.0  --   --  0.0  BASOSABS 0.0  --   --  0.0    Chemistries  Recent Labs  Lab 01/25/19 1408 01/26/19 0358 02/04/2019 0529  01/28/19 0440  NA 139 141 144 140  K 3.4* 3.2* 3.7 3.4*  CL 94* 101 106 103  CO2 _0 GLUCOSE 166* 317* 90 244*  BUN 44* 48* 54* 50*  CREATININE 1.80* 1.80* 1.50* 1.40*  CALCIUM 8.5* 8.4* 8.9 8.9  MG  --  2.1 2.2 2.2  AST 26  --   --  15  ALT 17  --   --  16  ALKPHOS 63  --   --  52  BILITOT 0.6  --   --  0.5   ------------------------------------------------------------------------------------------------------------------ No results for input(s): CHOL, HDL, LDLCALC, TRIG, CHOLHDL, LDLDIRECT in the last 72 hours.  Lab Results  Component Value Date   HGBA1C 6.7 (H) 01/26/2019   ------------------------------------------------------------------------------------------------------------------ Recent Labs    01/26/19 0358  TSH 1.392    Cardiac Enzymes No results for input(s): CKMB, TROPONINI, MYOGLOBIN in the last 168 hours.  Invalid input(s): CK ------------------------------------------------------------------------------------------------------------------    Component Value Date/Time   BNP 797.6 (H) 01/28/2019 0440    Micro Results Recent Results (from the past 240 hour(s))  Urine Culture     Status: None   Collection Time: 01/22/19 11:59 AM   Specimen: Urine  Result Value Ref Range Status   MICRO NUMBER: [id number]  Final   SPECIMEN QUALITY: Adequate  Final   Sample Source URINE  Final   STATUS: FINAL  Final   Result: No Growth  Final  Blood Culture (routine x 2)     Status: None (Preliminary result)   Collection Time: 01/25/19  2:08 PM   Specimen: BLOOD  Result Value Ref Range Status   Specimen Description BLOOD BLOOD LEFT HAND  Final   Special Requests   Final    BOTTLES DRAWN AEROBIC AND ANAEROBIC Blood Culture adequate volume   Culture   Final    NO GROWTH 3 DAYS Performed at [facility], [address] [place], [place] [address]    Report Status PENDING  Incomplete  Blood Culture (routine x 2)     Status: None (Preliminary  result)   Collection Time: 01/25/19  2:09 PM   Specimen: BLOOD  Result Value Ref Range Status   Specimen Description BLOOD BLOOD LEFT FOREARM  Final   Special Requests   Final    BOTTLES DRAWN AEROBIC AND ANAEROBIC Blood Culture adequate volume   Culture   Final    NO GROWTH 3 DAYS Performed at [facility], [address] [place], [place] [address]    Report Status PENDING  Incomplete  SARS CORONAVIRUS 2 (TAT 6-24 HRS) Nasopharyngeal Nasopharyngeal Swab     Status: Abnormal   Collection Time: 01/25/19  2:09 PM   Specimen: Nasopharyngeal Swab  Result Value Ref Range Status   SARS Coronavirus 2 POSITIVE (A) NEGATIVE Final    Comment: RESULT CALLED TO, READ BACK BY AND VERIFIED WITH: RN B MILLER @ 0149 01/26/19 BY S GEZAHEGN (NOTE) SARS-CoV-2 target nucleic acids are DETECTED. The SARS-CoV-2 RNA is generally detectable in upper and lower respiratory specimens during the acute phase of infection. Positive results are indicative of active infection with SARS-CoV-2. Clinical  correlation with patient history and other diagnostic information is necessary to determine patient infection status. Positive results do  not rule out bacterial infection or co-infection with other viruses. The expected result is Negative. Fact Sheet for Patients: SugarRoll.be Fact Sheet for Healthcare Providers: https://www.woods-mathews.com/ This test is not yet approved or cleared by the [place] FDA and  has been authorized for detection and/or diagnosis of SARS-CoV-2  by FDA under an Emergency Use Authorization (EUA). This EUA will remain  in effect (meaning this test can be used)  for the duration of the COVID-19 declaration under Section 564(b)(1) of the Act, 21 U.S.C. section 360bbb-3(b)(1), unless the authorization is terminated or revoked sooner. Performed at [facility], [place] [address], [place], [place] [address]   Urine culture      Status: None   Collection Time: 01/25/19  4:16 PM   Specimen: In/Out Cath Urine  Result Value Ref Range Status   Specimen Description   Final    IN/OUT CATH URINE Performed at [facility], [address] [place], [place] [address]    Special Requests   Final    NONE Performed at [facility], [address] [place], [place] [address]    Culture   Final    NO GROWTH Performed at [facility], [place] [address] [place], [place] [address]    Report Status 01/26/2019 FINAL  Final  MRSA PCR Screening     Status: None   Collection Time: 01/26/19  3:52 AM   Specimen: Nasopharyngeal  Result Value Ref Range Status   MRSA by PCR NEGATIVE NEGATIVE Final    Comment:        The GeneXpert MRSA Assay (FDA approved for NASAL specimens only), is one component of a comprehensive MRSA colonization surveillance program. It is not intended to diagnose MRSA infection nor to guide or monitor treatment for MRSA infections. Performed at [facility], [address] [place], [place] [address]     Radiology Reports Dg Chest Time 1 View  Result Date: 01/25/2019 CLINICAL DATA:  Fever. Shortness of breath. EXAM: PORTABLE CHEST 1 VIEW COMPARISON:  10/13/2018 FINDINGS: There is cardiomegaly with pulmonary vascular congestion. There is haziness in the left perihilar region and in the right upper and lower lobes, probably representing mild pulmonary edema or possibly faint pneumonia. No discrete pleural effusions. No acute bone abnormality. Prominent degenerative changes of the right shoulder. IMPRESSION: 1. Hazy infiltrates in the left perihilar region and in the right upper and lower lobes. 2. Cardiomegaly with pulmonary vascular congestion. The hazy infiltrates could represent pulmonary edema or pneumonia. Electronically Signed   By: Lorriane Shire M.D.   On: 01/25/2019 14:46   Dg Knee Complete 4 Views Left  Result Date: 01/25/2019 CLINICAL DATA:  Fever. Soft  tissue wounds on the left BKA stump. EXAM: LEFT KNEE - COMPLETE 4+ VIEW COMPARISON:  Radiographs dated 12/25/2017 FINDINGS: Surgical margins of the fibula and tibia are sharp with no radiographic evidence suggestive of osteomyelitis. No significant arthritic changes of the knee. Vascular stent in the distal left thigh. IMPRESSION: No significant abnormality. Specifically, no evidence of osteomyelitis in the tibia or fibula. Electronically Signed   By: Lorriane Shire M.D.   On: 01/25/2019 14:48   Vas [place] Burnard Bunting With/wo Tbi  Result Date: 01/16/2019 LOWER EXTREMITY DOPPLER STUDY Indications: Peripheral artery disease, and Left leg venous stasis ulcers and              left BKA.  Comparison Study: 05/31/2018 Performing Technologist: Concha Norway RVT  Examination Guidelines: A complete evaluation includes at minimum, Doppler waveform signals and systolic blood pressure reading at the level of bilateral brachial, anterior tibial, and posterior tibial arteries, when vessel segments are accessible. Bilateral testing is considered an integral part of a complete examination. Photoelectric Plethysmograph (PPG) waveforms and toe systolic pressure readings are included as required and additional  duplex testing as needed. Limited examinations for reoccurring indications may be performed as noted.  ABI Findings: +---------+------------------+-----+----------+--------+  Right     Rt Pressure (mmHg) Index Waveform   Comment   +---------+------------------+-----+----------+--------+  Brachial  157                                           +---------+------------------+-----+----------+--------+  ATA       140                0.89  monophasic           +---------+------------------+-----+----------+--------+  PTA       166                1.06  biphasic             +---------+------------------+-----+----------+--------+  Great Toe 128                0.82  Normal               +---------+------------------+-----+----------+--------+  +--------+------------------+-----+--------+-------+  Left     Lt Pressure (mmHg) Index Waveform Comment  +--------+------------------+-----+--------+-------+  Brachial 157                                        +--------+------------------+-----+--------+-------+  ATA                                        BKA      +--------+------------------+-----+--------+-------+  PTA                                        BKA      +--------+------------------+-----+--------+-------+ +-------+-----------+-----------+------------+------------+  ABI/TBI Today's ABI Today's TBI Previous ABI Previous TBI  +-------+-----------+-----------+------------+------------+  Right   1.06        .82                                    +-------+-----------+-----------+------------+------------+  Left    BKA                                                +-------+-----------+-----------+------------+------------+ Right ABIs appear essentially unchanged compared to prior study on 05/31/2018.  Summary: Right: Resting right ankle-brachial index is within normal range. No evidence of significant right lower extremity arterial disease. The right toe-brachial index is normal. Left: BKA.  *See table(s) above for measurements and observations.  Electronically signed by Leotis Pain MD on 01/16/2019 at 4:53:34 PM.   Final

## 2019-01-29 ENCOUNTER — Inpatient Hospital Stay (HOSPITAL_COMMUNITY): Payer: Medicare Other

## 2019-01-29 ENCOUNTER — Ambulatory Visit: Payer: Medicare Other | Admitting: Physician Assistant

## 2019-01-29 LAB — CBC WITH DIFFERENTIAL/PLATELET
Abs Immature Granulocytes: 0.09 10*3/uL — ABNORMAL HIGH (ref 0.00–0.07)
Basophils Absolute: 0 10*3/uL (ref 0.0–0.1)
Basophils Relative: 0 %
Eosinophils Absolute: 0 10*3/uL (ref 0.0–0.5)
Eosinophils Relative: 0 %
HCT: 46.4 % (ref 39.0–52.0)
Hemoglobin: 14.2 g/dL (ref 13.0–17.0)
Immature Granulocytes: 1 %
Lymphocytes Relative: 11 %
Lymphs Abs: 1.6 10*3/uL (ref 0.7–4.0)
MCH: 27.7 pg (ref 26.0–34.0)
MCHC: 30.6 g/dL (ref 30.0–36.0)
MCV: 90.6 fL (ref 80.0–100.0)
Monocytes Absolute: 1.6 10*3/uL — ABNORMAL HIGH (ref 0.1–1.0)
Monocytes Relative: 12 %
Neutro Abs: 10.8 10*3/uL — ABNORMAL HIGH (ref 1.7–7.7)
Neutrophils Relative %: 76 %
Platelets: 216 10*3/uL (ref 150–400)
RBC: 5.12 MIL/uL (ref 4.22–5.81)
RDW: 15.9 % — ABNORMAL HIGH (ref 11.5–15.5)
WBC: 14.1 10*3/uL — ABNORMAL HIGH (ref 4.0–10.5)
nRBC: 0 % (ref 0.0–0.2)

## 2019-01-29 LAB — GLUCOSE, CAPILLARY
Glucose-Capillary: 127 mg/dL — ABNORMAL HIGH (ref 70–99)
Glucose-Capillary: 224 mg/dL — ABNORMAL HIGH (ref 70–99)
Glucose-Capillary: 126 mg/dL — ABNORMAL HIGH (ref 70–99)
Glucose-Capillary: 119 mg/dL — ABNORMAL HIGH (ref 70–99)
Glucose-Capillary: 155 mg/dL — ABNORMAL HIGH (ref 70–99)
Glucose-Capillary: 243 mg/dL — ABNORMAL HIGH (ref 70–99)
Glucose-Capillary: 248 mg/dL — ABNORMAL HIGH (ref 70–99)

## 2019-01-29 LAB — COMPREHENSIVE METABOLIC PANEL
ALT: 16 U/L (ref 0–44)
AST: 16 U/L (ref 15–41)
Albumin: 2.6 g/dL — ABNORMAL LOW (ref 3.5–5.0)
Alkaline Phosphatase: 55 U/L (ref 38–126)
Anion gap: 9 (ref 5–15)
BUN: 48 mg/dL — ABNORMAL HIGH (ref 8–23)
CO2: 31 mmol/L (ref 22–32)
Calcium: 9.4 mg/dL (ref 8.9–10.3)
Chloride: 103 mmol/L (ref 98–111)
Creatinine, Ser: 1.25 mg/dL — ABNORMAL HIGH (ref 0.61–1.24)
GFR calc Af Amer: >60 mL/min (ref >60)
GFR calc non Af Amer: 52 mL/min — ABNORMAL LOW (ref >60)
Glucose, Bld: 151 mg/dL — ABNORMAL HIGH (ref 70–99)
Potassium: 3.5 mmol/L (ref 3.5–5.1)
Sodium: 143 mmol/L (ref 135–145)
Total Bilirubin: 0.5 mg/dL (ref 0.3–1.2)
Total Protein: 6.1 g/dL — ABNORMAL LOW (ref 6.5–8.1)

## 2019-01-29 LAB — D-DIMER, QUANTITATIVE: D-Dimer, Quant: 0.62 ug/mL-FEU — ABNORMAL HIGH (ref 0.00–0.50)

## 2019-01-29 LAB — C-REACTIVE PROTEIN: CRP: 0.9 mg/dL (ref <1.0)

## 2019-01-29 LAB — BRAIN NATRIURETIC PEPTIDE: B Natriuretic Peptide: 796.3 pg/mL — ABNORMAL HIGH (ref 0.0–100.0)

## 2019-01-29 LAB — MAGNESIUM: Magnesium: 2.4 mg/dL (ref 1.7–2.4)

## 2019-01-29 LAB — PROCALCITONIN: Procalcitonin: <0.1 ng/mL

## 2019-01-29 MED ORDER — INSULIN ASPART 100 UNIT/ML ~~LOC~~ SOLN
7.0000 [IU] | Freq: Three times a day (TID) | SUBCUTANEOUS | Status: DC
  Administered 2019-01-30 – 2019-02-01 (×7): 7 [IU] via SUBCUTANEOUS

## 2019-01-29 MED ORDER — INSULIN GLARGINE 100 UNIT/ML ~~LOC~~ SOLN
20.0000 [IU] | Freq: Every day | SUBCUTANEOUS | Status: AC
  Administered 2019-01-29: 20 [IU] via SUBCUTANEOUS
  Filled 2019-01-29: qty 0.2

## 2019-01-29 MED ORDER — METHYLPREDNISOLONE SODIUM SUCC 40 MG IJ SOLR
40.0000 mg | Freq: Two times a day (BID) | INTRAMUSCULAR | Status: DC
  Administered 2019-01-29 – 2019-01-31 (×4): 40 mg via INTRAVENOUS
  Filled 2019-01-29 (×4): qty 1

## 2019-01-29 MED ORDER — INSULIN GLARGINE 100 UNIT/ML ~~LOC~~ SOLN
35.0000 [IU] | Freq: Two times a day (BID) | SUBCUTANEOUS | Status: DC
  Administered 2019-01-30 – 2019-02-01 (×5): 35 [IU] via SUBCUTANEOUS
  Filled 2019-01-29 (×7): qty 0.35

## 2019-01-29 MED ORDER — NITROGLYCERIN 2 % TD OINT
0.5000 [in_us] | TOPICAL_OINTMENT | Freq: Four times a day (QID) | TRANSDERMAL | Status: DC
  Administered 2019-01-29 – 2019-01-30 (×2): 0.5 [in_us] via TOPICAL
  Filled 2019-01-29: qty 30

## 2019-01-29 MED ORDER — FUROSEMIDE 10 MG/ML IJ SOLN
20.0000 mg | Freq: Once | INTRAMUSCULAR | Status: AC
  Administered 2019-01-29: 20 mg via INTRAVENOUS
  Filled 2019-01-29: qty 2

## 2019-01-29 MED ORDER — SODIUM CHLORIDE 0.9 % IV SOLN
3.0000 g | Freq: Four times a day (QID) | INTRAVENOUS | Status: AC
  Administered 2019-01-29 – 2019-02-01 (×12): 3 g via INTRAVENOUS
  Filled 2019-01-29 (×2): qty 8
  Filled 2019-01-29: qty 3
  Filled 2019-01-29 (×5): qty 8
  Filled 2019-01-29: qty 3
  Filled 2019-01-29 (×3): qty 8

## 2019-01-29 MED ORDER — ALBUTEROL SULFATE HFA 108 (90 BASE) MCG/ACT IN AERS
1.0000 | INHALATION_SPRAY | RESPIRATORY_TRACT | Status: DC | PRN
  Administered 2019-01-29: 2 via RESPIRATORY_TRACT
  Filled 2019-01-29: qty 6.7

## 2019-01-29 MED ORDER — POTASSIUM CHLORIDE CRYS ER 20 MEQ PO TBCR
20.0000 meq | EXTENDED_RELEASE_TABLET | Freq: Once | ORAL | Status: AC
  Administered 2019-01-29: 20 meq via ORAL
  Filled 2019-01-29: qty 1

## 2019-01-29 MED ORDER — MONTELUKAST SODIUM 10 MG PO TABS
10.0000 mg | ORAL_TABLET | Freq: Every day | ORAL | Status: DC
  Administered 2019-01-29 – 2019-01-30 (×2): 10 mg via ORAL
  Filled 2019-01-29 (×3): qty 1

## 2019-01-29 MED ORDER — FUROSEMIDE 10 MG/ML IJ SOLN
60.0000 mg | Freq: Once | INTRAMUSCULAR | Status: AC
  Administered 2019-01-29: 60 mg via INTRAVENOUS
  Filled 2019-01-29: qty 6

## 2019-01-29 MED ORDER — POTASSIUM CHLORIDE CRYS ER 20 MEQ PO TBCR
40.0000 meq | EXTENDED_RELEASE_TABLET | Freq: Once | ORAL | Status: AC
  Administered 2019-01-29: 40 meq via ORAL
  Filled 2019-01-29: qty 2

## 2019-01-29 NOTE — Progress Notes (Addendum)
PROGRESS NOTE                                                                                                                                                                                                             Patient Demographics:    [name], is a 85 y.o. male, DOB - 1931-07-15, TKZ:[id number]  Outpatient Primary MD for the patient is Jearld Fenton, NP    LOS - 2  Admit date - 01/23/2019    CC - Cough     Brief Narrative   [name]  is a 85 y.o. male,  with a known history of CAD,Chronic atrial fibrillation on Eliquis,Chronic diastolic CHF EF 57% in 3220,URKY on PRN 02 at home,CVA x 2,DM 2 and PAD status post left BKA in June 7062 complicated by a stump Ulcer at [facility] who presented to the emergency room with complaints of fevers and was admitted for CAP at [facility], later Covid 19 came back +ve and was sent to [facility]. Currently mild SOB on 2 lits o2, no other complaints.   Subjective:   Patient in bed, appears comfortable, denies any headache, no fever, no chest pain or pressure, mild shortness of breath , no abdominal pain. No focal weakness.    Assessment  & Plan :     1.  Acute on chronic hypoxic respiratory failure due to acute COVID-19 pneumonitis.  Patient is on as needed 2 L nasal cannula oxygen at home due to chronic diastolic CHF and COPD.  His COVID-19 infection is moderate in intensity, he has been placed on IV steroids along with IV Remdisvir.   Continue full supportive care.  Extremely poor baseline with very fragile status and multiple comorbidities.  Prognosis is poor.  Note on 01/29/2019 he is more short of breath, proBNP is elevated and chest x-ray shows worsening of infiltrates right more than left.  IV Lasix, soft diet, speech eval to rule out aspiration.  IV Unasyn.  Follow procalcitonin and BNP.  Patient was seen total 4 times today 3 by me and once by my partner Dr. Bonner Puna.  He is  close to his baseline and symptom-free, he was also seen by PT he participated well this evening along with speech around 3:30 PM.  I also discussed his clinical scenario again by his nurse Delilah Shan on 5:25 PM.  Patient seems to be doing well has had good urine output and response to IV Lasix.  He will be monitored closely.  He is clinically improved but overall condition remains tenuous due to his multiple comorbidities and advanced age.    COVID-19 Labs  Recent Labs    01/28/19 0440 01/29/19 0440  DDIMER 0.42 0.62*  CRP 1.6* 0.9    Lab Results  Component Value Date   SARSCOV2NAA POSITIVE (A) 01/25/2019   SARSCOV2NAA NEGATIVE 10/12/2018   SARSCOV2NAA NEGATIVE 10/03/2018   [place] NEGATIVE 09/10/2018     SpO2: 94 % O2 Flow Rate (L/min): 3 L/min  Hepatic Function Latest Ref Rng & Units 01/29/2019 01/28/2019 01/25/2019  Total Protein 6.5 - 8.1 g/dL 6.1(L) 5.5(L) 6.6  Albumin 3.5 - 5.0 g/dL 2.6(L) 2.4(L) 3.0(L)  AST 15 - 41 U/L _0 ALT 0 - 44 U/L _1 Alk Phosphatase 38 - 126 U/L 55 52 63  Total Bilirubin 0.3 - 1.2 mg/dL 0.5 0.5 0.6        Component Value Date/Time   BNP 796.3 (H) 01/29/2019 0440      2. Acute on Chronic diastolic CHF.  EF 60% on echocardiogram in 2016.  IV Lasix 60 mg on 01/29/2019 due to shortness of breath and Rales, will also add Nitropaste, lower than dose beta-blocker due to resting bradycardia  3.  CAD.  No acute issues home dose beta-blocker, statin & Zetia will be continued for secondary prevention.  4.  Chronic atrial fibrillation.  Mali vas 2 score of at least 3.  Continue combination of oral beta-blocker and Eliquis.  Pharmacy to dose.  PRN IV Lopressor also ordered.  5.  Hypertension.  Stable on beta-blocker, dose cut due to bradyycardia.  6.  Dyslipidemia.  Stable on combination of statin and Zetia.  7.  Recent left BKA. Came with Left BKA and right heel ulcers which he came with at [facility].  Wound care will be  consulted.  No signs of active infection.  See pictures aboveSupportive care.  PT OT.  8.  COPD.  On as needed home oxygen.  No acute issues supportive care.  9.  Diabetic peripheral neuropathy.  Continue Celexa and Neurontin.    10.  AKI.  Baseline creatinine around 1.2.  Avoid nephrotoxins and monitor.  Renal function seems to be improving.  This happened at [facility].    11.  OSA.  Oxygen at night at [facility].    12. Hypothyroidism - stable TSH, on Synthroid.  DM type II.  On Lantus and sliding scale.  Pre-meal NovoLog added.  Recent A1c suggests stable outpatient control.  Monitor and adjust  Lab Results  Component Value Date   HGBA1C 6.7 (H) 01/26/2019   CBG (last 3)  Recent Labs    01/28/19 1644 01/28/19 2108 01/29/19 0833  GLUCAP 291* 224* 126*    Condition -  Guarded  Family Communication  : Son on 01/29/2019 understands poor prognosis.  Code Status :  Full  Diet :   Diet Order            Diet heart healthy/carb modified Room service appropriate? Yes; Fluid consistency: Thin  Diet effective now               Disposition Plan  :  SNF  Consults  : None  Procedures  :     PUD Prophylaxis :   DVT Prophylaxis  :  Eliquis  Lab Results  Component Value Date  PLT 216 01/29/2019    Inpatient Medications  Scheduled Meds:  apixaban  5 mg Oral BID   atorvastatin  40 mg Oral q1800   chlorhexidine  15 mL Mouth Rinse BID   citalopram  10 mg Oral Daily   collagenase   Topical Daily   docusate sodium  100 mg Oral BID   dorzolamide  1 drop Both Eyes TID   ezetimibe  10 mg Oral Daily   finasteride  5 mg Oral Daily   gabapentin  100 mg Oral TID   insulin aspart  0-15 Units Subcutaneous TID WC   insulin aspart  0-5 Units Subcutaneous QHS   insulin aspart  3 Units Subcutaneous TID WC   insulin glargine  30 Units Subcutaneous BID   latanoprost  1 drop Both Eyes QHS   levothyroxine  50 mcg Oral QAC breakfast   mouth rinse  15 mL  Mouth Rinse q12n4p   methylPREDNISolone (SOLU-MEDROL) injection  40 mg Intravenous Daily   metoprolol succinate  25 mg Oral BID   montelukast  10 mg Oral QHS   nitroGLYCERIN  0.5 inch Topical Q6H   sodium chloride flush  3 mL Intravenous Q12H   tamsulosin  0.4 mg Oral QPC supper   traZODone  100 mg Oral QHS   vitamin C  250 mg Oral BID   Continuous Infusions:  remdesivir 100 mg in NS 250 mL 100 mg (01/28/19 1119)   PRN Meds:.acetaminophen, albuterol, ALPRAZolam, magnesium citrate, metoprolol tartrate, nitroGLYCERIN, [DISCONTINUED] ondansetron **OR** ondansetron (ZOFRAN) IV  Antibiotics  :    Anti-infectives (From admission, onward)   Start     Dose/Rate Route Frequency Ordered Stop   01/28/19 1000  remdesivir 100 mg in sodium chloride 0.9 % 250 mL IVPB     100 mg 500 mL/hr over 30 Minutes Intravenous Every 24 hours 02/02/2019 1143 01/31/19 0959   01/18/2019 1300  remdesivir 100 mg in sodium chloride 0.9 % 250 mL IVPB     100 mg 500 mL/hr over 30 Minutes Intravenous Every 24 hours 02/10/2019 1143 01/30/2019 1400       Time Spent in minutes  [place] M.D on 01/29/2019 at 10:25 AM  To page go to [email or web address] - password [facility]  [facility] -  Office  [phone]     See all Orders from today for further details    Objective:   Vitals:   01/28/19 1900 01/29/19 0400 01/29/19 0801 01/29/19 0836  BP: (!) 142/81 (!) 145/97  (!) 142/80  Pulse:  77  70  Resp: _0 Temp: 97.7 F (36.5 C) (!) 97 F (36.1 C)  (!) 97.5 F (36.4 C)  TempSrc: Oral Oral  Oral  SpO2: 96% 99% 100% 94%  Weight:      Height:        Wt Readings from Last 3 Encounters:  01/24/2019 80 kg  01/26/19 80.9 kg  12/26/18 78 kg     Intake/Output Summary (Last 24 hours) at 01/29/2019 1025 Last data filed at 01/28/2019 2000 Gross per 24 hour  Intake 370 ml  Output 350 ml  Net 20 ml     Physical Exam  Awake Alert, Oriented X 3, No new F.N deficits,    [place].AT,PERRAL Supple Neck,No JVD, No cervical lymphadenopathy appriciated.  Symmetrical Chest wall movement, Good air movement bilaterally, +ve wheezing n rales RRR,No Gallops, Rubs or new Murmurs, No Parasternal Heave +ve B.Sounds, Abd Soft, No tenderness, No organomegaly appriciated, No  rebound - guarding or rigidity. No Cyanosis,  L. BKA   L. BKA Stump     R.Heel \   Data Review:    CBC Recent Labs  Lab 01/25/19 1408 01/26/19 0358 01/17/2019 0529 01/28/19 0440 01/29/19 0440  WBC 9.1 4.6 9.8 5.3 14.1*  HGB 13.6 13.1 13.2 12.7* 14.2  HCT 41.6 40.9 41.9 40.9 46.4  PLT 189 165 173 165 216  MCV 87.2 87.6 88.4 89.5 90.6  MCH 28.5 28.1 27.8 27.8 27.7  MCHC 32.7 32.0 31.5 31.1 30.6  RDW 16.1* 16.1* 15.9* 15.9* 15.9*  LYMPHSABS 2.1  --   --  0.7 1.6  MONOABS 0.8  --   --  0.5 1.6*  EOSABS 0.0  --   --  0.0 0.0  BASOSABS 0.0  --   --  0.0 0.0    Chemistries  Recent Labs  Lab 01/25/19 1408 01/26/19 0358 01/25/2019 0529 01/28/19 0440 01/29/19 0440  NA 139 141 144 140 143  K 3.4* 3.2* 3.7 3.4* 3.5  CL 94* 101 106 103 103  CO2 _0 GLUCOSE 166* 317* 90 244* 151*  BUN 44* 48* 54* 50* 48*  CREATININE 1.80* 1.80* 1.50* 1.40* 1.25*  CALCIUM 8.5* 8.4* 8.9 8.9 9.4  MG  --  2.1 2.2 2.2 2.4  AST 26  --   --  15 16  ALT 17  --   --  16 16  ALKPHOS 63  --   --  52 55  BILITOT 0.6  --   --  0.5 0.5   ------------------------------------------------------------------------------------------------------------------ No results for input(s): CHOL, HDL, LDLCALC, TRIG, CHOLHDL, LDLDIRECT in the last 72 hours.  Lab Results  Component Value Date   HGBA1C 6.7 (H) 01/26/2019   ------------------------------------------------------------------------------------------------------------------ Recent Labs    01/28/19 0440  TSH 0.709    Cardiac Enzymes No results for input(s): CKMB, TROPONINI, MYOGLOBIN in the last 168 hours.  Invalid input(s):  CK ------------------------------------------------------------------------------------------------------------------    Component Value Date/Time   BNP 796.3 (H) 01/29/2019 0440    Micro Results Recent Results (from the past 240 hour(s))  Urine Culture     Status: None   Collection Time: 01/22/19 11:59 AM   Specimen: Urine  Result Value Ref Range Status   MICRO NUMBER: [id number]  Final   SPECIMEN QUALITY: Adequate  Final   Sample Source URINE  Final   STATUS: FINAL  Final   Result: No Growth  Final  Blood Culture (routine x 2)     Status: None (Preliminary result)   Collection Time: 01/25/19  2:08 PM   Specimen: BLOOD  Result Value Ref Range Status   Specimen Description BLOOD BLOOD LEFT HAND  Final   Special Requests   Final    BOTTLES DRAWN AEROBIC AND ANAEROBIC Blood Culture adequate volume   Culture   Final    NO GROWTH 4 DAYS Performed at [facility], [address], [place], [place] [address]    Report Status PENDING  Incomplete  Blood Culture (routine x 2)     Status: None (Preliminary result)   Collection Time: 01/25/19  2:09 PM   Specimen: BLOOD  Result Value Ref Range Status   Specimen Description BLOOD BLOOD LEFT FOREARM  Final   Special Requests   Final    BOTTLES DRAWN AEROBIC AND ANAEROBIC Blood Culture adequate volume   Culture   Final    NO GROWTH 4 DAYS Performed at [facility], [place],  [place], [place] [address]    Report Status PENDING  Incomplete  SARS CORONAVIRUS 2 (TAT 6-24 HRS) Nasopharyngeal Nasopharyngeal Swab     Status: Abnormal   Collection Time: 01/25/19  2:09 PM   Specimen: Nasopharyngeal Swab  Result Value Ref Range Status   SARS Coronavirus 2 POSITIVE (A) NEGATIVE Final    Comment: RESULT CALLED TO, READ BACK BY AND VERIFIED WITH: RN B MILLER @ 0149 01/26/19 BY S GEZAHEGN (NOTE) SARS-CoV-2 target nucleic acids are DETECTED. The SARS-CoV-2 RNA is generally detectable in upper and lower respiratory  specimens during the acute phase of infection. Positive results are indicative of active infection with SARS-CoV-2. Clinical  correlation with patient history and other diagnostic information is necessary to determine patient infection status. Positive results do  not rule out bacterial infection or co-infection with other viruses. The expected result is Negative. Fact Sheet for Patients: SugarRoll.be Fact Sheet for Healthcare Providers: https://www.woods-mathews.com/ This test is not yet approved or cleared by the [place] FDA and  has been authorized for detection and/or diagnosis of SARS-CoV-2 by FDA under an Emergency Use Authorization (EUA). This EUA will remain  in effect (meaning this test can be used)  for the duration of the COVID-19 declaration under Section 564(b)(1) of the Act, 21 U.S.C. section 360bbb-3(b)(1), unless the authorization is terminated or revoked sooner. Performed at [facility], [place] [address] [place], [place] [address]   Urine culture     Status: None   Collection Time: 01/25/19  4:16 PM   Specimen: In/Out Cath Urine  Result Value Ref Range Status   Specimen Description   Final    IN/OUT CATH URINE Performed at [facility], [address], [place], [place] [address]    Special Requests   Final    NONE Performed at [facility], [address] [place], [place] [address]    Culture   Final    NO GROWTH Performed at [facility], [place] [address], [place], [place] [address]    Report Status 01/26/2019 FINAL  Final  MRSA PCR Screening     Status: None   Collection Time: 01/26/19  3:52 AM   Specimen: Nasopharyngeal  Result Value Ref Range Status   MRSA by PCR NEGATIVE NEGATIVE Final    Comment:        The GeneXpert MRSA Assay (FDA approved for NASAL specimens only), is one component of a comprehensive MRSA colonization surveillance program. It is not intended to  diagnose MRSA infection nor to guide or monitor treatment for MRSA infections. Performed at [facility], [address] [place], [place] [address]     Radiology Reports Dg Chest [place] II 1 View  Result Date: 01/29/2019 CLINICAL DATA:  Shortness of breath EXAM: PORTABLE CHEST 1 VIEW COMPARISON:  January 25, 2019 FINDINGS: Significant worsening of infiltrate in the right lung primarily centrally. Stable cardiomegaly. There is a background of pulmonary venous congestion suspected. No pneumothorax. IMPRESSION: Significant worsening of pulmonary infiltrates on the right. This is suspected to represent infection given the patient's positive COVID-19 status. There may be a background of mild pulmonary venous congestion as well. Electronically Signed   By: Dorise Bullion III M.D   On: 01/29/2019 08:25   Dg Chest Port 1 View  Result Date: 01/25/2019 CLINICAL DATA:  Fever. Shortness of breath. EXAM: PORTABLE CHEST 1 VIEW COMPARISON:  10/13/2018 FINDINGS: There is cardiomegaly with pulmonary vascular congestion. There is haziness in the left perihilar region and in  the right upper and lower lobes, probably representing mild pulmonary edema or possibly faint pneumonia. No discrete pleural effusions. No acute bone abnormality. Prominent degenerative changes of the right shoulder. IMPRESSION: 1. Hazy infiltrates in the left perihilar region and in the right upper and lower lobes. 2. Cardiomegaly with pulmonary vascular congestion. The hazy infiltrates could represent pulmonary edema or pneumonia. Electronically Signed   By: Lorriane Shire M.D.   On: 01/25/2019 14:46   Dg Knee Complete 4 Views Left  Result Date: 01/25/2019 CLINICAL DATA:  Fever. Soft tissue wounds on the left BKA stump. EXAM: LEFT KNEE - COMPLETE 4+ VIEW COMPARISON:  Radiographs dated 12/25/2017 FINDINGS: Surgical margins of the fibula and tibia are sharp with no radiographic evidence suggestive of osteomyelitis. No significant  arthritic changes of the knee. Vascular stent in the distal left thigh. IMPRESSION: No significant abnormality. Specifically, no evidence of osteomyelitis in the tibia or fibula. Electronically Signed   By: Lorriane Shire M.D.   On: 01/25/2019 14:48   Vas [place] Burnard Bunting With/wo Tbi  Result Date: 01/16/2019 LOWER EXTREMITY DOPPLER STUDY Indications: Peripheral artery disease, and Left leg venous stasis ulcers and              left BKA.  Comparison Study: 05/31/2018 Performing Technologist: Concha Norway RVT  Examination Guidelines: A complete evaluation includes at minimum, Doppler waveform signals and systolic blood pressure reading at the level of bilateral brachial, anterior tibial, and posterior tibial arteries, when vessel segments are accessible. Bilateral testing is considered an integral part of a complete examination. Photoelectric Plethysmograph (PPG) waveforms and toe systolic pressure readings are included as required and additional duplex testing as needed. Limited examinations for reoccurring indications may be performed as noted.  ABI Findings: +---------+------------------+-----+----------+--------+  Right     Rt Pressure (mmHg) Index Waveform   Comment   +---------+------------------+-----+----------+--------+  Brachial  157                                           +---------+------------------+-----+----------+--------+  ATA       140                0.89  monophasic           +---------+------------------+-----+----------+--------+  PTA       166                1.06  biphasic             +---------+------------------+-----+----------+--------+  Great Toe 128                0.82  Normal               +---------+------------------+-----+----------+--------+ +--------+------------------+-----+--------+-------+  Left     Lt Pressure (mmHg) Index Waveform Comment  +--------+------------------+-----+--------+-------+  Brachial 157                                         +--------+------------------+-----+--------+-------+  ATA                                        BKA      +--------+------------------+-----+--------+-------+  PTA  BKA      +--------+------------------+-----+--------+-------+ +-------+-----------+-----------+------------+------------+  ABI/TBI Today's ABI Today's TBI Previous ABI Previous TBI  +-------+-----------+-----------+------------+------------+  Right   1.06        .82                                    +-------+-----------+-----------+------------+------------+  Left    BKA                                                +-------+-----------+-----------+------------+------------+ Right ABIs appear essentially unchanged compared to prior study on 05/31/2018.  Summary: Right: Resting right ankle-brachial index is within normal range. No evidence of significant right lower extremity arterial disease. The right toe-brachial index is normal. Left: BKA.  *See table(s) above for measurements and observations.  Electronically signed by Leotis Pain MD on 01/16/2019 at 4:53:34 PM.   Final

## 2019-01-29 NOTE — Evaluation (Signed)
Physical Therapy Evaluation Patient Details Name: [name]. MRN: [id number] DOB: Jun 29, 1931 Today's Date: 01/29/2019   History of Present Illness  [name]  is a 85 y.o. male,  with a known history of CAD, Chronic atrial fibrillation on Eliquis, Chronic diastolic CHF EF 54% in 6270, COPD on PRN 02 at home, CVA x 2, DM 2 and PAD status post left BKA in June 2020 who presented to the emergency room with complaints of fevers and was admitted for CAP at [facility], later Covid 19 came back +ve and was sent to [place]  The patient was being assessed for need for ICU transition by RN who reported pt. Was recently obtunded. Patient now awake, on  3L [place], SPO2 99%. Patient remained alert and able to participate in chair level activities: sitting upright, exercises for RLE. Patient will require mechanical lift back to bed as recliThe pr does not have a drop arm. P.sign tient reports that he has caregivers come in day and night, besides he wife. Patient used a sliding board for transfers PTA.  will need info as to caregivers being available  With Covid dx. For DC home. Pt admitted with above diagnosis.  Pt currently with functional limitations due to the deficits listed below (see PT Problem List). Pt will benefit from skilled PT to increase their independence and safety with mobility to allow discharge to the venue listed below.       Follow Up Recommendations Home health PT;SNF(depends on pt. having  caregivers)    Equipment Recommendations  (mechanical lift if remains very weak)    Recommendations for Other Services       Precautions / Restrictions Precautions Precautions: Fall Precaution Comments: L BKA; watch sats Restrictions Weight Bearing Restrictions: No      Mobility  Bed Mobility               General bed mobility comments: up in chair  Transfers                 General transfer comment: NT  Ambulation/Gait                 Stairs            Wheelchair Mobility    Modified Rankin (Stroke Patients Only)       Balance Overall balance assessment: Needs assistance Sitting-balance support: Single extremity supported Sitting balance-Leahy Scale: Fair Sitting balance - Comments: sitting in recliner to perform BADL, worked on sitting forward, which  pt. performed, Shifted weight to L/R for lift pad to be placed.                                     Pertinent Vitals/Pain Pain Assessment: No/denies pain    Home Living Family/patient expects to be discharged to:: Private residence Living Arrangements: Spouse/significant other Available Help at Discharge: Family;Available 24 hours/day;Personal care attendant Type of Home: House Home Access: Stairs to enter Entrance Stairs-Rails: Right;Left;Can reach both Entrance Stairs-Number of Steps: 2 Home Layout: One level Home Equipment: Walker - 2 wheels;Walker - 4 wheels;Cane - single point;Bedside commode;Shower seat;Shower seat - built in;Grab bars - toilet;Grab bars - tub/shower;Wheelchair - manual;Hospital bed;Other (comment) Additional Comments: has 24/7 caregivers in the home    Prior Function Level of Independence: Needs assistance   Gait / Transfers Assistance Needed: uses wheelchair with slide board  ADL's / Homemaking Assistance Needed:  assist for all BADL/IADL from caregivers and family        Hand Dominance        Extremity/Trunk Assessment   Upper Extremity Assessment Upper Extremity Assessment: Defer to OT evaluation RUE Deficits / Details: cannot complete AROM past ~60 degrees RUE: Unable to fully assess due to pain LUE Deficits / Details: cannot complete AROM past ~60 degrees LUE: Unable to fully assess due to pain    Lower Extremity Assessment Lower Extremity Assessment: RLE deficits/detail;LLE deficits/detail RLE Deficits / Details: sore on heel, decreased ankle and foot ROM, grossly 3/5 knee ext. LLE  Deficits / Details: BKA with dressing       Communication      Cognition Arousal/Alertness: Awake/alert Behavior During Therapy: WFL for tasks assessed/performed Overall Cognitive Status: Within Functional Limits for tasks assessed                                 General Comments: pt appearing mildly delerious, saying "this evening" when it is morning- but is overall functional for tasks assessed. Will continue to assess      General Comments      Exercises General Exercises - Lower Extremity Long Arc Quad: AROM;Both;10 reps;Seated   Assessment/Plan    PT Assessment Patient needs continued PT services  PT Problem List Decreased strength;Decreased mobility;Decreased safety awareness;Decreased range of motion;Decreased knowledge of precautions;Decreased activity tolerance;Cardiopulmonary status limiting activity;Decreased balance;Decreased knowledge of use of DME       PT Treatment Interventions Functional mobility training;DME instruction;Therapeutic activities;Balance training;Patient/family education    PT Goals (Current goals can be found in the Care Plan section)  Acute Rehab PT Goals Patient Stated Goal: to get healthy PT Goal Formulation: With patient Time For Goal Achievement: 02/12/19    Frequency Min 2X/week   Barriers to discharge   pt states  that he has 24/7 caregivers prior to covid DX Will need to see if he will have coverage.    Co-evaluation PT/OT/SLP Co-Evaluation/Treatment: Yes Reason for Co-Treatment: For patient/therapist safety;Complexity of the patient's impairments (multi-system involvement) PT goals addressed during session: Mobility/safety with mobility OT goals addressed during session: ADL's and self-care       AM-PAC PT "6 Clicks" Mobility  Outcome Measure Help needed turning from your back to your side while in a flat bed without using bedrails?: Total Help needed moving from lying on your back to sitting on the side of a  flat bed without using bedrails?: Total Help needed moving to and from a bed to a chair (including a wheelchair)?: Total Help needed standing up from a chair using your arms (e.g., wheelchair or bedside chair)?: Total Help needed to walk in hospital room?: Total Help needed climbing 3-5 steps with a railing? : Total 6 Click Score: 6    End of Session Equipment Utilized During Treatment: Oxygen Activity Tolerance: Patient tolerated treatment well Patient left: in chair;with call bell/phone within reach Nurse Communication: Mobility status;Need for lift equipment PT Visit Diagnosis: Muscle weakness (generalized) (M62.81)    Time: 5361-4431 PT Time Calculation (min) (ACUTE ONLY): 40 min   Charges:   PT Evaluation $PT Eval Moderate Complexity: 1 Mod PT Treatments $Therapeutic Activity: 8-22 mins        Tresa Endo PT Acute Rehabilitation Services  Office [phone]   Claretha Cooper 01/29/2019, 3:31 PM

## 2019-01-29 NOTE — Progress Notes (Signed)
ICU CN/rapid response RN rounding note. Patient responsive and oriented, but lethargic with increased work of breathing. Significant clinical change per primary RN. Messaged Dr. Candiss Norse to come assess patient for possible escalation of care. Dr. Candiss Norse does not wish to transfer to ICU until he has personally assessed the patient. Patient currently stable. Rapid response team will continue to round on the patient throughout the day to monitor for signs of clinical decompensation.

## 2019-01-29 NOTE — Progress Notes (Signed)
Pharmacy Antibiotic Note  Jarry Manon. is a 85 y.o. male admitted on 01/11/2019 with COVID-19 pneumonia.  Pharmacy has been consulted for Unasyn dosing for aspiration pneumonia.  SCr decreased to 1.25, CrCl ~ 43 ml/min.  PCT < 0.1.  WBC inc to 14.1 (D3 solumedrol), Afebrile.  Worsening pulmonary infiltrates and clinical condition.  Plan: Unasyn 3g IV q6h Follow up renal function, culture results, and clinical course.   Height: 5\' 6"  (167.6 cm) Weight: 176 lb 5.9 oz (80 kg) IBW/kg (Calculated) : 63.8  Temp (24hrs), Avg:97.5 F (36.4 C), Min:97 F (36.1 C), Max:97.8 F (36.6 C)  Recent Labs  Lab 01/25/19 1408 01/25/19 1617 01/26/19 0358 01/30/2019 0529 01/28/19 0440 01/29/19 0440  WBC 9.1  --  4.6 9.8 5.3 14.1*  CREATININE 1.80*  --  1.80* 1.50* 1.40* 1.25*  LATICACIDVEN 3.1* 2.6*  --   --   --   --     Estimated Creatinine Clearance: 43 mL/min (A) (by C-G formula based on SCr of 1.25 mg/dL (H)).    Allergies  Allergen Reactions  . Contrast Media [Iodinated Diagnostic Agents] Shortness Of Breath  . Morphine And Related Other (See Comments)    Hallucinations   . Niacin And Related Dermatitis  . Other     Other reaction(s): SHORTNESS OF BREATH  . Amlodipine Rash    Antimicrobials this admission: 10/16 Remdesivir >> 10/20 10/19 Unasyn >>   Dose adjustments this admission: none  Microbiology results: 10/15 [facility]): ngtd 10/15 [facility]): NGF 10/15 MRSA PCR [facility]): negative  Thank you for allowing pharmacy to be a part of this patient's care.  Gretta Arab PharmD, BCPS Clinical pharmacist phone 7am- 5pm: [phone] 01/29/2019 11:02 AM

## 2019-01-29 NOTE — Progress Notes (Signed)
Upon awakening to use urinal, patient noted to be very short of breath, wheezing, and O2 sats in 70s. O2 at 2L. O2 increased to 6 L to obtain sat 100%. Dr. Shanon Brow notified and orders received for albuterol mdi and singulair. PEEP and Incentive spirometer used. RN and CN remained in attendance until patient stable. Use of accessory muscles noted to be used, Hob elevated to optimize breathing. Will continue to monitor.

## 2019-01-29 NOTE — Evaluation (Signed)
Occupational Therapy Evaluation Patient Details Name: [name]. MRN: [id number] DOB: 04-Jul-1931 Today's Date: 01/29/2019    History of Present Illness [name]  is a 85 y.o. male,  with a known history of CAD, Chronic atrial fibrillation on Eliquis, Chronic diastolic CHF EF 43% in 1540, COPD on PRN 02 at home, CVA x 2, DM 2 and PAD status post left BKA in June 2020 who presented to the emergency room with complaints of fevers and was admitted for CAP at [facility], later Covid 19 came back +ve and was sent to [place]   Pt admitted with above diagnoses, presenting with compromised cardiopulmonary status with generalized weakness limiting ability to engage in BADL at desired level of independence. PTA pt living at home with spouse and 24/7 caregivers. He is up in chair at time of eval, and per Dr. Candiss Norse in room to stay in chair for few hours. Lift pad placed under pt for RN staff to help back to bed. Pt completed grooming tasks with set up A for supplies. He is on 5L [place] throughout session with VSS. Probe changed to ear for more clear reading. Education with return demonstration given on IS and flutter. Recommend pt return home with 24/7 caregivers and [place]. If this is not available, pt will need SNF placement. Will continue to follow acutely per POC listed below.     Follow Up Recommendations  Home health OT;Supervision/Assistance - 24 hour;SNF;Other (comment)(vs pending status of 24/7 care with COVID status)    Equipment Recommendations  None recommended by OT    Recommendations for Other Services       Precautions / Restrictions Precautions Precautions: Fall Precaution Comments: L BKA; watch sats Restrictions Weight Bearing Restrictions: No      Mobility Bed Mobility               General bed mobility comments: up in chair  Transfers                 General transfer comment: NT    Balance Overall balance assessment: Needs  assistance Sitting-balance support: Single extremity supported Sitting balance-Leahy Scale: Fair Sitting balance - Comments: sitting in recliner to perform BADL                                   ADL either performed or assessed with clinical judgement   ADL Overall ADL's : Needs assistance/impaired Eating/Feeding: Set up;Sitting   Grooming: Set up;Sitting;Oral care Grooming Details (indicate cue type and reason): to complete oral care, decreased [place] strength noted to open packaging Upper Body Bathing: Total assistance;Sitting;Bed level Upper Body Bathing Details (indicate cue type and reason): total A sponge baths at baseline Lower Body Bathing: Total assistance;Sitting/lateral leans;Bed level Lower Body Bathing Details (indicate cue type and reason): total A sponge baths at baseline Upper Body Dressing : Minimal assistance;Sitting   Lower Body Dressing: Total assistance;Sit to/from stand;Sitting/lateral leans   Toilet Transfer: Maximal assistance;+2 for physical assistance;Requires drop arm;Transfer board   Toileting- Clothing Manipulation and Hygiene: Total assistance;Sitting/lateral lean;Sit to/from stand     Tub/Shower Transfer Details (indicate cue type and reason): does not complete at baseline Functional mobility during ADLs: (up in chair only per Dr. Candiss Norse wanting pt to stay in chair) General ADL Comments: ltd by baseline phys deficits, decreased activity tolerance, and compromised cardiopulmonary status     Vision Baseline Vision/History: Wears glasses Wears  Glasses: At all times Patient Visual Report: No change from baseline Vision Assessment?: No apparent visual deficits     Perception     Praxis      Pertinent Vitals/Pain Pain Assessment: No/denies pain     Hand Dominance     Extremity/Trunk Assessment Upper Extremity Assessment Upper Extremity Assessment: Generalized weakness;RUE deficits/detail;LUE deficits/detail RUE Deficits / Details:  cannot complete AROM past ~60 degrees RUE: Unable to fully assess due to pain LUE Deficits / Details: cannot complete AROM past ~60 degrees LUE: Unable to fully assess due to pain   Lower Extremity Assessment Lower Extremity Assessment: Defer to PT evaluation       Communication     Cognition Arousal/Alertness: Awake/alert Behavior During Therapy: WFL for tasks assessed/performed Overall Cognitive Status: Within Functional Limits for tasks assessed                                 General Comments: pt appearing mildly delerious, saying "this evening" when it is morning- but is overall functional for tasks assessed. Will continue to assess   General Comments       Exercises     Shoulder Instructions      Home Living Family/patient expects to be discharged to:: Private residence Living Arrangements: Spouse/significant other Available Help at Discharge: Family;Available 24 hours/day;Personal care attendant Type of Home: House Home Access: Stairs to enter [employer] of Steps: 2 Entrance Stairs-Rails: Right;Left;Can reach both Home Layout: One level     Bathroom Shower/Tub: Tub/shower unit;Walk-in shower   Bathroom Toilet: Handicapped height     Home Equipment: Environmental consultant - 2 wheels;Walker - 4 wheels;Cane - single point;Bedside commode;Shower seat;Shower seat - built in;Grab bars - toilet;Grab bars - tub/shower;Wheelchair - manual;Hospital bed;Other (comment)   Additional Comments: has 24/7 caregivers in the home      Prior Functioning/Environment Level of Independence: Needs assistance  Gait / Transfers Assistance Needed: uses wheelchair with slide board ADL's / Homemaking Assistance Needed: assist for all BADL/IADL from caregivers and family            OT Problem List: Decreased strength;Decreased knowledge of use of DME or AE;Decreased activity tolerance;Impaired UE functional use;Impaired balance (sitting and/or standing);Decreased range of  motion      OT Treatment/Interventions: Therapeutic exercise;Patient/family education;Self-care/ADL training;Balance training;Energy conservation;Therapeutic activities;DME and/or AE instruction    OT Goals(Current goals can be found in the care plan section) Acute Rehab OT Goals Patient Stated Goal: to get healthy OT Goal Formulation: With patient Time For Goal Achievement: 02/12/19 Potential to Achieve Goals: Good  OT Frequency: Min 2X/week   Barriers to D/C:            Co-evaluation              AM-PAC OT "6 Clicks" Daily Activity     Outcome Measure Help from another person eating meals?: A Little Help from another person taking care of personal grooming?: A Little Help from another person toileting, which includes using toliet, bedpan, or urinal?: A Lot Help from another person bathing (including washing, rinsing, drying)?: Total Help from another person to put on and taking off regular upper body clothing?: A Little Help from another person to put on and taking off regular lower body clothing?: Total 6 Click Score: 13   End of Session Equipment Utilized During Treatment: Oxygen Nurse Communication: Mobility status  Activity Tolerance: Patient tolerated treatment well Patient left: in chair;with call bell/phone  within reach;with chair alarm set  OT Visit Diagnosis: Other abnormalities of gait and mobility (R26.89);Muscle weakness (generalized) (M62.81)                Time: 1610-9604 OT Time Calculation (min): 40 min Charges:  OT General Charges $OT Visit: 1 Visit OT Evaluation $OT Eval Moderate Complexity: [place], MSOT, OTR/L [place] OT/ Acute Relief OT [place] Office: [phone] [place]: Jay 01/29/2019, 2:00 PM

## 2019-01-29 NOTE — Evaluation (Signed)
Clinical/Bedside Swallow Evaluation Patient Details  Name: [name]. MRN: [id number] Date of Birth: 1931/11/01  Today's Date: 01/29/2019 Time: SLP Start Time (ACUTE ONLY): 1512 SLP Stop Time (ACUTE ONLY): 1525 SLP Time Calculation (min) (ACUTE ONLY): 13 min  Past Medical History:  Past Medical History:  Diagnosis Date  . Arthritis   . CAD    a. MI 01/29/1996 tx'd w/ TPA @ [place]; b. Myoview 06/2005: EF 50%, scar @ apex, mild peri-infarct ischemia  . Cancer ([place])    skin  . Chronic atrial fibrillation ([place])    a. since 2006; b. on warfarin  . Chronic diastolic CHF (congestive heart failure) ([place])    a. echo 04/2006: EF lower limits of nl, mod LVH, mild aortic root dilatation, & mild MR, biatrial enlargement; b. echo 04/2013: EF 60%, mod dilated LA, mild MR & TR, mod pulm HTN w/ RV systolic pressure 53, c. echo 04/21/14: EF 55-60%, unable to exclude WMA, severely dilated LA 6.6 cm, nl RVSP, mildly dilated aortic root  . COPD (chronic obstructive pulmonary disease) ([facility])    oxygen prn at home  . CVA 6144,3154   x2  . DM   . Falls   . GERD (gastroesophageal reflux disease)   . History of kidney stones   . HYPERLIPIDEMIA   . HYPERTENSION   . Kidney stone    a. s/p left ureteral stenting 04/24/14  . Left arm weakness    limited movement. S/P fall injury  . Neuropathy of both feet   . Poor balance   . Wears dentures    full upper and lower   Past Surgical History:  Past Surgical History:  Procedure Laterality Date  . AMPUTATION Left 10/06/2018   Procedure: AMPUTATION BELOW KNEE;  Surgeon: Algernon Huxley, MD;  Location: [facility];  Service: General;  Laterality: Left;  . AMPUTATION TOE Left 06/13/2018   Procedure: 1st Ray Resection Left;  Surgeon: Sharlotte Alamo, DPM;  Location: [facility];  Service: Podiatry;  Laterality: Left;  . BLADDER SURGERY     stent placement   . CARDIAC CATHETERIZATION  1997   [facility]  . CAROTID STENT INSERTION  1997  . CATARACT EXTRACTION W/PHACO Left  10/11/2017   Procedure: CATARACT EXTRACTION PHACO AND INTRAOCULAR LENS PLACEMENT ([place]) COMPLICATED LEFT DIABETIC;  Surgeon: Leandrew Koyanagi, MD;  Location: [facility];  Service: Ophthalmology;  Laterality: Left;  MALYUGIN Diabetic - insulin  . CATARACT EXTRACTION W/PHACO Right 11/30/2017   Procedure: CATARACT EXTRACTION PHACO AND INTRAOCULAR LENS PLACEMENT ([place]) COMPLICATED  RIGHT DIABETIC;  Surgeon: Leandrew Koyanagi, MD;  Location: [facility];  Service: Ophthalmology;  Laterality: Right;  Diabetic - insulin  . CIRCUMCISION  2016  . CORONARY ANGIOPLASTY  1997   s/p stent placement x 2   . CYSTOSCOPY W/ URETERAL STENT REMOVAL Left 10/09/2014   Procedure: CYSTOSCOPY WITH STENT REMOVAL;  Surgeon: Hollice Espy, MD;  Location: [facility];  Service: Urology;  Laterality: Left;  . CYSTOSCOPY WITH STENT PLACEMENT Left 10/09/2014   Procedure: CYSTOSCOPY WITH STENT PLACEMENT;  Surgeon: Hollice Espy, MD;  Location: [facility];  Service: Urology;  Laterality: Left;  . IRRIGATION AND DEBRIDEMENT FOOT Left 09/15/2018   Procedure: IRRIGATION AND DEBRIDEMENT FOOT DELAY CLOSURE;  Surgeon: Samara Deist, DPM;  Location: [facility];  Service: Podiatry;  Laterality: Left;  . KIDNEY SURGERY  05/2013   s/p stent placement   . LOWER EXTREMITY ANGIOGRAPHY Left 06/12/2018   Procedure: Lower Extremity Angiography;  Surgeon: Algernon Huxley, MD;  Location: [place] [facility];  Service: Cardiovascular;  Laterality: Left;  . LOWER EXTREMITY ANGIOGRAPHY Left 09/13/2018   Procedure: Lower Extremity Angiography;  Surgeon: Algernon Huxley, MD;  Location: [place] [facility];  Service: Cardiovascular;  Laterality: Left;  . stents ureters Bilateral   . TONSILLECTOMY AND ADENOIDECTOMY  1959  . URETEROSCOPY WITH HOLMIUM LASER LITHOTRIPSY Left 10/09/2014   Procedure: URETEROSCOPY WITH HOLMIUM LASER LITHOTRIPSY;  Surgeon: Hollice Espy, MD;  Location: [facility];  Service: Urology;  Laterality: Left;   HPI:  85 y.o.  male with a known history of CAD, Chronic atrial fibrillation on Eliquis, Chronic diastolic CHF EF 09% in 3267, COPD on PRN 02 at home, CVA x 2, DM 2 and PAD status post left BKA in June 2020 who presented to the emergency room with complaints of fevers and was admitted for CAP at [facility], later Covid test came back + and was sent to [place] / Plan / Recommendation Clinical Impression  Pt participated in clinical swallow assessment.  Oral mechanism exam normal; wears upper/lower dentures.  Demonstrated adequate mastication of solids; brisk swallow response; no s/s of aspiration with thin liquids.  Passed three oz water screen without deficit.  Pt noted to be belching intermittently, and he reported chronic hx of solid foods (eg, hard breads) "getting stuck." He appears to be coordinating his respirations/swallowing and there were no s/s of aspiration.  Recommend continuing a dysphagia 3 diet with thin liquids; give meds whole in puree.  Pt may benefit from esophageal work-up as an outpatient.  He has no further acute care SLP needs - our service will sign off.  SLP Visit Diagnosis: Dysphagia, unspecified (R13.10)    Aspiration Risk  No limitations    Diet Recommendation   dysphagia 3/thin liquids  Medication Administration: Whole meds with puree    Other  Recommendations Oral Care Recommendations: Oral care BID   Follow up Recommendations None      Frequency and Duration            Prognosis        Swallow Study   General Date of Onset: 01/23/2019 HPI: 85 y.o. male with a known history of CAD, Chronic atrial fibrillation on Eliquis, Chronic diastolic CHF EF 12% in 4580, COPD on PRN 02 at home, CVA x 2, DM 2 and PAD status post left BKA in June 2020 who presented to the emergency room with complaints of fevers and was admitted for CAP at [facility], later Covid test came back + and was sent to [facility] Type of Study: Bedside Swallow Evaluation Previous Swallow Assessment: no Diet  Prior to this Study: Dysphagia 3 (soft);Thin liquids Temperature Spikes Noted: No Respiratory Status: Nasal cannula History of Recent Intubation: No Behavior/Cognition: Alert;Cooperative Oral Cavity Assessment: Within Functional Limits Oral Care Completed by SLP: No Oral Cavity - Dentition: Dentures, top;Dentures, bottom Vision: Functional for self-feeding Self-Feeding Abilities: Able to feed self Patient Positioning: Upright in chair Baseline Vocal Quality: Normal Volitional Cough: Strong Volitional Swallow: Able to elicit    Oral/Motor/Sensory Function Overall Oral Motor/Sensory Function: Within functional limits   Ice Chips Ice chips: Within functional limits   Thin Liquid Thin Liquid: Within functional limits    Nectar Thick Nectar Thick Liquid: Not tested   Honey Thick Honey Thick Liquid: Not tested   Puree Puree: Within functional limits   Solid    Khalib Fendley L. Tivis Ringer, MA CCC/SLP Acute Rehabilitation Services Office number [phone] Pager [phone]  Solid: Within functional limits  Juan Quam Laurice 01/29/2019,3:31 PM

## 2019-01-29 NOTE — Progress Notes (Signed)
RN reported drowsiness to attending MD. I was on floor so evaluated urgently.   On my evaluation he is sleeping in the bedside chair. He's drowsy but rousable and not requiring redirection or supplemental stimulation to remain awake/conversant. He's oriented to person, place, situation, including plan to give lasix today, give antibiotic, etc. Neuro exam is nonfocal. Monitor reading 97% breathing 15/min while sleeping, no wheezes.   Vance Gather, MD 01/29/2019 11:29 AM

## 2019-01-29 NOTE — Progress Notes (Signed)
[place] notified of patient's increased O2 requirements and work of breathing overnight.  Upon assessment, patient is dyspneic at rest.  Audible wheezing.  Inhalers utilized.  Orders received.

## 2019-01-29 NOTE — Progress Notes (Signed)
Spoke to pt daughter, Butch Penny (listed as main contact for pt.).  Updated her on her dad's status. Pt. Sitting comfortably in recliner on 2 LPM. VS stable.

## 2019-01-29 NOTE — Progress Notes (Signed)
Dr. Candiss Norse called to check on patient.  Reported to MD that patient had 1000 ml output to foley as of 1500.  MD asked to let night shift know to increase his oxygen level by 2 liters during HS. Oxygen saturation goal of 90% unless pt. Appears to have increased work of breathing.  Notified Agricultural consultant of this request and will pass to night shift.

## 2019-01-29 NOTE — Progress Notes (Signed)
Lt stump(BKA) dressing change. Unstagable (escar)

## 2019-01-29 NOTE — Progress Notes (Signed)
Patient's daughter, Butch Penny, updated on patient condition and plan of care.  All questions welcomed and answered.

## 2019-01-29 NOTE — Progress Notes (Signed)
Patent assisted to recliner chair x 3 assist at this time.

## 2019-01-30 LAB — COMPREHENSIVE METABOLIC PANEL
ALT: 16 U/L (ref 0–44)
AST: 13 U/L — ABNORMAL LOW (ref 15–41)
Albumin: 2.6 g/dL — ABNORMAL LOW (ref 3.5–5.0)
Alkaline Phosphatase: 54 U/L (ref 38–126)
Anion gap: 11 (ref 5–15)
BUN: 42 mg/dL — ABNORMAL HIGH (ref 8–23)
CO2: 30 mmol/L (ref 22–32)
Calcium: 8.9 mg/dL (ref 8.9–10.3)
Chloride: 103 mmol/L (ref 98–111)
Creatinine, Ser: 1.22 mg/dL (ref 0.61–1.24)
GFR calc Af Amer: >60 mL/min (ref >60)
GFR calc non Af Amer: 54 mL/min — ABNORMAL LOW (ref >60)
Glucose, Bld: 239 mg/dL — ABNORMAL HIGH (ref 70–99)
Potassium: 3.8 mmol/L (ref 3.5–5.1)
Sodium: 144 mmol/L (ref 135–145)
Total Bilirubin: 0.6 mg/dL (ref 0.3–1.2)
Total Protein: 5.9 g/dL — ABNORMAL LOW (ref 6.5–8.1)

## 2019-01-30 LAB — CBC WITH DIFFERENTIAL/PLATELET
Abs Immature Granulocytes: 0.05 10*3/uL (ref 0.00–0.07)
Basophils Absolute: 0 10*3/uL (ref 0.0–0.1)
Basophils Relative: 0 %
Eosinophils Absolute: 0 10*3/uL (ref 0.0–0.5)
Eosinophils Relative: 0 %
HCT: 44.6 % (ref 39.0–52.0)
Hemoglobin: 13.9 g/dL (ref 13.0–17.0)
Immature Granulocytes: 1 %
Lymphocytes Relative: 9 %
Lymphs Abs: 0.8 10*3/uL (ref 0.7–4.0)
MCH: 27.8 pg (ref 26.0–34.0)
MCHC: 31.2 g/dL (ref 30.0–36.0)
MCV: 89.2 fL (ref 80.0–100.0)
Monocytes Absolute: 0.7 10*3/uL (ref 0.1–1.0)
Monocytes Relative: 8 %
Neutro Abs: 7.3 10*3/uL (ref 1.7–7.7)
Neutrophils Relative %: 82 %
Platelets: 210 10*3/uL (ref 150–400)
RBC: 5 MIL/uL (ref 4.22–5.81)
RDW: 15.9 % — ABNORMAL HIGH (ref 11.5–15.5)
WBC: 8.8 10*3/uL (ref 4.0–10.5)
nRBC: 0 % (ref 0.0–0.2)

## 2019-01-30 LAB — GLUCOSE, CAPILLARY
Glucose-Capillary: 214 mg/dL — ABNORMAL HIGH (ref 70–99)
Glucose-Capillary: 327 mg/dL — ABNORMAL HIGH (ref 70–99)
Glucose-Capillary: 410 mg/dL — ABNORMAL HIGH (ref 70–99)
Glucose-Capillary: 253 mg/dL — ABNORMAL HIGH (ref 70–99)

## 2019-01-30 LAB — CULTURE, BLOOD (ROUTINE X 2)
Culture: NO GROWTH
Culture: NO GROWTH
Special Requests: ADEQUATE
Special Requests: ADEQUATE

## 2019-01-30 LAB — PROCALCITONIN: Procalcitonin: <0.1 ng/mL

## 2019-01-30 LAB — MAGNESIUM: Magnesium: 2.2 mg/dL (ref 1.7–2.4)

## 2019-01-30 LAB — BRAIN NATRIURETIC PEPTIDE: B Natriuretic Peptide: 606 pg/mL — ABNORMAL HIGH (ref 0.0–100.0)

## 2019-01-30 LAB — C-REACTIVE PROTEIN: CRP: 2.5 mg/dL — ABNORMAL HIGH (ref <1.0)

## 2019-01-30 LAB — D-DIMER, QUANTITATIVE: D-Dimer, Quant: 0.5 ug/mL-FEU (ref 0.00–0.50)

## 2019-01-30 MED ORDER — FUROSEMIDE 10 MG/ML IJ SOLN
40.0000 mg | Freq: Every day | INTRAMUSCULAR | Status: DC
  Administered 2019-01-30 – 2019-02-01 (×3): 40 mg via INTRAVENOUS
  Filled 2019-01-30 (×3): qty 4

## 2019-01-30 MED ORDER — CHLORHEXIDINE GLUCONATE CLOTH 2 % EX PADS
6.0000 | MEDICATED_PAD | Freq: Every day | CUTANEOUS | Status: DC
  Administered 2019-01-30 – 2019-02-07 (×8): 6 via TOPICAL

## 2019-01-30 MED ORDER — POTASSIUM CHLORIDE CRYS ER 20 MEQ PO TBCR
40.0000 meq | EXTENDED_RELEASE_TABLET | Freq: Every day | ORAL | Status: DC
  Administered 2019-01-30 – 2019-01-31 (×2): 40 meq via ORAL
  Filled 2019-01-30 (×2): qty 2

## 2019-01-30 MED ORDER — HYDRALAZINE HCL 25 MG PO TABS
50.0000 mg | ORAL_TABLET | Freq: Three times a day (TID) | ORAL | Status: DC
  Administered 2019-01-30 – 2019-01-31 (×3): 50 mg via ORAL
  Filled 2019-01-30 (×3): qty 1

## 2019-01-30 NOTE — Progress Notes (Signed)
PROGRESS NOTE                                                                                                                                                                                                             Patient Demographics:    [name], is a 85 y.o. male, DOB - [birth year], UUV:[id number]  Outpatient Primary MD for the patient is Jearld Fenton, NP    LOS - 3  Admit date - 01/22/2019    CC - Cough     Brief Narrative   [name]  is a 85 y.o. male,  with a known history of CAD,Chronic atrial fibrillation on Eliquis,Chronic diastolic CHF EF 47% in 4259,DGLO on PRN 02 at home,CVA x 2,DM 2 and PAD status post left BKA in June 7564 complicated by a stump Ulcer at [facility] who presented to the emergency room with complaints of fevers and was admitted for CAP at [facility], later Covid 19 came back +ve and was sent to [facility]. Currently mild SOB on 2 lits o2, no other complaints.   Subjective:   Patient in chair, appears comfortable, denies any headache, no fever, no chest pain or pressure, no shortness of breath , no abdominal pain. No focal weakness.   Assessment  & Plan :     1.  Acute on chronic hypoxic respiratory failure due to acute COVID-19 pneumonitis + CHF.  Patient is on as needed 2 L nasal cannula oxygen at home due to chronic diastolic CHF and COPD.  His COVID-19 infection is moderate in intensity, he has been placed on IV steroids along with IV Remdisvir.   Continue full supportive care.  Extremely poor baseline with very fragile status and multiple comorbidities.  Prognosis is poor.   COVID-19 Labs  Recent Labs    01/28/19 0440 01/29/19 0440 01/30/19 0420  DDIMER 0.42 0.62* 0.50  CRP 1.6* 0.9 2.5*    Lab Results  Component Value Date   SARSCOV2NAA POSITIVE (A) 01/25/2019   SARSCOV2NAA NEGATIVE 10/12/2018   SARSCOV2NAA NEGATIVE 10/03/2018   [facility] NEGATIVE 09/10/2018      SpO2: 100 % O2 Flow Rate (L/min): 2 L/min  Hepatic Function Latest Ref Rng & Units 01/30/2019 01/29/2019 01/28/2019  Total Protein 6.5 - 8.1 g/dL 5.9(L) 6.1(L) 5.5(L)  Albumin 3.5 - 5.0 g/dL 2.6(L) 2.6(L) 2.4(L)  AST 15 -  41 U/L 13(L) 16 15  ALT 0 - 44 U/L 16 16 16   Alk Phosphatase 38 - 126 U/L 54 55 52  Total Bilirubin 0.3 - 1.2 mg/dL 0.6 0.5 0.5        Component Value Date/Time   BNP 606.0 (H) 01/30/2019 0420      2. Acute on Chronic diastolic CHF.  EF 60% on echocardiogram in 2016. Given IV lasix and NTG paste, excellent Ur output with resolution of symptoms, lower than dose beta-blocker due to resting bradycardia, IV Lasix and K Dur. DOSE daily.  3.  CAD.  No acute issues home dose beta-blocker, statin & Zetia will be continued for secondary prevention.  4.  Chronic atrial fibrillation.  Mali vas 2 score of at least 3.  Continue combination of oral beta-blocker and Eliquis.  Pharmacy to dose.  PRN IV Lopressor also ordered.  5.  Hypertension.  Stable on beta-blocker, dose cut due to bradycardia. Add Hydralazine for better BP control.  6.  Dyslipidemia.  Stable on combination of statin and Zetia.  7.  Recent left BKA. Came with Left BKA and right heel ulcers which he came with at [facility].  Wound care will be consulted.  No signs of active infection.  See pictures aboveSupportive care.  PT OT.  8.  COPD.  On as needed home oxygen.  No acute issues supportive care.  9.  Diabetic peripheral neuropathy.  Continue Celexa and Neurontin.    10.  AKI.  Baseline creatinine around 1.2.  Avoid nephrotoxins and monitor.  Renal function seems to be improving.  This happened at [facility].    11.  OSA.  Oxygen at night at [facility].    12. Hypothyroidism - stable TSH, on Synthroid.  13. ? Aspiration - clinically ruled out, speech following, Unasyn stop x 3 days only.  14. DM type II.  On Lantus and sliding scale.  Pre-meal NovoLog added.  Recent A1c suggests stable  outpatient control.  Monitor and adjust  Lab Results  Component Value Date   HGBA1C 6.7 (H) 01/26/2019   CBG (last 3)  Recent Labs    01/29/19 2100 01/29/19 2152 01/30/19 0802  GLUCAP 243* 248* 214*    Condition -  Guarded  Family Communication  : Son on 01/29/2019 understands poor prognosis.  Code Status :  Full  Diet :   Diet Order            DIET DYS 3 Room service appropriate? Yes; Fluid consistency: Thin  Diet effective now               Disposition Plan  :  SNF  Consults  : None  Procedures  :     PUD Prophylaxis :   DVT Prophylaxis  :  Eliquis  Lab Results  Component Value Date   PLT 210 01/30/2019    Inpatient Medications  Scheduled Meds:  apixaban  5 mg Oral BID   atorvastatin  40 mg Oral q1800   chlorhexidine  15 mL Mouth Rinse BID   Chlorhexidine Gluconate Cloth  6 each Topical Daily   citalopram  10 mg Oral Daily   collagenase   Topical Daily   docusate sodium  100 mg Oral BID   dorzolamide  1 drop Both Eyes TID   ezetimibe  10 mg Oral Daily   finasteride  5 mg Oral Daily   furosemide  40 mg Intravenous Daily   gabapentin  100 mg Oral TID   insulin aspart  0-15 Units Subcutaneous TID WC   insulin aspart  0-5 Units Subcutaneous QHS   insulin aspart  7 Units Subcutaneous TID WC   insulin glargine  35 Units Subcutaneous BID   latanoprost  1 drop Both Eyes QHS   levothyroxine  50 mcg Oral QAC breakfast   mouth rinse  15 mL Mouth Rinse q12n4p   methylPREDNISolone (SOLU-MEDROL) injection  40 mg Intravenous Q12H   metoprolol succinate  25 mg Oral BID   montelukast  10 mg Oral QHS   nitroGLYCERIN  0.5 inch Topical Q6H   potassium chloride  40 mEq Oral Daily   sodium chloride flush  3 mL Intravenous Q12H   tamsulosin  0.4 mg Oral QPC supper   traZODone  100 mg Oral QHS   vitamin C  250 mg Oral BID   Continuous Infusions:  ampicillin-sulbactam (UNASYN) IV 3 g (01/30/19 0509)   PRN Meds:.acetaminophen,  albuterol, ALPRAZolam, magnesium citrate, metoprolol tartrate, nitroGLYCERIN, [DISCONTINUED] ondansetron **OR** ondansetron (ZOFRAN) IV  Antibiotics  :    Anti-infectives (From admission, onward)   Start     Dose/Rate Route Frequency Ordered Stop   01/29/19 1115  Ampicillin-Sulbactam (UNASYN) 3 g in sodium chloride 0.9 % 100 mL IVPB     3 g 200 mL/hr over 30 Minutes Intravenous Every 6 hours 01/29/19 1100 02/01/19 1159   01/28/19 1000  remdesivir 100 mg in sodium chloride 0.9 % 250 mL IVPB     100 mg 500 mL/hr over 30 Minutes Intravenous Every 24 hours 01/19/2019 1143 01/30/19 0940   01/24/2019 1300  remdesivir 100 mg in sodium chloride 0.9 % 250 mL IVPB     100 mg 500 mL/hr over 30 Minutes Intravenous Every 24 hours 01/24/2019 1143 01/17/2019 1400       Time Spent in minutes  [place] M.D on 01/30/2019 at 9:54 AM  To page go to [email or web address] - password [facility]  [facility] -  Office  [phone]     See all Orders from today for further details    Objective:   Vitals:   01/30/19 0300 01/30/19 0729 01/30/19 0900 01/30/19 0901  BP: 129/71 (!) 132/100  (!) 151/78  Pulse: 75 80    Resp: (!) 23 (!) 21 (!) 25   Temp: 97.6 F (36.4 C) 98 F (36.7 C)    TempSrc: Oral Oral    SpO2: 97% 99% 100%   Weight:      Height:        Wt Readings from Last 3 Encounters:  01/25/2019 80 kg  01/26/19 80.9 kg  12/26/18 78 kg     Intake/Output Summary (Last 24 hours) at 01/30/2019 0954 Last data filed at 01/30/2019 0900 Gross per 24 hour  Intake 550 ml  Output 2325 ml  Net -1775 ml     Physical Exam  Awake Alert,   No new F.N deficits, Normal affect [place].AT,PERRAL Supple Neck,No JVD, No cervical lymphadenopathy appriciated.  Symmetrical Chest wall movement, Good air movement bilaterally, few rales RRR,No Gallops, Rubs or new Murmurs, No Parasternal Heave +ve B.Sounds, Abd Soft, No tenderness, No organomegaly appriciated, No rebound - guarding or rigidity. No  Cyanosis, L BKA   L. BKA Stump     R.Heel \   Data Review:    CBC Recent Labs  Lab 01/25/19 1408 01/26/19 0358 02/03/2019 0529 01/28/19 0440 01/29/19 0440 01/30/19 0420  WBC 9.1 4.6 9.8 5.3 14.1* 8.8  HGB 13.6 13.1 13.2 12.7* 14.2 13.9  HCT  41.6 40.9 41.9 40.9 46.4 44.6  PLT 189 165 173 165 216 210  MCV 87.2 87.6 88.4 89.5 90.6 89.2  MCH 28.5 28.1 27.8 27.8 27.7 27.8  MCHC 32.7 32.0 31.5 31.1 30.6 31.2  RDW 16.1* 16.1* 15.9* 15.9* 15.9* 15.9*  LYMPHSABS 2.1  --   --  0.7 1.6 0.8  MONOABS 0.8  --   --  0.5 1.6* 0.7  EOSABS 0.0  --   --  0.0 0.0 0.0  BASOSABS 0.0  --   --  0.0 0.0 0.0    Chemistries  Recent Labs  Lab 01/25/19 1408 01/26/19 0358 01/16/2019 0529 01/28/19 0440 01/29/19 0440 01/30/19 0420  NA 139 141 144 140 143 144  K 3.4* 3.2* 3.7 3.4* 3.5 3.8  CL 94* 101 106 103 103 103  CO2 31 27 28 28 31 30   GLUCOSE 166* 317* 90 244* 151* 239*  BUN 44* 48* 54* 50* 48* 42*  CREATININE 1.80* 1.80* 1.50* 1.40* 1.25* 1.22  CALCIUM 8.5* 8.4* 8.9 8.9 9.4 8.9  MG  --  2.1 2.2 2.2 2.4 2.2  AST 26  --   --  15 16 13*  ALT 17  --   --  16 16 16   ALKPHOS 63  --   --  52 55 54  BILITOT 0.6  --   --  0.5 0.5 0.6   ------------------------------------------------------------------------------------------------------------------ No results for input(s): CHOL, HDL, LDLCALC, TRIG, CHOLHDL, LDLDIRECT in the last 72 hours.  Lab Results  Component Value Date   HGBA1C 6.7 (H) 01/26/2019   ------------------------------------------------------------------------------------------------------------------ Recent Labs    01/28/19 0440  TSH 0.709    Cardiac Enzymes No results for input(s): CKMB, TROPONINI, MYOGLOBIN in the last 168 hours.  Invalid input(s): CK ------------------------------------------------------------------------------------------------------------------    Component Value Date/Time   BNP 606.0 (H) 01/30/2019 0420    Micro Results Recent Results  (from the past 240 hour(s))  Urine Culture     Status: None   Collection Time: 01/22/19 11:59 AM   Specimen: Urine  Result Value Ref Range Status   MICRO NUMBER: [id number]  Final   SPECIMEN QUALITY: Adequate  Final   Sample Source URINE  Final   STATUS: FINAL  Final   Result: No Growth  Final  Blood Culture (routine x 2)     Status: None   Collection Time: 01/25/19  2:08 PM   Specimen: BLOOD  Result Value Ref Range Status   Specimen Description BLOOD BLOOD LEFT HAND  Final   Special Requests   Final    BOTTLES DRAWN AEROBIC AND ANAEROBIC Blood Culture adequate volume   Culture   Final    NO GROWTH 5 DAYS Performed at [facility], [address] [place], [place] [address]    Report Status 01/30/2019 FINAL  Final  Blood Culture (routine x 2)     Status: None   Collection Time: 01/25/19  2:09 PM   Specimen: BLOOD  Result Value Ref Range Status   Specimen Description BLOOD BLOOD LEFT FOREARM  Final   Special Requests   Final    BOTTLES DRAWN AEROBIC AND ANAEROBIC Blood Culture adequate volume   Culture   Final    NO GROWTH 5 DAYS Performed at [facility], [address], [place], [place] [address]    Report Status 01/30/2019 FINAL  Final  SARS CORONAVIRUS 2 (TAT 6-24 HRS) Nasopharyngeal Nasopharyngeal Swab     Status: Abnormal   Collection Time: 01/25/19  2:09 PM  Specimen: Nasopharyngeal Swab  Result Value Ref Range Status   SARS Coronavirus 2 POSITIVE (A) NEGATIVE Final    Comment: RESULT CALLED TO, READ BACK BY AND VERIFIED WITH: RN B MILLER @ 0149 01/26/19 BY S GEZAHEGN (NOTE) SARS-CoV-2 target nucleic acids are DETECTED. The SARS-CoV-2 RNA is generally detectable in upper and lower respiratory specimens during the acute phase of infection. Positive results are indicative of active infection with SARS-CoV-2. Clinical  correlation with patient history and other diagnostic information is necessary to determine patient infection status. Positive  results do  not rule out bacterial infection or co-infection with other viruses. The expected result is Negative. Fact Sheet for Patients: SugarRoll.be Fact Sheet for Healthcare Providers: https://www.woods-mathews.com/ This test is not yet approved or cleared by the [place] FDA and  has been authorized for detection and/or diagnosis of SARS-CoV-2 by FDA under an Emergency Use Authorization (EUA). This EUA will remain  in effect (meaning this test can be used)  for the duration of the COVID-19 declaration under Section 564(b)(1) of the Act, 21 U.S.C. section 360bbb-3(b)(1), unless the authorization is terminated or revoked sooner. Performed at [facility], [place] [address] [place], [place] [address]   Urine culture     Status: None   Collection Time: 01/25/19  4:16 PM   Specimen: In/Out Cath Urine  Result Value Ref Range Status   Specimen Description   Final    IN/OUT CATH URINE Performed at [facility], [address], [place], [place] [address]    Special Requests   Final    NONE Performed at [facility], [address], [place], [place] [address]    Culture   Final    NO GROWTH Performed at [facility], [place] [address] [place], [place] [address]    Report Status 01/26/2019 FINAL  Final  MRSA PCR Screening     Status: None   Collection Time: 01/26/19  3:52 AM   Specimen: Nasopharyngeal  Result Value Ref Range Status   MRSA by PCR NEGATIVE NEGATIVE Final    Comment:        The GeneXpert MRSA Assay (FDA approved for NASAL specimens only), is one component of a comprehensive MRSA colonization surveillance program. It is not intended to diagnose MRSA infection nor to guide or monitor treatment for MRSA infections. Performed at [facility], [address], [place], [place] [address]     Radiology Reports Dg Chest [place] 1 View  Result Date: 01/29/2019 CLINICAL DATA:   Shortness of breath EXAM: PORTABLE CHEST 1 VIEW COMPARISON:  January 25, 2019 FINDINGS: Significant worsening of infiltrate in the right lung primarily centrally. Stable cardiomegaly. There is a background of pulmonary venous congestion suspected. No pneumothorax. IMPRESSION: Significant worsening of pulmonary infiltrates on the right. This is suspected to represent infection given the patient's positive COVID-19 status. There may be a background of mild pulmonary venous congestion as well. Electronically Signed   By: Dorise Bullion III M.D   On: 01/29/2019 08:25   Dg Chest Port 1 View  Result Date: 01/25/2019 CLINICAL DATA:  Fever. Shortness of breath. EXAM: PORTABLE CHEST 1 VIEW COMPARISON:  10/13/2018 FINDINGS: There is cardiomegaly with pulmonary vascular congestion. There is haziness in the left perihilar region and in the right upper and lower lobes, probably representing mild pulmonary edema or possibly faint pneumonia. No discrete pleural effusions. No acute bone abnormality. Prominent degenerative changes of the right shoulder. IMPRESSION: 1. Hazy infiltrates in the left  perihilar region and in the right upper and lower lobes. 2. Cardiomegaly with pulmonary vascular congestion. The hazy infiltrates could represent pulmonary edema or pneumonia. Electronically Signed   By: Lorriane Shire M.D.   On: 01/25/2019 14:46   Dg Knee Complete 4 Views Left  Result Date: 01/25/2019 CLINICAL DATA:  Fever. Soft tissue wounds on the left BKA stump. EXAM: LEFT KNEE - COMPLETE 4+ VIEW COMPARISON:  Radiographs dated 12/25/2017 FINDINGS: Surgical margins of the fibula and tibia are sharp with no radiographic evidence suggestive of osteomyelitis. No significant arthritic changes of the knee. Vascular stent in the distal left thigh. IMPRESSION: No significant abnormality. Specifically, no evidence of osteomyelitis in the tibia or fibula. Electronically Signed   By: Lorriane Shire M.D.   On: 01/25/2019 14:48   Vas [place]  Burnard Bunting With/wo Tbi  Result Date: 01/16/2019 LOWER EXTREMITY DOPPLER STUDY Indications: Peripheral artery disease, and Left leg venous stasis ulcers and              left BKA.  Comparison Study: 05/31/2018 Performing Technologist: Concha Norway RVT  Examination Guidelines: A complete evaluation includes at minimum, Doppler waveform signals and systolic blood pressure reading at the level of bilateral brachial, anterior tibial, and posterior tibial arteries, when vessel segments are accessible. Bilateral testing is considered an integral part of a complete examination. Photoelectric Plethysmograph (PPG) waveforms and toe systolic pressure readings are included as required and additional duplex testing as needed. Limited examinations for reoccurring indications may be performed as noted.  ABI Findings: +---------+------------------+-----+----------+--------+  Right     Rt Pressure (mmHg) Index Waveform   Comment   +---------+------------------+-----+----------+--------+  Brachial  157                                           +---------+------------------+-----+----------+--------+  ATA       140                0.89  monophasic           +---------+------------------+-----+----------+--------+  PTA       166                1.06  biphasic             +---------+------------------+-----+----------+--------+  Great Toe 128                0.82  Normal               +---------+------------------+-----+----------+--------+ +--------+------------------+-----+--------+-------+  Left     Lt Pressure (mmHg) Index Waveform Comment  +--------+------------------+-----+--------+-------+  Brachial 157                                        +--------+------------------+-----+--------+-------+  ATA                                        BKA      +--------+------------------+-----+--------+-------+  PTA                                        BKA      +--------+------------------+-----+--------+-------+  +-------+-----------+-----------+------------+------------+  ABI/TBI  Today's ABI Today's TBI Previous ABI Previous TBI  +-------+-----------+-----------+------------+------------+  Right   1.06        .82                                    +-------+-----------+-----------+------------+------------+  Left    BKA                                                +-------+-----------+-----------+------------+------------+ Right ABIs appear essentially unchanged compared to prior study on 05/31/2018.  Summary: Right: Resting right ankle-brachial index is within normal range. No evidence of significant right lower extremity arterial disease. The right toe-brachial index is normal. Left: BKA.  *See table(s) above for measurements and observations.  Electronically signed by Leotis Pain MD on 01/16/2019 at 4:53:34 PM.   Final

## 2019-01-30 NOTE — Telephone Encounter (Signed)
Error

## 2019-01-31 ENCOUNTER — Inpatient Hospital Stay (HOSPITAL_COMMUNITY): Payer: Self-pay

## 2019-01-31 ENCOUNTER — Inpatient Hospital Stay (HOSPITAL_COMMUNITY): Payer: Medicare Other

## 2019-01-31 DIAGNOSIS — U071 COVID-19: Secondary | ICD-10-CM | POA: Diagnosis not present

## 2019-01-31 DIAGNOSIS — R0602 Shortness of breath: Secondary | ICD-10-CM | POA: Diagnosis not present

## 2019-01-31 DIAGNOSIS — J9601 Acute respiratory failure with hypoxia: Secondary | ICD-10-CM

## 2019-01-31 DIAGNOSIS — L8990 Pressure ulcer of unspecified site, unspecified stage: Secondary | ICD-10-CM | POA: Insufficient documentation

## 2019-01-31 LAB — MAGNESIUM: Magnesium: 2.3 mg/dL (ref 1.7–2.4)

## 2019-01-31 LAB — CBC WITH DIFFERENTIAL/PLATELET
Abs Immature Granulocytes: 0.13 10*3/uL — ABNORMAL HIGH (ref 0.00–0.07)
Basophils Absolute: 0 10*3/uL (ref 0.0–0.1)
Basophils Relative: 0 %
Eosinophils Absolute: 0 10*3/uL (ref 0.0–0.5)
Eosinophils Relative: 0 %
HCT: 45.5 % (ref 39.0–52.0)
Hemoglobin: 14.5 g/dL (ref 13.0–17.0)
Immature Granulocytes: 1 %
Lymphocytes Relative: 8 %
Lymphs Abs: 1 10*3/uL (ref 0.7–4.0)
MCH: 28 pg (ref 26.0–34.0)
MCHC: 31.9 g/dL (ref 30.0–36.0)
MCV: 87.8 fL (ref 80.0–100.0)
Monocytes Absolute: 1.2 10*3/uL — ABNORMAL HIGH (ref 0.1–1.0)
Monocytes Relative: 9 %
Neutro Abs: 10.3 10*3/uL — ABNORMAL HIGH (ref 1.7–7.7)
Neutrophils Relative %: 82 %
Platelets: 228 10*3/uL (ref 150–400)
RBC: 5.18 MIL/uL (ref 4.22–5.81)
RDW: 15.9 % — ABNORMAL HIGH (ref 11.5–15.5)
WBC: 12.6 10*3/uL — ABNORMAL HIGH (ref 4.0–10.5)
nRBC: 0 % (ref 0.0–0.2)

## 2019-01-31 LAB — POCT I-STAT 7, (LYTES, BLD GAS, ICA,H+H)
Acid-Base Excess: 5 mmol/L — ABNORMAL HIGH (ref 0.0–2.0)
Acid-Base Excess: 8 mmol/L — ABNORMAL HIGH (ref 0.0–2.0)
Bicarbonate: 30 mmol/L — ABNORMAL HIGH (ref 20.0–28.0)
Bicarbonate: 32.4 mmol/L — ABNORMAL HIGH (ref 20.0–28.0)
Calcium, Ion: 1.27 mmol/L (ref 1.15–1.40)
Calcium, Ion: 1.19 mmol/L (ref 1.15–1.40)
HCT: 44 % (ref 39.0–52.0)
HCT: 36 % — ABNORMAL LOW (ref 39.0–52.0)
Hemoglobin: 15 g/dL (ref 13.0–17.0)
Hemoglobin: 12.2 g/dL — ABNORMAL LOW (ref 13.0–17.0)
O2 Saturation: 86 %
O2 Saturation: 100 %
Patient temperature: 98.2
Patient temperature: 36.9
Potassium: 3.4 mmol/L — ABNORMAL LOW (ref 3.5–5.1)
Potassium: 3.3 mmol/L — ABNORMAL LOW (ref 3.5–5.1)
Sodium: 141 mmol/L (ref 135–145)
Sodium: 142 mmol/L (ref 135–145)
TCO2: 31 mmol/L (ref 22–32)
TCO2: 34 mmol/L — ABNORMAL HIGH (ref 22–32)
pCO2 arterial: 45.8 mmHg (ref 32.0–48.0)
pCO2 arterial: 44.3 mmHg (ref 32.0–48.0)
pH, Arterial: 7.423 (ref 7.350–7.450)
pH, Arterial: 7.471 — ABNORMAL HIGH (ref 7.350–7.450)
pO2, Arterial: 50 mmHg — ABNORMAL LOW (ref 83.0–108.0)
pO2, Arterial: 526 mmHg — ABNORMAL HIGH (ref 83.0–108.0)

## 2019-01-31 LAB — COMPREHENSIVE METABOLIC PANEL
ALT: 18 U/L (ref 0–44)
AST: 17 U/L (ref 15–41)
Albumin: 2.9 g/dL — ABNORMAL LOW (ref 3.5–5.0)
Alkaline Phosphatase: 55 U/L (ref 38–126)
Anion gap: 11 (ref 5–15)
BUN: 40 mg/dL — ABNORMAL HIGH (ref 8–23)
CO2: 29 mmol/L (ref 22–32)
Calcium: 9.1 mg/dL (ref 8.9–10.3)
Chloride: 105 mmol/L (ref 98–111)
Creatinine, Ser: 0.95 mg/dL (ref 0.61–1.24)
GFR calc Af Amer: >60 mL/min (ref >60)
GFR calc non Af Amer: >60 mL/min (ref >60)
Glucose, Bld: 113 mg/dL — ABNORMAL HIGH (ref 70–99)
Potassium: 3.7 mmol/L (ref 3.5–5.1)
Sodium: 145 mmol/L (ref 135–145)
Total Bilirubin: 0.9 mg/dL (ref 0.3–1.2)
Total Protein: 6.3 g/dL — ABNORMAL LOW (ref 6.5–8.1)

## 2019-01-31 LAB — PROCALCITONIN: Procalcitonin: <0.1 ng/mL

## 2019-01-31 LAB — BRAIN NATRIURETIC PEPTIDE: B Natriuretic Peptide: 779.9 pg/mL — ABNORMAL HIGH (ref 0.0–100.0)

## 2019-01-31 LAB — GLUCOSE, CAPILLARY
Glucose-Capillary: 102 mg/dL — ABNORMAL HIGH (ref 70–99)
Glucose-Capillary: 197 mg/dL — ABNORMAL HIGH (ref 70–99)
Glucose-Capillary: 204 mg/dL — ABNORMAL HIGH (ref 70–99)
Glucose-Capillary: 224 mg/dL — ABNORMAL HIGH (ref 70–99)
Glucose-Capillary: 231 mg/dL — ABNORMAL HIGH (ref 70–99)

## 2019-01-31 LAB — C-REACTIVE PROTEIN: CRP: 1.3 mg/dL — ABNORMAL HIGH (ref <1.0)

## 2019-01-31 LAB — ECHOCARDIOGRAM COMPLETE
Height: 66 in
Weight: 2821.89 oz

## 2019-01-31 LAB — D-DIMER, QUANTITATIVE: D-Dimer, Quant: 0.61 ug/mL-FEU — ABNORMAL HIGH (ref 0.00–0.50)

## 2019-01-31 MED ORDER — DOCUSATE SODIUM 50 MG/5ML PO LIQD
100.0000 mg | Freq: Two times a day (BID) | ORAL | Status: DC
  Administered 2019-01-31 – 2019-02-01 (×3): 100 mg via ORAL
  Filled 2019-01-31 (×3): qty 10

## 2019-01-31 MED ORDER — VITAMIN C 500 MG PO TABS
250.0000 mg | ORAL_TABLET | Freq: Two times a day (BID) | ORAL | Status: DC
  Administered 2019-01-31 – 2019-02-02 (×4): 250 mg
  Filled 2019-01-31 (×4): qty 1

## 2019-01-31 MED ORDER — METHYLPREDNISOLONE SODIUM SUCC 125 MG IJ SOLR
60.0000 mg | Freq: Two times a day (BID) | INTRAMUSCULAR | Status: DC
  Administered 2019-01-31 – 2019-02-01 (×2): 60 mg via INTRAVENOUS
  Filled 2019-01-31 (×2): qty 2

## 2019-01-31 MED ORDER — DEXMEDETOMIDINE HCL IN NACL 400 MCG/100ML IV SOLN
0.0000 ug/kg/h | INTRAVENOUS | Status: DC
  Administered 2019-01-31: 0.4 ug/kg/h via INTRAVENOUS
  Administered 2019-01-31: 0.6 ug/kg/h via INTRAVENOUS
  Filled 2019-01-31 (×3): qty 100

## 2019-01-31 MED ORDER — NOREPINEPHRINE 4 MG/250ML-% IV SOLN
INTRAVENOUS | Status: AC
  Filled 2019-01-31: qty 250

## 2019-01-31 MED ORDER — EZETIMIBE 10 MG PO TABS
10.0000 mg | ORAL_TABLET | Freq: Every day | ORAL | Status: DC
  Administered 2019-02-01 – 2019-02-02 (×2): 10 mg
  Filled 2019-01-31 (×2): qty 1

## 2019-01-31 MED ORDER — MIDAZOLAM HCL 2 MG/2ML IJ SOLN: 1.0000 mg | INTRAMUSCULAR | Status: DC | PRN

## 2019-01-31 MED ORDER — IPRATROPIUM-ALBUTEROL 0.5-2.5 (3) MG/3ML IN SOLN
3.0000 mL | Freq: Four times a day (QID) | RESPIRATORY_TRACT | Status: DC
  Administered 2019-01-31 – 2019-02-01 (×4): 3 mL via RESPIRATORY_TRACT
  Filled 2019-01-31 (×4): qty 3

## 2019-01-31 MED ORDER — ETOMIDATE 2 MG/ML IV SOLN
INTRAVENOUS | Status: AC
  Administered 2019-01-31: 20 mg
  Filled 2019-01-31: qty 20

## 2019-01-31 MED ORDER — SODIUM CHLORIDE 0.9 % IV SOLN: 250.0000 mL | INTRAVENOUS | Status: DC

## 2019-01-31 MED ORDER — DOCUSATE SODIUM 50 MG/5ML PO LIQD: 100.0000 mg | Freq: Two times a day (BID) | ORAL | Status: DC | PRN

## 2019-01-31 MED ORDER — APIXABAN 5 MG PO TABS: 5.0000 mg | ORAL_TABLET | Freq: Two times a day (BID) | ORAL | Status: DC

## 2019-01-31 MED ORDER — CITALOPRAM HYDROBROMIDE 10 MG PO TABS
10.0000 mg | ORAL_TABLET | Freq: Every day | ORAL | Status: DC
  Administered 2019-02-01 – 2019-02-02 (×2): 10 mg
  Filled 2019-01-31 (×2): qty 1

## 2019-01-31 MED ORDER — FLUCONAZOLE 100MG IVPB
100.0000 mg | INTRAVENOUS | Status: DC
  Administered 2019-02-01 – 2019-02-02 (×2): 100 mg via INTRAVENOUS
  Filled 2019-01-31 (×2): qty 50

## 2019-01-31 MED ORDER — GABAPENTIN 250 MG/5ML PO SOLN
100.0000 mg | Freq: Three times a day (TID) | ORAL | Status: DC
  Filled 2019-01-31 (×4): qty 2

## 2019-01-31 MED ORDER — IPRATROPIUM-ALBUTEROL 0.5-2.5 (3) MG/3ML IN SOLN: 3.0000 mL | Freq: Four times a day (QID) | RESPIRATORY_TRACT | Status: DC

## 2019-01-31 MED ORDER — POTASSIUM CHLORIDE 20 MEQ/15ML (10%) PO SOLN
40.0000 meq | Freq: Every day | ORAL | Status: DC
  Administered 2019-02-01 – 2019-02-02 (×2): 40 meq
  Filled 2019-01-31 (×2): qty 30

## 2019-01-31 MED ORDER — SODIUM CHLORIDE 0.9 % IV SOLN
Freq: Once | INTRAVENOUS | Status: AC
  Administered 2019-01-31: 13:00:00 via INTRAVENOUS

## 2019-01-31 MED ORDER — PHENYLEPHRINE HCL-NACL 10-0.9 MG/250ML-% IV SOLN
INTRAVENOUS | Status: AC
  Filled 2019-01-31: qty 250

## 2019-01-31 MED ORDER — ENOXAPARIN SODIUM 80 MG/0.8ML ~~LOC~~ SOLN
80.0000 mg | Freq: Two times a day (BID) | SUBCUTANEOUS | Status: DC
  Administered 2019-01-31 – 2019-02-02 (×4): 80 mg via SUBCUTANEOUS
  Filled 2019-01-31 (×5): qty 0.8

## 2019-01-31 MED ORDER — FENTANYL CITRATE (PF) 100 MCG/2ML IJ SOLN: 25.0000 ug | INTRAMUSCULAR | Status: DC | PRN

## 2019-01-31 MED ORDER — INSULIN ASPART 100 UNIT/ML ~~LOC~~ SOLN
0.0000 [IU] | SUBCUTANEOUS | Status: DC
  Administered 2019-01-31: 3 [IU] via SUBCUTANEOUS
  Administered 2019-01-31 (×2): 5 [IU] via SUBCUTANEOUS
  Administered 2019-02-01 (×3): 3 [IU] via SUBCUTANEOUS
  Administered 2019-02-01: 05:00:00 5 [IU] via SUBCUTANEOUS
  Administered 2019-02-02: 09:00:00 2 [IU] via SUBCUTANEOUS

## 2019-01-31 MED ORDER — PHENYLEPHRINE HCL-NACL 10-0.9 MG/250ML-% IV SOLN
0.0000 ug/min | INTRAVENOUS | Status: DC
  Administered 2019-01-31: 20 ug/min via INTRAVENOUS

## 2019-01-31 MED ORDER — ORAL CARE MOUTH RINSE
15.0000 mL | OROMUCOSAL | Status: DC
  Administered 2019-01-31 – 2019-02-01 (×9): 15 mL via OROMUCOSAL

## 2019-01-31 MED ORDER — IPRATROPIUM-ALBUTEROL 20-100 MCG/ACT IN AERS
1.0000 | INHALATION_SPRAY | Freq: Four times a day (QID) | RESPIRATORY_TRACT | Status: DC
  Filled 2019-01-31: qty 4

## 2019-01-31 MED ORDER — HYDROCORTISONE NA SUCCINATE PF 100 MG IJ SOLR: 50.0000 mg | Freq: Once | INTRAMUSCULAR | Status: DC

## 2019-01-31 MED ORDER — LEVOTHYROXINE SODIUM 50 MCG PO TABS
50.0000 ug | ORAL_TABLET | Freq: Every day | ORAL | Status: DC
  Administered 2019-02-01 – 2019-02-02 (×2): 50 ug
  Filled 2019-01-31 (×2): qty 1

## 2019-01-31 MED ORDER — MONTELUKAST SODIUM 10 MG PO TABS
10.0000 mg | ORAL_TABLET | Freq: Every day | ORAL | Status: DC
  Administered 2019-01-31 – 2019-02-01 (×2): 10 mg
  Filled 2019-01-31 (×2): qty 1

## 2019-01-31 MED ORDER — ATORVASTATIN CALCIUM 40 MG PO TABS: 40.0000 mg | ORAL_TABLET | Freq: Every day | ORAL | Status: DC

## 2019-01-31 MED ORDER — PANTOPRAZOLE SODIUM 40 MG PO TBEC
40.0000 mg | DELAYED_RELEASE_TABLET | Freq: Every day | ORAL | Status: DC
  Administered 2019-01-31: 40 mg via ORAL
  Filled 2019-01-31: qty 1

## 2019-01-31 MED ORDER — CHLORHEXIDINE GLUCONATE 0.12% ORAL RINSE (MEDLINE KIT)
15.0000 mL | Freq: Two times a day (BID) | OROMUCOSAL | Status: DC
  Administered 2019-01-31: 15 mL via OROMUCOSAL

## 2019-01-31 MED ORDER — FLUCONAZOLE 100MG IVPB: 100.0000 mg | INTRAVENOUS | Status: DC

## 2019-01-31 MED ORDER — BISACODYL 10 MG RE SUPP
10.0000 mg | Freq: Every day | RECTAL | Status: DC | PRN
  Filled 2019-01-31: qty 1

## 2019-01-31 MED ORDER — ROCURONIUM BROMIDE 10 MG/ML (PF) SYRINGE
PREFILLED_SYRINGE | INTRAVENOUS | Status: AC
  Administered 2019-01-31: 80 mg
  Filled 2019-01-31: qty 10

## 2019-01-31 MED ORDER — FAMOTIDINE IN NACL 20-0.9 MG/50ML-% IV SOLN: 20.0000 mg | Freq: Two times a day (BID) | INTRAVENOUS | Status: DC

## 2019-01-31 MED ORDER — FENTANYL CITRATE (PF) 100 MCG/2ML IJ SOLN
INTRAMUSCULAR | Status: AC
  Administered 2019-01-31: 14:00:00 50 ug
  Filled 2019-01-31: qty 2

## 2019-01-31 MED ORDER — FLUCONAZOLE IN SODIUM CHLORIDE 200-0.9 MG/100ML-% IV SOLN
200.0000 mg | Freq: Once | INTRAVENOUS | Status: AC
  Administered 2019-01-31: 200 mg via INTRAVENOUS
  Filled 2019-01-31: qty 100

## 2019-01-31 MED ORDER — PHENYLEPHRINE HCL-NACL 10-0.9 MG/250ML-% IV SOLN: 0.0000 ug/min | INTRAVENOUS | Status: DC

## 2019-01-31 MED ORDER — PANTOPRAZOLE SODIUM 40 MG PO PACK
40.0000 mg | PACK | Freq: Every day | ORAL | Status: DC
  Administered 2019-02-01 – 2019-02-02 (×2): 40 mg
  Filled 2019-01-31 (×2): qty 20

## 2019-01-31 MED ORDER — MIDAZOLAM HCL 2 MG/2ML IJ SOLN
INTRAMUSCULAR | Status: AC
  Administered 2019-01-31: 2 mg
  Filled 2019-01-31: qty 4

## 2019-01-31 NOTE — Progress Notes (Signed)
Pt becoming more SOB and now having difficulty completing words. sats remain >90 on RA, RR up to 30's and HR now sustaining longer in the 130/140's. MD notified and charge called to bedside to assess pt. Pt has had increased work of breathing steadily since 0800. ICU team and RT called to bedside. Pt placed on non-rebreather mask and ABG performed. Pt readied for transport to ICU with ICU team and RT. Pt's son called and updated on pt's change in condition and given ICU contact info.

## 2019-01-31 NOTE — Progress Notes (Signed)
Physical Therapy Treatment Patient Details Name: [name]. MRN: [id number] DOB: [birth year] Today's Date: 01/31/2019    History of Present Illness [name]  is a 85 y.o. male,  with a known history of CAD, Chronic atrial fibrillation on Eliquis, Chronic diastolic CHF EF 83% in 6629, COPD on PRN 02 at home, CVA x 2, DM 2 and PAD status post left BKA in June 2020 who presented to the emergency room with complaints of fevers and was admitted for CAP at [facility], later Covid 19 came back +ve and was sent to [facility]    PT Comments    [name] appears much weaker, requires more assistance today for mobility. Patient currently requires a mechanical lift for transfers. PTA, patient used a sliding board with assist. Per RN report, patient up in rcliner all night. Assisted back to bed via maximove lift. Patient on 2 L [place], SPO2 .93%. Will need to reach out to family to determine if patient can return home with possibly requiring a lift. Patient currently is not strong enough to participate in trial of sliding board. Per Patient, he had 24/7 caregivers PTA.   Follow Up Recommendations  Home health PT;SNF     Equipment Recommendations  (mechanical lift)    Recommendations for Other Services       Precautions / Restrictions Precautions Precaution Comments: L BKA; watch sats    Mobility  Bed Mobility Overal bed mobility: Needs Assistance Bed Mobility: Rolling Rolling: Max assist;+2 for safety/equipment;+2 for physical assistance         General bed mobility comments: assist to rioll to each side, does reach for rail  Transfers                    Ambulation/Gait                 Stairs             Wheelchair Mobility    Modified Rankin (Stroke Patients Only)       Balance Overall balance assessment: Needs assistance Sitting-balance support: Bilateral upper extremity supported Sitting balance-Leahy Scale: Poor Sitting balance - Comments: pt  appears much weaker, required assist to sit upright inrecliner                                    Cognition Arousal/Alertness: Lethargic Behavior During Therapy: Restless Overall Cognitive Status: Difficult to assess                                 General Comments: patient very little verbalizations except he needed to have a BM. ,      Exercises      General Comments        Pertinent Vitals/Pain Pain Assessment: Faces Faces Pain Scale: Hurts little more Pain Location: generalized when rolling, transfering withlift Pain Descriptors / Indicators: Discomfort Pain Intervention(s): Monitored during session    Home Living                      Prior Function            PT Goals (current goals can now be found in the care plan section) Progress towards PT goals: Progressing toward goals    Frequency    Min 2X/week      PT Plan Current plan remains  appropriate    Co-evaluation              AM-PAC PT "6 Clicks" Mobility   Outcome Measure  Help needed turning from your back to your side while in a flat bed without using bedrails?: Total Help needed moving from lying on your back to sitting on the side of a flat bed without using bedrails?: Total Help needed moving to and from a bed to a chair (including a wheelchair)?: Total Help needed standing up from a chair using your arms (e.g., wheelchair or bedside chair)?: Total Help needed to walk in hospital room?: Total Help needed climbing 3-5 steps with a railing? : Total 6 Click Score: 6    End of Session Equipment Utilized During Treatment: Oxygen Activity Tolerance: Patient limited by fatigue Patient left: in bed;with call bell/phone within reach;with bed alarm set Nurse Communication: Mobility status;Need for lift equipment PT Visit Diagnosis: Muscle weakness (generalized) (M62.81)     Time: 8416-6063 PT Time Calculation (min) (ACUTE ONLY): 30 min  Charges:   $Therapeutic Activity: 23-37 mins                     [name] PT Acute Rehabilitation Services  Office [phone]    [name] 01/31/2019, 1:19 PM

## 2019-01-31 NOTE — Progress Notes (Signed)
NAME:  Shawn Devonshire., MRN:  [id number], DOB:  Dec 24, 1931, LOS: 4 ADMISSION DATE:  02/08/2019, CONSULTATION DATE:  10/21 REFERRING MD:  Waldron Labs, CHIEF COMPLAINT:  Dyspnea   Brief History   85 year old male admitted on October 17 for Covid pneumonia, treated with steroids and remdesivir, moved to the intensive care unit on October 21 in the setting of worsening respiratory failure without significant hypoxemia.  History of present illness   This is a complicated and chronically ill gentleman who is 78 and has multiple medical problems including COPD on as needed oxygen at home who was admitted to this facility with fevers and shortness of breath on October 17.  He was treated with steroids and remdesivir.  This morning he was resting comfortably on room air but developed a sudden onset worsening shortness of breath throughout the course of the day today.  It is unclear what happened.  There is no witnessed aspiration but the patient was noted to be eating around the time of his symptoms starting.  He currently notes that he is quite short of breath and wants to go on a ventilator.  He denies choking on food.  Past Medical History  Neuropathy History of nephrolithiasis Hypertension Hyperlipidemia GERD Diabetes mellitus type 2 History of stroke in 2016 and 1999 COPD on as needed oxygen at home Chronic diastolic heart failure, 6433 TTE> LVEF 65% on RV normal, valves OK Chronic atrial fibrillation Coronary artery disease  Significant Hospital Events   10/17 admission, steroids, remdesivir 10/21 sudden onset of dyspnea, moved to ICU for intubation  Consults:  PCCM  Procedures:  10/17 ETT >   Significant Diagnostic Tests:    Micro Data:  10/15 SARS COV 2 > positive 10/16 blood >  10/15 urine >   Antimicrobials/COVID Rx:  10/15 cefepime > 10/17 10/15 flagyl > 10/16 10/15 zosyn  10/16 remdesivir >  10/15 vancomycin x 1   Interim history/subjective:  As above   Objective   Blood pressure (!) 82/58, pulse (!) 107, temperature 98.2 F (36.8 C), temperature source Oral, resp. rate (!) 24, height 5\' 6"  (1.676 m), weight 80 kg, SpO2 100 %.    Vent Mode: PRVC FiO2 (%):  [100 %] 100 % Set Rate:  [24 bmp] 24 bmp Vt Set:  [510 mL] 510 mL PEEP:  [10 cmH20] 10 cmH20 Plateau Pressure:  [27 cmH20] 27 cmH20   Intake/Output Summary (Last 24 hours) at 01/31/2019 1400 Last data filed at 01/31/2019 1300 Gross per 24 hour  Intake 1530 ml  Output 3625 ml  Net -2095 ml   Filed Weights   01/31/2019 1413  Weight: 80 kg    Examination:  General:  Chronically ill appearing, increased work of breathing in bed HENT: NCAT OP clear PULM: Poor air movement B, increased respiratory effort, unable to speak in full sentences CV: RRR, no mgr GI: BS+, soft, nontender MSK: normal bulk and tone Neuro: awake, alert, no distress, MAEW  CXR images personally reviewed showing bilateral airspace disease  Resolved Hospital Problem list     Assessment & Plan:  Acute respiratory failure due to increase work of breathing, sudden hypoxemia on 10/21: given rapid development of hypoxemia: could this be aspiration? Acute pulmonary edema? COVID pneumonitis seems less likely with improving inflammatory biomarkers and recent lack of hypoxemia, PE seems less likely as he is on full dose lovenox Hoping this is something quickly reversible like aspiration or COPD exacerbation Intubate now Continue COVID treatment with methylprednisolone and remdesivir Check  echocardiogram Continue mechanical ventilation per conventional protocol TVol 8cc/kg Goal SaO2 > 85% Monitor plateau  COPD baseline, in exacerbation? Duoneb scheduled  Need for sedation for mechanical ventilation RASS target -1 to -2 Fentanyl prn precedex Versed prn  Hypotension after lasix/sedatin neosynephrine Bolus 1 L NS echo   Best practice:  Diet: tube feeding Pain/Anxiety/Delirium protocol (if  indicated): yes, RASS target -1 to -2 VAP protocol (if indicated): yes DVT prophylaxis: full dose lovenox GI prophylaxis: pantoprazole daily Glucose control: SSI Mobility: bed rest Code Status: FULL, discuss with family, would suggest limiting time on ventilator as overall prognosis poor Family Communication: per [facility] Disposition: remain in ICU  Labs   CBC: Recent Labs  Lab 01/25/19 1408  01/31/2019 0529 01/28/19 0440 01/29/19 0440 01/30/19 0420 01/31/19 0500 01/31/19 1253  WBC 9.1   < > 9.8 5.3 14.1* 8.8 12.6*  --   NEUTROABS 6.1  --   --  4.1 10.8* 7.3 10.3*  --   HGB 13.6   < > 13.2 12.7* 14.2 13.9 14.5 15.0  HCT 41.6   < > 41.9 40.9 46.4 44.6 45.5 44.0  MCV 87.2   < > 88.4 89.5 90.6 89.2 87.8  --   PLT 189   < > 173 165 216 210 228  --    < > = values in this interval not displayed.    Basic Metabolic Panel: Recent Labs  Lab 01/23/2019 0529 01/28/19 0440 01/29/19 0440 01/30/19 0420 01/31/19 0500 01/31/19 1253  NA 144 140 143 144 145 141  K 3.7 3.4* 3.5 3.8 3.7 3.4*  CL 106 103 103 103 105  --   CO2 28 28 31 30 29   --   GLUCOSE 90 244* 151* 239* 113*  --   BUN 54* 50* 48* 42* 40*  --   CREATININE 1.50* 1.40* 1.25* 1.22 0.95  --   CALCIUM 8.9 8.9 9.4 8.9 9.1  --   MG 2.2 2.2 2.4 2.2 2.3  --   PHOS 3.4  --   --   --   --   --    GFR: Estimated Creatinine Clearance: 56.5 mL/min (by C-G formula based on SCr of 0.95 mg/dL). Recent Labs  Lab 01/25/19 1408 01/25/19 1617  01/26/2019 0529 01/28/19 0440 01/29/19 0440 01/30/19 0420 01/31/19 0500  PROCALCITON  --   --    < > <0.10  --  <0.10 <0.10 <0.10  WBC 9.1  --    < > 9.8 5.3 14.1* 8.8 12.6*  LATICACIDVEN 3.1* 2.6*  --   --   --   --   --   --    < > = values in this interval not displayed.    Liver Function Tests: Recent Labs  Lab 01/25/19 1408 01/28/19 0440 01/29/19 0440 01/30/19 0420 01/31/19 0500  AST 26 15 16  13* 17  ALT 17 16 16 16 18   ALKPHOS 63 52 55 54 55  BILITOT 0.6 0.5 0.5 0.6 0.9  PROT  6.6 5.5* 6.1* 5.9* 6.3*  ALBUMIN 3.0* 2.4* 2.6* 2.6* 2.9*   No results for input(s): LIPASE, AMYLASE in the last 168 hours. No results for input(s): AMMONIA in the last 168 hours.  ABG    Component Value Date/Time   PHART 7.423 01/31/2019 1253   PCO2ART 45.8 01/31/2019 1253   PO2ART 50.0 (L) 01/31/2019 1253   HCO3 30.0 (H) 01/31/2019 1253   TCO2 31 01/31/2019 1253   ACIDBASEDEF 1.1 04/17/2018 1314   O2SAT 86.0 01/31/2019  1253     Coagulation Profile: No results for input(s): INR, PROTIME in the last 168 hours.  Cardiac Enzymes: No results for input(s): CKTOTAL, CKMB, CKMBINDEX, TROPONINI in the last 168 hours.  HbA1C: Hemoglobin A1C  Date/Time Value Ref Range Status  04/23/2014 04:16 AM 12.1 (H) 4.2 - 6.3 % Final    Comment:    The American Diabetes Association recommends that a primary goal of therapy should be <7% and that physicians should reevaluate the treatment regimen in patients with HbA1c values consistently >8%.    Hgb A1c MFr Bld  Date/Time Value Ref Range Status  01/26/2019 03:58 AM 6.7 (H) 4.8 - 5.6 % Final    Comment:    (NOTE) Pre diabetes:          5.7%-6.4% Diabetes:              >6.4% Glycemic control for   <7.0% adults with diabetes   09/11/2018 03:05 AM 7.4 (H) 4.8 - 5.6 % Final    Comment:    (NOTE) Pre diabetes:          5.7%-6.4% Diabetes:              >6.4% Glycemic control for   <7.0% adults with diabetes     CBG: Recent Labs  Lab 01/30/19 1153 01/30/19 1725 01/30/19 2051 01/31/19 0713 01/31/19 1110  GLUCAP 327* 410* 253* 102* 224*    Review of Systems:   Cannot obtain due to increased work of breathing  Past Medical History  He,  has a past medical history of Arthritis, CAD, Cancer ([place]), Chronic atrial fibrillation ([place]), Chronic diastolic CHF (congestive heart failure) ([place]), COPD (chronic obstructive pulmonary disease) ([place]), CVA (8099,8338), DM, Falls, GERD (gastroesophageal reflux disease), History of kidney stones,  HYPERLIPIDEMIA, HYPERTENSION, Kidney stone, Left arm weakness, Neuropathy of both feet, Poor balance, and Wears dentures.   Surgical History    Past Surgical History:  Procedure Laterality Date  . AMPUTATION Left 10/06/2018   Procedure: AMPUTATION BELOW KNEE;  Surgeon: Algernon Huxley, MD;  Location: [facility];  Service: General;  Laterality: Left;  . AMPUTATION TOE Left 06/13/2018   Procedure: 1st Ray Resection Left;  Surgeon: Sharlotte Alamo, DPM;  Location: [facility];  Service: Podiatry;  Laterality: Left;  . BLADDER SURGERY     stent placement   . CARDIAC CATHETERIZATION  1997   [facility]  . CAROTID STENT INSERTION  1997  . CATARACT EXTRACTION W/PHACO Left 10/11/2017   Procedure: CATARACT EXTRACTION PHACO AND INTRAOCULAR LENS PLACEMENT ([place]) COMPLICATED LEFT DIABETIC;  Surgeon: Leandrew Koyanagi, MD;  Location: [place];  Service: Ophthalmology;  Laterality: Left;  MALYUGIN Diabetic - insulin  . CATARACT EXTRACTION W/PHACO Right 11/30/2017   Procedure: CATARACT EXTRACTION PHACO AND INTRAOCULAR LENS PLACEMENT ([place]) COMPLICATED  RIGHT DIABETIC;  Surgeon: Leandrew Koyanagi, MD;  Location: [place];  Service: Ophthalmology;  Laterality: Right;  Diabetic - insulin  . CIRCUMCISION  2016  . CORONARY ANGIOPLASTY  1997   s/p stent placement x 2   . CYSTOSCOPY W/ URETERAL STENT REMOVAL Left 10/09/2014   Procedure: CYSTOSCOPY WITH STENT REMOVAL;  Surgeon: Hollice Espy, MD;  Location: [facility];  Service: Urology;  Laterality: Left;  . CYSTOSCOPY WITH STENT PLACEMENT Left 10/09/2014   Procedure: CYSTOSCOPY WITH STENT PLACEMENT;  Surgeon: Hollice Espy, MD;  Location: [facility];  Service: Urology;  Laterality: Left;  . IRRIGATION AND DEBRIDEMENT FOOT Left 09/15/2018   Procedure: IRRIGATION AND DEBRIDEMENT FOOT DELAY CLOSURE;  Surgeon: Vickki Muff,  Larkin Ina, DPM;  Location: [facility];  Service: Podiatry;  Laterality: Left;  . KIDNEY SURGERY  05/2013   s/p stent placement   . LOWER EXTREMITY  ANGIOGRAPHY Left 06/12/2018   Procedure: Lower Extremity Angiography;  Surgeon: Algernon Huxley, MD;  Location: [place] [facility];  Service: Cardiovascular;  Laterality: Left;  . LOWER EXTREMITY ANGIOGRAPHY Left 09/13/2018   Procedure: Lower Extremity Angiography;  Surgeon: Algernon Huxley, MD;  Location: [place] [facility];  Service: Cardiovascular;  Laterality: Left;  . stents ureters Bilateral   . TONSILLECTOMY AND ADENOIDECTOMY  1959  . URETEROSCOPY WITH HOLMIUM LASER LITHOTRIPSY Left 10/09/2014   Procedure: URETEROSCOPY WITH HOLMIUM LASER LITHOTRIPSY;  Surgeon: Hollice Espy, MD;  Location: [facility];  Service: Urology;  Laterality: Left;     Social History   reports that he quit smoking about 28 years ago. His smoking use included cigarettes. He has a 80.00 pack-year smoking history. He quit smokeless tobacco use about 28 years ago. He reports that he does not drink alcohol or use drugs.   Family History   His family history includes Diabetes in his maternal grandmother and mother; Heart disease in his maternal grandmother and mother. There is no history of Cancer or Stroke.   Allergies Allergies  Allergen Reactions  . Contrast Media [Iodinated Diagnostic Agents] Shortness Of Breath  . Morphine And Related Other (See Comments)    Hallucinations   . Niacin And Related Dermatitis  . Other     Other reaction(s): SHORTNESS OF BREATH  . Amlodipine Rash     Home Medications  Prior to Admission medications   Medication Sig Start Date End Date Taking? Authorizing Provider  ALPRAZolam (XANAX) 0.25 MG tablet Take 1 tablet (0.25 mg total) by mouth at bedtime as needed for anxiety. 11/20/18   Jearld Fenton, NP  atorvastatin (LIPITOR) 40 MG tablet TAKE 1 TABLET DAILY Patient taking differently: Take 40 mg by mouth daily at 6 PM.  01/12/19   Baity, Coralie Keens, NP  citalopram (CELEXA) 10 MG tablet TAKE 1 TABLET DAILY Patient taking differently: Take 10 mg by mouth daily.  12/28/18   Jearld Fenton, NP  docusate sodium (COLACE) 100 MG capsule Take 1 capsule (100 mg total) by mouth 2 (two) times daily. 10/10/18   Hillary Bow, MD  dorzolamide (TRUSOPT) 2 % ophthalmic solution Place 1 drop into both eyes 3 (three) times daily.  03/31/16   [provider]  ezetimibe (ZETIA) 10 MG tablet TAKE 1 TABLET DAILY 01/12/19   Jearld Fenton, NP  finasteride (PROSCAR) 5 MG tablet TAKE 1 TABLET DAILY 11/09/18   Jearld Fenton, NP  gabapentin (NEURONTIN) 100 MG capsule TAKE 1 CAPSULE THREE TIMES A DAY 11/19/18   Baity, Coralie Keens, NP  latanoprost (XALATAN) 0.005 % ophthalmic solution Place 1 drop into both eyes at bedtime.  04/18/16   [provider]  levothyroxine (SYNTHROID) 50 MCG tablet TAKE 1 TABLET DAILY BEFORE BREAKFAST Patient taking differently: Take 50 mcg by mouth daily before breakfast.  12/25/18   Jearld Fenton, NP  metoprolol succinate (TOPROL-XL) 50 MG 24 hr tablet TAKE 1 TABLET TWICE A DAY Patient taking differently: Take 50 mg by mouth 2 (two) times daily.  11/19/18   Jearld Fenton, NP  montelukast (SINGULAIR) 10 MG tablet TAKE 1 TABLET AT BEDTIME Patient taking differently: Take 10 mg by mouth at bedtime.  02/24/18   Jearld Fenton, NP  nitroGLYCERIN (NITROSTAT) 0.4 MG SL tablet  Place 0.4 mg under the tongue every 5 (five) minutes as needed for chest pain.     [provider]  silver sulfADIAZINE (SILVADENE) 1 % cream Apply 1 application topically daily.    [provider]  tamsulosin (FLOMAX) 0.4 MG CAPS capsule TAKE 1 CAPSULE DAILY Patient taking differently: Take 0.4 mg by mouth daily after supper.  12/25/18   Jearld Fenton, NP  traZODone (DESYREL) 50 MG tablet Take 2 tablets (100 mg total) by mouth at bedtime. 11/20/18   Jearld Fenton, NP  vitamin C (ASCORBIC ACID) 250 MG tablet Take 250 mg by mouth 2 (two) times daily.    [provider]     Critical care time: 35 minutes     Roselie Awkward, MD [place] [facility] Pager: [phone] Cell:  [phone] If no response, call [phone]

## 2019-01-31 NOTE — Progress Notes (Signed)
Pt requires bipap overnight at home, not able to receive here in the hospital, pt appears to be lethargic and having some "belly breathing". Nurse shared concerns with attending MD during rounds. New orders placed. Will continue to monitor.

## 2019-01-31 NOTE — Progress Notes (Signed)
PROGRESS NOTE                                                                                                                                                                                                             Patient Demographics:    [name], is a 85 y.o. male, DOB - 06-21-1931, NTI:[id number]  Admit date - 01/31/2019   Admitting Physician Costin Karlyne Greenspan, MD  Outpatient Primary MD for the patient is Garnette Gunner Coralie Keens, NP  LOS - 4   No chief complaint on file.      Brief Narrative     [name] a5 y.o.male,with a known history of CAD,Chronic atrial fibrillation on Eliquis,Chronic diastolic CHFEF 86% in 7619,JKDTOI PRN 02 at home,CVAx 2,DM 2and PADstatus post left BKA in June 2020complicated by a stump Ulcer at [facility] who presented to the emergency room with complaints of fevers and was admitted for CAP at [facility], later Covid 19 came back +ve and was sent to [facility], patient is extremely frail, has been doing good on room air to 2 L nasal cannula, on 10/21, had significant decompensation of respiratory status where he became significantly tachypneic, with oxygen requirement, and increased work of breathing, he is transferred to ICU where he required intubation by PCCM.   Subjective:    [name] today with worsening respiratory status and increased work of breathing, transferred to ICU and required intubation.   Assessment  & Plan :    Active Problems:   COVID-19 virus infection   Acute on chronic hypoxic respiratory failure due to acute COVID-19 pneumonia. -At baseline patient on room air to as needed oxygen requirement at home, patient with increased work of breathing 10/21, transferred to ICU and intubated. -Respiratory failure due to COVID-19 for pneumonia, but other contributing factors occluding baseline COPD, questionable aspiration pneumonia, and volume overload due to acute on chronic diastolic  CHF. -Vent management and sedation per PCCM. -We will hold diuresis given soft blood pressure. -Treated with IV steroids and IV remdesivir. -Increased his IV steroids dose earlier today given some wheezing noted and COPD exacerbation may be contributing to respiratory failure. -Continue with IV Unasyn for questionable aspiration pneumonia. -Unlikely pulmonary embolism especially in the setting of D-dimers 0.6, and patient is fully anticoagulated with Eliquis. -We will start on IV Diflucan given candidal infection  noted and throat during intubation. -I have discussed with son at length today, overall extremely poor prognosis given patient poor baseline, very fragile and multiple comorbidities, discussed goals of care, he reports patient wish to be resuscitated in the past, but not to be kept alive for prolonged.,  Have updated him we will continue with current measures currently, and if no improvement in 3 to 5 days, then will have to readdress goals of care and CODE STATUS.   COVID-19 Labs  Recent Labs    01/29/19 0440 01/30/19 0420 01/31/19 0500  DDIMER 0.62* 0.50 0.61*  CRP 0.9 2.5* 1.3*    Lab Results  Component Value Date   SARSCOV2NAA POSITIVE (A) 01/25/2019   [place] NEGATIVE 10/12/2018   [place] NEGATIVE 10/03/2018   [place] NEGATIVE 09/10/2018   Acute onChronic diastolic CHF.  - EF 10% on echocardiogram in 2016.  Initially on IV diuresis, with good urine output, currently on hold given soft blood pressure.  CAD. No acute issues home dose beta-blocker, statin &Zetia will be continued for secondary prevention.  Chronic atrial fibrillation with RVR - [place] vas 2 score of at least 3.  Currently heart rate uncontrolled, this is most likely due to acute illness, on low dose beta-blocker, if significantly remains uncontrolled, would consider Cardizem drip versus amnio(blood pressure is low). -will Have pharmacy transition Eliquis to full dose Lovenox secondary  to critical illness  Hypertension.  -We will discontinue metoprolol and there is in secondary to hypotension  Dyslipidemia. Stable on combination of statin and Zetia.  Recent left BKA. Came withLeft BKA and right heel ulcers which he came with at [facility]. Wound care will be consulted. No signs of active infection. See pictures aboveSupportive care. PT OT.  COPD. On as needed home oxygen.  Patient with some increased wheezing earlier today, on increased dose steroids  OSA. Oxygen at night at [facility].  Hypothyroidism - stable TSH, on Synthroid.  DM type II. On Lantus and sliding scale. Pre-meal NovoLog added. Recent A1c suggests stable outpatient control. Monitor and adjust   Code Status : Full Code  Family Communication  : Discussed with son, updated him about the events today  Consults  :  PCCM  Procedures  : intubation 10/21  DVT Prophylaxis  :  Eliquis>> Lovenox  Lab Results  Component Value Date   PLT 228 01/31/2019    Antibiotics  :    Anti-infectives (From admission, onward)   Start     Dose/Rate Route Frequency Ordered Stop   01/31/19 1400  fluconazole (DIFLUCAN) IVPB 100 mg     100 mg 50 mL/hr over 60 Minutes Intravenous Every 24 hours 01/31/19 1347     01/29/19 1115  Ampicillin-Sulbactam (UNASYN) 3 g in sodium chloride 0.9 % 100 mL IVPB     3 g 200 mL/hr over 30 Minutes Intravenous Every 6 hours 01/29/19 1100 02/01/19 1159   01/28/19 1000  remdesivir 100 mg in sodium chloride 0.9 % 250 mL IVPB     100 mg 500 mL/hr over 30 Minutes Intravenous Every 24 hours 01/29/2019 1143 01/30/19 1800   01/13/2019 1300  remdesivir 100 mg in sodium chloride 0.9 % 250 mL IVPB     100 mg 500 mL/hr over 30 Minutes Intravenous Every 24 hours 01/31/2019 1143 01/12/2019 1400        Objective:   Vitals:   01/31/19 0716 01/31/19 0800 01/31/19 1309 01/31/19 1334  BP: (!) 144/68   (!) 82/58  Pulse: 92 99 (!) 127 (!) 107  Resp: (!) 22 (!) 25 (!) 25 (!) 24  Temp: 98.2  F (36.8 C)     TempSrc: Oral     SpO2: 99% 92% 100% 100%  Weight:      Height:    5' 6" (1.676 m)    Wt Readings from Last 3 Encounters:  02/10/2019 80 kg  01/26/19 80.9 kg  12/26/18 78 kg     Intake/Output Summary (Last 24 hours) at 01/31/2019 1402 Last data filed at 01/31/2019 1300 Gross per 24 hour  Intake 1530 ml  Output 3625 ml  Net -2095 ml     Physical Exam  Sedated, intubated, in no apparent distress Symmetrical Chest wall movement, coarse respiratory sounds with scattered wheezing  Irregular irregular,No Gallops,Rubs or new Murmurs, No Parasternal Heave +ve B.Sounds, Abd Soft, No tenderness, No rebound - guarding or rigidity. Left BKA, bandaged,     Data Review:    CBC Recent Labs  Lab 01/25/19 1408  02/06/2019 0529 01/28/19 0440 01/29/19 0440 01/30/19 0420 01/31/19 0500 01/31/19 1253  WBC 9.1   < > 9.8 5.3 14.1* 8.8 12.6*  --   HGB 13.6   < > 13.2 12.7* 14.2 13.9 14.5 15.0  HCT 41.6   < > 41.9 40.9 46.4 44.6 45.5 44.0  PLT 189   < > 173 165 216 210 228  --   MCV 87.2   < > 88.4 89.5 90.6 89.2 87.8  --   MCH 28.5   < > 27.8 27.8 27.7 27.8 28.0  --   MCHC 32.7   < > 31.5 31.1 30.6 31.2 31.9  --   RDW 16.1*   < > 15.9* 15.9* 15.9* 15.9* 15.9*  --   LYMPHSABS 2.1  --   --  0.7 1.6 0.8 1.0  --   MONOABS 0.8  --   --  0.5 1.6* 0.7 1.2*  --   EOSABS 0.0  --   --  0.0 0.0 0.0 0.0  --   BASOSABS 0.0  --   --  0.0 0.0 0.0 0.0  --    < > = values in this interval not displayed.    Chemistries  Recent Labs  Lab 01/25/19 1408  01/30/2019 0529 01/28/19 0440 01/29/19 0440 01/30/19 0420 01/31/19 0500 01/31/19 1253  NA 139   < > 144 140 143 144 145 141  K 3.4*   < > 3.7 3.4* 3.5 3.8 3.7 3.4*  CL 94*   < > 106 103 103 103 105  --   CO2 31   < > _0 --   GLUCOSE 166*   < > 90 244* 151* 239* 113*  --   BUN 44*   < > 54* 50* 48* 42* 40*  --   CREATININE 1.80*   < > 1.50* 1.40* 1.25* 1.22 0.95  --   CALCIUM 8.5*   < > 8.9 8.9 9.4 8.9 9.1  --    MG  --    < > 2.2 2.2 2.4 2.2 2.3  --   AST 26  --   --  15 16 13* 17  --   ALT 17  --   --  _1 --   ALKPHOS 63  --   --  52 55 54 55  --   BILITOT 0.6  --   --  0.5 0.5 0.6 0.9  --    < > = values in this interval  not displayed.   ------------------------------------------------------------------------------------------------------------------ No results for input(s): CHOL, HDL, LDLCALC, TRIG, CHOLHDL, LDLDIRECT in the last 72 hours.  Lab Results  Component Value Date   HGBA1C 6.7 (H) 01/26/2019   ------------------------------------------------------------------------------------------------------------------ No results for input(s): TSH, T4TOTAL, T3FREE, THYROIDAB in the last 72 hours.  Invalid input(s): FREET3 ------------------------------------------------------------------------------------------------------------------ No results for input(s): VITAMINB12, FOLATE, FERRITIN, TIBC, IRON, RETICCTPCT in the last 72 hours.  Coagulation profile No results for input(s): INR, PROTIME in the last 168 hours.  Recent Labs    01/30/19 0420 01/31/19 0500  DDIMER 0.50 0.61*    Cardiac Enzymes No results for input(s): CKMB, TROPONINI, MYOGLOBIN in the last 168 hours.  Invalid input(s): CK ------------------------------------------------------------------------------------------------------------------    Component Value Date/Time   BNP 779.9 (H) 01/31/2019 0500    Inpatient Medications  Scheduled Meds:  apixaban  5 mg Oral BID   atorvastatin  40 mg Oral q1800   chlorhexidine  15 mL Mouth Rinse BID   chlorhexidine gluconate (MEDLINE KIT)  15 mL Mouth Rinse BID   Chlorhexidine Gluconate Cloth  6 each Topical Daily   citalopram  10 mg Oral Daily   collagenase   Topical Daily   docusate sodium  100 mg Oral BID   dorzolamide  1 drop Both Eyes TID   ezetimibe  10 mg Oral Daily   finasteride  5 mg Oral Daily   furosemide  40 mg Intravenous Daily    gabapentin  100 mg Oral TID   hydrALAZINE  50 mg Oral Q8H   insulin aspart  0-15 Units Subcutaneous TID WC   insulin aspart  0-5 Units Subcutaneous QHS   insulin aspart  7 Units Subcutaneous TID WC   insulin glargine  35 Units Subcutaneous BID   ipratropium-albuterol  3 mL Nebulization Q6H   latanoprost  1 drop Both Eyes QHS   levothyroxine  50 mcg Oral QAC breakfast   mouth rinse  15 mL Mouth Rinse q12n4p   mouth rinse  15 mL Mouth Rinse 10 times per day   methylPREDNISolone (SOLU-MEDROL) injection  60 mg Intravenous Q12H   metoprolol succinate  25 mg Oral BID   montelukast  10 mg Oral QHS   pantoprazole  40 mg Oral Daily   potassium chloride  40 mEq Oral Daily   sodium chloride flush  3 mL Intravenous Q12H   tamsulosin  0.4 mg Oral QPC supper   traZODone  100 mg Oral QHS   vitamin C  250 mg Oral BID   Continuous Infusions:  ampicillin-sulbactam (UNASYN) IV 3 g (01/31/19 1233)   dexmedetomidine (PRECEDEX) IV infusion     famotidine (PEPCID) IV     fluconazole (DIFLUCAN) IV     norepinephrine     phenylephrine (NEO-SYNEPHRINE) Adult infusion     phenylephrine     PRN Meds:.acetaminophen, ALPRAZolam, bisacodyl, docusate, fentaNYL (SUBLIMAZE) injection, fentaNYL (SUBLIMAZE) injection, magnesium citrate, metoprolol tartrate, midazolam, midazolam, nitroGLYCERIN, [DISCONTINUED] ondansetron **OR** ondansetron (ZOFRAN) IV  Micro Results Recent Results (from the past 240 hour(s))  Urine Culture     Status: None   Collection Time: 01/22/19 11:59 AM   Specimen: Urine  Result Value Ref Range Status   MICRO NUMBER: [id number]  Final   SPECIMEN QUALITY: Adequate  Final   Sample Source URINE  Final   STATUS: FINAL  Final   Result: No Growth  Final  Blood Culture (routine x 2)     Status: None   Collection Time: 01/25/19  2:08 PM   Specimen: BLOOD  Result Value Ref Range Status   Specimen Description BLOOD BLOOD LEFT HAND  Final   Special Requests   Final     BOTTLES DRAWN AEROBIC AND ANAEROBIC Blood Culture adequate volume   Culture   Final    NO GROWTH 5 DAYS Performed at [facility], [address], [place], [place] [address]    Report Status 01/30/2019 FINAL  Final  Blood Culture (routine x 2)     Status: None   Collection Time: 01/25/19  2:09 PM   Specimen: BLOOD  Result Value Ref Range Status   Specimen Description BLOOD BLOOD LEFT FOREARM  Final   Special Requests   Final    BOTTLES DRAWN AEROBIC AND ANAEROBIC Blood Culture adequate volume   Culture   Final    NO GROWTH 5 DAYS Performed at [facility], [address] [place], [place] [address]    Report Status 01/30/2019 FINAL  Final  SARS CORONAVIRUS 2 (TAT 6-24 HRS) Nasopharyngeal Nasopharyngeal Swab     Status: Abnormal   Collection Time: 01/25/19  2:09 PM   Specimen: Nasopharyngeal Swab  Result Value Ref Range Status   SARS Coronavirus 2 POSITIVE (A) NEGATIVE Final    Comment: RESULT CALLED TO, READ BACK BY AND VERIFIED WITH: RN B MILLER @ 0149 01/26/19 BY S GEZAHEGN (NOTE) SARS-CoV-2 target nucleic acids are DETECTED. The SARS-CoV-2 RNA is generally detectable in upper and lower respiratory specimens during the acute phase of infection. Positive results are indicative of active infection with SARS-CoV-2. Clinical  correlation with patient history and other diagnostic information is necessary to determine patient infection status. Positive results do  not rule out bacterial infection or co-infection with other viruses. The expected result is Negative. Fact Sheet for Patients: SugarRoll.be Fact Sheet for Healthcare Providers: https://www.woods-mathews.com/ This test is not yet approved or cleared by the [place] FDA and  has been authorized for detection and/or diagnosis of SARS-CoV-2 by FDA under an Emergency Use Authorization (EUA). This EUA will remain  in effect (meaning this test can be used)  for  the duration of the COVID-19 declaration under Section 564(b)(1) of the Act, 21 U.S.C. section 360bbb-3(b)(1), unless the authorization is terminated or revoked sooner. Performed at [facility], [place] [address] [place], [place] [address]   Urine culture     Status: None   Collection Time: 01/25/19  4:16 PM   Specimen: In/Out Cath Urine  Result Value Ref Range Status   Specimen Description   Final    IN/OUT CATH URINE Performed at [facility], [address], [place], [place] [address]    Special Requests   Final    NONE Performed at [facility], [address], [place], [place] [address]    Culture   Final    NO GROWTH Performed at [facility], [place] [address], [place], [place] [address]    Report Status 01/26/2019 FINAL  Final  MRSA PCR Screening     Status: None   Collection Time: 01/26/19  3:52 AM   Specimen: Nasopharyngeal  Result Value Ref Range Status   MRSA by PCR NEGATIVE NEGATIVE Final    Comment:        The GeneXpert MRSA Assay (FDA approved for NASAL specimens only), is one component of a comprehensive MRSA colonization surveillance program. It is not intended to diagnose MRSA infection nor to guide or monitor treatment for MRSA infections. Performed at [facility], [place],  [place], [place] [address]     Radiology Reports Dg Chest Port 1 View  Result Date: 01/31/2019 CLINICAL DATA:  Respiratory distress EXAM: PORTABLE CHEST 1 VIEW COMPARISON:  Radiograph 01/29/2019 FINDINGS: Stable enlarged cardiac silhouette. There is bilateral patchy airspace disease again demonstrated. There is improvement in the RIGHT lower lobe airspace disease with decrease in density. Mild LEFT basilar atelectasis. No pneumothorax. Pleural fluid. IMPRESSION: Bilateral diffuse airspace disease with improvement in RIGHT lower lobe Electronically Signed   By: Suzy Bouchard M.D.   On: 01/31/2019 13:20   Dg Chest Port 1  View  Result Date: 01/29/2019 CLINICAL DATA:  Shortness of breath EXAM: PORTABLE CHEST 1 VIEW COMPARISON:  January 25, 2019 FINDINGS: Significant worsening of infiltrate in the right lung primarily centrally. Stable cardiomegaly. There is a background of pulmonary venous congestion suspected. No pneumothorax. IMPRESSION: Significant worsening of pulmonary infiltrates on the right. This is suspected to represent infection given the patient's positive COVID-19 status. There may be a background of mild pulmonary venous congestion as well. Electronically Signed   By: Dorise Bullion III M.D   On: 01/29/2019 08:25   Dg Chest Port 1 View  Result Date: 01/25/2019 CLINICAL DATA:  Fever. Shortness of breath. EXAM: PORTABLE CHEST 1 VIEW COMPARISON:  10/13/2018 FINDINGS: There is cardiomegaly with pulmonary vascular congestion. There is haziness in the left perihilar region and in the right upper and lower lobes, probably representing mild pulmonary edema or possibly faint pneumonia. No discrete pleural effusions. No acute bone abnormality. Prominent degenerative changes of the right shoulder. IMPRESSION: 1. Hazy infiltrates in the left perihilar region and in the right upper and lower lobes. 2. Cardiomegaly with pulmonary vascular congestion. The hazy infiltrates could represent pulmonary edema or pneumonia. Electronically Signed   By: Lorriane Shire M.D.   On: 01/25/2019 14:46   Dg Knee Complete 4 Views Left  Result Date: 01/25/2019 CLINICAL DATA:  Fever. Soft tissue wounds on the left BKA stump. EXAM: LEFT KNEE - COMPLETE 4+ VIEW COMPARISON:  Radiographs dated 12/25/2017 FINDINGS: Surgical margins of the fibula and tibia are sharp with no radiographic evidence suggestive of osteomyelitis. No significant arthritic changes of the knee. Vascular stent in the distal left thigh. IMPRESSION: No significant abnormality. Specifically, no evidence of osteomyelitis in the tibia or fibula. Electronically Signed   By:  Lorriane Shire M.D.   On: 01/25/2019 14:48   Vas [place] Burnard Bunting With/wo Tbi  Result Date: 01/16/2019 LOWER EXTREMITY DOPPLER STUDY Indications: Peripheral artery disease, and Left leg venous stasis ulcers and              left BKA.  Comparison Study: 05/31/2018 Performing Technologist: Concha Norway RVT  Examination Guidelines: A complete evaluation includes at minimum, Doppler waveform signals and systolic blood pressure reading at the level of bilateral brachial, anterior tibial, and posterior tibial arteries, when vessel segments are accessible. Bilateral testing is considered an integral part of a complete examination. Photoelectric Plethysmograph (PPG) waveforms and toe systolic pressure readings are included as required and additional duplex testing as needed. Limited examinations for reoccurring indications may be performed as noted.  ABI Findings: +---------+------------------+-----+----------+--------+  Right     Rt Pressure (mmHg) Index Waveform   Comment   +---------+------------------+-----+----------+--------+  Brachial  157                                           +---------+------------------+-----+----------+--------+  ATA       140                0.89  monophasic           +---------+------------------+-----+----------+--------+  PTA       166                1.06  biphasic             +---------+------------------+-----+----------+--------+  Great Toe 128                0.82  Normal               +---------+------------------+-----+----------+--------+ +--------+------------------+-----+--------+-------+  Left     Lt Pressure (mmHg) Index Waveform Comment  +--------+------------------+-----+--------+-------+  Brachial 157                                        +--------+------------------+-----+--------+-------+  ATA                                        BKA      +--------+------------------+-----+--------+-------+  PTA                                        BKA       +--------+------------------+-----+--------+-------+ +-------+-----------+-----------+------------+------------+  ABI/TBI Today's ABI Today's TBI Previous ABI Previous TBI  +-------+-----------+-----------+------------+------------+  Right   1.06        .82                                    +-------+-----------+-----------+------------+------------+  Left    BKA                                                +-------+-----------+-----------+------------+------------+ Right ABIs appear essentially unchanged compared to prior study on 05/31/2018.  Summary: Right: Resting right ankle-brachial index is within normal range. No evidence of significant right lower extremity arterial disease. The right toe-brachial index is normal. Left: BKA.  *See table(s) above for measurements and observations.  Electronically signed by Leotis Pain MD on 01/16/2019 at 4:53:34 PM.   Final      Phillips Climes M.D on 01/31/2019 at 2:02 PM  Between 7am to 7pm - Pager - [phone]  After 7pm go to [email or web address] - password [facility]  [facility] -  Office  [phone]

## 2019-01-31 NOTE — Plan of Care (Signed)
  Pt slept well during the night. No complaints of pain verbalized. Alert and oriented. Vitals stable on RA, sats >95% overnight without needing prn O2. Minimal dyspnea on exertion and productive cough noted, prn Robitussin given. IS exercises completed. Moderate assist with ADLs. Foley catheter draining well.  Skin assessed, wound dressing on L stump, L knee, R heel, and sacral foam changed.  IV  SL. Pt preferred to sleep in recliner overnight, assisted  with regula repositioning for pressure relief. Unable to tolerate prone positioning. No other issues, will monitor.   Problem: Clinical Measurements: Goal: Ability to maintain clinical measurements within normal limits will improve Outcome: Progressing   Problem: Activity: Goal: Risk for activity intolerance will decrease Outcome: Progressing   Problem: Nutrition: Goal: Adequate nutrition will be maintained Outcome: Progressing   Problem: Coping: Goal: Level of anxiety will decrease Outcome: Progressing   Problem: Elimination: Goal: Will not experience complications related to bowel motility Outcome: Progressing Goal: Will not experience complications related to urinary retention Outcome: Progressing   Problem: Pain Managment: Goal: General experience of comfort will improve Outcome: Progressing   Problem: Safety: Goal: Ability to remain free from injury will improve Outcome: Progressing   Problem: Skin Integrity: Goal: Risk for impaired skin integrity will decrease Outcome: Progressing   Problem: Education: Goal: Knowledge of risk factors and measures for prevention of condition will improve Outcome: Progressing   Problem: Coping: Goal: Psychosocial and spiritual needs will be supported Outcome: Progressing   Problem: Respiratory: Goal: Will maintain a patent airway Outcome: Progressing Goal: Complications related to the disease process, condition or treatment will be avoided or minimized Outcome: Progressing

## 2019-01-31 NOTE — Progress Notes (Signed)
SLP Note  Patient Details Name: [name]. MRN: [id number] DOB: 1931-05-30   Pt with worsening respiratory status; now in ICU, intubated.  SLP will sign off.                                                                                                 Juan Quam Laurice 01/31/2019, 2:25 PM

## 2019-01-31 NOTE — Evaluation (Signed)
Clinical/Bedside Swallow Evaluation Patient Details  Name: [name]. MRN: [id number] Date of Birth: May 18, 1931  Today's Date: 01/31/2019 Time: SLP Start Time (ACUTE ONLY): 1113 SLP Stop Time (ACUTE ONLY): 1128 SLP Time Calculation (min) (ACUTE ONLY): 15 min  Past Medical History:  Past Medical History:  Diagnosis Date  . Arthritis   . CAD    a. MI 01/29/1996 tx'd w/ TPA @ [facility]; b. Myoview 06/2005: EF 50%, scar @ apex, mild peri-infarct ischemia  . Cancer ([place])    skin  . Chronic atrial fibrillation ([place])    a. since 2006; b. on warfarin  . Chronic diastolic CHF (congestive heart failure) ([place])    a. echo 04/2006: EF lower limits of nl, mod LVH, mild aortic root dilatation, & mild MR, biatrial enlargement; b. echo 04/2013: EF 60%, mod dilated LA, mild MR & TR, mod pulm HTN w/ RV systolic pressure 53, c. echo 04/21/14: EF 55-60%, unable to exclude WMA, severely dilated LA 6.6 cm, nl RVSP, mildly dilated aortic root  . COPD (chronic obstructive pulmonary disease) (HCC)    oxygen prn at home  . CVA 0109,3235   x2  . DM   . Falls   . GERD (gastroesophageal reflux disease)   . History of kidney stones   . HYPERLIPIDEMIA   . HYPERTENSION   . Kidney stone    a. s/p left ureteral stenting 04/24/14  . Left arm weakness    limited movement. S/P fall injury  . Neuropathy of both feet   . Poor balance   . Wears dentures    full upper and lower   Past Surgical History:  Past Surgical History:  Procedure Laterality Date  . AMPUTATION Left 10/06/2018   Procedure: AMPUTATION BELOW KNEE;  Surgeon: Algernon Huxley, MD;  Location: [facility];  Service: General;  Laterality: Left;  . AMPUTATION TOE Left 06/13/2018   Procedure: 1st Ray Resection Left;  Surgeon: Sharlotte Alamo, DPM;  Location: [facility];  Service: Podiatry;  Laterality: Left;  . BLADDER SURGERY     stent placement   . CARDIAC CATHETERIZATION  1997   [facility]  . CAROTID STENT INSERTION  1997  . CATARACT EXTRACTION W/PHACO Left  10/11/2017   Procedure: CATARACT EXTRACTION PHACO AND INTRAOCULAR LENS PLACEMENT ([place]) COMPLICATED LEFT DIABETIC;  Surgeon: Leandrew Koyanagi, MD;  Location: [facility];  Service: Ophthalmology;  Laterality: Left;  MALYUGIN Diabetic - insulin  . CATARACT EXTRACTION W/PHACO Right 11/30/2017   Procedure: CATARACT EXTRACTION PHACO AND INTRAOCULAR LENS PLACEMENT ([place]) COMPLICATED  RIGHT DIABETIC;  Surgeon: Leandrew Koyanagi, MD;  Location: [facility];  Service: Ophthalmology;  Laterality: Right;  Diabetic - insulin  . CIRCUMCISION  2016  . CORONARY ANGIOPLASTY  1997   s/p stent placement x 2   . CYSTOSCOPY W/ URETERAL STENT REMOVAL Left 10/09/2014   Procedure: CYSTOSCOPY WITH STENT REMOVAL;  Surgeon: Hollice Espy, MD;  Location: [facility];  Service: Urology;  Laterality: Left;  . CYSTOSCOPY WITH STENT PLACEMENT Left 10/09/2014   Procedure: CYSTOSCOPY WITH STENT PLACEMENT;  Surgeon: Hollice Espy, MD;  Location: [facility];  Service: Urology;  Laterality: Left;  . IRRIGATION AND DEBRIDEMENT FOOT Left 09/15/2018   Procedure: IRRIGATION AND DEBRIDEMENT FOOT DELAY CLOSURE;  Surgeon: Samara Deist, DPM;  Location: [facility];  Service: Podiatry;  Laterality: Left;  . KIDNEY SURGERY  05/2013   s/p stent placement   . LOWER EXTREMITY ANGIOGRAPHY Left 06/12/2018   Procedure: Lower Extremity Angiography;  Surgeon: Algernon Huxley, MD;  Location: [facility];  Service: Cardiovascular;  Laterality: Left;  . LOWER EXTREMITY ANGIOGRAPHY Left 09/13/2018   Procedure: Lower Extremity Angiography;  Surgeon: Algernon Huxley, MD;  Location: [facility];  Service: Cardiovascular;  Laterality: Left;  . stents ureters Bilateral   . TONSILLECTOMY AND ADENOIDECTOMY  1959  . URETEROSCOPY WITH HOLMIUM LASER LITHOTRIPSY Left 10/09/2014   Procedure: URETEROSCOPY WITH HOLMIUM LASER LITHOTRIPSY;  Surgeon: Hollice Espy, MD;  Location: [facility];  Service: Urology;  Laterality: Left;   HPI:  85 y.o.  male with a known history of CAD, Chronic atrial fibrillation on Eliquis, Chronic diastolic CHF EF 21% in 3086, COPD on PRN 02 at home, CVA x 2, DM 2 and PAD status post left BKA in June 2020 who presented to the emergency room with complaints of fevers and was admitted for CAP at [facility], later Covid test came back + and was sent to [facility].  Initial swallow evaluation 10/19 with recs for dysphagia 3 diet, thin liquids and no f/u.  10/21 am pt had coughing episodes with breakfast, precipitating new orders to repeat swallow assessment.    Assessment / Plan / Recommendation Clinical Impression  Returned for further swallow assessment after incident this am, during which pt had ongoing coughing with liquids at breakfast.  Pt alert but less interactive than during initial meeting.  WOB and RR increased.  Pt's clinical presentation appears to be similar to that which was noted during initial assessment - swallow response appears to be brisk; there are no overt s/s of aspiration; oral manipulation is [facility].  However, heightened RR and WOB may pose greater risk for aspiration, leading to difficulty coordinating the swallowing-breathing sequence.  Discussed with Mr. Beaumier the importance of taking frequent breaks when eating/drinking, allowing himself time to "catch his breath," not rushing through meals.  He verbalized understanding but appeared tired and uncomfortable.  Posted precautions at [facility]. SLP will follow for safety/precautions.  Continue same diet - dys3/thins - but hold tray when coughing.  SLP Visit Diagnosis: Dysphagia, unspecified (R13.10)    Aspiration Risk  Mild aspiration risk    Diet Recommendation   dysphagia 3, thin liquids  Medication Administration: Whole meds with puree    Other  Recommendations Oral Care Recommendations: Oral care BID   Follow up Recommendations Other (comment)(tba)      Frequency and Duration min 2x/week  2 weeks       Prognosis Prognosis for Safe Diet  Advancement: Good      Swallow Study   General Date of Onset: 02/01/2019 HPI: 85 y.o. male with a known history of CAD, Chronic atrial fibrillation on Eliquis, Chronic diastolic CHF EF 57% in 8469, COPD on PRN 02 at home, CVA x 2, DM 2 and PAD status post left BKA in June 2020 who presented to the emergency room with complaints of fevers and was admitted for CAP at [facility], later Covid test came back + and was sent to [facility].  Initial swallow evaluation 10/19 with recs for dysphagia 3 diet, thin liquids and no f/u.  10/21 am pt had coughing episodes with breakfast, precipitating new orders to repeat swallow assessment.  Type of Study: Bedside Swallow Evaluation Previous Swallow Assessment: (yes) Diet Prior to this Study: Dysphagia 3 (soft);Thin liquids Temperature Spikes Noted: No Respiratory Status: Nasal cannula(2 liters [place]) History of Recent Intubation: No Behavior/Cognition: Alert;Cooperative Oral Cavity Assessment: Within Functional Limits Oral Cavity - Dentition: Dentures, top;Dentures, bottom Vision: Functional for self-feeding Self-Feeding Abilities: Able to  feed self;Needs assist Patient Positioning: Upright in bed Baseline Vocal Quality: Normal Volitional Cough: Strong Volitional Swallow: Able to elicit    Oral/Motor/Sensory Function Overall Oral Motor/Sensory Function: Within functional limits   Ice Chips Ice chips: Within functional limits   Thin Liquid Thin Liquid: Within functional limits    Nectar Thick Nectar Thick Liquid: Not tested   Honey Thick Honey Thick Liquid: Not tested   Puree Puree: Within functional limits   Solid            Juan Quam Laurice 01/31/2019,11:46 AM  Estill Bamberg L. Tivis Ringer, [place] Office number [phone]

## 2019-01-31 NOTE — Progress Notes (Signed)
[place] for Lovenox Indication: atrial fibrillation  Allergies  Allergen Reactions  . Contrast Media [Iodinated Diagnostic Agents] Shortness Of Breath  . Morphine And Related Other (See Comments)    Hallucinations   . Niacin And Related Dermatitis  . Other     Other reaction(s): SHORTNESS OF BREATH  . Amlodipine Rash    Patient Measurements: Height: _0  (167.6 cm) Weight: 176 lb 5.9 oz (80 kg) IBW/kg (Calculated) : 63.8  Vital Signs: Temp: 98.2 F (36.8 C) (10/21 0716) Temp Source: Oral (10/21 0716) BP: 81/49 (10/21 1411) Pulse Rate: 82 (10/21 1411)  Labs: Recent Labs    01/29/19 0440 01/30/19 0420 01/31/19 0500 01/31/19 1253 01/31/19 1406  HGB 14.2 13.9 14.5 15.0 12.2*  HCT 46.4 44.6 45.5 44.0 36.0*  PLT 216 210 228  --   --   CREATININE 1.25* 1.22 0.95  --   --     Estimated Creatinine Clearance: 56.5 mL/min (by C-G formula based on SCr of 0.95 mg/dL).   Medications:  Scheduled:  . atorvastatin  40 mg Per Tube q1800  . chlorhexidine  15 mL Mouth Rinse BID  . chlorhexidine gluconate (MEDLINE KIT)  15 mL Mouth Rinse BID  . Chlorhexidine Gluconate Cloth  6 each Topical Daily  . [START ON 02/01/2019] citalopram  10 mg Per Tube Daily  . collagenase   Topical Daily  . docusate  100 mg Oral BID  . dorzolamide  1 drop Both Eyes TID  . enoxaparin (LOVENOX) injection  80 mg Subcutaneous Q12H  . [START ON 02/01/2019] ezetimibe  10 mg Per Tube Daily  . finasteride  5 mg Oral Daily  . furosemide  40 mg Intravenous Daily  . insulin aspart  0-15 Units Subcutaneous Q4H  . insulin aspart  7 Units Subcutaneous TID WC  . insulin glargine  35 Units Subcutaneous BID  . ipratropium-albuterol  3 mL Nebulization Q6H  . latanoprost  1 drop Both Eyes QHS  . [START ON 02/01/2019] levothyroxine  50 mcg Per Tube QAC breakfast  . mouth rinse  15 mL Mouth Rinse q12n4p  . mouth rinse  15 mL Mouth Rinse 10 times per day  .  methylPREDNISolone (SOLU-MEDROL) injection  60 mg Intravenous Q12H  . montelukast  10 mg Per Tube QHS  . [START ON 02/01/2019] pantoprazole sodium  40 mg Per Tube Daily  . [START ON 02/01/2019] potassium chloride  40 mEq Per Tube Daily  . sodium chloride flush  3 mL Intravenous Q12H  . tamsulosin  0.4 mg Oral QPC supper  . traZODone  100 mg Oral QHS  . vitamin C  250 mg Per Tube BID   Infusions:  . sodium chloride    . ampicillin-sulbactam (UNASYN) IV 3 g (01/31/19 1233)  . dexmedetomidine (PRECEDEX) IV infusion    . famotidine (PEPCID) IV    . fluconazole (DIFLUCAN) IV    . norepinephrine    . phenylephrine    . phenylephrine (NEO-SYNEPHRINE) Adult infusion      Assessment: 23 yoM admitted on 10/17 with COVID-19 pneumonia.  He was transferred to ICU and intubated on 10/21.  Pharmacy is consulted to change chronic PTA apixaban for Afib to Lovenox. Most recent apixaban given 10/21 AM. SCr 0.95 with CrCl > 30 ml/min CBC:  Hgb stable/wnl at 14.5 and Plt wnl at 228.   Goal of Therapy:  Anti-Xa level 0.6-1 units/ml 4hrs after LMWH dose given Monitor platelets by anticoagulation protocol: Yes   Plan:  D/C apixaban Lovenox 1 mg/kg (5m) [place] q12h Monitor renal function, CBC, s/s of bleeding.   CGretta ArabPharmD, BCPS Clinical pharmacist phone 7am- 5pm: [phone]/21/2020 2:35 PM

## 2019-01-31 NOTE — Progress Notes (Signed)
RT and ICU Charge RN responded to a Rapid Response call.  Patient has increased WOB.  ABG obtained. MD given ABG results, patient transferred to ICU.  [place] started at 100%, and 40 L.

## 2019-01-31 NOTE — Progress Notes (Signed)
Rapid Response called. Pt in respiratory distress.  ST/AFIB 120's. SATS 100 on NRB/. RR 44-50 C/o SOB, work of breathing increase. After speaking w/ MD tx to ICU

## 2019-01-31 NOTE — Progress Notes (Signed)
ANTICOAGULATION CONSULT NOTE - Follow Up Consult  Pharmacy Consult for Eliquis Indication: atrial fibrillation  Allergies  Allergen Reactions  . Contrast Media [Iodinated Diagnostic Agents] Shortness Of Breath  . Morphine And Related Other (See Comments)    Hallucinations   . Niacin And Related Dermatitis  . Other     Other reaction(s): SHORTNESS OF BREATH  . Amlodipine Rash    Patient Measurements: Height: 5\' 6"  (167.6 cm) Weight: 176 lb 5.9 oz (80 kg) IBW/kg (Calculated) : 63.8  Vital Signs: Temp: 98.2 F (36.8 C) (10/21 0716) Temp Source: Oral (10/21 0716) BP: 144/68 (10/21 0716) Pulse Rate: 95 (10/21 0341)  Labs: Recent Labs    01/29/19 0440 01/30/19 0420 01/31/19 0500  HGB 14.2 13.9 14.5  HCT 46.4 44.6 45.5  PLT 216 210 228  CREATININE 1.25* 1.22 0.95    Estimated Creatinine Clearance: 56.5 mL/min (by C-G formula based on SCr of 0.95 mg/dL).   Medical History: Past Medical History:  Diagnosis Date  . Arthritis   . CAD    a. MI 01/29/1996 tx'd w/ TPA @ [place]; b. Myoview 06/2005: EF 50%, scar @ apex, mild peri-infarct ischemia  . Cancer ([place])    skin  . Chronic atrial fibrillation ([place])    a. since 2006; b. on warfarin  . Chronic diastolic CHF (congestive heart failure) ([place])    a. echo 04/2006: EF lower limits of nl, mod LVH, mild aortic root dilatation, & mild MR, biatrial enlargement; b. echo 04/2013: EF 60%, mod dilated LA, mild MR & TR, mod pulm HTN w/ RV systolic pressure 53, c. echo 04/21/14: EF 55-60%, unable to exclude WMA, severely dilated LA 6.6 cm, nl RVSP, mildly dilated aortic root  . COPD (chronic obstructive pulmonary disease) (HCC)    oxygen prn at home  . CVA 0300,9233   x2  . DM   . Falls   . GERD (gastroesophageal reflux disease)   . History of kidney stones   . HYPERLIPIDEMIA   . HYPERTENSION   . Kidney stone    a. s/p left ureteral stenting 04/24/14  . Left arm weakness    limited movement. S/P fall injury  . Neuropathy of both  feet   . Poor balance   . Wears dentures    full upper and lower    Medications:  Eliquis 5 mg bid PTA  Assessment: 85 y/o M with a h/o atrial fibrillation on Elilquis PTA admitted with acute on chronic respiratory failure 2/2 COVID-19 pneumonitis.   Pt continue to be on Eliquis without issue. CBC wnl and scr back to baseline.   Plan:  Eliquis 5 mg twice daily   Onnie Boer, PharmD, [place], AAHIVP, CPP Infectious Disease Pharmacist 01/31/2019 9:09 AM

## 2019-01-31 NOTE — Procedures (Signed)
Intubation Procedure Note [name] [id number] [birth year]  Procedure: Intubation Indications: Respiratory insufficiency  Procedure Details Consent: Risks of procedure as well as the alternatives and risks of each were explained to the (patient/caregiver).  Consent for procedure obtained. Time Out: Verified patient identification, verified procedure, site/side was marked, verified correct patient position, special equipment/implants available, medications/allergies/relevent history reviewed, required imaging and test results available.  Performed  Drugs versed 64m IV, fentanyl 529m IV, Etomidate 2083mV, Rocuronium 51m2m DL x 1 with MAC 3 blade Grade 1 view 8.0 ET tube passed through cords under direct visualization Placement confirmed with bilateral breath sounds, positive EtCO2 change and smoke in tube   Evaluation Hemodynamic Status: BP stable throughout; O2 sats: stable throughout Patient's Current Condition: stable Complications: No apparent complications Patient did tolerate procedure well. Chest X-ray ordered to verify placement.  CXR: pending.   DougSimonne Maffucci21/2020

## 2019-02-01 DIAGNOSIS — J9601 Acute respiratory failure with hypoxia: Secondary | ICD-10-CM | POA: Diagnosis not present

## 2019-02-01 DIAGNOSIS — U071 COVID-19: Secondary | ICD-10-CM | POA: Diagnosis not present

## 2019-02-01 LAB — CBC
HCT: 43.5 % (ref 39.0–52.0)
Hemoglobin: 13.8 g/dL (ref 13.0–17.0)
MCH: 27.8 pg (ref 26.0–34.0)
MCHC: 31.7 g/dL (ref 30.0–36.0)
MCV: 87.7 fL (ref 80.0–100.0)
Platelets: 193 10*3/uL (ref 150–400)
RBC: 4.96 MIL/uL (ref 4.22–5.81)
RDW: 15.9 % — ABNORMAL HIGH (ref 11.5–15.5)
WBC: 7.7 10*3/uL (ref 4.0–10.5)
nRBC: 0 % (ref 0.0–0.2)

## 2019-02-01 LAB — COMPREHENSIVE METABOLIC PANEL
ALT: 16 U/L (ref 0–44)
AST: 19 U/L (ref 15–41)
Albumin: 2.4 g/dL — ABNORMAL LOW (ref 3.5–5.0)
Alkaline Phosphatase: 50 U/L (ref 38–126)
Anion gap: 14 (ref 5–15)
BUN: 42 mg/dL — ABNORMAL HIGH (ref 8–23)
CO2: 25 mmol/L (ref 22–32)
Calcium: 8.5 mg/dL — ABNORMAL LOW (ref 8.9–10.3)
Chloride: 110 mmol/L (ref 98–111)
Creatinine, Ser: 1.25 mg/dL — ABNORMAL HIGH (ref 0.61–1.24)
GFR calc Af Amer: >60 mL/min (ref >60)
GFR calc non Af Amer: 52 mL/min — ABNORMAL LOW (ref >60)
Glucose, Bld: 227 mg/dL — ABNORMAL HIGH (ref 70–99)
Potassium: 3.6 mmol/L (ref 3.5–5.1)
Sodium: 149 mmol/L — ABNORMAL HIGH (ref 135–145)
Total Bilirubin: 0.9 mg/dL (ref 0.3–1.2)
Total Protein: 5.2 g/dL — ABNORMAL LOW (ref 6.5–8.1)

## 2019-02-01 LAB — GLUCOSE, CAPILLARY
Glucose-Capillary: 201 mg/dL — ABNORMAL HIGH (ref 70–99)
Glucose-Capillary: 175 mg/dL — ABNORMAL HIGH (ref 70–99)
Glucose-Capillary: 152 mg/dL — ABNORMAL HIGH (ref 70–99)
Glucose-Capillary: 122 mg/dL — ABNORMAL HIGH (ref 70–99)
Glucose-Capillary: 95 mg/dL (ref 70–99)
Glucose-Capillary: 46 mg/dL — ABNORMAL LOW (ref 70–99)

## 2019-02-01 LAB — D-DIMER, QUANTITATIVE: D-Dimer, Quant: 0.66 ug/mL-FEU — ABNORMAL HIGH (ref 0.00–0.50)

## 2019-02-01 LAB — C-REACTIVE PROTEIN: CRP: <0.8 mg/dL (ref <1.0)

## 2019-02-01 LAB — BRAIN NATRIURETIC PEPTIDE: B Natriuretic Peptide: 528.6 pg/mL — ABNORMAL HIGH (ref 0.0–100.0)

## 2019-02-01 MED ORDER — DEXTROSE 50 % IV SOLN
INTRAVENOUS | Status: AC
  Administered 2019-02-01: 50 mL via INTRAVENOUS
  Filled 2019-02-01: qty 50

## 2019-02-01 MED ORDER — METHYLPREDNISOLONE SODIUM SUCC 40 MG IJ SOLR
30.0000 mg | Freq: Two times a day (BID) | INTRAMUSCULAR | Status: DC
  Administered 2019-02-01 – 2019-02-03 (×4): 30 mg via INTRAVENOUS
  Filled 2019-02-01 (×4): qty 1

## 2019-02-01 MED ORDER — INSULIN GLARGINE 100 UNIT/ML ~~LOC~~ SOLN
30.0000 [IU] | Freq: Two times a day (BID) | SUBCUTANEOUS | Status: DC
  Filled 2019-02-01 (×2): qty 0.3

## 2019-02-01 MED ORDER — DEXTROSE 50 % IV SOLN
25.0000 g | Freq: Once | INTRAVENOUS | Status: AC
  Administered 2019-02-01: 50 mL via INTRAVENOUS

## 2019-02-01 NOTE — Progress Notes (Signed)
Patient extubated to 6l [place] per MD order.  Leak test positive prior to extubation.  No stridor noted; patient able to vocalize with strong voice post extubation.  No complications noted.  Will continue to monitor.

## 2019-02-01 NOTE — Progress Notes (Signed)
Spoke with an updated pt's daughter at this time. I was able to answer all of the questions she had. She also was very appreciative of everything we are doing for him.

## 2019-02-01 NOTE — Progress Notes (Signed)
PT Cancellation Note  Patient Details Name: [name]. MRN: [id number] DOB: 10-30-1931   Cancelled Treatment:    Reason Eval/Treat Not Completed: Medical issues which prohibited therapy, to ICU, intubated. Will check back on Monday.   Claretha Cooper 02/01/2019, 7:18 AM [place]  Office [phone]

## 2019-02-01 NOTE — Progress Notes (Signed)
NAME:  Shawn Golob., MRN:  [id number], DOB:  08/01/1931, LOS: 5 ADMISSION DATE:  01/26/2019, CONSULTATION DATE:  10/21 REFERRING MD:  [facility], CHIEF COMPLAINT:  Dyspnea   Brief History   85 year old male admitted on October 17 for Covid pneumonia, treated with steroids and remdesivir, moved to the intensive care unit on October 21 in the setting of worsening respiratory failure without significant hypoxemia.  Past Medical History  Neuropathy History of nephrolithiasis Hypertension Hyperlipidemia GERD Diabetes mellitus type 2 History of stroke in 2016 and 1999 COPD on as needed oxygen at home Chronic diastolic heart failure, 7124 TTE> LVEF 65% on RV normal, valves OK Chronic atrial fibrillation Coronary artery disease  Significant Hospital Events   10/17 admission, steroids, remdesivir 10/21 sudden onset of dyspnea, moved to ICU for intubation 10/22 extubation  Consults:  PCCM  Procedures:  10/17 ETT > 10/22  Significant Diagnostic Tests:    Micro Data:  10/15 SARS COV 2 > positive 10/16 blood >  10/15 urine >   Antimicrobials/COVID Rx:  10/15 cefepime > 10/17 10/15 flagyl > 10/16 10/15 zosyn  10/16 remdesivir >  10/15 vancomycin x 1   Interim history/subjective:   Tolerating SBT this morning Following commands  Objective   Blood pressure 120/68, pulse 85, temperature 98.8 F (37.1 C), temperature source Oral, resp. rate (!) 24, height 5\' 6"  (1.676 m), weight 80 kg, SpO2 100 %.    Vent Mode: PRVC FiO2 (%):  [40 %-100 %] 40 % Set Rate:  [24 bmp] 24 bmp Vt Set:  [510 mL] 510 mL PEEP:  [4 cmH20-10 cmH20] 8 cmH20 Plateau Pressure:  [17 cmH20-27 cmH20] 22 cmH20   Intake/Output Summary (Last 24 hours) at 02/01/2019 5809 Last data filed at 02/01/2019 0600 Gross per 24 hour  Intake 1057.19 ml  Output 2415 ml  Net -1357.81 ml   Filed Weights   01/18/2019 1413  Weight: 80 kg    Examination:  General:  In bed on vent HENT: NCAT ETT in place  PULM: CTA B, vent supported breathing CV: RRR, no mgr GI: BS+, soft, nontender MSK: normal bulk and tone Neuro: awake on vent, follows commands   CXR images personally reviewed showing bilateral airspace disease  Resolved Hospital Problem list     Assessment & Plan:  Acute respiratory failure due to increase work of breathing 10/21, in retrospect per nursing who know him he has looked poorly every morning for several days.  He wears BIPAP at home, so perhaps breathing pattern/lethargy related to not receiving here? Confusing because he was not hypercarbic.  10/22 passing SBT; never had thick secretions to suggest aspiration Extubate NIMV in ICU setting tonight with dual viral filters If tolerates and does well tomorrow then consider HHF the following night which could be used on the floor Aspiration precautions  COPD baseline, in exacerbation? Change duoneb to combivent qid  Need for sedation for mechanical ventilation Stop sedation protocol  Hypotension after lasix/sedation: improved F/u echo Hemodynamic monitoring in ICU setting   Best practice:  Diet: tube feeding Pain/Anxiety/Delirium protocol (if indicated): d/c VAP protocol (if indicated): yes DVT prophylaxis: full dose lovenox GI prophylaxis: pantoprazole daily Glucose control: SSI Mobility: bed rest Code Status: FULL Family Communication: per [facility] Disposition: remain in ICU  Labs   CBC: Recent Labs  Lab 01/25/19 1408  01/28/19 0440 01/29/19 0440 01/30/19 0420 01/31/19 0500 01/31/19 1253 01/31/19 1406 02/01/19 0515  WBC 9.1   < > 5.3 14.1* 8.8 12.6*  --   --  7.7  NEUTROABS 6.1  --  4.1 10.8* 7.3 10.3*  --   --   --   HGB 13.6   < > 12.7* 14.2 13.9 14.5 15.0 12.2* 13.8  HCT 41.6   < > 40.9 46.4 44.6 45.5 44.0 36.0* 43.5  MCV 87.2   < > 89.5 90.6 89.2 87.8  --   --  87.7  PLT 189   < > 165 216 210 228  --   --  193   < > = values in this interval not displayed.    Basic Metabolic Panel: Recent  Labs  Lab 01/19/2019 0529 01/28/19 0440 01/29/19 0440 01/30/19 0420 01/31/19 0500 01/31/19 1253 01/31/19 1406 02/01/19 0515  NA 144 140 143 144 145 141 142 149*  K 3.7 3.4* 3.5 3.8 3.7 3.4* 3.3* 3.6  CL 106 103 103 103 105  --   --  110  CO2 28 28 31 30 29   --   --  25  GLUCOSE 90 244* 151* 239* 113*  --   --  227*  BUN 54* 50* 48* 42* 40*  --   --  42*  CREATININE 1.50* 1.40* 1.25* 1.22 0.95  --   --  1.25*  CALCIUM 8.9 8.9 9.4 8.9 9.1  --   --  8.5*  MG 2.2 2.2 2.4 2.2 2.3  --   --   --   PHOS 3.4  --   --   --   --   --   --   --    GFR: Estimated Creatinine Clearance: 43 mL/min (A) (by C-G formula based on SCr of 1.25 mg/dL (H)). Recent Labs  Lab 01/25/19 1408 01/25/19 1617  02/02/2019 0529  01/29/19 0440 01/30/19 0420 01/31/19 0500 02/01/19 0515  PROCALCITON  --   --    < > <0.10  --  <0.10 <0.10 <0.10  --   WBC 9.1  --    < > 9.8   < > 14.1* 8.8 12.6* 7.7  LATICACIDVEN 3.1* 2.6*  --   --   --   --   --   --   --    < > = values in this interval not displayed.    Liver Function Tests: Recent Labs  Lab 01/28/19 0440 01/29/19 0440 01/30/19 0420 01/31/19 0500 02/01/19 0515  AST 15 16 13* 17 19  ALT 16 16 16 18 16   ALKPHOS 52 55 54 55 50  BILITOT 0.5 0.5 0.6 0.9 0.9  PROT 5.5* 6.1* 5.9* 6.3* 5.2*  ALBUMIN 2.4* 2.6* 2.6* 2.9* 2.4*   No results for input(s): LIPASE, AMYLASE in the last 168 hours. No results for input(s): AMMONIA in the last 168 hours.  ABG    Component Value Date/Time   PHART 7.471 (H) 01/31/2019 1406   PCO2ART 44.3 01/31/2019 1406   PO2ART 526.0 (H) 01/31/2019 1406   HCO3 32.4 (H) 01/31/2019 1406   TCO2 34 (H) 01/31/2019 1406   ACIDBASEDEF 1.1 04/17/2018 1314   O2SAT 100.0 01/31/2019 1406     Coagulation Profile: No results for input(s): INR, PROTIME in the last 168 hours.  Cardiac Enzymes: No results for input(s): CKTOTAL, CKMB, CKMBINDEX, TROPONINI in the last 168 hours.  HbA1C: Hemoglobin A1C  Date/Time Value Ref Range Status   04/23/2014 04:16 AM 12.1 (H) 4.2 - 6.3 % Final    Comment:    The American Diabetes Association recommends that a primary goal of therapy should be <7% and that physicians  should reevaluate the treatment regimen in patients with HbA1c values consistently >8%.    Hgb A1c MFr Bld  Date/Time Value Ref Range Status  01/26/2019 03:58 AM 6.7 (H) 4.8 - 5.6 % Final    Comment:    (NOTE) Pre diabetes:          5.7%-6.4% Diabetes:              >6.4% Glycemic control for   <7.0% adults with diabetes   09/11/2018 03:05 AM 7.4 (H) 4.8 - 5.6 % Final    Comment:    (NOTE) Pre diabetes:          5.7%-6.4% Diabetes:              >6.4% Glycemic control for   <7.0% adults with diabetes     CBG: Recent Labs  Lab 01/31/19 1709 01/31/19 1953 01/31/19 2337 02/01/19 0446 02/01/19 0804  GLUCAP 231* 197* 204* 201* 175*     Critical care time: 45 minutes     Roselie Awkward, MD [place] P[facility]M Pager: [phone] Cell: [phone] If no response, call [phone]

## 2019-02-01 NOTE — Progress Notes (Addendum)
Inpatient Diabetes Program Recommendations  AACE/ADA: New Consensus Statement on Inpatient Glycemic Control (2015)  Target Ranges:  Prepandial:   less than 140 mg/dL      Peak postprandial:   less than 180 mg/dL (1-2 hours)      Critically ill patients:  140 - 180 mg/dL   Lab Results  Component Value Date   GLUCAP 175 (H) 02/01/2019   HGBA1C 6.7 (H) 01/26/2019    Review of Glycemic Control Results for STEFFON, GLADU" (MRN [id number]) as of 02/01/2019 11:46  Ref. Range 01/31/2019 19:53 01/31/2019 23:37 02/01/2019 04:46 02/01/2019 08:04  Glucose-Capillary Latest Ref Range: 70 - 99 mg/dL 197 (H) 204 (H) 201 (H) 175 (H)   Diabetes history: DM 2 Outpatient Diabetes medications:  None Current orders for Inpatient glycemic control:  Novolog moderate q 4 hours Novolog 7 units tid with meals Lantus 35 units bid Solumedrol 30 mg IV q 12 hours  Inpatient Diabetes Program Recommendations:    Please d/c Novolog 7 units tid with meals since patient is NPO.  If tube feeds are started, may need q 4 hour tube feed coverage.  Will follow.   Thanks  Adah Perl, RN, BC-ADM Inpatient Diabetes Coordinator Pager [phone] (8a-5p)

## 2019-02-01 NOTE — Progress Notes (Signed)
Spoke with pt's son Dominica Severin and gave him an update regarding his fathers condition. Will continue to monitor

## 2019-02-01 NOTE — Progress Notes (Signed)
Pt's daughter called and was updated on the pt's current condition. She want to be the first one to be called if something happens because her mom is currently sick too.

## 2019-02-01 NOTE — Progress Notes (Addendum)
[name].  NTZ:[id number] DOB: 01-28-1932 DOA: 02/01/2019 PCP: Jearld Fenton, NP    Brief Narrative:  85 year old with a history of CAD, chronic atrial fibrillation on Eliquis, chronic diastolic CHF, COPD, CVA x2, and DM 2, PAD status post BKA June 2020, and obstructive sleep apnea on nightly CPAP who presented to the ED with fever and was initially admitted to [facility] with a diagnosis of pneumonia.  When his Covid test returned positive he was transferred to [facility].  On 1021 he experienced an acute decompensation in which he exhibited marked increased work of breathing with tachypnea and altered mental status.  He was transferred to the ICU and intubated.  Significant Events: 10/15 admit to [facility] 10/17 transfer to [facility] 10/21 transfer to ICU - intubated  COVID-19 specific Treatment: Remdesivir 10/16 > 10/20 Decadron 10/16 Solu-Medrol 10/17 >  Subjective: Did well with a spontaneous breathing trial this morning.  Is awakening and will follow commands.  No evidence of acute respiratory distress.  Plan to extubate today.  Assessment & Plan:  Acute on chronic hypoxic respiratory failure - Covid pneumonia Exact etiology of the acute decline 10/21 unclear -much improved/stabilized at this time -plan is to extubate today -has completed 5-day course of remdesivir -will complete a 10-day course of steroid therapy  Recent Labs  Lab 01/26/19 0713 01/15/2019 0529 01/28/19 0440 01/29/19 0440 01/30/19 0420 01/31/19 0500 02/01/19 0515  DDIMER  --   --  0.42 0.62* 0.50 0.61* 0.66*  FERRITIN 260  --   --   --   --   --   --   CRP  --   --  1.6* 0.9 2.5* 1.3* <0.8  ALT  --   --  16 16 16 18 16   PROCALCITON 0.12 <0.10  --  <0.10 <0.10 <0.10  --     BiPAP dependent obstructive sleep apnea Perhaps his acute decline was related to inability to use BiPAP at [facility] each night, though a lack of severe hypercarbia argues against this -nonetheless we will give the patient  a trial of BiPAP the first night after his extubation and if he does well will been given a trial of heated high flow at night with subsequent night fall in the ICU setting  COPD with chronic hypoxic respiratory failure as needed home oxygen at 2 L/min  Acute onChronic diastolic CHF EF 67% on TTE 2016 -appears euvolemic at present  [facility] Weights   01/11/2019 1413  Weight: 80 kg    Mild hypernatremia Hold on further diuresis for now and monitor  CAD s/p CABG Continue beta-blocker, statin, and Zetia  Chronic atrial fibrillation with RVR CHA2DS2-VASc is 3 -chronic Eliquis transition to full dose Lovenox for now -chronic beta-blocker continues  Acute kidney injury on mild CKD baseline creatinine 1.2 -creatinine presently stable at approximate baseline  Recent Labs  Lab 01/28/19 0440 01/29/19 0440 01/30/19 0420 01/31/19 0500 02/01/19 0515  CREATININE 1.40* 1.25* 1.22 0.95 1.25*    Hypertension Blood pressure currently stable  Dyslipidemia Continue statin and Zetia  Recent left BKA L BKA and right heel ulcers noted at time of presentation to [facility] -continue wound care -continue PT/OT  Hypothyroidism Continue usual Synthroid dose -TSH stable  DM2 CBG controlled at the present time   DVT prophylaxis: Full dose Lovenox Code Status: FULL CODE Family Communication:  Disposition Plan: ICU  Consultants:  PCCM  Antimicrobials:  10/15 cefepime > 10/17 10/15 flagyl > 10/16 10/15 zosyn  10/15 vancomycin  Objective: Blood pressure 133/64, pulse 100, temperature 98.6 F (37 C), temperature source Oral, resp. rate 18, height 5\' 6"  (1.676 m), weight 80 kg, SpO2 100 %.  Intake/Output Summary (Last 24 hours) at 02/01/2019 1717 Last data filed at 02/01/2019 1400 Gross per 24 hour  Intake 1288.26 ml  Output 2140 ml  Net -851.74 ml   Filed Weights   01/17/2019 1413  Weight: 80 kg    Examination: General: No acute respiratory distress Lungs: Distant breath  sounds throughout with no wheezing or focal crackles Cardiovascular: Regular rate without murmur or rub appreciable Abdomen: Nontender, nondistended, soft, bowel sounds positive, no rebound, no ascites, no appreciable mass Extremities: No significant cyanosis, clubbing, or edema bilateral lower extremities  CBC: Recent Labs  Lab 01/29/19 0440 01/30/19 0420 01/31/19 0500 01/31/19 1253 01/31/19 1406 02/01/19 0515  WBC 14.1* 8.8 12.6*  --   --  7.7  NEUTROABS 10.8* 7.3 10.3*  --   --   --   HGB 14.2 13.9 14.5 15.0 12.2* 13.8  HCT 46.4 44.6 45.5 44.0 36.0* 43.5  MCV 90.6 89.2 87.8  --   --  87.7  PLT 216 210 228  --   --  671   Basic Metabolic Panel: Recent Labs  Lab 02/07/2019 0529  01/29/19 0440 01/30/19 0420 01/31/19 0500 01/31/19 1253 01/31/19 1406 02/01/19 0515  NA 144   < > 143 144 145 141 142 149*  K 3.7   < > 3.5 3.8 3.7 3.4* 3.3* 3.6  CL 106   < > 103 103 105  --   --  110  CO2 28   < > 31 30 29   --   --  25  GLUCOSE 90   < > 151* 239* 113*  --   --  227*  BUN 54*   < > 48* 42* 40*  --   --  42*  CREATININE 1.50*   < > 1.25* 1.22 0.95  --   --  1.25*  CALCIUM 8.9   < > 9.4 8.9 9.1  --   --  8.5*  MG 2.2   < > 2.4 2.2 2.3  --   --   --   PHOS 3.4  --   --   --   --   --   --   --    < > = values in this interval not displayed.   GFR: Estimated Creatinine Clearance: 43 mL/min (A) (by C-G formula based on SCr of 1.25 mg/dL (H)).  Liver Function Tests: Recent Labs  Lab 01/29/19 0440 01/30/19 0420 01/31/19 0500 02/01/19 0515  AST 16 13* 17 19  ALT 16 16 18 16   ALKPHOS 55 54 55 50  BILITOT 0.5 0.6 0.9 0.9  PROT 6.1* 5.9* 6.3* 5.2*  ALBUMIN 2.6* 2.6* 2.9* 2.4*    HbA1C: Hemoglobin A1C  Date/Time Value Ref Range Status  04/23/2014 04:16 AM 12.1 (H) 4.2 - 6.3 % Final    Comment:    The American Diabetes Association recommends that a primary goal of therapy should be <7% and that physicians should reevaluate the treatment regimen in patients with HbA1c  values consistently >8%.    Hgb A1c MFr Bld  Date/Time Value Ref Range Status  01/26/2019 03:58 AM 6.7 (H) 4.8 - 5.6 % Final    Comment:    (NOTE) Pre diabetes:          5.7%-6.4% Diabetes:              >  6.4% Glycemic control for   <7.0% adults with diabetes   09/11/2018 03:05 AM 7.4 (H) 4.8 - 5.6 % Final    Comment:    (NOTE) Pre diabetes:          5.7%-6.4% Diabetes:              >6.4% Glycemic control for   <7.0% adults with diabetes     CBG: Recent Labs  Lab 01/31/19 2337 02/01/19 0446 02/01/19 0804 02/01/19 1320 02/01/19 1603  GLUCAP 204* 201* 175* 152* 122*    Recent Results (from the past 240 hour(s))  Blood Culture (routine x 2)     Status: None   Collection Time: 01/25/19  2:08 PM   Specimen: BLOOD  Result Value Ref Range Status   Specimen Description BLOOD BLOOD LEFT HAND  Final   Special Requests   Final    BOTTLES DRAWN AEROBIC AND ANAEROBIC Blood Culture adequate volume   Culture   Final    NO GROWTH 5 DAYS Performed at [facility], [address] [place], [place] [address]    Report Status 01/30/2019 FINAL  Final  Blood Culture (routine x 2)     Status: None   Collection Time: 01/25/19  2:09 PM   Specimen: BLOOD  Result Value Ref Range Status   Specimen Description BLOOD BLOOD LEFT FOREARM  Final   Special Requests   Final    BOTTLES DRAWN AEROBIC AND ANAEROBIC Blood Culture adequate volume   Culture   Final    NO GROWTH 5 DAYS Performed at [facility], [address] [place], [place] [address]    Report Status 01/30/2019 FINAL  Final  SARS CORONAVIRUS 2 (TAT 6-24 HRS) Nasopharyngeal Nasopharyngeal Swab     Status: Abnormal   Collection Time: 01/25/19  2:09 PM   Specimen: Nasopharyngeal Swab  Result Value Ref Range Status   SARS Coronavirus 2 POSITIVE (A) NEGATIVE Final    Comment: RESULT CALLED TO, READ BACK BY AND VERIFIED WITH: RN B MILLER @ 0149 01/26/19 BY S GEZAHEGN (NOTE) SARS-CoV-2 target nucleic acids  are DETECTED. The SARS-CoV-2 RNA is generally detectable in upper and lower respiratory specimens during the acute phase of infection. Positive results are indicative of active infection with SARS-CoV-2. Clinical  correlation with patient history and other diagnostic information is necessary to determine patient infection status. Positive results do  not rule out bacterial infection or co-infection with other viruses. The expected result is Negative. Fact Sheet for Patients: SugarRoll.be Fact Sheet for Healthcare Providers: https://www.woods-mathews.com/ This test is not yet approved or cleared by the [place] FDA and  has been authorized for detection and/or diagnosis of SARS-CoV-2 by FDA under an Emergency Use Authorization (EUA). This EUA will remain  in effect (meaning this test can be used)  for the duration of the COVID-19 declaration under Section 564(b)(1) of the Act, 21 U.S.C. section 360bbb-3(b)(1), unless the authorization is terminated or revoked sooner. Performed at [facility], [place] [address], [place], [place] [address]   Urine culture     Status: None   Collection Time: 01/25/19  4:16 PM   Specimen: In/Out Cath Urine  Result Value Ref Range Status   Specimen Description   Final    IN/OUT CATH URINE Performed at [facility], [address] [place], [place] [address]    Special Requests   Final    NONE Performed at [facility], [address] [place],  [address]  Culture   Final    NO GROWTH Performed at [facility], [place] [address], [place], [place] [address]    Report Status 01/26/2019 FINAL  Final  MRSA PCR Screening     Status: None   Collection Time: 01/26/19  3:52 AM   Specimen: Nasopharyngeal  Result Value Ref Range Status   MRSA by PCR NEGATIVE NEGATIVE Final    Comment:        The GeneXpert MRSA Assay (FDA approved for NASAL specimens only), is one component  of a comprehensive MRSA colonization surveillance program. It is not intended to diagnose MRSA infection nor to guide or monitor treatment for MRSA infections. Performed at [facility], [address] [place], [place] [address]      Scheduled Meds: . Chlorhexidine Gluconate Cloth  6 each Topical Daily  . citalopram  10 mg Per Tube Daily  . collagenase   Topical Daily  . docusate  100 mg Oral BID  . dorzolamide  1 drop Both Eyes TID  . enoxaparin (LOVENOX) injection  80 mg Subcutaneous Q12H  . ezetimibe  10 mg Per Tube Daily  . finasteride  5 mg Oral Daily  . furosemide  40 mg Intravenous Daily  . insulin aspart  0-15 Units Subcutaneous Q4H  . insulin glargine  30 Units Subcutaneous BID  . latanoprost  1 drop Both Eyes QHS  . levothyroxine  50 mcg Per Tube QAC breakfast  . methylPREDNISolone (SOLU-MEDROL) injection  30 mg Intravenous Q12H  . montelukast  10 mg Per Tube QHS  . pantoprazole sodium  40 mg Per Tube Daily  . potassium chloride  40 mEq Per Tube Daily  . sodium chloride flush  3 mL Intravenous Q12H  . tamsulosin  0.4 mg Oral QPC supper  . traZODone  100 mg Oral QHS  . vitamin C  250 mg Per Tube BID   Continuous Infusions: . sodium chloride    . fluconazole (DIFLUCAN) IV Stopped (02/01/19 1108)  . phenylephrine (NEO-SYNEPHRINE) Adult infusion Stopped (01/31/19 1455)     LOS: 5 days   Cherene Altes, MD [facility] Office  [phone] Pager - Text Page per Amion  If 7PM-7AM, please contact night-coverage per Amion 02/01/2019, 5:17 PM

## 2019-02-01 NOTE — Progress Notes (Signed)
Speech Pathology:  Orders received for swallow evaluation.  SLP will be on campus of [facility] 10/23 for assessment.  Tremeka Helbling L. Tivis Ringer, [place] Office number [phone]

## 2019-02-02 ENCOUNTER — Inpatient Hospital Stay (HOSPITAL_COMMUNITY): Payer: Medicare Other

## 2019-02-02 DIAGNOSIS — U071 COVID-19: Secondary | ICD-10-CM | POA: Diagnosis not present

## 2019-02-02 DIAGNOSIS — J9601 Acute respiratory failure with hypoxia: Secondary | ICD-10-CM | POA: Diagnosis not present

## 2019-02-02 LAB — COMPREHENSIVE METABOLIC PANEL
ALT: 15 U/L (ref 0–44)
AST: 13 U/L — ABNORMAL LOW (ref 15–41)
Albumin: 2.5 g/dL — ABNORMAL LOW (ref 3.5–5.0)
Alkaline Phosphatase: 51 U/L (ref 38–126)
Anion gap: 10 (ref 5–15)
BUN: 48 mg/dL — ABNORMAL HIGH (ref 8–23)
CO2: 27 mmol/L (ref 22–32)
Calcium: 8.8 mg/dL — ABNORMAL LOW (ref 8.9–10.3)
Chloride: 114 mmol/L — ABNORMAL HIGH (ref 98–111)
Creatinine, Ser: 1.49 mg/dL — ABNORMAL HIGH (ref 0.61–1.24)
GFR calc Af Amer: 49 mL/min — ABNORMAL LOW (ref >60)
GFR calc non Af Amer: 42 mL/min — ABNORMAL LOW (ref >60)
Glucose, Bld: 87 mg/dL (ref 70–99)
Potassium: 3.6 mmol/L (ref 3.5–5.1)
Sodium: 151 mmol/L — ABNORMAL HIGH (ref 135–145)
Total Bilirubin: 0.9 mg/dL (ref 0.3–1.2)
Total Protein: 5.3 g/dL — ABNORMAL LOW (ref 6.5–8.1)

## 2019-02-02 LAB — D-DIMER, QUANTITATIVE: D-Dimer, Quant: 0.65 ug/mL-FEU — ABNORMAL HIGH (ref 0.00–0.50)

## 2019-02-02 LAB — CBC
HCT: 45.6 % (ref 39.0–52.0)
Hemoglobin: 14 g/dL (ref 13.0–17.0)
MCH: 28.1 pg (ref 26.0–34.0)
MCHC: 30.7 g/dL (ref 30.0–36.0)
MCV: 91.6 fL (ref 80.0–100.0)
Platelets: 218 10*3/uL (ref 150–400)
RBC: 4.98 MIL/uL (ref 4.22–5.81)
RDW: 16.7 % — ABNORMAL HIGH (ref 11.5–15.5)
WBC: 11.8 10*3/uL — ABNORMAL HIGH (ref 4.0–10.5)
nRBC: 0 % (ref 0.0–0.2)

## 2019-02-02 LAB — GLUCOSE, CAPILLARY
Glucose-Capillary: 133 mg/dL — ABNORMAL HIGH (ref 70–99)
Glucose-Capillary: 173 mg/dL — ABNORMAL HIGH (ref 70–99)
Glucose-Capillary: 203 mg/dL — ABNORMAL HIGH (ref 70–99)
Glucose-Capillary: 269 mg/dL — ABNORMAL HIGH (ref 70–99)
Glucose-Capillary: 82 mg/dL (ref 70–99)
Glucose-Capillary: 82 mg/dL (ref 70–99)

## 2019-02-02 LAB — C-REACTIVE PROTEIN: CRP: <0.8 mg/dL (ref <1.0)

## 2019-02-02 LAB — FERRITIN: Ferritin: 148 ng/mL (ref 24–336)

## 2019-02-02 MED ORDER — METOPROLOL SUCCINATE ER 50 MG PO TB24
50.0000 mg | ORAL_TABLET | Freq: Two times a day (BID) | ORAL | Status: DC
  Administered 2019-02-02 – 2019-02-03 (×2): 50 mg via ORAL
  Filled 2019-02-02 (×4): qty 1

## 2019-02-02 MED ORDER — INSULIN ASPART 100 UNIT/ML ~~LOC~~ SOLN
0.0000 [IU] | Freq: Three times a day (TID) | SUBCUTANEOUS | Status: DC
  Administered 2019-02-02: 17:00:00 3 [IU] via SUBCUTANEOUS
  Administered 2019-02-03 (×2): 2 [IU] via SUBCUTANEOUS
  Administered 2019-02-03: 08:00:00 3 [IU] via SUBCUTANEOUS
  Administered 2019-02-04: 18:00:00 5 [IU] via SUBCUTANEOUS
  Administered 2019-02-04: 09:00:00 2 [IU] via SUBCUTANEOUS
  Administered 2019-02-04: 13:00:00 3 [IU] via SUBCUTANEOUS
  Administered 2019-02-05 (×2): 5 [IU] via SUBCUTANEOUS
  Administered 2019-02-05 – 2019-02-06 (×2): 2 [IU] via SUBCUTANEOUS
  Administered 2019-02-06: 12:00:00 3 [IU] via SUBCUTANEOUS
  Administered 2019-02-06: 19:00:00 2 [IU] via SUBCUTANEOUS
  Administered 2019-02-07: 08:00:00 3 [IU] via SUBCUTANEOUS
  Administered 2019-02-07: 2 [IU] via SUBCUTANEOUS

## 2019-02-02 MED ORDER — PANTOPRAZOLE SODIUM 40 MG PO TBEC: 40.0000 mg | DELAYED_RELEASE_TABLET | Freq: Every day | ORAL | Status: DC

## 2019-02-02 MED ORDER — ALPRAZOLAM 0.5 MG PO TABS
0.2500 mg | ORAL_TABLET | Freq: Three times a day (TID) | ORAL | Status: DC | PRN
  Administered 2019-02-02 – 2019-02-03 (×2): 0.25 mg via ORAL
  Filled 2019-02-02 (×2): qty 1

## 2019-02-02 MED ORDER — VITAMIN C 500 MG PO TABS
250.0000 mg | ORAL_TABLET | Freq: Two times a day (BID) | ORAL | Status: DC
  Administered 2019-02-02 – 2019-02-03 (×2): 250 mg via ORAL
  Filled 2019-02-02 (×2): qty 1

## 2019-02-02 MED ORDER — LEVOTHYROXINE SODIUM 50 MCG PO TABS
50.0000 ug | ORAL_TABLET | Freq: Every day | ORAL | Status: DC
  Administered 2019-02-03 – 2019-02-09 (×7): 50 ug via ORAL
  Filled 2019-02-02 (×8): qty 1

## 2019-02-02 MED ORDER — INSULIN GLARGINE 100 UNIT/ML ~~LOC~~ SOLN
16.0000 [IU] | Freq: Two times a day (BID) | SUBCUTANEOUS | Status: DC
  Administered 2019-02-02: 12:00:00 16 [IU] via SUBCUTANEOUS
  Filled 2019-02-02 (×2): qty 0.16

## 2019-02-02 MED ORDER — MONTELUKAST SODIUM 10 MG PO TABS
10.0000 mg | ORAL_TABLET | Freq: Every day | ORAL | Status: DC
  Administered 2019-02-02 – 2019-02-08 (×7): 10 mg via ORAL
  Filled 2019-02-02 (×8): qty 1

## 2019-02-02 MED ORDER — PANTOPRAZOLE SODIUM 40 MG PO TBEC
40.0000 mg | DELAYED_RELEASE_TABLET | Freq: Every day | ORAL | Status: DC
  Administered 2019-02-03 – 2019-02-05 (×3): 40 mg via ORAL
  Filled 2019-02-02 (×4): qty 1

## 2019-02-02 MED ORDER — VITAMIN C 500 MG PO TABS: 250.0000 mg | ORAL_TABLET | Freq: Two times a day (BID) | ORAL | Status: DC

## 2019-02-02 MED ORDER — CITALOPRAM HYDROBROMIDE 10 MG PO TABS
10.0000 mg | ORAL_TABLET | Freq: Every day | ORAL | Status: DC
  Administered 2019-02-03 – 2019-02-09 (×6): 10 mg via ORAL
  Filled 2019-02-02 (×8): qty 1

## 2019-02-02 MED ORDER — POTASSIUM CHLORIDE CRYS ER 20 MEQ PO TBCR: 40.0000 meq | EXTENDED_RELEASE_TABLET | Freq: Every day | ORAL | Status: DC

## 2019-02-02 MED ORDER — FLUCONAZOLE 100 MG PO TABS
100.0000 mg | ORAL_TABLET | Freq: Every day | ORAL | Status: DC
  Administered 2019-02-03 – 2019-02-07 (×4): 100 mg via ORAL
  Filled 2019-02-02 (×6): qty 1

## 2019-02-02 MED ORDER — INSULIN ASPART 100 UNIT/ML ~~LOC~~ SOLN
0.0000 [IU] | Freq: Every day | SUBCUTANEOUS | Status: DC
  Administered 2019-02-02: 22:00:00 3 [IU] via SUBCUTANEOUS

## 2019-02-02 MED ORDER — DEXTROSE 5 % IV SOLN
INTRAVENOUS | Status: DC
  Administered 2019-02-02: 04:00:00 via INTRAVENOUS

## 2019-02-02 MED ORDER — DOCUSATE SODIUM 100 MG PO CAPS
100.0000 mg | ORAL_CAPSULE | Freq: Two times a day (BID) | ORAL | Status: DC
  Administered 2019-02-02 – 2019-02-05 (×8): 100 mg via ORAL
  Filled 2019-02-02 (×9): qty 1

## 2019-02-02 MED ORDER — APIXABAN 5 MG PO TABS
5.0000 mg | ORAL_TABLET | Freq: Two times a day (BID) | ORAL | Status: DC
  Administered 2019-02-02 – 2019-02-05 (×7): 5 mg via ORAL
  Filled 2019-02-02 (×8): qty 1

## 2019-02-02 MED ORDER — EZETIMIBE 10 MG PO TABS
10.0000 mg | ORAL_TABLET | Freq: Every day | ORAL | Status: DC
  Administered 2019-02-03: 11:00:00 10 mg via ORAL
  Filled 2019-02-02 (×2): qty 1

## 2019-02-02 NOTE — Progress Notes (Signed)
Physical Therapy Treatment Patient Details Name: [name]. MRN: [id number] DOB: 29-Jan-1932 Today's Date: 02/02/2019    History of Present Illness 85 y.o. male with PMH of CAD, atrial fibrillation on Eliquis, CHF, COPD (PRN 02 at home), CVA x2, DM2, and PAD s/p L BKA 09/2018, admitted 01/23/2019 to [place] with CAP; later tested (+) Covid with transfer to [facility].   Developed acute decompensation and AMS requiring intubation 10/21 and transfer to ICU. Extubated 10/22.   PT Comments    Pt seen now in ICU and extubated yesterday. Performing bed mobility with maxA; transferred to recliner with maximove lift, which pt tolerated great. Increased productive coughing with upright mobility; pt able to clear secretions. Practice incentive spirometer and flutter valve use, more productive coughing with these. Pt pleasant and motivated to participate. Pt could return home with [facility] services if family/PCAs able to provide necessary physical assist (was performing sliding board transfers to w/c PTA); if not, pt will require SNF-level therapies.  SpO2 >/96% on 4L O2 HFNC Resting HR 111, up to 130s-168 with mobility Resting BP 147/91, post-transfer BP 131/79    Follow Up Recommendations  Home health PT;SNF;Supervision/Assistance - 24 hour(home if PCAs can continued to provide necessary assist)     Equipment Recommendations  (hoyer lift)    Recommendations for Other Services       Precautions / Restrictions Precautions Precautions: Fall Precaution Comments: L BKA; tachycardia; currently on 4L HFNC (10/23) Restrictions Weight Bearing Restrictions: No    Mobility  Bed Mobility Overal bed mobility: Needs Assistance Bed Mobility: Rolling;Sidelying to Sit Rolling: Max assist Sidelying to sit: Max assist;HOB elevated       General bed mobility comments: MaxA for trunk elevation and scooting hips to EOB  Transfers Overall transfer level: Needs assistance               General  transfer comment: Maximove pad placed under pt while seated EOB, required cues for offloading each hip to place pad; transfer to recliner with maximove; pt tolerated well. HR up to 140s-168  Ambulation/Gait                 Stairs             Wheelchair Mobility    Modified Rankin (Stroke Patients Only)       Balance Overall balance assessment: Needs assistance   Sitting balance-Leahy Scale: Fair Sitting balance - Comments: Able to maintain static sitting with min guard; required assist to maintain balance when challenged                                    Cognition Arousal/Alertness: Awake/alert Behavior During Therapy: WFL for tasks assessed/performed Overall Cognitive Status: No family/caregiver present to determine baseline cognitive functioning                                 General Comments: Pt very pleasant and answering questions appropriately; minimal verbalizations unless asked. Some increased time processing      Exercises General Exercises - Lower Extremity Long Arc Quad: AROM;Right;Seated    General Comments General comments (skin integrity, edema, etc.): Resting vitals: HR 111, BP 147/91, SpO2 100% on 4L HFNC. HR 120s-168 with mobility, increased productive coughing. Post-transfer BP 131/79; SpO2 >90% on 4L O2. Practiced incentive spirometer and flutter valve      Pertinent  Vitals/Pain Pain Assessment: Faces Faces Pain Scale: Hurts a little bit Pain Location: Generalized Pain Descriptors / Indicators: Discomfort Pain Intervention(s): Monitored during session    Home Living                      Prior Function            PT Goals (current goals can now be found in the care plan section) Progress towards PT goals: Progressing toward goals    Frequency    Min 3X/week      PT Plan Frequency needs to be updated    Co-evaluation PT/OT/SLP Co-Evaluation/Treatment: Yes Reason for Co-Treatment:  Complexity of the patient's impairments (multi-system involvement);For patient/therapist safety;To address functional/ADL transfers PT goals addressed during session: Mobility/safety with mobility        AM-PAC PT "6 Clicks" Mobility   Outcome Measure  Help needed turning from your back to your side while in a flat bed without using bedrails?: A Lot Help needed moving from lying on your back to sitting on the side of a flat bed without using bedrails?: A Lot Help needed moving to and from a bed to a chair (including a wheelchair)?: Total Help needed standing up from a chair using your arms (e.g., wheelchair or bedside chair)?: Total Help needed to walk in hospital room?: Total Help needed climbing 3-5 steps with a railing? : Total 6 Click Score: 8    End of Session Equipment Utilized During Treatment: Oxygen Activity Tolerance: Patient tolerated treatment well Patient left: in chair;with call bell/phone within reach;with nursing/sitter in room Nurse Communication: Mobility status;Need for lift equipment(educ RN on use of maximove lift) PT Visit Diagnosis: Muscle weakness (generalized) (M62.81)     Time: 4235-3614 PT Time Calculation (min) (ACUTE ONLY): 42 min  Charges:  $Therapeutic Activity: 23-37 mins                    Mabeline Caras, PT, DPT Acute Rehabilitation Services  Pager [phone] Office [place] 02/02/2019, 10:46 AM

## 2019-02-02 NOTE — Progress Notes (Signed)
Pt taken off bipap and placed on 4L HFNC at this time. Pt denies SOB. No increased WOB, VS within normal limits. RT will continue to monitor

## 2019-02-02 NOTE — Progress Notes (Signed)
Assisted tele visit to patient with family member.  Jihaad Bruschi Anderson, RN   

## 2019-02-02 NOTE — Evaluation (Signed)
Clinical/Bedside Swallow Evaluation Patient Details  Name: [name]. MRN: [id number] Date of Birth: 09-14-1931  Today's Date: 02/02/2019 Time: SLP Start Time (ACUTE ONLY): 0845 SLP Stop Time (ACUTE ONLY): 0914 SLP Time Calculation (min) (ACUTE ONLY): 29 min  Past Medical History:  Past Medical History:  Diagnosis Date  . Arthritis   . CAD    a. MI 01/29/1996 tx'd w/ TPA @ [facility]; b. Myoview 06/2005: EF 50%, scar @ apex, mild peri-infarct ischemia  . Cancer ([place])    skin  . Chronic atrial fibrillation ([place])    a. since 2006; b. on warfarin  . Chronic diastolic CHF (congestive heart failure) ([place])    a. echo 04/2006: EF lower limits of nl, mod LVH, mild aortic root dilatation, & mild MR, biatrial enlargement; b. echo 04/2013: EF 60%, mod dilated LA, mild MR & TR, mod pulm HTN w/ RV systolic pressure 53, c. echo 04/21/14: EF 55-60%, unable to exclude WMA, severely dilated LA 6.6 cm, nl RVSP, mildly dilated aortic root  . COPD (chronic obstructive pulmonary disease) (H[facility])    oxygen prn at home  . CVA [id number]   x2  . DM   . Falls   . GERD (gastroesophageal reflux disease)   . History of kidney stones   . HYPERLIPIDEMIA   . HYPERTENSION   . Kidney stone    a. s/p left ureteral stenting 04/24/14  . Left arm weakness    limited movement. S/P fall injury  . Neuropathy of both feet   . Poor balance   . Wears dentures    full upper and lower   Past Surgical History:  Past Surgical History:  Procedure Laterality Date  . AMPUTATION Left 10/06/2018   Procedure: AMPUTATION BELOW KNEE;  Surgeon: Algernon Huxley, MD;  Location: [facility];  Service: General;  Laterality: Left;  . AMPUTATION TOE Left 06/13/2018   Procedure: 1st Ray Resection Left;  Surgeon: Sharlotte Alamo, DPM;  Location: [facility];  Service: Podiatry;  Laterality: Left;  . BLADDER SURGERY     stent placement   . CARDIAC CATHETERIZATION  1997   [facility]  . CAROTID STENT INSERTION  1997  . CATARACT EXTRACTION W/PHACO Left  10/11/2017   Procedure: CATARACT EXTRACTION PHACO AND INTRAOCULAR LENS PLACEMENT ([place]) COMPLICATED LEFT DIABETIC;  Surgeon: Leandrew Koyanagi, MD;  Location: [place];  Service: Ophthalmology;  Laterality: Left;  MALYUGIN Diabetic - insulin  . CATARACT EXTRACTION W/PHACO Right 11/30/2017   Procedure: CATARACT EXTRACTION PHACO AND INTRAOCULAR LENS PLACEMENT ([place]) COMPLICATED  RIGHT DIABETIC;  Surgeon: Leandrew Koyanagi, MD;  Location: [place];  Service: Ophthalmology;  Laterality: Right;  Diabetic - insulin  . CIRCUMCISION  2016  . CORONARY ANGIOPLASTY  1997   s/p stent placement x 2   . CYSTOSCOPY W/ URETERAL STENT REMOVAL Left 10/09/2014   Procedure: CYSTOSCOPY WITH STENT REMOVAL;  Surgeon: Hollice Espy, MD;  Location: [facility];  Service: Urology;  Laterality: Left;  . CYSTOSCOPY WITH STENT PLACEMENT Left 10/09/2014   Procedure: CYSTOSCOPY WITH STENT PLACEMENT;  Surgeon: Hollice Espy, MD;  Location: [facility];  Service: Urology;  Laterality: Left;  . IRRIGATION AND DEBRIDEMENT FOOT Left 09/15/2018   Procedure: IRRIGATION AND DEBRIDEMENT FOOT DELAY CLOSURE;  Surgeon: Samara Deist, DPM;  Location: [facility];  Service: Podiatry;  Laterality: Left;  . KIDNEY SURGERY  05/2013   s/p stent placement   . LOWER EXTREMITY ANGIOGRAPHY Left 06/12/2018   Procedure: Lower Extremity Angiography;  Surgeon: Algernon Huxley, MD;  Location: [place] [facility];  Service: Cardiovascular;  Laterality: Left;  . LOWER EXTREMITY ANGIOGRAPHY Left 09/13/2018   Procedure: Lower Extremity Angiography;  Surgeon: Algernon Huxley, MD;  Location: [place] [facility];  Service: Cardiovascular;  Laterality: Left;  . stents ureters Bilateral   . TONSILLECTOMY AND ADENOIDECTOMY  1959  . URETEROSCOPY WITH HOLMIUM LASER LITHOTRIPSY Left 10/09/2014   Procedure: URETEROSCOPY WITH HOLMIUM LASER LITHOTRIPSY;  Surgeon: Hollice Espy, MD;  Location: [facility];  Service: Urology;  Laterality: Left;   HPI:  85 y.o.  male with a known history of CAD, Chronic atrial fibrillation on Eliquis, Chronic diastolic CHF EF 41% in 9622, COPD on PRN 02 at home, CVA x 2, DM 2 and PAD status post left BKA in June 2020 who presented to the emergency room with complaints of fevers and was admitted for CAP at [facility], later Covid test came back + and was sent to [facility].  Initial swallow evaluation 10/19 with recs for dysphagia 3 diet, thin liquids and no f/u.  10/21 am pt had coughing episodes with breakfast, precipitating new orders to repeat swallow assessment.  Developed respiratory distress later in the day and was intubated, transferred to ICU.  Extubated 10/22.    Assessment / Plan / Recommendation Clinical Impression  Pt alert, participatory in swallow re-assessment.  RR well within necessary range to achieve adequate swallow/respiratory synchrony.  Pt demonstrated clear and appropriate exhalations post-swallow.  He presented with initial coughing with PO intake, but as assessment progressed he demonstrated no further s/s of aspiration.  Sp02 100% throughout.  Appears to be protecting airway with intake. Recommend initiating a full liquid diet; give meds whole in puree.  Pt should be sitting upright for all POs; he should take his time, allow rest breaks, and tray should be held if he struggles/coughs with meal.  D/W pt, RN.  SLP will follow- anticipate diet advancement as he continues to recover.  SLP Visit Diagnosis: Dysphagia, unspecified (R13.10)    Aspiration Risk  Mild aspiration risk    Diet Recommendation   full liquids  Medication Administration: Whole meds with puree    Other  Recommendations Oral Care Recommendations: Oral care BID   Follow up Recommendations        Frequency and Duration min 3x week  2 weeks       Prognosis Prognosis for Safe Diet Advancement: Good      Swallow Study   General Date of Onset: 02/10/2019 HPI: 85 y.o. male with a known history of CAD, Chronic atrial fibrillation on  Eliquis, Chronic diastolic CHF EF 29% in 7989, COPD on PRN 02 at home, CVA x 2, DM 2 and PAD status post left BKA in June 2020 who presented to the emergency room with complaints of fevers and was admitted for CAP at [facility], later Covid test came back + and was sent to [facility].  Initial swallow evaluation 10/19 with recs for dysphagia 3 diet, thin liquids and no f/u.  10/21 am pt had coughing episodes with breakfast, precipitating new orders to repeat swallow assessment.  Developed respiratory distress later in the day and was intubated, transferred to ICU.  Extubated 10/22.  Type of Study: Bedside Swallow Evaluation Previous Swallow Assessment: see HPI Diet Prior to this Study: NPO Temperature Spikes Noted: No Respiratory Status: Nasal cannula(HFNC) History of Recent Intubation: Yes Length of Intubations (days): 1 days Date extubated: 02/01/19 Behavior/Cognition: Alert;Cooperative Oral Cavity Assessment: Within Functional Limits Oral Care Completed by SLP: Recent completion by  staff Oral Cavity - Dentition: Dentures, top;Dentures, bottom(dentures removed and cleaned during session prior to POs) Vision: Functional for self-feeding Self-Feeding Abilities: Needs assist;Able to feed self Patient Positioning: Upright in bed Baseline Vocal Quality: Normal Volitional Cough: Strong Volitional Swallow: Able to elicit    Oral/Motor/Sensory Function Overall Oral Motor/Sensory Function: Within functional limits   Ice Chips Ice chips: Within functional limits   Thin Liquid Thin Liquid: Impaired Presentation: Straw;Cup Pharyngeal  Phase Impairments: Cough - Immediate;Throat Clearing - Immediate(initial coughing that subsided as assessment progressed)    Nectar Thick Nectar Thick Liquid: Within functional limits Presentation: Straw;Cup   Honey Thick Honey Thick Liquid: Not tested   Puree Puree: Within functional limits   Solid     Solid: Not tested      Juan Quam Laurice 02/02/2019,9:51  AM  Estill Bamberg L. Tivis Ringer, [place] Office number [phone]

## 2019-02-02 NOTE — Progress Notes (Signed)
NAME:  Shawn Fragoso., MRN:  [id number], DOB:  Jul 28, 1931, LOS: 6 ADMISSION DATE:  01/21/2019, CONSULTATION DATE:  10/21 REFERRING MD:  Waldron Labs, CHIEF COMPLAINT:  Dyspnea   Brief History   85 year old male admitted on October 17 for Covid pneumonia, treated with steroids and remdesivir, moved to the intensive care unit on October 21 in the setting of worsening respiratory failure without significant hypoxemia.  Past Medical History  Neuropathy History of nephrolithiasis Hypertension Hyperlipidemia GERD Diabetes mellitus type 2 History of stroke in 2016 and 1999 COPD on as needed oxygen at home Chronic diastolic heart failure, 3662 TTE> LVEF 65% on RV normal, valves OK Chronic atrial fibrillation Coronary artery disease  Significant Hospital Events   10/17 admission, steroids, remdesivir 10/21 sudden onset of dyspnea, moved to ICU for intubation 10/22 extubation, BIPAP overnight  Consults:  PCCM  Procedures:  10/17 ETT > 10/22  Significant Diagnostic Tests:    Micro Data:  10/15 SARS COV 2 > positive 10/16 blood >  10/15 urine >   Antimicrobials/COVID Rx:  10/15 cefepime > 10/17 10/15 flagyl > 10/16 10/15 zosyn  10/16 remdesivir >  10/15 vancomycin x 1   Interim history/subjective:   Extubated yesterday BIPAP overnight More awake, alert this morning  Objective   Blood pressure (!) 159/105, pulse (!) 114, temperature 98.1 F (36.7 C), resp. rate 20, height 5\' 6"  (1.676 m), weight 80 kg, SpO2 99 %.    Vent Mode: BIPAP FiO2 (%):  [40 %] 40 % PEEP:  [6 cmH20] 6 cmH20 Pressure Support:  [6 cmH20] 6 cmH20   Intake/Output Summary (Last 24 hours) at 02/02/2019 1405 Last data filed at 02/02/2019 0700 Gross per 24 hour  Intake 155.36 ml  Output 0 ml  Net 155.36 ml   Filed Weights   02/04/2019 1413  Weight: 80 kg    Examination:  General: Elderly male, resting comfortably in bed HENT: NCAT OP clear PULM: CTA B, normal effort CV: RRR, no mgr  GI: BS+, soft, nontender MSK: normal bulk and tone, s/p L AKA Neuro: awake, alert, no distress, MAEW  CXR images personally reviewed showing bilateral airspace disease  Resolved Hospital Problem list     Assessment & Plan:  Acute respiratory failure due to increase work of breathing 10/21, improved Seems that most of his trouble comes from sleep disordered breathing Heated high flow O2 tonight in lieu of BIPAP If doing OK tomorrow morning, then will use HHF oxygen on PCU 10/24 PM  COPD baseline, mild exacerbation? Combivent QID Continue low dose solumedrol   Best practice:  Diet: tube feeding Pain/Anxiety/Delirium protocol (if indicated): d/c VAP protocol (if indicated): yes DVT prophylaxis: full dose lovenox GI prophylaxis: pantoprazole daily Glucose control: SSI Mobility: bed rest Code Status: FULL Family Communication: per [facility] Disposition: remain in ICU  Labs   CBC: Recent Labs  Lab 01/28/19 0440 01/29/19 0440 01/30/19 0420 01/31/19 0500 01/31/19 1253 01/31/19 1406 02/01/19 0515 02/02/19 0550  WBC 5.3 14.1* 8.8 12.6*  --   --  7.7 11.8*  NEUTROABS 4.1 10.8* 7.3 10.3*  --   --   --   --   HGB 12.7* 14.2 13.9 14.5 15.0 12.2* 13.8 14.0  HCT 40.9 46.4 44.6 45.5 44.0 36.0* 43.5 45.6  MCV 89.5 90.6 89.2 87.8  --   --  87.7 91.6  PLT 165 216 210 228  --   --  193 947    Basic Metabolic Panel: Recent Labs  Lab 01/19/2019 0529 01/28/19  4742 01/29/19 0440 01/30/19 0420 01/31/19 0500 01/31/19 1253 01/31/19 1406 02/01/19 0515 02/02/19 0550  NA 144 140 143 144 145 141 142 149* 151*  K 3.7 3.4* 3.5 3.8 3.7 3.4* 3.3* 3.6 3.6  CL 106 103 103 103 105  --   --  110 114*  CO2 28 28 31 30 29   --   --  25 27  GLUCOSE 90 244* 151* 239* 113*  --   --  227* 87  BUN 54* 50* 48* 42* 40*  --   --  42* 48*  CREATININE 1.50* 1.40* 1.25* 1.22 0.95  --   --  1.25* 1.49*  CALCIUM 8.9 8.9 9.4 8.9 9.1  --   --  8.5* 8.8*  MG 2.2 2.2 2.4 2.2 2.3  --   --   --   --   PHOS 3.4   --   --   --   --   --   --   --   --    GFR: Estimated Creatinine Clearance: 36 mL/min (A) (by C-G formula based on SCr of 1.49 mg/dL (H)). Recent Labs  Lab 01/26/2019 0529  01/29/19 0440 01/30/19 0420 01/31/19 0500 02/01/19 0515 02/02/19 0550  PROCALCITON <0.10  --  <0.10 <0.10 <0.10  --   --   WBC 9.8   < > 14.1* 8.8 12.6* 7.7 11.8*   < > = values in this interval not displayed.    Liver Function Tests: Recent Labs  Lab 01/29/19 0440 01/30/19 0420 01/31/19 0500 02/01/19 0515 02/02/19 0550  AST 16 13* 17 19 13*  ALT 16 16 18 16 15   ALKPHOS 55 54 55 50 51  BILITOT 0.5 0.6 0.9 0.9 0.9  PROT 6.1* 5.9* 6.3* 5.2* 5.3*  ALBUMIN 2.6* 2.6* 2.9* 2.4* 2.5*   No results for input(s): LIPASE, AMYLASE in the last 168 hours. No results for input(s): AMMONIA in the last 168 hours.  ABG    Component Value Date/Time   PHART 7.471 (H) 01/31/2019 1406   PCO2ART 44.3 01/31/2019 1406   PO2ART 526.0 (H) 01/31/2019 1406   HCO3 32.4 (H) 01/31/2019 1406   TCO2 34 (H) 01/31/2019 1406   ACIDBASEDEF 1.1 04/17/2018 1314   O2SAT 100.0 01/31/2019 1406     Coagulation Profile: No results for input(s): INR, PROTIME in the last 168 hours.  Cardiac Enzymes: No results for input(s): CKTOTAL, CKMB, CKMBINDEX, TROPONINI in the last 168 hours.  HbA1C: Hemoglobin A1C  Date/Time Value Ref Range Status  04/23/2014 04:16 AM 12.1 (H) 4.2 - 6.3 % Final    Comment:    The American Diabetes Association recommends that a primary goal of therapy should be <7% and that physicians should reevaluate the treatment regimen in patients with HbA1c values consistently >8%.    Hgb A1c MFr Bld  Date/Time Value Ref Range Status  01/26/2019 03:58 AM 6.7 (H) 4.8 - 5.6 % Final    Comment:    (NOTE) Pre diabetes:          5.7%-6.4% Diabetes:              >6.4% Glycemic control for   <7.0% adults with diabetes   09/11/2018 03:05 AM 7.4 (H) 4.8 - 5.6 % Final    Comment:    (NOTE) Pre diabetes:           5.7%-6.4% Diabetes:              >6.4% Glycemic control for   <7.0% adults  with diabetes     CBG: Recent Labs  Lab 02/01/19 2321 02/01/19 2350 02/02/19 0327 02/02/19 0839 02/02/19 1314  GLUCAP 46* 173* 82 133* 82     Critical care time: n/a     Roselie Awkward, MD [place] [facility] Pager: [phone] Cell: [phone] If no response, call [phone]

## 2019-02-02 NOTE — Progress Notes (Signed)
Occupational Therapy Treatment Patient Details Name: [name]. MRN: [id number] DOB: [birth year] Today's Date: 02/02/2019    History of present illness 85 y.o. male with PMH of CAD, atrial fibrillation on Eliquis, CHF, COPD (PRN 02 at home), CVA x2, DM2, and PAD s/p L BKA 09/2018, admitted 01/24/2019 to [place] with CAP; later tested (+) Covid with transfer to [facility].   Developed acute decompensation and AMS requiring intubation 10/21 and transfer to ICU. Extubated 10/22.   OT comments  Pt very pleasant and cooperative. Able to progress to EOB with Max A. Occasional min A to regain balance EOB. Tachy into 150s with exercise and mobility. Maximove pad placed while sitting EOB to mobilize to recliner. Pt complete breathing exercises with incentive spirometer and flutter valve once in chair. Seated grooming tasks performed with set up. Pt's goal is to DC home. Pt had PCA PTA, but unsure what assistance she will be able to provide. Feel pt would benefit from SNF level therapy, however is home is plan, he will most likely need hoyer lift and 24/7 assistance. Will follow acutely.   Follow Up Recommendations  Home health OT;SNF;Supervision/Assistance - 24 hour(pending progress and available help)    Equipment Recommendations  None recommended by OT    Recommendations for Other Services      Precautions / Restrictions Precautions Precautions: Fall Precaution Comments: L BKA; tachycardia; currently on 4L HFNC (10/23) Restrictions Weight Bearing Restrictions: No       Mobility Bed Mobility Overal bed mobility: Needs Assistance Bed Mobility: Rolling;Sidelying to Sit Rolling: Max assist Sidelying to sit: Max assist;HOB elevated       General bed mobility comments: MaxA for trunk elevation and scooting hips to EOB  Transfers Overall transfer level: Needs assistance               General transfer comment: Maximove pad placed under pt while seated EOB, required cues for  offloading each hip to place pad; transfer to recliner with maximove; pt tolerated well. HR up to 140s-168    Balance Overall balance assessment: Needs assistance   Sitting balance-Leahy Scale: Fair Sitting balance - Comments: Able to maintain static sitting with min guard; required assist to maintain balance when challenged                                   ADL either performed or assessed with clinical judgement   ADL Overall ADL's : Needs assistance/impaired     Grooming: Set up;Oral care Grooming Details (indicate cue type and reason): needs assistance with hair. most likely did at baseline due to B shoulder limitations Upper Body Bathing: Moderate assistance   Lower Body Bathing: Total assistance   Upper Body Dressing : Maximal assistance;Sitting   Lower Body Dressing: Total assistance;Bed level               Functional mobility during ADLs: (EOB with Max A +2) General ADL Comments: Mobilized to chair with [employer]     Vision       Perception     Praxis      Cognition Arousal/Alertness: Awake/alert Behavior During Therapy: WFL for tasks assessed/performed Overall Cognitive Status: No family/caregiver present to determine baseline cognitive functioning                                 General Comments: Pt very pleasant  and answering questions appropriately; minimal verbalizations unless asked. Some increased time processing        Exercises Exercises: Other exercises General Exercises - Lower Extremity Long Arc Quad: AROM;Right;Seated Other Exercises Other Exercises: incentive spirometer x 10 Other Exercises: flutter valve x 10   Shoulder Instructions       General Comments tachy into 150s with mobility/exercise; SpO2 100 on 4L HFNC    Pertinent Vitals/ Pain       Pain Assessment: Faces Faces Pain Scale: Hurts a little bit Pain Location: Generalized Pain Descriptors / Indicators: Discomfort Pain Intervention(s):  Limited activity within patient's tolerance  Home Living                                          Prior Functioning/Environment              Frequency  Min 2X/week        Progress Toward Goals  OT Goals(current goals can now be found in the care plan section)  Progress towards OT goals: Progressing toward goals  Acute Rehab OT Goals Patient Stated Goal: to get healthy and go home OT Goal Formulation: With patient Time For Goal Achievement: 02/16/19 Potential to Achieve Goals: Good ADL Goals Pt Will Transfer to Toilet: with min assist;with transfer board;regular height toilet;grab bars;bedside commode Additional ADL Goal #1: Pt will complete bed mobility at min A in preparation for BADL Additional ADL Goal #2: Pt will recall and/or apply 103 ECS strategies while engaging in ADL task for safe and succesful completion Additional ADL Goal #3: Pt will sustain O2 sats above 90 during BADL activity  Plan Discharge plan remains appropriate    Co-evaluation    PT/OT/SLP Co-Evaluation/Treatment: Yes Reason for Co-Treatment: Complexity of the patient's impairments (multi-system involvement);For patient/therapist safety;To address functional/ADL transfers PT goals addressed during session: Mobility/safety with mobility OT goals addressed during session: ADL's and self-care;Strengthening/ROM      AM-PAC OT "6 Clicks" Daily Activity     Outcome Measure   Help from another person eating meals?: A Little Help from another person taking care of personal grooming?: A Lot Help from another person toileting, which includes using toliet, bedpan, or urinal?: Total Help from another person bathing (including washing, rinsing, drying)?: A Lot Help from another person to put on and taking off regular upper body clothing?: A Lot Help from another person to put on and taking off regular lower body clothing?: Total 6 Click Score: 11    End of Session Equipment Utilized  During Treatment: Oxygen(4L)  OT Visit Diagnosis: Other abnormalities of gait and mobility (R26.89);Muscle weakness (generalized) (M62.81)   Activity Tolerance Patient tolerated treatment well   Patient Left in chair;with call bell/phone within reach;with chair alarm set   Nurse Communication Mobility status;Need for lift equipment        Time: 3159-4585 OT Time Calculation (min): 42 min  Charges: OT Treatments $Self Care/Home Management : 8-22 mins  Maurie Boettcher, OT/L   Acute OT Clinical Specialist Acute Rehabilitation Services Pager [phone] Office [phone]    [facility] 02/02/2019, 11:26 AM

## 2019-02-02 NOTE — Progress Notes (Addendum)
[name].  CZY:[id number] DOB: 1931/05/03 DOA: 01/15/2019 PCP: Jearld Fenton, NP    Brief Narrative:  85 year old with a history of CAD, chronic atrial fibrillation on Eliquis, chronic diastolic CHF, COPD, CVA x2, and DM 2, PAD status post BKA June 2020, and obstructive sleep apnea on nightly CPAP who presented to the ED with fever and was initially admitted to [facility] with a diagnosis of pneumonia.  When his Covid test returned positive he was transferred to [facility].  On 1021 he experienced an acute decompensation in which he exhibited marked increased work of breathing with tachypnea and altered mental status.  He was transferred to the ICU and intubated.  Significant Events: 10/15 admit to [facility] 10/17 transfer to [facility] 10/21 transfer to ICU - intubated  COVID-19 specific Treatment: Remdesivir 10/16 > 10/20 Decadron 10/16 Solu-Medrol 10/17 >  Subjective: Tolerated BiPAP well last night.  Suffered a hypoglycemic episode around midnight.  Alert conversant and oriented at time of exam today.  Appears to be doing quite well.  Is motivated to get up and get moving with therapy.  Assessment & Plan:  Acute on chronic hypoxic respiratory failure - Covid pneumonia Exact etiology of the acute decline 10/21 unclear - much improved/stabilized at this time -now extubated and tolerated BiPAP last night without difficulty - has completed 5-day course of remdesivir -will complete a 10-day course of steroid therapy  Recent Labs  Lab 02/02/2019 0529  01/29/19 0440 01/30/19 0420 01/31/19 0500 02/01/19 0515 02/02/19 0550  DDIMER  --    < > 0.62* 0.50 0.61* 0.66* 0.65*  FERRITIN  --   --   --   --   --   --  148  CRP  --    < > 0.9 2.5* 1.3* <0.8 <0.8  ALT  --    < > 16 16 18 16 15   PROCALCITON <0.10  --  <0.10 <0.10 <0.10  --   --    < > = values in this interval not displayed.    BiPAP dependent obstructive sleep apnea Perhaps his acute decline was related to inability  to use BiPAP at [facility] each night, though a lack of severe hypercarbia argues against this -tolerated BiPAP last night -transition to trial of high flow nasal cannula nightly tonight -if tolerates well will plan to transfer out of ICU tomorrow and continue HFNC nightly  COPD with chronic hypoxic respiratory failure as needed home oxygen at 2 L/min  Acute onChronic diastolic CHF EF 09% on TTE 2016 - appears euvolemic at present - monitor ins and outs  Mild hypernatremia Hold on further diuresis for now -encourage free water intake -recheck in a.m.  CAD s/p CABG Continue beta-blocker, statin, and Zetia - asymptomatic presently  Chronic atrial fibrillation with RVR CHA2DS2-VASc is 3 -chronic Eliquis transition to full dose Lovenox for now -chronic beta-blocker continues  Acute kidney injury on mild CKD baseline creatinine 1.2 -creatinine increased slightly today -encourage oral intake and follow  Recent Labs  Lab 01/29/19 0440 01/30/19 0420 01/31/19 0500 02/01/19 0515 02/02/19 0550  CREATININE 1.25* 1.22 0.95 1.25* 1.49*    Hypertension Blood pressure climbing -adjust medical therapy and follow  Dyslipidemia Continue statin and Zetia  Recent left BKA L BKA and right heel ulcers noted at time of presentation to [facility] -continue wound care -continue PT/OT  Hypothyroidism Continue usual Synthroid dose -TSH stable  DM2 CBG trending low with a significant episode of hypoglycemia last night -adjust treatment  and follow  DVT prophylaxis: Full dose Lovenox Code Status: FULL CODE Family Communication: spoke with daughter via phone at length  Disposition Plan: ICU  Consultants:  PCCM  Antimicrobials:  10/15 cefepime > 10/17 10/15 flagyl > 10/16 10/15 zosyn  10/15 vancomycin  Objective: Blood pressure (!) 142/71, pulse (!) 109, temperature 99 F (37.2 C), temperature source Oral, resp. rate 19, height 5\' 6"  (1.676 m), weight 80 kg, SpO2 100 %.   Intake/Output Summary (Last 24 hours) at 02/02/2019 0819 Last data filed at 02/02/2019 0700 Gross per 24 hour  Intake 506.43 ml  Output 850 ml  Net -343.57 ml   Filed Weights   01/26/2019 1413  Weight: 80 kg    Examination: General: No acute respiratory distress -alert and conversant Lungs: Distant breath sounds throughout -no wheezing Cardiovascular: Regular rate without murmur or rub  Abdomen: Overweight, NT/ND, soft, BS positive Extremities: No significant edema bilateral lower extremities  CBC: Recent Labs  Lab 01/29/19 0440 01/30/19 0420 01/31/19 0500  01/31/19 1406 02/01/19 0515 02/02/19 0550  WBC 14.1* 8.8 12.6*  --   --  7.7 11.8*  NEUTROABS 10.8* 7.3 10.3*  --   --   --   --   HGB 14.2 13.9 14.5   < > 12.2* 13.8 14.0  HCT 46.4 44.6 45.5   < > 36.0* 43.5 45.6  MCV 90.6 89.2 87.8  --   --  87.7 91.6  PLT 216 210 228  --   --  193 218   < > = values in this interval not displayed.   Basic Metabolic Panel: Recent Labs  Lab 02/06/2019 0529  01/29/19 0440 01/30/19 0420 01/31/19 0500  01/31/19 1406 02/01/19 0515 02/02/19 0550  NA 144   < > 143 144 145   < > 142 149* 151*  K 3.7   < > 3.5 3.8 3.7   < > 3.3* 3.6 3.6  CL 106   < > 103 103 105  --   --  110 114*  CO2 28   < > 31 30 29   --   --  25 27  GLUCOSE 90   < > 151* 239* 113*  --   --  227* 87  BUN 54*   < > 48* 42* 40*  --   --  42* 48*  CREATININE 1.50*   < > 1.25* 1.22 0.95  --   --  1.25* 1.49*  CALCIUM 8.9   < > 9.4 8.9 9.1  --   --  8.5* 8.8*  MG 2.2   < > 2.4 2.2 2.3  --   --   --   --   PHOS 3.4  --   --   --   --   --   --   --   --    < > = values in this interval not displayed.   GFR: Estimated Creatinine Clearance: 36 mL/min (A) (by C-G formula based on SCr of 1.49 mg/dL (H)).  Liver Function Tests: Recent Labs  Lab 01/30/19 0420 01/31/19 0500 02/01/19 0515 02/02/19 0550  AST 13* 17 19 13*  ALT 16 18 16 15   ALKPHOS 54 55 50 51  BILITOT 0.6 0.9 0.9 0.9  PROT 5.9* 6.3* 5.2* 5.3*   ALBUMIN 2.6* 2.9* 2.4* 2.5*    HbA1C: Hemoglobin A1C  Date/Time Value Ref Range Status  04/23/2014 04:16 AM 12.1 (H) 4.2 - 6.3 % Final    Comment:    The  American Diabetes Association recommends that a primary goal of therapy should be <7% and that physicians should reevaluate the treatment regimen in patients with HbA1c values consistently >8%.    Hgb A1c MFr Bld  Date/Time Value Ref Range Status  01/26/2019 03:58 AM 6.7 (H) 4.8 - 5.6 % Final    Comment:    (NOTE) Pre diabetes:          5.7%-6.4% Diabetes:              >6.4% Glycemic control for   <7.0% adults with diabetes   09/11/2018 03:05 AM 7.4 (H) 4.8 - 5.6 % Final    Comment:    (NOTE) Pre diabetes:          5.7%-6.4% Diabetes:              >6.4% Glycemic control for   <7.0% adults with diabetes     CBG: Recent Labs  Lab 02/01/19 1603 02/01/19 1947 02/01/19 2321 02/01/19 2350 02/02/19 0327  GLUCAP 122* 95 46* 173* 82    Recent Results (from the past 240 hour(s))  Blood Culture (routine x 2)     Status: None   Collection Time: 01/25/19  2:08 PM   Specimen: BLOOD  Result Value Ref Range Status   Specimen Description BLOOD BLOOD LEFT HAND  Final   Special Requests   Final    BOTTLES DRAWN AEROBIC AND ANAEROBIC Blood Culture adequate volume   Culture   Final    NO GROWTH 5 DAYS Performed at [facility], [address] [place], [place] [address]    Report Status 01/30/2019 FINAL  Final  Blood Culture (routine x 2)     Status: None   Collection Time: 01/25/19  2:09 PM   Specimen: BLOOD  Result Value Ref Range Status   Specimen Description BLOOD BLOOD LEFT FOREARM  Final   Special Requests   Final    BOTTLES DRAWN AEROBIC AND ANAEROBIC Blood Culture adequate volume   Culture   Final    NO GROWTH 5 DAYS Performed at [facility], [address] [place], [place] [address]    Report Status 01/30/2019 FINAL  Final  SARS CORONAVIRUS 2 (TAT 6-24 HRS) Nasopharyngeal Nasopharyngeal  Swab     Status: Abnormal   Collection Time: 01/25/19  2:09 PM   Specimen: Nasopharyngeal Swab  Result Value Ref Range Status   SARS Coronavirus 2 POSITIVE (A) NEGATIVE Final    Comment: RESULT CALLED TO, READ BACK BY AND VERIFIED WITH: RN B MILLER @ 0149 01/26/19 BY S GEZAHEGN (NOTE) SARS-CoV-2 target nucleic acids are DETECTED. The SARS-CoV-2 RNA is generally detectable in upper and lower respiratory specimens during the acute phase of infection. Positive results are indicative of active infection with SARS-CoV-2. Clinical  correlation with patient history and other diagnostic information is necessary to determine patient infection status. Positive results do  not rule out bacterial infection or co-infection with other viruses. The expected result is Negative. Fact Sheet for Patients: SugarRoll.be Fact Sheet for Healthcare Providers: https://www.woods-mathews.com/ This test is not yet approved or cleared by the [place] FDA and  has been authorized for detection and/or diagnosis of SARS-CoV-2 by FDA under an Emergency Use Authorization (EUA). This EUA will remain  in effect (meaning this test can be used)  for the duration of the COVID-19 declaration under Section 564(b)(1) of the Act, 21 U.S.C. section 360bbb-3(b)(1), unless the authorization is terminated or revoked sooner. Performed at [facility], [address] [place], [place] [address]   Urine culture     Status: None   Collection Time: 01/25/19  4:16 PM   Specimen: In/Out Cath Urine  Result Value Ref Range Status   Specimen Description   Final    IN/OUT CATH URINE Performed at [facility], [address] [place], [place] [address]    Special Requests   Final    NONE Performed at [facility], [address], [place], [place] [address]    Culture   Final    NO GROWTH Performed at [facility], [place] [address], [place], [place]  [address]    Report Status 01/26/2019 FINAL  Final  MRSA PCR Screening     Status: None   Collection Time: 01/26/19  3:52 AM   Specimen: Nasopharyngeal  Result Value Ref Range Status   MRSA by PCR NEGATIVE NEGATIVE Final    Comment:        The GeneXpert MRSA Assay (FDA approved for NASAL specimens only), is one component of a comprehensive MRSA colonization surveillance program. It is not intended to diagnose MRSA infection nor to guide or monitor treatment for MRSA infections. Performed at [facility], [address] [place], [place] [address]      Scheduled Meds: . Chlorhexidine Gluconate Cloth  6 each Topical Daily  . citalopram  10 mg Per Tube Daily  . collagenase   Topical Daily  . docusate  100 mg Oral BID  . dorzolamide  1 drop Both Eyes TID  . enoxaparin (LOVENOX) injection  80 mg Subcutaneous Q12H  . ezetimibe  10 mg Per Tube Daily  . finasteride  5 mg Oral Daily  . insulin aspart  0-15 Units Subcutaneous Q4H  . insulin glargine  30 Units Subcutaneous BID  . latanoprost  1 drop Both Eyes QHS  . levothyroxine  50 mcg Per Tube QAC breakfast  . methylPREDNISolone (SOLU-MEDROL) injection  30 mg Intravenous Q12H  . montelukast  10 mg Per Tube QHS  . pantoprazole sodium  40 mg Per Tube Daily  . potassium chloride  40 mEq Per Tube Daily  . sodium chloride flush  3 mL Intravenous Q12H  . tamsulosin  0.4 mg Oral QPC supper  . traZODone  100 mg Oral QHS  . vitamin C  250 mg Per Tube BID     LOS: 6 days   Cherene Altes, MD [facility] Office  [phone] Pager - Text Page per Amion  If 7PM-7AM, please contact night-coverage per Amion 02/02/2019, 8:18 AM

## 2019-02-02 NOTE — Progress Notes (Signed)
Hypoglycemic Event  CBG: 46  Treatment: 25g Dextrose 50% IV   Symptoms: tachycardia  Follow-up CBG: Time:2350 CBG Result:173  Possible Reasons for Event:currently NPO waiting for swallow eval  Comments/MD notified:per charge nurse use standing orders  will notify provider in the morning.   Jekhi Bolin C Ludmilla Mcgillis

## 2019-02-03 ENCOUNTER — Inpatient Hospital Stay (HOSPITAL_COMMUNITY): Payer: Medicare Other

## 2019-02-03 DIAGNOSIS — U071 COVID-19: Secondary | ICD-10-CM | POA: Diagnosis not present

## 2019-02-03 DIAGNOSIS — J9601 Acute respiratory failure with hypoxia: Secondary | ICD-10-CM | POA: Diagnosis not present

## 2019-02-03 LAB — POCT I-STAT 7, (LYTES, BLD GAS, ICA,H+H)
Acid-Base Excess: 2 mmol/L (ref 0.0–2.0)
Bicarbonate: 28.5 mmol/L — ABNORMAL HIGH (ref 20.0–28.0)
Calcium, Ion: 1.32 mmol/L (ref 1.15–1.40)
HCT: 41 % (ref 39.0–52.0)
Hemoglobin: 13.9 g/dL (ref 13.0–17.0)
O2 Saturation: 99 %
Patient temperature: 36.4
Potassium: 3.8 mmol/L (ref 3.5–5.1)
Sodium: 142 mmol/L (ref 135–145)
TCO2: 30 mmol/L (ref 22–32)
pCO2 arterial: 49 mmHg — ABNORMAL HIGH (ref 32.0–48.0)
pH, Arterial: 7.37 (ref 7.350–7.450)
pO2, Arterial: 127 mmHg — ABNORMAL HIGH (ref 83.0–108.0)

## 2019-02-03 LAB — COMPREHENSIVE METABOLIC PANEL
ALT: 15 U/L (ref 0–44)
AST: 13 U/L — ABNORMAL LOW (ref 15–41)
Albumin: 2.7 g/dL — ABNORMAL LOW (ref 3.5–5.0)
Alkaline Phosphatase: 54 U/L (ref 38–126)
Anion gap: 9 (ref 5–15)
BUN: 48 mg/dL — ABNORMAL HIGH (ref 8–23)
CO2: 27 mmol/L (ref 22–32)
Calcium: 8.5 mg/dL — ABNORMAL LOW (ref 8.9–10.3)
Chloride: 106 mmol/L (ref 98–111)
Creatinine, Ser: 1.22 mg/dL (ref 0.61–1.24)
GFR calc Af Amer: >60 mL/min (ref >60)
GFR calc non Af Amer: 54 mL/min — ABNORMAL LOW (ref >60)
Glucose, Bld: 256 mg/dL — ABNORMAL HIGH (ref 70–99)
Potassium: 4.3 mmol/L (ref 3.5–5.1)
Sodium: 142 mmol/L (ref 135–145)
Total Bilirubin: 1.1 mg/dL (ref 0.3–1.2)
Total Protein: 5.3 g/dL — ABNORMAL LOW (ref 6.5–8.1)

## 2019-02-03 LAB — CBC
HCT: 44.2 % (ref 39.0–52.0)
Hemoglobin: 13.4 g/dL (ref 13.0–17.0)
MCH: 27.9 pg (ref 26.0–34.0)
MCHC: 30.3 g/dL (ref 30.0–36.0)
MCV: 92.1 fL (ref 80.0–100.0)
Platelets: 197 10*3/uL (ref 150–400)
RBC: 4.8 MIL/uL (ref 4.22–5.81)
RDW: 16.2 % — ABNORMAL HIGH (ref 11.5–15.5)
WBC: 10.8 10*3/uL — ABNORMAL HIGH (ref 4.0–10.5)
nRBC: 0 % (ref 0.0–0.2)

## 2019-02-03 LAB — GLUCOSE, CAPILLARY
Glucose-Capillary: 221 mg/dL — ABNORMAL HIGH (ref 70–99)
Glucose-Capillary: 170 mg/dL — ABNORMAL HIGH (ref 70–99)
Glucose-Capillary: 163 mg/dL — ABNORMAL HIGH (ref 70–99)
Glucose-Capillary: 135 mg/dL — ABNORMAL HIGH (ref 70–99)

## 2019-02-03 MED ORDER — LORAZEPAM 2 MG/ML IJ SOLN: 0.5000 mg | Freq: Once | INTRAMUSCULAR | Status: DC

## 2019-02-03 MED ORDER — IPRATROPIUM-ALBUTEROL 0.5-2.5 (3) MG/3ML IN SOLN
3.0000 mL | RESPIRATORY_TRACT | Status: DC
  Administered 2019-02-03 – 2019-02-04 (×4): 3 mL via RESPIRATORY_TRACT
  Filled 2019-02-03 (×4): qty 3

## 2019-02-03 MED ORDER — LORAZEPAM 2 MG/ML IJ SOLN
0.5000 mg | INTRAMUSCULAR | Status: DC | PRN
  Administered 2019-02-05: 11:00:00 1 mg via INTRAVENOUS
  Filled 2019-02-03: qty 1

## 2019-02-03 MED ORDER — METHYLPREDNISOLONE SODIUM SUCC 125 MG IJ SOLR
60.0000 mg | Freq: Two times a day (BID) | INTRAMUSCULAR | Status: DC
  Administered 2019-02-04 – 2019-02-05 (×3): 60 mg via INTRAVENOUS
  Filled 2019-02-03 (×3): qty 2

## 2019-02-03 MED ORDER — METOPROLOL TARTRATE 5 MG/5ML IV SOLN: 10.0000 mg | Freq: Once | INTRAVENOUS | Status: DC

## 2019-02-03 MED ORDER — FUROSEMIDE 10 MG/ML IJ SOLN
60.0000 mg | Freq: Once | INTRAMUSCULAR | Status: AC
  Administered 2019-02-03: 19:00:00 60 mg via INTRAVENOUS
  Filled 2019-02-03: qty 6

## 2019-02-03 MED ORDER — ALBUTEROL SULFATE (2.5 MG/3ML) 0.083% IN NEBU: 2.5000 mg | INHALATION_SOLUTION | RESPIRATORY_TRACT | Status: DC | PRN

## 2019-02-03 MED ORDER — METHYLPREDNISOLONE SODIUM SUCC 125 MG IJ SOLR
60.0000 mg | Freq: Once | INTRAMUSCULAR | Status: AC
  Administered 2019-02-03: 20:00:00 60 mg via INTRAVENOUS
  Filled 2019-02-03: qty 2

## 2019-02-03 MED ORDER — PHENYLEPHRINE HCL-NACL 10-0.9 MG/250ML-% IV SOLN
0.0000 ug/min | INTRAVENOUS | Status: DC
  Administered 2019-02-03: 20 ug/min via INTRAVENOUS
  Filled 2019-02-03: qty 250

## 2019-02-03 MED ORDER — PREDNISONE 20 MG PO TABS
20.0000 mg | ORAL_TABLET | Freq: Every day | ORAL | Status: DC
  Filled 2019-02-03: qty 1

## 2019-02-03 MED ORDER — METOPROLOL TARTRATE 5 MG/5ML IV SOLN
10.0000 mg | Freq: Four times a day (QID) | INTRAVENOUS | Status: DC
  Administered 2019-02-04: 13:00:00 5 mg via INTRAVENOUS

## 2019-02-03 MED ORDER — METOPROLOL TARTRATE 5 MG/5ML IV SOLN
5.0000 mg | Freq: Once | INTRAVENOUS | Status: AC
  Administered 2019-02-03: 18:00:00 5 mg via INTRAVENOUS
  Filled 2019-02-03: qty 5

## 2019-02-03 MED ORDER — BUDESONIDE 0.5 MG/2ML IN SUSP
0.5000 mg | Freq: Two times a day (BID) | RESPIRATORY_TRACT | Status: DC
  Administered 2019-02-03 – 2019-02-04 (×2): 0.5 mg via RESPIRATORY_TRACT
  Filled 2019-02-03 (×2): qty 2

## 2019-02-03 MED ORDER — INSULIN ASPART 100 UNIT/ML ~~LOC~~ SOLN
4.0000 [IU] | Freq: Three times a day (TID) | SUBCUTANEOUS | Status: DC
  Administered 2019-02-03 (×2): 4 [IU] via SUBCUTANEOUS

## 2019-02-03 NOTE — Progress Notes (Signed)
NAME:  Shawn No., MRN:  [id number], DOB:  Dec 26, 1931, LOS: 7 ADMISSION DATE:  02/06/2019, CONSULTATION DATE:  10/21 REFERRING MD:  [facility], CHIEF COMPLAINT:  Dyspnea   Brief History   85 year old male admitted on October 17 for Covid pneumonia, treated with steroids and remdesivir, moved to the intensive care unit on October 21 in the setting of worsening respiratory failure without significant hypoxemia.  Past Medical History  Neuropathy History of nephrolithiasis Hypertension Hyperlipidemia GERD Diabetes mellitus type 2 History of stroke in 2016 and 1999 COPD on as needed oxygen at home Chronic diastolic heart failure, 8921 TTE> LVEF 65% on RV normal, valves OK Chronic atrial fibrillation Coronary artery disease  Significant Hospital Events   10/17 admission, steroids, remdesivir 10/18-20, several days of intermittent lethargy and dyspnea 10/21 dyspnea, worsenign lethargy moved to ICU for intubation 10/22 extubation, BIPAP overnight 10/23 more awake and alert after BIPAP, transitioned to nightly heated high flow to minimize aerosolization risk 10/23 awake and alert, not dyspneic  Consults:  PCCM  Procedures:  10/17 ETT > 10/22  Significant Diagnostic Tests:    Micro Data:  10/15 SARS COV 2 > positive 10/16 blood >  10/15 urine >   Antimicrobials/COVID Rx:  10/15 cefepime > 10/17 10/15 flagyl > 10/16 10/15 zosyn  10/16 remdesivir >  10/15 vancomycin x 1   Interim history/subjective:   Slept well on heated high flow overnight Feels better Wants to get out of the hospital  Objective   Blood pressure 106/68, pulse 92, temperature (!) 97.5 F (36.4 C), temperature source Oral, resp. rate (!) 25, height 5\' 6"  (1.676 m), weight 80 kg, SpO2 100 %.    FiO2 (%):  [30 %-40 %] 30 %   Intake/Output Summary (Last 24 hours) at 02/03/2019 0758 Last data filed at 02/03/2019 0500 Gross per 24 hour  Intake 131.71 ml  Output 900 ml  Net -768.29 ml    Filed Weights   01/20/2019 1413  Weight: 80 kg    Examination:  General:  Chronically ill appearing but resting comfortably in bed,awake and alert HENT: NCAT OP clear PULM: Crackles bases, otherwise clear, normal effort CV: RRR, no mgr GI: BS+, soft, nontender MSK: normal bulk and tone, s/p L AKA Neuro: awake, alert, no distress, [facility] Problem list     Assessment & Plan:  Acute respiratory failure due to increase work of breathing 10/21, improved Most of the respiratory distress and confusion in the daytime in the last week was coming from untreated sleep disordered breathing Continue heated high flow at night in lieu of home BIPAP Resume home BIPAP after leaving hospital  COPD baseline, mild exacerbation? combivent QID Wean off solumedrol, prednisone 20mg  daily x 3 days  Best practice:  Diet: tube feeding Pain/Anxiety/Delirium protocol (if indicated): n/a VAP protocol (if indicated): n/a DVT prophylaxis: full dose lovenox GI prophylaxis: pantoprazole daily Glucose control: SSI Mobility: bed rest Code Status: FULL Family Communication: per [facility] Disposition: remain in ICU  Labs   CBC: Recent Labs  Lab 01/28/19 0440 01/29/19 0440 01/30/19 0420 01/31/19 0500 01/31/19 1253 01/31/19 1406 02/01/19 0515 02/02/19 0550 02/03/19 0445  WBC 5.3 14.1* 8.8 12.6*  --   --  7.7 11.8* 10.8*  NEUTROABS 4.1 10.8* 7.3 10.3*  --   --   --   --   --   HGB 12.7* 14.2 13.9 14.5 15.0 12.2* 13.8 14.0 13.4  HCT 40.9 46.4 44.6 45.5 44.0 36.0* 43.5 45.6 44.2  MCV 89.5 90.6 89.2 87.8  --   --  87.7 91.6 92.1  PLT 165 216 210 228  --   --  193 218 846    Basic Metabolic Panel: Recent Labs  Lab 01/28/19 0440 01/29/19 0440 01/30/19 0420 01/31/19 0500 01/31/19 1253 01/31/19 1406 02/01/19 0515 02/02/19 0550 02/03/19 0445  NA 140 143 144 145 141 142 149* 151* 142  K 3.4* 3.5 3.8 3.7 3.4* 3.3* 3.6 3.6 4.3  CL 103 103 103 105  --   --  110 114* 106  CO2 28 31  30 29   --   --  25 27 27   GLUCOSE 244* 151* 239* 113*  --   --  227* 87 256*  BUN 50* 48* 42* 40*  --   --  42* 48* 48*  CREATININE 1.40* 1.25* 1.22 0.95  --   --  1.25* 1.49* 1.22  CALCIUM 8.9 9.4 8.9 9.1  --   --  8.5* 8.8* 8.5*  MG 2.2 2.4 2.2 2.3  --   --   --   --   --    GFR: Estimated Creatinine Clearance: 44 mL/min (by C-G formula based on SCr of 1.22 mg/dL). Recent Labs  Lab 01/29/19 0440 01/30/19 0420 01/31/19 0500 02/01/19 0515 02/02/19 0550 02/03/19 0445  PROCALCITON <0.10 <0.10 <0.10  --   --   --   WBC 14.1* 8.8 12.6* 7.7 11.8* 10.8*    Liver Function Tests: Recent Labs  Lab 01/30/19 0420 01/31/19 0500 02/01/19 0515 02/02/19 0550 02/03/19 0445  AST 13* 17 19 13* 13*  ALT 16 18 16 15 15   ALKPHOS 54 55 50 51 54  BILITOT 0.6 0.9 0.9 0.9 1.1  PROT 5.9* 6.3* 5.2* 5.3* 5.3*  ALBUMIN 2.6* 2.9* 2.4* 2.5* 2.7*   No results for input(s): LIPASE, AMYLASE in the last 168 hours. No results for input(s): AMMONIA in the last 168 hours.  ABG    Component Value Date/Time   PHART 7.471 (H) 01/31/2019 1406   PCO2ART 44.3 01/31/2019 1406   PO2ART 526.0 (H) 01/31/2019 1406   HCO3 32.4 (H) 01/31/2019 1406   TCO2 34 (H) 01/31/2019 1406   ACIDBASEDEF 1.1 04/17/2018 1314   O2SAT 100.0 01/31/2019 1406     Coagulation Profile: No results for input(s): INR, PROTIME in the last 168 hours.  Cardiac Enzymes: No results for input(s): CKTOTAL, CKMB, CKMBINDEX, TROPONINI in the last 168 hours.  HbA1C: Hemoglobin A1C  Date/Time Value Ref Range Status  04/23/2014 04:16 AM 12.1 (H) 4.2 - 6.3 % Final    Comment:    The American Diabetes Association recommends that a primary goal of therapy should be <7% and that physicians should reevaluate the treatment regimen in patients with HbA1c values consistently >8%.    Hgb A1c MFr Bld  Date/Time Value Ref Range Status  01/26/2019 03:58 AM 6.7 (H) 4.8 - 5.6 % Final    Comment:    (NOTE) Pre diabetes:          5.7%-6.4%  Diabetes:              >6.4% Glycemic control for   <7.0% adults with diabetes   09/11/2018 03:05 AM 7.4 (H) 4.8 - 5.6 % Final    Comment:    (NOTE) Pre diabetes:          5.7%-6.4% Diabetes:              >6.4% Glycemic control for   <7.0% adults with diabetes  CBG: Recent Labs  Lab 02/02/19 0327 02/02/19 0839 02/02/19 1314 02/02/19 1648 02/02/19 2122  GLUCAP 82 133* 82 203* 269*     Critical care time: n/a     Roselie Awkward, MD [place] [facility] Pager: [phone] Cell: [phone] If no response, call [phone]

## 2019-02-03 NOTE — Progress Notes (Signed)
Called [facility] and spoke with Elzie Rings RN regarding the pt's level of consciousness and hypotension. Will hold IV Ativan that is ordered because of the pt's lack of responsiveness. Planned ABG for 2030 and waiting for results.

## 2019-02-03 NOTE — Progress Notes (Addendum)
[name].  IWP:[id number] DOB: 06/11/1931 DOA: 02/01/2019 PCP: Jearld Fenton, NP    Brief Narrative:  85 year old with a history of CAD, chronic atrial fibrillation on Eliquis, chronic diastolic CHF, COPD, CVA x2, and DM 2, PAD status post BKA June 2020, and obstructive sleep apnea on nightly CPAP who presented to the ED with fever and was initially admitted to [facility] with a diagnosis of pneumonia.  When his Covid test returned positive he was transferred to [facility].  On 1021 he experienced an acute decompensation in which he exhibited marked increased work of breathing with tachypnea and altered mental status.  He was transferred to the ICU and intubated.  Significant Events: 10/15 admit to [facility] 10/17 transfer to [facility] 10/21 transfer to ICU - intubated 10/22 extubated - trial of nightly BIPAP 10/23 trial of nightly HHFNC as BIPAP substitute 10/24 transfer to PCU    COVID-19 specific Treatment: Remdesivir 10/16 > 10/20 Decadron 10/16 Solu-Medrol 10/17 >  Subjective: The patient is awake alert and conversant.  His respirations are comfortable.  He tolerated heated high flow nasal cannula last night in place of BiPAP without difficulty.  He denies chest pain shortness of breath nausea or vomiting.  He tells me he is highly motivated to begin therapy/improve his mobility.  Assessment & Plan:  Acute on chronic hypoxic respiratory failure - Covid pneumonia Exact etiology of the acute decline 10/21 unclear - much improved/stabilized at this time -tolerated BiPAP the first night after his extubation and heated high flow nasal cannula as a substitute last night - has completed 5-day course of remdesivir - will complete a 10-day course of steroid therapy -no evidence of worsening viral pneumonitis  Recent Labs  Lab 01/29/19 0440 01/30/19 0420 01/31/19 0500 02/01/19 0515 02/02/19 0550 02/03/19 0445  DDIMER 0.62* 0.50 0.61* 0.66* 0.65*  --   FERRITIN  --   --   --    --  148  --   CRP 0.9 2.5* 1.3* <0.8 <0.8  --   ALT 16 16 18 16 15 15   PROCALCITON <0.10 <0.10 <0.10  --   --   --     BiPAP dependent obstructive sleep apnea Perhaps his acute decline was related to inability to use BiPAP at [facility] each night, though a lack of severe hypercarbia argues against this - tolerated BiPAP the first night after his extubation, and HHFNC last night - continue HHFNC only at night on PCU as a substitute for his usual home BIPAP   COPD with chronic hypoxic respiratory failure Reportedly requires home oxygen prn at 2 L/min - wean O2 during day as able   Acute onChronic diastolic CHF EF 50% on TTE 2016 - appears euvolemic on exam - monitor ins and outs -is not on chronic diuretic therapy -net negative approximately 6 L since admission  Mild hypernatremia Resolved at this time -follow intermittently  CAD s/p CABG Continue beta-blocker, statin, and Zetia - asymptomatic   Chronic atrial fibrillation with RVR CHA2DS2-VASc is 3 - chronic beta-blocker continues -transition back to Eliquis today  Acute kidney injury on mild CKD baseline creatinine 1.2 - creatinine has improved to his baseline  Recent Labs  Lab 01/30/19 0420 01/31/19 0500 02/01/19 0515 02/02/19 0550 02/03/19 0445  CREATININE 1.22 0.95 1.25* 1.49* 1.22    Hypertension Blood pressure well controlled today with borderline hypotension -monitor without change at this time  Dyslipidemia Continue statin and Zetia  Recent left BKA L BKA and right  heel ulcers noted at time of presentation to [facility] - continue wound care - continue PT/OT - may require a rehab stay but is highly motivated to go home  Hypothyroidism Continue usual Synthroid dose -TSH stable  DM2 With significant modification in treatment yesterday related to hypoglycemia patient is now trending back upward -titrate medical therapy again and follow  DVT prophylaxis: Full dose Lovenox Code Status: FULL CODE Family  Communication: spoke with daughter via phone at length  Disposition Plan: Transfer to PCU - [place] at night as a BiPAP substitute -PT/OT/ambulate  Consultants:  PCCM  Antimicrobials:  10/15 cefepime > 10/17 10/15 flagyl > 10/16 10/15 zosyn  10/15 vancomycin  Objective: Blood pressure 106/68, pulse 92, temperature (!) 97.5 F (36.4 C), temperature source Oral, resp. rate (!) 25, height 5\' 6"  (1.676 m), weight 80 kg, SpO2 100 %.  Intake/Output Summary (Last 24 hours) at 02/03/2019 0807 Last data filed at 02/03/2019 0500 Gross per 24 hour  Intake 131.71 ml  Output 900 ml  Net -768.29 ml   Filed Weights   01/30/2019 1413  Weight: 80 kg    Examination: General: Alert and conversant with no complaints Lungs: Improved air movement throughout all fields with no focal crackles or wheezing Cardiovascular: Irregularly irregular with controlled rate without murmur or rub Abdomen: Overweight, NT/ND, soft, BS positive sign Extremities: No significant cyanosis clubbing edema bilateral lower extremities -left BKA wound dressed and dry with no significant erythema  CBC: Recent Labs  Lab 01/29/19 0440 01/30/19 0420 01/31/19 0500  02/01/19 0515 02/02/19 0550 02/03/19 0445  WBC 14.1* 8.8 12.6*  --  7.7 11.8* 10.8*  NEUTROABS 10.8* 7.3 10.3*  --   --   --   --   HGB 14.2 13.9 14.5   < > 13.8 14.0 13.4  HCT 46.4 44.6 45.5   < > 43.5 45.6 44.2  MCV 90.6 89.2 87.8  --  87.7 91.6 92.1  PLT 216 210 228  --  193 218 197   < > = values in this interval not displayed.   Basic Metabolic Panel: Recent Labs  Lab 01/29/19 0440 01/30/19 0420 01/31/19 0500  02/01/19 0515 02/02/19 0550 02/03/19 0445  NA 143 144 145   < > 149* 151* 142  K 3.5 3.8 3.7   < > 3.6 3.6 4.3  CL 103 103 105  --  110 114* 106  CO2 31 30 29   --  25 27 27   GLUCOSE 151* 239* 113*  --  227* 87 256*  BUN 48* 42* 40*  --  42* 48* 48*  CREATININE 1.25* 1.22 0.95  --  1.25* 1.49* 1.22  CALCIUM 9.4 8.9 9.1  --  8.5* 8.8*  8.5*  MG 2.4 2.2 2.3  --   --   --   --    < > = values in this interval not displayed.   GFR: Estimated Creatinine Clearance: 44 mL/min (by C-G formula based on SCr of 1.22 mg/dL).  Liver Function Tests: Recent Labs  Lab 01/31/19 0500 02/01/19 0515 02/02/19 0550 02/03/19 0445  AST 17 19 13* 13*  ALT 18 16 15 15   ALKPHOS 55 50 51 54  BILITOT 0.9 0.9 0.9 1.1  PROT 6.3* 5.2* 5.3* 5.3*  ALBUMIN 2.9* 2.4* 2.5* 2.7*    HbA1C: Hemoglobin A1C  Date/Time Value Ref Range Status  04/23/2014 04:16 AM 12.1 (H) 4.2 - 6.3 % Final    Comment:    The American Diabetes Association recommends that a  primary goal of therapy should be <7% and that physicians should reevaluate the treatment regimen in patients with HbA1c values consistently >8%.    Hgb A1c MFr Bld  Date/Time Value Ref Range Status  01/26/2019 03:58 AM 6.7 (H) 4.8 - 5.6 % Final    Comment:    (NOTE) Pre diabetes:          5.7%-6.4% Diabetes:              >6.4% Glycemic control for   <7.0% adults with diabetes   09/11/2018 03:05 AM 7.4 (H) 4.8 - 5.6 % Final    Comment:    (NOTE) Pre diabetes:          5.7%-6.4% Diabetes:              >6.4% Glycemic control for   <7.0% adults with diabetes     CBG: Recent Labs  Lab 02/02/19 0839 02/02/19 1314 02/02/19 1648 02/02/19 2122 02/03/19 0746  GLUCAP 133* 82 203* 269* 221*    Recent Results (from the past 240 hour(s))  Blood Culture (routine x 2)     Status: None   Collection Time: 01/25/19  2:08 PM   Specimen: BLOOD  Result Value Ref Range Status   Specimen Description BLOOD BLOOD LEFT HAND  Final   Special Requests   Final    BOTTLES DRAWN AEROBIC AND ANAEROBIC Blood Culture adequate volume   Culture   Final    NO GROWTH 5 DAYS Performed at [facility], [address] [place], [place] [address]    Report Status 01/30/2019 FINAL  Final  Blood Culture (routine x 2)     Status: None   Collection Time: 01/25/19  2:09 PM   Specimen: BLOOD   Result Value Ref Range Status   Specimen Description BLOOD BLOOD LEFT FOREARM  Final   Special Requests   Final    BOTTLES DRAWN AEROBIC AND ANAEROBIC Blood Culture adequate volume   Culture   Final    NO GROWTH 5 DAYS Performed at [facility], [address] [place], [place] [address]    Report Status 01/30/2019 FINAL  Final  SARS CORONAVIRUS 2 (TAT 6-24 HRS) Nasopharyngeal Nasopharyngeal Swab     Status: Abnormal   Collection Time: 01/25/19  2:09 PM   Specimen: Nasopharyngeal Swab  Result Value Ref Range Status   SARS Coronavirus 2 POSITIVE (A) NEGATIVE Final    Comment: RESULT CALLED TO, READ BACK BY AND VERIFIED WITH: RN B MILLER @ 0149 01/26/19 BY S GEZAHEGN (NOTE) SARS-CoV-2 target nucleic acids are DETECTED. The SARS-CoV-2 RNA is generally detectable in upper and lower respiratory specimens during the acute phase of infection. Positive results are indicative of active infection with SARS-CoV-2. Clinical  correlation with patient history and other diagnostic information is necessary to determine patient infection status. Positive results do  not rule out bacterial infection or co-infection with other viruses. The expected result is Negative. Fact Sheet for Patients: SugarRoll.be Fact Sheet for Healthcare Providers: https://www.woods-mathews.com/ This test is not yet approved or cleared by the [place] FDA and  has been authorized for detection and/or diagnosis of SARS-CoV-2 by FDA under an Emergency Use Authorization (EUA). This EUA will remain  in effect (meaning this test can be used)  for the duration of the COVID-19 declaration under Section 564(b)(1) of the Act, 21 U.S.C. section 360bbb-3(b)(1), unless the authorization is terminated or revoked sooner. Performed at [facility], [place] [address], [place], [place] [address]  Urine culture     Status: None   Collection Time: 01/25/19  4:16 PM   Specimen:  In/Out Cath Urine  Result Value Ref Range Status   Specimen Description   Final    IN/OUT CATH URINE Performed at [facility], [address] [place], [place] [address]    Special Requests   Final    NONE Performed at [facility], [address] [place], [place] [address]    Culture   Final    NO GROWTH Performed at [facility], [place] [address], [place], [place] [address]    Report Status 01/26/2019 FINAL  Final  MRSA PCR Screening     Status: None   Collection Time: 01/26/19  3:52 AM   Specimen: Nasopharyngeal  Result Value Ref Range Status   MRSA by PCR NEGATIVE NEGATIVE Final    Comment:        The GeneXpert MRSA Assay (FDA approved for NASAL specimens only), is one component of a comprehensive MRSA colonization surveillance program. It is not intended to diagnose MRSA infection nor to guide or monitor treatment for MRSA infections. Performed at [facility], [address] [place],  [address]      Scheduled Meds: . apixaban  5 mg Oral BID  . Chlorhexidine Gluconate Cloth  6 each Topical Daily  . citalopram  10 mg Oral Daily  . collagenase   Topical Daily  . docusate sodium  100 mg Oral BID  . dorzolamide  1 drop Both Eyes TID  . ezetimibe  10 mg Oral Daily  . finasteride  5 mg Oral Daily  . fluconazole  100 mg Oral Daily  . insulin aspart  0-5 Units Subcutaneous QHS  . insulin aspart  0-9 Units Subcutaneous TID WC  . latanoprost  1 drop Both Eyes QHS  . levothyroxine  50 mcg Oral QAC breakfast  . methylPREDNISolone (SOLU-MEDROL) injection  30 mg Intravenous Q12H  . metoprolol succinate  50 mg Oral BID  . montelukast  10 mg Oral QHS  . pantoprazole  40 mg Oral Daily  . potassium chloride  40 mEq Oral Daily  . sodium chloride flush  3 mL Intravenous Q12H  . tamsulosin  0.4 mg Oral QPC supper  . traZODone  100 mg Oral QHS  . vitamin C  250 mg Oral BID     LOS: 7 days   Cherene Altes, MD [facility] Office  [phone] Pager - Text Page per Amion  If 7PM-7AM, please contact night-coverage per Amion 02/03/2019, 8:07 AM

## 2019-02-03 NOTE — Plan of Care (Signed)
Pt transferred back to ICU and placed on Bipap. Inspiratory wheezes auscultated. Updated pt's, daughter Butch Penny regarding the plan and pt's condition. Will continue to monitor and will update the daughter if there are further changes.

## 2019-02-03 NOTE — Progress Notes (Signed)
When no new order was given by Warren Lacy MD, Dr. Corinna Lines the RN paged Dr. Loralee Pacas with [facility] regarding the hypotension. Dr. Loralee Pacas ordered to not give Lasix which isn't scheduled at this time. BP remains low but pt is responding appropriately and is calm while on Bipap.

## 2019-02-03 NOTE — Plan of Care (Signed)
Spoke with patients daughter, Butch Penny, and updated on patient status and room change. Encouraged and answered all questions.

## 2019-02-03 NOTE — Progress Notes (Signed)
eLink Physician-Brief Progress Note Patient Name: [name]. DOB: 1932/01/08 MRN: [id number]   Date of Service  02/03/2019  HPI/Events of Note  Hypotension - BP = 85/34 with MAP = 46. No CVL.  eICU Interventions  Will order: 1. Phenylephrine IV infusion. Titrate to MAP > 65.     Intervention Category Major Interventions: Hypotension - evaluation and management  Sommer,Steven Eugene 02/03/2019, 10:45 PM

## 2019-02-04 ENCOUNTER — Inpatient Hospital Stay (HOSPITAL_COMMUNITY): Payer: Medicare Other

## 2019-02-04 DIAGNOSIS — R0603 Acute respiratory distress: Secondary | ICD-10-CM

## 2019-02-04 DIAGNOSIS — J9601 Acute respiratory failure with hypoxia: Secondary | ICD-10-CM | POA: Diagnosis not present

## 2019-02-04 DIAGNOSIS — U071 COVID-19: Secondary | ICD-10-CM | POA: Diagnosis not present

## 2019-02-04 LAB — COMPREHENSIVE METABOLIC PANEL
ALT: 16 U/L (ref 0–44)
AST: 10 U/L — ABNORMAL LOW (ref 15–41)
Albumin: 2.5 g/dL — ABNORMAL LOW (ref 3.5–5.0)
Alkaline Phosphatase: 52 U/L (ref 38–126)
Anion gap: 10 (ref 5–15)
BUN: 44 mg/dL — ABNORMAL HIGH (ref 8–23)
CO2: 26 mmol/L (ref 22–32)
Calcium: 8.7 mg/dL — ABNORMAL LOW (ref 8.9–10.3)
Chloride: 109 mmol/L (ref 98–111)
Creatinine, Ser: 1.22 mg/dL (ref 0.61–1.24)
GFR calc Af Amer: >60 mL/min (ref >60)
GFR calc non Af Amer: 54 mL/min — ABNORMAL LOW (ref >60)
Glucose, Bld: 188 mg/dL — ABNORMAL HIGH (ref 70–99)
Potassium: 4.1 mmol/L (ref 3.5–5.1)
Sodium: 145 mmol/L (ref 135–145)
Total Bilirubin: 1.1 mg/dL (ref 0.3–1.2)
Total Protein: 5.2 g/dL — ABNORMAL LOW (ref 6.5–8.1)

## 2019-02-04 LAB — C-REACTIVE PROTEIN: CRP: 2.4 mg/dL — ABNORMAL HIGH (ref <1.0)

## 2019-02-04 LAB — CBC
HCT: 42.6 % (ref 39.0–52.0)
Hemoglobin: 13.5 g/dL (ref 13.0–17.0)
MCH: 28.9 pg (ref 26.0–34.0)
MCHC: 31.7 g/dL (ref 30.0–36.0)
MCV: 91.2 fL (ref 80.0–100.0)
Platelets: 190 10*3/uL (ref 150–400)
RBC: 4.67 MIL/uL (ref 4.22–5.81)
RDW: 16 % — ABNORMAL HIGH (ref 11.5–15.5)
WBC: 10 10*3/uL (ref 4.0–10.5)
nRBC: 0 % (ref 0.0–0.2)

## 2019-02-04 LAB — GLUCOSE, CAPILLARY
Glucose-Capillary: 161 mg/dL — ABNORMAL HIGH (ref 70–99)
Glucose-Capillary: 187 mg/dL — ABNORMAL HIGH (ref 70–99)
Glucose-Capillary: 238 mg/dL — ABNORMAL HIGH (ref 70–99)
Glucose-Capillary: 190 mg/dL — ABNORMAL HIGH (ref 70–99)

## 2019-02-04 LAB — POCT I-STAT 7, (LYTES, BLD GAS, ICA,H+H)
Acid-Base Excess: 2 mmol/L (ref 0.0–2.0)
Bicarbonate: 26.6 mmol/L (ref 20.0–28.0)
Calcium, Ion: 1.28 mmol/L (ref 1.15–1.40)
HCT: 38 % — ABNORMAL LOW (ref 39.0–52.0)
Hemoglobin: 12.9 g/dL — ABNORMAL LOW (ref 13.0–17.0)
O2 Saturation: 96 %
Patient temperature: 98.6
Potassium: 4 mmol/L (ref 3.5–5.1)
Sodium: 143 mmol/L (ref 135–145)
TCO2: 28 mmol/L (ref 22–32)
pCO2 arterial: 40.9 mmHg (ref 32.0–48.0)
pH, Arterial: 7.42 (ref 7.350–7.450)
pO2, Arterial: 80 mmHg — ABNORMAL LOW (ref 83.0–108.0)

## 2019-02-04 LAB — D-DIMER, QUANTITATIVE: D-Dimer, Quant: 0.44 ug/mL-FEU (ref 0.00–0.50)

## 2019-02-04 LAB — FERRITIN: Ferritin: 212 ng/mL (ref 24–336)

## 2019-02-04 LAB — MRSA PCR SCREENING: MRSA by PCR: NEGATIVE

## 2019-02-04 MED ORDER — IPRATROPIUM-ALBUTEROL 20-100 MCG/ACT IN AERS
1.0000 | INHALATION_SPRAY | Freq: Four times a day (QID) | RESPIRATORY_TRACT | Status: DC
  Administered 2019-02-04: 13:00:00 1 via RESPIRATORY_TRACT
  Filled 2019-02-04: qty 4

## 2019-02-04 MED ORDER — METOPROLOL SUCCINATE ER 50 MG PO TB24
50.0000 mg | ORAL_TABLET | Freq: Two times a day (BID) | ORAL | Status: DC
  Administered 2019-02-04 – 2019-02-05 (×3): 50 mg via ORAL
  Filled 2019-02-04 (×6): qty 1

## 2019-02-04 MED ORDER — METOPROLOL TARTRATE 5 MG/5ML IV SOLN: 5.0000 mg | Freq: Four times a day (QID) | INTRAVENOUS | Status: DC

## 2019-02-04 MED ORDER — ALBUTEROL SULFATE HFA 108 (90 BASE) MCG/ACT IN AERS
2.0000 | INHALATION_SPRAY | Freq: Four times a day (QID) | RESPIRATORY_TRACT | Status: DC | PRN
  Administered 2019-02-05: 2 via RESPIRATORY_TRACT
  Filled 2019-02-04: qty 6.7

## 2019-02-04 MED ORDER — IPRATROPIUM-ALBUTEROL 20-100 MCG/ACT IN AERS
1.0000 | INHALATION_SPRAY | Freq: Four times a day (QID) | RESPIRATORY_TRACT | Status: DC
  Administered 2019-02-04 – 2019-02-06 (×6): 1 via RESPIRATORY_TRACT
  Filled 2019-02-04: qty 4

## 2019-02-04 MED ORDER — IPRATROPIUM-ALBUTEROL 0.5-2.5 (3) MG/3ML IN SOLN
3.0000 mL | Freq: Four times a day (QID) | RESPIRATORY_TRACT | Status: DC
  Administered 2019-02-05 – 2019-02-07 (×7): 3 mL via RESPIRATORY_TRACT
  Filled 2019-02-04 (×7): qty 3

## 2019-02-04 NOTE — Progress Notes (Signed)
NAME:  [name]., MRN:  [id number], DOB:  1932/02/26, LOS: 8 ADMISSION DATE:  01/24/2019, CONSULTATION DATE:  10/21 REFERRING MD:  W[facility]ron Labs, CHIEF COMPLAINT:  Dyspnea   Brief History   85 year old male admitted on October 17 for Covid pneumonia, treated with steroids and remdesivir, moved to the intensive care unit on October 21 in the setting of worsening respiratory failure without significant hypoxemia.  Past Medical History  Neuropathy History of nephrolithiasis Hypertension Hyperlipidemia GERD Diabetes mellitus type 2 History of stroke in 2016 and 1999 COPD on as needed oxygen at home Chronic diastolic heart failure, 4970 TTE> LVEF 65% on RV normal, valves OK Chronic atrial fibrillation Coronary artery disease  Significant Hospital Events   10/17 admission, steroids, remdesivir 10/18-20, several days of intermittent lethargy and dyspnea 10/21 dyspnea, worsenign lethargy moved to ICU for intubation 10/22 extubation, BIPAP overnight 10/23 more awake and alert after BIPAP, transitioned to nightly heated high flow to minimize aerosolization risk 10/23 awake and alert, not dyspneic 10/24 moved to PCU, then moved back to ICU that evening for increased work of breathing, wheezing placed on BIPAP with good result  Consults:  PCCM  Procedures:  10/17 ETT > 10/22  Significant Diagnostic Tests:    Micro Data:  10/15 SARS COV 2 > positive 10/16 blood >  10/15 urine >   Antimicrobials/COVID Rx:  10/15 cefepime > 10/17 10/15 flagyl > 10/16 10/15 zosyn  10/16 remdesivir > 10/20 10/15 vancomycin x 1   Interim history/subjective:   Moved back to the ICU last night in setting of increased work of breathing  Objective   Blood pressure 106/72, pulse 91, temperature (!) 97.5 F (36.4 C), temperature source Axillary, resp. rate (!) 23, height 5\' 6"  (1.676 m), weight 80 kg, SpO2 98 %.    Vent Mode: BIPAP FiO2 (%):  [50 %-60 %] 50 % Set Rate:  [15 bmp] 15 bmp  PEEP:  [5 cmH20] 5 cmH20   Intake/Output Summary (Last 24 hours) at 02/04/2019 2637 Last data filed at 02/04/2019 0800 Gross per 24 hour  Intake 491.42 ml  Output 2430 ml  Net -1938.58 ml   Filed Weights   01/24/2019 1413  Weight: 80 kg    Examination:  General:  Resting comfortably in bed HENT: NCAT OP clear PULM: CTA B, normal effort CV: RRR, no mgr GI: BS+, soft, nontender MSK: normal bulk and tone, L AKA Neuro: drowsy but will wake up and answer some questinos, MAEW  10/25 CXR > RUL infiltrate persistent bilateral interstitial infiltrate  Resolved Hospital Problem list     Assessment & Plan:  Acute respiratory failure due to increase work of breathing again 10/24> bronchospasm vs vocal cord dysfunction (favor the latter) as these episodes occur after drinking liquids COVID pneumonia  Doubt CHF as net negative Monitor in ICU Brought on by aspiration? Continue solumedrol Continue BIPAP qHS while in ICU SLP evaluation again> should we use thickened liquids? Continue celexa and prn anxiolytics for VCD management Continue pantoprazole as GERD can make VCD worse Monitor for sinus congestion Continue O2 during daytime to keep O2 saturation > 85%  COPD baseline, mild exacerbation? combivent QID Agree with increased dose of systemic steroid    Best practice:  Diet: tube feeding Pain/Anxiety/Delirium protocol (if indicated): n/a VAP protocol (if indicated): n/a DVT prophylaxis: full dose lovenox GI prophylaxis: pantoprazole daily Glucose control: SSI Mobility: bed rest Code Status: FULL Family Communication: per TRH Disposition: remain in ICU  Labs   CBC: Recent  Labs  Lab 01/29/19 0440 01/30/19 0420 01/31/19 0500  02/01/19 0515 02/02/19 0550 02/03/19 0445 02/03/19 2003 02/04/19 0353 02/04/19 0530  WBC 14.1* 8.8 12.6*  --  7.7 11.8* 10.8*  --   --  10.0  NEUTROABS 10.8* 7.3 10.3*  --   --   --   --   --   --   --   HGB 14.2 13.9 14.5   < > 13.8 14.0  13.4 13.9 12.9* 13.5  HCT 46.4 44.6 45.5   < > 43.5 45.6 44.2 41.0 38.0* 42.6  MCV 90.6 89.2 87.8  --  87.7 91.6 92.1  --   --  91.2  PLT 216 210 228  --  193 218 197  --   --  190   < > = values in this interval not displayed.    Basic Metabolic Panel: Recent Labs  Lab 01/29/19 0440 01/30/19 0420 01/31/19 0500  02/01/19 0515 02/02/19 0550 02/03/19 0445 02/03/19 2003 02/04/19 0353 02/04/19 0530  NA 143 144 145   < > 149* 151* 142 142 143 145  K 3.5 3.8 3.7   < > 3.6 3.6 4.3 3.8 4.0 4.1  CL 103 103 105  --  110 114* 106  --   --  109  CO2 31 30 29   --  25 27 27   --   --  26  GLUCOSE 151* 239* 113*  --  227* 87 256*  --   --  188*  BUN 48* 42* 40*  --  42* 48* 48*  --   --  44*  CREATININE 1.25* 1.22 0.95  --  1.25* 1.49* 1.22  --   --  1.22  CALCIUM 9.4 8.9 9.1  --  8.5* 8.8* 8.5*  --   --  8.7*  MG 2.4 2.2 2.3  --   --   --   --   --   --   --    < > = values in this interval not displayed.   GFR: Estimated Creatinine Clearance: 44 mL/min (by C-G formula based on SCr of 1.22 mg/dL). Recent Labs  Lab 01/29/19 0440 01/30/19 0420 01/31/19 0500 02/01/19 0515 02/02/19 0550 02/03/19 0445 02/04/19 0530  PROCALCITON <0.10 <0.10 <0.10  --   --   --   --   WBC 14.1* 8.8 12.6* 7.7 11.8* 10.8* 10.0    Liver Function Tests: Recent Labs  Lab 01/31/19 0500 02/01/19 0515 02/02/19 0550 02/03/19 0445 02/04/19 0530  AST 17 19 13* 13* 10*  ALT 18 16 15 15 16   ALKPHOS 55 50 51 54 52  BILITOT 0.9 0.9 0.9 1.1 1.1  PROT 6.3* 5.2* 5.3* 5.3* 5.2*  ALBUMIN 2.9* 2.4* 2.5* 2.7* 2.5*   No results for input(s): LIPASE, AMYLASE in the last 168 hours. No results for input(s): AMMONIA in the last 168 hours.  ABG    Component Value Date/Time   PHART 7.420 02/04/2019 0353   PCO2ART 40.9 02/04/2019 0353   PO2ART 80.0 (L) 02/04/2019 0353   HCO3 26.6 02/04/2019 0353   TCO2 28 02/04/2019 0353   ACIDBASEDEF 1.1 04/17/2018 1314   O2SAT 96.0 02/04/2019 0353     Coagulation Profile:  No results for input(s): INR, PROTIME in the last 168 hours.  Cardiac Enzymes: No results for input(s): CKTOTAL, CKMB, CKMBINDEX, TROPONINI in the last 168 hours.  HbA1C: Hemoglobin A1C  Date/Time Value Ref Range Status  04/23/2014 04:16 AM 12.1 (H) 4.2 - 6.3 % Final  Comment:    The American Diabetes Association recommends that a primary goal of therapy should be <7% and that physicians should reevaluate the treatment regimen in patients with HbA1c values consistently >8%.    Hgb A1c MFr Bld  Date/Time Value Ref Range Status  01/26/2019 03:58 AM 6.7 (H) 4.8 - 5.6 % Final    Comment:    (NOTE) Pre diabetes:          5.7%-6.4% Diabetes:              >6.4% Glycemic control for   <7.0% adults with diabetes   09/11/2018 03:05 AM 7.4 (H) 4.8 - 5.6 % Final    Comment:    (NOTE) Pre diabetes:          5.7%-6.4% Diabetes:              >6.4% Glycemic control for   <7.0% adults with diabetes     CBG: Recent Labs  Lab 02/02/19 2122 02/03/19 0746 02/03/19 1142 02/03/19 1634 02/03/19 2133  GLUCAP 269* 221* 170* 163* 135*     Critical care time: 31 minutes     Roselie Awkward, MD [facility] Pager: [phone] Cell: [phone] If no response, call [phone]

## 2019-02-04 NOTE — Progress Notes (Signed)
Pt taken off bipap and placed on 4L HFNC at this time. Pt denies SOB, no increased WOB, no distress. VS within normal limits.

## 2019-02-04 NOTE — Progress Notes (Signed)
Son updated multiple times throughout the shift. Patient status and plan of care dicussed. Son expressed concerns of him aspirating, says he thinks he does this at home too. reassured him that speech consult has been put in and will be evaluating.

## 2019-02-04 NOTE — Progress Notes (Addendum)
[name].  FIE:[id number] DOB: May 01, 1931 DOA: 01/31/2019 PCP: Jearld Fenton, NP    Brief Narrative:  85 year old with a history of CAD, chronic atrial fibrillation on Eliquis, chronic diastolic CHF, COPD, CVA x2, and DM 2, PAD status post BKA June 2020, and obstructive sleep apnea on nightly CPAP who presented to the ED with fever and was initially admitted to [facility] with a diagnosis of pneumonia.  When his Covid test returned positive he was transferred to [facility].  On 1021 he experienced an acute decompensation in which he exhibited marked increased work of breathing with tachypnea and altered mental status.  He was transferred to the ICU and intubated.  Significant Events: 10/15 admit to [facility] 10/17 transfer to [facility] 10/21 transfer to ICU - intubated 10/22 extubated - trial of nightly BIPAP 10/23 trial of nightly HHFNC as BIPAP substitute 10/24 transfer to PCU -acute decompensation requiring transfer back to ICU and reinitiation of BiPAP  COVID-19 specific Treatment: Remdesivir 10/16 > 10/20 Decadron 10/16 Solu-Medrol 10/17 >  Subjective: The patient was doing extremely well yesterday.  He was transferred to the progressive care unit without incident.  Around 6:30 PM after having a few sips of a clear liquid the patient developed severe respiratory distress.  He did not desaturate but his work of breathing was impressively elevated and he was very tachypneic.  Exam revealed very tight expiratory wheezing with prolonged expiratory phase.  He was emergently transferred back to the ICU.  He was dosed with nebulizers and an extra dose of Solu-Medrol.  He was placed on BiPAP.  With this he settled back down.  He remained stable on BiPAP at rest at night.  This morning at the time of our exam he is somewhat sedate but remained stable on BiPAP.  Assessment & Plan:  Acute on chronic hypoxic respiratory failure - Covid pneumonia has completed 5-day course of remdesivir -  will complete a 10-day course of steroid therapy - no evidence of worsening viral pneumonitis  Recent Labs  Lab 01/29/19 0440 01/30/19 0420 01/31/19 0500 02/01/19 0515 02/02/19 0550 02/03/19 0445 02/04/19 0530  DDIMER 0.62* 0.50 0.61* 0.66* 0.65*  --  0.44  FERRITIN  --   --   --   --  148  --  212  CRP 0.9 2.5* 1.3* <0.8 <0.8  --  2.4*  ALT 16 16 18 16 15 15 16   PROCALCITON <0.10 <0.10 <0.10  --   --   --   --     Recurring episodes of acute respiratory failure Exact etiology of the acute decline 10/21 and again 10/24 unclear - much improved/stabilized at this time once again -tolerated BiPAP the first night after his extubation and heated high flow nasal cannula as a substitute the following night - hx most suggestive of microaspiration which is setting of VCD - follow in ICU for now - utilize BIPAP prn, but attempt to transition to [facility] once again  BiPAP dependent obstructive sleep apnea Perhaps his acute decline was related to inability to use BiPAP at [facility] each night, though a lack of severe hypercarbia argues against this - tolerated BiPAP the first night after his extubation, and HHFNC the subsequent night   COPD with chronic hypoxic respiratory failure Reportedly requires home oxygen prn at 2 L/min - wean O2 during day as able   Acute onChronic diastolic CHF EF 16% on TTE 2016 - appears euvolemic on exam - monitor ins and outs -is not  on chronic diuretic therapy -net negative approximately 6 L since admission  Mild hypernatremia Resolved at this time - follow intermittently  CAD s/p CABG Continue beta-blocker, statin, and Zetia - asymptomatic   Chronic atrial fibrillation with RVR CHA2DS2-VASc is 3 - chronic beta-blocker continues - cont Eliquis  Acute kidney injury on mild CKD baseline creatinine 1.2 - creatinine at baseline  Recent Labs  Lab 01/31/19 0500 02/01/19 0515 02/02/19 0550 02/03/19 0445 02/04/19 0530  CREATININE 0.95 1.25* 1.49* 1.22  1.22    Hypertension Experienced some episodes of hypotension last night but blood pressure currently well controlled and within a normal range  Dyslipidemia Continue statin and Zetia  Recent left BKA L BKA and right heel ulcers noted at time of presentation to [facility] - continue wound care - continue PT/OT - may require a rehab stay but is highly motivated to go home  Hypothyroidism Continue usual Synthroid dose -TSH stable  DM2 Hemoglobin stable for now -monitor trend without change today  DVT prophylaxis: Eliquis Code Status: FULL CODE Family Communication: informed by RN that son is now the preferred family contact - called son at both home and mobile numbers w/o answer 10/25 - called both numbers again ~1hr later > still no answer > short voicemail left  Disposition Plan: Keep in ICU due to recurrent episodes of acute idiopathic respiratory distress   Consultants:  PCCM  Antimicrobials:  10/15 cefepime > 10/17 10/15 flagyl > 10/16 10/15 zosyn  10/15 vancomycin  Objective: Blood pressure 106/72, pulse 91, temperature (!) 97.5 F (36.4 C), temperature source Axillary, resp. rate (!) 23, height 5\' 6"  (1.676 m), weight 80 kg, SpO2 98 %.  Intake/Output Summary (Last 24 hours) at 02/04/2019 0820 Last data filed at 02/04/2019 0800 Gross per 24 hour  Intake 491.42 ml  Output 2430 ml  Net -1938.58 ml   Filed Weights   02/08/2019 1413  Weight: 80 kg    Examination: General: Sedate on BiPAP Lungs: Good air movement throughout with no wheezing Cardiovascular: Irregularly irregular -rate controlled Abdomen: Overweight, NT/ND, soft, BS+ Extremities: No significant cyanosis clubbing edema bilateral lower extremities -left BKA wound dressed and dry with no bruit erythema  CBC: Recent Labs  Lab 01/29/19 0440 01/30/19 0420 01/31/19 0500  02/02/19 0550 02/03/19 0445 02/03/19 2003 02/04/19 0353 02/04/19 0530  WBC 14.1* 8.8 12.6*   < > 11.8* 10.8*  --   --  10.0   NEUTROABS 10.8* 7.3 10.3*  --   --   --   --   --   --   HGB 14.2 13.9 14.5   < > 14.0 13.4 13.9 12.9* 13.5  HCT 46.4 44.6 45.5   < > 45.6 44.2 41.0 38.0* 42.6  MCV 90.6 89.2 87.8   < > 91.6 92.1  --   --  91.2  PLT 216 210 228   < > 218 197  --   --  190   < > = values in this interval not displayed.   Basic Metabolic Panel: Recent Labs  Lab 01/29/19 0440 01/30/19 0420 01/31/19 0500  02/02/19 0550 02/03/19 0445 02/03/19 2003 02/04/19 0353 02/04/19 0530  NA 143 144 145   < > 151* 142 142 143 145  K 3.5 3.8 3.7   < > 3.6 4.3 3.8 4.0 4.1  CL 103 103 105   < > 114* 106  --   --  109  CO2 31 30 29    < > 27 27  --   --  26  GLUCOSE 151* 239* 113*   < > 87 256*  --   --  188*  BUN 48* 42* 40*   < > 48* 48*  --   --  44*  CREATININE 1.25* 1.22 0.95   < > 1.49* 1.22  --   --  1.22  CALCIUM 9.4 8.9 9.1   < > 8.8* 8.5*  --   --  8.7*  MG 2.4 2.2 2.3  --   --   --   --   --   --    < > = values in this interval not displayed.   GFR: Estimated Creatinine Clearance: 44 mL/min (by C-G formula based on SCr of 1.22 mg/dL).  Liver Function Tests: Recent Labs  Lab 02/01/19 0515 02/02/19 0550 02/03/19 0445 02/04/19 0530  AST 19 13* 13* 10*  ALT 16 15 15 16   ALKPHOS 50 51 54 52  BILITOT 0.9 0.9 1.1 1.1  PROT 5.2* 5.3* 5.3* 5.2*  ALBUMIN 2.4* 2.5* 2.7* 2.5*    HbA1C: Hemoglobin A1C  Date/Time Value Ref Range Status  04/23/2014 04:16 AM 12.1 (H) 4.2 - 6.3 % Final    Comment:    The American Diabetes Association recommends that a primary goal of therapy should be <7% and that physicians should reevaluate the treatment regimen in patients with HbA1c values consistently >8%.    Hgb A1c MFr Bld  Date/Time Value Ref Range Status  01/26/2019 03:58 AM 6.7 (H) 4.8 - 5.6 % Final    Comment:    (NOTE) Pre diabetes:          5.7%-6.4% Diabetes:              >6.4% Glycemic control for   <7.0% adults with diabetes   09/11/2018 03:05 AM 7.4 (H) 4.8 - 5.6 % Final    Comment:     (NOTE) Pre diabetes:          5.7%-6.4% Diabetes:              >6.4% Glycemic control for   <7.0% adults with diabetes     CBG: Recent Labs  Lab 02/02/19 2122 02/03/19 0746 02/03/19 1142 02/03/19 1634 02/03/19 2133  GLUCAP 269* 221* 170* 163* 135*    Recent Results (from the past 240 hour(s))  Blood Culture (routine x 2)     Status: None   Collection Time: 01/25/19  2:08 PM   Specimen: BLOOD  Result Value Ref Range Status   Specimen Description BLOOD BLOOD LEFT HAND  Final   Special Requests   Final    BOTTLES DRAWN AEROBIC AND ANAEROBIC Blood Culture adequate volume   Culture   Final    NO GROWTH 5 DAYS Performed at [facility], [address] [place], [place] [address]    Report Status 01/30/2019 FINAL  Final  Blood Culture (routine x 2)     Status: None   Collection Time: 01/25/19  2:09 PM   Specimen: BLOOD  Result Value Ref Range Status   Specimen Description BLOOD BLOOD LEFT FOREARM  Final   Special Requests   Final    BOTTLES DRAWN AEROBIC AND ANAEROBIC Blood Culture adequate volume   Culture   Final    NO GROWTH 5 DAYS Performed at [facility], [address] [place], [place] [address]    Report Status 01/30/2019 FINAL  Final  SARS CORONAVIRUS 2 (TAT 6-24 HRS) Nasopharyngeal Nasopharyngeal Swab     Status: Abnormal  Collection Time: 01/25/19  2:09 PM   Specimen: Nasopharyngeal Swab  Result Value Ref Range Status   SARS Coronavirus 2 POSITIVE (A) NEGATIVE Final    Comment: RESULT CALLED TO, READ BACK BY AND VERIFIED WITH: RN B MILLER @ 0149 01/26/19 BY S GEZAHEGN (NOTE) SARS-CoV-2 target nucleic acids are DETECTED. The SARS-CoV-2 RNA is generally detectable in upper and lower respiratory specimens during the acute phase of infection. Positive results are indicative of active infection with SARS-CoV-2. Clinical  correlation with patient history and other diagnostic information is necessary to determine patient infection status.  Positive results do  not rule out bacterial infection or co-infection with other viruses. The expected result is Negative. Fact Sheet for Patients: SugarRoll.be Fact Sheet for Healthcare Providers: https://www.woods-mathews.com/ This test is not yet approved or cleared by the [place] FDA and  has been authorized for detection and/or diagnosis of SARS-CoV-2 by FDA under an Emergency Use Authorization (EUA). This EUA will remain  in effect (meaning this test can be used)  for the duration of the COVID-19 declaration under Section 564(b)(1) of the Act, 21 U.S.C. section 360bbb-3(b)(1), unless the authorization is terminated or revoked sooner. Performed at [facility], [place] [address], [place], [place] [address]   Urine culture     Status: None   Collection Time: 01/25/19  4:16 PM   Specimen: In/Out Cath Urine  Result Value Ref Range Status   Specimen Description   Final    IN/OUT CATH URINE Performed at [facility], [address], [place], [place] [address]    Special Requests   Final    NONE Performed at [facility], [address] [place], [place] [address]    Culture   Final    NO GROWTH Performed at [facility], [place] [address] [place], [place] [address]    Report Status 01/26/2019 FINAL  Final  MRSA PCR Screening     Status: None   Collection Time: 01/26/19  3:52 AM   Specimen: Nasopharyngeal  Result Value Ref Range Status   MRSA by PCR NEGATIVE NEGATIVE Final    Comment:        The GeneXpert MRSA Assay (FDA approved for NASAL specimens only), is one component of a comprehensive MRSA colonization surveillance program. It is not intended to diagnose MRSA infection nor to guide or monitor treatment for MRSA infections. Performed at [facility], [address] [place], [place] [address]      Scheduled Meds: . apixaban  5 mg Oral BID  . budesonide (PULMICORT) nebulizer  solution  0.5 mg Nebulization BID  . Chlorhexidine Gluconate Cloth  6 each Topical Daily  . citalopram  10 mg Oral Daily  . collagenase   Topical Daily  . docusate sodium  100 mg Oral BID  . dorzolamide  1 drop Both Eyes TID  . finasteride  5 mg Oral Daily  . fluconazole  100 mg Oral Daily  . insulin aspart  0-5 Units Subcutaneous QHS  . insulin aspart  0-9 Units Subcutaneous TID WC  . ipratropium-albuterol  3 mL Nebulization Q4H  . latanoprost  1 drop Both Eyes QHS  . levothyroxine  50 mcg Oral QAC breakfast  . LORazepam  0.5 mg Intravenous Once  . methylPREDNISolone (SOLU-MEDROL) injection  60 mg Intravenous Q12H  . metoprolol tartrate  10 mg Intravenous Q6H  . montelukast  10 mg Oral QHS  . pantoprazole  40 mg Oral Daily  . sodium chloride  flush  3 mL Intravenous Q12H  . tamsulosin  0.4 mg Oral QPC supper  . traZODone  100 mg Oral QHS     LOS: 8 days   Cherene Altes, MD [facility] Office  [phone] Pager - Text Page per Amion  If 7PM-7AM, please contact night-coverage per Amion 02/04/2019, 8:20 AM

## 2019-02-05 ENCOUNTER — Inpatient Hospital Stay (HOSPITAL_COMMUNITY): Payer: Medicare Other

## 2019-02-05 DIAGNOSIS — J9601 Acute respiratory failure with hypoxia: Secondary | ICD-10-CM | POA: Diagnosis not present

## 2019-02-05 DIAGNOSIS — U071 COVID-19: Secondary | ICD-10-CM | POA: Diagnosis not present

## 2019-02-05 LAB — BASIC METABOLIC PANEL
Anion gap: 10 (ref 5–15)
BUN: 39 mg/dL — ABNORMAL HIGH (ref 8–23)
CO2: 26 mmol/L (ref 22–32)
Calcium: 8.8 mg/dL — ABNORMAL LOW (ref 8.9–10.3)
Chloride: 103 mmol/L (ref 98–111)
Creatinine, Ser: 1.11 mg/dL (ref 0.61–1.24)
GFR calc Af Amer: >60 mL/min (ref >60)
GFR calc non Af Amer: >60 mL/min (ref >60)
Glucose, Bld: 243 mg/dL — ABNORMAL HIGH (ref 70–99)
Potassium: 4.4 mmol/L (ref 3.5–5.1)
Sodium: 139 mmol/L (ref 135–145)

## 2019-02-05 LAB — CBC
HCT: 43.7 % (ref 39.0–52.0)
Hemoglobin: 13.5 g/dL (ref 13.0–17.0)
MCH: 27.9 pg (ref 26.0–34.0)
MCHC: 30.9 g/dL (ref 30.0–36.0)
MCV: 90.3 fL (ref 80.0–100.0)
Platelets: 175 10*3/uL (ref 150–400)
RBC: 4.84 MIL/uL (ref 4.22–5.81)
RDW: 16 % — ABNORMAL HIGH (ref 11.5–15.5)
WBC: 10.2 10*3/uL (ref 4.0–10.5)
nRBC: 0 % (ref 0.0–0.2)

## 2019-02-05 LAB — GLUCOSE, CAPILLARY
Glucose-Capillary: 257 mg/dL — ABNORMAL HIGH (ref 70–99)
Glucose-Capillary: 285 mg/dL — ABNORMAL HIGH (ref 70–99)
Glucose-Capillary: 176 mg/dL — ABNORMAL HIGH (ref 70–99)
Glucose-Capillary: 187 mg/dL — ABNORMAL HIGH (ref 70–99)

## 2019-02-05 MED ORDER — LORAZEPAM 2 MG/ML IJ SOLN
0.5000 mg | INTRAMUSCULAR | Status: DC | PRN
  Administered 2019-02-06: 15:00:00 0.5 mg via INTRAVENOUS
  Filled 2019-02-05: qty 1

## 2019-02-05 MED ORDER — METHYLPREDNISOLONE SODIUM SUCC 40 MG IJ SOLR
30.0000 mg | Freq: Two times a day (BID) | INTRAMUSCULAR | Status: DC
  Administered 2019-02-05 – 2019-02-07 (×4): 30 mg via INTRAVENOUS
  Filled 2019-02-05 (×4): qty 1

## 2019-02-05 NOTE — Progress Notes (Signed)
  Speech Language Pathology Treatment: Dysphagia  Patient Details Name: [name]. MRN: [id number] DOB: [birth year] Today's Date: 02/05/2019 Time: 5686-1683 SLP Time Calculation (min) (ACUTE ONLY): 30 min  Assessment / Plan / Recommendation Clinical Impression  Returned with OT to prepare patient for transport to radiology for MBS.  Very sleepy, had had Ativan. RR to mid 30s, increased SOB noted with pt able to say only 1-2 words per breath. He consumed limited trials of honey thick liquids and pureed solids with intermittent delayed cough, wet voice, multiple sub-swallows per bolus.  Concerned that pt is having more difficulty protecting airway with POs this afternoon and that he may have difficulty tolerating the procedure. Will defer MBS until tomorrow and re-assess readiness. D/W RN, secure-messaged Dr. Thereasa Solo.  For today, allow meds via puree and occasional sips of honey-thick water only when pt is alert with RR <30 and is not SOB.     HPI HPI: 85 y.o. male with a known history of CAD, Chronic atrial fibrillation on Eliquis, Chronic diastolic CHF EF 72% in 9021, COPD on PRN 02 at home, CVA x 2, DM 2 and PAD status post left BKA in June 2020 who presented to the emergency room with complaints of fevers and was admitted for CAP at [facility], later Covid test came back + and was sent to [facility].  Initial swallow evaluation 10/19 with recs for dysphagia 3 diet, thin liquids and no f/u.  10/21 am pt had coughing episodes with breakfast, precipitating new orders to repeat swallow assessment.  Developed respiratory distress later in the day and was intubated, transferred to ICU.  Extubated 10/22.  Diet resumed, pt moved to PCU, then back to ICU 10/24 for increased WOB/wheezing; on BiPAP.      SLP Plan  MBS       Recommendations  Diet recommendations: NPO(except meds in puree; occasional sips honey-thick water when alert and RR<30) Medication Administration: Whole meds with puree Postural  Changes and/or Swallow Maneuvers: Seated upright 90 degrees                Oral Care Recommendations: Oral care QID SLP Visit Diagnosis: Dysphagia, unspecified (R13.10) Plan: MBS       GO              Lowella Kindley L. Tivis Ringer, [place] CCC/SLP Acute Rehabilitation Services Office number [phone]    Juan Quam Laurice 02/05/2019, 2:04 PM

## 2019-02-05 NOTE — Plan of Care (Signed)
Pt understands and agrees with current POC.

## 2019-02-05 NOTE — Progress Notes (Signed)
Pt A&Ox4, AFIB on monitor, tachypnea with O2 Sats=98% on 2Lpm [place]. Pt sitting in recliner per PT/OT via MaxiMove, BP= 134/89. Denies pain, no signs of resp distress. Son Dominica Severin called unit and was updated on current POC to include swallow eval. Possibly sometime later today in Rad. Dept. No additional needs at this time.

## 2019-02-05 NOTE — Progress Notes (Signed)
Physical Therapy Treatment Patient Details Name: [name]. MRN: [id number] DOB: 1932/01/10 Today's Date: 02/05/2019    History of Present Illness 85 y.o. male with PMH of CAD, atrial fibrillation on Eliquis, CHF, COPD (PRN 02 at home), CVA x2, DM2, and PAD s/p L BKA 09/2018, admitted 01/26/2019 to [place] with CAP; later tested (+) Covid with transfer to [facility].   Developed acute decompensation and AMS requiring intubation 10/21 and transfer to ICU. Extubated 10/22. to PCU then back to ICU for BiPAP 10/24    PT Comments    The patient remains very weak, bth arms weeping moderately. Patient requires assistance of 2 persons and mechanical lift for Out of bed. If DC to home, will need  A lift. Patient  Currently too deconditioned to attempt sliding board transfers safely. Patient on 2 L NV+C with lowest SPO2 88, HR 121. Continue mobility.  Follow Up Recommendations  Home health PT;SNF;Supervision/Assistance - 24 hour     Equipment Recommendations  (lift)    Recommendations for Other Services       Precautions / Restrictions Precautions Precaution Comments: L BKA; tachycardia; currently on 2L    Mobility  Bed Mobility   Bed Mobility: Rolling;Sidelying to Sit Rolling: Max assist Sidelying to sit: Max assist;HOB elevated       General bed mobility comments: MaxA for trunk elevation and scooting hips to EOB  Transfers Overall transfer level: Needs assistance               General transfer comment: Maximove pad placed under pt while seated EOB, required cues for offloading each hip to place pad; transfer to recliner with maximove; pt tolerated well. HR 120's SPO2 lowest 88% on 2 L  Ambulation/Gait                 Stairs             Wheelchair Mobility    Modified Rankin (Stroke Patients Only)       Balance Overall balance assessment: Needs assistance Sitting-balance support: Bilateral upper extremity supported Sitting balance-Leahy Scale:  Fair Sitting balance - Comments: Able to maintain static sitting with min guard; required assist to maintain balance when challenged                                    Cognition Arousal/Alertness: Awake/alert Behavior During Therapy: WFL for tasks assessed/performed Overall Cognitive Status: No family/caregiver present to determine baseline cognitive functioning                                 General Comments: Pt very pleasant and answering questions appropriately; minimal verbalizations unless asked. Some increased time processing      Exercises      General Comments        Pertinent Vitals/Pain Pain Assessment: No/denies pain Faces Pain Scale: Hurts a little bit Pain Location: Generalized Pain Descriptors / Indicators: Discomfort Pain Intervention(s): Monitored during session    Home Living                      Prior Function            PT Goals (current goals can now be found in the care plan section) Progress towards PT goals: Progressing toward goals    Frequency    Min 3X/week(change if SNF)  PT Plan Current plan remains appropriate    Co-evaluation PT/OT/SLP Co-Evaluation/Treatment: Yes Reason for Co-Treatment: For patient/therapist safety;Complexity of the patient's impairments (multi-system involvement);To address functional/ADL transfers PT goals addressed during session: Mobility/safety with mobility OT goals addressed during session: ADL's and self-care      AM-PAC PT "6 Clicks" Mobility   Outcome Measure  Help needed turning from your back to your side while in a flat bed without using bedrails?: Total Help needed moving from lying on your back to sitting on the side of a flat bed without using bedrails?: Total Help needed moving to and from a bed to a chair (including a wheelchair)?: Total Help needed standing up from a chair using your arms (e.g., wheelchair or bedside chair)?: Total Help needed to  walk in hospital room?: Total Help needed climbing 3-5 steps with a railing? : Total 6 Click Score: 6    End of Session Equipment Utilized During Treatment: Oxygen Activity Tolerance: Patient tolerated treatment well Patient left: in chair;with call bell/phone within reach;with nursing/sitter in room Nurse Communication: Mobility status;Need for lift equipment PT Visit Diagnosis: Muscle weakness (generalized) (M62.81)     Time: 7673-4193 PT Time Calculation (min) (ACUTE ONLY): 35 min  Charges:  $Therapeutic Activity: 8-22 mins                     Tresa Endo PT Acute Rehabilitation Services Pager [phone] Office [phone]    Claretha Cooper 02/05/2019, 2:38 PM

## 2019-02-05 NOTE — Progress Notes (Signed)
OT Treatment Note  Returned this pm to assist ST with MBS. Pt very lethargic with increased RR into 30s and increased SOB. 4/4 DOE - only able to speak 2 words at a time; appears more confused this pm. Nsg aware. Max A +2 for all mobility. Will continue to follow.     02/05/19 1552  OT Visit Information  Last OT Received On 02/05/19  Assistance Needed +2  PT/OT/SLP Co-Evaluation/Treatment Yes  Reason for Co-Treatment Complexity of the patient's impairments (multi-system involvement)  OT goals addressed during session ADL's and self-care  History of Present Illness 85 y.o. male with PMH of CAD, atrial fibrillation on Eliquis, CHF, COPD (PRN 02 at home), CVA x2, DM2, and PAD s/p L BKA 09/2018, admitted 01/29/2019 to [place] with CAP; later tested (+) Covid with transfer to [facility].   Developed acute decompensation and AMS requiring intubation 10/21 and transfer to ICU. Extubated 10/22. to PCU then back to ICU for BiPAP 10/24  Precautions  Precaution Comments L BKA; tachycardia; currently on 2L  Pain Assessment  Pain Assessment Faces  Faces Pain Scale 2  Pain Location Generalized  Pain Descriptors / Indicators Discomfort  Pain Intervention(s) Limited activity within patient's tolerance  Cognition  Arousal/Alertness Lethargic;Suspect due to medications  Behavior During Therapy Flat affect  Overall Cognitive Status Impaired/Different from baseline  Area of Impairment Orientation;Attention;Memory;Following commands;Safety/judgement;Awareness;Problem solving  Orientation Level Disoriented to;Time  Current Attention Level Sustained  Memory Decreased recall of precautions;Decreased short-term memory  Following Commands Follows one step commands with increased time  Safety/Judgement Decreased awareness of safety;Decreased awareness of deficits  Awareness Intellectual  Problem Solving Slow processing;Decreased initiation;Difficulty sequencing;Requires verbal cues;Requires tactile cues  General  Comments change in sttus form am session. Pt had Ativan  Upper Extremity Assessment  Upper Extremity Assessment Generalized weakness  Lower Extremity Assessment  Lower Extremity Assessment Defer to PT evaluation  ADL  Overall ADL's  Needs assistance/impaired  Grooming Moderate assistance  Bed Mobility  Overal bed mobility Needs Assistance  General bed mobility comments Maximove used to lift to bed; Max A +2 for rolling R/L  Transfers  Transfer via [place] During Treatment Oxygen (2L)  Activity Tolerance Patient limited by lethargy  Patient left in bed;with call bell/phone within reach;with bed alarm set;with nursing/sitter in room  Nurse Communication Mobility status;Need for lift equipment;Other (comment) (notifying MD of status)  OT Assessment/Plan  OT Plan Discharge plan remains appropriate  OT Visit Diagnosis Other abnormalities of gait and mobility (R26.89);Muscle weakness (generalized) (M62.81)  OT Frequency (ACUTE ONLY) Min 2X/week  Follow Up Recommendations Home health OT;SNF;Supervision/Assistance - 24 hour  OT Equipment None recommended by OT  AM-PAC OT "6 Clicks" Daily Activity Outcome Measure (Version 2)  Help from another person eating meals? 2  Help from another person taking care of personal grooming? 2  Help from another person toileting, which includes using toliet, bedpan, or urinal? 1  Help from another person bathing (including washing, rinsing, drying)? 1  Help from another person to put on and taking off regular upper body clothing? 1  Help from another person to put on and taking off regular lower body clothing? 1  6 Click Score 8  OT Goal Progression  Progress towards OT goals Not progressing toward goals - comment (level of arounsal)  Acute Rehab OT Goals  Patient Stated Goal to get something to drink  OT Goal Formulation Patient unable to participate in goal setting  Time  For Goal Achievement  02/16/19  Potential to Achieve Goals Good  ADL Goals  Pt Will Transfer to Toilet with min assist;with transfer board;regular height toilet;grab bars;bedside commode  Additional ADL Goal #1 Pt will complete bed mobility at min A in preparation for BADL  Additional ADL Goal #2 Pt will recall and/or apply 103 ECS strategies while engaging in ADL task for safe and succesful completion  Additional ADL Goal #3 Pt will sustain O2 sats above 90 during BADL activity  OT Time Calculation  OT Start Time (ACUTE ONLY) 1300  OT Stop Time (ACUTE ONLY) 1333  OT Time Calculation (min) 33 min  OT Treatments  $Therapeutic Activity 8-22 mins  Maurie Boettcher, OT/L   Acute OT Clinical Specialist [place] Pager [phone] Office [phone]

## 2019-02-05 NOTE — Progress Notes (Signed)
Occupational Therapy Treatment Patient Details Name: [name]. MRN: [id number] DOB: 1931/11/11 Today's Date: 02/05/2019    History of present illness 85 y.o. male with PMH of CAD, atrial fibrillation on Eliquis, CHF, COPD (PRN 02 at home), CVA x2, DM2, and PAD s/p L BKA 09/2018, admitted 01/26/2019 to [place] with CAP; later tested (+) Covid with transfer to [facility].   Developed acute decompensation and AMS requiring intubation 10/21 and transfer to ICU. Extubated 10/22. to PCU then back to ICU for BiPAP 10/24   OT comments  Pt cooperative and ready to participate with therapy. Seen on  2L with SpO2 lowest @ 88; HR 121 with 2/4 DOE. Completed IS and Flutter x 10 each. Able to pull @ 250 ml on IS. Sat EOB  @ 10 min with occasional min A prior to using Maximove to progress OOB to chair. IF DC home is home, pt will need 24/7 A and mechanical lift. Pt appropriate for SNF level rehab. Will continue to follow acutely.   Follow Up Recommendations  Home health OT;SNF;Supervision/Assistance - 24 hour    Equipment Recommendations  None recommended by OT    Recommendations for Other Services      Precautions / Restrictions Precautions Precaution Comments: L BKA; tachycardia; currently on 2L       Mobility Bed Mobility Overal bed mobility: Needs Assistance Bed Mobility: Rolling;Sidelying to Sit Rolling: Max assist Sidelying to sit: Max assist;HOB elevated       General bed mobility comments: MaxA for trunk elevation and scooting hips to EOB  Transfers Overall transfer level: Needs assistance               General transfer comment: Maximove pad placed under pt while seated EOB, required cues for offloading each hip to place pad; transfer to recliner with maximove; pt tolerated well. HR 120's SPO2 lowest 88% on 2 L    Balance Overall balance assessment: Needs assistance Sitting-balance support: Bilateral upper extremity supported Sitting balance-Leahy Scale: Fair Sitting  balance - Comments: Able to maintain static sitting with min guard; required assist to maintain balance when challenged                                   ADL either performed or assessed with clinical judgement   ADL       Grooming: Minimal assistance;Sitting                               Functional mobility during ADLs: Maximal assistance;+2 for physical assistance General ADL Comments: Mobilized to chair with Maximove after sitting EOB x 10 min     Vision       Perception     Praxis      Cognition Arousal/Alertness: Awake/alert Behavior During Therapy: WFL for tasks assessed/performed Overall Cognitive Status: No family/caregiver present to determine baseline cognitive functioning                                 General Comments: Pt very pleasant and answering questions appropriately; minimal verbalizations unless asked. Some increased time processing. Stated that being in a chair "so long makes my butt hurt"        Exercises Other Exercises Other Exercises: incentive spirometer x 10 - able to pull @ 217ml Other Exercises: flutter valve  x 10   Shoulder Instructions       General Comments      Pertinent Vitals/ Pain       Pain Assessment: Faces Faces Pain Scale: Hurts a little bit Pain Location: Generalized Pain Descriptors / Indicators: Discomfort Pain Intervention(s): Limited activity within patient's tolerance  Home Living                                          Prior Functioning/Environment              Frequency  Min 2X/week        Progress Toward Goals  OT Goals(current goals can now be found in the care plan section)  Progress towards OT goals: Progressing toward goals  Acute Rehab OT Goals Patient Stated Goal: to get healthy and go home OT Goal Formulation: With patient Time For Goal Achievement: 02/16/19 Potential to Achieve Goals: Good ADL Goals Pt Will Transfer to  Toilet: with min assist;with transfer board;regular height toilet;grab bars;bedside commode Additional ADL Goal #1: Pt will complete bed mobility at min A in preparation for BADL Additional ADL Goal #2: Pt will recall and/or apply 103 ECS strategies while engaging in ADL task for safe and succesful completion Additional ADL Goal #3: Pt will sustain O2 sats above 90 during BADL activity  Plan Discharge plan remains appropriate    Co-evaluation    PT/OT/SLP Co-Evaluation/Treatment: Yes Reason for Co-Treatment: Complexity of the patient's impairments (multi-system involvement);For patient/therapist safety;To address functional/ADL transfers PT goals addressed during session: Mobility/safety with mobility OT goals addressed during session: ADL's and self-care      AM-PAC OT "6 Clicks" Daily Activity     Outcome Measure   Help from another person eating meals?: A Little Help from another person taking care of personal grooming?: A Lot Help from another person toileting, which includes using toliet, bedpan, or urinal?: Total Help from another person bathing (including washing, rinsing, drying)?: A Lot Help from another person to put on and taking off regular upper body clothing?: A Lot Help from another person to put on and taking off regular lower body clothing?: Total 6 Click Score: 11    End of Session Equipment Utilized During Treatment: Oxygen(2L)  OT Visit Diagnosis: Other abnormalities of gait and mobility (R26.89);Muscle weakness (generalized) (M62.81)   Activity Tolerance Patient tolerated treatment well   Patient Left in chair;with call bell/phone within reach;with chair alarm set   Nurse Communication Mobility status;Need for lift equipment        Time: [phone] OT Time Calculation (min): 35 min  Charges: OT General Charges $OT Visit: 1 Visit OT Treatments $Self Care/Home Management : 8-22 mins  Maurie Boettcher, OT/L   Acute OT Clinical Specialist [place] Pager [phone] Office [phone]    [facility] 02/05/2019, 3:48 PM

## 2019-02-05 NOTE — Progress Notes (Signed)
  Speech Language Pathology Treatment: Dysphagia  Patient Details Name: [name]. MRN: [id number] DOB: 01/23/1932 Today's Date: 02/05/2019 Time:  -     Assessment / Plan / Recommendation Clinical Impression  Events of this weekend noted. Pt transitioned to 2 liters  this am and tolerating well. Sitting in recliner after session with OT/PT.  RR <30, oxygenating well.  Pt requesting POs.  Provided with purees and honey-thick liquids this am - demonstrated initial coughing after honey liquids - this ceased after several swallows.  Overall, he appeared to tolerate conservative POs well. Given recent events, recommend proceeding with MBS this afternoon to better assess dysphagia.  D/W Drs. McQuaid and [employer].    HPI HPI: 85 y.o. male with a known history of CAD, Chronic atrial fibrillation on Eliquis, Chronic diastolic CHF EF 40% in 9811, COPD on PRN 02 at home, CVA x 2, DM 2 and PAD status post left BKA in June 2020 who presented to the emergency room with complaints of fevers and was admitted for CAP at [facility], later Covid test came back + and was sent to [facility].  Initial swallow evaluation 10/19 with recs for dysphagia 3 diet, thin liquids and no f/u.  10/21 am pt had coughing episodes with breakfast, precipitating new orders to repeat swallow assessment.  Developed respiratory distress later in the day and was intubated, transferred to ICU.  Extubated 10/22.  Diet resumed, pt moved to PCU, then back to ICU 10/24 for increased WOB/wheezing; on BiPAP.      SLP Plan  MBS       Recommendations  Diet recommendations: Dysphagia 1 (puree) Medication Administration: Whole meds with puree Supervision: Patient able to self feed Postural Changes and/or Swallow Maneuvers: Seated upright 90 degrees(rest breaks)                Oral Care Recommendations: Oral care BID SLP Visit Diagnosis: Dysphagia, unspecified (R13.10) Plan: MBS       GO              Shoshanah Dapper L. Tivis Ringer, [place]  CCC/SLP Acute Rehabilitation Services Office number [phone]    Juan Quam Laurice 02/05/2019, 11:24 AM

## 2019-02-05 NOTE — Progress Notes (Signed)
[name].  RSW:[id number] DOB: 1931-05-06 DOA: 01/19/2019 PCP: Jearld Fenton, NP    Brief Narrative:  85 year old with a history of CAD, chronic atrial fibrillation on Eliquis, chronic diastolic CHF, COPD, CVA x2, and DM 2, PAD status post BKA June 2020, and obstructive sleep apnea on nightly CPAP who presented to the ED with fever and was initially admitted to [facility] with a diagnosis of pneumonia.  When his Covid test returned positive he was transferred to [facility].  On 1021 he experienced an acute decompensation in which he exhibited marked increased work of breathing with tachypnea and altered mental status.  He was transferred to the ICU and intubated.  Significant Events: 10/15 admit to [facility] 10/17 transfer to [facility] 10/21 transfer to ICU - intubated 10/22 extubated - trial of nightly BIPAP 10/23 trial of nightly HHFNC as BIPAP substitute 10/24 transfer to PCU -acute decompensation requiring transfer back to ICU and reinitiation of BiPAP  COVID-19 specific Treatment: Remdesivir 10/16 > 10/20 Decadron 10/16 Solu-Medrol 10/17 >  Subjective: Did well last night with BIPAP in the ICU. Alert and conversant this morning. Says he feels great. Denies cp, n/v, or abdom pain.   Assessment & Plan:  Acute on chronic hypoxic respiratory failure - Covid pneumonia has completed 5-day course of remdesivir - to extend steroid course and taper given wheezing concerns - no evidence of worsening viral pneumonitis  Recurring episodes of acute respiratory failure Exact etiology of his acute decline 10/21 and again 10/24 unclear - much improved/stabilized at this time -tolerated BiPAP the first night after his extubation and heated high flow nasal cannula as a substitute the following night - hx most suggestive of microaspiration which is setting of VCD - cont to follow in ICU for now - cont nightly BIPAP for now   BiPAP dependent obstructive sleep apnea Perhaps his acute decline  was related to inability to use BiPAP at [facility] each night, though a lack of severe hypercarbia argues against this - cont QHS BIPAP in ICU for now   COPD with chronic hypoxic respiratory failure Reportedly requires home oxygen prn at 2 L/min - wean O2 during day as able - wean steroids to off   Acute onChronic diastolic CHF EF 09% on TTE 2016 - appears euvolemic on exam - monitor ins and outs -is not on chronic diuretic therapy -net negative approximately 9 L since admission  Mild hypernatremia Resolved  CAD s/p CABG Continue beta-blocker, statin, and Zetia - asymptomatic   Chronic atrial fibrillation with RVR CHA2DS2-VASc is 3 - chronic beta-blocker continues - cont Eliquis - HR controlled   Acute kidney injury on mild CKD baseline creatinine 1.2 - creatinine at baseline  Hypertension Blood pressure currently controlled  Dyslipidemia Continue statin and Zetia  Recent left BKA L BKA and right heel ulcers noted at time of presentation to [facility] - continue wound care - continue PT/OT - may require a rehab stay but is highly motivated to go home  Hypothyroidism Continue usual Synthroid dose -TSH stable  DM2 CBG quite variable -follow without change for now  DVT prophylaxis: Eliquis Code Status: FULL CODE Family Communication:  Disposition Plan: Keep in ICU due to recurrent episodes of acute idiopathic respiratory distress   Consultants:  PCCM  Antimicrobials:  10/15 cefepime > 10/17 10/15 flagyl > 10/16 10/15 zosyn  10/15 vancomycin  Objective: Blood pressure 131/78, pulse (!) 104, temperature 97.8 F (36.6 C), temperature source Axillary, resp. rate (!) 23, height 5'  6" (1.676 m), weight 80 kg, SpO2 99 %.  Intake/Output Summary (Last 24 hours) at 02/05/2019 1644 Last data filed at 02/05/2019 1330 Gross per 24 hour  Intake -  Output 1550 ml  Net -1550 ml   Filed Weights   01/15/2019 1413  Weight: 80 kg    Examination: General: Alert and  conversant  Lungs: Good air movement throughout -no active wheezing Cardiovascular: Irregularly irregular without murmur Abdomen: Overweight, NT/ND, soft, BS+ Extremities: no signif edema - left BKA wound dressed and dry   CBC: Recent Labs  Lab 01/30/19 0420 01/31/19 0500  02/03/19 0445  02/04/19 0353 02/04/19 0530 02/05/19 0543  WBC 8.8 12.6*   < > 10.8*  --   --  10.0 10.2  NEUTROABS 7.3 10.3*  --   --   --   --   --   --   HGB 13.9 14.5   < > 13.4   < > 12.9* 13.5 13.5  HCT 44.6 45.5   < > 44.2   < > 38.0* 42.6 43.7  MCV 89.2 87.8   < > 92.1  --   --  91.2 90.3  PLT 210 228   < > 197  --   --  190 175   < > = values in this interval not displayed.   Basic Metabolic Panel: Recent Labs  Lab 01/30/19 0420 01/31/19 0500  02/03/19 0445  02/04/19 0353 02/04/19 0530 02/05/19 0543  NA 144 145   < > 142   < > 143 145 139  K 3.8 3.7   < > 4.3   < > 4.0 4.1 4.4  CL 103 105   < > 106  --   --  109 103  CO2 30 29   < > 27  --   --  26 26  GLUCOSE 239* 113*   < > 256*  --   --  188* 243*  BUN 42* 40*   < > 48*  --   --  44* 39*  CREATININE 1.22 0.95   < > 1.22  --   --  1.22 1.11  CALCIUM 8.9 9.1   < > 8.5*  --   --  8.7* 8.8*  MG 2.2 2.3  --   --   --   --   --   --    < > = values in this interval not displayed.   GFR: Estimated Creatinine Clearance: 48.4 mL/min (by C-G formula based on SCr of 1.11 mg/dL).  Liver Function Tests: Recent Labs  Lab 02/01/19 0515 02/02/19 0550 02/03/19 0445 02/04/19 0530  AST 19 13* 13* 10*  ALT 16 15 15 16   ALKPHOS 50 51 54 52  BILITOT 0.9 0.9 1.1 1.1  PROT 5.2* 5.3* 5.3* 5.2*  ALBUMIN 2.4* 2.5* 2.7* 2.5*    HbA1C: Hemoglobin A1C  Date/Time Value Ref Range Status  04/23/2014 04:16 AM 12.1 (H) 4.2 - 6.3 % Final    Comment:    The American Diabetes Association recommends that a primary goal of therapy should be <7% and that physicians should reevaluate the treatment regimen in patients with HbA1c values consistently >8%.     Hgb A1c MFr Bld  Date/Time Value Ref Range Status  01/26/2019 03:58 AM 6.7 (H) 4.8 - 5.6 % Final    Comment:    (NOTE) Pre diabetes:          5.7%-6.4% Diabetes:              >  6.4% Glycemic control for   <7.0% adults with diabetes   09/11/2018 03:05 AM 7.4 (H) 4.8 - 5.6 % Final    Comment:    (NOTE) Pre diabetes:          5.7%-6.4% Diabetes:              >6.4% Glycemic control for   <7.0% adults with diabetes     CBG: Recent Labs  Lab 02/04/19 1242 02/04/19 1811 02/04/19 2039 02/05/19 0824 02/05/19 1249  GLUCAP 187* 238* 190* 257* 285*    Recent Results (from the past 240 hour(s))  MRSA PCR Screening     Status: None   Collection Time: 02/04/19  5:55 AM   Specimen: Nasopharyngeal  Result Value Ref Range Status   MRSA by PCR NEGATIVE NEGATIVE Final    Comment:        The GeneXpert MRSA Assay (FDA approved for NASAL specimens only), is one component of a comprehensive MRSA colonization surveillance program. It is not intended to diagnose MRSA infection nor to guide or monitor treatment for MRSA infections. Performed at [facility], [place] [address] [place], [place] [address]      Scheduled Meds: . apixaban  5 mg Oral BID  . Chlorhexidine Gluconate Cloth  6 each Topical Daily  . citalopram  10 mg Oral Daily  . collagenase   Topical Daily  . docusate sodium  100 mg Oral BID  . dorzolamide  1 drop Both Eyes TID  . finasteride  5 mg Oral Daily  . fluconazole  100 mg Oral Daily  . insulin aspart  0-5 Units Subcutaneous QHS  . insulin aspart  0-9 Units Subcutaneous TID WC  . Ipratropium-Albuterol  1 puff Inhalation Q6H   Or  . ipratropium-albuterol  3 mL Nebulization Q6H  . latanoprost  1 drop Both Eyes QHS  . levothyroxine  50 mcg Oral QAC breakfast  . methylPREDNISolone (SOLU-MEDROL) injection  60 mg Intravenous Q12H  . metoprolol succinate  50 mg Oral BID  . montelukast  10 mg Oral QHS  . pantoprazole  40 mg Oral Daily  . sodium  chloride flush  3 mL Intravenous Q12H  . tamsulosin  0.4 mg Oral QPC supper  . traZODone  100 mg Oral QHS     LOS: 9 days   Cherene Altes, MD [facility] Office  [phone] Pager - Text Page per Amion  If 7PM-7AM, please contact night-coverage per Amion 02/05/2019, 4:44 PM

## 2019-02-05 NOTE — Progress Notes (Signed)
NAME:  [name]., MRN:  [id number], DOB:  12/09/1931, LOS: 9 ADMISSION DATE:  01/14/2019, CONSULTATION DATE:  10/21 REFERRING MD:  Waldron Labs, CHIEF COMPLAINT:  Dyspnea   Brief History   85 year old male admitted on October 17 for Covid pneumonia, treated with steroids and remdesivir, moved to the intensive care unit on October 21 in the setting of worsening respiratory failure without significant hypoxemia.  Past Medical History  Neuropathy History of nephrolithiasis Hypertension Hyperlipidemia GERD Diabetes mellitus type 2 History of stroke in 2016 and 1999 COPD on as needed oxygen at home Chronic diastolic heart failure, 2778 TTE> LVEF 65% on RV normal, valves OK Chronic atrial fibrillation Coronary artery disease  Significant Hospital Events   10/17 admission, steroids, remdesivir 10/18-20, several days of intermittent lethargy and dyspnea 10/21 dyspnea, worsenign lethargy moved to ICU for intubation 10/22 extubation, BIPAP overnight 10/23 more awake and alert after BIPAP, transitioned to nightly heated high flow to minimize aerosolization risk 10/23 awake and alert, not dyspneic 10/24 moved to PCU, then moved back to ICU that evening for increased work of breathing, wheezing placed on BIPAP with good result  Consults:  PCCM  Procedures:  10/17 ETT > 10/22  Significant Diagnostic Tests:    Micro Data:  10/15 SARS COV 2 > positive 10/16 blood >  10/15 urine >   Antimicrobials/COVID Rx:  10/15 cefepime > 10/17 10/15 flagyl > 10/16 10/15 zosyn  10/16 remdesivir > 10/20 10/15 vancomycin x 1   Interim history/subjective:   Slept on BIPAP Comfortable again this mornign More awake, alert Eating OK  Objective   Blood pressure 131/78, pulse (!) 104, temperature 97.8 F (36.6 C), temperature source Axillary, resp. rate (!) 23, height 5\' 6"  (1.676 m), weight 80 kg, SpO2 99 %.    Vent Mode: BIPAP FiO2 (%):  [40 %] 40 % Set Rate:  [15 bmp] 15 bmp  PEEP:  [5 cmH20] 5 cmH20   Intake/Output Summary (Last 24 hours) at 02/05/2019 1428 Last data filed at 02/05/2019 0600 Gross per 24 hour  Intake -  Output 875 ml  Net -875 ml   Filed Weights   02/01/2019 1413  Weight: 80 kg    Examination:  General:  Chronically ill appearing, resting comfortably in chair, working with PT HENT: NCAT OP clear PULM: CTA B, normal effort CV: RRR, no mgr GI: BS+, soft, nontender MSK: normal bulk and tone, s/p  Neuro: awake, alert, no distress, MAEW  10/25 CXR > RUL infiltrate persistent bilateral interstitial infiltrate  Resolved Hospital Problem list     Assessment & Plan:  Acute respiratory failure due to increase work of breathing again 10/24> bronchospasm vs vocal cord dysfunction (favor the latter) as these episodes occur after drinking liquids COVID pneumonia  Doubt CHF as net negative Continue to monitor in ICU setting overnight SLP evaluation today for possible aspiration BIPAP at night Wean steroids this week: could likely taper off with prednisone over 5-7 days Continue celexa and prn anxiolytics for [facility] management Continue pantoprazole for GERD  Continue to administer O2 for O2 saturation > 85%  COPD baseline, mild exacerbation? combivent QID Taper steroids this week   Best practice:  Diet: tube feeding Pain/Anxiety/Delirium protocol (if indicated): n/a VAP protocol (if indicated): n/a DVT prophylaxis: full dose lovenox GI prophylaxis: pantoprazole daily Glucose control: SSI Mobility: bed rest Code Status: FULL Family Communication: per [facility] Disposition: consider LTAC placement  Labs   CBC: Recent Labs  Lab 01/30/19 0420 01/31/19 0500  02/01/19  6948 02/02/19 0550 02/03/19 0445 02/03/19 2003 02/04/19 0353 02/04/19 0530 02/05/19 0543  WBC 8.8 12.6*  --  7.7 11.8* 10.8*  --   --  10.0 10.2  NEUTROABS 7.3 10.3*  --   --   --   --   --   --   --   --   HGB 13.9 14.5   < > 13.8 14.0 13.4 13.9 12.9* 13.5 13.5   HCT 44.6 45.5   < > 43.5 45.6 44.2 41.0 38.0* 42.6 43.7  MCV 89.2 87.8  --  87.7 91.6 92.1  --   --  91.2 90.3  PLT 210 228  --  193 218 197  --   --  190 175   < > = values in this interval not displayed.    Basic Metabolic Panel: Recent Labs  Lab 01/30/19 0420 01/31/19 0500  02/01/19 0515 02/02/19 0550 02/03/19 0445 02/03/19 2003 02/04/19 0353 02/04/19 0530 02/05/19 0543  NA 144 145   < > 149* 151* 142 142 143 145 139  K 3.8 3.7   < > 3.6 3.6 4.3 3.8 4.0 4.1 4.4  CL 103 105  --  110 114* 106  --   --  109 103  CO2 30 29  --  25 27 27   --   --  26 26  GLUCOSE 239* 113*  --  227* 87 256*  --   --  188* 243*  BUN 42* 40*  --  42* 48* 48*  --   --  44* 39*  CREATININE 1.22 0.95  --  1.25* 1.49* 1.22  --   --  1.22 1.11  CALCIUM 8.9 9.1  --  8.5* 8.8* 8.5*  --   --  8.7* 8.8*  MG 2.2 2.3  --   --   --   --   --   --   --   --    < > = values in this interval not displayed.   GFR: Estimated Creatinine Clearance: 48.4 mL/min (by C-G formula based on SCr of 1.11 mg/dL). Recent Labs  Lab 01/30/19 0420 01/31/19 0500  02/02/19 0550 02/03/19 0445 02/04/19 0530 02/05/19 0543  PROCALCITON <0.10 <0.10  --   --   --   --   --   WBC 8.8 12.6*   < > 11.8* 10.8* 10.0 10.2   < > = values in this interval not displayed.    Liver Function Tests: Recent Labs  Lab 01/31/19 0500 02/01/19 0515 02/02/19 0550 02/03/19 0445 02/04/19 0530  AST 17 19 13* 13* 10*  ALT 18 16 15 15 16   ALKPHOS 55 50 51 54 52  BILITOT 0.9 0.9 0.9 1.1 1.1  PROT 6.3* 5.2* 5.3* 5.3* 5.2*  ALBUMIN 2.9* 2.4* 2.5* 2.7* 2.5*   No results for input(s): LIPASE, AMYLASE in the last 168 hours. No results for input(s): AMMONIA in the last 168 hours.  ABG    Component Value Date/Time   PHART 7.420 02/04/2019 0353   PCO2ART 40.9 02/04/2019 0353   PO2ART 80.0 (L) 02/04/2019 0353   HCO3 26.6 02/04/2019 0353   TCO2 28 02/04/2019 0353   ACIDBASEDEF 1.1 04/17/2018 1314   O2SAT 96.0 02/04/2019 0353      Coagulation Profile: No results for input(s): INR, PROTIME in the last 168 hours.  Cardiac Enzymes: No results for input(s): CKTOTAL, CKMB, CKMBINDEX, TROPONINI in the last 168 hours.  HbA1C: Hemoglobin A1C  Date/Time Value Ref Range Status  04/23/2014 04:16 AM 12.1 (H) 4.2 - 6.3 % Final    Comment:    The American Diabetes Association recommends that a primary goal of therapy should be <7% and that physicians should reevaluate the treatment regimen in patients with HbA1c values consistently >8%.    Hgb A1c MFr Bld  Date/Time Value Ref Range Status  01/26/2019 03:58 AM 6.7 (H) 4.8 - 5.6 % Final    Comment:    (NOTE) Pre diabetes:          5.7%-6.4% Diabetes:              >6.4% Glycemic control for   <7.0% adults with diabetes   09/11/2018 03:05 AM 7.4 (H) 4.8 - 5.6 % Final    Comment:    (NOTE) Pre diabetes:          5.7%-6.4% Diabetes:              >6.4% Glycemic control for   <7.0% adults with diabetes     CBG: Recent Labs  Lab 02/04/19 1242 02/04/19 1811 02/04/19 2039 02/05/19 0824 02/05/19 1249  GLUCAP 187* 238* 190* 257* 285*     Critical care time: n/a      Roselie Awkward, MD [facility] Pager: [phone] Cell: [phone] If no response, call [phone]

## 2019-02-05 NOTE — Progress Notes (Signed)
Bipap removed and placed patient on 2l nasal cannula.  Patient tolerated well.

## 2019-02-06 ENCOUNTER — Inpatient Hospital Stay (HOSPITAL_COMMUNITY): Payer: Medicare Other

## 2019-02-06 ENCOUNTER — Other Ambulatory Visit: Payer: Self-pay | Admitting: Internal Medicine

## 2019-02-06 ENCOUNTER — Other Ambulatory Visit: Payer: Self-pay | Admitting: Pulmonary Disease

## 2019-02-06 DIAGNOSIS — R579 Shock, unspecified: Secondary | ICD-10-CM | POA: Diagnosis not present

## 2019-02-06 DIAGNOSIS — G9340 Encephalopathy, unspecified: Secondary | ICD-10-CM

## 2019-02-06 DIAGNOSIS — J9601 Acute respiratory failure with hypoxia: Secondary | ICD-10-CM | POA: Diagnosis not present

## 2019-02-06 DIAGNOSIS — U071 COVID-19: Secondary | ICD-10-CM | POA: Diagnosis not present

## 2019-02-06 DIAGNOSIS — J80 Acute respiratory distress syndrome: Secondary | ICD-10-CM

## 2019-02-06 DIAGNOSIS — L8990 Pressure ulcer of unspecified site, unspecified stage: Secondary | ICD-10-CM

## 2019-02-06 LAB — POCT I-STAT 7, (LYTES, BLD GAS, ICA,H+H)
Acid-Base Excess: 4 mmol/L — ABNORMAL HIGH (ref 0.0–2.0)
Acid-Base Excess: 4 mmol/L — ABNORMAL HIGH (ref 0.0–2.0)
Bicarbonate: 28.2 mmol/L — ABNORMAL HIGH (ref 20.0–28.0)
Bicarbonate: 29.8 mmol/L — ABNORMAL HIGH (ref 20.0–28.0)
Calcium, Ion: 1.29 mmol/L (ref 1.15–1.40)
Calcium, Ion: 1.28 mmol/L (ref 1.15–1.40)
HCT: 44 % (ref 39.0–52.0)
HCT: 40 % (ref 39.0–52.0)
Hemoglobin: 15 g/dL (ref 13.0–17.0)
Hemoglobin: 13.6 g/dL (ref 13.0–17.0)
O2 Saturation: 91 %
O2 Saturation: 100 %
Potassium: 3.9 mmol/L (ref 3.5–5.1)
Potassium: 4.2 mmol/L (ref 3.5–5.1)
Sodium: 143 mmol/L (ref 135–145)
Sodium: 144 mmol/L (ref 135–145)
TCO2: 29 mmol/L (ref 22–32)
TCO2: 31 mmol/L (ref 22–32)
pCO2 arterial: 40.7 mmHg (ref 32.0–48.0)
pCO2 arterial: 48.2 mmHg — ABNORMAL HIGH (ref 32.0–48.0)
pH, Arterial: 7.449 (ref 7.350–7.450)
pH, Arterial: 7.399 (ref 7.350–7.450)
pO2, Arterial: 60 mmHg — ABNORMAL LOW (ref 83.0–108.0)
pO2, Arterial: 218 mmHg — ABNORMAL HIGH (ref 83.0–108.0)

## 2019-02-06 LAB — URINALYSIS, ROUTINE W REFLEX MICROSCOPIC
Bilirubin Urine: NEGATIVE
Glucose, UA: NEGATIVE mg/dL
Ketones, ur: NEGATIVE mg/dL
Nitrite: NEGATIVE
Protein, ur: NEGATIVE mg/dL
RBC / HPF: >50 RBC/hpf — ABNORMAL HIGH (ref 0–5)
Specific Gravity, Urine: 1.009 (ref 1.005–1.030)
pH: 5 (ref 5.0–8.0)

## 2019-02-06 LAB — GLUCOSE, CAPILLARY
Glucose-Capillary: 190 mg/dL — ABNORMAL HIGH (ref 70–99)
Glucose-Capillary: 229 mg/dL — ABNORMAL HIGH (ref 70–99)
Glucose-Capillary: 169 mg/dL — ABNORMAL HIGH (ref 70–99)
Glucose-Capillary: 159 mg/dL — ABNORMAL HIGH (ref 70–99)

## 2019-02-06 MED ORDER — MIDAZOLAM HCL 2 MG/2ML IJ SOLN
INTRAMUSCULAR | Status: AC
  Filled 2019-02-06: qty 4

## 2019-02-06 MED ORDER — DEXMEDETOMIDINE HCL IN NACL 400 MCG/100ML IV SOLN
0.0000 ug/kg/h | INTRAVENOUS | Status: AC
  Administered 2019-02-06: 19:00:00 0.2 ug/kg/h via INTRAVENOUS
  Administered 2019-02-08 (×2): 0.4 ug/kg/h via INTRAVENOUS
  Administered 2019-02-09: 0.3 ug/kg/h via INTRAVENOUS
  Filled 2019-02-06 (×4): qty 100

## 2019-02-06 MED ORDER — FENTANYL CITRATE (PF) 100 MCG/2ML IJ SOLN: 25.0000 ug | Freq: Once | INTRAMUSCULAR | Status: DC

## 2019-02-06 MED ORDER — FENTANYL CITRATE (PF) 100 MCG/2ML IJ SOLN
INTRAMUSCULAR | Status: AC
  Filled 2019-02-06: qty 2

## 2019-02-06 MED ORDER — FENTANYL CITRATE (PF) 100 MCG/2ML IJ SOLN
100.0000 ug | Freq: Once | INTRAMUSCULAR | Status: AC
  Administered 2019-02-06: 18:00:00 100 ug via INTRAVENOUS

## 2019-02-06 MED ORDER — METOPROLOL TARTRATE 5 MG/5ML IV SOLN
10.0000 mg | Freq: Four times a day (QID) | INTRAVENOUS | Status: DC
  Administered 2019-02-06 – 2019-02-08 (×7): 10 mg via INTRAVENOUS
  Filled 2019-02-06 (×6): qty 10

## 2019-02-06 MED ORDER — VECURONIUM BOLUS VIA INFUSION: 10.0000 mg | Freq: Once | INTRAVENOUS | Status: DC

## 2019-02-06 MED ORDER — FENTANYL CITRATE (PF) 100 MCG/2ML IJ SOLN
INTRAMUSCULAR | Status: AC
  Administered 2019-02-06: 18:00:00 100 ug via INTRAVENOUS
  Filled 2019-02-06: qty 2

## 2019-02-06 MED ORDER — APIXABAN 5 MG PO TABS
5.0000 mg | ORAL_TABLET | Freq: Two times a day (BID) | ORAL | Status: DC
  Administered 2019-02-06 – 2019-02-09 (×6): 5 mg
  Filled 2019-02-06 (×6): qty 1

## 2019-02-06 MED ORDER — NOREPINEPHRINE 4 MG/250ML-% IV SOLN
INTRAVENOUS | Status: AC
  Administered 2019-02-06: 18:00:00 6 ug/min via INTRAVENOUS
  Filled 2019-02-06: qty 250

## 2019-02-06 MED ORDER — MIDAZOLAM HCL 2 MG/2ML IJ SOLN
2.0000 mg | Freq: Once | INTRAMUSCULAR | Status: AC
  Administered 2019-02-06: 18:00:00 2 mg via INTRAVENOUS

## 2019-02-06 MED ORDER — ROCURONIUM BROMIDE 10 MG/ML (PF) SYRINGE
PREFILLED_SYRINGE | INTRAVENOUS | Status: AC
  Filled 2019-02-06: qty 10

## 2019-02-06 MED ORDER — PANTOPRAZOLE SODIUM 40 MG PO PACK
40.0000 mg | PACK | Freq: Every day | ORAL | Status: DC
  Administered 2019-02-07 – 2019-02-09 (×3): 40 mg
  Filled 2019-02-06 (×4): qty 20

## 2019-02-06 MED ORDER — FENTANYL BOLUS VIA INFUSION
25.0000 ug | INTRAVENOUS | Status: DC | PRN
  Administered 2019-02-08: 25 ug via INTRAVENOUS
  Filled 2019-02-06: qty 25

## 2019-02-06 MED ORDER — PROPOFOL 10 MG/ML IV BOLUS
INTRAVENOUS | Status: AC
  Filled 2019-02-06: qty 20

## 2019-02-06 MED ORDER — SUCCINYLCHOLINE CHLORIDE 200 MG/10ML IV SOSY
PREFILLED_SYRINGE | INTRAVENOUS | Status: AC
  Filled 2019-02-06: qty 10

## 2019-02-06 MED ORDER — FENTANYL 2500MCG IN NS 250ML (10MCG/ML) PREMIX INFUSION
INTRAVENOUS | Status: AC
  Administered 2019-02-06: 100 ug/h via INTRAVENOUS
  Filled 2019-02-06: qty 250

## 2019-02-06 MED ORDER — FENTANYL 2500MCG IN NS 250ML (10MCG/ML) PREMIX INFUSION
25.0000 ug/h | INTRAVENOUS | Status: DC
  Administered 2019-02-06: 19:00:00 100 ug/h via INTRAVENOUS
  Administered 2019-02-08: 125 ug/h via INTRAVENOUS
  Filled 2019-02-06: qty 250

## 2019-02-06 MED ORDER — ETOMIDATE 2 MG/ML IV SOLN
INTRAVENOUS | Status: AC
  Administered 2019-02-06: 20 mg via INTRAVENOUS
  Filled 2019-02-06: qty 20

## 2019-02-06 MED ORDER — NOREPINEPHRINE 4 MG/250ML-% IV SOLN
0.0000 ug/min | INTRAVENOUS | Status: DC
  Administered 2019-02-06: 18:00:00 6 ug/min via INTRAVENOUS
  Administered 2019-02-07: 8 ug/min via INTRAVENOUS
  Administered 2019-02-08: 17 ug/min via INTRAVENOUS
  Administered 2019-02-08: 2 ug/min via INTRAVENOUS
  Administered 2019-02-08 – 2019-02-09 (×2): 16 ug/min via INTRAVENOUS
  Administered 2019-02-09 (×2): 18 ug/min via INTRAVENOUS
  Administered 2019-02-09: 22 ug/min via INTRAVENOUS
  Filled 2019-02-06: qty 500
  Filled 2019-02-06 (×7): qty 250

## 2019-02-06 MED ORDER — STERILE WATER FOR INJECTION IJ SOLN
INTRAMUSCULAR | Status: AC
  Filled 2019-02-06: qty 10

## 2019-02-06 MED ORDER — ETOMIDATE 2 MG/ML IV SOLN
20.0000 mg | Freq: Once | INTRAVENOUS | Status: AC
  Administered 2019-02-06: 18:00:00 20 mg via INTRAVENOUS

## 2019-02-06 MED ORDER — VECURONIUM BROMIDE 10 MG IV SOLR
INTRAVENOUS | Status: AC
  Administered 2019-02-06: 18:00:00 10 mg
  Filled 2019-02-06: qty 10

## 2019-02-06 MED ORDER — SODIUM CHLORIDE 0.9 % IV BOLUS
1000.0000 mL | Freq: Once | INTRAVENOUS | Status: AC
  Administered 2019-02-06: 1000 mL via INTRAVENOUS

## 2019-02-06 MED ORDER — MIDAZOLAM HCL 2 MG/2ML IJ SOLN
INTRAMUSCULAR | Status: AC
  Administered 2019-02-06: 18:00:00 2 mg via INTRAVENOUS
  Filled 2019-02-06: qty 4

## 2019-02-06 MED ORDER — DOCUSATE SODIUM 50 MG/5ML PO LIQD
100.0000 mg | Freq: Two times a day (BID) | ORAL | Status: DC
  Administered 2019-02-06 – 2019-02-09 (×6): 100 mg
  Filled 2019-02-06 (×6): qty 10

## 2019-02-06 MED ORDER — DOCUSATE SODIUM 50 MG/5ML PO LIQD: 100.0000 mg | Freq: Two times a day (BID) | ORAL | Status: DC | PRN

## 2019-02-06 MED ORDER — VECURONIUM BROMIDE 10 MG IV SOLR
INTRAVENOUS | Status: AC
  Filled 2019-02-06: qty 10

## 2019-02-06 NOTE — Progress Notes (Addendum)
[name].  VHQ:[id number] DOB: June 30, 1931 DOA: 01/19/2019 PCP: Jearld Fenton, NP    Brief Narrative:  85 year old with a history of CAD, chronic atrial fibrillation on Eliquis, chronic diastolic CHF, COPD, CVA x2, and DM 2, PAD status post BKA June 2020, and obstructive sleep apnea on nightly CPAP who presented to the ED with fever and was initially admitted to [facility] with a diagnosis of pneumonia.  When his Covid test returned positive he was transferred to [facility].  On 1021 he experienced an acute decompensation in which he exhibited marked increased work of breathing with tachypnea and altered mental status.  He was transferred to the ICU and intubated.  Significant Events: 10/15 admit to [facility] 10/17 transfer to [facility] 10/21 transfer to ICU - intubated 10/22 extubated - trial of nightly BIPAP 10/23 trial of nightly HHFNC as BIPAP substitute 10/24 transfer to PCU -acute decompensation requiring transfer back to ICU and reinitiation of BiPAP  COVID-19 specific Treatment: Remdesivir 10/16 > 10/20 Decadron 10/16 Solu-Medrol 10/17 >  Subjective: The patient is again experiencing significant respiratory distress this morning with tachypnea and increased work of breathing.  He reportedly did not sleep well last night and did not tolerate BiPAP/use it consistently.  Though he is somnolent he is able to wake up at the time of my exam and tell me that he feels okay.  He denies chest pain nausea vomiting or abdominal pain.  Assessment & Plan:  Acute on chronic hypoxic respiratory failure - Covid pneumonia has completed 5-day course of remdesivir - to extend steroid course and taper given wheezing concerns - no evidence of worsening viral pneumonitis  Recurring episodes of acute respiratory failure Exact etiology of his acute recurrent decline decline (10/21, 10/24, today) unclear -appears dramatically improved and stable yesterday - hx suggestive of possible  microaspiration leading to VCD - cont to follow in ICU for now - cont nightly BIPAP for now -awaiting MBS when patient stabilizes -exercise caution and not jumping to intubation due too early  BiPAP dependent obstructive sleep apnea Perhaps his acute decline was related to inability to use BiPAP at [facility] each night, though a lack of severe hypercarbia argues against this - cont QHS BIPAP in ICU for now   COPD with chronic hypoxic respiratory failure Reportedly requires home oxygen prn at 2 L/min - wean O2 during day as able - wean steroids to off   Acute onChronic diastolic CHF EF 41% on TTE 2016 - appears euvolemic on exam - monitor ins and outs -is not on chronic diuretic therapy -net negative approximately 11 L since admission  Mild hypernatremia Resolved  CAD s/p CABG Continue beta-blocker, statin, and Zetia - asymptomatic   Chronic atrial fibrillation with RVR CHA2DS2-VASc is 3 - chronic beta-blocker continues - cont Eliquis - HR trending upward today and likely being driven by respiratory difficulty as well as inability to consistently take oral medications -transition to scheduled IV beta-blocker for now  Acute kidney injury on mild CKD baseline creatinine 1.2 - creatinine at baseline  Hypertension Blood pressure trending upward in absence of oral medications -transition to IV beta-blocker for now  Dyslipidemia Continue statin and Zetia when able to take oral medications  Recent left BKA L BKA and right heel ulcers noted at time of presentation to [facility] - continue wound care - continue PT/OT - may require a rehab stay but is highly motivated to go home  Hypothyroidism Continue usual Synthroid dose -TSH stable  DM2  CBG quite variable -follow without change for now  DVT prophylaxis: Eliquis Code Status: FULL CODE Family Communication: Spoke with son via telephone -informed son patient had to be reintubated around 23 today -broached the subject of  transitioning to comfort measures and son is open to this if we do not see significant improvement with this episode of mechanical support Disposition Plan: Keep in ICU due to recurrent episodes of acute idiopathic respiratory distress   Consultants:  PCCM  Antimicrobials:  10/15 cefepime > 10/17 10/15 flagyl > 10/16 10/15 zosyn  10/15 vancomycin  Objective: Blood pressure (!) 158/91, pulse 100, temperature (!) 97 F (36.1 C), temperature source Axillary, resp. rate (!) 35, height 5\' 6"  (1.676 m), weight 80 kg, SpO2 100 %.  Intake/Output Summary (Last 24 hours) at 02/06/2019 1425 Last data filed at 02/06/2019 1200 Gross per 24 hour  Intake -  Output 2075 ml  Net -2075 ml   Filed Weights   01/30/2019 1413  Weight: 80 kg    Examination: General: Sedate in acute respiratory distress Lungs: No wheezing -good air movement throughout all fields Cardiovascular: Irregularly irregular and tachycardic at approximately 120 Abdomen: Overweight, NT/ND, soft, BS+ Extremities: no signif edema - left BKA wound dressed/dry   CBC: Recent Labs  Lab 01/31/19 0500  02/03/19 0445  02/04/19 0530 02/05/19 0543 02/06/19 1315  WBC 12.6*   < > 10.8*  --  10.0 10.2  --   NEUTROABS 10.3*  --   --   --   --   --   --   HGB 14.5   < > 13.4   < > 13.5 13.5 15.0  HCT 45.5   < > 44.2   < > 42.6 43.7 44.0  MCV 87.8   < > 92.1  --  91.2 90.3  --   PLT 228   < > 197  --  190 175  --    < > = values in this interval not displayed.   Basic Metabolic Panel: Recent Labs  Lab 01/31/19 0500  02/03/19 0445  02/04/19 0530 02/05/19 0543 02/06/19 1315  NA 145   < > 142   < > 145 139 143  K 3.7   < > 4.3   < > 4.1 4.4 3.9  CL 105   < > 106  --  109 103  --   CO2 29   < > 27  --  26 26  --   GLUCOSE 113*   < > 256*  --  188* 243*  --   BUN 40*   < > 48*  --  44* 39*  --   CREATININE 0.95   < > 1.22  --  1.22 1.11  --   CALCIUM 9.1   < > 8.5*  --  8.7* 8.8*  --   MG 2.3  --   --   --   --   --   --     < > = values in this interval not displayed.   GFR: Estimated Creatinine Clearance: 48.4 mL/min (by C-G formula based on SCr of 1.11 mg/dL).  Liver Function Tests: Recent Labs  Lab 02/01/19 0515 02/02/19 0550 02/03/19 0445 02/04/19 0530  AST 19 13* 13* 10*  ALT 16 15 15 16   ALKPHOS 50 51 54 52  BILITOT 0.9 0.9 1.1 1.1  PROT 5.2* 5.3* 5.3* 5.2*  ALBUMIN 2.4* 2.5* 2.7* 2.5*    HbA1C: Hemoglobin A1C  Date/Time Value Ref Range  Status  04/23/2014 04:16 AM 12.1 (H) 4.2 - 6.3 % Final    Comment:    The American Diabetes Association recommends that a primary goal of therapy should be <7% and that physicians should reevaluate the treatment regimen in patients with HbA1c values consistently >8%.    Hgb A1c MFr Bld  Date/Time Value Ref Range Status  01/26/2019 03:58 AM 6.7 (H) 4.8 - 5.6 % Final    Comment:    (NOTE) Pre diabetes:          5.7%-6.4% Diabetes:              >6.4% Glycemic control for   <7.0% adults with diabetes   09/11/2018 03:05 AM 7.4 (H) 4.8 - 5.6 % Final    Comment:    (NOTE) Pre diabetes:          5.7%-6.4% Diabetes:              >6.4% Glycemic control for   <7.0% adults with diabetes     CBG: Recent Labs  Lab 02/05/19 1249 02/05/19 1644 02/05/19 2114 02/06/19 0827 02/06/19 1157  GLUCAP 285* 176* 187* 190* 229*    Recent Results (from the past 240 hour(s))  MRSA PCR Screening     Status: None   Collection Time: 02/04/19  5:55 AM   Specimen: Nasopharyngeal  Result Value Ref Range Status   MRSA by PCR NEGATIVE NEGATIVE Final    Comment:        The GeneXpert MRSA Assay (FDA approved for NASAL specimens only), is one component of a comprehensive MRSA colonization surveillance program. It is not intended to diagnose MRSA infection nor to guide or monitor treatment for MRSA infections. Performed at [facility], [place] [address], [place], [place] [address]      Scheduled Meds: . apixaban  5 mg Oral BID  .  Chlorhexidine Gluconate Cloth  6 each Topical Daily  . citalopram  10 mg Oral Daily  . collagenase   Topical Daily  . docusate sodium  100 mg Oral BID  . dorzolamide  1 drop Both Eyes TID  . finasteride  5 mg Oral Daily  . fluconazole  100 mg Oral Daily  . insulin aspart  0-5 Units Subcutaneous QHS  . insulin aspart  0-9 Units Subcutaneous TID WC  . Ipratropium-Albuterol  1 puff Inhalation Q6H   Or  . ipratropium-albuterol  3 mL Nebulization Q6H  . latanoprost  1 drop Both Eyes QHS  . levothyroxine  50 mcg Oral QAC breakfast  . methylPREDNISolone (SOLU-MEDROL) injection  30 mg Intravenous Q12H  . metoprolol tartrate  10 mg Intravenous Q6H  . montelukast  10 mg Oral QHS  . pantoprazole  40 mg Oral Daily  . sodium chloride flush  3 mL Intravenous Q12H  . tamsulosin  0.4 mg Oral QPC supper  . traZODone  100 mg Oral QHS     LOS: 10 days   Cherene Altes, MD [facility] Office  [phone] Pager - Text Page per Amion  If 7PM-7AM, please contact night-coverage per Amion 02/06/2019, 2:25 PM

## 2019-02-06 NOTE — Procedures (Signed)
Intubation Procedure Note [name] [id number] [birth year]  Procedure: Intubation Indications: Respiratory insufficiency  Procedure Details Consent: Unable to obtain consent because of emergent medical necessity. Time Out: Verified patient identification, verified procedure, site/side was marked, verified correct patient position, special equipment/implants available, medications/allergies/relevent history reviewed, required imaging and test results available.  Performed  Maximum sterile technique was used including antiseptics, cap, gloves, gown, hand hygiene, mask and sheet.  MAC    Evaluation Hemodynamic Status: BP stable throughout; O2 sats: transiently fell during during procedure Patient's Current Condition: stable Complications: No apparent complications Patient did tolerate procedure well. Chest X-ray ordered to verify placement.  CXR: pending.   [name] 02/06/2019

## 2019-02-06 NOTE — Progress Notes (Signed)
SLP Cancellation Note  Patient Details Name: [name]. MRN: [id number] DOB: 09-Sep-1931   Cancelled MBS: Spoke with pt's RN, Barnabas Lister, who reports pt remains lethargic this am with poor reserve.  Not appropriate for MBS.  Will continue to follow for readiness - may need to pursue cortrak if that is aligned with plan of care.                                                                                                       Shaneque Merkle L. Tivis Ringer, MA CCC/SLP Acute Rehabilitation Services Office number [phone]     Juan Quam Laurice 02/06/2019, 7:45 AM

## 2019-02-06 NOTE — Procedures (Signed)
OGT Placement By MD  OGT placed under direct larygnsocopy and verified by auscultation  Rush Farmer, M.D. [facility] Pulmonary/Critical Care Medicine.

## 2019-02-06 NOTE — Procedures (Signed)
Central Venous Catheter Insertion Procedure Note Oswaldo Cueto [id number] 08/21/1931  Procedure: Insertion of Central Venous Catheter Indications: Assessment of intravascular volume, Drug and/or fluid administration and Frequent blood sampling  Procedure Details Consent: Unable to obtain consent because of emergent medical necessity. Time Out: Verified patient identification, verified procedure, site/side was marked, verified correct patient position, special equipment/implants available, medications/allergies/relevent history reviewed, required imaging and test results available.  Performed  Maximum sterile technique was used including antiseptics, cap, gloves, gown, hand hygiene, mask and sheet. Skin prep: Chlorhexidine; local anesthetic administered A antimicrobial bonded/coated triple lumen catheter was placed in the left subclavian vein using the Seldinger technique.  Evaluation Blood flow good Complications: No apparent complications Patient did tolerate procedure well. Chest X-ray ordered to verify placement.  CXR: normal.  [name] 02/06/2019, 5:06 PM

## 2019-02-06 NOTE — Procedures (Signed)
Arterial Catheter Insertion Procedure Note Prathik Aman [id number] September 23, 1931  Procedure: Insertion of Arterial Catheter  Indications: Blood pressure monitoring and Frequent blood sampling  Procedure Details Consent: Unable to obtain consent because of emergent medical necessity. Time Out: Verified patient identification, verified procedure, site/side was marked, verified correct patient position, special equipment/implants available, medications/allergies/relevent history reviewed, required imaging and test results available.  Performed  Maximum sterile technique was used including antiseptics, cap, gloves, gown, hand hygiene, mask and sheet. Skin prep: Chlorhexidine; local anesthetic administered 20 gauge catheter was inserted into right radial artery using the Seldinger technique. ULTRASOUND GUIDANCE USED: NO Evaluation Blood flow good; BP tracing good. Complications: No apparent complications.   [name] 02/06/2019

## 2019-02-06 NOTE — Progress Notes (Signed)
NAME:  [name]., MRN:  [id number], DOB:  [birth year], LOS: 10 ADMISSION DATE:  01/14/2019, CONSULTATION DATE:  10/21 REFERRING MD:  Waldron Labs, CHIEF COMPLAINT:  Dyspnea   Brief History   85 year old male admitted on October 17 for Covid pneumonia, treated with steroids and remdesivir, moved to the intensive care unit on October 21 in the setting of worsening respiratory failure without significant hypoxemia.  Past Medical History  Neuropathy History of nephrolithiasis Hypertension Hyperlipidemia GERD Diabetes mellitus type 2 History of stroke in 2016 and 1999 COPD on as needed oxygen at home Chronic diastolic heart failure, 2423 TTE> LVEF 65% on RV normal, valves OK Chronic atrial fibrillation Coronary artery disease  Significant Hospital Events   10/17 admission, steroids, remdesivir 10/18-20, several days of intermittent lethargy and dyspnea 10/21 dyspnea, worsenign lethargy moved to ICU for intubation 10/22 extubation, BIPAP overnight 10/23 more awake and alert after BIPAP, transitioned to nightly heated high flow to minimize aerosolization risk 10/23 awake and alert, not dyspneic 10/24 moved to PCU, then moved back to ICU that evening for increased work of breathing, wheezing placed on BIPAP with good result  Consults:  PCCM  Procedures:  10/17 ETT > 10/22  Significant Diagnostic Tests:    Micro Data:  10/15 SARS COV 2 > positive 10/16 blood >  10/15 urine >   Antimicrobials/COVID Rx:  10/15 cefepime > 10/17 10/15 flagyl > 10/16 10/15 zosyn  10/16 remdesivir > 10/20 10/15 vancomycin x 1   Interim history/subjective:   Required BiPAP again overnight for increased WOB  Objective   Blood pressure (!) 163/100, pulse (!) 129, temperature 98.9 F (37.2 C), temperature source Oral, resp. rate (!) 31, height 5\' 6"  (1.676 m), weight 80 kg, SpO2 95 %.    Vent Mode: BIPAP FiO2 (%):  [40 %] 40 % Set Rate:  [15 bmp-18 bmp] 15 bmp PEEP:  [5 cmH20] 5  cmH20 Pressure Support:  [15 cmH20] 15 cmH20   Intake/Output Summary (Last 24 hours) at 02/06/2019 0941 Last data filed at 02/06/2019 0600 Gross per 24 hour  Intake -  Output 2100 ml  Net -2100 ml   Filed Weights   01/26/2019 1413  Weight: 80 kg    Examination:  General:  Chronically ill appearing male, NAD, on HFNC, off BiPAP this AM HENT: [place]/AT, PERRL, EOM-I and MMM PULM: Diminished diffusely CV: RRR, Nl S1/S2 and -M/R/G GI: Soft, NT, ND and +BS MSK: normal bulk and tone, s/p  Neuro: awake, alert, no distress, MAEW  I reviewed CXR myself, mild pulmonary edema noted  Discussed with TRH-MD  Resolved Hospital Problem list     Assessment & Plan:  Acute respiratory failure due to increase work of breathing again 10/24> bronchospasm vs vocal cord dysfunction (favor the latter) as these episodes occur after drinking liquids COVID pneumonia  Doubt CHF as net negative SLP evaluation today for possible aspiration BiPAP QHS, do not allow to fail prior to starting BiPAP Will need chronic BiPAP QHS Wean steroids this week: could likely taper off with prednisone over 5-7 days Continue celexa and prn anxiolytics for [facility] management Continue pantoprazole for GERD  D/C HFNC if able Titrate O2 for sat of 85% Will need to discuss code status, hold in the ICU, high risk for intubation  COPD baseline, mild exacerbation? Combivent QID Taper steroids this week   Best practice:  Diet: tube feeding Pain/Anxiety/Delirium protocol (if indicated): n/a VAP protocol (if indicated): n/a DVT prophylaxis: full dose lovenox GI prophylaxis: pantoprazole  daily Glucose control: SSI Mobility: bed rest Code Status: FULL Family Communication: per TRH Disposition: consider LTAC placement  Labs   CBC: Recent Labs  Lab 01/31/19 0500  02/01/19 0515 02/02/19 0550 02/03/19 0445 02/03/19 2003 02/04/19 0353 02/04/19 0530 02/05/19 0543  WBC 12.6*  --  7.7 11.8* 10.8*  --   --  10.0 10.2   NEUTROABS 10.3*  --   --   --   --   --   --   --   --   HGB 14.5   < > 13.8 14.0 13.4 13.9 12.9* 13.5 13.5  HCT 45.5   < > 43.5 45.6 44.2 41.0 38.0* 42.6 43.7  MCV 87.8  --  87.7 91.6 92.1  --   --  91.2 90.3  PLT 228  --  193 218 197  --   --  190 175   < > = values in this interval not displayed.    Basic Metabolic Panel: Recent Labs  Lab 01/31/19 0500  02/01/19 0515 02/02/19 0550 02/03/19 0445 02/03/19 2003 02/04/19 0353 02/04/19 0530 02/05/19 0543  NA 145   < > 149* 151* 142 142 143 145 139  K 3.7   < > 3.6 3.6 4.3 3.8 4.0 4.1 4.4  CL 105  --  110 114* 106  --   --  109 103  CO2 29  --  25 27 27   --   --  26 26  GLUCOSE 113*  --  227* 87 256*  --   --  188* 243*  BUN 40*  --  42* 48* 48*  --   --  44* 39*  CREATININE 0.95  --  1.25* 1.49* 1.22  --   --  1.22 1.11  CALCIUM 9.1  --  8.5* 8.8* 8.5*  --   --  8.7* 8.8*  MG 2.3  --   --   --   --   --   --   --   --    < > = values in this interval not displayed.   GFR: Estimated Creatinine Clearance: 48.4 mL/min (by C-G formula based on SCr of 1.11 mg/dL). Recent Labs  Lab 01/31/19 0500  02/02/19 0550 02/03/19 0445 02/04/19 0530 02/05/19 0543  PROCALCITON <0.10  --   --   --   --   --   WBC 12.6*   < > 11.8* 10.8* 10.0 10.2   < > = values in this interval not displayed.    Liver Function Tests: Recent Labs  Lab 01/31/19 0500 02/01/19 0515 02/02/19 0550 02/03/19 0445 02/04/19 0530  AST 17 19 13* 13* 10*  ALT 18 16 15 15 16   ALKPHOS 55 50 51 54 52  BILITOT 0.9 0.9 0.9 1.1 1.1  PROT 6.3* 5.2* 5.3* 5.3* 5.2*  ALBUMIN 2.9* 2.4* 2.5* 2.7* 2.5*   No results for input(s): LIPASE, AMYLASE in the last 168 hours. No results for input(s): AMMONIA in the last 168 hours.  ABG    Component Value Date/Time   PHART 7.420 02/04/2019 0353   PCO2ART 40.9 02/04/2019 0353   PO2ART 80.0 (L) 02/04/2019 0353   HCO3 26.6 02/04/2019 0353   TCO2 28 02/04/2019 0353   ACIDBASEDEF 1.1 04/17/2018 1314   O2SAT 96.0 02/04/2019  0353     Coagulation Profile: No results for input(s): INR, PROTIME in the last 168 hours.  Cardiac Enzymes: No results for input(s): CKTOTAL, CKMB, CKMBINDEX, TROPONINI in the last 168 hours.  HbA1C: Hemoglobin A1C  Date/Time Value Ref Range Status  04/23/2014 04:16 AM 12.1 (H) 4.2 - 6.3 % Final    Comment:    The American Diabetes Association recommends that a primary goal of therapy should be <7% and that physicians should reevaluate the treatment regimen in patients with HbA1c values consistently >8%.    Hgb A1c MFr Bld  Date/Time Value Ref Range Status  01/26/2019 03:58 AM 6.7 (H) 4.8 - 5.6 % Final    Comment:    (NOTE) Pre diabetes:          5.7%-6.4% Diabetes:              >6.4% Glycemic control for   <7.0% adults with diabetes   09/11/2018 03:05 AM 7.4 (H) 4.8 - 5.6 % Final    Comment:    (NOTE) Pre diabetes:          5.7%-6.4% Diabetes:              >6.4% Glycemic control for   <7.0% adults with diabetes     CBG: Recent Labs  Lab 02/05/19 0824 02/05/19 1249 02/05/19 1644 02/05/19 2114 02/06/19 0827  GLUCAP 257* 285* 176* 187* 190*   The patient is critically ill with multiple organ systems failure and requires high complexity decision making for assessment and support, frequent evaluation and titration of therapies, application of advanced monitoring technologies and extensive interpretation of multiple databases.   Critical Care Time devoted to patient care services described in this note is  32  Minutes. This time reflects time of care of this signee Dr [name]. This critical care time does not reflect procedure time, or teaching time or supervisory time of PA/NP/Med student/Med Resident etc but could involve care discussion time.  Rush Farmer, M.D. [facility] Pulmonary/Critical Care Medicine.

## 2019-02-06 NOTE — Progress Notes (Signed)
Called back in the afternoon.  Patient struggling to breath.  On exam, increased WOB and patient is now unresponsive.  Per Dr. Thereasa Solo the family would want everything done.  Proceeded with intubation.  Post intubation the patient became hypotensive.  Central line was placed and patient was started on levophed and given some fluid back.  F/U CXR reviewed.  ETT and TLC are in a good position and infiltrate noted.  Will follow CVP.  Place a-line for more accurate BP measurement.  F/U ABG and adjust vent accordingly.  PCCM will continue to follow.  The patient is critically ill with multiple organ systems failure and requires high complexity decision making for assessment and support, frequent evaluation and titration of therapies, application of advanced monitoring technologies and extensive interpretation of multiple databases.   Critical Care Time devoted to patient care services described in this note is  90  Minutes. This time reflects time of care of this signee Dr [name]. This critical care time does not reflect procedure time, or teaching time or supervisory time of PA/NP/Med student/Med Resident etc but could involve care discussion time.  Rush Farmer, M.D. [facility] Pulmonary/Critical Care Medicine.

## 2019-02-07 ENCOUNTER — Inpatient Hospital Stay (HOSPITAL_COMMUNITY): Payer: Medicare Other

## 2019-02-07 DIAGNOSIS — I482 Chronic atrial fibrillation, unspecified: Secondary | ICD-10-CM

## 2019-02-07 DIAGNOSIS — Z7189 Other specified counseling: Secondary | ICD-10-CM

## 2019-02-07 DIAGNOSIS — J80 Acute respiratory distress syndrome: Secondary | ICD-10-CM | POA: Diagnosis not present

## 2019-02-07 DIAGNOSIS — U071 COVID-19: Secondary | ICD-10-CM | POA: Diagnosis not present

## 2019-02-07 DIAGNOSIS — J9601 Acute respiratory failure with hypoxia: Secondary | ICD-10-CM | POA: Diagnosis not present

## 2019-02-07 DIAGNOSIS — E118 Type 2 diabetes mellitus with unspecified complications: Secondary | ICD-10-CM

## 2019-02-07 LAB — URINE CULTURE

## 2019-02-07 LAB — POCT I-STAT 7, (LYTES, BLD GAS, ICA,H+H)
Acid-base deficit: 2 mmol/L (ref 0.0–2.0)
Bicarbonate: 22.7 mmol/L (ref 20.0–28.0)
Calcium, Ion: 1.25 mmol/L (ref 1.15–1.40)
HCT: 39 % (ref 39.0–52.0)
Hemoglobin: 13.3 g/dL (ref 13.0–17.0)
O2 Saturation: 93 %
Patient temperature: 97.6
Potassium: 4.1 mmol/L (ref 3.5–5.1)
Sodium: 144 mmol/L (ref 135–145)
TCO2: 24 mmol/L (ref 22–32)
pCO2 arterial: 37.7 mmHg (ref 32.0–48.0)
pH, Arterial: 7.384 (ref 7.350–7.450)
pO2, Arterial: 67 mmHg — ABNORMAL LOW (ref 83.0–108.0)

## 2019-02-07 LAB — CBC
HCT: 44.5 % (ref 39.0–52.0)
Hemoglobin: 13.5 g/dL (ref 13.0–17.0)
MCH: 27.8 pg (ref 26.0–34.0)
MCHC: 30.3 g/dL (ref 30.0–36.0)
MCV: 91.8 fL (ref 80.0–100.0)
Platelets: 201 10*3/uL (ref 150–400)
RBC: 4.85 MIL/uL (ref 4.22–5.81)
RDW: 16.3 % — ABNORMAL HIGH (ref 11.5–15.5)
WBC: 14.8 10*3/uL — ABNORMAL HIGH (ref 4.0–10.5)
nRBC: 0 % (ref 0.0–0.2)

## 2019-02-07 LAB — PHOSPHORUS
Phosphorus: 4.2 mg/dL (ref 2.5–4.6)
Phosphorus: 3.8 mg/dL (ref 2.5–4.6)

## 2019-02-07 LAB — BASIC METABOLIC PANEL
Anion gap: 14 (ref 5–15)
BUN: 51 mg/dL — ABNORMAL HIGH (ref 8–23)
CO2: 23 mmol/L (ref 22–32)
Calcium: 8.6 mg/dL — ABNORMAL LOW (ref 8.9–10.3)
Chloride: 110 mmol/L (ref 98–111)
Creatinine, Ser: 1.31 mg/dL — ABNORMAL HIGH (ref 0.61–1.24)
GFR calc Af Amer: 57 mL/min — ABNORMAL LOW (ref >60)
GFR calc non Af Amer: 49 mL/min — ABNORMAL LOW (ref >60)
Glucose, Bld: 235 mg/dL — ABNORMAL HIGH (ref 70–99)
Potassium: 4.3 mmol/L (ref 3.5–5.1)
Sodium: 147 mmol/L — ABNORMAL HIGH (ref 135–145)

## 2019-02-07 LAB — GLUCOSE, CAPILLARY
Glucose-Capillary: 244 mg/dL — ABNORMAL HIGH (ref 70–99)
Glucose-Capillary: 190 mg/dL — ABNORMAL HIGH (ref 70–99)
Glucose-Capillary: 262 mg/dL — ABNORMAL HIGH (ref 70–99)
Glucose-Capillary: 286 mg/dL — ABNORMAL HIGH (ref 70–99)

## 2019-02-07 LAB — MAGNESIUM
Magnesium: 2.4 mg/dL (ref 1.7–2.4)
Magnesium: 2.4 mg/dL (ref 1.7–2.4)

## 2019-02-07 MED ORDER — VITAL 1.5 CAL PO LIQD
1000.0000 mL | ORAL | Status: DC
  Administered 2019-02-07 – 2019-02-08 (×2): 1000 mL
  Filled 2019-02-07 (×4): qty 1000

## 2019-02-07 MED ORDER — INSULIN ASPART 100 UNIT/ML ~~LOC~~ SOLN
0.0000 [IU] | SUBCUTANEOUS | Status: DC
  Administered 2019-02-07 (×2): 11 [IU] via SUBCUTANEOUS
  Administered 2019-02-08: 7 [IU] via SUBCUTANEOUS
  Administered 2019-02-08: 20 [IU] via SUBCUTANEOUS
  Administered 2019-02-08: 15 [IU] via SUBCUTANEOUS
  Administered 2019-02-08: 09:00:00 11 [IU] via SUBCUTANEOUS
  Administered 2019-02-08 (×2): 7 [IU] via SUBCUTANEOUS
  Administered 2019-02-09: 08:00:00 20 [IU] via SUBCUTANEOUS
  Administered 2019-02-09: 11 [IU] via SUBCUTANEOUS
  Administered 2019-02-09: 15 [IU] via SUBCUTANEOUS
  Administered 2019-02-09: 4 [IU] via SUBCUTANEOUS
  Administered 2019-02-09: 15 [IU] via SUBCUTANEOUS

## 2019-02-07 MED ORDER — FUROSEMIDE 10 MG/ML IJ SOLN
40.0000 mg | Freq: Three times a day (TID) | INTRAMUSCULAR | Status: AC
  Administered 2019-02-07 (×2): 40 mg via INTRAVENOUS
  Filled 2019-02-07 (×2): qty 4

## 2019-02-07 MED ORDER — METOPROLOL TARTRATE 5 MG/5ML IV SOLN
5.0000 mg | Freq: Four times a day (QID) | INTRAVENOUS | Status: DC | PRN
  Administered 2019-02-07 – 2019-02-09 (×2): 5 mg via INTRAVENOUS
  Filled 2019-02-07 (×2): qty 5

## 2019-02-07 MED ORDER — ADULT MULTIVITAMIN W/MINERALS CH
1.0000 | ORAL_TABLET | Freq: Every day | ORAL | Status: DC
  Administered 2019-02-07 – 2019-02-09 (×3): 1
  Filled 2019-02-07 (×3): qty 1

## 2019-02-07 MED ORDER — INSULIN ASPART 100 UNIT/ML ~~LOC~~ SOLN: 0.0000 [IU] | Freq: Three times a day (TID) | SUBCUTANEOUS | Status: DC

## 2019-02-07 MED ORDER — METHYLPREDNISOLONE SODIUM SUCC 40 MG IJ SOLR
40.0000 mg | Freq: Three times a day (TID) | INTRAMUSCULAR | Status: AC
  Administered 2019-02-07 – 2019-02-08 (×3): 40 mg via INTRAVENOUS
  Filled 2019-02-07 (×3): qty 1

## 2019-02-07 MED ORDER — PRO-STAT SUGAR FREE PO LIQD
30.0000 mL | Freq: Four times a day (QID) | ORAL | Status: DC
  Administered 2019-02-07 – 2019-02-09 (×10): 30 mL
  Filled 2019-02-07 (×10): qty 30

## 2019-02-07 NOTE — Progress Notes (Signed)
PT Cancellation Note  Patient Details Name: [name]. MRN: [id number] DOB: Aug 29, 1931   Cancelled Treatment:    Reason Eval/Treat Not Completed: Medical issues which prohibited therapy, patient now on ventilator.   Claretha Cooper 02/07/2019, [place]  Office [phone]

## 2019-02-07 NOTE — Progress Notes (Signed)
Inpatient Diabetes Program Recommendations  AACE/ADA: New Consensus Statement on Inpatient Glycemic Control   Target Ranges:  Prepandial:   less than 140 mg/dL      Peak postprandial:   less than 180 mg/dL (1-2 hours)      Critically ill patients:  140 - 180 mg/dL   Results for RINALDO, MACQUEEN" (MRN [id number]) as of 02/07/2019 13:59  Ref. Range 02/06/2019 08:27 02/06/2019 11:57 02/06/2019 18:58 02/06/2019 21:42 02/07/2019 07:27 02/07/2019 11:44  Glucose-Capillary Latest Ref Range: 70 - 99 mg/dL 190 (H) 229 (H) 169 (H) 159 (H) 244 (H) 190 (H)   Review of Glycemic Control  Current orders for Inpatient glycemic control: Novolog 0-15 units TID with meals, Novolog 0-5 units QHS; Solumedrol 40 mg Q8H, Vital @ 45 ml/hr  Inpatient Diabetes Program Recommendations:   Insulin-Correction: Please consider changing Novolog 0-15 units to Q4H since patient is NPO, on vent, and tube feedings.  Insulin-Tube Feeding Coverage:  Please consider ordering Novolog 3 units Q4H for tube feeding coverage. If tube feeding is stopped or held then Novolog tube feeding coverage should also be stopped or held.  Thanks, Barnie Alderman, RN, MSN, CDE Diabetes Coordinator Inpatient Diabetes Program [phone] (Team Pager from 8am to 5pm)

## 2019-02-07 NOTE — Progress Notes (Signed)
NAME:  Shawn Lyster., MRN:  [id number], DOB:  [birth year], LOS: 11 ADMISSION DATE:  02/10/2019, CONSULTATION DATE:  10/21 REFERRING MD:  Waldron Labs, CHIEF COMPLAINT:  Dyspnea   Brief History   85 year old male admitted on October 17 for Covid pneumonia, treated with steroids and remdesivir, moved to the intensive care unit on October 21 in the setting of worsening respiratory failure without significant hypoxemia.  Past Medical History  Neuropathy History of nephrolithiasis Hypertension Hyperlipidemia GERD Diabetes mellitus type 2 History of stroke in 2016 and 1999 COPD on as needed oxygen at home Chronic diastolic heart failure, 9357 TTE> LVEF 65% on RV normal, valves OK Chronic atrial fibrillation Coronary artery disease  Significant Hospital Events   10/17 admission, steroids, remdesivir 10/18-20, several days of intermittent lethargy and dyspnea 10/21 dyspnea, worsenign lethargy moved to ICU for intubation 10/22 extubation, BIPAP overnight 10/23 more awake and alert after BIPAP, transitioned to nightly heated high flow to minimize aerosolization risk 10/23 awake and alert, not dyspneic 10/24 moved to PCU, then moved back to ICU that evening for increased work of breathing, wheezing placed on BIPAP with good result  Consults:  PCCM  Procedures:  10/17 ETT > 10/22  Significant Diagnostic Tests:    Micro Data:  10/15 SARS COV 2 > positive 10/16 blood >  10/15 urine >   Antimicrobials/COVID Rx:  10/15 cefepime > 10/17 10/15 flagyl > 10/16 10/15 zosyn  10/16 remdesivir > 10/20 10/15 vancomycin x 1   Interim history/subjective:   Decompensated in the afternoon on 10/28 and required intubation No events overnight, no new complaints  Objective   Blood pressure 110/69, pulse 96, temperature 98.3 F (36.8 C), temperature source Oral, resp. rate 20, height 5\' 6"  (1.676 m), weight 80 kg, SpO2 99 %.    Vent Mode: CPAP FiO2 (%):  [40 %-100 %] 40 % Set Rate:   [15 bmp-20 bmp] 20 bmp Vt Set:  [490 mL-500 mL] 500 mL PEEP:  [5 cmH20] 5 cmH20 Plateau Pressure:  [21 cmH20-26 cmH20] 21 cmH20   Intake/Output Summary (Last 24 hours) at 02/07/2019 0177 Last data filed at 02/07/2019 0607 Gross per 24 hour  Intake 1462.08 ml  Output 2075 ml  Net -612.92 ml   Filed Weights   01/13/2019 1413  Weight: 80 kg   Examination:  General:  Chronically ill appearing male, sedated and intubated HENT: [place]/AT, PERRL, EOM-I and MMM, ETT in place PULM: Coarse diffusely CV: RRR, nl S1/S2 and -M/R/G GI: Soft, NT, ND and +BS MSK: normal bulk and tone, s/p  Neuro: Sedate but withdraws to pain  I reviewed CXR myself, ETT is in a good position, infiltrate and edema noted  Discussed with TRH-MD  Resolved Hospital Problem list     Assessment & Plan:  Acute respiratory failure due to increase work of breathing again 10/24> bronchospasm vs vocal cord dysfunction (favor the latter) as these episodes occur after drinking liquids COVID pneumonia  Doubt CHF as net negative Maintain intubated for now Low dose diureses D/C BiPAP Titrate O2 for sat of 85-92% Adjust PEEP and FiO2 accordingly Continue steroids for now solumedrol 40 q8 Continue celexa and prn anxiolytics for [facility] management Continue pantoprazole for GERD  D/C HFNC if able Titrate O2 for sat of 85% Need to discuss code status and if tracheostomy is an option  COPD baseline, mild exacerbation? Combivent QID Increase steroids to 40 q8  Spoke with the son over the phone, informed him of concerns over recurrent intubation and  that the options are optimization then one way extubation if successful, trach/peg ..etc.  After discussion, patient has previously stated no trach/peg/SNF and that option was removed off the table.  In case of arrest will not perform CPR or cardioversion either.  The son is hoping that we can optimize the patient and extubate him to allow for some time with the family but if that is  unsuccessful over the next 24-48 hours then will proceed with comfort measures.  Will change code status to no CPR/cardioversion/trach/peg.  PCCM will continue to follow  Best practice:  Diet: tube feeding Pain/Anxiety/Delirium protocol (if indicated): n/a VAP protocol (if indicated): n/a DVT prophylaxis: full dose lovenox GI prophylaxis: pantoprazole daily Glucose control: SSI Mobility: bed rest Code Status: FULL Family Communication: per [facility] Disposition: consider LTAC placement  Labs   CBC: Recent Labs  Lab 02/02/19 0550 02/03/19 0445  02/04/19 0530 02/05/19 0543 02/06/19 1315 02/06/19 1808 02/07/19 0430 02/07/19 0525  WBC 11.8* 10.8*  --  10.0 10.2  --   --  14.8*  --   HGB 14.0 13.4   < > 13.5 13.5 15.0 13.6 13.5 13.3  HCT 45.6 44.2   < > 42.6 43.7 44.0 40.0 44.5 39.0  MCV 91.6 92.1  --  91.2 90.3  --   --  91.8  --   PLT 218 197  --  190 175  --   --  201  --    < > = values in this interval not displayed.    Basic Metabolic Panel: Recent Labs  Lab 02/02/19 0550 02/03/19 0445  02/04/19 0530 02/05/19 0543 02/06/19 1315 02/06/19 1808 02/07/19 0430 02/07/19 0525  NA 151* 142   < > 145 139 143 144 147* 144  K 3.6 4.3   < > 4.1 4.4 3.9 4.2 4.3 4.1  CL 114* 106  --  109 103  --   --  110  --   CO2 27 27  --  26 26  --   --  23  --   GLUCOSE 87 256*  --  188* 243*  --   --  235*  --   BUN 48* 48*  --  44* 39*  --   --  51*  --   CREATININE 1.49* 1.22  --  1.22 1.11  --   --  1.31*  --   CALCIUM 8.8* 8.5*  --  8.7* 8.8*  --   --  8.6*  --   MG  --   --   --   --   --   --   --  2.4  --   PHOS  --   --   --   --   --   --   --  4.2  --    < > = values in this interval not displayed.   GFR: Estimated Creatinine Clearance: 41 mL/min (A) (by C-G formula based on SCr of 1.31 mg/dL (H)). Recent Labs  Lab 02/03/19 0445 02/04/19 0530 02/05/19 0543 02/07/19 0430  WBC 10.8* 10.0 10.2 14.8*    Liver Function Tests: Recent Labs  Lab 02/01/19 0515 02/02/19  0550 02/03/19 0445 02/04/19 0530  AST 19 13* 13* 10*  ALT 16 15 15 16   ALKPHOS 50 51 54 52  BILITOT 0.9 0.9 1.1 1.1  PROT 5.2* 5.3* 5.3* 5.2*  ALBUMIN 2.4* 2.5* 2.7* 2.5*   No results for input(s): LIPASE, AMYLASE in the last 168 hours. No  results for input(s): AMMONIA in the last 168 hours.  ABG    Component Value Date/Time   PHART 7.384 02/07/2019 0525   PCO2ART 37.7 02/07/2019 0525   PO2ART 67.0 (L) 02/07/2019 0525   HCO3 22.7 02/07/2019 0525   TCO2 24 02/07/2019 0525   ACIDBASEDEF 2.0 02/07/2019 0525   O2SAT 93.0 02/07/2019 0525     Coagulation Profile: No results for input(s): INR, PROTIME in the last 168 hours.  Cardiac Enzymes: No results for input(s): CKTOTAL, CKMB, CKMBINDEX, TROPONINI in the last 168 hours.  HbA1C: Hemoglobin A1C  Date/Time Value Ref Range Status  04/23/2014 04:16 AM 12.1 (H) 4.2 - 6.3 % Final    Comment:    The American Diabetes Association recommends that a primary goal of therapy should be <7% and that physicians should reevaluate the treatment regimen in patients with HbA1c values consistently >8%.    Hgb A1c MFr Bld  Date/Time Value Ref Range Status  01/26/2019 03:58 AM 6.7 (H) 4.8 - 5.6 % Final    Comment:    (NOTE) Pre diabetes:          5.7%-6.4% Diabetes:              >6.4% Glycemic control for   <7.0% adults with diabetes   09/11/2018 03:05 AM 7.4 (H) 4.8 - 5.6 % Final    Comment:    (NOTE) Pre diabetes:          5.7%-6.4% Diabetes:              >6.4% Glycemic control for   <7.0% adults with diabetes     CBG: Recent Labs  Lab 02/06/19 0827 02/06/19 1157 02/06/19 1858 02/06/19 2142 02/07/19 0727  GLUCAP 190* 229* 169* 159* 244*   The patient is critically ill with multiple organ systems failure and requires high complexity decision making for assessment and support, frequent evaluation and titration of therapies, application of advanced monitoring technologies and extensive interpretation of multiple  databases.   Critical Care Time devoted to patient care services described in this note is  45  Minutes. This time reflects time of care of this signee Dr [name]. This critical care time does not reflect procedure time, or teaching time or supervisory time of PA/NP/Med student/Med Resident etc but could involve care discussion time.  Rush Farmer, M.D. [facility] Pulmonary/Critical Care Medicine.

## 2019-02-07 NOTE — Progress Notes (Signed)
OT Cancellation Note  Patient Details Name: [name]. MRN: [id number] DOB: 1931-09-09   Cancelled Treatment:    Reason Eval/Treat Not Completed: Medical issues which prohibited therapy. Pt on vent. Will follow for appropriateness.   Dorenda Pfannenstiel,HILLARY 02/07/2019, 8:49 AM  Maurie Boettcher, OT/L   Acute OT Clinical Specialist Acute Rehabilitation Services Pager [phone] Office [phone]

## 2019-02-07 NOTE — Progress Notes (Signed)
Initial Nutrition Assessment   RD working remotely.   DOCUMENTATION CODES:   Not applicable  INTERVENTION:   Tube Feeding:  Vital 1.5 at 45 ml/hr Pro-Stat 30 mL QID Provides 2020 kcals, 133 g of protein and 821 mL of free water Meets 100% estimated calorie and protein needs  Add MVI with Minerals  No weight since admission; order new weight   NUTRITION DIAGNOSIS:   Inadequate oral intake related to acute illness as evidenced by meal completion < 50%, NPO status.  GOAL:   Patient will meet greater than or equal to 90% of their needs  MONITOR:   Vent status, TF tolerance, Labs, Weight trends, Skin  REASON FOR ASSESSMENT:   Ventilator    ASSESSMENT:   85 yo male admitted with acute on chronic respiratory failure with COVID19 pneumonia, acute on chronic CHF. PMH includes CAD, CHF, COPD, CVA x 2, DM, PAD s/p L. BKA in June 2020, OSA   10/15 Admit to [facility] 10/17 Transfer to [facility] 10/21 Intubated 10/22 Extubated 10/27 Intubated  Patient is currently intubated on ventilator support, fentanyl and precedex for sedation, on levophed MV: 11.9 L/min Temp (24hrs), Avg:99.2 F (37.3 C), Min:97.6 F (36.4 C), Max:101.8 F (38.8 C)  Recorded po intake 28% of meals on average  No weight since 10/17. Plan to request new weight  Unable to obtain diet and weight history at this time  Constipated; may need to adjust bowel regimen  Labs: CBGs 159-244 (goal 140-180), Creatinine 1.31, BUN 51 Meds: lasix, ss novolog, solumedrol, mag citrate prn  Diet Order:   Diet Order            Diet NPO time specified  Diet effective now              EDUCATION NEEDS:   Not appropriate for education at this time  Skin:  Skin Assessment: Skin Integrity Issues: Skin Integrity Issues:: Stage I, Unstageable Stage I: sacrum Unstageable: R. heel  Last BM:  10/21  Height:   Ht Readings from Last 1 Encounters:  02/06/19 5\' 6"  (1.676 m)    Weight:   Wt Readings from Last 1  Encounters:  02/10/2019 80 kg    Ideal Body Weight:     BMI:  Body mass index is 28.47 kg/m.  Estimated Nutritional Needs:   Kcal:  2013 kcals  Protein:  120-160 g  Fluid:  >/= 2 L   Cate Nikol Lemar MS, RDN, LDN, CNSC [phone] Pager  [phone] Weekend/On-Call Pager

## 2019-02-07 NOTE — Progress Notes (Signed)
Updated pt family son called all questions answered

## 2019-02-07 NOTE — Progress Notes (Signed)
PROGRESS NOTE  [name]. ZDG:[id number] DOB: 01-21-1932 DOA: 01/16/2019  PCP: Jearld Fenton, NP  Brief History/Interval Summary: 85 year old with a history of CAD, chronic atrial fibrillation on Eliquis, chronic diastolic CHF, COPD, CVA x2, and DM 2, PAD status post BKA June 2020, and obstructive sleep apnea on nightly CPAP who presented to the ED with fever and was initially admitted to [facility] with a diagnosis of pneumonia.  When his Covid test returned positive he was transferred to [facility].  On 1021 he experienced an acute decompensation in which he exhibited marked increased work of breathing with tachypnea and altered mental status.  He was transferred to the ICU and intubated.   Reason for Visit: Acute respiratory failure with hypoxia.  Pneumonia due to COVID-19  Consultants: Pulmonology   Significant Events: 10/15 admit to [facility] 10/17 transfer to [facility] 10/21 transfer to ICU - intubated 10/22 extubated - trial of nightly BIPAP 10/23 trial of nightly HHFNC as BIPAP substitute 10/24 transfer to PCU -acute decompensation requiring transfer back to ICU and reinitiation of BiPAP 10/27: Had to be reintubated   Antibiotics: Anti-infectives (From admission, onward)   Start     Dose/Rate Route Frequency Ordered Stop   02/03/19 1000  fluconazole (DIFLUCAN) tablet 100 mg  Status:  Discontinued     100 mg Oral Daily 02/02/19 1206 02/07/19 1300   02/01/19 1000  fluconazole (DIFLUCAN) IVPB 100 mg  Status:  Discontinued     100 mg 50 mL/hr over 60 Minutes Intravenous Every 24 hours 01/31/19 1438 02/02/19 1206   01/31/19 1500  fluconazole (DIFLUCAN) IVPB 200 mg     200 mg 100 mL/hr over 60 Minutes Intravenous  Once 01/31/19 1438 02/01/19 0700   01/31/19 1400  fluconazole (DIFLUCAN) IVPB 100 mg  Status:  Discontinued     100 mg 50 mL/hr over 60 Minutes Intravenous Every 24 hours 01/31/19 1347 01/31/19 1438   01/29/19 1115  Ampicillin-Sulbactam (UNASYN) 3 g in  sodium chloride 0.9 % 100 mL IVPB     3 g 200 mL/hr over 30 Minutes Intravenous Every 6 hours 01/29/19 1100 02/01/19 0700   01/28/19 1000  remdesivir 100 mg in sodium chloride 0.9 % 250 mL IVPB     100 mg 500 mL/hr over 30 Minutes Intravenous Every 24 hours 01/30/2019 1143 01/30/19 1800   01/12/2019 1300  remdesivir 100 mg in sodium chloride 0.9 % 250 mL IVPB     100 mg 500 mL/hr over 30 Minutes Intravenous Every 24 hours 01/16/2019 1143 01/11/2019 1400      Subjective/Interval History: Patient is intubated and sedated.    Assessment/Plan:  Acute Hypoxic Resp. Failure/Pneumonia due to COVID-19  Vent Mode: PRVC FiO2 (%):  [40 %-100 %] 40 % Set Rate:  [20 bmp] 20 bmp Vt Set:  [490 mL-500 mL] 500 mL PEEP:  [5 cmH20] 5 cmH20 Pressure Support:  [5 cmH20] 5 cmH20 Plateau Pressure:  [21 cmH20-26 cmH20] 25 cmH20     Component Value Date/Time   PHART 7.384 02/07/2019 0525   PCO2ART 37.7 02/07/2019 0525   PO2ART 67.0 (L) 02/07/2019 0525   HCO3 22.7 02/07/2019 0525   TCO2 24 02/07/2019 0525   ACIDBASEDEF 2.0 02/07/2019 0525   O2SAT 93.0 02/07/2019 0525     Lab Results  Component Value Date   SARSCOV2NAA POSITIVE (A) 01/25/2019   [place] NEGATIVE 10/12/2018   [place] NEGATIVE 10/03/2018   [place] NEGATIVE 09/10/2018    From a COVID-19 perspective patient has completed  5-day course of Remdesivir.  He had completed course of steroid as well but steroid were reintroduced due to wheezing.  Patient was extubated a few days ago and was transferred to the floor.  However he had worsening respiratory status over the last 48 hours and had to be reintubated yesterday.  Could have aspirated.  Pulmonology is following and managing.  Prognosis is guarded at this time.  Obstructive sleep apnea He is BiPAP dependent.  He was not on BiPAP at [facility] due to the hospital policies and protocols.  This could have contributed to his decline.  History of COPD with chronic  hypoxic respiratory failure Uses oxygen at home at 2 L/min.  Continue with steroids as he was wheezing.  Acute on chronic diastolic CHF Based on echocardiogram from 2016 his EF is 60%.  He does have edema in his upper extremities.  He is not on chronic diuretic treatment.  Continue to monitor ins and outs and daily weights.  He will be given furosemide today.  Mild hypernatremia Stable.  Continue to monitor.  Chronic atrial fibrillation with occasional RVR Chads 2 vascular score is 3.  He is on chronic beta-blockers.  Beta-blockers being continued intravenously.  He is on apixaban as well.  Monitor heart rate.  Acute kidney injury on chronic kidney disease stage 3 Baseline creatinine around 1.2.  Monitor urine output.  Monitor electrolytes.  Essential hypertension Monitor blood pressures closely.  Dyslipidemia Continue statin when able to take orally.  History of coronary artery disease status post CABG Stable.  Continue beta-blocker.  Recent left BKA Left BKA and right heel ulcers noted at the time of presentation.  Continue wound care.  Hypothyroidism Continue Synthroid.  Diabetes mellitus type 2, uncontrolled with hyperglycemia Elevated CBGs most likely due to steroids.  HbA1c 6.7.  Continue SSI.  Pressure injuries Pressure Injury 01/26/19 Heel Right Unstageable - Full thickness tissue loss in which the base of the ulcer is covered by slough (yellow, tan, gray, green or brown) and/or eschar (tan, brown or black) in the wound bed. (Active)  01/26/19   Location: Heel  Location Orientation: Right  Staging: Unstageable - Full thickness tissue loss in which the base of the ulcer is covered by slough (yellow, tan, gray, green or brown) and/or eschar (tan, brown or black) in the wound bed.  Wound Description (Comments):   Present on Admission: Yes     Pressure Injury 01/24/2019 Sacrum Stage I -  Intact skin with non-blanchable redness of a localized area usually over a bony  prominence. (Active)  01/17/2019 1241  Location: Sacrum  Location Orientation:   Staging: Stage I -  Intact skin with non-blanchable redness of a localized area usually over a bony prominence.  Wound Description (Comments):   Present on Admission: Yes   FEN Not on any fluids at this time.  Initiate tube feedings.  Monitor electrolytes.  DVT Prophylaxis: On Eliquis PUD Prophylaxis: Protonix Code Status: Partial code Family Communication: Family updated by PCCM Disposition Plan: Remain in ICU.   Medications:  Scheduled: . apixaban  5 mg Per Tube BID  . Chlorhexidine Gluconate Cloth  6 each Topical Daily  . citalopram  10 mg Oral Daily  . collagenase   Topical Daily  . docusate  100 mg Per Tube BID  . dorzolamide  1 drop Both Eyes TID  . fentaNYL (SUBLIMAZE) injection  25 mcg Intravenous Once  . finasteride  5 mg Oral Daily  . furosemide  40 mg Intravenous  Q8H  . insulin aspart  0-15 Units Subcutaneous TID WC  . insulin aspart  0-5 Units Subcutaneous QHS  . Ipratropium-Albuterol  1 puff Inhalation Q6H   Or  . ipratropium-albuterol  3 mL Nebulization Q6H  . latanoprost  1 drop Both Eyes QHS  . levothyroxine  50 mcg Oral QAC breakfast  . methylPREDNISolone (SOLU-MEDROL) injection  40 mg Intravenous Q8H  . metoprolol tartrate  10 mg Intravenous Q6H  . montelukast  10 mg Oral QHS  . pantoprazole sodium  40 mg Per Tube Daily  . sodium chloride flush  3 mL Intravenous Q12H  . tamsulosin  0.4 mg Oral QPC supper  . traZODone  100 mg Oral QHS  . vecuronium  10 mg Intravenous Once   Continuous: . dexmedetomidine (PRECEDEX) IV infusion 0.1 mcg/kg/hr (02/07/19 0841)  . fentaNYL infusion INTRAVENOUS 15 mcg/hr (02/07/19 1152)  . norepinephrine (LEVOPHED) Adult infusion Stopped (02/07/19 1151)   WGY:[id number], albuterol, docusate, fentaNYL, LORazepam, magnesium citrate, nitroGLYCERIN, [DISCONTINUED] ondansetron **OR** ondansetron (ZOFRAN) IV   Objective:  Vital Signs  Vitals:    02/07/19 0700 02/07/19 0730 02/07/19 0828 02/07/19 1210  BP: 113/67 110/69 138/65 133/64  Pulse: 99 96 96 (!) 112  Resp: 20 20 (!) 27 (!) 27  Temp: 98.3 F (36.8 C)     TempSrc: Oral     SpO2: 99% 99% 96% 97%  Weight:      Height:        Intake/Output Summary (Last 24 hours) at 02/07/2019 1330 Last data filed at 02/07/2019 0607 Gross per 24 hour  Intake 1462.08 ml  Output 1425 ml  Net 37.08 ml   Filed Weights   02/02/2019 1413  Weight: 80 kg    General appearance: Intubated and sedated Resp: Coarse breath sound bilaterally.  Crackles at the bases.  Occasional wheezing.  No rhonchi. Cardio: S1-S2 is irregularly irregular.  No S3-S4. GI: Abdomen is soft.  Nontender nondistended.  Bowel sounds are present normal.  No masses organomegaly Extremities: Left BKA.  Edema noted bilateral upper extremities.  Left upper extremity is cold to touch.  Good pulses present. Neurologic: Intubated and sedated   Lab Results:  Data Reviewed: I have personally reviewed following labs and imaging studies  CBC: Recent Labs  Lab 02/02/19 0550 02/03/19 0445  02/04/19 0530 02/05/19 0543 02/06/19 1315 02/06/19 1808 02/07/19 0430 02/07/19 0525  WBC 11.8* 10.8*  --  10.0 10.2  --   --  14.8*  --   HGB 14.0 13.4   < > 13.5 13.5 15.0 13.6 13.5 13.3  HCT 45.6 44.2   < > 42.6 43.7 44.0 40.0 44.5 39.0  MCV 91.6 92.1  --  91.2 90.3  --   --  91.8  --   PLT 218 197  --  190 175  --   --  201  --    < > = values in this interval not displayed.    Basic Metabolic Panel: Recent Labs  Lab 02/02/19 0550 02/03/19 0445  02/04/19 0530 02/05/19 0543 02/06/19 1315 02/06/19 1808 02/07/19 0430 02/07/19 0525  NA 151* 142   < > 145 139 143 144 147* 144  K 3.6 4.3   < > 4.1 4.4 3.9 4.2 4.3 4.1  CL 114* 106  --  109 103  --   --  110  --   CO2 27 27  --  26 26  --   --  23  --   GLUCOSE 87 256*  --  188* 243*  --   --  235*  --   BUN 48* 48*  --  44* 39*  --   --  51*  --   CREATININE 1.49* 1.22   --  1.22 1.11  --   --  1.31*  --   CALCIUM 8.8* 8.5*  --  8.7* 8.8*  --   --  8.6*  --   MG  --   --   --   --   --   --   --  2.4  --   PHOS  --   --   --   --   --   --   --  4.2  --    < > = values in this interval not displayed.    GFR: Estimated Creatinine Clearance: 41 mL/min (A) (by C-G formula based on SCr of 1.31 mg/dL (H)).  Liver Function Tests: Recent Labs  Lab 02/01/19 0515 02/02/19 0550 02/03/19 0445 02/04/19 0530  AST 19 13* 13* 10*  ALT 16 15 15 16   ALKPHOS 50 51 54 52  BILITOT 0.9 0.9 1.1 1.1  PROT 5.2* 5.3* 5.3* 5.2*  ALBUMIN 2.4* 2.5* 2.7* 2.5*     CBG: Recent Labs  Lab 02/06/19 1157 02/06/19 1858 02/06/19 2142 02/07/19 0727 02/07/19 1144  GLUCAP 229* 169* 159* 244* 190*      Recent Results (from the past 240 hour(s))  MRSA PCR Screening     Status: None   Collection Time: 02/04/19  5:55 AM   Specimen: Nasopharyngeal  Result Value Ref Range Status   MRSA by PCR NEGATIVE NEGATIVE Final    Comment:        The GeneXpert MRSA Assay (FDA approved for NASAL specimens only), is one component of a comprehensive MRSA colonization surveillance program. It is not intended to diagnose MRSA infection nor to guide or monitor treatment for MRSA infections. Performed at [facility], [place] [address], [place], [place] [address]   Culture, blood (routine x 2)     Status: None (Preliminary result)   Collection Time: 02/06/19  7:14 PM   Specimen: BLOOD  Result Value Ref Range Status   Specimen Description   Final    BLOOD Performed at [facility] [address], [place], [place] [address]    Special Requests   Final    BOTTLES DRAWN AEROBIC ONLY Blood Culture adequate volume Performed at [facility] [address] [place], [place] [address]    Culture   Final    NO GROWTH < 12 HOURS Performed at [facility] [address] [place], [place] [address]    Report Status PENDING   Incomplete  Culture, blood (routine x 2)     Status: None (Preliminary result)   Collection Time: 02/06/19  7:19 PM   Specimen: BLOOD  Result Value Ref Range Status   Specimen Description   Final    BLOOD Performed at [facility] [address], [place], [place] [address]    Special Requests   Final    BOTTLES DRAWN AEROBIC ONLY Blood Culture adequate volume Performed at [facility] [address], [place], [place] [address]    Culture   Final    NO GROWTH < 12 HOURS Performed at [facility] [address], [place], [place] [address]    Report Status PENDING  Incomplete      Radiology Studies: Dg  Chest Port 1 View  Result Date: 02/07/2019 CLINICAL DATA:  Pneumonia. EXAM: PORTABLE CHEST 1 VIEW COMPARISON:  February 06, 2019. FINDINGS: Stable cardiomediastinal silhouette. Endotracheal and nasogastric tubes are unchanged in position. No pneumothorax or pleural effusion is noted. Stable bilateral lung opacities are noted most consistent with multifocal pneumonia. Bony thorax is unremarkable. IMPRESSION: Stable bilateral lung opacities are noted consistent with multifocal pneumonia. Stable support apparatus. Electronically Signed   By: Marijo Conception M.D.   On: 02/07/2019 07:48   Dg Chest Port 1 View  Result Date: 02/06/2019 CLINICAL DATA:  Intubation, central line EXAM: PORTABLE CHEST 1 VIEW COMPARISON:  Radiograph 02/05/2019 FINDINGS: Endotracheal tube in the mid trachea, 3 cm from the carina. Transesophageal tube tip and side port distal to the GE junction. Left PICC tip terminates at the superior cavoatrial junction. Extensive and worsening bilateral airspace disease with predominantly increasing perihilar opacity in the right lung. Stable cardiomegaly. No pneumothorax. Suspect small left effusion. No right effusion. No acute osseous or soft tissue abnormality. IMPRESSION: 1. Extensive and worsening bilateral airspace disease with  predominantly increasing perihilar opacity in the right lung. 2. Satisfactory positioning of lines and tubes as above. Electronically Signed   By: Lovena Le M.D.   On: 02/06/2019 17:22       LOS: 11 days   Roxy Filler [employer] on [email or web address]  02/07/2019, 1:30 PM

## 2019-02-08 ENCOUNTER — Inpatient Hospital Stay (HOSPITAL_COMMUNITY): Payer: Medicare Other

## 2019-02-08 DIAGNOSIS — J80 Acute respiratory distress syndrome: Secondary | ICD-10-CM | POA: Diagnosis not present

## 2019-02-08 DIAGNOSIS — U071 COVID-19: Secondary | ICD-10-CM

## 2019-02-08 DIAGNOSIS — J1289 Other viral pneumonia: Secondary | ICD-10-CM | POA: Diagnosis not present

## 2019-02-08 DIAGNOSIS — J1282 Pneumonia due to coronavirus disease 2019: Secondary | ICD-10-CM

## 2019-02-08 DIAGNOSIS — J9601 Acute respiratory failure with hypoxia: Secondary | ICD-10-CM | POA: Diagnosis not present

## 2019-02-08 LAB — BASIC METABOLIC PANEL
Anion gap: 8 (ref 5–15)
BUN: 73 mg/dL — ABNORMAL HIGH (ref 8–23)
CO2: 24 mmol/L (ref 22–32)
Calcium: 9 mg/dL (ref 8.9–10.3)
Chloride: 114 mmol/L — ABNORMAL HIGH (ref 98–111)
Creatinine, Ser: 1.5 mg/dL — ABNORMAL HIGH (ref 0.61–1.24)
GFR calc Af Amer: 49 mL/min — ABNORMAL LOW (ref >60)
GFR calc non Af Amer: 42 mL/min — ABNORMAL LOW (ref >60)
Glucose, Bld: 374 mg/dL — ABNORMAL HIGH (ref 70–99)
Potassium: 4.2 mmol/L (ref 3.5–5.1)
Sodium: 146 mmol/L — ABNORMAL HIGH (ref 135–145)

## 2019-02-08 LAB — CBC
HCT: 45.9 % (ref 39.0–52.0)
Hemoglobin: 13.9 g/dL (ref 13.0–17.0)
MCH: 27.7 pg (ref 26.0–34.0)
MCHC: 30.3 g/dL (ref 30.0–36.0)
MCV: 91.6 fL (ref 80.0–100.0)
Platelets: 158 10*3/uL (ref 150–400)
RBC: 5.01 MIL/uL (ref 4.22–5.81)
RDW: 16.7 % — ABNORMAL HIGH (ref 11.5–15.5)
WBC: 14.1 10*3/uL — ABNORMAL HIGH (ref 4.0–10.5)
nRBC: 0 % (ref 0.0–0.2)

## 2019-02-08 LAB — POCT I-STAT 7, (LYTES, BLD GAS, ICA,H+H)
Acid-base deficit: 1 mmol/L (ref 0.0–2.0)
Bicarbonate: 24.1 mmol/L (ref 20.0–28.0)
Calcium, Ion: 1.33 mmol/L (ref 1.15–1.40)
HCT: 40 % (ref 39.0–52.0)
Hemoglobin: 13.6 g/dL (ref 13.0–17.0)
O2 Saturation: 89 %
Patient temperature: 98.6
Potassium: 4.3 mmol/L (ref 3.5–5.1)
Sodium: 149 mmol/L — ABNORMAL HIGH (ref 135–145)
TCO2: 25 mmol/L (ref 22–32)
pCO2 arterial: 41.1 mmHg (ref 32.0–48.0)
pH, Arterial: 7.377 (ref 7.350–7.450)
pO2, Arterial: 58 mmHg — ABNORMAL LOW (ref 83.0–108.0)

## 2019-02-08 LAB — PHOSPHORUS
Phosphorus: 2 mg/dL — ABNORMAL LOW (ref 2.5–4.6)
Phosphorus: <1 mg/dL — CL (ref 2.5–4.6)

## 2019-02-08 LAB — GLUCOSE, CAPILLARY
Glucose-Capillary: 217 mg/dL — ABNORMAL HIGH (ref 70–99)
Glucose-Capillary: 220 mg/dL — ABNORMAL HIGH (ref 70–99)
Glucose-Capillary: 239 mg/dL — ABNORMAL HIGH (ref 70–99)
Glucose-Capillary: 254 mg/dL — ABNORMAL HIGH (ref 70–99)
Glucose-Capillary: 305 mg/dL — ABNORMAL HIGH (ref 70–99)
Glucose-Capillary: 330 mg/dL — ABNORMAL HIGH (ref 70–99)
Glucose-Capillary: 357 mg/dL — ABNORMAL HIGH (ref 70–99)

## 2019-02-08 LAB — MAGNESIUM
Magnesium: 2.9 mg/dL — ABNORMAL HIGH (ref 1.7–2.4)
Magnesium: 0.9 mg/dL — CL (ref 1.7–2.4)

## 2019-02-08 MED ORDER — SODIUM CHLORIDE 0.9% FLUSH: 10.0000 mL | INTRAVENOUS | Status: DC | PRN

## 2019-02-08 MED ORDER — DIGOXIN 0.25 MG/ML IJ SOLN
0.5000 mg | Freq: Once | INTRAMUSCULAR | Status: AC
  Administered 2019-02-08: 0.5 mg via INTRAVENOUS
  Filled 2019-02-08: qty 2

## 2019-02-08 MED ORDER — MAGNESIUM SULFATE 2 GM/50ML IV SOLN
2.0000 g | Freq: Once | INTRAVENOUS | Status: AC
  Administered 2019-02-08: 2 g via INTRAVENOUS
  Filled 2019-02-08: qty 50

## 2019-02-08 MED ORDER — INSULIN ASPART 100 UNIT/ML ~~LOC~~ SOLN
4.0000 [IU] | SUBCUTANEOUS | Status: DC
  Administered 2019-02-08 – 2019-02-09 (×9): 4 [IU] via SUBCUTANEOUS

## 2019-02-08 MED ORDER — IPRATROPIUM BROMIDE HFA 17 MCG/ACT IN AERS
2.0000 | INHALATION_SPRAY | Freq: Four times a day (QID) | RESPIRATORY_TRACT | Status: DC
  Filled 2019-02-08: qty 12.9

## 2019-02-08 MED ORDER — CHLORHEXIDINE GLUCONATE 0.12% ORAL RINSE (MEDLINE KIT)
15.0000 mL | Freq: Two times a day (BID) | OROMUCOSAL | Status: DC
  Administered 2019-02-08 – 2019-02-09 (×4): 15 mL via OROMUCOSAL

## 2019-02-08 MED ORDER — NALOXONE HCL 0.4 MG/ML IJ SOLN
0.4000 mg | Freq: Once | INTRAMUSCULAR | Status: AC
  Administered 2019-02-08: 0.4 mg via INTRAVENOUS

## 2019-02-08 MED ORDER — SODIUM CHLORIDE 0.9 % IV SOLN
INTRAVENOUS | Status: DC | PRN
  Administered 2019-02-08: 250 mL via INTRAVENOUS

## 2019-02-08 MED ORDER — FUROSEMIDE 10 MG/ML IJ SOLN
40.0000 mg | Freq: Four times a day (QID) | INTRAMUSCULAR | Status: AC
  Administered 2019-02-08 (×3): 40 mg via INTRAVENOUS
  Filled 2019-02-08 (×4): qty 4

## 2019-02-08 MED ORDER — METOLAZONE 10 MG PO TABS
10.0000 mg | ORAL_TABLET | Freq: Once | ORAL | Status: AC
  Administered 2019-02-08: 10 mg via ORAL
  Filled 2019-02-08: qty 1

## 2019-02-08 MED ORDER — FREE WATER
250.0000 mL | Status: DC
  Administered 2019-02-08 – 2019-02-09 (×8): 250 mL

## 2019-02-08 MED ORDER — LEVALBUTEROL TARTRATE 45 MCG/ACT IN AERO
2.0000 | INHALATION_SPRAY | Freq: Four times a day (QID) | RESPIRATORY_TRACT | Status: DC
  Filled 2019-02-08: qty 15

## 2019-02-08 MED ORDER — ORAL CARE MOUTH RINSE
15.0000 mL | OROMUCOSAL | Status: DC
  Administered 2019-02-08 – 2019-02-09 (×18): 15 mL via OROMUCOSAL

## 2019-02-08 MED ORDER — POTASSIUM PHOSPHATES 15 MMOLE/5ML IV SOLN
30.0000 mmol | Freq: Once | INTRAVENOUS | Status: AC
  Administered 2019-02-08: 30 mmol via INTRAVENOUS
  Filled 2019-02-08: qty 10

## 2019-02-08 MED ORDER — NALOXONE HCL 0.4 MG/ML IJ SOLN
INTRAMUSCULAR | Status: AC
  Filled 2019-02-08: qty 1

## 2019-02-08 MED ORDER — SODIUM CHLORIDE 0.9% FLUSH: 0.0000 mL | INTRAVENOUS | Status: DC | PRN

## 2019-02-08 MED ORDER — METOPROLOL TARTRATE 25 MG PO TABS
50.0000 mg | ORAL_TABLET | Freq: Two times a day (BID) | ORAL | Status: DC
  Administered 2019-02-08 – 2019-02-09 (×3): 50 mg
  Filled 2019-02-08 (×3): qty 2

## 2019-02-08 MED ORDER — INSULIN GLARGINE 100 UNIT/ML ~~LOC~~ SOLN
15.0000 [IU] | Freq: Every day | SUBCUTANEOUS | Status: DC
  Administered 2019-02-08: 15 [IU] via SUBCUTANEOUS
  Filled 2019-02-08 (×2): qty 0.15

## 2019-02-08 NOTE — Progress Notes (Signed)
.  Assisted tele visit to patient with family member.  [name], Philis Nettle, RN

## 2019-02-08 NOTE — Progress Notes (Signed)
Pages on-call hospitalist Dr. Olevia Bowens with critical lab results Phos < 1.0 and Mg 0.9

## 2019-02-08 NOTE — Progress Notes (Signed)
PT Cancellation Note  Patient Details Name: [name]. MRN: [id number] DOB: March 13, 1932   Cancelled Treatment:    Reason Eval/Treat Not Completed: Medical issues which prohibited therapy .  PT continuing to follow along.  Pt's HR up today and continues to be on the vent with higher PEEP (8).    Thanks,  Barbarann Ehlers. Clydean Posas, PT, DPT  Acute Rehabilitation [phone] pager [phone] office  @ [facility]: [phone]    Harvie Heck 02/08/2019, 9:28 AM

## 2019-02-08 NOTE — Progress Notes (Signed)
NAME:  [name]., MRN:  [id number], DOB:  04-May-1931, LOS: 12 ADMISSION DATE:  01/25/2019, CONSULTATION DATE:  10/21 REFERRING MD:  [facility], CHIEF COMPLAINT:  Dyspnea   Brief History   85 year old male admitted on October 17 for Covid pneumonia, treated with steroids and remdesivir, moved to the intensive care unit on October 21 in the setting of worsening respiratory failure without significant hypoxemia.  Past Medical History  Neuropathy History of nephrolithiasis Hypertension Hyperlipidemia GERD Diabetes mellitus type 2 History of stroke in 2016 and 1999 COPD on as needed oxygen at home Chronic diastolic heart failure, 8527 TTE> LVEF 65% on RV normal, valves OK Chronic atrial fibrillation Coronary artery disease  Significant Hospital Events   10/17 admission, steroids, remdesivir 10/18-20, several days of intermittent lethargy and dyspnea 10/21 dyspnea, worsenign lethargy moved to ICU for intubation 10/22 extubation, BIPAP overnight 10/23 more awake and alert after BIPAP, transitioned to nightly heated high flow to minimize aerosolization risk 10/23 awake and alert, not dyspneic 10/24 moved to PCU, then moved back to ICU that evening for increased work of breathing, wheezing placed on BIPAP with good result  Consults:  PCCM  Procedures:  10/17 ETT > 10/22  Significant Diagnostic Tests:    Micro Data:  10/15 SARS COV 2 > positive 10/16 blood >  10/15 urine >   Antimicrobials/COVID Rx:  10/15 cefepime > 10/17 10/15 flagyl > 10/16 10/15 zosyn  10/16 remdesivir > 10/20 10/15 vancomycin x 1   Interim history/subjective:   FiO2 to 40 and PEEP 8 Mental status altered overnight  Objective   Blood pressure 134/64, pulse (!) 116, temperature (!) 101.7 F (38.7 C), resp. rate (!) 25, height 5\' 6"  (1.676 m), weight 80 kg, SpO2 91 %. CVP:  [2 mmHg-6 mmHg] 2 mmHg  Vent Mode: PRVC FiO2 (%):  [40 %] 40 % Set Rate:  [20 bmp] 20 bmp Vt Set:  [500 mL] 500  mL PEEP:  [5 cmH20-8 cmH20] 8 cmH20 Plateau Pressure:  [17 cmH20-28 cmH20] 28 cmH20   Intake/Output Summary (Last 24 hours) at 02/08/2019 0911 Last data filed at 02/08/2019 0800 Gross per 24 hour  Intake 673.79 ml  Output 1700 ml  Net -1026.21 ml   Filed Weights   01/12/2019 1413  Weight: 80 kg   Examination:  General:  Chronically ill appearing, NAD HENT: [place]/AT, PERRL, EOM-I and MMM, ETT in place PULM: Coarse BS diffusely CV: RRR, Nl S1/S2 and -M/R/G GI: Soft, NT, ND and +BS MSK: Normal bulk and tone, s/p  Neuro: Sedate but withdraws to pain  I reviewed CXR myself, ETT is in a good position and infiltrate noted  Discussed with TRH-MD  Resolved Hospital Problem list     Assessment & Plan:  Acute respiratory failure due to increase work of breathing again 10/24> bronchospasm vs vocal cord dysfunction (favor the latter) as these episodes occur after drinking liquids COVID pneumonia  Doubt CHF as net negative Maintain intubated for now PS trials but no extubation given mental status Spoke with son yesterday, LCB, no trach/peg, once ready then one way extubation Diureses as ordered Titrate O2 for sat of 85-92% Adjust PEEP and FiO2 accordingly Continue steroids for now solumedrol 40 q8 Continue celexa and prn anxiolytics for [facility] management Continue pantoprazole for GERD  Titrate O2 for sat of 85%  COPD baseline, mild exacerbation? Combivent QID Increased steroids to 40 q8  Updated son, informed him that things are not going terribly well given the long reliance on  BiPAP even before COVID and now with COVID increase in FiO2 demand.  That we would like to continue trying weaning and diureses efforts but that we are concerned about his ability to survive this.  He stated he spoke to the rest of the family and that they are all in agreement.  No trach/peg, short term intubation only.  Will continue plan as above.  PCCM will continue to follow  Best practice:  Diet: tube  feeding Pain/Anxiety/Delirium protocol (if indicated): n/a VAP protocol (if indicated): n/a DVT prophylaxis: full dose lovenox GI prophylaxis: pantoprazole daily Glucose control: SSI Mobility: bed rest Code Status: FULL Family Communication: per TRH Disposition: consider LTAC placement  Labs   CBC: Recent Labs  Lab 02/03/19 0445  02/04/19 0530 02/05/19 0543  02/06/19 1808 02/07/19 0430 02/07/19 0525 02/08/19 0331 02/08/19 0548  WBC 10.8*  --  10.0 10.2  --   --  14.8*  --   --  14.1*  HGB 13.4   < > 13.5 13.5   < > 13.6 13.5 13.3 13.6 13.9  HCT 44.2   < > 42.6 43.7   < > 40.0 44.5 39.0 40.0 45.9  MCV 92.1  --  91.2 90.3  --   --  91.8  --   --  91.6  PLT 197  --  190 175  --   --  201  --   --  158   < > = values in this interval not displayed.    Basic Metabolic Panel: Recent Labs  Lab 02/03/19 0445  02/04/19 0530 02/05/19 0543  02/06/19 1808 02/07/19 0430 02/07/19 0525 02/07/19 1602 02/08/19 0331 02/08/19 0548  NA 142   < > 145 139   < > 144 147* 144  --  149* 146*  K 4.3   < > 4.1 4.4   < > 4.2 4.3 4.1  --  4.3 4.2  CL 106  --  109 103  --   --  110  --   --   --  114*  CO2 27  --  26 26  --   --  23  --   --   --  24  GLUCOSE 256*  --  188* 243*  --   --  235*  --   --   --  374*  BUN 48*  --  44* 39*  --   --  51*  --   --   --  73*  CREATININE 1.22  --  1.22 1.11  --   --  1.31*  --   --   --  1.50*  CALCIUM 8.5*  --  8.7* 8.8*  --   --  8.6*  --   --   --  9.0  MG  --   --   --   --   --   --  2.4  --  2.4  --  2.9*  PHOS  --   --   --   --   --   --  4.2  --  3.8  --  2.0*   < > = values in this interval not displayed.   GFR: Estimated Creatinine Clearance: 35.8 mL/min (A) (by C-G formula based on SCr of 1.5 mg/dL (H)). Recent Labs  Lab 02/04/19 0530 02/05/19 0543 02/07/19 0430 02/08/19 0548  WBC 10.0 10.2 14.8* 14.1*    Liver Function Tests: Recent Labs  Lab 02/02/19 0550 02/03/19 0445 02/04/19 0530  AST 13* 13* 10*  ALT 15 15 16    ALKPHOS 51 54 52  BILITOT 0.9 1.1 1.1  PROT 5.3* 5.3* 5.2*  ALBUMIN 2.5* 2.7* 2.5*   No results for input(s): LIPASE, AMYLASE in the last 168 hours. No results for input(s): AMMONIA in the last 168 hours.  ABG    Component Value Date/Time   PHART 7.377 02/08/2019 0331   PCO2ART 41.1 02/08/2019 0331   PO2ART 58.0 (L) 02/08/2019 0331   HCO3 24.1 02/08/2019 0331   TCO2 25 02/08/2019 0331   ACIDBASEDEF 1.0 02/08/2019 0331   O2SAT 89.0 02/08/2019 0331     Coagulation Profile: No results for input(s): INR, PROTIME in the last 168 hours.  Cardiac Enzymes: No results for input(s): CKTOTAL, CKMB, CKMBINDEX, TROPONINI in the last 168 hours.  HbA1C: Hemoglobin A1C  Date/Time Value Ref Range Status  04/23/2014 04:16 AM 12.1 (H) 4.2 - 6.3 % Final    Comment:    The American Diabetes Association recommends that a primary goal of therapy should be <7% and that physicians should reevaluate the treatment regimen in patients with HbA1c values consistently >8%.    Hgb A1c MFr Bld  Date/Time Value Ref Range Status  01/26/2019 03:58 AM 6.7 (H) 4.8 - 5.6 % Final    Comment:    (NOTE) Pre diabetes:          5.7%-6.4% Diabetes:              >6.4% Glycemic control for   <7.0% adults with diabetes   09/11/2018 03:05 AM 7.4 (H) 4.8 - 5.6 % Final    Comment:    (NOTE) Pre diabetes:          5.7%-6.4% Diabetes:              >6.4% Glycemic control for   <7.0% adults with diabetes     CBG: Recent Labs  Lab 02/07/19 1723 02/07/19 2020 02/07/19 2359 02/08/19 0358 02/08/19 0807  GLUCAP 262* 286* 357* 305* 330*   The patient is critically ill with multiple organ systems failure and requires high complexity decision making for assessment and support, frequent evaluation and titration of therapies, application of advanced monitoring technologies and extensive interpretation of multiple databases.   Critical Care Time devoted to patient care services described in this note is  36   Minutes. This time reflects time of care of this signee Dr [name]. This critical care time does not reflect procedure time, or teaching time or supervisory time of PA/NP/Med student/Med Resident etc but could involve care discussion time.  Rush Farmer, M.D. [facility] Pulmonary/Critical Care Medicine.

## 2019-02-08 NOTE — Progress Notes (Signed)
SLP Cancellation Note  Patient Details Name: [name]. MRN: [id number] DOB: 12/11/1931   Cancelled treatment:   Events of last several days noted; pt reintubated.  SLP will follow along.                                                    Kemper Heupel L. Tivis Ringer, [place] CCC/SLP Acute Rehabilitation Services Office number [phone]                                             Juan Quam Laurice 02/08/2019, 9:07 AM

## 2019-02-08 NOTE — Progress Notes (Signed)
Pt very dyssynchronous on vent.  Per MD, giving Fentanyl 228mcg bolus and increasing dex to 1.2.

## 2019-02-08 NOTE — Significant Event (Signed)
Rapid Response Event Note  Overview: Called to bedside to assess pt for pupillary changes, possible code stroke.  Initial Focused Assessment:  Right pupil 24mm, round, sluggish. Left pupil 32mm, irregular, nonreactive. Fixed forward gaze not overcome by oculocephalic maneuver. Not responsive to painful stimuli. Pt on precedex and fentanyl gtt. Reviewed PMH/PSH/HPI, pt has h/o CVAx2 with L side weakness residual, bilateral cataract surgeries. Is on Eliquis for chronic Afib.  Pupils last charted as equal at 1600, change noted at 2100 (5 hours later).  S/p Narcan pt awakened and started coughing, tachypneic. Moved BUE non-purposefully (the R more than the L). No movement in BLE, even to painful stimuli. Opens eyes to voice, does not follow commands.R pupil now 76mm, round, brisk. L pupil now 45mm, round brisk. Gaze still fixed.   Interventions:  -Stopped precedex and fentanyl gtt -checked CBG (in 200s) -gave 0.4mg  Narcan IV per RR standing orders -MD to bedside to assess patient ~21:30  Plan of Care (if not transferred): Per Dr. Olevia Bowens, will not do CT scan. Will avoid narcotics and use precedex as needed for sedation. Primary RN aware.    Henreitta Leber, RN 02/08/19

## 2019-02-08 NOTE — Progress Notes (Signed)
Updated patient's son Dominica Severin vis phone conversation. All questions and concerns addressed. Appreciative of the update.

## 2019-02-08 NOTE — Progress Notes (Signed)
TRH night shift.  The patient's HR has been in the 120s-140s despite receiving IV metoprolol. He has been normotensive. He is allergic to calcium channel blockers, which precludes safe use of IV diltiazem. Will give magnesium sulfate, switch albuterol to levalbuterol and try a single dose of digoxin 0.5 mg IVP.   Tennis Must, MD

## 2019-02-08 NOTE — Progress Notes (Signed)
PROGRESS NOTE  [name]. WJX:[id number] DOB: Sep 04, 1931 DOA: 01/20/2019  PCP: Jearld Fenton, NP  Brief History/Interval Summary: 85 year old with a history of CAD, chronic atrial fibrillation on Eliquis, chronic diastolic CHF, COPD, CVA x2, and DM 2, PAD status post BKA June 2020, and obstructive sleep apnea on nightly CPAP who presented to the ED with fever and was initially admitted to [facility] with a diagnosis of pneumonia.  When his Covid test returned positive he was transferred to [facility].  On 1021 he experienced an acute decompensation in which he exhibited marked increased work of breathing with tachypnea and altered mental status.  He was transferred to the ICU and intubated.   Reason for Visit: Acute respiratory failure with hypoxia.  Pneumonia due to COVID-19  Consultants: Pulmonology   Significant Events: 10/15 admit to [facility] 10/17 transfer to [facility] 10/21 transfer to ICU - intubated 10/22 extubated - trial of nightly BIPAP 10/23 trial of nightly HHFNC as BIPAP substitute 10/24 transfer to PCU -acute decompensation requiring transfer back to ICU and reinitiation of BiPAP 10/27: Had to be reintubated   Antibiotics: Anti-infectives (From admission, onward)   Start     Dose/Rate Route Frequency Ordered Stop   02/03/19 1000  fluconazole (DIFLUCAN) tablet 100 mg  Status:  Discontinued     100 mg Oral Daily 02/02/19 1206 02/07/19 1300   02/01/19 1000  fluconazole (DIFLUCAN) IVPB 100 mg  Status:  Discontinued     100 mg 50 mL/hr over 60 Minutes Intravenous Every 24 hours 01/31/19 1438 02/02/19 1206   01/31/19 1500  fluconazole (DIFLUCAN) IVPB 200 mg     200 mg 100 mL/hr over 60 Minutes Intravenous  Once 01/31/19 1438 02/01/19 0700   01/31/19 1400  fluconazole (DIFLUCAN) IVPB 100 mg  Status:  Discontinued     100 mg 50 mL/hr over 60 Minutes Intravenous Every 24 hours 01/31/19 1347 01/31/19 1438   01/29/19 1115  Ampicillin-Sulbactam (UNASYN) 3 g in  sodium chloride 0.9 % 100 mL IVPB     3 g 200 mL/hr over 30 Minutes Intravenous Every 6 hours 01/29/19 1100 02/01/19 0700   01/28/19 1000  remdesivir 100 mg in sodium chloride 0.9 % 250 mL IVPB     100 mg 500 mL/hr over 30 Minutes Intravenous Every 24 hours 01/23/2019 1143 01/30/19 1800   02/01/2019 1300  remdesivir 100 mg in sodium chloride 0.9 % 250 mL IVPB     100 mg 500 mL/hr over 30 Minutes Intravenous Every 24 hours 01/25/2019 1143 02/04/2019 1400      Subjective/Interval History: Patient remains intubated and sedated    Assessment/Plan:  Acute Hypoxic Resp. Failure/Pneumonia due to COVID-19  Vent Mode: PRVC FiO2 (%):  [40 %] 40 % Set Rate:  [20 bmp] 20 bmp Vt Set:  [500 mL] 500 mL PEEP:  [5 OZH08-6 cmH20] 8 cmH20 Plateau Pressure:  [17 cmH20-28 cmH20] 28 cmH20     Component Value Date/Time   PHART 7.377 02/08/2019 0331   PCO2ART 41.1 02/08/2019 0331   PO2ART 58.0 (L) 02/08/2019 0331   HCO3 24.1 02/08/2019 0331   TCO2 25 02/08/2019 0331   ACIDBASEDEF 1.0 02/08/2019 0331   O2SAT 89.0 02/08/2019 0331     Lab Results  Component Value Date   [place] (A) 01/25/2019   [place] NEGATIVE 10/12/2018   [place] NEGATIVE 10/03/2018   [place] NEGATIVE 09/10/2018    Pulmonology is following and managing.  From a COVID-19 perspective patient has completed 5-day course  of remdesivir.  He had completed a course of steroid.  But now he is back on steroids due to wheezing that was noted few days ago.  Patient was extubated few days ago but then had to be reintubated for worsening respiratory status.  Patient could have aspirated.  Prognosis is guarded at this time.  Pulmonology to discuss with family regarding further plans.  Consideration for one-way extubation in 24 to 48 hours.  Obstructive sleep apnea He is BiPAP dependent.  He was not on BiPAP at [facility] due to the hospital policies and protocols.  This could have contributed to his decline.   History of COPD with chronic hypoxic respiratory failure Uses oxygen at home at 2 L/min.  Continue with steroids as he was wheezing.  Acute on chronic diastolic CHF Based on echocardiogram from 2016 his EF is 60%.  He does have edema in his upper extremities.  He is not on chronic diuretic treatment.  Continue to monitor ins and outs and daily weights.  Patient being given diuretics daily based on volume status.  Mild hypernatremia Stable.  Continue to monitor.  Chronic atrial fibrillation with occasional RVR Heart rate noted to be poorly controlled overnight.  Patient was given digoxin.  Heart rate still poorly controlled.  We will place him on metoprolol through the tube twice a day which will hopefully give him a sustained beta-blockade.  He is also on as needed metoprolol.  Could consider further doses of digoxin as well.  Patient's chads 2 vascular score is 3.  He is on apixaban.    Acute kidney injury on chronic kidney disease stage 3 Baseline creatinine around 1.2.  Rise in BUN and creatinine most likely due to diuretics.  Monitor urine output.    Essential hypertension Reasonably well-controlled.  Continue to monitor.  Dyslipidemia Continue statin when able to take orally.  History of coronary artery disease status post CABG Stable.  Continue beta-blocker.  Recent left BKA Left BKA and right heel ulcers noted at the time of presentation.  Continue wound care.  Hypothyroidism Continue Synthroid.  Diabetes mellitus type 2, uncontrolled with hyperglycemia CBGs poorly controlled.  HbA1c 6.7.  He is already on SSI.  Will add Lantus insulin.  Pressure injuries Pressure Injury 01/26/19 Heel Right Unstageable - Full thickness tissue loss in which the base of the ulcer is covered by slough (yellow, tan, gray, green or brown) and/or eschar (tan, brown or black) in the wound bed. (Active)  01/26/19   Location: Heel  Location Orientation: Right  Staging: Unstageable - Full  thickness tissue loss in which the base of the ulcer is covered by slough (yellow, tan, gray, green or brown) and/or eschar (tan, brown or black) in the wound bed.  Wound Description (Comments):   Present on Admission: Yes     Pressure Injury 02/03/2019 Sacrum Stage I -  Intact skin with non-blanchable redness of a localized area usually over a bony prominence. (Active)  01/26/2019 1241  Location: Sacrum  Location Orientation:   Staging: Stage I -  Intact skin with non-blanchable redness of a localized area usually over a bony prominence.  Wound Description (Comments):   Present on Admission: Yes   FEN Not on any fluids at this time.  Initiate tube feedings.  Monitor electrolytes.  DVT Prophylaxis: On Eliquis PUD Prophylaxis: Protonix Code Status: Partial code Family Communication: Family updated by PCCM Disposition Plan: Remain in ICU.   Medications:  Scheduled: . apixaban  5 mg Per Tube  BID  . chlorhexidine gluconate (MEDLINE KIT)  15 mL Mouth Rinse BID  . Chlorhexidine Gluconate Cloth  6 each Topical Daily  . citalopram  10 mg Oral Daily  . collagenase   Topical Daily  . docusate  100 mg Per Tube BID  . dorzolamide  1 drop Both Eyes TID  . feeding supplement (PRO-STAT SUGAR FREE 64)  30 mL Per Tube QID  . fentaNYL (SUBLIMAZE) injection  25 mcg Intravenous Once  . finasteride  5 mg Oral Daily  . free water  250 mL Per Tube Q4H  . furosemide  40 mg Intravenous Q6H  . insulin aspart  0-20 Units Subcutaneous Q4H  . insulin aspart  4 Units Subcutaneous Q4H  . insulin glargine  15 Units Subcutaneous Daily  . latanoprost  1 drop Both Eyes QHS  . levothyroxine  50 mcg Oral QAC breakfast  . mouth rinse  15 mL Mouth Rinse 10 times per day  . metolazone  10 mg Oral Once  . metoprolol tartrate  50 mg Per Tube BID  . montelukast  10 mg Oral QHS  . multivitamin with minerals  1 tablet Per Tube Daily  . pantoprazole sodium  40 mg Per Tube Daily  . sodium chloride flush  3 mL  Intravenous Q12H  . tamsulosin  0.4 mg Oral QPC supper  . traZODone  100 mg Oral QHS  . vecuronium  10 mg Intravenous Once   Continuous: . sodium chloride 10 mL/hr at 02/08/19 0400  . dexmedetomidine (PRECEDEX) IV infusion 0.4 mcg/kg/hr (02/08/19 0912)  . feeding supplement (VITAL 1.5 CAL) 1,000 mL (02/08/19 0216)  . fentaNYL infusion INTRAVENOUS 125 mcg/hr (02/08/19 0935)  . norepinephrine (LEVOPHED) Adult infusion 16 mcg/min (02/08/19 1211)   YTR:ZNBVAP chloride, acetaminophen, docusate, fentaNYL, LORazepam, magnesium citrate, metoprolol tartrate, nitroGLYCERIN, [DISCONTINUED] ondansetron **OR** ondansetron (ZOFRAN) IV, sodium chloride flush   Objective:  Vital Signs  Vitals:   02/08/19 0900 02/08/19 1000 02/08/19 1100 02/08/19 1200  BP: (!) 142/111 (!) 77/62 90/64 (!) 79/56  Pulse: (!) 138 (!) 114 (!) 101 90  Resp: (!) 38 _0 Temp:    (!) 100.8 F (38.2 C)  TempSrc:      SpO2:  98% 96% 93%  Weight:      Height:        Intake/Output Summary (Last 24 hours) at 02/08/2019 1249 Last data filed at 02/08/2019 0800 Gross per 24 hour  Intake 673.79 ml  Output 2000 ml  Net -1326.21 ml   Filed Weights   01/14/2019 1413  Weight: 80 kg     General appearance: Intubated and sedated Resp: Coarse breath sounds bilaterally.  Crackles at the bases.  Occasional wheezing.  No rhonchi. Cardio: S1-S2 is irregularly irregular.  No S3-S4. GI: Abdomen is soft.  Nontender nondistended.  Bowel sounds are present normal.  No masses organomegaly Extremities: Left BKA.  Edema noted bilateral upper extremities.  Left upper extremity is cold to touch.  Good pulses present. Neurologic: Intubated and sedated    Lab Results:  Data Reviewed: I have personally reviewed following labs and imaging studies  CBC: Recent Labs  Lab 02/03/19 0445  02/04/19 0530 02/05/19 0543  02/06/19 1808 02/07/19 0430 02/07/19 0525 02/08/19 0331 02/08/19 0548  WBC 10.8*  --  10.0 10.2  --   --   14.8*  --   --  14.1*  HGB 13.4   < > 13.5 13.5   < > 13.6 13.5 13.3 13.6 13.9  HCT 44.2   < > 42.6 43.7   < > 40.0 44.5 39.0 40.0 45.9  MCV 92.1  --  91.2 90.3  --   --  91.8  --   --  91.6  PLT 197  --  190 175  --   --  201  --   --  158   < > = values in this interval not displayed.    Basic Metabolic Panel: Recent Labs  Lab 02/03/19 0445  02/04/19 0530 02/05/19 0543  02/06/19 1808 02/07/19 0430 02/07/19 0525 02/07/19 1602 02/08/19 0331 02/08/19 0548  NA 142   < > 145 139   < > 144 147* 144  --  149* 146*  K 4.3   < > 4.1 4.4   < > 4.2 4.3 4.1  --  4.3 4.2  CL 106  --  109 103  --   --  110  --   --   --  114*  CO2 27  --  26 26  --   --  23  --   --   --  24  GLUCOSE 256*  --  188* 243*  --   --  235*  --   --   --  374*  BUN 48*  --  44* 39*  --   --  51*  --   --   --  73*  CREATININE 1.22  --  1.22 1.11  --   --  1.31*  --   --   --  1.50*  CALCIUM 8.5*  --  8.7* 8.8*  --   --  8.6*  --   --   --  9.0  MG  --   --   --   --   --   --  2.4  --  2.4  --  2.9*  PHOS  --   --   --   --   --   --  4.2  --  3.8  --  2.0*   < > = values in this interval not displayed.    GFR: Estimated Creatinine Clearance: 35.8 mL/min (A) (by C-G formula based on SCr of 1.5 mg/dL (H)).  Liver Function Tests: Recent Labs  Lab 02/02/19 0550 02/03/19 0445 02/04/19 0530  AST 13* 13* 10*  ALT _0 ALKPHOS 51 54 52  BILITOT 0.9 1.1 1.1  PROT 5.3* 5.3* 5.2*  ALBUMIN 2.5* 2.7* 2.5*     CBG: Recent Labs  Lab 02/07/19 2020 02/07/19 2359 02/08/19 0358 02/08/19 0807 02/08/19 1219  GLUCAP 286* 357* 305* 330* 239*      Recent Results (from the past 240 hour(s))  MRSA PCR Screening     Status: None   Collection Time: 02/04/19  5:55 AM   Specimen: Nasopharyngeal  Result Value Ref Range Status   MRSA by PCR NEGATIVE NEGATIVE Final    Comment:        The GeneXpert MRSA Assay (FDA approved for NASAL specimens only), is one component of a comprehensive MRSA colonization  surveillance program. It is not intended to diagnose MRSA infection nor to guide or monitor treatment for MRSA infections. Performed at [facility], [place] [address], [place], [place] [address]   Culture, blood (routine x 2)     Status: None (Preliminary result)   Collection Time: 02/06/19  7:14 PM   Specimen: BLOOD  Result Value Ref Range Status   Specimen Description  Final    BLOOD Performed at [facility], [place] [address], [place], [place] [address]    Special Requests   Final    BOTTLES DRAWN AEROBIC ONLY Blood Culture adequate volume Performed at [place] [address], [place], [place] [address]    Culture   Final    NO GROWTH 2 DAYS Performed at [place] [facility] [facility] [address] [place], [place] [address]    Report Status PENDING  Incomplete  Culture, Urine     Status: Abnormal   Collection Time: 02/06/19  7:15 PM   Specimen: Urine, Catheterized  Result Value Ref Range Status   Specimen Description   Final    URINE, CATHETERIZED Performed at [facility] [address] [place], [place] [address]    Special Requests   Final    NONE Performed at [facility], [place] [address], [place], [place] [address]    Culture MULTIPLE SPECIES PRESENT, SUGGEST RECOLLECTION (A)  Final   Report Status 02/07/2019 FINAL  Final  Culture, blood (routine x 2)     Status: None (Preliminary result)   Collection Time: 02/06/19  7:19 PM   Specimen: BLOOD  Result Value Ref Range Status   Specimen Description   Final    BLOOD Performed at [place] [address] [place], [place] [address]    Special Requests   Final    BOTTLES DRAWN AEROBIC ONLY Blood Culture adequate volume Performed at [place] [address] [place], [place] [address]    Culture   Final    NO GROWTH 2 DAYS Performed at [facility] [address]  [place], [place] [address]    Report Status PENDING  Incomplete      Radiology Studies: Dg Chest Port 1 View  Result Date: 02/08/2019 CLINICAL DATA:  Endotracheal tube placement. EXAM: PORTABLE CHEST 1 VIEW COMPARISON:  February 07, 2019. FINDINGS: Endotracheal and nasogastric tubes are unchanged in position. Stable cardiomegaly. Left subclavian catheter is unchanged in position no pneumothorax is noted. Stable bilateral lung opacities are noted. Bony thorax is unremarkable. IMPRESSION: Stable support apparatus. Stable bilateral opacities consistent with multifocal pneumonia. Electronically Signed   By: Marijo Conception M.D.   On: 02/08/2019 08:43   Dg Chest Port 1 View  Result Date: 02/07/2019 CLINICAL DATA:  Pneumonia. EXAM: PORTABLE CHEST 1 VIEW COMPARISON:  February 06, 2019. FINDINGS: Stable cardiomediastinal silhouette. Endotracheal and nasogastric tubes are unchanged in position. No pneumothorax or pleural effusion is noted. Stable bilateral lung opacities are noted most consistent with multifocal pneumonia. Bony thorax is unremarkable. IMPRESSION: Stable bilateral lung opacities are noted consistent with multifocal pneumonia. Stable support apparatus. Electronically Signed   By: Marijo Conception M.D.   On: 02/07/2019 07:48   Dg Chest Port 1 View  Result Date: 02/06/2019 CLINICAL DATA:  Intubation, central line EXAM: PORTABLE CHEST 1 VIEW COMPARISON:  Radiograph 02/05/2019 FINDINGS: Endotracheal tube in the mid trachea, 3 cm from the carina. Transesophageal tube tip and side port distal to the GE junction. Left PICC tip terminates at the superior cavoatrial junction. Extensive and worsening bilateral airspace disease with predominantly increasing perihilar opacity in the right lung. Stable cardiomegaly. No pneumothorax. Suspect small left effusion. No right effusion. No acute osseous or soft tissue abnormality. IMPRESSION: 1. Extensive and worsening bilateral airspace disease with predominantly  increasing perihilar opacity in the right lung. 2. Satisfactory positioning of lines and  tubes as above. Electronically Signed   By: Lovena Le M.D.   On: 02/06/2019 17:22       LOS: 12 days   [facility] Pager on [email or web address]  02/08/2019, 12:49 PM

## 2019-02-08 NOTE — Progress Notes (Signed)
Inpatient Diabetes Program Recommendations  AACE/ADA: New Consensus Statement on Inpatient Glycemic Control   Target Ranges:  Prepandial:   less than 140 mg/dL      Peak postprandial:   less than 180 mg/dL (1-2 hours)      Critically ill patients:  140 - 180 mg/dL   Results for JOUSHUA, DUGAR" (MRN [id number]) as of 02/08/2019 08:34  Ref. Range 02/07/2019 07:27 02/07/2019 11:44 02/07/2019 17:23 02/07/2019 20:20 02/07/2019 23:59 02/08/2019 03:58 02/08/2019 08:07  Glucose-Capillary Latest Ref Range: 70 - 99 mg/dL 244 (H) 190 (H) 262 (H) 286 (H) 357 (H) 305 (H) 330 (H)   Review of Glycemic Control  Current orders for Inpatient glycemic control: Novolog 0-20 units Q24H; Vital @ 45 ml/hr  Inpatient Diabetes Program Recommendations:   Insulin-Tube Feeding Coverage:  Please consider ordering Novolog 4 units Q4H for tube feeding coverage. If tube feeding is stopped or held then Novolog tube feeding coverage should also be stopped or held.  Thanks, Barnie Alderman, RN, MSN, CDE Diabetes Coordinator Inpatient Diabetes Program [phone] (Team Pager from 8am to 5pm)

## 2019-02-09 ENCOUNTER — Inpatient Hospital Stay (HOSPITAL_COMMUNITY): Payer: Medicare Other

## 2019-02-09 DIAGNOSIS — J1289 Other viral pneumonia: Secondary | ICD-10-CM | POA: Diagnosis not present

## 2019-02-09 DIAGNOSIS — J80 Acute respiratory distress syndrome: Secondary | ICD-10-CM | POA: Diagnosis not present

## 2019-02-09 DIAGNOSIS — R509 Fever, unspecified: Secondary | ICD-10-CM

## 2019-02-09 DIAGNOSIS — J9601 Acute respiratory failure with hypoxia: Secondary | ICD-10-CM | POA: Diagnosis not present

## 2019-02-09 DIAGNOSIS — U071 COVID-19: Secondary | ICD-10-CM | POA: Diagnosis not present

## 2019-02-09 LAB — CBC
HCT: 43.5 % (ref 39.0–52.0)
Hemoglobin: 13 g/dL (ref 13.0–17.0)
MCH: 27.7 pg (ref 26.0–34.0)
MCHC: 29.9 g/dL — ABNORMAL LOW (ref 30.0–36.0)
MCV: 92.8 fL (ref 80.0–100.0)
Platelets: 116 10*3/uL — ABNORMAL LOW (ref 150–400)
RBC: 4.69 MIL/uL (ref 4.22–5.81)
RDW: 16.6 % — ABNORMAL HIGH (ref 11.5–15.5)
WBC: 18.7 10*3/uL — ABNORMAL HIGH (ref 4.0–10.5)
nRBC: 0 % (ref 0.0–0.2)

## 2019-02-09 LAB — BASIC METABOLIC PANEL
Anion gap: 9 (ref 5–15)
BUN: 96 mg/dL — ABNORMAL HIGH (ref 8–23)
CO2: 27 mmol/L (ref 22–32)
Calcium: 7.8 mg/dL — ABNORMAL LOW (ref 8.9–10.3)
Chloride: 107 mmol/L (ref 98–111)
Creatinine, Ser: 1.74 mg/dL — ABNORMAL HIGH (ref 0.61–1.24)
GFR calc Af Amer: 41 mL/min — ABNORMAL LOW (ref >60)
GFR calc non Af Amer: 35 mL/min — ABNORMAL LOW (ref >60)
Glucose, Bld: 401 mg/dL — ABNORMAL HIGH (ref 70–99)
Potassium: 3.7 mmol/L (ref 3.5–5.1)
Sodium: 143 mmol/L (ref 135–145)

## 2019-02-09 LAB — URINALYSIS, ROUTINE W REFLEX MICROSCOPIC
Bilirubin Urine: NEGATIVE
Glucose, UA: NEGATIVE mg/dL
Ketones, ur: NEGATIVE mg/dL
Nitrite: NEGATIVE
Protein, ur: 30 mg/dL — AB
Specific Gravity, Urine: 1.014 (ref 1.005–1.030)
pH: 5 (ref 5.0–8.0)

## 2019-02-09 LAB — POCT I-STAT 7, (LYTES, BLD GAS, ICA,H+H)
Acid-base deficit: 1 mmol/L (ref 0.0–2.0)
Bicarbonate: 24.7 mmol/L (ref 20.0–28.0)
Calcium, Ion: 1.17 mmol/L (ref 1.15–1.40)
HCT: 39 % (ref 39.0–52.0)
Hemoglobin: 13.3 g/dL (ref 13.0–17.0)
O2 Saturation: 84 %
Patient temperature: 98.6
Potassium: 3.7 mmol/L (ref 3.5–5.1)
Sodium: 144 mmol/L (ref 135–145)
TCO2: 26 mmol/L (ref 22–32)
pCO2 arterial: 46 mmHg (ref 32.0–48.0)
pH, Arterial: 7.337 — ABNORMAL LOW (ref 7.350–7.450)
pO2, Arterial: 52 mmHg — ABNORMAL LOW (ref 83.0–108.0)

## 2019-02-09 LAB — GLUCOSE, CAPILLARY
Glucose-Capillary: 190 mg/dL — ABNORMAL HIGH (ref 70–99)
Glucose-Capillary: 264 mg/dL — ABNORMAL HIGH (ref 70–99)
Glucose-Capillary: 325 mg/dL — ABNORMAL HIGH (ref 70–99)
Glucose-Capillary: 340 mg/dL — ABNORMAL HIGH (ref 70–99)
Glucose-Capillary: 360 mg/dL — ABNORMAL HIGH (ref 70–99)

## 2019-02-09 LAB — PROCALCITONIN: Procalcitonin: 0.99 ng/mL

## 2019-02-09 LAB — MAGNESIUM: Magnesium: 2.9 mg/dL — ABNORMAL HIGH (ref 1.7–2.4)

## 2019-02-09 LAB — PHOSPHORUS: Phosphorus: 4.7 mg/dL — ABNORMAL HIGH (ref 2.5–4.6)

## 2019-02-09 MED ORDER — PIPERACILLIN-TAZOBACTAM 3.375 G IVPB
3.3750 g | Freq: Three times a day (TID) | INTRAVENOUS | Status: DC
  Filled 2019-02-09 (×2): qty 50

## 2019-02-09 MED ORDER — VANCOMYCIN HCL IN DEXTROSE 1-5 GM/200ML-% IV SOLN: 1000.0000 mg | INTRAVENOUS | Status: DC

## 2019-02-09 MED ORDER — APIXABAN 2.5 MG PO TABS
2.5000 mg | ORAL_TABLET | Freq: Two times a day (BID) | ORAL | Status: DC
  Filled 2019-02-09 (×2): qty 1

## 2019-02-09 MED ORDER — PIPERACILLIN-TAZOBACTAM 3.375 G IVPB 30 MIN
3.3750 g | Freq: Once | INTRAVENOUS | Status: AC
  Administered 2019-02-09: 3.375 g via INTRAVENOUS
  Filled 2019-02-09: qty 50

## 2019-02-09 MED ORDER — EPINEPHRINE 1 MG/10ML IJ SOSY
PREFILLED_SYRINGE | INTRAMUSCULAR | Status: AC
  Filled 2019-02-09: qty 30

## 2019-02-09 MED ORDER — VANCOMYCIN HCL 10 G IV SOLR
1750.0000 mg | Freq: Once | INTRAVENOUS | Status: AC
  Administered 2019-02-09: 1750 mg via INTRAVENOUS
  Filled 2019-02-09: qty 1750

## 2019-02-09 MED ORDER — PIPERACILLIN-TAZOBACTAM 3.375 G IVPB
3.3750 g | Freq: Once | INTRAVENOUS | Status: DC
  Filled 2019-02-09: qty 50

## 2019-02-09 MED ORDER — INSULIN GLARGINE 100 UNIT/ML ~~LOC~~ SOLN
25.0000 [IU] | Freq: Every day | SUBCUTANEOUS | Status: DC
  Administered 2019-02-09: 25 [IU] via SUBCUTANEOUS
  Filled 2019-02-09: qty 0.25

## 2019-02-10 LAB — BLOOD CULTURE ID PANEL (REFLEXED)

## 2019-02-11 LAB — CULTURE, BLOOD (ROUTINE X 2)
Culture: NO GROWTH
Culture: NO GROWTH
Special Requests: ADEQUATE
Special Requests: ADEQUATE

## 2019-02-11 LAB — CULTURE, RESPIRATORY W GRAM STAIN

## 2019-02-11 NOTE — Progress Notes (Signed)
Spoke with patient's son, update provided. Questions and concerns addressed. Appreciative of update.

## 2019-02-11 NOTE — Progress Notes (Signed)
Inpatient Diabetes Program Recommendations  AACE/ADA: New Consensus Statement on Inpatient Glycemic Control   Target Ranges:  Prepandial:   less than 140 mg/dL      Peak postprandial:   less than 180 mg/dL (1-2 hours)      Critically ill patients:  140 - 180 mg/dL   Results for AMEAR, STROJNY" (MRN [id number]) as of 02-16-2019 10:51  Ref. Range 02/08/2019 08:07 02/08/2019 12:19 02/08/2019 15:27 02/08/2019 19:59 02/08/2019 21:33 02-16-2019 00:22 2019/02/16 03:48 February 16, 2019 07:51  Glucose-Capillary Latest Ref Range: 70 - 99 mg/dL 330 (H) 239 (H) 220 (H) 217 (H) 254 (H) 325 (H) 340 (H) 360 (H)   Review of Glycemic Control  Current orders for Inpatient glycemic control:Lantus 25 units daily, Novolog 4 units Q4H, Novolog 0-20 units Q24H; Vital @ 45 ml/hr  Inpatient Diabetes Program Recommendations:   Insulin-Noted glucose is consistently elevated. If glucose continues to be consistently greater than 200 mg/dl, please consider discontinuing all SQ insulin orders and use ICU Glycemic Control Phase 2 IV insulin to improve glycemic control and help determine insulin needs.  NOTE: Last received Solumedrol on 02/08/19. No other steroids ordered at this time. Patient remains on ventilator and tube feedings and glucose has been consistently greater than 325 mg/dl over the past 8 hours.  Noted Lantus increased from 15 to 25 units daily today.  Thanks, Barnie Alderman, RN, MSN, CDE Diabetes Coordinator Inpatient Diabetes Program [phone] (Team Pager from 8am to 5pm)

## 2019-02-11 NOTE — Progress Notes (Signed)
Pharmacy Antibiotic Note  [name]. is a 85 y.o. male admitted on 02/07/2019 with pneumonia.  Pharmacy has been consulted for Zosyn and vancomycin dosing. Patient has worsening leukocytosis with persistent low grade fevers and increasing vasopressor requirement. SCr has trended up to 1.74 today   Plan: -Zosyn 3.375 gm IV Q 8 hours (EI infusion) -Vancomycin 1750 mg IV once, then start Vancomycin 1000 mg IV Q 24 hrs. Goal AUC 400-550. Expected AUC: 494 SCr used: 1.74 -Monitor CBC, renal fx, cultures and clinical progress -Vanc peak/trough as indicated    Height: 5\' 6"  (167.6 cm) Weight: 181 lb 14.1 oz (82.5 kg) IBW/kg (Calculated) : 63.8  Temp (24hrs), Avg:100.6 F (38.1 C), Min:99.6 F (37.6 C), Max:102 F (38.9 C)  Recent Labs  Lab 02/04/19 0530 02/05/19 0543 02/07/19 0430 02/08/19 0548 March 08, 2019 0425  WBC 10.0 10.2 14.8* 14.1* 18.7*  CREATININE 1.22 1.11 1.31* 1.50* 1.74*    Estimated Creatinine Clearance: 31.3 mL/min (A) (by C-G formula based on SCr of 1.74 mg/dL (H)).    Allergies  Allergen Reactions  . Contrast Media [Iodinated Diagnostic Agents] Shortness Of Breath  . Morphine And Related Other (See Comments)    Hallucinations   . Niacin And Related Dermatitis  . Other     Other reaction(s): SHORTNESS OF BREATH  . Amlodipine Rash    Antimicrobials this admission: Vanc 10/30 >>  Zosyn 10/30 >>   Dose adjustments this admission:   Microbiology results: 10/27 BCx: ngtd 10/27 UCx: mult species   10/25 MRSA PCR: neg   Thank you for allowing pharmacy to be a part of this patient's care.  Albertina Parr, PharmD., BCPS Clinical Pharmacist Clinical phone for 03-08-19 until 3:30pm: [phone]

## 2019-02-11 NOTE — Progress Notes (Signed)
Noted pt aystole initial partial code status. Pt at 2025 has pt did not hae a pulse, respirations or heart beat.. MD on duty called and informed. Pt family called and informed of the passing of pt. Funeral home called and will pick up body in AM. Pt to be moved to the morgue.

## 2019-02-11 NOTE — Progress Notes (Addendum)
PROGRESS NOTE  [name]. OTL:[id number] DOB: 1931/09/18 DOA: 01/23/2019  PCP: Jearld Fenton, NP  Brief History/Interval Summary: 85 year old with a history of CAD, chronic atrial fibrillation on Eliquis, chronic diastolic CHF, COPD, CVA x2, and DM 2, PAD status post BKA June 2020, and obstructive sleep apnea on nightly CPAP who presented to the ED with fever and was initially admitted to [facility] with a diagnosis of pneumonia.  When his Covid test returned positive he was transferred to [facility].  On 1021 he experienced an acute decompensation in which he exhibited marked increased work of breathing with tachypnea and altered mental status.  He was transferred to the ICU and intubated.   Reason for Visit: Acute respiratory failure with hypoxia.  Pneumonia due to COVID-19  Consultants: Pulmonology   Significant Events: 10/15 admit to [facility] 10/17 transfer to [facility] 10/21 transfer to ICU - intubated 10/22 extubated - trial of nightly BIPAP 10/23 trial of nightly HHFNC as BIPAP substitute 10/24 transfer to PCU -acute decompensation requiring transfer back to ICU and reinitiation of BiPAP 10/27: Had to be reintubated   Antibiotics: Anti-infectives (From admission, onward)   Start     Dose/Rate Route Frequency Ordered Stop   02/03/19 1000  fluconazole (DIFLUCAN) tablet 100 mg  Status:  Discontinued     100 mg Oral Daily 02/02/19 1206 02/07/19 1300   02/01/19 1000  fluconazole (DIFLUCAN) IVPB 100 mg  Status:  Discontinued     100 mg 50 mL/hr over 60 Minutes Intravenous Every 24 hours 01/31/19 1438 02/02/19 1206   01/31/19 1500  fluconazole (DIFLUCAN) IVPB 200 mg     200 mg 100 mL/hr over 60 Minutes Intravenous  Once 01/31/19 1438 02/01/19 0700   01/31/19 1400  fluconazole (DIFLUCAN) IVPB 100 mg  Status:  Discontinued     100 mg 50 mL/hr over 60 Minutes Intravenous Every 24 hours 01/31/19 1347 01/31/19 1438   01/29/19 1115  Ampicillin-Sulbactam (UNASYN) 3 g in  sodium chloride 0.9 % 100 mL IVPB     3 g 200 mL/hr over 30 Minutes Intravenous Every 6 hours 01/29/19 1100 02/01/19 0700   01/28/19 1000  remdesivir 100 mg in sodium chloride 0.9 % 250 mL IVPB     100 mg 500 mL/hr over 30 Minutes Intravenous Every 24 hours 02/05/2019 1143 01/30/19 1800   01/31/2019 1300  remdesivir 100 mg in sodium chloride 0.9 % 250 mL IVPB     100 mg 500 mL/hr over 30 Minutes Intravenous Every 24 hours 01/22/2019 1143 01/11/2019 1400      Subjective/Interval History: Patient remains intubated and sedated.    Assessment/Plan:  Acute Hypoxic Resp. Failure/Pneumonia due to COVID-19  Vent Mode: PRVC FiO2 (%):  [40 %-60 %] 40 % Set Rate:  [20 bmp] 20 bmp Vt Set:  [500 mL] 500 mL PEEP:  [8 cmH20] 8 cmH20 Plateau Pressure:  [24 cmH20-30 cmH20] 30 cmH20     Component Value Date/Time   PHART 7.337 (L) March 07, 2019 0326   PCO2ART 46.0 2019-03-07 0326   PO2ART 52.0 (L) 03/07/19 0326   HCO3 24.7 March 07, 2019 0326   TCO2 26 03-07-2019 0326   ACIDBASEDEF 1.0 Mar 07, 2019 0326   O2SAT 84.0 07-Mar-2019 0326     Lab Results  Component Value Date   SARSCOV2NAA POSITIVE (A) 01/25/2019   [place] NEGATIVE 10/12/2018   [place] NEGATIVE 10/03/2018   [place] NEGATIVE 09/10/2018    Pulmonology is following and managing.  From a COVID-19 perspective patient has completed 5-day  course of remdesivir.  He has completed course of steroids as well however he he was placed back on steroids due to wheezing.  That has also been discontinued.  Patient was reintubated a few days ago as he was doing poorly on the floor.  Prognosis is guarded.  Pulmonology is discussing with family regarding goals of care.  Patient noted to have fever over the last 24 hours.  Currently not on any antibiotics.  Discussed with pulmonology.  Will initiate antibiotics.  Overnight there was some concern for stroke.  However it was felt that changes noted were most likely due to sedation medications.  CT head  was done which did not show any acute process.  Continue to monitor closely.  Prognosis remains guarded.  Fever Noted to have fever over the last 24 hours.  WBC has gone up to 18.7.  Part of the leukocytosis is likely due to steroids.  Cannot rule out infection.  Will panculture and initiate antibiotics.  Discussed with pulmonology.    Obstructive sleep apnea He is BiPAP dependent.  He was not on BiPAP at [facility] due to the hospital policies and protocols.  This could have contributed to his decline.  History of COPD with chronic hypoxic respiratory failure Uses oxygen at home at 2 L/min.  Continue with steroids as he was wheezing.  Acute on chronic diastolic CHF Based on echocardiogram from 2016 his EF is 60%.  He does have edema in his upper extremities.  He is not on chronic diuretic treatment.  Continue to monitor ins and outs and daily weights.  Patient being given diuretics daily based on volume status.  Mild hypernatremia Sodium level is normal today.  Chronic atrial fibrillation with occasional RVR It was poorly controlled 2 nights ago.  Patient was given digoxin.  Placed on metoprolol twice a day.  However it appears that this caused drop in his blood pressure.  He is on Levophed.  Chads 2 vascular score is 3.  Patient is on apixaban.    Acute kidney injury on chronic kidney disease stage 3 Baseline creatinine around 1.2.  Rising BUN and creatinine most likely due to diuretics.  Continue to monitor urine output.  Essential hypertension As discussed above he did have a drop in his blood pressure which is most likely due to metoprolol.  He is on pressors.  Continue to monitor.  Dyslipidemia Continue statin when able to take orally.  History of coronary artery disease status post CABG Stable.  Continue beta-blocker.  Recent left BKA Left BKA and right heel ulcers noted at the time of presentation.  Continue wound care.  Hypothyroidism Continue  Synthroid.  Diabetes mellitus type 2, uncontrolled with hyperglycemia HbA1c was 6.7.  Poorly controlled CBGs most likely due to steroids.  Lantus insulin was added yesterday.  Solu-Medrol has been discontinued.  Hopefully this will help with those CBGs.  Continue SSI for now.  Pressure injuries Pressure Injury 01/26/19 Heel Right Unstageable - Full thickness tissue loss in which the base of the ulcer is covered by slough (yellow, tan, gray, green or brown) and/or eschar (tan, brown or black) in the wound bed. (Active)  01/26/19   Location: Heel  Location Orientation: Right  Staging: Unstageable - Full thickness tissue loss in which the base of the ulcer is covered by slough (yellow, tan, gray, green or brown) and/or eschar (tan, brown or black) in the wound bed.  Wound Description (Comments):   Present on Admission: Yes  Pressure Injury 01/26/2019 Sacrum Stage I -  Intact skin with non-blanchable redness of a localized area usually over a bony prominence. (Active)  01/24/2019 1241  Location: Sacrum  Location Orientation:   Staging: Stage I -  Intact skin with non-blanchable redness of a localized area usually over a bony prominence.  Wound Description (Comments):   Present on Admission: Yes   FEN Not on any fluids at this time.  Initiate tube feedings.  Monitor electrolytes.  DVT Prophylaxis: On Eliquis PUD Prophylaxis: Protonix Code Status: Partial code Family Communication: Family updated by PCCM Disposition Plan: Remain in ICU.   Medications:  Scheduled:  apixaban  2.5 mg Per Tube BID   chlorhexidine gluconate (MEDLINE KIT)  15 mL Mouth Rinse BID   Chlorhexidine Gluconate Cloth  6 each Topical Daily   citalopram  10 mg Oral Daily   collagenase   Topical Daily   docusate  100 mg Per Tube BID   dorzolamide  1 drop Both Eyes TID   feeding supplement (PRO-STAT SUGAR FREE 64)  30 mL Per Tube QID   fentaNYL (SUBLIMAZE) injection  25 mcg Intravenous Once   finasteride   5 mg Oral Daily   free water  250 mL Per Tube Q4H   insulin aspart  0-20 Units Subcutaneous Q4H   insulin aspart  4 Units Subcutaneous Q4H   insulin glargine  25 Units Subcutaneous Daily   latanoprost  1 drop Both Eyes QHS   levothyroxine  50 mcg Oral QAC breakfast   mouth rinse  15 mL Mouth Rinse 10 times per day   metoprolol tartrate  50 mg Per Tube BID   montelukast  10 mg Oral QHS   multivitamin with minerals  1 tablet Per Tube Daily   pantoprazole sodium  40 mg Per Tube Daily   sodium chloride flush  3 mL Intravenous Q12H   tamsulosin  0.4 mg Oral QPC supper   traZODone  100 mg Oral QHS   vecuronium  10 mg Intravenous Once   Continuous:  sodium chloride Stopped (14-Feb-2019 0805)   dexmedetomidine (PRECEDEX) IV infusion Stopped (14-Feb-2019 1030)   feeding supplement (VITAL 1.5 CAL) 45 mL/hr at 2019-02-14 1330   fentaNYL infusion INTRAVENOUS Stopped (14-Feb-2019 1054)   norepinephrine (LEVOPHED) Adult infusion 18 mcg/min (14-Feb-2019 1300)   TDV:VOHYWV chloride, acetaminophen, docusate, fentaNYL, LORazepam, magnesium citrate, metoprolol tartrate, nitroGLYCERIN, [DISCONTINUED] ondansetron **OR** ondansetron (ZOFRAN) IV, sodium chloride flush   Objective:  Vital Signs  Vitals:   February 14, 2019 1130 14-Feb-2019 1200 02/14/19 1250 02-14-2019 1300  BP:  (!) 115/56 (!) 115/56 (!) 121/101  Pulse: (!) 105 (!) 110 (!) 115   Resp: (!) 24 (!) 23 (!) 24   Temp:  100.2 F (37.9 C)    TempSrc:      SpO2: 92% 92% 91%   Weight:      Height:        Intake/Output Summary (Last 24 hours) at 02/14/19 1400 Last data filed at 02/14/2019 1300 Gross per 24 hour  Intake 6867.37 ml  Output 2755 ml  Net 4112.37 ml   Filed Weights   01/29/2019 1413 02-14-19 0500  Weight: 80 kg 82.5 kg     General appearance: Intubated and sedated Resp: Coarse breath sound bilaterally.  Crackles at the bases.  No wheezing or rhonchi today.   Cardio: S1-S2 is irregularly irregular.  No S3-S4.  No  bruit. GI: Abdomen is soft.  Nontender nondistended.  Bowel sounds are present normal.  No masses organomegaly Extremities:  Left BKA.  Edema bilateral upper and lower extremities.  Good pulses. Neurologic: Concern for unequal pupils.  Does have gag reflex however.    Lab Results:  Data Reviewed: I have personally reviewed following labs and imaging studies  CBC: Recent Labs  Lab 02/04/19 0530 02/05/19 0543  02/07/19 0430 02/07/19 0525 02/08/19 0331 02/08/19 0548 02-19-2019 0326 02/19/19 0425  WBC 10.0 10.2  --  14.8*  --   --  14.1*  --  18.7*  HGB 13.5 13.5   < > 13.5 13.3 13.6 13.9 13.3 13.0  HCT 42.6 43.7   < > 44.5 39.0 40.0 45.9 39.0 43.5  MCV 91.2 90.3  --  91.8  --   --  91.6  --  92.8  PLT 190 175  --  201  --   --  158  --  116*   < > = values in this interval not displayed.    Basic Metabolic Panel: Recent Labs  Lab 02/04/19 0530 02/05/19 0543  02/07/19 0430 02/07/19 0525 02/07/19 1602 02/08/19 0331 02/08/19 0548 02/08/19 1810 Feb 19, 2019 0326 02/19/19 0425  NA 145 139   < > 147* 144  --  149* 146*  --  144 143  K 4.1 4.4   < > 4.3 4.1  --  4.3 4.2  --  3.7 3.7  CL 109 103  --  110  --   --   --  114*  --   --  107  CO2 26 26  --  23  --   --   --  24  --   --  27  GLUCOSE 188* 243*  --  235*  --   --   --  374*  --   --  401*  BUN 44* 39*  --  51*  --   --   --  73*  --   --  96*  CREATININE 1.22 1.11  --  1.31*  --   --   --  1.50*  --   --  1.74*  CALCIUM 8.7* 8.8*  --  8.6*  --   --   --  9.0  --   --  7.8*  MG  --   --   --  2.4  --  2.4  --  2.9* 0.9*  --  2.9*  PHOS  --   --   --  4.2  --  3.8  --  2.0* <1.0*  --  4.7*   < > = values in this interval not displayed.    GFR: Estimated Creatinine Clearance: 31.3 mL/min (A) (by C-G formula based on SCr of 1.74 mg/dL (H)).  Liver Function Tests: Recent Labs  Lab 02/03/19 0445 02/04/19 0530  AST 13* 10*  ALT 15 16  ALKPHOS 54 52  BILITOT 1.1 1.1  PROT 5.3* 5.2*  ALBUMIN 2.7* 2.5*      CBG: Recent Labs  Lab 02/08/19 1959 02/08/19 2133 02-19-19 0022 2019-02-19 0348 19-Feb-2019 0751  GLUCAP 217* 254* 325* 340* 360*      Recent Results (from the past 240 hour(s))  MRSA PCR Screening     Status: None   Collection Time: 02/04/19  5:55 AM   Specimen: Nasopharyngeal  Result Value Ref Range Status   MRSA by PCR NEGATIVE NEGATIVE Final    Comment:        The GeneXpert MRSA Assay (FDA approved for NASAL specimens only), is one component of a comprehensive MRSA  colonization surveillance program. It is not intended to diagnose MRSA infection nor to guide or monitor treatment for MRSA infections. Performed at [facility], [place] [address], [place], [place] [address]   Culture, blood (routine x 2)     Status: None (Preliminary result)   Collection Time: 02/06/19  7:14 PM   Specimen: BLOOD  Result Value Ref Range Status   Specimen Description   Final    BLOOD Performed at [place] [address] [place], [place] [address]    Special Requests   Final    BOTTLES DRAWN AEROBIC ONLY Blood Culture adequate volume Performed at [place] [address] [place], [place] [address]    Culture   Final    NO GROWTH 3 DAYS Performed at [facility], [place] [address] [place], [place] [address]    Report Status PENDING  Incomplete  Culture, Urine     Status: Abnormal   Collection Time: 02/06/19  7:15 PM   Specimen: Urine, Catheterized  Result Value Ref Range Status   Specimen Description   Final    URINE, CATHETERIZED Performed at [place] [address], [place], [place] [address]    Special Requests   Final    NONE Performed at [facility], [place] [address], [place], [place] [address]    Culture MULTIPLE SPECIES PRESENT, SUGGEST RECOLLECTION (A)  Final   Report Status 02/07/2019 FINAL  Final  Culture, blood (routine x 2)     Status: None  (Preliminary result)   Collection Time: 02/06/19  7:19 PM   Specimen: BLOOD  Result Value Ref Range Status   Specimen Description   Final    BLOOD Performed at [place] [address] [place], [place] [address]    Special Requests   Final    BOTTLES DRAWN AEROBIC ONLY Blood Culture adequate volume Performed at [place] [address] [place], [place] [address]    Culture   Final    NO GROWTH 3 DAYS Performed at [facility], [place] [address] [place],  [address]    Report Status PENDING  Incomplete      Radiology Studies: Ct Head Wo Contrast  Result Date: 2019-02-17 CLINICAL DATA:  Altered level of consciousness EXAM: CT HEAD WITHOUT CONTRAST TECHNIQUE: Contiguous axial images were obtained from the base of the skull through the vertex without intravenous contrast. COMPARISON:  Sep 10, 2018 FINDINGS: Brain: Mild diffuse atrophy is stable. There is no intracranial mass, hemorrhage, extra-axial fluid collection, or midline shift. There is small vessel disease in the centra semiovale bilaterally, stable in appearance. No acute infarct is demonstrable. Vascular: There is no evident hyperdense vessel. There is calcification in each distal vertebral artery, basilar artery, and carotid siphon region. Basilar artery is tortuous. Skull: The bony calvarium appears intact. Sinuses/Orbits: There is mucosal thickening in several ethmoid air cells. Other paranasal sinuses are clear. Orbits appear symmetric bilaterally. Note that patient is intubated. Other: Mastoid air cells are clear. IMPRESSION: Stable atrophy with periventricular small vessel disease. No acute infarct. No mass or hemorrhage. There are multiple foci of arterial vascular calcification. There is mucosal thickening in several ethmoid air cells. Electronically Signed   By: Lowella Grip III M.D.   On: 2019/02/17 13:22   Dg Chest Port 1 View  Result Date:  02-17-2019 CLINICAL DATA:  Endotracheal tube placement. EXAM: PORTABLE CHEST 1 VIEW COMPARISON:  02/08/2019  FINDINGS: Enteric tube courses into the stomach and off the film as tip is not visualized. Side-port is located over the stomach in the left upper quadrant. Left subclavian central venous catheter unchanged. Endotracheal tube has tip 3.2 cm above the carina. Lungs are adequately inflated and demonstrate persistent bilateral multifocal airspace process without significant change likely due to infection. No effusion. Mild stable cardiomegaly. Remainder of the exam is unchanged. IMPRESSION: Stable bilateral multifocal airspace process likely infection. Stable cardiomegaly. Tubes and lines unchanged. Electronically Signed   By: Marin Olp M.D.   On: 2019-02-21 07:23   Dg Chest Port 1 View  Result Date: 02/08/2019 CLINICAL DATA:  Endotracheal tube placement. EXAM: PORTABLE CHEST 1 VIEW COMPARISON:  February 07, 2019. FINDINGS: Endotracheal and nasogastric tubes are unchanged in position. Stable cardiomegaly. Left subclavian catheter is unchanged in position no pneumothorax is noted. Stable bilateral lung opacities are noted. Bony thorax is unremarkable. IMPRESSION: Stable support apparatus. Stable bilateral opacities consistent with multifocal pneumonia. Electronically Signed   By: Marijo Conception M.D.   On: 02/08/2019 08:43       LOS: 13 days   [facility] Pager on [email or web address]  2019-02-21, 2:00 PM

## 2019-02-11 NOTE — Significant Event (Signed)
Responded to code blue on patient.  Patient was asystole.  Patient, who was already intubated, was a partial code: may use medications but no chest compressions as per code status.  Multiple rounds of epinephrine were administered without success.  Patient remained in asystole.  No audible heart sounds, no palpable carotid pulse, no response to stimuli, persistent asystole on the monitor, Art line reading 0 BP and flat.  No respiratory motion.  Patient pronounced at 426ST, death certificate filled out.  Son has been notified of patient death and will call and inform sister.

## 2019-02-11 NOTE — Death Summary Note (Signed)
DEATH SUMMARY   Patient Details  Name: [name]. MRN: [id number] DOB: 1931-10-15  Admission/Discharge Information   Admit Date:  Jan 30, 2019  Date of Death: Date of Death: 02-12-19  Time of Death: Time of Death: 2023/06/23  Length of Stay: 2022/06/26  Referring Physician: Jearld Fenton, NP   Reason(s) for Hospitalization  Pneumonia due to COVID-19 Acute respiratory failure with hypoxia  Diagnoses  Preliminary cause of death: Pneumonia due to COVID-19  Secondary Diagnoses (including complications and co-morbidities):   Pressure injury of skin   Acute respiratory failure with hypoxemia (HCC)   SOB (shortness of breath)   ARDS (adult respiratory distress syndrome) ([place])   Shock circulatory [facility])     Brief Hospital Course (including significant findings, care, treatment, and services provided and events leading to death)   Brief History/Interval Summary: 85 year old with a history of CAD, chronic atrial fibrillation on Eliquis, chronic diastolic CHF, COPD, CVA x2, and DM 2, PAD status post BKA June 2020, and obstructive sleep apnea on nightly CPAP who presented to the ED with fever and was initially admitted to [facility] with a diagnosis of pneumonia. When his Covid test returned positive he was transferred to [facility].  On 23-Jun-1019 he experienced an acute decompensation in which he exhibited marked increased work of breathing with tachypnea and altered mental status. He was transferred to the ICU and intubated.  Significant Events: 10/15 admit to [facility] 01-30-2023 transfer to [facility] 10/21 transfer to ICU - intubated 10/22 extubated - trial of nightly BIPAP 10/23 trial of nightly HHFNC as BIPAP substitute 10/24 transfer to PCU -acute decompensation requiring transfer back to ICU and reinitiation of BiPAP 10/27: Had to be reintubated   Hospital course:  Acute Hypoxic Resp. Failure/Pneumonia due to COVID-19 Patient was admitted to the hospital.  He was placed on remdesivir and  steroids.  He was extubated.  Transition to the floor.  However he declined again.  Had to be brought back to the intensive care unit and had to be intubated again.  Pulmonology was following.  Prognosis was thought to be poor.  It was also felt that he was aspirating. Patient noted to be febrile as well.  He was started on antibiotics after discussions were held with critical care medicine.  There was also some concern for altered mentation and unequal pupils.  CT head did not show any acute findings.  Prognosis was thought to be guarded to poor.  Subsequently on the night of 02-12-2023 patient went into cardiac arrest.  He was limited resuscitation. Epinephrine was given without any response.  Patient subsequently expired on 2019-02-12 at 8:25 PM.  Fever/Pseudomonas bacteremia WBC up to 18.7.    Cultures were sent.  Patient was started on antibiotics.  It appears that his blood is growing Pseudomonas.    Obstructive sleep apnea He is BiPAP dependent.    History of COPD with chronic hypoxic respiratory failure Uses oxygen at home at 2 L/min.   Acute on chronic diastolic CHF Based on echocardiogram from 06-23-2014 his EF is 60%.    Mild hypernatremia  Chronic atrial fibrillation with occasional RVR Chads 2 vascular score is 3.  Patient is on apixaban.    Acute kidney injury on chronic kidney disease stage 3 Rising BUN and creatinine most likely due to diuretics.  History of coronary artery disease status post CABG Stable.  Recent left BKA Left BKA and right heel ulcers noted at the time of presentation.  Continue wound care.  Hypothyroidism  Continue Synthroid.  Diabetes mellitus type 2, uncontrolled with hyperglycemia HbA1c was 6.7.  Poorly controlled CBGs most likely due to steroids.   Pressure injuries Pressure Injury 01/26/19 Heel Right Unstageable - Full thickness tissue loss in which the base of the ulcer is covered by slough (yellow, tan, gray, green or brown) and/or eschar  (tan, brown or black) in the wound bed. (Active)  01/26/19   Location: Heel  Location Orientation: Right  Staging: Unstageable - Full thickness tissue loss in which the base of the ulcer is covered by slough (yellow, tan, gray, green or brown) and/or eschar (tan, brown or black) in the wound bed.  Wound Description (Comments):   Present on Admission: Yes     Pressure Injury 01/30/2019 Sacrum Stage I -  Intact skin with non-blanchable redness of a localized area usually over a bony prominence. (Active)  01/14/2019 1241  Location: Sacrum  Location Orientation:   Staging: Stage I -  Intact skin with non-blanchable redness of a localized area usually over a bony prominence.  Wound Description (Comments):   Present on Admission: Yes       Pertinent Labs and Studies  Significant Diagnostic Studies Ct Head Wo Contrast  Result Date: 03-09-19 CLINICAL DATA:  Altered level of consciousness EXAM: CT HEAD WITHOUT CONTRAST TECHNIQUE: Contiguous axial images were obtained from the base of the skull through the vertex without intravenous contrast. COMPARISON:  Sep 10, 2018 FINDINGS: Brain: Mild diffuse atrophy is stable. There is no intracranial mass, hemorrhage, extra-axial fluid collection, or midline shift. There is small vessel disease in the centra semiovale bilaterally, stable in appearance. No acute infarct is demonstrable. Vascular: There is no evident hyperdense vessel. There is calcification in each distal vertebral artery, basilar artery, and carotid siphon region. Basilar artery is tortuous. Skull: The bony calvarium appears intact. Sinuses/Orbits: There is mucosal thickening in several ethmoid air cells. Other paranasal sinuses are clear. Orbits appear symmetric bilaterally. Note that patient is intubated. Other: Mastoid air cells are clear. IMPRESSION: Stable atrophy with periventricular small vessel disease. No acute infarct. No mass or hemorrhage. There are multiple foci of arterial  vascular calcification. There is mucosal thickening in several ethmoid air cells. Electronically Signed   By: Lowella Grip III M.D.   On: Mar 09, 2019 13:22   Dg Chest Port 1 View  Result Date: 03/09/2019 CLINICAL DATA:  Endotracheal tube placement. EXAM: PORTABLE CHEST 1 VIEW COMPARISON:  02/08/2019 FINDINGS: Enteric tube courses into the stomach and off the film as tip is not visualized. Side-port is located over the stomach in the left upper quadrant. Left subclavian central venous catheter unchanged. Endotracheal tube has tip 3.2 cm above the carina. Lungs are adequately inflated and demonstrate persistent bilateral multifocal airspace process without significant change likely due to infection. No effusion. Mild stable cardiomegaly. Remainder of the exam is unchanged. IMPRESSION: Stable bilateral multifocal airspace process likely infection. Stable cardiomegaly. Tubes and lines unchanged. Electronically Signed   By: Marin Olp M.D.   On: 03/09/19 07:23   Dg Chest Port 1 View  Result Date: 02/08/2019 CLINICAL DATA:  Endotracheal tube placement. EXAM: PORTABLE CHEST 1 VIEW COMPARISON:  February 07, 2019. FINDINGS: Endotracheal and nasogastric tubes are unchanged in position. Stable cardiomegaly. Left subclavian catheter is unchanged in position no pneumothorax is noted. Stable bilateral lung opacities are noted. Bony thorax is unremarkable. IMPRESSION: Stable support apparatus. Stable bilateral opacities consistent with multifocal pneumonia. Electronically Signed   By: Marijo Conception M.D.   On: 02/08/2019 08:43  Dg Chest Port 1 View  Result Date: 02/07/2019 CLINICAL DATA:  Pneumonia. EXAM: PORTABLE CHEST 1 VIEW COMPARISON:  February 06, 2019. FINDINGS: Stable cardiomediastinal silhouette. Endotracheal and nasogastric tubes are unchanged in position. No pneumothorax or pleural effusion is noted. Stable bilateral lung opacities are noted most consistent with multifocal pneumonia. Bony thorax  is unremarkable. IMPRESSION: Stable bilateral lung opacities are noted consistent with multifocal pneumonia. Stable support apparatus. Electronically Signed   By: Marijo Conception M.D.   On: 02/07/2019 07:48   Dg Chest Port 1 View  Result Date: 02/06/2019 CLINICAL DATA:  Intubation, central line EXAM: PORTABLE CHEST 1 VIEW COMPARISON:  Radiograph 02/05/2019 FINDINGS: Endotracheal tube in the mid trachea, 3 cm from the carina. Transesophageal tube tip and side port distal to the GE junction. Left PICC tip terminates at the superior cavoatrial junction. Extensive and worsening bilateral airspace disease with predominantly increasing perihilar opacity in the right lung. Stable cardiomegaly. No pneumothorax. Suspect small left effusion. No right effusion. No acute osseous or soft tissue abnormality. IMPRESSION: 1. Extensive and worsening bilateral airspace disease with predominantly increasing perihilar opacity in the right lung. 2. Satisfactory positioning of lines and tubes as above. Electronically Signed   By: Lovena Le M.D.   On: 02/06/2019 17:22   Dg Chest Port 1 View  Result Date: 02/05/2019 CLINICAL DATA:  Pneumonia due to COVID-19 virus. EXAM: PORTABLE CHEST 1 VIEW COMPARISON:  February 04, 2019. FINDINGS: Stable cardiomegaly. No pneumothorax or pleural effusion is noted. Stable bilateral lung opacities are noted. Bony thorax is unremarkable. IMPRESSION: Stable bilateral lung opacities are noted consistent with multifocal pneumonia. Electronically Signed   By: Marijo Conception M.D.   On: 02/05/2019 08:06   Dg Chest Port 1 View  Result Date: 02/04/2019 CLINICAL DATA:  COVID-19 EXAM: PORTABLE CHEST 1 VIEW COMPARISON:  02/03/2019 FINDINGS: Slight interval improvement in diffuse bilateral heterogeneous and interstitial airspace opacity, particularly in the left lung. No new airspace opacity. Cardiomegaly. IMPRESSION: 1. Slight interval improvement in diffuse bilateral heterogeneous and interstitial  airspace opacity, particularly in the left lung. Findings are consistent with improved infection. No new airspace opacity. 2.  Cardiomegaly. Electronically Signed   By: Eddie Candle M.D.   On: 02/04/2019 15:20   Dg Chest Port 1 View  Result Date: 02/03/2019 CLINICAL DATA:  Rapid response, COVID-19 EXAM: PORTABLE CHEST 1 VIEW COMPARISON:  02/02/2019 FINDINGS: Interval increase in heterogeneous airspace opacity, most conspicuous in the right lung base but also notable in the right upper lobe. Redemonstrated heterogeneous airspace opacity and consolidation of the left lung with a layering pleural effusion. Cardiomegaly. IMPRESSION: 1. Interval increase in heterogeneous airspace opacity, most conspicuous in the right lung base but also notable in the right upper lobe. Findings are consistent with worsened infection, edema, and/or ARDS. 2. Redemonstrated heterogeneous airspace opacity and consolidation of the left lung with a layering pleural effusion. 3.  Cardiomegaly. Electronically Signed   By: Eddie Candle M.D.   On: 02/03/2019 20:01   Dg Chest Port 1 View  Result Date: 02/02/2019 CLINICAL DATA:  COVID-19 positivity, follow-up infiltrates EXAM: PORTABLE CHEST 1 VIEW COMPARISON:  01/31/2019 FINDINGS: Patient is significantly rotated to the left. Cardiac shadow is stable. Endotracheal tube and gastric catheter is been removed in the interval. Patchy bilateral opacities are noted left greater than right relatively stable in appearance from the prior exam. No acute bony abnormality is noted. IMPRESSION: Persistent bilateral opacities. Interval removal of endotracheal tube and gastric catheter. Electronically Signed  By: Inez Catalina M.D.   On: 02/02/2019 08:08   Dg Chest Port 1 View  Result Date: 01/31/2019 CLINICAL DATA:  Intubated, COVID-19 pneumonia EXAM: PORTABLE CHEST 1 VIEW COMPARISON:  Chest radiograph from earlier today. FINDINGS: Left rotated chest radiograph. Endotracheal tube tip is 2.7 cm  above the carina. Enteric tube enters stomach with the tip not seen on this image. Stable cardiomediastinal silhouette with mild cardiomegaly. No pneumothorax. No pleural effusion. Extensive patchy opacities throughout both lungs, not appreciably changed. IMPRESSION: 1. Well-positioned support structures.  No pneumothorax. 2. Stable extensive patchy opacities throughout both lungs. 3. Stable mild cardiomegaly. Electronically Signed   By: Ilona Sorrel M.D.   On: 01/31/2019 14:08   Dg Chest Port 1 View  Result Date: 01/31/2019 CLINICAL DATA:  Respiratory distress EXAM: PORTABLE CHEST 1 VIEW COMPARISON:  Radiograph 01/29/2019 FINDINGS: Stable enlarged cardiac silhouette. There is bilateral patchy airspace disease again demonstrated. There is improvement in the RIGHT lower lobe airspace disease with decrease in density. Mild LEFT basilar atelectasis. No pneumothorax. Pleural fluid. IMPRESSION: Bilateral diffuse airspace disease with improvement in RIGHT lower lobe Electronically Signed   By: Suzy Bouchard M.D.   On: 01/31/2019 13:20   Dg Chest Port 1 View  Result Date: 01/29/2019 CLINICAL DATA:  Shortness of breath EXAM: PORTABLE CHEST 1 VIEW COMPARISON:  January 25, 2019 FINDINGS: Significant worsening of infiltrate in the right lung primarily centrally. Stable cardiomegaly. There is a background of pulmonary venous congestion suspected. No pneumothorax. IMPRESSION: Significant worsening of pulmonary infiltrates on the right. This is suspected to represent infection given the patient's positive COVID-19 status. There may be a background of mild pulmonary venous congestion as well. Electronically Signed   By: Dorise Bullion III M.D   On: 01/29/2019 08:25   Dg Chest Port 1 View  Result Date: 01/25/2019 CLINICAL DATA:  Fever. Shortness of breath. EXAM: PORTABLE CHEST 1 VIEW COMPARISON:  10/13/2018 FINDINGS: There is cardiomegaly with pulmonary vascular congestion. There is haziness in the left perihilar  region and in the right upper and lower lobes, probably representing mild pulmonary edema or possibly faint pneumonia. No discrete pleural effusions. No acute bone abnormality. Prominent degenerative changes of the right shoulder. IMPRESSION: 1. Hazy infiltrates in the left perihilar region and in the right upper and lower lobes. 2. Cardiomegaly with pulmonary vascular congestion. The hazy infiltrates could represent pulmonary edema or pneumonia. Electronically Signed   By: Lorriane Shire M.D.   On: 01/25/2019 14:46   Dg Knee Complete 4 Views Left  Result Date: 01/25/2019 CLINICAL DATA:  Fever. Soft tissue wounds on the left BKA stump. EXAM: LEFT KNEE - COMPLETE 4+ VIEW COMPARISON:  Radiographs dated 12/25/2017 FINDINGS: Surgical margins of the fibula and tibia are sharp with no radiographic evidence suggestive of osteomyelitis. No significant arthritic changes of the knee. Vascular stent in the distal left thigh. IMPRESSION: No significant abnormality. Specifically, no evidence of osteomyelitis in the tibia or fibula. Electronically Signed   By: Lorriane Shire M.D.   On: 01/25/2019 14:48   Vas [place] Burnard Bunting With/wo Tbi  Result Date: 01/16/2019 LOWER EXTREMITY DOPPLER STUDY Indications: Peripheral artery disease, and Left leg venous stasis ulcers and              left BKA.  Comparison Study: 05/31/2018 Performing Technologist: Concha Norway RVT  Examination Guidelines: A complete evaluation includes at minimum, Doppler waveform signals and systolic blood pressure reading at the level of bilateral brachial, anterior tibial, and posterior tibial arteries, when  vessel segments are accessible. Bilateral testing is considered an integral part of a complete examination. Photoelectric Plethysmograph (PPG) waveforms and toe systolic pressure readings are included as required and additional duplex testing as needed. Limited examinations for reoccurring indications may be performed as noted.  ABI Findings:  +---------+------------------+-----+----------+--------+ Right    Rt Pressure (mmHg)IndexWaveform  Comment  +---------+------------------+-----+----------+--------+ Brachial 157                                       +---------+------------------+-----+----------+--------+ ATA      140               0.89 monophasic         +---------+------------------+-----+----------+--------+ PTA      166               1.06 biphasic           +---------+------------------+-----+----------+--------+ Great Toe128               0.82 Normal             +---------+------------------+-----+----------+--------+ +--------+------------------+-----+--------+-------+ Left    Lt Pressure (mmHg)IndexWaveformComment +--------+------------------+-----+--------+-------+ [id number]                                    +--------+------------------+-----+--------+-------+ ATA                                    BKA     +--------+------------------+-----+--------+-------+ PTA                                    BKA     +--------+------------------+-----+--------+-------+ +-------+-----------+-----------+------------+------------+ ABI/TBIToday's ABIToday's TBIPrevious ABIPrevious TBI +-------+-----------+-----------+------------+------------+ Right  1.06       .82                                 +-------+-----------+-----------+------------+------------+ Left   BKA                                            +-------+-----------+-----------+------------+------------+ Right ABIs appear essentially unchanged compared to prior study on 05/31/2018.  Summary: Right: Resting right ankle-brachial index is within normal range. No evidence of significant right lower extremity arterial disease. The right toe-brachial index is normal. Left: BKA.  *See table(s) above for measurements and observations.  Electronically signed by Leotis Pain MD on 01/16/2019 at 4:53:34 PM.   Final      Microbiology Recent Results (from the past 240 hour(s))  MRSA PCR Screening     Status: None   Collection Time: 02/04/19  5:55 AM   Specimen: Nasopharyngeal  Result Value Ref Range Status   MRSA by PCR NEGATIVE NEGATIVE Final    Comment:        The GeneXpert MRSA Assay (FDA approved for NASAL specimens only), is one component of a comprehensive MRSA colonization surveillance program. It is not intended to diagnose MRSA infection nor to guide or monitor treatment for MRSA infections. Performed at [facility], [place] [address], [place], [place] [address]  Culture, blood (routine x 2)     Status: None (Preliminary result)   Collection Time: 02/06/19  7:14 PM   Specimen: BLOOD  Result Value Ref Range Status   Specimen Description   Final    BLOOD Performed at [place] [address], [place], [place] [address]    Special Requests   Final    BOTTLES DRAWN AEROBIC ONLY Blood Culture adequate volume Performed at [place] [address] [place], [place] [address]    Culture   Final    NO GROWTH 3 DAYS Performed at [facility], [place] [address] [place], [place] [address]    Report Status PENDING  Incomplete  Culture, Urine     Status: Abnormal   Collection Time: 02/06/19  7:15 PM   Specimen: Urine, Catheterized  Result Value Ref Range Status   Specimen Description   Final    URINE, CATHETERIZED Performed at [place] [address], [place], [place] [address]    Special Requests   Final    NONE Performed at [facility], [place] [address], [place], [place] [address]    Culture MULTIPLE SPECIES PRESENT, SUGGEST RECOLLECTION (A)  Final   Report Status 02/07/2019 FINAL  Final  Culture, blood (routine x 2)     Status: None (Preliminary result)   Collection Time: 02/06/19  7:19 PM   Specimen: BLOOD  Result Value Ref Range Status   Specimen Description   Final     BLOOD Performed at [place] [address] [place], [place] [address]    Special Requests   Final    BOTTLES DRAWN AEROBIC ONLY Blood Culture adequate volume Performed at [place] [address], [place], [place] [address]    Culture   Final    NO GROWTH 3 DAYS Performed at [facility], [place] [address] [place], [place] [address]    Report Status PENDING  Incomplete  Culture, blood (Routine X 2) w Reflex to ID Panel     Status: None (Preliminary result)   Collection Time: 03/02/19  2:26 PM   Specimen: BLOOD  Result Value Ref Range Status   Specimen Description   Final    BLOOD RIGHT ANTECUBITAL Performed at [facility], [place] [address], [place], [place] [address]    Special Requests   Final    BOTTLES DRAWN AEROBIC ONLY Blood Culture adequate volume Performed at [place] [address], [place], [place] [address]    Culture PENDING  Incomplete   Report Status PENDING  Incomplete  Culture, respiratory (non-expectorated)     Status: None (Preliminary result)   Collection Time: 03/02/19  4:45 PM   Specimen: Tracheal Aspirate; Respiratory  Result Value Ref Range Status   Specimen Description   Final    TRACHEAL ASPIRATE Performed at [place] [address], [place], [place] [address]    Special Requests   Final    NONE Performed at [facility], [place] [address], [place], [place] [address]    Gram Stain   Final    ABUNDANT WBC PRESENT,BOTH PMN AND MONONUCLEAR ABUNDANT GRAM NEGATIVE RODS RARE GRAM POSITIVE COCCI RARE GRAM VARIABLE ROD Performed at [facility], [place] [address], [place], [place] [address]    Culture PENDING  Incomplete   Report Status PENDING  Incomplete    Lab Basic Metabolic Panel: Recent Labs  Lab 02/04/19 0530 02/05/19 0543  02/07/19 0430 02/07/19 0525 02/07/19 1602 02/08/19 0331 02/08/19 0548 02/08/19 1810  02/21/19 0326 2019-02-21 0425  NA 145 139   < > 147* 144  --  149* 146*  --  144 143  K 4.1 4.4   < > 4.3 4.1  --  4.3 4.2  --  3.7 3.7  CL 109 103  --  110  --   --   --  114*  --   --  107  CO2 26 26  --  23  --   --   --  24  --   --  27  GLUCOSE 188* 243*  --  235*  --   --   --  374*  --   --  401*  BUN 44* 39*  --  51*  --   --   --  73*  --   --  96*  CREATININE 1.22 1.11  --  1.31*  --   --   --  1.50*  --   --  1.74*  CALCIUM 8.7* 8.8*  --  8.6*  --   --   --  9.0  --   --  7.8*  MG  --   --   --  2.4  --  2.4  --  2.9* 0.9*  --  2.9*  PHOS  --   --   --  4.2  --  3.8  --  2.0* <1.0*  --  4.7*   < > = values in this interval not displayed.   Liver Function Tests: Recent Labs  Lab 02/04/19 0530  AST 10*  ALT 16  ALKPHOS 52  BILITOT 1.1  PROT 5.2*  ALBUMIN 2.5*   CBC: Recent Labs  Lab 02/04/19 0530 02/05/19 0543  02/07/19 0430 02/07/19 0525 02/08/19 0331 02/08/19 0548 02-21-19 0326 2019-02-21 0425  WBC 10.0 10.2  --  14.8*  --   --  14.1*  --  18.7*  HGB 13.5 13.5   < > 13.5 13.3 13.6 13.9 13.3 13.0  HCT 42.6 43.7   < > 44.5 39.0 40.0 45.9 39.0 43.5  MCV 91.2 90.3  --  91.8  --   --  91.6  --  92.8  PLT 190 175  --  201  --   --  158  --  116*   < > = values in this interval not displayed.   Sepsis Labs: Recent Labs  Lab 02/05/19 0543 02/07/19 0430 02/08/19 0548 2019-02-21 0425 02-21-2019 1820  PROCALCITON  --   --   --   --  0.99  WBC 10.2 14.8* 14.1* 18.7*  --      Jalyah Weinheimer 02/10/2019, 7:06 AM

## 2019-02-11 NOTE — Progress Notes (Signed)
NAME:  Shawn Wiler., MRN:  [id number], DOB:  10-Dec-1931, LOS: 33 ADMISSION DATE:  01/19/2019, CONSULTATION DATE:  10/21 REFERRING MD:  Waldron Labs, CHIEF COMPLAINT:  Dyspnea   Brief History   85 year old male admitted on October 17 for Covid pneumonia, treated with steroids and remdesivir, moved to the intensive care unit on October 21 in the setting of worsening respiratory failure without significant hypoxemia.  Past Medical History  Neuropathy History of nephrolithiasis Hypertension Hyperlipidemia GERD Diabetes mellitus type 2 History of stroke in 2016 and 1999 COPD on as needed oxygen at home Chronic diastolic heart failure, 3474 TTE> LVEF 65% on RV normal, valves OK Chronic atrial fibrillation Coronary artery disease  Significant Hospital Events   10/17 admission, steroids, remdesivir 10/18-20, several days of intermittent lethargy and dyspnea 10/21 dyspnea, worsenign lethargy moved to ICU for intubation 10/22 extubation, BIPAP overnight 10/23 more awake and alert after BIPAP, transitioned to nightly heated high flow to minimize aerosolization risk 10/23 awake and alert, not dyspneic 10/24 moved to PCU, then moved back to ICU that evening for increased work of breathing, wheezing placed on BIPAP with good result  Consults:  PCCM  Procedures:  10/17 ETT > 10/22  Significant Diagnostic Tests:    Micro Data:  10/15 SARS COV 2 > positive 10/16 blood >  10/15 urine >   Antimicrobials/COVID Rx:  10/15 cefepime > 10/17 10/15 flagyl > 10/16 10/15 zosyn  10/16 remdesivir > 10/20 10/15 vancomycin x 1   Interim history/subjective:   FiO2 to 50 and PEEP 8 Mental status altered overnight  Objective   Blood pressure 112/63, pulse (!) 107, temperature (!) 100.4 F (38 C), resp. rate (!) 23, height 5\' 6"  (1.676 m), weight 82.5 kg, SpO2 94 %. CVP:  [0 mmHg-9 mmHg] 1 mmHg  Vent Mode: PRVC FiO2 (%):  [50 %-60 %] 50 % Set Rate:  [20 bmp] 20 bmp Vt Set:  [500 mL]  500 mL PEEP:  [8 cmH20] 8 cmH20 Plateau Pressure:  [20 cmH20-26 cmH20] 26 cmH20   Intake/Output Summary (Last 24 hours) at 2019/03/02 2595 Last data filed at 2019-03-02 0800 Gross per 24 hour  Intake 3948.71 ml  Output 2505 ml  Net 1443.71 ml   Filed Weights   01/19/2019 1413 Mar 02, 2019 0500  Weight: 80 kg 82.5 kg   Examination:  General:  Chronically ill appearing male, NAD, sedated HENT: [place]/AT, PERRL, EOM-I and MMM, ETT in place PULM: Coarse BS diffusely CV: RRR, Nl S1/S2 and -M/R/G GI: Soft, NT, ND and +BS MSK: -edema and -tenderness Neuro: Sedate but withdraws to pain  I reviewed CXR myself, ETT is in a good position and infiltrate noted  Discussed with [facility]-MD  Resolved Hospital Problem list     Assessment & Plan:  Acute respiratory failure due to increase work of breathing again 10/24> bronchospasm vs vocal cord dysfunction (favor the latter) as these episodes occur after drinking liquids COVID pneumonia  Doubt CHF as net negative Maintain intubated for now PS tirals as tolerated but no extubation given mental status Will communicate with patient's family today again given mental status Hold further diureses Titrate O2 for sat of 85-92% Adjust PEEP and FiO2 accordingly Continue steroids for now solumedrol 40 q8 Continue celexa and prn anxiolytics for [facility] management Continue pantoprazole for GERD  Titrate O2 for sat of 85%  COPD baseline, mild exacerbation? Combivent QID Increased steroids to 40 q8  Will need to communicate with family regarding plan of care  PCCM will continue  to follow  Best practice:  Diet: tube feeding Pain/Anxiety/Delirium protocol (if indicated): n/a VAP protocol (if indicated): n/a DVT prophylaxis: full dose lovenox GI prophylaxis: pantoprazole daily Glucose control: SSI Mobility: bed rest Code Status: FULL Family Communication: per [facility] Disposition: consider LTAC placement  Labs   CBC: Recent Labs  Lab 02/04/19 0530  02/05/19 0543  02/07/19 0430 02/07/19 0525 02/08/19 0331 02/08/19 0548 02/20/19 0326 02/20/19 0425  WBC 10.0 10.2  --  14.8*  --   --  14.1*  --  18.7*  HGB 13.5 13.5   < > 13.5 13.3 13.6 13.9 13.3 13.0  HCT 42.6 43.7   < > 44.5 39.0 40.0 45.9 39.0 43.5  MCV 91.2 90.3  --  91.8  --   --  91.6  --  92.8  PLT 190 175  --  201  --   --  158  --  116*   < > = values in this interval not displayed.    Basic Metabolic Panel: Recent Labs  Lab 02/04/19 0530 02/05/19 0543  02/07/19 0430 02/07/19 0525 02/07/19 1602 02/08/19 0331 02/08/19 0548 02/08/19 1810 Feb 20, 2019 0326 02/20/19 0425  NA 145 139   < > 147* 144  --  149* 146*  --  144 143  K 4.1 4.4   < > 4.3 4.1  --  4.3 4.2  --  3.7 3.7  CL 109 103  --  110  --   --   --  114*  --   --  107  CO2 26 26  --  23  --   --   --  24  --   --  27  GLUCOSE 188* 243*  --  235*  --   --   --  374*  --   --  401*  BUN 44* 39*  --  51*  --   --   --  73*  --   --  96*  CREATININE 1.22 1.11  --  1.31*  --   --   --  1.50*  --   --  1.74*  CALCIUM 8.7* 8.8*  --  8.6*  --   --   --  9.0  --   --  7.8*  MG  --   --   --  2.4  --  2.4  --  2.9* 0.9*  --  2.9*  PHOS  --   --   --  4.2  --  3.8  --  2.0* <1.0*  --  4.7*   < > = values in this interval not displayed.   GFR: Estimated Creatinine Clearance: 31.3 mL/min (A) (by C-G formula based on SCr of 1.74 mg/dL (H)). Recent Labs  Lab 02/05/19 0543 02/07/19 0430 02/08/19 0548 2019-02-20 0425  WBC 10.2 14.8* 14.1* 18.7*    Liver Function Tests: Recent Labs  Lab 02/03/19 0445 02/04/19 0530  AST 13* 10*  ALT 15 16  ALKPHOS 54 52  BILITOT 1.1 1.1  PROT 5.3* 5.2*  ALBUMIN 2.7* 2.5*   No results for input(s): LIPASE, AMYLASE in the last 168 hours. No results for input(s): AMMONIA in the last 168 hours.  ABG    Component Value Date/Time   PHART 7.337 (L) February 20, 2019 0326   PCO2ART 46.0 February 20, 2019 0326   PO2ART 52.0 (L) February 20, 2019 0326   HCO3 24.7 2019-02-20 0326   TCO2 26  02-20-2019 0326   ACIDBASEDEF 1.0 Feb 20, 2019 0326   O2SAT 84.0 February 20, 2019  0326     Coagulation Profile: No results for input(s): INR, PROTIME in the last 168 hours.  Cardiac Enzymes: No results for input(s): CKTOTAL, CKMB, CKMBINDEX, TROPONINI in the last 168 hours.  HbA1C: Hemoglobin A1C  Date/Time Value Ref Range Status  04/23/2014 04:16 AM 12.1 (H) 4.2 - 6.3 % Final    Comment:    The American Diabetes Association recommends that a primary goal of therapy should be <7% and that physicians should reevaluate the treatment regimen in patients with HbA1c values consistently >8%.    Hgb A1c MFr Bld  Date/Time Value Ref Range Status  01/26/2019 03:58 AM 6.7 (H) 4.8 - 5.6 % Final    Comment:    (NOTE) Pre diabetes:          5.7%-6.4% Diabetes:              >6.4% Glycemic control for   <7.0% adults with diabetes   09/11/2018 03:05 AM 7.4 (H) 4.8 - 5.6 % Final    Comment:    (NOTE) Pre diabetes:          5.7%-6.4% Diabetes:              >6.4% Glycemic control for   <7.0% adults with diabetes     CBG: Recent Labs  Lab 02/08/19 1959 02/08/19 2133 2019-03-06 0022 03-06-19 0348 March 06, 2019 0751  GLUCAP 217* 254* 325* 340* 360*   The patient is critically ill with multiple organ systems failure and requires high complexity decision making for assessment and support, frequent evaluation and titration of therapies, application of advanced monitoring technologies and extensive interpretation of multiple databases.   Critical Care Time devoted to patient care services described in this note is  35  Minutes. This time reflects time of care of this signee Dr [name]. This critical care time does not reflect procedure time, or teaching time or supervisory time of PA/NP/Med student/Med Resident etc but could involve care discussion time.  Rush Farmer, M.D. [facility] Pulmonary/Critical Care Medicine.

## 2019-02-11 DEATH — deceased

## 2019-02-12 LAB — CULTURE, BLOOD (ROUTINE X 2): Special Requests: ADEQUATE

## 2019-02-14 LAB — CULTURE, BLOOD (ROUTINE X 2)
Culture: NO GROWTH
Special Requests: ADEQUATE

## 2019-02-15 ENCOUNTER — Telehealth (INDEPENDENT_AMBULATORY_CARE_PROVIDER_SITE_OTHER): Payer: Self-pay | Admitting: Vascular Surgery

## 2019-02-15 ENCOUNTER — Telehealth: Payer: Self-pay | Admitting: Internal Medicine

## 2019-02-15 NOTE — Telephone Encounter (Signed)
[name], Daughter called to thank you guys for the care that you gave the patient and that they appreciate everything you done for him

## 2019-02-15 NOTE — Telephone Encounter (Signed)
Noted, sympathy card mailed today

## 2019-02-21 ENCOUNTER — Ambulatory Visit: Payer: Medicare Other | Admitting: Cardiovascular Disease

## 2019-03-26 ENCOUNTER — Ambulatory Visit: Payer: Medicare Other

## 2019-04-02 ENCOUNTER — Ambulatory Visit: Payer: Medicare Other | Admitting: Radiation Oncology

## 2019-04-09 ENCOUNTER — Ambulatory Visit: Payer: Medicare Other | Admitting: Radiation Oncology

## 2019-07-13 ENCOUNTER — Ambulatory Visit (INDEPENDENT_AMBULATORY_CARE_PROVIDER_SITE_OTHER): Payer: Medicare Other | Admitting: Vascular Surgery

## 2019-07-13 ENCOUNTER — Encounter (INDEPENDENT_AMBULATORY_CARE_PROVIDER_SITE_OTHER): Payer: Medicare Other

## 2019-10-13 IMAGING — CT CT ABDOMEN AND PELVIS WITHOUT CONTRAST
2 of 4 series · 16 of 46 positions shown, 18 images · non-contrast
Comparison: Radiography 2 days ago.

CLINICAL DATA: Constipation and nausea.  Question obstruction.

EXAM:
CT ABDOMEN AND PELVIS WITHOUT CONTRAST
TECHNIQUE: Multidetector CT imaging of the abdomen and pelvis was performed
following the standard protocol without IV contrast.

[Series 2: routine abd/pel wo · axial · 0.82mm/px · z∈[-498,-73]mm · 13 of 93 slices shown, 15 images]
[im 4/93  soft-tissue]
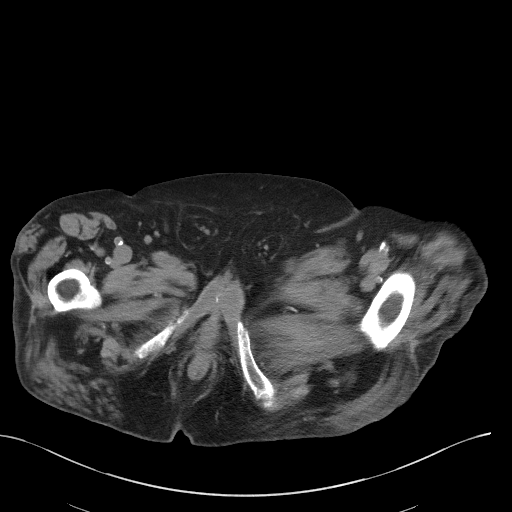
[im 4/93  bone]
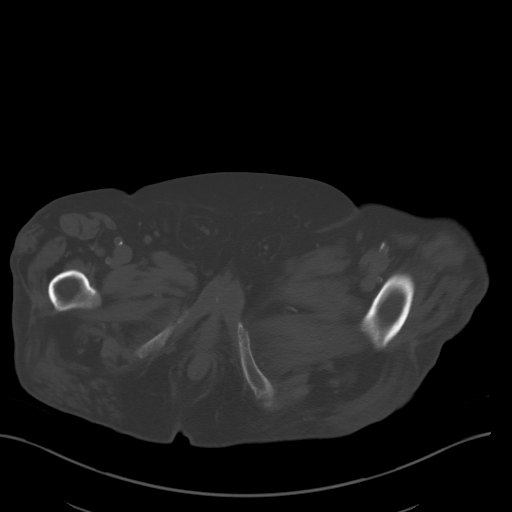
[im 12/93  soft-tissue]
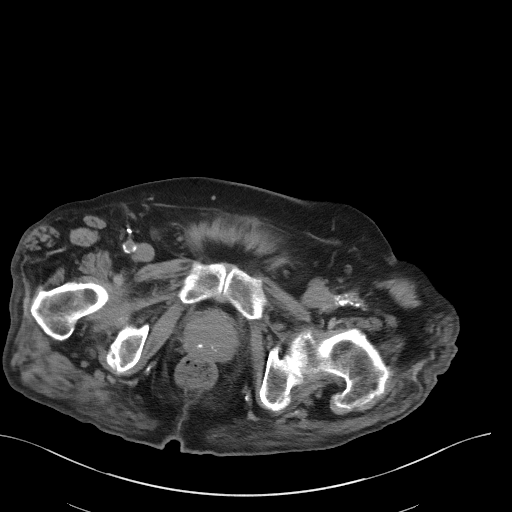
[im 20/93  soft-tissue]
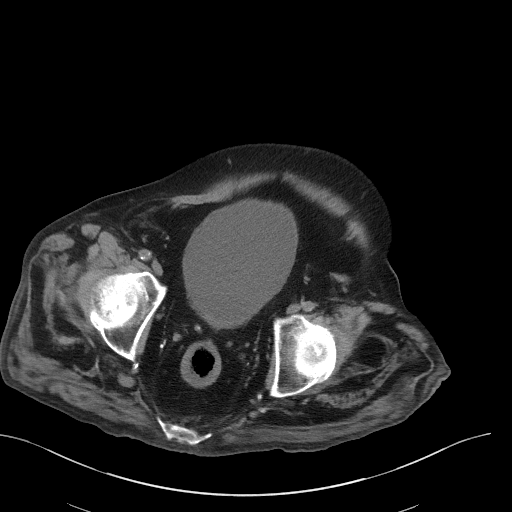
[im 27/93  soft-tissue]
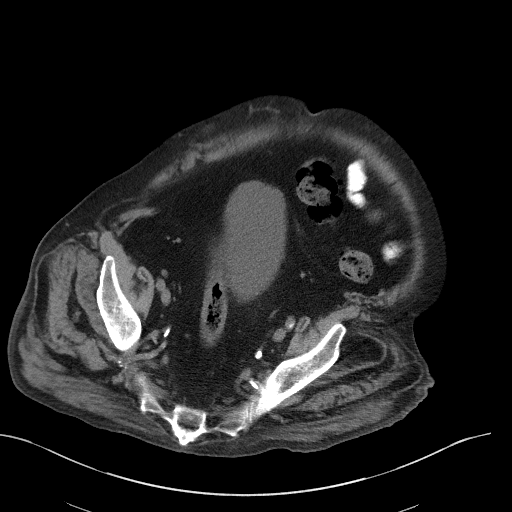
[im 31/93  soft-tissue]
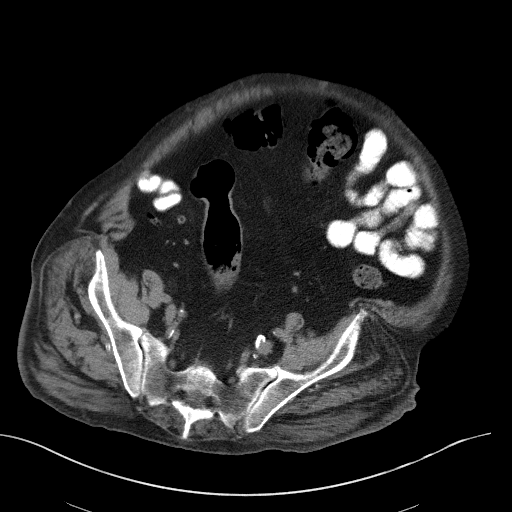
[im 39/93  soft-tissue]
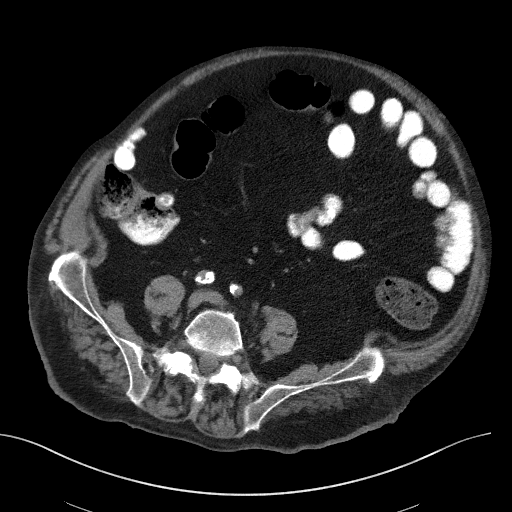
[im 47/93  soft-tissue]
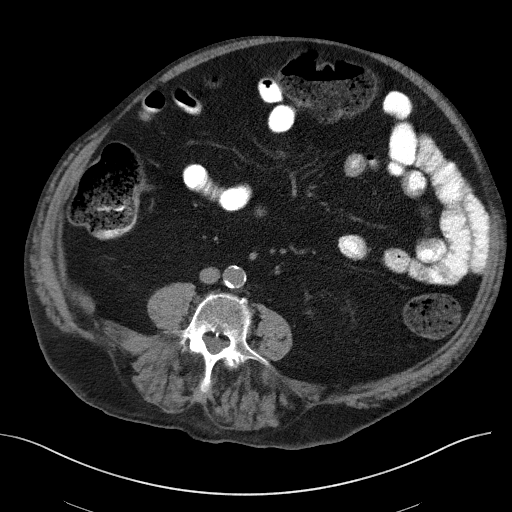
[im 54/93  soft-tissue]
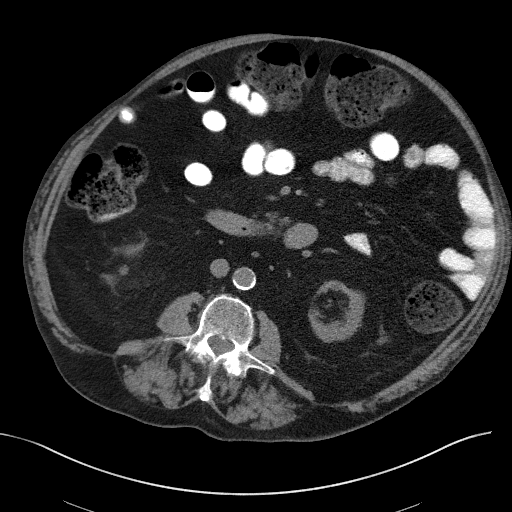
[im 62/93  soft-tissue]
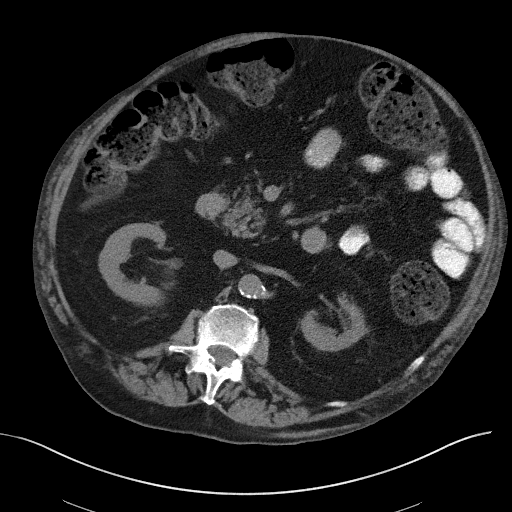
[im 62/93  bone]
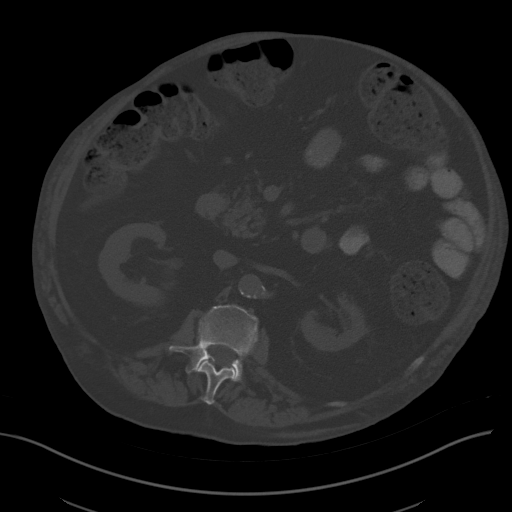
[im 66/93  soft-tissue]
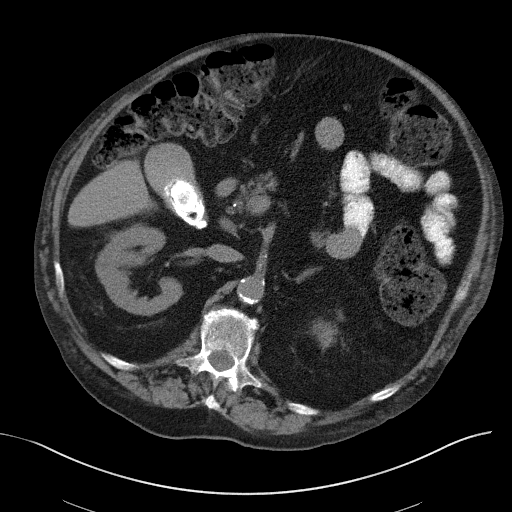
[im 73/93  soft-tissue]
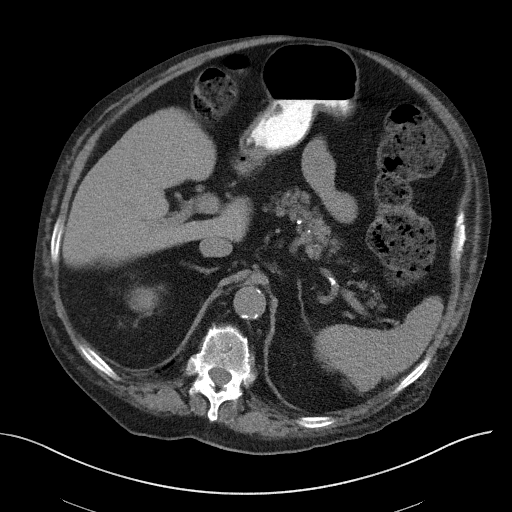
[im 81/93  soft-tissue]
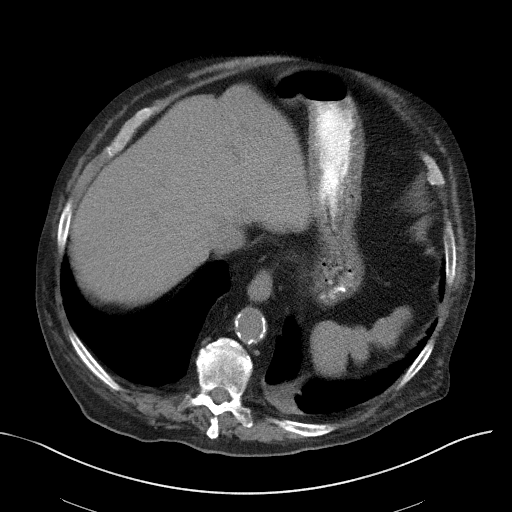
[im 89/93  soft-tissue]
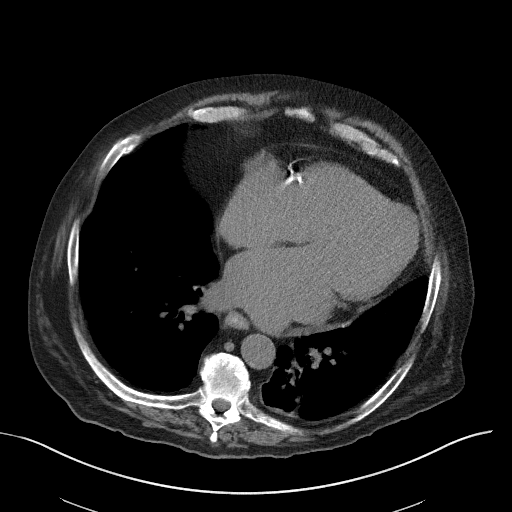

[Series 5: coronal st · coronal · 0.79mm/px · 3 of 121 slices shown]
[im 41/121  soft-tissue]
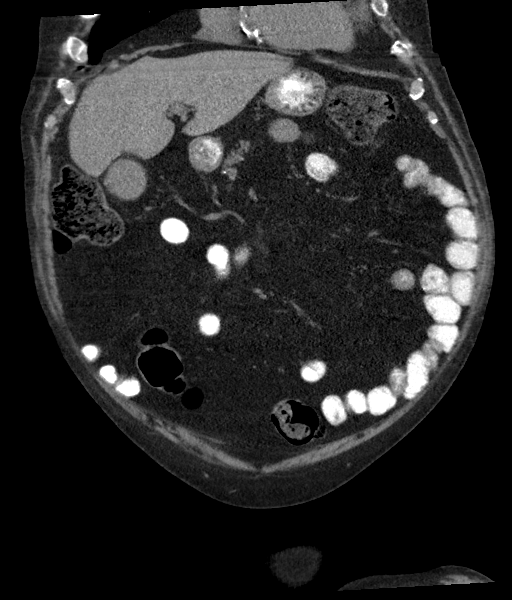
[im 54/121  soft-tissue]
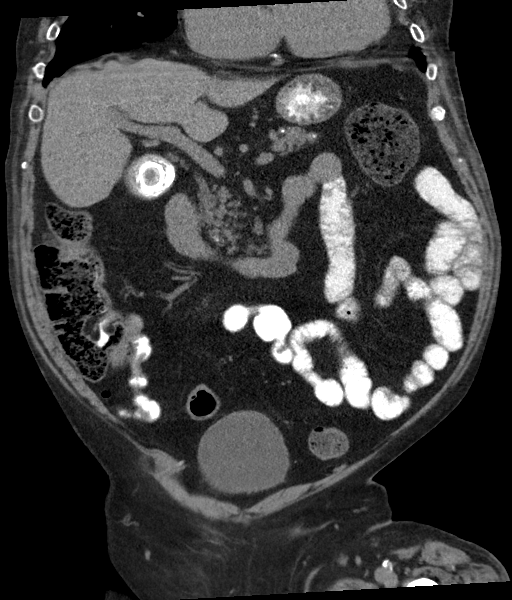
[im 67/121  soft-tissue]
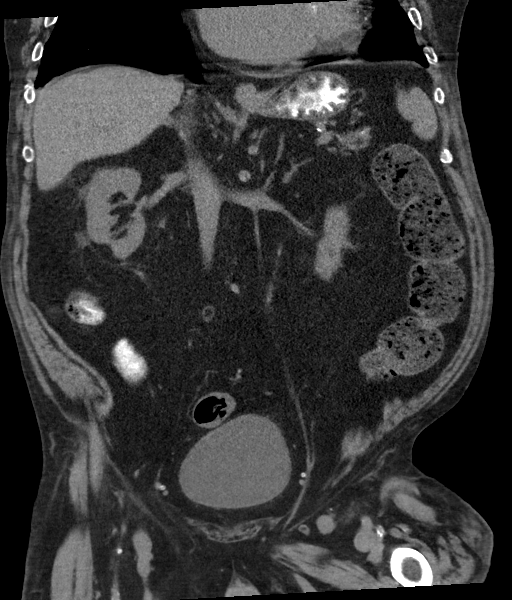

[16 of 46 positions shown; findings below may reference images not displayed]

CT 06/03/2014 right kidney
shows atrophic change, progressive over time. Multiple stones
present in the lower pole. No hydronephrosis. Bladder is normal.
FINDINGS: Lower chest: Consolidation in the posteromedial left lower lobe
consistent with bronchopneumonia. No pleural fluid. Right lung is
clear.

Hepatobiliary: Liver parenchyma appears normal without contrast.
There are multiple calcified gallstones dependent in the
gallbladder, some measuring at least 2 cm in size. No CT evidence of
cholecystitis or obstruction.

Pancreas: Diffuse pancreatic parenchymal calcifications. No evidence
of pancreatic mass or active inflammation.

Spleen: Normal

Adrenals/Urinary Tract: Adrenal glands are normal. Right kidney
shows mild atrophy. There appears to be a diverticulum extending
from the renal pelvis on the right. This is unchanged from previous
studies.

Stomach/Bowel: Stomach appears normal. Small bowel is normal.
Appendix is normal. The colon contains stool and gas but is not
abnormally dilated. No sign of diverticulitis.

Vascular/Lymphatic: Aortic atherosclerosis. No aneurysm. IVC is
normal. No adenopathy.

Reproductive: Normal

Other: No free fluid or air.

Musculoskeletal: Lower lumbar degenerative changes including
degenerative anterolisthesis 4 mm at L4-5.
IMPRESSION: No evidence of bowel obstruction. Small bowel is normal. The colon
does contain a fairly large amount of stool and gas but the pattern
does not suggest obstruction.

Left lower lobe pneumonia at the posteromedial base.

Chololithiasis without CT evidence of cholecystitis.

Progressive renal volume loss, left more than right. Multiple
nonobstructing stones in the lower pole of the left kidney.

Aortic atherosclerosis.

## 2019-10-14 IMAGING — CR CHEST - 2 VIEW
2 series · 2 of 2 positions shown · non-contrast
Comparison: Chest x-ray dated October 10, 2018.

CLINICAL DATA: Hypoxia.

EXAM:
CHEST - 2 VIEW

[chest lat]
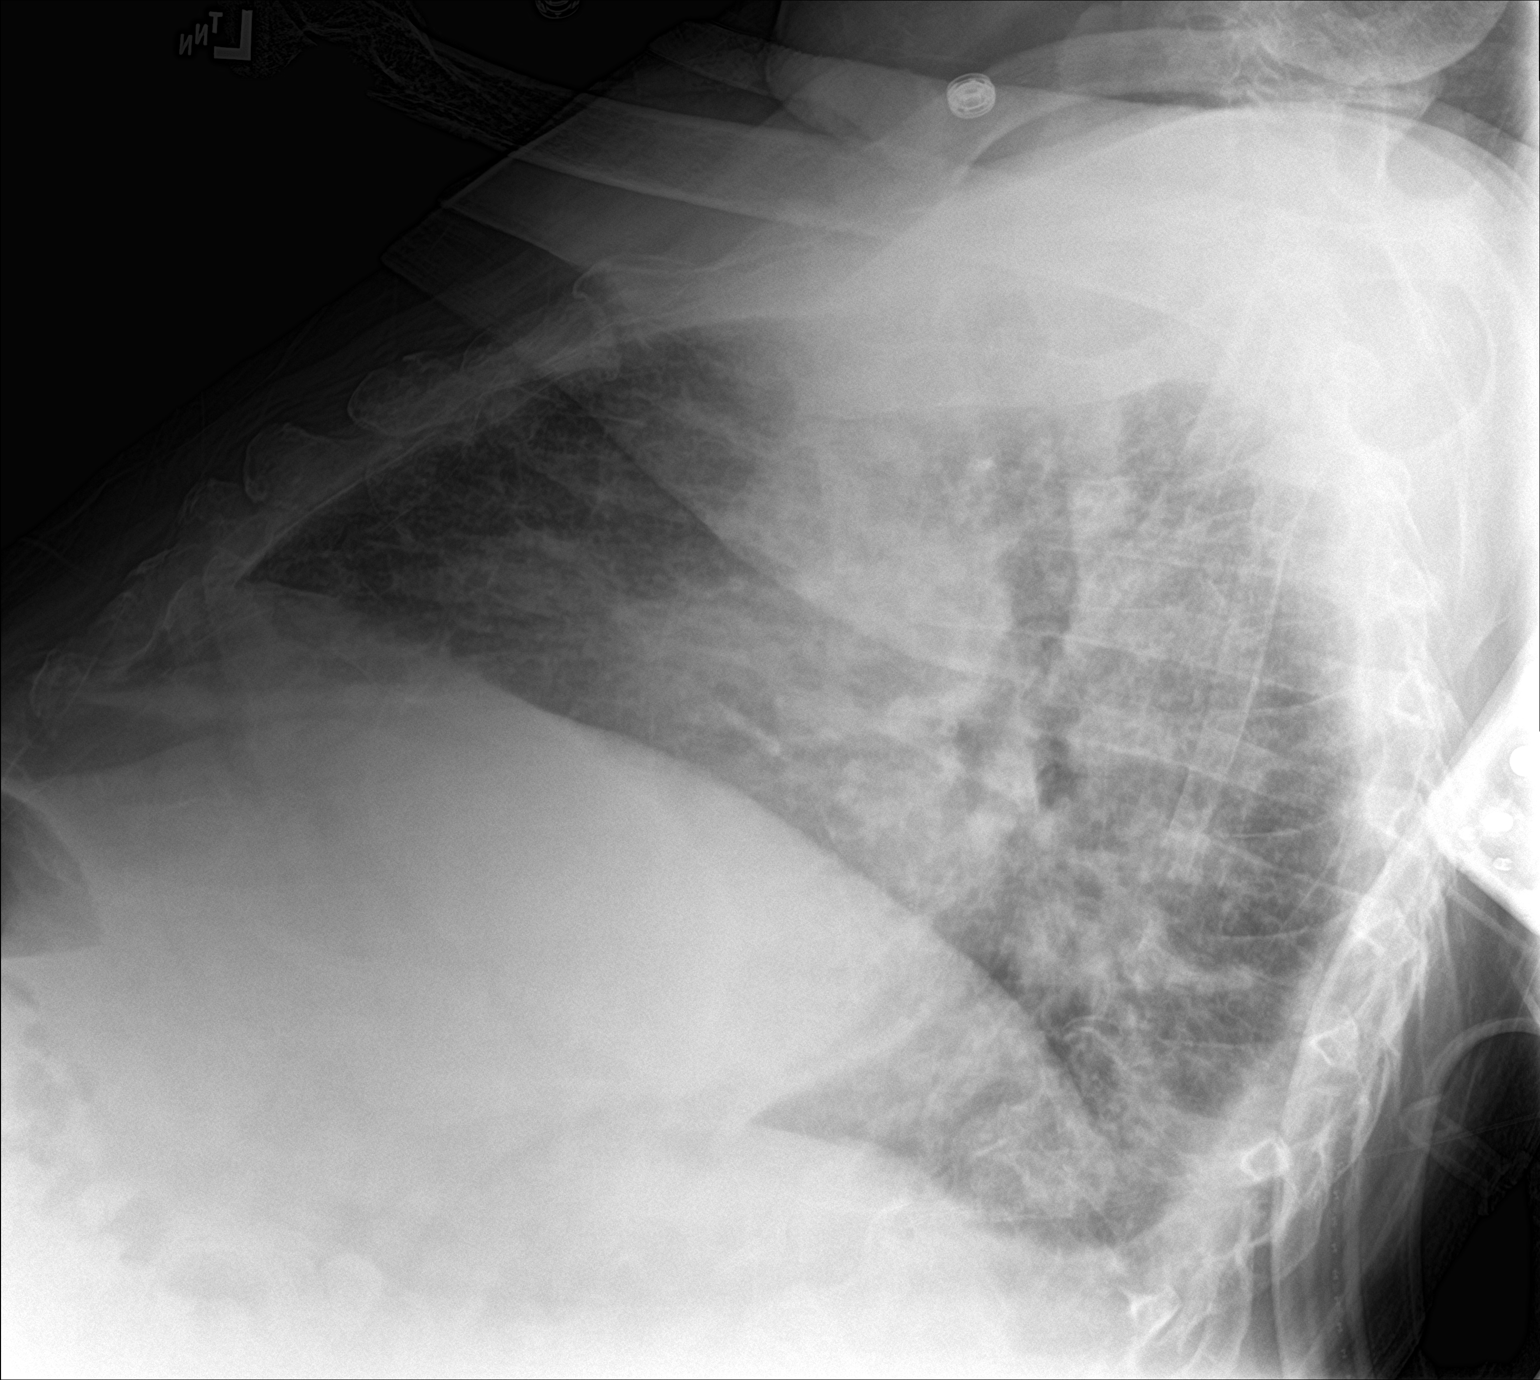

[chest ap]
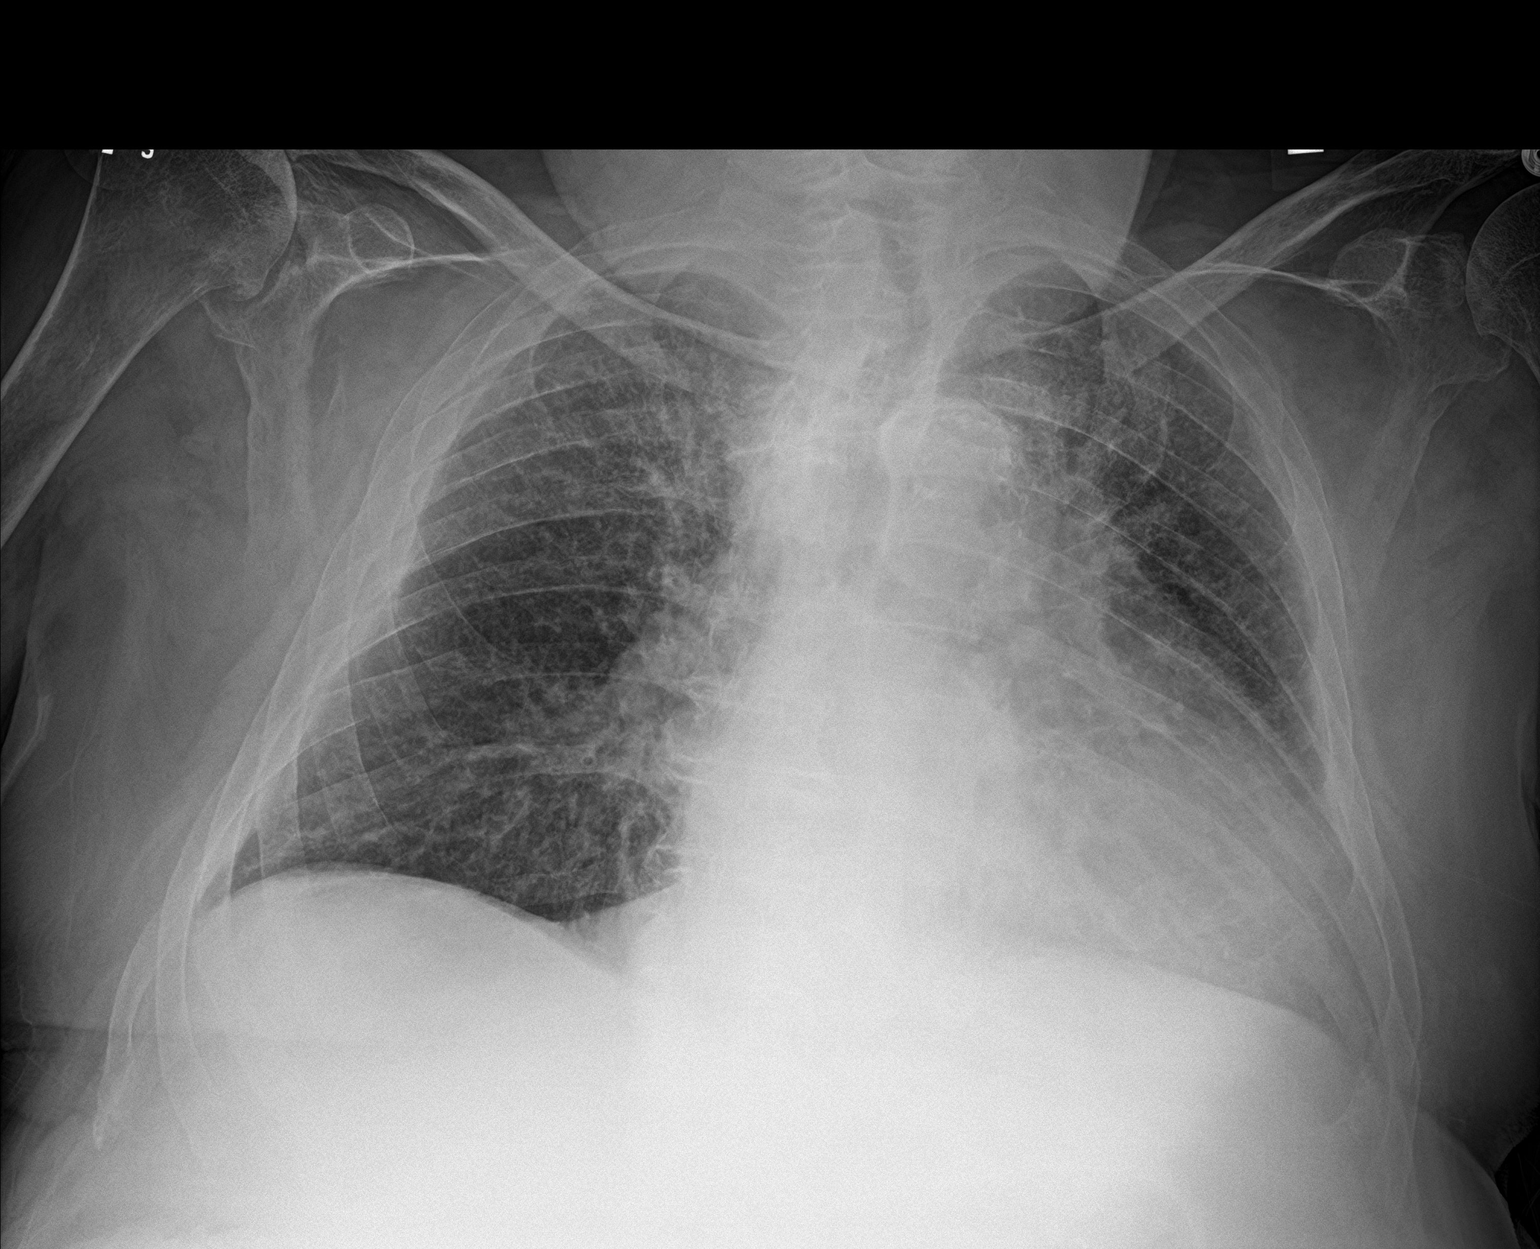

[2 of 2 positions shown; findings below may reference images not displayed]

FINDINGS: Stable cardiomegaly. Improved interstitial edema. No focal
consolidation, pleural effusion, or pneumothorax. No acute osseous
abnormality.
IMPRESSION: Improved interstitial edema.
